# Patient Record
Sex: Female | Born: 1950
Health system: Southern US, Community
[De-identification: ages and names within clinical notes are randomized; demographics above are authoritative.]

## PROBLEM LIST (undated history)

## (undated) DIAGNOSIS — G4700 Insomnia, unspecified: Secondary | ICD-10-CM

## (undated) DIAGNOSIS — F32A Depression, unspecified: Secondary | ICD-10-CM

## (undated) DIAGNOSIS — D849 Immunodeficiency, unspecified: Secondary | ICD-10-CM

## (undated) DIAGNOSIS — R339 Retention of urine, unspecified: Secondary | ICD-10-CM

## (undated) DIAGNOSIS — R498 Other voice and resonance disorders: Secondary | ICD-10-CM

## (undated) DIAGNOSIS — K219 Gastro-esophageal reflux disease without esophagitis: Secondary | ICD-10-CM

## (undated) DIAGNOSIS — G709 Myoneural disorder, unspecified: Secondary | ICD-10-CM

## (undated) DIAGNOSIS — K449 Diaphragmatic hernia without obstruction or gangrene: Secondary | ICD-10-CM

## (undated) DIAGNOSIS — J449 Chronic obstructive pulmonary disease, unspecified: Secondary | ICD-10-CM

## (undated) DIAGNOSIS — J3802 Paralysis of vocal cords and larynx, bilateral: Secondary | ICD-10-CM

## (undated) DIAGNOSIS — G4730 Sleep apnea, unspecified: Secondary | ICD-10-CM

## (undated) DIAGNOSIS — J45909 Unspecified asthma, uncomplicated: Secondary | ICD-10-CM

## (undated) DIAGNOSIS — M810 Age-related osteoporosis without current pathological fracture: Secondary | ICD-10-CM

## (undated) DIAGNOSIS — J479 Bronchiectasis, uncomplicated: Secondary | ICD-10-CM

## (undated) DIAGNOSIS — E039 Hypothyroidism, unspecified: Secondary | ICD-10-CM

## (undated) DIAGNOSIS — J189 Pneumonia, unspecified organism: Secondary | ICD-10-CM

## (undated) DIAGNOSIS — J383 Other diseases of vocal cords: Secondary | ICD-10-CM

## (undated) DIAGNOSIS — M1990 Unspecified osteoarthritis, unspecified site: Secondary | ICD-10-CM

## (undated) DIAGNOSIS — G35 Multiple sclerosis: Secondary | ICD-10-CM

## (undated) DIAGNOSIS — C539 Malignant neoplasm of cervix uteri, unspecified: Secondary | ICD-10-CM

## (undated) HISTORY — DX: Malignant neoplasm of cervix uteri, unspecified: C53.9

## (undated) HISTORY — PX: SMALL INTESTINE SURGERY: SHX150

## (undated) HISTORY — DX: Myoneural disorder, unspecified: G70.9

## (undated) HISTORY — PX: EYE SURGERY: SHX253

## (undated) HISTORY — DX: Other diseases of vocal cords: J38.3

## (undated) HISTORY — PX: CATARACT EXTRACTION, BILATERAL: SHX1313

## (undated) HISTORY — DX: Multiple sclerosis: G35

## (undated) HISTORY — DX: Hypothyroidism, unspecified: E03.9

## (undated) HISTORY — DX: Other voice and resonance disorders: R49.8

---

## 1994-03-24 DIAGNOSIS — G35 Multiple sclerosis: Secondary | ICD-10-CM

## 1994-03-24 HISTORY — DX: Multiple sclerosis: G35

## 1999-10-09 ENCOUNTER — Ambulatory Visit (HOSPITAL_COMMUNITY): Admission: RE | Admit: 1999-10-09 | Discharge: 1999-10-09 | Payer: Self-pay | Admitting: Gastroenterology

## 1999-10-09 ENCOUNTER — Encounter: Payer: Self-pay | Admitting: Gastroenterology

## 1999-11-26 ENCOUNTER — Ambulatory Visit (HOSPITAL_COMMUNITY): Admission: RE | Admit: 1999-11-26 | Discharge: 1999-11-26 | Payer: Self-pay | Admitting: Gastroenterology

## 2000-04-17 ENCOUNTER — Encounter: Payer: Self-pay | Admitting: *Deleted

## 2000-04-17 ENCOUNTER — Encounter: Admission: RE | Admit: 2000-04-17 | Discharge: 2000-04-17 | Payer: Self-pay | Admitting: *Deleted

## 2000-06-25 ENCOUNTER — Other Ambulatory Visit: Admission: RE | Admit: 2000-06-25 | Discharge: 2000-06-25 | Payer: Self-pay | Admitting: *Deleted

## 2001-04-19 ENCOUNTER — Encounter: Admission: RE | Admit: 2001-04-19 | Discharge: 2001-04-19 | Payer: Self-pay | Admitting: *Deleted

## 2001-04-19 ENCOUNTER — Encounter: Payer: Self-pay | Admitting: *Deleted

## 2002-02-22 ENCOUNTER — Encounter: Payer: Self-pay | Admitting: Emergency Medicine

## 2002-02-22 ENCOUNTER — Emergency Department (HOSPITAL_COMMUNITY): Admission: EM | Admit: 2002-02-22 | Discharge: 2002-02-22 | Payer: Self-pay | Admitting: Emergency Medicine

## 2003-03-25 DIAGNOSIS — C539 Malignant neoplasm of cervix uteri, unspecified: Secondary | ICD-10-CM

## 2003-03-25 HISTORY — DX: Malignant neoplasm of cervix uteri, unspecified: C53.9

## 2003-03-25 HISTORY — PX: ABDOMINAL HYSTERECTOMY: SHX81

## 2003-03-25 HISTORY — PX: VESICOVAGINAL FISTULA CLOSURE W/ TAH: SUR271

## 2003-08-22 ENCOUNTER — Ambulatory Visit: Admission: RE | Admit: 2003-08-22 | Discharge: 2003-08-22 | Payer: Self-pay | Admitting: Gynecology

## 2003-08-29 ENCOUNTER — Ambulatory Visit (HOSPITAL_COMMUNITY): Admission: RE | Admit: 2003-08-29 | Discharge: 2003-08-29 | Payer: Self-pay | Admitting: Gynecology

## 2003-08-29 ENCOUNTER — Encounter (INDEPENDENT_AMBULATORY_CARE_PROVIDER_SITE_OTHER): Payer: Self-pay | Admitting: Specialist

## 2003-09-12 ENCOUNTER — Ambulatory Visit: Admission: RE | Admit: 2003-09-12 | Discharge: 2003-09-12 | Payer: Self-pay | Admitting: Gynecology

## 2003-10-25 ENCOUNTER — Inpatient Hospital Stay (HOSPITAL_COMMUNITY): Admission: RE | Admit: 2003-10-25 | Discharge: 2003-11-09 | Payer: Self-pay | Admitting: Obstetrics and Gynecology

## 2003-10-25 ENCOUNTER — Encounter (INDEPENDENT_AMBULATORY_CARE_PROVIDER_SITE_OTHER): Payer: Self-pay | Admitting: Specialist

## 2003-10-30 ENCOUNTER — Encounter: Payer: Self-pay | Admitting: Internal Medicine

## 2003-11-09 ENCOUNTER — Inpatient Hospital Stay (HOSPITAL_COMMUNITY)
Admission: RE | Admit: 2003-11-09 | Discharge: 2003-11-17 | Payer: Self-pay | Admitting: Physical Medicine & Rehabilitation

## 2003-11-15 ENCOUNTER — Ambulatory Visit: Payer: Self-pay | Admitting: Infectious Diseases

## 2003-11-21 ENCOUNTER — Encounter
Admission: RE | Admit: 2003-11-21 | Discharge: 2003-12-21 | Payer: Self-pay | Admitting: Physical Medicine & Rehabilitation

## 2004-01-24 ENCOUNTER — Other Ambulatory Visit: Admission: RE | Admit: 2004-01-24 | Discharge: 2004-01-24 | Payer: Self-pay | Admitting: Gynecology

## 2004-01-24 ENCOUNTER — Ambulatory Visit: Admission: RE | Admit: 2004-01-24 | Discharge: 2004-01-24 | Payer: Self-pay | Admitting: Gynecology

## 2004-01-24 ENCOUNTER — Encounter (INDEPENDENT_AMBULATORY_CARE_PROVIDER_SITE_OTHER): Payer: Self-pay | Admitting: *Deleted

## 2004-10-29 ENCOUNTER — Other Ambulatory Visit: Admission: RE | Admit: 2004-10-29 | Discharge: 2004-10-29 | Payer: Self-pay | Admitting: Gynecology

## 2004-10-29 ENCOUNTER — Ambulatory Visit: Admission: RE | Admit: 2004-10-29 | Discharge: 2004-10-29 | Payer: Self-pay | Admitting: Gynecology

## 2004-12-13 ENCOUNTER — Ambulatory Visit: Payer: Self-pay | Admitting: Gastroenterology

## 2005-01-09 ENCOUNTER — Ambulatory Visit: Payer: Self-pay | Admitting: Gastroenterology

## 2005-01-17 ENCOUNTER — Ambulatory Visit (HOSPITAL_COMMUNITY): Admission: RE | Admit: 2005-01-17 | Discharge: 2005-01-17 | Payer: Self-pay | Admitting: Gastroenterology

## 2005-02-19 ENCOUNTER — Ambulatory Visit: Payer: Self-pay | Admitting: Pulmonary Disease

## 2005-03-07 ENCOUNTER — Ambulatory Visit: Payer: Self-pay | Admitting: Internal Medicine

## 2005-06-03 ENCOUNTER — Ambulatory Visit: Payer: Self-pay | Admitting: Gastroenterology

## 2005-06-11 ENCOUNTER — Ambulatory Visit: Payer: Self-pay | Admitting: Gastroenterology

## 2005-06-11 ENCOUNTER — Ambulatory Visit (HOSPITAL_COMMUNITY): Admission: RE | Admit: 2005-06-11 | Discharge: 2005-06-11 | Payer: Self-pay | Admitting: Surgery

## 2005-07-16 ENCOUNTER — Ambulatory Visit: Payer: Self-pay | Admitting: Gastroenterology

## 2005-09-17 ENCOUNTER — Ambulatory Visit: Admission: RE | Admit: 2005-09-17 | Discharge: 2005-09-17 | Payer: Self-pay | Admitting: Otolaryngology

## 2005-09-17 ENCOUNTER — Encounter (INDEPENDENT_AMBULATORY_CARE_PROVIDER_SITE_OTHER): Payer: Self-pay | Admitting: *Deleted

## 2005-11-14 ENCOUNTER — Ambulatory Visit: Admission: RE | Admit: 2005-11-14 | Discharge: 2005-11-14 | Payer: Self-pay | Admitting: Gynecology

## 2005-11-14 ENCOUNTER — Other Ambulatory Visit: Admission: RE | Admit: 2005-11-14 | Discharge: 2005-11-14 | Payer: Self-pay | Admitting: Gynecology

## 2005-12-12 ENCOUNTER — Ambulatory Visit (HOSPITAL_COMMUNITY): Admission: RE | Admit: 2005-12-12 | Discharge: 2005-12-13 | Payer: Self-pay | Admitting: Otolaryngology

## 2006-04-14 ENCOUNTER — Ambulatory Visit (HOSPITAL_BASED_OUTPATIENT_CLINIC_OR_DEPARTMENT_OTHER): Admission: RE | Admit: 2006-04-14 | Discharge: 2006-04-14 | Payer: Self-pay | Admitting: Urology

## 2006-04-28 ENCOUNTER — Ambulatory Visit (HOSPITAL_BASED_OUTPATIENT_CLINIC_OR_DEPARTMENT_OTHER): Admission: RE | Admit: 2006-04-28 | Discharge: 2006-04-28 | Payer: Self-pay | Admitting: Urology

## 2006-05-06 ENCOUNTER — Ambulatory Visit: Payer: Self-pay | Admitting: Internal Medicine

## 2006-05-08 ENCOUNTER — Ambulatory Visit: Payer: Self-pay | Admitting: Internal Medicine

## 2006-05-08 LAB — CONVERTED CEMR LAB
Cholesterol: 214 mg/dL (ref 0–200)
Direct LDL: 125.3 mg/dL
HDL: 65.1 mg/dL (ref >39.0)
TSH: 8.46 microintl units/mL — ABNORMAL HIGH (ref 0.35–5.50)
Total CHOL/HDL Ratio: 3.3
Triglycerides: 76 mg/dL (ref 0–149)
VLDL: 15 mg/dL (ref 0–40)

## 2006-06-05 ENCOUNTER — Ambulatory Visit: Payer: Self-pay | Admitting: Internal Medicine

## 2006-06-05 LAB — CONVERTED CEMR LAB
Cholesterol: 178 mg/dL (ref 0–200)
HDL: 62.3 mg/dL (ref >39.0)
LDL Cholesterol: 100 mg/dL — ABNORMAL HIGH (ref 0–99)
Total CHOL/HDL Ratio: 2.9
Triglycerides: 77 mg/dL (ref 0–149)
VLDL: 15 mg/dL (ref 0–40)

## 2007-01-12 ENCOUNTER — Encounter: Admission: RE | Admit: 2007-01-12 | Discharge: 2007-01-12 | Payer: Self-pay | Admitting: Otolaryngology

## 2007-03-11 ENCOUNTER — Encounter: Payer: Self-pay | Admitting: Internal Medicine

## 2007-04-01 DIAGNOSIS — J383 Other diseases of vocal cords: Secondary | ICD-10-CM | POA: Insufficient documentation

## 2007-04-01 DIAGNOSIS — G35 Multiple sclerosis: Secondary | ICD-10-CM | POA: Insufficient documentation

## 2007-04-01 DIAGNOSIS — R498 Other voice and resonance disorders: Secondary | ICD-10-CM | POA: Insufficient documentation

## 2007-04-01 DIAGNOSIS — E038 Other specified hypothyroidism: Secondary | ICD-10-CM | POA: Insufficient documentation

## 2007-09-17 ENCOUNTER — Ambulatory Visit: Payer: Self-pay | Admitting: Internal Medicine

## 2007-09-17 DIAGNOSIS — M810 Age-related osteoporosis without current pathological fracture: Secondary | ICD-10-CM | POA: Insufficient documentation

## 2007-09-20 ENCOUNTER — Ambulatory Visit: Payer: Self-pay | Admitting: Internal Medicine

## 2007-09-21 LAB — CONVERTED CEMR LAB
ALT: 16 units/L (ref 0–35)
AST: 23 units/L (ref 0–37)
Albumin: 3.9 g/dL (ref 3.5–5.2)
Alkaline Phosphatase: 60 units/L (ref 39–117)
BUN: 16 mg/dL (ref 6–23)
Basophils Absolute: 0 10*3/uL (ref 0.0–0.1)
Basophils Relative: 1 % (ref 0.0–1.0)
Bilirubin, Direct: 0.1 mg/dL (ref 0.0–0.3)
CO2: 29 meq/L (ref 19–32)
Calcium: 9.5 mg/dL (ref 8.4–10.5)
Chloride: 105 meq/L (ref 96–112)
Cholesterol: 192 mg/dL (ref 0–200)
Creatinine, Ser: 0.7 mg/dL (ref 0.4–1.2)
Eosinophils Absolute: 0.2 10*3/uL (ref 0.0–0.7)
Eosinophils Relative: 5.4 % — ABNORMAL HIGH (ref 0.0–5.0)
GFR calc Af Amer: 112 mL/min
GFR calc non Af Amer: 92 mL/min
Glucose, Bld: 97 mg/dL (ref 70–99)
HCT: 35.8 % — ABNORMAL LOW (ref 36.0–46.0)
HDL: 66.4 mg/dL (ref >39.0)
Hemoglobin: 12.1 g/dL (ref 12.0–15.0)
LDL Cholesterol: 114 mg/dL — ABNORMAL HIGH (ref 0–99)
Lymphocytes Relative: 26.9 % (ref 12.0–46.0)
MCHC: 33.7 g/dL (ref 30.0–36.0)
MCV: 90.3 fL (ref 78.0–100.0)
Monocytes Absolute: 0.5 10*3/uL (ref 0.1–1.0)
Monocytes Relative: 14.7 % — ABNORMAL HIGH (ref 3.0–12.0)
Neutro Abs: 1.7 10*3/uL (ref 1.4–7.7)
Neutrophils Relative %: 52 % (ref 43.0–77.0)
Platelets: 267 10*3/uL (ref 150–400)
Potassium: 4.4 meq/L (ref 3.5–5.1)
RBC: 3.96 M/uL (ref 3.87–5.11)
RDW: 12.5 % (ref 11.5–14.6)
Sodium: 140 meq/L (ref 135–145)
TSH: 0.92 microintl units/mL (ref 0.35–5.50)
Total Bilirubin: 0.7 mg/dL (ref 0.3–1.2)
Total CHOL/HDL Ratio: 2.9
Total Protein: 7 g/dL (ref 6.0–8.3)
Triglycerides: 57 mg/dL (ref 0–149)
VLDL: 11 mg/dL (ref 0–40)
WBC: 3.3 10*3/uL — ABNORMAL LOW (ref 4.5–10.5)

## 2007-09-29 ENCOUNTER — Encounter (INDEPENDENT_AMBULATORY_CARE_PROVIDER_SITE_OTHER): Payer: Self-pay | Admitting: *Deleted

## 2007-10-01 ENCOUNTER — Telehealth (INDEPENDENT_AMBULATORY_CARE_PROVIDER_SITE_OTHER): Payer: Self-pay | Admitting: *Deleted

## 2007-10-01 LAB — CONVERTED CEMR LAB: Vit D, 1,25-Dihydroxy: 51 (ref 30–89)

## 2007-10-05 ENCOUNTER — Encounter: Payer: Self-pay | Admitting: Internal Medicine

## 2007-11-25 ENCOUNTER — Ambulatory Visit: Payer: Self-pay | Admitting: Internal Medicine

## 2007-11-25 ENCOUNTER — Encounter: Payer: Self-pay | Admitting: Adult Health

## 2007-11-25 ENCOUNTER — Telehealth (INDEPENDENT_AMBULATORY_CARE_PROVIDER_SITE_OTHER): Payer: Self-pay | Admitting: *Deleted

## 2007-11-25 DIAGNOSIS — N3000 Acute cystitis without hematuria: Secondary | ICD-10-CM | POA: Insufficient documentation

## 2007-11-25 DIAGNOSIS — R300 Dysuria: Secondary | ICD-10-CM | POA: Insufficient documentation

## 2007-11-25 LAB — CONVERTED CEMR LAB
Bilirubin Urine: NEGATIVE
Crystals: NEGATIVE
Ketones, ur: NEGATIVE mg/dL
Mucus, UA: NEGATIVE
Nitrite: POSITIVE — AB
Specific Gravity, Urine: <=1.005 (ref 1.000–1.03)
Urine Glucose: NEGATIVE mg/dL
Urobilinogen, UA: 0.2 (ref 0.0–1.0)
pH: 6.5 (ref 5.0–8.0)

## 2007-12-06 ENCOUNTER — Ambulatory Visit: Payer: Self-pay | Admitting: Internal Medicine

## 2007-12-06 DIAGNOSIS — R0609 Other forms of dyspnea: Secondary | ICD-10-CM | POA: Insufficient documentation

## 2007-12-06 DIAGNOSIS — R0600 Dyspnea, unspecified: Secondary | ICD-10-CM | POA: Insufficient documentation

## 2008-04-25 ENCOUNTER — Encounter: Payer: Self-pay | Admitting: Internal Medicine

## 2008-05-24 ENCOUNTER — Encounter: Payer: Self-pay | Admitting: Internal Medicine

## 2008-08-30 ENCOUNTER — Encounter: Payer: Self-pay | Admitting: Internal Medicine

## 2008-11-17 ENCOUNTER — Encounter: Admission: RE | Admit: 2008-11-17 | Discharge: 2008-11-17 | Payer: Self-pay | Admitting: Sports Medicine

## 2008-11-17 ENCOUNTER — Encounter: Payer: Self-pay | Admitting: Internal Medicine

## 2008-12-04 ENCOUNTER — Encounter: Payer: Self-pay | Admitting: Internal Medicine

## 2009-03-28 ENCOUNTER — Ambulatory Visit: Payer: Self-pay | Admitting: Internal Medicine

## 2009-03-28 LAB — CONVERTED CEMR LAB: TSH: 0.43 microintl units/mL (ref 0.35–5.50)

## 2009-03-29 LAB — CONVERTED CEMR LAB: Vit D, 25-Hydroxy: 32 ng/mL (ref 30–89)

## 2009-04-10 ENCOUNTER — Ambulatory Visit: Payer: Self-pay | Admitting: Internal Medicine

## 2009-05-15 ENCOUNTER — Encounter: Payer: Self-pay | Admitting: Internal Medicine

## 2009-09-07 ENCOUNTER — Ambulatory Visit: Payer: Self-pay | Admitting: Internal Medicine

## 2009-09-07 LAB — CONVERTED CEMR LAB: Sed Rate: 13 mm/hr (ref 0–22)

## 2009-09-10 LAB — CONVERTED CEMR LAB
ALT: 14 units/L (ref 0–35)
AST: 21 units/L (ref 0–37)
Albumin: 4.1 g/dL (ref 3.5–5.2)
Alkaline Phosphatase: 58 units/L (ref 39–117)
BUN: 14 mg/dL (ref 6–23)
Basophils Absolute: 0.1 10*3/uL (ref 0.0–0.1)
Basophils Relative: 1 % (ref 0.0–3.0)
Bilirubin, Direct: 0 mg/dL (ref 0.0–0.3)
CO2: 31 meq/L (ref 19–32)
Calcium: 9.7 mg/dL (ref 8.4–10.5)
Chloride: 103 meq/L (ref 96–112)
Creatinine, Ser: 0.7 mg/dL (ref 0.4–1.2)
Eosinophils Absolute: 0.2 10*3/uL (ref 0.0–0.7)
Eosinophils Relative: 4.3 % (ref 0.0–5.0)
GFR calc non Af Amer: 99.81 mL/min (ref >60)
Glucose, Bld: 99 mg/dL (ref 70–99)
HCT: 33.3 % — ABNORMAL LOW (ref 36.0–46.0)
Hemoglobin: 11.5 g/dL — ABNORMAL LOW (ref 12.0–15.0)
Lymphocytes Relative: 43.6 % (ref 12.0–46.0)
Lymphs Abs: 2.4 10*3/uL (ref 0.7–4.0)
MCHC: 34.6 g/dL (ref 30.0–36.0)
MCV: 88.1 fL (ref 78.0–100.0)
Monocytes Absolute: 0.6 10*3/uL (ref 0.1–1.0)
Monocytes Relative: 11.9 % (ref 3.0–12.0)
Neutro Abs: 2.1 10*3/uL (ref 1.4–7.7)
Neutrophils Relative %: 39.2 % — ABNORMAL LOW (ref 43.0–77.0)
Platelets: 267 10*3/uL (ref 150.0–400.0)
Potassium: 4.4 meq/L (ref 3.5–5.1)
Pro B Natriuretic peptide (BNP): 89 pg/mL (ref 0.0–100.0)
RBC: 3.77 M/uL — ABNORMAL LOW (ref 3.87–5.11)
RDW: 14.4 % (ref 11.5–14.6)
Sodium: 140 meq/L (ref 135–145)
TSH: 0.37 microintl units/mL (ref 0.35–5.50)
Total Bilirubin: 0.4 mg/dL (ref 0.3–1.2)
Total Protein: 6.6 g/dL (ref 6.0–8.3)
WBC: 5.4 10*3/uL (ref 4.5–10.5)

## 2009-09-11 ENCOUNTER — Encounter: Payer: Self-pay | Admitting: Internal Medicine

## 2009-10-04 ENCOUNTER — Ambulatory Visit: Payer: Self-pay | Admitting: Internal Medicine

## 2009-10-04 DIAGNOSIS — D649 Anemia, unspecified: Secondary | ICD-10-CM | POA: Insufficient documentation

## 2009-10-04 LAB — CONVERTED CEMR LAB
Basophils Absolute: 0 10*3/uL (ref 0.0–0.1)
Basophils Relative: 0.7 % (ref 0.0–3.0)
Eosinophils Absolute: 0.3 10*3/uL (ref 0.0–0.7)
Eosinophils Relative: 4.7 % (ref 0.0–5.0)
HCT: 34.1 % — ABNORMAL LOW (ref 36.0–46.0)
Hemoglobin: 11.7 g/dL — ABNORMAL LOW (ref 12.0–15.0)
Iron: 61 ug/dL (ref 42–145)
Lymphocytes Relative: 48.2 % — ABNORMAL HIGH (ref 12.0–46.0)
Lymphs Abs: 2.9 10*3/uL (ref 0.7–4.0)
MCHC: 34.4 g/dL (ref 30.0–36.0)
MCV: 89.5 fL (ref 78.0–100.0)
Monocytes Absolute: 0.8 10*3/uL (ref 0.1–1.0)
Monocytes Relative: 12.8 % — ABNORMAL HIGH (ref 3.0–12.0)
Neutro Abs: 2 10*3/uL (ref 1.4–7.7)
Neutrophils Relative %: 33.6 % — ABNORMAL LOW (ref 43.0–77.0)
Platelets: 254 10*3/uL (ref 150.0–400.0)
RBC: 3.81 M/uL — ABNORMAL LOW (ref 3.87–5.11)
RDW: 14.1 % (ref 11.5–14.6)
Saturation Ratios: 19.1 % — ABNORMAL LOW (ref 20.0–50.0)
Transferrin: 228.1 mg/dL (ref 212.0–360.0)
WBC: 6 10*3/uL (ref 4.5–10.5)

## 2009-11-21 ENCOUNTER — Encounter (INDEPENDENT_AMBULATORY_CARE_PROVIDER_SITE_OTHER): Payer: Self-pay | Admitting: *Deleted

## 2010-01-04 ENCOUNTER — Encounter (INDEPENDENT_AMBULATORY_CARE_PROVIDER_SITE_OTHER): Payer: Self-pay | Admitting: *Deleted

## 2010-02-01 ENCOUNTER — Encounter (INDEPENDENT_AMBULATORY_CARE_PROVIDER_SITE_OTHER): Payer: Self-pay | Admitting: *Deleted

## 2010-02-05 ENCOUNTER — Ambulatory Visit: Payer: Self-pay | Admitting: Gastroenterology

## 2010-02-19 ENCOUNTER — Ambulatory Visit: Payer: Self-pay | Admitting: Gastroenterology

## 2010-03-14 ENCOUNTER — Encounter: Payer: Self-pay | Admitting: Internal Medicine

## 2010-04-03 ENCOUNTER — Encounter: Payer: Self-pay | Admitting: Internal Medicine

## 2010-04-14 ENCOUNTER — Encounter: Payer: Self-pay | Admitting: Obstetrics and Gynecology

## 2010-04-25 NOTE — Letter (Signed)
Summary: [facility] Results Follow Up Letter  [facility]  [address]   [place], [place] [address]   Phone: [phone]  Fax: [phone]    09/29/2007 MRN: [id number]  Kayla [name] [address] [place], [place]  [address]  Dear [name],  We have received the results from your recent tests and have been unable to contact you.  Please call our office at [phone] so that Dr. Sherene Sires or his nurse may review the results with you.    Thank you,  Nature conservation officer Pulmonary Division

## 2010-04-25 NOTE — Letter (Signed)
Summary: [facility]  [facility]   Imported By: Sherian Rein 09/17/2009 13:24:30  _____________________________________________________________________  External Attachment:    Type:   Image     Comment:   External Document

## 2010-04-25 NOTE — Consult Note (Signed)
Summary: Return Visit/[facility]  Return Visit/[facility]   Imported By: Esmeralda Links D'jimraou 10/27/2007 12:46:35  _____________________________________________________________________  External Attachment:    Type:   Image     Comment:   External Document

## 2010-04-25 NOTE — Miscellaneous (Signed)
Summary: Orders Update pft charges  Clinical Lists Changes  Orders: Added new Service order of Carbon Monoxide diffusing w/capacity (94720) - Signed Added new Service order of Lung Volumes (94240) - Signed Added new Service order of Spirometry (Pre & Post) (94060) - Signed 

## 2010-04-25 NOTE — Letter (Signed)
Summary: Return Visit/[facility]  Return Visit/[facility]   Imported By: Sherian Rein 05/10/2008 13:10:02  _____________________________________________________________________  External Attachment:    Type:   Image     Comment:   External Document

## 2010-04-25 NOTE — Assessment & Plan Note (Signed)
Summary: ?UTI/ewj   PCP:  [name]  Chief Complaint:  Pt c/o pain and numbness x1 week.  She denies any frequency or urgency with urination.Marland Kitchen  History of Present Illness: 66 yowf never smoker with MS since Oct 96 all care through [facility] and Dr Elana Alm in [address].  returns now for follow-up of hypothyroidism denying any symptoms of cold intolerance heat intolerance changes in  energy or palpitations/ skin changes or unintended weight loss.  She continues to be able to push her mower around the yard except in the hottest of whether despite being somewhat limited by her MS.  She denies any difficulty swallowing or with cough or sob.    only new complaint is a sensation of the need to have a bowel movement immediately after meals it's been occurring the last 6 months while on high doses of Zieger per Dr. Christella Hartigan office for LPR.   November 25, 2007 --Complains of over last week has had thigh numbness, pain/increased sensivity. Urinary urgency but not significant increase. Neurologist felt she might have UTI. Todays urine is positiive for UTI (pos. nitrates/mod leuko). Mild fever 100.0. Denies chest pain, dyspnea, orthopnea, hemoptysis, fever, n/v/d, edema, dysuria, back pain, hematuria. Last UTI >10 years ago.      Prior Medications Reviewed Using: Patient Recall  Prior Medication List:  SYNTHROID 100 MCG  TABS (LEVOTHYROXINE SODIUM) once daily BONIVA 150 MG  TABS (IBANDRONATE SODIUM) 1 by mouth monthly ZEGERID 40-1100 MG  CAPS (OMEPRAZOLE-SODIUM BICARBONATE) 1 capsule by mouth two times a day GABAPENTIN 300 MG  CAPS (GABAPENTIN) Take 1 tablet by mouth three times a day RESTORIL 15 MG  CAPS (TEMAZEPAM) Take 1 tablet by mouth once a day BACLOFEN 10 MG  TABS (BACLOFEN) take 1 1/2 tabs by mouth four times a day PROZAC 40 MG  CAPS (FLUOXETINE HCL) Take 1 tablet by mouth once a day BETASERON 0.3 MG  SOLR (INTERFERON BETA-1B) injection every other day VESICARE 5 MG  TABS (SOLIFENACIN SUCCINATE) 1 by mouth  two times a day   Updated Prior Medication List: SYNTHROID 100 MCG  TABS (LEVOTHYROXINE SODIUM) once daily BONIVA 150 MG  TABS (IBANDRONATE SODIUM) 1 by mouth monthly ZEGERID 40-1100 MG  CAPS (OMEPRAZOLE-SODIUM BICARBONATE) 1 capsule by mouth two times a day GABAPENTIN 300 MG  CAPS (GABAPENTIN) Take 1 tablet by mouth three times a day RESTORIL 15 MG  CAPS (TEMAZEPAM) Take 1 tablet by mouth once a day BACLOFEN 10 MG  TABS (BACLOFEN) take 1 1/2 tabs by mouth four times a day PROZAC 40 MG  CAPS (FLUOXETINE HCL) Take 1 tablet by mouth once a day BETASERON 0.3 MG  SOLR (INTERFERON BETA-1B) injection every other day VESICARE 5 MG  TABS (SOLIFENACIN SUCCINATE) 1 by mouth two times a day CIPROFLOXACIN HCL 250 MG TABS (CIPROFLOXACIN HCL) 1 by mouth two times a day  Current Allergies (reviewed today): ! PERCOCET ! * ZENAFLEX ! DILANTIN ! 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)       HOARSENESS (ICD-784.49)           Family History:    Reviewed history from 09/17/2007 and no changes required:  Colon cancer father  Social History:    Reviewed history from 09/17/2007 and no changes required:       never smoker   Risk Factors: Tobacco use:  never   Review of Systems      See HPI   Vital Signs:  Patient Profile:   61 Years Old Female Weight:      120.2 pounds O2 Sat:      97 % O2 treatment:    Room Air Temp:     98.1 degrees F oral Pulse rate:   76 / minute BP sitting:   106 / 72  (right arm) Cuff size:   regular  Vitals Entered By: Michel Bickers CMA (November 25, 2007 11:12 AM)                 Physical Exam  pleasant healthy-appearing ambulatory white female who actually looks about 10 years younger than stated age and walks  with a bit of an awkward gait but otherwise looks very healthy Afeb with normal vital signs HEENT: nl dentition, turbinates, and orophanx. Nl external ear canals without cough reflex Neck without JVD/Nodes/TM Lungs clear to A and P bilaterally without cough on insp or exp maneuvers RRR no s3 or murmur or increase in P2 Abd soft and benign with nl excursion in the supine position. No bruits or organomegaly, CVA tenderness.  Ext warm without calf tenderness, cyanosis clubbing or edema Skin warm and dry without lesions          Impression & Recommendations:  Problem # 1:  ACUTE CYSTITIS (ICD-595.0) UA positive for nitrate/mod leuko Urine cx pending.  begin cipro 250mg  two times a day for 7 days.  Please contact office for sooner follow up if symptoms do not improve or worsen  Her updated medication list for this problem includes:    Vesicare 5 Mg Tabs (Solifenacin succinate) .Marland Kitchen... 1 by mouth two times a day    Ciprofloxacin Hcl 250 Mg Tabs (Ciprofloxacin hcl) .Marland Kitchen... 1 by mouth two times a day  Orders: Est. Patient Level III ([address])   Medications Added to Medication List This Visit: 1)  Ciprofloxacin Hcl 250 Mg Tabs (Ciprofloxacin hcl) .Marland Kitchen.. 1 by mouth two times a day  Complete Medication List: 1)  Synthroid 100 Mcg Tabs (Levothyroxine sodium) .... Once daily 2)  Boniva 150 Mg Tabs (Ibandronate sodium) .Marland Kitchen.. 1 by mouth monthly 3)  Zegerid 40-1100 Mg Caps (Omeprazole-sodium bicarbonate) .Marland Kitchen.. 1 capsule by mouth two times a day 4)  Gabapentin 300 Mg Caps (Gabapentin) .... Take 1 tablet by mouth three times a day 5)  Restoril 15 Mg Caps (Temazepam) .... Take 1 tablet by mouth once a day 6)  Baclofen 10 Mg Tabs (Baclofen) .... Take 1 1/2 tabs by mouth four times a day 7)  Prozac 40 Mg Caps (Fluoxetine hcl) .... Take 1 tablet by mouth once a day 8)  Betaseron 0.3 Mg Solr (Interferon beta-1b) .... Injection every other day 9)  Vesicare 5 Mg Tabs (Solifenacin succinate) .Marland Kitchen.. 1 by mouth two  times a day 10)  Ciprofloxacin Hcl 250 Mg Tabs (Ciprofloxacin hcl) .Marland Kitchen.. 1 by mouth two times a day  Other Orders: TLB-Udip w/ Micro (81001-URINE) T-Urine Culture (Spectrum Order) [id number])   Patient Instructions: 1)  Cipro 250mg  two times a day for 7 days 2)  Increase fluids 3)  Please contact office for sooner follow up if symptoms do not improve or worsen  4)  Urinate frequently, wipe front to back after  urination, urinate after sex.    Prescriptions: CIPROFLOXACIN HCL 250 MG TABS (CIPROFLOXACIN HCL) 1 by mouth two times a day  #14 x 0   Entered and Authorized by:   Rubye Oaks NP   Signed by:   Rubye Oaks NP on 11/25/2007   Method used:   Electronically to        [facility]* (retail)       [address]       [place], [place]  [address]       Ph: [phone]       Fax: [phone]   RxID:   [id number]  ]

## 2010-04-25 NOTE — Letter (Signed)
Summary: [facility]  [facility]   Imported By: Lanelle Bal 08/04/2008 13:43:49  _____________________________________________________________________  External Attachment:    Type:   Image     Comment:   External Document

## 2010-04-25 NOTE — Letter (Signed)
Summary: [facility] Instructions  [facility]  [address] [place], [place] [address]   Phone: [phone]  Fax: [phone]       Kayla [name]    1950/11/28    MRN: [id number]        Procedure Day /Date: Tuesday 02-19-10     Arrival Time: 9:30 a.m.     Procedure Time: 10:30 a.m.     Location of Procedure:                    _x _  [facility] (4th Floor)   PREPARATION FOR COLONOSCOPY WITH MOVIPREP  Starting 5 days prior to your procedure  02-14-10  do not eat nuts, seeds, popcorn, corn, beans, peas,  salads, or any raw vegetables.  Do not take any fiber supplements (e.g. Metamucil, Citrucel, and Benefiber).  THE DAY BEFORE YOUR PROCEDURE         DATE: 02-18-10 DAY:  Monday  1.  Drink clear liquids the entire day-NO SOLID FOOD  2.  Do not drink anything colored red or purple.  Avoid juices with pulp.  No orange juice.  3.  Drink at least 64 oz. (8 glasses) of fluid/clear liquids during the day to prevent dehydration and help the prep work efficiently.  CLEAR LIQUIDS INCLUDE: Water Jello Ice Popsicles Tea (sugar ok, no milk/cream) Powdered fruit flavored drinks Coffee (sugar ok, no milk/cream) Gatorade Juice: apple, white grape, white cranberry  Lemonade Clear bullion, consomm, broth Carbonated beverages (any kind) Strained chicken noodle soup Hard Candy                             4.  In the morning, mix first dose of MoviPrep solution:    Empty 1 Pouch A and 1 Pouch B into the disposable container    Add lukewarm drinking water to the top line of the container. Mix to dissolve    Refrigerate (mixed solution should be used within 24 hrs)  5.  Begin drinking the prep at 5:00 p.m. The MoviPrep container is divided by 4 marks.   Every 15 minutes drink the solution down to the next mark (approximately 8 oz) until the full liter is complete.   6.  Follow completed prep with 16 oz of clear liquid of your choice (Nothing red or  purple).  Continue to drink clear liquids until bedtime.  7.  Before going to bed, mix second dose of MoviPrep solution:    Empty 1 Pouch A and 1 Pouch B into the disposable container    Add lukewarm drinking water to the top line of the container. Mix to dissolve    Refrigerate  THE DAY OF YOUR PROCEDURE      DATE:  02-19-10  DAY:  Tuesday  Beginning at  5:30 a.m. (5 hours before procedure):         1. Every 15 minutes, drink the solution down to the next mark (approx 8 oz) until the full liter is complete.  2. Follow completed prep with 16 oz. of clear liquid of your choice.    3. You may drink clear liquids until  8:30 a.m. (2 HOURS BEFORE PROCEDURE).   MEDICATION INSTRUCTIONS  Unless otherwise instructed, you should take regular prescription medications with a small sip of water   as early as possible the morning of your procedure.            OTHER INSTRUCTIONS  You will need a responsible adult at least 58 years of age to accompany you and drive you home.   This person must remain in the waiting room during your procedure.  Wear loose fitting clothing that is easily removed.  Leave jewelry and other valuables at home.  However, you may wish to bring a book to read or  an iPod/MP3 player to listen to music as you wait for your procedure to start.  Remove all body piercing jewelry and leave at home.  Total time from sign-in until discharge is approximately 2-3 hours.  You should go home directly after your procedure and rest.  You can resume normal activities the  day after your procedure.  The day of your procedure you should not:   Drive   Make legal decisions   Operate machinery   Drink alcohol   Return to work  You will receive specific instructions about eating, activities and medications before you leave.    The above instructions have been reviewed and explained to me by   _______________________    I fully understand and can verbalize  these instructions _____________________________ Date _________

## 2010-04-25 NOTE — Miscellaneous (Signed)
Summary: Luv2stamp7  Clinical Lists Changes  Medications: Added new medication of MOVIPREP 100 GM  SOLR (PEG-KCL-NACL-NASULF-NA ASC-C) As per prep instructions. - Signed Rx of MOVIPREP 100 GM  SOLR (PEG-KCL-NACL-NASULF-NA ASC-C) As per prep instructions.;  #1 x 0;  Signed;  Entered by: Ezra Sites RN;  Authorized by: Meryl Dare MD Clementeen Graham;  Method used: Electronically to [facility]*, [address], [place], [place]  [id number], Ph: [phone], Fax: [phone] Allergies: Changed allergy or adverse reaction from PERCOCET to PERCOCET Changed allergy or adverse reaction from * ZENAFLEX to * ZENAFLEX Changed allergy or adverse reaction from DILANTIN to DILANTIN Changed allergy or adverse reaction from TYLOX to [facility]    Prescriptions: MOVIPREP 100 GM  SOLR (PEG-KCL-NACL-NASULF-NA ASC-C) As per prep instructions.  #1 x 0   Entered by:   Ezra Sites RN   Authorized by:   Meryl Dare MD [facility]   Signed by:   Ezra Sites RN on 02/05/2010   Method used:   Electronically to        [facility]* (retail)       [address]       [place], [place]  [address]       Ph: [phone]       Fax: [phone]   RxID:   [id number]

## 2010-04-25 NOTE — Letter (Signed)
Summary: [facility]  [facility]   Imported By: Sherian Rein 03/22/2010 10:05:40  _____________________________________________________________________  External Attachment:    Type:   Image     Comment:   External Document

## 2010-04-25 NOTE — Consult Note (Signed)
Summary: [facility]  [facility]   Imported By: Esmeralda Links D'jimraou 04/29/2007 13:15:26  _____________________________________________________________________  External Attachment:    Type:   Image     Comment:   External Document

## 2010-04-25 NOTE — Procedures (Signed)
Summary: Colonoscopy  Patient: Kayla [name] Note: All result statuses are Final unless otherwise noted.  Tests: (1) Colonoscopy (COL)   COL Colonoscopy           DONE     [facility]     [address]     [place], [place]  [address]           COLONOSCOPY PROCEDURE REPORT     PATIENT:  Kayla [name], Kayla [name]  MR#:  [id number]     BIRTHDATE:  1951-03-18, 59 yrs. old  GENDER:  female     ENDOSCOPIST:  Judie Petit T. Russella Dar, MD, [facility]           PROCEDURE DATE:  02/19/2010     PROCEDURE:  Colonoscopy [address]     ASA CLASS:  Class II     INDICATIONS:  1) Elevated Risk Screening  2) family history of     colon cancer: father at 15.     MEDICATIONS:   Fentanyl 75 mcg IV, Versed 9 mg IV     DESCRIPTION OF PROCEDURE:   After the risks benefits and     alternatives of the procedure were thoroughly explained, informed     consent was obtained.  Digital rectal exam was performed and     revealed no abnormalities.   The LB [id number] endoscope     was introduced through the anus and advanced to the cecum, which     was identified by both the appendix and ileocecal valve, without     limitations.  The quality of the prep was good, using MoviPrep.     The instrument was then slowly withdrawn as the colon was fully     examined.     <<PROCEDUREIMAGES>>     FINDINGS:  A normal appearing cecum, ileocecal valve, and     appendiceal orifice were identified. The ascending, hepatic     flexure, transverse, splenic flexure, descending, sigmoid colon,     and rectum appeared unremarkable. Retroflexed views in the rectum     revealed no abnormalities. The time to cecum =  4.33  minutes. The     scope was then withdrawn (time =  10.25  min) from the patient and     the procedure completed.           COMPLICATIONS:  None           ENDOSCOPIC IMPRESSION:     1) Normal colon           RECOMMENDATIONS:     1) Repeat Colonoscopy in 5 years.           Venita Lick. Russella Dar, MD, Clementeen Graham           n.  eSIGNED:   Venita Lick. D[place] Ament at 02/19/2010 10:59 AM           Sherrie Mustache, [id number]  Note: An exclamation mark (!) indicates a result that was not dispersed into the flowsheet. Document Creation Date: 02/19/2010 11:00 AM _______________________________________________________________________  (1) Order result status: Final Collection or observation date-time: 02/19/2010 10:56 Requested date-time:  Receipt date-time:  Reported date-time:  Referring Physician:   Ordering Physician: Claudette Head [phone]) Specimen Source:  Source: Launa Grill Order Number: [id number] Lab site:   Appended Document: Colonoscopy    Clinical Lists Changes  Observations: Added new observation of COLONNXTDUE: 01/2015 (02/19/2010 13:45)

## 2010-04-25 NOTE — Assessment & Plan Note (Signed)
Summary: Pulmonary/ ext ov with hfa teaching 50%   Primary Provider/Referring Provider:  Sherene Sires  CC:  Followup with PFT's.  Pt states that her breathing is no better since last seen in June for acute visit.  She states she is still wheezing- proventil helps and is using this up to tid.Marland Kitchen  History of Present Illness: 28 yowf never smoker with MS since Oct 96 all care through [facility] and Dr Elana Alm in [place].   March 28, 2009 Followup.  Pt c/o fatigue and hair loss x 6 months.  no change weakness and mild ataxia related to ms.   September 07, 2009 --Presents for work in visit. Pt complains over last 3 months she wears out easily, gets more dyspneic easily. Dyspnea comes on intermittently sometimes at rest, and w/ activity. She can hear herself wheezing. If she overworks herself she can hear wheezing.   October 04, 2009 Followup with PFT's.  Pt states that her breathing is no better since last seen in June for acute visit.  She states she is still wheezing- ventolin  helps and is using this up to tid. w/u c/w anemia  Mild dysphagia no more than usual.  occ waking up wheezing at night despite zegerid at hs. no sinus or overt hb symptoms.  Pt denies any significant sore throat, dysphagia, itching, sneezing,  nasal congestion or excess secretions,  fever, chills, sweats, unintended wt loss, pleuritic or exertional cp, hempoptysis, change in activity tolerance  orthopnea pnd or leg swelling Pt also denies any obvious fluctuation in symptoms with weather or environmental change or other alleviating or aggravating factors.         Current Medications (verified): 1)  Synthroid 100 Mcg  Tabs (Levothyroxine Sodium) .... Once Daily 2)  Zegerid 40-1100 Mg  Caps (Omeprazole-Sodium Bicarbonate) .Marland Kitchen.. 1 Capsule By Mouth Two Times A Day 3)  Gabapentin 300 Mg  Caps (Gabapentin) .... Take 1 Tablet By Mouth Three Times A Day 4)  Restoril 15 Mg  Caps (Temazepam) .... Take 1 Tablet By Mouth Once A Day As Needed 5)  Baclofen 20  Mg Tabs (Baclofen) .Marland Kitchen.. 1 1/2 Tabs Four Times A Day 6)  Prozac 40 Mg  Caps (Fluoxetine Hcl) .... Take 1 Tablet By Mouth Once A Day 7)  Tysabri 300 Mg/27ml Conc (Natalizumab) .Marland Kitchen.. 1 Infusion Monthly 8)  Naproxen 500 Mg Tabs (Naproxen) .... Take 1 Tablet By Mouth Two Times A Day W/ Meals 9)  Proventil Hfa 108 (90 Base) Mcg/act Aers (Albuterol Sulfate) .... 2 Puffs Every 4-6 Hours As Needed  Allergies (verified): 1)  ! Percocet 2)  ! * Zenaflex 3)  ! Dilantin 4)  ! 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 asthma     - HFA 50 % after coaching October 04, 2009     - PFT's October 04, 2009 FEV1 2.15 (95%) with ratio 69 and DLCO 58> corrects to 98, mild insp truncation  Health Maintenance.............................................Marland KitchenWert     - Pneumovax March 28, 2009      - Td due March 30, 2009   Vital Signs:  Patient profile:   60 year old female Weight:      128 pounds O2 Sat:      99 % on Room air Temp:     97.9 degrees F oral Pulse rate:   78 /  minute BP sitting:   108 / 62  (left arm)  Vitals Entered By: Vernie Murders (October 04, 2009 10:53 AM)  O2 Flow:  Room air  Physical Exam  Additional Exam:   ambulatory white female who actually looks about 10 years younger than stated age and walks with a bit of an awkward gait but otherwise looks very healthy,   wt 120 -> 118  > 127 March 28, 2009 >129 June 17 > 128 October 04, 2009  HEENT: nl dentition, turbinates, and orophanx. Nl external ear canals without cough reflex Neck without JVD/Nodes/TM Lungs pseudowheeze resolves with purse lip maneuver, minimal insp stridor RRR no s3 or murmur or increase in P2 Abd soft and benign with nl excursion in the supine position. No bruits or  organomegaly Ext warm without calf tenderness, cyanosis clubbing or edema Skin warm and dry without lesions       Impression & Recommendations:  Problem # 1:  DYSPNEA (ICD-786.09)  Her updated medication list for this problem includes:    Proventil Hfa 108 (90 Base) Mcg/act Aers (Albuterol sulfate) .Marland Kitchen... 2 puffs every 4-6 hours as needed    Dulera 100-5 Mcg/act Aero (Mometasone furo-formoterol fum) .Marland Kitchen... 2 puffs first thing  in am and 2 puffs again in pm about 12 hours later  Improvement p saba suggests asthma which is supported by pft's in terms of borderline airflow obst, try dulera  I spent extra time with the patient today explaining optimal mdi  technique.  This improved from  25-50%  I discussed the importance of understanding the goals of asthma therapy including the rule of 2's as it applies to the use of a rescue inhaler.   Problem # 2:  HOARSENESS (JYN-829.56)  f/u with [name] if not improving with rx of gerd  Orders: Est. Patient Level IV (21308)  Problem # 3:  ANEMIA (ICD-285.9)  Orders: TLB-IBC Pnl (Iron/FE;Transferrin) (83550-IBC) TLB-CBC Platelet - w/Differential (85025-CBCD) Est. Patient Level IV (65784)  very mild and c/w chronic dz, not fe def  Medications Added to Medication List This Visit: 1)  Proventil Hfa 108 (90 Base) Mcg/act Aers (Albuterol sulfate) .... 2 puffs every 4-6 hours as needed 2)  Dulera 100-5 Mcg/act Aero (Mometasone furo-formoterol fum) .... 2 puffs first thing  in am and 2 puffs again in pm about 12 hours later  Patient Instructions: 1)  dulera 100/5 2 puffs first thing  in am and 2 puffs again in pm about 12 hours later and then ventolin as needed up to every 4 hours with the goal of not needing it more twice weekly 2)  GERD (REFLUX)  is a common cause of respiratory symptoms. It commonly presents without heartburn and can be treated with medication, but also with lifestyle changes including avoidance of late meals, excessive alcohol,  smoking cessation, and avoid fatty foods, chocolate, peppermint, colas, red wine, and acidic juices such as orange juice. NO MINT OR MENTHOL PRODUCTS SO NO COUGH DROPS  3)  USE SUGARLESS CANDY INSTEAD (jolley ranchers)  4)  NO OIL BASED VITAMINS  5)  if not better see Dr [name] re throat wheezing Prescriptions: DULERA 100-5 MCG/ACT AERO (MOMETASONE FURO-FORMOTEROL FUM) 2 puffs first thing  in am and 2 puffs again in pm about 12 hours later  #1 x 11   Entered and Authorized by:   [name] MD   Signed by:   [name] MD on 10/04/2009   Method used:   Print then Give  to Patient   RxID:   [id number]

## 2010-04-25 NOTE — Assessment & Plan Note (Signed)
Summary: pt needs TDAP inj per MW/lmr   Nurse Visit   Allergies: 1)  ! Percocet 2)  ! * Zenaflex 3)  ! Dilantin 4)  ! Tylox  Immunizations Administered:  DPT Vaccine # 1:    Vaccine Type: DPT    Site: right deltoid    Mfr: GlaxoSmithKline    Dose: 0.5 ml    Route: IM    Given by: Tammy Scott     Exp. Date: 09/28/2010    Lot #: [id number]    VIS given: 08/07/05 version given April 10, 2009.  Orders Added: 1)  DPT Vaccine [90701] 2)  Admin 1st Vaccine [09811]

## 2010-04-25 NOTE — Assessment & Plan Note (Signed)
Summary: Primary svc/ acute eval cough/sob   PCP:  [name]  Chief Complaint:  Acute visit.  Pt c/o increased sob and hoarsness x 2 days.  She states that she feels alot of congestion in her throat and but denies any cough.  She states that she relates these problems to "over doing it".  She has been in the process of moving and painting the inside of her house.Kayla [name]  History of Present Illness: 60 yowf never smoker with MS since Oct 96 all care through [facility] and Dr Elana Alm in [place].  returns now for follow-up of hypothyroidism denying any symptoms of cold intolerance heat intolerance changes in  energy or palpitations/ skin changes or unintended weight loss.  She continues to be able to push her mower around the yard except in the hottest of whether despite being somewhat limited by her MS.  She denies any difficulty swallowing or with cough or sob.    only new complaint is a sensation of the need to have a bowel movement immediately after meals it's been occurring the last 6 months while on high doses of Zieger per Dr. Christella Hartigan office for LPR.   November 25, 2007 --Complains of over last week has had thigh numbness, pain/increased sensivity. Urinary urgency but not significant increase. Neurologist felt she might have UTI. Todays urine is positiive for UTI (pos. nitrates/mod leuko). Mild fever 100.0. Denies chest pain, dyspnea, orthopnea, hemoptysis, fever, n/v/d, edema, dysuria, back pain, hematuria. Last UTI >10 years ago. rx with cipro but pt perceived no benefit.  December 06, 2007  ov fu with new difficulty with watery rhinitis, sorethroat since 9/11 and sob with talking , no purulent sputum no fever.  9/13 noted increased sob and pain in center /front with deep breathing, no laterallizing pain, no ha or muscle aches or dysphagia n or v     Updated Prior Medication List: SYNTHROID 100 MCG  TABS (LEVOTHYROXINE SODIUM) once daily BONIVA 150 MG  TABS (IBANDRONATE SODIUM) 1 by mouth monthly ZEGERID  40-1100 MG  CAPS (OMEPRAZOLE-SODIUM BICARBONATE) 1 capsule by mouth two times a day GABAPENTIN 300 MG  CAPS (GABAPENTIN) Take 1 tablet by mouth three times a day RESTORIL 15 MG  CAPS (TEMAZEPAM) Take 1 tablet by mouth once a day BACLOFEN 10 MG  TABS (BACLOFEN) take 1 1/2 tabs by mouth four times a day PROZAC 40 MG  CAPS (FLUOXETINE HCL) Take 1 tablet by mouth once a day BETASERON 0.3 MG  SOLR (INTERFERON BETA-1B) injection every other day VESICARE 5 MG  TABS (SOLIFENACIN SUCCINATE) 1 by mouth two times a day  Current Allergies (reviewed today): ! PERCOCET ! * ZENAFLEX ! DILANTIN ! TYLOX  Past Medical History:        MULTIPLE SCLEROSIS (ICD-340)      -  dx 10/96    HYPOTHYROIDISM (ICD-244.9)    OTHER DISEASES OF VOCAL CORDS (ICD-478.5)    HOARSENESS (ICD-784.49)        Family History:    Reviewed history from 09/17/2007 and no changes required:         Colon cancer father  Social History:    Reviewed history from 09/17/2007 and no changes required:       never smoker    Review of Systems  The patient denies anorexia, fever, weight loss, weight gain, vision loss, decreased hearing, syncope, peripheral edema, headaches, hemoptysis, abdominal pain, melena, hematochezia, severe indigestion/heartburn, hematuria, incontinence, muscle weakness, suspicious skin lesions, transient blindness, difficulty walking, depression, unusual weight  change, abnormal bleeding, and enlarged lymph nodes.     Vital Signs:  Patient Profile:   60 Years Old Female Weight:      118.38 pounds O2 Sat:      98 % O2 treatment:    Room Air Temp:     98.1 degrees F oral Pulse rate:   76 / minute BP sitting:   118 / 66  (left arm)  Vitals Entered By: Vernie Murders (December 06, 2007 10:59 AM)                 Physical Exam   ambulatory white female who actually looks about 10 years younger than stated age and walks with a bit of an awkward gait but otherwise looks very healthy, classic voice  fatigue with minimal congested cough  wt 120 -> 118 Afeb with normal vital signs HEENT: nl dentition, turbinates, and orophanx. Nl external ear canals without cough reflex Neck without JVD/Nodes/TM Lungs clear to A and P bilaterally without cough on insp or exp maneuvers RRR no s3 or murmur or increase in P2 Abd soft and benign with nl excursion in the supine position. No bruits or organomegaly, CVA tenderness.  Ext warm without calf tenderness, cyanosis clubbing or edema Skin warm and dry without lesions       CXR  Procedure date:  12/02/2007  Findings:      no infiltrates     Impression & Recommendations:  Problem # 1:  DYSPNEA (ICD-786.09) acute flareup of dyspnea associated with evidence of pseudo-wheeze and vocal cord dysfunction of unclear etiology.  Differential diagnosis includes nonspecific rhinitis, allergic rhinitis, and URI but note the absence of fever or purulent secretions so this could all be a GERD.    andHe is to andRecommended attention to diet, PPI, and short course of prednisone but no antibiotics at this point.  I suspect a component of her problem is related to muscle tone in the upper airway from multiple sclerosis and therefore recommended follow-up at [facility] if not responding to the above measures.   Medications Added to Medication List This Visit: 1)  Prednisone 10 Mg Tabs (Prednisone) .... 4 each am x 2days, 2x2days, 1x2days and stop 2)  Tramadol Hcl 50 Mg Tabs (Tramadol hcl) .... One every 4 hours for cough or painful cough  Complete Medication List: 1)  Synthroid 100 Mcg Tabs (Levothyroxine sodium) .... Once daily 2)  Boniva 150 Mg Tabs (Ibandronate sodium) .Kayla [name].. 1 by mouth monthly 3)  Zegerid 40-1100 Mg Caps (Omeprazole-sodium bicarbonate) .Kayla [name].. 1 capsule by mouth two times a day 4)  Gabapentin 300 Mg Caps (Gabapentin) .... Take 1 tablet by mouth three times a day 5)  Restoril 15 Mg Caps (Temazepam) .... Take 1 tablet by mouth once a day 6)   Baclofen 10 Mg Tabs (Baclofen) .... Take 1 1/2 tabs by mouth four times a day 7)  Prozac 40 Mg Caps (Fluoxetine hcl) .... Take 1 tablet by mouth once a day 8)  Betaseron 0.3 Mg Solr (Interferon beta-1b) .... Injection every other day 9)  Vesicare 5 Mg Tabs (Solifenacin succinate) .Kayla [name].. 1 by mouth two times a day 10)  Prednisone 10 Mg Tabs (Prednisone) .... 4 each am x 2days, 2x2days, 1x2days and stop 11)  Tramadol Hcl 50 Mg Tabs (Tramadol hcl) .... One every 4 hours for cough or painful cough   Patient Instructions: 1)  Chicken noodle soup and Robitussin DM 2)  GERD (gastroesophageal reflux disease) was discussed. It is a common  cause of respiratory symptoms. It commonly presents in the absence of heartburn. GERD can be treated with medication, but also with lifestyle changes including avoidance of late meals, excessive alcohol, smoking cessation, and avoid fatty foods, chocolate, peppermint, colas, red wine, and acidic juices such as orange juice. NO MINT OR MENTHOL PRODUCTS 3)  USE SUGARLESS CANDY INSTEAD (jolley ranchers)  4)  Go to [facility] ER if condition worsens   Prescriptions: TRAMADOL HCL 50 MG  TABS (TRAMADOL HCL) One every 4 hours for cough or painful cough  #40 x 0   Entered and Authorized by:   [name] MD   Signed by:   [name] MD on 12/06/2007   Method used:   Electronically to        [facility]* (retail)       [address]       [place], [place]  [id number]       Ph: [phone]       Fax: [phone]   RxID:   [id number] PREDNISONE 10 MG  TABS (PREDNISONE) 4 each am x 2days, 2x2days, 1x2days and stop  #14 x 0   Entered and Authorized by:   [name] MD   Signed by:   [name] MD on 12/06/2007   Method used:   Electronically to        [facility]* (retail)       [address]       [place], [place]  [id number]       Ph: [phone]       Fax: [phone]   RxID:   [id number]  ]

## 2010-04-25 NOTE — Consult Note (Signed)
Summary: [facility]  [facility]   Imported By: Lester Follansbee 05/21/2009 08:18:06  _____________________________________________________________________  External Attachment:    Type:   Image     Comment:   External Document

## 2010-04-25 NOTE — Assessment & Plan Note (Signed)
Summary: Acute NP office visit - SOB, wheezing   Primary Provider/Referring Provider:  Sherene Sires  CC:  increased SOB at rest and w/ activity, wheezing x2-3 months  - denies cough, chest congestion, and f/c/s.  History of Present Illness: 27 yowf never smoker with MS since Oct 96 all care through [facility] and Dr Elana Alm in [place].   March 28, 2009 Followup.  Pt c/o fatigue and hair loss x 6 months.  no change weakness and mild ataxia related to ms.   September 07, 2009 --Presents for work in visit. Pt complains over last 3 months she wears out easily, gets more dyspneic easily. Dyspnea comes on intermittently sometimes at rest, and w/ activity. She can hear herself wheezing. If she overworks herself she can hear wheezing.  Has no associated cough, discolored mucus, sinus congestion, chest pain or palpitations, calf pain, or edema . No new meds or MS treatments.     Medications Prior to Update: 1)  Synthroid 100 Mcg  Tabs (Levothyroxine Sodium) .... Once Daily 2)  Zegerid 40-1100 Mg  Caps (Omeprazole-Sodium Bicarbonate) .Marland Kitchen.. 1 Capsule By Mouth Two Times A Day 3)  Gabapentin 300 Mg  Caps (Gabapentin) .... Take 1 Tablet By Mouth Three Times A Day 4)  Restoril 15 Mg  Caps (Temazepam) .... Take 1 Tablet By Mouth Once A Day 5)  Baclofen 20 Mg Tabs (Baclofen) .Marland Kitchen.. 1 1/2 Tabs Four Times A Day 6)  Prozac 40 Mg  Caps (Fluoxetine Hcl) .... Take 1 Tablet By Mouth Once A Day 7)  Tysabri 300 Mg/37ml Conc (Natalizumab) .Marland Kitchen.. 1 Infusion Monthly 8)  Meloxicam 7.5 Mg Tabs (Meloxicam) .Marland Kitchen.. 1 Tablet Once Daily With Food  Current Medications (verified): 1)  Synthroid 100 Mcg  Tabs (Levothyroxine Sodium) .... Once Daily 2)  Zegerid 40-1100 Mg  Caps (Omeprazole-Sodium Bicarbonate) .Marland Kitchen.. 1 Capsule By Mouth Two Times A Day 3)  Gabapentin 300 Mg  Caps (Gabapentin) .... Take 1 Tablet By Mouth Three Times A Day 4)  Restoril 15 Mg  Caps (Temazepam) .... Take 1 Tablet By Mouth Once A Day As Needed 5)  Baclofen 20 Mg Tabs (Baclofen)  .Marland Kitchen.. 1 1/2 Tabs Four Times A Day 6)  Prozac 40 Mg  Caps (Fluoxetine Hcl) .... Take 1 Tablet By Mouth Once A Day 7)  Tysabri 300 Mg/43ml Conc (Natalizumab) .Marland Kitchen.. 1 Infusion Monthly 8)  Naproxen 500 Mg Tabs (Naproxen) .... Take 1 Tablet By Mouth Two Times A Day W/ Meals  Allergies (verified): 1)  ! Percocet 2)  ! * Zenaflex 3)  ! Dilantin 4)  ! Tylox  Past History:  Past Medical History: Last updated: 03/28/2009 MULTIPLE SCLEROSIS (ICD-340)........................Marland KitchenLeotis Shames [facility], was at [facility])   -  dx 10/96 HYPOTHYROIDISM (ICD-244.9) OTHER DISEASES OF VOCAL CORDS (ICD-478.5) HOARSENESS (ICD-784.49) Health Maintenance.............................................Marland KitchenWert     - Pneumovax March 28, 2009      - Td due March 30, 2009   Family History: Last updated: 09/17/2007   Colon cancer father  Social History: Last updated: 09/07/2009 never smoker rare alcohol 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  Risk Factors: Smoking Status: never (04/01/2007)  Social History: never smoker rare alcohol 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 No Comments Medications reviewed with patient Daytime contact number verified with patient. Boone Master CNA/MA  September 07, 2009 3:21 PM    Physical Exam  Additional Exam:   ambulatory white female who actually looks about 10 years younger than stated age and walks with a bit of an awkward gait but otherwise looks  very healthy,   wt 120 -> 118  > 127 March 28, 2009 >129 June 17  HEENT: nl dentition, turbinates, and orophanx. Nl external ear canals without cough reflex Neck without JVD/Nodes/TM Lungs clear to A and P bilaterally without cough on insp or exp maneuvers RRR no s3 or murmur or increase in P2 Abd soft and benign with nl excursion in the supine position. No bruits or organomegaly Ext warm without calf tenderness, cyanosis clubbing or edema Skin warm and dry without lesions       Impression & Recommendations:  Problem # 1:  DYSPNEA (ICD-786.09) ? etiology , her O2 sats are 99%, she has miinimal associated symptoms. She is on no new meds.  will start w/ work up.  look at chest xray , and labs  have return for follow up and PFTs.   Orders: T-2 View CXR (71020TC) TLB-Sedimentation Rate (ESR) (85652-ESR) TLB-CBC Platelet - w/Differential (85025-CBCD) TLB-BMP (Basic Metabolic Panel-BMET) (80048-METABOL) TLB-TSH (Thyroid Stimulating Hormone) (84443-TSH) TLB-Hepatic/Liver Function Pnl (80076-HEPATIC) TLB-BNP (B-Natriuretic Peptide) (83880-BNPR)  Medications Added to Medication List This Visit: 1)  Restoril 15 Mg Caps (Temazepam) .... Take 1 tablet by mouth once a day as needed 2)  Naproxen 500 Mg Tabs (Naproxen) .... Take 1 tablet by mouth two times a day w/ meals  Complete Medication List: 1)  Synthroid 100 Mcg Tabs (Levothyroxine sodium) .... Once daily 2)  Zegerid 40-1100 Mg Caps (Omeprazole-sodium bicarbonate) .Marland Kitchen.. 1 capsule by mouth two times a day 3)  Gabapentin 300 Mg Caps (Gabapentin) .... Take 1 tablet by mouth three times a day 4)  Restoril 15 Mg Caps (Temazepam) .... Take 1 tablet by mouth once a day as needed 5)  Baclofen 20 Mg Tabs (Baclofen) .Marland Kitchen.. 1 1/2 tabs four times a day 6)  Prozac 40 Mg Caps (Fluoxetine hcl) .... Take 1 tablet by mouth once a day 7)  Tysabri 300 Mg/42ml Conc (Natalizumab) .Marland Kitchen.. 1 infusion monthly 8)  Naproxen 500 Mg Tabs (Naproxen) .... Take 1  tablet by mouth two times a day w/ meals  Patient Instructions: 1)  I am checking a xray today  and labs.  2)  I will call with results.  3)  follow up Dr. Sherene Sires in 1-2 weeks /w PFTs , will go over labs/xray in detail. 4)  Please contact office for sooner follow up if symptoms do not improve or worsen     Appended Document: Orders Update     Clinical Lists Changes  Orders: Added new Service order of Est. Patient Level V (65784) - Signed

## 2010-04-25 NOTE — Letter (Signed)
Summary: Colonoscopy Letter  [facility]  [address] [place], [place] [address]   Phone: [phone]  Fax: [phone]      November 21, 2009 MRN: [id number]   Kayla [name] [address] [place], [place]  [address]   Dear [name],   According to your medical record, it is time for you to schedule a Colonoscopy. The American Cancer Society recommends this procedure as a method to detect early colon cancer. Patients with a family history of colon cancer, or a personal history of colon polyps or inflammatory bowel disease are at increased risk.  This letter has beeen generated based on the recommendations made at the time of your procedure. If you feel that in your particular situation this may no longer apply, please contact our office.  Please call our office at [phone] to schedule this appointment or to update your records at your earliest convenience.  Thank you for cooperating with [place] to provide you with the very best care possible.   Sincerely,  Judie Petit T. Russella Dar, M.D.  [facility] [phone]

## 2010-04-25 NOTE — Assessment & Plan Note (Signed)
Summary: Primary svc/ f/u hypothyroidism    Primary Provider/Referring Provider:  Sherene Sires  CC:  Followup.  Pt c/o fatigue and hair loss x 6 months.  She states that .  History of Present Illness: 60 yowf never smoker with MS since Oct 96 all care through [facility] and Dr Elana Alm in [place].  returns now for follow-up of hypothyroidism denying any symptoms of cold intolerance heat intolerance changes in  energy or palpitations/ skin changes or unintended weight loss.  She continues to be able to push her mower around the yard except in the hottest of whether despite being somewhat limited by her MS.  She denies any difficulty swallowing or with cough or sob.    only new complaint is a sensation of the need to have a bowel movement immediately after meals it's been occurring the last 6 months while on high doses of Zieger per Dr. Christella Hartigan office for LPR.   November 25, 2007 --Complains of over last week has had thigh numbness, pain/increased sensivity. Urinary urgency but not significant increase. Neurologist felt she might have UTI. Todays urine is positiive for UTI (pos. nitrates/mod leuko). Mild fever 100.0. Denies chest pain, dyspnea, orthopnea, hemoptysis, fever, n/v/d, edema, dysuria, back pain, hematuria. Last UTI >10 years ago. rx with cipro but pt perceived no benefit.  December 06, 2007  ov fu with new difficulty with watery rhinitis, sorethroat since 9/11 and sob with talking , no purulent sputum no fever.  9/13 noted increased sob and pain in center /front with deep breathing, no laterallizing pain, no ha or muscle aches or dysphagia n or v.  March 28, 2009 Followup.  Pt c/o fatigue and hair loss x 6 months.  no change weakness and mild ataxia related to ms. Pt denies any significant sore throat, dysphagia, itching, sneezing,  nasal congestion or excess secretions,  fever, chills, sweats, unintended wt loss, pleuritic or exertional cp, hempoptysis, change in activity tolerance  orthopnea pnd or  leg swelling   Current Medications (verified): 1)  Synthroid 100 Mcg  Tabs (Levothyroxine Sodium) .... Once Daily 2)  Zegerid 40-1100 Mg  Caps (Omeprazole-Sodium Bicarbonate) .Marland Kitchen.. 1 Capsule By Mouth Two Times A Day 3)  Gabapentin 300 Mg  Caps (Gabapentin) .... Take 1 Tablet By Mouth Three Times A Day 4)  Restoril 15 Mg  Caps (Temazepam) .... Take 1 Tablet By Mouth Once A Day 5)  Baclofen 20 Mg Tabs (Baclofen) .Marland Kitchen.. 1 1/2 Tabs Four Times A Day 6)  Prozac 40 Mg  Caps (Fluoxetine Hcl) .... Take 1 Tablet By Mouth Once A Day 7)  Tysabri 300 Mg/35ml Conc (Natalizumab) .Marland Kitchen.. 1 Infusion Monthly 8)  Meloxicam 7.5 Mg Tabs (Meloxicam) .Marland Kitchen.. 1 Tablet Once Daily With Food  Allergies (verified): 1)  ! Percocet 2)  ! * Zenaflex 3)  ! Dilantin 4)  ! Tylox  Past History:  Past Medical History: MULTIPLE SCLEROSIS (ICD-340)........................Marland KitchenLeotis Shames [facility], was at [facility])   -  dx 10/96 HYPOTHYROIDISM (ICD-244.9) OTHER DISEASES OF VOCAL CORDS (ICD-478.5) HOARSENESS (ICD-784.49) Health Maintenance.............................................Marland KitchenWert     - Pneumovax March 28, 2009      - Td due March 30, 2009   Vital Signs:  Patient profile:   60 year old female Height:      64 inches Weight:      127 pounds BMI:     21.88 O2 Sat:      95 % on Room air Temp:     97.9 degrees F oral Pulse  rate:   77 / minute BP sitting:   112 / 70  (left arm)  Vitals Entered By: Vernie Murders (March 28, 2009 9:53 AM)  O2 Flow:  Room air  Physical Exam  Additional Exam:   ambulatory white female who actually looks about 10 years younger than stated age and walks with a bit of an awkward gait but otherwise looks very healthy, classic voice fatigue with minimal congested cough  wt 120 -> 118  > 127 March 28, 2009  HEENT: nl dentition, turbinates, and orophanx. Nl external ear canals without cough reflex Neck without JVD/Nodes/TM Lungs clear to A and P bilaterally without cough on insp or exp  maneuvers RRR no s3 or murmur or increase in P2 Abd soft and benign with nl excursion in the supine position. No bruits or organomegaly, CVA tenderness.  Ext warm without calf tenderness, cyanosis clubbing or edema Skin warm and dry without lesions       Impression & Recommendations:  Problem # 1:  HYPOTHYROIDISM (ICD-244.9)  Her updated medication list for this problem includes:    Synthroid 100 Mcg Tabs (Levothyroxine sodium) ..... Once daily  Orders: TLB-TSH (Thyroid Stimulating Hormone) (84443-TSH) Est. Patient Level III (11914)  Labs Reviewed:   TSH: 0.92 (09/20/2007)   >  .43 March 28, 2009  ok on rx Chol: 192 (09/20/2007)   HDL: 66.4 (09/20/2007)   LDL: 114 (09/20/2007)   TG: 57 (09/20/2007)  Problem # 2:  MULTIPLE SCLEROSIS (ICD-340) per Dr Leotis Shames  Problem # 3:  OSTEOPENIA (ICD-733.90) Vit d ok at 32 on present rx, keep up the wt bearing ex  Medications Added to Medication List This Visit: 1)  Baclofen 20 Mg Tabs (Baclofen) .Marland Kitchen.. 1 1/2 tabs four times a day 2)  Tysabri 300 Mg/18ml Conc (Natalizumab) .Marland Kitchen.. 1 infusion monthly 3)  Meloxicam 7.5 Mg Tabs (Meloxicam) .Marland Kitchen.. 1 tablet once daily with food  Other Orders: T-Vitamin D (25-Hydroxy) [phone]) Flu Vaccine 76yrs + [phone]) Admin 1st Vaccine (46962) Admin 1st Vaccine [facility]) [phone]) Pneumococcal Vaccine (32440) Admin of Any Addtl Vaccine (10272) Admin of Any Addtl Vaccine (State) [phone])  Patient Instructions: 1)  Return yearly, sooner in needed 2)  NEEDS TD NOW Prescriptions: SYNTHROID 100 MCG  TABS (LEVOTHYROXINE SODIUM) once daily  #90 x 3   Entered and Authorized by:   [name] MD   Signed by:   [name] MD on 03/28/2009   Method used:   Electronically to        [facility]* (retail)       [address]       [place], [place]  [id number]       Ph: [phone]       Fax: [phone]   RxID:   [id number]    Pneumovax Vaccine    Vaccine Type: Pneumovax     Site: right deltoid    Mfr: Merck    Dose: 0.5 ml    Route: IM    Given by: Zackery Barefoot CMA    Exp. Date: 04/19/2010    Lot #: [id number]  Influenza Vaccine    Vaccine Type: Fluvax 3+    Site: left deltoid    Mfr: Noartis    Dose: 0.5 ml    Route: IM    Given by: Zackery Barefoot CMA    Exp. Date: 06/21/2009    Lot #: [id number]  Flu Vaccine Consent Questions    Do you have a history  of severe allergic reactions to this vaccine? no    Any prior history of allergic reactions to egg and/or gelatin? no    Do you have a sensitivity to the preservative Thimersol? no    Do you have a past history of Guillan-Barre Syndrome? no    Do you currently have an acute febrile illness? no    Have you ever had a severe reaction to latex? no    Vaccine information given and explained to patient? yes    Are you currently pregnant? no  Appended Document: Primary svc/ f/u hypothyroidism  NEEDS TDAC NOW   Appended Document: Primary svc/ f/u hypothyroidism  LMOMTCB  Appended Document: Primary svc/ f/u hypothyroidism  Appt sched for 04/10/09 at 9:30 for tdap

## 2010-04-25 NOTE — Progress Notes (Signed)
Summary: returning call from nurse/ results  Phone Note Call from Patient   Caller: Patient Call For: wert Summary of Call: pt returned call from leslie re: results. [phone]. Initial call taken by: Tivis Ringer,  October 01, 2007 3:34 PM  Follow-up for Phone Call        atc x 2 busy Follow-up by: Vernie Murders,  October 01, 2007 4:08 PM

## 2010-04-25 NOTE — Letter (Signed)
Summary: Pre Visit Letter Revised  [facility]  [address] [place], [place] [address]   Phone: [phone]  Fax: [phone]        01/04/2010 MRN: [id number] BRYNNA DOBOS [address] [place], [place]  [address]             Procedure Date:  02-19-10   Welcome to the Gastroenterology Division at [facility].    You are scheduled to see a nurse for your pre-procedure visit on 02-05-10 at 4:30p.m. on the 3rd floor at [facility], [address]  We ask that you try to arrive at our office 15 minutes prior to your appointment time to allow for check-in.  Please take a minute to review the attached form.  If you answer "Yes" to one or more of the questions on the first page, we ask that you call the person listed at your earliest opportunity.  If you answer "No" to all of the questions, please complete the rest of the form and bring it to your appointment.    Your nurse visit will consist of discussing your medical and surgical history, your immediate family medical history, and your medications.   If you are unable to list all of your medications on the form, please bring the medication bottles to your appointment and we will list them.  We will need to be aware of both prescribed and over the counter drugs.  We will need to know exact dosage information as well.    Please be prepared to read and sign documents such as consent forms, a financial agreement, and acknowledgement forms.  If necessary, and with your consent, a friend or relative is welcome to sit-in on the nurse visit with you.  Please bring your insurance card so that we may make a copy of it.  If your insurance requires a referral to see a specialist, please bring your referral form from your primary care physician.  No co-pay is required for this nurse visit.     If you cannot keep your appointment, please call [phone] to cancel or reschedule prior to your appointment date.  This allows  [place] the opportunity to schedule an appointment for another patient in need of care.    Thank you for choosing [facility] for your medical needs.  We appreciate the opportunity to care for you.  Please visit [place] at our website  to learn more about our practice.  Sincerely, The Gastroenterology Division

## 2010-04-25 NOTE — Assessment & Plan Note (Signed)
Summary: yearly fu for hypothroidism   Visit Type:  Follow-up PCP:  Sheelah Ritacco  Chief Complaint:  Pt here for follow-up.  No complaints.  Meds reviewed.  Needs Synthroid RX.Marland Kitchen  History of Present Illness: 60 yowf never smoker with MS since Oct 96 all care through [facility] and Dr Elana Alm in [place].  returns now for follow-up of hypothyroidism denying any symptoms of cold intolerance heat intolerance changes in  energy or palpitations/ skin changes or unintended weight loss.  She continues to be able to push her mower around the yard except in the hottest of whether despite being somewhat limited by her MS.  She denies any difficulty swallowing or with cough or sob.    only new complaint is a sensation of the need to have a bowel movement immediately after meals it's been occurring the last 6 months while on high doses of Zieger per Dr. Christella Hartigan office for LPR.  Pt denies any significant sore throat, nasal congestion or excess secretions, fever, chills, sweats, unintended wt loss, pleuritic or exertional cp, orthopnea pnd or leg swelling.  Pt also denies any obvious fluctuation in symptoms with weather or environmental change or other alleviating or aggravating factors.           Prior Medications Reviewed Using: List Brought by Patient  Updated Prior Medication List: SYNTHROID 100 MCG  TABS (LEVOTHYROXINE SODIUM) once daily BONIVA 150 MG  TABS (IBANDRONATE SODIUM) 1 by mouth monthly ZEGERID 40-1100 MG  CAPS (OMEPRAZOLE-SODIUM BICARBONATE) 1 capsule by mouth two times a day GABAPENTIN 300 MG  CAPS (GABAPENTIN) Take 1 tablet by mouth three times a day RESTORIL 15 MG  CAPS (TEMAZEPAM) Take 1 tablet by mouth once a day BACLOFEN 10 MG  TABS (BACLOFEN) take 1 1/2 tabs by mouth four times a day PROZAC 40 MG  CAPS (FLUOXETINE HCL) Take 1 tablet by mouth once a day BETASERON 0.3 MG  SOLR (INTERFERON BETA-1B) injection every other day VESICARE 5 MG  TABS (SOLIFENACIN SUCCINATE) 1 by mouth two times a day   Current Allergies (reviewed today): ! PERCOCET ! * ZENAFLEX ! DILANTIN ! 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     Vital Signs:  Patient Profile:   60 Years Old Female Weight:      119.2 pounds O2 Sat:      98 % O2 treatment:    Room Air Temp:     98.0 degrees F oral Pulse rate:   71 / minute BP sitting:   112 / 70  (left arm) Cuff size:   regular  Vitals Entered By: Michel Bickers CMA (September 17, 2007 11:04 AM)                 Physical Exam  pleasant healthy-appearing ambulatory white female who actually looks about 10 years younger than stated age and  walks with a bit of an awkward gait but otherwise looks very healthy Afeb with normal vital signs HEENT: nl dentition, turbinates, and orophanx. Nl external ear canals without cough reflex Neck without JVD/Nodes/TM Lungs clear to A and P bilaterally without cough on insp or exp maneuvers RRR no s3 or murmur or increase in P2 Abd soft and benign with nl excursion in the supine position. No bruits or organomegaly Ext warm without calf tenderness, cyanosis clubbing or edema Skin warm and dry without lesions   on neurologic exam she had diffuse hyperreflexia with normal relaxation times      Problem # 1:  HYPOTHYROIDISM  (ICD-244.9) clinically she is euthyroid and TSH is nl today   I suspect the urge to have a bowel movement after meals is related to high doses of  Zegerid she takes and I  recommended she try off of it for a week.  If this resolves the problem, she should see Dr. Christella Hartigan for instructions regarding alternative therapies.  If it does not solve the problem, she needs to see Dr. 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 Norton Pastel  Problem # 3:  MULTIPLE SCLEROSIS (ICD-340) per Marcy Panning neurology  Problem # 4:  OSTEOPENIA (ICD-733.90) vitamin D level 6/29 follow-up per Dr. Kyra Manges on boniva  Medications Added to Medication List This Visit: 1)  Boniva 150 Mg Tabs (Ibandronate sodium) .Marland Kitchen.. 1 by mouth monthly 2)  Zegerid 40-1100 Mg Caps (Omeprazole-sodium bicarbonate) .Marland Kitchen.. 1 capsule by mouth two times a day 3)  Betaseron 0.3 Mg Solr (Interferon beta-1b) .... Injection every other day 4)  Vesicare 5 Mg Tabs (Solifenacin succinate) .Marland Kitchen.. 1 by mouth two times a day   Patient Instructions: 1)  GERD (gastroesophageal reflux disease) was discussed. It is a common cause of respiratory symptoms. It commonly presents in the absence of heartburn. GERD can be treated with medication, but also with lifestyle changes including avoidance of late meals, excessive alcohol, smoking cessation, and avoid fatty foods, chocolate, peppermint, colas, red wine, and acidic juices such as orange juice. 2)   NO MINT OR MENTHOL PRODUCTS 3)  USE SUGARLESS CANDY INSTEAD (jolley ranchers)  4)  try off zegerd for up to a week and if the post prandial symptoms aren't better we'll get you an appt for Dr Russella Dar   Prescriptions: SYNTHROID 100 MCG  TABS (LEVOTHYROXINE SODIUM) once daily  #34 x 11   Entered and Authorized by:   [name] MD   Signed by:   [name] MD on 09/17/2007   Method used:   Electronically sent to ...       [facility] [address] [phone]*       [address]       [place], [place]  [address]       Ph: [phone]       Fax: [phone]   RxID:   [id number]  ]

## 2010-06-11 ENCOUNTER — Other Ambulatory Visit: Payer: Self-pay | Admitting: Psychiatry

## 2010-06-11 DIAGNOSIS — G35 Multiple sclerosis: Secondary | ICD-10-CM

## 2010-06-13 ENCOUNTER — Ambulatory Visit
Admission: RE | Admit: 2010-06-13 | Discharge: 2010-06-13 | Disposition: A | Discharge status: Home / Self Care | Payer: 59 | Source: Ambulatory Visit | Attending: Psychiatry | Admitting: Psychiatry

## 2010-06-13 DIAGNOSIS — G35 Multiple sclerosis: Secondary | ICD-10-CM

## 2010-06-13 MED ORDER — GADOBENATE DIMEGLUMINE 529 MG/ML IV SOLN
11.0000 mL | Freq: Once | INTRAVENOUS | Status: AC | PRN
  Administered 2010-06-13: 11 mL via INTRAVENOUS

## 2010-06-25 ENCOUNTER — Other Ambulatory Visit: Payer: Self-pay | Admitting: Internal Medicine

## 2010-06-25 ENCOUNTER — Other Ambulatory Visit (INDEPENDENT_AMBULATORY_CARE_PROVIDER_SITE_OTHER): Payer: 59

## 2010-06-25 ENCOUNTER — Encounter: Payer: Self-pay | Admitting: Internal Medicine

## 2010-06-25 ENCOUNTER — Ambulatory Visit (INDEPENDENT_AMBULATORY_CARE_PROVIDER_SITE_OTHER): Payer: 59 | Admitting: Internal Medicine

## 2010-06-25 ENCOUNTER — Other Ambulatory Visit (INDEPENDENT_AMBULATORY_CARE_PROVIDER_SITE_OTHER): Payer: 59 | Admitting: Internal Medicine

## 2010-06-25 VITALS — BP 128/78 | HR 60 | Temp 98.4°F | Ht 64.0 in | Wt 129.6 lb

## 2010-06-25 DIAGNOSIS — M949 Disorder of cartilage, unspecified: Secondary | ICD-10-CM

## 2010-06-25 DIAGNOSIS — R0989 Other specified symptoms and signs involving the circulatory and respiratory systems: Secondary | ICD-10-CM

## 2010-06-25 DIAGNOSIS — M899 Disorder of bone, unspecified: Secondary | ICD-10-CM

## 2010-06-25 DIAGNOSIS — R0609 Other forms of dyspnea: Secondary | ICD-10-CM

## 2010-06-25 DIAGNOSIS — E785 Hyperlipidemia, unspecified: Secondary | ICD-10-CM

## 2010-06-25 LAB — CBC WITH DIFFERENTIAL/PLATELET
Basophils Absolute: 0.1 10*3/uL (ref 0.0–0.1)
Basophils Relative: 0.8 % (ref 0.0–3.0)
Eosinophils Absolute: 0.3 10*3/uL (ref 0.0–0.7)
Eosinophils Relative: 5.5 % — ABNORMAL HIGH (ref 0.0–5.0)
HCT: 35.3 % — ABNORMAL LOW (ref 36.0–46.0)
Hemoglobin: 12.2 g/dL (ref 12.0–15.0)
Lymphocytes Relative: 40.1 % (ref 12.0–46.0)
Lymphs Abs: 2.5 10*3/uL (ref 0.7–4.0)
MCHC: 34.5 g/dL (ref 30.0–36.0)
MCV: 90.1 fl (ref 78.0–100.0)
Monocytes Absolute: 0.7 10*3/uL (ref 0.1–1.0)
Monocytes Relative: 10.8 % (ref 3.0–12.0)
Neutro Abs: 2.7 10*3/uL (ref 1.4–7.7)
Neutrophils Relative %: 42.8 % — ABNORMAL LOW (ref 43.0–77.0)
Platelets: 299 10*3/uL (ref 150.0–400.0)
RBC: 3.92 Mil/uL (ref 3.87–5.11)
RDW: 13.9 % (ref 11.5–14.6)
WBC: 6.2 10*3/uL (ref 4.5–10.5)

## 2010-06-25 LAB — BRAIN NATRIURETIC PEPTIDE: Pro B Natriuretic peptide (BNP): 33 pg/mL (ref 0.0–100.0)

## 2010-06-25 NOTE — Progress Notes (Signed)
  Subjective:    Patient ID: Kayla [name], female    DOB: 01-01-1951, 60 y.o.   MRN: [id number]  HPI   24 yowf never smoker with MS since Oct 96 all care through [facility] and Dr Elana Alm in [place].   March 28, 2009 Followup. Pt c/o fatigue and hair loss x 6 months. no change weakness and mild ataxia related to ms.   September 07, 2009 --Presents for work in visit. Pt complains over last 3 months she wears out easily, gets more dyspneic easily. Dyspnea comes on intermittently sometimes at rest, and w/ activity. She can hear herself wheezing. If she overworks herself she can hear wheezing.   October 04, 2009 Followup with PFT's. Pt states that her breathing is no better since last seen in June for acute visit. She states she is still wheezing- ventolin helps and is using this up to tid. w/u c/w anemia  Mild dysphagia no more than usual. occ waking up wheezing at night despite zegerid at hs.    Try  use dulera 100 prn   06/25/2010  Ov using dulera 3x weekly with good ex tol and sleeping ok. No cough. Dysphagia no worse.  Pt denies any significant sore throat,  itching, sneezing,  nasal congestion or excess/ purulent secretions,  fever, chills, sweats, unintended wt loss, pleuritic or exertional cp, hempoptysis, orthopnea pnd or leg swelling.    Also denies any obvious fluctuation of symptoms with weather or environmental changes or other aggravating or alleviating factors.     Past Medical History:  MULTIPLE SCLEROSIS (ICD-340)........................Marland Kitchen Dr Trudie Buckler [facility], was at 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 asthma  - HFA 50 % after coaching October 04, 2009 >  50% 06/25/2010  - PFT's October 04, 2009 FEV1 2.15 (95%) with ratio 69 and DLCO 58>  corrects to 98, mild insp truncation  Health Maintenance.............................................Marland KitchenWert  - Pneumovax March 28, 2009  - Td due March 30, 2009                  Review of Systems     Objective:   Physical Exam      ambulatory white female who actually looks about 10 years younger than stated age and walks with a bit of an awkward gait but otherwise looks very healthy, wt 120 -> 118 > 127 March 28, 2009 >129 June 17 > 128 October 04, 2009 > 129 06/25/2010  HEENT: nl dentition, turbinates, and orophanx. Nl external ear canals without cough reflex  Neck without JVD/Nodes/TM  Lungs pseudowheeze resolves with purse lip maneuver, minimal insp stridor  RRR no s3 or murmur or increase in P2  Abd soft and benign with nl excursion in the supine position. No bruits or organomegaly  Ext warm without calf tenderness, cyanosis clubbing or edema  Skin warm and dry without lesions    Assessment & Plan:

## 2010-06-25 NOTE — Assessment & Plan Note (Signed)
The proper method of use, as well as anticipated side effects, of this metered-dose inhaler are discussed and demonstrated to the patient. Only 50% with coaching but clearly better on dulera and no longer need saba

## 2010-06-25 NOTE — Patient Instructions (Signed)
Call if breathing worsens despite using the dulera  Work on inhaler technique:  relax and gently blow all the way out then take a nice smooth deep breath back in, triggering the inhaler at same time you start breathing in.  Hold for up to 5 seconds if you can.  Rinse and gargle with water when done   If your mouth or throat starts to bother you,   I suggest you time the inhaler to your dental care and after using the inhaler(s) brush teeth and tongue with a baking soda containing toothpaste and when you rinse this out, gargle with it first to see if this helps your mouth and throat.

## 2010-06-26 LAB — LDL CHOLESTEROL, DIRECT: Direct LDL: 116.1 mg/dL

## 2010-06-26 LAB — BASIC METABOLIC PANEL WITH GFR
BUN: 11 mg/dL (ref 6–23)
CO2: 25 meq/L (ref 19–32)
Calcium: 9.1 mg/dL (ref 8.4–10.5)
Chloride: 100 meq/L (ref 96–112)
Creatinine, Ser: 0.7 mg/dL (ref 0.4–1.2)
GFR: 94.48 mL/min
Glucose, Bld: 78 mg/dL (ref 70–99)
Potassium: 4.9 meq/L (ref 3.5–5.1)
Sodium: 136 meq/L (ref 135–145)

## 2010-06-26 LAB — LIPID PANEL
Cholesterol: 205 mg/dL — ABNORMAL HIGH (ref 0–200)
HDL: 65 mg/dL (ref >39.00)
Total CHOL/HDL Ratio: 3
Triglycerides: 77 mg/dL (ref 0.0–149.0)
VLDL: 15.4 mg/dL (ref 0.0–40.0)

## 2010-06-26 LAB — TSH: TSH: 0.77 u[IU]/mL (ref 0.35–5.50)

## 2010-06-26 NOTE — Progress Notes (Signed)
Quick Note:  Spoke with pt and notified of results per Dr. [name]. Pt verbalized understanding and denied any questions.  ______ 

## 2010-06-26 NOTE — Progress Notes (Signed)
Quick Note:  Spoke with pt and notified of results per Dr. [name]. Pt verbalized understanding.   ______ 

## 2010-06-28 LAB — VITAMIN D 1,25 DIHYDROXY
Vitamin D 1, 25 (OH)2 Total: 98 pg/mL — ABNORMAL HIGH (ref 18–72)
Vitamin D2 1, 25 (OH)2: <8 pg/mL
Vitamin D3 1, 25 (OH)2: 98 pg/mL

## 2010-07-01 ENCOUNTER — Telehealth: Payer: Self-pay | Admitting: Internal Medicine

## 2010-07-01 NOTE — Telephone Encounter (Signed)
Spoke w/ pt and she states Dr. Sherene Sires was suppose to fax her ekg she had done on 06/24/10 to Dr. Tinnie Gens. Pt advised me of fax # [phone] to fax the ekg to. I advised pt I was going to go ahead and fax it now. Pt verbalized understanding and needed nothing further

## 2010-07-02 NOTE — Progress Notes (Signed)
Quick Note:  Spoke with pt and notified of results per Dr. [name]. Pt verbalized understanding and denied any questions.  ______ 

## 2010-08-09 NOTE — Discharge Summary (Signed)
NAMENIKIE, CID                         ACCOUNT NO.:  [id number]   MEDICAL RECORD NO.:  [id number]                   PATIENT TYPE:  INP   LOCATION:  3220                                 FACILITY:  [facility]   PHYSICIAN:  Katherine Roan, M.D.               DATE OF BIRTH:  21-Sep-1950   DATE OF ADMISSION:  10/25/2003  DATE OF DISCHARGE:  11/09/2003                                 DISCHARGE SUMMARY   ADMISSION DIAGNOSES:  1. Hypertension.  2. Multiple sclerosis.  3. Quiescent invasive adenocarcinoma of the endocervix.   POSTOPERATIVE DIAGNOSES:  1. Hypertension.  2. Multiple sclerosis.  3. Quiescent invasive adenocarcinoma of the endocervix.  4. Postop hospital acquired pneumonia.  5. Hypovolemia postoperatively.  6. Decreased renal output.  7. Exacerbation of multiple sclerosis.   BRIEF HISTORY:  Ms. Melchior is a 60 year old, gravida 2, para 2 female whose  comorbidities included hypothyroidism and multiple sclerosis presented to  the office about two months ago with a large exophytic mass which was  protruding through the cervix. In the office, an endometrial biopsy was  obtained and a mass was removed. The mass was consistent with an  adenocarcinoma of the lower segment; however, the endometrial biopsy  revealed atrophic endometrium. She was referred to Dr. Serita Kyle in  GYN/Oncology at [facility] at which time he recommended a cold  knife conization. He performed this which revealed invasive adenocarcinoma  of the endocervix.  A radical hysterectomy was planned for six weeks later.  In the meantime, she was followed by her physician at [facility] and felt to be  stable as far as the MS was concerned. She is also on oxybutynin for spastic  bladder.   LABORATORY DATA:  Because Ms. Watlington's course was quite complicated, she had  numerous hemoglobins, blood gases and white counts.  Her admission  hemoglobin was 12.5 her postoperative hemoglobin in the recovery  room was  10.8.  Her low hemoglobin was 8 with white counts between 7 and 10,000.  Blood gases were also done which were fairly normal. Chemistries revealed a  stable potassium level, sodium level fluctuated from high of 138 and  postoperatively in the ICU she had elevated cardiac enzymes with an elevated  CK and CK-MB.  The CK went to 1079 and the CK-MB was 10.7.  Chest x-rays  revealed pulmonary congestion and findings suggestive of pneumonia. Path  report reveal no residual adenocarcinoma. She did have focal adenomyosis.   HOSPITAL COURSE:  The patient was admitted to the hospital, underwent an  uneventful radical hysterectomy by Dr. Serita Kyle on a Wednesday  August 13.  Postoperatively she did quite well. She seemed unusually sedated  and had decreasing urinary output.  Two units of blood were given on the 4th  of August and looked much better at that time. About noontime on Thursday,  August 4, the patient was alert and talking to  friends.  August 5, early in  the morning, a Friday morning, the patient had decrease in sensorium,  difficulty moving and neurology was asked to see the patient and obtain  studies. Because of her instability, she was admitted to the ICU and acute  care medicine took over her management which involved oxygen, 2 more units  of blood and treatment for pulmonary congestion and hypovolemia.  As common  with radical hysterectomies, there was some degree of third spacing and  probably a lot of third spacing affect occurred and on about the fourth day,  she began to mobilize this fluid and developed some more pulmonary  congestion and IV Lasix was given. Subsequently her output has remained  fairly stable and she is gradually returning some of her muscular function  that she has lost immediately postoperatively. On the day of discharge, the  patient was alert and oriented, fully aware of her surgery, her  complications and her path report. Her lungs were  clear, her abdomen was  soft, incision was healing nicely. Examination of the vulva revealed some  ecchymosis which was considered normal after a radical hysterectomy. No  pelvic masses were noted. She is now being transferred to rehab for further  care and further treatment of her MS and her anemia.   CONDITION ON DISCHARGE:  Improved.                                               Katherine Roan, M.D.    SDM/MEDQ  D:  11/09/2003  T:  11/10/2003  Job:  [id number]   cc:   Melvyn Novas, M.D.  [address]  [address]  [place]  [place] [address]  Fax: [phone]   Charlaine Dalton. Sherene Sires, M.D. [facility]   De Blanch, M.D.

## 2010-08-09 NOTE — Consult Note (Signed)
NAME:  Kayla [name], Kayla [name] NO.:  [id number]   MEDICAL RECORD NO.:  [id number]          PATIENT TYPE:  OUT   LOCATION:  GYN                          FACILITY:  [facility]   PHYSICIAN:  De Blanch, M.D.DATE OF BIRTH:  1950-09-04   DATE OF CONSULTATION:  DATE OF DISCHARGE:                                   CONSULTATION   A 60 year old white female returns today for continued followup of cervical  cancer.  She underwent a radical hysterectomy and pelvic lymphadenectomy and  bilateral salpingo-oophorectomy on October 25, 2003.  Her postoperative course  is complicated by a severe flare of her multiple sclerosis.  She is now  recovered.  Patient reports that she is voiding well.  Has normal GI/GU  function.  Her functional status is now back to approximately 95% of what it  was preoperatively.   HISTORY OF PRESENT ILLNESS:  Patient underwent a radical hysterectomy and  pelvic lymphadenectomy on October 25, 2003.  Final pathology showed no  residual carcinoma in the cervix.  Preoperatively, she did have a visible  lesion, which was polypoid, making her a stage IBI lesion.   PAST MEDICAL HISTORY:  1.  Medical illnesses:  Multiple sclerosis.  2.  Surgical history:  Radical pelvic lymphadenectomy.   CURRENT MEDICATIONS:  1.  Betaseron.  2.  Neurontin.  3.  Prozac.  4.  Synthroid.  5.  Oxybutynin.   DRUG ALLERGIES:  1.  DILANTIN.  2.  ZANAFLEX.   FAMILY HISTORY:  Patient has an aunt with breast cancer.  Father with colon  cancer.   SOCIAL HISTORY:  Gravida 2.   REVIEW OF SYSTEMS:  Negative except as noted above.   PHYSICAL EXAMINATION:  VITAL SIGNS:  Weight 114 pounds.  GENERAL:  Patient is a slender white female in no acute distress.  HEENT:  Negative.  NECK:  Supple without thyromegaly.  There is no supraclavicular or inguinal  adenopathy.  ABDOMEN:  Soft and nontender.  No mass, organomegaly, ascites, or hernias  are noted.  PELVIC:  EG/BUS, vagina,  bladder, and urethra are normal.  The cuff is well  healed.  Well supported.  No lesions are noted.  Bimanual and rectovaginal  exam reveals no mass, induration, or nodularity.   IMPRESSION:  Stage IBI adenocarcinoma of the cervix, status post radical  hysterectomy.  Patient has a favorable prognostic factors and does not  require any additional treatment.  Pap smears are obtained today.  She will  return to see Dr. Elana Alm in three months and return to see [place] in six  months.     Dani   DC/MEDQ  D:  01/24/2004  T:  01/24/2004  Job:  [id number]   cc:   S. Kyra Manges, M.D.  [phone] [address]  [place]  [place] [address]  Fax: [phone]   Telford Nab, R.N.  [address]  [place], [place] [address]   Dr. Stoney Bang  [facility] Neurology Dept.   Charlaine Dalton. Sherene Sires, M.D. [facility]

## 2010-08-09 NOTE — Assessment & Plan Note (Signed)
[facility]                           PRIMARY CARE OFFICE NOTE   Kayla [name], Kayla [name]                      MRN:          [id number]  DATE:05/06/2006                            DOB:          06/22/1950    PRIMARY SERVICE/FOLLOWUP OFFICE VISIT   A 60 year old white female being followed at [facility] for MS on a complex  medical regimen. She also has hypothyroidism and is concerned about  cholesterol levels. She enjoys normal activity with no dyspnea,  exertional chest pain, orthopnea, PND or TIA or claudication symptoms.  Her MS symptoms have leveled off over the last several years and she  is interested more now in preventative medicine.   She denies any excessive heat or cold intolerance, palpitations or  change in bowel or bladder habits.   PHYSICAL EXAMINATION:  She is a pleasant, ambulatory white female in no  acute distress. Her weight is unchanged at 118 pounds. She has stable  vital signs. Pulse rate is approximately 74.  HEENT: Is unremarkable. Oropharynx is clear. There is no thyromegaly.  NECK: Supple without cervical adenopathy or tenderness.  LUNGS: Lung fields are clear bilaterally to auscultation and percussion.  HEART: Regular rate and rhythm without murmur, gallop or rub.  ABDOMEN: Soft, benign.  EXTREMITIES: Warm without calf tenderness, cyanosis, clubbing or edema.  NEUROLOGIC: She did have mild hyper-reflexia diffusely especially in the  upper extremities. Palms appeared slightly damp.   IMPRESSION:  1. Possible mild hyperthyroidism (versus the effects of MS). TSH will      be checked.  2. Concerns regarding hyperlipidemia. This is the only outstanding      primary care issue as her bone density followup is with Dr. Elana Alm      and she is not due for GI followup again until 2011 per most recent      notes. I therefore will check a lipid profile and see her back here      yearly to refill her Synthroid.     Charlaine Dalton. Sherene Sires,  MD, [facility]  Electronically Signed    MBW/MedQ  DD: 05/06/2006  DT: 05/06/2006  Job #: [id number]

## 2010-08-09 NOTE — Consult Note (Signed)
NAMEJAMAYIA, Kayla [name] NO.:  [id number]   MEDICAL RECORD NO.:  [id number]          PATIENT TYPE:  OUT   LOCATION:  GYN                          FACILITY:  [facility]   PHYSICIAN:  De Blanch, M.D.DATE OF BIRTH:  1950-03-27   DATE OF CONSULTATION:  DATE OF DISCHARGE:                                   CONSULTATION   CHIEF COMPLAINT:  Cervical cancer.   INTERVAL HISTORY:  Since her last visit, the patient has done well.  She  denies any GI or GU symptoms.  She has no pelvic pain, pressure, vaginal  bleeding or discharge.  She continues to work full time.   HISTORY OF PRESENT ILLNESS:  The patient underwent a radical hysterectomy  with pelvic lymphadenectomy in August, 2005.  Final pathology showed no  residual carcinoma in the cervix.  Initially, she had clinical stage IBI  disease.  She received no adjuvant therapy.   PAST MEDICAL HISTORY:  Multiple sclerosis.   PAST SURGICAL HISTORY:  Radical hysterectomy with pelvic lymphadenectomy.   CURRENT MEDICATIONS:  Betaseron, Neurontin, Prozac, Synthroid, oxybutynin.   DRUG ALLERGIES:  1.  DILANTIN.  2.  ZANAFLEX.   FAMILY HISTORY:  The patient has an aunt with breast cancer.  Father with  colon cancer.   SOCIAL HISTORY:  Gravida 2.   REVIEW OF SYSTEMS:  A 10-point review of systems is negative except as noted  above.   PHYSICAL EXAMINATION:  VITAL SIGNS:  Weight 118 pounds.  GENERAL:  The patient is a healthy white female in no acute distress.  HEENT:  Negative.  NECK:  Supple without thyromegaly.  There is no supraclavicular or inguinal  adenopathy.  ABDOMEN:  Soft and nontender.  No mass, organomegaly, ascites, or hernias  are noted.  PELVIC:  EG/BUS, vagina, bladder, and urethra are normal.  No lesions are  noted.  Cervix and uterus are surgically absent.  Adnexa are without masses.  Rectovaginal exam confirms.  EXTREMITIES:  Lower extremities without edema or varicosities.   IMPRESSION:  Stage  IBI squamous cell carcinoma of the cervix.  No evidence  for recurrent disease.  Pap smears are repeated.  Patient will have  mammograms obtained in the near future.  She will return to see Dr. Elana Alm  in four months and return to see me in six months.      De Blanch, M.D.  Electronically Signed     DC/MEDQ  D:  10/29/2004  T:  10/29/2004  Job:  [id number]   cc:   Telford Nab, R.N.  [address]  [place], [place] [address]   S. Kyra Manges, M.D.  [phone] [address]  [place]  [place] [address]  Fax: [phone]   Charlaine Dalton. Sherene Sires, M.D. [facility]  [address]  [place]  [place] [address]   Trudie Buckler  [facility] [place] [facility]  [place] [address]  Fax: [phone]

## 2010-08-09 NOTE — Op Note (Signed)
NAME:  Kayla [name], Kayla [name]                         ACCOUNT NO.:  [id number]   MEDICAL RECORD NO.:  [id number]                   PATIENT TYPE:  AMB   LOCATION:  DAY                                  FACILITY:  [facility]   PHYSICIAN:  De Blanch, M.D.         DATE OF BIRTH:  07-27-1950   DATE OF PROCEDURE:  08/29/2003  DATE OF DISCHARGE:                                 OPERATIVE REPORT   PREOPERATIVE DIAGNOSIS:  Adenocarcinoma of the cervix or endometrium.   POSTOPERATIVE DIAGNOSIS:  Probable adenocarcinoma of the cervix.   PROCEDURE:  1. Examination under anesthesia.  2. Hysteroscopy.  3. Cold-knife conization.   SURGEON:  De Blanch, M.D.   ASSISTANT:  Telford Nab, R.N.   ANESTHESIA:  General LMA.   ESTIMATED BLOOD LOSS:  10 cc.   SURGICAL FINDINGS:  Examination under anesthesia revealed the cervix which  was eroded, especially at between 5 and 6 o'clock.  Hysteroscopy revealed  polypoid mass in the endocervical canal and lower cervix.  The endometrium  appeared flat, and there were no lesions in the endometrial cavity.   DESCRIPTION OF PROCEDURE:  The patient was brought to the operating room and  after satisfactory attainment of general anesthesia, was placed in the  lithotomy position in candy cane stirrups.  The perineum, vagina and vulva  were prepped with Betadine.  Straight catheter was used to empty the  bladder.  The patient was draped and the patient examined with the above-  noted findings.   The cervix was grasped with a single-toothed tenaculum and the cervix  dilated to a 25-Pratt dilator.  Hysteroscopy was then performed, with the  above-noted findings.   Sutures were placed at 3 and 9 o'clock. to control the cervical branch of  the uterine artery.  Using a scalpel, a circumferential incision was made  around the cervix.  The cervix was then sutured with four 2-0 silk sutures  at 12, 3, 6 and 9 o'clock.  Using these sutures for traction,  the conization  was completed.  The suture at 12 o'clock was tied for orientation.  Endocervical curettage was performed.  The remainder of the uterus was then  dilated and attempt at sharp curettage was performed, revealing no  substantial amount of tissue.  Cautery was used to control bleeding from the  conization bed.  Sturmdorf sutures were then placed and tied, and Surgicel  placed in the cervical canal.   The patient was awakened from anesthesia and taken to the recovery room in  satisfactory condition.  Sponge, needle and instrument counts were correct  x2.                                               De Blanch, M.D.    DC/MEDQ  D:  08/29/2003  T:  08/29/2003  Job:  [id number]

## 2010-08-09 NOTE — Op Note (Signed)
**Note Kayla-Identified via Obfuscation** Kayla [name] NO.:  [id number]   MEDICAL RECORD NO.:  [id number]          PATIENT TYPE:  AMB   LOCATION:  [facility]                         FACILITY:  [facility]   PHYSICIAN:  Kayla [name], M.D.  DATE OF BIRTH:  10-29-1950   DATE OF PROCEDURE:  04/14/2006  DATE OF DISCHARGE:                               OPERATIVE REPORT   PREOPERATIVE DIAGNOSIS:  Stress urinary incontinence.   POSTOPERATIVE DIAGNOSIS:  Stress urinary incontinence.   PROCEDURE:  1. Cystoscopy.  2. Pubovaginal sling.   SURGEON:  Kayla [name], M.D.   ANESTHESIA:  General.   COMPLICATIONS:  None.   DRAINS:  Drains is a Foley catheter.   BRIEF HISTORY:  This 60 year old female has stress urinary incontinence.  She has a complicated history in that she is known to have MS and she  has also undergone previous radical hysterectomy and pelvic  lymphadenectomy for cervical cancer.  The patient underwent urodynamic  study, which showed no urgency incontinence and did show the ability to  generate an adequate detrusor contraction.  Her leak-point pressure was  diminished at 42.  Obviously it is a complicated situation in that  correction of her stress incontinent could lead to retention due to her  multiple sclerosis.  The patient understands that she may end up having  to do in-and-out catheterization, particularly if her bladder becomes  weaker over time.  Right now, however, she is quite bothered by the  stress incontinence and would like to have that repaired.  She  understands the risks and benefits of the procedure and gave full  informed consent.   PROCEDURE:  After successful induction of general anesthesia, the  patient is placed in the dorsal lithotomy position, prepped with  Betadine and draped in the usual sterile fashion.  Careful bimanual  examination revealed a very narrowed introitus from the previous  surgery.  A weighted vaginal speculum could not be placed and there was  very little room to maneuver within the vagina.  For that reason,  decision was made to switch our plan from me doing a transobturator  sling to a pubovaginal sling out of concern that it would be very  difficult if not impossible to pass the needles from the transobturator  position into this narrowed introitus.   In lieu of the weighted vaginal speculum a narrowed Deaver was placed  and used to afford some exposure to the anterior vaginal mucosa.  A  Foley catheter was then inserted and the bladder was drained.  It was  clear that there was some urethral hypermobility but no significant  cystocele and the patient did not require extensive repair.   The anterior vaginal mucosa was infiltrated with a local anesthetic  including epinephrine.  An incision was made directly over the mid  urethra and extended back towards the bladder neck.  The first  dissection went back to but not through the endopelvic fascia.  The  needles were passed from 2 small stab incisions just behind the pubic  bone down into the vaginal opening.  On the left-hand side, the needle  appeared to be in good position.  On the right-hand side, it was very  close to the bladder.  It did not really perforate the bladder but you  could see the blue color from the needle and it was felt that this  needed to be repositioned.   It was noted on cystoscopic examination that the bladder had a somewhat  distorted appearance due to previous surgery and that there certainly  was some scarring that was holding up portions of the bladder.  For that  reason, it was felt that it would be safer to actually open the  endopelvic fascia and pass the needle under finger guidance. For that  reason, the endopelvic fascia was opened and the needles were passed in  order to bring it a little further away from the bladder.  Inspection  showed that the lateral sulcus had not been injured and the bladder was  not injured.   The sling was then  positioned so that a right-angle clamp could be  placed between the urethra and the sling.  The protective sleeve was cut  away.  Cystoscopy revealed good support of the urethra, but with a full  bladder, Coude maneuver, there was prompt late straight flow of urine.   The area was irrigated and the incision was then closed with a running  suture of 2-0 Vicryl.  The patient tolerated the procedure was taken to  the recovery room in good condition.           ______________________________  Kayla [name], M.D.  Electronically Signed     RJE/MEDQ  D:  04/14/2006  T:  04/14/2006  Job:  [id number]   cc:   Kayla [name], M.D.  [address]  [place]  [place] [address]

## 2010-08-09 NOTE — Consult Note (Signed)
NAMEMarland Kitchen  Kayla [name], [name]                         ACCOUNT NO.:  [id number]   MEDICAL RECORD NO.:  [id number]                   PATIENT TYPE:  INP   LOCATION:  0165                                 FACILITY:  [facility]   PHYSICIAN:  Melvyn Novas, M.D.               DATE OF BIRTH:  04-09-1950   DATE OF CONSULTATION:  DATE OF DISCHARGE:                                   CONSULTATION   [name] with a birth date [birth year].  The patient was  admitted on October 25, 2003, for a hysterectomy for cervical cancer.  She has  a history of multiple sclerosis, which is followed by [facility], Dr. Ramiro Harvest.  The patient is on Betaseron Interferon  therapy every other day subcutaneous injections.  She has had occasional  intervention for MS relapses by IV steroids but seems to have not tolerated  those well and actually responds with bradycardia and hypotension to  methylprednisolone infusions.  After she underwent her hysterectomy on  Wednesday, she seems to have done very well.  Her hematocrit and hemoglobin  were slightly decreased yesterday at 9.3, and she was transfused, bring her  H&H up about 11.  She states that she felt really good after her infusion  and was able to get mobilized, took small sips of fluid, and felt no major  pain.  She had continued her medication at home, which is Baclofen, but also  had a Dilaudid pump added which, according to her R.N.'s, she has not used  excessively.  This morning the 60 year old female was afebrile but  complained about feeling very hot.   She had 93% oxygenation on 2 L of nasal cannula oxygen, which was given to  her after her complaint of shortness of breath.  She shows labored breathing  with some inspiratory wheezing.  She is difficult to arouse.  [name]  temperature was measured at 99.4 degrees Fahrenheit.  Her pulse is between  64 and 72, not elevated.  Her systolic blood pressure at 5 a.m. was  117/66.   Chem-7 as of this morning shows a hyponatremia of 126 mEq/L, the patient's  glucose was 133.  The patient appears not to be dehydrated.  Her BUN is 4,  her creatinine 0.7.  Her SGOT is 50, which is slightly elevated but could be  explained from her recent surgery, and she has low albumin and total protein  levels.  A CBC with differential was obtained showing a white blood cell  count of 6.9, and today's hematocrit and hemoglobin after transfusion were  35 and 12.  No chest x-ray or ABG were obtained yet and are currently  pending after orders.   NEUROLOGIC EVALUATION:  The patient is difficult to arouse.  She shows  conjugate gaze, but she seems to have trouble to keep her eyes open.  She  allowed me to  examine her, did try to cooperate with simple-step commands.  She shows normal horizontal gaze but no vertical gaze was observed.  She  seems not to be able to look up and downward.  She was showing no facial  asymmetry, bilateral opening is delayed, there is no ptosis at baseline, but  she can overcome her eye-opening apraxias only for a couple of seconds.  Her  pupils reacted equal to light.  Tongue and uvula were midline, nontremulous,  nondeviating.  The patient shows a normal gag.  She can speak to me and  shows no dysarthria or aphasia, but her sentences are simple, such as, I am  hot, or My back hurts.  Motor exam:  She shows bilaterally good grip,  wrist and elbow movements.  Increased tone is seen in her upper extremities  but especially in her lower extremities with a degree that I would call  spasticity.  It is hard for me to overcome her left knee extension.  When I  flex her knee passively, she is in pain.  She does not show neck rigidity.  Her spasticity is associated with increased deep tendon reflexes in the  lower extremities, clonus of two beats bilaterally, and an upgoing Babinski  response.  Sensory exam shows that she feels touch and pinprick, but she  also  states that her lower extremities hurt all over and that she feels  very, very hot.  Also to touch, the patient is not febrile.  The most pain  is in her paraspinal lumbar area, L4-5.  She was able to lift her upper  extremities against gravity but feels too weak and too much in pain to  coordinate any of her lower extremity movement.   ASSESSMENT:  This is not likely a multiple sclerosis change.  Her mental  status changes can be contributed by pain medication, although the pain  medication was no longer given this morning.  I am further worried about her  borderline low O2 on nasal cannula, her low sodium, and her very much  decreased renal output.  The patient has no history of responding to pain  medications with any mental status changes, only to steroids, which were not  given.  I do suggest that we restart her Baclofen for her lower extremity  pain and spasticity, and I wrote for a Lidoderm patch for the lower back.  I  have ordered a chest x-ray to rule out pulmonary embolus or aspiration, an  arterial blood gas, a cardiac panel with electrocardiogram, a CT of the  brain as an MRI is not obtainable to due to staples in her lower abdomen,  and I have kindly asked her primary care physician, Dr. Sherene Sires, to assist [place]  with the management especially with the metabolic and renal problems.  The  weekend service, Gustavus Messing. Orlin Hilding, M.D., of [facility], will follow with the patient.  My on-call pager is [phone], and  I left it on my orders and on my consultation in the chart.                                               Melvyn Novas, M.D.    CD/MEDQ  D:  10/27/2003  Kayla:  10/29/2003  Job:  [id number]

## 2010-08-09 NOTE — Op Note (Signed)
Kayla [name], Kayla [name] NO.:  [id number]   MEDICAL RECORD NO.:  [id number]          PATIENT TYPE:  AMB   LOCATION:  NESC                         FACILITY:  [facility]   PHYSICIAN:  Jamison Neighbor, M.D.  DATE OF BIRTH:  [birth year]   DATE OF PROCEDURE:  04/28/2006  DATE OF DISCHARGE:                               OPERATIVE REPORT   PREOPERATIVE DIAGNOSIS:  Urinary retention, status post pubovaginal  sling.   SECONDARY DIAGNOSIS:  Voiding dysfunction secondary to multiple  sclerosis.   POSTOPERATIVE DIAGNOSIS:  Voiding dysfunction secondary to multiple  sclerosis.   PROCEDURE:  Cystoscopy and partial takedown of the pubovaginal sling.   SURGEON:  Jamison Neighbor, M.D.   ANESTHESIA:  General.   COMPLICATIONS:  None.   DRAINS:  A cystocath.   HISTORY:  This 60 year old female underwent urodynamic testing in  preparation for treatment of stress urinary incontinence. She is  understood diminished bladder contractility secondary to MS, but did  have demonstrable stress incontinence.  The patient wanted to have a  pubovaginal sling placed with knowledge that she might have problems  with urinary retention that would  require in and out catheterization.  The patient had the sling placed successful and as was thought possible,  has not been able to urinate following the procedure.  She does not wish  to do self-catheterization and wants to have a partial sling take down.  The patient understands that she may have a return of her stress  incontinence, but she is very willing to consider that, we have agreed  to go ahead and place a suprapubic tube and try to simply revise the  sling if at all possible.  She gave full informed consent.   PROCEDURE:  After successful induction of general anesthesia, the  patient was placed in the dorsal position, prepped with Betadine and  draped in the usual sterile fashion.  Careful bimanual examination  revealed what appeared to be  a fairly neutrally placed urethra with no  excessive angulation.  The area was infiltrated with local anesthetic.  While that was setting up, the cystoscope was inserted.  The bladder was  inspected.  It did not appear to be over corrected.  It appeared to be  in a somewhat neutral position.  The bladder itself was inspected.  There was no sign of sling erosion or other problems following sling  placement.  The anterior vaginal mucosa was then opened by cutting the  old sutures and incising through that area.  The sling was identified  and a right-angle clamp was placed behind it.  It could not be pulled  down so for that reason it was split in the midline, however, formal  urethrolysis or freeing up the urethra up the sides was not done.  It  was thought that this would help to hold the urethra in reasonable  place, but by cutting the suburethral segment, it will allow some  mobility which should free things up..  Cystoscope was reinserted the  bladder was filled and a __________ maneuver did show prompt flow of  urine.  The urethra still seemed to have good coaptation and it was  thought that perhaps optimal tensioning had been obtained under direct  vision, the Bard cystocath was advanced into the bladder.  This was done  through small stab incision.  The string was pulled in the area coiled  normally.  The string was tied off and then covered.  The patient had  the  extender attached to this with an on-off clamped this will allow the  patient to check and see how well she is emptying.  The patient  tolerated the procedure well and was taken to recovery in good  condition.  She will be sent home with Macrodantin as well as pain  medicine and return to see me in a week or two for follow-up.           ______________________________  Jamison Neighbor, M.D.  Electronically Signed     RJE/MEDQ  D:  04/28/2006  T:  04/28/2006  Job:  [id number]

## 2010-08-09 NOTE — Discharge Summary (Signed)
NAME:  Kayla [name], Kayla [name]                         ACCOUNT NO.:  [id number]   MEDICAL RECORD NO.:  [id number]                   PATIENT TYPE:  IPS   LOCATION:  4004                                 FACILITY:  [facility]   PHYSICIAN:  Ellwood Dense, M.D.                DATE OF BIRTH:  [birth year]   DATE OF ADMISSION:  11/09/2003  DATE OF DISCHARGE:  11/17/2003                                 DISCHARGE SUMMARY   DISCHARGE DIAGNOSES:  1. Multiple sclerosis with exacerbation.  2. 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  3. Pain management.  4. Suprapubic catheter after above procedure.  5. Hyponatremia, resolved.  6. Depression.  7. Hypothyroidism.  8. Pneumonia, resolved.   HISTORY OF PRESENT ILLNESS:  This is a 60 year old white female with history  of multiple sclerosis followed by Dr. Ramiro Harvest of [facility],  admitted August 3 with findings of large exophytic polypoid mass extruding  from the cervix.  Pathology report positive for adenocarcinoma of the  endocervix.  Underwent radical abdominal hysterectomy, bilateral salpingo-  oophorectomy, and placement of suprapubic catheter August 3 by Dr. Stanford Breed.  Postoperative anemia and transfused.  Neurology consult, Dr.  Vickey Huger, for altered mental status felt to be narcotic  induced which  improved with discontinuation of Dilaudid.  MRI was not completed secondary  to staples in the abdomen.  Cranial CT scan on August 5 was negative.   Critical care management, Dr. Sherene Sires.  She was in intensive care unit October 27, 2003, followed by Dr. Stanford Breed.  No plan for chemotherapy for  adenocarcinoma at this time after recent procedure.  Remained on intravenous  vancomycin, Zosyn for pneumonia.  No indication of duration.  Admitted for  comprehensive rehab program.   PAST MEDICAL HISTORY:  See Discharge Diagnoses.   ALLERGIES:  DILANTIN, ZANAFLEX, AND  TYLOX.   MEDICATIONS PRIOR TO ADMISSION:  Neurontin, Synthroid, Ditropan, Baclofen,  and Betaseron.   SOCIAL HISTORY:  She lives with husband, two daughters, one-level home,  three steps to entry.  Husband works day shift.  Mother can assist on  discharge.   HOSPITAL COURSE:  Patient with progressive gains while on rehab services.  Therapies initiated on a b.i.d. basis.  The following issues were followed  during patient's rehabilitation course.  Pertaining to Ms. Simic multiple  sclerosis with exacerbation, she remained on hold with Betaseron until  followup in neurology services, Dr. Ramiro Harvest, [facility].  She  received ongoing followup per Dr. Stanford Breed for adenocarcinoma of the  endocervix.  She underwent total abdominal hysterectomy with bilateral  salpingo-oophorectomy August 3.  Surgical site healing nicely.  She did have  some low-grade fevers, although she had remained on intravenous vancomycin  and Zosyn for suspected pneumonia with latest chest x-ray on rehab services  showing to be improved except for some patchy bilateral  air space  infiltrates.  Her PICC line was clean and dry.  A CT of the abdomen and  pelvis did show a small amount of  perihepatic ascites fluid visible in  Morrison's pouch, some scattered low-density liver lesions.  There was an  apparent hematoma involving the rectus sheath, right greater than left.  OB-  GYN oncology was consulted.  She did undergo a CT-guided aspiration of  hematoma which yielded only 3 ml of fluid.   With these fevers ongoing, infectious disease was consulted.  It was felt to  actually be antibiotic induced cause of fevers.  All antibiotics were  discontinued.  Fever subsided.  During workup for fever, she received a  venous Doppler of the lower extremities showing no signs of deep vein  thrombosis.  Latest blood cultures on August 22 were negative.  Urine  culture negative.   Overall for functional ability, she  was ambulating supervision, endurance  greatly improved.  Full family teaching was completed, and she was to be  discharged to home.  She remained on subcutaneous Lovenox until her  discharge.   DISCHARGE MEDICATIONS:  At time of dictation included:  1. Ditropan 2.5 mg twice daily and 5 mg at bedtime.  2. Protonix 40 mg daily.  3. Baclofen 10 mg daily and 15 mg at nighttime.  4. Prozac 40 mg at bedtime.  5. Neurontin 300 mg 3 times daily.  6. Synthroid 75 mcg at bedtime.  7. Ferrous sulfate 325 mg 3 times daily.  8. Folic acid 1 mg daily.  9. Multivitamins daily.   ACTIVITY:  As tolerated.   DIET:  Regular.   FOLLOW UP:  She would follow up with Dr. Ramiro Harvest for ongoing management of  her multiple sclerosis and resumption of her Betaseron.  She would follow up  with Dr. Stanford Breed for ongoing coverage of recent adenocarcinoma of the  endocervix.      Mariam Dollar, P.A.                     Ellwood Dense, M.D.    DA/MEDQ  D:  11/17/2003  T:  11/17/2003  Job:  [id number]   cc:   De Blanch, M.D.   Katherine Roan, M.D.  [address]  [place]  [place] [address]  Fax: [phone]   Dr. Ramiro Harvest, [facility]  Neurology Department

## 2010-08-09 NOTE — Op Note (Signed)
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   PROCEDURE:  Bilateral medialization laryngoplasty.   SURGEON:  Dr. Lucky Cowboy.   ASSISTANT SURGEON:  Dr. Christia Reading.   ANESTHESIA:  General.   ESTIMATED BLOOD LOSS:  20 mL.   COMPLICATIONS:  None.   INDICATIONS:  This patient is a 59 year old female with multiple  sclerosis.  She has had markedly deteriorated voice which was switched  and not improved with antireflux therapy.  She did have a saccular cyst  removed.  This did improve her voice somewhat, however, there continues  to be a weak voice.  She was noted to have right vocal cord paresis with  vocal cord bowing on the left consistent with age.  For these reasons,  medialization of both of the vocal cords is performed.   FINDINGS:  The patient was noted to have excellent medialization after  Silastic implant was performed, bilaterally.   PROCEDURE:  The patient was placed on the table in a beach chair-like  position.  She was placed under light sedation with oxygen.  The neck  was prepped with Betadine and draped in the usual sterile fashion.  Injection and anesthesia was performed with 1% lidocaine with a  1:100,000 of epinephrine.  A transverse incision was made approximately  2/3 of the way down the thyroid cartilage.  This was total extension of  approximately 4 cm.  A 15 blade was used and subcutaneous tissues and  platysma muscle along with subplatysmal flap superiorly to the hyoid  and  inferiorly to the cricoid were performed using Bovie cautery.  The right  side was performed first.  Visualization had already been set up with  the nasopharyngoscope down the right nasal cavity.  Vocal cord movement  was monitored at all time and the patient was lightened with regard to  sedation when phonation was required.  The lower limit of the cartilage  incision was then performed using an oscillating straight blade.  A  rectangle of cartilage was removed.  A block of Silastic was then carved  to provide the right level of medialization.  This was then secured to  the perichondrium in a simple interrupted fashion using 3-0 nylon.  The  left medialization was performed in identical fashion with a smaller  amount of Silastic being required.  It was secured to the perichondrium  in an identical fashion with nylon.  The wound was copiously irrigated  with normal saline.  Phonation was checked at the end of the procedure  to ensure a good voice and appropriate medialization.  Platysma muscle  was reapproximated in  a simple interrupted buried fashion using 3-0 Vicryl.  The skin was  closed a running stitch using 4-0 Prolene.  Bacitracin ointment was  applied.  The patient was then awakened from anesthesia and taken to the  post anesthesia care unit in stable condition.  There were no  complications.      Lucky Cowboy, MD  Electronically Signed     SJ/MEDQ  D:  07/03/2006  T:  07/03/2006  Job:  [id number]

## 2010-08-09 NOTE — Consult Note (Signed)
Kayla [name], CHOUDHRY NO.:  [id number]   MEDICAL RECORD NO.:  [id number]          PATIENT TYPE:  OUT   LOCATION:  GYN                          FACILITY:  [facility]   PHYSICIAN:  De Blanch, M.D.DATE OF BIRTH:  February 22, 1951   DATE OF CONSULTATION:  DATE OF DISCHARGE:                                   CONSULTATION   CHIEF COMPLAINT:  Cervical cancer.   INTERVAL HISTORY:  Since her last visit the patient has done well.  Her only  complaint is that of significant incontinence during the day.  She says she  does not leak at all overnight but during the day has to wear a pad  continually.  Apparently, Dr. Elana Alm has given her a number of different  oral medications, none of which resulted in any improvement in her symptoms.  Currently, she is on Reunion.  She has not had any urodynamic evaluation.  Otherwise, her health has been good despite the fact she has multiple  sclerosis.   HISTORY OF PRESENT ILLNESS:  The patient underwent a radical hysterectomy  and pelvic lymphadenectomy August 2005.  Final pathology showed no residual  disease in the cervix and negative lymph nodes.   PAST MEDICAL HISTORY:  Multiple sclerosis (stable at the present time).   PAST SURGICAL HISTORY:  Radical hysterectomy and pelvic lymphadenectomy.   CURRENT MEDICATIONS:  Betaseron, Neurontin, Prozac, Synthroid and Nexium.   DRUG ALLERGIES:  PERCOCET, DILANTIN, ZANAFLEX.   FAMILY HISTORY:  Aunt with breast cancer.  Father with colon cancer.   SOCIAL HISTORY:  The patient is married.  She does not smoke.   OBSTETRICAL HISTORY:  Gravida 2.   REVIEW OF SYSTEMS:  A 10-point comprehensive review of systems is negative  except as noted above.   PHYSICAL EXAMINATION:  VITAL SIGNS:  Weight 115 pounds.  Blood pressure  112/68.  GENERAL:  The patient is a healthy white female in no acute distress.  HEENT:  Negative.  NECK:  Supple without thyromegaly.  There is no supraclavicular or  inguinal  adenopathy.  ABDOMEN:  Soft and nontender.  No mass, organomegaly, ascites or hernias  noted.  PELVIC:  EG, BUS, vagina, bladder and urethra are normal.  The cervix and  uterus are surgically absent.  Vaginal cuff is well supported.  She does  have a mild cystocele.  No lesions are noted.  Bimanual and rectovaginal  exam reveal no masses, induration or nodularity.   IMPRESSION:  Stage 1B squamous cell carcinoma of the cervix.  No evidence of  recurrent disease.  Pap smears were repeated.   With regard to her urinary incontinence, I believe it would be reasonable to  evaluate her further with urodynamic studies which we will arrange with a  urologist here in [place].  She will return to see Dr. Elana Alm in six  months.  Return to see [place] in one year.      De Blanch, M.D.  Electronically Signed     DC/MEDQ  D:  11/14/2005  T:  11/15/2005  Job:  [id number]

## 2010-08-09 NOTE — H&P (Signed)
NAME:  Kayla [name], Kayla [name]                         ACCOUNT NO.:  [id number]   MEDICAL RECORD NO.:  [id number]                   PATIENT TYPE:  INP   LOCATION:  NA                                   FACILITY:  [facility]   PHYSICIAN:  Katherine Roan, M.D.               DATE OF BIRTH:  Feb 22, 1951   DATE OF ADMISSION:  DATE OF DISCHARGE:                                HISTORY & PHYSICAL   CHIEF COMPLAINT:  Adenocarcinoma of the cervix and lower uterine segment.   HISTORY OF PRESENT ILLNESS:  Kayla [name] is a 60 year old female who was  first seen in the office in May 2005 with a large exophytic polypoid mass  extruding from the cervix.  This was excised and the pathology report  revealed an adenocarcinoma consistent with lower uterine segment.  She was  sent to Dr. Serita Kyle for evaluation who felt that conization was  in order and indeed the conization revealed an adenocarcinoma of the  endocervix.  She is now scheduled for radical hysterectomy.  I should note  that her Pap smear revealed atypical glandular cells suspicious for  adenocarcinoma in situ.  Prior to that her Pap smear have been normal.  Her  last Pap smear to that was in April 2004.   Her co-morbidity includes hypothyroidism and multiple sclerosis.   CURRENT MEDICATIONS:  Neurontin, Synthroid, oxybutynin, baclofen, and an  injection for MS.   ALLERGIES:  ZANAFLEX.   PAST MEDICAL HISTORY:  She is a gravida 2, para 2 female with her last  normal period in April 2003.  Other than two spontaneous deliveries, she has  no surgical procedure than a cold knife conization.   REVIEW OF SYSTEMS:  She wears glasses but noticed no decrease in visual or  auditory acuity.  HEART:  No hypertension, no chest pain, no history of  mitral valve prolapse.  LUNGS:  No chronic cough, no asthma.  GU:  No stress  urinary incontinence.  No frequency of urination.  No history of UTI.  GI:  No bowel habit change, no melena, no weight loss.   MUSCLES, BONES AND  JOINTS:  No fractures.  She has been followed at [facility] for her MS and has been cleared for this surgical  procedure.   FAMILY HISTORY:  Her mother and father are both living and well.  She has  two brothers and three sisters who are in good health.  Her father had colon  cancer.   PHYSICAL EXAMINATION:  GENERAL:  Healthy, well-developed female who is  alert, oriented to time place and recent event.  She appears to be her  stated age of 60.  VITAL SIGNS:  Blood pressure 120/80.  Weight 117.  Pulse 80.  HEENT:  Eyes, ears, nose, and throat are unremarkable.  Oropharynx is  noninjected.  NECK:  Supple.  Carotid pulses are equal  without bruits.  Thyroid is not  enlarged.  I feel no enlarged nodes in the supraclavicular fossa or in the  neck  BREASTS:  No masses, tenderness.  Axilla free from adenopathy.  HEART:  Normal sinus rhythm.  No murmurs, no heaves, thrills, rubs, gallops.  LUNGS:  Clear.  ABDOMEN:  Soft.  Liver, spleen and kidneys are not palpated.  Bowel sounds  are normal.  EXTREMITIES:  Good range of motion, equal pulses and reflexes.  PELVIC:  Normal cervix.  There is post cone changes.  No pelvic masses are  noted.  Uterus appears to be small.  Rectovaginal confirms.   IMPRESSION:  Adenocarcinoma of the endocervix with invasion.   PLAN:  Radical hysterectomy by Dr. Stanford Breed.  The patient has been  fully apprised of the risk including hemorrhage, infection, damage to  bladder, bowel, and postoperative bladder atony.                                               Katherine Roan, M.D.    SDM/MEDQ  D:  10/23/2003  T:  10/23/2003  Job:  [id number]

## 2010-08-09 NOTE — Consult Note (Signed)
NAME:  Kayla [name], Kayla [name]                         ACCOUNT NO.:  [id number]   MEDICAL RECORD NO.:  [id number]                   PATIENT TYPE:  OUT   LOCATION:  GYN                                  FACILITY:  [facility]   PHYSICIAN:  De Blanch, M.D.         DATE OF BIRTH:  March 26, 1950   DATE OF CONSULTATION:  DATE OF DISCHARGE:                                   CONSULTATION   A 60 year old white female returns to discuss pathology reports.  She  underwent a cold knife conization on June 7th with a finding of  adenocarcinoma of the cervix.  Previously, Dr. Elana Alm had removed a 3 cm  polyp which contained adenocarcinoma as well.  The patient has had an  uncomplicated postoperative course.   IMPRESSION:  Stage IBI (gross lesion, less than 4 cm in diameter)  adenocarcinoma of the cervix.   I had a lengthy discussion with the patient and her husband regarding  management options, which would either include surgery or radiation therapy.  After considering the pro's and con's, risks and benefits, the patient and  her husband would like to proceed with radical hysterectomy and pelvic  lymphadenectomy.  We will allow the cervix to heal further and would  anticipate surgery on October 25, 2003.   The risks of surgery, including hemorrhage, infection, injury to adjacent  viscera, and thrombo-embolic complications are outlined.  I specifically  discussed with them the common problem of bladder dysfunction following a  radical hysterectomy and emphasized that this may be further complicated by  her multiple sclerosis.  After answering other questions, we have agreed to  proceed with surgery, as noted above.  She will undergo a preoperative  workup in due course.                                               De Blanch, M.D.    DC/MEDQ  D:  09/12/2003  T:  09/13/2003  Job:  [id number]   cc:   Charlaine Dalton. Sherene Sires, M.D. [facility]   S. Kyra Manges, M.D.  [phone] [address]  [place]  [place] [address]  Fax: [phone]   Trudie Buckler  [facility] [place]  [place] [address]  Fax: [phone]   Telford Nab, R.N.  [phone] [address]  [place], [place] [address]

## 2010-08-09 NOTE — Op Note (Signed)
NAMESHAKARI, Kayla [name]                         ACCOUNT NO.:  [id number]   MEDICAL RECORD NO.:  [id number]                   PATIENT TYPE:  INP   LOCATION:  0440                                 [facility]ACILITY:  [facility]   PHYSICIAN:  De Blanch, M.D.         DATE OF BIRTH:  06-02-1950   DATE OF PROCEDURE:  10/25/2003  DATE OF DISCHARGE:                                 OPERATIVE REPORT   PREOPERATIVE DIAGNOSIS:  Stage IBI squamous cell carcinoma of the cervix.   POSTOPERATIVE DIAGNOSIS:  Stage IBI squamous cell carcinoma of the cervix.   PROCEDURE:  Radical abdominal hysterectomy, bilateral salpingo-oophorectomy,  pelvic lymphadenectomy, placement of suprapubic catheter.   SURGEON:  De Blanch, M.D.   ASSISTANTS:  1. Katherine Roan, M.D.  2. Telford Nab, R.N.   ANESTHESIA:  General with orotracheal tube.   ESTIMATED BLOOD LOSS:  300 cc.   SURGICAL FINDINGS:  At the time of exploratory laparotomy, the upper  abdominal organs were normal.  There were no enlarged pelvic or periaortic  lymph nodes.  The uterus, tubes, and ovaries appeared normal.  There was no  evidence of metastatic disease.   PROCEDURE:  The patient was brought to the operating room and after  satisfactory attainment of general anesthesia, was placed in a modified  lithotomy position in Allen's stirrups.  The anterior abdominal wall,  perineum, and vagina were prepped with Betadine.  The Foley catheter was  inserted, and the patient was draped.  The abdomen was entered through a  Pfannenstiel incision.  The upper abdomen and pelvis were explored with the  above-noted findings.  A Bookwalter retractor was positioned, and the small  bowel packed out of the pelvis.  The uterus was grasped with long Kelly  clamps, and the round ligaments were divided.  The retroperitoneum was  opened.  The perirectal and perivesical spaces were opened to identify the  pelvic side wall vessels and ureter.  The  ovarian vessels were skeletonized,  clamped, cut, suture-ligated, and free tied using 2-0 Vicryl.  The uterine  artery was then isolated at its origin, doubly clipped with hemoclips,  divided, and the distal portion identified with a 2-0 silk tie.  The  cardinal ligaments were then divided on the pelvic side wall using the  reticulating Universal linear stapler.  A similar procedure was performed on  both sides of the pelvis.  The peritoneum was then freed from its  attachments to the ureters bilaterally, and the ureters held laterally.  The  peritoneum overlying the posterior cul de sac was then incised, and the  rectovaginal space opened.  The uterosacral ligaments were skeletonized and  divided with Universal linear staplers.  The bladder flap was advanced  through sharp and blunt dissection to mobilize it away from the cervix and  upper vagina.  The ureters were then dissected out of their tunnels using  Anderson clamps to open the anterior vesicouterine ligament.  Throughout  the  dissection, the ureter was mobilized from its other attachments until we  identified the ureter entering the bladder.  The ureter was then mobilized  laterally, and the posterior vesicouterine ligament was clamped, cut, and  suture-ligated using a 2-0 Vicryl suture.  The paravaginal tissue and  remainder of the uterosacral ligament was then clamped, cut, and suture  ligated.  The vaginal angles were then cross clamped and divided, and the  vagina transected.  The uterus, cervix, tubes, and ovaries and upper vagina  were then inspected, and an adequate vaginal cuff was then identified.  The  vaginal angle was then closed with interrupted figure-of-eight sutures of 0  Vicryl and the central portion of the vagina closed with interrupted figure-  of-eight sutures of 0 Vicryl.  The peritoneum of the bladder flap, vaginal  cuff, posterior rectovaginal septum, and posterior peritoneum overlying the  rectum was then  reapproximated with interrupted sutures in a Hall band type  of culdoplasty.   Attention was turned to pelvic lymphadenectomy.  The lymph nodes overlying  and surrounding the external iliac artery and vein, internal iliac artery  and obturator fossa were dissected free from all of their attachments.  Hemostasis was achieved with cautery and hemoclips.  Throughout the  dissection, care was taken to avoid vascular injury and injury to the  obturator nerve.  Packs were placed in the pelvis, and the retropubic space  was opened.  An incision was made in the dome of the bladder, and a  suprapubic catheter was brought through a stab incision in the midline in  the suprapubic region.  The catheter was placed in the cystotomy, and the  cystotomy closed in two layers, the first being a running through-and-  through suture of 2-0 Vicryl.  A second embrocating layer was then created  using 2-0 Vicryl as well.  Pelvis was irrigated with warm saline.  Hemostasis was achieved with cautery.  An additional suture of 2-0 Vicryl in  the perivesical space on the right.  The packs and retractors were removed.  The anterior abdominal wall was then closed in layers, first being a running  suture of 2-0 Vicryl on the peritoneum subcutaneous tissue, and subfascial  tissue was irrigated.  Hemostasis was achieved with cautery.  The fascia was  closed with a running suture of 0 PDS.  Subcutaneous tissue was irrigated  again.  The hemostasis was achieved with cautery.  The skin was closed with  skin staples.  The suprapubic catheter was secured to the skin with a 2-0  silk suture.  A dressing was applied.  The patient was awakened from  anesthesia and taken to the recovery room in satisfactory condition.  Sponge, needle, and instrument counts were correct x2.                                               De Blanch, M.D.    DC/MEDQ  D:  10/25/2003  T:  10/25/2003  Job:  [id number]   cc:   S. Kyra Manges, M.D. [phone] [address]  [place]  [place] [address]  Fax: [phone]   Telford Nab, R.N.  [address]  [place], [place] [address]

## 2010-08-09 NOTE — Op Note (Signed)
NAMEALONA, Kayla [name] NO.:  [id number]   MEDICAL RECORD NO.:  [id number]          PATIENT TYPE:  INP   LOCATION:  2899                         FACILITY:  [facility]   PHYSICIAN:  Lucky Cowboy, MD         DATE OF BIRTH:  09/13/1950   DATE OF PROCEDURE:  09/17/2005  DATE OF DISCHARGE:                                 OPERATIVE REPORT   PREOPERATIVE DIAGNOSIS:  Right vocal cord paralysis, bilateral vocal cord  paresis.   POSTOPERATIVE DIAGNOSIS:  Right vocal cord paralysis, left vocal cord  paresis with right vocal cord saccular cyst.   PROCEDURE:  Flexible nasopharyngoscopy, micro-direct laryngoscopy with micro-  flap removal of right-sided saccular cyst.   SURGEON:  Dr. Lucky Cowboy.   ASSISTANT SURGEON:  Dr. Christia Reading.   ANESTHESIA:  General endotracheal anesthesia.   ESTIMATED BLOOD LOSS:  None.   COMPLICATIONS:  None.   INDICATIONS:  The patient is a 60 year old female, who became hoarse in  2000.  She was noted to have  bilateral vocal cord paresis by EMG.  She  is  most concerned about the poor quality of her voice.  Fiberoptic examination  revealed right vocal cord paresis with left vocal cord bowing causing  diplophonia.   FINDINGS:  The patient was noted to have a large cystic degenerative type  change within the false vocal cord on the right side.  This pushed over the  vocal cord and caused it to be convex with a contralateral bowing.  There  was extension with fullness in the subglottis.  There was a cystic type  change in the right mid and posterior vocal cord in the area where saccular  cyst would develop.  The patient was originally planned for right  medialization laryngoplasty however, after the flexible transnasal  fiberoptic laryngoscopy was performed after decongestant and anesthetization  of the right nasal cavity, these findings were noted.  Intraoperative  consultation with the patient's husband was performed.  He authorized signed  consent to proceed with micro-direct laryngoscopy with removal of the  saccular cyst.  The table was rotated counterclockwise 90 degrees.  The head  and body were draped.  An upper tooth guard was placed.  A Yankauer  laryngoscope was then placed intraorally with exposure of the glottis.  The  laryngoscope was then suspended on the Lewy suspension arm.  The 0 degree  Storz Hopkins endoscope was used to visualize the endolarynx.  Photographs  were taken.  1:1000 of epinephrine was placed topically on the right vocal  cord for vasoconstriction.  After allowing time for this effect, the  microscope was then used to visualize the right vocal cord.  The cystic  portion of the false cord in the area of the vallecula was removed using  both scissors as well as the laryngeal knife.  This excised the polypoid  mucosa.  A small amount of mucous with a little bit of purulent color was  then evacuated.  The photographs were then taken using the 0 degrees Storz  Hopkins endoscope.  The  laryngoscope was removed noting  no damage to the teeth or soft tissues.  The  table was rotated clockwise 90 degrees to its original position.  The  patient was then awakened from anesthesia and taken to the post anesthesia  care unit  in stable condition.  There were no complications.      Lucky Cowboy, MD  Electronically Signed     SJ/MEDQ  D:  09/17/2005  T:  09/17/2005  Job:  [id number]   cc:   [facility]   Charlaine Dalton. Sherene Sires, M.D. [facility]  [address]  [place]  [place] [address]   Venita Lick. Russella Dar, M.D. [facility]  [address]  [place]  [place] [address]

## 2010-08-09 NOTE — Consult Note (Signed)
NAME:  Kayla [name], Kayla [name]                         ACCOUNT NO.:  [id number]   MEDICAL RECORD NO.:  [id number]                   PATIENT TYPE:  OUT   LOCATION:  GYN                                  FACILITY[facility]   PHYSICIAN:  De Blanch, M.D.         DATE OF BIRTH:  1950/05/16   DATE OF CONSULTATION:  DATE OF DISCHARGE:                                   CONSULTATION   A 60 year old white female seen in consultation at the request of Dr. Kyra Manges regarding newly diagnosed adenocarcinoma and a polyp protruding  through the endocervix. The patient presented with some abnormal vaginal  bleeding and was evaluated by Dr. Elana Alm who found a 3 cm polyp which was  excised. Pathology revealed an endometrioid carcinoma, grade 1, occurring in  the lower uterine segment, or endocervical canal. Endometrial biopsy showed  scan benign columnar mucosa.   The patient is menopausal and does not take any hormone replacement therapy.   PAST MEDICAL HISTORY:  Medical illnesses notable for multiple sclerosis  since 1996. The patient does have weakness in the arms and legs and altered  voice pattern and walks with a stagger, but does not use a cane or a walker.  Apparently this has been relatively stable since 2000. The MS is  particularly aggravated under stressful situations.   PAST SURGICAL HISTORY:  None.   CURRENT MEDICATIONS:  Betaseron, Neurontin, Prozac, Synthroid, oxybutynin.   ALLERGIES:  DILANTIN AND ZANAFLEX.   FAMILY HISTORY:  Aunt with breast cancer, father with colon cancer.   OBSTETRIC HISTORY:  Gravida 2.   REVIEW OF SYSTEMS:  Otherwise negative.   PHYSICAL EXAMINATION:  VITAL SIGNS: Height 5 feet 4 inches, weight 117  pounds, blood pressure 120/80, pulse 60, respiratory rate 16.  GENERAL: The patient is a pleasant, healthy, slender white female in no  acute distress.  HEENT: Negative.  NECK: Supple without thyromegaly. There is no supraclavicular or inguinal  adenopathy.  ABDOMEN: Soft and nontender without masses, organomegaly, ascites, or  hernias noted.  PELVIC EXAM: EGBUS, vagina, bladder, and urethra are normal. The cervix has  a tumor mass in the os that seems to be somewhat dilated. There also seems  to be some ulceration of the exocervix itself. Bimanual exam reveal the  cervical width to be approximately 3 cm in diameter. The uterus appears to  be normal in shape, size, and consistency. There are no adnexal masses  noted. Rectovaginal exam confirms. There is no parametrial involvement.   IMPRESSION:  Adenocarcinoma arising in a polyp, extruding through the  exocervix. Because the patient has benign endometrial tissue I think it is  important that we establish whether this is a cervical cancer or an  endometrial cancer as the treatment plans would be different. I therefore  recommend that the patient undergo hysteroscopy, cold knife conization, and  D&C. She is strongly desirous of going on a family vacation beginning September 04, 2003,  and therefore we will attempt to have her cold-knife conization  performed by Dr. Elana Alm next week. The patient understands that once the  conization and D&C are accomplished, we will give her more specific  recommendations as to management options.   ADDENDUM:  I contacted Dr. Lavon Paganini office, he is out of town until next  Monday. His nurse indicated that she thought it would be best for [place] to go  ahead and schedule a cold-knife conization so as to expedite pathology  review and allow the patient to go on vacation. Surgery is therefore  scheduled as an outpatient on August 29, 2003.                                               De Blanch, M.D.    DC/MEDQ  D:  08/22/2003  T:  08/22/2003  Job:  [id number]   cc:   S. Kyra Manges, M.D.  [phone] [address]  [place]  [place] [address]  Fax: [phone]   Telford Nab, R.N.  [address]  [place], [place] [address]

## 2010-08-09 NOTE — Procedures (Signed)
[place]  Patient:    Kayla [name], Kayla [name]                        MRN: [id number] Proc. Date: 11/27/99 Adm. Date:  [id number] Disc. Date: [id number] Attending:  Starr Sinclair                           Procedure Report  PROCEDURE:  Esophageal manometry.  ENDOSCOPIST:  Venita Lick. Pleas Koch., M.D.  INDICATION:  [age]-year-old female with dysphagia and GERD.  UPPER ESOPHAGEAL SPHINCTER:  The upper esophageal sphincter pressure was 153 mmHg, above the upper limit of 118, the peak pressure was 201.3 mmHg, above the upper limit at 190, and the pharyngeal peak pressure was low at 40.7 mmHg, below the normal limit of 60.  UPPER ESOPHAGEAL BODY:  Upper esophageal body demonstrated normal peristaltic activity with normal pressures.  LOWER ESOPHAGEAL BODY:  Lower esophageal body demonstrated normal peristaltic activity with 100% peristaltic contractions and normal pressures.  LOWER ESOPHAGEAL SPHINCTER:  Resting pressure of 44.2 mmHg, with a residual pressure of 8.5 mmHg, which was slightly above the standard residual pressure of less than 8 mmHg.  Seventy-four percent relaxation was noted.  IMPRESSION:  Nonspecific motility abnormalities, with a minimally elevated lower esophageal residual pressure and some mild abnormalities in the upper esophageal sphincter as well.  RECOMMENDATION:  Continue antireflux measures and acid-suppression therapy. No specific motility abnormalities are noted.  Will monitor. DD:  12/09/99 TD:  12/09/99 Job: [id number]

## 2010-10-28 ENCOUNTER — Telehealth: Payer: Self-pay | Admitting: Internal Medicine

## 2010-10-28 NOTE — Telephone Encounter (Signed)
Spoke with pt and since MW is schedule is full, pt agree'd to go to the [facility] location to SEE tp at 9:15. Pt is aware of apt dae, time, location.

## 2010-10-29 ENCOUNTER — Encounter: Payer: Self-pay | Admitting: Adult Health

## 2010-10-29 ENCOUNTER — Ambulatory Visit (INDEPENDENT_AMBULATORY_CARE_PROVIDER_SITE_OTHER): Payer: 59 | Admitting: Adult Health

## 2010-10-29 VITALS — BP 100/70 | HR 67 | Temp 98.2°F | Ht 64.0 in | Wt 125.5 lb

## 2010-10-29 DIAGNOSIS — J069 Acute upper respiratory infection, unspecified: Secondary | ICD-10-CM

## 2010-10-29 DIAGNOSIS — R0989 Other specified symptoms and signs involving the circulatory and respiratory systems: Secondary | ICD-10-CM

## 2010-10-29 DIAGNOSIS — R0609 Other forms of dyspnea: Secondary | ICD-10-CM

## 2010-10-29 MED ORDER — ALBUTEROL SULFATE (2.5 MG/3ML) 0.083% IN NEBU
2.5000 mg | INHALATION_SOLUTION | Freq: Once | RESPIRATORY_TRACT | Status: AC
  Administered 2010-10-29: 2.5 mg via RESPIRATORY_TRACT

## 2010-10-29 MED ORDER — CEFDINIR 300 MG PO CAPS: 300.0000 mg | ORAL_CAPSULE | Freq: Two times a day (BID) | ORAL | Status: DC

## 2010-10-29 NOTE — Patient Instructions (Signed)
Omnicef 300mg  Twice daily  For 7 days -take with food, eat yogurt daily while on antibiotics Mucinex DM Twice daily  As needed  Cough/congestion  Fluids and rest.  GERD diet  Take Dulera 2 puffs Twice daily  -brush/rinse and gargle after use.  Please contact office for sooner follow up if symptoms do not improve or worsen or seek emergency care  follow up Dr. Sherene Sires  In 4 weeks and As needed

## 2010-10-29 NOTE — Progress Notes (Signed)
Subjective:    Patient ID: Kayla [name], female    DOB: 14-Aug-1950, 60 y.o.   MRN: [id number]  HPI   35 yowf never smoker with MS since Oct 96 all care through [facility] and Dr Elana Alm in [place].   March 28, 2009 Followup. Pt c/o fatigue and hair loss x 6 months. no change weakness and mild ataxia related to ms.   September 07, 2009 --Presents for work in visit. Pt complains over last 3 months she wears out easily, gets more dyspneic easily. Dyspnea comes on intermittently sometimes at rest, and w/ activity. She can hear herself wheezing. If she overworks herself she can hear wheezing.   October 04, 2009 Followup with PFT's. Pt states that her breathing is no better since last seen in June for acute visit. She states she is still wheezing- ventolin helps and is using this up to tid. w/u c/w anemia  Mild dysphagia no more than usual. occ waking up wheezing at night despite zegerid at hs.    Try  use dulera 100 prn   06/25/2010  Ov using dulera 3x weekly with good ex tol and sleeping ok. No cough. Dysphagia no worse.  Pt denies any significant sore throat,  itching, sneezing,  nasal congestion or excess/ purulent secretions,  fever, chills, sweats, unintended wt loss, pleuritic or exertional cp, hempoptysis, orthopnea pnd or leg swelling.    Also denies any obvious fluctuation of symptoms with weather or environmental changes or other aggravating or alleviating factors.    10/29/2010 Acute ov  Pt presents for an acute work in visit. Complains of sore throat, congestion, cough, thick mucus,  diarrhea, wheezing, chest tightness, increased SOB all of the time, intermittent productive cough with clear mucus x 3 days. Sore throat is getting better. Upset stomach and loose stools better today.  No recent travel or abx use. Cough is quite severe at times. She has wheezing at times after coughing.   She has hx of vocal cord issues and chronic hoarseness followed by Dr. [name], ENT. Informs me she has been seen at  [facility] center in past for vocal cord dysfunction and hoarseness. Has hx of GERD and is on zegerid. Denies reflux symptoms.   No chest pain or vomitting. No fever.  Not using Dulera on regular basis at times    Past Medical History:  MULTIPLE SCLEROSIS (ICD-340)........................Marland Kitchen Dr Trudie Buckler [facility], was at [facility])  - dx 10/96  Sleep Apnea.............................................................Marland Kitchen [name]      - CPAP rx 04/2010 HYPOTHYROIDISM (ICD-244.9)  OTHER DISEASES OF VOCAL CORDS (ICD-478.5)  HOARSENESS (ICD-784.49)................................Marland KitchenJenne Pane  Dyspnea/wheeze ? asthma  - HFA 50 % after coaching October 04, 2009 >  50% 06/25/2010  - PFT's October 04, 2009 FEV1 2.15 (95%) with ratio 69 and DLCO 58> corrects to 98, mild insp truncation  Health Maintenance.............................................Marland KitchenWert  - Pneumovax March 28, 2009  - Td due March 30, 2009                  Review of Systems Constitutional:   No  weight loss, night sweats,  Fevers, chills, fatigue, or  lassitude.  HEENT:   No headaches,  Difficulty swallowing,  Tooth/dental problems,                No sneezing, itching, ear ache, nasal congestion, post nasal drip,   CV:  No chest pain,  Orthopnea, PND, swelling in lower extremities, anasarca, dizziness, palpitations, syncope.   GI  No heartburn, indigestion,  abdominal pain, nausea, vomiting, diarrhea, change in bowel habits, loss of appetite, bloody stools.   Resp:   No coughing up of blood.  No wheezing.  No chest wall deformity  Skin: no rash or lesions.  GU: no dysuria, change in color of urine, no urgency or frequency.  No flank pain, no hematuria   MS:  No joint pain or swelling.  No decreased range of motion.  No back pain.  Psych:  No change in mood or affect. No depression or anxiety.  No memory loss.          Objective:   Physical Exam      ambulatory white female who actually looks about 10  years younger than stated age and walks with a bit of an awkward gait but otherwise looks very healthy, wt 120 -> 118 > 127 March 28, 2009 >129 June 17 > 128 October 04, 2009 > 129 06/25/2010 >125 10/29/2010  HEENT: nl dentition, turbinates, and orophanx. Nl external ear canals without cough reflex  Neck without JVD/Nodes/TM  Lungs pseudowheeze resolves with purse lip maneuver, minimal insp stridor intermittently  RRR no s3 or murmur or increase in P2  Abd soft and benign with nl excursion in the supine position. No bruits or organomegaly  Ext warm without calf tenderness, cyanosis clubbing or edema  Skin warm and dry without lesions Neuro: abn gait c/w MS hx     Assessment & Plan:

## 2010-10-29 NOTE — Assessment & Plan Note (Signed)
xopenex neb given in office  Omnicef 300mg  Twice daily  For 7 days -take with food, eat yogurt daily while on antibiotics Mucinex DM Twice daily  As needed  Cough/congestion  Fluids and rest.  GERD diet  Take Dulera 2 puffs Twice daily  -brush/rinse and gargle after use.  Please contact office for sooner follow up if symptoms do not improve or worsen or seek emergency care  follow up Dr. Sherene Sires  In 4 weeks and As needed

## 2010-11-04 ENCOUNTER — Telehealth: Payer: Self-pay | Admitting: Internal Medicine

## 2010-11-04 MED ORDER — CEFDINIR 300 MG PO CAPS: 300.0000 mg | ORAL_CAPSULE | Freq: Two times a day (BID) | ORAL | Status: AC

## 2010-11-04 NOTE — Telephone Encounter (Signed)
Called spoke with patient, advised of TP's recs.  Pt verbalized her understanding and will call for eval if no better after finishing abx.  rx sent to verified pharmacy.

## 2010-11-04 NOTE — Telephone Encounter (Signed)
May have Omnicef 300mg  Twice daily  For 7 days -take with food (#14)  If not improving will need ov for further evaluation .  Please contact office for sooner follow up if symptoms do not improve or worsen or seek emergency care

## 2010-11-04 NOTE — Telephone Encounter (Signed)
Spoke with the pt and she states she saw TP on 10-29-10 for URI and was given omnicef. She states she takes last dose today. She does not see any imrovement in her symptoms. She is still having the same amount of chest congestion, productive cough with white phlegm, wheezing, increased SOB, and chest tightness. Please advise. [name], CMA Allergies  Allergen Reactions  . Oxycodone-Acetaminophen     REACTION: unspecified  . Phenytoin     REACTION: rash  . Tylox   . Zanaflex (Tizanidine Hydrochloride)   . Dilantin Rash

## 2010-11-12 ENCOUNTER — Other Ambulatory Visit: Payer: Self-pay | Admitting: Internal Medicine

## 2010-11-26 ENCOUNTER — Ambulatory Visit (INDEPENDENT_AMBULATORY_CARE_PROVIDER_SITE_OTHER): Payer: 59 | Admitting: Internal Medicine

## 2010-11-26 ENCOUNTER — Other Ambulatory Visit (INDEPENDENT_AMBULATORY_CARE_PROVIDER_SITE_OTHER): Payer: 59

## 2010-11-26 ENCOUNTER — Encounter: Payer: Self-pay | Admitting: Internal Medicine

## 2010-11-26 VITALS — BP 98/60 | HR 73 | Temp 98.3°F | Ht 64.0 in | Wt 128.6 lb

## 2010-11-26 DIAGNOSIS — J383 Other diseases of vocal cords: Secondary | ICD-10-CM

## 2010-11-26 DIAGNOSIS — R0609 Other forms of dyspnea: Secondary | ICD-10-CM

## 2010-11-26 DIAGNOSIS — E039 Hypothyroidism, unspecified: Secondary | ICD-10-CM

## 2010-11-26 DIAGNOSIS — R0989 Other specified symptoms and signs involving the circulatory and respiratory systems: Secondary | ICD-10-CM

## 2010-11-26 DIAGNOSIS — R300 Dysuria: Secondary | ICD-10-CM

## 2010-11-26 DIAGNOSIS — G35 Multiple sclerosis: Secondary | ICD-10-CM

## 2010-11-26 DIAGNOSIS — M949 Disorder of cartilage, unspecified: Secondary | ICD-10-CM

## 2010-11-26 DIAGNOSIS — M549 Dorsalgia, unspecified: Secondary | ICD-10-CM

## 2010-11-26 DIAGNOSIS — M899 Disorder of bone, unspecified: Secondary | ICD-10-CM

## 2010-11-26 LAB — URINALYSIS
Bilirubin Urine: NEGATIVE
Hgb urine dipstick: NEGATIVE
Ketones, ur: NEGATIVE
Leukocytes, UA: NEGATIVE
Nitrite: NEGATIVE
Specific Gravity, Urine: 1.01 (ref 1.000–1.030)
Total Protein, Urine: NEGATIVE
Urine Glucose: NEGATIVE
Urobilinogen, UA: 1 (ref 0.0–1.0)
pH: 7.5 (ref 5.0–8.0)

## 2010-11-26 MED ORDER — CIPROFLOXACIN HCL 500 MG PO TABS: ORAL_TABLET | ORAL | Status: DC

## 2010-11-26 NOTE — Progress Notes (Signed)
Subjective:    Patient ID: Kayla [name], female    DOB: [birth year], 60 y.o.   MRN: [id number]  HPI   72 yowf never smoker with MS since Oct 96 all neuro care through [facility] neuro.  October 04, 2009 Followup with PFT's. Pt states that her breathing is no better since last seen in June for acute visit. She states she is still wheezing- ventolin helps and is using this up to tid. w/u c/w anemia  Mild dysphagia no more than usual. occ waking up wheezing at night despite zegerid at hs.   rec  Try  use dulera 100 prn   06/25/2010  Ov using dulera 3x weekly with good ex tol and sleeping ok. No cough. Dysphagia no worse.    rec Call if breathing worsens despite using the dulera  Work on inhaler technique   10/29/2010 Acute ov  Pt presents for an acute work in visit. Complains of sore throat, congestion, cough, thick mucus,  diarrhea, wheezing, chest tightness, increased SOB all of the time, intermittent productive cough with clear mucus x 3 days. Sore throat is getting better. Upset stomach and loose stools better today.  No recent travel or abx use. Cough is quite severe at times. She has wheezing at times after coughing.   She has hx of vocal cord issues and chronic hoarseness followed by Kayla. Kayla [name], ENT. Informs me she has been seen at [facility] in past for vocal cord dysfunction and hoarseness. Has hx of GERD and is on zegerid. Denies reflux symptoms.   Not using Dulera on regular basis at times  rec rec Omnicef 300mg  Twice daily  For 7 days -take with food, eat yogurt daily while on antibiotics Mucinex DM Twice daily  As needed  Cough/congestion  Fluids and rest.  GERD diet  Take Dulera 2 puffs Twice daily  -brush/rinse and gargle after use.  11/26/2010 f/u ov/Kayla [name] cc cough/sob much better, back to baseline hoarseness, doe. Using dulera prn but doing well with this strategy. New back pain x 24 h exactly the same as previous uti's which are not typically assoc with fever, dysuria.  No positional component, not better with aleve or heat.  Pt denies any significant sore throat, dysphagia, itching, sneezing,  nasal congestion or excess/ purulent secretions,  fever, chills, sweats, unintended wt loss, pleuritic or exertional cp, hempoptysis, orthopnea pnd or leg swelling.  Also denies presyncope, palpitations, heartburn, abdominal pain, nausea, vomiting, diarrhea  or change in bowel or urinary habits, dysuria,hematuria,  rash, arthralgias, visual complaints, headache, numbness weakness or ataxia.  Also denies any obvious fluctuation of symptoms with weather or environmental changes or other aggravating or alleviating factors.     Past Medical History:  MULTIPLE SCLEROSIS (ICD-340)........................Marland Kitchen Kayla [name] [facility], was at [facility])  - dx 10/96  Sleep Apnea.............................................................Marland Kitchen Kayla [name]      - CPAP rx 04/2010 HYPOTHYROIDISM (ICD-244.9)  OTHER DISEASES OF VOCAL CORDS (ICD-478.5)  Cervical Cancer > complete hysterectomy...........Marland Kitchen Kayla [name] HOARSENESS (ICD-784.49)................................Marland KitchenJenne [name]  Dyspnea/wheeze ? asthma  - HFA 50 % after coaching October 04, 2009 >  50% 06/25/2010  - PFT's October 04, 2009 FEV1 2.15 (95%) with ratio 69 and DLCO 58> corrects to 98, mild insp truncation  Health Maintenance.............................................Marland KitchenWert  - Pneumovax March 28, 2009  - Td  April 10, 2009                            Objective:  Physical Exam  ambulatory white female who actually looks about 10 years younger than stated age and walks with a bit of an awkward gait but otherwise looks very healthy Wt  118 > 127 March 28, 2009 >129 June 17 > 128 October 04, 2009 > 129 06/25/2010 >125 10/29/2010 > 128 11/26/2010  HEENT: nl dentition, turbinates, and orophanx. Nl external ear canals without cough reflex  Neck without JVD/Nodes/TM  Lungs pseudowheeze resolves with purse lip  maneuver, minimal insp stridor intermittently  RRR no s3 or murmur or increase in P2  Abd soft and benign with nl excursion in the supine position. No bruits or organomegaly / no cvat Ext warm without calf tenderness, cyanosis clubbing or edema  Skin warm and dry without lesions Neuro: abn gait c/w MS hx - neg slr MS  Mild tenderness low lumbar/ sacral area  U/a completely nl  Assessment & Plan:

## 2010-11-26 NOTE — Patient Instructions (Signed)
Set up appointment to see Kayla [name] in 2 weeks to get the Prolia injection in [facility]  We will call you with the urine evaluation  Cipro 500 one half twice daily should take care of any urinary infections  I do recommend gyn follow up unless Dr Stanford Breed recommends otherwise

## 2010-11-27 DIAGNOSIS — M549 Dorsalgia, unspecified: Secondary | ICD-10-CM | POA: Insufficient documentation

## 2010-11-27 NOTE — Assessment & Plan Note (Signed)
U/a is completely unremarkable, rec she use heat/ nsaids and f/u prn

## 2010-11-27 NOTE — Assessment & Plan Note (Signed)
Despite very minimal exp airflow obstruction on pft's  She's doing well on dulera which is a bit of a double edged sword as it has the potential for adverse effects on the upper airway.    Each maintenance medication was reviewed in detail including most importantly the difference between maintenance and as needed and under what circumstances the prns are to be used.  Please see instructions for details which were reviewed in writing and the patient given a copy.

## 2010-11-27 NOTE — Assessment & Plan Note (Signed)
Wants to shift to our service for prolia. Advised to bring in the dexa data and see Tammy NP to discuss options.

## 2010-11-27 NOTE — Assessment & Plan Note (Signed)
Adequate control on present rx, reviewed  

## 2010-12-10 ENCOUNTER — Ambulatory Visit (INDEPENDENT_AMBULATORY_CARE_PROVIDER_SITE_OTHER): Payer: 59 | Admitting: Adult Health

## 2010-12-10 ENCOUNTER — Other Ambulatory Visit (INDEPENDENT_AMBULATORY_CARE_PROVIDER_SITE_OTHER): Payer: 59

## 2010-12-10 ENCOUNTER — Encounter: Payer: Self-pay | Admitting: Adult Health

## 2010-12-10 VITALS — BP 110/64 | HR 64 | Temp 97.5°F | Ht 63.0 in | Wt 127.0 lb

## 2010-12-10 DIAGNOSIS — M899 Disorder of bone, unspecified: Secondary | ICD-10-CM

## 2010-12-10 DIAGNOSIS — Z23 Encounter for immunization: Secondary | ICD-10-CM

## 2010-12-10 DIAGNOSIS — M810 Age-related osteoporosis without current pathological fracture: Secondary | ICD-10-CM

## 2010-12-10 DIAGNOSIS — M949 Disorder of cartilage, unspecified: Secondary | ICD-10-CM

## 2010-12-10 LAB — BASIC METABOLIC PANEL
BUN: 15 mg/dL (ref 6–23)
CO2: 28 mEq/L (ref 19–32)
Calcium: 9.3 mg/dL (ref 8.4–10.5)
Chloride: 101 mEq/L (ref 96–112)
Creatinine, Ser: 0.7 mg/dL (ref 0.4–1.2)
GFR: 89.75 mL/min (ref >60.00)
Glucose, Bld: 111 mg/dL — ABNORMAL HIGH (ref 70–99)
Potassium: 4.1 mEq/L (ref 3.5–5.1)
Sodium: 135 mEq/L (ref 135–145)

## 2010-12-10 NOTE — Progress Notes (Signed)
Subjective:    Patient ID: Kayla [name], female    DOB: Jul 30, 1950, 60 y.o.   MRN: [id number]  HPI   39 yowf never smoker with MS since Oct 96 all neuro care through [facility] neuro.  October 04, 2009 Followup with PFT's. Pt states that her breathing is no better since last seen in June for acute visit. She states she is still wheezing- ventolin helps and is using this up to tid. w/u c/w anemia  Mild dysphagia no more than usual. occ waking up wheezing at night despite zegerid at hs.   rec  Try  use dulera 100 prn   06/25/2010  Ov using dulera 3x weekly with good ex tol and sleeping ok. No cough. Dysphagia no worse.    rec Call if breathing worsens despite using the dulera  Work on inhaler technique   10/29/2010 Acute ov  Pt presents for an acute work in visit. Complains of sore throat, congestion, cough, thick mucus,  diarrhea, wheezing, chest tightness, increased SOB all of the time, intermittent productive cough with clear mucus x 3 days. Sore throat is getting better. Upset stomach and loose stools better today.  No recent travel or abx use. Cough is quite severe at times. She has wheezing at times after coughing.   She has hx of vocal cord issues and chronic hoarseness followed by Dr. [name], ENT. Informs me she has been seen at [facility] medical center in past for vocal cord dysfunction and hoarseness. Has hx of GERD and is on zegerid. Denies reflux symptoms.   Not using Dulera on regular basis at times  rec rec Omnicef 300mg  Twice daily  For 7 days -take with food, eat yogurt daily while on antibiotics Mucinex DM Twice daily  As needed  Cough/congestion  Fluids and rest.  GERD diet  Take Dulera 2 puffs Twice daily  -brush/rinse and gargle after use.  11/26/2010 f/u ov/[name] cc cough/sob much better, back to baseline hoarseness, doe. Using dulera prn but doing well with this strategy. New back pain x 24 h exactly the same as previous uti's which are not typically assoc with fever, dysuria.  No positional component, not better with aleve or heat. >>Cipro rx , UA clear   12/10/2010 Follow up  Pt states she is here to get her prolia injection.  She has hx of Osteoporosis previously receiving Prolia by GYN Dr. Elana Alm but he has retired. ?last BMD in past . Will need to obtain records from their office.  She has not hx of No compression fx , or other fx. Not on steroids . Discussed options for Prolia and Reclast. Will notify after obtain BMD and records from GYN office.      Past Medical History:  MULTIPLE SCLEROSIS (ICD-340)........................Marland Kitchen Dr Trudie Buckler [facility], was at [facility])  - dx 10/96  Sleep Apnea.............................................................Marland Kitchen [name]      - CPAP rx 04/2010 HYPOTHYROIDISM (ICD-244.9)  OTHER DISEASES OF VOCAL CORDS (ICD-478.5)  Cervical Cancer > complete hysterectomy...........Marland Kitchen [name] HOARSENESS (ICD-784.49)................................Marland KitchenJenne Pane  Dyspnea/wheeze ? asthma  - HFA 50 % after coaching October 04, 2009 >  50% 06/25/2010  - PFT's October 04, 2009 FEV1 2.15 (95%) with ratio 69 and DLCO 58> corrects to 98, mild insp truncation  Health Maintenance.............................................Marland KitchenWert  - Pneumovax March 28, 2009  - Td  April 10, 2009        ROS Constitutional:   No  weight loss, night sweats,  Fevers, chills, fatigue, or  lassitude.  HEENT:  No headaches,  Difficulty swallowing,  Tooth/dental problems, or  Sore throat,                No sneezing, itching, ear ache, nasal congestion, post nasal drip,   CV:  No chest pain,  Orthopnea, PND, swelling in lower extremities, anasarca, dizziness, palpitations, syncope.   GI  No heartburn, indigestion, abdominal pain, nausea, vomiting, diarrhea, change in bowel habits, loss of appetite, bloody stools.   Resp: No shortness of breath with exertion or at rest.  No excess mucus, no productive cough,  No non-productive cough,  No coughing up  of blood.  No change in color of mucus.  No wheezing.  No chest wall deformity  Skin: no rash or lesions.  GU: no dysuria, change in color of urine, no urgency or frequency.  No flank pain, no hematuria   MS:  No joint pain or swelling.  No decreased range of motion.  No back pain.  Psych:  No change in mood or affect. No depression or anxiety.  No memory loss.                         Objective:   Physical Exam  ambulatory white female who actually looks about 10 years younger than stated age and walks with a bit of an awkward gait but otherwise looks very healthy Wt  118 > 127 March 28, 2009 >129 June 17 > 128 October 04, 2009 > 129 06/25/2010 >125 10/29/2010 > 128 11/26/2010 >>127 12/10/2010  HEENT: nl dentition, turbinates, and orophanx. Nl external ear canals without cough reflex  Neck without JVD/Nodes/TM  Lungs clear  RRR no s3 or murmur or increase in P2  Abd soft and benign with nl excursion in the supine position. No bruits or organomegaly / no cvat Ext warm without calf tenderness, cyanosis clubbing or edema  Skin warm and dry without lesions Neuro: abn gait c/w MS hx        Assessment & Plan:

## 2010-12-10 NOTE — Patient Instructions (Signed)
I will get in touch regarding Reclast and Prolia  Flu shot today.  follow up Dr. Sherene Sires  As planned

## 2010-12-14 DIAGNOSIS — M810 Age-related osteoporosis without current pathological fracture: Secondary | ICD-10-CM | POA: Insufficient documentation

## 2010-12-14 NOTE — Assessment & Plan Note (Signed)
Will obtain previous BMD and GYN office notes Call pt back regarding her choices for prolia or reclast.

## 2010-12-23 ENCOUNTER — Encounter: Payer: Self-pay | Admitting: Adult Health

## 2011-03-10 ENCOUNTER — Telehealth: Payer: Self-pay | Admitting: Internal Medicine

## 2011-03-10 NOTE — Telephone Encounter (Signed)
I spoke with pt and she states she had diarrhea last night but now is gone. Pt wanted to know if it comes back what she can take. i advised her to try OTC immodium. Pt also states she is having some nasal congestion and wanted to know what OTC medication that would help with that. I advised her to try saline nasal spray. Pt voiced her understanding and needed nothing further. Pt aware to call back of these recs does not help

## 2011-05-14 ENCOUNTER — Other Ambulatory Visit: Payer: Self-pay | Admitting: Dermatology

## 2011-05-20 ENCOUNTER — Other Ambulatory Visit: Payer: Self-pay | Admitting: Internal Medicine

## 2011-05-23 ENCOUNTER — Telehealth: Payer: Self-pay | Admitting: Internal Medicine

## 2011-05-23 MED ORDER — LEVOTHYROXINE SODIUM 100 MCG PO TABS: 100.0000 ug | ORAL_TABLET | Freq: Every day | ORAL | Status: DC

## 2011-05-23 NOTE — Telephone Encounter (Signed)
Rx refilled- LMOM so pt will be made aware.

## 2011-10-27 ENCOUNTER — Other Ambulatory Visit: Payer: Self-pay | Admitting: Internal Medicine

## 2011-10-27 MED ORDER — LEVOTHYROXINE SODIUM 100 MCG PO TABS: 100.0000 ug | ORAL_TABLET | Freq: Every day | ORAL | Status: DC

## 2011-10-27 NOTE — Telephone Encounter (Signed)
I spoke with pt and is aware refill for synthroid has been sent and needs to keep OV fro further refill.s

## 2011-11-17 ENCOUNTER — Telehealth: Payer: Self-pay | Admitting: Internal Medicine

## 2011-11-17 ENCOUNTER — Ambulatory Visit (INDEPENDENT_AMBULATORY_CARE_PROVIDER_SITE_OTHER): Payer: 59 | Admitting: Internal Medicine

## 2011-11-17 ENCOUNTER — Encounter: Payer: Self-pay | Admitting: Internal Medicine

## 2011-11-17 ENCOUNTER — Other Ambulatory Visit (INDEPENDENT_AMBULATORY_CARE_PROVIDER_SITE_OTHER): Payer: 59

## 2011-11-17 VITALS — BP 118/66 | HR 86 | Temp 98.0°F | Ht 64.0 in | Wt 129.2 lb

## 2011-11-17 DIAGNOSIS — M949 Disorder of cartilage, unspecified: Secondary | ICD-10-CM

## 2011-11-17 DIAGNOSIS — M899 Disorder of bone, unspecified: Secondary | ICD-10-CM

## 2011-11-17 DIAGNOSIS — E039 Hypothyroidism, unspecified: Secondary | ICD-10-CM

## 2011-11-17 DIAGNOSIS — M79609 Pain in unspecified limb: Secondary | ICD-10-CM

## 2011-11-17 DIAGNOSIS — M79669 Pain in unspecified lower leg: Secondary | ICD-10-CM

## 2011-11-17 DIAGNOSIS — R0609 Other forms of dyspnea: Secondary | ICD-10-CM

## 2011-11-17 DIAGNOSIS — R0989 Other specified symptoms and signs involving the circulatory and respiratory systems: Secondary | ICD-10-CM

## 2011-11-17 DIAGNOSIS — M549 Dorsalgia, unspecified: Secondary | ICD-10-CM

## 2011-11-17 LAB — TSH: TSH: 0.57 u[IU]/mL (ref 0.35–5.50)

## 2011-11-17 LAB — HEPATIC FUNCTION PANEL
ALT: 16 U/L (ref 0–35)
AST: 28 U/L (ref 0–37)
Albumin: 4.2 g/dL (ref 3.5–5.2)
Alkaline Phosphatase: 60 U/L (ref 39–117)
Bilirubin, Direct: 0.1 mg/dL (ref 0.0–0.3)
Total Bilirubin: 0.6 mg/dL (ref 0.3–1.2)
Total Protein: 6.8 g/dL (ref 6.0–8.3)

## 2011-11-17 LAB — BASIC METABOLIC PANEL
BUN: 19 mg/dL (ref 6–23)
CO2: 23 mEq/L (ref 19–32)
Calcium: 9.5 mg/dL (ref 8.4–10.5)
Chloride: 98 mEq/L (ref 96–112)
Creatinine, Ser: 0.7 mg/dL (ref 0.4–1.2)
GFR: 86.63 mL/min (ref >60.00)
Glucose, Bld: 55 mg/dL — ABNORMAL LOW (ref 70–99)
Potassium: 4.8 mEq/L (ref 3.5–5.1)
Sodium: 132 mEq/L — ABNORMAL LOW (ref 135–145)

## 2011-11-17 NOTE — Progress Notes (Signed)
Subjective:    Patient ID: Kayla [name], female    DOB: 1950/04/19   MRN: [id number]    Brief patient profile:  5  yowf never smoker with MS since Oct 96 all neuro care through [facility] neuro.  October 04, 2009 Followup with PFT's. Pt states that her breathing is no better since last seen June 2011 for acute visit. She states she is still wheezing- ventolin helps and is using this up to tid. w/u c/w anemia  Mild dysphagia no more than usual. occ waking up wheezing at night despite zegerid at hs.   rec  Try  use dulera 100 prn   06/25/2010  Ov using dulera 3x weekly with good ex tol and sleeping ok. No cough. Dysphagia no worse.    rec Call if breathing worsens despite using the dulera Work on inhaler technique       12/10/2010 Follow up/NP ov Pt states she is here to get her prolia injection.  She has hx of Osteoporosis previously receiving Prolia by GYN Dr. Elana Alm but he has retired. ?last BMD in past . Will need to obtain records from their office.  She has no hx of No compression fx , or other fx. Not on steroids . Discussed options for Prolia and Reclast. Will notify after obtain BMD and records from GYN office.   rec Drug holiday, revisit in 02/2012   11/17/2011 f/u ov/Franck Vinal in for f/u hypothyroidism cc 2 week L calf pain, worse when walk, radiates to achilles tendon, already on naprosyn twice daily per [name] x 2 years with meals as a maint, not prn, no abd c/o's x excess flatus.  No h/o leg injury or swelling. Fully mobile and able to do aerobics as long as not bearing a lot of weight.  No assoc cramps  Sleeping ok without nocturnal  or early am exacerbation  of respiratory  c/o's or need for noct saba. Also denies any obvious fluctuation of symptoms with weather or environmental changes or other aggravating or alleviating factors except as outlined above   ROS  The following are not active complaints unless bolded sore throat, dysphagia, dental problems, itching, sneezing,  nasal  congestion or excess/ purulent secretions, ear ache,   fever, chills, sweats, unintended wt loss, pleuritic or exertional cp, hemoptysis,  orthopnea pnd or leg swelling, presyncope, palpitations, heartburn, abdominal pain, anorexia, nausea, vomiting, diarrhea  or change in bowel or urinary habits, change in stools or urine, dysuria,hematuria,  rash, arthralgias, visual complaints, headache, numbness weakness or ataxia or problems with walking or coordination,  change in mood/affect or memory.        Past Medical History:  MULTIPLE SCLEROSIS (ICD-340)........................Marland Kitchen Dr Trudie Buckler [facility], was at [facility])  - dx 10/96  Sleep Apnea.............................................................Marland Kitchen [name]      - CPAP rx 04/2010 HYPOTHYROIDISM (ICD-244.9)  OTHER DISEASES OF VOCAL CORDS (ICD-478.5)  Cervical Cancer > complete hysterectomy...........Marland Kitchen [name] HOARSENESS (ICD-784.49)................................Marland KitchenJenne Pane  Dyspnea/wheeze ? asthma  - HFA 50 % after coaching October 04, 2009 >  50% 06/25/2010  - PFT's October 04, 2009 FEV1 2.15 (95%) with ratio 69 and DLCO 58> corrects to 98, mild insp truncation  Health Maintenance.............................................Marland KitchenWert  - Pneumovax March 28, 2009  - Td  April 10, 2009        Objective:   Physical Exam  ambulatory white female who actually looks about 10 years younger than stated age and walks with a bit of an awkward gait but otherwise looks very healthy Wt  118 > 127 March 28, 2009 >129 June 17 > 128 October 04, 2009 > 129 06/25/2010 >125 10/29/2010 > 128 11/26/2010 >>127 12/10/2010 > 11/17/2011  129 HEENT: nl dentition, turbinates, and orophanx. Nl external ear canals without cough reflex  Neck without JVD/Nodes/TM  Lungs clear  RRR no s3 or murmur or increase in P2  Abd soft and benign with nl excursion in the supine position. No bruits or organomegaly / no cvat Ext warm without calf tenderness, cyanosis  clubbing or edema  Skin warm and dry without lesions Neuro: abn gait c/w MS hx  MS min mid calf tenderness on L, Pos Homan's, no pitting or palpable cords. Nl pulses. From ankle and knee.       Assessment & Plan:

## 2011-11-17 NOTE — Patient Instructions (Addendum)
Please remember to go to the lab  department downstairs for your tests - we will call you with the results when they are available.    Please see patient coordinator before you leave today  to schedule venous doppler L leg only  See Dr Artis Flock regarding leg pain

## 2011-11-17 NOTE — Assessment & Plan Note (Signed)
Lab Results  Component Value Date   TSH 0.57 11/17/2011    Adequate control on present rx, reviewed

## 2011-11-17 NOTE — Telephone Encounter (Signed)
Call patient : Studies are unremarkable, no change in recs   I spoke with patient about results and she verbalized understanding and had no questions 

## 2011-11-17 NOTE — Assessment & Plan Note (Signed)
-   HFA 50 % after coaching October 04, 2009 >  Better on dulera 100 > hfa  50% 06/25/2010  - PFT's October 04, 2009 FEV1 2.15 (95%) with ratio 69 and DLCO 58> corrects to 98, mild insp truncation   pft's c/w min airflow obstruction but doing very well on dulera 2 bid and no need for saba so no change rx

## 2011-11-17 NOTE — Assessment & Plan Note (Signed)
No evidence of dvt x pos homan's > most likely this is from tendonitis but she is already on "maint "  nsaids > needs ortho eval p venous dopplers

## 2011-11-17 NOTE — Assessment & Plan Note (Signed)
Followed as Primary Care Patient/ [name] [facility] Previously managed by GYN Dr. Elana Alm on oral  Bisphonates for many years then changed to Prolia- last injection 05/2010  BMD 08/14/09 Left Neck T score -2.9 , Spine nml .(no T score provided) , recommended drug holiday.  >>12/23/2010 BMD scores reviewed, discussion for Drug holiday. Will repeat BMD in 02/2012  Vit d levels pending

## 2011-11-18 ENCOUNTER — Encounter (INDEPENDENT_AMBULATORY_CARE_PROVIDER_SITE_OTHER): Payer: 59

## 2011-11-18 DIAGNOSIS — M79609 Pain in unspecified limb: Secondary | ICD-10-CM

## 2011-11-18 DIAGNOSIS — M79669 Pain in unspecified lower leg: Secondary | ICD-10-CM

## 2011-11-18 LAB — VITAMIN D 25 HYDROXY (VIT D DEFICIENCY, FRACTURES): Vit D, 25-Hydroxy: 49 ng/mL (ref 30–89)

## 2011-11-19 ENCOUNTER — Encounter: Payer: Self-pay | Admitting: Internal Medicine

## 2011-11-19 NOTE — Progress Notes (Signed)
Quick Note:  Called and spoke with patient informed her of results/recs as listed below per Dr. Sherene Sires. Patient verbalized understanding and nothing further needed at this time. ______

## 2011-11-20 ENCOUNTER — Telehealth: Payer: Self-pay | Admitting: Internal Medicine

## 2011-11-20 MED ORDER — MOMETASONE FURO-FORMOTEROL FUM 100-5 MCG/ACT IN AERO: 2.0000 | INHALATION_SPRAY | Freq: Two times a day (BID) | RESPIRATORY_TRACT | Status: DC

## 2011-11-20 MED ORDER — ALBUTEROL SULFATE HFA 108 (90 BASE) MCG/ACT IN AERS: 2.0000 | INHALATION_SPRAY | Freq: Four times a day (QID) | RESPIRATORY_TRACT | Status: DC | PRN

## 2011-11-20 NOTE — Telephone Encounter (Signed)
I spoke with pt and is aware rx's have been sent to [facility]. Nothing further was needed

## 2012-02-11 ENCOUNTER — Ambulatory Visit (INDEPENDENT_AMBULATORY_CARE_PROVIDER_SITE_OTHER): Payer: 59

## 2012-02-11 DIAGNOSIS — Z23 Encounter for immunization: Secondary | ICD-10-CM

## 2012-03-02 ENCOUNTER — Telehealth: Payer: Self-pay | Admitting: Internal Medicine

## 2012-03-02 MED ORDER — LEVOTHYROXINE SODIUM 100 MCG PO TABS: 100.0000 ug | ORAL_TABLET | Freq: Every day | ORAL | Status: DC

## 2012-03-02 NOTE — Telephone Encounter (Signed)
Pt last seen by MW 8.26.13.  Called spoke with patient, verified synthroid dose.  Synthroid once daily #30 w/ 0 refills sent to [facility]. Synthroid 100mg  once daily #90 w/ 3 refills sent to [facility].  Nothing further needed; will sign off.

## 2012-05-20 ENCOUNTER — Encounter: Payer: Self-pay | Admitting: Adult Health

## 2012-05-20 ENCOUNTER — Ambulatory Visit (INDEPENDENT_AMBULATORY_CARE_PROVIDER_SITE_OTHER): Payer: 59 | Admitting: Adult Health

## 2012-05-20 ENCOUNTER — Other Ambulatory Visit (INDEPENDENT_AMBULATORY_CARE_PROVIDER_SITE_OTHER): Payer: 59

## 2012-05-20 VITALS — BP 112/72 | HR 74 | Temp 97.8°F | Ht 64.0 in | Wt 126.0 lb

## 2012-05-20 DIAGNOSIS — E871 Hypo-osmolality and hyponatremia: Secondary | ICD-10-CM

## 2012-05-20 LAB — BASIC METABOLIC PANEL
BUN: 12 mg/dL (ref 6–23)
CO2: 27 mEq/L (ref 19–32)
Calcium: 9.5 mg/dL (ref 8.4–10.5)
Chloride: 95 mEq/L — ABNORMAL LOW (ref 96–112)
Creatinine, Ser: 0.7 mg/dL (ref 0.4–1.2)
GFR: 89.3 mL/min (ref >60.00)
Glucose, Bld: 95 mg/dL (ref 70–99)
Potassium: 4.6 mEq/L (ref 3.5–5.1)
Sodium: 130 mEq/L — ABNORMAL LOW (ref 135–145)

## 2012-05-20 NOTE — Assessment & Plan Note (Signed)
Mild Hyponatremia -no acute drop and asymptomatic  Appears to be a slow decline ? Etiology  Will check labs with serum osmolarity , urine osmolarity and Na+   Plan  Fluid restriction  Recheck bmet in 4 weeks

## 2012-05-20 NOTE — Progress Notes (Signed)
Subjective:    Patient ID: Kayla [name], female    DOB: 1950-10-27   MRN: [id number]    Brief patient profile:  86  yowf never smoker with MS since Oct 96 all neuro care through [facility] neuro.  October 04, 2009 Followup with PFT's. Pt states that her breathing is no better since last seen June 2011 for acute visit. She states she is still wheezing- ventolin helps and is using this up to tid. w/u c/w anemia  Mild dysphagia no more than usual. occ waking up wheezing at night despite zegerid at hs.   rec  Try  use dulera 100 prn   06/25/2010  Ov using dulera 3x weekly with good ex tol and sleeping ok. No cough. Dysphagia no worse.    rec Call if breathing worsens despite using the dulera Work on inhaler technique       12/10/2010 Follow up/NP ov Pt states she is here to get her prolia injection.  She has hx of Osteoporosis previously receiving Prolia by GYN Dr. Elana Alm but he has retired. ?last BMD in past . Will need to obtain records from their office.  She has no hx of No compression fx , or other fx. Not on steroids . Discussed options for Prolia and Reclast. Will notify after obtain BMD and records from GYN office.   rec Drug holiday, revisit in 02/2012   11/17/2011 f/u ov/[name] in for f/u hypothyroidism cc 2 week L calf pain, worse when walk, radiates to achilles tendon, already on naprosyn twice daily per [name] x 2 years with meals as a maint, not prn, no abd c/o's x excess flatus.  No h/o leg injury or swelling. Fully mobile and able to do aerobics as long as not bearing a lot of weight.  No assoc cramps >ven doppler neg   05/20/2012 Follow up  Referred from her neurologist for low sodium level.  Seen at neuro on 2/4 w/ routine labs noted low sodium at 127. She is asymptomatic w/ no increased fatigue, muscle weakness ,  No tremors /seizure activity, confusion  Last routine labs in 10/2011 showed NA at 132.  She is on no diuretics. No dyspnea or cough.  No new meds over last 12  months.  She denies any polydipsia , excessive thirst or polyuria.  Drinks on average 5-6 glasses of water daily .  2 cups of coffee.  Has a hx of MS followed by neurology , no recent change in rx.     ROS  Constitutional:   No  weight loss, night sweats,  Fevers, chills, fatigue, or  lassitude.  HEENT:   No headaches,  Difficulty swallowing,  Tooth/dental problems, or  Sore throat,                No sneezing, itching, ear ache, nasal congestion, post nasal drip,   CV:  No chest pain,  Orthopnea, PND, swelling in lower extremities, anasarca, dizziness, palpitations, syncope.   GI  No heartburn, indigestion, abdominal pain, nausea, vomiting, diarrhea, change in bowel habits, loss of appetite, bloody stools.   Resp: No shortness of breath with exertion or at rest.  No excess mucus, no productive cough,  No non-productive cough,  No coughing up of blood.  No change in color of mucus.  No wheezing.  No chest wall deformity  Skin: no rash or lesions.  GU: no dysuria, change in color of urine, no urgency or frequency.  No flank pain, no hematuria  MS:     No back pain.  Psych:  No change in mood or affect. No depression or anxiety.  No memory loss.            Past Medical History:  MULTIPLE SCLEROSIS (ICD-340)........................Marland Kitchen Dr Trudie Buckler [facility], was at [facility])  - dx 10/96  Sleep Apnea.............................................................Marland Kitchen [name]      - CPAP rx 04/2010 HYPOTHYROIDISM (ICD-244.9)  OTHER DISEASES OF VOCAL CORDS (ICD-478.5)  Cervical Cancer > complete hysterectomy...........Marland Kitchen [name] HOARSENESS (ICD-784.49)................................Marland KitchenJenne Pane  Dyspnea/wheeze ? asthma  - HFA 50 % after coaching October 04, 2009 >  50% 06/25/2010  - PFT's October 04, 2009 FEV1 2.15 (95%) with ratio 69 and DLCO 58> corrects to 98, mild insp truncation  Health Maintenance.............................................Marland KitchenWert  - Pneumovax  March 28, 2009  - Td  April 10, 2009        Objective:   Physical Exam  ambulatory white female  Wt  118 > 127 March 28, 2009 >129 June 17 > 128 October 04, 2009 > 129 06/25/2010 >125 10/29/2010 > 128 11/26/2010 >>127 12/10/2010 > 11/17/2011  129> 126 05/20/2012  HEENT: nl dentition, turbinates, and orophanx. Nl external ear canals without cough reflex  Neck without JVD/Nodes/TM  Lungs clear  RRR no s3 or murmur or increase in P2  Abd soft and benign with nl excursion in the supine position. No bruits or organomegaly / no cvat Ext warm without calf tenderness, cyanosis clubbing or edema  Skin warm and dry without lesions Neuro: abn gait c/w MS hx      Assessment & Plan:

## 2012-05-20 NOTE — Patient Instructions (Addendum)
Decrease water intake  I will call with lab results.  follow up Dr. Sherene Sires  In 4-6 weeks and As needed

## 2012-05-21 LAB — OSMOLALITY: Osmolality: 274 mOsm/kg — ABNORMAL LOW (ref 275–300)

## 2012-05-21 LAB — OSMOLALITY, URINE: Osmolality, Ur: 382 mOsm/kg — ABNORMAL LOW (ref 390–1090)

## 2012-05-21 LAB — SODIUM, URINE, RANDOM: Sodium, Ur: 47 mEq/L

## 2012-05-21 NOTE — Progress Notes (Signed)
Quick Note:  Called spoke with patient, advised of lab results / recs as stated by TP. Pt verbalized her understanding and denied any questions. Pt has upcoming ov w/ MW 3.31.14 @ 1000, note added to appt for repeat bmet. ______

## 2012-05-21 NOTE — Progress Notes (Signed)
Quick Note:  Spoke with pt and notified of results per Dr. [name]. Pt verbalized understanding and denied any questions.  ______ 

## 2012-05-24 NOTE — Progress Notes (Signed)
Quick Note:  Patient returned call. Advised of lab results / recs as stated by TP. Pt verbalized understanding and denied any questions. ______ 

## 2012-05-24 NOTE — Progress Notes (Signed)
Quick Note:  LMOM TCB x1. ______ 

## 2012-06-17 ENCOUNTER — Telehealth: Payer: Self-pay | Admitting: Internal Medicine

## 2012-06-17 NOTE — Telephone Encounter (Signed)
Ok - Reset the f/u ov to 3 months since the ov with Tammy

## 2012-06-17 NOTE — Telephone Encounter (Signed)
I spoke with pt. Her appt has been cancelled. She wanted to be transferred upfront to r/s her appt. Nothing further was needed

## 2012-06-17 NOTE — Telephone Encounter (Signed)
I spoke with pt. She stated when she went back to see her MS doctor he retested her sodium level and everything was back to normal. She is wanting to know if she still needs to keep her appt with Dr. Sherene Sires on 06/21/12. Please advise thanks

## 2012-06-21 ENCOUNTER — Ambulatory Visit: Payer: 59 | Admitting: Internal Medicine

## 2012-07-29 ENCOUNTER — Ambulatory Visit (INDEPENDENT_AMBULATORY_CARE_PROVIDER_SITE_OTHER): Payer: 59 | Admitting: Internal Medicine

## 2012-07-29 ENCOUNTER — Other Ambulatory Visit (INDEPENDENT_AMBULATORY_CARE_PROVIDER_SITE_OTHER): Payer: 59

## 2012-07-29 ENCOUNTER — Encounter: Payer: Self-pay | Admitting: Internal Medicine

## 2012-07-29 VITALS — BP 140/80 | HR 64 | Temp 98.4°F | Ht 64.0 in | Wt 128.6 lb

## 2012-07-29 DIAGNOSIS — R0609 Other forms of dyspnea: Secondary | ICD-10-CM

## 2012-07-29 DIAGNOSIS — E871 Hypo-osmolality and hyponatremia: Secondary | ICD-10-CM

## 2012-07-29 DIAGNOSIS — E039 Hypothyroidism, unspecified: Secondary | ICD-10-CM

## 2012-07-29 DIAGNOSIS — R0989 Other specified symptoms and signs involving the circulatory and respiratory systems: Secondary | ICD-10-CM

## 2012-07-29 DIAGNOSIS — R209 Unspecified disturbances of skin sensation: Secondary | ICD-10-CM | POA: Insufficient documentation

## 2012-07-29 DIAGNOSIS — R238 Other skin changes: Secondary | ICD-10-CM

## 2012-07-29 LAB — CBC WITH DIFFERENTIAL/PLATELET
Basophils Absolute: 0 10*3/uL (ref 0.0–0.1)
Basophils Relative: 0.8 % (ref 0.0–3.0)
Eosinophils Absolute: 0.1 10*3/uL (ref 0.0–0.7)
Eosinophils Relative: 3.4 % (ref 0.0–5.0)
HCT: 36 % (ref 36.0–46.0)
Hemoglobin: 12 g/dL (ref 12.0–15.0)
Lymphocytes Relative: 13.3 % (ref 12.0–46.0)
Lymphs Abs: 0.4 10*3/uL — ABNORMAL LOW (ref 0.7–4.0)
MCHC: 33.3 g/dL (ref 30.0–36.0)
MCV: 90.6 fl (ref 78.0–100.0)
Monocytes Absolute: 0.5 10*3/uL (ref 0.1–1.0)
Monocytes Relative: 16.3 % — ABNORMAL HIGH (ref 3.0–12.0)
Neutro Abs: 2 10*3/uL (ref 1.4–7.7)
Neutrophils Relative %: 66.2 % (ref 43.0–77.0)
Platelets: 287 10*3/uL (ref 150.0–400.0)
RBC: 3.98 Mil/uL (ref 3.87–5.11)
RDW: 13.8 % (ref 11.5–14.6)
WBC: 3.1 10*3/uL — ABNORMAL LOW (ref 4.5–10.5)

## 2012-07-29 LAB — BASIC METABOLIC PANEL
BUN: 11 mg/dL (ref 6–23)
CO2: 28 mEq/L (ref 19–32)
Calcium: 9.2 mg/dL (ref 8.4–10.5)
Chloride: 100 mEq/L (ref 96–112)
Creatinine, Ser: 0.6 mg/dL (ref 0.4–1.2)
GFR: 106.33 mL/min (ref >60.00)
Glucose, Bld: 82 mg/dL (ref 70–99)
Potassium: 4.2 mEq/L (ref 3.5–5.1)
Sodium: 135 mEq/L (ref 135–145)

## 2012-07-29 LAB — TSH: TSH: 0.29 u[IU]/mL — ABNORMAL LOW (ref 0.35–5.50)

## 2012-07-29 NOTE — Patient Instructions (Addendum)
Please see patient coordinator before you leave today  to schedule arterial dopplers both legs  Please remember to go to the lab and x-ray department downstairs for your tests - we will call you with the results when they are available.     Please schedule a follow up visit in 3 months but call sooner if needed for CPX on return

## 2012-07-29 NOTE — Progress Notes (Signed)
Subjective:    Patient ID: Kayla [name], female    DOB: Aug 04, 1950   MRN: [id number]    Brief patient profile:  12  yowf never smoker with MS since Oct 96 all neuro care through [facility] neuro.  October 04, 2009 Followup with PFT's. Pt states that her breathing is no better since last seen June 2011 for acute visit. She states she is still wheezing- ventolin helps and is using this up to tid. w/u c/w anemia  Mild dysphagia no more than usual. occ waking up wheezing at night despite zegerid at hs.   rec  Try  use dulera 100 prn   06/25/2010  Ov using dulera 3x weekly with good ex tol and sleeping ok. No cough. Dysphagia no worse.    rec Call if breathing worsens despite using the dulera Work on inhaler technique       12/10/2010 Follow up/NP ov Pt states she is here to get her prolia injection.  She has hx of Osteoporosis previously receiving Prolia by GYN Kayla. Elana [name] but he has retired. ?last BMD in past . Will need to obtain records from their office.  She has no hx of No compression fx , or other fx. Not on steroids . Discussed options for Prolia and Reclast. Will notify after obtain BMD and records from GYN office.   rec Drug holiday, revisit in 02/2012   11/17/2011 f/u ov/Kayla [name] in for f/u hypothyroidism cc 2 week L calf pain, worse when walk, radiates to achilles tendon, already on naprosyn twice daily per Kayla Kayla [name] x 2 years with meals as a maint, not prn, no abd c/o's x excess flatus.  No h/o leg injury or swelling. Fully mobile and able to do aerobics as long as not bearing a lot of weight.  No assoc cramps >ven doppler neg   05/20/2012 Follow up  Referred from her neurologist for low sodium level.  Seen at neuro on 2/4 w/ routine labs noted low sodium at 127. She is asymptomatic w/ no increased fatigue, muscle weakness ,  No tremors /seizure activity, confusion  Last routine labs in 10/2011 showed NA at 132.  She is on no diuretics. No dyspnea or cough.  No new meds over last 12  months.  She denies any polydipsia , excessive thirst or polyuria.  Drinks on average 5-6 glasses of water daily .  2 cups of coffee.  Has a hx of MS followed by neurology , no recent change in rx.   07/29/2012 f/u ov/Kayla [name] re MS Chief Complaint  Patient presents with  . Follow-up    Pt c/o swelling in her feet x 1 wk. Feet are always cold and turn purple in color. No tingling or numbness.    not clear how long she's sensed her feet have been cools but onset probably sev years and insidious, not abrupt and perfectly symmetric.  Does have assoc min dep edema bilaterally.  No obvious daytime variabilty or assoc sob  or cp or chest tightness, subjective wheeze overt sinus or hb symptoms. No unusual exp hx or h/o childhood pna/ asthma or premature birth to her knowledge.   Sleeping ok without nocturnal  or early am exacerbation  of respiratory  c/o's or need for noct saba. Also denies any obvious fluctuation of symptoms with weather or environmental changes or other aggravating or alleviating factors except as outlined above   Current Medications, Allergies, Past Medical History, Past Surgical History, Family History, and Social History were reviewed in  Kayla [name] [facility] electronic medical record.  ROS  The following are not active complaints unless bolded sore throat, dysphagia, dental problems, itching, sneezing,  nasal congestion or excess/ purulent secretions, ear ache,   fever, chills, sweats, unintended wt loss, pleuritic or exertional cp, hemoptysis,  orthopnea pnd or leg swelling, presyncope, palpitations, heartburn, abdominal pain, anorexia, nausea, vomiting, diarrhea  or change in bowel or urinary habits, change in stools or urine, dysuria,hematuria,  rash, arthralgias, visual complaints, headache, numbness weakness or ataxia or problems with walking or coordination,  change in mood/affect or memory.                     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 asthma  - HFA 50 % after coaching October 04, 2009 >  50% 06/25/2010  - PFT's October 04, 2009 FEV1 2.15 (95%) with ratio 69 and DLCO 58> corrects to 98, mild insp truncation  Health Maintenance.............................................Marland KitchenWert  - Pneumovax March 28, 2009  - Td  April 10, 2009        Objective:   Physical Exam  ambulatory white female with nl vital signs Wt  118 > 127 March 28, 2009 >129 June 17 > 128 October 04, 2009 > 129 06/25/2010 >125 10/29/2010 > 128 11/26/2010 >>127 12/10/2010 > 11/17/2011  129> 126 05/20/2012 > 07/29/2012  129  HEENT: nl dentition, turbinates, and orophanx. Nl external ear canals without cough reflex  Neck without JVD/Nodes/TM  Lungs clear  RRR no s3 or murmur or increase in P2  Abd soft and benign with nl excursion in the supine position. No bruits or organomegaly / no cvat Ext warm hands, sym cool feet with nl pulses and  without calf tenderness, cyanosis clubbing or edema  Skin warm and dry without lesions Neuro: abn gait c/w MS hx      Assessment & Plan:

## 2012-07-30 ENCOUNTER — Other Ambulatory Visit: Payer: Self-pay | Admitting: Internal Medicine

## 2012-07-30 MED ORDER — LEVOTHYROXINE SODIUM 75 MCG PO TABS: 75.0000 ug | ORAL_TABLET | Freq: Every day | ORAL | Status: DC

## 2012-07-30 NOTE — Progress Notes (Signed)
Quick Note:  Spoke with pt and notified of results per Dr. [name]. Pt verbalized understanding and denied any questions.  ______ 

## 2012-08-01 NOTE — Assessment & Plan Note (Signed)
Lab Results  Component Value Date   TSH 0.29* 07/29/2012    So over -treated at present > reduce thyroid rx by 25 mcg next refill

## 2012-08-01 NOTE — Assessment & Plan Note (Signed)
-   HFA 50 % after coaching October 04, 2009 >  Better on dulera 100 > hfa  50% 06/25/2010  - PFT's October 04, 2009 FEV1 2.15 (95%) with ratio 69 and DLCO 58> corrects to 98, mild insp truncation   Adequate control on present rx, reviewed

## 2012-08-01 NOTE — Assessment & Plan Note (Signed)
She has perfectly sym pulses but indeed cool toes so arterial doppler studies ordered.

## 2012-08-03 ENCOUNTER — Encounter (INDEPENDENT_AMBULATORY_CARE_PROVIDER_SITE_OTHER): Payer: 59

## 2012-08-03 DIAGNOSIS — I739 Peripheral vascular disease, unspecified: Secondary | ICD-10-CM

## 2012-08-03 DIAGNOSIS — R209 Unspecified disturbances of skin sensation: Secondary | ICD-10-CM

## 2012-08-05 ENCOUNTER — Encounter: Payer: Self-pay | Admitting: Internal Medicine

## 2012-08-06 NOTE — Progress Notes (Signed)
Quick Note:  Spoke with pt and notified of results per Dr. [name]. Pt verbalized understanding and denied any questions.  ______ 

## 2012-08-10 ENCOUNTER — Other Ambulatory Visit: Payer: Self-pay | Admitting: *Deleted

## 2012-08-10 DIAGNOSIS — G35 Multiple sclerosis: Secondary | ICD-10-CM

## 2012-08-14 ENCOUNTER — Ambulatory Visit
Admission: RE | Admit: 2012-08-14 | Discharge: 2012-08-14 | Disposition: A | Discharge status: Home / Self Care | Payer: 59 | Source: Ambulatory Visit | Attending: *Deleted | Admitting: *Deleted

## 2012-08-14 DIAGNOSIS — G35 Multiple sclerosis: Secondary | ICD-10-CM

## 2012-08-14 MED ORDER — GADOBENATE DIMEGLUMINE 529 MG/ML IV SOLN
12.0000 mL | Freq: Once | INTRAVENOUS | Status: AC | PRN
  Administered 2012-08-14: 12 mL via INTRAVENOUS

## 2012-10-20 ENCOUNTER — Encounter: Payer: Self-pay | Admitting: Internal Medicine

## 2012-10-20 ENCOUNTER — Ambulatory Visit (INDEPENDENT_AMBULATORY_CARE_PROVIDER_SITE_OTHER): Payer: 59 | Admitting: Internal Medicine

## 2012-10-20 ENCOUNTER — Ambulatory Visit (INDEPENDENT_AMBULATORY_CARE_PROVIDER_SITE_OTHER)
Admission: RE | Admit: 2012-10-20 | Discharge: 2012-10-20 | Disposition: A | Discharge status: Home / Self Care | Payer: 59 | Source: Ambulatory Visit | Attending: Internal Medicine | Admitting: Internal Medicine

## 2012-10-20 ENCOUNTER — Other Ambulatory Visit (INDEPENDENT_AMBULATORY_CARE_PROVIDER_SITE_OTHER): Payer: 59

## 2012-10-20 VITALS — BP 118/58 | HR 85 | Temp 97.4°F | Ht 63.0 in | Wt 129.0 lb

## 2012-10-20 DIAGNOSIS — R209 Unspecified disturbances of skin sensation: Secondary | ICD-10-CM

## 2012-10-20 DIAGNOSIS — M949 Disorder of cartilage, unspecified: Secondary | ICD-10-CM

## 2012-10-20 DIAGNOSIS — R142 Eructation: Secondary | ICD-10-CM

## 2012-10-20 DIAGNOSIS — E039 Hypothyroidism, unspecified: Secondary | ICD-10-CM

## 2012-10-20 DIAGNOSIS — R0609 Other forms of dyspnea: Secondary | ICD-10-CM

## 2012-10-20 DIAGNOSIS — E871 Hypo-osmolality and hyponatremia: Secondary | ICD-10-CM

## 2012-10-20 DIAGNOSIS — M899 Disorder of bone, unspecified: Secondary | ICD-10-CM

## 2012-10-20 DIAGNOSIS — R238 Other skin changes: Secondary | ICD-10-CM

## 2012-10-20 DIAGNOSIS — R0989 Other specified symptoms and signs involving the circulatory and respiratory systems: Secondary | ICD-10-CM

## 2012-10-20 DIAGNOSIS — R143 Flatulence: Secondary | ICD-10-CM

## 2012-10-20 DIAGNOSIS — R141 Gas pain: Secondary | ICD-10-CM

## 2012-10-20 LAB — CBC WITH DIFFERENTIAL/PLATELET
Basophils Absolute: 0 10*3/uL (ref 0.0–0.1)
Basophils Relative: 1 % (ref 0.0–3.0)
Eosinophils Absolute: 0.1 10*3/uL (ref 0.0–0.7)
Eosinophils Relative: 4.7 % (ref 0.0–5.0)
HCT: 35.3 % — ABNORMAL LOW (ref 36.0–46.0)
Hemoglobin: 11.9 g/dL — ABNORMAL LOW (ref 12.0–15.0)
Lymphocytes Relative: 20.1 % (ref 12.0–46.0)
Lymphs Abs: 0.5 10*3/uL — ABNORMAL LOW (ref 0.7–4.0)
MCHC: 33.6 g/dL (ref 30.0–36.0)
MCV: 90.3 fl (ref 78.0–100.0)
Monocytes Absolute: 0.4 10*3/uL (ref 0.1–1.0)
Monocytes Relative: 18.5 % — ABNORMAL HIGH (ref 3.0–12.0)
Neutro Abs: 1.3 10*3/uL — ABNORMAL LOW (ref 1.4–7.7)
Neutrophils Relative %: 55.7 % (ref 43.0–77.0)
Platelets: 311 10*3/uL (ref 150.0–400.0)
RBC: 3.92 Mil/uL (ref 3.87–5.11)
RDW: 13.3 % (ref 11.5–14.6)
WBC: 2.4 10*3/uL — ABNORMAL LOW (ref 4.5–10.5)

## 2012-10-20 LAB — BASIC METABOLIC PANEL
BUN: 12 mg/dL (ref 6–23)
CO2: 27 mEq/L (ref 19–32)
Calcium: 9.4 mg/dL (ref 8.4–10.5)
Chloride: 98 mEq/L (ref 96–112)
Creatinine, Ser: 0.7 mg/dL (ref 0.4–1.2)
GFR: 92.16 mL/min (ref >60.00)
Glucose, Bld: 75 mg/dL (ref 70–99)
Potassium: 4.6 mEq/L (ref 3.5–5.1)
Sodium: 132 mEq/L — ABNORMAL LOW (ref 135–145)

## 2012-10-20 LAB — TSH: TSH: 0.42 u[IU]/mL (ref 0.35–5.50)

## 2012-10-20 NOTE — Progress Notes (Signed)
Subjective:    Patient ID: Kayla [name], female    DOB: [birth year]   MRN: [id number]   Brief patient profile:  79  yowf never smoker with MS since Oct 96 all neuro care through [facility] neuro.  HPI October 04, 2009 Followup with PFT's. Pt states that her breathing is no better since last seen June 2011 for acute visit. She states she is still wheezing- ventolin helps and is using this up to tid. w/u c/w anemia  Mild dysphagia no more than usual. occ waking up wheezing at night despite zegerid at hs.   rec  Try  use dulera 100 prn   06/25/2010  Ov using dulera 3x weekly with good ex tol and sleeping ok. No cough. Dysphagia no worse.    rec Call if breathing worsens despite using the dulera Work on inhaler technique       12/10/2010 Follow up/NP ov Pt states she is here to get her prolia injection.  She has hx of Osteoporosis previously receiving Prolia by GYN Dr. Elana Alm but he has retired. ?last BMD in past . Will need to obtain records from their office.  She has no hx of No compression fx , or other fx. Not on steroids . Discussed options for Prolia and Reclast. Will notify after obtain BMD and records from GYN office.   rec Drug holiday, revisit in 02/2012   11/17/2011 f/u ov/Kayla [name] in for f/u hypothyroidism cc 2 week L calf pain, worse when walk, radiates to achilles tendon, already on naprosyn twice daily per [name] x 2 years with meals as a maint, not prn, no abd c/o's x excess flatus.  No h/o leg injury or swelling. Fully mobile and able to do aerobics as long as not bearing a lot of weight.  No assoc cramps >ven doppler neg   05/20/2012 Follow up  Referred from her neurologist for low sodium level.  Seen at neuro on 2/4 w/ routine labs noted low sodium at 127. She is asymptomatic w/ no increased fatigue, muscle weakness ,  No tremors /seizure activity, confusion  Last routine labs in 10/2011 showed NA at 132.  She is on no diuretics. No dyspnea or cough.  No new meds over last 12  months.  She denies any polydipsia , excessive thirst or polyuria.  Drinks on average 5-6 glasses of water daily .  2 cups of coffee.  Has a hx of MS followed by neurology , no recent change in rx.   07/29/2012 f/u ov/Kayla [name] re MS Chief Complaint  Patient presents with  . Follow-up    Pt c/o swelling in her feet x 1 wk. Feet are always cold and turn purple in color. No tingling or numbness.    not clear how long she's sensed her feet have been cools but onset probably sev years and insidious, not abrupt and perfectly symmetric.  Does have assoc min dep edema bilaterally. rec Please see patient coordinator before you leave today  to schedule arterial dopplers both legs> nl  10/20/2012 f/u ov/Kayla [name] re multiple chronic problems Chief Complaint  Patient presents with  . Annual Exam    Not fasting- Pt c/o increased flatulence. She denies feeling bloated or having any change in bowels or abd pain.    no change in diet at all or meds, no change in leg symptoms. Not limited by breathing MS no change walking or swallowing.   No obvious daytime variabilty or assoc cough or chest tightness, subjective wheeze overt  sinus or hb symptoms. No unusual exp hx or h/o childhood pna/ asthma or premature birth to her knowledge.   Sleeping ok without nocturnal  or early am exacerbation  of respiratory  c/o's or need for noct saba. Also denies any obvious fluctuation of symptoms with weather or environmental changes or other aggravating or alleviating factors except as outlined above   Current Medications, Allergies, Past Medical History, Past Surgical History, Family History, and Social History were reviewed in [employer] record.  ROS  The following are not active complaints unless bolded sore throat, dysphagia, dental problems, itching, sneezing,  nasal congestion or excess/ purulent secretions, ear ache,   fever, chills, sweats, unintended wt loss, pleuritic or exertional cp, hemoptysis,   orthopnea pnd or leg swelling, presyncope, palpitations, heartburn, abdominal pain, anorexia, nausea, vomiting, diarrhea  or change in bowel or urinary habits, change in stools or urine, dysuria,hematuria,  rash, arthralgias, visual complaints, headache, numbness weakness or ataxia or problems with walking or coordination,  change in mood/affect or memory.                     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 asthma  - HFA 50 % after coaching October 04, 2009 >  50% 06/25/2010  - PFT's October 04, 2009 FEV1 2.15 (95%) with ratio 69 and DLCO 58> corrects to 98, mild insp truncation  Health Maintenance.............................................Marland KitchenWert  - Pneumovax March 28, 2009  - Td  April 10, 2009        Objective:   Physical Exam  ambulatory white female with nl vital signs Wt  118 > 127 March 28, 2009 >129 June 17 > 128 October 04, 2009 > 129 06/25/2010 >125 10/29/2010 > 128 11/26/2010 >>127 12/10/2010 > 11/17/2011  129> 126 05/20/2012 > 07/29/2012  129 > 10/20/2012  129  HEENT: nl dentition, turbinates, and orophanx. Nl external ear canals without cough reflex  Neck without JVD/Nodes/TM  Lungs clear  RRR no s3 or murmur or increase in P2  Abd soft and benign with nl excursion in the supine position. No bruits or organomegaly / no cvat Ext warm hands, sym cool feet with nl pulses and  without calf tenderness, cyanosis clubbing or edema  Skin warm and dry without lesions Neuro: abn gait c/w MS hx      CXR  10/20/2012 :  No acute cardiopulmonary disease  identified.   Assessment & Plan:

## 2012-10-20 NOTE — Patient Instructions (Addendum)
Please remember to go to the lab and x-ray department downstairs for your tests - we will call you with the results when they are available.     Treatment consists of avoiding foods that cause gas (especially beans and raw vegetables like spinach and salads)  and citrucel 1 heaping tsp twice daily with a large glass of water.  Pain should improve w/in 2 weeks and if not then consider further GI work up if needed   Please schedule a follow up visit in 6 months but call sooner if needed

## 2012-10-21 ENCOUNTER — Telehealth: Payer: Self-pay | Admitting: Internal Medicine

## 2012-10-21 DIAGNOSIS — R143 Flatulence: Secondary | ICD-10-CM | POA: Insufficient documentation

## 2012-10-21 LAB — VITAMIN D 25 HYDROXY (VIT D DEFICIENCY, FRACTURES): Vit D, 25-Hydroxy: 47 ng/mL (ref 30–89)

## 2012-10-21 NOTE — Assessment & Plan Note (Signed)
Diet reviewed, rec trial of beano prn vs citrucel daily > f/u gi prn

## 2012-10-21 NOTE — Telephone Encounter (Signed)
Kayla [name] was calling to inform pt of cxr and lab results:  Notes Recorded by [name], MD on 10/20/2012 at 8:57 PM Call pt: Reviewed cxr and no acute change so no change in recommendations made at [facility]      Notes Recorded by [name], MD on 10/21/2012 at 1:46 PM Call patient : Study is unremarkable, no change in recs   -------  [id number]

## 2012-10-21 NOTE — Assessment & Plan Note (Signed)
-   HFA 50 % after coaching October 04, 2009 >  Better on dulera 100 > hfa  50% 06/25/2010  - PFT's October 04, 2009 FEV1 2.15 (95%) with ratio 69 and DLCO 58> corrects to 98, mild insp truncation   All goals of chronic asthma control met including optimal function and elimination of symptoms with minimal need for rescue therapy.  Contingencies discussed in full including contacting this office immediately if not controlling the symptoms using the rule of two's.

## 2012-10-21 NOTE — Progress Notes (Signed)
Quick Note:  Spoke with pt. Informed her of lab results and recs per Dr. [name]. She verbalized understanding and voiced no further questions or concerns at this time. ______ 

## 2012-10-21 NOTE — Assessment & Plan Note (Signed)
10/2011 132 >127 05/17/12 >130 05/20/2012 >fluid restrictions> na 132 10/20/12   Adequate control on present rx, reviewed

## 2012-10-21 NOTE — Assessment & Plan Note (Signed)
Lab Results  Component Value Date   TSH 0.42 10/20/2012     Adequate control on present rx, reviewed

## 2012-10-21 NOTE — Assessment & Plan Note (Signed)
Vit d levels adequate.     Needs BMD  02/2013

## 2012-10-21 NOTE — Progress Notes (Signed)
Quick Note:  Spoke with pt. Informed her of lab results and recs per Dr. Sherene Sires. She verbalized understanding and voiced no further questions or concerns at this time. ______

## 2012-10-21 NOTE — Assessment & Plan Note (Signed)
-   Arterial dopplers 08/03/12 > nl bilaterally with nl abi's also  No change on exam or symptomatically > no further w/u

## 2012-10-21 NOTE — Progress Notes (Signed)
Quick Note:  Spoke with pt. Informed her of cxr results and recs per Dr. Sherene Sires. She verbalized understanding and voiced no further questions or concerns at this time. ______

## 2012-10-21 NOTE — Telephone Encounter (Signed)
Spoke with pt. Informed her of lab and cxr results and recs per Dr. Sherene Sires.  She verbalized understanding and voiced no further questions or concerns at this time.

## 2012-11-03 ENCOUNTER — Ambulatory Visit: Payer: Self-pay | Admitting: Hematology and Oncology

## 2012-11-17 ENCOUNTER — Ambulatory Visit: Payer: Self-pay | Admitting: Oncology

## 2012-11-22 ENCOUNTER — Ambulatory Visit: Payer: Self-pay | Admitting: Oncology

## 2012-11-22 ENCOUNTER — Ambulatory Visit: Payer: Self-pay | Admitting: Hematology and Oncology

## 2012-12-06 ENCOUNTER — Other Ambulatory Visit: Payer: Self-pay | Admitting: Dermatology

## 2012-12-22 ENCOUNTER — Ambulatory Visit: Payer: Self-pay | Admitting: Oncology

## 2013-01-22 ENCOUNTER — Ambulatory Visit: Payer: Self-pay | Admitting: Oncology

## 2013-02-21 ENCOUNTER — Ambulatory Visit: Payer: Self-pay | Admitting: Oncology

## 2013-02-23 ENCOUNTER — Emergency Department (HOSPITAL_COMMUNITY): Payer: 59

## 2013-02-23 ENCOUNTER — Encounter (HOSPITAL_COMMUNITY): Payer: Self-pay | Admitting: Emergency Medicine

## 2013-02-23 ENCOUNTER — Inpatient Hospital Stay (HOSPITAL_COMMUNITY)
Admission: EM | Admit: 2013-02-23 | Discharge: 2013-03-08 | DRG: 469 | Disposition: A | Discharge status: Rehabilitation Facility | Payer: 59 | Attending: Pulmonary Disease | Admitting: Pulmonary Disease

## 2013-02-23 DIAGNOSIS — R0609 Other forms of dyspnea: Secondary | ICD-10-CM

## 2013-02-23 DIAGNOSIS — E46 Unspecified protein-calorie malnutrition: Secondary | ICD-10-CM | POA: Diagnosis present

## 2013-02-23 DIAGNOSIS — Z8541 Personal history of malignant neoplasm of cervix uteri: Secondary | ICD-10-CM

## 2013-02-23 DIAGNOSIS — J3800 Paralysis of vocal cords and larynx, unspecified: Secondary | ICD-10-CM

## 2013-02-23 DIAGNOSIS — I959 Hypotension, unspecified: Secondary | ICD-10-CM | POA: Diagnosis not present

## 2013-02-23 DIAGNOSIS — R209 Unspecified disturbances of skin sensation: Secondary | ICD-10-CM

## 2013-02-23 DIAGNOSIS — E871 Hypo-osmolality and hyponatremia: Secondary | ICD-10-CM

## 2013-02-23 DIAGNOSIS — J9621 Acute and chronic respiratory failure with hypoxia: Secondary | ICD-10-CM

## 2013-02-23 DIAGNOSIS — D62 Acute posthemorrhagic anemia: Secondary | ICD-10-CM | POA: Diagnosis not present

## 2013-02-23 DIAGNOSIS — K5900 Constipation, unspecified: Secondary | ICD-10-CM | POA: Diagnosis not present

## 2013-02-23 DIAGNOSIS — R143 Flatulence: Secondary | ICD-10-CM

## 2013-02-23 DIAGNOSIS — M549 Dorsalgia, unspecified: Secondary | ICD-10-CM

## 2013-02-23 DIAGNOSIS — T17308A Unspecified foreign body in larynx causing other injury, initial encounter: Secondary | ICD-10-CM | POA: Diagnosis not present

## 2013-02-23 DIAGNOSIS — K219 Gastro-esophageal reflux disease without esophagitis: Secondary | ICD-10-CM | POA: Diagnosis present

## 2013-02-23 DIAGNOSIS — S72001R Fracture of unspecified part of neck of right femur, subsequent encounter for open fracture type IIIA, IIIB, or IIIC with malunion: Secondary | ICD-10-CM

## 2013-02-23 DIAGNOSIS — J449 Chronic obstructive pulmonary disease, unspecified: Secondary | ICD-10-CM | POA: Diagnosis present

## 2013-02-23 DIAGNOSIS — Z79899 Other long term (current) drug therapy: Secondary | ICD-10-CM

## 2013-02-23 DIAGNOSIS — J383 Other diseases of vocal cords: Secondary | ICD-10-CM

## 2013-02-23 DIAGNOSIS — S72001A Fracture of unspecified part of neck of right femur, initial encounter for closed fracture: Secondary | ICD-10-CM

## 2013-02-23 DIAGNOSIS — M216X9 Other acquired deformities of unspecified foot: Secondary | ICD-10-CM | POA: Diagnosis present

## 2013-02-23 DIAGNOSIS — I5031 Acute diastolic (congestive) heart failure: Secondary | ICD-10-CM

## 2013-02-23 DIAGNOSIS — R05 Cough: Secondary | ICD-10-CM

## 2013-02-23 DIAGNOSIS — W010XXA Fall on same level from slipping, tripping and stumbling without subsequent striking against object, initial encounter: Secondary | ICD-10-CM | POA: Diagnosis present

## 2013-02-23 DIAGNOSIS — S72001S Fracture of unspecified part of neck of right femur, sequela: Secondary | ICD-10-CM

## 2013-02-23 DIAGNOSIS — S72009A Fracture of unspecified part of neck of unspecified femur, initial encounter for closed fracture: Secondary | ICD-10-CM

## 2013-02-23 DIAGNOSIS — Z800 Family history of malignant neoplasm of digestive organs: Secondary | ICD-10-CM

## 2013-02-23 DIAGNOSIS — B965 Pseudomonas (aeruginosa) (mallei) (pseudomallei) as the cause of diseases classified elsewhere: Secondary | ICD-10-CM | POA: Diagnosis present

## 2013-02-23 DIAGNOSIS — E872 Acidosis, unspecified: Secondary | ICD-10-CM | POA: Diagnosis present

## 2013-02-23 DIAGNOSIS — J4489 Other specified chronic obstructive pulmonary disease: Secondary | ICD-10-CM | POA: Diagnosis present

## 2013-02-23 DIAGNOSIS — E876 Hypokalemia: Secondary | ICD-10-CM | POA: Diagnosis not present

## 2013-02-23 DIAGNOSIS — D638 Anemia in other chronic diseases classified elsewhere: Secondary | ICD-10-CM | POA: Diagnosis present

## 2013-02-23 DIAGNOSIS — R059 Cough, unspecified: Secondary | ICD-10-CM

## 2013-02-23 DIAGNOSIS — M899 Disorder of bone, unspecified: Secondary | ICD-10-CM

## 2013-02-23 DIAGNOSIS — M810 Age-related osteoporosis without current pathological fracture: Secondary | ICD-10-CM | POA: Diagnosis present

## 2013-02-23 DIAGNOSIS — E039 Hypothyroidism, unspecified: Secondary | ICD-10-CM | POA: Diagnosis present

## 2013-02-23 DIAGNOSIS — R339 Retention of urine, unspecified: Secondary | ICD-10-CM | POA: Diagnosis not present

## 2013-02-23 DIAGNOSIS — E038 Other specified hypothyroidism: Secondary | ICD-10-CM | POA: Diagnosis present

## 2013-02-23 DIAGNOSIS — G4733 Obstructive sleep apnea (adult) (pediatric): Secondary | ICD-10-CM | POA: Diagnosis present

## 2013-02-23 DIAGNOSIS — S72002D Fracture of unspecified part of neck of left femur, subsequent encounter for closed fracture with routine healing: Secondary | ICD-10-CM

## 2013-02-23 DIAGNOSIS — N390 Urinary tract infection, site not specified: Secondary | ICD-10-CM | POA: Diagnosis present

## 2013-02-23 DIAGNOSIS — R0989 Other specified symptoms and signs involving the circulatory and respiratory systems: Secondary | ICD-10-CM

## 2013-02-23 DIAGNOSIS — R404 Transient alteration of awareness: Secondary | ICD-10-CM | POA: Diagnosis not present

## 2013-02-23 DIAGNOSIS — I2789 Other specified pulmonary heart diseases: Secondary | ICD-10-CM | POA: Diagnosis present

## 2013-02-23 DIAGNOSIS — J189 Pneumonia, unspecified organism: Secondary | ICD-10-CM

## 2013-02-23 DIAGNOSIS — G35 Multiple sclerosis: Secondary | ICD-10-CM | POA: Diagnosis present

## 2013-02-23 DIAGNOSIS — IMO0002 Reserved for concepts with insufficient information to code with codable children: Secondary | ICD-10-CM | POA: Diagnosis not present

## 2013-02-23 DIAGNOSIS — R52 Pain, unspecified: Secondary | ICD-10-CM

## 2013-02-23 DIAGNOSIS — J9601 Acute respiratory failure with hypoxia: Secondary | ICD-10-CM

## 2013-02-23 DIAGNOSIS — J9600 Acute respiratory failure, unspecified whether with hypoxia or hypercapnia: Secondary | ICD-10-CM | POA: Diagnosis not present

## 2013-02-23 DIAGNOSIS — R7309 Other abnormal glucose: Secondary | ICD-10-CM | POA: Diagnosis not present

## 2013-02-23 DIAGNOSIS — S72033A Displaced midcervical fracture of unspecified femur, initial encounter for closed fracture: Principal | ICD-10-CM | POA: Diagnosis present

## 2013-02-23 DIAGNOSIS — J690 Pneumonitis due to inhalation of food and vomit: Secondary | ICD-10-CM | POA: Diagnosis present

## 2013-02-23 LAB — CBC WITH DIFFERENTIAL/PLATELET
Basophils Absolute: 0 10*3/uL (ref 0.0–0.1)
Basophils Relative: 0 % (ref 0–1)
Eosinophils Absolute: 0.3 10*3/uL (ref 0.0–0.7)
Eosinophils Relative: 3 % (ref 0–5)
HCT: 33.2 % — ABNORMAL LOW (ref 36.0–46.0)
Hemoglobin: 11.3 g/dL — ABNORMAL LOW (ref 12.0–15.0)
Lymphocytes Relative: 23 % (ref 12–46)
Lymphs Abs: 1.8 10*3/uL (ref 0.7–4.0)
MCH: 29.4 pg (ref 26.0–34.0)
MCHC: 34 g/dL (ref 30.0–36.0)
MCV: 86.2 fL (ref 78.0–100.0)
Monocytes Absolute: 0.5 10*3/uL (ref 0.1–1.0)
Monocytes Relative: 6 % (ref 3–12)
Neutro Abs: 5.3 10*3/uL (ref 1.7–7.7)
Neutrophils Relative %: 67 % (ref 43–77)
Platelets: 287 10*3/uL (ref 150–400)
RBC: 3.85 MIL/uL — ABNORMAL LOW (ref 3.87–5.11)
RDW: 13.2 % (ref 11.5–15.5)
WBC: 7.9 10*3/uL (ref 4.0–10.5)

## 2013-02-23 LAB — URINALYSIS, ROUTINE W REFLEX MICROSCOPIC
Bilirubin Urine: NEGATIVE
Glucose, UA: NEGATIVE mg/dL
Hgb urine dipstick: NEGATIVE
Ketones, ur: NEGATIVE mg/dL
Nitrite: NEGATIVE
Protein, ur: NEGATIVE mg/dL
Specific Gravity, Urine: 1.013 (ref 1.005–1.030)
Urobilinogen, UA: 0.2 mg/dL (ref 0.0–1.0)
pH: 6.5 (ref 5.0–8.0)

## 2013-02-23 LAB — BASIC METABOLIC PANEL WITH GFR
BUN: 16 mg/dL (ref 6–23)
CO2: 25 meq/L (ref 19–32)
Calcium: 9.6 mg/dL (ref 8.4–10.5)
Chloride: 97 meq/L (ref 96–112)
Creatinine, Ser: 0.7 mg/dL (ref 0.50–1.10)
GFR calc Af Amer: >90 mL/min
GFR calc non Af Amer: >90 mL/min
Glucose, Bld: 101 mg/dL — ABNORMAL HIGH (ref 70–99)
Potassium: 4.5 meq/L (ref 3.5–5.1)
Sodium: 132 meq/L — ABNORMAL LOW (ref 135–145)

## 2013-02-23 LAB — URINE MICROSCOPIC-ADD ON

## 2013-02-23 MED ORDER — SODIUM CHLORIDE 0.9 % IV BOLUS (SEPSIS)
500.0000 mL | Freq: Once | INTRAVENOUS | Status: AC
  Administered 2013-02-23: 500 mL via INTRAVENOUS

## 2013-02-23 MED ORDER — DALFAMPRIDINE ER 10 MG PO TB12
10.0000 mg | ORAL_TABLET | Freq: Two times a day (BID) | ORAL | Status: DC
  Administered 2013-02-24 – 2013-03-08 (×19): 10 mg via ORAL

## 2013-02-23 MED ORDER — ACETAMINOPHEN 325 MG PO TABS
650.0000 mg | ORAL_TABLET | Freq: Four times a day (QID) | ORAL | Status: DC | PRN
  Administered 2013-02-24 – 2013-03-08 (×3): 650 mg via ORAL
  Filled 2013-02-23 (×3): qty 2

## 2013-02-23 MED ORDER — TEMAZEPAM 15 MG PO CAPS
15.0000 mg | ORAL_CAPSULE | Freq: Every day | ORAL | Status: DC | PRN
  Administered 2013-03-02 – 2013-03-06 (×4): 15 mg via ORAL
  Filled 2013-02-23 (×4): qty 1

## 2013-02-23 MED ORDER — ONDANSETRON HCL 4 MG/2ML IJ SOLN
4.0000 mg | Freq: Once | INTRAMUSCULAR | Status: AC
  Administered 2013-02-23: 4 mg via INTRAVENOUS
  Filled 2013-02-23: qty 2

## 2013-02-23 MED ORDER — LEVOTHYROXINE SODIUM 75 MCG PO TABS
75.0000 ug | ORAL_TABLET | Freq: Every day | ORAL | Status: DC
  Administered 2013-02-24 – 2013-03-08 (×10): 75 ug via ORAL
  Filled 2013-02-23 (×16): qty 1

## 2013-02-23 MED ORDER — ONDANSETRON HCL 4 MG/2ML IJ SOLN
4.0000 mg | Freq: Four times a day (QID) | INTRAMUSCULAR | Status: DC | PRN
Start: 2013-02-23 — End: 2013-02-25

## 2013-02-23 MED ORDER — BACLOFEN 20 MG PO TABS
20.0000 mg | ORAL_TABLET | Freq: Four times a day (QID) | ORAL | Status: DC | PRN
  Administered 2013-02-24 – 2013-03-03 (×2): 20 mg via ORAL
  Filled 2013-02-23 (×3): qty 1.5

## 2013-02-23 MED ORDER — GABAPENTIN 300 MG PO CAPS
600.0000 mg | ORAL_CAPSULE | Freq: Two times a day (BID) | ORAL | Status: DC
  Administered 2013-02-24 – 2013-03-08 (×19): 600 mg via ORAL
  Filled 2013-02-23 (×26): qty 2

## 2013-02-23 MED ORDER — FLUOXETINE HCL 20 MG PO CAPS
40.0000 mg | ORAL_CAPSULE | Freq: Every day | ORAL | Status: DC
  Administered 2013-02-24 – 2013-03-08 (×10): 40 mg via ORAL
  Filled 2013-02-23 (×13): qty 2

## 2013-02-23 MED ORDER — FENTANYL CITRATE 0.05 MG/ML IJ SOLN
50.0000 ug | Freq: Once | INTRAMUSCULAR | Status: AC
  Administered 2013-02-23: 50 ug via INTRAVENOUS
  Filled 2013-02-23: qty 2

## 2013-02-23 MED ORDER — MORPHINE SULFATE 4 MG/ML IJ SOLN
4.0000 mg | Freq: Once | INTRAMUSCULAR | Status: AC
  Administered 2013-02-23: 4 mg via INTRAVENOUS
  Filled 2013-02-23: qty 1

## 2013-02-23 MED ORDER — SODIUM CHLORIDE 0.9 % IV SOLN
INTRAVENOUS | Status: DC
  Administered 2013-02-24 – 2013-02-26 (×3): via INTRAVENOUS

## 2013-02-23 MED ORDER — FESOTERODINE FUMARATE ER 4 MG PO TB24
4.0000 mg | ORAL_TABLET | Freq: Every day | ORAL | Status: DC
  Administered 2013-02-24 – 2013-03-08 (×10): 4 mg via ORAL
  Filled 2013-02-23 (×14): qty 1

## 2013-02-23 MED ORDER — MORPHINE SULFATE 4 MG/ML IJ SOLN
4.0000 mg | INTRAMUSCULAR | Status: DC | PRN
  Administered 2013-02-23 – 2013-02-24 (×5): 4 mg via INTRAVENOUS
  Filled 2013-02-23 (×5): qty 1

## 2013-02-23 MED ORDER — PANTOPRAZOLE SODIUM 40 MG PO TBEC
40.0000 mg | DELAYED_RELEASE_TABLET | Freq: Every day | ORAL | Status: DC
  Administered 2013-02-24 – 2013-02-25 (×2): 40 mg via ORAL
  Filled 2013-02-23 (×3): qty 1

## 2013-02-23 MED ORDER — SODIUM CHLORIDE 0.9 % IV SOLN
Freq: Once | INTRAVENOUS | Status: AC
  Administered 2013-02-23: 22:00:00 via INTRAVENOUS

## 2013-02-23 NOTE — ED Notes (Signed)
Bed: NW29 Expected date:  Expected time:  Means of arrival:  Comments: Ems/ 63 fall hip pain

## 2013-02-23 NOTE — ED Provider Notes (Signed)
CSN: [id number]     Arrival date & time 02/23/13  2001 History   First MD Initiated Contact with Patient 02/23/13 2008     Chief Complaint  Patient presents with  . Hip Pain  . Fall   (Consider location/radiation/quality/duration/timing/severity/associated sxs/prior Treatment) HPI... accidental mechanical fall at approximately 6:30 PM.  No head or neck trauma. Complains of sharp right lateral hip pain. No other bony tenderness. Patient is alert and interactive. Palpation positioning makes pain worse. Severity is moderate.  Past Medical History  Diagnosis Date  . Multiple sclerosis   . Unspecified hypothyroidism   . Other voice and resonance disorders   . Other diseases of vocal cords   . Cervical cancer 2005   Past Surgical History  Procedure Laterality Date  . Vesicovaginal fistula closure w/ tah     Family History  Problem Relation Age of Onset  . Colon cancer Father    History  Substance Use Topics  . Smoking status: Never Smoker   . Smokeless tobacco: Never Used  . Alcohol Use: Yes     Comment: rare   OB History   Grav Para Term Preterm Abortions TAB SAB Ect Mult Living                 Review of Systems  All other systems reviewed and are negative.    Allergies  Oxycodone-acetaminophen; Oxycodone-acetaminophen; Percocet; Phenytoin; Zanaflex; and Phenytoin sodium extended  Home Medications   Current Outpatient Rx  Name  Route  Sig  Dispense  Refill  . AMPYRA 10 MG TB12   Oral   Take 1 tablet by mouth 2 (two) times daily.         . baclofen (LIORESAL) 20 MG tablet   Oral   Take 20 mg by mouth. 1 1/2 tabs four times a day          . dexlansoprazole (DEXILANT) 60 MG capsule   Oral   Take 60 mg by mouth daily before breakfast.         . fesoterodine (TOVIAZ) 4 MG TB24 tablet   Oral   Take 4 mg by mouth daily.         Marland Kitchen FLUoxetine (PROZAC) 40 MG capsule   Oral   Take 40 mg by mouth daily.           Marland Kitchen gabapentin (NEURONTIN) 600 MG tablet   Oral   Take 600 mg by mouth 2 (two) times daily.         Marland Kitchen levothyroxine (SYNTHROID) 75 MCG tablet   Oral   Take 1 tablet (75 mcg total) by mouth daily.   30 tablet   11   . naproxen (NAPROSYN) 500 MG tablet   Oral   Take 500 mg by mouth 2 (two) times daily with a meal.           . PRESCRIPTION MEDICATION      1 each every 30 (thirty) days. tysabri infusion taken at [facility] on 02/16/2013         . temazepam (RESTORIL) 15 MG capsule   Oral   Take 15 mg by mouth daily as needed.            BP 121/59  Pulse 72  Temp(Src) 98.7 F (37.1 C) (Oral)  Resp 14  Ht 5\' 3"  (1.6 m)  Wt 129 lb (58.514 kg)  BMI 22.86 kg/m2  SpO2 94% Physical Exam  Nursing note and vitals reviewed. Constitutional: She is  oriented to person, place, and time. She appears well-developed and well-nourished.  HENT:  Head: Normocephalic and atraumatic.  Eyes: Conjunctivae and EOM are normal. Pupils are equal, round, and reactive to light.  Neck: Normal range of motion. Neck supple.  Cardiovascular: Normal rate, regular rhythm and normal heart sounds.   Pulmonary/Chest: Effort normal and breath sounds normal.  Abdominal: Soft. Bowel sounds are normal.  Musculoskeletal:  Tender right lateral hip  Neurological: She is alert and oriented to person, place, and time.  Skin: Skin is warm and dry.  Psychiatric: She has a normal mood and affect. Her behavior is normal.    ED Course  Procedures (including critical care time) Labs Review Labs Reviewed  BASIC METABOLIC PANEL - Abnormal; Notable for the following:    Sodium 132 (*)    Glucose, Bld 101 (*)    All other components within normal limits  CBC WITH DIFFERENTIAL - Abnormal; Notable for the following:    RBC 3.85 (*)    Hemoglobin 11.3 (*)    HCT 33.2 (*)    All other components within normal limits  URINALYSIS, ROUTINE W REFLEX MICROSCOPIC - Abnormal; Notable for the following:    Leukocytes, UA TRACE (*)    All  other components within normal limits  URINE MICROSCOPIC-ADD ON - Abnormal; Notable for the following:    Bacteria, UA MANY (*)    All other components within normal limits  URINE CULTURE   Imaging Review Dg Hip Complete Right  02/23/2013   CLINICAL DATA:  Right hip pain post fall.  EXAM: RIGHT HIP - COMPLETE 2+ VIEW  COMPARISON:  CT 11/17/2008  FINDINGS: Exam demonstrates mild symmetric degenerative change of the hips. There is a minimally displaced subcapital right femoral neck fracture. Cannot exclude a subtle nondisplaced fracture along the left side of the symphysis pubis joint. There multiple surgical clips over the pelvic sidewalls. There are multiple soft tissue calcifications.  IMPRESSION: Displaced right subcapital femoral neck fracture. Cannot exclude a nondisplaced fracture along the left side of the symphysis pubis joint.   Electronically Signed   By: Elberta Fortis M.D.   On: 02/23/2013 21:55   Dg Femur Right  02/23/2013   CLINICAL DATA:  Fall with right hip pain.  EXAM: RIGHT FEMUR - 2 VIEW  COMPARISON:  11/17/2008 CT  FINDINGS: There is a probable femoral neck fracture with shortening, but overlying gluteal calcification obscures detail.  There is no evidence of subluxation or dislocation.  No focal bony lesions are present.  IMPRESSION: Probable femoral neck fracture with shortening. Consider dedicated imaging of the hip secondary to overlying gluteal calcifications.   Electronically Signed   By: Laveda Abbe M.D.   On: 02/23/2013 21:13    EKG Interpretation   None       MDM   1. Fractured femoral neck, right, closed, initial encounter   2. Multiple sclerosis    X-rays of right femur reveal a closed femoral neck fracture slightly displaced. Discussed with Dr. Victorino Dike and Dr. Elisabeth Pigeon and.   Admit    Donnetta Hutching, MD 02/23/13 2251

## 2013-02-23 NOTE — ED Notes (Signed)
Per EMS: Pt was walking down hall, has MS, called MD today because felt like legs heavier and tingling more than normal. Pt fell on R hip. R leg shorter and rotated out. No LOC, did not hit head. No neck or back pain. Pt given 50 Fentanyl IM with no relief en route.

## 2013-02-23 NOTE — H&P (Signed)
[facility] History and Physical  [name]:[id number] DOB: April 11, 1950 DOA: 02/23/2013  Referring physician: EDP PCP: Sandrea Hughs, MD  Specialists: Neurology Dr.Jeffrey Riley Lam  Chief Complaint: fall, hip pain  HPI: Kayla [name] is a 62 y.o. female with PMH of relapsing MS followed by Dr.DOuglas, with foot drop, Hypothyrodiism, osteoporosis and was walking today and tripped and had a mechanical fall due to a rug, subsequently started having R hip pain and was unable to ambulate, subsequently EMS was called and brought to [facility] ER. She was found to have a  Displaced right subcapital femoral neck fracture, EDP d/w Ortho Dr.Hewitt and [facility] was consulted for further evaluation and management.  Review of Systems: The patient denies anorexia, fever, weight loss,, vision loss, decreased hearing, hoarseness, chest pain, syncope, dyspnea on exertion, peripheral edema, balance deficits, hemoptysis, abdominal pain, melena, hematochezia, severe indigestion/heartburn, hematuria, incontinence, genital sores, muscle weakness, suspicious skin lesions, transient blindness, difficulty walking, depression, unusual weight change, abnormal bleeding, enlarged lymph nodes, angioedema, and breast masses.    Past Medical History  Diagnosis Date  . Multiple sclerosis   . Unspecified hypothyroidism   . Other voice and resonance disorders   . Other diseases of vocal cords   . Cervical cancer 2005   Past Surgical History  Procedure Laterality Date  . Vesicovaginal fistula closure w/ tah     Social History:  reports that she has never smoked. She has never used smokeless tobacco. She reports that she drinks alcohol. She reports that she does not use illicit drugs. Lives at home with husband, works part-time at [employer] when outside home  Allergies  Allergen Reactions  . Oxycodone-Acetaminophen     REACTION: unspecified  . Oxycodone-Acetaminophen     Rash head to toe  . Percocet  [Oxycodone-Acetaminophen]     hallucinations  . Phenytoin     REACTION: rash  . Zanaflex [Tizanidine Hydrochloride]   . Phenytoin Sodium Extended Rash    Family History  Problem Relation Age of Onset  . Colon cancer Father     Prior to Admission medications   Medication Sig Start Date End Date Taking? Authorizing Provider  AMPYRA 10 MG TB12 Take 1 tablet by mouth 2 (two) times daily. 06/24/10  Yes Historical Provider, MD  baclofen (LIORESAL) 20 MG tablet Take 20 mg by mouth. 1 1/2 tabs four times a day    Yes Historical Provider, MD  dexlansoprazole (DEXILANT) 60 MG capsule Take 60 mg by mouth daily before breakfast.   Yes Historical Provider, MD  fesoterodine (TOVIAZ) 4 MG TB24 tablet Take 4 mg by mouth daily.   Yes Historical Provider, MD  FLUoxetine (PROZAC) 40 MG capsule Take 40 mg by mouth daily.     Yes Historical Provider, MD  gabapentin (NEURONTIN) 600 MG tablet Take 600 mg by mouth 2 (two) times daily.   Yes Historical Provider, MD  levothyroxine (SYNTHROID) 75 MCG tablet Take 1 tablet (75 mcg total) by mouth daily. 07/30/12 07/30/13 Yes [name], MD  naproxen (NAPROSYN) 500 MG tablet Take 500 mg by mouth 2 (two) times daily with a meal.     Yes Historical Provider, MD  PRESCRIPTION MEDICATION 1 each every 30 (thirty) days. tysabri infusion taken at [facility] on 02/16/2013   Yes Historical Provider, MD  temazepam (RESTORIL) 15 MG capsule Take 15 mg by mouth daily as needed.     Yes Historical Provider, MD   Physical Exam: Filed Vitals:  02/23/13 2002  BP: 121/59  Pulse: 72  Temp: 98.7 F (37.1 C)  Resp: 14     General:  AAOx3, uncomfortable appearing  HEENt: pERRLA, EOMI  Cardiovascular: S1S2/RRR  Respiratory: CTAB  Abdomen:soft, Nt, ND, and BS present  Skin:no rashes  Musculoskeletal: R LE is shorter and externally rotated, no edema, distal pulses, DP intact  Psychiatric: appropriate mood and affect  Labs on Admission:  Basic  Metabolic Panel:  Recent Labs Lab 02/23/13 2045  NA 132*  K 4.5  CL 97  CO2 25  GLUCOSE 101*  BUN 16  CREATININE 0.70  CALCIUM 9.6   Liver Function Tests: No results found for this basename: AST, ALT, ALKPHOS, BILITOT, PROT, ALBUMIN,  in the last 168 hours No results found for this basename: LIPASE, AMYLASE,  in the last 168 hours No results found for this basename: AMMONIA,  in the last 168 hours CBC:  Recent Labs Lab 02/23/13 2045  WBC 7.9  NEUTROABS 5.3  HGB 11.3*  HCT 33.2*  MCV 86.2  PLT 287   Cardiac Enzymes: No results found for this basename: CKTOTAL, CKMB, CKMBINDEX, TROPONINI,  in the last 168 hours  BNP (last 3 results) No results found for this basename: PROBNP,  in the last 8760 hours CBG: No results found for this basename: GLUCAP,  in the last 168 hours  Radiological Exams on Admission: Dg Hip Complete Right  02/23/2013   CLINICAL DATA:  Right hip pain post fall.  EXAM: RIGHT HIP - COMPLETE 2+ VIEW  COMPARISON:  CT 11/17/2008  FINDINGS: Exam demonstrates mild symmetric degenerative change of the hips. There is a minimally displaced subcapital right femoral neck fracture. Cannot exclude a subtle nondisplaced fracture along the left side of the symphysis pubis joint. There multiple surgical clips over the pelvic sidewalls. There are multiple soft tissue calcifications.  IMPRESSION: Displaced right subcapital femoral neck fracture. Cannot exclude a nondisplaced fracture along the left side of the symphysis pubis joint.   Electronically Signed   By: Elberta Fortis M.D.   On: 02/23/2013 21:55   Dg Femur Right  02/23/2013   CLINICAL DATA:  Fall with right hip pain.  EXAM: RIGHT FEMUR - 2 VIEW  COMPARISON:  11/17/2008 CT  FINDINGS: There is a probable femoral neck fracture with shortening, but overlying gluteal calcification obscures detail.  There is no evidence of subluxation or dislocation.  No focal bony lesions are present.  IMPRESSION: Probable femoral neck  fracture with shortening. Consider dedicated imaging of the hip secondary to overlying gluteal calcifications.   Electronically Signed   By: Laveda Abbe M.D.   On: 02/23/2013 21:13    EKG: Independently reviewed. NSR, no acute ST t wave changes  Assessment/Plan  1. R Hip fracture -Ortho consulted per EDP, D/w Dr.Hewitt -keep NPO after midnight -low cardiovascular risk, EKG without acute changes -start DVt prophylaxis post op  2.  HYPOTHYROIDISM -continue synthroid  3.  Multiple sclerosis -continue Dalfampridine/baclofen -followed by Trudi Ida in [place], [place]  4. Osteoporosis -need outpatient management for this  DVT proph: start post op   Code Status:Full Code Family Communication:d/w pt and husband at bedside Disposition Plan: inpatient  Time spent:  [facility] Pager [phone]  If 7PM-7AM, please contact night-coverage [email or web address] Password [email or web address] 02/23/2013, 11:15 PM

## 2013-02-24 ENCOUNTER — Telehealth: Payer: Self-pay | Admitting: Internal Medicine

## 2013-02-24 ENCOUNTER — Inpatient Hospital Stay (HOSPITAL_COMMUNITY): Payer: 59 | Admitting: Anesthesiology

## 2013-02-24 ENCOUNTER — Encounter (HOSPITAL_COMMUNITY): Payer: 59 | Admitting: Anesthesiology

## 2013-02-24 ENCOUNTER — Encounter (HOSPITAL_COMMUNITY)
Admission: EM | Disposition: A | Discharge status: Rehabilitation Facility | Payer: Self-pay | Source: Home / Self Care | Attending: Pulmonary Disease

## 2013-02-24 ENCOUNTER — Inpatient Hospital Stay (HOSPITAL_COMMUNITY): Payer: 59

## 2013-02-24 DIAGNOSIS — J9600 Acute respiratory failure, unspecified whether with hypoxia or hypercapnia: Secondary | ICD-10-CM

## 2013-02-24 DIAGNOSIS — R52 Pain, unspecified: Secondary | ICD-10-CM

## 2013-02-24 DIAGNOSIS — J9601 Acute respiratory failure with hypoxia: Secondary | ICD-10-CM

## 2013-02-24 DIAGNOSIS — E871 Hypo-osmolality and hyponatremia: Secondary | ICD-10-CM

## 2013-02-24 DIAGNOSIS — S72009A Fracture of unspecified part of neck of unspecified femur, initial encounter for closed fracture: Secondary | ICD-10-CM

## 2013-02-24 DIAGNOSIS — J9621 Acute and chronic respiratory failure with hypoxia: Secondary | ICD-10-CM

## 2013-02-24 DIAGNOSIS — S72009D Fracture of unspecified part of neck of unspecified femur, subsequent encounter for closed fracture with routine healing: Secondary | ICD-10-CM

## 2013-02-24 LAB — BASIC METABOLIC PANEL
BUN: 16 mg/dL (ref 6–23)
BUN: 15 mg/dL (ref 6–23)
CO2: 20 mEq/L (ref 19–32)
CO2: 21 mEq/L (ref 19–32)
Calcium: 8.6 mg/dL (ref 8.4–10.5)
Calcium: 9 mg/dL (ref 8.4–10.5)
Chloride: 102 mEq/L (ref 96–112)
Chloride: 104 mEq/L (ref 96–112)
Creatinine, Ser: 0.65 mg/dL (ref 0.50–1.10)
Creatinine, Ser: 0.67 mg/dL (ref 0.50–1.10)
GFR calc Af Amer: >90 mL/min (ref >90)
GFR calc Af Amer: >90 mL/min (ref >90)
GFR calc non Af Amer: >90 mL/min (ref >90)
GFR calc non Af Amer: >90 mL/min (ref >90)
Glucose, Bld: 117 mg/dL — ABNORMAL HIGH (ref 70–99)
Glucose, Bld: 101 mg/dL — ABNORMAL HIGH (ref 70–99)
Potassium: 4.1 mEq/L (ref 3.5–5.1)
Potassium: 4.1 mEq/L (ref 3.5–5.1)
Sodium: 133 mEq/L — ABNORMAL LOW (ref 135–145)
Sodium: 136 mEq/L (ref 135–145)

## 2013-02-24 LAB — CBC
HCT: 32 % — ABNORMAL LOW (ref 36.0–46.0)
Hemoglobin: 10.6 g/dL — ABNORMAL LOW (ref 12.0–15.0)
MCH: 28.7 pg (ref 26.0–34.0)
MCHC: 33.1 g/dL (ref 30.0–36.0)
MCV: 86.7 fL (ref 78.0–100.0)
Platelets: 239 10*3/uL (ref 150–400)
RBC: 3.69 MIL/uL — ABNORMAL LOW (ref 3.87–5.11)
RDW: 13.5 % (ref 11.5–15.5)
WBC: 9.9 10*3/uL (ref 4.0–10.5)

## 2013-02-24 LAB — BLOOD GAS, ARTERIAL
Acid-base deficit: 5.3 mmol/L — ABNORMAL HIGH (ref 0.0–2.0)
Bicarbonate: 20.2 mEq/L (ref 20.0–24.0)
Drawn by: 331471
O2 Content: 3 L/min
O2 Saturation: 86.3 %
Patient temperature: 37
TCO2: 19 mmol/L (ref 0–100)
pCO2 arterial: 41.6 mmHg (ref 35.0–45.0)
pH, Arterial: 7.306 — ABNORMAL LOW (ref 7.350–7.450)
pO2, Arterial: 54.8 mmHg — ABNORMAL LOW (ref 80.0–100.0)

## 2013-02-24 LAB — LACTIC ACID, PLASMA: Lactic Acid, Venous: 1.3 mmol/L (ref 0.5–2.2)

## 2013-02-24 LAB — MRSA PCR SCREENING: MRSA by PCR: NEGATIVE

## 2013-02-24 SURGERY — HEMIARTHROPLASTY, HIP, DIRECT ANTERIOR APPROACH, FOR FRACTURE
Anesthesia: General

## 2013-02-24 MED ORDER — MORPHINE SULFATE (PF) 1 MG/ML IV SOLN
INTRAVENOUS | Status: DC
  Administered 2013-02-24: 3 mg via INTRAVENOUS
  Administered 2013-02-24 – 2013-02-25 (×2): via INTRAVENOUS
  Administered 2013-02-25: 7.8 mg via INTRAVENOUS
  Administered 2013-02-25: 6 mg via INTRAVENOUS
  Administered 2013-02-25: 10 mg via INTRAVENOUS
  Administered 2013-02-25: 6 mg via INTRAVENOUS
  Administered 2013-02-25: 20:00:00 via INTRAVENOUS
  Administered 2013-02-25: 3 mg via INTRAVENOUS
  Administered 2013-02-25: 7 mg via INTRAVENOUS
  Administered 2013-02-26: 4 mg via INTRAVENOUS
  Administered 2013-02-26: 6 mg via INTRAVENOUS
  Administered 2013-02-26: 3 mg via INTRAVENOUS
  Administered 2013-02-26: 9 mg via INTRAVENOUS
  Administered 2013-02-26: 7 mg via INTRAVENOUS
  Administered 2013-02-26: 14:00:00 via INTRAVENOUS
  Administered 2013-02-27 (×2): 1 mg via INTRAVENOUS
  Administered 2013-02-27: 10 mg via INTRAVENOUS
  Administered 2013-02-27: 17:00:00 via INTRAVENOUS
  Administered 2013-02-27 (×2): 2 mg via INTRAVENOUS
  Administered 2013-02-28: 4.93 mg via INTRAVENOUS
  Administered 2013-02-28: 2 mg via INTRAVENOUS
  Administered 2013-02-28: 3 mg via INTRAVENOUS
  Administered 2013-02-28: 2 mg via INTRAVENOUS
  Administered 2013-02-28: 5 mg via INTRAVENOUS
  Administered 2013-03-01: 17:00:00 via INTRAVENOUS
  Administered 2013-03-01: 8 mg via INTRAVENOUS
  Administered 2013-03-01: 9 mg via INTRAVENOUS
  Administered 2013-03-01: 1 mg via INTRAVENOUS
  Administered 2013-03-01: 13:00:00 via INTRAVENOUS
  Administered 2013-03-01: 6 mg via INTRAVENOUS
  Administered 2013-03-01: 7 mg via INTRAVENOUS
  Administered 2013-03-02: 3 mg via INTRAVENOUS
  Administered 2013-03-02: 1 mg via INTRAVENOUS
  Administered 2013-03-03: 2 mg via INTRAVENOUS
  Filled 2013-02-24 (×8): qty 25

## 2013-02-24 MED ORDER — DIPHENHYDRAMINE HCL 12.5 MG/5ML PO ELIX: 12.5000 mg | ORAL_SOLUTION | Freq: Four times a day (QID) | ORAL | Status: DC | PRN

## 2013-02-24 MED ORDER — DIPHENHYDRAMINE HCL 50 MG/ML IJ SOLN: 12.5000 mg | Freq: Four times a day (QID) | INTRAMUSCULAR | Status: DC | PRN

## 2013-02-24 MED ORDER — SODIUM CHLORIDE 0.9 % IJ SOLN: 9.0000 mL | INTRAMUSCULAR | Status: DC | PRN

## 2013-02-24 MED ORDER — ALBUTEROL SULFATE (5 MG/ML) 0.5% IN NEBU
2.5000 mg | INHALATION_SOLUTION | Freq: Four times a day (QID) | RESPIRATORY_TRACT | Status: DC
  Administered 2013-02-25: 2.5 mg via RESPIRATORY_TRACT
  Filled 2013-02-24: qty 0.5

## 2013-02-24 MED ORDER — ONDANSETRON HCL 4 MG/2ML IJ SOLN
4.0000 mg | Freq: Four times a day (QID) | INTRAMUSCULAR | Status: DC | PRN
  Administered 2013-02-25: 4 mg via INTRAVENOUS
  Filled 2013-02-24: qty 2

## 2013-02-24 MED ORDER — DOCUSATE SODIUM 100 MG PO CAPS
100.0000 mg | ORAL_CAPSULE | Freq: Two times a day (BID) | ORAL | Status: DC
  Administered 2013-02-24 – 2013-03-08 (×18): 100 mg via ORAL
  Filled 2013-02-24 (×23): qty 1

## 2013-02-24 MED ORDER — ALBUTEROL SULFATE (5 MG/ML) 0.5% IN NEBU
2.5000 mg | INHALATION_SOLUTION | RESPIRATORY_TRACT | Status: DC
  Administered 2013-02-24 (×2): 2.5 mg via RESPIRATORY_TRACT
  Filled 2013-02-24 (×2): qty 0.5

## 2013-02-24 MED ORDER — NEOSTIGMINE METHYLSULFATE 1 MG/ML IJ SOLN
INTRAMUSCULAR | Status: AC
  Filled 2013-02-24: qty 10

## 2013-02-24 MED ORDER — SUCCINYLCHOLINE CHLORIDE 20 MG/ML IJ SOLN
INTRAMUSCULAR | Status: AC
  Filled 2013-02-24: qty 1

## 2013-02-24 MED ORDER — SENNA 8.6 MG PO TABS
2.0000 | ORAL_TABLET | Freq: Every day | ORAL | Status: DC
  Administered 2013-02-25 – 2013-03-07 (×7): 17.2 mg via ORAL
  Filled 2013-02-24 (×8): qty 2

## 2013-02-24 MED ORDER — FENTANYL CITRATE 0.05 MG/ML IJ SOLN
INTRAMUSCULAR | Status: AC
  Filled 2013-02-24: qty 5

## 2013-02-24 MED ORDER — ROCURONIUM BROMIDE 100 MG/10ML IV SOLN
INTRAVENOUS | Status: AC
  Filled 2013-02-24: qty 1

## 2013-02-24 MED ORDER — IPRATROPIUM BROMIDE 0.02 % IN SOLN
0.5000 mg | RESPIRATORY_TRACT | Status: DC
  Administered 2013-02-24 (×2): 0.5 mg via RESPIRATORY_TRACT
  Filled 2013-02-24 (×2): qty 2.5

## 2013-02-24 MED ORDER — GLYCOPYRROLATE 0.2 MG/ML IJ SOLN
INTRAMUSCULAR | Status: AC
  Filled 2013-02-24: qty 3

## 2013-02-24 MED ORDER — ONDANSETRON HCL 4 MG/2ML IJ SOLN
INTRAMUSCULAR | Status: AC
  Filled 2013-02-24: qty 2

## 2013-02-24 MED ORDER — DEXAMETHASONE SODIUM PHOSPHATE 10 MG/ML IJ SOLN
10.0000 mg | Freq: Three times a day (TID) | INTRAMUSCULAR | Status: DC
  Filled 2013-02-24 (×2): qty 1

## 2013-02-24 MED ORDER — IPRATROPIUM BROMIDE 0.02 % IN SOLN
0.5000 mg | Freq: Four times a day (QID) | RESPIRATORY_TRACT | Status: DC
  Administered 2013-02-24: 0.5 mg via RESPIRATORY_TRACT
  Filled 2013-02-24: qty 2.5

## 2013-02-24 MED ORDER — MOMETASONE FURO-FORMOTEROL FUM 100-5 MCG/ACT IN AERO
2.0000 | INHALATION_SPRAY | Freq: Two times a day (BID) | RESPIRATORY_TRACT | Status: DC
  Administered 2013-02-24 – 2013-02-25 (×2): 2 via RESPIRATORY_TRACT
  Filled 2013-02-24: qty 8.8

## 2013-02-24 MED ORDER — RACEPINEPHRINE HCL 2.25 % IN NEBU
0.5000 mL | INHALATION_SOLUTION | RESPIRATORY_TRACT | Status: DC | PRN
  Administered 2013-02-26 – 2013-03-06 (×9): 0.5 mL via RESPIRATORY_TRACT
  Filled 2013-02-24 (×9): qty 0.5

## 2013-02-24 MED ORDER — LIDOCAINE HCL (CARDIAC) 20 MG/ML IV SOLN
INTRAVENOUS | Status: AC
  Filled 2013-02-24: qty 5

## 2013-02-24 MED ORDER — MIDAZOLAM HCL 2 MG/2ML IJ SOLN
INTRAMUSCULAR | Status: AC
  Filled 2013-02-24: qty 2

## 2013-02-24 MED ORDER — ENOXAPARIN SODIUM 40 MG/0.4ML ~~LOC~~ SOLN
40.0000 mg | SUBCUTANEOUS | Status: DC
  Administered 2013-02-24 – 2013-02-28 (×5): 40 mg via SUBCUTANEOUS
  Filled 2013-02-24 (×7): qty 0.4

## 2013-02-24 MED ORDER — NALOXONE HCL 0.4 MG/ML IJ SOLN: 0.4000 mg | INTRAMUSCULAR | Status: DC | PRN

## 2013-02-24 MED ORDER — MORPHINE SULFATE 2 MG/ML IJ SOLN
2.0000 mg | INTRAMUSCULAR | Status: DC | PRN
  Administered 2013-02-24 (×2): 2 mg via INTRAVENOUS
  Filled 2013-02-24 (×2): qty 1

## 2013-02-24 MED ORDER — POLYETHYLENE GLYCOL 3350 17 G PO PACK
17.0000 g | PACK | Freq: Every day | ORAL | Status: DC
  Administered 2013-02-25 – 2013-03-08 (×6): 17 g via ORAL
  Filled 2013-02-24 (×11): qty 1

## 2013-02-24 MED ORDER — PROPOFOL 10 MG/ML IV BOLUS
INTRAVENOUS | Status: AC
  Filled 2013-02-24: qty 20

## 2013-02-24 MED ORDER — IPRATROPIUM BROMIDE 0.02 % IN SOLN
0.5000 mg | Freq: Four times a day (QID) | RESPIRATORY_TRACT | Status: DC
  Administered 2013-02-25: 0.5 mg via RESPIRATORY_TRACT
  Filled 2013-02-24: qty 2.5

## 2013-02-24 MED ORDER — ALBUTEROL SULFATE (5 MG/ML) 0.5% IN NEBU
2.5000 mg | INHALATION_SOLUTION | Freq: Four times a day (QID) | RESPIRATORY_TRACT | Status: DC
  Administered 2013-02-24: 2.5 mg via RESPIRATORY_TRACT
  Filled 2013-02-24: qty 0.5

## 2013-02-24 NOTE — Consult Note (Addendum)
PULMONARY  / CRITICAL CARE MEDICINE  Name: Kayla [name] MRN: [id number] DOB: 06-07-1950    ADMISSION DATE:  02/23/2013 CONSULTATION DATE:  12/04  REFERRING MD :  Malachi Bonds PRIMARY SERVICE: [facility]  REASON FOR CONSULTATION:  Dyspnea, hypoxemia  BRIEF PATIENT DESCRIPTION:  16 F with MS and chronic foot drop suffered fall and R hip fx 12/03. Adm by [facility] with plans for ORIF 12/04. Surgery cancelled and pt transferred to ICU/SDU for increasing hypoxemia.  SIGNIFICANT EVENTS / STUDIES:  12/03 admitted with R hip fx 12/04 [facility] to ICU/SDU for increasing dyspnea and hypoxemia  LINES / TUBES:   CULTURES:   ANTIBIOTICS:   HISTORY OF PRESENT ILLNESS:   Admission history reviewed. On evening of this evaluation, she was transferred to ICU/SDU for hypoxemia and stridor. At present she is not in overt distress. She denies dyspnea. Her only complaint is R hip pain. Her voice is strong but she does have audible inspiratory stridor.  PAST MEDICAL HISTORY :  Past Medical History  Diagnosis Date  . Multiple sclerosis   . Unspecified hypothyroidism   . Other voice and resonance disorders   . Other diseases of vocal cords   . Cervical cancer 2005   Past Surgical History  Procedure Laterality Date  . Vesicovaginal fistula closure w/ tah     Prior to Admission medications   Medication Sig Start Date End Date Taking? Authorizing Provider  AMPYRA 10 MG TB12 Take 1 tablet by mouth 2 (two) times daily. 06/24/10  Yes Historical Provider, MD  baclofen (LIORESAL) 20 MG tablet Take 20 mg by mouth. 1 1/2 tabs four times a day    Yes Historical Provider, MD  dexlansoprazole (DEXILANT) 60 MG capsule Take 60 mg by mouth daily before breakfast.   Yes Historical Provider, MD  fesoterodine (TOVIAZ) 4 MG TB24 tablet Take 4 mg by mouth daily.   Yes Historical Provider, MD  FLUoxetine (PROZAC) 40 MG capsule Take 40 mg by mouth daily.     Yes Historical Provider, MD  gabapentin (NEURONTIN) 600 MG tablet Take 600 mg by  mouth 2 (two) times daily.   Yes Historical Provider, MD  levothyroxine (SYNTHROID) 75 MCG tablet Take 1 tablet (75 mcg total) by mouth daily. 07/30/12 07/30/13 Yes [name], MD  naproxen (NAPROSYN) 500 MG tablet Take 500 mg by mouth 2 (two) times daily with a meal.     Yes Historical Provider, MD  PRESCRIPTION MEDICATION 1 each every 30 (thirty) days. tysabri infusion taken at [facility] on 02/16/2013   Yes Historical Provider, MD  temazepam (RESTORIL) 15 MG capsule Take 15 mg by mouth daily as needed.     Yes Historical Provider, MD   Allergies  Allergen Reactions  . Oxycodone-Acetaminophen     REACTION: unspecified  . Oxycodone-Acetaminophen     Rash head to toe  . Percocet [Oxycodone-Acetaminophen]     hallucinations  . Phenytoin     REACTION: rash  . Zanaflex [Tizanidine Hydrochloride]   . Phenytoin Sodium Extended Rash    FAMILY HISTORY:  Family History  Problem Relation Age of Onset  . Colon cancer Father    SOCIAL HISTORY:  reports that she has never smoked. She has never used smokeless tobacco. She reports that she drinks alcohol. She reports that she does not use illicit drugs.  REVIEW OF SYSTEMS:  No CP, cough, purulent sputum, hemoptysis, LE edema or calf tenderness.   SUBJECTIVE:   VITAL SIGNS: Temp:  [98.3 F (36.8  C)-99.4 F (37.4 C)] 99.4 F (37.4 C) (12/04 1406) Pulse Rate:  [72-86] 86 (12/04 1406) Resp:  [14-19] 19 (12/04 1406) BP: (94-121)/(59-71) 118/71 mmHg (12/04 1406) SpO2:  [88 %-100 %] 88 % (12/04 1406) Weight:  [58.514 kg (129 lb)] 58.514 kg (129 lb) (12/03 2002) HEMODYNAMICS:   VENTILATOR SETTINGS:   INTAKE / OUTPUT: Intake/Output     12/03 0701 - 12/04 0700 12/04 0701 - 12/05 0700   I.V. (mL/kg) 395 (6.8)    Total Intake(mL/kg) 395 (6.8)    Urine (mL/kg/hr) 950 120 (0.2)   Total Output 950 120   Net -555 -120          PHYSICAL EXAMINATION: General:  WDWN, NAD, intermittent audible stridor Neuro:  Cognition  intact, no focal deficits HEENT: WNL Cardiovascular:  RRR s M Lungs: clear anteriorly Abdomen: soft, NT, NABS Ext: cool distally, no edema  LABS: I have reviewed all of today's lab results. Relevant abnormalities are discussed in the A/P section   CXR: Suboptimal inspiratory effect with elevated right diaphragm. No acute infiltrate or effusion.   ASSESSMENT / PLAN:  PULMONARY A:  Acute hypoxic resp failure This is poorly explained by CXR (which looks entirely clear) Possible atelectasis not evident on CXR Doubt VTE (this would be very early in course post hip fx) Stridor Seems intermittent - ? functional vs anatomic cause  P:   Monitor in ICU/SDU Cont supplemental O2 ENT to see and clarify what her ENT hx is  CARDIOVASCULAR A: No Issues P:  Monitor  RENAL A:  Mild hyponatremia P:   Monitor BMET intermittently Correct electrolytes as indicated  GASTROINTESTINAL A:  Chronic PPI therapy P:   SUP: PO pantoprazole   HEMATOLOGIC A:  Mild anemia without overt bleeding High risk of DVT P:  DVT px: enoxaparin ordered 12/04 Monitor CBC intermittently  INFECTIOUS A:  No Issues P:   Micro and abx as above  ENDOCRINE A:  No Issues P:   Monitor glu on chem panels  NEUROLOGIC A:  Multiple sclerosis Severe R hip pain P:   Reduced dose PCA MSO4 ordered 12/05  TODAY'S SUMMARY:   I have personally obtained a history, examined the patient, evaluated laboratory and imaging results, formulated the assessment and plan and placed orders. CRITICAL CARE: The patient is critically ill with multiple organ systems failure and requires high complexity decision making for assessment and support, frequent evaluation and titration of therapies, application of advanced monitoring technologies and extensive interpretation of multiple databases. Critical Care Time devoted to patient care services described in this note is  minutes.   Billy Fischer, MD ; [facility]  [phone].  After 5:30 PM or weekends, call [phone]  Pulmonary and Critical Care Medicine [facility] Pager: [phone]  02/24/2013, 5:01 PM

## 2013-02-24 NOTE — Progress Notes (Signed)
Clinical Social Work Department BRIEF PSYCHOSOCIAL ASSESSMENT 02/24/2013  Patient:  Kayla [name], Kayla [name]     Account Number:  [id number]     Admit date:  02/23/2013  Clinical Social Worker:  Candie Chroman  Date/Time:  02/24/2013 08:26 AM  Referred by:  CSW  Date Referred:  02/24/2013 Referred for  Other - See comment   Other Referral:   HIP FX   Interview type:  Patient Other interview type:    PSYCHOSOCIAL DATA Living Status:  HUSBAND Admitted from facility:   Level of care:   Primary support name:  Carmelina Dane Primary support relationship to patient:  SPOUSE Degree of support available:   supportive    CURRENT CONCERNS Current Concerns  Other - See comment   Other Concerns:   HOME vs SNF    SOCIAL WORK ASSESSMENT / PLAN Pt is a 62 yr old female living at home prior to hospitalization. PN reviewed. Pt fell sustaining a hip fx. Ortho  has been consulted and pt is expecting surgery. CSW met with pt / family at bedside to explain role of CSW and answer question r/t to d/c planning. CSW will meet again with pt / family following surgery and PT recommendations.   Assessment/plan status:  Psychosocial Support/Ongoing Assessment of Needs Other assessment/ plan:   Information/referral to community resources:   Role of CSW explained. D/C options reviewed ( HH / OUT PT THERAPY/ SNF ).    PATIENT'S/FAMILY'S RESPONSE TO PLAN OF CARE: " I want to go home. " Pt is uncomfortable and hoping to have surgery today. Pt / family are looking forward to discussing d/c options once surgery is completed.   Cori Razor LCSW [phone]

## 2013-02-24 NOTE — Progress Notes (Addendum)
Called to bedside because of decreasing oxygen saturations during the course of the day today.  At bedside, patient states she does not feel more Kemisha Bonnette of breath and denies difficulty taking a deep breath.  Denies cough and fever.  She has received multiple doses of pain medication today for her leg pain and per her husband, sometimes the pain medication increases her baseline stridor.    On exam, mildly increased stridor and now oxygen saturations are in high 80s on 3L [place] (room air at baseline).  Difficult resp exam due to loud inspiratory and expiratory upper airway noises.    Has history of MS, COPD PFT's October 04, 2009 FEV1 2.15 (95%) with ratio 69 and DLCO 58> corrects to 98, repeated vocal cord surgeries with baseline stridor, history of pulmonary edema with normal EF in 2005 and complicated post-operative course after hysterectomy, dysphagia managed with swallowing techniques.    Acute hypoxic respiratory failure:  DDx includes diastolic heart failure, pneumonia (particularly aspiration), vocal cord dysfunction due to mechanical failure such as mucous secretion not being cleared or neurologic failure such as MS flare, developing COPD exacerbation, PE, although not tachycardic.  May be difficult extubation if there is a problem with her vocal cords.    -  CXR:  New elevation of the right hemidiaphragm and ABG:  7.30/41/54.8 -  Consider CT angio chest -  Continuous pulse oximetry -  Judicious fluids -  Incentive spirometry -  ENT consultation  -  Will notify PCCM of patient's decompensation in case she needs increased ventilatory support  -  Minimize narcotics and other sedating medications -  Transfer to stepdown for monitoring -  Defer surgery until further evaluation of developing hypoxia and worsening stridor complete -  Anesthesia notified and attempting to contact orthopedics

## 2013-02-24 NOTE — Anesthesia Preprocedure Evaluation (Deleted)
Anesthesia Evaluation  Patient identified by MRN, date of birth, ID band Patient awake    Reviewed: Allergy & Precautions, H&P , NPO status , Patient's Chart, lab work & pertinent test results  Airway       Dental  (+) Dental Advisory Given   Pulmonary shortness of breath and with exertion,          Cardiovascular negative cardio ROS      Neuro/Psych History of MS negative psych ROS   GI/Hepatic negative GI ROS, Neg liver ROS, GERD-  Medicated,  Endo/Other  Hypothyroidism   Renal/GU negative Renal ROS     Musculoskeletal negative musculoskeletal ROS (+)   Abdominal   Peds  Hematology negative hematology ROS (+)   Anesthesia Other Findings   Reproductive/Obstetrics                          Anesthesia Physical Anesthesia Plan  ASA: II  Anesthesia Plan: General   Post-op Pain Management:    Induction: Intravenous  Airway Management Planned: Oral ETT  Additional Equipment:   Intra-op Plan:   Post-operative Plan: Extubation in OR  Informed Consent: I have reviewed the patients History and Physical, chart, labs and discussed the procedure including the risks, benefits and alternatives for the proposed anesthesia with the patient or authorized representative who has indicated his/her understanding and acceptance.   Dental advisory given  Plan Discussed with: CRNA  Anesthesia Plan Comments:         Anesthesia Quick Evaluation

## 2013-02-24 NOTE — Telephone Encounter (Signed)
Will forward to MW as an FYI 

## 2013-02-24 NOTE — Progress Notes (Signed)
eLink Physician-Brief Progress Note Patient Name: Kayla [name] DOB: November 03, 1950 MRN: [id number]  Date of Service  02/24/2013   HPI/Events of Note   Improved, family reports bradycardia from decadron  eICU Interventions  D dc decadron, not on home roids and BP wnl Stridor improved   Intervention Category Minor Interventions: Routine modifications to care plan (e.g. PRN medications for pain, fever)  Nelda Bucks. 02/24/2013, 10:34 PM

## 2013-02-24 NOTE — Progress Notes (Addendum)
[facility] PROGRESS NOTE  Kayla [name] AVW:[id number] DOB: 23-Dec-1950 DOA: 02/23/2013 PCP: Sandrea Hughs, MD  Assessment/Plan  Right hip fracture:  Severe pain of the right hip not adequately controlled with morphine -  Appreciate orthopedics assistance -  Defer surgery until respiratory issues are clarified -  DVT prophylaxis per orthopedics -  Of note, patient did not tolerate anesthesia well during her hysterectomy  Acute hypoxic respiratory failure.  Stridor was worse transiently but now back to baseline per family.  New elevation of right hemidiaphragm on CXR.  No evidence of pneumonia or pulmonary edema.  ABG may be VBG? Or suggestive of mixed metabolic and respiratory acidosis?  PO2 clearly low on 3L.   -  Stepdown with continuous pulse oximetry -  Consider repeating ABG -  Continue duonebs -  Consider CTa to rule out PE >> will await pulmonology assessment -  ENT consult to verify vocal cord paralysis is not worsening and contributing to hypoxia  COPD without exacerbation -  Continue dulera and nebulizer treatments  Metabolic acidosis suggested by ABG -  Check BMP and lactic acid  MS, may have exacerbation causing elevation of hemidiaphragm and weakness -continue Dalfampridine/baclofen  -followed by Trudi Ida in [place] -  Consider MRI and steroids   OP, defer to outpatient management   Normocytic anemia, likely anemia of chronic disease  -  Iron panel, folate, B12 with AM labs -  Defer TSH due to acute illness  Hyponatremia, may be due to dehydration as improving with IVF.  Mild and asymptomatic -  Repeat in AM  Mild hyperglycemia, likely stress-related from fracture -  A1c with AM labs Diet:  CLD for now and NPO again at MN for surgery tomorrow Access:  PIV IVF:  yes Proph:  SCDs for now  Code Status: full Family Communication: spoke with patient, her husband, and two daughters Disposition Plan: transfer to  stepdown   Consultants:  Orthopedics  ENT  PCCM  Procedures:  XR femur, hip  CXR  Antibiotics:  None   HPI/Subjective:  States she has severe pain in the right leg.  Breathing is not very Towana Stenglein.  Denies constipation and diarrhea.  Feels thirsty.    Objective: Filed Vitals:   02/23/13 2355 02/24/13 0621 02/24/13 0824 02/24/13 1406  BP: 94/59 107/70  118/71  Pulse: 79 74  86  Temp: 98.3 F (36.8 C) 98.5 F (36.9 C)  99.4 F (37.4 C)  TempSrc: Oral Oral  Oral  Resp: 19 18  19   Height:      Weight:      SpO2: 95% 95% 100% 88%    Intake/Output Summary (Last 24 hours) at 02/24/13 1658 Last data filed at 02/24/13 1407  Gross per 24 hour  Intake    395 ml  Output   1070 ml  Net   -675 ml   Filed Weights   02/23/13 2002  Weight: 58.514 kg (129 lb)    Exam:   General:  CF, currently baseline stridor, no acute distress   HEENT:  NCAT, MMM  Cardiovascular:  RRR, nl S1, S2 no mrg, 2+ pulses, warm extremities Respiratory:  Stridor this morning and on my last exam is at baseline.  During middle visit, had prominent stridor with retractions. Difficult to hear other airway noises due to degree of upper airway noise.  Currently, no increased WOB  Abdomen:   NABS, soft, NT/ND  MSK:   Normal tone and bulk, Right leg externally rotated.  2+  pulse, < 2 sec CR, pain with movement  Neuro:  Seems to have some difficulty keeping eye lids open, particularly the left, pain limits exam of RLE  Psych:  A&O x 4  Data Reviewed: Basic Metabolic Panel:  Recent Labs Lab 02/23/13 2045 02/24/13 0516  NA 132* 133*  K 4.5 4.1  CL 97 102  CO2 25 20  GLUCOSE 101* 117*  BUN 16 16  CREATININE 0.70 0.65  CALCIUM 9.6 8.6   Liver Function Tests: No results found for this basename: AST, ALT, ALKPHOS, BILITOT, PROT, ALBUMIN,  in the last 168 hours No results found for this basename: LIPASE, AMYLASE,  in the last 168 hours No results found for this basename: AMMONIA,  in the  last 168 hours CBC:  Recent Labs Lab 02/23/13 2045 02/24/13 0516  WBC 7.9 9.9  NEUTROABS 5.3  --   HGB 11.3* 10.6*  HCT 33.2* 32.0*  MCV 86.2 86.7  PLT 287 239   Cardiac Enzymes: No results found for this basename: CKTOTAL, CKMB, CKMBINDEX, TROPONINI,  in the last 168 hours BNP (last 3 results) No results found for this basename: PROBNP,  in the last 8760 hours CBG: No results found for this basename: GLUCAP,  in the last 168 hours  No results found for this or any previous visit (from the past 240 hour(s)).   Studies: Dg Hip Complete Right  02/23/2013   CLINICAL DATA:  Right hip pain post fall.  EXAM: RIGHT HIP - COMPLETE 2+ VIEW  COMPARISON:  CT 11/17/2008  FINDINGS: Exam demonstrates mild symmetric degenerative change of the hips. There is a minimally displaced subcapital right femoral neck fracture. Cannot exclude a subtle nondisplaced fracture along the left side of the symphysis pubis joint. There multiple surgical clips over the pelvic sidewalls. There are multiple soft tissue calcifications.  IMPRESSION: Displaced right subcapital femoral neck fracture. Cannot exclude a nondisplaced fracture along the left side of the symphysis pubis joint.   Electronically Signed   By: Elberta Fortis M.D.   On: 02/23/2013 21:55   Dg Femur Right  02/23/2013   CLINICAL DATA:  Fall with right hip pain.  EXAM: RIGHT FEMUR - 2 VIEW  COMPARISON:  11/17/2008 CT  FINDINGS: There is a probable femoral neck fracture with shortening, but overlying gluteal calcification obscures detail.  There is no evidence of subluxation or dislocation.  No focal bony lesions are present.  IMPRESSION: Probable femoral neck fracture with shortening. Consider dedicated imaging of the hip secondary to overlying gluteal calcifications.   Electronically Signed   By: Laveda Abbe M.D.   On: 02/23/2013 21:13   Dg Chest Port 1 View  02/24/2013   CLINICAL DATA:  Preop for hip surgery, shortness of breath  EXAM: PORTABLE CHEST - 1  VIEW  COMPARISON:  09/23/2012  FINDINGS: The heart size and vascular pattern are normal. Mild elevation of the right diaphragm. Suboptimal inspiratory effect. No consolidation or effusion.  IMPRESSION: Suboptimal inspiratory effect with elevated right diaphragm. No acute infiltrate or effusion.   Electronically Signed   By: Esperanza Heir M.D.   On: 02/24/2013 16:21    Scheduled Meds: . ipratropium  0.5 mg Nebulization Q4H   And  . albuterol  2.5 mg Nebulization Q4H  . dalfampridine  10 mg Oral BID  . docusate sodium  100 mg Oral BID  . fesoterodine  4 mg Oral Daily  . FLUoxetine  40 mg Oral Daily  . gabapentin  600 mg Oral BID  .  levothyroxine  75 mcg Oral Daily  . mometasone-formoterol  2 puff Inhalation BID  . pantoprazole  40 mg Oral Daily  . polyethylene glycol  17 g Oral Daily  . senna  2 tablet Oral QHS   Continuous Infusions: . sodium chloride 75 mL/hr at 02/24/13 0044    Active Problems:   HYPOTHYROIDISM   Multiple sclerosis   Fractured femoral neck   Hip fracture    Time spent: 30 min    Novelle Addair, [facility]  [facility] Pager [phone]. If 7PM-7AM, please contact night-coverage at [email or web address], password [facility] 02/24/2013, 4:58 PM  LOS: 1 day

## 2013-02-24 NOTE — Consult Note (Signed)
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 Had microdirect laryngosocpy by Dr. Lucky Cowboy back in 2007, has been followed by Dr. [name] since then.  Allergies:  Allergies  Allergen Reactions  . Oxycodone-Acetaminophen     REACTION: unspecified  . Oxycodone-Acetaminophen     Rash head to toe  . Percocet [Oxycodone-Acetaminophen]     hallucinations  . Phenytoin     REACTION: rash  . Zanaflex [Tizanidine Hydrochloride]   . Phenytoin Sodium Extended Rash    ROS: right hip pain, shortness of breath, stridor, otherwise negative x 12 systems except per HPI.  PMH:  Past Medical History  Diagnosis Date  . Multiple sclerosis   . Unspecified hypothyroidism   . Other voice and resonance disorders   . Other diseases of vocal cords   . Cervical cancer 2005    FH:  Family History  Problem Relation Age of Onset  . Colon cancer Father     SH:  History   Social History  . Marital Status: Married    Spouse Name: N/A    Number of Children: N/A  . Years of Education: N/A   Occupational History  . disabled [employer]    Social History Main Topics  . Smoking status: Never Smoker   . Smokeless tobacco: Never Used  . Alcohol Use: Yes     Comment: rare  . Drug Use: No  . Sexual Activity: Not on file   Other Topics Concern  . Not on file   Social History Narrative  . No narrative on file    PSH:  Past Surgical History  Procedure Laterality Date  . Vesicovaginal fistula closure w/ tah      Physical  Exam:  EAC/TMs normal BL. Oral cavity, lips, gums, ororpharynx normal with no masses or lesions. Skin warm and dry. Nasal cavity without polyps or purulence.  External nose and ears without masses or lesions. EOMI, PERRLA. Neck supple with no masses or lesions. No lymphadenopathy palpated. Trachea midline. Has some soft inspiratory stridor when seen by ENT. Nasal cannula oxygen in place.  Procedure Note: 31575 Informed verbal consent was obtained after explaining the risks (including bleeding and infection), benefits and alternatives of the procedure. Verbal timeout was performed prior to the procedure. The nose was topicalized with topical lidocaine/oxymetazoline. The 4mm flexible scope was advanced through the left nasal cavity. The septum and turbinates appeared normal. The middle meatus was free of polyps or purulence. The eustachian tube, choana, and adenoids were normal in appearance. The hypopharynx, arytenoids, epiglottis, and  false vocal folds appeared normal with no masses or lesions. The true vocal folds show no masses or lesions, but there is true vocal fold paresis bilaterally with just 1 to 2 mm of abduction with inspiration bilaterally. Otherwise the true vocal folds are paramedian and there is a narrow ~72mm glottic airway anteriorly. Posteriorly there is a ~4-45mm glottic airway, especially with inspiration. The arytenoids appear normal. Subglottic visualization was limited but the visualized portion of the subglottis appeared normal. The patient tolerated the procedure with no immediate complications.  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 I am unclear of the etiology but Dr. [name] or Dr. Gerilyn Pilgrim may have worked this up  previously. She may benefit from a CT neck/chest or MRI vagus nerve protocol at some point. Acutely her stridor seems significantly improved. She should be intubatable for her  Hip surgery but may need to use a smaller tube such as a 5 or 6. Acutely I think she would also benefit from decadron IV and racemic epinephrine nebulizers as needed. At some point I think it would be beneficial for her to consider a laser  arytenoidectomy/partial laser cordotomy to give her a better glottic airway in the future, but she can see Dr. [name] regarding this once her acute medical issues are resolved/improved.   Melvenia Beam 02/24/2013 6:32 PM

## 2013-02-24 NOTE — Progress Notes (Signed)
Pt transported to ICU.

## 2013-02-24 NOTE — Consult Note (Signed)
Reason for Consult:   Right femoral neck fracture Referring Physician:     Emergency Room Physician  Kayla [name] is an 62 y.o. female.  HPI: Patient with PMH of relapsing MS followed by Dr.Douglas.  She does have foot drop and feels that the leg wasn't lifted high enough to get over a newer holiday rug.  She tripped and had a mechanical fall due to the rug, subsequently started having right hip pain and was unable to ambulate.   Subsequently EMS was called and brought to [facility] ER.   She was found to have a right subcapital femoral neck fracture, EDP discussed with Dr.Hewitt and [facility] was consulted for further evaluation and management.   Dr. Victorino Dike discussed with Dr. Charlann Boxer.  Discussion was had with patient and the family.  Risks, benefits and expectations were discussed with the patient and family.  Risks including but not limited to the risk of anesthesia, blood clots, nerve damage, blood vessel damage, failure of the prosthesis, infection and up to and including death.  Patient and family understand the risks, benefits and expectations and wishes to proceed with surgery. Surgery planned for a right hip hemiarthroplasty.   Past Medical History  Diagnosis Date  . Multiple sclerosis   . Unspecified hypothyroidism   . Other voice and resonance disorders   . Other diseases of vocal cords   . Cervical cancer 2005    Past Surgical History  Procedure Laterality Date  . Vesicovaginal fistula closure w/ tah      Family History  Problem Relation Age of Onset  . Colon cancer Father     Social History:  reports that she has never smoked. She has never used smokeless tobacco. She reports that she drinks alcohol. She reports that she does not use illicit drugs.  Allergies:  Allergies  Allergen Reactions  . Oxycodone-Acetaminophen     REACTION: unspecified  . Oxycodone-Acetaminophen     Rash head to toe  . Percocet [Oxycodone-Acetaminophen]     hallucinations  . Phenytoin     REACTION: rash   . Zanaflex [Tizanidine Hydrochloride]   . Phenytoin Sodium Extended Rash     Results for orders placed during the hospital encounter of 02/23/13 (from the past 48 hour(s))  BASIC METABOLIC PANEL     Status: Abnormal   Collection Time    02/23/13  8:45 PM      Result Value Range   Sodium 132 (*) 135 - 145 mEq/L   Potassium 4.5  3.5 - 5.1 mEq/L   Chloride 97  96 - 112 mEq/L   CO2 25  19 - 32 mEq/L   Glucose, Bld 101 (*) 70 - 99 mg/dL   BUN 16  6 - 23 mg/dL   Creatinine, Ser 4.54  0.50 - 1.10 mg/dL   Calcium 9.6  8.4 - 09.8 mg/dL   GFR calc non Af Amer >90  >90 mL/min   GFR calc Af Amer >90  >90 mL/min   Comment: (NOTE)     The eGFR has been calculated using the CKD EPI equation.     This calculation has not been validated in all clinical situations.     eGFR's persistently <90 mL/min signify possible Chronic Kidney     Disease.  CBC WITH DIFFERENTIAL     Status: Abnormal   Collection Time    02/23/13  8:45 PM      Result Value Range   WBC 7.9  4.0 - 10.5 K/uL   RBC  3.85 (*) 3.87 - 5.11 MIL/uL   Hemoglobin 11.3 (*) 12.0 - 15.0 g/dL   HCT 95.6 (*) 21.3 - 08.6 %   MCV 86.2  78.0 - 100.0 fL   MCH 29.4  26.0 - 34.0 pg   MCHC 34.0  30.0 - 36.0 g/dL   RDW 57.8  46.9 - 62.9 %   Platelets 287  150 - 400 K/uL   Neutrophils Relative % 67  43 - 77 %   Neutro Abs 5.3  1.7 - 7.7 K/uL   Lymphocytes Relative 23  12 - 46 %   Lymphs Abs 1.8  0.7 - 4.0 K/uL   Monocytes Relative 6  3 - 12 %   Monocytes Absolute 0.5  0.1 - 1.0 K/uL   Eosinophils Relative 3  0 - 5 %   Eosinophils Absolute 0.3  0.0 - 0.7 K/uL   Basophils Relative 0  0 - 1 %   Basophils Absolute 0.0  0.0 - 0.1 K/uL  URINALYSIS, ROUTINE W REFLEX MICROSCOPIC     Status: Abnormal   Collection Time    02/23/13 10:24 PM      Result Value Range   Color, Urine YELLOW  YELLOW   APPearance CLEAR  CLEAR   Specific Gravity, Urine 1.013  1.005 - 1.030   pH 6.5  5.0 - 8.0   Glucose, UA NEGATIVE  NEGATIVE mg/dL   Hgb urine  dipstick NEGATIVE  NEGATIVE   Bilirubin Urine NEGATIVE  NEGATIVE   Ketones, ur NEGATIVE  NEGATIVE mg/dL   Protein, ur NEGATIVE  NEGATIVE mg/dL   Urobilinogen, UA 0.2  0.0 - 1.0 mg/dL   Nitrite NEGATIVE  NEGATIVE   Leukocytes, UA TRACE (*) NEGATIVE  URINE MICROSCOPIC-ADD ON     Status: Abnormal   Collection Time    02/23/13 10:24 PM      Result Value Range   WBC, UA 3-6  <3 WBC/hpf   Bacteria, UA MANY (*) RARE  CBC     Status: Abnormal   Collection Time    02/24/13  5:16 AM      Result Value Range   WBC 9.9  4.0 - 10.5 K/uL   RBC 3.69 (*) 3.87 - 5.11 MIL/uL   Hemoglobin 10.6 (*) 12.0 - 15.0 g/dL   HCT 52.8 (*) 41.3 - 24.4 %   MCV 86.7  78.0 - 100.0 fL   MCH 28.7  26.0 - 34.0 pg   MCHC 33.1  30.0 - 36.0 g/dL   RDW 01.0  27.2 - 53.6 %   Platelets 239  150 - 400 K/uL  BASIC METABOLIC PANEL     Status: Abnormal   Collection Time    02/24/13  5:16 AM      Result Value Range   Sodium 133 (*) 135 - 145 mEq/L   Potassium 4.1  3.5 - 5.1 mEq/L   Chloride 102  96 - 112 mEq/L   CO2 20  19 - 32 mEq/L   Glucose, Bld 117 (*) 70 - 99 mg/dL   BUN 16  6 - 23 mg/dL   Creatinine, Ser 6.44  0.50 - 1.10 mg/dL   Calcium 8.6  8.4 - 03.4 mg/dL   GFR calc non Af Amer >90  >90 mL/min   GFR calc Af Amer >90  >90 mL/min   Comment: (NOTE)     The eGFR has been calculated using the CKD EPI equation.     This calculation has not been validated in all clinical  situations.     eGFR's persistently <90 mL/min signify possible Chronic Kidney     Disease.    Dg Hip Complete Right  02/23/2013   CLINICAL DATA:  Right hip pain post fall.  EXAM: RIGHT HIP - COMPLETE 2+ VIEW  COMPARISON:  CT 11/17/2008  FINDINGS: Exam demonstrates mild symmetric degenerative change of the hips. There is a minimally displaced subcapital right femoral neck fracture. Cannot exclude a subtle nondisplaced fracture along the left side of the symphysis pubis joint. There multiple surgical clips over the pelvic sidewalls. There are  multiple soft tissue calcifications.  IMPRESSION: Displaced right subcapital femoral neck fracture. Cannot exclude a nondisplaced fracture along the left side of the symphysis pubis joint.   Electronically Signed   By: Elberta Fortis M.D.   On: 02/23/2013 21:55   Dg Femur Right  02/23/2013   CLINICAL DATA:  Fall with right hip pain.  EXAM: RIGHT FEMUR - 2 VIEW  COMPARISON:  11/17/2008 CT  FINDINGS: There is a probable femoral neck fracture with shortening, but overlying gluteal calcification obscures detail.  There is no evidence of subluxation or dislocation.  No focal bony lesions are present.  IMPRESSION: Probable femoral neck fracture with shortening. Consider dedicated imaging of the hip secondary to overlying gluteal calcifications.   Electronically Signed   By: Laveda Abbe M.D.   On: 02/23/2013 21:13    Review of Systems  Constitutional: Negative.   HENT: Negative.   Eyes: Negative.   Respiratory: Negative.   Cardiovascular: Negative.   Gastrointestinal: Negative.   Genitourinary: Negative.   Musculoskeletal: Positive for back pain and joint pain.  Skin: Negative.   Neurological: Positive for speech change and focal weakness.  Endo/Heme/Allergies: Negative.   Psychiatric/Behavioral: Negative.    Blood pressure 107/70, pulse 74, temperature 98.5 F (36.9 C), temperature source Oral, resp. rate 18, height 5\' 3"  (1.6 m), weight 58.514 kg (129 lb), SpO2 100.00%. Physical Exam  Constitutional: She is oriented to person, place, and time. She appears well-developed and well-nourished.  HENT:  Head: Normocephalic and atraumatic.  Mouth/Throat: Oropharynx is clear and moist.  Eyes: Pupils are equal, round, and reactive to light.  Neck: Neck supple. No JVD present. No tracheal deviation present. No thyromegaly present.  Cardiovascular: Normal rate and intact distal pulses.   Respiratory: Effort normal and breath sounds normal. No respiratory distress. She has no wheezes.  GI: Soft. There is  no tenderness. There is no guarding.  Musculoskeletal:       Right hip: She exhibits decreased range of motion, decreased strength, tenderness, bony tenderness, swelling and deformity. She exhibits no laceration.  Lymphadenopathy:    She has no cervical adenopathy.  Neurological: She is alert and oriented to person, place, and time.  Skin: Skin is warm and dry.    Assessment/Plan: Right femoral neck fracture   NPO now. Obtain consent for right hip hemiarthroplasty. Pain to be controlled with IV medication in the mean time. Risks, benefits and expectations were discussed with the patient.  Risks including but not limited to the risk of anesthesia, blood clots, nerve damage, blood vessel damage, failure of the prosthesis, infection and up to and including death.  Patient understand the risks, benefits and expectations and wishes to proceed with surgery.     Gerrit Halls 02/24/2013, 11:52 AM

## 2013-02-24 NOTE — Care Management Note (Addendum)
    Page 1 of 2   03/08/2013     1:51:35 PM   CARE MANAGEMENT NOTE 03/08/2013  Patient:  EISHA, CHATTERJEE   Account Number:  [id number]  Date Initiated:  02/24/2013  Documentation initiated by:  Colleen Can  Subjective/Objective Assessment:   dx displaced rt femorl neck fracture following a fall     Action/Plan:   Pt will be having surgery. CM will follow post op   Anticipated DC Date:  03/10/2013   Anticipated DC Plan:  IP REHAB FACILITY  In-house referral  Clinical Social Worker      DC Planning Services  CM consult      PAC Choice  IP REHAB   Choice offered to / List presented to:             Status of service:  Completed, signed off Medicare Important Message given?   (If response is "NO", the following Medicare IM given date fields will be blank) Date Medicare IM given:   Date Additional Medicare IM given:    Discharge Disposition:  IP REHAB FACILITY  Per UR Regulation:  Reviewed for med. necessity/level of care/duration of stay  If discussed at Long Length of Stay Meetings, dates discussed:    Comments:  03/08/2013 Colleen Can BSN RN CCM [phone] Pt recently tranferred from stepdown unit to 6e, Inpatient rehab consult was in place. Today  rehab co-ordinator advised that bed was available at [facility] and that authorization had been obtained. Pt will transfer to facilityu todaY.   [id number] Earlene Plater, RN, BSN, [place] [phone] Chart Reviewed for discharge and hospital needs. Discharge needs at time of review:  None present will follow for needs. Review of patient progress due on [id number].  [id number] Earlene Plater.,RN,BSN,CCM: o2 demands decreasing weaning o2 and awaiting CIR evaluation.  [id number] Earlene Plater, RN, BSN, CCM [phone] Chart Reviewed for discharge and hospital needs.  Surgical repair of the hip postponed due to resp status, 02 down to75% fi02 increased to 40%. Discharge needs at time of review:  None present will  follow for needs. Review of patient progress due on [id number].   [id number] scheduled for or today for  follow post op/Rhonda Davis,RN,BSN,CCM  02/24/2013 Colleen Can BSN RN CCM [phone] Pt was transferred to [facility] unit d/c decline in health status.

## 2013-02-25 ENCOUNTER — Inpatient Hospital Stay (HOSPITAL_COMMUNITY): Payer: 59

## 2013-02-25 DIAGNOSIS — J383 Other diseases of vocal cords: Secondary | ICD-10-CM

## 2013-02-25 LAB — BASIC METABOLIC PANEL
BUN: 17 mg/dL (ref 6–23)
CO2: 20 mEq/L (ref 19–32)
Calcium: 8.7 mg/dL (ref 8.4–10.5)
Chloride: 103 mEq/L (ref 96–112)
Creatinine, Ser: 0.62 mg/dL (ref 0.50–1.10)
GFR calc Af Amer: >90 mL/min (ref >90)
GFR calc non Af Amer: >90 mL/min (ref >90)
Glucose, Bld: 88 mg/dL (ref 70–99)
Potassium: 3.8 mEq/L (ref 3.5–5.1)
Sodium: 134 mEq/L — ABNORMAL LOW (ref 135–145)

## 2013-02-25 LAB — URINE MICROSCOPIC-ADD ON

## 2013-02-25 LAB — LEGIONELLA ANTIGEN, URINE: Legionella Antigen, Urine: NEGATIVE

## 2013-02-25 LAB — CBC WITH DIFFERENTIAL/PLATELET
Basophils Absolute: 0 10*3/uL (ref 0.0–0.1)
Basophils Relative: 0 % (ref 0–1)
Eosinophils Absolute: 0.2 10*3/uL (ref 0.0–0.7)
Eosinophils Relative: 2 % (ref 0–5)
HCT: 30.1 % — ABNORMAL LOW (ref 36.0–46.0)
Hemoglobin: 10 g/dL — ABNORMAL LOW (ref 12.0–15.0)
Lymphocytes Relative: 7 % — ABNORMAL LOW (ref 12–46)
Lymphs Abs: 0.7 10*3/uL (ref 0.7–4.0)
MCH: 29.2 pg (ref 26.0–34.0)
MCHC: 33.2 g/dL (ref 30.0–36.0)
MCV: 88 fL (ref 78.0–100.0)
Monocytes Absolute: 1.2 10*3/uL — ABNORMAL HIGH (ref 0.1–1.0)
Monocytes Relative: 11 % (ref 3–12)
Neutro Abs: 8.5 10*3/uL — ABNORMAL HIGH (ref 1.7–7.7)
Neutrophils Relative %: 80 % — ABNORMAL HIGH (ref 43–77)
Platelets: 200 10*3/uL (ref 150–400)
RBC: 3.42 MIL/uL — ABNORMAL LOW (ref 3.87–5.11)
RDW: 13.9 % (ref 11.5–15.5)
WBC: 10.6 10*3/uL — ABNORMAL HIGH (ref 4.0–10.5)

## 2013-02-25 LAB — STREP PNEUMONIAE URINARY ANTIGEN: Strep Pneumo Urinary Antigen: NEGATIVE

## 2013-02-25 LAB — COMPREHENSIVE METABOLIC PANEL
ALT: 18 U/L (ref 0–35)
AST: 42 U/L — ABNORMAL HIGH (ref 0–37)
Albumin: 2.9 g/dL — ABNORMAL LOW (ref 3.5–5.2)
Alkaline Phosphatase: 62 U/L (ref 39–117)
BUN: 17 mg/dL (ref 6–23)
CO2: 20 mEq/L (ref 19–32)
Calcium: 8.8 mg/dL (ref 8.4–10.5)
Chloride: 100 mEq/L (ref 96–112)
Creatinine, Ser: 0.6 mg/dL (ref 0.50–1.10)
GFR calc Af Amer: >90 mL/min (ref >90)
GFR calc non Af Amer: >90 mL/min (ref >90)
Glucose, Bld: 84 mg/dL (ref 70–99)
Potassium: 3.8 mEq/L (ref 3.5–5.1)
Sodium: 133 mEq/L — ABNORMAL LOW (ref 135–145)
Total Bilirubin: 0.3 mg/dL (ref 0.3–1.2)
Total Protein: 5.8 g/dL — ABNORMAL LOW (ref 6.0–8.3)

## 2013-02-25 LAB — URINALYSIS, ROUTINE W REFLEX MICROSCOPIC
Bilirubin Urine: NEGATIVE
Glucose, UA: NEGATIVE mg/dL
Ketones, ur: >80 mg/dL — AB
Leukocytes, UA: NEGATIVE
Nitrite: NEGATIVE
Protein, ur: NEGATIVE mg/dL
Specific Gravity, Urine: 1.03 (ref 1.005–1.030)
Urobilinogen, UA: 0.2 mg/dL (ref 0.0–1.0)
pH: 6 (ref 5.0–8.0)

## 2013-02-25 LAB — LACTIC ACID, PLASMA: Lactic Acid, Venous: 0.7 mmol/L (ref 0.5–2.2)

## 2013-02-25 LAB — VITAMIN B12: Vitamin B-12: 247 pg/mL (ref 211–911)

## 2013-02-25 LAB — IRON AND TIBC
Iron: 23 ug/dL — ABNORMAL LOW (ref 42–135)
Saturation Ratios: 8 % — ABNORMAL LOW (ref 20–55)
TIBC: 274 ug/dL (ref 250–470)
UIBC: 251 ug/dL (ref 125–400)

## 2013-02-25 LAB — CBC
HCT: 30.3 % — ABNORMAL LOW (ref 36.0–46.0)
Hemoglobin: 10 g/dL — ABNORMAL LOW (ref 12.0–15.0)
MCH: 29.2 pg (ref 26.0–34.0)
MCHC: 33 g/dL (ref 30.0–36.0)
MCV: 88.3 fL (ref 78.0–100.0)
Platelets: 218 10*3/uL (ref 150–400)
RBC: 3.43 MIL/uL — ABNORMAL LOW (ref 3.87–5.11)
RDW: 13.8 % (ref 11.5–15.5)
WBC: 9.2 10*3/uL (ref 4.0–10.5)

## 2013-02-25 LAB — TRANSFERRIN: Transferrin: 199 mg/dL — ABNORMAL LOW (ref 200–360)

## 2013-02-25 LAB — HEMOGLOBIN A1C
Hgb A1c MFr Bld: 5.6 % (ref <5.7)
Mean Plasma Glucose: 114 mg/dL (ref <117)

## 2013-02-25 LAB — INFLUENZA PANEL BY PCR (TYPE A & B)
H1N1 flu by pcr: NOT DETECTED
Influenza A By PCR: NEGATIVE
Influenza B By PCR: NEGATIVE

## 2013-02-25 LAB — PROCALCITONIN: Procalcitonin: <0.1 ng/mL

## 2013-02-25 LAB — PRO B NATRIURETIC PEPTIDE: Pro B Natriuretic peptide (BNP): 1654 pg/mL — ABNORMAL HIGH (ref 0–125)

## 2013-02-25 LAB — FOLATE RBC: RBC Folate: 826 ng/mL — ABNORMAL HIGH (ref >280)

## 2013-02-25 LAB — FERRITIN: Ferritin: 42 ng/mL (ref 10–291)

## 2013-02-25 MED ORDER — AZITHROMYCIN 500 MG PO TABS
500.0000 mg | ORAL_TABLET | Freq: Every day | ORAL | Status: DC
  Administered 2013-02-25: 500 mg via ORAL
  Filled 2013-02-25: qty 1

## 2013-02-25 MED ORDER — IPRATROPIUM BROMIDE 0.02 % IN SOLN
0.5000 mg | Freq: Four times a day (QID) | RESPIRATORY_TRACT | Status: DC
  Administered 2013-02-25 – 2013-03-05 (×26): 0.5 mg via RESPIRATORY_TRACT
  Filled 2013-02-25 (×26): qty 2.5

## 2013-02-25 MED ORDER — ALBUTEROL SULFATE (5 MG/ML) 0.5% IN NEBU
2.5000 mg | INHALATION_SOLUTION | RESPIRATORY_TRACT | Status: DC | PRN
  Administered 2013-03-06: 2.5 mg via RESPIRATORY_TRACT
  Filled 2013-02-25: qty 0.5

## 2013-02-25 MED ORDER — VANCOMYCIN HCL IN DEXTROSE 750-5 MG/150ML-% IV SOLN
750.0000 mg | Freq: Two times a day (BID) | INTRAVENOUS | Status: DC
  Administered 2013-02-25 – 2013-02-27 (×5): 750 mg via INTRAVENOUS
  Filled 2013-02-25 (×6): qty 150

## 2013-02-25 MED ORDER — ALBUTEROL SULFATE (5 MG/ML) 0.5% IN NEBU: 2.5000 mg | INHALATION_SOLUTION | RESPIRATORY_TRACT | Status: DC | PRN

## 2013-02-25 MED ORDER — VITAMIN B-12 1000 MCG PO TABS
1000.0000 ug | ORAL_TABLET | Freq: Every day | ORAL | Status: DC
  Administered 2013-02-25 – 2013-03-08 (×9): 1000 ug via ORAL
  Filled 2013-02-25 (×12): qty 1

## 2013-02-25 MED ORDER — ALBUTEROL SULFATE (5 MG/ML) 0.5% IN NEBU
2.5000 mg | INHALATION_SOLUTION | Freq: Four times a day (QID) | RESPIRATORY_TRACT | Status: DC
  Administered 2013-02-25 – 2013-03-05 (×26): 2.5 mg via RESPIRATORY_TRACT
  Filled 2013-02-25 (×25): qty 0.5

## 2013-02-25 MED ORDER — PIPERACILLIN-TAZOBACTAM 3.375 G IVPB
3.3750 g | Freq: Three times a day (TID) | INTRAVENOUS | Status: DC
  Administered 2013-02-25 – 2013-02-27 (×5): 3.375 g via INTRAVENOUS
  Filled 2013-02-25 (×6): qty 50

## 2013-02-25 MED ORDER — CALCIUM CARBONATE ANTACID 500 MG PO CHEW
1.0000 | CHEWABLE_TABLET | Freq: Four times a day (QID) | ORAL | Status: DC | PRN
  Administered 2013-02-25: 200 mg via ORAL
  Filled 2013-02-25: qty 1

## 2013-02-25 MED ORDER — DEXTROSE 5 % IV SOLN
1.0000 g | Freq: Every day | INTRAVENOUS | Status: DC
  Administered 2013-02-25: 1 g via INTRAVENOUS
  Filled 2013-02-25: qty 10

## 2013-02-25 MED ORDER — IPRATROPIUM BROMIDE 0.02 % IN SOLN: 0.5000 mg | RESPIRATORY_TRACT | Status: DC | PRN

## 2013-02-25 NOTE — Progress Notes (Signed)
Brief Pharmacy Note: Rocephin per pharmacy  Treating PNA with Rocephin and po Azithromycin Rocephin is not renally excreted, no monitoring or levels necessary  Will order Rocephin 1gm IV q24 hr Pharmacy will sign off Protocol and monitor renal function  Thank you,  Otho Bellows PharmD Pager [phone] 02/25/2013, 8:00 AM

## 2013-02-25 NOTE — Progress Notes (Signed)
Patient ID: Kayla [name], female   DOB: Dec 25, 1950, 62 y.o.   MRN: [id number]  62 yo female with multiple medical co-morbidities with displaced right femoral neck fracture  Have reviewed her chart, medical history, current condition with the admitting Hospitalist team as well as with anesthesia for necessary right hip hemiarthroplasty  At this point will give her another day to further stabilize or improve on antibiotics  Benefit of hip repair with regards to pain control, mobilization important She will be high risk but recommendations provided in setting of operative repair  Ask that Dr. Malachi Bonds check back with her today to check on overall condition to provide further insight on operative stability  Regular diet today NPO after midnight Consent ordered for right hip hemiarthroplasty

## 2013-02-25 NOTE — Progress Notes (Signed)
Patient has signs of worsening sepsis, including poor uop, increasing fevers, decreasing BP, increasing o2 requirement.   -  Escalate to vancomycin and zosyn to cover MRSA and aspiration pneumonia -  Increase rate of IVF -  Check CMP, CBC, procalcitonin, lactic acid -  Patient has PIV for access -  Currently on venti 60% -  Patient should not go to surgery until symptoms are improving

## 2013-02-25 NOTE — Progress Notes (Signed)
ANTIBIOTIC CONSULT NOTE - INITIAL  Pharmacy Consult for vancomycin, Zosyn Indication: PNA, possible aspiration PNA  Allergies  Allergen Reactions  . Methylprednisolone Other (See Comments)    Pt states cannot take any steroids b/c they cause a decrease in her heart rate.  . Oxycodone-Acetaminophen     REACTION: unspecified  . Oxycodone-Acetaminophen     Rash head to toe  . Percocet [Oxycodone-Acetaminophen]     hallucinations  . Phenytoin     REACTION: rash  . Zanaflex [Tizanidine Hydrochloride]   . Phenytoin Sodium Extended Rash    Patient Measurements: Height: 5\' 3"  (160 cm) Weight: 141 lb 12.1 oz (64.3 kg) IBW/kg (Calculated) : 52.4  Vital Signs: Temp: 101.8 F (38.8 C) (12/05 1600) Temp src: Axillary (12/05 1600) BP: 109/48 mmHg (12/05 1600) Intake/Output from previous day: 12/04 0701 - 12/05 0700 In: 1185 [P.O.:60; I.V.:1125] Out: 735 [Urine:735] Intake/Output from this shift: Total I/O In: 1024 [P.O.:355; I.V.:619; IV Piggyback:50] Out: 240 [Urine:240]  Labs:  Recent Labs  02/23/13 2045 02/24/13 0516 02/24/13 1748 02/25/13 0400  WBC 7.9 9.9  --  9.2  HGB 11.3* 10.6*  --  10.0*  PLT 287 239  --  218  CREATININE 0.70 0.65 0.67 0.62   Estimated Creatinine Clearance: 67.5 ml/min (by C-G formula based on Cr of 0.62). No results found for this basename: Rolm Gala, VANCORANDOM, GENTTROUGH, GENTPEAK, GENTRANDOM, TOBRATROUGH, TOBRAPEAK, TOBRARND, AMIKACINPEAK, AMIKACINTROU, AMIKACIN,  in the last 72 hours   Microbiology: Recent Results (from the past 720 hour(s))  URINE CULTURE     Status: None   Collection Time    02/23/13 10:24 PM      Result Value Range Status   Specimen Description URINE, CATHETERIZED   Final   Special Requests NONE   Final   Culture  Setup Time     Final   Value: 02/24/2013 05:03     Performed at [facility] Count     Final   Value: 30,000 COLONIES/ML     Performed at [facility]   Culture      Final   Value: GRAM NEGATIVE RODS     Performed at [facility]   Report Status PENDING   Incomplete  MRSA PCR SCREENING     Status: None   Collection Time    02/24/13  7:05 PM      Result Value Range Status   MRSA by PCR NEGATIVE  NEGATIVE Final   Comment:            The GeneXpert MRSA Assay (FDA     approved for NASAL specimens     only), is one component of a     comprehensive MRSA colonization     surveillance program. It is not     intended to diagnose MRSA     infection nor to guide or     monitor treatment for     MRSA infections.    Medical History: Past Medical History  Diagnosis Date  . Multiple sclerosis   . Unspecified hypothyroidism   . Other voice and resonance disorders   . Other diseases of vocal cords   . Cervical cancer 2005    Medications:  Scheduled:  . ipratropium  0.5 mg Nebulization Q6H WA   And  . albuterol  2.5 mg Nebulization Q6H WA  . dalfampridine  10 mg Oral BID  . docusate sodium  100 mg Oral BID  . enoxaparin (LOVENOX) injection  40  mg Subcutaneous Q24H  . fesoterodine  4 mg Oral Daily  . FLUoxetine  40 mg Oral Daily  . gabapentin  600 mg Oral BID  . levothyroxine  75 mcg Oral Daily  . morphine   Intravenous Q4H  . pantoprazole  40 mg Oral Daily  . polyethylene glycol  17 g Oral Daily  . senna  2 tablet Oral QHS  . vitamin B-12  1,000 mcg Oral Daily   Infusions:  . sodium chloride 75 mL/hr at 02/24/13 0044   Assessment: 62 yo female admitted after fall with R hip fracture on 12/3 developed hypoxia and transferred to ICU on 12/4. Patient developed fever and rapid progression of PNA and possible aspiration PNA per Md so changing abx's to vancomycin and Zosyn from Zithromax and Rocephin  Goal of Therapy:  Vancomycin trough level 15-20 mcg/ml  Plan:  1) Vancomycin 750mg  IV q12 2) Zosyn 3.375g IV q8 (extended interval infusion)   Hessie Knows, PharmD, BCPS Pager [phone] 02/25/2013 4:56 PM

## 2013-02-25 NOTE — Progress Notes (Signed)
[facility] PROGRESS NOTE  Kayla [name]:[id number] DOB: June 11, 1950 DOA: 02/23/2013 PCP: Sandrea Hughs, MD  Assessment/Plan  Right hip fracture:  Severe pain of the right hip not adequately controlled with morphine -  Appreciate orthopedics assistance -  Okay for surgery.  Please refer to recommendations by ENT for smaller tube 5 or 6 and racemic epi as needed -  DVT prophylaxis per orthopedics -  Of note, patient did not tolerate anesthesia well during her hysterectomy -  Continue PCA  Fever, -  Repeat UA  -  CXR report pending but appears to have some left-sided infiltrates  -  S. pneumo legionella Ags -  Blood cultures x 2 -  Flu PCR -  Droplet precautions pending flu PCR -  Ceftriaxone, azithromycin, and tamiflu if flu PCR positive  Acute hypoxic respiratory failure.  Stridor baseline.  Continue venturi mask with humidity -  Stepdown with continuous pulse oximetry -  Continue duonebs -  Defer CTa -  Appreciate ENT assistance -  Pro-BNP  COPD without exacerbation -  Continue dulera and nebulizer treatments  Metabolic acidosis suggested by ABG -  BMP and lactic acid do not corroberate ABG findings  MS, may have exacerbation causing elevation of hemidiaphragm and weakness - continue Dalfampridine/baclofen  - followed by Trudi Ida in [place], [place] -  Consider MRI and steroids   OP, defer to outpatient management   Normocytic anemia, likely anemia of chronic disease  -  f/u Iron panel, folate, B12 -  Defer TSH due to acute illness  Hyponatremia, may be due to dehydration as improving with IVF.  Mild and asymptomatic -  Repeat in AM  Mild hyperglycemia, likely stress-related from fracture -  A1c with AM labs  Diet:  Regular diet then NPO at MN Access:  PIV IVF:  yes Proph:  SCDs for now  Code Status: full Family Communication: spoke with patient and her daughter Disposition Plan: continue  stepdown   Consultants:  Orthopedics  ENT  PCCM  Procedures:  XR femur, hip  CXR  Antibiotics:  Ceftriaxone 12/5 >>  Azithromycin 12/5 >>  HPI/Subjective:  States she has severe pain in the right leg.  Breathing is not very [name].  Getting constipated and feels thirsty.  Spiked fever overnight.  Denies nausea, cough, sinus congestion  Objective: Filed Vitals:   02/25/13 0300 02/25/13 0405 02/25/13 0500 02/25/13 0600  BP: 105/52  97/46 97/49  Pulse:      Temp:  98 F (36.7 C)    TempSrc:  Oral    Resp: 11 11 8 12   Height:      Weight:  64.3 kg (141 lb 12.1 oz)    SpO2: 99% 96% 96% 93%    Intake/Output Summary (Last 24 hours) at 02/25/13 0735 Last data filed at 02/25/13 0300  Gross per 24 hour  Intake    885 ml  Output    735 ml  Net    150 ml   Filed Weights   02/23/13 2002 02/25/13 0405  Weight: 58.514 kg (129 lb) 64.3 kg (141 lb 12.1 oz)    Exam:   General:  CF, baseline stridor, no acute distress   HEENT:  NCAT, MMM  Cardiovascular:  RRR, nl S1, S2 no mrg, 2+ pulses, warm extremities  Respiratory:  Soft stridor obscuring other resp sounds, but no obvious wheeze or rales or rhonchi.  No increased WOB  Abdomen:   NABS, soft, NT/ND  MSK:   Normal tone and bulk,  Right leg externally rotated no pain with palpation of lateral hip  2+ pulse, < 2 sec CR, pain with movement  Neuro:  Grossly intact  Psych:  A&O x 4  Data Reviewed: Basic Metabolic Panel:  Recent Labs Lab 02/23/13 2045 02/24/13 0516 02/24/13 1748 02/25/13 0400  NA 132* 133* 136 134*  K 4.5 4.1 4.1 3.8  CL 97 102 104 103  CO2 25 20 21 20   GLUCOSE 101* 117* 101* 88  BUN 16 16 15 17   CREATININE 0.70 0.65 0.67 0.62  CALCIUM 9.6 8.6 9.0 8.7   Liver Function Tests: No results found for this basename: AST, ALT, ALKPHOS, BILITOT, PROT, ALBUMIN,  in the last 168 hours No results found for this basename: LIPASE, AMYLASE,  in the last 168 hours No results found for this  basename: AMMONIA,  in the last 168 hours CBC:  Recent Labs Lab 02/23/13 2045 02/24/13 0516 02/25/13 0400  WBC 7.9 9.9 9.2  NEUTROABS 5.3  --   --   HGB 11.3* 10.6* 10.0*  HCT 33.2* 32.0* 30.3*  MCV 86.2 86.7 88.3  PLT 287 239 218   Cardiac Enzymes: No results found for this basename: CKTOTAL, CKMB, CKMBINDEX, TROPONINI,  in the last 168 hours BNP (last 3 results) No results found for this basename: PROBNP,  in the last 8760 hours CBG: No results found for this basename: GLUCAP,  in the last 168 hours  Recent Results (from the past 240 hour(s))  URINE CULTURE     Status: None   Collection Time    02/23/13 10:24 PM      Result Value Range Status   Specimen Description URINE, CATHETERIZED   Final   Special Requests NONE   Final   Culture  Setup Time     Final   Value: 02/24/2013 05:03     Performed at [facility] Count     Final   Value: 30,000 COLONIES/ML     Performed at [facility]   Culture     Final   Value: GRAM NEGATIVE RODS     Performed at [facility]   Report Status PENDING   Incomplete  MRSA PCR SCREENING     Status: None   Collection Time    02/24/13  7:05 PM      Result Value Range Status   MRSA by PCR NEGATIVE  NEGATIVE Final   Comment:            The GeneXpert MRSA Assay (FDA     approved for NASAL specimens     only), is one component of a     comprehensive MRSA colonization     surveillance program. It is not     intended to diagnose MRSA     infection nor to guide or     monitor treatment for     MRSA infections.     Studies: Dg Hip Complete Right  02/23/2013   CLINICAL DATA:  Right hip pain post fall.  EXAM: RIGHT HIP - COMPLETE 2+ VIEW  COMPARISON:  CT 11/17/2008  FINDINGS: Exam demonstrates mild symmetric degenerative change of the hips. There is a minimally displaced subcapital right femoral neck fracture. Cannot exclude a subtle nondisplaced fracture along the left side of the symphysis pubis joint.  There multiple surgical clips over the pelvic sidewalls. There are multiple soft tissue calcifications.  IMPRESSION: Displaced right subcapital femoral neck fracture. Cannot exclude a nondisplaced fracture along the left  side of the symphysis pubis joint.   Electronically Signed   By: Elberta Fortis M.D.   On: 02/23/2013 21:55   Dg Femur Right  02/23/2013   CLINICAL DATA:  Fall with right hip pain.  EXAM: RIGHT FEMUR - 2 VIEW  COMPARISON:  11/17/2008 CT  FINDINGS: There is a probable femoral neck fracture with shortening, but overlying gluteal calcification obscures detail.  There is no evidence of subluxation or dislocation.  No focal bony lesions are present.  IMPRESSION: Probable femoral neck fracture with shortening. Consider dedicated imaging of the hip secondary to overlying gluteal calcifications.   Electronically Signed   By: Laveda Abbe M.D.   On: 02/23/2013 21:13   Dg Chest Port 1 View  02/24/2013   CLINICAL DATA:  Preop for hip surgery, shortness of breath  EXAM: PORTABLE CHEST - 1 VIEW  COMPARISON:  09/23/2012  FINDINGS: The heart size and vascular pattern are normal. Mild elevation of the right diaphragm. Suboptimal inspiratory effect. No consolidation or effusion.  IMPRESSION: Suboptimal inspiratory effect with elevated right diaphragm. No acute infiltrate or effusion.   Electronically Signed   By: Esperanza Heir M.D.   On: 02/24/2013 16:21    Scheduled Meds: . ipratropium  0.5 mg Nebulization Q6H WA   And  . albuterol  2.5 mg Nebulization Q6H WA  . dalfampridine  10 mg Oral BID  . docusate sodium  100 mg Oral BID  . enoxaparin (LOVENOX) injection  40 mg Subcutaneous Q24H  . fesoterodine  4 mg Oral Daily  . FLUoxetine  40 mg Oral Daily  . gabapentin  600 mg Oral BID  . levothyroxine  75 mcg Oral Daily  . mometasone-formoterol  2 puff Inhalation BID  . morphine   Intravenous Q4H  . pantoprazole  40 mg Oral Daily  . polyethylene glycol  17 g Oral Daily  . senna  2 tablet Oral QHS    Continuous Infusions: . sodium chloride 75 mL/hr at 02/24/13 0044    Active Problems:   HYPOTHYROIDISM   Multiple sclerosis   Fractured femoral neck   Hip fracture   Acute respiratory failure with hypoxia   Pain    Time spent: 30 min    Susy Placzek, [facility]  [facility] Pager [phone]. If 7PM-7AM, please contact night-coverage at [email or web address], password [facility] 02/25/2013, 7:35 AM  LOS: 2 days

## 2013-02-25 NOTE — Consult Note (Addendum)
PULMONARY  / CRITICAL CARE MEDICINE  Name: Kayla [name] MRN: [id number] DOB: [birth year]    ADMISSION DATE:  02/23/2013 CONSULTATION DATE:  12/04  REFERRING MD :  Malachi Bonds PRIMARY SERVICE: [facility]  REASON FOR CONSULTATION:  Dyspnea, hypoxemia  BRIEF PATIENT DESCRIPTION:  62 yo female with Rt hip fx after fall 12/03.  Developed hypoxia and stridor 12/04 and transfer to ICU.  Has hx of vocal cord paresis.  She is followed by Dr. Sandrea Hughs for primary care.  SIGNIFICANT EVENTS:  12/03 admitted with R hip fx 12/04 Trf to ICU/SDU for increasing dyspnea and hypoxemia, ENT consulted 12/05 Fever 101.1  STUDIES: 12/04 Laryngoscopy >> true vocal fold paresis bilaterally with just 1 to 2 mm of abduction with inspiration bilaterally, narrow ~1mm glottic airway anteriorly, Posteriorly there is a ~4-71mm glottic airway, especially with inspiration.  LINES / TUBES:   CULTURES: Urine 12/03 >> GNR Blood 12/05 >>   ANTIBIOTICS: Rocephin 12/05 >> Zithromax 12/05 >>   SUBJECTIVE:  Breathing better.  Denies chest pain.    VITAL SIGNS: Temp:  [98 F (36.7 C)-101.1 F (38.4 C)] 98 F (36.7 C) (12/05 0405) Pulse Rate:  [86-90] 86 (12/04 2000) Resp:  [8-21] 11 (12/05 0800) BP: (94-125)/(40-71) 108/44 mmHg (12/05 0800) SpO2:  [88 %-99 %] 93 % (12/05 0800) FiO2 (%):  [35 %-60 %] 40 % (12/05 0816) Weight:  [141 lb 12.1 oz (64.3 kg)] 141 lb 12.1 oz (64.3 kg) (12/05 0405) 40% FiO2  INTAKE / OUTPUT: Intake/Output     12/04 0701 - 12/05 0700 12/05 0701 - 12/06 0700   P.O. 60    I.V. (mL/kg) 825 (12.8)    Total Intake(mL/kg) 885 (13.8)    Urine (mL/kg/hr) 735 (0.5)    Total Output 735     Net +150            PHYSICAL EXAMINATION: General: Pleasant Neuro: Sleepy, wakes up easily, follows commands HEENT: audible faint stridor Cardiovascular: regular Lungs: no wheeze Abdomen: soft, non tender Ext: no edema  LABS: CBC Recent Labs     02/23/13  2045  02/24/13  0516  02/25/13  0400   WBC  7.9  9.9  9.2  HGB  11.3*  10.6*  10.0*  HCT  33.2*  32.0*  30.3*  PLT  287  239  218    BMET Recent Labs     02/24/13  0516  02/24/13  1748  02/25/13  0400  NA  133*  136  134*  K  4.1  4.1  3.8  CL  102  104  103  CO2  20  21  20   BUN  16  15  17   CREATININE  0.65  0.67  0.62  GLUCOSE  117*  101*  88    Electrolytes Recent Labs     02/24/13  0516  02/24/13  1748  02/25/13  0400  CALCIUM  8.6  9.0  8.7   ABG Recent Labs     02/24/13  1625  PHART  7.306*  PCO2ART  41.6  PO2ART  54.8*    Imaging Dg Hip Complete Right  02/23/2013   CLINICAL DATA:  Right hip pain post fall.  EXAM: RIGHT HIP - COMPLETE 2+ VIEW  COMPARISON:  CT 11/17/2008  FINDINGS: Exam demonstrates mild symmetric degenerative change of the hips. There is a minimally displaced subcapital right femoral neck fracture. Cannot exclude a subtle nondisplaced fracture along the left side of the symphysis pubis joint.  There multiple surgical clips over the pelvic sidewalls. There are multiple soft tissue calcifications.  IMPRESSION: Displaced right subcapital femoral neck fracture. Cannot exclude a nondisplaced fracture along the left side of the symphysis pubis joint.   Electronically Signed   By: Elberta Fortis M.D.   On: 02/23/2013 21:55   Dg Femur Right  02/23/2013   CLINICAL DATA:  Fall with right hip pain.  EXAM: RIGHT FEMUR - 2 VIEW  COMPARISON:  11/17/2008 CT  FINDINGS: There is a probable femoral neck fracture with shortening, but overlying gluteal calcification obscures detail.  There is no evidence of subluxation or dislocation.  No focal bony lesions are present.  IMPRESSION: Probable femoral neck fracture with shortening. Consider dedicated imaging of the hip secondary to overlying gluteal calcifications.   Electronically Signed   By: Laveda Abbe M.D.   On: 02/23/2013 21:13   Dg Chest Port 1 View  02/25/2013   CLINICAL DATA:  Respiratory failure  EXAM: PORTABLE CHEST - 1 VIEW  COMPARISON:  02/24/2013   FINDINGS: Artifact overlies the chest. Heart size is normal. There is worsening of pulmonary infiltrate bilaterally, more on the right than the left. Pattern probably reflects pneumonia, though acute edema is not completely excluded. No effusions.  IMPRESSION: Worsening bilateral airspace opacity most consistent with worsening pneumonia.   Electronically Signed   By: Paulina Fusi M.D.   On: 02/25/2013 07:57   Dg Chest Port 1 View  02/24/2013   CLINICAL DATA:  Preop for hip surgery, shortness of breath  EXAM: PORTABLE CHEST - 1 VIEW  COMPARISON:  09/23/2012  FINDINGS: The heart size and vascular pattern are normal. Mild elevation of the right diaphragm. Suboptimal inspiratory effect. No consolidation or effusion.  IMPRESSION: Suboptimal inspiratory effect with elevated right diaphragm. No acute infiltrate or effusion.   Electronically Signed   By: Esperanza Heir M.D.   On: 02/24/2013 16:21       ASSESSMENT / PLAN:  PULMONARY A:  Acute hypoxic resp failure 2nd to stridor and ATX vs PNA (seems less likely).  May also have component of hypoventilation from narcotic medications. Hx of true vocal cord paresis. Hx of asthma. Hx of OSA. P:   -f/u CXR -bronchial hygiene -oxygen to keep SpO2 > 92% -will need f/u with ENT >> they have left recommendations regarding ETT size to be used when she has surgery; will likely need to remain on vent post-op -hold dulera in setting of possible pneumonia -continue BD's -CPAP qhs  CARDIOVASCULAR A:  No Issues. P:  -Monitor hemodynamics  RENAL A:   No issues. P:   -monitor renal fx, urine outpt, electrolytes  GASTROINTESTINAL A:   Hx of GERD. P:   -use protonix in place of dexilant while in hospital  HEMATOLOGIC A:   Anemia of chronic disease. P:  -f/u CBC -lovenox for DVT prevention  INFECTIOUS A:  New fever 12/05 with GNR in urine cx and ? Infiltrate on CXR >> ATX vs PNA. P:   -Day 1 rocephin, zithromax  ENDOCRINE A:   Hx of  hypothyroidism. P:   -Continue synthroid  NEUROLOGIC A:   Hx of multiple sclerosis. P:   -continue prozc, neurontin  ORTHOPEDIC A: Rt hip fx. P: -tentative plan for hip surgery 12/06 >> may need to defer until infectious process better controlled  Updated family at bedside.  Coralyn Helling, MD [facility] Pulmonary/Critical Care 02/25/2013, 9:19 AM Pager:  [phone] After 3pm call: [phone]

## 2013-02-26 ENCOUNTER — Inpatient Hospital Stay (HOSPITAL_COMMUNITY): Payer: 59

## 2013-02-26 DIAGNOSIS — J189 Pneumonia, unspecified organism: Secondary | ICD-10-CM

## 2013-02-26 DIAGNOSIS — K5900 Constipation, unspecified: Secondary | ICD-10-CM

## 2013-02-26 DIAGNOSIS — I5031 Acute diastolic (congestive) heart failure: Secondary | ICD-10-CM

## 2013-02-26 DIAGNOSIS — J3800 Paralysis of vocal cords and larynx, unspecified: Secondary | ICD-10-CM

## 2013-02-26 DIAGNOSIS — I369 Nonrheumatic tricuspid valve disorder, unspecified: Secondary | ICD-10-CM

## 2013-02-26 LAB — BASIC METABOLIC PANEL
BUN: 16 mg/dL (ref 6–23)
CO2: 19 mEq/L (ref 19–32)
Calcium: 8.8 mg/dL (ref 8.4–10.5)
Chloride: 99 mEq/L (ref 96–112)
Creatinine, Ser: 0.49 mg/dL — ABNORMAL LOW (ref 0.50–1.10)
GFR calc Af Amer: >90 mL/min (ref >90)
GFR calc non Af Amer: >90 mL/min (ref >90)
Glucose, Bld: 130 mg/dL — ABNORMAL HIGH (ref 70–99)
Potassium: 3.4 mEq/L — ABNORMAL LOW (ref 3.5–5.1)
Sodium: 133 mEq/L — ABNORMAL LOW (ref 135–145)

## 2013-02-26 LAB — URINE CULTURE: Colony Count: 30000

## 2013-02-26 LAB — BLOOD GAS, ARTERIAL
Acid-Base Excess: 0.4 mmol/L (ref 0.0–2.0)
Bicarbonate: 24.1 mEq/L — ABNORMAL HIGH (ref 20.0–24.0)
Drawn by: 31814
FIO2: 0.8 %
O2 Saturation: 81.7 %
Patient temperature: 97.1
TCO2: 22.3 mmol/L (ref 0–100)
pCO2 arterial: 35.9 mmHg (ref 35.0–45.0)
pH, Arterial: 7.437 (ref 7.350–7.450)
pO2, Arterial: 41.9 mmHg — ABNORMAL LOW (ref 80.0–100.0)

## 2013-02-26 LAB — CBC
HCT: 32.7 % — ABNORMAL LOW (ref 36.0–46.0)
Hemoglobin: 11 g/dL — ABNORMAL LOW (ref 12.0–15.0)
MCH: 29.3 pg (ref 26.0–34.0)
MCHC: 33.6 g/dL (ref 30.0–36.0)
MCV: 87 fL (ref 78.0–100.0)
Platelets: 198 10*3/uL (ref 150–400)
RBC: 3.76 MIL/uL — ABNORMAL LOW (ref 3.87–5.11)
RDW: 13.7 % (ref 11.5–15.5)
WBC: 11.8 10*3/uL — ABNORMAL HIGH (ref 4.0–10.5)

## 2013-02-26 MED ORDER — BISACODYL 10 MG RE SUPP
10.0000 mg | Freq: Once | RECTAL | Status: AC
  Administered 2013-02-26: 10 mg via RECTAL
  Filled 2013-02-26: qty 1

## 2013-02-26 MED ORDER — ALUM & MAG HYDROXIDE-SIMETH 200-200-20 MG/5ML PO SUSP: 30.0000 mL | ORAL | Status: DC | PRN

## 2013-02-26 MED ORDER — FUROSEMIDE 10 MG/ML IJ SOLN
40.0000 mg | Freq: Two times a day (BID) | INTRAMUSCULAR | Status: AC
  Administered 2013-02-26 – 2013-02-27 (×2): 40 mg via INTRAVENOUS
  Filled 2013-02-26 (×2): qty 4

## 2013-02-26 MED ORDER — MAGNESIUM CITRATE PO SOLN
1.0000 | Freq: Once | ORAL | Status: AC
  Administered 2013-02-26: 1 via ORAL
  Filled 2013-02-26: qty 296

## 2013-02-26 MED ORDER — SODIUM CHLORIDE 0.9 % IV SOLN
INTRAVENOUS | Status: AC
  Administered 2013-02-26: 20:00:00 via INTRAVENOUS

## 2013-02-26 MED ORDER — POTASSIUM CHLORIDE 10 MEQ/100ML IV SOLN
10.0000 meq | INTRAVENOUS | Status: AC
  Administered 2013-02-26 (×2): 10 meq via INTRAVENOUS
  Filled 2013-02-26: qty 100

## 2013-02-26 MED ORDER — DEXTROSE 5 % IV SOLN
500.0000 mg | INTRAVENOUS | Status: DC
  Administered 2013-02-26: 500 mg via INTRAVENOUS
  Filled 2013-02-26 (×2): qty 500

## 2013-02-26 MED ORDER — PANTOPRAZOLE SODIUM 40 MG PO TBEC
40.0000 mg | DELAYED_RELEASE_TABLET | Freq: Two times a day (BID) | ORAL | Status: DC
  Administered 2013-02-26 – 2013-02-28 (×3): 40 mg via ORAL
  Filled 2013-02-26 (×10): qty 1

## 2013-02-26 MED ORDER — POTASSIUM CHLORIDE 10 MEQ/100ML IV SOLN
INTRAVENOUS | Status: AC
  Filled 2013-02-26: qty 100

## 2013-02-26 NOTE — Progress Notes (Signed)
[facility] PROGRESS NOTE  KALANI BARAY ZOX:[id number] DOB: 1950/12/19 DOA: 02/23/2013 PCP: Sandrea Hughs, MD  Assessment/Plan  Right hip fracture:  Severe pain of the right hip improved with morphine PCA -  Appreciate orthopedics assistance -  Please refer to recommendations by ENT for smaller tube 5 or 6 and racemic epi as needed -  DVT prophylaxis per orthopedics -  Of note, patient did not tolerate anesthesia well during her hysterectomy -  Continue PCA  Atypical pneumonia, bilateral, with fever.  Unusual that procalcitonin is negative, however.  Consider other inflammatory or hemorrhagic pulmonary disease? -  Repeat UA with 11-20 WBC and few bacteria and culture grew a few colonies of pseudomonas -  S. pneumo neg and legionella neg -  MRSA nasal swab neg -  Blood cultures NGTD -  Flu PCR neg -  Continue vancomycin, zosyn, and azithromycin for now pending blood cultures results.    Acute hypoxic respiratory failure.  Stridor baseline.  Wean venturi as tolerated -  Stepdown with continuous pulse oximetry  -  Continue duonebs -  Defer CTa -  Appreciate ENT assistance -  Pro-BNP elevated -  ECHO to eval EF  Hypotension and decreased uop.  Lactic acid neg.  Improved with increased hydration overnight -  Decreased IVF back to 85ml/h  COPD without exacerbation -  Continue dulera and nebulizer treatments  MS, stable.   - continue Dalfampridine/baclofen  - followed by Trudi Ida in [place], [place]  OP, defer to outpatient management   Normocytic anemia, likely anemia of chronic disease  -  Iron panel c/w chronic disease, folate wnl, B12 low normal -  Start daily vitamin B12 -  Defer TSH due to acute illness  Hyponatremia, may be due to dehydration as improving with IVF.  Mild and asymptomatic -  Repeat in AM  Mild hyperglycemia, likely stress-related from fracture.  a1c wnl  Constipation -  Continue colace, senna, and daily miralax -  Add magnesium citrate  and bisacodyl suppository -  If those haven't worked by this afternoon, will try enema and/or methylnatrexone  Diet:  Regular diet Access:  PIV IVF:  yes Proph:  SCDs for now  Code Status: full Family Communication: spoke with patient and her daughter and husband Disposition Plan: continue stepdown   Consultants:  Orthopedics  ENT  PCCM  Procedures:  XR femur, hip  CXR  Antibiotics:  Ceftriaxone 12/5 >> 12/5  Azithromycin 12/5 >>  vanco 12/5 >>  Zosyn 12/5 >>  HPI/Subjective:  Fevers again trending down, uop and BP much improved.  Had some heart burn and abdominal pain overnight.  No BM in 5-6 days  Objective: Filed Vitals:   02/26/13 0300 02/26/13 0400 02/26/13 0600 02/26/13 0743  BP: 152/95 159/76 159/76   Pulse:      Temp:  97.4 F (36.3 C)    TempSrc:  Oral    Resp: 15 16 13 17   Height:      Weight:  61.1 kg (134 lb 11.2 oz)    SpO2: 92% 93% 91% 95%    Intake/Output Summary (Last 24 hours) at 02/26/13 0756 Last data filed at 02/26/13 0600  Gross per 24 hour  Intake 2952.97 ml  Output    840 ml  Net 2112.97 ml   Filed Weights   02/23/13 2002 02/25/13 0405 02/26/13 0400  Weight: 58.514 kg (129 lb) 64.3 kg (141 lb 12.1 oz) 61.1 kg (134 lb 11.2 oz)    Exam:   General:  CF, baseline stridor, no acute distress, face mask in place  HEENT:  NCAT, MMM  Cardiovascular:  RRR, nl S1, S2 no mrg, 2+ pulses, warm extremities  Respiratory:  Soft stridor obscuring other resp sounds, possible rales bilaterally but difficult to hear over stridor and face mask, no obvious rhonchi or wheeze.  No increased WOB  Abdomen:   NABS, soft, NT/ND, full feeling  MSK:   Normal tone and bulk, Right leg externally rotated no pain with palpation of lateral hip  2+ pulse, < 2 sec CR, pain with movement  Neuro:  Grossly intact  Psych:  A&O x 4  Data Reviewed: Basic Metabolic Panel:  Recent Labs Lab 02/24/13 0516 02/24/13 1748 02/25/13 0400 02/25/13 1717  02/26/13 0340  NA 133* 136 134* 133* 133*  K 4.1 4.1 3.8 3.8 3.4*  CL 102 104 103 100 99  CO2 20 21 20 20 19   GLUCOSE 117* 101* 88 84 130*  BUN 16 15 17 17 16   CREATININE 0.65 0.67 0.62 0.60 0.49*  CALCIUM 8.6 9.0 8.7 8.8 8.8   Liver Function Tests:  Recent Labs Lab 02/25/13 1717  AST 42*  ALT 18  ALKPHOS 62  BILITOT 0.3  PROT 5.8*  ALBUMIN 2.9*   No results found for this basename: LIPASE, AMYLASE,  in the last 168 hours No results found for this basename: AMMONIA,  in the last 168 hours CBC:  Recent Labs Lab 02/23/13 2045 02/24/13 0516 02/25/13 0400 02/25/13 1717 02/26/13 0340  WBC 7.9 9.9 9.2 10.6* 11.8*  NEUTROABS 5.3  --   --  8.5*  --   HGB 11.3* 10.6* 10.0* 10.0* 11.0*  HCT 33.2* 32.0* 30.3* 30.1* 32.7*  MCV 86.2 86.7 88.3 88.0 87.0  PLT 287 239 218 200 198   Cardiac Enzymes: No results found for this basename: CKTOTAL, CKMB, CKMBINDEX, TROPONINI,  in the last 168 hours BNP (last 3 results)  Recent Labs  02/25/13 0835  PROBNP 1654.0*   CBG: No results found for this basename: GLUCAP,  in the last 168 hours  Recent Results (from the past 240 hour(s))  URINE CULTURE     Status: None   Collection Time    02/23/13 10:24 PM      Result Value Range Status   Specimen Description URINE, CATHETERIZED   Final   Special Requests NONE   Final   Culture  Setup Time     Final   Value: 02/24/2013 05:03     Performed at [facility] Count     Final   Value: 30,000 COLONIES/ML     Performed at [facility]   Culture     Final   Value: PSEUDOMONAS AERUGINOSA     Performed at [facility]   Report Status 02/26/2013 FINAL   Final   Organism ID, Bacteria PSEUDOMONAS AERUGINOSA   Final  MRSA PCR SCREENING     Status: None   Collection Time    02/24/13  7:05 PM      Result Value Range Status   MRSA by PCR NEGATIVE  NEGATIVE Final   Comment:            The GeneXpert MRSA Assay (FDA     approved for NASAL specimens      only), is one component of a     comprehensive MRSA colonization     surveillance program. It is not     intended to diagnose MRSA  infection nor to guide or     monitor treatment for     MRSA infections.  CULTURE, BLOOD (ROUTINE X 2)     Status: None   Collection Time    02/25/13  8:35 AM      Result Value Range Status   Specimen Description BLOOD RIGHT ARM   Final   Special Requests BOTTLES DRAWN AEROBIC ONLY 5CC   Final   Culture  Setup Time     Final   Value: 02/25/2013 10:18     Performed at [facility]   Culture     Final   Value:        BLOOD CULTURE RECEIVED NO GROWTH TO DATE CULTURE WILL BE HELD FOR 5 DAYS BEFORE ISSUING A FINAL NEGATIVE REPORT     Performed at [facility]   Report Status PENDING   Incomplete  CULTURE, BLOOD (ROUTINE X 2)     Status: None   Collection Time    02/25/13  8:46 AM      Result Value Range Status   Specimen Description BLOOD RIGHT HAND   Final   Special Requests BOTTLES DRAWN AEROBIC AND ANAEROBIC 5CC   Final   Culture  Setup Time     Final   Value: 02/25/2013 10:18     Performed at [facility]   Culture     Final   Value:        BLOOD CULTURE RECEIVED NO GROWTH TO DATE CULTURE WILL BE HELD FOR 5 DAYS BEFORE ISSUING A FINAL NEGATIVE REPORT     Performed at [facility]   Report Status PENDING   Incomplete     Studies: Dg Chest Port 1 View  02/26/2013   CLINICAL DATA:  Follow-up pulmonary infiltrates, history of cervical cancer  EXAM: PORTABLE CHEST - 1 VIEW  COMPARISON:  Prior chest x-ray 02/25/2013  FINDINGS: Stable cardiac and mediastinal contours. Slightly increased bilateral patchy airspace disease in the bilateral upper and lower lobes. There may be a trace right pleural effusion. No pneumothorax. Background bronchitic changes. No acute osseous abnormality.  IMPRESSION: 1. Slight interval increase in bilateral patchy airspace infiltrates throughout both upper and lower lobes. Findings raise concern  for multifocal or atypical pneumonia. Asymmetric pulmonary edema is a significantly less likely consideration.   Electronically Signed   By: Malachy Moan M.D.   On: 02/26/2013 07:48   Dg Chest Port 1 View  02/25/2013   CLINICAL DATA:  Respiratory failure  EXAM: PORTABLE CHEST - 1 VIEW  COMPARISON:  02/24/2013  FINDINGS: Artifact overlies the chest. Heart size is normal. There is worsening of pulmonary infiltrate bilaterally, more on the right than the left. Pattern probably reflects pneumonia, though acute edema is not completely excluded. No effusions.  IMPRESSION: Worsening bilateral airspace opacity most consistent with worsening pneumonia.   Electronically Signed   By: Paulina Fusi M.D.   On: 02/25/2013 07:57   Dg Chest Port 1 View  02/24/2013   CLINICAL DATA:  Preop for hip surgery, shortness of breath  EXAM: PORTABLE CHEST - 1 VIEW  COMPARISON:  09/23/2012  FINDINGS: The heart size and vascular pattern are normal. Mild elevation of the right diaphragm. Suboptimal inspiratory effect. No consolidation or effusion.  IMPRESSION: Suboptimal inspiratory effect with elevated right diaphragm. No acute infiltrate or effusion.   Electronically Signed   By: Esperanza Heir M.D.   On: 02/24/2013 16:21    Scheduled Meds: . ipratropium  0.5 mg Nebulization  Q6H WA   And  . albuterol  2.5 mg Nebulization Q6H WA  . azithromycin  500 mg Intravenous Q24H  . dalfampridine  10 mg Oral BID  . docusate sodium  100 mg Oral BID  . enoxaparin (LOVENOX) injection  40 mg Subcutaneous Q24H  . fesoterodine  4 mg Oral Daily  . FLUoxetine  40 mg Oral Daily  . gabapentin  600 mg Oral BID  . levothyroxine  75 mcg Oral Daily  . morphine   Intravenous Q4H  . pantoprazole  40 mg Oral Daily  . piperacillin-tazobactam (ZOSYN)  IV  3.375 g Intravenous Q8H  . polyethylene glycol  17 g Oral Daily  . senna  2 tablet Oral QHS  . vancomycin  750 mg Intravenous Q12H  . vitamin B-12  1,000 mcg Oral Daily   Continuous  Infusions: . sodium chloride 75 mL/hr at 02/26/13 0709    Active Problems:   HYPOTHYROIDISM   Multiple sclerosis   Fractured femoral neck   Hip fracture   Acute respiratory failure with hypoxia   Pain    Time spent: 30 min    Keaunna Skipper, [facility]  [facility] Pager [phone]. If 7PM-7AM, please contact night-coverage at [email or web address], password [facility] 02/26/2013, 7:56 AM  LOS: 3 days

## 2013-02-26 NOTE — Progress Notes (Addendum)
eLink Physician-Brief Progress Note Patient Name: Kayla [name] DOB: 13-Mar-1951 MRN: [id number]  Date of Service  02/26/2013   HPI/Events of Note   Worsening hypoxemia on aerosol mask 80%.  ABG 7.44/36/42  eICU Interventions  Initiate bipap.  Patient on cpap at home.        Henry Russel, P 02/26/2013, 8:06 PM

## 2013-02-26 NOTE — Plan of Care (Signed)
Problem: Phase II Progression Outcomes Goal: O2 sats > equal to 90% on RA or at baseline Outcome: Not Progressing Pt currently wearing 60% Aerosol face mask with 02 sat's ranging between 84 and 94%.  Pt desaturates to 83% when turned and when mask is removed for meds, then slowly recovers, taking a few minutes.  Respiratory rate remains approx 14-18 breaths per minute, no distress noted.  Pt needs to be encouraged to cough and deep breath. Goal: Pain controlled on oral analgesia Outcome: Progressing Morphine Sulphate PCA effective in managing pain. Goal: ADLs completed with minimal assistance Outcome: Progressing Pt does not initiate any self care or movement in bed. Passive ROM performed as tolerated. Goal: Activity at appropriate level-compared to baseline (UP IN CHAIR FOR HEMODIALYSIS) Outcome: Not Progressing Pt has low reserve and desaturates easily with activity. Goal: Tolerating diet Outcome: Not Progressing Poor appetite.  Taking sips and small bites of food (yogart). Goal: Discharge plan remains appropriate-arrangements made Outcome: Not Progressing Waiting on clearance to have hip surgery.

## 2013-02-26 NOTE — Progress Notes (Signed)
Elink Dr. Katrinka Blazing notified of pt Kayla [name] results and worsening respiratory status. Pt husband at bedside and updated.  RT placing pt on Bipap.  Cont to monitor.

## 2013-02-26 NOTE — Progress Notes (Signed)
PULMONARY  / CRITICAL CARE MEDICINE  Name: Kayla [name] MRN: [id number] DOB: [birth year]    ADMISSION DATE:  02/23/2013 CONSULTATION DATE:  02/24/2013  REFERRING MD :  [facility] PRIMARY SERVICE: [facility]  REASON FOR CONSULTATION:  Dyspnea, hypoxemia  BRIEF PATIENT DESCRIPTION:  62 yo female with history of vocal cord paresis (Dr. Sherene Sires) admitted 12/03 with R hip fracture. Developed hypoxia and stridor 12/04 and transfer to ICU.  SIGNIFICANT EVENTS / STUDIES:  12/03  Admitted with R hip fx 12/04  Transferred to ICU with stridor / hypoxemia, ENT consulted 12/04  Laryngoscopy >>>  Bilateral vocal fold paresis bilaterally 12/06  TTE >>> EF 60, diastolic dysfunction, PAP 67 torr  LINES / TUBES:  CULTURES: 12/03  Urine >>> Pseudomonas (pansensetive) 12/05  Blood >>>   ANTIBIOTICS: Rocephin 12/05 x 1 Zithromax 12/05 >>>  Vancomycin 12/05 >>> Zosyn 12/05 >>>  SUBJECTIVE: Complains of hip pain  VITAL SIGNS: Temp:  [97.4 F (36.3 C)-101.8 F (38.8 C)] 97.4 F (36.3 C) (12/06 0400) Resp:  [10-19] 17 (12/06 0743) BP: (99-159)/(41-95) 159/76 mmHg (12/06 0600) SpO2:  [87 %-96 %] 95 % (12/06 0817) FiO2 (%):  [60 %] 60 % (12/06 0817) Weight:  [61.1 kg (134 lb 11.2 oz)] 61.1 kg (134 lb 11.2 oz) (12/06 0400) 40% FiO2  INTAKE / OUTPUT: Intake/Output     12/05 0701 - 12/06 0700 12/06 0701 - 12/07 0700   P.O. 355    I.V. (mL/kg) 2423 (39.7) 84 (1.4)   IV Piggyback 450    Total Intake(mL/kg) 3228 (52.8) 84 (1.4)   Urine (mL/kg/hr) 840 (0.6)    Total Output 840     Net +2388 +84          PHYSICAL EXAMINATION: General: Pleasant Neuro: Sleepy, wakes up easily, follows commands HEENT: audible faint stridor Cardiovascular: regular Lungs: + upper airway wheeze, basilar rales  Abdomen: soft, non tender Ext: no edema  LABS: CBC  Recent Labs Lab 02/25/13 0400 02/25/13 1717 02/26/13 0340  WBC 9.2 10.6* 11.8*  HGB 10.0* 10.0* 11.0*  HCT 30.3* 30.1* 32.7*  PLT 218 200 198    Coag's No results found for this basename: APTT, INR,  in the last 168 hours BMET  Recent Labs Lab 02/25/13 0400 02/25/13 1717 02/26/13 0340  NA 134* 133* 133*  K 3.8 3.8 3.4*  CL 103 100 99  CO2 20 20 19   BUN 17 17 16   CREATININE 0.62 0.60 0.49*  GLUCOSE 88 84 130*   Electrolytes  Recent Labs Lab 02/25/13 0400 02/25/13 1717 02/26/13 0340  CALCIUM 8.7 8.8 8.8   Sepsis Markers  Recent Labs Lab 02/24/13 1748 02/25/13 1717  LATICACIDVEN 1.3 0.7  PROCALCITON  --  <0.10   ABG  Recent Labs Lab 02/24/13 1625  PHART 7.306*  PCO2ART 41.6  PO2ART 54.8*   Liver Enzymes  Recent Labs Lab 02/25/13 1717  AST 42*  ALT 18  ALKPHOS 62  BILITOT 0.3  ALBUMIN 2.9*   Cardiac Enzymes  Recent Labs Lab 02/25/13 0835  PROBNP 1654.0*   Glucose No results found for this basename: GLUCAP,  in the last 168 hours  ASSESSMENT / PLAN:  PULMONARY A:  Acute hypoxemic respiratory failure in setting of HCAP.  Less likely acute pulmonary edema.  Vocal cord paralysis.  History of reactive airway disease, but no acute bronchospasm.  OSA.  Pulmonary hypertension (PAP 67 torr). P:   Goal SpO2>92 Supplemental oxygen PRN Likely poor NIMV candidate Hold dulera in setting of possible  pneumonia Albuterol PRN CPAP qHS  CARDIOVASCULAR A:  Acute on chronic diastolic dysfunction. P:  Follow TTE results  RENAL A:  Mild hypokalemia. P:   Trend BNP D/c IVF K 10 x 2 Lasix 40 q12h x 2 doses, goal negative 1000 mL in 24 hours  GASTROINTESTINAL A:  GERD. P:   Protonix ( preadmission PPI)  HEMATOLOGIC A:  Anemia of chronic disease. P:  Trend CBC Lovenox for DVT prevention  INFECTIOUS A: Pseudomonas UTI. Possible HCAP. PCT (<0.1) reassuring. P:   Abx / cultures as above  ENDOCRINE A:   Hx of hypothyroidism. P:   Synthroid  NEUROLOGIC A:  Hx of multiple sclerosis.  Pain. P:   PCA Prozac, Restoril, Baclofen, Neurontin  ORTHOPEDIC A:  R hip  fracture. P: Awaiting surgery, would hold until pulmonary issues resolve  I have personally obtained history, examined patient, evaluated and interpreted laboratory and imaging results, reviewed medical records, formulated assessment / plan and placed orders.  CRITICAL CARE:  The patient is critically ill with multiple organ systems failure and requires high complexity decision making for assessment and support, frequent evaluation and titration of therapies, application of advanced monitoring technologies and extensive interpretation of multiple databases. Critical Care Time devoted to patient care services described in this note is 40 minutes.   Lonia Farber, MD Pulmonary and Critical Care Medicine [facility] Pager: [phone]  02/26/2013, 2:12 PM

## 2013-02-26 NOTE — Progress Notes (Signed)
  Echocardiogram 2D Echocardiogram has been performed.  Arvil Chaco 02/26/2013, 10:28 AM

## 2013-02-26 NOTE — Progress Notes (Signed)
Pt. in need of multiple IV access sites. Now has 3 IV sites. Pt very difficult stick. Suggest PICC line or CVC. Gilman Schmidt, RN IV Team

## 2013-02-27 ENCOUNTER — Inpatient Hospital Stay (HOSPITAL_COMMUNITY): Payer: 59

## 2013-02-27 LAB — BASIC METABOLIC PANEL
BUN: 16 mg/dL (ref 6–23)
CO2: 22 mEq/L (ref 19–32)
Calcium: 8.8 mg/dL (ref 8.4–10.5)
Chloride: 96 mEq/L (ref 96–112)
Creatinine, Ser: 0.58 mg/dL (ref 0.50–1.10)
GFR calc Af Amer: >90 mL/min (ref >90)
GFR calc non Af Amer: >90 mL/min (ref >90)
Glucose, Bld: 115 mg/dL — ABNORMAL HIGH (ref 70–99)
Potassium: 3.4 mEq/L — ABNORMAL LOW (ref 3.5–5.1)
Sodium: 131 mEq/L — ABNORMAL LOW (ref 135–145)

## 2013-02-27 LAB — PROCALCITONIN: Procalcitonin: 0.14 ng/mL

## 2013-02-27 LAB — VANCOMYCIN, TROUGH: Vancomycin Tr: 8.6 ug/mL — ABNORMAL LOW (ref 10.0–20.0)

## 2013-02-27 LAB — CBC
HCT: 29.6 % — ABNORMAL LOW (ref 36.0–46.0)
Hemoglobin: 10 g/dL — ABNORMAL LOW (ref 12.0–15.0)
MCH: 28.8 pg (ref 26.0–34.0)
MCHC: 33.8 g/dL (ref 30.0–36.0)
MCV: 85.3 fL (ref 78.0–100.0)
Platelets: 224 10*3/uL (ref 150–400)
RBC: 3.47 MIL/uL — ABNORMAL LOW (ref 3.87–5.11)
RDW: 13.5 % (ref 11.5–15.5)
WBC: 10.7 10*3/uL — ABNORMAL HIGH (ref 4.0–10.5)

## 2013-02-27 MED ORDER — FLEET ENEMA 7-19 GM/118ML RE ENEM: 1.0000 | ENEMA | Freq: Once | RECTAL | Status: DC

## 2013-02-27 MED ORDER — LEVOFLOXACIN IN D5W 750 MG/150ML IV SOLN
750.0000 mg | INTRAVENOUS | Status: DC
  Administered 2013-02-27 – 2013-03-07 (×9): 750 mg via INTRAVENOUS
  Filled 2013-02-27 (×10): qty 150

## 2013-02-27 MED ORDER — DEXTROSE 5 % IV SOLN
1.0000 g | Freq: Three times a day (TID) | INTRAVENOUS | Status: DC
  Administered 2013-02-27 – 2013-03-01 (×6): 1 g via INTRAVENOUS
  Filled 2013-02-27 (×8): qty 1

## 2013-02-27 MED ORDER — SODIUM CHLORIDE 0.9 % IV SOLN
1000.0000 mg | Freq: Every day | INTRAVENOUS | Status: AC
  Administered 2013-02-27 – 2013-03-01 (×3): 1000 mg via INTRAVENOUS
  Filled 2013-02-27 (×3): qty 8

## 2013-02-27 MED ORDER — FUROSEMIDE 10 MG/ML IJ SOLN
40.0000 mg | Freq: Two times a day (BID) | INTRAMUSCULAR | Status: DC
  Administered 2013-02-27 – 2013-03-01 (×6): 40 mg via INTRAVENOUS
  Filled 2013-02-27 (×9): qty 4

## 2013-02-27 MED ORDER — SODIUM CHLORIDE 0.9 % IJ SOLN
10.0000 mL | Freq: Two times a day (BID) | INTRAMUSCULAR | Status: DC
  Administered 2013-02-27: 10 mL
  Administered 2013-02-28: 20 mL
  Administered 2013-03-01: 10 mL
  Administered 2013-03-01: 20 mL
  Administered 2013-03-02 – 2013-03-08 (×10): 10 mL
  Administered 2013-03-08: 20 mL

## 2013-02-27 MED ORDER — SODIUM CHLORIDE 0.9 % IJ SOLN
10.0000 mL | INTRAMUSCULAR | Status: DC | PRN
  Administered 2013-03-03: 10 mL

## 2013-02-27 MED ORDER — POTASSIUM CHLORIDE 10 MEQ/100ML IV SOLN
10.0000 meq | INTRAVENOUS | Status: AC
  Administered 2013-02-27 (×6): 10 meq via INTRAVENOUS
  Filled 2013-02-27 (×7): qty 100

## 2013-02-27 MED ORDER — VANCOMYCIN HCL IN DEXTROSE 750-5 MG/150ML-% IV SOLN
750.0000 mg | Freq: Three times a day (TID) | INTRAVENOUS | Status: DC
  Administered 2013-02-28 – 2013-03-01 (×4): 750 mg via INTRAVENOUS
  Filled 2013-02-27 (×5): qty 150

## 2013-02-27 NOTE — Progress Notes (Signed)
ANTIBIOTIC CONSULT NOTE  Pharmacy Consult for cefepime, levofloxacin, vancomycin Indication: HCAP with atypical features  Allergies  Allergen Reactions  . Methylprednisolone Other (See Comments)    Pt states cannot take any steroids b/c they cause a decrease in her heart rate.  . Oxycodone-Acetaminophen     REACTION: unspecified  . Oxycodone-Acetaminophen     Rash head to toe  . Percocet [Oxycodone-Acetaminophen]     hallucinations  . Phenytoin     REACTION: rash  . Zanaflex [Tizanidine Hydrochloride]   . Phenytoin Sodium Extended Rash    Patient Measurements: Height: 5\' 3"  (160 cm) Weight: 134 lb 11.2 oz (61.1 kg) IBW/kg (Calculated) : 52.4   Vital Signs: Temp: 101 F (38.3 C) (12/07 0400) Temp src: Axillary (12/07 0400) BP: 118/66 mmHg (12/07 0700) Pulse Rate: 94 (12/07 0342) Intake/Output from previous day: 12/06 0701 - 12/07 0700 In: 1684 [I.V.:729; IV Piggyback:850] Out: 3890 [Urine:3890] Intake/Output from this shift:    Labs:  Recent Labs  02/25/13 1717 02/26/13 0340 02/27/13 0350  WBC 10.6* 11.8* 10.7*  HGB 10.0* 11.0* 10.0*  PLT 200 198 224  CREATININE 0.60 0.49* 0.58   Estimated Creatinine Clearance: 61.9 ml/min (by C-G formula based on Cr of 0.58).    Microbiology: 12/3 Urine: 30K P.aeruginosa, S to all antipseudomonal agents tested 12/5 Blood x2:  NGtd 12/5 Urine: 100K GNRs, ID and S pending  12/5: Negative for S.pneumo and Legionella antigens (urine test)   Medical History: Past Medical History  Diagnosis Date  . Multiple sclerosis   . Unspecified hypothyroidism   . Other voice and resonance disorders   . Other diseases of vocal cords   . Cervical cancer 2005    Medications:  Scheduled:  . ipratropium  0.5 mg Nebulization Q6H WA   And  . albuterol  2.5 mg Nebulization Q6H WA  . dalfampridine  10 mg Oral BID  . docusate sodium  100 mg Oral BID  . enoxaparin (LOVENOX) injection  40 mg Subcutaneous Q24H  . fesoterodine  4 mg  Oral Daily  . FLUoxetine  40 mg Oral Daily  . gabapentin  600 mg Oral BID  . levothyroxine  75 mcg Oral Daily  . morphine   Intravenous Q4H  . pantoprazole  40 mg Oral BID AC  . polyethylene glycol  17 g Oral Daily  . senna  2 tablet Oral QHS  . vancomycin  750 mg Intravenous Q12H  . vitamin B-12  1,000 mcg Oral Daily   Infusions:   PRN: acetaminophen, albuterol, alum & mag hydroxide-simeth, baclofen, calcium carbonate, diphenhydrAMINE, diphenhydrAMINE, naloxone, ondansetron (ZOFRAN) IV, Racepinephrine HCl, sodium chloride, temazepam  Assessment: 62 y/o F with multiple sclerosis and vocal cord paresis, on home CPAP, followed by Dr. Sherene Sires.  She was admitted 12/3 after falling and fracturing R hip.  On 12/4 she developed dyspnea and hypoxemia; hip fracture repair was deferred until medically stable.  On 12/5 empiric antibiotics (vancomycin, Zosyn, azithromycin) were started for HCAP.  12/7: D#3 vancomycin 750 mg IV q12h / Zosyn 3.375 g IV q8h (extended infusion) / azithromycin 500 mg IV q24h for  HCAP.  Last night developed worsening hypoxia and CXR demonstrated progressive diffuse bilateral pneumonia.  Orders received this AM to discontinue Zosyn/azithromycin and begin cefepime and levofloxacin with pharmacy to dose.  Continuing vancomycin.  Goal of Therapy:  Eradication of infection Optimal dosing of antimicrobials Vancomycin trough 15-20  Plan:  1. Cefepime 1 gram IV q8h 2. Levofloxacin 750 mg IV q24h 3. Continue  vancomycin 750 mg IV q12h 4. Check vancomycin trough today at 1700 hr 5. Follow serum creatinine, culture data, clinical course.  Elie Goody, PharmD, BCPS Pager: [phone] 02/27/2013  7:46 AM

## 2013-02-27 NOTE — Progress Notes (Signed)
[facility] PROGRESS NOTE  SAMUELLA RASOOL [name]:[id number] DOB: 07/22/1950 DOA: 02/23/2013 PCP: Sandrea Hughs, MD  Assessment/Plan  Acute hypoxic respiratory failure with worsening hypoxia, now on bipap overnight  -  Transfer to PCCM service until respiratory status is stable  -  Continue duonebs -  Defer CTa -  Appreciate ENT assistance -  Pro-BNP elevated -  ECHO:  Preserved EF, but suggestion of diastolic heart failure -  Diuresed 2 L yesterday   Right hip fracture:  Severe pain of the right hip improved with morphine PCA -  Appreciate orthopedics assistance -  Please refer to recommendations by ENT for smaller tube 5 or 6 and racemic epi as needed -  DVT prophylaxis per orthopedics -  Of note, patient did not tolerate anesthesia well during her hysterectomy -  Continue PCA  Atypical pneumonia, bilateral, with persistent fevers and increased oxygen requirement.  Unusual that procalcitonin is negative, however.  Consider other inflammatory or hemorrhagic pulmonary disease? -  Repeat UA with 11-20 WBC and few bacteria and culture grew 30K of pseudomonas, pansensitive but not tested against zosyn.   -  Transition zoysn to cefepime  -  S. pneumo neg and legionella neg -  MRSA nasal swab neg -  Blood cultures NGTD -  Flu PCR neg -  Continue vancomycin  -    Change azithromycin to levofloxacin  Hypotension post diuresis yesterday and appeared hypovolemic -  Restarted IVF back at 47ml/h overnight, but will d/c and defer to PCCM  COPD without exacerbation -  Continue dulera and nebulizer treatments  MS, stable.   - continue Dalfampridine/baclofen  - followed by Trudi Ida in [place], [place]  OP, defer to outpatient management   Normocytic anemia, likely anemia of chronic disease  -  Iron panel c/w chronic disease, folate wnl, B12 low normal -  Start daily vitamin B12 -  Defer TSH due to acute illness  Hyponatremia, may be due to dehydration as improving with IVF.   Mild and asymptomatic -  Repeat in AM  Mild hyperglycemia, likely stress-related from fracture.  a1c wnl  Constipation -  Continue colace, senna, and daily miralax -  Add magnesium citrate and bisacodyl suppository -  If those haven't worked by this afternoon, will try enema and/or methylnatrexone  Diet:  NPO >> consider enteric nutrition Access:  PIV >> consider PICC line placement IVF:  yes Proph:  SCDs for now  Code Status: full Family Communication: spoke with patient and her daughter  Disposition Plan: continue ICU.  Transfer to PCCM service for now   Consultants:  Orthopedics  ENT  PCCM  Procedures:  XR femur, hip  CXR  Antibiotics:  Ceftriaxone 12/5 >> 12/5  Azithromycin 12/5 >> 12/7  vanco 12/5 >>  Zosyn 12/5 >> 12/7   Cefepime 12/7 >>  Levofloxacin 12/7 >>  HPI/Subjective:  Fevers up again.  Confused.  Now on bipap due to worsening hypoxia overnight  Objective: Filed Vitals:   02/27/13 0400 02/27/13 0500 02/27/13 0600 02/27/13 0700  BP: 125/71 113/57 128/65 118/66  Pulse:      Temp: 101 F (38.3 C)     TempSrc: Axillary     Resp: 25 25 23 21   Height:      Weight:      SpO2:        Intake/Output Summary (Last 24 hours) at 02/27/13 0711 Last data filed at 02/27/13 0539  Gross per 24 hour  Intake   1609 ml  Output  3890 ml  Net  -2281 ml   Filed Weights   02/23/13 2002 02/25/13 0405 02/26/13 0400  Weight: 58.514 kg (129 lb) 64.3 kg (141 lb 12.1 oz) 61.1 kg (134 lb 11.2 oz)    Exam:   General:  CF, baseline stridor, no acute distress, bipap  HEENT:  NCAT, MMM  Cardiovascular:  RRR, nl S1, S2 no mrg, 2+ pulses, warm extremities  Respiratory:  Diminished bilateral BS, no rhonchi or obvious wheeze. Anteriorly, no rales.No increased WOB  Abdomen:   NABS, soft, NT/ND, full feeling  MSK:   Normal tone and bulk, Right leg externally rotated no pain with palpation of lateral hip  2+ pulse, < 2 sec CR, pain with movement.   Diffuse 1+ pitting edema.  Neuro:  Grossly intact  Psych:  Asleep but arouseable and confused  Data Reviewed: Basic Metabolic Panel:  Recent Labs Lab 02/24/13 1748 02/25/13 0400 02/25/13 1717 02/26/13 0340 02/27/13 0350  NA 136 134* 133* 133* 131*  K 4.1 3.8 3.8 3.4* 3.4*  CL 104 103 100 99 96  CO2 21 20 20 19 22   GLUCOSE 101* 88 84 130* 115*  BUN 15 17 17 16 16   CREATININE 0.67 0.62 0.60 0.49* 0.58  CALCIUM 9.0 8.7 8.8 8.8 8.8   Liver Function Tests:  Recent Labs Lab 02/25/13 1717  AST 42*  ALT 18  ALKPHOS 62  BILITOT 0.3  PROT 5.8*  ALBUMIN 2.9*   No results found for this basename: LIPASE, AMYLASE,  in the last 168 hours No results found for this basename: AMMONIA,  in the last 168 hours CBC:  Recent Labs Lab 02/23/13 2045 02/24/13 0516 02/25/13 0400 02/25/13 1717 02/26/13 0340 02/27/13 0350  WBC 7.9 9.9 9.2 10.6* 11.8* 10.7*  NEUTROABS 5.3  --   --  8.5*  --   --   HGB 11.3* 10.6* 10.0* 10.0* 11.0* 10.0*  HCT 33.2* 32.0* 30.3* 30.1* 32.7* 29.6*  MCV 86.2 86.7 88.3 88.0 87.0 85.3  PLT 287 239 218 200 198 224   Cardiac Enzymes: No results found for this basename: CKTOTAL, CKMB, CKMBINDEX, TROPONINI,  in the last 168 hours BNP (last 3 results)  Recent Labs  02/25/13 0835  PROBNP 1654.0*   CBG: No results found for this basename: GLUCAP,  in the last 168 hours  Recent Results (from the past 240 hour(s))  URINE CULTURE     Status: None   Collection Time    02/23/13 10:24 PM      Result Value Range Status   Specimen Description URINE, CATHETERIZED   Final   Special Requests NONE   Final   Culture  Setup Time     Final   Value: 02/24/2013 05:03     Performed at [facility] Count     Final   Value: 30,000 COLONIES/ML     Performed at [facility]   Culture     Final   Value: PSEUDOMONAS AERUGINOSA     Performed at [facility]   Report Status 02/26/2013 FINAL   Final   Organism ID, Bacteria PSEUDOMONAS  AERUGINOSA   Final  MRSA PCR SCREENING     Status: None   Collection Time    02/24/13  7:05 PM      Result Value Range Status   MRSA by PCR NEGATIVE  NEGATIVE Final   Comment:            The GeneXpert MRSA Assay (FDA  approved for NASAL specimens     only), is one component of a     comprehensive MRSA colonization     surveillance program. It is not     intended to diagnose MRSA     infection nor to guide or     monitor treatment for     MRSA infections.  CULTURE, BLOOD (ROUTINE X 2)     Status: None   Collection Time    02/25/13  8:35 AM      Result Value Range Status   Specimen Description BLOOD RIGHT ARM   Final   Special Requests BOTTLES DRAWN AEROBIC ONLY 5CC   Final   Culture  Setup Time     Final   Value: 02/25/2013 10:18     Performed at [facility]   Culture     Final   Value:        BLOOD CULTURE RECEIVED NO GROWTH TO DATE CULTURE WILL BE HELD FOR 5 DAYS BEFORE ISSUING A FINAL NEGATIVE REPORT     Performed at [facility]   Report Status PENDING   Incomplete  CULTURE, BLOOD (ROUTINE X 2)     Status: None   Collection Time    02/25/13  8:46 AM      Result Value Range Status   Specimen Description BLOOD RIGHT HAND   Final   Special Requests BOTTLES DRAWN AEROBIC AND ANAEROBIC 5CC   Final   Culture  Setup Time     Final   Value: 02/25/2013 10:18     Performed at [facility]   Culture     Final   Value:        BLOOD CULTURE RECEIVED NO GROWTH TO DATE CULTURE WILL BE HELD FOR 5 DAYS BEFORE ISSUING A FINAL NEGATIVE REPORT     Performed at [facility]   Report Status PENDING   Incomplete     Studies: Dg Chest Port 1 View  02/26/2013   CLINICAL DATA:  Hypoxia.  Respiratory distress.  EXAM: PORTABLE CHEST - 1 VIEW  COMPARISON:  Earlier today.  FINDINGS: Stable normal sized heart. Progressive patchy airspace opacity in both lungs. No pleural fluid. Unremarkable bones.  IMPRESSION: Mildly progressive diffuse bilateral pneumonia.    Electronically Signed   By: Gordan Payment M.D.   On: 02/26/2013 21:44   Dg Chest Port 1 View  02/26/2013   CLINICAL DATA:  Follow-up pulmonary infiltrates, history of cervical cancer  EXAM: PORTABLE CHEST - 1 VIEW  COMPARISON:  Prior chest x-ray 02/25/2013  FINDINGS: Stable cardiac and mediastinal contours. Slightly increased bilateral patchy airspace disease in the bilateral upper and lower lobes. There may be a trace right pleural effusion. No pneumothorax. Background bronchitic changes. No acute osseous abnormality.  IMPRESSION: 1. Slight interval increase in bilateral patchy airspace infiltrates throughout both upper and lower lobes. Findings raise concern for multifocal or atypical pneumonia. Asymmetric pulmonary edema is a significantly less likely consideration.   Electronically Signed   By: Malachy Moan M.D.   On: 02/26/2013 07:48    Scheduled Meds: . ipratropium  0.5 mg Nebulization Q6H WA   And  . albuterol  2.5 mg Nebulization Q6H WA  . azithromycin  500 mg Intravenous Q24H  . dalfampridine  10 mg Oral BID  . docusate sodium  100 mg Oral BID  . enoxaparin (LOVENOX) injection  40 mg Subcutaneous Q24H  . fesoterodine  4 mg Oral Daily  . FLUoxetine  40 mg  Oral Daily  . gabapentin  600 mg Oral BID  . levothyroxine  75 mcg Oral Daily  . morphine   Intravenous Q4H  . pantoprazole  40 mg Oral BID AC  . piperacillin-tazobactam (ZOSYN)  IV  3.375 g Intravenous Q8H  . polyethylene glycol  17 g Oral Daily  . senna  2 tablet Oral QHS  . vancomycin  750 mg Intravenous Q12H  . vitamin B-12  1,000 mcg Oral Daily   Continuous Infusions:    Active Problems:   HYPOTHYROIDISM   Multiple sclerosis   Fractured femoral neck   Hip fracture   Acute respiratory failure with hypoxia   Pain   CAP (community acquired pneumonia)   Unspecified constipation   HCAP (healthcare-associated pneumonia)   Acute diastolic heart failure   Vocal cord paralysis    Time spent: 30 min    Belen Pesch,  [facility]  [facility] Pager [phone]. If 7PM-7AM, please contact night-coverage at [email or web address], password [facility] 02/27/2013, 7:11 AM  LOS: 4 days

## 2013-02-27 NOTE — Consult Note (Signed)
NEURO HOSPITALIST CONSULT NOTE    Reason for Consult: worsening weakness, possible MS exacerbation and need for IV steroids.  HPI:                                                                                                                                          Kayla [name] is an 62 y.o. 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  Spoke with her husband that tells me that last MS relapse was 7 years ago and her last MRI brain did not showed active MS disease. Although she has foot droop, she is reportedly very functional and her MS has not affected her cognitively.  Before admission to the hospital they contacted her MS specialist due to increased fatigue and weakness lower and upper extremities and a steroid dose pack was reccommended, as she doesn't do well with IV steroids and oral treatment has proved quite beneficial in the past.  During this admission she was found to have acute hypoxemic respiratory failure in the setting of HCAP versus aspiration pneumonia, and pseudomonas UTI. Currently on morphine pump for right hip pain. Just had a right IJ line placed. A neurological consultation was requested to address the need for IV steroids.  Past Medical History  Diagnosis Date  . Multiple sclerosis   . Unspecified hypothyroidism   . Other voice and resonance disorders   . Other diseases of vocal cords   . Cervical cancer 2005    Past Surgical History  Procedure Laterality Date  . Vesicovaginal fistula closure w/ tah      Family History  Problem Relation Age of Onset  . Colon cancer Father     Social History:  reports that she has never smoked. She has never used smokeless tobacco. She reports that she drinks alcohol. She reports that she  does not use illicit drugs.  Allergies  Allergen Reactions  . Methylprednisolone Other (See Comments)    Pt states cannot take any steroids b/c they cause a decrease in her heart rate.  . Oxycodone-Acetaminophen     REACTION: unspecified  . Oxycodone-Acetaminophen     Rash head to toe  . Percocet [Oxycodone-Acetaminophen]     hallucinations  . Phenytoin     REACTION: rash  . Zanaflex [Tizanidine Hydrochloride]   . Phenytoin Sodium Extended Rash    MEDICATIONS:  I have reviewed the patient's current medications.   ROS: unable to obtain at this moment due to patient's distress/pain.                                                                                                                                      History obtained from chart review and husband.  Physical exam: pleasant female in mild distress. Blood pressure 104/52, pulse 94, temperature 99.9 F (37.7 C), temperature source Axillary, resp. rate 27, height 5\' 3"  (1.6 m), weight 61.1 kg (134 lb 11.2 oz), SpO2 99.00%. Head: normocephalic. Neck: supple, no bruits, no JVD. Cardiac: no murmurs. Lungs: clear. Abdomen: soft, no tender, no mass. Extremities: no edema.  Neurologic Examination:                                                                                                      Neuro-exam is very limited at this moment due to sedation and procedure just completed few minutes ago. Mental status: alert, awake, oriented. Comprehension, naming, repetition intact. Follows commands appropriately but then becomes drowsy and inattentive. CN 2-12: pupils 4 mm bilaterally, reactive to light. No gaze preference. EOM full without nystagmus. Face symmetric. Tongue central. Motor: she moves her left leg greater than right LE. Pain, discomfort from recent procedure limits a reliable assesment of motor function  upper extremities, although she seems to have good grip bilaterally. Sensory: no tested. DTRs: 1 + upper extremities. Brisk knee jerk on the left, 1+ right.  Plantars: bilateral upgoing toes. Coordination and gait: unable to test.  Lab Results  Component Value Date/Time   CHOL 205* 06/25/2010 12:50 PM    Results for orders placed during the hospital encounter of 02/23/13 (from the past 48 hour(s))  LACTIC ACID, PLASMA     Status: None   Collection Time    02/25/13  5:17 PM      Result Value Range   Lactic Acid, Venous 0.7  0.5 - 2.2 mmol/L  COMPREHENSIVE METABOLIC PANEL     Status: Abnormal   Collection Time    02/25/13  5:17 PM      Result Value Range   Sodium 133 (*) 135 - 145 mEq/L   Potassium 3.8  3.5 - 5.1 mEq/L   Chloride 100  96 - 112 mEq/L   CO2 20  19 - 32 mEq/L   Glucose, Bld 84  70 - 99 mg/dL   BUN 17  6 - 23 mg/dL   Creatinine, Ser 1.61  0.50 - 1.10 mg/dL   Calcium 8.8  8.4 - 40.9 mg/dL   Total Protein 5.8 (*) 6.0 - 8.3 g/dL   Albumin 2.9 (*) 3.5 - 5.2 g/dL   AST 42 (*) 0 - 37 U/L   ALT 18  0 - 35 U/L   Alkaline Phosphatase 62  39 - 117 U/L   Total Bilirubin 0.3  0.3 - 1.2 mg/dL   GFR calc non Af Amer >90  >90 mL/min   GFR calc Af Amer >90  >90 mL/min   Comment: (NOTE)     The eGFR has been calculated using the CKD EPI equation.     This calculation has not been validated in all clinical situations.     eGFR's persistently <90 mL/min signify possible Chronic Kidney     Disease.  CBC WITH DIFFERENTIAL     Status: Abnormal   Collection Time    02/25/13  5:17 PM      Result Value Range   WBC 10.6 (*) 4.0 - 10.5 K/uL   RBC 3.42 (*) 3.87 - 5.11 MIL/uL   Hemoglobin 10.0 (*) 12.0 - 15.0 g/dL   HCT 81.1 (*) 91.4 - 78.2 %   MCV 88.0  78.0 - 100.0 fL   MCH 29.2  26.0 - 34.0 pg   MCHC 33.2  30.0 - 36.0 g/dL   RDW 95.6  21.3 - 08.6 %   Platelets 200  150 - 400 K/uL   Neutrophils Relative % 80 (*) 43 - 77 %   Neutro Abs 8.5 (*) 1.7 - 7.7 K/uL   Lymphocytes  Relative 7 (*) 12 - 46 %   Lymphs Abs 0.7  0.7 - 4.0 K/uL   Monocytes Relative 11  3 - 12 %   Monocytes Absolute 1.2 (*) 0.1 - 1.0 K/uL   Eosinophils Relative 2  0 - 5 %   Eosinophils Absolute 0.2  0.0 - 0.7 K/uL   Basophils Relative 0  0 - 1 %   Basophils Absolute 0.0  0.0 - 0.1 K/uL  PROCALCITONIN     Status: None   Collection Time    02/25/13  5:17 PM      Result Value Range   Procalcitonin <0.10     Comment:            Interpretation:     PCT (Procalcitonin) <= 0.5 ng/mL:     Systemic infection (sepsis) is not likely.     Local bacterial infection is possible.     (NOTE)             ICU PCT Algorithm               Non ICU PCT Algorithm        ----------------------------     ------------------------------             PCT < 0.25 ng/mL                 PCT < 0.1 ng/mL         Stopping of antibiotics            Stopping of antibiotics           strongly encouraged.               strongly encouraged.        ----------------------------     ------------------------------           PCT level decrease by  PCT < 0.25 ng/mL           >= 80% from peak PCT           OR PCT 0.25 - 0.5 ng/mL          Stopping of antibiotics                                                 encouraged.         Stopping of antibiotics               encouraged.        ----------------------------     ------------------------------           PCT level decrease by              PCT >= 0.25 ng/mL           < 80% from peak PCT            AND PCT >= 0.5 ng/mL            Continuing antibiotics                                                  encouraged.           Continuing antibiotics                encouraged.        ----------------------------     ------------------------------         PCT level increase compared          PCT > 0.5 ng/mL             with peak PCT AND              PCT >= 0.5 ng/mL             Escalation of antibiotics                                              strongly  encouraged.          Escalation of antibiotics            strongly encouraged.  BASIC METABOLIC PANEL     Status: Abnormal   Collection Time    02/26/13  3:40 AM      Result Value Range   Sodium 133 (*) 135 - 145 mEq/L   Potassium 3.4 (*) 3.5 - 5.1 mEq/L   Chloride 99  96 - 112 mEq/L   CO2 19  19 - 32 mEq/L   Glucose, Bld 130 (*) 70 - 99 mg/dL   BUN 16  6 - 23 mg/dL   Creatinine, Ser 1.61 (*) 0.50 - 1.10 mg/dL   Calcium 8.8  8.4 - 09.6 mg/dL   GFR calc non Af Amer >90  >90 mL/min   GFR calc Af Amer >90  >90 mL/min   Comment: (NOTE)     The eGFR has been calculated using the CKD EPI equation.     This calculation has not been validated in  all clinical situations.     eGFR's persistently <90 mL/min signify possible Chronic Kidney     Disease.  CBC     Status: Abnormal   Collection Time    02/26/13  3:40 AM      Result Value Range   WBC 11.8 (*) 4.0 - 10.5 K/uL   RBC 3.76 (*) 3.87 - 5.11 MIL/uL   Hemoglobin 11.0 (*) 12.0 - 15.0 g/dL   HCT 09.8 (*) 11.9 - 14.7 %   MCV 87.0  78.0 - 100.0 fL   MCH 29.3  26.0 - 34.0 pg   MCHC 33.6  30.0 - 36.0 g/dL   RDW 82.9  56.2 - 13.0 %   Platelets 198  150 - 400 K/uL  BLOOD GAS, ARTERIAL     Status: Abnormal   Collection Time    02/26/13  7:53 PM      Result Value Range   FIO2 0.80     Delivery systems AEROSOL MASK     pH, Arterial 7.437  7.350 - 7.450   pCO2 arterial 35.9  35.0 - 45.0 mmHg   pO2, Arterial 41.9 (*) 80.0 - 100.0 mmHg   Bicarbonate 24.1 (*) 20.0 - 24.0 mEq/L   TCO2 22.3  0 - 100 mmol/L   Acid-Base Excess 0.4  0.0 - 2.0 mmol/L   O2 Saturation 81.7     Patient temperature 97.1     Collection site RIGHT RADIAL     Drawn by [phone]     Sample type ARTERIAL     Allens test (pass/fail) PASS  PASS  BASIC METABOLIC PANEL     Status: Abnormal   Collection Time    02/27/13  3:50 AM      Result Value Range   Sodium 131 (*) 135 - 145 mEq/L   Potassium 3.4 (*) 3.5 - 5.1 mEq/L   Chloride 96  96 - 112 mEq/L   CO2 22  19 - 32  mEq/L   Glucose, Bld 115 (*) 70 - 99 mg/dL   BUN 16  6 - 23 mg/dL   Creatinine, Ser 4.69  0.50 - 1.10 mg/dL   Calcium 8.8  8.4 - 62.9 mg/dL   GFR calc non Af Amer >90  >90 mL/min   GFR calc Af Amer >90  >90 mL/min   Comment: (NOTE)     The eGFR has been calculated using the CKD EPI equation.     This calculation has not been validated in all clinical situations.     eGFR's persistently <90 mL/min signify possible Chronic Kidney     Disease.  CBC     Status: Abnormal   Collection Time    02/27/13  3:50 AM      Result Value Range   WBC 10.7 (*) 4.0 - 10.5 K/uL   RBC 3.47 (*) 3.87 - 5.11 MIL/uL   Hemoglobin 10.0 (*) 12.0 - 15.0 g/dL   HCT 52.8 (*) 41.3 - 24.4 %   MCV 85.3  78.0 - 100.0 fL   MCH 28.8  26.0 - 34.0 pg   MCHC 33.8  30.0 - 36.0 g/dL   RDW 01.0  27.2 - 53.6 %   Platelets 224  150 - 400 K/uL  PROCALCITONIN     Status: None   Collection Time    02/27/13  3:50 AM      Result Value Range   Procalcitonin 0.14     Comment:  Interpretation:     PCT (Procalcitonin) <= 0.5 ng/mL:     Systemic infection (sepsis) is not likely.     Local bacterial infection is possible.     (NOTE)             ICU PCT Algorithm               Non ICU PCT Algorithm        ----------------------------     ------------------------------             PCT < 0.25 ng/mL                 PCT < 0.1 ng/mL         Stopping of antibiotics            Stopping of antibiotics           strongly encouraged.               strongly encouraged.        ----------------------------     ------------------------------           PCT level decrease by               PCT < 0.25 ng/mL           >= 80% from peak PCT           OR PCT 0.25 - 0.5 ng/mL          Stopping of antibiotics                                                 encouraged.         Stopping of antibiotics               encouraged.        ----------------------------     ------------------------------           PCT level decrease by               PCT >= 0.25 ng/mL           < 80% from peak PCT            AND PCT >= 0.5 ng/mL            Continuing antibiotics                                                  encouraged.           Continuing antibiotics                encouraged.        ----------------------------     ------------------------------         PCT level increase compared          PCT > 0.5 ng/mL             with peak PCT AND              PCT >= 0.5 ng/mL             Escalation of antibiotics  strongly encouraged.          Escalation of antibiotics            strongly encouraged.    Dg Chest Port 1 View  02/27/2013   CLINICAL DATA:  Pneumonia. Shortness of breath.  EXAM: PORTABLE CHEST - 1 VIEW  COMPARISON:  02/26/2013 and 02/25/2013 and 02/24/2013  FINDINGS: Bilateral pulmonary infiltrates persist with slight improvement in the right mid and lower lung zones. Heart size and vascularity are normal. I think there is a small right pleural effusion.  IMPRESSION: Persistent bilateral pneumonia with slight clearing in the right lung.   Electronically Signed   By: Geanie Cooley M.D.   On: 02/27/2013 09:18   Dg Chest Port 1 View  02/26/2013   CLINICAL DATA:  Hypoxia.  Respiratory distress.  EXAM: PORTABLE CHEST - 1 VIEW  COMPARISON:  Earlier today.  FINDINGS: Stable normal sized heart. Progressive patchy airspace opacity in both lungs. No pleural fluid. Unremarkable bones.  IMPRESSION: Mildly progressive diffuse bilateral pneumonia.   Electronically Signed   By: Gordan Payment M.D.   On: 02/26/2013 21:44   Dg Chest Port 1 View  02/26/2013   CLINICAL DATA:  Follow-up pulmonary infiltrates, history of cervical cancer  EXAM: PORTABLE CHEST - 1 VIEW  COMPARISON:  Prior chest x-ray 02/25/2013  FINDINGS: Stable cardiac and mediastinal contours. Slightly increased bilateral patchy airspace disease in the bilateral upper and lower lobes. There may be a trace right pleural effusion. No pneumothorax.  Background bronchitic changes. No acute osseous abnormality.  IMPRESSION: 1. Slight interval increase in bilateral patchy airspace infiltrates throughout both upper and lower lobes. Findings raise concern for multifocal or atypical pneumonia. Asymmetric pulmonary edema is a significantly less likely consideration.   Electronically Signed   By: Malachy Moan M.D.   On: 02/26/2013 07:48        Assessment/Plan: 62 y/o with long standing h/o MS, JC virus positive on monthly tydabri infusion. Neuro-exam is very limited at this moment due to sedation and recent procedure and will need to reevaluate her later on. Pseudomonas UTI and probable HCAP makes the possibility of a pseudo MS relapse very likely. However, I will be in favor of a trial of IV solumedrol x 3 days to try to improve her respiratory status. Will follow up.  Wyatt Portela, MD 02/27/2013, 12:29 PM

## 2013-02-27 NOTE — Progress Notes (Signed)
Peripherally Inserted Central Catheter/Midline Placement  The IV Nurse has discussed with the patient and/or persons authorized to consent for the patient, the purpose of this procedure and the potential benefits and risks involved with this procedure.  The benefits include less needle sticks, lab draws from the catheter and patient may be discharged home with the catheter.  Risks include, but not limited to, infection, bleeding, blood clot (thrombus formation), and puncture of an artery; nerve damage and irregular heat beat.  Alternatives to this procedure were also discussed.  PICC/Midline Placement Documentation  PICC Triple Lumen 02/27/13 PICC Right Basilic 39 cm 0 cm (Active)  Indication for Insertion or Continuance of Line Vasoactive infusions 02/27/2013 12:00 PM  Exposed Catheter (cm) 0 cm 02/27/2013 12:00 PM  Site Assessment Clean;Dry;Intact 02/27/2013 12:00 PM  Lumen #1 Status Flushed;Saline locked;Blood return noted 02/27/2013 12:00 PM  Lumen #2 Status Flushed;Saline locked;Blood return noted 02/27/2013 12:00 PM  Lumen #3 Status Flushed;Saline locked;Blood return noted 02/27/2013 12:00 PM  Dressing Type Transparent 02/27/2013 12:00 PM  Dressing Status Clean;Dry;Intact;Antimicrobial disc in place 02/27/2013 12:00 PM  Dressing Intervention New dressing 02/27/2013 12:00 PM  Dressing Change Due 03/06/13 02/27/2013 12:00 PM       Ethelda Chick 02/27/2013, 12:33 PM

## 2013-02-27 NOTE — Progress Notes (Signed)
PULMONARY  / CRITICAL CARE MEDICINE  Name: Kayla [name] MRN: [id number] DOB: 1950/08/28    ADMISSION DATE:  02/23/2013 CONSULTATION DATE:  02/24/2013  REFERRING MD :  [facility] PRIMARY SERVICE: TRH  REASON FOR CONSULTATION:  Dyspnea, hypoxemia  BRIEF PATIENT DESCRIPTION:  62 yo female with history of vocal cord paresis (Dr. Sherene Sires) admitted 12/03 with R hip fracture. Developed hypoxia and stridor 12/04 and transfer to ICU.  SIGNIFICANT EVENTS / STUDIES:  12/03  Admitted with R hip fx 12/04  Transferred to ICU with stridor / hypoxemia, ENT consulted 12/04  Laryngoscopy >>>  Bilateral vocal fold paresis bilaterally 12/06  TTE >>> EF 60, diastolic dysfunction, PAP 67 torr 12/07  Neurology consulted   LINES / TUBES:  CULTURES: 12/03  Urine >>> Pseudomonas (pansensetive) 12/05  Urine >>> GNR>>> 12/05  Blood >>>   ANTIBIOTICS: Rocephin 12/05 x 1 Zithromax 12/05 >>>  Vancomycin 12/05 >>> Zosyn 12/05 >>>  SUBJECTIVE:  BiPAP last night due to respiratory distress  VITAL SIGNS: Temp:  [97.1 F (36.2 C)-101 F (38.3 C)] 99.9 F (37.7 C) (12/07 0800) Pulse Rate:  [94-102] 94 (12/07 0342) Resp:  [15-28] 20 (12/07 0800) BP: (98-152)/(49-73) 124/63 mmHg (12/07 0800) SpO2:  [86 %-100 %] 100 % (12/07 0805) FiO2 (%):  [50 %-80 %] 50 % (12/07 0805) 40% FiO2  INTAKE / OUTPUT: Intake/Output     12/06 0701 - 12/07 0700 12/07 0701 - 12/08 0700   P.O.     I.V. (mL/kg) 729 (11.9)    Other 105    IV Piggyback 850    Total Intake(mL/kg) 1684 (27.6)    Urine (mL/kg/hr) 3890 (2.7)    Total Output 3890     Net -2206            PHYSICAL EXAMINATION: General: Pleasant Neuro: Sleepy, slow to arouse, very weak seems out of proportion to situation  HEENT: audible faint stridor off BIPAP  Cardiovascular: regular Lungs: + upper airway wheeze, basilar rales  Abdomen: soft, non tender Ext: no edema  LABS: CBC  Recent Labs Lab 02/25/13 1717 02/26/13 0340 02/27/13 0350  WBC 10.6* 11.8*  10.7*  HGB 10.0* 11.0* 10.0*  HCT 30.1* 32.7* 29.6*  PLT 200 198 224   Coag's No results found for this basename: APTT, INR,  in the last 168 hours BMET  Recent Labs Lab 02/25/13 1717 02/26/13 0340 02/27/13 0350  NA 133* 133* 131*  K 3.8 3.4* 3.4*  CL 100 99 96  CO2 20 19 22   BUN 17 16 16   CREATININE 0.60 0.49* 0.58  GLUCOSE 84 130* 115*   Electrolytes  Recent Labs Lab 02/25/13 1717 02/26/13 0340 02/27/13 0350  CALCIUM 8.8 8.8 8.8   Sepsis Markers  Recent Labs Lab 02/24/13 1748 02/25/13 1717 02/27/13 0350  LATICACIDVEN 1.3 0.7  --   PROCALCITON  --  <0.10 0.14   ABG  Recent Labs Lab 02/24/13 1625 02/26/13 1953  PHART 7.306* 7.437  PCO2ART 41.6 35.9  PO2ART 54.8* 41.9*   Liver Enzymes  Recent Labs Lab 02/25/13 1717  AST 42*  ALT 18  ALKPHOS 62  BILITOT 0.3  ALBUMIN 2.9*   Cardiac Enzymes  Recent Labs Lab 02/25/13 0835  PROBNP 1654.0*   Glucose No results found for this basename: GLUCAP,  in the last 168 hours PCX:  12/07 >>> Bilateral patchy airspace disease persists.  ASSESSMENT / PLAN:  PULMONARY A:  Acute hypoxemic respiratory failure in setting of HCAP vs aspiration (more likely).  Less  likely acute pulmonary edema.  Vocal cord paralysis.  History of reactive airway disease, but no acute bronchospasm.  OSA.  Pulmonary hypertension (PAP 67 torr). P:   Goal SpO2>92 Supplemental oxygen via face tent PRN BIPAP PRN High risk for intubation, if required use small size ( 5-6 ) ETT Hold dulera in setting of possible pneumonia Albuterol PRN  CARDIOVASCULAR A:  Acute on chronic diastolic dysfunction. P:  Goal MAP> 60 Place PICC  RENAL A:  Hypokalemia. P:   Goal I/O net negative 1000 mL Trend BNP K 10 x 6 KVO Lasix 40 q12h  GASTROINTESTINAL A:  GERD. Constipation  P:   Protonix ( preadmission PPI) Need to address nutrition soon  HEMATOLOGIC A:  Anemia of chronic disease. P:  Trend CBC Lovenox for DVT  prevention  INFECTIOUS A: Pseudomonas UTI. Possible HCAP. PCT reassuring. P:   Abx / cultures as above  ENDOCRINE A:   Hx of hypothyroidism. P:   Synthroid  NEUROLOGIC A:  Hx of multiple sclerosis, likely flare up in setting of acute illness.  Pain. P:   PCA Prozac, Restoril, Baclofen, Neurontin Neurology consult requested  ORTHOPEDIC A:  R hip fracture. P: Awaiting surgery, would hold until pulmonary issues resolve  BABCOCK,PETE,   02/27/2013, 8:50 AM  I have personally obtained history, examined patient, evaluated and interpreted laboratory and imaging results, reviewed medical records, formulated assessment / plan and placed orders.  CRITICAL CARE:  The patient is critically ill with multiple organ systems failure and requires high complexity decision making for assessment and support, frequent evaluation and titration of therapies, application of advanced monitoring technologies and extensive interpretation of multiple databases. Critical Care Time devoted to patient care services described in this note is 45 minutes.   Lonia Farber, MD Pulmonary and Critical Care Medicine [facility] Pager: [phone]  02/27/2013, 11:28 AM

## 2013-02-27 NOTE — Progress Notes (Addendum)
ANTIBIOTIC CONSULT NOTE  Pharmacy Consult for vancomycin Indication: HCAP with atypical features  Allergies  Allergen Reactions  . Methylprednisolone Other (See Comments)    Pt states cannot take any steroids b/c they cause a decrease in her heart rate.  . Oxycodone-Acetaminophen     REACTION: unspecified  . Oxycodone-Acetaminophen     Rash head to toe  . Percocet [Oxycodone-Acetaminophen]     hallucinations  . Phenytoin     REACTION: rash  . Zanaflex [Tizanidine Hydrochloride]   . Phenytoin Sodium Extended Rash    Patient Measurements: Height: 5\' 3"  (160 cm) Weight: 134 lb 11.2 oz (61.1 kg) IBW/kg (Calculated) : 52.4   Vital Signs: Temp: 98.6 F (37 C) (12/07 1600) Temp src: Axillary (12/07 1600) BP: 96/55 mmHg (12/07 1700) Intake/Output from previous day: 12/06 0701 - 12/07 0700 In: 1704 [I.V.:749; IV Piggyback:850] Out: 3890 [Urine:3890] Intake/Output from this shift: Total I/O In: 1120 [I.V.:220; IV Piggyback:900] Out: 1575 [Urine:1575]  Labs:  Recent Labs  02/25/13 1717 02/26/13 0340 02/27/13 0350  WBC 10.6* 11.8* 10.7*  HGB 10.0* 11.0* 10.0*  PLT 200 198 224  CREATININE 0.60 0.49* 0.58   Estimated Creatinine Clearance: 61.9 ml/min (by C-G formula based on Cr of 0.58).    Microbiology: 12/3 Urine: 30K P.aeruginosa, S to all antipseudomonal agents tested 12/5 Blood x2:  NGtd 12/5 Urine: 100K GNRs, ID and S pending  12/5: Negative for S.pneumo and Legionella antigens (urine test)   Medical History: Past Medical History  Diagnosis Date  . Multiple sclerosis   . Unspecified hypothyroidism   . Other voice and resonance disorders   . Other diseases of vocal cords   . Cervical cancer 2005    Medications:  Scheduled:  . ipratropium  0.5 mg Nebulization Q6H WA   And  . albuterol  2.5 mg Nebulization Q6H WA  . ceFEPime (MAXIPIME) IV  1 g Intravenous Q8H  . dalfampridine  10 mg Oral BID  . docusate sodium  100 mg Oral BID  . enoxaparin  (LOVENOX) injection  40 mg Subcutaneous Q24H  . fesoterodine  4 mg Oral Daily  . FLUoxetine  40 mg Oral Daily  . furosemide  40 mg Intravenous Q12H  . gabapentin  600 mg Oral BID  . levofloxacin (LEVAQUIN) IV  750 mg Intravenous Q24H  . levothyroxine  75 mcg Oral Daily  . methylPREDNISolone (SOLU-MEDROL) injection  1,000 mg Intravenous Daily  . morphine   Intravenous Q4H  . pantoprazole  40 mg Oral BID AC  . polyethylene glycol  17 g Oral Daily  . senna  2 tablet Oral QHS  . sodium chloride  10-40 mL Intracatheter Q12H  . sodium phosphate  1 enema Rectal Once  . vancomycin  750 mg Intravenous Q12H  . vitamin B-12  1,000 mcg Oral Daily   Infusions:   PRN: acetaminophen, albuterol, alum & mag hydroxide-simeth, baclofen, calcium carbonate, diphenhydrAMINE, diphenhydrAMINE, naloxone, ondansetron (ZOFRAN) IV, Racepinephrine HCl, sodium chloride, sodium chloride, temazepam  Assessment: 62 y/o F with multiple sclerosis and vocal cord paresis, on home CPAP, followed by Dr. Sherene Sires.  She was admitted 12/3 after falling and fracturing R hip.  On 12/4 she developed dyspnea and hypoxemia; hip fracture repair was deferred until medically stable.  On 12/5 empiric antibiotics (vancomycin, Zosyn, azithromycin) were started for HCAP.  12/7: D#3 vancomycin 750 mg IV q12h / Zosyn 3.375 g IV q8h (extended infusion) / azithromycin 500 mg IV q24h for  HCAP.  Last night developed worsening hypoxia  and CXR demonstrated progressive diffuse bilateral pneumonia.  Orders received this AM to discontinue Zosyn/azithromycin and begin cefepime and levofloxacin with pharmacy to dose.  Continuing vancomycin.  Vancomycin trough = 8.6 mcg/ml on 750mg  IV q12h (prior to 5th dose)  Goal of Therapy:  Eradication of infection Optimal dosing of antimicrobials Vancomycin trough 15-20  Plan:   Vancomycin trough subherapeutic, will change to 750mg  IV q8h to obtain goal trough 10-33mcg/ml. Calculate new dose to achieve peak = 31,  Trough = 17  Check steady-state trough with new regimen to monitor for accumulation  Monitor renal function closely  Juliette Alcide, PharmD, BCPS.   Pager: [phone] 02/27/2013  6:03 PM

## 2013-02-27 NOTE — Progress Notes (Signed)
Patient has documented allergy to methylprednisolone due to bradycardia.  Per patient's family, patient has had adverse reaction in the past when given IV steroids and developing bradycardia in effect.  Patient's husband has expressed this concern with medical team.  Neurology consulted and ordered Methylprednisolone treatment.  Discussed with Dr. Sung Amabile with PCCM and have been instructed to administer Rx with close monitoring.  Pharmacist notified as well.  Will continue to monitor.

## 2013-02-28 ENCOUNTER — Encounter (HOSPITAL_COMMUNITY)
Admission: EM | Disposition: A | Discharge status: Rehabilitation Facility | Payer: Self-pay | Source: Home / Self Care | Attending: Pulmonary Disease

## 2013-02-28 ENCOUNTER — Inpatient Hospital Stay (HOSPITAL_COMMUNITY): Payer: 59

## 2013-02-28 LAB — COMPREHENSIVE METABOLIC PANEL
ALT: 13 U/L (ref 0–35)
AST: 23 U/L (ref 0–37)
Albumin: 2.3 g/dL — ABNORMAL LOW (ref 3.5–5.2)
Alkaline Phosphatase: 49 U/L (ref 39–117)
BUN: 19 mg/dL (ref 6–23)
CO2: 27 mEq/L (ref 19–32)
Calcium: 8.4 mg/dL (ref 8.4–10.5)
Chloride: 92 mEq/L — ABNORMAL LOW (ref 96–112)
Creatinine, Ser: 0.59 mg/dL (ref 0.50–1.10)
GFR calc Af Amer: >90 mL/min (ref >90)
GFR calc non Af Amer: >90 mL/min (ref >90)
Glucose, Bld: 135 mg/dL — ABNORMAL HIGH (ref 70–99)
Potassium: 3 mEq/L — ABNORMAL LOW (ref 3.5–5.1)
Sodium: 131 mEq/L — ABNORMAL LOW (ref 135–145)
Total Bilirubin: 0.3 mg/dL (ref 0.3–1.2)
Total Protein: 5.8 g/dL — ABNORMAL LOW (ref 6.0–8.3)

## 2013-02-28 LAB — CBC
HCT: 26.6 % — ABNORMAL LOW (ref 36.0–46.0)
Hemoglobin: 9.1 g/dL — ABNORMAL LOW (ref 12.0–15.0)
MCH: 28.8 pg (ref 26.0–34.0)
MCHC: 34.2 g/dL (ref 30.0–36.0)
MCV: 84.2 fL (ref 78.0–100.0)
Platelets: 222 10*3/uL (ref 150–400)
RBC: 3.16 MIL/uL — ABNORMAL LOW (ref 3.87–5.11)
RDW: 13.6 % (ref 11.5–15.5)
WBC: 5.5 10*3/uL (ref 4.0–10.5)

## 2013-02-28 LAB — ABO/RH: ABO/RH(D): O POS

## 2013-02-28 SURGERY — HEMIARTHROPLASTY, HIP, DIRECT ANTERIOR APPROACH, FOR FRACTURE
Anesthesia: Choice

## 2013-02-28 MED ORDER — POTASSIUM CHLORIDE 10 MEQ/50ML IV SOLN
10.0000 meq | INTRAVENOUS | Status: AC
  Administered 2013-02-28 (×3): 10 meq via INTRAVENOUS
  Filled 2013-02-28 (×6): qty 50

## 2013-02-28 MED ORDER — POTASSIUM CHLORIDE 10 MEQ/50ML IV SOLN
10.0000 meq | INTRAVENOUS | Status: AC
  Administered 2013-02-28 (×2): 10 meq via INTRAVENOUS

## 2013-02-28 NOTE — Progress Notes (Signed)
INITIAL NUTRITION ASSESSMENT  DOCUMENTATION CODES Per approved criteria  -Not Applicable   INTERVENTION: Diet advancement per MD discretion If pt remains on vent recommend Initiating Tube feeds: Initiate Vital AF 1.2 @ 20 ml/hr and increase by 10 ml every 4 hours to goal rate of 50 ml/hr. At goal rate, tube feeding regimen will provide 1440 kcal, 90 grams of protein, and 973  ml of H2O.  RD to continue to monitor  NUTRITION DIAGNOSIS: Inadequate oral intake related to inability to eat as evidenced by NPO status since 12/6.   Goal: Pt to meet >/= 90% of their estimated nutrition needs   Monitor:  Respiratory status Diet advancement Weight Labs  Reason for Assessment: Low Braden  62 y.o. female  Admitting Dx: <principal problem not specified>  ASSESSMENT: 62 yo with history of vocal cord paresis (Dr. Sherene Sires) admitted 12/03 with R hip fracture. Developed hypoxia and stridor 12/04 and transfer to ICU. Pt has history of MS and hypothyroidism.   Per RN, pt not doing well at time of visit- will check back at later time. RN reports pt is NPO for hip surgery and pt will remain intubated tomorrow. Pt has been NPO since 12/6. Weight history shows pt's weight has been stable for over a year around 129 lbs. Per nursing notes, pt's last BM was 12/2.   Height: Ht Readings from Last 1 Encounters:  02/23/13 5\' 3"  (1.6 m)    Weight: Wt Readings from Last 1 Encounters:  02/26/13 134 lb 11.2 oz (61.1 kg)    Ideal Body Weight: 115 lbs  % Ideal Body Weight: 117%  Wt Readings from Last 10 Encounters:  02/26/13 134 lb 11.2 oz (61.1 kg)  02/26/13 134 lb 11.2 oz (61.1 kg)  02/26/13 134 lb 11.2 oz (61.1 kg)  10/20/12 129 lb (58.514 kg)  07/29/12 128 lb 9.6 oz (58.333 kg)  05/20/12 126 lb (57.153 kg)  11/17/11 129 lb 3.2 oz (58.605 kg)  12/10/10 127 lb (57.607 kg)  11/26/10 128 lb 9.6 oz (58.333 kg)  10/29/10 125 lb 8 oz (56.926 kg)    Usual Body Weight: 129 lbs (August 2013)  %  Usual Body Weight: 104%  BMI:  Body mass index is 23.87 kg/(m^2).  Estimated Nutritional Needs: Kcal: 1500-1700 Protein: 75-85 grams Fluid: 1.5-1.7 L/day  Skin: +2 RLE and LLE edema  Diet Order:    EDUCATION NEEDS: -No education needs identified at this time   Intake/Output Summary (Last 24 hours) at 02/28/13 1221 Last data filed at 02/28/13 0900  Gross per 24 hour  Intake   1830 ml  Output   1465 ml  Net    365 ml    Last BM: 12/2  Labs:   Recent Labs Lab 02/26/13 0340 02/27/13 0350 02/28/13 0630  NA 133* 131* 131*  K 3.4* 3.4* 3.0*  CL 99 96 92*  CO2 19 22 27   BUN 16 16 19   CREATININE 0.49* 0.58 0.59  CALCIUM 8.8 8.8 8.4  GLUCOSE 130* 115* 135*    CBG (last 3)  No results found for this basename: GLUCAP,  in the last 72 hours  Scheduled Meds: . ipratropium  0.5 mg Nebulization Q6H WA   And  . albuterol  2.5 mg Nebulization Q6H WA  . ceFEPime (MAXIPIME) IV  1 g Intravenous Q8H  . dalfampridine  10 mg Oral BID  . docusate sodium  100 mg Oral BID  . enoxaparin (LOVENOX) injection  40 mg Subcutaneous Q24H  . fesoterodine  4 mg Oral Daily  . FLUoxetine  40 mg Oral Daily  . furosemide  40 mg Intravenous Q12H  . gabapentin  600 mg Oral BID  . levofloxacin (LEVAQUIN) IV  750 mg Intravenous Q24H  . levothyroxine  75 mcg Oral Daily  . methylPREDNISolone (SOLU-MEDROL) injection  1,000 mg Intravenous Daily  . morphine   Intravenous Q4H  . pantoprazole  40 mg Oral BID AC  . polyethylene glycol  17 g Oral Daily  . potassium chloride  10 mEq Intravenous Q1 Hr x 6  . senna  2 tablet Oral QHS  . sodium chloride  10-40 mL Intracatheter Q12H  . sodium phosphate  1 enema Rectal Once  . vancomycin  750 mg Intravenous Q8H  . vitamin B-12  1,000 mcg Oral Daily    Continuous Infusions:   Past Medical History  Diagnosis Date  . Multiple sclerosis   . Unspecified hypothyroidism   . Other voice and resonance disorders   . Other diseases of vocal cords   .  Cervical cancer 2005    Past Surgical History  Procedure Laterality Date  . Vesicovaginal fistula closure w/ tah     Ian Malkin RD, LDN Inpatient Clinical Dietitian Pager: [phone] After Hours Pager: [phone]

## 2013-02-28 NOTE — Progress Notes (Signed)
Patient ID: Kayla [name], female   DOB: [birth year], 62 y.o.   MRN: [id number]  Stable, husband reports that she is resting well.  No recent events Chart notes reviewed  Displaced right femoral neck fracture  NPO If stable enough will go to OR today otherwise will wait for medical clearance and stability Consent on chart

## 2013-02-28 NOTE — Progress Notes (Signed)
PULMONARY  / CRITICAL CARE MEDICINE  Name: Kayla [name] MRN: [id number] DOB: Nov 25, 1950    ADMISSION DATE:  02/23/2013 CONSULTATION DATE:  02/24/2013  REFERRING MD :  [facility] PRIMARY SERVICE: PCCM  REASON FOR CONSULTATION:  Hypoxemia  BRIEF PATIENT DESCRIPTION:  62 yo with history of vocal cord paresis (Dr. Sherene Sires) admitted 12/03 with R hip fracture. Developed hypoxia and stridor 12/04 and transfer to ICU.  SIGNIFICANT EVENTS / STUDIES:  12/03  Admitted with R hip fx 12/04  Transferred to ICU with stridor / hypoxemia, ENT consulted 12/04  Laryngoscopy >>>  Bilateral vocal fold paresis bilaterally 12/06  TTE >>> EF 60, diastolic dysfunction, PAP 67 torr 12/07  Neurology consulted, steroids started 12/08  OR >>>  LINES / TUBES: R PICC  12/07 >>>  CULTURES: 12/03  Urine >>> Pseudomonas (pansensetive) 12/05  Urine >>> GNR>>> 12/05  Blood >>>   ANTIBIOTICS: Rocephin 12/05 x 1 Zithromax 12/05 >>>  Vancomycin 12/05 >>> Zosyn 12/05 >>>  SUBJECTIVE:  Feels better.  VITAL SIGNS: Temp:  [97.9 F (36.6 C)-101.3 F (38.5 C)] 98.6 F (37 C) (12/08 0800) Resp:  [18-27] 21 (12/08 1000) BP: (93-129)/(47-65) 106/59 mmHg (12/08 1000) SpO2:  [91 %-100 %] 98 % (12/08 1000) FiO2 (%):  [40 %-60 %] 40 % (12/08 1000) 40% FiO2  INTAKE / OUTPUT: Intake/Output     12/07 0701 - 12/08 0700 12/08 0701 - 12/09 0700   I.V. (mL/kg) 997 (16.3) 83 (1.4)   Other     IV Piggyback 1250    Total Intake(mL/kg) 2247 (36.8) 83 (1.4)   Urine (mL/kg/hr) 2565 (1.7) 350 (1.6)   Total Output 2565 350   Net -318 -267         PHYSICAL EXAMINATION: General: Pleasant Neuro: Sleepy, slow to arouse, stronger today when c/w 12/7 HEENT: audible faint stridor off BIPAP  Cardiovascular: regular Lungs: no accessory muscle use,basilar rales  Abdomen: soft, non tender Ext: no edema  LABS: CBC  Recent Labs Lab 02/26/13 0340 02/27/13 0350 02/28/13 0630  WBC 11.8* 10.7* 5.5  HGB 11.0* 10.0* 9.1*  HCT  32.7* 29.6* 26.6*  PLT 198 224 222   Coag's No results found for this basename: APTT, INR,  in the last 168 hours BMET  Recent Labs Lab 02/26/13 0340 02/27/13 0350 02/28/13 0630  NA 133* 131* 131*  K 3.4* 3.4* 3.0*  CL 99 96 92*  CO2 19 22 27   BUN 16 16 19   CREATININE 0.49* 0.58 0.59  GLUCOSE 130* 115* 135*   Electrolytes  Recent Labs Lab 02/26/13 0340 02/27/13 0350 02/28/13 0630  CALCIUM 8.8 8.8 8.4   Sepsis Markers  Recent Labs Lab 02/24/13 1748 02/25/13 1717 02/27/13 0350  LATICACIDVEN 1.3 0.7  --   PROCALCITON  --  <0.10 0.14   ABG  Recent Labs Lab 02/24/13 1625 02/26/13 1953  PHART 7.306* 7.437  PCO2ART 41.6 35.9  PO2ART 54.8* 41.9*   Liver Enzymes  Recent Labs Lab 02/25/13 1717 02/28/13 0630  AST 42* 23  ALT 18 13  ALKPHOS 62 49  BILITOT 0.3 0.3  ALBUMIN 2.9* 2.3*   Cardiac Enzymes  Recent Labs Lab 02/25/13 0835  PROBNP 1654.0*   Glucose No results found for this basename: GLUCAP,  in the last 168 hours  PCX:  12/08 >>> Bilateral patchy airspace disease, mildly improved  ASSESSMENT / PLAN:  PULMONARY A:  Acute hypoxemic respiratory failure in setting of HCAP vs aspiration (more likely).  Less likely acute pulmonary edema.  Vocal cord paralysis.  History of reactive airway disease, but no acute bronchospasm.  OSA.  Pulmonary hypertension (PAP 67 torr). P:   Goal SpO2>92 Supplemental oxygen via face tent PRN BIPAP PRN High risk for intubation, if required use small size ( 5-6 ) ETT Hold dulera in setting of possible pneumonia Albuterol PRN Expect to come back intubated from OR  CARDIOVASCULAR A:  Acute on chronic diastolic dysfunction. P:  Goal MAP> 60  RENAL A:  Hypokalemia. P:   Trend BNP K 10 x 6 KVO  GASTROINTESTINAL A:  GERD. Constipation  P:   Protonix ( preadmission PPI) Need to address nutrition soon  HEMATOLOGIC A:  Anemia of chronic disease. P:  Trend CBC Lovenox for DVT  prevention  INFECTIOUS A: Pseudomonas UTI. Possible HCAP. PCT (0.14) reassuring. P:   Abx / cultures as above  ENDOCRINE A:   Hx of hypothyroidism. P:   Synthroid SSI  NEUROLOGIC A:  Hx of multiple sclerosis, likely flare up in setting of acute illness.  Pain. P:   PCA Prozac, Restoril, Baclofen, Neurontin Neurology following, started on high dose steroids 12/7 w/ plan for 3 day treatment   ORTHOPEDIC A:  R hip fracture. P: Ortho following Plan to OR today  BABCOCK,PETE,   02/28/2013, 10:40 AM  I have personally obtained history, examined patient, evaluated and interpreted laboratory and imaging results, reviewed medical records, formulated assessment / plan and placed orders.  CRITICAL CARE:  The patient is critically ill with multiple organ systems failure and requires high complexity decision making for assessment and support, frequent evaluation and titration of therapies, application of advanced monitoring technologies and extensive interpretation of multiple databases. Critical Care Time devoted to patient care services described in this note is 35 minutes.   Lonia Farber, MD Pulmonary and Critical Care Medicine [facility] Pager: [phone]  02/28/2013, 11:04 AM

## 2013-02-28 NOTE — Progress Notes (Signed)
SLP Cancellation Note  Patient Details Name: Kayla [name] MRN: [id number] DOB: 1950/07/14   Cancelled treatment:        Order received for swallow evaluation.  Pt. Is currently NPO for surgery.  SLP will return 12/9 to assess swallowing.  Per MD note, aspiration is suspected due to vocal cord paresis.  Recommend proceeding with MBS for objective evaluation of swallow function.  Please order MBS if you agree.   Maryjo Rochester T 02/28/2013, 12:04 PM

## 2013-02-28 NOTE — Progress Notes (Signed)
Patient evaluated in holding room. O2 sats 84% on 15L FM. Needs further optimization prior to surgery. Spoke to Dr. Charlann Boxer and CC medicine. Husband aware of issues involved. Long dicussion of GA vs spinal

## 2013-02-28 NOTE — Progress Notes (Signed)
NEURO HOSPITALIST PROGRESS NOTE   SUBJECTIVE:                                                                                                                        Patient continues to feel very weak but has had no SE from Solumedrol.  She has received one dose and due for next dose today at 10 AM.   OBJECTIVE:                                                                                                                           Vital signs in last 24 hours: Temp:  [97.9 F (36.6 C)-101.3 F (38.5 C)] 98.1 F (36.7 C) (12/08 0500) Resp:  [18-28] 18 (12/08 0900) BP: (93-129)/(47-65) 113/53 mmHg (12/08 0900) SpO2:  [91 %-100 %] 99 % (12/08 0900) FiO2 (%):  [40 %-60 %] 40 % (12/08 0900)  Intake/Output from previous day: 12/07 0701 - 12/08 0700 In: 2247 [I.V.:997; IV Piggyback:1250] Out: 2565 [Urine:2565] Intake/Output this shift: Total I/O In: 83 [I.V.:83] Out: 350 [Urine:350] Nutritional status:    Past Medical History  Diagnosis Date  . Multiple sclerosis   . Unspecified hypothyroidism   . Other voice and resonance disorders   . Other diseases of vocal cords   . Cervical cancer 2005     Neurologic Exam:  Mental Status: Alert, oriented.  Speech fluent without evidence of aphasia.  Able to follow commands to best of her ability.  Cranial Nerves: II: Visual fields grossly normal 3mm, pupils equal, round, reactive to light and accommodation III,IV, VI: ptosis not present, extra-ocular motions intact bilaterally V,VII: smile symmetric, facial light touch sensation normal bilaterally VIII: hearing normal bilaterally IX,X: gag reflex present XI: bilateral shoulder shrug XII: midline tongue extension without atrophy or fasciculations  Motor: All extremities very weak, left is stronger than the right.  Left leg unable to lift off bed and right leg has pain due to fracture.  Plantar flexion bilaterally 4/5 could not illicit good DF  bilaterally Sensory: Pinprick and light touch intact throughout, bilaterally Deep Tendon Reflexes:  1+ throughout  Plantars: Up going bilaterally    Lab Results: Lab Results  Component Value Date/Time   CHOL 205* 06/25/2010 12:50 PM  Lipid Panel No results found for this basename: CHOL, TRIG, HDL, CHOLHDL, VLDL, LDLCALC,  in the last 72 hours  Studies/Results: Dg Chest Port 1 View  02/28/2013   CLINICAL DATA:  Pneumonia  EXAM: PORTABLE CHEST - 1 VIEW  COMPARISON:  02/27/2013; 02/26/2013; 02/24/2013; 10/20/2012  FINDINGS: Grossly unchanged cardiac silhouette and mediastinal contours. Interval placement of right upper extremity approach PICC line with tip projected over the superior cavoatrial junction. No significant interval change in rather extensive interstitial and upper lung predominant opacities. Minimally improved aeration lung base. No definite pleural effusion pneumothorax. Grossly unchanged bones.  IMPRESSION: Minimally improved aeration lung bases with otherwise grossly extensive upper lung predominant interstitial opacities, again worrisome for multifocal atypical infection.   Electronically Signed   By: Simonne Come M.D.   On: 02/28/2013 07:18   Dg Chest Port 1 View  02/27/2013   CLINICAL DATA:  Pneumonia. Shortness of breath.  EXAM: PORTABLE CHEST - 1 VIEW  COMPARISON:  02/26/2013 and 02/25/2013 and 02/24/2013  FINDINGS: Bilateral pulmonary infiltrates persist with slight improvement in the right mid and lower lung zones. Heart size and vascularity are normal. I think there is a small right pleural effusion.  IMPRESSION: Persistent bilateral pneumonia with slight clearing in the right lung.   Electronically Signed   By: Geanie Cooley M.D.   On: 02/27/2013 09:18   Dg Chest Port 1 View  02/26/2013   CLINICAL DATA:  Hypoxia.  Respiratory distress.  EXAM: PORTABLE CHEST - 1 VIEW  COMPARISON:  Earlier today.  FINDINGS: Stable normal sized heart. Progressive patchy airspace opacity in  both lungs. No pleural fluid. Unremarkable bones.  IMPRESSION: Mildly progressive diffuse bilateral pneumonia.   Electronically Signed   By: Gordan Payment M.D.   On: 02/26/2013 21:44    MEDICATIONS                                                                                                                        Scheduled: . ipratropium  0.5 mg Nebulization Q6H WA   And  . albuterol  2.5 mg Nebulization Q6H WA  . ceFEPime (MAXIPIME) IV  1 g Intravenous Q8H  . dalfampridine  10 mg Oral BID  . docusate sodium  100 mg Oral BID  . enoxaparin (LOVENOX) injection  40 mg Subcutaneous Q24H  . fesoterodine  4 mg Oral Daily  . FLUoxetine  40 mg Oral Daily  . furosemide  40 mg Intravenous Q12H  . gabapentin  600 mg Oral BID  . levofloxacin (LEVAQUIN) IV  750 mg Intravenous Q24H  . levothyroxine  75 mcg Oral Daily  . methylPREDNISolone (SOLU-MEDROL) injection  1,000 mg Intravenous Daily  . morphine   Intravenous Q4H  . pantoprazole  40 mg Oral BID AC  . polyethylene glycol  17 g Oral Daily  . senna  2 tablet Oral QHS  . sodium chloride  10-40 mL Intracatheter Q12H  . sodium phosphate  1 enema Rectal Once  . vancomycin  750 mg Intravenous Q8H  . 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 Pseudomonas UTI and probable HCAP makes the possibility of a pseudo MS relapse very likely. After discussing with family, will continue with IV solumedrol for three days. Currently patient has received one dose with no complications and no bradycardia. Will continue to follow.   Assessment and plan discussed with with attending physician and they are in agreement.    Felicie Morn PA-C [facility] [phone]  02/28/2013, 9:37 AM

## 2013-03-01 ENCOUNTER — Inpatient Hospital Stay (HOSPITAL_COMMUNITY): Payer: 59

## 2013-03-01 ENCOUNTER — Encounter (HOSPITAL_COMMUNITY): Payer: 59 | Admitting: *Deleted

## 2013-03-01 ENCOUNTER — Encounter (HOSPITAL_COMMUNITY): Payer: Self-pay | Admitting: *Deleted

## 2013-03-01 ENCOUNTER — Inpatient Hospital Stay (HOSPITAL_COMMUNITY): Payer: 59 | Admitting: *Deleted

## 2013-03-01 ENCOUNTER — Encounter (HOSPITAL_COMMUNITY)
Admission: EM | Disposition: A | Discharge status: Rehabilitation Facility | Payer: Self-pay | Source: Home / Self Care | Attending: Pulmonary Disease

## 2013-03-01 HISTORY — PX: HIP ARTHROPLASTY: SHX981

## 2013-03-01 LAB — BASIC METABOLIC PANEL
BUN: 24 mg/dL — ABNORMAL HIGH (ref 6–23)
CO2: 28 mEq/L (ref 19–32)
Calcium: 8.7 mg/dL (ref 8.4–10.5)
Chloride: 93 mEq/L — ABNORMAL LOW (ref 96–112)
Creatinine, Ser: 0.68 mg/dL (ref 0.50–1.10)
GFR calc Af Amer: >90 mL/min (ref >90)
GFR calc non Af Amer: >90 mL/min (ref >90)
Glucose, Bld: 188 mg/dL — ABNORMAL HIGH (ref 70–99)
Potassium: 2.9 mEq/L — ABNORMAL LOW (ref 3.5–5.1)
Sodium: 134 mEq/L — ABNORMAL LOW (ref 135–145)

## 2013-03-01 LAB — CBC
HCT: 28.4 % — ABNORMAL LOW (ref 36.0–46.0)
Hemoglobin: 9.6 g/dL — ABNORMAL LOW (ref 12.0–15.0)
MCH: 28.7 pg (ref 26.0–34.0)
MCHC: 33.8 g/dL (ref 30.0–36.0)
MCV: 84.8 fL (ref 78.0–100.0)
Platelets: 333 10*3/uL (ref 150–400)
RBC: 3.35 MIL/uL — ABNORMAL LOW (ref 3.87–5.11)
RDW: 13.7 % (ref 11.5–15.5)
WBC: 13.2 10*3/uL — ABNORMAL HIGH (ref 4.0–10.5)

## 2013-03-01 LAB — PROCALCITONIN: Procalcitonin: <0.1 ng/mL

## 2013-03-01 SURGERY — HEMIARTHROPLASTY, HIP, DIRECT ANTERIOR APPROACH, FOR FRACTURE
Anesthesia: General | Site: Hip | Laterality: Right

## 2013-03-01 MED ORDER — NEOSTIGMINE METHYLSULFATE 1 MG/ML IJ SOLN
INTRAMUSCULAR | Status: DC | PRN
  Administered 2013-03-01: 3 mg via INTRAVENOUS

## 2013-03-01 MED ORDER — MIDAZOLAM HCL 2 MG/2ML IJ SOLN
INTRAMUSCULAR | Status: AC
  Filled 2013-03-01: qty 2

## 2013-03-01 MED ORDER — FENTANYL CITRATE 0.05 MG/ML IJ SOLN
INTRAMUSCULAR | Status: AC
  Filled 2013-03-01: qty 2

## 2013-03-01 MED ORDER — GLYCOPYRROLATE 0.2 MG/ML IJ SOLN
INTRAMUSCULAR | Status: AC
  Filled 2013-03-01: qty 1

## 2013-03-01 MED ORDER — ONDANSETRON HCL 4 MG/2ML IJ SOLN
INTRAMUSCULAR | Status: AC
  Filled 2013-03-01: qty 2

## 2013-03-01 MED ORDER — FENTANYL CITRATE 0.05 MG/ML IJ SOLN
INTRAMUSCULAR | Status: AC
  Filled 2013-03-01: qty 5

## 2013-03-01 MED ORDER — PHENOL 1.4 % MT LIQD
1.0000 | OROMUCOSAL | Status: DC | PRN
  Filled 2013-03-01: qty 177

## 2013-03-01 MED ORDER — EPHEDRINE SULFATE 50 MG/ML IJ SOLN
INTRAMUSCULAR | Status: DC | PRN
  Administered 2013-03-01 (×2): 5 mg via INTRAVENOUS
  Administered 2013-03-01: 10 mg via INTRAVENOUS

## 2013-03-01 MED ORDER — PROPOFOL 10 MG/ML IV BOLUS
INTRAVENOUS | Status: DC | PRN
  Administered 2013-03-01: 70 mg via INTRAVENOUS

## 2013-03-01 MED ORDER — LACTATED RINGERS IV SOLN
INTRAVENOUS | Status: DC | PRN
  Administered 2013-03-01: 15:00:00 via INTRAVENOUS

## 2013-03-01 MED ORDER — MENTHOL 3 MG MT LOZG
1.0000 | LOZENGE | OROMUCOSAL | Status: DC | PRN
  Filled 2013-03-01: qty 9

## 2013-03-01 MED ORDER — CEFAZOLIN SODIUM 1-5 GM-% IV SOLN
1.0000 g | Freq: Once | INTRAVENOUS | Status: AC
  Administered 2013-03-01: 1 g via INTRAVENOUS
  Filled 2013-03-01: qty 50

## 2013-03-01 MED ORDER — LIDOCAINE HCL (CARDIAC) 20 MG/ML IV SOLN
INTRAVENOUS | Status: AC
  Filled 2013-03-01: qty 5

## 2013-03-01 MED ORDER — GLYCOPYRROLATE 0.2 MG/ML IJ SOLN
INTRAMUSCULAR | Status: DC | PRN
  Administered 2013-03-01: 0.4 mg via INTRAVENOUS

## 2013-03-01 MED ORDER — PROPOFOL 10 MG/ML IV BOLUS
INTRAVENOUS | Status: AC
  Filled 2013-03-01: qty 20

## 2013-03-01 MED ORDER — MIDAZOLAM HCL 5 MG/5ML IJ SOLN
INTRAMUSCULAR | Status: DC | PRN
  Administered 2013-03-01: 0.5 mg via INTRAVENOUS

## 2013-03-01 MED ORDER — NEOSTIGMINE METHYLSULFATE 1 MG/ML IJ SOLN
INTRAMUSCULAR | Status: AC
  Filled 2013-03-01: qty 10

## 2013-03-01 MED ORDER — PROMETHAZINE HCL 25 MG/ML IJ SOLN: 6.2500 mg | INTRAMUSCULAR | Status: DC | PRN

## 2013-03-01 MED ORDER — ENOXAPARIN SODIUM 40 MG/0.4ML ~~LOC~~ SOLN
40.0000 mg | SUBCUTANEOUS | Status: DC
  Administered 2013-03-02 – 2013-03-07 (×6): 40 mg via SUBCUTANEOUS
  Filled 2013-03-01 (×7): qty 0.4

## 2013-03-01 MED ORDER — CEFAZOLIN SODIUM-DEXTROSE 2-3 GM-% IV SOLR
2.0000 g | Freq: Four times a day (QID) | INTRAVENOUS | Status: AC
  Administered 2013-03-01 – 2013-03-02 (×2): 2 g via INTRAVENOUS
  Filled 2013-03-01 (×2): qty 50

## 2013-03-01 MED ORDER — LIDOCAINE HCL (CARDIAC) 20 MG/ML IV SOLN
INTRAVENOUS | Status: DC | PRN
  Administered 2013-03-01: 100 mg via INTRAVENOUS

## 2013-03-01 MED ORDER — ONDANSETRON HCL 4 MG/2ML IJ SOLN
INTRAMUSCULAR | Status: DC | PRN
  Administered 2013-03-01 (×2): 2 mg via INTRAVENOUS

## 2013-03-01 MED ORDER — EPHEDRINE SULFATE 50 MG/ML IJ SOLN
INTRAMUSCULAR | Status: AC
  Filled 2013-03-01: qty 2

## 2013-03-01 MED ORDER — CISATRACURIUM BESYLATE (PF) 10 MG/5ML IV SOLN
INTRAVENOUS | Status: DC | PRN
  Administered 2013-03-01: 8 mg via INTRAVENOUS

## 2013-03-01 MED ORDER — ROCURONIUM BROMIDE 100 MG/10ML IV SOLN
INTRAVENOUS | Status: AC
  Filled 2013-03-01: qty 1

## 2013-03-01 MED ORDER — SODIUM CHLORIDE 0.9 % IR SOLN
Status: DC | PRN
  Administered 2013-03-01: 1000 mL

## 2013-03-01 MED ORDER — SUCCINYLCHOLINE CHLORIDE 20 MG/ML IJ SOLN
INTRAMUSCULAR | Status: AC
  Filled 2013-03-01: qty 1

## 2013-03-01 MED ORDER — FENTANYL CITRATE 0.05 MG/ML IJ SOLN
INTRAMUSCULAR | Status: DC | PRN
  Administered 2013-03-01 (×3): 25 ug via INTRAVENOUS
  Administered 2013-03-01: 50 ug via INTRAVENOUS

## 2013-03-01 MED ORDER — HYDROMORPHONE HCL PF 1 MG/ML IJ SOLN: 0.2500 mg | INTRAMUSCULAR | Status: DC | PRN

## 2013-03-01 MED ORDER — FERROUS SULFATE 325 (65 FE) MG PO TABS
325.0000 mg | ORAL_TABLET | Freq: Three times a day (TID) | ORAL | Status: DC
  Administered 2013-03-03 – 2013-03-08 (×16): 325 mg via ORAL
  Filled 2013-03-01 (×23): qty 1

## 2013-03-01 MED ORDER — POTASSIUM CHLORIDE 10 MEQ/50ML IV SOLN
10.0000 meq | INTRAVENOUS | Status: AC
  Administered 2013-03-01 (×4): 10 meq via INTRAVENOUS
  Filled 2013-03-01: qty 50

## 2013-03-01 MED ORDER — METOCLOPRAMIDE HCL 5 MG/ML IJ SOLN
INTRAMUSCULAR | Status: AC
  Filled 2013-03-01: qty 2

## 2013-03-01 MED ORDER — STERILE WATER FOR IRRIGATION IR SOLN
Status: DC | PRN
  Administered 2013-03-01: 1500 mL

## 2013-03-01 SURGICAL SUPPLY — 48 items
ADH SKN CLS APL DERMABOND .7 (GAUZE/BANDAGES/DRESSINGS) ×1
BAG SPEC THK2 15X12 ZIP CLS (MISCELLANEOUS) ×1
BAG ZIPLOCK 12X15 (MISCELLANEOUS) ×2 IMPLANT
BLADE SAW SGTL 18X1.27X75 (BLADE) ×2 IMPLANT
CAPT HIP HD POR BIPOL/UNIPOL ×1 IMPLANT
DERMABOND ADVANCED (GAUZE/BANDAGES/DRESSINGS) ×1
DERMABOND ADVANCED .7 DNX12 (GAUZE/BANDAGES/DRESSINGS) ×1 IMPLANT
DRAPE INCISE IOBAN 85X60 (DRAPES) ×2 IMPLANT
DRAPE ORTHO SPLIT 77X108 STRL (DRAPES) ×4
DRAPE POUCH INSTRU U-SHP 10X18 (DRAPES) ×2 IMPLANT
DRAPE SURG 17X11 SM STRL (DRAPES) ×2 IMPLANT
DRAPE SURG ORHT 6 SPLT 77X108 (DRAPES) ×2 IMPLANT
DRAPE U-SHAPE 47X51 STRL (DRAPES) ×2 IMPLANT
DRSG AQUACEL AG ADV 3.5X10 (GAUZE/BANDAGES/DRESSINGS) ×2 IMPLANT
DRSG TEGADERM 4X4.75 (GAUZE/BANDAGES/DRESSINGS) ×2 IMPLANT
DURAPREP 26ML APPLICATOR (WOUND CARE) ×2 IMPLANT
ELECT BLADE TIP CTD 4 INCH (ELECTRODE) ×2 IMPLANT
ELECT REM PT RETURN 9FT ADLT (ELECTROSURGICAL) ×2
ELECTRODE REM PT RTRN 9FT ADLT (ELECTROSURGICAL) ×1 IMPLANT
EVACUATOR 1/8 PVC DRAIN (DRAIN) ×2 IMPLANT
FACESHIELD LNG OPTICON STERILE (SAFETY) ×8 IMPLANT
GAUZE SPONGE 2X2 8PLY STRL LF (GAUZE/BANDAGES/DRESSINGS) ×1 IMPLANT
GLOVE BIOGEL PI IND STRL 7.5 (GLOVE) ×1 IMPLANT
GLOVE BIOGEL PI IND STRL 8 (GLOVE) ×1 IMPLANT
GLOVE BIOGEL PI INDICATOR 7.5 (GLOVE) ×1
GLOVE BIOGEL PI INDICATOR 8 (GLOVE) ×1
GLOVE ECLIPSE 8.0 STRL XLNG CF (GLOVE) IMPLANT
GLOVE ORTHO TXT STRL SZ7.5 (GLOVE) ×4 IMPLANT
GLOVE SURG ORTHO 8.0 STRL STRW (GLOVE) ×2 IMPLANT
GOWN BRE IMP PREV XXLGXLNG (GOWN DISPOSABLE) ×4 IMPLANT
GOWN PREVENTION PLUS LG XLONG (DISPOSABLE) ×2 IMPLANT
HANDPIECE INTERPULSE COAX TIP (DISPOSABLE)
IMMOBILIZER KNEE 20 (SOFTGOODS)
IMMOBILIZER KNEE 20 THIGH 36 (SOFTGOODS) IMPLANT
KIT BASIN OR (CUSTOM PROCEDURE TRAY) ×2 IMPLANT
MANIFOLD NEPTUNE II (INSTRUMENTS) ×2 IMPLANT
PACK TOTAL JOINT (CUSTOM PROCEDURE TRAY) ×2 IMPLANT
POSITIONER SURGICAL ARM (MISCELLANEOUS) ×2 IMPLANT
SET HNDPC FAN SPRY TIP SCT (DISPOSABLE) IMPLANT
SPONGE GAUZE 2X2 STER 10/PKG (GAUZE/BANDAGES/DRESSINGS) ×1
STRIP CLOSURE SKIN 1/2X4 (GAUZE/BANDAGES/DRESSINGS) ×4 IMPLANT
SUT ETHIBOND NAB CT1 #1 30IN (SUTURE) ×2 IMPLANT
SUT MNCRL AB 4-0 PS2 18 (SUTURE) ×2 IMPLANT
SUT VIC AB 1 CT1 36 (SUTURE) ×4 IMPLANT
SUT VIC AB 2-0 CT1 27 (SUTURE) ×4
SUT VIC AB 2-0 CT1 TAPERPNT 27 (SUTURE) ×2 IMPLANT
TOWEL OR 17X26 10 PK STRL BLUE (TOWEL DISPOSABLE) ×4 IMPLANT
TRAY FOLEY CATH 14FRSI W/METER (CATHETERS) ×2 IMPLANT

## 2013-03-01 NOTE — Brief Op Note (Signed)
02/23/2013 - 03/01/2013  4:19 PM  PATIENT:  Kayla [name]  62 y.o. female  PRE-OPERATIVE DIAGNOSIS:  Displaced right femoral neck fracture  POST-OPERATIVE DIAGNOSIS:  Displaced right femoral neck fracture  PROCEDURE:  Procedure(s): Right hip hemiarthroplasty, Depuy  SURGEON:  Surgeon(s) and Role:    * Shelda Pal, MD - Primary  PHYSICIAN ASSISTANT: Lanney Gins, PA-C  ANESTHESIA:   general  EBL:  Total I/O In: 417.9 [I.V.:67.9; IV Piggyback:350] Out: 825 [Urine:750; Blood:75]  BLOOD ADMINISTERED:none  DRAINS: none   LOCAL MEDICATIONS USED:  NONE  SPECIMEN:  No Specimen  DISPOSITION OF SPECIMEN:  N/A  COUNTS:  YES  TOURNIQUET:  * No tourniquets in log *  DICTATION: .Other Dictation: Dictation Number [id number]  PLAN OF CARE: Admit to inpatient   PATIENT DISPOSITION:  PACU - guarded condition.   Delay start of Pharmacological VTE agent (>24hrs) due to surgical blood loss or risk of bleeding: no

## 2013-03-01 NOTE — Transfer of Care (Signed)
Immediate Anesthesia Transfer of Care Note  Patient: Kayla [name]  Procedure(s) Performed: Procedure(s): ARTHROPLASTY BIPOLAR HIP (Right)  Patient Location: ICU  Anesthesia Type:General  Level of Consciousness: sedated  Airway & Oxygen Therapy: Patient Spontanous Breathing and Patient connected to face mask oxygen  Post-op Assessment: Post -op Vital signs reviewed and stable  Post vital signs: stable  Complications: No apparent anesthesia complications

## 2013-03-01 NOTE — Progress Notes (Signed)
CARE MANAGEMENT NOTE 03/01/2013  Patient:  Kayla [name], Kayla [name]   Account Number:  [id number]  Date Initiated:  02/24/2013  Documentation initiated by:  Colleen Can  Subjective/Objective Assessment:   dx displaced rt femorl neck fracture following a fall     Action/Plan:   Pt will be having surgery. CM will follow post op   Anticipated DC Date:  03/04/2013   Anticipated DC Plan:  HOME W HOME HEALTH SERVICES  In-house referral  Clinical Social Worker      DC Planning Services  CM consult      Choice offered to / List presented to:             Status of service:  In process, will continue to follow Medicare Important Message given?   (If response is "NO", the following Medicare IM given date fields will be blank) Date Medicare IM given:   Date Additional Medicare IM given:    Discharge Disposition:    Per UR Regulation:  Reviewed for med. necessity/level of care/duration of stay  If discussed at Long Length of Stay Meetings, dates discussed:    Comments:  12092014/Asti Mackley Stark Jock, BSN, [place] [phone] Chart Reviewed for discharge and hospital needs.  Surgicalr epair of the fhip postsopned due to resp status, 02 down to75% fi02 increased to 40%. Discharge needs at time of review:  None present will follow for needs. Review of patient progress due on [id number].   12082014/pt scheduled for or today for thr/will follow post op/Yetunde Leis,RN,BSN,CCM  02/24/2013 Colleen Can BSN RN CCM [phone] Pt was transferred to [facility] unit d/c decline in health status.

## 2013-03-01 NOTE — Progress Notes (Signed)
CSW continuing to follow to assist with disposition needs as appropriate.  CSW noted orthopaedic MD note that pt will have operation today.  CSW to follow up following operation to assist with disposition needs.  Jacklynn Lewis, MSW, LCSWA  Clinical Social Work [phone]

## 2013-03-01 NOTE — Progress Notes (Signed)
NEURO HOSPITALIST PROGRESS NOTE   SUBJECTIVE:                                                                                                                         Patient is SOB and per family having some difficulty with swallowing (but this is chronic). She has received 2 doses solumedrol and had no problems with Bradycardia. Planned for third dose today.  Per family is usually is the last dose which causes Bradycardia. She was to go for surgery today but due to O2 sats in the 80's on 15L surgery was held off.  OBJECTIVE:                                                                                                                           Vital signs in last 24 hours: Temp:  [98.4 F (36.9 C)-100.3 F (37.9 C)] 98.9 F (37.2 C) (12/09 0800) Pulse Rate:  [91-99] 94 (12/09 0423) Resp:  [16-27] 22 (12/09 0900) BP: (98-162)/(51-75) 127/66 mmHg (12/09 0900) SpO2:  [75 %-100 %] 100 % (12/09 0900) FiO2 (%):  [40 %-60 %] 40 % (12/09 0423) Weight:  [63.3 kg (139 lb 8.8 oz)] 63.3 kg (139 lb 8.8 oz) (12/09 0400)  Intake/Output from previous day: 12/08 0701 - 12/09 0700 In: 1398 [I.V.:748; IV Piggyback:650] Out: 2800 [Urine:2800] Intake/Output this shift: Total I/O In: 40 [I.V.:40] Out: -  Nutritional status: General  Past Medical History  Diagnosis Date  . Multiple sclerosis   . Unspecified hypothyroidism   . Other voice and resonance disorders   . Other diseases of vocal cords   . Cervical cancer 2005     Neurologic Exam:   Mental Status:  Alert, oriented. Speech fluent without evidence of aphasia. Able to follow commands to best of her ability.  Cranial Nerves:  II: Visual fields grossly normal 3mm, pupils equal, round, reactive to light and accommodation  III,IV, VI: ptosis not present, extra-ocular motions intact bilaterally  V,VII: smile symmetric, facial light touch sensation normal bilaterally  VIII: hearing normal bilaterally   IX,X: gag reflex present  XI: bilateral shoulder shrug  XII: midline tongue extension without atrophy or fasciculations  Motor:  All extremities are stronger today with good grip  but still 4-/5 bilateral UE (left stronger than right). Left leg unable to lift off bed and right leg has pain due to fracture.  Plantar flexion bilaterally 4/5 could not illicit good DF bilaterally  Sensory: Pinprick and light touch intact throughout, bilaterally  Deep Tendon Reflexes:  1+ throughout  Plantars:  Up going bilaterally  Lab Results: Lab Results  Component Value Date/Time   CHOL 205* 06/25/2010 12:50 PM   Lipid Panel No results found for this basename: CHOL, TRIG, HDL, CHOLHDL, VLDL, LDLCALC,  in the last 72 hours  Studies/Results: Dg Chest Port 1 View  03/01/2013   CLINICAL DATA:  Pneumonia  EXAM: PORTABLE CHEST - 1 VIEW  COMPARISON:  02/28/2013; 02/27/2013; 02/26/2013; 10/20/2012  FINDINGS: Grossly unchanged cardiac silhouette and mediastinal contours. Stable positioning of support apparatus. Grossly unchanged bilateral upper lung predominant interstitial opacities. No new focal airspace opacities. Trace bilateral effusions are suspected, left greater than right. No pneumothorax. Grossly unchanged bones.  IMPRESSION: Grossly unchanged bilateral upper lung predominant interstitial opacities again worrisome for multifocal atypical infection. A follow-up chest radiograph in 4 to 6 weeks after treatment is recommended to ensure resolution.   Electronically Signed   By: Simonne Come M.D.   On: 03/01/2013 07:34   Dg Chest Port 1 View  02/28/2013   CLINICAL DATA:  Pneumonia  EXAM: PORTABLE CHEST - 1 VIEW  COMPARISON:  02/27/2013; 02/26/2013; 02/24/2013; 10/20/2012  FINDINGS: Grossly unchanged cardiac silhouette and mediastinal contours. Interval placement of right upper extremity approach PICC line with tip projected over the superior cavoatrial junction. No significant interval change in rather extensive  interstitial and upper lung predominant opacities. Minimally improved aeration lung base. No definite pleural effusion pneumothorax. Grossly unchanged bones.  IMPRESSION: Minimally improved aeration lung bases with otherwise grossly extensive upper lung predominant interstitial opacities, again worrisome for multifocal atypical infection.   Electronically Signed   By: Simonne Come M.D.   On: 02/28/2013 07:18    MEDICATIONS                                                                                                                        Scheduled: . ipratropium  0.5 mg Nebulization Q6H WA   And  . albuterol  2.5 mg Nebulization Q6H WA  . dalfampridine  10 mg Oral BID  . docusate sodium  100 mg Oral BID  . enoxaparin (LOVENOX) injection  40 mg Subcutaneous Q24H  . fesoterodine  4 mg Oral Daily  . FLUoxetine  40 mg Oral Daily  . furosemide  40 mg Intravenous Q12H  . gabapentin  600 mg Oral BID  . levofloxacin (LEVAQUIN) IV  750 mg Intravenous Q24H  . levothyroxine  75 mcg Oral Daily  . methylPREDNISolone (SOLU-MEDROL) injection  1,000 mg Intravenous Daily  . morphine   Intravenous Q4H  . pantoprazole  40 mg Oral BID AC  . polyethylene glycol  17 g Oral Daily  . potassium chloride  10 mEq Intravenous Q1 Hr x  6  . senna  2 tablet Oral QHS  . sodium chloride  10-40 mL Intracatheter Q12H  . sodium phosphate  1 enema Rectal Once  . vitamin B-12  1,000 mcg Oral Daily    ASSESSMENT/PLAN:                                                                                                             62 y/o with long standing h/o MS, JC virus positive on monthly tydabri infusion. Currently being treated for Pseudomonas UTI  And HCAP. Discussed with Dr. Leroy Kennedy and given multiple infections and right hip fracture, pseudo MS exacerbation is likely. Surgery has been held due to low oxygen saturations. Will receive her 3rd and final dose of Solumedrol today.   Neurology will follow from afar after  third dose of steroids.    Assessment and plan discussed with with attending physician and they are in agreement.    Felicie Morn PA-C [facility] [phone]  03/01/2013, 9:58 AM

## 2013-03-01 NOTE — Progress Notes (Signed)
Patient ID: Kayla [name], female   DOB: [birth year], 62 y.o.   MRN: [id number]  After events of yesterday I have had long discussion with CCM team.  The feeling is that she is currently as stable as she will be and we have decided to proceed with the operation  She is to be treated with a right hip hemiarthroplasty for her femoral neck fracture to assist with pain control but also mobility.  All teams aware of condition and plan now is for general anesthesia with intubation and to keep her intubated and to return to ICU for further stabilization and weaning.  Family aware of risks and ready to proceed.  NPO

## 2013-03-01 NOTE — Anesthesia Preprocedure Evaluation (Signed)
Anesthesia Evaluation  Patient identified by MRN, date of birth, ID band Patient awake    Reviewed: Allergy & Precautions, H&P , NPO status , Patient's Chart, lab work & pertinent test results  Airway Mallampati: II TM Distance: >3 FB Neck ROM: Limited    Dental  (+) Edentulous Upper   Pulmonary shortness of breath and at rest, pneumonia -, unresolved,  breath sounds clear to auscultation  + decreased breath sounds + stridor     Cardiovascular negative cardio ROS  Rhythm:Regular Rate:Normal     Neuro/Psych MS negative psych ROS   GI/Hepatic negative GI ROS, Neg liver ROS,   Endo/Other  Hypothyroidism   Renal/GU negative Renal ROS  negative genitourinary   Musculoskeletal negative musculoskeletal ROS (+)   Abdominal   Peds negative pediatric ROS (+)  Hematology  (+) anemia ,   Anesthesia Other Findings   Reproductive/Obstetrics negative OB ROS                           Anesthesia Physical Anesthesia Plan  ASA: IV  Anesthesia Plan: General   Post-op Pain Management:    Induction: Intravenous  Airway Management Planned: Oral ETT  Additional Equipment:   Intra-op Plan:   Post-operative Plan: Post-operative intubation/ventilation  Informed Consent: I have reviewed the patients History and Physical, chart, labs and discussed the procedure including the risks, benefits and alternatives for the proposed anesthesia with the patient or authorized representative who has indicated his/her understanding and acceptance.   Dental advisory given  Plan Discussed with: CRNA and Surgeon  Anesthesia Plan Comments:         Anesthesia Quick Evaluation

## 2013-03-01 NOTE — Progress Notes (Signed)
SLP Cancellation Note  Patient Details Name: ADDIS BENNIE MRN: [id number] DOB: 1950/12/16   Cancelled treatment:        Pt. For Modified Barium Swallow evaluation, but surgery was deferred from yesterday until today.  Again, pt. Is NPO for surgery.  Will f/u 12/10.    MD, will pt. Be able to sit at 90 degree angle for MBS tomorrow?   Maryjo Rochester T 03/01/2013, 12:10 PM

## 2013-03-01 NOTE — Progress Notes (Addendum)
PULMONARY  / CRITICAL CARE MEDICINE  Name: Kayla [name] MRN: [id number] DOB: 01-May-1950    ADMISSION DATE:  02/23/2013 CONSULTATION DATE:  02/24/2013  REFERRING MD :  [facility] PRIMARY SERVICE: PCCM  REASON FOR CONSULTATION:  Hypoxemia  BRIEF PATIENT DESCRIPTION:  62 yo with history of vocal cord paresis (Dr. Sherene Sires) admitted 12/03 with R hip fracture. Developed hypoxia and stridor 12/04 and transfer to ICU.  SIGNIFICANT EVENTS / STUDIES:  12/03  Admitted with R hip fx 12/04  Transferred to ICU with stridor / hypoxemia, ENT consulted 12/04  Laryngoscopy >>>  Bilateral vocal fold paresis bilaterally 12/06  TTE >>> EF 60, diastolic dysfunction, PAP 67 torr 12/07  Neurology consulted, steroids started 12/08  OR >>> canceled due to increased oxygen requirements 12/08  SLP >>> barium swallow study recommended  LINES / TUBES: R PICC  12/07 >>>  CULTURES: 12/04  MRSA PCR >>> neg 12/03  Urine >>> Pseudomonas (pansensetive) 12/05  Urine >>> GNR>>> 12/05  Blood >>>   ANTIBIOTICS: Rocephin 12/05 x 1 Zithromax 12/05 >>>12/07 Vancomycin 12/05 >>> 12/09 Zosyn 12/05 >>> 12/07 Cefepime 12/07 >>> 12/07 Levaquin 12/07 >>>  INTERVAL HISTORY:  OR cancelled yesterday due to respiratory status.  VITAL SIGNS: Temp:  [98.4 F (36.9 C)-100.3 F (37.9 C)] 100.3 F (37.9 C) (12/09 0400) Pulse Rate:  [91-99] 94 (12/09 0423) Resp:  [16-23] 19 (12/09 0444) BP: (104-123)/(52-64) 110/57 mmHg (12/09 0400) SpO2:  [88 %-100 %] 97 % (12/09 0444) FiO2 (%):  [40 %-60 %] 40 % (12/09 0423) Weight:  [63.3 kg (139 lb 8.8 oz)] 63.3 kg (139 lb 8.8 oz) (12/09 0400) 40% FiO2  INTAKE / OUTPUT: Intake/Output     12/08 0701 - 12/09 0700 12/09 0701 - 12/10 0700   I.V. (mL/kg) 348 (5.5)    IV Piggyback 450    Total Intake(mL/kg) 798 (12.6)    Urine (mL/kg/hr) 2800 (1.8)    Total Output 2800     Net -2002           PHYSICAL EXAMINATION: General: No distress Neuro: Awake, cooperative HEENT: PERRL, no  JVD Cardiovascular: regular, no murmurs Lungs: Bilateral air entry, rales Abdomen: Soft, non tender, bowel sounds present Ext: No edema  LABS: CBC  Recent Labs Lab 02/27/13 0350 02/28/13 0630 03/01/13 0545  WBC 10.7* 5.5 13.2*  HGB 10.0* 9.1* 9.6*  HCT 29.6* 26.6* 28.4*  PLT 224 222 333   Coag's No results found for this basename: APTT, INR,  in the last 168 hours BMET  Recent Labs Lab 02/27/13 0350 02/28/13 0630 03/01/13 0545  NA 131* 131* 134*  K 3.4* 3.0* 2.9*  CL 96 92* 93*  CO2 22 27 28   BUN 16 19 24*  CREATININE 0.58 0.59 0.68  GLUCOSE 115* 135* 188*   Electrolytes  Recent Labs Lab 02/27/13 0350 02/28/13 0630 03/01/13 0545  CALCIUM 8.8 8.4 8.7   Sepsis Markers  Recent Labs Lab 02/24/13 1748 02/25/13 1717 02/27/13 0350 03/01/13 0545  LATICACIDVEN 1.3 0.7  --   --   PROCALCITON  --  <0.10 0.14 <0.10   ABG  Recent Labs Lab 02/24/13 1625 02/26/13 1953  PHART 7.306* 7.437  PCO2ART 41.6 35.9  PO2ART 54.8* 41.9*   Liver Enzymes  Recent Labs Lab 02/25/13 1717 02/28/13 0630  AST 42* 23  ALT 18 13  ALKPHOS 62 49  BILITOT 0.3 0.3  ALBUMIN 2.9* 2.3*   Cardiac Enzymes  Recent Labs Lab 02/25/13 0835  PROBNP 1654.0*   Glucose  No results found for this basename: GLUCAP,  in the last 168 hours  PCX:  12/09 >>> Bilateral patchy airspace disease, unchanged  ASSESSMENT / PLAN:  PULMONARY A:  Acute hypoxemic respiratory failure in setting of HCAP vs aspiration (more likely).  Less likely acute pulmonary edema.  Vocal cord paralysis.  History of reactive airway disease, but no acute bronchospasm.  OSA.  Pulmonary hypertension (PAP 67 torr). P:   Goal SpO2>92 Supplemental oxygen via face tent PRN BIPAP PRN High risk for intubation, if required use small size ( 5-6 ) ETT Hold dulera in setting of possible pneumonia Albuterol PRN  CARDIOVASCULAR A:  Acute on chronic diastolic dysfunction. P:  Goal MAP> 60  RENAL A:   Hypokalemia. P:   Trend BMP K 10 x 6 KVO  GASTROINTESTINAL A:  GERD. Constipation. Dysphagia. P:   Protonix ( preadmission PPI) Modified barium swallow, if unable to perform or failed - start tube feeding  HEMATOLOGIC A:  Anemia of chronic disease. P:  Trend CBC Lovenox for DVT prevention  INFECTIOUS A: Pseudomonas UTI. Possible HCAP. PCT (0.14) reassuring. P:   Abx / cultures as above D/c Vancomycin / Cefepime  ENDOCRINE A:   Hx of hypothyroidism. P:   Synthroid SSI  NEUROLOGIC A:  Hx of multiple sclerosis, likely flare up in setting of acute illness.  Pain. P:   PCA Prozac, Restoril, Baclofen, Neurontin Neurology following, started on high dose steroids 12/7 w/ plan for 3 day treatment   ORTHOPEDIC A:  R hip fracture. P: Will discuss plans with Ortho  I have personally obtained history, examined patient, evaluated and interpreted laboratory and imaging results, reviewed medical records, formulated assessment / plan and placed orders.  CRITICAL CARE:  The patient is critically ill with multiple organ systems failure and requires high complexity decision making for assessment and support, frequent evaluation and titration of therapies, application of advanced monitoring technologies and extensive interpretation of multiple databases. Critical Care Time devoted to patient care services described in this note is 35 minutes.   Lonia Farber, MD Pulmonary and Critical Care Medicine [facility] Pager: [phone]  03/01/2013, 8:06 AM

## 2013-03-02 ENCOUNTER — Inpatient Hospital Stay (HOSPITAL_COMMUNITY): Payer: 59

## 2013-03-02 ENCOUNTER — Encounter (HOSPITAL_COMMUNITY): Payer: Self-pay | Admitting: Orthopedic Surgery

## 2013-03-02 LAB — BASIC METABOLIC PANEL
BUN: 30 mg/dL — ABNORMAL HIGH (ref 6–23)
BUN: 27 mg/dL — ABNORMAL HIGH (ref 6–23)
CO2: 28 mEq/L (ref 19–32)
CO2: 29 mEq/L (ref 19–32)
Calcium: 8 mg/dL — ABNORMAL LOW (ref 8.4–10.5)
Calcium: 8.6 mg/dL (ref 8.4–10.5)
Chloride: 96 mEq/L (ref 96–112)
Chloride: 99 mEq/L (ref 96–112)
Creatinine, Ser: 0.74 mg/dL (ref 0.50–1.10)
Creatinine, Ser: 0.69 mg/dL (ref 0.50–1.10)
GFR calc Af Amer: >90 mL/min (ref >90)
GFR calc Af Amer: >90 mL/min (ref >90)
GFR calc non Af Amer: >90 mL/min (ref >90)
GFR calc non Af Amer: >90 mL/min (ref >90)
Glucose, Bld: 164 mg/dL — ABNORMAL HIGH (ref 70–99)
Glucose, Bld: 129 mg/dL — ABNORMAL HIGH (ref 70–99)
Potassium: 2.9 mEq/L — ABNORMAL LOW (ref 3.5–5.1)
Potassium: 4.3 mEq/L (ref 3.5–5.1)
Sodium: 135 mEq/L (ref 135–145)
Sodium: 136 mEq/L (ref 135–145)

## 2013-03-02 LAB — CBC
HCT: 24.1 % — ABNORMAL LOW (ref 36.0–46.0)
Hemoglobin: 8.2 g/dL — ABNORMAL LOW (ref 12.0–15.0)
MCH: 29 pg (ref 26.0–34.0)
MCHC: 34 g/dL (ref 30.0–36.0)
MCV: 85.2 fL (ref 78.0–100.0)
Platelets: 263 10*3/uL (ref 150–400)
RBC: 2.83 MIL/uL — ABNORMAL LOW (ref 3.87–5.11)
RDW: 13.9 % (ref 11.5–15.5)
WBC: 9.7 10*3/uL (ref 4.0–10.5)

## 2013-03-02 LAB — URINE CULTURE: Colony Count: >=100000

## 2013-03-02 MED ORDER — FUROSEMIDE 10 MG/ML IJ SOLN
40.0000 mg | Freq: Every day | INTRAMUSCULAR | Status: DC
  Administered 2013-03-02 – 2013-03-03 (×2): 40 mg via INTRAVENOUS
  Filled 2013-03-02: qty 4

## 2013-03-02 MED ORDER — PANTOPRAZOLE SODIUM 40 MG IV SOLR
40.0000 mg | Freq: Two times a day (BID) | INTRAVENOUS | Status: DC
  Administered 2013-03-02 – 2013-03-06 (×10): 40 mg via INTRAVENOUS
  Filled 2013-03-02 (×12): qty 40

## 2013-03-02 MED ORDER — POTASSIUM CHLORIDE CRYS ER 20 MEQ PO TBCR: 40.0000 meq | EXTENDED_RELEASE_TABLET | ORAL | Status: DC

## 2013-03-02 MED ORDER — POTASSIUM CHLORIDE 10 MEQ/50ML IV SOLN
10.0000 meq | INTRAVENOUS | Status: AC
  Administered 2013-03-02 (×8): 10 meq via INTRAVENOUS
  Filled 2013-03-02: qty 50

## 2013-03-02 NOTE — Evaluation (Signed)
Physical Therapy Evaluation Patient Details Name: Kayla [name] MRN: [id number] DOB: 1950/10/27 Today's Date: 03/02/2013 Time: 4098-1191 PT Time Calculation (min): 30 min  PT Assessment / Plan / Recommendation History of Present Illness  Pt. admitted 12/4 after trip and fall, R subcapital hip fx. Began respiratory distress, surgery for hemiarthroplasty performed 03/01/13. Pt has h/o MS.  Clinical Impression  Pt tolerated mobilizing to edge of bed and sat x 10' with min c/o pain. Noted resp distress more after taking a drink. Pt. Will benefit from PT to address problems listed.    PT Assessment  Patient needs continued PT services    Follow Up Recommendations  SNF    Does the patient have the potential to tolerate intense rehabilitation      Barriers to Discharge        Equipment Recommendations  Rolling walker with 5" wheels    Recommendations for Other Services     Frequency Min 3X/week    Precautions / Restrictions Precautions Precautions: Fall;Posterior Hip Restrictions RLE Weight Bearing: Weight bearing as tolerated   Pertinent Vitals/Pain HR 90-1901 sats .90% 6 l,  BP sitting 132/60      Mobility  Bed Mobility Bed Mobility: Supine to Sit Supine to Sit: 1: +2 Total assist Supine to Sit: Patient Percentage: 0% Sit to Supine: 1: +2 Total assist Sit to Supine: Patient Percentage: 0% Details for Bed Mobility Assistance: assist for trunk and bil LEs    Exercises     PT Diagnosis: Difficulty walking;Generalized weakness;Acute pain  PT Problem List: Decreased strength;Decreased activity tolerance;Decreased range of motion;Decreased balance;Decreased mobility;Decreased cognition;Decreased knowledge of precautions;Cardiopulmonary status limiting activity;Decreased safety awareness;Pain PT Treatment Interventions: DME instruction;Gait training;Functional mobility training;Therapeutic activities;Therapeutic exercise;Patient/family education     PT Goals(Current  goals can be found in the care plan section) Acute Rehab PT Goals Patient Stated Goal: none stated.  agreeable to ot./pt PT Goal Formulation: With patient/family Time For Goal Achievement: 03/16/13 Potential to Achieve Goals: Good  Visit Information  Last PT Received On: 03/02/13 Assistance Needed: +2 PT/OT/SLP Co-Evaluation/Treatment: Yes PT goals addressed during session: Mobility/safety with mobility;Balance OT goals addressed during session: Strengthening/ROM;Other (comment) (educated on ADLs/precautions) History of Present Illness: Pt. admitted 12/4 after trip and fall, R subcapital hip fx. Began respiratory distress, surgery for hemiarthroplasty performed 03/01/13. Pt has h/o MS.       Prior Functioning  Home Living Family/patient expects to be discharged to:: Unsure Living Arrangements: Spouse/significant other Additional Comments: pt has 4 ste, 1 level home, tub which she is in the process of converting to shower, and standard commode Prior Function Level of Independence: Independent with assistive device(s) Communication Communication: No difficulties    Cognition  Cognition Arousal/Alertness: Awake/alert Behavior During Therapy: WFL for tasks assessed/performed Overall Cognitive Status:  (pt reports visual hallucinations.)    Extremity/Trunk Assessment Upper Extremity Assessment Upper Extremity Assessment: Defer to OT evaluation Lower Extremity Assessment Lower Extremity Assessment: LLE deficits/detail;RLE deficits/detail RLE Deficits / Details: hip flexion to 70-8- in sitting, knee flexion  60 in sitting, dorsi flextion lacks neutral, appears incr extension and plantar flexion postur in supine.h/o drop foot. LLE Deficits / Details: about same as R, feet and knees tend to posture into extension in supine   Balance Balance Balance Assessed: Yes Static Sitting Balance Static Sitting - Balance Support: Bilateral upper extremity supported Static Sitting - Level of  Assistance: 5: Stand by assistance Static Sitting - Comment/# of Minutes: 10  End of Session PT - End of Session Activity Tolerance:  Patient tolerated treatment well;Patient limited by fatigue Patient left: in bed;with call bell/phone within reach;with family/visitor present Nurse Communication: Mobility status  GP     Rada Hay 03/02/2013, 4:00 PM  Blanchard Kelch PT [phone]

## 2013-03-02 NOTE — Procedures (Signed)
Objective Swallowing Evaluation: Modified Barium Swallowing Study  Patient Details  Name: Kayla [name]   MRN: [id number] Date of Birth: 21-Oct-1950  Today's Date: 03/02/2013 Time: 1325-1404 SLP Time Calculation (min): 39 min  Past Medical History:  Past Medical History  Diagnosis Date  . Multiple sclerosis   . Unspecified hypothyroidism   . Other voice and resonance disorders   . Other diseases of vocal cords   . Cervical cancer 2005   Past Surgical History:  Past Surgical History  Procedure Laterality Date  . Vesicovaginal fistula closure w/ tah    . Hip arthroplasty Right 03/01/2013    Procedure: ARTHROPLASTY BIPOLAR HIP;  Surgeon: Shelda Pal, MD;  Location: [facility];  Service: Orthopedics;  Laterality: Right;   HPI:  Kayla [name] is a 62 y.o. female with PMH of relapsing MS followed by Dr.DOuglas, with foot drop, Hypothyrodiism, osteoporosis and was walking today and tripped and had a mechanical fall due to a rug, subsequently started having R hip pain and was unable to ambulate, subsequently EMS was called and brought to [facility] ER.  She was found to have a  Displaced right subcapital femoral neck fracture, 1 day post-op.     Assessment / Plan / Recommendation Clinical Impression  Dysphagia Diagnosis: Moderate pharyngeal phase dysphagia;Moderate cervical esophageal phase dysphagia Clinical impression: Pt. exhibits a moderate pharyngeal and cervical esophageal dysphagia, characterized by reduced base of tongue contraction to the pharyngeal wall, reduced pharyngeal perestalsis, and reduced crichopharyngeal relaxation. These deficits resulted in moderate pharyngeal residue with solids in the vallculae and pyriform sinuses.  The pt. has a very unconventional way of swallowing pills, which greatly increases her risk of aspiration.  Pt. asked SLP to give her liquid first, then place pill in her mouth.  The pt. then extended her neck (head back) and aspirated the liquid while  swallowing the pill.  Family reports this is her usual "technique" for taking pills. Will begin po's with caution, due to current breathing pattern with inspiratory stridor.  Encourage pt. to breath through her nose with mouth closed, and follow aspiration precautions and compensatory strategies (chin tuck with solids, double swallow and follow with sip of liquid).  Full supervision and max assist, with verbal and tactile cues, is required.  If coughing with po's, may need to return to NPO.      Treatment Recommendation  Therapy as outlined in treatment plan below    Diet Recommendation Dysphagia 2 (Fine chop);Thin liquid   Liquid Administration via: Cup;No straw Medication Administration: Whole meds with puree Supervision: Staff to assist with self feeding;Full supervision/cueing for compensatory strategies;Trained caregiver to feed patient Compensations: Slow rate;Small sips/bites;Multiple dry swallows after each bite/sip;Follow solids with liquid Postural Changes and/or Swallow Maneuvers: Seated upright 90 degrees;Upright 30-60 min after meal    Other  Recommendations Oral Care Recommendations: Oral care Q4 per protocol;Staff/trained caregiver to provide oral care Other Recommendations: Clarify dietary restrictions   Follow Up Recommendations  24 hour supervision/assistance;Home health SLP    Frequency and Duration min 3x week  2 weeks   Pertinent Vitals/Pain Inspiratory stridor; CXR:  Improved bilateral pulmonary infiltrates; Low grade fever.    SLP Swallow Goals     General HPI: Kayla [name] is a 62 y.o. female with PMH of relapsing MS followed by Dr.DOuglas, with foot drop, Hypothyrodiism, osteoporosis and was walking today and tripped and had a mechanical fall due to a rug, subsequently started having R hip pain and was unable to ambulate, subsequently  EMS was called and brought to [facility] ER.  She was found to have a  Displaced right subcapital femoral neck fracture, 1 day  post-op. Type of Study: Modified Barium Swallowing Study Reason for Referral: Objectively evaluate swallowing function Previous Swallow Assessment: None Diet Prior to this Study: NPO Temperature Spikes Noted: Yes (Low grade) Respiratory Status: Nasal cannula (Tent O2 removed for this study) History of Recent Intubation: Yes Length of Intubations (days): 1 days (for surgery) Date extubated: 03/01/13 Behavior/Cognition: Alert;Cooperative;Requires cueing Oral Cavity - Dentition: Adequate natural dentition Oral Motor / Sensory Function: Within functional limits Self-Feeding Abilities: Needs assist Patient Positioning: Upright in chair Baseline Vocal Quality: Clear;Low vocal intensity Volitional Cough: Weak Volitional Swallow: Able to elicit Anatomy: Within functional limits Pharyngeal Secretions: Not observed secondary MBS    Reason for Referral Objectively evaluate swallowing function   Oral Phase Oral Preparation/Oral Phase Oral Phase: WFL   Pharyngeal Phase Pharyngeal Phase Pharyngeal Phase: Impaired Pharyngeal - Thin Pharyngeal - Thin Straw: Penetration/Aspiration during swallow;Other (Comment) (While swallowing pill.  ) Penetration/Aspiration details (thin straw): Material enters airway, remains ABOVE vocal cords and not ejected out Pharyngeal - Solids Pharyngeal - Regular: Reduced pharyngeal peristalsis;Reduced tongue base retraction  Cervical Esophageal Phase    GO    Cervical Esophageal Phase Cervical Esophageal Phase: Impaired Cervical Esophageal Phase - Solids Regular: Reduced cricopharyngeal relaxation;Prominent cricopharyngeal segment (Pill lodged on UES, cleared with sip of liquid) Pill: Reduced cricopharyngeal relaxation;Prominent cricopharyngeal segment (Pill lodged on UES, cleared with sip of liquid)         Kayla [name] T 03/02/2013, 3:59 PM

## 2013-03-02 NOTE — Progress Notes (Signed)
Speech Language Pathology Treatment: Dysphagia  Patient Details Name: Kayla [name] MRN: [id number] DOB: Aug 14, 1950 Today's Date: 03/02/2013 Time: 4098-1191 SLP Time Calculation (min): 16 min  Assessment / Plan / Recommendation Clinical Impression  F/U education to patient and daughter re: compensatory strategies to reduce aspiration risks.  Educated to d/c straws and give verbal/tactile cues to follow chin tuck and double swallows with solids, followed by sip of liquid to clear laryngeal residue.  Pt. Does not follow these instructions independently, and requires max cues to perform chin tuck and double swallows.  Pt. Did have a very weak cough during the meal.  RN suctioned and obtained what appeared to be mucous, but no food from oropharynx.  Proceed with care.   HPI HPI: Kayla [name] is a 62 y.o. female with PMH of relapsing MS followed by Dr.DOuglas, with foot drop, Hypothyrodiism, osteoporosis and was walking today and tripped and had a mechanical fall due to a rug, subsequently started having R hip pain and was unable to ambulate, subsequently EMS was called and brought to [facility] ER.  She was found to have a  Displaced right subcapital femoral neck fracture, 1 day post-op.   Pertinent Vitals Inspiratory stridor.  SLP Plan  Continue with current plan of care    Recommendations Diet recommendations: Dysphagia 2 (fine chop);Thin liquid Liquids provided via: Cup;No straw Medication Administration: Whole meds with puree Supervision: Staff to assist with self feeding;Full supervision/cueing for compensatory strategies;Trained caregiver to feed patient Compensations: Slow rate;Small sips/bites;Multiple dry swallows after each bite/sip;Follow solids with liquid Postural Changes and/or Swallow Maneuvers: Seated upright 90 degrees;Upright 30-60 min after meal              General recommendations: Rehab consult Oral Care Recommendations: Oral care Q4 per protocol Follow up  Recommendations: 24 hour supervision/assistance;Home health SLP Plan: Continue with current plan of care    GO     Maryjo Rochester T 03/02/2013, 4:11 PM

## 2013-03-02 NOTE — Progress Notes (Signed)
   Subjective: 1 Day Post-Op Procedure(s) (LRB): ARTHROPLASTY BIPOLAR HIP (Right)   Patient reports pain as moderate, appears to be decreasing. Using less pain medication. No new events throughout the night. Plan for swallowing study later today.   Objective:   VITALS:   Filed Vitals:   03/02/13  BP: 99/58  Pulse: 97  Temp: 99.3 F (37.4 C)  Resp: 16    Incision: dressing C/D/I No cellulitis present Compartment soft  LABS  Recent Labs  02/28/13 0630 03/01/13 0545 03/02/13 0403  HGB 9.1* 9.6* 8.2*  HCT 26.6* 28.4* 24.1*  WBC 5.5 13.2* 9.7  PLT 222 333 263     Recent Labs  02/28/13 0630 03/01/13 0545 03/02/13 0403  NA 131* 134* 135  K 3.0* 2.9* 2.9*  BUN 19 24* 30*  CREATININE 0.59 0.68 0.74  GLUCOSE 135* 188* 164*     Assessment/Plan: 1 Day Post-Op Procedure(s) (LRB): ARTHROPLASTY BIPOLAR HIP (Right)  Up with therapy Maintain surgical dressing for 12-14 days, or until follow up in the office. WBAT right leg    Kayla [name]. Kayla [name]   PAC  03/02/2013, 1:11 PM

## 2013-03-02 NOTE — Progress Notes (Signed)
ANTIBIOTIC CONSULT NOTE  Pharmacy Consult for levofloxacin Indication: Pneumonia, UTI  Allergies  Allergen Reactions  . Methylprednisolone Other (See Comments)    Pt states cannot take any steroids b/c they cause a decrease in her heart rate.  . Oxycodone-Acetaminophen     REACTION: unspecified  . Oxycodone-Acetaminophen     Rash head to toe  . Percocet [Oxycodone-Acetaminophen]     hallucinations  . Phenytoin     REACTION: rash  . Zanaflex [Tizanidine Hydrochloride]   . Phenytoin Sodium Extended Rash    Patient Measurements: Height: 5\' 3"  (160 cm) Weight: 139 lb 8.8 oz (63.3 kg) IBW/kg (Calculated) : 52.4   Vital Signs: Temp: 99.3 F (37.4 C) (12/10 1200) Temp src: Axillary (12/10 1200) BP: 99/58 mmHg (12/10 0900) Pulse Rate: 97 (12/10 0430)  Intake/Output from previous day: 12/09 0701 - 12/10 0700 In: 1638.9 [I.V.:1188.9; IV Piggyback:450] Out: 1575 [Urine:1500; Blood:75]  Labs:  Recent Labs  02/28/13 0630 03/01/13 0545 03/02/13 0403  WBC 5.5 13.2* 9.7  HGB 9.1* 9.6* 8.2*  PLT 222 333 263  CREATININE 0.59 0.68 0.74   Estimated Creatinine Clearance: 67.1 ml/min (by C-G formula based on Cr of 0.74).    Microbiology: 12/4 MRSA: negative 12/3 urine: 30K P.aeruginosa (S to all antipseudomonal agents tested) 12/5 blood x 2: NGtd 12/5 urine: 100k P.aeruginosa (Sens to all antipseudomonal agents tested) 12/5: Strep pneumo and Legionella antigens: negative   Anti-infectives:  12/5 >> Zosyn >>12/7  12/5 >> Zithromax >>12/7 12/5 >> Rocephin >> 12/5 12/5 >> vancomycin >> 12/9 12/7 >> cefepime>> 12/9 12/7 >> levofloxacin>>   Assessment: 62 y/o F with history multiple sclerosis and vocal cord paresis, on home CPAP, followed by Dr. Sherene Sires.  She was admitted 12/3 after falling and fracturing R hip.  On 12/5 empiric antibiotics (vancomycin, Zosyn, azithromycin) were started for HCAP, but were narrowed to Levaquin alone on 12/9.  Day #4 Levaquin  Tmax: 100,  Tc 99.3  WBCs: 9.7 (solumedrol)  Renal: SCr 0.74 stable, CrCl CG 67  Urine cultures on 12/3 and 12/5 grew Pseudomonas (sens to Levaquin)  Goal of Therapy:  Eradication of infection; Optimal dosing of antimicrobials  Plan:   Continue Levaquin 750mg  IV Q24h  Follow up renal fxn and culture results as available.   Lynann Beaver PharmD, BCPS Pager [phone] 03/02/2013 1:47 PM

## 2013-03-02 NOTE — Progress Notes (Signed)
PULMONARY  / CRITICAL CARE MEDICINE  Name: Kayla [name] MRN: [id number] DOB: 1951/03/12    ADMISSION DATE:  02/23/2013 CONSULTATION DATE:  02/24/2013  REFERRING MD :  [facility] PRIMARY SERVICE: PCCM  REASON FOR CONSULTATION:  Hypoxemia  BRIEF PATIENT DESCRIPTION:  62 yo with history of vocal cord paresis (Dr. Sherene Sires) admitted 12/03 with R hip fracture. Developed hypoxia and stridor 12/04 and transfer to ICU.  SIGNIFICANT EVENTS / STUDIES:  12/03  Admitted with R hip fx 12/04  Transferred to ICU with stridor / hypoxemia, ENT consulted 12/04  Laryngoscopy >>>  Bilateral vocal fold paresis bilaterally 12/06  TTE >>> EF 60, diastolic dysfunction, PAP 67 torr 12/07  Neurology consulted, steroids started 12/08  OR >>> canceled due to increased oxygen requirements 12/08  SLP >>> barium swallow study recommended 12/09  OR >>> R hip hemiarthroplasty  LINES / TUBES: R PICC  12/07 >>>  CULTURES: 12/04  MRSA PCR >>> neg 12/03  Urine >>> Pseudomonas (pansensetive) 12/05  Urine >>> Pseudomonas (pansensetive) 12/05  Blood >>>   ANTIBIOTICS: Rocephin 12/05 x 1 Zithromax 12/05 >>>12/07 Vancomycin 12/05 >>> 12/09 Zosyn 12/05 >>> 12/07 Cefepime 12/07 >>> 12/07 Levaquin 12/07 >>>  INTERVAL HISTORY:  OR last night with successful extubation in PACU.  VITAL SIGNS: Temp:  [97.3 F (36.3 C)-100 F (37.8 C)] 100 F (37.8 C) (12/10 0800) Pulse Rate:  [88-97] 97 (12/10 0430) Resp:  [13-20] 16 (12/10 0830) BP: (92-140)/(50-73) 110/61 mmHg (12/10 0800) SpO2:  [86 %-100 %] 94 % (12/10 0830) FiO2 (%):  [40 %-98 %] 40 % (12/10 0830) 40% FiO2  INTAKE / OUTPUT: Intake/Output     12/09 0701 - 12/10 0700 12/10 0701 - 12/11 0700   I.V. (mL/kg) 1188.9 (18.8)    IV Piggyback 450    Total Intake(mL/kg) 1638.9 (25.9)    Urine (mL/kg/hr) 1500 (1)    Blood 75 (0)    Total Output 1575     Net +63.9           PHYSICAL EXAMINATION: General: No distress, comfortable Neuro: Sleepy, but wakes up to  stimulation, follows commands HEENT: PERRL, no JVD, no stridor Cardiovascular: regular, no murmurs Lungs: Bilateral air entry, few rales Abdomen: Soft, non tender, bowel sounds present Ext: No edema  LABS: CBC  Recent Labs Lab 02/28/13 0630 03/01/13 0545 03/02/13 0403  WBC 5.5 13.2* 9.7  HGB 9.1* 9.6* 8.2*  HCT 26.6* 28.4* 24.1*  PLT 222 333 263   Coag's No results found for this basename: APTT, INR,  in the last 168 hours BMET  Recent Labs Lab 02/28/13 0630 03/01/13 0545 03/02/13 0403  NA 131* 134* 135  K 3.0* 2.9* 2.9*  CL 92* 93* 96  CO2 27 28 28   BUN 19 24* 30*  CREATININE 0.59 0.68 0.74  GLUCOSE 135* 188* 164*   Electrolytes  Recent Labs Lab 02/28/13 0630 03/01/13 0545 03/02/13 0403  CALCIUM 8.4 8.7 8.0*   Sepsis Markers  Recent Labs Lab 02/24/13 1748 02/25/13 1717 02/27/13 0350 03/01/13 0545  LATICACIDVEN 1.3 0.7  --   --   PROCALCITON  --  <0.10 0.14 <0.10   ABG  Recent Labs Lab 02/24/13 1625 02/26/13 1953  PHART 7.306* 7.437  PCO2ART 41.6 35.9  PO2ART 54.8* 41.9*   Liver Enzymes  Recent Labs Lab 02/25/13 1717 02/28/13 0630  AST 42* 23  ALT 18 13  ALKPHOS 62 49  BILITOT 0.3 0.3  ALBUMIN 2.9* 2.3*   Cardiac Enzymes  Recent Labs  Lab 02/25/13 0835  PROBNP 1654.0*   Glucose No results found for this basename: GLUCAP,  in the last 168 hours  PCX:  12/10 >>> Improved bilateral airspace disease  ASSESSMENT / PLAN:  PULMONARY A:  Acute hypoxemic respiratory failure. Suspected aspiration pneumonia, resolving. Vocal cord paralysis, at baseline.  History of reactive airway disease, but no acute bronchospasm.  OSA.  Pulmonary hypertension (PAP 67 torr). P:   Goal SpO2>92 Supplemental oxygen via face tent PRN, try on [place] BIPAP PRN Hold dulera in setting of possible pneumonia Albuterol / Atrovent  CARDIOVASCULAR A:  Acute on chronic diastolic dysfunction. P:  Goal MAP> 60  RENAL A:  Hypokalemia. Negative fluid  balance. P:   Goal neutral balance Trend BMP Change Lasix to 40 daily K 10 x 8 by eLink KVO  GASTROINTESTINAL A:  GERD. Constipation. Dysphagia. P:   Protonix ( preadmission PPI) Modified barium swallow today, if unable to perform or failed - start tube feeding  HEMATOLOGIC A:  Anemia of chronic disease. P:  Trend CBC Lovenox for DVT prevention Fe  INFECTIOUS A: Pseudomonas UTI. Possible HCAP. PCT (0.14) reassuring. P:   Abx / cultures as above D/c Vancomycin / Cefepime  ENDOCRINE A:   Hx of hypothyroidism. P:   Synthroid SSI  NEUROLOGIC A:  Hx of multiple sclerosis, likely flare up in setting of acute illness.  Pain. P:   PCA Prozac, Restoril, Baclofen, Neurontin Solu-Medrol completed  ORTHOPEDIC A:  R hip fracture, s/p hemiarthroplasty. P: Per Ortho  I have personally obtained history, examined patient, evaluated and interpreted laboratory and imaging results, reviewed medical records, formulated assessment / plan and placed orders.  Lonia Farber, MD Pulmonary and Critical Care Medicine [facility] Pager: [phone]  03/02/2013, 9:12 AM

## 2013-03-02 NOTE — Op Note (Signed)
NAMEPHALA, SCHRAEDER NO.:  [id number]  MEDICAL RECORD NO.:  [id number]  LOCATION:  1229                         FACILITY:  [facility]  PHYSICIAN:  Madlyn Frankel. Charlann Boxer, M.D.  DATE OF BIRTH:  March 26, 1950  DATE OF PROCEDURE:  03/01/2013 DATE OF DISCHARGE:                              OPERATIVE REPORT   PREOPERATIVE DIAGNOSIS:  Displaced right femoral neck fracture.  POSTOPERATIVE DIAGNOSIS: 1. Displaced right femoral neck fracture. 2. Multiple sclerosis. 3. Pneumonia. 4. Respiratory distress issues.  PROCEDURE:  Right hip hemiarthroplasty utilizing DePuy components size 4 high, Tri-Lock stem with a 43 unipolar ball and a +5 adapter.  SURGEON:  Madlyn Frankel. Charlann Boxer, M.D.  ASSISTANT:  Lanney Gins, PA-C.  ANESTHESIA:  General.  SPECIMENS:  None.  COMPLICATION:  None.  DRAINS:  None.  BLOOD LOSS:  Less than 100 mL.  INDICATION FOR PROCEDURE:  Ms. Ohnemus is a 62 year old female with a history of multiple sclerosis.  She has had a foot drop, and unfortunately stumbled over a carpet in the house.  She was brought to emergency room with a displaced femoral neck fracture.  Unfortunately related to her multiple sclerosis, she had respiratory decline while in the hospital requiring transition to intensive care unit.  This required IV and IV treatment for a diagnosed pneumonia perhaps aspiration maybe required initiating IV steroid therapy for multiple sclerosis due to this potential flare up.  There was lot of discussion with the intensive care unit myself with her family regarding the type of anesthesia and her history of anesthesia, the risk associated with her having surgery but the benefits of pain control mobility were felt to be very important in this case.  In fact, the surgery was canceled twice prior to getting scheduled for today.  The family were all agreement with the plan and understood the risks and benefits, consent was obtained for benefit of pain  relief.  PROCEDURE IN DETAIL:  The patient was brought to the operative theater. Once adequate anesthesia, preoperative antibiotics, Ancef in addition to the previously administered antibiotic treatment for pneumonia.  She was positioned into the left lateral decubitus position with the right side up.  Please note that she was intubated using a pediatric tube per the recommendations of ENT physician.  Once positioned, a time-out was performed identifying the patient, planned procedure, and extremity.  The right lower extremity was then prepped and draped in a sterile fashion.  A lateral based incision over the trochanter was identified and then the soft tissue dissection was carried down to the iliotibial band and gluteal fascia.  These were then incised for posterior approach to the hip.  The short external rotators were identified and taken down as a single layer with the capsule and preserved.  Fracture site was identified.  Femoral head was removed and measured to be 43 mm on the back table.  I then finished off the neck osteotomy into the trochanteric fossa.  At this point, the proximal femur was opened starting drill, hand reamed once and then irrigated to try to prevent fat emboli.  I broached starting with a 0 broach up to a size of 4 broach, maintained at approximately 20-25  degrees of anteversion.  With a 4 broach in place, a trial reduction was carried out 1st with the high offset with a standard neck and a high offset neck.  I thus selected this as the final stem. At this point, the size 4 high Tri-Lock stem was impacted and sat at the level of the broach.  I retrialed and selected the +5 adapter.  This seemed to be best reapproximate her leg lengths.  The +5 adapter was opened and impacted into the 43 unipolar ball.  The 2 were then impacted on a clean and dry trunnion.  The hip reduced.  The hip was irrigated throughout the case.  Again at this point, I reapproximated  the posterior capsule superior leaflet.  The iliotibial band and gluteal fascia were then reapproximated using #1 Vicryl and 0 V-Loc.  The remaining wound was closed with 2-0 Vicryl and a running 3-0 Monocryl. The hip was cleaned, dried, and dressed sterilely using Dermabond Aquacel dressing.  Plan was to try to extubate her in the operating room and have her then transferred to the intensive care unit for further observation. Findings were reviewed with family.     Madlyn Frankel Charlann Boxer, M.D.     MDO/MEDQ  D:  03/01/2013  T:  03/02/2013  Job:  [id number]

## 2013-03-02 NOTE — Progress Notes (Signed)
[facility] ADULT ICU REPLACEMENT PROTOCOL FOR AM LAB REPLACEMENT ONLY  The patient does apply for the [facility] Adult ICU Electrolyte Replacment Protocol based on the criteria listed below:   1. Is GFR >/= 40 ml/min? yes  Patient's GFR today is >90 2. Is urine output >/= 0.5 ml/kg/hr for the last 6 hours? yes Patient's UOP is 1.7 ml/kg/hr 3. Is BUN < 60 mg/dL? yes  Patient's BUN today is 30 4. Abnormal electrolyte(s): K 2.9 5. Ordered repletion with: per protocol 6. If a panic level lab has been reported, has the CCM MD in charge been notified? yes.   Physician:  Dr Pedro Earls, Jamisen Hawes A 03/02/2013 5:24 AM

## 2013-03-02 NOTE — Evaluation (Signed)
Occupational Therapy Evaluation Patient Details Name: Kayla [name] MRN: [id number] DOB: September 18, 1950 Today's Date: 03/02/2013 Time: 4098-1191 OT Time Calculation (min): 30 min  OT Assessment / Plan / Recommendation History of present illness Pt. admitted 12/4 after trip and fall, R subcapital hip fx. Began respiratory distress, surgery for hemiarthroplasty performed 03/01/13. Pt has h/o MS.   Clinical Impression   Pt was admitted for the above.  She has R THPS (posterior) and is WBAT.  Pt will benefit from skilled OT to maximize participation in adls and increase independence with mod A level goals overall.     OT Assessment  Patient needs continued OT Services    Follow Up Recommendations  SNF    Barriers to Discharge      Equipment Recommendations  3 in 1 bedside comode    Recommendations for Other Services    Frequency  Min 2X/week    Precautions / Restrictions Precautions Precautions: Fall;Posterior Hip Restrictions RLE Weight Bearing: Weight bearing as tolerated   Pertinent Vitals/Pain Pt denied pain: did not use PCA.  Was having visual hallucinations but knew nothing was there    ADL  Eating/Feeding: +1 Total assistance (beverage; daughter assisted).  Pt on Dys II diet. Where Assessed - Eating/Feeding: Edge of bed Transfers/Ambulation Related to ADLs: pt sat unsupported eob for 10 minutes.  Had swallow study earlier and was fatiqued but motivated and ready to get started.   ADL Comments: Educated on THPs and adl modifications.  Introduced Fish farm manager of AE but did not demonstrate on this visit.  Pt had bil tremor (slight present):  she states she can normally do what she wants to do.  At time of eval, total A for adls due to fatique.  Initially pt was unable to lift arms on her own but with a few repetitions of AAROM, she was able to lift R to 90 and L to 70.    OT Diagnosis: Generalized weakness  OT Problem List: Decreased strength;Decreased activity tolerance;Impaired  balance (sitting and/or standing);Decreased knowledge of use of DME or AE;Decreased knowledge of precautions;Pain OT Treatment Interventions: Self-care/ADL training;Therapeutic exercise;DME and/or AE instruction;Patient/family education;Balance training;Therapeutic activities   OT Goals(Current goals can be found in the care plan section) Acute Rehab OT Goals Patient Stated Goal: none stated.  agreeable to ot./pt OT Goal Formulation: With patient Time For Goal Achievement: 03/16/13 Potential to Achieve Goals: Good ADL Goals Pt Will Transfer to Toilet: with mod assist;bedside commode;stand pivot transfer Additional ADL Goal #1: pt will complete UB adls with set up from supported sitting Additional ADL Goal #2: Pt will go from sit to stand and maintain x 3 minutes for adls with mod A Additional ADL Goal #3: pt will verbalize vs demonstrate use of AE for adls and verbalize 3/3 THPs  Visit Information         Prior Functioning     Home Living Family/patient expects to be discharged to:: Unsure Living Arrangements: Spouse/significant other Additional Comments: pt has 4 ste, 1 level home, tub which she is in the process of converting to shower, and standard commode Prior Function Level of Independence: Independent with assistive device(s) Communication Communication: No difficulties         Vision/Perception     Cognition  Cognition Arousal/Alertness: Awake/alert Behavior During Therapy: WFL for tasks assessed/performed (visual hallucinations; knows not real) Overall Cognitive Status: Within Functional Limits for tasks assessed    Extremity/Trunk Assessment Upper Extremity Assessment Upper Extremity Assessment: Generalized weakness Able to lift RUE to 90  and LUE to 70 after AAROM.  Slight tremor bil    Mobility Bed Mobility Bed Mobility: Supine to Sit Supine to Sit: 1: +2 Total assist Supine to Sit: Patient Percentage: 0% Sit to Supine: 1: +2 Total assist Sit to  Supine: Patient Percentage: 0% Details for Bed Mobility Assistance: assist for trunk and bil LEs Transfers Transfers: Not assessed     Exercise     Balance Balance Balance Assessed: Yes Static Sitting Balance Static Sitting - Balance Support: Bilateral upper extremity supported Static Sitting - Level of Assistance: 5: Stand by assistance Static Sitting - Comment/# of Minutes: 10   End of Session OT - End of Session Activity Tolerance: Patient tolerated treatment well Patient left: in bed;with call bell/phone within reach;with family/visitor present Nurse Communication:  (activity tolerance)  GO     Nivek Powley 03/02/2013, 3:53 PM Marica Otter, OTR/L [phone] 03/02/2013

## 2013-03-02 NOTE — Progress Notes (Signed)
Bedside RN called and reported patient having difficulty swallowing. Changed potassium replacement to PICC route.

## 2013-03-03 LAB — BASIC METABOLIC PANEL
BUN: 25 mg/dL — ABNORMAL HIGH (ref 6–23)
CO2: 30 mEq/L (ref 19–32)
Calcium: 8.6 mg/dL (ref 8.4–10.5)
Chloride: 100 mEq/L (ref 96–112)
Creatinine, Ser: 0.58 mg/dL (ref 0.50–1.10)
GFR calc Af Amer: >90 mL/min (ref >90)
GFR calc non Af Amer: >90 mL/min (ref >90)
Glucose, Bld: 104 mg/dL — ABNORMAL HIGH (ref 70–99)
Potassium: 3.8 mEq/L (ref 3.5–5.1)
Sodium: 136 mEq/L (ref 135–145)

## 2013-03-03 LAB — CULTURE, BLOOD (ROUTINE X 2)
Culture: NO GROWTH
Culture: NO GROWTH

## 2013-03-03 LAB — CBC
HCT: 22.9 % — ABNORMAL LOW (ref 36.0–46.0)
Hemoglobin: 7.8 g/dL — ABNORMAL LOW (ref 12.0–15.0)
MCH: 29.5 pg (ref 26.0–34.0)
MCHC: 34.1 g/dL (ref 30.0–36.0)
MCV: 86.7 fL (ref 78.0–100.0)
Platelets: 259 10*3/uL (ref 150–400)
RBC: 2.64 MIL/uL — ABNORMAL LOW (ref 3.87–5.11)
RDW: 14.1 % (ref 11.5–15.5)
WBC: 10.9 10*3/uL — ABNORMAL HIGH (ref 4.0–10.5)

## 2013-03-03 MED ORDER — FUROSEMIDE 10 MG/ML IJ SOLN
20.0000 mg | Freq: Every day | INTRAMUSCULAR | Status: DC
  Administered 2013-03-04 – 2013-03-08 (×5): 20 mg via INTRAVENOUS
  Filled 2013-03-03 (×5): qty 2

## 2013-03-03 MED ORDER — MORPHINE SULFATE 2 MG/ML IJ SOLN
1.0000 mg | INTRAMUSCULAR | Status: DC | PRN
  Administered 2013-03-05: 1 mg via INTRAVENOUS
  Administered 2013-03-05: 2 mg via INTRAVENOUS
  Administered 2013-03-05: 1 mg via INTRAVENOUS
  Filled 2013-03-03 (×3): qty 1

## 2013-03-03 NOTE — Progress Notes (Signed)
Clinical Social Work Department CLINICAL SOCIAL WORK PLACEMENT NOTE 03/03/2013  Patient:  Kayla [name], Kayla [name]  Account Number:  [id number] Admit date:  02/23/2013  Clinical Social Worker:  Jacelyn Grip  Date/time:  03/03/2013 03:30 PM  Clinical Social Work is seeking post-discharge placement for this patient at the following level of care:   SKILLED NURSING   (*CSW will update this form in Epic as items are completed)   03/03/2013  Patient/family provided with [facility] Department of Clinical Social Work's list of facilities offering this level of care within the geographic area requested by the patient (or if unable, by the patient's family).  03/03/2013  Patient/family informed of their freedom to choose among providers that offer the needed level of care, that participate in Medicare, Medicaid or managed care program needed by the patient, have an available bed and are willing to accept the patient.  03/03/2013  Patient/family informed of [facility]' ownership interest in [facility], as well as of the fact that they are under no obligation to receive care at this facility.  PASARR submitted to EDS on 03/03/2013 PASARR number received from EDS on 03/03/2013  FL2 transmitted to all facilities in geographic area requested by pt/family on  03/03/2013 FL2 transmitted to all facilities within larger geographic area on   Patient informed that his/her managed care company has contracts with or will negotiate with  certain facilities, including the following:     Patient/family informed of bed offers received:   Patient chooses bed at  Physician recommends and patient chooses bed at    Patient to be transferred to  on   Patient to be transferred to facility by   The following physician request were entered in Epic:   Additional Comments: Pt spouse reports that pt family would ultimately prefer for pt to d/c home, but agreeable to SNF search as secondary option in  order to have all options available.   Jacklynn Lewis, MSW, LCSWA  Clinical Social Work [phone]

## 2013-03-03 NOTE — Progress Notes (Signed)
PULMONARY  / CRITICAL CARE MEDICINE  Name: Kayla [name] MRN: [id number] DOB: 10-23-1950    ADMISSION DATE:  02/23/2013 CONSULTATION DATE:  02/24/2013  REFERRING MD :  [facility] PRIMARY SERVICE: PCCM  REASON FOR CONSULTATION:  Hypoxemia  BRIEF PATIENT DESCRIPTION:  61 yo with history of vocal cord paresis (Dr. Sherene Sires) admitted 12/03 with R hip fracture. Developed hypoxia and stridor 12/04 and transfer to ICU.  SIGNIFICANT EVENTS / STUDIES:  12/03  Admitted with R hip fx 12/04  Transferred to ICU with stridor / hypoxemia, ENT consulted 12/04  Laryngoscopy >>>  Bilateral vocal fold paresis bilaterally 12/06  TTE >>> EF 60, diastolic dysfunction, PAP 67 torr 12/07  Neurology consulted, steroids started 12/08  OR >>> canceled due to increased oxygen requirements 12/08  SLP >>> barium swallow study recommended 12/09  OR >>> R hip hemiarthroplasty 12/10  Modified barium swallow >>> Dysphagia 2 diet, thin liquid  LINES / TUBES: R PICC  12/07 >>>  CULTURES: 12/04  MRSA PCR >>> neg 12/03  Urine >>> Pseudomonas (pansensetive) 12/05  Urine >>> Pseudomonas (pansensetive) 12/05  Blood >>> neg  ANTIBIOTICS: Rocephin 12/05 x 1 Zithromax 12/05 >>>12/07 Vancomycin 12/05 >>> 12/09 Zosyn 12/05 >>> 12/07 Cefepime 12/07 >>> 12/07 Levaquin 12/07 >>>  INTERVAL HISTORY:  Minimal post op pain. Wants to go home. Family reports periodic confusion.  VITAL SIGNS: Temp:  [98.2 F (36.8 C)-98.5 F (36.9 C)] 98.2 F (36.8 C) (12/11 0800) Resp:  [15-24] 21 (12/11 1400) BP: (102-142)/(55-84) 137/69 mmHg (12/11 1400) SpO2:  [93 %-100 %] 100 % (12/11 1400) FiO2 (%):  [50 %-98 %] 98 % (12/11 0430) 40% FiO2  INTAKE / OUTPUT: Intake/Output     12/10 0701 - 12/11 0700 12/11 0701 - 12/12 0700   I.V. (mL/kg) 52 (0.8) 260 (4.1)   IV Piggyback 150 150   Total Intake(mL/kg) 202 (3.2) 410 (6.5)   Urine (mL/kg/hr) 1175 (0.8) 1700 (3.7)   Blood     Total Output 1175 1700   Net -973 -1290         PHYSICAL  EXAMINATION: General: sitting in the chair, no distress, comfortable Neuro: Awake, alert, cooperative  HEENT: No stridor Cardiovascular: RRR, no murmurs Lungs: Bilateral air entry, no added sounds Abdomen: Soft, non tender, bowel sounds present Ext: No edema  LABS: CBC  Recent Labs Lab 03/01/13 0545 03/02/13 0403 03/03/13 0530  WBC 13.2* 9.7 10.9*  HGB 9.6* 8.2* 7.8*  HCT 28.4* 24.1* 22.9*  PLT 333 263 259   Coag's No results found for this basename: APTT, INR,  in the last 168 hours BMET  Recent Labs Lab 03/02/13 0403 03/02/13 2230 03/03/13 0530  NA 135 136 136  K 2.9* 4.3 3.8  CL 96 99 100  CO2 28 29 30   BUN 30* 27* 25*  CREATININE 0.74 0.69 0.58  GLUCOSE 164* 129* 104*   Electrolytes  Recent Labs Lab 03/02/13 0403 03/02/13 2230 03/03/13 0530  CALCIUM 8.0* 8.6 8.6   Sepsis Markers  Recent Labs Lab 02/24/13 1748 02/25/13 1717 02/27/13 0350 03/01/13 0545  LATICACIDVEN 1.3 0.7  --   --   PROCALCITON  --  <0.10 0.14 <0.10   ABG  Recent Labs Lab 02/24/13 1625 02/26/13 1953  PHART 7.306* 7.437  PCO2ART 41.6 35.9  PO2ART 54.8* 41.9*   Liver Enzymes  Recent Labs Lab 02/25/13 1717 02/28/13 0630  AST 42* 23  ALT 18 13  ALKPHOS 62 49  BILITOT 0.3 0.3  ALBUMIN 2.9* 2.3*  Cardiac Enzymes  Recent Labs Lab 02/25/13 0835  PROBNP 1654.0*   Glucose No results found for this basename: GLUCAP,  in the last 168 hours  PCX:  None today  ASSESSMENT / PLAN:  PULMONARY A:  Acute hypoxemic respiratory failure. Suspected aspiration pneumonia, resolving. Vocal cord paralysis, at baseline.  History of reactive airway disease, but no acute bronchospasm.  OSA.  Pulmonary hypertension (PAP 67 torr). P:   Goal SpO2>92 Supplemental oxygen, wean as tolerated BIPAP PRN Hold dulera in setting of possible pneumonia Albuterol / Atrovent  CARDIOVASCULAR A:  Acute on chronic diastolic dysfunction. P:  Goal MAP> 60  RENAL A:  Mild hypokalemia.  Stay neg 3.8L, day neg 1.3L. P:   Trend BMP Decrease Lasix to 20 daily  GASTROINTESTINAL A:  GERD. Constipation. Dysphagia. Protein calorie malnutrition. P:   Protonix ( preadmission PPI) Dysphagia 2, thin liquid diet Nutritionist to evaluate for supplements  HEMATOLOGIC A:  Anemia of chronic disease / acute blood loss. P:  Trend CBC Lovenox for DVT prevention Fe  INFECTIOUS A: Pseudomonas UTI. Possible HCAP. PCT (0.14) reassuring. P:   Abx / cultures as above   ENDOCRINE A:  Hypothyroidism. P:   Synthroid  NEUROLOGIC A:  Hx of multiple sclerosis, likely flare up in setting of acute illness.  Pain.  Possible delirium. P:   D/c PCA Start Morphine PRN Prozac, Restoril, Baclofen, Neurontin Solu-Medrol completed  ORTHOPEDIC A:  R hip fracture, s/p hemiarthroplasty. P: Per Ortho PT/OT  I have personally obtained history, examined patient, evaluated and interpreted laboratory and imaging results, reviewed medical records, formulated assessment / plan and placed orders.  Lonia Farber, MD Pulmonary and Critical Care Medicine [facility] Pager: [phone]  03/03/2013, 2:12 PM

## 2013-03-03 NOTE — Progress Notes (Signed)
Speech Language Pathology Treatment: Dysphagia  Patient Details Name: Kayla [name] MRN: [id number] DOB: 06-18-1950 Today's Date: 03/03/2013 Time: 4098-1191 SLP Time Calculation (min): 45 min  Assessment / Plan / Recommendation Clinical Impression  Pt has taken minimal po today.  She reports significant and uncomfortable dry mouth.  Pt tries to assist with cup sips, but has difficulty holding cup, bringing it to lips, and then closing lips around the cup brim.  Anterior loss is frequent.  Family reports pt has taken only bites of yogurt and pudding, and based on my observations, is really not capable of meeting nutrition/hydration needs po at this point in time. Discussed with RN, and recommended consult with dietician for supplements or nonoral means until pt is more appropriate for total reliance on po intake.  Pt/family were given oral swabs, biotene, and oral gel to improve mouth moisture. Family was encouraged to throw swabs away after use, not allow them to sit in water.  Safe swallow precautions posted at [facility], and reviewed with pt/family.  ST to continue to follow for diet tolerance and pt/family education.   HPI HPI: RICK WARNICK is a 62 y.o. female with PMH of relapsing MS followed by Dr.Douglas, with foot drop, Hypothyrodiism, osteoporosis.  Pt was walking 02/23/13,  tripped and fell due to a rug, and subsequently started having R hip pain and was unable to ambulate.  EMS was called and brought pt to [facility] ER.  She was found to have a  Displaced right subcapital femoral neck fracture. MBS was completed 03/02/13, with recommendation for dys 2 diet with thin liquids.   Pertinent Vitals VSS  SLP Plan  Continue with current plan of care    Recommendations Diet recommendations: Dysphagia 2 (fine chop);Thin liquid Liquids provided via: Cup;No straw Medication Administration: Crushed with puree Supervision: Staff to assist with self feeding;Full supervision/cueing for compensatory  strategies;Trained caregiver to feed patient Compensations: Slow rate;Small sips/bites;Multiple dry swallows after each bite/sip;Follow solids with liquid Postural Changes and/or Swallow Maneuvers: Seated upright 90 degrees;Upright 30-60 min after meal              General recommendations: Rehab consult Oral Care Recommendations: Oral care Q4 per protocol Follow up Recommendations: 24 hour supervision/assistance;Home health SLP Plan: Continue with current plan of care    GO    Erickson Yamashiro B. [place], [facility], CCC-SLP [phone]  Kayla [name] 03/03/2013, 4:36 PM

## 2013-03-03 NOTE — Progress Notes (Signed)
Patient ID: Kayla [name], female   DOB: 11/20/1950, 62 y.o.   MRN: [id number] Subjective: 2 Days Post-Op Procedure(s) (LRB): ARTHROPLASTY BIPOLAR HIP (Right)    Patient reports pain as mild to moderate but significantly better relating to use of IV morphine. Sitting in chair after therapy walked her a bit Ready to go home - I bet  Objective:   VITALS:   Filed Vitals:   03/03/13 1200  BP: 127/74  Pulse:   Temp:   Resp: 18    Neurovascular stable with prior history of foot drop from MS Incision: dressing C/D/I  LABS  Recent Labs  03/01/13 0545 03/02/13 0403 03/03/13 0530  HGB 9.6* 8.2* 7.8*  HCT 28.4* 24.1* 22.9*  WBC 13.2* 9.7 10.9*  PLT 333 263 259     Recent Labs  03/02/13 0403 03/02/13 2230 03/03/13 0530  NA 135 136 136  K 2.9* 4.3 3.8  BUN 30* 27* 25*  CREATININE 0.74 0.69 0.58  GLUCOSE 164* 129* 104*    No results found for this basename: LABPT, INR,  in the last 72 hours   Assessment/Plan: 2 Days Post-Op Procedure(s) (LRB): ARTHROPLASTY BIPOLAR HIP (Right)   Advance diet Plan for discharge pending further medical stabilization and assessments from PT - most likely SNF  Will follow

## 2013-03-03 NOTE — Progress Notes (Signed)
CSW continuing to follow for disposition planning.  CSW reviewed chart and noted that PT/OT have begun treatment with pt and recommendation at this time is for SNF.   CSW met with pt and pt daughter at bedside.   CSW introduced self and explained role.  CSW discussed that given recommendations from PT/OT at this time the recommendation is for rehab at [facility].   CSW clarified pt and pt daughters questions. Pt daughter discussed that pt and pt spouse live here in [place].   Pt agreeable to SNF search in [facility].   CSW provided [facility] list and this CSW contact information as pt daughter mentioned that pt spouse may want to speak with this CSW.   CSW received phone call from pt spouse shortly after leaving pt room.  Pt spouse discussed that pt and pt spouse have had a bad experience at one SNF in the past that a family member of the pt was at. Pt spouse discussed that pt and pt spouse are very hopeful that pt will be able to return home. Pt spouse discussed that pt has many family members and friends that would be able to provide 24 hour care to pt. Pt spouse feels that pt would be more satisfied at home.  CSW discussed with pt spouse re: exploring SNF option as a secondary option. Pt spouse agreeable to this and feels that exploring both options home and SNF options will be beneficial.  Pt spouse appears very supportive and actively involved with pt care.   CSW completed FL2 and initiated SNF search to [facility].   CSW notified RNCM of pt family wishes to also explore home option.   CSW to continue to follow and assist with pt disposition needs.   Jacklynn Lewis, MSW, LCSWA  Clinical Social Work [phone]

## 2013-03-03 NOTE — Progress Notes (Signed)
9mL Morphine PCA wasted in sink by Iran Ouch, RN and Oneita Hurt, RN.  Morphine PCA has been discontinued.

## 2013-03-03 NOTE — Progress Notes (Signed)
Physical Therapy Treatment Patient Details Name: Kayla [name] MRN: [id number] DOB: 1950-08-17 Today's Date: 03/03/2013 Time: 3086-5784 PT Time Calculation (min): 30 min  PT Assessment / Plan / Recommendation  History of Present Illness Pt. admitted 12/4 after trip and fall, R subcapital hip fx. Began respiratory distress, surgery for hemiarthroplasty performed 03/01/13. Pt has h/o MS.   PT Comments   POD # 2 R Hemiarthroplasty 2nd Fx WBAT THP with Hx MS.  Pt in bed on 6lts nasal @ 100%.  Very groggy/sleepy but does respond to questions and commands.  Oriented correctly to place and situation.  Sister Kayla [name] in room and also assist by pushing IV Pole and recliner during gait as pt required + 2 total assist with limited distance of 5 feet this session.  Positioned pt in recliner and applied ICE pack to R hip. Instructed pt on WBAT.  Hung THP precautions sign in room.   Follow Up Recommendations  SNF     Does the patient have the potential to tolerate intense rehabilitation     Barriers to Discharge        Equipment Recommendations  Rolling walker with 5" wheels    Recommendations for Other Services    Frequency Min 3X/week   Progress towards PT Goals Progress towards PT goals: Progressing toward goals  Plan      Precautions / Restrictions Precautions Precautions: Fall;Posterior Hip Precaution Comments: THP sign hung in room Restrictions Weight Bearing Restrictions: No RLE Weight Bearing: Weight bearing as tolerated    Pertinent Vitals/Pain Supine: BP 127/68, RR 24, HR 90, O2 100% on 6 lts EOB: BP 138/74, RR 25, HR 96, O2 100% on 6 lts After amb: BP 120/80, RR 28, HR 89, O2 100% on 6 lts    Mobility  Bed Mobility Bed Mobility: Supine to Sit Supine to Sit: 1: +2 Total assist Supine to Sit: Patient Percentage: 10% Details for Bed Mobility Assistance: HOB elevated and increased time.  Used pad to swival hips around to EOB. Transfers Transfers: Sit to Stand;Stand to  Sit Sit to Stand: 1: +2 Total assist Sit to Stand: Patient Percentage: 20% Stand to Sit: 1: +2 Total assist Stand to Sit: Patient Percentage: 20% Details for Transfer Assistance: 50% VC's on proper tech and to adhere to THP.  Increased time and assist for controlled decend to recliner. Ambulation/Gait Ambulation/Gait Assistance: 1: +2 Total assist Ambulation Distance (Feet): 5 Feet Assistive device: Rolling walker Ambulation/Gait Assistance Details: 50% VC's on proper walker to self distance and extra assist to maintain upright posture.  Sister Kayla [name] assisted by following with recliner. Pt on 6lts nasal @ 100%.  Gait Pattern: Step-to pattern;Decreased stance time - right Gait velocity: decreased     PT Goals (current goals can now be found in the care plan section)    Visit Information  Last PT Received On: 03/03/13 Assistance Needed: +2 History of Present Illness: Pt. admitted 12/4 after trip and fall, R subcapital hip fx. Began respiratory distress, surgery for hemiarthroplasty performed 03/01/13. Pt has h/o MS.    Subjective Data      Cognition       Balance     End of Session PT - End of Session Equipment Utilized During Treatment: Gait belt Activity Tolerance: Patient limited by fatigue Patient left: in chair;with call bell/phone within reach;with family/visitor present   Felecia Shelling  PTA [facility]  Acute  Rehab Pager      [phone]

## 2013-03-04 DIAGNOSIS — S72009A Fracture of unspecified part of neck of unspecified femur, initial encounter for closed fracture: Secondary | ICD-10-CM

## 2013-03-04 DIAGNOSIS — W19XXXA Unspecified fall, initial encounter: Secondary | ICD-10-CM

## 2013-03-04 LAB — BASIC METABOLIC PANEL
BUN: 22 mg/dL (ref 6–23)
CO2: 28 mEq/L (ref 19–32)
Calcium: 8.5 mg/dL (ref 8.4–10.5)
Chloride: 97 mEq/L (ref 96–112)
Creatinine, Ser: 0.56 mg/dL (ref 0.50–1.10)
GFR calc Af Amer: >90 mL/min (ref >90)
GFR calc non Af Amer: >90 mL/min (ref >90)
Glucose, Bld: 92 mg/dL (ref 70–99)
Potassium: 3.2 mEq/L — ABNORMAL LOW (ref 3.5–5.1)
Sodium: 135 mEq/L (ref 135–145)

## 2013-03-04 LAB — CBC
HCT: 22.9 % — ABNORMAL LOW (ref 36.0–46.0)
Hemoglobin: 7.6 g/dL — ABNORMAL LOW (ref 12.0–15.0)
MCH: 28.6 pg (ref 26.0–34.0)
MCHC: 33.2 g/dL (ref 30.0–36.0)
MCV: 86.1 fL (ref 78.0–100.0)
Platelets: 259 10*3/uL (ref 150–400)
RBC: 2.66 MIL/uL — ABNORMAL LOW (ref 3.87–5.11)
RDW: 14.1 % (ref 11.5–15.5)
WBC: 13.6 10*3/uL — ABNORMAL HIGH (ref 4.0–10.5)

## 2013-03-04 LAB — TYPE AND SCREEN
ABO/RH(D): O POS
Antibody Screen: NEGATIVE
Unit division: 0
Unit division: 0

## 2013-03-04 MED ORDER — BOOST / RESOURCE BREEZE PO LIQD
1.0000 | Freq: Three times a day (TID) | ORAL | Status: DC
  Administered 2013-03-05 – 2013-03-08 (×8): 1 via ORAL

## 2013-03-04 MED ORDER — POTASSIUM CHLORIDE 10 MEQ/50ML IV SOLN
10.0000 meq | INTRAVENOUS | Status: AC
  Administered 2013-03-04 (×6): 10 meq via INTRAVENOUS
  Filled 2013-03-04 (×5): qty 50

## 2013-03-04 MED ORDER — ASPIRIN EC 325 MG PO TBEC: 325.0000 mg | DELAYED_RELEASE_TABLET | Freq: Two times a day (BID) | ORAL | Status: DC

## 2013-03-04 MED ORDER — HYDROCODONE-ACETAMINOPHEN 7.5-325 MG PO TABS: 1.0000 | ORAL_TABLET | ORAL | Status: DC | PRN

## 2013-03-04 MED ORDER — POTASSIUM CHLORIDE 20 MEQ/15ML (10%) PO LIQD: 60.0000 meq | Freq: Once | ORAL | Status: DC

## 2013-03-04 MED ORDER — FERROUS SULFATE 325 (65 FE) MG PO TABS: 325.0000 mg | ORAL_TABLET | Freq: Three times a day (TID) | ORAL | Status: DC

## 2013-03-04 MED ORDER — RISPERIDONE 0.25 MG PO TABS
0.2500 mg | ORAL_TABLET | Freq: Every day | ORAL | Status: DC
  Administered 2013-03-04 – 2013-03-07 (×4): 0.25 mg via ORAL
  Filled 2013-03-04 (×5): qty 1

## 2013-03-04 NOTE — Progress Notes (Signed)
Physical Therapy Treatment Patient Details Name: Kayla [name] MRN: [id number] DOB: Aug 06, 1950 Today's Date: 03/04/2013 Time: 7846-9629 PT Time Calculation (min): 23 min  PT Assessment / Plan / Recommendation  History of Present Illness Pt. admitted 12/4 after trip and fall, R subcapital hip fx. Began respiratory distress, surgery for hemiarthroplasty performed 03/01/13. Pt has h/o MS.   PT Comments   Pt improved in assisting self to edge of bed. Continues to be drowsy but does follow and participate. Spouse present. Recommend CIR consult.   Follow Up Recommendations  CIR     Does the patient have the potential to tolerate intense rehabilitation     Barriers to Discharge        Equipment Recommendations       Recommendations for Other Services Rehab consult  Frequency     Progress towards PT Goals Progress towards PT goals: Progressing toward goals  Plan Discharge plan needs to be updated    Precautions / Restrictions Precautions Precautions: Fall;Posterior Hip Precaution Comments: THP sign hung in room Restrictions Weight Bearing Restrictions: No RLE Weight Bearing: Weight bearing as tolerated   Pertinent Vitals/Pain Sats > 90 on 3 l. For activity. Pt on 2 l pre/post.  HR 113 max  noted breathing through mouth with stridor- like sounds. Encouraged Pursed lip breaths.    Mobility  Bed Mobility Supine to Sit: 1: +2 Total assist Supine to Sit: Patient Percentage: 30% Details for Bed Mobility Assistance: HOB elevated and increased time. Pt did  move each leg with assistance to get legs to edge, pulled up on Rail on R and used l arm to pull up to sitting , able to scoot to edge of bed and back into recliner. using arms on recliner arm rests,. Transfers Sit to Stand: 1: +2 Total assist;From bed;With upper extremity assist Sit to Stand: Patient Percentage: 40% Stand to Sit: 1: +2 Total assist;To chair/3-in-1;With upper extremity assist Details for Transfer Assistance: 50%  VC's on proper tech and to adhere to THP.  Increased time and assist for controlled decend to recliner.  Ambulation/Gait Ambulation/Gait Assistance: 1: +2 Total assist Ambulation Distance (Feet): 5 Feet Assistive device: Rolling walker Ambulation/Gait Assistance Details: cues to keep eyes open. Pt with noted bil. foot drop,  Gait Pattern: Step-to pattern;Decreased stance time - right;Right steppage;Left steppage    Exercises     PT Diagnosis:    PT Problem List:   PT Treatment Interventions:     PT Goals (current goals can now be found in the care plan section)    Visit Information  Last PT Received On: 03/04/13 Assistance Needed: +2 History of Present Illness: Pt. admitted 12/4 after trip and fall, R subcapital hip fx. Began respiratory distress, surgery for hemiarthroplasty performed 03/01/13. Pt has h/o MS.    Subjective Data      Cognition  Cognition Arousal/Alertness: Lethargic Behavior During Therapy: WFL for tasks assessed/performed Overall Cognitive Status: Impaired/Different from baseline    Balance  Static Sitting Balance Static Sitting - Balance Support: Bilateral upper extremity supported Static Sitting - Level of Assistance: 5: Stand by assistance  End of Session PT - End of Session Activity Tolerance: Patient limited by fatigue;Patient limited by lethargy Patient left: in chair;with call bell/phone within reach;with family/visitor present Nurse Communication: Mobility status   GP     Rada Hay 03/04/2013, 9:52 AM Blanchard Kelch PT [phone]

## 2013-03-04 NOTE — Progress Notes (Signed)
ANTIBIOTIC CONSULT NOTE - follow up  Pharmacy Consult for levofloxacin Indication: Pneumonia, UTI  Allergies  Allergen Reactions  . Methylprednisolone Other (See Comments)    Pt states cannot take any steroids b/c they cause a decrease in her heart rate.  . Oxycodone-Acetaminophen     REACTION: unspecified  . Oxycodone-Acetaminophen     Rash head to toe  . Percocet [Oxycodone-Acetaminophen]     hallucinations  . Phenytoin     REACTION: rash  . Zanaflex [Tizanidine Hydrochloride]   . Phenytoin Sodium Extended Rash    Patient Measurements: Height: 5\' 3"  (160 cm) Weight: 139 lb 8.8 oz (63.3 kg) IBW/kg (Calculated) : 52.4   Vital Signs: Temp: 98.6 F (37 C) (12/11 2351) Temp src: Axillary (12/11 2351) BP: 134/58 mmHg (12/12 0900)  Intake/Output from previous day: 12/11 0701 - 12/12 0700 In: 1090 [I.V.:940; IV Piggyback:150] Out: 2035 [Urine:2035]  Labs:  Recent Labs  03/02/13 0403 03/02/13 2230 03/03/13 0530 03/04/13 0445  WBC 9.7  --  10.9* 13.6*  HGB 8.2*  --  7.8* 7.6*  PLT 263  --  259 259  CREATININE 0.74 0.69 0.58 0.56   Estimated Creatinine Clearance: 67.1 ml/min (by C-G formula based on Cr of 0.56).    Microbiology: 12/4 MRSA: negative 12/3 urine: 30K P.aeruginosa (S to all antipseudomonal agents tested) 12/5 blood x 2: negative 12/5 urine: 100k P.aeruginosa (Sens to all antipseudomonal agents tested) 12/5: Strep pneumo and Legionella antigens: negative  Anti-infectives:  12/5 >> Zosyn >>12/7  12/5 >> Zithromax >>12/7 12/5 >> Rocephin >> 12/5 12/5 >> Vancomycin >> 12/9 12/7 >> Cefepime >> 12/9 12/7 >> Levofloxacin >>  Assessment: 62 y/o F with history multiple sclerosis and vocal cord paresis, on home CPAP, followed by Dr. Sherene Sires.  She was admitted 12/3 after falling and fracturing R hip.  On 12/5 empiric antibiotics (vancomycin, Zosyn, azithromycin) were started for HCAP, but were narrowed to Levaquin alone on 12/9.  Levaquin IV, day #6 of  ?  Afebrile.  WBCs slightly elevated. Off steroid.  SCr wnl/stable. CrCl 67CG  PO meds charted but per RN, she is having difficulty swallowing them.  Goal of Therapy:  Eradication of infection; Optimal dosing of antimicrobials  Plan:   Continue Levaquin 750mg  IV Q24h. What is the planned duration?  Dose-adjustments not likely to be necessary so we will sign-off.   Charolotte Eke, PharmD, pager [phone]. 03/04/2013,9:46 AM.

## 2013-03-04 NOTE — Progress Notes (Signed)
Physical Therapy Treatment Patient Details Name: Kayla [name] MRN: [id number] DOB: June 01, 1950 Today's Date: 03/04/2013 Time: 4098-1191 PT Time Calculation (min): 25 min  PT Assessment / Plan / Recommendation  History of Present Illness Pt. admitted 12/4 after trip and fall, R subcapital hip fx. Began respiratory distress, surgery for hemiarthroplasty performed 03/01/13. Pt has h/o MS.   PT Comments   Pt continues to make slow steady progress. Pt could benefit from Bilateral AFO's to facilitate stepping/ambulation. Will plan to use ace wraps to both fore feet to support dorsiflexion after spouse brings pt shoes.   Follow Up Recommendations  CIR     Does the patient have the potential to tolerate intense rehabilitation     Barriers to Discharge        Equipment Recommendations  Rolling walker with 5" wheels    Recommendations for Other Services Rehab consult  Frequency Min 3X/week   Progress towards PT Goals Progress towards PT goals: Progressing toward goals  Plan Current plan remains appropriate    Precautions / Restrictions Precautions Precautions: Fall;Posterior Hip Precaution Comments: THP sign hung in room Restrictions RLE Weight Bearing: Weight bearing as tolerated   Pertinent Vitals/Pain Pt denies any pain in R hip.    Mobility  Bed Mobility Rolling Left: 1: +2 Total assist (pillow between legs.) Rolling Left: Patient Percentage: 10% Supine to Sit: 1: +2 Total assist Supine to Sit: Patient Percentage: 30% Sitting - Scoot to Edge of Bed: 3: Mod assist Sit to Supine: 1: +2 Total assist Sit to Supine: Patient Percentage: 10% Details for Bed Mobility Assistance: extra time for breathing/rest break.cues to use UE's to scoot to edge of chair. R leg position. Transfers Transfers: Stand Pivot Transfers Sit to Stand: 1: +2 Total assist;With upper extremity assist;From chair/3-in-1 (x 2) Sit to Stand: Patient Percentage: 40% Stand to Sit: 1: +2 Total assist;To  chair/3-in-1;With upper extremity assist Stand to Sit: Patient Percentage: 40% Details for Transfer Assistance:   VC's on proper technique and to adhere to THP.  Increased time and assist for controlled decent to recliner.  cues for posture to facilitate taking a step. Pt has difficulty due to foot drop bilaterally.  Assistive device: Rolling walker Ambulation/Gait Assistance Details: cues to keep eyes open. Pt with noted bil. foot drop,  Gait Pattern: Step-to pattern;Decreased stance time - right;Right steppage;Left steppage    Exercises Total Joint Exercises Ankle Circles/Pumps: AAROM;Both;10 reps;Supine Short Arc Quad: AROM;Both;10 reps;Supine Heel Slides: AAROM;Both;10 reps;Supine Hip ABduction/ADduction: AAROM;Both;10 reps;Supine   PT Diagnosis:    PT Problem List:   PT Treatment Interventions:     PT Goals (current goals can now be found in the care plan section)    Visit Information  Last PT Received On: 03/04/13 Assistance Needed: +2 PT/OT/SLP Co-Evaluation/Treatment: Yes Reason for Co-Treatment: Complexity of the patient's impairments (multi-system involvement);For patient/therapist safety PT goals addressed during session: Mobility/safety with mobility;Proper use of DME;Strengthening/ROM;Balance History of Present Illness: Pt. admitted 12/4 after trip and fall, R subcapital hip fx. Began respiratory distress, surgery for hemiarthroplasty performed 03/01/13. Pt has h/o MS.    Subjective Data      Cognition  Cognition Arousal/Alertness: Lethargic Behavior During Therapy: WFL for tasks assessed/performed Overall Cognitive Status: Impaired/Different from baseline    Balance  Static Sitting Balance Static Sitting - Balance Support: Bilateral upper extremity supported Static Sitting - Level of Assistance: 5: Stand by assistance  End of Session PT - End of Session Activity Tolerance: Patient tolerated treatment well Patient left: in bed;with  call bell/phone within  reach;with family/visitor present Nurse Communication: Mobility status   GP     Rada Hay 03/04/2013, 1:12 PM

## 2013-03-04 NOTE — Progress Notes (Signed)
Occupational Therapy Treatment Patient Details Name: Kayla [name] MRN: [id number] DOB: [birth year] Today's Date: 03/04/2013 Time: 6213-0865 OT Time Calculation (min): 26 min  OT Assessment / Plan / Recommendation  History of present illness Pt. admitted 12/4 after trip and fall, R subcapital hip fx. Began respiratory distress, surgery for hemiarthroplasty performed 03/01/13. Pt has h/o MS.   OT comments  Pt sleepy but very motivated.  Would benefit from CIR as activity tolerance improves.    Follow Up Recommendations  CIR    Barriers to Discharge       Equipment Recommendations  3 in 1 bedside comode    Recommendations for Other Services Rehab consult  Frequency Min 2X/week   Progress towards OT Goals Progress towards OT goals: Progressing toward goals  Plan Discharge plan needs to be updated    Precautions / Restrictions Precautions Precautions: Fall;Posterior Hip Precaution Comments: THP sign hung in room Restrictions RLE Weight Bearing: Weight bearing as tolerated   Pertinent Vitals/Pain No c/o pain    ADL  Upper Body Bathing: Set up Where Assessed - Upper Body Bathing: Supported sitting Toilet Transfer: Simulated;+2 Total assistance Toilet Transfer: Patient Percentage: 40% Statistician Method: Surveyor, minerals:  (chair to bed) Transfers/Ambulation Related to ADLs: sit to stand with A x 2, pt 40%.   ADL Comments: pt sleepy:  kept eyes closed, but willing to work with OT.  Did not introduce AE on this visit due to eyes being closed but reviewed thps    OT Diagnosis:    OT Problem List:   OT Treatment Interventions:     OT Goals(current goals can now be found in the care plan section)    Visit Information  Last OT Received On: 03/04/13 Assistance Needed: +2 PT/OT/SLP Co-Evaluation/Treatment:  (overlapped) Reason for Co-Treatment: Complexity of the patient's impairments (multi-system involvement) PT goals addressed during session:  Mobility/safety with mobility;Proper use of DME;Strengthening/ROM;Balance OT goals addressed during session: ADL's and self-care History of Present Illness: Pt. admitted 12/4 after trip and fall, R subcapital hip fx. Began respiratory distress, surgery for hemiarthroplasty performed 03/01/13. Pt has h/o MS.    Subjective Data      Prior Functioning       Cognition  Cognition Arousal/Alertness: Lethargic Behavior During Therapy: WFL for tasks assessed/performed    Mobility  Bed Mobility Bed motility Sit to Supine: 1: +2 Total assist Sit to Supine: Patient Percentage: 10% Details for Bed Mobility Assistance: extra time for breathing/rest break.cues to use UE's to scoot to edge of chair. R leg position. Transfers Sit to Stand: 1: +2 Total assist;With upper extremity assist;From chair/3-in-1 Sit to Stand: Patient Percentage: 40% Stand to Sit: 1: +2 Total assist;To chair/3-in-1;With upper extremity assist Stand to Sit: Patient Percentage: 40% Details for Transfer Assistance:   VC's on proper technique and to adhere to THP.  Increased time and assist for controlled decent to recliner.  cues for posture to facilitate taking a step. Pt has difficulty due to foot drop bilaterally.    Exercises     Balance     End of Session OT - End of Session Activity Tolerance: Patient limited by fatigue Patient left: in bed;with call bell/phone within reach  GO     [facility] 03/04/2013, 2:11 PM Marica Otter, OTR/L [phone] 03/04/2013

## 2013-03-04 NOTE — Progress Notes (Signed)
[id number] Davis,RN,BSN,CCM: Actively weaning 02 continues with some delirium, awaiting cir eval.

## 2013-03-04 NOTE — Progress Notes (Signed)
   Subjective: 3 Days Post-Op Procedure(s) (LRB): ARTHROPLASTY BIPOLAR HIP (Right)   Patient resting comfortably in bed this morning. No events throughout the night. Nurse reports that she appears to be improving.    Objective:   VITALS:   Filed Vitals:   03/04/13  BP: 123/62  Pulse: 84  Resp: 20    Neurovascular intact Dorsiflexion/Plantar flexion intact Incision: dressing C/D/I No cellulitis present Compartment soft  LABS  Recent Labs  03/02/13 0403 03/03/13 0530 03/04/13 0445  HGB 8.2* 7.8* 7.6*  HCT 24.1* 22.9* 22.9*  WBC 9.7 10.9* 13.6*  PLT 263 259 259     Recent Labs  03/02/13 2230 03/03/13 0530 03/04/13 0445  NA 136 136 135  K 4.3 3.8 3.2*  BUN 27* 25* 22  CREATININE 0.69 0.58 0.56  GLUCOSE 129* 104* 92     Assessment/Plan: 3 Days Post-Op Procedure(s) (LRB): ARTHROPLASTY BIPOLAR HIP (Right)  Up with therapy Plan for discharge pending further medical stabilization and assessments from PT - most likely SNF  Ortho suggestion would be for:       ASA 325 mg bid for 4 weeks, change if alternate is medically neccessary.       Norco , Rx written, again change if needed Follow up in 2 weeks at [facility]. Follow up with OLIN,Kelon Easom D in 2 weeks.  Contact information:  [facility] [address], [address] [place] [address] [phone]          Anastasio Auerbach. Drakkar Medeiros   PAC  03/04/2013, 8:07 AM

## 2013-03-04 NOTE — Progress Notes (Signed)
[facility] ADULT ICU REPLACEMENT PROTOCOL FOR AM LAB REPLACEMENT ONLY  The patient does not apply for the [facility] Adult ICU Electrolyte Replacment Protocol based on the criteria listed below:   1. Is GFR >/= 40 ml/min? yes  Patient's GFR today is  2. Is urine output >/= 0.5 ml/kg/hr for the last 6 hours? no Patient's UOP is 0.28 ml/kg/hr 3. Is BUN < 60 mg/dL? yes  Patient's BUN today is  4. Abnormal electrolyte(s):K 3.2 5. Ordered repletion with: NA 6. If a panic level lab has been reported, has the CCM MD in charge been notified? yes.   Physician:  Dr Albertina Parr, Jacklin Zwick A 03/04/2013 5:39 AM

## 2013-03-04 NOTE — Progress Notes (Signed)
eLink Physician-Brief Progress Note Patient Name: Kayla [name] DOB: Jan 07, 1951 MRN: [id number]  Date of Service  03/04/2013   HPI/Events of Note     eICU Interventions  Hypokalemia, repleted    Intervention Category Minor Interventions: Electrolytes abnormality - evaluation and management  MCQUAID, DOUGLAS 03/04/2013, 6:29 AM

## 2013-03-04 NOTE — Progress Notes (Signed)
PULMONARY  / CRITICAL CARE MEDICINE  Name: Kayla [name] MRN: [id number] DOB: 11-18-1950    ADMISSION DATE:  02/23/2013 CONSULTATION DATE:  02/24/2013  REFERRING MD :  [facility] PRIMARY SERVICE: PCCM  REASON FOR CONSULTATION:  Hypoxemia  BRIEF PATIENT DESCRIPTION:  62 yo with history of vocal cord paresis (Dr. Sherene Sires) admitted 12/03 with R hip fracture. Developed hypoxia and stridor 12/04 and transfer to ICU.  SIGNIFICANT EVENTS / STUDIES:  12/03  Admitted with R hip fx 12/04  Transferred to ICU with stridor / hypoxemia, ENT consulted 12/04  Laryngoscopy >>>  Bilateral vocal fold paresis bilaterally 12/06  TTE >>> EF 60, diastolic dysfunction, PAP 67 torr 12/07  Neurology consulted, steroids started 12/08  OR >>> canceled due to increased oxygen requirements 12/08  SLP >>> barium swallow study recommended 12/09  OR >>> R hip hemiarthroplasty 12/10  Modified barium swallow >>> Dysphagia 2 diet, thin liquid  LINES / TUBES: R PICC  12/07 >>>  CULTURES: 12/04  MRSA PCR >>> neg 12/03  Urine >>> Pseudomonas (pansensetive) 12/05  Urine >>> Pseudomonas (pansensetive) 12/05  Blood >>> neg  ANTIBIOTICS: Rocephin 12/05 x 1 Zithromax 12/05 >>>12/07 Vancomycin 12/05 >>> 12/09 Zosyn 12/05 >>> 12/07 Cefepime 12/07 >>> 12/07 Levaquin 12/07 >>>  INTERVAL HISTORY:  Decreasing oxygen requirements.  Still delirious at times per family.  VITAL SIGNS: Temp:  [98 F (36.7 C)-99 F (37.2 C)] 98.6 F (37 C) (12/11 2351) Resp:  [15-30] 20 (12/12 1000) BP: (117-155)/(58-82) 123/62 mmHg (12/12 1000) SpO2:  [92 %-100 %] 98 % (12/12 1000) 40% FiO2  INTAKE / OUTPUT: Intake/Output     12/11 0701 - 12/12 0700 12/12 0701 - 12/13 0700   P.O.  120   I.V. (mL/kg) 940 (14.9) 120 (1.9)   IV Piggyback 150 50   Total Intake(mL/kg) 1090 (17.2) 290 (4.6)   Urine (mL/kg/hr) 2035 (1.3) 200 (0.9)   Total Output 2035 200   Net -945 +90         PHYSICAL EXAMINATION: General: sitting in the chair, no  distress, comfortable Neuro: Awake, alert, cooperative  HEENT: No stridor Cardiovascular: RRR, no murmurs Lungs: Bilateral air entry, no added sounds Abdomen: Soft, non tender, bowel sounds present Ext: No edema  LABS: CBC  Recent Labs Lab 03/02/13 0403 03/03/13 0530 03/04/13 0445  WBC 9.7 10.9* 13.6*  HGB 8.2* 7.8* 7.6*  HCT 24.1* 22.9* 22.9*  PLT 263 259 259   Coag's No results found for this basename: APTT, INR,  in the last 168 hours BMET  Recent Labs Lab 03/02/13 2230 03/03/13 0530 03/04/13 0445  NA 136 136 135  K 4.3 3.8 3.2*  CL 99 100 97  CO2 29 30 28   BUN 27* 25* 22  CREATININE 0.69 0.58 0.56  GLUCOSE 129* 104* 92   Electrolytes  Recent Labs Lab 03/02/13 2230 03/03/13 0530 03/04/13 0445  CALCIUM 8.6 8.6 8.5   Sepsis Markers  Recent Labs Lab 02/25/13 1717 02/27/13 0350 03/01/13 0545  LATICACIDVEN 0.7  --   --   PROCALCITON <0.10 0.14 <0.10   ABG  Recent Labs Lab 02/26/13 1953  PHART 7.437  PCO2ART 35.9  PO2ART 41.9*   Liver Enzymes  Recent Labs Lab 02/25/13 1717 02/28/13 0630  AST 42* 23  ALT 18 13  ALKPHOS 62 49  BILITOT 0.3 0.3  ALBUMIN 2.9* 2.3*   Cardiac Enzymes No results found for this basename: TROPONINI, PROBNP,  in the last 168 hours Glucose No results found for this  basename: GLUCAP,  in the last 168 hours  PCX:  None today  ASSESSMENT / PLAN:  PULMONARY A:  Acute hypoxemic respiratory failure. Suspected aspiration pneumonia, resolving. Vocal cord paralysis, at baseline.  History of reactive airway disease, but no acute bronchospasm.  OSA.  Pulmonary hypertension (PAP 67 torr). P:   Goal SpO2>92 Supplemental oxygen, wean as tolerated BIPAP PRN Hold dulera in setting of possible pneumonia Albuterol / Atrovent  CARDIOVASCULAR A:  Acute on chronic diastolic dysfunction. P:  Goal MAP> 60  RENAL A:  Mild hypokalemia. P:   Goal neutral fluid balance Trend BMP Lasix to 20 daily D/c  Foley  GASTROINTESTINAL A:  GERD. Constipation. Dysphagia. Protein calorie malnutrition. P:   Protonix ( preadmission PPI) Dysphagia 2, thin liquid diet Nutritionist to evaluate for supplements, still awaiting for recommendations  HEMATOLOGIC A:  Anemia of chronic disease / acute blood loss. P:  Trend CBC Lovenox for DVT prevention Fe  INFECTIOUS A: Pseudomonas UTI. Possible HCAP. PCT (0.14) reassuring. P:   Abx / cultures as above Would d/c abx after total of 10 days  ENDOCRINE A:  Hypothyroidism. P:   Synthroid   NEUROLOGIC A:  Hx of multiple sclerosis, likely flare up in setting of acute illness.  Pain.  ICU delirium. P:   Morphine PRN Prozac, Restoril, Baclofen, Neurontin Solu-Medrol completed Start Risperdal 0.25 qHS  ORTHOPEDIC A:  R hip fracture, s/p hemiarthroplasty. P: Per Ortho PT/OT Rehab eval  I have personally obtained history, examined patient, evaluated and interpreted laboratory and imaging results, reviewed medical records, formulated assessment / plan and placed orders.  Lonia Farber, MD Pulmonary and Critical Care Medicine [facility] Pager: [phone]  03/04/2013, 10:28 AM

## 2013-03-04 NOTE — Progress Notes (Signed)
SLP Cancellation Note  Patient Details Name: Kayla [name] MRN: [id number] DOB: [birth year]   Cancelled treatment:        Pt currently somnolent, insufficiently arousable for po trials.  Will continue efforts. RN aware. Celia B. [place], [facility], CCC-SLP [phone]   Leigh Aurora 03/04/2013, 2:52 PM

## 2013-03-04 NOTE — Consult Note (Signed)
Physical Medicine and Rehabilitation Consult  Reason for Consult:  Left femur fracture Referring Physician:  Dr. Marin Shutter   HPI: Kayla [name] is a 62 y.o. female with a past medical history significant for MS diagnosed 18 years, JC virus positive currently treated with monthly tysabri infusions, hypothyroidism, cervical cancer, vocal cord paresis, admitted to [facility] 02/23/13 with right displaced femoral neck fracture after a fall at home. Surgical intervention recommended by Dr. Charlann Boxer but on 02/24/13 she developed dyspnea and hypoxia with intermittent stridor and was transferred to ICU. Dr. Emeline Darling consulted for input and laryngoscopy with stable bilateral true vocal fold paresis, narrow~16mm glottic airway anteriorly----ok for intubation. She developed fever on 12/05 due to pseudomonas UTI as well as suspicion of aspiration PNA and was treated broad antibiotic coverage.  Neurology consulted for input on worsening of weakness as family reported increased fatigue and weakness lower and upper extremities prior to admission with recommendation to start steroid dose pack. She was treated with IV solumedrol X three days per Dr. Delight Ovens input.  Once stabilized, she underwent right hip hemi by Dr. Charlann Boxer on 03/01/13. She was extubated without difficulty and MBS done post op to evaluate swallow function. MBS done revealed moderate pharyngeal and cervical esophageal dysphagia and patient placed on D2, thin liquids. She has had issues with pain and lethargy but is showing some improvement in participation with therapy. MD, PT and family requesting CIR.      ROS Past Medical History  Diagnosis Date  . Multiple sclerosis   . Unspecified hypothyroidism   . Other voice and resonance disorders   . Other diseases of vocal cords   . Cervical cancer 2005   Past Surgical History  Procedure Laterality Date  . Vesicovaginal fistula closure w/ tah    . Hip arthroplasty Right 03/01/2013    Procedure: ARTHROPLASTY  BIPOLAR HIP;  Surgeon: Shelda Pal, MD;  Location: [facility]S;  Service: Orthopedics;  Laterality: Right;   Family History  Problem Relation Age of Onset  . Colon cancer Father     Social History:  Married. Independent PTA.  reports that she has never smoked. She has never used smokeless tobacco. She reports that she drinks alcohol. She reports that she does not use illicit drugs.   Allergies  Allergen Reactions  . Methylprednisolone Other (See Comments)    Pt states cannot take any steroids b/c they cause a decrease in her heart rate.  . Oxycodone-Acetaminophen     REACTION: unspecified  . Oxycodone-Acetaminophen     Rash head to toe  . Percocet [Oxycodone-Acetaminophen]     hallucinations  . Phenytoin     REACTION: rash  . Zanaflex [Tizanidine Hydrochloride]   . Phenytoin Sodium Extended Rash   Medications Prior to Admission  Medication Sig Dispense Refill  . AMPYRA 10 MG TB12 Take 1 tablet by mouth 2 (two) times daily.      . baclofen (LIORESAL) 20 MG tablet Take 20 mg by mouth. 1 1/2 tabs four times a day       . dexlansoprazole (DEXILANT) 60 MG capsule Take 60 mg by mouth daily before breakfast.      . fesoterodine (TOVIAZ) 4 MG TB24 tablet Take 4 mg by mouth daily.      Marland Kitchen FLUoxetine (PROZAC) 40 MG capsule Take 40 mg by mouth daily.        Marland Kitchen gabapentin (NEURONTIN) 600 MG tablet Take 600 mg by mouth 2 (two) times daily.      Marland Kitchen levothyroxine (  SYNTHROID) 75 MCG tablet Take 1 tablet (75 mcg total) by mouth daily.  30 tablet  11  . naproxen (NAPROSYN) 500 MG tablet Take 500 mg by mouth 2 (two) times daily with a meal.        . PRESCRIPTION MEDICATION 1 each every 30 (thirty) days. tysabri infusion taken at [facility] on 02/16/2013      . temazepam (RESTORIL) 15 MG capsule Take 15 mg by mouth daily as needed.          Home: Home Living Family/patient expects to be discharged to:: Unsure Living Arrangements: Spouse/significant other Additional Comments: pt  has 4 ste, 1 level home, tub which she is in the process of converting to shower, and standard commode  Functional History:   Functional Status:  Mobility: Bed Mobility Bed Mobility: Supine to Sit Supine to Sit: 1: +2 Total assist Supine to Sit: Patient Percentage: 30% Sit to Supine: 1: +2 Total assist Sit to Supine: Patient Percentage: 0% Transfers Transfers: Sit to Stand;Stand to Sit Sit to Stand: 1: +2 Total assist;From bed;With upper extremity assist Sit to Stand: Patient Percentage: 40% Stand to Sit: 1: +2 Total assist;To chair/3-in-1;With upper extremity assist Stand to Sit: Patient Percentage: 20% Ambulation/Gait Ambulation/Gait Assistance: 1: +2 Total assist Ambulation Distance (Feet): 5 Feet Assistive device: Rolling walker Ambulation/Gait Assistance Details: cues to keep eyes open. Pt with noted bil. foot drop,  Gait Pattern: Step-to pattern;Decreased stance time - right;Right steppage;Left steppage Gait velocity: decreased    ADL: ADL Eating/Feeding: +1 Total assistance (beverage; daughter assisted) Where Assessed - Eating/Feeding: Edge of bed Transfers/Ambulation Related to ADLs: pt sat unsupported eob for 10 minutes.  Had swallow study early and was fatiqued but motivated and ready to get started.   ADL Comments: Educated on THPs and adl modifications.  Introduced Fish farm manager of AE but did not demonstrate on this visit.  Pt had bil tremor (slight present):  she states she can normally do what she wants to do.  At time of eval, total A for adls due to fatique.  Initially pt was unable to lift arms on her own but with a few repetitions of AAROM, she was able to lift R to 90 and L to 70.  Cognition: Cognition Overall Cognitive Status: Impaired/Different from baseline Orientation Level: Oriented X4 (Intermittent confusion ) Cognition Arousal/Alertness: Lethargic Behavior During Therapy: WFL for tasks assessed/performed Overall Cognitive Status: Impaired/Different from  baseline  Blood pressure 123/62, pulse 97, temperature 98.6 F (37 C), temperature source Axillary, resp. rate 20, height 5\' 3"  (1.6 m), weight 139 lb 8.8 oz (63.3 kg), SpO2 98.00%. Physical Exam  Nursing note and vitals reviewed. Constitutional: She is oriented to person, place, and time. She appears well-developed and well-nourished.  HENT:  Head: Normocephalic and atraumatic.  Right Ear: External ear normal.  Left Ear: External ear normal.  Dry oropharynx  Eyes:  Opens eys only briefly  Neck: Normal range of motion.  Cardiovascular: Normal rate, regular rhythm and normal heart sounds.   Respiratory: Effort normal and breath sounds normal.  GI: Soft. Bowel sounds are normal. She exhibits distension. There is tenderness. There is no rebound and no guarding.  Musculoskeletal: She exhibits edema.  Neurological: She is alert and oriented to person, place, and time. No sensory deficit.  Motor strength is 4/5 bilateral deltoid, bicep, tricep, grip 2 minus/5 bilateral knee extensors 1/5 in bilateral flexors/ 2/5 bilateral ankle dorsiflexor plantar flexor  Skin: Skin is warm and dry.    Results  for orders placed during the hospital encounter of 02/23/13 (from the past 24 hour(s))  BASIC METABOLIC PANEL     Status: Abnormal   Collection Time    03/04/13  4:45 AM      Result Value Range   Sodium 135  135 - 145 mEq/L   Potassium 3.2 (*) 3.5 - 5.1 mEq/L   Chloride 97  96 - 112 mEq/L   CO2 28  19 - 32 mEq/L   Glucose, Bld 92  70 - 99 mg/dL   BUN 22  6 - 23 mg/dL   Creatinine, Ser 2.95  0.50 - 1.10 mg/dL   Calcium 8.5  8.4 - 28.4 mg/dL   GFR calc non Af Amer >90  >90 mL/min   GFR calc Af Amer >90  >90 mL/min  CBC     Status: Abnormal   Collection Time    03/04/13  4:45 AM      Result Value Range   WBC 13.6 (*) 4.0 - 10.5 K/uL   RBC 2.66 (*) 3.87 - 5.11 MIL/uL   Hemoglobin 7.6 (*) 12.0 - 15.0 g/dL   HCT 13.2 (*) 44.0 - 10.2 %   MCV 86.1  78.0 - 100.0 fL   MCH 28.6  26.0 - 34.0 pg    MCHC 33.2  30.0 - 36.0 g/dL   RDW 72.5  36.6 - 44.0 %   Platelets 259  150 - 400 K/uL    Assessment/Plan: Diagnosis: Right femoral neck fracture status post hemiarthroplasty on 03/01/2013 1. Does the need for close, 24 hr/day medical supervision in concert with the patient's rehab needs make it unreasonable for this patient to be served in a less intensive setting? Potentially 2. Co-Morbidities requiring supervision/potential complications: Pneumonia with respiratory failure, UTI, multiple sclerosis 3. Due to bladder management, bowel management, safety, skin/wound care, disease management, medication administration, pain management and patient education, does the patient require 24 hr/day rehab nursing? Potentially 4. Does the patient require coordinated care of a physician, rehab nurse, PT (1-2 hrs/day, 5 days/week) and OT (1-2 hrs/day, 5 days/week) to address physical and functional deficits in the context of the above medical diagnosis(es)? Potentially Addressing deficits in the following areas: balance, endurance, locomotion, strength, transferring, bowel/bladder control, bathing, dressing, feeding, grooming and toileting 5. Can the patient actively participate in an intensive therapy program of at least 3 hrs of therapy per day at least 5 days per week? Potentially 6. The potential for patient to make measurable gains while on inpatient rehab is good 7. Anticipated functional outcomes upon discharge from inpatient rehab are supervision to min assist with PT, supervision to min assist with OT, not applicable with SLP. 8. Estimated rehab length of stay to reach the above functional goals is: 10-14 days 9. Does the patient have adequate social supports to accommodate these discharge functional goals? Potentially 10. Anticipated D/C setting: Home 11. Anticipated post D/C treatments: HH therapy 12. Overall Rehab/Functional Prognosis: good  RECOMMENDATIONS: This patient's condition is  appropriate for continued rehabilitative care in the following setting: CIR Patient has agreed to participate in recommended program. Yes Note that insurance prior authorization may be required for reimbursement for recommended care.  Comment: Should be ready in 3-4 days    03/04/2013

## 2013-03-04 NOTE — Progress Notes (Signed)
PHYSICAL THERAPY IS RECOMMENDING CIR CONSULT. SPOKE WITH SPOUSE WHO IS AGREEABLE AND REPORTS THAT PT HAS BEEN BEFORE.Blanchard Kelch PT [phone]

## 2013-03-04 NOTE — Progress Notes (Signed)
NUTRITION FOLLOW UP  Intervention:   Provide Resource Breeze po TID with meals, each supplement provides 250 kcal and 9 grams of protein. Provide Mighty Shakes BID in between meals, each supplement provides 200 kcal and 6 grams of protein.  Provide Magic Cup once daily, provides 290 kcal and 9 grams protein.  Encourage PO intake RD to continue to monitor  Nutrition Dx:   Inadequate oral intake related to inability to eat as evidenced by NPO status since 12/6; ongoing, diet advanced but oral intake remains inadequate  Goal:   Pt to meet >/= 90% of their estimated nutrition needs; not met  Monitor:   Respiratory status; extubated post surgery, some stridor per MD Diet advancement; advanced to Dysphagia 2 diet on 12/10 PO intake; poor, few bites at meals Weight; 5 lbs above admission wt Labs; low potassium, low hemoglobin  Assessment:   Diet advanced to Dysphagia 2 diet on 12/10. Discussed pt in multidisciplinary rounds; pt is only taking a couple bites of food at each meal. She has occasional stridor. Per RN pt does not like Ensure pudding. Per MD, may need to consider appetite stimulant.  Pt lethargic at time of visit and kept her eyes closed but , answered questions and willing to try nutritional supplements. Pt reports eating well PTA; pt states she usually eats 3 small meals daily and usually maintains her weight around 130 lbs. Pt did not like Ensure Complete but, states she usually likes chocolate milk; will try Mighty Shakes. Pt likes Resource Breeze supplement. Encouraged PO intake and use of supplements until her appetite improves.   Height: Ht Readings from Last 1 Encounters:  02/23/13 5\' 3"  (1.6 m)    Weight Status:   Wt Readings from Last 1 Encounters:  03/01/13 139 lb 8.8 oz (63.3 kg)  Admission weight 134 lbs on 02/26/13  Re-estimated needs:  Kcal: 1500-1700 Protein: 75-85 grams Fluid: >/= 1/7 L/day  Skin: +1 RLE and LLE edema; right hip incision  Diet Order:  Dysphagia   Intake/Output Summary (Last 24 hours) at 03/04/13 1153 Last data filed at 03/04/13 1100  Gross per 24 hour  Intake   1240 ml  Output   2135 ml  Net   -895 ml    Last BM: 12/2 per nursing notes   Labs:   Recent Labs Lab 03/02/13 2230 03/03/13 0530 03/04/13 0445  NA 136 136 135  K 4.3 3.8 3.2*  CL 99 100 97  CO2 29 30 28   BUN 27* 25* 22  CREATININE 0.69 0.58 0.56  CALCIUM 8.6 8.6 8.5  GLUCOSE 129* 104* 92    CBG (last 3)  No results found for this basename: GLUCAP,  in the last 72 hours  Scheduled Meds: . ipratropium  0.5 mg Nebulization Q6H WA   And  . albuterol  2.5 mg Nebulization Q6H WA  . dalfampridine  10 mg Oral BID  . docusate sodium  100 mg Oral BID  . enoxaparin (LOVENOX) injection  40 mg Subcutaneous Q24H  . ferrous sulfate  325 mg Oral TID PC  . fesoterodine  4 mg Oral Daily  . FLUoxetine  40 mg Oral Daily  . furosemide  20 mg Intravenous Daily  . gabapentin  600 mg Oral BID  . levofloxacin (LEVAQUIN) IV  750 mg Intravenous Q24H  . levothyroxine  75 mcg Oral Daily  . pantoprazole (PROTONIX) IV  40 mg Intravenous Q12H  . polyethylene glycol  17 g Oral Daily  . potassium chloride  10 mEq Intravenous Q1 Hr x 6  . risperiDONE  0.25 mg Oral QHS  . senna  2 tablet Oral QHS  . sodium chloride  10-40 mL Intracatheter Q12H  . sodium phosphate  1 enema Rectal Once  . vitamin B-12  1,000 mcg Oral Daily    Continuous Infusions:   Ian Malkin RD, LDN Inpatient Clinical Dietitian Pager: [phone] After Hours Pager: [phone]

## 2013-03-05 DIAGNOSIS — IMO0002 Reserved for concepts with insufficient information to code with codable children: Secondary | ICD-10-CM

## 2013-03-05 LAB — CBC
HCT: 22.8 % — ABNORMAL LOW (ref 36.0–46.0)
Hemoglobin: 7.7 g/dL — ABNORMAL LOW (ref 12.0–15.0)
MCH: 28.9 pg (ref 26.0–34.0)
MCHC: 33.8 g/dL (ref 30.0–36.0)
MCV: 85.7 fL (ref 78.0–100.0)
Platelets: 245 10*3/uL (ref 150–400)
RBC: 2.66 MIL/uL — ABNORMAL LOW (ref 3.87–5.11)
RDW: 14.3 % (ref 11.5–15.5)
WBC: 16.6 10*3/uL — ABNORMAL HIGH (ref 4.0–10.5)

## 2013-03-05 LAB — BASIC METABOLIC PANEL
BUN: 19 mg/dL (ref 6–23)
CO2: 26 mEq/L (ref 19–32)
Calcium: 8.3 mg/dL — ABNORMAL LOW (ref 8.4–10.5)
Chloride: 98 mEq/L (ref 96–112)
Creatinine, Ser: 0.57 mg/dL (ref 0.50–1.10)
GFR calc Af Amer: >90 mL/min (ref >90)
GFR calc non Af Amer: >90 mL/min (ref >90)
Glucose, Bld: 97 mg/dL (ref 70–99)
Potassium: 3.8 mEq/L (ref 3.5–5.1)
Sodium: 134 mEq/L — ABNORMAL LOW (ref 135–145)

## 2013-03-05 MED ORDER — POTASSIUM CHLORIDE CRYS ER 20 MEQ PO TBCR
20.0000 meq | EXTENDED_RELEASE_TABLET | Freq: Every day | ORAL | Status: DC
  Administered 2013-03-05 – 2013-03-08 (×4): 20 meq via ORAL
  Filled 2013-03-05 (×4): qty 1

## 2013-03-05 MED ORDER — VITAMINS A & D EX OINT
TOPICAL_OINTMENT | CUTANEOUS | Status: AC
  Filled 2013-03-05: qty 5

## 2013-03-05 MED ORDER — MOMETASONE FURO-FORMOTEROL FUM 100-5 MCG/ACT IN AERO
2.0000 | INHALATION_SPRAY | Freq: Two times a day (BID) | RESPIRATORY_TRACT | Status: DC
  Administered 2013-03-05 – 2013-03-08 (×7): 2 via RESPIRATORY_TRACT
  Filled 2013-03-05: qty 8.8

## 2013-03-05 NOTE — Progress Notes (Signed)
PULMONARY  / CRITICAL CARE MEDICINE  Name: Kayla [name] MRN: [id number] DOB: 05/16/1950    ADMISSION DATE:  02/23/2013 CONSULTATION DATE:  02/24/2013  REFERRING MD :  [facility] PRIMARY SERVICE: PCCM  REASON FOR CONSULTATION:  Hypoxemia  BRIEF PATIENT DESCRIPTION:  62 yo with history of vocal cord paresis (Dr. Sherene Sires) admitted 12/03 with R hip fracture. Developed hypoxia and stridor 12/04 and transfer to ICU.  SIGNIFICANT EVENTS / STUDIES:  12/03  Admitted with R hip fx 12/04  Transferred to ICU with stridor / hypoxemia, ENT consulted 12/04  Laryngoscopy >>>  Bilateral vocal fold paresis bilaterally 12/06  TTE >>> EF 60, diastolic dysfunction, PAP 67 torr 12/07  Neurology consulted, steroids started 12/08  OR >>> canceled due to increased oxygen requirements 12/08  SLP >>> barium swallow study recommended 12/09  OR >>> R hip hemiarthroplasty 12/10  Modified barium swallow >>> Dysphagia 2 diet, thin liquid  LINES / TUBES: R PICC  12/07 >>>  CULTURES: 12/04  MRSA PCR >>> neg 12/03  Urine >>> Pseudomonas (pansensetive) 12/05  Urine >>> Pseudomonas (pansensetive) 12/05  Blood >>> neg  ANTIBIOTICS: Rocephin 12/05 x 1 Zithromax 12/05 >>>12/07 Vancomycin 12/05 >>> 12/09 Zosyn 12/05 >>> 12/07 Cefepime 12/07 >>> 12/07 Levaquin 12/07 >>>  INTERVAL HISTORY:  Walked this morning; needed in/out cath this morning but has voided since; breathing OK  VITAL SIGNS: Temp:  [97.4 F (36.3 C)-98.6 F (37 C)] 98.1 F (36.7 C) (12/13 0800) Resp:  [15-23] 21 (12/13 0900) BP: (105-145)/(55-77) 131/60 mmHg (12/13 0900) SpO2:  [92 %-100 %] 99 % (12/13 0903) 2L Brier  INTAKE / OUTPUT: Intake/Output     12/12 0701 - 12/13 0700 12/13 0701 - 12/14 0700   P.O. 600 120   I.V. (mL/kg) 800 (12.6) 80 (1.3)   IV Piggyback 450    Total Intake(mL/kg) 1850 (29.2) 200 (3.2)   Urine (mL/kg/hr) 1525 (1)    Total Output 1525     Net +325 +200        Urine Occurrence     Stool Occurrence 2 x     PHYSICAL  EXAMINATION:  Gen: sitting up in chair, comfortable HEENT: NCAT, EOMi, OP clear PULM: CTA B CV: RRR, no mgr, no JVD AB: BS+, soft, nontender, no hsm Ext: warm, no edema, no clubbing, no cyanosis Derm: R hip wound site dressing c/d/i, no erythema, tenderness Neuro: A&Ox4, CN II-XII intact, MAEW   LABS: CBC  Recent Labs Lab 03/03/13 0530 03/04/13 0445 03/05/13 0351  WBC 10.9* 13.6* 16.6*  HGB 7.8* 7.6* 7.7*  HCT 22.9* 22.9* 22.8*  PLT 259 259 245   Coag's No results found for this basename: APTT, INR,  in the last 168 hours BMET  Recent Labs Lab 03/03/13 0530 03/04/13 0445 03/05/13 0351  NA 136 135 134*  K 3.8 3.2* 3.8  CL 100 97 98  CO2 30 28 26   BUN 25* 22 19  CREATININE 0.58 0.56 0.57  GLUCOSE 104* 92 97   Electrolytes  Recent Labs Lab 03/03/13 0530 03/04/13 0445 03/05/13 0351  CALCIUM 8.6 8.5 8.3*   Sepsis Markers  Recent Labs Lab 02/27/13 0350 03/01/13 0545  PROCALCITON 0.14 <0.10   ABG  Recent Labs Lab 02/26/13 1953  PHART 7.437  PCO2ART 35.9  PO2ART 41.9*   Liver Enzymes  Recent Labs Lab 02/28/13 0630  AST 23  ALT 13  ALKPHOS 49  BILITOT 0.3  ALBUMIN 2.3*   Cardiac Enzymes No results found for this basename: TROPONINI,  PROBNP,  in the last 168 hours Glucose No results found for this basename: GLUCAP,  in the last 168 hours  PCX:  None today  ASSESSMENT / PLAN:  PULMONARY A:  Acute hypoxemic respiratory failure> resolved Suspected aspiration pneumonia, resolving.  Vocal cord paralysis, at baseline.   History of reactive airway disease, but no acute bronchospasm.   OSA.   Pulmonary hypertension (PAP 67 torr on echo, no RHC data) P:   Goal SpO2>92 Supplemental oxygen, wean as tolerated Restart Dulera Albuterol  prn  CARDIOVASCULAR A:  Acute on chronic diastolic dysfunction resolved P:  Goal MAP> 60  RENAL A:  Mild hypokalemia. P:   Goal even fluid balance KCL daily Trend BMP Lasix 20  daily  GASTROINTESTINAL A:   GERD.  Constipation.  Dysphagia.  Protein calorie malnutrition. P:   Protonix ( preadmission PPI) Dysphagia 2, thin liquid diet Dietary supplements/shakes per dietary  HEMATOLOGIC A:  Anemia of chronic disease / acute blood loss. P:  Trend CBC Lovenox for DVT prevention Fe  INFECTIOUS A:  Pseudomonas UTI.   P:   Abx / cultures as above Levaquin through 12/15  ENDOCRINE A:  Hypothyroidism. P:   Synthroid   NEUROLOGIC A:  Hx of multiple sclerosis, stable post steroid given empirically for flare.   Pain.   ICU delirium resolved P:   Morphine PRN Prozac, Restoril, Baclofen, Neurontin Solu-Medrol completed isperdal 0.25 qHS  ORTHOPEDIC A:  R hip fracture, s/p hemiarthroplasty. P: Per Ortho PT/OT Inpatient rehab week of 12/15  Yolonda Kida PCCM Pager: [phone] Cell: [phone] If no response, call [phone]  03/05/2013, 10:38 AM

## 2013-03-05 NOTE — Progress Notes (Addendum)
Physical Therapy Treatment Patient Details Name: Kayla [name] MRN: [id number] DOB: 20-Mar-1951 Today's Date: 03/05/2013 Time: 0922-1009 PT Time Calculation (min): 47 min  PT Assessment / Plan / Recommendation  History of Present Illness Pt. admitted 12/4 after trip and fall, R subcapital hip fx. Began respiratory distress, surgery for hemiarthroplasty performed 03/01/13. Pt has h/o MS.   PT Comments   Pt is much more alert, interacting with daughter. Dosiflexion support provided with ace wraps which facilitated ambulation. Recommend CIR.   Follow Up Recommendations  CIR     Does the patient have the potential to tolerate intense rehabilitation     Barriers to Discharge        Equipment Recommendations  Rolling walker with 5" wheels (bil, AFO's may be indicated.)    Recommendations for Other Services    Frequency Min 5X/week   Progress towards PT Goals Progress towards PT goals: Progressing toward goals  Plan Frequency needs to be updated    Precautions / Restrictions Precautions Precautions: Fall;Posterior Hip Precaution Comments: THP sign hung in room Restrictions Weight Bearing Restrictions: No RLE Weight Bearing: Weight bearing as tolerated   Pertinent Vitals/Pain No c/o pain. sats 98% 2 l- place on 3 and 4 l for mobility as pt tends to breathe through mouth.sat drop into hi 80's at times. encouraged pursed lip breathing. HR up to 115 with activity.   Mobility  Bed Mobility Supine to Sit: 1: +2 Total assist Supine to Sit: Patient Percentage: 40% Sitting - Scoot to Edge of Bed: 3: Mod assist Details for Bed Mobility Assistance: extra time and cues for moving legs to edge, using UE's to push self to sitting and scooting to the edge of the bed. Pt improved in  assisting self. Transfers Transfers: Stand Pivot Transfers Sit to Stand: 1: +2 Total assist;With upper extremity assist;From chair/3-in-1;From bed Sit to Stand: Patient Percentage: 50% Stand to Sit: 1: +2 Total  assist;To chair/3-in-1;With upper extremity assist Stand to Sit: Patient Percentage: 60% Stand Pivot Transfers: 1: +2 Total assist Stand Pivot Transfers: Patient Percentage: 60% Details for Transfer Assistance:   VC's on proper technique and to adhere to THP.  Increased time and assist for controlled decent to recliner.  cues for posture to facilitate taking a step. Pt able to move each leg to take steps to pivot from Recliner to [facility] and back to recliner. Ambulation/Gait Ambulation/Gait Assistance: 1: +2 Total assist Ambulation/Gait: Patient Percentage: 60% Ambulation Distance (Feet): 10 Feet Assistive device: Rolling walker Ambulation/Gait Assistance Details: Each foot wrapped with ace to support in dorsiflexion which facilitated pt taking steps with decr. footdrop.  Gait Pattern: Step-to pattern;Right steppage;Left steppage;Trunk flexed Gait velocity: decreased General Gait Details: cues for pursed lip breathing, erect posture.    Exercises     PT Diagnosis:    PT Problem List:   PT Treatment Interventions:     PT Goals (current goals can now be found in the care plan section)    Visit Information  Last PT Received On: 03/05/13 Assistance Needed: +2 History of Present Illness: Pt. admitted 12/4 after trip and fall, R subcapital hip fx. Began respiratory distress, surgery for hemiarthroplasty performed 03/01/13. Pt has h/o MS.    Subjective Data      Cognition  Cognition Arousal/Alertness: Awake/alert (much more alert.) Behavior During Therapy: WFL for tasks assessed/performed Overall Cognitive Status: Within Functional Limits for tasks assessed    Balance  Balance Balance Assessed: Yes Static Standing Balance Static Standing - Balance Support: Bilateral upper  extremity supported Static Standing - Level of Assistance: 3: Mod assist Static Standing - Comment/# of Minutes: stood for hygiene after toileting x 2 minutes.  End of Session PT - End of Session Equipment Utilized  During Treatment: Gait belt Activity Tolerance: Patient tolerated treatment well Patient left: with call bell/phone within reach;with family/visitor present;in chair Nurse Communication: Mobility status   GP     Rada Hay 03/05/2013, 11:01 AM Blanchard Kelch PT [phone]

## 2013-03-05 NOTE — Progress Notes (Signed)
Pt unable to void since foley cath was removed at 1630 03/04/13, bladder scan done at 0500 showed >349ml of urine. Pt assisted to the bedside commode but was still unable to void. In and out cath was done and of urine was obtained. Will continue to monitor.

## 2013-03-06 ENCOUNTER — Inpatient Hospital Stay (HOSPITAL_COMMUNITY): Payer: 59

## 2013-03-06 DIAGNOSIS — R05 Cough: Secondary | ICD-10-CM

## 2013-03-06 DIAGNOSIS — R059 Cough, unspecified: Secondary | ICD-10-CM

## 2013-03-06 LAB — CBC
HCT: 21.9 % — ABNORMAL LOW (ref 36.0–46.0)
Hemoglobin: 7.4 g/dL — ABNORMAL LOW (ref 12.0–15.0)
MCH: 28.8 pg (ref 26.0–34.0)
MCHC: 33.8 g/dL (ref 30.0–36.0)
MCV: 85.2 fL (ref 78.0–100.0)
Platelets: 239 10*3/uL (ref 150–400)
RBC: 2.57 MIL/uL — ABNORMAL LOW (ref 3.87–5.11)
RDW: 14.2 % (ref 11.5–15.5)
WBC: 15.6 10*3/uL — ABNORMAL HIGH (ref 4.0–10.5)

## 2013-03-06 LAB — BASIC METABOLIC PANEL
BUN: 16 mg/dL (ref 6–23)
CO2: 29 mEq/L (ref 19–32)
Calcium: 8.3 mg/dL — ABNORMAL LOW (ref 8.4–10.5)
Chloride: 101 mEq/L (ref 96–112)
Creatinine, Ser: 0.52 mg/dL (ref 0.50–1.10)
GFR calc Af Amer: >90 mL/min (ref >90)
GFR calc non Af Amer: >90 mL/min (ref >90)
Glucose, Bld: 108 mg/dL — ABNORMAL HIGH (ref 70–99)
Potassium: 4 mEq/L (ref 3.5–5.1)
Sodium: 135 mEq/L (ref 135–145)

## 2013-03-06 MED ORDER — GUAIFENESIN-CODEINE 100-10 MG/5ML PO SOLN
5.0000 mL | Freq: Two times a day (BID) | ORAL | Status: DC | PRN
  Administered 2013-03-06: 5 mL via ORAL
  Filled 2013-03-06: qty 5

## 2013-03-06 MED ORDER — DEXAMETHASONE SODIUM PHOSPHATE 4 MG/ML IJ SOLN
4.0000 mg | Freq: Two times a day (BID) | INTRAMUSCULAR | Status: AC
  Administered 2013-03-06 – 2013-03-07 (×3): 4 mg via INTRAVENOUS
  Filled 2013-03-06 (×3): qty 1

## 2013-03-06 NOTE — Progress Notes (Signed)
PULMONARY  / CRITICAL CARE MEDICINE  Name: Kayla [name] MRN: [id number] DOB: 1950-06-13    ADMISSION DATE:  02/23/2013 CONSULTATION DATE:  02/24/2013  REFERRING MD :  [facility] PRIMARY SERVICE: PCCM  REASON FOR CONSULTATION:  Hypoxemia  BRIEF PATIENT DESCRIPTION:  62 yo with history of vocal cord paresis (Dr. Sherene Sires) admitted 12/03 with R hip fracture. Developed hypoxia and stridor 12/04 and transfer to ICU.  SIGNIFICANT EVENTS / STUDIES:  12/03  Admitted with R hip fx 12/04  Transferred to ICU with stridor / hypoxemia, ENT consulted 12/04  Laryngoscopy >>>  Bilateral vocal fold paresis bilaterally 12/06  TTE >>> EF 60, diastolic dysfunction, PAP 67 torr 12/07  Neurology consulted, steroids started 12/08  OR >>> canceled due to increased oxygen requirements 12/08  SLP >>> barium swallow study recommended 12/09  OR >>> R hip hemiarthroplasty 12/10  Modified barium swallow >>> Dysphagia 2 diet, thin liquid  LINES / TUBES: R PICC  12/07 >>>  CULTURES: 12/04  MRSA PCR >>> neg 12/03  Urine >>> Pseudomonas (pansensetive) 12/05  Urine >>> Pseudomonas (pansensetive) 12/05  Blood >>> neg  ANTIBIOTICS: Rocephin 12/05 x 1 Zithromax 12/05 >>>12/07 Vancomycin 12/05 >>> 12/09 Zosyn 12/05 >>> 12/07 Cefepime 12/07 >>> 12/07 Levaquin 12/07 >>>  INTERVAL HISTORY:  Cough this morning, otherwise breathing OK  VITAL SIGNS: Temp:  [97.5 F (36.4 C)-99.4 F (37.4 C)] 98.1 F (36.7 C) (12/14 0800) Resp:  [15-28] 19 (12/14 0900) BP: (104-131)/(56-73) 126/63 mmHg (12/14 0900) SpO2:  [94 %-100 %] 97 % (12/14 0906) Weight:  [62.6 kg (138 lb 0.1 oz)] 62.6 kg (138 lb 0.1 oz) (12/14 0400) 2L [place]  INTAKE / OUTPUT: Intake/Output     12/13 0701 - 12/14 0700 12/14 0701 - 12/15 0700   P.O. 120    I.V. (mL/kg) 930 (14.9) 80 (1.3)   IV Piggyback 150    Total Intake(mL/kg) 1200 (19.2) 80 (1.3)   Urine (mL/kg/hr) 1205 (0.8)    Total Output 1205     Net -5 +80        Stool Occurrence 2 x      PHYSICAL EXAMINATION:  Gen: sitting up in bed, coughing HEENT: NCAT, EOMi, OP clear PULM: upper airway stridor with cough, good air movement CV: RRR, no mgr, no JVD AB: BS+, soft, nontender, no hsm Ext: warm, no edema, no clubbing, no cyanosis Derm: R hip wound site dressing c/d/i, no erythema, tenderness Neuro: A&Ox4, CN II-XII intact, MAEW   LABS: CBC  Recent Labs Lab 03/04/13 0445 03/05/13 0351 03/06/13 0521  WBC 13.6* 16.6* 15.6*  HGB 7.6* 7.7* 7.4*  HCT 22.9* 22.8* 21.9*  PLT 259 245 239   Coag's No results found for this basename: APTT, INR,  in the last 168 hours BMET  Recent Labs Lab 03/04/13 0445 03/05/13 0351 03/06/13 0521  NA 135 134* 135  K 3.2* 3.8 4.0  CL 97 98 101  CO2 28 26 29   BUN 22 19 16   CREATININE 0.56 0.57 0.52  GLUCOSE 92 97 108*   Electrolytes  Recent Labs Lab 03/04/13 0445 03/05/13 0351 03/06/13 0521  CALCIUM 8.5 8.3* 8.3*   Sepsis Markers  Recent Labs Lab 03/01/13 0545  PROCALCITON <0.10   ABG No results found for this basename: PHART, PCO2ART, PO2ART,  in the last 168 hours Liver Enzymes  Recent Labs Lab 02/28/13 0630  AST 23  ALT 13  ALKPHOS 49  BILITOT 0.3  ALBUMIN 2.3*   Cardiac Enzymes No results found for  this basename: TROPONINI, PROBNP,  in the last 168 hours Glucose No results found for this basename: GLUCAP,  in the last 168 hours  PCX:  None today  ASSESSMENT / PLAN:  PULMONARY A:   Cough with mild stridor (related to vocal cord paralysis) 12/14, no resp distress Aspiration pneumonia, resolving.  Vocal cord paralysis, at baseline.   History of reactive airway disease, but no acute bronchospasm.   OSA.   Pulmonary hypertension (PAP 67 torr on echo, no RHC data) P:   Decadron x3 doses starting 12/14 Low dose Codeine for cough Albuterol now and prn CXR 12/14 Goal SpO2>92 Supplemental oxygen, wean as tolerated Dulera   CARDIOVASCULAR A:  Acute on chronic diastolic dysfunction  resolved P:  Goal MAP> 60  RENAL A:  Mild hypokalemia. P:   Goal even fluid balance KCL daily Trend BMP Lasix 20 daily  GASTROINTESTINAL A:   GERD.  Constipation.  Dysphagia.  Protein calorie malnutrition. P:   Protonix ( preadmission PPI) Dysphagia 2, thin liquid diet Dietary supplements/shakes per dietary  HEMATOLOGIC A:  Anemia of chronic disease / acute blood loss. P:  Trend CBC Lovenox for DVT prevention Fe  INFECTIOUS A:  Pseudomonas UTI.   P:   Abx / cultures as above Levaquin through 12/15  ENDOCRINE A:  Hypothyroidism. P:   Synthroid   NEUROLOGIC A:  Hx of multiple sclerosis, stable post steroid given empirically for flare.   Pain.   ICU delirium resolved P:   Morphine PRN Prozac, Restoril, Baclofen, Neurontin Solu-Medrol completed Risperdal 0.25 qHS  ORTHOPEDIC A:  R hip fracture, s/p hemiarthroplasty. P: Per Ortho PT/OT Inpatient rehab week of 12/15  Yolonda Kida PCCM Pager: [phone] Cell: [phone] If no response, call [phone]  03/06/2013, 9:19 AM

## 2013-03-07 ENCOUNTER — Inpatient Hospital Stay (HOSPITAL_COMMUNITY): Payer: 59

## 2013-03-07 LAB — BASIC METABOLIC PANEL
BUN: 13 mg/dL (ref 6–23)
CO2: 26 mEq/L (ref 19–32)
Calcium: 8.4 mg/dL (ref 8.4–10.5)
Chloride: 95 mEq/L — ABNORMAL LOW (ref 96–112)
Creatinine, Ser: 0.53 mg/dL (ref 0.50–1.10)
GFR calc Af Amer: >90 mL/min (ref >90)
GFR calc non Af Amer: >90 mL/min (ref >90)
Glucose, Bld: 130 mg/dL — ABNORMAL HIGH (ref 70–99)
Potassium: 4.3 mEq/L (ref 3.5–5.1)
Sodium: 132 mEq/L — ABNORMAL LOW (ref 135–145)

## 2013-03-07 LAB — CBC
HCT: 24 % — ABNORMAL LOW (ref 36.0–46.0)
Hemoglobin: 8.1 g/dL — ABNORMAL LOW (ref 12.0–15.0)
MCH: 28.8 pg (ref 26.0–34.0)
MCHC: 33.8 g/dL (ref 30.0–36.0)
MCV: 85.4 fL (ref 78.0–100.0)
Platelets: 275 10*3/uL (ref 150–400)
RBC: 2.81 MIL/uL — ABNORMAL LOW (ref 3.87–5.11)
RDW: 14.4 % (ref 11.5–15.5)
WBC: 18.2 10*3/uL — ABNORMAL HIGH (ref 4.0–10.5)

## 2013-03-07 LAB — PROCALCITONIN: Procalcitonin: <0.1 ng/mL

## 2013-03-07 MED ORDER — PANTOPRAZOLE SODIUM 40 MG PO TBEC
40.0000 mg | DELAYED_RELEASE_TABLET | Freq: Two times a day (BID) | ORAL | Status: DC
  Administered 2013-03-07 – 2013-03-08 (×3): 40 mg via ORAL
  Filled 2013-03-07 (×4): qty 1

## 2013-03-07 NOTE — Progress Notes (Signed)
Physical Therapy Treatment Patient Details Name: Kayla [name] MRN: [id number] DOB: 05/24/1950 Today's Date: 03/07/2013 Time: 4098-1191 PT Time Calculation (min): 43 min  PT Assessment / Plan / Recommendation  History of Present Illness Pt. admitted 12/4 after trip and fall, R subcapital hip fx. Began respiratory distress, surgery for hemiarthroplasty performed 03/01/13. Pt has h/o MS.   PT Comments   Pt desats with increased activity, encouraged pursed lip breaths.  Increased O2 to 4 l for recovery. Pt is very participatory, no pain, mental status appears WFL.  Follow Up Recommendations  CIR     Does the patient have the potential to tolerate intense rehabilitation     Barriers to Discharge        Equipment Recommendations  Rolling walker with 5" wheels    Recommendations for Other Services Rehab consult  Frequency Min 5X/week   Progress towards PT Goals Progress towards PT goals: Progressing toward goals  Plan Current plan remains appropriate    Precautions / Restrictions Precautions Precautions: Fall;Posterior Hip Precaution Comments: THP sign hung in room Restrictions Weight Bearing Restrictions: Yes RLE Weight Bearing: Weight bearing as tolerated   Pertinent Vitals/Pain 100% 2 l. Hr 85 After amb to BR and back to recliner, dropped to 78% 2 l., HR 128. , O2 incr to 4 l for recovery back to > 90% and HR decr to 90's. RN aware of desatting.    Mobility  Bed Mobility Bed Mobility: Not assessed Transfers Sit to Stand: 1: +2 Total assist;With upper extremity assist;From chair/3-in-1;From bed Sit to Stand: Patient Percentage: 50% Stand to Sit: 1: +2 Total assist;To chair/3-in-1;With upper extremity assist Stand to Sit: Patient Percentage: 60% Details for Transfer Assistance:   VC's on proper technique and to adhere to THP.  Increased time and assist for controlled decent to recliner.  cues for posture to facilitate taking a step. Much more difficulty from toilet withou  riseAND SRMRESTS. Ambulation/Gait Ambulation/Gait Assistance: 1: +2 Total assist Ambulation/Gait: Patient Percentage: 60% Ambulation Distance (Feet): 15 Feet (X2) Ambulation/Gait Assistance Details: ace Wrap for each ankle to increase dorsiflexion. frequent cues to attempt  keeping R leg in more neutral as R leg tends to internall rotate.  Pt ad more difficulty walking back from BR, decreased sats and fatigue. Gait Pattern: Step-to pattern;Antalgic;Trunk flexed;Decreased step length - right;Decreased stance time - right Gait velocity: decreased General Gait Details: cues for pursed lip breathing, erect posture.    Exercises Total Joint Exercises Ankle Circles/Pumps: AAROM;Both;10 reps;Seated Quad Sets: AROM;Both;10 reps;Seated Heel Slides: AAROM;Both;10 reps;Supine Hip ABduction/ADduction: AAROM;Both;10 reps;Supine Long Arc Quad: AROM;Both;10 reps;Seated   PT Diagnosis:    PT Problem List:   PT Treatment Interventions:     PT Goals (current goals can now be found in the care plan section)    Visit Information  Last PT Received On: 03/07/13 Assistance Needed: +2 PT/OT/SLP Co-Evaluation/Treatment: Yes Reason for Co-Treatment: Complexity of the patient's impairments (multi-system involvement);For patient/therapist safety PT goals addressed during session: Mobility/safety with mobility;Proper use of DME;Strengthening/ROM;Other (comment) (hip precautions) History of Present Illness: Pt. admitted 12/4 after trip and fall, R subcapital hip fx. Began respiratory distress, surgery for hemiarthroplasty performed 03/01/13. Pt has h/o MS.    Subjective Data      Cognition  Cognition Arousal/Alertness: Awake/alert Overall Cognitive Status: Within Functional Limits for tasks assessed    Balance     End of Session PT - End of Session Activity Tolerance: Patient limited by fatigue Patient left: in chair;with call bell/phone within reach Nurse  Communication: Mobility status   GP      Rada Hay 03/07/2013, 10:46 AM Blanchard Kelch PT [phone]

## 2013-03-07 NOTE — Progress Notes (Signed)
PULMONARY  / CRITICAL CARE MEDICINE  Name: Kayla [name] MRN: [id number] DOB: 1951/03/23    ADMISSION DATE:  02/23/2013 CONSULTATION DATE:  02/24/2013  REFERRING MD :  [facility] PRIMARY SERVICE: PCCM  REASON FOR CONSULTATION:  Hypoxemia  BRIEF PATIENT DESCRIPTION:  62 yo with history of vocal cord paresis (Dr. Sherene Sires) admitted 12/03 with R hip fracture. Developed hypoxia and stridor 12/04 and transfer to ICU.  SIGNIFICANT EVENTS / STUDIES:  12/03  Admitted with R hip fx 12/04  Transferred to ICU with stridor / hypoxemia, ENT consulted 12/04  Laryngoscopy >>>  Bilateral vocal fold paresis bilaterally 12/06  TTE >>> EF 60, diastolic dysfunction, PAP 67 torr 12/07  Neurology consulted, steroids started 12/08  OR >>> canceled due to increased oxygen requirements 12/08  SLP >>> barium swallow study recommended 12/09  OR >>> R hip hemiarthroplasty 12/10  Modified barium swallow >>> Dysphagia 2 diet, thin liquid  LINES / TUBES: R PICC  12/07 >>>  CULTURES: 12/04  MRSA PCR >>> neg 12/03  Urine >>> Pseudomonas (pansensetive) 12/05  Urine >>> Pseudomonas (pansensetive) 12/05  Blood >>> neg  ANTIBIOTICS: Rocephin 12/05 x 1 Zithromax 12/05 >>>12/07 Vancomycin 12/05 >>> 12/09 Zosyn 12/05 >>> 12/07 Cefepime 12/07 >>> 12/07 Levaquin 12/07 >>>  INTERVAL HISTORY:  Cough this morning, otherwise breathing OK  VITAL SIGNS: Temp:  [97.7 F (36.5 C)-98.6 F (37 C)] 97.9 F (36.6 C) (12/15 0800) Pulse Rate:  [75] 75 (12/15 0729) Resp:  [13-28] 24 (12/15 1200) BP: (82-135)/(47-64) 82/47 mmHg (12/15 1200) SpO2:  [95 %-100 %] 95 % (12/15 1200) 2L [place]  INTAKE / OUTPUT: Intake/Output     12/14 0701 - 12/15 0700 12/15 0701 - 12/16 0700   P.O. 500 240   I.V. (mL/kg) 940 (15) 170 (2.7)   IV Piggyback 150 150   Total Intake(mL/kg) 1590 (25.4) 560 (8.9)   Urine (mL/kg/hr) 1200 (0.8) 950 (2.9)   Total Output 1200 950   Net +390 -390        Stool Occurrence  1 x    PHYSICAL  EXAMINATION:  Gen: sitting up in chair, weak cough HEENT: NCAT, EOMi, OP clear PULM: upper airway stridor decreased with cough, good air movement, but decreased in left CV: RRR, no mgr, no JVD AB: BS+, soft, nontender, no hsm Ext: warm, no edema, no clubbing, no cyanosis Derm: R hip wound site dressing c/d/i, no erythema, tenderness Neuro: A&Ox4, CN II-XII intact, MAEW   LABS: CBC  Recent Labs Lab 03/05/13 0351 03/06/13 0521 03/07/13 0517  WBC 16.6* 15.6* 18.2*  HGB 7.7* 7.4* 8.1*  HCT 22.8* 21.9* 24.0*  PLT 245 239 275   Coag's No results found for this basename: APTT, INR,  in the last 168 hours BMET  Recent Labs Lab 03/05/13 0351 03/06/13 0521 03/07/13 0517  NA 134* 135 132*  K 3.8 4.0 4.3  CL 98 101 95*  CO2 26 29 26   BUN 19 16 13   CREATININE 0.57 0.52 0.53  GLUCOSE 97 108* 130*   Electrolytes  Recent Labs Lab 03/05/13 0351 03/06/13 0521 03/07/13 0517  CALCIUM 8.3* 8.3* 8.4   Sepsis Markers  Recent Labs Lab 03/01/13 0545  PROCALCITON <0.10   ABG No results found for this basename: PHART, PCO2ART, PO2ART,  in the last 168 hours Liver Enzymes No results found for this basename: AST, ALT, ALKPHOS, BILITOT, ALBUMIN,  in the last 168 hours Cardiac Enzymes No results found for this basename: TROPONINI, PROBNP,  in the last 168  hours Glucose No results found for this basename: GLUCAP,  in the last 168 hours  PCX:  None today  ASSESSMENT / PLAN:  PULMONARY A:   Cough with mild stridor (related to vocal cord paralysis) 12/15, no resp distress Aspiration pneumonia, resolving.  Vocal cord paralysis, at baseline.   History of reactive airway disease, but no acute bronchospasm.   OSA.   Pulmonary hypertension (PAP 67 torr on echo, no RHC data) Mucous plugging and atelectasis 12/14, better on 12/15 P:   Decadron x3 doses starting 12/14 Low dose Codeine for cough Albuterol now and prn Goal SpO2>92 Supplemental oxygen, wean as  tolerated Dulera Flutter valve   CARDIOVASCULAR A:  Acute on chronic diastolic dysfunction resolved P:  Goal MAP> 60  RENAL A:  Mild hypokalemia (treated) P:   Goal even fluid balance KCL daily Trend BMP Lasix 20 daily  GASTROINTESTINAL A:   GERD.  Constipation.  Dysphagia.  Protein calorie malnutrition. P:   Protonix ( preadmission PPI) Dysphagia 2, thin liquid diet Dietary supplements/shakes per dietary  HEMATOLOGIC A:  Anemia of chronic disease / acute blood loss. P:  Trend CBC Lovenox for DVT prevention Fe  INFECTIOUS A:  Pseudomonas UTI.   P:   Abx / cultures as above Levaquin through 12/15 Ck repeat PCT given rising WBCs (may be due to decadron)  ENDOCRINE A:  Hypothyroidism. P:   Synthroid   NEUROLOGIC A:  Hx of multiple sclerosis, s/p MS flare, stable s/p 3 days high dose steroids  Seemingly a little better. Solu-Medrol completed Pain.   ICU delirium resolved P:   Morphine PRN Prozac, Restoril, Baclofen, Neurontin Risperdal 0.25 qHS  ORTHOPEDIC A:  R hip fracture, s/p hemiarthroplasty/ DISPO  P: PT/OT [facility] refusing CIR. Wonder about peer to peer review. Do not feel like she is a good SNF candidate   BABCOCK,PETE   03/07/2013, 12:17 PM   Attending:  I have seen and examined the patient with nurse practitioner/resident and agree with the note above.   Will work on peer to peer review as I think she would really do well with CIR and poorly with a SNF at this point.  CXR yesterday looks more like atelectasis left base rather than worsening consolidation.  She is doing better from a respiratory standpoint today but needs more pulmonary toilette.  Yolonda Kida PCCM Pager: [phone] Cell: [phone] If no response, call [phone]

## 2013-03-07 NOTE — Discharge Summary (Signed)
Physician Discharge Summary     Patient ID: Kayla [name] MRN: [id number] DOB/AGE: May 26, 1950 62 y.o.  Admit date: 02/23/2013 Discharge date: 03/08/2013  Discharge Diagnoses:  Active Problems:   HYPOTHYROIDISM   Multiple sclerosis   Fractured femoral neck   Hip fracture   Acute respiratory failure with hypoxia   Pain   CAP (community acquired pneumonia)   Unspecified constipation   HCAP (healthcare-associated pneumonia)   Acute diastolic heart failure   Vocal cord paralysis   Detailed Hospital Course:   62 y.o. female with PMH of relapsing MS followed by Dr.Douglas, with foot drop, Hypothyrodiism, osteoporosis and was walking 12/3 and tripped and had a mechanical fall due to a rug, subsequently started having R hip pain and was unable to ambulate, subsequently EMS was called and brought to [facility] ER. Of note just before this event had been in contact with her neurologist given concern for MS flare and had been prescribed steroid taper which she had not filled yet. In the ER she was found to have a Displaced right subcapital femoral neck fracture, EDP d/w Ortho Dr.Hewitt and [facility] was consulted for further evaluation and management. She was admitted to the medical ward. On 12/4 she developed increased respiratory distress, stridor,  and hypoxia resulting in transfer to the intensive care and subsequent PCCM consult. She was seen by ENT (Dr Emeline Darling) on 12/4 who found bilateral vocal fold paresis. CXR was worrisome for possible aspiration event. Pan-culture studies were sent, and she was started on empiric antibiotics. Urine studies were + for psuedomonas. She had worsening fevers, worsening atelectasis, hypoxia on 12/7. She required NIPPV support. Given her remarkable weakness which seemed out of proportion to her reason for admit we were concerned about MS flare. We had neurology consulted on 12/7 for this and she was started on 3 day course of high dose steroids. On 12/8 we felt that she was  clear for OR but while in the pre-op area she had an episode of desaturation and OR was postponed. We felt that this was transient mucous plugging due to her residual weakness and per our recommendations we felt it would be best for her to undergo surgery under general anesthesia instead of local which was the original plan. Because of the ENT recs she was intubated for general anesthesia using a smaller ETT. She underwent her right hemiarthroplasty on 12/9 by Dr Charlann Boxer. Post-op course was slow but progressive. This was complicated by cough in the setting of her vocal cord paralysis, and intermittent mucous plugging which was identified by CXR on 12/14 but improved with pulm hygiene measures. At time of discharge she is slowly improving but remains considerably debilitated due to her recent MS flare and residual effects of her UTI, aspiration pneumonia and hip surgery. She is clear for d/c to CIR on 12/15 with the following plan of care per active diagnosis      Discharge Plan by active diagnoses    R hip fracture, s/p hemiarthroplasty 12/9 Marked physical deconditioning History of multiple sclerosis s/p MS flare treated w/ high dose steroids from 12/7 to 12/10 P: Needs PT/OT and extensive rehab Cont Prozac, Restoril, Baclofen, Neurontin  Risperdal 0.25 qHS PRN analgesia   Cough with mild stridor (related to vocal cord paralysis) 12/15, no resp distress  Vocal cord paralysis, at baseline.  History of reactive airway disease, but no acute bronchospasm.  OSA.  Pulmonary hypertension (PAP 67 torr on echo, no RHC data)  Mucous plugging and atelectasis 12/14, better  on 12/15 Plan  Albuterol now and prn  Goal SpO2>92  Supplemental oxygen, wean as tolerated  Dulera  Flutter valve   Diastolic dysfunction (compensated) Plan Cont daily lasix w/ KCL supplementation Intermittent BMP f/u  GERD.  Constipation.  Dysphagia.  Protein calorie malnutrition.  P:  Protonix ( preadmission PPI)    Dysphagia 2, thin liquid diet  Dietary supplements/shakes per dietary  Anemia of chronic disease / acute blood loss.  P:  Trend CBC  Lovenox for DVT prevention  FeSo4 supplementation   Hypothyroidism.  P:  Synthroid   Significant Hospital tests/ studies/ interventions and procedures  SIGNIFICANT EVENTS / STUDIES:  12/03 Admitted with R hip fx  12/04 Transferred to ICU with stridor / hypoxemia, ENT consulted  12/04 Laryngoscopy >>> Bilateral vocal fold paresis bilaterally  12/06 TTE >>> EF 60, diastolic dysfunction, PAP 67 torr 12/07 Neurology consulted, steroids started  12/08 OR >>> canceled due to increased oxygen requirements  12/08 SLP >>> barium swallow study recommended  12/09 OR >>> R hip hemiarthroplasty  12/10 Modified barium swallow >>> Dysphagia 2 diet, thin liquid   LINES / TUBES:  R PICC 12/07 >>>   CULTURES:  12/04 MRSA PCR >>> neg  12/03 Urine >>> Pseudomonas (pansensetive)  12/05 Urine >>> Pseudomonas (pansensetive)  12/05 Blood >>> neg   ANTIBIOTICS:  Rocephin 12/05 x 1  Zithromax 12/05 >>>12/07  Vancomycin 12/05 >>> 12/09  Zosyn 12/05 >>> 12/07  Cefepime 12/07 >>> 12/07  Levaquin 12/07 >>>completed 12/15  Discharge Exam: BP 103/68  Pulse 82  Temp(Src) 98.7 F (37.1 C) (Oral)  Resp 20  Ht 5\' 3"  (1.6 m)  Wt 62.6 kg (138 lb 0.1 oz)  BMI 24.45 kg/m2  SpO2 96% Gen: sitting up in chair, weak cough  HEENT: NCAT, EOMi, OP clear  PULM: upper airway stridor decreased with cough, good air movement, but decreased in left  CV: RRR, no mgr, no JVD  AB: BS+, soft, nontender, no hsm  Ext: warm, no edema, no clubbing, no cyanosis  Derm: R hip wound site dressing c/d/i, no erythema, tenderness  Neuro: A&Ox4, CN II-XII intact, MAEW   Labs at discharge Lab Results  Component Value Date   CREATININE 0.60 03/08/2013   BUN 11 03/08/2013   NA 132* 03/08/2013   K 3.7 03/08/2013   CL 97 03/08/2013   CO2 27 03/08/2013   Lab Results  Component Value  Date   WBC 14.3* 03/08/2013   HGB 7.4* 03/08/2013   HCT 21.9* 03/08/2013   MCV 85.5 03/08/2013   PLT 283 03/08/2013   Lab Results  Component Value Date   ALT 13 02/28/2013   AST 23 02/28/2013   ALKPHOS 49 02/28/2013   BILITOT 0.3 02/28/2013   No results found for this basename: INR,  PROTIME    Current radiology studies Dg Chest 2 View  03/06/2013   CLINICAL DATA:  Shortness of breath and cough  EXAM: CHEST  2 VIEW  COMPARISON:  03/02/2013  FINDINGS: Cardiac shadow is within normal limits. The lungs are well aerated bilaterally although increasing left basilar infiltrate is noted. The low right lung remains clear. A right-sided PICC line is again seen and stable.  IMPRESSION: Significant increase in left lower lobe infiltrate when compared with the prior exam.   Electronically Signed   By: Alcide Clever M.D.   On: 03/06/2013 14:25   Dg Chest Port 1 View  03/07/2013   CLINICAL DATA:  Follow-up left-sided airspace disease.  EXAM: PORTABLE CHEST -  1 VIEW  COMPARISON:  03/06/2013  FINDINGS: Parenchymal changes left lung have improved slightly since the prior examination. This may reflect result of shifting of pleural fluid inferiorly.  Left perihilar infectious infiltrate cannot be excluded in proper clinical setting.  Asymmetric pulmonary edema not excluded although typically greater on the right.  No gross pneumothorax.  PICC line tip mid superior vena cava level.  IMPRESSION: Change in appearance of left thorax may reflect pleural fluid layering inferiorly. What remains may represent asymmetric pulmonary edema or left perihilar infectious infiltrate.   Electronically Signed   By: Bridgett Larsson M.D.   On: 03/07/2013 14:54    Disposition:  To [facility] Please set up follow up with Dr Sherene Sires after discharge.       Discharge Orders   Future Orders Complete By Expires   Call MD / Call 911  As directed    Comments:     If you experience chest pain or shortness of breath, CALL 911 and be  transported to the hospital emergency room.  If you develope a fever above 101 F, pus (white drainage) or increased drainage or redness at the wound, or calf pain, call your surgeon's office.   Change dressing  As directed    Comments:     Maintain surgical dressing for 14 days, or until return to the clinic. Keep the area dry and clean.   Constipation Prevention  As directed    Comments:     Drink plenty of fluids.  Prune juice may be helpful.  You may use a stool softener, such as Colace (over the counter) 100 mg twice a day.  Use MiraLax (over the counter) for constipation as needed.   Diet - low sodium heart healthy  As directed         Discharge instructions  As directed    Comments:     Maintain surgical dressing for 14 days, or until return to the clinic for follow up. Keep the area dry and clean until follow up. Follow up in 2 weeks at [facility]. Call with any questions or concerns.   Increase activity slowly as tolerated  As directed     As directed    TED hose  As directed    Comments:     Use stockings (TED hose) for 2 weeks on both leg(s).  You may remove them at night for sleeping.   Weight bearing as tolerated  As directed    Questions:     Laterality:     Extremity:         Medication List    STOP taking these medications       DEXILANT 60 MG capsule  Generic drug:  dexlansoprazole     naproxen 500 MG tablet  Commonly known as:  NAPROSYN     PRESCRIPTION MEDICATION      TAKE these medications       acetaminophen 325 MG tablet  Commonly known as:  TYLENOL  Take 2 tablets (650 mg total) by mouth every 6 (six) hours as needed for mild pain (or Fever >/= 101).     albuterol (5 MG/ML) 0.5% nebulizer solution  Commonly known as:  PROVENTIL  Take 0.5 mLs (2.5 mg total) by nebulization every 2 (two) hours as needed for wheezing or shortness of breath.     alum & mag hydroxide-simeth 200-200-20 MG/5ML suspension  Commonly known as:  MAALOX/MYLANTA    Take 30 mLs by mouth every 4 (four) hours  as needed for indigestion or heartburn.     aspirin EC 325 MG tablet  Take 1 tablet (325 mg total) by mouth 2 (two) times daily. Take for 4 weeks.     baclofen 20 MG tablet  Commonly known as:  LIORESAL  Take 20 mg by mouth. 1 1/2 tabs four times a day     calcium carbonate 500 MG chewable tablet  Commonly known as:  TUMS - dosed in mg elemental calcium  Chew 1 tablet (200 mg of elemental calcium total) by mouth every 6 (six) hours as needed for indigestion or heartburn.     cyanocobalamin 1000 MCG tablet  Take 1 tablet (1,000 mcg total) by mouth daily.     dalfampridine 10 MG Tb12  Commonly known as:  AMPYRA  Take 1 tablet (10 mg total) by mouth 2 (two) times daily.     DSS 100 MG Caps  Take 100 mg by mouth 2 (two) times daily.     enoxaparin 40 MG/0.4ML injection  Commonly known as:  LOVENOX  Inject 0.4 mLs (40 mg total) into the skin daily.     feeding supplement (RESOURCE BREEZE) Liqd  Take 1 Container by mouth 3 (three) times daily with meals.     ferrous sulfate 325 (65 FE) MG tablet  Take 1 tablet (325 mg total) by mouth 3 (three) times daily after meals.     FLUoxetine 40 MG capsule  Commonly known as:  PROZAC  Take 40 mg by mouth daily.     furosemide 40 MG tablet  Commonly known as:  LASIX  Take 1 tablet (40 mg total) by mouth daily.     gabapentin 600 MG tablet  Commonly known as:  NEURONTIN  Take 600 mg by mouth 2 (two) times daily.     guaiFENesin-codeine 100-10 MG/5ML syrup  Take 5 mLs by mouth every 12 (twelve) hours as needed for cough.     HYDROcodone-acetaminophen 7.5-325 MG per tablet  Commonly known as:  NORCO  Take 1-2 tablets by mouth every 4 (four) hours as needed for moderate pain.     levothyroxine 75 MCG tablet  Commonly known as:  SYNTHROID  Take 1 tablet (75 mcg total) by mouth daily.     mometasone-formoterol 100-5 MCG/ACT Aero  Commonly known as:  DULERA  Inhale 2 puffs into the lungs 2  (two) times daily.     pantoprazole 40 MG tablet  Commonly known as:  PROTONIX  Take 1 tablet (40 mg total) by mouth 2 (two) times daily.     polyethylene glycol packet  Commonly known as:  MIRALAX / GLYCOLAX  Take 17 g by mouth daily.     potassium chloride SA 20 MEQ tablet  Commonly known as:  K-DUR,KLOR-CON  Take 1 tablet (20 mEq total) by mouth daily.     RESTORIL 15 MG capsule  Generic drug:  temazepam  Take 15 mg by mouth daily as needed.     risperiDONE 0.25 MG tablet  Commonly known as:  RISPERDAL  Take 1 tablet (0.25 mg total) by mouth at bedtime.     senna 8.6 MG Tabs tablet  Commonly known as:  SENOKOT  Take 2 tablets (17.2 mg total) by mouth at bedtime.     sodium chloride 0.9 % injection  10-40 mLs by Intracatheter route every 12 (twelve) hours.     TOVIAZ 4 MG Tb24 tablet  Generic drug:  fesoterodine  Take 4 mg by mouth daily.  Follow-up Information   Follow up with Shelda Pal, MD. Schedule an appointment as soon as possible for a visit in 2 weeks.   Specialty:  Orthopedic Surgery   Contact information:   [address] [place] [address] [phone]       Discharged Condition: good  Physician Statement:   The Patient was personally examined, the discharge assessment and plan has been personally reviewed and I agree with ACNP Babcock's assessment and plan. > 30 minutes of time have been dedicated to discharge assessment, planning and discharge instructions.   SignedAnders Simmonds 03/08/2013, 12:03 PM   Coralyn Helling, MD [facility] 03/08/2013, 12:07 PM Pager:  [phone] After 3pm call: [phone]

## 2013-03-07 NOTE — Progress Notes (Signed)
CARE MANAGEMENT NOTE 03/07/2013  Patient:  Kayla [name], Kayla [name]   Account Number:  [id number]  Date Initiated:  02/24/2013  Documentation initiated by:  Colleen Can  Subjective/Objective Assessment:   dx displaced rt femorl neck fracture following a fall     Action/Plan:   Pt will be having surgery. CM will follow post op   Anticipated DC Date:  03/10/2013   Anticipated DC Plan:  IP REHAB FACILITY  In-house referral  Clinical Social Worker      DC Planning Services  CM consult      Choice offered to / List presented to:             Status of service:  In process, will continue to follow Medicare Important Message given?   (If response is "NO", the following Medicare IM given date fields will be blank) Date Medicare IM given:   Date Additional Medicare IM given:    Discharge Disposition:    Per UR Regulation:  Reviewed for med. necessity/level of care/duration of stay  If discussed at Long Length of Stay Meetings, dates discussed:    Comments:  [id number]/Rhonda Stark Jock, BSN, [place] [phone] Chart Reviewed for discharge and hospital needs. Discharge needs at time of review:  None present will follow for needs. Review of patient progress due on [id number].  [id number]/QIONGE Earlene Plater.,RN,BSN,CCM: o2 demands decreasing weaning o2 and awaiting CIR evaluation.  [id number]/GMWNUU Earlene Plater, RN, BSN, CCM [phone] Chart Reviewed for discharge and hospital needs.  Surgical repair of the hip postponed due to resp status, 02 down to75% fi02 increased to 40%. Discharge needs at time of review:  None present will follow for needs. Review of patient progress due on [id number].   [id number]/pt scheduled for or today for  follow post op/Rhonda Davis,RN,BSN,CCM  02/24/2013 Colleen Can BSN RN CCM [phone] Pt was transferred to [facility] unit d/c decline in health status.

## 2013-03-07 NOTE — Progress Notes (Signed)
Occupational Therapy Treatment Patient Details Name: Kayla [name] MRN: [id number] DOB: 01-20-1951 Today's Date: 03/07/2013 Time: 5621-3086 OT Time Calculation (min): 47 min  OT Assessment / Plan / Recommendation  History of present illness Pt. admitted 12/4 after trip and fall, R subcapital hip fx. Began respiratory distress, surgery for hemiarthroplasty performed 03/01/13. Pt has h/o MS.   OT comments  Pt is limited by fatigue and O2 sats dropping with activity. Nursing made aware. Worked on PLB when SOB to help with recovery. See CIR admissions coordinator note from today. Recommend SNF at d/c.     Follow Up Recommendations  SNF;Supervision/Assistance - 24 hour    Barriers to Discharge       Equipment Recommendations  3 in 1 bedside comode    Recommendations for Other Services    Frequency Min 2X/week   Progress towards OT Goals Progress towards OT goals: Progressing toward goals  Plan Discharge plan needs to be updated    Precautions / Restrictions Precautions Precautions: Fall;Posterior Hip Precaution Comments: THP sign hung in room. reviewed all hip precautions with pt Restrictions Weight Bearing Restrictions: No RLE Weight Bearing: Weight bearing as tolerated   Pertinent Vitals/Pain After amb to BR and back to recliner, dropped to 78% 2 l., HR 128.  O2 incr to 4 l for recovery back to > 90% and HR decr to 90's. RN aware of desatting.     ADL  Toilet Transfer: Performed;+2 Total assistance Toilet Transfer: Patient Percentage: 60% Acupuncturist: Comfort height toilet Toileting - Clothing Manipulation and Hygiene: Performed;+2 Total assistance Toileting - Clothing Manipulation and Hygiene: Patient Percentage: 0% Where Assessed - Toileting Clothing Manipulation and Hygiene: Sit to stand from 3-in-1 or toilet Equipment Used: Long-handled shoe horn;Long-handled sponge;Reacher;Rolling walker;Sock aid ADL Comments: Demonstrated all AE to pt after practice of  toilet transfer. Explained coverage. Pt fatigued after toilet transfer so didnt practice hands on with AE but pt verbalized understanding of different uses. Will benefit from practice. Pt with difficulty rising from comfort height commode as she couldn't reach well enough to grab bar to help her with standing. She will benefit from 3in1 to use armrests for support during sit to stand and stand to sit transitions.    OT Diagnosis:    OT Problem List:   OT Treatment Interventions:     OT Goals(current goals can now be found in the care plan section)    Visit Information  Last OT Received On: 03/07/13 Assistance Needed: +2 Reason for Co-Treatment: Complexity of the patient's impairments (multi-system involvement);For patient/therapist safety PT goals addressed during session: Mobility/safety with mobility;Proper use of DME;Strengthening/ROM;Other (comment) (hip precautions) OT goals addressed during session: ADL's and self-care;Proper use of Adaptive equipment and DME;Strengthening/ROM History of Present Illness: Pt. admitted 12/4 after trip and fall, R subcapital hip fx. Began respiratory distress, surgery for hemiarthroplasty performed 03/01/13. Pt has h/o MS.    Subjective Data      Prior Functioning       Cognition  Cognition Arousal/Alertness: Awake/alert Behavior During Therapy: WFL for tasks assessed/performed Overall Cognitive Status: Within Functional Limits for tasks assessed    Mobility  Bed Mobility Bed Mobility: Not assessed Transfers Sit to Stand: 1: +2 Total assist;With upper extremity assist;From chair/3-in-1;From bed Sit to Stand: Patient Percentage: 50% Stand to Sit: 1: +2 Total assist;To chair/3-in-1;With upper extremity assist Stand to Sit: Patient Percentage: 60% Details for Transfer Assistance:   VC's on proper technique and to adhere to THP.  Increased time and assist  for controlled decent to recliner.  cues for posture to facilitate taking a step. Much more  difficulty from toilet without 3in1 AND ARMRESTS.       Balance     End of Session OT - End of Session Activity Tolerance: Patient limited by fatigue Patient left: in chair;with call bell/phone within reach  GO     [name] [phone] 03/07/2013, 11:25 AM

## 2013-03-07 NOTE — Progress Notes (Signed)
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    Imaging results below:    03/06/2013   CLINICAL DATA:  CXR:  Shortness of breath and cough   IMPRESSION: Significant increase in left lower lobe infiltrate when compared with the prior exam.     03/07/2013   CLINICAL DATA:  Follow-up left-sided airspace disease.  CXR   IMPRESSION: Change in appearance of left thorax may reflect pleural fluid layering inferiorly. What remains may represent asymmetric pulmonary edema or left perihilar infectious infiltrate.   Electronically Signed   By: Bridgett Larsson M.D.   On: 03/07/2013 14:54    SLP reviewed results of MBS from 12/10, clinical reasoning for compensation strategies, and educated to aspiration mitigation precautions.  Pt's vocal fold paresis was diagnosed early 2006 per patient.  She reports undergoing a laryngoscopy with removal of a cyst from vocal folds in 2006.   Dr [name] (ENT) follow pt on outpatient basis per pt statement and pt reports current phonatory ability is not at baseline yet.    Intake has been poor since admitted to hospital due to poor appetitie and displeasure with food choices per pt statement.    SLP observed pt consuming graham cracker and water - no overt s/s of aspiration noted nor increased work of breathing.   Pt will be chronic aspiration risk due to her vocal fold paresis and MS, suspect component of low grade chronic aspiration based on report of choking with liquids prior to admission.  She now reports issues with coughing when laying down at night, denies reflux symptoms however.    Recommend to advance to  soft/thin with usage of compensation strategies to continue to mitigate aspiration/dysphagia.   SLP to follow.    HPI HPI: Kayla [name] is a 62 y.o. female with PMH of relapsing MS followed by Dr.Douglas, with foot drop, Hypothyrodiism, osteoporosis.  Pt was walking 02/23/13,  tripped and fell due to a rug, and subsequently started having R hip pain and was unable to ambulate.  EMS was called and brought pt to [facility] ER.  She was found to have a  Displaced right subcapital femoral neck fracture. MBS was completed 03/02/13, with recommendation for dys 2 diet with thin liquids.  SLP to see pt to determine tolerance of po diet, utlization of compensation strategies and for pt/family education.    Pertinent Vitals Low grade temp, decreased- congested cough  SLP Plan  Continue with current plan of care    Recommendations Diet recommendations: Dysphagia 3 (mechanical soft);Thin liquid Liquids provided via: Cup;No straw Medication Administration: Whole meds with puree (crush if large and not contraindicated) Supervision: Staff to assist with self feeding;Full supervision/cueing for compensatory strategies;Trained caregiver to feed patient Compensations: Slow rate;Small sips/bites;Multiple dry swallows after each bite/sip;Follow solids with liquid Postural Changes and/or Swallow Maneuvers: Seated upright 90 degrees;Upright 30-60 min after meal (tuck chin with solids)              Oral Care Recommendations: Oral care BID Follow up Recommendations: 24 hour supervision/assistance;Inpatient Rehab Plan: Continue with current plan of care    GO     Mills Koller, MS [facility] SLP [phone]

## 2013-03-07 NOTE — Progress Notes (Signed)
Rehab admissions - I received a call from PA.  I have called [employer] on site reviewer and I have opened the case requesting acute inpatient rehab admission.  I will update all when I hear back from insurance carrier.  Call me for questions.  #[phone]

## 2013-03-07 NOTE — Progress Notes (Addendum)
CSW continuing to follow for disposition planning.  CSW noted that recommendation is for CIR, but at this time pt insurance will not authorize for placement to CIR. Per chart and discussion with family, MD plans to do peer to peer review in order to advocate for CIR placement.   CSW met with pt and pt husband at bedside. Pt had two visitors at this time which pt and pt spouse were agreeable to CSW speaking in front of the visitors. CSW discussed that CSW is aware that CIR is recommendations and MD is advocating for CIR. CSW explained that CSW wanted to provide SNF bed offers in order for family to have in case insurance continues to refuse to cover CIR placement.   Pt husband expressed understanding and pt and pt husband discussed that if pt insurance will not cover CIR then pt and pt family will likely have pt return home with [facility] and 24 hour care. Pt and pt husband appreciative of bed offers in order to have as a resource and be aware of all options available.   Pt and pt spouse are satisfied with pt progress thus far and want to ensure that the discharge plan is the best for pt in order to have pt in the best environment for continued progression.  CSW to continue to follow and assist with disposition needs.   Jacklynn Lewis, MSW, LCSW Clinical Social Work [phone]

## 2013-03-07 NOTE — Progress Notes (Signed)
Rehab admissions - Evaluated for possible admission.  I spoke with the [facility] on site reviewer.  We will not be able to get authorization for acute inpatient rehab admission.  Options likely to be SNF or home with [facility] if patient prefers.  Call me for questions.  #[phone]

## 2013-03-07 NOTE — Progress Notes (Signed)
PHARMACIST - PHYSICIAN COMMUNICATION CONCERNING:  IV to Oral Route Change Policy  RECOMMENDATION: This patient is receiving Protonix by the intravenous route.  Based on criteria approved by the Pharmacy and Therapeutics Committee, Protonix is being converted to the equivalent oral dose form(s).  DESCRIPTION: These criteria include:  Has a documented ability to take oral medications (tolerating diet of full liquids or better or  Gastric tube feedings for >24 hours OR taking other scheduled oral medications for >24 hours).  Expected plan for continued treatment for at least 1 day.  If you have questions about this conversion, please contact the Pharmacy Department  []   [phone] )  [facility] []   [phone] )  [facility]  []   [phone] )  [facility] [x]   [phone] )  [facility]   Geoffry Paradise, PharmD, [place] Pager: [phone] 8:34 AM Pharmacy #: [phone]

## 2013-03-08 ENCOUNTER — Inpatient Hospital Stay (HOSPITAL_COMMUNITY)
Admission: RE | Admit: 2013-03-08 | Discharge: 2013-03-12 | DRG: 945 | Disposition: A | Discharge status: Home / Self Care | Payer: 59 | Source: Intra-hospital | Attending: Physical Medicine & Rehabilitation | Admitting: Physical Medicine & Rehabilitation

## 2013-03-08 DIAGNOSIS — D72829 Elevated white blood cell count, unspecified: Secondary | ICD-10-CM | POA: Diagnosis present

## 2013-03-08 DIAGNOSIS — T84029A Dislocation of unspecified internal joint prosthesis, initial encounter: Secondary | ICD-10-CM

## 2013-03-08 DIAGNOSIS — M549 Dorsalgia, unspecified: Secondary | ICD-10-CM

## 2013-03-08 DIAGNOSIS — S73005A Unspecified dislocation of left hip, initial encounter: Secondary | ICD-10-CM

## 2013-03-08 DIAGNOSIS — Z96649 Presence of unspecified artificial hip joint: Secondary | ICD-10-CM

## 2013-03-08 DIAGNOSIS — S72009A Fracture of unspecified part of neck of unspecified femur, initial encounter for closed fracture: Secondary | ICD-10-CM

## 2013-03-08 DIAGNOSIS — S73004A Unspecified dislocation of right hip, initial encounter: Secondary | ICD-10-CM

## 2013-03-08 DIAGNOSIS — Z5189 Encounter for other specified aftercare: Principal | ICD-10-CM

## 2013-03-08 DIAGNOSIS — R339 Retention of urine, unspecified: Secondary | ICD-10-CM | POA: Diagnosis present

## 2013-03-08 DIAGNOSIS — G35 Multiple sclerosis: Secondary | ICD-10-CM | POA: Diagnosis present

## 2013-03-08 DIAGNOSIS — N390 Urinary tract infection, site not specified: Secondary | ICD-10-CM | POA: Diagnosis present

## 2013-03-08 DIAGNOSIS — D62 Acute posthemorrhagic anemia: Secondary | ICD-10-CM | POA: Diagnosis present

## 2013-03-08 DIAGNOSIS — W19XXXA Unspecified fall, initial encounter: Secondary | ICD-10-CM

## 2013-03-08 DIAGNOSIS — J9621 Acute and chronic respiratory failure with hypoxia: Secondary | ICD-10-CM | POA: Diagnosis present

## 2013-03-08 DIAGNOSIS — B965 Pseudomonas (aeruginosa) (mallei) (pseudomallei) as the cause of diseases classified elsewhere: Secondary | ICD-10-CM | POA: Diagnosis present

## 2013-03-08 DIAGNOSIS — E871 Hypo-osmolality and hyponatremia: Secondary | ICD-10-CM | POA: Diagnosis present

## 2013-03-08 DIAGNOSIS — J9601 Acute respiratory failure with hypoxia: Secondary | ICD-10-CM | POA: Diagnosis present

## 2013-03-08 DIAGNOSIS — J3800 Paralysis of vocal cords and larynx, unspecified: Secondary | ICD-10-CM | POA: Diagnosis present

## 2013-03-08 DIAGNOSIS — Y92009 Unspecified place in unspecified non-institutional (private) residence as the place of occurrence of the external cause: Secondary | ICD-10-CM

## 2013-03-08 DIAGNOSIS — J189 Pneumonia, unspecified organism: Secondary | ICD-10-CM

## 2013-03-08 DIAGNOSIS — C539 Malignant neoplasm of cervix uteri, unspecified: Secondary | ICD-10-CM | POA: Diagnosis present

## 2013-03-08 DIAGNOSIS — J690 Pneumonitis due to inhalation of food and vomit: Secondary | ICD-10-CM | POA: Diagnosis present

## 2013-03-08 DIAGNOSIS — E039 Hypothyroidism, unspecified: Secondary | ICD-10-CM | POA: Diagnosis present

## 2013-03-08 LAB — CBC
HCT: 21.9 % — ABNORMAL LOW (ref 36.0–46.0)
Hemoglobin: 7.4 g/dL — ABNORMAL LOW (ref 12.0–15.0)
MCH: 28.9 pg (ref 26.0–34.0)
MCHC: 33.8 g/dL (ref 30.0–36.0)
MCV: 85.5 fL (ref 78.0–100.0)
Platelets: 283 10*3/uL (ref 150–400)
RBC: 2.56 MIL/uL — ABNORMAL LOW (ref 3.87–5.11)
RDW: 14.6 % (ref 11.5–15.5)
WBC: 14.3 10*3/uL — ABNORMAL HIGH (ref 4.0–10.5)

## 2013-03-08 LAB — BASIC METABOLIC PANEL
BUN: 11 mg/dL (ref 6–23)
CO2: 27 mEq/L (ref 19–32)
Calcium: 8.2 mg/dL — ABNORMAL LOW (ref 8.4–10.5)
Chloride: 97 mEq/L (ref 96–112)
Creatinine, Ser: 0.6 mg/dL (ref 0.50–1.10)
GFR calc Af Amer: >90 mL/min (ref >90)
GFR calc non Af Amer: >90 mL/min (ref >90)
Glucose, Bld: 96 mg/dL (ref 70–99)
Potassium: 3.7 mEq/L (ref 3.5–5.1)
Sodium: 132 mEq/L — ABNORMAL LOW (ref 135–145)

## 2013-03-08 LAB — PROCALCITONIN: Procalcitonin: <0.1 ng/mL

## 2013-03-08 MED ORDER — ALBUTEROL SULFATE (5 MG/ML) 0.5% IN NEBU
2.5000 mg | INHALATION_SOLUTION | RESPIRATORY_TRACT | Status: DC | PRN
  Filled 2013-03-08: qty 0.5

## 2013-03-08 MED ORDER — ACETAMINOPHEN 325 MG PO TABS: 650.0000 mg | ORAL_TABLET | Freq: Four times a day (QID) | ORAL | Status: DC | PRN

## 2013-03-08 MED ORDER — TRAZODONE HCL 50 MG PO TABS
25.0000 mg | ORAL_TABLET | Freq: Every evening | ORAL | Status: DC | PRN
  Administered 2013-03-09: 25 mg via ORAL
  Filled 2013-03-08: qty 1

## 2013-03-08 MED ORDER — SENNA 8.6 MG PO TABS: 2.0000 | ORAL_TABLET | Freq: Every day | ORAL | Status: DC

## 2013-03-08 MED ORDER — PANTOPRAZOLE SODIUM 40 MG PO TBEC: 40.0000 mg | DELAYED_RELEASE_TABLET | Freq: Two times a day (BID) | ORAL | Status: DC

## 2013-03-08 MED ORDER — ALUM & MAG HYDROXIDE-SIMETH 200-200-20 MG/5ML PO SUSP: 30.0000 mL | ORAL | Status: DC | PRN

## 2013-03-08 MED ORDER — BACLOFEN 20 MG PO TABS
20.0000 mg | ORAL_TABLET | Freq: Four times a day (QID) | ORAL | Status: DC | PRN
  Filled 2013-03-08: qty 1.5

## 2013-03-08 MED ORDER — VITAMIN B-12 1000 MCG PO TABS
1000.0000 ug | ORAL_TABLET | Freq: Every day | ORAL | Status: DC
  Administered 2013-03-09 – 2013-03-12 (×4): 1000 ug via ORAL
  Filled 2013-03-08 (×5): qty 1

## 2013-03-08 MED ORDER — SODIUM CHLORIDE 0.9 % IJ SOLN: 10.0000 mL | Freq: Two times a day (BID) | INTRAMUSCULAR | Status: DC

## 2013-03-08 MED ORDER — POTASSIUM CHLORIDE CRYS ER 20 MEQ PO TBCR: 20.0000 meq | EXTENDED_RELEASE_TABLET | Freq: Every day | ORAL | Status: DC

## 2013-03-08 MED ORDER — RISPERIDONE 0.25 MG PO TABS
0.2500 mg | ORAL_TABLET | Freq: Every day | ORAL | Status: DC
  Administered 2013-03-08 – 2013-03-11 (×4): 0.25 mg via ORAL
  Filled 2013-03-08 (×5): qty 1

## 2013-03-08 MED ORDER — PROCHLORPERAZINE EDISYLATE 5 MG/ML IJ SOLN
5.0000 mg | Freq: Four times a day (QID) | INTRAMUSCULAR | Status: DC | PRN
  Filled 2013-03-08: qty 2

## 2013-03-08 MED ORDER — GUAIFENESIN-CODEINE 100-10 MG/5ML PO SOLN
5.0000 mL | Freq: Two times a day (BID) | ORAL | Status: DC | PRN
  Filled 2013-03-08: qty 5

## 2013-03-08 MED ORDER — DIPHENHYDRAMINE HCL 12.5 MG/5ML PO ELIX
12.5000 mg | ORAL_SOLUTION | Freq: Four times a day (QID) | ORAL | Status: DC | PRN
  Administered 2013-03-09: 25 mg via ORAL
  Filled 2013-03-08: qty 10

## 2013-03-08 MED ORDER — BOOST / RESOURCE BREEZE PO LIQD: 1.0000 | Freq: Three times a day (TID) | ORAL | Status: DC

## 2013-03-08 MED ORDER — LEVOFLOXACIN IN D5W 750 MG/150ML IV SOLN
750.0000 mg | INTRAVENOUS | Status: AC
  Administered 2013-03-08: 750 mg via INTRAVENOUS
  Filled 2013-03-08: qty 150

## 2013-03-08 MED ORDER — PROCHLORPERAZINE 25 MG RE SUPP
12.5000 mg | Freq: Four times a day (QID) | RECTAL | Status: DC | PRN
  Filled 2013-03-08: qty 1

## 2013-03-08 MED ORDER — POLYETHYLENE GLYCOL 3350 17 G PO PACK
17.0000 g | PACK | Freq: Every day | ORAL | Status: DC
  Administered 2013-03-09 – 2013-03-12 (×4): 17 g via ORAL
  Filled 2013-03-08 (×5): qty 1

## 2013-03-08 MED ORDER — DALFAMPRIDINE ER 10 MG PO TB12: 10.0000 mg | ORAL_TABLET | Freq: Two times a day (BID) | ORAL | Status: DC

## 2013-03-08 MED ORDER — BOOST / RESOURCE BREEZE PO LIQD
1.0000 | Freq: Three times a day (TID) | ORAL | Status: DC
  Administered 2013-03-08 – 2013-03-12 (×12): 1 via ORAL

## 2013-03-08 MED ORDER — ENOXAPARIN SODIUM 40 MG/0.4ML ~~LOC~~ SOLN: 40.0000 mg | SUBCUTANEOUS | Status: DC

## 2013-03-08 MED ORDER — GUAIFENESIN-CODEINE 100-10 MG/5ML PO SOLN: 5.0000 mL | Freq: Two times a day (BID) | ORAL | Status: DC | PRN

## 2013-03-08 MED ORDER — LEVOTHYROXINE SODIUM 75 MCG PO TABS
75.0000 ug | ORAL_TABLET | Freq: Every day | ORAL | Status: DC
  Administered 2013-03-09 – 2013-03-12 (×4): 75 ug via ORAL
  Filled 2013-03-08 (×5): qty 1

## 2013-03-08 MED ORDER — PANTOPRAZOLE SODIUM 40 MG PO TBEC
40.0000 mg | DELAYED_RELEASE_TABLET | Freq: Two times a day (BID) | ORAL | Status: DC
  Administered 2013-03-08 – 2013-03-12 (×8): 40 mg via ORAL
  Filled 2013-03-08 (×7): qty 1

## 2013-03-08 MED ORDER — ALTEPLASE 2 MG IJ SOLR
2.0000 mg | Freq: Once | INTRAMUSCULAR | Status: AC
  Administered 2013-03-08: 2 mg
  Filled 2013-03-08: qty 2

## 2013-03-08 MED ORDER — RISPERIDONE 0.25 MG PO TABS: 0.2500 mg | ORAL_TABLET | Freq: Every day | ORAL | Status: DC

## 2013-03-08 MED ORDER — ALBUTEROL SULFATE (5 MG/ML) 0.5% IN NEBU: 2.5000 mg | INHALATION_SOLUTION | RESPIRATORY_TRACT | Status: DC | PRN

## 2013-03-08 MED ORDER — BISACODYL 10 MG RE SUPP: 10.0000 mg | Freq: Every day | RECTAL | Status: DC | PRN

## 2013-03-08 MED ORDER — CYANOCOBALAMIN 1000 MCG PO TABS: 1000.0000 ug | ORAL_TABLET | Freq: Every day | ORAL | Status: DC

## 2013-03-08 MED ORDER — POLYETHYLENE GLYCOL 3350 17 G PO PACK: 17.0000 g | PACK | Freq: Every day | ORAL | Status: DC

## 2013-03-08 MED ORDER — FUROSEMIDE 40 MG PO TABS: 40.0000 mg | ORAL_TABLET | Freq: Every day | ORAL | Status: DC

## 2013-03-08 MED ORDER — CALCIUM CARBONATE ANTACID 500 MG PO CHEW: 1.0000 | CHEWABLE_TABLET | Freq: Four times a day (QID) | ORAL | Status: DC | PRN

## 2013-03-08 MED ORDER — FERROUS SULFATE 325 (65 FE) MG PO TABS
325.0000 mg | ORAL_TABLET | Freq: Three times a day (TID) | ORAL | Status: DC
  Administered 2013-03-08 – 2013-03-12 (×12): 325 mg via ORAL
  Filled 2013-03-08 (×14): qty 1

## 2013-03-08 MED ORDER — DALFAMPRIDINE ER 10 MG PO TB12
10.0000 mg | ORAL_TABLET | Freq: Two times a day (BID) | ORAL | Status: DC
  Filled 2013-03-08: qty 1

## 2013-03-08 MED ORDER — RACEPINEPHRINE HCL 2.25 % IN NEBU
0.5000 mL | INHALATION_SOLUTION | RESPIRATORY_TRACT | Status: DC | PRN
  Filled 2013-03-08: qty 0.5

## 2013-03-08 MED ORDER — FLEET ENEMA 7-19 GM/118ML RE ENEM: 1.0000 | ENEMA | Freq: Every day | RECTAL | Status: DC | PRN

## 2013-03-08 MED ORDER — GUAIFENESIN-DM 100-10 MG/5ML PO SYRP: 5.0000 mL | ORAL_SOLUTION | Freq: Four times a day (QID) | ORAL | Status: DC | PRN

## 2013-03-08 MED ORDER — DOCUSATE SODIUM 100 MG PO CAPS
100.0000 mg | ORAL_CAPSULE | Freq: Two times a day (BID) | ORAL | Status: DC
  Administered 2013-03-08 – 2013-03-12 (×8): 100 mg via ORAL
  Filled 2013-03-08 (×11): qty 1

## 2013-03-08 MED ORDER — DSS 100 MG PO CAPS: 100.0000 mg | ORAL_CAPSULE | Freq: Two times a day (BID) | ORAL | Status: DC

## 2013-03-08 MED ORDER — MOMETASONE FURO-FORMOTEROL FUM 100-5 MCG/ACT IN AERO
2.0000 | INHALATION_SPRAY | Freq: Two times a day (BID) | RESPIRATORY_TRACT | Status: DC
  Administered 2013-03-08 – 2013-03-12 (×7): 2 via RESPIRATORY_TRACT
  Filled 2013-03-08: qty 8.8

## 2013-03-08 MED ORDER — GABAPENTIN 300 MG PO CAPS
600.0000 mg | ORAL_CAPSULE | Freq: Two times a day (BID) | ORAL | Status: DC
  Administered 2013-03-08 – 2013-03-12 (×9): 600 mg via ORAL
  Filled 2013-03-08 (×10): qty 2

## 2013-03-08 MED ORDER — SODIUM CHLORIDE 0.9 % IJ SOLN
10.0000 mL | Freq: Two times a day (BID) | INTRAMUSCULAR | Status: DC
  Administered 2013-03-08: 30 mL

## 2013-03-08 MED ORDER — MOMETASONE FURO-FORMOTEROL FUM 100-5 MCG/ACT IN AERO: 2.0000 | INHALATION_SPRAY | Freq: Two times a day (BID) | RESPIRATORY_TRACT | Status: DC

## 2013-03-08 MED ORDER — PROCHLORPERAZINE MALEATE 5 MG PO TABS
5.0000 mg | ORAL_TABLET | Freq: Four times a day (QID) | ORAL | Status: DC | PRN
  Filled 2013-03-08: qty 2

## 2013-03-08 MED ORDER — CALCIUM CARBONATE ANTACID 500 MG PO CHEW
1.0000 | CHEWABLE_TABLET | Freq: Four times a day (QID) | ORAL | Status: DC | PRN
  Filled 2013-03-08: qty 1

## 2013-03-08 MED ORDER — FUROSEMIDE 10 MG/ML IJ SOLN
20.0000 mg | Freq: Every day | INTRAMUSCULAR | Status: DC
  Filled 2013-03-08 (×2): qty 2

## 2013-03-08 MED ORDER — FLUOXETINE HCL 20 MG PO CAPS
40.0000 mg | ORAL_CAPSULE | Freq: Every day | ORAL | Status: DC
  Administered 2013-03-09 – 2013-03-12 (×4): 40 mg via ORAL
  Filled 2013-03-08 (×5): qty 2

## 2013-03-08 MED ORDER — SODIUM CHLORIDE 0.9 % IJ SOLN
10.0000 mL | INTRAMUSCULAR | Status: DC | PRN
  Administered 2013-03-09: 20 mL
  Administered 2013-03-10 – 2013-03-12 (×4): 30 mL

## 2013-03-08 MED ORDER — ENOXAPARIN SODIUM 40 MG/0.4ML ~~LOC~~ SOLN
40.0000 mg | SUBCUTANEOUS | Status: DC
  Administered 2013-03-08 – 2013-03-12 (×5): 40 mg via SUBCUTANEOUS
  Filled 2013-03-08 (×6): qty 0.4

## 2013-03-08 MED ORDER — POTASSIUM CHLORIDE CRYS ER 20 MEQ PO TBCR
20.0000 meq | EXTENDED_RELEASE_TABLET | Freq: Every day | ORAL | Status: DC
  Administered 2013-03-09 – 2013-03-12 (×4): 20 meq via ORAL
  Filled 2013-03-08 (×5): qty 1

## 2013-03-08 MED ORDER — ACETAMINOPHEN 325 MG PO TABS
325.0000 mg | ORAL_TABLET | ORAL | Status: DC | PRN
  Administered 2013-03-09 – 2013-03-10 (×2): 650 mg via ORAL
  Filled 2013-03-08 (×3): qty 2

## 2013-03-08 MED ORDER — SENNA 8.6 MG PO TABS
2.0000 | ORAL_TABLET | Freq: Every day | ORAL | Status: DC
  Administered 2013-03-08 – 2013-03-11 (×4): 17.2 mg via ORAL
  Filled 2013-03-08 (×8): qty 2

## 2013-03-08 NOTE — Progress Notes (Signed)
Rehab admissions - I have authorization for acute inpatient rehab admission for today.  Bed available and can admit to inpatient rehab today if okay with attending MD.  Call me for questions.  #[phone]

## 2013-03-08 NOTE — Interval H&P Note (Signed)
Kayla [name] was admitted today to Inpatient Rehabilitation with the diagnosis of right femoral neck fx.  [name] history has been reviewed, patient examined, and there is no change in status.  Patient continues to be appropriate for intensive inpatient rehabilitation.  I have reviewed the patient's chart and labs.  Questions were answered to the patient's satisfaction.  SWARTZ,ZACHARY T 03/08/2013, 8:49 PM

## 2013-03-08 NOTE — PMR Pre-admission (Signed)
PMR Admission Coordinator Pre-Admission Assessment  Patient: Kayla [name] is an 62 y.o., female MRN: [id number] DOB: 01/22/1951 Height: 5\' 3"  (160 cm) Weight: 62.6 kg (138 lb 0.1 oz)              Insurance Information HMO:      PPO:       PCP:       IPA:       80/20:       OTHER:  POS plan PRIMARY: UHC      Policy#: [id number]      Subscriber: Lara Mulch CM Name: Bertram Denver      Phone#: [phone]     Fax#:   Pre-Cert#: [id number]      Employer: Spouse works FT Benefits:  Phone #: [phone]     Name: Talbert Forest. Date: 05/01/12     Deduct: $1000(met)      Out of Pocket Max: $3000(met)      Life Max: unlimited CIR: 80%      SNF: 80%  60 days/year Outpatient: 80%  20 vistis PT/OT     Co-Pay: 20% Home Health: 80%  60 visits/year      Co-Pay: 20% DME: 80%     Co-Pay: 20% Providers: in Surveyor, quantity Information   Name Relation Home Work Mobile   Blanck,Frank Spouse [phone]    Hardin,Bobbie Relative   [phone]     Current Medical History  Patient Admitting Diagnosis:  R FN fx, MS exacerbation  History of Present Illness:  A 62 y.o. 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 Surgical intervention recommended by Dr. Charlann Boxer but on 02/24/13 she developed dyspnea and hypoxia with intermittent stridor and was transferred to ICU. Dr. Emeline Darling consulted for input and laryngoscopy with stable bilateral true vocal fold paresis, narrow~56mm glottic airway anteriorly----ok for intubation. She developed fever on 12/05 due to pseudomonas UTI as well as suspicion of aspiration PNA and was treated broad antibiotic coverage. Neurology consulted for input on worsening of weakness as family reported increased fatigue  and weakness lower and upper extremities prior to admission with recommendation to start steroid dose pack. She was treated with IV solumedrol X three days per Dr. Delight Ovens input. Has completed 10 day course of Levaquin today--procalcitonin levels <0.10.  Once stabilized, she underwent right hip hemi by Dr. Charlann Boxer on 03/01/13 and placed on lovenox for DVT prophylaxis. She was extubated without difficulty and MBS done post op to evaluate swallow function. MBS done revealed moderate pharyngeal and cervical esophageal dysphagia and patient placed on D2, thin liquids. She has had issues with pain and lethargy but is showing some improvement in participation with therapy. Continues to have increased oxygen needs with activity. CXR with left base atelectasis rather than consolidation and pulmonary toilet recommended by CCM. As mentation improving, diet advanced to D3, thin with compensatory strategies. Urinary retention treated with foley  Therapies ongoing and CIR recommended for progression.     Past Medical History  Past Medical History  Diagnosis Date  . Multiple sclerosis   . Unspecified hypothyroidism   . Other voice and resonance disorders   . Other diseases of vocal cords   . Cervical cancer 2005    Family History  family history includes Colon cancer in her father.  Prior Rehab/Hospitalizations:  Came to CIR in 2005   Current Medications  Current facility-administered medications:acetaminophen (TYLENOL) tablet 650 mg, 650 mg, Oral, Q6H PRN, Zannie Cove, MD, 650 mg at 02/25/13 1645;  albuterol (PROVENTIL) (5 MG/ML) 0.5% nebulizer solution 2.5 mg, 2.5 mg, Nebulization, Q2H PRN, Coralyn Helling, MD, 2.5 mg at 03/06/13 0932;  alum & mag hydroxide-simeth (MAALOX/MYLANTA) 200-200-20 MG/5ML suspension 30 mL, 30 mL, Oral, Q4H PRN, Renae Fickle, MD baclofen (LIORESAL) tablet 20-30 mg, 20-30 mg, Oral, QID PRN, Zannie Cove, MD, 20 mg at 03/03/13 2212;  calcium carbonate (TUMS - dosed in mg elemental  calcium) chewable tablet 200 mg of elemental calcium, 1 tablet, Oral, Q6H PRN, Jinger Neighbors, NP, 200 mg of elemental calcium at 02/25/13 2235;  dalfampridine TB12 10 mg, 10 mg, Oral, BID, Lonia Farber, MD, 10 mg at 03/08/13 0933 docusate sodium (COLACE) capsule 100 mg, 100 mg, Oral, BID, Renae Fickle, MD, 100 mg at 03/08/13 0932;  enoxaparin (LOVENOX) injection 40 mg, 40 mg, Subcutaneous, Q24H, Genelle Gather [place], PA-C, 40 mg at 03/07/13 2139;  feeding supplement (RESOURCE BREEZE) (RESOURCE BREEZE) liquid 1 Container, 1 Container, Oral, TID WC, Lorraine Lax, RD, 1 Container at 03/08/13 0800 ferrous sulfate tablet 325 mg, 325 mg, Oral, TID PC, Genelle Gather [place], PA-C, 325 mg at 03/08/13 4098;  fesoterodine (TOVIAZ) tablet 4 mg, 4 mg, Oral, Daily, Zannie Cove, MD, 4 mg at 03/08/13 0933;  FLUoxetine (PROZAC) capsule 40 mg, 40 mg, Oral, Daily, Zannie Cove, MD, 40 mg at 03/08/13 1191;  furosemide (LASIX) injection 20 mg, 20 mg, Intravenous, Daily, Lonia Farber, MD, 20 mg at 03/08/13 4782 gabapentin (NEURONTIN) capsule 600 mg, 600 mg, Oral, BID, Zannie Cove, MD, 600 mg at 03/08/13 0932;  guaiFENesin-codeine 100-10 MG/5ML solution 5 mL, 5 mL, Oral, Q12H PRN, Lupita Leash, MD, 5 mL at 03/06/13 1358;  levothyroxine (SYNTHROID, LEVOTHROID) tablet 75 mcg, 75 mcg, Oral, Daily, Zannie Cove, MD, 75 mcg at 03/08/13 0747 mometasone-formoterol (DULERA) 100-5 MCG/ACT inhaler 2 puff, 2 puff, Inhalation, BID, Lupita Leash, MD, 2 puff at 03/08/13 [phone];  morphine 2 MG/ML injection 1-2 mg, 1-2 mg, Intravenous, Q4H PRN, Lonia Farber, MD, 1 mg at 03/05/13 2344;  pantoprazole (PROTONIX) EC tablet 40 mg, 40 mg, Oral, BID, Lonia Farber, MD, 40 mg at 03/08/13 0932 polyethylene glycol (MIRALAX / GLYCOLAX) packet 17 g, 17 g, Oral, Daily, Renae Fickle, MD, 17 g at 03/08/13 0932;  potassium chloride SA (K-DUR,KLOR-CON) CR tablet 20 mEq, 20 mEq, Oral, Daily,  Lupita Leash, MD, 20 mEq at 03/08/13 0932;  Racepinephrine HCl 2.25 % nebulizer solution 0.5 mL, 0.5 mL, Nebulization, Q2H PRN, Melvenia Beam, MD, 0.5 mL at 03/06/13 0932 risperiDONE (RISPERDAL) tablet 0.25 mg, 0.25 mg, Oral, QHS, Lonia Farber, MD, 0.25 mg at 03/07/13 2141;  senna (SENOKOT) tablet 17.2 mg, 2 tablet, Oral, QHS, Renae Fickle, MD, 17.2 mg at 03/07/13 2140;  sodium chloride 0.9 % injection 10-40 mL, 10-40 mL, Intracatheter, Q12H, Lonia Farber, MD, 10 mL at 03/08/13 0503;  sodium phosphate (FLEET) 7-19 GM/118ML enema 1 enema, 1 enema, Rectal, Once, Simonne Martinet, NP temazepam (RESTORIL) capsule 15 mg, 15 mg, Oral, Daily PRN, Zannie Cove, MD, 15 mg at 03/06/13 2150;  vitamin B-12 (CYANOCOBALAMIN) tablet 1,000 mcg, 1,000 mcg, Oral, Daily, Renae Fickle, MD, 1,000 mcg at 03/08/13 1308  Patients Current Diet: Dysphagia  Precautions / Restrictions Precautions Precautions: Fall;Posterior Hip Precaution Comments: THP sign hung in room. reviewed all hip precautions with pt Restrictions Weight Bearing Restrictions: No  RLE Weight Bearing: Weight bearing as tolerated   Prior Activity Level Community (5-7x/wk): Went out daily and drove herself.  Home Assistive Devices / Equipment Home Assistive Devices/Equipment: Cane (specify quad or straight);Eyeglasses  Prior Functional Level Prior Function Level of Independence: Independent with assistive device(s)  Current Functional Level Cognition  Overall Cognitive Status: Within Functional Limits for tasks assessed Orientation Level: Oriented X4    Extremity Assessment (includes Sensation/Coordination)          ADLs  Eating/Feeding: +1 Total assistance (beverage; daughter assisted) Where Assessed - Eating/Feeding: Edge of bed Upper Body Bathing: Set up Where Assessed - Upper Body Bathing: Supported sitting Toilet Transfer: Performed;+2 Total assistance Toilet Transfer: Patient Percentage:  60% Statistician Method: Surveyor, minerals: Comfort height toilet Toileting - Architect and Hygiene: Performed;+2 Total assistance Toileting - Architect and Hygiene: Patient Percentage: 0% Where Assessed - Glass blower/designer Manipulation and Hygiene: Sit to stand from 3-in-1 or toilet Equipment Used: Long-handled shoe horn;Long-handled sponge;Reacher;Rolling walker;Sock aid Transfers/Ambulation Related to ADLs: sit to stand with A x 2, pt 40%.   ADL Comments: Demonstrated all AE to pt after practice of toilet transfer. Explained coverage. Pt fatigued after toilet transfer so didnt practice hands on with AE but pt verbalized understanding of different uses. Will benefit from practice.     Mobility  Bed Mobility: Not assessed Rolling Left: 1: +2 Total assist (pillow between legs.) Rolling Left: Patient Percentage: 10% Supine to Sit: 1: +2 Total assist Supine to Sit: Patient Percentage: 40% Sitting - Scoot to Edge of Bed: 3: Mod assist Sit to Supine: 1: +2 Total assist Sit to Supine: Patient Percentage: 10%    Transfers  Transfers: Stand Pivot Transfers Sit to Stand: 1: +2 Total assist;With upper extremity assist;From chair/3-in-1;From bed Sit to Stand: Patient Percentage: 50% Stand to Sit: 1: +2 Total assist;To chair/3-in-1;With upper extremity assist Stand to Sit: Patient Percentage: 60% Stand Pivot Transfers: 1: +2 Total assist Stand Pivot Transfers: Patient Percentage: 60%    Ambulation / Gait / Stairs / Wheelchair Mobility  Ambulation/Gait Ambulation/Gait Assistance: 1: +2 Total assist Ambulation/Gait: Patient Percentage: 60% Ambulation Distance (Feet): 15 Feet (X2) Assistive device: Rolling walker Ambulation/Gait Assistance Details: ace Wrap for each ankle to increase dorsiflexion. frequent cues to attempt  keeping R leg in more neutral as R leg tends to internall rotate.  Pt ad more difficulty walking back from BR, decreased sats  and fatigue. Gait Pattern: Step-to pattern;Antalgic;Trunk flexed;Decreased step length - right;Decreased stance time - right Gait velocity: decreased General Gait Details: cues for pursed lip breathing, erect posture.    Posture / Balance Static Sitting Balance Static Sitting - Balance Support: Bilateral upper extremity supported Static Sitting - Level of Assistance: 5: Stand by assistance Static Sitting - Comment/# of Minutes: 10 Static Standing Balance Static Standing - Balance Support: Bilateral upper extremity supported Static Standing - Level of Assistance: 3: Mod assist Static Standing - Comment/# of Minutes: stood for hygiene after toileting x 2 minutes.    Special needs/care consideration BiPAP/CPAP Has CPAP at home, but has not been using it. CPM No Continuous Drip IV No Dialysis No        Life Vest:  No Oxygen Has O2 at 2L [place] Special Bed No Trach Size No Wound Vac (area) No    Skin No  Bowel mgmt: Had last BM 03/05/13 Bladder mgmt: Has a foley catheter fro problems with urinary retention Diabetic mgmt No    Previous Home Environment Living Arrangements: Spouse/significant other Home Care Services: No Additional Comments: pt has 4 ste, 1 level home, tub which she is in the process of converting to shower, and standard commode  Discharge Living Setting Plans for Discharge Living Setting: Patient's home;House;Lives with (comment) (Lives with husband) Type of Home at Discharge: House Discharge Home Layout: Two level;Laundry or work area in basement;Able to live on main level with bedroom/bathroom Emergency planning/management officer is in the basement.) Alternate Level Stairs-Number of Steps: 12 Discharge Home Access: Stairs to enter [employer] of Steps: 3 (Can get a protable ramp if needed.) Does the patient have any problems obtaining your medications?: No  Social/Family/Support Systems Patient Roles: Spouse;Parent (Has a husband and 2  daughters.) Contact Information: Sanae Willetts - husband  [phone] Anticipated Caregiver: Gillian Scarce - sister in law Anticipated Caregiver's Contact Information: Karen Kitchens [phone] Ability/Limitations of Caregiver: Husband works 5 mins away, but can work from home as well.  Dtr from [place] and on winter break.  Has many family and friends to assist. Caregiver Availability: Other (Comment) (Can get close to 24/7 supervision) Discharge Plan Discussed with Primary Caregiver: Yes Is Caregiver In Agreement with Plan?: Yes Does Caregiver/Family have Issues with Lodging/Transportation while Pt is in Rehab?: No  Goals/Additional Needs Patient/Family Goal for Rehab: PT/OT S/Min A goals Expected length of stay: 10-14 days Cultural Considerations: None Dietary Needs: Dys 2, thin liquids Equipment Needs: TBD Pt/Family Agrees to Admission and willing to participate: Yes Program Orientation Provided & Reviewed with Pt/Caregiver Including Roles  & Responsibilities: Yes  Decrease burden of Care through IP rehab admission: N/A  Possible need for SNF placement upon discharge: Not planned  Patient Condition: This patient's medical and functional status has changed since the consult dated: 03/04/13 in which the Rehabilitation Physician determined and documented that the patient's condition is appropriate for intensive rehabilitative care in an inpatient rehabilitation facility. See "History of Present Illness" (above) for medical update. Functional changes are: Currently requiring total assist +2 60% ambulated 15 ft X 2. Patient's medical and functional status update has been discussed with the Rehabilitation physician and patient remains appropriate for inpatient rehabilitation. Will admit to inpatient rehab today.  Preadmission Screen Completed By:  Trish Mage, 03/08/2013 11:24 AM ______________________________________________________________________   Discussed status with Dr. Riley Kill on  03/08/13 at 1135 and received telephone approval for admission today.  Admission Coordinator:  Trish Mage, time1135/Date12/16/14

## 2013-03-08 NOTE — Progress Notes (Signed)
Physical Therapy Treatment Patient Details Name: Kayla [name] MRN: [id number] DOB: May 03, 1950 Today's Date: 03/08/2013 Time: 6213-0865 PT Time Calculation (min): 46 min  PT Assessment / Plan / Recommendation  History of Present Illness Pt. admitted 12/4 after trip and fall, R subcapital hip fx. Began respiratory distress, surgery for hemiarthroplasty performed 03/01/13. Pt has h/o MS.   PT Comments   Pt improving daily. Will go to CIR today.  Follow Up Recommendations  CIR     Does the patient have the potential to tolerate intense rehabilitation     Barriers to Discharge        Equipment Recommendations  Rolling walker with 5" wheels    Recommendations for Other Services    Frequency Min 5X/week   Progress towards PT Goals Progress towards PT goals: Progressing toward goals  Plan Current plan remains appropriate    Precautions / Restrictions Precautions Precaution Comments: THP sign hung in room. reviewed all hip precautions with pt, Monitor O2 sats, needs O2.   Pertinent Vitals/Pain sats 100"% resting 2 l. During mobility ? If probe on forehead registers correctly as sats  60, HR 80's. Probe placed on R ear lobe after therapy which appeared to pick up much better.     Mobility  Transfers Sit to Stand: 1: +2 Total assist;With upper extremity assist;From chair/3-in-1 Sit to Stand: Patient Percentage: 60% Stand to Sit: 1: +2 Total assist;To chair/3-in-1;With upper extremity assist Details for Transfer Assistance:   VC's on proper technique and to adhere to THP.  Increased time and assist for controlled decent to recliner.  cues for posture. Ambulation/Gait Ambulation/Gait Assistance: 1: +2 Total assist Ambulation/Gait: Patient Percentage: 70% Ambulation Distance (Feet): 30 Feet (x2) Assistive device: Rolling walker Ambulation/Gait Assistance Details: ace wrapped Both ankles. Pt does note to have increased srtength in bil. dorsiflexion. Gait Pattern: Step-through  pattern;Decreased stride length General Gait Details: cues for pursed lip breathing, erect posture.R leg tends to internally rotate but noted improved today.    Exercises     PT Diagnosis:    PT Problem List:   PT Treatment Interventions:     PT Goals (current goals can now be found in the care plan section)    Visit Information  Last PT Received On: 03/08/13 Assistance Needed: +2 History of Present Illness: Pt. admitted 12/4 after trip and fall, R subcapital hip fx. Began respiratory distress, surgery for hemiarthroplasty performed 03/01/13. Pt has h/o MS.    Subjective Data      Cognition  Cognition Arousal/Alertness: Awake/alert Behavior During Therapy: WFL for tasks assessed/performed Overall Cognitive Status: Within Functional Limits for tasks assessed    Balance     End of Session PT - End of Session Activity Tolerance: Patient tolerated treatment well Patient left: in chair;with call bell/phone within reach;with family/visitor present Nurse Communication: Mobility status   GP     Rada Hay 03/08/2013, 11:54 AM Blanchard Kelch PT [phone]

## 2013-03-08 NOTE — H&P (View-Only) (Signed)
Physical Medicine and Rehabilitation Admission H&P    Chief Complaint  Patient presents with  . Left femur fracture  . Recent MS exacerbation.  : HPI: Kayla [name] is a 62 y.o. 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 Surgical intervention recommended by Dr. Charlann Boxer but on 02/24/13 she developed dyspnea and hypoxia with intermittent stridor and was transferred to ICU. Dr. Emeline Darling consulted for input and laryngoscopy with stable bilateral true vocal fold paresis, narrow~57mm glottic airway anteriorly----ok for intubation. She developed fever on 12/05 due to pseudomonas UTI as well as suspicion of aspiration PNA and was treated broad antibiotic coverage. Neurology consulted for input on worsening of weakness as family reported increased fatigue and weakness lower and upper extremities prior to admission with recommendation to start steroid dose pack. She was treated with IV solumedrol X three days per Dr. Delight Ovens input. Has completed 10 day course of Levaquin today--procalcitonin levels <0.10.   Once stabilized, she underwent right hip hemi by Dr. Charlann Boxer on 03/01/13 and placed on lovenox for DVT prophylaxis. She was extubated without difficulty and MBS done post op to evaluate swallow function. MBS done revealed moderate pharyngeal and cervical esophageal dysphagia and patient placed on D2, thin liquids. She has had issues with pain and lethargy but is showing some improvement in participation with therapy. Continues to have increased oxygen needs with activity. CXR with left base atelectasis rather than consolidation and pulmonary toilet recommended by CCM. As mentation improving, diet advanced to D3, thin with compensatory strategies. Urinary retention treated with  foley  Therapies ongoing and CIR recommended for progression.    Review of Systems  Constitutional: Negative for fever and chills.  HENT: Negative for congestion.   Eyes: Negative for double vision.  Respiratory: Positive for shortness of breath. Negative for cough.   Cardiovascular: Negative for chest pain and palpitations.  Gastrointestinal: Negative for heartburn, nausea, vomiting and abdominal pain.  Genitourinary: Negative for dysuria.  Musculoskeletal: Positive for joint pain.  Skin: Negative for rash.  Neurological: Negative for dizziness, tingling, sensory change, focal weakness and headaches.  Psychiatric/Behavioral: Positive for suicidal ideas. Negative for depression.   Past Medical History  Diagnosis Date  . Multiple sclerosis   . Unspecified hypothyroidism   . Other voice and resonance disorders   . Other diseases of vocal cords   . Cervical cancer 2005   Past Surgical History  Procedure Laterality Date  . Vesicovaginal fistula closure w/ tah    . Hip arthroplasty Right 03/01/2013    Procedure: ARTHROPLASTY BIPOLAR HIP;  Surgeon: Shelda Pal, MD;  Location: [facility]S;  Service: Orthopedics;  Laterality: Right;   Family History  Problem Relation Age of Onset  . Colon cancer Father    Social History:  reports that she has never smoked. She has never used smokeless tobacco. She reports that she drinks alcohol. She reports that she does not use illicit drugs. Allergies:  Allergies  Allergen Reactions  . Methylprednisolone Other (See Comments)    Pt states cannot take any steroids b/c they cause a decrease in her heart rate.  . Oxycodone-Acetaminophen     REACTION: unspecified  . Oxycodone-Acetaminophen     Rash head to toe  . Percocet [Oxycodone-Acetaminophen]     hallucinations  . Phenytoin     REACTION:  rash  . Zanaflex [Tizanidine Hydrochloride]   . Phenytoin Sodium Extended Rash   Medications Prior to Admission  Medication Sig Dispense Refill  .  AMPYRA 10 MG TB12 Take 1 tablet by mouth 2 (two) times daily.      . baclofen (LIORESAL) 20 MG tablet Take 20 mg by mouth. 1 1/2 tabs four times a day       . dexlansoprazole (DEXILANT) 60 MG capsule Take 60 mg by mouth daily before breakfast.      . fesoterodine (TOVIAZ) 4 MG TB24 tablet Take 4 mg by mouth daily.      Marland Kitchen FLUoxetine (PROZAC) 40 MG capsule Take 40 mg by mouth daily.        Marland Kitchen gabapentin (NEURONTIN) 600 MG tablet Take 600 mg by mouth 2 (two) times daily.      Marland Kitchen levothyroxine (SYNTHROID) 75 MCG tablet Take 1 tablet (75 mcg total) by mouth daily.  30 tablet  11  . naproxen (NAPROSYN) 500 MG tablet Take 500 mg by mouth 2 (two) times daily with a meal.        . PRESCRIPTION MEDICATION 1 each every 30 (thirty) days. tysabri infusion taken at [facility] on 02/16/2013      . temazepam (RESTORIL) 15 MG capsule Take 15 mg by mouth daily as needed.          Home: Home Living Family/patient expects to be discharged to:: Unsure Living Arrangements: Spouse/significant other Additional Comments: pt has 4 ste, 1 level home, tub which she is in the process of converting to shower, and standard commode   Functional History:    Functional Status:  Mobility: Bed Mobility Bed Mobility: Not assessed Rolling Left: 1: +2 Total assist (pillow between legs.) Rolling Left: Patient Percentage: 10% Supine to Sit: 1: +2 Total assist Supine to Sit: Patient Percentage: 40% Sitting - Scoot to Edge of Bed: 3: Mod assist Sit to Supine: 1: +2 Total assist Sit to Supine: Patient Percentage: 10% Transfers Transfers: Stand Pivot Transfers Sit to Stand: 1: +2 Total assist;With upper extremity assist;From chair/3-in-1;From bed Sit to Stand: Patient Percentage: 50% Stand to Sit: 1: +2 Total assist;To chair/3-in-1;With upper extremity assist Stand to Sit: Patient Percentage: 60% Stand Pivot Transfers: 1: +2 Total assist Stand Pivot Transfers: Patient Percentage:  60% Ambulation/Gait Ambulation/Gait Assistance: 1: +2 Total assist Ambulation/Gait: Patient Percentage: 60% Ambulation Distance (Feet): 15 Feet (X2) Assistive device: Rolling walker Ambulation/Gait Assistance Details: ace Wrap for each ankle to increase dorsiflexion. frequent cues to attempt  keeping R leg in more neutral as R leg tends to internall rotate.  Pt ad more difficulty walking back from BR, decreased sats and fatigue. Gait Pattern: Step-to pattern;Antalgic;Trunk flexed;Decreased step length - right;Decreased stance time - right Gait velocity: decreased General Gait Details: cues for pursed lip breathing, erect posture.    ADL: ADL Eating/Feeding: +1 Total assistance (beverage; daughter assisted) Where Assessed - Eating/Feeding: Edge of bed Upper Body Bathing: Set up Where Assessed - Upper Body Bathing: Supported sitting Toilet Transfer: Performed;+2 Total assistance Toilet Transfer Method: Surveyor, minerals: Comfort height toilet Equipment Used: Long-handled shoe horn;Long-handled sponge;Reacher;Rolling walker;Sock aid Transfers/Ambulation Related to ADLs: sit to stand with A x 2, pt 40%.   ADL Comments: Demonstrated all AE to pt after practice of toilet transfer. Explained coverage. Pt fatigued after toilet transfer so didnt practice hands on with AE but pt verbalized understanding of different uses. Will benefit from practice.   Cognition: Cognition Overall Cognitive Status:  Within Functional Limits for tasks assessed Orientation Level: Oriented X4 Cognition Arousal/Alertness: Awake/alert Behavior During Therapy: WFL for tasks assessed/performed Overall Cognitive Status: Within Functional Limits for tasks assessed  Physical Exam: Blood pressure 103/68, pulse 82, temperature 98.7 F (37.1 C), temperature source Oral, resp. rate 20, height 5\' 3"  (1.6 m), weight 138 lb 0.1 oz (62.6 kg), SpO2 96.00%. Physical Exam  Constitutional: She is oriented to  person, place, and time. She appears well-developed and well-nourished.  Wearing pulse ox on scalp  HENT:  Head: Normocephalic and atraumatic.  Right Ear: External ear normal.  Left Ear: External ear normal.  Eyes: Conjunctivae and EOM are normal. Pupils are equal, round, and reactive to light.  Neck: Normal range of motion. Neck supple.  Cardiovascular: Normal rate, regular rhythm and normal heart sounds.   Respiratory: Effort normal. No respiratory distress.  Purse lip breathes when pulse-oximeter alarm goes off.  GI: She exhibits no distension and no mass. There is no tenderness. There is no rebound and no guarding.  Musculoskeletal:  Left hip dressed. Limb appropriately tender.   Neurological: She is alert and oriented to person, place, and time. She has normal reflexes. She displays normal reflexes. No cranial nerve deficit. She exhibits abnormal muscle tone. Coordination normal.  Upper ext 5/5 bilaterally. Right lower 4- HF to 4 KE, to 4- ankle. LLE is 1+ HF, 2 KE, 3/5 ADF, 3+ APF. No gross sensory deficits were appreciated.     Results for orders placed during the hospital encounter of 02/23/13 (from the past 48 hour(s))  PROCALCITONIN     Status: None   Collection Time    03/07/13  5:15 AM      Result Value Range   Procalcitonin <0.10     Comment:            Interpretation:     PCT (Procalcitonin) <= 0.5 ng/mL:     Systemic infection (sepsis) is not likely.     Local bacterial infection is possible.     (NOTE)             ICU PCT Algorithm               Non ICU PCT Algorithm        ----------------------------     ------------------------------             PCT < 0.25 ng/mL                 PCT < 0.1 ng/mL         Stopping of antibiotics            Stopping of antibiotics           strongly encouraged.               strongly encouraged.        ----------------------------     ------------------------------           PCT level decrease by               PCT < 0.25 ng/mL            >= 80% from peak PCT           OR PCT 0.25 - 0.5 ng/mL          Stopping of antibiotics  encouraged.         Stopping of antibiotics               encouraged.        ----------------------------     ------------------------------           PCT level decrease by              PCT >= 0.25 ng/mL           < 80% from peak PCT            AND PCT >= 0.5 ng/mL            Continuing antibiotics                                                  encouraged.           Continuing antibiotics                encouraged.        ----------------------------     ------------------------------         PCT level increase compared          PCT > 0.5 ng/mL             with peak PCT AND              PCT >= 0.5 ng/mL             Escalation of antibiotics                                              strongly encouraged.          Escalation of antibiotics            strongly encouraged.  BASIC METABOLIC PANEL     Status: Abnormal   Collection Time    03/07/13  5:17 AM      Result Value Range   Sodium 132 (*) 135 - 145 mEq/L   Potassium 4.3  3.5 - 5.1 mEq/L   Chloride 95 (*) 96 - 112 mEq/L   CO2 26  19 - 32 mEq/L   Glucose, Bld 130 (*) 70 - 99 mg/dL   BUN 13  6 - 23 mg/dL   Creatinine, Ser 4.03  0.50 - 1.10 mg/dL   Calcium 8.4  8.4 - 47.4 mg/dL   GFR calc non Af Amer >90  >90 mL/min   GFR calc Af Amer >90  >90 mL/min   Comment: (NOTE)     The eGFR has been calculated using the CKD EPI equation.     This calculation has not been validated in all clinical situations.     eGFR's persistently <90 mL/min signify possible Chronic Kidney     Disease.  CBC     Status: Abnormal   Collection Time    03/07/13  5:17 AM      Result Value Range   WBC 18.2 (*) 4.0 - 10.5 K/uL   RBC 2.81 (*) 3.87 - 5.11 MIL/uL   Hemoglobin 8.1 (*) 12.0 - 15.0 g/dL   HCT 25.9 (*) 56.3 - 87.5 %   MCV 85.4  78.0 - 100.0 fL   MCH 28.8  26.0 - 34.0 pg   MCHC 33.8  30.0 - 36.0 g/dL    RDW 95.6  21.3 - 08.6 %   Platelets 275  150 - 400 K/uL  BASIC METABOLIC PANEL     Status: Abnormal   Collection Time    03/08/13  5:00 AM      Result Value Range   Sodium 132 (*) 135 - 145 mEq/L   Potassium 3.7  3.5 - 5.1 mEq/L   Chloride 97  96 - 112 mEq/L   CO2 27  19 - 32 mEq/L   Glucose, Bld 96  70 - 99 mg/dL   BUN 11  6 - 23 mg/dL   Creatinine, Ser 5.78  0.50 - 1.10 mg/dL   Calcium 8.2 (*) 8.4 - 10.5 mg/dL   GFR calc non Af Amer >90  >90 mL/min   GFR calc Af Amer >90  >90 mL/min   Comment: (NOTE)     The eGFR has been calculated using the CKD EPI equation.     This calculation has not been validated in all clinical situations.     eGFR's persistently <90 mL/min signify possible Chronic Kidney     Disease.  CBC     Status: Abnormal   Collection Time    03/08/13  5:00 AM      Result Value Range   WBC 14.3 (*) 4.0 - 10.5 K/uL   RBC 2.56 (*) 3.87 - 5.11 MIL/uL   Hemoglobin 7.4 (*) 12.0 - 15.0 g/dL   HCT 46.9 (*) 62.9 - 52.8 %   MCV 85.5  78.0 - 100.0 fL   MCH 28.9  26.0 - 34.0 pg   MCHC 33.8  30.0 - 36.0 g/dL   RDW 41.3  24.4 - 01.0 %   Platelets 283  150 - 400 K/uL  PROCALCITONIN     Status: None   Collection Time    03/08/13  5:00 AM      Result Value Range   Procalcitonin <0.10     Comment:            Interpretation:     PCT (Procalcitonin) <= 0.5 ng/mL:     Systemic infection (sepsis) is not likely.     Local bacterial infection is possible.     (NOTE)             ICU PCT Algorithm               Non ICU PCT Algorithm        ----------------------------     ------------------------------             PCT < 0.25 ng/mL                 PCT < 0.1 ng/mL         Stopping of antibiotics            Stopping of antibiotics           strongly encouraged.               strongly encouraged.        ----------------------------     ------------------------------           PCT level decrease by               PCT < 0.25 ng/mL           >= 80% from peak PCT           OR  PCT  0.25 - 0.5 ng/mL          Stopping of antibiotics                                                 encouraged.         Stopping of antibiotics               encouraged.        ----------------------------     ------------------------------           PCT level decrease by              PCT >= 0.25 ng/mL           < 80% from peak PCT            AND PCT >= 0.5 ng/mL            Continuing antibiotics                                                  encouraged.           Continuing antibiotics                encouraged.        ----------------------------     ------------------------------         PCT level increase compared          PCT > 0.5 ng/mL             with peak PCT AND              PCT >= 0.5 ng/mL             Escalation of antibiotics                                              strongly encouraged.          Escalation of antibiotics            strongly encouraged.   Dg Chest 2 View  03/06/2013   CLINICAL DATA:  Shortness of breath and cough  EXAM: CHEST  2 VIEW  COMPARISON:  03/02/2013  FINDINGS: Cardiac shadow is within normal limits. The lungs are well aerated bilaterally although increasing left basilar infiltrate is noted. The low right lung remains clear. A right-sided PICC line is again seen and stable.  IMPRESSION: Significant increase in left lower lobe infiltrate when compared with the prior exam.   Electronically Signed   By: Alcide Clever M.D.   On: 03/06/2013 14:25   Dg Chest Port 1 View  03/07/2013   CLINICAL DATA:  Follow-up left-sided airspace disease.  EXAM: PORTABLE CHEST - 1 VIEW  COMPARISON:  03/06/2013  FINDINGS: Parenchymal changes left lung have improved slightly since the prior examination. This may reflect result of shifting of pleural fluid inferiorly.  Left perihilar infectious infiltrate cannot be excluded in proper clinical setting.  Asymmetric pulmonary edema not excluded although typically greater on the right.  No gross pneumothorax.  PICC line tip mid  superior vena  cava level.  IMPRESSION: Change in appearance of left thorax may reflect pleural fluid layering inferiorly. What remains may represent asymmetric pulmonary edema or left perihilar infectious infiltrate.   Electronically Signed   By: Bridgett Larsson M.D.   On: 03/07/2013 14:54    Post Admission Physician Evaluation: 1. Functional deficits secondary  to right femoral neck fracture in a patient with MS . 2. Patient is admitted to receive collaborative, interdisciplinary care between the physiatrist, rehab nursing staff, and therapy team. 3. Patient's level of medical complexity and substantial therapy needs in context of that medical necessity cannot be provided at a lesser intensity of care such as a SNF. 4. Patient has experienced substantial functional loss from his/her baseline which was documented above under the "Functional History" and "Functional Status" headings.  Judging by the patient's diagnosis, physical exam, and functional history, the patient has potential for functional progress which will result in measurable gains while on inpatient rehab.  These gains will be of substantial and practical use upon discharge  in facilitating mobility and self-care at the household level. 5. Physiatrist will provide 24 hour management of medical needs as well as oversight of the therapy plan/treatment and provide guidance as appropriate regarding the interaction of the two. 6. 24 hour rehab nursing will assist with bladder management, bowel management, safety, skin/wound care, disease management, medication administration, pain management and patient education  and help integrate therapy concepts, techniques,education, etc. 7. PT will assess and treat for/with: Lower extremity strength, range of motion, stamina, balance, functional mobility, safety, adaptive techniques and equipment, ortho precautions, pain mgt NMR, .   Goals are: supervision to min assist. 8. OT will assess and treat for/with:  ADL's, functional mobility, safety, upper extremity strength, adaptive techniques and equipment, NMR, pain mgt, ortho precautions.   Goals are: supervision to min assist. 9. SLP will assess and treat for/with: swallowing.  Goals are: mod I. 10. Case Management and Social Worker will assess and treat for psychological issues and discharge planning. 11. Team conference will be held weekly to assess progress toward goals and to determine barriers to discharge. 12. Patient will receive at least 3 hours of therapy per day at least 5 days per week. 13. ELOS: 15-20 days       14. Prognosis:  excellent   Medical Problem List and Plan: Left femur fracture complicated by aspiration PNA, Pseudomona UTI and recent MS exacerbation.   1. DVT Prophylaxis/Anticoagulation: Pharmaceutical: Lovenox 2. Pain Management:  Has excessive sedation with narcotics.   -Tylenol prn. Ice post therapy and prn.  -She is complaining of little pain at this point and seemed to handle this morning's acitivites well. 3. Mood: pt is motivated and ready to work. Team to provide ego support as appropriate.  4. Neuropsych: This patient is capable of making decisions on her own behalf. 5. MS with neurogenic bladder/ Pseudomonas UTI with urinary retention:  - Stop Toviaz.   -reCheck UA/UCS.   -Monitor voiding with PVR checks.  6. Aspiration PNA: Has completed antibiotic regimen.   -Encourage IS with flutter valve.  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   -continued oxygen via [place]---need to make sure oxymetry readings are accurate 8. Leukocytosis: Reactive due to PNA--resolving.  9. ABLA:  Is being monitored and at 7.4-8.1 range. Has increased oxygen needs as well as fatigue with activity.  - Recheck in am. If symptomatic with increase in activity may need transfusion.  10  Chronic hyponatremia:  Follow up labs  in am.   Ranelle Oyster, MD, [facility] Physical Medicine & Rehabilitation    03/08/2013

## 2013-03-08 NOTE — Progress Notes (Signed)
   Subjective: 7 Days Post-Op Procedure(s) (LRB): ARTHROPLASTY BIPOLAR HIP (Right)   Patient reports pain as moderate, pain controlled. No events throughout the night. Feels that she is best that she has been since being in the hospital. Do to urinary retention a foley cath was placed.  She would really like to try again with out the foley.  Objective:   VITALS:   Filed Vitals:   03/08/13 0535  BP: 103/68  Pulse: 82  Temp: 98.7 F (37.1 C)  Resp: 20    Neurovascular intact Dorsiflexion/Plantar flexion intact Incision: dressing C/[name]/I No cellulitis present Compartment soft  LABS  Recent Labs  03/06/13 0521 03/07/13 0517 03/08/13 0500  HGB 7.4* 8.1* 7.4*  HCT 21.9* 24.0* 21.9*  WBC 15.6* 18.2* 14.3*  PLT 239 275 283     Recent Labs  03/06/13 0521 03/07/13 0517 03/08/13 0500  NA 135 132* 132*  K 4.0 4.3 3.7  BUN 16 13 11   CREATININE 0.52 0.53 0.60  GLUCOSE 108* 130* 96     Assessment/Plan: 7 Days Post-Op Procedure(s) (LRB): ARTHROPLASTY BIPOLAR HIP (Right) Up with therapy Discharge to SNF/CIR when medically ready Ortho suggestion would be for:  ASA 325 mg bid for 4 weeks, change if alternate is medically neccessary.  Norco , Rx written, again change if needed  Dressing to be maintained for 2 weeks total, may be taken off at follow up in the clinic Follow up in 2 weeks at [facility].  Follow up with OLIN,Kayla [name] in 2 weeks.   Contact information:   [facility], [place]  [address], [address]  [place] [address]  [phone]       Kayla [name]. Kayla [name]   PAC  03/08/2013, 8:05 AM

## 2013-03-08 NOTE — H&P (Signed)
Physical Medicine and Rehabilitation Admission H&P    Chief Complaint  Patient presents with  . Left femur fracture  . Recent MS exacerbation.  : HPI: Kayla [name] is a 62 y.o. 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 Surgical intervention recommended by Dr. Olin but on 02/24/13 she developed dyspnea and hypoxia with intermittent stridor and was transferred to ICU. Dr. Gore consulted for input and laryngoscopy with stable bilateral true vocal fold paresis, narrow~2mm glottic airway anteriorly----ok for intubation. She developed fever on 12/05 due to pseudomonas UTI as well as suspicion of aspiration PNA and was treated broad antibiotic coverage. Neurology consulted for input on worsening of weakness as family reported increased fatigue and weakness lower and upper extremities prior to admission with recommendation to start steroid dose pack. She was treated with IV solumedrol X three days per Dr. Camilo's input. Has completed 10 day course of Levaquin today--procalcitonin levels <0.10.   Once stabilized, she underwent right hip hemi by Dr. Olin on 03/01/13 and placed on lovenox for DVT prophylaxis. She was extubated without difficulty and MBS done post op to evaluate swallow function. MBS done revealed moderate pharyngeal and cervical esophageal dysphagia and patient placed on D2, thin liquids. She has had issues with pain and lethargy but is showing some improvement in participation with therapy. Continues to have increased oxygen needs with activity. CXR with left base atelectasis rather than consolidation and pulmonary toilet recommended by CCM. As mentation improving, diet advanced to D3, thin with compensatory strategies. Urinary retention treated with  foley  Therapies ongoing and CIR recommended for progression.    Review of Systems  Constitutional: Negative for fever and chills.  HENT: Negative for congestion.   Eyes: Negative for double vision.  Respiratory: Positive for shortness of breath. Negative for cough.   Cardiovascular: Negative for chest pain and palpitations.  Gastrointestinal: Negative for heartburn, nausea, vomiting and abdominal pain.  Genitourinary: Negative for dysuria.  Musculoskeletal: Positive for joint pain.  Skin: Negative for rash.  Neurological: Negative for dizziness, tingling, sensory change, focal weakness and headaches.  Psychiatric/Behavioral: Positive for suicidal ideas. Negative for depression.   Past Medical History  Diagnosis Date  . Multiple sclerosis   . Unspecified hypothyroidism   . Other voice and resonance disorders   . Other diseases of vocal cords   . Cervical cancer 2005   Past Surgical History  Procedure Laterality Date  . Vesicovaginal fistula closure w/ tah    . Hip arthroplasty Right 03/01/2013    Procedure: ARTHROPLASTY BIPOLAR HIP;  Surgeon: Matthew D Olin, MD;  Location: [facility];  Service: Orthopedics;  Laterality: Right;   Family History  Problem Relation Age of Onset  . Colon cancer Father    Social History:  reports that she has never smoked. She has never used smokeless tobacco. She reports that she drinks alcohol. She reports that she does not use illicit drugs. Allergies:  Allergies  Allergen Reactions  . Methylprednisolone Other (See Comments)    Pt states cannot take any steroids b/c they cause a decrease in her heart rate.  . Oxycodone-Acetaminophen     REACTION: unspecified  . Oxycodone-Acetaminophen     Rash head to toe  . Percocet [Oxycodone-Acetaminophen]     hallucinations  . Phenytoin     REACTION:   rash  . Zanaflex [Tizanidine Hydrochloride]   . Phenytoin Sodium Extended Rash   Medications Prior to Admission  Medication Sig Dispense Refill  .  AMPYRA 10 MG TB12 Take 1 tablet by mouth 2 (two) times daily.      . baclofen (LIORESAL) 20 MG tablet Take 20 mg by mouth. 1 1/2 tabs four times a day       . dexlansoprazole (DEXILANT) 60 MG capsule Take 60 mg by mouth daily before breakfast.      . fesoterodine (TOVIAZ) 4 MG TB24 tablet Take 4 mg by mouth daily.      . FLUoxetine (PROZAC) 40 MG capsule Take 40 mg by mouth daily.        . gabapentin (NEURONTIN) 600 MG tablet Take 600 mg by mouth 2 (two) times daily.      . levothyroxine (SYNTHROID) 75 MCG tablet Take 1 tablet (75 mcg total) by mouth daily.  30 tablet  11  . naproxen (NAPROSYN) 500 MG tablet Take 500 mg by mouth 2 (two) times daily with a meal.        . PRESCRIPTION MEDICATION 1 each every 30 (thirty) days. tysabri infusion taken at [facility] on 02/16/2013      . temazepam (RESTORIL) 15 MG capsule Take 15 mg by mouth daily as needed.          Home: Home Living Family/patient expects to be discharged to:: Unsure Living Arrangements: Spouse/significant other Additional Comments: pt has 4 ste, 1 level home, tub which she is in the process of converting to shower, and standard commode   Functional History:    Functional Status:  Mobility: Bed Mobility Bed Mobility: Not assessed Rolling Left: 1: +2 Total assist (pillow between legs.) Rolling Left: Patient Percentage: 10% Supine to Sit: 1: +2 Total assist Supine to Sit: Patient Percentage: 40% Sitting - Scoot to Edge of Bed: 3: Mod assist Sit to Supine: 1: +2 Total assist Sit to Supine: Patient Percentage: 10% Transfers Transfers: Stand Pivot Transfers Sit to Stand: 1: +2 Total assist;With upper extremity assist;From chair/3-in-1;From bed Sit to Stand: Patient Percentage: 50% Stand to Sit: 1: +2 Total assist;To chair/3-in-1;With upper extremity assist Stand to Sit: Patient Percentage: 60% Stand Pivot Transfers: 1: +2 Total assist Stand Pivot Transfers: Patient Percentage:  60% Ambulation/Gait Ambulation/Gait Assistance: 1: +2 Total assist Ambulation/Gait: Patient Percentage: 60% Ambulation Distance (Feet): 15 Feet (X2) Assistive device: Rolling walker Ambulation/Gait Assistance Details: ace Wrap for each ankle to increase dorsiflexion. frequent cues to attempt  keeping R leg in more neutral as R leg tends to internall rotate.  Pt ad more difficulty walking back from BR, decreased sats and fatigue. Gait Pattern: Step-to pattern;Antalgic;Trunk flexed;Decreased step length - right;Decreased stance time - right Gait velocity: decreased General Gait Details: cues for pursed lip breathing, erect posture.    ADL: ADL Eating/Feeding: +1 Total assistance (beverage; daughter assisted) Where Assessed - Eating/Feeding: Edge of bed Upper Body Bathing: Set up Where Assessed - Upper Body Bathing: Supported sitting Toilet Transfer: Performed;+2 Total assistance Toilet Transfer Method: Stand pivot Toilet Transfer Equipment: Comfort height toilet Equipment Used: Long-handled shoe horn;Long-handled sponge;Reacher;Rolling walker;Sock aid Transfers/Ambulation Related to ADLs: sit to stand with A x 2, pt 40%.   ADL Comments: Demonstrated all AE to pt after practice of toilet transfer. Explained coverage. Pt fatigued after toilet transfer so didnt practice hands on with AE but pt verbalized understanding of different uses. Will benefit from practice.   Cognition: Cognition Overall Cognitive Status:   Within Functional Limits for tasks assessed Orientation Level: Oriented X4 Cognition Arousal/Alertness: Awake/alert Behavior During Therapy: WFL for tasks assessed/performed Overall Cognitive Status: Within Functional Limits for tasks assessed  Physical Exam: Blood pressure 103/68, pulse 82, temperature 98.7 F (37.1 C), temperature source Oral, resp. rate 20, height 5' 3" (1.6 m), weight 138 lb 0.1 oz (62.6 kg), SpO2 96.00%. Physical Exam  Constitutional: She is oriented to  person, place, and time. She appears well-developed and well-nourished.  Wearing pulse ox on scalp  HENT:  Head: Normocephalic and atraumatic.  Right Ear: External ear normal.  Left Ear: External ear normal.  Eyes: Conjunctivae and EOM are normal. Pupils are equal, round, and reactive to light.  Neck: Normal range of motion. Neck supple.  Cardiovascular: Normal rate, regular rhythm and normal heart sounds.   Respiratory: Effort normal. No respiratory distress.  Purse lip breathes when pulse-oximeter alarm goes off.  GI: She exhibits no distension and no mass. There is no tenderness. There is no rebound and no guarding.  Musculoskeletal:  Left hip dressed. Limb appropriately tender.   Neurological: She is alert and oriented to person, place, and time. She has normal reflexes. She displays normal reflexes. No cranial nerve deficit. She exhibits abnormal muscle tone. Coordination normal.  Upper ext 5/5 bilaterally. Right lower 4- HF to 4 KE, to 4- ankle. LLE is 1+ HF, 2 KE, 3/5 ADF, 3+ APF. No gross sensory deficits were appreciated.     Results for orders placed during the hospital encounter of 02/23/13 (from the past 48 hour(s))  PROCALCITONIN     Status: None   Collection Time    03/07/13  5:15 AM      Result Value Range   Procalcitonin <0.10     Comment:            Interpretation:     PCT (Procalcitonin) <= 0.5 ng/mL:     Systemic infection (sepsis) is not likely.     Local bacterial infection is possible.     (NOTE)             ICU PCT Algorithm               Non ICU PCT Algorithm        ----------------------------     ------------------------------             PCT < 0.25 ng/mL                 PCT < 0.1 ng/mL         Stopping of antibiotics            Stopping of antibiotics           strongly encouraged.               strongly encouraged.        ----------------------------     ------------------------------           PCT level decrease by               PCT < 0.25 ng/mL            >= 80% from peak PCT           OR PCT 0.25 - 0.5 ng/mL          Stopping of antibiotics                                                   encouraged.         Stopping of antibiotics               encouraged.        ----------------------------     ------------------------------           PCT level decrease by              PCT >= 0.25 ng/mL           < 80% from peak PCT            AND PCT >= 0.5 ng/mL            Continuing antibiotics                                                  encouraged.           Continuing antibiotics                encouraged.        ----------------------------     ------------------------------         PCT level increase compared          PCT > 0.5 ng/mL             with peak PCT AND              PCT >= 0.5 ng/mL             Escalation of antibiotics                                              strongly encouraged.          Escalation of antibiotics            strongly encouraged.  BASIC METABOLIC PANEL     Status: Abnormal   Collection Time    03/07/13  5:17 AM      Result Value Range   Sodium 132 (*) 135 - 145 mEq/L   Potassium 4.3  3.5 - 5.1 mEq/L   Chloride 95 (*) 96 - 112 mEq/L   CO2 26  19 - 32 mEq/L   Glucose, Bld 130 (*) 70 - 99 mg/dL   BUN 13  6 - 23 mg/dL   Creatinine, Ser 0.53  0.50 - 1.10 mg/dL   Calcium 8.4  8.4 - 10.5 mg/dL   GFR calc non Af Amer >90  >90 mL/min   GFR calc Af Amer >90  >90 mL/min   Comment: (NOTE)     The eGFR has been calculated using the CKD EPI equation.     This calculation has not been validated in all clinical situations.     eGFR's persistently <90 mL/min signify possible Chronic Kidney     Disease.  CBC     Status: Abnormal   Collection Time    03/07/13  5:17 AM      Result Value Range   WBC 18.2 (*) 4.0 - 10.5 K/uL   RBC 2.81 (*) 3.87 - 5.11 MIL/uL   Hemoglobin 8.1 (*) 12.0 - 15.0 g/dL   HCT 24.0 (*) 36.0 - 46.0 %   MCV 85.4  78.0 - 100.0 fL   MCH 28.8    26.0 - 34.0 pg   MCHC 33.8  30.0 - 36.0 g/dL    RDW 14.4  11.5 - 15.5 %   Platelets 275  150 - 400 K/uL  BASIC METABOLIC PANEL     Status: Abnormal   Collection Time    03/08/13  5:00 AM      Result Value Range   Sodium 132 (*) 135 - 145 mEq/L   Potassium 3.7  3.5 - 5.1 mEq/L   Chloride 97  96 - 112 mEq/L   CO2 27  19 - 32 mEq/L   Glucose, Bld 96  70 - 99 mg/dL   BUN 11  6 - 23 mg/dL   Creatinine, Ser 0.60  0.50 - 1.10 mg/dL   Calcium 8.2 (*) 8.4 - 10.5 mg/dL   GFR calc non Af Amer >90  >90 mL/min   GFR calc Af Amer >90  >90 mL/min   Comment: (NOTE)     The eGFR has been calculated using the CKD EPI equation.     This calculation has not been validated in all clinical situations.     eGFR's persistently <90 mL/min signify possible Chronic Kidney     Disease.  CBC     Status: Abnormal   Collection Time    03/08/13  5:00 AM      Result Value Range   WBC 14.3 (*) 4.0 - 10.5 K/uL   RBC 2.56 (*) 3.87 - 5.11 MIL/uL   Hemoglobin 7.4 (*) 12.0 - 15.0 g/dL   HCT 21.9 (*) 36.0 - 46.0 %   MCV 85.5  78.0 - 100.0 fL   MCH 28.9  26.0 - 34.0 pg   MCHC 33.8  30.0 - 36.0 g/dL   RDW 14.6  11.5 - 15.5 %   Platelets 283  150 - 400 K/uL  PROCALCITONIN     Status: None   Collection Time    03/08/13  5:00 AM      Result Value Range   Procalcitonin <0.10     Comment:            Interpretation:     PCT (Procalcitonin) <= 0.5 ng/mL:     Systemic infection (sepsis) is not likely.     Local bacterial infection is possible.     (NOTE)             ICU PCT Algorithm               Non ICU PCT Algorithm        ----------------------------     ------------------------------             PCT < 0.25 ng/mL                 PCT < 0.1 ng/mL         Stopping of antibiotics            Stopping of antibiotics           strongly encouraged.               strongly encouraged.        ----------------------------     ------------------------------           PCT level decrease by               PCT < 0.25 ng/mL           >= 80% from peak PCT           OR   PCT  0.25 - 0.5 ng/mL          Stopping of antibiotics                                                 encouraged.         Stopping of antibiotics               encouraged.        ----------------------------     ------------------------------           PCT level decrease by              PCT >= 0.25 ng/mL           < 80% from peak PCT            AND PCT >= 0.5 ng/mL            Continuing antibiotics                                                  encouraged.           Continuing antibiotics                encouraged.        ----------------------------     ------------------------------         PCT level increase compared          PCT > 0.5 ng/mL             with peak PCT AND              PCT >= 0.5 ng/mL             Escalation of antibiotics                                              strongly encouraged.          Escalation of antibiotics            strongly encouraged.   Dg Chest 2 View  03/06/2013   CLINICAL DATA:  Shortness of breath and cough  EXAM: CHEST  2 VIEW  COMPARISON:  03/02/2013  FINDINGS: Cardiac shadow is within normal limits. The lungs are well aerated bilaterally although increasing left basilar infiltrate is noted. The low right lung remains clear. A right-sided PICC line is again seen and stable.  IMPRESSION: Significant increase in left lower lobe infiltrate when compared with the prior exam.   Electronically Signed   By: Mark  Lukens M.D.   On: 03/06/2013 14:25   Dg Chest Port 1 View  03/07/2013   CLINICAL DATA:  Follow-up left-sided airspace disease.  EXAM: PORTABLE CHEST - 1 VIEW  COMPARISON:  03/06/2013  FINDINGS: Parenchymal changes left lung have improved slightly since the prior examination. This may reflect result of shifting of pleural fluid inferiorly.  Left perihilar infectious infiltrate cannot be excluded in proper clinical setting.  Asymmetric pulmonary edema not excluded although typically greater on the right.  No gross pneumothorax.  PICC line tip mid  superior vena   cava level.  IMPRESSION: Change in appearance of left thorax may reflect pleural fluid layering inferiorly. What remains may represent asymmetric pulmonary edema or left perihilar infectious infiltrate.   Electronically Signed   By: Steve  Olson M.D.   On: 03/07/2013 14:54    Post Admission Physician Evaluation: 1. Functional deficits secondary  to right femoral neck fracture in a patient with MS . 2. Patient is admitted to receive collaborative, interdisciplinary care between the physiatrist, rehab nursing staff, and therapy team. 3. Patient's level of medical complexity and substantial therapy needs in context of that medical necessity cannot be provided at a lesser intensity of care such as a SNF. 4. Patient has experienced substantial functional loss from his/her baseline which was documented above under the "Functional History" and "Functional Status" headings.  Judging by the patient's diagnosis, physical exam, and functional history, the patient has potential for functional progress which will result in measurable gains while on inpatient rehab.  These gains will be of substantial and practical use upon discharge  in facilitating mobility and self-care at the household level. 5. Physiatrist will provide 24 hour management of medical needs as well as oversight of the therapy plan/treatment and provide guidance as appropriate regarding the interaction of the two. 6. 24 hour rehab nursing will assist with bladder management, bowel management, safety, skin/wound care, disease management, medication administration, pain management and patient education  and help integrate therapy concepts, techniques,education, etc. 7. PT will assess and treat for/with: Lower extremity strength, range of motion, stamina, balance, functional mobility, safety, adaptive techniques and equipment, ortho precautions, pain mgt NMR, .   Goals are: supervision to min assist. 8. OT will assess and treat for/with:  ADL's, functional mobility, safety, upper extremity strength, adaptive techniques and equipment, NMR, pain mgt, ortho precautions.   Goals are: supervision to min assist. 9. SLP will assess and treat for/with: swallowing.  Goals are: mod I. 10. Case Management and Social Worker will assess and treat for psychological issues and discharge planning. 11. Team conference will be held weekly to assess progress toward goals and to determine barriers to discharge. 12. Patient will receive at least 3 hours of therapy per day at least 5 days per week. 13. ELOS: 15-20 days       14. Prognosis:  excellent   Medical Problem List and Plan: Left femur fracture complicated by aspiration PNA, Pseudomona UTI and recent MS exacerbation.   1. DVT Prophylaxis/Anticoagulation: Pharmaceutical: Lovenox 2. Pain Management:  Has excessive sedation with narcotics.   -Tylenol prn. Ice post therapy and prn.  -She is complaining of little pain at this point and seemed to handle this morning's acitivites well. 3. Mood: pt is motivated and ready to work. Team to provide ego support as appropriate.  4. Neuropsych: This patient is capable of making decisions on her own behalf. 5. MS with neurogenic bladder/ Pseudomonas UTI with urinary retention:  - Stop Toviaz.   -reCheck UA/UCS.   -Monitor voiding with PVR checks.  6. Aspiration PNA: Has completed antibiotic regimen.   -Encourage IS with flutter valve.  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   -continued oxygen via [place]---need to make sure oxymetry readings are accurate 8. Leukocytosis: Reactive due to PNA--resolving.  9. ABLA:  Is being monitored and at 7.4-8.1 range. Has increased oxygen needs as well as fatigue with activity.  - Recheck in am. If symptomatic with increase in activity may need transfusion.  10  Chronic hyponatremia:  Follow up labs   in am.   Trentan Trippe T. Simone Tuckey, MD, FAAPMR [facility]    03/08/2013 

## 2013-03-08 NOTE — Progress Notes (Signed)
Pt was admitted to 4W24. Admission vitals are stable. families are with the patient and they are watching TV.

## 2013-03-08 NOTE — Plan of Care (Signed)
Problem: Phase III Progression Outcomes Goal: Pain controlled on oral analgesia Outcome: Progressing Pt having no pain. Goal: Activity at appropriate level-compared to baseline (UP IN CHAIR FOR HEMODIALYSIS)  Outcome: Progressing Pt ambulated with PT.  Goal: Ambulate BID with assist as able Outcome: Progressing Pt ambulating with assistance.  Goal: Discharge plan remains appropriate-arrangements made Outcome: Progressing D/C arrangements made.

## 2013-03-09 ENCOUNTER — Inpatient Hospital Stay (HOSPITAL_COMMUNITY): Payer: 59

## 2013-03-09 ENCOUNTER — Inpatient Hospital Stay (HOSPITAL_COMMUNITY): Payer: 59 | Admitting: Physical Therapy

## 2013-03-09 ENCOUNTER — Encounter: Payer: Self-pay | Admitting: Internal Medicine

## 2013-03-09 DIAGNOSIS — R1314 Dysphagia, pharyngoesophageal phase: Secondary | ICD-10-CM | POA: Insufficient documentation

## 2013-03-09 DIAGNOSIS — S72009A Fracture of unspecified part of neck of unspecified femur, initial encounter for closed fracture: Secondary | ICD-10-CM

## 2013-03-09 DIAGNOSIS — G35 Multiple sclerosis: Secondary | ICD-10-CM

## 2013-03-09 DIAGNOSIS — R1310 Dysphagia, unspecified: Secondary | ICD-10-CM | POA: Insufficient documentation

## 2013-03-09 DIAGNOSIS — D62 Acute posthemorrhagic anemia: Secondary | ICD-10-CM

## 2013-03-09 DIAGNOSIS — R339 Retention of urine, unspecified: Secondary | ICD-10-CM | POA: Diagnosis present

## 2013-03-09 DIAGNOSIS — D72829 Elevated white blood cell count, unspecified: Secondary | ICD-10-CM | POA: Diagnosis present

## 2013-03-09 LAB — URINALYSIS, ROUTINE W REFLEX MICROSCOPIC
Bilirubin Urine: NEGATIVE
Glucose, UA: NEGATIVE mg/dL
Hgb urine dipstick: NEGATIVE
Ketones, ur: NEGATIVE mg/dL
Leukocytes, UA: NEGATIVE
Nitrite: NEGATIVE
Protein, ur: NEGATIVE mg/dL
Specific Gravity, Urine: 1.02 (ref 1.005–1.030)
Urobilinogen, UA: 0.2 mg/dL (ref 0.0–1.0)
pH: 6.5 (ref 5.0–8.0)

## 2013-03-09 LAB — COMPREHENSIVE METABOLIC PANEL
ALT: 13 U/L (ref 0–35)
AST: 22 U/L (ref 0–37)
Albumin: 2.3 g/dL — ABNORMAL LOW (ref 3.5–5.2)
Alkaline Phosphatase: 55 U/L (ref 39–117)
BUN: 12 mg/dL (ref 6–23)
CO2: 27 mEq/L (ref 19–32)
Calcium: 8.1 mg/dL — ABNORMAL LOW (ref 8.4–10.5)
Chloride: 98 mEq/L (ref 96–112)
Creatinine, Ser: 0.6 mg/dL (ref 0.50–1.10)
GFR calc Af Amer: >90 mL/min (ref >90)
GFR calc non Af Amer: >90 mL/min (ref >90)
Glucose, Bld: 101 mg/dL — ABNORMAL HIGH (ref 70–99)
Potassium: 3.9 mEq/L (ref 3.5–5.1)
Sodium: 133 mEq/L — ABNORMAL LOW (ref 135–145)
Total Bilirubin: 0.4 mg/dL (ref 0.3–1.2)
Total Protein: 5 g/dL — ABNORMAL LOW (ref 6.0–8.3)

## 2013-03-09 LAB — CBC WITH DIFFERENTIAL/PLATELET
Basophils Absolute: 0.2 10*3/uL — ABNORMAL HIGH (ref 0.0–0.1)
Basophils Relative: 1 % (ref 0–1)
Eosinophils Absolute: 0.5 10*3/uL (ref 0.0–0.7)
Eosinophils Relative: 3 % (ref 0–5)
HCT: 22.4 % — ABNORMAL LOW (ref 36.0–46.0)
Hemoglobin: 7.6 g/dL — ABNORMAL LOW (ref 12.0–15.0)
Lymphocytes Relative: 11 % — ABNORMAL LOW (ref 12–46)
Lymphs Abs: 1.7 10*3/uL (ref 0.7–4.0)
MCH: 29 pg (ref 26.0–34.0)
MCHC: 33.9 g/dL (ref 30.0–36.0)
MCV: 85.5 fL (ref 78.0–100.0)
Monocytes Absolute: 1.4 10*3/uL — ABNORMAL HIGH (ref 0.1–1.0)
Monocytes Relative: 9 % (ref 3–12)
Neutro Abs: 11.9 10*3/uL — ABNORMAL HIGH (ref 1.7–7.7)
Neutrophils Relative %: 76 % (ref 43–77)
Platelets: 295 10*3/uL (ref 150–400)
RBC: 2.62 MIL/uL — ABNORMAL LOW (ref 3.87–5.11)
RDW: 14.7 % (ref 11.5–15.5)
WBC: 15.7 10*3/uL — ABNORMAL HIGH (ref 4.0–10.5)

## 2013-03-09 LAB — PROCALCITONIN: Procalcitonin: <0.1 ng/mL

## 2013-03-09 LAB — PREPARE RBC (CROSSMATCH)

## 2013-03-09 LAB — ABO/RH: ABO/RH(D): O POS

## 2013-03-09 MED ORDER — ACETAMINOPHEN 325 MG PO TABS
650.0000 mg | ORAL_TABLET | Freq: Once | ORAL | Status: AC
  Administered 2013-03-09: 650 mg via ORAL

## 2013-03-09 MED ORDER — DIPHENHYDRAMINE HCL 25 MG PO CAPS: 25.0000 mg | ORAL_CAPSULE | Freq: Once | ORAL | Status: DC

## 2013-03-09 MED ORDER — BENEPROTEIN PO POWD
1.0000 | Freq: Three times a day (TID) | ORAL | Status: DC
  Administered 2013-03-09 – 2013-03-12 (×9): 6 g via ORAL
  Filled 2013-03-09: qty 227

## 2013-03-09 MED ORDER — FUROSEMIDE 20 MG PO TABS
20.0000 mg | ORAL_TABLET | Freq: Every day | ORAL | Status: DC
  Administered 2013-03-10 – 2013-03-12 (×3): 20 mg via ORAL
  Filled 2013-03-09 (×6): qty 1

## 2013-03-09 MED ORDER — FUROSEMIDE 10 MG/ML IJ SOLN
20.0000 mg | Freq: Once | INTRAMUSCULAR | Status: AC
  Administered 2013-03-10: 20 mg via INTRAVENOUS
  Filled 2013-03-09 (×2): qty 2

## 2013-03-09 NOTE — Care Management Note (Signed)
[facility] Individual Statement of Services  Patient Name:  Kayla [name]  Date:  03/09/2013  Welcome to the [facility].  Our goal is to provide you with an individualized program based on your diagnosis and situation, designed to meet your specific needs.  With this comprehensive rehabilitation program, you will be expected to participate in at least 3 hours of rehabilitation therapies Monday-Friday, with modified therapy programming on the weekends.  Your rehabilitation program will include the following services:  Physical Therapy (PT), Occupational Therapy (OT), 24 hour per day rehabilitation nursing, Therapeutic Recreaction (TR), Case Management (Social Worker), Rehabilitation Medicine, Nutrition Services and Pharmacy Services  Weekly team conferences will be held on Wednesday to discuss your progress.  Your Social Worker will talk with you frequently to get your input and to update you on team discussions.  Team conferences with you and your family in attendance may also be held.  Expected length of stay: 7-10 days  Overall anticipated outcome: supervision/mod/i level  Depending on your progress and recovery, your program may change. Your Social Worker will coordinate services and will keep you informed of any changes. Your Social Worker's name and contact numbers are listed  below.  The following services may also be recommended but are not provided by the [facility]:   Driving Evaluations  Home Health Rehabiltiation Services  Outpatient Rehabilitation Services   Arrangements will be made to provide these services after discharge if needed.  Arrangements include referral to agencies that provide these services.  Your insurance has been verified to be:  [facility] Your primary doctor is:  Dr. Sandrea Hughs  Pertinent information will be shared with your doctor and your insurance company.  Social Worker:  Dossie Der, SW  [phone] or [phone]  Information discussed with and copy given to patient by: Lucy Chris, 03/09/2013, 2:23 PM

## 2013-03-09 NOTE — Progress Notes (Signed)
Social Work Assessment and Plan Social Work Assessment and Plan  Patient Details  Name: Kayla [name] MRN: [id number] Date of Birth: 06/02/1950  Today's Date: 03/09/2013  Problem List:  Patient Active Problem List   Diagnosis Date Noted  . Urinary retention with incomplete bladder emptying 03/09/2013  . Leucocytosis 03/09/2013  . Femoral neck fracture 03/08/2013  . CAP (community acquired pneumonia) 02/26/2013  . Unspecified constipation 02/26/2013  . HCAP (healthcare-associated pneumonia) 02/26/2013  . Acute diastolic heart failure 02/26/2013  . Vocal cord paralysis 02/26/2013  . Acute respiratory failure with hypoxia 02/24/2013  . Pain 02/24/2013  . Fractured femoral neck 02/23/2013  . Hip fracture 02/23/2013  . Excessive flatus 10/21/2012  . Cold feet 07/29/2012  . Hyponatremia 05/20/2012  . Calf pain 11/17/2011  . Back pain 11/27/2010  . DYSPNEA 12/06/2007  . OSTEOPENIA 09/17/2007  . HYPOTHYROIDISM 04/01/2007  . Multiple sclerosis 04/01/2007  . Other diseases of vocal cords 04/01/2007   Past Medical History:  Past Medical History  Diagnosis Date  . Multiple sclerosis   . Unspecified hypothyroidism   . Other voice and resonance disorders   . Other diseases of vocal cords   . Cervical cancer 2005   Past Surgical History:  Past Surgical History  Procedure Laterality Date  . Vesicovaginal fistula closure w/ tah    . Hip arthroplasty Right 03/01/2013    Procedure: ARTHROPLASTY BIPOLAR HIP;  Surgeon: Shelda Pal, MD;  Location: [facility];  Service: Orthopedics;  Laterality: Right;   Social History:  reports that she has never smoked. She has never used smokeless tobacco. She reports that she drinks alcohol. She reports that she does not use illicit drugs.  Family / Support Systems Marital Status: Married Patient Roles: Spouse;Parent Spouse/Significant Other: Homero Fellers [phone]  [phone] Children: Daughter on break from [place] here with pt Other Supports:  Bobbi Hardin-sister in-law  [phone] Anticipated Caregiver: Different family members Ability/Limitations of Caregiver: Husband works, daughter here for short time and sister in-law can do intermittent assist Caregiver Availability: Other (Comment) (Coming up with a plan) Family Dynamics: Close knit family who are supportive and involved and willing to help pt.  Pt wants to regain her independence so she does not burden her family members.  Social History Preferred language: English Religion: Presbyterian Cultural Background: No issues Education: [employer] Read: Yes Write: Yes Employment Status: Disabled Fish farm manager Issues: No issues Guardian/Conservator: None-according to MD pt is capable of making her own decisions while here.   Abuse/Neglect Physical Abuse: Denies Verbal Abuse: Denies Sexual Abuse: Denies Exploitation of patient/patient's resources: Denies Self-Neglect: Denies  Emotional Status Pt's affect, behavior adn adjustment status: Pt is motivated to do well and has in the past.  Her MS is affecting her progress due to her fracture.  She is having much pain and blood pressure issues today.  MD is delaing with them and also her low hemoglobin.  She is pleased to be here and feels everyone is puling for her. Recent Psychosocial Issues: Other health issues-MS and UTI, along with pneumonia Pyschiatric History: No history-deferred depression screen due to pt tired from therapies and feels she is doing well coping.  Her daughter is in the room and reports her Mom is alwyas upbeat and rolls with the punches.  Will monitor while here. Substance Abuse History: No issues  Patient / Family Perceptions, Expectations & Goals Pt/Family understanding of illness & functional limitations: Pt and daughter have a good understanding of her diagnoses and her current treamtent  plan.  Pt wished she felt better than she could push herself more than she is.  She reports: " It is  hard feeling the way I do." Premorbid pt/family roles/activities: Wife, Mother, Retiree, HOme owner, Chruch member, etc Anticipated changes in roles/activities/participation: resume Pt/family expectations/goals: Pt states: " I want to be able to do for myself."  Daughter states: " Mom is a Merchandiser, retail, she doesn;t let things stop her."  [employer]: None Premorbid Home Care/DME Agencies: Other (Comment) (Had in the past) Transportation available at discharge: Family Resource referrals recommended: Support group (specify) (MS Support group)  Discharge Planning Living Arrangements: Spouse/significant other Support Systems: Spouse/significant other;Children;Other relatives;Friends/neighbors;Church/faith community Type of Residence: Private residence Insurance Resources: Media planner (specify) Education officer, museum) Financial Resources: Family Support Financial Screen Referred: No Living Expenses: Lives with family Money Management: Spouse Does the patient have any problems obtaining your medications?: No Home Management: Family members Patient/Family Preliminary Plans: Return home with family taking turns,being with her.  Daughter is currently on break from school and is home and able to assist.  Sister in-law is willing to help while husband works during the day. Social Work Anticipated Follow Up Needs: HH/OP;Support Group  Clinical Impression Pleasant willing to work through her medical issues to regain her independence.  Supportive family who are willing to assist her.  Short length of stay due to pt ,should do well here.  Lucy Chris 03/09/2013, 2:22 PM

## 2013-03-09 NOTE — Plan of Care (Signed)
Problem: RH BLADDER ELIMINATION Goal: RH STG MANAGE BLADDER WITH ASSISTANCE STG Manage Bladder With mod Assistance  Outcome: Progressing In and out cath performed for no void

## 2013-03-09 NOTE — Progress Notes (Signed)
Blood pressure still low despite holding lasix. Dropped to 83 with activity with prolonged time to recover. Patient agreeable to transfusion and will write orders. Is due for Tysarbi on 03/16/13 at [facility]. Not available at [facility] but is done  At [facility] need to be arranged for tx there. Will check with SW.

## 2013-03-09 NOTE — Progress Notes (Signed)
Subjective/Complaints: 62 y.o. 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 Surgical intervention recommended by Dr. Charlann Boxer but on 02/24/13 she developed dyspnea and hypoxia with intermittent stridor and was transferred to ICU. Dr. Emeline Darling consulted for input and laryngoscopy with stable bilateral true vocal fold paresis, narrow~26mm glottic airway anteriorly----ok for intubation. She developed fever on 12/05 due to pseudomonas UTI as well as suspicion of aspiration PNA and was treated broad antibiotic coverage. Neurology consulted for input on worsening of weakness as family reported increased fatigue and weakness lower and upper extremities prior to admission with recommendation to start steroid dose pack. She was treated with IV solumedrol X three days per Dr. Delight Ovens input. Has completed 10 day course of Levaquin today--procalcitonin levels <0.10.  Once stabilized, she underwent right hip hemi by Dr. Charlann Boxer on 03/01/13 and placed on lovenox for DVT prophylaxis. She was extubated without difficulty and MBS done post op to evaluate swallow function. MBS done revealed moderate pharyngeal and cervical esophageal dysphagia and patient placed on D2, thin liquids   Objective: Vital Signs: Blood pressure 92/58, pulse 82, temperature 100.8 F (38.2 C), temperature source Oral, resp. rate 19, height 5' 2.99" (1.6 m), weight 62.6 kg (138 lb 0.1 oz), SpO2 96.00%. Dg Chest Port 1 View  03/07/2013   CLINICAL DATA:  Follow-up left-sided airspace disease.  EXAM: PORTABLE CHEST - 1 VIEW  COMPARISON:  03/06/2013  FINDINGS: Parenchymal changes left lung have improved slightly since the prior examination. This may reflect result of shifting of pleural fluid inferiorly.  Left perihilar  infectious infiltrate cannot be excluded in proper clinical setting.  Asymmetric pulmonary edema not excluded although typically greater on the right.  No gross pneumothorax.  PICC line tip mid superior vena cava level.  IMPRESSION: Change in appearance of left thorax may reflect pleural fluid layering inferiorly. What remains may represent asymmetric pulmonary edema or left perihilar infectious infiltrate.   Electronically Signed   By: Bridgett Larsson M.D.   On: 03/07/2013 14:54   Results for orders placed during the hospital encounter of 03/08/13 (from the past 72 hour(s))  PROCALCITONIN     Status: None   Collection Time    03/09/13  4:50 AM      Result Value Range   Procalcitonin <0.10     Comment:            Interpretation:     PCT (Procalcitonin) <= 0.5 ng/mL:     Systemic infection (sepsis) is not likely.     Local bacterial infection is possible.     (NOTE)             ICU PCT Algorithm               Non ICU PCT Algorithm        ----------------------------     ------------------------------             PCT < 0.25 ng/mL                 PCT < 0.1 ng/mL         Stopping of antibiotics            Stopping of antibiotics           strongly encouraged.  strongly encouraged.        ----------------------------     ------------------------------           PCT level decrease by               PCT < 0.25 ng/mL           >= 80% from peak PCT           OR PCT 0.25 - 0.5 ng/mL          Stopping of antibiotics                                                 encouraged.         Stopping of antibiotics               encouraged.        ----------------------------     ------------------------------           PCT level decrease by              PCT >= 0.25 ng/mL           < 80% from peak PCT            AND PCT >= 0.5 ng/mL            Continuing antibiotics                                                  encouraged.           Continuing antibiotics                encouraged.         ----------------------------     ------------------------------         PCT level increase compared          PCT > 0.5 ng/mL             with peak PCT AND              PCT >= 0.5 ng/mL             Escalation of antibiotics                                              strongly encouraged.          Escalation of antibiotics            strongly encouraged.  CBC WITH DIFFERENTIAL     Status: Abnormal   Collection Time    03/09/13  4:50 AM      Result Value Range   WBC 15.7 (*) 4.0 - 10.5 K/uL   RBC 2.62 (*) 3.87 - 5.11 MIL/uL   Hemoglobin 7.6 (*) 12.0 - 15.0 g/dL   HCT 29.5 (*) 62.1 - 30.8 %   MCV 85.5  78.0 - 100.0 fL   MCH 29.0  26.0 - 34.0 pg   MCHC 33.9  30.0 - 36.0 g/dL   RDW 65.7  84.6 - 96.2 %   Platelets 295  150 - 400 K/uL  Neutrophils Relative % 76  43 - 77 %   Lymphocytes Relative 11 (*) 12 - 46 %   Monocytes Relative 9  3 - 12 %   Eosinophils Relative 3  0 - 5 %   Basophils Relative 1  0 - 1 %   Neutro Abs 11.9 (*) 1.7 - 7.7 K/uL   Lymphs Abs 1.7  0.7 - 4.0 K/uL   Monocytes Absolute 1.4 (*) 0.1 - 1.0 K/uL   Eosinophils Absolute 0.5  0.0 - 0.7 K/uL   Basophils Absolute 0.2 (*) 0.0 - 0.1 K/uL   RBC Morphology RARE NRBCs     Comment: POLYCHROMASIA PRESENT     SCHISTOCYTES PRESENT (2-5/hpf)   WBC Morphology       Value: MODERATE LEFT SHIFT (>5% METAS AND MYELOS,OCC PRO NOTED)   Comment: ATYPICAL LYMPHOCYTES     TOXIC GRANULATION   Smear Review LARGE PLATELETS PRESENT    COMPREHENSIVE METABOLIC PANEL     Status: Abnormal   Collection Time    03/09/13  4:50 AM      Result Value Range   Sodium 133 (*) 135 - 145 mEq/L   Potassium 3.9  3.5 - 5.1 mEq/L   Chloride 98  96 - 112 mEq/L   CO2 27  19 - 32 mEq/L   Glucose, Bld 101 (*) 70 - 99 mg/dL   BUN 12  6 - 23 mg/dL   Creatinine, Ser 2.13  0.50 - 1.10 mg/dL   Calcium 8.1 (*) 8.4 - 10.5 mg/dL   Total Protein 5.0 (*) 6.0 - 8.3 g/dL   Albumin 2.3 (*) 3.5 - 5.2 g/dL   AST 22  0 - 37 U/L   ALT 13  0 - 35 U/L   Alkaline  Phosphatase 55  39 - 117 U/L   Total Bilirubin 0.4  0.3 - 1.2 mg/dL   GFR calc non Af Amer >90  >90 mL/min   GFR calc Af Amer >90  >90 mL/min   Comment: (NOTE)     The eGFR has been calculated using the CKD EPI equation.     This calculation has not been validated in all clinical situations.     eGFR's persistently <90 mL/min signify possible Chronic Kidney     Disease.  URINALYSIS, ROUTINE W REFLEX MICROSCOPIC     Status: None   Collection Time    03/09/13  4:53 AM      Result Value Range   Color, Urine YELLOW  YELLOW   APPearance CLEAR  CLEAR   Specific Gravity, Urine 1.020  1.005 - 1.030   pH 6.5  5.0 - 8.0   Glucose, UA NEGATIVE  NEGATIVE mg/dL   Hgb urine dipstick NEGATIVE  NEGATIVE   Bilirubin Urine NEGATIVE  NEGATIVE   Ketones, ur NEGATIVE  NEGATIVE mg/dL   Protein, ur NEGATIVE  NEGATIVE mg/dL   Urobilinogen, UA 0.2  0.0 - 1.0 mg/dL   Nitrite NEGATIVE  NEGATIVE   Leukocytes, UA NEGATIVE  NEGATIVE   Comment: MICROSCOPIC NOT DONE ON URINES WITH NEGATIVE PROTEIN, BLOOD, LEUKOCYTES, NITRITE, OR GLUCOSE <1000 mg/dL.     HEENT: O2 [place] Cardio: tachy and no murmurs Resp: Wheezes and mildly labored GI: BS positive and non distended Extremity:  Pulses positive and No Edema Skin:   Intact Neuro: Alert/Oriented, Cranial Nerve II-XII normal, Normal Sensory and Abnormal Motor 4/5 R DELT , BI,TRI,GRIP, 3-/5 B HF,  L KE AND B ADF, 2-R KE Musc/Skel:  Other HIP ROM not tested  Gen NAD   Assessment/Plan: 1. Functional deficits secondary to R FNF, PNA, MS which require 3+ hours per day of interdisciplinary therapy in a comprehensive inpatient rehab setting. Physiatrist is providing close team supervision and 24 hour management of active medical problems listed below. Physiatrist and rehab team continue to assess barriers to discharge/monitor patient progress toward functional and medical goals. FIM:                   Comprehension Comprehension Mode: Auditory Comprehension:  5-Understands complex 90% of the time/Cues < 10% of the time  Expression Expression Mode: Verbal Expression: 5-Expresses basic needs/ideas: With no assist  Social Interaction Social Interaction: 5-Interacts appropriately 90% of the time - Needs monitoring or encouragement for participation or interaction.  Problem Solving Problem Solving: 5-Solves basic problems: With no assist  Memory Memory: 5-Recognizes or recalls 90% of the time/requires cueing < 10% of the time  Medical Problem List and Plan:  Left femur fracture complicated by aspiration PNA, Pseudomona UTI and recent MS exacerbation.  1. DVT Prophylaxis/Anticoagulation: Pharmaceutical: Lovenox  2. Pain Management: Has excessive sedation with narcotics.  -Tylenol prn. Ice post therapy and prn.  -She is complaining of little pain at this point and seemed to handle this morning's acitivites well.  3. Mood: pt is motivated and ready to work. Team to provide ego support as appropriate.  4. Neuropsych: This patient is capable of making decisions on her own behalf.  5. MS with neurogenic bladder/ Pseudomonas UTI with urinary retention:  - Stop Toviaz. Pro calcitonin level<.1 -Neg  UA.  -Monitor voiding with PVR checks.  6. Aspiration PNA: Has completed antibiotic regimen. Still wheezing ?BOOP, will cont inhalers, unable to tolerate steroids -Encourage IS with flutter valve.  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  -continued oxygen via [place]---need to make sure oxymetry readings are accurate  8. Leukocytosis: Reactive due to PNA--resolving.  9. ABLA: Is being monitored and at 7.4-8.1 range. Has increased oxygen needs as well as fatigue with activity.  - Recheck in am. If symptomatic with increase in activity may need transfusion.  10 Chronic hyponatremia: 133 borderline low will monitor   LOS (Days) 1 A FACE TO FACE EVALUATION WAS PERFORMED  KIRSTEINS,ANDREW E 03/09/2013, 9:02 AM

## 2013-03-09 NOTE — IPOC Note (Signed)
Overall Plan of Care [facility]) Patient Details Name: Kayla [name] MRN: [id number] DOB: 16-Feb-1951  Admitting Diagnosis: RT FEM FX  Hospital Problems: Principal Problem:   Femoral neck fracture Active Problems:   Multiple sclerosis   Acute respiratory failure with hypoxia   Urinary retention with incomplete bladder emptying   Leucocytosis     Functional Problem List: Nursing Bladder;Safety;Bowel;Skin Integrity;Pain  PT    OT Balance;Endurance;Motor;Safety  SLP    TR         Basic ADL's: OT Grooming;Bathing;Dressing;Toileting     Advanced  ADL's: OT Simple Meal Preparation;Laundry     Transfers: PT    OT Toilet;Tub/Shower     Locomotion: PT       Additional Impairments: OT Other (comment) (MS impacting activity tolerance)  SLP        TR      Anticipated Outcomes Item Anticipated Outcome  Self Feeding    Swallowing      Basic self-care  Mod I   Toileting  Mod I   Bathroom Transfers Mod I  Bowel/Bladder  cont of bowel and bladder  Transfers     Locomotion     Communication     Cognition     Pain  les than 3  Safety/Judgment  adhere to safety precaution   Therapy Plan:   OT Intensity: Minimum of 1-2 x/day, 45 to 90 minutes OT Frequency: 5 out of 7 days OT Duration/Estimated Length of Stay: 10-14 days         Team Interventions: Nursing Interventions Patient/Family Education;Bladder Management;Medication Management;Pain Management;Bowel Management;Disease Management/Prevention  PT interventions    OT Interventions Balance/vestibular training;Community reintegration;Discharge planning;DME/adaptive equipment instruction;Pain management;Patient/family education;Self Care/advanced ADL retraining;Therapeutic Activities;Therapeutic Exercise;UE/LE Strength taining/ROM;UE/LE Coordination activities  SLP Interventions    TR Interventions    SW/CM Interventions      Team Discharge Planning: Destination: PT-  ,OT- Home , SLP-  Projected  Follow-up: PT- , OT-  Home health OT, SLP-  Projected Equipment Needs: PT- , OT- Tub/shower bench, SLP-  Equipment Details: PT- , OT-  Patient/family involved in discharge planning: PT-  ,  OT-Patient;Family member/caregiver, SLP-   MD ELOS: 10-12d Medical Rehab Prognosis:  Good Assessment: 62 y.o. 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 Surgical intervention recommended by Dr. Charlann Boxer but on 02/24/13 she developed dyspnea and hypoxia with intermittent stridor and was transferred to ICU. Dr. Emeline Darling consulted for input and laryngoscopy with stable bilateral true vocal fold paresis, narrow~81mm glottic airway anteriorly----ok for intubation. She developed fever on 12/05 due to pseudomonas UTI as well as suspicion of aspiration PNA and was treated broad antibiotic coverage. Neurology consulted for input on worsening of weakness as family reported increased fatigue and weakness lower and upper extremities prior to admission with recommendation to start steroid dose pack  Now requiring 24/7 Rehab RN,MD, as well as CIR level PT, OT and SLP.  Treatment team will focus on ADLs and mobility with goals set at ModI/Sup   See Team Conference Notes for weekly updates to the plan of care

## 2013-03-09 NOTE — Progress Notes (Signed)
Social Work Patient ID: Kayla [name], female   DOB: 1951/03/09, 62 y.o.   MRN: [id number] Met with pt and daughter to inform of team conference goals-supervision/mod/i level goals and discharge 12/30. She hopes once she feels better, she will do better and maybe quicker.  She has voiding issues, blood pressure and low hemoglobin issues. MD is addressing.  Daughter is here to observe in therapies today.  Agreeable to discharge date.

## 2013-03-09 NOTE — Anesthesia Postprocedure Evaluation (Signed)
Anesthesia Post Note  Patient: Kayla [name]  Procedure(s) Performed: Procedure(s) (LRB): ARTHROPLASTY BIPOLAR HIP (Right)  Anesthesia type: General  Patient location: PACU  Post pain: Pain level controlled  Post assessment: Post-op Vital signs reviewed  Last Vitals: BP 104/64  Pulse 91  Temp(Src) 36.6 C (Oral)  Resp 20  Ht 5\' 3"  (1.6 m)  Wt 138 lb 0.1 oz (62.6 kg)  BMI 24.45 kg/m2  SpO2 98%  Post vital signs: Reviewed  Level of consciousness: sedated  Complications: No apparent anesthesia complications

## 2013-03-09 NOTE — Progress Notes (Signed)
Delle Reining, PA order for orthostatic BP this am.  Patient's BP 97/61 lying and 107/61 for standing. Patient symptomatic with therapy and activity, but denies of any symptoms when resting.  Delle Reining, PA aware of low BP, ordered to hold lasix morning dose.  Patient is on 2L oxygen via [place] with O2 sat >93%. Will continue to monitor patient.

## 2013-03-09 NOTE — Patient Care Conference (Signed)
Inpatient RehabilitationTeam Conference and Plan of Care Update Date: 03/09/2013   Time: 11;25 AM    Patient Name: Kayla [name]      Medical Record Number: [id number]  Date of Birth: 09-02-1950 Sex: Female         Room/Bed: 4W24C/4W24C-01 Payor Info: Payor: Advertising copywriter / Plan: Advertising copywriter / Product Type: *No Product type* /    Admitting Diagnosis: RT FEM FX  Admit Date/Time:  03/08/2013  4:55 PM Admission Comments: No comment available   Primary Diagnosis:  Femoral neck fracture Principal Problem: Femoral neck fracture  Patient Active Problem List   Diagnosis Date Noted  . Urinary retention with incomplete bladder emptying 03/09/2013  . Leucocytosis 03/09/2013  . Dysphagia, unspecified(787.20) 03/09/2013  . Femoral neck fracture 03/08/2013  . CAP (community acquired pneumonia) 02/26/2013  . Unspecified constipation 02/26/2013  . HCAP (healthcare-associated pneumonia) 02/26/2013  . Acute diastolic heart failure 02/26/2013  . Vocal cord paralysis 02/26/2013  . Acute respiratory failure with hypoxia 02/24/2013  . Pain 02/24/2013  . Fractured femoral neck 02/23/2013  . Hip fracture 02/23/2013  . Excessive flatus 10/21/2012  . Cold feet 07/29/2012  . Hyponatremia 05/20/2012  . Calf pain 11/17/2011  . Back pain 11/27/2010  . DYSPNEA 12/06/2007  . OSTEOPENIA 09/17/2007  . HYPOTHYROIDISM 04/01/2007  . Multiple sclerosis 04/01/2007  . Other diseases of vocal cords 04/01/2007    Expected Discharge Date: Expected Discharge Date: 03/22/13  Team Members Present: Physician leading conference: Dr. Claudette Laws Social Worker Present: Dossie Der, LCSW Nurse Present: Leland Johns, RN PT Present: Edman Circle, PT OT Present: Rosalio Loud, Heath Lark, OT SLP Present: Fae Pippin, SLP PPS Coordinator present : Edson Snowball, Chapman Fitch, RN, CRRN     Current Status/Progress Goal Weekly Team Focus  Medical   Femur fracture, multiple sclerosis with chronic  lower extremity weakness, chronic neurogenic bladder  Maximal functional improvement with discharge to home  Improve activity tolerance, transfuse, adjust medications   Bowel/Bladder   Bladder scanned at 2059 for 289, cath for 350; Scanned at 0430 for 249, cath for 400  To void on her own      Swallow/Nutrition/ Hydration     [facility]        ADL's   mod assist transfers, total assist LB dressing (w/o AE), total assist toilet task, min assist UB dressing and grooming  mod I  activity tolerance, functional transfers, energy conservation, standing balance, use of AE, carryover of precautions   Mobility   mod A with bed mobility and transfers, min A with w/c propulsion; gait not yet performed  mod I with bed mobility, transfers, and w/c propulsion; min A with gait  activity tolerance, transfer/bed mobility training, initiation of gait training   Communication     [facility]        Safety/Cognition/ Behavioral Observations    NO safety issues        Pain   Hasn't c/o pain  Pain managed at or below 3  Assess pain Q shift and reassess after intervention   Skin   Skin intact  Assess skin Q shift; no breakdown, educate family on skin/wound care         *See Care Plan and progress notes for long and short-term goals.  Barriers to Discharge: Neurologic as well as orthopedic issues    Possible Resolutions to Barriers:  Continue multidisciplinary rehabilitation    Discharge Planning/Teaching Needs:    Home with husband-daughter home on break from school, to help in  the short term.  Sister in-law willing to help also Hopefully once medical issues improved and pt feeling better then will do well.     Team Discussion:  Blood pressure, low hemoglobin and voiding issues-working on.  Fatigue, but pushing through her issues to participate in therapies.  Revisions to Treatment Plan:  New eval   Continued Need for Acute Rehabilitation Level of Care: The patient requires daily medical management by a  physician with specialized training in physical medicine and rehabilitation for the following conditions: Daily direction of a multidisciplinary physical rehabilitation program to ensure safe treatment while eliciting the highest outcome that is of practical value to the patient.: Yes Daily analysis of laboratory values and/or radiology reports with any subsequent need for medication adjustment of medical intervention for : Neurological problems;Post surgical problems  Umberto Pavek, Lemar Livings 03/11/2013, 8:55 AM

## 2013-03-09 NOTE — Evaluation (Signed)
Occupational Therapy Assessment and Plan  Patient Details  Name: Kayla [name] MRN: [id number] Date of Birth: [birth year]  OT Diagnosis: muscle weakness (generalized) and pain in joint Rehab Potential: Rehab Potential: Excellent ELOS: 10-14 days   Today's Date: 03/09/2013 Time: 0730-0830 and 1115-1200 Time Calculation (min): 60 min and 45 min   Problem List:  Patient Active Problem List   Diagnosis Date Noted  . Urinary retention with incomplete bladder emptying 03/09/2013  . Leucocytosis 03/09/2013  . Femoral neck fracture 03/08/2013  . CAP (community acquired pneumonia) 02/26/2013  . Unspecified constipation 02/26/2013  . HCAP (healthcare-associated pneumonia) 02/26/2013  . Acute diastolic heart failure 02/26/2013  . Vocal cord paralysis 02/26/2013  . Acute respiratory failure with hypoxia 02/24/2013  . Pain 02/24/2013  . Fractured femoral neck 02/23/2013  . Hip fracture 02/23/2013  . Excessive flatus 10/21/2012  . Cold feet 07/29/2012  . Hyponatremia 05/20/2012  . Calf pain 11/17/2011  . Back pain 11/27/2010  . DYSPNEA 12/06/2007  . OSTEOPENIA 09/17/2007  . HYPOTHYROIDISM 04/01/2007  . Multiple sclerosis 04/01/2007  . Other diseases of vocal cords 04/01/2007    Past Medical History:  Past Medical History  Diagnosis Date  . Multiple sclerosis   . Unspecified hypothyroidism   . Other voice and resonance disorders   . Other diseases of vocal cords   . Cervical cancer 2005   Past Surgical History:  Past Surgical History  Procedure Laterality Date  . Vesicovaginal fistula closure w/ tah    . Hip arthroplasty Right 03/01/2013    Procedure: ARTHROPLASTY BIPOLAR HIP;  Surgeon: Shelda Pal, MD;  Location: [facility] ORS;  Service: Orthopedics;  Laterality: Right;    Assessment & Plan Clinical Impression: Patient is a 62 y.o. 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 Surgical intervention recommended by Dr. Charlann Boxer but on 02/24/13 she developed dyspnea and hypoxia with intermittent stridor and was transferred to ICU. Dr. Emeline Darling consulted for input and laryngoscopy with stable bilateral true vocal fold paresis, narrow~19mm glottic airway anteriorly----ok for intubation. She developed fever on 12/05 due to pseudomonas UTI as well as suspicion of aspiration PNA and was treated broad antibiotic coverage. Neurology consulted for input on worsening of weakness as family reported increased fatigue and weakness lower and upper extremities prior to admission with recommendation to start steroid dose pack. She was treated with IV solumedrol X three days per Dr. Delight Ovens input. Has completed 10 day course of Levaquin today--procalcitonin levels <0.10.  Once stabilized, she underwent right hip hemi by Dr. Charlann Boxer on 03/01/13 and placed on lovenox for DVT prophylaxis. She was extubated without difficulty and MBS done post op to evaluate swallow function. MBS done revealed moderate pharyngeal and cervical esophageal dysphagia and patient placed on D2, thin liquids. She has had issues with pain and lethargy but is showing some improvement in participation with therapy. Continues to have increased oxygen needs with activity. CXR with left base atelectasis rather than consolidation and pulmonary toilet recommended by CCM. As mentation improving, diet advanced to D3, thin with compensatory strategies. Urinary retention treated with foley. Patient transferred to CIR on 03/08/2013 .    Patient currently requires total assist LB dressing and toileting, min assist UB dressing, min assist grooming, and mod assist functional transfers with basic self-care skills secondary to muscle  weakness and decreased cardiorespiratoy endurance.  Prior to hospitalization, patient  could complete BADLs with independent-modified independent.  Patient will benefit from skilled intervention to increase independence with basic self-care skills and increase level of independence with iADL prior to discharge home with care partner.  Anticipate patient will require no supervision secondary to modified independent goals and follow up home health.  OT - End of Session Activity Tolerance: Decreased this session Endurance Deficit: Yes OT Assessment Rehab Potential: Excellent OT Patient demonstrates impairments in the following area(s): Balance;Endurance;Motor;Safety OT Basic ADL's Functional Problem(s): Grooming;Bathing;Dressing;Toileting OT Advanced ADL's Functional Problem(s): Simple Meal Preparation;Laundry OT Transfers Functional Problem(s): Toilet;Tub/Shower OT Additional Impairment(s): Other (comment) (MS impacting activity tolerance) OT Plan OT Intensity: Minimum of 1-2 x/day, 45 to 90 minutes OT Frequency: 5 out of 7 days OT Duration/Estimated Length of Stay: 10-14 days OT Treatment/Interventions: Balance/vestibular training;Community reintegration;Discharge planning;DME/adaptive equipment instruction;Pain management;Patient/family education;Self Care/advanced ADL retraining;Therapeutic Activities;Therapeutic Exercise;UE/LE Strength taining/ROM;UE/LE Coordination activities OT Basic Self-Care Anticipated Outcome(s): Mod I  OT Toileting Anticipated Outcome(s): Mod I OT Bathroom Transfers Anticipated Outcome(s): Mod I OT Recommendation Patient destination: Home Follow Up Recommendations: Home health OT Equipment Recommended: Tub/shower bench   Skilled Therapeutic Intervention Session 1: Pt seen for ADL retraining with focus on activity tolerance, functional transfers, and adherence to hip precautions. Pt requesting to eat part of breakfast. Pt required mod cues for chin tuck and other swallow precaution with daughter and therapist providing cues. Pt required max cues to  follow posterior hip precautions after discussing them with pt. She reported she was aware of precaution however unable to verbalize them. Pt very fatigued throughout session and on 2L O2. Educated on pursed lip breathing throughout with O2 sats >90% however following sit<>stand and transfer O2 sats dropped to 82-84% requiring increased time to reach >90%. Completed sit<>stand and functional transfers with mod assist and increased time. At end of session pt left sitting in w/c with daughter present and all needs in reach.  Session 2: Therapy session focused on use of AE, functional transfers, and activity tolerance. Pt received supine with bed. Required mod assist for bed mobility and stand pivot transfer bed>w/c using RW. O2 sat dropped to 82% and HR at 101. Required increased time and cues for pursed lip breathing. Completed toilet transfer with mod assist and elevated toilet seat. Required increased time and long rest break following transfer. Pt attempted clothing management however too fatigue to complete. Practiced using AE following demonstration from therapist. Pt returned demonstration with reacher and sock-aid with min cues and increased time. At end of session pt left sitting in w/c with all needs in reach and daughter present.   OT Evaluation Precautions/Restrictions  Precautions Precautions: Fall;Posterior Hip Restrictions Weight Bearing Restrictions: No RLE Weight Bearing: Weight bearing as tolerated Other Position/Activity Restrictions: has MS General   Vital Signs Oxygen Therapy O2 Device: Nasal cannula O2 Flow Rate (L/min): 2 L/min Pain Pain Assessment Pain Assessment: No/denies pain Pain Score: 0-No pain Home Living/Prior Functioning Home Living Family/patient expects to be discharged to:: Unsure Living Arrangements: Spouse/significant other Available Help at Discharge: Available PRN/intermittently Type of Home: House Home Access: Stairs to enter [employer]  of Steps: 4 Entrance Stairs-Rails: Right;Left Home Layout: Two level;Able to live on main level with bedroom/bathroom Alternate Level Stairs-Number of Steps: 12 Alternate Level Stairs-Rails: Can reach both;Left;Right Additional Comments: 4 STE home with bilat hand rails (having second rail installed this week); neighbor able to install ramp, if needed. 12 steps to lower level/basement (laundry room).  Prior Function Level of Independence: Independent with basic ADLs;Requires assistive device for independence;Independent with transfers;Independent with homemaking with ambulation;Independent with gait;Other (comment)  Able to Take Stairs?: Yes Driving: Yes Vocation: Part time employment Vocation Requirements: Worked PT desk job at [employer] Leisure: Hobbies-no Comments: h/o of relapsing MS affecting UE/LE strength; drop foot  (R>L) not corrected with orthotics ADL   Vision/Perception  Vision - History Baseline Vision: Wears glasses all the time Patient Visual Report: No change from baseline Vision - Assessment Eye Alignment: Within Functional Limits Vision Assessment: Vision not tested Perception Perception: Within Functional Limits Praxis Praxis: Intact  Cognition Overall Cognitive Status: Within Functional Limits for tasks assessed Orientation Level: Oriented X4 Attention: Alternating Alternating Attention: Appears intact Memory: Impaired Memory Impairment: Decreased recall of new information Awareness: Appears intact Problem Solving: Appears intact Safety/Judgment: Appears intact Sensation Sensation Light Touch: Appears Intact Proprioception: Appears Intact Coordination Gross Motor Movements are Fluid and Coordinated: No Fine Motor Movements are Fluid and Coordinated: Yes Motor    Mobility     Trunk/Postural Assessment     Balance   Extremity/Trunk Assessment RUE Assessment RUE Assessment: Within Functional Limits LUE Assessment LUE Assessment: Within  Functional Limits  FIM:  FIM - Eating Eating Activity: 5: Supervision/cues FIM - Grooming Grooming Steps: Wash, rinse, dry face;Wash, rinse, dry hands;Oral care, brush teeth, clean dentures Grooming: 4: Patient completes 3 of 4 or 4 of 5 steps FIM - Upper Body Dressing/Undressing Upper body dressing/undressing steps patient completed: Thread/unthread left sleeve of pullover shirt/dress;Put head through opening of pull over shirt/dress;Pull shirt over trunk Upper body dressing/undressing: 4: Min-Patient completed 75 plus % of tasks FIM - Lower Body Dressing/Undressing Lower body dressing/undressing: 1: Total-Patient completed less than 25% of tasks FIM - Toileting Toileting: 1: Total-Patient completed zero steps, helper did all 3 FIM - Banker Devices: Walker;Arm rests Bed/Chair Transfer: 3: Supine > Sit: Mod A (lifting assist/Pt. 50-74%/lift 2 legs;3: Bed > Chair or W/C: Mod A (lift or lower assist) FIM - Diplomatic Services operational officer Devices: Walker;Elevated toilet seat Toilet Transfers: 3-To toilet/BSC: Mod A (lift or lower assist);3-From toilet/BSC: Mod A (lift or lower assist)   Refer to Care Plan for Long Term Goals  Recommendations for other services: None  Discharge Criteria: Patient will be discharged from OT if patient refuses treatment 3 consecutive times without medical reason, if treatment goals not met, if there is a change in medical status, if patient makes no progress towards goals or if patient is discharged from hospital.  The above assessment, treatment plan, treatment alternatives and goals were discussed and mutually agreed upon: by patient and by family (daughter).  Saryiah Bencosme N 03/09/2013, 12:12 PM

## 2013-03-09 NOTE — Evaluation (Signed)
Physical Therapy Assessment and Plan  Patient Details  Name: Kayla [name] MRN: [id number] Date of Birth: [birth year]  PT Diagnosis: Abnormal posture, Abnormality of gait, Edema, Hypertonia and Muscle weakness Rehab Potential: Good ELOS: 10-14 days   Today's Date: 03/09/2013 Time: 0800-0900 and 1430-1455 Time Calculation (min): 60 min and 25 min  Problem List:  Patient Active Problem List   Diagnosis Date Noted  . Urinary retention with incomplete bladder emptying 03/09/2013  . Leucocytosis 03/09/2013  . Dysphagia, unspecified(787.20) 03/09/2013  . Femoral neck fracture 03/08/2013  . CAP (community acquired pneumonia) 02/26/2013  . Unspecified constipation 02/26/2013  . HCAP (healthcare-associated pneumonia) 02/26/2013  . Acute diastolic heart failure 02/26/2013  . Vocal cord paralysis 02/26/2013  . Acute respiratory failure with hypoxia 02/24/2013  . Pain 02/24/2013  . Fractured femoral neck 02/23/2013  . Hip fracture 02/23/2013  . Excessive flatus 10/21/2012  . Cold feet 07/29/2012  . Hyponatremia 05/20/2012  . Calf pain 11/17/2011  . Back pain 11/27/2010  . DYSPNEA 12/06/2007  . OSTEOPENIA 09/17/2007  . HYPOTHYROIDISM 04/01/2007  . Multiple sclerosis 04/01/2007  . Other diseases of vocal cords 04/01/2007    Past Medical History:  Past Medical History  Diagnosis Date  . Multiple sclerosis   . Unspecified hypothyroidism   . Other voice and resonance disorders   . Other diseases of vocal cords   . Cervical cancer 2005   Past Surgical History:  Past Surgical History  Procedure Laterality Date  . Vesicovaginal fistula closure w/ tah    . Hip arthroplasty Right 03/01/2013    Procedure: ARTHROPLASTY BIPOLAR HIP;  Surgeon: Shelda Pal, MD;  Location: [facility];  Service: Orthopedics;  Laterality: Right;    Assessment & Plan Clinical Impression:Patient is a 62 y.o. 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 Surgical intervention recommended by Dr. Charlann Boxer but on 02/24/13 she developed dyspnea and hypoxia with intermittent stridor and was transferred to ICU. Dr. Emeline Darling consulted for input and laryngoscopy with stable bilateral true vocal fold paresis, narrow~2mm glottic airway anteriorly----ok for intubation. She developed fever on 12/05 due to pseudomonas UTI as well as suspicion of aspiration PNA and was treated broad antibiotic coverage. Neurology consulted for input on worsening of weakness as family reported increased fatigue and weakness lower and upper extremities prior to admission with recommendation to start steroid dose pack. She was treated with IV solumedrol X three days per Dr. Delight Ovens input. Has completed 10 day course of Levaquin today--procalcitonin levels <0.10.  Once stabilized, she underwent right hip hemi by Dr. Charlann Boxer on 03/01/13 and placed on lovenox for DVT prophylaxis. She was extubated without difficulty and MBS done post op to evaluate swallow function. MBS done revealed moderate pharyngeal and cervical esophageal dysphagia and patient placed on D2, thin liquids. She has had issues with pain and lethargy but is showing some improvement in participation with therapy. Continues to have increased oxygen needs with activity. CXR with left base atelectasis rather than consolidation and pulmonary toilet recommended by CCM. As mentation improving, diet advanced to D3, thin with compensatory strategies. Urinary retention treated with foley. Patient transferred to CIR on 03/08/2013 .   Patient currently requires mod assist with mobility secondary to muscle weakness, decreased cardiorespiratoy endurance and decreased oxygen support, impaired timing and sequencing, abnormal tone and  unbalanced muscle activation and decreased  sitting balance, decreased standing balance, decreased postural control, decreased balance strategies and difficulty maintaining precautions.  Prior to hospitalization, patient was independent  with household mobility, modified independent with community mobility and lived with Spouse in a House home.  Home access is 4Stairs to enter.  Patient will benefit from skilled PT intervention to maximize safe functional mobility and minimize fall risk for planned discharge home with intermittent assist.  Anticipate patient will benefit from follow up [facility] at discharge.  PT - End of Session Activity Tolerance: Tolerates 10 - 20 min activity with multiple rests Endurance Deficit: Yes Endurance Deficit Description: SpO2 decreased to 88% during stand-pivot transfer from bed>w/c. SpO2 increased to 97% with verbal cues for slow, diaphragmatic breathing. PT Assessment Rehab Potential: Good Barriers to Discharge: Decreased caregiver support;Other (comment) (husband works full-time but able to provide intermittent assist) PT Patient demonstrates impairments in the following area(s): Balance;Edema;Endurance;Motor;Safety PT Transfers Functional Problem(s): Bed Mobility;Bed to Chair;Car;Furniture;Floor PT Locomotion Functional Problem(s): Ambulation;Wheelchair Mobility;Stairs PT Plan PT Intensity: Minimum of 1-2 x/day ,45 to 90 minutes PT Frequency: 5 out of 7 days PT Duration Estimated Length of Stay: 10-14 days PT Treatment/Interventions: Ambulation/gait training;Balance/vestibular training;Discharge planning;DME/adaptive equipment instruction;Functional mobility training;Patient/family education;Neuromuscular re-education;Splinting/orthotics;Stair training PT Transfers Anticipated Outcome(s): Supervision-Mod I PT Locomotion Anticipated Outcome(s): Min A-Mod I PT Recommendation Recommendations for Other Services: Speech consult Follow Up Recommendations: Home health PT;24 hour supervision/assistance Patient  destination: Home Equipment Recommended: To be determined Equipment Details: Pt owns personal standard cane and standard walker  Skilled Therapeutic Intervention Treatment Session 1: Pt received seated in w/c with daughter present.Pt on 2L/min supplemental O2, [place] throughout session. Increased O2 to 3L/min for short time after bed<>w/c transfer secondary to brief decrease in SpO2 to 88%. Oriented x4. Able to recall 2 of 3 hip precautions. PT evaluation initiated. See below for detailed findings. Initiated treatment, which focused on bed mobility, functional transfers, and w/c mobility. Verbal explanation and PT demonstration provided for sit<>stand technique, hand placement, and safe/appropriate use of rolling walker. Educated pt on management of w/c parts, technique for w/c self-propulsion using bilat UE's. Educated pt on diaphragmatic breathing to address SOB and decreased SpO2. Pt with inconsistent carryover. Pt required frequent cues to adhere to R posterior hip precautions during functional mobility. Therapist departed with pt semi-reclined in bed with daughter present, bed alarm on, 3 rails up, and all needs within reach. Pt on 2L/min supplemental O2, [place] with SpO2 97% and HR 89.  Treatment Session 2: Pt received seated in w/c with daughter present. Session focused on initiation of gait training. Pt performed supine<>sit with HOB elevated and use of bed rail with mod A for management of RLE and trunk. With pt seated EOB, donned ACE wraps at bilat ankles to facilitate dorsiflexion. Pt performed sit>stand from elevated bed height to rolling walker with min A, effective carryover of 75% of education provided during AM session for safe transfer technique. Gait x25' with rolling walker and mod A, mod verbal cueing for pt adherence to hip precautions. Pt on 2L supplemental O2, [place] throughout session. Increased O2 to 3L/min after gait trial secondary to decrease in SpO2 to 89%, difficulty increasing to >91% with  breathing techniques. With supplemental O2 at 3L/min and use of breathing techniques, SpO2 increased to 98% within 30 seconds.Therapist departed with pt semi-reclined in bed with daughter and family friend present, bed alarm on, 3 rails up, and all needs within reach. Pt on 2L/min supplemental O2, [place] with SpO2 96%.  PT Evaluation Precautions/Restrictions Precautions Precautions: Fall;Posterior Hip Precaution Comments: 2L supplemental O2, [place]. Monitor O2 sats. Restrictions Weight Bearing Restrictions: No RLE Weight Bearing: Weight bearing as tolerated Other Position/Activity Restrictions: has MS General Amount of Missed PT Time (min): 5 Minutes Missed Time Reason: Nursing care Vital Signs  Pain  Pt reports no pain during PT session. Home Living/Prior Functioning Home Living Available Help at Discharge: Available PRN/intermittently Type of Home: House Home Access: Stairs to enter Entrance Stairs-Number of Steps: 4 Entrance Stairs-Rails: Right;Left Home Layout: Two level;Able to live on main level with bedroom/bathroom Alternate Level Stairs-Number of Steps: 12 Alternate Level Stairs-Rails: Can reach both;Left;Right Additional Comments: 4 STE home with bilat hand rails (having second rail installed this week); neighbor able to install ramp, if needed. 12 steps to lower level/basement (laundry room).  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 Leisure: Hobbies-no Comments: h/o of relapsing MS affecting UE/LE strength; drop foot  (R>L) not corrected with orthotics Vision/Perception   Vision - History Baseline Vision: Wears glasses all the time Patient Visual Report: No change from baseline Vision - Assessment Eye Alignment: Within Functional Limits Vision Assessment: Vision not tested Perception Perception: Within Functional Limits Praxis Praxis: Intact  Cognition Overall Cognitive Status: Within Functional Limits for tasks assessed Orientation Level: Oriented X4 Attention: Alternating Alternating Attention: Appears intact Memory: Impaired Memory Impairment: Decreased recall of new information Awareness: Appears intact Problem Solving: Appears intact Safety/Judgment: Appears intact Comments: Able to recall 2 of 3 hip precautions after OT explained during first session this am. Sensation Sensation Light Touch: Appears Intact Proprioception: Appears Intact Coordination Gross Motor Movements are Fluid and Coordinated: No Fine Motor Movements are Fluid and Coordinated: Yes Motor  Motor Motor: Abnormal tone;Abnormal postural alignment and control Motor - Skilled Clinical Observations: In seated/standing, majority of weight on L side, causing passive elongation of trunk on L. Spasticity in bilat LE's.  Mobility Bed Mobility Bed Mobility: Scooting to HOB;Supine to Sit;Sit to Supine Supine to Sit: 3: Mod assist Supine to Sit: Patient Percentage: 60% Supine to Sit Details: Verbal cues for precautions/safety;Verbal cues for sequencing;Manual facilitation for placement Sit to Supine: Patient Percentage: 50% Scooting to HOB: 1: +2 Total assist Scooting to [facility]: Patient Percentage: 10% Scooting to [facility] Details: Verbal cues for precautions/safety;Verbal cues for technique Scooting to [facility] Details (indicate cue type and reason):  Pt attempted to assist using LLE and bilat UE's; +2A required secondary to pt fatigue. Transfers Transfers: Yes Sit to Stand: 3: Mod assist;4: Min assist Sit to Stand Details: Verbal cues for technique;Verbal cues for safe use of  DME/AE;Verbal cues for precautions/safety Sit to Stand Details (indicate cue type and reason): Cueing for setup, hand placement, safe use of rolling walker Stand to Sit: 3: Mod assist Stand to Sit Details (indicate cue type and reason): Verbal cues for precautions/safety;Manual facilitation for placement Stand to Sit Details: Mod A to control descent; verbal cueing for safe hand placement Stand Pivot Transfers: 3: Mod assist;With armrests Stand Pivot Transfer Details: Verbal cues for safe use of DME/AE;Verbal cues for technique Stand Pivot Transfer Details (indicate cue type and reason): From bed<>w/c with rolling walker, mod A and verbal cues to adhere to precautions Locomotion  Ambulation Ambulation:  Yes Ambulation/Gait Assistance: 3: Mod assist Ambulation Distance (Feet): 25 Feet Assistive device: Rolling walker Ambulation/Gait Assistance Details: ACE bandages at bilat ankles for dorsiflexion Gait Gait: Yes Gait Pattern: Impaired Gait Pattern: Step-through pattern;Decreased stride length High Level Ambulation High Level Ambulation: Side stepping Side Stepping: mod A, manual facilitation of bilat weight shifting Stairs / Additional Locomotion Stairs: No Wheelchair Mobility Wheelchair Mobility: Yes Wheelchair Assistance: 4: Administrator, sports Details: Verbal cues for Diplomatic Services operational officer: Both upper extremities Wheelchair Parts Management: Needs assistance Distance: 25  Trunk/Postural Assessment  Cervical Assessment Cervical Assessment: Within Functional Limits Thoracic Assessment Thoracic Assessment: Within Functional Limits Lumbar Assessment Lumbar Assessment: Within Functional Limits Postural Control Postural Control: Deficits on evaluation Trunk Control: With LE AROM while seated EOM, excessive trunk lean to side contralateral of LE being moved Postural Limitations: Seated: thoracolumbar lateral flexion to R side secondary to decreased  weightbearing on R hip.  Balance Balance Balance Assessed: Yes Static Sitting Balance Static Sitting - Balance Support: No upper extremity supported;Feet supported Static Sitting - Level of Assistance: 5: Stand by assistance Static Sitting - Comment/# of Minutes: 7 Dynamic Sitting Balance Dynamic Sitting - Balance Support: Bilateral upper extremity supported;Feet supported Dynamic Sitting - Level of Assistance: 5: Stand by assistance Dynamic Sitting - Comments: During LE MMT, pt required bilat UE support and SBA for stability/balance Static Standing Balance Static Standing - Balance Support: Bilateral upper extremity supported Static Standing - Level of Assistance: 4: Min assist Static Standing - Comment/# of Minutes: Static standing with bilat UE support at rolling walker > 1 min with min guard for stability/balance Dynamic Standing Balance Dynamic Standing - Balance Support: During functional activity Dynamic Standing - Level of Assistance: 3: Mod assist;4: Min assist Dynamic Standing - Balance Activities: Lateral lean/weight shifting Dynamic Standing - Comments: Dynamic standing balance (side stepping) with rolling walker, mod A Extremity Assessment  RLE Assessment RLE Assessment: Exceptions to [facility] RLE Strength RLE Overall Strength: Deficits RLE Overall Strength Comments: R hip grossly 3-/5 in all planes; R knee 3/5 to 3+/6; R ankle grossly 4.5 in all planes RLE Tone RLE Tone: Mild;Moderate RLE Tone Comments: R ankle plantarflexors LLE Assessment LLE Assessment: Exceptions to [facility] LLE Strength LLE Overall Strength: Deficits LLE Overall Strength Comments: L hip frossly 3+/5 to 4-/5 in all  planes; L knee 4-/5; L ankle 3+/5 to 4-/5 LLE Tone LLE Tone Comments: moderate spasticity in L quadriceps; mild in L ankle plantarflexors  FIM:  FIM - Locomotion: Wheelchair Distance: 25 FIM - Locomotion: Ambulation Ambulation/Gait Assistance: 3: Mod assist   Refer to Care Plan for Long  Term Goals  Recommendations for other services: None  Discharge Criteria: Patient will be discharged from PT if patient refuses treatment 3 consecutive times without medical reason, if treatment goals not met, if there is a change in medical status, if patient makes no progress towards goals or if patient is discharged from hospital.  The above assessment, treatment plan, treatment alternatives and goals were discussed and mutually agreed upon: by patient and by family  Calvert Cantor 03/09/2013, 7:42 PM

## 2013-03-10 ENCOUNTER — Inpatient Hospital Stay (HOSPITAL_COMMUNITY): Payer: 59

## 2013-03-10 ENCOUNTER — Inpatient Hospital Stay (HOSPITAL_COMMUNITY): Payer: 59 | Admitting: Physical Therapy

## 2013-03-10 DIAGNOSIS — S72009A Fracture of unspecified part of neck of unspecified femur, initial encounter for closed fracture: Secondary | ICD-10-CM

## 2013-03-10 DIAGNOSIS — D62 Acute posthemorrhagic anemia: Secondary | ICD-10-CM

## 2013-03-10 DIAGNOSIS — G35 Multiple sclerosis: Secondary | ICD-10-CM

## 2013-03-10 LAB — CBC
HCT: 31.3 % — ABNORMAL LOW (ref 36.0–46.0)
Hemoglobin: 10.8 g/dL — ABNORMAL LOW (ref 12.0–15.0)
MCH: 29 pg (ref 26.0–34.0)
MCHC: 34.5 g/dL (ref 30.0–36.0)
MCV: 83.9 fL (ref 78.0–100.0)
Platelets: 276 10*3/uL (ref 150–400)
RBC: 3.73 MIL/uL — ABNORMAL LOW (ref 3.87–5.11)
RDW: 15.3 % (ref 11.5–15.5)
WBC: 13.5 10*3/uL — ABNORMAL HIGH (ref 4.0–10.5)

## 2013-03-10 LAB — URINE CULTURE
Colony Count: NO GROWTH
Culture: NO GROWTH

## 2013-03-10 MED ORDER — GUAIFENESIN-CODEINE 100-10 MG/5ML PO SOLN
5.0000 mL | Freq: Two times a day (BID) | ORAL | Status: DC | PRN
  Administered 2013-03-10: 5 mL via ORAL
  Filled 2013-03-10: qty 5

## 2013-03-10 NOTE — Evaluation (Signed)
Speech Language Pathology Assessment and Plan  Patient Details  Name: Kayla [name] MRN: [id number] Date of Birth: 1951-03-02  SLP Diagnosis: Cognitive Impairments;Dysphagia;Other (comment) (exacerbation of voice disorder)  Rehab Potential: Good ELOS: 10-14 days   Today's Date: 03/10/2013 Time: 0830-0930 Time Calculation (min): 60 min  Problem List:  Patient Active Problem List   Diagnosis Date Noted  . Urinary retention with incomplete bladder emptying 03/09/2013  . Leucocytosis 03/09/2013  . Dysphagia, unspecified(787.20) 03/09/2013  . Femoral neck fracture 03/08/2013  . CAP (community acquired pneumonia) 02/26/2013  . Unspecified constipation 02/26/2013  . HCAP (healthcare-associated pneumonia) 02/26/2013  . Acute diastolic heart failure 02/26/2013  . Vocal cord paralysis 02/26/2013  . Acute respiratory failure with hypoxia 02/24/2013  . Pain 02/24/2013  . Fractured femoral neck 02/23/2013  . Hip fracture 02/23/2013  . Excessive flatus 10/21/2012  . Cold feet 07/29/2012  . Hyponatremia 05/20/2012  . Calf pain 11/17/2011  . Back pain 11/27/2010  . DYSPNEA 12/06/2007  . OSTEOPENIA 09/17/2007  . HYPOTHYROIDISM 04/01/2007  . Multiple sclerosis 04/01/2007  . Other diseases of vocal cords 04/01/2007   Past Medical History:  Past Medical History  Diagnosis Date  . Multiple sclerosis   . Unspecified hypothyroidism   . Other voice and resonance disorders   . Other diseases of vocal cords   . Cervical cancer 2005   Past Surgical History:  Past Surgical History  Procedure Laterality Date  . Vesicovaginal fistula closure w/ tah    . Hip arthroplasty Right 03/01/2013    Procedure: ARTHROPLASTY BIPOLAR HIP;  Surgeon: Shelda Pal, MD;  Location: [facility];  Service: Orthopedics;  Laterality: Right;    Assessment / Plan / Recommendation Clinical Impression  Pt is a 63 y.o. female with a PMH significant for MS diagnosed 18 years, JC virus positive currently treated with  monthly tysabri infusions, hypothyroidism, cervical cancer, vocal cord paresis, admitted to [facility] 02/23/13 with right displaced femoral neck fracture after a fall at home. Surgical intervention recommended by Dr. Charlann Boxer but on 02/24/13 she developed dyspnea and hypoxia with intermittent stridor and was transferred to ICU. Dr. Emeline Darling consulted for input and laryngoscopy with stable bilateral true vocal fold paresis, narrow~23mm glottic airway anteriorly--ok for intubation. She developed fever on 12/05 due to pseudomonas UTI as well as suspicion of aspiration PNA and was treated broad antibiotic coverage. Neurology consulted for input on worsening of weakness as family reported increased fatigue and weakness lower and upper extremities prior to admission with recommendation to start steroid dose pack. Once stabilized, she underwent right hip hemi 03/01/13. She was extubated without difficulty and MBS done post op revealed moderate pharyngeal and cervical esophageal dysphagia and patient placed on D2, thin liquids. She has had issues with pain and lethargy but is showing some improvement in participation with therapy. Continues to have increased oxygen needs with activity. CXR with left base atelectasis rather than consolidation and pulmonary toilet recommended by CCM. As mentation improving, diet advanced to D3, thin with compensatory strategies. Therapies ongoing and CIR recommended for progression. Pt was admitted 12/16 and SLP cognitive and bedside swallow evals were completed 12/18. Pt presents with impairments with recall of new information as well as complex problem solving that pt and family reports is exacerbated from baseline. Pt also reports worsened vocal quality and reduced breath support. She exhibits a baseline cough that was observed intermittently throughout PO intake. Pt was able to verbalize her compensatory strategies and use them throughout meal. Recommend to continue current diet  with intermittent level  of supervision as mentation appears to be improving. Pt will benefit from SLP services to maximize swallowing safety, communication, and functional independence prior to discharge home.    SLP Assessment  Patient will need skilled Speech Lanaguage Pathology Services during CIR admission    Recommendations  Diet Recommendations: Dysphagia 3 (Mechanical Soft);Thin liquid Liquid Administration via: Cup;No straw Medication Administration: Whole meds with puree Supervision: Patient able to self feed;Intermittent supervision to cue for compensatory strategies Compensations: Slow rate;Small sips/bites;Multiple dry swallows after each bite/sip;Follow solids with liquid Postural Changes and/or Swallow Maneuvers: Seated upright 90 degrees;Upright 30-60 min after meal;Chin tuck (chin tuck with solids) Oral Care Recommendations: Oral care BID Patient destination: Home Follow up Recommendations: Home Health SLP Equipment Recommended: None recommended by SLP    SLP Frequency 5 out of 7 days   SLP Treatment/Interventions Cognitive remediation/compensation;Cueing hierarchy;Dysphagia/aspiration precaution training;Environmental controls;Functional tasks;Internal/external aids;Patient/family education;Therapeutic Activities;Therapeutic Exercise    Pain Pain Assessment Pain Assessment: No/denies pain Prior Functioning Cognitive/Linguistic Baseline: Within functional limits Type of Home: House  Lives With: Spouse Available Help at Discharge: Available PRN/intermittently 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)  Short Term Goals: Week 1: SLP Short Term Goal 1 (Week 1): Pt will utilize safe swallow strategies with Dys 3 textures and thin liquids with Mod I to reduce overt s/s of aspiration SLP Short Term Goal 2 (Week 1): Pt will consume trials of regular textures with Min cues for use of safe swallow strategies to assess readiness for advancement SLP Short Term Goal 3 (Week 1): Pt will  demonstrate adequate diaphragmatic breathing for increased vocal intensity at the word level with Min cues SLP Short Term Goal 4 (Week 1): Pt will utilize external memory aids with supervision level cues SLP Short Term Goal 5 (Week 1): Pt will demonstrate complex problem solving with Min cues  See FIM for current functional status Refer to Care Plan for Long Term Goals  Recommendations for other services: None  Discharge Criteria: Patient will be discharged from SLP if patient refuses treatment 3 consecutive times without medical reason, if treatment goals not met, if there is a change in medical status, if patient makes no progress towards goals or if patient is discharged from hospital.  The above assessment, treatment plan, treatment alternatives and goals were discussed and mutually agreed upon: by patient and by family   Maxcine Ham, M.A. CCC-SLP [phone]   Maxcine Ham 03/10/2013, 11:14 AM

## 2013-03-10 NOTE — Progress Notes (Signed)
Subjective/Complaints: 62 y.o. 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 Surgical intervention recommended by Dr. Charlann Boxer but on 02/24/13 she developed dyspnea and hypoxia with intermittent stridor and was transferred to ICU. Dr. Emeline Darling consulted for input and laryngoscopy with stable bilateral true vocal fold paresis, narrow~47mm glottic airway anteriorly----ok for intubation. She developed fever on 12/05 due to pseudomonas UTI as well as suspicion of aspiration PNA and was treated broad antibiotic coverage. Neurology consulted for input on worsening of weakness as family reported increased fatigue and weakness lower and upper extremities prior to admission with recommendation to start steroid dose pack. She was treated with IV solumedrol X three days per Dr. Delight Ovens input. Has completed 10 day course of Levaquin today--procalcitonin levels <0.10.  Once stabilized, she underwent right hip hemi by Dr. Charlann Boxer on 03/01/13 and placed on lovenox for DVT prophylaxis. She was extubated without difficulty and MBS done post op to evaluate swallow function. MBS done revealed moderate pharyngeal and cervical esophageal dysphagia and patient placed on D2, thin liquids  Feels better after transfusion of  2 Units PRBC and shower Objective: Vital Signs: Blood pressure 119/70, pulse 75, temperature 98.8 F (37.1 C), temperature source Oral, resp. rate 18, height 5' 2.99" (1.6 m), weight 63.05 kg (139 lb), SpO2 97.00%. No results found. Results for orders placed during the hospital encounter of 03/08/13 (from the past 72 hour(s))  PROCALCITONIN     Status: None   Collection Time    03/09/13  4:50 AM      Result Value Range   Procalcitonin <0.10     Comment:             Interpretation:     PCT (Procalcitonin) <= 0.5 ng/mL:     Systemic infection (sepsis) is not likely.     Local bacterial infection is possible.     (NOTE)             ICU PCT Algorithm               Non ICU PCT Algorithm        ----------------------------     ------------------------------             PCT < 0.25 ng/mL                 PCT < 0.1 ng/mL         Stopping of antibiotics            Stopping of antibiotics           strongly encouraged.               strongly encouraged.        ----------------------------     ------------------------------           PCT level decrease by               PCT < 0.25 ng/mL           >= 80% from peak PCT           OR PCT 0.25 - 0.5 ng/mL          Stopping of antibiotics  encouraged.         Stopping of antibiotics               encouraged.        ----------------------------     ------------------------------           PCT level decrease by              PCT >= 0.25 ng/mL           < 80% from peak PCT            AND PCT >= 0.5 ng/mL            Continuing antibiotics                                                  encouraged.           Continuing antibiotics                encouraged.        ----------------------------     ------------------------------         PCT level increase compared          PCT > 0.5 ng/mL             with peak PCT AND              PCT >= 0.5 ng/mL             Escalation of antibiotics                                              strongly encouraged.          Escalation of antibiotics            strongly encouraged.  CBC WITH DIFFERENTIAL     Status: Abnormal   Collection Time    03/09/13  4:50 AM      Result Value Range   WBC 15.7 (*) 4.0 - 10.5 K/uL   RBC 2.62 (*) 3.87 - 5.11 MIL/uL   Hemoglobin 7.6 (*) 12.0 - 15.0 g/dL   HCT 16.1 (*) 09.6 - 04.5 %   MCV 85.5  78.0 - 100.0 fL   MCH 29.0  26.0 - 34.0 pg   MCHC 33.9  30.0 - 36.0 g/dL   RDW 40.9  81.1 - 91.4 %    Platelets 295  150 - 400 K/uL   Neutrophils Relative % 76  43 - 77 %   Lymphocytes Relative 11 (*) 12 - 46 %   Monocytes Relative 9  3 - 12 %   Eosinophils Relative 3  0 - 5 %   Basophils Relative 1  0 - 1 %   Neutro Abs 11.9 (*) 1.7 - 7.7 K/uL   Lymphs Abs 1.7  0.7 - 4.0 K/uL   Monocytes Absolute 1.4 (*) 0.1 - 1.0 K/uL   Eosinophils Absolute 0.5  0.0 - 0.7 K/uL   Basophils Absolute 0.2 (*) 0.0 - 0.1 K/uL   RBC Morphology RARE NRBCs     Comment: POLYCHROMASIA PRESENT     SCHISTOCYTES PRESENT (2-5/hpf)   WBC Morphology       Value: MODERATE LEFT SHIFT (>5% METAS AND  MYELOS,OCC PRO NOTED)   Comment: ATYPICAL LYMPHOCYTES     TOXIC GRANULATION   Smear Review LARGE PLATELETS PRESENT    COMPREHENSIVE METABOLIC PANEL     Status: Abnormal   Collection Time    03/09/13  4:50 AM      Result Value Range   Sodium 133 (*) 135 - 145 mEq/L   Potassium 3.9  3.5 - 5.1 mEq/L   Chloride 98  96 - 112 mEq/L   CO2 27  19 - 32 mEq/L   Glucose, Bld 101 (*) 70 - 99 mg/dL   BUN 12  6 - 23 mg/dL   Creatinine, Ser 1.61  0.50 - 1.10 mg/dL   Calcium 8.1 (*) 8.4 - 10.5 mg/dL   Total Protein 5.0 (*) 6.0 - 8.3 g/dL   Albumin 2.3 (*) 3.5 - 5.2 g/dL   AST 22  0 - 37 U/L   ALT 13  0 - 35 U/L   Alkaline Phosphatase 55  39 - 117 U/L   Total Bilirubin 0.4  0.3 - 1.2 mg/dL   GFR calc non Af Amer >90  >90 mL/min   GFR calc Af Amer >90  >90 mL/min   Comment: (NOTE)     The eGFR has been calculated using the CKD EPI equation.     This calculation has not been validated in all clinical situations.     eGFR's persistently <90 mL/min signify possible Chronic Kidney     Disease.  URINALYSIS, ROUTINE W REFLEX MICROSCOPIC     Status: None   Collection Time    03/09/13  4:53 AM      Result Value Range   Color, Urine YELLOW  YELLOW   APPearance CLEAR  CLEAR   Specific Gravity, Urine 1.020  1.005 - 1.030   pH 6.5  5.0 - 8.0   Glucose, UA NEGATIVE  NEGATIVE mg/dL   Hgb urine dipstick NEGATIVE  NEGATIVE   Bilirubin  Urine NEGATIVE  NEGATIVE   Ketones, ur NEGATIVE  NEGATIVE mg/dL   Protein, ur NEGATIVE  NEGATIVE mg/dL   Urobilinogen, UA 0.2  0.0 - 1.0 mg/dL   Nitrite NEGATIVE  NEGATIVE   Leukocytes, UA NEGATIVE  NEGATIVE   Comment: MICROSCOPIC NOT DONE ON URINES WITH NEGATIVE PROTEIN, BLOOD, LEUKOCYTES, NITRITE, OR GLUCOSE <1000 mg/dL.  URINE CULTURE     Status: None   Collection Time    03/09/13  4:53 AM      Result Value Range   Specimen Description URINE, RANDOM     Special Requests NONE     Culture  Setup Time       Value: 03/09/2013 09:17     Performed at [facility] Count       Value: NO GROWTH     Performed at [facility]   Culture       Value: NO GROWTH     Performed at [facility]   Report Status 03/10/2013 FINAL    PREPARE RBC (CROSSMATCH)     Status: None   Collection Time    03/09/13  4:30 PM      Result Value Range   Order Confirmation ORDER PROCESSED BY BLOOD BANK    TYPE AND SCREEN     Status: None   Collection Time    03/09/13  6:00 PM      Result Value Range   ABO/RH(D) O POS     Antibody Screen NEG     Sample  Expiration 03/12/2013     Unit Number [id number]     Blood Component Type RED CELLS,LR     Unit division 00     Status of Unit ISSUED,FINAL     Transfusion Status OK TO TRANSFUSE     Crossmatch Result Compatible     Unit Number [id number]     Blood Component Type RED CELLS,LR     Unit division 00     Status of Unit ISSUED     Transfusion Status OK TO TRANSFUSE     Crossmatch Result Compatible    ABO/RH     Status: None   Collection Time    03/09/13  6:00 PM      Result Value Range   ABO/RH(D) O POS       HEENT: O2 [place] Cardio: tachy and no murmurs Resp: Occ Wheezes and mildly labored GI: BS positive and non distended Extremity:  Pulses positive and No Edema Skin:   Intact Neuro: Alert/Oriented, Cranial Nerve II-XII normal, Normal Sensory and Abnormal Motor 4/5 R DELT , BI,TRI,GRIP, 3-/5 B HF,  L KE AND B  ADF, 2-R KE Musc/Skel:  Other HIP ROM not tested Gen NAD   Assessment/Plan: 1. Functional deficits secondary to R FNF, PNA, MS which require 3+ hours per day of interdisciplinary therapy in a comprehensive inpatient rehab setting. Physiatrist is providing close team supervision and 24 hour management of active medical problems listed below. Physiatrist and rehab team continue to assess barriers to discharge/monitor patient progress toward functional and medical goals. FIM:    FIM - Upper Body Dressing/Undressing Upper body dressing/undressing steps patient completed: Thread/unthread left sleeve of pullover shirt/dress;Put head through opening of pull over shirt/dress;Pull shirt over trunk Upper body dressing/undressing: 4: Min-Patient completed 75 plus % of tasks FIM - Lower Body Dressing/Undressing Lower body dressing/undressing: 1: Total-Patient completed less than 25% of tasks  FIM - Toileting Toileting: 1: Total-Patient completed zero steps, helper did all 3  FIM - Diplomatic Services operational officer Devices: Walker;Elevated toilet seat Toilet Transfers: 3-To toilet/BSC: Mod A (lift or lower assist);3-From toilet/BSC: Mod A (lift or lower assist)  FIM - Banker Devices: Walker;Arm rests Bed/Chair Transfer: 3: Supine > Sit: Mod A (lifting assist/Pt. 50-74%/lift 2 legs;3: Bed > Chair or W/C: Mod A (lift or lower assist)  FIM - Locomotion: Wheelchair Distance: 25 Locomotion: Wheelchair: 1: Travels less than 50 ft with minimal assistance (Pt.>75%) FIM - Locomotion: Ambulation Locomotion: Ambulation Assistive Devices: Walker - Rolling (dorsiflexion wraps at bilat ankles) Ambulation/Gait Assistance: 3: Mod assist Locomotion: Ambulation: 1: Travels less than 50 ft with moderate assistance (Pt: 50 - 74%)  Comprehension Comprehension Mode: Auditory Comprehension: 5-Understands complex 90% of the time/Cues < 10% of the  time  Expression Expression Mode: Verbal Expression: 5-Expresses basic needs/ideas: With no assist  Social Interaction Social Interaction: 5-Interacts appropriately 90% of the time - Needs monitoring or encouragement for participation or interaction.  Problem Solving Problem Solving: 5-Solves basic problems: With no assist  Memory Memory: 5-Recognizes or recalls 90% of the time/requires cueing < 10% of the time  Medical Problem List and Plan:  Left femur fracture complicated by aspiration PNA, Pseudomona UTI and recent MS exacerbation.  1. DVT Prophylaxis/Anticoagulation: Pharmaceutical: Lovenox  2. Pain Management: Has excessive sedation with narcotics.  -Tylenol prn. Ice post therapy and prn.  -She is complaining of little pain at this point and seemed to handle this morning's acitivites well.  3. Mood: pt is motivated and  ready to work. Team to provide ego support as appropriate.  4. Neuropsych: This patient is capable of making decisions on her own behalf.  5. MS with neurogenic bladder/ Pseudomonas UTI with urinary retention:  - Stop Toviaz. Pro calcitonin level<.1 -Neg  UA.  -Monitor voiding with PVR checks.  6. Aspiration PNA: Has completed antibiotic regimen.  Wheezing improved, will cont inhalers, unable to tolerate steroids -Encourage IS with flutter valve.  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  -continued oxygen via [place]---need to make sure oxymetry readings are accurate  8. Leukocytosis: Reactive due to PNA--resolving.  9. ABLA: Is being monitored and at 7.4-8.1 range. Has increased oxygen needs as well as fatigue with activity.  - Recheck in am.  10 Chronic hyponatremia: 133 borderline low will monitor   LOS (Days) 2 A FACE TO FACE EVALUATION WAS PERFORMED  Annali Lybrand E 03/10/2013, 8:35 AM

## 2013-03-10 NOTE — Progress Notes (Signed)
Physical Therapy Note  Patient Details  Name: Kayla [name] MRN: [id number] Date of Birth: [birth year] Today's Date: 03/10/2013  Time: 1400-1455 55 minutes  1:1 Pt c/o hip pain, RN made aware, meds rec'd.  Gait with RW on 3 L O2 multiple attempts in controlled environment.  Pt able to walk up to 100' before seated rest.  Pt spO2 95-98% with gait on 3L O2.  Pt with scissoring gait, B LEs internally rotated, narrow BOS.  Cues for widening BOS and hip ER, pt able to correct for 5-10 steps before fatiguing and returning to old pattern.  Standing activities with focus on glute and hip abd/ER strengthening with stepping and reaching activities.  Pt fatigues easily but improved with repetition.  Pt min A for gait and balance activities, min-mod A for sit to stand.   Aldwin Micalizzi 03/10/2013, 2:56 PM

## 2013-03-10 NOTE — Progress Notes (Signed)
Occupational Therapy Session Note  Patient Details  Name: Kayla [name] MRN: [id number] Date of Birth: January 09, 1951  Today's Date: 03/10/2013 Time: 0730-0830 and 4132-4401 Time Calculation (min): 60 min and 27 min   Short Term Goals: Week 1:  OT Short Term Goal 1 (Week 1): Pt will complete functional transfers with min assist and no verbal cues OT Short Term Goal 2 (Week 1): Pt will complete toilet task with min assist OT Short Term Goal 3 (Week 1): Pt will complete LB dressing using AE with min assist OT Short Term Goal 4 (Week 1): Pt will utilize pursed breathing technique with min cues when fatigued during therapy session  Skilled Therapeutic Interventions/Progress Updates:    Session 1: Pt seen for ADL retraining with focus on carryover of hip precautions, activity tolerance, and use of AE. Pt received supine in bed. Vitals signed monitored throughout session and see below for details. Pt required increased time for shower task and recalled hip precautions during function without cues as she requested for LH sponge and reacher for LB self-care. Required min assist for sit<>stand during bathing and mod assist for sit<>stand during LB dressing. Pt with good carryover of using reacher requiring slight increased time. Pt required increased time for all transfers and frequent rest breaks. Therapist provided mod cues for rest breaks during session.   Session 2: Pt received supine in bed requesting to complete toilet task. Required mod assist for supine >sit and stand pivot transfers using RW bed>w/c and w/c<>toilet. Pt required min assist for standing balance during clothing management and hygiene. Pt required frequent rest breaks and educated on pursed lip breathing throughout as she was SOB however SpO2>92% and HR 80-84. Pt then practiced sit<>stand x6 trials with rest breaks between each. She began with mod assist and progressed to min assist. Pt required mod verbal cues for technique/positioning  of UEs.  Therapy Documentation Precautions:  Precautions Precautions: Fall;Posterior Hip Precaution Comments: 2L supplemental O2, [place]. Monitor O2 sats. Restrictions Weight Bearing Restrictions: Yes RLE Weight Bearing: Weight bearing as tolerated Other Position/Activity Restrictions: has MS General:   Vital Signs: Supine: BP 95/62 and HR 72 Sitting EOB BP: 117/67 and HR 83 After stand pivot transfer BP: 111/66 and HR 85 SpO2>90% throughout session with once occasion of dropping to 87% post transfer. Pt on 2L O2.   Pain: Pt reports 3/10 pain at RLE and declines pain meds. Pain not impacting therapy sessions.   See FIM for current functional status  Therapy/Group: Individual Therapy  Daneil Dan 03/10/2013, 12:02 PM

## 2013-03-11 ENCOUNTER — Inpatient Hospital Stay (HOSPITAL_COMMUNITY): Payer: 59 | Admitting: Speech Pathology

## 2013-03-11 ENCOUNTER — Inpatient Hospital Stay (HOSPITAL_COMMUNITY): Payer: 59 | Admitting: Physical Therapy

## 2013-03-11 ENCOUNTER — Inpatient Hospital Stay (HOSPITAL_COMMUNITY): Payer: 59

## 2013-03-11 ENCOUNTER — Encounter (HOSPITAL_COMMUNITY): Payer: 59

## 2013-03-11 DIAGNOSIS — D62 Acute posthemorrhagic anemia: Secondary | ICD-10-CM | POA: Diagnosis present

## 2013-03-11 LAB — TYPE AND SCREEN
ABO/RH(D): O POS
Antibody Screen: NEGATIVE
Unit division: 0
Unit division: 0

## 2013-03-11 MED ORDER — ACETAMINOPHEN 325 MG PO TABS
650.0000 mg | ORAL_TABLET | Freq: Three times a day (TID) | ORAL | Status: DC
  Administered 2013-03-11 – 2013-03-12 (×7): 650 mg via ORAL
  Filled 2013-03-11 (×8): qty 2

## 2013-03-11 NOTE — Progress Notes (Signed)
Nursing Note: Pt in and out cathed for 250 cc.Cathed by [employer].Pt okay w/ this.wbb

## 2013-03-11 NOTE — Progress Notes (Signed)
Speech Language Pathology Daily Session Note  Patient Details  Name: Kayla [name] MRN: [id number] Date of Birth: 11/07/1950  Today's Date: 03/11/2013 Time: 9147-8295 Time Calculation (min): 45 min  Short Term Goals: Week 1: SLP Short Term Goal 1 (Week 1): Pt will utilize safe swallow strategies with Dys 3 textures and thin liquids with Mod I to reduce overt s/s of aspiration SLP Short Term Goal 2 (Week 1): Pt will consume trials of regular textures with Min cues for use of safe swallow strategies to assess readiness for advancement SLP Short Term Goal 3 (Week 1): Pt will demonstrate adequate diaphragmatic breathing for increased vocal intensity at the word level with Min cues SLP Short Term Goal 4 (Week 1): Pt will utilize external memory aids with supervision level cues SLP Short Term Goal 5 (Week 1): Pt will demonstrate complex problem solving with Min cues  Skilled Therapeutic Interventions: Treatment focus on speech and dysphagia goals. Pt recalled swallowing compensatory strategies with Mod I. SLP facilitated session by providing supervision verbal cues for efficient use of chin tuck (chin tucked to chest) and double swallows. Pt also required supervision-Min A verbal cues for pacing due to increased respiratory rate and fatigue throughout the meal. Pt had one strong cough throughout the meal, however, difficult to differentiate due to swallowing or mucous. Pt able to orally expectorate at end of coughing episode.  Pt's vocal intensity appeared stronger and pt reports she is currently back at her baseline vocal intensity and quality. Continue plan of care.    FIM:  Comprehension Comprehension Mode: Auditory Comprehension: 5-Understands complex 90% of the time/Cues < 10% of the time Expression Expression Mode: Verbal Expression: 5-Expresses basic needs/ideas: With no assist Social Interaction Social Interaction: 5-Interacts appropriately 90% of the time - Needs monitoring or  encouragement for participation or interaction. Problem Solving Problem Solving: 5-Solves basic problems: With no assist Memory Memory: 5-Recognizes or recalls 90% of the time/requires cueing < 10% of the time FIM - Eating Eating Activity: 5: Supervision/cues  Pain Pain Assessment Pain Assessment: No/denies pain Pain Score: 0-No pain  Therapy/Group: Individual Therapy  [name] 03/11/2013, 9:34 AM

## 2013-03-11 NOTE — Progress Notes (Addendum)
Physical Therapy Session Note  Patient Details  Name: Kayla [name] MRN: [id number] Date of Birth: [birth year]  Today's Date: 03/11/2013 Time: 4098-1191 Time Calculation (min): 48 min  Short Term Goals: Week 1:  PT Short Term Goal 1 (Week 1): Pt to perform bed mobility with min A using rails with HOB flat. PT Short Term Goal 2 (Week 1): Pt to perform bed<>chair transfer with min A and rolling walker. PT Short Term Goal 3 (Week 1): Pt to perform w/c propulsion x75' with supervision while maintaining SpO2 >91%. PT Short Term Goal 4 (Week 1): Pt to perform gait x15' with min A using LRAD.  Skilled Therapeutic Interventions/Progress Updates:    Treatment Session 1: Pt received seated in w/c with daughter present. Pt agreeable to PT session, which focused on assessing/addressing gait pattern without use of bilateral dorsiflexion/recurvatum wraps. Performed sit<>stand from bed to rolling walker with min A. Ambulation x56', x80' with rolling walker, min A and manual assist at posterior R knee to control R genu recurvatum (onset within initial 20' of gait) in R terminal stance of gait. Verbal cueing during gait trials focused on widening BOS (to address scissoring gait pattern) and correcting excessive R hip internal rotation to facilitation pt adherence to posterior hip precautions.Performed w/c propulsion x75' in controlled environment using bilat UE's with supervision. Therapist transported pt remaining distance to room, where session ended. Therapist departed with pt seated in beside chair with daughter present and all needs within reach. Pt remained on 2L of supplemental O2, [place] throughout session with SpO2 >97%.   Treatment Session 2: Pt received semi-reclined in bed; agreeable to session. Performed supine>sit with min A for RLE management. Able to verbalize 3 of 3 hip precautions with min cueing, increased time to recall final precaution. Session focused on initiation of stair negotiation, continued  assessment of gait pattern, possible need for R ankle-foot orthotic to promote safety with functional mobility. Donned ACE wrap to decrease R genu recurvatum during gait/dynamic standing activity. Pt negotiated 5 steps with bilat hand rails and +2A for stability/balance; second therapist required for safe/proper LE placement; verbal cueing for sequencing.   Performed gait x120', x20', and x54' with rolling walker and min A for stability/balance. Pt exhibits R hip adduction.internal rotation during RLE swing phase, which is self-corrected during RLE initial contact. R genu recurvatum during RLE terminal stance is initially less prominent with rec wrap; however, pt repositioned in w/c during final gait trial (after gait x54') due to onset of bilat recurvatum secondary to pt fatigue. Therapist transferred pt remaining distance to room, where session ended. Husband present upon return to pt room. Performed w/c>bed transfer with min A without assistive device. Sit>supine with mod A for bilat LE management secondary to pt fatigue.  Therapist departed with pt semi-reclined in bed with bed alarm on, 3 bed rails up, husband present, and all needs within reach. Pt remained on 2L of supplemental O2, [place] throughout session with SpO2 >94%.  Therapy Documentation Precautions:  Precautions Precautions: Fall;Posterior Hip Precaution Comments: 2L supplemental O2, [place]. Monitor O2 sats. Restrictions Weight Bearing Restrictions: No RLE Weight Bearing: Weight bearing as tolerated Other Position/Activity Restrictions: has MS General: Amount of Missed PT Time (min): 12 Minutes Missed Time Reason: Nursing care Vital Signs: Therapy Vitals Temp: 98.5 F (36.9 C) Temp src: Oral Pulse Rate: 75 Resp: 20 BP: 93/60 mmHg Patient Position, if appropriate: Lying Oxygen Therapy SpO2: 96 % O2 Device: Nasal cannula O2 Flow Rate (L/min): 2 L/min Pain: Pain Assessment  Pain Assessment: 0-10 Pain Score: 5  Pain Type: Surgical  pain Pain Location: Hip Pain Orientation: Right Pain Descriptors / Indicators: Aching Pain Onset: With Activity Patients Stated Pain Goal: 3 Pain Intervention(s): RN made aware;Rest;Repositioned Multiple Pain Sites: No Locomotion : Ambulation Ambulation/Gait Assistance: 4: Min assist   See FIM for current functional status  Therapy/Group: Individual Therapy  Calvert Cantor 03/11/2013, 6:44 PM

## 2013-03-11 NOTE — Progress Notes (Signed)
Subjective/Complaints: 62 y.o. 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 Surgical intervention recommended by Dr. Charlann Boxer but on 02/24/13 she developed dyspnea and hypoxia with intermittent stridor and was transferred to ICU. Dr. Emeline Darling consulted for input and laryngoscopy with stable bilateral true vocal fold paresis, narrow~78mm glottic airway anteriorly----ok for intubation. She developed fever on 12/05 due to pseudomonas UTI as well as suspicion of aspiration PNA and was treated broad antibiotic coverage. Neurology consulted for input on worsening of weakness as family reported increased fatigue and weakness lower and upper extremities prior to admission with recommendation to start steroid dose pack. She was treated with IV solumedrol X three days per Dr. Delight Ovens input. Has completed 10 day course of Levaquin today--procalcitonin levels <0.10.  Once stabilized, she underwent right hip hemi by Dr. Charlann Boxer on 03/01/13 and placed on lovenox for DVT prophylaxis. She was extubated without difficulty and MBS done post op to evaluate swallow function. MBS done revealed moderate pharyngeal and cervical esophageal dysphagia and patient placed on D2, thin liquids  Feels better after transfusion of  2 Units PRBC and shower Pain in hip with activity Objective: Vital Signs: Blood pressure 106/57, pulse 91, temperature 99.3 F (37.4 C), temperature source Oral, resp. rate 20, height 5' 2.99" (1.6 m), weight 63.05 kg (139 lb), SpO2 94.00%. No results found. Results for orders placed during the hospital encounter of 03/08/13 (from the past 72 hour(s))  PROCALCITONIN     Status: None   Collection Time    03/09/13  4:50 AM      Result Value Range   Procalcitonin <0.10     Comment:             Interpretation:     PCT (Procalcitonin) <= 0.5 ng/mL:     Systemic infection (sepsis) is not likely.     Local bacterial infection is possible.     (NOTE)             ICU PCT Algorithm               Non ICU PCT Algorithm        ----------------------------     ------------------------------             PCT < 0.25 ng/mL                 PCT < 0.1 ng/mL         Stopping of antibiotics            Stopping of antibiotics           strongly encouraged.               strongly encouraged.        ----------------------------     ------------------------------           PCT level decrease by               PCT < 0.25 ng/mL           >= 80% from peak PCT           OR PCT 0.25 - 0.5 ng/mL          Stopping of antibiotics  encouraged.         Stopping of antibiotics               encouraged.        ----------------------------     ------------------------------           PCT level decrease by              PCT >= 0.25 ng/mL           < 80% from peak PCT            AND PCT >= 0.5 ng/mL            Continuing antibiotics                                                  encouraged.           Continuing antibiotics                encouraged.        ----------------------------     ------------------------------         PCT level increase compared          PCT > 0.5 ng/mL             with peak PCT AND              PCT >= 0.5 ng/mL             Escalation of antibiotics                                              strongly encouraged.          Escalation of antibiotics            strongly encouraged.  CBC WITH DIFFERENTIAL     Status: Abnormal   Collection Time    03/09/13  4:50 AM      Result Value Range   WBC 15.7 (*) 4.0 - 10.5 K/uL   RBC 2.62 (*) 3.87 - 5.11 MIL/uL   Hemoglobin 7.6 (*) 12.0 - 15.0 g/dL   HCT 16.1 (*) 09.6 - 04.5 %   MCV 85.5  78.0 - 100.0 fL   MCH 29.0  26.0 - 34.0 pg   MCHC 33.9  30.0 - 36.0 g/dL   RDW 40.9  81.1 -  91.4 %   Platelets 295  150 - 400 K/uL   Neutrophils Relative % 76  43 - 77 %   Lymphocytes Relative 11 (*) 12 - 46 %   Monocytes Relative 9  3 - 12 %   Eosinophils Relative 3  0 - 5 %   Basophils Relative 1  0 - 1 %   Neutro Abs 11.9 (*) 1.7 - 7.7 K/uL   Lymphs Abs 1.7  0.7 - 4.0 K/uL   Monocytes Absolute 1.4 (*) 0.1 - 1.0 K/uL   Eosinophils Absolute 0.5  0.0 - 0.7 K/uL   Basophils Absolute 0.2 (*) 0.0 - 0.1 K/uL   RBC Morphology RARE NRBCs     Comment: POLYCHROMASIA PRESENT     SCHISTOCYTES PRESENT (2-5/hpf)   WBC Morphology       Value: MODERATE LEFT SHIFT (>5% METAS AND  MYELOS,OCC PRO NOTED)   Comment: ATYPICAL LYMPHOCYTES     TOXIC GRANULATION   Smear Review LARGE PLATELETS PRESENT    COMPREHENSIVE METABOLIC PANEL     Status: Abnormal   Collection Time    03/09/13  4:50 AM      Result Value Range   Sodium 133 (*) 135 - 145 mEq/L   Potassium 3.9  3.5 - 5.1 mEq/L   Chloride 98  96 - 112 mEq/L   CO2 27  19 - 32 mEq/L   Glucose, Bld 101 (*) 70 - 99 mg/dL   BUN 12  6 - 23 mg/dL   Creatinine, Ser 9.60  0.50 - 1.10 mg/dL   Calcium 8.1 (*) 8.4 - 10.5 mg/dL   Total Protein 5.0 (*) 6.0 - 8.3 g/dL   Albumin 2.3 (*) 3.5 - 5.2 g/dL   AST 22  0 - 37 U/L   ALT 13  0 - 35 U/L   Alkaline Phosphatase 55  39 - 117 U/L   Total Bilirubin 0.4  0.3 - 1.2 mg/dL   GFR calc non Af Amer >90  >90 mL/min   GFR calc Af Amer >90  >90 mL/min   Comment: (NOTE)     The eGFR has been calculated using the CKD EPI equation.     This calculation has not been validated in all clinical situations.     eGFR's persistently <90 mL/min signify possible Chronic Kidney     Disease.  URINALYSIS, ROUTINE W REFLEX MICROSCOPIC     Status: None   Collection Time    03/09/13  4:53 AM      Result Value Range   Color, Urine YELLOW  YELLOW   APPearance CLEAR  CLEAR   Specific Gravity, Urine 1.020  1.005 - 1.030   pH 6.5  5.0 - 8.0   Glucose, UA NEGATIVE  NEGATIVE mg/dL   Hgb urine dipstick NEGATIVE  NEGATIVE    Bilirubin Urine NEGATIVE  NEGATIVE   Ketones, ur NEGATIVE  NEGATIVE mg/dL   Protein, ur NEGATIVE  NEGATIVE mg/dL   Urobilinogen, UA 0.2  0.0 - 1.0 mg/dL   Nitrite NEGATIVE  NEGATIVE   Leukocytes, UA NEGATIVE  NEGATIVE   Comment: MICROSCOPIC NOT DONE ON URINES WITH NEGATIVE PROTEIN, BLOOD, LEUKOCYTES, NITRITE, OR GLUCOSE <1000 mg/dL.  URINE CULTURE     Status: None   Collection Time    03/09/13  4:53 AM      Result Value Range   Specimen Description URINE, RANDOM     Special Requests NONE     Culture  Setup Time       Value: 03/09/2013 09:17     Performed at [facility] Count       Value: NO GROWTH     Performed at [facility]   Culture       Value: NO GROWTH     Performed at [facility]   Report Status 03/10/2013 FINAL    PREPARE RBC (CROSSMATCH)     Status: None   Collection Time    03/09/13  4:30 PM      Result Value Range   Order Confirmation ORDER PROCESSED BY BLOOD BANK    TYPE AND SCREEN     Status: None   Collection Time    03/09/13  6:00 PM      Result Value Range   ABO/RH(D) O POS     Antibody Screen NEG     Sample  Expiration 03/12/2013     Unit Number [id number]     Blood Component Type RED CELLS,LR     Unit division 00     Status of Unit ISSUED,FINAL     Transfusion Status OK TO TRANSFUSE     Crossmatch Result Compatible     Unit Number [id number]     Blood Component Type RED CELLS,LR     Unit division 00     Status of Unit ISSUED     Transfusion Status OK TO TRANSFUSE     Crossmatch Result Compatible    ABO/RH     Status: None   Collection Time    03/09/13  6:00 PM      Result Value Range   ABO/RH(D) O POS    CBC     Status: Abnormal   Collection Time    03/10/13  8:00 AM      Result Value Range   WBC 13.5 (*) 4.0 - 10.5 K/uL   RBC 3.73 (*) 3.87 - 5.11 MIL/uL   Hemoglobin 10.8 (*) 12.0 - 15.0 g/dL   Comment: POST TRANSFUSION SPECIMEN   HCT 31.3 (*) 36.0 - 46.0 %   MCV 83.9  78.0 - 100.0 fL   MCH 29.0   26.0 - 34.0 pg   MCHC 34.5  30.0 - 36.0 g/dL   RDW 40.1  02.7 - 25.3 %   Platelets 276  150 - 400 K/uL     HEENT: O2 [place] Cardio: tachy and no murmurs Resp: No Wheezes and un labored GI: BS positive and non distended Extremity:  Pulses positive and No Edema Skin:   Intact Neuro: Alert/Oriented, Cranial Nerve II-XII normal, Normal Sensory and Abnormal Motor 4/5 R DELT , BI,TRI,GRIP, 3-/5 B HF,  L KE AND B ADF, 2-R KE Musc/Skel:  Other HIP ROM not tested Gen NAD   Assessment/Plan: 1. Functional deficits secondary to R FNF, PNA, MS which require 3+ hours per day of interdisciplinary therapy in a comprehensive inpatient rehab setting. Physiatrist is providing close team supervision and 24 hour management of active medical problems listed below. Physiatrist and rehab team continue to assess barriers to discharge/monitor patient progress toward functional and medical goals. FIM: FIM - Bathing Bathing Steps Patient Completed: Chest;Right Arm;Left Arm;Left upper leg;Abdomen;Right upper leg;Front perineal area;Right lower leg (including foot);Left lower leg (including foot) Bathing: 4: Min-Patient completes 8-9 38f 10 parts or 75+ percent  FIM - Upper Body Dressing/Undressing Upper body dressing/undressing steps patient completed: Thread/unthread left sleeve of pullover shirt/dress;Put head through opening of pull over shirt/dress;Pull shirt over trunk;Thread/unthread right sleeve of pullover shirt/dresss;Hook/unhook bra;Thread/unthread left bra strap;Thread/unthread right bra strap Upper body dressing/undressing: 5: Set-up assist to: Obtain clothing/put away FIM - Lower Body Dressing/Undressing Lower body dressing/undressing steps patient completed: Thread/unthread right underwear leg;Thread/unthread left underwear leg;Thread/unthread right pants leg;Thread/unthread left pants leg Lower body dressing/undressing: 3: Mod-Patient completed 50-74% of tasks  FIM - Toileting Toileting steps  completed by patient: Adjust clothing prior to toileting;Performs perineal hygiene Toileting Assistive Devices: Grab bar or rail for support Toileting: 3: Mod-Patient completed 2 of 3 steps  FIM - Diplomatic Services operational officer Devices: Walker;Elevated toilet seat Toilet Transfers: 3-To toilet/BSC: Mod A (lift or lower assist);3-From toilet/BSC: Mod A (lift or lower assist)  FIM - Banker Devices: Walker;Arm rests Bed/Chair Transfer: 3: Supine > Sit: Mod A (lifting assist/Pt. 50-74%/lift 2 legs;3: Bed > Chair or W/C: Mod A (lift or lower assist)  FIM - Locomotion: Wheelchair  Distance: 25 Locomotion: Wheelchair: 1: Travels less than 50 ft with minimal assistance (Pt.>75%) FIM - Locomotion: Ambulation Locomotion: Ambulation Assistive Devices: Walker - Rolling (dorsiflexion wraps at bilat ankles) Ambulation/Gait Assistance: 3: Mod assist Locomotion: Ambulation: 1: Travels less than 50 ft with moderate assistance (Pt: 50 - 74%)  Comprehension Comprehension Mode: Auditory Comprehension: 5-Follows basic conversation/direction: With no assist  Expression Expression Mode: Verbal Expression: 5-Expresses basic needs/ideas: With no assist  Social Interaction Social Interaction: 5-Interacts appropriately 90% of the time - Needs monitoring or encouragement for participation or interaction.  Problem Solving Problem Solving: 5-Solves complex 90% of the time/cues < 10% of the time  Memory Memory: 5-Recognizes or recalls 90% of the time/requires cueing < 10% of the time  Medical Problem List and Plan:  Left femur fracture complicated by aspiration PNA, Pseudomona UTI and recent MS exacerbation.  1. DVT Prophylaxis/Anticoagulation: Pharmaceutical: Lovenox  2. Pain Management: Has excessive sedation with narcotics.  -Tylenol prn. Ice post therapy and prn.  -She is complaining of acitivity related pain at this point try tylenol scheduled  3.  Mood: pt is motivated and ready to work. Team to provide ego support as appropriate.  4. Neuropsych: This patient is capable of making decisions on her own behalf.  5. MS with neurogenic bladder/ Pseudomonas UTI with urinary retention:  - Stop Toviaz. Pro calcitonin level<.1 -Neg  UA.  -Monitor voiding with PVR checks.  6. Aspiration PNA: Has completed antibiotic regimen.  Wheezing improved, will cont inhalers, unable to tolerate steroids -Encourage IS with flutter valve.  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  -continued oxygen via [place]---overall improving 8. Leukocytosis: Reactive due to PNA, plus IV steroid burst--resolving.  9. ABLA: Is being monitored and at 7.4-8.1 range. Has increased oxygen needs as well as fatigue with activity.  - Recheck in am.  10 Chronic hyponatremia: 133 borderline low will monitor   LOS (Days) 3 A FACE TO FACE EVALUATION WAS PERFORMED  KIRSTEINS,ANDREW E 03/11/2013, 7:41 AM

## 2013-03-11 NOTE — Progress Notes (Signed)
Occupational Therapy Session Note  Patient Details  Name: Kayla [name] MRN: [id number] Date of Birth: May 03, 1950  Today's Date: 03/11/2013 Time: 0900-1000 Time Calculation (min): 60 min  Short Term Goals: Week 1:  OT Short Term Goal 1 (Week 1): Pt will complete functional transfers with min assist and no verbal cues OT Short Term Goal 2 (Week 1): Pt will complete toilet task with min assist OT Short Term Goal 3 (Week 1): Pt will complete LB dressing using AE with min assist OT Short Term Goal 4 (Week 1): Pt will utilize pursed breathing technique with min cues when fatigued during therapy session  Skilled Therapeutic Interventions/Progress Updates:    Pt engaged in bathing at shower level and dressing with sit<>stand from w/c.  Pt recalled 2/3 hip precautions but was able to adhere to all precautions during bathing and dressing tasks.  Pt used AE (sponge, reacher, and sock aid) independently throughout session.  Pt requires min a for sit<>stand in shower and mod A for stand pivot transfers.  Pt requires multiple rest breaks throughout session.  Focus on activity tolerance, transfers, standing balance, sit<>stand, and safety awareness.  Therapy Documentation Precautions:  Precautions Precautions: Fall;Posterior Hip Precaution Comments: 2L supplemental O2, [place]. Monitor O2 sats. Restrictions Weight Bearing Restrictions: No RLE Weight Bearing: Weight bearing as tolerated Other Position/Activity Restrictions: has MS   Pain: Pain Assessment Pain Assessment: No/denies pain Pain Score: 0-No pain  See FIM for current functional status  Therapy/Group: Individual Therapy  Rich Brave 03/11/2013, 10:03 AM

## 2013-03-11 NOTE — Progress Notes (Signed)
Nursing Note: MS pt for 18 years.Pt was in and out cathed by NT Arlene,for 425 cc.Pt tolerated well.wbb

## 2013-03-11 NOTE — Plan of Care (Signed)
Problem: RH BLADDER ELIMINATION Goal: RH STG MANAGE BLADDER WITH ASSISTANCE STG Manage Bladder With mod Assistance  Outcome: Not Progressing Requires in and out caths every 6-8 hrs by staff

## 2013-03-12 ENCOUNTER — Inpatient Hospital Stay (HOSPITAL_COMMUNITY): Payer: 59

## 2013-03-12 ENCOUNTER — Inpatient Hospital Stay (HOSPITAL_COMMUNITY): Payer: 59 | Admitting: Physical Therapy

## 2013-03-12 ENCOUNTER — Encounter (HOSPITAL_COMMUNITY)
Admission: RE | Disposition: A | Discharge status: Home / Self Care | Payer: Self-pay | Source: Intra-hospital | Attending: Physical Medicine & Rehabilitation

## 2013-03-12 ENCOUNTER — Inpatient Hospital Stay (HOSPITAL_COMMUNITY): Payer: 59 | Admitting: Occupational Therapy

## 2013-03-12 ENCOUNTER — Inpatient Hospital Stay (HOSPITAL_COMMUNITY): Payer: 59 | Admitting: Speech Pathology

## 2013-03-12 ENCOUNTER — Inpatient Hospital Stay (HOSPITAL_COMMUNITY): Payer: 59 | Admitting: Anesthesiology

## 2013-03-12 ENCOUNTER — Encounter (HOSPITAL_COMMUNITY): Payer: 59 | Admitting: Anesthesiology

## 2013-03-12 ENCOUNTER — Observation Stay (HOSPITAL_COMMUNITY)
Admission: AD | Admit: 2013-03-12 | Discharge: 2013-03-15 | Disposition: A | Discharge status: Rehabilitation Facility | Payer: 59 | Source: Intra-hospital | Attending: Orthopedic Surgery | Admitting: Orthopedic Surgery

## 2013-03-12 DIAGNOSIS — G35 Multiple sclerosis: Principal | ICD-10-CM | POA: Insufficient documentation

## 2013-03-12 DIAGNOSIS — F411 Generalized anxiety disorder: Secondary | ICD-10-CM | POA: Insufficient documentation

## 2013-03-12 DIAGNOSIS — R339 Retention of urine, unspecified: Secondary | ICD-10-CM

## 2013-03-12 DIAGNOSIS — M549 Dorsalgia, unspecified: Secondary | ICD-10-CM

## 2013-03-12 DIAGNOSIS — S73004A Unspecified dislocation of right hip, initial encounter: Secondary | ICD-10-CM

## 2013-03-12 DIAGNOSIS — R0902 Hypoxemia: Secondary | ICD-10-CM | POA: Insufficient documentation

## 2013-03-12 DIAGNOSIS — Z96649 Presence of unspecified artificial hip joint: Secondary | ICD-10-CM

## 2013-03-12 DIAGNOSIS — E871 Hypo-osmolality and hyponatremia: Secondary | ICD-10-CM

## 2013-03-12 DIAGNOSIS — S73005A Unspecified dislocation of left hip, initial encounter: Secondary | ICD-10-CM

## 2013-03-12 DIAGNOSIS — E039 Hypothyroidism, unspecified: Secondary | ICD-10-CM | POA: Insufficient documentation

## 2013-03-12 DIAGNOSIS — D62 Acute posthemorrhagic anemia: Secondary | ICD-10-CM | POA: Diagnosis present

## 2013-03-12 DIAGNOSIS — S72009A Fracture of unspecified part of neck of unspecified femur, initial encounter for closed fracture: Secondary | ICD-10-CM

## 2013-03-12 DIAGNOSIS — J189 Pneumonia, unspecified organism: Secondary | ICD-10-CM

## 2013-03-12 DIAGNOSIS — T84029A Dislocation of unspecified internal joint prosthesis, initial encounter: Secondary | ICD-10-CM

## 2013-03-12 DIAGNOSIS — K5900 Constipation, unspecified: Secondary | ICD-10-CM | POA: Diagnosis present

## 2013-03-12 HISTORY — PX: HIP CLOSED REDUCTION: SHX983

## 2013-03-12 SURGERY — CLOSED REDUCTION, HIP
Anesthesia: General | Site: Hip | Laterality: Right

## 2013-03-12 MED ORDER — MORPHINE SULFATE 2 MG/ML IJ SOLN
4.0000 mg | INTRAMUSCULAR | Status: DC | PRN
  Administered 2013-03-12: 4 mg via INTRAVENOUS
  Filled 2013-03-12: qty 2

## 2013-03-12 MED ORDER — MIDAZOLAM HCL 5 MG/5ML IJ SOLN: INTRAMUSCULAR | Status: DC | PRN

## 2013-03-12 MED ORDER — FENTANYL CITRATE 0.05 MG/ML IJ SOLN
INTRAMUSCULAR | Status: DC | PRN
  Administered 2013-03-12: 50 ug via INTRAVENOUS

## 2013-03-12 MED ORDER — HYDROMORPHONE HCL PF 1 MG/ML IJ SOLN: 0.2500 mg | INTRAMUSCULAR | Status: DC | PRN

## 2013-03-12 MED ORDER — MORPHINE SULFATE 2 MG/ML IJ SOLN
2.0000 mg | Freq: Once | INTRAMUSCULAR | Status: AC
  Administered 2013-03-12: 2 mg via INTRAVENOUS
  Filled 2013-03-12: qty 1

## 2013-03-12 MED ORDER — SUCCINYLCHOLINE CHLORIDE 20 MG/ML IJ SOLN
INTRAMUSCULAR | Status: DC | PRN
  Administered 2013-03-12: 100 mg via INTRAVENOUS

## 2013-03-12 MED ORDER — SODIUM CHLORIDE 0.9 % IV SOLN
INTRAVENOUS | Status: DC | PRN
  Administered 2013-03-12: 23:00:00 via INTRAVENOUS

## 2013-03-12 MED ORDER — PROPOFOL 10 MG/ML IV BOLUS
INTRAVENOUS | Status: DC | PRN
  Administered 2013-03-12: 100 mg via INTRAVENOUS

## 2013-03-12 MED ORDER — PROMETHAZINE HCL 25 MG/ML IJ SOLN: 6.2500 mg | INTRAMUSCULAR | Status: DC | PRN

## 2013-03-12 SURGICAL SUPPLY — 1 items: IMMOBILIZER KNEE 22 UNIV (SOFTGOODS) ×1 IMPLANT

## 2013-03-12 NOTE — Anesthesia Preprocedure Evaluation (Addendum)
Anesthesia Evaluation  Patient identified by MRN, date of birth, ID band Patient awake    Reviewed: Allergy & Precautions, H&P , NPO status , Patient's Chart, lab work & pertinent test results  History of Anesthesia Complications Negative for: history of anesthetic complications  Airway Mallampati: II TM Distance: >3 FB Neck ROM: Limited    Dental  (+) Dental Advisory Given and Teeth Intact   Pulmonary shortness of breath and at rest, pneumonia -, unresolved,  breath sounds clear to auscultation  - decreased breath sounds - stridor     Cardiovascular negative cardio ROS  Rhythm:Regular Rate:Normal     Neuro/Psych MS negative psych ROS   GI/Hepatic negative GI ROS, Neg liver ROS,   Endo/Other  Hypothyroidism   Renal/GU negative Renal ROS  negative genitourinary   Musculoskeletal negative musculoskeletal ROS (+)   Abdominal   Peds negative pediatric ROS (+)  Hematology  (+) anemia ,   Anesthesia Other Findings   Reproductive/Obstetrics negative OB ROS                        Anesthesia Physical  Anesthesia Plan  ASA: II and emergent  Anesthesia Plan: General   Post-op Pain Management:    Induction: Intravenous  Airway Management Planned: Mask  Additional Equipment:   Intra-op Plan:   Post-operative Plan:   Informed Consent: I have reviewed the patients History and Physical, chart, labs and discussed the procedure including the risks, benefits and alternatives for the proposed anesthesia with the patient or authorized representative who has indicated his/her understanding and acceptance.   Dental advisory given  Plan Discussed with: CRNA, Surgeon and Anesthesiologist  Anesthesia Plan Comments:       Anesthesia Quick Evaluation

## 2013-03-12 NOTE — Progress Notes (Signed)
Occupational Therapy Session Note  Patient Details  Name: Kayla [name] MRN: [id number] Date of Birth: 21-Aug-1950  Today's Date: 03/12/2013 Time: 4696-2952 Time Calculation (min): 45 min  Short Term Goals: Week 1:  OT Short Term Goal 1 (Week 1): Pt will complete functional transfers with min assist and no verbal cues OT Short Term Goal 2 (Week 1): Pt will complete toilet task with min assist OT Short Term Goal 3 (Week 1): Pt will complete LB dressing using AE with min assist OT Short Term Goal 4 (Week 1): Pt will utilize pursed breathing technique with min cues when fatigued during therapy session  Skilled Therapeutic Interventions/Progress Updates:  AM tx session: 1:1 ADL session. Pt in w/c ready to work w/OT. After s/u pt able to bathe UB w/Mod (I). Using LH sponge she was able to bathe LB with only steady assist required when standing to wash periarea. Pt able to recall THPs and use AE appropriately for LB ADLs including reacher & sock aide. Again, only steadying assist required when standing to pull clothing over hips. O2 sats >95% throughout tx session on room air only. Lunch arrived at end of tx session. Pt left in w/c w/call light & phone w/in reach.   PM tx session: 1:1. Pt's husband present. Pt participated in UE & LE therex in rehab gym for at least 30 minutes. Pt returned to room requesting to rest before ST tx session. Min guard SPT from w/c to bed w/o RW. Mod A for LE mgmt into bed. Positioning boots applied. Call light & phone w/in reach. Husband at bedside. RN in to give meds.   Therapy Documentation Precautions:  Precautions Precautions: Fall;Posterior Hip Precaution Comments: 2L supplemental O2, [place]. Monitor O2 sats. Restrictions Weight Bearing Restrictions: No RLE Weight Bearing: Weight bearing as tolerated Other Position/Activity Restrictions: has MS General:   Vital Signs: Oxygen Therapy O2 Device: Nasal cannula O2 Flow Rate (L/min): 2 L/min Pain: Pain  Assessment Pain Score: 0-No pain Faces Pain Scale: No hurt  See FIM for current functional status  Therapy/Group: Individual Therapy  Lynzi Meulemans, Deidre Ala 03/12/2013, 3:12 PM

## 2013-03-12 NOTE — Progress Notes (Signed)
Kayla [name] is a 62 y.o. female 04/16/1950 [id number]  Subjective: No new complaints. No new problems. Slept well. Feeling OK.  Objective: Vital signs in last 24 hours: Temp:  [98.5 F (36.9 C)-98.7 F (37.1 C)] 98.7 F (37.1 C) (12/20 0518) Pulse Rate:  [64-82] 64 (12/20 0518) Resp:  [16-20] 18 (12/20 0518) BP: (93-111)/(60) 111/60 mmHg (12/20 0518) SpO2:  [95 %-98 %] 98 % (12/20 0518) Weight change:  Last BM Date: 03/08/13 (refuses supp at this time,wants to wait till tomm.)  Intake/Output from previous day: 12/19 0701 - 12/20 0700 In: 600 [P.O.:600] Out: 750 [Urine:750] Last cbgs: CBG (last 3)  No results found for this basename: GLUCAP,  in the last 72 hours   Physical Exam General: No apparent distress    HEENT: moist mucosa Lungs: Normal effort. Lungs clear to auscultation, no crackles or wheezes. Cardiovascular: Regular rate and rhythm, no edema Abdomen: S/NT/ND; BS(+) Musculoskeletal:  No change from before Neurological: No new neurological deficits Wounds: N/A    Skin: clear Alert, cooperative   Lab Results: BMET    Component Value Date/Time   NA 133* 03/09/2013 0450   K 3.9 03/09/2013 0450   CL 98 03/09/2013 0450   CO2 27 03/09/2013 0450   GLUCOSE 101* 03/09/2013 0450   BUN 12 03/09/2013 0450   CREATININE 0.60 03/09/2013 0450   CALCIUM 8.1* 03/09/2013 0450   GFRNONAA >90 03/09/2013 0450   GFRAA >90 03/09/2013 0450   CBC    Component Value Date/Time   WBC 13.5* 03/10/2013 0800   RBC 3.73* 03/10/2013 0800   HGB 10.8* 03/10/2013 0800   HCT 31.3* 03/10/2013 0800   PLT 276 03/10/2013 0800   MCV 83.9 03/10/2013 0800   MCH 29.0 03/10/2013 0800   MCHC 34.5 03/10/2013 0800   RDW 15.3 03/10/2013 0800   LYMPHSABS 1.7 03/09/2013 0450   MONOABS 1.4* 03/09/2013 0450   EOSABS 0.5 03/09/2013 0450   BASOSABS 0.2* 03/09/2013 0450    Studies/Results: No results found.  Medications: I have reviewed the patient's current  medications.  Assessment/Plan:  1. DVT Prophylaxis/Anticoagulation: Pharmaceutical: Lovenox  2. Pain Management: Has excessive sedation with narcotics.  -Tylenol prn. Ice post therapy and prn.  -She is complaining of acitivity related pain at this point try tylenol scheduled  3. Mood: pt is motivated and ready to work. Team to provide ego support as appropriate.  4. Neuropsych: This patient is capable of making decisions on her own behalf.  5. MS with neurogenic bladder/ Pseudomonas UTI with urinary retention:  - Stop Toviaz. Pro calcitonin level<.1  -Neg UA.  -Monitor voiding with PVR checks.  6. Aspiration PNA: Has completed antibiotic regimen. Wheezing improved, will cont inhalers, unable to tolerate steroids  -Encourage IS with flutter valve.  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  -continued oxygen via [place]---overall improving  8. Leukocytosis: Reactive due to PNA, plus IV steroid burst--resolving.  9. ABLA: Is being monitored and at 7.4-8.1 range. Has increased oxygen needs as well as fatigue with activity.  - Recheck in am.  10 Chronic hyponatremia: 133 borderline low will monitor    Length of stay, days: 4  Alex Micaila Ziemba , MD 03/12/2013, 10:16 AM      

## 2013-03-12 NOTE — Progress Notes (Signed)
Physical Therapy Note  Patient Details  Name: Kayla [name] MRN: [id number] Date of Birth: [birth year] Today's Date: 03/12/2013  4098-1191 (55 minutes) individual Pain: 4/10 RT hip /premedicated Precautions : posterior RT  THA precautions; WBAT RT LE  Focus of treatment: Therapeutic exercise focused on bilateral LE AROM/strengthening ; gait training; wc mobility training for activity tolerance Treatment: Pt in bed upon arrival ; recalls 2/3 THA precautions; supine to sit min assist to follow hip IR precautions; sit to stand min to close SBA with difficulty; transfer stand/turn RW min assist for safety; wc mobility 120 feet SBA with increased time; sit to supine (mat) min assist RT LE; therapeutic exercises X 20 in supine- ankle pumps, heel slides (AA in right), hip abduction (AA on right), SAQs, quad sets, sit to stand to RW 2 X 5 for quad strengthening; gait 20 feet RW min assist with ace wraps bilateral ankles; returned to room with all needs within reach.   1400-1425 (25 minutes) individual Pain: 4/10 RT LE/ premedicated Focus of treatment: gait training/ activity tolerance Treatment: Pt in bed upon arrival; supine to sit min assist RT LE; gait 30 feet , 35 feet RW min to close SBA with ace wraps bilateral ankle secondary to decreased bilateral DF; sit to supine min assist RT LE; bed alarm activated.    Kayla [name],JIM 03/12/2013, 9:26 AM

## 2013-03-12 NOTE — Progress Notes (Signed)
D; Ready to discharge to rehab, said need change account number to admit to acute care,       Await to correct admition procedure.

## 2013-03-12 NOTE — Transfer of Care (Signed)
Immediate Anesthesia Transfer of Care Note  Patient: Kayla [name]  Procedure(s) Performed: Procedure(s): CLOSED REDUCTION HIP (Right)  Patient Location: PACU  Anesthesia Type:General  Level of Consciousness: awake, alert  and oriented  Airway & Oxygen Therapy: Patient Spontanous Breathing and Patient connected to nasal cannula oxygen  Post-op Assessment: Report given to PACU RN and Post -op Vital signs reviewed and stable  Post vital signs: Reviewed and stable  Complications: No apparent anesthesia complications

## 2013-03-12 NOTE — Anesthesia Procedure Notes (Signed)
Date/Time: 03/12/2013 10:55 PM Performed by: Nicholos Johns Pre-anesthesia Checklist: Patient identified, Emergency Drugs available, Suction available, Patient being monitored and Timeout performed Patient Re-evaluated:Patient Re-evaluated prior to inductionOxygen Delivery Method: Circle system utilized Preoxygenation: Pre-oxygenation with 100% oxygen Intubation Type: IV induction Ventilation: Mask ventilation without difficulty Dental Injury: Teeth and Oropharynx as per pre-operative assessment  Comments: Mask airway for closed reduction.

## 2013-03-12 NOTE — Anesthesia Postprocedure Evaluation (Signed)
Anesthesia Post Note  Patient: Kayla [name]  Procedure(s) Performed: Procedure(s) (LRB): CLOSED REDUCTION HIP (Right)  Anesthesia type: general  Patient location: PACU  Post pain: Pain level controlled  Post assessment: Patient's Cardiovascular Status Stable  Last Vitals:  Filed Vitals:   03/12/13 2311  BP: 103/54  Pulse: 86  Temp: 36.7 C  Resp: 15    Post vital signs: Reviewed and stable  Level of consciousness: sedated  Complications: No apparent anesthesia complications

## 2013-03-12 NOTE — H&P (View-Only) (Signed)
Kayla [name] is a 62 y.o. female 10-Jun-1950 [id number]  Subjective: No new complaints. No new problems. Slept well. Feeling OK.  Objective: Vital signs in last 24 hours: Temp:  [98.5 F (36.9 C)-98.7 F (37.1 C)] 98.7 F (37.1 C) (12/20 0518) Pulse Rate:  [64-82] 64 (12/20 0518) Resp:  [16-20] 18 (12/20 0518) BP: (93-111)/(60) 111/60 mmHg (12/20 0518) SpO2:  [95 %-98 %] 98 % (12/20 0518) Weight change:  Last BM Date: 03/08/13 (refuses supp at this time,wants to wait till tomm.)  Intake/Output from previous day: 12/19 0701 - 12/20 0700 In: 600 [P.O.:600] Out: 750 [Urine:750] Last cbgs: CBG (last 3)  No results found for this basename: GLUCAP,  in the last 72 hours   Physical Exam General: No apparent distress    HEENT: moist mucosa Lungs: Normal effort. Lungs clear to auscultation, no crackles or wheezes. Cardiovascular: Regular rate and rhythm, no edema Abdomen: S/NT/ND; BS(+) Musculoskeletal:  No change from before Neurological: No new neurological deficits Wounds: N/A    Skin: clear Alert, cooperative   Lab Results: BMET    Component Value Date/Time   NA 133* 03/09/2013 0450   K 3.9 03/09/2013 0450   CL 98 03/09/2013 0450   CO2 27 03/09/2013 0450   GLUCOSE 101* 03/09/2013 0450   BUN 12 03/09/2013 0450   CREATININE 0.60 03/09/2013 0450   CALCIUM 8.1* 03/09/2013 0450   GFRNONAA >90 03/09/2013 0450   GFRAA >90 03/09/2013 0450   CBC    Component Value Date/Time   WBC 13.5* 03/10/2013 0800   RBC 3.73* 03/10/2013 0800   HGB 10.8* 03/10/2013 0800   HCT 31.3* 03/10/2013 0800   PLT 276 03/10/2013 0800   MCV 83.9 03/10/2013 0800   MCH 29.0 03/10/2013 0800   MCHC 34.5 03/10/2013 0800   RDW 15.3 03/10/2013 0800   LYMPHSABS 1.7 03/09/2013 0450   MONOABS 1.4* 03/09/2013 0450   EOSABS 0.5 03/09/2013 0450   BASOSABS 0.2* 03/09/2013 0450    Studies/Results: No results found.  Medications: I have reviewed the patient's current  medications.  Assessment/Plan:  1. DVT Prophylaxis/Anticoagulation: Pharmaceutical: Lovenox  2. Pain Management: Has excessive sedation with narcotics.  -Tylenol prn. Ice post therapy and prn.  -She is complaining of acitivity related pain at this point try tylenol scheduled  3. Mood: pt is motivated and ready to work. Team to provide ego support as appropriate.  4. Neuropsych: This patient is capable of making decisions on her own behalf.  5. MS with neurogenic bladder/ Pseudomonas UTI with urinary retention:  - Stop Toviaz. Pro calcitonin level<.1  -Neg UA.  -Monitor voiding with PVR checks.  6. Aspiration PNA: Has completed antibiotic regimen. Wheezing improved, will cont inhalers, unable to tolerate steroids  -Encourage IS with flutter valve.  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  -continued oxygen via ---overall improving  8. Leukocytosis: Reactive due to PNA, plus IV steroid burst--resolving.  9. ABLA: Is being monitored and at 7.4-8.1 range. Has increased oxygen needs as well as fatigue with activity.  - Recheck in am.  10 Chronic hyponatremia: 133 borderline low will monitor    Length of stay, days: 4  Sonda Primes , MD 03/12/2013, 10:16 AM

## 2013-03-12 NOTE — H&P (Signed)
1900 RN called to room, patient reports 10/10 right hip pain. Patient claims she turned to clean herself on commode and states she may have "done something wrong".  MD on call notified and STAT pain medication given (see doc flow) in order to transfer patient comfortably from commode back to bed.  STAT xray ordered at bedside and Orthopedic MD on call notified at 1950.  RN awaiting return phone call.  Passed off in report.

## 2013-03-12 NOTE — Progress Notes (Signed)
Patient ID: Kayla [name], female   DOB: 11-Jul-1950, 62 y.o.   MRN: [id number] Paged by Brett Canales on rehab. Rehab is recommending she is transferred back to the acute side of the hospital for monitoring post-op x 1-2 days. She had a rapid response earlier today and is now post-op R hip closed reduction. They feel she would do better with more monitoring. There are beds available. Will re-admit with plan to return back to inpt rehab in 1-2 days.

## 2013-03-12 NOTE — Interval H&P Note (Signed)
History and Physical Interval Note:  03/12/2013 10:42 PM  Kayla [name]  has presented today for surgery, with the diagnosis of disloacted right hip  The various methods of treatment have been discussed with the patient and family. After consideration of risks, benefits and other options for treatment, the patient has consented to  Procedure(s): CLOSED REDUCTION HIP (Right) as a surgical intervention .  [name] history has been reviewed, patient examined, no change in status, stable for surgery.  I have reviewed the patient's chart and labs.  Questions were answered to the patient's satisfaction.     [name]

## 2013-03-12 NOTE — Consult Note (Signed)
Ortho on-call consult  62 yo female who underwent right hip hemiarthroplasty by Dr. Charlann Boxer on 12/9 and has been on rehab unit for a few days now. Was on commode earlier this evening and leaned forward with immediate hip pain and inability to bear weight. She had significant right hip pain. Radiographs demonstrated right hip dislocation. On exam she can move her toes and sensation is intact. Her leg is shortened and internally rotated.  I discussed the situation with the patient and her husband and they have opted for closed reduction. We will do this in the operating room ASAP.

## 2013-03-12 NOTE — Brief Op Note (Signed)
03/08/2013 - 03/12/2013  11:03 PM  PATIENT:  Kayla [name]  62 y.o. female  PRE-OPERATIVE DIAGNOSIS:  disloacted right hip  POST-OPERATIVE DIAGNOSIS:  disloacted right hip  PROCEDURE:  Procedure(s): CLOSED REDUCTION HIP (Right)  SURGEON:  Surgeon(s) and Role:    * [name], MD - Primary  PHYSICIAN ASSISTANT:   ASSISTANTS: none   ANESTHESIA:   general  TOURNIQUET:  * No tourniquets in log *  DICTATION: .Other Dictation: Dictation Number [id number]  PLAN OF CARE: Admit to inpatient   PATIENT DISPOSITION:  PACU - hemodynamically stable.

## 2013-03-12 NOTE — Preoperative (Signed)
Beta Blockers   Reason not to administer Beta Blockers:Not Applicable 

## 2013-03-12 NOTE — Progress Notes (Signed)
Speech Language Pathology Daily Session Note  Patient Details  Name: Kayla [name] MRN: [id number] Date of Birth: Jul 19, 1950  Today's Date: 03/12/2013 Time: 1400-1425 Time Calculation (min): 25 min  Short Term Goals: Week 1: SLP Short Term Goal 1 (Week 1): Pt will utilize safe swallow strategies with Dys 3 textures and thin liquids with Mod I to reduce overt s/s of aspiration SLP Short Term Goal 2 (Week 1): Pt will consume trials of regular textures with Min cues for use of safe swallow strategies to assess readiness for advancement SLP Short Term Goal 3 (Week 1): Pt will demonstrate adequate diaphragmatic breathing for increased vocal intensity at the word level with Min cues SLP Short Term Goal 4 (Week 1): Pt will utilize external memory aids with supervision level cues SLP Short Term Goal 5 (Week 1): Pt will demonstrate complex problem solving with Min cues  Skilled Therapeutic Interventions: Therapeutic intervention for dysphagia and cognitive retraining complete.  Patient required min A verbal cues to follow compensatory strategies to decrease aspiration risk.  She was given trial of regular consistencies (graham cracker) and did well.  No s/s of aspiration noted.  She required supervision/cues to complete simple calculations.  Continue with current treatment plan.    FIM:  Comprehension Comprehension Mode: Auditory Comprehension: 6-Follows complex conversation/direction: With extra time/assistive device Expression Expression Mode: Verbal Expression: 5-Expresses complex 90% of the time/cues < 10% of the time Social Interaction Social Interaction: 6-Interacts appropriately with others with medication or extra time (anti-anxiety, antidepressant). Problem Solving Problem Solving: 6-Solves complex problems: With extra time Memory Memory: 6-Assistive device: No helper FIM - Eating Eating Activity: 5: Supervision/cues;6: Modified consistency diet: (comment)  Pain Pain  Assessment Pain Assessment: No/denies pain Pain Score: 0-No pain Faces Pain Scale: No hurt  Therapy/Group: Individual Therapy  Lenny Pastel 03/12/2013, 4:24 PM

## 2013-03-13 ENCOUNTER — Encounter (HOSPITAL_COMMUNITY): Payer: Self-pay | Admitting: General Practice

## 2013-03-13 MED ORDER — RISPERIDONE 0.25 MG PO TABS
0.2500 mg | ORAL_TABLET | Freq: Every day | ORAL | Status: DC
  Administered 2013-03-13 – 2013-03-14 (×2): 0.25 mg via ORAL
  Filled 2013-03-13 (×4): qty 1

## 2013-03-13 MED ORDER — BACLOFEN 20 MG PO TABS
20.0000 mg | ORAL_TABLET | Freq: Four times a day (QID) | ORAL | Status: DC
  Administered 2013-03-13 – 2013-03-14 (×8): 20 mg via ORAL
  Filled 2013-03-13 (×12): qty 1

## 2013-03-13 MED ORDER — SENNA 8.6 MG PO TABS
2.0000 | ORAL_TABLET | Freq: Every day | ORAL | Status: DC
  Administered 2013-03-13 – 2013-03-14 (×2): 17.2 mg via ORAL
  Filled 2013-03-13 (×4): qty 2

## 2013-03-13 MED ORDER — LEVOTHYROXINE SODIUM 75 MCG PO TABS
75.0000 ug | ORAL_TABLET | Freq: Every day | ORAL | Status: DC
  Administered 2013-03-13 – 2013-03-14 (×2): 75 ug via ORAL
  Filled 2013-03-13 (×4): qty 1

## 2013-03-13 MED ORDER — DOCUSATE SODIUM 100 MG PO CAPS
100.0000 mg | ORAL_CAPSULE | Freq: Two times a day (BID) | ORAL | Status: DC
  Administered 2013-03-13 – 2013-03-14 (×4): 100 mg via ORAL
  Filled 2013-03-13 (×6): qty 1

## 2013-03-13 MED ORDER — HYDROCODONE-ACETAMINOPHEN 5-325 MG PO TABS
1.0000 | ORAL_TABLET | ORAL | Status: DC | PRN
  Administered 2013-03-15: 1 via ORAL
  Filled 2013-03-13: qty 1

## 2013-03-13 MED ORDER — SODIUM CHLORIDE 0.45 % IV SOLN
INTRAVENOUS | Status: DC
  Administered 2013-03-13: 02:00:00 via INTRAVENOUS

## 2013-03-13 MED ORDER — TEMAZEPAM 15 MG PO CAPS
15.0000 mg | ORAL_CAPSULE | Freq: Every evening | ORAL | Status: DC | PRN
  Administered 2013-03-14: 15 mg via ORAL
  Filled 2013-03-13: qty 1

## 2013-03-13 MED ORDER — POLYETHYLENE GLYCOL 3350 17 G PO PACK
17.0000 g | PACK | Freq: Every day | ORAL | Status: DC
  Administered 2013-03-14: 17 g via ORAL
  Filled 2013-03-13 (×3): qty 1

## 2013-03-13 MED ORDER — FERROUS SULFATE 325 (65 FE) MG PO TABS
325.0000 mg | ORAL_TABLET | Freq: Three times a day (TID) | ORAL | Status: DC
  Administered 2013-03-13 – 2013-03-14 (×5): 325 mg via ORAL
  Filled 2013-03-13 (×10): qty 1

## 2013-03-13 MED ORDER — POTASSIUM CHLORIDE CRYS ER 20 MEQ PO TBCR
20.0000 meq | EXTENDED_RELEASE_TABLET | Freq: Every day | ORAL | Status: DC
  Administered 2013-03-13 – 2013-03-14 (×2): 20 meq via ORAL
  Filled 2013-03-13 (×3): qty 1

## 2013-03-13 MED ORDER — FUROSEMIDE 40 MG PO TABS
40.0000 mg | ORAL_TABLET | Freq: Every day | ORAL | Status: DC
  Administered 2013-03-13 – 2013-03-14 (×2): 40 mg via ORAL
  Filled 2013-03-13 (×3): qty 1

## 2013-03-13 MED ORDER — SODIUM CHLORIDE 0.9 % IJ SOLN
10.0000 mL | INTRAMUSCULAR | Status: DC | PRN
  Administered 2013-03-13: 20 mL
  Administered 2013-03-15 (×2): 30 mL

## 2013-03-13 MED ORDER — ACETAMINOPHEN 325 MG PO TABS
650.0000 mg | ORAL_TABLET | Freq: Four times a day (QID) | ORAL | Status: DC | PRN
  Administered 2013-03-13 – 2013-03-14 (×3): 650 mg via ORAL
  Filled 2013-03-13 (×3): qty 2

## 2013-03-13 MED ORDER — VITAMIN B-12 1000 MCG PO TABS
1000.0000 ug | ORAL_TABLET | Freq: Every day | ORAL | Status: DC
  Administered 2013-03-13 – 2013-03-14 (×2): 1000 ug via ORAL
  Filled 2013-03-13 (×3): qty 1

## 2013-03-13 MED ORDER — MOMETASONE FURO-FORMOTEROL FUM 100-5 MCG/ACT IN AERO
2.0000 | INHALATION_SPRAY | Freq: Two times a day (BID) | RESPIRATORY_TRACT | Status: DC
  Administered 2013-03-13 – 2013-03-14 (×4): 2 via RESPIRATORY_TRACT
  Filled 2013-03-13: qty 8.8

## 2013-03-13 MED ORDER — GABAPENTIN 600 MG PO TABS
600.0000 mg | ORAL_TABLET | Freq: Two times a day (BID) | ORAL | Status: DC
  Administered 2013-03-13 – 2013-03-14 (×4): 600 mg via ORAL
  Filled 2013-03-13 (×7): qty 1

## 2013-03-13 MED ORDER — ALBUTEROL SULFATE (5 MG/ML) 0.5% IN NEBU: 2.5000 mg | INHALATION_SOLUTION | RESPIRATORY_TRACT | Status: DC | PRN

## 2013-03-13 MED ORDER — ALUM & MAG HYDROXIDE-SIMETH 200-200-20 MG/5ML PO SUSP: 30.0000 mL | ORAL | Status: DC | PRN

## 2013-03-13 MED ORDER — ASPIRIN EC 325 MG PO TBEC
325.0000 mg | DELAYED_RELEASE_TABLET | Freq: Every day | ORAL | Status: DC
  Administered 2013-03-13 – 2013-03-14 (×2): 325 mg via ORAL
  Filled 2013-03-13 (×3): qty 1

## 2013-03-13 MED ORDER — FLUOXETINE HCL 20 MG PO CAPS
40.0000 mg | ORAL_CAPSULE | Freq: Every day | ORAL | Status: DC
  Administered 2013-03-13 – 2013-03-14 (×2): 40 mg via ORAL
  Filled 2013-03-13 (×3): qty 2

## 2013-03-13 MED ORDER — ENOXAPARIN SODIUM 40 MG/0.4ML ~~LOC~~ SOLN
40.0000 mg | SUBCUTANEOUS | Status: DC
  Administered 2013-03-14: 40 mg via SUBCUTANEOUS
  Filled 2013-03-13 (×3): qty 0.4

## 2013-03-13 MED ORDER — CALCIUM CARBONATE ANTACID 500 MG PO CHEW
1.0000 | CHEWABLE_TABLET | Freq: Four times a day (QID) | ORAL | Status: DC | PRN
  Filled 2013-03-13: qty 1

## 2013-03-13 MED ORDER — MORPHINE SULFATE 2 MG/ML IJ SOLN
1.0000 mg | INTRAMUSCULAR | Status: DC | PRN
  Administered 2013-03-13: 1 mg via INTRAVENOUS
  Filled 2013-03-13: qty 1

## 2013-03-13 MED ORDER — FESOTERODINE FUMARATE ER 4 MG PO TB24
4.0000 mg | ORAL_TABLET | Freq: Every day | ORAL | Status: DC
  Administered 2013-03-13 – 2013-03-14 (×2): 4 mg via ORAL
  Filled 2013-03-13 (×3): qty 1

## 2013-03-13 MED ORDER — PANTOPRAZOLE SODIUM 40 MG PO TBEC
40.0000 mg | DELAYED_RELEASE_TABLET | Freq: Two times a day (BID) | ORAL | Status: DC
  Administered 2013-03-13 – 2013-03-14 (×4): 40 mg via ORAL
  Filled 2013-03-13 (×3): qty 1

## 2013-03-13 NOTE — Op Note (Signed)
Kayla [name], Kayla [name] NO.:  [id number]  MEDICAL RECORD NO.:  [id number]  LOCATION:  [id number]                        FACILITY:  [facility]  PHYSICIAN:  Ollen Gross, M.D.    DATE OF BIRTH:  [birth year]  DATE OF PROCEDURE:  03/12/2013 DATE OF DISCHARGE:  03/12/2013                              OPERATIVE REPORT   PREOPERATIVE DIAGNOSIS:  Right hip dislocation.  POSTOPERATIVE DIAGNOSIS:  Right hip dislocation.  PROCEDURE:  Closed reduction, right hip dislocation.  SURGEON:  Ollen Gross, M.D.  ASSISTANT:  None.  ANESTHESIA:  General.  COMPLICATIONS:  None.  CONDITION:  Stable to recovery.  BRIEF CLINICAL NOTE:  [name] is a 62 year old female, who had a right hip hemiarthroplasty done by Dr. Charlann Boxer for a fracture on December 9.  She has been in inpatient rehab at [facility] and was doing well.  Earlier this evening, was on the commode and leaned forward, subsequently sustaining a posterior hip dislocation.  She presents now for closed reduction.  PROCEDURE IN DETAIL:  After successful administration of general anesthetic, a time-out was performed and then traction was applied in 90:90 position to her right leg.  There was a palpable and audible reduction of the hip.  Once the hip was reduced, I was able to flex it to 90, rotate it about 30 in each direction, and abduct 40, and the hip remained stable.  The leg lengths were equal.  She was placed into a knee immobilizer, well padded.  AP pelvis shows concentric reduction of the hip.  She was subsequently awakened and transported to Recovery in stable condition.     Ollen Gross, M.D.     FA/MEDQ  D:  03/12/2013  T:  03/13/2013  Job:  [id number]

## 2013-03-13 NOTE — Progress Notes (Signed)
Subjective:     Patient reports pain as 2 on 0-10 scale.No post-op problem this morning.    Objective: Vital signs in last 24 hours: Temp:  [98.1 F (36.7 C)-98.6 F (37 C)] 98.6 F (37 C) (12/21 0500) Pulse Rate:  [66-86] 66 (12/21 0500) Resp:  [12-20] 20 (12/21 0500) BP: (89-112)/(44-58) 112/50 mmHg (12/21 0500) SpO2:  [98 %-100 %] 99 % (12/21 0500) Weight:  [63.2 kg (139 lb 5.3 oz)] 63.2 kg (139 lb 5.3 oz) (12/21 0045)  Intake/Output from previous day: 12/20 0701 - 12/21 0700 In: -  Out: 700 [Urine:700] Intake/Output this shift: Total I/O In: 20 [I.V.:20] Out: 350 [Urine:350]  No results found for this basename: HGB,  in the last 72 hours No results found for this basename: WBC, RBC, HCT, PLT,  in the last 72 hours No results found for this basename: NA, K, CL, CO2, BUN, CREATININE, GLUCOSE, CALCIUM,  in the last 72 hours No results found for this basename: LABPT, INR,  in the last 72 hours  Compartment soft  Assessment/Plan:     Up with therapy  Kayla [name] A 03/13/2013, 9:52 AM

## 2013-03-13 NOTE — Progress Notes (Signed)
Patient admitted to 5N13, vitals stable. Oxygen at 2 L [place]. Denies pain. Orders received from [place], [place]. Will continue to monitor patient.  Olevia Perches

## 2013-03-14 ENCOUNTER — Inpatient Hospital Stay (HOSPITAL_COMMUNITY): Payer: 59

## 2013-03-14 ENCOUNTER — Encounter (HOSPITAL_COMMUNITY): Payer: 59

## 2013-03-14 ENCOUNTER — Observation Stay (HOSPITAL_COMMUNITY): Payer: 59 | Admitting: Physical Therapy

## 2013-03-14 DIAGNOSIS — R0902 Hypoxemia: Secondary | ICD-10-CM | POA: Diagnosis present

## 2013-03-14 NOTE — Evaluation (Signed)
Physical Therapy Evaluation Patient Details Name: Kayla [name] MRN: [id number] DOB: 03/06/1951 Today's Date: 03/14/2013 Time: 1308-6578 PT Time Calculation (min): 22 min  PT Assessment / Plan / Recommendation History of Present Illness  Pt is a 62 y/o female admitted s/p closed reduction of right hip after hemiarthroplasty dislocation. Pt currently in CIR and has a PMH of MS.  Clinical Impression  This patient presents with acute pain and decreased functional independence following the above mentioned procedure. At the time of PT eval, pt required significant assist for functional mobility, as she was trying to maintain NWB status until clarification orders were received for appropriate weight bearing. Pt has been in CIR prior to dislocation, and is appropriate to return to CIR for continued therapy.      PT Assessment  Patient needs continued PT services    Follow Up Recommendations  CIR    Does the patient have the potential to tolerate intense rehabilitation      Barriers to Discharge Decreased caregiver support      Equipment Recommendations  Rolling walker with 5" wheels    Recommendations for Other Services Rehab consult   Frequency Min 5X/week    Precautions / Restrictions Precautions Precautions: Fall;Posterior Hip Precaution Booklet Issued: No Precaution Comments: Discussed posterior precautions Required Braces or Orthoses: Knee Immobilizer - Right Knee Immobilizer - Right: On at all times (Except in therapy) Restrictions Weight Bearing Restrictions: No RLE Weight Bearing: Non weight bearing Other Position/Activity Restrictions: Pt kept NWB throughout session as there was no WB status in chart, and MD unable to be reached. While ambulating, pt unable to maintain NWB status due to decreased strength, and in these times pt was kept TDWB. Will maintain these restrictions until clarification is received from MD.    Pertinent Vitals/Pain Pt reports increased pain  during ambulation, however was repositioned for comfort. Pain not rated on 0-10 scale.       Mobility  Bed Mobility Bed Mobility: Sit to Supine Sit to Supine: 3: Mod assist;HOB flat;With rail Details for Bed Mobility Assistance: VC's for sequencing and technique. Assist for elevation of LE's into bed. Transfers Transfers: Sit to Stand;Stand to Sit Sit to Stand: 3: Mod assist;From chair/3-in-1;With upper extremity assist Stand to Sit: 3: Mod assist;To bed;With upper extremity assist Details for Transfer Assistance: VC's for hand placement and to maintain hip precautions.  Ambulation/Gait Ambulation/Gait Assistance: 3: Mod assist Ambulation Distance (Feet): 5 Feet Assistive device: Rolling walker Ambulation/Gait Assistance Details: VC's for sequencing and technique. Pt able to maintain TDWB status, as there were no WB status orders written in chart. Will maintain TDWB until clarification is received.  Gait Pattern: Step-to pattern;Decreased stride length;Narrow base of support Gait velocity: decreased    Exercises     PT Diagnosis: Difficulty walking;Generalized weakness;Acute pain  PT Problem List: Decreased strength;Decreased range of motion;Decreased activity tolerance;Decreased balance;Decreased mobility;Decreased knowledge of use of DME;Decreased safety awareness;Decreased knowledge of precautions;Pain PT Treatment Interventions: DME instruction;Stair training;Gait training;Functional mobility training;Therapeutic activities;Therapeutic exercise;Neuromuscular re-education;Patient/family education     PT Goals(Current goals can be found in the care plan section) Acute Rehab PT Goals Patient Stated Goal: none stated.  agreeable to ot./pt PT Goal Formulation: With patient/family Time For Goal Achievement: 03/21/13 Potential to Achieve Goals: Good  Visit Information  Last PT Received On: 03/14/13 Assistance Needed: +2 History of Present Illness: Pt is a 62 y/o female admitted  s/p closed reduction of right hip after hemiarthroplasty dislocation. Pt currently in CIR and has a PMH of  MS.       Prior Functioning  Home Living Family/patient expects to be discharged to:: Inpatient rehab Living Arrangements: Spouse/significant other Available Help at Discharge: Family;Friend(s);Available 24 hours/day Type of Home: House Home Access: Stairs to enter [employer] of Steps: 4 Entrance Stairs-Rails: Right;Left Home Layout: Two level;Able to live on main level with bedroom/bathroom Alternate Level Stairs-Number of Steps: 12 Alternate Level Stairs-Rails: Can reach both;Left;Right  Lives With: Spouse Prior Function Level of Independence: Independent with assistive device(s) Comments: Cane occasionally due to MS relapse Communication Communication: No difficulties Dominant Hand: Right    Cognition  Cognition Arousal/Alertness: Awake/alert Behavior During Therapy: WFL for tasks assessed/performed Overall Cognitive Status: Within Functional Limits for tasks assessed    Extremity/Trunk Assessment Upper Extremity Assessment Upper Extremity Assessment: Defer to OT evaluation Lower Extremity Assessment Lower Extremity Assessment: RLE deficits/detail;LLE deficits/detail RLE Deficits / Details: Decreased strength and AROM consistent with above mentioned procedure.  LLE Deficits / Details: Generalized weakness Cervical / Trunk Assessment Cervical / Trunk Assessment: Kyphotic   Balance Balance Balance Assessed: Yes Static Sitting Balance Static Sitting - Balance Support: No upper extremity supported;Feet supported Static Sitting - Level of Assistance: 5: Stand by assistance Static Standing Balance Static Standing - Balance Support: Bilateral upper extremity supported Static Standing - Level of Assistance: 3: Mod assist  End of Session PT - End of Session Equipment Utilized During Treatment: Gait belt;Right knee immobilizer Activity Tolerance: Patient  limited by fatigue;Patient limited by pain Patient left: in bed;with call bell/phone within reach;with family/visitor present Nurse Communication: Mobility status  GP Functional Assessment Tool Used: Clinical judgement Functional Limitation: Mobility: Walking and moving around Mobility: Walking and Moving Around Current Status [phone]): At least 40 percent but less than 60 percent impaired, limited or restricted Mobility: Walking and Moving Around Goal Status [phone]): At least 40 percent but less than 60 percent impaired, limited or restricted   Ruthann Cancer 03/14/2013, 4:45 PM  Ruthann Cancer, PT, DPT [phone]

## 2013-03-14 NOTE — Progress Notes (Signed)
Subjective: Pt with no hip related complaints today   Objective: Vital signs in last 24 hours: Temp:  [97.6 F (36.4 C)-99.1 F (37.3 C)] 97.6 F (36.4 C) (12/22 0500) Pulse Rate:  [61-84] 61 (12/22 0500) Resp:  [18-20] 20 (12/22 0500) BP: (90-116)/(48-62) 111/62 mmHg (12/22 0500) SpO2:  [96 %-100 %] 98 % (12/22 0500)  Intake/Output from previous day: 12/21 0701 - 12/22 0700 In: 1755.8 [P.O.:480; I.V.:1275.8] Out: 1500 [Urine:1500] Intake/Output this shift:    No results found for this basename: HGB,  in the last 72 hours No results found for this basename: WBC, RBC, HCT, PLT,  in the last 72 hours No results found for this basename: NA, K, CL, CO2, BUN, CREATININE, GLUCOSE, CALCIUM,  in the last 72 hours No results found for this basename: LABPT, INR,  in the last 72 hours  No pain on attempted hip motion. Leg lengths equal and no deformity noted  Assessment/Plan: Right hip dislocation- Doing fine. Patient needs to go back to CIR TODAY as there is no reason that she needs an inpatient admission other than for her rehab. I am unsure why she did not return to CIR post-op given that we are in the same physical facility with the same available services. She can resume her previous activity level at rehab and wear the knee immobilizer at all times except when in therapy.    [name] 03/14/2013, 7:33 AM

## 2013-03-14 NOTE — Evaluation (Signed)
Occupational Therapy Evaluation Patient Details Name: Kayla [name] MRN: [id number] DOB: 12/27/1950 Today's Date: 03/14/2013 Time: 1191-4782 OT Time Calculation (min): 26 min  OT Assessment / Plan / Recommendation History of present illness Pt is a 62 y/o female admitted s/p closed reduction of right hip after hemiarthroplasty dislocation. Pt has PMH of MS.   Clinical Impression   Pt presents with below problem list. Pt will benefit from acute OT to increase independence prior to d/c. Attempted to maintain NWB status on RLE until further clarification (called MD doctor office and waited for page from MD). Recommend CIR for additional rehab prior to d/c home.     OT Assessment  Patient needs continued OT Services    Follow Up Recommendations  CIR    Barriers to Discharge      Equipment Recommendations  Other (comment) (defer to next venue)    Recommendations for Other Services    Frequency  Min 2X/week    Precautions / Restrictions Precautions Precautions: Fall;Posterior Hip Precaution Booklet Issued: No Precaution Comments: Educated on precautions. Required Braces or Orthoses: Knee Immobilizer - Right Knee Immobilizer - Right: On at all times;Other (comment) (except in therapy) Restrictions Weight Bearing Restrictions: Yes RLE Weight Bearing: Non weight bearing Other Position/Activity Restrictions: Pt kept NWB throughout session as there was no WB status in chart, and MD unable to be reached. While ambulating, pt having difficulty maintaining NWB status due to decreased strength. Will maintain these restrictions until clarification is received from MD.    Pertinent Vitals/Pain Pain in hip. Increased activity. Repositioned.     ADL  Eating/Feeding: Supervision/safety Where Assessed - Eating/Feeding: Chair Grooming: Set up Where Assessed - Grooming: Supported sitting Upper Body Bathing: Set up Where Assessed - Upper Body Bathing: Supported sitting Lower Body Bathing: +2  Total assistance Lower Body Bathing: Patient Percentage: 60% Where Assessed - Lower Body Bathing: Supported sit to stand Upper Body Dressing: Set up Where Assessed - Upper Body Dressing: Supported sitting Lower Body Dressing: +2 Total assistance Lower Body Dressing: Patient Percentage: 60% Where Assessed - Lower Body Dressing: Supported sit to Pharmacist, hospital: +2 Total assistance Toilet Transfer: Patient Percentage: 50% (60% for sit<>stand) Toilet Transfer Method: Sit to stand;Stand pivot Acupuncturist: Materials engineer and Hygiene: +2 Total assistance Toileting - Clothing Manipulation and Hygiene: Patient Percentage: 50% Where Assessed - Toileting Clothing Manipulation and Hygiene: Sit to stand from 3-in-1 or toilet;Sit on 3-in-1 or toilet Tub/Shower Transfer Method: Not assessed Equipment Used: Knee Immobilizer;Rolling walker;Gait belt Transfers/Ambulation Related to ADLs: +2 total A ADL Comments: Pt states she is comfortable with reacher and sockaid for LB ADLs. Educated on dressing technique. Performed stand pivot from bed to [facility].  Educated on energy conservation as well as on toilet aid for hygiene and avoiding breaking precautions.    OT Diagnosis: Acute pain  OT Problem List: Decreased strength;Decreased range of motion;Decreased activity tolerance;Impaired balance (sitting and/or standing);Decreased knowledge of use of DME or AE;Decreased knowledge of precautions;Pain OT Treatment Interventions: Self-care/ADL training;DME and/or AE instruction;Therapeutic activities;Patient/family education;Balance training   OT Goals(Current goals can be found in the care plan section) Acute Rehab OT Goals Patient Stated Goal: not stated OT Goal Formulation: With patient Time For Goal Achievement: 03/21/13 Potential to Achieve Goals: Good ADL Goals Pt Will Perform Lower Body Bathing: with min assist;with adaptive equipment;sit to/from  stand Pt Will Perform Lower Body Dressing: with adaptive equipment;sit to/from stand;with min assist Pt Will Transfer to Toilet: with min assist;ambulating;bedside  commode Pt Will Perform Toileting - Clothing Manipulation and hygiene: with min assist;sit to/from stand Additional ADL Goal #1: Pt will independently verbalize and demonstrate 3/3 hip precautions.  Additional ADL Goal #2: Pt will perform bed mobility at Mod I level as precursor for ADLs.    Visit Information  Last OT Received On: 03/14/13 Assistance Needed: +2 History of Present Illness: Pt is a 62 y/o female admitted s/p closed reduction of right hip after hemiarthroplasty dislocation. Pt has PMH of MS.       Prior Functioning     Home Living Family/patient expects to be discharged to:: Inpatient rehab Living Arrangements: Spouse/significant other Available Help at Discharge: Family;Friend(s);Available 24 hours/day Type of Home: House Home Access: Stairs to enter [employer] of Steps: 4 Entrance Stairs-Rails: Right;Left Home Layout: Two level;Able to live on main level with bedroom/bathroom Alternate Level Stairs-Number of Steps: 12 Alternate Level Stairs-Rails: Can reach both;Left;Right  Lives With: Spouse Prior Function Level of Independence: Independent with assistive device(s) Comments: Cane occasionally due to MS relapse Communication Communication: No difficulties Dominant Hand: Right         Vision/Perception     Cognition  Cognition Arousal/Alertness: Awake/alert Behavior During Therapy: WFL for tasks assessed/performed Overall Cognitive Status: Within Functional Limits for tasks assessed    Extremity/Trunk Assessment Upper Extremity Assessment Upper Extremity Assessment: Overall WFL for tasks assessed Lower Extremity Assessment Lower Extremity Assessment: Defer to PT evaluation RLE Deficits / Details: Decreased strength and AROM consistent with above mentioned procedure.  LLE  Deficits / Details: Generalized weakness     Mobility Bed Mobility Bed Mobility: Sit to Supine;Supine to Sit;Scooting to HOB Supine to Sit: 3: Mod assist Sit to Supine: 2: Max assist Details for Bed Mobility Assistance: Assist to get LE's back into bed.  Assisted with LE's when going to sitting position as well as with trunk. Transfers Transfers: Sit to Stand;Stand to Sit Sit to Stand: 1: +2 Total assist;From bed;From chair/3-in-1 Sit to Stand: Patient Percentage: 60% Stand to Sit: 1: +2 Total assist;To bed;To chair/3-in-1 Stand to Sit: Patient Percentage: 60% Details for Transfer Assistance: Second person assisted with holding up RLE and helping prevent internal rotation. Cues for technique and precautions.  +2 Total A (50%) for stand pivot transfer.  Pt did much better for sit to stand from [facility] versus from bed.      Exercise        End of Session OT - End of Session Equipment Utilized During Treatment: Gait belt;Rolling walker;Right knee immobilizer Activity Tolerance: Patient tolerated treatment well Patient left: in bed;with call bell/phone within reach;with family/visitor present Nurse Communication: Weight bearing status  GO Functional Assessment Tool Used: clinical judgment Functional Limitation: Self care Self Care Current Status (Z6109): At least 60 percent but less than 80 percent impaired, limited or restricted Self Care Goal Status (U0454): At least 20 percent but less than 40 percent impaired, limited or restricted   Earlie Raveling OTR/L [phone] 03/14/2013, 5:05 PM

## 2013-03-14 NOTE — H&P (Signed)
Physical Medicine and Rehabilitation Admission H&P    CC: Left femur fracture with Hip hemi dislocation. Recent MS exacerbation.   HPI:  Kayla [name] is a 62 y.o. 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 Surgical intervention recommended by Dr. Olin but on 02/24/13 she developed dyspnea and hypoxia with intermittent stridor and was transferred to ICU. Dr. Gore consulted for input and laryngoscopy with stable bilateral true vocal fold paresis, narrow~2mm glottic airway anteriorly----ok for intubation. She developed fever on 12/05 due to pseudomonas UTI as well as suspicion of aspiration PNA and was treated broad antibiotic coverage. Neurology consulted for input on worsening of weakness as family reported increased fatigue and weakness lower and upper extremities prior to admission with recommendation to start steroid dose pack. She was treated with IV solumedrol X three days per Dr. Camilo's input. Has completed 10 day course of Levaquin today--procalcitonin levels <0.10.   Once stabilized, she underwent right hip hemi by Dr. Olin on 03/01/13 and placed on lovenox for DVT prophylaxis. She was extubated without difficulty and MBS done post op to evaluate swallow function. MBS done revealed moderate pharyngeal and cervical esophageal dysphagia and patient placed on D2, thin liquids. She has had issues with pain and lethargy but is showing some improvement in participation with therapy. Continues to have increased oxygen needs with activity. CXR with left base atelectasis rather than consolidation and pulmonary toilet recommended by CCM. As mentation improving, diet advanced to D3, thin with compensatory strategies. Urinary retention treated with foley.  She was  admitted to CIR on 03/08/13 and has continued to require oxygen due to hypoxia. She was treated with 2 units PRBC for symptomatic anemia with stabilization in BP and improvement in endurance levels. Foley was discontinued and bladder program was initiated. Patient has been unable to void requiring qid in and out caths. Urine culture done showed no growth. Pain control has been reasonable and patient has had good motivation and has been making progress with therapy. On 03/12/13 pm, patient dislocated her hip while on commode and was taken to OR for  Closed reduction by Dr. Alusio.  Foley was replaced due to ongoing retention. Patient with high levels of anxiety regarding activity. Patient admitted back to CIR to complete her rehab program.    Review of Systems  Constitutional: Negative for fever and chills.  HENT: Negative for ear pain and hearing loss.   Eyes: Negative for double vision and pain.  Respiratory: Positive for wheezing.   Cardiovascular: Negative for chest pain.  Gastrointestinal: Negative for heartburn, nausea, vomiting and abdominal pain.  Musculoskeletal: Positive for joint pain.  Neurological: Negative for dizziness, tingling, tremors, sensory change, speech change and headaches.  Psychiatric/Behavioral: Negative for depression.    Past Medical History  Diagnosis Date  . Multiple sclerosis   . Unspecified hypothyroidism   . Other voice and resonance disorders   . Other diseases of vocal cords   . Cervical cancer 2005   Past Surgical History  Procedure Laterality Date  . Vesicovaginal fistula closure w/ tah    . Hip arthroplasty Right 03/01/2013    Procedure: ARTHROPLASTY BIPOLAR HIP;  Surgeon: Matthew D Olin, MD;  Location: [facility] ORS;  Service: Orthopedics;  Laterality: Right;   Family History  Problem Relation Age of Onset  .   Colon cancer Father    Social History:  Married. Independent with walker PTA. Has supportive family.She reports that she has never smoked. She has  never used smokeless tobacco. She reports that she drinks alcohol. She reports that she does not use illicit drugs.   Allergies  Allergen Reactions  . Methylprednisolone Other (See Comments)    Pt states cannot take any steroids b/c they cause a decrease in her heart rate.  . Morphine And Related     Excessive sedation.   . Oxycodone-Acetaminophen     REACTION: unspecified  . Oxycodone-Acetaminophen     Rash head to toe  . Percocet [Oxycodone-Acetaminophen]     hallucinations  . Phenytoin     REACTION: rash  . Zanaflex [Tizanidine Hydrochloride]   . Phenytoin Sodium Extended Rash    Medications Prior to Admission  Medication Sig Dispense Refill  . acetaminophen (TYLENOL) 325 MG tablet Take 2 tablets (650 mg total) by mouth every 6 (six) hours as needed for mild pain (or Fever >/= 101).      . albuterol (PROVENTIL) (5 MG/ML) 0.5% nebulizer solution Take 0.5 mLs (2.5 mg total) by nebulization every 2 (two) hours as needed for wheezing or shortness of breath.  20 mL  12  . alum & mag hydroxide-simeth (MAALOX/MYLANTA) 200-200-20 MG/5ML suspension Take 30 mLs by mouth every 4 (four) hours as needed for indigestion or heartburn.  355 mL  0  . baclofen (LIORESAL) 20 MG tablet Take 20 mg by mouth. 1 1/2 tabs four times a day       . calcium carbonate (TUMS - DOSED IN MG ELEMENTAL CALCIUM) 500 MG chewable tablet Chew 1 tablet (200 mg of elemental calcium total) by mouth every 6 (six) hours as needed for indigestion or heartburn.      . dalfampridine (AMPYRA) 10 MG TB12 Take 1 tablet (10 mg total) by mouth 2 (two) times daily.  60 tablet    . docusate sodium 100 MG CAPS Take 100 mg by mouth 2 (two) times daily.  10 capsule  0  . enoxaparin (LOVENOX) 40 MG/0.4ML injection Inject 0.4 mLs (40 mg total) into the skin daily.  0 Syringe    . feeding supplement, RESOURCE BREEZE, (RESOURCE BREEZE) LIQD Take 1 Container by mouth 3 (three) times daily with meals.    0  . ferrous sulfate 325 (65 FE) MG  tablet Take 1 tablet (325 mg total) by mouth 3 (three) times daily after meals.    3  . FLUoxetine (PROZAC) 40 MG capsule Take 40 mg by mouth daily.        . furosemide (LASIX) 40 MG tablet Take 1 tablet (40 mg total) by mouth daily.  30 tablet    . gabapentin (NEURONTIN) 600 MG tablet Take 600 mg by mouth 2 (two) times daily.      . guaiFENesin-codeine 100-10 MG/5ML syrup Take 5 mLs by mouth every 12 (twelve) hours as needed for cough.  120 mL  0  . HYDROcodone-acetaminophen (NORCO) 7.5-325 MG per tablet Take 1-2 tablets by mouth every 4 (four) hours as needed for moderate pain.  100 tablet  0  . levothyroxine (SYNTHROID) 75 MCG tablet Take 1 tablet (75 mcg total) by mouth daily.  30 tablet  11  . mometasone-formoterol (DULERA) 100-5 MCG/ACT AERO Inhale 2 puffs into the lungs 2 (two) times daily.      . pantoprazole (PROTONIX) 40 MG tablet Take 1 tablet (40 mg total) by mouth 2 (two) times daily.      .   polyethylene glycol (MIRALAX / GLYCOLAX) packet Take 17 g by mouth daily.  14 each  0  . potassium chloride SA (K-DUR,KLOR-CON) 20 MEQ tablet Take 1 tablet (20 mEq total) by mouth daily.      . risperiDONE (RISPERDAL) 0.25 MG tablet Take 1 tablet (0.25 mg total) by mouth at bedtime.      . senna (SENOKOT) 8.6 MG TABS tablet Take 2 tablets (17.2 mg total) by mouth at bedtime.  120 each  0  . temazepam (RESTORIL) 15 MG capsule Take 15 mg by mouth daily as needed.        . vitamin B-12 1000 MCG tablet Take 1 tablet (1,000 mcg total) by mouth daily.      . sodium chloride 0.9 % injection 10-40 mLs by Intracatheter route every 12 (twelve) hours.  5 mL      Home: Home Living Family/patient expects to be discharged to:: Inpatient rehab Living Arrangements: Spouse/significant other Available Help at Discharge: Family;Friend(s);Available 24 hours/day Type of Home: House Home Access: Stairs to enter Entrance Stairs-Number of Steps: 4 Entrance Stairs-Rails: Right;Left Home Layout: Two level;Able to  live on main level with bedroom/bathroom Alternate Level Stairs-Number of Steps: 12 Alternate Level Stairs-Rails: Can reach both;Left;Right  Lives With: Spouse   Functional History: Prior Function Comments: Cane occasionally due to MS relapse  Functional Status:  Mobility: Bed Mobility Bed Mobility: Sit to Supine;Supine to Sit;Scooting to HOB Supine to Sit: 3: Mod assist Sit to Supine: 2: Max assist Transfers Transfers: Sit to Stand;Stand to Sit Sit to Stand: 1: +2 Total assist;From bed;From chair/3-in-1 Sit to Stand: Patient Percentage: 60% Stand to Sit: 1: +2 Total assist;To bed;To chair/3-in-1 Stand to Sit: Patient Percentage: 60% Ambulation/Gait Ambulation/Gait Assistance: 3: Mod assist Ambulation Distance (Feet): 5 Feet Assistive device: Rolling walker Ambulation/Gait Assistance Details: VC's for sequencing and technique. Pt able to maintain TDWB status, as there were no WB status orders written in chart. Will maintain TDWB until clarification is received.  Gait Pattern: Step-to pattern;Decreased stride length;Narrow base of support Gait velocity: decreased    ADL: ADL Eating/Feeding: Supervision/safety Where Assessed - Eating/Feeding: Chair Grooming: Set up Where Assessed - Grooming: Supported sitting Upper Body Bathing: Set up Where Assessed - Upper Body Bathing: Supported sitting Lower Body Bathing: +2 Total assistance Where Assessed - Lower Body Bathing: Supported sit to stand Upper Body Dressing: Set up Where Assessed - Upper Body Dressing: Supported sitting Lower Body Dressing: +2 Total assistance Where Assessed - Lower Body Dressing: Supported sit to stand Toilet Transfer: +2 Total assistance Toilet Transfer Method: Sit to stand;Stand pivot Toilet Transfer Equipment: Bedside commode Tub/Shower Transfer Method: Not assessed Equipment Used: Knee Immobilizer;Rolling walker;Gait belt Transfers/Ambulation Related to ADLs: +2 total A ADL Comments: Pt states she  is comfortable with reacher and sockaid for LB ADLs. Educated on dressing technique. Performed stand pivot from bed to BSC.  Educated on energy conservation as well as on toilet aid for hygiene and avoiding breaking precautions.  Cognition: Cognition Overall Cognitive Status: Within Functional Limits for tasks assessed Orientation Level: Oriented X4 Cognition Arousal/Alertness: Awake/alert Behavior During Therapy: WFL for tasks assessed/performed Overall Cognitive Status: Within Functional Limits for tasks assessed  Physical Exam: Blood pressure 106/57, pulse 49, temperature 98.7 F (37.1 C), temperature source Oral, resp. rate 19, height 5' 3" (1.6 m), weight 63.2 kg (139 lb 5.3 oz), SpO2 99.00%. Physical Exam  Nursing note and vitals reviewed. Constitutional: She is oriented to person, place, and time. She appears well-developed and well-nourished. She   has a sickly appearance. Nasal cannula in place.  HENT:  Head: Normocephalic and atraumatic.  Eyes: Pupils are equal, round, and reactive to light.  Neck: Neck supple.  Cardiovascular: Normal rate and regular rhythm.   Respiratory: Effort normal. She has wheezes (occasional, more upper airway than anything else).  GI: Soft. Bowel sounds are normal. She exhibits no distension. There is no tenderness. There is no rebound and no guarding.  Musculoskeletal:  Right hip wound is dressed. She has KI on to sustain right leg position. Right leg is appropriately tender.   Neurological: She is alert and oriented to person, place, and time. Coordination abnormal.  Upper ext 5/5 bilaterally. Left lower 3+ HF to 4- KE, to 4 ankle. RLE is 1+ HF, 2 KE, 3/5 ADF, 3+ APF. No gross sensory deficits were appreciated.   Skin: Skin is warm and dry.  Psychiatric: She has a normal mood and affect. Her behavior is normal. Judgment and thought content normal.    No results found for this or any previous visit (from the past 48 hour(s)). No results  found.  Post Admission Physician Evaluation: 1. Functional deficits secondary  to right FNF s/p hip hemi, with subsequent dislocation. 2. Patient is admitted to receive collaborative, interdisciplinary care between the physiatrist, rehab nursing staff, and therapy team. 3. Patient's level of medical complexity and substantial therapy needs in context of that medical necessity cannot be provided at a lesser intensity of care such as a SNF. 4. Patient has experienced substantial functional loss from his/her baseline which was documented above under the "Functional History" and "Functional Status" headings.  Judging by the patient's diagnosis, physical exam, and functional history, the patient has potential for functional progress which will result in measurable gains while on inpatient rehab.  These gains will be of substantial and practical use upon discharge  in facilitating mobility and self-care at the household level. 5. Physiatrist will provide 24 hour management of medical needs as well as oversight of the therapy plan/treatment and provide guidance as appropriate regarding the interaction of the two. 6. 24 hour rehab nursing will assist with bladder management, bowel management, safety, skin/wound care, disease management, medication administration, pain management and patient education  and help integrate therapy concepts, techniques,education, etc. 7. PT will assess and treat for/with: Lower extremity strength, range of motion, stamina, balance, functional mobility, safety, adaptive techniques and equipment, pain mgt, hip precautions, family/care giver education.   Goals are: supervision to minimal assist. 8. OT will assess and treat for/with: ADL's, functional mobility, safety, upper extremity strength, adaptive techniques and equipment, hip precautions, pain mgt, education.   Goals are: supervision to min assist. 9. SLP will assess and treat for/with: n/a.  Goals are: n/a. 10. Case Management  and Social Worker will assess and treat for psychological issues and discharge planning. 11. Team conference will be held weekly to assess progress toward goals and to determine barriers to discharge. 12. Patient will receive at least 3 hours of therapy per day at least 5 days per week. 13. ELOS: 17-22 days       14. Prognosis:  good   Medical Problem List and Plan: 1. DVT Prophylaxis/Anticoagulation: Pharmaceutical: Lovenox 2. Pain Management:  Prn tylenol and/or baclofen.   -will try to stay with tylenol for general pain control.  -can try 1/2 to 1 tramadol at night for more severe pain and sleep. 3. Mood:  Provide ego support for high levels of anxiety. Educate on hip precautions.  4. Neuropsych: This   patient is capable of making decisions on her own behalf. 5.  MS with neurogenic bladder:    Recheck UA/UCS.      -IC cath prn. Encourage OOB to toilet or commode to void. Follow for pattern 6. Hypoxia: Aspiration PNA has been treated.              -Encourage IS with flutter valve.             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             -continued oxygen via [employer]---need to make sure oxymetry readings are accurate  8. Leukocytosis: resolving. Recheck in am.   9. ABLA:  Has improved post transfusion.   Autry Droege T. Dequante Tremaine, MD, FAAPMR [facility]   03/15/2013 

## 2013-03-14 NOTE — Progress Notes (Signed)
Rehab admissions - Patient known to me from previous admission to acute inpatient rehab.  Noted patient went off rehab with hip dislocation.  I spoke with [facility] on site reviewer.  We will need PT and OT evaluations and then will need to get authorization for re admit to acute inpatient rehab.  Awaiting PT and OT re evaluations today.  Call me for questions.  #[phone]

## 2013-03-15 ENCOUNTER — Inpatient Hospital Stay (HOSPITAL_COMMUNITY)
Admission: RE | Admit: 2013-03-15 | Discharge: 2013-03-31 | DRG: 945 | Disposition: A | Discharge status: Home Health | Payer: 59 | Source: Intra-hospital | Attending: Physical Medicine & Rehabilitation | Admitting: Physical Medicine & Rehabilitation

## 2013-03-15 ENCOUNTER — Encounter (HOSPITAL_COMMUNITY): Payer: Self-pay | Admitting: *Deleted

## 2013-03-15 DIAGNOSIS — F411 Generalized anxiety disorder: Secondary | ICD-10-CM | POA: Diagnosis present

## 2013-03-15 DIAGNOSIS — K5900 Constipation, unspecified: Secondary | ICD-10-CM | POA: Diagnosis present

## 2013-03-15 DIAGNOSIS — C539 Malignant neoplasm of cervix uteri, unspecified: Secondary | ICD-10-CM | POA: Diagnosis present

## 2013-03-15 DIAGNOSIS — S72009A Fracture of unspecified part of neck of unspecified femur, initial encounter for closed fracture: Secondary | ICD-10-CM

## 2013-03-15 DIAGNOSIS — W19XXXA Unspecified fall, initial encounter: Secondary | ICD-10-CM

## 2013-03-15 DIAGNOSIS — R0902 Hypoxemia: Secondary | ICD-10-CM | POA: Diagnosis present

## 2013-03-15 DIAGNOSIS — N319 Neuromuscular dysfunction of bladder, unspecified: Secondary | ICD-10-CM | POA: Diagnosis present

## 2013-03-15 DIAGNOSIS — S73004A Unspecified dislocation of right hip, initial encounter: Secondary | ICD-10-CM

## 2013-03-15 DIAGNOSIS — R339 Retention of urine, unspecified: Secondary | ICD-10-CM | POA: Diagnosis present

## 2013-03-15 DIAGNOSIS — Z96649 Presence of unspecified artificial hip joint: Secondary | ICD-10-CM

## 2013-03-15 DIAGNOSIS — G35 Multiple sclerosis: Secondary | ICD-10-CM

## 2013-03-15 DIAGNOSIS — J3800 Paralysis of vocal cords and larynx, unspecified: Secondary | ICD-10-CM | POA: Diagnosis present

## 2013-03-15 DIAGNOSIS — T84029D Dislocation of unspecified internal joint prosthesis, subsequent encounter: Secondary | ICD-10-CM

## 2013-03-15 DIAGNOSIS — J189 Pneumonia, unspecified organism: Secondary | ICD-10-CM

## 2013-03-15 DIAGNOSIS — Y92009 Unspecified place in unspecified non-institutional (private) residence as the place of occurrence of the external cause: Secondary | ICD-10-CM

## 2013-03-15 DIAGNOSIS — S73006A Unspecified dislocation of unspecified hip, initial encounter: Secondary | ICD-10-CM

## 2013-03-15 DIAGNOSIS — R0609 Other forms of dyspnea: Secondary | ICD-10-CM | POA: Diagnosis present

## 2013-03-15 DIAGNOSIS — Z5189 Encounter for other specified aftercare: Principal | ICD-10-CM

## 2013-03-15 DIAGNOSIS — R0600 Dyspnea, unspecified: Secondary | ICD-10-CM | POA: Diagnosis present

## 2013-03-15 DIAGNOSIS — E039 Hypothyroidism, unspecified: Secondary | ICD-10-CM | POA: Diagnosis present

## 2013-03-15 LAB — URINALYSIS, ROUTINE W REFLEX MICROSCOPIC
Bilirubin Urine: NEGATIVE
Glucose, UA: NEGATIVE mg/dL
Hgb urine dipstick: NEGATIVE
Ketones, ur: NEGATIVE mg/dL
Leukocytes, UA: NEGATIVE
Nitrite: NEGATIVE
Protein, ur: NEGATIVE mg/dL
Specific Gravity, Urine: 1.008 (ref 1.005–1.030)
Urobilinogen, UA: 0.2 mg/dL (ref 0.0–1.0)
pH: 6.5 (ref 5.0–8.0)

## 2013-03-15 MED ORDER — PROCHLORPERAZINE MALEATE 5 MG PO TABS
5.0000 mg | ORAL_TABLET | Freq: Four times a day (QID) | ORAL | Status: DC | PRN
  Filled 2013-03-15: qty 2

## 2013-03-15 MED ORDER — ENOXAPARIN SODIUM 40 MG/0.4ML ~~LOC~~ SOLN
40.0000 mg | SUBCUTANEOUS | Status: DC
  Administered 2013-03-15 – 2013-03-30 (×15): 40 mg via SUBCUTANEOUS
  Filled 2013-03-15 (×17): qty 0.4

## 2013-03-15 MED ORDER — ALBUTEROL SULFATE (5 MG/ML) 0.5% IN NEBU
2.5000 mg | INHALATION_SOLUTION | RESPIRATORY_TRACT | Status: DC | PRN
  Administered 2013-03-20: 2.5 mg via RESPIRATORY_TRACT
  Filled 2013-03-15: qty 0.5

## 2013-03-15 MED ORDER — ALUM & MAG HYDROXIDE-SIMETH 200-200-20 MG/5ML PO SUSP: 30.0000 mL | ORAL | Status: DC | PRN

## 2013-03-15 MED ORDER — ASPIRIN EC 325 MG PO TBEC
325.0000 mg | DELAYED_RELEASE_TABLET | Freq: Every day | ORAL | Status: DC
  Administered 2013-03-16 – 2013-03-31 (×16): 325 mg via ORAL
  Filled 2013-03-15 (×18): qty 1

## 2013-03-15 MED ORDER — DIPHENHYDRAMINE HCL 12.5 MG/5ML PO ELIX
12.5000 mg | ORAL_SOLUTION | Freq: Four times a day (QID) | ORAL | Status: DC | PRN
  Administered 2013-03-18 – 2013-03-29 (×8): 25 mg via ORAL
  Filled 2013-03-15 (×8): qty 10

## 2013-03-15 MED ORDER — TRAZODONE HCL 50 MG PO TABS
25.0000 mg | ORAL_TABLET | Freq: Every evening | ORAL | Status: DC | PRN
  Administered 2013-03-15 – 2013-03-30 (×9): 50 mg via ORAL
  Filled 2013-03-15 (×9): qty 1

## 2013-03-15 MED ORDER — FUROSEMIDE 40 MG PO TABS
40.0000 mg | ORAL_TABLET | Freq: Every day | ORAL | Status: DC
  Administered 2013-03-16 – 2013-03-26 (×10): 40 mg via ORAL
  Filled 2013-03-15 (×13): qty 1

## 2013-03-15 MED ORDER — GABAPENTIN 600 MG PO TABS
600.0000 mg | ORAL_TABLET | Freq: Two times a day (BID) | ORAL | Status: DC
  Administered 2013-03-15 – 2013-03-31 (×31): 600 mg via ORAL
  Filled 2013-03-15 (×38): qty 1

## 2013-03-15 MED ORDER — POTASSIUM CHLORIDE CRYS ER 20 MEQ PO TBCR
20.0000 meq | EXTENDED_RELEASE_TABLET | Freq: Every day | ORAL | Status: DC
  Administered 2013-03-16 – 2013-03-27 (×12): 20 meq via ORAL
  Filled 2013-03-15 (×14): qty 1

## 2013-03-15 MED ORDER — LIDOCAINE HCL 2 % EX GEL
CUTANEOUS | Status: DC | PRN
  Filled 2013-03-15: qty 5

## 2013-03-15 MED ORDER — GUAIFENESIN-DM 100-10 MG/5ML PO SYRP: 5.0000 mL | ORAL_SOLUTION | Freq: Four times a day (QID) | ORAL | Status: DC | PRN

## 2013-03-15 MED ORDER — SENNA 8.6 MG PO TABS
2.0000 | ORAL_TABLET | Freq: Every day | ORAL | Status: DC
  Administered 2013-03-15: 17.2 mg via ORAL
  Filled 2013-03-15 (×3): qty 2

## 2013-03-15 MED ORDER — PROCHLORPERAZINE 25 MG RE SUPP
12.5000 mg | Freq: Four times a day (QID) | RECTAL | Status: DC | PRN
  Filled 2013-03-15: qty 1

## 2013-03-15 MED ORDER — MOMETASONE FURO-FORMOTEROL FUM 100-5 MCG/ACT IN AERO
2.0000 | INHALATION_SPRAY | Freq: Two times a day (BID) | RESPIRATORY_TRACT | Status: DC
  Administered 2013-03-15 – 2013-03-31 (×29): 2 via RESPIRATORY_TRACT
  Filled 2013-03-15 (×2): qty 8.8

## 2013-03-15 MED ORDER — BISACODYL 10 MG RE SUPP: 10.0000 mg | Freq: Every day | RECTAL | Status: DC | PRN

## 2013-03-15 MED ORDER — TRAMADOL HCL 50 MG PO TABS: 25.0000 mg | ORAL_TABLET | Freq: Two times a day (BID) | ORAL | Status: DC | PRN

## 2013-03-15 MED ORDER — PROCHLORPERAZINE EDISYLATE 5 MG/ML IJ SOLN
5.0000 mg | Freq: Four times a day (QID) | INTRAMUSCULAR | Status: DC | PRN
  Filled 2013-03-15: qty 2

## 2013-03-15 MED ORDER — FLEET ENEMA 7-19 GM/118ML RE ENEM: 1.0000 | ENEMA | Freq: Once | RECTAL | Status: AC | PRN

## 2013-03-15 MED ORDER — FERROUS SULFATE 325 (65 FE) MG PO TABS
325.0000 mg | ORAL_TABLET | Freq: Three times a day (TID) | ORAL | Status: DC
  Administered 2013-03-16 – 2013-03-17 (×4): 325 mg via ORAL
  Filled 2013-03-15 (×7): qty 1

## 2013-03-15 MED ORDER — FLUOXETINE HCL 20 MG PO CAPS
40.0000 mg | ORAL_CAPSULE | Freq: Every day | ORAL | Status: DC
  Administered 2013-03-16 – 2013-03-31 (×16): 40 mg via ORAL
  Filled 2013-03-15 (×19): qty 2

## 2013-03-15 MED ORDER — PANTOPRAZOLE SODIUM 40 MG PO TBEC
40.0000 mg | DELAYED_RELEASE_TABLET | Freq: Two times a day (BID) | ORAL | Status: DC
  Administered 2013-03-15 – 2013-03-31 (×31): 40 mg via ORAL
  Filled 2013-03-15 (×30): qty 1

## 2013-03-15 MED ORDER — LEVOTHYROXINE SODIUM 75 MCG PO TABS
75.0000 ug | ORAL_TABLET | Freq: Every day | ORAL | Status: DC
  Administered 2013-03-16 – 2013-03-31 (×16): 75 ug via ORAL
  Filled 2013-03-15 (×17): qty 1

## 2013-03-15 MED ORDER — CALCIUM CARBONATE ANTACID 500 MG PO CHEW
1.0000 | CHEWABLE_TABLET | Freq: Four times a day (QID) | ORAL | Status: DC | PRN
  Filled 2013-03-15: qty 1

## 2013-03-15 MED ORDER — HYDROCODONE-ACETAMINOPHEN 5-325 MG PO TABS: 0.5000 | ORAL_TABLET | ORAL | Status: DC | PRN

## 2013-03-15 MED ORDER — VITAMIN B-12 1000 MCG PO TABS
1000.0000 ug | ORAL_TABLET | Freq: Every day | ORAL | Status: DC
  Administered 2013-03-16 – 2013-03-31 (×16): 1000 ug via ORAL
  Filled 2013-03-15 (×18): qty 1

## 2013-03-15 MED ORDER — POLYETHYLENE GLYCOL 3350 17 G PO PACK
17.0000 g | PACK | Freq: Two times a day (BID) | ORAL | Status: DC
  Administered 2013-03-15 – 2013-03-16 (×2): 17 g via ORAL
  Filled 2013-03-15 (×5): qty 1

## 2013-03-15 MED ORDER — ACETAMINOPHEN 325 MG PO TABS
325.0000 mg | ORAL_TABLET | ORAL | Status: DC | PRN
  Administered 2013-03-15 – 2013-03-16 (×5): 650 mg via ORAL
  Administered 2013-03-18: 325 mg via ORAL
  Administered 2013-03-18 – 2013-03-28 (×25): 650 mg via ORAL
  Administered 2013-03-28: 325 mg via ORAL
  Administered 2013-03-29 – 2013-03-31 (×5): 650 mg via ORAL
  Filled 2013-03-15 (×27): qty 2
  Filled 2013-03-15 (×2): qty 1
  Filled 2013-03-15 (×6): qty 2

## 2013-03-15 MED ORDER — RISPERIDONE 0.25 MG PO TABS
0.2500 mg | ORAL_TABLET | Freq: Every day | ORAL | Status: DC
  Administered 2013-03-15 – 2013-03-29 (×14): 0.25 mg via ORAL
  Filled 2013-03-15 (×17): qty 1

## 2013-03-15 MED ORDER — BACLOFEN 20 MG PO TABS
20.0000 mg | ORAL_TABLET | Freq: Four times a day (QID) | ORAL | Status: DC
  Administered 2013-03-15 – 2013-03-31 (×60): 20 mg via ORAL
  Filled 2013-03-15 (×66): qty 1

## 2013-03-15 NOTE — Progress Notes (Signed)
Rehab admissions - I have authorization for acute inpatient rehab admission.  Bed available and can admit to inpatient rehab today.  Call me for questions.  #[phone] 

## 2013-03-15 NOTE — H&P (View-Only) (Signed)
Physical Medicine and Rehabilitation Admission H&P    CC: Left femur fracture with Hip hemi dislocation. Recent MS exacerbation.   HPI:  Kayla [name] is a 62 y.o. 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 Surgical intervention recommended by Dr. Charlann Boxer but on 02/24/13 she developed dyspnea and hypoxia with intermittent stridor and was transferred to ICU. Dr. Emeline Darling consulted for input and laryngoscopy with stable bilateral true vocal fold paresis, narrow~13mm glottic airway anteriorly----ok for intubation. She developed fever on 12/05 due to pseudomonas UTI as well as suspicion of aspiration PNA and was treated broad antibiotic coverage. Neurology consulted for input on worsening of weakness as family reported increased fatigue and weakness lower and upper extremities prior to admission with recommendation to start steroid dose pack. She was treated with IV solumedrol X three days per Dr. Delight Ovens input. Has completed 10 day course of Levaquin today--procalcitonin levels <0.10.   Once stabilized, she underwent right hip hemi by Dr. Charlann Boxer on 03/01/13 and placed on lovenox for DVT prophylaxis. She was extubated without difficulty and MBS done post op to evaluate swallow function. MBS done revealed moderate pharyngeal and cervical esophageal dysphagia and patient placed on D2, thin liquids. She has had issues with pain and lethargy but is showing some improvement in participation with therapy. Continues to have increased oxygen needs with activity. CXR with left base atelectasis rather than consolidation and pulmonary toilet recommended by CCM. As mentation improving, diet advanced to D3, thin with compensatory strategies. Urinary retention treated with foley.  She was  admitted to CIR on 03/08/13 and has continued to require oxygen due to hypoxia. She was treated with 2 units PRBC for symptomatic anemia with stabilization in BP and improvement in endurance levels. Foley was discontinued and bladder program was initiated. Patient has been unable to void requiring qid in and out caths. Urine culture done showed no growth. Pain control has been reasonable and patient has had good motivation and has been making progress with therapy. On 03/12/13 pm, patient dislocated her hip while on commode and was taken to OR for  Closed reduction by Dr. Despina Hick.  Foley was replaced due to ongoing retention. Patient with high levels of anxiety regarding activity. Patient admitted back to CIR to complete her rehab program.    Review of Systems  Constitutional: Negative for fever and chills.  HENT: Negative for ear pain and hearing loss.   Eyes: Negative for double vision and pain.  Respiratory: Positive for wheezing.   Cardiovascular: Negative for chest pain.  Gastrointestinal: Negative for heartburn, nausea, vomiting and abdominal pain.  Musculoskeletal: Positive for joint pain.  Neurological: Negative for dizziness, tingling, tremors, sensory change, speech change and headaches.  Psychiatric/Behavioral: Negative for depression.    Past Medical History  Diagnosis Date  . Multiple sclerosis   . Unspecified hypothyroidism   . Other voice and resonance disorders   . Other diseases of vocal cords   . Cervical cancer 2005   Past Surgical History  Procedure Laterality Date  . Vesicovaginal fistula closure w/ tah    . Hip arthroplasty Right 03/01/2013    Procedure: ARTHROPLASTY BIPOLAR HIP;  Surgeon: Shelda Pal, MD;  Location: [facility] ORS;  Service: Orthopedics;  Laterality: Right;   Family History  Problem Relation Age of Onset  .  Colon cancer Father    Social History:  Married. Independent with walker PTA. Has supportive family.She reports that she has never smoked. She has  never used smokeless tobacco. She reports that she drinks alcohol. She reports that she does not use illicit drugs.   Allergies  Allergen Reactions  . Methylprednisolone Other (See Comments)    Pt states cannot take any steroids b/c they cause a decrease in her heart rate.  . Morphine And Related     Excessive sedation.   . Oxycodone-Acetaminophen     REACTION: unspecified  . Oxycodone-Acetaminophen     Rash head to toe  . Percocet [Oxycodone-Acetaminophen]     hallucinations  . Phenytoin     REACTION: rash  . Zanaflex [Tizanidine Hydrochloride]   . Phenytoin Sodium Extended Rash    Medications Prior to Admission  Medication Sig Dispense Refill  . acetaminophen (TYLENOL) 325 MG tablet Take 2 tablets (650 mg total) by mouth every 6 (six) hours as needed for mild pain (or Fever >/= 101).      Marland Kitchen albuterol (PROVENTIL) (5 MG/ML) 0.5% nebulizer solution Take 0.5 mLs (2.5 mg total) by nebulization every 2 (two) hours as needed for wheezing or shortness of breath.  20 mL  12  . alum & mag hydroxide-simeth (MAALOX/MYLANTA) 200-200-20 MG/5ML suspension Take 30 mLs by mouth every 4 (four) hours as needed for indigestion or heartburn.  355 mL  0  . baclofen (LIORESAL) 20 MG tablet Take 20 mg by mouth. 1 1/2 tabs four times a day       . calcium carbonate (TUMS - DOSED IN MG ELEMENTAL CALCIUM) 500 MG chewable tablet Chew 1 tablet (200 mg of elemental calcium total) by mouth every 6 (six) hours as needed for indigestion or heartburn.      . dalfampridine (AMPYRA) 10 MG TB12 Take 1 tablet (10 mg total) by mouth 2 (two) times daily.  60 tablet    . docusate sodium 100 MG CAPS Take 100 mg by mouth 2 (two) times daily.  10 capsule  0  . enoxaparin (LOVENOX) 40 MG/0.4ML injection Inject 0.4 mLs (40 mg total) into the skin daily.  0 Syringe    . feeding supplement, RESOURCE BREEZE, (RESOURCE BREEZE) LIQD Take 1 Container by mouth 3 (three) times daily with meals.    0  . ferrous sulfate 325 (65 FE) MG  tablet Take 1 tablet (325 mg total) by mouth 3 (three) times daily after meals.    3  . FLUoxetine (PROZAC) 40 MG capsule Take 40 mg by mouth daily.        . furosemide (LASIX) 40 MG tablet Take 1 tablet (40 mg total) by mouth daily.  30 tablet    . gabapentin (NEURONTIN) 600 MG tablet Take 600 mg by mouth 2 (two) times daily.      Marland Kitchen guaiFENesin-codeine 100-10 MG/5ML syrup Take 5 mLs by mouth every 12 (twelve) hours as needed for cough.  120 mL  0  . HYDROcodone-acetaminophen (NORCO) 7.5-325 MG per tablet Take 1-2 tablets by mouth every 4 (four) hours as needed for moderate pain.  100 tablet  0  . levothyroxine (SYNTHROID) 75 MCG tablet Take 1 tablet (75 mcg total) by mouth daily.  30 tablet  11  . mometasone-formoterol (DULERA) 100-5 MCG/ACT AERO Inhale 2 puffs into the lungs 2 (two) times daily.      . pantoprazole (PROTONIX) 40 MG tablet Take 1 tablet (40 mg total) by mouth 2 (two) times daily.      Marland Kitchen  polyethylene glycol (MIRALAX / GLYCOLAX) packet Take 17 g by mouth daily.  14 each  0  . potassium chloride SA (K-DUR,KLOR-CON) 20 MEQ tablet Take 1 tablet (20 mEq total) by mouth daily.      . risperiDONE (RISPERDAL) 0.25 MG tablet Take 1 tablet (0.25 mg total) by mouth at bedtime.      . senna (SENOKOT) 8.6 MG TABS tablet Take 2 tablets (17.2 mg total) by mouth at bedtime.  120 each  0  . temazepam (RESTORIL) 15 MG capsule Take 15 mg by mouth daily as needed.        . vitamin B-12 1000 MCG tablet Take 1 tablet (1,000 mcg total) by mouth daily.      . sodium chloride 0.9 % injection 10-40 mLs by Intracatheter route every 12 (twelve) hours.  5 mL      Home: Home Living Family/patient expects to be discharged to:: Inpatient rehab Living Arrangements: Spouse/significant other Available Help at Discharge: Family;Friend(s);Available 24 hours/day Type of Home: House Home Access: Stairs to enter [employer] of Steps: 4 Entrance Stairs-Rails: Right;Left Home Layout: Two level;Able to  live on main level with bedroom/bathroom Alternate Level Stairs-Number of Steps: 12 Alternate Level Stairs-Rails: Can reach both;Left;Right  Lives With: Spouse   Functional History: Prior Function Comments: Cane occasionally due to MS relapse  Functional Status:  Mobility: Bed Mobility Bed Mobility: Sit to Supine;Supine to Sit;Scooting to HOB Supine to Sit: 3: Mod assist Sit to Supine: 2: Max assist Transfers Transfers: Sit to Stand;Stand to Sit Sit to Stand: 1: +2 Total assist;From bed;From chair/3-in-1 Sit to Stand: Patient Percentage: 60% Stand to Sit: 1: +2 Total assist;To bed;To chair/3-in-1 Stand to Sit: Patient Percentage: 60% Ambulation/Gait Ambulation/Gait Assistance: 3: Mod assist Ambulation Distance (Feet): 5 Feet Assistive device: Rolling walker Ambulation/Gait Assistance Details: VC's for sequencing and technique. Pt able to maintain TDWB status, as there were no WB status orders written in chart. Will maintain TDWB until clarification is received.  Gait Pattern: Step-to pattern;Decreased stride length;Narrow base of support Gait velocity: decreased    ADL: ADL Eating/Feeding: Supervision/safety Where Assessed - Eating/Feeding: Chair Grooming: Set up Where Assessed - Grooming: Supported sitting Upper Body Bathing: Set up Where Assessed - Upper Body Bathing: Supported sitting Lower Body Bathing: +2 Total assistance Where Assessed - Lower Body Bathing: Supported sit to stand Upper Body Dressing: Set up Where Assessed - Upper Body Dressing: Supported sitting Lower Body Dressing: +2 Total assistance Where Assessed - Lower Body Dressing: Supported sit to Pharmacist, hospital: +2 Total assistance Toilet Transfer Method: Sit to stand;Stand pivot Acupuncturist: Gaffer Method: Not assessed Equipment Used: Knee Immobilizer;Rolling walker;Gait belt Transfers/Ambulation Related to ADLs: +2 total A ADL Comments: Pt states she  is comfortable with reacher and sockaid for LB ADLs. Educated on dressing technique. Performed stand pivot from bed to [facility].  Educated on energy conservation as well as on toilet aid for hygiene and avoiding breaking precautions.  Cognition: Cognition Overall Cognitive Status: Within Functional Limits for tasks assessed Orientation Level: Oriented X4 Cognition Arousal/Alertness: Awake/alert Behavior During Therapy: WFL for tasks assessed/performed Overall Cognitive Status: Within Functional Limits for tasks assessed  Physical Exam: Blood pressure 106/57, pulse 49, temperature 98.7 F (37.1 C), temperature source Oral, resp. rate 19, height 5\' 3"  (1.6 m), weight 63.2 kg (139 lb 5.3 oz), SpO2 99.00%. Physical Exam  Nursing note and vitals reviewed. Constitutional: She is oriented to person, place, and time. She appears well-developed and well-nourished. She  has a sickly appearance. Nasal cannula in place.  HENT:  Head: Normocephalic and atraumatic.  Eyes: Pupils are equal, round, and reactive to light.  Neck: Neck supple.  Cardiovascular: Normal rate and regular rhythm.   Respiratory: Effort normal. She has wheezes (occasional, more upper airway than anything else).  GI: Soft. Bowel sounds are normal. She exhibits no distension. There is no tenderness. There is no rebound and no guarding.  Musculoskeletal:  Right hip wound is dressed. She has KI on to sustain right leg position. Right leg is appropriately tender.   Neurological: She is alert and oriented to person, place, and time. Coordination abnormal.  Upper ext 5/5 bilaterally. Left lower 3+ HF to 4- KE, to 4 ankle. RLE is 1+ HF, 2 KE, 3/5 ADF, 3+ APF. No gross sensory deficits were appreciated.   Skin: Skin is warm and dry.  Psychiatric: She has a normal mood and affect. Her behavior is normal. Judgment and thought content normal.    No results found for this or any previous visit (from the past 48 hour(s)). No results  found.  Post Admission Physician Evaluation: 1. Functional deficits secondary  to right FNF s/p hip hemi, with subsequent dislocation. 2. Patient is admitted to receive collaborative, interdisciplinary care between the physiatrist, rehab nursing staff, and therapy team. 3. Patient's level of medical complexity and substantial therapy needs in context of that medical necessity cannot be provided at a lesser intensity of care such as a SNF. 4. Patient has experienced substantial functional loss from his/her baseline which was documented above under the "Functional History" and "Functional Status" headings.  Judging by the patient's diagnosis, physical exam, and functional history, the patient has potential for functional progress which will result in measurable gains while on inpatient rehab.  These gains will be of substantial and practical use upon discharge  in facilitating mobility and self-care at the household level. 5. Physiatrist will provide 24 hour management of medical needs as well as oversight of the therapy plan/treatment and provide guidance as appropriate regarding the interaction of the two. 6. 24 hour rehab nursing will assist with bladder management, bowel management, safety, skin/wound care, disease management, medication administration, pain management and patient education  and help integrate therapy concepts, techniques,education, etc. 7. PT will assess and treat for/with: Lower extremity strength, range of motion, stamina, balance, functional mobility, safety, adaptive techniques and equipment, pain mgt, hip precautions, family/care giver education.   Goals are: supervision to minimal assist. 8. OT will assess and treat for/with: ADL's, functional mobility, safety, upper extremity strength, adaptive techniques and equipment, hip precautions, pain mgt, education.   Goals are: supervision to min assist. 9. SLP will assess and treat for/with: n/a.  Goals are: n/a. 10. Case Management  and Social Worker will assess and treat for psychological issues and discharge planning. 11. Team conference will be held weekly to assess progress toward goals and to determine barriers to discharge. 12. Patient will receive at least 3 hours of therapy per day at least 5 days per week. 13. ELOS: 17-22 days       14. Prognosis:  good   Medical Problem List and Plan: 1. DVT Prophylaxis/Anticoagulation: Pharmaceutical: Lovenox 2. Pain Management:  Prn tylenol and/or baclofen.   -will try to stay with tylenol for general pain control.  -can try 1/2 to 1 tramadol at night for more severe pain and sleep. 3. Mood:  Provide ego support for high levels of anxiety. Educate on hip precautions.  4. Neuropsych: This  patient is capable of making decisions on her own behalf. 5.  MS with neurogenic bladder:    Recheck UA/UCS.      -IC cath prn. Encourage OOB to toilet or commode to void. Follow for pattern 6. Hypoxia: Aspiration PNA has been treated.              -Encourage IS with flutter valve.             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             -continued oxygen via [place]---need to make sure oxymetry readings are accurate  8. Leukocytosis: resolving. Recheck in am.   9. ABLA:  Has improved post transfusion.   Ranelle Oyster, MD, [facility] Physical Medicine & Rehabilitation   03/15/2013

## 2013-03-15 NOTE — PMR Pre-admission (Signed)
PMR Admission Coordinator Pre-Admission Assessment  Patient: Kayla [name] is an 62 y.o., female MRN: [id number] DOB: 11-03-1950 Height: 5\' 3"  (160 cm) Weight: 63.2 kg (139 lb 5.3 oz)              Insurance Information HMO:     PPO:       PCP:       IPA:       80/20:       OTHER:  POS plan PRIMARY: [employer]      Policy#: [id number]      Subscriber: Lara Mulch CM Name: Bertram Denver      Phone#: [phone]     Fax#:   Pre-Cert#: [id number]      Employer: Spouse works FT Benefits:  Phone #: [phone]     Name: Talbert Forest. Date: 05/01/12     Deduct: $1000(met)      Out of Pocket Max: $3000(met)      Life Max: unlimited CIR: 80% w/auth      SNF: 80% w/auth  60 days/yr Outpatient: 80%   20 visits PT/OT     Co-Pay: 20% Home Health: 80%  60 visits/yr      Co-Pay: 20% DME: 80%     Co-Pay: 20% Providers: in network  Emergency Contact Information Contact Information   Name Relation Home Work Mobile   Krigbaum,Frank Spouse [phone]    Hardin,Bobbie Relative   [phone]   Dandria, Griego Daughter   [phone]     Current Medical History  Patient Admitting Diagnosis:  R FN fx, MS  History of Present Illness: A 62 y.o. 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 Surgical intervention recommended by Dr. Charlann Boxer but on 02/24/13 she developed dyspnea and hypoxia with intermittent stridor and was transferred to ICU. Dr. Emeline Darling consulted for input and laryngoscopy with stable bilateral true vocal fold paresis, narrow~19mm glottic airway anteriorly----ok for intubation. She developed fever on 12/05 due to pseudomonas UTI as well as suspicion of aspiration PNA and was treated broad antibiotic coverage. Neurology consulted for input on worsening of weakness  as family reported increased fatigue and weakness lower and upper extremities prior to admission with recommendation to start steroid dose pack. She was treated with IV solumedrol X three days per Dr. Delight Ovens input. Has completed 10 day course of Levaquin today--procalcitonin levels <0.10. Once stabilized, she underwent right hip hemi by Dr. Charlann Boxer on 03/01/13 and placed on lovenox for DVT prophylaxis. She was extubated without difficulty and MBS done post op to evaluate swallow function. MBS done revealed moderate pharyngeal and cervical esophageal dysphagia and patient placed on D2, thin liquids. She has had issues with pain and lethargy but is showing some improvement in participation with therapy. Continues to have increased oxygen needs with activity. CXR with left base atelectasis rather than consolidation and pulmonary toilet recommended by CCM. As mentation improving, diet advanced to D3, thin with compensatory strategies. Urinary retention treated with foley.  She was admitted to CIR on 03/08/13 and has continued to require oxygen due to hypoxia. She was treated with 2 units PRBC for symptomatic anemia with stabilization in BP and improvement in endurance levels. Foley was discontinued and bladder program was initiated. Patient has been unable to void requiring qid in and out caths. Urine culture done showed no  growth. Pain control has been reasonable and patient has had good motivation and has been making progress with therapy. On 03/12/13 pm, patient dislocated her hip while on commode and was taken to OR for Closed reduction by Dr. Despina Hick. Foley was replaced due to ongoing retention. Patient with high levels of anxiety regarding activity. PT/OT re-evaluations done 12/22 and recommending inpatient rehab.     Past Medical History  Past Medical History  Diagnosis Date  . Multiple sclerosis   . Unspecified hypothyroidism   . Other voice and resonance disorders   . Other diseases of vocal cords   .  Cervical cancer 2005    Family History  family history includes Colon cancer in her father.  Prior Rehab/Hospitalizations: CIR 03/08/13 until 03/12/13.  Back to acute due to right hip dislocation.  Current Medications  Current facility-administered medications:0.45 % sodium chloride infusion, , Intravenous, Continuous, Lockie Pares M. Bissell, PA-C, Last Rate: 50 mL/hr at 03/15/13 1610;  acetaminophen (TYLENOL) tablet 650 mg, 650 mg, Oral, Q6H PRN, Dayna Barker. Bissell, PA-C, 650 mg at 03/14/13 2330;  albuterol (PROVENTIL) (5 MG/ML) 0.5% nebulizer solution 2.5 mg, 2.5 mg, Nebulization, Q2H PRN, Dayna Barker. Bissell, PA-C alum & mag hydroxide-simeth (MAALOX/MYLANTA) 200-200-20 MG/5ML suspension 30 mL, 30 mL, Oral, Q4H PRN, Dayna Barker. Bissell, PA-C;  aspirin EC tablet 325 mg, 325 mg, Oral, Daily, Jaclyn M. Bissell, PA-C, 325 mg at 03/14/13 1028;  baclofen (LIORESAL) tablet 20 mg, 20 mg, Oral, QID, Jaclyn M. Bissell, PA-C, 20 mg at 03/14/13 2117 calcium carbonate (TUMS - dosed in mg elemental calcium) chewable tablet 200 mg of elemental calcium, 1 tablet, Oral, QID PRN, Dayna Barker. Bissell, PA-C;  docusate sodium (COLACE) capsule 100 mg, 100 mg, Oral, BID, Jaclyn M. Bissell, PA-C, 100 mg at 03/14/13 2117;  enoxaparin (LOVENOX) injection 40 mg, 40 mg, Subcutaneous, Q24H, Jaclyn M. Bissell, PA-C, 40 mg at 03/14/13 1027 ferrous sulfate tablet 325 mg, 325 mg, Oral, TID WC, Dayna Barker. Bissell, PA-C, 325 mg at 03/14/13 1618;  fesoterodine (TOVIAZ) tablet 4 mg, 4 mg, Oral, Daily, Jaclyn M. Bissell, PA-C, 4 mg at 03/14/13 1027;  FLUoxetine (PROZAC) capsule 40 mg, 40 mg, Oral, Daily, Jaclyn M. Bissell, PA-C, 40 mg at 03/14/13 1027;  furosemide (LASIX) tablet 40 mg, 40 mg, Oral, Daily, Jaclyn M. Bissell, PA-C, 40 mg at 03/14/13 1027 gabapentin (NEURONTIN) tablet 600 mg, 600 mg, Oral, BID, Jaclyn M. Bissell, PA-C, 600 mg at 03/14/13 2117;  HYDROcodone-acetaminophen (NORCO/VICODIN) 5-325 MG per tablet 1-2 tablet, 1-2 tablet, Oral,  Q4H PRN, Dayna Barker. Bissell, PA-C, 1 tablet at 03/15/13 0053;  levothyroxine (SYNTHROID, LEVOTHROID) tablet 75 mcg, 75 mcg, Oral, QAC breakfast, Dayna Barker. Bissell, PA-C, 75 mcg at 03/14/13 0809 mometasone-formoterol (DULERA) 100-5 MCG/ACT inhaler 2 puff, 2 puff, Inhalation, BID, Dayna Barker. Bissell, PA-C, 2 puff at 03/14/13 2107;  morphine 2 MG/ML injection 1 mg, 1 mg, Intravenous, Q2H PRN, Dayna Barker. Bissell, PA-C, 1 mg at 03/13/13 0656;  pantoprazole (PROTONIX) EC tablet 40 mg, 40 mg, Oral, BID, Jaclyn M. Bissell, PA-C, 40 mg at 03/14/13 2117 polyethylene glycol (MIRALAX / GLYCOLAX) packet 17 g, 17 g, Oral, Daily, Jaclyn M. Bissell, PA-C, 17 g at 03/14/13 1028;  potassium chloride SA (K-DUR,KLOR-CON) CR tablet 20 mEq, 20 mEq, Oral, Daily, Jaclyn M. Bissell, PA-C, 20 mEq at 03/14/13 1028;  risperiDONE (RISPERDAL) tablet 0.25 mg, 0.25 mg, Oral, QHS, Jaclyn M. Bissell, PA-C, 0.25 mg at 03/14/13 2117 senna (SENOKOT) tablet 17.2 mg, 2 tablet, Oral, QHS, Jaclyn M. Bissell, PA-C, 17.2  mg at 03/14/13 2117;  sodium chloride 0.9 % injection 10-40 mL, 10-40 mL, Intracatheter, PRN, [name], MD, 30 mL at 03/15/13 0920;  temazepam (RESTORIL) capsule 15 mg, 15 mg, Oral, QHS PRN, Dayna Barker. Bissell, PA-C, 15 mg at 03/14/13 2117 vitamin B-12 (CYANOCOBALAMIN) tablet 1,000 mcg, 1,000 mcg, Oral, Daily, Jaclyn M. Bissell, PA-C, 1,000 mcg at 03/14/13 1000  Patients Current Diet: Dysphagia  Precautions / Restrictions Precautions Precautions: Fall;Posterior Hip Precaution Booklet Issued: No Precaution Comments: Educated on precautions. Restrictions Weight Bearing Restrictions: Yes RLE Weight Bearing: Non weight bearing Other Position/Activity Restrictions:  Pt kept NWB throughout session as there was no WB status in chart, and MD unable to be reached. While ambulating, pt unable to maintain NWB status due to decreased strength, and in these times pt was kept TDWB. Will maintain these restrictions until clarification is  received from MD.     Prior Activity Level Community (5-7x/wk): Micah Flesher out daily and drove herself  Journalist, newspaper / Equipment Home Assistive Devices/Equipment: None  Prior Functional Level Prior Function Level of Independence: Independent with assistive device(s) Comments: Cane occasionally due to MS relapse  Current Functional Level Cognition  Overall Cognitive Status: Within Functional Limits for tasks assessed Orientation Level: Oriented X4    Extremity Assessment (includes Sensation/Coordination)          ADLs  Eating/Feeding: Supervision/safety Where Assessed - Eating/Feeding: Chair Grooming: Set up Where Assessed - Grooming: Supported sitting Upper Body Bathing: Set up Where Assessed - Upper Body Bathing: Supported sitting Lower Body Bathing: +2 Total assistance Lower Body Bathing: Patient Percentage: 60% Where Assessed - Lower Body Bathing: Supported sit to stand Upper Body Dressing: Set up Where Assessed - Upper Body Dressing: Supported sitting Lower Body Dressing: +2 Total assistance Lower Body Dressing: Patient Percentage: 60% Where Assessed - Lower Body Dressing: Supported sit to Pharmacist, hospital: +2 Total assistance Toilet Transfer: Patient Percentage: 50% (60% for sit<>stand) Toilet Transfer Method: Sit to stand;Stand pivot Acupuncturist: Materials engineer and Hygiene: +2 Total assistance Toileting - Clothing Manipulation and Hygiene: Patient Percentage: 50% Where Assessed - Toileting Clothing Manipulation and Hygiene: Sit to stand from 3-in-1 or toilet;Sit on 3-in-1 or toilet Tub/Shower Transfer Method: Not assessed Equipment Used: Knee Immobilizer;Rolling walker;Gait belt Transfers/Ambulation Related to ADLs: +2 total A ADL Comments: Pt states she is comfortable with reacher and sockaid for LB ADLs. Educated on dressing technique. Performed stand pivot from bed to [facility].  Educated on energy  conservation as well as on toilet aid for hygiene and avoiding breaking precautions.    Mobility  Bed Mobility: Sit to Supine;Supine to Sit;Scooting to HOB Supine to Sit: 3: Mod assist Sit to Supine: 2: Max assist    Transfers  Transfers: Sit to Stand;Stand to Sit Sit to Stand: 1: +2 Total assist;From bed;From chair/3-in-1 Sit to Stand: Patient Percentage: 60% Stand to Sit: 1: +2 Total assist;To bed;To chair/3-in-1 Stand to Sit: Patient Percentage: 60%    Ambulation / Gait / Stairs / Wheelchair Mobility  Ambulation/Gait Ambulation/Gait Assistance: 3: Mod assist Ambulation Distance (Feet): 5 Feet Assistive device: Rolling walker Ambulation/Gait Assistance Details: VC's for sequencing and technique. Pt able to maintain TDWB status, as there were no WB status orders written in chart. Will maintain TDWB until clarification is received.  Gait Pattern: Step-to pattern;Decreased stride length;Narrow base of support Gait velocity: decreased    Posture / Balance Static Sitting Balance Static Sitting - Balance Support: No upper extremity supported;Feet supported Static  Sitting - Level of Assistance: 5: Stand by assistance Static Standing Balance Static Standing - Balance Support: Bilateral upper extremity supported Static Standing - Level of Assistance: 3: Mod assist    Special needs/care consideration BiPAP/CPAP Has CPAP at home, but has not been using it. CPM No Continuous Drip IV No Dialysis No         Life Vest No Oxygen Has been using 02 2L Center Special Bed No Trach Size No Wound Vac (area) No      Skin Healing right hip incision                         Bowel mgmt: Last BM 03/12/13 Bladder mgmt: Incontinent.  In and out catheterizations being performed. Diabetic mgmt No    Previous Home Environment Living Arrangements: Spouse/significant other  Lives With: Spouse Available Help at Discharge: Family;Friend(s);Available 24 hours/day Type of Home: House Home Layout: Two  level;Able to live on main level with bedroom/bathroom Alternate Level Stairs-Rails: Can reach both;Left;Right Alternate Level Stairs-Number of Steps: 12 Home Access: Stairs to enter Entrance Stairs-Rails: Doctor, general practice of Steps: 4 Bathroom Shower/Tub: Engineer, manufacturing systems: Standard Bathroom Accessibility: Yes How Accessible: Accessible via walker Home Care Services: No  Discharge Living Setting Plans for Discharge Living Setting: Patient's home;House;Lives with (comment) (Lives with husband.) Type of Home at Discharge: House Discharge Home Layout: Two level;Laundry or work area in basement;Able to live on main level with bedroom/bathroom Education officer, environmental and dryer in the basement.) Alternate Level Stairs-Number of Steps: 12 Discharge Home Access: Stairs to enter [employer] of Steps: 3 (Can get a portable ramp if needed.) Does the patient have any problems obtaining your medications?: No  Social/Family/Support Systems Patient Roles: Spouse;Parent (Has a husband and 2 daughters) Contact Information: Jannell Franta - spouse [phone] Anticipated Caregiver: Husband, daughters and sister-in-law Anticipated Caregiver's Contact Information: Gillian Scarce - sister-in-law [phone] Ability/Limitations of Caregiver: Husband works 5 mins away.  Dtr home from [place] on winter break until 01/12.  Has many family and friends to assist Caregiver Availability: 24/7 (Can arrange 24 hr assist if needed.) Discharge Plan Discussed with Primary Caregiver: Yes Is Caregiver In Agreement with Plan?: Yes Does Caregiver/Family have Issues with Lodging/Transportation while Pt is in Rehab?: No  Goals/Additional Needs Patient/Family Goal for Rehab: PT/OT S/Min A goals Expected length of stay: 10 days Cultural Considerations: None Dietary Needs: Dys 3, thin liquids Equipment Needs: TBD Pt/Family Agrees to Admission and willing to participate: Yes Program Orientation  Provided & Reviewed with Pt/Caregiver Including Roles  & Responsibilities: Yes  Decrease burden of Care through IP rehab admission:  N/A  Possible need for SNF placement upon discharge: Not planned  Patient Condition: This patient's medical and functional status has changed since the original consult dated: 03/04/13 in which the Rehabilitation Physician determined and documented that the patient's condition is appropriate for intensive rehabilitative care in an inpatient rehabilitation facility. See "History of Present Illness" (above) for medical update. Functional changes are: Currently requiring mod A to ambulate 5 ft. Patient's medical and functional status update has been discussed with the Rehabilitation physician and patient remains appropriate for inpatient rehabilitation. Will admit to inpatient rehab today.  Preadmission Screen Completed By:  Trish Mage, 03/15/2013 10:33 AM ______________________________________________________________________   Discussed status with Dr. Riley Kill on 03/15/13 at 1132 and received telephone approval for admission today.  Admission Coordinator:  Trish Mage, time1132/Date12/23/14

## 2013-03-15 NOTE — Progress Notes (Signed)
PT Cancellation Note  Patient Details Name: LOA IDLER MRN: [id number] DOB: 05/12/1950   Cancelled Treatment:    Reason Eval/Treat Not Completed: Pt to transfer to CIR today, and no clarification has been received regarding WB status for RLE. Will defer to CIR.   Ruthann Cancer 03/15/2013, 3:43 PM  Ruthann Cancer, PT, DPT [phone]

## 2013-03-15 NOTE — Progress Notes (Signed)
   Subjective: Patient is resting comfortably. Patient seen in rounds for Dr. [name]. Patient is S/P reduction of disloacted hip Plan is to go back to CIR. She has received PT and OT and recommend CIR in both notes.  Objective: Vital signs in last 24 hours: Temp:  [98.4 F (36.9 C)-98.7 F (37.1 C)] 98.7 F (37.1 C) (12/23 0542) Pulse Rate:  [49-72] 49 (12/23 0542) Resp:  [16-19] 19 (12/23 0542) BP: (106-113)/(57-66) 106/57 mmHg (12/23 0542) SpO2:  [98 %-99 %] 99 % (12/23 0542)  Intake/Output from previous day:  Intake/Output Summary (Last 24 hours) at 03/15/13 0942 Last data filed at 03/15/13 1610  Gross per 24 hour  Intake   1705 ml  Output   1625 ml  Net     80 ml     Labs: No results found for this basename: HGB,  in the last 72 hours No results found for this basename: WBC, RBC, HCT, PLT,  in the last 72 hours No results found for this basename: NA, K, CL, CO2, BUN, CREATININE, GLUCOSE, CALCIUM,  in the last 72 hours No results found for this basename: LABPT, INR,  in the last 72 hours  EXAM Extremity - Neurovascular intact Knee immobilizer in place   Past Medical History  Diagnosis Date  . Multiple sclerosis   . Unspecified hypothyroidism   . Other voice and resonance disorders   . Other diseases of vocal cords   . Cervical cancer 2005    Assessment/Plan:     Principal Problem:   Hip dislocation, right Active Problems:   Multiple sclerosis   Fractured femoral neck   Unspecified constipation   Vocal cord paralysis   Urinary retention with incomplete bladder emptying   Acute blood loss anemia   Hypoxia  Estimated body mass index is 24.69 kg/(m^2) as calculated from the following:   Height as of this encounter: 5\' 3"  (1.6 m).   Weight as of this encounter: 63.2 kg (139 lb 5.3 oz). Up with therapy Discharge to CIR today. She can resume her previous activity level at rehab and wear the knee immobilizer at all times except when in therapy.    PERKINS, ALEXZANDREW 03/15/2013, 9:42 AM

## 2013-03-15 NOTE — Progress Notes (Signed)
Rehab admissions - We have called and opened the case requesting acute inpatient rehab admission.  I hope to hear back from [facility] this afternoon.  Hope to re admit to acute inpatient rehab later today.  Call me for questions.  #[phone]

## 2013-03-15 NOTE — Interval H&P Note (Signed)
REE ALCALDE was admitted today to Inpatient Rehabilitation with the diagnosis of right FNF s/p hip hemi and subsequent hip dislocation.  [name] history has been reviewed, patient examined, and there is no change in status.  Patient continues to be appropriate for intensive inpatient rehabilitation.  I have reviewed the patient's chart and labs.  Questions were answered to the patient's satisfaction.  Sherron Mapp T 03/15/2013, 5:54 PM

## 2013-03-15 NOTE — Discharge Summary (Signed)
Physician Discharge Summary  Patient ID: Kayla [name] MRN: [id number] DOB/AGE: March 08, 1951 62 y.o.  Admit date: 03/08/2013 Discharge date: 03/12/13  Discharge Diagnoses:  Principal Problem:   Dislocation of hip prosthesis Active Problems:   Multiple sclerosis   Acute respiratory failure with hypoxia   Femoral neck fracture   Urinary retention with incomplete bladder emptying   Leucocytosis   Acute blood loss anemia   Hip dislocation, left   Hip dislocation, right   Discharged Condition: Stable.   Significant Diagnostic Studies: Dg Chest 2 View  03/06/2013   CLINICAL DATA:  Shortness of breath and cough  EXAM: CHEST  2 VIEW  COMPARISON:  03/02/2013  FINDINGS: Cardiac shadow is within normal limits. The lungs are well aerated bilaterally although increasing left basilar infiltrate is noted. The low right lung remains clear. A right-sided PICC line is again seen and stable.  IMPRESSION: Significant increase in left lower lobe infiltrate when compared with the prior exam.   Electronically Signed   By: Alcide Clever M.D.   On: 03/06/2013 14:25   Dg Hip Portable 1 View Right  03/12/2013   CLINICAL DATA:  Pain.  Recent hip replacement surgery.  EXAM: PORTABLE RIGHT HIP - 1 VIEW  COMPARISON:  03/01/2013  FINDINGS: Posterior dislocation of the femoral head prosthesis. No definite fracture. Extensive dystrophic soft tissue calcifications as before. Multiple vascular clips in the pelvis.  IMPRESSION: Posterior dislocation without definite fracture.   Electronically Signed   By: Oley Balm M.D.   On: 03/12/2013 21:12    Labs:  Basic Metabolic Panel:  Recent Labs Lab 03/09/13 0450  NA 133*  K 3.9  CL 98  CO2 27  GLUCOSE 101*  BUN 12  CREATININE 0.60  CALCIUM 8.1*    CBC:  Recent Labs Lab 03/09/13 0450 03/10/13 0800  WBC 15.7* 13.5*  NEUTROABS 11.9*  --   HGB 7.6* 10.8*  HCT 22.4* 31.3*  MCV 85.5 83.9  PLT 295 276    CBG: No results found for this basename:  GLUCAP,  in the last 168 hours  Brief HPI:   Kayla [name] is a 62 y.o. female with a past medical history significant for MS diagnosed 18 years, JC virus positive currently treated with monthly tysabri infusions, hypothyroidism, cervical cancer, vocal cord paresis, admitted to [facility] 02/23/13 with right displaced femoral neck fracture after a fall at home.  Hospital course complicated by dyspnea with hypoxia requiring transfer to ICU with suspicion of aspiration PNA as well as pseudomonas UTI. Dr. Emeline Darling consulted for input and laryngoscopy with stable bilateral true vocal fold paresis, narrow~30mm glottic airway anteriorly----ok for intubation. Family reported increasing weakness PTA and she was treated with IV solumedrol X three days per Dr. Delight Ovens input. She underwent right hip hemi by Dr. Charlann Boxer on 03/01/13 and placed on lovenox for DVT prophylaxis. She was started on modified diet due to dysphagia and has been advanced to D3, thin. She continued to require oxygen and  CXR with left base atelectasis rather than consolidation therefore pulmonary toilet recommended by CCM. Urinary retention treated with foley placement. Therapy ongoing and CIR was recommended for progression.    Hospital Course: Kayla [name] was admitted to rehab 03/08/2013 for inpatient therapies to consist of PT, ST and OT at least three hours five days a week. Past admission physiatrist, therapy team and rehab RN have worked together to provide customized collaborative inpatient rehab. Wound has been monitored and was healing well without signs or symptoms  of infection. She completed her antibiotics at admission and recheck procalcitonin levels on 12/17 was less than 0.10. Foley was discontinued and she has required in and out caths due to urinary retention. UCS was negative. Pain control was reasonable on tylenol alone. Sedation had resolved but she continued to require oxygen due to hypoxia and deconditioning. She was noted to be  hypotensive and dyspneic and was transfused with 2 units PRBC for symptomatic anemia.   Blood pressures were normalizing and patient was showing improvement in activity tolerance. Speech therapy was working with patient on efficient use of chin tuck and double swallows for dysphagia treatment. She required supervision to min assist for verbal cues to pace self to decrease respiratory needs and prevent fatigue. She was able to transfer with min assist and ambulate 20 feet with RW and min assist. She was min to mod assist for ADL tasks with use of AE. On 03/12/13, patient had acute onset of right hip when past toileting attempts. X rays showed dislocation of hip prosthesis and ortho was contacted for assistance. Patient was taken to OR by Dr. Despina Hick and discharged to acute services.    Disposition:  Orthopedic services.   Diet: D3, thin liquids.       Medication List    ASK your doctor about these medications       acetaminophen 325 MG tablet  Commonly known as:  TYLENOL  Take 2 tablets (650 mg total) by mouth every 6 (six) hours as needed for mild pain (or Fever >/= 101).     albuterol (5 MG/ML) 0.5% nebulizer solution  Commonly known as:  PROVENTIL  Take 0.5 mLs (2.5 mg total) by nebulization every 2 (two) hours as needed for wheezing or shortness of breath.     alum & mag hydroxide-simeth 200-200-20 MG/5ML suspension  Commonly known as:  MAALOX/MYLANTA  Take 30 mLs by mouth every 4 (four) hours as needed for indigestion or heartburn.     baclofen 20 MG tablet  Commonly known as:  LIORESAL  Take 20 mg by mouth. 1 1/2 tabs four times a day     calcium carbonate 500 MG chewable tablet  Commonly known as:  TUMS - dosed in mg elemental calcium  Chew 1 tablet (200 mg of elemental calcium total) by mouth every 6 (six) hours as needed for indigestion or heartburn.     cyanocobalamin 1000 MCG tablet  Take 1 tablet (1,000 mcg total) by mouth daily.     dalfampridine 10 MG Tb12   Commonly known as:  AMPYRA  Take 1 tablet (10 mg total) by mouth 2 (two) times daily.     DSS 100 MG Caps  Take 100 mg by mouth 2 (two) times daily.     enoxaparin 40 MG/0.4ML injection  Commonly known as:  LOVENOX  Inject 0.4 mLs (40 mg total) into the skin daily.     feeding supplement (RESOURCE BREEZE) Liqd  Take 1 Container by mouth 3 (three) times daily with meals.     ferrous sulfate 325 (65 FE) MG tablet  Take 1 tablet (325 mg total) by mouth 3 (three) times daily after meals.     FLUoxetine 40 MG capsule  Commonly known as:  PROZAC  Take 40 mg by mouth daily.     furosemide 40 MG tablet  Commonly known as:  LASIX  Take 1 tablet (40 mg total) by mouth daily.     gabapentin 600 MG tablet  Commonly known as:  NEURONTIN  Take 600 mg by mouth 2 (two) times daily.     guaiFENesin-codeine 100-10 MG/5ML syrup  Take 5 mLs by mouth every 12 (twelve) hours as needed for cough.     HYDROcodone-acetaminophen 7.5-325 MG per tablet  Commonly known as:  NORCO  Take 1-2 tablets by mouth every 4 (four) hours as needed for moderate pain.     levothyroxine 75 MCG tablet  Commonly known as:  SYNTHROID  Take 1 tablet (75 mcg total) by mouth daily.     mometasone-formoterol 100-5 MCG/ACT Aero  Commonly known as:  DULERA  Inhale 2 puffs into the lungs 2 (two) times daily.     pantoprazole 40 MG tablet  Commonly known as:  PROTONIX  Take 1 tablet (40 mg total) by mouth 2 (two) times daily.     polyethylene glycol packet  Commonly known as:  MIRALAX / GLYCOLAX  Take 17 g by mouth daily.     potassium chloride SA 20 MEQ tablet  Commonly known as:  K-DUR,KLOR-CON  Take 1 tablet (20 mEq total) by mouth daily.     RESTORIL 15 MG capsule  Generic drug:  temazepam  Take 15 mg by mouth daily as needed.     risperiDONE 0.25 MG tablet  Commonly known as:  RISPERDAL  Take 1 tablet (0.25 mg total) by mouth at bedtime.     senna 8.6 MG Tabs tablet  Commonly known as:  SENOKOT   Take 2 tablets (17.2 mg total) by mouth at bedtime.     sodium chloride 0.9 % injection  10-40 mLs by Intracatheter route every 12 (twelve) hours.           Follow-up Information   Follow up with Erick Colace, MD.   Specialty:  Physical Medicine and Rehabilitation   Contact information:   [address] [place] [address] [place] [address] [phone]       Follow up with Shelda Pal, MD. Call today. (for follow up appointment in 7-10 days)    Specialty:  Orthopedic Surgery   Contact information:   [address] [place] [address] [phone]       Signed: Jacquelynn Cree 03/15/2013, 3:54 PM

## 2013-03-16 ENCOUNTER — Inpatient Hospital Stay (HOSPITAL_COMMUNITY): Payer: 59 | Admitting: Physical Therapy

## 2013-03-16 ENCOUNTER — Inpatient Hospital Stay (HOSPITAL_COMMUNITY): Payer: 59 | Admitting: Occupational Therapy

## 2013-03-16 DIAGNOSIS — W19XXXA Unspecified fall, initial encounter: Secondary | ICD-10-CM

## 2013-03-16 DIAGNOSIS — S72009A Fracture of unspecified part of neck of unspecified femur, initial encounter for closed fracture: Secondary | ICD-10-CM

## 2013-03-16 DIAGNOSIS — S73006A Unspecified dislocation of unspecified hip, initial encounter: Secondary | ICD-10-CM

## 2013-03-16 LAB — CBC WITH DIFFERENTIAL/PLATELET
Basophils Absolute: 0.1 10*3/uL (ref 0.0–0.1)
Basophils Relative: 1 % (ref 0–1)
Eosinophils Absolute: 0.2 10*3/uL (ref 0.0–0.7)
Eosinophils Relative: 5 % (ref 0–5)
HCT: 27.7 % — ABNORMAL LOW (ref 36.0–46.0)
Hemoglobin: 9.1 g/dL — ABNORMAL LOW (ref 12.0–15.0)
Lymphocytes Relative: 34 % (ref 12–46)
Lymphs Abs: 1.4 10*3/uL (ref 0.7–4.0)
MCH: 28.9 pg (ref 26.0–34.0)
MCHC: 32.9 g/dL (ref 30.0–36.0)
MCV: 87.9 fL (ref 78.0–100.0)
Monocytes Absolute: 0.5 10*3/uL (ref 0.1–1.0)
Monocytes Relative: 12 % (ref 3–12)
Neutro Abs: 1.9 10*3/uL (ref 1.7–7.7)
Neutrophils Relative %: 48 % (ref 43–77)
Platelets: 280 10*3/uL (ref 150–400)
RBC: 3.15 MIL/uL — ABNORMAL LOW (ref 3.87–5.11)
RDW: 14.5 % (ref 11.5–15.5)
WBC: 4 10*3/uL (ref 4.0–10.5)

## 2013-03-16 LAB — COMPREHENSIVE METABOLIC PANEL
ALT: 29 U/L (ref 0–35)
AST: 36 U/L (ref 0–37)
Albumin: 2.2 g/dL — ABNORMAL LOW (ref 3.5–5.2)
Alkaline Phosphatase: 81 U/L (ref 39–117)
BUN: 7 mg/dL (ref 6–23)
CO2: 28 mEq/L (ref 19–32)
Calcium: 8.5 mg/dL (ref 8.4–10.5)
Chloride: 103 mEq/L (ref 96–112)
Creatinine, Ser: 0.57 mg/dL (ref 0.50–1.10)
GFR calc Af Amer: >90 mL/min (ref >90)
GFR calc non Af Amer: >90 mL/min (ref >90)
Glucose, Bld: 81 mg/dL (ref 70–99)
Potassium: 3.7 mEq/L (ref 3.5–5.1)
Sodium: 138 mEq/L (ref 135–145)
Total Bilirubin: 0.2 mg/dL — ABNORMAL LOW (ref 0.3–1.2)
Total Protein: 5 g/dL — ABNORMAL LOW (ref 6.0–8.3)

## 2013-03-16 MED ORDER — TRAMADOL HCL 50 MG PO TABS: 50.0000 mg | ORAL_TABLET | Freq: Two times a day (BID) | ORAL | Status: DC | PRN

## 2013-03-16 MED ORDER — PRO-STAT SUGAR FREE PO LIQD
30.0000 mL | Freq: Three times a day (TID) | ORAL | Status: DC
  Administered 2013-03-16: 30 mL via ORAL
  Filled 2013-03-16 (×3): qty 30

## 2013-03-16 MED ORDER — SENNA 8.6 MG PO TABS
2.0000 | ORAL_TABLET | Freq: Two times a day (BID) | ORAL | Status: DC
  Administered 2013-03-16 – 2013-03-17 (×2): 17.2 mg via ORAL
  Filled 2013-03-16 (×3): qty 2

## 2013-03-16 MED ORDER — BENEPROTEIN PO POWD
1.0000 | Freq: Three times a day (TID) | ORAL | Status: DC
  Administered 2013-03-17 – 2013-03-26 (×12): 6 g via ORAL
  Filled 2013-03-16: qty 227

## 2013-03-16 MED ORDER — BACLOFEN 10 MG PO TABS
10.0000 mg | ORAL_TABLET | Freq: Every evening | ORAL | Status: DC | PRN
  Filled 2013-03-16: qty 1

## 2013-03-16 NOTE — Progress Notes (Signed)
Social Work Patient ID: Kayla [name], female   DOB: [birth year], 62 y.o.   MRN: [id number] Met with pt and family who were in the room to inform team conference goals-supervision/mod/i level and discharge 1/8. She feels pretty good today with her therapies.  Daughter will be here until goes back to college to assist.  Will continue to work  On discharge plans.

## 2013-03-16 NOTE — Patient Care Conference (Signed)
Inpatient RehabilitationTeam Conference and Plan of Care Update Date: 03/16/2013   Time: 10;45 AM    Patient Name: Kayla [name]      Medical Record Number: [id number]  Date of Birth: 08-14-1950 Sex: Female         Room/Bed: 4W05C/4W05C-01 Payor Info: Payor: Advertising copywriter / Plan: Advertising copywriter / Product Type: *No Product type* /    Admitting Diagnosis: HIP FX  Admit Date/Time:  03/15/2013  5:38 PM Admission Comments: No comment available   Primary Diagnosis:  <principal problem not specified> Principal Problem: <principal problem not specified>  Patient Active Problem List   Diagnosis Date Noted  . S/P hip hemiarthroplasty 03/15/2013  . Hypoxia 03/14/2013  . Dislocation of hip prosthesis 03/12/2013  . Hip dislocation, left 03/12/2013  . Hip dislocation, right 03/12/2013  . Acute blood loss anemia 03/11/2013  . Urinary retention with incomplete bladder emptying 03/09/2013  . Leucocytosis 03/09/2013  . Dysphagia, unspecified(787.20) 03/09/2013  . Femoral neck fracture 03/08/2013  . CAP (community acquired pneumonia) 02/26/2013  . Unspecified constipation 02/26/2013  . HCAP (healthcare-associated pneumonia) 02/26/2013  . Acute diastolic heart failure 02/26/2013  . Vocal cord paralysis 02/26/2013  . Acute respiratory failure with hypoxia 02/24/2013  . Pain 02/24/2013  . Fractured femoral neck 02/23/2013  . Hip fracture 02/23/2013  . Excessive flatus 10/21/2012  . Cold feet 07/29/2012  . Hyponatremia 05/20/2012  . Calf pain 11/17/2011  . Back pain 11/27/2010  . DYSPNEA 12/06/2007  . OSTEOPENIA 09/17/2007  . HYPOTHYROIDISM 04/01/2007  . Multiple sclerosis 04/01/2007  . Other diseases of vocal cords 04/01/2007    Expected Discharge Date: Expected Discharge Date: 03/31/13  Team Members Present: Physician leading conference: Dr. Faith Rogue Social Worker Present: Dossie Der, LCSW Nurse Present: Other (comment) Doree Fudge Rosero-RN) PT Present: Clydene Laming,  PT;Other (comment) Anson Fret) OT Present: Edwin Cap, OT SLP Present: Maxcine Ham, SLP     Current Status/Progress Goal Weekly Team Focus  Medical   femur fx s/p hemi and dislocation, MS  maximize medical issues to increase functionality  pain control, sleep, hip precautions   Bowel/Bladder   Pt unable to void. Scanned for > 999 cathed for 1550   Pt able to void on her own      Swallow/Nutrition/ Hydration   Dys. 3 textures with thin liquids, Min A   Mod I  increase utilization of swallowing compensatory strategies, trials of regular textures    ADL's     New eval        Mobility   supervision with w/c mobility, max A with transfers, min A with gait; stairs not yet assessed  mod I with bed mobility, transfers, and w/c mobility; supervision with gait; min A with stair negotiation  endurance, gait/transfer training, adherence to hip precautions with functional mobility, initiation of stair training   Communication   Supervision  Mod I   diaphragmatic breathing at the sentence level    Safety/Cognition/ Behavioral Observations  BSE only today, will assess cognition next session,suspect at baseline   No unsafe behaviors      Pain   Complaining of pain to R leg rating a 7  Pain management to keep pain < 3      Skin   Surgical dressing to R hip  No breakdown         *See Care Plan and progress notes for long and short-term goals.  Barriers to Discharge: cognition, ortho precaution adherence    Possible Resolutions to Barriers:  education,  supervision    Discharge Planning/Teaching Needs:    Home with daughter to assist until goes back to college.  Neighbor to assist also.  Pt glad to be back on rehab     Team Discussion:  Anxiety an issues this time.  Evaluated today-goals set at supervision level.  Speech eval swallowing and work ing this  Revisions to Treatment Plan:  New eval   Continued Need for Acute Rehabilitation Level of Care: The patient requires  daily medical management by a physician with specialized training in physical medicine and rehabilitation for the following conditions: Daily direction of a multidisciplinary physical rehabilitation program to ensure safe treatment while eliciting the highest outcome that is of practical value to the patient.: Yes Daily medical management of patient stability for increased activity during participation in an intensive rehabilitation regime.: Yes Daily analysis of laboratory values and/or radiology reports with any subsequent need for medication adjustment of medical intervention for : Neurological problems;Post surgical problems;Other  Lucy Chris 03/16/2013, 11:39 AM

## 2013-03-16 NOTE — Progress Notes (Signed)
Occupational Therapy Assessment and Plan & Session Notes  Patient Details  Name: Kayla [name] MRN: [id number] Date of Birth: [birth year]  OT Diagnosis: acute pain and muscle weakness (generalized) Rehab Potential: Rehab Potential: Excellent ELOS: 14-18 days   Today's Date: 03/16/2013  Problem List:  Patient Active Problem List   Diagnosis Date Noted  . S/P hip hemiarthroplasty 03/15/2013  . Hypoxia 03/14/2013  . Dislocation of hip prosthesis 03/12/2013  . Hip dislocation, left 03/12/2013  . Hip dislocation, right 03/12/2013  . Acute blood loss anemia 03/11/2013  . Urinary retention with incomplete bladder emptying 03/09/2013  . Leucocytosis 03/09/2013  . Dysphagia, unspecified(787.20) 03/09/2013  . Femoral neck fracture 03/08/2013  . CAP (community acquired pneumonia) 02/26/2013  . Unspecified constipation 02/26/2013  . HCAP (healthcare-associated pneumonia) 02/26/2013  . Acute diastolic heart failure 02/26/2013  . Vocal cord paralysis 02/26/2013  . Acute respiratory failure with hypoxia 02/24/2013  . Pain 02/24/2013  . Fractured femoral neck 02/23/2013  . Hip fracture 02/23/2013  . Excessive flatus 10/21/2012  . Cold feet 07/29/2012  . Hyponatremia 05/20/2012  . Calf pain 11/17/2011  . Back pain 11/27/2010  . DYSPNEA 12/06/2007  . OSTEOPENIA 09/17/2007  . HYPOTHYROIDISM 04/01/2007  . Multiple sclerosis 04/01/2007  . Other diseases of vocal cords 04/01/2007    Past Medical History:  Past Medical History  Diagnosis Date  . Multiple sclerosis   . Unspecified hypothyroidism   . Other voice and resonance disorders   . Other diseases of vocal cords   . Cervical cancer 2005   Past Surgical History:  Past Surgical History  Procedure Laterality Date  . Vesicovaginal fistula closure w/ tah    . Hip arthroplasty Right 03/01/2013    Procedure: ARTHROPLASTY BIPOLAR HIP;  Surgeon: Shelda Pal, MD;  Location: [facility];  Service: Orthopedics;  Laterality: Right;  . Hip  closed reduction Right 03/12/2013    Procedure: CLOSED REDUCTION HIP;  Surgeon: [name], MD;  Location: [facility] OR;  Service: Orthopedics;  Laterality: Right;   Clinical Impression:Kayla [name] is a 62 y.o. 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 Surgical intervention recommended by Dr. Charlann Boxer but on 02/24/13 she developed dyspnea and hypoxia with intermittent stridor and was transferred to ICU. Dr. Emeline Darling consulted for input and laryngoscopy with stable bilateral true vocal fold paresis, narrow~20mm glottic airway anteriorly----ok for intubation. She developed fever on 12/05 due to pseudomonas UTI as well as suspicion of aspiration PNA and was treated broad antibiotic coverage. Neurology consulted for input on worsening of weakness as family reported increased fatigue and weakness lower and upper extremities prior to admission with recommendation to start steroid dose pack. She was treated with IV solumedrol X three days per Dr. Delight Ovens input. Has completed 10 day course of Levaquin today--procalcitonin levels <0.10.   Once stabilized, she underwent right hip hemi by Dr. Charlann Boxer on 03/01/13 and placed on lovenox for DVT prophylaxis. She was extubated without difficulty and MBS done post op to evaluate swallow function. MBS done revealed moderate pharyngeal and cervical esophageal dysphagia and patient placed on D2, thin liquids. She has had issues with pain and lethargy but is showing some improvement in participation with therapy. Continues to have increased oxygen needs with activity. CXR with left base atelectasis rather than consolidation and pulmonary  toilet recommended by CCM. As mentation improving, diet advanced to D3, thin with compensatory strategies. Urinary  retention treated with foley.   She was admitted to CIR on 03/08/13 and has continued to require oxygen due to hypoxia. She was treated with 2 units PRBC for symptomatic anemia with stabilization in BP and improvement in endurance levels. Foley was discontinued and bladder program was initiated. Patient has been unable to void requiring qid in and out caths. Urine culture done showed no growth. Pain control has been reasonable and patient has had good motivation and has been making progress with therapy. On 03/12/13 pm, patient dislocated her hip while on commode and was taken to OR for Closed reduction by Dr. Despina Hick. Foley was replaced due to ongoing retention. Patient with high levels of anxiety regarding activity. Patient admitted back to CIR to complete her rehab program. Patient transferred to CIR on 03/15/2013 .    Patient currently requires min>total assist with basic self-care skills and IADL secondary to muscle weakness and muscle joint tightness, decreased cardiorespiratoy endurance and decreased oxygen support and decreased sitting balance, decreased standing balance, decreased postural control, decreased balance strategies and difficulty maintaining precautions.  Prior to hospitalization, patient could complete ADLs and IADLs independently per her report. Patient did state she would use a SPC for community mobility.   Patient will benefit from skilled intervention to increase independence with basic self-care skills prior to discharge home with care partner.  Anticipate patient will require 24 hour supervision and follow up home health.  OT - End of Session Activity Tolerance: Tolerates 10 - 20 min activity with multiple rests Endurance Deficit: Yes Endurance Deficit Description: Patient with general fatigue and decreased 02 support. Patient on 2 liters of 02 during therapy sessions at this time.  OT Assessment Rehab Potential: Excellent Barriers to Discharge:  (none known at this time) OT  Patient demonstrates impairments in the following area(s): Balance;Edema;Motor;Endurance;Pain;Perception;Safety;Sensory;Skin Integrity OT Basic ADL's Functional Problem(s): Grooming;Bathing;Dressing;Toileting OT Advanced ADL's Functional Problem(s): Simple Meal Preparation;Light Housekeeping OT Transfers Functional Problem(s): Tub/Shower;Toilet OT Additional Impairment(s): None (can benefit from BUE strengthening) OT Plan OT Intensity: Minimum of 1-2 x/day, 45 to 90 minutes OT Frequency: 5 out of 7 days OT Duration/Estimated Length of Stay: 14-18 days OT Treatment/Interventions: Metallurgist training;Community reintegration;Discharge planning;DME/adaptive equipment instruction;Pain management;Patient/family education;Self Care/advanced ADL retraining;Therapeutic Activities;Therapeutic Exercise;UE/LE Strength taining/ROM;UE/LE Coordination activities;Functional mobility training;Psychosocial support;Splinting/orthotics;Skin care/wound managment;Wheelchair propulsion/positioning OT Self Feeding Anticipated Outcome(s): independent (currently at this level) OT Basic Self-Care Anticipated Outcome(s): supervision OT Toileting Anticipated Outcome(s): supervision OT Bathroom Transfers Anticipated Outcome(s): supervision OT Recommendation Patient destination: Home Follow Up Recommendations: Home health OT Equipment Recommended: Tub/shower bench;3 in 1 bedside comode   Precautions/Restrictions  Precautions Precautions: Fall;Posterior Hip;Other (comment) Precaution Booklet Issued: No Precaution Comments: Pt able to independently verbalize all posterior hip precautions. Required Braces or Orthoses: Knee Immobilizer - Right Knee Immobilizer - Right: On at all times;Other (comment) Restrictions Weight Bearing Restrictions: Yes RLE Weight Bearing: Weight bearing as tolerated Other Position/Activity Restrictions: per Marissa Nestle, PA  General Chart Reviewed: Yes Family/Caregiver Present:  No  Vital Signs Therapy Vitals Temp: 98.4 F (36.9 C) Temp src: Oral Pulse Rate: 66 Resp: 18 BP: 102/64 mmHg Patient Position, if appropriate: Lying Oxygen Therapy SpO2: 100 % O2 Device: Nasal cannula O2 Flow Rate (L/min): 2 L/min  Home Living/Prior Functioning Home Living Available Help at Discharge: Family;Friend(s);Available 24 hours/day Type of Home: House Home Access: Stairs to enter [employer] of Steps: ~4, patient states neighbor can build ramp if  needed Entrance Stairs-Rails: Right;Left Home Layout: Two level;Able to live on main level with bedroom/bathroom Alternate Level Stairs-Number of Steps: 12 Alternate Level Stairs-Rails: Can reach both;Left;Right Additional Comments: 4 STE home with bilat hand rails (having second rail installed this week); neighbor able to install ramp, if needed. 12 steps to lower level/basement (laundry room).  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[employer] being with friends and family Prior Function Level of Independence: Independent with basic ADLs;Requires assistive device for independence;Independent with transfers;Independent with homemaking with ambulation;Independent with gait;Other (comment) (patient used a SPC for community mobility)  Able to Take Stairs?: Yes Driving: Yes Vocation: Part time employment Comments: Cane occasionally due to MS relapse  ADL - See FIM  Vision/Perception  Vision - History Baseline Vision: Wears glasses all the time Patient Visual Report: No change from baseline Vision - Assessment Eye Alignment: Within Functional Limits Vision  Assessment: Vision not tested Perception Perception: Within Functional Limits Praxis Praxis: Intact   Cognition Overall Cognitive Status: Within Functional Limits for tasks assessed Arousal/Alertness: Awake/alert Orientation Level: Oriented X4  Sensation Sensation Light Touch: Appears Intact (BUEs) Stereognosis: Appears Intact (BUEs) Hot/Cold: Appears Intact (BUEs) Proprioception: Appears Intact (BUEs) Coordination Gross Motor Movements are Fluid and Coordinated: Yes (BUEs) Fine Motor Movements are Fluid and Coordinated: Yes (BUEs)  Motor  Motor Motor: Abnormal tone;Abnormal postural alignment and control Motor - Skilled Clinical Observations: In seated/standing, majority of weight on L side, with passive elongation of trunk on L. Spasticity in bilat LE's.  Mobility  Bed Mobility Bed Mobility: Sit to Supine;Supine to Sit;Scooting to [facility];Sitting - Scoot to [place] of Bed;Rolling Left;Rolling Right Rolling Right: 4: Min assist Rolling Right Details: Verbal cues for technique;Verbal cues for precautions/safety Rolling Right Details (indicate cue type and reason): Min A to maintain pt adherence to R posterior hip precautions; verbal cueing for technique. Rolling Left: 3: Mod assist Rolling Left: Patient Percentage: 70% Rolling Left Details: Verbal cues for precautions/safety Rolling Left Details (indicate cue type and reason): Mod A to maintain adherence to R posterior hip precautions Supine to Sit: 4: Min assist;HOB flat;With rails Supine to Sit: Patient Percentage: 90% Supine to Sit Details: Verbal cues for precautions/safety;Verbal cues for technique Supine to Sit Details (indicate cue type and reason): Verbal cues for LLE movement, L hand placement. Sit to Supine: 4: Min assist;HOB flat;With rail Sit to Supine: Patient Percentage: 80% Sit to Supine - Details: Verbal cues for technique Sit to Supine - Details (indicate cue type and reason): Min A for RLE management Scooting to  [facility]: 3: Mod assist Scooting to [facility]: Patient Percentage: 50% Scooting to [facility] Details: Verbal cues for technique;Manual facilitation for placement Scooting to [facility] Details (indicate cue type and reason): Verbal cues for hand placement, technique Transfers Sit to Stand: 2: Max assist;3: Mod assist Sit to Stand Details: Verbal cues for precautions/safety;Verbal cues for technique;Manual facilitation for placement Sit to Stand Details (indicate cue type and reason): Cueing for setup, hand placement, and precautions. Max A from neutral height bed; min-mod A from elevated bed surface. Stand to Sit: 4: Min assist;To chair/3-in-1 Stand to Sit Details (indicate cue type and reason): Verbal cues for technique Stand to Sit Details: Min A to control descent; cueing for hand placement  Extremity/Trunk Assessment RUE Assessment RUE Assessment: Within Functional Limits (can benefit from BUE strengthening) LUE Assessment LUE Assessment: Within Functional Limits (can benefit from BUE strengthening)  FIM:  FIM - Eating Eating Activity: 5: Supervision/cues FIM - Grooming Grooming: 0: Activity did not occur FIM - Bathing Bathing: 0: Activity did not occur FIM - Upper Body Dressing/Undressing Upper body dressing/undressing steps patient completed: Thread/unthread left sleeve of pullover shirt/dress;Put head through opening of pull over shirt/dress;Pull shirt over trunk;Thread/unthread right sleeve of pullover shirt/dresss Upper body dressing/undressing: 5: Set-up assist to: Obtain clothing/put away FIM - Lower Body Dressing/Undressing Lower body dressing/undressing: 1: Total-Patient completed less than 25% of tasks FIM - Banker Devices: Bed rails;Orthosis;Walker (R knee immobilizer) Bed/Chair Transfer: 4: Supine > Sit: Min A (steadying Pt. > 75%/lift 1 leg);4: Sit > Supine: Min A (steadying pt. > 75%/lift 1 leg);2: Bed > Chair or W/C: Max A (lift and lower  assist);2: Chair or W/C > Bed: Max A (lift and lower assist) FIM - Archivist Transfers: 0-Activity did not occur FIM - Tub/Shower Transfers Tub/shower Transfers: 0-Activity did not occur or was simulated   Refer to Care Plan for Long Term Goals  Recommendations for other services: None at this time.   Discharge Criteria: Patient will be discharged from OT if patient refuses treatment 3 consecutive times without medical reason, if treatment goals not met, if there is a change in medical status, if patient makes no progress towards goals or if patient is discharged from hospital.  The above assessment, treatment plan, treatment alternatives and goals were discussed and mutually agreed upon: by patient  Kaena Santori 03/16/2013, 11:02 AM  -------------------------------------------------------------------------------------------------------------------------------------  SESSION NOTES  Session #1 [id number] - 55 Minutes Individual Therapy No complaints of pain Initial 1:1 occupational therapy evaluation completed. Patient found supine in bed. Patient with right KI donned. Patient engaged in bed mobility with minimal assistance, requiring assistance managing RLE. Patient sat edge of bed for UB/LB dressing. Patient able to verbalize posterior hip precautions, but due to weakness is unable to maintain no internal rotation; KI helps, but doesn't completely eliminate internal rotation. Therapist provided max verbal cues and tactile cues for no internal rotation. Patient able to complete sit<>stand with moderate assistance and perform functional ambulation using RW with minimal assistance. No complaints of pain during session. Checked with PA and patient is able to [facility] throughout RLE. At end of session, left patient seated EOB with RLE slightly elevated on stool and all needed items left within reach. TEDs donned.   Session #2 [id number] - 30 Minutes Individual Therapy No complaints  of pain Patient found seated in w/c with multiple family members present. Patient propelled self out of room and therapist propelled patient > therapy gym. Therapist adjusted right leg rest to decrease patient's ability to internally rotate RLE. Therapist also adjusted right KI. Patient then engaged in BUE strengthening using ergometer for ~6 minutes in order to increase overall strength and endurance. Therapist propelled patient back to room and left patient seated in w/c beside bed with family present and needed items within reach.

## 2013-03-16 NOTE — Progress Notes (Signed)
Social Work Assessment and Plan Social Work Assessment and Plan  Patient Details  Name: Kayla [name] MRN: [id number] Date of Birth: 07-21-1950  Today's Date: 03/16/2013  Problem List:  Patient Active Problem List   Diagnosis Date Noted  . S/P hip hemiarthroplasty 03/15/2013  . Hypoxia 03/14/2013  . Dislocation of hip prosthesis 03/12/2013  . Hip dislocation, left 03/12/2013  . Hip dislocation, right 03/12/2013  . Acute blood loss anemia 03/11/2013  . Urinary retention with incomplete bladder emptying 03/09/2013  . Leucocytosis 03/09/2013  . Dysphagia, unspecified(787.20) 03/09/2013  . Femoral neck fracture 03/08/2013  . CAP (community acquired pneumonia) 02/26/2013  . Unspecified constipation 02/26/2013  . HCAP (healthcare-associated pneumonia) 02/26/2013  . Acute diastolic heart failure 02/26/2013  . Vocal cord paralysis 02/26/2013  . Acute respiratory failure with hypoxia 02/24/2013  . Pain 02/24/2013  . Fractured femoral neck 02/23/2013  . Hip fracture 02/23/2013  . Excessive flatus 10/21/2012  . Cold feet 07/29/2012  . Hyponatremia 05/20/2012  . Calf pain 11/17/2011  . Back pain 11/27/2010  . DYSPNEA 12/06/2007  . OSTEOPENIA 09/17/2007  . HYPOTHYROIDISM 04/01/2007  . Multiple sclerosis 04/01/2007  . Other diseases of vocal cords 04/01/2007   Past Medical History:  Past Medical History  Diagnosis Date  . Multiple sclerosis   . Unspecified hypothyroidism   . Other voice and resonance disorders   . Other diseases of vocal cords   . Cervical cancer 2005   Past Surgical History:  Past Surgical History  Procedure Laterality Date  . Vesicovaginal fistula closure w/ tah    . Hip arthroplasty Right 03/01/2013    Procedure: ARTHROPLASTY BIPOLAR HIP;  Surgeon: Shelda Pal, MD;  Location: [facility];  Service: Orthopedics;  Laterality: Right;  . Hip closed reduction Right 03/12/2013    Procedure: CLOSED REDUCTION HIP;  Surgeon: [name], MD;  Location: [facility] OR;   Service: Orthopedics;  Laterality: Right;   Social History:  reports that she has never smoked. She has never used smokeless tobacco. She reports that she drinks alcohol. She reports that she does not use illicit drugs.  Family / Support Systems Marital Status: Married Patient Roles: Spouse;Parent Spouse/Significant Other: Kayla [name] [phone]  [phone] Children: Daughter on break from college-[employer] Other Supports: Kayla [name]-sister in-law [phone] Anticipated Caregiver: Husband works so daughter until she goes back to college then sister in-law can assist intermittently Ability/Limitations of Caregiver: See above Caregiver Availability: 24/7 Family Dynamics: Close knit family who are there for one another.  Pt wants to regain her independence so she will not burden her fmaily and she can get back to independence.  Social History Preferred language: English Religion: Presbyterian Cultural Background: No issues Education: [employer] Read: Yes Write: Yes Employment Status: Disabled Fish farm manager Issues: No issues Guardian/Conservator: None-according to MD pt is capable of making her own decisions   Abuse/Neglect Physical Abuse: Denies Verbal Abuse: Denies Sexual Abuse: Denies Exploitation of patient/patient's resources: Denies Self-Neglect: Denies  Emotional Status Pt's affect, behavior adn adjustment status: Pt is motivated to do well and recover from her fracture.  Sh eis more anxious since dislocating her hip.  She is being very careful and not moving too quickly.  She realizes she needs to build her confidence up. Recent Psychosocial Issues: Other medical issues-MS, alon gwith her UTI and pneumonia Pyschiatric History: No history deferred depression screen due to tired from therpaies and many family members in the room.  Will monitor along and work with team on her coping and  if intervention needed. Substance Abuse History: No issues  Patient /  Family Perceptions, Expectations & Goals Pt/Family understanding of illness & functional limitations: Pt and daughter have a good understanding of her fracture and precautions.  She needs to be reminded of them and feels once written down she will be better with them.   Premorbid pt/family roles/activities: Wife, Mother, retiree, Scientist, clinical (histocompatibility and immunogenetics), Church member, etc Anticipated changes in roles/activities/participation: resume Pt/family expectations/goals: Pt states'; " I want to do for myself and not be a burden on my family, they already help me enough."  Daughter states; " I will do what she needs so will my Dad and other family members."  [employer]: None Premorbid Home Care/DME Agencies: Other (Comment) (Had in the past) Transportation available at discharge: Family members Resource referrals recommended: Support group (specify) (MS Support group)  Discharge Planning Living Arrangements: Spouse/significant other Support Systems: Spouse/significant other;Children;Other relatives;Friends/neighbors;Church/faith community Type of Residence: Private residence Insurance Resources: Media planner (specify) Education officer, museum) Financial Resources: Family Support;SSD Financial Screen Referred: No Living Expenses: Lives with family Money Management: Spouse;Patient Does the patient have any problems obtaining your medications?: No Home Management: Self and husband Patient/Family Preliminary Plans: Return home with daughter who  is home from school break to assist then her sister in-law will step in to assist while her husband works.  Family will work out a plan to make sure pt's needs are met. Social Work Anticipated Follow Up Needs: HH/OP;Support Group  Clinical Impression Pleasant female who is willing to work through the pain and make gains in therapies.  Her family is very supportive and willing to assist if needed. Will work on discharge needs and target 1/8.  Lucy Chris 03/16/2013, 1:21 PM

## 2013-03-16 NOTE — Evaluation (Signed)
Speech Language Pathology Bedside Swallow Evaluation   Patient Details  Name: Kayla [name] MRN: [id number] Date of Birth: [birth year]  SLP Diagnosis: Dysphagia  Rehab Potential: Good ELOS: 14-18 days   Today's Date: 03/16/2013 Time: 4098-1191 Time Calculation (min): 25 min  Problem List:  Patient Active Problem List   Diagnosis Date Noted  . S/P hip hemiarthroplasty 03/15/2013  . Hypoxia 03/14/2013  . Dislocation of hip prosthesis 03/12/2013  . Hip dislocation, left 03/12/2013  . Hip dislocation, right 03/12/2013  . Acute blood loss anemia 03/11/2013  . Urinary retention with incomplete bladder emptying 03/09/2013  . Leucocytosis 03/09/2013  . Dysphagia, unspecified(787.20) 03/09/2013  . Femoral neck fracture 03/08/2013  . CAP (community acquired pneumonia) 02/26/2013  . Unspecified constipation 02/26/2013  . HCAP (healthcare-associated pneumonia) 02/26/2013  . Acute diastolic heart failure 02/26/2013  . Vocal cord paralysis 02/26/2013  . Acute respiratory failure with hypoxia 02/24/2013  . Pain 02/24/2013  . Fractured femoral neck 02/23/2013  . Hip fracture 02/23/2013  . Excessive flatus 10/21/2012  . Cold feet 07/29/2012  . Hyponatremia 05/20/2012  . Calf pain 11/17/2011  . Back pain 11/27/2010  . DYSPNEA 12/06/2007  . OSTEOPENIA 09/17/2007  . HYPOTHYROIDISM 04/01/2007  . Multiple sclerosis 04/01/2007  . Other diseases of vocal cords 04/01/2007   Past Medical History:  Past Medical History  Diagnosis Date  . Multiple sclerosis   . Unspecified hypothyroidism   . Other voice and resonance disorders   . Other diseases of vocal cords   . Cervical cancer 2005   Past Surgical History:  Past Surgical History  Procedure Laterality Date  . Vesicovaginal fistula closure w/ tah    . Hip arthroplasty Right 03/01/2013    Procedure: ARTHROPLASTY BIPOLAR HIP;  Surgeon: Shelda Pal, MD;  Location: [facility];  Service: Orthopedics;  Laterality: Right;  . Hip closed  reduction Right 03/12/2013    Procedure: CLOSED REDUCTION HIP;  Surgeon: [name], MD;  Location: [facility] OR;  Service: Orthopedics;  Laterality: Right;    Assessment / Plan / Recommendation Clinical Impression  Pt is a 62 y.o. 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 Surgical intervention recommended by Dr. Charlann Boxer but on 02/24/13 she developed dyspnea and hypoxia with intermittent stridor and was transferred to ICU. Dr. Emeline Darling consulted for input and laryngoscopy with stable bilateral true vocal fold paresis, narrow~62mm glottic airway anteriorly--ok for intubation. She developed fever on 12/05 due to pseudomonas UTI as well as suspicion of aspiration PNA and was treated broad antibiotic coverage. Neurology consulted for input on worsening of weakness as family reported increased fatigue and weakness lower and upper extremities prior to admission with recommendation to start steroid dose pack. Once stabilized, she underwent right hip hemi 03/01/13. She was extubated without difficulty and MBS done post op revealed moderate pharyngeal and cervical esophageal dysphagia and patient placed on Dys. 2 textures with thin liquids. She has had issues with pain and lethargy but is showing some improvement in participation with therapy. Continues to have increased oxygen needs with activity. CXR with left base atelectasis rather than consolidation and pulmonary toilet recommended by CCM. As mentation improving, diet advanced to Dys. 33 textures with thin liquids with compensatory strategies. Therapies ongoing and CIR recommended for progression. Pt was admitted 03/08/13 and has continued to require oxygen  due to hypoxia. She was treated with 2 units PRBC for symptomatic anemia with stabilization in BP  and improvement in endurance levels. Foley was discontinued and bladder program was initiated. Patient has been unable to void requiring qid in and out caths. Urine culture done showed no growth. Pain control has been reasonable and patient has had good motivation and has been making progress with therapy. On 03/12/13 pm, patient dislocated her hip while on commode and was taken to OR for Closed reduction by Dr. Despina Hick. Foley was replaced due to ongoing retention. Patient with high levels of anxiety regarding activity. Patient admitted back to CIR on 03/15/13 to complete her rehab program. Pt administered a BSE and consumed trials of Dys. 3 textures and thin liquids via cup without overt s/s of aspiration. Pt recalled swallowing compensatory strategies with Mod I but required Min verbal cues to utilize them throughout PO trials. Recommend to continue current diet with intermittent level of supervision as mentation appears to be improving. Pt's vocal quality is currently at baseline but would benefit from utilization of diaphragmatic breathing to increase overall breath support and coordination with swallowing. Pt will benefit from SLP services to maximize swallowing safety with possible diet upgrade prior to discharge home.    SLP Assessment  Patient will need skilled Speech Lanaguage Pathology Services during CIR admission    Recommendations  Diet Recommendations: Dysphagia 3 (Mechanical Soft);Thin liquid Liquid Administration via: Cup;No straw Medication Administration: Whole meds with puree Supervision: Patient able to self feed;Intermittent supervision to cue for compensatory strategies Compensations: Slow rate;Small sips/bites;Multiple dry swallows after each bite/sip;Follow solids with liquid Postural Changes and/or Swallow Maneuvers: Seated upright 90 degrees;Upright 30-60 min after meal;Chin tuck Oral Care Recommendations: Oral care BID Patient destination: Home Follow up Recommendations: Home  Health SLP Equipment Recommended: None recommended by SLP    SLP Frequency 5 out of 7 days   SLP Treatment/Interventions Cueing hierarchy;Dysphagia/aspiration precaution training;Environmental controls;Functional tasks;Internal/external aids;Patient/family education;Therapeutic Activities;Therapeutic Exercise;Speech/Language facilitation    Pain Pain Assessment Pain Assessment: 0-10 Pain Score: 4  Pain Type: Surgical pain Pain Location: Hip Pain Orientation: Right Pain Descriptors / Indicators: Aching Pain Frequency: Intermittent Pain Onset: Gradual Patients Stated Pain Goal: 0 Pain Intervention(s): Medication (See eMAR);Repositioned Multiple Pain Sites: No Prior Functioning Type of Home: House  Lives With: Spouse Available Help at Discharge: Family;Friend(s);Available 24 hours/day Vocation: Part time employment  Short Term Goals: Week 1: SLP Short Term Goal 1 (Week 1): Pt will utilize safe swallow strategies with Dys 3 textures and thin liquids with supervision verbal cues to reduce overt s/s of aspiration SLP Short Term Goal 2 (Week 1): Pt will consume trials of regular textures with Min cues for use of safe swallow strategies to assess readiness for advancement SLP Short Term Goal 3 (Week 1): Pt will demonstrate adequate diaphragmatic breathing for increased breath support at the phrase level with Min cues  See FIM for current functional status Refer to Care Plan for Long Term Goals  Recommendations for other services: None  Discharge Criteria: Patient will be discharged from SLP if patient refuses treatment 3 consecutive times without medical reason, if treatment goals not met, if there is a change in medical status, if patient makes no progress towards goals or if patient is discharged from hospital.  The above assessment, treatment plan, treatment alternatives and goals were discussed and mutually agreed upon: by patient and by family  Maleah Rabago 03/16/2013, 10:07  AM

## 2013-03-16 NOTE — Progress Notes (Signed)
Patient information reviewed and entered into eRehab system by Kenijah Benningfield, RN, CRRN, PPS Coordinator.  Information including medical coding and functional independence measure will be reviewed and updated through discharge.    

## 2013-03-16 NOTE — Progress Notes (Signed)
Subjective/Complaints: Restless last night. Had a hard time getting to sleep. Both legs/knees seem to bother her. Tramadol didn't help her rest either. A 12 point review of systems has been performed and if not noted above is otherwise negative.   Objective: Vital Signs: Blood pressure 102/64, pulse 66, temperature 98.4 F (36.9 C), temperature source Oral, resp. rate 18, height 5\' 3"  (1.6 m), weight 65.5 kg (144 lb 6.4 oz), SpO2 100.00%. No results found.  Recent Labs  03/16/13 0530  WBC 4.0  HGB 9.1*  HCT 27.7*  PLT 280    Recent Labs  03/16/13 0530  NA 138  K 3.7  CL 103  GLUCOSE 81  BUN 7  CREATININE 0.57  CALCIUM 8.5   CBG (last 3)  No results found for this basename: GLUCAP,  in the last 72 hours  Wt Readings from Last 3 Encounters:  03/15/13 65.5 kg (144 lb 6.4 oz)  03/13/13 63.2 kg (139 lb 5.3 oz)  03/09/13 63.05 kg (139 lb)    Physical Exam:  Constitutional: She is oriented to person, place, and time. She appears well-developed and well-nourished. She has a sickly appearance. Nasal cannula in place.  HENT:  Head: Normocephalic and atraumatic.  Eyes: Pupils are equal, round, and reactive to light.  Neck: Neck supple.  Cardiovascular: Normal rate and regular rhythm.  Respiratory: Effort normal. She has wheezes (occasional, more upper airway than anything else).  GI: Soft. Bowel sounds are normal. She exhibits no distension. There is no tenderness. There is no rebound and no guarding.  Musculoskeletal:  Right hip wound is dressed. She has KI on to sustain right leg position. Right leg is appropriately tender.  Neurological: She is alert and oriented to person, place, and time. Coordination abnormal.  Upper ext 5/5 bilaterally. Left lower 3+ HF to 4- KE, to 4 ankle. RLE is 1+ HF, 2 KE, 3/5 ADF, 3+ APF. No gross sensory deficits were appreciated.  Skin: Skin is warm and dry.  Psychiatric: She has a normal mood and affect. Her behavior is normal. Judgment  and thought content normal.    Assessment/Plan: 1. Functional deficits secondary to right FNF s/p hip hemi and subsequent dislocation. Pt also has MS. Her conditions require 3+ hours per day of interdisciplinary therapy in a comprehensive inpatient rehab setting. Physiatrist is providing close team supervision and 24 hour management of active medical problems listed below. Physiatrist and rehab team continue to assess barriers to discharge/monitor patient progress toward functional and medical goals. FIM: FIM - Bathing Bathing: 0: Activity did not occur  FIM - Upper Body Dressing/Undressing Upper body dressing/undressing steps patient completed: Thread/unthread left sleeve of pullover shirt/dress;Put head through opening of pull over shirt/dress;Pull shirt over trunk;Thread/unthread right sleeve of pullover shirt/dresss Upper body dressing/undressing: 5: Set-up assist to: Obtain clothing/put away FIM - Lower Body Dressing/Undressing Lower body dressing/undressing: 1: Total-Patient completed less than 25% of tasks     FIM - Archivist Transfers: 0-Activity did not occur  FIM - Games developer Transfer: 4: Supine > Sit: Min A (steadying Pt. > 75%/lift 1 leg);3: Bed > Chair or W/C: Mod A (lift or lower assist)     Comprehension Comprehension Mode: Auditory Comprehension: 6-Follows complex conversation/direction: With extra time/assistive device  Expression Expression Mode: Verbal Expression: 5-Expresses basic needs/ideas: With no assist  Social Interaction Social Interaction: 6-Interacts appropriately with others with medication or extra time (anti-anxiety, antidepressant).  Problem Solving Problem Solving: 6-Solves complex problems: With extra time  Memory Memory:  6-More than reasonable amt of time  Medical Problem List and Plan:  1. DVT Prophylaxis/Anticoagulation: Pharmaceutical: Lovenox  2. Pain Management: Prn tylenol and/or baclofen.  -will  try to stay with tylenol for general pain control.  -will try to use a whole tramadol at night for pain and sleep. Also can have prn baclofen  3. Mood: Provide ego support for high levels of anxiety. Educate on hip precautions.  4. Neuropsych: This patient is capable of making decisions on her own behalf.  5. MS with neurogenic bladder: Recheck UA/UCS negative thus far--culture pending -IC cath prn. Encourage OOB to toilet or commode to void. Follow for pattern  6. Hypoxia: Aspiration PNA has been treated.  -Encourage IS with flutter valve.  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  -continued oxygen via [place] for now 8. Leukocytosis: resolving. Wbc's 4k today 9. ABLA: Has improved post transfusion.  LOS (Days) 1 A FACE TO FACE EVALUATION WAS PERFORMED  SWARTZ,ZACHARY T 03/16/2013 8:54 AM

## 2013-03-16 NOTE — Evaluation (Signed)
Physical Therapy Assessment and Plan  Patient Details  Name: Kayla [name] MRN: [id number] Date of Birth: [birth year]  PT Diagnosis: Abnormal posture, Abnormality of gait, Edema, Hypertonia, Muscle weakness and Pain in joint Rehab Potential: Good ELOS: 14-16 days   Today's Date: 03/16/2013 Time: 4098-1191 and 1300-1355 Time Calculation (min): 60 min and 55 min  Problem List:  Patient Active Problem List   Diagnosis Date Noted  . S/P hip hemiarthroplasty 03/15/2013  . Hypoxia 03/14/2013  . Dislocation of hip prosthesis 03/12/2013  . Hip dislocation, left 03/12/2013  . Hip dislocation, right 03/12/2013  . Acute blood loss anemia 03/11/2013  . Urinary retention with incomplete bladder emptying 03/09/2013  . Leucocytosis 03/09/2013  . Dysphagia, unspecified(787.20) 03/09/2013  . Femoral neck fracture 03/08/2013  . CAP (community acquired pneumonia) 02/26/2013  . Unspecified constipation 02/26/2013  . HCAP (healthcare-associated pneumonia) 02/26/2013  . Acute diastolic heart failure 02/26/2013  . Vocal cord paralysis 02/26/2013  . Acute respiratory failure with hypoxia 02/24/2013  . Pain 02/24/2013  . Fractured femoral neck 02/23/2013  . Hip fracture 02/23/2013  . Excessive flatus 10/21/2012  . Cold feet 07/29/2012  . Hyponatremia 05/20/2012  . Calf pain 11/17/2011  . Back pain 11/27/2010  . DYSPNEA 12/06/2007  . OSTEOPENIA 09/17/2007  . HYPOTHYROIDISM 04/01/2007  . Multiple sclerosis 04/01/2007  . Other diseases of vocal cords 04/01/2007    Past Medical History:  Past Medical History  Diagnosis Date  . Multiple sclerosis   . Unspecified hypothyroidism   . Other voice and resonance disorders   . Other diseases of vocal cords   . Cervical cancer 2005   Past Surgical History:  Past Surgical History  Procedure Laterality Date  . Vesicovaginal fistula closure w/ tah    . Hip arthroplasty Right 03/01/2013    Procedure: ARTHROPLASTY BIPOLAR HIP;  Surgeon: Shelda Pal, MD;  Location: [facility];  Service: Orthopedics;  Laterality: Right;  . Hip closed reduction Right 03/12/2013    Procedure: CLOSED REDUCTION HIP;  Surgeon: [name], MD;  Location: [facility] OR;  Service: Orthopedics;  Laterality: Right;    Assessment & Plan Clinical Impression: Kayla [name] is a 62 y.o. 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 Surgical intervention recommended by Dr. Charlann Boxer but on 02/24/13 she developed dyspnea and hypoxia with intermittent stridor and was transferred to ICU. Dr. Emeline Darling consulted for input and laryngoscopy with stable bilateral true vocal fold paresis, narrow~21mm glottic airway anteriorly----ok for intubation. She developed fever on 12/05 due to pseudomonas UTI as well as suspicion of aspiration PNA and was treated broad antibiotic coverage. Neurology consulted for input on worsening of weakness as family reported increased fatigue and weakness lower and upper extremities prior to admission with recommendation to start steroid dose pack.    Once stabilized, she underwent right hip hemi by Dr. Charlann Boxer on 03/01/13 and placed on lovenox for DVT prophylaxis. She was extubated without difficulty and MBS done post op to evaluate swallow function. Continues to have increased oxygen needs with activity. CXR with left base atelectasis rather than consolidation and pulmonary toilet recommended by CCM.   She was admitted to CIR on 03/08/13 and has continued to require oxygen due to hypoxia. She was treated with 2 units PRBC for symptomatic anemia with stabilization in BP  and improvement in endurance levels. Foley was discontinued and bladder program was initiated. Patient has been unable to void requiring qid in and out caths. Urine culture done  showed no growth. Pain control has been reasonable and patient has had good motivation and has been making progress with therapy. On 03/12/13 pm, patient dislocated her hip while on commode and was taken to OR for Closed reduction by Dr. Despina Hick. Foley was replaced due to ongoing retention. Patient with high levels of anxiety regarding activity. Patient transferred to CIR on 03/15/2013 .   Patient currently requires Min to Mod A with mobility secondary to muscle weakness, decreased cardiorespiratoy endurance and decreased oxygen support, impaired timing and sequencing, abnormal tone, unbalanced muscle activation and decreased coordination and decreased sitting balance, decreased standing balance, decreased postural control, decreased balance strategies and difficulty maintaining precautions.  Prior to hospitalization, patient was  independent  with household mobility, modified independent with community mobility and lived with Spouse in a House home.  Home access is ~4, patient states neighbor can build ramp if neededStairs to enter.  Patient will benefit from skilled PT intervention to maximize safe functional mobility and minimize fall risk for planned discharge home with intermittent assist.  Anticipate patient will benefit from follow up [facility] at discharge.  PT - End of Session Endurance Deficit: Yes Endurance Deficit Description: Patient with general fatigue and decreased 02 support. Patient on 2 liters of 02 during therapy sessions at this time.   Skilled Therapeutic Intervention Treatment Session 1: PT evaluation initiated. See evaluation below for detailed findings. Pt received seated EOB; R knee immobilizer in place to maintain pt adherence to R hip precautions. Pt on 2L supplemental O2, [place] throughout session; SpO2 >98%. Pt able to verbalize all precautions without cueing. Performed w/c mobility x100' using bilat UE's with supervision. Gait x11' with rolling walker, min A, verbal cueing for adherence  to R hip precautions. Performed bed mobility; see below for detailed findings. Therapist departed with pt seated in w/c with R knee immobilizer in place, husband present, and all needs within reach.  2L/min of supplemental O2 via [place] with SpO2 100%.  Treatment Session 2:  Pt received seated in w/c; R knee immobilizer in place to maintain pt adherence to R hip precautions. Pt on 2L supplemental O2, [place] throughout session; SpO2 >99%. Performed w/c mobility x150' using bilat UE's with min A for technique with turning. Negotiation of 3 stairs with R rail, sideways, with max A to ascend, mod A to descend; verbal cues to adherence to hip precautions. Performed static and dynamic sitting/standing activities; see below for detailed findings. Performed multiple sit<>stand transfers from neutral height mat table with min A to mod A. Transported pt to room in w/c (secondary to pt fatigue), where session ended. Performed stand pivot from bed>chair with rolling walker and mod A. Sit>supine with mod A for bilat LE management. Therapist departed with pt semi-reclined in bed with bed alarm on, 3 rails up, bilat PRAFO boots donned, and all needs within reach. 2L/min of supplemental O2 via [place] with SpO2 99%  PT Evaluation Precautions/Restrictions Precautions Precautions: Fall;Posterior Hip;Other (comment) Precaution Booklet Issued: No Precaution Comments: Pt able to independently verbalize all posterior hip precautions. Required Braces or Orthoses: Knee Immobilizer - Right Knee Immobilizer - Right: On at all times;Other (comment) Restrictions Weight Bearing Restrictions: Yes RLE Weight Bearing: Non weight bearing Other Position/Activity Restrictions: per Marissa Nestle, PA Pain Pain Assessment Pain Assessment: 0-10 Pain Score: 4  Pain Type: Surgical pain  Pain Location: Hip Pain Orientation: Right Pain Descriptors / Indicators: Aching Pain Frequency: Intermittent Pain Onset: Gradual Patients Stated Pain Goal: 0 Pain  Intervention(s): Medication (See eMAR);Repositioned Multiple Pain Sites: No Home Living/Prior Functioning Home Living Available Help at Discharge: Family;Friend(s);Available 24 hours/day Type of Home: House Home Access: Stairs to enter [employer] of Steps: ~4, patient states neighbor can build ramp if needed Entrance Stairs-Rails: Right;Left Home Layout: Two level;Able to live on main level with bedroom/bathroom Alternate Level Stairs-Number of Steps: 12 Alternate Level Stairs-Rails: Can reach both;Left;Right Additional Comments: 4 STE home with bilat hand rails (having second rail installed this week); neighbor able to install ramp, if needed. 12 steps to lower level/basement (laundry room).  Lives With: Spouse Prior Function Level of Independence: Independent with basic ADLs;Requires assistive device for independence;Independent with transfers;Independent with homemaking with ambulation;Independent with gait;Other (comment) (patient used a SPC for community mobility)  Able to Take Stairs?: Yes Driving: Yes Vocation: Part time employment Comments: Cane occasionally due to MS relapse Vision/Perception  Vision - History Baseline Vision: Wears glasses all the time Patient Visual Report: No change from baseline Vision - Assessment Eye Alignment: Within Functional Limits Vision Assessment: Vision not tested Perception Perception: Within Functional Limits Praxis Praxis: Intact  Cognition   Sensation Sensation Light Touch: Appears Intact Stereognosis: Appears Intact Hot/Cold: Appears Intact Proprioception: Appears Intact Coordination Gross Motor Movements are Fluid and Coordinated: No Fine Motor Movements are Fluid and Coordinated: Yes (BUEs) Coordination and Movement Description: RLE impaired Motor  Motor Motor: Abnormal tone;Abnormal postural alignment and control Motor - Skilled Clinical Observations: In seated/standing, majority of weight on L side, with  passive elongation of trunk on L. Spasticity in bilat LE's.  Mobility Bed Mobility Bed Mobility: Sit to Supine;Supine to Sit;Scooting to [facility];Sitting - Scoot to [place] of Bed;Rolling Left;Rolling Right Rolling Right: 4: Min assist Rolling Right Details: Verbal cues for technique;Verbal cues for precautions/safety Rolling Right Details (indicate cue type and reason): Min A to maintain pt adherence to R posterior hip precautions; verbal cueing for technique. Rolling Left: 3: Mod assist Rolling Left: Patient Percentage: 70% Rolling Left Details: Verbal cues for precautions/safety Rolling Left Details (indicate cue type and reason): Mod A to maintain adherence to R posterior hip precautions Supine to Sit: 4: Min assist;HOB flat;With rails Supine to Sit: Patient Percentage: 90% Supine to Sit Details: Verbal cues for precautions/safety;Verbal cues for technique Supine to Sit Details (indicate cue type and reason): Verbal cues for LLE movement, L hand placement. Sit to Supine: 4: Min assist;HOB flat;With rail Sit to Supine: Patient Percentage: 80% Sit to Supine - Details: Verbal cues for technique Sit to Supine - Details (indicate cue type and reason): Min A for RLE management Scooting to [facility]: 3: Mod assist Scooting to [facility]: Patient Percentage: 50% Scooting to [facility] Details: Verbal cues for technique;Manual facilitation for placement Scooting to [facility] Details (indicate cue type and reason): Verbal cues for hand placement, technique Transfers Transfers: Yes Sit to Stand: 2: Max assist;3: Mod assist Sit to Stand Details: Verbal cues for precautions/safety;Verbal cues for technique;Manual facilitation for placement Sit to Stand Details (indicate cue type and reason): Cueing for setup, hand placement, and precautions. Max A from neutral height bed; min-mod A from elevated bed surface. Stand to Sit: 4: Min assist;To chair/3-in-1 Stand to Sit Details (indicate cue type and reason): Verbal cues for  technique Stand to Sit Details: Min A to control descent; cueing for hand placement Stand Pivot Transfers: 2: Max assist Print production planner Details (  indicate cue type and reason): From bed<>w/c with rolling walker, max A for sit>stand secondary to pt difficulty moving COM over BOS (may be attributable to R knee immobilizer) Locomotion  Wheelchair Mobility Distance: 100' with supervision during AM session; 150' with min A (for technique with turning) during PM session. Trunk/Postural Assessment  Cervical Assessment Cervical Assessment: Within Functional Limits Thoracic Assessment Thoracic Assessment: Within Functional Limits Lumbar Assessment Lumbar Assessment: Within Functional Limits Postural Control Postural Control: Deficits on evaluation Trunk Control: Decreased trunk control during dynamic sitting without UE support (posterior trunk lean with L hip flexion). Righting Reactions: Decreased speed of righting reactions with balance perturbations in seated.  Balance Balance Balance Assessed: Yes Static Sitting Balance Static Sitting - Balance Support: No upper extremity supported;Feet unsupported Static Sitting - Level of Assistance: 5: Stand by assistance Static Sitting - Comment/# of Minutes: 1 minute Dynamic Sitting Balance Dynamic Sitting - Balance Support: Feet unsupported;No upper extremity supported Dynamic Sitting - Level of Assistance: 3: Mod assist;4: Min assist Dynamic Sitting - Balance Activities: Lateral lean/weight shifting;Forward lean/weight shifting;Reaching for objects Static Standing Balance Static Standing - Balance Support: No upper extremity supported Static Standing - Level of Assistance: 4: Min assist Static Standing - Comment/# of Minutes: Static standing without UE support, min guard. Dynamic Standing Balance Dynamic Standing - Balance Support: During functional activity Dynamic Standing - Level of Assistance: 4: Min assist;3: Mod assist Dynamic  Standing - Balance Activities: Lateral lean/weight shifting;Forward lean/weight shifting;Reaching for objects Extremity Assessment  RUE Assessment RUE Assessment: Within Functional Limits (can benefit from BUE strengthening) LUE Assessment LUE Assessment: Within Functional Limits (can benefit from BUE strengthening) RLE Assessment RLE Assessment: Exceptions to [facility] RLE Strength RLE Overall Strength: Deficits RLE Overall Strength Comments: R hip grossly 3-/5 in all planes; R knee extension 3+/5, flexion 3/5; 4-/5 dorsiflexion/plantarflexion; 3-/5 eversion, 3+/5 inversion RLE Tone RLE Tone: Mild;Moderate RLE Tone Comments: R ankle plantarflexors LLE Assessment LLE Assessment: Exceptions to [facility] LLE Strength LLE Overall Strength: Deficits LLE Overall Strength Comments: L hip grossly 3+/5 to 4-/5 in all  planes; L knee 4-/5; plantar/dorsiflexion 4-/5, inversion 3+/5, eversion 3-/5 LLE Tone LLE Tone: Moderate;Mild LLE Tone Comments: moderate spasticity in L quadriceps; mild in L ankle plantarflexors  FIM:  FIM - Bed/Chair Transfer Bed/Chair Transfer Assistive Devices: Bed rails;Orthosis;Walker (R knee immobilizer) Bed/Chair Transfer: 4: Supine > Sit: Min A (steadying Pt. > 75%/lift 1 leg);4: Sit > Supine: Min A (steadying pt. > 75%/lift 1 leg);2: Bed > Chair or W/C: Max A (lift and lower assist);2: Chair or W/C > Bed: Max A (lift and lower assist) FIM - Locomotion: Wheelchair Distance: 100 Locomotion: Wheelchair: 2: Travels 50 - 149 ft with supervision, cueing or coaxing FIM - Locomotion: Ambulation Locomotion: Ambulation: 1: Travels less than 50 ft with minimal assistance (Pt.>75%) FIM - Locomotion: Stairs Locomotion: Stairs:  (Not yet assessed due to time constraint)   Refer to Care Plan for Long Term Goals  Recommendations for other services: None  Discharge Criteria: Patient will be discharged from PT if patient refuses treatment 3 consecutive times without medical reason, if  treatment goals not met, if there is a change in medical status, if patient makes no progress towards goals or if patient is discharged from hospital.  The above assessment, treatment plan, treatment alternatives and goals were discussed and mutually agreed upon: by patient  Calvert Cantor 03/16/2013, 4:17 PM

## 2013-03-16 NOTE — Care Management Note (Signed)
[facility]patient [facility] Individual Statement of Services  Patient Name:  Kayla [name]  Date:  03/16/2013  Welcome to the [facility]patient [facility].  Our goal is to provide you with an individualized program based on your diagnosis and situation, designed to meet your specific needs.  With this comprehensive rehabilitation program, you will be expected to participate in at least 3 hours of rehabilitation therapies Monday-Friday, with modified therapy programming on the weekends.  Your rehabilitation program will include the following services:  Physical Therapy (PT), Occupational Therapy (OT), 24 hour per day rehabilitation nursing, Neuropsychology, Case Management (Social Worker), Rehabilitation Medicine, Nutrition Services and Pharmacy Services, Speech Therapy  Weekly team conferences will be held on Wednesday to discuss your progress.  Your Social Worker will talk with you frequently to get your input and to update you on team discussions.  Team conferences with you and your family in attendance may also be held.  Expected length of stay: 14-18 days  Overall anticipated outcome: supervision/mod/i level  Depending on your progress and recovery, your program may change. Your Social Worker will coordinate services and will keep you informed of any changes. Your Social Worker's name and contact numbers are listed  below.  The following services may also be recommended but are not provided by the Inpatient Rehabilitation [facility]:   Home Health Rehabiltiation Services  Outpatient Rehabilitation Services    Arrangements will be made to provide these services after discharge if needed.  Arrangements include referral to agencies that provide these services.  Your insurance has been verified to be:  [facility] Your primary doctor is:  Dr. Sandrea Hughs  Pertinent information will be shared with your doctor and your insurance company.  Social Worker:  Dossie Der, SW [phone] or ([phone]  Information discussed with and copy given to patient by: Lucy Chris, 03/16/2013, 10:04 AM

## 2013-03-17 LAB — URINE CULTURE
Colony Count: NO GROWTH
Culture: NO GROWTH

## 2013-03-17 MED ORDER — HYDROCODONE-ACETAMINOPHEN 5-325 MG PO TABS
1.0000 | ORAL_TABLET | Freq: Every day | ORAL | Status: DC
  Administered 2013-03-17 – 2013-03-30 (×12): 1 via ORAL
  Filled 2013-03-17 (×12): qty 1

## 2013-03-17 MED ORDER — FERROUS SULFATE 325 (65 FE) MG PO TABS
325.0000 mg | ORAL_TABLET | Freq: Three times a day (TID) | ORAL | Status: DC
  Administered 2013-03-20 – 2013-03-31 (×35): 325 mg via ORAL
  Filled 2013-03-17 (×37): qty 1

## 2013-03-17 MED ORDER — FLEET ENEMA 7-19 GM/118ML RE ENEM: 1.0000 | ENEMA | Freq: Every day | RECTAL | Status: DC | PRN

## 2013-03-17 MED ORDER — TRAZODONE HCL 50 MG PO TABS
25.0000 mg | ORAL_TABLET | Freq: Once | ORAL | Status: AC
  Administered 2013-03-17: 25 mg via ORAL
  Filled 2013-03-17: qty 1

## 2013-03-17 MED ORDER — BETHANECHOL CHLORIDE 10 MG PO TABS
10.0000 mg | ORAL_TABLET | Freq: Three times a day (TID) | ORAL | Status: DC
  Administered 2013-03-17 – 2013-03-21 (×14): 10 mg via ORAL
  Filled 2013-03-17 (×19): qty 1

## 2013-03-17 MED ORDER — SORBITOL 70 % SOLN
30.0000 mL | Freq: Every day | Status: DC | PRN
  Administered 2013-03-17: 30 mL via ORAL
  Filled 2013-03-17: qty 30

## 2013-03-17 NOTE — Progress Notes (Signed)
Patient's last BM 03/12/2013 patient refuse prn laxatives everytime offer.  RN educate patient the need for having regular bowel movement, patient states her regular bowel movement is every 2-3 days.  Will continue to monitor.

## 2013-03-17 NOTE — Progress Notes (Signed)
Nursing Note: Offered laxative and pt again wanted to wait as she does routinely.Educated pt again of importance of regular BM as stool is waste that just hangs around too long if no regular BM.Pt agreed and took 30 cc of sorbitol po.wbb

## 2013-03-17 NOTE — Progress Notes (Signed)
Subjective/Complaints: Tramadol doesn't help pain or hs sleep. Pt reports urinary urgency, but chart/RN report urine retention and I and O caths. Pt is also constipated. A 12 point review of systems has been performed and if not noted above is otherwise negative.   Objective: Vital Signs: Blood pressure 92/59, pulse 54, temperature 98.5 F (36.9 C), temperature source Oral, resp. rate 20, height 5\' 3"  (1.6 m), weight 65.5 kg (144 lb 6.4 oz), SpO2 100.00%. No results found.  Recent Labs  03/16/13 0530  WBC 4.0  HGB 9.1*  HCT 27.7*  PLT 280    Recent Labs  03/16/13 0530  NA 138  K 3.7  CL 103  GLUCOSE 81  BUN 7  CREATININE 0.57  CALCIUM 8.5   CBG (last 3)  No results found for this basename: GLUCAP,  in the last 72 hours  Wt Readings from Last 3 Encounters:  03/15/13 65.5 kg (144 lb 6.4 oz)  03/13/13 63.2 kg (139 lb 5.3 oz)  03/09/13 63.05 kg (139 lb)    Physical Exam:  Constitutional: She is oriented to person, place, and time. She appears well-developed and well-nourished.  HENT:  Head: Normocephalic and atraumatic.  Eyes: Pupils are equal, round, and reactive to light.  Neck: Neck supple.  Cardiovascular: Normal rate and regular rhythm.  Respiratory: Effort normal. She has normal breath sounds.  GI: Soft. Bowel sounds are normal. She exhibits no distension. There is no tenderness. There is no rebound and no guarding.  Musculoskeletal:  Right hip wound is dressed. She has KI on to sustain right leg position. Right leg is appropriately tender.  Neurological: She is alert and oriented to person, place, and time. Coordination abnormal.  Upper ext 5/5 bilaterally. Left lower 3+ HF to 4- KE, to 4 ankle. RLE is 1+ HF, 2 KE, 3/5 ADF, 3+ APF. No gross sensory deficits were appreciated.  Skin: Skin is warm and dry.  Psychiatric: She has a normal mood and affect. Her behavior is normal. Judgment and thought content normal.    Assessment/Plan: 1. Functional deficits  secondary to right FNF s/p hip hemi and subsequent dislocation. Pt also has MS. Her conditions require 3+ hours per day of interdisciplinary therapy in a comprehensive inpatient rehab setting. Physiatrist is providing close team supervision and 24 hour management of active medical problems listed below. Physiatrist and rehab team continue to assess barriers to discharge/monitor patient progress toward functional and medical goals. FIM: FIM - Bathing Bathing: 0: Activity did not occur  FIM - Upper Body Dressing/Undressing Upper body dressing/undressing steps patient completed: Thread/unthread left sleeve of pullover shirt/dress;Put head through opening of pull over shirt/dress;Pull shirt over trunk;Thread/unthread right sleeve of pullover shirt/dresss Upper body dressing/undressing: 5: Set-up assist to: Obtain clothing/put away FIM - Lower Body Dressing/Undressing Lower body dressing/undressing: 1: Total-Patient completed less than 25% of tasks  FIM - Hotel manager Devices: Grab bar or rail for support Toileting: 1: Total-Patient completed zero steps, helper did all 3  FIM - Diplomatic Services operational officer Devices: Art gallery manager Transfers: 3-To toilet/BSC: Mod A (lift or lower assist);3-From toilet/BSC: Mod A (lift or lower assist)  FIM - Bed/Chair Transfer Bed/Chair Transfer Assistive Devices: Bed rails;Orthosis;Walker (R knee immobilizer)) Bed/Chair Transfer: 3: Sit > Supine: Mod A (lifting assist/Pt. 50-74%/lift 2 legs);3: Chair or W/C > Bed: Mod A (lift or lower assist);3: Bed > Chair or W/C: Mod A (lift or lower assist)  FIM - Locomotion: Wheelchair Distance:  (Omit   [facility] 03/16/13) Locomotion: Wheelchair:  4: Travels 150 ft or more: maneuvers on rugs and over door sillls with minimal assistance (Pt.>75%) FIM - Locomotion: Ambulation Locomotion: Ambulation: 1: Travels less than 50 ft with minimal assistance (Pt.>75%)  Comprehension Comprehension Mode:  Auditory Comprehension: 6-Follows complex conversation/direction: With extra time/assistive device  Expression Expression Mode: Verbal Expression: 5-Expresses basic needs/ideas: With no assist  Social Interaction Social Interaction: 6-Interacts appropriately with others with medication or extra time (anti-anxiety, antidepressant).  Problem Solving Problem Solving: 6-Solves complex problems: With extra time  Memory Memory: 6-More than reasonable amt of time  Medical Problem List and Plan:  1. DVT Prophylaxis/Anticoagulation: Pharmaceutical: Lovenox  2. Pain Management: Prn tylenol and/or baclofen.  -will try to stay with tylenol for the most part -will use the hydrocodone at night to help with sleep and pain (pt requests)--admin at 9pm 3. Mood: Provide ego support for high levels of anxiety. Educate on hip precautions.  4. Neuropsych: This patient is capable of making decisions on her own behalf.  5. MS with neurogenic bladder: Recheck UA/UCS negative   -IC cath prn. Added urecholine today -augment bowel meds/program 6. Hypoxia: Aspiration PNA has been treated.  -Encourage IS with flutter valve.  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  -continued oxygen via [place] for now---wean as able 8. Leukocytosis: resolving. Wbc's 4k today 9. ABLA: Has improved post transfusion.  LOS (Days) 2 A FACE TO FACE EVALUATION WAS PERFORMED  SWARTZ,ZACHARY T 03/17/2013 9:42 AM

## 2013-03-17 NOTE — IPOC Note (Signed)
Overall Plan of Care [facility]) Patient Details Name: Kayla [name] MRN: [id number] DOB: 11-Sep-1950  Admitting Diagnosis: HIP FX  Hospital Problems: Active Problems:   DYSPNEA   Unspecified constipation   Urinary retention with incomplete bladder emptying   Hip dislocation, right   Hypoxia   S/P hip hemiarthroplasty     Functional Problem List: Nursing Bladder;Bowel;Pain;Safety;Sensory;Skin Integrity  PT Balance;Edema;Endurance;Motor;Perception;Pain;Safety  OT Balance;Edema;Motor;Endurance;Pain;Perception;Safety;Sensory;Skin Integrity  SLP    TR         Basic ADL's: OT Grooming;Bathing;Dressing;Toileting     Advanced  ADL's: OT Simple Meal Preparation;Light Housekeeping     Transfers: PT Bed Mobility;Bed to Chair;Car;Furniture  OT Tub/Shower;Toilet     Locomotion: PT Ambulation;Wheelchair Mobility;Stairs     Additional Impairments: OT None (can benefit from BUE strengthening)  SLP Swallowing;Communication expression    TR      Anticipated Outcomes Item Anticipated Outcome  Self Feeding independent (currently at this level)  Swallowing  Mod I   Basic self-care  supervision  Toileting  supervision   Bathroom Transfers supervision  Bowel/Bladder  continent of bladder timed toileting and remain continent of bowel   Transfers  Supervision-Mod I  Locomotion  Min A-Mod I  Communication  Mod I   Cognition     Pain  3 or less on scale 0-10  Safety/Judgment  adhere to safety precaution   Therapy Plan: PT Intensity: Minimum of 1-2 x/day ,45 to 90 minutes PT Frequency: 5 out of 7 days PT Duration Estimated Length of Stay: 14-16 days OT Intensity: Minimum of 1-2 x/day, 45 to 90 minutes OT Frequency: 5 out of 7 days OT Duration/Estimated Length of Stay: 14-18 days SLP Intensity: Minumum of 1-2 x/day, 30 to 90 minutes SLP Frequency: 5 out of 7 days SLP Duration/Estimated Length of Stay: 14-18 days       Team Interventions: Nursing Interventions  Patient/Family Education;Bladder Management;Bowel Management;Pain Management;Medication Management;Skin Care/Wound Management  PT interventions Ambulation/gait training;Balance/vestibular training;Discharge planning;DME/adaptive equipment instruction;Functional mobility training;Patient/family education;Neuromuscular re-education;Splinting/orthotics;Stair training  OT Interventions Balance/vestibular training;Community reintegration;Discharge planning;DME/adaptive equipment instruction;Pain management;Patient/family education;Self Care/advanced ADL retraining;Therapeutic Activities;Therapeutic Exercise;UE/LE Strength taining/ROM;UE/LE Coordination activities;Functional mobility training;Psychosocial support;Splinting/orthotics;Skin care/wound managment;Wheelchair propulsion/positioning  SLP Interventions Cueing hierarchy;Dysphagia/aspiration precaution training;Environmental controls;Functional tasks;Internal/external aids;Patient/family education;Therapeutic Activities;Therapeutic Exercise;Speech/Language facilitation  TR Interventions    SW/CM Interventions Discharge Planning;Psychosocial Support;Patient/Family Education    Team Discharge Planning: Destination: PT-Home ,OT- Home , SLP-Home Projected Follow-up: PT-Home health PT, OT-  Home health OT, SLP-Home Health SLP Projected Equipment Needs: PT-To be determined, OT- Tub/shower bench;3 in 1 bedside comode, SLP-None recommended by SLP Equipment Details: PT-Pt owns personal standard cane and standard walker. Will likely require rolling walker at discharge. Need for w/c  and cushion TBD., OT-  Patient/family involved in discharge planning: PT- Patient,  OT-Patient, SLP-Patient;Family member/caregiver  MD ELOS: 14-18 days Medical Rehab Prognosis:  Excellent Assessment: The patient has been admitted for CIR therapies. The team will be addressing, functional mobility, strength, stamina, balance, safety, adaptive techniques/equipment, self-care, bowel  and bladder mgt, patient and caregiver education, swallowing, hip precautions, pain mgt. Goals have been set at supervision for basic self-care, min assist to mod I for transfers and locomotion. Has supportive family.    Ranelle Oyster, MD, FAAPMR      See Team Conference Notes for weekly updates to the plan of care

## 2013-03-17 NOTE — Progress Notes (Signed)
Nursing Note: Pt cathed form 600 cc by Kevin,NT.Pt tolerated well.wbb

## 2013-03-18 ENCOUNTER — Inpatient Hospital Stay (HOSPITAL_COMMUNITY): Payer: 59 | Admitting: Physical Therapy

## 2013-03-18 ENCOUNTER — Inpatient Hospital Stay (HOSPITAL_COMMUNITY): Payer: 59 | Admitting: *Deleted

## 2013-03-18 ENCOUNTER — Inpatient Hospital Stay (HOSPITAL_COMMUNITY): Payer: 59 | Admitting: Speech Pathology

## 2013-03-18 MED ORDER — SODIUM CHLORIDE 0.9 % IJ SOLN
10.0000 mL | INTRAMUSCULAR | Status: DC | PRN
  Administered 2013-03-18: 10 mL
  Administered 2013-03-21 – 2013-03-23 (×2): 30 mL

## 2013-03-18 MED ORDER — SENNOSIDES-DOCUSATE SODIUM 8.6-50 MG PO TABS: 2.0000 | ORAL_TABLET | Freq: Every evening | ORAL | Status: DC | PRN

## 2013-03-18 MED ORDER — SODIUM CHLORIDE 0.9 % IJ SOLN
10.0000 mL | Freq: Two times a day (BID) | INTRAMUSCULAR | Status: DC
  Administered 2013-03-23: 10 mL

## 2013-03-18 NOTE — Progress Notes (Signed)
Physical Therapy Session Note  Patient Details  Name: Kayla [name] MRN: [id number] Date of Birth: May 27, 1950  Today's Date: 03/18/2013 Time: 0800-0900 and 1300-1345 Time Calculation (min): 60 min and 45 min  Short Term Goals: Week 1:  PT Short Term Goal 1 (Week 1): Pt to consistently perform bed mobility with supervision using rails with HOB flat. PT Short Term Goal 2 (Week 1): Pt to perform bed<>chair transfer with min A and rolling walker. PT Short Term Goal 3 (Week 1): Pt to perform gait x50' with supervision using LRAD. PT Short Term Goal 4 (Week 1): Pt to negotiate 4 steps with R rail requiring mod A.  Skilled Therapeutic Interventions/Progress Updates:    Treatment Session 1: Pt received seated in w/c with KI on RLE. Per PA, may wean pt off supplemental O2 while maintaining SpO2 >91% at all times. SpO2 >93% throughout session. Performed w/c propulsion x150' using bilat UE's with supervision, cueing for technique with turning. Performed stand pivot from w/c>bed with min A, rolling walker.   KI removed from RLE for graded sit<>stands x12 reps total (rest breaks between reps, as needed) from progressively lower mat table to rolling walker with manual facilitation at RLE to maintain neutral R hip rotation. With stand>sit, manual cueing at distal R hamstrings to promote eccentric contraction, as pt exhibits R knee extension during transfer. Verbal cueing for safe hand placement with effective carryover for remainder of session.   Without R KE, performed gait x50' in controlled environment with rolling walker, min A, and manual correction of excessive R hip internal rotation, R hip retraction. R genu recurvatum x1 occurrence during gait trial. Transported pt to room in w/c secondary to pt fatigue. Therapist departed with pt seated in w/c with KI on RLE and all needs within reach.  Treatment Session 2: Pt received seated in w/c; asleep but easily awakened. Doffed R KI. Performed sit>stand from  w/c to rolling walker with supervision. Donned ACE bandage at R hip/knee to promote R hip external rotation. Gait x50', x22' in controlled environment with min guard, manual facilitation at R ribs, L axilla for postural normalization. Performed w/c self-propulsion with bilat LE's x50' forward-facing, x25' retro  for LE strengthening. SpO2 >99% throughout session without supplemental O2. Session ended in pt room, where pt was left seated in w/c with R KI on, family members present, and all needs within reach.  Therapy Documentation Precautions:  Precautions Precautions: Fall;Posterior Hip;Other (comment) Precaution Booklet Issued: No Precaution Comments: Pt able to independently verbalize all posterior hip precautions. Required Braces or Orthoses: Knee Immobilizer - Right Knee Immobilizer - Right: On at all times;Other (comment) Restrictions Weight Bearing Restrictions: Yes RLE Weight Bearing: Non weight bearing Other Position/Activity Restrictions: per Marissa Nestle, PA Vital Signs: Oxygen Therapy O2 Device: None (Room air) (found pt on room air) Pain: Pain Assessment Pain Assessment: No/denies pain Pain Score: 0-No pain Pain Orientation: Right Pain Descriptors / Indicators: Tender Pain Intervention(s): Medication (See eMAR) Locomotion : Ambulation Ambulation/Gait Assistance: 4: Min assist Wheelchair Mobility Distance: 150   See FIM for current functional status  Therapy/Group: Individual Therapy  Hobble, Lorenda Ishihara 03/18/2013, 12:13 PM

## 2013-03-18 NOTE — Progress Notes (Signed)
Speech Language Pathology Daily Session Note  Patient Details  Name: Kayla [name] MRN: [id number] Date of Birth: [birth year]  Today's Date: 03/18/2013 Time: 7829-5621 Time Calculation (min): 30 min  Short Term Goals: Week 1: SLP Short Term Goal 1 (Week 1): Pt will utilize safe swallow strategies with Dys 3 textures and thin liquids with supervision verbal cues to reduce overt s/s of aspiration SLP Short Term Goal 2 (Week 1): Pt will consume trials of regular textures with Min cues for use of safe swallow strategies to assess readiness for advancement SLP Short Term Goal 3 (Week 1): Pt will demonstrate adequate diaphragmatic breathing for increased breath support at the phrase level with Min cues  Skilled Therapeutic Interventions: Treatment focus on dysphagia goals. Upon arrival, pt sitting up in wheelchair without supplemental oxygen. Pt is currently being weaned from supplemental O2 and has been maintaining her saturations throughout the day. SLP facilitated session by providing trials of regular textures with thin liquids via cup. Pt demonstrated mildly prolonged but efficient mastication and required supervision verbal cues for utilization of swallowing compensatory strategies. Pt demonstrated cough X 1, suspect due to large bite.  Pt's O2 saturations remained between 96-98% throughout the session, however, pt continues to demonstrate an intermittent Inspiratory stridor which she reports is he baseline and contributes it to her multiple sclerosis. Recommend to continue current diet with trials of regular textures with SLP.    FIM:  Comprehension Comprehension Mode: Auditory Comprehension: 6-Follows complex conversation/direction: With extra time/assistive device Expression Expression Mode: Verbal Expression: 5-Expresses basic needs/ideas: With no assist Social Interaction Social Interaction: 6-Interacts appropriately with others with medication or extra time (anti-anxiety,  antidepressant). Problem Solving Problem Solving: 5-Solves basic 90% of the time/requires cueing < 10% of the time Memory Memory: 6-More than reasonable amt of time FIM - Eating Eating Activity: 5: Supervision/cues  Pain Pain Assessment Pain Assessment: No/denies pain  Therapy/Group: Individual Therapy  Tylar Merendino 03/18/2013, 4:19 PM

## 2013-03-18 NOTE — Progress Notes (Addendum)
..  Occupational Therapy Note   Patient Details  Name: Kayla [name] MRN: [id number] Date of Birth: November 05, 1950 Today's Date: 03/18/2013  Time:  0800-0900  (60 min)  1st session Pain:none Individual session   1st session:  .Focus of treatment was bed mobility, transfers,  standing balance, AE use.  Pt tansferred from supine to sit with increased time to EOB. Transferred from bed to wc with min assist.  Did sit to stand with minimal assist.  Stood for 1-2 min with minimal assist for peri care.  .  Pt used AE PRN.  Needed minimal assist with pants and socks.  Max cues for keeping foot in neutral position and not turn internally.   Pt. Left in wc with nursing heating up breakfast.   Call bell within reach.      Time:  1345-1425  ( )  2nd session Pain:5/10 right hip and decreasing to 1/5 at end of session Individual session  Pt. Propelled wc to ADL apartment.  Used RW to walk around kitchen and retrieve items for making breakfast.  Issued walker bag for pt to carry items in.  Discussed carrying food in containers for decreased spillage.  Pt. Stated she would eat her yogurt and coffee on sofa.  Went to sit down on sofa and right hip popped as pt went to sit.  Pt.stated her pain went to 5/10.  She said she was really tired from her previous sessions.  Needed plus 2 to go from sit to stand and put in wc.  AT end of session, pt said her pain was 1/10  Informed RN and asked for pain meds.  Pt. Left in room with family present.        Humberto Seals 03/18/2013, 8:44 AM

## 2013-03-18 NOTE — Progress Notes (Signed)
Subjective/Complaints: Slept better last night. Still needs to be cathed. Refused laxative per RN last night. Right hip a little sore. A 12 point review of systems has been performed and if not noted above is otherwise negative.   Objective: Vital Signs: Blood pressure 102/54, pulse 57, temperature 98 F (36.7 C), temperature source Oral, resp. rate 16, height 5\' 3"  (1.6 m), weight 65.5 kg (144 lb 6.4 oz), SpO2 99.00%. No results found.  Recent Labs  03/16/13 0530  WBC 4.0  HGB 9.1*  HCT 27.7*  PLT 280    Recent Labs  03/16/13 0530  NA 138  K 3.7  CL 103  GLUCOSE 81  BUN 7  CREATININE 0.57  CALCIUM 8.5   CBG (last 3)  No results found for this basename: GLUCAP,  in the last 72 hours  Wt Readings from Last 3 Encounters:  03/15/13 65.5 kg (144 lb 6.4 oz)  03/13/13 63.2 kg (139 lb 5.3 oz)  03/09/13 63.05 kg (139 lb)    Physical Exam:  Constitutional: She is oriented to person, place, and time. She appears well-developed and well-nourished.  HENT:  Head: Normocephalic and atraumatic.  Eyes: Pupils are equal, round, and reactive to light.  Neck: Neck supple.  Cardiovascular: Normal rate and regular rhythm.  Respiratory: Effort normal. She has normal breath sounds.  GI: Soft. Bowel sounds are normal. She exhibits no distension. There is no tenderness. There is no rebound and no guarding.  Musculoskeletal:  Right hip wound is clean and intact with removable dressing still in place, clinging.  Right leg is appropriately tender.  Neurological: She is alert and oriented to person, place, and time. Coordination abnormal.  Upper ext 5/5 bilaterally. Left lower 3+ HF to 4- KE, to 4 ankle. RLE is 1+ HF, 2 KE, 3/5 ADF, 3+ APF. No gross sensory deficits were appreciated.  Skin: Skin is warm and dry.  Psychiatric: She has a normal mood and affect. Her behavior is normal. Judgment and thought content normal.    Assessment/Plan: 1. Functional deficits secondary to right FNF  s/p hip hemi and subsequent dislocation. Pt also has MS. Her conditions require 3+ hours per day of interdisciplinary therapy in a comprehensive inpatient rehab setting. Physiatrist is providing close team supervision and 24 hour management of active medical problems listed below. Physiatrist and rehab team continue to assess barriers to discharge/monitor patient progress toward functional and medical goals. FIM: FIM - Bathing Bathing: 0: Activity did not occur  FIM - Upper Body Dressing/Undressing Upper body dressing/undressing steps patient completed: Thread/unthread left sleeve of pullover shirt/dress;Put head through opening of pull over shirt/dress;Pull shirt over trunk;Thread/unthread right sleeve of pullover shirt/dresss Upper body dressing/undressing: 5: Set-up assist to: Obtain clothing/put away FIM - Lower Body Dressing/Undressing Lower body dressing/undressing: 1: Total-Patient completed less than 25% of tasks  FIM - Hotel manager Devices: Grab bar or rail for support Toileting: 1: Total-Patient completed zero steps, helper did all 3  FIM - Diplomatic Services operational officer Devices: Art gallery manager Transfers: 3-To toilet/BSC: Mod A (lift or lower assist);3-From toilet/BSC: Mod A (lift or lower assist)  FIM - Bed/Chair Transfer Bed/Chair Transfer Assistive Devices: Bed rails;Orthosis;Walker (R knee immobilizer)) Bed/Chair Transfer: 3: Sit > Supine: Mod A (lifting assist/Pt. 50-74%/lift 2 legs);3: Chair or W/C > Bed: Mod A (lift or lower assist);3: Bed > Chair or W/C: Mod A (lift or lower assist)  FIM - Locomotion: Wheelchair Distance:  (Omit   [facility] 03/16/13) Locomotion: Wheelchair: 4: Travels 150 ft  or more: maneuvers on rugs and over door sillls with minimal assistance (Pt.>75%) FIM - Locomotion: Ambulation Locomotion: Ambulation: 1: Travels less than 50 ft with minimal assistance (Pt.>75%)  Comprehension Comprehension Mode: Auditory Comprehension:  6-Follows complex conversation/direction: With extra time/assistive device  Expression Expression Mode: Verbal Expression: 5-Expresses basic needs/ideas: With no assist  Social Interaction Social Interaction: 6-Interacts appropriately with others with medication or extra time (anti-anxiety, antidepressant).  Problem Solving Problem Solving: 5-Solves basic 90% of the time/requires cueing < 10% of the time  Memory Memory: 6-More than reasonable amt of time  Medical Problem List and Plan:  1. DVT Prophylaxis/Anticoagulation: Pharmaceutical: Lovenox  2. Pain Management: Prn tylenol and/or baclofen.  -tylenol -will use the hydrocodone at night to help with sleep and pain--admin at 9pm 3. Mood: Provide ego support for high levels of anxiety. Educate on hip precautions.  4. Neuropsych: This patient is capable of making decisions on her own behalf.  5. MS with neurogenic bladder: Recheck UA/UCS negative   -IC cath prn. Added urecholine  -needs timed voiding schedule -augment bowel meds/program---review program with pt (she needs to buy in) 6. Hypoxia: Aspiration PNA has been treated.  -Encourage IS with flutter valve.  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  -wean O2 as able 8. Leukocytosis: resolving. Wbc's 4k today 9. ABLA: Has improved post transfusion.  LOS (Days) 3 A FACE TO FACE EVALUATION WAS PERFORMED  SWARTZ,ZACHARY T 03/18/2013 8:40 AM

## 2013-03-18 NOTE — Plan of Care (Signed)
Problem: RH PAIN MANAGEMENT Goal: RH STG PAIN MANAGED AT OR BELOW PT'S PAIN GOAL 3 or less on scale 0-10  Outcome: Not Progressing Rate 7/10

## 2013-03-18 NOTE — Plan of Care (Signed)
Problem: RH BOWEL ELIMINATION Goal: RH STG MANAGE BOWEL WITH ASSISTANCE STG Manage Bowel with min Assistance.  Outcome: Not Progressing Refuses all interventions for constipation.

## 2013-03-19 ENCOUNTER — Inpatient Hospital Stay (HOSPITAL_COMMUNITY): Payer: 59 | Admitting: Anesthesiology

## 2013-03-19 ENCOUNTER — Inpatient Hospital Stay (HOSPITAL_COMMUNITY): Payer: 59 | Admitting: *Deleted

## 2013-03-19 ENCOUNTER — Encounter (HOSPITAL_COMMUNITY): Payer: 59 | Admitting: Anesthesiology

## 2013-03-19 ENCOUNTER — Inpatient Hospital Stay (HOSPITAL_COMMUNITY): Payer: 59

## 2013-03-19 ENCOUNTER — Encounter (HOSPITAL_COMMUNITY)
Admission: RE | Disposition: A | Discharge status: Home Health | Payer: Self-pay | Source: Intra-hospital | Attending: Physical Medicine & Rehabilitation

## 2013-03-19 ENCOUNTER — Encounter (HOSPITAL_COMMUNITY): Payer: Self-pay | Admitting: Certified Registered Nurse Anesthetist

## 2013-03-19 ENCOUNTER — Inpatient Hospital Stay (HOSPITAL_COMMUNITY): Payer: 59 | Admitting: Speech Pathology

## 2013-03-19 HISTORY — PX: HIP CLOSED REDUCTION: SHX983

## 2013-03-19 SURGERY — CLOSED REDUCTION, HIP
Anesthesia: General | Laterality: Right

## 2013-03-19 MED ORDER — PROPOFOL 10 MG/ML IV BOLUS
INTRAVENOUS | Status: DC | PRN
  Administered 2013-03-19: 120 mg via INTRAVENOUS

## 2013-03-19 MED ORDER — LIDOCAINE HCL (CARDIAC) 20 MG/ML IV SOLN
INTRAVENOUS | Status: DC | PRN
  Administered 2013-03-19: 40 mg via INTRAVENOUS

## 2013-03-19 MED ORDER — EPHEDRINE SULFATE 50 MG/ML IJ SOLN
INTRAMUSCULAR | Status: DC | PRN
  Administered 2013-03-19 (×4): 5 mg via INTRAVENOUS

## 2013-03-19 MED ORDER — GLYCOPYRROLATE 0.2 MG/ML IJ SOLN
INTRAMUSCULAR | Status: DC | PRN
  Administered 2013-03-19: 0.3 mg via INTRAVENOUS
  Administered 2013-03-19: 0.1 mg via INTRAVENOUS

## 2013-03-19 MED ORDER — NEOSTIGMINE METHYLSULFATE 1 MG/ML IJ SOLN
INTRAMUSCULAR | Status: DC | PRN
  Administered 2013-03-19: 2 mg via INTRAVENOUS

## 2013-03-19 MED ORDER — MIDAZOLAM HCL 5 MG/5ML IJ SOLN
INTRAMUSCULAR | Status: DC | PRN
  Administered 2013-03-19: 1 mg via INTRAVENOUS

## 2013-03-19 MED ORDER — SUCCINYLCHOLINE CHLORIDE 20 MG/ML IJ SOLN
INTRAMUSCULAR | Status: DC | PRN
  Administered 2013-03-19: 70 mg via INTRAVENOUS

## 2013-03-19 MED ORDER — HYDROCODONE-ACETAMINOPHEN 5-325 MG PO TABS
0.5000 | ORAL_TABLET | Freq: Four times a day (QID) | ORAL | Status: DC | PRN
  Administered 2013-03-19 (×2): 0.5 via ORAL
  Filled 2013-03-19 (×2): qty 1

## 2013-03-19 MED ORDER — HYDROCODONE-ACETAMINOPHEN 5-325 MG PO TABS
0.5000 | ORAL_TABLET | Freq: Four times a day (QID) | ORAL | Status: DC | PRN
  Administered 2013-03-21 – 2013-03-25 (×3): 1 via ORAL
  Filled 2013-03-19 (×3): qty 1

## 2013-03-19 MED ORDER — FENTANYL CITRATE 0.05 MG/ML IJ SOLN
INTRAMUSCULAR | Status: DC | PRN
  Administered 2013-03-19: 50 ug via INTRAVENOUS

## 2013-03-19 MED ORDER — LACTATED RINGERS IV SOLN
INTRAVENOUS | Status: DC | PRN
  Administered 2013-03-19: 19:00:00 via INTRAVENOUS

## 2013-03-19 MED ORDER — ONDANSETRON HCL 4 MG/2ML IJ SOLN
INTRAMUSCULAR | Status: DC | PRN
  Administered 2013-03-19: 4 mg via INTRAVENOUS

## 2013-03-19 MED ORDER — ROCURONIUM BROMIDE 100 MG/10ML IV SOLN
INTRAVENOUS | Status: DC | PRN
  Administered 2013-03-19: 10 mg via INTRAVENOUS

## 2013-03-19 SURGICAL SUPPLY — 3 items
DRSG AQUACEL AG ADV 3.5X10 (GAUZE/BANDAGES/DRESSINGS) IMPLANT
DRSG MEPILEX BORDER 4X12 (GAUZE/BANDAGES/DRESSINGS) ×1 IMPLANT
IMMOBILIZER KNEE 22 UNIV (SOFTGOODS) ×1 IMPLANT

## 2013-03-19 NOTE — Progress Notes (Addendum)
Physical Therapy Session Note  Patient Details  Name: Kayla [name] MRN: [id number] Date of Birth: Mar 12, 1951  Today's Date: 03/19/2013 Time: 1020-1040 Time Calculation (min): 20 min - skilled PT missed due to pain in R LE  Short Term Goals: Week 1:  PT Short Term Goal 1 (Week 1): Pt to consistently perform bed mobility with supervision using rails with HOB flat. PT Short Term Goal 2 (Week 1): Pt to perform bed<>chair transfer with min A and rolling walker. PT Short Term Goal 3 (Week 1): Pt to perform gait x50' with supervision using LRAD. PT Short Term Goal 4 (Week 1): Pt to negotiate 4 steps with R rail requiring mod A.  Skilled Therapeutic Interventions/Progress Updates:  Pt resting in bed upon arrival, willing to attempt PT, despite pain with movement. Discussed with nursing, and OK for therapy as tolerable; pre-medicated.  Removed bil boots and R KI, pt's pain increases from 2 to 10/10 with slight movement in R mid-thigh. Reports she has had knee pain since the surgery, but this is new.   Instructed pt in ankle pumps, quad sets, glute sets, and LLE heel slides x20. Attempted RLE heel slides, but pt unable to tolerate 2 inches of movement. Performed passive R hip ABD/ADD in minimal range. Pt very uncomfortable when R LE positioning in neutral towards ER, as it is turned internally rotated at rest.   Pt able to recall 3/3 hip precautions, and but preferring to wait until after x-ray to push through pain of more mobility. Pt left with boots and KI on, all needs in reach. Replaced ice packs with hot packs on R mid-lateral thigh.      Therapy Documentation Precautions:  Precautions Precautions: Fall;Posterior Hip;Other (comment) Precaution Booklet Issued: No Precaution Comments: Pt able to independently verbalize all posterior hip precautions. Required Braces or Orthoses: Knee Immobilizer - Right Knee Immobilizer - Right: On at all times;Other (comment) Restrictions Weight  Bearing Restrictions: Yes RLE Weight Bearing: Non weight bearing Other Position/Activity Restrictions: per Marissa Nestle, PA    Pain: Pain Assessment Pain Assessment: 0-10 Pain Score: 2  (becomes a 10 with activity) Pain Type: Acute pain Pain Location: Leg Pain Orientation: Right Pain Radiating Towards: thigh Pain Descriptors / Indicators: Jabbing Pain Frequency: Intermittent Pain Onset: With Activity Patients Stated Pain Goal: 2 Pain Intervention(s): Medication (See eMAR)    See FIM for current functional status  Therapy/Group: Individual Therapy  Clydene Laming, PT, DPT  03/19/2013, 11:06 AM

## 2013-03-19 NOTE — Brief Op Note (Signed)
03/15/2013 - 03/19/2013  7:48 PM  PATIENT:  Kayla [name]  62 y.o. female  PRE-OPERATIVE DIAGNOSIS:  dislocated right total hip  POST-OPERATIVE DIAGNOSIS:  dislocated right total hip  PROCEDURE:  Procedure(s): CLOSED REDUCTION HIP (Right)  SURGEON:  Surgeon(s) and Role:    * Javier Docker, MD - Primary  PHYSICIAN ASSISTANT:   ASSISTANTSJulien Girt   ANESTHESIA:   general  EBL:     BLOOD ADMINISTERED:none  DRAINS: none   LOCAL MEDICATIONS USED:  NONE  SPECIMEN:  No Specimen  DISPOSITION OF SPECIMEN:  N/A  COUNTS:  YES  TOURNIQUET:  * No tourniquets in log *  DICTATION: .Other Dictation: Dictation Number [id number]  PLAN OF CARE: D/C to Rehab  PATIENT DISPOSITION:  PACU - hemodynamically stable.   Delay start of Pharmacological VTE agent (>24hrs) due to surgical blood loss or risk of bleeding: no

## 2013-03-19 NOTE — Consult Note (Signed)
Reason for Consult:Right hip dislocation Referring Physician: [name] is an 62 y.o. female.  HPI: Dislocated right hip. Sat down.  Past Medical History  Diagnosis Date  . Multiple sclerosis   . Unspecified hypothyroidism   . Other voice and resonance disorders   . Other diseases of vocal cords   . Cervical cancer 2005    Past Surgical History  Procedure Laterality Date  . Vesicovaginal fistula closure w/ tah    . Hip arthroplasty Right 03/01/2013    Procedure: ARTHROPLASTY BIPOLAR HIP;  Surgeon: Shelda Pal, MD;  Location: [facility];  Service: Orthopedics;  Laterality: Right;  . Hip closed reduction Right 03/12/2013    Procedure: CLOSED REDUCTION HIP;  Surgeon: [name], MD;  Location: [facility];  Service: Orthopedics;  Laterality: Right;    Family History  Problem Relation Age of Onset  . Colon cancer Father     Social History:  reports that she has never smoked. She has never used smokeless tobacco. She reports that she drinks alcohol. She reports that she does not use illicit drugs.  Allergies:  Allergies  Allergen Reactions  . Methylprednisolone Other (See Comments)    Pt states cannot take any steroids b/[name] they cause a decrease in her heart rate.  . Morphine And Related     Excessive sedation.   . Oxycodone-Acetaminophen     REACTION: unspecified  . Oxycodone-Acetaminophen     Rash head to toe  . Percocet [Oxycodone-Acetaminophen]     hallucinations  . Phenytoin     REACTION: rash  . Zanaflex [Tizanidine Hydrochloride]   . Phenytoin Sodium Extended Rash    Medications: I have reviewed the patient's current medications.  No results found for this or any previous visit (from the past 48 hour(s)).  Dg Hip 1 View Right  03/19/2013   CLINICAL DATA:  Right hip pain. Status post right hip replacement 02/23/2013.  EXAM: RIGHT HIP - 1 VIEW  COMPARISON:  Plain film of the right hip 03/12/2013.  FINDINGS: [name] right total hip arthroplasty  is posteriorly dislocated. No fracture is identified. Soft tissue calcifications in the buttocks are noted.  IMPRESSION: Posterior dislocation of the patient's right hip hemiarthroplasty.  These results will be called to the ordering clinician or representative by the Radiologist Assistant, and communication documented in the PACS Dashboard.   Electronically Signed   By: Drusilla Kanner M.D.   On: 03/19/2013 14:58    Review of Systems  Musculoskeletal: Positive for joint pain.  Neurological: Positive for focal weakness.  All other systems reviewed and are negative.   Blood pressure 113/78, pulse 68, temperature 98.7 F (37.1 [name]), temperature source Oral, resp. rate 18, height 5\' 3"  (1.6 m), weight 65.5 kg (144 lb 6.4 oz), SpO2 97.00%. Physical Exam  Constitutional: She is oriented to person, place, and time. She appears well-developed.  HENT:  Head: Normocephalic.  Eyes: Pupils are equal, round, and reactive to light.  Neck: Normal range of motion.  Cardiovascular: Normal rate.   Respiratory: Effort normal.  GI: Soft.  Musculoskeletal: She exhibits tenderness.  Short and IR right leg. 1+ pulse. No DVT. Weak DF and EHL  Neurological: She is alert and oriented to person, place, and time.  Skin: Skin is warm and dry.  Psychiatric: She has a normal mood and affect.    Assessment/Plan: Dislocated right hemiarthroplasty with partial foot drop.  Plan closed reduction emergent to reduce compression of sciatic nerve and relocate hip. Risks  discussed.  Kayla [name] 03/19/2013, 7:07 PM

## 2013-03-19 NOTE — Progress Notes (Signed)
Called with radiology report on right hip xray. I have reviewed the xray myself, and it shows a posterior dislocation. Unsure at what point this took place as hip precautions were followed strictly and no "give away" or "pop" was noted by the patient. She just was very sore in her right thigh after therapies yesterday evening and overnight which prompted the xray today. Have placed the patient on bedrest for now, and I have increased her hydrocodone slightly. She is comfortable when she's at rest.   I have put in a call to [facility] orthopedics for further surgical decision making.    Ranelle Oyster, MD, [facility] Physical Medicine & Rehabilitation

## 2013-03-19 NOTE — Preoperative (Signed)
Beta Blockers   Reason not to administer Beta Blockers:Not Applicable 

## 2013-03-19 NOTE — Progress Notes (Signed)
Speech Language Pathology Daily Session Note  Patient Details  Name: Kayla [name] MRN: [id number] Date of Birth: 12-20-1950  Today's Date: 03/19/2013 Time: 1345-1430 Time Calculation (min): 45 min  Short Term Goals: Week 1: SLP Short Term Goal 1 (Week 1): Pt will utilize safe swallow strategies with Dys 3 textures and thin liquids with supervision verbal cues to reduce overt s/s of aspiration SLP Short Term Goal 2 (Week 1): Pt will consume trials of regular textures with Min cues for use of safe swallow strategies to assess readiness for advancement SLP Short Term Goal 3 (Week 1): Pt will demonstrate adequate diaphragmatic breathing for increased breath support at the phrase level with Min cues  Skilled Therapeutic Interventions: Skilled intervention provided with focus on dysphagia and cognitive goals. Pt attended to all tasks willingly without need for redirection. Pt noted with decreased swallow safety. Pt required mod-max verbal and visual cues to utilize safe swallow strategies correctly. Pt consumed snack of regular texture and thin liquids with no overt s/s of aspiration. Pt noted with appropriate bolus manipulation with trial of regular texture. Pt noted with no oral stasis and OTT as WFL. Pt able to recall 1 swallow strategy (chin tuck) independently. Pt noted with decreased problem solving. Pt was 90% accurate with 6-step sequencing task, min verbal cues provided.    FIM:  Comprehension Comprehension Mode: Auditory Comprehension: 6-Follows complex conversation/direction: With extra time/assistive device Expression Expression Mode: Verbal Expression: 5-Expresses complex 90% of the time/cues < 10% of the time Social Interaction Social Interaction: 6-Interacts appropriately with others with medication or extra time (anti-anxiety, antidepressant). Problem Solving Problem Solving: 5-Solves basic 90% of the time/requires cueing < 10% of the time Memory Memory: 6-More than  reasonable amt of time FIM - Eating Eating Activity: 5: Supervision/cues  Pain Pain Assessment Pain Assessment: No/denies pain Pain Score: 8  Pain Type: Acute pain Pain Location: Leg Pain Orientation: Right Pain Descriptors / Indicators: Jabbing Pain Frequency: Intermittent Pain Onset: With Activity Patients Stated Pain Goal: 2 Pain Intervention(s): Medication (See eMAR)  Therapy/Group: Individual Therapy  Deshay Blumenfeld, Kara Pacer 03/19/2013, 3:18 PM

## 2013-03-19 NOTE — Anesthesia Procedure Notes (Signed)
Procedure Name: Intubation Date/Time: 03/19/2013 7:23 PM Performed by: Reine Just Pre-anesthesia Checklist: Patient identified, Timeout performed, Emergency Drugs available, Suction available and Patient being monitored Patient Re-evaluated:Patient Re-evaluated prior to inductionOxygen Delivery Method: Circle system utilized and Simple face mask Preoxygenation: Pre-oxygenation with 100% oxygen Intubation Type: IV induction Ventilation: Mask ventilation without difficulty Laryngoscope Size: Miller and 2 Grade View: Grade I Tube type: Oral Tube size: 7.0 mm Number of attempts: 1 Airway Equipment and Method: Patient positioned with wedge pillow and Stylet Placement Confirmation: ETT inserted through vocal cords under direct vision,  positive ETCO2 and breath sounds checked- equal and bilateral Secured at: 22 cm Tube secured with: Tape Dental Injury: Teeth and Oropharynx as per pre-operative assessment

## 2013-03-19 NOTE — H&P (View-Only) (Signed)
Ortho on-call consult  62 yo female who underwent right hip hemiarthroplasty by Dr. Olin on 12/9 and has been on rehab unit for a few days now. Was on commode earlier this evening and leaned forward with immediate hip pain and inability to bear weight. She had significant right hip pain. Radiographs demonstrated right hip dislocation. On exam she can move her toes and sensation is intact. Her leg is shortened and internally rotated.  I discussed the situation with the patient and her husband and they have opted for closed reduction. We will do this in the operating room ASAP. 

## 2013-03-19 NOTE — Transfer of Care (Signed)
Immediate Anesthesia Transfer of Care Note  Patient: Kayla [name]  Procedure(s) Performed: Procedure(s): CLOSED REDUCTION HIP (Right)  Patient Location: PACU  Anesthesia Type:General  Level of Consciousness: awake and alert   Airway & Oxygen Therapy: Patient Spontanous Breathing and Patient connected to nasal cannula oxygen  Post-op Assessment: Report given to PACU RN and Post -op Vital signs reviewed and stable  Post vital signs: Reviewed and stable  Complications: No apparent anesthesia complications

## 2013-03-19 NOTE — Progress Notes (Signed)
1526 RN notified regarding x-ray result.  Dr. Riley Kill notified and patient placed on bedrest.  1615 RN contacted by Kenard Gower, PA to schedule patient for surgery.  RN awaiting further orders.  1630 RN obtained consent for Right Hip Closed Reduction Arthoplasty.  Patient is NPO and surgery scheduled this evening with Dr. Jillyn Hidden (ortho surgeon on-call). Patient verbalizes understanding, no further questions asked.

## 2013-03-19 NOTE — Anesthesia Preprocedure Evaluation (Signed)
Anesthesia Evaluation  Patient identified by MRN, date of birth, ID band Patient awake    Reviewed: Allergy & Precautions, H&P , NPO status , Patient's Chart, lab work & pertinent test results  Airway Mallampati: II      Dental   Pulmonary shortness of breath, pneumonia -,          Cardiovascular negative cardio ROS      Neuro/Psych    GI/Hepatic negative GI ROS, Neg liver ROS,   Endo/Other  Hypothyroidism   Renal/GU negative Renal ROS     Musculoskeletal   Abdominal   Peds  Hematology   Anesthesia Other Findings   Reproductive/Obstetrics                           Anesthesia Physical Anesthesia Plan  ASA: III  Anesthesia Plan: General   Post-op Pain Management:    Induction:   Airway Management Planned:   Additional Equipment:   Intra-op Plan:   Post-operative Plan:   Informed Consent: I have reviewed the patients History and Physical, chart, labs and discussed the procedure including the risks, benefits and alternatives for the proposed anesthesia with the patient or authorized representative who has indicated his/her understanding and acceptance.   Dental advisory given  Plan Discussed with: CRNA, Anesthesiologist and Surgeon  Anesthesia Plan Comments:         Anesthesia Quick Evaluation

## 2013-03-19 NOTE — Progress Notes (Signed)
Occupational Therapy Note  Patient Details  Name: Kayla [name] MRN: [id number] Date of Birth: 02/18/1951 Today's Date: 03/19/2013  Time: 0800-0900  1xt session Pain:right thigh pain= 10/10 with movement; 4/10 with no movement. Individual session      1st session:  Pt. Lying in bed with complaints of tright thigh pain.  Pt. Agreed to sponge bath in bed.  Pt. Provided set up and pt sponged in supine.  Pt unable to do any LB movements during bath.     Time: 1430-1500   (30 min) 2nd session Pain :right thigh pain= 10/10 with movement; 4/10 with no movement. Individual session  Addressed UE exercises in bed using 2# weights.  Pt. Tolerated session well.  Left in bed with call bell in reach   Humberto Seals 03/19/2013, 8:25 AM

## 2013-03-19 NOTE — Anesthesia Postprocedure Evaluation (Signed)
  Anesthesia Post-op Note  Patient: Kayla [name]  Procedure(s) Performed: Procedure(s): CLOSED REDUCTION HIP (Right)  Patient Location: PACU  Anesthesia Type:General  Level of Consciousness: awake  Airway and Oxygen Therapy: Patient Spontanous Breathing  Post-op Pain: mild  Post-op Assessment: Post-op Vital signs reviewed  Post-op Vital Signs: Reviewed  Complications: No apparent anesthesia complications

## 2013-03-19 NOTE — Interval H&P Note (Signed)
History and Physical Interval Note:  03/19/2013 7:02 PM  Kayla [name]  has presented today for surgery, with the diagnosis of dislocated right total hip  The various methods of treatment have been discussed with the patient and family. After consideration of risks, benefits and other options for treatment, the patient has consented to  Procedure(s): CLOSED REDUCTION HIP (Right) as a surgical intervention .  [name] history has been reviewed, patient examined, no change in status, stable for surgery.  I have reviewed the patient's chart and labs.  Questions were answered to the patient's satisfaction.     [name]

## 2013-03-19 NOTE — Progress Notes (Addendum)
Subjective/Complaints: Right thigh sore after therapies yesterday. No pop or concerning movement noted. Pain is on mid lateral thigh A 12 point review of systems has been performed and if not noted above is otherwise negative.   Objective: Vital Signs: Blood pressure 157/98, pulse 66, temperature 97.3 F (36.3 C), temperature source Axillary, resp. rate 16, height 5\' 3"  (1.6 m), weight 65.5 kg (144 lb 6.4 oz), SpO2 100.00%. No results found. No results found for this basename: WBC, HGB, HCT, PLT,  in the last 72 hours No results found for this basename: NA, K, CL, CO, GLUCOSE, BUN, CREATININE, CALCIUM,  in the last 72 hours CBG (last 3)  No results found for this basename: GLUCAP,  in the last 72 hours  Wt Readings from Last 3 Encounters:  03/15/13 65.5 kg (144 lb 6.4 oz)  03/13/13 63.2 kg (139 lb 5.3 oz)  03/09/13 63.05 kg (139 lb)    Physical Exam:  Constitutional: She is oriented to person, place, and time. She appears well-developed and well-nourished.  HENT:  Head: Normocephalic and atraumatic.  Eyes: Pupils are equal, round, and reactive to light.  Neck: Neck supple.  Cardiovascular: Normal rate and regular rhythm.  Respiratory: Effort normal. She has normal breath sounds.  GI: Soft. Bowel sounds are normal. She exhibits no distension. There is no tenderness. There is no rebound and no guarding.  Musculoskeletal:  Right hip wound is clean and intact with removable dressing still in place, clinging.  Right leg is tender at the mid femur level, sensitive to palpation and gentle rom Neurological: She is alert and oriented to person, place, and time. Coordination abnormal.  Upper ext 5/5 bilaterally. Left lower 3+ HF to 4- KE, to 4 ankle. RLE is 1+ HF, 2 KE, 3/5 ADF, 3+ APF. No gross sensory deficits were appreciated.  Skin: Skin is warm and dry.  Psychiatric: she is anxious   Assessment/Plan: 1. Functional deficits secondary to right FNF s/p hip hemi and subsequent  dislocation. Pt also has MS. Her conditions require 3+ hours per day of interdisciplinary therapy in a comprehensive inpatient rehab setting. Physiatrist is providing close team supervision and 24 hour management of active medical problems listed below. Physiatrist and rehab team continue to assess barriers to discharge/monitor patient progress toward functional and medical goals. FIM: FIM - Bathing Bathing Steps Patient Completed: Chest;Right Arm;Left Arm;Left upper leg;Abdomen;Right upper leg;Front perineal area;Buttocks Bathing: 4: Min-Patient completes 8-9 30f 10 parts or 75+ percent  FIM - Upper Body Dressing/Undressing Upper body dressing/undressing steps patient completed: Thread/unthread left sleeve of pullover shirt/dress;Put head through opening of pull over shirt/dress;Pull shirt over trunk;Thread/unthread right sleeve of pullover shirt/dresss Upper body dressing/undressing: 5: Set-up assist to: Obtain clothing/put away FIM - Lower Body Dressing/Undressing Lower body dressing/undressing steps patient completed: Thread/unthread right underwear leg;Thread/unthread left underwear leg;Pull underwear up/down;Thread/unthread left pants leg;Thread/unthread right pants leg Lower body dressing/undressing: 2: Max-Patient completed 25-49% of tasks  FIM - Hotel manager Devices: Grab bar or rail for support Toileting: 0: Activity did not occur  FIM - Diplomatic Services operational officer Devices: Art gallery manager Transfers: 0-Activity did not occur  FIM - Banker Devices: Bed rails;Walker Bed/Chair Transfer: 0: Activity did not occur  FIM - Locomotion: Wheelchair Distance: 150 Locomotion: Wheelchair: 0: Activity did not occur FIM - Locomotion: Ambulation Locomotion: Ambulation Assistive Devices: Designer, industrial/product Ambulation/Gait Assistance: 4: Min guard Locomotion: Ambulation: 1: Travels less than 50 ft with minimal assistance  (Pt.>75%)  Comprehension Comprehension Mode:  Auditory Comprehension: 6-Follows complex conversation/direction: With extra time/assistive device  Expression Expression Mode: Verbal Expression: 5-Expresses basic needs/ideas: With no assist  Social Interaction Social Interaction: 6-Interacts appropriately with others with medication or extra time (anti-anxiety, antidepressant).  Problem Solving Problem Solving: 5-Solves basic 90% of the time/requires cueing < 10% of the time  Memory Memory: 6-More than reasonable amt of time  Medical Problem List and Plan:  1. DVT Prophylaxis/Anticoagulation: Pharmaceutical: Lovenox  2. Pain Management: Prn tylenol and/or baclofen.  -tylenol -will use the hydrocodone at night to help with sleep and pain -Will add LOW dose hydrocodone for pain during the day  -continue ice/immobilization. Appears to be more muscle/soft tissue (mid left thigh)  -CHECK HIP XRAY TODAY ONE VIEW AS A PRECAUTION 3. Mood: Provide ego support for high levels of anxiety. Educate on hip precautions.   -anxiety not helping with hip pain 4. Neuropsych: This patient is capable of making decisions on her own behalf.  5. MS with neurogenic bladder: Recheck UA/UCS negative   -IC cath prn. Added urecholine with some improvement--incontinent at times--- -needs timed voiding schedule -augment bowel meds/program---review program with pt (she needs to buy in) 6. Hypoxia: Aspiration PNA has been treated.  -Encourage IS with flutter valve.  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  -wean O2 as able 8. Leukocytosis: resolving. Wbc's 4k today 9. ABLA: Has improved post transfusion.  LOS (Days) 4 A FACE TO FACE EVALUATION WAS PERFORMED  Kayla [name] T 03/19/2013 7:13 AM

## 2013-03-20 ENCOUNTER — Inpatient Hospital Stay (HOSPITAL_COMMUNITY): Payer: 59 | Admitting: *Deleted

## 2013-03-20 ENCOUNTER — Inpatient Hospital Stay (HOSPITAL_COMMUNITY): Payer: 59 | Admitting: Physical Therapy

## 2013-03-20 NOTE — Op Note (Signed)
NAMEIVOREE, FELMLEE NO.:  [id number]  MEDICAL RECORD NO.:  [id number]  LOCATION:  [id number]                        FACILITY:  [facility]  PHYSICIAN:  Jene Every, M.D.    DATE OF BIRTH:  [birth year]  DATE OF PROCEDURE:  03/19/2013 DATE OF DISCHARGE:                              OPERATIVE REPORT   PREOPERATIVE DIAGNOSIS:  Dislocated right unipolar hemiarthroplasty on the right.  POSTOPERATIVE DIAGNOSIS:  Dislocated right unipolar hemiarthroplasty on the right.  PROCEDURE PERFORMED:  Closed reduction under anesthesia, right dislocated unipolar hip replacement.  ANESTHESIA:  General.  ASSISTANT:  Alexzandrew L. Perkins, P.A.C.  HISTORY:  A 62 year old, status post hemiarthroplasty and a subsequent dislocation and re-dislocated apparently again in rehab.  Sitting low, internally rotated and dislocated.  She was indicated for closed reduction.  She also has a history of partial foot drop from sciatica. We discussed emergent relocation and take pressure off the sciatic nerve and relocate the hip.  Risks and benefits were discussed including bleeding, infection, redislocation, and need for revision, etc.  TECHNIQUE:  With the patient in supine position after the induction of adequate general anesthesia, the leg which was shortened and internally rotated, we stabilized the pelvis and placed a gentle longitudinal traction with internal rotation.  I felt a reduction of the dislocation and correction of her leg-length discrepancy.  She was in neutral, slightly internally rotated, which was her bias.  Under x-ray in the AP and frog leg demonstrated concentric reduction of the hip with no fracture noted in the femur or the acetabulum.  She was stable to flexion to 90 degrees, external rotation, full extension, and internal rotation.  We then placed her in a knee immobilizer.  She had good pulses distally, pillow between the legs.  She was extubated without difficulty,  and transported to the recovery room in satisfactory condition.  The patient tolerated the procedure well.  No complications.  No blood loss.  Assistant, Alexzandrew L. Perkins, P.A.C.     Jene Every, M.D.    Cordelia Pen  D:  03/19/2013  T:  03/20/2013  Job:  [id number]

## 2013-03-20 NOTE — Progress Notes (Signed)
Occupational Therapy Note  Patient Details  Name: Kayla [name] MRN: [id number] Date of Birth: 1950-05-19 Today's Date: 03/20/2013  Time:  1030-  1115  ( ) 1st session Pain:  2/10  Right hip Individual session  1st session:  Pt. Lying in bed upon OT arrival.  Pt. Did not want to get up but agreed to bathe and don hospital gown in bed.  Pt performed UE exercises afterwards.  Left in bed with call bell and phone with in reach.    Time:  1300-1340 ( ) 2nd session Pain: 2 /10  Right hip Individual session  Pt. Refused to sit EOB.  Performed UE exercises using 2 # weights.  Did incentive spirometer for 10 breaths at 750 ml.    Pt.family present (Husband, Homero Fellers and daughter, Santina Evans).  Provided written handout of UE exercises.  Left 2 # weights in room.   Pt. Left in be with call bell and phone in reach.      Humberto Seals 03/20/2013, 10:37 AM

## 2013-03-20 NOTE — Progress Notes (Signed)
Subjective/Complaints: Hip feeling much better today. Was able to sleepl. Pain under control. Using tylenol primarily. A 12 point review of systems has been performed and if not noted above is otherwise negative.   Objective: Vital Signs: Blood pressure 98/62, pulse 66, temperature 98.5 F (36.9 C), temperature source Oral, resp. rate 16, height 5\' 3"  (1.6 m), weight 65.5 kg (144 lb 6.4 oz), SpO2 100.00%. Dg Hip 1 View Right  03/19/2013   CLINICAL DATA:  Right hip pain. Status post right hip replacement 02/23/2013.  EXAM: RIGHT HIP - 1 VIEW  COMPARISON:  Plain film of the right hip 03/12/2013.  FINDINGS: [name] right total hip arthroplasty is posteriorly dislocated. No fracture is identified. Soft tissue calcifications in the buttocks are noted.  IMPRESSION: Posterior dislocation of the patient's right hip hemiarthroplasty.  These results will be called to the ordering clinician or representative by the Radiologist Assistant, and communication documented in the PACS Dashboard.   Electronically Signed   By: Drusilla Kanner M.D.   On: 03/19/2013 14:58   Dg Hip Operative Right  03/19/2013   CLINICAL DATA:  Closed right hip prosthesis dislocation reduction.  EXAM: DG OPERATIVE RIGHT HIP  TECHNIQUE: A single spot fluoroscopic AP image of the right hip is submitted.  COMPARISON:  03/19/2013 at 1133 hr  FINDINGS: For portable views show reduction of the right hip prosthesis into normal alignment with the acetabulum. There is no acute fracture or evidence of loosening of the prosthesis.  IMPRESSION: Successful reduction of the right hip prosthesis dislocation.   Electronically Signed   By: Amie Portland M.D.   On: 03/19/2013 20:32   No results found for this basename: WBC, HGB, HCT, PLT,  in the last 72 hours No results found for this basename: NA, K, CL, CO, GLUCOSE, BUN, CREATININE, CALCIUM,  in the last 72 hours CBG (last 3)  No results found for this basename: GLUCAP,  in the last 72  hours  Wt Readings from Last 3 Encounters:  03/15/13 65.5 kg (144 lb 6.4 oz)  03/15/13 65.5 kg (144 lb 6.4 oz)  03/13/13 63.2 kg (139 lb 5.3 oz)    Physical Exam:  Constitutional: She is oriented to person, place, and time. She appears well-developed and well-nourished.  HENT:  Head: Normocephalic and atraumatic.  Eyes: Pupils are equal, round, and reactive to light.  Neck: Neck supple.  Cardiovascular: Normal rate and regular rhythm.  Respiratory: Effort normal. She has normal breath sounds.  GI: Soft. Bowel sounds are normal. She exhibits no distension. There is no tenderness. There is no rebound and no guarding.  Musculoskeletal:  Right hip wound is clean and intact with removable dressing still in place, clinging.  Hip less tender and in better position. Neurological: She is alert and oriented to person, place, and time. Coordination abnormal.  Upper ext 5/5 bilaterally. Left lower 3+ HF to 4- KE, to 4 ankle. RLE is 1+ HF, 2 KE, 3/5 ADF, 3+ APF. No gross sensory deficits were appreciated.  Skin: Skin is warm and dry.  Psychiatric: she is anxious   Assessment/Plan: 1. Functional deficits secondary to right FNF s/p hip hemi and subsequent dislocation. Pt also has MS. Her conditions require 3+ hours per day of interdisciplinary therapy in a comprehensive inpatient rehab setting. Physiatrist is providing close team supervision and 24 hour management of active medical problems listed below. Physiatrist and rehab team continue to assess barriers to discharge/monitor patient progress toward functional and medical goals.  Given pt's recurrent  dislocation (without obvious causative "incident"), will need to be very conservative going forward. May need hip abduction brace---will need to discuss with ortho. Would keep in KI today pending decision.   FIM: FIM - Bathing Bathing Steps Patient Completed: Chest;Right Arm;Left Arm;Left upper leg;Abdomen;Right upper leg;Front perineal  area;Buttocks Bathing: 4: Min-Patient completes 8-9 92f 10 parts or 75+ percent  FIM - Upper Body Dressing/Undressing Upper body dressing/undressing steps patient completed: Thread/unthread left sleeve of pullover shirt/dress;Put head through opening of pull over shirt/dress;Pull shirt over trunk;Thread/unthread right sleeve of pullover shirt/dresss Upper body dressing/undressing: 5: Set-up assist to: Obtain clothing/put away FIM - Lower Body Dressing/Undressing Lower body dressing/undressing steps patient completed: Thread/unthread right underwear leg;Thread/unthread left underwear leg;Pull underwear up/down;Thread/unthread left pants leg;Thread/unthread right pants leg Lower body dressing/undressing: 2: Max-Patient completed 25-49% of tasks  FIM - Hotel manager Devices: Grab bar or rail for support Toileting: 0: Activity did not occur  FIM - Diplomatic Services operational officer Devices: Art gallery manager Transfers: 0-Activity did not occur  FIM - Banker Devices: Bed rails;Walker Bed/Chair Transfer: 0: Activity did not occur  FIM - Locomotion: Wheelchair Distance: 150 Locomotion: Wheelchair: 0: Activity did not occur FIM - Locomotion: Ambulation Locomotion: Ambulation Assistive Devices: Designer, industrial/product Ambulation/Gait Assistance: 4: Min guard Locomotion: Ambulation: 0: Activity did not occur  Comprehension Comprehension Mode: Auditory Comprehension: 6-Follows complex conversation/direction: With extra time/assistive device  Expression Expression Mode: Verbal Expression: 5-Expresses complex 90% of the time/cues < 10% of the time  Social Interaction Social Interaction: 6-Interacts appropriately with others with medication or extra time (anti-anxiety, antidepressant).  Problem Solving Problem Solving: 5-Solves basic 90% of the time/requires cueing < 10% of the time  Memory Memory: 6-More than reasonable amt of  time  Medical Problem List and Plan:  1. DVT Prophylaxis/Anticoagulation: Pharmaceutical: Lovenox  2. Pain Management: Prn tylenol and/or baclofen.  -tylenol -will use the hydrocodone at night to help with sleep and pain -low dose hydrocodone prn if needed. 3. Mood: Provide ego support for high levels of anxiety. Educate on hip precautions.   -anxiety not helping with hip pain 4. Neuropsych: This patient is capable of making decisions on her own behalf.  5. MS with neurogenic bladder: Recheck UA/UCS negative   -IC cath prn. Added urecholine with some improvement--incontinent at times--- -needs timed voiding schedule -augment bowel meds/program---review program with pt (she needs to buy in) 6. Hypoxia: Aspiration PNA has been treated.  -Encourage IS with flutter valve.  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  -wean O2 as able 8. Leukocytosis: resolved 9. ABLA: Has improved post transfusion.  LOS (Days) 5 A FACE TO FACE EVALUATION WAS PERFORMED  SWARTZ,ZACHARY T 03/20/2013 7:37 AM

## 2013-03-20 NOTE — Progress Notes (Signed)
Physical Therapy Note  Patient Details  Name: AMEERA TIGUE MRN: [id number] Date of Birth: [birth year] Today's Date: 03/20/2013  4098-1191 (25 minutes) individual (pt missed 35 minutes of PT session / pt states she does not want to get out of bed today /post dislocation RT hip THA Pain: 1/10 - 2/10 RT hip / premedicated/ ice pack applied Treatment: Pt in bed with knee immobilizer RT knee in place + PRAFO to prevent RT hip IR; pt performed X 20 the following - bilateral LE ankle pumps, bilateral LE quad sets, glut sets . All needs within reach prior to leaving room.   1445-1500 (15 minutes) individual (pt missed 30 minutes secondary to reason in previous note)  Pain: no reported pain Treatment: Therapeutic exercises as above X 20.    Gianni Fuchs,JIM 03/20/2013, 9:21 AM

## 2013-03-20 NOTE — Progress Notes (Signed)
Returned from OR at 2050, BP-94/62. Paged Dr. Riley Kill related to BP and question about giving multi scheduled meds. Orders to hold scheduled meds tonight only, finish current fluids and encourage PO intake. Patient without complaint of. Ice to right hip and dressings CD & I. Continuous pulse ox initiated. O2 at 2L/M via [place]. 2132 bladder scan=120. At 2310 I & O cath=300 of amber urine. Moderate, soft, incontinent stool. At 0215 complained of feeling SOB with audible wheeze-PRN neb treatment given with relief. Prevalon boots applied bilaterally. Right knee immobilizer in place. PRN tylenol given at 2233 & 0418 with relief. Alfredo Martinez A

## 2013-03-21 ENCOUNTER — Inpatient Hospital Stay (HOSPITAL_COMMUNITY): Payer: 59 | Admitting: Physical Therapy

## 2013-03-21 ENCOUNTER — Encounter (HOSPITAL_COMMUNITY): Payer: Self-pay | Admitting: Specialist

## 2013-03-21 ENCOUNTER — Inpatient Hospital Stay (HOSPITAL_COMMUNITY): Payer: 59

## 2013-03-21 DIAGNOSIS — S72009A Fracture of unspecified part of neck of unspecified femur, initial encounter for closed fracture: Secondary | ICD-10-CM

## 2013-03-21 DIAGNOSIS — T84029A Dislocation of unspecified internal joint prosthesis, initial encounter: Secondary | ICD-10-CM

## 2013-03-21 MED ORDER — ALBUTEROL SULFATE (2.5 MG/3ML) 0.083% IN NEBU: 2.5000 mg | INHALATION_SOLUTION | RESPIRATORY_TRACT | Status: DC | PRN

## 2013-03-21 NOTE — Plan of Care (Signed)
Problem: RH BLADDER ELIMINATION Goal: RH STG MANAGE BLADDER WITH ASSISTANCE STG Manage Bladder With min Assistance  Outcome: Not Progressing Pt requiring I&O cath     

## 2013-03-21 NOTE — Plan of Care (Signed)
Problem: RH BOWEL ELIMINATION Goal: RH STG MANAGE BOWEL WITH ASSISTANCE STG Manage Bowel with min. Assistance.  Outcome: Not Progressing Incontinent      

## 2013-03-21 NOTE — Progress Notes (Signed)
Physical Therapy Session Note  Patient Details  Name: Kayla [name] MRN: [id number] Date of Birth: 1950-12-26  Today's Date: 03/21/2013 Time: 4098-1191 and 1430-1530 Time Calculation (min):45 min and 60 min  Short Term Goals: Week 1:  PT Short Term Goal 1 (Week 1): Pt to consistently perform bed mobility with supervision using rails with HOB flat. PT Short Term Goal 2 (Week 1): Pt to perform bed<>chair transfer with min A and rolling walker. PT Short Term Goal 3 (Week 1): Pt to perform gait x50' with supervision using LRAD. PT Short Term Goal 4 (Week 1): Pt to negotiate 4 steps with R rail requiring mod A.  Skilled Therapeutic Interventions/Progress Updates:    Treatment Session 1: Pt received seated in w/c; agreeable to therapy. Session focused on increasing gait stability, improving pt independence with sit>stand transfer while adhering to R posterior hip precautions. Doffed R knee immobilizer. Donned ACE bandages x2 at RLE to facilitate R hip external rotation. Gait x27', x30' in controlled environment with rolling walker, min guard, intermittent manual facilitation of R hip rotation to neutral, tactile/verbal cueing for postural normalization. Seated rest breaks between with SpO2 96-97% post-gait. Pt with difficulty performing sit>stand from w/c to rolling walker secondary to pt apprehension. Therapist departed with pt seated in w/c with knee immoblizer on RLE and all needs within reach.   Treatment Session 2: Pt received semi-reclined in bed; agreeable to therapy. Supine>sit with min A for RLE management, HOB elevated, use of single rail. Per pt, orthotist present to assess/measure pt for R hip abduction orthosis to prevent dislocation. Session focused on gait training, functional transfers, and w/c mobility. Pt performed w/c mobility x50' in controlled environment with bilat UE's requiring min A, HOH for technique with turning, and increased time. Doffed R knee immobilizer. Donned ACE  bandages x2 at RLE to facilitate R hip external rotation. Sit>stand with mod A, manual approximation at R knee to promote hip external rotation. Gait x68', x15' in controlled environment with min guard, cueing and manual facilitation as described above. Sit>supine with mod A for bilat LE management.  Attempted to elevated w/c seat using pillow to increase height, decrease risk of R hip dislocation; however, compliance of pillow increase difficulty of sit>stand transfer. Therapist retrieved additional w/c cushion to increase w/c seat height; will continue to assess. Cold pack with compression at R distal femur x20 minutes to address pain. Session ended in pt room, where pt was left semi-reclined in bed with bilat PRAFO boots on, R knee immobilizer on, daughter present, and all needs within reach. Overall, awareness of RLE position is significantly improved, as compared with previous sessions.  Therapy Documentation Precautions:  Precautions Precautions: Fall;Posterior Hip;Other (comment) Precaution Booklet Issued: No Precaution Comments: Pt able to independently verbalize all posterior hip precautions. Required Braces or Orthoses: Knee Immobilizer - Right Knee Immobilizer - Right: On at all times;Other (comment) Restrictions Weight Bearing Restrictions: Yes RLE Weight Bearing: Non weight bearing Other Position/Activity Restrictions: per Marissa Nestle, PA Vital Signs: Therapy Vitals Temp: 98.3 F (36.8 C) Temp src: Oral Pulse Rate: 70 Resp: 18 BP: 101/70 mmHg Patient Position, if appropriate: Lying Oxygen Therapy SpO2: 98 % O2 Device: None (Room air) Pain: Pain Assessment Pain Assessment: 0-10 Pain Score: 4  Pain Location: Hip Pain Orientation: Right Pain Descriptors / Indicators: Aching Pain Onset: On-going Pain Intervention(s): Cold applied;Repositioned Multiple Pain Sites: No Locomotion : Ambulation Ambulation/Gait Assistance: 4: Min guard Wheelchair Mobility Distance: 50   See  FIM for current functional status  Therapy/Group: Individual Therapy  Ascencion Coye, Lorenda Ishihara 03/21/2013, 4:11 PM

## 2013-03-21 NOTE — Progress Notes (Signed)
Orthopedic Tech Progress Note Patient Details:  Kayla [name] [birth year] [id number] Outside vendor contacted for brace order Patient ID: [name], female   DOB: 12/03/1950, 62 y.o.   MRN: [id number]   Orie Rout 03/21/2013, 10:08 AM

## 2013-03-21 NOTE — Plan of Care (Signed)
Problem: RH PAIN MANAGEMENT Goal: RH STG PAIN MANAGED AT OR BELOW PT'S PAIN GOAL 3 or less on scale 0-10  Outcome: Progressing Pain controlled with scheduled Baclofen and prn Tylenol 650mg  q 4hrs

## 2013-03-21 NOTE — Plan of Care (Signed)
Problem: RH BLADDER ELIMINATION Goal: RH STG MANAGE BLADDER WITH EQUIPMENT WITH ASSISTANCE STG Manage Bladder With Equipment With min Assistance  Outcome: Not Progressing Requiring I&O cath

## 2013-03-21 NOTE — Plan of Care (Signed)
Problem: RH SKIN INTEGRITY Goal: RH STG MAINTAIN SKIN INTEGRITY WITH ASSISTANCE STG Maintain Skin Integrity With min Assistance.  Outcome: Progressing Hip incision with dermabond, cdi

## 2013-03-21 NOTE — Progress Notes (Signed)
Speech Language Pathology Daily Session Note  Patient Details  Name: Kayla [name] MRN: [id number] Date of Birth: 1950-04-24  Today's Date: 03/21/2013 Time: 1130-1200 Time Calculation (min): 30 min  Short Term Goals: Week 1: SLP Short Term Goal 1 (Week 1): Pt will utilize safe swallow strategies with Dys 3 textures and thin liquids with supervision verbal cues to reduce overt s/s of aspiration SLP Short Term Goal 2 (Week 1): Pt will consume trials of regular textures with Min cues for use of safe swallow strategies to assess readiness for advancement SLP Short Term Goal 3 (Week 1): Pt will demonstrate adequate diaphragmatic breathing for increased breath support at the phrase level with Min cues  Skilled Therapeutic Interventions: Treatment focused on swallowing goals. SLP facilitated session with skilled observation of advanced tray consisting of regular textures and thin liquids. Pt consumed meal with wet vocal quality x1, which she self-corrected with reflexive throat clear. Pt self-managed several of her safe swallowing strategies including small bites/sips, multiple swallows, and slow rate. She required one supervision level cue for use of chin tuck. Pt consumed trials of regular textures and thin liquids without a chin tuck with no overt s/s of aspiration observed. Given the above as well as her improving respiratory status (pt now on room air), recommend to repeat objective test to assess need for chin tuck strategy.   FIM:  Comprehension Comprehension Mode: Auditory Comprehension: 6-Follows complex conversation/direction: With extra time/assistive device Expression Expression Mode: Verbal Expression: 5-Expresses complex 90% of the time/cues < 10% of the time Social Interaction Social Interaction: 6-Interacts appropriately with others with medication or extra time (anti-anxiety, antidepressant). Problem Solving Problem Solving: 5-Solves basic problems: With no  assist Memory Memory: 6-More than reasonable amt of time FIM - Eating Eating Activity: 5: Supervision/cues  Pain Pain Assessment Pain Assessment: No/denies pain Pain Score: 4   Therapy/Group: Individual Therapy   Kayla [name], M.A. CCC-SLP [phone]   Kayla [name] 03/21/2013, 12:48 PM

## 2013-03-21 NOTE — Plan of Care (Signed)
Problem: RH SAFETY Goal: RH STG ADHERE TO SAFETY PRECAUTIONS W/ASSISTANCE/DEVICE STG Adhere to Safety Precautions With Assistance/Device. Supervision Outcome: Progressing Calls appropriately for assistance     

## 2013-03-21 NOTE — Progress Notes (Signed)
Occupational Therapy Session Note  Patient Details  Name: Kayla [name] MRN: [id number] Date of Birth: 04/30/1950  Today's Date: 03/21/2013 Time: 0730-0827 Time Calculation (min): 57 min  Short Term Goals: Week 1:  OT Short Term Goal 1 (Week 1): Pt will complete functional transfers with min assist and no verbal cues OT Short Term Goal 2 (Week 1): Pt will complete toilet task with min assist OT Short Term Goal 3 (Week 1): Patient will complete LB dressing using AE prn with moderate assistance OT Short Term Goal 4 (Week 1): Pt will utilize pursed breathing technique with min cues when fatigued during therapy session  Skilled Therapeutic Interventions/Progress Updates:    Pt seen for ADL retraining with focus on activity tolerance, sit<>stand, standing tolerance, and carryover of precautions. Pt received supine in bed finishing breakfast and willing to complete bathing from w/c level at sink. Pt verbalized posterior hip precautions and aware that she has difficulty carrying them over during functional activities. Pt required min cues for carryover during bathing and dressing. Pt requesting to wear hospital gown stating she will be wearing gowns at home and encouraged to have husband bring in some gowns from home to wear. Pt reported she will mostly likely not wear underwear as well and declined practicing with AE to don these as well. Therapist applied KI during donning of socks as pt reporting feeling more comfortable with it on. Required assist for positioning of sock-aid on RLE prior to donning. Pt complete sit<>stand with min assist and cues for postural control. Completed oral care in standing (approx 90 sec) with no rest break and min guard assist. At end of session pt left in w/c with elevated leg rest and KI applied to RLE. All needs in reach and pt with no questions or concerns at this time. Pt on room air throughout session with SpO2>93% throughout session.  Therapy  Documentation Precautions:  Precautions Precautions: Fall;Posterior Hip;Other (comment) Precaution Booklet Issued: No Precaution Comments: Pt able to independently verbalize all posterior hip precautions. Required Braces or Orthoses: Knee Immobilizer - Right Knee Immobilizer - Right: On at all times;Other (comment) Restrictions Weight Bearing Restrictions: Yes RLE Weight Bearing: Non weight bearing Other Position/Activity Restrictions: per Marissa Nestle, PA General:   Vital Signs: Therapy Vitals Temp: 98.5 F (36.9 C) Temp src: Oral Pulse Rate: 65 Resp: 18 BP: 102/68 mmHg Patient Position, if appropriate: Lying Oxygen Therapy SpO2: 97 % O2 Device: None (Room air) Pain: Pt reporting 1/10 pain at RLE during therapy session. RN provided pain meds during therapy session.   Other Treatments:    See FIM for current functional status  Therapy/Group: Individual Therapy  Daneil Dan 03/21/2013, 8:31 AM

## 2013-03-22 ENCOUNTER — Inpatient Hospital Stay (HOSPITAL_COMMUNITY): Payer: 59

## 2013-03-22 ENCOUNTER — Inpatient Hospital Stay (HOSPITAL_COMMUNITY): Payer: 59 | Admitting: Physical Therapy

## 2013-03-22 DIAGNOSIS — T84029A Dislocation of unspecified internal joint prosthesis, initial encounter: Secondary | ICD-10-CM

## 2013-03-22 DIAGNOSIS — W19XXXA Unspecified fall, initial encounter: Secondary | ICD-10-CM

## 2013-03-22 DIAGNOSIS — S72009A Fracture of unspecified part of neck of unspecified femur, initial encounter for closed fracture: Secondary | ICD-10-CM

## 2013-03-22 MED ORDER — SENNOSIDES-DOCUSATE SODIUM 8.6-50 MG PO TABS
2.0000 | ORAL_TABLET | Freq: Two times a day (BID) | ORAL | Status: DC
  Administered 2013-03-22 – 2013-03-31 (×19): 2 via ORAL
  Filled 2013-03-22 (×19): qty 2

## 2013-03-22 MED ORDER — BETHANECHOL CHLORIDE 25 MG PO TABS
25.0000 mg | ORAL_TABLET | Freq: Three times a day (TID) | ORAL | Status: DC
  Administered 2013-03-22 – 2013-03-30 (×26): 25 mg via ORAL
  Filled 2013-03-22 (×29): qty 1

## 2013-03-22 MED ORDER — ALBUTEROL SULFATE (2.5 MG/3ML) 0.083% IN NEBU: 2.5000 mg | INHALATION_SOLUTION | RESPIRATORY_TRACT | Status: DC | PRN

## 2013-03-22 NOTE — Progress Notes (Signed)
Subjective/Complaints: Hip with 1/10 pain but this is mainly ant thigh. Only having small infreq stools. Pain under control. Using tylenol primarily. A 12 point review of systems has been performed and if not noted above is otherwise negative.   Objective: Vital Signs: Blood pressure 117/70, pulse 73, temperature 98.5 F (36.9 C), temperature source Oral, resp. rate 18, height 5\' 3"  (1.6 m), weight 65.5 kg (144 lb 6.4 oz), SpO2 97.00%. No results found. No results found for this basename: WBC, HGB, HCT, PLT,  in the last 72 hours No results found for this basename: NA, K, CL, CO, GLUCOSE, BUN, CREATININE, CALCIUM,  in the last 72 hours CBG (last 3)  No results found for this basename: GLUCAP,  in the last 72 hours  Wt Readings from Last 3 Encounters:  03/15/13 65.5 kg (144 lb 6.4 oz)  03/15/13 65.5 kg (144 lb 6.4 oz)  03/13/13 63.2 kg (139 lb 5.3 oz)    Physical Exam:  Constitutional: She is oriented to person, place, and time. She appears well-developed and well-nourished.  HENT:  Head: Normocephalic and atraumatic.  Eyes: Pupils are equal, round, and reactive to light.  Neck: Neck supple.  Cardiovascular: Normal rate and regular rhythm.  Respiratory: Effort normal. She has normal breath sounds.  GI: Soft. Bowel sounds are normal. She exhibits no distension. There is no tenderness. There is no rebound and no guarding.  Musculoskeletal:  Leg length equal no int rotation deformity on Right side. Neurological: She is alert and oriented to person, place, and time. Coordination abnormal.  Upper ext 5/5 bilaterally. Left lower 3+ HF to 4- KE, to 4 ankle. RLE is 1+ HF, 2 KE, 3/5 ADF, 3+ APF. No gross sensory deficits were appreciated.  Skin: Skin is warm and dry.  Psychiatric: she is anxious   Assessment/Plan: 1. Functional deficits secondary to right FNF s/p hip hemi and subsequent dislocation. Pt also has MS. Her conditions require 3+ hours per day of interdisciplinary therapy in  a comprehensive inpatient rehab setting. Physiatrist is providing close team supervision and 24 hour management of active medical problems listed below. Physiatrist and rehab team continue to assess barriers to discharge/monitor patient progress toward functional and medical goals.  Given pt's recurrent dislocation (without obvious causative "incident"), will need to be very conservative going forward. Measured for hip abduction brace-- FIM: FIM - Bathing Bathing Steps Patient Completed: Chest;Right Arm;Left Arm;Left upper leg;Abdomen;Right upper leg Bathing: 4: Min-Patient completes 8-9 58f 10 parts or 75+ percent  FIM - Upper Body Dressing/Undressing Upper body dressing/undressing steps patient completed: Thread/unthread left sleeve of pullover shirt/dress;Put head through opening of pull over shirt/dress;Pull shirt over trunk;Thread/unthread right sleeve of pullover shirt/dresss Upper body dressing/undressing: 0: Wears gown/pajamas-no public clothing FIM - Lower Body Dressing/Undressing Lower body dressing/undressing steps patient completed: Don/Doff left sock Lower body dressing/undressing: 3: Mod-Patient completed 50-74% of tasks  FIM - Hotel manager Devices: Grab bar or rail for support Toileting: 0: Activity did not occur  FIM - Diplomatic Services operational officer Devices: Art gallery manager Transfers: 0-Activity did not occur  FIM - Banker Devices: Bed rails;Walker;HOB elevated;Arm rests Bed/Chair Transfer: 3: Bed > Chair or W/C: Mod A (lift or lower assist);3: Sit > Supine: Mod A (lifting assist/Pt. 50-74%/lift 2 legs);4: Supine > Sit: Min A (steadying Pt. > 75%/lift 1 leg)  FIM - Locomotion: Wheelchair Distance: 50 Locomotion: Wheelchair: 2: Travels 50 - 149 ft with supervision, cueing or coaxing FIM - Locomotion: Ambulation Locomotion: Ambulation  Assistive Devices: Designer, industrial/product Ambulation/Gait Assistance: 4:  Min guard Locomotion: Ambulation: 1: Travels less than 50 ft with minimal assistance (Pt.>75%)  Comprehension Comprehension Mode: Auditory Comprehension: 6-Follows complex conversation/direction: With extra time/assistive device  Expression Expression Mode: Verbal Expression: 5-Expresses complex 90% of the time/cues < 10% of the time  Social Interaction Social Interaction: 6-Interacts appropriately with others with medication or extra time (anti-anxiety, antidepressant).  Problem Solving Problem Solving: 5-Solves basic problems: With no assist  Memory Memory: 6-More than reasonable amt of time  Medical Problem List and Plan:  1. DVT Prophylaxis/Anticoagulation: Pharmaceutical: Lovenox  2. Pain Management: Prn tylenol and/or baclofen.  -tylenol -will use the hydrocodone at night to help with sleep and pain -low dose hydrocodone prn if needed. 3. Mood: Provide ego support for high levels of anxiety. Educate on hip precautions.   -anxiety not helping with hip pain 4. Neuropsych: This patient is capable of making decisions on her own behalf.  5. MS with neurogenic bladder: Recheck UA/UCS negative   -IC cath prn. Added urecholine Very small infreq voids -needs timed voiding schedule -augment bowel meds/program--- 6. Hypoxia: Aspiration PNA has been treated.  -Encourage IS with flutter valve.  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  -wean O2 as able 8. Leukocytosis: resolved 9. ABLA: Has improved post transfusion.  LOS (Days) 7 A FACE TO FACE EVALUATION WAS PERFORMED  Erick Colace 03/22/2013 6:36 AM

## 2013-03-22 NOTE — Progress Notes (Signed)
Physical Therapy Session Note  Patient Details  Name: Kayla [name] MRN: [id number] Date of Birth: Jun 22, 1950  Today's Date: 03/22/2013 Time: 1000-1104 and 1453-1540 Time Calculation (min): 64 min and 47 min  Short Term Goals: Week 1:  PT Short Term Goal 1 (Week 1): Pt to consistently perform bed mobility with supervision using rails with HOB flat. PT Short Term Goal 2 (Week 1): Pt to perform bed<>chair transfer with min A and rolling walker. PT Short Term Goal 3 (Week 1): Pt to perform gait x50' with supervision using LRAD. PT Short Term Goal 4 (Week 1): Pt to negotiate 4 steps with R rail requiring mod A.  Skilled Therapeutic Interventions/Progress Updates:    Treatment Session 1: Pt received semi-reclined in bed. Session focused on gait training, introducing rollator to enable pt to transfer from elevated surface and to facilitate energy conservation. Supine>sit with HOBN flat, bed rails, min A for RLE management. Explained/demonstrated use of LLE for RLE management (focus on adhering to hip precautions) to promote pt independence with bed mobility. Doffed R knee immobilizer. Donned ACE bandages x2 at RLE to facilitate R hip external rotation. Sit<>stand from elevated surfaces x3 with min A for bilat hip extension, manual approximation at R knee to promote neutral hip rotation. Gait x27', x30' in controlled environment with rolling walker, min A, multimodal cueing to promote postural normalization, as pt exhibits limited hip extension throughout gait cycle. W/c mobility x130' in controlled environment using bilat UE's with supervision.  Explained, demonstrated use of rollator brakes. Pt performed gait x48' in controlled environment with rollator and min guard, verbal cueing for safe use of hand brakes to decrease gait velocity; pt with effective return demonstration. During final gait trial, pt exhibits improved bilat hip extension throughout. Pt maintained R hip external rotation throughout  all gait trials without cueing or manual facilitation. Therapist transported pt to room in rollator seat. Sit>stand from rollator with close supervision. Stand-pivot to bed using rollator with supervision. Sit>supine with bed rail, HOB elevated, effective within-session carryover of utilization of LLE to elevate RLE while adhering to hip precautions. Scooting to [facility] with min A, bilat rails.  Effective between-session carryover of turning to L side to avoid R hip internal rotation. SpO2 maintained >96% throughout session without use of supplemental O2. Therapist departed with pt semi-reclined in bed with bilat Prevalon boots and R knee immobilizer on, pt friend present in room, 3 bed rails up, bed alarm on, and all needs within reach.   Treatment Session 2: Pt received semi-reclined in bed; agreeable to therapy. Session focused on standing tolerance and fitting of/education pertaining to R hip abduction orthosis. Orthotist, Thayer Ohm, present for majority of session. Chris and this PT donned R hip abduction orthosis. Pt performed supine>sit with min A for RLE management; HOB elevated, bed rail. Sit>stand from elevated bed height to rollator, supervision. Once in standing, pt performed static standing with bilat UE support at rollator x16 min total with supervision to min A for stability/balance with orthotist concurrently adjusting orthosis. Therapist concurrently providing manual facilitation at R ribs, L axilla for postural normalization. Gait x10', x12' with rollator, assist/cueing as abovementioned; orthotist assessing/addressing fit/alignment of orthosis. Stand-pivot from rollator seat>bed with supervision. Therapist educated pt on safe/proper use of leg lifter for bed mobility (focus on adherence to hip precautions). Sit>supine with HOB elevated, bed rail, use of leg lifter. Pt doffed orthosis with mod A. Therapist departed with pt semi-reclined in bed with bilat Prevalon boots and R knee immobilizer on,  pt  friend present in room, 3 bed rails up, bed alarm on, and all needs within reach   Therapy Documentation Precautions:  Precautions Precautions: Fall;Posterior Hip;Other (comment) Precaution Booklet Issued: No Precaution Comments: Pt able to independently verbalize all posterior hip precautions. Required Braces or Orthoses: Knee Immobilizer - Right Knee Immobilizer - Right: On at all times;Other (comment) Restrictions Weight Bearing Restrictions: Yes RLE Weight Bearing: Non weight bearing Other Position/Activity Restrictions: per Marissa Nestle, PA Pain: Pain Assessment Pain Assessment: 0-10 Pain Score: 1  Pain Type: Surgical pain Pain Location: Hip Pain Orientation: Right Pain Onset: On-going Pain Intervention(s): Elevated extremity;Repositioned Multiple Pain Sites: No Locomotion : Ambulation Ambulation/Gait Assistance: 4: Min assist Wheelchair Mobility Distance: 130   See FIM for current functional status  Therapy/Group: Individual Therapy  Aksel Bencomo, Lorenda Ishihara 03/22/2013, 12:17 PM

## 2013-03-22 NOTE — Progress Notes (Signed)
Occupational Therapy Session Note  Patient Details  Name: Kayla [name] MRN: [id number] Date of Birth: Sep 25, 1950  Today's Date: 03/22/2013 Time: 0730-0830 Time Calculation (min): 60 min  Short Term Goals: Week 1:  OT Short Term Goal 1 (Week 1): Pt will complete functional transfers with min assist and no verbal cues OT Short Term Goal 2 (Week 1): Pt will complete toilet task with min assist OT Short Term Goal 3 (Week 1): Patient will complete LB dressing using AE prn with moderate assistance OT Short Term Goal 4 (Week 1): Pt will utilize pursed breathing technique with min cues when fatigued during therapy session  Skilled Therapeutic Interventions/Progress Updates:    Pt seen for ADL retraining with focus on on carryover of hip precautions, activity tolerance, sit<>stand, and use of AE. Pt received supine in bed and therapist doffed KI. Pt ambulated with min assist bed>walk-in shower using RW. Min instructional cues provided prior to standing for technique. Completed bathing using LH and very conscious of precautions. Pt required steadying assist and cues for posture during peri hygiene. Completed dressing with increased time using reacher. Pt very conscious of precautions throughout dressing as well. Therapist assisted with socks secondary to time. After dressing pt ambulated back to w/c with min assist using RW. Therapist donned KI and left pt sitting in w/c with RN present. Pt very motivated by everything she accomplished this AM.  Therapy Documentation Precautions:  Precautions Precautions: Fall;Posterior Hip;Other (comment) Precaution Booklet Issued: No Precaution Comments: Pt able to independently verbalize all posterior hip precautions. Required Braces or Orthoses: Knee Immobilizer - Right Knee Immobilizer - Right: On at all times;Other (comment) Restrictions Weight Bearing Restrictions: Yes RLE Weight Bearing: Non weight bearing Other Position/Activity Restrictions: per Marissa Nestle, PA General:   Vital Signs: Therapy Vitals Temp: 98.5 F (36.9 C) Temp src: Oral Pulse Rate: 73 Resp: 18 BP: 117/70 mmHg Oxygen Therapy SpO2: 97 % O2 Device: None (Room air) Pain: Pt reporting 1/10 pain at hip. Pain not interfering with therapy.   See FIM for current functional status  Therapy/Group: Individual Therapy  Daneil Dan 03/22/2013, 8:43 AM

## 2013-03-22 NOTE — Procedures (Signed)
Objective Swallowing Evaluation: Modified Barium Swallowing Study  Patient Details  Name: KELECHI ASTARITA MRN: [id number] Date of Birth: 1950-07-19  Today's Date: 03/22/2013 Time: 0900-0930 Time Calculation (min): 30 min  Past Medical History:  Past Medical History  Diagnosis Date  . Multiple sclerosis   . Unspecified hypothyroidism   . Other voice and resonance disorders   . Other diseases of vocal cords   . Cervical cancer 2005   Past Surgical History:  Past Surgical History  Procedure Laterality Date  . Vesicovaginal fistula closure w/ tah    . Hip arthroplasty Right 03/01/2013    Procedure: ARTHROPLASTY BIPOLAR HIP;  Surgeon: Shelda Pal, MD;  Location: [facility];  Service: Orthopedics;  Laterality: Right;  . Hip closed reduction Right 03/12/2013    Procedure: CLOSED REDUCTION HIP;  Surgeon: [name], MD;  Location: [facility] OR;  Service: Orthopedics;  Laterality: Right;  . Hip closed reduction Right 03/19/2013    Procedure: CLOSED REDUCTION HIP;  Surgeon: Javier Docker, MD;  Location: [facility] OR;  Service: Orthopedics;  Laterality: Right;   HPI:  Kayla [name] is a 62 y.o. female with PMH of relapsing MS followed by Dr.Douglas, with foot drop, Hypothyrodiism, osteoporosis.  Pt was walking 02/23/13,  tripped and fell due to a rug, and subsequently started having R hip pain and was unable to ambulate.  EMS was called and brought pt to [facility] ER.  She was found to have a  Displaced right subcapital femoral neck fracture. MBS was completed 03/02/13, with recommendation for dys 2 diet with thin liquids.  SLP to see pt to determine tolerance of po diet, utlization of compensation strategies and for pt/family education.      Recommendation/Prognosis  Clinical Impression Dysphagia Diagnosis: Mild pharyngeal phase dysphagia;Mild cervical esophageal phase dysphagia  Clinical impression: Pt presents with a mild pharyngeal and cervical esophageal dysphagia, characterized by reduced  hyolaryngeal movement and subsequent reduced UES relaxation, resulting in trace-mild residuals at the pyriform sinuses and CP segment with all consistencies tested. Pt consistently performed a reflexive second swallow to reduce residuals. Trace, shallow penetration resulted from large straw sip which pt cleared with cued throat clear, however given her h/o vocal fold paresis and her decreased mobility from recent hip injury, would recommend to proceed without straws. Recommend Regular textures and thin liquids with no straws; no chin tuck necessary given improvements in pharyngeal swallow function.  Swallow Evaluation Recommendations: Diet Recommendations: Regular;Thin liquid Liquid Administration via: Cup;No straw Medication Administration: Whole meds with puree Supervision: Patient able to self feed;Intermittent supervision to cue for compensatory strategies Compensations: Slow rate;Small sips/bites;Multiple dry swallows after each bite/sip Postural Changes and/or Swallow Maneuvers: Seated upright 90 degrees;Upright 30-60 min after meal Oral Care Recommendations: Oral care BID Follow up Recommendations: None Prognosis Prognosis for Safe Diet Advancement: Good Individuals Consulted Consulted and Agree with Results and Recommendations: Patient  SLP Assessment/Plan    Short Term Goals: Week 1: SLP Short Term Goal 1 (Week 1): Pt will utilize safe swallow strategies with Dys 3 textures and thin liquids with supervision verbal cues to reduce overt s/s of aspiration SLP Short Term Goal 1 - Progress (Week 1): Met SLP Short Term Goal 2 (Week 1): Pt will consume trials of regular textures with Min cues for use of safe swallow strategies to assess readiness for advancement SLP Short Term Goal 2 - Progress (Week 1): Met SLP Short Term Goal 3 (Week 1): Pt will demonstrate adequate diaphragmatic breathing for increased breath support  at the phrase level with Min cues SLP Short Term Goal 4 (Week 1): Pt  will utilize safe swallow strategies with regular textures and thin liquids with supervision verbal cues to reduce overt s/s of aspiration  General:  Date of Onset:  (chronic) HPI: Kayla [name] is a 61 y.o. female with PMH of relapsing MS followed by Dr.Douglas, with foot drop, Hypothyrodiism, osteoporosis.  Pt was walking 02/23/13,  tripped and fell due to a rug, and subsequently started having R hip pain and was unable to ambulate.  EMS was called and brought pt to [facility] ER.  She was found to have a  Displaced right subcapital femoral neck fracture. MBS was completed 03/02/13, with recommendation for dys 2 diet with thin liquids.  SLP to see pt to determine tolerance of po diet, utlization of compensation strategies and for pt/family education.  Type of Study: Modified Barium Swallowing Study Reason for Referral: Objectively evaluate swallowing function Previous Swallow Assessment: MBS on 12/10 and recommended Dys. 2 textures with thin liquids but has since been upgraded to regular textures with use of compensatory strategies.  Diet Prior to this Study: Regular;Thin liquids Temperature Spikes Noted: No Respiratory Status: Room air History of Recent Intubation: No Behavior/Cognition: Alert;Cooperative;Pleasant mood Oral Cavity - Dentition: Adequate natural dentition Oral Motor / Sensory Function: Within functional limits Self-Feeding Abilities: Able to feed self Patient Positioning: Upright in chair Baseline Vocal Quality: Other (comment) (at baseline per pt (mild hoarseness and inspiratory stridor noted)) Volitional Cough: Strong Volitional Swallow: Able to elicit Anatomy: Within functional limits Pharyngeal Secretions: Not observed secondary MBS  Reason for Referral:  Objectively evaluate swallowing function   Oral Phase Oral Preparation/Oral Phase Oral Phase: WFL Pharyngeal Phase  Pharyngeal Phase Pharyngeal Phase: Impaired Pharyngeal - Thin Pharyngeal - Thin Cup: Reduced  anterior laryngeal mobility;Reduced laryngeal elevation;Pharyngeal residue - pyriform sinuses;Pharyngeal residue - cp segment Pharyngeal - Thin Straw: Reduced anterior laryngeal mobility;Reduced laryngeal elevation;Pharyngeal residue - pyriform sinuses;Pharyngeal residue - cp segment;Penetration/Aspiration during swallow Penetration/Aspiration details (thin straw): Material enters airway, remains ABOVE vocal cords and not ejected out Pharyngeal - Solids Pharyngeal - Mechanical Soft: Reduced anterior laryngeal mobility;Reduced laryngeal elevation;Pharyngeal residue - cp segment;Pharyngeal residue - pyriform sinuses Pharyngeal - Regular: Not tested Pharyngeal Phase - Comment Pharyngeal Comment: reflexive second swallows were effective at reducing trace-mild residuals Cervical Esophageal Phase  Cervical Esophageal Phase Cervical Esophageal Phase: Impaired Cervical Esophageal Phase - Thin Thin Cup: Reduced cricopharyngeal relaxation Thin Straw: Reduced cricopharyngeal relaxation Cervical Esophageal Phase - Solids Mechanical Soft: Reduced cricopharyngeal relaxation Regular: Not tested Pill: Not tested   G-Codes     Maxcine Ham, M.A. CCC-SLP [phone]  Maxcine Ham 03/22/2013, 9:51 AM

## 2013-03-22 NOTE — Progress Notes (Signed)
Orthopedic Tech Progress Note Patient Details:  Kayla [name] 1950/08/12 [id number] Brace order completed by [facility] Patient ID: [name], female   DOB: 08/19/1950, 62 y.o.   MRN: [id number]   Jennye Moccasin 03/22/2013, 9:46 PM

## 2013-03-23 ENCOUNTER — Inpatient Hospital Stay (HOSPITAL_COMMUNITY): Payer: 59

## 2013-03-23 ENCOUNTER — Inpatient Hospital Stay (HOSPITAL_COMMUNITY): Payer: 59 | Admitting: *Deleted

## 2013-03-23 ENCOUNTER — Inpatient Hospital Stay (HOSPITAL_COMMUNITY): Payer: 59 | Admitting: Physical Therapy

## 2013-03-23 DIAGNOSIS — G35 Multiple sclerosis: Secondary | ICD-10-CM

## 2013-03-23 NOTE — Progress Notes (Signed)
Patient is orthostatic B/P in the AM. Per Nurse Tech, B/P was taken lying, but was unsuccessful x3 attempts while standing. "Patient moved a lot preventing correct reading." Patient insisted to be put back in bed.

## 2013-03-23 NOTE — Progress Notes (Signed)
Occupational Therapy Weekly Progress Note  Patient Details  Name: Kayla [name] MRN: [id number] Date of Birth: 1950/08/06  Today's Date: 03/23/2013  Patient has met 4 of 4 short term goals.  Patient initially limited by anxiety and fear with dislocating hip another time, however this fear and anxiety has resolved greatly. Patient is aware of her precautions throughout self-care tasks and consistently asks therapist if her "leg is ok." Patient now has a Knee Immobilizer while in bed and a hip abduction orthosis while out of bed which appears to decrease anxiety greatly. Patient has demonstrates the ability to direct care when instructing therapist to don orthosis. Patient has progressed to min assist with stand pivot transfers and min-supervision with sit<>stand. Patient demonstrates good carryover of pursed lip breathing and energy conservation techniques as she no longer uses O2 during therapy sessions and SpO2>93% all times. Patient is also improving with use of AE during LB self-care tasks.   Patient continues to demonstrate the following deficits: decreased strength, decreased activity tolerance, decreased standing tolerance, decreased postural control in standing, decreased standing balance and therefore will continue to benefit from skilled OT intervention to enhance overall performance with BADL and strengtheing, activity tolerance, dynamic balance, and postural control .  Patient progressing toward long term goals..  Continue plan of care.      Therapy Documentation Precautions:  Precautions Precautions: Fall;Posterior Hip;Other (comment) Precaution Booklet Issued: No Precaution Comments: Pt able to independently verbalize all posterior hip precautions. Required Braces or Orthoses: Knee Immobilizer - Right Knee Immobilizer - Right: On at all times;Other (comment) Restrictions Weight Bearing Restrictions: Yes RLE Weight Bearing: Non weight bearing Other Position/Activity  Restrictions: per Marissa Nestle, PA General:   Vital Signs:    See FIM for current functional status  Therapy/Group: Individual Therapy  Daneil Dan 03/23/2013, 2:36 PM

## 2013-03-23 NOTE — Progress Notes (Signed)
Speech Language Pathology Daily Session Note  Patient Details  Name: Kayla [name] MRN: [id number] Date of Birth: 1950/09/22  Today's Date: 03/23/2013 Time: 1130-1215 Time Calculation (min): 45 min  Short Term Goals: Week 2: SLP Short Term Goal 1 (Week 2): Pt will utilize safe swallow strategies with regular textures and thin liquids with supervision cues to reduce overt s/s of aspiration SLP Short Term Goal 2 (Week 2): Pt will demonstrate adequate diaphragmatic breathing for increased breath support at the phrase level with Min cues  Skilled Therapeutic Interventions: Treatment focused on swallowing goals. SLP facilitated session with review of MBS results from 12/30 before skilled observation of lunch meal consisting of regular textures and thin liquids via cup sips. Pt required supervision level reminder of second swallow at beginning of meal, and then managed swallowing strategies with Mod I throughout the remainder of PO intake. Pt exhibited wet vocal quality x3, which was self-managed with a reflexive throat clear to return to baseline. SLP suspects throat clear strong enough to be effective at clearing possible penetrates.    FIM:  Comprehension Comprehension Mode: Auditory Comprehension: 6-Follows complex conversation/direction: With extra time/assistive device Expression Expression Mode: Verbal Expression: 6-Expresses complex ideas: With extra time/assistive device Social Interaction Social Interaction: 6-Interacts appropriately with others with medication or extra time (anti-anxiety, antidepressant). Problem Solving Problem Solving: 5-Solves basic problems: With no assist Memory Memory: 6-More than reasonable amt of time FIM - Eating Eating Activity: 5: Supervision/cues  Pain Pain Assessment Pain Assessment: No/denies pain Pain Score: 4   Therapy/Group: Individual Therapy   Maxcine Ham, M.A. CCC-SLP [phone]   Maxcine Ham 03/23/2013, 12:32  PM

## 2013-03-23 NOTE — Progress Notes (Signed)
Occupational Therapy Session Note  Patient Details  Name: Kayla [name] MRN: [id number] Date of Birth: 06/14/1950  Today's Date: 03/23/2013 Time: 0830-0930 Time Calculation (min): 60 min  Short Term Goals: Week 1:  OT Short Term Goal 1 (Week 1): Pt will complete functional transfers with min assist and no verbal cues OT Short Term Goal 2 (Week 1): Pt will complete toilet task with min assist OT Short Term Goal 3 (Week 1): Patient will complete LB dressing using AE prn with moderate assistance OT Short Term Goal 4 (Week 1): Pt will utilize pursed breathing technique with min cues when fatigued during therapy session  Skilled Therapeutic Interventions/Progress Updates:    Pt seen for ADL retraining with focus on activity tolerance, adherence to precautions, sit<>stand, and standing tolerance. Pt received supine in bed and requesting to complete bathing at sink. Required min assist to manage RLE off bed for supine>sit. Pt using rollator and completed sit<>stand with min guard assist. Pt required 1 cue for adherence to precautions, however continued to be aware remainder of session as she was consistently asking "is my leg ok." Discussed precautions with pt and provided examples positioning of RLE that would break them. Pt required min assist for all sit<>stand and steadying-supervision for standing balance. Pt taking rest breaks throughout session and SpO2>93% throughout. Pt utilized reacher with increased time for threading BLE into pants. Practiced using LH shoe horn for donning B shoes however unsuccessful. Will continue to trial various techniques with donning shoes. Pt reports she will have assist with this if needed. Donned hip abduction orthosis with pt demonstrating ability to direct therapist on donning brace correctly in prep for having husband don it at home. At end of session pt left sitting in chair with RUE elevated and all needs in reach.   Therapy Documentation Precautions:   Precautions Precautions: Fall;Posterior Hip;Other (comment) Precaution Booklet Issued: No Precaution Comments: Pt able to independently verbalize all posterior hip precautions. Required Braces or Orthoses: Knee Immobilizer - Right Knee Immobilizer - Right: On at all times;Other (comment) Restrictions Weight Bearing Restrictions: Yes RLE Weight Bearing: Non weight bearing Other Position/Activity Restrictions: per Marissa Nestle, PA General:   Vital Signs: Oxygen Therapy SpO2: 98 % O2 Device: None (Room air) Pain: Pt with no report of pain during therapy session.   See FIM for current functional status  Therapy/Group: Individual Therapy  Daneil Dan 03/23/2013, 10:50 AM

## 2013-03-23 NOTE — Progress Notes (Signed)
Recreational Therapy Session Note  Patient Details  Name: Kayla [name] MRN: [id number] Date of Birth: [birth year] Today's Date: 03/23/2013  Order received, chart reviewed.  Discussion/education with pt & pt's daughter about leisure interests, community reintegration, & energy conservation.  Pt stated understanding.  No further TR treatment implemented. Tomasina Keasling 03/23/2013, 3:11 PM

## 2013-03-23 NOTE — Progress Notes (Addendum)
Subjective/Complaints: Hip with 1/10 pain but this is mainly ant thigh. Bowels starting to move but still req I/O caths. Pain under control. Using tylenol primarily. A 12 point review of systems has been performed and if not noted above is otherwise negative.   Objective: Vital Signs: Blood pressure 112/62, pulse 67, temperature 97.9 F (36.6 C), temperature source Oral, resp. rate 18, height 5\' 3"  (1.6 m), weight 65.5 kg (144 lb 6.4 oz), SpO2 98.00%. Dg Swallowing Func-speech Pathology  03/22/2013   Maxcine Ham, CCC-SLP     03/22/2013  9:52 AM Objective Swallowing Evaluation: Modified Barium Swallowing Study   Patient Details  Name: Kayla [name] MRN: [id number] Date of Birth: [birth year]  Today's Date: 03/22/2013 Time: 0900-0930 Time Calculation (min): 30 min  Past Medical History:  Past Medical History  Diagnosis Date  . Multiple sclerosis   . Unspecified hypothyroidism   . Other voice and resonance disorders   . Other diseases of vocal cords   . Cervical cancer 2005   Past Surgical History:  Past Surgical History  Procedure Laterality Date  . Vesicovaginal fistula closure w/ tah    . Hip arthroplasty Right 03/01/2013    Procedure: ARTHROPLASTY BIPOLAR HIP;  Surgeon: Shelda Pal,  MD;  Location: [facility];  Service: Orthopedics;  Laterality: Right;   . Hip closed reduction Right 03/12/2013    Procedure: CLOSED REDUCTION HIP;  Surgeon: [name], MD;   Location: [facility] OR;  Service: Orthopedics;  Laterality: Right;  . Hip closed reduction Right 03/19/2013    Procedure: CLOSED REDUCTION HIP;  Surgeon: Javier Docker, MD;   Location: [facility] OR;  Service: Orthopedics;  Laterality: Right;   HPI:  Kayla [name] is a 62 y.o. female with PMH of relapsing MS  followed by Dr.Douglas, with foot drop, Hypothyrodiism,  osteoporosis.  Pt was walking 02/23/13,  tripped and fell due to a  rug, and subsequently started having R hip pain and was unable to  ambulate.  EMS was called and brought pt to [facility] ER.  She  was  found to have a  Displaced right subcapital femoral neck  fracture. MBS was completed 03/02/13, with recommendation for dys  2 diet with thin liquids.  SLP to see pt to determine tolerance  of po diet, utlization of compensation strategies and for  pt/family education.      Recommendation/Prognosis  Clinical Impression Dysphagia Diagnosis: Mild pharyngeal phase dysphagia;Mild  cervical esophageal phase dysphagia  Clinical impression: Pt presents with a mild pharyngeal and  cervical esophageal dysphagia, characterized by reduced  hyolaryngeal movement and subsequent reduced UES relaxation,  resulting in trace-mild residuals at the pyriform sinuses and CP  segment with all consistencies tested. Pt consistently performed  a reflexive second swallow to reduce residuals. Trace, shallow  penetration resulted from large straw sip which pt cleared with  cued throat clear, however given her h/o vocal fold paresis and  her decreased mobility from recent hip injury, would recommend to  proceed without straws. Recommend Regular textures and thin  liquids with no straws; no chin tuck necessary given improvements  in pharyngeal swallow function.  Swallow Evaluation Recommendations: Diet Recommendations: Regular;Thin liquid Liquid Administration via: Cup;No straw Medication Administration: Whole meds with puree Supervision: Patient able to self feed;Intermittent supervision  to cue for compensatory strategies Compensations: Slow rate;Small sips/bites;Multiple dry swallows  after each bite/sip Postural Changes and/or Swallow Maneuvers: Seated upright 90  degrees;Upright 30-60 min after meal Oral Care Recommendations: Oral care BID Follow  up Recommendations: None Prognosis Prognosis for Safe Diet Advancement: Good Individuals Consulted Consulted and Agree with Results and Recommendations: Patient  SLP Assessment/Plan    Short Term Goals: Week 1: SLP Short Term Goal 1 (Week 1): Pt will utilize safe  swallow strategies with Dys 3  textures and thin liquids with  supervision verbal cues to reduce overt s/s of aspiration SLP Short Term Goal 1 - Progress (Week 1): Met SLP Short Term Goal 2 (Week 1): Pt will consume trials of regular  textures with Min cues for use of safe swallow strategies to  assess readiness for advancement SLP Short Term Goal 2 - Progress (Week 1): Met SLP Short Term Goal 3 (Week 1): Pt will demonstrate adequate  diaphragmatic breathing for increased breath support at the  phrase level with Min cues SLP Short Term Goal 4 (Week 1): Pt will utilize safe swallow  strategies with regular textures and thin liquids with  supervision verbal cues to reduce overt s/s of aspiration  General:  Date of Onset:  (chronic) HPI: Kayla [name] is a 62 y.o. female with PMH of relapsing MS  followed by Dr.Douglas, with foot drop, Hypothyrodiism,  osteoporosis.  Pt was walking 02/23/13,  tripped and fell due to a  rug, and subsequently started having R hip pain and was unable to  ambulate.  EMS was called and brought pt to [facility] ER.  She was  found to have a  Displaced right subcapital femoral neck  fracture. MBS was completed 03/02/13, with recommendation for dys  2 diet with thin liquids.  SLP to see pt to determine tolerance  of po diet, utlization of compensation strategies and for  pt/family education.  Type of Study: Modified Barium Swallowing Study Reason for Referral: Objectively evaluate swallowing function Previous Swallow Assessment: MBS on 12/10 and recommended Dys. 2  textures with thin liquids but has since been upgraded to regular  textures with use of compensatory strategies.  Diet Prior to this Study: Regular;Thin liquids Temperature Spikes Noted: No Respiratory Status: Room air History of Recent Intubation: No Behavior/Cognition: Alert;Cooperative;Pleasant mood Oral Cavity - Dentition: Adequate natural dentition Oral Motor / Sensory Function: Within functional limits Self-Feeding Abilities: Able to feed self Patient  Positioning: Upright in chair Baseline Vocal Quality: Other (comment) (at baseline per pt (mild  hoarseness and inspiratory stridor noted)) Volitional Cough: Strong Volitional Swallow: Able to elicit Anatomy: Within functional limits Pharyngeal Secretions: Not observed secondary MBS  Reason for Referral:  Objectively evaluate swallowing function   Oral Phase Oral Preparation/Oral Phase Oral Phase: WFL Pharyngeal Phase  Pharyngeal Phase Pharyngeal Phase: Impaired Pharyngeal - Thin Pharyngeal - Thin Cup: Reduced anterior laryngeal  mobility;Reduced laryngeal elevation;Pharyngeal residue -  pyriform sinuses;Pharyngeal residue - cp segment Pharyngeal - Thin Straw: Reduced anterior laryngeal  mobility;Reduced laryngeal elevation;Pharyngeal residue -  pyriform sinuses;Pharyngeal residue - cp  segment;Penetration/Aspiration during swallow Penetration/Aspiration details (thin straw): Material enters  airway, remains ABOVE vocal cords and not ejected out Pharyngeal - Solids Pharyngeal - Mechanical Soft: Reduced anterior laryngeal  mobility;Reduced laryngeal elevation;Pharyngeal residue - cp  segment;Pharyngeal residue - pyriform sinuses Pharyngeal - Regular: Not tested Pharyngeal Phase - Comment Pharyngeal Comment: reflexive second swallows were effective at  reducing trace-mild residuals Cervical Esophageal Phase  Cervical Esophageal Phase Cervical Esophageal Phase: Impaired Cervical Esophageal Phase - Thin Thin Cup: Reduced cricopharyngeal relaxation Thin Straw: Reduced cricopharyngeal relaxation Cervical Esophageal Phase - Solids Mechanical Soft: Reduced cricopharyngeal relaxation Regular: Not tested Pill: Not tested   G-Codes  Maxcine Ham, M.A. CCC-SLP [phone]  Maxcine Ham 03/22/2013, 9:51 AM     No results found for this basename: WBC, HGB, HCT, PLT,  in the last 72 hours No results found for this basename: NA, K, CL, CO, GLUCOSE, BUN, CREATININE, CALCIUM,  in the last 72 hours CBG (last 3)  No  results found for this basename: GLUCAP,  in the last 72 hours  Wt Readings from Last 3 Encounters:  03/15/13 65.5 kg (144 lb 6.4 oz)  03/15/13 65.5 kg (144 lb 6.4 oz)  03/13/13 63.2 kg (139 lb 5.3 oz)    Physical Exam:  Constitutional: She is oriented to person, place, and time. She appears well-developed and well-nourished.  HENT:  Head: Normocephalic and atraumatic.  Eyes: Pupils are equal, round, and reactive to light.  Neck: Neck supple.  Cardiovascular: Normal rate and regular rhythm.  Respiratory: Effort normal. She has normal breath sounds.  GI: Soft. Bowel sounds are normal. She exhibits no distension. There is no tenderness. There is no rebound and no guarding.  Musculoskeletal:  Leg length equal no int rotation deformity on Right side. Neurological: She is alert and oriented to person, place, and time. Coordination abnormal.  Upper ext 5/5 bilaterally. Left lower 3+ HF to 4- KE, to 4 ankle. RLE is 1+ HF, 2 KE, 3/5 ADF, 3+ APF. No sensory loss to LT  Skin: Skin is warm and dry.  Psychiatric: she is anxious   Assessment/Plan: 1. Functional deficits secondary to right FNF s/p hip hemi and subsequent dislocation. Pt also has MS. Her conditions require 3+ hours per day of interdisciplinary therapy in a comprehensive inpatient rehab setting. Physiatrist is providing close team supervision and 24 hour management of active medical problems listed below. Physiatrist and rehab team continue to assess barriers to discharge/monitor patient progress toward functional and medical goals. Received hip abduction brace--comfortable and able to amb with this Team conference today please see physician documentation under team conference tab, met with team face-to-face to discuss problems,progress, and goals. Formulized individual treatment plan based on medical history, underlying problem and comorbidities. FIM: FIM - Bathing Bathing Steps Patient Completed: Chest;Right Arm;Left Arm;Left upper  leg;Abdomen;Right upper leg;Front perineal area;Buttocks;Right lower leg (including foot);Left lower leg (including foot) Bathing: 4: Steadying assist  FIM - Upper Body Dressing/Undressing Upper body dressing/undressing steps patient completed: Thread/unthread left sleeve of pullover shirt/dress;Put head through opening of pull over shirt/dress;Pull shirt over trunk;Thread/unthread right sleeve of pullover shirt/dresss;Thread/unthread right bra strap;Thread/unthread left bra strap;Hook/unhook bra Upper body dressing/undressing: 5: Set-up assist to: Obtain clothing/put away FIM - Lower Body Dressing/Undressing Lower body dressing/undressing steps patient completed: Thread/unthread right underwear leg;Pull underwear up/down;Thread/unthread right pants leg;Thread/unthread left pants leg;Pull pants up/down Lower body dressing/undressing: 3: Mod-Patient completed 50-74% of tasks  FIM - Hotel manager Devices: Grab bar or rail for support Toileting: 0: Activity did not occur  FIM - Diplomatic Services operational officer Devices: Art gallery manager Transfers: 0-Activity did not occur  FIM - Banker Devices: Walker;HOB elevated;Arm rests;Bed rails;Orthosis (rollator; hip abduction orthosis) Bed/Chair Transfer: 4: Supine > Sit: Min A (steadying Pt. > 75%/lift 1 leg);5: Sit > Supine: Supervision (verbal cues/safety issues);4: Bed > Chair or W/C: Min A (steadying Pt. > 75%);5: Chair or W/C > Bed: Supervision (verbal cues/safety issues)  FIM - Locomotion: Wheelchair Distance: 130 Locomotion: Wheelchair: 0: Activity did not occur FIM - Locomotion: Ambulation Locomotion: Ambulation Assistive Devices: Walker - Rolling;Other (comment) (RW for initial 2 trials; rollator for final trial)  Ambulation/Gait Assistance: 4: Min guard Locomotion: Ambulation: 1: Travels less than 50 ft with minimal assistance (Pt.>75%)  Comprehension Comprehension Mode:  Auditory Comprehension: 6-Follows complex conversation/direction: With extra time/assistive device  Expression Expression Mode: Verbal Expression: 6-Expresses complex ideas: With extra time/assistive device  Social Interaction Social Interaction: 6-Interacts appropriately with others with medication or extra time (anti-anxiety, antidepressant).  Problem Solving Problem Solving: 5-Solves basic problems: With no assist  Memory Memory: 6-More than reasonable amt of time  Medical Problem List and Plan:  1. DVT Prophylaxis/Anticoagulation: Pharmaceutical: Lovenox  2. Pain Management: Prn tylenol and/or baclofen.  -tylenol -will use the hydrocodone at night to help with sleep and pain -low dose hydrocodone prn if needed. 3. Mood: Provide ego support for high levels of anxiety. Educate on hip precautions.   -anxiety not helping with hip pain 4. Neuropsych: This patient is capable of making decisions on her own behalf.  5. MS with neurogenic bladder: Recheck UA/UCS negative   -IC cath prn. Added urecholine Very small infreq voids -needs timed voiding schedule -augment bowel meds/program--- 6. Hypoxia: Aspiration PNA has been treated.  -Encourage IS with flutter valve.  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  -wean O2 as able 8. Leukocytosis: resolved 9. ABLA: Has improved post transfusion.  LOS (Days) 8 A FACE TO FACE EVALUATION WAS PERFORMED  Claudette Laws E 03/23/2013 8:01 AM

## 2013-03-23 NOTE — Progress Notes (Signed)
Social Work Patient ID: Kayla [name], female   DOB: 29-Oct-1950, 62 y.o.   MRN: [id number] Met with pt and daughter to inform of team conference progression toward goals and discharge needing to be extended to reach her goals. Pt is agreeable to this and glad to be staying a few extra days to work on her progress and feels she will get stronger.  Will work on discharge needs and work toward Discharge 1/10.

## 2013-03-23 NOTE — Patient Care Conference (Signed)
Inpatient RehabilitationTeam Conference and Plan of Care Update Date: 03/23/2013   Time: 11:00 AM    Patient Name: Kayla [name]      Medical Record Number: [id number]  Date of Birth: 1950/05/09 Sex: Female         Room/Bed: 4W05C/4W05C-01 Payor Info: Payor: Advertising copywriter / Plan: Advertising copywriter / Product Type: *No Product type* /    Admitting Diagnosis: HIP FX dislocated right total hip  Admit Date/Time:  03/15/2013  5:38 PM Admission Comments: No comment available   Primary Diagnosis:  S/P hip hemiarthroplasty Principal Problem: S/P hip hemiarthroplasty  Patient Active Problem List   Diagnosis Date Noted  . S/P hip hemiarthroplasty 03/15/2013  . Hypoxia 03/14/2013  . Dislocation of hip prosthesis 03/12/2013  . Hip dislocation, left 03/12/2013  . Hip dislocation, right 03/12/2013  . Acute blood loss anemia 03/11/2013  . Urinary retention with incomplete bladder emptying 03/09/2013  . Leucocytosis 03/09/2013  . Dysphagia, unspecified(787.20) 03/09/2013  . Femoral neck fracture 03/08/2013  . CAP (community acquired pneumonia) 02/26/2013  . Unspecified constipation 02/26/2013  . HCAP (healthcare-associated pneumonia) 02/26/2013  . Acute diastolic heart failure 02/26/2013  . Vocal cord paralysis 02/26/2013  . Acute respiratory failure with hypoxia 02/24/2013  . Pain 02/24/2013  . Fractured femoral neck 02/23/2013  . Hip fracture 02/23/2013  . Excessive flatus 10/21/2012  . Cold feet 07/29/2012  . Hyponatremia 05/20/2012  . Calf pain 11/17/2011  . Back pain 11/27/2010  . DYSPNEA 12/06/2007  . OSTEOPENIA 09/17/2007  . HYPOTHYROIDISM 04/01/2007  . Multiple sclerosis 04/01/2007  . Other diseases of vocal cords 04/01/2007    Expected Discharge Date: Expected Discharge Date: 04/02/13  Team Members Present: Physician leading conference: Dr. Claudette Laws Social Worker Present: Dossie Der, LCSW Nurse Present: Other (comment) Doree Fudge Rosero-RN) PT Present: Edman Circle, PT OT Present: Bretta Bang, Suszanne Conners, OT SLP Present: Fae Pippin, SLP PPS Coordinator present : Tora Duck, RN, CRRN     Current Status/Progress Goal Weekly Team Focus  Medical   dislocation reduced again, in abd brace, pain controlled  educate adn use brace to avoid recurrent dislocation  see above   Bowel/Bladder   Pt. unable to void.In and out cath Q 8 hrs.  pt. able to void on her own  [place]ore In and out put Q. shift   Swallow/Nutrition/ Hydration   regular textures and thin liquids, no longer requires chin tuck, intermittent supervision  Mod I  diet tolerance, increase use of swallowing strategies   ADL's   min assist transfers, steadying assist bathing, mod assist LB dressing and toilet task. SpO2>92% on room air throughout sessions; min-no cues for adherence to precautions   supervision overall with min assist tub transfer  activity tolerance, carryover of precautions, use of AE, strengthening, energy conservation, standing balance, functional transfers   Mobility   Supervision with w/c mobility, min A with bed mobility/transfers, min guard with gait  mod I with bed mobility, transfers, and w/c mobility; supervision with gait; min A with stair negotiation  endurance, gait/transfer training, stair negotiation, bed mobility, generalized LE strengthening    Communication   Supervision  Mod I   diaphragmatic breathing at the sentence level   Safety/Cognition/ Behavioral Observations    NO safety concerns        Pain   Complain of right leg sometimes  keep pain level 3 on scale 1 to 10  monitor and assess pain Q 2 hrs.   Skin   Surgical incesion completely healed  To keep skim with out break down  To monitor skin Q shift, and assess fpr skin break down      *See Care Plan and progress notes for long and short-term goals.  Barriers to Discharge: cognition, weakness related to MS    Possible Resolutions to Barriers:  cont ed of pt and caregiver, see above     Discharge Planning/Teaching Needs:  Home with family who can provide short term 24 hr care      Team Discussion:  Making good progress-regular diet no strategies.  Right leg pain-MD managing.  Weaned off O2.  Still concerned about popping hip out.  Revisions to Treatment Plan:  Needs a few more days to reach goals   Continued Need for Acute Rehabilitation Level of Care: The patient requires daily medical management by a physician with specialized training in physical medicine and rehabilitation for the following conditions: Daily direction of a multidisciplinary physical rehabilitation program to ensure safe treatment while eliciting the highest outcome that is of practical value to the patient.: Yes Daily medical management of patient stability for increased activity during participation in an intensive rehabilitation regime.: Yes Daily analysis of laboratory values and/or radiology reports with any subsequent need for medication adjustment of medical intervention for : Neurological problems;Post surgical problems  Colbi Schiltz, Lemar Livings 03/23/2013, 1:33 PM

## 2013-03-23 NOTE — Progress Notes (Signed)
Speech Language Pathology Weekly Progress Note  Patient Details  Name: Kayla [name] MRN: [id number] Date of Birth: 1950/03/28  Today's Date: 03/23/2013  Short Term Goals: Week 1: SLP Short Term Goal 1 (Week 1): Pt will utilize safe swallow strategies with Dys 3 textures and thin liquids with supervision verbal cues to reduce overt s/s of aspiration SLP Short Term Goal 1 - Progress (Week 1): Met SLP Short Term Goal 2 (Week 1): Pt will consume trials of regular textures with Min cues for use of safe swallow strategies to assess readiness for advancement SLP Short Term Goal 2 - Progress (Week 1): Met SLP Short Term Goal 3 (Week 1): Pt will demonstrate adequate diaphragmatic breathing for increased breath support at the phrase level with Min cues SLP Short Term Goal 3 - Progress (Week 1): Not met SLP Short Term Goal 4 (Week 1): Pt will utilize safe swallow strategies with regular textures and thin liquids with supervision verbal cues to reduce overt s/s of aspiration SLP Short Term Goal 4 - Progress (Week 1): Not met  New Short Term Goals: Week 2: SLP Short Term Goal 1 (Week 2): Pt will utilize safe swallow strategies with regular textures and thin liquids with supervision cues to reduce overt s/s of aspiration SLP Short Term Goal 2 (Week 2): Pt will demonstrate adequate diaphragmatic breathing for increased breath support at the phrase level with Min cues  Weekly Progress Updates: Pt has met 2 out of 4 STGs during this reporting period, demonstrating functional gains in pharyngeal swallow function. She was advanced to regular textures and thin liquids, no longer requiring a chin tuck. Focus will shift to diaphragmatic breathing and diet tolerance. Pt education is ongoing. Pt will benefit from continued SLP services to maximize swallowing safety and breath support for functional communication.   SLP Intensity: Minumum of 1-2 x/day, 30 to 90 minutes SLP Frequency: 5 out of 7 days SLP  Duration/Estimated Length of Stay: 14-18 days SLP Treatment/Interventions: Financial trader;Dysphagia/aspiration precaution training;Environmental controls;Functional tasks;Internal/external aids;Patient/family education;Therapeutic Activities;Therapeutic Exercise;Speech/Language facilitation    Maxcine Ham, M.A. CCC-SLP [phone]   Maxcine Ham 03/23/2013, 11:14 AM

## 2013-03-23 NOTE — Progress Notes (Addendum)
Physical Therapy Weekly Progress Note  Patient Details  Name: Kayla [name] MRN: [id number] Date of Birth: 1950/09/16  Today's Date: 03/23/2013 Time: 1010-1110 and 1410-1455 Time Calculation (min): 60 min and 45 min  Patient has met 1 of 4 short term goals. Pt progressing toward unmet goals addressing the following: bed mobility, gait, and stair negotiation. Limited progress secondary to pt having dislocated R hip x2 episodes since initial evaluation. Due to inherent muscle weakness and abnormal tone associated with pt h/o multiple sclerosis, pt has difficulty adhering to R posterior hip precautions. Pt demonstrates improved knowledge and awareness of precautions. Hip abduction orthosis obtained to prevent future risk of R hip dislocation.  Added goal for pt providing accurate instruction of donning/doffing R hip abduction orthosis. Also initiated use of leg lifter to facilitate pt independence with bed mobility. Endurance improved, as exhibited by pt participation without need for supplemental O2.  Patient continues to demonstrate the following deficits: muscle weakness, decreased postural/gait stability, and abnormal tone and therefore will continue to benefit from skilled PT intervention to enhance overall performance with activity tolerance, balance, postural control, ability to compensate for deficits, functional use of  right lower extremity and awareness.  Patient progressing toward long term goals..  Continue plan of care.  PT Short Term Goals Week 1:  PT Short Term Goal 1 (Week 1): Pt to consistently perform bed mobility with supervision using rails with HOB flat. PT Short Term Goal 1 - Progress (Week 1): Progressing toward goal PT Short Term Goal 2 (Week 1): Pt to perform bed<>chair transfer with min A and rolling walker. PT Short Term Goal 2 - Progress (Week 1): Met PT Short Term Goal 3 (Week 1): Pt to perform gait x50' with supervision using LRAD. PT Short Term Goal 3 - Progress  (Week 1): Progressing toward goal PT Short Term Goal 4 (Week 1): Pt to negotiate 4 steps with R rail requiring mod A. PT Short Term Goal 4 - Progress (Week 1): Progressing toward goal Week 2:  PT Short Term Goal 1 (Week 2): Pt to consistently perform bed mobility with supervision using rails with HOB flat. PT Short Term Goal 2 (Week 2): Pt to perform bed<>chair transfer with supervision. PT Short Term Goal 3 (Week 2): Pt to perform gait x50' with supervision using LRAD.  PT Short Term Goal 4 (Week 2): Pt to negotiate 4 steps with R rail requiring min A. PT Short Term Goal 5 (Week 2): Pt to provide accurate instruction of proper technique for donning/doffing R hip abduction orthosis.  Skilled Therapeutic Interventions/Progress Updates:    Treatment Session 1:  Pt received seated in rollator seat with daughter present; agreeable to session. R hip abduction orthosis on. Rec therapist and this PT educated pt on importance of, methods for energy conservation. Pt verbalized understanding. Gait x34', x21', x30' in controlled environment with min guard, verbal cueing for R gluteus maximus activation. All gait trials ended secondary to onset of R genu recurvatum during R midstance. Stand-pivot from rollator seat>w/c with min A. Therapist departed with pt seated in w/c with all needs within reach and R hip abduction orthosis on.  Treatment Session 2: Pt received seated in w/c with daughter present. Agreeable to session. R hip orthosis noted to be misaligned. Performed stand-pivot from w/c>bed with rollator, min A to maintain R hip rotation at neutral. Sit<>supine using leg lifter with supervision, bed rails, HOB elevated, increased time, and max verbal cueing for technique. Once in supine, PT adjusted R  hip orthosis. Performed w/c mobility in controlled environment x150' with supervision. Negotiation of 3 stairs with R rail, sideways, with mod A for stability, placement/adjustment of RLE for adherence to hip  precautions. Returned to room, where pt performed supine>sit with leg lifter with assist as abovementioned, min cueing for technique. Therapist departed with pt semi-reclined in bed with R KI and bilat Prevalon boots on and all needs within reach.   Therapy Documentation Precautions:  Precautions Precautions: Fall;Posterior Hip;Other (comment) Precaution Booklet Issued: No Precaution Comments: Pt able to independently verbalize all posterior hip precautions. Required Braces or Orthoses: Knee Immobilizer - Right Knee Immobilizer - Right: On at all times;Other (comment) Restrictions Weight Bearing Restrictions: Yes RLE Weight Bearing: Non weight bearing Other Position/Activity Restrictions: per Marissa Nestle, PA Vital Signs: Therapy Vitals Temp: 98.8 F (37.1 C) Temp src: Oral Pulse Rate: 78 Resp: 18 BP: 107/67 mmHg Patient Position, if appropriate: Lying Oxygen Therapy SpO2: 100 % O2 Device: None (Room air) Pain:  No c/p pain during AM/PM sessions. Locomotion : Wheelchair Mobility Distance: 150   See FIM for current functional status  Therapy/Group: Individual Therapy  Calvert Cantor 03/23/2013, 4:03 PM

## 2013-03-24 NOTE — Plan of Care (Signed)
Problem: RH BLADDER ELIMINATION Goal: RH STG MANAGE BLADDER WITH ASSISTANCE STG Manage Bladder With min Assistance  Outcome: Not Progressing Requiring I&O cath q 8hrs

## 2013-03-24 NOTE — Plan of Care (Signed)
Problem: RH SKIN INTEGRITY Goal: RH STG SKIN FREE OF INFECTION/BREAKDOWN No new skin breakdown or skin infection while in rehab  Outcome: Progressing No skin breakdown noted

## 2013-03-24 NOTE — Plan of Care (Signed)
Problem: RH PAIN MANAGEMENT Goal: RH STG PAIN MANAGED AT OR BELOW PT'S PAIN GOAL 3 or less on scale 0-10  Outcome: Progressing Pain managed with Tylenol 650mg  q 4hrs. Not requesting any at this time.

## 2013-03-24 NOTE — Progress Notes (Addendum)
Subjective/Complaints: Slept well without sig hip pain Bowels starting to move but still req I/O caths. Pain under control. Using tylenol primarily. A 12 point review of systems has been performed and if not noted above is otherwise negative.   Objective: Vital Signs: Blood pressure 106/62, pulse 66, temperature 98.2 F (36.8 C), temperature source Oral, resp. rate 18, height 5\' 3"  (1.6 m), weight 65.5 kg (144 lb 6.4 oz), SpO2 98.00%.  No results found for this basename: WBC, HGB, HCT, PLT,  in the last 72 hours No results found for this basename: NA, K, CL, CO, GLUCOSE, BUN, CREATININE, CALCIUM,  in the last 72 hours CBG (last 3)  No results found for this basename: GLUCAP,  in the last 72 hours  Wt Readings from Last 3 Encounters:  03/15/13 65.5 kg (144 lb 6.4 oz)  03/15/13 65.5 kg (144 lb 6.4 oz)  03/13/13 63.2 kg (139 lb 5.3 oz)    Physical Exam:  Constitutional: She is oriented to person, place, and time. She appears well-developed and well-nourished.  HENT:  Head: Normocephalic and atraumatic.  Eyes: Pupils are equal, round, and reactive to light.  Neck: Neck supple.  Cardiovascular: Normal rate and regular rhythm.  Respiratory: Effort normal. She has normal breath sounds.  GI: Soft. Bowel sounds are normal. She exhibits no distension. There is no tenderness. There is no rebound and no guarding.  Musculoskeletal:  Leg length equal no int rotation deformity on Right side. Neurological: She is alert and oriented to person, place, and time. Coordination abnormal.  Upper ext 5/5 bilaterally. Left lower 3+ HF to 4- KE, to 4 ankle. RLE is 1+ HF, 2 KE, 3/5 ADF, 3+ APF. No sensory loss to LT  Skin: Skin is warm and dry.  Psychiatric: she is anxious   Assessment/Plan: 1. Functional deficits secondary to right FNF s/p hip hemi and subsequent dislocation. Pt also has MS. Her conditions require 3+ hours per day of interdisciplinary therapy in a comprehensive inpatient rehab  setting. Physiatrist is providing close team supervision and 24 hour management of active medical problems listed below. Physiatrist and rehab team continue to assess barriers to discharge/monitor patient progress toward functional and medical goals. Received hip abduction brace--comfortable and able to amb with this  FIM: FIM - Bathing Bathing Steps Patient Completed: Chest;Right Arm;Left Arm;Left upper leg;Abdomen;Right upper leg;Front perineal area;Buttocks;Right lower leg (including foot);Left lower leg (including foot) Bathing: 4: Steadying assist  FIM - Upper Body Dressing/Undressing Upper body dressing/undressing steps patient completed: Thread/unthread left sleeve of pullover shirt/dress;Put head through opening of pull over shirt/dress;Pull shirt over trunk;Thread/unthread right sleeve of pullover shirt/dresss;Thread/unthread right bra strap;Thread/unthread left bra strap;Hook/unhook bra Upper body dressing/undressing: 5: Set-up assist to: Obtain clothing/put away FIM - Lower Body Dressing/Undressing Lower body dressing/undressing steps patient completed: Thread/unthread right underwear leg;Pull underwear up/down;Thread/unthread right pants leg;Thread/unthread left pants leg;Pull pants up/down;Thread/unthread left underwear leg Lower body dressing/undressing: 3: Mod-Patient completed 50-74% of tasks  FIM - Musician Devices: Grab bar or rail for support Toileting: 0: Activity did not occur  FIM - Radio producer Devices: Insurance account manager Transfers: 0-Activity did not occur  FIM - Control and instrumentation engineer Devices: Bed rails;Orthosis;HOB elevated;Walker;Arm rests (rollator; hip abduction brace; leg lifter) Bed/Chair Transfer: 5: Supine > Sit: Supervision (verbal cues/safety issues);5: Sit > Supine: Supervision (verbal cues/safety issues);4: Bed > Chair or W/C: Min A (steadying Pt. > 75%);4: Chair or W/C > Bed: Min A  (steadying Pt. > 75%)  FIM -  Locomotion: Wheelchair Distance: 150 Locomotion: Wheelchair: 5: Travels 150 ft or more: maneuvers on rugs and over door sills with supervision, cueing or coaxing FIM - Locomotion: Ambulation Locomotion: Ambulation Assistive Devices: Other (comment) (rollator) Ambulation/Gait Assistance: 4: Min guard Locomotion: Ambulation: 0: Activity did not occur  Comprehension Comprehension Mode: Auditory Comprehension: 6-Follows complex conversation/direction: With extra time/assistive device  Expression Expression Mode: Verbal Expression: 6-Expresses complex ideas: With extra time/assistive device  Social Interaction Social Interaction: 6-Interacts appropriately with others with medication or extra time (anti-anxiety, antidepressant).  Problem Solving Problem Solving: 5-Solves basic problems: With no assist  Memory Memory: 6-More than reasonable amt of time  Medical Problem List and Plan:  1. DVT Prophylaxis/Anticoagulation: Pharmaceutical: Lovenox  2. Pain Management: Prn tylenol and/or baclofen.  -tylenol -will use the hydrocodone at night to help with sleep and pain -low dose hydrocodone prn if needed. 3. Mood: Provide ego support for high levels of anxiety. Educate on hip precautions.   -anxiety not helping with hip pain 4. Neuropsych: This patient is capable of making decisions on her own behalf.  5. MS with neurogenic bladder: Recheck UA/UCS negative   -IC cath prn. Added urecholine Very small infreq voids -needs timed voiding schedule -augment bowel meds/program--- 6. Hypoxia: Aspiration PNA has been treated.  -Encourage IS with flutter valve.  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  -wean O2 as able 8. Leukocytosis: resolved 9. ABLA: Has improved post transfusion.  LOS (Days) 9 A FACE TO FACE EVALUATION WAS PERFORMED  Charlett Blake 03/24/2013 8:51 AM

## 2013-03-24 NOTE — Plan of Care (Signed)
Problem: RH SKIN INTEGRITY Goal: RH STG ABLE TO PERFORM INCISION/WOUND CARE W/ASSISTANCE STG Able To Perform Incision/Wound Care With max Assistance.  Outcome: Progressing Incision healing appropriately.

## 2013-03-24 NOTE — Plan of Care (Signed)
Problem: RH BLADDER ELIMINATION Goal: RH STG MANAGE BLADDER WITH EQUIPMENT WITH ASSISTANCE STG Manage Bladder With Equipment With min Assistance  Outcome: Not Progressing Requiring I&O cath q 8hrs

## 2013-03-25 ENCOUNTER — Inpatient Hospital Stay (HOSPITAL_COMMUNITY): Payer: 59 | Admitting: Physical Therapy

## 2013-03-25 ENCOUNTER — Inpatient Hospital Stay (HOSPITAL_COMMUNITY): Payer: 59

## 2013-03-25 NOTE — Progress Notes (Signed)
Nursing Note: No void.A:Cathed for 350 cc.by [email or web address]

## 2013-03-25 NOTE — Progress Notes (Signed)
Nursing Note: No void.A: Cathed for 425 cc by [email or web address]

## 2013-03-25 NOTE — Plan of Care (Signed)
Problem: RH BLADDER ELIMINATION Goal: RH STG MANAGE BLADDER WITH ASSISTANCE STG Manage Bladder With min Assistance  Outcome: Not Progressing On urecholine and requires in and out caths by staff.[email or web address]

## 2013-03-25 NOTE — Progress Notes (Signed)
Occupational Therapy Session Note  Patient Details  Name: Kayla [name] MRN: [id number] Date of Birth: [birth year]  Today's Date: 03/25/2013 Time: 1000-1105 and 8616-8372 Time Calculation (min): 65 min and 30 min   Short Term Goals: Week 2:  OT Short Term Goal 1 (Week 2): Focus on LTGs  Skilled Therapeutic Interventions/Progress Updates:    Session 1: Pt seen for ADL retaining with focus on toilet task/transfer with RLE braces, sit<>stand, standing balance, and activity tolerance. Began session discussing toilet task/transfer as pt is unable to manage clothing down enough while wearing hip abduction orthosis. Discussed wearing KI for task as can manage clothing with that while also protecting her hip. Pt reported she had no problem having husband assist with switching orthosis before and after task and aware that it will take increased time. Therapist doffed hip abduction orthosis then donned KI and pt ambulated approx 8 feet with rollator and min guard assist to simulate distance she would walk in bathroom. Completed toilet task and transfer safely with steadying assist. Discussed keeping toilet paper in elevated spot to prevent breaking hip precautions and also wiping techniques. Pt with good carryover. Pt then completed sponge bath at sink with supervision-steadying assist while in standing. Therapist assisted with bil socks and feet for time purposes. At end of session therapist assist pt back to bed with min assist and donned KI and bil Prevalon boots and left with all needs in reach.   Session 2: Therapy session focused on functional transfers while adhering to precautions. Pt received supine in bed requesting to complete toilet task. Ambulated short distance with KI donned using rollator at min guard-close supervision. Completed toilet task at supervision level and steadying assist during turns to/from toilet. Practiced tub transfer using TTB and leg lifter. Provided visual demonstration then pt  able to carryover with steadying assist and increased. Pt very cautious of precautions throughout transfer and demonstrated good safety. Pt is going to have husband measure height of tub at home so pt can practice lifting BLE over that height. Pt reports she will be taking showers immediately after waking up and inquired about leaving KI donned while she transfers from bedroom to bathroom (approx 15 feet). Therapist agreeable as pt is consistently demonstrating good awareness of positioning of RLE and precautions throughout all sessions. Will discuss with husband as well. At end of session pt returned to room and left supine in bed with all needs in reach. Pt wearing KI and bil Prevalon boot.  Therapy Documentation Precautions:  Precautions Precautions: Fall;Posterior Hip;Other (comment) Precaution Booklet Issued: No Precaution Comments: Pt able to independently verbalize all posterior hip precautions. Required Braces or Orthoses: Knee Immobilizer - Right Knee Immobilizer - Right: On at all times;Other (comment) Restrictions Weight Bearing Restrictions: Yes RLE Weight Bearing: Non weight bearing Other Position/Activity Restrictions: per Algis Liming, PA General:   Vital Signs:   Pain: Pt with no c/o pain during therapy sessions.   See FIM for current functional status  Therapy/Group: Individual Therapy  Duayne Cal 03/25/2013, 12:31 PM

## 2013-03-25 NOTE — Progress Notes (Signed)
Physical Therapy Session Note  Patient Details  Name: Kayla [name] MRN: [id number] Date of Birth: 08-Jul-1950  Today's Date: 03/25/2013 Time: 0800-0900  60 min  Short Term Goals: Week 2:  PT Short Term Goal 1 (Week 2): Pt to consistently perform bed mobility with supervision using rails with HOB flat. PT Short Term Goal 2 (Week 2): Pt to perform bed<>chair transfer with supervision. PT Short Term Goal 3 (Week 2): Pt to perform gait x50' with supervision using LRAD.  PT Short Term Goal 4 (Week 2): Pt to negotiate 4 steps with R rail requiring min A. PT Short Term Goal 5 (Week 2): Pt to provide accurate instruction of proper technique for donning/doffing R hip abduction orthosis.  Skilled Therapeutic Interventions/Progress Updates:    Pt received semi-reclined in bed; agreeable to session. In supine, donned R hip abduction orthosis; pt provided verbal instruction, recalling ~75% of bony landmarks for brace alignment). Supine>sit with HOB flat, bed rail, min A; leg lifter for RLE management. Gait x62' (standing rest breaks x2) and x72' (standing rest break x1; SpO2 98%, HR 106) with rollator in controlled environment with min guard (min A to adjust RLE to neutral rotation x2 episodes). Second gait trial ended secondary to onset of R genu recurvatum during R midstance. Overall, posture markedly improved; less cueing required to maintain bilat hip extension throughout gait cycle. Focused on diaphragmatic breathing during standing rest breaks.Therapist departed with pt seated on (locked) rollator seat awaiting upcoming speech therapy session; bilat LE's supported on wooden riser to maintain RLE position. All needs within reach and R hip abduction orthosis on.   Therapy Documentation Precautions:  Precautions Precautions: Fall;Posterior Hip;Other (comment) Precaution Booklet Issued: No Precaution Comments: Pt able to independently verbalize all posterior hip precautions. Required Braces or Orthoses:  Knee Immobilizer - Right Knee Immobilizer - Right: On at all times;Other (comment) Restrictions Weight Bearing Restrictions: Yes RLE Weight Bearing: Non weight bearing Other Position/Activity Restrictions: per Algis Liming, PA Pain: Pt reports no pain during session.  See FIM for current functional status  Therapy/Group: Individual Therapy  Smith Mcnicholas, Malva Cogan 03/25/2013, 6:54 PM

## 2013-03-25 NOTE — Progress Notes (Signed)
Subjective/Complaints: Slept well without sig hip pain Bowels starting to move but still req I/O caths.  A 12 point review of systems has been performed and if not noted above is otherwise negative.   Objective: Vital Signs: Blood pressure 104/68, pulse 70, temperature 98.3 F (36.8 C), temperature source Oral, resp. rate 16, height 5\' 3"  (1.6 m), weight 65.5 kg (144 lb 6.4 oz), SpO2 96.00%.  No results found for this basename: WBC, HGB, HCT, PLT,  in the last 72 hours No results found for this basename: NA, K, CL, CO, GLUCOSE, BUN, CREATININE, CALCIUM,  in the last 72 hours CBG (last 3)  No results found for this basename: GLUCAP,  in the last 72 hours  Wt Readings from Last 3 Encounters:  03/15/13 65.5 kg (144 lb 6.4 oz)  03/15/13 65.5 kg (144 lb 6.4 oz)  03/13/13 63.2 kg (139 lb 5.3 oz)    Physical Exam:  Constitutional: She is oriented to person, place, and time. She appears well-developed and well-nourished.  HENT:  Head: Normocephalic and atraumatic.  Eyes: Pupils are equal, round, and reactive to light.  Neck: Neck supple.  Cardiovascular: Normal rate and regular rhythm.  Respiratory: Effort normal. She has normal breath sounds.  GI: Soft. Bowel sounds are normal. She exhibits no distension. There is no tenderness. There is no rebound and no guarding.  Musculoskeletal:  Leg length equal no int rotation deformity on Right side. Neurological: She is alert and oriented to person, place, and time. Coordination abnormal.  Upper ext 5/5 bilaterally. Left lower 3+ HF to 4- KE, to 4 ankle. RLE is 1+ HF, 2 KE, 3/5 ADF, 3+ APF. No sensory loss to LT  Skin: Skin is warm and dry.  Psychiatric: she is anxious   Assessment/Plan: 1. Functional deficits secondary to right FNF s/p hip hemi and subsequent dislocation. Pt also has MS. Her conditions require 3+ hours per day of interdisciplinary therapy in a comprehensive inpatient rehab setting. Physiatrist is providing close team  supervision and 24 hour management of active medical problems listed below. Physiatrist and rehab team continue to assess barriers to discharge/monitor patient progress toward functional and medical goals. Received hip abduction brace--comfortable and able to amb with this  FIM: FIM - Bathing Bathing Steps Patient Completed: Chest;Right Arm;Left Arm;Left upper leg;Abdomen;Right upper leg;Front perineal area;Buttocks;Right lower leg (including foot);Left lower leg (including foot) Bathing: 4: Steadying assist  FIM - Upper Body Dressing/Undressing Upper body dressing/undressing steps patient completed: Thread/unthread left sleeve of pullover shirt/dress;Put head through opening of pull over shirt/dress;Pull shirt over trunk;Thread/unthread right sleeve of pullover shirt/dresss;Thread/unthread right bra strap;Thread/unthread left bra strap;Hook/unhook bra Upper body dressing/undressing: 5: Set-up assist to: Obtain clothing/put away FIM - Lower Body Dressing/Undressing Lower body dressing/undressing steps patient completed: Thread/unthread right underwear leg;Pull underwear up/down;Thread/unthread right pants leg;Thread/unthread left pants leg;Pull pants up/down;Thread/unthread left underwear leg Lower body dressing/undressing: 3: Mod-Patient completed 50-74% of tasks  FIM - Musician Devices: Grab bar or rail for support Toileting: 0: Activity did not occur  FIM - Radio producer Devices: Insurance account manager Transfers: 0-Activity did not occur  FIM - Control and instrumentation engineer Devices: Bed rails;Orthosis;HOB elevated;Walker;Arm rests (rollator; hip abduction brace; leg lifter) Bed/Chair Transfer: 5: Supine > Sit: Supervision (verbal cues/safety issues);5: Sit > Supine: Supervision (verbal cues/safety issues);4: Bed > Chair or W/C: Min A (steadying Pt. > 75%);4: Chair or W/C > Bed: Min A (steadying Pt. > 75%)  FIM - Locomotion:  Wheelchair Distance: 150 Locomotion:  Wheelchair: 5: Travels 150 ft or more: maneuvers on rugs and over door sills with supervision, cueing or coaxing FIM - Locomotion: Ambulation Locomotion: Ambulation Assistive Devices: Other (comment) (rollator) Ambulation/Gait Assistance: 4: Min guard Locomotion: Ambulation: 0: Activity did not occur  Comprehension Comprehension Mode: Auditory Comprehension: 6-Follows complex conversation/direction: With extra time/assistive device  Expression Expression Mode: Verbal Expression: 6-Expresses complex ideas: With extra time/assistive device  Social Interaction Social Interaction: 6-Interacts appropriately with others with medication or extra time (anti-anxiety, antidepressant).  Problem Solving Problem Solving: 5-Solves basic problems: With no assist  Memory Memory: 6-Assistive device: No helper  Medical Problem List and Plan:  1. DVT Prophylaxis/Anticoagulation: Pharmaceutical: Lovenox  2. Pain Management: Prn tylenol and/or baclofen.  -tylenol -will use the hydrocodone at night to help with sleep and pain -low dose hydrocodone prn if needed. 3. Mood: Provide ego support for high levels of anxiety. Educate on hip precautions.   -anxiety not helping with hip pain 4. Neuropsych: This patient is capable of making decisions on her own behalf.  5. MS with neurogenic bladder: Recheck UA/UCS negative   -IC cath prn. Added urecholine Very small infreq voids -needs timed voiding schedule -augment bowel meds/program--- 6. Hypoxia: Aspiration PNA has been treated.  -Encourage IS with flutter valve.  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  -wean O2 as able 8. Leukocytosis: resolved 9. ABLA: Has improved post transfusion.may d/c PICC  LOS (Days) 10 A FACE TO FACE EVALUATION WAS PERFORMED  Charlett Blake 03/25/2013 7:57 AM

## 2013-03-25 NOTE — Progress Notes (Signed)
Speech Language Pathology Daily Session Note  Patient Details  Name: Kayla [name] MRN: [id number] Date of Birth: 07-02-1950  Today's Date: 03/25/2013 Time: 0915-1000 Time Calculation (min): 45 min  Short Term Goals: Week 2: SLP Short Term Goal 1 (Week 2): Pt will utilize safe swallow strategies with regular textures and thin liquids with supervision cues to reduce overt s/s of aspiration SLP Short Term Goal 2 (Week 2): Pt will demonstrate adequate diaphragmatic breathing for increased breath support at the phrase level with Min cues  Skilled Therapeutic Interventions: Skilled treatment focused on swallowing and diaphragmatic breathing goals. SLP facilitated session with skilled observation of thin liquid trials and medication administration by RN. Pt exhibited cough x1 with cup sip of water, as well as cough x1 with large pill, using a chin tuck strategy to facilitate pharyngeal clearance with Mod I. Pt remains afebrile with "normal breath sounds" per most recent MD note. Will continue to follow for diet tolerance. Pt demonstrated adequate problem solving and anticipatory awareness, saying that she should take her larger pills first before she fatigues. SLP introduced diaphragmatic breathing trials today. Pt unable to perform task today despite Max cues and education, but was encouraged to practice throughout the day while sitting or lying down. Pt may benefit from simplified cueing. Continue plan of care.   FIM:  Comprehension Comprehension Mode: Auditory Comprehension: 6-Follows complex conversation/direction: With extra time/assistive device Expression Expression Mode: Verbal Expression: 6-Expresses complex ideas: With extra time/assistive device Social Interaction Social Interaction: 6-Interacts appropriately with others with medication or extra time (anti-anxiety, antidepressant). Problem Solving Problem Solving: 5-Solves basic problems: With no assist Memory Memory: 6-More than  reasonable amt of time FIM - Eating Eating Activity: 5: Supervision/cues  Pain Pain Assessment Pain Assessment: 0-10 Pain Score: 2  Pain Type: Acute pain Pain Location: Hip Pain Orientation: Right Pain Descriptors / Indicators: Aching Pain Frequency: Occasional Pain Onset: Gradual Patients Stated Pain Goal: 2 Pain Intervention(s): Medication (See eMAR) (vicodin 1 po)  Therapy/Group: Individual Therapy   Kayla [name], M.A. CCC-SLP [phone]   Kayla [name] 03/25/2013, 11:49 AM

## 2013-03-26 ENCOUNTER — Inpatient Hospital Stay (HOSPITAL_COMMUNITY): Payer: 59 | Admitting: Physical Therapy

## 2013-03-26 ENCOUNTER — Inpatient Hospital Stay (HOSPITAL_COMMUNITY): Payer: 59 | Admitting: Occupational Therapy

## 2013-03-26 ENCOUNTER — Encounter (HOSPITAL_COMMUNITY): Payer: 59 | Admitting: Occupational Therapy

## 2013-03-26 DIAGNOSIS — G35 Multiple sclerosis: Secondary | ICD-10-CM

## 2013-03-26 DIAGNOSIS — G35D Multiple sclerosis, unspecified: Secondary | ICD-10-CM

## 2013-03-26 DIAGNOSIS — Z5189 Encounter for other specified aftercare: Secondary | ICD-10-CM

## 2013-03-26 DIAGNOSIS — S73006A Unspecified dislocation of unspecified hip, initial encounter: Secondary | ICD-10-CM

## 2013-03-26 DIAGNOSIS — J189 Pneumonia, unspecified organism: Secondary | ICD-10-CM

## 2013-03-26 NOTE — Progress Notes (Signed)
Physical Therapy Session Note  Patient Details  Name: Kayla [name] MRN: [id number] Date of Birth: 01-04-1951  Today's Date: 03/26/2013 Time: 9242-6834 Time Calculation (min): 45 min  Therapy Documentation Precautions:  Precautions Precautions: Fall;Posterior Hip;Other (comment) Precaution Booklet Issued: No Precaution Comments: Pt able to independently verbalize all posterior hip precautions. Required Braces or Orthoses: Knee Immobilizer - Right Knee Immobilizer - Right: On at all times;Other (comment) Restrictions Weight Bearing Restrictions: Yes RLE Weight Bearing: Non weight bearing Other Position/Activity Restrictions: per Algis Liming, PA Pain: denies pain currently. Patient resting comfortably, supine in hospital bed with R hip abductor brace in place.   Therapeutic Activity:(15') assisted with donning tennis shoes while in bed, Transfer from supine to sitting EOB with HOB elevated S/Mod-I with patient using bed rail as well as leg lifter for R LE.              Transfer sitting EOB to upright in Rollator from elevated surface with CGA/min-assist provided for R LE as directed by patient to support R LE from rotating inward. Subsequent sit<-stand transfers were with S/Mod-I. Gait Training:(15') using rollator 1 x 62' with patient pacing herself and requesting to sit when beginning to feel fatigue. Patient with good foot clearance and step through gait. Discussed and reviewed breathing techniques with patient able to return demonstration for appropriate application of pursed lip breathing as well as using incentive spirometer correctly. Therapeutic Exercise:(15') using 2# dumbells for variety of seated UE exercises with rest breaks    Therapy/Group: Individual Therapy  Isidoro Santillana J 03/26/2013, 3:11 PM

## 2013-03-26 NOTE — Progress Notes (Signed)
Patient ID: Kayla [name], female   DOB: 1950/06/07, 63 y.o.   MRN: [id number]    Subjective/Complaints: 19/54.  63 year old patient with functional deficits secondary to right FNF who is status post left femoral neck fracture which has been complicated by left hip dislocation. Stable medical problems include  MS complicated by urinary retention. She has a history of HCAP.  She remains on Lovenox DVT prophylaxis. PICC line was discontinued yesterday. Slept well without sig hip pain Bowels starting to move but still req I/O caths.   Objective: Vital Signs: Blood pressure 122/67, pulse 67, temperature 98.4 F (36.9 C), temperature source Oral, resp. rate 17, height 5\' 3"  (1.6 m), weight 65.5 kg (144 lb 6.4 oz), SpO2 97.00%.  No results found for this basename: WBC, HGB, HCT, PLT,  in the last 72 hours No results found for this basename: NA, K, CL, CO, GLUCOSE, BUN, CREATININE, CALCIUM,  in the last 72 hours CBG (last 3)  No results found for this basename: GLUCAP,  in the last 72 hours  Wt Readings from Last 3 Encounters:  03/15/13 65.5 kg (144 lb 6.4 oz)  03/15/13 65.5 kg (144 lb 6.4 oz)  03/13/13 63.2 kg (139 lb 5.3 oz)    Intake/Output Summary (Last 24 hours) at 03/26/13 0927 Last data filed at 03/26/13 0830  Gross per 24 hour  Intake    840 ml  Output   2298 ml  Net  -1458 ml   Patient Vitals for the past 24 hrs:  BP Temp Temp src Pulse Resp SpO2  03/26/13 0741 - - - - - 97 %  03/26/13 0533 122/67 mmHg 98.4 F (36.9 C) Oral 67 17 94 %  03/25/13 2011 - - - - - 96 %  03/25/13 1424 99/63 mmHg 98.2 F (36.8 C) Oral 76 16 97 %    Physical Exam:  Constitutional: She is oriented to person, place, and time. She appears well-developed and well-nourished.  HENT:  Head: Normocephalic and atraumatic.  Eyes: Pupils are equal, round, and reactive to light.  Neck: Neck supple.  Cardiovascular: Normal rate and regular rhythm.  Respiratory: Effort normal. She has normal breath sounds  Except for rare scattered rhonchi GI: Soft. Bowel sounds are normal. She exhibits no distension. There is no tenderness. There is no rebound and no guarding.  Musculoskeletal:  Leg length equal no int rotation deformity on Right side. Neurological: She is alert and oriented to person, place, and time. Coordination abnormal.   no edema; ankle braces in place  Assessment/Plan: 1. Functional deficits secondary to right FNF s/p hip hemi and subsequent dislocation. Pt also has MS. Received hip abduction brace--comfortable and able to amb with this 2. MS with neurogenic bladder  3. status post aspiration pneumonia   LOS (Days) 11 A FACE TO FACE EVALUATION WAS PERFORMED  Kayla [name] 03/26/2013 9:22 AM

## 2013-03-26 NOTE — Progress Notes (Signed)
Physical Therapy Note  Patient Details  Name: Kayla [name] MRN: [id number] Date of Birth: 09-Nov-1950 Today's Date: 03/26/2013  1500-1530 (30 minutes) individual (missed 15 minutes/fatigue) Pain: no complaint of pain Focus of treatment: wc mobility for activity tolerance; gait training Treatment: Pt up in wc with abduction brace in place Rt LE; wc mobility 120 feet X 2 SBA ; gait 60 feet X 2 rollator with one seated rest break; returned to room with all needs in place; family present.    Deserea Bordley,JIM 03/26/2013, 4:07 PM

## 2013-03-26 NOTE — Progress Notes (Signed)
Nursing Note: Pt unable to void.Scanned and then cathed by NT for 600cc.Pt tolerated  well.wbb

## 2013-03-26 NOTE — Progress Notes (Addendum)
Occupational Therapy Session Note  Patient Details  Name: Kayla [name] MRN: [id number] Date of Birth: 18-Nov-1950  Today's Date: 03/26/2013 Time: 2800-3491 Time Calculation (min): 41 min  Short Term Goals: Week 2:  OT Short Term Goal 1 (Week 2): Focus on LTGs  Skilled Therapeutic Interventions/Progress Updates:    Pt began session by transferring supine to sit EOB with supervision using her leg lifter to help move the RLE off of the edge of the bed.  She utilized the sockaide to donn her gripper socks with increased time at EOB.  Therapist assisted with knee high TEDs prior to gripper socks.  Pt transferred to the [facility] over the toilet with min assist using the rollator.  Pt was able to void and have a BM on the toilet.  She voiced feeling good because it is the first time she has been able to void on her own since earlier December.  Nursing also made aware of her voiding.  Pt transferred from the [facility] to the sink to wash her hands with min assist.  Returned to bed to work on [place] from RLE and donning hip abduction brace.  Pt able to provide verbal instruction to sequencing and donning of brace in supine position.    Therapy Documentation Precautions:  Precautions Precautions: Fall;Posterior Hip;Other (comment) Precaution Booklet Issued: No Precaution Comments: Pt able to independently verbalize all posterior hip precautions. Required Braces or Orthoses: Knee Immobilizer - Right Knee Immobilizer - Right: On at all times;Other (comment) Restrictions Weight Bearing Restrictions: Yes RLE Weight Bearing: WBAT Other Position/Activity Restrictions: per Algis Liming, PA  Pain: Pain Assessment Pain Assessment: Faces Pain Score: 0-No pain Faces Pain Scale: Hurts a little bit Pain Type: Surgical pain Pain Location: Hip Pain Orientation: Right Pain Intervention(s): Repositioned ADL: See FIM for current functional status  Therapy/Group: Individual Therapy  Sharnita Bogucki OTR/L 03/26/2013,  1:54 PM

## 2013-03-26 NOTE — Progress Notes (Signed)
Nursing Note: Pt unable to void.Scanned and cathed for 630 cc by NT.P tolerated well.wbb

## 2013-03-26 NOTE — Progress Notes (Addendum)
Occupational Therapy Session Note  Patient Details  Name: Kayla [name] MRN: [id number] Date of Birth: 06/26/1950  Today's Date: 03/26/2013 Time: 1030-1130 Time Calculation (min): 60 min  Short Term Goals: Week 2:  OT Short Term Goal 1 (Week 2): Focus on LTGs  Skilled Therapeutic Interventions/Progress Updates:    Pt began session by transferring from her bed to the bedside commode placed over the toilet for toileting.  She was able to transfer with min assist overall using the rollator.  Transitioned from the toilet to the wheelchair in front of the sink with min assist as well.  Pt integrated AE for LB bathing and dressing.  Therapist assisted with removing KI so pt could wash and dress her RLE.  Transferred back to bed at completion of bathing with min assist using rollator.  Pt used leg lifter for placing her RLE in the bed as well.  PRAFOs positioned bilaterally.   Therapy Documentation Precautions:  Precautions Precautions: Fall;Posterior Hip;Other (comment) Precaution Booklet Issued: No Precaution Comments: Pt able to independently verbalize all posterior hip precautions. Required Braces or Orthoses: Knee Immobilizer - Right Knee Immobilizer - Right: On at all times;Other (comment) Restrictions Weight Bearing Restrictions: Yes RLE Weight Bearing: WBAT Other Position/Activity Restrictions: per Algis Liming, PA  Pain: Pain Assessment Pain Assessment: Faces Pain Score: 2  Faces Pain Scale: Hurts a little bit Pain Type: Surgical pain Pain Location: Hip Pain Orientation: Right Pain Intervention(s): Repositioned ADL: See FIM for current functional status  Therapy/Group: Individual Therapy  Mariluz Crespo OTR/L 03/26/2013, 12:16 PM

## 2013-03-27 NOTE — Progress Notes (Signed)
Patient ID: Kayla [name], female   DOB: 1950/11/06, 63 y.o.   MRN: [id number]  Patient ID: Kayla [name], female   DOB: May 03, 1950, 63 y.o.   MRN: [id number]    Subjective/Complaints:  45/34.   63 year old patient with functional deficits secondary to left femoral neck fracture which has been complicated by left hip dislocation. Stable medical problems include  MS complicated by urinary retention. She has a history of HCAP.  She remains on Lovenox DVT prophylaxis. PICC line was discontinued yesterday. Slept well without sig hip pain Bowels starting to move but still req I/O caths.  Blood pressure has below for the past 24 hours  Objective: Vital Signs: Blood pressure 96/58, pulse 80, temperature 98.2 F (36.8 C), temperature source Oral, resp. rate 18, height 5\' 3"  (1.6 m), weight 65.5 kg (144 lb 6.4 oz), SpO2 97.00%.  No results found for this basename: WBC, HGB, HCT, PLT,  in the last 72 hours No results found for this basename: NA, K, CL, CO, GLUCOSE, BUN, CREATININE, CALCIUM,  in the last 72 hours CBG (last 3)  No results found for this basename: GLUCAP,  in the last 72 hours  Wt Readings from Last 3 Encounters:  03/15/13 65.5 kg (144 lb 6.4 oz)  03/15/13 65.5 kg (144 lb 6.4 oz)  03/13/13 63.2 kg (139 lb 5.3 oz)    Intake/Output Summary (Last 24 hours) at 03/27/13 1007 Last data filed at 03/27/13 0720  Gross per 24 hour  Intake    720 ml  Output   1500 ml  Net   -780 ml   Patient Vitals for the past 24 hrs:  BP Temp Temp src Pulse Resp SpO2  03/27/13 0803 96/58 mmHg - - 80 - -  03/27/13 0752 - - - - - 97 %  03/27/13 0520 113/70 mmHg 98.2 F (36.8 C) Oral 65 18 97 %  03/26/13 1530 116/82 mmHg 98.2 F (36.8 C) Oral 73 17 94 %    Physical Exam:  Constitutional: She is oriented to person, place, and time. She appears well-developed and well-nourished.  HENT:  Head: Normocephalic and atraumatic.  Eyes: Pupils are equal, round, and reactive to light.  Neck: Neck supple.   Cardiovascular: Normal rate and regular rhythm.  Respiratory: Effort normal. She has normal breath sounds Except for rare scattered rhonchi GI: Soft. Bowel sounds are normal. She exhibits no distension. There is no tenderness. There is no rebound and no guarding.  Musculoskeletal:  Leg length equal no int rotation deformity on Right side. Neurological: She is alert and oriented to person, place, and time. Coordination abnormal.   no edema; ankle braces in place  Assessment/Plan: 1. Functional deficits secondary to right FNF s/p hip hemi and subsequent dislocation. Pt also has MS. Received hip abduction brace--comfortable and able to amb with this 2. MS with neurogenic bladder  3. status post aspiration pneumonia  4.  Hypotension/ history of diastolic HF.  Will hold Lasix and watch closely for signs of fluid overload  LOS (Days) 12 A FACE TO FACE EVALUATION WAS PERFORMED  [name] 03/27/2013 10:07 AM

## 2013-03-27 NOTE — Plan of Care (Signed)
Problem: RH BLADDER ELIMINATION Goal: RH STG MANAGE BLADDER WITH MEDICATION WITH ASSISTANCE STG Manage Bladder With Medication With min Assistance.  Outcome: Not Progressing I/o cath q 6 hrs due to urinary retention.adm

## 2013-03-28 ENCOUNTER — Inpatient Hospital Stay (HOSPITAL_COMMUNITY): Payer: 59

## 2013-03-28 ENCOUNTER — Inpatient Hospital Stay (HOSPITAL_COMMUNITY): Payer: 59 | Admitting: Physical Therapy

## 2013-03-28 DIAGNOSIS — T84029A Dislocation of unspecified internal joint prosthesis, initial encounter: Secondary | ICD-10-CM

## 2013-03-28 DIAGNOSIS — G35 Multiple sclerosis: Secondary | ICD-10-CM

## 2013-03-28 DIAGNOSIS — S72009A Fracture of unspecified part of neck of unspecified femur, initial encounter for closed fracture: Secondary | ICD-10-CM

## 2013-03-28 DIAGNOSIS — W19XXXA Unspecified fall, initial encounter: Secondary | ICD-10-CM

## 2013-03-28 LAB — BASIC METABOLIC PANEL
BUN: 10 mg/dL (ref 6–23)
CO2: 26 mEq/L (ref 19–32)
Calcium: 9.4 mg/dL (ref 8.4–10.5)
Chloride: 100 mEq/L (ref 96–112)
Creatinine, Ser: 0.53 mg/dL (ref 0.50–1.10)
GFR calc Af Amer: >90 mL/min (ref >90)
GFR calc non Af Amer: >90 mL/min (ref >90)
Glucose, Bld: 98 mg/dL (ref 70–99)
Potassium: 3.7 mEq/L (ref 3.7–5.3)
Sodium: 138 mEq/L (ref 137–147)

## 2013-03-28 LAB — CBC
HCT: 27.9 % — ABNORMAL LOW (ref 36.0–46.0)
Hemoglobin: 9.4 g/dL — ABNORMAL LOW (ref 12.0–15.0)
MCH: 29.5 pg (ref 26.0–34.0)
MCHC: 33.7 g/dL (ref 30.0–36.0)
MCV: 87.5 fL (ref 78.0–100.0)
Platelets: 351 10*3/uL (ref 150–400)
RBC: 3.19 MIL/uL — ABNORMAL LOW (ref 3.87–5.11)
RDW: 15.7 % — ABNORMAL HIGH (ref 11.5–15.5)
WBC: 4.9 10*3/uL (ref 4.0–10.5)

## 2013-03-28 LAB — GLUCOSE, CAPILLARY: Glucose-Capillary: 85 mg/dL (ref 70–99)

## 2013-03-28 NOTE — Progress Notes (Signed)
Subjective/Complaints: Slept well without sig hip pain Bowels starting to move but still req I/O caths. Asking about hospital bed and D/C equipment           A 12 point review of systems has been performed and if not noted above is otherwise negative.   Objective: Vital Signs: Blood pressure 124/64, pulse 72, temperature 98.4 F (36.9 C), temperature source Oral, resp. rate 16, height 5\' 3"  (1.6 m), weight 65.5 kg (144 lb 6.4 oz), SpO2 99.00%.  No results found for this basename: WBC, HGB, HCT, PLT,  in the last 72 hours No results found for this basename: NA, K, CL, CO, GLUCOSE, BUN, CREATININE, CALCIUM,  in the last 72 hours CBG (last 3)  No results found for this basename: GLUCAP,  in the last 72 hours  Wt Readings from Last 3 Encounters:  03/15/13 65.5 kg (144 lb 6.4 oz)  03/15/13 65.5 kg (144 lb 6.4 oz)  03/13/13 63.2 kg (139 lb 5.3 oz)    Physical Exam:  Constitutional: She is oriented to person, place, and time. She appears well-developed and well-nourished.  HENT:  Head: Normocephalic and atraumatic.  Eyes: Pupils are equal, round, Neck: Neck supple.  Cardiovascular: Normal rate and regular rhythm.  Respiratory: Effort normal. She has normal breath sounds.  GI: Soft. Bowel sounds are normal. She exhibits no distension. There is no tenderness. There is no rebound and no guarding.  Musculoskeletal:  Leg length equal no int rotation deformity on Right side. Neurological: She is alert and oriented to person, place, and time. Coordination abnormal.  Upper ext 5/5 bilaterally. Left lower 3+ HF to 4- KE, to 4 ankle. RLE is 1+ HF, 2 KE, 3/5 ADF, 3+ APF. No sensory loss to LT  Skin: Skin is warm and dry.  Psychiatric: she is anxious   Assessment/Plan: 1. Functional deficits secondary to right FNF s/p hip hemi and subsequent dislocation. Pt also has MS. Her conditions require 3+ hours per day of interdisciplinary therapy in a comprehensive inpatient rehab setting. Physiatrist  is providing close team supervision and 24 hour management of active medical problems listed below. Physiatrist and rehab team continue to assess barriers to discharge/monitor patient progress toward functional and medical goals.   FIM: FIM - Bathing Bathing Steps Patient Completed: Chest;Right Arm;Left Arm;Abdomen;Front perineal area;Buttocks;Right upper leg;Left upper leg;Right lower leg (including foot);Left lower leg (including foot) Bathing: 4: Steadying assist  FIM - Upper Body Dressing/Undressing Upper body dressing/undressing steps patient completed: Thread/unthread right bra strap;Thread/unthread left bra strap;Hook/unhook bra;Thread/unthread right sleeve of pullover shirt/dresss;Thread/unthread left sleeve of pullover shirt/dress Upper body dressing/undressing: 5: Set-up assist to: Obtain clothing/put away FIM - Lower Body Dressing/Undressing Lower body dressing/undressing steps patient completed: Pull underwear up/down;Thread/unthread right pants leg;Thread/unthread left pants leg;Pull pants up/down;Thread/unthread right underwear leg;Thread/unthread left underwear leg;Don/Doff left sock;Don/Doff right sock Lower body dressing/undressing: 5: Set-up assist to: Don/Doff AFO/prosthesis/orthosis  FIM - Toileting Toileting steps completed by patient: Adjust clothing prior to toileting;Performs perineal hygiene;Adjust clothing after toileting Toileting Assistive Devices: Grab bar or rail for support Toileting: 5: Supervision: Safety issues/verbal cues  FIM - Radio producer Devices: Elevated toilet seat;Walker Toilet Transfers: 4-To toilet/BSC: Min A (steadying Pt. > 75%);4-From toilet/BSC: Min A (steadying Pt. > 75%)  FIM - Bed/Chair Transfer Bed/Chair Transfer Assistive Devices: Copy: 5: Supine > Sit: Supervision (verbal cues/safety issues);4: Supine > Sit: Min A (steadying Pt. > 75%/lift 1 leg)  FIM - Locomotion:  Wheelchair Distance: 150 Locomotion: Wheelchair: 0: Activity  did not occur FIM - Locomotion: Ambulation Locomotion: Ambulation Assistive Devices: Other (comment);Orthosis (rollator; hip abduction orthosis) Ambulation/Gait Assistance: 4: Min assist Locomotion: Ambulation: 2: Travels 50 - 149 ft with minimal assistance (Pt.>75%)  Comprehension Comprehension Mode: Auditory Comprehension: 6-Follows complex conversation/direction: With extra time/assistive device  Expression Expression Mode: Verbal Expression: 6-Expresses complex ideas: With extra time/assistive device  Social Interaction Social Interaction: 6-Interacts appropriately with others with medication or extra time (anti-anxiety, antidepressant).  Problem Solving Problem Solving: 5-Solves basic 90% of the time/requires cueing < 10% of the time  Memory Memory: 6-Assistive device: No helper  Medical Problem List and Plan:  1. DVT Prophylaxis/Anticoagulation: Pharmaceutical: Lovenox  2. Pain Management: Prn tylenol and/or baclofen.  -tylenol -will use the hydrocodone at night to help with sleep and pain -low dose hydrocodone prn if needed. 3. Mood: Provide ego support for high levels of anxiety. Educate on hip precautions.   -anxiety not helping with hip pain 4. Neuropsych: This patient is capable of making decisions on her own behalf.  5. MS with neurogenic bladder: Recheck UA/UCS negative   -IC cath prn. Added urecholine Very small infreq voids -needs timed voiding schedule -augment bowel meds/program--- 6. Hypoxia: Aspiration PNA has been treated.  -Encourage IS with flutter valve.  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   8. Leukocytosis: resolved 9. ABLA: resolved  LOS (Days) 13 A FACE TO FACE EVALUATION WAS PERFORMED  Urvi Imes E 03/28/2013 7:10 AM

## 2013-03-28 NOTE — Progress Notes (Signed)
Occupational Therapy Session Note  Patient Details  Name: Kayla [name] MRN: [id number] Date of Birth: [birth year]  Today's Date: 03/28/2013 Time: 5188-4166 and 0630-1601 Time Calculation (min): 55 min and 42 min   Short Term Goals: Week 2:  OT Short Term Goal 1 (Week 2): Focus on LTGs  Skilled Therapeutic Interventions/Progress Updates:    Session 1: Pt seen for ADL retraining with focus on functional transfers, activity tolerance, carryover of precautions, and standing balance. Pt ambulated bed>shower wearing KI and adhering to precautions. Pt required steadying assist for transfer into bathroom and close supervision when transferring out of shower. Completed bathing at supervision level and pt very conscious of precautions as she was consistently asking therapist if her "leg look ok." Placed small stool under RLE for support and positioning and pt reporting increase in comfort. Discussed setup of items in bathroom and shower at home to ensure adherence to precautions. Pt utilized AE for LB dressing with increased time and required min assist for donning and tying shoes. Therapist donned abductor brace with pt providing verbal instructions. Pt left sitting in w/c with all items in reach and SLP entering room.   Session 2: Therapy session focused on family training and functional transfers. Pt received supine in bed and reporting that daughter was on her way and would like to complete some of her family training. Pt sat EOB with supervision using leg lifter while KI was already donned. Daughter, Barnetta Chapel, entered and agreeable to practice donning/doffing orthosis. Provided visual demo and verbal instruction for donning KI then [place] returned demonstration. Practiced donning/doffing hip abductor brace using same learning technique and Barnetta Chapel returned demonstration with increased time. Patient providing appropriate cues and instruction during don/doff of both braces. Discussed wearing schedule of  braces and precautions with Barnetta Chapel. Pt practiced tub transfer using leg lifter and TTB at supervision level. Pt then propelled self in w/c back to room with 1 rest break for activity tolerance and BUE strengthening. Pt and daughter with no questions or concerns about discharge. Plan to complete family training/education with husband tomorrow.   Therapy Documentation Precautions:  Precautions Precautions: Fall;Posterior Hip;Other (comment) Precaution Booklet Issued: No Precaution Comments: Pt able to independently verbalize all posterior hip precautions. Required Braces or Orthoses: Knee Immobilizer - Right Knee Immobilizer - Right: On at all times;Other (comment) Restrictions Weight Bearing Restrictions: Yes RLE Weight Bearing: Weight bearing as tolerated Other Position/Activity Restrictions: per Algis Liming, PA General: General Amount of Missed OT Time (min): 5 Minutes Vital Signs: Therapy Vitals Temp: 98.4 F (36.9 C) Temp src: Oral Pulse Rate: 72 Resp: 16 BP: 124/64 mmHg Patient Position, if appropriate: Lying Oxygen Therapy SpO2: 99 % O2 Device: None (Room air) Pain:  No report of pain during therapy sessions.   See FIM for current functional status  Therapy/Group: Individual Therapy  Duayne Cal 03/28/2013, 8:53 AM

## 2013-03-28 NOTE — Progress Notes (Signed)
Speech Language Pathology Daily Session Note  Patient Details  Name: Kayla [name] MRN: [id number] Date of Birth: 1951/03/04  Today's Date: 03/28/2013 Time: 0830-0900 Time Calculation (min): 30 min  Short Term Goals: Week 2: SLP Short Term Goal 1 (Week 2): Pt will utilize safe swallow strategies with regular textures and thin liquids with supervision cues to reduce overt s/s of aspiration SLP Short Term Goal 2 (Week 2): Pt will demonstrate adequate diaphragmatic breathing for increased breath support at the phrase level with Min cues  Skilled Therapeutic Interventions: Skilled treatment focused on swallowing and voice goals. SLP facilitated session with review of diaphragmatic breathing exercises. Pt demonstrated increased understanding of task, using her hand and a mirror for self-monitoring. Pt demonstrated increased diaphragmatic breathing with Min cues during sustained phonation task, and with Mod cues while practicing functional phrases. Continue plan of care.   FIM:  Comprehension Comprehension Mode: Auditory Comprehension: 5-Understands complex 90% of the time/Cues < 10% of the time Expression Expression Mode: Verbal Expression: 6-Expresses complex ideas: With extra time/assistive device Social Interaction Social Interaction: 6-Interacts appropriately with others with medication or extra time (anti-anxiety, antidepressant). Problem Solving Problem Solving: 5-Solves basic 90% of the time/requires cueing < 10% of the time Memory Memory: 6-More than reasonable amt of time FIM - Eating Eating Activity: 6: More than reasonable amount of time  Pain Pain Assessment Pain Assessment: No/denies pain Pain Score: 3   Therapy/Group: Individual Therapy   Germain Osgood, M.A. CCC-SLP [phone]   Germain Osgood 03/28/2013, 11:28 AM

## 2013-03-28 NOTE — Plan of Care (Signed)
Problem: RH Furniture Transfers Goal: LTG Patient will perform furniture transfers w/assist (OT/PT LTG: Patient will perform furniture transfers with assistance (OT/PT).  Outcome: Not Applicable Date Met:  22/02/54 Furniture transfers not functional or safe for pt at this time, as transferring to/from low surfaces place pt at risk for R hip dislocation.  [facility] 03/28/13

## 2013-03-28 NOTE — Progress Notes (Addendum)
Physical Therapy Session Note  Patient Details  Name: Kayla [name] MRN: [id number] Date of Birth: 04/17/1950  Today's Date: 03/28/2013 Time: 6962-9528 Time Calculation (min): 60 min  Skilled Therapeutic Interventions/Progress Updates:    Pt received seated in w/c with R hip abduction orthosis on; agreeable to PT. Session focused on initiation of car transfers, improving endurance, and increasing stability/independence with gait and transfers. Multiple sit<>stands from w/c<>rollator with supervision. Performed w/c propulsion x200' (to increase pt endurance) in controlled environment with bilat UE's and supervision, increased time. Stand-pivot from w/c<>car (elevated seat height) with rollator and min A for RLE management. Mod verbal cues for car seat position to facilitate pt adherence to R hip precautions; pt with effective within-session carryover, good safety awareness during car transfer. Gait x102' in controlled environment with rollator, supervision/cueing for RLE positioning (requiring during <10% of gait trial). Once returned to pt room, performed stand-pivot transfer to elevated toilet with min A, from elevated toilet with supervision using rollator. Sit>supine with supervision using leg lifter. Therapist departed with pt semi-reclined in bed with R KI and bilat Prevalon boots on and all need within reach.   Addendum: Long term goal addressing furniture transfers discharged, as transferring to/from low surfaces is not safe at this time secondary to risk of R hip dislocation. Community ambulation goal also discharged, as pt plans to utilize w/c for community mobility upon discharge. Added long term goal addressing w/c mobility in community environment.  Therapy Documentation Precautions:  Precautions Precautions: Fall;Posterior Hip;Other (comment) Precaution Booklet Issued: No Precaution Comments: Pt able to independently verbalize all posterior hip precautions. Required Braces or  Orthoses: Knee Immobilizer - Right Knee Immobilizer - Right: On at all times;Other (comment) Restrictions Weight Bearing Restrictions: Yes RLE Weight Bearing: Non weight bearing Other Position/Activity Restrictions: per Algis Liming, PA Vital Signs: Oxygen Therapy SpO2: 99 % O2 Device: None (Room air) Pain: Pain Assessment Pain Assessment: No/denies pain Pain Score: 0-No pain Locomotion : Ambulation Ambulation/Gait Assistance: 5: Supervision Wheelchair Mobility Distance: 200   See FIM for current functional status  Therapy/Group: Individual Therapy  Hobble, Malva Cogan 03/28/2013, 11:52 AM

## 2013-03-28 NOTE — Plan of Care (Signed)
Problem: RH BLADDER ELIMINATION Goal: RH STG MANAGE BLADDER WITH ASSISTANCE STG Manage Bladder With min Assistance  Outcome: Not Progressing Patient with urinary retention. I/O cath q 6 hrs. adm

## 2013-03-28 NOTE — Plan of Care (Signed)
Problem: RH Ambulation Goal: LTG Patient will ambulate in community environment (PT) LTG: Patient will ambulate in community environment, # of feet with assistance (PT).  Outcome: Not Applicable Date Met:  09/64/38 Community ambulation not functional at this time, as pt plans to utilize w/c for community mobility.  [facility] 03/28/13

## 2013-03-28 NOTE — Plan of Care (Signed)
Problem: RH PAIN MANAGEMENT Goal: RH STG PAIN MANAGED AT OR BELOW PT'S PAIN GOAL 3 or less on scale 0-10  Outcome: Progressing Pain managed with Tylenol 650mg  q 4hrs

## 2013-03-29 ENCOUNTER — Inpatient Hospital Stay (HOSPITAL_COMMUNITY): Payer: 59 | Admitting: Physical Therapy

## 2013-03-29 ENCOUNTER — Inpatient Hospital Stay (HOSPITAL_COMMUNITY): Payer: 59

## 2013-03-29 DIAGNOSIS — W19XXXA Unspecified fall, initial encounter: Secondary | ICD-10-CM

## 2013-03-29 DIAGNOSIS — G35 Multiple sclerosis: Secondary | ICD-10-CM

## 2013-03-29 DIAGNOSIS — T84029A Dislocation of unspecified internal joint prosthesis, initial encounter: Secondary | ICD-10-CM

## 2013-03-29 DIAGNOSIS — S72009A Fracture of unspecified part of neck of unspecified femur, initial encounter for closed fracture: Secondary | ICD-10-CM

## 2013-03-29 LAB — URINE CULTURE
Colony Count: NO GROWTH
Culture: NO GROWTH

## 2013-03-29 NOTE — Progress Notes (Signed)
Physical Therapy Note  Patient Details  Name: Kayla [name] MRN: [id number] Date of Birth: 1950-11-09 Today's Date: 03/29/2013  1115-1200 (45 minutes) individual Pain: no complaint of pain Precautions : RT posterior THA precautions; abduction brace when up ; knee immobilizer + PRAFO in bed  Focus of treatment: gait training/ activity tolerance; therapeutic exercise focused on bilateral LE strengthening/ AROM observing posterior RT LE THA precautions Treatment: Pt up in wc ; pt propelled wc 120 feet modified independent on unit level surfaces X 1 without rest break; transfers stand/turn Rollator SBA ; sit >< supine using leg lifter RT LE SBA ; therapeutic exercise bilateral LEs X 20 - ankle pumps, heel slides, hip abduction (left only); SAQS , glut and quad sets; gait 120 feet rollator SBA ; returned to bed with above precautions in place.   Bettyann Birchler,JIM 03/29/2013, 11:57 AM

## 2013-03-29 NOTE — Progress Notes (Signed)
Occupational Therapy Session Note  Patient Details  Name: Kayla [name] MRN: [id number] Date of Birth: 11-27-1950  Today's Date: 03/29/2013 Time: 0830-0927 Time Calculation (min): 57 min  Short Term Goals: Week 2:  OT Short Term Goal 1 (Week 2): Focus on LTGs  Skilled Therapeutic Interventions/Progress Updates:    Pt seen for ADL retraining with focus on activity tolerance, standing balance, and functional transfers. Pt received supine in bed requesting to complete bathing at sink this AM. Pt wearing KI and requesting to go to toilet. Ambulated with rollator to toilet at supervision level and completed toilet task at supervision level while adhering to precautions. Completed bathing at sink with pt requesting for AE to prevent from breaking precautions. Pt utilizing AE (LH sponge, reacher, sock-aid) efficiently and effectively. Provided theraband for HEP and informed pt therapist would print up HEP for pt. At end of session pt left sitting in w/c with all needs in reach and hip abductor brace donned.  Therapy Documentation Precautions:  Precautions Precautions: Fall;Posterior Hip;Other (comment) Precaution Booklet Issued: No Precaution Comments: Pt able to independently verbalize all posterior hip precautions. Required Braces or Orthoses: Knee Immobilizer - Right Knee Immobilizer - Right: On at all times;Other (comment) Restrictions Weight Bearing Restrictions: Yes RLE Weight Bearing: Non weight bearing Other Position/Activity Restrictions: per Algis Liming, PA General:   Vital Signs: Therapy Vitals Temp: 98.1 F (36.7 C) Temp src: Oral Pulse Rate: 70 Resp: 16 BP: 120/70 mmHg Patient Position, if appropriate: Lying Oxygen Therapy SpO2: 99 % O2 Device: None (Room air) Pain: Pt reporting 1/10 pain at R hip  See FIM for current functional status  Therapy/Group: Individual Therapy  Douglas Rooks N 03/29/2013, 9:30 AM

## 2013-03-29 NOTE — Progress Notes (Signed)
Social Work Patient ID: Kayla [name], female   DOB: [birth year], 63 y.o.   MRN: [id number] Met with pt,her family and therapy team to discuss discharge date and reaching her goals sooner than Sat.  Also informed them insurance  Questioning discharge date and meeting her goals.  All in agreement more family education tomorrow and pt being ready for discharge Thurs. Husband, pt and sister in-law feel this is manageable.  Will work toward discharge [employer] to contact husband regarding delivery time.

## 2013-03-29 NOTE — Plan of Care (Signed)
Problem: RH BLADDER ELIMINATION Goal: RH STG MANAGE BLADDER WITH EQUIPMENT WITH ASSISTANCE STG Manage Bladder With Equipment With Assistance. Total  Outcome: Progressing Continue to require I&O cath

## 2013-03-29 NOTE — Plan of Care (Signed)
Problem: RH BLADDER ELIMINATION Goal: RH STG MANAGE BLADDER WITH ASSISTANCE STG Manage Bladder With Assistance. Total A  Outcome: Not Progressing Requires in and out caths by staff.On urecholine but not voiding.wbb

## 2013-03-29 NOTE — Progress Notes (Signed)
Speech Language Pathology Daily Session Note  Patient Details  Name: Kayla [name] MRN: [id number] Date of Birth: 1950/04/23  Today's Date: 03/29/2013 Time: 1000-1030 Time Calculation (min): 30 min  Short Term Goals: Week 2: SLP Short Term Goal 1 (Week 2): Pt will utilize safe swallow strategies with regular textures and thin liquids with supervision cues to reduce overt s/s of aspiration SLP Short Term Goal 2 (Week 2): Pt will demonstrate adequate diaphragmatic breathing for increased breath support at the phrase level with Min cues  Skilled Therapeutic Interventions: Skilled treatment focused on swallowing and voice goals. SLP facilitated session with review of diaphragmatic breathing exercises. Pt demonstrated increased understanding of task, using her hand and a mirror for self-monitoring prior to fading out the mirror/visual cues. Pt demonstrated increased diaphragmatic breathing with Min cues during sustained phonation task and while practicing functional phrases. Pt consumed thin liquids via cup sips throughout session with cough x1 observed; pt remains afebrile. Continue plan of care.   FIM:  Comprehension Comprehension Mode: Auditory Comprehension: 5-Understands complex 90% of the time/Cues < 10% of the time Expression Expression Mode: Verbal Expression: 6-Expresses complex ideas: With extra time/assistive device Social Interaction Social Interaction: 6-Interacts appropriately with others with medication or extra time (anti-anxiety, antidepressant). Problem Solving Problem Solving: 5-Solves basic problems: With no assist Memory Memory: 6-More than reasonable amt of time FIM - Eating Eating Activity: 6: Swallowing techniques: self-managed  Pain Pain Assessment Pain Assessment: No/denies pain Pain Score: 1  Pain Type: Acute pain Pain Location: Head Pain Descriptors / Indicators: Aching Pain Frequency: Intermittent Pain Onset: Gradual Pain Intervention(s): Medication  (See eMAR)  Therapy/Group: Individual Therapy   Germain Osgood, M.A. CCC-SLP [phone]   Germain Osgood 03/29/2013, 1:29 PM

## 2013-03-29 NOTE — Progress Notes (Signed)
Subjective/Complaints: Slept well  still req I/O caths.Has had to go home with foley and f/u with Dr Amalia Hailey from urology after a prior hospitalization          A 12 point review of systems has been performed and if not noted above is otherwise negative.   Objective: Vital Signs: Blood pressure 120/70, pulse 70, temperature 98.1 F (36.7 C), temperature source Oral, resp. rate 16, height 5\' 3"  (1.6 m), weight 65.5 kg (144 lb 6.4 oz), SpO2 99.00%.   Recent Labs  03/28/13 0930  WBC 4.9  HGB 9.4*  HCT 27.9*  PLT 351    Recent Labs  03/28/13 0930  NA 138  K 3.7  CL 100  GLUCOSE 98  BUN 10  CREATININE 0.53  CALCIUM 9.4   CBG (last 3)   Recent Labs  03/28/13 0718  GLUCAP 85    Wt Readings from Last 3 Encounters:  03/15/13 65.5 kg (144 lb 6.4 oz)  03/15/13 65.5 kg (144 lb 6.4 oz)  03/13/13 63.2 kg (139 lb 5.3 oz)    Physical Exam:  Constitutional: She is oriented to person, place, and time. She appears well-developed and well-nourished.  HENT:  Head: Normocephalic and atraumatic.  Eyes: Pupils are equal, round, Neck: Neck supple.  Cardiovascular: Normal rate and regular rhythm.  Respiratory: Effort normal. She has normal breath sounds.  GI: Soft. Bowel sounds are normal. She exhibits no distension. There is no tenderness. There is no rebound and no guarding.  Musculoskeletal:  Leg length equal no int rotation deformity on Right side. Neurological: She is alert and oriented to person, place, and time. Coordination abnormal.  Upper ext 5/5 bilaterally. Left lower 3+ HF to 4- KE, to 4 ankle. RLE is 1+ HF, 2 KE, 3/5 ADF, 3+ APF. No sensory loss to LT  Skin: Skin is warm and dry.  Psychiatric: she is anxious   Assessment/Plan: 1. Functional deficits secondary to right FNF s/p hip hemi and subsequent dislocation. Pt also has MS. Her conditions require 3+ hours per day of interdisciplinary therapy in a comprehensive inpatient rehab setting. Physiatrist is providing  close team supervision and 24 hour management of active medical problems listed below. Physiatrist and rehab team continue to assess barriers to discharge/monitor patient progress toward functional and medical goals.   FIM: FIM - Bathing Bathing Steps Patient Completed: Chest;Right Arm;Left Arm;Abdomen;Front perineal area;Buttocks;Right upper leg;Left upper leg;Right lower leg (including foot);Left lower leg (including foot) Bathing: 5: Supervision: Safety issues/verbal cues (verbal cues)  FIM - Upper Body Dressing/Undressing Upper body dressing/undressing steps patient completed: Thread/unthread right bra strap;Thread/unthread left bra strap;Hook/unhook bra;Thread/unthread right sleeve of pullover shirt/dresss;Thread/unthread left sleeve of pullover shirt/dress Upper body dressing/undressing: 5: Set-up assist to: Obtain clothing/put away FIM - Lower Body Dressing/Undressing Lower body dressing/undressing steps patient completed: Pull underwear up/down;Thread/unthread right pants leg;Thread/unthread left pants leg;Pull pants up/down;Thread/unthread right underwear leg;Thread/unthread left underwear leg Lower body dressing/undressing: 4: Min-Patient completed 75 plus % of tasks  FIM - Toileting Toileting steps completed by patient: Adjust clothing prior to toileting;Performs perineal hygiene;Adjust clothing after toileting Toileting Assistive Devices: Grab bar or rail for support Toileting: 5: Supervision: Safety issues/verbal cues  FIM - Radio producer Devices: Elevated toilet seat (rollator) Toilet Transfers: 5-From toilet/BSC: Supervision (verbal cues/safety issues);4-To toilet/BSC: Min A (steadying Pt. > 75%)  FIM - Bed/Chair Transfer Bed/Chair Transfer Assistive Devices: Walker;Orthosis (Leg lifter; R KI) Bed/Chair Transfer: 5: Sit > Supine: Supervision (verbal cues/safety issues);5: Chair or W/C > Bed: Supervision (  verbal cues/safety issues)  FIM -  Locomotion: Wheelchair Distance: 200 Locomotion: Wheelchair: 5: Travels 150 ft or more: maneuvers on rugs and over door sills with supervision, cueing or coaxing FIM - Locomotion: Ambulation Locomotion: Ambulation Assistive Devices: Other (comment);Orthosis (R hip abduction orthosis; rollator) Ambulation/Gait Assistance: 5: Supervision Locomotion: Ambulation: 2: Travels 50 - 149 ft with supervision/safety issues  Comprehension Comprehension Mode: Auditory Comprehension: 5-Understands complex 90% of the time/Cues < 10% of the time  Expression Expression Mode: Verbal Expression: 6-Expresses complex ideas: With extra time/assistive device  Social Interaction Social Interaction: 6-Interacts appropriately with others with medication or extra time (anti-anxiety, antidepressant).  Problem Solving Problem Solving: 5-Solves basic problems: With no assist  Memory Memory: 6-More than reasonable amt of time  Medical Problem List and Plan:  1. DVT Prophylaxis/Anticoagulation: Pharmaceutical: Lovenox  2. Pain Management: Prn tylenol and/or baclofen.  -tylenol -will use the hydrocodone at night to help with sleep and pain -low dose hydrocodone prn if needed. 3. Mood: Provide ego support for high levels of anxiety. Educate on hip precautions.   -anxiety not helping with hip pain 4. Neuropsych: This patient is capable of making decisions on her own behalf.  5. MS with neurogenic bladder: Recheck UA/UCS negative   -IC cath prn. Added urecholine Very small infreq voids -needs timed voiding schedule -augment bowel meds/program--- 6. Hypoxia: Aspiration PNA has been treated.  -Encourage IS with flutter valve.  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   8. Leukocytosis: resolved 9. ABLA: resolved  LOS (Days) 14 A FACE TO FACE EVALUATION WAS PERFORMED  Charlett Blake 03/29/2013 8:17 AM

## 2013-03-29 NOTE — Progress Notes (Signed)
Social Work Patient ID: Kayla [name], female   DOB: 1951/01/06, 63 y.o.   MRN: [id number] Discussed DME with pt and therapies.  Recommend at hospital bed, wheelchair, rollator walker and bsc. Pt requires a lightweight wheelchair to be able to self propel and uses for self Care.  She is unable to self propel a standard weight wheelchair.  She also requires a hospital bed for positioning and elevation.  She has MS along with her hip fracture and can not Do this with a wedge.  She requires a hospital bed for this and also pain control.  Family will come in closer to discharge Sat.

## 2013-03-29 NOTE — Plan of Care (Signed)
Problem: RH SKIN INTEGRITY Goal: RH STG ABLE TO PERFORM INCISION/WOUND CARE W/ASSISTANCE STG Able To Perform Incision/Wound Care With max Assistance.  Outcome: Not Applicable Date Met:  94/71/25 Incision healed

## 2013-03-29 NOTE — Progress Notes (Signed)
Physical Therapy Session Note  Patient Details  Name: Kayla [name] MRN: [id number] Date of Birth: 11/04/1950  Today's Date: 03/29/2013 Time: 7062-3762 Time Calculation (min): 65 min  Skilled Therapeutic Interventions/Progress Updates:    Pt received semi-reclined in bed with KI on RLE. Pt's husband, sister-in-law, and both daughters present for hands-on family education, which was the focus of today's session. Therapist verbally explained and demonstrated the following to pt's family: R hip precautions, R knee immobilizer, R hip abduction orthosis as well as safe/proper use of rollator, standard wheelchair, hospital bed, and leg lifter. Family members engaged in conversation, asking questions about DME.  Provided pt's husband with verbal explanation of proper method for donning/doffing R hip abduction orthosis with pt standing with bilat UE support at stable surface. Mod verbal cueing required concurrently with husband and sister-in-law donning brace with pt standing EOB with bilat UE support at locked rollator. Therapist demonstrated technique for breaking down rollator and w/c; husband gave effective return demonstration. Family educated on how to obtain leg lifter. Verbally explained, demonstrated supervision/cueing required for gait and basic transfers; husband provided supervision, appropriate cueing during bed<>w/c and gait x55' in controlled environment with rollator. Therapist departed with pt semi-reclined in bed with KI on RLE, Prevalon boots on bilat LE's, all abovementioned family members present, 3 bed rails up, bed alarm on, and all pt needs within reach.  Remaining family training sessions to focus on stair negotiation and car transfers.  Therapy Documentation Precautions:  Precautions Precautions: Fall;Posterior Hip;Other (comment) Precaution Booklet Issued: No Precaution Comments: Pt able to independently verbalize all posterior hip precautions. Required Braces or Orthoses:  Knee Immobilizer - Right Knee Immobilizer - Right: On at all times;Other (comment) Restrictions Weight Bearing Restrictions: Yes RLE Weight Bearing: Non weight bearing Other Position/Activity Restrictions: per Algis Liming, PA Vital Signs: Therapy Vitals Temp: 98.5 F (36.9 C) Temp src: Oral Pulse Rate: 76 Resp: 17 BP: 115/74 mmHg Patient Position, if appropriate: Lying Oxygen Therapy SpO2: 96 % O2 Device: None (Room air) Pain: Pain Assessment Pain Assessment: No/denies pain Pain Score: 0-No pain Locomotion : Ambulation Ambulation/Gait Assistance: 5: Supervision   See FIM for current functional status  Therapy/Group: Individual Therapy  Virgene Tirone, Malva Cogan 03/29/2013, 5:05 PM

## 2013-03-30 ENCOUNTER — Inpatient Hospital Stay (HOSPITAL_COMMUNITY): Payer: 59

## 2013-03-30 ENCOUNTER — Inpatient Hospital Stay (HOSPITAL_COMMUNITY): Payer: 59 | Admitting: Physical Therapy

## 2013-03-30 MED ORDER — BACLOFEN 20 MG PO TABS: 20.0000 mg | ORAL_TABLET | Freq: Four times a day (QID) | ORAL | Status: DC

## 2013-03-30 MED ORDER — HYDROCODONE-ACETAMINOPHEN 5-325 MG PO TABS: 1.0000 | ORAL_TABLET | Freq: Every day | ORAL | Status: DC

## 2013-03-30 MED ORDER — SENNOSIDES-DOCUSATE SODIUM 8.6-50 MG PO TABS: 2.0000 | ORAL_TABLET | Freq: Two times a day (BID) | ORAL | Status: DC

## 2013-03-30 MED ORDER — TEMAZEPAM 7.5 MG PO CAPS
15.0000 mg | ORAL_CAPSULE | Freq: Every day | ORAL | Status: DC
  Administered 2013-03-30: 15 mg via ORAL
  Filled 2013-03-30: qty 2

## 2013-03-30 MED ORDER — ASPIRIN 325 MG PO TBEC: 325.0000 mg | DELAYED_RELEASE_TABLET | Freq: Every day | ORAL | Status: DC

## 2013-03-30 MED ORDER — FERROUS SULFATE 325 (65 FE) MG PO TABS: 325.0000 mg | ORAL_TABLET | Freq: Three times a day (TID) | ORAL | Status: DC

## 2013-03-30 NOTE — Progress Notes (Signed)
Social Work Patient ID: Kayla [name], female   DOB: [birth year], 63 y.o.   MRN: [id number] Met with pt to inform of team conference being ready tomorrow and meeting her goals.  She is pleased with the plan and DME to be delivered today. Prepare for discharge tomorrow.  Finish up family education today.  Home health arranged.

## 2013-03-30 NOTE — Patient Care Conference (Signed)
Inpatient RehabilitationTeam Conference and Plan of Care Update Date: 03/30/2013   Time: 10;30 AM    Patient Name: Kayla [name]      Medical Record Number: [id number]  Date of Birth: [birth year] Sex: Female         Room/Bed: 4W05C/4W05C-01 Payor Info: Payor: Theme park manager / Plan: Theme park manager / Product Type: *No Product type* /    Admitting Diagnosis: HIP FX dislocated right total hip  Admit Date/Time:  03/15/2013  5:38 PM Admission Comments: No comment available   Primary Diagnosis:  S/P hip hemiarthroplasty Principal Problem: S/P hip hemiarthroplasty  Patient Active Problem List   Diagnosis Date Noted  . S/P hip hemiarthroplasty 03/15/2013  . Hypoxia 03/14/2013  . Dislocation of hip prosthesis 03/12/2013  . Hip dislocation, left 03/12/2013  . Hip dislocation, right 03/12/2013  . Acute blood loss anemia 03/11/2013  . Urinary retention with incomplete bladder emptying 03/09/2013  . Leucocytosis 03/09/2013  . Dysphagia, unspecified(787.20) 03/09/2013  . Femoral neck fracture 03/08/2013  . CAP (community acquired pneumonia) 02/26/2013  . Unspecified constipation 02/26/2013  . HCAP (healthcare-associated pneumonia) 02/26/2013  . Acute diastolic heart failure 13/24/4010  . Vocal cord paralysis 02/26/2013  . Acute respiratory failure with hypoxia 02/24/2013  . Pain 02/24/2013  . Fractured femoral neck 02/23/2013  . Hip fracture 02/23/2013  . Excessive flatus 10/21/2012  . Cold feet 07/29/2012  . Hyponatremia 05/20/2012  . Calf pain 11/17/2011  . Back pain 11/27/2010  . DYSPNEA 12/06/2007  . OSTEOPENIA 09/17/2007  . HYPOTHYROIDISM 04/01/2007  . Multiple sclerosis 04/01/2007  . Other diseases of vocal cords 04/01/2007    Expected Discharge Date: Expected Discharge Date: 03/31/13  Team Members Present: Physician leading conference: Dr. Alysia Penna Social Worker Present: Ovidio Kin, LCSW;Jenny Prevatt, LCSW Nurse Present: Dorien Chihuahua, RN PT Present:  Other (comment) Jilda Roche) OT Present: Gareth Morgan, Lorelee Cover, OT SLP Present: Germain Osgood, SLP PPS Coordinator present : Daiva Nakayama, RN, CRRN     Current Status/Progress Goal Weekly Team Focus  Medical   no further hip dislocation, pain controlled  avoid further dislocation  place foley   Bowel/Bladder   Continent of bowel. LBM 03/28/13. Requiring I&O cath q 6hrs due to urinary retention  Managed bladder program  Initiate family education on I&O cath   Swallow/Nutrition/ Hydration   Mod I with regular textures and thin liquids  Mod I  goal met, focus on education   ADL's   supervision with increased time for self-care tasks; pt using AE effectively and effciently, continuing to practice pt verbalizing instructions for donning braces  supervision overall   family education/training, carryover of precautions, activity toelrance, strengthening   Mobility   Supervision with bed mobility, w/c mobility, tranfers, and gait x120'. Mod A with stair negotiation.  Mod I with bed mobility, w/c mobility, transfers. Supervision with gait x150'. Min A with stair negotiation.  Hands-on family education/training, stair negotiation, endurance, Animator   Mod I with diaphragmatic breathing exercises  Mod I  goal met, focus on education   Safety/Cognition/ Behavioral Observations    no safety concerns        Pain   Pain controlled with Tylenon 672m q 4hrs, prn  <3  Offer prior to initial therapy session   Skin   R hip incision with dermabond, healed  No additional skin breakdown  Encourage turn q 2hrs      *See Care Plan and progress notes for long and short-term goals.  Barriers to  Discharge: urinary retention    Possible Resolutions to Barriers:  place foley complete fam ed    Discharge Planning/Teaching Needs:  Home with sister in-law and husband assisting-along with daughter's when they can.  Family education completed and meeting goals  sooner than Sat. Moving up dc to Thurs all in agreement with      Team Discussion:  Foley for home and follow up with Urol  Family education being completed today.  Pt will have 24 hr care.  Pain better. Ready for discharge tomorrow  Revisions to Treatment Plan:  Met goals sooner moving up discharge date   Continued Need for Acute Rehabilitation Level of Care: The patient requires daily medical management by a physician with specialized training in physical medicine and rehabilitation for the following conditions: Daily direction of a multidisciplinary physical rehabilitation program to ensure safe treatment while eliciting the highest outcome that is of practical value to the patient.: Yes Daily medical management of patient stability for increased activity during participation in an intensive rehabilitation regime.: Yes Daily analysis of laboratory values and/or radiology reports with any subsequent need for medication adjustment of medical intervention for : Neurological problems;Other  Kayla [name] 03/30/2013, 12:41 PM

## 2013-03-30 NOTE — Discharge Instructions (Signed)
Inpatient Rehab Discharge Instructions  Kayla [name] Discharge date and time:    Activities/Precautions/ Functional Status: Activity: activity as tolerated with total hip precautions. Wear hip brace when out of bed.  Diet: regular diet Wound Care: keep wound clean and dry  Functional status:  ___ No restrictions     ___ Walk up steps independently ___ 24/7 supervision/assistance   ___ Walk up steps with assistance ___ Intermittent supervision/assistance  ___ Bathe/dress independently _X__ Walk with walker    ___ Bathe/dress with assistance ___ Walk Independently    ___ Shower independently _X__ Walk with assistance    ___ Shower with assistance ___ No alcohol     ___ Return to work/school ________  Special Instructions: 1. Do not use Toviaz. 2. Continue right total hip precautions.    COMMUNITY REFERRALS UPON DISCHARGE:    Home Health:   PT, RN     Agency: [facility] DTOIZ:[phone] Date of last service:03/31/2013   Medical Equipment/Items Ordered:HOSPITAL BED, [facility], [place], [place]    [phone]   GENERAL COMMUNITY RESOURCES FOR PATIENT/FAMILY: Support Groups:[facility] SUPPORT GROUP  My questions have been answered and I understand these instructions. I will adhere to these goals and the provided educational materials after my discharge from the hospital.  Patient/Caregiver Signature _______________________________ Date __________  Clinician Signature _______________________________________ Date __________  Please bring this form and your medication list with you to all your follow-up doctor's appointments.

## 2013-03-30 NOTE — Progress Notes (Signed)
Speech Language Pathology Discharge Summary & Final Treatment Note  Patient Details  Name: Kayla [name] MRN: [id number] Date of Birth: February 11, 1951  Today's Date: 03/30/2013 Time: 0930-1000 Time Calculation (min): 30 min  Skilled Therapeutic Intervention: Treatment focused on pt education, with discharge now advanced to 1/8. SLP facilitated session with education regarding current level of functioning, including baseline risk of aspiration. Pt recalled her swallowing precautions with Mod I, and demonstrated her use of them with Mod I, consuming cup sips of thin liquid with no overt s/s of aspiration. Pt self-monitored for utilization of diaphragmatic breathing during conversation with Mod I. Education completed with pt verbalizing and demonstrating her understanding of the information provided.   Patient has met 2 of 2 long term goals.  Patient to discharge at overall Modified Independent level.  Reasons goals not met: N/A   Clinical Impression/Discharge Summary: Pt has met 2 out of 2 LTGs this admission, demonstrating functional gains with pharyngeal swallowing and diaphragmatic breathing. She is consuming regular textures and thin liquids, utilizing her compensatory strategies with Mod I. She is Mod I with her diaphragmatic breathing exercise to maximize breath support for voice and dysphagia. Education has been completed with the patient, who is scheduled to discharge home tomorrow with 24/7 family support. Pt is functioning at a Mod I level, and no further SLP services are recommended at this time.  Care Partner:  Caregiver Able to Provide Assistance:  (N/A)  Type of Caregiver Assistance:  (N/A)  Recommendation:  None      Equipment:  None recommended by SLP  Reasons for discharge: Treatment goals met;Discharged from hospital   Patient/Family Agrees with Progress Made and Goals Achieved: Yes   See FIM for current functional status   Germain Osgood, M.A.  CCC-SLP [phone]   Germain Osgood 03/30/2013, 10:49 AM

## 2013-03-30 NOTE — Discharge Summary (Signed)
Physician Discharge Summary  Patient ID: [name] MRN: [id number] DOB/AGE: 05-16-1950 63 y.o.  Admit date: 03/15/2013 Discharge date: 03/31/2013  Discharge Diagnoses:  Principal Problem:   S/P hip hemiarthroplasty Active Problems:   DYSPNEA   Unspecified constipation   Urinary retention with incomplete bladder emptying   Hip dislocation, right   Hypoxia   Discharged Condition: Improved   Significant Diagnostic Studies:  Dg Hip 1 View Right  03/19/2013   CLINICAL DATA:  Right hip pain. Status post right hip replacement 02/23/2013.  EXAM: RIGHT HIP - 1 VIEW  COMPARISON:  Plain film of the right hip 03/12/2013.  FINDINGS: [name] right total hip arthroplasty is posteriorly dislocated. No fracture is identified. Soft tissue calcifications in the buttocks are noted.  IMPRESSION: Posterior dislocation of the patient's right hip hemiarthroplasty.  These results will be called to the ordering clinician or representative by the Radiologist Assistant, and communication documented in the PACS Dashboard.   Electronically Signed   By: Inge Rise M.D.   On: 03/19/2013 14:58   Dg Hip Operative Right  03/19/2013   CLINICAL DATA:  Closed right hip prosthesis dislocation reduction.  EXAM: DG OPERATIVE RIGHT HIP  TECHNIQUE: A single spot fluoroscopic AP image of the right hip is submitted.  COMPARISON:  03/19/2013 at 1133 hr  FINDINGS: For portable views show reduction of the right hip prosthesis into normal alignment with the acetabulum. There is no acute fracture or evidence of loosening of the prosthesis.  IMPRESSION: Successful reduction of the right hip prosthesis dislocation.   Electronically Signed   By: Lajean Manes M.D.   On: 03/19/2013 20:32    Labs:  Basic Metabolic Panel:  Recent Labs Lab 03/28/13 0930  NA 138  K 3.7  CL 100  CO2 26  GLUCOSE 98  BUN 10  CREATININE 0.53  CALCIUM 9.4    CBC:  Recent Labs Lab 03/28/13 0930  WBC 4.9  HGB 9.4*  HCT 27.9*   MCV 87.5  PLT 351    CBG:  Recent Labs Lab 03/28/13 0718  GLUCAP 85    Brief HPI:   Kayla [name] is a 63 y.o. 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 Surgical intervention recommended by Dr. Alvan Dame but on 02/24/13 she developed dyspnea and hypoxia with intermittent stridor and was transferred to ICU.  She developed fever on 12/05 due to pseudomonas UTI as well as suspicion of aspiration PNA and was treated broad antibiotic coverage.  She was treated with IV solumedrol X three days per Dr. Glennon Hamilton input due to concerns of worsening of MS symptoms. . Has completed 10 day course of Levaquin today--procalcitonin levels <0.10.   Once stabilized, she underwent right hip hemi by Dr. Alvan Dame on 03/01/13. MBS done revealed moderate pharyngeal and cervical esophageal dysphagia and patient placed on D2, thin liquids. She has had issues with pain and lethargy but is showing some improvement in participation with therapy. She was admitted to CIR on 03/08/13 and has continued to require oxygen due to hypoxia. She was treated with 2 units PRBC for symptomatic anemia with stabilization in BP and improvement in endurance levels. She has had urinary retention requiring in and out caths on qid basis. On 03/12/13 pm, patient dislocated her hip while on commode and was taken  to OR for Closed reduction by Dr. Maureen Ralphs. Foley was replaced due to ongoing retention.. Patient admitted back to CIR on 03/15/13 to complete her rehab program.    Hospital Course: [name] was admitted to rehab 03/15/2013 for inpatient therapies to consist of PT, ST and OT at least three hours five days a week. Past admission physiatrist, therapy team and rehab RN have worked together to provide  customized collaborative inpatient rehab. She was maintained on SQ lovenox for DVT prophylaxis. She is intolerant of narcotics and pain was managed with use of tylenol. She has had insomnia and has used one hydrocodone to help with symptoms. Patient did report increase in pain levels on 03/19/13 and was found to have dislocated her hip again. She was taken to OR for reduction by Dr. Tonita Cong that pm and hip abductor brace was ordered to be used when out of bed.   Bowel program has been ongoing with good results. Foley was discontinue and voiding trial was resumed. Urine culture done 12/23 and repeated 03/28/13 was negative for infection. She has required in and out caths every 6-8 hours despite addition of urecholine. Patient is unable to self cath due to hip precautions and  Foley was placed prior to discharge. She is to follow with urology for further work up.  She was weaned off oxygen and her endurance has greatly improved. Her diet was advance to regular textures. Right hip incision has been monitored and has healed well without signs or symptoms of infection. She has progressed to modified independent at wheelchair level. She will continue to receive home therapies past discharge.    Rehab course: During patient's stay in rehab weekly team conferences were held to monitor patient's progress, set goals and discuss barriers to discharge. Patient has had improvement in activity tolerance, balance, postural control, as well as ability to compensate for deficits. She is has had improvement in functional use RLE as well as improved awareness. Speech therapy has worked on dysphagia treatment on going with focus on utilization of safe swallow strategies as well as diaphragmatic breathing. She is able to recall and  to use these independently by discharge. She requires supervision for bathing. She is modified independent for UB dressing and requries min assist for LB dressing tasks and donning of brace. She is  modified independent for transfers and is able to ambulate 150 feet with supervision/Rollator. Family education was done with husband and sister in law who will assist as needed past discharge.    Disposition:  Home  Diet: Regular   Special Instructions: 1. Wear immobilizer when in bed and brace out of bed.  2. No weight on right leg. 3. [facility] to provide [place].   Discharge Orders   Future Appointments Provider Department Dept Phone   04/19/2013 10:15 AM Tanda Rockers, MD [facility] [phone]   05/06/2013 12:30 PM Charlett Blake, MD [facility] [phone]   Future Orders Complete By Expires   Call MD / Call 911  As directed    Comments:     If you experience chest pain or shortness of breath, CALL 911 and be transported to the hospital emergency room.  If you develope a fever above 101 F, pus (white drainage) or increased drainage or redness at the wound, or calf pain, call your surgeon's office.   Constipation Prevention  As directed    Comments:     Drink plenty of fluids.  Prune juice  may be helpful.  You may use a stool softener, such as Colace (over the counter) 100 mg twice a day.  Use MiraLax (over the counter) for constipation as needed.   Diet - low sodium heart healthy  As directed    Increase activity slowly as tolerated  As directed        Medication List    STOP taking these medications       albuterol (5 MG/ML) 0.5% nebulizer solution  Commonly known as:  PROVENTIL     calcium carbonate 500 MG chewable tablet  Commonly known as:  TUMS - dosed in mg elemental calcium     cyanocobalamin 1000 MCG tablet     dalfampridine 10 MG Tb12  Commonly known as:  AMPYRA     DSS 100 MG Caps     enoxaparin 40 MG/0.4ML injection  Commonly known as:  LOVENOX     feeding supplement (RESOURCE BREEZE) Liqd     furosemide 40 MG tablet  Commonly known as:  LASIX     guaiFENesin-codeine 100-10 MG/5ML syrup      HYDROcodone-acetaminophen 7.5-325 MG per tablet  Commonly known as:  NORCO  Replaced by:  HYDROcodone-acetaminophen 5-325 MG per tablet     polyethylene glycol packet  Commonly known as:  MIRALAX / GLYCOLAX     potassium chloride SA 20 MEQ tablet  Commonly known as:  K-DUR,KLOR-CON     risperiDONE 0.25 MG tablet  Commonly known as:  RISPERDAL     senna 8.6 MG Tabs tablet  Commonly known as:  SENOKOT     sodium chloride 0.9 % injection      TAKE these medications       acetaminophen 325 MG tablet  Commonly known as:  TYLENOL  Take 2 tablets (650 mg total) by mouth every 6 (six) hours as needed for mild pain (or Fever >/= 101).     alum & mag hydroxide-simeth 200-200-20 MG/5ML suspension  Commonly known as:  MAALOX/MYLANTA  Take 30 mLs by mouth every 4 (four) hours as needed for indigestion or heartburn.     aspirin 325 MG EC tablet  Take 1 tablet (325 mg total) by mouth daily.     baclofen 20 MG tablet  Commonly known as:  LIORESAL  Take 1 tablet (20 mg total) by mouth 4 (four) times daily.      ferrous sulfate 325 (65 FE) MG tablet  Take 1 tablet (325 mg total) by mouth 3 (three) times daily after meals.     FLUoxetine 40 MG capsule  Commonly known as:  PROZAC  Take 40 mg by mouth daily.     gabapentin 600 MG tablet  Commonly known as:  NEURONTIN  Take 600 mg by mouth 2 (two) times daily.     HYDROcodone-acetaminophen 5-325 MG per tablet Rx # 30 pills   Commonly known as:  NORCO/VICODIN  Take 1 tablet by mouth at bedtime.     levothyroxine 75 MCG tablet  Commonly known as:  SYNTHROID  Take 1 tablet (75 mcg total) by mouth daily.     mometasone-formoterol 100-5 MCG/ACT Aero  Commonly known as:  DULERA  Inhale 2 puffs into the lungs 2 (two) times daily.     pantoprazole 40 MG tablet  Commonly known as:  PROTONIX  Take 1 tablet (40 mg total) by mouth 2 (two) times daily.     RESTORIL 15 MG capsule  Generic drug:  temazepam  Take 15 mg by mouth daily as  needed.     senna-docusate 8.6-50 MG per tablet  Commonly known as:  Senokot-S  Take 2 tablets by mouth 2 (two) times daily.           Follow-up Information   Follow up with Mauri Pole, MD On 04/06/2013. (Be there at 9:30 am for 9:45 appointment)    Specialty:  Orthopedic Surgery   Contact information:   [address] [place] [address] [place] [address] [phone]       Follow up with Charlett Blake, MD On 05/06/2013. (Be there at 12 noon for 12:30)    Specialty:  Physical Medicine and Rehabilitation   Contact information:   [address] [place] [address] [phone]       Follow up with Ardis Hughs, MD On 04/18/2013. (Appointment at 9:45 am)    Specialty:  Urology   Contact information:   [facility]  [place] [address] [phone]       Follow up with Christinia Gully, MD On 04/19/2013. (APPT @ 10:15 Am)    Specialty:  Pulmonary Disease   Contact information:   [address] [address] [phone]       Signed: [name] 03/30/2013, 7:50 PM

## 2013-03-30 NOTE — Progress Notes (Signed)
Subjective/Complaints: Slept well  still req I/O caths.Has had to go home with foley and f/u with Dr Amalia Hailey    A 12 point review of systems has been performed and if not noted above is otherwise negative.   Objective: Vital Signs: Blood pressure 117/74, pulse 58, temperature 99.5 F (37.5 C), temperature source Oral, resp. rate 18, height _0  (1.6 m), weight 65.5 kg (144 lb 6.4 oz), SpO2 99.00%.   Recent Labs  03/28/13 0930  WBC 4.9  HGB 9.4*  HCT 27.9*  PLT 351    Recent Labs  03/28/13 0930  NA 138  K 3.7  CL 100  GLUCOSE 98  BUN 10  CREATININE 0.53  CALCIUM 9.4   CBG (last 3)   Recent Labs  03/28/13 0718  GLUCAP 85    Wt Readings from Last 3 Encounters:  03/15/13 65.5 kg (144 lb 6.4 oz)  03/15/13 65.5 kg (144 lb 6.4 oz)  03/13/13 63.2 kg (139 lb 5.3 oz)    Physical Exam:  Constitutional: She is oriented to person, place, and time. She appears well-developed and well-nourished.  HENT:  Head: Normocephalic and atraumatic.  Eyes: Pupils are equal, round, Neck: Neck supple.  Cardiovascular: Normal rate and regular rhythm.  Respiratory: Effort normal. She has normal breath sounds.  GI: Soft. Bowel sounds are normal. She exhibits no distension. There is no tenderness. There is no rebound and no guarding.  Musculoskeletal:  Leg length equal no int rotation deformity on Right side. Neurological: She is alert and oriented to person, place, and time. Coordination abnormal.  Upper ext 5/5 bilaterally. Left lower 3+ HF to 4- KE, to 4 ankle. RLE is 1+ HF, 2 KE, 3/5 ADF, 3+ APF. No sensory loss to LT  Skin: Skin is warm and dry.  Psychiatric: she is anxious   Assessment/Plan: 1. Functional deficits secondary to right FNF s/p hip hemi and subsequent dislocation. Pt also has MS.  Team conference today please see physician documentation under team conference tab, met with team face-to-face to discuss problems,progress, and goals. Formulized individual treatment plan  based on medical history, underlying problem and comorbidities. Discuss D/C date, tent D/C 1/10, pt said family training went ok yesterday  FIM: FIM - Bathing Bathing Steps Patient Completed: Chest;Right Arm;Left Arm;Abdomen;Front perineal area;Buttocks;Right upper leg;Left upper leg;Right lower leg (including foot);Left lower leg (including foot) Bathing: 5: Set-up assist to: Obtain items  FIM - Upper Body Dressing/Undressing Upper body dressing/undressing steps patient completed: Thread/unthread right bra strap;Thread/unthread left bra strap;Hook/unhook bra;Thread/unthread right sleeve of pullover shirt/dresss;Thread/unthread left sleeve of pullover shirt/dress Upper body dressing/undressing: 5: Set-up assist to: Obtain clothing/put away FIM - Lower Body Dressing/Undressing Lower body dressing/undressing steps patient completed: Pull underwear up/down;Thread/unthread right pants leg;Thread/unthread left pants leg;Pull pants up/down;Thread/unthread right underwear leg;Thread/unthread left underwear leg;Don/Doff left sock;Don/Doff right sock Lower body dressing/undressing: 5: Set-up assist to: Obtain clothing  FIM - Toileting Toileting steps completed by patient: Adjust clothing prior to toileting;Performs perineal hygiene;Adjust clothing after toileting Toileting Assistive Devices: Grab bar or rail for support Toileting: 5: Supervision: Safety issues/verbal cues  FIM - Radio producer Devices: Elevated toilet seat Toilet Transfers: 5-From toilet/BSC: Supervision (verbal cues/safety issues);4-To toilet/BSC: Min A (steadying Pt. > 75%)  FIM - Control and instrumentation engineer Devices: Walker;Orthosis;Bed rails;Arm rests (leg lifter, R hip ABD orthosis) Bed/Chair Transfer: 5: Sit > Supine: Supervision (verbal cues/safety issues);5: Supine > Sit: Supervision (verbal cues/safety issues);5: Chair or W/C > Bed: Supervision (verbal cues/safety issues);5:  Bed >  Chair or W/C: Supervision (verbal cues/safety issues) (close supervision)  FIM - Locomotion: Wheelchair Distance: 200 Locomotion: Wheelchair: 0: Activity did not occur FIM - Locomotion: Ambulation Locomotion: Ambulation Assistive Devices: Other (comment);Orthosis (R hip ABD orthosis) Ambulation/Gait Assistance: 5: Supervision Locomotion: Ambulation: 2: Travels 50 - 149 ft with supervision/safety issues  Comprehension Comprehension Mode: Auditory Comprehension: 5-Understands basic 90% of the time/requires cueing < 10% of the time  Expression Expression Mode: Verbal Expression: 6-Expresses complex ideas: With extra time/assistive device  Social Interaction Social Interaction: 6-Interacts appropriately with others with medication or extra time (anti-anxiety, antidepressant).  Problem Solving Problem Solving: 5-Solves basic problems: With no assist  Memory Memory: 6-More than reasonable amt of time  Medical Problem List and Plan:  1. DVT Prophylaxis/Anticoagulation: Pharmaceutical: Lovenox  2. Pain Management: Prn tylenol and/or baclofen.  -tylenol -will use the hydrocodone at night to help with sleep and pain -low dose hydrocodone prn if needed. 3. Mood: Provide ego support for high levels of anxiety. Educate on hip precautions.   -anxiety not helping with hip pain 4. Neuropsych: This patient is capable of making decisions on her own behalf.  5. MS with neurogenic bladder: may need to go home with foley -IC cath prn. Added urecholine Very small infreq voids -needs timed voiding schedule -augment bowel meds/program--- 6. Hypoxia: resolved  8. Leukocytosis: resolved 9. ABLA: resolved  LOS (Days) 15 A FACE TO FACE EVALUATION WAS PERFORMED  Charlett Blake 03/30/2013 9:48 AM

## 2013-03-30 NOTE — Progress Notes (Signed)
Physical Therapy Discharge Summary  Patient Details  Name: Kayla [name] MRN: [id number] Date of Birth: 1950/12/31  Today's Date: 03/30/2013 Time: 1115-1205 and 6226-3335 Time Calculation (min): 50 min and 70 min  Patient has met 11 of 11 long term goals due to improved activity tolerance, improved balance, improved postural control, increased strength, increased range of motion, ability to compensate for deficits, functional use of  right lower extremity, improved awareness and improved coordination.  Patient to discharge at a wheelchair level Modified Independent, ambulatory at supervision level. Patient's care partner is independent to provide the necessary physical assistance at discharge.  Reasons goals not met: N/A; all goals met.  Recommendation:  Patient will benefit from ongoing skilled PT services in home health setting to continue to advance safe functional mobility, address ongoing impairments in LE strength, gait stability, safety/indepedence with functional mobility while adhering to R hip precautions, and minimize fall risk.  Equipment: rollator, standard wheelchair and cushion (18"x18"), elevating leg rests, hospital bed  Reasons for discharge: treatment goals met  Patient/family agrees with progress made and goals achieved: Yes  Skilled Therapeutic Interventions/Progress Updates: Treatment Session 1: Pt received seated in w/c with R R hip ABD orthosis on. Session focused on assessing/addressing pt independence with gait and w/c mobility in controlled, home, and community environments. Performed w/c mobility x250' in controlled, home (carpeted), and community environments with mod I. Gait x108' in home environment with rolling walker and supervision. Transported pt to hospital lobby, where she performed gait x150' in community environment with supervision, verbal cueing to perform standing rest break x1 when gait quality began to decline secondary to pt fatigue. Performed  stand-pivot from bed<>w/c with mod I using rolling walker. See below for detailed findings of discharge evaluation. Transported pt to room, where session ended. Therapist departed with pt seated in w/c with hip ABD orthosis on and all needs within reach.  Treatment Session 2: Pt received semi-reclined in bed with husband and sister-in-law present; agreeable to PT. Session focused on hands-on family education of stair negotiation and car transfers. Pt performed supine>sit with mod I, bed rails, HOB flat, sit>supine with mod I, leg lifter for RLE management, and HOB flat. Performed stand-pivot from bed<>chair with mod I. Pt's husband properly donned R hip abduction brace independently with pt standing with bilat UE support at stable surface.   Pt's husband reports that pt must also negotiate standard curb to enter home. Therefore, therapist explained/demonstrated assist/cueing provided with the following aspects of functional mobility: negotiation of standard curb with rollator (forward-facing to ascend, backward-facing to descend) with min A; negotiation of 6 steps (facing sideways) with bilat UE's at single (R) rail, step-to pattern, min A to ascend, min guard to descend; car transfer with rollator and supervision. Pt's husband and sister-in-law each gave effective return demonstration of safe/proper assist/cueing with curb negotiation, stair negotiation, and car transfer. Pt's husband doffed R hip abductor orthosis and properly donned R KI with pt standing with bilat UE support.   Per pt/husband request, therapist engaged in interactive discussion of average day and assistance required for all aspects of functional mobility throughout day. Session ended in pt room, where pt was left semi-reclined in bed with KI and Prevalon boot on RLE, 3 bed rails up, bed alarm on, and all needs within reach.   PT Discharge Precautions/Restrictions Precautions Precautions: Fall;Posterior Hip Precaution Booklet Issued:  No Precaution Comments: Pt able to independently verbalize all posterior hip precautions. Required Braces or Orthoses: Knee Immobilizer - Right;Other Brace/Splint (  R hip abduction orthosis) Knee Immobilizer - Right: On at all times;Other (comment) (In bed and when using bathroom) Other Brace/Splint: hip abductor brace when OOB  Restrictions Weight Bearing Restrictions: No RLE Weight Bearing: Non weight bearing Pain Pain Assessment Pain Assessment: 0-10 Pain Score: 1  Pain Type: Acute pain Pain Location: Leg Pain Orientation: Right Pain Descriptors / Indicators: Jabbing Pain Onset: Gradual Patients Stated Pain Goal: 0 Pain Intervention(s): Elevated extremity Multiple Pain Sites: No Vision/Perception  Vision - History Baseline Vision: Wears glasses all the time Patient Visual Report: No change from baseline Vision - Assessment Eye Alignment: Within Functional Limits Vision Assessment: Vision not tested Perception Perception: Within Functional Limits Praxis Praxis: Intact  Cognition Overall Cognitive Status: Within Functional Limits for tasks assessed Arousal/Alertness: Awake/alert Orientation Level: Oriented X4 Attention: Selective Selective Attention: Appears intact Alternating Attention: Appears intact Memory: Appears intact Memory Impairment: Decreased recall of new information Awareness: Appears intact Problem Solving: Appears intact Self Monitoring: Appears intact Safety/Judgment: Appears intact Sensation Sensation Light Touch: Appears Intact Stereognosis: Appears Intact Hot/Cold: Appears Intact Proprioception: Appears Intact Coordination Gross Motor Movements are Fluid and Coordinated: No Fine Motor Movements are Fluid and Coordinated: Yes Motor  Motor Motor: Abnormal postural alignment and control Motor - Discharge Observations: In seated/standing, weight distribution grossly equal bilaterally. Unable to elicit tone in bilat LE's.  Mobility Bed  Mobility Bed Mobility: Scooting to [facility] Transfers Transfers: Yes Sit to Stand: 6: Modified independent (Device/Increase time);From elevated surface;From bed Sit to Stand Details (indicate cue type and reason): Pt consistently demonstrates good awareness of RLE position during sit<>stand from elevated bed height, (locked) rollator seat, Stand to Sit: 6: Modified independent (Device/Increase time) Stand Pivot Transfers: 6: Modified independent (Device/Increase time);From elevated surface Stand Pivot Transfer Details (indicate cue type and reason): Using rollator Locomotion  Ambulation Ambulation: Yes Ambulation/Gait Assistance: 5: Supervision Ambulation Distance (Feet): 150 Feet Assistive device: Rollator Ambulation/Gait Assistance Details: Supervision, cueing required to remind pt to take standing rest when fatigued, when quality of gait declines. Gait Gait: Yes Gait Pattern: Impaired Gait Pattern: Step-through pattern;Decreased step length - left;Decreased stance time - right;Decreased weight shift to right;Decreased hip/knee flexion - right Gait velocity: Velocity less than normal but improved since initial evaluation. High Level Ambulation High Level Ambulation: Side stepping Side Stepping: Supervision; decreased weight shift to RLE. Corporate treasurer: Yes Wheelchair Assistance: 6: Modified independent (Device/Increase time) Occupational hygienist: Both upper extremities Wheelchair Parts Management: Needs assistance;Other (comment);Independent (Consistently locks w/c brakes independently; requires assistance for management of leg rests secondary to hip precautions.) Distance: 250  Trunk/Postural Assessment  Cervical Assessment Cervical Assessment: Within Functional Limits Thoracic Assessment Thoracic Assessment: Within Functional Limits Lumbar Assessment Lumbar Assessment: Within Functional Limits Postural Control Postural Control: Deficits on  evaluation Trunk Control: WFL in seated without UE support; requires UE support in standing secondary to weight of R hip abduction orthosis. Righting Reactions: Balance recovery reactions intact in seated; delayed R ankle strategy with AP/PA balance perturbations. No stepping strategy present with perturbations in all directions. Postural Limitations: When fatigued, pt exhibits limited bilat hip extension during gait.  Balance Balance Balance Assessed: Yes Static Sitting Balance Static Sitting - Balance Support: No upper extremity supported;Feet unsupported Static Sitting - Level of Assistance: 7: Independent Static Sitting - Comment/# of Minutes: Static sitting EOB without UE support during education x4 minutes; no LOB. Dynamic Sitting Balance Dynamic Sitting - Balance Support: No upper extremity supported;Feet supported Dynamic Sitting - Level of Assistance: 6: Modified independent (Device/Increase time) Dynamic  Sitting - Balance Activities: Lateral lean/weight shifting;Forward lean/weight shifting;Reaching for objects Dynamic Sitting - Comments: Mod I; requires hip abduction orthosis to facilitate pt adherence to R hip precautions Static Standing Balance Static Standing - Balance Support: During functional activity;Bilateral upper extremity supported Static Standing - Level of Assistance: 6: Modified independent (Device/Increase time) Static Standing - Comment/# of Minutes: x3 minutes with bilat UE support at locked rollator; no LOB. Dynamic Standing Balance Dynamic Standing - Balance Support: During functional activity;Left upper extremity supported Dynamic Standing - Level of Assistance: 5: Stand by assistance Dynamic Standing - Balance Activities: Lateral lean/weight shifting;Forward lean/weight shifting;Reaching for objects Dynamic Standing - Comments: Standing with LUE support at sink x3.5 minutes, performing RUE reaching for toothbrush, toothpaste; brushing teeth. Extremity  Assessment  RUE Assessment RUE Assessment: Within Functional Limits LUE Assessment LUE Assessment: Within Functional Limits RLE Assessment RLE Assessment: Exceptions to [facility] RLE Strength RLE Overall Strength: Deficits RLE Overall Strength Comments: R hip grossly 3+/5 to 4-/5; R knee extension WFL, knee flexion 4-/5; ankle plantar/dorsiflexion and subtalar inversion; eversion 3/5 LLE Assessment LLE Assessment: Exceptions to [facility] LLE Strength LLE Overall Strength: Deficits LLE Overall Strength Comments: L hip grossly 4-/5 in all  planes; L knee 4-/5;  4/5 plantar/dorsiflexion, 4-/5 inversion/eversion  See FIM for current functional status  Laparis Durrett, Malva Cogan 03/30/2013, 12:44 PM

## 2013-03-30 NOTE — Progress Notes (Signed)
Occupational Therapy Discharge Summary  Patient Details  Name: Kayla [name] MRN: [id number] Date of Birth: [birth year]  Today's Date: 03/30/2013 Time: 6294-7654 Time calculation (min): 57 min   Patient has met 9 of 9 long term goals due to improved activity tolerance, improved balance, postural control, ability to compensate for deficits and improved awareness.  Patient to discharge at overall Supervision level and min assist for LB dressing.  Patient's care partner is independent to provide the necessary physical and cognitive assistance at discharge.  Husband has gone through family training and demonstrates the ability to provide the necessary care.   Reasons goals not met: N/A. All LTGs met.   Recommendation:  Patient does not need skilled OT services following discharge at patient is completing all self-care tasks at supervision level. Patient's main barrier is her hip abductor brace and precautions, however once these are relieved patient will be back to completing self-care tasks at Modified Independent level.  Equipment: BSC  Reasons for discharge: treatment goals met and discharge from hospital  Patient/family agrees with progress made and goals achieved: Yes  Skilled Therapeutic Intervention Pt seen for ADL retraining with focus on safety awareness, functional transfers, and activity tolerance. Pt completed all self-care tasks and functional transfers at supervision/set up assist level with min cues of reassurance for positioning of RLE. Therapist assists with donning hip abductor brace and pt verbalizes instructions of this. Pt's husband has practiced donning/doffing this and demonstrates ability to do this with increased time. Pt uses AE effectively and requests for it without cues. Pt Mod I for standing balance during clothing management around waist and while completing grooming task while standing at sink. Following tub shower pt propelled self back to room without rest breaks  demonstrating improved activity tolerance. Provided pt with handout of HEP and correctly completed 1 rep of each exercise. Pt left sitting in w/c with all needs in reach and no questions or concerns about discharge at this time.   OT Discharge Precautions/Restrictions  Precautions Precautions: Fall;Posterior Hip Precaution Booklet Issued: No Precaution Comments: Pt able to independently verbalize all posterior hip precautions. Required Braces or Orthoses: Knee Immobilizer - Right;Other Brace/Splint Other Brace/Splint: hip abductor brace when OOB  Restrictions Weight Bearing Restrictions: No General   Vital Signs   Pain Pain Assessment Pain Assessment: 0-10 Pain Score: 1  Pain Location: Hip Pain Orientation: Right Pain Descriptors / Indicators: Aching Patients Stated Pain Goal: 0 Pain Intervention(s): Medication (See eMAR) ADL   Vision/Perception  Vision - History Baseline Vision: Wears glasses all the time Patient Visual Report: No change from baseline Vision - Assessment Eye Alignment: Within Functional Limits  Cognition Overall Cognitive Status: Within Functional Limits for tasks assessed Arousal/Alertness: Awake/alert Orientation Level: Oriented X4 Attention: Selective Selective Attention: Appears intact Alternating Attention: Appears intact Memory: Appears intact Awareness: Appears intact Problem Solving: Appears intact Self Monitoring: Appears intact Safety/Judgment: Appears intact Sensation Sensation Light Touch: Appears Intact Hot/Cold: Appears Intact Coordination Gross Motor Movements are Fluid and Coordinated: No Fine Motor Movements are Fluid and Coordinated: Yes Motor    Mobility     Trunk/Postural Assessment     Balance   Extremity/Trunk Assessment RUE Assessment RUE Assessment: Within Functional Limits LUE Assessment LUE Assessment: Within Functional Limits  See FIM for current functional status  Kayla [name] N 03/30/2013, 10:47  AM

## 2013-03-31 DIAGNOSIS — G35 Multiple sclerosis: Secondary | ICD-10-CM

## 2013-03-31 DIAGNOSIS — S72009A Fracture of unspecified part of neck of unspecified femur, initial encounter for closed fracture: Secondary | ICD-10-CM

## 2013-03-31 DIAGNOSIS — T84029A Dislocation of unspecified internal joint prosthesis, initial encounter: Secondary | ICD-10-CM

## 2013-03-31 DIAGNOSIS — W19XXXA Unspecified fall, initial encounter: Secondary | ICD-10-CM

## 2013-03-31 NOTE — Progress Notes (Signed)
Pt discharged home with husband. Discharged instructions provided by Algis Liming, PA. Writer provided instructions regarding use and care of foley bag, and application of leg bag. Pt verbalized understanding, and return demonstrated application of leg bag. All questions answered. Pt escorted off unit in w/c with personal belonging by [name] Heckle and Janae Bridgeman [place], NT.

## 2013-03-31 NOTE — Plan of Care (Signed)
Problem: RH BLADDER ELIMINATION Goal: RH STG MANAGE BLADDER WITH ASSISTANCE STG Manage Bladder With Assistance. Total A  Outcome: Progressing Pt went home with foley

## 2013-03-31 NOTE — Progress Notes (Signed)
Social Work Discharge Note Discharge Note  The overall goal for the admission was met for:   Discharge location: [place], SISTER IN-LAW AND DAUGHTER'S TO ASSIST  Length of Stay: Yes-16 DAYS  Discharge activity level: Yes-SUPERVISION/MOD/I LEVEL  Home/community participation: Yes  Services provided included: MD, RD, PT, OT, SLP, RN, TR, Pharmacy and [place]: Private Insurance: [facility]  Follow-up services arranged: Home Health: [place] [facility]-PT,RN, DME: [facility] [place] and Patient/Family has no preference for HH/DME agencies  Comments (or additional information):FAMILY [place]  Patient/Family verbalized understanding of follow-up arrangements: Yes  Individual responsible for coordination of the follow-up plan: SELF & FRANK-HUSBAND  Confirmed correct DME delivered: [name] 03/31/2013    [name]

## 2013-03-31 NOTE — Plan of Care (Signed)
Problem: RH BLADDER ELIMINATION Goal: RH STG MANAGE BLADDER WITH ASSISTANCE STG Manage Bladder With Assistance. Total A  Outcome: Not Progressing Plan for foley to be inserted in am prior to d/c per orders.wbb

## 2013-03-31 NOTE — Progress Notes (Addendum)
Subjective/Complaints: Slept well after temazepam, D/Ced risperdol ,foley placed    A 12 point review of systems has been performed and if not noted above is otherwise negative.   Objective: Vital Signs: Blood pressure 122/70, pulse 63, temperature 98.5 F (36.9 C), temperature source Oral, resp. rate 17, height 5\' 3"  (1.6 m), weight 65.5 kg (144 lb 6.4 oz), SpO2 98.00%.   Recent Labs  03/28/13 0930  WBC 4.9  HGB 9.4*  HCT 27.9*  PLT 351    Recent Labs  03/28/13 0930  NA 138  K 3.7  CL 100  GLUCOSE 98  BUN 10  CREATININE 0.53  CALCIUM 9.4   CBG (last 3)  No results found for this basename: GLUCAP,  in the last 72 hours  Wt Readings from Last 3 Encounters:  03/15/13 65.5 kg (144 lb 6.4 oz)  03/15/13 65.5 kg (144 lb 6.4 oz)  03/13/13 63.2 kg (139 lb 5.3 oz)    Physical Exam:  Constitutional: She is oriented to person, place, and time. She appears well-developed and well-nourished.  HENT:  Head: Normocephalic and atraumatic.  Eyes: Pupils are equal, round, Neck: Neck supple.  Cardiovascular: Normal rate and regular rhythm.  Respiratory: Effort normal. She has normal breath sounds.  GI: Soft. Bowel sounds are normal. She exhibits no distension. There is no tenderness. There is no rebound and no guarding.  Musculoskeletal:  Leg length equal no int rotation deformity on Right side. Neurological: She is alert and oriented to person, place, and time. Coordination abnormal.  Upper ext 5/5 bilaterally. Left lower 3+ HF to 4- KE, to 4 ankle. RLE is 1+ HF, 2 KE, 3/5 ADF, 3+ APF. No sensory loss to LT  Skin: Skin is warm and dry.  Psychiatric: no lability or agitation   Assessment/Plan: 1. Functional deficits secondary to right FNF s/p hip hemi and subsequent dislocation. Pt also has MS.  Stable for D/C today F/u PCP in 1-2 weeks F/u PM&R 3 weeks F/u Neuro F/u Ortho See D/C summary See D/C instructions   FIM: FIM - Bathing Bathing Steps Patient Completed:  Chest;Right Arm;Left Arm;Abdomen;Front perineal area;Buttocks;Right upper leg;Left upper leg;Right lower leg (including foot);Left lower leg (including foot) Bathing: 5: Supervision: Safety issues/verbal cues  FIM - Upper Body Dressing/Undressing Upper body dressing/undressing steps patient completed: Thread/unthread right bra strap;Thread/unthread left bra strap;Hook/unhook bra;Thread/unthread right sleeve of pullover shirt/dresss;Thread/unthread left sleeve of pullover shirt/dress;Put head through opening of pull over shirt/dress;Pull shirt over trunk Upper body dressing/undressing: 7: Complete Independence: No helper FIM - Lower Body Dressing/Undressing Lower body dressing/undressing steps patient completed: Pull underwear up/down;Thread/unthread right pants leg;Thread/unthread left pants leg;Pull pants up/down;Thread/unthread right underwear leg;Thread/unthread left underwear leg;Don/Doff left sock;Don/Doff right sock Lower body dressing/undressing: 5: Set-up assist to: Don/Doff TED stocking  FIM - Toileting Toileting steps completed by patient: Adjust clothing prior to toileting;Performs perineal hygiene;Adjust clothing after toileting Toileting Assistive Devices: Grab bar or rail for support Toileting: 5: Supervision: Safety issues/verbal cues  FIM - Radio producer Devices: Elevated toilet seat Toilet Transfers: 5-Set-up assist to: Apply orthosis/W/C setup;5-To toilet/BSC: Supervision (verbal cues/safety issues)  FIM - Control and instrumentation engineer Devices: Walker;Orthosis;Arm rests;Bed rails Bed/Chair Transfer: 6: Bed > Chair or W/C: No assist;6: Chair or W/C > Bed: No assist;6: Supine > Sit: No assist;6: Sit > Supine: No assist  FIM - Locomotion: Wheelchair Distance: 250 Locomotion: Wheelchair: 6: Travels 150 ft or more, turns around, maneuvers to table, bed or toilet, negotiates 3% grade: maneuvers on rugs  and over door sills  independently FIM - Locomotion: Ambulation Locomotion: Ambulation Assistive Devices: Other (comment);Orthosis;Walker - Designer, multimedia) Ambulation/Gait Assistance: 5: Supervision Locomotion: Ambulation: 5: Travels 150 ft or more with supervision/safety issues  Comprehension Comprehension Mode: Auditory Comprehension: 5-Understands complex 90% of the time/Cues < 10% of the time  Expression Expression Mode: Verbal Expression: 6-Expresses complex ideas: With extra time/assistive device  Social Interaction Social Interaction: 6-Interacts appropriately with others with medication or extra time (anti-anxiety, antidepressant).  Problem Solving Problem Solving: 5-Solves basic 90% of the time/requires cueing < 10% of the time  Memory Memory: 6-More than reasonable amt of time  Medical Problem List and Plan:  1. DVT Prophylaxis/Anticoagulation: Pharmaceutical: Lovenox  2. Pain Management: Prn tylenol and/or baclofen.  -tylenol -will use the hydrocodone at night to help with sleep and pain -low dose hydrocodone prn if needed. 3. Mood: Provide ego support for high levels of anxiety. Educate on hip precautions.   -anxiety not helping with hip pain 4. Neuropsych: This patient is capable of making decisions on her own behalf.  5. MS with neurogenic bladder: may need to go home with foley    LOS (Days) 16 A FACE TO FACE EVALUATION WAS PERFORMED  Johnella Crumm E 03/31/2013 9:22 AM

## 2013-04-04 ENCOUNTER — Other Ambulatory Visit: Payer: Self-pay | Admitting: Internal Medicine

## 2013-04-05 NOTE — H&P (Signed)
Subjective:  Patient is admitted for dislocated right hip arthroplasty.  Patient is a 63 y.o. female presented with a history of pain in the right hip. Onset of symptoms was sudden when sitting in her chair. The patient had a recent past history of the dislocation of the right hip.  Patient currently rates pain in the hip at moderate with activity. There is pain on movement.  She was on CIR when it happened.  Spoke with Dr. Naaman Plummer and will bring her to the OR for a closed reduction.  Dr. Tonita Cong is on call for [facility].  Patient Active Problem List   Diagnosis Date Noted  . S/P hip hemiarthroplasty 03/15/2013  . Hypoxia 03/14/2013  . Dislocation of hip prosthesis 03/12/2013  . Hip dislocation, left 03/12/2013  . Hip dislocation, right 03/12/2013  . Acute blood loss anemia 03/11/2013  . Urinary retention with incomplete bladder emptying 03/09/2013  . Leucocytosis 03/09/2013  . Dysphagia, unspecified(787.20) 03/09/2013  . Femoral neck fracture 03/08/2013  . CAP (community acquired pneumonia) 02/26/2013  . Unspecified constipation 02/26/2013  . HCAP (healthcare-associated pneumonia) 02/26/2013  . Acute diastolic heart failure 62/70/3500  . Vocal cord paralysis 02/26/2013  . Acute respiratory failure with hypoxia 02/24/2013  . Pain 02/24/2013  . Fractured femoral neck 02/23/2013  . Hip fracture 02/23/2013  . Excessive flatus 10/21/2012  . Cold feet 07/29/2012  . Hyponatremia 05/20/2012  . Calf pain 11/17/2011  . Back pain 11/27/2010  . DYSPNEA 12/06/2007  . OSTEOPENIA 09/17/2007  . HYPOTHYROIDISM 04/01/2007  . Multiple sclerosis 04/01/2007  . Other diseases of vocal cords 04/01/2007   Past Medical History  Diagnosis Date  . Multiple sclerosis   . Unspecified hypothyroidism   . Other voice and resonance disorders   . Other diseases of vocal cords   . Cervical cancer 2005    Past Surgical History  Procedure Laterality Date  . Vesicovaginal fistula closure w/ tah     . Hip arthroplasty Right 03/01/2013    Procedure: ARTHROPLASTY BIPOLAR HIP;  Surgeon: Mauri Pole, MD;  Location: [facility];  Service: Orthopedics;  Laterality: Right;  . Hip closed reduction Right 03/12/2013    Procedure: CLOSED REDUCTION HIP;  Surgeon: Gearlean Alf, MD;  Location: [place];  Service: Orthopedics;  Laterality: Right;  . Hip closed reduction Right 03/19/2013    Procedure: CLOSED REDUCTION HIP;  Surgeon: Johnn Hai, MD;  Location: [place];  Service: Orthopedics;  Laterality: Right;    No prescriptions prior to admission   Allergies  Allergen Reactions  . Methylprednisolone Other (See Comments)    Pt states cannot take any steroids b/c they cause a decrease in her heart rate.  . Morphine And Related     Excessive sedation.   . Oxycodone-Acetaminophen     REACTION: unspecified  . Oxycodone-Acetaminophen     Rash head to toe  . Percocet [Oxycodone-Acetaminophen]     hallucinations  . Phenytoin     REACTION: rash  . Zanaflex [Tizanidine Hydrochloride]   . Phenytoin Sodium Extended Rash    History  Substance Use Topics  . Smoking status: Never Smoker   . Smokeless tobacco: Never Used  . Alcohol Use: Yes     Comment: rare    Family History  Problem Relation Age of Onset  . Colon cancer Father     Review of Systems A comprehensive review of systems was negative.  Objective: Vital signs in last 24 hours:  BP 120/61  Pulse 55  Temp(Src)  97.4 F (36.3 C) (Oral)  Resp 18  Ht 5\' 3"  (1.6 m)  Wt 63.2 kg (139 lb 5.3 oz)  BMI 24.69 kg/m2  SpO2 100%  General Appearance:    Alert, cooperative, mild distress secondary to hip pain  Head:    Normocephalic,  atraumatic  Eyes:    PERRL, EOM's intact           Neck:   Supple,      Lungs:     Clear to auscultation bilaterally      Heart:    Regular rate and rhythm, S1 and S2 normal     Abdomen:     Soft, non-tender, bowel sounds active all four quadrants,            Extremities:   Extremities normal  with the exception of the right leg, shorten with rotational deformity as compared to the left leg.  Discomfort noted on hip roll.  Pulses:   2+ and symmetric to both legs and feet  Skin:   Skin color, texture, turgor normal, no rashes or lesions          Imaging Review EXAM:  RIGHT HIP - 1 VIEW  COMPARISON: Plain film of the right hip 03/12/2013.  FINDINGS:  [name] right total hip arthroplasty is posteriorly  dislocated. No fracture is identified. Soft tissue calcifications in  the buttocks are noted.  IMPRESSION:  Posterior dislocation of the patient's right hip hemiarthroplasty.   Assessment/Plan: Dislocation of the right hip prosthesis.  Patient to be brought to the operating room for attempt closed reduction of the dislocated right hip prosthesis.  She is currently in house on the CIR unit and will likely go back to this unit for continued care postop.  Arlee Muslim, PA-C

## 2013-04-12 ENCOUNTER — Telehealth: Payer: Self-pay | Admitting: Internal Medicine

## 2013-04-12 DIAGNOSIS — N390 Urinary tract infection, site not specified: Secondary | ICD-10-CM

## 2013-04-12 DIAGNOSIS — J690 Pneumonitis due to inhalation of food and vomit: Secondary | ICD-10-CM

## 2013-04-12 DIAGNOSIS — G35 Multiple sclerosis: Secondary | ICD-10-CM

## 2013-04-12 DIAGNOSIS — S72009D Fracture of unspecified part of neck of unspecified femur, subsequent encounter for closed fracture with routine healing: Secondary | ICD-10-CM

## 2013-04-12 NOTE — Telephone Encounter (Signed)
Pt had EKG on 02-24-13 at [facility] ED; patient aware to let the nurse know the day of her appointment.

## 2013-04-19 ENCOUNTER — Encounter (INDEPENDENT_AMBULATORY_CARE_PROVIDER_SITE_OTHER): Payer: Self-pay

## 2013-04-19 ENCOUNTER — Ambulatory Visit (INDEPENDENT_AMBULATORY_CARE_PROVIDER_SITE_OTHER): Payer: 59 | Admitting: Internal Medicine

## 2013-04-19 ENCOUNTER — Encounter: Payer: Self-pay | Admitting: Internal Medicine

## 2013-04-19 VITALS — BP 112/70 | HR 80 | Temp 98.0°F

## 2013-04-19 DIAGNOSIS — E039 Hypothyroidism, unspecified: Secondary | ICD-10-CM

## 2013-04-19 DIAGNOSIS — J45909 Unspecified asthma, uncomplicated: Secondary | ICD-10-CM

## 2013-04-19 DIAGNOSIS — M899 Disorder of bone, unspecified: Secondary | ICD-10-CM

## 2013-04-19 DIAGNOSIS — M949 Disorder of cartilage, unspecified: Principal | ICD-10-CM

## 2013-04-19 NOTE — Patient Instructions (Signed)
Please see patient coordinator before you leave today  to schedule bone density  Centrum A to Z one daily   Follow up with yearly evaluation due 09/2013 - call sooner if needed

## 2013-04-19 NOTE — Progress Notes (Signed)
Subjective:    Patient ID: Kayla [name], female    DOB: [birth year]   MRN: [id number]   Brief patient profile:  56  yowf never smoker with MS since Oct 96 all neuro care through Dr [name] in [facility]  HPI October 04, 2009 Followup with PFT's. Pt states that her breathing is no better since last seen June 2011 for acute visit. She states she is still wheezing- ventolin helps and is using this up to tid. w/u c/w anemia  Mild dysphagia no more than usual. occ waking up wheezing at night despite zegerid at hs.   rec  Try  use dulera 100 prn   06/25/2010  Ov using dulera 3x weekly with good ex tol and sleeping ok. No cough. Dysphagia no worse.    rec Call if breathing worsens despite using the dulera Work on inhaler technique       12/10/2010 Follow up/NP ov Pt states she is here to get her prolia injection.  She has hx of Osteoporosis previously receiving Prolia by GYN Dr. Ree Edman but he has retired. ?last BMD in past . Will need to obtain records from their office.  She has no hx of No compression fx , or other fx. Not on steroids . Discussed options for Prolia and Reclast. Will notify after obtain BMD and records from GYN office.   rec Drug holiday, revisit in 02/2012   11/17/2011 f/u ov/Ahnyla Mendel in for f/u hypothyroidism cc 2 week L calf pain, worse when walk, radiates to achilles tendon, already on naprosyn twice daily per Dr Apolonio Schneiders x 2 years with meals as a maint, not prn, no abd c/o's x excess flatus.  No h/o leg injury or swelling. Fully mobile and able to do aerobics as long as not bearing a lot of weight.  No assoc cramps >ven doppler neg   05/20/2012 Follow up  Referred from her neurologist for low sodium level.  Seen at neuro on 2/4 w/ routine labs noted low sodium at 127. She is asymptomatic w/ no increased fatigue, muscle weakness ,  No tremors /seizure activity, confusion  Last routine labs in 10/2011 showed NA at 132.  She is on no diuretics. No dyspnea or  cough.  No new meds over last 12 months.  She denies any polydipsia , excessive thirst or polyuria.  Drinks on average 5-6 glasses of water daily .  2 cups of coffee.  Has a hx of MS followed by neurology , no recent change in rx.   07/29/2012 f/u ov/Bertha Lokken re MS Chief Complaint  Patient presents with  . Follow-up    Pt c/o swelling in her feet x 1 wk. Feet are always cold and turn purple in color. No tingling or numbness.    not clear how long she's sensed her feet have been cools but onset probably sev years and insidious, not abrupt and perfectly symmetric.  Does have assoc min dep edema bilaterally. rec Please see patient coordinator before you leave today  to schedule arterial dopplers both legs> nl  10/20/2012 f/u ov/Hodari Chuba re multiple chronic problems Chief Complaint  Patient presents with  . Annual Exam    Not fasting- Pt c/o increased flatulence. She denies feeling bloated or having any change in bowels or abd pain.    no change in diet at all or meds, no change in leg symptoms. Not limited by breathing MS no change walking or swallowing.     04/19/2013 post hosp f/u re  Hip surgery /[place] re: MS/ foley dep for now/   Chief Complaint  Patient presents with  . HFU    Pt states doing well with her breathing and denies any co's today.  no need for rescue on dulera 100 2bid  Dr Dellis Filbert  At [facility] is neurologist requesting ekg to check qt (nl)   No obvious daytime variabilty or assoc cough or chest tightness, subjective wheeze overt sinus or hb symptoms. No unusual exp hx or h/o childhood pna/ asthma or premature birth to her knowledge.   Sleeping ok without nocturnal  or early am exacerbation  of respiratory  c/o's or need for noct saba. Also denies any obvious fluctuation of symptoms with weather or environmental changes or other aggravating or alleviating factors except as outlined above   Current Medications, Allergies, Past Medical History, Past Surgical History, Family  History, and Social History were reviewed in [employer] record.  ROS  The following are not active complaints unless bolded sore throat, dysphagia, dental problems, itching, sneezing,  nasal congestion or excess/ purulent secretions, ear ache,   fever, chills, sweats, unintended wt loss, pleuritic or exertional cp, hemoptysis,  orthopnea pnd or leg swelling, presyncope, palpitations, heartburn, abdominal pain, anorexia, nausea, vomiting, diarrhea  or change in bowel or urinary habits, change in stools or urine, dysuria,hematuria,  rash, arthralgias, visual complaints, headache, numbness weakness or ataxia or problems with walking or coordination,  change in mood/affect or memory.                     Past Medical History:  MULTIPLE SCLEROSIS (ICD-340)........................Marland Kitchen Dr Effie Shy [facility], was at [facility])  - dx 10/96  Sleep Apnea.............................................................[name]      - CPAP rx 04/2010 HYPOTHYROIDISM (ICD-244.9)  OTHER DISEASES OF VOCAL CORDS (ICD-478.5)  Cervical Cancer > complete hysterectomy...........Marland Kitchen [name] HOARSENESS (ICD-784.49)................................Marland KitchenRedmond Baseman  Dyspnea/wheeze ? asthma  - HFA 50 % after coaching October 04, 2009 >  50% 06/25/2010  - PFT's October 04, 2009 FEV1 2.15 (95%) with ratio 69 and DLCO 58> corrects to 98, mild insp truncation  Health Maintenance.............................................Marland KitchenWert  - Pneumovax March 28, 2009  - Td  April 10, 2009        Objective:   Physical Exam  W/c bound  white female with nl vital signs Wt  118 > 127 March 28, 2009 >129 June 17 > 128 October 04, 2009 > 129 06/25/2010 >125 10/29/2010 > 128 11/26/2010 >>127 12/10/2010 > 11/17/2011  129> 126 05/20/2012 > 07/29/2012  129 > 10/20/2012  129 >  Wt Readings from Last 3 Encounters:  03/15/13 144 lb 6.4 oz (65.5 kg)  03/15/13 144 lb 6.4 oz (65.5 kg)  03/13/13 139 lb 5.3 oz (63.2 kg)      HEENT: nl dentition, turbinates, and orophanx. Nl external ear canals without cough reflex  Neck without JVD/Nodes/TM  Lungs clear  RRR no s3 or murmur or increase in P2  Abd soft and benign with nl excursion in the supine position. No bruits or organomegaly / no cvat Ext warm hands, sym cool feet with nl pulses and  without calf tenderness, cyanosis clubbing or edema  Skin warm and dry without lesions Neuro: in w/c, nl sensorium.        Assessment & Plan:

## 2013-04-21 ENCOUNTER — Encounter: Payer: Self-pay | Admitting: Internal Medicine

## 2013-04-21 DIAGNOSIS — J45909 Unspecified asthma, uncomplicated: Secondary | ICD-10-CM | POA: Insufficient documentation

## 2013-04-21 NOTE — Assessment & Plan Note (Signed)
Previously managed by GYN Dr. Ree Edman on oral  Bisphonates for many years then changed to Prolia- last injection 05/2010  BMD 08/14/09 Left Neck T score -2.9 , Spine nml .(no T score provided) , recommended drug holiday.  >>12/23/2010 BMD scores reviewed, discussion for Drug holiday.  - DEXA repeat rec 04/19/13

## 2013-04-21 NOTE — Assessment & Plan Note (Signed)
Lab Results  Component Value Date   TSH 0.42 10/20/2012     Adequate control on present rx, reviewed > no change in rx needed

## 2013-04-21 NOTE — Assessment & Plan Note (Signed)
Adequate control on present rx, reviewed > no change in rx needed   

## 2013-05-05 ENCOUNTER — Other Ambulatory Visit: Payer: Self-pay | Admitting: Internal Medicine

## 2013-05-06 ENCOUNTER — Encounter: Payer: 59 | Attending: Physical Medicine & Rehabilitation

## 2013-05-06 ENCOUNTER — Encounter: Payer: Self-pay | Admitting: Physical Medicine & Rehabilitation

## 2013-05-06 ENCOUNTER — Ambulatory Visit (HOSPITAL_BASED_OUTPATIENT_CLINIC_OR_DEPARTMENT_OTHER): Payer: 59 | Admitting: Physical Medicine & Rehabilitation

## 2013-05-06 VITALS — BP 107/69 | HR 82 | Resp 14 | Ht 63.0 in | Wt 133.8 lb

## 2013-05-06 DIAGNOSIS — Z96649 Presence of unspecified artificial hip joint: Secondary | ICD-10-CM

## 2013-05-06 DIAGNOSIS — T84029A Dislocation of unspecified internal joint prosthesis, initial encounter: Secondary | ICD-10-CM

## 2013-05-06 DIAGNOSIS — E039 Hypothyroidism, unspecified: Secondary | ICD-10-CM | POA: Insufficient documentation

## 2013-05-06 DIAGNOSIS — Z471 Aftercare following joint replacement surgery: Secondary | ICD-10-CM | POA: Insufficient documentation

## 2013-05-06 DIAGNOSIS — G35 Multiple sclerosis: Secondary | ICD-10-CM | POA: Insufficient documentation

## 2013-05-06 NOTE — Patient Instructions (Signed)
Concentrate on hip strengthening in physical therapy.

## 2013-05-06 NOTE — Progress Notes (Signed)
Subjective:    Patient ID: Kayla [name], female    DOB: 1951-03-20, 63 y.o.   MRN: [id number]  HPI History of hip fracture with hip dislocation, status post closed relocation as well as application of hip abduction brace. No further dislocations. Discharged home from rehabilitation 03/31/2013. Has received home health services until last Wednesday. Now receiving PT at [facility] orthopedic clinic. Patient has been trying to exercise bicycle as well as hip strengthening exercises with physical therapy. Mainly working with in the brace. Has a follow up with orthopedic surgeon and hoping to get the abduction brace off at that time. Last dislocation was 03/19/2013 Patient continues to have a Foley catheter, had urinary retention however now another issue is wearing the brace and difficulty urinating with the brace on, plans to followup with the urologist.  Also had followup appointment with primary care physician no changes in medications. Husband and family help with don/doffing abduction brace.  Not driving  Pain Inventory Average Pain 0 Pain Right Now 1 My pain is intermittent  In the last 24 hours, has pain interfered with the following? General activity 0 Relation with others 0 Enjoyment of life 0 What TIME of day is your pain at its worst? varies Sleep (in general) Good  Pain is worse with: na Pain improves with: therapy/exercise Relief from Meds: 1  Mobility use a walker ability to climb steps?  yes do you drive?  no needs help with transfers  Function disabled: date disabled 1999  Neuro/Psych bladder control problems  Prior Studies Any changes since last visit?  no  Physicians involved in your care Any changes since last visit?  no   Family History  Problem Relation Age of Onset  . Colon cancer Father    History   Social History  . Marital Status: Married    Spouse Name: N/A    Number of Children: N/A  . Years of Education: N/A   Occupational History    . disabled [place] History Main Topics  . Smoking status: Never Smoker   . Smokeless tobacco: Never Used  . Alcohol Use: Yes     Comment: rare  . Drug Use: No  . Sexual Activity: None   Other Topics Concern  . None   Social History Narrative  . None   Past Surgical History  Procedure Laterality Date  . Vesicovaginal fistula closure w/ tah    . Hip arthroplasty Right 03/01/2013    Procedure: ARTHROPLASTY BIPOLAR HIP;  Surgeon: Mauri Pole, MD;  Location: [facility];  Service: Orthopedics;  Laterality: Right;  . Hip closed reduction Right 03/12/2013    Procedure: CLOSED REDUCTION HIP;  Surgeon: Gearlean Alf, MD;  Location: [place];  Service: Orthopedics;  Laterality: Right;  . Hip closed reduction Right 03/19/2013    Procedure: CLOSED REDUCTION HIP;  Surgeon: Johnn Hai, MD;  Location: [place];  Service: Orthopedics;  Laterality: Right;   Past Medical History  Diagnosis Date  . Multiple sclerosis   . Unspecified hypothyroidism   . Other voice and resonance disorders   . Other diseases of vocal cords   . Cervical cancer 2005   BP 107/69  Pulse 82  Resp 14  Ht 5\' 3"  (1.6 m)  Wt 133 lb 12.8 oz (60.691 kg)  BMI 23.71 kg/m2  SpO2 98%  Opioid Risk Score:   Fall Risk Score: Moderate Fall Risk (6-13 points) (educated and handout on home fall prevention given.)  Review of Systems  Respiratory: Positive for apnea.   Genitourinary:       Bladder control problems  All other systems reviewed and are negative.       Objective:   Physical Exam  Ambulates with hip abductor brace as well as rolling walker. Does have Trendelenburg gait pattern Motor strength is 4/5 bilateral knee extensors and 5/5 dorsiflexors plantar flexors 5/5 in bilateral deltoid, bicep, tricep, grip Mood and affect are appropriate       Assessment & Plan:  1. History of right hip fracture status post arthroplasty with postoperative dislocation x2. Now has switched from home  health to outpatient therapy. Still in hip abduction brace. This will be discontinued by orthopedics after further evaluation. She has made all the appropriate followup appointments. Do not need any further PM and R. followup at this point RTC when necessary

## 2013-05-25 ENCOUNTER — Other Ambulatory Visit: Payer: Self-pay | Admitting: Internal Medicine

## 2013-06-15 ENCOUNTER — Ambulatory Visit (INDEPENDENT_AMBULATORY_CARE_PROVIDER_SITE_OTHER)
Admission: RE | Admit: 2013-06-15 | Discharge: 2013-06-15 | Disposition: A | Discharge status: Home / Self Care | Payer: 59 | Source: Ambulatory Visit | Attending: Internal Medicine | Admitting: Internal Medicine

## 2013-06-15 DIAGNOSIS — M949 Disorder of cartilage, unspecified: Principal | ICD-10-CM

## 2013-06-15 DIAGNOSIS — M899 Disorder of bone, unspecified: Secondary | ICD-10-CM

## 2013-06-20 ENCOUNTER — Encounter: Payer: Self-pay | Admitting: Internal Medicine

## 2013-06-20 NOTE — Progress Notes (Signed)
Quick Note:  Spoke with pt and notified of results per Dr. [name]. Pt verbalized understanding and denied any questions.  ______ 

## 2013-06-21 ENCOUNTER — Other Ambulatory Visit (INDEPENDENT_AMBULATORY_CARE_PROVIDER_SITE_OTHER): Payer: 59

## 2013-06-21 ENCOUNTER — Ambulatory Visit (INDEPENDENT_AMBULATORY_CARE_PROVIDER_SITE_OTHER): Payer: 59 | Admitting: Adult Health

## 2013-06-21 ENCOUNTER — Encounter: Payer: Self-pay | Admitting: Adult Health

## 2013-06-21 VITALS — BP 104/66 | HR 63 | Temp 98.9°F | Ht 63.0 in | Wt 135.8 lb

## 2013-06-21 DIAGNOSIS — M899 Disorder of bone, unspecified: Secondary | ICD-10-CM

## 2013-06-21 DIAGNOSIS — M949 Disorder of cartilage, unspecified: Principal | ICD-10-CM

## 2013-06-21 LAB — BASIC METABOLIC PANEL
BUN: 13 mg/dL (ref 6–23)
CO2: 27 mEq/L (ref 19–32)
Calcium: 9.3 mg/dL (ref 8.4–10.5)
Chloride: 99 mEq/L (ref 96–112)
Creatinine, Ser: 0.8 mg/dL (ref 0.4–1.2)
GFR: 82.26 mL/min (ref >60.00)
Glucose, Bld: 96 mg/dL (ref 70–99)
Potassium: 4.1 mEq/L (ref 3.5–5.1)
Sodium: 134 mEq/L — ABNORMAL LOW (ref 135–145)

## 2013-06-21 LAB — VITAMIN D 25 HYDROXY (VIT D DEFICIENCY, FRACTURES): Vit D, 25-Hydroxy: 34 ng/mL (ref 30–89)

## 2013-06-21 NOTE — Addendum Note (Signed)
Addended by: Parke Poisson E on: 06/21/2013 04:27 PM   Modules accepted: Orders

## 2013-06-21 NOTE — Assessment & Plan Note (Signed)
Osteoporosis by bone density. Patient has recently been on a drug holiday since 2012 (Prolia)  Recent hip fx   Plan :   Begin Oscal D Twice daily   Labs today  We will set up for Reclast Infusion for osteoporosis  follow up Dr. Melvyn Novas  In July for physical .

## 2013-06-21 NOTE — Patient Instructions (Addendum)
Begin Oscal D Twice daily   Labs today  We will set up for Reclast Infusion for osteoporosis  follow up Dr. Melvyn Novas  In July for physical .

## 2013-06-21 NOTE — Progress Notes (Signed)
Subjective:    Patient ID: Kayla [name], female    DOB: [birth year]   MRN: [id number]   Brief patient profile:  56  yowf never smoker with MS since Oct 96 all neuro care through Dr [name] in [facility]  HPI October 04, 2009 Followup with PFT's. Pt states that her breathing is no better since last seen June 2011 for acute visit. She states she is still wheezing- ventolin helps and is using this up to tid. w/u c/w anemia  Mild dysphagia no more than usual. occ waking up wheezing at night despite zegerid at hs.   rec  Try  use dulera 100 prn   06/25/2010  Ov using dulera 3x weekly with good ex tol and sleeping ok. No cough. Dysphagia no worse.    rec Call if breathing worsens despite using the dulera Work on inhaler technique       12/10/2010 Follow up/NP ov Pt states she is here to get her prolia injection.  She has hx of Osteoporosis previously receiving Prolia by GYN Dr. Ree Edman but he has retired. ?last BMD in past . Will need to obtain records from their office.  She has no hx of No compression fx , or other fx. Not on steroids . Discussed options for Prolia and Reclast. Will notify after obtain BMD and records from GYN office.   rec Drug holiday, revisit in 02/2012   11/17/2011 f/u ov/[name] in for f/u hypothyroidism cc 2 week L calf pain, worse when walk, radiates to achilles tendon, already on naprosyn twice daily per Dr Apolonio Schneiders x 2 years with meals as a maint, not prn, no abd c/o's x excess flatus.  No h/o leg injury or swelling. Fully mobile and able to do aerobics as long as not bearing a lot of weight.  No assoc cramps >ven doppler neg   05/20/2012 Follow up  Referred from her neurologist for low sodium level.  Seen at neuro on 2/4 w/ routine labs noted low sodium at 127. She is asymptomatic w/ no increased fatigue, muscle weakness ,  No tremors /seizure activity, confusion  Last routine labs in 10/2011 showed NA at 132.  She is on no diuretics. No dyspnea or  cough.  No new meds over last 12 months.  She denies any polydipsia , excessive thirst or polyuria.  Drinks on average 5-6 glasses of water daily .  2 cups of coffee.  Has a hx of MS followed by neurology , no recent change in rx.   07/29/2012 f/u ov/[name] re MS Chief Complaint  Patient presents with  . Follow-up    Pt c/o swelling in her feet x 1 wk. Feet are always cold and turn purple in color. No tingling or numbness.    not clear how long she's sensed her feet have been cools but onset probably sev years and insidious, not abrupt and perfectly symmetric.  Does have assoc min dep edema bilaterally. rec Please see patient coordinator before you leave today  to schedule arterial dopplers both legs> nl  10/20/2012 f/u ov/[name] re multiple chronic problems Chief Complaint  Patient presents with  . Annual Exam    Not fasting- Pt c/o increased flatulence. She denies feeling bloated or having any change in bowels or abd pain.    no change in diet at all or meds, no change in leg symptoms. Not limited by breathing MS no change walking or swallowing.     04/19/2013 post hosp f/u re  Hip surgery /[name] re: MS/ foley dep for now/   Chief Complaint  Patient presents with  . HFU    Pt states doing well with her breathing and denies any co's today.  no need for rescue on dulera 100 2bid  Dr Dellis Filbert  At [facility] is neurologist requesting ekg to check qt (nl) >>no changes    06/21/2013 Follow up for osteoporosis  Patient returns for a two-month followup. Patient had a bone density on March 25 showed osteoporosis with a T score of -2.8 at the radius. Had recent hip surgery w/ recent dislocation requiring surgery. Undergoing PT . Using walker.  We discussed restarting osteoporosis tx , she is aggreement. We discussed the risks and benefits of therapy.     Current Medications, Allergies, Past Medical History, Past Surgical History, Family History, and Social History were reviewed in [employer] record.  ROS  The following are not active complaints unless bolded sore throat, dysphagia, dental problems, itching, sneezing,  nasal congestion or excess/ purulent secretions, ear ache,   fever, chills, sweats, unintended wt loss, pleuritic or exertional cp, hemoptysis,  orthopnea pnd or leg swelling, presyncope, palpitations, heartburn, abdominal pain, anorexia, nausea, vomiting, diarrhea  or change in bowel or urinary habits, change in stools or urine, dysuria,hematuria,  rash, arthralgias, visual complaints, headache, numbness weakness or ataxia or problems with walking or coordination,  change in mood/affect or memory.      Past Medical History:  MULTIPLE SCLEROSIS (ICD-340)........................Marland Kitchen Dr Effie Shy [facility], was at [facility])  - dx 10/96  Sleep Apnea.............................................................[name]      - CPAP rx 04/2010 HYPOTHYROIDISM (ICD-244.9)  OTHER DISEASES OF VOCAL CORDS (ICD-478.5)  Cervical Cancer > complete hysterectomy...........Marland Kitchen [name] HOARSENESS (ICD-784.49)................................Marland KitchenRedmond Baseman  Dyspnea/wheeze ? asthma  - HFA 50 % after coaching October 04, 2009 >  50% 06/25/2010  - PFT's October 04, 2009 FEV1 2.15 (95%) with ratio 69 and DLCO 58> corrects to 98, mild insp truncation  Health Maintenance.............................................Marland KitchenWert  - Pneumovax March 28, 2009  - Td  April 10, 2009        Objective:   Physical Exam  W/c bound  white female with nl vital signs Wt  118 > 127 March 28, 2009 >129 June 17 > 128 October 04, 2009 > 129 06/25/2010 >125 10/29/2010 > 128 11/26/2010 >>127 12/10/2010 > 11/17/2011  129> 126 05/20/2012 > 07/29/2012  129 > 10/20/2012  129 >  135 06/21/2013     HEENT: nl dentition, turbinates, and orophanx. Nl external ear canals without cough reflex  Neck without JVD/Nodes/TM  Lungs clear  RRR no s3 or murmur or increase in P2  Abd soft and benign with nl  excursion in the supine position. No bruits or organomegaly / no cvat Ext warm hands, sym cool feet with nl pulses and  without calf tenderness, cyanosis clubbing or edema  Skin warm and dry without lesions Neuro: in w/c, nl sensorium.        Assessment & Plan:

## 2013-07-20 ENCOUNTER — Other Ambulatory Visit (HOSPITAL_COMMUNITY): Payer: Self-pay | Admitting: Adult Health

## 2013-07-20 ENCOUNTER — Encounter (HOSPITAL_COMMUNITY): Payer: Self-pay

## 2013-07-20 ENCOUNTER — Encounter (HOSPITAL_COMMUNITY)
Admission: RE | Admit: 2013-07-20 | Discharge: 2013-07-20 | Disposition: A | Discharge status: Home / Self Care | Payer: 59 | Source: Ambulatory Visit | Attending: Internal Medicine | Admitting: Internal Medicine

## 2013-07-20 DIAGNOSIS — X58XXXA Exposure to other specified factors, initial encounter: Secondary | ICD-10-CM | POA: Insufficient documentation

## 2013-07-20 DIAGNOSIS — S72009A Fracture of unspecified part of neck of unspecified femur, initial encounter for closed fracture: Secondary | ICD-10-CM | POA: Insufficient documentation

## 2013-07-20 DIAGNOSIS — M810 Age-related osteoporosis without current pathological fracture: Secondary | ICD-10-CM | POA: Insufficient documentation

## 2013-07-20 HISTORY — DX: Age-related osteoporosis without current pathological fracture: M81.0

## 2013-07-20 MED ORDER — SODIUM CHLORIDE 0.9 % IV SOLN
Freq: Once | INTRAVENOUS | Status: AC
  Administered 2013-07-20: 12:00:00 via INTRAVENOUS

## 2013-07-20 MED ORDER — ZOLEDRONIC ACID 5 MG/100ML IV SOLN
5.0000 mg | Freq: Once | INTRAVENOUS | Status: AC
  Administered 2013-07-20: 5 mg via INTRAVENOUS
  Filled 2013-07-20: qty 100

## 2013-08-05 ENCOUNTER — Encounter (HOSPITAL_COMMUNITY): Payer: 59

## 2013-08-29 ENCOUNTER — Other Ambulatory Visit: Payer: Self-pay | Admitting: Internal Medicine

## 2013-09-26 ENCOUNTER — Telehealth: Payer: Self-pay | Admitting: Gastroenterology

## 2013-09-26 NOTE — Telephone Encounter (Signed)
Normal colon in 2011. If she has recurrent episodes of bleeding schedule office appt.

## 2013-09-26 NOTE — Telephone Encounter (Signed)
Spoke with the patient-she denies any pain or rectal irritation-she saw a little blood on the tissue with her BM yesterday-she denies constipation and states her bowels are a little loose but not diarrhea-she is not taking stool softeners-she wants to know if she should be concerned

## 2013-09-26 NOTE — Telephone Encounter (Signed)
Patient advised.

## 2013-11-04 ENCOUNTER — Encounter (INDEPENDENT_AMBULATORY_CARE_PROVIDER_SITE_OTHER): Payer: Self-pay

## 2013-11-04 ENCOUNTER — Ambulatory Visit (INDEPENDENT_AMBULATORY_CARE_PROVIDER_SITE_OTHER)
Admission: RE | Admit: 2013-11-04 | Discharge: 2013-11-04 | Disposition: A | Discharge status: Home / Self Care | Payer: 59 | Source: Ambulatory Visit | Attending: Internal Medicine | Admitting: Internal Medicine

## 2013-11-04 ENCOUNTER — Other Ambulatory Visit (INDEPENDENT_AMBULATORY_CARE_PROVIDER_SITE_OTHER): Payer: 59

## 2013-11-04 ENCOUNTER — Telehealth: Payer: Self-pay | Admitting: Internal Medicine

## 2013-11-04 ENCOUNTER — Ambulatory Visit (INDEPENDENT_AMBULATORY_CARE_PROVIDER_SITE_OTHER): Payer: 59 | Admitting: Internal Medicine

## 2013-11-04 ENCOUNTER — Encounter: Payer: Self-pay | Admitting: Internal Medicine

## 2013-11-04 VITALS — BP 120/80 | HR 67 | Temp 98.0°F | Ht 63.0 in | Wt 135.0 lb

## 2013-11-04 DIAGNOSIS — E785 Hyperlipidemia, unspecified: Secondary | ICD-10-CM | POA: Insufficient documentation

## 2013-11-04 DIAGNOSIS — J4520 Mild intermittent asthma, uncomplicated: Secondary | ICD-10-CM

## 2013-11-04 DIAGNOSIS — R0989 Other specified symptoms and signs involving the circulatory and respiratory systems: Secondary | ICD-10-CM

## 2013-11-04 DIAGNOSIS — R0609 Other forms of dyspnea: Secondary | ICD-10-CM

## 2013-11-04 DIAGNOSIS — E039 Hypothyroidism, unspecified: Secondary | ICD-10-CM

## 2013-11-04 DIAGNOSIS — J45909 Unspecified asthma, uncomplicated: Secondary | ICD-10-CM

## 2013-11-04 DIAGNOSIS — G35 Multiple sclerosis: Secondary | ICD-10-CM

## 2013-11-04 LAB — CBC WITH DIFFERENTIAL/PLATELET
Basophils Absolute: 0 10*3/uL (ref 0.0–0.1)
Basophils Relative: 0.7 % (ref 0.0–3.0)
Eosinophils Absolute: 0.1 10*3/uL (ref 0.0–0.7)
Eosinophils Relative: 3.6 % (ref 0.0–5.0)
HCT: 36.2 % (ref 36.0–46.0)
Hemoglobin: 12.4 g/dL (ref 12.0–15.0)
Lymphocytes Relative: 22.8 % (ref 12.0–46.0)
Lymphs Abs: 0.7 10*3/uL (ref 0.7–4.0)
MCHC: 34.3 g/dL (ref 30.0–36.0)
MCV: 94.9 fl (ref 78.0–100.0)
Monocytes Absolute: 0.5 10*3/uL (ref 0.1–1.0)
Monocytes Relative: 15.7 % — ABNORMAL HIGH (ref 3.0–12.0)
Neutro Abs: 1.8 10*3/uL (ref 1.4–7.7)
Neutrophils Relative %: 57.2 % (ref 43.0–77.0)
Platelets: 302 10*3/uL (ref 150.0–400.0)
RBC: 3.82 Mil/uL — ABNORMAL LOW (ref 3.87–5.11)
RDW: 12.8 % (ref 11.5–15.5)
WBC: 3.2 10*3/uL — ABNORMAL LOW (ref 4.0–10.5)

## 2013-11-04 LAB — TSH: TSH: 4.88 u[IU]/mL — ABNORMAL HIGH (ref 0.35–4.50)

## 2013-11-04 LAB — HEPATIC FUNCTION PANEL
ALT: 18 U/L (ref 0–35)
AST: 26 U/L (ref 0–37)
Albumin: 4.2 g/dL (ref 3.5–5.2)
Alkaline Phosphatase: 51 U/L (ref 39–117)
Bilirubin, Direct: 0.1 mg/dL (ref 0.0–0.3)
Total Bilirubin: 0.8 mg/dL (ref 0.2–1.2)
Total Protein: 6.9 g/dL (ref 6.0–8.3)

## 2013-11-04 LAB — BASIC METABOLIC PANEL
BUN: 13 mg/dL (ref 6–23)
CO2: 26 mEq/L (ref 19–32)
Calcium: 9.9 mg/dL (ref 8.4–10.5)
Chloride: 97 mEq/L (ref 96–112)
Creatinine, Ser: 0.6 mg/dL (ref 0.4–1.2)
GFR: 114.5 mL/min (ref >60.00)
Glucose, Bld: 86 mg/dL (ref 70–99)
Potassium: 4.4 mEq/L (ref 3.5–5.1)
Sodium: 132 mEq/L — ABNORMAL LOW (ref 135–145)

## 2013-11-04 MED ORDER — LEVOTHYROXINE SODIUM 125 MCG PO TABS: 125.0000 ug | ORAL_TABLET | Freq: Every day | ORAL | Status: DC

## 2013-11-04 NOTE — Assessment & Plan Note (Signed)
Lab Results  Component Value Date   CHOL 205* 06/25/2010   HDL 65.00 06/25/2010   LDLCALC 114* 09/20/2007   LDLDIRECT 116.1 06/25/2010   TRIG 77.0 06/25/2010   CHOLHDL 3 06/25/2010     No problem identified, no need for meds

## 2013-11-04 NOTE — Assessment & Plan Note (Signed)
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

## 2013-11-04 NOTE — Telephone Encounter (Signed)
I spoke with patient about results and she verbalized understanding and had no questions 

## 2013-11-04 NOTE — Progress Notes (Signed)
Quick Note:  LMTCB ______ 

## 2013-11-04 NOTE — Progress Notes (Signed)
Subjective:    Patient ID: Kayla [name], female    DOB: December 02, 1950   MRN: [id number]   Brief patient profile:  27  yowf never smoker with MS since Oct 96 all neuro care through Dr Kayla [name] in [facility] ([facility] of [facility]),   Kayla [name] at [facility] urology,  [name]  Followed here for Primary care   HPI October 04, 2009 Followup with PFT's. Pt states that her breathing is no better since last seen June 2011 for acute visit. She states she is still wheezing- ventolin helps and is using this up to tid. w/u c/w anemia  Mild dysphagia no more than usual. occ waking up wheezing at night despite zegerid at hs.   rec  Try  use dulera 100 prn  06/21/2013 Follow up for osteoporosis  Patient returns for a two-month followup. Patient had a bone density on March 25 showed osteoporosis with a T score of -2.8 at the radius. Had recent hip surgery w/ recent dislocation requiring surgery. Undergoing PT . Using walker.  We discussed restarting osteoporosis tx , she is aggreement. We discussed the risks and benefits of therapy.  Begin Oscal D Twice daily   Labs today  We will set up for Reclast Infusion for osteoporosis    11/04/2013 f/u ov/Kayla [name] re: annual eva/ MS/ hypothyroid/osteoporosis  Chief Complaint  Patient presents with  . Annual Exam    Pt fasting. She states that she is doing well and denies any co's today.   ex = pool x 2 per week Walks with cane / HC parking/ [name] ok s scooter No cough or sob   No obvious day to day or daytime variabilty or assoc chronic cough or cp or chest tightness, subjective wheeze overt sinus or hb symptoms. No unusual exp hx or h/o childhood pna/ asthma or knowledge of premature birth.  Sleeping ok without nocturnal  or early am exacerbation  of respiratory  c/o's or need for noct saba. Also denies any obvious fluctuation of symptoms with weather or environmental changes or other aggravating or alleviating factors except as outlined above   Current  Medications, Allergies, Complete Past Medical History, Past Surgical History, Family History, and Social History were reviewed in [employer] record.  ROS  The following are not active complaints unless bolded sore throat, dysphagia, dental problems, itching, sneezing,  nasal congestion or excess/ purulent secretions, ear ache,   fever, chills, sweats, unintended wt loss, pleuritic or exertional cp, hemoptysis,  orthopnea pnd or leg swelling, presyncope, palpitations, heartburn, abdominal pain, anorexia, nausea, vomiting, diarrhea  or change in bowel or urinary habits, change in stools or urine, dysuria,hematuria,  rash, arthralgias, visual complaints, headache, numbness weakness or ataxia or problems with walking or coordination,  change in mood/affect or memory.       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  Kayla [name]  Dyspnea/wheeze ? asthma  - HFA 50 % after coaching October 04, 2009 >  50% 06/25/2010  - PFT's October 04, 2009 FEV1 2.15 (95%) with ratio 69 and DLCO 58> corrects to 98, mild insp truncation  Health Maintenance.............................................Kayla [name] [facility]  - Pneumovax March 28, 2009  - Td  April 10, 2009        Objective:  Physical Exam  amb hoarse white female nad / walks ith cane  Wt  118 > 127 March 28, 2009 >129 June 17 > 128 October 04, 2009 > 129 06/25/2010 >125 10/29/2010 > 128 11/26/2010 >>127 12/10/2010 > 11/17/2011  129> 126 05/20/2012 > 07/29/2012  129 > 10/20/2012  129 >  135 06/21/2013 >  11/04/2013 135     HEENT: nl dentition, turbinates, and  orophanx. Nl external ear canals without cough reflex  Neck without JVD/Nodes/TM  Lungs clear  RRR no s3 or murmur or increase in P2  Abd soft and benign with nl excursion in the supine position. No bruits or organomegaly / no cvat Ext warm hands, sym cool feet with nl pulses and  without calf tenderness, cyanosis clubbing or edema  Skin warm and dry without lesions Neuro:  nl sensorium, walks with cane, no obvious motor deficits    CXR  11/04/2013 :  No edema or consolidation.     Recent Labs Lab 11/04/13 1102  NA 132*  K 4.4  CL 97  CO2 26  BUN 13  CREATININE 0.6  GLUCOSE 86    Recent Labs Lab 11/04/13 1102  HGB 12.4  HCT 36.2  WBC 3.2*  PLT 302.0     Lab Results  Component Value Date   TSH 4.88* 11/04/2013     Assessment & Plan:

## 2013-11-04 NOTE — Assessment & Plan Note (Signed)
Followed by  Dr Effie Shy [facility], was at [facility])

## 2013-11-04 NOTE — Patient Instructions (Signed)
Please remember to go to the lab and x-ray department downstairs for your tests - we will call you with the results when they are available.      

## 2013-11-04 NOTE — Assessment & Plan Note (Signed)
All goals of chronic asthma control met including optimal function and elimination of symptoms with minimal need for rescue therapy.  Contingencies discussed in full including contacting this office immediately if not controlling the symptoms using the rule of two's.    

## 2013-11-04 NOTE — Assessment & Plan Note (Signed)
Not Adequate control on present rx, reviewed >  change in to 125 mcg per day

## 2013-12-16 ENCOUNTER — Encounter: Payer: Self-pay | Admitting: Internal Medicine

## 2013-12-16 DIAGNOSIS — Z0000 Encounter for general adult medical examination without abnormal findings: Secondary | ICD-10-CM | POA: Insufficient documentation

## 2013-12-27 ENCOUNTER — Encounter: Payer: Self-pay | Admitting: Internal Medicine

## 2014-04-04 ENCOUNTER — Telehealth: Payer: Self-pay | Admitting: Internal Medicine

## 2014-04-04 ENCOUNTER — Encounter: Payer: Self-pay | Admitting: Adult Health

## 2014-04-04 ENCOUNTER — Other Ambulatory Visit: Payer: Medicare Other

## 2014-04-04 ENCOUNTER — Ambulatory Visit (INDEPENDENT_AMBULATORY_CARE_PROVIDER_SITE_OTHER): Payer: Medicare Other | Admitting: Adult Health

## 2014-04-04 VITALS — BP 136/72 | HR 65 | Temp 97.9°F | Ht 63.0 in | Wt 134.6 lb

## 2014-04-04 DIAGNOSIS — J029 Acute pharyngitis, unspecified: Secondary | ICD-10-CM

## 2014-04-04 DIAGNOSIS — J069 Acute upper respiratory infection, unspecified: Secondary | ICD-10-CM

## 2014-04-04 LAB — BETA STREP SCREEN: Streptococcus, Group A Screen (Direct): NEGATIVE

## 2014-04-04 NOTE — Telephone Encounter (Signed)
Pt c/o sorethroat, diff swallowing, feels like throat is gonna close up, hoarseness.  Denies fever or sinus drainage.  Pt is flying out of town on Thursday and would like to be checked for strep.  Pt to see Tammy Parrett today at 11:15.

## 2014-04-04 NOTE — Progress Notes (Signed)
Subjective:    Patient ID: Kayla [name], female    DOB: 1950-11-21   MRN: [id number]   Brief patient profile:  29  yowf never smoker with MS since Oct 96 all neuro care through Dr [name] in [facility] ([facility]),   [name] at [facility] urology,  [place] [facility]  Followed here for Primary care   HPI October 04, 2009 Followup with PFT's. Pt states that her breathing is no better since last seen June 2011 for acute visit. She states she is still wheezing- ventolin helps and is using this up to tid. w/u c/w anemia  Mild dysphagia no more than usual. occ waking up wheezing at night despite zegerid at hs.   rec  Try  use dulera 100 prn  06/21/2013 Follow up for osteoporosis  Patient returns for a two-month followup. Patient had a bone density on March 25 showed osteoporosis with a T score of -2.8 at the radius. Had recent hip surgery w/ recent dislocation requiring surgery. Undergoing PT . Using walker.  We discussed restarting osteoporosis tx , she is aggreement. We discussed the risks and benefits of therapy.  Begin Oscal D Twice daily   Labs today  We will set up for Reclast Infusion for osteoporosis    11/04/2013 f/u ov/[name] re: annual eva/ MS/ hypothyroid/osteoporosis  Chief Complaint  Patient presents with  . Annual Exam    Pt fasting. She states that she is doing well and denies any co's today.   ex = pool x 2 per week Walks with cane / HC parking/ Zito teeter ok s scooter No cough or sob  >  04/04/2014 Acute OV  Complains of sorethroat since last night,really sore,PND,no congestion,no fcs,not aware of an exposure to sickness,cough dry today  Leaving to go [place] for [employer].  No fever, chest pain, discolored mucus , n/v/d, abd pain , neck stiffness or rash.  Appetite is good.  No family is sick  No otc used.    Current Medications, Allergies, Complete Past Medical History, Past Surgical History, Family History, and Social History were reviewed in [employer] record.  ROS  The following are not active complaints unless bolded sore throat, dysphagia, dental problems, itching, sneezing,  nasal congestion or excess/ purulent secretions, ear ache,   fever, chills, sweats, unintended wt loss, pleuritic or exertional cp, hemoptysis,  orthopnea pnd or leg swelling, presyncope, palpitations, heartburn, abdominal pain, anorexia, nausea, vomiting, diarrhea  or change in bowel or urinary habits, change in stools or urine, dysuria,hematuria,  rash, arthralgias, visual complaints, headache, numbness weakness or ataxia or problems with walking or coordination,  change in mood/affect or memory.       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  [name]  Dyspnea/wheeze ? asthma  - HFA 50 % after coaching October 04, 2009 >  50% 06/25/2010  - PFT's October 04, 2009 FEV1 2.15 (95%) with ratio 69 and DLCO 58> corrects to 98, mild insp truncation  Health Maintenance.............................................Marland KitchenWert  - Pneumovax March 28, 2009  - Td  April 10, 2009        Objective:   Physical Exam  amb hoarse white female nad / walks ith cane  Wt  118 >  127 March 28, 2009 >129 June 17 > 128 October 04, 2009 > 129 06/25/2010 >125 10/29/2010 > 128 11/26/2010 >>127 12/10/2010 > 11/17/2011  129> 126 05/20/2012 > 07/29/2012  129 > 10/20/2012  129 >  135 06/21/2013 >  11/04/2013 135 >134 04/04/2014     HEENT: nl dentition, turbinates, and orophanx. Nl external ear canals without cough reflex  Neck without JVD/Nodes/TM  Lungs clear  RRR no s3 or murmur or  increase in P2  Abd soft and benign with nl excursion in the supine position. No bruits or organomegaly / no cvat Ext warm hands, sym cool feet with nl pulses and  without calf tenderness, cyanosis clubbing or edema  Skin warm and dry without lesions Neuro:  nl sensorium, walks with cane, no obvious motor deficits    CXR  11/04/2013 :  No edema or consolidation.     Recent Labs Lab 11/04/13 1102  NA 132*  K 4.4  CL 97  CO2 26  BUN 13  CREATININE 0.6  GLUCOSE 86    Recent Labs Lab 11/04/13 1102  HGB 12.4  HCT 36.2  WBC 3.2*  PLT 302.0     Lab Results  Component Value Date   TSH 4.88* 11/04/2013     Assessment & Plan:

## 2014-04-04 NOTE — Patient Instructions (Addendum)
Salt water gargles  As needed   Tylenol As needed   Fluids and rest  Claritin 10mg  daily As needed  For drainage  Strep test today  Please contact office for sooner follow up if symptoms do not improve or worsen or seek emergency care

## 2014-04-04 NOTE — Assessment & Plan Note (Signed)
URI -suspect is viral  Check strep test   Plan  Salt water gargles  As needed   Tylenol As needed   Fluids and rest  Claritin 10mg  daily As needed  For drainage  Strep test today  Please contact office for sooner follow up if symptoms do not improve or worsen or seek emergency care

## 2014-04-06 ENCOUNTER — Encounter (HOSPITAL_COMMUNITY): Payer: Self-pay | Admitting: Orthopedic Surgery

## 2014-04-07 ENCOUNTER — Other Ambulatory Visit: Payer: Self-pay | Admitting: Internal Medicine

## 2014-04-18 ENCOUNTER — Ambulatory Visit (INDEPENDENT_AMBULATORY_CARE_PROVIDER_SITE_OTHER): Payer: 59 | Admitting: Internal Medicine

## 2014-04-18 ENCOUNTER — Encounter: Payer: Self-pay | Admitting: Internal Medicine

## 2014-04-18 VITALS — BP 112/70 | HR 76 | Temp 98.8°F | Ht 63.0 in | Wt 135.0 lb

## 2014-04-18 DIAGNOSIS — J069 Acute upper respiratory infection, unspecified: Secondary | ICD-10-CM

## 2014-04-18 MED ORDER — PREDNISONE 10 MG PO TABS: ORAL_TABLET | ORAL | Status: DC

## 2014-04-18 MED ORDER — AZITHROMYCIN 250 MG PO TABS: ORAL_TABLET | ORAL | Status: DC

## 2014-04-18 NOTE — Assessment & Plan Note (Signed)
Mod severe symptoms which have resulted from a Probably initially viral infection 2 weeks ago but now prominent nasal/ chest congestion s evidence of aggressive bacterial process   Explained the natural history of uri and why it's necessary in patients at risk to treat GERD aggressively - at least  short term -   to reduce risk of evolving cyclical cough initially  triggered by epithelial injury and a heightened sensitivty to the effects of any upper airway irritants,  most importantly acid - related - then perpetuated by epithelial injury related to the cough itself as the upper airway collapses on itself.  That is, the more sensitive the epithelium becomes once it is damaged by the virus, the more the ensuing irritability> the more the cough, the more the secondary reflux (especially in those prone to reflux) the more the irritation of the sensitive mucosa and so on in a  Classic cyclical pattern.    For now also rz zpak/ Prednisone 10 mg take  4 each am x 2 days,   2 each am x 2 days,  1 each am x 2 days and stop and prn dulera and f/u prn < 100% back to baseline in a week   See instructions for specific recommendations which were reviewed directly with the patient who was given a copy with highlighter outlining the key components.

## 2014-04-18 NOTE — Progress Notes (Signed)
Subjective:    Patient ID: Kayla [name], female    DOB: 07/26/1950   MRN: [id number]   Brief patient profile:  60  yowf never smoker with MS since Oct 96 all neuro care through Kayla [name] in [name] ([facility] of [name]),   Kayla [name] at [facility], [name],  [facility]la [name] ortho  Followed here for Primary care/hypothyroid   HPI October 04, 2009 Followup with PFT's. Pt states that her breathing is no better since last seen June 2011 for acute visit. She states she is still wheezing- ventolin helps and is using this up to tid. w/u c/w anemia  Mild dysphagia no more than usual. occ waking up wheezing at night despite zegerid at hs.   rec  Try  use dulera 100 2 bid prn   04/04/2014 Acute OV  Complains of sorethroat x 24 h,really sore,PND,no congestion,no fcs,not aware of an exposure to sickness,cough dry today  Leaving to go [place] for [employer].  rec Salt water gargles  As needed   Tylenol As needed   Fluids and rest  Claritin 10mg  daily As needed  For drainage  Strep test > neg    04/18/2014 f/u ov/Kayla [name] re: URI/ acute cough  Chief Complaint  Patient presents with  . Acute Visit    [name], congestion and runny nose x 2 wks. Cough is prod with minimal yellow sputum.    sore throat resolved then gradually worse with  more nasal and chest congestion but no sob or fever  Cough worse right at hs  But not typically awakening her while sleeping or in am  No better on mucinex / not using dulera, doesn't really feel sob  No obvious day to day or daytime variabilty or assoc   cp or chest tightness, subjective wheeze or overt  hb symptoms. No unusual exp hx or h/o childhood pna/ asthma or knowledge of premature birth.  Sleeping ok without nocturnal  or early am exacerbation  of respiratory  c/o's or need for noct saba. Also denies any obvious fluctuation of symptoms with weather or environmental changes or other aggravating or alleviating factors except as outlined above   Current  Medications, Allergies, Complete Past Medical History, Past Surgical History, Family History, and Social History were reviewed in [employer] record.    ROS  The following are not active complaints unless bolded sore throat, dysphagia, dental problems, itching, sneezing,  nasal congestion or excess/ purulent secretions, ear ache,   fever, chills, sweats, unintended wt loss, pleuritic or exertional cp, hemoptysis,  orthopnea pnd or leg swelling, presyncope, palpitations, heartburn, abdominal pain, anorexia, nausea, vomiting, diarrhea  or change in bowel or urinary habits, change in stools or urine, dysuria,hematuria,  rash, arthralgias, visual complaints, headache, numbness weakness or ataxia or problems with walking or coordination,  change in mood/affect or memory.       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  Kayla [name]  Dyspnea/wheeze ? asthma  - HFA 50 % after coaching October 04, 2009 >  50% 06/25/2010  - PFT's October 04, 2009 FEV1 2.15 (95%) with ratio 69 and DLCO 58> corrects to 98, mild insp truncation  Health Maintenance.............................................Marland KitchenWert  - Pneumovax March 28, 2009  - Td  April 10, 2009        Objective:   Physical Exam  amb  white female nad / walks with cane/ moderate hoarse with pseudowheeze  Wt  118 > 127 March 28, 2009 >129 June 17 > 128 October 04, 2009 > 129 06/25/2010 >125 10/29/2010 > 128 11/26/2010 >>127 12/10/2010 > 11/17/2011  129> 126 05/20/2012 > 07/29/2012  129 > 10/20/2012  129 >  135 06/21/2013 >  11/04/2013 135 >134  04/04/2014 > 04/18/2014 135     HEENT: nl dentition, turbinates, and orophanx. Nl external ear canals without cough reflex  Neck without JVD/Nodes/TM  Lungs clear  RRR no s3 or murmur or increase in P2  Abd soft and benign with nl excursion in the supine position. No bruits or organomegaly / no cvat Ext warm hands, sym cool feet with nl pulses and  without calf tenderness, cyanosis clubbing or edema  Skin warm and dry without lesions Neuro:  nl sensorium, walks with cane, no obvious motor deficits    CXR  11/04/2013 :  No edema or consolidation.     Recent Labs Lab 11/04/13 1102  NA 132*  K 4.4  CL 97  CO2 26  BUN 13  CREATININE 0.6  GLUCOSE 86    Recent Labs Lab 11/04/13 1102  HGB 12.4  HCT 36.2  WBC 3.2*  PLT 302.0     Lab Results  Component Value Date   TSH 4.88* 11/04/2013     Assessment & Plan:

## 2014-04-18 NOTE — Patient Instructions (Signed)
Whenever any respiratory flare > add pepcid 20 mg at bedtime until completely better for at least a week  GERD (REFLUX)  is an extremely common cause of respiratory symptoms just like yours , many times with no obvious heartburn at all.    It can be treated with medication, but also with lifestyle changes including avoidance of late meals, excessive alcohol, smoking cessation, and avoid fatty foods, chocolate, peppermint, colas, red wine, and acidic juices such as orange juice.  NO MINT OR MENTHOL PRODUCTS SO NO COUGH DROPS  USE SUGARLESS CANDY INSTEAD (Jolley ranchers or Stover's or Life Savers) or even ice chips will also do - the key is to swallow to prevent all throat clearing. NO OIL BASED VITAMINS - use powdered substitutes.  zpak / Prednisone 10 mg take  4 each am x 2 days,   2 each am x 2 days,  1 each am x 2 days and stop   For mucinex dm up to 1200 every 12 hours as needed   Please schedule a follow up visit in 3 months but call sooner if needed

## 2014-04-26 ENCOUNTER — Telehealth: Payer: Self-pay | Admitting: Internal Medicine

## 2014-04-26 MED ORDER — AMOXICILLIN-POT CLAVULANATE 875-125 MG PO TABS: 1.0000 | ORAL_TABLET | Freq: Two times a day (BID) | ORAL | Status: DC

## 2014-04-26 NOTE — Telephone Encounter (Signed)
Pt informed of Dr Gustavus Bryant recommendations. Rx sent to pharmacy.

## 2014-04-26 NOTE — Telephone Encounter (Signed)
Pt finished Zpak and pred taper that were given on 04/18/14 and still c/o headaches, sinus congestion, runny nose (clear), dry cough, occas nausea, lightheaded.  Denies fever.  Still taking Mucinex DM and Pepcid at bedtime.  Please advise.

## 2014-04-26 NOTE — Telephone Encounter (Signed)
Try Augmentin 875 mg take one pill twice daily  X 10 days - take at breakfast and supper with large glass of water.  It would help reduce the usual side effects (diarrhea and yeast infections) if you ate cultured yogurt at lunch.   F/u by end of week if not better, Tammy or me

## 2014-07-14 NOTE — Consult Note (Signed)
Note Type Consult   HPI: Referred by Kayla [name], M.D.  -Neurology.   This 64 year old Female patient presents to the clinic for initial evaluation of  Tysabri infusions for multiple sclerosis. .  Subjective: Chief Complaint/Diagnosis:   Tysabri infusions for multiple sclerosis. HPI:   Patient is a 64 year old female with a long-standing diagnosis of multiple sclerosis.  She previously received Tysabri infusions, but none for over 2 years.  She reports she tolerated them well.  Currently, she feels well and at her baseline.  She has no new neurologic complaints.  She denies any recent fevers or illnesses.  She has a good appetite and denies weight loss.  She has no chest pain or shortness of breath.  She denies any nausea, vomiting, constipation, or diarrhea.  She has no urinary complaints.  Patient offers no further specific complaints today.   Review of Systems:  Performance Status (ECOG): 1  Review of Systems:   As per HPI. Otherwise, 10 point system review was negative.   Allergies:  Dilantin: Rash  Percocet 10/325: Hallucinations  Zanaflex: Unknown  Smoking History: Smoking History Never Smoked.(1)  PFSH: Additional Past Medical and Surgical History: multiple sclerosis, hypothyroidism, GERD, total hysterectomy, bladder sling surgery.    Family history: Breast cancer, colon cancer hypothyroidism, DVT.    Social history: Patient denies tobacco use, occasional alcohol.   Home Medications: Medication Instructions Last Modified Date/Time  gabapentin 600 mg oral tablet 1 tab(s) orally 2 times a day 27-Aug-14 09:14  baclofen 20 mg oral tablet 1.5 tab(s) orally 3 times a day 27-Aug-14 09:14  FLUoxetine 40 mg oral capsule 1 cap(s) orally once a day 27-Aug-14 09:14  levothyroxine 100 mcg (0.1 mg) oral tablet 1 tab(s) orally once a day 27-Aug-14 09:14  Dexilant 60 mg oral delayed release capsule 1 cap(s) orally once a day 27-Aug-14 09:14  Ampyra 10 mg oral tablet, extended  release 1 tab(s) orally every 12 hours 27-Aug-14 09:14  temazepam 15 mg oral capsule 1 cap(s) orally once a day (at bedtime), As Needed 27-Aug-14 09:14  naproxen 500 mg oral tablet 1 tab(s) orally once a day 27-Aug-14 09:14   Vital Signs:  :: Wt(KG): 58 Temp: 96.2 Pulse: 66 RR: 16  BP: 117/54   Physical Exam:  General: well developed, well nourished, and in no acute distress  Mental Status: normal affect  Eyes: anicteric sclera  Head, Ears, Nose,Throat: Normocephalic, moist mucous membranes, clear oropharynx without erythema or thrush.  Neck, Thyroid: No palpable lymphadenopathy, thyroid midline without nodules.  Respiratory: clear to auscultation bilaterally  Cardiovascular: regular rate and rhythm, no murmur, rub, or gallop  Gastrointestinal: soft, nondistended, nontender, no organomegaly.  normal active bowel sounds  Musculoskeletal: No edema  Skin: No rash or petechiae noted  Neurological: alert, answering all questions appropriately.  Cranial nerves grossly intact   Orders: Chemotherapy Orders:   1 Natalizumab Injection                    Order date: null  300 mg, IV Piggyback, once, 115 ml/hr, 60 minute(s)  Assessment and Plan: Impression:   Tysabri infusions for multiple sclerosis. Plan:   1. Tysabri infusions for multiple sclerosis:  Proceed with 300 mg IV Tysabri today.  Patient will return to clinic every 4 weeks for continued infusions.  She will then followup in 6 months for routine evaluation.  She has been instructed that any laboratory work, including JC virus titers,  with will be monitored by her primary  neurologist.  Also, if she has any questions regarding her infusions or her multiple sclerosis these should also be directed to her neurologist.  Patient expressed understanding and was in agreement with this plan.  CC Referral:  cc: Dr. Gorden [name].   Electronic Signatures: Kayla [name] (MD)  (Signed 27-Aug-14 10:25)  Authored: Note Type, History of Present  Illness, CC/HPI, Review of Systems, ALLERGIES, Smoking Cessation, Patient Family Social History, HOME MEDICATIONS, Vital Signs, Physical Exam, Chemo Orders, Assessment and Plan, CC Referring Physician   Last Updated: 27-Aug-14 10:25 by Kayla [name] (MD)  References: 1.  Data Referenced From "[facility] Nurse Note" 17-Nov-2012 9:05 AM

## 2014-07-17 ENCOUNTER — Encounter: Payer: Self-pay | Admitting: Internal Medicine

## 2014-07-17 ENCOUNTER — Other Ambulatory Visit (INDEPENDENT_AMBULATORY_CARE_PROVIDER_SITE_OTHER): Payer: 59

## 2014-07-17 ENCOUNTER — Ambulatory Visit (INDEPENDENT_AMBULATORY_CARE_PROVIDER_SITE_OTHER): Payer: 59 | Admitting: Internal Medicine

## 2014-07-17 VITALS — BP 120/68 | HR 84 | Ht 63.0 in | Wt 132.6 lb

## 2014-07-17 DIAGNOSIS — R0600 Dyspnea, unspecified: Secondary | ICD-10-CM

## 2014-07-17 DIAGNOSIS — R1314 Dysphagia, pharyngoesophageal phase: Secondary | ICD-10-CM | POA: Diagnosis not present

## 2014-07-17 DIAGNOSIS — R339 Retention of urine, unspecified: Secondary | ICD-10-CM | POA: Diagnosis not present

## 2014-07-17 DIAGNOSIS — E038 Other specified hypothyroidism: Secondary | ICD-10-CM

## 2014-07-17 DIAGNOSIS — G35 Multiple sclerosis: Secondary | ICD-10-CM

## 2014-07-17 LAB — CBC WITH DIFFERENTIAL/PLATELET
Basophils Absolute: 0 10*3/uL (ref 0.0–0.1)
Basophils Relative: 0.4 % (ref 0.0–3.0)
Eosinophils Absolute: 0.1 10*3/uL (ref 0.0–0.7)
Eosinophils Relative: 2.3 % (ref 0.0–5.0)
HCT: 33.7 % — ABNORMAL LOW (ref 36.0–46.0)
Hemoglobin: 11.5 g/dL — ABNORMAL LOW (ref 12.0–15.0)
Lymphocytes Relative: 13.6 % (ref 12.0–46.0)
Lymphs Abs: 0.4 10*3/uL — ABNORMAL LOW (ref 0.7–4.0)
MCHC: 34.1 g/dL (ref 30.0–36.0)
MCV: 90.2 fl (ref 78.0–100.0)
Monocytes Absolute: 0.4 10*3/uL (ref 0.1–1.0)
Monocytes Relative: 14.5 % — ABNORMAL HIGH (ref 3.0–12.0)
Neutro Abs: 2.1 10*3/uL (ref 1.4–7.7)
Neutrophils Relative %: 69.2 % (ref 43.0–77.0)
Platelets: 303 10*3/uL (ref 150.0–400.0)
RBC: 3.74 Mil/uL — ABNORMAL LOW (ref 3.87–5.11)
RDW: 13.5 % (ref 11.5–15.5)
WBC: 3.1 10*3/uL — ABNORMAL LOW (ref 4.0–10.5)

## 2014-07-17 LAB — BASIC METABOLIC PANEL
BUN: 12 mg/dL (ref 6–23)
CO2: 26 mEq/L (ref 19–32)
Calcium: 9.2 mg/dL (ref 8.4–10.5)
Chloride: 99 mEq/L (ref 96–112)
Creatinine, Ser: 0.58 mg/dL (ref 0.40–1.20)
GFR: 111.97 mL/min (ref >60.00)
Glucose, Bld: 144 mg/dL — ABNORMAL HIGH (ref 70–99)
Potassium: 4.2 mEq/L (ref 3.5–5.1)
Sodium: 131 mEq/L — ABNORMAL LOW (ref 135–145)

## 2014-07-17 LAB — HEPATIC FUNCTION PANEL
ALT: 14 U/L (ref 0–35)
AST: 21 U/L (ref 0–37)
Albumin: 4.2 g/dL (ref 3.5–5.2)
Alkaline Phosphatase: 50 U/L (ref 39–117)
Bilirubin, Direct: 0.1 mg/dL (ref 0.0–0.3)
Total Bilirubin: 0.3 mg/dL (ref 0.2–1.2)
Total Protein: 6.5 g/dL (ref 6.0–8.3)

## 2014-07-17 LAB — TSH: TSH: 0.06 u[IU]/mL — ABNORMAL LOW (ref 0.35–4.50)

## 2014-07-17 LAB — BRAIN NATRIURETIC PEPTIDE: Pro B Natriuretic peptide (BNP): 71 pg/mL (ref 0.0–100.0)

## 2014-07-17 MED ORDER — TRIAMTERENE-HCTZ 37.5-25 MG PO TABS: 1.0000 | ORAL_TABLET | Freq: Every day | ORAL | Status: DC

## 2014-07-17 NOTE — Patient Instructions (Addendum)
maxzide 25 one daily unless not able to drink/ eat normally  Use salt substitute when feasible   Please remember to go to the lab  department downstairs for your tests - we will call you with the results when they are available. lated add hold thyroid x 1 weeks and then restart at 112.5 per day

## 2014-07-17 NOTE — Progress Notes (Signed)
Subjective:    Patient ID: Kayla [name], female    DOB: 1951-03-11   MRN: [id number]   Brief patient profile:  36  yowf never smoker with MS since Oct 96 all neuro care through Dr [name] in [facility]),   [name] at [facility] urology,  [place] ortho  Followed here for Primary care/hypothyroid   HPI October 04, 2009 Followup with PFT's. Pt states that her breathing is no better since last seen June 2011 for acute visit. She states she is still wheezing- ventolin helps and is using this up to tid. w/u c/w anemia  Mild dysphagia no more than usual. occ waking up wheezing at night despite zegerid at hs.   rec  Try  use dulera 100 2 bid prn   04/04/2014 Acute OV  Complains of sorethroat x 24 h,really sore,PND,no congestion,no fcs,not aware of an exposure to sickness,cough dry today  Leaving to go [place] for [employer].  rec Salt water gargles  As needed   Tylenol As needed   Fluids and rest  Claritin 10mg  daily As needed  For drainage  Strep test > neg    04/18/2014 f/u ov/Kayla [name] re: URI/ acute cough  Chief Complaint  Patient presents with  . Acute Visit    [name], congestion and runny nose x 2 wks. Cough is prod with minimal yellow sputum.   Sore throat resolved then gradually worse with  more nasal and chest congestion but no sob or fever  Cough worse right at hs  But not typically awakening her while sleeping or in am  No better on mucinex / not using dulera, doesn't really feel sob rec Whenever any respiratory flare > add pepcid 20 mg at bedtime until completely better for at least a week GERD  diet zpak / Prednisone 10 mg take  4 each am x 2 days,   2 each am x 2 days,  1 each am x 2 days and stop  For mucinex dm up to 1200 every 12 hours as needed    07/17/2014 f/u ov/Kayla [name] re: URI / cough   Chief Complaint  Patient presents with  . Follow-up    Pt denies any cough, wheezing, Sob, chest congestion/tightness. States breathing has improved. Uses  Dulera Qday C/o Edema and feet turning purple.        No obvious day to day or daytime variabilty or assoc   cp or chest tightness, subjective wheeze or overt  hb symptoms. No unusual exp hx or h/o childhood pna/ asthma or knowledge of premature birth.  Sleeping ok without nocturnal  or early am exacerbation  of respiratory  c/o's or need for noct saba. Also denies any obvious fluctuation of symptoms with weather or environmental changes or other aggravating or alleviating factors except as outlined above   Current Medications, Allergies, Complete Past Medical History, Past Surgical History, Family History, and Social History were reviewed in [employer] record.    ROS  The following are not active complaints unless bolded sore throat, dysphagia, dental problems, itching, sneezing,  nasal congestion or excess/ purulent secretions, ear ache,   fever, chills, sweats, unintended wt loss, pleuritic or exertional cp, hemoptysis,  orthopnea pnd or leg swelling, presyncope, palpitations, heartburn, abdominal pain, anorexia, nausea, vomiting, diarrhea  or change in bowel or urinary habits, change in stools or urine, dysuria,hematuria,  rash, arthralgias, visual complaints, headache, numbness weakness or ataxia or problems with walking or coordination,  change  in mood/affect or memory.       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  [name]  Dyspnea/wheeze ? asthma  - HFA 50 % after coaching October 04, 2009 >  50% 06/25/2010  - PFT's October 04, 2009 FEV1 2.15 (95%) with ratio 69  and DLCO 58> corrects to 98, mild insp truncation  Health Maintenance.............................................Marland KitchenWert  - Pneumovax March 28, 2009  - Td  April 10, 2009        Objective:   Physical Exam  amb  white female nad / walks with cane - voice better   Wt  118 > 127 March 28, 2009 >129 June 17 > 128 October 04, 2009 > 129 06/25/2010 >125 10/29/2010 > 128 11/26/2010 >>127 12/10/2010 > 11/17/2011  129> 126 05/20/2012 > 07/29/2012  129 > 10/20/2012  129 >  135 06/21/2013 >  11/04/2013 135 >134 04/04/2014 > 04/18/2014 135 >  07/17/2014 132    HEENT: nl dentition, turbinates, and orophanx. Nl external ear canals without cough reflex  Neck without JVD/Nodes/TM  Lungs clear  RRR no s3 or murmur or increase in P2  Abd soft and benign with nl excursion in the supine position. No bruits or organomegaly / no cvat Ext warm hands, sym cool feet with nl pulses and  without calf tenderness, cyanosis clubbing or edema  Skin warm and dry without lesions Neuro:  nl sensorium, walks with cane, no obvious motor deficits    CXR  11/04/2013 :  No edema or consolidation.    Labs ordered/ reviewed   Lab 07/17/14 1516  NA 131*  K 4.2  CL 99  CO2 26  BUN 12  CREATININE 0.58  GLUCOSE 144*    Recent Labs Lab 07/17/14 1516  HGB 11.5*  HCT 33.7*  WBC 3.1*  PLT 303.0     Lab Results  Component Value Date   TSH 0.06* 07/17/2014     Lab Results  Component Value Date   PROBNP 71.0 07/17/2014    Alb =  4.2          Assessment & Plan:

## 2014-07-18 ENCOUNTER — Other Ambulatory Visit: Payer: Self-pay | Admitting: Internal Medicine

## 2014-07-18 ENCOUNTER — Encounter: Payer: Self-pay | Admitting: Internal Medicine

## 2014-07-18 MED ORDER — LEVOTHYROXINE SODIUM 112 MCG PO TABS: 112.0000 ug | ORAL_TABLET | Freq: Every day | ORAL | Status: DC

## 2014-07-18 NOTE — Assessment & Plan Note (Signed)
?   If some of her complaints may be related to meds > defer to Dr Jacqulynn Cadet Tow[place] Malkin

## 2014-07-18 NOTE — Assessment & Plan Note (Signed)
ST eval 02/23/13 Moderate pharyngeal phase dysphagia;Moderate cervical esophageal phase dysphagia Clinical impression: Pt. exhibits a moderate pharyngeal and cervical esophageal dysphagia, characterized by reduced base of tongue contraction to the pharyngeal wall, reduced pharyngeal perestalsis, and reduced crichopharyngeal relaxation. These deficits resulted in moderate pharyngeal residue with solids in the vallculae and pyriform sinuses.  Improved on gerd rx despite MS >no change rx

## 2014-07-18 NOTE — Assessment & Plan Note (Signed)
Dyspnea/wheeze ? asthma  - HFA 50 % after coaching October 04, 2009 >  Better on dulera 100 > hfa  50% 06/25/2010  - PFT's October 04, 2009 FEV1 2.15 (95%) with ratio 69 and DLCO 58> corrects to 98, mild insp truncation  - 07/17/2014 p extensive coaching HFA effectiveness =    75%   Adequate control on present rx, reviewed > no change in rx needed  > dulera 100 2 bid

## 2014-07-18 NOTE — Assessment & Plan Note (Signed)
overtreated since increasing by 25 mcg for tsh around 5 so rec   Hold x 1 week  then start 12.5 mcg less and recheck in 4 weeks

## 2014-07-18 NOTE — Progress Notes (Signed)
Quick Note:  Spoke with pt and notified of results per Dr. [name]. Pt verbalized understanding and denied any questions.  ______ 

## 2014-07-20 ENCOUNTER — Other Ambulatory Visit: Payer: Self-pay | Admitting: Internal Medicine

## 2014-07-20 MED ORDER — LEVOTHYROXINE SODIUM 112 MCG PO TABS: 112.0000 ug | ORAL_TABLET | Freq: Every day | ORAL | Status: DC

## 2014-08-17 ENCOUNTER — Ambulatory Visit (INDEPENDENT_AMBULATORY_CARE_PROVIDER_SITE_OTHER): Payer: 59 | Admitting: Internal Medicine

## 2014-08-17 ENCOUNTER — Other Ambulatory Visit (INDEPENDENT_AMBULATORY_CARE_PROVIDER_SITE_OTHER): Payer: 59

## 2014-08-17 ENCOUNTER — Encounter (INDEPENDENT_AMBULATORY_CARE_PROVIDER_SITE_OTHER): Payer: Self-pay

## 2014-08-17 ENCOUNTER — Encounter: Payer: Self-pay | Admitting: Internal Medicine

## 2014-08-17 VITALS — BP 100/60 | HR 75 | Ht 63.0 in | Wt 134.6 lb

## 2014-08-17 DIAGNOSIS — J45909 Unspecified asthma, uncomplicated: Secondary | ICD-10-CM

## 2014-08-17 DIAGNOSIS — E038 Other specified hypothyroidism: Secondary | ICD-10-CM

## 2014-08-17 DIAGNOSIS — M7989 Other specified soft tissue disorders: Secondary | ICD-10-CM

## 2014-08-17 DIAGNOSIS — R0600 Dyspnea, unspecified: Secondary | ICD-10-CM | POA: Diagnosis not present

## 2014-08-17 DIAGNOSIS — E871 Hypo-osmolality and hyponatremia: Secondary | ICD-10-CM

## 2014-08-17 DIAGNOSIS — R339 Retention of urine, unspecified: Secondary | ICD-10-CM

## 2014-08-17 LAB — URINALYSIS, ROUTINE W REFLEX MICROSCOPIC
Bilirubin Urine: NEGATIVE
Hgb urine dipstick: NEGATIVE
Ketones, ur: NEGATIVE
Leukocytes, UA: NEGATIVE
Nitrite: POSITIVE — AB
RBC / HPF: NONE SEEN (ref 0–?)
Specific Gravity, Urine: 1.01 (ref 1.000–1.030)
Total Protein, Urine: NEGATIVE
Urine Glucose: NEGATIVE
Urobilinogen, UA: 0.2 (ref 0.0–1.0)
pH: 7 (ref 5.0–8.0)

## 2014-08-17 LAB — BASIC METABOLIC PANEL
BUN: 13 mg/dL (ref 6–23)
CO2: 29 mEq/L (ref 19–32)
Calcium: 9.2 mg/dL (ref 8.4–10.5)
Chloride: 91 mEq/L — ABNORMAL LOW (ref 96–112)
Creatinine, Ser: 0.74 mg/dL (ref 0.40–1.20)
GFR: 84.5 mL/min (ref >60.00)
Glucose, Bld: 96 mg/dL (ref 70–99)
Potassium: 4.2 mEq/L (ref 3.5–5.1)
Sodium: 126 mEq/L — ABNORMAL LOW (ref 135–145)

## 2014-08-17 LAB — TSH: TSH: 0.3 u[IU]/mL — ABNORMAL LOW (ref 0.35–4.50)

## 2014-08-17 NOTE — Patient Instructions (Addendum)
Try stopping the neurontin to see if what effect it has on your swelling  For swelling take one - two maxzide daily   Please remember to go to the lab department downstairs for your tests - we will call you with the results when they are available.  Please schedule a follow up visit in 3 months but call sooner if needed  Late add: due to low na d/c hctz and use prn lasix 20 mg

## 2014-08-17 NOTE — Progress Notes (Signed)
Subjective:    Patient ID: Kayla [name], female    DOB: 06-Mar-1951   MRN: [id number]   Brief patient profile:  57 yowf never smoker with MS since Oct 96 all neuro care through Dr [name] in [facility] ([facility] of [facility]),   [name] at [facility] urology,  [place] ortho  Followed here for Primary care/hypothyroid   HPI October 04, 2009 Followup with PFT's. Pt states that her breathing is no better since last seen June 2011 for acute visit. She states she is still wheezing- ventolin helps and is using this up to tid. w/u c/w anemia  Mild dysphagia no more than usual. occ waking up wheezing at night despite zegerid at hs.   rec  Try  use dulera 100 2 bid prn    07/17/2014 f/u ov/Dixie Jafri re: URI / cough   Chief Complaint  Patient presents with  . Follow-up    Pt denies any cough, wheezing, Sob, chest congestion/tightness. States breathing has improved. Uses Dulera Qday C/o Edema and feet turning purple.   rec maxzide 25 one daily unless not able to drink/ eat normally Use salt substitute when feasible  Please remember to go to the lab  department downstairs for your tests - we will call you with the results when they are available. late add hold thyroid x 1 weeks and then restart at 112.5 per day     08/17/2014 f/u ov/Nickol Collister re: hypothyroid over treated/ new foot swelling on neurontin/ rx maxzide  Chief Complaint  Patient presents with  . Follow-up    Pt states still swelling in fingers and feet. Her breathing is doing well.    Not limited by breathing from desired activities  On dulera 100 2bid   No obvious day to day or daytime variabilty or assoc   cp or chest tightness, subjective wheeze or overt  hb symptoms. No unusual exp hx or h/o childhood pna/ asthma or knowledge of premature birth.  Sleeping ok without nocturnal  or early am exacerbation  of respiratory  c/o's or need for noct saba. Also denies any obvious fluctuation of symptoms with weather or environmental changes or other  aggravating or alleviating factors except as outlined above   Current Medications, Allergies, Complete Past Medical History, Past Surgical History, Family History, and Social History were reviewed in [employer] record.    ROS  The following are not active complaints unless bolded sore throat, dysphagia, dental problems, itching, sneezing,  nasal congestion or excess/ purulent secretions, ear ache,   fever, chills, sweats, unintended wt loss, pleuritic or exertional cp, hemoptysis,  orthopnea pnd or leg swelling, presyncope, palpitations, heartburn, abdominal pain, anorexia, nausea, vomiting, diarrhea  or change in bowel or urinary habits, change in stools or urine, dysuria,hematuria,  rash, arthralgias, visual complaints, headache, numbness weakness or ataxia or problems with walking or coordination,  change in mood/affect or memory.       Past Medical History:  MULTIPLE SCLEROSIS (ICD-340)........................Marland Kitchen Dr [facility], was at [facility])  - dx 12/1994  Sleep Apnea.............................................................[name]      - CPAP rx 04/2010 HYPOTHYROIDISM (ICD-244.9)  OTHER DISEASES OF VOCAL CORDS (ICD-478.5)  Cervical Cancer > complete hysterectomy...........Marland Kitchen [name] HOARSENESS (ICD-784.49).................................  [name]  Dyspnea/wheeze ? asthma  - HFA 50 % after coaching October 04, 2009 >  50% 06/25/2010  - PFT's October 04, 2009 FEV1 2.15 (95%) with ratio 69 and DLCO 58> corrects to 98, mild insp truncation  Health Maintenance.............................................Marland KitchenWert  - Pneumovax  March 28, 2009  - Td  April 10, 2009        Objective:   Physical Exam  amb  white female nad / walks with cane - voice better   Wt  118 > 127 March 28, 2009 >129 June 17 > 128 October 04, 2009 > 129 06/25/2010 >125 10/29/2010 > 128 11/26/2010 >>127 12/10/2010 > 11/17/2011  129> 126 05/20/2012 > 07/29/2012  129 >  10/20/2012  129 >  135 06/21/2013 >  11/04/2013 135 >134 04/04/2014 > 04/18/2014 135 >  07/17/2014 132> 08/17/2014  135     HEENT: nl dentition, turbinates, and orophanx. Nl external ear canals without cough reflex  Neck without JVD/Nodes/TM  Lungs clear  RRR no s3 or murmur or increase in P2  Abd soft and benign with nl excursion in the supine position. No bruits or organomegaly / no cvat Ext warm hands, sym cool feet with nl pulses and  without calf tenderness, cyanosis clubbing - both feet with trace to 1+ Pitting/ sym  Skin warm and dry without lesions Neuro:  nl sensorium, walks with cane, no obvious motor deficits    CXR  11/04/2013 :  No edema or consolidation.        Labs ordered/ reviewed:  Lab 08/17/14 1423  NA 126*  K 4.2  CL 91*  CO2 29  BUN 13  CREATININE 0.74  GLUCOSE 96    Lab Results  Component Value Date   TSH 0.30* 08/17/2014       labs last ov:  Lab 07/17/14 1516  HGB 11.5*  HCT 33.7*  WBC 3.1*  PLT 303.0     Lab Results  Component Value Date   TSH 0.06* 07/17/2014     Lab Results  Component Value Date   PROBNP 71.0 07/17/2014    Alb =  4.2          Assessment & Plan:

## 2014-08-18 ENCOUNTER — Telehealth: Payer: Self-pay | Admitting: Internal Medicine

## 2014-08-18 MED ORDER — FUROSEMIDE 20 MG PO TABS: 20.0000 mg | ORAL_TABLET | Freq: Every day | ORAL | Status: DC

## 2014-08-18 NOTE — Telephone Encounter (Signed)
Patient notified of lab results.  Rx for Furosemide sent to pharmacy. Nothing further needed.

## 2014-08-18 NOTE — Progress Notes (Signed)
Quick Note:  LMTCB ______ 

## 2014-08-21 ENCOUNTER — Encounter: Payer: Self-pay | Admitting: Internal Medicine

## 2014-08-21 DIAGNOSIS — M7989 Other specified soft tissue disorders: Secondary | ICD-10-CM | POA: Insufficient documentation

## 2014-08-21 NOTE — Assessment & Plan Note (Signed)
Multifactorial :  Vcd/ asthma > much better on present rx

## 2014-08-21 NOTE — Assessment & Plan Note (Signed)
All goals of chronic asthma control met including optimal function and elimination of symptoms with minimal need for rescue therapy.  Contingencies discussed in full including contacting this office immediately if not controlling the symptoms using the rule of two's.    

## 2014-08-21 NOTE — Assessment & Plan Note (Signed)
Still overtreated but trending toward nl on present rx > no change needed but recheck in 6 weeks

## 2014-08-21 NOTE — Assessment & Plan Note (Signed)
W/u neg to date ? Could this be effect of neurontin > try off and just use lasx 20 mg prn as Na very sensitive to hctz  Each maintenance medication was reviewed in detail including most importantly the difference between maintenance and as needed and under what circumstances the prns are to be used.  Please see instructions for details which were reviewed in writing and the patient given a copy.

## 2014-08-21 NOTE — Assessment & Plan Note (Signed)
10/2011 132 >127 05/17/12 >130 05/20/2012 >fluid restrictions> na 132 10/20/12  - d/c'd hctz with na 126 on 08/17/14 > rx lasix   Apparently very sensitive to effects of hctz/ will avoid permanently

## 2014-09-09 ENCOUNTER — Other Ambulatory Visit: Payer: Self-pay | Admitting: Internal Medicine

## 2014-09-20 ENCOUNTER — Encounter: Payer: Self-pay | Admitting: Gastroenterology

## 2014-11-30 ENCOUNTER — Other Ambulatory Visit: Payer: Self-pay | Admitting: Internal Medicine

## 2014-12-14 ENCOUNTER — Other Ambulatory Visit: Payer: Self-pay | Admitting: Internal Medicine

## 2014-12-20 ENCOUNTER — Other Ambulatory Visit (INDEPENDENT_AMBULATORY_CARE_PROVIDER_SITE_OTHER): Payer: 59

## 2014-12-20 ENCOUNTER — Encounter: Payer: Self-pay | Admitting: Internal Medicine

## 2014-12-20 ENCOUNTER — Ambulatory Visit (INDEPENDENT_AMBULATORY_CARE_PROVIDER_SITE_OTHER): Payer: 59 | Admitting: Internal Medicine

## 2014-12-20 VITALS — BP 122/74 | HR 66 | Ht 63.0 in | Wt 132.0 lb

## 2014-12-20 DIAGNOSIS — E038 Other specified hypothyroidism: Secondary | ICD-10-CM | POA: Diagnosis not present

## 2014-12-20 DIAGNOSIS — E871 Hypo-osmolality and hyponatremia: Secondary | ICD-10-CM

## 2014-12-20 DIAGNOSIS — M7989 Other specified soft tissue disorders: Secondary | ICD-10-CM | POA: Diagnosis not present

## 2014-12-20 DIAGNOSIS — J45909 Unspecified asthma, uncomplicated: Secondary | ICD-10-CM

## 2014-12-20 LAB — BASIC METABOLIC PANEL
BUN: 24 mg/dL — ABNORMAL HIGH (ref 6–23)
CO2: 32 mEq/L (ref 19–32)
Calcium: 10 mg/dL (ref 8.4–10.5)
Chloride: 97 mEq/L (ref 96–112)
Creatinine, Ser: 0.69 mg/dL (ref 0.40–1.20)
GFR: 91.51 mL/min (ref >60.00)
Glucose, Bld: 95 mg/dL (ref 70–99)
Potassium: 4.3 mEq/L (ref 3.5–5.1)
Sodium: 135 mEq/L (ref 135–145)

## 2014-12-20 LAB — TSH: TSH: 0.14 u[IU]/mL — ABNORMAL LOW (ref 0.35–4.50)

## 2014-12-20 NOTE — Progress Notes (Signed)
Subjective:    Patient ID: Kayla [name], female    DOB: 1950/09/03   MRN: [id number]   Brief patient profile:  62 yowf never smoker with MS since Oct 96 all neuro care through Kayla [name] in [facility] ([facility] of [facility]),   Kayla [name] at [facility] urology,  [name]  Followed here for Primary care/hypothyroid     History of Present Illness  October 04, 2009 Followup with PFT's. Pt states that her breathing is no better since last seen June 2011 for acute visit. She states she is still wheezing- ventolin helps and is using this up to tid. w/u c/w anemia  Mild dysphagia no more than usual. occ waking up wheezing at night despite zegerid at hs.   rec  Try  use dulera 100 2 bid prn    07/17/2014 f/u ov/Kayla [name] re: URI / cough   Chief Complaint  Patient presents with  . Follow-up    Pt denies any cough, wheezing, Sob, chest congestion/tightness. States breathing has improved. Uses Dulera Qday C/o Edema and feet turning purple.   rec maxzide 25 one daily unless not able to drink/ eat normally Use salt substitute when feasible  Please remember to go to the lab  department downstairs for your tests - we will call you with the results when they are available. late add hold thyroid x 1 weeks and then restart at 112.5 per day     08/17/2014 f/u ov/Kayla [name] re: hypothyroid over treated/ new foot swelling on neurontin/ rx maxzide  Chief Complaint  Patient presents with  . Follow-up    Pt states still swelling in fingers and feet. Her breathing is doing well.    Not limited by breathing from desired activities  On dulera 100 2bid  rec Try stopping the neurontin to see if what effect it has on your swelling> did not do  For swelling take one - two maxzide daily   Late add: due to low na d/c hctz and use prn lasix 20 mg    12/20/2014  f/u ov/Kayla [name] re: hbp/ leg swelling / hypothyroid Chief Complaint  Patient presents with  . Follow-up    Pt states her brewathing is doing well and she denies any new  co's today.     Did not try off the Neurontin as recommended on a trial basis because she was afraid her multiple sclerosis would flare and did not discuss this yet with her neurologist in [place].  Not limited by breathing from desired activities  / no tremors   No obvious day to day or daytime variabilty or assoc   cp or chest tightness, subjective wheeze or overt  hb symptoms. No unusual exp hx or h/o childhood pna/ asthma or knowledge of premature birth.  Sleeping ok without nocturnal  or early am exacerbation  of respiratory  c/o's or need for noct saba. Also denies any obvious fluctuation of symptoms with weather or environmental changes or other aggravating or alleviating factors except as outlined above   Current Medications, Allergies, Complete Past Medical History, Past Surgical History, Family History, and Social History were reviewed in [employer] record.    ROS  The following are not active complaints unless bolded sore throat, dysphagia, dental problems, itching, sneezing,  nasal congestion or excess/ purulent secretions, ear ache,   fever, chills, sweats, unintended wt loss, pleuritic or exertional cp, hemoptysis,  orthopnea pnd or leg swelling about the same since changed hctz to lasix due to low na,  presyncope, palpitations, heartburn, abdominal pain, anorexia, nausea, vomiting, diarrhea  or change in bowel or urinary habits, change in stools or urine, dysuria,hematuria,  rash, arthralgias, visual complaints, headache, numbness weakness or ataxia or problems with walking uses cane  or coordination,  change in mood/affect or memory.       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  Kayla [name]  Dyspnea/wheeze ? asthma  - HFA 50 % after coaching October 04, 2009 >  50% 06/25/2010  - PFT's October 04, 2009 FEV1 2.15 (95%) with ratio 69 and DLCO 58> corrects to 98, mild insp truncation  Health Maintenance.............................................Marland KitchenWert  - Pneumovax March 28, 2009  - Td  April 10, 2009        Objective:   Physical Exam  amb  white female nad / walks with cane - voice better   Wt  118 > 127 March 28, 2009 >129 June 17 > 128 October 04, 2009 > 129 06/25/2010 >125 10/29/2010 > 128 11/26/2010 >>127 12/10/2010 > 11/17/2011  129> 126 05/20/2012 > 07/29/2012  129 > 10/20/2012  129 >  135 06/21/2013 >  11/04/2013 135 >134 04/04/2014 > 04/18/2014 135 >  07/17/2014 132> 08/17/2014  135 > 12/20/2014     HEENT: nl dentition, turbinates, and orophanx. Nl external ear canals without cough reflex  Neck without JVD/Nodes/TM  Lungs clear  RRR no s3 or murmur or increase in P2  Abd soft and benign with nl excursion in the supine position. No bruits or organomegaly / no cvat Ext warm hands, sym cool feet with nl pulses and  without calf tenderness, cyanosis clubbing - both feet with trace to 1+ Pitting/ sym  Skin warm and dry without lesions Neuro:  nl sensorium, walks with cane, no obvious motor deficits   Labs ordered/ reviewed:   Lab 12/20/14 1445  NA 135  K 4.3  CL 97  CO2 32  BUN 24*  CREATININE 0.69  GLUCOSE 95      Lab Results  Component Value Date   TSH 0.14* 12/20/2014                  Assessment & Plan:

## 2014-12-20 NOTE — Patient Instructions (Addendum)
Call your GI doctor if constipation not resolved  Please remember to go to the lab  department downstairs for your tests - we will call you with the results when they are available.  Please schedule a follow up visit in 3 months but call sooner if needed   late add change synthroid to 100 mcg per day

## 2014-12-21 ENCOUNTER — Other Ambulatory Visit: Payer: Self-pay | Admitting: Internal Medicine

## 2014-12-21 MED ORDER — LEVOTHYROXINE SODIUM 50 MCG PO TABS: 50.0000 ug | ORAL_TABLET | Freq: Every day | ORAL | Status: DC

## 2014-12-21 NOTE — Telephone Encounter (Signed)
Notes Recorded by Tanda Rockers, MD on 12/20/2014 at 5:15 PM Call patient : Study is c/w too much thyroid so reduce dose to 50 mcg per day and recheck one month p change  Pt rx was sent into [place]  Pt aware and nothing further needed

## 2014-12-21 NOTE — Assessment & Plan Note (Signed)
-   try off neurontin 08/17/14 > did not do > rec that she discuss this with her neurologist 12/20/2014   To my knowledge stopping Neurontin would not make her multiple sclerosis flare but rather may flare some of the symptoms from it mainly paresthesias - I have no other recommendations for her leg swelling at this point so it's worth considering again.

## 2014-12-21 NOTE — Progress Notes (Signed)
Quick Note:  Called and spoke with pt. Reviewed results and recs. Pt voiced understanding and had no further questions. Rx was sent to [facility] for levothyroxine 76mcg. ______

## 2014-12-21 NOTE — Assessment & Plan Note (Signed)
Lab Results  Component Value Date   TSH 0.14* 12/20/2014    Presently reports taking 112 g per day she'll needs to reduce the dose to 100 g per day and recheck in 4-6 weeks.

## 2014-12-21 NOTE — Assessment & Plan Note (Signed)
-  HFA 50 % after coaching October 04, 2009 >  Better on dulera 100 > hfa  50% 06/25/2010  - PFT's October 04, 2009 FEV1 2.15 (95%) with ratio 69 and DLCO 58> corrects to 98, mild insp truncation  - 07/17/2014 p extensive coaching HFA effectiveness =    75%   All goals of chronic asthma control met including optimal function and elimination of symptoms with minimal need for rescue therapy.  Contingencies discussed in full including contacting this office immediately if not controlling the symptoms using the rule of two's.

## 2014-12-21 NOTE — Assessment & Plan Note (Signed)
10/2011 132 >127 05/17/12 >130 05/20/2012 >fluid restrictions> na 132 10/20/12  - d/c'd hctz with na 126 on 08/17/14 > rx lasix > resolved 12/20/2014

## 2014-12-22 ENCOUNTER — Telehealth: Payer: Self-pay | Admitting: *Deleted

## 2014-12-22 MED ORDER — LEVOTHYROXINE SODIUM 100 MCG PO TABS: 100.0000 ug | ORAL_TABLET | Freq: Every day | ORAL | Status: DC

## 2014-12-22 NOTE — Telephone Encounter (Signed)
Spoke with the pt and notified of recs per MW  She verbalized understanding  Will take 2 50 mg tabs each am and then switch to 100 mcg  I have called in rx

## 2014-12-22 NOTE — Telephone Encounter (Signed)
-----   Message from Tanda Rockers, MD sent at 12/21/2014  7:03 AM EDT ----- change synthroid to 100 mcg per day - if we already called in the 50s and she can use 2 of them daily until she uses them up and then switched next month to the 100 strength

## 2015-01-17 ENCOUNTER — Encounter: Payer: Self-pay | Admitting: Internal Medicine

## 2015-01-17 ENCOUNTER — Ambulatory Visit (INDEPENDENT_AMBULATORY_CARE_PROVIDER_SITE_OTHER): Payer: 59 | Admitting: Internal Medicine

## 2015-01-17 ENCOUNTER — Ambulatory Visit: Payer: 59 | Admitting: Internal Medicine

## 2015-01-17 ENCOUNTER — Other Ambulatory Visit (INDEPENDENT_AMBULATORY_CARE_PROVIDER_SITE_OTHER): Payer: 59

## 2015-01-17 DIAGNOSIS — E038 Other specified hypothyroidism: Secondary | ICD-10-CM

## 2015-01-17 LAB — TSH: TSH: 0.22 u[IU]/mL — ABNORMAL LOW (ref 0.35–4.50)

## 2015-01-17 NOTE — Patient Instructions (Signed)
Please remember to go to the lab department downstairs for your tests - we will call you with the results when they are available.

## 2015-01-17 NOTE — Progress Notes (Signed)
Subjective:    Patient ID: Kayla [name], female    DOB: [birth year]   MRN: [id number]   Brief patient profile:  30 yowf never smoker with MS since Oct 96 all neuro care through Dr [name] in [facility]),   [name] at [facility],  [facility]  Followed here for Primary care/hypothyroid     History of Present Illness  October 04, 2009 Followup with PFT's. Pt states that her breathing is no better since last seen June 2011 for acute visit. She states she is still wheezing- ventolin helps and is using this up to tid. w/u c/w anemia  Mild dysphagia no more than usual. occ waking up wheezing at night despite zegerid at hs.   rec  Try  use dulera 100 2 bid prn    07/17/2014 f/u ov/[name] re: URI / cough   Chief Complaint  Patient presents with  . Follow-up    Pt denies any cough, wheezing, Sob, chest congestion/tightness. States breathing has improved. Uses Dulera Qday C/o Edema and feet turning purple.   rec maxzide 25 one daily unless not able to drink/ eat normally Use salt substitute when feasible  Please remember to go to the lab  department downstairs for your tests - we will call you with the results when they are available. late add hold thyroid x 1 weeks and then restart at 112.5 per day     08/17/2014 f/u ov/[name] re: hypothyroid over treated/ new foot swelling on neurontin/ rx maxzide  Chief Complaint  Patient presents with  . Follow-up    Pt states still swelling in fingers and feet. Her breathing is doing well.    Not limited by breathing from desired activities  On dulera 100 2bid  rec Try stopping the neurontin to see if what effect it has on your swelling> did not do  For swelling take one - two maxzide daily   Late add: due to low na d/c hctz and use prn lasix 20 mg    12/20/2014  f/u ov/[name] re: hbp/ leg swelling / hypothyroid Chief Complaint  Patient presents with  . Follow-up    Pt states her brewathing is doing well and she denies any new  co's today.     Did not try off the Neurontin as recommended on a trial basis because she was afraid her multiple sclerosis would flare and did not discuss this yet with her neurologist in [place].  Not limited by breathing from desired activities  / no tremors  rec Call your GI doctor if constipation not resolved Please schedule a follow up visit in 3 months but call sooner if needed   late add change synthroid to 100 mcg per day     01/17/2015  f/u ov/[name] re: hypothyroid Chief Complaint  Patient presents with  . Follow-up    Here for TSH receck. She c/o fatigue x 2 wks.   energy down x 2 weeks since reduced dose , no excess daytime drowsiness    No obvious day to day or daytime variabilty or assoc   cp or chest tightness, subjective wheeze or overt  hb symptoms. No unusual exp hx or h/o childhood pna/ asthma or knowledge of premature birth.  Sleeping ok without nocturnal  or early am exacerbation  of respiratory  c/o's or need for noct saba. Also denies any obvious fluctuation of symptoms with weather or environmental changes or other aggravating or alleviating factors except as outlined above  Current Medications, Allergies, Complete Past Medical History, Past Surgical History, Family History, and Social History were reviewed in [employer] record.    ROS  The following are not active complaints unless bolded sore throat, dysphagia, dental problems, itching, sneezing,  nasal congestion or excess/ purulent secretions, ear ache,   fever, chills, sweats, unintended wt loss, pleuritic or exertional cp, hemoptysis,  orthopnea pnd or leg swelling about the same since changed hctz to lasix due to low na, presyncope, palpitations, heartburn, abdominal pain, anorexia, nausea, vomiting, diarrhea  or change in bowel or urinary habits, change in stools or urine, dysuria,hematuria,  rash, arthralgias, visual complaints, headache, numbness weakness or ataxia or problems with  walking uses cane  or coordination,  change in mood/affect or memory.       Past Medical History:  MULTIPLE SCLEROSIS (ICD-340)........................Marland Kitchen Dr Effie Sh[facility], was at [facility])  - dx 12/1994  Sleep Apnea.............................................................[name]      - CPAP rx 04/2010 HYPOTHYROIDISM (ICD-244.9)  OTHER DISEASES OF VOCAL CORDS (ICD-478.5)  Cervical Cancer > complete hysterectomy...........Marland Kitchen [name] (ICD-784.49).................................  [name]  Dyspnea/wheeze ? asthma  - HFA 50 % after coaching October 04, 2009 >  50% 06/25/2010  - PFT's October 04, 2009 FEV1 2.15 (95%) with ratio 69 and DLCO 58> corrects to 98, mild insp truncation  Health Maintenance.............................................Marland KitchenWert  - Pneumovax March 28, 2009  - Td  April 10, 2009        Objective:   Physical Exam  amb  white female nad / walks with cane -   Wt  118 > 127 March 28, 2009 >129 June 17 > 128 October 04, 2009 > 129 06/25/2010 >125 10/29/2010 > 128 11/26/2010 >>127 12/10/2010 > 11/17/2011  129> 126 05/20/2012 > 07/29/2012  129 > 10/20/2012  129 >  135 06/21/2013 >  11/04/2013 135 >134 04/04/2014 > 04/18/2014 135 >  07/17/2014 132> 08/17/2014  135 > 12/20/2014 > 01/17/2015 134    HEENT: nl dentition, turbinates, and orophanx. Nl external ear canals without cough reflex  Neck without JVD/Nodes/TM  Lungs clear  RRR no s3 or murmur or increase in P2  Abd soft and benign with nl excursion in the supine position. No bruits or organomegaly / no cvat Ext warm hands, sym cool feet with nl pulses and  without calf tenderness, cyanosis clubbing - both feet with trace Pitting/ sym edema Skin warm and dry without lesions Neuro:  nl sensorium, walks with cane, no obvious motor deficits / nl dtrs              Labs drawn 01/17/2015 TSH            Assessment & Plan:

## 2015-01-18 NOTE — Progress Notes (Signed)
Quick Note:  Spoke with pt and notified of results per Dr. [name]. Pt verbalized understanding and denied any questions.  ______ 

## 2015-01-18 NOTE — Assessment & Plan Note (Signed)
Lab Results  Component Value Date   TSH 0.22* 01/17/2015    Since she is definitely feeling more fatigued and the TSH is up from previous values and I will leave her on 100 g per day but technically the TSH is still excessively suppressed

## 2015-01-20 ENCOUNTER — Other Ambulatory Visit: Payer: Self-pay | Admitting: Internal Medicine

## 2015-02-05 ENCOUNTER — Encounter: Payer: Self-pay | Admitting: Gastroenterology

## 2015-03-06 ENCOUNTER — Encounter: Payer: Self-pay | Admitting: Internal Medicine

## 2015-03-06 ENCOUNTER — Other Ambulatory Visit: Payer: Self-pay | Admitting: Internal Medicine

## 2015-03-21 ENCOUNTER — Ambulatory Visit: Payer: 59 | Admitting: Internal Medicine

## 2015-03-23 ENCOUNTER — Ambulatory Visit (AMBULATORY_SURGERY_CENTER): Payer: Self-pay | Admitting: *Deleted

## 2015-03-23 VITALS — Ht 63.0 in | Wt 134.0 lb

## 2015-03-23 DIAGNOSIS — Z800 Family history of malignant neoplasm of digestive organs: Secondary | ICD-10-CM

## 2015-03-23 NOTE — Progress Notes (Signed)
Denies allergies to eggs or soy products. Denies complications with sedation or anesthesia. Denies O2 use. Denies use of diet or weight loss medications.  Emmi instructions given for colonoscopy.  Changed prep to Miralax as suprep states it is contraindicated for pts allergic to oxycodone.

## 2015-04-02 ENCOUNTER — Encounter: Payer: 59 | Admitting: Gastroenterology

## 2015-04-12 ENCOUNTER — Encounter: Payer: 59 | Admitting: Gastroenterology

## 2015-04-26 ENCOUNTER — Encounter: Payer: 59 | Admitting: Gastroenterology

## 2015-05-21 ENCOUNTER — Encounter: Payer: Self-pay | Admitting: *Deleted

## 2015-06-06 ENCOUNTER — Other Ambulatory Visit: Payer: Self-pay | Admitting: Internal Medicine

## 2015-06-21 ENCOUNTER — Encounter: Payer: Self-pay | Admitting: Gastroenterology

## 2015-06-21 ENCOUNTER — Ambulatory Visit (AMBULATORY_SURGERY_CENTER): Payer: 59 | Admitting: Gastroenterology

## 2015-06-21 VITALS — BP 135/66 | HR 58 | Temp 97.5°F | Resp 10 | Ht 63.0 in | Wt 134.0 lb

## 2015-06-21 DIAGNOSIS — Z1211 Encounter for screening for malignant neoplasm of colon: Secondary | ICD-10-CM

## 2015-06-21 DIAGNOSIS — Z800 Family history of malignant neoplasm of digestive organs: Secondary | ICD-10-CM | POA: Diagnosis not present

## 2015-06-21 MED ORDER — SODIUM CHLORIDE 0.9 % IV SOLN: 500.0000 mL | INTRAVENOUS | Status: DC

## 2015-06-21 NOTE — Op Note (Signed)
[place] Patient Name: Kayla [name] Procedure Date: 06/21/2015 7:14 AM MRN: [id number] Endoscopist: Ladene Artist , MD Age: 65 Referring MD:  Date of Birth: 08/14/1950 Gender: Female Procedure:                Colonoscopy Indications:              Screening in patient at increased risk: Family                            history of 1st-degree relative with colorectal                            cancer Medicines:                Monitored Anesthesia Care Procedure:                Pre-Anesthesia Assessment:                           - Prior to the procedure, a History and Physical                            was performed, and patient medications and                            allergies were reviewed. [name] tolerance of                            previous anesthesia was also reviewed. The risks                            and benefits of the procedure and the sedation                            options and risks were discussed with the patient.                            All questions were answered, and informed consent                            was obtained. Prior Anticoagulants: The patient has                            taken no previous anticoagulant or antiplatelet                            agents. ASA Grade Assessment: II - A patient with                            mild systemic disease. After reviewing the risks                            and benefits, the patient was deemed in  satisfactory condition to undergo the procedure.                           After obtaining informed consent, the colonoscope                            was passed under direct vision. Throughout the                            procedure, the patient's blood pressure, pulse, and                            oxygen saturations were monitored continuously. The                            Model PCF-H190L [id number]) scope was introduced   through the anus and advanced to the the cecum,                            identified by appendiceal orifice and ileocecal                            valve. The colonoscopy was performed without                            difficulty. The patient tolerated the procedure                            well. The quality of the bowel preparation was                            adequate after extensive rinsing and suctioning.                            The ileocecal valve, appendiceal orifice, and                            rectum were photographed. Scope In: 8:10:16 AM Scope Out: 8:27:32 AM Scope Withdrawal Time: 0 hours 10 minutes 56 seconds  Total Procedure Duration: 0 hours 17 minutes 16 seconds  Findings:      The digital rectal exam was normal.      The colon (entire examined portion) appeared normal.      The retroflexed view of the distal rectum and anal verge was normal and       showed no anal or rectal abnormalities. Complications:            No immediate complications. Estimated Blood Loss:     Estimated blood loss: none. Impression:               - The entire examined colon is normal.                           - No specimens collected. Recommendation:           - Patient has a contact number available for  emergencies. The signs and symptoms of potential                            delayed complications were discussed with the                            patient. Return to normal activities tomorrow.                            Written discharge instructions were provided to the                            patient.                           - Resume previous diet.                           - Continue present medications.                           - Repeat colonoscopy in 5 years for screening                            purposes with a more extensive bowel prep. Procedure Code(s):        --- Professional ---                           [id number], Colorectal cancer  screening; colonoscopy on                            individual at high risk CPT copyright 2016 American Medical Association. All rights reserved. Ladene Artist, MD 06/21/2015 8:32:27 AM This report has been signed electronically. Number of Addenda: 0 Referring MD:      Christena Deem. Melvyn Novas, MD

## 2015-06-21 NOTE — Patient Instructions (Signed)
YOU HAD AN ENDOSCOPIC PROCEDURE TODAY AT [facility]:   Refer to the procedure report that was given to you for any specific questions about what was found during the examination.  If the procedure report does not answer your questions, please call your gastroenterologist to clarify.  If you requested that your care partner not be given the details of your procedure findings, then the procedure report has been included in a sealed envelope for you to review at your convenience later.  YOU SHOULD EXPECT: Some feelings of bloating in the abdomen. Passage of more gas than usual.  Walking can help get rid of the air that was put into your GI tract during the procedure and reduce the bloating. If you had a lower endoscopy (such as a colonoscopy or flexible sigmoidoscopy) you may notice spotting of blood in your stool or on the toilet paper. If you underwent a bowel prep for your procedure, you may not have a normal bowel movement for a few days.  Please Note:  You might notice some irritation and congestion in your nose or some drainage.  This is from the oxygen used during your procedure.  There is no need for concern and it should clear up in a day or so.  SYMPTOMS TO REPORT IMMEDIATELY:   Following lower endoscopy (colonoscopy or flexible sigmoidoscopy):  Excessive amounts of blood in the stool  Significant tenderness or worsening of abdominal pains  Swelling of the abdomen that is new, acute  Fever of 100F or higher   For urgent or emergent issues, a gastroenterologist can be reached at any hour by calling [phone].   DIET: Your first meal following the procedure should be a small meal and then it is ok to progress to your normal diet. Heavy or fried foods are harder to digest and may make you feel nauseous or bloated.  Likewise, meals heavy in dairy and vegetables can increase bloating.  Drink plenty of fluids but you should avoid alcoholic beverages for 24 hours. Drink  plenty of water.  ACTIVITY:  You should plan to take it easy for the rest of today and you should NOT DRIVE or use heavy machinery until tomorrow (because of the sedation medicines used during the test).    FOLLOW UP: Our staff will call the number listed on your records the next business day following your procedure to check on you and address any questions or concerns that you may have regarding the information given to you following your procedure. If we do not reach you, we will leave a message.  However, if you are feeling well and you are not experiencing any problems, there is no need to return our call.  We will assume that you have returned to your regular daily activities without incident.  If any biopsies were taken you will be contacted by phone or by letter within the next 1-3 weeks.  Please call us at [phone] if you have not heard about the biopsies in 3 weeks.    SIGNATURES/CONFIDENTIALITY: You and/or your care partner have signed paperwork which will be entered into your electronic medical record.  These signatures attest to the fact that that the information above on your After Visit Summary has been reviewed and is understood.  Full responsibility of the confidentiality of this discharge information lies with you and/or your care-partner.  Thank-you for choosing [place] for your healthcare needs today.

## 2015-06-21 NOTE — Progress Notes (Signed)
A/ox3 pleased with MAC, report to Suzanne RN 

## 2015-06-22 ENCOUNTER — Telehealth: Payer: Self-pay | Admitting: *Deleted

## 2015-06-22 NOTE — Telephone Encounter (Signed)
  Follow up Call-  Call back number 06/21/2015  Post procedure Call Back phone  # [phone]  Permission to leave phone message Yes     Patient questions:  Do you have a fever, pain , or abdominal swelling? No. Pain Score  0 *  Have you tolerated food without any problems? Yes.    Have you been able to return to your normal activities? Yes.    Do you have any questions about your discharge instructions: Diet   No. Medications  No. Follow up visit  No.  Do you have questions or concerns about your Care? No.  Actions: * If pain score is 4 or above: No action needed, pain <4.

## 2015-06-25 ENCOUNTER — Other Ambulatory Visit: Payer: Self-pay | Admitting: Psychiatry

## 2015-06-25 DIAGNOSIS — G35 Multiple sclerosis: Secondary | ICD-10-CM

## 2015-06-26 ENCOUNTER — Ambulatory Visit
Admission: RE | Admit: 2015-06-26 | Discharge: 2015-06-26 | Disposition: A | Discharge status: Home / Self Care | Payer: 59 | Source: Ambulatory Visit | Attending: Psychiatry | Admitting: Psychiatry

## 2015-06-26 DIAGNOSIS — G35 Multiple sclerosis: Secondary | ICD-10-CM

## 2015-06-26 MED ORDER — GADOBENATE DIMEGLUMINE 529 MG/ML IV SOLN
11.0000 mL | Freq: Once | INTRAVENOUS | Status: AC | PRN
  Administered 2015-06-26: 11 mL via INTRAVENOUS

## 2015-07-02 ENCOUNTER — Telehealth: Payer: Self-pay | Admitting: Internal Medicine

## 2015-07-02 NOTE — Telephone Encounter (Signed)
Pt returned call - [phone]

## 2015-07-02 NOTE — Telephone Encounter (Signed)
Spoke with pt. She apologized for the call, we are not the ones who fill this rx. Nothing further was needed.

## 2015-07-02 NOTE — Telephone Encounter (Signed)
lmtcb x1 for pt. 

## 2015-07-09 ENCOUNTER — Other Ambulatory Visit: Payer: Self-pay | Admitting: Internal Medicine

## 2015-08-06 ENCOUNTER — Other Ambulatory Visit: Payer: Self-pay | Admitting: Internal Medicine

## 2015-08-08 ENCOUNTER — Ambulatory Visit (INDEPENDENT_AMBULATORY_CARE_PROVIDER_SITE_OTHER): Payer: 59 | Admitting: Internal Medicine

## 2015-08-08 ENCOUNTER — Encounter: Payer: Self-pay | Admitting: Internal Medicine

## 2015-08-08 ENCOUNTER — Other Ambulatory Visit (INDEPENDENT_AMBULATORY_CARE_PROVIDER_SITE_OTHER): Payer: 59

## 2015-08-08 VITALS — BP 126/72 | HR 79 | Ht 63.0 in | Wt 126.0 lb

## 2015-08-08 DIAGNOSIS — E038 Other specified hypothyroidism: Secondary | ICD-10-CM | POA: Diagnosis not present

## 2015-08-08 DIAGNOSIS — J45909 Unspecified asthma, uncomplicated: Secondary | ICD-10-CM | POA: Diagnosis not present

## 2015-08-08 DIAGNOSIS — E871 Hypo-osmolality and hyponatremia: Secondary | ICD-10-CM

## 2015-08-08 DIAGNOSIS — M7989 Other specified soft tissue disorders: Secondary | ICD-10-CM

## 2015-08-08 LAB — BASIC METABOLIC PANEL
BUN: 24 mg/dL — ABNORMAL HIGH (ref 6–23)
CO2: 30 mEq/L (ref 19–32)
Calcium: 9.7 mg/dL (ref 8.4–10.5)
Chloride: 98 mEq/L (ref 96–112)
Creatinine, Ser: 0.77 mg/dL (ref 0.40–1.20)
GFR: 80.46 mL/min (ref >60.00)
Glucose, Bld: 89 mg/dL (ref 70–99)
Potassium: 3.8 mEq/L (ref 3.5–5.1)
Sodium: 134 mEq/L — ABNORMAL LOW (ref 135–145)

## 2015-08-08 LAB — CBC WITH DIFFERENTIAL/PLATELET
Basophils Absolute: 0 10*3/uL (ref 0.0–0.1)
Basophils Relative: 0.5 % (ref 0.0–3.0)
Eosinophils Absolute: 0.1 10*3/uL (ref 0.0–0.7)
Eosinophils Relative: 1.7 % (ref 0.0–5.0)
HCT: 36.5 % (ref 36.0–46.0)
Hemoglobin: 12.4 g/dL (ref 12.0–15.0)
Lymphocytes Relative: 25.2 % (ref 12.0–46.0)
Lymphs Abs: 1.6 10*3/uL (ref 0.7–4.0)
MCHC: 34.1 g/dL (ref 30.0–36.0)
MCV: 90.3 fl (ref 78.0–100.0)
Monocytes Absolute: 0.6 10*3/uL (ref 0.1–1.0)
Monocytes Relative: 9.1 % (ref 3.0–12.0)
Neutro Abs: 4.1 10*3/uL (ref 1.4–7.7)
Neutrophils Relative %: 63.5 % (ref 43.0–77.0)
Platelets: 362 10*3/uL (ref 150.0–400.0)
RBC: 4.04 Mil/uL (ref 3.87–5.11)
RDW: 13 % (ref 11.5–15.5)
WBC: 6.4 10*3/uL (ref 4.0–10.5)

## 2015-08-08 LAB — HEPATIC FUNCTION PANEL
ALT: 14 U/L (ref 0–35)
AST: 20 U/L (ref 0–37)
Albumin: 4.4 g/dL (ref 3.5–5.2)
Alkaline Phosphatase: 55 U/L (ref 39–117)
Bilirubin, Direct: 0.1 mg/dL (ref 0.0–0.3)
Total Bilirubin: 0.3 mg/dL (ref 0.2–1.2)
Total Protein: 6.6 g/dL (ref 6.0–8.3)

## 2015-08-08 LAB — TSH: TSH: 0.36 u[IU]/mL (ref 0.35–4.50)

## 2015-08-08 LAB — BRAIN NATRIURETIC PEPTIDE: Pro B Natriuretic peptide (BNP): 40 pg/mL (ref 0.0–100.0)

## 2015-08-08 NOTE — Progress Notes (Signed)
Subjective:    Patient ID: Kayla [name], female    DOB: 1950/04/24   MRN: [id number]   Brief patient profile:  51 yowf never smoker with MS since Oct 96 all neuro care through Dr [name] in [facility] ([facility] of [facility]),   [name] at [facility] urology,  [place] [facility]  Followed here for Primary care/hypothyroid     History of Present Illness  October 04, 2009 Followup with PFT's. Pt states that her breathing is no better since last seen June 2011 for acute visit. She states she is still wheezing- ventolin helps and is using this up to tid. w/u c/w anemia  Mild dysphagia no more than usual. occ waking up wheezing at night despite zegerid at hs.   rec  Try  use dulera 100 2 bid prn    07/17/2014 f/u ov/Kayla [name] re: URI / cough   Chief Complaint  Patient presents with  . Follow-up    Pt denies any cough, wheezing, Sob, chest congestion/tightness. States breathing has improved. Uses Dulera Qday C/o Edema and feet turning purple.   rec maxzide 25 one daily unless not able to drink/ eat normally Use salt substitute when feasible  Please remember to go to the lab  department downstairs for your tests - we will call you with the results when they are available. late add hold thyroid x 1 weeks and then restart at 112.5 per day     08/17/2014 f/u ov/Kayla [name] re: hypothyroid over treated/ new foot swelling on neurontin/ rx maxzide  Chief Complaint  Patient presents with  . Follow-up    Pt states still swelling in fingers and feet. Her breathing is doing well.    Not limited by breathing from desired activities  On dulera 100 2bid  rec Try stopping the neurontin to see if what effect it has on your swelling> did not do  For swelling take one - two maxzide daily   Late add: due to low na d/c hctz and use prn lasix 20 mg    12/20/2014  f/u ov/Kayla [name] re: hbp/ leg swelling / hypothyroid Chief Complaint  Patient presents with  . Follow-up    Pt states her brewathing is doing well and she denies any new  co's today.     Did not try off the Neurontin as recommended on a trial basis because she was afraid her multiple sclerosis would flare and did not discuss this yet with her neurologist in [place].  Not limited by breathing from desired activities  / no tremors  rec Call your GI doctor if constipation not resolved Please schedule a follow up visit in 3 months but call sooner if needed   late add change synthroid to 100 mcg per day     01/17/2015  f/u ov/Kayla [name] re: hypothyroid Chief Complaint  Patient presents with  . Follow-up    Here for TSH receck. She c/o fatigue x 2 wks.   energy down x 2 weeks since reduced dose , no excess daytime drowsiness  rec No change rx   08/08/2015  f/u ov/Kayla [name] re:  Hypothyroid/ edema / asthma on dulera 100 2bid  Chief Complaint  Patient presents with  . Acute Visit    Pt states her feet feel cold all of the time and turn purple. She states this has been going on for a long while.   purplish color feet when dependent  Resolves elevated x ? A year but some worse since onset    No obvious day  to day or daytime variabilty or assoc sob or cough or   cp or chest tightness, subjective wheeze or overt  hb symptoms. No unusual exp hx or h/o childhood pna/ asthma or knowledge of premature birth.  Sleeping ok without nocturnal  or early am exacerbation  of respiratory  c/o's or need for noct saba. Also denies any obvious fluctuation of symptoms with weather or environmental changes or other aggravating or alleviating factors except as outlined above   Current Medications, Allergies, Complete Past Medical History, Past Surgical History, Family History, and Social History were reviewed in [employer] record.    ROS  The following are not active complaints unless bolded sore throat, dysphagia, dental problems, itching, sneezing,  nasal congestion or excess/ purulent secretions, ear ache,   fever, chills, sweats, unintended wt loss, pleuritic or  exertional cp, hemoptysis,  orthopnea pnd or leg swelling about the same since changed hctz to lasix due to low na, presyncope, palpitations, heartburn, abdominal pain, anorexia, nausea, vomiting, diarrhea  or change in bowel or urinary habits, change in stools or urine, dysuria,hematuria,  rash, arthralgias, visual complaints, headache, numbness weakness or ataxia or problems with walking uses cane  or coordination,  change in mood/affect or memory.       Past Medical History:  MULTIPLE SCLEROSIS (ICD-340)........................Marland Kitchen Dr [facility], was at [facility])  - dx 12/1994  Sleep Apnea.............................................................[name]      - CPAP rx 04/2010 HYPOTHYROIDISM (ICD-244.9)  OTHER DISEASES OF VOCAL CORDS (ICD-478.5)  Cervical Cancer > complete hysterectomy...........Marland Kitchen [name] HOARSENESS (ICD-784.49).................................  [name]  Dyspnea/wheeze ? asthma  - HFA 50 % after coaching October 04, 2009 >  50% 06/25/2010  - PFT's October 04, 2009 FEV1 2.15 (95%) with ratio 69 and DLCO 58> corrects to 98, mild insp truncation  Health Maintenance.............................................Marland KitchenWert  - Pneumovax March 28, 2009  - Td  April 10, 2009        Objective:   Physical Exam  amb  white female nad / walks with cane -   Wt  118 > 127 March 28, 2009 >129 June 17 > 128 October 04, 2009 > 129 06/25/2010 >125 10/29/2010 > 128 11/26/2010 >>127 12/10/2010 > 11/17/2011  129> 126 05/20/2012 > 07/29/2012  129 > 10/20/2012  129 >  135 06/21/2013 >  11/04/2013 135 >134 04/04/2014 > 04/18/2014 135 >  07/17/2014 132> 08/17/2014  135 > 12/20/2014 > 01/17/2015 134   > 08/08/2015   126    HEENT: nl dentition, turbinates, and orophanx. Nl external ear canals without cough reflex  Neck without JVD/Nodes/TM  Lungs clear  RRR no s3 or murmur or increase in P2  Abd soft and benign with nl excursion in the supine position. No bruits or organomegaly / no  cvat Ext warm hands, sym cool feet with nl pulses and  without calf tenderness, cyanosis clubbing - both feet with trace Pitting/ sym edema purplish discoloration esp bottoms of feet and toes  Skin warm and dry without lesions Neuro:  nl sensorium, walks with cane, no obvious motor deficits / nl dtrs         Labs ordered/ reviewed:     Chemistry      Component Value Date/Time   NA 134* 08/08/2015 1622   K 3.8 08/08/2015 1622   CL 98 08/08/2015 1622   CO2 30 08/08/2015 1622   BUN 24* 08/08/2015 1622   CREATININE 0.77 08/08/2015 1622      Component Value Date/Time  CALCIUM 9.7 08/08/2015 1622   ALKPHOS 55 08/08/2015 1622   AST 20 08/08/2015 1622   ALT 14 08/08/2015 1622   BILITOT 0.3 08/08/2015 1622        Lab Results  Component Value Date   WBC 6.4 08/08/2015   HGB 12.4 08/08/2015   HCT 36.5 08/08/2015   MCV 90.3 08/08/2015   PLT 362.0 08/08/2015       Lab Results  Component Value Date   TSH 0.36 08/08/2015     Lab Results  Component Value Date   PROBNP 40.0 08/08/2015                   Assessment & Plan:

## 2015-08-08 NOTE — Patient Instructions (Addendum)
Please see patient coordinator before you leave today  to schedule venous and arterial dopplers both legs   Please remember to go to the lab  department downstairs for your tests - we will call you with the results when they are available.

## 2015-08-09 ENCOUNTER — Other Ambulatory Visit: Payer: Self-pay | Admitting: Internal Medicine

## 2015-08-09 ENCOUNTER — Encounter: Payer: Self-pay | Admitting: Internal Medicine

## 2015-08-09 DIAGNOSIS — R209 Unspecified disturbances of skin sensation: Secondary | ICD-10-CM

## 2015-08-09 NOTE — Progress Notes (Signed)
Quick Note:  Spoke with pt and notified of results per Dr. [name]. Pt verbalized understanding and denied any questions.  ______ 

## 2015-08-09 NOTE — Assessment & Plan Note (Signed)
Adequate control on present rx, reviewed > no change in rx needed   

## 2015-08-09 NOTE — Assessment & Plan Note (Signed)
-  HFA 50 % after coaching October 04, 2009 >  Better on dulera 100 > hfa  50% 06/25/2010  - PFT's October 04, 2009 FEV1 2.15 (95%) with ratio 69 and DLCO 58> corrects to 98, mild insp truncation  - 07/17/2014 p extensive coaching HFA effectiveness =    75%   All goals of chronic asthma control met including optimal function and elimination of symptoms with minimal need for rescue therapy.  Contingencies discussed in full including contacting this office immediately if not controlling the symptoms using the rule of two's.     I had an extended discussion with the patient reviewing all relevant studies completed to date and  lasting 15 to 20 minutes of a 25 minute visit    Each maintenance medication was reviewed in detail including most importantly the difference between maintenance and prns and under what circumstances the prns are to be triggered using an action plan format that is not reflected in the computer generated alphabetically organized AVS.    Please see instructions for details which were reviewed in writing and the patient given a copy highlighting the part that I personally wrote and discussed at today's ov.

## 2015-08-09 NOTE — Assessment & Plan Note (Signed)
10/2011 132 >127 05/17/12 >130 05/20/2012 >fluid restrictions> na 132 10/20/12  - d/c'd hctz with na 126 on 08/17/14 > rx lasix > resolved 12/20/2014   Adequate control on present rx, reviewed > no change in rx needed

## 2015-08-09 NOTE — Assessment & Plan Note (Addendum)
-   try off neurontin 08/17/14 > did not do > rec that she discuss this with her neurologist 12/20/2014  - art/venous dopplers rec 08/08/2015   This is sym and dependent and likely related to mild venous insufficiency with the blue color related to pooling since resolves with feet elevated so other than wearing elastic hose and continuing daily diuretics nothing else needed for now.

## 2015-08-10 ENCOUNTER — Ambulatory Visit (HOSPITAL_COMMUNITY)
Admission: RE | Admit: 2015-08-10 | Discharge: 2015-08-10 | Disposition: A | Discharge status: Home / Self Care | Payer: 59 | Source: Ambulatory Visit | Attending: Internal Medicine | Admitting: Internal Medicine

## 2015-08-10 ENCOUNTER — Encounter (HOSPITAL_COMMUNITY): Payer: 59

## 2015-08-10 DIAGNOSIS — E785 Hyperlipidemia, unspecified: Secondary | ICD-10-CM | POA: Insufficient documentation

## 2015-08-10 DIAGNOSIS — R208 Other disturbances of skin sensation: Secondary | ICD-10-CM | POA: Insufficient documentation

## 2015-08-10 DIAGNOSIS — M7989 Other specified soft tissue disorders: Secondary | ICD-10-CM

## 2015-08-10 DIAGNOSIS — G35 Multiple sclerosis: Secondary | ICD-10-CM | POA: Insufficient documentation

## 2015-08-10 DIAGNOSIS — R209 Unspecified disturbances of skin sensation: Secondary | ICD-10-CM

## 2015-08-16 ENCOUNTER — Telehealth: Payer: Self-pay | Admitting: Internal Medicine

## 2015-08-16 NOTE — Telephone Encounter (Signed)
Pt is aware of results. Nothing further was needed. 

## 2015-08-16 NOTE — Telephone Encounter (Signed)
Spoke with pt. Pt is requesting doppler results from 08/10/15.  MW - please advise. Thanks.

## 2015-08-16 NOTE — Telephone Encounter (Signed)
Sorry I thought we had resulted that already but both the veins and arteries are normal so no additional recs (keep feet elevated/ elastic support hose)

## 2015-08-23 ENCOUNTER — Telehealth: Payer: Self-pay | Admitting: Internal Medicine

## 2015-08-23 NOTE — Telephone Encounter (Signed)
Form given to Magda Paganini to have Dr Melvyn Novas advise on.

## 2015-08-27 NOTE — Telephone Encounter (Signed)
Form already signed and faxed  Spoke with the pt and notified that this was done  She verbalized understanding and nothing further needed

## 2015-09-03 ENCOUNTER — Other Ambulatory Visit: Payer: Self-pay | Admitting: Internal Medicine

## 2015-09-10 ENCOUNTER — Other Ambulatory Visit (HOSPITAL_COMMUNITY): Payer: Self-pay | Admitting: *Deleted

## 2015-09-10 NOTE — Patient Instructions (Addendum)
Kayla [name]  09/10/2015   Your procedure is scheduled on: 09-18-15  Report to [facility]  Entrance take [facility]  elevators to 3rd floor to  [place] at 1245 pm  Call this number if you have problems the morning of surgery [phone]   Remember: ONLY 1 PERSON MAY GO WITH YOU TO SHORT STAY TO GET  READY MORNING OF YOUR SURGERY.  Do not eat foodAfter Midnight, may have clear liquids from midnight until 945 am day of surgery, nothing by mouth after 945 am day of surgery.     Take these medicines the morning of surgery with A SIP OF WATER: BACLOFEN (LIORESAL), DEXILANT, DULERA INHALER, FLUOXETNE ( PROZAC), GABAPENTIN (NEURONTIN), LEVOTHYROXINE (SYNTHROID)              You may not have any metal on your body including hair pins and              piercings  Do not wear jewelry, make-up, lotions, powders or perfumes, deodorant             Do not wear nail polish.  Do not shave  48 hours prior to surgery.              Men may shave face and neck.   Do not bring valuables to the hospital. [facility].  Contacts, dentures or bridgework may not be worn into surgery.  Leave suitcase in the car. After surgery it may be brought to your room.                 Please read over the following fact sheets you were given: _____________________________________________________________________                CLEAR LIQUID DIET   Foods Allowed                                                                     Foods Excluded  Coffee and tea, regular and decaf                             liquids that you cannot  Plain Jell-O in any flavor                                             see through such as: Fruit ices (not with fruit pulp)                                     milk, soups, orange juice  Iced Popsicles                                    All solid food Carbonated beverages, regular and diet  Cranberry, grape and apple juices Sports drinks like Gatorade Lightly seasoned clear broth or consume(fat free) Sugar, honey syrup  Sample Menu Breakfast                                Lunch                                     Supper Cranberry juice                    Beef broth                            Chicken broth Jell-O                                     Grape juice                           Apple juice Coffee or tea                        Jell-O                                      Popsicle                                                Coffee or tea                        Coffee or tea  _____________________________________________________________________  [facility] - Preparing for Surgery Before surgery, you can play an important role.  Because skin is not sterile, your skin needs to be as free of germs as possible.  You can reduce the number of germs on your skin by washing with CHG (chlorahexidine gluconate) soap before surgery.  CHG is an antiseptic cleaner which kills germs and bonds with the skin to continue killing germs even after washing. Please DO NOT use if you have an allergy to CHG or antibacterial soaps.  If your skin becomes reddened/irritated stop using the CHG and inform your nurse when you arrive at Short Stay. Do not shave (including legs and underarms) for at least 48 hours prior to the first CHG shower.  You may shave your face/neck. Please follow these instructions carefully:  1.  Shower with CHG Soap the night before surgery and the  morning of Surgery.  2.  If you choose to wash your hair, wash your hair first as usual with your  normal  shampoo.  3.  After you shampoo, rinse your hair and body thoroughly to remove the  shampoo.                           4.  Use CHG as you would any other liquid soap.  You can apply chg directly  to the skin and wash  Gently with a scrungie or clean washcloth.  5.  Apply the CHG Soap to  your body ONLY FROM THE NECK DOWN.   Do not use on face/ open                           Wound or open sores. Avoid contact with eyes, ears mouth and genitals (private parts).                       Wash face,  Genitals (private parts) with your normal soap.             6.  Wash thoroughly, paying special attention to the area where your surgery  will be performed.  7.  Thoroughly rinse your body with warm water from the neck down.  8.  DO NOT shower/wash with your normal soap after using and rinsing off  the CHG Soap.                 9.  Pat yourself dry with a clean towel.            10.  Wear clean pajamas.            11.  Place clean sheets on your bed the night of your first shower and do not  sleep with pets. Day of Surgery : Do not apply any lotions/deodorants the morning of surgery.  Please wear clean clothes to the hospital/surgery center.  FAILURE TO FOLLOW THESE INSTRUCTIONS MAY RESULT IN THE CANCELLATION OF YOUR SURGERY PATIENT SIGNATURE_________________________________  NURSE SIGNATURE__________________________________  ________________________________________________________________________   [name]  An incentive spirometer is a tool that can help keep your lungs clear and active. This tool measures how well you are filling your lungs with each breath. Taking long deep breaths may help reverse or decrease the chance of developing breathing (pulmonary) problems (especially infection) following:  A long period of time when you are unable to move or be active. BEFORE THE PROCEDURE   If the spirometer includes an indicator to show your best effort, your nurse or respiratory therapist will set it to a desired goal.  If possible, sit up straight or lean slightly forward. Try not to slouch.  Hold the incentive spirometer in an upright position. INSTRUCTIONS FOR USE   Sit on the edge of your bed if possible, or sit up as far as you can in bed or on a chair.  Hold  the incentive spirometer in an upright position.  Breathe out normally.  Place the mouthpiece in your mouth and seal your lips tightly around it.  Breathe in slowly and as deeply as possible, raising the piston or the ball toward the top of the column.  Hold your breath for 3-5 seconds or for as long as possible. Allow the piston or ball to fall to the bottom of the column.  Remove the mouthpiece from your mouth and breathe out normally.  Rest for a few seconds and repeat Steps 1 through 7 at least 10 times every 1-2 hours when you are awake. Take your time and take a few normal breaths between deep breaths.  The spirometer may include an indicator to show your best effort. Use the indicator as a goal to work toward during each repetition.  After each set of 10 deep breaths, practice coughing to be sure your lungs are clear. If you have an incision (the cut made at the time  of surgery), support your incision when coughing by placing a pillow or rolled up towels firmly against it. Once you are able to get out of bed, walk around indoors and cough well. You may stop using the incentive spirometer when instructed by your caregiver.  RISKS AND COMPLICATIONS  Take your time so you do not get dizzy or light-headed.  If you are in pain, you may need to take or ask for pain medication before doing incentive spirometry. It is harder to take a deep breath if you are having pain. AFTER USE  Rest and breathe slowly and easily.  It can be helpful to keep track of a log of your progress. Your caregiver can provide you with a simple table to help with this. If you are using the spirometer at home, follow these instructions: [place] IF:   You are having difficultly using the spirometer.  You have trouble using the spirometer as often as instructed.  Your pain medication is not giving enough relief while using the spirometer.  You develop fever of 100.5 F (38.1 C) or higher. SEEK  IMMEDIATE MEDICAL CARE IF:   You cough up bloody sputum that had not been present before.  You develop fever of 102 F (38.9 C) or greater.  You develop worsening pain at or near the incision site. MAKE SURE YOU:   Understand these instructions.  Will watch your condition.  Will get help right away if you are not doing well or get worse. Document Released: 07/21/2006 Document Revised: 06/02/2011 Document Reviewed: 09/21/2006 ExitCare Patient Information 2014 ExitCare, [place].   ________________________________________________________________________  WHAT IS A BLOOD TRANSFUSION? Blood Transfusion Information  A transfusion is the replacement of blood or some of its parts. Blood is made up of multiple cells which provide different functions.  Red blood cells carry oxygen and are used for blood loss replacement.  White blood cells fight against infection.  Platelets control bleeding.  Plasma helps clot blood.  Other blood products are available for specialized needs, such as hemophilia or other clotting disorders. BEFORE THE TRANSFUSION  Who gives blood for transfusions?   Healthy volunteers who are fully evaluated to make sure their blood is safe. This is blood bank blood. Transfusion therapy is the safest it has ever been in the practice of medicine. Before blood is taken from a donor, a complete history is taken to make sure that person has no history of diseases nor engages in risky social behavior (examples are intravenous drug use or sexual activity with multiple partners). The donor's travel history is screened to minimize risk of transmitting infections, such as malaria. The donated blood is tested for signs of infectious diseases, such as HIV and hepatitis. The blood is then tested to be sure it is compatible with you in order to minimize the chance of a transfusion reaction. If you or a relative donates blood, this is often done in anticipation of surgery and is not  appropriate for emergency situations. It takes many days to process the donated blood. RISKS AND COMPLICATIONS Although transfusion therapy is very safe and saves many lives, the main dangers of transfusion include:   Getting an infectious disease.  Developing a transfusion reaction. This is an allergic reaction to something in the blood you were given. Every precaution is taken to prevent this. The decision to have a blood transfusion has been considered carefully by your caregiver before blood is given. Blood is not given unless the benefits outweigh the risks. AFTER  THE TRANSFUSION  Right after receiving a blood transfusion, you will usually feel much better and more energetic. This is especially true if your red blood cells have gotten low (anemic). The transfusion raises the level of the red blood cells which carry oxygen, and this usually causes an energy increase.  The nurse administering the transfusion will monitor you carefully for complications. HOME CARE INSTRUCTIONS  No special instructions are needed after a transfusion. You may find your energy is better. Speak with your caregiver about any limitations on activity for underlying diseases you may have. SEEK MEDICAL CARE IF:   Your condition is not improving after your transfusion.  You develop redness or irritation at the intravenous (IV) site. SEEK IMMEDIATE MEDICAL CARE IF:  Any of the following symptoms occur over the next 12 hours:  Shaking chills.  You have a temperature by mouth above 102 F (38.9 C), not controlled by medicine.  Chest, back, or muscle pain.  People around you feel you are not acting correctly or are confused.  Shortness of breath or difficulty breathing.  Dizziness and fainting.  You get a rash or develop hives.  You have a decrease in urine output.  Your urine turns a dark color or changes to pink, red, or brown. Any of the following symptoms occur over the next 10 days:  You have a  temperature by mouth above 102 F (38.9 C), not controlled by medicine.  Shortness of breath.  Weakness after normal activity.  The white part of the eye turns yellow (jaundice).  You have a decrease in the amount of urine or are urinating less often.  Your urine turns a dark color or changes to pink, red, or brown. Document Released: 03/07/2000 Document Revised: 06/02/2011 Document Reviewed: 10/25/2007 [facility] Patient Information 2014 [place], [place].  _______________________________________________________________________

## 2015-09-10 NOTE — Progress Notes (Signed)
PULMONARY CLEARANCE NOTE 08-08-15 DR Melvyn Novas ON CHART FOR 09-17-15 SURGERY LOV NEUROLOGY DR JEFFREY 07-09-15 ON CHART LOV NOTE DR Melvyn Novas 08-08-15 EPIC

## 2015-09-11 ENCOUNTER — Encounter (HOSPITAL_COMMUNITY): Payer: Self-pay

## 2015-09-11 ENCOUNTER — Encounter (HOSPITAL_COMMUNITY)
Admission: RE | Admit: 2015-09-11 | Discharge: 2015-09-11 | Disposition: A | Discharge status: Home / Self Care | Payer: 59 | Source: Ambulatory Visit | Attending: Orthopedic Surgery | Admitting: Orthopedic Surgery

## 2015-09-11 DIAGNOSIS — Z0183 Encounter for blood typing: Secondary | ICD-10-CM | POA: Diagnosis not present

## 2015-09-11 DIAGNOSIS — Y838 Other surgical procedures as the cause of abnormal reaction of the patient, or of later complication, without mention of misadventure at the time of the procedure: Secondary | ICD-10-CM | POA: Insufficient documentation

## 2015-09-11 DIAGNOSIS — T8484XA Pain due to internal orthopedic prosthetic devices, implants and grafts, initial encounter: Secondary | ICD-10-CM | POA: Diagnosis not present

## 2015-09-11 DIAGNOSIS — Z96641 Presence of right artificial hip joint: Secondary | ICD-10-CM | POA: Diagnosis not present

## 2015-09-11 DIAGNOSIS — Z01812 Encounter for preprocedural laboratory examination: Secondary | ICD-10-CM | POA: Diagnosis not present

## 2015-09-11 HISTORY — DX: Chronic obstructive pulmonary disease, unspecified: J44.9

## 2015-09-11 HISTORY — DX: Unspecified osteoarthritis, unspecified site: M19.90

## 2015-09-11 HISTORY — DX: Pneumonia, unspecified organism: J18.9

## 2015-09-11 HISTORY — DX: Sleep apnea, unspecified: G47.30

## 2015-09-11 LAB — CBC
HCT: 36.4 % (ref 36.0–46.0)
Hemoglobin: 12.3 g/dL (ref 12.0–15.0)
MCH: 30.4 pg (ref 26.0–34.0)
MCHC: 33.8 g/dL (ref 30.0–36.0)
MCV: 90.1 fL (ref 78.0–100.0)
Platelets: 384 10*3/uL (ref 150–400)
RBC: 4.04 MIL/uL (ref 3.87–5.11)
RDW: 13.1 % (ref 11.5–15.5)
WBC: 8.7 10*3/uL (ref 4.0–10.5)

## 2015-09-11 LAB — BASIC METABOLIC PANEL
Anion gap: 8 (ref 5–15)
BUN: 25 mg/dL — ABNORMAL HIGH (ref 6–20)
CO2: 27 mmol/L (ref 22–32)
Calcium: 9.4 mg/dL (ref 8.9–10.3)
Chloride: 99 mmol/L — ABNORMAL LOW (ref 101–111)
Creatinine, Ser: 0.63 mg/dL (ref 0.44–1.00)
GFR calc Af Amer: >60 mL/min (ref >60)
GFR calc non Af Amer: >60 mL/min (ref >60)
Glucose, Bld: 86 mg/dL (ref 65–99)
Potassium: 4.1 mmol/L (ref 3.5–5.1)
Sodium: 134 mmol/L — ABNORMAL LOW (ref 135–145)

## 2015-09-11 LAB — SURGICAL PCR SCREEN
MRSA, PCR: NEGATIVE
Staphylococcus aureus: NEGATIVE

## 2015-09-12 NOTE — H&P (Signed)
TOTAL HIP REVISION ADMISSION H&P  Patient is admitted for right hip conversion from hemiarthroplasty to total hip arthroplasty.  Subjective:  Chief Complaint: Right hip pain S/P hemiarthroplasty  HPI: Kayla [name], 65 y.o. female, has a history of pain and functional disability in the right hip due to trauma and patient has failed non-surgical conservative treatments for greater than 12 weeks to include NSAID's and/or analgesics, use of assistive devices and activity modification. The indications for the revision total hip arthroplasty are bearing surface wear leading to  symptomatic synovitis.  Onset of symptoms was abrupt starting in December 2014 with gradually worsening course since that time.  Prior procedures on the right hip include hemi-arthroplasty.  Patient currently rates pain in the right hip at 6 out of 10 with activity.  There is night pain, worsening of pain with activity and weight bearing, trendelenberg gait, pain that interfers with activities of daily living and pain with passive range of motion. Patient has evidence of previous right hip hemiarthroplasty by imaging studies.  This condition presents safety issues increasing the risk of falls.   There is no current active infection.   Risks, benefits and expectations were discussed with the patient.  Risks including but not limited to the risk of anesthesia, blood clots, nerve damage, blood vessel damage, failure of the prosthesis, infection and up to and including death.  Patient understand the risks, benefits and expectations and wishes to proceed with surgery.   PCP: Christinia Gully, MD  D/C Plans:      Home with HHPT  Post-op Meds:       No Rx given  Tranexamic Acid:      To be given - IV   Decadron:      Is to be given  FYI:     ASA  Tramadol & APAP (unable to tolerate many pain medications)  Known hypotension    Patient Active Problem List   Diagnosis Date Noted  . Leg swelling 08/21/2014  . Acute URI 04/04/2014   . Health care maintenance 12/16/2013  . Other and unspecified hyperlipidemia 11/04/2013  . 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 S/P hip hemiarthroplasty 03/15/2013  . Dislocation of hip prosthesis ([place]) 03/12/2013  . Hip dislocation, left ([place]) 03/12/2013  . Hip dislocation, right ([place]) 03/12/2013  . Urinary retention with incomplete bladder emptying 03/09/2013  . Dysphagia, pharyngoesophageal phase 03/09/2013  . Femoral neck fracture ([place]) 03/08/2013  . Unspecified constipation 02/26/2013  . Fractured femoral neck ([place]) 02/23/2013  . Hip fracture ([place]) 02/23/2013  . Excessive flatus 10/21/2012  . Cold feet 07/29/2012  . Hyponatremia 05/20/2012  . Calf pain 11/17/2011  . Dyspnea 12/06/2007  . OSTEOPENIA 09/17/2007  . Hypothyroidism 04/01/2007  . Multiple sclerosis ([place]) 04/01/2007  . Other diseases of vocal cords 04/01/2007   Past Medical History  Diagnosis Date  . Multiple sclerosis ([place])   . Unspecified hypothyroidism   . Other voice and resonance disorders   . Other diseases of vocal cords     vocal cords are not even  . Cervical cancer ([place]) 2005  . Osteoporosis   . Sleep apnea     no cpap used  . COPD (chronic obstructive pulmonary disease) ([place])   . Arthritis     oa  . Pneumonia 2014, 1992    Past Surgical History  Procedure Laterality Date  . Vesicovaginal fistula closure w/ tah  2005  . Hip arthroplasty Right 03/01/2013    Procedure: ARTHROPLASTY BIPOLAR HIP;  Surgeon: Mauri Pole,  MD;  Location: [facility];  Service: Orthopedics;  Laterality: Right;  . Hip closed reduction Right 03/12/2013    Procedure: CLOSED REDUCTION HIP;  Surgeon: Gearlean Alf, MD;  Location: [facility];  Service: Orthopedics;  Laterality: Right;  . Hip closed reduction Right 03/19/2013    Procedure: CLOSED REDUCTION HIP;  Surgeon: Johnn Hai, MD;  Location: [facility];  Service: Orthopedics;  Laterality: Right;  . Abdominal hysterectomy  2005    for cervical cancer, complete  . Eye surgery  Bilateral     lens for cataracts    No prescriptions prior to admission   Allergies  Allergen Reactions  . Methylprednisolone Other (See Comments)    Pt states cannot take any steroids b/c they cause a decrease in her heart rate, when taken in very large quantities  . Morphine And Related Other (See Comments)    Excessive sedation. Hallucinations.  . Oxycodone-Acetaminophen     Rash head to toe and hallucinations  . Phenytoin Sodium Extended Rash  . Zanaflex [Tizanidine Hydrochloride] Rash    Social History  Substance Use Topics  . Smoking status: Never Smoker   . Smokeless tobacco: Never Used  . Alcohol Use: Yes     Comment: rare    Family History  Problem Relation Age of Onset  . Colon cancer Father       Review of Systems  Constitutional: Positive for malaise/fatigue.  HENT: Negative.   Eyes: Negative.   Respiratory: Negative.   Cardiovascular: Negative.   Gastrointestinal: Positive for heartburn.  Genitourinary: Negative.   Musculoskeletal: Positive for joint pain.  Skin: Negative.   Neurological: Negative.   Endo/Heme/Allergies: Negative.   Psychiatric/Behavioral: Negative.     Objective:  Physical Exam  Constitutional: She is oriented to person, place, and time. She appears well-developed.  HENT:  Head: Normocephalic.  Eyes: Pupils are equal, round, and reactive to light.  Neck: Neck supple. No JVD present. No tracheal deviation present. No thyromegaly present.  Cardiovascular: Normal rate, regular rhythm, normal heart sounds and intact distal pulses.   Respiratory: Effort normal and breath sounds normal. No stridor. No respiratory distress. She has no wheezes.  GI: Soft. There is no tenderness. There is no guarding.  Musculoskeletal:       Right hip: She exhibits decreased range of motion, decreased strength, tenderness, bony tenderness and laceration (healed previous incision). She exhibits no swelling and no deformity.  Lymphadenopathy:    She has no  cervical adenopathy.  Neurological: She is alert and oriented to person, place, and time.  Skin: Skin is warm and dry.  Psychiatric: She has a normal mood and affect.      Labs:  Estimated body mass index is 22.33 kg/(m^2) as calculated from the following:   Height as of 08/08/15: 5\' 3"  (1.6 m).   Weight as of 08/08/15: 57.153 kg (126 lb).  Imaging Review:  Plain radiographs demonstrate previous hemiarthroplasty of the right hip(s). The bone quality appears to be good for age and reported activity level.   Assessment/Plan:  Right hip with failed previous hemiarthroplasty.  The patient history, physical examination, clinical judgement of the provider and imaging studies are consistent with previous hemiarthroplasty of the right hip(s). Revision total hip arthroplasty is deemed medically necessary. The treatment options including medical management, injection therapy, arthroscopy and arthroplasty were discussed at length. The risks and benefits of total hip arthroplasty were presented and reviewed. The risks due to aseptic loosening, infection, stiffness, dislocation/subluxation,  thromboembolic complications and other  imponderables were discussed.  The patient acknowledged the explanation, agreed to proceed with the plan and consent was signed. Patient is being admitted for inpatient treatment for surgery, pain control, PT, OT, prophylactic antibiotics, VTE prophylaxis, progressive ambulation and ADL's and discharge planning. The patient is planning to be discharged home with home health services.      [place] Braxen Dobek   PA-C  09/12/2015, 5:29 PM

## 2015-09-18 ENCOUNTER — Inpatient Hospital Stay (HOSPITAL_COMMUNITY): Payer: 59

## 2015-09-18 ENCOUNTER — Encounter (HOSPITAL_COMMUNITY): Payer: Self-pay | Admitting: *Deleted

## 2015-09-18 ENCOUNTER — Inpatient Hospital Stay (HOSPITAL_COMMUNITY): Payer: 59 | Admitting: Anesthesiology

## 2015-09-18 ENCOUNTER — Inpatient Hospital Stay (HOSPITAL_COMMUNITY)
Admission: RE | Admit: 2015-09-18 | Discharge: 2015-09-20 | DRG: 468 | Disposition: A | Discharge status: Home Health | Payer: 59 | Source: Ambulatory Visit | Attending: Orthopedic Surgery | Admitting: Orthopedic Surgery

## 2015-09-18 ENCOUNTER — Encounter (HOSPITAL_COMMUNITY)
Admission: RE | Disposition: A | Discharge status: Home Health | Payer: Self-pay | Source: Ambulatory Visit | Attending: Orthopedic Surgery

## 2015-09-18 DIAGNOSIS — E785 Hyperlipidemia, unspecified: Secondary | ICD-10-CM | POA: Diagnosis present

## 2015-09-18 DIAGNOSIS — G4730 Sleep apnea, unspecified: Secondary | ICD-10-CM | POA: Diagnosis present

## 2015-09-18 DIAGNOSIS — Z888 Allergy status to other drugs, medicaments and biological substances status: Secondary | ICD-10-CM | POA: Diagnosis not present

## 2015-09-18 DIAGNOSIS — Z96649 Presence of unspecified artificial hip joint: Secondary | ICD-10-CM

## 2015-09-18 DIAGNOSIS — E876 Hypokalemia: Secondary | ICD-10-CM | POA: Diagnosis not present

## 2015-09-18 DIAGNOSIS — J449 Chronic obstructive pulmonary disease, unspecified: Secondary | ICD-10-CM | POA: Diagnosis present

## 2015-09-18 DIAGNOSIS — Z90710 Acquired absence of both cervix and uterus: Secondary | ICD-10-CM

## 2015-09-18 DIAGNOSIS — E039 Hypothyroidism, unspecified: Secondary | ICD-10-CM | POA: Diagnosis present

## 2015-09-18 DIAGNOSIS — Z8701 Personal history of pneumonia (recurrent): Secondary | ICD-10-CM | POA: Diagnosis not present

## 2015-09-18 DIAGNOSIS — M810 Age-related osteoporosis without current pathological fracture: Secondary | ICD-10-CM | POA: Diagnosis present

## 2015-09-18 DIAGNOSIS — G35 Multiple sclerosis: Secondary | ICD-10-CM | POA: Diagnosis present

## 2015-09-18 DIAGNOSIS — Z800 Family history of malignant neoplasm of digestive organs: Secondary | ICD-10-CM | POA: Diagnosis not present

## 2015-09-18 DIAGNOSIS — M659 Synovitis and tenosynovitis, unspecified: Secondary | ICD-10-CM | POA: Diagnosis present

## 2015-09-18 DIAGNOSIS — Z8541 Personal history of malignant neoplasm of cervix uteri: Secondary | ICD-10-CM

## 2015-09-18 DIAGNOSIS — Z885 Allergy status to narcotic agent status: Secondary | ICD-10-CM

## 2015-09-18 DIAGNOSIS — Y792 Prosthetic and other implants, materials and accessory orthopedic devices associated with adverse incidents: Secondary | ICD-10-CM | POA: Diagnosis present

## 2015-09-18 DIAGNOSIS — M1611 Unilateral primary osteoarthritis, right hip: Secondary | ICD-10-CM | POA: Diagnosis present

## 2015-09-18 DIAGNOSIS — T8489XA Other specified complication of internal orthopedic prosthetic devices, implants and grafts, initial encounter: Principal | ICD-10-CM | POA: Diagnosis present

## 2015-09-18 DIAGNOSIS — M25551 Pain in right hip: Secondary | ICD-10-CM | POA: Diagnosis present

## 2015-09-18 HISTORY — PX: CONVERSION TO TOTAL HIP: SHX5784

## 2015-09-18 LAB — TYPE AND SCREEN
ABO/RH(D): O POS
Antibody Screen: NEGATIVE

## 2015-09-18 SURGERY — CONVERSION, PREVIOUS HIP SURGERY, TO TOTAL HIP ARTHROPLASTY
Anesthesia: General | Site: Hip | Laterality: Right

## 2015-09-18 MED ORDER — TRAMADOL HCL 50 MG PO TABS
50.0000 mg | ORAL_TABLET | Freq: Four times a day (QID) | ORAL | Status: DC
  Administered 2015-09-18: 50 mg via ORAL
  Administered 2015-09-19 (×3): 100 mg via ORAL
  Filled 2015-09-18 (×5): qty 2
  Filled 2015-09-18: qty 1

## 2015-09-18 MED ORDER — GABAPENTIN 300 MG PO CAPS
600.0000 mg | ORAL_CAPSULE | Freq: Two times a day (BID) | ORAL | Status: DC
  Administered 2015-09-18 – 2015-09-20 (×4): 600 mg via ORAL
  Filled 2015-09-18 (×4): qty 2

## 2015-09-18 MED ORDER — HYDROMORPHONE HCL 1 MG/ML IJ SOLN
INTRAMUSCULAR | Status: AC
  Filled 2015-09-18: qty 1

## 2015-09-18 MED ORDER — STERILE WATER FOR IRRIGATION IR SOLN
Status: DC | PRN
  Administered 2015-09-18: 2000 mL

## 2015-09-18 MED ORDER — SODIUM CHLORIDE 0.9 % IV SOLN
1000.0000 mg | INTRAVENOUS | Status: AC
  Administered 2015-09-18: 1000 mg via INTRAVENOUS
  Filled 2015-09-18: qty 10

## 2015-09-18 MED ORDER — POLYETHYLENE GLYCOL 3350 17 G PO PACK
17.0000 g | PACK | Freq: Two times a day (BID) | ORAL | Status: DC
  Administered 2015-09-18 – 2015-09-20 (×4): 17 g via ORAL
  Filled 2015-09-18 (×3): qty 1

## 2015-09-18 MED ORDER — PHENOL 1.4 % MT LIQD: 1.0000 | OROMUCOSAL | Status: DC | PRN

## 2015-09-18 MED ORDER — PHENYLEPHRINE 40 MCG/ML (10ML) SYRINGE FOR IV PUSH (FOR BLOOD PRESSURE SUPPORT)
PREFILLED_SYRINGE | INTRAVENOUS | Status: AC
  Filled 2015-09-18: qty 40

## 2015-09-18 MED ORDER — DOCUSATE SODIUM 100 MG PO CAPS
100.0000 mg | ORAL_CAPSULE | Freq: Two times a day (BID) | ORAL | Status: DC
  Administered 2015-09-18 – 2015-09-20 (×4): 100 mg via ORAL
  Filled 2015-09-18 (×4): qty 1

## 2015-09-18 MED ORDER — LIDOCAINE HCL (CARDIAC) 20 MG/ML IV SOLN
INTRAVENOUS | Status: AC
  Filled 2015-09-18: qty 10

## 2015-09-18 MED ORDER — MOMETASONE FURO-FORMOTEROL FUM 100-5 MCG/ACT IN AERO
2.0000 | INHALATION_SPRAY | Freq: Two times a day (BID) | RESPIRATORY_TRACT | Status: DC
  Administered 2015-09-19 – 2015-09-20 (×3): 2 via RESPIRATORY_TRACT

## 2015-09-18 MED ORDER — FUROSEMIDE 20 MG PO TABS
20.0000 mg | ORAL_TABLET | Freq: Every day | ORAL | Status: DC
  Administered 2015-09-19 – 2015-09-20 (×2): 20 mg via ORAL
  Filled 2015-09-18 (×2): qty 1

## 2015-09-18 MED ORDER — LACTATED RINGERS IV SOLN
INTRAVENOUS | Status: DC
  Administered 2015-09-18 (×2): via INTRAVENOUS

## 2015-09-18 MED ORDER — PANTOPRAZOLE SODIUM 40 MG PO TBEC
40.0000 mg | DELAYED_RELEASE_TABLET | Freq: Every day | ORAL | Status: DC
  Administered 2015-09-19 – 2015-09-20 (×2): 40 mg via ORAL
  Filled 2015-09-18 (×2): qty 1

## 2015-09-18 MED ORDER — METOCLOPRAMIDE HCL 5 MG PO TABS: 5.0000 mg | ORAL_TABLET | Freq: Three times a day (TID) | ORAL | Status: DC | PRN

## 2015-09-18 MED ORDER — PROPOFOL 10 MG/ML IV BOLUS
INTRAVENOUS | Status: AC
  Filled 2015-09-18: qty 20

## 2015-09-18 MED ORDER — HYDROMORPHONE HCL 1 MG/ML IJ SOLN
0.5000 mg | INTRAMUSCULAR | Status: DC | PRN
  Administered 2015-09-19: 0.5 mg via INTRAVENOUS
  Filled 2015-09-18: qty 1

## 2015-09-18 MED ORDER — FERROUS SULFATE 325 (65 FE) MG PO TABS
325.0000 mg | ORAL_TABLET | Freq: Three times a day (TID) | ORAL | Status: DC
  Administered 2015-09-19 – 2015-09-20 (×2): 325 mg via ORAL
  Filled 2015-09-18 (×3): qty 1

## 2015-09-18 MED ORDER — MENTHOL 3 MG MT LOZG: 1.0000 | LOZENGE | OROMUCOSAL | Status: DC | PRN

## 2015-09-18 MED ORDER — TEMAZEPAM 15 MG PO CAPS
15.0000 mg | ORAL_CAPSULE | Freq: Every evening | ORAL | Status: DC | PRN
Start: 2015-09-18 — End: 2015-09-20

## 2015-09-18 MED ORDER — 0.9 % SODIUM CHLORIDE (POUR BTL) OPTIME
TOPICAL | Status: DC | PRN
  Administered 2015-09-18: 1000 mL

## 2015-09-18 MED ORDER — ALUM & MAG HYDROXIDE-SIMETH 200-200-20 MG/5ML PO SUSP
30.0000 mL | ORAL | Status: DC | PRN
  Administered 2015-09-19: 30 mL via ORAL
  Filled 2015-09-18: qty 30

## 2015-09-18 MED ORDER — FENTANYL CITRATE (PF) 250 MCG/5ML IJ SOLN
INTRAMUSCULAR | Status: AC
Start: 2015-09-18 — End: 2015-09-18
  Filled 2015-09-18: qty 5

## 2015-09-18 MED ORDER — MIDAZOLAM HCL 2 MG/2ML IJ SOLN
INTRAMUSCULAR | Status: DC | PRN
  Administered 2015-09-18 (×2): 1 mg via INTRAVENOUS

## 2015-09-18 MED ORDER — CELECOXIB 200 MG PO CAPS
200.0000 mg | ORAL_CAPSULE | Freq: Two times a day (BID) | ORAL | Status: DC
  Administered 2015-09-18 – 2015-09-20 (×4): 200 mg via ORAL
  Filled 2015-09-18 (×4): qty 1

## 2015-09-18 MED ORDER — PROPOFOL 10 MG/ML IV BOLUS
INTRAVENOUS | Status: DC | PRN
  Administered 2015-09-18: 120 mg via INTRAVENOUS

## 2015-09-18 MED ORDER — METHOCARBAMOL 500 MG PO TABS
500.0000 mg | ORAL_TABLET | Freq: Four times a day (QID) | ORAL | Status: DC | PRN
  Administered 2015-09-19: 500 mg via ORAL
  Filled 2015-09-18: qty 1

## 2015-09-18 MED ORDER — ASPIRIN EC 325 MG PO TBEC
325.0000 mg | DELAYED_RELEASE_TABLET | Freq: Two times a day (BID) | ORAL | Status: DC
  Administered 2015-09-19 – 2015-09-20 (×3): 325 mg via ORAL
  Filled 2015-09-18 (×3): qty 1

## 2015-09-18 MED ORDER — BISACODYL 10 MG RE SUPP: 10.0000 mg | Freq: Every day | RECTAL | Status: DC | PRN

## 2015-09-18 MED ORDER — CEFAZOLIN SODIUM-DEXTROSE 2-4 GM/100ML-% IV SOLN
INTRAVENOUS | Status: AC
  Filled 2015-09-18: qty 100

## 2015-09-18 MED ORDER — FENTANYL CITRATE (PF) 250 MCG/5ML IJ SOLN
INTRAMUSCULAR | Status: DC | PRN
  Administered 2015-09-18: 25 ug via INTRAVENOUS
  Administered 2015-09-18 (×3): 50 ug via INTRAVENOUS
  Administered 2015-09-18: 25 ug via INTRAVENOUS

## 2015-09-18 MED ORDER — ROCURONIUM BROMIDE 100 MG/10ML IV SOLN
INTRAVENOUS | Status: DC | PRN
  Administered 2015-09-18: 40 mg via INTRAVENOUS

## 2015-09-18 MED ORDER — MOMETASONE FURO-FORMOTEROL FUM 100-5 MCG/ACT IN AERO
2.0000 | INHALATION_SPRAY | Freq: Two times a day (BID) | RESPIRATORY_TRACT | Status: DC | PRN
  Administered 2015-09-18: 2 via RESPIRATORY_TRACT
  Filled 2015-09-18: qty 8.8

## 2015-09-18 MED ORDER — DEXAMETHASONE SODIUM PHOSPHATE 10 MG/ML IJ SOLN
10.0000 mg | Freq: Once | INTRAMUSCULAR | Status: AC
  Administered 2015-09-19: 10 mg via INTRAVENOUS
  Filled 2015-09-18: qty 1

## 2015-09-18 MED ORDER — METOCLOPRAMIDE HCL 5 MG/ML IJ SOLN
5.0000 mg | Freq: Three times a day (TID) | INTRAMUSCULAR | Status: DC | PRN
  Administered 2015-09-19: 10 mg via INTRAVENOUS
  Filled 2015-09-18: qty 2

## 2015-09-18 MED ORDER — METHOCARBAMOL 1000 MG/10ML IJ SOLN
500.0000 mg | Freq: Four times a day (QID) | INTRAVENOUS | Status: DC | PRN
  Administered 2015-09-18: 500 mg via INTRAVENOUS
  Filled 2015-09-18: qty 550
  Filled 2015-09-18: qty 5

## 2015-09-18 MED ORDER — SODIUM CHLORIDE 0.9 % IV SOLN
100.0000 mL/h | INTRAVENOUS | Status: DC
  Administered 2015-09-18: 100 mL/h via INTRAVENOUS
  Filled 2015-09-18 (×3): qty 1000

## 2015-09-18 MED ORDER — CEFAZOLIN SODIUM-DEXTROSE 2-4 GM/100ML-% IV SOLN
2.0000 g | INTRAVENOUS | Status: AC
  Administered 2015-09-18: 2 g via INTRAVENOUS

## 2015-09-18 MED ORDER — ACETAMINOPHEN 500 MG PO TABS
1000.0000 mg | ORAL_TABLET | Freq: Three times a day (TID) | ORAL | Status: DC
  Administered 2015-09-18 – 2015-09-20 (×5): 1000 mg via ORAL
  Filled 2015-09-18 (×5): qty 2

## 2015-09-18 MED ORDER — ONDANSETRON HCL 4 MG/2ML IJ SOLN
INTRAMUSCULAR | Status: AC
  Filled 2015-09-18: qty 4

## 2015-09-18 MED ORDER — MIDAZOLAM HCL 2 MG/2ML IJ SOLN
INTRAMUSCULAR | Status: AC
  Filled 2015-09-18: qty 2

## 2015-09-18 MED ORDER — EPHEDRINE SULFATE 50 MG/ML IJ SOLN
INTRAMUSCULAR | Status: DC | PRN
  Administered 2015-09-18 (×3): 5 mg via INTRAVENOUS

## 2015-09-18 MED ORDER — PHENYLEPHRINE HCL 10 MG/ML IJ SOLN
INTRAMUSCULAR | Status: DC | PRN
  Administered 2015-09-18 (×2): 80 ug via INTRAVENOUS
  Administered 2015-09-18 (×4): 40 ug via INTRAVENOUS
  Administered 2015-09-18: 120 ug via INTRAVENOUS

## 2015-09-18 MED ORDER — ONDANSETRON HCL 4 MG PO TABS: 4.0000 mg | ORAL_TABLET | Freq: Four times a day (QID) | ORAL | Status: DC | PRN

## 2015-09-18 MED ORDER — HYDROMORPHONE HCL 1 MG/ML IJ SOLN
0.2500 mg | INTRAMUSCULAR | Status: DC | PRN
  Administered 2015-09-18 (×4): 0.5 mg via INTRAVENOUS

## 2015-09-18 MED ORDER — PROMETHAZINE HCL 25 MG/ML IJ SOLN: 6.2500 mg | INTRAMUSCULAR | Status: DC | PRN

## 2015-09-18 MED ORDER — MAGNESIUM CITRATE PO SOLN: 1.0000 | Freq: Once | ORAL | Status: DC | PRN

## 2015-09-18 MED ORDER — FLUOXETINE HCL 20 MG PO CAPS
40.0000 mg | ORAL_CAPSULE | Freq: Every day | ORAL | Status: DC
  Administered 2015-09-19 – 2015-09-20 (×2): 40 mg via ORAL
  Filled 2015-09-18 (×2): qty 2

## 2015-09-18 MED ORDER — CEFAZOLIN SODIUM-DEXTROSE 2-4 GM/100ML-% IV SOLN
2.0000 g | Freq: Four times a day (QID) | INTRAVENOUS | Status: AC
  Administered 2015-09-18 – 2015-09-19 (×2): 2 g via INTRAVENOUS
  Filled 2015-09-18 (×2): qty 100

## 2015-09-18 MED ORDER — TRANEXAMIC ACID 1000 MG/10ML IV SOLN
1000.0000 mg | Freq: Once | INTRAVENOUS | Status: AC
  Administered 2015-09-18: 1000 mg via INTRAVENOUS
  Filled 2015-09-18: qty 10

## 2015-09-18 MED ORDER — EPHEDRINE SULFATE 50 MG/ML IJ SOLN
INTRAMUSCULAR | Status: AC
  Filled 2015-09-18: qty 2

## 2015-09-18 MED ORDER — SUGAMMADEX SODIUM 200 MG/2ML IV SOLN
INTRAVENOUS | Status: DC | PRN
  Administered 2015-09-18: 110 mg via INTRAVENOUS

## 2015-09-18 MED ORDER — LIDOCAINE HCL (CARDIAC) 20 MG/ML IV SOLN
INTRAVENOUS | Status: DC | PRN
  Administered 2015-09-18: 40 mg via INTRAVENOUS

## 2015-09-18 MED ORDER — LEVOTHYROXINE SODIUM 100 MCG PO TABS
100.0000 ug | ORAL_TABLET | Freq: Every day | ORAL | Status: DC
  Administered 2015-09-19 – 2015-09-20 (×2): 100 ug via ORAL
  Filled 2015-09-18 (×2): qty 1

## 2015-09-18 MED ORDER — DEXTROAMPHETAMINE SULFATE ER 5 MG PO CP24: 10.0000 mg | ORAL_CAPSULE | Freq: Every day | ORAL | Status: DC

## 2015-09-18 MED ORDER — PHENYLEPHRINE HCL 10 MG/ML IJ SOLN
INTRAMUSCULAR | Status: AC
  Filled 2015-09-18: qty 3

## 2015-09-18 MED ORDER — ONDANSETRON HCL 4 MG/2ML IJ SOLN
4.0000 mg | Freq: Four times a day (QID) | INTRAMUSCULAR | Status: DC | PRN
  Administered 2015-09-19: 4 mg via INTRAVENOUS
  Filled 2015-09-18: qty 2

## 2015-09-18 MED ORDER — ONDANSETRON HCL 4 MG/2ML IJ SOLN
INTRAMUSCULAR | Status: DC | PRN
  Administered 2015-09-18: 4 mg via INTRAVENOUS

## 2015-09-18 MED ORDER — DEXAMETHASONE SODIUM PHOSPHATE 10 MG/ML IJ SOLN: 10.0000 mg | Freq: Once | INTRAMUSCULAR | Status: DC

## 2015-09-18 SURGICAL SUPPLY — 48 items
BAG SPEC THK2 15X12 ZIP CLS (MISCELLANEOUS) ×1
BAG ZIPLOCK 12X15 (MISCELLANEOUS) ×2 IMPLANT
BLADE SAW SGTL 18X1.27X75 (BLADE) ×2 IMPLANT
CUP ACETBLR 52 OD PINNACLE (Hips) ×1 IMPLANT
DRAPE INCISE IOBAN 85X60 (DRAPES) ×2 IMPLANT
DRAPE ORTHO SPLIT 77X108 STRL (DRAPES) ×4
DRAPE POUCH INSTRU U-SHP 10X18 (DRAPES) ×2 IMPLANT
DRAPE SURG 17X11 SM STRL (DRAPES) ×2 IMPLANT
DRAPE SURG ORHT 6 SPLT 77X108 (DRAPES) ×2 IMPLANT
DRAPE U-SHAPE 47X51 STRL (DRAPES) ×2 IMPLANT
DRESSING AQUACEL AG SP 3.5X10 (GAUZE/BANDAGES/DRESSINGS) IMPLANT
DRSG AQUACEL AG SP 3.5X10 (GAUZE/BANDAGES/DRESSINGS) ×2
DURAPREP 26ML APPLICATOR (WOUND CARE) ×2 IMPLANT
ELECT BLADE TIP CTD 4 INCH (ELECTRODE) ×2 IMPLANT
ELECT REM PT RETURN 9FT ADLT (ELECTROSURGICAL) ×2
ELECTRODE REM PT RTRN 9FT ADLT (ELECTROSURGICAL) ×1 IMPLANT
ELIMINATOR HOLE APEX DEPUY (Hips) ×1 IMPLANT
FACESHIELD WRAPAROUND (MASK) ×10 IMPLANT
FACESHIELD WRAPAROUND OR TEAM (MASK) ×4 IMPLANT
GLOVE BIOGEL PI IND STRL 7.5 (GLOVE) ×1 IMPLANT
GLOVE BIOGEL PI IND STRL 8.5 (GLOVE) ×1 IMPLANT
GLOVE BIOGEL PI INDICATOR 7.5 (GLOVE) ×5
GLOVE BIOGEL PI INDICATOR 8.5 (GLOVE) ×1
GLOVE ORTHO TXT STRL SZ7.5 (GLOVE) ×4 IMPLANT
GLOVE SURG ORTHO 8.0 STRL STRW (GLOVE) ×2 IMPLANT
GLOVE SURG SS PI 7.0 STRL IVOR (GLOVE) ×1 IMPLANT
GLOVE SURG SS PI 7.5 STRL IVOR (GLOVE) ×1 IMPLANT
GOWN STRL REUS W/TWL LRG LVL3 (GOWN DISPOSABLE) ×2 IMPLANT
GOWN STRL REUS W/TWL XL LVL3 (GOWN DISPOSABLE) ×5 IMPLANT
HEAD CERAMIC 36 PLUS5 (Hips) ×1 IMPLANT
LINER NEUTRAL 52X36MM PLUS 4 (Liner) ×1 IMPLANT
LIQUID BAND (GAUZE/BANDAGES/DRESSINGS) ×1 IMPLANT
MANIFOLD NEPTUNE II (INSTRUMENTS) ×2 IMPLANT
POSITIONER SURGICAL ARM (MISCELLANEOUS) ×2 IMPLANT
SCREW 6.5MMX30MM (Screw) ×1 IMPLANT
SCREW PINN CAN 6.5X20 (Screw) ×1 IMPLANT
SPONGE LAP 18X18 X RAY DECT (DISPOSABLE) ×2 IMPLANT
STAPLER VISISTAT 35W (STAPLE) ×2 IMPLANT
SUCTION FRAZIER HANDLE 10FR (MISCELLANEOUS) ×1
SUCTION TUBE FRAZIER 10FR DISP (MISCELLANEOUS) ×1 IMPLANT
SUT MNCRL AB 3-0 PS2 18 (SUTURE) ×1 IMPLANT
SUT VIC AB 1 CT1 36 (SUTURE) ×6 IMPLANT
SUT VIC AB 2-0 CT1 27 (SUTURE) ×6
SUT VIC AB 2-0 CT1 TAPERPNT 27 (SUTURE) ×3 IMPLANT
SUT VLOC 180 0 24IN GS25 (SUTURE) ×4 IMPLANT
TOWEL OR 17X26 10 PK STRL BLUE (TOWEL DISPOSABLE) ×4 IMPLANT
TRAY FOLEY W/METER SILVER 14FR (SET/KITS/TRAYS/PACK) ×2 IMPLANT
YANKAUER SUCT BULB TIP 10FT TU (MISCELLANEOUS) ×1 IMPLANT

## 2015-09-18 NOTE — Anesthesia Procedure Notes (Signed)
Procedure Name: Intubation Date/Time: 09/18/2015 3:44 PM Performed by: Cynda Familia Pre-anesthesia Checklist: Patient identified, Emergency Drugs available, Suction available and Patient being monitored Patient Re-evaluated:Patient Re-evaluated prior to inductionOxygen Delivery Method: Circle System Utilized Preoxygenation: Pre-oxygenation with 100% oxygen Intubation Type: IV induction Ventilation: Mask ventilation without difficulty Laryngoscope Size: Miller and 2 Grade View: Grade I Tube type: Oral Tube size: 6.5 mm Number of attempts: 1 Airway Equipment and Method: Stylet Placement Confirmation: ETT inserted through vocal cords under direct vision,  positive ETCO2 and breath sounds checked- equal and bilateral Secured at: 21 cm Tube secured with: Tape Dental Injury: Teeth and Oropharynx as per pre-operative assessment  Comments: Smooth IV induction--IV Roc- Rose--- intubation AM CRNA- atraumatic-- teeth and mouth as preop- bilat BS

## 2015-09-18 NOTE — Interval H&P Note (Signed)
History and Physical Interval Note:  09/18/2015 1:56 PM  Kayla [name]  has presented today for surgery, with the diagnosis of RIGHT HIP PAINFUL ARTHROPLASTY   The various methods of treatment have been discussed with the patient and family. After consideration of risks, benefits and other options for treatment, the patient has consented to  Procedure(s): CONVERSION OF RIGHT HEMI ARTHROPLASTY TO TOTAL HIP ARTHROPLASTY ACETABULAR REVISON  (Right) as a surgical intervention .  [name] history has been reviewed, patient examined, no change in status, stable for surgery.  I have reviewed the patient's chart and labs.  Questions were answered to the patient's satisfaction.     Mauri Pole

## 2015-09-18 NOTE — Brief Op Note (Signed)
09/18/2015  4:57 PM  PATIENT:  Kayla [name]  65 y.o. female  PRE-OPERATIVE DIAGNOSIS:  Failed right hip hemiarthroplasty, acetabular osteoarthritic progression  POST-OPERATIVE DIAGNOSIS:   Failed right hip hemiarthroplasty, acetabular osteoarthritic progression  PROCEDURE:  Procedure(s): CONVERSION OF RIGHT HEMI ARTHROPLASTY TO TOTAL HIP ARTHROPLASTY ACETABULAR REVISON  (Right) Depuy  SURGEON:  Surgeon(s) and Role:    * Paralee Cancel, MD - Primary  PHYSICIAN ASSISTANT: Danae Orleans, PA-C  ANESTHESIA:   general  EBL:  Total I/O In: 1000 [I.V.:1000] Out: 67 [Urine:900; Blood:175]  BLOOD ADMINISTERED:none  DRAINS: none   LOCAL MEDICATIONS USED:  NONE  SPECIMEN:  No Specimen  DISPOSITION OF SPECIMEN:  N/A  COUNTS:  YES  TOURNIQUET:  * No tourniquets in log *  DICTATION: .Other Dictation: Dictation Number [id number]  PLAN OF CARE: Admit to inpatient   PATIENT DISPOSITION:  PACU - hemodynamically stable.   Delay start of Pharmacological VTE agent (>24hrs) due to surgical blood loss or risk of bleeding: no

## 2015-09-18 NOTE — Anesthesia Preprocedure Evaluation (Addendum)
Anesthesia Evaluation  Patient identified by MRN, date of birth, ID band Patient awake    Reviewed: Allergy & Precautions, NPO status , Patient's Chart, lab work & pertinent test results  Airway Mallampati: II  TM Distance: >3 FB Neck ROM: Full   Comment: Pt sts that her vocal cords are uneven- she is hoarse and at times exhibits stridor without any resp distress- she sts that this is normal for her- last intubation with 6.5 ETT Dental no notable dental hx. (+) Dental Advisory Given Uneven surface on front teeth:   Pulmonary sleep apnea and Continuous Positive Airway Pressure Ventilation , COPD,    breath sounds clear to auscultation + decreased breath sounds      Cardiovascular negative cardio ROS Normal cardiovascular exam Rhythm:Regular Rate:Normal     Neuro/Psych MS negative psych ROS   GI/Hepatic negative GI ROS, Neg liver ROS,   Endo/Other  Hypothyroidism   Renal/GU negative Renal ROS  negative genitourinary   Musculoskeletal negative musculoskeletal ROS (+)   Abdominal   Peds negative pediatric ROS (+)  Hematology negative hematology ROS (+)   Anesthesia Other Findings   Reproductive/Obstetrics negative OB ROS                          Anesthesia Physical Anesthesia Plan  ASA: III  Anesthesia Plan: General   Post-op Pain Management:    Induction: Intravenous  Airway Management Planned: Oral ETT  Additional Equipment:   Intra-op Plan:   Post-operative Plan: Extubation in OR  Informed Consent: I have reviewed the patients History and Physical, chart, labs and discussed the procedure including the risks, benefits and alternatives for the proposed anesthesia with the patient or authorized representative who has indicated his/her understanding and acceptance.   Dental advisory given  Plan Discussed with: CRNA and Surgeon  Anesthesia Plan Comments:         Anesthesia  Quick Evaluation

## 2015-09-18 NOTE — Anesthesia Postprocedure Evaluation (Signed)
Anesthesia Post Note  Patient: [name]  Procedure(s) Performed: Procedure(s) (LRB): CONVERSION OF RIGHT HEMI ARTHROPLASTY TO TOTAL HIP ARTHROPLASTY ACETABULAR REVISON  (Right)  Patient location during evaluation: PACU Anesthesia Type: General Level of consciousness: awake and alert Pain management: pain level controlled Vital Signs Assessment: post-procedure vital signs reviewed and stable Respiratory status: spontaneous breathing, nonlabored ventilation, respiratory function stable and patient connected to nasal cannula oxygen Cardiovascular status: blood pressure returned to baseline and stable Postop Assessment: no signs of nausea or vomiting Anesthetic complications: no    Last Vitals:  Filed Vitals:   09/18/15 1800 09/18/15 1819  BP:  99/54  Pulse: 72 72  Temp: 36.7 C 36.3 C  Resp: 13 15    Last Pain:  Filed Vitals:   09/18/15 1819  PainSc: 6                  Nikolina Simerson S

## 2015-09-18 NOTE — Transfer of Care (Signed)
Immediate Anesthesia Transfer of Care Note  Patient: Kayla [name]  Procedure(s) Performed: Procedure(s): CONVERSION OF RIGHT HEMI ARTHROPLASTY TO TOTAL HIP ARTHROPLASTY ACETABULAR REVISON  (Right)  Patient Location: PACU  Anesthesia Type:General  Level of Consciousness: awake and alert   Airway & Oxygen Therapy: Patient Spontanous Breathing and Patient connected to face mask oxygen  Post-op Assessment: Report given to RN and Post -op Vital signs reviewed and stable  Post vital signs: Reviewed and stable  Last Vitals:  Filed Vitals:   09/18/15 1255  BP: 123/57  Pulse: 81  Temp: 37.1 C  Resp: 16    Last Pain:  Filed Vitals:   09/18/15 1325  PainSc: 2       Patients Stated Pain Goal: 4 ([id number])  Complications: No apparent anesthesia complications

## 2015-09-19 LAB — BASIC METABOLIC PANEL
Anion gap: 7 (ref 5–15)
BUN: 12 mg/dL (ref 6–20)
CO2: 24 mmol/L (ref 22–32)
Calcium: 8.1 mg/dL — ABNORMAL LOW (ref 8.9–10.3)
Chloride: 101 mmol/L (ref 101–111)
Creatinine, Ser: 0.54 mg/dL (ref 0.44–1.00)
GFR calc Af Amer: >60 mL/min (ref >60)
GFR calc non Af Amer: >60 mL/min (ref >60)
Glucose, Bld: 101 mg/dL — ABNORMAL HIGH (ref 65–99)
Potassium: 4.7 mmol/L (ref 3.5–5.1)
Sodium: 132 mmol/L — ABNORMAL LOW (ref 135–145)

## 2015-09-19 LAB — CBC
HCT: 31.9 % — ABNORMAL LOW (ref 36.0–46.0)
Hemoglobin: 10.7 g/dL — ABNORMAL LOW (ref 12.0–15.0)
MCH: 29.7 pg (ref 26.0–34.0)
MCHC: 33.5 g/dL (ref 30.0–36.0)
MCV: 88.6 fL (ref 78.0–100.0)
Platelets: 302 10*3/uL (ref 150–400)
RBC: 3.6 MIL/uL — ABNORMAL LOW (ref 3.87–5.11)
RDW: 13 % (ref 11.5–15.5)
WBC: 9.8 10*3/uL (ref 4.0–10.5)

## 2015-09-19 MED ORDER — ACETAMINOPHEN 500 MG PO TABS: 1000.0000 mg | ORAL_TABLET | Freq: Three times a day (TID) | ORAL | Status: DC

## 2015-09-19 MED ORDER — POLYETHYLENE GLYCOL 3350 17 G PO PACK: 17.0000 g | PACK | Freq: Two times a day (BID) | ORAL | Status: DC

## 2015-09-19 MED ORDER — TRAMADOL HCL 50 MG PO TABS: 50.0000 mg | ORAL_TABLET | Freq: Four times a day (QID) | ORAL | Status: DC | PRN

## 2015-09-19 MED ORDER — ASPIRIN 325 MG PO TBEC: 325.0000 mg | DELAYED_RELEASE_TABLET | Freq: Two times a day (BID) | ORAL | Status: AC

## 2015-09-19 MED ORDER — AMPHETAMINE-DEXTROAMPHETAMINE 10 MG PO TABS
20.0000 mg | ORAL_TABLET | Freq: Every day | ORAL | Status: DC
  Administered 2015-09-19 – 2015-09-20 (×2): 20 mg via ORAL
  Filled 2015-09-19 (×2): qty 2

## 2015-09-19 MED ORDER — FERROUS SULFATE 325 (65 FE) MG PO TABS: 325.0000 mg | ORAL_TABLET | Freq: Three times a day (TID) | ORAL | Status: DC

## 2015-09-19 MED ORDER — DOCUSATE SODIUM 100 MG PO CAPS: 100.0000 mg | ORAL_CAPSULE | Freq: Two times a day (BID) | ORAL | Status: DC

## 2015-09-19 NOTE — Discharge Instructions (Signed)

## 2015-09-19 NOTE — Progress Notes (Signed)
Physical Therapy Treatment Patient Details Name: Kayla [name] MRN: [id number] DOB: 1950-09-18 Today's Date: 09/19/2015    History of Present Illness s/p CONVERSION OF RIGHT HEMI ARTHROPLASTY TO TOTAL HIP ARTHROPLASTY ACETABULAR REVISON (Right)    PT Comments    Pt continues motivated and cooperative but progress ltd this pm by nausea - RN aware  Follow Up Recommendations  Home health PT     Equipment Recommendations  Rolling walker with 5" wheels    Recommendations for Other Services OT consult     Precautions / Restrictions Precautions Precautions: Posterior Hip;Fall Precaution Booklet Issued: Yes (comment) Precaution Comments: posterior hip precautions handout given and reviewed with patient Restrictions Weight Bearing Restrictions: No Other Position/Activity Restrictions: WBAT    Mobility  Bed Mobility Overal bed mobility: Needs Assistance Bed Mobility: Sit to Supine       Sit to supine: Min assist   General bed mobility comments: assist to manage RLE, cues for sequence, use of L LE to self assist and adherenct to posterior THP  Transfers Overall transfer level: Needs assistance Equipment used: Rolling walker (2 wheeled) Transfers: Sit to/from Stand Sit to Stand: Min assist         General transfer comment: cues for UE/LE placement and management, assist to rise and stabilize  Ambulation/Gait Ambulation/Gait assistance: Min assist Ambulation Distance (Feet): 15 Feet Assistive device: Rolling walker (2 wheeled) Gait Pattern/deviations: Step-to pattern;Decreased step length - right;Decreased step length - left;Shuffle;Trunk flexed Gait velocity: decr   General Gait Details: cues for sequence, posture and position from RW - ltd by nausea   Stairs            Wheelchair Mobility    Modified Rankin (Stroke Patients Only)       Balance                                    Cognition Arousal/Alertness: Awake/alert Behavior  During Therapy: WFL for tasks assessed/performed Overall Cognitive Status: Within Functional Limits for tasks assessed                      Exercises      General Comments        Pertinent Vitals/Pain Pain Assessment: 0-10 Pain Score: 6  Pain Location: R hip Pain Descriptors / Indicators: Aching;Sore Pain Intervention(s): Limited activity within patient's tolerance;Monitored during session;Premedicated before session;Ice applied    Home Living                      Prior Function            PT Goals (current goals can now be found in the care plan section) Acute Rehab PT Goals Patient Stated Goal: home eventually PT Goal Formulation: With patient Time For Goal Achievement: 09/24/15 Potential to Achieve Goals: Good Progress towards PT goals: Progressing toward goals    Frequency  7X/week    PT Plan Current plan remains appropriate    Co-evaluation             End of Session Equipment Utilized During Treatment: Gait belt Activity Tolerance: Other (comment) (ltd by nausea) Patient left: in bed;with call bell/phone within reach;with nursing/sitter in room;with family/visitor present     Time: 1359-1418 PT Time Calculation (min) (ACUTE ONLY): 19 min  Charges:  $Gait Training: 8-22 mins  G Codes:      Kayla [name] 09-27-2015, 3:24 PM

## 2015-09-19 NOTE — Evaluation (Signed)
Physical Therapy Evaluation Patient Details Name: Kayla [name] MRN: [id number] DOB: 28-Sep-1950 Today's Date: 09/19/2015   History of Present Illness  s/p CONVERSION OF RIGHT HEMI ARTHROPLASTY TO TOTAL HIP ARTHROPLASTY ACETABULAR REVISON (Right)  Clinical Impression  Pt s/p R THR presents with decreased R LE strength/ROM, post op pain, and posterior THP limiting functional mobility.  Pt should progress to dc home with family assist and HHPT follow up.    Follow Up Recommendations Home health PT    Equipment Recommendations  Rolling walker with 5" wheels    Recommendations for Other Services OT consult     Precautions / Restrictions Precautions Precautions: Posterior Hip;Fall Precaution Booklet Issued: Yes (comment) Precaution Comments: posterior hip precautions handout given and reviewed with patient Restrictions Weight Bearing Restrictions: No Other Position/Activity Restrictions: WBAT      Mobility  Bed Mobility Overal bed mobility: Needs Assistance Bed Mobility: Supine to Sit     Supine to sit: Min assist;+2 for safety/equipment;HOB elevated     General bed mobility comments: assist to manage RLE, stabilize  Transfers Overall transfer level: Needs assistance Equipment used: Rolling walker (2 wheeled) Transfers: Sit to/from Stand Sit to Stand: Min assist;+2 safety/equipment;From elevated surface         General transfer comment: cues for UE/LE placement and management, assist to rise and stabilize  Ambulation/Gait Ambulation/Gait assistance: Min assist;+2 safety/equipment Ambulation Distance (Feet): 33 Feet Assistive device: Rolling walker (2 wheeled) Gait Pattern/deviations: Step-to pattern;Decreased step length - right;Decreased step length - left;Shuffle;Trunk flexed Gait velocity: decr   General Gait Details: cues for sequence, posture and position from [employer]            Wheelchair Mobility    Modified Rankin (Stroke Patients Only)        Balance                                             Pertinent Vitals/Pain Pain Assessment: 0-10 Pain Score: 7  Pain Location: R hip Pain Descriptors / Indicators: Aching;Sore Pain Intervention(s): Limited activity within patient's tolerance;Monitored during session;Premedicated before session;Ice applied;Patient requesting pain meds-RN notified    Home Living Family/patient expects to be discharged to:: Private residence Living Arrangements: Spouse/significant other Available Help at Discharge: Family Type of Home: House Home Access: Stairs to enter Entrance Stairs-Rails: Right Entrance Stairs-Number of Steps: 4 Home Layout: One level Home Equipment: Icard - single point;Walker - 4 wheels;Shower seat;Adaptive equipment      Prior Function Level of Independence: Independent;Independent with assistive device(s)         Comments: used Rollator outside     Hand Dominance        Extremity/Trunk Assessment   Upper Extremity Assessment: Overall WFL for tasks assessed           Lower Extremity Assessment: RLE deficits/detail RLE Deficits / Details: Strength at hip 2/5 with AAROM at hip to 85 flex and 15 abd    Cervical / Trunk Assessment: Kyphotic  Communication   Communication: No difficulties  Cognition Arousal/Alertness: Awake/alert Behavior During Therapy: WFL for tasks assessed/performed Overall Cognitive Status: Within Functional Limits for tasks assessed                      General Comments      Exercises Total Joint Exercises Ankle Circles/Pumps: AROM;Both;15 reps;Supine Quad Sets: AROM;Both;10 reps;Supine Heel Slides:  AAROM;15 reps;Supine;Right Hip ABduction/ADduction: AAROM;Right;10 reps;Supine      Assessment/Plan    PT Assessment Patient needs continued PT services  PT Diagnosis Difficulty walking   PT Problem List Decreased strength;Decreased range of motion;Decreased activity tolerance;Decreased  mobility;Decreased knowledge of use of DME;Pain  PT Treatment Interventions DME instruction;Gait training;Stair training;Functional mobility training;Therapeutic activities;Therapeutic exercise;Patient/family education   PT Goals (Current goals can be found in the Care Plan section) Acute Rehab PT Goals Patient Stated Goal: home eventually PT Goal Formulation: With patient Time For Goal Achievement: 09/24/15 Potential to Achieve Goals: Good    Frequency 7X/week   Barriers to discharge        Co-evaluation PT/OT/SLP Co-Evaluation/Treatment: Yes Reason for Co-Treatment: For patient/therapist safety PT goals addressed during session: Mobility/safety with mobility OT goals addressed during session: ADL's and self-care       End of Session Equipment Utilized During Treatment: Gait belt Activity Tolerance: Patient tolerated treatment well;Patient limited by pain Patient left: in chair;with call bell/phone within reach;with family/visitor present Nurse Communication: Mobility status         Time: [id number] PT Time Calculation (min) (ACUTE ONLY): 32 min   Charges:   PT Evaluation $PT Eval Low Complexity: 1 Procedure PT Treatments $Therapeutic Exercise: 8-22 mins   PT G Codes:        Pasquale Matters 09/20/2015, 12:48 PM

## 2015-09-19 NOTE — Progress Notes (Signed)
Occupational Therapy Evaluation Patient Details Name: Kayla [name] MRN: [id number] DOB: 26-Jun-1950 Today's Date: 09/19/2015    History of Present Illness s/p CONVERSION OF RIGHT HEMI ARTHROPLASTY TO TOTAL HIP ARTHROPLASTY ACETABULAR REVISON (Right)   Clinical Impression   Patient presents to OT with decreased ADL independence and safety s/p above procedure. Will benefit from skilled OT to maximize function and to facilitate a safe discharge.    Follow Up Recommendations  No OT follow up;Supervision/Assistance - 24 hour    Equipment Recommendations  3 in 1 bedside comode    Recommendations for Other Services       Precautions / Restrictions Precautions Precautions: Posterior Hip;Fall Precaution Booklet Issued: Yes (comment) Precaution Comments: posterior hip precautions handout given and reviewed with patient Restrictions Weight Bearing Restrictions: No Other Position/Activity Restrictions: WBAT      Mobility Bed Mobility Overal bed mobility: Needs Assistance Bed Mobility: Supine to Sit     Supine to sit: Min assist;+2 for safety/equipment;HOB elevated     General bed mobility comments: assist to manage RLE, stabilize  Transfers Overall transfer level: Needs assistance Equipment used: Rolling walker (2 wheeled) Transfers: Sit to/from Stand Sit to Stand: Min assist;+2 safety/equipment;From elevated surface         General transfer comment: cues for UE/LE placement and management, assist to rise and stabilize    Balance                                            ADL Overall ADL's : Needs assistance/impaired Eating/Feeding: Set up;Sitting   Grooming: Set up;Sitting   Upper Body Bathing: Minimal assitance;Sitting   Lower Body Bathing: Maximal assistance;Sit to/from stand   Upper Body Dressing : Minimal assistance;Sitting   Lower Body Dressing: Maximal assistance;Sit to/from stand       Toileting- Water quality scientist and  Hygiene: Total assistance Toileting - Clothing Manipulation Details (indicate cue type and reason): still has foley catheter     Functional mobility during ADLs: Minimal assistance;+2 for safety/equipment;Rolling walker;Cueing for sequencing General ADL Comments: Patient education on posterior hip precautions and ADL implications. Has AE from previous surgery, except she gave away her reacher. Plans to purchase another one. OT will follow to review AE use for LB self-care and ADL transfers.     Vision     Perception     Praxis      Pertinent Vitals/Pain Pain Assessment: 0-10 Pain Score: 7  Pain Location: R hip Pain Descriptors / Indicators: Aching;Sore Pain Intervention(s): Limited activity within patient's tolerance;Monitored during session;Repositioned;Ice applied     Hand Dominance     Extremity/Trunk Assessment Upper Extremity Assessment Upper Extremity Assessment: Overall WFL for tasks assessed   Lower Extremity Assessment Lower Extremity Assessment: Defer to PT evaluation       Communication Communication Communication: No difficulties   Cognition Arousal/Alertness: Awake/alert Behavior During Therapy: WFL for tasks assessed/performed Overall Cognitive Status: Within Functional Limits for tasks assessed                     General Comments       Exercises       Shoulder Instructions      Home Living Family/patient expects to be discharged to:: Private residence Living Arrangements: Spouse/significant other Available Help at Discharge: Family Type of Home: House Home Access: Stairs to enter [employer] of Steps: 4 Entrance Stairs-Rails: Right  Home Layout: One level     Bathroom Shower/Tub: Occupational psychologist: Handicapped height Bathroom Accessibility: Yes How Accessible: Accessible via walker Home Equipment: Cane - single point;Walker - 4 wheels;Shower seat;Adaptive equipment Adaptive Equipment: Sock  aid;Long-handled shoe horn;Long-handled sponge;Other (Comment) (leg lifter)        Prior Functioning/Environment Level of Independence: Independent;Independent with assistive device(s)        Comments: used Rollator outside    OT Diagnosis: Acute pain   OT Problem List: Decreased strength;Decreased range of motion;Decreased activity tolerance;Decreased knowledge of use of DME or AE;Decreased knowledge of precautions;Pain   OT Treatment/Interventions: Self-care/ADL training;DME and/or AE instruction;Therapeutic activities;Patient/family education    OT Goals(Current goals can be found in the care plan section) Acute Rehab OT Goals Patient Stated Goal: home eventually OT Goal Formulation: With patient Time For Goal Achievement: 10/03/15 Potential to Achieve Goals: Good ADL Goals Pt Will Perform Lower Body Bathing: with supervision;with adaptive equipment;sit to/from stand Pt Will Perform Lower Body Dressing: with supervision;with adaptive equipment;sit to/from stand Pt Will Transfer to Toilet: with supervision;ambulating;bedside commode Pt Will Perform Toileting - Clothing Manipulation and hygiene: with supervision;sit to/from stand Pt Will Perform Tub/Shower Transfer: Shower transfer;with min guard assist;shower seat;grab bars  OT Frequency: Min 2X/week   Barriers to D/C:            Co-evaluation              End of Session Equipment Utilized During Treatment: Surveyor, mining Communication: Mobility status  Activity Tolerance: Patient tolerated treatment well Patient left: in chair;with call bell/phone within reach;with chair alarm set;with family/visitor present   Time: [id number] OT Time Calculation (min): 17 min Charges:  OT General Charges $OT Visit: 1 Procedure OT Evaluation $OT Eval Low Complexity: 1 Procedure G-Codes:    Juanluis Guastella A 09/26/15, 11:53 AM

## 2015-09-19 NOTE — Progress Notes (Signed)
     Subjective: 1 Day Post-Op Procedure(s) (LRB): CONVERSION OF RIGHT HEMI ARTHROPLASTY TO TOTAL HIP ARTHROPLASTY ACETABULAR REVISON  (Right)   Seen by Dr. Alvan Dame. Patient reports pain as mild, pain controlled. No events throughout the night. Ready to be discharged home, if she does well with PT.  Objective:   VITALS:   Filed Vitals:   09/19/15 0203 09/19/15 0532  BP: 92/49 100/60  Pulse: 67 68  Temp: 97.5 F (36.4 C) 97.8 F (36.6 C)  Resp:  16    Dorsiflexion/Plantar flexion intact Incision: dressing C/D/I No cellulitis present Compartment soft  LABS  Recent Labs  09/19/15 0418  HGB 10.7*  HCT 31.9*  WBC 9.8  PLT 302     Recent Labs  09/19/15 0418  NA 132*  K 4.7  BUN 12  CREATININE 0.54  GLUCOSE 101*     Assessment/Plan: 1 Day Post-Op Procedure(s) (LRB): CONVERSION OF RIGHT HEMI ARTHROPLASTY TO TOTAL HIP ARTHROPLASTY ACETABULAR REVISON  (Right) Foley cath d/c'ed Advance diet Up with therapy D/C IV fluids Discharge home with home health  Follow up in 2 weeks at [facility]. Follow up with OLIN,Jaythen Hamme D in 2 weeks.  Contact information:  [facility] [address] [place] Arshad Oberholzer   PAC  09/19/2015, 9:35 AM

## 2015-09-19 NOTE — Op Note (Signed)
NAMEKIMRA, LEIBFRIED NO.:  [id number]  MEDICAL RECORD NO.:  [id number]  LOCATION:  [id number]                         FACILITY:  [facility]  PHYSICIAN:  Pietro Cassis. Alvan Dame, M.D.  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  FINDINGS:  Patient was noted to have obvious loss of acetabular cartilage circumferentially with associated sclerotic subchondral bone from the stress placed upon it.  PROCEDURE:  Conversion of failed right hip hemiarthroplasty to a right total hip arthroplasty utilizing DePuy size 52 mm Pinnacle shell, 36+ 4 neutral liner and 36+ 5 Delta ceramic ball.  SURGEON:  Pietro Cassis. Alvan Dame, MD  ASSISTANT:  Danae Orleans, PA-C.  Note that, Mr. Guinevere Scarlet was present for the entirety of the case from preoperative positioning and perioperative management of the operative extremity, general facilitation of the case and primary wound closure.  ANESTHESIA:  General.  SPECIMENS:  None.  COMPLICATIONS:  None.  BLOOD LOSS:  About 150 mL.  DRAINS:  None.  INDICATIONS FOR PROCEDURE:  Ms. Lips is a very pleasant 65 year old patient of mine with previous femoral neck fracture treated with a right hip hemiarthroplasty.  Her medical comorbidities include multiple sclerosis.  She had progressed well over the 1st few years of her hip hemiarthroplasty, but has recently developed progressive pain with weightbearing activity in the groin.  Radiographic assessment indicated stable femoral component without any seen complication and no severe acetabular complications though obvious loss of subchondral articular cartilage present.  We discussed options for management.  She did not  feel that she tolerated continuing to go on without addressing this.  The hip pain in the groin was affecting her overall quality of life and function.  We reviewed the risks of persistent pain, postop, the risks of infection, DVT, component failure, need for future revision surgery.  Consent was obtained for benefit of pain relief.  PROCEDURE IN DETAIL:  Patient was brought to the operative theater. Once adequate anesthesia, preoperative antibiotics, Ancef, 1 g of tranexamic acid and Decadron administered, she was positioned into the left lateral decubitus position with right side up.  The right lower extremity was then prepped and draped in sterile fashion.  Her previous right hip incision was identified.  A time-out was performed identifying the patient, planned procedure, and extremity.  Following this, I did incise the skin down to the iliotibial band and gluteal fascia which were incised, posterior approach to the hip.  Posterior aspect of the hip was exposed from the posterior trochanter back with care to protect the neurovascular structures.  Once the hip joint was opened, we encountered decent sized effusion, maybe 20 mL sides of effusion in her hip consistent with her presentation.  Once the posterior aspect of the hip was exposed, I was able to dislocate the hip and then removed the femoral head.  I then placed the trunnion of the femoral component with slight neutral positioning onto the superior ilium and placed a  retractor anteriorly and then placed a retractor inferiorly, there was 1 posterior.  At this point, further evaluation of the acetabular debridement of soft tissues revealed this progressive loss of chondral surfaces.  I began reaming with a 45 mm reamer and reamed up, I elected to ream up to 51 reamer to try to get a good bone due to significant sclerotic nature of her acetabulum.  I was able to get some bleeding bone circumferentially, but I did also drill  holes into the acetabular bone stock in an effort to create a healing response through this. Once this was done, I selected a 52 mm Pinnacle shell, it was then impacted and positioned underneath the anterior rim anteriorly with about 40 degrees of abduction.  I then traced the trial liner and did a trial reduction 1st with 36+ 1.5 ball and then with a +5 ball.  With this, I found that the combined anteversion was about 50 degrees.  There was no evidence of any subluxation.  Her leg lengths appeared to be comparable to the down leg. Given all these findings, I removed the trial liners and ball, replaced the trunnion onto the ilium and retractors were placed.  The acetabular liner was removed.  Two screws were placed in the ilium to allow for early mobilization in the setting of the sclerotic acetabulum.  The final 36+ 4 neutral AltrX liner was then impacted with good visualized rim fit.  Based on trial reductions, I selected a 36 +5 ceramic ball.  This was then impacted on the clean and dried trunnion and the hip was reduced.  The hip was irrigated throughout the case again at this point.  Based on the initial dissection, there was no posterior capsular repair. I then reapproximated the iliotibial band and gluteal fascia using #1 Vicryl and then a 0 V-Loc suture.  The remaining of the wound was closed with 2-0 Vicryl and a running 3-0 Monocryl.  The hip was then cleaned, dried, and dressed sterilely using surgical glue and Aquacel dressing. She tolerated the procedure well.  Findings were reviewed with family. She will be in the hospital perhaps 1-2 nighttime with physical therapy as needed for progressive activity.     Pietro Cassis Alvan Dame, M.D.     MDO/MEDQ  D:  09/18/2015  T:  09/19/2015  Job:  [id number]

## 2015-09-19 NOTE — Care Management Note (Signed)
Case Management Note  Patient Details  Name: Kayla [name] MRN: [id number] Date of Birth: Jun 23, 1950  Subjective/Objective:                  CONVERSION OF RIGHT HEMI ARTHROPLASTY TO TOTAL HIP ARTHROPLASTY ACETABULAR REVISON (Right) Action/Plan: Discharge planning Expected Discharge Date:  09/19/15               Expected Discharge Plan:  [place]  In-House Referral:     Discharge planning Services  CM Consult  Post Acute Care Choice:    Choice offered to:  Patient  DME Arranged:  Walker rolling DME Agency:  Liborio Negron Torres:  PT [facility] Agency:     Status of Service:  Completed, signed off  If discussed at [place] of Stay Meetings, dates discussed:    Additional Comments: CM met with pt in room to offer choice of home health agency.  Pt chooses [facility] to render HHPT.  Referral called to [employer], Tim.  CM called [facility] DME rep, Germaine to please deliver the rolling walker to room so pt can discharge.  No other CM needs were communicated.  Dellie Catholic, RN 09/19/2015, 11:10 AM

## 2015-09-20 LAB — BASIC METABOLIC PANEL
Anion gap: 5 (ref 5–15)
BUN: 12 mg/dL (ref 6–20)
CO2: 30 mmol/L (ref 22–32)
Calcium: 8.7 mg/dL — ABNORMAL LOW (ref 8.9–10.3)
Chloride: 94 mmol/L — ABNORMAL LOW (ref 101–111)
Creatinine, Ser: 0.55 mg/dL (ref 0.44–1.00)
GFR calc Af Amer: >60 mL/min (ref >60)
GFR calc non Af Amer: >60 mL/min (ref >60)
Glucose, Bld: 121 mg/dL — ABNORMAL HIGH (ref 65–99)
Potassium: 3.4 mmol/L — ABNORMAL LOW (ref 3.5–5.1)
Sodium: 129 mmol/L — ABNORMAL LOW (ref 135–145)

## 2015-09-20 LAB — CBC
HCT: 29.1 % — ABNORMAL LOW (ref 36.0–46.0)
Hemoglobin: 10 g/dL — ABNORMAL LOW (ref 12.0–15.0)
MCH: 30.8 pg (ref 26.0–34.0)
MCHC: 34.4 g/dL (ref 30.0–36.0)
MCV: 89.5 fL (ref 78.0–100.0)
Platelets: 332 10*3/uL (ref 150–400)
RBC: 3.25 MIL/uL — ABNORMAL LOW (ref 3.87–5.11)
RDW: 13 % (ref 11.5–15.5)
WBC: 8.3 10*3/uL (ref 4.0–10.5)

## 2015-09-20 MED ORDER — SODIUM CHLORIDE 0.9 % IV BOLUS (SEPSIS)
250.0000 mL | Freq: Once | INTRAVENOUS | Status: AC
  Administered 2015-09-20: 250 mL via INTRAVENOUS

## 2015-09-20 MED ORDER — POTASSIUM CHLORIDE CRYS ER 20 MEQ PO TBCR
40.0000 meq | EXTENDED_RELEASE_TABLET | Freq: Once | ORAL | Status: AC
  Administered 2015-09-20: 40 meq via ORAL
  Filled 2015-09-20: qty 2

## 2015-09-20 MED ORDER — PROMETHAZINE HCL 25 MG/ML IJ SOLN: 12.5000 mg | Freq: Four times a day (QID) | INTRAMUSCULAR | Status: DC | PRN

## 2015-09-20 MED ORDER — SODIUM CHLORIDE 0.9 % IV SOLN
INTRAVENOUS | Status: DC
  Administered 2015-09-20: 01:00:00 via INTRAVENOUS

## 2015-09-20 NOTE — Progress Notes (Signed)
Physical Therapy Treatment Patient Details Name: Kayla [name] MRN: [id number] DOB: Dec 09, 1950 Today's Date: 09/20/2015    History of Present Illness s/p CONVERSION OF RIGHT HEMI ARTHROPLASTY TO TOTAL HIP ARTHROPLASTY ACETABULAR REVISON (Right)    PT Comments    POD # 2 Assisted with performing all standing THR TE's and amb a greater distance in hallway.  Pt ready for D/C to home.   Follow Up Recommendations  Home health PT     Equipment Recommendations  Rolling walker with 5" wheels    Recommendations for Other Services       Precautions / Restrictions Precautions Precautions: Posterior Hip;Fall Precaution Booklet Issued: Yes (comment) Precaution Comments: reviewed all posterior hip precautions and pt verbalized understanding.  handout given  Restrictions Weight Bearing Restrictions: No Other Position/Activity Restrictions: WBAT    Mobility  Bed Mobility Overal bed mobility: Needs Assistance Bed Mobility: Supine to Sit     Supine to sit: Supervision;HOB elevated     General bed mobility comments: OOB in recliner  Transfers Overall transfer level: Needs assistance Equipment used: Rolling walker (2 wheeled) Transfers: Sit to/from Stand Sit to Stand: Supervision         General transfer comment: increased time  Ambulation/Gait Ambulation/Gait assistance: Min guard Ambulation Distance (Feet): 87 Feet Assistive device: Rolling walker (2 wheeled) Gait Pattern/deviations: Step-to pattern Gait velocity: decr   General Gait Details: tolerated increased distance   Stairs            Wheelchair Mobility    Modified Rankin (Stroke Patients Only)       Balance                                    Cognition Arousal/Alertness: Awake/alert Behavior During Therapy: WFL for tasks assessed/performed Overall Cognitive Status: Within Functional Limits for tasks assessed                      Exercises      General Comments         Pertinent Vitals/Pain Pain Assessment: 0-10 Pain Score: 3  Pain Location: R hip  Pain Descriptors / Indicators: Sore;Tender Pain Intervention(s): Monitored during session;Premedicated before session;Repositioned;Ice applied    Home Living                      Prior Function            PT Goals (current goals can now be found in the care plan section) Progress towards PT goals: Progressing toward goals    Frequency  7X/week    PT Plan Current plan remains appropriate    Co-evaluation             End of Session Equipment Utilized During Treatment: Gait belt Activity Tolerance: Patient tolerated treatment well Patient left: in chair;with call bell/phone within reach;with chair alarm set     Time: [id number] PT Time Calculation (min) (ACUTE ONLY): 25 min  Charges:  $Gait Training: 8-22 mins $Therapeutic Exercise: 8-22 mins                    G Codes:      Rica Koyanagi  PTA [facility]  Acute  Rehab Pager      [phone]

## 2015-09-20 NOTE — Progress Notes (Signed)
Discharge instructions given to pt, verbalized understanding. Left the unit in stable condition. 

## 2015-09-20 NOTE — Progress Notes (Signed)
Occupational Therapy Treatment Patient Details Name: Kayla [name] MRN: [id number] DOB: 12-09-1950 Today's Date: 09/20/2015    History of present illness s/p CONVERSION OF RIGHT HEMI ARTHROPLASTY TO TOTAL HIP ARTHROPLASTY ACETABULAR REVISON (Right)   OT comments  Patient feeling much better today. All OT education completed and pt questions answered. No further OT needs. Will sign off.   Follow Up Recommendations  No OT follow up;Supervision/Assistance - 24 hour    Equipment Recommendations  None recommended by OT    Recommendations for Other Services      Precautions / Restrictions Precautions Precautions: Posterior Hip;Fall Precaution Booklet Issued: Yes (comment) Precaution Comments: reviewed all posterior hip precautions and pt verbalized understanding Restrictions Weight Bearing Restrictions: No Other Position/Activity Restrictions: WBAT       Mobility Bed Mobility Overal bed mobility: Needs Assistance Bed Mobility: Supine to Sit     Supine to sit: Supervision;HOB elevated     General bed mobility comments: no physical assistance needed  Transfers Overall transfer level: Needs assistance Equipment used: Rolling walker (2 wheeled) Transfers: Sit to/from Stand Sit to Stand: Supervision              Balance                                   ADL Overall ADL's : Needs assistance/impaired     Grooming: Oral care;Supervision/safety;Standing           Upper Body Dressing : Set up;Sitting   Lower Body Dressing: Supervision/safety;Adhering to hip precautions;With adaptive equipment;Sit to/from stand           Tub/ Shower Transfer: Walk-in shower;Supervision/safety;Cueing for sequencing;Shower seat;Grab bars;Rolling walker   Functional mobility during ADLs: Supervision/safety;Rolling walker General ADL Comments: Patient practiced grooming in standing, getting dressed, use of AE, hip precaution education, and walk-in shower transfer  technique during session. All questions answered. Patient up to recliner at end of session.      Vision                     Perception     Praxis      Cognition   Behavior During Therapy: [facility] for tasks assessed/performed Overall Cognitive Status: Within Functional Limits for tasks assessed                       Extremity/Trunk Assessment               Exercises     Shoulder Instructions       General Comments  Noted L hand edema -- pt reports it happened earlier and was due to IV which nurse has since removed from pt's L arm.    Pertinent Vitals/ Pain       Pain Assessment: 0-10 Pain Score: 3  Pain Location: R hip Pain Descriptors / Indicators: Aching;Sore Pain Intervention(s): Monitored during session;Repositioned;Ice applied  Home Living                                          Prior Functioning/Environment              Frequency       Progress Toward Goals  OT Goals(current goals can now be found in the care plan section)  Progress towards OT goals: Goals met/education completed,  patient discharged from [facility] goals met and education completed, patient discharged from OT services    Co-evaluation                 End of Session Equipment Utilized During Treatment: Rolling walker   Activity Tolerance Patient tolerated treatment well   Patient Left in chair;with call bell/phone within reach   Nurse Communication Mobility status        Time: 5974-7185 OT Time Calculation (min): 19 min  Charges: OT General Charges $OT Visit: 1 Procedure OT Treatments $Self Care/Home Management : 8-22 mins  Deserae Jennings A 09/20/2015, 10:05 AM

## 2015-09-20 NOTE — Progress Notes (Signed)
     Subjective: 2 Days Post-Op Procedure(s) (LRB): CONVERSION OF RIGHT HEMI ARTHROPLASTY TO TOTAL HIP ARTHROPLASTY ACETABULAR REVISON  (Right)   Patient reports pain as mild, pain well controlled. States that the tramadol was causing issues and doesn't want it.  APAP doing well at controlling pain.  Some nausea last night, pt feel medication induced, otherwise no events throughout the night. Worked well with PT today.  Ready to be discharged home.  Objective:   VITALS:   Filed Vitals:   09/19/15 2133 09/20/15 0616  BP: 114/50 102/53  Pulse: 65 64  Temp: 99 F (37.2 C) 98.7 F (37.1 C)  Resp: 16 16    Dorsiflexion/Plantar flexion intact Incision: dressing C/D/I No cellulitis present Compartment soft  LABS  Recent Labs  09/19/15 0418 09/20/15 0418  HGB 10.7* 10.0*  HCT 31.9* 29.1*  WBC 9.8 8.3  PLT 302 332     Recent Labs  09/19/15 0418 09/20/15 0418  NA 132* 129*  K 4.7 3.4*  BUN 12 12  CREATININE 0.54 0.55  GLUCOSE 101* 121*     Assessment/Plan: 2 Days Post-Op Procedure(s) (LRB): CONVERSION OF RIGHT HEMI ARTHROPLASTY TO TOTAL HIP ARTHROPLASTY ACETABULAR REVISON  (Right) States that the tramadol was causing issues and doesn't want it.  APAP doing well at controlling pain. Up with therapy Discharge home with home health  Follow up in 2 weeks at [facility]. Follow up with OLIN,Danilyn Cocke D in 2 weeks.  Contact information:  [facility] [address], [address] [place] [phone]    Hypokalemia Treated with oral potassium, discussed using potassium at home.        Kayla [name]   PAC  09/20/2015, 10:33 AM

## 2015-09-20 NOTE — Progress Notes (Signed)
Physical Therapy Treatment Patient Details Name: Kayla [name] MRN: [id number] DOB: March 28, 1950 Today's Date: 09/20/2015    History of Present Illness s/p CONVERSION OF RIGHT HEMI ARTHROPLASTY TO TOTAL HIP ARTHROPLASTY ACETABULAR REVISON (Right)    PT Comments    POD # 2 Pt reports poor sleep and nausea last night.  OOB dressed and in recliner this morning eager to D/C to home. Assisted with amb a greater distance in hallway then practiced stairs using one rail and one cane.    Follow Up Recommendations  Home health PT     Equipment Recommendations  Rolling walker with 5" wheels    Recommendations for Other Services       Precautions / Restrictions Precautions Precautions: Posterior Hip;Fall Precaution Booklet Issued: Yes (comment) Precaution Comments: reviewed all posterior hip precautions and pt verbalized understanding.  handout given  Restrictions Weight Bearing Restrictions: No Other Position/Activity Restrictions: WBAT    Mobility  Bed Mobility Overal bed mobility: Needs Assistance Bed Mobility: Supine to Sit     Supine to sit: Supervision;HOB elevated     General bed mobility comments: OOB in recliner  Transfers Overall transfer level: Needs assistance Equipment used: Rolling walker (2 wheeled) Transfers: Sit to/from Stand Sit to Stand: Supervision         General transfer comment: increased time  Ambulation/Gait Ambulation/Gait assistance: Min guard Ambulation Distance (Feet): 65 Feet Assistive device: Rolling walker (2 wheeled) Gait Pattern/deviations: Step-to pattern Gait velocity: decr   General Gait Details: tolerated increased distance   Stairs            Wheelchair Mobility    Modified Rankin (Stroke Patients Only)       Balance                                    Cognition Arousal/Alertness: Awake/alert Behavior During Therapy: WFL for tasks assessed/performed Overall Cognitive Status: Within Functional  Limits for tasks assessed                      Exercises   Total Hip Replacement TE's 10 reps ankle pumps 10 reps knee presses 10 reps heel slides 10 reps SAQ's 10 reps ABD Followed by [facility]    General Comments        Pertinent Vitals/Pain Pain Assessment: 0-10 Pain Score: 3  Pain Location: R hip  Pain Descriptors / Indicators: Sore;Tender Pain Intervention(s): Monitored during session;Premedicated before session;Repositioned;Ice applied    Home Living                      Prior Function            PT Goals (current goals can now be found in the care plan section) Progress towards PT goals: Progressing toward goals    Frequency  7X/week    PT Plan Current plan remains appropriate    Co-evaluation             End of Session Equipment Utilized During Treatment: Gait belt Activity Tolerance: Patient tolerated treatment well Patient left: in chair;with call bell/phone within reach;with chair alarm set     Time: [id number] PT Time Calculation (min) (ACUTE ONLY): 32 min  Charges:  $Gait Training: 8-22 mins $Therapeutic Exercise: 8-22 mins                    G Codes:  Rica Koyanagi  PTA [facility]  Acute  Rehab Pager      [phone]

## 2015-09-21 NOTE — Progress Notes (Deleted)
Physician Discharge Summary  Patient ID: Kayla [name] MRN: [id number] DOB/AGE: 1951-01-21 65 y.o.  Admit date: 09/18/2015 Discharge date: 09/20/2015   Procedures:  Procedure(s) (LRB): CONVERSION OF RIGHT HEMI ARTHROPLASTY TO TOTAL HIP ARTHROPLASTY ACETABULAR REVISON  (Right)  Attending Physician:  Dr. Paralee Cancel   Admission Diagnoses:   Right hip pain S/P hemiarthroplasty  Discharge Diagnoses:  Principal Problem:   S/P revision right TH  Past Medical History  Diagnosis Date  . Multiple sclerosis ([place])   . Unspecified hypothyroidism   . Other voice and resonance disorders   . Other diseases of vocal cords     vocal cords are not even  . Cervical cancer ([place]) 2005  . Osteoporosis   . Sleep apnea     no cpap used  . COPD (chronic obstructive pulmonary disease) ([place])   . Arthritis     oa  . Pneumonia 2014, 1992    HPI:    Kayla [name], 64 y.o. female, has a history of pain and functional disability in the right hip due to trauma and patient has failed non-surgical conservative treatments for greater than 12 weeks to include NSAID's and/or analgesics, use of assistive devices and activity modification. The indications for the revision total hip arthroplasty are bearing surface wear leading to symptomatic synovitis. Onset of symptoms was abrupt starting in December 2014 with gradually worsening course since that time. Prior procedures on the right hip include hemi-arthroplasty. Patient currently rates pain in the right hip at 6 out of 10 with activity. There is night pain, worsening of pain with activity and weight bearing, trendelenberg gait, pain that interfers with activities of daily living and pain with passive range of motion. Patient has evidence of previous right hip hemiarthroplasty by imaging studies. This condition presents safety issues increasing the risk of falls.  There is no current active infection. Risks, benefits and expectations were discussed with  the patient. Risks including but not limited to the risk of anesthesia, blood clots, nerve damage, blood vessel damage, failure of the prosthesis, infection and up to and including death. Patient understand the risks, benefits and expectations and wishes to proceed with surgery.   PCP: Christinia Gully, MD   Discharged Condition: good  Hospital Course:  Patient underwent the above stated procedure on 09/18/2015. Patient tolerated the procedure well and brought to the recovery room in good condition and subsequently to the floor.  POD #1 BP: 100/60 ; Pulse: 68 ; Temp: 97.8 F (36.6 C) ; Resp: 16 Patient reports pain as mild, pain controlled. No events throughout the night. Dorsiflexion/plantar flexion intact, incision: dressing C/D/I, no cellulitis present and compartment soft.   LABS  Basename    HGB     10.7  HCT     31.9   POD #2  BP: 102/53 ; Pulse: 64 ; Temp: 98.7 F (37.1 C) ; Resp: 16 Patient reports pain as mild, pain well controlled. States that the tramadol was causing issues and doesn't want it. APAP doing well at controlling pain. Some nausea last night, pt feel medication induced, otherwise no events throughout the night. Worked well with PT today. Ready to be discharged home. Dorsiflexion/plantar flexion intact, incision: dressing C/D/I, no cellulitis present and compartment soft.   LABS  Basename    HGB     10.0  HCT     29.1    Discharge Exam: General appearance: alert, cooperative and no distress Extremities: Homans sign is negative, no sign of DVT, no edema,  redness or tenderness in the calves or thighs and no ulcers, gangrene or trophic changes  Disposition: Home with follow up in 2 weeks   Follow-up Information    Follow up with Mauri Pole, MD. Schedule an appointment as soon as possible for a visit in 2 weeks.   Specialty:  Orthopedic Surgery   Contact information:   [address] [place] [address] [phone]       Follow up  with [facility].   Why:  home health physical therapy   Contact information:   [place] [address] [phone]       Follow up with [facility].   Why:  rolling walker   Contact information:   [address] [place] [address] [phone]       Discharge Instructions    Call MD / Call 911    Complete by:  As directed   If you experience chest pain or shortness of breath, CALL 911 and be transported to the hospital emergency room.  If you develope a fever above 101 F, pus (white drainage) or increased drainage or redness at the wound, or calf pain, call your surgeon's office.     Change dressing    Complete by:  As directed   Maintain surgical dressing until follow up in the clinic. If the edges start to pull up, may reinforce with tape. If the dressing is no longer working, may remove and cover with gauze and tape, but must keep the area dry and clean.  Call with any questions or concerns.     Constipation Prevention    Complete by:  As directed   Drink plenty of fluids.  Prune juice may be helpful.  You may use a stool softener, such as Colace (over the counter) 100 mg twice a day.  Use MiraLax (over the counter) for constipation as needed.     Diet - low sodium heart healthy    Complete by:  As directed      Discharge instructions    Complete by:  As directed   Maintain surgical dressing until follow up in the clinic. If the edges start to pull up, may reinforce with tape. If the dressing is no longer working, may remove and cover with gauze and tape, but must keep the area dry and clean.  Follow up in 2 weeks at [facility]. Call with any questions or concerns.     Increase activity slowly as tolerated    Complete by:  As directed   Weight bearing as tolerated with assist device (walker, cane, etc) as directed, use it as long as suggested by your surgeon or therapist, typically at least 4-6 weeks.      TED hose    Complete by:  As directed   Use stockings (TED hose) for 2 weeks on both leg(s).  You may remove them at night for sleeping.             Medication List    STOP taking these medications        naproxen sodium 220 MG tablet  Commonly known as:  ANAPROX      TAKE these medications        acetaminophen 500 MG tablet  Commonly known as:  TYLENOL  Take 2 tablets (1,000 mg total) by mouth every 8 (eight) hours.     amphetamine-dextroamphetamine 20 MG tablet  Commonly known as:  ADDERALL  Take 20 mg by mouth daily.     aspirin 325 MG EC tablet  Take 1 tablet (325 mg total) by mouth 2 (two) times daily.     baclofen 20 MG tablet  Commonly known as:  LIORESAL  1 1/2 tabs four times a day     calcium-vitamin D 500-200 MG-UNIT tablet  Commonly known as:  OSCAL WITH D  Take 1 tablet by mouth daily with breakfast.     cholecalciferol 1000 units tablet  Commonly known as:  VITAMIN D  Take 1,000 Units by mouth daily.     DEXILANT 60 MG capsule  Generic drug:  dexlansoprazole  TAKE 1 CAPSULE EACH MORNING BEFORE BREAKFAST     diphenhydrAMINE 25 mg in sodium chloride 0.9 % 50 mL  Inject 25 mg into the vein once.     docusate sodium 100 MG capsule  Commonly known as:  COLACE  Take 1 capsule (100 mg total) by mouth 2 (two) times daily.     DULERA 100-5 MCG/ACT Aero  Generic drug:  mometasone-formoterol  INHALE 2 PUFFS IN THE AM AND 2 PUFFS IN THE PM ABOUT 12 HOURS APART.     ferrous sulfate 325 (65 FE) MG tablet  Take 1 tablet (325 mg total) by mouth 3 (three) times daily after meals.     FLUoxetine 40 MG capsule  Commonly known as:  PROZAC  Take 40 mg by mouth daily.     furosemide 20 MG tablet  Commonly known as:  LASIX  TAKE 1 TABLET ONCE DAILY.     gabapentin 600 MG tablet  Commonly known as:  NEURONTIN  Take 600 mg by mouth 2 (two) times daily.     GILENYA 0.5 MG Caps  Generic drug:  Fingolimod HCl  Take 1 capsule by mouth daily.      levothyroxine 100 MCG tablet  Commonly known as:  SYNTHROID, LEVOTHROID  TAKE 1 TABLET ONCE DAILY BEFORE BREAKFAST.     methylPREDNISolone sodium succinate 1000 MG injection  Commonly known as:  SOLU-MEDROL  Inject 100 mg into the vein once.     multivitamin-iron-minerals-folic acid chewable tablet  Chew 1 tablet by mouth daily.     OCREVUS 300 MG/10ML injection  Generic drug:  ocrelizumab  Inject into the vein once.     polyethylene glycol packet  Commonly known as:  MIRALAX / GLYCOLAX  Take 17 g by mouth 2 (two) times daily.     RESTORIL 15 MG capsule  Generic drug:  temazepam  Take 15 mg by mouth daily as needed.     sodium chloride irrigation 0.9 % irrigation  Irrigate with as directed once.     traMADol 50 MG tablet  Commonly known as:  ULTRAM  Take 1-2 tablets (50-100 mg total) by mouth every 6 (six) hours as needed.         Signed: West Pugh. Amil Moseman   PA-C  09/21/2015, 1:37 PM

## 2015-09-21 NOTE — Discharge Summary (Signed)
Physician Discharge Summary  Patient ID: Kayla [name] MRN: [id number] DOB/AGE: Mar 19, 1951 65 y.o.  Admit date: 09/18/2015 Discharge date: 09/20/2015   Procedures:  Procedure(s) (LRB): CONVERSION OF RIGHT HEMI ARTHROPLASTY TO TOTAL HIP ARTHROPLASTY ACETABULAR REVISON  (Right)  Attending Physician:  Dr. Paralee Cancel   Admission Diagnoses:   Right hip pain S/P hemiarthroplasty  Discharge Diagnoses:  Principal Problem:   S/P revision right TH  Past Medical History  Diagnosis Date  . Multiple sclerosis ([place])   . Unspecified hypothyroidism   . Other voice and resonance disorders   . Other diseases of vocal cords     vocal cords are not even  . Cervical cancer ([place]) 2005  . Osteoporosis   . Sleep apnea     no cpap used  . COPD (chronic obstructive pulmonary disease) ([place])   . Arthritis     oa  . Pneumonia 2014, 1992    HPI:    Kayla [name], 65 y.o. female, has a history of pain and functional disability in the right hip due to trauma and patient has failed non-surgical conservative treatments for greater than 12 weeks to include NSAID's and/or analgesics, use of assistive devices and activity modification. The indications for the revision total hip arthroplasty are bearing surface wear leading to symptomatic synovitis. Onset of symptoms was abrupt starting in December 2014 with gradually worsening course since that time. Prior procedures on the right hip include hemi-arthroplasty. Patient currently rates pain in the right hip at 6 out of 10 with activity. There is night pain, worsening of pain with activity and weight bearing, trendelenberg gait, pain that interfers with activities of daily living and pain with passive range of motion. Patient has evidence of previous right hip hemiarthroplasty by imaging studies. This condition presents safety issues increasing the risk of falls.  There is no current active infection. Risks, benefits and expectations were discussed with  the patient. Risks including but not limited to the risk of anesthesia, blood clots, nerve damage, blood vessel damage, failure of the prosthesis, infection and up to and including death. Patient understand the risks, benefits and expectations and wishes to proceed with surgery.   PCP: Christinia Gully, MD   Discharged Condition: good  Hospital Course:  Patient underwent the above stated procedure on 09/18/2015. Patient tolerated the procedure well and brought to the recovery room in good condition and subsequently to the floor.  POD #1 BP: 100/60 ; Pulse: 68 ; Temp: 97.8 F (36.6 C) ; Resp: 16 Patient reports pain as mild, pain controlled. No events throughout the night. Dorsiflexion/plantar flexion intact, incision: dressing C/D/I, no cellulitis present and compartment soft.   LABS  Basename    HGB     10.7  HCT     31.9   POD #2  BP: 102/53 ; Pulse: 64 ; Temp: 98.7 F (37.1 C) ; Resp: 16 Patient reports pain as mild, pain well controlled. States that the tramadol was causing issues and doesn't want it. APAP doing well at controlling pain. Some nausea last night, pt feel medication induced, otherwise no events throughout the night. Worked well with PT today. Ready to be discharged home. Dorsiflexion/plantar flexion intact, incision: dressing C/D/I, no cellulitis present and compartment soft.   LABS  Basename    HGB     10.0  HCT     29.1    Discharge Exam: General appearance: alert, cooperative and no distress Extremities: Homans sign is negative, no sign of DVT, no edema,  redness or tenderness in the calves or thighs and no ulcers, gangrene or trophic changes  Disposition: Home with follow up in 2 weeks   Follow-up Information    Follow up with Kayla Pole, MD. Schedule an appointment as soon as possible for a visit in 2 weeks.   Specialty:  Orthopedic Surgery   Contact information:   [address] [place] [address] [phone]       Follow up  with [facility].   Why:  home health physical therapy   Contact information:   [place] [address] [phone]       Follow up with [facility].   Why:  rolling walker   Contact information:   [address] [place]  [address] [phone]       Discharge Instructions    Call MD / Call 911    Complete by:  As directed   If you experience chest pain or shortness of breath, CALL 911 and be transported to the hospital emergency room.  If you develope a fever above 101 F, pus (white drainage) or increased drainage or redness at the wound, or calf pain, call your surgeon's office.     Change dressing    Complete by:  As directed   Maintain surgical dressing until follow up in the clinic. If the edges start to pull up, may reinforce with tape. If the dressing is no longer working, may remove and cover with gauze and tape, but must keep the area dry and clean.  Call with any questions or concerns.     Constipation Prevention    Complete by:  As directed   Drink plenty of fluids.  Prune juice may be helpful.  You may use a stool softener, such as Colace (over the counter) 100 mg twice a day.  Use MiraLax (over the counter) for constipation as needed.     Diet - low sodium heart healthy    Complete by:  As directed      Discharge instructions    Complete by:  As directed   Maintain surgical dressing until follow up in the clinic. If the edges start to pull up, may reinforce with tape. If the dressing is no longer working, may remove and cover with gauze and tape, but must keep the area dry and clean.  Follow up in 2 weeks at [facility]. Call with any questions or concerns.     Increase activity slowly as tolerated    Complete by:  As directed   Weight bearing as tolerated with assist device (walker, cane, etc) as directed, use it as long as suggested by your surgeon or therapist, typically at least 4-6 weeks.      TED hose    Complete by:  As directed   Use stockings (TED hose) for 2 weeks on both leg(s).  You may remove them at night for sleeping.             Medication List    STOP taking these medications        naproxen sodium 220 MG tablet  Commonly known as:  ANAPROX      TAKE these medications        acetaminophen 500 MG tablet  Commonly known as:  TYLENOL  Take 2 tablets (1,000 mg total) by mouth every 8 (eight) hours.     amphetamine-dextroamphetamine 20 MG tablet  Commonly known as:  ADDERALL  Take 20 mg by mouth daily.     aspirin 325 MG EC tablet  Take 1 tablet (325 mg total) by mouth 2 (two) times daily.     baclofen 20 MG tablet  Commonly known as:  LIORESAL  1 1/2 tabs four times a day     calcium-vitamin D 500-200 MG-UNIT tablet  Commonly known as:  OSCAL WITH D  Take 1 tablet by mouth daily with breakfast.     cholecalciferol 1000 units tablet  Commonly known as:  VITAMIN D  Take 1,000 Units by mouth daily.     DEXILANT 60 MG capsule  Generic drug:  dexlansoprazole  TAKE 1 CAPSULE EACH MORNING BEFORE BREAKFAST     diphenhydrAMINE 25 mg in sodium chloride 0.9 % 50 mL  Inject 25 mg into the vein once.     docusate sodium 100 MG capsule  Commonly known as:  COLACE  Take 1 capsule (100 mg total) by mouth 2 (two) times daily.     DULERA 100-5 MCG/ACT Aero  Generic drug:  mometasone-formoterol  INHALE 2 PUFFS IN THE AM AND 2 PUFFS IN THE PM ABOUT 12 HOURS APART.     ferrous sulfate 325 (65 FE) MG tablet  Take 1 tablet (325 mg total) by mouth 3 (three) times daily after meals.     FLUoxetine 40 MG capsule  Commonly known as:  PROZAC  Take 40 mg by mouth daily.     furosemide 20 MG tablet  Commonly known as:  LASIX  TAKE 1 TABLET ONCE DAILY.     gabapentin 600 MG tablet  Commonly known as:  NEURONTIN  Take 600 mg by mouth 2 (two) times daily.     GILENYA 0.5 MG Caps  Generic drug:  Fingolimod HCl  Take 1 capsule by mouth daily.      levothyroxine 100 MCG tablet  Commonly known as:  SYNTHROID, LEVOTHROID  TAKE 1 TABLET ONCE DAILY BEFORE BREAKFAST.     methylPREDNISolone sodium succinate 1000 MG injection  Commonly known as:  SOLU-MEDROL  Inject 100 mg into the vein once.     multivitamin-iron-minerals-folic acid chewable tablet  Chew 1 tablet by mouth daily.     OCREVUS 300 MG/10ML injection  Generic drug:  ocrelizumab  Inject into the vein once.     polyethylene glycol packet  Commonly known as:  MIRALAX / GLYCOLAX  Take 17 g by mouth 2 (two) times daily.     RESTORIL 15 MG capsule  Generic drug:  temazepam  Take 15 mg by mouth daily as needed.     sodium chloride irrigation 0.9 % irrigation  Irrigate with as directed once.     traMADol 50 MG tablet  Commonly known as:  ULTRAM  Take 1-2 tablets (50-100 mg total) by mouth every 6 (six) hours as needed.         Signed: West Pugh. Dryden Tapley   PA-C  09/21/2015, 1:40 PM

## 2015-09-24 ENCOUNTER — Emergency Department (HOSPITAL_COMMUNITY)
Admission: EM | Admit: 2015-09-24 | Discharge: 2015-09-24 | Disposition: A | Discharge status: Home / Self Care | Payer: BLUE CROSS/BLUE SHIELD | Attending: Emergency Medicine | Admitting: Emergency Medicine

## 2015-09-24 ENCOUNTER — Emergency Department (HOSPITAL_COMMUNITY): Payer: BLUE CROSS/BLUE SHIELD

## 2015-09-24 ENCOUNTER — Telehealth: Payer: Self-pay | Admitting: Internal Medicine

## 2015-09-24 ENCOUNTER — Encounter (HOSPITAL_COMMUNITY): Payer: Self-pay | Admitting: Emergency Medicine

## 2015-09-24 DIAGNOSIS — R0602 Shortness of breath: Secondary | ICD-10-CM | POA: Diagnosis not present

## 2015-09-24 DIAGNOSIS — Z791 Long term (current) use of non-steroidal anti-inflammatories (NSAID): Secondary | ICD-10-CM | POA: Insufficient documentation

## 2015-09-24 DIAGNOSIS — Z7982 Long term (current) use of aspirin: Secondary | ICD-10-CM | POA: Diagnosis not present

## 2015-09-24 DIAGNOSIS — Z79899 Other long term (current) drug therapy: Secondary | ICD-10-CM | POA: Diagnosis not present

## 2015-09-24 DIAGNOSIS — M1990 Unspecified osteoarthritis, unspecified site: Secondary | ICD-10-CM | POA: Insufficient documentation

## 2015-09-24 DIAGNOSIS — J449 Chronic obstructive pulmonary disease, unspecified: Secondary | ICD-10-CM | POA: Insufficient documentation

## 2015-09-24 DIAGNOSIS — Z8541 Personal history of malignant neoplasm of cervix uteri: Secondary | ICD-10-CM | POA: Diagnosis not present

## 2015-09-24 DIAGNOSIS — E039 Hypothyroidism, unspecified: Secondary | ICD-10-CM | POA: Diagnosis not present

## 2015-09-24 LAB — BASIC METABOLIC PANEL
Anion gap: 7 (ref 5–15)
BUN: 14 mg/dL (ref 6–20)
CO2: 29 mmol/L (ref 22–32)
Calcium: 9.3 mg/dL (ref 8.9–10.3)
Chloride: 98 mmol/L — ABNORMAL LOW (ref 101–111)
Creatinine, Ser: 0.67 mg/dL (ref 0.44–1.00)
GFR calc Af Amer: >60 mL/min (ref >60)
GFR calc non Af Amer: >60 mL/min (ref >60)
Glucose, Bld: 91 mg/dL (ref 65–99)
Potassium: 4.3 mmol/L (ref 3.5–5.1)
Sodium: 134 mmol/L — ABNORMAL LOW (ref 135–145)

## 2015-09-24 LAB — CBC WITH DIFFERENTIAL/PLATELET
Basophils Absolute: 0 10*3/uL (ref 0.0–0.1)
Basophils Relative: 1 %
Eosinophils Absolute: 0.2 10*3/uL (ref 0.0–0.7)
Eosinophils Relative: 3 %
HCT: 29.9 % — ABNORMAL LOW (ref 36.0–46.0)
Hemoglobin: 10 g/dL — ABNORMAL LOW (ref 12.0–15.0)
Lymphocytes Relative: 14 %
Lymphs Abs: 0.8 10*3/uL (ref 0.7–4.0)
MCH: 30.4 pg (ref 26.0–34.0)
MCHC: 33.4 g/dL (ref 30.0–36.0)
MCV: 90.9 fL (ref 78.0–100.0)
Monocytes Absolute: 0.6 10*3/uL (ref 0.1–1.0)
Monocytes Relative: 11 %
Neutro Abs: 3.7 10*3/uL (ref 1.7–7.7)
Neutrophils Relative %: 71 %
Platelets: 387 10*3/uL (ref 150–400)
RBC: 3.29 MIL/uL — ABNORMAL LOW (ref 3.87–5.11)
RDW: 13.4 % (ref 11.5–15.5)
WBC: 5.3 10*3/uL (ref 4.0–10.5)

## 2015-09-24 LAB — I-STAT TROPONIN, ED: Troponin i, poc: 0 ng/mL (ref 0.00–0.08)

## 2015-09-24 LAB — D-DIMER, QUANTITATIVE: D-Dimer, Quant: 1.91 ug/mL-FEU — ABNORMAL HIGH (ref 0.00–0.50)

## 2015-09-24 MED ORDER — ALBUTEROL SULFATE (2.5 MG/3ML) 0.083% IN NEBU
5.0000 mg | INHALATION_SOLUTION | Freq: Once | RESPIRATORY_TRACT | Status: AC
  Administered 2015-09-24: 5 mg via RESPIRATORY_TRACT
  Filled 2015-09-24: qty 6

## 2015-09-24 MED ORDER — SODIUM CHLORIDE 0.9 % IV BOLUS (SEPSIS)
500.0000 mL | Freq: Once | INTRAVENOUS | Status: AC
  Administered 2015-09-24: 500 mL via INTRAVENOUS

## 2015-09-24 MED ORDER — IOPAMIDOL (ISOVUE-370) INJECTION 76%
100.0000 mL | Freq: Once | INTRAVENOUS | Status: AC | PRN
  Administered 2015-09-24: 100 mL via INTRAVENOUS

## 2015-09-24 NOTE — Telephone Encounter (Signed)
Spoke with pt, states she had hip replacement surgery 6 days ago, since then has had a rattling in chest, prod cough with yellow mucus.  Denies fever, chest pain, sinus congestion.  Pt has been using incentive spirometer since surgery.  Requesting further recs.    Pt uses [facility].    MW please advise on recs.  Thanks!    Patient Instructions       Please see patient coordinator before you leave today  to schedule venous and arterial dopplers both legs   Please remember to go to the lab  department downstairs for your tests - we will call you with the results when they are available.

## 2015-09-24 NOTE — ED Provider Notes (Signed)
CSN: [id number]     Arrival date & time 09/24/15  1147 History   First MD Initiated Contact with Patient 09/24/15 1236     Chief Complaint  Patient presents with  . Shortness of Breath     (Consider location/radiation/quality/duration/timing/severity/associated sxs/prior Treatment) HPI   Patient is a 65 year old female with past medical history of MS, hypothyroidism who presents the ED with complaint of shortness of breath. Patient reports having a right total hip replacement performed on 6/27 and notes she was discharged home on 6/29. Patient reports over the weekend she began having shortness of breath which she states typically occurs after talking for long periods of time. Patient reports she has intermittent shortness of breath at baseline which she has been seen by her PCP about and notes she has an inhaler at home. She states she has been using her inhaler without relief. Patient has been reports her shortness of breath over the weekend appeared slightly worse than her typical SOB. Endorses associated intermittent wheezing. Denies fever, chills, headache, lightheadedness, dizziness, sore throat, cough, nasal congestion, rhinorrhea, chest pain, palpitations, abdominal pain, vomiting, diarrhea, numbness, tingling, weakness. Pt reports her swelling in her right leg is unchanged since her surgery.   Past Medical History  Diagnosis Date  . Multiple sclerosis ([place])   . Unspecified hypothyroidism   . Other voice and resonance disorders   . Other diseases of vocal cords     vocal cords are not even  . Cervical cancer ([place]) 2005  . Osteoporosis   . Sleep apnea     no cpap used  . COPD (chronic obstructive pulmonary disease) ([place])   . Arthritis     oa  . Pneumonia 2014, 1992   Past Surgical History  Procedure Laterality Date  . Vesicovaginal fistula closure w/ tah  2005  . Hip arthroplasty Right 03/01/2013    Procedure: ARTHROPLASTY BIPOLAR HIP;  Surgeon: Mauri Pole, MD;  Location:  [facility];  Service: Orthopedics;  Laterality: Right;  . Hip closed reduction Right 03/12/2013    Procedure: CLOSED REDUCTION HIP;  Surgeon: Gearlean Alf, MD;  Location: [place] [facility];  Service: Orthopedics;  Laterality: Right;  . Hip closed reduction Right 03/19/2013    Procedure: CLOSED REDUCTION HIP;  Surgeon: Johnn Hai, MD;  Location: [facility];  Service: Orthopedics;  Laterality: Right;  . Abdominal hysterectomy  2005    for cervical cancer, complete  . Eye surgery Bilateral     lens for cataracts  . Conversion to total hip Right 09/18/2015    Procedure: CONVERSION OF RIGHT HEMI ARTHROPLASTY TO TOTAL HIP ARTHROPLASTY ACETABULAR REVISON ;  Surgeon: Paralee Cancel, MD;  Location: [facility];  Service: Orthopedics;  Laterality: Right;   Family History  Problem Relation Age of Onset  . Colon cancer Father    Social History  Substance Use Topics  . Smoking status: Never Smoker   . Smokeless tobacco: Never Used  . Alcohol Use: Yes     Comment: rare   OB History    No data available     Review of Systems  Respiratory: Positive for shortness of breath and wheezing.   All other systems reviewed and are negative.     Allergies  Methylprednisolone; Morphine and related; Oxycodone-acetaminophen; Phenytoin sodium extended; and Zanaflex  Home Medications   Prior to Admission medications   Medication Sig Start Date End Date Taking? Authorizing Provider  acetaminophen (TYLENOL) 500 MG tablet Take 2 tablets (1,000 mg total) by mouth every  8 (eight) hours. Patient taking differently: Take 1,000 mg by mouth every 8 (eight) hours as needed for mild pain or moderate pain.  09/19/15  Yes Matthew Babish, PA-C  amphetamine-dextroamphetamine (ADDERALL) 20 MG tablet Take 20 mg by mouth daily.   Yes Historical Provider, MD  aspirin EC 325 MG EC tablet Take 1 tablet (325 mg total) by mouth 2 (two) times daily. 09/19/15 10/17/15 Yes Matthew Babish, PA-C  baclofen (LIORESAL) 20 MG tablet Take 20 mg by mouth 3  (three) times daily.  03/30/13  Yes Ivan Anchors Love, PA-C  calcium-vitamin D (OSCAL WITH D) 500-200 MG-UNIT per tablet Take 1 tablet by mouth daily with breakfast.   Yes Historical Provider, MD  cholecalciferol (VITAMIN D) 1000 UNITS tablet Take 2,000 Units by mouth daily.    Yes Historical Provider, MD  DEXILANT 60 MG capsule TAKE 1 CAPSULE EACH MORNING BEFORE BREAKFAST 01/22/15  Yes Tanda Rockers, MD  diphenhydrAMINE 25 mg in sodium chloride 0.9 % 50 mL Inject 25 mg into the vein once. Reported on 09/24/2015   Yes Historical Provider, MD  DULERA 100-5 MCG/ACT AERO INHALE 2 PUFFS IN THE AM AND 2 PUFFS IN THE PM ABOUT 12 HOURS APART. Patient taking differently: INHALE 2 PUFFS IN THE AM AND 2 PUFFS IN THE PM ABOUT 12 HOURS APART as needed for shortness of breath   Yes Tanda Rockers, MD  ferrous sulfate 325 (65 FE) MG tablet Take 1 tablet (325 mg total) by mouth 3 (three) times daily after meals. 09/19/15  Yes Matthew Babish, PA-C  FLUoxetine (PROZAC) 40 MG capsule Take 40 mg by mouth daily.     Yes Historical Provider, MD  furosemide (LASIX) 20 MG tablet TAKE 1 TABLET ONCE DAILY. 09/03/15  Yes Tanda Rockers, MD  gabapentin (NEURONTIN) 600 MG tablet Take 600 mg by mouth 2 (two) times daily.   Yes Historical Provider, MD  Lactobacillus (ACIDOPHILUS) CAPS capsule Take 1 capsule by mouth daily.   Yes Historical Provider, MD  levothyroxine (SYNTHROID, LEVOTHROID) 100 MCG tablet TAKE 1 TABLET ONCE DAILY BEFORE BREAKFAST. 09/03/15  Yes Tanda Rockers, MD  multivitamin-iron-minerals-folic acid (CENTRUM) chewable tablet Chew 1 tablet by mouth daily.   Yes Historical Provider, MD  ocrelizumab (OCREVUS) 300 MG/10ML injection Inject 300 mg into the vein once. Every 6 months   Yes Historical Provider, MD  promethazine (PHENERGAN) 12.5 MG tablet Take 12.5 mg by mouth every 6 (six) hours as needed for nausea or vomiting.   Yes Historical Provider, MD  temazepam (RESTORIL) 15 MG capsule Take 15 mg by mouth daily as needed  for sleep.    Yes Historical Provider, MD  traMADol (ULTRAM) 50 MG tablet Take 1-2 tablets (50-100 mg total) by mouth every 6 (six) hours as needed. Patient taking differently: Take 50-100 mg by mouth every 6 (six) hours as needed for moderate pain.  09/19/15  Yes Danae Orleans, PA-C  docusate sodium (COLACE) 100 MG capsule Take 1 capsule (100 mg total) by mouth 2 (two) times daily. Patient not taking: Reported on 09/24/2015 09/19/15   Danae Orleans, PA-C  polyethylene glycol (MIRALAX / Floria Raveling) packet Take 17 g by mouth 2 (two) times daily. Patient not taking: Reported on 09/24/2015 09/19/15   Danae Orleans, PA-C   BP 116/63 mmHg  Pulse 77  Temp(Src) 97.3 F (36.3 C) (Oral)  Resp 15  SpO2 95% Physical Exam  Constitutional: She is oriented to person, place, and time. She appears well-developed and well-nourished. No distress.  HENT:  Head: Normocephalic and atraumatic.  Mouth/Throat: Oropharynx is clear and moist. No oropharyngeal exudate.  Eyes: Conjunctivae and EOM are normal. Right eye exhibits no discharge. Left eye exhibits no discharge. No scleral icterus.  Neck: Normal range of motion. Neck supple.  Cardiovascular: Normal rate, regular rhythm, normal heart sounds and intact distal pulses.   HR 72  Pulmonary/Chest: Breath sounds normal. No respiratory distress. She has no wheezes. She has no rales. She exhibits no tenderness.  Mild increased work of breathing noted  Abdominal: Soft. Bowel sounds are normal. She exhibits no distension. There is no tenderness.  Musculoskeletal: She exhibits no edema.  Neurological: She is alert and oriented to person, place, and time.  Skin: Skin is warm and dry. She is not diaphoretic.  Nursing note and vitals reviewed.   ED Course  Procedures (including critical care time) Labs Review Labs Reviewed  CBC WITH DIFFERENTIAL/PLATELET - Abnormal; Notable for the following:    RBC 3.29 (*)    Hemoglobin 10.0 (*)    HCT 29.9 (*)    All other  components within normal limits  BASIC METABOLIC PANEL - Abnormal; Notable for the following:    Sodium 134 (*)    Chloride 98 (*)    All other components within normal limits  D-DIMER, QUANTITATIVE (NOT AT [facility]) - Abnormal; Notable for the following:    D-Dimer, Quant 1.91 (*)    All other components within normal limits  I-STAT TROPOININ, ED    Imaging Review Dg Chest 2 View  09/24/2015  CLINICAL DATA:  Shortness of breath and wheezing for 3 days. EXAM: CHEST  2 VIEW COMPARISON:  PA and lateral chest 11/04/2013. Single view of the chest 02/27/2013. FINDINGS: Asymmetric elevation of the right hemidiaphragm relative to the left is identified. The lungs are clear. Heart size is normal. No pneumothorax or pleural effusion. IMPRESSION: No acute disease. Electronically Signed   By: Inge Rise M.D.   On: 09/24/2015 13:50   Ct Angio Chest Pe W/cm &/or Wo Cm  09/24/2015  CLINICAL DATA:  Shortness of breath since Saturday, RIGHT hip replacement last week EXAM: CT ANGIOGRAPHY CHEST WITH CONTRAST TECHNIQUE: Multidetector CT imaging of the chest was performed using the standard protocol during bolus administration of intravenous contrast. Multiplanar CT image reconstructions and MIPs were obtained to evaluate the vascular anatomy. CONTRAST:  100 cc Isovue 370 IV COMPARISON:  None ; correlation chest radiograph 09/24/2015 FINDINGS: Cardiovascular: Aorta normal caliber without aneurysm or dissection. Pulmonary arteries well opacified and patent; no evidence of pulmonary embolism. Mediastinum/Nodes: No definite esophageal abnormalities. No thoracic adenopathy. Visualized base of cervical region normal appearance. Lungs/Pleura: Patent atelectasis posterior sulcus RIGHT lower lobe and minimally posterior LEFT lower lobe. Lungs otherwise clear. No infiltrate, pleural effusion, or pneumothorax. Upper Abdomen: Visualized upper abdomen unremarkable Musculoskeletal: Unremarkable Review of the MIP images confirms the  above findings. IMPRESSION: No evidence of pulmonary embolism. RIGHT basilar atelectasis. Electronically Signed   By: Lavonia Dana M.D.   On: 09/24/2015 15:52   I have personally reviewed and evaluated these images and lab results as part of my medical decision-making.   EKG Interpretation   Date/Time:  Monday September 24 2015 11:56:54 EDT Ventricular Rate:  80 PR Interval:    QRS Duration: 92 QT Interval:  380 QTC Calculation: 439 R Axis:   93 Text Interpretation:  Sinus rhythm Probable left atrial enlargement Right  axis deviation No significant change since last tracing Confirmed by ALLEN   MD, ANTHONY ([id number])  on 09/24/2015 12:45:39 PM      MDM   Final diagnoses:  SOB (shortness of breath)    Pt presents with intermittent SOB and wheezing. Pt reports having total right hip replacement performed last week. She reports having similar episodes of SOB in the past which she relates to her MS and reports having a dulera inhaler at home. Pt reports her physical therapist at home today reported her vitals as HR 110 and oxygen in the 80s on RA. VSS, HR 72, O2 100% on RA. Exam revealed mild increased work of breathing, lungs clear to auscultation bilaterally. Pt given albuterol neb in the ED. EKG showed sinus rhythm, no significant change from prior. Chest x-ray negative. Troponin negative. D-dimer 1.91. Will order CT angio chest for further evaluation, including PE. CT angio chest negative for PE. On reevaluation patient with improved work of breathing, she reports her shortness of breath has mildly improved. Discussed results and plan for discharge with patient. Advised patient to follow up with her PCP. Discussed return precautions with patient.    Chesley Noon Hana, [place] 09/24/15 1628  Lacretia Leigh, MD 09/24/15 1745

## 2015-09-24 NOTE — Telephone Encounter (Signed)
Pt called back and states that her home health nurse is present in home doing check up and her O2 is 80% and HR 111 Pt does not have home O2. Pt is wheezing.   Per Dr Melvyn Novas, pt needs to be evaluated at the ED.  Pt aware of rec's per Dr Melvyn Novas and will go to ED now. Pt states that she is going to [facility].  Nothing further needed.

## 2015-09-24 NOTE — ED Notes (Signed)
RN starting IV and drawing labs 

## 2015-09-24 NOTE — Discharge Instructions (Signed)
Continue taking your home medications as prescribed. I recommend continuing to use your inhaler as prescribed as needed for shortness of breath. Follow-up with your primary care provider within the next week. Please return to the Emergency Department if symptoms worsen or new onset of fever, worsening shortness of breath, chest pain, cough, lightheadedness, dizziness, syncope, leg pain, worsening leg swelling.

## 2015-09-24 NOTE — ED Notes (Signed)
Pt reports SOB since Saturday. Had a hip replacement last week on the R side

## 2015-09-27 ENCOUNTER — Telehealth: Payer: Self-pay | Admitting: Internal Medicine

## 2015-09-27 NOTE — Telephone Encounter (Signed)
Spoke with pt. States that she has developed a rash on her stomach. This started this AM. Rash is red, warm to the touch and itching. Denies fever, chills, sweats or that the rash is raised or painful. She has not changed laundry detergents, body lotion or body soap. Would like MW's recommendations.  MW - please advise. Thanks.

## 2015-09-27 NOTE — Telephone Encounter (Signed)
Pt aware of rec's per MW.  Nothing further needed.  

## 2015-09-27 NOTE — Telephone Encounter (Signed)
Try cortisone otc and see tomorrow if not better  Benadryl for itching

## 2015-10-01 ENCOUNTER — Ambulatory Visit (INDEPENDENT_AMBULATORY_CARE_PROVIDER_SITE_OTHER): Payer: BLUE CROSS/BLUE SHIELD | Admitting: Internal Medicine

## 2015-10-01 ENCOUNTER — Encounter: Payer: Self-pay | Admitting: Internal Medicine

## 2015-10-01 VITALS — BP 112/60 | HR 88 | Ht 63.0 in | Wt 130.0 lb

## 2015-10-01 DIAGNOSIS — R0600 Dyspnea, unspecified: Secondary | ICD-10-CM | POA: Diagnosis not present

## 2015-10-01 DIAGNOSIS — J45909 Unspecified asthma, uncomplicated: Secondary | ICD-10-CM

## 2015-10-01 MED ORDER — PREDNISONE 10 MG PO TABS: ORAL_TABLET | ORAL | Status: DC

## 2015-10-01 NOTE — Patient Instructions (Addendum)
Add pepcid 20 mg x one at bedtime  Prednisone 10 mg take  4 each am x 2 days,   2 each am x 2 days,  1 each am x 2 days and stop   Start back on cpap  See Dr [name] asap  Keep previous appt > call sooner if needed  Note needs prevnar next ov

## 2015-10-01 NOTE — Progress Notes (Signed)
Subjective:    Patient ID: Kayla [name], female    DOB: 09/05/1950   MRN: [id number]   Brief patient profile:  80 yowf never smoker with MS since Oct 96 all neuro care through Dr [name] in [facility]),   [name] at [facility] urology,  [facility]  Followed here for Primary care/hypothyroid     History of Present Illness  October 04, 2009 Followup with PFT's. Pt states that her breathing is no better since last seen June 2011 for acute visit. She states she is still wheezing- ventolin helps and is using this up to tid. w/u c/w anemia  Mild dysphagia no more than usual. occ waking up wheezing at night despite zegerid at hs.   rec  Try  use dulera 100 2 bid prn    10/01/2015  f/u ov/Khylon Davies re: sob s/p et on dulera 100 2bid and dexilant q am  Chief Complaint  Patient presents with  . Acute Visit    Pt c/o increased DOE since had hip replacement 09/18/15. She states she gets SOB with just talking, showering or walking short distances.   has cpap machine but not using at all  - prev eval and rx by Dr [name]   No obvious day to day or daytime variabilty or assoc excess/ purulent sputum or mucus plugs   or   cp or chest tightness, subjective wheeze or overt  hb symptoms. No unusual exp hx or h/o childhood pna/ asthma or knowledge of premature birth.  Sleeping ok without nocturnal  or early am exacerbation  of respiratory  c/o's or need for noct saba. Also denies any obvious fluctuation of symptoms with weather or environmental changes or other aggravating or alleviating factors except as outlined above   Current Medications, Allergies, Complete Past Medical History, Past Surgical History, Family History, and Social History were reviewed in [employer] record.    ROS  The following are not active complaints unless bolded sore throat, dysphagia, dental problems, itching, sneezing,  nasal congestion or excess/ purulent secretions, ear ache,   fever, chills,  sweats, unintended wt loss, pleuritic or exertional cp, hemoptysis,  orthopnea pnd or leg swelling, presyncope, palpitations, heartburn, abdominal pain, anorexia, nausea, vomiting, diarrhea  or change in bowel or urinary habits, change in stools or urine, dysuria,hematuria,  rash, arthralgias, visual complaints, headache, numbness weakness or ataxia or problems with walking uses cane  or coordination,  change in mood/affect or memory.       Past Medical History:  MULTIPLE SCLEROSIS (ICD-340)........................Marland Kitchen Dr Effie Shy [facility], was at [facility])  - dx 12/1994  Sleep Apnea.............................................................[name]      - CPAP rx 04/2010 HYPOTHYROIDISM (ICD-244.9)  OTHER DISEASES OF VOCAL CORDS (ICD-478.5)  Cervical Cancer > complete hysterectomy...........Marland Kitchen [name] HOARSENESS (ICD-784.49).................................  [name]  Dyspnea/wheeze ? asthma  - HFA 50 % after coaching October 04, 2009 >  50% 06/25/2010  - PFT's October 04, 2009 FEV1 2.15 (95%) with ratio 69 and DLCO 58> corrects to 98, mild insp truncation  Health Maintenance.............................................Marland KitchenWert  - Pneumovax March 28, 2009  - Td  April 10, 2009        Objective:   Physical Exam  amb  white female nad / walks with cane / audible prominent pseudowheeze   Wt  118 > 127 March 28, 2009 >129 June 17 > 128 October 04, 2009 > 129 06/25/2010 >125 10/29/2010 > 128 11/26/2010 >>127 12/10/2010 > 11/17/2011  129> 126 05/20/2012 >  07/29/2012  129 > 10/20/2012  129 >  135 06/21/2013 >  11/04/2013 135 >134 04/04/2014 > 04/18/2014 135 >  07/17/2014 132> 08/17/2014  135 > 12/20/2014 > 01/17/2015 134   > 08/08/2015   126 >  10/01/2015   130    HEENT: nl dentition, turbinates, and orophanx. Nl external ear canals without cough reflex  Neck without JVD/Nodes/TM  Lungs clear with only transmitted noises from upper airway  RRR no s3 or murmur or increase in P2  Abd soft and  benign with nl excursion in the supine position. No bruits or organomegaly / no cvat Ext warm hands, sym cool feet with nl pulses and  without calf tenderness, cyanosis clubbing - both feet with trace Pitting/ sym edema purplish discoloration esp bottoms of feet and toes  Skin warm and dry without lesions Neuro:  nl sensorium, walks with cane, no obvious motor deficits / nl dtrs                     Assessment & Plan:

## 2015-10-03 DIAGNOSIS — R061 Stridor: Secondary | ICD-10-CM | POA: Insufficient documentation

## 2015-10-06 ENCOUNTER — Encounter: Payer: Self-pay | Admitting: Internal Medicine

## 2015-10-06 NOTE — Assessment & Plan Note (Signed)
This component of the problem appears well controlled on dulera 100 2bid

## 2015-10-06 NOTE — Assessment & Plan Note (Addendum)
Dyspnea/wheeze ? asthma  - HFA 50 % after coaching October 04, 2009 >  Better on dulera 100 > hfa  50% 06/25/2010  - PFT's October 04, 2009 FEV1 2.15 (95%) with ratio 69 and DLCO 58> corrects to 98, mild insp truncation  - 07/17/2014 p extensive coaching HFA effectiveness =    75%  - flared p ET 09/18/15   Of the three most common causes of chronic cough, only one (GERD)  can actually cause the other two (asthma and post nasal drip syndrome)  and perpetuate the cylce of cough inducing airway trauma, inflammation, heightened sensitivity to reflux which is prompted by the cough itself via a cyclical mechanism.    This may partially respond to steroids and look like asthma and post nasal drainage but never erradicated completely unless the cough and the secondary reflux are eliminated, preferably both at the same time.  While not intuitively obvious, many patients with chronic low grade reflux(which she has)  do not cough(or have upper airway wheezing which is the case here) until there is a secondary insult that disturbs the protective epithelial barrier and exposes sensitive nerve endings.  This can be viral or direct physical injury such as with an endotracheal tube.   The point is that once this occurs, it is difficult to eliminate using anything but a maximally effective acid suppression regimen at least in the short run, accompanied by an appropriate diet to address non acid GERD.   Will add pepcid at bedtime and short course of prednisone for whatever inflammatory/asthmatic component may be present and  encouraged use of cpap as much as possible esp at hs but also prn daytime  I had an extended discussion with the patient reviewing all relevant studies completed to date and  lasting 15 to 20 minutes of a 25 minute visit    Each maintenance medication was reviewed in detail including most importantly the difference between maintenance and prns and under what circumstances the prns are to be triggered using  an action plan format that is not reflected in the computer generated alphabetically organized AVS.    Please see instructions for details which were reviewed in writing and the patient given a copy highlighting the part that I personally wrote and discussed at today's ov.

## 2015-10-09 ENCOUNTER — Telehealth: Payer: Self-pay | Admitting: Internal Medicine

## 2015-10-09 NOTE — Telephone Encounter (Signed)
Form received and filled out - given to MW to sign.  This needs to be faxed back to [place] [employer] [place] Will send to [facility] for follow up

## 2015-10-09 NOTE — Telephone Encounter (Signed)
Called [place] for PA on [place] 60mg , [phone] Member ID# [id number] Spoke with Bea Graff.- PA form is being faxed to our office - cannot be initiated over the phone.  Will hold in triage until Fax is received

## 2015-10-10 ENCOUNTER — Other Ambulatory Visit: Payer: Self-pay | Admitting: Otolaryngology

## 2015-10-10 DIAGNOSIS — J3802 Paralysis of vocal cords and larynx, bilateral: Secondary | ICD-10-CM

## 2015-10-10 DIAGNOSIS — R061 Stridor: Secondary | ICD-10-CM

## 2015-10-10 NOTE — Telephone Encounter (Signed)
I have faxed the forms  Awaiting decision now

## 2015-10-18 ENCOUNTER — Ambulatory Visit
Admission: RE | Admit: 2015-10-18 | Discharge: 2015-10-18 | Disposition: A | Discharge status: Home / Self Care | Payer: BLUE CROSS/BLUE SHIELD | Source: Ambulatory Visit | Attending: Otolaryngology | Admitting: Otolaryngology

## 2015-10-18 ENCOUNTER — Other Ambulatory Visit: Payer: Self-pay | Admitting: Otolaryngology

## 2015-10-18 DIAGNOSIS — J3802 Paralysis of vocal cords and larynx, bilateral: Secondary | ICD-10-CM

## 2015-10-18 DIAGNOSIS — R061 Stridor: Secondary | ICD-10-CM

## 2015-10-18 NOTE — Telephone Encounter (Signed)
Called [employer]. PA has been approved through 03/13/2038.  [place] and spoke to [place]. They were already aware that this had been approved.

## 2015-10-19 ENCOUNTER — Telehealth: Payer: Self-pay | Admitting: Internal Medicine

## 2015-10-19 DIAGNOSIS — G4733 Obstructive sleep apnea (adult) (pediatric): Secondary | ICD-10-CM

## 2015-10-19 NOTE — Telephone Encounter (Signed)
Spoke with [name] at [facility], states that pt does not have her cpap through [facility].   Called pt, states she uses [facility] and not [facility].  New order placed to correct dme company.  Nothing further needed.

## 2015-10-19 NOTE — Telephone Encounter (Signed)
Spoke with pt. Her CPAP machine is not working. An order has been placed for [facility] to service machine. Nothing further was needed.

## 2015-11-06 ENCOUNTER — Other Ambulatory Visit (INDEPENDENT_AMBULATORY_CARE_PROVIDER_SITE_OTHER): Payer: BLUE CROSS/BLUE SHIELD

## 2015-11-06 ENCOUNTER — Ambulatory Visit (INDEPENDENT_AMBULATORY_CARE_PROVIDER_SITE_OTHER): Payer: BLUE CROSS/BLUE SHIELD | Admitting: Internal Medicine

## 2015-11-06 ENCOUNTER — Encounter: Payer: Self-pay | Admitting: Internal Medicine

## 2015-11-06 VITALS — BP 136/74 | HR 80 | Ht 63.0 in | Wt 130.4 lb

## 2015-11-06 DIAGNOSIS — E038 Other specified hypothyroidism: Secondary | ICD-10-CM

## 2015-11-06 DIAGNOSIS — Z23 Encounter for immunization: Secondary | ICD-10-CM

## 2015-11-06 DIAGNOSIS — Z0000 Encounter for general adult medical examination without abnormal findings: Secondary | ICD-10-CM

## 2015-11-06 DIAGNOSIS — J383 Other diseases of vocal cords: Secondary | ICD-10-CM

## 2015-11-06 DIAGNOSIS — J4520 Mild intermittent asthma, uncomplicated: Secondary | ICD-10-CM | POA: Diagnosis not present

## 2015-11-06 LAB — TSH: TSH: 1.16 u[IU]/mL (ref 0.35–4.50)

## 2015-11-06 LAB — BASIC METABOLIC PANEL
BUN: 18 mg/dL (ref 6–23)
CO2: 30 mEq/L (ref 19–32)
Calcium: 9.6 mg/dL (ref 8.4–10.5)
Chloride: 96 mEq/L (ref 96–112)
Creatinine, Ser: 0.72 mg/dL (ref 0.40–1.20)
GFR: 86.87 mL/min (ref >60.00)
Glucose, Bld: 100 mg/dL — ABNORMAL HIGH (ref 70–99)
Potassium: 4.3 mEq/L (ref 3.5–5.1)
Sodium: 134 mEq/L — ABNORMAL LOW (ref 135–145)

## 2015-11-06 LAB — CBC WITH DIFFERENTIAL/PLATELET
Basophils Absolute: 0 10*3/uL (ref 0.0–0.1)
Basophils Relative: 0.9 % (ref 0.0–3.0)
Eosinophils Absolute: 0.3 10*3/uL (ref 0.0–0.7)
Eosinophils Relative: 6.2 % — ABNORMAL HIGH (ref 0.0–5.0)
HCT: 36.4 % (ref 36.0–46.0)
Hemoglobin: 12.3 g/dL (ref 12.0–15.0)
Lymphocytes Relative: 22.8 % (ref 12.0–46.0)
Lymphs Abs: 1 10*3/uL (ref 0.7–4.0)
MCHC: 33.9 g/dL (ref 30.0–36.0)
MCV: 91.6 fl (ref 78.0–100.0)
Monocytes Absolute: 0.7 10*3/uL (ref 0.1–1.0)
Monocytes Relative: 15.5 % — ABNORMAL HIGH (ref 3.0–12.0)
Neutro Abs: 2.3 10*3/uL (ref 1.4–7.7)
Neutrophils Relative %: 54.6 % (ref 43.0–77.0)
Platelets: 367 10*3/uL (ref 150.0–400.0)
RBC: 3.98 Mil/uL (ref 3.87–5.11)
RDW: 14.1 % (ref 11.5–15.5)
WBC: 4.3 10*3/uL (ref 4.0–10.5)
nRBC: 0 /100 WBC (ref 0–4)

## 2015-11-06 LAB — URINALYSIS
Bilirubin Urine: NEGATIVE
Hgb urine dipstick: NEGATIVE
Ketones, ur: NEGATIVE
Leukocytes, UA: NEGATIVE
Nitrite: NEGATIVE
Specific Gravity, Urine: 1.01 (ref 1.000–1.030)
Total Protein, Urine: NEGATIVE
Urine Glucose: NEGATIVE
Urobilinogen, UA: 0.2 (ref 0.0–1.0)
pH: 7 (ref 5.0–8.0)

## 2015-11-06 LAB — LIPID PANEL
Cholesterol: 227 mg/dL — ABNORMAL HIGH (ref 0–200)
HDL: 95.2 mg/dL (ref >39.00)
LDL Cholesterol: 122 mg/dL — ABNORMAL HIGH (ref 0–99)
NonHDL: 132.05
Total CHOL/HDL Ratio: 2
Triglycerides: 48 mg/dL (ref 0.0–149.0)
VLDL: 9.6 mg/dL (ref 0.0–40.0)

## 2015-11-06 LAB — HEPATIC FUNCTION PANEL
ALT: 15 U/L (ref 0–35)
AST: 26 U/L (ref 0–37)
Albumin: 4.4 g/dL (ref 3.5–5.2)
Alkaline Phosphatase: 74 U/L (ref 39–117)
Bilirubin, Direct: 0.1 mg/dL (ref 0.0–0.3)
Total Bilirubin: 0.3 mg/dL (ref 0.2–1.2)
Total Protein: 6.7 g/dL (ref 6.0–8.3)

## 2015-11-06 LAB — IBC PANEL
Iron: 77 ug/dL (ref 42–145)
Saturation Ratios: 21.7 % (ref 20.0–50.0)
Transferrin: 254 mg/dL (ref 212.0–360.0)

## 2015-11-06 MED ORDER — FAMOTIDINE 20 MG PO TABS: ORAL_TABLET | ORAL | Status: DC

## 2015-11-06 MED ORDER — ASPIRIN EC 81 MG PO TBEC: 81.0000 mg | DELAYED_RELEASE_TABLET | Freq: Every day | ORAL | Status: DC

## 2015-11-06 NOTE — Progress Notes (Signed)
Subjective:    Patient ID: Kayla [name], female    DOB: 06-Feb-1951   MRN: [id number]   Brief patient profile:  50 yowf never smoker with MS since Oct 96 all neuro care through Dr [name] in [facility] ([facility] of [facility]),   [name] at [facility] urology,  [facility]  Followed here for Primary care/hypothyroid     History of Present Illness  October 04, 2009 Followup with PFT's. Pt states that her breathing is no better since last seen June 2011 for acute visit. She states she is still wheezing- ventolin helps and is using this up to tid. w/u c/w anemia  Mild dysphagia no more than usual. occ waking up wheezing at night despite zegerid at hs.   rec  Try  use dulera 100 2 bid prn   September 18 2015  R THR [name]    10/01/2015  f/u ov/[name] re: sob s/p et on dulera 100 2bid and dexilant q am  Chief Complaint  Patient presents with  . Acute Visit    Pt c/o increased DOE since had hip replacement 09/18/15. She states she gets SOB with just talking, showering or walking short distances.   has cpap machine but not using at all  - prev eval and rx by Dr [name]  rec Add pepcid 20 mg x one at bedtime Prednisone 10 mg take  4 each am x 2 days,   2 each am x 2 days,  1 each am x 2 days and stop  Start back on cpap See Dr [name] asap  11/06/2015  f/u ov/[name] re:  cpx  Chief Complaint  Patient presents with  . Follow-up    cpx- pt states she is doing well today, no complaints.     breathing better at night on cpap per Dr [name]  Doe = wm shopping / was not limited at curves prior to hip surgery but has not resumed saba once a week / no cough      No obvious day to day or daytime variabilty in sob  or assoc excess/ purulent sputum or mucus plugs   or   cp or chest tightness, subjective wheeze or overt  hb symptoms. No unusual exp hx or h/o childhood pna/ asthma or knowledge of premature birth.  Sleeping ok without nocturnal  or early am exacerbation  of respiratory  c/o's or need for noct saba. Also  denies any obvious fluctuation of symptoms with weather or environmental changes or other aggravating or alleviating factors except as outlined above   Current Medications, Allergies, Complete Past Medical History, Past Surgical History, Family History, and Social History were reviewed in [employer] record.    ROS  The following are not active complaints unless bolded sore throat, dysphagia, dental problems, itching, sneezing,  nasal congestion or excess/ purulent secretions, ear ache,   fever, chills, sweats, unintended wt loss, pleuritic or exertional cp, hemoptysis,  orthopnea pnd or leg swelling, presyncope, palpitations, heartburn, abdominal pain, anorexia, nausea, vomiting, diarrhea  or change in bowel or urinary habits, change in stools or urine, dysuria,hematuria,  rash, arthralgias, visual complaints, headache, numbness weakness or ataxia or problems with walking uses cane  or coordination,  change in mood/affect or memory.       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  [name]  Dyspnea/wheeze ? asthma  - HFA 50 % after coaching October 04, 2009 >  50% 06/25/2010  - PFT's October 04, 2009 FEV1 2.15 (95%) with ratio 69 and DLCO 58> corrects to 98, mild insp truncation  Health Maintenance.............................................Marland KitchenWert  - Pneumovax March 28, 2009 and Prevnar 13 11/06/2015  - Td  April 10, 2009  - CPX 11/06/2015        Objective:   Physical Exam  amb  white female nad / walks with cane   Wt  118 > 127 March 28, 2009 >129 June 17 > 128 October 04, 2009 > 129 06/25/2010 >125 10/29/2010 > 128 11/26/2010 >>127 12/10/2010 > 11/17/2011  129> 126 05/20/2012 > 07/29/2012  129 > 10/20/2012  129 >  135 06/21/2013 >  11/04/2013 135 >134 04/04/2014 > 04/18/2014 135 >  07/17/2014 132> 08/17/2014  135 > 12/20/2014 > 01/17/2015 134   > 08/08/2015   126 >  10/01/2015   130 > 11/06/2015   130    HEENT: nl dentition, turbinates, and orophanx. Nl external ear canals without cough reflex  Neck without JVD/Nodes/TM  Lungs clear with only transmitted noises from upper airway  RRR no s3 or murmur or increase in P2  Abd soft and benign with nl excursion in the supine position. No bruits or organomegaly / no cvat Ext warm hands, sym cool feet with nl pulses and  without calf tenderness, cyanosis clubbing - both feet with trace Pitting/ sym edema and slt  purplish discoloration Skin warm and dry without lesions Neuro:  nl sensorium, walks with cane, no obvious motor deficits / nl dtrs             Labs ordered/ reviewed:    Chemistry      Component Value Date/Time   NA 134 (L) 11/06/2015 0944   K 4.3 11/06/2015 0944   CL 96 11/06/2015 0944   CO2 30 11/06/2015 0944   BUN 18 11/06/2015 0944   CREATININE 0.72 11/06/2015 0944      Component Value Date/Time   CALCIUM 9.6 11/06/2015 0944   ALKPHOS 74 11/06/2015 0944   AST 26 11/06/2015 0944   ALT 15 11/06/2015 0944   BILITOT 0.3 11/06/2015 0944        Lab Results  Component Value Date   WBC 4.3 11/06/2015   HGB 12.3 11/06/2015   HCT 36.4 11/06/2015   MCV 91.6 11/06/2015   PLT 367.0 11/06/2015      Lab Results  Component Value Date   TSH 1.16 11/06/2015     Lab Results  Component Value Date   PROBNP 40.0 08/08/2015       Lab Results  Component Value Date   CHOL 227 (H) 11/06/2015   HDL 95.20 11/06/2015   LDLCALC 122 (H) 11/06/2015   LDLDIRECT 116.1 06/25/2010   TRIG 48.0 11/06/2015   CHOLHDL 2 11/06/2015               Assessment & Plan:

## 2015-11-06 NOTE — Patient Instructions (Addendum)
Prevnar 13 today  Please remember to go to the lab  department downstairs for your tests - we will call you with the results when they are available.  Let us know if you the med list does not accurately reflect all of your medications you use at home   Please schedule a follow up visit in 3 months but call sooner if needed

## 2015-11-07 ENCOUNTER — Encounter: Payer: Self-pay | Admitting: Internal Medicine

## 2015-11-07 NOTE — Assessment & Plan Note (Signed)
-  HFA 50 % after coaching October 04, 2009 >  Better on dulera 100 > hfa  50% 06/25/2010  - PFT's October 04, 2009 FEV1 2.15 (95%) with ratio 69 and DLCO 58> corrects to 98, mild insp truncation  - 07/17/2014 p extensive coaching HFA effectiveness =    75%   All goals of chronic asthma control met including optimal function and elimination of symptoms with minimal need for rescue therapy.  Contingencies discussed in full including contacting this office immediately if not controlling the symptoms using the rule of two's.

## 2015-11-07 NOTE — Assessment & Plan Note (Signed)
Due prevnar 13 today given asthma and risk of pna from MS/ variable cough mechanics  Otherwise utd/ no changes needed

## 2015-11-07 NOTE — Assessment & Plan Note (Signed)
Adequate control on present rx, reviewed > no change in rx needed   

## 2015-11-07 NOTE — Assessment & Plan Note (Signed)
-   ENT f/u [name] > improved on cpap as of 11/06/2015

## 2015-11-10 ENCOUNTER — Emergency Department (HOSPITAL_COMMUNITY): Payer: BLUE CROSS/BLUE SHIELD

## 2015-11-10 ENCOUNTER — Emergency Department (HOSPITAL_COMMUNITY): Payer: BLUE CROSS/BLUE SHIELD | Admitting: Anesthesiology

## 2015-11-10 ENCOUNTER — Ambulatory Visit (HOSPITAL_COMMUNITY)
Admission: EM | Admit: 2015-11-10 | Discharge: 2015-11-10 | Disposition: A | Discharge status: Still In Hospital | Payer: BLUE CROSS/BLUE SHIELD | Attending: Emergency Medicine | Admitting: Emergency Medicine

## 2015-11-10 ENCOUNTER — Encounter (HOSPITAL_COMMUNITY): Payer: Self-pay | Admitting: Emergency Medicine

## 2015-11-10 ENCOUNTER — Encounter (HOSPITAL_COMMUNITY)
Admission: EM | Disposition: A | Discharge status: Home / Self Care | Payer: Self-pay | Source: Home / Self Care | Attending: Emergency Medicine

## 2015-11-10 DIAGNOSIS — G4730 Sleep apnea, unspecified: Secondary | ICD-10-CM | POA: Diagnosis not present

## 2015-11-10 DIAGNOSIS — G35 Multiple sclerosis: Secondary | ICD-10-CM | POA: Insufficient documentation

## 2015-11-10 DIAGNOSIS — M1990 Unspecified osteoarthritis, unspecified site: Secondary | ICD-10-CM | POA: Diagnosis not present

## 2015-11-10 DIAGNOSIS — Y92096 Garden or yard of other non-institutional residence as the place of occurrence of the external cause: Secondary | ICD-10-CM | POA: Insufficient documentation

## 2015-11-10 DIAGNOSIS — Z79899 Other long term (current) drug therapy: Secondary | ICD-10-CM | POA: Insufficient documentation

## 2015-11-10 DIAGNOSIS — S73004A Unspecified dislocation of right hip, initial encounter: Secondary | ICD-10-CM

## 2015-11-10 DIAGNOSIS — Z96641 Presence of right artificial hip joint: Secondary | ICD-10-CM | POA: Diagnosis not present

## 2015-11-10 DIAGNOSIS — E039 Hypothyroidism, unspecified: Secondary | ICD-10-CM | POA: Diagnosis not present

## 2015-11-10 DIAGNOSIS — Z09 Encounter for follow-up examination after completed treatment for conditions other than malignant neoplasm: Secondary | ICD-10-CM

## 2015-11-10 DIAGNOSIS — J3802 Paralysis of vocal cords and larynx, bilateral: Secondary | ICD-10-CM

## 2015-11-10 DIAGNOSIS — Y831 Surgical operation with implant of artificial internal device as the cause of abnormal reaction of the patient, or of later complication, without mention of misadventure at the time of the procedure: Secondary | ICD-10-CM | POA: Diagnosis not present

## 2015-11-10 DIAGNOSIS — J449 Chronic obstructive pulmonary disease, unspecified: Secondary | ICD-10-CM | POA: Diagnosis not present

## 2015-11-10 DIAGNOSIS — M25551 Pain in right hip: Secondary | ICD-10-CM | POA: Diagnosis present

## 2015-11-10 DIAGNOSIS — T84020A Dislocation of internal right hip prosthesis, initial encounter: Secondary | ICD-10-CM | POA: Insufficient documentation

## 2015-11-10 DIAGNOSIS — Z7951 Long term (current) use of inhaled steroids: Secondary | ICD-10-CM | POA: Insufficient documentation

## 2015-11-10 DIAGNOSIS — E041 Nontoxic single thyroid nodule: Secondary | ICD-10-CM

## 2015-11-10 HISTORY — PX: HIP CLOSED REDUCTION: SHX983

## 2015-11-10 LAB — COMPREHENSIVE METABOLIC PANEL
ALT: 12 U/L — ABNORMAL LOW (ref 14–54)
AST: 49 U/L — ABNORMAL HIGH (ref 15–41)
Albumin: 4.2 g/dL (ref 3.5–5.0)
Alkaline Phosphatase: 66 U/L (ref 38–126)
Anion gap: 9 (ref 5–15)
BUN: 22 mg/dL — ABNORMAL HIGH (ref 6–20)
CO2: 24 mmol/L (ref 22–32)
Calcium: 8.9 mg/dL (ref 8.9–10.3)
Chloride: 99 mmol/L — ABNORMAL LOW (ref 101–111)
Creatinine, Ser: 0.72 mg/dL (ref 0.44–1.00)
GFR calc Af Amer: >60 mL/min (ref >60)
GFR calc non Af Amer: >60 mL/min (ref >60)
Glucose, Bld: 107 mg/dL — ABNORMAL HIGH (ref 65–99)
Potassium: 5 mmol/L (ref 3.5–5.1)
Sodium: 132 mmol/L — ABNORMAL LOW (ref 135–145)
Total Bilirubin: 1 mg/dL (ref 0.3–1.2)
Total Protein: 6.9 g/dL (ref 6.5–8.1)

## 2015-11-10 LAB — CBC WITH DIFFERENTIAL/PLATELET
Basophils Absolute: 0 10*3/uL (ref 0.0–0.1)
Basophils Relative: 0 %
Eosinophils Absolute: 0.2 10*3/uL (ref 0.0–0.7)
Eosinophils Relative: 4 %
HCT: 35 % — ABNORMAL LOW (ref 36.0–46.0)
Hemoglobin: 11.9 g/dL — ABNORMAL LOW (ref 12.0–15.0)
Lymphocytes Relative: 16 %
Lymphs Abs: 1 10*3/uL (ref 0.7–4.0)
MCH: 31 pg (ref 26.0–34.0)
MCHC: 34 g/dL (ref 30.0–36.0)
MCV: 91.1 fL (ref 78.0–100.0)
Monocytes Absolute: 0.8 10*3/uL (ref 0.1–1.0)
Monocytes Relative: 13 %
Neutro Abs: 4.1 10*3/uL (ref 1.7–7.7)
Neutrophils Relative %: 67 %
Platelets: 391 10*3/uL (ref 150–400)
RBC: 3.84 MIL/uL — ABNORMAL LOW (ref 3.87–5.11)
RDW: 13.5 % (ref 11.5–15.5)
WBC: 6.1 10*3/uL (ref 4.0–10.5)

## 2015-11-10 LAB — TYPE AND SCREEN
ABO/RH(D): O POS
Antibody Screen: NEGATIVE

## 2015-11-10 LAB — PROTIME-INR
INR: 1.02
Prothrombin Time: 13.4 seconds (ref 11.4–15.2)

## 2015-11-10 SURGERY — CLOSED MANIPULATION, JOINT, HIP
Anesthesia: General | Laterality: Right

## 2015-11-10 MED ORDER — SODIUM CHLORIDE 0.9 % IV SOLN
INTRAVENOUS | Status: AC | PRN
  Administered 2015-11-10: 1000 mL via INTRAVENOUS

## 2015-11-10 MED ORDER — PROPOFOL 10 MG/ML IV BOLUS
INTRAVENOUS | Status: AC
  Filled 2015-11-10: qty 20

## 2015-11-10 MED ORDER — FENTANYL CITRATE (PF) 100 MCG/2ML IJ SOLN: 25.0000 ug | INTRAMUSCULAR | Status: DC | PRN

## 2015-11-10 MED ORDER — FENTANYL CITRATE (PF) 100 MCG/2ML IJ SOLN
50.0000 ug | Freq: Once | INTRAMUSCULAR | Status: AC
  Administered 2015-11-10: 50 ug via INTRAVENOUS
  Filled 2015-11-10: qty 2

## 2015-11-10 MED ORDER — HYDROMORPHONE HCL 1 MG/ML IJ SOLN
1.0000 mg | INTRAMUSCULAR | Status: DC | PRN
  Administered 2015-11-10: 1 mg via INTRAVENOUS
  Filled 2015-11-10: qty 1

## 2015-11-10 MED ORDER — LIDOCAINE HCL (CARDIAC) 20 MG/ML IV SOLN
INTRAVENOUS | Status: AC
  Filled 2015-11-10: qty 5

## 2015-11-10 MED ORDER — PROPOFOL 10 MG/ML IV BOLUS
INTRAVENOUS | Status: AC | PRN
Start: 2015-11-10 — End: 2015-11-10
  Administered 2015-11-10: 50 mg via INTRAVENOUS
  Administered 2015-11-10: 100 mg via INTRAVENOUS

## 2015-11-10 MED ORDER — SUCCINYLCHOLINE CHLORIDE 20 MG/ML IJ SOLN
INTRAMUSCULAR | Status: DC | PRN
  Administered 2015-11-10: 100 mg via INTRAVENOUS

## 2015-11-10 MED ORDER — HYDROMORPHONE HCL 1 MG/ML IJ SOLN
1.0000 mg | Freq: Once | INTRAMUSCULAR | Status: AC
  Administered 2015-11-10: 1 mg via INTRAVENOUS
  Filled 2015-11-10: qty 1

## 2015-11-10 MED ORDER — PROPOFOL 10 MG/ML IV BOLUS
100.0000 mg | Freq: Once | INTRAVENOUS | Status: AC
  Administered 2015-11-10: 100 mg via INTRAVENOUS
  Filled 2015-11-10: qty 20

## 2015-11-10 MED ORDER — LIDOCAINE HCL (CARDIAC) 20 MG/ML IV SOLN
INTRAVENOUS | Status: DC | PRN
  Administered 2015-11-10: 50 mg via INTRATRACHEAL

## 2015-11-10 MED ORDER — LACTATED RINGERS IV SOLN
INTRAVENOUS | Status: DC | PRN
  Administered 2015-11-10: 21:00:00 via INTRAVENOUS

## 2015-11-10 MED ORDER — PROPOFOL 10 MG/ML IV BOLUS
INTRAVENOUS | Status: DC | PRN
  Administered 2015-11-10: 100 mg via INTRAVENOUS

## 2015-11-10 MED ORDER — KETAMINE HCL-SODIUM CHLORIDE 100-0.9 MG/10ML-% IV SOSY: 0.3000 mg/kg | PREFILLED_SYRINGE | Freq: Once | INTRAVENOUS | Status: DC

## 2015-11-10 MED ORDER — PROPOFOL 10 MG/ML IV BOLUS
100.0000 mg | Freq: Once | INTRAVENOUS | Status: AC
  Administered 2015-11-10: 50 mg via INTRAVENOUS
  Filled 2015-11-10: qty 20

## 2015-11-10 MED ORDER — ARTIFICIAL TEARS OP OINT
TOPICAL_OINTMENT | OPHTHALMIC | Status: AC
  Filled 2015-11-10: qty 3.5

## 2015-11-10 MED ORDER — ONDANSETRON HCL 4 MG/2ML IJ SOLN
INTRAMUSCULAR | Status: AC
  Filled 2015-11-10: qty 2

## 2015-11-10 MED ORDER — ONDANSETRON HCL 4 MG/2ML IJ SOLN: 4.0000 mg | Freq: Four times a day (QID) | INTRAMUSCULAR | Status: DC | PRN

## 2015-11-10 SURGICAL SUPPLY — 18 items
BANDAGE ADH SHEER 1  50/CT (GAUZE/BANDAGES/DRESSINGS) IMPLANT
DURAPREP 26ML APPLICATOR (WOUND CARE) ×2 IMPLANT
GAUZE SPONGE 4X4 12PLY STRL (GAUZE/BANDAGES/DRESSINGS) IMPLANT
GLOVE BIOGEL PI IND STRL 7.5 (GLOVE) ×1 IMPLANT
GLOVE BIOGEL PI IND STRL 8.5 (GLOVE) ×1 IMPLANT
GLOVE BIOGEL PI INDICATOR 7.5 (GLOVE) ×1
GLOVE BIOGEL PI INDICATOR 8.5 (GLOVE) ×1
GLOVE ECLIPSE 8.0 STRL XLNG CF (GLOVE) IMPLANT
GLOVE ORTHO TXT STRL SZ7.5 (GLOVE) ×4 IMPLANT
GLOVE SURG ORTHO 8.0 STRL STRW (GLOVE) ×2 IMPLANT
GOWN SPEC L3 XXLG W/TWL (GOWN DISPOSABLE) ×4 IMPLANT
GOWN STRL REUS W/TWL LRG LVL3 (GOWN DISPOSABLE) ×2 IMPLANT
MANIFOLD NEPTUNE II (INSTRUMENTS) ×2 IMPLANT
NDL SAFETY ECLIPSE 18X1.5 (NEEDLE) IMPLANT
NEEDLE HYPO 18GX1.5 SHARP (NEEDLE)
POSITIONER SURGICAL ARM (MISCELLANEOUS) ×2 IMPLANT
SYR CONTROL 10ML LL (SYRINGE) IMPLANT
TOWEL OR 17X26 10 PK STRL BLUE (TOWEL DISPOSABLE) ×4 IMPLANT

## 2015-11-10 NOTE — Anesthesia Preprocedure Evaluation (Signed)
Anesthesia Evaluation  Patient identified by MRN, date of birth, ID band Patient awake    Reviewed: Allergy & Precautions, NPO status , Patient's Chart, lab work & pertinent test results  Airway Mallampati: II   Neck ROM: full    Dental   Pulmonary shortness of breath, asthma , sleep apnea , COPD,    breath sounds clear to auscultation       Cardiovascular  Rhythm:regular Rate:Normal     Neuro/Psych    GI/Hepatic   Endo/Other  Hypothyroidism   Renal/GU      Musculoskeletal  (+) Arthritis ,   Abdominal   Peds  Hematology   Anesthesia Other Findings   Reproductive/Obstetrics                             Anesthesia Physical Anesthesia Plan  ASA: II  Anesthesia Plan: General   Post-op Pain Management:    Induction: Intravenous  Airway Management Planned: Oral ETT  Additional Equipment:   Intra-op Plan:   Post-operative Plan: Extubation in OR  Informed Consent: I have reviewed the patients History and Physical, chart, labs and discussed the procedure including the risks, benefits and alternatives for the proposed anesthesia with the patient or authorized representative who has indicated his/her understanding and acceptance.     Plan Discussed with: CRNA, Anesthesiologist and Surgeon  Anesthesia Plan Comments:         Anesthesia Quick Evaluation

## 2015-11-10 NOTE — Progress Notes (Signed)
PACU Nsg Note: Pt ready for DC to home per MD orders, pt fully awake, oriented x 4, pain under control, pt tolerating POs well. Pt teaching done re: (1) post anesthesia diet upon arrival to home (2) importance of making post op MD appt as per surgeons orders, (3) when to call Ortho MD on call, I.e. Acute pain in rt hip, pain not controlled by medications per MD, circulation issues (4) importance of wearing leg immobilizer as ordered by MD (5) ambulation and movement with hip precautions. Family, husband with pt, pt escorted via w/c to private vehicle, opportunity to ask questions offered many times. Teach Back method used

## 2015-11-10 NOTE — Op Note (Signed)
Kayla [name], MCTEAGUE NO.:  [id number]  MEDICAL RECORD NO.:  [id number]  LOCATION:  [facility]                         FACILITY:  [facility]  PHYSICIAN:  Rod Can, MD     DATE OF BIRTH:  [birth year]  DATE OF PROCEDURE:  11/10/2015 DATE OF DISCHARGE:                              OPERATIVE REPORT   SURGEON:  Rod Can, MD  ASSISTANT:  Staff.  PREOPERATIVE DIAGNOSIS:  Periprosthetic right total hip dislocation.  POSTOPERATIVE DIAGNOSIS:  Periprosthetic right total hip dislocation.  PROCEDURE PERFORMED:  Closed reduction of dislocated right total hip arthroplasty.  ANESTHESIA:  General.  EBL:  None.  ANTIBIOTICS:  Not indicated.  COMPLICATIONS:  None.  TUBES AND DRAINS:  None.  DISPOSITION:  Stable to PACU.  INDICATIONS:  The patient is a 65 year old female who underwent remote hemiarthroplasty for femoral neck fracture.  She suffered 2 dislocations in the early postoperative period.  She then went on to do very well until she developed osteoarthritis of the right hip.  Dr. Alvan Dame converted her to a total hip replacement in June of 2017.  She was recently seen for a postoperative evaluation and found to be doing well.  She was gardening earlier today and when she was bent over at the waist pulling weeds, she felt a pop in her hip and she fell down.  She was unable to weight bear.  She had hip pain.  She was brought to the emergency department and x-rays revealed a dislocated total hip arthroplasty.  She had unsuccessful attempt at closed reduction in the emergency department by the emergency department staff and by myself.  She was then indicated for closed reduction in the operating room.  Risks, benefits, alternatives were explained to her and her family, and they elected to proceed.  DESCRIPTION OF PROCEDURE IN DETAIL:  I identified the patient in the holding area using 2 identifiers.  She was taken to the operating room and general anesthesia was  induced on the stretcher.  She was then transferred to the operating room table.  Time-out was called verifying side and site of surgery.  I was able to reduce the hip with a single reduction maneuver.  This was done with traction and rotation.  The hip reduced with an audible and palpable clunk.  Fluoroscopy was used in the operating room to determine that the hip had a concentric reduction. She did have a very stable reduction.  A knee immobilizer was applied. She was then awakened from anesthesia and transferred to the PACU in stable condition.  Sponge, needle, and instrument counts were not conducted as it was not indicated.  IV antibiotics were not given as they were not indicated.  Operative events and findings were discussed with the patient's family. She may weightbear as tolerated.  She should wear the knee immobilizer at all times except to remove it once daily to check her skin.  She will follow up in the office with Dr. Alvan Dame in 10-14 days.  All questions were solicited and answered.          ______________________________ Rod Can, MD     BS/MEDQ  D:  11/10/2015  T:  11/10/2015  Job:  [id number]

## 2015-11-10 NOTE — Anesthesia Procedure Notes (Signed)
Procedure Name: Intubation Date/Time: 11/10/2015 8:45 PM Performed by: Dione Booze Pre-anesthesia Checklist: Patient identified, Emergency Drugs available, Suction available and Patient being monitored Patient Re-evaluated:Patient Re-evaluated prior to inductionOxygen Delivery Method: Circle system utilized Preoxygenation: Pre-oxygenation with 100% oxygen Intubation Type: IV induction Laryngoscope Size: Mac and 4 Grade View: Grade II Number of attempts: 1 Airway Equipment and Method: Stylet Placement Confirmation: ETT inserted through vocal cords under direct vision Secured at: 21 cm Tube secured with: Tape Dental Injury: Teeth and Oropharynx as per pre-operative assessment

## 2015-11-10 NOTE — ED Provider Notes (Signed)
Reduction of dislocation Date/Time: 4:32 PM Performed by: Varney Biles Authorized by: Varney Biles Consent: Written consent obtained. Risks and benefits: risks, benefits and alternatives were discussed - Risks include: other orthopedic injuries, pain, need for further procedures Consent given by: patient Required items: required blood products, implants, devices, and special equipment available Time out: Immediately prior to procedure a "time out" was called to verify the correct patient, procedure, equipment, support staff and site/side marked as required.  Patient sedated: Yes  Vitals: Vital signs were monitored during sedation. Patient tolerance: Patient tolerated the procedure well with no immediate complications. Joint: Unsuccessful Reduction technique: traction-counter traction and captain morgan hip reductoin technique     Varney Biles, MD 11/10/15 1635

## 2015-11-10 NOTE — Brief Op Note (Signed)
11/10/2015  8:59 PM  PATIENT:  [name]  65 y.o. female  PRE-OPERATIVE DIAGNOSIS:  dislocated right hip  POST-OPERATIVE DIAGNOSIS:  same  PROCEDURE:  Procedure(s): CLOSED MANIPULATION HIP (Right)  SURGEON:  Surgeon(s) and Role:    * Rod Can, MD - Primary  PHYSICIAN ASSISTANT:   ASSISTANTS: none   ANESTHESIA:   general  EBL:  No intake/output data recorded.  BLOOD ADMINISTERED:none  DRAINS: none   LOCAL MEDICATIONS USED:  NONE  SPECIMEN:  No Specimen  DISPOSITION OF SPECIMEN:  N/A  COUNTS:  YES  TOURNIQUET:  * No tourniquets in log *  DICTATION: .Other Dictation: Dictation Number [id number]  PLAN OF CARE: Discharge to home after PACU  PATIENT DISPOSITION:  PACU - hemodynamically stable.   Delay start of Pharmacological VTE agent (>24hrs) due to surgical blood loss or risk of bleeding: not applicable

## 2015-11-10 NOTE — ED Notes (Signed)
Bed: [id number] Expected date: 11/10/15 Expected time: 2:19 PM Means of arrival:  Comments: Hip injury

## 2015-11-10 NOTE — ED Notes (Signed)
Pt. Transported to OR.

## 2015-11-10 NOTE — Discharge Instructions (Signed)
POSTERIOR HIP PRECAUTIONS WEAR KNEE IMMOBILIZER AT ALL TIMES. May remove once daily to check skin.

## 2015-11-10 NOTE — ED Provider Notes (Signed)
[facility] DEPT Provider Note   CSN: [id number] Arrival date & time: 11/10/15  1416     History   Chief Complaint Chief Complaint  Patient presents with  . Hip Injury    HPI Kayla [name] is a 65 y.o. female hx of multiple sclerosis, right hip replacement with revision in June here with R hip pain. She was in the garden and bent over pass 90 degrees and then heard a pop and then noticed that her right hip popped out. Has severe pain afterwards and unable to bear weight. Had previous right hip replacement several years ago and had several episodes of hip dislocation. She had revision surgery in June this year by Dr. Alvan Dame. Denies any head trauma today. Given 200 mcg fentanyl by EMS prior to arrival.   The history is provided by the patient.    Past Medical History:  Diagnosis Date  . Arthritis    oa  . Cervical cancer ([place]) 2005  . COPD (chronic obstructive pulmonary disease) ([place])   . Multiple sclerosis ([place])   . Osteoporosis   . Other diseases of vocal cords    vocal cords are not even  . Other voice and resonance disorders   . Pneumonia 2014, 1992  . Sleep apnea    no cpap used  . Unspecified hypothyroidism     Patient Active Problem List   Diagnosis Date Noted  . S/P revision right [facility] 09/18/2015  . Leg swelling 08/21/2014  . Acute URI 04/04/2014  . Health care maintenance 12/16/2013  . Other and unspecified hyperlipidemia 11/04/2013  . 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 S/P hip hemiarthroplasty 03/15/2013  . Dislocation of hip prosthesis ([place]) 03/12/2013  . Hip dislocation, left ([place]) 03/12/2013  . Hip dislocation, right ([place]) 03/12/2013  . Urinary retention with incomplete bladder emptying 03/09/2013  . Dysphagia, pharyngoesophageal phase 03/09/2013  . Femoral neck fracture ([place]) 03/08/2013  . Unspecified constipation 02/26/2013  . Fractured femoral neck ([place]) 02/23/2013  . Hip fracture ([place]) 02/23/2013  . Excessive flatus 10/21/2012  . Cold feet  07/29/2012  . Hyponatremia 05/20/2012  . Calf pain 11/17/2011  . Dyspnea 12/06/2007  . OSTEOPENIA 09/17/2007  . Hypothyroidism 04/01/2007  . Multiple sclerosis ([place]) 04/01/2007  . Other diseases of vocal cords 04/01/2007    Past Surgical History:  Procedure Laterality Date  . ABDOMINAL HYSTERECTOMY  2005   for cervical cancer, complete  . CONVERSION TO TOTAL HIP Right 09/18/2015   Procedure: CONVERSION OF RIGHT HEMI ARTHROPLASTY TO TOTAL HIP ARTHROPLASTY ACETABULAR REVISON ;  Surgeon: Paralee Cancel, MD;  Location: [facility];  Service: Orthopedics;  Laterality: Right;  . EYE SURGERY Bilateral    lens for cataracts  . HIP ARTHROPLASTY Right 03/01/2013   Procedure: ARTHROPLASTY BIPOLAR HIP;  Surgeon: Mauri Pole, MD;  Location: [facility];  Service: Orthopedics;  Laterality: Right;  . HIP CLOSED REDUCTION Right 03/12/2013   Procedure: CLOSED REDUCTION HIP;  Surgeon: Gearlean Alf, MD;  Location: [facility];  Service: Orthopedics;  Laterality: Right;  . HIP CLOSED REDUCTION Right 03/19/2013   Procedure: CLOSED REDUCTION HIP;  Surgeon: Johnn Hai, MD;  Location: [facility];  Service: Orthopedics;  Laterality: Right;  Marland Kitchen VESICOVAGINAL FISTULA CLOSURE W/ TAH  2005    OB History    No data available       Home Medications    Prior to Admission medications   Medication Sig Start Date End Date Taking? Authorizing Provider  albuterol (VENTOLIN HFA) 108 (90 Base)  MCG/ACT inhaler Inhale 2 puffs into the lungs every 6 (six) hours as needed for wheezing or shortness of breath.    Historical Provider, MD  amphetamine-dextroamphetamine (ADDERALL) 20 MG tablet Take 20 mg by mouth daily.    Historical Provider, MD  aspirin EC 81 MG tablet Take 1 tablet (81 mg total) by mouth daily. 11/06/15   Tanda Rockers, MD  baclofen (LIORESAL) 20 MG tablet Take 20 mg by mouth 3 (three) times daily.  03/30/13   [name], PA-C  calcium-vitamin D (OSCAL WITH D) 500-200 MG-UNIT per tablet Take 1 tablet by mouth daily  with breakfast.    Historical Provider, MD  cholecalciferol (VITAMIN D) 1000 UNITS tablet Take 2,000 Units by mouth daily.     Historical Provider, MD  DEXILANT 60 MG capsule TAKE 1 CAPSULE EACH MORNING BEFORE BREAKFAST 01/22/15   Tanda Rockers, MD  DULERA 100-5 MCG/ACT AERO INHALE 2 PUFFS IN THE AM AND 2 PUFFS IN THE PM ABOUT 12 HOURS APART. Patient taking differently: INHALE 2 PUFFS IN THE AM AND 2 PUFFS IN THE PM ABOUT 12 HOURS APART as needed for shortness of breath    Tanda Rockers, MD  famotidine (PEPCID) 20 MG tablet One at bedtime 11/06/15   Tanda Rockers, MD  FLUoxetine (PROZAC) 40 MG capsule Take 40 mg by mouth daily.      Historical Provider, MD  furosemide (LASIX) 20 MG tablet TAKE 1 TABLET ONCE DAILY. 09/03/15   Tanda Rockers, MD  gabapentin (NEURONTIN) 600 MG tablet Take 600 mg by mouth 2 (two) times daily.    Historical Provider, MD  Lactobacillus (ACIDOPHILUS) CAPS capsule Take 1 capsule by mouth daily.    Historical Provider, MD  levothyroxine (SYNTHROID, LEVOTHROID) 100 MCG tablet TAKE 1 TABLET ONCE DAILY BEFORE BREAKFAST. 09/03/15   Tanda Rockers, MD  multivitamin-iron-minerals-folic acid (CENTRUM) chewable tablet Chew 1 tablet by mouth daily.    Historical Provider, MD  ocrelizumab (OCREVUS) 300 MG/10ML injection Inject 300 mg into the vein once. Every 6 months    Historical Provider, MD    Family History Family History  Problem Relation Age of Onset  . Colon cancer Father     Social History Social History  Substance Use Topics  . Smoking status: Never Smoker  . Smokeless tobacco: Never Used  . Alcohol use Yes     Comment: rare     Allergies   Iohexol; Methylprednisolone; Morphine and related; Oxycodone-acetaminophen; Phenytoin sodium extended; and Zanaflex [tizanidine hydrochloride]   Review of Systems Review of Systems  Musculoskeletal:       R hip pain   All other systems reviewed and are negative.    Physical Exam Updated Vital Signs BP 101/69    Pulse 67   Temp 97.9 F (36.6 C) (Oral)   Resp 17   Ht 5\' 3"  (1.6 m)   Wt 127 lb (57.6 kg)   SpO2 100%   BMI 22.50 kg/m   Physical Exam  Constitutional: She is oriented to person, place, and time.  Uncomfortable   HENT:  Head: Normocephalic and atraumatic.  Eyes: EOM are normal. Pupils are equal, round, and reactive to light.  Neck: Normal range of motion. Neck supple.  Cardiovascular: Normal rate, regular rhythm and normal heart sounds.   Pulmonary/Chest: Effort normal and breath sounds normal. No respiratory distress. She has no wheezes. She has no rales.  Abdominal: Soft. Bowel sounds are normal.  Musculoskeletal:  R hip dislocated, internally rotated.  2+ pulses. Able to wiggle toes   Neurological: She is alert and oriented to person, place, and time.  Skin: Skin is warm.  Psychiatric: She has a normal mood and affect.  Nursing note and vitals reviewed.    ED Treatments / Results  Labs (all labs ordered are listed, but only abnormal results are displayed) Labs Reviewed  CBC WITH DIFFERENTIAL/PLATELET - Abnormal; Notable for the following:       Result Value   RBC 3.84 (*)    Hemoglobin 11.9 (*)    HCT 35.0 (*)    All other components within normal limits  COMPREHENSIVE METABOLIC PANEL - Abnormal; Notable for the following:    Sodium 132 (*)    Chloride 99 (*)    Glucose, Bld 107 (*)    BUN 22 (*)    AST 49 (*)    ALT 12 (*)    All other components within normal limits  PROTIME-INR  TYPE AND SCREEN    EKG  EKG Interpretation None       Radiology Dg Hip Unilat W Or Wo Pelvis 2-3 Views Right  Result Date: 11/10/2015 CLINICAL DATA:  Right hip pain today EXAM: DG HIP (WITH OR WITHOUT PELVIS) 2-3V RIGHT COMPARISON:  09/18/2015 FINDINGS: Three views of the right hip submitted. There is superior subluxation of femoral component of right hip prosthesis. IMPRESSION: Superior subluxation of femoral component of right hip prosthesis. Electronically Signed   By:  Lahoma Crocker M.D.   On: 11/10/2015 15:32    Procedures Procedures (including critical care time)  Reduction of dislocation Date/Time: 4:39 PM Performed by: Wandra Arthurs Authorized by: Wandra Arthurs Consent: Verbal consent obtained. Risks and benefits: risks, benefits and alternatives were discussed Consent given by: patient Required items: required blood products, implants, devices, and special equipment available Time out: Immediately prior to procedure a "time out" was called to verify the correct patient, procedure, equipment, support staff and site/side marked as required.  Patient sedated: yes, propofol.   Vitals: Vital signs were monitored during sedation. Patient tolerance: Patient tolerated the procedure well with no immediate complications. Joint: R hip Reduction technique: traction, flexion. No success.   Procedural sedation Performed by: Wandra Arthurs Consent: Verbal consent obtained. Risks and benefits: risks, benefits and alternatives were discussed Required items: required blood products, implants, devices, and special equipment available Patient identity confirmed: arm band and provided demographic data Time out: Immediately prior to procedure a "time out" was called to verify the correct patient, procedure, equipment, support staff and site/side marked as required.  Sedation type: moderate (conscious) sedation NPO time confirmed and considedered  Sedatives: PROPOFOL  Physician Time at Bedside: 30 min   Vitals: Vital signs were monitored during sedation. Cardiac Monitor, pulse oximeter Patient tolerance: Patient tolerated the procedure well with no immediate complications. Comments: Pt with uneventful recovered. Returned to pre-procedural sedation baseline       Medications Ordered in ED Medications  fentaNYL (SUBLIMAZE) injection 50 mcg (50 mcg Intravenous Given 11/10/15 1503)  propofol (DIPRIVAN) 10 mg/mL bolus/IV push 100 mg (50 mg Intravenous Given 11/10/15  1626)  0.9 %  sodium chloride infusion (1,000 mLs Intravenous New Bag/Given 11/10/15 1513)  propofol (DIPRIVAN) 10 mg/mL bolus/IV push (50 mg Intravenous Given 11/10/15 1619)  fentaNYL (SUBLIMAZE) injection 50 mcg (50 mcg Intravenous Given 11/10/15 1617)  propofol (DIPRIVAN) 10 mg/mL bolus/IV push 100 mg (100 mg Intravenous Given 11/10/15 1513)     Initial Impression / Assessment and Plan / ED Course  I have reviewed the triage vital signs and the nursing notes.  Pertinent labs & imaging results that were available during my care of the patient were reviewed by me and considered in my medical decision making (see chart for details).  Clinical Course    RYONNA BORCK is a 65 y.o. female here with R hip dislocation. Hx of hip replacement. No head injury and didn't fall on the hip. Will get xrays and give pain meds.   3 pm Performed conscious sedation with hip reduction. But was unsuccessful. Will consult Ortho.  4:40 PM Dr. Lyla Glassing at bedside and asked me to perform another conscious sedation. Sedated with 100 mg of propofol. Briefly became apneic and I bagged her briefly and O2 sat came up. Now more awake. Xray still showed dislocation. Ortho to admit for OR reduction.      Final Clinical Impressions(s) / ED Diagnoses   Final diagnoses:  None    New Prescriptions New Prescriptions   No medications on file     Kayla Freeze, MD 11/10/15 1641

## 2015-11-10 NOTE — Transfer of Care (Signed)
Immediate Anesthesia Transfer of Care Note  Patient: [name]  Procedure(s) Performed: Procedure(s): CLOSED MANIPULATION HIP (Right)  Patient Location: PACU  Anesthesia Type:General  Level of Consciousness: awake, alert , oriented and patient cooperative  Airway & Oxygen Therapy: Patient Spontanous Breathing and Patient connected to face mask oxygen  Post-op Assessment: Report given to RN and Post -op Vital signs reviewed and stable  Post vital signs: Reviewed and stable  Last Vitals:  Vitals:   11/10/15 1915 11/10/15 1930  BP: 117/58 117/61  Pulse:    Resp:    Temp:      Last Pain:  Vitals:   11/10/15 1945  TempSrc:   PainSc: 10-Worst pain ever         Complications: No apparent anesthesia complications

## 2015-11-10 NOTE — H&P (Signed)
ORTHOPAEDIC H&P  REQUESTING PHYSICIAN: Drenda Freeze, MD  PCP:  Christinia Gully, MD  Chief Complaint: Periprosthetic right hip dislocation  HPI: Kayla [name] is a 65 y.o. female with a history of right hip hemiarthroplasty for displaced femoral neck fracture who developed subsequent osteoarthritis. She underwent conversion to total hip arthroplasty by Dr. Alvan Dame on 09/18/2015. She was doing very well until she was bent over pulling weeds earlier today in her yard. She then felt a pop in her right hip and fell to the ground. She had right hip pain and inability to weight-bear. She was brought to the emergency department at [facility] where x-rays revealed a periprosthetic right hip dislocation. She had an attempt at closed reduction under conscious sedation by the emergency department staff that was unsuccessful. Orthopedic consultation was then obtained for management of her dislocated right total hip arthroplasty. Patient denies other injuries. She denies numbness or tingling.  Past Medical History:  Diagnosis Date  . Arthritis    oa  . Cervical cancer ([place]) 2005  . COPD (chronic obstructive pulmonary disease) ([place])   . Multiple sclerosis ([place])   . Osteoporosis   . Other diseases of vocal cords    vocal cords are not even  . Other voice and resonance disorders   . Pneumonia 2014, 1992  . Sleep apnea    no cpap used  . Unspecified hypothyroidism    Past Surgical History:  Procedure Laterality Date  . ABDOMINAL HYSTERECTOMY  2005   for cervical cancer, complete  . CONVERSION TO TOTAL HIP Right 09/18/2015   Procedure: CONVERSION OF RIGHT HEMI ARTHROPLASTY TO TOTAL HIP ARTHROPLASTY ACETABULAR REVISON ;  Surgeon: Paralee Cancel, MD;  [facility];  Service: Orthopedics;  Laterality: Right;  . EYE SURGERY Bilateral    lens for cataracts  . HIP ARTHROPLASTY Right 03/01/2013   Procedure: ARTHROPLASTY BIPOLAR HIP;  Surgeon: Mauri Pole, MD;  [facility];  Service:  Orthopedics;  Laterality: Right;  . HIP CLOSED REDUCTION Right 03/12/2013   Procedure: CLOSED REDUCTION HIP;  Surgeon: Gearlean Alf, MD;  Location: [place];  Service: Orthopedics;  Laterality: Right;  . HIP CLOSED REDUCTION Right 03/19/2013   Procedure: CLOSED REDUCTION HIP;  Surgeon: Johnn Hai, MD;  Location: [place];  Service: Orthopedics;  Laterality: Right;  Marland Kitchen VESICOVAGINAL FISTULA CLOSURE W/ TAH  2005   Social History   Social History  . Marital status: Married    Spouse name: N/A  . Number of children: N/A  . Years of education: N/A   Occupational History  . disabled [place] History Main Topics  . Smoking status: Never Smoker  . Smokeless tobacco: Never Used  . Alcohol use Yes     Comment: rare  . Drug use: No  . Sexual activity: Not Asked   Other Topics Concern  . None   Social History Narrative  . None   Family History  Problem Relation Age of Onset  . Colon cancer Father    Allergies  Allergen Reactions  . Iohexol Hives and Rash    Patient broke out in hives/ rash   . Methylprednisolone Other (See Comments)    Pt states cannot take any steroids b/c they cause a decrease in her heart rate, when taken in very large quantities  . Morphine And Related Other (See Comments)    Excessive sedation. Hallucinations.  . Oxycodone-Acetaminophen Other (See Comments)    Rash head to  toe and hallucinations  . Phenytoin Sodium Extended Rash  . Zanaflex [Tizanidine Hydrochloride] Rash   Prior to Admission medications   Medication Sig Start Date End Date Taking? Authorizing Provider  albuterol (VENTOLIN HFA) 108 (90 Base) MCG/ACT inhaler Inhale 2 puffs into the lungs every 6 (six) hours as needed for wheezing or shortness of breath.    Historical Provider, MD  amphetamine-dextroamphetamine (ADDERALL) 20 MG tablet Take 20 mg by mouth daily.    Historical Provider, MD  aspirin EC 81 MG tablet Take 1 tablet (81 mg total) by mouth daily. 11/06/15    Tanda Rockers, MD  baclofen (LIORESAL) 20 MG tablet Take 20 mg by mouth 3 (three) times daily.  03/30/13   [name], PA-C  calcium-vitamin D (OSCAL WITH D) 500-200 MG-UNIT per tablet Take 1 tablet by mouth daily with breakfast.    Historical Provider, MD  cholecalciferol (VITAMIN D) 1000 UNITS tablet Take 2,000 Units by mouth daily.     Historical Provider, MD  DEXILANT 60 MG capsule TAKE 1 CAPSULE EACH MORNING BEFORE BREAKFAST 01/22/15   Tanda Rockers, MD  DULERA 100-5 MCG/ACT AERO INHALE 2 PUFFS IN THE AM AND 2 PUFFS IN THE PM ABOUT 12 HOURS APART. Patient taking differently: INHALE 2 PUFFS IN THE AM AND 2 PUFFS IN THE PM ABOUT 12 HOURS APART as needed for shortness of breath    Tanda Rockers, MD  famotidine (PEPCID) 20 MG tablet One at bedtime 11/06/15   Tanda Rockers, MD  FLUoxetine (PROZAC) 40 MG capsule Take 40 mg by mouth daily.      Historical Provider, MD  furosemide (LASIX) 20 MG tablet TAKE 1 TABLET ONCE DAILY. 09/03/15   Tanda Rockers, MD  gabapentin (NEURONTIN) 600 MG tablet Take 600 mg by mouth 2 (two) times daily.    Historical Provider, MD  Lactobacillus (ACIDOPHILUS) CAPS capsule Take 1 capsule by mouth daily.    Historical Provider, MD  levothyroxine (SYNTHROID, LEVOTHROID) 100 MCG tablet TAKE 1 TABLET ONCE DAILY BEFORE BREAKFAST. 09/03/15   Tanda Rockers, MD  multivitamin-iron-minerals-folic acid (CENTRUM) chewable tablet Chew 1 tablet by mouth daily.    Historical Provider, MD  ocrelizumab (OCREVUS) 300 MG/10ML injection Inject 300 mg into the vein once. Every 6 months    Historical Provider, MD   Dg Hip Unilat W Or Wo Pelvis 2-3 Views Right  Result Date: 11/10/2015 CLINICAL DATA:  Right hip pain today EXAM: DG HIP (WITH OR WITHOUT PELVIS) 2-3V RIGHT COMPARISON:  09/18/2015 FINDINGS: Three views of the right hip submitted. There is superior subluxation of femoral component of right hip prosthesis. IMPRESSION: Superior subluxation of femoral component of right hip  prosthesis. Electronically Signed   By: Lahoma Crocker M.D.   On: 11/10/2015 15:32    Positive ROS: All other systems have been reviewed and were otherwise negative with the exception of those mentioned in the HPI and as above.  Physical Exam: General: Alert, no acute distress Cardiovascular: No pedal edema Respiratory: No cyanosis, no use of accessory musculature GI: No organomegaly, abdomen is soft and non-tender Skin: No lesions in the area of chief complaint Neurologic: Sensation intact distally Psychiatric: Patient is competent for consent with normal mood and affect Lymphatic: No axillary or cervical lymphadenopathy  MUSCULOSKELETAL: Examination of the right hip reveals a healed incision. She has an obvious leg length discrepancy. Her lower extremity is shortened and internally rotated. She has positive motor function dorsiflexion, plantar flexion, and great toe extension.  She has a palpable pulse. She reports intact sensation. She does complain of right hip pain.  Assessment: Periprosthetic right hip dislocation  Plan: I discussed the findings with the patient and her family. I recommended a repeat attempt at closed reduction in the emergency department. After consent was obtained, the patient underwent conscious sedation by the emergency department staff. Closed reduction of the right hip was attempted by myself, and I was unable to reduce the hip successfully. Postreduction pelvis x-ray reveals persistent dislocation of right total hip arthroplasty. No periprosthetic fractures.  I recommended going to the operating room for closed reduction of the right hip under general anesthesia. We discussed the risks, benefits, and alternatives. They understand the biggest risk is future hip instability. We will plan for surgery around 7 PM tonight, as she last ate at 11 AM. We will plan for discharge from the PACU. All questions were solicited and answered.    Ayce Pietrzyk, Horald Pollen, MD Cell  [phone]    11/10/2015 4:35 PM

## 2015-11-10 NOTE — ED Triage Notes (Signed)
Per EMS- Patient reports that she was gardening, sat down and felt a pop. Patient reports that right hip replacement 6/17. Patient was given Fentanyl 200 mcg IV and NS 275 ml. Right leg shortening and rotation noted.

## 2015-11-12 ENCOUNTER — Encounter (HOSPITAL_COMMUNITY): Payer: Self-pay | Admitting: Orthopedic Surgery

## 2015-11-12 NOTE — Anesthesia Postprocedure Evaluation (Signed)
Anesthesia Post Note  Patient: Kayla [name]  Procedure(s) Performed: Procedure(s) (LRB): CLOSED MANIPULATION HIP (Right)  Patient location during evaluation: PACU Anesthesia Type: General Level of consciousness: awake and alert and patient cooperative Pain management: pain level controlled Vital Signs Assessment: post-procedure vital signs reviewed and stable Respiratory status: spontaneous breathing and respiratory function stable Cardiovascular status: stable Anesthetic complications: no    Last Vitals:  Vitals:   11/10/15 2145 11/10/15 2200  BP:    Pulse:    Resp:    Temp: 36.6 C 36.6 C    Last Pain:  Vitals:   11/10/15 1945  TempSrc:   PainSc: 10-Worst pain ever                 Brailyn Killion S

## 2015-11-19 ENCOUNTER — Emergency Department
Admission: RE | Admit: 2015-11-19 | Discharge: 2015-11-19 | Disposition: A | Discharge status: Home / Self Care | Payer: BLUE CROSS/BLUE SHIELD | Source: Ambulatory Visit | Attending: Otolaryngology | Admitting: Otolaryngology

## 2015-11-19 DIAGNOSIS — R061 Stridor: Secondary | ICD-10-CM

## 2015-11-19 DIAGNOSIS — J3802 Paralysis of vocal cords and larynx, bilateral: Secondary | ICD-10-CM

## 2015-11-19 MED ORDER — IOPAMIDOL (ISOVUE-300) INJECTION 61%
75.0000 mL | Freq: Once | INTRAVENOUS | Status: AC | PRN
  Administered 2015-11-19: 75 mL via INTRAVENOUS

## 2015-12-18 ENCOUNTER — Emergency Department
Admit: 2015-12-18 | Discharge: 2015-12-18 | Disposition: A | Discharge status: Home / Self Care | Payer: BLUE CROSS/BLUE SHIELD | Attending: Otolaryngology | Admitting: Otolaryngology

## 2015-12-26 ENCOUNTER — Other Ambulatory Visit (HOSPITAL_COMMUNITY): Admission: RE | Admit: 2015-12-26 | Payer: BLUE CROSS/BLUE SHIELD | Source: Ambulatory Visit | Admitting: Radiology

## 2015-12-26 ENCOUNTER — Other Ambulatory Visit (HOSPITAL_COMMUNITY)
Admission: RE | Admit: 2015-12-26 | Discharge: 2015-12-26 | Disposition: A | Discharge status: Home / Self Care | Payer: BLUE CROSS/BLUE SHIELD | Source: Ambulatory Visit | Attending: Radiology | Admitting: Radiology

## 2015-12-26 ENCOUNTER — Emergency Department
Admit: 2015-12-26 | Discharge: 2015-12-26 | Disposition: A | Discharge status: Home / Self Care | Payer: BLUE CROSS/BLUE SHIELD | Attending: Otolaryngology | Admitting: Otolaryngology

## 2015-12-26 DIAGNOSIS — E041 Nontoxic single thyroid nodule: Secondary | ICD-10-CM | POA: Insufficient documentation

## 2016-01-10 ENCOUNTER — Telehealth: Payer: Self-pay | Admitting: Internal Medicine

## 2016-01-10 NOTE — Telephone Encounter (Signed)
error 

## 2016-02-08 ENCOUNTER — Encounter: Payer: Self-pay | Admitting: Internal Medicine

## 2016-02-08 ENCOUNTER — Ambulatory Visit (INDEPENDENT_AMBULATORY_CARE_PROVIDER_SITE_OTHER): Payer: BLUE CROSS/BLUE SHIELD | Admitting: Internal Medicine

## 2016-02-08 VITALS — BP 126/80 | HR 88 | Ht 63.0 in | Wt 126.0 lb

## 2016-02-08 DIAGNOSIS — J45909 Unspecified asthma, uncomplicated: Secondary | ICD-10-CM

## 2016-02-08 DIAGNOSIS — G4733 Obstructive sleep apnea (adult) (pediatric): Secondary | ICD-10-CM | POA: Insufficient documentation

## 2016-02-08 DIAGNOSIS — Z9989 Dependence on other enabling machines and devices: Secondary | ICD-10-CM

## 2016-02-08 DIAGNOSIS — E038 Other specified hypothyroidism: Secondary | ICD-10-CM | POA: Diagnosis not present

## 2016-02-08 DIAGNOSIS — J383 Other diseases of vocal cords: Secondary | ICD-10-CM

## 2016-02-08 MED ORDER — DEXLANSOPRAZOLE 60 MG PO CPDR: 60.0000 mg | DELAYED_RELEASE_CAPSULE | Freq: Every day | ORAL | 11 refills | Status: DC

## 2016-02-08 MED ORDER — LEVOTHYROXINE SODIUM 100 MCG PO TABS
100.0000 ug | ORAL_TABLET | Freq: Every day | ORAL | 11 refills | Status: DC
Start: 2016-02-08 — End: 2017-03-08

## 2016-02-08 NOTE — Progress Notes (Signed)
Subjective:    Patient ID: Kayla [name], female    DOB: [birth year]   MRN: [id number]   Brief patient profile:  39 yowf never smoker with MS since Oct 96 all neuro care through Dr [name] in [facility] ([facility] of [facility]),   [name] at [facility] urology,  [facility]  Followed here for Primary care/hypothyroid     History of Present Illness  October 04, 2009 Followup with PFT's. Pt states that her breathing is no better since last seen June 2011 for acute visit. She states she is still wheezing- ventolin helps and is using this up to tid. w/u c/w anemia  Mild dysphagia no more than usual. occ waking up wheezing at night despite zegerid at hs.   rec  Try  use dulera 100 2 bid prn   September 18 2015  R THR [name]    10/01/2015  f/u ov/[name] re: sob s/p et on dulera 100 2bid and dexilant q am  Chief Complaint  Patient presents with  . Acute Visit    Pt c/o increased DOE since had hip replacement 09/18/15. She states she gets SOB with just talking, showering or walking short distances.   has cpap machine but not using at all  - prev eval and rx by Dr [name]  rec Add pepcid 20 mg x one at bedtime Prednisone 10 mg take  4 each am x 2 days,   2 each am x 2 days,  1 each am x 2 days and stop  Start back on cpap See Dr [name] asap   11/06/2015  f/u ov/[name] re:  cpx  Chief Complaint  Patient presents with  . Follow-up    cpx- pt states she is doing well today, no complaints.     breathing better at night on cpap per Dr [name]  Doe = wm shopping / was not limited at [facility] prior to hip surgery but has not resumed saba once a week / no cough  rec Prevnar 13 today Please remember to go to the lab  department downstairs for your tests - we will call you with the results when they are available. 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 Follow-up    Doing well and denies any co's today.   back in CPAP as of July 2017  > no change in symptoms but wakes her up a lot / f/u per Dr [name] planned       No obvious day to day or daytime variabilty in sob  or assoc excess/ purulent sputum or mucus plugs   or   cp or chest tightness, subjective wheeze or overt  hb symptoms. No unusual exp hx or h/o childhood pna/ asthma or knowledge of premature birth.  Sleeping ok without nocturnal  or early am exacerbation  of respiratory  c/o's or need for noct saba. Also denies any obvious fluctuation of symptoms with weather or environmental changes or other aggravating or alleviating factors except as outlined above   Current Medications, Allergies, Complete Past Medical History, Past Surgical History, Family History, and Social History were reviewed in [employer] record.    ROS  The following are not active complaints unless bolded sore throat, dysphagia, dental problems, itching, sneezing,  nasal congestion or excess/ purulent secretions, ear ache,   fever, chills, sweats, unintended wt loss, pleuritic or exertional cp, hemoptysis,  orthopnea pnd or leg swelling, presyncope, palpitations, heartburn, abdominal pain, anorexia, nausea, vomiting, diarrhea  or change in bowel or urinary habits, change in stools or urine, dysuria,hematuria,  rash, arthralgias, visual complaints, headache, numbness weakness or ataxia or problems with walking uses cane  or coordination,  change in mood/affect or memory.       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  [name]  Dyspnea/wheeze ? asthma  - HFA 50 % after coaching October 04, 2009 >  50% 06/25/2010  - PFT's October 04, 2009 FEV1 2.15 (95%) with ratio 69 and DLCO 58> corrects to 98, mild insp truncation  Health Maintenance.............................................Marland KitchenWert  - Pneumovax March 28, 2009 and Prevnar 13 11/06/2015  - Td  April 10, 2009  - CPX 11/06/2015        Objective:   Physical Exam  amb  white female nad / walks with cane / variably hoarse with voice fatigue   Wt  118 > 127 March 28, 2009 >129 June 17 > 128 October 04, 2009 > 129 06/25/2010 >125 10/29/2010 > 128 11/26/2010 >>127 12/10/2010 > 11/17/2011  129> 126 05/20/2012 > 07/29/2012  129 > 10/20/2012  129 >  135 06/21/2013 >  11/04/2013 135 >134 04/04/2014 > 04/18/2014 135 >  07/17/2014 132> 08/17/2014  135 > 12/20/2014 > 01/17/2015 134   > 08/08/2015   126 >  10/01/2015   130 > 11/06/2015   130    HEENT: nl dentition, turbinates, and orophanx. Nl external ear canals without cough reflex  Neck without JVD/Nodes/TM  Lungs clear with only transmitted noises from upper airway  RRR no s3 or murmur or increase in P2  Abd soft and benign with nl excursion in the supine position. No bruits or organomegaly / no cvat Ext warm hands, sym cool feet with nl pulses and  without calf tenderness, cyanosis clubbing - both feet with trace Pitting/ sym edema and slt  purplish discoloration Skin warm and dry without lesions Neuro:  nl sensorium, walks with cane, no obvious motor deficits / nl dtrs             Labs  reviewed:    Chemistry      Component Value Date/Time   NA 134 (L) 11/06/2015 0944   K 4.3 11/06/2015 0944   CL 96 11/06/2015 0944   CO2 30 11/06/2015 0944   BUN 18 11/06/2015 0944   CREATININE 0.72 11/06/2015 0944      Component Value Date/Time   CALCIUM 9.6 11/06/2015 0944   ALKPHOS 74 11/06/2015 0944   AST 26 11/06/2015 0944   ALT 15 11/06/2015 0944    BILITOT 0.3 11/06/2015 0944        Lab Results  Component Value Date   WBC 4.3 11/06/2015   HGB 12.3 11/06/2015   HCT 36.4 11/06/2015   MCV 91.6 11/06/2015   PLT 367.0 11/06/2015      Lab Results  Component Value Date   TSH 1.16 11/06/2015     Lab Results  Component Value Date   PROBNP 40.0 08/08/2015       Lab  Results  Component Value Date   CHOL 227 (H) 11/06/2015   HDL 95.20 11/06/2015   LDLCALC 122 (H) 11/06/2015   LDLDIRECT 116.1 06/25/2010   TRIG 48.0 11/06/2015   CHOLHDL 2 11/06/2015               Assessment & Plan:

## 2016-02-08 NOTE — Patient Instructions (Addendum)
See Dr [name] for all issues related to your cpap machine but it appears to be working fine by your download today   No change in medications and recs   Please schedule a follow up visit in 6  months but call sooner if needed

## 2016-02-10 ENCOUNTER — Encounter: Payer: Self-pay | Admitting: Internal Medicine

## 2016-02-10 NOTE — Assessment & Plan Note (Signed)
Download from 02/07/16  30/30 nights on 11 cm  AHI 3.8  F/u Melida Quitter with bilateral VC paralysis   Reviewed download with pt/ provided copy for DR [name] > all questions re to cpap/mask/equipment should be referred to his office unless/ until otherwise requested and can refer to sleep medicine prn

## 2016-02-10 NOTE — Assessment & Plan Note (Signed)
-  HFA 50 % after coaching October 04, 2009 >  Better on dulera 100 > hfa  50% 06/25/2010  - PFT's October 04, 2009 FEV1 2.15 (95%) with ratio 69 and DLCO 58> corrects to 98, mild insp truncation  - 07/17/2014 p extensive coaching HFA effectiveness =    75%   All goals of chronic asthma control met including optimal function and elimination of symptoms with minimal need for rescue therapy.  Contingencies discussed in full including contacting this office immediately if not controlling the symptoms using the rule of two's.

## 2016-02-10 NOTE — Assessment & Plan Note (Signed)
Lab Results  Component Value Date   TSH 1.16 11/06/2015     Adequate control on present rx, reviewed > no change in rx needed

## 2016-03-11 ENCOUNTER — Other Ambulatory Visit: Payer: Self-pay | Admitting: Internal Medicine

## 2016-05-09 ENCOUNTER — Telehealth: Payer: Self-pay | Admitting: Internal Medicine

## 2016-05-09 NOTE — Telephone Encounter (Signed)
Dr. Laverle Hobby office requested records and I faxed 25 pages to them on 05/05/2016

## 2016-05-16 ENCOUNTER — Other Ambulatory Visit: Payer: Self-pay | Admitting: Internal Medicine

## 2016-07-11 ENCOUNTER — Telehealth: Payer: Self-pay | Admitting: Internal Medicine

## 2016-07-11 DIAGNOSIS — G4733 Obstructive sleep apnea (adult) (pediatric): Secondary | ICD-10-CM

## 2016-07-11 DIAGNOSIS — Z9989 Dependence on other enabling machines and devices: Secondary | ICD-10-CM

## 2016-07-11 NOTE — Telephone Encounter (Signed)
Called and spoke with pt and she is aware of order placed for the split night study.  She will call back once this has been scheduled for an appt with sleep doctor.

## 2016-07-11 NOTE — Telephone Encounter (Signed)
Pt is requesting a referral for a new sleep study- pt is on cpap (cpap therapy started by another provider who is no longer practicing), and pt wants a sleep study to see if she still needs cpap.    MW ok to order?  Thanks.  02/08/16 AVS- Instructions  See Dr [name] for all issues related to your cpap machine but it appears to be working fine by your download today    No change in medications and recs    Please schedule a follow up visit in 6  months but call sooner if needed

## 2016-07-11 NOTE — Telephone Encounter (Signed)
Ok, do split night study and set up her for f/u by sleep medicine - she can always cancel the latter if the study is negative

## 2016-08-08 ENCOUNTER — Encounter: Payer: Self-pay | Admitting: Internal Medicine

## 2016-08-08 ENCOUNTER — Encounter: Payer: Self-pay | Admitting: Vascular Surgery

## 2016-08-08 ENCOUNTER — Ambulatory Visit (INDEPENDENT_AMBULATORY_CARE_PROVIDER_SITE_OTHER): Payer: BLUE CROSS/BLUE SHIELD | Admitting: Internal Medicine

## 2016-08-08 ENCOUNTER — Other Ambulatory Visit: Payer: Self-pay | Admitting: Vascular Surgery

## 2016-08-08 ENCOUNTER — Other Ambulatory Visit (INDEPENDENT_AMBULATORY_CARE_PROVIDER_SITE_OTHER): Payer: BLUE CROSS/BLUE SHIELD

## 2016-08-08 VITALS — BP 118/70 | HR 87 | Ht 62.75 in | Wt 121.0 lb

## 2016-08-08 DIAGNOSIS — M7989 Other specified soft tissue disorders: Secondary | ICD-10-CM | POA: Diagnosis not present

## 2016-08-08 DIAGNOSIS — R209 Unspecified disturbances of skin sensation: Secondary | ICD-10-CM

## 2016-08-08 DIAGNOSIS — G35 Multiple sclerosis: Secondary | ICD-10-CM | POA: Diagnosis not present

## 2016-08-08 DIAGNOSIS — E038 Other specified hypothyroidism: Secondary | ICD-10-CM

## 2016-08-08 DIAGNOSIS — R0989 Other specified symptoms and signs involving the circulatory and respiratory systems: Secondary | ICD-10-CM

## 2016-08-08 LAB — BASIC METABOLIC PANEL
BUN: 15 mg/dL (ref 6–23)
CO2: 29 mEq/L (ref 19–32)
Calcium: 9.9 mg/dL (ref 8.4–10.5)
Chloride: 93 mEq/L — ABNORMAL LOW (ref 96–112)
Creatinine, Ser: 0.77 mg/dL (ref 0.40–1.20)
GFR: 80.2 mL/min (ref >60.00)
Glucose, Bld: 91 mg/dL (ref 70–99)
Potassium: 4.7 mEq/L (ref 3.5–5.1)
Sodium: 130 mEq/L — ABNORMAL LOW (ref 135–145)

## 2016-08-08 LAB — TSH: TSH: 1.59 u[IU]/mL (ref 0.35–4.50)

## 2016-08-08 MED ORDER — FUROSEMIDE 20 MG PO TABS: 20.0000 mg | ORAL_TABLET | Freq: Every day | ORAL | 11 refills | Status: DC

## 2016-08-08 NOTE — Progress Notes (Signed)
Subjective:    Patient ID: Kayla [name], female    DOB: 30-Jan-1951   MRN: [id number]   Brief patient profile:  75 yowf never smoker with MS since Oct 96 all neuro care through Kayla [name] in [name]),   Kayla [name] at [facility] urology,  [place] [facility]  Followed here for Primary care/hypothyroid     History of Present Illness  October 04, 2009 Followup with PFT's. Pt states that her breathing is no better since last seen June 2011 for acute visit. She states she is still wheezing- ventolin helps and is using this up to tid. w/u c/w anemia  Mild dysphagia no more than usual. occ waking up wheezing at night despite zegerid at hs.   rec  Try  use dulera 100 2 bid prn   September 18 2015  R THR Kayla [name]    10/01/2015  f/u ov/Kayla [name] re: sob s/p et on dulera 100 2bid and dexilant q am  Chief Complaint  Patient presents with  . Acute Visit    Pt c/o increased DOE since had hip replacement 09/18/15. She states she gets SOB with just talking, showering or walking short distances.   has cpap machine but not using at all  - prev eval and rx by Kayla [name]  rec Add pepcid 20 mg x one at bedtime Prednisone 10 mg take  4 each am x 2 days,   2 each am x 2 days,  1 each am x 2 days and stop  Start back on cpap See Kayla [name] asap   11/06/2015  f/u ov/Kayla [name] re:  cpx  Chief Complaint  Patient presents with  . Follow-up    cpx- pt states she is doing well today, no complaints.     breathing better at night on cpap per Kayla [name]  Doe = wm shopping / was not limited at [facility] prior to hip surgery but has not resumed saba once a week / no cough  rec Prevnar 13 today Please remember to go to the lab  department downstairs for your tests - we will call you with the results when they are available. 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 Follow-up    Doing well and denies any co's today.   back in CPAP as of July 2017  > no change in symptoms but wakes her up a lot / f/u per Kayla [name] planned  rec See Kayla [name] for all issues related to your cpap machine but it appears to be working fine by your download today  No change in medications and recs     08/08/2016  f/u ov/[name] re: multiple concerns 1) check thyroid - clinically no symptoms 2) feet stay cold/ blue with neg vascular w/u to date 3) wants referral to sports medicine as needs stay hyperextended due to MS/ no problem with falling just using cane  Not limited by breathing from desired activities    No obvious day to day or daytime variability or assoc excess/ purulent sputum or mucus plugs or hemoptysis or cp or chest tightness, subjective wheeze or overt sinus or hb symptoms. No unusual exp hx or h/o childhood pna/ asthma or knowledge of premature birth.  Sleeping ok without nocturnal  or early am exacerbation  of respiratory  c/o's or need for noct saba. Also denies any obvious fluctuation of symptoms with weather or environmental changes or other aggravating or alleviating factors except as outlined above   Current Medications, Allergies, Complete Past Medical History, Past Surgical History, Family History, and Social History were reviewed in [employer] record.  ROS  The following are not active complaints unless bolded sore throat, dysphagia, dental problems, itching, sneezing,  nasal congestion or excess/ purulent secretions, ear ache,   fever, chills, sweats, unintended wt loss, classically pleuritic or exertional cp,  orthopnea pnd or leg swelling resolved on lasix , presyncope, palpitations, abdominal pain, anorexia, nausea, vomiting, diarrhea  or change in bowel or bladder habits, change in stools or urine, dysuria,hematuria,  rash, arthralgias, visual complaints, headache, numbness, weakness or ataxia or  problems with walking or coordination,  change in mood/affect or memory.             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  Redmond [name]  Dyspnea/wheeze ? asthma  - HFA 50 % after coaching October 04, 2009 >  50% 06/25/2010  - PFT's October 04, 2009 FEV1 2.15 (95%) with ratio 69 and DLCO 58> corrects to 98, mild insp truncation  Health Maintenance.............................................Marland KitchenWert  - Pneumovax March 28, 2009 and Prevnar 13 11/06/2015  - Td  April 10, 2009  - CPX 11/06/2015        Objective:   Physical Exam  amb  white female nad / walks with cane / variably hoarse with voice fatigue   Vital signs removed - Note on arrival 02 sats  96% on RA    Wt  118 > 127 March 28, 2009 >129 June 17 > 128 October 04, 2009 > 129 06/25/2010 >125 10/29/2010 > 128 11/26/2010 >>127 12/10/2010 > 11/17/2011  129> 126 05/20/2012 > 07/29/2012  129 > 10/20/2012  129 >  135 06/21/2013 >  11/04/2013 135 >134 04/04/2014 > 04/18/2014 135 >  07/17/2014 132> 08/17/2014  135 > 12/20/2014 > 01/17/2015 134   > 08/08/2015   126 >  10/01/2015   130 > 11/06/2015   130    HEENT: nl dentition, turbinates, and orophanx. Nl external ear canals without cough reflex  Neck without JVD/Nodes/TM  Lungs clear with   transmitted noises from upper airway - otherwise appears clear   RRR no s3 or murmur or increase in P2  Abd soft and benign with nl excursion in the supine position. No bruits or organomegaly / no cvat Ext warm hands, sym cool feet with nl pulses and  without calf tenderness, cyanosis clubbing -  sym edema and slt  purplish  discoloration Skin warm and dry without lesions Neuro:  nl sensorium, walks with cane, no obvious motor deficits / does walk with hyperextension of both knees  C/w quads weak symmetrically               Labs ordered/ reviewed:      Chemistry      Component Value Date/Time   NA 130 (L) 08/08/2016 1148   K 4.7 08/08/2016 1148   CL 93 (L) 08/08/2016 1148   CO2 29 08/08/2016 1148   BUN 15 08/08/2016 1148  CREATININE 0.77 08/08/2016 1148      Component Value Date/Time   CALCIUM 9.9 08/08/2016 1148   ALKPHOS 66 11/10/2015 1448   AST 49 (H) 11/10/2015 1448   ALT 12 (L) 11/10/2015 1448   BILITOT 1.0 11/10/2015 1448            Lab Results  Component Value Date   TSH 1.59 08/08/2016                        Assessment & Plan:

## 2016-08-08 NOTE — Patient Instructions (Addendum)
Please see patient coordinator before you leave today  to schedule referal to sports medicine and vascular surgery  Please remember to go to the lab department downstairs in the basement  for your tests - we will call you with the results when they are available.  Please schedule a follow up visit in 3 months but call sooner if needed      =

## 2016-08-08 NOTE — Progress Notes (Signed)
Spoke with pt and notified of results per Dr. [name]. Pt verbalized understanding and denied any questions. 

## 2016-08-10 NOTE — Assessment & Plan Note (Signed)
-   Arterial dopplers 08/03/12 > nl bilaterally with nl abi's also - referred to vascular surgery 08/08/2016

## 2016-08-10 NOTE — Assessment & Plan Note (Signed)
Adequate control on present rx, reviewed in detail with pt > no change in rx needed   

## 2016-08-10 NOTE — Assessment & Plan Note (Signed)
-   try off neurontin 08/17/14 > did not do > rec that she discuss this with her neurologist 12/20/2014  - art/venous dopplers rec 08/10/2015 > nl arterial flows  Still on gabapentin ? Contributing  But well controlled on low dose lasix

## 2016-08-10 NOTE — Assessment & Plan Note (Signed)
Followed by  Dr Effie Shy [facility], was at [facility]) - referred to sports medicine 08/08/2016 for hyperext knees when walking

## 2016-08-11 ENCOUNTER — Ambulatory Visit (HOSPITAL_COMMUNITY)
Admission: RE | Admit: 2016-08-11 | Discharge: 2016-08-11 | Disposition: A | Discharge status: Home / Self Care | Payer: BLUE CROSS/BLUE SHIELD | Source: Ambulatory Visit | Attending: Vascular Surgery | Admitting: Vascular Surgery

## 2016-08-11 DIAGNOSIS — R0989 Other specified symptoms and signs involving the circulatory and respiratory systems: Secondary | ICD-10-CM

## 2016-08-11 DIAGNOSIS — R9439 Abnormal result of other cardiovascular function study: Secondary | ICD-10-CM | POA: Insufficient documentation

## 2016-08-15 ENCOUNTER — Encounter: Payer: Self-pay | Admitting: Vascular Surgery

## 2016-08-15 ENCOUNTER — Ambulatory Visit (INDEPENDENT_AMBULATORY_CARE_PROVIDER_SITE_OTHER): Payer: BLUE CROSS/BLUE SHIELD | Admitting: Vascular Surgery

## 2016-08-15 VITALS — BP 112/68 | HR 83 | Temp 98.5°F | Resp 16 | Ht 62.75 in | Wt 123.0 lb

## 2016-08-15 DIAGNOSIS — I75023 Atheroembolism of bilateral lower extremities: Secondary | ICD-10-CM | POA: Diagnosis not present

## 2016-08-15 NOTE — Progress Notes (Signed)
Patient ID: Kayla [name], female   DOB: 08-03-1950, 66 y.o.   MRN: [id number]  Reason for Consult: New Evaluation (Epic referral, Dr. Melvyn Novas.  Bilat cold, purple feet. )   Referred by Tanda Rockers, MD  Subjective:     HPI:  Kayla [name] is a 66 y.o. female with a 3 year history of bilateral foot discoloration that is worse when she is walking. She does not have pain she does state that they occasionally feel cool. He has not had tissue loss or ulceration although she did lose her left great toenail and it is healing now. He does not have a history of a DVT she does not have history of arterial disease. She does have history of COPD as well as MLS and occasional swelling of her bilateral ankles. She is a lifelong nonsmoker does occasionally drink alcohol. She has no complaints related to today's visit.  Past Medical History:  Diagnosis Date  . Arthritis    oa  . Cervical cancer ([place]) 2005  . COPD (chronic obstructive pulmonary disease) ([place])   . Multiple sclerosis ([place])   . Osteoporosis   . Other diseases of vocal cords    vocal cords are not even  . Other voice and resonance disorders   . Pneumonia 2014, 1992  . Sleep apnea    no cpap used  . Unspecified hypothyroidism    Family History  Problem Relation Age of Onset  . Colon cancer Father    Past Surgical History:  Procedure Laterality Date  . ABDOMINAL HYSTERECTOMY  2005   for cervical cancer, complete  . CONVERSION TO TOTAL HIP Right 09/18/2015   Procedure: CONVERSION OF RIGHT HEMI ARTHROPLASTY TO TOTAL HIP ARTHROPLASTY ACETABULAR REVISON ;  Surgeon: Paralee Cancel, MD;  Location: [facility];  Service: Orthopedics;  Laterality: Right;  . EYE SURGERY Bilateral    lens for cataracts  . HIP ARTHROPLASTY Right 03/01/2013   Procedure: ARTHROPLASTY BIPOLAR HIP;  Surgeon: Mauri Pole, MD;  Location: [facility];  Service: Orthopedics;  Laterality: Right;  . HIP CLOSED REDUCTION Right 03/12/2013   Procedure: CLOSED REDUCTION HIP;   Surgeon: Gearlean Alf, MD;  Location: [facility];  Service: Orthopedics;  Laterality: Right;  . HIP CLOSED REDUCTION Right 03/19/2013   Procedure: CLOSED REDUCTION HIP;  Surgeon: Johnn Hai, MD;  Location: [facility];  Service: Orthopedics;  Laterality: Right;  . HIP CLOSED REDUCTION Right 11/10/2015   Procedure: CLOSED MANIPULATION HIP;  Surgeon: Rod Can, MD;  Location: [facility];  Service: Orthopedics;  Laterality: Right;  Marland Kitchen VESICOVAGINAL FISTULA CLOSURE W/ TAH  2005    Short Social History:  Social History  Substance Use Topics  . Smoking status: Never Smoker  . Smokeless tobacco: Never Used  . Alcohol use Yes     Comment: rare    Allergies  Allergen Reactions  . Iohexol Hives  . Methylprednisolone Other (See Comments)    Reaction:  Decreases pts heart rate   . Morphine And Related Other (See Comments)    Reaction:  Hallucinations   . Oxycodone-Acetaminophen Rash and Other (See Comments)    Reaction:  Hallucinations   . Phenytoin Sodium Extended Rash  . Zanaflex [Tizanidine Hydrochloride] Rash    Current Outpatient Prescriptions  Medication Sig Dispense Refill  . acidophilus (RISAQUAD) CAPS capsule Take 1 capsule by mouth daily.    Marland Kitchen amphetamine-dextroamphetamine (ADDERALL) 20 MG tablet Take 20 mg by mouth 2 (two) times daily.     Marland Kitchen  baclofen (LIORESAL) 20 MG tablet Take 20 mg by mouth 3 (three) times daily.     . Calcium Carbonate-Vitamin D (CALCIUM 600+D) 600-400 MG-UNIT tablet Take 1 tablet by mouth daily.    . cholecalciferol (VITAMIN D) 1000 UNITS tablet Take 2,000 Units by mouth daily.     Marland Kitchen dexlansoprazole (DEXILANT) 60 MG capsule Take 1 capsule (60 mg total) by mouth daily before breakfast. 30 capsule 11  . DULERA 100-5 MCG/ACT AERO INHALE 2 PUFFS IN THE AM AND 2 PUFFS IN THE PM ABOUT 12 HOURS APART. 13 g 3  . famotidine (PEPCID) 20 MG tablet Take 20 mg by mouth at bedtime.    Marland Kitchen FLUoxetine (PROZAC) 40 MG capsule Take 40 mg by mouth daily.      . furosemide (LASIX)  20 MG tablet Take 1 tablet (20 mg total) by mouth daily. 30 tablet 11  . gabapentin (NEURONTIN) 600 MG tablet Take 600 mg by mouth 2 (two) times daily.    Marland Kitchen levothyroxine (SYNTHROID, LEVOTHROID) 100 MCG tablet Take 1 tablet (100 mcg total) by mouth daily before breakfast. 30 tablet 11  . Multiple Vitamin (MULTIVITAMIN WITH MINERALS) TABS tablet Take 1 tablet by mouth daily.    Marland Kitchen ocrelizumab (OCREVUS) 300 MG/10ML injection Inject 300 mg into the vein every 6 (six) months.     . temazepam (RESTORIL) 15 MG capsule Take 15 mg by mouth at bedtime as needed for sleep.    . VENTOLIN HFA 108 (90 Base) MCG/ACT inhaler INHALE 2 PUFFS INTO LUNGS EVERY 6 HOURS AS NEEDED. 18 g 2   No current facility-administered medications for this visit.     Review of Systems  Constitutional:  Constitutional negative. HENT: HENT negative.  Eyes: Eyes negative.  Respiratory: Positive for shortness of breath.  Cardiovascular: Positive for dyspnea with exertion and leg swelling.  Musculoskeletal: Musculoskeletal negative.  Skin: Skin negative.  Neurological: Positive for focal weakness and numbness.  Hematologic: Hematologic/lymphatic negative.  Psychiatric: Psychiatric negative.        Objective:  Objective   Vitals:   08/15/16 1535  BP: 112/68  Pulse: 83  Resp: 16  Temp: 98.5 F (36.9 C)  TempSrc: Oral  SpO2: 100%  Weight: 123 lb (55.8 kg)  Height: 5' 2.75" (1.594 m)   Body mass index is 21.96 kg/m.  Physical Exam  Constitutional: She is oriented to person, place, and time. She appears well-developed.  HENT:  Head: Normocephalic.  Neck: Normal range of motion.  Cardiovascular:  Pulses:      Radial pulses are 2+ on the right side, and 2+ on the left side.       Popliteal pulses are 2+ on the right side, and 2+ on the left side.       Dorsalis pedis pulses are 1+ on the right side, and 1+ on the left side.       Posterior tibial pulses are 1+ on the right side, and 1+ on the left side.    Abdominal: Soft.  Musculoskeletal: Normal range of motion. She exhibits no edema.  Neurological: She is alert and oriented to person, place, and time.  Skin: Skin is warm and dry.  Her bilateral fee have purple discoloration, brisk capillary refill, color improves with elevating her legs.   Psychiatric: She has a normal mood and affect. Her behavior is normal. Judgment and thought content normal.    Data: I have reviewed her bilateral lower extremity studies demonstrate ABIs greater than 1 bilaterally.  Assessment/Plan:    65 year old female with history of bilateral foot discoloration that does not cause her much concern. Doesn't improve with elevating her legs and appears to be from venous pooling resident arterial issue. Given that she is asymptomatic from this I recommended her to have no further testing or intervention at this time. Should she have further issues she can follow-up on a when necessary basis. All of her questions were answered and she was satisfied with this plan.     Waynetta Sandy MD [facility]

## 2016-09-03 ENCOUNTER — Ambulatory Visit (HOSPITAL_BASED_OUTPATIENT_CLINIC_OR_DEPARTMENT_OTHER): Payer: BLUE CROSS/BLUE SHIELD | Attending: Internal Medicine | Admitting: Pulmonary Disease

## 2016-09-03 DIAGNOSIS — G4733 Obstructive sleep apnea (adult) (pediatric): Secondary | ICD-10-CM | POA: Insufficient documentation

## 2016-09-03 DIAGNOSIS — Z9989 Dependence on other enabling machines and devices: Secondary | ICD-10-CM

## 2016-09-03 DIAGNOSIS — G4761 Periodic limb movement disorder: Secondary | ICD-10-CM

## 2016-09-03 DIAGNOSIS — G4739 Other sleep apnea: Secondary | ICD-10-CM

## 2016-09-05 ENCOUNTER — Encounter: Payer: Self-pay | Admitting: Family Medicine

## 2016-09-05 ENCOUNTER — Ambulatory Visit (INDEPENDENT_AMBULATORY_CARE_PROVIDER_SITE_OTHER): Payer: BLUE CROSS/BLUE SHIELD | Admitting: Family Medicine

## 2016-09-05 VITALS — BP 138/82 | HR 84 | Ht 62.75 in | Wt 125.0 lb

## 2016-09-05 DIAGNOSIS — Z96649 Presence of unspecified artificial hip joint: Secondary | ICD-10-CM

## 2016-09-05 DIAGNOSIS — M25551 Pain in right hip: Secondary | ICD-10-CM | POA: Diagnosis not present

## 2016-09-05 NOTE — Progress Notes (Signed)
[name] [address], [place] [address] Phone: [phone] Subjective:     CC: Right knee instability  FBP:[id number]  Kayla [name] is a 66 y.o. female coming in with complaint of knee instability. Past medical history significant for multiple sclerosis. Patient has had a partial hip replacements back in 2015 as well as a revision of a total hip back in June 2017. Unfortunate patient is also had hip dislocation on 2 other times in the emergency department last one in August 2017. Patient states that unfortunately now she walks and she notices that she has internal rotation of the right leg as well as it seems to be extending the right knee. Denies any true pain. Feels though that she is unstable. Patient has seen other providers for this but has not been able to have any significant treatment that helps. Patient continues to work with a physical therapist 2 times a week for the last year and has not made any significant progress at the moment. Patient is attempting to stay active with her multiple sclerosis knows that there is important. Denies anything such as swelling, numbness, or any associated back pain.      Past Medical History:  Diagnosis Date  . Arthritis    oa  . Cervical cancer ([place]) 2005  . COPD (chronic obstructive pulmonary disease) ([place])   . Multiple sclerosis ([place])   . Osteoporosis   . Other diseases of vocal cords    vocal cords are not even  . Other voice and resonance disorders   . Pneumonia 2014, 1992  . Sleep apnea    no cpap used  . Unspecified hypothyroidism    Past Surgical History:  Procedure Laterality Date  . ABDOMINAL HYSTERECTOMY  2005   for cervical cancer, complete  . CONVERSION TO TOTAL HIP Right 09/18/2015   Procedure: CONVERSION OF RIGHT HEMI ARTHROPLASTY TO TOTAL HIP ARTHROPLASTY ACETABULAR REVISON ;  Surgeon: Paralee Cancel, MD;  Location: [facility];  Service: Orthopedics;  Laterality: Right;  . EYE SURGERY  Bilateral    lens for cataracts  . HIP ARTHROPLASTY Right 03/01/2013   Procedure: ARTHROPLASTY BIPOLAR HIP;  Surgeon: Mauri Pole, MD;  Location: [facility];  Service: Orthopedics;  Laterality: Right;  . HIP CLOSED REDUCTION Right 03/12/2013   Procedure: CLOSED REDUCTION HIP;  Surgeon: Gearlean Alf, MD;  Location: [facility];  Service: Orthopedics;  Laterality: Right;  . HIP CLOSED REDUCTION Right 03/19/2013   Procedure: CLOSED REDUCTION HIP;  Surgeon: Johnn Hai, MD;  Location: [facility];  Service: Orthopedics;  Laterality: Right;  . HIP CLOSED REDUCTION Right 11/10/2015   Procedure: CLOSED MANIPULATION HIP;  Surgeon: Rod Can, MD;  Location: [facility];  Service: Orthopedics;  Laterality: Right;  Marland Kitchen VESICOVAGINAL FISTULA CLOSURE W/ TAH  2005   Social History   Social History  . Marital status: Married    Spouse name: N/A  . Number of children: N/A  . Years of education: N/A   Occupational History  . disabled [place] History Main Topics  . Smoking status: Never Smoker  . Smokeless tobacco: Never Used  . Alcohol use Yes     Comment: rare  . Drug use: No  . Sexual activity: Not Asked   Other Topics Concern  . None   Social History Narrative  . None   Allergies  Allergen Reactions  . Iohexol Hives  . Methylprednisolone Other (See Comments)  Reaction:  Decreases pts heart rate   . Morphine And Related Other (See Comments)    Reaction:  Hallucinations   . Oxycodone-Acetaminophen Rash and Other (See Comments)    Reaction:  Hallucinations   . Phenytoin Sodium Extended Rash  . Zanaflex [Tizanidine Hydrochloride] Rash   Family History  Problem Relation Age of Onset  . Colon cancer Father     Past medical history, social, surgical and family history all reviewed in electronic medical record.  No pertanent information unless stated regarding to the chief complaint.   Review of Systems:Review of systems updated and as accurate as of 09/05/16  No  headache, visual changes, nausea, vomiting, diarrhea, constipation, dizziness, abdominal pain, skin rash, fevers, chills, night sweats, weight loss, swollen lymph nodes, body aches, joint swelling, chest pain, shortness of breath,  Positive muscle aches, body aches, feelings of frustration  Objective  Blood pressure 138/82, pulse 84, height 5' 2.75" (1.594 m), weight 125 lb (56.7 kg). Systems examined below as of 09/05/16   General: No apparent distress alert and oriented x3 mood and affect normal, dressed appropriately.  HEENT: Pupils equal, extraocular movements intact  Respiratory: Patient's speak in full sentences and does not appear short of breath  Cardiovascular: No lower extremity edema, non tender, no erythema  Skin: Warm dry intact with no signs of infection or rash on extremities or on axial skeleton.  Abdomen: Soft nontender  Neuro: Cranial nerves II through XII are intact, neurovascularly intact in all extremities with 2+ DTRs and 2+ pulses.  Lymph: No lymphadenopathy of posterior or anterior cervical chain or axillae bilaterally.  Gait Severe antalgic gait with patient having internal rotation of the right leg and significant hyperextension of the knees bilaterally with walking. MSK:  Non tender with mild instability multiple joints and symmetric strength and tone of shoulders, elbows, wrist,  and ankles bilaterally.  When testing patient's right knee patient does have a mallet internal rotation with extension of the knee. Patient cannot externally rotate hip at all at this point. Patient does have some mild pain in the groin area with testing of the knee. Neurovascularly intact distally. Good capillary refill distally. Nontender around the knee and no swelling noted. No crepitus of the knee. Patient does feel that she has a significant instability of the knee.   Impression and Recommendations:     This case required medical decision making of moderate complexity.      Note:  This dictation was prepared with Dragon dictation along with smaller phrase technology. Any transcriptional errors that result from this process are unintentional.

## 2016-09-05 NOTE — Patient Instructions (Signed)
Good to see you  I am sorry  For the bad news Dr. Mayer Camel at [facility] would be my go to and I will get you in with him In the meantime Vitamin D 5000 IU daily  Turmeric 500mg  2 times daily but watch for reflux Tart cherry extract at night I am here if you have questions.

## 2016-09-05 NOTE — Assessment & Plan Note (Signed)
Patient has had a revision of the right hip. Patient now has also had 2 different episodes of dislocation. I do believe that some of patient's muscle weakness second due to her multiple sclerosis is likely contributing but at this moment patient is unstable. Even though patient has significant extension of the knees my feeling is the hip is unstable. We discussed the possibility of a custom brace that we can lock out such as a playmaker of the knee. I would be our best possibility but I would rather be evaluated by orthopedic surgery for further evaluation of patient's hip joint. Patient is in agreement with this and will be referred for another opinion. We discussed over-the-counter medications a could be beneficial to help with some of her other chronic conditions. Follow-up with me as needed.

## 2016-09-10 ENCOUNTER — Telehealth: Payer: Self-pay | Admitting: *Deleted

## 2016-09-10 DIAGNOSIS — G4739 Other sleep apnea: Secondary | ICD-10-CM

## 2016-09-10 DIAGNOSIS — G4761 Periodic limb movement disorder: Secondary | ICD-10-CM

## 2016-09-10 NOTE — Telephone Encounter (Signed)
Spoke with pt and notified of results per Dr. Melvyn Novas. Pt verbalized understanding and denied any questions.  Consult with VS for 12/24/16

## 2016-09-10 NOTE — Telephone Encounter (Signed)
-----   Message from Tanda Rockers, MD sent at 09/10/2016  1:32 PM EDT ----- Study did not show significant sleep apnea when not on back so key is to stay off back and f/u with sleeep specialist > refer to [facility] if she doesn't have one (also has restless legs which he treats but I don't - though her neurologist probably could/ would and happy to send him a copy of study to weigh in )

## 2016-09-10 NOTE — Procedures (Signed)
   Patient Name: Kayla [name], Kayla [name] Date: 09/03/2016 Gender: Female D.O.B: April 24, 1950 Age (years): 66 Referring Provider: Tanda Rockers Height (inches): 43 Interpreting Physician: Chesley Mires MD, ABSM Weight (lbs): 125 RPSGT: Laren Everts BMI: 22 MRN: [id number] Neck Size: 14.00  CLINICAL INFORMATION Sleep Study Type: NPSG  Indication for sleep study: Fatigue, OSA, Snoring, Witnessed Apneas  Epworth Sleepiness Score: 8  SLEEP STUDY TECHNIQUE As per the AASM Manual for the Scoring of Sleep and Associated Events v2.3 (April 2016) with a hypopnea requiring 4% desaturations.  The channels recorded and monitored were frontal, central and occipital EEG, electrooculogram (EOG), submentalis EMG (chin), nasal and oral airflow, thoracic and abdominal wall motion, anterior tibialis EMG, snore microphone, electrocardiogram, and pulse oximetry.  MEDICATIONS Medications self-administered by patient taken the night of the study : N/A  SLEEP ARCHITECTURE The study was initiated at 10:56:52 PM and ended at 5:04:43 AM.  Sleep onset time was 23.4 minutes and the sleep efficiency was 73.8%. The total sleep time was 271.5 minutes.  Stage REM latency was N/A minutes.  The patient spent 11.05% of the night in stage N1 sleep, 88.95% in stage N2 sleep, 0.00% in stage N3 and 0.00% in REM.  Alpha intrusion was absent.  Supine sleep was 18.05%.  RESPIRATORY PARAMETERS The overall apnea hypopnea index (AHI) was 2.9 per hour. There were 12 total apneas, including 6 obstructive, 6 central and 0 mixed apneas. There were 1 hypopneas and 17 RERAs.  The AHI during Stage REM sleep was N/A per hour.  AHI while supine was 11.0 per hour.  The mean oxygen saturation was 96.51%. The minimum SpO2 during sleep was 85.00%.  Soft snoring was noted during this study.  CARDIAC DATA The 2 lead EKG demonstrated sinus rhythm. The mean heart rate was 60.85 beats per minute. Other EKG findings include:  None.  LEG MOVEMENT DATA The total PLMS were 159 with a resulting PLMS index of 35.14. Associated arousal with leg movement index was 6.0 .  IMPRESSIONS - While she had a few obstructive respiratory events, these were not frequent enough to qualify for a diagnosis of obstructive sleep apnea.  The overall AHI was 2.9 and SpO2 low was 85%.  The majority of her events happened while asleep in the supine position. - Moderate periodic limb movements of sleep occurred during the study. Associated arousals were significant.  DIAGNOSIS - Positional sleep apnea (G47.39) - Periodic limb movements of sleep (G47.61)  RECOMMENDATIONS - Positional therapy for supine related sleep apnea.  If this improves her sleep, then she might be able to forego the use of CPAP. - She should be assessed for the presence of restless leg syndrome  [Electronically signed] 09/10/2016 11:43 AM  Chesley Mires MD, ABSM Diplomate, American Board of Sleep Medicine   NPI: [id number]

## 2016-09-10 NOTE — Progress Notes (Signed)
Revewed/ referred to Dr Halford Chessman / neuro re ? Restless legs rx

## 2016-11-06 ENCOUNTER — Ambulatory Visit (INDEPENDENT_AMBULATORY_CARE_PROVIDER_SITE_OTHER): Payer: BLUE CROSS/BLUE SHIELD | Admitting: Sports Medicine

## 2016-11-06 VITALS — BP 144/63 | Ht 63.0 in | Wt 125.0 lb

## 2016-11-06 DIAGNOSIS — R29898 Other symptoms and signs involving the musculoskeletal system: Secondary | ICD-10-CM | POA: Diagnosis not present

## 2016-11-06 NOTE — Progress Notes (Signed)
   Subjective:    Patient ID: Kayla [name], female    DOB: 01-22-1951, 66 y.o.   MRN: [id number]  HPI Patient is a very pleasant 66 yo female with hx of MS x 22 years, partial RT hip replacement in 2015 2/2 fall followed by total RT hip replacement in 2017 presenting with concern of causing knee damage as a result of bilateral knee hyperextension with walking.  Patient denies knee pain and swelling.  She reports feeling unstable with walking for years, this is not new.  She reports bilateral knee hyperextension with walking for years, unsure if before or after diagnosis of MS.  She began walking with cane after hip surgeries.  She currently works with PT in home gym on core and balance for past year, does curves MWF, and therapeutic Yoga on Thursdays.  She does no specific leg strengthening exercises.  No previous knee injuries or imaging studies.  Review of Systems  Musculoskeletal: Positive for gait problem. Negative for arthralgias and joint swelling.  Neurological: Negative for numbness.      Objective:   Physical Exam  Constitutional: She is oriented to person, place, and time. She appears well-developed and well-nourished. No distress.  HENT:  Head: Normocephalic and atraumatic.  Eyes: Conjunctivae are normal.  Pulmonary/Chest: Effort normal.  Appears to get winded easily  Abdominal: She exhibits no distension.  Musculoskeletal:       Right knee: Normal. She exhibits normal range of motion, no swelling, no ecchymosis, no deformity, no erythema, no LCL laxity, no bony tenderness and no MCL laxity. No tenderness found. No medial joint line, no lateral joint line, no MCL, no LCL and no patellar tendon tenderness noted.       Left knee: Normal. She exhibits normal range of motion, no swelling, no ecchymosis, no deformity, no erythema, no LCL laxity, no bony tenderness and no MCL laxity. No tenderness found. No medial joint line, no lateral joint line, no MCL, no LCL and no patellar tendon  tenderness noted.  Hip, knee, and ankle muscles 4/5 strength.  Neurovascularly intact.  Right foot with internal rotation. Neurogenic gait with cane with hyperextension noted.  Neurological: She is alert and oriented to person, place, and time.  Skin: Skin is warm and dry.  Psychiatric: She has a normal mood and affect. Her behavior is normal. Judgment and thought content normal.      Assessment & Plan:  Lower extremity weakness Abnormal gait with hyperextension Given no knee pain or knee swelling, hyperextension with walking does not appear to be causing problems at this time.  No indication for imaging at this time given lack of pain and swelling. - Educated on isometric quad strengthening exercise (simple straight leg raise) and instructed to work up to 3 sets of 30. - Given bilateral knee sleeves.  Do not feel larger brace would be beneficial, but alternatively may hinder patient and decrease strength long term. - Will have follow up in 3-4 weeks to reassess strength and see if the compression sleeves have helped.

## 2016-11-07 ENCOUNTER — Encounter (HOSPITAL_COMMUNITY): Payer: Self-pay | Admitting: Emergency Medicine

## 2016-11-07 ENCOUNTER — Emergency Department (HOSPITAL_COMMUNITY)
Admission: EM | Admit: 2016-11-07 | Discharge: 2016-11-08 | Disposition: A | Discharge status: Home / Self Care | Payer: BLUE CROSS/BLUE SHIELD | Attending: Emergency Medicine | Admitting: Emergency Medicine

## 2016-11-07 DIAGNOSIS — J449 Chronic obstructive pulmonary disease, unspecified: Secondary | ICD-10-CM | POA: Diagnosis not present

## 2016-11-07 DIAGNOSIS — Z8541 Personal history of malignant neoplasm of cervix uteri: Secondary | ICD-10-CM | POA: Insufficient documentation

## 2016-11-07 DIAGNOSIS — E871 Hypo-osmolality and hyponatremia: Secondary | ICD-10-CM | POA: Diagnosis not present

## 2016-11-07 DIAGNOSIS — Z79899 Other long term (current) drug therapy: Secondary | ICD-10-CM | POA: Insufficient documentation

## 2016-11-07 DIAGNOSIS — R339 Retention of urine, unspecified: Secondary | ICD-10-CM | POA: Diagnosis present

## 2016-11-07 NOTE — ED Triage Notes (Signed)
Pt comes after having a catheter placed last Tuesday after being diagnosed with a UTI.  Went to [facility] today and an ultrasound showed that one of her kidneys are "full".  States she has had a decrease in urine output and darkened urine since her appointment.  Pt has amber colored urine present in leg bag.  Pt ambulatory and A&O x4. Patient denies pain at this time.

## 2016-11-07 NOTE — ED Notes (Signed)
Bladder scan performed at this time revealing 13cc in the bladder. Leg bag of Foley emptied with an amount of 475. Pt states that she has had 120oz of water since noon today. Urine is dark yellow, not malodorous at this time.

## 2016-11-07 NOTE — ED Provider Notes (Signed)
[facility] DEPT Provider Note   CSN: [id number] Arrival date & time: 11/07/16  1857     History   Chief Complaint Chief Complaint  Patient presents with  . Foley Catheter Problem    HPI Kayla [name] is a 66 y.o. female.  HPI Pt comes in with cc of urinary retention. Pt had a foley catheter placed 3 days ago. She was seen by Urologist this afternoon and was informed that her US showed some "fluid around the kidney" and advised to return on Tuesday for CT. Pt reports that since the visit in the clinic she has not had more than 50 cc of output, and so she got concerned and came to the ER. Pt denies nausea, emesis, fevers, chills, chest pains, shortness of breath, headaches, abdominal pain, back pain.   Past Medical History:  Diagnosis Date  . Arthritis    oa  . Cervical cancer ([place]) 2005  . COPD (chronic obstructive pulmonary disease) ([place])   . Multiple sclerosis ([place])   . Osteoporosis   . Other diseases of vocal cords    vocal cords are not even  . Other voice and resonance disorders   . Pneumonia 2014, 1992  . Sleep apnea    no cpap used  . Unspecified hypothyroidism     Patient Active Problem List   Diagnosis Date Noted  . Positional sleep apnea 09/10/2016  . Periodic limb movements of sleep 09/10/2016  . OSA on CPAP 02/08/2016  . S/P revision right [facility] 09/18/2015  . Leg swelling 08/21/2014  . Acute URI 04/04/2014  . Health care maintenance 12/16/2013  . Other and unspecified hyperlipidemia 11/04/2013  . 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 S/P hip hemiarthroplasty 03/15/2013  . Dislocation of hip prosthesis ([place]) 03/12/2013  . Hip dislocation, left ([place]) 03/12/2013  . Hip dislocation, right ([place]) 03/12/2013  . Urinary retention with incomplete bladder emptying 03/09/2013  . Dysphagia, pharyngoesophageal phase 03/09/2013  . Femoral neck fracture ([place]) 03/08/2013  . Unspecified constipation 02/26/2013  . Fractured femoral neck ([place]) 02/23/2013  . Hip  fracture ([place]) 02/23/2013  . Excessive flatus 10/21/2012  . Cold feet 07/29/2012  . Hyponatremia 05/20/2012  . Calf pain 11/17/2011  . Dyspnea 12/06/2007  . OSTEOPENIA 09/17/2007  . Hypothyroidism 04/01/2007  . Multiple sclerosis ([place]) 04/01/2007  . Other diseases of vocal cords 04/01/2007    Past Surgical History:  Procedure Laterality Date  . ABDOMINAL HYSTERECTOMY  2005   for cervical cancer, complete  . CONVERSION TO TOTAL HIP Right 09/18/2015   Procedure: CONVERSION OF RIGHT HEMI ARTHROPLASTY TO TOTAL HIP ARTHROPLASTY ACETABULAR REVISON ;  Surgeon: Paralee Cancel, MD;  [facility];  Service: Orthopedics;  Laterality: Right;  . EYE SURGERY Bilateral    lens for cataracts  . HIP ARTHROPLASTY Right 03/01/2013   Procedure: ARTHROPLASTY BIPOLAR HIP;  Surgeon: Mauri Pole, MD;  [facility];  Service: Orthopedics;  Laterality: Right;  . HIP CLOSED REDUCTION Right 03/12/2013   Procedure: CLOSED REDUCTION HIP;  Surgeon: Gearlean Alf, MD;  Location: [facility];  Service: Orthopedics;  Laterality: Right;  . HIP CLOSED REDUCTION Right 03/19/2013   Procedure: CLOSED REDUCTION HIP;  Surgeon: Johnn Hai, MD;  Location: [facility];  Service: Orthopedics;  Laterality: Right;  . HIP CLOSED REDUCTION Right 11/10/2015   Procedure: CLOSED MANIPULATION HIP;  Surgeon: Rod Can, MD;  [facility];  Service: Orthopedics;  Laterality: Right;  Marland Kitchen VESICOVAGINAL FISTULA CLOSURE W/ TAH  2005    OB  History    No data available       Home Medications    Prior to Admission medications   Medication Sig Start Date End Date Taking? Authorizing Provider  acidophilus (RISAQUAD) CAPS capsule Take 1 capsule by mouth daily.    [provider]  amphetamine-dextroamphetamine (ADDERALL) 20 MG tablet Take 20 mg by mouth 2 (two) times daily.     [provider]  baclofen (LIORESAL) 20 MG tablet Take 20 mg by mouth 3 (three) times daily.     Love, Pamela S, PA-C  Calcium  Carbonate-Vitamin D (CALCIUM 600+D) 600-400 MG-UNIT tablet Take 1 tablet by mouth daily.    [provider]  cholecalciferol (VITAMIN D) 1000 UNITS tablet Take 2,000 Units by mouth daily.     [provider]  dexlansoprazole (DEXILANT) 60 MG capsule Take 1 capsule (60 mg total) by mouth daily before breakfast. 02/08/16   Tanda Rockers, MD  DULERA 100-5 MCG/ACT AERO INHALE 2 PUFFS IN THE AM AND 2 PUFFS IN THE PM ABOUT 12 HOURS APART. 05/16/16   Tanda Rockers, MD  famotidine (PEPCID) 20 MG tablet Take 20 mg by mouth at bedtime.    [provider]  FLUoxetine (PROZAC) 40 MG capsule Take 40 mg by mouth daily.      [provider]  furosemide (LASIX) 20 MG tablet Take 1 tablet (20 mg total) by mouth daily. 08/08/16   Tanda Rockers, MD  gabapentin (NEURONTIN) 600 MG tablet Take 600 mg by mouth 2 (two) times daily.    [provider]  levothyroxine (SYNTHROID, LEVOTHROID) 100 MCG tablet Take 1 tablet (100 mcg total) by mouth daily before breakfast. 02/08/16   Tanda Rockers, MD  Multiple Vitamin (MULTIVITAMIN WITH MINERALS) TABS tablet Take 1 tablet by mouth daily.    [provider]  ocrelizumab (OCREVUS) 300 MG/10ML injection Inject 300 mg into the vein every 6 (six) months.     [provider]  temazepam (RESTORIL) 15 MG capsule Take 15 mg by mouth at bedtime as needed for sleep.    [provider]  VENTOLIN HFA 108 (90 Base) MCG/ACT inhaler INHALE 2 PUFFS INTO LUNGS EVERY 6 HOURS AS NEEDED. 05/16/16   Tanda Rockers, MD    Family History Family History  Problem Relation Age of Onset  . Colon cancer Father     Social History Social History  Substance Use Topics  . Smoking status: Never Smoker  . Smokeless tobacco: Never Used  . Alcohol use Yes     Comment: rare     Allergies   Iohexol; Methylprednisolone; Morphine and related; Oxycodone-acetaminophen; Phenytoin sodium extended; and Zanaflex [tizanidine  hydrochloride]   Review of Systems Review of Systems  Constitutional: Negative for activity change.  Gastrointestinal: Negative for nausea and vomiting.  Genitourinary: Positive for decreased urine volume. Negative for flank pain.  Neurological: Negative for weakness.  All other systems reviewed and are negative.    Physical Exam Updated Vital Signs BP 131/69 (BP Location: Left Arm)   Pulse 64   Temp 98 F (36.7 C) (Oral)   Resp 20   SpO2 100%   Physical Exam  Constitutional: She is oriented to person, place, and time. She appears well-developed.  HENT:  Head: Normocephalic and atraumatic.  Eyes: EOM are normal.  Neck: Normal range of motion. Neck supple.  Cardiovascular: Normal rate.   Pulmonary/Chest: Effort normal.  Abdominal: Bowel sounds are normal. There is no tenderness.  Neurological: She is  alert and oriented to person, place, and time.  Skin: Skin is warm and dry.  Nursing note and vitals reviewed.    ED Treatments / Results  Labs (all labs ordered are listed, but only abnormal results are displayed) Labs Reviewed  URINALYSIS, ROUTINE W REFLEX MICROSCOPIC - Abnormal; Notable for the following:       Result Value   Hgb urine dipstick MODERATE (*)    Ketones, ur 5 (*)    Bacteria, UA RARE (*)    All other components within normal limits  BASIC METABOLIC PANEL - Abnormal; Notable for the following:    Sodium 123 (*)    Chloride 89 (*)    All other components within normal limits  CBC WITH DIFFERENTIAL/PLATELET - Abnormal; Notable for the following:    RBC 3.71 (*)    Hemoglobin 11.5 (*)    HCT 32.4 (*)    All other components within normal limits    EKG  EKG Interpretation None       Radiology No results found.  Procedures Procedures (including critical care time)  Medications Ordered in ED Medications - No data to display   Initial Impression / Assessment and Plan / ED Course  I have reviewed the triage vital signs and the nursing  notes.  Pertinent labs & imaging results that were available during my care of the patient were reviewed by me and considered in my medical decision making (see chart for details).  Clinical Course as of Nov 09 250  Sat Nov 08, 2016  0246 Pt is now making normal urine. Sodium is low. She reports to me that she has been drinking more fluid than normal, since she was asked to drink water - so I suspect the hyponatremia is due to dilution. Pt denies ataxia, confusion, seizures, weakness at this time. We have discussed those return precautions and advised to not drink too much water. She is to see Dr. Melvyn Novas on Monday, we have asked her to get her BMP checked.  [AN]    Clinical Course User Index [AN] Varney Biles, MD    Pt comes in with cc of urinary retention. However, our bladder scan revealed 13 cc of urine and the foley bag had >400 cc of urine in it. Labs ordered to ensure the renal function is normal, if it is not severely elevated, we will d/c with outpatient f/u.  Final Clinical Impressions(s) / ED Diagnoses   Final diagnoses:  Hyponatremia    New Prescriptions New Prescriptions   No medications on file     Varney Biles, MD 11/08/16 [phone]

## 2016-11-08 LAB — CBC WITH DIFFERENTIAL/PLATELET
Basophils Absolute: 0 10*3/uL (ref 0.0–0.1)
Basophils Relative: 0 %
Eosinophils Absolute: 0.2 10*3/uL (ref 0.0–0.7)
Eosinophils Relative: 4 %
HCT: 32.4 % — ABNORMAL LOW (ref 36.0–46.0)
Hemoglobin: 11.5 g/dL — ABNORMAL LOW (ref 12.0–15.0)
Lymphocytes Relative: 16 %
Lymphs Abs: 0.9 10*3/uL (ref 0.7–4.0)
MCH: 31 pg (ref 26.0–34.0)
MCHC: 35.5 g/dL (ref 30.0–36.0)
MCV: 87.3 fL (ref 78.0–100.0)
Monocytes Absolute: 0.5 10*3/uL (ref 0.1–1.0)
Monocytes Relative: 8 %
Neutro Abs: 4.3 10*3/uL (ref 1.7–7.7)
Neutrophils Relative %: 72 %
Platelets: 266 10*3/uL (ref 150–400)
RBC: 3.71 MIL/uL — ABNORMAL LOW (ref 3.87–5.11)
RDW: 13.2 % (ref 11.5–15.5)
WBC: 6 10*3/uL (ref 4.0–10.5)

## 2016-11-08 LAB — URINALYSIS, ROUTINE W REFLEX MICROSCOPIC
Bilirubin Urine: NEGATIVE
Glucose, UA: NEGATIVE mg/dL
Ketones, ur: 5 mg/dL — AB
Leukocytes, UA: NEGATIVE
Nitrite: NEGATIVE
Protein, ur: NEGATIVE mg/dL
Specific Gravity, Urine: 1.011 (ref 1.005–1.030)
Squamous Epithelial / LPF: NONE SEEN
pH: 6 (ref 5.0–8.0)

## 2016-11-08 LAB — BASIC METABOLIC PANEL
Anion gap: 9 (ref 5–15)
BUN: 11 mg/dL (ref 6–20)
CO2: 25 mmol/L (ref 22–32)
Calcium: 8.9 mg/dL (ref 8.9–10.3)
Chloride: 89 mmol/L — ABNORMAL LOW (ref 101–111)
Creatinine, Ser: 0.79 mg/dL (ref 0.44–1.00)
GFR calc Af Amer: >60 mL/min (ref >60)
GFR calc non Af Amer: >60 mL/min (ref >60)
Glucose, Bld: 92 mg/dL (ref 65–99)
Potassium: 3.7 mmol/L (ref 3.5–5.1)
Sodium: 123 mmol/L — ABNORMAL LOW (ref 135–145)

## 2016-11-08 NOTE — Discharge Instructions (Signed)
The labs show low sodium with normal creatinine / kidney function.  Sodium is low due to over water drinking. We advise not to drink too much water tomorrow, drink normal amount, when thirsty. If there is confusion, weakness, seizures - return to the ER.  Have Dr. Melvyn Novas check your sodium when you see them on Monday.

## 2016-11-10 ENCOUNTER — Ambulatory Visit (INDEPENDENT_AMBULATORY_CARE_PROVIDER_SITE_OTHER): Payer: BLUE CROSS/BLUE SHIELD | Admitting: Internal Medicine

## 2016-11-10 ENCOUNTER — Other Ambulatory Visit (INDEPENDENT_AMBULATORY_CARE_PROVIDER_SITE_OTHER): Payer: BLUE CROSS/BLUE SHIELD

## 2016-11-10 ENCOUNTER — Encounter: Payer: Self-pay | Admitting: Internal Medicine

## 2016-11-10 VITALS — BP 120/60 | HR 96 | Ht 63.0 in | Wt 122.8 lb

## 2016-11-10 DIAGNOSIS — E038 Other specified hypothyroidism: Secondary | ICD-10-CM

## 2016-11-10 DIAGNOSIS — J45991 Cough variant asthma: Secondary | ICD-10-CM | POA: Diagnosis not present

## 2016-11-10 DIAGNOSIS — E871 Hypo-osmolality and hyponatremia: Secondary | ICD-10-CM | POA: Diagnosis not present

## 2016-11-10 LAB — BASIC METABOLIC PANEL
BUN: 8 mg/dL (ref 6–23)
CO2: 28 mEq/L (ref 19–32)
Calcium: 9.3 mg/dL (ref 8.4–10.5)
Chloride: 92 mEq/L — ABNORMAL LOW (ref 96–112)
Creatinine, Ser: 0.77 mg/dL (ref 0.40–1.20)
GFR: 80.14 mL/min (ref >60.00)
Glucose, Bld: 102 mg/dL — ABNORMAL HIGH (ref 70–99)
Potassium: 4.2 mEq/L (ref 3.5–5.1)
Sodium: 127 mEq/L — ABNORMAL LOW (ref 135–145)

## 2016-11-10 NOTE — Patient Instructions (Addendum)
Plan A = Automatic = dulera 100 2 pffs each am and then only take the pm the dose if you are not doing great and/or needing your rescue inhaler   Work on inhaler technique:  relax and gently blow all the way out then take a nice smooth deep breath back in, triggering the inhaler at same time you start breathing in.  Hold for up to 5 seconds if you can. Blow out thru nose. Rinse and gargle with water when done     Plan B = Backup Only use your albuterol (ventolin) as a rescue medication to be used if you can't catch your breath by resting or doing a relaxed purse lip breathing pattern.  - The less you use it, the better it will work when you need it. - Ok to use the inhaler up to 2 puffs  every 4 hours if you must but call for appointment if use goes up over your usual need - Don't leave home without it !!  (think of it like the spare tire for your car)    Please remember to go to the lab department downstairs in the basement  for your tests - we will call you with the results when they are available.  Drink gatorade if you need to push more fluids for any reason   Please schedule a follow up visit in 3 months but call sooner if needed

## 2016-11-10 NOTE — Progress Notes (Signed)
Spoke with pt and notified of results per Dr. [name]. Pt verbalized understanding and denied any questions. 

## 2016-11-10 NOTE — Progress Notes (Signed)
Subjective:    Patient ID: [name], female    DOB: Dec 15, 1950   MRN: [id number]   Brief patient profile:  18 yowf never smoker with MS since Oct 96 all neuro care through [name] in [name] ([facility] of [name]),   [facility]la [name] at [facility] urology,  [name] ortho  Followed here for Primary care/hypothyroid     History of Present Illness  October 04, 2009 Followup with PFT's. Pt states that her breathing is no better since last seen June 2011 for acute visit. She states she is still wheezing- ventolin helps and is using this up to tid. w/u c/w anemia  Mild dysphagia no more than usual. occ waking up wheezing at night despite zegerid at hs.   rec  Try  use dulera 100 2 bid prn   September 18 2015  R THR [name]    10/01/2015  f/u ov/[name] re: sob s/p et on dulera 100 2bid and dexilant q am  Chief Complaint  Patient presents with  . Acute Visit    Pt c/o increased DOE since had hip replacement 09/18/15. She states she gets SOB with just talking, showering or walking short distances.   has cpap machine but not using at all  - prev eval and rx by [facility]la [name]  rec Add pepcid 20 mg x one at bedtime Prednisone 10 mg take  4 each am x 2 days,   2 each am x 2 days,  1 each am x 2 days and stop  Start back on cpap See [name] asap   11/06/2015  f/u ov/[name] re:  cpx  Chief Complaint  Patient presents with  . Follow-up    cpx- pt states she is doing well today, no complaints.     breathing better at night on cpap per [name]  Doe = wm shopping / was not limited at curves prior to hip surgery but has not resumed saba once a week / no cough  rec Prevnar 13 today Please remember to go to the lab  department downstairs for your tests - we will call you with the results when they are available. 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 Follow-up    Doing well and denies any co's today.   back in CPAP as of July 2017  > no change in symptoms but wakes her up a lot / f/u per [name] planned  rec See [name] for all issues related to your cpap machine but it appears to be working fine by your download today  No change in medications and recs     08/08/2016  f/u ov/[name] re: multiple concerns 1) check thyroid - clinically no symptoms 2) feet stay cold/ blue with neg vascular w/u to date 3) wants referral to sports medicine as needs stay hyperextended due to MS/ no problem with falling just using cane Rec Please see patient coordinator before you leave today  to schedule referal to sports medicine and vascular surgery Please remember to go to the lab department downstairs in the basement  for your tests - we will call you with the results when they are available. Please schedule a follow up visit in 3 months but call sooner if needed  11/10/2016  f/u ov/[name] re: hyponatremia  Chief Complaint  Patient presents with  . Follow-up    recent visit to ED- 11/07/16 for hyponatremia- sodium level was 123.   Bladder dysfunction / foley dep since 11/04/16 > f/u [name] Knee hyperext better with support  Hyponatremia/ no symptoms on  Lasix daily  cpap d/c 'd 09/03/2016 with RLS not bothersome to her > neurology f/u  Asthma vs vcd    dulera 100 2bid / hfa with some sense of globus but no cough or increased sob/ noct symptoms or need for saba    No obvious day to day or daytime variability or assoc excess/ purulent sputum or mucus plugs or hemoptysis or cp or chest tightness, subjective wheeze or overt sinus or hb symptoms. No unusual exp hx or h/o childhood pna/ asthma or knowledge of premature birth.  Sleeping ok without nocturnal  or early am exacerbation  of respiratory  c/o's or need for noct saba. Also denies any obvious fluctuation of symptoms with weather or environmental changes or  other aggravating or alleviating factors except as outlined above   Current Medications, Allergies, Complete Past Medical History, Past Surgical History, Family History, and Social History were reviewed in [employer] record.  ROS  The following are not active complaints unless bolded sore throat, dysphagia, dental problems, itching, sneezing,  nasal congestion or excess/ purulent secretions, ear ache,   fever, chills, sweats, unintended wt loss, classically pleuritic or exertional cp,  orthopnea pnd or leg swelling, presyncope, palpitations, abdominal pain, anorexia, nausea, vomiting, diarrhea  or change in bowel or bladder habits, change in stools or urine, dysuria,hematuria,  rash, arthralgias, visual complaints, headache, numbness, weakness or ataxia or problems with walking or coordination,  change in mood/affect or memory.                   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  [name]  Bladder dysfunction ...............................................Marland Kitchen  [name]  Dyspnea/wheeze ? asthma  - HFA 50 % after coaching October 04, 2009 >  50% 06/25/2010  - PFT's October 04, 2009 FEV1 2.15 (95%) with ratio 69 and DLCO 58> corrects to 98, mild insp truncation  Health Maintenance.............................................Marland KitchenWert  - Pneumovax March 28, 2009 and Prevnar 13 11/06/2015  - Td  April 10, 2009  - CPX 11/06/2015        Objective:   Physical Exam  amb  white female nad / walks with cane / variably hoarse with voice fatigue   Vital signs removed -  Note on arrival 02 sats  96% on RA    Wt  118 > 127 March 28, 2009 >129 June 17 > 128 October 04, 2009 > 129 06/25/2010 >125 10/29/2010 > 128 11/26/2010 >>127 12/10/2010 > 11/17/2011  129> 126 05/20/2012 > 07/29/2012  129 > 10/20/2012  129 >  135 06/21/2013 >  11/04/2013 135 >134 04/04/2014 > 04/18/2014 135 >  07/17/2014 132> 08/17/2014  135 > 12/20/2014 > 01/17/2015 134   > 08/08/2015   126 >  10/01/2015   130 > 11/06/2015   130 > 11/10/2016  123    HEENT: nl dentition, turbinates, and orophanx. Nl external ear canals without  cough reflex  Neck without JVD/Nodes/TM  Lungs clear with   transmitted noises from upper airway - otherwise appears clear   RRR no s3 or murmur or increase in P2  Abd soft and benign with nl excursion in the supine position. No bruits or organomegaly / no cvat Ext warm hands, sym cool feet with nl pulses and  without calf tenderness, cyanosis clubbing -  sym edema and slt  purplish discoloration Skin warm and dry without lesions Neuro:  nl sensorium, walks with cane, no obvious motor deficits / does walk with hyperextension of both knees  C/w quads weak symmetrically  GU:  Foley with leg bag in place draining clear urine    Labs ordered/ reviewed:      Chemistry      Component Value Date/Time   NA 127 (L) 11/10/2016 1004   K 4.2 11/10/2016 1004   CL 92 (L) 11/10/2016 1004   CO2 28 11/10/2016 1004   BUN 8 11/10/2016 1004   CREATININE 0.77 11/10/2016 1004      Component Value Date/Time   CALCIUM 9.3 11/10/2016 1004   ALKPHOS 66 11/10/2015 1448   AST 49 (H) 11/10/2015 1448   ALT 12 (L) 11/10/2015 1448   BILITOT 1.0 11/10/2015 1448        Lab Results  Component Value Date   WBC 6.0 11/07/2016   HGB 11.5 (L) 11/07/2016   HCT 32.4 (L) 11/07/2016   MCV 87.3 11/07/2016   PLT 266 11/07/2016                       Assessment & Plan:

## 2016-11-12 ENCOUNTER — Encounter: Payer: Self-pay | Admitting: Internal Medicine

## 2016-11-12 NOTE — Assessment & Plan Note (Signed)
11/10/2016  After extensive coaching HFA effectiveness =    75% from a baseline of 50%  - 11/10/2016 rec try just taking 2 pffs each am since having sense of globus on 2bid with baseline poor technique

## 2016-11-12 NOTE — Assessment & Plan Note (Signed)
10/2011 132 >127 05/17/12 >130 05/20/2012 >fluid restrictions> na 132 10/20/12  - d/c'd hctz with na 126 on 08/17/14 > rx lasix > resolved 12/20/2014  - recurred  11/07/16 in setting of pushing fluids for ? uti that turned out to be bladder dysfunction ? Related to MS   Since had no symptoms at all this is most likely a reset osmostat issue that was aggravated by pushing hypotonic fluids but never severe as eventually the adh was suppressed by Na below the reset  All she needs to do is drink a little extra gatorade as needed and avoid the hypotonic fluids other wise   Each maintenance medication was reviewed in detail including most importantly the difference between maintenance and as needed and under what circumstances the prns are to be used.  Please see AVS for specific  Instructions which are unique to this visit and I personally typed out  which were reviewed in detail in writing with the patient and a copy provided.

## 2016-11-12 NOTE — Assessment & Plan Note (Signed)
Lab Results  Component Value Date   TSH 1.59 08/08/2016     Not likely the cause of the hyponatremia/ clinically euthyroid

## 2016-12-08 ENCOUNTER — Ambulatory Visit: Payer: BLUE CROSS/BLUE SHIELD | Admitting: Sports Medicine

## 2016-12-24 ENCOUNTER — Institutional Professional Consult (permissible substitution): Payer: BLUE CROSS/BLUE SHIELD | Admitting: Pulmonary Disease

## 2017-01-07 ENCOUNTER — Emergency Department (HOSPITAL_COMMUNITY): Payer: BLUE CROSS/BLUE SHIELD

## 2017-01-07 ENCOUNTER — Encounter (HOSPITAL_COMMUNITY): Payer: Self-pay

## 2017-01-07 ENCOUNTER — Emergency Department (HOSPITAL_COMMUNITY)
Admission: EM | Admit: 2017-01-07 | Discharge: 2017-01-07 | Disposition: A | Discharge status: Home / Self Care | Payer: BLUE CROSS/BLUE SHIELD | Attending: Emergency Medicine | Admitting: Emergency Medicine

## 2017-01-07 DIAGNOSIS — Z79899 Other long term (current) drug therapy: Secondary | ICD-10-CM | POA: Insufficient documentation

## 2017-01-07 DIAGNOSIS — Y999 Unspecified external cause status: Secondary | ICD-10-CM | POA: Insufficient documentation

## 2017-01-07 DIAGNOSIS — Y92009 Unspecified place in unspecified non-institutional (private) residence as the place of occurrence of the external cause: Secondary | ICD-10-CM | POA: Diagnosis not present

## 2017-01-07 DIAGNOSIS — J449 Chronic obstructive pulmonary disease, unspecified: Secondary | ICD-10-CM | POA: Insufficient documentation

## 2017-01-07 DIAGNOSIS — Z96641 Presence of right artificial hip joint: Secondary | ICD-10-CM | POA: Insufficient documentation

## 2017-01-07 DIAGNOSIS — W07XXXA Fall from chair, initial encounter: Secondary | ICD-10-CM | POA: Diagnosis not present

## 2017-01-07 DIAGNOSIS — S42201A Unspecified fracture of upper end of right humerus, initial encounter for closed fracture: Secondary | ICD-10-CM | POA: Diagnosis present

## 2017-01-07 DIAGNOSIS — E039 Hypothyroidism, unspecified: Secondary | ICD-10-CM | POA: Diagnosis not present

## 2017-01-07 DIAGNOSIS — Y939 Activity, unspecified: Secondary | ICD-10-CM | POA: Insufficient documentation

## 2017-01-07 DIAGNOSIS — S42294A Other nondisplaced fracture of upper end of right humerus, initial encounter for closed fracture: Secondary | ICD-10-CM

## 2017-01-07 LAB — I-STAT CHEM 8, ED
BUN: 17 mg/dL (ref 6–20)
Calcium, Ion: 1.04 mmol/L — ABNORMAL LOW (ref 1.15–1.40)
Chloride: 99 mmol/L — ABNORMAL LOW (ref 101–111)
Creatinine, Ser: 0.6 mg/dL (ref 0.44–1.00)
Glucose, Bld: 96 mg/dL (ref 65–99)
HCT: 32 % — ABNORMAL LOW (ref 36.0–46.0)
Hemoglobin: 10.9 g/dL — ABNORMAL LOW (ref 12.0–15.0)
Potassium: 4.9 mmol/L (ref 3.5–5.1)
Sodium: 132 mmol/L — ABNORMAL LOW (ref 135–145)
TCO2: 26 mmol/L (ref 22–32)

## 2017-01-07 MED ORDER — IBUPROFEN 400 MG PO TABS: 400.0000 mg | ORAL_TABLET | Freq: Three times a day (TID) | ORAL | 0 refills | Status: DC | PRN

## 2017-01-07 MED ORDER — LACTATED RINGERS IV BOLUS (SEPSIS)
2000.0000 mL | Freq: Once | INTRAVENOUS | Status: AC
  Administered 2017-01-07: 2000 mL via INTRAVENOUS

## 2017-01-07 MED ORDER — TRAMADOL HCL 50 MG PO TABS: 50.0000 mg | ORAL_TABLET | Freq: Two times a day (BID) | ORAL | 0 refills | Status: DC | PRN

## 2017-01-07 MED ORDER — ACETAMINOPHEN ER 650 MG PO TBCR: 650.0000 mg | EXTENDED_RELEASE_TABLET | Freq: Four times a day (QID) | ORAL | 0 refills | Status: DC

## 2017-01-07 MED ORDER — KETAMINE HCL-SODIUM CHLORIDE 100-0.9 MG/10ML-% IV SOSY
0.3000 mg/kg | PREFILLED_SYRINGE | Freq: Once | INTRAVENOUS | Status: AC
  Administered 2017-01-07: 17 mg via INTRAVENOUS
  Filled 2017-01-07: qty 10

## 2017-01-07 NOTE — ED Triage Notes (Signed)
Per EMS-  Patient has MS. Patient's right foot normally turns inward and patient tripped on her right foot causing her to fall to the floor, landing on her right shoulder. Patient c/o 10/10 pain. Patient was given Fentanyl 100 mcg IV prior to arrival to the ED.

## 2017-01-07 NOTE — ED Notes (Signed)
Patient transported to x-ray. ?

## 2017-01-07 NOTE — ED Provider Notes (Signed)
[place] DEPT Provider Note   CSN: [id number] Arrival date & time: 01/07/17  1432     History   Chief Complaint Chief Complaint  Patient presents with  . Fall  . Shoulder Pain  . Arm Pain    HPI [name] is a 66 y.o. female.  HPI  Patient comes in with chief complaint of fall. Patient has history of COPD, multiple sclerosis. Patient reports that she was trying to get onto a chair and had a fall. Patient fell onto her right side and is having pain over the right upper extremity. Patient denies any numbness, tingling. She denies any trauma to her head, and she denies nausea, vomiting, seizures, loss of consciousness or new visual complains, weakness, numbness, dizziness or gait instability. Patient's fall was around 11:00 in the morning husband came home and found her down around 2 PM. Patient was unable to get up and move because of pain and weakness. She denies any pelvic pain, chest pain, shortness of breath. Patient is not on any anticoagulant or antiplatelet agent.   Past Medical History:  Diagnosis Date  . Arthritis    oa  . Cervical cancer ([place]) 2005  . COPD (chronic obstructive pulmonary disease) ([place])   . Multiple sclerosis ([place])   . Osteoporosis   . Other diseases of vocal cords    vocal cords are not even  . Other voice and resonance disorders   . Pneumonia 2014, 1992  . Sleep apnea    no cpap used  . Unspecified hypothyroidism     Patient Active Problem List   Diagnosis Date Noted  . Cough variant asthma vs vcd  related to [facility]  11/10/2016  . Positional sleep apnea 09/10/2016  . Periodic limb movements of sleep 09/10/2016  . OSA on CPAP 02/08/2016  . S/P revision right [facility] 09/18/2015  . Leg swelling 08/21/2014  . Acute URI 04/04/2014  . Health care maintenance 12/16/2013  . Other and unspecified hyperlipidemia 11/04/2013  . 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 S/P hip hemiarthroplasty 03/15/2013  . Dislocation of hip  prosthesis ([place]) 03/12/2013  . Hip dislocation, left ([place]) 03/12/2013  . Hip dislocation, right ([place]) 03/12/2013  . Urinary retention with incomplete bladder emptying 03/09/2013  . Dysphagia, pharyngoesophageal phase 03/09/2013  . Femoral neck fracture ([place]) 03/08/2013  . Unspecified constipation 02/26/2013  . Fractured femoral neck ([place]) 02/23/2013  . Hip fracture ([place]) 02/23/2013  . Excessive flatus 10/21/2012  . Cold feet 07/29/2012  . Hyponatremia 05/20/2012  . Calf pain 11/17/2011  . Dyspnea 12/06/2007  . OSTEOPENIA 09/17/2007  . Hypothyroidism 04/01/2007  . Multiple sclerosis ([place]) 04/01/2007  . Other diseases of vocal cords 04/01/2007    Past Surgical History:  Procedure Laterality Date  . ABDOMINAL HYSTERECTOMY  2005   for cervical cancer, complete  . CONVERSION TO TOTAL HIP Right 09/18/2015   Procedure: CONVERSION OF RIGHT HEMI ARTHROPLASTY TO TOTAL HIP ARTHROPLASTY ACETABULAR REVISON ;  Surgeon: Paralee Cancel, MD;  Location: [facility];  Service: Orthopedics;  Laterality: Right;  . EYE SURGERY Bilateral    lens for cataracts  . HIP ARTHROPLASTY Right 03/01/2013   Procedure: ARTHROPLASTY BIPOLAR HIP;  Surgeon: Mauri Pole, MD;  Location: [facility];  Service: Orthopedics;  Laterality: Right;  . HIP CLOSED REDUCTION Right 03/12/2013   Procedure: CLOSED REDUCTION HIP;  Surgeon: Gearlean Alf, MD;  Location: [place];  Service: Orthopedics;  Laterality: Right;  . HIP CLOSED REDUCTION Right 03/19/2013   Procedure:  CLOSED REDUCTION HIP;  Surgeon: Johnn Hai, MD;  Location: [place];  Service: Orthopedics;  Laterality: Right;  . HIP CLOSED REDUCTION Right 11/10/2015   Procedure: CLOSED MANIPULATION HIP;  Surgeon: Rod Can, MD;  Location: [facility];  Service: Orthopedics;  Laterality: Right;  Marland Kitchen VESICOVAGINAL FISTULA CLOSURE W/ TAH  2005    OB History    No data available       Home Medications    Prior to Admission medications   Medication Sig Start Date End Date Taking?  Authorizing Provider  amphetamine-dextroamphetamine (ADDERALL) 20 MG tablet Take 20 mg by mouth 2 (two) times daily.    Yes [provider]  baclofen (LIORESAL) 20 MG tablet Take 20 mg by mouth 3 (three) times daily.    Yes Love, Ivan Anchors, PA-C  Calcium Carbonate-Vitamin D (CALCIUM 600+D) 600-400 MG-UNIT tablet Take 1 tablet by mouth daily.   Yes [provider]  cholecalciferol (VITAMIN D) 1000 UNITS tablet Take 2,000 Units by mouth daily.    Yes [provider]  dexlansoprazole (DEXILANT) 60 MG capsule Take 1 capsule (60 mg total) by mouth daily before breakfast. 02/08/16  Yes [name], Christena Deem, MD  DULERA 100-5 MCG/ACT AERO INHALE 2 PUFFS IN THE AM AND 2 PUFFS IN THE PM ABOUT 12 HOURS APART. 05/16/16  Yes Tanda Rockers, MD  FLUoxetine (PROZAC) 40 MG capsule Take 40 mg by mouth daily.     Yes [provider]  furosemide (LASIX) 20 MG tablet Take 1 tablet (20 mg total) by mouth daily. 08/08/16  Yes Tanda Rockers, MD  gabapentin (NEURONTIN) 600 MG tablet Take 600 mg by mouth 2 (two) times daily.   Yes [provider]  levothyroxine (SYNTHROID, LEVOTHROID) 100 MCG tablet Take 1 tablet (100 mcg total) by mouth daily before breakfast. 02/08/16  Yes Tanda Rockers, MD  Misc Natural Products (TART CHERRY ADVANCED PO) Take 1 capsule by mouth at bedtime.    Yes [provider]  Multiple Vitamin (MULTIVITAMIN WITH MINERALS) TABS tablet Take 1 tablet by mouth daily.   Yes [provider]  ocrelizumab (OCREVUS) 300 MG/10ML injection Inject 300 mg into the vein every 6 (six) months.    Yes [provider]  temazepam (RESTORIL) 15 MG capsule Take 15 mg by mouth at bedtime.    Yes [provider]  TURMERIC PO Take 500 mg by mouth 2 (two) times daily.    Yes [provider]  acetaminophen (TYLENOL 8 HOUR) 650 MG CR tablet Take 1 tablet (650 mg total) by mouth every 6 (six) hours. 01/07/17   Varney Biles, MD  ibuprofen  (ADVIL,MOTRIN) 400 MG tablet Take 1 tablet (400 mg total) by mouth every 8 (eight) hours as needed for moderate pain. 01/07/17   Varney Biles, MD  traMADol (ULTRAM) 50 MG tablet Take 1 tablet (50 mg total) by mouth every 12 (twelve) hours as needed for severe pain. 01/07/17   Varney Biles, MD  VENTOLIN HFA 108 (90 Base) MCG/ACT inhaler INHALE 2 PUFFS INTO LUNGS EVERY 6 HOURS AS NEEDED. Patient taking differently: INHALE 2 PUFFS INTO LUNGS EVERY 6 HOURS AS NEEDED FOR SOB AND WHEEZING 05/16/16   Tanda Rockers, MD    Family History Family History  Problem Relation Age of Onset  . Colon cancer Father     Social History Social History  Substance Use Topics  . Smoking status: Never Smoker  . Smokeless tobacco: Never Used  . Alcohol use Yes  Comment: rare     Allergies   Iohexol; Methylprednisolone; Morphine and related; Oxycodone-acetaminophen; Phenytoin sodium extended; and Zanaflex [tizanidine hydrochloride]   Review of Systems Review of Systems  Constitutional: Positive for activity change.  Respiratory: Negative for shortness of breath.   Cardiovascular: Negative for chest pain.  Skin: Positive for wound.  Hematological: Does not bruise/bleed easily.     Physical Exam Updated Vital Signs BP (!) 108/56 (BP Location: Left Arm)   Pulse 67   Temp 97.8 F (36.6 C) (Oral)   Resp 18   Ht 5\' 3"  (1.6 m)   Wt 56.7 kg (125 lb)   SpO2 96%   BMI 22.14 kg/m   Physical Exam  Constitutional: She is oriented to person, place, and time. She appears well-developed.  HENT:  Head: Normocephalic and atraumatic.  Eyes: EOM are normal.  Neck: Normal range of motion. Neck supple.  No midline c-spine tenderness, pt able to turn head to 45 degrees bilaterally without any pain and able to flex neck to the chest and extend without any pain or neurologic symptoms.  Cardiovascular: Normal rate and intact distal pulses.   Pulmonary/Chest: Effort normal.  Abdominal: Bowel sounds are  normal.  Musculoskeletal:  Tenderness over the R proximal shoulder and R forearm  Neurological: She is alert and oriented to person, place, and time. No cranial nerve deficit.  Skin: Skin is warm and dry.  Nursing note and vitals reviewed.    ED Treatments / Results  Labs (all labs ordered are listed, but only abnormal results are displayed) Labs Reviewed  I-STAT CHEM 8, ED - Abnormal; Notable for the following:       Result Value   Sodium 132 (*)    Chloride 99 (*)    Calcium, Ion 1.04 (*)    Hemoglobin 10.9 (*)    HCT 32.0 (*)    All other components within normal limits    EKG  EKG Interpretation None       Radiology Dg Forearm Right  Result Date: 01/07/2017 CLINICAL DATA:  Shoulder and arm pain. EXAM: RIGHT FOREARM - 2 VIEW COMPARISON:  None. FINDINGS: There is no evidence of fracture or other focal bone lesions. There is generalized osteopenia. Soft tissues are unremarkable. IMPRESSION: No acute osseous injury of the right forearm. Electronically Signed   By: Kathreen Devoid   On: 01/07/2017 15:33   Dg Humerus Right  Result Date: 01/07/2017 CLINICAL DATA:  Fall. EXAM: RIGHT HUMERUS - 2+ VIEW COMPARISON:  None. FINDINGS: Transverse fracture right humeral neck with mild impaction and mild displacement. Normal shoulder alignment. No other fracture. IMPRESSION: Transverse fracture right humeral neck Electronically Signed   By: Franchot Gallo M.D.   On: 01/07/2017 15:34    Procedures Procedures (including critical care time)  Medications Ordered in ED Medications  lactated ringers bolus 2,000 mL (2,000 mLs Intravenous New Bag/Given 01/07/17 1622)  ketamine 100 mg in normal saline 10 mL (10mg /mL) syringe (not administered)     Initial Impression / Assessment and Plan / ED Course  I have reviewed the triage vital signs and the nursing notes.  Pertinent labs & imaging results that were available during my care of the patient were reviewed by me and considered in my  medical decision making (see chart for details).     Patient comes in with chief complaint of fall. Brain and C-spine cleared clinically. Patient's fall was around 10:00 in the morning, she is 5 hours since the fall but I saw her.  Patient has no red flagson history that are concerning for intracranial bleed and her neurologic exam is non-focal. Patient is having right upper extremity pain and the x-ray shows a nondisplaced humeral neck fracture.  Results from the ER workup discussed with the patient face to face and all questions answered to the best of my ability.  Since patient was on the floor for 2 or 3 hours after the fall we will check Chem-8 and give patient some IV hydration.   Final Clinical Impressions(s) / ED Diagnoses   Final diagnoses:  Other closed nondisplaced fracture of proximal end of right humerus, initial encounter    New Prescriptions New Prescriptions   ACETAMINOPHEN (TYLENOL 8 HOUR) 650 MG CR TABLET    Take 1 tablet (650 mg total) by mouth every 6 (six) hours.   IBUPROFEN (ADVIL,MOTRIN) 400 MG TABLET    Take 1 tablet (400 mg total) by mouth every 8 (eight) hours as needed for moderate pain.   TRAMADOL (ULTRAM) 50 MG TABLET    Take 1 tablet (50 mg total) by mouth every 12 (twelve) hours as needed for severe pain.     Varney Biles, MD 01/07/17 [phone]

## 2017-01-07 NOTE — ED Triage Notes (Signed)
Patient reports that she is having more right arm pain than shoulder pain.

## 2017-01-07 NOTE — Discharge Instructions (Signed)
Please see the orthopedist in 1 week for your fracture.

## 2017-02-10 ENCOUNTER — Ambulatory Visit (INDEPENDENT_AMBULATORY_CARE_PROVIDER_SITE_OTHER): Payer: BLUE CROSS/BLUE SHIELD | Admitting: Internal Medicine

## 2017-02-10 ENCOUNTER — Encounter: Payer: Self-pay | Admitting: Internal Medicine

## 2017-02-10 VITALS — BP 118/72 | HR 78 | Ht 63.0 in | Wt 133.2 lb

## 2017-02-10 DIAGNOSIS — J45909 Unspecified asthma, uncomplicated: Secondary | ICD-10-CM

## 2017-02-10 DIAGNOSIS — M7989 Other specified soft tissue disorders: Secondary | ICD-10-CM

## 2017-02-10 DIAGNOSIS — R0600 Dyspnea, unspecified: Secondary | ICD-10-CM

## 2017-02-10 DIAGNOSIS — E038 Other specified hypothyroidism: Secondary | ICD-10-CM | POA: Diagnosis not present

## 2017-02-10 DIAGNOSIS — R0609 Other forms of dyspnea: Secondary | ICD-10-CM

## 2017-02-10 DIAGNOSIS — Z23 Encounter for immunization: Secondary | ICD-10-CM | POA: Diagnosis not present

## 2017-02-10 NOTE — Patient Instructions (Addendum)
Flu shot today    Please schedule a follow up visit in 3 months but call sooner if needed

## 2017-02-10 NOTE — Progress Notes (Signed)
Subjective:    Patient ID: Kayla [name], female    DOB: Dec 15, 1950   MRN: [id number]   Brief patient profile:  18 yowf never smoker with MS since Oct 96 all neuro care through Kayla [name] in [facility] ([facility] of [facility]),   Kayla [name] at [facility] urology,  Kayla [name]  Followed here for Primary care/hypothyroid     History of Present Illness  October 04, 2009 Followup with PFT's. Pt states that her breathing is no better since last seen June 2011 for acute visit. She states she is still wheezing- ventolin helps and is using this up to tid. w/u c/w anemia  Mild dysphagia no more than usual. occ waking up wheezing at night despite zegerid at hs.   rec  Try  use dulera 100 2 bid prn   September 18 2015  R THR Kayla [name]    10/01/2015  f/u ov/Kayla [name] re: sob s/p et on dulera 100 2bid and dexilant q am  Chief Complaint  Patient presents with  . Acute Visit    Pt c/o increased DOE since had hip replacement 09/18/15. She states she gets SOB with just talking, showering or walking short distances.   has cpap machine but not using at all  - prev eval and rx by Kayla [name]  rec Add pepcid 20 mg x one at bedtime Prednisone 10 mg take  4 each am x 2 days,   2 each am x 2 days,  1 each am x 2 days and stop  Start back on cpap See Kayla [name] asap   11/06/2015  f/u ov/Kayla [name] re:  cpx  Chief Complaint  Patient presents with  . Follow-up    cpx- pt states she is doing well today, no complaints.     breathing better at night on cpap per Kayla [name]  Doe = wm shopping / was not limited at [employer] prior to hip surgery but has not resumed saba once a week / no cough  rec Prevnar 13 today Please remember to go to the lab  department downstairs for your tests - we will call you with the results when they are available. 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 Follow-up    Doing well and denies any co's today.   back in CPAP as of July 2017  > no change in symptoms but wakes her up a lot / f/u per Kayla [name] planned  rec See Kayla [name] for all issues related to your cpap machine but it appears to be working fine by your download today  No change in medications and recs     08/08/2016  f/u ov/Kayla [name] re: multiple concerns 1) check thyroid - clinically no symptoms 2) feet stay cold/ blue with neg vascular w/u to date 3) wants referral to sports medicine as needs stay hyperextended due to MS/ no problem with falling just using cane Rec Please see patient coordinator before you leave today  to schedule referal to sports medicine and vascular surgery Please remember to go to the lab department downstairs in the basement  for your tests - we will call you with the results when they are available. Please schedule a follow up visit in 3 months but call sooner if needed  11/10/2016  f/u ov/Kayla [name] re: hyponatremia  Chief Complaint  Patient presents with  . Follow-up    recent visit to ED- 11/07/16 for hyponatremia- sodium level was 123.   Bladder dysfunction / foley dep since 11/04/16 > f/u Kayla [name] hyperext better with support  Hyponatremia/ no symptoms on  Lasix daily  cpap d/c 'd 09/03/2016 with RLS not bothersome to her > neurology f/u  Asthma vs vcd    dulera 100 2bid / hfa with some sense of globus but no cough or increased sob/ noct symptoms or need for saba  rec Plan A = Automatic = dulera 100 2 pffs each am and then only take the pm the dose if you are not doing great and/or needing your rescue inhaler  Work on inhaler technique:      Plan B = Backup Only use your albuterol (ventolin) as a rescue medication   Drink gatorade if you need to push more fluids for any reason      02/10/2017  f/u ov/Kayla [name] re: asthma/ vcd / hypothyroid Chief Complaint  Patient presents with  . Follow-up    Pt is sleeping  in a recliner last 45 days due to fracture in right shoulder.     Really Not limited by breathing from desired activities      No obvious day to day or daytime variability or assoc excess/ purulent sputum or mucus plugs or hemoptysis or cp or chest tightness, subjective wheeze or overt sinus or hb symptoms. No unusual exp hx or h/o childhood pna/ asthma or knowledge of premature birth.  Sleeping ok flat without nocturnal  or early am exacerbation  of respiratory  c/o's or need for noct saba. Also denies any obvious fluctuation of symptoms with weather or environmental changes or other aggravating or alleviating factors except as outlined above   Current Allergies, Complete Past Medical History, Past Surgical History, Family History, and Social History were reviewed in [employer] record.  ROS  The following are not active complaints unless bolded Hoarseness, sore throat, dysphagia, dental problems, itching, sneezing,  nasal congestion or discharge of excess mucus or purulent secretions, ear ache,   fever, chills, sweats, unintended wt loss or wt gain, classically pleuritic or exertional cp,  orthopnea pnd or leg swelling, presyncope, palpitations, abdominal pain, anorexia, nausea, vomiting, diarrhea  or change in bowel habits or change in bladder habits, change in stools or change in urine, dysuria, hematuria,  rash, arthralgias, visual complaints, headache, numbness, weakness or ataxia or problems with walking or coordination,  change in mood/affect or memory.        Current Meds  Medication Sig  . acetaminophen (TYLENOL 8 HOUR) 650 MG CR tablet Take 1 tablet (650 mg total) by mouth every 6 (six) hours.  Marland Kitchen amphetamine-dextroamphetamine (ADDERALL) 20 MG tablet Take 20 mg by mouth 2 (two) times daily.   . baclofen (LIORESAL) 20 MG tablet Take 20 mg by mouth 3 (three) times daily.   . Calcium Carbonate-Vitamin D (CALCIUM 600+D) 600-400 MG-UNIT tablet Take 1 tablet by mouth  daily.  . cholecalciferol (VITAMIN D) 1000 UNITS tablet Take 2,000 Units by mouth daily.   Marland Kitchen dexlansoprazole (DEXILANT) 60 MG capsule Take 1 capsule (60 mg total) by mouth daily before breakfast.  . DULERA 100-5 MCG/ACT AERO INHALE 2 PUFFS IN THE AM AND 2 PUFFS IN THE PM ABOUT 12 HOURS APART.  Marland Kitchen FLUoxetine (PROZAC) 40 MG capsule Take 40 mg by mouth daily.    . furosemide (  LASIX) 20 MG tablet Take 1 tablet (20 mg total) by mouth daily.  Marland Kitchen gabapentin (NEURONTIN) 600 MG tablet Take 600 mg by mouth 2 (two) times daily.  Marland Kitchen ibuprofen (ADVIL,MOTRIN) 400 MG tablet Take 1 tablet (400 mg total) by mouth every 8 (eight) hours as needed for moderate pain.  Marland Kitchen levothyroxine (SYNTHROID, LEVOTHROID) 100 MCG tablet Take 1 tablet (100 mcg total) by mouth daily before breakfast.  . Misc Natural Products (TART CHERRY ADVANCED PO) Take 1 capsule by mouth at bedtime.   . Multiple Vitamin (MULTIVITAMIN WITH MINERALS) TABS tablet Take 1 tablet by mouth daily.  Marland Kitchen ocrelizumab (OCREVUS) 300 MG/10ML injection Inject 300 mg into the vein every 6 (six) months.   . temazepam (RESTORIL) 15 MG capsule Take 15 mg by mouth at bedtime.   . TURMERIC PO Take 500 mg by mouth 2 (two) times daily.   . VENTOLIN HFA 108 (90 Base) MCG/ACT inhaler INHALE 2 PUFFS INTO LUNGS EVERY 6 HOURS AS NEEDED. (Patient taking differently: INHALE 2 PUFFS INTO LUNGS EVERY 6 HOURS AS NEEDED FOR SOB AND WHEEZING)                     Past Medical History:  MULTIPLE SCLEROSIS (ICD-340)........................Marland Kitchen Kayla Effie Shy [facility], was at 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  Bates  Bladder dysfunction  ...............................................Marland Kitchen  Kayla  Dyspnea/wheeze ? asthma  - HFA 50 % after coaching October 04, 2009 >  50% 06/25/2010  - PFT's October 04, 2009 FEV1 2.15 (95%) with ratio 69 and DLCO 58> corrects to 98, mild insp truncation  Health Maintenance.............................................Marland KitchenWert  - Pneumovax March 28, 2009 and Prevnar 13 11/06/2015  - Td  April 10, 2009  - CPX 11/06/2015        Objective:   Physical Exam  amb wf/ classic voice fatigue/ walks with cane/ nad    Vital signs removed - Note on arrival 02 sats  96% on RA    Wt  118 > 127 March 28, 2009 >129 June 17 > 128 October 04, 2009 > 129 06/25/2010 >125 10/29/2010 > 128 11/26/2010 >>127 12/10/2010 > 11/17/2011  129> 126 05/20/2012 > 07/29/2012  129 > 10/20/2012  129 >  135 06/21/2013 >  11/04/2013 135 >134 04/04/2014 > 04/18/2014 135 >  07/17/2014 132> 08/17/2014  135 > 12/20/2014 > 01/17/2015 134   > 08/08/2015   126 >  10/01/2015   130 > 11/06/2015   130 > 11/10/2016  123 > 02/10/2017  133       Lungs clear with   transmitted noises from upper airway - otherwise appears clear   RRR no s3 or murmur or increase in P2  Abd soft and benign with nl excursion in the supine position. No bruits or organomegaly / no cvat Ext warm hands, sym cool feet with nl pulses and  without calf tenderness, cyanosis clubbing -  sym edema and slt  purplish discoloration Skin warm and dry without lesions Neuro:  nl sensorium, walks with cane, no obvious motor deficits / does walk with hyperextension of both knees  C/w quads weak symmetrically     HEENT: nl dentition, turbinates bilaterally, and oropharynx. Nl external ear canals without cough reflex   NECK :  without JVD/Nodes/TM/  nl carotid upstrokes bilaterally   LUNGS: no acc muscle use,  Nl contour chest with upper airway transmitted noises only   CV:  RRR  no s3 or murmur or increase in P2, and trace bilateral pedal edema  ABD:  soft and nontender with nl inspiratory excursion in  the supine position. No bruits or organomegaly appreciated, bowel sounds nl  MS:  Nl gait/ ext warm without deformities, calf tenderness, cyanosis or clubbing R shoulder in sling   SKIN: warm and dry with slt purplish discoloration both feet   NEURO:  alert, approp, nl sensorium with  no motor or cerebellar deficits apparent.          Assessment & Plan:

## 2017-02-14 ENCOUNTER — Encounter: Payer: Self-pay | Admitting: Internal Medicine

## 2017-02-14 NOTE — Assessment & Plan Note (Signed)
Dyspnea/wheeze ? asthma  - HFA 50 % after coaching October 04, 2009 >  Better on dulera 100 > hfa  50% 06/25/2010  - PFT's October 04, 2009 FEV1 2.15 (95%) with ratio 69 and DLCO 58> corrects to 98, mild insp truncation  - 07/17/2014 p extensive coaching HFA effectiveness =    75%  - flared p ET 09/18/15    Mostly related to upper airways dysfunction at this point

## 2017-02-14 NOTE — Assessment & Plan Note (Signed)
-   try off neurontin 08/17/14 > did not do > rec that she discuss this with her neurologist 12/20/2014  - art/venous dopplers rec 08/10/2015 > nl arterial flows  No clinical change / controlled on lasix 20 mg daily > no change rx

## 2017-02-14 NOTE — Assessment & Plan Note (Signed)
-  HFA 50 % after coaching October 04, 2009 >  Better on dulera 100 > hfa  50% 06/25/2010  - PFT's October 04, 2009 FEV1 2.15 (95%) with ratio 69 and DLCO 58> corrects to 98, mild insp truncation  - 07/17/2014 p extensive coaching HFA effectiveness =    75%   All goals of chronic asthma control met including optimal function and elimination of symptoms with minimal need for rescue therapy.  Contingencies discussed in full including contacting this office immediately if not controlling the symptoms using the rule of two's.    

## 2017-02-14 NOTE — Assessment & Plan Note (Signed)
Lab Results  Component Value Date   TSH 1.59 08/08/2016   Adequate control on present rx, reviewed in detail with pt > no change in rx needed

## 2017-02-19 ENCOUNTER — Other Ambulatory Visit: Payer: Self-pay | Admitting: Internal Medicine

## 2017-03-08 ENCOUNTER — Other Ambulatory Visit: Payer: Self-pay | Admitting: Internal Medicine

## 2017-03-18 ENCOUNTER — Other Ambulatory Visit: Payer: Self-pay | Admitting: Internal Medicine

## 2017-05-14 ENCOUNTER — Other Ambulatory Visit: Payer: Self-pay | Admitting: *Deleted

## 2017-05-14 ENCOUNTER — Other Ambulatory Visit (INDEPENDENT_AMBULATORY_CARE_PROVIDER_SITE_OTHER): Payer: BLUE CROSS/BLUE SHIELD

## 2017-05-14 ENCOUNTER — Ambulatory Visit: Payer: BLUE CROSS/BLUE SHIELD | Admitting: Internal Medicine

## 2017-05-14 ENCOUNTER — Encounter: Payer: Self-pay | Admitting: Internal Medicine

## 2017-05-14 VITALS — BP 124/68 | HR 98 | Temp 98.7°F | Ht 63.0 in | Wt 129.8 lb

## 2017-05-14 DIAGNOSIS — E871 Hypo-osmolality and hyponatremia: Secondary | ICD-10-CM | POA: Diagnosis not present

## 2017-05-14 DIAGNOSIS — R339 Retention of urine, unspecified: Secondary | ICD-10-CM | POA: Diagnosis not present

## 2017-05-14 DIAGNOSIS — J45991 Cough variant asthma: Secondary | ICD-10-CM | POA: Diagnosis not present

## 2017-05-14 DIAGNOSIS — E038 Other specified hypothyroidism: Secondary | ICD-10-CM | POA: Diagnosis not present

## 2017-05-14 LAB — CBC WITH DIFFERENTIAL/PLATELET
Basophils Absolute: 0.1 10*3/uL (ref 0.0–0.1)
Basophils Relative: 0.8 % (ref 0.0–3.0)
Eosinophils Absolute: 0 10*3/uL (ref 0.0–0.7)
Eosinophils Relative: 0.7 % (ref 0.0–5.0)
HCT: 34.7 % — ABNORMAL LOW (ref 36.0–46.0)
Hemoglobin: 11.7 g/dL — ABNORMAL LOW (ref 12.0–15.0)
Lymphocytes Relative: 17.5 % (ref 12.0–46.0)
Lymphs Abs: 1.1 10*3/uL (ref 0.7–4.0)
MCHC: 33.6 g/dL (ref 30.0–36.0)
MCV: 88.4 fl (ref 78.0–100.0)
Monocytes Absolute: 1.1 10*3/uL — ABNORMAL HIGH (ref 0.1–1.0)
Monocytes Relative: 18 % — ABNORMAL HIGH (ref 3.0–12.0)
Neutro Abs: 4 10*3/uL (ref 1.4–7.7)
Neutrophils Relative %: 63 % (ref 43.0–77.0)
Platelets: 319 10*3/uL (ref 150.0–400.0)
RBC: 3.93 Mil/uL (ref 3.87–5.11)
RDW: 14.1 % (ref 11.5–15.5)
WBC: 6.3 10*3/uL (ref 4.0–10.5)

## 2017-05-14 LAB — BASIC METABOLIC PANEL
BUN: 13 mg/dL (ref 6–23)
CO2: 29 mEq/L (ref 19–32)
Calcium: 9.2 mg/dL (ref 8.4–10.5)
Chloride: 97 mEq/L (ref 96–112)
Creatinine, Ser: 0.91 mg/dL (ref 0.40–1.20)
GFR: 65.98 mL/min (ref >60.00)
Glucose, Bld: 101 mg/dL — ABNORMAL HIGH (ref 70–99)
Potassium: 3.8 mEq/L (ref 3.5–5.1)
Sodium: 135 mEq/L (ref 135–145)

## 2017-05-14 LAB — TSH: TSH: 0.82 u[IU]/mL (ref 0.35–4.50)

## 2017-05-14 LAB — URINALYSIS, ROUTINE W REFLEX MICROSCOPIC
Bilirubin Urine: NEGATIVE
Ketones, ur: NEGATIVE
Nitrite: NEGATIVE
Specific Gravity, Urine: 1.01 (ref 1.000–1.030)
Total Protein, Urine: 30 — AB
Urine Glucose: NEGATIVE
Urobilinogen, UA: 0.2 (ref 0.0–1.0)
pH: 6.5 (ref 5.0–8.0)

## 2017-05-14 MED ORDER — CIPROFLOXACIN HCL 500 MG PO TABS: 500.0000 mg | ORAL_TABLET | Freq: Two times a day (BID) | ORAL | 0 refills | Status: DC

## 2017-05-14 MED ORDER — PREDNISONE 10 MG PO TABS: ORAL_TABLET | ORAL | 0 refills | Status: DC

## 2017-05-14 MED ORDER — FUROSEMIDE 20 MG PO TABS: 20.0000 mg | ORAL_TABLET | Freq: Every day | ORAL | 11 refills | Status: DC

## 2017-05-14 MED ORDER — AZITHROMYCIN 250 MG PO TABS: ORAL_TABLET | ORAL | 0 refills | Status: DC

## 2017-05-14 NOTE — Progress Notes (Signed)
Rx sent to pt's preferred pharmacy of cipro 500 for her to take bid for 5 days to help treat a UTI per MW.  Nothing further needed.

## 2017-05-14 NOTE — Progress Notes (Signed)
Subjective:    Patient ID: Kayla [name], female    DOB: Dec 15, 1950   MRN: [id number]   Brief patient profile:  18 yowf never smoker with MS since Oct 96 all neuro care through Dr [name] in [facility] ([facility]),   [name] at [facility] urology,  [facility]  Followed here for Primary care/hypothyroid     History of Present Illness  October 04, 2009 Followup with PFT's. Pt states that her breathing is no better since last seen June 2011 for acute visit. She states she is still wheezing- ventolin helps and is using this up to tid. w/u c/w anemia  Mild dysphagia no more than usual. occ waking up wheezing at night despite zegerid at hs.   rec  Try  use dulera 100 2 bid prn   September 18 2015  R THR [name]    10/01/2015  f/u ov/Kayla [name] re: sob s/p et on dulera 100 2bid and dexilant q am  Chief Complaint  Patient presents with  . Acute Visit    Pt c/o increased DOE since had hip replacement 09/18/15. She states she gets SOB with just talking, showering or walking short distances.   has cpap machine but not using at all  - prev eval and rx by Dr [name]  rec Add pepcid 20 mg x one at bedtime Prednisone 10 mg take  4 each am x 2 days,   2 each am x 2 days,  1 each am x 2 days and stop  Start back on cpap See Dr [name] asap   11/06/2015  f/u ov/Kayla [name] re:  cpx  Chief Complaint  Patient presents with  . Follow-up    cpx- pt states she is doing well today, no complaints.     breathing better at night on cpap per Dr [name]  Doe = wm shopping / was not limited at [employer] prior to hip surgery but has not resumed saba once a week / no cough  rec Prevnar 13 today Please remember to go to the lab  department downstairs for your tests - we will call you with the results when they are available. 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 Follow-up    Doing well and denies any co's today.   back in CPAP as of July 2017  > no change in symptoms but wakes her up a lot / f/u per Dr [name] planned  rec See Dr [name] for all issues related to your cpap machine but it appears to be working fine by your download today  No change in medications and recs     08/08/2016  f/u ov/Kayla [name] re: multiple concerns 1) check thyroid - clinically no symptoms 2) feet stay cold/ blue with neg vascular w/u to date 3) wants referral to sports medicine as needs stay hyperextended due to MS/ no problem with falling just using cane Rec Please see patient coordinator before you leave today  to schedule referal to sports medicine and vascular surgery Please remember to go to the lab department downstairs in the basement  for your tests - we will call you with the results when they are available. Please schedule a follow up visit in 3 months but call sooner if needed  11/10/2016  f/u ov/Kayla [name] re: hyponatremia  Chief Complaint  Patient presents with  . Follow-up    recent visit to ED- 11/07/16 for hyponatremia- sodium level was 123.   Bladder dysfunction / foley dep since 11/04/16 > f/u McDermodt Knee hyperext better with support  Hyponatremia/ no symptoms on  Lasix daily  cpap d/c 'd 09/03/2016 with RLS not bothersome to her > neurology f/u  Asthma vs vcd    dulera 100 2bid / hfa with some sense of globus but no cough or increased sob/ noct symptoms or need for saba  rec Plan A = Automatic = dulera 100 2 pffs each am and then only take the pm the dose if you are not doing great and/or needing your rescue inhaler  Work on inhaler technique:      Plan B = Backup Only use your albuterol (ventolin) as a rescue medication   Drink gatorade if you need to push more fluids for any reason      05/14/2017  f/u ov/Kayla [name] re: asthma/ vcd f/u hyponatremia/ urine cloudy  Chief Complaint  Patient presents with  . Follow-up     Cough and sore throat- onseet 1 day ago. She coughing up some white sputum.     Dyspnea:  More sob x sev days assoc cough/ st p exp to sick husband  Cough: white mucus, worse in am > flutter but not using  Sleep: now flat   Take one lasix daily and 1-2 x weekly takes extra prn    No obvious day to day or daytime variability or assoc   purulent sputum or mucus plugs or hemoptysis or cp or chest tightness, subjective wheeze or overt sinus or hb symptoms. No unusual exposure hx or h/o childhood pna/ asthma or knowledge of premature birth.  Sleeping ok flat without nocturnal  or early am exacerbation  of respiratory  c/o's or need for noct saba. Also denies any obvious fluctuation of symptoms with weather or environmental changes or other aggravating or alleviating factors except as outlined above   Current Allergies, Complete Past Medical History, Past Surgical History, Family History, and Social History were reviewed in [employer] record.  ROS  The following are not active complaints unless bolded Hoarseness, sore throat, dysphagia, dental problems, itching, sneezing,  nasal congestion or discharge of excess mucus or purulent secretions, ear ache,   fever, chills, sweats, unintended wt loss or wt gain, classically pleuritic or exertional cp,  orthopnea pnd or leg swelling, presyncope, palpitations, abdominal pain, anorexia, nausea, vomiting, diarrhea  or change in bowel habits or change in bladder habits, change in stools or change in urine, dysuria, hematuria,  rash, arthralgias, visual complaints, headache, numbness, weakness or ataxia or problems with walking or coordination,  change in mood/affect or memory.        Current Meds  Medication Sig  . acetaminophen (TYLENOL 8 HOUR) 650 MG CR tablet Take 1 tablet (650 mg total) by mouth every 6 (six) hours.  Marland Kitchen amphetamine-dextroamphetamine (ADDERALL) 20 MG tablet Take 20 mg by mouth 2 (two) times daily.   . baclofen  (LIORESAL) 20 MG tablet Take 20 mg by mouth 3 (three) times daily.   . Calcium Carbonate-Vitamin D (CALCIUM 600+D) 600-400 MG-UNIT tablet Take 1 tablet by mouth daily.  . cholecalciferol (VITAMIN D) 1000 UNITS tablet Take 2,000 Units by mouth daily.   Marland Kitchen DEXILANT 60 MG capsule TAKE 1 CAPSULE ONCE DAILY WITH BREAKFAST.  . DULERA 100-5 MCG/ACT AERO INHALE 2  PUFFS IN THE AM AND 2 PUFFS IN THE PM ABOUT 12 HOURS APART.  Marland Kitchen FLUoxetine (PROZAC) 40 MG capsule Take 40 mg by mouth daily.    . furosemide (LASIX) 20 MG tablet Take 1 tablet (20 mg total) by mouth daily.  Marland Kitchen gabapentin (NEURONTIN) 600 MG tablet Take 600 mg by mouth 2 (two) times daily.  . IBU 800 MG tablet Take 1 tablet by mouth 3 (three) times daily.  Marland Kitchen levothyroxine (SYNTHROID, LEVOTHROID) 100 MCG tablet TAKE 1 TABLET ONCE DAILY BEFORE BREAKFAST.  Marland Kitchen Misc Natural Products (TART CHERRY ADVANCED PO) Take 1 capsule by mouth at bedtime.   . Multiple Vitamin (MULTIVITAMIN WITH MINERALS) TABS tablet Take 1 tablet by mouth daily.  Marland Kitchen ocrelizumab (OCREVUS) 300 MG/10ML injection Inject 300 mg into the vein every 6 (six) months.   . temazepam (RESTORIL) 15 MG capsule Take 15 mg by mouth at bedtime.   . TURMERIC PO Take 500 mg by mouth 2 (two) times daily.   . VENTOLIN HFA 108 (90 Base) MCG/ACT inhaler INHALE 2 PUFFS INTO LUNGS EVERY 6 HOURS AS NEEDED. (Patient taking differently: INHALE 2 PUFFS INTO LUNGS EVERY 6 HOURS AS NEEDED FOR SOB AND WHEEZING)  .   furosemide (LASIX) 20 MG tablet Take 1 tablet (20 mg total) by mouth daily.               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  Bates  Bladder dysfunction ...............................................Marland Kitchen  McDermodt  Dyspnea/wheeze ? asthma  - HFA 50 % after coaching October 04, 2009 >  50% 06/25/2010  - PFT's October 04, 2009 FEV1 2.15 (95%) with ratio 69 and DLCO 58> corrects to 98, mild insp truncation  Health Maintenance.............................................Marland KitchenWert  - Pneumovax March 28, 2009 and Prevnar 13 11/06/2015  - Td  April 10, 2009  - CPX 11/06/2015        Objective:   Physical Exam  amb wf nad / pos voice fatigue with stridorous insp (baseline)   Vital signs reviewed - Note on arrival 02 sats  100% on RA    Wt  118 > 127 March 28, 2009 >129 June 17 > 128 October 04, 2009 > 129 06/25/2010 >125 10/29/2010 > 128 11/26/2010 >>127 12/10/2010 > 11/17/2011  129> 126 05/20/2012 > 07/29/2012  129 > 10/20/2012  129 >  135 06/21/2013 >  11/04/2013 135 >134 04/04/2014 > 04/18/2014 135 >  07/17/2014 132> 08/17/2014  135 > 12/20/2014 > 01/17/2015 134   > 08/08/2015   126 >  10/01/2015   130 > 11/06/2015   130 > 11/10/2016  123 > 02/10/2017  133  > 05/14/2017   129      HEENT: nl dentition, turbinates bilaterally, and oropharynx. Nl external ear canals without cough reflex   NECK :  without JVD/Nodes/TM/ nl carotid upstrokes bilaterally   LUNGS: no acc muscle use,  Nl contour chest / clear to A and P though lots of transmitted upper airway noises    CV:  RRR  no s3 or murmur or increase in P2, and no significant pedal  edema   ABD:  soft and nontender with nl inspiratory excursion in the supine position. No bruits  or organomegaly appreciated, bowel sounds nl  MS:  Nl gait/ ext warm without deformities, calf tenderness, cyanosis or clubbing No obvious joint restrictions   SKIN: warm and dry with purplish discoloration feet (baseline)   NEURO:  alert, approp, nl sensorium with  no motor or cerebellar deficits apparent.     Labs ordered/ reviewed:      Chemistry      Component  Value Date/Time   NA 135 05/14/2017 0956   K 3.8 05/14/2017 0956   CL 97 05/14/2017 0956   CO2 29 05/14/2017 0956   BUN 13 05/14/2017 0956   CREATININE 0.91 05/14/2017 0956      Component Value Date/Time   CALCIUM 9.2 05/14/2017 0956   ALKPHOS 66 11/10/2015 1448   AST 49 (H) 11/10/2015 1448   ALT 12 (L) 11/10/2015 1448   BILITOT 1.0 11/10/2015 1448        Lab Results  Component Value Date   WBC 6.3 05/14/2017   HGB 11.7 (L) 05/14/2017   HCT 34.7 (L) 05/14/2017   MCV 88.4 05/14/2017   PLT 319.0 05/14/2017         Lab Results  Component Value Date   TSH 0.82 05/14/2017           Labs ordered 05/14/2017  U/a  Pos UTI            Assessment & Plan:

## 2017-05-14 NOTE — Patient Instructions (Addendum)
Zpak and Prednisone 10 mg take  4 each am x 2 days,   2 each am x 2 days,  1 each am x 2 days and stop   For cough > mucinex dm up to 1200 mg every 12 hours as needed with flutter valve as much as possible   Please remember to go to the lab department downstairs in the basement  for your tests - we will call you with the results when they are available.      Please schedule a follow up visit in 3 months but call sooner if needed

## 2017-05-17 ENCOUNTER — Encounter: Payer: Self-pay | Admitting: Internal Medicine

## 2017-05-17 NOTE — Assessment & Plan Note (Signed)
Adequate control on present rx, reviewed in detail with pt > no change in rx needed   

## 2017-05-17 NOTE — Assessment & Plan Note (Addendum)
Pos uti now > rx cipro 500 bid x 5 days/ urology f/u prn   Each maintenance medication was reviewed in detail including most importantly the difference between maintenance and as needed and under what circumstances the prns are to be used.  Please see AVS for specific  Instructions which are unique to this visit and I personally typed out  which were reviewed in detail in writing with the patient and a copy provided.

## 2017-05-17 NOTE — Assessment & Plan Note (Signed)
11/10/2016  After extensive coaching HFA effectiveness =    75% from a baseline of 50%  - 11/10/2016 rec try just taking 2 pffs each am since having sense of globus on 2bid with baseline poor technique  - flutter valve  05/14/2017 > instructions given.   Mild flare in setting up uri with difficulty with secretions > try flutter / mucinex  Zpak/ pred x 6 days/ no change maint rx

## 2017-05-17 NOTE — Assessment & Plan Note (Addendum)
10/2011 132 >127 05/17/12 >130 05/20/2012 >fluid restrictions> na 132 10/20/12  - d/c'd hctz with na 126 on 08/17/14 > rx lasix > resolved 12/20/2014  - recurred  11/07/16 in setting of pushing fluids for ? uti that turned out to be bladder dysfunction ? Related to MS or MS meds   Adequate control on present rx, reviewed in detail with pt > no change in rx needed

## 2017-06-02 ENCOUNTER — Other Ambulatory Visit: Payer: Self-pay | Admitting: Internal Medicine

## 2017-06-02 MED ORDER — FLUTTER DEVI: 0 refills | Status: AC

## 2017-06-04 ENCOUNTER — Ambulatory Visit (HOSPITAL_BASED_OUTPATIENT_CLINIC_OR_DEPARTMENT_OTHER)
Admission: RE | Admit: 2017-06-04 | Discharge: 2017-06-04 | Disposition: A | Discharge status: Home / Self Care | Payer: BLUE CROSS/BLUE SHIELD | Source: Ambulatory Visit | Attending: Cardiovascular Disease | Admitting: Cardiovascular Disease

## 2017-06-04 ENCOUNTER — Other Ambulatory Visit: Payer: Self-pay | Admitting: Orthopedic Surgery

## 2017-06-04 DIAGNOSIS — M7989 Other specified soft tissue disorders: Secondary | ICD-10-CM | POA: Diagnosis not present

## 2017-06-04 DIAGNOSIS — M25473 Effusion, unspecified ankle: Secondary | ICD-10-CM | POA: Insufficient documentation

## 2017-06-04 DIAGNOSIS — M79661 Pain in right lower leg: Secondary | ICD-10-CM | POA: Diagnosis not present

## 2017-06-05 ENCOUNTER — Inpatient Hospital Stay (HOSPITAL_COMMUNITY)
Admission: EM | Admit: 2017-06-05 | Discharge: 2017-06-09 | DRG: 467 | Disposition: A | Discharge status: Home Health | Payer: BLUE CROSS/BLUE SHIELD | Attending: Orthopedic Surgery | Admitting: Orthopedic Surgery

## 2017-06-05 ENCOUNTER — Encounter (HOSPITAL_COMMUNITY)
Admission: EM | Disposition: A | Discharge status: Home Health | Payer: Self-pay | Source: Home / Self Care | Attending: Orthopedic Surgery

## 2017-06-05 ENCOUNTER — Inpatient Hospital Stay (HOSPITAL_COMMUNITY): Payer: BLUE CROSS/BLUE SHIELD

## 2017-06-05 ENCOUNTER — Emergency Department (HOSPITAL_COMMUNITY): Payer: BLUE CROSS/BLUE SHIELD

## 2017-06-05 ENCOUNTER — Encounter (HOSPITAL_COMMUNITY): Payer: Self-pay | Admitting: Internal Medicine

## 2017-06-05 ENCOUNTER — Emergency Department (HOSPITAL_COMMUNITY): Payer: BLUE CROSS/BLUE SHIELD | Admitting: Certified Registered Nurse Anesthetist

## 2017-06-05 DIAGNOSIS — Z7951 Long term (current) use of inhaled steroids: Secondary | ICD-10-CM | POA: Diagnosis not present

## 2017-06-05 DIAGNOSIS — J383 Other diseases of vocal cords: Secondary | ICD-10-CM | POA: Diagnosis present

## 2017-06-05 DIAGNOSIS — T84028A Dislocation of other internal joint prosthesis, initial encounter: Secondary | ICD-10-CM

## 2017-06-05 DIAGNOSIS — D62 Acute posthemorrhagic anemia: Secondary | ICD-10-CM | POA: Diagnosis not present

## 2017-06-05 DIAGNOSIS — F329 Major depressive disorder, single episode, unspecified: Secondary | ICD-10-CM | POA: Diagnosis present

## 2017-06-05 DIAGNOSIS — D638 Anemia in other chronic diseases classified elsewhere: Secondary | ICD-10-CM | POA: Diagnosis present

## 2017-06-05 DIAGNOSIS — Z419 Encounter for procedure for purposes other than remedying health state, unspecified: Secondary | ICD-10-CM | POA: Diagnosis not present

## 2017-06-05 DIAGNOSIS — Z8541 Personal history of malignant neoplasm of cervix uteri: Secondary | ICD-10-CM

## 2017-06-05 DIAGNOSIS — M25351 Other instability, right hip: Secondary | ICD-10-CM | POA: Diagnosis present

## 2017-06-05 DIAGNOSIS — M25571 Pain in right ankle and joints of right foot: Secondary | ICD-10-CM | POA: Diagnosis not present

## 2017-06-05 DIAGNOSIS — Z888 Allergy status to other drugs, medicaments and biological substances status: Secondary | ICD-10-CM

## 2017-06-05 DIAGNOSIS — Z885 Allergy status to narcotic agent status: Secondary | ICD-10-CM

## 2017-06-05 DIAGNOSIS — J449 Chronic obstructive pulmonary disease, unspecified: Secondary | ICD-10-CM | POA: Diagnosis present

## 2017-06-05 DIAGNOSIS — K219 Gastro-esophageal reflux disease without esophagitis: Secondary | ICD-10-CM | POA: Diagnosis present

## 2017-06-05 DIAGNOSIS — T84020A Dislocation of internal right hip prosthesis, initial encounter: Secondary | ICD-10-CM | POA: Diagnosis present

## 2017-06-05 DIAGNOSIS — T84028D Dislocation of other internal joint prosthesis, subsequent encounter: Secondary | ICD-10-CM | POA: Diagnosis not present

## 2017-06-05 DIAGNOSIS — G35 Multiple sclerosis: Secondary | ICD-10-CM | POA: Diagnosis present

## 2017-06-05 DIAGNOSIS — E039 Hypothyroidism, unspecified: Secondary | ICD-10-CM | POA: Diagnosis present

## 2017-06-05 DIAGNOSIS — F909 Attention-deficit hyperactivity disorder, unspecified type: Secondary | ICD-10-CM | POA: Diagnosis present

## 2017-06-05 DIAGNOSIS — S73004A Unspecified dislocation of right hip, initial encounter: Secondary | ICD-10-CM | POA: Diagnosis not present

## 2017-06-05 DIAGNOSIS — E038 Other specified hypothyroidism: Secondary | ICD-10-CM | POA: Diagnosis not present

## 2017-06-05 DIAGNOSIS — Z79899 Other long term (current) drug therapy: Secondary | ICD-10-CM

## 2017-06-05 DIAGNOSIS — E860 Dehydration: Secondary | ICD-10-CM | POA: Diagnosis present

## 2017-06-05 DIAGNOSIS — Z96649 Presence of unspecified artificial hip joint: Secondary | ICD-10-CM

## 2017-06-05 DIAGNOSIS — M810 Age-related osteoporosis without current pathological fracture: Secondary | ICD-10-CM | POA: Diagnosis present

## 2017-06-05 DIAGNOSIS — G4730 Sleep apnea, unspecified: Secondary | ICD-10-CM | POA: Diagnosis present

## 2017-06-05 DIAGNOSIS — F419 Anxiety disorder, unspecified: Secondary | ICD-10-CM | POA: Diagnosis present

## 2017-06-05 DIAGNOSIS — Y792 Prosthetic and other implants, materials and accessory orthopedic devices associated with adverse incidents: Secondary | ICD-10-CM | POA: Diagnosis present

## 2017-06-05 DIAGNOSIS — I959 Hypotension, unspecified: Secondary | ICD-10-CM | POA: Diagnosis not present

## 2017-06-05 DIAGNOSIS — G35D Multiple sclerosis, unspecified: Secondary | ICD-10-CM | POA: Diagnosis present

## 2017-06-05 HISTORY — PX: HIP CLOSED REDUCTION: SHX983

## 2017-06-05 LAB — COMPREHENSIVE METABOLIC PANEL
ALT: 11 U/L — ABNORMAL LOW (ref 14–54)
AST: 23 U/L (ref 15–41)
Albumin: 3.7 g/dL (ref 3.5–5.0)
Alkaline Phosphatase: 50 U/L (ref 38–126)
Anion gap: 10 (ref 5–15)
BUN: 18 mg/dL (ref 6–20)
CO2: 25 mmol/L (ref 22–32)
Calcium: 8.4 mg/dL — ABNORMAL LOW (ref 8.9–10.3)
Chloride: 102 mmol/L (ref 101–111)
Creatinine, Ser: 0.97 mg/dL (ref 0.44–1.00)
GFR calc Af Amer: >60 mL/min (ref >60)
GFR calc non Af Amer: >60 mL/min (ref >60)
Glucose, Bld: 107 mg/dL — ABNORMAL HIGH (ref 65–99)
Potassium: 4.1 mmol/L (ref 3.5–5.1)
Sodium: 137 mmol/L (ref 135–145)
Total Bilirubin: 0.7 mg/dL (ref 0.3–1.2)
Total Protein: 6 g/dL — ABNORMAL LOW (ref 6.5–8.1)

## 2017-06-05 LAB — CBC WITH DIFFERENTIAL/PLATELET
Basophils Absolute: 0 10*3/uL (ref 0.0–0.1)
Basophils Relative: 0 %
Eosinophils Absolute: 0.1 10*3/uL (ref 0.0–0.7)
Eosinophils Relative: 2 %
HCT: 32.9 % — ABNORMAL LOW (ref 36.0–46.0)
Hemoglobin: 10.7 g/dL — ABNORMAL LOW (ref 12.0–15.0)
Lymphocytes Relative: 15 %
Lymphs Abs: 0.9 10*3/uL (ref 0.7–4.0)
MCH: 29.2 pg (ref 26.0–34.0)
MCHC: 32.5 g/dL (ref 30.0–36.0)
MCV: 89.6 fL (ref 78.0–100.0)
Monocytes Absolute: 0.6 10*3/uL (ref 0.1–1.0)
Monocytes Relative: 9 %
Neutro Abs: 4.7 10*3/uL (ref 1.7–7.7)
Neutrophils Relative %: 74 %
Platelets: 343 10*3/uL (ref 150–400)
RBC: 3.67 MIL/uL — ABNORMAL LOW (ref 3.87–5.11)
RDW: 14.5 % (ref 11.5–15.5)
WBC: 6.4 10*3/uL (ref 4.0–10.5)

## 2017-06-05 SURGERY — CLOSED REDUCTION, HIP
Anesthesia: General | Site: Hip | Laterality: Right

## 2017-06-05 MED ORDER — PANTOPRAZOLE SODIUM 40 MG PO TBEC
40.0000 mg | DELAYED_RELEASE_TABLET | Freq: Every day | ORAL | Status: DC
  Administered 2017-06-07 – 2017-06-09 (×3): 40 mg via ORAL
  Filled 2017-06-05 (×3): qty 1

## 2017-06-05 MED ORDER — ACETAMINOPHEN 10 MG/ML IV SOLN: 1000.0000 mg | Freq: Once | INTRAVENOUS | Status: DC | PRN

## 2017-06-05 MED ORDER — EPHEDRINE SULFATE-NACL 50-0.9 MG/10ML-% IV SOSY
PREFILLED_SYRINGE | INTRAVENOUS | Status: DC | PRN
  Administered 2017-06-05: 5 mg via INTRAVENOUS
  Administered 2017-06-05: 10 mg via INTRAVENOUS
  Administered 2017-06-05: 2.5 mg via INTRAVENOUS

## 2017-06-05 MED ORDER — DOCUSATE SODIUM 100 MG PO CAPS
100.0000 mg | ORAL_CAPSULE | Freq: Two times a day (BID) | ORAL | Status: DC
  Administered 2017-06-06 – 2017-06-09 (×7): 100 mg via ORAL
  Filled 2017-06-05 (×7): qty 1

## 2017-06-05 MED ORDER — FLUOXETINE HCL 20 MG PO CAPS
40.0000 mg | ORAL_CAPSULE | Freq: Every day | ORAL | Status: DC
  Administered 2017-06-07 – 2017-06-09 (×3): 40 mg via ORAL
  Filled 2017-06-05 (×4): qty 2

## 2017-06-05 MED ORDER — POVIDONE-IODINE 10 % EX SWAB: 2.0000 "application " | Freq: Once | CUTANEOUS | Status: DC

## 2017-06-05 MED ORDER — TEMAZEPAM 15 MG PO CAPS
15.0000 mg | ORAL_CAPSULE | Freq: Every day | ORAL | Status: DC
  Administered 2017-06-05 – 2017-06-08 (×4): 15 mg via ORAL
  Filled 2017-06-05 (×4): qty 1

## 2017-06-05 MED ORDER — LACTATED RINGERS IV SOLN
INTRAVENOUS | Status: DC
  Administered 2017-06-05: 19:00:00 via INTRAVENOUS

## 2017-06-05 MED ORDER — ONDANSETRON HCL 4 MG/2ML IJ SOLN: 4.0000 mg | Freq: Four times a day (QID) | INTRAMUSCULAR | Status: DC | PRN

## 2017-06-05 MED ORDER — ROCURONIUM BROMIDE 100 MG/10ML IV SOLN
INTRAVENOUS | Status: DC | PRN
  Administered 2017-06-05: 5 mg via INTRAVENOUS

## 2017-06-05 MED ORDER — VITAMIN D3 25 MCG (1000 UNIT) PO TABS: 2000.0000 [IU] | ORAL_TABLET | Freq: Every day | ORAL | Status: DC

## 2017-06-05 MED ORDER — SODIUM CHLORIDE 0.9 % IV SOLN
INTRAVENOUS | Status: DC
  Administered 2017-06-05: 23:00:00 via INTRAVENOUS

## 2017-06-05 MED ORDER — ONDANSETRON HCL 4 MG PO TABS: 4.0000 mg | ORAL_TABLET | Freq: Four times a day (QID) | ORAL | Status: DC | PRN

## 2017-06-05 MED ORDER — CEFAZOLIN SODIUM-DEXTROSE 2-4 GM/100ML-% IV SOLN: 2.0000 g | INTRAVENOUS | Status: DC

## 2017-06-05 MED ORDER — OCRELIZUMAB 300 MG/10ML IV SOLN: 300.0000 mg | INTRAVENOUS | Status: DC

## 2017-06-05 MED ORDER — ONDANSETRON HCL 4 MG PO TABS
4.0000 mg | ORAL_TABLET | Freq: Four times a day (QID) | ORAL | Status: DC | PRN
  Filled 2017-06-05: qty 1

## 2017-06-05 MED ORDER — AMPHETAMINE-DEXTROAMPHETAMINE 20 MG PO TABS
20.0000 mg | ORAL_TABLET | Freq: Two times a day (BID) | ORAL | Status: DC
  Administered 2017-06-06 – 2017-06-09 (×5): 20 mg via ORAL
  Filled 2017-06-05 (×5): qty 1

## 2017-06-05 MED ORDER — FENTANYL CITRATE (PF) 100 MCG/2ML IJ SOLN
INTRAMUSCULAR | Status: AC
  Filled 2017-06-05: qty 2

## 2017-06-05 MED ORDER — CHLORHEXIDINE GLUCONATE 4 % EX LIQD: 60.0000 mL | Freq: Once | CUTANEOUS | Status: DC

## 2017-06-05 MED ORDER — SODIUM CHLORIDE 0.9% FLUSH: 3.0000 mL | INTRAVENOUS | Status: DC | PRN

## 2017-06-05 MED ORDER — PREDNISONE 5 MG PO TABS
10.0000 mg | ORAL_TABLET | Freq: Every day | ORAL | Status: DC
  Filled 2017-06-05: qty 2

## 2017-06-05 MED ORDER — FUROSEMIDE 20 MG PO TABS: 20.0000 mg | ORAL_TABLET | Freq: Every day | ORAL | Status: DC

## 2017-06-05 MED ORDER — FENTANYL CITRATE (PF) 100 MCG/2ML IJ SOLN
100.0000 ug | Freq: Once | INTRAMUSCULAR | Status: AC
  Administered 2017-06-05: 100 ug via INTRAVENOUS
  Filled 2017-06-05: qty 2

## 2017-06-05 MED ORDER — HYDROMORPHONE HCL 2 MG PO TABS
2.0000 mg | ORAL_TABLET | ORAL | Status: DC | PRN
  Administered 2017-06-05 – 2017-06-08 (×6): 2 mg via ORAL
  Filled 2017-06-05 (×7): qty 1

## 2017-06-05 MED ORDER — MENTHOL 3 MG MT LOZG
1.0000 | LOZENGE | OROMUCOSAL | Status: DC | PRN
Start: 2017-06-05 — End: 2017-06-09
  Filled 2017-06-05: qty 9

## 2017-06-05 MED ORDER — FLEET ENEMA 7-19 GM/118ML RE ENEM
1.0000 | ENEMA | Freq: Once | RECTAL | Status: DC | PRN
  Filled 2017-06-05: qty 1

## 2017-06-05 MED ORDER — AZITHROMYCIN 250 MG PO TABS
500.0000 mg | ORAL_TABLET | Freq: Once | ORAL | Status: AC
  Administered 2017-06-05: 500 mg via ORAL
  Filled 2017-06-05: qty 2

## 2017-06-05 MED ORDER — FENTANYL CITRATE (PF) 100 MCG/2ML IJ SOLN: 25.0000 ug | INTRAMUSCULAR | Status: DC | PRN

## 2017-06-05 MED ORDER — PROPOFOL 10 MG/ML IV BOLUS
INTRAVENOUS | Status: DC | PRN
  Administered 2017-06-05: 100 mg via INTRAVENOUS
  Administered 2017-06-05: 50 mg via INTRAVENOUS

## 2017-06-05 MED ORDER — BISACODYL 10 MG RE SUPP
10.0000 mg | Freq: Every day | RECTAL | Status: DC | PRN
  Filled 2017-06-05: qty 1

## 2017-06-05 MED ORDER — SODIUM CHLORIDE 0.9% FLUSH
3.0000 mL | Freq: Two times a day (BID) | INTRAVENOUS | Status: DC
  Administered 2017-06-06 – 2017-06-09 (×5): 3 mL via INTRAVENOUS

## 2017-06-05 MED ORDER — PROPOFOL 10 MG/ML IV BOLUS
INTRAVENOUS | Status: AC
Start: 2017-06-05 — End: 2017-06-05
  Filled 2017-06-05: qty 20

## 2017-06-05 MED ORDER — SODIUM CHLORIDE 0.9 % IV SOLN
250.0000 mL | INTRAVENOUS | Status: DC | PRN
  Administered 2017-06-06: 13:00:00 via INTRAVENOUS
  Administered 2017-06-07: 1000 mL via INTRAVENOUS

## 2017-06-05 MED ORDER — POLYETHYLENE GLYCOL 3350 17 G PO PACK
17.0000 g | PACK | Freq: Every day | ORAL | Status: DC | PRN
  Filled 2017-06-05: qty 1

## 2017-06-05 MED ORDER — ONDANSETRON HCL 4 MG/2ML IJ SOLN
4.0000 mg | Freq: Four times a day (QID) | INTRAMUSCULAR | Status: DC | PRN
  Administered 2017-06-06: 4 mg via INTRAVENOUS

## 2017-06-05 MED ORDER — GABAPENTIN 300 MG PO CAPS
600.0000 mg | ORAL_CAPSULE | Freq: Two times a day (BID) | ORAL | Status: DC
  Administered 2017-06-06 – 2017-06-09 (×7): 600 mg via ORAL
  Filled 2017-06-05 (×7): qty 2

## 2017-06-05 MED ORDER — PROMETHAZINE HCL 25 MG/ML IJ SOLN: 6.2500 mg | INTRAMUSCULAR | Status: DC | PRN

## 2017-06-05 MED ORDER — ACETAMINOPHEN 325 MG PO TABS
650.0000 mg | ORAL_TABLET | Freq: Four times a day (QID) | ORAL | Status: DC
  Administered 2017-06-05 – 2017-06-09 (×13): 650 mg via ORAL
  Filled 2017-06-05 (×13): qty 2

## 2017-06-05 MED ORDER — DOCUSATE SODIUM 100 MG PO CAPS
100.0000 mg | ORAL_CAPSULE | Freq: Two times a day (BID) | ORAL | Status: DC
  Filled 2017-06-05: qty 1

## 2017-06-05 MED ORDER — CALCIUM CARBONATE-VITAMIN D 500-200 MG-UNIT PO TABS: 1.0000 | ORAL_TABLET | Freq: Every day | ORAL | Status: DC

## 2017-06-05 MED ORDER — ONDANSETRON HCL 4 MG/2ML IJ SOLN
INTRAMUSCULAR | Status: DC | PRN
  Administered 2017-06-05: 4 mg via INTRAVENOUS

## 2017-06-05 MED ORDER — PHENOL 1.4 % MT LIQD
1.0000 | OROMUCOSAL | Status: DC | PRN
  Filled 2017-06-05: qty 177

## 2017-06-05 MED ORDER — METOCLOPRAMIDE HCL 5 MG/ML IJ SOLN: 5.0000 mg | Freq: Three times a day (TID) | INTRAMUSCULAR | Status: DC | PRN

## 2017-06-05 MED ORDER — SUGAMMADEX SODIUM 200 MG/2ML IV SOLN
INTRAVENOUS | Status: DC | PRN
  Administered 2017-06-05: 120 mg via INTRAVENOUS

## 2017-06-05 MED ORDER — ALUM & MAG HYDROXIDE-SIMETH 200-200-20 MG/5ML PO SUSP: 30.0000 mL | ORAL | Status: DC | PRN

## 2017-06-05 MED ORDER — BACLOFEN 20 MG PO TABS
20.0000 mg | ORAL_TABLET | Freq: Three times a day (TID) | ORAL | Status: DC
  Administered 2017-06-06 – 2017-06-09 (×9): 20 mg via ORAL
  Filled 2017-06-05 (×10): qty 1

## 2017-06-05 MED ORDER — FENTANYL CITRATE (PF) 100 MCG/2ML IJ SOLN
INTRAMUSCULAR | Status: DC | PRN
  Administered 2017-06-05: 50 ug via INTRAVENOUS

## 2017-06-05 MED ORDER — METOCLOPRAMIDE HCL 5 MG PO TABS: 5.0000 mg | ORAL_TABLET | Freq: Three times a day (TID) | ORAL | Status: DC | PRN

## 2017-06-05 MED ORDER — ADULT MULTIVITAMIN W/MINERALS CH
1.0000 | ORAL_TABLET | Freq: Every day | ORAL | Status: DC
  Administered 2017-06-07 – 2017-06-09 (×3): 1 via ORAL
  Filled 2017-06-05 (×3): qty 1

## 2017-06-05 MED ORDER — FERROUS SULFATE 325 (65 FE) MG PO TABS
325.0000 mg | ORAL_TABLET | Freq: Three times a day (TID) | ORAL | Status: DC
  Administered 2017-06-06 – 2017-06-09 (×8): 325 mg via ORAL
  Filled 2017-06-05 (×8): qty 1

## 2017-06-05 NOTE — ED Provider Notes (Signed)
[facility] DEPT Provider Note   CSN: [id number] Arrival date & time: 06/05/17  1527     History   Chief Complaint Chief Complaint  Patient presents with  . Hip Pain    HPI Kayla [name] is a 67 y.o. female with a history of MS, COPD, vocal cord dysfunction, right sided total hip replacement, who presents today for evaluation.  She reports that she was bending over when she felt her right hip pop.  She denies any chest pain, shortness of breath.  She reports she did not fall or strike her head.  She reports that her MS causes her weakness in her legs, and hands.  She was given 250 of fentanyl in route by [facility], when she arrived her saturation was 64% on 2 L of oxygen, she was placed on a nonrebreather by RN which increased her saturation.  When I saw her she was very sleepy, however would awaken.    She reports that she has been sick recently and generally not feeling well.   She does not take any blood thinning medications.     HPI  Past Medical History:  Diagnosis Date  . Arthritis    oa  . Cervical cancer ([place]) 2005  . COPD (chronic obstructive pulmonary disease) ([place])   . Multiple sclerosis ([place])   . Osteoporosis   . Other diseases of vocal cords    vocal cords are not even  . Other voice and resonance disorders   . Pneumonia 2014, 1992  . Sleep apnea    no cpap used  . Unspecified hypothyroidism     Patient Active Problem List   Diagnosis Date Noted  . Cough variant asthma vs vcd  related to [facility]  11/10/2016  . Positional sleep apnea 09/10/2016  . Periodic limb movements of sleep 09/10/2016  . OSA on CPAP 02/08/2016  . S/P revision right [facility] 09/18/2015  . Leg swelling 08/21/2014  . Acute URI 04/04/2014  . Health care maintenance 12/16/2013  . Other and unspecified hyperlipidemia 11/04/2013  . 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 S/P hip hemiarthroplasty 03/15/2013  . Dislocation of hip prosthesis ([place]) 03/12/2013  . Hip  dislocation, left ([place]) 03/12/2013  . Hip dislocation, right ([place]) 03/12/2013  . Urinary retention with incomplete bladder emptying 03/09/2013  . Dysphagia, pharyngoesophageal phase 03/09/2013  . Femoral neck fracture ([place]) 03/08/2013  . Unspecified constipation 02/26/2013  . Fractured femoral neck ([place]) 02/23/2013  . Hip fracture ([place]) 02/23/2013  . Excessive flatus 10/21/2012  . Cold feet 07/29/2012  . Hyponatremia 05/20/2012  . Calf pain 11/17/2011  . Dyspnea 12/06/2007  . OSTEOPENIA 09/17/2007  . Hypothyroidism 04/01/2007  . Multiple sclerosis ([place]) 04/01/2007  . Other diseases of vocal cords 04/01/2007    Past Surgical History:  Procedure Laterality Date  . ABDOMINAL HYSTERECTOMY  2005   for cervical cancer, complete  . CONVERSION TO TOTAL HIP Right 09/18/2015   Procedure: CONVERSION OF RIGHT HEMI ARTHROPLASTY TO TOTAL HIP ARTHROPLASTY ACETABULAR REVISON ;  Surgeon: Paralee Cancel, MD;  Location: [facility];  Service: Orthopedics;  Laterality: Right;  . EYE SURGERY Bilateral    lens for cataracts  . HIP ARTHROPLASTY Right 03/01/2013   Procedure: ARTHROPLASTY BIPOLAR HIP;  Surgeon: Mauri Pole, MD;  Location: [facility];  Service: Orthopedics;  Laterality: Right;  . HIP CLOSED REDUCTION Right 03/12/2013   Procedure: CLOSED REDUCTION HIP;  Surgeon: Gearlean Alf, MD;  Location: [place];  Service: Orthopedics;  Laterality: Right;  .  HIP CLOSED REDUCTION Right 03/19/2013   Procedure: CLOSED REDUCTION HIP;  Surgeon: Johnn Hai, MD;  Location: [place];  Service: Orthopedics;  Laterality: Right;  . HIP CLOSED REDUCTION Right 11/10/2015   Procedure: CLOSED MANIPULATION HIP;  Surgeon: Rod Can, MD;  Location: [facility];  Service: Orthopedics;  Laterality: Right;  Marland Kitchen VESICOVAGINAL FISTULA CLOSURE W/ TAH  2005    OB History    No data available       Home Medications    Prior to Admission medications   Medication Sig Start Date End Date Taking? Authorizing Provider    amphetamine-dextroamphetamine (ADDERALL) 20 MG tablet Take 20 mg by mouth 2 (two) times daily.    Yes [provider]  baclofen (LIORESAL) 20 MG tablet Take 20 mg by mouth 3 (three) times daily.    Yes Love, Ivan Anchors, PA-C  Calcium Carbonate-Vitamin D (CALCIUM 600+D) 600-400 MG-UNIT tablet Take 1 tablet by mouth daily.   Yes [provider]  cholecalciferol (VITAMIN D) 1000 UNITS tablet Take 2,000 Units by mouth daily.    Yes [provider]  DEXILANT 60 MG capsule TAKE 1 CAPSULE ONCE DAILY WITH BREAKFAST. 02/20/17  Yes Tanda Rockers, MD  DULERA 100-5 MCG/ACT AERO INHALE 2 PUFFS IN THE AM AND 2 PUFFS IN THE PM ABOUT 12 HOURS APART. 03/18/17  Yes Tanda Rockers, MD  FLUoxetine (PROZAC) 40 MG capsule Take 40 mg by mouth daily.     Yes [provider]  furosemide (LASIX) 20 MG tablet Take 1 tablet (20 mg total) by mouth daily. 05/14/17  Yes Tanda Rockers, MD  gabapentin (NEURONTIN) 600 MG tablet Take 600 mg by mouth 2 (two) times daily.   Yes [provider]  IBU 800 MG tablet Take 1 tablet by mouth 3 (three) times daily. 04/16/17  Yes [provider]  levothyroxine (SYNTHROID, LEVOTHROID) 100 MCG tablet TAKE 1 TABLET ONCE DAILY BEFORE BREAKFAST. 03/09/17  Yes Tanda Rockers, MD  Multiple Vitamin (MULTIVITAMIN WITH MINERALS) TABS tablet Take 1 tablet by mouth daily.   Yes [provider]  ocrelizumab (OCREVUS) 300 MG/10ML injection Inject 300 mg into the vein every 6 (six) months.    Yes [provider]  temazepam (RESTORIL) 15 MG capsule Take 15 mg by mouth at bedtime.    Yes [provider]  VENTOLIN HFA 108 (90 Base) MCG/ACT inhaler INHALE 2 PUFFS INTO LUNGS EVERY 6 HOURS AS NEEDED. Patient taking differently: INHALE 2 PUFFS INTO LUNGS EVERY 6 HOURS AS NEEDED FOR SOB AND WHEEZING 05/16/16  Yes Tanda Rockers, MD  acetaminophen (TYLENOL 8 HOUR) 650 MG CR tablet Take 1 tablet (650 mg total) by mouth every 6 (six)  hours. Patient not taking: Reported on 06/05/2017 01/07/17   Varney Biles, MD  azithromycin (ZITHROMAX) 250 MG tablet Take 2 on day one then 1 daily x 4 days Patient not taking: Reported on 06/05/2017 05/14/17   Tanda Rockers, MD  ciprofloxacin (CIPRO) 500 MG tablet Take 1 tablet (500 mg total) by mouth 2 (two) times daily. Patient not taking: Reported on 06/05/2017 05/14/17   Tanda Rockers, MD  predniSONE (DELTASONE) 10 MG tablet Take  4 each am x 2 days,   2 each am x 2 days,  1 each am x 2 days and stop Patient not taking: Reported on 06/05/2017 05/14/17   Tanda Rockers, MD  Respiratory Therapy Supplies (FLUTTER) DEVI Use as directed 06/02/17   Tanda Rockers, MD  TURMERIC PO Take 500 mg by mouth 2 (two) times daily.     [provider]    Family History Family History  Problem Relation Age of Onset  . Colon cancer Father     Social History Social History   Tobacco Use  . Smoking status: Never Smoker  . Smokeless tobacco: Never Used  Substance Use Topics  . Alcohol use: Yes    Comment: rare  . Drug use: No     Allergies   Tramadol; Iohexol; Methylprednisolone; Morphine and related; Oxycodone-acetaminophen; Phenytoin sodium extended; and Zanaflex [tizanidine hydrochloride]   Review of Systems Review of Systems  Constitutional: Negative for chills and fever.  HENT: Negative for ear pain and sore throat.   Eyes: Negative for pain and visual disturbance.  Respiratory: Negative for cough and shortness of breath.   Cardiovascular: Negative for chest pain and palpitations.  Gastrointestinal: Negative for abdominal pain, nausea and vomiting.  Genitourinary: Negative for dysuria, hematuria and urgency.  Musculoskeletal: Positive for arthralgias (Right hip pain). Negative for back pain, neck pain and neck stiffness.  Skin: Negative for color change and rash.  Neurological: Negative for dizziness, seizures, syncope and headaches.  All other systems reviewed and are  negative.    Physical Exam Updated Vital Signs BP (!) 88/68   Pulse 60   Temp (!) 97.3 F (36.3 C) (Oral)   Resp 20   SpO2 100%   Physical Exam  Constitutional: She is oriented to person, place, and time. She appears well-developed and well-nourished. No distress.  HENT:  Head: Normocephalic and atraumatic.  Right Ear: External ear normal.  Left Ear: External ear normal.  Mouth/Throat: Oropharynx is clear and moist.  Eyes: Conjunctivae are normal.  Neck: Normal range of motion. Neck supple. No JVD present.  Cardiovascular: Normal rate, regular rhythm and intact distal pulses.  No murmur heard. 2+ DP/PT pulses bilaterally.  Pulmonary/Chest: Effort normal and breath sounds normal. No respiratory distress.  Abdominal: Soft. Bowel sounds are normal. She exhibits no distension. There is no tenderness. There is no guarding.  Musculoskeletal: She exhibits tenderness. She exhibits no edema.  Pain and tender to palpation in right hip.  Right leg is shortened, internally rotated.  There are no obvious deformities palpated through knee, ankle.    Neurological: She is oriented to person, place, and time.  Patient is sleepy, awakens to voice and then is alert and oriented.  Skin: Skin is warm and dry. She is not diaphoretic.  Psychiatric: She has a normal mood and affect. Her behavior is normal.  Nursing note and vitals reviewed.    ED Treatments / Results  Labs (all labs ordered are listed, but only abnormal results are displayed) Labs Reviewed  COMPREHENSIVE METABOLIC PANEL - Abnormal; Notable for the following components:      Result Value   Glucose, Bld 107 (*)    Calcium 8.4 (*)    Total Protein 6.0 (*)    ALT 11 (*)    All other components within normal limits  CBC WITH DIFFERENTIAL/PLATELET - Abnormal; Notable for the following components:   RBC 3.67 (*)    Hemoglobin 10.7 (*)    HCT 32.9 (*)    All other components within normal limits    EKG  EKG  Interpretation None       Radiology Dg Chest Port 1 View  Result Date: 06/05/2017 CLINICAL DATA:  Hypoxia. EXAM: PORTABLE CHEST 1 VIEW COMPARISON:  Radiographs of September 24, 2015. FINDINGS: The heart size  and mediastinal contours are within normal limits. No pneumothorax or pleural effusion is noted. Right lung is clear. Mild left basilar subsegmental atelectasis is noted. The visualized skeletal structures are unremarkable. IMPRESSION: Mild left basilar subsegmental atelectasis. Electronically Signed   By: Marijo Conception, M.D.   On: 06/05/2017 16:21   Dg Hip Unilat With Pelvis 2-3 Views Right  Result Date: 06/05/2017 CLINICAL DATA:  Right hip pain after fall and injury. EXAM: DG HIP (WITH OR WITHOUT PELVIS) 2-3V RIGHT COMPARISON:  Radiographs of November 10, 2015. FINDINGS: Superior dislocation of right femoral prosthesis relative to acetabular prosthesis is noted. No definite fracture or dislocation is noted. IMPRESSION: Superior dislocation of right femoral prosthesis. Electronically Signed   By: Marijo Conception, M.D.   On: 06/05/2017 16:23  - While note reports she had a fall she denies falls to me.   Procedures Procedures (including critical care time)  Medications Ordered in ED Medications  fentaNYL (SUBLIMAZE) injection 100 mcg (100 mcg Intravenous Given 06/05/17 1716)     Initial Impression / Assessment and Plan / ED Course  I have reviewed the triage vital signs and the nursing notes.  Pertinent labs & imaging results that were available during my care of the patient were reviewed by me and considered in my medical decision making (see chart for details).  Clinical Course as of Jun 06 1818  Fri Jun 05, 2017  1749 Was seen and evaluated by Dr. Ralene Bathe  [EH]  [phone] Spoke with Orthopedics, Dr. Gladstone Lighter who will take patient to OR for closed reduction.   [EH]  1810 Noticed BP trending down.  Just cycled BP with systolic of 166.    [EH]    Clinical Course User Index [EH] Lorin Glass, PA-C   Patient presents today for evaluation of sudden onset of right hip pain.  She reports that she was bending over when she felt her right hip pop out of place.  She reports that this feels similar to her previous dislocations.  She has a right total hip replacement was done in 2017.  She denies any other injuries, states that she did not fall.  X-rays were obtained showing dislocation.  She was hypoxic upon arrival, which I suspect is a combination of her MS and receiving 250 of fentanyl by EMS in route.  This resolved with oxygen and stimulation/allowing fentanyl to wear off.  CXR obtained and reviewed.  I discussed this patient with Dr. Ralene Bathe, who feels like patient is high risk.  Patient has multiple issues that make her a high risk candidate for sedation, including her MS, vocal cord paralysis/dysfunction, intermittent hypoxia, and history of going apneic when sedated for reductions.  With this history, we feel like it would be safest for her to have her hip reduced in the OR.  I spoke with orthopedics who will take patient to the OR for closed reduction.     Final Clinical Impressions(s) / ED Diagnoses   Final diagnoses:  Dislocation of right hip, initial encounter [facility])    ED Discharge Orders    None       Ollen Gross 06/05/17 Edsel Petrin, MD 06/08/17 1259

## 2017-06-05 NOTE — Anesthesia Preprocedure Evaluation (Addendum)
Anesthesia Evaluation  Patient identified by MRN, date of birth, ID band Patient awake    Reviewed: Allergy & Precautions, NPO status , Patient's Chart, lab work & pertinent test results  Airway Mallampati: II  TM Distance: >3 FB Neck ROM: full    Dental   Pulmonary shortness of breath, asthma , sleep apnea , COPD,    breath sounds clear to auscultation       Cardiovascular negative cardio ROS   Rhythm:regular Rate:Normal     Neuro/Psych MS    GI/Hepatic negative GI ROS, Neg liver ROS,   Endo/Other  Hypothyroidism   Renal/GU negative Renal ROS     Musculoskeletal  (+) Arthritis ,   Abdominal   Peds  Hematology  (+) anemia ,   Anesthesia Other Findings   Reproductive/Obstetrics                             Anesthesia Physical  Anesthesia Plan  ASA: II  Anesthesia Plan: General   Post-op Pain Management:    Induction: Intravenous  PONV Risk Score and Plan: 2 and Ondansetron, Propofol infusion and Treatment may vary due to age or medical condition  Airway Management Planned: Mask, Natural Airway and LMA  Additional Equipment:   Intra-op Plan:   Post-operative Plan: Extubation in OR  Informed Consent: I have reviewed the patients History and Physical, chart, labs and discussed the procedure including the risks, benefits and alternatives for the proposed anesthesia with the patient or authorized representative who has indicated his/her understanding and acceptance.     Plan Discussed with: CRNA  Anesthesia Plan Comments:        Anesthesia Quick Evaluation

## 2017-06-05 NOTE — Anesthesia Procedure Notes (Signed)
Procedure Name: LMA Insertion Date/Time: 06/05/2017 7:32 PM Performed by: Anne Fu, CRNA Pre-anesthesia Checklist: Patient identified, Emergency Drugs available, Suction available, Patient being monitored and Timeout performed Patient Re-evaluated:Patient Re-evaluated prior to induction Oxygen Delivery Method: Circle system utilized Preoxygenation: Pre-oxygenation with 100% oxygen Induction Type: IV induction Ventilation: Mask ventilation without difficulty LMA: LMA inserted LMA Size: 4.0 Number of attempts: 1 Placement Confirmation: positive ETCO2 and breath sounds checked- equal and bilateral Tube secured with: Tape Comments: Place by Ola Spurr MD. X 1 attempt without difficulty.

## 2017-06-05 NOTE — ED Triage Notes (Addendum)
Patient arrived via [facility] from home c/o right hip pain. Pt given 251mcg fentanyl to help pain by [facility] and 109mls of NS. Patient reports that she was bending over when her hip dislocated. Pt has hx of same. Pt's O2 saturation 64 on 2L O2. Pt placed on NRB by this RN and O2 saturation 100% on 15L.

## 2017-06-05 NOTE — H&P (Signed)
Kayla [name] is an 67 y.o. female.   Chief Complaint: Pain in her Right Hip HPI: She was bending over and Dislocated her Right Total Hip  Past Medical History:  Diagnosis Date  . Arthritis    oa  . Cervical cancer ([place]) 2005  . COPD (chronic obstructive pulmonary disease) ([place])   . Multiple sclerosis ([place])   . Osteoporosis   . Other diseases of vocal cords    vocal cords are not even  . Other voice and resonance disorders   . Pneumonia 2014, 1992  . Sleep apnea    no cpap used  . Unspecified hypothyroidism     Past Surgical History:  Procedure Laterality Date  . ABDOMINAL HYSTERECTOMY  2005   for cervical cancer, complete  . CONVERSION TO TOTAL HIP Right 09/18/2015   Procedure: CONVERSION OF RIGHT HEMI ARTHROPLASTY TO TOTAL HIP ARTHROPLASTY ACETABULAR REVISON ;  Surgeon: Paralee Cancel, MD;  Location: [facility];  Service: Orthopedics;  Laterality: Right;  . EYE SURGERY Bilateral    lens for cataracts  . HIP ARTHROPLASTY Right 03/01/2013   Procedure: ARTHROPLASTY BIPOLAR HIP;  Surgeon: Mauri Pole, MD;  Location: [facility];  Service: Orthopedics;  Laterality: Right;  . HIP CLOSED REDUCTION Right 03/12/2013   Procedure: CLOSED REDUCTION HIP;  Surgeon: Gearlean Alf, MD;  Location: [facility];  Service: Orthopedics;  Laterality: Right;  . HIP CLOSED REDUCTION Right 03/19/2013   Procedure: CLOSED REDUCTION HIP;  Surgeon: Johnn Hai, MD;  Location: [facility];  Service: Orthopedics;  Laterality: Right;  . HIP CLOSED REDUCTION Right 11/10/2015   Procedure: CLOSED MANIPULATION HIP;  Surgeon: Rod Can, MD;  Location: [facility];  Service: Orthopedics;  Laterality: Right;  Marland Kitchen VESICOVAGINAL FISTULA CLOSURE W/ TAH  2005    Family History  Problem Relation Age of Onset  . Colon cancer Father    Social History:  reports that  has never smoked. she has never used smokeless tobacco. She reports that she drinks alcohol. She reports that she does not use drugs.  Allergies:  Allergies   Allergen Reactions  . Tramadol     hallucinations   . Iohexol Hives  . Methylprednisolone Other (See Comments)    Reaction:  Decreases pts heart rate   . Morphine And Related Other (See Comments)    Reaction:  Hallucinations   . Oxycodone-Acetaminophen Rash and Other (See Comments)    Reaction:  Hallucinations   . Phenytoin Sodium Extended Rash  . Zanaflex [Tizanidine Hydrochloride] Rash    Medications Prior to Admission  Medication Sig Dispense Refill  . amphetamine-dextroamphetamine (ADDERALL) 20 MG tablet Take 20 mg by mouth 2 (two) times daily.     . baclofen (LIORESAL) 20 MG tablet Take 20 mg by mouth 3 (three) times daily.     . Calcium Carbonate-Vitamin D (CALCIUM 600+D) 600-400 MG-UNIT tablet Take 1 tablet by mouth daily.    . cholecalciferol (VITAMIN D) 1000 UNITS tablet Take 2,000 Units by mouth daily.     Marland Kitchen DEXILANT 60 MG capsule TAKE 1 CAPSULE ONCE DAILY WITH BREAKFAST. 30 capsule 5  . DULERA 100-5 MCG/ACT AERO INHALE 2 PUFFS IN THE AM AND 2 PUFFS IN THE PM ABOUT 12 HOURS APART. 13 g 1  . FLUoxetine (PROZAC) 40 MG capsule Take 40 mg by mouth daily.      . furosemide (LASIX) 20 MG tablet Take 1 tablet (20 mg total) by mouth daily. 45 tablet 11  . gabapentin (NEURONTIN) 600 MG tablet  Take 600 mg by mouth 2 (two) times daily.    . IBU 800 MG tablet Take 1 tablet by mouth 3 (three) times daily.  0  . levothyroxine (SYNTHROID, LEVOTHROID) 100 MCG tablet TAKE 1 TABLET ONCE DAILY BEFORE BREAKFAST. 30 tablet 3  . Multiple Vitamin (MULTIVITAMIN WITH MINERALS) TABS tablet Take 1 tablet by mouth daily.    Marland Kitchen ocrelizumab (OCREVUS) 300 MG/10ML injection Inject 300 mg into the vein every 6 (six) months.     . temazepam (RESTORIL) 15 MG capsule Take 15 mg by mouth at bedtime.     . VENTOLIN HFA 108 (90 Base) MCG/ACT inhaler INHALE 2 PUFFS INTO LUNGS EVERY 6 HOURS AS NEEDED. (Patient taking differently: INHALE 2 PUFFS INTO LUNGS EVERY 6 HOURS AS NEEDED FOR SOB AND WHEEZING) 18 g 2  .  acetaminophen (TYLENOL 8 HOUR) 650 MG CR tablet Take 1 tablet (650 mg total) by mouth every 6 (six) hours. (Patient not taking: Reported on 06/05/2017) 60 tablet 0  . azithromycin (ZITHROMAX) 250 MG tablet Take 2 on day one then 1 daily x 4 days (Patient not taking: Reported on 06/05/2017) 6 tablet 0  . ciprofloxacin (CIPRO) 500 MG tablet Take 1 tablet (500 mg total) by mouth 2 (two) times daily. (Patient not taking: Reported on 06/05/2017) 10 tablet 0  . predniSONE (DELTASONE) 10 MG tablet Take  4 each am x 2 days,   2 each am x 2 days,  1 each am x 2 days and stop (Patient not taking: Reported on 06/05/2017) 14 tablet 0  . Respiratory Therapy Supplies (FLUTTER) DEVI Use as directed 1 each 0  . TURMERIC PO Take 500 mg by mouth 2 (two) times daily.       Results for orders placed or performed during the hospital encounter of 06/05/17 (from the past 48 hour(s))  Comprehensive metabolic panel     Status: Abnormal   Collection Time: 06/05/17  4:45 PM  Result Value Ref Range   Sodium 137 135 - 145 mmol/L   Potassium 4.1 3.5 - 5.1 mmol/L   Chloride 102 101 - 111 mmol/L   CO2 25 22 - 32 mmol/L   Glucose, Bld 107 (H) 65 - 99 mg/dL   BUN 18 6 - 20 mg/dL   Creatinine, Ser 0.97 0.44 - 1.00 mg/dL   Calcium 8.4 (L) 8.9 - 10.3 mg/dL   Total Protein 6.0 (L) 6.5 - 8.1 g/dL   Albumin 3.7 3.5 - 5.0 g/dL   AST 23 15 - 41 U/L   ALT 11 (L) 14 - 54 U/L   Alkaline Phosphatase 50 38 - 126 U/L   Total Bilirubin 0.7 0.3 - 1.2 mg/dL   GFR calc non Af Amer >60 >60 mL/min   GFR calc Af Amer >60 >60 mL/min    Comment: (NOTE) The eGFR has been calculated using the CKD EPI equation. This calculation has not been validated in all clinical situations. eGFR's persistently <60 mL/min signify possible Chronic Kidney Disease.    Anion gap 10 5 - 15    Comment: Performed at [facility], [place] [address] [place], [place] [address]  CBC with Differential     Status: Abnormal   Collection Time: 06/05/17   4:45 PM  Result Value Ref Range   WBC 6.4 4.0 - 10.5 K/uL   RBC 3.67 (L) 3.87 - 5.11 MIL/uL   Hemoglobin 10.7 (L) 12.0 - 15.0 g/dL   HCT 32.9 (L) 36.0 - 46.0 %  MCV 89.6 78.0 - 100.0 fL   MCH 29.2 26.0 - 34.0 pg   MCHC 32.5 30.0 - 36.0 g/dL   RDW 14.5 11.5 - 15.5 %   Platelets 343 150 - 400 K/uL   Neutrophils Relative % 74 %   Neutro Abs 4.7 1.7 - 7.7 K/uL   Lymphocytes Relative 15 %   Lymphs Abs 0.9 0.7 - 4.0 K/uL   Monocytes Relative 9 %   Monocytes Absolute 0.6 0.1 - 1.0 K/uL   Eosinophils Relative 2 %   Eosinophils Absolute 0.1 0.0 - 0.7 K/uL   Basophils Relative 0 %   Basophils Absolute 0.0 0.0 - 0.1 K/uL    Comment: Performed at [facility], [place] [address], [place], [place] [address]   Dg Chest Port 1 View  Result Date: 06/05/2017 CLINICAL DATA:  Hypoxia. EXAM: PORTABLE CHEST 1 VIEW COMPARISON:  Radiographs of September 24, 2015. FINDINGS: The heart size and mediastinal contours are within normal limits. No pneumothorax or pleural effusion is noted. Right lung is clear. Mild left basilar subsegmental atelectasis is noted. The visualized skeletal structures are unremarkable. IMPRESSION: Mild left basilar subsegmental atelectasis. Electronically Signed   By: Marijo Conception, M.D.   On: 06/05/2017 16:21   Dg Hip Unilat With Pelvis 2-3 Views Right  Result Date: 06/05/2017 CLINICAL DATA:  Right hip pain after fall and injury. EXAM: DG HIP (WITH OR WITHOUT PELVIS) 2-3V RIGHT COMPARISON:  Radiographs of November 10, 2015. FINDINGS: Superior dislocation of right femoral prosthesis relative to acetabular prosthesis is noted. No definite fracture or dislocation is noted. IMPRESSION: Superior dislocation of right femoral prosthesis. Electronically Signed   By: Marijo Conception, M.D.   On: 06/05/2017 16:23    Review of Systems  Constitutional: Positive for malaise/fatigue.  HENT: Negative.   Eyes: Negative.   Respiratory: Positive for shortness of breath.   Cardiovascular:  Negative.   Gastrointestinal: Negative.   Genitourinary: Negative.   Musculoskeletal: Positive for joint pain.  Skin: Negative.   Neurological: Positive for focal weakness and weakness.  Endo/Heme/Allergies: Negative.   Psychiatric/Behavioral: Negative.     Blood pressure (!) 88/68, pulse 60, temperature (!) 97.3 F (36.3 C), temperature source Oral, resp. rate 20, SpO2 100 %. Physical Exam  Constitutional: She appears distressed.  HENT:  Head: Normocephalic.  Eyes: Pupils are equal, round, and reactive to light.  Neck: Normal range of motion.  Cardiovascular: Normal rate.  Respiratory: Effort normal.  GI: Soft.  Musculoskeletal: She exhibits deformity.  Right Total Hip Dislocation.  Neurological: She is alert.  Skin: Skin is warm.  Psychiatric: She has a normal mood and affect.     Assessment/Plan Closed Reduction of her Right Total Hip Dislocation.  [name], MD 06/05/2017, 6:37 PM

## 2017-06-05 NOTE — Transfer of Care (Signed)
Immediate Anesthesia Transfer of Care Note  Patient: Kayla [name]  Procedure(s) Performed: Procedure(s): CLOSED REDUCTION HIP (Right)  Patient Location: PACU  Anesthesia Type:General  Level of Consciousness:  sedated, patient cooperative and responds to stimulation  Airway & Oxygen Therapy:Patient Spontanous Breathing and Patient connected to face mask oxgen  Post-op Assessment:  Report given to PACU RN and Post -op Vital signs reviewed and stable  Post vital signs:  Reviewed and stable  Last Vitals:  Vitals:   06/05/17 1745 06/05/17 1800  BP: (!) 96/48 (!) 88/68  Pulse: (!) 55 60  Resp:  20  Temp:    SpO2: 82% 956%    Complications: No apparent anesthesia complications

## 2017-06-05 NOTE — Brief Op Note (Signed)
06/05/2017  8:17 PM  PATIENT:  Kayla [name]  67 y.o. female  PRE-OPERATIVE DIAGNOSIS:  Closed DISLOCATION OF RIGHT Total Hip   POST-OPERATIVE DIAGNOSIS:   DISLOCATION OF RIGHT HIP  PROCEDURE:  Procedure(s): CLOSED REDUCTION HIP (Right) but unable to do so  SURGEON:  Surgeon(s) and Role:    [name], MD - Primary  PHYSICIAN ASSISTANT:Amber Francisville PA   ASSISTANTS: DR. Lyla Glassing MD and [name] PA  ANESTHESIA:   general  BDH:DIXB   BLOOD ADMINISTERED:none  DRAINS: none   LOCAL MEDICATIONS USED:  NONE  SPECIMEN:  No Specimen  DISPOSITION OF SPECIMEN:  N/A  COUNTS:  YES  TOURNIQUET:  * No tourniquets in log *  DICTATION: .Other Dictation: Dictation Number [id number]  PLAN OF CARE: Admit to inpatient   PATIENT DISPOSITION:  PACU - hemodynamically stable.   Delay start of Pharmacological VTE agent (>24hrs) due to surgical blood loss or risk of bleeding: yes

## 2017-06-05 NOTE — Interval H&P Note (Signed)
History and Physical Interval Note:  06/05/2017 6:42 PM  Kayla [name]  has presented today for surgery, with the diagnosis of dislocated left hip  The various methods of treatment have been discussed with the patient and family. After consideration of risks, benefits and other options for treatment, the patient has consented to  Procedure(s): CLOSED REDUCTION HIP (Left) as a surgical intervention .  [name] history has been reviewed, patient examined, no change in status, stable for surgery.  I have reviewed the patient's chart and labs.  Questions were answered to the patient's satisfaction.     [name]

## 2017-06-06 ENCOUNTER — Encounter (HOSPITAL_COMMUNITY): Payer: Self-pay | Admitting: *Deleted

## 2017-06-06 ENCOUNTER — Inpatient Hospital Stay (HOSPITAL_COMMUNITY): Payer: BLUE CROSS/BLUE SHIELD | Admitting: Certified Registered Nurse Anesthetist

## 2017-06-06 ENCOUNTER — Encounter (HOSPITAL_COMMUNITY)
Admission: EM | Disposition: A | Discharge status: Home Health | Payer: Self-pay | Source: Home / Self Care | Attending: Orthopedic Surgery

## 2017-06-06 ENCOUNTER — Inpatient Hospital Stay (HOSPITAL_COMMUNITY): Payer: BLUE CROSS/BLUE SHIELD

## 2017-06-06 ENCOUNTER — Other Ambulatory Visit: Payer: Self-pay

## 2017-06-06 HISTORY — PX: TOTAL HIP REVISION: SHX763

## 2017-06-06 LAB — CBC
HCT: 31.9 % — ABNORMAL LOW (ref 36.0–46.0)
Hemoglobin: 10.2 g/dL — ABNORMAL LOW (ref 12.0–15.0)
MCH: 29.2 pg (ref 26.0–34.0)
MCHC: 32 g/dL (ref 30.0–36.0)
MCV: 91.4 fL (ref 78.0–100.0)
Platelets: 303 10*3/uL (ref 150–400)
RBC: 3.49 MIL/uL — ABNORMAL LOW (ref 3.87–5.11)
RDW: 14.9 % (ref 11.5–15.5)
WBC: 6.7 10*3/uL (ref 4.0–10.5)

## 2017-06-06 LAB — SURGICAL PCR SCREEN
MRSA, PCR: NEGATIVE
Staphylococcus aureus: NEGATIVE

## 2017-06-06 LAB — COMPREHENSIVE METABOLIC PANEL
ALT: 8 U/L — ABNORMAL LOW (ref 14–54)
AST: 25 U/L (ref 15–41)
Albumin: 3.1 g/dL — ABNORMAL LOW (ref 3.5–5.0)
Alkaline Phosphatase: 44 U/L (ref 38–126)
Anion gap: 11 (ref 5–15)
BUN: 15 mg/dL (ref 6–20)
CO2: 24 mmol/L (ref 22–32)
Calcium: 8.2 mg/dL — ABNORMAL LOW (ref 8.9–10.3)
Chloride: 102 mmol/L (ref 101–111)
Creatinine, Ser: 0.76 mg/dL (ref 0.44–1.00)
GFR calc Af Amer: >60 mL/min (ref >60)
GFR calc non Af Amer: >60 mL/min (ref >60)
Glucose, Bld: 73 mg/dL (ref 65–99)
Potassium: 4.1 mmol/L (ref 3.5–5.1)
Sodium: 137 mmol/L (ref 135–145)
Total Bilirubin: 0.9 mg/dL (ref 0.3–1.2)
Total Protein: 5 g/dL — ABNORMAL LOW (ref 6.5–8.1)

## 2017-06-06 LAB — HIV ANTIBODY (ROUTINE TESTING W REFLEX): HIV Screen 4th Generation wRfx: NONREACTIVE

## 2017-06-06 LAB — PROTIME-INR
INR: 1
Prothrombin Time: 13.1 seconds (ref 11.4–15.2)

## 2017-06-06 LAB — GLUCOSE, CAPILLARY: Glucose-Capillary: 95 mg/dL (ref 65–99)

## 2017-06-06 LAB — APTT: aPTT: 27 seconds (ref 24–36)

## 2017-06-06 SURGERY — TOTAL HIP REVISION
Anesthesia: General | Site: Hip | Laterality: Right

## 2017-06-06 MED ORDER — CEFAZOLIN SODIUM-DEXTROSE 2-4 GM/100ML-% IV SOLN
INTRAVENOUS | Status: AC
  Filled 2017-06-06: qty 100

## 2017-06-06 MED ORDER — SODIUM CHLORIDE 0.9 % IV SOLN
INTRAVENOUS | Status: AC
  Filled 2017-06-06: qty 500000

## 2017-06-06 MED ORDER — OXYCODONE HCL 5 MG PO TABS: 5.0000 mg | ORAL_TABLET | Freq: Once | ORAL | Status: DC | PRN

## 2017-06-06 MED ORDER — MIDAZOLAM HCL 5 MG/5ML IJ SOLN
INTRAMUSCULAR | Status: DC | PRN
  Administered 2017-06-06: 1 mg via INTRAVENOUS

## 2017-06-06 MED ORDER — MOMETASONE FURO-FORMOTEROL FUM 100-5 MCG/ACT IN AERO
2.0000 | INHALATION_SPRAY | Freq: Two times a day (BID) | RESPIRATORY_TRACT | Status: DC
  Administered 2017-06-06 – 2017-06-09 (×6): 2 via RESPIRATORY_TRACT
  Filled 2017-06-06: qty 8.8

## 2017-06-06 MED ORDER — CEFAZOLIN SODIUM-DEXTROSE 2-3 GM-%(50ML) IV SOLR
INTRAVENOUS | Status: DC | PRN
  Administered 2017-06-06: 2 g via INTRAVENOUS

## 2017-06-06 MED ORDER — LIDOCAINE 2% (20 MG/ML) 5 ML SYRINGE
INTRAMUSCULAR | Status: DC | PRN
  Administered 2017-06-06: 60 mg via INTRAVENOUS

## 2017-06-06 MED ORDER — LACTATED RINGERS IV SOLN
INTRAVENOUS | Status: DC | PRN
  Administered 2017-06-06: 14:00:00 via INTRAVENOUS

## 2017-06-06 MED ORDER — VANCOMYCIN HCL 1000 MG IV SOLR
INTRAVENOUS | Status: DC | PRN
  Administered 2017-06-06: 1000 mg via INTRAVENOUS

## 2017-06-06 MED ORDER — STERILE WATER FOR IRRIGATION IR SOLN
Status: DC | PRN
  Administered 2017-06-06: 1000 mL

## 2017-06-06 MED ORDER — DOCUSATE SODIUM 100 MG PO CAPS: 100.0000 mg | ORAL_CAPSULE | Freq: Two times a day (BID) | ORAL | Status: DC

## 2017-06-06 MED ORDER — OXYCODONE HCL 5 MG/5ML PO SOLN: 5.0000 mg | Freq: Once | ORAL | Status: DC | PRN

## 2017-06-06 MED ORDER — METOCLOPRAMIDE HCL 5 MG/ML IJ SOLN: 5.0000 mg | Freq: Three times a day (TID) | INTRAMUSCULAR | Status: DC | PRN

## 2017-06-06 MED ORDER — PHENYLEPHRINE 40 MCG/ML (10ML) SYRINGE FOR IV PUSH (FOR BLOOD PRESSURE SUPPORT)
PREFILLED_SYRINGE | INTRAVENOUS | Status: DC | PRN
  Administered 2017-06-06 (×5): 80 ug via INTRAVENOUS

## 2017-06-06 MED ORDER — HYDROMORPHONE HCL 1 MG/ML IJ SOLN
0.5000 mg | INTRAMUSCULAR | Status: DC | PRN
  Administered 2017-06-06 (×4): 0.5 mg via INTRAVENOUS
  Filled 2017-06-06 (×4): qty 0.5

## 2017-06-06 MED ORDER — ALUM & MAG HYDROXIDE-SIMETH 200-200-20 MG/5ML PO SUSP: 30.0000 mL | ORAL | Status: DC | PRN

## 2017-06-06 MED ORDER — ONDANSETRON HCL 4 MG/2ML IJ SOLN
INTRAMUSCULAR | Status: AC
  Filled 2017-06-06: qty 2

## 2017-06-06 MED ORDER — METHOCARBAMOL 500 MG PO TABS
500.0000 mg | ORAL_TABLET | Freq: Four times a day (QID) | ORAL | Status: DC | PRN
  Administered 2017-06-08 – 2017-06-09 (×2): 500 mg via ORAL
  Filled 2017-06-06 (×2): qty 1

## 2017-06-06 MED ORDER — FERROUS SULFATE 325 (65 FE) MG PO TABS: 325.0000 mg | ORAL_TABLET | Freq: Three times a day (TID) | ORAL | Status: DC

## 2017-06-06 MED ORDER — ALBUTEROL SULFATE (2.5 MG/3ML) 0.083% IN NEBU
2.5000 mg | INHALATION_SOLUTION | Freq: Four times a day (QID) | RESPIRATORY_TRACT | Status: DC
  Administered 2017-06-06: 2.5 mg via RESPIRATORY_TRACT
  Filled 2017-06-06: qty 3

## 2017-06-06 MED ORDER — FENTANYL CITRATE (PF) 100 MCG/2ML IJ SOLN: 25.0000 ug | INTRAMUSCULAR | Status: DC | PRN

## 2017-06-06 MED ORDER — METHOCARBAMOL 1000 MG/10ML IJ SOLN
500.0000 mg | Freq: Four times a day (QID) | INTRAVENOUS | Status: DC | PRN
  Administered 2017-06-06: 500 mg via INTRAVENOUS
  Filled 2017-06-06: qty 550

## 2017-06-06 MED ORDER — MEPERIDINE HCL 50 MG/ML IJ SOLN: 6.2500 mg | INTRAMUSCULAR | Status: DC | PRN

## 2017-06-06 MED ORDER — BISACODYL 5 MG PO TBEC: 5.0000 mg | DELAYED_RELEASE_TABLET | Freq: Every day | ORAL | Status: DC | PRN

## 2017-06-06 MED ORDER — ROCURONIUM BROMIDE 10 MG/ML (PF) SYRINGE
PREFILLED_SYRINGE | INTRAVENOUS | Status: DC | PRN
  Administered 2017-06-06: 40 mg via INTRAVENOUS

## 2017-06-06 MED ORDER — PROPOFOL 10 MG/ML IV BOLUS
INTRAVENOUS | Status: AC
Start: 2017-06-06 — End: 2017-06-06
  Filled 2017-06-06: qty 20

## 2017-06-06 MED ORDER — EPHEDRINE 5 MG/ML INJ
INTRAVENOUS | Status: AC
  Filled 2017-06-06: qty 10

## 2017-06-06 MED ORDER — APIXABAN 2.5 MG PO TABS
2.5000 mg | ORAL_TABLET | Freq: Two times a day (BID) | ORAL | Status: DC
  Administered 2017-06-07 – 2017-06-09 (×5): 2.5 mg via ORAL
  Filled 2017-06-06 (×5): qty 1

## 2017-06-06 MED ORDER — SUGAMMADEX SODIUM 200 MG/2ML IV SOLN
INTRAVENOUS | Status: AC
  Filled 2017-06-06: qty 2

## 2017-06-06 MED ORDER — ACETAMINOPHEN 325 MG PO TABS: 325.0000 mg | ORAL_TABLET | ORAL | Status: DC | PRN

## 2017-06-06 MED ORDER — MIDAZOLAM HCL 2 MG/2ML IJ SOLN
INTRAMUSCULAR | Status: AC
  Filled 2017-06-06: qty 2

## 2017-06-06 MED ORDER — ONDANSETRON HCL 4 MG/2ML IJ SOLN: 4.0000 mg | Freq: Four times a day (QID) | INTRAMUSCULAR | Status: DC | PRN

## 2017-06-06 MED ORDER — POLYETHYLENE GLYCOL 3350 17 G PO PACK: 17.0000 g | PACK | Freq: Every day | ORAL | Status: DC | PRN

## 2017-06-06 MED ORDER — LIDOCAINE 2% (20 MG/ML) 5 ML SYRINGE
INTRAMUSCULAR | Status: AC
  Filled 2017-06-06: qty 5

## 2017-06-06 MED ORDER — SODIUM CHLORIDE 0.9 % IV SOLN
INTRAVENOUS | Status: DC | PRN
  Administered 2017-06-06: 500 mL

## 2017-06-06 MED ORDER — CEFAZOLIN SODIUM-DEXTROSE 1-4 GM/50ML-% IV SOLN
1.0000 g | Freq: Four times a day (QID) | INTRAVENOUS | Status: AC
  Administered 2017-06-06 (×2): 1 g via INTRAVENOUS
  Filled 2017-06-06 (×2): qty 50

## 2017-06-06 MED ORDER — VANCOMYCIN HCL IN DEXTROSE 1-5 GM/200ML-% IV SOLN
1000.0000 mg | Freq: Once | INTRAVENOUS | Status: DC
  Filled 2017-06-06 (×2): qty 200

## 2017-06-06 MED ORDER — EPHEDRINE SULFATE-NACL 50-0.9 MG/10ML-% IV SOSY
PREFILLED_SYRINGE | INTRAVENOUS | Status: DC | PRN
  Administered 2017-06-06: 5 mg via INTRAVENOUS
  Administered 2017-06-06: 10 mg via INTRAVENOUS
  Administered 2017-06-06 (×4): 5 mg via INTRAVENOUS

## 2017-06-06 MED ORDER — SUGAMMADEX SODIUM 200 MG/2ML IV SOLN
INTRAVENOUS | Status: DC | PRN
  Administered 2017-06-06: 150 mg via INTRAVENOUS

## 2017-06-06 MED ORDER — PHENYLEPHRINE 40 MCG/ML (10ML) SYRINGE FOR IV PUSH (FOR BLOOD PRESSURE SUPPORT)
PREFILLED_SYRINGE | INTRAVENOUS | Status: AC
  Filled 2017-06-06: qty 10

## 2017-06-06 MED ORDER — PROPOFOL 10 MG/ML IV BOLUS
INTRAVENOUS | Status: DC | PRN
  Administered 2017-06-06: 100 mg via INTRAVENOUS

## 2017-06-06 MED ORDER — ONDANSETRON HCL 4 MG/2ML IJ SOLN: 4.0000 mg | Freq: Once | INTRAMUSCULAR | Status: DC | PRN

## 2017-06-06 MED ORDER — SODIUM CHLORIDE 0.9 % IR SOLN
Status: DC | PRN
  Administered 2017-06-06: 1000 mL

## 2017-06-06 MED ORDER — FENTANYL CITRATE (PF) 100 MCG/2ML IJ SOLN
INTRAMUSCULAR | Status: DC | PRN
  Administered 2017-06-06 (×2): 50 ug via INTRAVENOUS

## 2017-06-06 MED ORDER — ROCURONIUM BROMIDE 10 MG/ML (PF) SYRINGE
PREFILLED_SYRINGE | INTRAVENOUS | Status: AC
  Filled 2017-06-06: qty 5

## 2017-06-06 MED ORDER — KETOROLAC TROMETHAMINE 30 MG/ML IJ SOLN: 30.0000 mg | Freq: Once | INTRAMUSCULAR | Status: DC | PRN

## 2017-06-06 MED ORDER — ACETAMINOPHEN 160 MG/5ML PO SOLN: 325.0000 mg | ORAL | Status: DC | PRN

## 2017-06-06 MED ORDER — FENTANYL CITRATE (PF) 250 MCG/5ML IJ SOLN
INTRAMUSCULAR | Status: AC
  Filled 2017-06-06: qty 5

## 2017-06-06 MED ORDER — PHENOL 1.4 % MT LIQD
1.0000 | OROMUCOSAL | Status: DC | PRN
Start: 2017-06-06 — End: 2017-06-06

## 2017-06-06 MED ORDER — LEVOTHYROXINE SODIUM 100 MCG PO TABS
100.0000 ug | ORAL_TABLET | Freq: Every day | ORAL | Status: DC
  Administered 2017-06-07 – 2017-06-09 (×3): 100 ug via ORAL
  Filled 2017-06-06 (×3): qty 1

## 2017-06-06 MED ORDER — MENTHOL 3 MG MT LOZG: 1.0000 | LOZENGE | OROMUCOSAL | Status: DC | PRN

## 2017-06-06 MED ORDER — ALBUTEROL SULFATE (2.5 MG/3ML) 0.083% IN NEBU
2.5000 mg | INHALATION_SOLUTION | Freq: Three times a day (TID) | RESPIRATORY_TRACT | Status: DC
  Administered 2017-06-07 (×3): 2.5 mg via RESPIRATORY_TRACT
  Filled 2017-06-06 (×3): qty 3

## 2017-06-06 MED ORDER — METOCLOPRAMIDE HCL 5 MG PO TABS: 5.0000 mg | ORAL_TABLET | Freq: Three times a day (TID) | ORAL | Status: DC | PRN

## 2017-06-06 MED ORDER — ONDANSETRON HCL 4 MG PO TABS: 4.0000 mg | ORAL_TABLET | Freq: Four times a day (QID) | ORAL | Status: DC | PRN

## 2017-06-06 SURGICAL SUPPLY — 63 items
BAG SPEC THK2 15X12 ZIP CLS (MISCELLANEOUS) ×1
BAG ZIPLOCK 12X15 (MISCELLANEOUS) ×2 IMPLANT
BALL HIP CERAMIC (Hips) IMPLANT
BLADE EXTENDED COATED 6.5IN (ELECTRODE) ×2 IMPLANT
BLADE SAW SGTL 73X25 THK (BLADE) ×2 IMPLANT
BRUSH FEMORAL CANAL (MISCELLANEOUS) ×2 IMPLANT
CLOSURE STERI-STRIP 1/4X4 (GAUZE/BANDAGES/DRESSINGS) ×1 IMPLANT
COVER SURGICAL LIGHT HANDLE (MISCELLANEOUS) ×2 IMPLANT
DRAPE INCISE IOBAN 66X45 STRL (DRAPES) ×2 IMPLANT
DRAPE ORTHO SPLIT 77X108 STRL (DRAPES) ×4
DRAPE POUCH INSTRU U-SHP 10X18 (DRAPES) ×2 IMPLANT
DRAPE SURG 17X11 SM STRL (DRAPES) ×2 IMPLANT
DRAPE SURG ORHT 6 SPLT 77X108 (DRAPES) ×2 IMPLANT
DRAPE U-SHAPE 47X51 STRL (DRAPES) ×2 IMPLANT
DRAPE WARM FLUID 44X44 (DRAPE) ×2 IMPLANT
DRSG AQUACEL AG ADV 3.5X10 (GAUZE/BANDAGES/DRESSINGS) ×1 IMPLANT
DRSG EMULSION OIL 3X16 NADH (GAUZE/BANDAGES/DRESSINGS) ×2 IMPLANT
DRSG PAD ABDOMINAL 8X10 ST (GAUZE/BANDAGES/DRESSINGS) ×4 IMPLANT
DURAPREP 26ML APPLICATOR (WOUND CARE) ×2 IMPLANT
ELECT REM PT RETURN 15FT ADLT (MISCELLANEOUS) ×2 IMPLANT
EVACUATOR 1/8 PVC DRAIN (DRAIN) ×2 IMPLANT
FACESHIELD WRAPAROUND (MASK) ×8 IMPLANT
FACESHIELD WRAPAROUND OR TEAM (MASK) ×4 IMPLANT
GAUZE SPONGE 4X4 12PLY STRL (GAUZE/BANDAGES/DRESSINGS) ×4 IMPLANT
GLOVE BIOGEL PI IND STRL 8.5 (GLOVE) ×1 IMPLANT
GLOVE BIOGEL PI INDICATOR 8.5 (GLOVE) ×1
GLOVE ECLIPSE 8.0 STRL XLNG CF (GLOVE) ×4 IMPLANT
GLOVE SURG SS PI 6.5 STRL IVOR (GLOVE) ×4 IMPLANT
GOWN STRL REUS W/TWL LRG LVL3 (GOWN DISPOSABLE) ×6 IMPLANT
GOWN STRL REUS W/TWL XL LVL3 (GOWN DISPOSABLE) ×4 IMPLANT
HANDPIECE INTERPULSE COAX TIP (DISPOSABLE) ×2
HEAD FEM STD 32X+1 STRL (Hips) ×1 IMPLANT
HIP BALL CERAMIC (Hips) ×2 IMPLANT
IMMOBILIZER KNEE 20 (SOFTGOODS) ×1 IMPLANT
IMMOBILIZER KNEE 20 THIGH 36 (SOFTGOODS) IMPLANT
LINER PINNACLE (Hips) ×1 IMPLANT
MANIFOLD NEPTUNE II (INSTRUMENTS) ×2 IMPLANT
MARKER SKIN DUAL TIP RULER LAB (MISCELLANEOUS) ×2 IMPLANT
NDL MA TROC 1/2 (NEEDLE) ×1 IMPLANT
NEEDLE MA TROC 1/2 (NEEDLE) ×2 IMPLANT
NS IRRIG 1000ML POUR BTL (IV SOLUTION) ×4 IMPLANT
PASSER SUT SWANSON 36MM LOOP (INSTRUMENTS) ×2 IMPLANT
POSITIONER SURGICAL ARM (MISCELLANEOUS) ×2 IMPLANT
SET HNDPC FAN SPRY TIP SCT (DISPOSABLE) ×1 IMPLANT
SPONGE LAP 18X18 X RAY DECT (DISPOSABLE) ×2 IMPLANT
SPONGE SURGIFOAM ABS GEL 100 (HEMOSTASIS) ×2 IMPLANT
STAPLER VISISTAT 35W (STAPLE) ×2 IMPLANT
SUCTION FRAZIER HANDLE 10FR (MISCELLANEOUS) ×1
SUCTION TUBE FRAZIER 10FR DISP (MISCELLANEOUS) ×1 IMPLANT
SUT BONE WAX W31G (SUTURE) ×2 IMPLANT
SUT ETHIBOND NAB CT1 #1 30IN (SUTURE) ×2 IMPLANT
SUT STRATAFIX PDO 1 14 VIOLET (SUTURE) ×2
SUT STRATFX PDO 1 14 VIOLET (SUTURE) ×1
SUT VIC AB 0 CTX 27 (SUTURE) ×8 IMPLANT
SUT VIC AB 1 CTX 36 (SUTURE) ×8
SUT VIC AB 1 CTX36XBRD ANBCTR (SUTURE) ×4 IMPLANT
SUT VIC AB 2-0 CTX 36 (SUTURE) ×8 IMPLANT
SUTURE STRATFX PDO 1 14 VIOLET (SUTURE) IMPLANT
TOWEL OR 17X26 10 PK STRL BLUE (TOWEL DISPOSABLE) ×4 IMPLANT
TOWER CARTRIDGE SMART MIX (DISPOSABLE) ×2 IMPLANT
TRAY FOLEY W/METER SILVER 16FR (SET/KITS/TRAYS/PACK) ×1 IMPLANT
WATER STERILE IRR 1000ML POUR (IV SOLUTION) ×2 IMPLANT
YANKAUER SUCT BULB TIP 10FT TU (MISCELLANEOUS) ×2 IMPLANT

## 2017-06-06 NOTE — Anesthesia Postprocedure Evaluation (Signed)
Anesthesia Post Note  Patient: [name]  Procedure(s) Performed: CLOSED REDUCTION HIP (Right Hip)     Patient location during evaluation: PACU Anesthesia Type: General Level of consciousness: awake and alert Pain management: pain level controlled Vital Signs Assessment: post-procedure vital signs reviewed and stable Respiratory status: spontaneous breathing, nonlabored ventilation, respiratory function stable and patient connected to nasal cannula oxygen Cardiovascular status: blood pressure returned to baseline and stable Postop Assessment: no apparent nausea or vomiting Anesthetic complications: no    Last Vitals:  Vitals:   06/05/17 2056 06/05/17 2125  BP: 127/69 113/63  Pulse: 71 71  Resp: 16 (!) 25  Temp: 36.4 C 36.5 C  SpO2: 95% 97%    Last Pain:  Vitals:   06/05/17 2125  TempSrc:   PainSc: 8                  Tiajuana Amass

## 2017-06-06 NOTE — Progress Notes (Signed)
Subjective: 1 Day Post-Op Procedure(s) (LRB): CLOSED REDUCTION HIP (Right) Patient reports pain as 4 on 0-10 scale.Ready fo surgery today. Weak dorsiflexors of Right Foot as when admitted prior to manipulation. Circilation intact.    Objective: Vital signs in last 24 hours: Temp:  [97.3 F (36.3 C)-98.5 F (36.9 C)] 98.5 F (36.9 C) (03/16 0450) Pulse Rate:  [55-77] 64 (03/16 0450) Resp:  [9-25] 20 (03/16 0450) BP: (84-127)/(47-73) 84/47 (03/16 0450) SpO2:  [95 %-100 %] 100 % (03/16 0450) Weight:  [58.5 kg (129 lb)] 58.5 kg (129 lb) (03/15 1800)  Intake/Output from previous day: 03/15 0701 - 03/16 0700 In: 1100 [P.O.:200; I.V.:900] Out: 500 [Urine:500] Intake/Output this shift: No intake/output data recorded.  Recent Labs    06/05/17 1645 06/06/17 0551  HGB 10.7* 10.2*   Recent Labs    06/05/17 1645 06/06/17 0551  WBC 6.4 6.7  RBC 3.67* 3.49*  HCT 32.9* 31.9*  PLT 343 303   Recent Labs    06/05/17 1645  NA 137  K 4.1  CL 102  CO2 25  BUN 18  CREATININE 0.97  GLUCOSE 107*  CALCIUM 8.4*   Recent Labs    06/06/17 0551  INR 1.00    weak dorsiflexors right foot.  Assessment/Plan: 1 Day Post-Op Procedure(s) (LRB): CLOSED REDUCTION HIP (Right) Revision right Hip today.  Kayla [name] 06/06/2017, 7:24 AM

## 2017-06-06 NOTE — Op Note (Signed)
OPERATIVE REPORT   06/06/2017  1:58 PM  PATIENT:  Kayla [name]   SURGEON:  Bertram Savin, MD  ASSISTANT: [name], MD.   PREOPERATIVE DIAGNOSIS: Dislocated right total hip arthroplasty. Recurrent right hip instability.  POSTOPERATIVE DIAGNOSIS:  Same.  PROCEDURE: Revision right total hip arthroplasty with head ball and liner exchange.  ANESTHESIA:   GETA.  ANTIBIOTICS: 2 g Ancef. 1 g vancomycin.  EXPLANTS: DePuy 52 x 36 mm +4 neutral poly-liner. DePuy 36+5 ceramic head ball.  IMPLANTS: DePuy constrained liner, 52 x 36 mm. DePuy 32+1 mm metal head ball.  SPECIMENS: None.  COMPLICATIONS: None.  DISPOSITION: Stable to PACU.  SURGICAL INDICATIONS:  Kayla [name] is a 67 y.o. female who has a history of multiple right hip operations.  Briefly, she had a right hip hemiarthroplasty for displaced femoral neck fracture and underwent subsequent conversion in 2017 by Dr. Alvan Dame for development of osteoarthritis.  2 months postoperatively, she had an episode of dislocation while bending over at the waist.  She has done fairly well until yesterday, when she was bent over and dislocated the hip again.  Closed reduction was attempted in the operating room by Dr. Gladstone Lighter and by myself, and the prosthesis was not reducible.  Therefore, open reduction and revision arthroplasty with head ball and liner exchange was medically necessary.  The risks, benefits, and alternatives were discussed with the patient preoperatively including but not limited to the risks of infection, bleeding, nerve / blood vessel injury, malunion, nonunion, cardiopulmonary complications, the need for repeat surgery, among others, and the patient was willing to proceed.  PROCEDURE IN DETAIL: The patient was identified in the holding area using 2 identifiers.  The surgical site was marked by Dr. Gladstone Lighter.  Patient was taken to the operating room, general anesthesia was induced.  She was then flipped to the  left lateral decubitus position.  All bony prominences were well-padded.  Axillary roll was placed.  She was secured to the table with a pegboard.  The right hip was prepped and draped in the normal sterile surgical fashion.  Timeout was called, verifying site and site of surgery.  Patient did receive IV antibiotics within 60 minutes of beginning the procedure.  I used a #10 blade to sharply excise her previous scar from posterior approach.  Full-thickness skin flaps were created.  I split the IT band in line with the fibers.  Standard posterior approach to the hip was performed.  The sciatic nerve was palpated and protected throughout the case.  Posterior aspect of the trochanter and capsular area was denuded of soft tissue.  The femoral head was located superiorly and posteriorly and was entrapped in the soft tissues.  I delivered the femoral head into the wound.  She had significant striping of the ceramic head.  I used a tamp to remove the head.  There was no damage to the trunnion or femoral neck.  The femoral component was well fixed.  I turned my attention to the acetabulum.  Acetabular retractors were placed.  She had wear of the bumper of the polyethylene liner.  I cleared soft tissue from the interface between the implant and native bone.  Drill bit was used to drill a hole and liner.  6.5 millimeters screw was inserted to loosen the liner.  The liner was removed in one piece.  I examined the cup.  Cup was well fixed.  Preoperative imaging, including CT scan demonstrated adequate position of the cup with excellent anteversion.  Given the excellent position of her components, most likely cause of her recurrent instability is muscle dysfunction.  Therefore, we decided to constrain her.  Real constrained liner was impacted into the cup.  The proximal femur was delivered into the wound.  The locking ring was slid over the trunnion.  The trunnion was cleaned with saline and dried with a clean lap sponge.   The real head ball was impacted into place and the hip was reduced.  The head was seated within the constrained liner.  The locking ring was slid into place.  Stability testing was performed and found to be excellent.  The wound was copiously irrigated with normal saline.  The wound was closed in layers with #1 Vicryl and #1 strata fix for the IT band.  Deep fatty tissue was closed with 2-0 Vicryl sutures.  Deep dermal layer was closed with 2-0 Vicryl sutures.  Skin was closed with 4-0 Monocryl subcuticular stitch.  Aquacel dressing was applied.  Patient was then awakened from anesthesia, and transferred to the PACU in stable condition.  Sponge, needle, and instrument counts were correct at the end of the case x2.  There were no known complications.  Postoperatively, the patient will be readmitted to the hospitalist.  She may weight-bear as tolerated right lower extremity with a walker.  Posterior hip precautions.  Begin apixaban for DVT prophylaxis.  She will work with physical therapy and undergo disposition planning.  She will return to the office in 2 weeks for routine postoperative care.

## 2017-06-06 NOTE — Anesthesia Procedure Notes (Signed)
Procedure Name: Intubation Date/Time: 06/06/2017 12:44 PM Performed by: Claudia Desanctis, CRNA Pre-anesthesia Checklist: Patient identified, Emergency Drugs available, Suction available and Patient being monitored Patient Re-evaluated:Patient Re-evaluated prior to induction Oxygen Delivery Method: Circle system utilized Preoxygenation: Pre-oxygenation with 100% oxygen Induction Type: IV induction Ventilation: Mask ventilation without difficulty Laryngoscope Size: 2 and Miller Grade View: Grade I Tube type: Oral Tube size: 6.5 mm Number of attempts: 1 Airway Equipment and Method: Stylet Placement Confirmation: ETT inserted through vocal cords under direct vision,  positive ETCO2 and breath sounds checked- equal and bilateral Secured at: 21 cm Tube secured with: Tape Dental Injury: Teeth and Oropharynx as per pre-operative assessment

## 2017-06-06 NOTE — Progress Notes (Signed)
Transferred to OR via bed for surgical proceedure. Smokey Melott, [employer]

## 2017-06-06 NOTE — Interval H&P Note (Signed)
History and Physical Interval Note:  06/06/2017 12:27 PM  Kayla [name]  has presented today for surgery, with the diagnosis of DISLOCATED RIGHT HIP  The various methods of treatment have been discussed with the patient and family. After consideration of risks, benefits and other options for treatment, the patient has consented to  Procedure(s): TOTAL HIP REVISION (Right) as a surgical intervention .  [name] history has been reviewed, patient examined, no change in status, stable for surgery.  I have reviewed the patient's chart and labs.  Questions were answered to the patient's satisfaction.     [name]

## 2017-06-06 NOTE — Anesthesia Preprocedure Evaluation (Signed)
Anesthesia Evaluation  Patient identified by MRN, date of birth, ID band Patient awake    Reviewed: Allergy & Precautions, NPO status , Patient's Chart, lab work & pertinent test results  Airway Mallampati: II  TM Distance: >3 FB Neck ROM: full    Dental   Pulmonary shortness of breath, asthma , sleep apnea , COPD,    breath sounds clear to auscultation       Cardiovascular negative cardio ROS   Rhythm:regular Rate:Normal     Neuro/Psych MS  Neuromuscular disease    GI/Hepatic negative GI ROS, Neg liver ROS,   Endo/Other  Hypothyroidism   Renal/GU negative Renal ROS     Musculoskeletal  (+) Arthritis ,   Abdominal   Peds  Hematology  (+) anemia ,   Anesthesia Other Findings   Reproductive/Obstetrics                             Anesthesia Physical  Anesthesia Plan  ASA: III  Anesthesia Plan: General   Post-op Pain Management:    Induction: Intravenous  PONV Risk Score and Plan: 2 and Ondansetron, Propofol infusion and Treatment may vary due to age or medical condition  Airway Management Planned: LMA and Oral ETT  Additional Equipment:   Intra-op Plan:   Post-operative Plan: Extubation in OR  Informed Consent: I have reviewed the patients History and Physical, chart, labs and discussed the procedure including the risks, benefits and alternatives for the proposed anesthesia with the patient or authorized representative who has indicated his/her understanding and acceptance.     Plan Discussed with: CRNA, Anesthesiologist and Surgeon  Anesthesia Plan Comments:         Anesthesia Quick Evaluation

## 2017-06-06 NOTE — Transfer of Care (Signed)
Immediate Anesthesia Transfer of Care Note  Patient: Kayla [name]  Procedure(s) Performed: TOTAL HIP REVISION (Right Hip)  Patient Location: PACU  Anesthesia Type:General  Level of Consciousness: awake, alert , oriented and patient cooperative  Airway & Oxygen Therapy: Patient Spontanous Breathing and Patient connected to nasal cannula oxygen  Post-op Assessment: Report given to RN and Post -op Vital signs reviewed and stable  Post vital signs: Reviewed and stable  Last Vitals:  Vitals:   06/05/17 2125 06/06/17 0450  BP: 113/63 (!) 84/47  Pulse: 71 64  Resp: (!) 25 20  Temp: 36.5 C 36.9 C  SpO2: 97% 100%    Last Pain:  Vitals:   06/06/17 1150  TempSrc:   PainSc: 6       Patients Stated Pain Goal: 3 (59/74/16 3845)  Complications: No apparent anesthesia complications

## 2017-06-06 NOTE — Anesthesia Postprocedure Evaluation (Signed)
Anesthesia Post Note  Patient: Kayla [name]  Procedure(s) Performed: TOTAL HIP REVISION (Right Hip)     Patient location during evaluation: PACU Anesthesia Type: General Level of consciousness: awake and alert Pain management: pain level controlled Vital Signs Assessment: post-procedure vital signs reviewed and stable Respiratory status: spontaneous breathing, nonlabored ventilation, respiratory function stable and patient connected to nasal cannula oxygen Cardiovascular status: blood pressure returned to baseline and stable Postop Assessment: no apparent nausea or vomiting Anesthetic complications: no    Last Vitals:  Vitals:   06/06/17 1627 06/06/17 1638  BP: (!) 94/35 (!) 104/43  Pulse: 69 69  Resp: 16   Temp: (!) 36.4 C 36.6 C  SpO2: 97% 99%    Last Pain:  Vitals:   06/06/17 1638  TempSrc: Oral  PainSc:                  Litha Lamartina

## 2017-06-06 NOTE — Progress Notes (Signed)
Patient received from PACU, skin warm and dry, alert and oriented by in significant pain.  Transferred to bed from stretcher.  Ice to right hip.  Foley catheter inserted.  Oriented to room.   Family at bedside.

## 2017-06-07 ENCOUNTER — Encounter (HOSPITAL_COMMUNITY): Payer: Self-pay | Admitting: Orthopedic Surgery

## 2017-06-07 DIAGNOSIS — S73004A Unspecified dislocation of right hip, initial encounter: Secondary | ICD-10-CM

## 2017-06-07 DIAGNOSIS — I959 Hypotension, unspecified: Secondary | ICD-10-CM

## 2017-06-07 LAB — BASIC METABOLIC PANEL
Anion gap: 6 (ref 5–15)
BUN: 11 mg/dL (ref 6–20)
CO2: 28 mmol/L (ref 22–32)
Calcium: 8.3 mg/dL — ABNORMAL LOW (ref 8.9–10.3)
Chloride: 104 mmol/L (ref 101–111)
Creatinine, Ser: 0.67 mg/dL (ref 0.44–1.00)
GFR calc Af Amer: >60 mL/min (ref >60)
GFR calc non Af Amer: >60 mL/min (ref >60)
Glucose, Bld: 93 mg/dL (ref 65–99)
Potassium: 4.2 mmol/L (ref 3.5–5.1)
Sodium: 138 mmol/L (ref 135–145)

## 2017-06-07 LAB — CBC
HCT: 29.2 % — ABNORMAL LOW (ref 36.0–46.0)
Hemoglobin: 9.5 g/dL — ABNORMAL LOW (ref 12.0–15.0)
MCH: 29.8 pg (ref 26.0–34.0)
MCHC: 32.5 g/dL (ref 30.0–36.0)
MCV: 91.5 fL (ref 78.0–100.0)
Platelets: 275 10*3/uL (ref 150–400)
RBC: 3.19 MIL/uL — ABNORMAL LOW (ref 3.87–5.11)
RDW: 14.6 % (ref 11.5–15.5)
WBC: 7.9 10*3/uL (ref 4.0–10.5)

## 2017-06-07 LAB — LACTIC ACID, PLASMA: Lactic Acid, Venous: 1.7 mmol/L (ref 0.5–1.9)

## 2017-06-07 MED ORDER — SODIUM CHLORIDE 0.9 % IV BOLUS (SEPSIS)
500.0000 mL | Freq: Once | INTRAVENOUS | Status: AC
  Administered 2017-06-07: 500 mL via INTRAVENOUS

## 2017-06-07 MED ORDER — ALBUTEROL SULFATE (2.5 MG/3ML) 0.083% IN NEBU: 2.5000 mg | INHALATION_SOLUTION | RESPIRATORY_TRACT | Status: DC | PRN

## 2017-06-07 MED ORDER — SODIUM CHLORIDE 0.9 % IV SOLN
INTRAVENOUS | Status: AC
  Administered 2017-06-07 – 2017-06-08 (×2): via INTRAVENOUS

## 2017-06-07 NOTE — Progress Notes (Signed)
Physical Therapy Treatment Patient Details Name: Kayla [name] MRN: [id number] DOB: 11-15-1950 Today's Date: 06/07/2017    History of Present Illness 67 y.o. female with h/o MS, R humeral head fx August 2018, R hip hemiarthroplasty June 2017 admitted for R hip dislocation, s/p revision with posterior precautions.     PT Comments    Assisted pt to transfer bed to recliner. R hip/ankle pain limits activity tolerance. BP 96/43 supine, no dizziness with activity.   Follow Up Recommendations  Follow surgeon's recommendation for DC plan and follow-up therapies;Home health PT(pt/spouse stated they can have 24* assist at home)     Equipment Recommendations  None recommended by PT    Recommendations for Other Services       Precautions / Restrictions Precautions Precautions: Posterior Hip Precaution Booklet Issued: Yes (comment) Precaution Comments: reviewed precautions with pt/spouse Restrictions RLE Weight Bearing: Weight bearing as tolerated    Mobility  Bed Mobility Overal bed mobility: Needs Assistance Bed Mobility: Supine to Sit;Sit to Supine     Supine to sit: Mod assist Sit to supine: Max assist;+2 for physical assistance   General bed mobility comments: assist to advance RLE and raise trunk  Transfers Overall transfer level: Needs assistance Equipment used: Rolling walker (2 wheeled) Transfers: Sit to/from [facility] Sit to Stand: Min assist Stand pivot transfers: Min assist       General transfer comment: min A to rise and steady, increased time 2* pain  Ambulation/Gait             General Gait Details: deferred 2* pain   Stairs            Wheelchair Mobility    Modified Rankin (Stroke Patients Only)       Balance Overall balance assessment: Needs assistance Sitting-balance support: Feet supported;Single extremity supported Sitting balance-Leahy Scale: Good     Standing balance support: Bilateral upper extremity  supported Standing balance-Leahy Scale: Poor                              Cognition Arousal/Alertness: Awake/alert Behavior During Therapy: WFL for tasks assessed/performed Overall Cognitive Status: Within Functional Limits for tasks assessed                                        Exercises Total Joint Exercises Ankle Circles/Pumps: AROM;Both;10 reps;Supine Quad Sets: AROM;Both;10 reps;Supine Heel Slides: AAROM;Right;10 reps;Supine Hip ABduction/ADduction: AAROM;Right;10 reps;Supine    General Comments General comments (skin integrity, edema, etc.): seated BP 101/48 after sitting at edge of bed x 5 min, pt denied dizziness throughout session, pt reports h/o chronic hypotension "it runs in my family"      Pertinent Vitals/Pain Pain Score: 5  Pain Location: R hip and R ankle ("from where they were pulling trying to pop my hip back in") Pain Descriptors / Indicators: Sore Pain Intervention(s): Limited activity within patient's tolerance;Monitored during session;Premedicated before session    Home Living                      Prior Function            PT Goals (current goals can now be found in the care plan section) Acute Rehab PT Goals Patient Stated Goal: to walk PT Goal Formulation: With patient/family Time For Goal Achievement: 06/21/17 Potential to Achieve Goals: Good  Progress towards PT goals: Progressing toward goals    Frequency    7X/week      PT Plan Current plan remains appropriate    Co-evaluation              AM-PAC PT "6 Clicks" Daily Activity  Outcome Measure  Difficulty turning over in bed (including adjusting bedclothes, sheets and blankets)?: Unable Difficulty moving from lying on back to sitting on the side of the bed? : Unable Difficulty sitting down on and standing up from a chair with arms (e.g., wheelchair, bedside commode, etc,.)?: Unable Help needed moving to and from a bed to chair (including  a wheelchair)?: A Little Help needed walking in hospital room?: A Lot Help needed climbing 3-5 steps with a railing? : Total 6 Click Score: 9    End of Session Equipment Utilized During Treatment: Right knee immobilizer;Gait belt Activity Tolerance: Patient limited by pain;Patient limited by fatigue Patient left: with family/visitor present;in chair;with call bell/phone within reach Nurse Communication: Mobility status PT Visit Diagnosis: Muscle weakness (generalized) (M62.81);Difficulty in walking, not elsewhere classified (R26.2);Pain Pain - Right/Left: Right Pain - part of body: Hip     Time: 1322-1340 PT Time Calculation (min) (ACUTE ONLY): 18 min  Charges:  $Therapeutic Activity: 8-22 mins                    G Codes:          Philomena Doheny 06/07/2017, 1:48 PM [phone]

## 2017-06-07 NOTE — Progress Notes (Signed)
Patient refused for foley cathter to be removed until tomorrow. States "I can barely move, and they didn't even put it in until I got back from surgery last night."  Patient also states she does not want to be bothered between 10pm, and 6am so that "I can get some sleep." Patient and family informed that we will be checking in on her during the night, but will do our best not to wake her unless necessary. Patient was also informed that lab comes between 4am and 6am to draw labs and have them available for the MD rounding in the morning.

## 2017-06-07 NOTE — Evaluation (Signed)
Physical Therapy Evaluation Patient Details Name: Kayla [name] MRN: [id number] DOB: 1950/06/23 Today's Date: 06/07/2017   History of Present Illness  67 y.o. female with h/o MS, R humeral head fx August 2018, R hip hemiarthroplasty June 2017 admitted for R hip dislocation, s/p revision with posterior precautions.   Clinical Impression  Pt admitted with above diagnosis. Pt currently with functional limitations due to the deficits listed below (see PT Problem List).  Supine to sit with +2 max assist, pt sat at edge of bed x 8 minutes. BP 99/43 supine, 89/44 sitting, then 101/48 after sitting 5 minutes, pt asymptomatic. Transfer deferred 2* hypotension. Reviewed posterior precautions and instructed pt in HEP.  Pt will benefit from skilled PT to increase their independence and safety with mobility to allow discharge to the venue listed below.       Follow Up Recommendations Follow surgeon's recommendation for DC plan and follow-up therapies;SNF(may need ST-SNF depending on progress)    Equipment Recommendations  None recommended by PT    Recommendations for Other Services       Precautions / Restrictions Precautions Precautions: Posterior Hip Precaution Booklet Issued: Yes (comment) Precaution Comments: reviewed precautions with pt/spouse Restrictions RLE Weight Bearing: Weight bearing as tolerated      Mobility  Bed Mobility Overal bed mobility: Needs Assistance Bed Mobility: Supine to Sit;Sit to Supine     Supine to sit: +2 for physical assistance;Max assist Sit to supine: Max assist;+2 for physical assistance   General bed mobility comments: assist to advance RLE and raise trunk  Transfers                 General transfer comment: deferred 2* hypotension  Ambulation/Gait                Stairs            Wheelchair Mobility    Modified Rankin (Stroke Patients Only)       Balance Overall balance assessment: Needs assistance Sitting-balance  support: Feet supported;Single extremity supported Sitting balance-Leahy Scale: Fair                                       Pertinent Vitals/Pain Pain Assessment: 0-10 Pain Score: 4  Pain Location: R hip Pain Descriptors / Indicators: Sore Pain Intervention(s): Limited activity within patient's tolerance;Monitored during session;Premedicated before session    Home Living Family/patient expects to be discharged to:: Private residence Living Arrangements: Spouse/significant other Available Help at Discharge: Family(husband works, has 24* assist available) Type of Home: House Home Access: Stairs to enter Entrance Stairs-Rails: Psychiatric nurse of Steps: 4 Home Layout: One level Home Equipment: Cane - single point;Walker - 4 wheels;Transport chair;Walker - 2 wheels;Bedside commode;Shower seat      Prior Function Level of Independence: Independent with assistive device(s)         Comments: walked with cane, independent ADLs     Hand Dominance        Extremity/Trunk Assessment   Upper Extremity Assessment Upper Extremity Assessment: Defer to OT evaluation(recent R humeral head fx August 2018)    Lower Extremity Assessment Lower Extremity Assessment: RLE deficits/detail RLE Deficits / Details: R foot internally rotates at baseline per pt (h/o MS), assessment limited by pain and hip precautions but pt able to assist with heel slide, independent with ankle pumps RLE: Unable to fully assess due to pain RLE Sensation: WNL LLE Deficits /  Details: knee ext 4/5       Communication   Communication: No difficulties  Cognition Arousal/Alertness: Awake/alert Behavior During Therapy: WFL for tasks assessed/performed Overall Cognitive Status: Within Functional Limits for tasks assessed                                        General Comments General comments (skin integrity, edema, etc.): seated BP 101/48 after sitting at edge of  bed x 5 min, pt denied dizziness throughout session, pt reports h/o chronic hypotension "it runs in my family"    Exercises Total Joint Exercises Ankle Circles/Pumps: AROM;Both;10 reps;Supine Quad Sets: AROM;Both;10 reps;Supine Heel Slides: AAROM;Right;10 reps;Supine Hip ABduction/ADduction: AAROM;Right;10 reps;Supine   Assessment/Plan    PT Assessment Patient needs continued PT services  PT Problem List Decreased strength;Decreased activity tolerance;Decreased balance;Cardiopulmonary status limiting activity;Decreased mobility;Pain       PT Treatment Interventions DME instruction;Gait training;Therapeutic activities;Functional mobility training;Stair training;Therapeutic exercise;Balance training;Patient/family education    PT Goals (Current goals can be found in the Care Plan section)  Acute Rehab PT Goals Patient Stated Goal: to walk PT Goal Formulation: With patient/family Time For Goal Achievement: 06/21/17 Potential to Achieve Goals: Good    Frequency 7X/week   Barriers to discharge        Co-evaluation               AM-PAC PT "6 Clicks" Daily Activity  Outcome Measure Difficulty turning over in bed (including adjusting bedclothes, sheets and blankets)?: Unable Difficulty moving from lying on back to sitting on the side of the bed? : Unable Difficulty sitting down on and standing up from a chair with arms (e.g., wheelchair, bedside commode, etc,.)?: Unable Help needed moving to and from a bed to chair (including a wheelchair)?: Total Help needed walking in hospital room?: Total Help needed climbing 3-5 steps with a railing? : Total 6 Click Score: 6    End of Session Equipment Utilized During Treatment: Right knee immobilizer Activity Tolerance: Patient limited by pain;Patient limited by fatigue Patient left: in bed;with family/visitor present Nurse Communication: Mobility status PT Visit Diagnosis: Muscle weakness (generalized) (M62.81);Difficulty in  walking, not elsewhere classified (R26.2);Pain Pain - Right/Left: Right Pain - part of body: Hip    Time: 0933-1000 PT Time Calculation (min) (ACUTE ONLY): 27 min   Charges:   PT Evaluation $PT Eval Low Complexity: 1 Low PT Treatments $Therapeutic Activity: 8-22 mins   PT G Codes:          Kayla [name] 06/07/2017, 10:12 AM [phone]

## 2017-06-07 NOTE — Consult Note (Signed)
Medical Consultation   Kayla [name]  ZOX:[id number]  DOB: 1951-02-12  DOA: 06/05/2017  PCP: Tanda Rockers, MD   Outpatient Specialists: Dr Gladstone Lighter with orthopedics    Requesting physician: Dr Gladstone Lighter with orthopedics   Reason for consultation: Hypotension.  History of Present Illness: Kayla [name] is an 67 y.o. female with prior h/o sleep apnea, hypothyroidism, COPD, on RA, Multiple sclerosis, h/o total right hip arthroplasty, presented to ED for dislocation of the right hip , underwent a revision of total hip arthroplasty with head ball liner exchange, on 3.16 by Dr Lyla Glassing has been having episodes of hypotension since last night as reported by RN. As per the patient, she denies having dizziness, palpitations, headache, chest pain, sob, pre syncope, lightheadedness. Her bp last night has been in 80/40's , which have responded to the fluid bolus given this am. Her bp at this time is 95/47'. She worked with PT today and was able to sit up at the edge of the bed. She denies any nausea, vomiting, abdominal pain or diarrhea.no headache, dizziness , denies any gross motor or sensory deficits. TRH consulted for evaluation of hypotension.    Review of Systems: As per HPI otherwise 10 point review of systems negative.     Past Medical History: Past Medical History:  Diagnosis Date  . Arthritis    oa  . Cervical cancer ([place]) 2005  . COPD (chronic obstructive pulmonary disease) ([place])   . Multiple sclerosis ([place])   . Osteoporosis   . Other diseases of vocal cords    vocal cords are not even  . Other voice and resonance disorders   . Pneumonia 2014, 1992  . Sleep apnea    no cpap used  . Unspecified hypothyroidism     Past Surgical History: Past Surgical History:  Procedure Laterality Date  . ABDOMINAL HYSTERECTOMY  2005   for cervical cancer, complete  . CONVERSION TO TOTAL HIP Right 09/18/2015   Procedure: CONVERSION OF RIGHT HEMI ARTHROPLASTY TO TOTAL HIP  ARTHROPLASTY ACETABULAR REVISON ;  Surgeon: Paralee Cancel, MD;  Location: [facility];  Service: Orthopedics;  Laterality: Right;  . EYE SURGERY Bilateral    lens for cataracts  . HIP ARTHROPLASTY Right 03/01/2013   Procedure: ARTHROPLASTY BIPOLAR HIP;  Surgeon: Mauri Pole, MD;  Location: [facility];  Service: Orthopedics;  Laterality: Right;  . HIP CLOSED REDUCTION Right 03/12/2013   Procedure: CLOSED REDUCTION HIP;  Surgeon: Gearlean Alf, MD;  Location: [place];  Service: Orthopedics;  Laterality: Right;  . HIP CLOSED REDUCTION Right 03/19/2013   Procedure: CLOSED REDUCTION HIP;  Surgeon: Johnn Hai, MD;  Location: [place];  Service: Orthopedics;  Laterality: Right;  . HIP CLOSED REDUCTION Right 11/10/2015   Procedure: CLOSED MANIPULATION HIP;  Surgeon: Rod Can, MD;  Location: [facility];  Service: Orthopedics;  Laterality: Right;  . HIP CLOSED REDUCTION Right 06/05/2017   Procedure: CLOSED REDUCTION HIP;  Surgeon: [name], MD;  Location: [facility];  Service: Orthopedics;  Laterality: Right;  Marland Kitchen VESICOVAGINAL FISTULA CLOSURE W/ TAH  2005     Allergies:   Allergies  Allergen Reactions  . Tramadol     hallucinations   . Iohexol Hives  . Methylprednisolone Other (See Comments)    Reaction:  Decreases pts heart rate   . Morphine And Related Other (See Comments)    Reaction:  Hallucinations   . Oxycodone-Acetaminophen Rash  and Other (See Comments)    Reaction:  Hallucinations   . Phenytoin Sodium Extended Rash  . Zanaflex [Tizanidine Hydrochloride] Rash     Social History:  reports that  has never smoked. she has never used smokeless tobacco. She reports that she drinks alcohol. She reports that she does not use drugs.   Family History: Family History  Problem Relation Age of Onset  . Colon cancer Father     Reviewed.    Physical Exam: Vitals:   06/07/17 0940 06/07/17 0945 06/07/17 0954 06/07/17 1002  BP: (!) 99/43 (!) 89/44 (!) 101/48 (!) 98/41  Pulse:    80    Resp:    15  Temp:    98.7 F (37.1 C)  TempSrc:    Oral  SpO2:    98%  Weight:      Height:        Constitutional: Appearance,  Alert and awake, oriented x3, not in any acute distress. Eyes: PERLA, EOMI, irises appear normal, anicteric sclera,  ENMT: external ears and nose appear normal,  Neck: neck appears normal, no masses, normal ROM, no thyromegaly, no JVD  CVS: S1-S2 clear, no murmur rubs or gallops, no LE edema, normal pedal pulses  Respiratory:  clear to auscultation bilaterally, no wheezing, rales or rhonchi. Respiratory effort normal. No accessory muscle use.  Abdomen: soft nontender, nondistended, normal bowel sounds, no hepatosplenomegaly, no hernias  Musculoskeletal: : right lower extremity in bandage. Neuro: Cranial nerves II-XII intact, strength, sensation, reflexes Psych: judgement and insight appear normal, stable mood and affect, mental status Skin: no rashes or lesions or ulcers, no induration or nodules     Data reviewed:  I have personally reviewed following labs and imaging studies Labs:  CBC: Recent Labs  Lab 06/05/17 1645 06/06/17 0551 06/07/17 0535  WBC 6.4 6.7 7.9  NEUTROABS 4.7  --   --   HGB 10.7* 10.2* 9.5*  HCT 32.9* 31.9* 29.2*  MCV 89.6 91.4 91.5  PLT 343 303 778    Basic Metabolic Panel: Recent Labs  Lab 06/05/17 1645 06/06/17 0551 06/07/17 0535  NA 137 137 138  K 4.1 4.1 4.2  CL 102 102 104  CO2 25 24 28   GLUCOSE 107* 73 93  BUN 18 15 11   CREATININE 0.97 0.76 0.67  CALCIUM 8.4* 8.2* 8.3*   GFR Estimated Creatinine Clearance: 58.8 mL/min (by C-G formula based on SCr of 0.67 mg/dL). Liver Function Tests: Recent Labs  Lab 06/05/17 1645 06/06/17 0551  AST 23 25  ALT 11* 8*  ALKPHOS 50 44  BILITOT 0.7 0.9  PROT 6.0* 5.0*  ALBUMIN 3.7 3.1*   No results for input(s): LIPASE, AMYLASE in the last 168 hours. No results for input(s): AMMONIA in the last 168 hours. Coagulation profile Recent Labs  Lab 06/06/17 0551  INR  1.00    Cardiac Enzymes: No results for input(s): CKTOTAL, CKMB, CKMBINDEX, TROPONINI in the last 168 hours. BNP: Invalid input(s): POCBNP CBG: Recent Labs  Lab 06/06/17 1446  GLUCAP 95   D-Dimer No results for input(s): DDIMER in the last 72 hours. Hgb A1c No results for input(s): HGBA1C in the last 72 hours. Lipid Profile No results for input(s): CHOL, HDL, LDLCALC, TRIG, CHOLHDL, LDLDIRECT in the last 72 hours. Thyroid function studies No results for input(s): TSH, T4TOTAL, T3FREE, THYROIDAB in the last 72 hours.  Invalid input(s): FREET3 Anemia work up No results for input(s): VITAMINB12, FOLATE, FERRITIN, TIBC, IRON, RETICCTPCT in the last 72 hours. Urinalysis  Component Value Date/Time   COLORURINE YELLOW 05/14/2017 1029   APPEARANCEUR Sl Cloudy (A) 05/14/2017 1029   LABSPEC 1.010 05/14/2017 1029   PHURINE 6.5 05/14/2017 1029   GLUCOSEU NEGATIVE 05/14/2017 1029   HGBUR SMALL (A) 05/14/2017 1029   BILIRUBINUR NEGATIVE 05/14/2017 1029   KETONESUR NEGATIVE 05/14/2017 1029   PROTEINUR NEGATIVE 11/07/2016 2359   UROBILINOGEN 0.2 05/14/2017 1029   NITRITE NEGATIVE 05/14/2017 1029   LEUKOCYTESUR LARGE (A) 05/14/2017 1029     Microbiology Recent Results (from the past 240 hour(s))  Surgical pcr screen     Status: None   Collection Time: 06/06/17  9:20 AM  Result Value Ref Range Status   MRSA, PCR NEGATIVE NEGATIVE Final   Staphylococcus aureus NEGATIVE NEGATIVE Final    Comment: (NOTE) The Xpert SA Assay (FDA approved for NASAL specimens in patients 30 years of age and older), is one component of a comprehensive surveillance program. It is not intended to diagnose infection nor to guide or monitor treatment. Performed at [facility], [place] [address], [place], [place] [address]        Inpatient Medications:   Scheduled Meds: . acetaminophen  650 mg Oral Q6H  . albuterol  2.5 mg Inhalation TID  . amphetamine-dextroamphetamine  20 mg  Oral BID AC  . apixaban  2.5 mg Oral Q12H  . baclofen  20 mg Oral TID  . docusate sodium  100 mg Oral BID  . ferrous sulfate  325 mg Oral TID PC  . FLUoxetine  40 mg Oral Daily  . gabapentin  600 mg Oral BID  . levothyroxine  100 mcg Oral QAC breakfast  . mometasone-formoterol  2 puff Inhalation BID  . multivitamin with minerals  1 tablet Oral Daily  . pantoprazole  40 mg Oral Daily  . predniSONE  10 mg Oral Q breakfast  . sodium chloride flush  3 mL Intravenous Q12H  . temazepam  15 mg Oral QHS   Continuous Infusions: . sodium chloride 1,000 mL (06/07/17 0413)  . methocarbamol (ROBAXIN)  IV Stopped (06/06/17 1523)  . sodium chloride 500 mL (06/07/17 1021)     Radiological Exams on Admission: Ct Hip Right Wo Contrast  Result Date: 06/05/2017 CLINICAL DATA:  Right hip dislocation.  Unable to reduce. EXAM: CT OF THE RIGHT HIP WITHOUT CONTRAST TECHNIQUE: Multidetector CT imaging of the right hip was performed according to the standard protocol. Multiplanar CT image reconstructions were also generated. COMPARISON:  11/11/2016 CT FINDINGS: Bones/Joint/Cartilage Cephalad dislocation of the patient's right prosthetic femoral head relative to the acetabular component. No hardware failure with respect to the included right total hip arthroplasty. No fracture is identified. No impeding intra-articular loose body is identified of the right hip. No definite abnormality within the acetabular cup. No loosening of hardware is seen. The acetabular rim is maintained. No soft tissue mass or joint effusion. Ligaments Suboptimally assessed by CT. Muscles and Tendons Negative Soft tissues Coarse subcutaneous calcifications overlie the gluteal muscles bilaterally. There is physiologic distention of the urinary bladder without focal mural thickening. There is lower lumbar degenerative disc disease of the included lower lumbar spine at L4-5 and L5-S1. IMPRESSION: 1. Cephalad dislocation of right femoral head  prosthesis relative to the acetabular component without impeding structures noted within the acetabular cup. No acute periprosthetic fracture. 2. Coarse dystrophic soft tissue calcifications possibly related to old remote injury overlies the included gluteal musculature. Electronically Signed   By: Ashley Royalty M.D.   On: 06/05/2017 22:10  Dg Chest Port 1 View  Result Date: 06/05/2017 CLINICAL DATA:  Hypoxia. EXAM: PORTABLE CHEST 1 VIEW COMPARISON:  Radiographs of September 24, 2015. FINDINGS: The heart size and mediastinal contours are within normal limits. No pneumothorax or pleural effusion is noted. Right lung is clear. Mild left basilar subsegmental atelectasis is noted. The visualized skeletal structures are unremarkable. IMPRESSION: Mild left basilar subsegmental atelectasis. Electronically Signed   By: Marijo Conception, M.D.   On: 06/05/2017 16:21   Dg C-arm 1-60 Min-no Report  Result Date: 06/05/2017 Fluoroscopy was utilized by the requesting physician.  No radiographic interpretation.   Dg Hip Port Unilat With Pelvis 1v Right  Result Date: 06/06/2017 CLINICAL DATA:  RIGHT hip revision EXAM: DG HIP (WITH OR WITHOUT PELVIS) 1V PORT RIGHT COMPARISON:  Radiograph 06/05/2016, CT 11/11/2016 FINDINGS: Interval relocation of RIGHT hip prosthetic. No fracture or dislocation. LEFT hip is located. Dystrophic calcifications in the subcutaneous tissue of the LEFT and RIGHT buttocks noted. IMPRESSION: Successful reduction of RIGHT femoral prosthetic dislocation. Electronically Signed   By: Suzy Bouchard M.D.   On: 06/06/2017 15:31   Dg Hip Unilat With Pelvis 2-3 Views Right  Result Date: 06/05/2017 CLINICAL DATA:  Right hip pain after fall and injury. EXAM: DG HIP (WITH OR WITHOUT PELVIS) 2-3V RIGHT COMPARISON:  Radiographs of November 10, 2015. FINDINGS: Superior dislocation of right femoral prosthesis relative to acetabular prosthesis is noted. No definite fracture or dislocation is noted. IMPRESSION:  Superior dislocation of right femoral prosthesis. Electronically Signed   By: Marijo Conception, M.D.   On: 06/05/2017 16:23    Impression/Recommendations Active Problems:   Failed total hip arthroplasty with dislocation (HCC)   Hypotension: Asymptomatic.   Differential include hypotension from narcotics/ anesthesia vs autonomic dysfunction vs infection vs adrenal insufficiency vs dehydration. So far no signs of infection. Minimize narcotic use.  She has appropriately responded to fluid boluses. So its probably from dehydration. Use maintenance fluids for the next 24 hours. Watch for fever.  Get lactic acid.  Get orthostatic vital signs in when starts working with PT. Pt reports her basline bp runs around 100/60's.    Anemia Anemia of chronic disease. Baseline hemoglobin around 10, watch for anemia of blood loss.    COPD; Pt on RA and no wheezing heard.  Inhalers as needed.    Dislocation of right hip arthroplasty: - s/p revision surgery on 3/17.  - pain control and PT.  - further management as per orthopedics.  - on eliquis for anti coagulation.   Thank you for this consultation.  Our [facility] hospitalist team will follow the patient with you.   Time Spent: 63 minutes  Hosie Poisson M.D. [facility] 06/07/2017, 10:30 AM

## 2017-06-07 NOTE — Progress Notes (Signed)
OT Cancellation Note  Patient Details Name: Kayla [name] MRN: [id number] DOB: 08-08-1950   Cancelled Treatment:    Reason Eval/Treat Not Completed: Patient not medically ready Pt currently with BP 91/32 supine and Rn requesting therapy to hold at this time. OT to hold evaluation at this time pending increased DBP >50. OT to follow closely for appropriateness for evaluation.   Peri Maris  [phone] 06/07/2017, 8:24 AM

## 2017-06-07 NOTE — Progress Notes (Signed)
PT Cancellation Note  Patient Details Name: SERYNA MAREK MRN: [id number] DOB: May 30, 1950   Cancelled Treatment:    Reason Eval/Treat Not Completed: Medical issues which prohibited therapy(holding PT per RN request 2* hypotension. Will check back later today.)   Philomena Doheny 06/07/2017, 7:22 AM [phone]

## 2017-06-07 NOTE — Progress Notes (Signed)
Subjective: 1 Day Post-Op Procedure(s) (LRB): TOTAL HIP REVISION (Right) Patient reports pain as 2 on 0-10 scale.Xray shows that her hip is fine. Main problem is Hpotension. Her Hbg is stable 9.5. Will get Hospitalist evaluate her.     Objective: Vital signs in last 24 hours: Temp:  [97.5 F (36.4 C)-98.9 F (37.2 C)] 98.8 F (37.1 C) (03/17 0003) Pulse Rate:  [60-78] 67 (03/17 0406) Resp:  [10-16] 16 (03/17 0406) BP: (89-115)/(32-62) 91/32 (03/17 0650) SpO2:  [95 %-100 %] 100 % (03/17 0817)  Intake/Output from previous day: 03/16 0701 - 03/17 0700 In: 1703.3 [P.O.:420; I.V.:1178.3; IV Piggyback:105] Out: 960 [Urine:710; Blood:250] Intake/Output this shift: No intake/output data recorded.  Recent Labs    06/05/17 1645 06/06/17 0551 06/07/17 0535  HGB 10.7* 10.2* 9.5*   Recent Labs    06/06/17 0551 06/07/17 0535  WBC 6.7 7.9  RBC 3.49* 3.19*  HCT 31.9* 29.2*  PLT 303 275   Recent Labs    06/06/17 0551 06/07/17 0535  NA 137 138  K 4.1 4.2  CL 102 104  CO2 24 28  BUN 15 11  CREATININE 0.76 0.67  GLUCOSE 73 93  CALCIUM 8.2* 8.3*   Recent Labs    06/06/17 0551  INR 1.00    Dorsiflexion/Plantar flexion intact No cellulitis present  Assessment/Plan: 1 Day Post-Op Procedure(s) (LRB): TOTAL HIP REVISION (Right) Up with therapy.Will get Hospitalist evaluate her.  [name] 06/07/2017, 9:29 AM

## 2017-06-08 ENCOUNTER — Inpatient Hospital Stay (HOSPITAL_COMMUNITY): Payer: BLUE CROSS/BLUE SHIELD

## 2017-06-08 ENCOUNTER — Encounter (HOSPITAL_COMMUNITY): Payer: Self-pay | Admitting: Orthopedic Surgery

## 2017-06-08 DIAGNOSIS — G35 Multiple sclerosis: Secondary | ICD-10-CM

## 2017-06-08 DIAGNOSIS — T84028A Dislocation of other internal joint prosthesis, initial encounter: Secondary | ICD-10-CM

## 2017-06-08 DIAGNOSIS — M25571 Pain in right ankle and joints of right foot: Secondary | ICD-10-CM

## 2017-06-08 DIAGNOSIS — E038 Other specified hypothyroidism: Secondary | ICD-10-CM

## 2017-06-08 DIAGNOSIS — Z419 Encounter for procedure for purposes other than remedying health state, unspecified: Secondary | ICD-10-CM

## 2017-06-08 DIAGNOSIS — Z96649 Presence of unspecified artificial hip joint: Secondary | ICD-10-CM

## 2017-06-08 DIAGNOSIS — T84028D Dislocation of other internal joint prosthesis, subsequent encounter: Secondary | ICD-10-CM

## 2017-06-08 LAB — BASIC METABOLIC PANEL
Anion gap: 5 (ref 5–15)
BUN: 9 mg/dL (ref 6–20)
CO2: 25 mmol/L (ref 22–32)
Calcium: 8.1 mg/dL — ABNORMAL LOW (ref 8.9–10.3)
Chloride: 109 mmol/L (ref 101–111)
Creatinine, Ser: 0.59 mg/dL (ref 0.44–1.00)
GFR calc Af Amer: >60 mL/min (ref >60)
GFR calc non Af Amer: >60 mL/min (ref >60)
Glucose, Bld: 101 mg/dL — ABNORMAL HIGH (ref 65–99)
Potassium: 4.2 mmol/L (ref 3.5–5.1)
Sodium: 139 mmol/L (ref 135–145)

## 2017-06-08 LAB — CBC
HCT: 26.6 % — ABNORMAL LOW (ref 36.0–46.0)
Hemoglobin: 8.8 g/dL — ABNORMAL LOW (ref 12.0–15.0)
MCH: 29.9 pg (ref 26.0–34.0)
MCHC: 33.1 g/dL (ref 30.0–36.0)
MCV: 90.5 fL (ref 78.0–100.0)
Platelets: 240 10*3/uL (ref 150–400)
RBC: 2.94 MIL/uL — ABNORMAL LOW (ref 3.87–5.11)
RDW: 14.9 % (ref 11.5–15.5)
WBC: 7 10*3/uL (ref 4.0–10.5)

## 2017-06-08 MED ORDER — SENNOSIDES-DOCUSATE SODIUM 8.6-50 MG PO TABS
1.0000 | ORAL_TABLET | Freq: Two times a day (BID) | ORAL | Status: DC
  Administered 2017-06-08 – 2017-06-09 (×2): 1 via ORAL
  Filled 2017-06-08 (×2): qty 1

## 2017-06-08 MED ORDER — FERROUS SULFATE 325 (65 FE) MG PO TABS: 325.0000 mg | ORAL_TABLET | Freq: Two times a day (BID) | ORAL | 0 refills | Status: DC

## 2017-06-08 MED ORDER — HYDROMORPHONE HCL 2 MG PO TABS
2.0000 mg | ORAL_TABLET | ORAL | Status: DC | PRN
  Administered 2017-06-09 (×2): 2 mg via ORAL
  Filled 2017-06-08 (×2): qty 1

## 2017-06-08 MED ORDER — APIXABAN 2.5 MG PO TABS: 2.5000 mg | ORAL_TABLET | Freq: Two times a day (BID) | ORAL | 0 refills | Status: DC

## 2017-06-08 MED ORDER — HYDROMORPHONE HCL 2 MG PO TABS: 2.0000 mg | ORAL_TABLET | ORAL | 0 refills | Status: DC | PRN

## 2017-06-08 NOTE — Progress Notes (Signed)
Subjective: 2 Days Post-Op Procedure(s) (LRB): TOTAL HIP REVISION (Right) Patient reports pain as mild.   Patient seen in rounds with Dr. Wynelle Link. Patient is improving slowly. She has been up with therapy some. She is having some issues with consistently emptying her bladder. Positive flatus. She denies SOB and chest pain. She denies dizziness. She continues to have right ankle pain.    Objective: Vital signs in last 24 hours: Temp:  [98.1 F (36.7 C)-99.3 F (37.4 C)] 98.5 F (36.9 C) (03/18 1356) Pulse Rate:  [72-81] 74 (03/18 1356) Resp:  [13-17] 15 (03/18 1356) BP: (80-102)/(40-54) 102/43 (03/18 1356) SpO2:  [94 %-100 %] 100 % (03/18 1356)  Intake/Output from previous day:  Intake/Output Summary (Last 24 hours) at 06/08/2017 1435 Last data filed at 06/08/2017 1357 Gross per 24 hour  Intake 1662.5 ml  Output 1000 ml  Net 662.5 ml    Intake/Output this shift: Total I/O In: 180 [P.O.:180] Out: -   Labs: Recent Labs    06/05/17 1645 06/06/17 0551 06/07/17 0535 06/08/17 0704  HGB 10.7* 10.2* 9.5* 8.8*   Recent Labs    06/07/17 0535 06/08/17 0704  WBC 7.9 7.0  RBC 3.19* 2.94*  HCT 29.2* 26.6*  PLT 275 240   Recent Labs    06/07/17 0535 06/08/17 0704  NA 138 139  K 4.2 4.2  CL 104 109  CO2 28 25  BUN 11 9  CREATININE 0.67 0.59  GLUCOSE 93 101*  CALCIUM 8.3* 8.1*   Recent Labs    06/06/17 0551  INR 1.00    EXAM General - Patient is Alert and Oriented Extremity - Neurologically intact Intact pulses distally Dorsiflexion/Plantar flexion intact Compartment soft Dressing/Incision - clean, dry, no drainage Motor Function - intact, moving foot and toes well on exam.  She has some pain with passive and active motion of the right ankle but she is able to dorsiflex and plantar flex the ankle. Anterior tib and Achilles tendons intact. She is nontender about the medial and lateral malleolus but she has some swelling and tenderness along the course of  the posterior tib tendon.   Past Medical History:  Diagnosis Date  . Arthritis    oa  . Cervical cancer ([place]) 2005  . COPD (chronic obstructive pulmonary disease) ([place])   . Multiple sclerosis ([place])   . Osteoporosis   . Other diseases of vocal cords    vocal cords are not even  . Other voice and resonance disorders   . Pneumonia 2014, 1992  . Sleep apnea    no cpap used  . Unspecified hypothyroidism     Assessment/Plan: 2 Days Post-Op Procedure(s) (LRB): TOTAL HIP REVISION (Right) Active Problems:   Hypothyroidism   Multiple sclerosis ([facility])   Failed total hip arthroplasty with dislocation ([facility])   Hypotension  Estimated body mass index is 22.85 kg/m as calculated from the following:   Height as of this encounter: 5\' 3"  (1.6 m).   Weight as of this encounter: 58.5 kg (129 lb). Advance diet Up with therapy Discharge home with home health possibly Tuesday  DVT Prophylaxis - Eliquis Weight Bearing As Tolerated   Will continue therapy. Continue to monitor BP and ability to void. Hopeful for DC home tomorrow with HHPT. Discussed the importance of posterior hip precautions. Plain films of her right ankle were normal. She does appear to have a strain over her ankle, most specifically over the posterior tibial tendon, possibly secondary to manipulation during attempted closed reduction of  her right hip. We are placing her a brace to support the ankle.   [name], PA-C Orthopaedic Surgery 06/08/2017, 2:35 PM

## 2017-06-08 NOTE — Progress Notes (Signed)
Physical Therapy Treatment Patient Details Name: Kayla [name] MRN: [id number] DOB: 1950/07/17 Today's Date: 06/08/2017    History of Present Illness 67 y.o. female with h/o MS, R humeral head fx August 2018, R hip hemiarthroplasty June 2017 admitted for R hip dislocation, s/p revision with posterior precautions.     PT Comments    Assisted OOB to amb a limited distance with increased time.  Pt c/o R inner ankle pain but able to fully WB.  Reviewed X ray prior to session, no Fx.  Returned to room and performed some TE's followed by ICE.    Follow Up Recommendations  Follow surgeon's recommendation for DC plan and follow-up therapies;Home health PT     Equipment Recommendations  None recommended by PT    Recommendations for Other Services       Precautions / Restrictions Precautions Precautions: Posterior Hip Precaution Comments: reviewed precautions Restrictions Weight Bearing Restrictions: No RLE Weight Bearing: Weight bearing as tolerated    Mobility  Bed Mobility Overal bed mobility: Needs Assistance Bed Mobility: Supine to Sit;Sit to Supine     Supine to sit: Mod assist Sit to supine: Mod assist   General bed mobility comments: increased time  Transfers Overall transfer level: Needs assistance Equipment used: Rolling walker (2 wheeled) Transfers: Sit to/from [facility] Sit to Stand: Min assist Stand pivot transfers: Min assist       General transfer comment: min A to rise and steady, increased time 2* pain  50% VC's safety with turns  Ambulation/Gait Ambulation/Gait assistance: Min assist Ambulation Distance (Feet): 28 Feet   Gait Pattern/deviations: Step-to pattern Gait velocity: decreased   General Gait Details: increased time and excessive WBing thru walker   Stairs            Wheelchair Mobility    Modified Rankin (Stroke Patients Only)       Balance                                             Cognition Arousal/Alertness: Awake/alert Behavior During Therapy: WFL for tasks assessed/performed Overall Cognitive Status: Within Functional Limits for tasks assessed                                        Exercises   Total Hip Replacement TE's 10 reps ankle pumps 10 reps knee presses 10 reps heel slides 10 reps SAQ's 10 reps ABD Followed by ICE    General Comments        Pertinent Vitals/Pain Pain Assessment: 0-10 Pain Score: 5  Pain Location: R hip and R ankle ("from where they were pulling trying to pop my hip back in") Pain Descriptors / Indicators: Sore;Operative site guarding Pain Intervention(s): Monitored during session;Repositioned    Home Living                      Prior Function            PT Goals (current goals can now be found in the care plan section) Progress towards PT goals: Progressing toward goals    Frequency    7X/week      PT Plan Current plan remains appropriate    Co-evaluation  AM-PAC PT "6 Clicks" Daily Activity  Outcome Measure  Difficulty turning over in bed (including adjusting bedclothes, sheets and blankets)?: A Lot Difficulty moving from lying on back to sitting on the side of the bed? : A Lot Difficulty sitting down on and standing up from a chair with arms (e.g., wheelchair, bedside commode, etc,.)?: A Lot Help needed moving to and from a bed to chair (including a wheelchair)?: A Lot Help needed walking in hospital room?: A Lot Help needed climbing 3-5 steps with a railing? : A Lot 6 Click Score: 12    End of Session Equipment Utilized During Treatment: Gait belt Activity Tolerance: Patient limited by fatigue Patient left: in chair;with call bell/phone within reach Nurse Communication: Mobility status PT Visit Diagnosis: Muscle weakness (generalized) (M62.81);Difficulty in walking, not elsewhere classified (R26.2);Pain Pain - Right/Left: Right Pain - part of body: Hip      Time: 1010-1035 PT Time Calculation (min) (ACUTE ONLY): 25 min  Charges:  $Gait Training: 8-22 mins $Therapeutic Exercise: 8-22 mins                    G Codes:       Rica Koyanagi  PTA [facility]  Acute  Rehab Pager      [phone]

## 2017-06-08 NOTE — Discharge Instructions (Addendum)
INSTRUCTIONS AFTER JOINT REPLACEMENT   o Remove items at home which could result in a fall. This includes throw rugs or furniture in walking pathways o ICE to the affected joint every three hours while awake for 30 minutes at a time, for at least the first 3-5 days, and then as needed for pain and swelling.  Continue to use ice for pain and swelling. You may notice swelling that will progress down to the foot and ankle.  This is normal after surgery.  Elevate your leg when you are not up walking on it.   o Continue to use the breathing machine you got in the hospital (incentive spirometer) which will help keep your temperature down.  It is common for your temperature to cycle up and down following surgery, especially at night when you are not up moving around and exerting yourself.  The breathing machine keeps your lungs expanded and your temperature down.   DIET:  As you were doing prior to hospitalization, we recommend a well-balanced diet.  Information on my medicine - ELIQUIS (apixaban)  This medication education was reviewed with me or my healthcare representative as part of my discharge preparation.  The pharmacist that spoke with me during my hospital stay was:  Emiliano Dyer, [facility]  Why was Eliquis prescribed for you? Eliquis was prescribed for you to reduce the risk of blood clots forming after orthopedic surgery.    What do You need to know about Eliquis? Take your Eliquis TWICE DAILY - one tablet in the morning and one tablet in the evening with or without food.  It would be best to take the dose about the same time each day.  If you have difficulty swallowing the tablet whole please discuss with your pharmacist how to take the medication safely.  Take Eliquis exactly as prescribed by your doctor and DO NOT stop taking Eliquis without talking to the doctor who prescribed the medication.  Stopping without other medication to take the place of Eliquis may increase your risk  of developing a clot.  After discharge, you should have regular check-up appointments with your healthcare provider that is prescribing your Eliquis.  What do you do if you miss a dose? If a dose of ELIQUIS is not taken at the scheduled time, take it as soon as possible on the same day and twice-daily administration should be resumed.  The dose should not be doubled to make up for a missed dose.  Do not take more than one tablet of ELIQUIS at the same time.  Important Safety Information A possible side effect of Eliquis is bleeding. You should call your healthcare provider right away if you experience any of the following: ? Bleeding from an injury or your nose that does not stop. ? Unusual colored urine (red or dark brown) or unusual colored stools (red or black). ? Unusual bruising for unknown reasons. ? A serious fall or if you hit your head (even if there is no bleeding).  Some medicines may interact with Eliquis and might increase your risk of bleeding or clotting while on Eliquis. To help avoid this, consult your healthcare provider or pharmacist prior to using any new prescription or non-prescription medications, including herbals, vitamins, non-steroidal anti-inflammatory drugs (NSAIDs) and supplements.  This website has more information on Eliquis (apixaban): http://www.eliquis.com/eliquis/home  DRESSING / WOUND CARE / SHOWERING  Keep the surgical dressing until follow up.  The dressing is water proof, so you can shower without any extra covering.  IF THE DRESSING FALLS OFF or the wound gets wet inside, change the dressing with sterile gauze.  Please use good hand washing techniques before changing the dressing.  Do not use any lotions or creams on the incision until instructed by your surgeon.    ACTIVITY  o Increase activity slowly as tolerated, but follow the weight bearing instructions below.   o No driving for 6 weeks or until further direction given by your physician.   You cannot drive while taking narcotics.  o No lifting or carrying greater than 10 lbs. until further directed by your surgeon. o Avoid periods of inactivity such as sitting longer than an hour when not asleep. This helps prevent blood clots.  o You may return to work once you are authorized by your doctor.     WEIGHT BEARING   Weight bearing as tolerated with assist device (walker, cane, etc) as directed, use it as long as suggested by your surgeon or therapist, typically at least 4-6 weeks.   EXERCISES  Results after joint replacement surgery are often greatly improved when you follow the exercise, range of motion and muscle strengthening exercises prescribed by your doctor. Safety measures are also important to protect the joint from further injury. Any time any of these exercises cause you to have increased pain or swelling, decrease what you are doing until you are comfortable again and then slowly increase them. If you have problems or questions, call your caregiver or physical therapist for advice.    CONSTIPATION  Constipation is defined medically as fewer than three stools per week and severe constipation as less than one stool per week.  Even if you have a regular bowel pattern at home, your normal regimen is likely to be disrupted due to multiple reasons following surgery.  Combination of anesthesia, postoperative narcotics, change in appetite and fluid intake all can affect your bowels.   YOU MUST use at least one of the following options; they are listed in order of increasing strength to get the job done.  They are all available over the counter, and you may need to use some, POSSIBLY even all of these options:    Drink plenty of fluids (prune juice may be helpful) and high fiber foods Colace 100 mg by mouth twice a day  Senokot for constipation as directed and as needed Dulcolax (bisacodyl), take with full glass of water  Miralax (polyethylene glycol) once or twice a day as  needed.  If you have tried all these things and are unable to have a bowel movement in the first 3-4 days after surgery call either your surgeon or your primary doctor.    If you experience loose stools or diarrhea, hold the medications until you stool forms back up.  If your symptoms do not get better within 1 week or if they get worse, check with your doctor.  If you experience "the worst abdominal pain ever" or develop nausea or vomiting, please contact the office immediately for further recommendations for treatment.   ITCHING:  If you experience itching with your medications, try taking only a single pain pill, or even half a pain pill at a time.  You can also use Benadryl over the counter for itching or also to help with sleep.   TED HOSE STOCKINGS:  Use stockings on both legs until for at least 2 weeks or as directed by physician office. They may be removed at night for sleeping.  MEDICATIONS:  See your medication summary on  the After Visit Summary that nursing will review with you.  You may have some home medications which will be placed on hold until you complete the course of blood thinner medication.  It is important for you to complete the blood thinner medication as prescribed.  PRECAUTIONS:  If you experience chest pain or shortness of breath - call 911 immediately for transfer to the hospital emergency department.   If you develop a fever greater that 101 F, purulent drainage from wound, increased redness or drainage from wound, foul odor from the wound/dressing, or calf pain - CONTACT YOUR SURGEON.                                                   FOLLOW-UP APPOINTMENTS:  If you do not already have a post-op appointment, please call the office for an appointment to be seen by your surgeon.  Guidelines for how soon to be seen are listed in your After Visit Summary, but are typically between 1-4 weeks after surgery.  OTHER INSTRUCTIONS:  Follow posterior hip precautions  MAKE  SURE YOU:   Understand these instructions.   Get help right away if you are not doing well or get worse.    Thank you for letting us be a part of your medical care team.  It is a privilege we respect greatly.  We hope these instructions will help you stay on track for a fast and full recovery!

## 2017-06-08 NOTE — Progress Notes (Signed)
Spoke with patient at bedside. Confirmed plan for HEP. Has RW and 3n1. [phone] 

## 2017-06-08 NOTE — Op Note (Signed)
NAMEGEORGENE, Kayla [name] NO.:  [id number]  MEDICAL RECORD NO.:  [id number]  LOCATION:                                 FACILITY:  PHYSICIAN:  Kipp Brood. Tamaria Dunleavy, M.D.DATE OF BIRTH:  04/19/1950  DATE OF PROCEDURE:  06/05/2017 DATE OF DISCHARGE:                              OPERATIVE REPORT   SURGEON:  Kipp Brood. Gladstone Lighter, M.D.  ASSISTANTS:  [name], [place] and Rod Can, MD  PREOPERATIVE DIAGNOSIS:  Closed posterior dislocation of a right total hip.  POSTOPERATIVE DIAGNOSIS:  Closed posterior dislocation of a right total hip.  OPERATION:  Attempted closed reduction of a posterior dislocation under general anesthesia of the right total hip.  DESCRIPTION OF PROCEDURE:  The appropriate time-out was first carried out.  I also marked the appropriate right hip in the holding area.  At this time, under general anesthesia, numerous attempts at a closed manipulation of a total hip was carried out under C-arm control, I was unable to do so.  Dr. Lyla Glassing was kind enough to come in to assist, he attempted as well and we both felt at this time that we need to abort the case and schedule her in the morning after she is medically cleared and n.p.o. for an open reduction of a closed dislocated right total hip. We felt at this time that there most likely could be a fracture of the polyethylene insert or dislocation.  The hip basically was reduced under C-arm and when we attempted to bring the leg back down, it dislocated again.  So, we did all possible maneuvers to help reduce this, but were unsuccessful.          ______________________________ Kipp Brood. Gladstone Lighter, M.D.     RAG/MEDQ  D:  06/05/2017  T:  06/06/2017  Job:  [id number]

## 2017-06-08 NOTE — Consult Note (Signed)
Medical Consultation   Kayla [name]  ZOX:[id number]  DOB: Aug 03, 1950  DOA: 06/05/2017  PCP: Tanda Rockers, MD   Outpatient Specialists: Dr Gladstone Lighter with orthopedics    Requesting physician: Dr Gladstone Lighter with orthopedics   Reason for consultation: Hypotension.  History of Present Illness: Kayla [name] is an 67 y.o. female with prior h/o sleep apnea, hypothyroidism, COPD, on RA, Multiple sclerosis, h/o total right hip arthroplasty, presented to ED for dislocation of the right hip , underwent a revision of total hip arthroplasty with head ball liner exchange, on 3.16 by Dr Lyla Glassing has been having episodes of hypotension since last night as reported by RN. As per the patient, she denies having dizziness, palpitations, headache, chest pain, sob, pre syncope, lightheadedness. Her bp last night has been in 80/40's , which have responded to the fluid bolus given this am. Her bp at this time is 95/47'. She worked with PT today and was able to sit up at the edge of the bed. She denies any nausea, vomiting, abdominal pain or diarrhea.no headache, dizziness , denies any gross motor or sensory deficits. TRH consulted for evaluation of hypotension.   06/08/17: Patient seen and examined at bedside.  She denies any pain or dizziness.  Hypotension improved. She reports no bowel movement since admission.  Foley catheter removed today   Review of Systems: As per HPI otherwise 10 point review of systems negative.     Past Medical History: Past Medical History:  Diagnosis Date  . Arthritis    oa  . Cervical cancer ([place]) 2005  . COPD (chronic obstructive pulmonary disease) ([place])   . Multiple sclerosis ([place])   . Osteoporosis   . Other diseases of vocal cords    vocal cords are not even  . Other voice and resonance disorders   . Pneumonia 2014, 1992  . Sleep apnea    no cpap used  . Unspecified hypothyroidism     Past Surgical History: Past Surgical History:  Procedure  Laterality Date  . ABDOMINAL HYSTERECTOMY  2005   for cervical cancer, complete  . CONVERSION TO TOTAL HIP Right 09/18/2015   Procedure: CONVERSION OF RIGHT HEMI ARTHROPLASTY TO TOTAL HIP ARTHROPLASTY ACETABULAR REVISON ;  Surgeon: Paralee Cancel, MD;  Location: [facility];  Service: Orthopedics;  Laterality: Right;  . EYE SURGERY Bilateral    lens for cataracts  . HIP ARTHROPLASTY Right 03/01/2013   Procedure: ARTHROPLASTY BIPOLAR HIP;  Surgeon: Mauri Pole, MD;  Location: [facility];  Service: Orthopedics;  Laterality: Right;  . HIP CLOSED REDUCTION Right 03/12/2013   Procedure: CLOSED REDUCTION HIP;  Surgeon: Gearlean Alf, MD;  Location: [place];  Service: Orthopedics;  Laterality: Right;  . HIP CLOSED REDUCTION Right 03/19/2013   Procedure: CLOSED REDUCTION HIP;  Surgeon: Johnn Hai, MD;  Location: [place];  Service: Orthopedics;  Laterality: Right;  . HIP CLOSED REDUCTION Right 11/10/2015   Procedure: CLOSED MANIPULATION HIP;  Surgeon: Rod Can, MD;  Location: [facility];  Service: Orthopedics;  Laterality: Right;  . HIP CLOSED REDUCTION Right 06/05/2017   Procedure: CLOSED REDUCTION HIP;  Surgeon: [name], MD;  Location: [facility];  Service: Orthopedics;  Laterality: Right;  . TOTAL HIP REVISION Right 06/06/2017   Procedure: TOTAL HIP REVISION;  Surgeon: [name], MD;  Location: [facility];  Service: Orthopedics;  Laterality: Right;  Marland Kitchen VESICOVAGINAL FISTULA CLOSURE W/ TAH  2005  Allergies:   Allergies  Allergen Reactions  . Tramadol     hallucinations   . Iohexol Hives  . Methylprednisolone Other (See Comments)    Reaction:  Decreases pts heart rate   . Morphine And Related Other (See Comments)    Reaction:  Hallucinations   . Oxycodone-Acetaminophen Rash and Other (See Comments)    Reaction:  Hallucinations   . Phenytoin Sodium Extended Rash  . Zanaflex [Tizanidine Hydrochloride] Rash     Social History:  reports that  has never smoked. she has never used  smokeless tobacco. She reports that she drinks alcohol. She reports that she does not use drugs.   Family History: Family History  Problem Relation Age of Onset  . Colon cancer Father     Reviewed.    Physical Exam: Vitals:   06/08/17 0130 06/08/17 0625 06/08/17 0916 06/08/17 1356  BP:  (!) 80/54  (!) 102/43  Pulse:  74  74  Resp:  13  15  Temp: 98.6 F (37 C) 98.1 F (36.7 C)  98.5 F (36.9 C)  TempSrc: Oral Axillary  Axillary  SpO2:  94% 95% 100%  Weight:      Height:       General: 67 year old Caucasian female well-developed well-nourished in no acute distress alert oriented x3 HEENT: Mucous membrane moist, no erythema no drainage Heart: Regular rate and rhythm with no murmurs or rubs Lungs: Clear to auscultation with no wheezes or rales Abdomen: Soft nontender nondistended with normal bowel sounds x4  Extremity: No drainage from incision site at the right hip Mild edema in right lower extremity Psych: Mood is appropriate for condition and setting Skin: No rash  Data reviewed:  I have personally reviewed following labs and imaging studies Labs:  CBC: Recent Labs  Lab 06/05/17 1645 06/06/17 0551 06/07/17 0535 06/08/17 0704  WBC 6.4 6.7 7.9 7.0  NEUTROABS 4.7  --   --   --   HGB 10.7* 10.2* 9.5* 8.8*  HCT 32.9* 31.9* 29.2* 26.6*  MCV 89.6 91.4 91.5 90.5  PLT 343 303 275 132    Basic Metabolic Panel: Recent Labs  Lab 06/05/17 1645 06/06/17 0551 06/07/17 0535 06/08/17 0704  NA 137 137 138 139  K 4.1 4.1 4.2 4.2  CL 102 102 104 109  CO2 25 24 28 25   GLUCOSE 107* 73 93 101*  BUN 18 15 11 9   CREATININE 0.97 0.76 0.67 0.59  CALCIUM 8.4* 8.2* 8.3* 8.1*   GFR Estimated Creatinine Clearance: 58.8 mL/min (by C-G formula based on SCr of 0.59 mg/dL). Liver Function Tests: Recent Labs  Lab 06/05/17 1645 06/06/17 0551  AST 23 25  ALT 11* 8*  ALKPHOS 50 44  BILITOT 0.7 0.9  PROT 6.0* 5.0*  ALBUMIN 3.7 3.1*   No results for input(s): LIPASE,  AMYLASE in the last 168 hours. No results for input(s): AMMONIA in the last 168 hours. Coagulation profile Recent Labs  Lab 06/06/17 0551  INR 1.00    Cardiac Enzymes: No results for input(s): CKTOTAL, CKMB, CKMBINDEX, TROPONINI in the last 168 hours. BNP: Invalid input(s): POCBNP CBG: Recent Labs  Lab 06/06/17 1446  GLUCAP 95   D-Dimer No results for input(s): DDIMER in the last 72 hours. Hgb A1c No results for input(s): HGBA1C in the last 72 hours. Lipid Profile No results for input(s): CHOL, HDL, LDLCALC, TRIG, CHOLHDL, LDLDIRECT in the last 72 hours. Thyroid function studies No results for input(s): TSH, T4TOTAL, T3FREE, THYROIDAB in the last 72  hours.  Invalid input(s): FREET3 Anemia work up No results for input(s): VITAMINB12, FOLATE, FERRITIN, TIBC, IRON, RETICCTPCT in the last 72 hours. Urinalysis    Component Value Date/Time   COLORURINE YELLOW 05/14/2017 1029   APPEARANCEUR Sl Cloudy (A) 05/14/2017 1029   LABSPEC 1.010 05/14/2017 1029   PHURINE 6.5 05/14/2017 1029   GLUCOSEU NEGATIVE 05/14/2017 1029   HGBUR SMALL (A) 05/14/2017 1029   BILIRUBINUR NEGATIVE 05/14/2017 1029   KETONESUR NEGATIVE 05/14/2017 1029   PROTEINUR NEGATIVE 11/07/2016 2359   UROBILINOGEN 0.2 05/14/2017 1029   NITRITE NEGATIVE 05/14/2017 1029   LEUKOCYTESUR LARGE (A) 05/14/2017 1029     Microbiology Recent Results (from the past 240 hour(s))  Surgical pcr screen     Status: None   Collection Time: 06/06/17  9:20 AM  Result Value Ref Range Status   MRSA, PCR NEGATIVE NEGATIVE Final   Staphylococcus aureus NEGATIVE NEGATIVE Final    Comment: (NOTE) The Xpert SA Assay (FDA approved for NASAL specimens in patients 53 years of age and older), is one component of a comprehensive surveillance program. It is not intended to diagnose infection nor to guide or monitor treatment. Performed at [facility], [place] [address] [place], [place] [address]         Inpatient Medications:   Scheduled Meds: . acetaminophen  650 mg Oral Q6H  . amphetamine-dextroamphetamine  20 mg Oral BID AC  . apixaban  2.5 mg Oral Q12H  . baclofen  20 mg Oral TID  . docusate sodium  100 mg Oral BID  . ferrous sulfate  325 mg Oral TID PC  . FLUoxetine  40 mg Oral Daily  . gabapentin  600 mg Oral BID  . levothyroxine  100 mcg Oral QAC breakfast  . mometasone-formoterol  2 puff Inhalation BID  . multivitamin with minerals  1 tablet Oral Daily  . pantoprazole  40 mg Oral Daily  . sodium chloride flush  3 mL Intravenous Q12H  . temazepam  15 mg Oral QHS   Continuous Infusions: . sodium chloride 1,000 mL (06/07/17 0413)  . methocarbamol (ROBAXIN)  IV Stopped (06/06/17 1523)     Radiological Exams on Admission: Dg Ankle Complete Right  Result Date: 06/08/2017 CLINICAL DATA:  67 year old female with new onset right ankle pain. Medial pain. Recent right hip dislocation and reduction following manipulation. EXAM: RIGHT ANKLE - COMPLETE 3+ VIEW COMPARISON:  None. FINDINGS: Generalized soft tissue swelling. No right ankle joint effusion is evident. Preserved mortise joint alignment. Talar dome intact. Bone mineralization is within normal limits. The distal tibia and fibula appear intact. No acute osseous abnormality identified. IMPRESSION: No acute osseous abnormality identified about the right ankle. Electronically Signed   By: Genevie Ann M.D.   On: 06/08/2017 09:30    Impression/Recommendations Active Problems:   Hypothyroidism   Multiple sclerosis (HCC)   Failed total hip arthroplasty with dislocation (HCC)   Hypotension  Hypotension with no tachycardia Blood pressure stable Maintain map 65 or greater  Anemia of chronic disease Baseline hemoglobin 10 Watch for blood loss Continue ferrous sulfate 325 mg 3 times daily Repeat CBC in the morning  Dislocation of right hip arthroplastic Orthopedic surgery following Eliquis for DVT prophylaxis Pain  management in place  Hypothyroidism Levothyroxine  GERD Protonix  COPD Stable Continue home meds Dulera  Anxiety/depression Continue Prozac, temazepam  ADHD Continue Adderall  Ambulatory dysfunction Post orthopedic surgery  PT evaluated Fall precaution    Thank you for this consultation.  Our [facility]  hospitalist team will follow the patient with you.   Time Spent: 45 minutes  Kayleen Memos M.D. [facility] 06/08/2017, 5:09 PM

## 2017-06-08 NOTE — Progress Notes (Signed)
    Subjective:  Patient reports pain as mild to moderate.  Denies N/V/CP/SOB. C/o right ankle pain. States hip feels fine.  Objective:   VITALS:   Vitals:   06/07/17 1938 06/07/17 2140 06/08/17 0130 06/08/17 0625  BP:  (!) 101/43  (!) 80/54  Pulse:  81  74  Resp:  17  13  Temp:  99.3 F (37.4 C) 98.6 F (37 C) 98.1 F (36.7 C)  TempSrc:  Axillary Oral Axillary  SpO2: 95% 100%  94%  Weight:      Height:        NAD ABD soft Sensation intact distally Intact pulses distally Dorsiflexion/Plantar flexion intact Incision: dressing C/D/I Compartment soft R ankle swollen and ecchymotic medially   Lab Results  Component Value Date   WBC 7.0 06/08/2017   HGB 8.8 (L) 06/08/2017   HCT 26.6 (L) 06/08/2017   MCV 90.5 06/08/2017   PLT 240 06/08/2017   BMET    Component Value Date/Time   NA 139 06/08/2017 0704   K 4.2 06/08/2017 0704   CL 109 06/08/2017 0704   CO2 25 06/08/2017 0704   GLUCOSE 101 (H) 06/08/2017 0704   BUN 9 06/08/2017 0704   CREATININE 0.59 06/08/2017 0704   CALCIUM 8.1 (L) 06/08/2017 0704   GFRNONAA >60 06/08/2017 0704   GFRAA >60 06/08/2017 0704     Assessment/Plan: 2 Days Post-Op   Active Problems:   Hypothyroidism   Multiple sclerosis (HCC)   Failed total hip arthroplasty with dislocation (HCC)   Hypotension   WBAT with walker Posterior hip precautions DVT ppx: apixaban, SCDs, TEDS PO pain control PT/OT ABLA on chronic anemia: defer to hospitalist Dispo: will obtain R ankle xrays, D/C planning   Kayla [name] 06/08/2017, 7:49 AM   Rod Can, MD Cell [phone]

## 2017-06-08 NOTE — Evaluation (Signed)
Occupational Therapy Evaluation Patient Details Name: Kayla [name] MRN: [id number] DOB: 1950/06/19 Today's Date: 06/08/2017    History of Present Illness 67 y.o. female with h/o MS, R humeral head fx August 2018, R hip hemiarthroplasty June 2017 admitted for R hip dislocation, s/p revision with posterior precautions.    Clinical Impression   Pt is s/p THA resulting in the deficits listed below (see OT Problem List).  Pt will benefit from skilled OT to increase their safety and independence with ADL and functional mobility for ADL to facilitate discharge to venue listed below.        Follow Up Recommendations  No OT follow up    Equipment Recommendations  None recommended by OT    Recommendations for Other Services       Precautions / Restrictions Precautions Precautions: Posterior Hip Precaution Comments: reviewed precautions Restrictions Weight Bearing Restrictions: No RLE Weight Bearing: Partial weight bearing      Mobility Bed Mobility Overal bed mobility: Needs Assistance Bed Mobility: Sit to Supine     Supine to sit: Mod assist Sit to supine: Mod assist   General bed mobility comments: increased time  Transfers Overall transfer level: Needs assistance Equipment used: Rolling walker (2 wheeled) Transfers: Sit to/from [facility] Sit to Stand: Min assist Stand pivot transfers: Min assist       General transfer comment: min A to rise and steady, increased time 2* pain  50% VC's safety with turns    Balance Overall balance assessment: Needs assistance Sitting-balance support: Feet supported;Single extremity supported Sitting balance-Leahy Scale: Good     Standing balance support: Bilateral upper extremity supported Standing balance-Leahy Scale: Poor                             ADL either performed or assessed with clinical judgement   ADL Overall ADL's : Needs assistance/impaired Eating/Feeding: Set up;Sitting    Grooming: Set up;Sitting           Upper Body Dressing : Set up;Sitting   Lower Body Dressing: Maximal assistance;Sit to/from stand;Cueing for safety Lower Body Dressing Details (indicate cue type and reason): needs Ae instruction Toilet Transfer: Minimal assistance;Ambulation;RW;Comfort height toilet   Toileting- Clothing Manipulation and Hygiene: Moderate assistance;Sit to/from stand;Cueing for safety;Cueing for sequencing         General ADL Comments: pt needs Ae instruction     Vision Patient Visual Report: No change from baseline              Pertinent Vitals/Pain Pain Assessment: 0-10 Pain Score: 4  Pain Location: r ankle Pain Descriptors / Indicators: Sore Pain Intervention(s): Limited activity within patient's tolerance;Repositioned     Hand Dominance     Extremity/Trunk Assessment Upper Extremity Assessment Upper Extremity Assessment: Generalized weakness           Communication Communication Communication: No difficulties   Cognition Arousal/Alertness: Awake/alert Behavior During Therapy: WFL for tasks assessed/performed Overall Cognitive Status: Within Functional Limits for tasks assessed                                                Home Living Family/patient expects to be discharged to:: Private residence Living Arrangements: Spouse/significant other Available Help at Discharge: Family(husband works, has 24* assist available) Type of Home: House Home Access: Stairs to enter  Entrance Stairs-Number of Steps: 4 Entrance Stairs-Rails: Right;Left Home Layout: One level     Bathroom Shower/Tub: Walk-in shower         Home Equipment: Cane - single point;Walker - 4 wheels;Transport chair;Walker - 2 wheels;Bedside commode;Shower seat          Prior Functioning/Environment Level of Independence: Independent with assistive device(s)        Comments: walked with cane, independent ADLs        OT Problem List:  Decreased strength;Decreased activity tolerance;Decreased safety awareness;Pain      OT Treatment/Interventions: Self-care/ADL training;DME and/or AE instruction;Patient/family education    OT Goals(Current goals can be found in the care plan section) Acute Rehab OT Goals Patient Stated Goal: to walk OT Goal Formulation: With patient Time For Goal Achievement: 06/15/17 Potential to Achieve Goals: Good  OT Frequency: Min 2X/week   Barriers to D/C:               AM-PAC PT "6 Clicks" Daily Activity     Outcome Measure Help from another person eating meals?: None Help from another person taking care of personal grooming?: A Little Help from another person toileting, which includes using toliet, bedpan, or urinal?: A Little Help from another person bathing (including washing, rinsing, drying)?: A Lot Help from another person to put on and taking off regular upper body clothing?: A Little Help from another person to put on and taking off regular lower body clothing?: A Lot 6 Click Score: 17   End of Session Equipment Utilized During Treatment: Rolling walker Nurse Communication: Mobility status  Activity Tolerance: Patient tolerated treatment well Patient left: in bed;with call bell/phone within reach  OT Visit Diagnosis: Unsteadiness on feet (R26.81);Muscle weakness (generalized) (M62.81);Pain                Time: 8502-7741 OT Time Calculation (min): 13 min Charges:  OT General Charges $OT Visit: 1 Visit OT Evaluation $OT Eval Moderate Complexity: 1 Mod G-Codes:     Kari Baars, [place]  Payton Mccallum D 06/08/2017, 5:03 PM

## 2017-06-08 NOTE — Progress Notes (Signed)
Physical Therapy Treatment Patient Details Name: Kayla [name] MRN: [id number] DOB: January 28, 1951 Today's Date: 06/08/2017    History of Present Illness 66 y.o. female with h/o MS, R humeral head fx August 2018, R hip hemiarthroplasty June 2017 admitted for R hip dislocation, s/p revision with posterior precautions.     PT Comments    POD # 3 pm session Assisted with amb a greater distance in hallway then assisted back to bed.  Caution to posterior THP multiple dislocations.   Follow Up Recommendations  Follow surgeon's recommendation for DC plan and follow-up therapies;Home health PT     Equipment Recommendations  None recommended by PT    Recommendations for Other Services       Precautions / Restrictions Precautions Precautions: Posterior Hip Precaution Comments: reviewed precautions Restrictions Weight Bearing Restrictions: No RLE Weight Bearing: Weight bearing as tolerated    Mobility  Bed Mobility Overal bed mobility: Needs Assistance Bed Mobility: Sit to Supine      Sit to supine: Mod assist   General bed mobility comments: increased time assisted back to bed   Transfers Overall transfer level: Needs assistance Equipment used: Rolling walker (2 wheeled) Transfers: Sit to/from [facility] Sit to Stand: Min assist Stand pivot transfers: Min assist       General transfer comment: min A to rise and steady, increased time 2* pain  50% VC's safety with turns  Ambulation/Gait Ambulation/Gait assistance: Min assist Ambulation Distance (Feet): 45 Feet   Gait Pattern/deviations: Step-to pattern Gait velocity: decreased   General Gait Details: increased time and excessive WBing thru walker  tolerated an increased distance.   Stairs            Wheelchair Mobility    Modified Rankin (Stroke Patients Only)       Balance                                            Cognition Arousal/Alertness:  Awake/alert Behavior During Therapy: WFL for tasks assessed/performed Overall Cognitive Status: Within Functional Limits for tasks assessed                                        Exercises      General Comments        Pertinent Vitals/Pain Pain Assessment: 0-10 Pain Score: 5  Pain Location: R hip and R ankle ("from where they were pulling trying to pop my hip back in") Pain Descriptors / Indicators: Sore;Operative site guarding Pain Intervention(s): Monitored during session;Repositioned    Home Living                      Prior Function            PT Goals (current goals can now be found in the care plan section) Progress towards PT goals: Progressing toward goals    Frequency    7X/week      PT Plan Current plan remains appropriate    Co-evaluation              AM-PAC PT "6 Clicks" Daily Activity  Outcome Measure  Difficulty turning over in bed (including adjusting bedclothes, sheets and blankets)?: A Lot Difficulty moving from lying on back to sitting on the side of the bed? :  A Lot Difficulty sitting down on and standing up from a chair with arms (e.g., wheelchair, bedside commode, etc,.)?: A Lot Help needed moving to and from a bed to chair (including a wheelchair)?: A Lot Help needed walking in hospital room?: A Lot Help needed climbing 3-5 steps with a railing? : A Lot 6 Click Score: 12    End of Session Equipment Utilized During Treatment: Gait belt Activity Tolerance: Patient limited by fatigue Patient left: in chair;with call bell/phone within reach Nurse Communication: Mobility status PT Visit Diagnosis: Muscle weakness (generalized) (M62.81);Difficulty in walking, not elsewhere classified (R26.2);Pain Pain - Right/Left: Right Pain - part of body: Hip     Time: 1550-1605 PT Time Calculation (min) (ACUTE ONLY): 15 min  Charges:  $Gait Training: 8-22 mins                    G Codes:       Rica Koyanagi   PTA [facility]  Acute  Rehab Pager      [phone]

## 2017-06-09 LAB — CBC
HCT: 26.9 % — ABNORMAL LOW (ref 36.0–46.0)
Hemoglobin: 8.8 g/dL — ABNORMAL LOW (ref 12.0–15.0)
MCH: 29.7 pg (ref 26.0–34.0)
MCHC: 32.7 g/dL (ref 30.0–36.0)
MCV: 90.9 fL (ref 78.0–100.0)
Platelets: 271 10*3/uL (ref 150–400)
RBC: 2.96 MIL/uL — ABNORMAL LOW (ref 3.87–5.11)
RDW: 15.4 % (ref 11.5–15.5)
WBC: 7.6 10*3/uL (ref 4.0–10.5)

## 2017-06-09 MED ORDER — DICLOFENAC SODIUM 1 % TD GEL
2.0000 g | Freq: Three times a day (TID) | TRANSDERMAL | Status: DC
  Administered 2017-06-09: 09:00:00 2 g via TOPICAL
  Filled 2017-06-09: qty 100

## 2017-06-09 NOTE — Progress Notes (Signed)
Received orders for HHPT, evaluate and treat, patient is agreeable. She has used [facility] in the past and would like to use them again. Contacted [facility] rep for referral. No further d/c needs, anticipate home today. [phone]

## 2017-06-09 NOTE — Progress Notes (Signed)
Occupational Therapy Treatment Patient Details Name: Kayla [name] MRN: [id number] DOB: 10/10/1950 Today's Date: 06/09/2017    History of present illness 67 y.o. female with h/o MS, R humeral head fx August 2018, R hip hemiarthroplasty June 2017 admitted for R hip dislocation, s/p revision with posterior precautions.    OT comments  Pt plan to go home this day with family to A as needed           Precautions / Restrictions Precautions Precautions: Posterior Hip Precaution Booklet Issued: Yes (comment) Precaution Comments: reviewed precautions Required Braces or Orthoses: Other Brace/Splint Other Brace/Splint: R ankle brace Restrictions Weight Bearing Restrictions: No RLE Weight Bearing: Weight bearing as tolerated       Mobility Bed Mobility Overal bed mobility: Needs Assistance Bed Mobility: Sit to Supine       Sit to supine: Min assist   General bed mobility comments: assist for LEs into bed  Transfers Overall transfer level: Needs assistance Equipment used: Rolling walker (2 wheeled) Transfers: Sit to/from Stand Sit to Stand: Min assist         General transfer comment: min A to rise and steady, increased time,  VC's safey and precautions with turns    Balance Overall balance assessment: Needs assistance Sitting-balance support: Feet supported;Single extremity supported Sitting balance-Leahy Scale: Good     Standing balance support: Bilateral upper extremity supported Standing balance-Leahy Scale: Poor                             ADL either performed or assessed with clinical judgement   ADL Overall ADL's : Needs assistance/impaired                         Toilet Transfer: Min guard;RW;Comfort height toilet;Ambulation   Toileting- Clothing Manipulation and Hygiene: Sit to/from stand;Cueing for sequencing;Cueing for safety;Minimal assistance       Functional mobility during ADLs: Min guard;Rolling walker General ADL  Comments: pt able to verbalize use of AE. Pt has all AE per sister in law but sister in law plans to A her as needed.  Pt able to verbalize 3/3 hip precautions.  Pt will have 24/7 A.               Cognition Arousal/Alertness: Awake/alert Behavior During Therapy: WFL for tasks assessed/performed Overall Cognitive Status: Within Functional Limits for tasks assessed                                                     Pertinent Vitals/ Pain       Pain Score: 3  Pain Location: R ankle Pain Descriptors / Indicators: Aching Pain Intervention(s): Limited activity within patient's tolerance;Monitored during session;Ice applied;Premedicated before session      Progress Toward Goals  OT Goals(current goals can now be found in the care plan section)     Acute Rehab OT Goals Patient Stated Goal: to walk      End of Session     sitting EOb with sister in law                 Time: [phone] OT Time Calculation (min): 22 min  Charges: OT General Charges $OT Visit: 1 Visit OT Treatments $Self Care/Home Management : 8-22 mins  Cecille Rubin  [address], [place]   Payton Mccallum D 06/09/2017, 12:24 PM

## 2017-06-09 NOTE — Progress Notes (Signed)
Patient discharged to home w/ friend. Given all belongings, instructions, prescriptions. Verbalized understanding of all instructions. Escorted to pov via w/c.

## 2017-06-09 NOTE — Progress Notes (Signed)
Subjective: 3 Days Post-Op Procedure(s) (LRB): TOTAL HIP REVISION (Right) Patient reports pain as 2 on 0-10 scale. Doing fine this morning. Her HBg is stable.Will DC.Her BP is also stable.  Objective: Vital signs in last 24 hours: Temp:  [98.5 F (36.9 C)-99 F (37.2 C)] 99 F (37.2 C) (03/19 0559) Pulse Rate:  [74-82] 80 (03/19 0559) Resp:  [15-18] 18 (03/19 0559) BP: (102-114)/(43-58) 114/58 (03/19 0559) SpO2:  [91 %-100 %] 97 % (03/19 0559)  Intake/Output from previous day: 03/18 0701 - 03/19 0700 In: 807.5 [P.O.:480; I.V.:327.5] Out: 650 [Urine:650] Intake/Output this shift: Total I/O In: 180 [P.O.:180] Out: -   Recent Labs    06/07/17 0535 06/08/17 0704 06/09/17 0549  HGB 9.5* 8.8* 8.8*   Recent Labs    06/08/17 0704 06/09/17 0549  WBC 7.0 7.6  RBC 2.94* 2.96*  HCT 26.6* 26.9*  PLT 240 271   Recent Labs    06/07/17 0535 06/08/17 0704  NA 138 139  K 4.2 4.2  CL 104 109  CO2 28 25  BUN 11 9  CREATININE 0.67 0.59  GLUCOSE 93 101*  CALCIUM 8.3* 8.1*   No results for input(s): LABPT, INR in the last 72 hours.  Dorsiflexion/Plantar flexion intact  Assessment/Plan: 3 Days Post-Op Procedure(s) (LRB): TOTAL HIP REVISION (Right) Up with therapy discontinued home today.  [name] 06/09/2017, 7:00 AM

## 2017-06-09 NOTE — Progress Notes (Signed)
Physical Therapy Treatment Patient Details Name: Kayla [name] MRN: [id number] DOB: 10-31-1950 Today's Date: 06/09/2017    History of Present Illness 67 y.o. female with h/o MS, R humeral head fx August 2018, R hip hemiarthroplasty June 2017 admitted for R hip dislocation, s/p revision with posterior precautions.     PT Comments    Pt progressing well with mobility, she ambulated 100' with RW. Reviewed posterior hip precautions (pt recalled 2 of 3), performed stair training, and reviewed HEP. From PT standpoint she is ready to DC home.   Follow Up Recommendations  Follow surgeon's recommendation for DC plan and follow-up therapies;Home health PT     Equipment Recommendations  None recommended by PT    Recommendations for Other Services       Precautions / Restrictions Precautions Precautions: Posterior Hip Precaution Booklet Issued: Yes (comment) Precaution Comments: reviewed precautions Required Braces or Orthoses: Other Brace/Splint Other Brace/Splint: R ankle brace Restrictions Weight Bearing Restrictions: No RLE Weight Bearing: Weight bearing as tolerated    Mobility  Bed Mobility Overal bed mobility: Needs Assistance Bed Mobility: Sit to Supine       Sit to supine: Min assist   General bed mobility comments: assist for LEs into bed  Transfers Overall transfer level: Needs assistance Equipment used: Rolling walker (2 wheeled) Transfers: Sit to/from Stand Sit to Stand: Min assist         General transfer comment: min A to rise and steady, increased time,  VC's safey and precautions with turns  Ambulation/Gait Ambulation/Gait assistance: Min guard Ambulation Distance (Feet): 100 Feet Assistive device: Rolling walker (2 wheeled) Gait Pattern/deviations: Step-to pattern Gait velocity: decreased Gait velocity interpretation: Below normal speed for age/gender General Gait Details:  tolerated an increased distance, steady with RW, VCs for precautions with  turns   Stairs Stairs: Yes   Stair Management: One rail Right;Forwards;Step to pattern Number of Stairs: 3 General stair comments: assist to raise LLE, good sequencing  Wheelchair Mobility    Modified Rankin (Stroke Patients Only)       Balance Overall balance assessment: Needs assistance Sitting-balance support: Feet supported;Single extremity supported Sitting balance-Leahy Scale: Good     Standing balance support: Bilateral upper extremity supported Standing balance-Leahy Scale: Poor                              Cognition Arousal/Alertness: Awake/alert Behavior During Therapy: WFL for tasks assessed/performed Overall Cognitive Status: Within Functional Limits for tasks assessed                                        Exercises Total Joint Exercises Hip ABduction/ADduction: AAROM;Right;10 reps;Standing Long Arc Quad: AROM;Right;10 reps;Seated Knee Flexion: AROM;10 reps;Right;Standing Marching in Standing: AROM;Right;10 reps;Standing Standing Hip Extension: AROM;Right;10 reps;Standing    General Comments        Pertinent Vitals/Pain Pain Score: 3  Pain Location: R ankle Pain Descriptors / Indicators: Aching Pain Intervention(s): Limited activity within patient's tolerance;Monitored during session;Ice applied;Premedicated before session    Home Living                      Prior Function            PT Goals (current goals can now be found in the care plan section) Acute Rehab PT Goals Patient Stated Goal: to walk PT Goal  Formulation: With patient/family Time For Goal Achievement: 06/21/17 Potential to Achieve Goals: Good Progress towards PT goals: Progressing toward goals    Frequency    7X/week      PT Plan Current plan remains appropriate    Co-evaluation              AM-PAC PT "6 Clicks" Daily Activity  Outcome Measure  Difficulty turning over in bed (including adjusting bedclothes, sheets and  blankets)?: A Little Difficulty moving from lying on back to sitting on the side of the bed? : Unable Difficulty sitting down on and standing up from a chair with arms (e.g., wheelchair, bedside commode, etc,.)?: Unable Help needed moving to and from a bed to chair (including a wheelchair)?: A Little Help needed walking in hospital room?: A Little Help needed climbing 3-5 steps with a railing? : A Lot 6 Click Score: 13    End of Session Equipment Utilized During Treatment: Gait belt Activity Tolerance: Patient limited by fatigue;Patient tolerated treatment well Patient left: with call bell/phone within reach;in bed Nurse Communication: Mobility status PT Visit Diagnosis: Muscle weakness (generalized) (M62.81);Difficulty in walking, not elsewhere classified (R26.2);Pain Pain - Right/Left: Right Pain - part of body: Hip     Time: 8088-1103 PT Time Calculation (min) (ACUTE ONLY): 29 min  Charges:  $Gait Training: 8-22 mins $Therapeutic Exercise: 8-22 mins                    G Codes:          Philomena Doheny 06/09/2017, 11:04 AM [phone]

## 2017-06-09 NOTE — Consult Note (Signed)
Medical Consultation   Kayla [name]  AGT:[id number]  DOB: January 20, 1951  DOA: 06/05/2017  PCP: Tanda Rockers, MD   Outpatient Specialists: Dr Gladstone Lighter with orthopedics    Requesting physician: Dr Gladstone Lighter with orthopedics   Reason for consultation: Hypotension.  History of Present Illness: Kayla [name] is an 67 y.o. female with prior h/o sleep apnea, hypothyroidism, COPD, on RA, Multiple sclerosis, h/o total right hip arthroplasty, presented to ED for dislocation of the right hip , underwent a revision of total hip arthroplasty with head ball liner exchange, on 3.16 by Dr Lyla Glassing has been having episodes of hypotension since last night as reported by RN. As per the patient, she denies having dizziness, palpitations, headache, chest pain, sob, pre syncope, lightheadedness. Her bp last night has been in 80/40's , which have responded to the fluid bolus given this am. Her bp at this time is 95/47'. She worked with PT today and was able to sit up at the edge of the bed. She denies any nausea, vomiting, abdominal pain or diarrhea.no headache, dizziness , denies any gross motor or sensory deficits. TRH consulted for evaluation of hypotension.   06/08/17: Patient seen and examined at bedside.  She denies any pain or dizziness.  Hypotension improved. She reports no bowel movement since admission.  Foley catheter removed today.  06/09/2017: Patient seen and examined at bedside.  She admits to right lower extremity swelling.  Right knee and right ankle soreness.  Voltaren gel ordered to apply to areas.  Denies any dyspnea or chest pain.  Plan is to be discharged today per primary team.   Review of Systems: As per HPI otherwise 10 point review of systems negative.     Past Medical History: Past Medical History:  Diagnosis Date  . Arthritis    oa  . Cervical cancer ([place]) 2005  . COPD (chronic obstructive pulmonary disease) ([place])   . Multiple sclerosis ([place])   . Osteoporosis     . Other diseases of vocal cords    vocal cords are not even  . Other voice and resonance disorders   . Pneumonia 2014, 1992  . Sleep apnea    no cpap used  . Unspecified hypothyroidism     Past Surgical History: Past Surgical History:  Procedure Laterality Date  . ABDOMINAL HYSTERECTOMY  2005   for cervical cancer, complete  . CONVERSION TO TOTAL HIP Right 09/18/2015   Procedure: CONVERSION OF RIGHT HEMI ARTHROPLASTY TO TOTAL HIP ARTHROPLASTY ACETABULAR REVISON ;  Surgeon: Paralee Cancel, MD;  Location: [facility];  Service: Orthopedics;  Laterality: Right;  . EYE SURGERY Bilateral    lens for cataracts  . HIP ARTHROPLASTY Right 03/01/2013   Procedure: ARTHROPLASTY BIPOLAR HIP;  Surgeon: Mauri Pole, MD;  Location: [facility];  Service: Orthopedics;  Laterality: Right;  . HIP CLOSED REDUCTION Right 03/12/2013   Procedure: CLOSED REDUCTION HIP;  Surgeon: Gearlean Alf, MD;  Location: [facility];  Service: Orthopedics;  Laterality: Right;  . HIP CLOSED REDUCTION Right 03/19/2013   Procedure: CLOSED REDUCTION HIP;  Surgeon: Johnn Hai, MD;  Location: [facility];  Service: Orthopedics;  Laterality: Right;  . HIP CLOSED REDUCTION Right 11/10/2015   Procedure: CLOSED MANIPULATION HIP;  Surgeon: Rod Can, MD;  Location: [facility];  Service: Orthopedics;  Laterality: Right;  . HIP CLOSED REDUCTION Right 06/05/2017   Procedure: CLOSED REDUCTION HIP;  Surgeon: [name],  MD;  Location: [facility];  Service: Orthopedics;  Laterality: Right;  . TOTAL HIP REVISION Right 06/06/2017   Procedure: TOTAL HIP REVISION;  Surgeon: [name], MD;  Location: [facility];  Service: Orthopedics;  Laterality: Right;  Marland Kitchen VESICOVAGINAL FISTULA CLOSURE W/ TAH  2005     Allergies:   Allergies  Allergen Reactions  . Tramadol     hallucinations   . Iohexol Hives  . Methylprednisolone Other (See Comments)    Reaction:  Decreases pts heart rate   . Morphine And Related Other (See Comments)    Reaction:   Hallucinations   . Oxycodone-Acetaminophen Rash and Other (See Comments)    Reaction:  Hallucinations   . Phenytoin Sodium Extended Rash  . Zanaflex [Tizanidine Hydrochloride] Rash     Social History:  reports that  has never smoked. she has never used smokeless tobacco. She reports that she drinks alcohol. She reports that she does not use drugs.   Family History: Family History  Problem Relation Age of Onset  . Colon cancer Father     Reviewed.    Physical Exam: Vitals:   06/08/17 2023 06/08/17 2105 06/09/17 0559 06/09/17 0738  BP:  (!) 114/43 (!) 114/58   Pulse:  82 80   Resp:  16 18   Temp:  99 F (37.2 C) 99 F (37.2 C)   TempSrc:  Oral Oral   SpO2: 91% 100% 97% 96%  Weight:      Height:       06/09/2017.  Patient seen and examined.  Physical exam is essentially unchanged from previous except for what is mentioned below. General: 67 year old Caucasian female well-developed well-nourished in no acute distress.  Alert and oriented x3.   HEENT: Mucous membrane moist, no erythema no drainage Heart: Regular rate and rhythm with no murmurs or rubs Lungs: Clear to auscultation with no wheezes or rales  Abdomen: Soft nontender nondistended with normal bowel sounds x4  Extremity: Right hip incision site appears clean with no drainage or erythema noted.  Mild edema noted in the right lower extremity.  Psych: Mood is appropriate for condition and setting is  Data reviewed:  I have personally reviewed following labs and imaging studies Labs:  CBC: Recent Labs  Lab 06/05/17 1645 06/06/17 0551 06/07/17 0535 06/08/17 0704 06/09/17 0549  WBC 6.4 6.7 7.9 7.0 7.6  NEUTROABS 4.7  --   --   --   --   HGB 10.7* 10.2* 9.5* 8.8* 8.8*  HCT 32.9* 31.9* 29.2* 26.6* 26.9*  MCV 89.6 91.4 91.5 90.5 90.9  PLT 343 303 275 240 937    Basic Metabolic Panel: Recent Labs  Lab 06/05/17 1645 06/06/17 0551 06/07/17 0535 06/08/17 0704  NA 137 137 138 139  K 4.1 4.1 4.2 4.2  CL 102  102 104 109  CO2 25 24 28 25   GLUCOSE 107* 73 93 101*  BUN 18 15 11 9   CREATININE 0.97 0.76 0.67 0.59  CALCIUM 8.4* 8.2* 8.3* 8.1*   GFR Estimated Creatinine Clearance: 58.8 mL/min (by C-G formula based on SCr of 0.59 mg/dL). Liver Function Tests: Recent Labs  Lab 06/05/17 1645 06/06/17 0551  AST 23 25  ALT 11* 8*  ALKPHOS 50 44  BILITOT 0.7 0.9  PROT 6.0* 5.0*  ALBUMIN 3.7 3.1*   No results for input(s): LIPASE, AMYLASE in the last 168 hours. No results for input(s): AMMONIA in the last 168 hours. Coagulation profile Recent Labs  Lab 06/06/17 0551  INR 1.00  Cardiac Enzymes: No results for input(s): CKTOTAL, CKMB, CKMBINDEX, TROPONINI in the last 168 hours. BNP: Invalid input(s): POCBNP CBG: Recent Labs  Lab 06/06/17 1446  GLUCAP 95   D-Dimer No results for input(s): DDIMER in the last 72 hours. Hgb A1c No results for input(s): HGBA1C in the last 72 hours. Lipid Profile No results for input(s): CHOL, HDL, LDLCALC, TRIG, CHOLHDL, LDLDIRECT in the last 72 hours. Thyroid function studies No results for input(s): TSH, T4TOTAL, T3FREE, THYROIDAB in the last 72 hours.  Invalid input(s): FREET3 Anemia work up No results for input(s): VITAMINB12, FOLATE, FERRITIN, TIBC, IRON, RETICCTPCT in the last 72 hours. Urinalysis    Component Value Date/Time   COLORURINE YELLOW 05/14/2017 1029   APPEARANCEUR Sl Cloudy (A) 05/14/2017 1029   LABSPEC 1.010 05/14/2017 1029   PHURINE 6.5 05/14/2017 1029   GLUCOSEU NEGATIVE 05/14/2017 1029   HGBUR SMALL (A) 05/14/2017 1029   BILIRUBINUR NEGATIVE 05/14/2017 1029   KETONESUR NEGATIVE 05/14/2017 1029   PROTEINUR NEGATIVE 11/07/2016 2359   UROBILINOGEN 0.2 05/14/2017 1029   NITRITE NEGATIVE 05/14/2017 1029   LEUKOCYTESUR LARGE (A) 05/14/2017 1029     Microbiology Recent Results (from the past 240 hour(s))  Surgical pcr screen     Status: None   Collection Time: 06/06/17  9:20 AM  Result Value Ref Range Status   MRSA,  PCR NEGATIVE NEGATIVE Final   Staphylococcus aureus NEGATIVE NEGATIVE Final    Comment: (NOTE) The Xpert SA Assay (FDA approved for NASAL specimens in patients 50 years of age and older), is one component of a comprehensive surveillance program. It is not intended to diagnose infection nor to guide or monitor treatment. Performed at [facility], [place] [address], [place], [place] [address]        Inpatient Medications:   Scheduled Meds: . acetaminophen  650 mg Oral Q6H  . amphetamine-dextroamphetamine  20 mg Oral BID AC  . apixaban  2.5 mg Oral Q12H  . baclofen  20 mg Oral TID  . diclofenac sodium  2 g Topical TID  . docusate sodium  100 mg Oral BID  . ferrous sulfate  325 mg Oral TID PC  . FLUoxetine  40 mg Oral Daily  . gabapentin  600 mg Oral BID  . levothyroxine  100 mcg Oral QAC breakfast  . mometasone-formoterol  2 puff Inhalation BID  . multivitamin with minerals  1 tablet Oral Daily  . pantoprazole  40 mg Oral Daily  . senna-docusate  1 tablet Oral BID  . sodium chloride flush  3 mL Intravenous Q12H  . temazepam  15 mg Oral QHS   Continuous Infusions: . sodium chloride 250 mL (06/08/17 0618)  . methocarbamol (ROBAXIN)  IV Stopped (06/06/17 1523)     Radiological Exams on Admission: Dg Ankle Complete Right  Result Date: 06/08/2017 CLINICAL DATA:  67 year old female with new onset right ankle pain. Medial pain. Recent right hip dislocation and reduction following manipulation. EXAM: RIGHT ANKLE - COMPLETE 3+ VIEW COMPARISON:  None. FINDINGS: Generalized soft tissue swelling. No right ankle joint effusion is evident. Preserved mortise joint alignment. Talar dome intact. Bone mineralization is within normal limits. The distal tibia and fibula appear intact. No acute osseous abnormality identified. IMPRESSION: No acute osseous abnormality identified about the right ankle. Electronically Signed   By: Genevie Ann M.D.   On: 06/08/2017 09:30     Impression/Recommendations Active Problems:   Hypothyroidism   Multiple sclerosis (HCC)   Failed total hip arthroplasty with dislocation (  HCC)   Hypotension  Hypotension, resolved Blood pressure stable and back to baseline Maintain map 65 or greater  Anemia of chronic disease Baseline hemoglobin 10 Hemoglobin is stable Continue ferrous sulfate 325 mg 3 times daily  Dislocation of right hip arthroplasty Orthopedic surgery following Eliquis for DVT prophylaxis Pain management in place, pain is well-controlled  Hypothyroidism Continue levothyroxine  GERD Continue Protonix  COPD Stable Continue home meds Dulera  Anxiety/depression Continue Prozac, temazepam  ADHD Continue Adderall  Ambulatory dysfunction Post orthopedic surgery  PT evaluated Fall precaution    Thank you for this consultation.  Our [facility] hospitalist team will sign off.  Please call if you have any additional questions.  Time Spent: 25 minutes  Kayleen Memos M.D. [facility] 06/09/2017, 10:58 AM

## 2017-06-10 NOTE — Discharge Summary (Signed)
Physician Discharge Summary   Patient ID: Kayla [name] MRN: [id number] DOB/AGE: June 24, 1950 67 y.o.  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 Arthritis    oa  . Cervical cancer ([place]) 2005  . COPD (chronic obstructive pulmonary disease) ([place])   . Multiple sclerosis ([place])   . Osteoporosis   . Other diseases of vocal cords    vocal cords are not even  . Other voice and resonance disorders   . Pneumonia 2014, 1992  . Sleep apnea    no cpap used  . Unspecified hypothyroidism    Discharge Diagnoses:   Active Problems:   Hypothyroidism   Multiple sclerosis ([place])   Failed total hip arthroplasty with dislocation ([facility])   Hypotension  Estimated body mass index is 22.85 kg/m as calculated from the following:   Height as of this encounter: '5\' 3"'  (1.6 m).   Weight as of this encounter: 58.5 kg (129 lb).  Procedure(s) (LRB): TOTAL HIP REVISION (Right)   Consults: internal medicine  HPI: Kayla [name] is a 67 y.o. female with a history of MS, COPD, vocal cord dysfunction, right sided total hip replacement, who presents today for evaluation.  She reports that she was bending over when she felt her right hip pop.  She denies any chest pain, shortness of breath.  She reports she did not fall or strike her head.  She reports that her MS causes her weakness in her legs, and hands. She has a history of right hip hemiarthroplasty which was converted to a right total hip arthroplasty. She has dislocated the right hip a couple months after surgery in 2017 but not since. She did reports feeling a pop in the hip a week before the dislocation. A closed reduction was attempted in the OR but failed.   Laboratory Data: Admission on 06/05/2017, Discharged on 06/09/2017  Component Date Value Ref Range Status  . Sodium 06/05/2017 137  135 - 145 mmol/L  Final  . Potassium 06/05/2017 4.1  3.5 - 5.1 mmol/L Final  . Chloride 06/05/2017 102  101 - 111 mmol/L Final  . CO2 06/05/2017 25  22 - 32 mmol/L Final  . Glucose, Bld 06/05/2017 107* 65 - 99 mg/dL Final  . BUN 06/05/2017 18  6 - 20 mg/dL Final  . Creatinine, Ser 06/05/2017 0.97  0.44 - 1.00 mg/dL Final  . Calcium 06/05/2017 8.4* 8.9 - 10.3 mg/dL Final  . Total Protein 06/05/2017 6.0* 6.5 - 8.1 g/dL Final  . Albumin 06/05/2017 3.7  3.5 - 5.0 g/dL Final  . AST 06/05/2017 23  15 - 41 U/L Final  . ALT 06/05/2017 11* 14 - 54 U/L Final  . Alkaline Phosphatase 06/05/2017 50  38 - 126 U/L Final  . Total Bilirubin 06/05/2017 0.7  0.3 - 1.2 mg/dL Final  . GFR calc non Af Amer 06/05/2017 >60  >60 mL/min Final  . GFR calc Af Amer 06/05/2017 >60  >60 mL/min Final   Comment: (NOTE) The eGFR has been calculated using the CKD EPI equation. This calculation has not been validated in all clinical situations. eGFR's persistently <60 mL/min signify possible Chronic Kidney Disease.   Georgiann Hahn gap 06/05/2017 10  5 - 15 Final   Performed at [facility], [place] [address] [place], [place] [address]  . WBC 06/05/2017 6.4  4.0 - 10.5 K/uL Final  . RBC  06/05/2017 3.67* 3.87 - 5.11 MIL/uL Final  . Hemoglobin 06/05/2017 10.7* 12.0 - 15.0 g/dL Final  . HCT 06/05/2017 32.9* 36.0 - 46.0 % Final  . MCV 06/05/2017 89.6  78.0 - 100.0 fL Final  . MCH 06/05/2017 29.2  26.0 - 34.0 pg Final  . MCHC 06/05/2017 32.5  30.0 - 36.0 g/dL Final  . RDW 06/05/2017 14.5  11.5 - 15.5 % Final  . Platelets 06/05/2017 343  150 - 400 K/uL Final  . Neutrophils Relative % 06/05/2017 74  % Final  . Neutro Abs 06/05/2017 4.7  1.7 - 7.7 K/uL Final  . Lymphocytes Relative 06/05/2017 15  % Final  . Lymphs Abs 06/05/2017 0.9  0.7 - 4.0 K/uL Final  . Monocytes Relative 06/05/2017 9  % Final  . Monocytes Absolute 06/05/2017 0.6  0.1 - 1.0 K/uL Final  . Eosinophils Relative 06/05/2017 2  % Final  . Eosinophils Absolute  06/05/2017 0.1  0.0 - 0.7 K/uL Final  . Basophils Relative 06/05/2017 0  % Final  . Basophils Absolute 06/05/2017 0.0  0.0 - 0.1 K/uL Final   Performed at [facility], [place] [address] [place], [place] [address]  . Sodium 06/06/2017 137  135 - 145 mmol/L Final  . Potassium 06/06/2017 4.1  3.5 - 5.1 mmol/L Final  . Chloride 06/06/2017 102  101 - 111 mmol/L Final  . CO2 06/06/2017 24  22 - 32 mmol/L Final  . Glucose, Bld 06/06/2017 73  65 - 99 mg/dL Final  . BUN 06/06/2017 15  6 - 20 mg/dL Final  . Creatinine, Ser 06/06/2017 0.76  0.44 - 1.00 mg/dL Final  . Calcium 06/06/2017 8.2* 8.9 - 10.3 mg/dL Final  . Total Protein 06/06/2017 5.0* 6.5 - 8.1 g/dL Final  . Albumin 06/06/2017 3.1* 3.5 - 5.0 g/dL Final  . AST 06/06/2017 25  15 - 41 U/L Final  . ALT 06/06/2017 8* 14 - 54 U/L Final  . Alkaline Phosphatase 06/06/2017 44  38 - 126 U/L Final  . Total Bilirubin 06/06/2017 0.9  0.3 - 1.2 mg/dL Final  . GFR calc non Af Amer 06/06/2017 >60  >60 mL/min Final  . GFR calc Af Amer 06/06/2017 >60  >60 mL/min Final   Comment: (NOTE) The eGFR has been calculated using the CKD EPI equation. This calculation has not been validated in all clinical situations. eGFR's persistently <60 mL/min signify possible Chronic Kidney Disease.   Georgiann Hahn gap 06/06/2017 11  5 - 15 Final   Performed at [facility], [place] [address] [place], [place] [address]  . Prothrombin Time 06/06/2017 13.1  11.4 - 15.2 seconds Final  . INR 06/06/2017 1.00   Final   Performed at [facility], [place] [address], [place], [place] [address]  . aPTT 06/06/2017 27  24 - 36 seconds Final   Performed at [facility], [place] [address] [place], [place] [address]  . WBC 06/06/2017 6.7  4.0 - 10.5 K/uL Final  . RBC 06/06/2017 3.49* 3.87 - 5.11 MIL/uL Final  . Hemoglobin 06/06/2017 10.2* 12.0 - 15.0 g/dL Final  . HCT 06/06/2017 31.9* 36.0 - 46.0 % Final  . MCV 06/06/2017  91.4  78.0 - 100.0 fL Final  . MCH 06/06/2017 29.2  26.0 - 34.0 pg Final  . MCHC 06/06/2017 32.0  30.0 - 36.0 g/dL Final  . RDW 06/06/2017 14.9  11.5 - 15.5 % Final  . Platelets 06/06/2017 303  150 - 400  K/uL Final   Performed at [facility], [place] [address], [place], [place] [address]  . HIV Screen 4th Generation wRfx 06/06/2017 Non Reactive  Non Reactive Final   Comment: (NOTE) Performed At: [facility] [place], [place] [id number] Rush Farmer MD [id number] Performed at [facility], [place] [address], [place], [place] [address]   . MRSA, PCR 06/06/2017 NEGATIVE  NEGATIVE Final  . Staphylococcus aureus 06/06/2017 NEGATIVE  NEGATIVE Final   Comment: (NOTE) The Xpert SA Assay (FDA approved for NASAL specimens in patients 52 years of age and older), is one component of a comprehensive surveillance program. It is not intended to diagnose infection nor to guide or monitor treatment. Performed at [facility], [place] [address] [place], [place] [address]   . Glucose-Capillary 06/06/2017 95  65 - 99 mg/dL Final  . WBC 06/07/2017 7.9  4.0 - 10.5 K/uL Final  . RBC 06/07/2017 3.19* 3.87 - 5.11 MIL/uL Final  . Hemoglobin 06/07/2017 9.5* 12.0 - 15.0 g/dL Final  . HCT 06/07/2017 29.2* 36.0 - 46.0 % Final  . MCV 06/07/2017 91.5  78.0 - 100.0 fL Final  . MCH 06/07/2017 29.8  26.0 - 34.0 pg Final  . MCHC 06/07/2017 32.5  30.0 - 36.0 g/dL Final  . RDW 06/07/2017 14.6  11.5 - 15.5 % Final  . Platelets 06/07/2017 275  150 - 400 K/uL Final   Performed at [facility], [place] [address], [place], [place] [address]  . Sodium 06/07/2017 138  135 - 145 mmol/L Final  . Potassium 06/07/2017 4.2  3.5 - 5.1 mmol/L Final  . Chloride 06/07/2017 104  101 - 111 mmol/L Final  . CO2 06/07/2017 28  22 - 32 mmol/L Final  . Glucose, Bld 06/07/2017 93  65 - 99 mg/dL Final  . BUN 06/07/2017 11  6 - 20 mg/dL Final  .  Creatinine, Ser 06/07/2017 0.67  0.44 - 1.00 mg/dL Final  . Calcium 06/07/2017 8.3* 8.9 - 10.3 mg/dL Final  . GFR calc non Af Amer 06/07/2017 >60  >60 mL/min Final  . GFR calc Af Amer 06/07/2017 >60  >60 mL/min Final   Comment: (NOTE) The eGFR has been calculated using the CKD EPI equation. This calculation has not been validated in all clinical situations. eGFR's persistently <60 mL/min signify possible Chronic Kidney Disease.   Georgiann Hahn gap 06/07/2017 6  5 - 15 Final   Performed at [facility], [place] [address], [place], [place] [address]  . Lactic Acid, Venous 06/07/2017 1.7  0.5 - 1.9 mmol/L Final   Performed at [place] [address], [place], [place] [address]  . WBC 06/08/2017 7.0  4.0 - 10.5 K/uL Final  . RBC 06/08/2017 2.94* 3.87 - 5.11 MIL/uL Final  . Hemoglobin 06/08/2017 8.8* 12.0 - 15.0 g/dL Final  . HCT 06/08/2017 26.6* 36.0 - 46.0 % Final  . MCV 06/08/2017 90.5  78.0 - 100.0 fL Final  . MCH 06/08/2017 29.9  26.0 - 34.0 pg Final  . MCHC 06/08/2017 33.1  30.0 - 36.0 g/dL Final  . RDW 06/08/2017 14.9  11.5 - 15.5 % Final  . Platelets 06/08/2017 240  150 - 400 K/uL Final   Performed at [facility], [place] [address] [place], [place] [address]  . Sodium 06/08/2017 139  135 - 145 mmol/L Final  . Potassium 06/08/2017 4.2  3.5 - 5.1 mmol/L Final  . Chloride 06/08/2017  109  101 - 111 mmol/L Final  . CO2 06/08/2017 25  22 - 32 mmol/L Final  . Glucose, Bld 06/08/2017 101* 65 - 99 mg/dL Final  . BUN 06/08/2017 9  6 - 20 mg/dL Final  . Creatinine, Ser 06/08/2017 0.59  0.44 - 1.00 mg/dL Final  . Calcium 06/08/2017 8.1* 8.9 - 10.3 mg/dL Final  . GFR calc non Af Amer 06/08/2017 >60  >60 mL/min Final  . GFR calc Af Amer 06/08/2017 >60  >60 mL/min Final   Comment: (NOTE) The eGFR has been calculated using the CKD EPI equation. This calculation has not been validated in all clinical situations. eGFR's persistently <60 mL/min  signify possible Chronic Kidney Disease.   Georgiann Hahn gap 06/08/2017 5  5 - 15 Final   Performed at [facility], [place] [address], [place], [place] [address]  . WBC 06/09/2017 7.6  4.0 - 10.5 K/uL Final  . RBC 06/09/2017 2.96* 3.87 - 5.11 MIL/uL Final  . Hemoglobin 06/09/2017 8.8* 12.0 - 15.0 g/dL Final  . HCT 06/09/2017 26.9* 36.0 - 46.0 % Final  . MCV 06/09/2017 90.9  78.0 - 100.0 fL Final  . MCH 06/09/2017 29.7  26.0 - 34.0 pg Final  . MCHC 06/09/2017 32.7  30.0 - 36.0 g/dL Final  . RDW 06/09/2017 15.4  11.5 - 15.5 % Final  . Platelets 06/09/2017 271  150 - 400 K/uL Final   Performed at [facility], [place] [address], [place], [place] [address]  Appointment on 05/14/2017  Component Date Value Ref Range Status  . WBC 05/14/2017 6.3  4.0 - 10.5 K/uL Final  . RBC 05/14/2017 3.93  3.87 - 5.11 Mil/uL Final  . Hemoglobin 05/14/2017 11.7* 12.0 - 15.0 g/dL Final  . HCT 05/14/2017 34.7* 36.0 - 46.0 % Final  . MCV 05/14/2017 88.4  78.0 - 100.0 fl Final  . MCHC 05/14/2017 33.6  30.0 - 36.0 g/dL Final  . RDW 05/14/2017 14.1  11.5 - 15.5 % Final  . Platelets 05/14/2017 319.0  150.0 - 400.0 K/uL Final  . Neutrophils Relative % 05/14/2017 63.0  43.0 - 77.0 % Final  . Lymphocytes Relative 05/14/2017 17.5  12.0 - 46.0 % Final  . Monocytes Relative 05/14/2017 18.0* 3.0 - 12.0 % Final  . Eosinophils Relative 05/14/2017 0.7  0.0 - 5.0 % Final  . Basophils Relative 05/14/2017 0.8  0.0 - 3.0 % Final  . Neutro Abs 05/14/2017 4.0  1.4 - 7.7 K/uL Final  . Lymphs Abs 05/14/2017 1.1  0.7 - 4.0 K/uL Final  . Monocytes Absolute 05/14/2017 1.1* 0.1 - 1.0 K/uL Final  . Eosinophils Absolute 05/14/2017 0.0  0.0 - 0.7 K/uL Final  . Basophils Absolute 05/14/2017 0.1  0.0 - 0.1 K/uL Final  . TSH 05/14/2017 0.82  0.35 - 4.50 uIU/mL Final  . Sodium 05/14/2017 135  135 - 145 mEq/L Final  . Potassium 05/14/2017 3.8  3.5 - 5.1 mEq/L Final  . Chloride 05/14/2017 97  96 - 112 mEq/L  Final  . CO2 05/14/2017 29  19 - 32 mEq/L Final  . Glucose, Bld 05/14/2017 101* 70 - 99 mg/dL Final  . BUN 05/14/2017 13  6 - 23 mg/dL Final  . Creatinine, Ser 05/14/2017 0.91  0.40 - 1.20 mg/dL Final  . Calcium 05/14/2017 9.2  8.4 - 10.5 mg/dL Final  . GFR 05/14/2017 65.98  >60.00 mL/min Final  . Color, Urine 05/14/2017 YELLOW  Yellow;Lt. Yellow Final  . APPearance 05/14/2017 Sl  Cloudy* Clear Final  . Specific Gravity, Urine 05/14/2017 1.010  1.000 - 1.030 Final  . pH 05/14/2017 6.5  5.0 - 8.0 Final  . Total Protein, Urine 05/14/2017 30* Negative Final  . Urine Glucose 05/14/2017 NEGATIVE  Negative Final  . Ketones, ur 05/14/2017 NEGATIVE  Negative Final  . Bilirubin Urine 05/14/2017 NEGATIVE  Negative Final  . Hgb urine dipstick 05/14/2017 SMALL* Negative Final  . Urobilinogen, UA 05/14/2017 0.2  0.0 - 1.0 Final  . Leukocytes, UA 05/14/2017 LARGE* Negative Final  . Nitrite 05/14/2017 NEGATIVE  Negative Final  . WBC, UA 05/14/2017 TNTC(>50/hpf)* 0-2/hpf Final  . RBC / HPF 05/14/2017 7-10/hpf* 0-2/hpf Final  . Squamous Epithelial / LPF 05/14/2017 Rare(0-4/hpf)  Rare(0-4/hpf) Final  . Bacteria, UA 05/14/2017 Many(>50/hpf)* None Final     X-Rays:Dg Ankle Complete Right  Result Date: 06/08/2017 CLINICAL DATA:  67 year old female with new onset right ankle pain. Medial pain. Recent right hip dislocation and reduction following manipulation. EXAM: RIGHT ANKLE - COMPLETE 3+ VIEW COMPARISON:  None. FINDINGS: Generalized soft tissue swelling. No right ankle joint effusion is evident. Preserved mortise joint alignment. Talar dome intact. Bone mineralization is within normal limits. The distal tibia and fibula appear intact. No acute osseous abnormality identified. IMPRESSION: No acute osseous abnormality identified about the right ankle. Electronically Signed   By: Genevie Ann M.D.   On: 06/08/2017 09:30   Ct Hip Right Wo Contrast  Result Date: 06/05/2017 CLINICAL DATA:  Right hip dislocation.   Unable to reduce. EXAM: CT OF THE RIGHT HIP WITHOUT CONTRAST TECHNIQUE: Multidetector CT imaging of the right hip was performed according to the standard protocol. Multiplanar CT image reconstructions were also generated. COMPARISON:  11/11/2016 CT FINDINGS: Bones/Joint/Cartilage Cephalad dislocation of the patient's right prosthetic femoral head relative to the acetabular component. No hardware failure with respect to the included right total hip arthroplasty. No fracture is identified. No impeding intra-articular loose body is identified of the right hip. No definite abnormality within the acetabular cup. No loosening of hardware is seen. The acetabular rim is maintained. No soft tissue mass or joint effusion. Ligaments Suboptimally assessed by CT. Muscles and Tendons Negative Soft tissues Coarse subcutaneous calcifications overlie the gluteal muscles bilaterally. There is physiologic distention of the urinary bladder without focal mural thickening. There is lower lumbar degenerative disc disease of the included lower lumbar spine at L4-5 and L5-S1. IMPRESSION: 1. Cephalad dislocation of right femoral head prosthesis relative to the acetabular component without impeding structures noted within the acetabular cup. No acute periprosthetic fracture. 2. Coarse dystrophic soft tissue calcifications possibly related to old remote injury overlies the included gluteal musculature. Electronically Signed   By: Ashley Royalty M.D.   On: 06/05/2017 22:10   Dg Chest Port 1 View  Result Date: 06/05/2017 CLINICAL DATA:  Hypoxia. EXAM: PORTABLE CHEST 1 VIEW COMPARISON:  Radiographs of September 24, 2015. FINDINGS: The heart size and mediastinal contours are within normal limits. No pneumothorax or pleural effusion is noted. Right lung is clear. Mild left basilar subsegmental atelectasis is noted. The visualized skeletal structures are unremarkable. IMPRESSION: Mild left basilar subsegmental atelectasis. Electronically Signed   By:  Marijo Conception, M.D.   On: 06/05/2017 16:21   Dg C-arm 1-60 Min-no Report  Result Date: 06/05/2017 Fluoroscopy was utilized by the requesting physician.  No radiographic interpretation.   Dg Hip Port Unilat With Pelvis 1v Right  Result Date: 06/06/2017 CLINICAL DATA:  RIGHT hip revision EXAM: DG HIP (WITH OR WITHOUT PELVIS) 1V  PORT RIGHT COMPARISON:  Radiograph 06/05/2016, CT 11/11/2016 FINDINGS: Interval relocation of RIGHT hip prosthetic. No fracture or dislocation. LEFT hip is located. Dystrophic calcifications in the subcutaneous tissue of the LEFT and RIGHT buttocks noted. IMPRESSION: Successful reduction of RIGHT femoral prosthetic dislocation. Electronically Signed   By: Suzy Bouchard M.D.   On: 06/06/2017 15:31   Dg Hip Unilat With Pelvis 2-3 Views Right  Result Date: 06/05/2017 CLINICAL DATA:  Right hip pain after fall and injury. EXAM: DG HIP (WITH OR WITHOUT PELVIS) 2-3V RIGHT COMPARISON:  Radiographs of November 10, 2015. FINDINGS: Superior dislocation of right femoral prosthesis relative to acetabular prosthesis is noted. No definite fracture or dislocation is noted. IMPRESSION: Superior dislocation of right femoral prosthesis. Electronically Signed   By: Marijo Conception, M.D.   On: 06/05/2017 16:23    EKG: Orders placed or performed during the hospital encounter of 06/05/17  . ED EKG  . ED EKG  . EKG 12-Lead  . EKG 12-Lead     Hospital Course: Patient was admitted to [facility] and taken to the OR and underwent the above state procedure without complications.  Patient tolerated the procedure well and was later transferred to the recovery room and then to the orthopaedic floor for postoperative care.  They were given PO and IV analgesics for pain control following their surgery.  They were given 24 hours of postoperative antibiotics of  Anti-infectives (From admission, onward)   Start     Dose/Rate Route Frequency Ordered Stop   06/06/17 1600  ceFAZolin (ANCEF) IVPB  1 g/50 mL premix     1 g 100 mL/hr over 30 Minutes Intravenous Every 6 hours 06/06/17 1549 06/06/17 2309   06/06/17 1323  polymyxin B 500,000 Units, bacitracin 50,000 Units in sodium chloride 0.9 % 500 mL irrigation  Status:  Discontinued       As needed 06/06/17 1323 06/06/17 1419   06/06/17 1315  vancomycin (VANCOCIN) IVPB 1000 mg/200 mL premix  Status:  Discontinued     1,000 mg 200 mL/hr over 60 Minutes Intravenous  Once 06/06/17 1307 06/06/17 1540   06/06/17 1219  ceFAZolin (ANCEF) 2-4 GM/100ML-% IVPB    Comments:  Cochran, Ron   : cabinet override      06/06/17 1219 06/07/17 0029   06/06/17 0600  ceFAZolin (ANCEF) IVPB 2g/100 mL premix  Status:  Discontinued     2 g 200 mL/hr over 30 Minutes Intravenous On call to O.R. 06/05/17 2033 06/05/17 2121   06/05/17 2215  azithromycin (ZITHROMAX) tablet 500 mg    Comments:  Take 2 on day one then 1 daily x 4 days     500 mg Oral  Once 06/05/17 2207 06/05/17 2247     and started on DVT prophylaxis in the form of Eliquis.   PT and OT were ordered for total hip protocol.  The patient was allowed to be WBAT with therapy. Discharge planning was consulted to help with postop disposition and equipment needs.  Patient had a fair night on the evening of surgery.  They started to get up OOB with therapy on day one.  The knee immobilizer was removed and discontinued. She had some trouble with hypotension as well as right ankle pain. Plain films of the right ankle were normal and she appeared to have some inflammation namely over the posterior tibial tendon. She was given an ASO brace fur support.  Continued to work with therapy into day two.  By day three, the  patient had progressed with therapy and meeting their goals. Medicine had cleared her from her hypotensive issues and she remained asymptomatic from that standpoint. Patient was seen in rounds and was ready to go home.   Diet: Cardiac diet Activity:WBAT No bending hip over 90 degrees- A "L" Angle Do  not cross legs Do not let foot roll inward When turning these patients a pillow should be placed between the patient's legs to prevent crossing. Patients should have the affected knee fully extended when trying to sit or stand from all surfaces to prevent excessive hip flexion. When ambulating and turning toward the affected side the affected leg should have the toes turned out prior to moving the walker and the rest of patient's body as to prevent internal rotation/ turning in of the leg. Abduction pillows are the most effective way to prevent a patient from not crossing legs or turning toes in at rest. If an abduction pillow is not ordered placing a regular pillow length wise between the patient's legs is also an effective reminder. It is imperative that these precautions be maintained so that the surgical hip does not dislocate. Follow-up:in 2 weeks Disposition - Home Discharged Condition: stable   Discharge Instructions    Call MD / Call 911   Complete by:  As directed    If you experience chest pain or shortness of breath, CALL 911 and be transported to the hospital emergency room.  If you develope a fever above 101 F, pus (white drainage) or increased drainage or redness at the wound, or calf pain, call your surgeon's office.   Constipation Prevention   Complete by:  As directed    Drink plenty of fluids.  Prune juice may be helpful.  You may use a stool softener, such as Colace (over the counter) 100 mg twice a day.  Use MiraLax (over the counter) for constipation as needed.   Diet - low sodium heart healthy   Complete by:  As directed    Discharge instructions   Complete by:  As directed    INSTRUCTIONS AFTER JOINT REPLACEMENT   Remove items at home which could result in a fall. This includes throw rugs or furniture in walking pathways ICE to the affected joint every three hours while awake for 30 minutes at a time, for at least the first 3-5 days, and then as needed for pain and  swelling.  Continue to use ice for pain and swelling. You may notice swelling that will progress down to the foot and ankle.  This is normal after surgery.  Elevate your leg when you are not up walking on it.   Continue to use the breathing machine you got in the hospital (incentive spirometer) which will help keep your temperature down.  It is common for your temperature to cycle up and down following surgery, especially at night when you are not up moving around and exerting yourself.  The breathing machine keeps your lungs expanded and your temperature down.   DIET:  As you were doing prior to hospitalization, we recommend a well-balanced diet.  DRESSING / WOUND CARE / SHOWERING  Keep the surgical dressing until follow up.  The dressing is water proof, so you can shower without any extra covering.  IF THE DRESSING FALLS OFF or the wound gets wet inside, change the dressing with sterile gauze.  Please use good hand washing techniques before changing the dressing.  Do not use any lotions or creams on the incision until instructed  by your surgeon.    ACTIVITY  Increase activity slowly as tolerated, but follow the weight bearing instructions below.   No driving for 6 weeks or until further direction given by your physician.  You cannot drive while taking narcotics.  No lifting or carrying greater than 10 lbs. until further directed by your surgeon. Avoid periods of inactivity such as sitting longer than an hour when not asleep. This helps prevent blood clots.  You may return to work once you are authorized by your doctor.     WEIGHT BEARING   Weight bearing as tolerated with assist device (walker, cane, etc) as directed, use it as long as suggested by your surgeon or therapist, typically at least 4-6 weeks.   EXERCISES  Results after joint replacement surgery are often greatly improved when you follow the exercise, range of motion and muscle strengthening exercises prescribed by your  doctor. Safety measures are also important to protect the joint from further injury. Any time any of these exercises cause you to have increased pain or swelling, decrease what you are doing until you are comfortable again and then slowly increase them. If you have problems or questions, call your caregiver or physical therapist for advice.    CONSTIPATION  Constipation is defined medically as fewer than three stools per week and severe constipation as less than one stool per week.  Even if you have a regular bowel pattern at home, your normal regimen is likely to be disrupted due to multiple reasons following surgery.  Combination of anesthesia, postoperative narcotics, change in appetite and fluid intake all can affect your bowels.   YOU MUST use at least one of the following options; they are listed in order of increasing strength to get the job done.  They are all available over the counter, and you may need to use some, POSSIBLY even all of these options:    Drink plenty of fluids (prune juice may be helpful) and high fiber foods Colace 100 mg by mouth twice a day  Senokot for constipation as directed and as needed Dulcolax (bisacodyl), take with full glass of water  Miralax (polyethylene glycol) once or twice a day as needed.  If you have tried all these things and are unable to have a bowel movement in the first 3-4 days after surgery call either your surgeon or your primary doctor.    If you experience loose stools or diarrhea, hold the medications until you stool forms back up.  If your symptoms do not get better within 1 week or if they get worse, check with your doctor.  If you experience "the worst abdominal pain ever" or develop nausea or vomiting, please contact the office immediately for further recommendations for treatment.   ITCHING:  If you experience itching with your medications, try taking only a single pain pill, or even half a pain pill at a time.  You can also use Benadryl  over the counter for itching or also to help with sleep.   TED HOSE STOCKINGS:  Use stockings on both legs until for at least 2 weeks or as directed by physician office. They may be removed at night for sleeping.  MEDICATIONS:  See your medication summary on the "After Visit Summary" that nursing will review with you.  You may have some home medications which will be placed on hold until you complete the course of blood thinner medication.  It is important for you to complete the blood thinner medication as prescribed.  PRECAUTIONS:  If you experience chest pain or shortness of breath - call 911 immediately for transfer to the hospital emergency department.   If you develop a fever greater that 101 F, purulent drainage from wound, increased redness or drainage from wound, foul odor from the wound/dressing, or calf pain - CONTACT YOUR SURGEON.                                                   FOLLOW-UP APPOINTMENTS:  If you do not already have a post-op appointment, please call the office for an appointment to be seen by your surgeon.  Guidelines for how soon to be seen are listed in your "After Visit Summary", but are typically between 1-4 weeks after surgery.  OTHER INSTRUCTIONS:  Follow posterior hip precautions  MAKE SURE YOU:  Understand these instructions.  Get help right away if you are not doing well or get worse.    Thank you for letting us be a part of your medical care team.  It is a privilege we respect greatly.  We hope these instructions will help you stay on track for a fast and full recovery!   Increase activity slowly as tolerated   Complete by:  As directed    Weight bearing as tolerated   Complete by:  As directed      Allergies as of 06/09/2017      Reactions   Tramadol    hallucinations    Iohexol Hives   Methylprednisolone Other (See Comments)   Reaction:  Decreases pts heart rate    Morphine And Related Other (See Comments)   Reaction:  Hallucinations     Oxycodone-acetaminophen Rash, Other (See Comments)   Reaction:  Hallucinations    Phenytoin Sodium Extended Rash   Zanaflex [tizanidine Hydrochloride] Rash      Medication List    STOP taking these medications   azithromycin 250 MG tablet Commonly known as:  ZITHROMAX   ciprofloxacin 500 MG tablet Commonly known as:  CIPRO   predniSONE 10 MG tablet Commonly known as:  DELTASONE     TAKE these medications   acetaminophen 650 MG CR tablet Commonly known as:  TYLENOL 8 HOUR Take 1 tablet (650 mg total) by mouth every 6 (six) hours.   amphetamine-dextroamphetamine 20 MG tablet Commonly known as:  ADDERALL Take 20 mg by mouth 2 (two) times daily.   apixaban 2.5 MG Tabs tablet Commonly known as:  ELIQUIS Take 1 tablet (2.5 mg total) by mouth every 12 (twelve) hours.   baclofen 20 MG tablet Commonly known as:  LIORESAL Take 20 mg by mouth 3 (three) times daily.   CALCIUM 600+D 600-400 MG-UNIT tablet Generic drug:  Calcium Carbonate-Vitamin D Take 1 tablet by mouth daily.   cholecalciferol 1000 units tablet Commonly known as:  VITAMIN D Take 2,000 Units by mouth daily.   DEXILANT 60 MG capsule Generic drug:  dexlansoprazole TAKE 1 CAPSULE ONCE DAILY WITH BREAKFAST.   DULERA 100-5 MCG/ACT Aero Generic drug:  mometasone-formoterol INHALE 2 PUFFS IN THE AM AND 2 PUFFS IN THE PM ABOUT 12 HOURS APART.   ferrous sulfate 325 (65 FE) MG tablet Take 1 tablet (325 mg total) by mouth 2 (two) times daily with a meal.   FLUoxetine 40 MG capsule Commonly known as:  PROZAC Take 40 mg by mouth daily.  FLUTTER Devi Use as directed   furosemide 20 MG tablet Commonly known as:  LASIX Take 1 tablet (20 mg total) by mouth daily.   gabapentin 600 MG tablet Commonly known as:  NEURONTIN Take 600 mg by mouth 2 (two) times daily.   HYDROmorphone 2 MG tablet Commonly known as:  DILAUDID Take 1 tablet (2 mg total) by mouth every 4 (four) hours as needed for severe pain.   IBU  800 MG tablet Generic drug:  ibuprofen Take 1 tablet by mouth 3 (three) times daily.   levothyroxine 100 MCG tablet Commonly known as:  SYNTHROID, LEVOTHROID TAKE 1 TABLET ONCE DAILY BEFORE BREAKFAST.   multivitamin with minerals Tabs tablet Take 1 tablet by mouth daily.   OCREVUS 300 MG/10ML injection Generic drug:  ocrelizumab Inject 300 mg into the vein every 6 (six) months.   temazepam 15 MG capsule Commonly known as:  RESTORIL Take 15 mg by mouth at bedtime.   TURMERIC PO Take 500 mg by mouth 2 (two) times daily.   VENTOLIN HFA 108 (90 Base) MCG/ACT inhaler Generic drug:  albuterol INHALE 2 PUFFS INTO LUNGS EVERY 6 HOURS AS NEEDED. What changed:  See the new instructions.            Discharge Care Instructions  (From admission, onward)        Start     Ordered   06/08/17 0000  Weight bearing as tolerated     06/08/17 0848     Follow-up Information    [name], MD. Schedule an appointment as soon as possible for a visit in 2 week(s).   Specialty:  Orthopedic Surgery Contact information: [address] [place] [address] [phone]        [facility] Follow up.   Specialty:  [place] Why:  physical therapy Contact information: [address] [place] [address] [phone]           Signed: [name], PA-C Orthopaedic Surgery 06/10/2017, 10:41 AM

## 2017-06-11 ENCOUNTER — Other Ambulatory Visit (HOSPITAL_COMMUNITY): Payer: Self-pay | Admitting: Orthopedic Surgery

## 2017-06-11 DIAGNOSIS — M7989 Other specified soft tissue disorders: Secondary | ICD-10-CM

## 2017-06-12 ENCOUNTER — Ambulatory Visit (HOSPITAL_COMMUNITY)
Admission: RE | Admit: 2017-06-12 | Discharge: 2017-06-12 | Disposition: A | Discharge status: Home / Self Care | Payer: BLUE CROSS/BLUE SHIELD | Source: Ambulatory Visit | Attending: Cardiovascular Disease | Admitting: Cardiovascular Disease

## 2017-06-12 DIAGNOSIS — M7989 Other specified soft tissue disorders: Secondary | ICD-10-CM | POA: Diagnosis not present

## 2017-07-15 ENCOUNTER — Other Ambulatory Visit: Payer: Self-pay | Admitting: Otolaryngology

## 2017-07-15 DIAGNOSIS — E041 Nontoxic single thyroid nodule: Secondary | ICD-10-CM

## 2017-07-16 ENCOUNTER — Other Ambulatory Visit: Payer: Self-pay | Admitting: Internal Medicine

## 2017-07-20 ENCOUNTER — Other Ambulatory Visit: Payer: Self-pay | Admitting: Internal Medicine

## 2017-07-21 ENCOUNTER — Ambulatory Visit
Admission: RE | Admit: 2017-07-21 | Discharge: 2017-07-21 | Disposition: A | Discharge status: Home / Self Care | Payer: BLUE CROSS/BLUE SHIELD | Source: Ambulatory Visit | Attending: Otolaryngology | Admitting: Otolaryngology

## 2017-07-21 DIAGNOSIS — E041 Nontoxic single thyroid nodule: Secondary | ICD-10-CM

## 2017-07-22 DIAGNOSIS — H35422 Microcystoid degeneration of retina, left eye: Secondary | ICD-10-CM | POA: Diagnosis not present

## 2017-07-22 DIAGNOSIS — H43811 Vitreous degeneration, right eye: Secondary | ICD-10-CM | POA: Diagnosis not present

## 2017-07-22 DIAGNOSIS — H35372 Puckering of macula, left eye: Secondary | ICD-10-CM | POA: Diagnosis not present

## 2017-08-11 ENCOUNTER — Encounter: Payer: Self-pay | Admitting: Internal Medicine

## 2017-08-11 ENCOUNTER — Ambulatory Visit (INDEPENDENT_AMBULATORY_CARE_PROVIDER_SITE_OTHER): Payer: Medicare Other | Admitting: Internal Medicine

## 2017-08-11 VITALS — BP 122/70 | HR 56 | Ht 63.0 in | Wt 125.6 lb

## 2017-08-11 DIAGNOSIS — M25571 Pain in right ankle and joints of right foot: Secondary | ICD-10-CM | POA: Diagnosis not present

## 2017-08-11 DIAGNOSIS — J383 Other diseases of vocal cords: Secondary | ICD-10-CM

## 2017-08-11 DIAGNOSIS — G8929 Other chronic pain: Secondary | ICD-10-CM | POA: Diagnosis not present

## 2017-08-11 DIAGNOSIS — M810 Age-related osteoporosis without current pathological fracture: Secondary | ICD-10-CM

## 2017-08-11 DIAGNOSIS — J45991 Cough variant asthma: Secondary | ICD-10-CM | POA: Diagnosis not present

## 2017-08-11 DIAGNOSIS — Z780 Asymptomatic menopausal state: Secondary | ICD-10-CM

## 2017-08-11 NOTE — Progress Notes (Signed)
Subjective:    Patient ID: Kayla [name], female    DOB: Dec 15, 1950   MRN: [id number]   Brief patient profile:  18 yowf never smoker with MS since Oct 96 all neuro care through Dr [name] in [facility]),   [name] at [facility] urology,  [facility]  Followed here for Primary care/hypothyroid     History of Present Illness  October 04, 2009 Followup with PFT's. Pt states that her breathing is no better since last seen June 2011 for acute visit. She states she is still wheezing- ventolin helps and is using this up to tid. w/u c/w anemia  Mild dysphagia no more than usual. occ waking up wheezing at night despite zegerid at hs.   rec  Try  use dulera 100 2 bid prn   September 18 2015  R THR [name]    10/01/2015  f/u ov/[name] re: sob s/p et on dulera 100 2bid and dexilant q am  Chief Complaint  Patient presents with  . Acute Visit    Pt c/o increased DOE since had hip replacement 09/18/15. She states she gets SOB with just talking, showering or walking short distances.   has cpap machine but not using at all  - prev eval and rx by Dr [name]  rec Add pepcid 20 mg x one at bedtime Prednisone 10 mg take  4 each am x 2 days,   2 each am x 2 days,  1 each am x 2 days and stop  Start back on cpap See Dr [name] asap   11/06/2015  f/u ov/[name] re:  cpx  Chief Complaint  Patient presents with  . Follow-up    cpx- pt states she is doing well today, no complaints.     breathing better at night on cpap per Dr [name]  Doe = wm shopping / was not limited at curves prior to hip surgery but has not resumed saba once a week / no cough  rec Prevnar 13 today Please remember to go to the lab  department downstairs for your tests - we will call you with the results when they are available. 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 Follow-up    Doing well and denies any co's today.   back in CPAP as of July 2017  > no change in symptoms but wakes her up a lot / f/u per Dr [name] planned  rec See Dr [name] for all issues related to your cpap machine but it appears to be working fine by your download today  No change in medications and recs     08/08/2016  f/u ov/[name] re: multiple concerns 1) check thyroid - clinically no symptoms 2) feet stay cold/ blue with neg vascular w/u to date 3) wants referral to sports medicine as needs stay hyperextended due to MS/ no problem with falling just using cane Rec Please see patient coordinator before you leave today  to schedule referal to sports medicine and vascular surgery Please remember to go to the lab department downstairs in the basement  for your tests - we will call you with the results when they are available. Please schedule a follow up visit in 3 months but call sooner if needed  11/10/2016  f/u ov/[name] re: hyponatremia  Chief Complaint  Patient presents with  . Follow-up    recent visit to ED- 11/07/16 for hyponatremia- sodium level was 123.   Bladder dysfunction / foley dep since 11/04/16 > f/u McD[place]odt Knee hyperext better with support  Hyponatremia/ no symptoms on  Lasix daily  cpap d/c 'd 09/03/2016 with RLS not bothersome to her > neurology f/u  Asthma vs vcd    dulera 100 2bid / hfa with some sense of globus but no cough or increased sob/ noct symptoms or need for saba  rec Plan A = Automatic = dulera 100 2 pffs each am and then only take the pm the dose if you are not doing great and/or needing your rescue inhaler  Work on inhaler technique:      Plan B = Backup Only use your albuterol (ventolin) as a rescue medication   Drink gatorade if you need to push more fluids for any reason   08/11/2017  f/u ov/[name] re: f/u cough with report of new  r ankle pain./swelling  since early march 2019  Chief Complaint  Patient  presents with  . Follow-up    Cough has resolved. She is having some issues with pain and swelling in her right foot.    Dyspnea:  Limited by R ankle pain / sp R hip dislocation Cough: gone Sleep: L side or back hoziontally SABA use:  None while on dulera 100 x 2 each am   Alvan Dame saw twice for ankle pain shortly after abrupt onset s h/o trauma  and neg xrays and venous dopplers with rec for nsaids but still  difficult to bear wt but also hurts even with elevation/ tried both aleve and advil s benefit  No obvious day to day or daytime variability or assoc excess/ purulent sputum or mucus plugs or hemoptysis or cp or chest tightness, subjective wheeze or overt sinus or hb symptoms. No unusual exposure hx or h/o childhood pna/ asthma or knowledge of premature birth.  Sleeping  On L side or flat on back   without nocturnal  or early am exacerbation  of respiratory  c/o's or need for noct saba. Also denies any obvious fluctuation of symptoms with weather or environmental changes or other aggravating or alleviating factors except as outlined above   Current Allergies, Complete Past Medical History, Past Surgical History, Family History, and Social History were reviewed in [employer] record.  ROS  The following are not active complaints unless bolded Hoarseness, sore throat, dysphagia, dental problems, itching, sneezing,  nasal congestion or discharge of excess mucus or purulent secretions, ear ache,   fever, chills, sweats, unintended wt loss or wt gain, classically pleuritic or exertional cp,  orthopnea pnd or arm/hand swelling  or leg swelling, presyncope, palpitations, abdominal pain, anorexia, nausea, vomiting, diarrhea  or change in bowel habits or change in bladder habits, change in stools or change in urine, dysuria, hematuria,  rash, arthralgias, visual complaints, headache, numbness, weakness or ataxia or problems with walking or coordination,  change in mood or  memory.         Current Meds  Medication Sig  . amphetamine-dextroamphetamine (ADDERALL) 20 MG tablet Take 20 mg by mouth 2 (two) times daily.   . baclofen (LIORESAL) 20 MG tablet Take 20 mg by mouth 3 (three) times daily.   . Calcium Carbonate-Vitamin D (CALCIUM 600+D) 600-400 MG-UNIT tablet Take 1 tablet by mouth daily.  . cholecalciferol (VITAMIN D) 1000 UNITS  tablet Take 2,000 Units by mouth daily.   Marland Kitchen DEXILANT 60 MG capsule TAKE 1 CAPSULE ONCE DAILY WITH BREAKFAST.  . DULERA 100-5 MCG/ACT AERO INHALE 2 PUFFS IN THE AM AND 2 PUFFS IN THE PM ABOUT 12 HOURS APART.  Marland Kitchen FLUoxetine (PROZAC) 40 MG capsule Take 40 mg by mouth daily.    . furosemide (LASIX) 20 MG tablet Take 1 tablet (20 mg total) by mouth daily.  Marland Kitchen gabapentin (NEURONTIN) 600 MG tablet Take 600 mg by mouth 2 (two) times daily.  Marland Kitchen levothyroxine (SYNTHROID, LEVOTHROID) 100 MCG tablet TAKE 1 TABLET ONCE DAILY BEFORE BREAKFAST.  . Multiple Vitamin (MULTIVITAMIN WITH MINERALS) TABS tablet Take 1 tablet by mouth daily.  . naproxen sodium (ALEVE) 220 MG tablet Take 220 mg by mouth 2 (two) times daily with a meal.  . ocrelizumab (OCREVUS) 300 MG/10ML injection Inject 300 mg into the vein every 6 (six) months.   . Respiratory Therapy Supplies (FLUTTER) DEVI Use as directed  . temazepam (RESTORIL) 15 MG capsule Take 15 mg by mouth at bedtime.   . TURMERIC PO Take 500 mg by mouth 2 (two) times daily.   . VENTOLIN HFA 108 (90 Base) MCG/ACT inhaler INHALE 2 PUFFS INTO LUNGS EVERY 6 HOURS AS NEEDED.                        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  [place]  Bladder dysfunction ...............................................Marland Kitchen  Mc[place]ermodt  Dyspnea/wheeze ? asthma  - HFA 50 % after coaching October 04, 2009 >  50% 06/25/2010  - PFT's October 04, 2009 FEV1 2.15 (95%) with ratio 69 and DLCO 58> corrects to 98, mild insp truncation  Health Maintenance.............................................Marland KitchenWert  - Pneumovax March 28, 2009 and Prevnar 13 11/06/2015  - Td  April 10, 2009  - CPX 11/06/2015        Objective:   Physical Exam  amb wf using 2 wheeled walker now instead of cane due to R ankle pain on wt bearing   Vital signs reviewed - Note on arrival 02 sats  98% on RA    Wt  118 > 127 March 28, 2009 >129 June 17 > 128 October 04, 2009 > 129 06/25/2010 >125 10/29/2010 > 128 11/26/2010 >>127 12/10/2010 > 11/17/2011  129> 126 05/20/2012 > 07/29/2012  129 > 10/20/2012  129 >  135 06/21/2013 >  11/04/2013 135 >134 04/04/2014 > 04/18/2014 135 >  07/17/2014 132> 08/17/2014  135 > 12/20/2014 > 01/17/2015 134   > 08/08/2015   126 >  10/01/2015   130 > 11/06/2015   130 > 11/10/2016  123 > 02/10/2017  133  > 05/14/2017   129 > 08/11/2017  125       HEENT: nl dentition, turbinates bilaterally, and oropharynx. Nl external ear canals without cough reflex   NECK :  without JVD/Nodes/TM/ nl carotid upstrokes bilaterally   LUNGS: no acc muscle use,  Nl contour chest  With upper airway noises only without cough on insp or exp maneuvers   CV:  RRR  no s3 or murmur or increase in P2, and no edema   ABD:  soft and nontender with  nl inspiratory excursion in the supine position. No bruits or organomegaly appreciated, bowel sounds nl  MS:  Nl gait/ ext warm without deformities, calf tenderness, cyanosis or clubbing  she continues to have purpose discoloration of both feet with minimal swelling and tenderness about the right ankle with full range of motion.  SKIN: warm and dry without lesions    NEURO:  alert, approp, nl sensorium with  no  motor or cerebellar deficits apparent.                  Assessment & Plan:

## 2017-08-11 NOTE — Patient Instructions (Addendum)
Please see patient coordinator before you leave today  to schedule referral to Dr Lynann Bologna and bone density   Please schedule a follow up visit in 3 months but call sooner if needed

## 2017-08-12 ENCOUNTER — Encounter: Payer: Self-pay | Admitting: Internal Medicine

## 2017-08-12 NOTE — Assessment & Plan Note (Addendum)
Previously managed by GYN Dr. Ree Edman on oral  Bisphonates for many years then changed to Prolia- last injection 05/2010  BMD 08/14/09 Left Neck T score -2.9 , Spine nml .(no T score provided) , recommended drug holiday.  >>12/23/2010 BMD scores reviewed, discussion for Drug holiday.  - DEXA repeat 06/15/13  > - 2.8 =  Pos Osteoporosis > rec reclast  -Reclast rx >06/21/2013  Apparently did not ever repeat this  - Bone density ordered 08/11/2017 > repeat reclast yearly x 3 if qualified as given her tendency to esophageal dysfunction I would not be tempted to use oral biphosphonates.    Discussed in detail all the  indications, usual  risks and alternatives  relative to the benefits with patient who agrees to proceed with w/u as outlined.

## 2017-08-12 NOTE — Assessment & Plan Note (Signed)
Improved on present rx which includes low-dose inhaled steroids and high dose PPI as well as gabapentin.

## 2017-08-12 NOTE — Assessment & Plan Note (Signed)
Refer to [facility] for second opinion

## 2017-08-12 NOTE — Assessment & Plan Note (Addendum)
11/10/2016  After extensive coaching HFA effectiveness =    75% from a baseline of 50%  - 11/10/2016 rec try just taking 2 pffs each am since having sense of globus on 2bid with baseline poor technique - flutter valve rec 05/14/2017    All goals of chronic asthma control met including optimal function and elimination of symptoms with minimal need for rescue therapy.  Contingencies discussed in full including contacting this office immediately if not controlling the symptoms using the rule of two's.      I had an extended discussion with the patient reviewing all relevant studies completed to date and  lasting 15 to 20 minutes of a 25 minute visit    Each maintenance medication was reviewed in detail including most importantly the difference between maintenance and prns and under what circumstances the prns are to be triggered using an action plan format that is not reflected in the computer generated alphabetically organized AVS.    Please see AVS for specific instructions unique to this visit that I personally wrote and verbalized to the the pt in detail and then reviewed with pt  by my nurse highlighting any  changes in therapy recommended at today's visit to their plan of care.

## 2017-08-18 DIAGNOSIS — M8589 Other specified disorders of bone density and structure, multiple sites: Secondary | ICD-10-CM | POA: Diagnosis not present

## 2017-08-19 ENCOUNTER — Telehealth: Payer: Self-pay | Admitting: Internal Medicine

## 2017-08-19 DIAGNOSIS — E039 Hypothyroidism, unspecified: Secondary | ICD-10-CM | POA: Diagnosis not present

## 2017-08-19 DIAGNOSIS — G35 Multiple sclerosis: Secondary | ICD-10-CM | POA: Diagnosis not present

## 2017-08-19 DIAGNOSIS — D72819 Decreased white blood cell count, unspecified: Secondary | ICD-10-CM | POA: Diagnosis not present

## 2017-08-19 DIAGNOSIS — K219 Gastro-esophageal reflux disease without esophagitis: Secondary | ICD-10-CM | POA: Diagnosis not present

## 2017-08-19 NOTE — Telephone Encounter (Signed)
Per MW- DEXA (done at [facility] on 08/18/17) reviewed and overall looks improved compared to previous study  Spoke with pt and notified of results per Dr. Melvyn Novas. Pt verbalized understanding and denied any questions.

## 2017-08-27 ENCOUNTER — Other Ambulatory Visit: Payer: Self-pay | Admitting: Internal Medicine

## 2017-09-02 ENCOUNTER — Ambulatory Visit: Payer: BLUE CROSS/BLUE SHIELD | Admitting: Internal Medicine

## 2017-09-04 DIAGNOSIS — Z4732 Aftercare following explantation of hip joint prosthesis: Secondary | ICD-10-CM | POA: Diagnosis not present

## 2017-11-11 ENCOUNTER — Ambulatory Visit: Payer: Medicare Other | Admitting: Internal Medicine

## 2017-11-24 ENCOUNTER — Ambulatory Visit (INDEPENDENT_AMBULATORY_CARE_PROVIDER_SITE_OTHER): Payer: Medicare Other | Admitting: Internal Medicine

## 2017-11-24 ENCOUNTER — Encounter: Payer: Self-pay | Admitting: Internal Medicine

## 2017-11-24 VITALS — BP 118/62 | HR 96 | Ht 63.0 in | Wt 127.2 lb

## 2017-11-24 DIAGNOSIS — M25571 Pain in right ankle and joints of right foot: Secondary | ICD-10-CM | POA: Diagnosis not present

## 2017-11-24 DIAGNOSIS — J45991 Cough variant asthma: Secondary | ICD-10-CM | POA: Diagnosis not present

## 2017-11-24 DIAGNOSIS — M7989 Other specified soft tissue disorders: Secondary | ICD-10-CM | POA: Diagnosis not present

## 2017-11-24 DIAGNOSIS — E038 Other specified hypothyroidism: Secondary | ICD-10-CM

## 2017-11-24 NOTE — Progress Notes (Signed)
Subjective:    Kayla [name] ID: Kayla Kayla [name], female    DOB: Dec 15, 1950   MRN: [id number]   Brief Kayla [name] profile:  18 yowf never smoker with MS since Oct 96 all neuro care through Dr [name] in [facility]   Kayla Kayla [name] at [facility],  [facility] [facility]  Followed here for Primary care/hypothyroid     History of Present Illness  October 04, 2009 Followup with PFT's. Pt states that her breathing is no better since last seen June 2011 for acute visit. She states she is still wheezing- ventolin helps and is using this up to tid. w/u c/w anemia  Mild dysphagia no more than usual. occ waking up wheezing at night despite zegerid at hs.   rec  Try  use dulera 100 2 bid prn   September 18 2015  R THR Kayla [name]    10/01/2015  f/u ov/Kayla Kayla [name] re: sob s/p et on dulera 100 2bid and dexilant q am  Chief Complaint  Kayla [name] presents with  . Acute Visit    Pt c/o increased DOE since had hip replacement 09/18/15. She states she gets SOB with just talking, showering or walking short distances.   has cpap machine but not using at all  - prev eval and rx by Dr [name]  rec Add pepcid 20 mg x one at bedtime Prednisone 10 mg take  4 each am x 2 days,   2 each am x 2 days,  1 each am x 2 days and stop  Start back on cpap See Dr [name] asap   11/06/2015  f/u ov/Kayla Kayla [name] re:  cpx  Chief Complaint  Kayla [name] presents with  . Follow-up    cpx- pt states she is doing well today, no complaints.     breathing better at night on cpap per Dr [name]  Doe = wm shopping / was not limited at curves prior to hip surgery but has not resumed saba once a week / no cough  rec Prevnar 13 today Please remember to go to Kayla lab  department downstairs for your tests - we will call you with Kayla results when they are available. 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 Follow-up    Doing well and denies any co's today.   back in CPAP as of July 2017  > no change in symptoms but wakes her up a lot / f/u per Dr [name] planned  rec See Dr [name] for all issues related to your cpap machine but it appears to be working fine by your download today  No change in medications and recs     08/08/2016  f/u ov/Kayla Kayla [name] re: multiple concerns 1) check thyroid - clinically no symptoms 2) feet stay cold/ blue with neg vascular w/u to date 3) wants referral to sports medicine as needs stay hyperextended due to MS/ no problem with falling just using cane Rec Please see Kayla [name] coordinator before you leave today  to schedule referal to sports medicine and vascular surgery Please remember to go to Kayla lab department downstairs in Kayla basement  for your tests - we will call you with Kayla results when they are available. Please schedule a follow up visit in 3 months but call sooner if needed  11/10/2016  f/u ov/Kayla Kayla [name] re: hyponatremia  Chief Complaint  Kayla [name] presents with  . Follow-up    recent visit to ED- 11/07/16 for hyponatremia- sodium level was 123.   Bladder dysfunction / foley dep since 11/04/16 > f/u Kayla Kayla [name] Knee hyperext better with support  Hyponatremia/ no symptoms on  Lasix daily  cpap d/c 'd 09/03/2016 with RLS not bothersome to her > neurology f/u  Asthma vs vcd    dulera 100 2bid / hfa with some sense of globus but no cough or increased sob/ noct symptoms or need for saba  rec Plan A = Automatic = dulera 100 2 pffs each am and then only take Kayla pm Kayla dose if you are not doing great and/or needing your rescue inhaler  Work on inhaler technique:      Plan B = Backup Only use your albuterol (ventolin) as a rescue medication   Drink gatorade if you need to push more fluids for any reason   08/11/2017  f/u ov/Kayla Kayla [name] re: f/u cough with report of new  r ankle pain./swelling  since early march 2019  Chief Complaint  Kayla [name]  presents with  . Follow-up    Cough has resolved. She is having some issues with pain and swelling in her right foot.    Dyspnea:  Limited by R ankle pain / sp R hip dislocation Cough: gone Sleep: L side or back hoziontally SABA use:  None while on dulera 100 x 2 each am  rec Refer to Kayla Kayla [name] did not go     11/24/2017  f/u ov/Kayla Kayla [name] re: asthma/ vcd from MS and chronic R ankle/foot pain  Chief Complaint  Kayla [name] presents with  . Follow-up    complaining of occ. cough w/ white mucus,feels like something is constantly in her throat  Dyspnea:  Not limited by breathing from desired activities  But by orthopedic restrictions = esp up and down steps R ankle Cough: mucus needs to clear more day than noct s am flare Sleeping: L side but flat SABA use: rare 02: none     No obvious day to day or daytime variability or assoc  purulent sputum or mucus plugs or hemoptysis or cp or chest tightness, subjective wheeze or overt sinus or hb symptoms.   Sleeping fine  without nocturnal  or early am exacerbation  of respiratory  c/o's or need for noct saba. Also denies any obvious fluctuation of symptoms with weather or environmental changes or other aggravating or alleviating factors except as outlined above   No unusual exposure hx or h/o childhood pna/ asthma or knowledge of premature birth.  Current Allergies, Complete Past Medical History, Past Surgical History, Family History, and Social History were reviewed in [employer] record.  ROS  Kayla following are not active complaints unless bolded Hoarseness, sore throat/ sense of globus, dysphagia, dental problems, itching, sneezing,  nasal congestion or discharge of excess mucus or purulent secretions, ear ache,   fever, chills, sweats, unintended wt loss or wt gain, classically pleuritic or exertional cp,  orthopnea pnd or arm/hand swelling  or leg swelling, presyncope, palpitations, abdominal pain, anorexia, nausea, vomiting,  diarrhea  or change in bowel habits or change in bladder habits, change in stools or change in urine, dysuria, hematuria,  rash, arthralgias, visual complaints, headache, numbness, weakness or ataxia or problems with walking or coordination,  change in mood or  memory.        Current Meds  Medication Sig  . amphetamine-dextroamphetamine (ADDERALL)  20 MG tablet Take 20 mg by mouth 2 (two) times daily.   . baclofen (LIORESAL) 20 MG tablet Take 20 mg by mouth 3 (three) times daily.   . Calcium Carbonate-Vitamin D (CALCIUM 600+D) 600-400 MG-UNIT tablet Take 1 tablet by mouth daily.  . cholecalciferol (VITAMIN D) 1000 UNITS tablet Take 2,000 Units by mouth daily.   Marland Kitchen DEXILANT 60 MG capsule TAKE 1 CAPSULE ONCE DAILY WITH BREAKFAST.  . DULERA 100-5 MCG/ACT AERO INHALE 2 PUFFS IN Kayla AM AND 2 PUFFS IN Kayla PM ABOUT 12 HOURS APART.  Marland Kitchen FLUoxetine (PROZAC) 40 MG capsule Take 40 mg by mouth daily.    . furosemide (LASIX) 20 MG tablet Take 1 tablet (20 mg total) by mouth daily.  Marland Kitchen gabapentin (NEURONTIN) 600 MG tablet Take 600 mg by mouth 2 (two) times daily.  Marland Kitchen levothyroxine (SYNTHROID, LEVOTHROID) 100 MCG tablet TAKE 1 TABLET ONCE DAILY BEFORE BREAKFAST.  . Multiple Vitamin (MULTIVITAMIN WITH MINERALS) TABS tablet Take 1 tablet by mouth daily.  . naproxen sodium (ALEVE) 220 MG tablet Take 220 mg by mouth 2 (two) times daily with a meal.  . ocrelizumab (OCREVUS) 300 MG/10ML injection Inject 300 mg into Kayla vein every 6 (six) months.   . Respiratory Therapy Supplies (FLUTTER) DEVI Use as directed  . temazepam (RESTORIL) 15 MG capsule Take 15 mg by mouth at bedtime.   . VENTOLIN HFA 108 (90 Base) MCG/ACT inhaler INHALE 2 PUFFS INTO LUNGS EVERY 6 HOURS AS NEEDED.  . [DISCONTINUED] TURMERIC PO Take 500 mg by mouth 2 (two) times daily.                  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  Bates  Bladder dysfunction ...............................................Marland Kitchen  Kayla Kayla [name]  Dyspnea/wheeze ? asthma  - HFA 50 % after coaching October 04, 2009 >  50% 06/25/2010  - PFT's October 04, 2009 FEV1 2.15 (95%) with ratio 69 and DLCO 58> corrects to 98, mild insp truncation  Health Maintenance.............................................Marland KitchenWert  - Pneumovax March 28, 2009 and Prevnar 13 11/06/2015  - Td  April 10, 2009  - CPX 11/06/2015        Objective:   Physical Exam  amb wf using 2 wheeled walker   Vital signs reviewed - Note on arrival 02 sats  99% on RA    Wt  118 > 127 March 28, 2009 >129 June 17 > 128 October 04, 2009 > 129 06/25/2010 >125 10/29/2010 > 128 11/26/2010 >>127 12/10/2010 > 11/17/2011  129> 126 05/20/2012 > 07/29/2012  129 > 10/20/2012  129 >  135 06/21/2013 >  11/04/2013 135 >134 04/04/2014 > 04/18/2014 135 >  07/17/2014 132> 08/17/2014  135 > 12/20/2014 > 01/17/2015 134   > 08/08/2015   126 >  10/01/2015   130 > 11/06/2015   130 > 11/10/2016  123 > 02/10/2017  133  > 05/14/2017   129 > 08/11/2017  125 > 11/24/2017  127            HEENT: nl dentition, turbinates bilaterally, and oropharynx. Nl external ear canals without cough reflex   NECK :  without JVD/Nodes/TM/ nl carotid upstrokes bilaterally   LUNGS: no acc  muscle use,  Nl contour chest with upper airway noises insp> exp transmitted bilaterally without cough on insp or exp maneuvers   CV:  RRR  no s3 or murmur or increase in P2, and  Min swelling of both feet/ slt dusky appearance = chronic   ABD:  soft and nontender with nl inspiratory excursion in Kayla supine position. No bruits or organomegaly appreciated, bowel sounds  nl  MS:  Awkward  Gait with 2 wheeled walker/ ext warm without deformities, calf tenderness, cyanosis or clubbing No obvious joint restrictions   SKIN: warm and dry without lesions    NEURO:  alert, approp, nl sensorium with  no motor or cerebellar deficits apparent.          Assessment & Plan:

## 2017-11-24 NOTE — Patient Instructions (Addendum)
Please see patient coordinator before you leave today  to schedule [facility] evaluation for R ankle pain as a second opinion  GERD (REFLUX)  is an extremely common cause of respiratory symptoms just like yours , many times with no obvious heartburn at all.    It can be treated with medication, but also with lifestyle changes including elevation of the head of your bed (ideally with 6 inch  bed blocks),  Smoking cessation, avoidance of late meals, excessive alcohol, and avoid fatty foods, chocolate, peppermint, colas, red wine, and acidic juices such as orange juice.  NO MINT OR MENTHOL PRODUCTS SO NO COUGH DROPS  USE SUGARLESS CANDY INSTEAD (Jolley ranchers or Stover's or Life Savers) or even ice chips will also do - the key is to swallow to prevent all throat clearing. NO OIL BASED VITAMINS - use powdered substitutes.      Please schedule a follow up visit in 3 months but call sooner if needed

## 2017-11-25 ENCOUNTER — Encounter: Payer: Self-pay | Admitting: Internal Medicine

## 2017-11-25 NOTE — Assessment & Plan Note (Signed)
-   try off neurontin 08/17/14 > did not do > rec that she discuss this with her neurologist 12/20/2014  - art/venous dopplers rec 08/10/2015 > nl arterial flows  No change in symptoms or exam

## 2017-11-25 NOTE — Assessment & Plan Note (Signed)
11/10/2016  After extensive coaching HFA effectiveness =    75% from a baseline of 50%  - 11/10/2016 rec try just taking 2 pffs each am since having sense of globus on 2bid with baseline poor technique - flutter valve rec 05/14/2017   Despite globus and pseudowheeze on exam :  All goals of chronic asthma control met including optimal function and elimination of symptoms with minimal need for rescue therapy.  Contingencies discussed in full including contacting this office immediately if not controlling the symptoms using the rule of two's.

## 2017-11-25 NOTE — Assessment & Plan Note (Signed)
Refer to [facility] for second opinion again 11/24/2017      I had an extended discussion with the patient reviewing all relevant studies completed to date and  lasting 15 to 20 minutes of a 25 minute visit    Each maintenance medication was reviewed in detail including most importantly the difference between maintenance and prns and under what circumstances the prns are to be triggered using an action plan format that is not reflected in the computer generated alphabetically organized AVS.     Please see AVS for specific instructions unique to this visit that I personally wrote and verbalized to the the pt in detail and then reviewed with pt  by my nurse highlighting any  changes in therapy recommended at today's visit to their plan of care.

## 2017-11-25 NOTE — Assessment & Plan Note (Signed)
Lab Results  Component Value Date   TSH 0.82 05/14/2017     Clinically and chemically euthyroid, no change rx

## 2017-12-02 DIAGNOSIS — M25571 Pain in right ankle and joints of right foot: Secondary | ICD-10-CM | POA: Diagnosis not present

## 2017-12-04 DIAGNOSIS — M25571 Pain in right ankle and joints of right foot: Secondary | ICD-10-CM | POA: Diagnosis not present

## 2017-12-08 DIAGNOSIS — S86011A Strain of right Achilles tendon, initial encounter: Secondary | ICD-10-CM | POA: Diagnosis not present

## 2017-12-29 DIAGNOSIS — S86011D Strain of right Achilles tendon, subsequent encounter: Secondary | ICD-10-CM | POA: Diagnosis not present

## 2018-01-14 ENCOUNTER — Other Ambulatory Visit: Payer: Self-pay | Admitting: Internal Medicine

## 2018-01-26 DIAGNOSIS — S86011D Strain of right Achilles tendon, subsequent encounter: Secondary | ICD-10-CM | POA: Diagnosis not present

## 2018-02-16 DIAGNOSIS — S86011D Strain of right Achilles tendon, subsequent encounter: Secondary | ICD-10-CM | POA: Diagnosis not present

## 2018-02-23 ENCOUNTER — Encounter: Payer: Self-pay | Admitting: Internal Medicine

## 2018-02-23 ENCOUNTER — Ambulatory Visit (INDEPENDENT_AMBULATORY_CARE_PROVIDER_SITE_OTHER): Payer: Medicare Other | Admitting: Internal Medicine

## 2018-02-23 VITALS — BP 120/72 | HR 81 | Ht 63.0 in | Wt 127.0 lb

## 2018-02-23 DIAGNOSIS — J45991 Cough variant asthma: Secondary | ICD-10-CM | POA: Diagnosis not present

## 2018-02-23 DIAGNOSIS — Z23 Encounter for immunization: Secondary | ICD-10-CM

## 2018-02-23 DIAGNOSIS — E038 Other specified hypothyroidism: Secondary | ICD-10-CM

## 2018-02-23 NOTE — Patient Instructions (Addendum)
Ok to [employer] doctor do the TSH and call the results to me  I strongly recommend the bed blocks 6-8 in is fine    Please schedule a follow up visit in 6  months but call sooner if needed - bring inhalers with you

## 2018-02-23 NOTE — Progress Notes (Signed)
Subjective:    Patient ID: Kayla [name], female    DOB: April 05, 1950   MRN: [id number]   Brief patient profile:  66 yowf never smoker with MS since Oct 96 all neuro care through Dr [name] in [facility] ([facility]),   Kayla [name] at [facility] urology,  Kayla [name] ortho  Followed here for Primary care/hypothyroid     History of Present Illness  October 04, 2009 Followup with PFT's. Pt states that her breathing is no better since last seen June 2011 for acute visit. She states she is still wheezing- ventolin helps and is using this up to tid. w/u c/w anemia  Mild dysphagia no more than usual. occ waking up wheezing at night despite zegerid at hs.   rec  Try  use dulera 100 2 bid prn   September 18 2015  R THR [name]    10/01/2015  f/u ov/Ozie Dimaria re: sob s/p et on dulera 100 2bid and dexilant q am  Chief Complaint  Patient presents with  . Acute Visit    Pt c/o increased DOE since had hip replacement 09/18/15. She states she gets SOB with just talking, showering or walking short distances.   has cpap machine but not using at all  - prev eval and rx by Dr [name]  rec Add pepcid 20 mg x one at bedtime Prednisone 10 mg take  4 each am x 2 days,   2 each am x 2 days,  1 each am x 2 days and stop  Start back on cpap See Dr [name] asap   11/06/2015  f/u ov/Kayla [name] re:  cpx  Chief Complaint  Patient presents with  . Follow-up    cpx- pt states she is doing well today, no complaints.     breathing better at night on cpap per Dr [name]  Doe = wm shopping / was not limited at [employer] prior to hip surgery but has not resumed saba once a week / no cough  rec Prevnar 13 today Please remember to go to the lab  department downstairs for your tests - we will call you with the results when they are available. 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 Follow-up    Doing well and denies any co's today.   back in CPAP as of July 2017  > no change in symptoms but wakes her up a lot / f/u per Dr [name] planned  rec See Dr [name] for all issues related to your cpap machine but it appears to be working fine by your download today  No change in medications and recs     08/08/2016  f/u ov/[name] re: multiple concerns 1) check thyroid - clinically no symptoms 2) feet stay cold/ blue with neg vascular w/u to date 3) wants referral to sports medicine as needs stay hyperextended due to MS/ no problem with falling just using cane Rec Please see patient coordinator before you leave today  to schedule referal to sports medicine and vascular surgery Please remember to go to the lab department downstairs in the basement  for your tests - we will call you with the results when they are available. Please schedule a follow up visit in 3 months but call sooner if needed  11/10/2016  f/u ov/Kayla [name] re: hyponatremia  Chief Complaint  Patient presents with  . Follow-up    recent visit to ED- 11/07/16 for hyponatremia- sodium level was 123.   Bladder dysfunction / foley dep since 11/04/16 > f/u [place] Knee hyperext better with support  Hyponatremia/ no symptoms on  Lasix daily  cpap d/c 'd 09/03/2016 with RLS not bothersome to her > neurology f/u  Asthma vs vcd    dulera 100 2bid / hfa with some sense of globus but no cough or increased sob/ noct symptoms or need for saba  rec Plan A = Automatic = dulera 100 2 pffs each am and then only take the pm the dose if you are not doing great and/or needing your rescue inhaler  Work on inhaler technique:      Plan B = Backup Only use your albuterol (ventolin) as a rescue medication   Drink gatorade if you need to push more fluids for any reason   08/11/2017  f/u ov/[name] re: f/u cough with report of new  r ankle pain./swelling  since early march 2019  Chief Complaint  Patient  presents with  . Follow-up    Cough has resolved. She is having some issues with pain and swelling in her right foot.    Dyspnea:  Limited by R ankle pain / sp R hip dislocation Cough: gone Sleep: L side or back hoziontally SABA use:  None while on dulera 100 x 2 each am  rec Refer to [place] did not go     11/24/2017  f/u ov/Kayla [name] re: asthma/ vcd from MS and chronic R ankle/foot pain  Chief Complaint  Patient presents with  . Follow-up    complaining of occ. cough w/ white mucus,feels like something is constantly in her throat  Dyspnea:  Not limited by breathing from desired activities  But by orthopedic restrictions = esp up and down steps R ankle Cough: mucus needs to clear more day than noct s am flare Sleeping: L side but flat SABA use: rare rec Please see patient coordinator before you leave today  to schedule [facility] evaluation for R ankle pain as a second opinion GERD diet    02/23/2018  f/u ov/Kayla [name] re: asthma/vcd    Chief Complaint  Patient presents with  . Follow-up    Breathing doing well and no new co's.She rarely uses her rescue inhaler.  Dyspnea:  Still limited by R ankle (80% tear in achilles/ f/u [facility], [facility]) Cough: none Sleeping: flat bed, L side one pillow SABA use: rarely  02: none     No obvious day to day or daytime variability or assoc excess/ purulent sputum or mucus plugs or hemoptysis or cp or chest tightness, subjective wheeze or overt sinus or hb symptoms.   Sleeping as above  without nocturnal  or early am exacerbation  of respiratory  c/o's or need for noct saba. Also denies any obvious fluctuation of symptoms with weather or environmental changes or other aggravating or alleviating factors except as outlined above   No unusual exposure hx or h/o childhood pna/ asthma or knowledge of premature birth.  Current Allergies, Complete Past Medical History, Past Surgical History, Family History, and Social History were reviewed in [employer] record.  ROS  The following are not active complaints unless bolded Hoarseness, sore throat, dysphagia, dental problems, itching, sneezing,  nasal congestion or discharge of excess mucus or purulent secretions, ear ache,   fever, chills, sweats,  unintended wt loss or wt gain, classically pleuritic or exertional cp,  orthopnea pnd or arm/hand swelling  or leg swelling, presyncope, palpitations, abdominal pain, anorexia, nausea, vomiting, diarrhea  or change in bowel habits or change in bladder habits, change in stools or change in urine, dysuria, hematuria,  rash, arthralgias, visual complaints, headache, numbness, weakness or ataxia or problems with walking or coordination,  change in mood or  memory.        Current Meds  Medication Sig  . amphetamine-dextroamphetamine (ADDERALL) 20 MG tablet Take 20 mg by mouth 2 (two) times daily.   . baclofen (LIORESAL) 20 MG tablet Take 20 mg by mouth 3 (three) times daily.   . Calcium Carbonate-Vitamin D (CALCIUM 600+D) 600-400 MG-UNIT tablet Take 1 tablet by mouth daily.  . cholecalciferol (VITAMIN D) 1000 UNITS tablet Take 2,000 Units by mouth daily.   Marland Kitchen DEXILANT 60 MG capsule TAKE 1 CAPSULE ONCE DAILY WITH BREAKFAST.  . DULERA 100-5 MCG/ACT AERO INHALE 2 PUFFS IN THE AM AND 2 PUFFS IN THE PM ABOUT 12 HOURS APART.  Marland Kitchen FLUoxetine (PROZAC) 40 MG capsule Take 40 mg by mouth daily.    . furosemide (LASIX) 20 MG tablet Take 1 tablet (20 mg total) by mouth daily.  Marland Kitchen gabapentin (NEURONTIN) 600 MG tablet Take 600 mg by mouth 2 (two) times daily.  Marland Kitchen levothyroxine (SYNTHROID, LEVOTHROID) 100 MCG tablet TAKE 1 TABLET ONCE DAILY BEFORE BREAKFAST.  . Multiple Vitamin (MULTIVITAMIN WITH MINERALS) TABS tablet Take 1 tablet by mouth daily.  . naproxen sodium (ALEVE) 220 MG tablet Take 220 mg by mouth 2 (two) times daily with a meal.  . ocrelizumab (OCREVUS) 300 MG/10ML injection Inject 300 mg into the vein every 6 (six) months.   . Respiratory  Therapy Supplies (FLUTTER) DEVI Use as directed  . temazepam (RESTORIL) 15 MG capsule Take 15 mg by mouth at bedtime.   . VENTOLIN HFA 108 (90 Base) MCG/ACT inhaler INHALE 2 PUFFS INTO LUNGS EVERY 6 HOURS AS NEEDED.          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  [place]  Bladder dysfunction ...............................................Marland Kitchen  [facility]  Dyspnea/wheeze ? asthma  - HFA 50 % after coaching October 04, 2009 >  50% 06/25/2010  - PFT's October 04, 2009 FEV1 2.15 (95%) with ratio 69 and DLCO 58> corrects to 98, mild insp truncation  Health Maintenance.............................................Marland KitchenWert  - Pneumovax March 28, 2009 and Prevnar 13 11/06/2015  - Td  April 10, 2009  - CPX 11/06/2015        Objective:   Physical Exam  amb wf using 2 wheeled walker / obvious insp stidor with FIVC maneuver but none otherwise  Vital signs reviewed - Note on arrival 02 sats  98% on RA    Wt  118 > 127 March 28, 2009 >129 June 17 > 128 October 04, 2009 > 129 06/25/2010 >125 10/29/2010 > 128 11/26/2010 >>127 12/10/2010 > 11/17/2011  129> 126 05/20/2012 > 07/29/2012  129 > 10/20/2012  129 >  135 06/21/2013 >  11/04/2013 135 >134 04/04/2014 > 04/18/2014 135 >  07/17/2014 132> 08/17/2014  135 > 12/20/2014 > 01/17/2015 134   > 08/08/2015  126 >  10/01/2015   130 > 11/06/2015   130 > 11/10/2016  123 > 02/10/2017  133  > 05/14/2017   129 > 08/11/2017  125 > 11/24/2017  127 >  02/23/2018  127    HEENT: nl dentition, turbinates bilaterally, and oropharynx. Nl external ear canals without cough reflex   NECK :  without  JVD/Nodes/TM/ nl carotid upstrokes bilaterally   LUNGS: no acc muscle use,  Nl contour chest which is clear to A and P bilaterally without cough on insp or exp maneuvers   CV:  RRR  no s3 or murmur or increase in P2, and no edema   ABD:  soft and nontender with nl inspiratory excursion in the supine position. No bruits or organomegaly appreciated, bowel sounds nl  MS:   ext warm without deformities, calf tenderness, cyanosis or clubbing No obvious joint restrictions   SKIN: warm and dry without lesions  With chronic purplish discoloration both lower ext no change from priors  NEURO:  alert, approp, nl sensorium with  no motor or cerebellar deficits apparent.           Assessment & Plan:

## 2018-02-24 ENCOUNTER — Encounter: Payer: Self-pay | Admitting: Internal Medicine

## 2018-02-24 DIAGNOSIS — E039 Hypothyroidism, unspecified: Secondary | ICD-10-CM | POA: Diagnosis not present

## 2018-02-24 DIAGNOSIS — K219 Gastro-esophageal reflux disease without esophagitis: Secondary | ICD-10-CM | POA: Diagnosis not present

## 2018-02-24 DIAGNOSIS — G35 Multiple sclerosis: Secondary | ICD-10-CM | POA: Diagnosis not present

## 2018-02-24 DIAGNOSIS — D72819 Decreased white blood cell count, unspecified: Secondary | ICD-10-CM | POA: Diagnosis not present

## 2018-02-24 DIAGNOSIS — F988 Other specified behavioral and emotional disorders with onset usually occurring in childhood and adolescence: Secondary | ICD-10-CM | POA: Diagnosis not present

## 2018-02-24 DIAGNOSIS — N319 Neuromuscular dysfunction of bladder, unspecified: Secondary | ICD-10-CM | POA: Diagnosis not present

## 2018-02-24 NOTE — Assessment & Plan Note (Signed)
-  11/10/2016 rec try just taking 2 pffs each am since having sense of globus on 2bid with baseline poor technique - flutter valve rec 05/14/2017  - 02/23/2018  After extensive coaching inhaler device,  effectiveness =    75% (short Ti)   All goals of chronic asthma control met including optimal function and elimination of symptoms with minimal need for rescue therapy.  Contingencies discussed in full including contacting this office immediately if not controlling the symptoms using the rule of two's.

## 2018-02-24 NOTE — Assessment & Plan Note (Signed)
TSH due but having blood work soon in [place] which is fine with me, will send results   I had an extended discussion with the patient reviewing all relevant studies completed to date and  lasting 10 minutes of a 15 minute visit    See device teaching which extended face to face time for this visit   Each maintenance medication was reviewed in detail including most importantly the difference between maintenance and prns and under what circumstances the prns are to be triggered using an action plan format that is not reflected in the computer generated alphabetically organized AVS.     Please see AVS for specific instructions unique to this visit that I personally wrote and verbalized to the the pt in detail and then reviewed with pt  by my nurse highlighting any  changes in therapy recommended at today's visit to their plan of care.

## 2018-02-25 ENCOUNTER — Ambulatory Visit: Payer: Medicare Other | Attending: Orthopedic Surgery

## 2018-02-25 ENCOUNTER — Telehealth: Payer: Self-pay

## 2018-02-25 ENCOUNTER — Other Ambulatory Visit: Payer: Self-pay

## 2018-02-25 DIAGNOSIS — R2689 Other abnormalities of gait and mobility: Secondary | ICD-10-CM | POA: Diagnosis not present

## 2018-02-25 DIAGNOSIS — M6281 Muscle weakness (generalized): Secondary | ICD-10-CM | POA: Insufficient documentation

## 2018-02-25 DIAGNOSIS — R278 Other lack of coordination: Secondary | ICD-10-CM | POA: Diagnosis not present

## 2018-02-25 NOTE — Telephone Encounter (Signed)
Send this message  That's fine but suspect we need a specific dx and if it's orthopedic then then orthopedist needs to direct rx - if it's relate to weakness then her neurologist  I'm fine signing a filled out form but not re-inventing the wheel to fit insurance criteria for justification for PT  Dr Melvyn Novas

## 2018-02-25 NOTE — Therapy (Signed)
[facility] [address] [place], [place], [address] Phone: [phone]   Fax:  [phone]  Physical Therapy Evaluation  Patient Details  Name: Kayla [name] MRN: [id number] Date of Birth: Aug 17, 1950 Referring Provider (PT): Dr. Almedia Balls   Encounter Date: 02/25/2018  PT End of Session - 02/25/18 1347    Visit Number  1    Number of Visits  17    Date for PT Re-Evaluation  04/26/18    Authorization Type  Medicare primary and BCBS secondary    PT Start Time  1109    PT Stop Time  1159    PT Time Calculation (min)  50 min    Equipment Utilized During Treatment  --   min A to S prn   Activity Tolerance  Patient tolerated treatment well    Behavior During Therapy  [facility] for tasks assessed/performed       Past Medical History:  Diagnosis Date  . Arthritis    oa  . Cervical cancer ([place]) 2005  . COPD (chronic obstructive pulmonary disease) ([place])   . Multiple sclerosis ([place])   . Osteoporosis   . Other diseases of vocal cords    vocal cords are not even  . Other voice and resonance disorders   . Pneumonia 2014, 1992  . Sleep apnea    no cpap used  . Unspecified hypothyroidism     Past Surgical History:  Procedure Laterality Date  . ABDOMINAL HYSTERECTOMY  2005   for cervical cancer, complete  . CONVERSION TO TOTAL HIP Right 09/18/2015   Procedure: CONVERSION OF RIGHT HEMI ARTHROPLASTY TO TOTAL HIP ARTHROPLASTY ACETABULAR REVISON ;  Surgeon: Paralee Cancel, MD;  Location: [facility];  Service: Orthopedics;  Laterality: Right;  . EYE SURGERY Bilateral    lens for cataracts  . HIP ARTHROPLASTY Right 03/01/2013   Procedure: ARTHROPLASTY BIPOLAR HIP;  Surgeon: Mauri Pole, MD;  Location: [facility];  Service: Orthopedics;  Laterality: Right;  . HIP CLOSED REDUCTION Right 03/12/2013   Procedure: CLOSED REDUCTION HIP;  Surgeon: Gearlean Alf, MD;  Location: [facility];  Service: Orthopedics;  Laterality: Right;  . HIP CLOSED  REDUCTION Right 03/19/2013   Procedure: CLOSED REDUCTION HIP;  Surgeon: Johnn Hai, MD;  Location: [facility];  Service: Orthopedics;  Laterality: Right;  . HIP CLOSED REDUCTION Right 11/10/2015   Procedure: CLOSED MANIPULATION HIP;  Surgeon: Rod Can, MD;  Location: [facility];  Service: Orthopedics;  Laterality: Right;  . HIP CLOSED REDUCTION Right 06/05/2017   Procedure: CLOSED REDUCTION HIP;  Surgeon: [name], MD;  Location: [facility];  Service: Orthopedics;  Laterality: Right;  . TOTAL HIP REVISION Right 06/06/2017   Procedure: TOTAL HIP REVISION;  Surgeon: [name], MD;  Location: [facility];  Service: Orthopedics;  Laterality: Right;  Marland Kitchen VESICOVAGINAL FISTULA CLOSURE W/ TAH  2005    There were no vitals filed for this visit.   Subjective Assessment - 02/25/18 1334    Subjective  Pt presenting s/p R Achilles tear (89%) which was treated with casting and boot this summer/fall. [facility] office staff Janace Hoard) spoke with Dr. Lynann Bologna and MD stated pt does not have restrictions or weight bearing precautions. Pt reported she ceased wearing boot/cast 4 weeks ago. Pt with hx of MS and gait/balance issues which have worsened s/p R hip revision. Pt reported R side (UE/LE) has hx of hip/LE fractures and R shoulder fx. R ankle pain begain in 05/2017 and pt had to  start using RW vs. SPC. Hx of detached retina, which caused fall out of chair and R shoulder fx (no surgery). Pt reports she continues to have R shoulder issues which she believes is due to using RW. Pt has difficulty reaching overhead, performing shoulder ER and fastening bra. Pt reported LLE is longer than RLE. Pt denied falls or the last 6 months.     Pertinent History  MS, hypothyroidism, sleep apnea (no longer using CPAP per last sleep study), bladder dysfunction, COPD, asthma, arthritis, osteoporsis    Patient Stated Goals  Walk with my cane again and get R side working again    Currently in Pain?  No/denies         [facility] PT Assessment  - 02/25/18 1147      Assessment   Medical Diagnosis  R Achilles tear, MS    Referring Provider (PT)  Dr. Almedia Balls    Onset Date/Surgical Date  05/22/17   pain began at this time   Hand Dominance  Right    Next MD Visit  03/2018 with Dr. Lynann Bologna      Precautions   Precautions  Fall;Posterior Hip    Precaution Comments  No ankle precautions per Dr. Lynann Bologna. Pt stated she's on "lifetime" posterior R hip precautions.       Restrictions   Weight Bearing Restrictions  No      Balance Screen   Has the patient fallen in the past 6 months  No    Has the patient had a decrease in activity level because of a fear of falling?   Yes    Is the patient reluctant to leave their home because of a fear of falling?   No      Home Environment   Living Environment  Private residence    Living Arrangements  Spouse/significant other    Available Help at Discharge  Available PRN/intermittently    Type of [place] to enter    Entrance Stairs-Number of Steps  4-5 step    Entrance Stairs-Rails  Can reach both    Home Layout  Two level;Able to live on main level with bedroom/bathroom   with master bed/bath upstairs   Alternate Level Stairs-Number of Steps  12    Alternate Level Stairs-Rails  Can reach both    [place] - 2 wheels      Prior Function   Level of Independence  Independent with community mobility with device    Vocation  Part time employment    Vocation Requirements  Typing, seated    Leisure  Play with grand daughter      Cognition   Overall Cognitive Status  Within Functional Limits for tasks assessed      Sensation   Light Touch  Appears Intact    Additional Comments  Pt reported intermittent N/T in hands and feet.       Coordination   Gross Motor Movements are Fluid and Coordinated  No    Fine Motor Movements are Fluid and Coordinated  No      Posture/Postural Control   Posture/Postural Control  Postural limitations    Postural  Limitations  Flexed trunk;Increased lumbar lordosis      Tone   Assessment Location  Left Lower Extremity;Right Lower Extremity      ROM / Strength   AROM / PROM / Strength  AROM;Strength      AROM   Overall AROM  Deficits    Overall AROM Comments  LUE WFL, RUE limited in shoulder flexion/IR/ER. R dorsiflexion limited to approx. neutral, R hip ER also likley limited 2/2 pt's noted to experience incr. R hip IR during all movements.       Strength   Overall Strength  Deficits    Overall Strength Comments  RLE: hip flex: 2/5, knee ext: 3/5, knee flex: 3+/5, ankle DF: 2/5. LLE: hip flex: 4/5, knee ext: 4/5, knee flex: 3+/5, ankle DF: 4/5. Gross B hip abd/add: 3/5       Transfers   Transfers  Sit to Stand;Stand to [employer]    Sit to Stand  5: Supervision;With upper extremity assist;From chair/3-in-1    Stand to Sit  5: Supervision;With upper extremity assist;To chair/3-in-1    Stand Pivot Transfers  5: Supervision;With armrests      Ambulation/Gait   Ambulation/Gait  Yes    Ambulation/Gait Assistance  5: Supervision    Ambulation/Gait Assistance Details  Amb. with RW and S for safety.    Ambulation Distance (Feet)  100 Feet    Assistive device  Rolling walker    Gait Pattern  Step-through pattern;Decreased stride length;Decreased dorsiflexion - left;Decreased dorsiflexion - right;Right genu recurvatum;Left genu recurvatum;Trunk flexed;Narrow base of support    Ambulation Surface  Level;Indoor    Gait velocity  1.9ft/sec. with RW      Standardized Balance Assessment   Standardized Balance Assessment  Berg Balance Test;Timed Up and Go Test      Berg Balance Test   Sit to Stand  Able to stand  independently using hands    Standing Unsupported  Able to stand 30 seconds unsupported    Sitting with Back Unsupported but Feet Supported on Floor or Stool  Able to sit safely and securely 2 minutes    Stand to Sit  Controls descent by using hands    Transfers  Able to  transfer safely, definite need of hands    Standing Unsupported with Eyes Closed  Able to stand 10 seconds with supervision    Standing Ubsupported with Feet Together  Able to place feet together independently but unable to hold for 30 seconds    From Standing, Reach Forward with Outstretched Arm  Can reach forward >12 cm safely (5")   RUE   From Standing Position, Pick up Object from Floor  Able to pick up shoe, needs supervision    From Standing Position, Turn to Look Behind Over each Shoulder  Turn sideways only but maintains balance    Turn 360 Degrees  Needs assistance while turning   turning to the R side   Standing Unsupported, Alternately Place Feet on Step/Stool  Needs assistance to keep from falling or unable to try    Standing Unsupported, One Foot in Front  Able to take small step independently and hold 30 seconds    Standing on One Leg  Unable to try or needs assist to prevent fall    Total Score  30    Berg comment:  30/56: high falls risk      Timed Up and Go Test   TUG  Normal TUG    Normal TUG (seconds)  20.8   with RW     RLE Tone   RLE Tone  Within Functional Limits   with pt reporting intermittent spasms     LLE Tone   LLE Tone  Within Functional Limits   with pt reporting intermittent spasms  Objective measurements completed on examination: See above findings.              PT Education - 02/25/18 1347    Education Details  PT discussed outcome measure results, PT POC, frequency, and duration.     Person(s) Educated  Patient    Methods  Explanation    Comprehension  Verbalized understanding       PT Short Term Goals - 02/25/18 1356      PT SHORT TERM GOAL #1   Title  Pt will be IND in HEP to improve deficits listed above. TARGET DATE FOR ALL STGS: 03/25/18    Status  New      PT SHORT TERM GOAL #2   Title  Pt will improve TUG time with LRAD to </=13.5 sec. to decr. falls risk.     Status  New      PT SHORT TERM GOAL  #3   Title  Pt will amb. 300' over even terrain, at MOD I level, with LRAD to improve functional mobility.     Status  New      PT SHORT TERM GOAL #4   Title  Pt will improve gait speed to >/=2.65ft/sec. with LRAD to decr. falls risk.     Status  New      PT SHORT TERM GOAL #5   Title  Pt will improve BERG score to >/=34/56 to decr. falls risk.     Status  New        PT Long Term Goals - 02/25/18 1358      PT LONG TERM GOAL #1   Title  Pt will improve gait speed with LRAD to >/=2.22ft/sec to safely amb. in the community. TARGET DATE FOR ALL LTGS: 04/22/18    Status  New      PT LONG TERM GOAL #2   Title  Pt will improve BERG score to >/=38/56 to decr. falls risk.     Status  New      PT LONG TERM GOAL #3   Title  Pt will amb. 500' over even/uneven terrain with LRAD at MOD I level to improve functional mobility.     Status  New      PT LONG TERM GOAL #4   Title  Pt will amb. 100' with SPC, at MOD I Level, over even terrain to safely use SPC at home.    Status  New             Plan - 02/25/18 1350    Clinical Impression Statement  Pt is a pleasant 67y/o female presenting to OPPT neuro s/p R Achilles tear (89%) with casting and boot last worn 4 weeks ago to treat. Pt with hx of MS and MD requesting pt's balance and gait impairments to be addressed-no precautions or restrictions for R ankle per MD. Pt's PMH significant for the following: MS, hypothyroidism, sleep apnea (no longer using CPAP per last sleep study), bladder dysfunction, COPD, asthma, arthritis, osteoporsis. Pt's gait speed, TUG time, and BERG score all indicate pt is at a high risk for falls. Pt noted to experience the following impairments during exam: gait deviations, decr. strength, impaired posture (flexed trunk with incr. Lx spine lordosis), intermittent impaired sensation, and impaired balance. Pt would benefit from skilled PT to improve safety during functional mobility.     History and Personal Factors  relevant to plan of care:  Pt works part-time (20 hours/week), pt likes to spend time with 14 month  old grand daughter but is limited 2/2 fatigue, decr. strength and balance, pt's husband works during the day.     Clinical Presentation  Evolving    Clinical Presentation due to:  MS, hypothyroidism, sleep apnea (no longer using CPAP per last sleep study), bladder dysfunction, COPD, asthma, arthritis, osteoporosis    Clinical Decision Making  Moderate    Rehab Potential  Good    Clinical Impairments Affecting Rehab Potential  see above    PT Frequency  2x / week    PT Duration  8 weeks    PT Treatment/Interventions  ADLs/Self Care Home Management;Aquatic Therapy;Biofeedback;Canalith Repostioning;Electrical Stimulation;Therapeutic exercise;Therapeutic activities;Manual techniques;Vestibular;Functional mobility training;Orthotic Fit/Training;Stair training;Gait training;Patient/family education;DME Instruction;Neuromuscular re-education    PT Next Visit Plan  Establish HEP.    Recommended Other Services  OT referral    Consulted and Agree with Plan of Care  Patient       Patient will benefit from skilled therapeutic intervention in order to improve the following deficits and impairments:  Abnormal gait, Decreased endurance, Impaired sensation, Decreased knowledge of use of DME, Decreased strength, Impaired tone, Impaired UE functional use, Decreased balance, Decreased mobility, Pain, Decreased range of motion, Postural dysfunction, Impaired flexibility, Decreased coordination(impaired tone per pt, none noted during exam, no pain during session-PT will not treat directly but will monitor closely)  Visit Diagnosis: Other abnormalities of gait and mobility - Plan: PT plan of care cert/re-cert  Muscle weakness (generalized) - Plan: PT plan of care cert/re-cert  Other lack of coordination - Plan: PT plan of care cert/re-cert     Problem List Patient Active Problem List   Diagnosis Date Noted  .  Ankle pain, right 08/11/2017  . Hypotension 06/07/2017  . Failed total hip arthroplasty with dislocation ([place]) 06/05/2017  . Cough variant asthma vs vcd  related to [facility]  11/10/2016  . Positional sleep apnea 09/10/2016  . Periodic limb movements of sleep 09/10/2016  . OSA on CPAP 02/08/2016  . S/P revision right [facility] 09/18/2015  . Leg swelling 08/21/2014  . Acute URI 04/04/2014  . Health care maintenance 12/16/2013  . Other and unspecified hyperlipidemia 11/04/2013  . 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 S/P hip hemiarthroplasty 03/15/2013  . Dislocation of hip prosthesis ([place]) 03/12/2013  . Hip dislocation, left ([place]) 03/12/2013  . Hip dislocation, right ([place]) 03/12/2013  . Urinary retention with incomplete bladder emptying 03/09/2013  . Dysphagia, pharyngoesophageal phase 03/09/2013  . Femoral neck fracture ([place]) 03/08/2013  . Unspecified constipation 02/26/2013  . Fractured femoral neck ([place]) 02/23/2013  . Hip fracture ([place]) 02/23/2013  . Excessive flatus 10/21/2012  . Cold feet 07/29/2012  . Hyponatremia 05/20/2012  . Calf pain 11/17/2011  . Dyspnea 12/06/2007  . Post-menopausal osteoporosis 09/17/2007  . Hypothyroidism 04/01/2007  . Multiple sclerosis ([place]) 04/01/2007  . Other diseases of vocal cords 04/01/2007    , L 02/25/2018, 3:49 PM  [facility] [address] [place], [place], [address] Phone: [phone]   Fax:  [phone]  Name: JENISA MONTY MRN: [id number] Date of Birth: 10-22-1950   Geoffry Paradise, PT,DPT 02/25/18 3:51 PM Phone: [phone] Fax: [phone]

## 2018-02-25 NOTE — Telephone Encounter (Signed)
Dr. Melvyn Novas  I saw Kayla [name] for a PT eval today. She reported issues with overhead reaching, fastening bra, and performing ADLs which require incr. R shoulder ER. I feel she would benefit from an OT eval. If you agree, please send an OT referral to our office.  Thank you, Geoffry Paradise, PT,DPT 02/25/18 4:08 PM Phone: [phone] Fax: [phone]

## 2018-02-26 ENCOUNTER — Telehealth: Payer: Self-pay | Admitting: Internal Medicine

## 2018-02-26 DIAGNOSIS — M9903 Segmental and somatic dysfunction of lumbar region: Secondary | ICD-10-CM | POA: Diagnosis not present

## 2018-02-26 DIAGNOSIS — M50322 Other cervical disc degeneration at C5-C6 level: Secondary | ICD-10-CM | POA: Diagnosis not present

## 2018-02-26 DIAGNOSIS — M9901 Segmental and somatic dysfunction of cervical region: Secondary | ICD-10-CM | POA: Diagnosis not present

## 2018-02-26 DIAGNOSIS — M40292 Other kyphosis, cervical region: Secondary | ICD-10-CM | POA: Diagnosis not present

## 2018-02-26 MED ORDER — MOMETASONE FURO-FORMOTEROL FUM 100-5 MCG/ACT IN AERO: 2.0000 | INHALATION_SPRAY | Freq: Two times a day (BID) | RESPIRATORY_TRACT | 5 refills | Status: DC

## 2018-02-26 NOTE — Telephone Encounter (Signed)
Left message informing patient the refill of Kayla [name] has been sent to [facility]. Nothing further is needed at this time.

## 2018-03-01 ENCOUNTER — Encounter: Payer: Self-pay | Admitting: Physical Therapy

## 2018-03-01 ENCOUNTER — Ambulatory Visit: Payer: Medicare Other | Admitting: Physical Therapy

## 2018-03-01 DIAGNOSIS — M6281 Muscle weakness (generalized): Secondary | ICD-10-CM

## 2018-03-01 DIAGNOSIS — R2689 Other abnormalities of gait and mobility: Secondary | ICD-10-CM

## 2018-03-01 DIAGNOSIS — R278 Other lack of coordination: Secondary | ICD-10-CM

## 2018-03-01 NOTE — Patient Instructions (Signed)
Exercises  Sit to Stand - 10 reps - 2 sets - 1x daily - 5x weekly  Standing March with Counter Support - 10 reps - 3 sets - 1x daily - 5x weekly  Heel rises with counter support - 10 reps - 3 sets - 1x daily - 5x weekly  Standing Hip Abduction with Counter Support - 10 reps - 2 sets - 1x daily - 5x weekly  Romberg Stance with Head Nods - 10 reps - 3 sets - 1x daily - 5x weekly  [place]berg Stance with Eyes Closed - 10 reps - 3 sets - 20 hold - 1x daily - 5x weekly  Side Stepping with Counter Support - 3 reps - 1 sets - 1x daily - 5x weekly

## 2018-03-01 NOTE — Therapy (Signed)
[facility] [address] [place], [place], [address] Phone: [phone]   Fax:  [phone]  Physical Therapy Treatment  Patient Details  Name: Kayla [name] MRN: [id number] Date of Birth: [birth year] Referring Provider (PT): Dr. Almedia Balls   Encounter Date: 03/01/2018  PT End of Session - 03/01/18 1318    Visit Number  2    Number of Visits  17    Date for PT Re-Evaluation  04/26/18    Authorization Type  Medicare primary and BCBS secondary    PT Start Time  1110   pt in bathroom prior to start of PT   PT Stop Time  1149    PT Time Calculation (min)  39 min    Equipment Utilized During Treatment  Gait belt   min A to S prn   Activity Tolerance  Patient tolerated treatment well    Behavior During Therapy  [facility] for tasks assessed/performed       Past Medical History:  Diagnosis Date  . Arthritis    oa  . Cervical cancer ([place]) 2005  . COPD (chronic obstructive pulmonary disease) ([place])   . Multiple sclerosis ([place])   . Osteoporosis   . Other diseases of vocal cords    vocal cords are not even  . Other voice and resonance disorders   . Pneumonia 2014, 1992  . Sleep apnea    no cpap used  . Unspecified hypothyroidism     Past Surgical History:  Procedure Laterality Date  . ABDOMINAL HYSTERECTOMY  2005   for cervical cancer, complete  . CONVERSION TO TOTAL HIP Right 09/18/2015   Procedure: CONVERSION OF RIGHT HEMI ARTHROPLASTY TO TOTAL HIP ARTHROPLASTY ACETABULAR REVISON ;  Surgeon: Paralee Cancel, MD;  Location: [facility];  Service: Orthopedics;  Laterality: Right;  . EYE SURGERY Bilateral    lens for cataracts  . HIP ARTHROPLASTY Right 03/01/2013   Procedure: ARTHROPLASTY BIPOLAR HIP;  Surgeon: Mauri Pole, MD;  [facility];  Service: Orthopedics;  Laterality: Right;  . HIP CLOSED REDUCTION Right 03/12/2013   Procedure: CLOSED REDUCTION HIP;  Surgeon: Gearlean Alf, MD;  Location: [facility];  Service:  Orthopedics;  Laterality: Right;  . HIP CLOSED REDUCTION Right 03/19/2013   Procedure: CLOSED REDUCTION HIP;  Surgeon: Johnn Hai, MD;  Location: [facility];  Service: Orthopedics;  Laterality: Right;  . HIP CLOSED REDUCTION Right 11/10/2015   Procedure: CLOSED MANIPULATION HIP;  Surgeon: Rod Can, MD;  [facility];  Service: Orthopedics;  Laterality: Right;  . HIP CLOSED REDUCTION Right 06/05/2017   Procedure: CLOSED REDUCTION HIP;  Surgeon: [name], MD;  [facility];  Service: Orthopedics;  Laterality: Right;  . TOTAL HIP REVISION Right 06/06/2017   Procedure: TOTAL HIP REVISION;  Surgeon: [name], MD;  [facility];  Service: Orthopedics;  Laterality: Right;  [facility] VESICOVAGINAL FISTULA CLOSURE W/ TAH  2005    There were no vitals filed for this visit.  Subjective Assessment - 03/01/18 1112    Subjective  Feels like she overdid it this weekend from decorating her house and she feels tired today. States her ankle is still swollen but there is no pain. Shoulder feels weak from using RW.    Pertinent History  MS, hypothyroidism, sleep apnea (no longer using CPAP per last sleep study), bladder dysfunction, COPD, asthma, arthritis, osteoporsis    Patient Stated Goals  Walk with my cane again and get R side working again  Currently in Pain?  No/denies           [facility] Adult PT Treatment/Exercise - 03/01/18 1303      Transfers   Transfers  Sit to Stand;Stand to Sit    Sit to Stand  5: Supervision;4: Min guard    Sit to Stand Details (indicate cue type and reason)  with yoga block between knees to avoid excessive R hip abduction. Gait belt around thigh with instruction to push against belt and not let it fall. Pt stabilizes with BLE against mat table. Requires UE assist with fatigue.     Stand to Sit  5: Supervision;4: Min guard    Stand to Sit Details  Requires UE assist with fatigue.       High Level Balance   High Level Balance Comments  Corner balance  HEP. See pt instructions. pt required supervision to min guard with noted B knee instability.       Exercises   Exercises  Knee/Hip;Ankle    Other Exercises   Ankle and hip HEP for strengthening. See pt instructions. Verbal cues for proper exercise technique.              PT Education - 03/01/18 1147    Education Details  Initiated HEP for balance and ankle/hip strengthening    Person(s) Educated  Patient    Methods  Explanation;Demonstration;Verbal cues    Comprehension  Verbalized understanding;Returned demonstration;Verbal cues required       PT Short Term Goals - 02/25/18 1356      PT SHORT TERM GOAL #1   Title  Pt will be IND in HEP to improve deficits listed above. TARGET DATE FOR ALL STGS: 03/25/18    Status  New      PT SHORT TERM GOAL #2   Title  Pt will improve TUG time with LRAD to </=13.5 sec. to decr. falls risk.     Status  New      PT SHORT TERM GOAL #3   Title  Pt will amb. 300' over even terrain, at MOD I level, with LRAD to improve functional mobility.     Status  New      PT SHORT TERM GOAL #4   Title  Pt will improve gait speed to >/=2.75ft/sec. with LRAD to decr. falls risk.     Status  New      PT SHORT TERM GOAL #5   Title  Pt will improve BERG score to >/=34/56 to decr. falls risk.     Status  New        PT Long Term Goals - 02/25/18 1358      PT LONG TERM GOAL #1   Title  Pt will improve gait speed with LRAD to >/=2.79ft/sec to safely amb. in the community. TARGET DATE FOR ALL LTGS: 04/22/18    Status  New      PT LONG TERM GOAL #2   Title  Pt will improve BERG score to >/=38/56 to decr. falls risk.     Status  New      PT LONG TERM GOAL #3   Title  Pt will amb. 500' over even/uneven terrain with LRAD at MOD I level to improve functional mobility.     Status  New      PT LONG TERM GOAL #4   Title  Pt will amb. 100' with SPC, at MOD I Level, over even terrain to safely use SPC at home.    Status  New  Plan -  03/01/18 1319    Clinical Impression Statement  Today's treatment focused on initiating HEP for balance and ankle/hip strengthening. Pt was able to tolerate all strengthening exercises with verbal cues for proper technique and required up to min guard for balance activities. Pt would benefit from further PT to address strength, balance, and functional mobility deficits.     Rehab Potential  Good    Clinical Impairments Affecting Rehab Potential  see above    PT Frequency  2x / week    PT Duration  8 weeks    PT Treatment/Interventions  ADLs/Self Care Home Management;Aquatic Therapy;Biofeedback;Canalith Repostioning;Electrical Stimulation;Therapeutic exercise;Therapeutic activities;Manual techniques;Vestibular;Functional mobility training;Orthotic Fit/Training;Stair training;Gait training;Patient/family education;DME Instruction;Neuromuscular re-education    PT Next Visit Plan  Review HEP. Continue strengthing and balance.     PT Home Exercise Plan  [email or web address]    Consulted and Agree with Plan of Care  Patient       Patient will benefit from skilled therapeutic intervention in order to improve the following deficits and impairments:  Abnormal gait, Decreased endurance, Impaired sensation, Decreased knowledge of use of DME, Decreased strength, Impaired tone, Impaired UE functional use, Decreased balance, Decreased mobility, Pain, Decreased range of motion, Postural dysfunction, Impaired flexibility, Decreased coordination(impaired tone per pt, none noted during exam, no pain during session-PT will not treat directly but will monitor closely)  Visit Diagnosis: Other abnormalities of gait and mobility  Muscle weakness (generalized)  Other lack of coordination     Problem List Patient Active Problem List   Diagnosis Date Noted  . Ankle pain, right 08/11/2017  . Hypotension 06/07/2017  . Failed total hip arthroplasty with dislocation ([place]) 06/05/2017  . Cough variant asthma vs vcd  related to  [facility]  11/10/2016  . Positional sleep apnea 09/10/2016  . Periodic limb movements of sleep 09/10/2016  . OSA on CPAP 02/08/2016  . S/P revision right [facility] 09/18/2015  . Leg swelling 08/21/2014  . Acute URI 04/04/2014  . Health care maintenance 12/16/2013  . Other and unspecified hyperlipidemia 11/04/2013  . 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 S/P hip hemiarthroplasty 03/15/2013  . Dislocation of hip prosthesis ([place]) 03/12/2013  . Hip dislocation, left ([place]) 03/12/2013  . Hip dislocation, right ([place]) 03/12/2013  . Urinary retention with incomplete bladder emptying 03/09/2013  . Dysphagia, pharyngoesophageal phase 03/09/2013  . Femoral neck fracture ([place]) 03/08/2013  . Unspecified constipation 02/26/2013  . Fractured femoral neck ([place]) 02/23/2013  . Hip fracture ([place]) 02/23/2013  . Excessive flatus 10/21/2012  . Cold feet 07/29/2012  . Hyponatremia 05/20/2012  . Calf pain 11/17/2011  . Dyspnea 12/06/2007  . Post-menopausal osteoporosis 09/17/2007  . Hypothyroidism 04/01/2007  . Multiple sclerosis ([place]) 04/01/2007  . Other diseases of vocal cords 04/01/2007    Cecile Sheerer, SPTA 03/01/2018, 2:13 PM  [facility] [address] [place], [place], [address] Phone: [phone]   Fax:  [phone]  Name: Kayla [name] MRN: [id number] Date of Birth: 1950-04-12

## 2018-03-02 ENCOUNTER — Other Ambulatory Visit: Payer: Self-pay | Admitting: Internal Medicine

## 2018-03-02 DIAGNOSIS — M9903 Segmental and somatic dysfunction of lumbar region: Secondary | ICD-10-CM | POA: Diagnosis not present

## 2018-03-02 DIAGNOSIS — M9901 Segmental and somatic dysfunction of cervical region: Secondary | ICD-10-CM | POA: Diagnosis not present

## 2018-03-02 DIAGNOSIS — M40292 Other kyphosis, cervical region: Secondary | ICD-10-CM | POA: Diagnosis not present

## 2018-03-02 DIAGNOSIS — M50322 Other cervical disc degeneration at C5-C6 level: Secondary | ICD-10-CM | POA: Diagnosis not present

## 2018-03-04 DIAGNOSIS — M50322 Other cervical disc degeneration at C5-C6 level: Secondary | ICD-10-CM | POA: Diagnosis not present

## 2018-03-04 DIAGNOSIS — M40292 Other kyphosis, cervical region: Secondary | ICD-10-CM | POA: Diagnosis not present

## 2018-03-04 DIAGNOSIS — M9903 Segmental and somatic dysfunction of lumbar region: Secondary | ICD-10-CM | POA: Diagnosis not present

## 2018-03-04 DIAGNOSIS — M9901 Segmental and somatic dysfunction of cervical region: Secondary | ICD-10-CM | POA: Diagnosis not present

## 2018-03-05 ENCOUNTER — Ambulatory Visit: Payer: Medicare Other

## 2018-03-08 ENCOUNTER — Ambulatory Visit: Payer: Medicare Other | Admitting: Physical Therapy

## 2018-03-08 ENCOUNTER — Encounter: Payer: Self-pay | Admitting: Physical Therapy

## 2018-03-08 DIAGNOSIS — R2689 Other abnormalities of gait and mobility: Secondary | ICD-10-CM | POA: Diagnosis not present

## 2018-03-08 DIAGNOSIS — R278 Other lack of coordination: Secondary | ICD-10-CM

## 2018-03-08 DIAGNOSIS — M6281 Muscle weakness (generalized): Secondary | ICD-10-CM | POA: Diagnosis not present
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
## 2018-03-09 DIAGNOSIS — M9903 Segmental and somatic dysfunction of lumbar region: Secondary | ICD-10-CM | POA: Diagnosis not present

## 2018-03-09 DIAGNOSIS — M50322 Other cervical disc degeneration at C5-C6 level: Secondary | ICD-10-CM | POA: Diagnosis not present

## 2018-03-09 DIAGNOSIS — M9901 Segmental and somatic dysfunction of cervical region: Secondary | ICD-10-CM | POA: Diagnosis not present

## 2018-03-09 DIAGNOSIS — M40292 Other kyphosis, cervical region: Secondary | ICD-10-CM | POA: Diagnosis not present

## 2018-03-11 ENCOUNTER — Ambulatory Visit: Payer: Medicare Other | Admitting: Physical Therapy

## 2018-03-11 ENCOUNTER — Encounter: Payer: Self-pay | Admitting: Physical Therapy

## 2018-03-11 DIAGNOSIS — R2689 Other abnormalities of gait and mobility: Secondary | ICD-10-CM

## 2018-03-11 DIAGNOSIS — R278 Other lack of coordination: Secondary | ICD-10-CM

## 2018-03-11 DIAGNOSIS — M6281 Muscle weakness (generalized): Secondary | ICD-10-CM

## 2018-03-11 NOTE — Therapy (Signed)
[facility] [address] [place], [place], [address] Phone: [phone]   Fax:  [phone]  Physical Therapy Treatment  Patient Details  Name: Kayla [name] MRN: [id number] Date of Birth: 04/23/1950 Referring Provider (PT): Dr. Almedia Balls   Encounter Date: 03/11/2018  PT End of Session - 03/11/18 0851    Visit Number  4    Number of Visits  17    Date for PT Re-Evaluation  04/26/18    Authorization Type  Medicare primary and BCBS secondary    PT Start Time  0848    PT Stop Time  0930    PT Time Calculation (min)  42 min    Equipment Utilized During Treatment  Gait belt    Activity Tolerance  Patient tolerated treatment well    Behavior During Therapy  [facility] for tasks assessed/performed       Past Medical History:  Diagnosis Date  . Arthritis    oa  . Cervical cancer ([place]) 2005  . COPD (chronic obstructive pulmonary disease) ([place])   . Multiple sclerosis ([place])   . Osteoporosis   . Other diseases of vocal cords    vocal cords are not even  . Other voice and resonance disorders   . Pneumonia 2014, 1992  . Sleep apnea    no cpap used  . Unspecified hypothyroidism     Past Surgical History:  Procedure Laterality Date  . ABDOMINAL HYSTERECTOMY  2005   for cervical cancer, complete  . CONVERSION TO TOTAL HIP Right 09/18/2015   Procedure: CONVERSION OF RIGHT HEMI ARTHROPLASTY TO TOTAL HIP ARTHROPLASTY ACETABULAR REVISON ;  Surgeon: Paralee Cancel, MD;  [facility];  Service: Orthopedics;  Laterality: Right;  . EYE SURGERY Bilateral    lens for cataracts  . HIP ARTHROPLASTY Right 03/01/2013   Procedure: ARTHROPLASTY BIPOLAR HIP;  Surgeon: Mauri Pole, MD;  [facility];  Service: Orthopedics;  Laterality: Right;  . HIP CLOSED REDUCTION Right 03/12/2013   Procedure: CLOSED REDUCTION HIP;  Surgeon: Gearlean Alf, MD;  Location: [facility];  Service: Orthopedics;  Laterality: Right;  . HIP CLOSED REDUCTION  Right 03/19/2013   Procedure: CLOSED REDUCTION HIP;  Surgeon: Johnn Hai, MD;  Location: [facility];  Service: Orthopedics;  Laterality: Right;  . HIP CLOSED REDUCTION Right 11/10/2015   Procedure: CLOSED MANIPULATION HIP;  Surgeon: Rod Can, MD;  [facility];  Service: Orthopedics;  Laterality: Right;  . HIP CLOSED REDUCTION Right 06/05/2017   Procedure: CLOSED REDUCTION HIP;  Surgeon: [name], MD;  [facility];  Service: Orthopedics;  Laterality: Right;  . TOTAL HIP REVISION Right 06/06/2017   Procedure: TOTAL HIP REVISION;  Surgeon: [name], MD;  [facility];  Service: Orthopedics;  Laterality: Right;  [facility] VESICOVAGINAL FISTULA CLOSURE W/ TAH  2005    There were no vitals filed for this visit.  Subjective Assessment - 03/11/18 0850    Subjective  Having a better week this week. No falls or pain to report. The HEP is still challenging per pt report.     Pertinent History  MS, hypothyroidism, sleep apnea (no longer using CPAP per last sleep study), bladder dysfunction, COPD, asthma, arthritis, osteoporsis    Patient Stated Goals  Walk with my cane again and get R side working again    Currently in Pain?  No/denies           [facility] Adult PT Treatment/Exercise - 03/11/18 1740  Transfers   Transfers  Sit to Stand;Stand to Sit    Sit to Stand  5: Supervision;With upper extremity assist;From chair/3-in-1;From bed    Stand to Sit  5: Supervision;With upper extremity assist;To bed;To chair/3-in-1      Ambulation/Gait   Ambulation/Gait  Yes    Ambulation/Gait Assistance  5: Supervision;4: Min guard    Ambulation/Gait Assistance Details  into/out of gym with RW and no braces with slow gait speed, genurecurvatum noted on right >left LE. trialed posterior Ottobock brace to right LE with no improvement noted. Pt is in agreement to orthotic consult in session with [facility].    Ambulation Distance (Feet)  100 Feet   x2, 115 x1   Assistive device   Rolling walker    Gait Pattern  Step-through pattern;Decreased stride length;Decreased dorsiflexion - left;Decreased dorsiflexion - right;Right genu recurvatum;Left genu recurvatum;Trunk flexed;Narrow base of support    Ambulation Surface  Level;Indoor      Knee/Hip Exercises: Seated   Long Arc Quad  Strengthening;Both;1 set;10 reps;Weights;Limitations    Long Arc Quad Weight  2 lbs.    Hamstring Curl  Strengthening;Both;1 set;10 reps;Limitations    Hamstring Limitations  with red band resistance      Knee/Hip Exercises: Supine   Short Arc Quad Sets  AROM;Strengthening;Both;1 set;10 reps;Limitations    Short Arc Quad Sets Limitations  1# ankle weight on right, 2# weigth on left    Bridges  AROM;Strengthening;Both;10 reps;Limitations;2 sets    Bridges Limitations  assist needed to maintain knee position with cues to lift hips as high as able.     Straight Leg Raises  AROM;AAROM;Strengthening;Both;1 set;10 reps;Limitations    Straight Leg Raises Limitations  occasional assist for controlled movements, right > left LE's. cues/assist to maintain knee extension without hyperextension with ex's as well.     Other Supine Knee/Hip Exercises  hook lying with band around knees: hip falls outs x 10 reps each leg with assist needed to stabilize non moving legs. decreased motion noted on right LE vs left LE.                PT Short Term Goals - 02/25/18 1356      PT SHORT TERM GOAL #1   Title  Pt will be IND in HEP to improve deficits listed above. TARGET DATE FOR ALL STGS: 03/25/18    Status  New      PT SHORT TERM GOAL #2   Title  Pt will improve TUG time with LRAD to </=13.5 sec. to decr. falls risk.     Status  New      PT SHORT TERM GOAL #3   Title  Pt will amb. 300' over even terrain, at MOD I level, with LRAD to improve functional mobility.     Status  New      PT SHORT TERM GOAL #4   Title  Pt will improve gait speed to >/=2.11ft/sec. with LRAD to decr. falls risk.     Status   New      PT SHORT TERM GOAL #5   Title  Pt will improve BERG score to >/=34/56 to decr. falls risk.     Status  New        PT Long Term Goals - 02/25/18 1358      PT LONG TERM GOAL #1   Title  Pt will improve gait speed with LRAD to >/=2.42ft/sec to safely amb. in the community. TARGET DATE FOR ALL LTGS: 04/22/18  Status  New      PT LONG TERM GOAL #2   Title  Pt will improve BERG score to >/=38/56 to decr. falls risk.     Status  New      PT LONG TERM GOAL #3   Title  Pt will amb. 500' over even/uneven terrain with LRAD at MOD I level to improve functional mobility.     Status  New      PT LONG TERM GOAL #4   Title  Pt will amb. 100' with SPC, at MOD I Level, over even terrain to safely use SPC at home.    Status  New            Plan - 03/11/18 0851    Clinical Impression Statement  Today's skilled session continued to address LE strengthening with no issues reported. Also began to look at bracing options for right leg to assist with knee control in stance. Trialed the ottobock posterior brace with minimal change noted. Pt is agreeable to an orthotic consult with [facility] to look at bracing options as her current brace is over 20 years old. The pt is progressing toward goals and should benefit from continued PT to progress toward unmet goals.                                            Rehab Potential  Good    Clinical Impairments Affecting Rehab Potential  see above    PT Frequency  2x / week    PT Duration  8 weeks    PT Treatment/Interventions  ADLs/Self Care Home Management;Aquatic Therapy;Biofeedback;Canalith Repostioning;Electrical Stimulation;Therapeutic exercise;Therapeutic activities;Manual techniques;Vestibular;Functional mobility training;Orthotic Fit/Training;Stair training;Gait training;Patient/family education;DME Instruction;Neuromuscular re-education    PT Next Visit Plan  Needs LE strengthening, especially hip and quads!!! balance reactions training;  check for orthotic order from the MD, once recieved contact [facility] to set up consult with PT session    PT Home Exercise Plan  [email or web address]    Consulted and Agree with Plan of Care  Patient       Patient will benefit from skilled therapeutic intervention in order to improve the following deficits and impairments:  Abnormal gait, Decreased endurance, Impaired sensation, Decreased knowledge of use of DME, Decreased strength, Impaired tone, Impaired UE functional use, Decreased balance, Decreased mobility, Pain, Decreased range of motion, Postural dysfunction, Impaired flexibility, Decreased coordination(impaired tone per pt, none noted during exam, no pain during session-PT will not treat directly but will monitor closely)  Visit Diagnosis: Other abnormalities of gait and mobility  Muscle weakness (generalized)  Other lack of coordination     Problem List Patient Active Problem List   Diagnosis Date Noted  . Ankle pain, right 08/11/2017  . Hypotension 06/07/2017  . Failed total hip arthroplasty with dislocation ([place]) 06/05/2017  . Cough variant asthma vs vcd  related to [facility]  11/10/2016  . Positional sleep apnea 09/10/2016  . Periodic limb movements of sleep 09/10/2016  . OSA on CPAP 02/08/2016  . S/P revision right [facility] 09/18/2015  . Leg swelling 08/21/2014  . Acute URI 04/04/2014  . Health care maintenance 12/16/2013  . Other and unspecified hyperlipidemia 11/04/2013  . 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 S/P hip hemiarthroplasty 03/15/2013  . Dislocation of hip prosthesis ([place]) 03/12/2013  . Hip dislocation, left ([place]) 03/12/2013  . Hip dislocation, right ([place]) 03/12/2013  . Urinary retention  with incomplete bladder emptying 03/09/2013  . Dysphagia, pharyngoesophageal phase 03/09/2013  . Femoral neck fracture ([place]) 03/08/2013  . Unspecified constipation 02/26/2013  . Fractured femoral neck ([place]) 02/23/2013  . Hip fracture ([place]) 02/23/2013  . Excessive flatus 10/21/2012  . Cold feet  07/29/2012  . Hyponatremia 05/20/2012  . Calf pain 11/17/2011  . Dyspnea 12/06/2007  . Post-menopausal osteoporosis 09/17/2007  . Hypothyroidism 04/01/2007  . Multiple sclerosis ([place]) 04/01/2007  . Other diseases of vocal cords 04/01/2007    Willow Ora, PTA, [facility] [address], [place], [place] [address] [phone] 03/12/18, 11:31 AM   Name: Kayla [name] MRN: [id number] Date of Birth: 03-Nov-1950

## 2018-03-12 DIAGNOSIS — M9903 Segmental and somatic dysfunction of lumbar region: Secondary | ICD-10-CM | POA: Diagnosis not present

## 2018-03-12 DIAGNOSIS — M50322 Other cervical disc degeneration at C5-C6 level: Secondary | ICD-10-CM | POA: Diagnosis not present

## 2018-03-12 DIAGNOSIS — M40292 Other kyphosis, cervical region: Secondary | ICD-10-CM | POA: Diagnosis not present

## 2018-03-12 DIAGNOSIS — M9901 Segmental and somatic dysfunction of cervical region: Secondary | ICD-10-CM | POA: Diagnosis not present

## 2018-03-15 DIAGNOSIS — M50322 Other cervical disc degeneration at C5-C6 level: Secondary | ICD-10-CM | POA: Diagnosis not present

## 2018-03-15 DIAGNOSIS — M40292 Other kyphosis, cervical region: Secondary | ICD-10-CM | POA: Diagnosis not present

## 2018-03-15 DIAGNOSIS — M9903 Segmental and somatic dysfunction of lumbar region: Secondary | ICD-10-CM | POA: Diagnosis not present

## 2018-03-15 DIAGNOSIS — M9901 Segmental and somatic dysfunction of cervical region: Secondary | ICD-10-CM | POA: Diagnosis not present

## 2018-03-18 ENCOUNTER — Ambulatory Visit: Payer: Medicare Other

## 2018-03-18 ENCOUNTER — Telehealth: Payer: Self-pay

## 2018-03-18 DIAGNOSIS — M6281 Muscle weakness (generalized): Secondary | ICD-10-CM

## 2018-03-18 DIAGNOSIS — R2689 Other abnormalities of gait and mobility: Secondary | ICD-10-CM | POA: Diagnosis not present

## 2018-03-18 DIAGNOSIS — R278 Other lack of coordination: Secondary | ICD-10-CM | POA: Diagnosis not present

## 2018-03-18 NOTE — Therapy (Signed)
[facility] [address] [place], [place], [address] Phone: [phone]   Fax:  [phone]  Physical Therapy Treatment  Patient Details  Name: Kayla [name] MRN: [id number] Date of Birth: 1951-03-21 Referring Provider (PT): Dr. Almedia Balls   Encounter Date: 03/18/2018  PT End of Session - 03/18/18 1021    Visit Number  5    Number of Visits  17    Date for PT Re-Evaluation  04/26/18    Authorization Type  Medicare primary and BCBS secondary    PT Start Time  [phone]   pt late   PT Stop Time  1017    PT Time Calculation (min)  40 min    Equipment Utilized During Treatment  --   S prn   Activity Tolerance  Patient tolerated treatment well    Behavior During Therapy  [facility] for tasks assessed/performed       Past Medical History:  Diagnosis Date  . Arthritis    oa  . Cervical cancer ([place]) 2005  . COPD (chronic obstructive pulmonary disease) ([place])   . Multiple sclerosis ([place])   . Osteoporosis   . Other diseases of vocal cords    vocal cords are not even  . Other voice and resonance disorders   . Pneumonia 2014, 1992  . Sleep apnea    no cpap used  . Unspecified hypothyroidism     Past Surgical History:  Procedure Laterality Date  . ABDOMINAL HYSTERECTOMY  2005   for cervical cancer, complete  . CONVERSION TO TOTAL HIP Right 09/18/2015   Procedure: CONVERSION OF RIGHT HEMI ARTHROPLASTY TO TOTAL HIP ARTHROPLASTY ACETABULAR REVISON ;  Surgeon: Paralee Cancel, MD;  [facility];  Service: Orthopedics;  Laterality: Right;  . EYE SURGERY Bilateral    lens for cataracts  . HIP ARTHROPLASTY Right 03/01/2013   Procedure: ARTHROPLASTY BIPOLAR HIP;  Surgeon: Mauri Pole, MD;  [facility];  Service: Orthopedics;  Laterality: Right;  . HIP CLOSED REDUCTION Right 03/12/2013   Procedure: CLOSED REDUCTION HIP;  Surgeon: Gearlean Alf, MD;  Location: [facility];  Service: Orthopedics;  Laterality: Right;  . HIP  CLOSED REDUCTION Right 03/19/2013   Procedure: CLOSED REDUCTION HIP;  Surgeon: Johnn Hai, MD;  Location: [facility];  Service: Orthopedics;  Laterality: Right;  . HIP CLOSED REDUCTION Right 11/10/2015   Procedure: CLOSED MANIPULATION HIP;  Surgeon: Rod Can, MD;  [facility];  Service: Orthopedics;  Laterality: Right;  . HIP CLOSED REDUCTION Right 06/05/2017   Procedure: CLOSED REDUCTION HIP;  Surgeon: [name], MD;  [facility];  Service: Orthopedics;  Laterality: Right;  . TOTAL HIP REVISION Right 06/06/2017   Procedure: TOTAL HIP REVISION;  Surgeon: [name], MD;  [facility];  Service: Orthopedics;  Laterality: Right;  [facility] VESICOVAGINAL FISTULA CLOSURE W/ TAH  2005    There were no vitals filed for this visit.  Subjective Assessment - 03/18/18 0939    Subjective  Pt denied falls since last visit. Pt reported she's been busy with the holidays and her R ankle is very swollen today, she has not had a change to elevate or ice ankle. Pt brought in her old R posterior AFO but reports it is a size 8 and her foot size is 7-7.5.    Pertinent History  MS, hypothyroidism, sleep apnea (no longer using CPAP per last sleep study), bladder dysfunction, COPD, asthma, arthritis, osteoporsis    Patient Stated Goals  Walk with my cane again and get R side working again    Currently in Pain?  No/denies         Therex: Access Code: [email or web address]  URL: [email or web address]  Date: 03/18/2018  Prepared by: Geoffry Paradise   Exercises  Sit to Stand - 10 reps - 2 sets - 1x daily - 5x weekly  (REVIEWED) Standing March with Counter Support - 10 reps - 3 sets - 1x daily - 5x weekly (PERFORMED WITH B UE ON RW), cues to improve B LE adduction during marches. Heel rises with counter support - 10 reps - 3 sets - 1x daily - 5x weekly  (REVIEWED)  Romberg Stance with Head Nods - 10 reps - 3 sets - 1x daily - 5x weekly (REVIEWED) Romberg Stance with Eyes Closed - 10 reps -  3 sets - 20 hold - 1x daily - 5x weekly (REVIEWED) Clamshell - 10 reps - 3 sets - 1x daily - 3x weekly (PERFORMED) with cues and demo for technique. Side Stepping with Counter Support - 3 reps - 1 sets - 1x daily - 5x weekly (REVIEWED) Bent Knee Fallouts - 10 reps - 3 sets - 1x daily - 3x weekly (PERFORMED, RLE ONLY) Bridge - 10 reps - 3 sets - 1x daily - 3x weekly (PERFORMED WITH BLEs), however, RLE could not perform effectively 2/2 weakness, trialed bent knee fallout with yellow band instead).   Performed in standing, supine, and hooklying with cues and demo for technique.               [facility] Adult PT Treatment/Exercise - 03/18/18 0941      Ambulation/Gait   Ambulation/Gait  Yes    Ambulation/Gait Assistance  5: Supervision    Ambulation/Gait Assistance Details  Pt amb. with her posterior R AFO with cues to improve R toe clearance and B genu recurvatum.     Ambulation Distance (Feet)  130 Feet    Assistive device  Rolling walker    Gait Pattern  Step-through pattern;Decreased stride length;Decreased dorsiflexion - left;Decreased dorsiflexion - right;Right genu recurvatum;Left genu recurvatum;Trunk flexed;Narrow base of support    Ambulation Surface  Level;Indoor             PT Education - 03/18/18 1019    Education Details  PT modified/updated HEP per pt reported some difficulty with hip exercises. PT also discussed different bracing options, as current brace does not assist in decr. genu recurvatum. PT also educated pt on the importance of elevating and icing R ankle to reduce swelling.     Person(s) Educated  Patient    Methods  Explanation;Demonstration;Tactile cues;Verbal cues;Handout    Comprehension  Returned demonstration;Verbalized understanding       PT Short Term Goals - 02/25/18 1356      PT SHORT TERM GOAL #1   Title  Pt will be IND in HEP to improve deficits listed above. TARGET DATE FOR ALL STGS: 03/25/18    Status  New      PT SHORT TERM GOAL #2    Title  Pt will improve TUG time with LRAD to </=13.5 sec. to decr. falls risk.     Status  New      PT SHORT TERM GOAL #3   Title  Pt will amb. 300' over even terrain, at MOD I level, with LRAD to improve functional mobility.     Status  New      PT SHORT TERM GOAL #4   Title  Pt will  improve gait speed to >/=2.24ft/sec. with LRAD to decr. falls risk.     Status  New      PT SHORT TERM GOAL #5   Title  Pt will improve BERG score to >/=34/56 to decr. falls risk.     Status  New        PT Long Term Goals - 02/25/18 1358      PT LONG TERM GOAL #1   Title  Pt will improve gait speed with LRAD to >/=2.27ft/sec to safely amb. in the community. TARGET DATE FOR ALL LTGS: 04/22/18    Status  New      PT LONG TERM GOAL #2   Title  Pt will improve BERG score to >/=38/56 to decr. falls risk.     Status  New      PT LONG TERM GOAL #3   Title  Pt will amb. 500' over even/uneven terrain with LRAD at MOD I level to improve functional mobility.     Status  New      PT LONG TERM GOAL #4   Title  Pt will amb. 100' with SPC, at MOD I Level, over even terrain to safely use SPC at home.    Status  New            Plan - 03/18/18 8413    Clinical Impression Statement  Pt's gait with pt's R posterior AFO and pt continues to experience significant B genu recurvatum, R foot IR, and intermittent difficulty with clearing R toes. Pt would benefit from consult with orthotist to improve deviations, PT will send ordre to MD. PT then spent the rest of skilled session modifying and adding to HEP, as pt reported difficulty performing standing hip abduction and marches. Continue with POC.     Rehab Potential  Good    Clinical Impairments Affecting Rehab Potential  see above    PT Frequency  2x / week    PT Duration  8 weeks    PT Treatment/Interventions  ADLs/Self Care Home Management;Aquatic Therapy;Biofeedback;Canalith Repostioning;Electrical Stimulation;Therapeutic exercise;Therapeutic  activities;Manual techniques;Vestibular;Functional mobility training;Orthotic Fit/Training;Stair training;Gait training;Patient/family education;DME Instruction;Neuromuscular re-education    PT Next Visit Plan  Needs LE strengthening, especially hip and quads!!! balance reactions training; check for orthotic order from the MD, once recieved contact [facility] to set up consult with PT session    PT Home Exercise Plan  [email or web address]    Consulted and Agree with Plan of Care  Patient       Patient will benefit from skilled therapeutic intervention in order to improve the following deficits and impairments:  Abnormal gait, Decreased endurance, Impaired sensation, Decreased knowledge of use of DME, Decreased strength, Impaired tone, Impaired UE functional use, Decreased balance, Decreased mobility, Pain, Decreased range of motion, Postural dysfunction, Impaired flexibility, Decreased coordination(impaired tone per pt, none noted during exam, no pain during session-PT will not treat directly but will monitor closely)  Visit Diagnosis: Muscle weakness (generalized)  Other abnormalities of gait and mobility  Other lack of coordination     Problem List Patient Active Problem List   Diagnosis Date Noted  . Ankle pain, right 08/11/2017  . Hypotension 06/07/2017  . Failed total hip arthroplasty with dislocation ([place]) 06/05/2017  . Cough variant asthma vs vcd  related to [facility]  11/10/2016  . Positional sleep apnea 09/10/2016  . Periodic limb movements of sleep 09/10/2016  . OSA on CPAP 02/08/2016  . S/P revision right [facility] 09/18/2015  . Leg swelling 08/21/2014  . Acute  URI 04/04/2014  . Health care maintenance 12/16/2013  . Other and unspecified hyperlipidemia 11/04/2013  . 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 S/P hip hemiarthroplasty 03/15/2013  . Dislocation of hip prosthesis ([place]) 03/12/2013  . Hip dislocation, left ([place]) 03/12/2013  . Hip dislocation, right ([place]) 03/12/2013  . Urinary retention with  incomplete bladder emptying 03/09/2013  . Dysphagia, pharyngoesophageal phase 03/09/2013  . Femoral neck fracture ([place]) 03/08/2013  . Unspecified constipation 02/26/2013  . Fractured femoral neck ([place]) 02/23/2013  . Hip fracture ([place]) 02/23/2013  . Excessive flatus 10/21/2012  . Cold feet 07/29/2012  . Hyponatremia 05/20/2012  . Calf pain 11/17/2011  . Dyspnea 12/06/2007  . Post-menopausal osteoporosis 09/17/2007  . Hypothyroidism 04/01/2007  . Multiple sclerosis ([place]) 04/01/2007  . Other diseases of vocal cords 04/01/2007    Isaac Lacson L 03/18/2018, 12:41 PM  [facility] [address] [place], [place], [address] Phone: [phone]   Fax:  [phone]  Name: Kayla [name] MRN: [id number] Date of Birth: [birth year]  Geoffry Paradise, PT,DPT 03/18/18 12:44 PM Phone: [phone] Fax: [phone]

## 2018-03-18 NOTE — Patient Instructions (Signed)
Access Code: [email or web address]  URL: [email or web address]  Date: 03/18/2018  Prepared by: Geoffry Paradise   Exercises  Sit to Stand - 10 reps - 2 sets - 1x daily - 5x weekly  Standing March with Counter Support - 10 reps - 3 sets - 1x daily - 5x weekly  Heel rises with counter support - 10 reps - 3 sets - 1x daily - 5x weekly  Romberg Stance with Head Nods - 10 reps - 3 sets - 1x daily - 5x weekly  Romberg Stance with Eyes Closed - 10 reps - 3 sets - 20 hold - 1x daily - 5x weekly  Clamshell - 10 reps - 3 sets - 1x daily - 3x weekly  Side Stepping with Counter Support - 3 reps - 1 sets - 1x daily - 5x weekly  Bent Knee Fallouts - 10 reps - 3 sets - 1x daily - 3x weekly  Bridge - 10 reps - 3 sets - 1x daily - 3x weekly

## 2018-03-18 NOTE — Telephone Encounter (Signed)
Dr. Lynann Bologna  Kayla [name] continues to experience incr. B genu recurvatum during the stance phase of gait and difficulty with toe clearance. Her current AFO does not address these gait deviations. I feel she would benefit from a new brace, if you agree please send [place] an order.  Additionally, Kayla [name] reports difficulty performing ADLs (dressing, reaching overhead) 2/2 UE weakness. If you feel she would benefit from an OT eval, please send [place] an order.  Thank you, Geoffry Paradise, PT,DPT 03/18/18 12:48 PM Phone: [phone] Fax: [phone]

## 2018-03-23 ENCOUNTER — Ambulatory Visit: Payer: Medicare Other

## 2018-03-25 ENCOUNTER — Ambulatory Visit: Payer: Medicare Other | Attending: Orthopedic Surgery

## 2018-03-25 DIAGNOSIS — R2681 Unsteadiness on feet: Secondary | ICD-10-CM | POA: Insufficient documentation

## 2018-03-25 DIAGNOSIS — R2689 Other abnormalities of gait and mobility: Secondary | ICD-10-CM | POA: Diagnosis not present

## 2018-03-25 DIAGNOSIS — R293 Abnormal posture: Secondary | ICD-10-CM | POA: Diagnosis not present

## 2018-03-25 DIAGNOSIS — M6281 Muscle weakness (generalized): Secondary | ICD-10-CM | POA: Diagnosis not present

## 2018-03-25 DIAGNOSIS — R278 Other lack of coordination: Secondary | ICD-10-CM | POA: Diagnosis not present

## 2018-03-25 NOTE — Therapy (Signed)
[facility] [address] [place], [place], [address] Phone: [phone]   Fax:  [phone]  Physical Therapy Treatment  Patient Details  Name: Kayla [name] MRN: [id number] Date of Birth: [birth year] Referring Provider (PT): Dr. Almedia Balls   Encounter Date: 03/25/2018  PT End of Session - 03/25/18 1054    Visit Number  6    Number of Visits  17    Date for PT Re-Evaluation  04/26/18    Authorization Type  Medicare primary and BCBS secondary    PT Start Time  [phone]   pt late   PT Stop Time  0929    PT Time Calculation (min)  38 min    Equipment Utilized During Treatment  Gait belt    Activity Tolerance  Patient tolerated treatment well    Behavior During Therapy  [facility] for tasks assessed/performed       Past Medical History:  Diagnosis Date  . Arthritis    oa  . Cervical cancer ([place]) 2005  . COPD (chronic obstructive pulmonary disease) ([place])   . Multiple sclerosis ([place])   . Osteoporosis   . Other diseases of vocal cords    vocal cords are not even  . Other voice and resonance disorders   . Pneumonia 2014, 1992  . Sleep apnea    no cpap used  . Unspecified hypothyroidism     Past Surgical History:  Procedure Laterality Date  . ABDOMINAL HYSTERECTOMY  2005   for cervical cancer, complete  . CONVERSION TO TOTAL HIP Right 09/18/2015   Procedure: CONVERSION OF RIGHT HEMI ARTHROPLASTY TO TOTAL HIP ARTHROPLASTY ACETABULAR REVISON ;  Surgeon: Paralee Cancel, MD;  Location: [facility];  Service: Orthopedics;  Laterality: Right;  . EYE SURGERY Bilateral    lens for cataracts  . HIP ARTHROPLASTY Right 03/01/2013   Procedure: ARTHROPLASTY BIPOLAR HIP;  Surgeon: Mauri Pole, MD;  Location: [facility];  Service: Orthopedics;  Laterality: Right;  . HIP CLOSED REDUCTION Right 03/12/2013   Procedure: CLOSED REDUCTION HIP;  Surgeon: Gearlean Alf, MD;  Location: [facility];  Service: Orthopedics;  Laterality: Right;  . HIP CLOSED  REDUCTION Right 03/19/2013   Procedure: CLOSED REDUCTION HIP;  Surgeon: Johnn Hai, MD;  Location: [facility];  Service: Orthopedics;  Laterality: Right;  . HIP CLOSED REDUCTION Right 11/10/2015   Procedure: CLOSED MANIPULATION HIP;  Surgeon: Rod Can, MD;  Location: [facility];  Service: Orthopedics;  Laterality: Right;  . HIP CLOSED REDUCTION Right 06/05/2017   Procedure: CLOSED REDUCTION HIP;  Surgeon: [name], MD;  Location: [facility];  Service: Orthopedics;  Laterality: Right;  . TOTAL HIP REVISION Right 06/06/2017   Procedure: TOTAL HIP REVISION;  Surgeon: [name], MD;  Location: [facility];  Service: Orthopedics;  Laterality: Right;  Marland Kitchen VESICOVAGINAL FISTULA CLOSURE W/ TAH  2005    There were no vitals filed for this visit.  Subjective Assessment - 03/25/18 0853    Subjective  Pt denied falls since last visit. pt is still fatigued after watching granddaughter last week.     Pertinent History  MS, hypothyroidism, sleep apnea (no longer using CPAP per last sleep study), bladder dysfunction, COPD, asthma, arthritis, osteoporsis    Patient Stated Goals  Walk with my cane again and get R side working again    Currently in Pain?  Yes    Pain Score  3     Pain Location  Neck    Pain Orientation  Right    Pain Descriptors / Indicators  Sore    Pain Type  Acute pain    Pain Onset  Yesterday    Pain Frequency  Intermittent    Aggravating Factors   slept on it wrong    Pain Relieving Factors  neck stretches/exercises                       [facility] Adult PT Treatment/Exercise - 03/25/18 1610      Ambulation/Gait   Ambulation/Gait  Yes    Ambulation/Gait Assistance  6: Modified independent (Device/Increase time)    Ambulation/Gait Assistance Details  Pt amb. with RW over even terrain, no LOB. Pt continues to experience B genu recurvatum 2/2 weakness, PT has requested order for AFO.    Ambulation Distance (Feet)  315 Feet    Assistive device  Rolling walker    Gait  Pattern  Step-through pattern;Decreased stride length;Decreased dorsiflexion - left;Decreased dorsiflexion - right;Right genu recurvatum;Left genu recurvatum;Trunk flexed;Narrow base of support    Ambulation Surface  Level;Indoor    Gait velocity  1.47f/sec. with RW      Standardized Balance Assessment   Standardized Balance Assessment  Berg Balance Test;Timed Up and Go Test      Berg Balance Test   Sit to Stand  Able to stand without using hands and stabilize independently    Standing Unsupported  Able to stand 2 minutes with supervision    Sitting with Back Unsupported but Feet Supported on Floor or Stool  Able to sit safely and securely 2 minutes    Stand to Sit  Controls descent by using hands    Transfers  Able to transfer safely, definite need of hands    Standing Unsupported with Eyes Closed  Able to stand 10 seconds with supervision    Standing Ubsupported with Feet Together  Able to place feet together independently and stand for 1 minute with supervision    From Standing, Reach Forward with Outstretched Arm  Can reach forward >12 cm safely (5")   RUE: 7.5"   From Standing Position, Pick up Object from Floor  Able to pick up shoe, needs supervision    From Standing Position, Turn to Look Behind Over each Shoulder  Looks behind one side only/other side shows less weight shift    Turn 360 Degrees  Needs assistance while turning    Standing Unsupported, Alternately Place Feet on Step/Stool  Needs assistance to keep from falling or unable to try    Standing Unsupported, One Foot in Front  Able to take small step independently and hold 30 seconds    Standing on One Leg  Tries to lift leg/unable to hold 3 seconds but remains standing independently   L SLS   Total Score  35      Timed Up and Go Test   TUG  Normal TUG    Normal TUG (seconds)  19.16   with RW and 17.61 sec. with RW     Exercises   Exercises  Knee/Hip      Knee/Hip Exercises: Sidelying   Clams  LLE with yellow band  2x10 reps to ensure technique. Unable to perform with RLE 2/2 weakenss. Therefore, pt performed with yellow band in hooklying (hip abd with RLE only). PT incr. resistance of yellow band.              PT Education - 03/25/18 1053    Education Details  PT discussed outcome measure  results and STG progress. PT reviewed HEP verbally, but had pt perform clams and bridges to ensure proper technique and progress. PT informed pt that request for OT and AFO prescription sent to Dr. Lynann Bologna last week, pt will f/u at next appt on 03/30/18.    Person(s) Educated  Patient    Methods  Explanation;Tactile cues;Verbal cues;Handout    Comprehension  Verbalized understanding;Returned demonstration       PT Short Term Goals - 03/25/18 1058      PT SHORT TERM GOAL #1   Title  Pt will be IND in HEP to improve deficits listed above. TARGET DATE FOR ALL STGS: 03/25/18    Status  Achieved      PT SHORT TERM GOAL #2   Title  Pt will improve TUG time with LRAD to </=13.5 sec. to decr. falls risk.     Status  Partially Met      PT SHORT TERM GOAL #3   Title  Pt will amb. 300' over even terrain, at MOD I level, with LRAD to improve functional mobility.     Status  Achieved      PT SHORT TERM GOAL #4   Title  Pt will improve gait speed to >/=2.36f/sec. with LRAD to decr. falls risk.     Status  Partially Met      PT SHORT TERM GOAL #5   Title  Pt will improve BERG score to >/=34/56 to decr. falls risk.     Status  Achieved        PT Long Term Goals - 02/25/18 1358      PT LONG TERM GOAL #1   Title  Pt will improve gait speed with LRAD to >/=2.657fsec to safely amb. in the community. TARGET DATE FOR ALL LTGS: 04/22/18    Status  New      PT LONG TERM GOAL #2   Title  Pt will improve BERG score to >/=38/56 to decr. falls risk.     Status  New      PT LONG TERM GOAL #3   Title  Pt will amb. 500' over even/uneven terrain with LRAD at MOD I level to improve functional mobility.     Status  New       PT LONG TERM GOAL #4   Title  Pt will amb. 100' with SPC, at MOD I Level, over even terrain to safely use SPC at home.    Status  New            Plan - 03/25/18 1055    Clinical Impression Statement  Pt demonstrated progress, as she partially met STGs 2 and 4, and met STGs 1, 3, and 5. Pt's BERG and TUG score/time continue to indicate pt is at a high risk for falls. However, pt is making progress. Pt required rest breaks during session to avoid fatigue. Pt would continue to benefit from skilled PT to improve safety during functional mobility.     Rehab Potential  Good    Clinical Impairments Affecting Rehab Potential  see above    PT Frequency  2x / week    PT Duration  8 weeks    PT Treatment/Interventions  ADLs/Self Care Home Management;Aquatic Therapy;Biofeedback;Canalith Repostioning;Electrical Stimulation;Therapeutic exercise;Therapeutic activities;Manual techniques;Vestibular;Functional mobility training;Orthotic Fit/Training;Stair training;Gait training;Patient/family education;DME Instruction;Neuromuscular re-education    PT Next Visit Plan  LE strengthening, especially hips, hamstrings, and quads!!! balance reactions training; check for orthotic and OT order from the MD, once recieved contact [facility] to  set up consult with PT session    PT Home Exercise Plan  [email or web address]    Consulted and Agree with Plan of Care  Patient       Patient will benefit from skilled therapeutic intervention in order to improve the following deficits and impairments:  Abnormal gait, Decreased endurance, Impaired sensation, Decreased knowledge of use of DME, Decreased strength, Impaired tone, Impaired UE functional use, Decreased balance, Decreased mobility, Pain, Decreased range of motion, Postural dysfunction, Impaired flexibility, Decreased coordination(impaired tone per pt, none noted during exam, no pain during session-PT will not treat directly but will monitor closely)  Visit Diagnosis: Other  abnormalities of gait and mobility  Other lack of coordination  Muscle weakness (generalized)     Problem List Patient Active Problem List   Diagnosis Date Noted  . Ankle pain, right 08/11/2017  . Hypotension 06/07/2017  . Failed total hip arthroplasty with dislocation ([place]) 06/05/2017  . Cough variant asthma vs vcd  related to [facility]  11/10/2016  . Positional sleep apnea 09/10/2016  . Periodic limb movements of sleep 09/10/2016  . OSA on CPAP 02/08/2016  . S/P revision right [facility] 09/18/2015  . Leg swelling 08/21/2014  . Acute URI 04/04/2014  . Health care maintenance 12/16/2013  . Other and unspecified hyperlipidemia 11/04/2013  . 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 S/P hip hemiarthroplasty 03/15/2013  . Dislocation of hip prosthesis ([place]) 03/12/2013  . Hip dislocation, left ([place]) 03/12/2013  . Hip dislocation, right ([place]) 03/12/2013  . Urinary retention with incomplete bladder emptying 03/09/2013  . Dysphagia, pharyngoesophageal phase 03/09/2013  . Femoral neck fracture ([place]) 03/08/2013  . Unspecified constipation 02/26/2013  . Fractured femoral neck ([place]) 02/23/2013  . Hip fracture ([place]) 02/23/2013  . Excessive flatus 10/21/2012  . Cold feet 07/29/2012  . Hyponatremia 05/20/2012  . Calf pain 11/17/2011  . Dyspnea 12/06/2007  . Post-menopausal osteoporosis 09/17/2007  . Hypothyroidism 04/01/2007  . Multiple sclerosis ([place]) 04/01/2007  . Other diseases of vocal cords 04/01/2007    Jolly Bleicher L 03/25/2018, 11:01 AM  [facility] [address] [place], [place], [address] Phone: [phone]   Fax:  [phone]  Name: Kayla [name] MRN: [id number] Date of Birth: [birth year]  Geoffry Paradise, PT,DPT 03/25/18 11:01 AM Phone: [phone] Fax: [phone]

## 2018-03-25 NOTE — Patient Instructions (Signed)
Therex: Access Code: [email or web address]  URL: [email or web address]  Date: 03/18/2018  Prepared by: Geoffry Paradise   Exercises   Sit to Stand - 10 reps - 2 sets - 1x daily - 5x weekly  (REVIEWED)  Standing March with Counter Support - 10 reps - 3 sets - 1x daily - 5x weekly (PERFORMED WITH B UE ON RW), cues to improve B LE adduction during marches.  Heel rises with counter support - 10 reps - 3 sets - 1x daily - 5x weekly  (REVIEWED)   Romberg Stance with Head Nods - 10 reps - 3 sets - 1x daily - 5x weekly (REVIEWED)  Romberg Stance with Eyes Closed - 10 reps - 3 sets - 20 hold - 1x daily - 5x weekly (REVIEWED)  Clamshell - 10 reps - 3 sets - 1x daily - 3x weekly (PERFORMED) with cues and demo for technique.  Side Stepping with Counter Support - 3 reps - 1 sets - 1x daily - 5x weekly (REVIEWED)  Bent Knee Fallouts - 10 reps - 3 sets - 1x daily - 3x weekly (PERFORMED, RLE ONLY)  Bridge - 10 reps - 3 sets - 1x daily - 3x weekly (PERFORMED WITH BLEs), however, RLE could not perform effectively 2/2 weakness, trialed bent knee fallout with yellow band instead).   Performed in standing, supine, and hooklying with cues and demo for technique.

## 2018-03-26 DIAGNOSIS — M9903 Segmental and somatic dysfunction of lumbar region: Secondary | ICD-10-CM | POA: Diagnosis not present

## 2018-03-26 DIAGNOSIS — M50322 Other cervical disc degeneration at C5-C6 level: Secondary | ICD-10-CM | POA: Diagnosis not present

## 2018-03-26 DIAGNOSIS — M40292 Other kyphosis, cervical region: Secondary | ICD-10-CM | POA: Diagnosis not present

## 2018-03-26 DIAGNOSIS — M9901 Segmental and somatic dysfunction of cervical region: Secondary | ICD-10-CM | POA: Diagnosis not present

## 2018-03-29 ENCOUNTER — Encounter: Payer: Self-pay | Admitting: Internal Medicine

## 2018-03-29 ENCOUNTER — Ambulatory Visit (INDEPENDENT_AMBULATORY_CARE_PROVIDER_SITE_OTHER): Payer: Medicare Other | Admitting: Internal Medicine

## 2018-03-29 DIAGNOSIS — J45991 Cough variant asthma: Secondary | ICD-10-CM

## 2018-03-29 MED ORDER — AMOXICILLIN-POT CLAVULANATE 875-125 MG PO TABS: 1.0000 | ORAL_TABLET | Freq: Two times a day (BID) | ORAL | 0 refills | Status: AC

## 2018-03-29 MED ORDER — PREDNISONE 10 MG PO TABS: ORAL_TABLET | ORAL | 0 refills | Status: DC

## 2018-03-29 NOTE — Patient Instructions (Addendum)
Augmentin 875 mg take one pill twice daily  X 10 days - take at breakfast and supper with large glass of water.  It would help reduce the usual side effects (diarrhea and yeast infections) if you ate cultured yogurt at lunch.   Prednisone 10 mg take  4 each am x 2 days,   2 each am x 2 days,  1 each am x 2 days and stop    Keep your previous appt but call if not better in meantime

## 2018-03-29 NOTE — Progress Notes (Signed)
Subjective:    Patient ID: Kayla [name], female    DOB: 08-17-1950   MRN: [id number]   Brief patient profile:  30 yowf never smoker with MS since Oct 96 all neuro care through Dr [name] in [facility]),   [name] at [facility] urology,  [place] ortho  Followed here for Primary care/hypothyroid     History of Present Illness  October 04, 2009 Followup with PFT's. Pt states that her breathing is no better since last seen June 2011 for acute visit. She states she is still wheezing- ventolin helps and is using this up to tid. w/u c/w anemia  Mild dysphagia no more than usual. occ waking up wheezing at night despite zegerid at hs.   rec  Try  use dulera 100 2 bid prn   September 18 2015  R THR [name]    10/01/2015  f/u ov/Kayla [name] re: sob s/p et on dulera 100 2bid and dexilant q am  Chief Complaint  Patient presents with  . Acute Visit    Pt c/o increased DOE since had hip replacement 09/18/15. She states she gets SOB with just talking, showering or walking short distances.   has cpap machine but not using at all  - prev eval and rx by Dr [name]  rec Add pepcid 20 mg x one at bedtime Prednisone 10 mg take  4 each am x 2 days,   2 each am x 2 days,  1 each am x 2 days and stop  Start back on cpap See Dr [name] asap   11/06/2015  f/u ov/Kayla [name] re:  cpx  Chief Complaint  Patient presents with  . Follow-up    cpx- pt states she is doing well today, no complaints.     breathing better at night on cpap per Dr [name]  Doe = wm shopping / was not limited at curves prior to hip surgery but has not resumed saba once a week / no cough  rec Prevnar 13 today Please remember to go to the lab  department downstairs for your tests - we will call you with the results when they are available. 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 Follow-up    Doing well and denies any co's today.   back in CPAP as of July 2017  > no change in symptoms but wakes her up a lot / f/u per Dr [name] planned  rec See Dr [name] for all issues related to your cpap machine but it appears to be working fine by your download today  No change in medications and recs     08/08/2016  f/u ov/Kayla [name] re: multiple concerns 1) check thyroid - clinically no symptoms 2) feet stay cold/ blue with neg vascular w/u to date 3) wants referral to sports medicine as needs stay hyperextended due to MS/ no problem with falling just using cane Rec Please see patient coordinator before you leave today  to schedule referal to sports medicine and vascular surgery Please remember to go to the lab department downstairs in the basement  for your tests - we will call you with the results when they are available. Please schedule a follow up visit in 3 months but call sooner if needed  11/10/2016  f/u ov/Kayla [name] re: hyponatremia  Chief Complaint  Patient presents with  . Follow-up    recent visit to ED- 11/07/16 for hyponatremia- sodium level was 123.   Bladder dysfunction / foley dep since 11/04/16 > f/u [place] Knee hyperext better with support  Hyponatremia/ no symptoms on  Lasix daily  cpap d/c 'd 09/03/2016 with RLS not bothersome to her > neurology f/u  Asthma vs vcd    dulera 100 2bid / hfa with some sense of globus but no cough or increased sob/ noct symptoms or need for saba  rec Plan A = Automatic = dulera 100 2 pffs each am and then only take the pm the dose if you are not doing great and/or needing your rescue inhaler  Work on inhaler technique:      Plan B = Backup Only use your albuterol (ventolin) as a rescue medication   Drink gatorade if you need to push more fluids for any reason   08/11/2017  f/u ov/Kayla [name] re: f/u cough with report of new  r ankle pain./swelling  since early march 2019  Chief Complaint  Patient  presents with  . Follow-up    Cough has resolved. She is having some issues with pain and swelling in her right foot.    Dyspnea:  Limited by R ankle pain / sp R hip dislocation Cough: gone Sleep: L side or back hoziontally SABA use:  None while on dulera 100 x 2 each am  rec Refer to [facility] did not go     11/24/2017  f/u ov/Kayla [name] re: asthma/ vcd from MS and chronic R ankle/foot pain  Chief Complaint  Patient presents with  . Follow-up    complaining of occ. cough w/ white mucus,feels like something is constantly in her throat  Dyspnea:  Not limited by breathing from desired activities  But by orthopedic restrictions = esp up and down steps R ankle Cough: mucus needs to clear more day than noct s am flare Sleeping: L side but flat SABA use: rare rec Please see patient coordinator before you leave today  to schedule [facility] evaluation for R ankle pain as a second opinion GERD diet    02/23/2018  f/u ov/Kayla [name] re: asthma/vcd    Chief Complaint  Patient presents with  . Follow-up    Breathing doing well and no new co's.She rarely uses her rescue inhaler.  Dyspnea:  Still limited by R ankle (80% tear in achilles/ f/u [facility], [facility]) Cough: none Sleeping: flat bed, L side one pillow SABA use: rarely  rec Ok to [employer] doctor do the TSH and call the results to me I strongly recommend the bed blocks 6-8 in is fine    03/29/2018 acute extended ov/Kayla [name] re: uri/ cough  Chief Complaint  Patient presents with  . Acute Visit    runny nose and prod cough - yellowish.  Symptoms began 3 days ago   acute symptoms x 3 d,  started with sensation of "head cold" then grad down to chest / cough worse at hs  Has starting needing saba bid helps a little, at baseline not needing at all  No cp/ fever   No obvious day to day or daytime variability or assoc excess/ purulent sputum or mucus plugs or hemoptysis or cp or chest tightness, subjective wheeze or overt sinus or hb symptoms.     Also denies any obvious fluctuation of symptoms with weather or environmental changes or other aggravating or alleviating  factors except as outlined above   No unusual exposure hx or h/o childhood pna/ asthma or knowledge of premature birth.  Current Allergies, Complete Past Medical History, Past Surgical History, Family History, and Social History were reviewed in [employer] record.  ROS  The following are not active complaints unless bolded Hoarseness, sore throat, dysphagia, dental problems, itching, sneezing,  nasal congestion or discharge of excess mucus or purulent secretions, ear ache,   fever, chills, sweats, unintended wt loss or wt gain, classically pleuritic or exertional cp,  orthopnea pnd or arm/hand swelling  or leg swelling, presyncope, palpitations, abdominal pain, anorexia, nausea, vomiting, diarrhea  or change in bowel habits or change in bladder habits, change in stools or change in urine, dysuria, hematuria,  rash, arthralgias, visual complaints, headache, numbness, weakness or ataxia or problems with walking or coordination,  change in mood or  memory.        Current Meds  Medication Sig  . amphetamine-dextroamphetamine (ADDERALL) 20 MG tablet Take 20 mg by mouth 2 (two) times daily.   . baclofen (LIORESAL) 20 MG tablet Take 20 mg by mouth 3 (three) times daily.   . Calcium Carbonate-Vitamin D (CALCIUM 600+D) 600-400 MG-UNIT tablet Take 1 tablet by mouth daily.  . cholecalciferol (VITAMIN D) 1000 UNITS tablet Take 2,000 Units by mouth daily.   Marland Kitchen DEXILANT 60 MG capsule TAKE 1 CAPSULE ONCE DAILY WITH BREAKFAST.  Marland Kitchen FLUoxetine (PROZAC) 40 MG capsule Take 40 mg by mouth daily.    . furosemide (LASIX) 20 MG tablet Take 1 tablet (20 mg total) by mouth daily.  Marland Kitchen gabapentin (NEURONTIN) 600 MG tablet Take 600 mg by mouth 2 (two) times daily.  Marland Kitchen levothyroxine (SYNTHROID, LEVOTHROID) 100 MCG tablet TAKE 1 TABLET ONCE DAILY BEFORE BREAKFAST.  .  mometasone-formoterol (DULERA) 100-5 MCG/ACT AERO Inhale 2 puffs into the lungs every 12 (twelve) hours.  . Multiple Vitamin (MULTIVITAMIN WITH MINERALS) TABS tablet Take 1 tablet by mouth daily.  . naproxen sodium (ALEVE) 220 MG tablet Take 220 mg by mouth 2 (two) times daily with a meal.  . ocrelizumab (OCREVUS) 300 MG/10ML injection Inject 300 mg into the vein every 6 (six) months.   . Respiratory Therapy Supplies (FLUTTER) DEVI Use as directed  . temazepam (RESTORIL) 15 MG capsule Take 15 mg by mouth at bedtime.   . VENTOLIN HFA 108 (90 Base) MCG/ACT inhaler INHALE 2 PUFFS INTO LUNGS EVERY 6 HOURS AS NEEDED.                Past Medical History:  MULTIPLE SCLEROSIS (ICD-340)........................Marland Kitchen Dr Effie Shy [facility], was at [facility])  - dx 12/1994  Sleep Apnea.....................................................Marland Kitchen        Dr Melida Quitter       - CPAP rx 04/2010 HYPOTHYROIDISM (ICD-244.9)  OTHER DISEASES OF VOCAL CORDS (ICD-478.5)  Cervical Cancer > complete hysterectomy...........Marland Kitchen [name] HOARSENESS (ICD-784.49).................................  [place]  Bladder dysfunction ...............................................Marland Kitchen  [facility]  Dyspnea/wheeze ? asthma  - HFA 50 % after coaching October 04, 2009 >  50% 06/25/2010  - PFT's October 04, 2009 FEV1 2.15 (95%) with ratio 69 and DLCO 58> corrects to 98, mild insp truncation  Health Maintenance.............................................Marland KitchenWert  - Pneumovax March 28, 2009 and Prevnar 13 11/06/2015  - Td  April 10, 2009  - CPX 11/06/2015        Objective:   Physical Exam   Uncomfortable but not toxic / nasal tone to voice    Vital signs reviewed - Note on arrival 02 sats  95% on RA  /no fever   Wt  118 > 127 March 28, 2009 >129 June 17 > 128 October 04, 2009 > 129 06/25/2010 >125 10/29/2010 > 128 11/26/2010 >>127 12/10/2010 > 11/17/2011  129> 126 05/20/2012 > 07/29/2012  129 > 10/20/2012  129 >  135 06/21/2013 >  11/04/2013 135  >134 04/04/2014 > 04/18/2014 135 >  07/17/2014 132> 08/17/2014  135 > 12/20/2014 > 01/17/2015 134   > 08/08/2015   126 >  10/01/2015   130 > 11/06/2015   130 > 11/10/2016  123 > 02/10/2017  133  > 05/14/2017   129 > 08/11/2017  125 > 11/24/2017  127 >  02/23/2018  127 > 03/29/2018   128     HEENT: nl dentition,   and oropharynx. Nl external ear canals without cough reflex - mild/mod bilateral non-specific turbinate edema     NECK :  without JVD/Nodes/TM/ nl carotid upstrokes bilaterally   LUNGS: no acc muscle use,  Nl contour chest with mostly upper airway transmitted "wheezing" on insp/exp   CV:  RRR  no s3 or murmur or increase in P2, and no edema   ABD:  soft and nontender with nl inspiratory excursion in the supine position. No bruits or organomegaly appreciated, bowel sounds nl  MS:  Nl gait/ ext warm without deformities, calf tenderness, cyanosis or clubbing No obvious joint restrictions   SKIN: warm and dry with chronic purplish rubror B sym LE/ trace pitting only   NEURO:  alert, approp, nl sensorium with  no motor or cerebellar deficits apparent.          Assessment & Plan:

## 2018-03-30 ENCOUNTER — Ambulatory Visit: Payer: Medicare Other | Admitting: Physical Therapy

## 2018-03-30 DIAGNOSIS — G8191 Hemiplegia, unspecified affecting right dominant side: Secondary | ICD-10-CM | POA: Diagnosis not present

## 2018-03-31 ENCOUNTER — Encounter: Payer: Self-pay | Admitting: Internal Medicine

## 2018-03-31 DIAGNOSIS — M50322 Other cervical disc degeneration at C5-C6 level: Secondary | ICD-10-CM | POA: Diagnosis not present

## 2018-03-31 DIAGNOSIS — M9903 Segmental and somatic dysfunction of lumbar region: Secondary | ICD-10-CM | POA: Diagnosis not present

## 2018-03-31 DIAGNOSIS — M9901 Segmental and somatic dysfunction of cervical region: Secondary | ICD-10-CM | POA: Diagnosis not present

## 2018-03-31 DIAGNOSIS — M40292 Other kyphosis, cervical region: Secondary | ICD-10-CM | POA: Diagnosis not present

## 2018-03-31 NOTE — Assessment & Plan Note (Signed)
-   11/10/2016 rec try just taking 2 pffs each am since having sense of globus on 2bid with baseline poor technique - flutter valve rec 05/14/2017  - 02/23/2018  After extensive coaching inhaler device,  effectiveness =    75% (short Ti)  Acute flare in setting of uri with prominent upper airway features ? Sinusitis developing > rx augmentin x 10 day and Prednisone 10 mg take  4 each am x 2 days,   2 each am x 2 days,  1 each am x 2 days and stop   Reviewed use of prns/ flutter valve in this setting  F/u with regular appt - sooner prn

## 2018-04-01 ENCOUNTER — Telehealth: Payer: Self-pay

## 2018-04-01 ENCOUNTER — Ambulatory Visit: Payer: Medicare Other

## 2018-04-01 DIAGNOSIS — R278 Other lack of coordination: Secondary | ICD-10-CM | POA: Diagnosis not present

## 2018-04-01 DIAGNOSIS — R2689 Other abnormalities of gait and mobility: Secondary | ICD-10-CM | POA: Diagnosis not present

## 2018-04-01 DIAGNOSIS — M6281 Muscle weakness (generalized): Secondary | ICD-10-CM

## 2018-04-01 DIAGNOSIS — R293 Abnormal posture: Secondary | ICD-10-CM | POA: Diagnosis not present

## 2018-04-01 DIAGNOSIS — R2681 Unsteadiness on feet: Secondary | ICD-10-CM | POA: Diagnosis not present

## 2018-04-01 NOTE — Therapy (Signed)
[facility] [address] [place], [place], [address] Phone: [phone]   Fax:  [phone]  Physical Therapy Treatment  Patient Details  Name: Kayla [name] MRN: [id number] Date of Birth: 11/28/1950 Referring Provider (PT): Dr. Almedia Balls   Encounter Date: 04/01/2018  PT End of Session - 04/01/18 0953    Visit Number  7    Number of Visits  17    Date for PT Re-Evaluation  04/26/18    Authorization Type  Medicare primary and BCBS secondary    PT Start Time  0851    PT Stop Time  0931    PT Time Calculation (min)  40 min    Equipment Utilized During Treatment  Gait belt    Activity Tolerance  Patient tolerated treatment well;Patient limited by fatigue    Behavior During Therapy  [facility] for tasks assessed/performed       Past Medical History:  Diagnosis Date  . Arthritis    oa  . Cervical cancer ([place]) 2005  . COPD (chronic obstructive pulmonary disease) ([place])   . Multiple sclerosis ([place])   . Osteoporosis   . Other diseases of vocal cords    vocal cords are not even  . Other voice and resonance disorders   . Pneumonia 2014, 1992  . Sleep apnea    no cpap used  . Unspecified hypothyroidism     Past Surgical History:  Procedure Laterality Date  . ABDOMINAL HYSTERECTOMY  2005   for cervical cancer, complete  . CONVERSION TO TOTAL HIP Right 09/18/2015   Procedure: CONVERSION OF RIGHT HEMI ARTHROPLASTY TO TOTAL HIP ARTHROPLASTY ACETABULAR REVISON ;  Surgeon: Paralee Cancel, MD;  Location: [facility];  Service: Orthopedics;  Laterality: Right;  . EYE SURGERY Bilateral    lens for cataracts  . HIP ARTHROPLASTY Right 03/01/2013   Procedure: ARTHROPLASTY BIPOLAR HIP;  Surgeon: Mauri Pole, MD;  Location: [facility];  Service: Orthopedics;  Laterality: Right;  . HIP CLOSED REDUCTION Right 03/12/2013   Procedure: CLOSED REDUCTION HIP;  Surgeon: Gearlean Alf, MD;  Location: [facility];  Service: Orthopedics;  Laterality: Right;   . HIP CLOSED REDUCTION Right 03/19/2013   Procedure: CLOSED REDUCTION HIP;  Surgeon: Johnn Hai, MD;  Location: [facility];  Service: Orthopedics;  Laterality: Right;  . HIP CLOSED REDUCTION Right 11/10/2015   Procedure: CLOSED MANIPULATION HIP;  Surgeon: Rod Can, MD;  Location: [facility];  Service: Orthopedics;  Laterality: Right;  . HIP CLOSED REDUCTION Right 06/05/2017   Procedure: CLOSED REDUCTION HIP;  Surgeon: [name], MD;  Location: [facility];  Service: Orthopedics;  Laterality: Right;  . TOTAL HIP REVISION Right 06/06/2017   Procedure: TOTAL HIP REVISION;  Surgeon: [name], MD;  Location: [facility];  Service: Orthopedics;  Laterality: Right;  Marland Kitchen VESICOVAGINAL FISTULA CLOSURE W/ TAH  2005    There were no vitals filed for this visit.  Subjective Assessment - 04/01/18 0854    Subjective  Pt reported she went to MD on Monday and was put on antibiotic and steriod for upper respiratory infection. Pt denied falls. Pt reported Dr. Lynann Bologna told pt she doesn't feel an orthotic     Pertinent History  MS, hypothyroidism, sleep apnea (no longer using CPAP per last sleep study), bladder dysfunction, COPD, asthma, arthritis, osteoporsis    Patient Stated Goals  Walk with my cane again and get R side working again    Currently in Pain?  No/denies  [facility] Adult PT Treatment/Exercise - 04/01/18 7858      Ambulation/Gait   Ambulation/Gait  Yes    Ambulation/Gait Assistance  6: Modified independent (Device/Increase time);5: Supervision    Ambulation/Gait Assistance Details  Pt fatigued today 2/2 illness and noted to experience incr. B genu recurvatum during gait and decr. toe clearance, which improved with cues and rest.     Ambulation Distance (Feet)  75 Feet   40'x2, 230', 10'x2   Assistive device  Rolling walker    Gait Pattern  Step-through pattern;Decreased stride length;Decreased dorsiflexion - left;Decreased dorsiflexion - right;Right genu  recurvatum;Left genu recurvatum;Trunk flexed;Narrow base of support    Ambulation Surface  Level;Indoor          Balance Exercises - 04/01/18 0949      Balance Exercises: Standing   Rockerboard  Anterior/posterior;Lateral;Head turns;EO;EC;10 seconds;30 seconds;10 reps;Intermittent UE support   short rockerboard   Marching Limitations  B genu recurvatum during SLS, pt able to correct with cues and B UE support on counter. 2x10 reps.     Other Standing Exercises  Performed either in // bars or at counter with intermittent UE support and min A to S for safety. Cues and demo for technique (shift to midline vs. LLE, improve ant. weight shifting,).         PT Education - 04/01/18 8502    Education Details  PT discussed sending OT and orthotic request to pt's neurologist as requests are needed 2/2 MS vs. Achilles tear. Pt agreeable and stated pt's neurologist is Dr. Effie Shy at [facility] in [place]. PT send request.     Person(s) Educated  Patient    Methods  Explanation    Comprehension  Verbalized understanding       PT Short Term Goals - 03/25/18 1058      PT SHORT TERM GOAL #1   Title  Pt will be IND in HEP to improve deficits listed above. TARGET DATE FOR ALL STGS: 03/25/18    Status  Achieved      PT SHORT TERM GOAL #2   Title  Pt will improve TUG time with LRAD to </=13.5 sec. to decr. falls risk.     Status  Partially Met      PT SHORT TERM GOAL #3   Title  Pt will amb. 300' over even terrain, at MOD I level, with LRAD to improve functional mobility.     Status  Achieved      PT SHORT TERM GOAL #4   Title  Pt will improve gait speed to >/=2.32f/sec. with LRAD to decr. falls risk.     Status  Partially Met      PT SHORT TERM GOAL #5   Title  Pt will improve BERG score to >/=34/56 to decr. falls risk.     Status  Achieved        PT Long Term Goals - 02/25/18 1358      PT LONG TERM GOAL #1   Title  Pt will improve gait speed with LRAD to  >/=2.647fsec to safely amb. in the community. TARGET DATE FOR ALL LTGS: 04/22/18    Status  New      PT LONG TERM GOAL #2   Title  Pt will improve BERG score to >/=38/56 to decr. falls risk.     Status  New      PT LONG TERM GOAL #3   Title  Pt will amb. 500' over even/uneven terrain with LRAD at MOD  I level to improve functional mobility.     Status  New      PT LONG TERM GOAL #4   Title  Pt will amb. 100' with SPC, at MOD I Level, over even terrain to safely use SPC at home.    Status  New            Plan - 04/01/18 4174    Clinical Impression Statement  Pt demonstrated progress, as she tolerated high level balance activities with min A to S to maintain balance and ensure safety. Pt did require standing and seated rest breaks 2/2 fatigue from recent upper respiratory infection. Pt continues to experience poor toe clearance and B genu recurvatum during stance phase of gait, OT and orthotic eval requests sent to neurologist. Continue with POC.     Rehab Potential  Good    Clinical Impairments Affecting Rehab Potential  see above    PT Frequency  2x / week    PT Duration  8 weeks    PT Treatment/Interventions  ADLs/Self Care Home Management;Aquatic Therapy;Biofeedback;Canalith Repostioning;Electrical Stimulation;Therapeutic exercise;Therapeutic activities;Manual techniques;Vestibular;Functional mobility training;Orthotic Fit/Training;Stair training;Gait training;Patient/family education;DME Instruction;Neuromuscular re-education    PT Next Visit Plan  LE strengthening, especially hips, hamstrings, and quads!!! balance reactions training; check for orthotic and OT order from the MD, once recieved contact [facility] to set up consult with PT session    PT Home Exercise Plan  [id number]    Consulted and Agree with Plan of Care  Patient       Patient will benefit from skilled therapeutic intervention in order to improve the following deficits and impairments:  Abnormal gait, Decreased  endurance, Impaired sensation, Decreased knowledge of use of DME, Decreased strength, Impaired tone, Impaired UE functional use, Decreased balance, Decreased mobility, Pain, Decreased range of motion, Postural dysfunction, Impaired flexibility, Decreased coordination(impaired tone per pt, none noted during exam, no pain during session-PT will not treat directly but will monitor closely)  Visit Diagnosis: Other abnormalities of gait and mobility  Other lack of coordination  Muscle weakness (generalized)     Problem List Patient Active Problem List   Diagnosis Date Noted  . Ankle pain, right 08/11/2017  . Hypotension 06/07/2017  . Failed total hip arthroplasty with dislocation ([place]) 06/05/2017  . Cough variant asthma vs vcd  related to [facility]  11/10/2016  . Positional sleep apnea 09/10/2016  . Periodic limb movements of sleep 09/10/2016  . OSA on CPAP 02/08/2016  . S/P revision right [facility] 09/18/2015  . Leg swelling 08/21/2014  . Acute URI 04/04/2014  . Health care maintenance 12/16/2013  . Other and unspecified hyperlipidemia 11/04/2013  . 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 S/P hip hemiarthroplasty 03/15/2013  . Dislocation of hip prosthesis ([place]) 03/12/2013  . Hip dislocation, left ([place]) 03/12/2013  . Hip dislocation, right ([place]) 03/12/2013  . Urinary retention with incomplete bladder emptying 03/09/2013  . Dysphagia, pharyngoesophageal phase 03/09/2013  . Femoral neck fracture ([place]) 03/08/2013  . Unspecified constipation 02/26/2013  . Fractured femoral neck ([place]) 02/23/2013  . Hip fracture ([place]) 02/23/2013  . Excessive flatus 10/21/2012  . Cold feet 07/29/2012  . Hyponatremia 05/20/2012  . Calf pain 11/17/2011  . Dyspnea 12/06/2007  . Post-menopausal osteoporosis 09/17/2007  . Hypothyroidism 04/01/2007  . Multiple sclerosis ([place]) 04/01/2007  . Other diseases of vocal cords 04/01/2007    Miller,Jennifer L 04/01/2018, 9:56 AM  [facility] [address] [place], [place], [address] Phone: [phone]   Fax:  [phone]  Name: Kayla [name] MRN: [id number] Date of Birth: [birth year]  Geoffry Paradise, PT,DPT 04/01/18 9:56 AM Phone: [phone] Fax: [phone]

## 2018-04-01 NOTE — Telephone Encounter (Signed)
Dr. Jacqulynn Cadet  I'm currently seeing Ms. Shorb for PT. She continues to experience incr. B genu recurvatum during the stance phase of gait and difficulty with toe clearance. Her current AFO does not address these gait deviations. I feel she would benefit from a new brace, if you agree please send [place] an order.  Additionally, Ms. Coudriet reports difficulty performing ADLs (dressing, reaching overhead) 2/2 UE weakness. If you feel she would benefit from an OT eval, please send [place] an order.  Thank you, Geoffry Paradise, PT,DPT 04/01/18 9:39 AM Phone: [phone] Fax: [phone]

## 2018-04-02 DIAGNOSIS — M40292 Other kyphosis, cervical region: Secondary | ICD-10-CM | POA: Diagnosis not present

## 2018-04-02 DIAGNOSIS — M9901 Segmental and somatic dysfunction of cervical region: Secondary | ICD-10-CM | POA: Diagnosis not present

## 2018-04-02 DIAGNOSIS — M50322 Other cervical disc degeneration at C5-C6 level: Secondary | ICD-10-CM | POA: Diagnosis not present

## 2018-04-02 DIAGNOSIS — M9903 Segmental and somatic dysfunction of lumbar region: Secondary | ICD-10-CM | POA: Diagnosis not present

## 2018-04-05 DIAGNOSIS — M9901 Segmental and somatic dysfunction of cervical region: Secondary | ICD-10-CM | POA: Diagnosis not present

## 2018-04-05 DIAGNOSIS — M9903 Segmental and somatic dysfunction of lumbar region: Secondary | ICD-10-CM | POA: Diagnosis not present

## 2018-04-05 DIAGNOSIS — M40292 Other kyphosis, cervical region: Secondary | ICD-10-CM | POA: Diagnosis not present

## 2018-04-05 DIAGNOSIS — M50322 Other cervical disc degeneration at C5-C6 level: Secondary | ICD-10-CM | POA: Diagnosis not present

## 2018-04-06 ENCOUNTER — Ambulatory Visit: Payer: Medicare Other | Admitting: Physical Therapy

## 2018-04-06 ENCOUNTER — Encounter: Payer: Self-pay | Admitting: Physical Therapy

## 2018-04-06 DIAGNOSIS — R278 Other lack of coordination: Secondary | ICD-10-CM

## 2018-04-06 DIAGNOSIS — R293 Abnormal posture: Secondary | ICD-10-CM | POA: Diagnosis not present

## 2018-04-06 DIAGNOSIS — R2689 Other abnormalities of gait and mobility: Secondary | ICD-10-CM

## 2018-04-06 DIAGNOSIS — M6281 Muscle weakness (generalized): Secondary | ICD-10-CM | POA: Diagnosis not present

## 2018-04-06 DIAGNOSIS — R2681 Unsteadiness on feet: Secondary | ICD-10-CM | POA: Diagnosis not present

## 2018-04-06 NOTE — Therapy (Signed)
[facility] [address] [place], [place], [address] Phone: [phone]   Fax:  [phone]  Physical Therapy Treatment  Patient Details  Name: Kayla [name] MRN: [id number] Date of Birth: [birth year] Referring Provider (PT): Dr. Almedia Balls   Encounter Date: 04/06/2018  PT End of Session - 04/06/18 0855    Visit Number  8    Number of Visits  17    Date for PT Re-Evaluation  04/26/18    Authorization Type  Medicare primary and BCBS secondary    PT Start Time  [id number]   pt late for apt today   PT Stop Time  0932    PT Time Calculation (min)  39 min    Equipment Utilized During Treatment  Gait belt    Activity Tolerance  Patient tolerated treatment well;Patient limited by fatigue    Behavior During Therapy  [facility] for tasks assessed/performed       Past Medical History:  Diagnosis Date  . Arthritis    oa  . Cervical cancer ([place]) 2005  . COPD (chronic obstructive pulmonary disease) ([place])   . Multiple sclerosis ([place])   . Osteoporosis   . Other diseases of vocal cords    vocal cords are not even  . Other voice and resonance disorders   . Pneumonia 2014, 1992  . Sleep apnea    no cpap used  . Unspecified hypothyroidism     Past Surgical History:  Procedure Laterality Date  . ABDOMINAL HYSTERECTOMY  2005   for cervical cancer, complete  . CONVERSION TO TOTAL HIP Right 09/18/2015   Procedure: CONVERSION OF RIGHT HEMI ARTHROPLASTY TO TOTAL HIP ARTHROPLASTY ACETABULAR REVISON ;  Surgeon: Paralee Cancel, MD;  [facility];  Service: Orthopedics;  Laterality: Right;  . EYE SURGERY Bilateral    lens for cataracts  . HIP ARTHROPLASTY Right 03/01/2013   Procedure: ARTHROPLASTY BIPOLAR HIP;  Surgeon: Mauri Pole, MD;  [facility];  Service: Orthopedics;  Laterality: Right;  . HIP CLOSED REDUCTION Right 03/12/2013   Procedure: CLOSED REDUCTION HIP;  Surgeon: Gearlean Alf, MD;  Location: [facility];  Service:  Orthopedics;  Laterality: Right;  . HIP CLOSED REDUCTION Right 03/19/2013   Procedure: CLOSED REDUCTION HIP;  Surgeon: Johnn Hai, MD;  Location: [facility];  Service: Orthopedics;  Laterality: Right;  . HIP CLOSED REDUCTION Right 11/10/2015   Procedure: CLOSED MANIPULATION HIP;  Surgeon: Rod Can, MD;  [facility];  Service: Orthopedics;  Laterality: Right;  . HIP CLOSED REDUCTION Right 06/05/2017   Procedure: CLOSED REDUCTION HIP;  Surgeon: [name], MD;  [facility];  Service: Orthopedics;  Laterality: Right;  . TOTAL HIP REVISION Right 06/06/2017   Procedure: TOTAL HIP REVISION;  Surgeon: [name], MD;  [facility];  Service: Orthopedics;  Laterality: Right;  [facility] Kitchen VESICOVAGINAL FISTULA CLOSURE W/ TAH  2005    There were no vitals filed for this visit.  Subjective Assessment - 04/06/18 0854    Subjective  No new complaints. No falls or pain to report.     Pertinent History  MS, hypothyroidism, sleep apnea (no longer using CPAP per last sleep study), bladder dysfunction, COPD, asthma, arthritis, osteoporsis    Patient Stated Goals  Walk with my cane again and get R side working again    Currently in Pain?  No/denies    Pain Score  0-No pain           [facility] Adult PT  Treatment/Exercise - 04/06/18 0856      Knee/Hip Exercises: Seated   Long Arc Quad  Strengthening;Both;1 set;10 reps;Weights;Limitations    Long Arc Quad Weight  2 lbs.    Long [employer] Limitations  cues for slow, controlled movements    Hamstring Curl  Strengthening;Both;1 set;10 reps;Limitations    Hamstring Limitations  with red band resistance      Knee/Hip Exercises: Supine   Bridges  AROM;Strengthening;1 set;Both;10 reps;Limitations    Bridges Limitations  cues for hold time, no physical assistance needed today    Straight Leg Raises  AROM;Strengthening;Both;1 set;10 reps;Limitations    Straight Leg Raises Limitations  cues for hold time, no physical assistance needed    Other  Supine Knee/Hip Exercises  hook lying with red band around knees: hip falls outs x 10 reps each leg with assist needed to stabilize non moving legs. decreased motion noted on right LE vs left LE.       Knee/Hip Exercises: Prone   Hamstring Curl  1 set;10 reps;Limitations    Hamstring Curl Limitations  assist needed for control and form    Hip Extension  AROM;AAROM;Strengthening;Both;1 set;10 reps;Limitations    Hip Extension Limitations  assistance needed to maintain knee extension, for lift and control. >assist needed with left LE.           Balance Exercises - 04/06/18 0916      Balance Exercises: Standing   Rockerboard  Anterior/posterior;Lateral;Head turns;EO;EC;30 seconds;10 reps      Balance Exercises: Standing   Rebounder Limitations  performed both ways on balance board with no UE support: rocking board with EO, progressing to EC with emphasis on tall posture; holding board steady: EC no head movements, progressing to EC head movements left<>right, up<>down. min guard to min assist for balance with cues on posture and weight shifting to assist with balance.           PT Short Term Goals - 03/25/18 1058      PT SHORT TERM GOAL #1   Title  Pt will be IND in HEP to improve deficits listed above. TARGET DATE FOR ALL STGS: 03/25/18    Status  Achieved      PT SHORT TERM GOAL #2   Title  Pt will improve TUG time with LRAD to </=13.5 sec. to decr. falls risk.     Status  Partially Met      PT SHORT TERM GOAL #3   Title  Pt will amb. 300' over even terrain, at MOD I level, with LRAD to improve functional mobility.     Status  Achieved      PT SHORT TERM GOAL #4   Title  Pt will improve gait speed to >/=2.80f/sec. with LRAD to decr. falls risk.     Status  Partially Met      PT SHORT TERM GOAL #5   Title  Pt will improve BERG score to >/=34/56 to decr. falls risk.     Status  Achieved        PT Long Term Goals - 02/25/18 1358      PT LONG TERM GOAL #1   Title  Pt  will improve gait speed with LRAD to >/=2.687fsec to safely amb. in the community. TARGET DATE FOR ALL LTGS: 04/22/18    Status  New      PT LONG TERM GOAL #2   Title  Pt will improve BERG score to >/=38/56 to decr. falls risk.     Status  New      PT LONG TERM GOAL #3   Title  Pt will amb. 500' over even/uneven terrain with LRAD at MOD I level to improve functional mobility.     Status  New      PT LONG TERM GOAL #4   Title  Pt will amb. 100' with SPC, at MOD I Level, over even terrain to safely use SPC at home.    Status  New            Plan - 04/06/18 0855    Clinical Impression Statement  Today's skilled session continued to focus on LE strengthening and balance reactions with no issues reported. A few rest breaks needed today between ex's only. The pt is progressing toward goals and should benefit from continued PT to progress toward unmet goals.     Rehab Potential  Good    Clinical Impairments Affecting Rehab Potential  see above    PT Frequency  2x / week    PT Duration  8 weeks    PT Treatment/Interventions  ADLs/Self Care Home Management;Aquatic Therapy;Biofeedback;Canalith Repostioning;Electrical Stimulation;Therapeutic exercise;Therapeutic activities;Manual techniques;Vestibular;Functional mobility training;Orthotic Fit/Training;Stair training;Gait training;Patient/family education;DME Instruction;Neuromuscular re-education    PT Next Visit Plan  LE strengthening, especially hips, hamstrings, and quads!!! balance reactions training; check for orthotic and OT order from the MD, once recieved contact [employer] to set up consult with PT session    PT Home Exercise Plan  [email or web address]    Consulted and Agree with Plan of Care  Patient       Patient will benefit from skilled therapeutic intervention in order to improve the following deficits and impairments:  Abnormal gait, Decreased endurance, Impaired sensation, Decreased knowledge of use of DME, Decreased strength, Impaired tone,  Impaired UE functional use, Decreased balance, Decreased mobility, Pain, Decreased range of motion, Postural dysfunction, Impaired flexibility, Decreased coordination(impaired tone per pt, none noted during exam, no pain during session-PT will not treat directly but will monitor closely)  Visit Diagnosis: Other abnormalities of gait and mobility  Other lack of coordination  Muscle weakness (generalized)     Problem List Patient Active Problem List   Diagnosis Date Noted  . Ankle pain, right 08/11/2017  . Hypotension 06/07/2017  . Failed total hip arthroplasty with dislocation ([place]) 06/05/2017  . Cough variant asthma vs vcd  related to [facility]  11/10/2016  . Positional sleep apnea 09/10/2016  . Periodic limb movements of sleep 09/10/2016  . OSA on CPAP 02/08/2016  . S/P revision right [facility] 09/18/2015  . Leg swelling 08/21/2014  . Acute URI 04/04/2014  . Health care maintenance 12/16/2013  . Other and unspecified hyperlipidemia 11/04/2013  . 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 S/P hip hemiarthroplasty 03/15/2013  . Dislocation of hip prosthesis ([place]) 03/12/2013  . Hip dislocation, left ([place]) 03/12/2013  . Hip dislocation, right ([place]) 03/12/2013  . Urinary retention with incomplete bladder emptying 03/09/2013  . Dysphagia, pharyngoesophageal phase 03/09/2013  . Femoral neck fracture ([place]) 03/08/2013  . Unspecified constipation 02/26/2013  . Fractured femoral neck ([place]) 02/23/2013  . Hip fracture ([place]) 02/23/2013  . Excessive flatus 10/21/2012  . Cold feet 07/29/2012  . Hyponatremia 05/20/2012  . Calf pain 11/17/2011  . Dyspnea 12/06/2007  . Post-menopausal osteoporosis 09/17/2007  . Hypothyroidism 04/01/2007  . Multiple sclerosis ([place]) 04/01/2007  . Other diseases of vocal cords 04/01/2007    Willow Ora, PTA, [facility] Outpatient Neuro [facility] [address] [place], [place] [address] [phone] 04/06/18, 3:11 PM   Name:  Kayla [name] MRN: [id number] Date of Birth:  04-Jun-1950

## 2018-04-08 ENCOUNTER — Ambulatory Visit: Payer: Medicare Other

## 2018-04-08 DIAGNOSIS — R293 Abnormal posture: Secondary | ICD-10-CM | POA: Diagnosis not present

## 2018-04-08 DIAGNOSIS — R2689 Other abnormalities of gait and mobility: Secondary | ICD-10-CM | POA: Diagnosis not present

## 2018-04-08 DIAGNOSIS — R278 Other lack of coordination: Secondary | ICD-10-CM

## 2018-04-08 DIAGNOSIS — M6281 Muscle weakness (generalized): Secondary | ICD-10-CM | POA: Diagnosis not present

## 2018-04-08 DIAGNOSIS — R2681 Unsteadiness on feet: Secondary | ICD-10-CM | POA: Diagnosis not present

## 2018-04-08 NOTE — Therapy (Signed)
[facility] [address] [place], [place], [address] Phone: [phone]   Fax:  [phone]  Physical Therapy Treatment  Patient Details  Name: Kayla [name] MRN: [id number] Date of Birth: 1950/09/26 Referring Provider (PT): Dr. Almedia Balls   Encounter Date: 04/08/2018  PT End of Session - 04/08/18 0941    Visit Number  9    Number of Visits  17    Date for PT Re-Evaluation  04/26/18    Authorization Type  Medicare primary and BCBS secondary    PT Start Time  [phone]   pt late   PT Stop Time  0932    PT Time Calculation (min)  41 min    Equipment Utilized During Treatment  --   S for safety   Activity Tolerance  Patient tolerated treatment well    Behavior During Therapy  [facility] for tasks assessed/performed       Past Medical History:  Diagnosis Date  . Arthritis    oa  . Cervical cancer ([place]) 2005  . COPD (chronic obstructive pulmonary disease) ([place])   . Multiple sclerosis ([place])   . Osteoporosis   . Other diseases of vocal cords    vocal cords are not even  . Other voice and resonance disorders   . Pneumonia 2014, 1992  . Sleep apnea    no cpap used  . Unspecified hypothyroidism     Past Surgical History:  Procedure Laterality Date  . ABDOMINAL HYSTERECTOMY  2005   for cervical cancer, complete  . CONVERSION TO TOTAL HIP Right 09/18/2015   Procedure: CONVERSION OF RIGHT HEMI ARTHROPLASTY TO TOTAL HIP ARTHROPLASTY ACETABULAR REVISON ;  Surgeon: Paralee Cancel, MD;  [facility];  Service: Orthopedics;  Laterality: Right;  . EYE SURGERY Bilateral    lens for cataracts  . HIP ARTHROPLASTY Right 03/01/2013   Procedure: ARTHROPLASTY BIPOLAR HIP;  Surgeon: Mauri Pole, MD;  [facility];  Service: Orthopedics;  Laterality: Right;  . HIP CLOSED REDUCTION Right 03/12/2013   Procedure: CLOSED REDUCTION HIP;  Surgeon: Gearlean Alf, MD;  Location: [facility];  Service: Orthopedics;  Laterality: Right;  . HIP  CLOSED REDUCTION Right 03/19/2013   Procedure: CLOSED REDUCTION HIP;  Surgeon: Johnn Hai, MD;  Location: [facility];  Service: Orthopedics;  Laterality: Right;  . HIP CLOSED REDUCTION Right 11/10/2015   Procedure: CLOSED MANIPULATION HIP;  Surgeon: Rod Can, MD;  [facility];  Service: Orthopedics;  Laterality: Right;  . HIP CLOSED REDUCTION Right 06/05/2017   Procedure: CLOSED REDUCTION HIP;  Surgeon: [name], MD;  [facility];  Service: Orthopedics;  Laterality: Right;  . TOTAL HIP REVISION Right 06/06/2017   Procedure: TOTAL HIP REVISION;  Surgeon: [name], MD;  [facility];  Service: Orthopedics;  Laterality: Right;  Marland Kitchen VESICOVAGINAL FISTULA CLOSURE W/ TAH  2005    There were no vitals filed for this visit.  Subjective Assessment - 04/08/18 0854    Subjective  Pt denied falls or changes since last visit.     Pertinent History  MS, hypothyroidism, sleep apnea (no longer using CPAP per last sleep study), bladder dysfunction, COPD, asthma, arthritis, osteoporsis    Patient Stated Goals  Walk with my cane again and get R side working again    Currently in Pain?  No/denies                       [facility] Adult PT  Treatment/Exercise - 04/08/18 0904      Ambulation/Gait   Ambulation/Gait  Yes    Ambulation/Gait Assistance  5: Supervision;6: Modified independent (Device/Increase time)    Ambulation/Gait Assistance Details  Pt had R compression knee brace donned during amb. (per orthopedic MD), brace did not decr. R genu recurvatum. MOD I over even terrain but S for safety over paved/uneven terrain to ensure safety and to pick up RW over sidewalk gaps. Cues to decr. B genu recurvatum and improve trunk flexion. HR after amb: 89bpm and SaO2 on room air: 98% and no c/o SOB. PT trialed taller RW to improve upright posture during amb.    Ambulation Distance (Feet)  500 Feet   outdoors and 115', 50'x2 indoors with taller RW   Assistive device  Rolling  walker    Gait Pattern  Step-through pattern;Decreased stride length;Decreased dorsiflexion - left;Decreased dorsiflexion - right;Right genu recurvatum;Left genu recurvatum;Trunk flexed;Narrow base of support    Ambulation Surface  Unlevel;Level;Indoor;Outdoor;Paved      Knee/Hip Exercises: Standing   Terminal Knee Extension  Strengthening;Both;3 sets;10 reps;Theraband    Theraband Level (Terminal Knee Extension)  Level 2 (Red)    Terminal Knee Extension Limitations  Standing with BUE support in // bars with cues and demo for technique and to control terminal knee ext and to decr. genu recuravtum (B). Provided as HEP.              PT Education - 04/08/18 0940    Education Details  PT provided pt with B terminal knee ext HEP with red theraband. PT also suggested RW at incr. height to improve upright posture-printed RW information and gave copy to pt.     Person(s) Educated  Patient    Methods  Explanation;Demonstration;Tactile cues;Verbal cues;Handout    Comprehension  Returned demonstration;Verbalized understanding       PT Short Term Goals - 03/25/18 1058      PT SHORT TERM GOAL #1   Title  Pt will be IND in HEP to improve deficits listed above. TARGET DATE FOR ALL STGS: 03/25/18    Status  Achieved      PT SHORT TERM GOAL #2   Title  Pt will improve TUG time with LRAD to </=13.5 sec. to decr. falls risk.     Status  Partially Met      PT SHORT TERM GOAL #3   Title  Pt will amb. 300' over even terrain, at MOD I level, with LRAD to improve functional mobility.     Status  Achieved      PT SHORT TERM GOAL #4   Title  Pt will improve gait speed to >/=2.37f/sec. with LRAD to decr. falls risk.     Status  Partially Met      PT SHORT TERM GOAL #5   Title  Pt will improve BERG score to >/=34/56 to decr. falls risk.     Status  Achieved        PT Long Term Goals - 02/25/18 1358      PT LONG TERM GOAL #1   Title  Pt will improve gait speed with LRAD to >/=2.621fsec to  safely amb. in the community. TARGET DATE FOR ALL LTGS: 04/22/18    Status  New      PT LONG TERM GOAL #2   Title  Pt will improve BERG score to >/=38/56 to decr. falls risk.     Status  New      PT LONG TERM GOAL #3  Title  Pt will amb. 500' over even/uneven terrain with LRAD at MOD I level to improve functional mobility.     Status  New      PT LONG TERM GOAL #4   Title  Pt will amb. 100' with SPC, at MOD I Level, over even terrain to safely use SPC at home.    Status  New            Plan - 04/08/18 8588    Clinical Impression Statement  Pt demonstrated progress as she was able to demonstrate improved B heel strike with minimal cues, decr. toe clearance noted after amb. approx. 300' outdoors.  Pt demonstrated improve upright poustre with taller RW. Pt was able to control B genu recurvatum during terminal knee extension HEP with red theraband, therefore, PT provided it as HEP. Continue with POC.     Rehab Potential  Good    Clinical Impairments Affecting Rehab Potential  see above    PT Frequency  2x / week    PT Duration  8 weeks    PT Treatment/Interventions  ADLs/Self Care Home Management;Aquatic Therapy;Biofeedback;Canalith Repostioning;Electrical Stimulation;Therapeutic exercise;Therapeutic activities;Manual techniques;Vestibular;Functional mobility training;Orthotic Fit/Training;Stair training;Gait training;Patient/family education;DME Instruction;Neuromuscular re-education    PT Next Visit Plan  Continue LE strengthening, especially hips, hamstrings, and quads!!! balance reactions training; check for orthotic and OT order from the MD, once recieved contact [employer] to set up consult with PT session    PT Home Exercise Plan  [email or web address]    Consulted and Agree with Plan of Care  Patient       Patient will benefit from skilled therapeutic intervention in order to improve the following deficits and impairments:  Abnormal gait, Decreased endurance, Impaired sensation, Decreased  knowledge of use of DME, Decreased strength, Impaired tone, Impaired UE functional use, Decreased balance, Decreased mobility, Pain, Decreased range of motion, Postural dysfunction, Impaired flexibility, Decreased coordination(impaired tone per pt, none noted during exam, no pain during session-PT will not treat directly but will monitor closely)  Visit Diagnosis: Other abnormalities of gait and mobility  Muscle weakness (generalized)  Other lack of coordination     Problem List Patient Active Problem List   Diagnosis Date Noted  . Ankle pain, right 08/11/2017  . Hypotension 06/07/2017  . Failed total hip arthroplasty with dislocation ([place]) 06/05/2017  . Cough variant asthma vs vcd  related to [facility]  11/10/2016  . Positional sleep apnea 09/10/2016  . Periodic limb movements of sleep 09/10/2016  . OSA on CPAP 02/08/2016  . S/P revision right [facility] 09/18/2015  . Leg swelling 08/21/2014  . Acute URI 04/04/2014  . Health care maintenance 12/16/2013  . Other and unspecified hyperlipidemia 11/04/2013  . 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 S/P hip hemiarthroplasty 03/15/2013  . Dislocation of hip prosthesis ([place]) 03/12/2013  . Hip dislocation, left ([place]) 03/12/2013  . Hip dislocation, right ([place]) 03/12/2013  . Urinary retention with incomplete bladder emptying 03/09/2013  . Dysphagia, pharyngoesophageal phase 03/09/2013  . Femoral neck fracture ([place]) 03/08/2013  . Unspecified constipation 02/26/2013  . Fractured femoral neck ([place]) 02/23/2013  . Hip fracture ([place]) 02/23/2013  . Excessive flatus 10/21/2012  . Cold feet 07/29/2012  . Hyponatremia 05/20/2012  . Calf pain 11/17/2011  . Dyspnea 12/06/2007  . Post-menopausal osteoporosis 09/17/2007  . Hypothyroidism 04/01/2007  . Multiple sclerosis ([place]) 04/01/2007  . Other diseases of vocal cords 04/01/2007    Yousif Edelson L 04/08/2018, 9:55 AM  [facility] [address]  [place], [place], [address] Phone: [phone]   Fax:  [phone]  Name: Kayla [name] MRN: [id number] Date of Birth: 26-Sep-1950  Geoffry Paradise, PT,DPT 04/08/18 9:56 AM Phone: [phone] Fax: [phone]

## 2018-04-12 DIAGNOSIS — M9901 Segmental and somatic dysfunction of cervical region: Secondary | ICD-10-CM | POA: Diagnosis not present

## 2018-04-12 DIAGNOSIS — M50322 Other cervical disc degeneration at C5-C6 level: Secondary | ICD-10-CM | POA: Diagnosis not present

## 2018-04-12 DIAGNOSIS — M40292 Other kyphosis, cervical region: Secondary | ICD-10-CM | POA: Diagnosis not present

## 2018-04-12 DIAGNOSIS — M9903 Segmental and somatic dysfunction of lumbar region: Secondary | ICD-10-CM | POA: Diagnosis not present

## 2018-04-13 ENCOUNTER — Ambulatory Visit: Payer: Medicare Other | Admitting: Physical Therapy

## 2018-04-14 DIAGNOSIS — M9903 Segmental and somatic dysfunction of lumbar region: Secondary | ICD-10-CM | POA: Diagnosis not present

## 2018-04-14 DIAGNOSIS — M9901 Segmental and somatic dysfunction of cervical region: Secondary | ICD-10-CM | POA: Diagnosis not present

## 2018-04-14 DIAGNOSIS — M50322 Other cervical disc degeneration at C5-C6 level: Secondary | ICD-10-CM | POA: Diagnosis not present

## 2018-04-14 DIAGNOSIS — M40292 Other kyphosis, cervical region: Secondary | ICD-10-CM | POA: Diagnosis not present

## 2018-04-15 ENCOUNTER — Other Ambulatory Visit: Payer: Self-pay

## 2018-04-15 ENCOUNTER — Ambulatory Visit: Payer: Medicare Other | Admitting: Occupational Therapy

## 2018-04-15 ENCOUNTER — Ambulatory Visit: Payer: Medicare Other

## 2018-04-15 ENCOUNTER — Encounter: Payer: Self-pay | Admitting: Occupational Therapy

## 2018-04-15 DIAGNOSIS — R2689 Other abnormalities of gait and mobility: Secondary | ICD-10-CM | POA: Diagnosis not present

## 2018-04-15 DIAGNOSIS — M6281 Muscle weakness (generalized): Secondary | ICD-10-CM

## 2018-04-15 DIAGNOSIS — R278 Other lack of coordination: Secondary | ICD-10-CM

## 2018-04-15 DIAGNOSIS — R2681 Unsteadiness on feet: Secondary | ICD-10-CM

## 2018-04-15 DIAGNOSIS — R293 Abnormal posture: Secondary | ICD-10-CM

## 2018-04-15 NOTE — Patient Instructions (Signed)
Prone Knee Flexion - 5 reps - 2 sets - 1x daily - 3x weekly

## 2018-04-15 NOTE — Therapy (Signed)
[facility] [address] [place], [place], [address] Phone: [phone]   Fax:  [phone]  Occupational Therapy Evaluation  Patient Details  Name: Kayla [name] MRN: [id number] Date of Birth: Jun 23, 1950 No data recorded  Encounter Date: 04/15/2018  OT End of Session - 04/15/18 2027    Visit Number  1    Number of Visits  9    Date for OT Re-Evaluation  05/15/18    Authorization Type  Medicare, BCBS--covered 100%    Authorization Time Period  cert. 04/15/18-07/14/18    Authorization - Visit Number  1    Authorization - Number of Visits  10    OT Start Time  0160    OT Stop Time  1055    OT Time Calculation (min)  37 min    Activity Tolerance  Patient tolerated treatment well    Behavior During Therapy  WFL for tasks assessed/performed       Past Medical History:  Diagnosis Date  . Arthritis    oa  . Cervical cancer ([place]) 2005  . COPD (chronic obstructive pulmonary disease) ([place])   . Multiple sclerosis ([place])   . Osteoporosis   . Other diseases of vocal cords    vocal cords are not even  . Other voice and resonance disorders   . Pneumonia 2014, 1992  . Sleep apnea    no cpap used  . Unspecified hypothyroidism     Past Surgical History:  Procedure Laterality Date  . ABDOMINAL HYSTERECTOMY  2005   for cervical cancer, complete  . CONVERSION TO TOTAL HIP Right 09/18/2015   Procedure: CONVERSION OF RIGHT HEMI ARTHROPLASTY TO TOTAL HIP ARTHROPLASTY ACETABULAR REVISON ;  Surgeon: Paralee Cancel, MD;  [facility];  Service: Orthopedics;  Laterality: Right;  . EYE SURGERY Bilateral    lens for cataracts  . HIP ARTHROPLASTY Right 03/01/2013   Procedure: ARTHROPLASTY BIPOLAR HIP;  Surgeon: Mauri Pole, MD;  [facility];  Service: Orthopedics;  Laterality: Right;  . HIP CLOSED REDUCTION Right 03/12/2013   Procedure: CLOSED REDUCTION HIP;  Surgeon: Gearlean Alf, MD;  Location: [facility];  Service:  Orthopedics;  Laterality: Right;  . HIP CLOSED REDUCTION Right 03/19/2013   Procedure: CLOSED REDUCTION HIP;  Surgeon: Johnn Hai, MD;  Location: [facility];  Service: Orthopedics;  Laterality: Right;  . HIP CLOSED REDUCTION Right 11/10/2015   Procedure: CLOSED MANIPULATION HIP;  Surgeon: Rod Can, MD;  [facility];  Service: Orthopedics;  Laterality: Right;  . HIP CLOSED REDUCTION Right 06/05/2017   Procedure: CLOSED REDUCTION HIP;  Surgeon: [name], MD;  [facility];  Service: Orthopedics;  Laterality: Right;  . TOTAL HIP REVISION Right 06/06/2017   Procedure: TOTAL HIP REVISION;  Surgeon: [name], MD;  [facility];  Service: Orthopedics;  Laterality: Right;  [facility] Kitchen VESICOVAGINAL FISTULA CLOSURE W/ TAH  2005    There were no vitals filed for this visit.  Subjective Assessment - 04/15/18 1026    Pertinent History  MS    Limitations  MS.    PMH:  hx of R shoulder fx 11/2016, hypothyroidism, osteoporosis, hx of R hip dislocation/fx and multiple surgeries, hx of R achilles rupture, positional sleep apnea, hypotension, hx of cervical CA, arthritis, COPD    Patient Stated Goals  improve R shoulder strength/ROM, be able to return to working out at gym    Currently in Pain?  No/denies  [facility] OT Assessment - 04/15/18 1027      Assessment   Medical Diagnosis  MS    Onset Date/Surgical Date  --   04/09/18 OT referral   Hand Dominance  Right      Precautions   Precautions  Fall;Posterior Hip    Precaution Comments  No ankle precautions per Dr. Lynann Bologna. Pt stated she's on "lifetime" posterior R hip precautions.       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   Lives With  Spouse      Prior Function   Level of Independence   Independent with community mobility with device;Independent with basic ADLs    Vocation  Part time employment    Vocation Requirements  Typing, seated, flexible hours    Leisure  Play with grand daughter (1 y.o.)   yoga, curves--hasn't done in last 2 years     ADL   ADL comments  pt reports mod I with BADLs with adaptions (turning bra in front)   difficulty with overhead reaching with dominant RUE     IADL   Prior Level of Function Shopping  Husband performs grocery shopping    Prior Level of Function Light Housekeeping  Husband does most tasks.  Pt able to do laundry with assist for going down step, able to load dishwasher.    Prior Level of Function Meal Prep  Pt reports that she is doing simple cooking     Tree surgeon Status  History of falls    Mobility Status Comments  mod I with RW      Vision - History   Baseline Vision  Wears glasses all the time   more for reading     Cognition   Overall Cognitive Status  Within Functional Limits for tasks assessed      Posture/Postural Control   Posture/Postural Control  Postural limitations    Postural Limitations  Flexed trunk;Increased lumbar lordosis      Sensation   Additional Comments  intermittent numbness/tingling in feet, occasional cramping in hands      Coordination   Fine Motor Movements are Fluid and Coordinated  --    9 Hole Peg Test  Right;Left    Right 9 Hole Peg Test  28.75    Left 9 Hole Peg Test  26.35      AROM   Overall AROM   Deficits    Overall AROM Comments  LUE WFL, RUE shoulder flex 110*, 110* abduction, approx 90% IR, ER grossly WNL      Strength   Overall Strength  Deficits    Overall Strength Comments  LUE proximal strength grossly 5/5, RUE proximal strength grossly 4/5      Hand Function   Right Hand Grip (lbs)  25    Left Hand Grip (lbs)  27                      OT Education - 04/15/18 2024    Education Details  OT eval  results/POC    Person(s) Educated  Patient    Methods  Explanation  Comprehension  Verbalized understanding          OT Long Term Goals - 04/15/18 2057      OT LONG TERM GOAL #1   Title  Pt will be independent with HEP for RUE ROM/strength.--check goals 05/16/18    Status  New      OT LONG TERM GOAL #2   Title  Pt will be able to lift/retrieve 2lb object using at least 115* shoulder flex with RUE safely x3.    Status  New      OT LONG TERM GOAL #3   Title  Pt will verbalize understanding of appropriate community fitness activities and adaptions for activities prn.    Status  New      OT LONG TERM GOAL #4   Title  Pt will be able to demo at least 125* R shoulder flex without compensation for ADLs/IADLs.    Baseline  110*    Status  New      OT LONG TERM GOAL #5   Title  Pt will demo at least 120* R shoulder abduction for ADLs/IADLs.    Baseline  110*    Status  New            Plan - 04/15/18 2032    Clinical Impression Statement  Pt is a 68 y.o. female referred to occupational therapy with diagnosis of MS.   Pt with PMH that includes:  hypothyroidism, osteoporosis, hx of R hip dislocation/fx and multiple surgeries, hx of R achilles rupture, positional sleep apnea, hypotension, hx of cervical CA, arthritis, COPD.  Pt also reports hx of R shoulder fx s/p LOB when sitting.  Pt reports that she has not regained all ROM/strength since fx and other medical issues/surgeries.   Pt reports difficulty with overhead reaching with dominant RUE and would like to be able to return to community fitness activities.  Pt presents today with decr RUE strength, decr RUE ROM as well as decr coordination, decr balance/functional mobility.  Pt would benefit from occupational therapy to address RUE strength, RUE ROM, and dominant RUE functional use for ADLs/IADLs.      Occupational Profile and client history currently impacting functional performance  Pt is mod I with ADLs/IADLs.  However, pt  reports difficulty with overhead reaching tasks and would like to be able to return to community fitness activities.    Occupational performance deficits (Please refer to evaluation for details):  ADL's;IADL's;Leisure;Social Participation    Rehab Potential  Good    OT Frequency  2x / week    OT Duration  4 weeks   or 8 visits +eval   OT Treatment/Interventions  Self-care/ADL training;Therapeutic exercise;Patient/family education;Neuromuscular education;Moist Heat;Aquatic Therapy;Energy conservation;Therapist, nutritional;Therapeutic activities;Passive range of motion;Manual Therapy;DME and/or AE instruction;Cryotherapy    Plan  initiate HEP for RUE ROM and strength    Clinical Decision Making  Limited treatment options, no task modification necessary    Recommended Other Services  current with PT    Consulted and Agree with Plan of Care  Patient       Patient will benefit from skilled therapeutic intervention in order to improve the following deficits and impairments:  Decreased balance, Decreased endurance, Decreased mobility, Difficulty walking, Decreased range of motion, Decreased activity tolerance, Decreased coordination, Decreased knowledge of use of DME, Decreased strength, Impaired flexibility, Impaired UE functional use, Improper spinal/pelvic alignment  Visit Diagnosis: Muscle weakness (generalized)  Other lack of coordination  Unsteadiness on feet  Other abnormalities of gait and mobility  Abnormal posture    Problem List Patient Active Problem List   Diagnosis Date Noted  . Ankle pain, right 08/11/2017  . Hypotension 06/07/2017  . Failed total hip arthroplasty with dislocation ([place]) 06/05/2017  . Cough variant asthma vs vcd  related to [facility]  11/10/2016  . Positional sleep apnea 09/10/2016  . Periodic limb movements of sleep 09/10/2016  . OSA on CPAP 02/08/2016  . S/P revision right [facility] 09/18/2015  . Leg swelling 08/21/2014  . Acute URI 04/04/2014  . Health  care maintenance 12/16/2013  . Other and unspecified hyperlipidemia 11/04/2013  . 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 S/P hip hemiarthroplasty 03/15/2013  . Dislocation of hip prosthesis ([place]) 03/12/2013  . Hip dislocation, left ([place]) 03/12/2013  . Hip dislocation, right ([place]) 03/12/2013  . Urinary retention with incomplete bladder emptying 03/09/2013  . Dysphagia, pharyngoesophageal phase 03/09/2013  . Femoral neck fracture ([place]) 03/08/2013  . Unspecified constipation 02/26/2013  . Fractured femoral neck ([place]) 02/23/2013  . Hip fracture ([place]) 02/23/2013  . Excessive flatus 10/21/2012  . Cold feet 07/29/2012  . Hyponatremia 05/20/2012  . Calf pain 11/17/2011  . Dyspnea 12/06/2007  . Post-menopausal osteoporosis 09/17/2007  . Hypothyroidism 04/01/2007  . Multiple sclerosis ([place]) 04/01/2007  . Other diseases of vocal cords 04/01/2007    [facility] 04/15/2018, 9:08 PM  [facility] [address] [place], [place], [address] Phone: [phone]   Fax:  [phone]  Name: Kayla [name] MRN: [id number] Date of Birth: January 16, 1951   Vianne Bulls, OTR/L [facility] [address] [place], [place]  [address] [phone] phone [phone] 04/15/18 9:09 PM

## 2018-04-15 NOTE — Therapy (Signed)
[facility] [address] [place], [place], [address] Phone: [phone]   Fax:  [phone]  Physical Therapy Treatment  Patient Details  Name: Kayla [name] MRN: [id number] Date of Birth: 1950/07/26 Referring Provider (PT): Dr. Almedia Balls   Encounter Date: 04/15/2018  PT End of Session - 04/15/18 1303    Visit Number  10    Number of Visits  17    Date for PT Re-Evaluation  04/26/18    Authorization Type  Medicare primary and BCBS secondary    PT Start Time  [phone]   pt late   PT Stop Time  0931    PT Time Calculation (min)  38 min    Equipment Utilized During Treatment  --   S prn   Activity Tolerance  Patient tolerated treatment well    Behavior During Therapy  [facility] for tasks assessed/performed       Past Medical History:  Diagnosis Date  . Arthritis    oa  . Cervical cancer ([place]) 2005  . COPD (chronic obstructive pulmonary disease) ([place])   . Multiple sclerosis ([place])   . Osteoporosis   . Other diseases of vocal cords    vocal cords are not even  . Other voice and resonance disorders   . Pneumonia 2014, 1992  . Sleep apnea    no cpap used  . Unspecified hypothyroidism     Past Surgical History:  Procedure Laterality Date  . ABDOMINAL HYSTERECTOMY  2005   for cervical cancer, complete  . CONVERSION TO TOTAL HIP Right 09/18/2015   Procedure: CONVERSION OF RIGHT HEMI ARTHROPLASTY TO TOTAL HIP ARTHROPLASTY ACETABULAR REVISON ;  Surgeon: Paralee Cancel, MD;  [facility];  Service: Orthopedics;  Laterality: Right;  . EYE SURGERY Bilateral    lens for cataracts  . HIP ARTHROPLASTY Right 03/01/2013   Procedure: ARTHROPLASTY BIPOLAR HIP;  Surgeon: Mauri Pole, MD;  [facility];  Service: Orthopedics;  Laterality: Right;  . HIP CLOSED REDUCTION Right 03/12/2013   Procedure: CLOSED REDUCTION HIP;  Surgeon: Gearlean Alf, MD;  Location: [facility];  Service: Orthopedics;  Laterality: Right;  . HIP  CLOSED REDUCTION Right 03/19/2013   Procedure: CLOSED REDUCTION HIP;  Surgeon: Johnn Hai, MD;  Location: [facility];  Service: Orthopedics;  Laterality: Right;  . HIP CLOSED REDUCTION Right 11/10/2015   Procedure: CLOSED MANIPULATION HIP;  Surgeon: Rod Can, MD;  [facility];  Service: Orthopedics;  Laterality: Right;  . HIP CLOSED REDUCTION Right 06/05/2017   Procedure: CLOSED REDUCTION HIP;  Surgeon: [name], MD;  [facility];  Service: Orthopedics;  Laterality: Right;  . TOTAL HIP REVISION Right 06/06/2017   Procedure: TOTAL HIP REVISION;  Surgeon: [name], MD;  [facility];  Service: Orthopedics;  Laterality: Right;  Marland Kitchen VESICOVAGINAL FISTULA CLOSURE W/ TAH  2005    There were no vitals filed for this visit.  Subjective Assessment - 04/15/18 0855    Subjective  Pt denied falls since last visit. Pt has OT eval today. Pt reported R ankle is "swelling up" again, began while traveling to [place] and sitting in car. Pt does not have preference re: DME provider for AFO. Pt got new (taller) RW yesterday.     Pertinent History  MS, hypothyroidism, sleep apnea (no longer using CPAP per last sleep study), bladder dysfunction, COPD, asthma, arthritis, osteoporsis    Patient Stated Goals  Walk with my cane again and get R side  working again    Currently in Pain?  No/denies         [facility] PT Assessment - 04/15/18 0858      Observation/Other Assessments-Edema    Edema  Circumferential      Circumferential Edema   Circumferential - Right  24 cm at B malleolus    with pitting edema noted   Circumferential - Left   25 cm at B malleolus      Sensation   Additional Comments  Denied N/T in B ankles/feet        Therex: Prone Knee Flexion - 5 reps - 2 sets - 1x daily - 3x weekly  Cues and demo for technique and to improve eccentric control and to hips on mat. Added to HEP. No pain reported.            [facility] PT Treatment/Exercise - 04/15/18 0903       Ambulation/Gait   Ambulation/Gait  Yes    Ambulation/Gait Assistance  5: Supervision    Ambulation/Gait Assistance Details  PT assessed new RW height and pt noted to experience less trunk flexion and improved B genu recurvatum with RW height incr. to one notch above crease of wrist in static standing.     Ambulation Distance (Feet)  115 Feet   x3   Assistive device  Rolling walker    Gait Pattern  Step-through pattern;Decreased stride length;Decreased dorsiflexion - left;Decreased dorsiflexion - right;Right genu recurvatum;Left genu recurvatum;Trunk flexed;Narrow base of support    Ambulation Surface  Level;Indoor      Standardized Balance Assessment   Standardized Balance Assessment  Berg Balance Test      Berg Balance Test   Sit to Stand  Able to stand without using hands and stabilize independently    Standing Unsupported  Able to stand safely 2 minutes    Sitting with Back Unsupported but Feet Supported on Floor or Stool  Able to sit safely and securely 2 minutes    Stand to Sit  Sits safely with minimal use of hands    Transfers  Able to transfer safely, minor use of hands    Standing Unsupported with Eyes Closed  Able to stand 10 seconds with supervision    Standing Ubsupported with Feet Together  Able to place feet together independently and stand for 1 minute with supervision    From Standing, Reach Forward with Outstretched Arm  Can reach confidently >25 cm (10")   RUE: 10"   From Standing Position, Pick up Object from Floor  Able to pick up shoe, needs supervision    From Standing Position, Turn to Look Behind Over each Shoulder  Looks behind one side only/other side shows less weight shift   can look over R shoulder > L shoulder   Turn 360 Degrees  Needs close supervision or verbal cueing    Standing Unsupported, Alternately Place Feet on Step/Stool  Able to complete >2 steps/needs minimal assist    Standing Unsupported, One Foot in Front  Able to take small step  independently and hold 30 seconds    Standing on One Leg  Able to lift leg independently and hold equal to or more than 3 seconds   L SLS for 3 sec. without support   Total Score  42             PT Education - 04/15/18 1301    Education Details  PT discussed process of setting up orthotic consult, pt did not have a  DME supplier in mind. PT educated pt on 10th visit progress note and BERG balance outcome measure result. PT provided pt with prone hamstring strengthening HEP. PT ensured new RW was at correct height.     Person(s) Educated  Patient    Methods  Explanation;Demonstration;Tactile cues;Verbal cues;Handout    Comprehension  Verbalized understanding;Returned demonstration       PT Short Term Goals - 03/25/18 1058      PT SHORT TERM GOAL #1   Title  Pt will be IND in HEP to improve deficits listed above. TARGET DATE FOR ALL STGS: 03/25/18    Status  Achieved      PT SHORT TERM GOAL #2   Title  Pt will improve TUG time with LRAD to </=13.5 sec. to decr. falls risk.     Status  Partially Met      PT SHORT TERM GOAL #3   Title  Pt will amb. 300' over even terrain, at MOD I level, with LRAD to improve functional mobility.     Status  Achieved      PT SHORT TERM GOAL #4   Title  Pt will improve gait speed to >/=2.19f/sec. with LRAD to decr. falls risk.     Status  Partially Met      PT SHORT TERM GOAL #5   Title  Pt will improve BERG score to >/=34/56 to decr. falls risk.     Status  Achieved        PT Long Term Goals - 02/25/18 1358      PT LONG TERM GOAL #1   Title  Pt will improve gait speed with LRAD to >/=2.664fsec to safely amb. in the community. TARGET DATE FOR ALL LTGS: 04/22/18    Status  New      PT LONG TERM GOAL #2   Title  Pt will improve BERG score to >/=38/56 to decr. falls risk.     Status  New      PT LONG TERM GOAL #3   Title  Pt will amb. 500' over even/uneven terrain with LRAD at MOD I level to improve functional mobility.     Status  New       PT LONG TERM GOAL #4   Title  Pt will amb. 100' with SPC, at MOD I Level, over even terrain to safely use SPC at home.    Status  New            Plan - 04/15/18 1303    Clinical Impression Statement  Pt demonstrated progress as her BERG balance score improved, indicated pt is now at significant risk for falls vs. high falls risk. Pt tolerated prone hamstring curls well but did require rest breaks 2/2 fatigue. Pt noted to experience reported tightness in B hip flexors (and hips came up from mat) during hamstring curls, therefore, PT will assess B hip flexor flexibility next session. Continue with POC.     Rehab Potential  Good    Clinical Impairments Affecting Rehab Potential  see above    PT Frequency  2x / week    PT Duration  8 weeks    PT Treatment/Interventions  ADLs/Self Care Home Management;Aquatic Therapy;Biofeedback;Canalith Repostioning;Electrical Stimulation;Therapeutic exercise;Therapeutic activities;Manual techniques;Vestibular;Functional mobility training;Orthotic Fit/Training;Stair training;Gait training;Patient/family education;DME Instruction;Neuromuscular re-education    PT Next Visit Plan  Check B hip flexor flexibility. balance reactions training; PT will send  [facility] orthotic consult with PT session    PT Home Exercise Plan  [id number]  Consulted and Agree with Plan of Care  Patient       Patient will benefit from skilled therapeutic intervention in order to improve the following deficits and impairments:  Abnormal gait, Decreased endurance, Impaired sensation, Decreased knowledge of use of DME, Decreased strength, Impaired tone, Impaired UE functional use, Decreased balance, Decreased mobility, Pain, Decreased range of motion, Postural dysfunction, Impaired flexibility, Decreased coordination(impaired tone per pt, none noted during exam, no pain during session-PT will not treat directly but will monitor closely)  Visit Diagnosis: Other abnormalities of gait and  mobility  Muscle weakness (generalized)  Other lack of coordination     Problem List Patient Active Problem List   Diagnosis Date Noted  . Ankle pain, right 08/11/2017  . Hypotension 06/07/2017  . Failed total hip arthroplasty with dislocation ([place]) 06/05/2017  . Cough variant asthma vs vcd  related to [facility]  11/10/2016  . Positional sleep apnea 09/10/2016  . Periodic limb movements of sleep 09/10/2016  . OSA on CPAP 02/08/2016  . S/P revision right [facility] 09/18/2015  . Leg swelling 08/21/2014  . Acute URI 04/04/2014  . Health care maintenance 12/16/2013  . Other and unspecified hyperlipidemia 11/04/2013  . 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 S/P hip hemiarthroplasty 03/15/2013  . Dislocation of hip prosthesis ([place]) 03/12/2013  . Hip dislocation, left ([place]) 03/12/2013  . Hip dislocation, right ([place]) 03/12/2013  . Urinary retention with incomplete bladder emptying 03/09/2013  . Dysphagia, pharyngoesophageal phase 03/09/2013  . Femoral neck fracture ([place]) 03/08/2013  . Unspecified constipation 02/26/2013  . Fractured femoral neck ([place]) 02/23/2013  . Hip fracture ([place]) 02/23/2013  . Excessive flatus 10/21/2012  . Cold feet 07/29/2012  . Hyponatremia 05/20/2012  . Calf pain 11/17/2011  . Dyspnea 12/06/2007  . Post-menopausal osteoporosis 09/17/2007  . Hypothyroidism 04/01/2007  . Multiple sclerosis ([place]) 04/01/2007  . Other diseases of vocal cords 04/01/2007    Anyla Israelson L 04/15/2018, 1:06 PM  [facility] [address] [place], [place], [address] Phone: [phone]   Fax:  [phone]  Name: Kayla [name] MRN: [id number] Date of Birth: 06-05-1950  Patient Active Problem List   Diagnosis Date Noted  . Ankle pain, right 08/11/2017  . Hypotension 06/07/2017  . Failed total hip arthroplasty with dislocation ([place]) 06/05/2017  . Cough variant asthma vs vcd  related to [facility]  11/10/2016  . Positional sleep apnea  09/10/2016  . Periodic limb movements of sleep 09/10/2016  . OSA on CPAP 02/08/2016  . S/P revision right [facility] 09/18/2015  . Leg swelling 08/21/2014  . Acute URI 04/04/2014  . Health care maintenance 12/16/2013  . Other and unspecified hyperlipidemia 11/04/2013  . 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 S/P hip hemiarthroplasty 03/15/2013  . Dislocation of hip prosthesis ([place]) 03/12/2013  . Hip dislocation, left ([place]) 03/12/2013  . Hip dislocation, right ([place]) 03/12/2013  . Urinary retention with incomplete bladder emptying 03/09/2013  . Dysphagia, pharyngoesophageal phase 03/09/2013  . Femoral neck fracture ([place]) 03/08/2013  . Unspecified constipation 02/26/2013  . Fractured femoral neck ([place]) 02/23/2013  . Hip fracture ([place]) 02/23/2013  . Excessive flatus 10/21/2012  . Cold feet 07/29/2012  . Hyponatremia 05/20/2012  . Calf pain 11/17/2011  . Dyspnea 12/06/2007  . Post-menopausal osteoporosis 09/17/2007  . Hypothyroidism 04/01/2007  . Multiple sclerosis ([place]) 04/01/2007  . Other diseases of vocal cords 04/01/2007   Progress Note Reporting Period 02/25/18 to 04/15/18  See note below for Objective Data and Assessment of Progress/Goals.  Geoffry Paradise, PT,DPT 04/15/18 1:09 PM Phone: [phone] Fax: [phone]

## 2018-04-20 ENCOUNTER — Ambulatory Visit: Payer: Medicare Other

## 2018-04-20 ENCOUNTER — Ambulatory Visit: Payer: Medicare Other | Admitting: Occupational Therapy

## 2018-04-20 DIAGNOSIS — R278 Other lack of coordination: Secondary | ICD-10-CM

## 2018-04-20 DIAGNOSIS — M50322 Other cervical disc degeneration at C5-C6 level: Secondary | ICD-10-CM | POA: Diagnosis not present

## 2018-04-20 DIAGNOSIS — M9901 Segmental and somatic dysfunction of cervical region: Secondary | ICD-10-CM | POA: Diagnosis not present

## 2018-04-20 DIAGNOSIS — R2689 Other abnormalities of gait and mobility: Secondary | ICD-10-CM | POA: Diagnosis not present

## 2018-04-20 DIAGNOSIS — M9903 Segmental and somatic dysfunction of lumbar region: Secondary | ICD-10-CM | POA: Diagnosis not present

## 2018-04-20 DIAGNOSIS — M6281 Muscle weakness (generalized): Secondary | ICD-10-CM

## 2018-04-20 DIAGNOSIS — R2681 Unsteadiness on feet: Secondary | ICD-10-CM | POA: Diagnosis not present

## 2018-04-20 DIAGNOSIS — M40292 Other kyphosis, cervical region: Secondary | ICD-10-CM | POA: Diagnosis not present

## 2018-04-20 DIAGNOSIS — R293 Abnormal posture: Secondary | ICD-10-CM | POA: Diagnosis not present

## 2018-04-20 NOTE — Therapy (Signed)
[facility] [address] [place], [place], [address] Phone: [phone]   Fax:  [phone]  Occupational Therapy Treatment  Patient Details  Name: Kayla [name] MRN: [id number] Date of Birth: July 17, 1950 No data recorded  Encounter Date: 04/20/2018  OT End of Session - 04/20/18 1108    Visit Number  2    Number of Visits  9    Date for OT Re-Evaluation  05/15/18    Authorization Type  Medicare, BCBS--covered 100%    Authorization Time Period  cert. 04/15/18-07/14/18    Authorization - Visit Number  2    Authorization - Number of Visits  10    OT Start Time  1106    OT Stop Time  1145    OT Time Calculation (min)  39 min       Past Medical History:  Diagnosis Date  . Arthritis    oa  . Cervical cancer ([place]) 2005  . COPD (chronic obstructive pulmonary disease) ([place])   . Multiple sclerosis ([place])   . Osteoporosis   . Other diseases of vocal cords    vocal cords are not even  . Other voice and resonance disorders   . Pneumonia 2014, 1992  . Sleep apnea    no cpap used  . Unspecified hypothyroidism     Past Surgical History:  Procedure Laterality Date  . ABDOMINAL HYSTERECTOMY  2005   for cervical cancer, complete  . CONVERSION TO TOTAL HIP Right 09/18/2015   Procedure: CONVERSION OF RIGHT HEMI ARTHROPLASTY TO TOTAL HIP ARTHROPLASTY ACETABULAR REVISON ;  Surgeon: Paralee Cancel, MD;  [facility];  Service: Orthopedics;  Laterality: Right;  . EYE SURGERY Bilateral    lens for cataracts  . HIP ARTHROPLASTY Right 03/01/2013   Procedure: ARTHROPLASTY BIPOLAR HIP;  Surgeon: Mauri Pole, MD;  [facility];  Service: Orthopedics;  Laterality: Right;  . HIP CLOSED REDUCTION Right 03/12/2013   Procedure: CLOSED REDUCTION HIP;  Surgeon: Gearlean Alf, MD;  Location: [facility];  Service: Orthopedics;  Laterality: Right;  . HIP CLOSED REDUCTION Right 03/19/2013   Procedure: CLOSED REDUCTION HIP;  Surgeon: Johnn Hai, MD;  Location: [facility];  Service: Orthopedics;  Laterality: Right;  . HIP CLOSED REDUCTION Right 11/10/2015   Procedure: CLOSED MANIPULATION HIP;  Surgeon: Rod Can, MD;  [facility];  Service: Orthopedics;  Laterality: Right;  . HIP CLOSED REDUCTION Right 06/05/2017   Procedure: CLOSED REDUCTION HIP;  Surgeon: [name], MD;  [facility];  Service: Orthopedics;  Laterality: Right;  . TOTAL HIP REVISION Right 06/06/2017   Procedure: TOTAL HIP REVISION;  Surgeon: [name], MD;  [facility];  Service: Orthopedics;  Laterality: Right;  Marland Kitchen VESICOVAGINAL FISTULA CLOSURE W/ TAH  2005    There were no vitals filed for this visit.  Subjective Assessment - 04/20/18 1918    Limitations  MS.    PMH:  hx of R shoulder fx 11/2016, hypothyroidism, osteoporosis, hx of R hip dislocation/fx and multiple surgeries, hx of R achilles rupture, positional sleep apnea, hypotension, hx of cervical CA, arthritis, COPD    Patient Stated Goals  improve R shoulder strength/ROM, be able to return to working out at gym    Currently in Pain?  No/denies           Treatment: Supine closed chain chest press and shoulder flexion, min facilitation. Seated mid range closed chain shoulder flexion, min facilitation, Pt was instructed in  HEP, see pt instructions. Arm bike x 5 mins level 3 for conditioning. Seated and standing mid range functional reaching to place/ remove graded clothespins min v.c                OT Education - 04/20/18 1915    Education Details  yellow theraband HEP    Person(s) Educated  Patient    Methods  Explanation;Demonstration;Verbal cues;Handout    Comprehension  Verbalized understanding;Returned demonstration;Verbal cues required          OT Long Term Goals - 04/15/18 2057      OT LONG TERM GOAL #1   Title  Pt will be independent with HEP for RUE ROM/strength.--check goals 05/16/18    Status  New      OT LONG TERM GOAL #2   Title  Pt  will be able to lift/retrieve 2lb object using at least 115* shoulder flex with RUE safely x3.    Status  New      OT LONG TERM GOAL #3   Title  Pt will verbalize understanding of appropriate community fitness activities and adaptions for activities prn.    Status  New      OT LONG TERM GOAL #4   Title  Pt will be able to demo at least 125* R shoulder flex without compensation for ADLs/IADLs.    Baseline  110*    Status  New      OT LONG TERM GOAL #5   Title  Pt will demo at least 120* R shoulder abduction for ADLs/IADLs.    Baseline  110*    Status  New            Plan - 04/20/18 1915    Clinical Impression Statement  Pt is progressing towards goals, She returned demonstration of yellow theraband HEP.    Occupational Profile and client history currently impacting functional performance  Pt is mod I with ADLs/IADLs.  However, pt reports difficulty with overhead reaching tasks and would like to be able to return to community fitness activities.    Occupational performance deficits (Please refer to evaluation for details):  ADL's;IADL's;Leisure;Social Participation    Rehab Potential  Good    OT Frequency  2x / week    OT Duration  4 weeks    OT Treatment/Interventions  Self-care/ADL training;Therapeutic exercise;Patient/family education;Neuromuscular education;Moist Heat;Aquatic Therapy;Energy conservation;Therapist, nutritional;Therapeutic activities;Passive range of motion;Manual Therapy;DME and/or AE instruction;Cryotherapy    Plan  add to HEP, functional reaching    Consulted and Agree with Plan of Care  Patient       Patient will benefit from skilled therapeutic intervention in order to improve the following deficits and impairments:  Decreased balance, Decreased endurance, Decreased mobility, Difficulty walking, Decreased range of motion, Decreased activity tolerance, Decreased coordination, Decreased knowledge of use of DME, Decreased strength, Impaired flexibility,  Impaired UE functional use, Improper spinal/pelvic alignment  Visit Diagnosis: Muscle weakness (generalized)  Other lack of coordination  Unsteadiness on feet    Problem List Patient Active Problem List   Diagnosis Date Noted  . Ankle pain, right 08/11/2017  . Hypotension 06/07/2017  . Failed total hip arthroplasty with dislocation ([place]) 06/05/2017  . Cough variant asthma vs vcd  related to [facility]  11/10/2016  . Positional sleep apnea 09/10/2016  . Periodic limb movements of sleep 09/10/2016  . OSA on CPAP 02/08/2016  . S/P revision right [facility] 09/18/2015  . Leg swelling 08/21/2014  . Acute URI 04/04/2014  . Health care maintenance  12/16/2013  . Other and unspecified hyperlipidemia 11/04/2013  . 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 S/P hip hemiarthroplasty 03/15/2013  . Dislocation of hip prosthesis ([place]) 03/12/2013  . Hip dislocation, left ([place]) 03/12/2013  . Hip dislocation, right ([place]) 03/12/2013  . Urinary retention with incomplete bladder emptying 03/09/2013  . Dysphagia, pharyngoesophageal phase 03/09/2013  . Femoral neck fracture ([place]) 03/08/2013  . Unspecified constipation 02/26/2013  . Fractured femoral neck ([place]) 02/23/2013  . Hip fracture ([place]) 02/23/2013  . Excessive flatus 10/21/2012  . Cold feet 07/29/2012  . Hyponatremia 05/20/2012  . Calf pain 11/17/2011  . Dyspnea 12/06/2007  . Post-menopausal osteoporosis 09/17/2007  . Hypothyroidism 04/01/2007  . Multiple sclerosis ([place]) 04/01/2007  . Other diseases of vocal cords 04/01/2007    RINE,KATHRYN 04/20/2018, 7:18 PM  [facility] [address] [place], [place], [address] Phone: [phone]   Fax:  [phone]  Name: BECKIE VISCARDI MRN: [id number] Date of Birth: February 20, 1951

## 2018-04-20 NOTE — Patient Instructions (Signed)
  Strengthening: Resisted Flexion   Hold tubing with _____ arm(s) at side. Pull forward and up. Move shoulder through pain-free range of motion. Repeat __10__ times per set.  Do _1-2_ sessions per day , every other day   Strengthening: Resisted Extension- perform seated   Hold tubing in ___right__ hand(s), arm forward. Pull arm back, elbow straight. Repeat _10___ times per set. Do _1-2___ sessions per day, every other day.   Resisted Horizontal Abduction: Bilateral   Sit, tubing in both hands, arms out in front, keeping arms low. Keeping arms straight, pinch shoulder blades together and stretch arms out. Repeat _10___ times per set. Do _1-2___ sessions per day, every other day.   Elbow Flexion: Resisted   With tubing held in rigth ______ hand(s) and other end secured under foot, curl arm up as far as possible. Repeat _10___ times per set. Do _1-2___ sessions per day, every other day.    Elbow Extension: Resisted   Sit in chair with resistive band,  hold with left hand  ___right____ elbow bent. Straighten elbow. Repeat _10___ times per set.  Do _1-2___ sessions per day, every other day.   Copyright  VHI. All rights reserved.

## 2018-04-20 NOTE — Therapy (Addendum)
[facility] [address] [place], [place], [address] Phone: [phone]   Fax:  [phone]  Physical Therapy Treatment  Patient Details  Name: Kayla [name] MRN: [id number] Date of Birth: Jan 31, 1951 Referring Provider (PT): Dr. Almedia Balls   Encounter Date: 04/20/2018  PT End of Session - 04/20/18 1046    Visit Number  11    Number of Visits  17    Date for PT Re-Evaluation  04/26/18    Authorization Type  Medicare primary and BCBS secondary    PT Start Time  0935   pt late   PT Stop Time  1015    PT Time Calculation (min)  40 min    Equipment Utilized During Treatment  --   S prn   Activity Tolerance  Patient tolerated treatment well    Behavior During Therapy  [facility] for tasks assessed/performed       Past Medical History:  Diagnosis Date  . Arthritis    oa  . Cervical cancer ([place]) 2005  . COPD (chronic obstructive pulmonary disease) ([place])   . Multiple sclerosis ([place])   . Osteoporosis   . Other diseases of vocal cords    vocal cords are not even  . Other voice and resonance disorders   . Pneumonia 2014, 1992  . Sleep apnea    no cpap used  . Unspecified hypothyroidism     Past Surgical History:  Procedure Laterality Date  . ABDOMINAL HYSTERECTOMY  2005   for cervical cancer, complete  . CONVERSION TO TOTAL HIP Right 09/18/2015   Procedure: CONVERSION OF RIGHT HEMI ARTHROPLASTY TO TOTAL HIP ARTHROPLASTY ACETABULAR REVISON ;  Surgeon: Paralee Cancel, MD;  [facility];  Service: Orthopedics;  Laterality: Right;  . EYE SURGERY Bilateral    lens for cataracts  . HIP ARTHROPLASTY Right 03/01/2013   Procedure: ARTHROPLASTY BIPOLAR HIP;  Surgeon: Mauri Pole, MD;  [facility];  Service: Orthopedics;  Laterality: Right;  . HIP CLOSED REDUCTION Right 03/12/2013   Procedure: CLOSED REDUCTION HIP;  Surgeon: Gearlean Alf, MD;  Location: [facility];  Service: Orthopedics;  Laterality: Right;  . HIP  CLOSED REDUCTION Right 03/19/2013   Procedure: CLOSED REDUCTION HIP;  Surgeon: Johnn Hai, MD;  Location: [facility];  Service: Orthopedics;  Laterality: Right;  . HIP CLOSED REDUCTION Right 11/10/2015   Procedure: CLOSED MANIPULATION HIP;  Surgeon: Rod Can, MD;  [facility];  Service: Orthopedics;  Laterality: Right;  . HIP CLOSED REDUCTION Right 06/05/2017   Procedure: CLOSED REDUCTION HIP;  Surgeon: [name], MD;  [facility];  Service: Orthopedics;  Laterality: Right;  . TOTAL HIP REVISION Right 06/06/2017   Procedure: TOTAL HIP REVISION;  Surgeon: [name], MD;  [facility];  Service: Orthopedics;  Laterality: Right;  Marland Kitchen VESICOVAGINAL FISTULA CLOSURE W/ TAH  2005    There were no vitals filed for this visit.  Subjective Assessment - 04/20/18 0938    Subjective  Pt denied falls or changes since last visit. PT discussed transition to new primary PT, as current primary PT will be out on maternity leave soon.     Pertinent History  MS, hypothyroidism, sleep apnea (no longer using CPAP per last sleep study), bladder dysfunction, COPD, asthma, arthritis, osteoporsis    Patient Stated Goals  Walk with my cane again and get R side working again    Currently in Pain?  No/denies  [facility] PT Assessment - 04/20/18 0940      Circumferential Edema   Circumferential - Right  24.5 cm around B malleolus    Circumferential - Left   22.5 cm around B malleolus          Therex: Access Code: [email or web address]  URL: [email or web address]  Date: 04/20/2018  Prepared by: Geoffry Paradise   Exercises  Supine Hip Flexor Stretch NO Weight - 10 reps - 1-2 sets - 60-120 sec. hold - 2-3x daily - 7x weekly  Cues and demo for technique. Handout provided to pt. Also reviewed previous prone hamstring curl (B), provided last session. Pt denied pain.          [facility] Adult PT Treatment/Exercise - 04/20/18 0940      Ambulation/Gait   Ambulation/Gait  Yes     Ambulation/Gait Assistance  5: Supervision    Ambulation/Gait Assistance Details  Cues to improve weight shifting while traversing inclines/declines. Cues to improve B genu recurvatum, especially with fatigue.     Ambulation Distance (Feet)  500 Feet    Assistive device  Rolling walker    Gait Pattern  Step-through pattern;Decreased stride length;Decreased dorsiflexion - left;Decreased dorsiflexion - right;Right genu recurvatum;Left genu recurvatum;Trunk flexed;Narrow base of support    Ambulation Surface  Level;Unlevel;Indoor;Outdoor;Paved    Gait velocity  1.91 ft/sec with RW (17.16 sec. and 17.18 sec.)       Standardized Balance Assessment   Standardized Balance Assessment  Timed Up and Go Test      Timed Up and Go Test   TUG  Normal TUG    Normal TUG (seconds)  17.66   with RW and 16.21 with RW      Seated rest breaks after each balance and gait assessment 2/2 fatigue.      Self Care: PT Education - 04/20/18 1045    Education Details  PT discussed goal progress and outcome measure results, orthotic consult request faxed to [facility]. PT educated pt that Speech therapy order would be required from MD, if she's experiencing swallowing issues. PT discussed edema measurements and that L ankle edema is decr. but R ankle remains approx. the same (slightly incr. from last session) and to inform MD if anything changes.     Person(s) Educated  Patient    Methods  Explanation    Comprehension  Verbalized understanding       PT Short Term Goals - 03/25/18 1058      PT SHORT TERM GOAL #1   Title  Pt will be IND in HEP to improve deficits listed above. TARGET DATE FOR ALL STGS: 03/25/18    Status  Achieved      PT SHORT TERM GOAL #2   Title  Pt will improve TUG time with LRAD to </=13.5 sec. to decr. falls risk.     Status  Partially Met      PT SHORT TERM GOAL #3   Title  Pt will amb. 300' over even terrain, at MOD I level, with LRAD to improve functional mobility.     Status  Achieved       PT SHORT TERM GOAL #4   Title  Pt will improve gait speed to >/=2.76f/sec. with LRAD to decr. falls risk.     Status  Partially Met      PT SHORT TERM GOAL #5   Title  Pt will improve BERG score to >/=34/56 to decr. falls risk.     Status  Achieved  PT Long Term Goals - 04/20/18 1047      PT LONG TERM GOAL #1   Title  Pt will improve gait speed with LRAD to >/=2.16f/sec to safely amb. in the community. TARGET DATE FOR ALL LTGS: 04/22/18    Baseline  1.915fsec on 04/20/18    Status  Partially Met      PT LONG TERM GOAL #2   Title  Pt will improve BERG score to >/=46/56 to decr. falls risk.     Baseline  42/56 on 04/15/18    Status  Revised      PT LONG TERM GOAL #3   Title  Pt will amb. 500' over even/uneven terrain with LRAD at MOD I level to improve functional mobility.     Status  Partially Met      PT LONG TERM GOAL #4   Title  Pt will amb. 100' with SPC, at MOD I Level, over even terrain to safely use SPC at home.    Status  Deferred            Plan - 04/20/18 1009    Clinical Impression Statement  Pt demonstrated progress as she partially met LTGs 1 and 3. LTG 4 deferred at this time. Pt met LTG 2, therefore, PT will revise goal, as pt's BERG score still indicates pt is at risk for falls. Pt's TUG time also improve but continues to indicate pt is at falls risk. PT will assess HEP next session and then renew pt for 2x/week for 4 weeks. Continue with POC.     Rehab Potential  Good    Clinical Impairments Affecting Rehab Potential  see above    PT Frequency  2x / week    PT Duration  8 weeks    PT Treatment/Interventions  ADLs/Self Care Home Management;Aquatic Therapy;Biofeedback;Canalith Repostioning;Electrical Stimulation;Therapeutic exercise;Therapeutic activities;Manual techniques;Vestibular;Functional mobility training;Orthotic Fit/Training;Stair training;Gait training;Patient/family education;DME Instruction;Neuromuscular re-education    PT Next Visit  Plan  RENEW after Assessing HEP and progress as indicated and request SPEECH consult for swallowing (liquids and foods). balance reactions training; orthotic info sent to [facility] with pt's appt's-waiting to hear back     PT Home Exercise Plan  [email or web address]    Consulted and Agree with Plan of Care  Patient       Patient will benefit from skilled therapeutic intervention in order to improve the following deficits and impairments:  Abnormal gait, Decreased endurance, Impaired sensation, Decreased knowledge of use of DME, Decreased strength, Impaired tone, Impaired UE functional use, Decreased balance, Decreased mobility, Pain, Decreased range of motion, Postural dysfunction, Impaired flexibility, Decreased coordination(impaired tone per pt, none noted during exam, no pain during session-PT will not treat directly but will monitor closely)  Visit Diagnosis: Other abnormalities of gait and mobility  Unsteadiness on feet  Other lack of coordination  Muscle weakness (generalized)     Problem List Patient Active Problem List   Diagnosis Date Noted  . Ankle pain, right 08/11/2017  . Hypotension 06/07/2017  . Failed [place] with [place] ([facility]03/15/2019  . Cough variant asthma vs vcd  related to [facility]08/20/2018  . Positional sleep apnea 09/10/2016  . Periodic limb movements of sleep 09/10/2016  . OSA on CPAP 02/08/2016  . [facility]  . Leg swelling 08/21/2014  . Acute URI 04/04/2014  . Health care maintenance 12/16/2013  . Other and unspecified hyperlipidemia 11/04/2013  . 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 S/P hip hemiarthroplasty 03/15/2013  . Dislocation  of hip prosthesis ([place]) 03/12/2013  . Hip dislocation, left ([place]) 03/12/2013  . Hip dislocation, right ([place]) 03/12/2013  . Urinary retention with incomplete bladder emptying 03/09/2013  . Dysphagia, pharyngoesophageal phase 03/09/2013  . Femoral neck fracture ([place]) 03/08/2013  . Unspecified  constipation 02/26/2013  . Fractured femoral neck ([place]) 02/23/2013  . Hip fracture ([place]) 02/23/2013  . Excessive flatus 10/21/2012  . Cold feet 07/29/2012  . Hyponatremia 05/20/2012  . Calf pain 11/17/2011  . Dyspnea 12/06/2007  . Post-menopausal osteoporosis 09/17/2007  . Hypothyroidism 04/01/2007  . Multiple sclerosis ([place]) 04/01/2007  . Other diseases of vocal cords 04/01/2007    Miller,Jennifer L 04/20/2018, 11:19 AM  [facility] [address] [place], [place], [address] Phone: [phone]   Fax:  [phone]  Name: Kayla [name] MRN: [id number] Date of Birth: [birth year]  Geoffry Paradise, PT,DPT 04/20/18 11:20 AM Phone: [phone] Fax: [phone]

## 2018-04-20 NOTE — Patient Instructions (Signed)
Access Code: [email or web address]  URL: [email or web address]  Date: 04/20/2018  Prepared by: Geoffry Paradise   Exercises  Supine Hip Flexor Stretch NO Weight - 10 reps - 1-2 sets - 60-120 sec. hold - 2-3x daily - 7x weekly

## 2018-04-22 ENCOUNTER — Ambulatory Visit: Payer: Medicare Other

## 2018-04-22 DIAGNOSIS — R2689 Other abnormalities of gait and mobility: Secondary | ICD-10-CM

## 2018-04-22 DIAGNOSIS — R293 Abnormal posture: Secondary | ICD-10-CM | POA: Diagnosis not present

## 2018-04-22 DIAGNOSIS — R2681 Unsteadiness on feet: Secondary | ICD-10-CM

## 2018-04-22 DIAGNOSIS — R278 Other lack of coordination: Secondary | ICD-10-CM

## 2018-04-22 DIAGNOSIS — M6281 Muscle weakness (generalized): Secondary | ICD-10-CM

## 2018-04-22 NOTE — Patient Instructions (Addendum)
Access Code: [email or web address]  URL: [email or web address]  Date: 04/22/2018  Prepared by: Geoffry Paradise   Exercises  Sit to Stand - 10 reps - 2 sets - 1x daily - 5x weekly  Standing March with Counter Support - 10 reps - 3 sets - 1x daily - 3-4x weekly  Heel rises with counter support - 10 reps - 3 sets - 2 hold - 1x daily - 3-4x weekly  Romberg Stance with Head Nods - 10 reps - 1 sets - 1x daily - 5x weekly  Romberg Stance with Eyes Closed - 3 reps - 20 hold - 1x daily - 5x weekly  Clamshell - 10 reps - 3 sets - 1x daily - 3x weekly  Side Stepping with Counter Support - 4 reps - 1 sets - 1x daily - 5x weekly  Bent Knee Fallouts - 10 reps - 3 sets - 1x daily - 3x weekly  Bridge - 10 reps - 3 sets - 1x daily - 3x weekly  Standing Terminal Knee Extension with Resistance - 10 reps - 2-3 sets - 1-2 hold - 1x daily - 3x weekly (REVIEW) Prone Knee Flexion - 5 reps - 2 sets - 1x daily - 3x weekly  Supine Hip Flexor Stretch with Weight - 10 reps - 1-2 sets - 60-120 sec. hold - 2-3x daily - 7x weekly (REVIEW)

## 2018-04-22 NOTE — Therapy (Signed)
[facility] [address] [place], [place], [address] Phone: [phone]   Fax:  [phone]  Physical Therapy Treatment  Patient Details  Name: Kayla [name] MRN: [id number] Date of Birth: 05/16/1950 Referring Provider (PT): Dr. Almedia Balls   Encounter Date: 04/22/2018  PT End of Session - 04/22/18 1216    Visit Number  12    Number of Visits  17    Date for PT Re-Evaluation  04/26/18    Authorization Type  Medicare primary and BCBS secondary    PT Start Time  0852    PT Stop Time  0933    PT Time Calculation (min)  41 min    Equipment Utilized During Treatment  --   S prn   Activity Tolerance  Patient tolerated treatment well    Behavior During Therapy  [facility] for tasks assessed/performed       Past Medical History:  Diagnosis Date  . Arthritis    oa  . Cervical cancer ([place]) 2005  . COPD (chronic obstructive pulmonary disease) ([place])   . Multiple sclerosis ([place])   . Osteoporosis   . Other diseases of vocal cords    vocal cords are not even  . Other voice and resonance disorders   . Pneumonia 2014, 1992  . Sleep apnea    no cpap used  . Unspecified hypothyroidism     Past Surgical History:  Procedure Laterality Date  . ABDOMINAL HYSTERECTOMY  2005   for cervical cancer, complete  . CONVERSION TO TOTAL HIP Right 09/18/2015   Procedure: CONVERSION OF RIGHT HEMI ARTHROPLASTY TO TOTAL HIP ARTHROPLASTY ACETABULAR REVISON ;  Surgeon: Paralee Cancel, MD;  [facility]: [facility];  Service: Orthopedics;  Laterality: Right;  . EYE SURGERY Bilateral    lens for cataracts  . HIP ARTHROPLASTY Right 03/01/2013   Procedure: ARTHROPLASTY BIPOLAR HIP;  Surgeon: Mauri Pole, MD;  [facility];  Service: Orthopedics;  Laterality: Right;  . HIP CLOSED REDUCTION Right 03/12/2013   Procedure: CLOSED REDUCTION HIP;  Surgeon: Gearlean Alf, MD;  Location: [facility];  Service: Orthopedics;  Laterality: Right;  . HIP CLOSED  REDUCTION Right 03/19/2013   Procedure: CLOSED REDUCTION HIP;  Surgeon: Johnn Hai, MD;  Location: [facility];  Service: Orthopedics;  Laterality: Right;  . HIP CLOSED REDUCTION Right 11/10/2015   Procedure: CLOSED MANIPULATION HIP;  Surgeon: Rod Can, MD;  [facility];  Service: Orthopedics;  Laterality: Right;  . HIP CLOSED REDUCTION Right 06/05/2017   Procedure: CLOSED REDUCTION HIP;  Surgeon: [name], MD;  [facility];  Service: Orthopedics;  Laterality: Right;  . TOTAL HIP REVISION Right 06/06/2017   Procedure: TOTAL HIP REVISION;  Surgeon: [name], MD;  [facility];  Service: Orthopedics;  Laterality: Right;  Marland Kitchen VESICOVAGINAL FISTULA CLOSURE W/ TAH  2005    There were no vitals filed for this visit.  Subjective Assessment - 04/22/18 0855    Subjective  Pt denied falls or changes since last visit. Pt reported she's been going to the chiropractor (for back) twice/week and began around the same timeshe began PT. Pt reported she needs to find another surface to perform hip flexor stretch, as one sofa is too tall.     Pertinent History  MS, hypothyroidism, sleep apnea (no longer using CPAP per last sleep study), bladder dysfunction, COPD, asthma, arthritis, osteoporsis    Patient Stated Goals  Walk with my cane again and get R side working  again    Currently in Pain?  No/denies        Therex and Neuro re-ed: Access Code: [email or web address]  URL: [email or web address]  Date: 04/22/2018  Prepared by: Geoffry Paradise   Exercises  Sit to Stand - 10 reps - 2 sets - 1x daily - 5x weekly (NMR) Standing March with Counter Support - 10 reps - 3 sets - 1x daily - 3-4x weekly (THEREX) Heel rises with counter support - 10 reps - 3 sets - 2 hold - 1x daily - 3-4x weekly (THEREX) Romberg Stance with Head Nods - 10 reps - 1 sets - 1x daily - 5x weekly (NMR) Romberg Stance with Eyes Closed - 3 reps - 20 hold - 1x daily - 5x weekly (NMR) Clamshell - 10 reps - 3  sets - 1x daily - 3x weekly (THEREX) Side Stepping with Counter Support - 4 reps - 1 sets - 1x daily - 5x weekly (NMR) Bent Knee Fallouts - 10 reps - 3 sets - 1x daily - 3x weekly (THEREX) Bridge - 10 reps - 3 sets - 1x daily - 3x weekly (THEREX) Standing Terminal Knee Extension with Resistance - 10 reps - 2-3 sets - 1-2 hold - 1x daily - 3x weekly (REVIEW) Prone Knee Flexion - 5 reps - 2 sets - 1x daily - 3x weekly (THEREX) Supine Hip Flexor Stretch with Weight - 10 reps - 1-2 sets - 60-120 sec. hold - 2-3x daily - 7x weekly (REVIEW)   Cues and demo for technique. Pt denied pain during session. Pt required rest breaks to ensure proper technique.                       PT Education - 04/22/18 1215    Education Details  PT reviewed and progressed/modified HEP.     Person(s) Educated  Patient    Methods  Explanation;Demonstration;Verbal cues;Handout;Tactile cues    Comprehension  Returned demonstration;Verbalized understanding       PT Short Term Goals - 03/25/18 1058      PT SHORT TERM GOAL #1   Title  Pt will be IND in HEP to improve deficits listed above. TARGET DATE FOR ALL STGS: 03/25/18    Status  Achieved      PT SHORT TERM GOAL #2   Title  Pt will improve TUG time with LRAD to </=13.5 sec. to decr. falls risk.     Status  Partially Met      PT SHORT TERM GOAL #3   Title  Pt will amb. 300' over even terrain, at MOD I level, with LRAD to improve functional mobility.     Status  Achieved      PT SHORT TERM GOAL #4   Title  Pt will improve gait speed to >/=2.69f/sec. with LRAD to decr. falls risk.     Status  Partially Met      PT SHORT TERM GOAL #5   Title  Pt will improve BERG score to >/=34/56 to decr. falls risk.     Status  Achieved        PT Long Term Goals - 04/22/18 1236      PT LONG TERM GOAL #1   Title  Pt will improve gait speed with LRAD to >/=2.671fsec to safely amb. in the community. TARGET DATE FOR ALL LTGS: 04/22/18 (ALL UNMET LTGS  WILL BE CARRIED OVER TO NEW POC: 05/20/18)    Baseline  1.9115fec on 04/20/18    Status  Partially Met      PT LONG TERM GOAL #2   Title  Pt will improve BERG score to >/=46/56 to decr. falls risk.     Baseline  42/56 on 04/15/18    Status  Revised      PT LONG TERM GOAL #3   Title  Pt will amb. 500' over even/uneven terrain with LRAD at MOD I level to improve functional mobility.     Status  Partially Met      PT LONG TERM GOAL #4   Title  Pt will amb. 100' with SPC, at MOD I Level, over even terrain to safely use SPC at home.    Status  Deferred      PT LONG TERM GOAL #5   Title  Pt will be IND in progressed HEP to improve strength, endurance, balance, and flexibility.     Status  New            Plan - 04/22/18 0034    Clinical Impression Statement  Today's skilled session focused on reviewing and progressing HEP as tolerated. Pt continues to require cues to improve eccentric control and to take rest breaks to ensure proper technique vs. using momentum or performing incorrectly 2/2 muscular fatigue. Pt would continue to benefit from skilled PT to improve safety during functional mobility. Therefore, PT requesting additional 2x/week for 4 weeks.     Rehab Potential  Good    Clinical Impairments Affecting Rehab Potential  see above    PT Frequency  2x / week   original POC: 2x/week for 8 weeks   PT Duration  4 weeks    PT Treatment/Interventions  ADLs/Self Care Home Management;Aquatic Therapy;Biofeedback;Canalith Repostioning;Electrical Stimulation;Therapeutic exercise;Therapeutic activities;Manual techniques;Vestibular;Functional mobility training;Orthotic Fit/Training;Stair training;Gait training;Patient/family education;DME Instruction;Neuromuscular re-education    PT Next Visit Plan  B AFOs, trial Swedish knee cage? balance reactions training; orthotic info sent to [facility] with pt's appt's-waiting to hear back. PT sent request for Speech consult to neurologist.     PT Home  Exercise Plan  [email or web address]    Consulted and Agree with Plan of Care  Patient       Patient will benefit from skilled therapeutic intervention in order to improve the following deficits and impairments:  Abnormal gait, Decreased endurance, Impaired sensation, Decreased knowledge of use of DME, Decreased strength, Impaired tone, Impaired UE functional use, Decreased balance, Decreased mobility, Pain, Decreased range of motion, Postural dysfunction, Impaired flexibility, Decreased coordination(impaired tone per pt, none noted during exam, no pain during session-PT will not treat directly but will monitor closely)  Visit Diagnosis: Muscle weakness (generalized)  Other lack of coordination  Unsteadiness on feet  Other abnormalities of gait and mobility  Abnormal posture     Problem List Patient Active Problem List   Diagnosis Date Noted  . Ankle pain, right 08/11/2017  . Hypotension 06/07/2017  . 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 Cough variant asthma vs vcd  related to [facility]  11/10/2016  . Positional sleep apnea 09/10/2016  . Periodic limb movements of sleep 09/10/2016  . OSA on CPAP 02/08/2016  . S/P revision right [facility] 09/18/2015  . Leg swelling 08/21/2014  . Acute URI 04/04/2014  . Health care maintenance 12/16/2013  . Other and unspecified hyperlipidemia 11/04/2013  . 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 S/P hip hemiarthroplasty 03/15/2013  . Dislocation of hip prosthesis ([place]) 03/12/2013  . Hip dislocation, left ([place]) 03/12/2013  . Hip dislocation, right ([place]) 03/12/2013  . Urinary retention  with incomplete bladder emptying 03/09/2013  . Dysphagia, pharyngoesophageal phase 03/09/2013  . Femoral neck fracture ([name]) 03/08/2013  . Unspecified constipation 02/26/2013  . Fractured femoral neck ([place]) 02/23/2013  . Hip fracture ([place]) 02/23/2013  . Excessive flatus 10/21/2012  . Cold feet 07/29/2012  . Hyponatremia 05/20/2012  . Calf pain 11/17/2011  .  Dyspnea 12/06/2007  . Post-menopausal osteoporosis 09/17/2007  . Hypothyroidism 04/01/2007  . Multiple sclerosis ([place]) 04/01/2007  . Other diseases of vocal cords 04/01/2007    Makinley Muscato L 04/22/2018, 12:44 PM  [facility] [address] [place], [place], [address] Phone: [phone]   Fax:  [phone]  Name: AMARACHUKWU LAKATOS MRN: [id number] Date of Birth: Aug 14, 1950  Geoffry Paradise, PT,DPT 04/22/18 12:44 PM Phone: [phone] Fax: [phone]

## 2018-04-23 ENCOUNTER — Ambulatory Visit: Payer: Medicare Other | Admitting: Occupational Therapy

## 2018-04-23 DIAGNOSIS — R2681 Unsteadiness on feet: Secondary | ICD-10-CM | POA: Diagnosis not present

## 2018-04-23 DIAGNOSIS — R2689 Other abnormalities of gait and mobility: Secondary | ICD-10-CM

## 2018-04-23 DIAGNOSIS — M9901 Segmental and somatic dysfunction of cervical region: Secondary | ICD-10-CM | POA: Diagnosis not present

## 2018-04-23 DIAGNOSIS — M6281 Muscle weakness (generalized): Secondary | ICD-10-CM | POA: Diagnosis not present

## 2018-04-23 DIAGNOSIS — R278 Other lack of coordination: Secondary | ICD-10-CM | POA: Diagnosis not present

## 2018-04-23 DIAGNOSIS — R293 Abnormal posture: Secondary | ICD-10-CM | POA: Diagnosis not present

## 2018-04-23 DIAGNOSIS — M50322 Other cervical disc degeneration at C5-C6 level: Secondary | ICD-10-CM | POA: Diagnosis not present

## 2018-04-23 DIAGNOSIS — M9903 Segmental and somatic dysfunction of lumbar region: Secondary | ICD-10-CM | POA: Diagnosis not present

## 2018-04-23 DIAGNOSIS — M40292 Other kyphosis, cervical region: Secondary | ICD-10-CM | POA: Diagnosis not present

## 2018-04-23 NOTE — Therapy (Signed)
[facility] [address] [place], [place], [address] Phone: [phone]   Fax:  [phone]  Occupational Therapy Treatment  Patient Details  Name: Kayla [name] MRN: [id number] Date of Birth: April 04, 1950 No data recorded  Encounter Date: 04/23/2018  OT End of Session - 04/23/18 1021    Visit Number  3    Number of Visits  9    Date for OT Re-Evaluation  05/15/18    Authorization Type  Medicare, BCBS--covered 100%    Authorization Time Period  cert. 04/15/18-07/14/18    Authorization - Visit Number  3    Authorization - Number of Visits  10    OT Start Time  7517    OT Stop Time  1100    OT Time Calculation (min)  39 min    Activity Tolerance  Patient tolerated treatment well    Behavior During Therapy  Restless       Past Medical History:  Diagnosis Date  . Arthritis    oa  . Cervical cancer ([place]) 2005  . COPD (chronic obstructive pulmonary disease) ([place])   . Multiple sclerosis ([place])   . Osteoporosis   . Other diseases of vocal cords    vocal cords are not even  . Other voice and resonance disorders   . Pneumonia 2014, 1992  . Sleep apnea    no cpap used  . Unspecified hypothyroidism     Past Surgical History:  Procedure Laterality Date  . ABDOMINAL HYSTERECTOMY  2005   for cervical cancer, complete  . CONVERSION TO TOTAL HIP Right 09/18/2015   Procedure: CONVERSION OF RIGHT HEMI ARTHROPLASTY TO TOTAL HIP ARTHROPLASTY ACETABULAR REVISON ;  Surgeon: Paralee Cancel, MD;  [facility];  Service: Orthopedics;  Laterality: Right;  . EYE SURGERY Bilateral    lens for cataracts  . HIP ARTHROPLASTY Right 03/01/2013   Procedure: ARTHROPLASTY BIPOLAR HIP;  Surgeon: Mauri Pole, MD;  [facility];  Service: Orthopedics;  Laterality: Right;  . HIP CLOSED REDUCTION Right 03/12/2013   Procedure: CLOSED REDUCTION HIP;  Surgeon: Gearlean Alf, MD;  Location: [facility];  Service: Orthopedics;  Laterality: Right;   . HIP CLOSED REDUCTION Right 03/19/2013   Procedure: CLOSED REDUCTION HIP;  Surgeon: Johnn Hai, MD;  Location: [facility];  Service: Orthopedics;  Laterality: Right;  . HIP CLOSED REDUCTION Right 11/10/2015   Procedure: CLOSED MANIPULATION HIP;  Surgeon: Rod Can, MD;  [facility];  Service: Orthopedics;  Laterality: Right;  . HIP CLOSED REDUCTION Right 06/05/2017   Procedure: CLOSED REDUCTION HIP;  Surgeon: [name], MD;  [facility];  Service: Orthopedics;  Laterality: Right;  . TOTAL HIP REVISION Right 06/06/2017   Procedure: TOTAL HIP REVISION;  Surgeon: [name], MD;  [facility];  Service: Orthopedics;  Laterality: Right;  Marland Kitchen VESICOVAGINAL FISTULA CLOSURE W/ TAH  2005    There were no vitals filed for this visit.  Subjective Assessment - 04/23/18 1057    Subjective   Denies pain    Limitations  MS.    PMH:  hx of R shoulder fx 11/2016, hypothyroidism, osteoporosis, hx of R hip dislocation/fx and multiple surgeries, hx of R achilles rupture, positional sleep apnea, hypotension, hx of cervical CA, arthritis, COPD    Patient Stated Goals  improve R shoulder strength/ROM, be able to return to working out at gym    Currently in Pain?  No/denies  Treatment:Pt was instructed in cane exercises for gentle stretch, min v.c, 10 reps each- see pt instructions.Reveiwed yellow theraband exercises 10 reps each, min v.c initally for positioning then pt returned demonstration. Arm bike x 6 mins level 3 for conditioning, min v..c speed, position        OT Education - 04/23/18 1058    Education Details  Cane exercises, reviewed yellow theraband    Person(s) Educated  Patient    Methods  Explanation;Demonstration;Verbal cues;Handout    Comprehension  Verbalized understanding;Returned demonstration;Verbal cues required          OT Long Term Goals - 04/15/18 2057      OT LONG TERM GOAL #1   Title  Pt will be independent with  HEP for RUE ROM/strength.--check goals 05/16/18    Status  New      OT LONG TERM GOAL #2   Title  Pt will be able to lift/retrieve 2lb object using at least 115* shoulder flex with RUE safely x3.    Status  New      OT LONG TERM GOAL #3   Title  Pt will verbalize understanding of appropriate community fitness activities and adaptions for activities prn.    Status  New      OT LONG TERM GOAL #4   Title  Pt will be able to demo at least 125* R shoulder flex without compensation for ADLs/IADLs.    Baseline  110*    Status  New      OT LONG TERM GOAL #5   Title  Pt will demo at least 120* R shoulder abduction for ADLs/IADLs.    Baseline  110*    Status  New            Plan - 04/23/18 1608    Clinical Impression Statement  Pt is progressing towards goals, She returned demonstration of cane exercises and yellow theraband HEP.    Occupational Profile and client history currently impacting functional performance  Pt is mod I with ADLs/IADLs.  However, pt reports difficulty with overhead reaching tasks and would like to be able to return to community fitness activities.    Occupational performance deficits (Please refer to evaluation for details):  ADL's;IADL's;Leisure;Social Participation    Rehab Potential  Good    OT Frequency  2x / week    OT Duration  4 weeks    OT Treatment/Interventions  Self-care/ADL training;Therapeutic exercise;Patient/family education;Neuromuscular education;Moist Heat;Aquatic Therapy;Energy conservation;Therapist, nutritional;Therapeutic activities;Passive range of motion;Manual Therapy;DME and/or AE instruction;Cryotherapy    Plan  review cane ex, functional reaching    Consulted and Agree with Plan of Care  Patient       Patient will benefit from skilled therapeutic intervention in order to improve the following deficits and impairments:  Decreased balance, Decreased endurance, Decreased mobility, Difficulty walking, Decreased range of motion,  Decreased activity tolerance, Decreased coordination, Decreased knowledge of use of DME, Decreased strength, Impaired flexibility, Impaired UE functional use, Improper spinal/pelvic alignment  Visit Diagnosis: Muscle weakness (generalized)  Other lack of coordination  Unsteadiness on feet  Other abnormalities of gait and mobility    Problem List Patient Active Problem List   Diagnosis Date Noted  . Ankle pain, right 08/11/2017  . Hypotension 06/07/2017  . Failed total hip arthroplasty with dislocation ([place]) 06/05/2017  . Cough variant asthma vs vcd  related to [facility]  11/10/2016  . Positional sleep apnea 09/10/2016  . Periodic limb movements of sleep 09/10/2016  . OSA on CPAP 02/08/2016  .  S/P revision right [facility] 09/18/2015  . Leg swelling 08/21/2014  . Acute URI 04/04/2014  . Health care maintenance 12/16/2013  . Other and unspecified hyperlipidemia 11/04/2013  . 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 S/P hip hemiarthroplasty 03/15/2013  . Dislocation of hip prosthesis ([place]) 03/12/2013  . Hip dislocation, left ([place]) 03/12/2013  . Hip dislocation, right ([place]) 03/12/2013  . Urinary retention with incomplete bladder emptying 03/09/2013  . Dysphagia, pharyngoesophageal phase 03/09/2013  . Femoral neck fracture ([place]) 03/08/2013  . Unspecified constipation 02/26/2013  . Fractured femoral neck ([place]) 02/23/2013  . Hip fracture ([place]) 02/23/2013  . Excessive flatus 10/21/2012  . Cold feet 07/29/2012  . Hyponatremia 05/20/2012  . Calf pain 11/17/2011  . Dyspnea 12/06/2007  . Post-menopausal osteoporosis 09/17/2007  . Hypothyroidism 04/01/2007  . Multiple sclerosis ([place]) 04/01/2007  . Other diseases of vocal cords 04/01/2007    Mirayah Wren 04/23/2018, 4:08 PM Theone Murdoch, OTR/L [phone] Phone: [phone] 4:09 PM 01/31/20[facility] [address] [place], [place], [address] Phone: [phone]   Fax:   [phone]  Name: Kayla [name] MRN: [id number] Date of Birth: [birth year]

## 2018-04-23 NOTE — Patient Instructions (Signed)
   Lie on back holding wand. Raise arms over head. Hold 5sec. Repeat 10 times per set.  Do 1-2 sessions per day.   ROM: Abduction - Wand   Seated Holding wand with left hand palm up, push wand directly out to side, leading with other hand palm down, until stretch is felt. Hold 5 seconds. Repeat 10 times per set. Do 1-2 sessions per day   ROM: Extension - Wand (Seated)   Sit holding wand behind back. Raise arms as far as possible. Repeat 10 times per set.  Do 1-2 sessions per day.   Press-Up With Wand   Press wand up until elbows are straight, then reach wand over head to a pain free range. Hold 5 seconds. Repeat 10 times. Do 1-2 sessions per day.     Active Assistive Shoulder External Rotation    SIT with stick at waist level,right  palm up, other palm down. Step to side, push forearm out from body with hand palm down, and keep elbows bent. Hold. Side step and return to start position. Perform 10 reps. 2X DAY   Cane Overhead - Sitting   With arms straight, hold cane forward at waist. Raise cane above head. Hold 3 seconds. Repeat 10 times. Do 2 times per day.

## 2018-04-27 ENCOUNTER — Encounter: Payer: Self-pay | Admitting: Rehabilitation

## 2018-04-27 ENCOUNTER — Ambulatory Visit: Payer: Medicare Other | Attending: Orthopedic Surgery | Admitting: Rehabilitation

## 2018-04-27 DIAGNOSIS — R2681 Unsteadiness on feet: Secondary | ICD-10-CM | POA: Insufficient documentation

## 2018-04-27 DIAGNOSIS — R2689 Other abnormalities of gait and mobility: Secondary | ICD-10-CM | POA: Insufficient documentation

## 2018-04-27 DIAGNOSIS — R278 Other lack of coordination: Secondary | ICD-10-CM | POA: Insufficient documentation

## 2018-04-27 DIAGNOSIS — M6281 Muscle weakness (generalized): Secondary | ICD-10-CM | POA: Insufficient documentation

## 2018-04-27 DIAGNOSIS — R293 Abnormal posture: Secondary | ICD-10-CM | POA: Diagnosis not present

## 2018-04-27 NOTE — Therapy (Signed)
[facility] [address] [place], [place], [address] Phone: [phone]   Fax:  [phone]  Physical Therapy Treatment  Patient Details  Name: Kayla [name] MRN: [id number] Date of Birth: October 23, 1950 Referring Provider (PT): Dr. Almedia Balls   Encounter Date: 04/27/2018  PT End of Session - 04/27/18 0952    Visit Number  13    Number of Visits  25   per updated POC   Date for PT Re-Evaluation  05/24/18   per updated POC   Authorization Type  Medicare primary and BCBS secondary    PT Start Time  0851    PT Stop Time  0933    PT Time Calculation (min)  42 min    Equipment Utilized During Treatment  --   S prn   Activity Tolerance  Patient tolerated treatment well    Behavior During Therapy  [facility] for tasks assessed/performed       Past Medical History:  Diagnosis Date  . Arthritis    oa  . Cervical cancer ([place]) 2005  . COPD (chronic obstructive pulmonary disease) ([place])   . Multiple sclerosis ([place])   . Osteoporosis   . Other diseases of vocal cords    vocal cords are not even  . Other voice and resonance disorders   . Pneumonia 2014, 1992  . Sleep apnea    no cpap used  . Unspecified hypothyroidism     Past Surgical History:  Procedure Laterality Date  . ABDOMINAL HYSTERECTOMY  2005   for cervical cancer, complete  . CONVERSION TO TOTAL HIP Right 09/18/2015   Procedure: CONVERSION OF RIGHT HEMI ARTHROPLASTY TO TOTAL HIP ARTHROPLASTY ACETABULAR REVISON ;  Surgeon: Paralee Cancel, MD;  Location: [facility];  Service: Orthopedics;  Laterality: Right;  . EYE SURGERY Bilateral    lens for cataracts  . HIP ARTHROPLASTY Right 03/01/2013   Procedure: ARTHROPLASTY BIPOLAR HIP;  Surgeon: Mauri Pole, MD;  [facility];  Service: Orthopedics;  Laterality: Right;  . HIP CLOSED REDUCTION Right 03/12/2013   Procedure: CLOSED REDUCTION HIP;  Surgeon: Gearlean Alf, MD;  Location: [facility];  Service: Orthopedics;   Laterality: Right;  . HIP CLOSED REDUCTION Right 03/19/2013   Procedure: CLOSED REDUCTION HIP;  Surgeon: Johnn Hai, MD;  Location: [facility];  Service: Orthopedics;  Laterality: Right;  . HIP CLOSED REDUCTION Right 11/10/2015   Procedure: CLOSED MANIPULATION HIP;  Surgeon: Rod Can, MD;  [facility];  Service: Orthopedics;  Laterality: Right;  . HIP CLOSED REDUCTION Right 06/05/2017   Procedure: CLOSED REDUCTION HIP;  Surgeon: [name], MD;  [facility];  Service: Orthopedics;  Laterality: Right;  . TOTAL HIP REVISION Right 06/06/2017   Procedure: TOTAL HIP REVISION;  Surgeon: [name], MD;  [facility];  Service: Orthopedics;  Laterality: Right;  [facility] VESICOVAGINAL FISTULA CLOSURE W/ TAH  2005    There were no vitals filed for this visit.  Subjective Assessment - 04/27/18 0852    Subjective  Pt reports no changes, but continues to have swelling in both ankles this morning.     Pertinent History  MS, hypothyroidism, sleep apnea (no longer using CPAP per last sleep study), bladder dysfunction, COPD, asthma, arthritis, osteoporsis    Patient Stated Goals  Walk with my cane again and get R side working again    Currently in Pain?  No/denies  [facility] Adult PT Treatment/Exercise - 04/27/18 0850      Ambulation/Gait   Ambulation/Gait  Yes    Ambulation/Gait Assistance  5: Supervision    Ambulation/Gait Assistance Details  Trialed different bracing options today during session to have better idea of what may be appropriate at [place] with foot clearance/positioning and knee control to prevent hyperextension.  First trialed R reaction ottobock AFO, however due to medial strut, caused increased RLE internal rotation.  Removed and then tried R allard/toe off AFO. This really did not make a difference at ankle nor did it help rotation and also did not prevent any hyperextension.  Felt that B foot clearance may be good enough to only do  knee brace/cage.  Tried swedish knee cage on R LE (clinic only has one).  This made marked difference in R knee control and provided good stability at knee to prevent knee recurvatum.  Pt okay with wearing, just reports it feels "weird."  Educated on purpose of knee control and need to prevent constant genu recurvatum.  Also note that L knee recrvatum not as forceful and only happens at very end of stance phase of gait on L.  Pt is able to improve control when really concentrating on knee control.  Then had pt ambulate at countertop to simulate use of cane.  Pt with improved L knee control when support is on L side vs R side.  Pt verbalized agreement.  Pt then did state that she has a leg length descrepancy with LLE being shorter and that she has very small wedge in L shoe (<1/4") therefore added larger wedge (between 1/4 and a 1/2") and pt reports she feels much more level, therefore maintained wedge.  Also note mild improvement in L knee control during gait.      Ambulation Distance (Feet)  15 Feet   with reaction, 115' with allard, 230' w/ knee cage/wedge   Assistive device  Rolling walker    Gait Pattern  Step-through pattern;Decreased stride length;Decreased dorsiflexion - left;Decreased dorsiflexion - right;Right genu recurvatum;Left genu recurvatum;Trunk flexed;Narrow base of support    Ambulation Surface  Level;Indoor    Gait Comments  Gait along counter top as mentioned above x 6 laps with either R or L support.       Self-Care   Self-Care  Other Self-Care Comments    Other Self-Care Comments   Educated on purpose of AFO/knee cage and need to protect knees/improve knee control for improved alignment/muscle activation during gait/standing.  Due to L knee hyperextension not being as forceful with somewhat better control when focusing, feel that she may be able to get by with a hinged knee brace on LLE crossing straps in back to prevent hyperextension.  Showed pt what PT was thinking about from  [place], but still would like to get input from orthotist.  Pt verbalized understanding.              PT Education - 04/27/18 [id number]    Education Details  see self care     Person(s) Educated  Patient    Methods  Explanation    Comprehension  Verbalized understanding       PT Short Term Goals - 03/25/18 1058      PT SHORT TERM GOAL #1   Title  Pt will be IND in HEP to improve deficits listed above. TARGET DATE FOR ALL STGS: 03/25/18    Status  Achieved      PT SHORT TERM GOAL #2  Title  Pt will improve TUG time with LRAD to </=13.5 sec. to decr. falls risk.     Status  Partially Met      PT SHORT TERM GOAL #3   Title  Pt will amb. 300' over even terrain, at MOD I level, with LRAD to improve functional mobility.     Status  Achieved      PT SHORT TERM GOAL #4   Title  Pt will improve gait speed to >/=2.45f/sec. with LRAD to decr. falls risk.     Status  Partially Met      PT SHORT TERM GOAL #5   Title  Pt will improve BERG score to >/=34/56 to decr. falls risk.     Status  Achieved        PT Long Term Goals - 04/22/18 1236      PT LONG TERM GOAL #1   Title  Pt will improve gait speed with LRAD to >/=2.652fsec to safely amb. in the community. TARGET DATE FOR ALL LTGS: 04/22/18 (ALL UNMET LTGS WILL BE CARRIED OVER TO NEW POC: 05/20/18)    Baseline  1.9133fec on 04/20/18    Status  Partially Met      PT LONG TERM GOAL #2   Title  Pt will improve BERG score to >/=46/56 to decr. falls risk.     Baseline  42/56 on 04/15/18    Status  Revised      PT LONG TERM GOAL #3   Title  Pt will amb. 500' over even/uneven terrain with LRAD at MOD I level to improve functional mobility.     Status  Partially Met      PT LONG TERM GOAL #4   Title  Pt will amb. 100' with SPC, at MOD I Level, over even terrain to safely use SPC at home.    Status  Deferred      PT LONG TERM GOAL #5   Title  Pt will be IND in progressed HEP to improve strength, endurance, balance, and flexibility.      Status  New            Plan - 04/27/18 [id number] Clinical Impression Statement  Skilled session focused on trialing varying AFOs during session.  None of the AFOs trialed during session provided enough control at foot/ankle nor at knee, therefore did try knee cage on RLE with marked improvement.  Pt able to self correct R LE IR with min cues.  Will have Chris from [facility] to future session for formal assessment.     Rehab Potential  Good    Clinical Impairments Affecting Rehab Potential  see above    PT Frequency  2x / week   original POC: 2x/week for 8 weeks   PT Duration  4 weeks    PT Treatment/Interventions  ADLs/Self Care Home Management;Aquatic Therapy;Biofeedback;Canalith Repostioning;Electrical Stimulation;Therapeutic exercise;Therapeutic activities;Manual techniques;Vestibular;Functional mobility training;Orthotic Fit/Training;Stair training;Gait training;Patient/family education;DME Instruction;Neuromuscular re-education    PT Next Visit Plan  balance reactions training; orthotic info sent to [facility] with pt's appt's-waiting to hear back. PT sent request for Speech consult to neurologist.     PT Home Exercise Plan  [email or web address]    Consulted and Agree with Plan of Care  Patient       Patient will benefit from skilled therapeutic intervention in order to improve the following deficits and impairments:  Abnormal gait, Decreased endurance, Impaired sensation, Decreased knowledge of use of DME, Decreased strength, Impaired tone, Impaired UE  functional use, Decreased balance, Decreased mobility, Pain, Decreased range of motion, Postural dysfunction, Impaired flexibility, Decreased coordination(impaired tone per pt, none noted during exam, no pain during session-PT will not treat directly but will monitor closely)  Visit Diagnosis: Muscle weakness (generalized)  Other lack of coordination  Unsteadiness on feet  Other abnormalities of gait and mobility  Abnormal  posture     Problem List Patient Active Problem List   Diagnosis Date Noted  . Ankle pain, right 08/11/2017  . Hypotension 06/07/2017  . Failed total hip arthroplasty with dislocation ([place]) 06/05/2017  . Cough variant asthma vs vcd  related to [facility]  11/10/2016  . Positional sleep apnea 09/10/2016  . Periodic limb movements of sleep 09/10/2016  . OSA on CPAP 02/08/2016  . S/P revision right [facility] 09/18/2015  . Leg swelling 08/21/2014  . Acute URI 04/04/2014  . Health care maintenance 12/16/2013  . Other and unspecified hyperlipidemia 11/04/2013  . 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 S/P hip hemiarthroplasty 03/15/2013  . Dislocation of hip prosthesis ([place]) 03/12/2013  . Hip dislocation, left ([place]) 03/12/2013  . Hip dislocation, right ([place]) 03/12/2013  . Urinary retention with incomplete bladder emptying 03/09/2013  . Dysphagia, pharyngoesophageal phase 03/09/2013  . Femoral neck fracture ([place]) 03/08/2013  . Unspecified constipation 02/26/2013  . Fractured femoral neck ([place]) 02/23/2013  . Hip fracture ([place]) 02/23/2013  . Excessive flatus 10/21/2012  . Cold feet 07/29/2012  . Hyponatremia 05/20/2012  . Calf pain 11/17/2011  . Dyspnea 12/06/2007  . Post-menopausal osteoporosis 09/17/2007  . Hypothyroidism 04/01/2007  . Multiple sclerosis ([place]) 04/01/2007  . Other diseases of vocal cords 04/01/2007    Cameron Sprang, PT, MPT [facility] [address] [place], [place], [address] Phone: [phone]   Fax:  [phone] 04/27/18, 9:57 AM  Name: Kayla [name] MRN: [id number] Date of Birth: 1950/12/18

## 2018-04-28 DIAGNOSIS — M50322 Other cervical disc degeneration at C5-C6 level: Secondary | ICD-10-CM | POA: Diagnosis not present

## 2018-04-28 DIAGNOSIS — M9901 Segmental and somatic dysfunction of cervical region: Secondary | ICD-10-CM | POA: Diagnosis not present

## 2018-04-28 DIAGNOSIS — M9903 Segmental and somatic dysfunction of lumbar region: Secondary | ICD-10-CM | POA: Diagnosis not present

## 2018-04-28 DIAGNOSIS — M40292 Other kyphosis, cervical region: Secondary | ICD-10-CM | POA: Diagnosis not present

## 2018-04-29 ENCOUNTER — Ambulatory Visit: Payer: Medicare Other | Admitting: Physical Therapy

## 2018-05-03 DIAGNOSIS — M50322 Other cervical disc degeneration at C5-C6 level: Secondary | ICD-10-CM | POA: Diagnosis not present

## 2018-05-03 DIAGNOSIS — M9903 Segmental and somatic dysfunction of lumbar region: Secondary | ICD-10-CM | POA: Diagnosis not present

## 2018-05-03 DIAGNOSIS — M40292 Other kyphosis, cervical region: Secondary | ICD-10-CM | POA: Diagnosis not present

## 2018-05-03 DIAGNOSIS — M9901 Segmental and somatic dysfunction of cervical region: Secondary | ICD-10-CM | POA: Diagnosis not present

## 2018-05-04 ENCOUNTER — Ambulatory Visit: Payer: Medicare Other | Admitting: Physical Therapy

## 2018-05-04 ENCOUNTER — Encounter: Payer: Medicare Other | Admitting: Occupational Therapy

## 2018-05-05 ENCOUNTER — Ambulatory Visit: Payer: Medicare Other | Admitting: Physical Therapy

## 2018-05-05 ENCOUNTER — Encounter: Payer: Self-pay | Admitting: Physical Therapy

## 2018-05-05 DIAGNOSIS — R293 Abnormal posture: Secondary | ICD-10-CM | POA: Diagnosis not present

## 2018-05-05 DIAGNOSIS — M50322 Other cervical disc degeneration at C5-C6 level: Secondary | ICD-10-CM | POA: Diagnosis not present

## 2018-05-05 DIAGNOSIS — R2689 Other abnormalities of gait and mobility: Secondary | ICD-10-CM

## 2018-05-05 DIAGNOSIS — R278 Other lack of coordination: Secondary | ICD-10-CM | POA: Diagnosis not present

## 2018-05-05 DIAGNOSIS — M6281 Muscle weakness (generalized): Secondary | ICD-10-CM | POA: Diagnosis not present

## 2018-05-05 DIAGNOSIS — M9901 Segmental and somatic dysfunction of cervical region: Secondary | ICD-10-CM | POA: Diagnosis not present

## 2018-05-05 DIAGNOSIS — R2681 Unsteadiness on feet: Secondary | ICD-10-CM | POA: Diagnosis not present

## 2018-05-05 DIAGNOSIS — M9903 Segmental and somatic dysfunction of lumbar region: Secondary | ICD-10-CM | POA: Diagnosis not present

## 2018-05-05 DIAGNOSIS — M40292 Other kyphosis, cervical region: Secondary | ICD-10-CM | POA: Diagnosis not present

## 2018-05-05 NOTE — Therapy (Signed)
[facility] [address] [place], [place], [address] Phone: [phone]   Fax:  [phone]  Physical Therapy Treatment  Patient Details  Name: Kayla [name] MRN: [id number] Date of Birth: January 21, 1951 Referring Provider (PT): Dr. Almedia Balls   Encounter Date: 05/05/2018  PT End of Session - 05/05/18 0809    Visit Number  14    Number of Visits  25   per updated POC   Date for PT Re-Evaluation  05/24/18   per updated POC   Authorization Type  Medicare primary and BCBS secondary    PT Start Time  0806    PT Stop Time  0845    PT Time Calculation (min)  39 min    Equipment Utilized During Treatment  Other (comment)   trial of orthotics to bil LE"s   Activity Tolerance  Patient tolerated treatment well;No increased pain    Behavior During Therapy  W[facility] for tasks assessed/performed       Past Medical History:  Diagnosis Date  . Arthritis    oa  . Cervical cancer ([place]) 2005  . COPD (chronic obstructive pulmonary disease) ([place])   . Multiple sclerosis ([place])   . Osteoporosis   . Other diseases of vocal cords    vocal cords are not even  . Other voice and resonance disorders   . Pneumonia 2014, 1992  . Sleep apnea    no cpap used  . Unspecified hypothyroidism     Past Surgical History:  Procedure Laterality Date  . ABDOMINAL HYSTERECTOMY  2005   for cervical cancer, complete  . CONVERSION TO TOTAL HIP Right 09/18/2015   Procedure: CONVERSION OF RIGHT HEMI ARTHROPLASTY TO TOTAL HIP ARTHROPLASTY ACETABULAR REVISON ;  Surgeon: Paralee Cancel, MD;  [facility];  Service: Orthopedics;  Laterality: Right;  . EYE SURGERY Bilateral    lens for cataracts  . HIP ARTHROPLASTY Right 03/01/2013   Procedure: ARTHROPLASTY BIPOLAR HIP;  Surgeon: Mauri Pole, MD;  [facility];  Service: Orthopedics;  Laterality: Right;  . HIP CLOSED REDUCTION Right 03/12/2013   Procedure: CLOSED REDUCTION HIP;  Surgeon: Gearlean Alf, MD;  Location: [facility];  Service: Orthopedics;  Laterality: Right;  . HIP CLOSED REDUCTION Right 03/19/2013   Procedure: CLOSED REDUCTION HIP;  Surgeon: Johnn Hai, MD;  Location: [facility];  Service: Orthopedics;  Laterality: Right;  . HIP CLOSED REDUCTION Right 11/10/2015   Procedure: CLOSED MANIPULATION HIP;  Surgeon: Rod Can, MD;  [facility];  Service: Orthopedics;  Laterality: Right;  . HIP CLOSED REDUCTION Right 06/05/2017   Procedure: CLOSED REDUCTION HIP;  Surgeon: [name], MD;  [facility];  Service: Orthopedics;  Laterality: Right;  . TOTAL HIP REVISION Right 06/06/2017   Procedure: TOTAL HIP REVISION;  Surgeon: [name], MD;  [facility];  Service: Orthopedics;  Laterality: Right;  [facility] VESICOVAGINAL FISTULA CLOSURE W/ TAH  2005    There were no vitals filed for this visit.  Subjective Assessment - 05/05/18 0807    Subjective  No new complants. No pain or falls to report.     Pertinent History  MS, hypothyroidism, sleep apnea (no longer using CPAP per last sleep study), bladder dysfunction, COPD, asthma, arthritis, osteoporsis    Patient Stated Goals  Walk with my cane again and get R side working again    Currently in Pain?  No/denies    Pain Score  0-No pain  [facility] Adult PT Treatment/Exercise - 05/05/18 0814      Transfers   Transfers  Sit to Stand;Stand to Sit    Sit to Stand  5: Supervision;With upper extremity assist;From chair/3-in-1;From bed    Stand to Sit  5: Supervision;With upper extremity assist;To bed;To chair/3-in-1      Ambulation/Gait   Ambulation/Gait  Yes    Ambulation/Gait Assistance  5: Supervision    Ambulation/Gait Assistance Details  cues for step length, incr quad contraction bil for improved knee control in stance.     Ambulation Distance (Feet)  30 Feet   x2, 115 x1   Assistive device  Rolling walker    Gait Pattern  Step-through pattern;Decreased stride length;Decreased dorsiflexion -  left;Decreased dorsiflexion - right;Right genu recurvatum;Left genu recurvatum;Trunk flexed;Narrow base of support    Ambulation Surface  Level;Indoor    Gait Comments  seen along side Dorothea Ogle from [facility] for trial of orthotics. donned swedish knee cage to right LE with good control noted at knee. trialed [place] Spry step on left LE. Pt not fully comfortable with how the spry step feels on her LE and reports she feels she has to work harder to advance the leg with swing phase.           Balance Exercises - 05/05/18 0837      Balance Exercises: Standing   Standing Eyes Closed  Wide (BOA);Head turns;Foam/compliant surface;Other reps (comment);30 secs;Limitations    Marching Limitations  on airex with UE support: alternating marching  10 reps each side, cues to keep "soft knees" with stance leg.    Heel Raises Limitations  on airex with UE support: cues for "soft knees" to prevent hyperextension along with cues for heel height lifting.       Balance Exercises: Standing   Standing Eyes Closed Limitations  on airex with feet close, no UE support: EC no head movements, progressing to EC head movements left<>right, up<>down. min guard to min assist for balance. cues on posture and abdominal bracing for balance assist. cues to maintain "soft knees in stance.           PT Short Term Goals - 03/25/18 1058      PT SHORT TERM GOAL #1   Title  Pt will be IND in HEP to improve deficits listed above. TARGET DATE FOR ALL STGS: 03/25/18    Status  Achieved      PT SHORT TERM GOAL #2   Title  Pt will improve TUG time with LRAD to </=13.5 sec. to decr. falls risk.     Status  Partially Met      PT SHORT TERM GOAL #3   Title  Pt will amb. 300' over even terrain, at MOD I level, with LRAD to improve functional mobility.     Status  Achieved      PT SHORT TERM GOAL #4   Title  Pt will improve gait speed to >/=2.21f/sec. with LRAD to decr. falls risk.     Status  Partially Met      PT SHORT TERM GOAL  #5   Title  Pt will improve BERG score to >/=34/56 to decr. falls risk.     Status  Achieved        PT Long Term Goals - 04/22/18 1236      PT LONG TERM GOAL #1   Title  Pt will improve gait speed with LRAD to >/=2.667fsec to safely amb. in the community. TARGET DATE FOR ALL LTGS: 04/22/18 (  ALL UNMET LTGS WILL BE CARRIED OVER TO NEW POC: 05/20/18)    Baseline  1.61f/sec on 04/20/18    Status  Partially Met      PT LONG TERM GOAL #2   Title  Pt will improve BERG score to >/=46/56 to decr. falls risk.     Baseline  42/56 on 04/15/18    Status  Revised      PT LONG TERM GOAL #3   Title  Pt will amb. 500' over even/uneven terrain with LRAD at MOD I level to improve functional mobility.     Status  Partially Met      PT LONG TERM GOAL #4   Title  Pt will amb. 100' with SPC, at MOD I Level, over even terrain to safely use SPC at home.    Status  Deferred      PT LONG TERM GOAL #5   Title  Pt will be IND in progressed HEP to improve strength, endurance, balance, and flexibility.     Status  New            Plan - 05/05/18 0810    Clinical Impression Statement  Today's skilled session inially focused on trial of swedish knee cage to right UE and AFO to left LE. Pt does like how the knee cage helps, howvever feels she works harder with the AFO on. At this time will order only the knee cage with goal to work on incr left knee strengthening/knee control. If toward end of plan of care no change is noted pt is agreeable to getting the AFO at that time. Remainder of session addressed LE strengthening and balance without any issues reported.     Rehab Potential  Good    Clinical Impairments Affecting Rehab Potential  see above    PT Frequency  2x / week   original POC: 2x/week for 8 weeks   PT Duration  4 weeks    PT Treatment/Interventions  ADLs/Self Care Home Management;Aquatic Therapy;Biofeedback;Canalith Repostioning;Electrical Stimulation;Therapeutic exercise;Therapeutic  activities;Manual techniques;Vestibular;Functional mobility training;Orthotic Fit/Training;Stair training;Gait training;Patient/family education;DME Instruction;Neuromuscular re-education    PT Next Visit Plan  balance reactions training; PT sent request for Speech consult to neurologist.     PT Home Exercise Plan  [email or web address]    Consulted and Agree with Plan of Care  Patient       Patient will benefit from skilled therapeutic intervention in order to improve the following deficits and impairments:  Abnormal gait, Decreased endurance, Impaired sensation, Decreased knowledge of use of DME, Decreased strength, Impaired tone, Impaired UE functional use, Decreased balance, Decreased mobility, Pain, Decreased range of motion, Postural dysfunction, Impaired flexibility, Decreased coordination(impaired tone per pt, none noted during exam, no pain during session-PT will not treat directly but will monitor closely)  Visit Diagnosis: Muscle weakness (generalized)  Unsteadiness on feet  Other abnormalities of gait and mobility     Problem List Patient Active Problem List   Diagnosis Date Noted  . Ankle pain, right 08/11/2017  . Hypotension 06/07/2017  . [place] with [place] ([place] 06/05/2017  . Cough [facility]cd  [facility]  . Positional sleep apnea 09/10/2016  . Periodic limb movements of sleep 09/10/2016  . OSA on CPAP 02/08/2016  . [facility]  . Leg swelling 08/21/2014  . Acute URI 04/04/2014  . Health care maintenance 12/16/2013  . Other and unspecified hyperlipidemia 11/04/2013  . 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 S/P hip hemiarthroplasty  03/15/2013  . Dislocation of hip prosthesis ([place]) 03/12/2013  . Hip dislocation, left ([place]) 03/12/2013  . Hip dislocation, right ([place]) 03/12/2013  . Urinary retention with incomplete bladder emptying 03/09/2013  . Dysphagia, pharyngoesophageal phase 03/09/2013  . Femoral neck  fracture ([place]) 03/08/2013  . Unspecified constipation 02/26/2013  . Fractured femoral neck ([place]) 02/23/2013  . Hip fracture ([place]) 02/23/2013  . Excessive flatus 10/21/2012  . Cold feet 07/29/2012  . Hyponatremia 05/20/2012  . Calf pain 11/17/2011  . Dyspnea 12/06/2007  . Post-menopausal osteoporosis 09/17/2007  . Hypothyroidism 04/01/2007  . Multiple sclerosis ([place]) 04/01/2007  . Other diseases of vocal cords 04/01/2007    Willow Ora, PTA, [facility] Outpatient Neuro [facility] [address] [place], [place] [address] [phone] 05/05/18, 3:21 PM   Name: Kayla [name] MRN: [id number] Date of Birth: 11-22-1950

## 2018-05-07 ENCOUNTER — Encounter: Payer: Self-pay | Admitting: Rehabilitation

## 2018-05-07 ENCOUNTER — Ambulatory Visit: Payer: Medicare Other | Admitting: Occupational Therapy

## 2018-05-07 ENCOUNTER — Ambulatory Visit: Payer: Medicare Other | Admitting: Rehabilitation

## 2018-05-07 DIAGNOSIS — R2689 Other abnormalities of gait and mobility: Secondary | ICD-10-CM

## 2018-05-07 DIAGNOSIS — M6281 Muscle weakness (generalized): Secondary | ICD-10-CM

## 2018-05-07 DIAGNOSIS — R2681 Unsteadiness on feet: Secondary | ICD-10-CM | POA: Diagnosis not present

## 2018-05-07 DIAGNOSIS — R278 Other lack of coordination: Secondary | ICD-10-CM

## 2018-05-07 DIAGNOSIS — R293 Abnormal posture: Secondary | ICD-10-CM | POA: Diagnosis not present

## 2018-05-07 NOTE — Therapy (Signed)
[facility] [address] [place], [place], [address] Phone: [phone]   Fax:  [phone]  Physical Therapy Treatment  Patient Details  Name: Kayla [name] MRN: [id number] Date of Birth: 05-May-1950 Referring Provider (PT): Dr. Almedia Balls   Encounter Date: 05/07/2018  PT End of Session - 05/07/18 0853    Visit Number  15    Number of Visits  25   per updated POC   Date for PT Re-Evaluation  05/24/18   per updated POC   Authorization Type  Medicare primary and BCBS secondary    PT Start Time  0849    PT Stop Time  0930    PT Time Calculation (min)  41 min    Equipment Utilized During Treatment  Other (comment)   trial of orthotics to bil LE"s   Activity Tolerance  Patient tolerated treatment well;No increased pain    Behavior During Therapy  WFL for tasks assessed/performed       Past Medical History:  Diagnosis Date  . Arthritis    oa  . Cervical cancer ([place]) 2005  . COPD (chronic obstructive pulmonary disease) ([place])   . Multiple sclerosis ([place])   . Osteoporosis   . Other diseases of vocal cords    vocal cords are not even  . Other voice and resonance disorders   . Pneumonia 2014, 1992  . Sleep apnea    no cpap used  . Unspecified hypothyroidism     Past Surgical History:  Procedure Laterality Date  . ABDOMINAL HYSTERECTOMY  2005   for cervical cancer, complete  . CONVERSION TO TOTAL HIP Right 09/18/2015   Procedure: CONVERSION OF RIGHT HEMI ARTHROPLASTY TO TOTAL HIP ARTHROPLASTY ACETABULAR REVISON ;  Surgeon: Paralee Cancel, MD;  Location: [facility];  Service: Orthopedics;  Laterality: Right;  . EYE SURGERY Bilateral    lens for cataracts  . HIP ARTHROPLASTY Right 03/01/2013   Procedure: ARTHROPLASTY BIPOLAR HIP;  Surgeon: Mauri Pole, MD;  [facility];  Service: Orthopedics;  Laterality: Right;  . HIP CLOSED REDUCTION Right 03/12/2013   Procedure: CLOSED REDUCTION HIP;  Surgeon: Gearlean Alf, MD;  Location: [facility];  Service: Orthopedics;  Laterality: Right;  . HIP CLOSED REDUCTION Right 03/19/2013   Procedure: CLOSED REDUCTION HIP;  Surgeon: Johnn Hai, MD;  Location: [facility];  Service: Orthopedics;  Laterality: Right;  . HIP CLOSED REDUCTION Right 11/10/2015   Procedure: CLOSED MANIPULATION HIP;  Surgeon: Rod Can, MD;  [facility];  Service: Orthopedics;  Laterality: Right;  . HIP CLOSED REDUCTION Right 06/05/2017   Procedure: CLOSED REDUCTION HIP;  Surgeon: [name], MD;  [facility];  Service: Orthopedics;  Laterality: Right;  . TOTAL HIP REVISION Right 06/06/2017   Procedure: TOTAL HIP REVISION;  Surgeon: [name], MD;  [facility];  Service: Orthopedics;  Laterality: Right;  [facility] Kitchen VESICOVAGINAL FISTULA CLOSURE W/ TAH  2005    There were no vitals filed for this visit.  Subjective Assessment - 05/07/18 0851    Subjective  Pt reports she is getting knee brace next week.     Pertinent History  MS, hypothyroidism, sleep apnea (no longer using CPAP per last sleep study), bladder dysfunction, COPD, asthma, arthritis, osteoporsis    Patient Stated Goals  Walk with my cane again and get R side working again    Currently in Pain?  No/denies  [facility] Adult PT Treatment/Exercise - 05/07/18 0918      Ambulation/Gait   Ambulation/Gait  Yes    Ambulation/Gait Assistance  5: Supervision    Ambulation/Gait Assistance Details  Ambulates with RW and R knee cage at S level. Note improved R foot alignment with knee cage vs without.  Also note improved control in L knee during gait.  Pt reports she is "think about it more."      Ambulation Distance (Feet)  115 Feet    Assistive device  Rolling walker    Gait Pattern  Step-through pattern;Decreased stride length;Decreased dorsiflexion - left;Decreased dorsiflexion - right;Right genu recurvatum;Left genu recurvatum;Trunk flexed;Narrow base of support    Ambulation  Surface  Level;Indoor    Gait Comments  Discussed use of quad tip cane if appropriate.  Will plan to try in future sessions.  Did try for very short distance today, however pt needs min A for safety.  Did NOT recommend this for home yet.       Neuro Re-ed    Neuro Re-ed Details   NMR for improved proximal hip control, knee control to carryover to improved gait.  With R swedish knee cage donned, forward walking in // bars with single bar to simulate walking with cane.  Also placed emphasis on forward hip protraction during stance and upright posture.  Pt did very well, but did note improved quality when using bar on the L side.  Also had pt stand on small rocker board (vertically) with L foot in center with forward/retro stepping RLE to encourage more forward weight shift over LLE with improved knee control.  Pt did very well but did need UE support througout.  did 10 reps on each side.  Supine NMR without knee cage: single LE bridging (figure 4) x 2 sets of 5 reps on each side-added to HEP (removed BLE bridging), prone knee flex x 10 reps on each side, prone hip extension x 5 reps on each side (was too difficult therefore did not add to HEP).  Tall kneeling with ball for support performing mini squats x 10 reps with emphasis on hip extension.  Progressed to half kneeling (with [facility] bench for increased support) alternating UE flex x 5 reps on each side then without UE support moving BUEs in diagonal patterns x 5 reps.  Pt had LOB when in L half kneeling and was assisted to mat safely.  No pain reported.  Ended with sit<>stand from mat with emphasis on RLE positioning.  Pt reports she uses block between legs at home to prevent adduction/IR of RLE, however note that this only encourages this motion as she is squeezing block.  Recommended that she stand from taller surface to better work on improved alignment.  Pt verbalized understanding.       Exercises   Other Exercises   B hip flex stretch off EOM x 2 mins  each.                PT Short Term Goals - 03/25/18 1058      PT SHORT TERM GOAL #1   Title  Pt will be IND in HEP to improve deficits listed above. TARGET DATE FOR ALL STGS: 03/25/18    Status  Achieved      PT SHORT TERM GOAL #2   Title  Pt will improve TUG time with LRAD to </=13.5 sec. to decr. falls risk.     Status  Partially Met      PT SHORT  TERM GOAL #3   Title  Pt will amb. 300' over even terrain, at MOD I level, with LRAD to improve functional mobility.     Status  Achieved      PT SHORT TERM GOAL #4   Title  Pt will improve gait speed to >/=2.14ft/sec. with LRAD to decr. falls risk.     Status  Partially Met      PT SHORT TERM GOAL #5   Title  Pt will improve BERG score to >/=34/56 to decr. falls risk.     Status  Achieved        PT Long Term Goals - 04/22/18 1236      PT LONG TERM GOAL #1   Title  Pt will improve gait speed with LRAD to >/=2.62ft/sec to safely amb. in the community. TARGET DATE FOR ALL LTGS: 04/22/18 (ALL UNMET LTGS WILL BE CARRIED OVER TO NEW POC: 05/20/18)    Baseline  1.91ft/sec on 04/20/18    Status  Partially Met      PT LONG TERM GOAL #2   Title  Pt will improve BERG score to >/=46/56 to decr. falls risk.     Baseline  42/56 on 04/15/18    Status  Revised      PT LONG TERM GOAL #3   Title  Pt will amb. 500' over even/uneven terrain with LRAD at MOD I level to improve functional mobility.     Status  Partially Met      PT LONG TERM GOAL #4   Title  Pt will amb. 100' with SPC, at MOD I Level, over even terrain to safely use SPC at home.    Status  Deferred      PT LONG TERM GOAL #5   Title  Pt will be IND in progressed HEP to improve strength, endurance, balance, and flexibility.     Status  New            Plan - 05/07/18 1342    Clinical Impression Statement  Skilled session focused on gait with use of R swedish knee cage with RW and in // bars to simulate use of cane.  Also worked on NMR for improved B hip protraction  in stance, improved R knee control and proximal hip stability.      Rehab Potential  Good    Clinical Impairments Affecting Rehab Potential  see above    PT Frequency  2x / week   original POC: 2x/week for 8 weeks   PT Duration  4 weeks    PT Treatment/Interventions  ADLs/Self Care Home Management;Aquatic Therapy;Biofeedback;Canalith Repostioning;Electrical Stimulation;Therapeutic exercise;Therapeutic activities;Manual techniques;Vestibular;Functional mobility training;Orthotic Fit/Training;Stair training;Gait training;Patient/family education;DME Instruction;Neuromuscular re-education    PT Next Visit Plan  balance reactions training; PT sent request for Speech consult to neurologist. BLE NMR (had difficulty with half kneeling)    PT Home Exercise Plan  [email or web address]    Consulted and Agree with Plan of Care  Patient       Patient will benefit from skilled therapeutic intervention in order to improve the following deficits and impairments:  Abnormal gait, Decreased endurance, Impaired sensation, Decreased knowledge of use of DME, Decreased strength, Impaired tone, Impaired UE functional use, Decreased balance, Decreased mobility, Pain, Decreased range of motion, Postural dysfunction, Impaired flexibility, Decreased coordination(impaired tone per pt, none noted during exam, no pain during session-PT will not treat directly but will monitor closely)  Visit Diagnosis: Muscle weakness (generalized)  Unsteadiness on feet  Other abnormalities of gait and   mobility     Problem List Patient Active Problem List   Diagnosis Date Noted  . Ankle pain, right 08/11/2017  . Hypotension 06/07/2017  . Failed total hip arthroplasty with dislocation ([place]) 06/05/2017  . Cough variant asthma vs vcd  related to [facility]  11/10/2016  . Positional sleep apnea 09/10/2016  . Periodic limb movements of sleep 09/10/2016  . OSA on CPAP 02/08/2016  . S/P revision right [facility] 09/18/2015  . Leg swelling 08/21/2014  . Acute  URI 04/04/2014  . Health care maintenance 12/16/2013  . Other and unspecified hyperlipidemia 11/04/2013  . 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 S/P hip hemiarthroplasty 03/15/2013  . Dislocation of hip prosthesis ([place]) 03/12/2013  . Hip dislocation, left ([place]) 03/12/2013  . Hip dislocation, right ([place]) 03/12/2013  . Urinary retention with incomplete bladder emptying 03/09/2013  . Dysphagia, pharyngoesophageal phase 03/09/2013  . Femoral neck fracture ([place]) 03/08/2013  . Unspecified constipation 02/26/2013  . Fractured femoral neck ([place]) 02/23/2013  . Hip fracture ([place]) 02/23/2013  . Excessive flatus 10/21/2012  . Cold feet 07/29/2012  . Hyponatremia 05/20/2012  . Calf pain 11/17/2011  . Dyspnea 12/06/2007  . Post-menopausal osteoporosis 09/17/2007  . Hypothyroidism 04/01/2007  . Multiple sclerosis ([place]) 04/01/2007  . Other diseases of vocal cords 04/01/2007    Cameron Sprang, PT, MPT [facility] [address] [place], [place], [address] Phone: [phone]   Fax:  [phone] 05/07/18, 1:44 PM  Name: Kayla [name] MRN: [id number] Date of Birth: 06-10-1950

## 2018-05-07 NOTE — Therapy (Signed)
[facility] [address] [place], [place], [address] Phone: [phone]   Fax:  [phone]  Occupational Therapy Treatment  Patient Details  Name: Kayla [name] MRN: [id number] Date of Birth: November 19, 1950 No data recorded  Encounter Date: 05/07/2018  OT End of Session - 05/07/18 0940    Visit Number  4    Number of Visits  9    Date for OT Re-Evaluation  05/15/18    Authorization Type  Medicare, BCBS--covered 100%    Authorization Time Period  cert. 04/15/18-07/14/18    Authorization - Visit Number  4    Authorization - Number of Visits  10    OT Start Time  0935    OT Stop Time  7209    OT Time Calculation (min)  40 min       Past Medical History:  Diagnosis Date  . Arthritis    oa  . Cervical cancer ([place]) 2005  . COPD (chronic obstructive pulmonary disease) ([place])   . Multiple sclerosis ([place])   . Osteoporosis   . Other diseases of vocal cords    vocal cords are not even  . Other voice and resonance disorders   . Pneumonia 2014, 1992  . Sleep apnea    no cpap used  . Unspecified hypothyroidism     Past Surgical History:  Procedure Laterality Date  . ABDOMINAL HYSTERECTOMY  2005   for cervical cancer, complete  . CONVERSION TO TOTAL HIP Right 09/18/2015   Procedure: CONVERSION OF RIGHT HEMI ARTHROPLASTY TO TOTAL HIP ARTHROPLASTY ACETABULAR REVISON ;  Surgeon: Paralee Cancel, MD;  [facility];  Service: Orthopedics;  Laterality: Right;  . EYE SURGERY Bilateral    lens for cataracts  . HIP ARTHROPLASTY Right 03/01/2013   Procedure: ARTHROPLASTY BIPOLAR HIP;  Surgeon: Mauri Pole, MD;  [facility];  Service: Orthopedics;  Laterality: Right;  . HIP CLOSED REDUCTION Right 03/12/2013   Procedure: CLOSED REDUCTION HIP;  Surgeon: Gearlean Alf, MD;  Location: [facility];  Service: Orthopedics;  Laterality: Right;  . HIP CLOSED REDUCTION Right 03/19/2013   Procedure: CLOSED REDUCTION HIP;  Surgeon: Johnn Hai, MD;  Location: [facility];  Service: Orthopedics;  Laterality: Right;  . HIP CLOSED REDUCTION Right 11/10/2015   Procedure: CLOSED MANIPULATION HIP;  Surgeon: Rod Can, MD;  [facility];  Service: Orthopedics;  Laterality: Right;  . HIP CLOSED REDUCTION Right 06/05/2017   Procedure: CLOSED REDUCTION HIP;  Surgeon: [name], MD;  [facility];  Service: Orthopedics;  Laterality: Right;  . TOTAL HIP REVISION Right 06/06/2017   Procedure: TOTAL HIP REVISION;  Surgeon: [name], MD;  [facility];  Service: Orthopedics;  Laterality: Right;  Marland Kitchen VESICOVAGINAL FISTULA CLOSURE W/ TAH  2005    There were no vitals filed for this visit.  Subjective Assessment - 05/07/18 1020    Subjective   Pt reports she was supposed to be out of town, thats why she wasn't seen    Limitations  MS.    PMH:  hx of R shoulder fx 11/2016, hypothyroidism, osteoporosis, hx of R hip dislocation/fx and multiple surgeries, hx of R achilles rupture, positional sleep apnea, hypotension, hx of cervical CA, arthritis, COPD    Patient Stated Goals  improve R shoulder strength/ROM, be able to return to working out at gym    Currently in Pain?  No/denies  Treatment:Reviewed cane exercises: supine closed chain stretch followed by seated shoulder abduction, internal /external rotation, shoulder extension, min v.c Standing facing mat rolling ball forwards and back for gentle stretch, followed by modified cat/ cow and rocking forwards and backwards, 10 reps each, min facillitation, wall slides x 10 reps Functional mid-high range reach to copy small peg design on vertical surface, min v.c Arm bike x 6 mins level 3 for conditioning                   OT Long Term Goals - 05/07/18 1020      OT LONG TERM GOAL #1   Title  Pt will be independent with HEP for RUE ROM/strength.--check goals 05/16/18    Status  Achieved      OT LONG TERM GOAL #2   Title  Pt will be able to  lift/retrieve 2lb object using at least 115* shoulder flex with RUE safely x3.    Status  On-going      OT LONG TERM GOAL #3   Title  Pt will verbalize understanding of appropriate community fitness activities and adaptions for activities prn.    Status  On-going      OT LONG TERM GOAL #4   Title  Pt will be able to demo at least 125* R shoulder flex without compensation for ADLs/IADLs.    Baseline  110*    Status  On-going      OT LONG TERM GOAL #5   Title  Pt will demo at least 120* R shoulder abduction for ADLs/IADLs.    Baseline  110*    Status  On-going            Plan - 05/07/18 0940    Clinical Impression Statement  Pt is progressing towards goals with improving ROM and functional reach.    Occupational Profile and client history currently impacting functional performance  Pt is mod I with ADLs/IADLs.  However, pt reports difficulty with overhead reaching tasks and would like to be able to return to community fitness activities.    Occupational performance deficits (Please refer to evaluation for details):  ADL's;IADL's;Leisure;Social Participation    Rehab Potential  Good    OT Frequency  2x / week    OT Duration  4 weeks    OT Treatment/Interventions  Self-care/ADL training;Therapeutic exercise;Patient/family education;Neuromuscular education;Moist Heat;Aquatic Therapy;Energy conservation;Therapist, nutritional;Therapeutic activities;Passive range of motion;Manual Therapy;DME and/or AE instruction;Cryotherapy    Plan  check short term goals, continue to address RUE functional reach    Consulted and Agree with Plan of Care  Patient       Patient will benefit from skilled therapeutic intervention in order to improve the following deficits and impairments:  Decreased balance, Decreased endurance, Decreased mobility, Difficulty walking, Decreased range of motion, Decreased activity tolerance, Decreased coordination, Decreased knowledge of use of DME, Decreased  strength, Impaired flexibility, Impaired UE functional use, Improper spinal/pelvic alignment  Visit Diagnosis: Muscle weakness (generalized)  Unsteadiness on feet  Other abnormalities of gait and mobility  Other lack of coordination  Abnormal posture    Problem List Patient Active Problem List   Diagnosis Date Noted  . Ankle pain, right 08/11/2017  . Hypotension 06/07/2017  . Failed total hip arthroplasty with dislocation ([place]) 06/05/2017  . Cough variant asthma vs vcd  related to [facility]  11/10/2016  . Positional sleep apnea 09/10/2016  . Periodic limb movements of sleep 09/10/2016  . OSA on CPAP 02/08/2016  . S/P revision right TH  09/18/2015  . Leg swelling 08/21/2014  . Acute URI 04/04/2014  . Health care maintenance 12/16/2013  . Other and unspecified hyperlipidemia 11/04/2013  . 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 S/P hip hemiarthroplasty 03/15/2013  . Dislocation of hip prosthesis ([place]) 03/12/2013  . Hip dislocation, left ([place]) 03/12/2013  . Hip dislocation, right ([place]) 03/12/2013  . Urinary retention with incomplete bladder emptying 03/09/2013  . Dysphagia, pharyngoesophageal phase 03/09/2013  . Femoral neck fracture ([place]) 03/08/2013  . Unspecified constipation 02/26/2013  . Fractured femoral neck ([place]) 02/23/2013  . Hip fracture ([place]) 02/23/2013  . Excessive flatus 10/21/2012  . Cold feet 07/29/2012  . Hyponatremia 05/20/2012  . Calf pain 11/17/2011  . Dyspnea 12/06/2007  . Post-menopausal osteoporosis 09/17/2007  . Hypothyroidism 04/01/2007  . Multiple sclerosis ([place]) 04/01/2007  . Other diseases of vocal cords 04/01/2007    Brieanna Nau 05/07/2018, 10:23 AM Theone Murdoch, OTR/L [phone] Phone: [phone] 10:23 AM 05/07/18 [facility] [address] [place], [place], [address] Phone: [phone]   Fax:  [phone]  Name: Kayla [name] MRN: [id number] Date of Birth: [birth year]

## 2018-05-07 NOTE — Patient Instructions (Signed)
Access Code: [email or web address]  URL: [email or web address]  Date: 05/07/2018  Prepared by: Cameron Sprang   Exercises  Sit to Stand - 10 reps - 2 sets - 1x daily - 5x weekly  Standing March with Counter Support - 10 reps - 3 sets - 1x daily - 3-4x weekly  Heel rises with counter support - 10 reps - 3 sets - 2 hold - 1x daily - 3-4x weekly  Romberg Stance with Head Nods - 10 reps - 1 sets - 1x daily - 5x weekly  Romberg Stance with Eyes Closed - 3 reps - 20 hold - 1x daily - 5x weekly  Clamshell - 10 reps - 3 sets - 1x daily - 3x weekly  Side Stepping with Counter Support - 4 reps - 1 sets - 1x daily - 5x weekly  Bent Knee Fallouts - 10 reps - 3 sets - 1x daily - 3x weekly  Standing Terminal Knee Extension with Resistance - 10 reps - 2-3 sets - 1-2 hold - 1x daily - 3x weekly  Prone Knee Flexion - 5 reps - 2 sets - 1x daily - 3x weekly  Supine Hip Flexor Stretch with Weight - 10 reps - 1-2 sets - 60-120 sec. hold - 2-3x daily - 7x weekly  Figure 4 Bridge - 5 reps - 2 sets - 1-2x daily - 3-4x weekly

## 2018-05-10 ENCOUNTER — Encounter: Payer: Self-pay | Admitting: Physical Therapy

## 2018-05-10 ENCOUNTER — Ambulatory Visit: Payer: Medicare Other | Admitting: Physical Therapy

## 2018-05-10 DIAGNOSIS — R293 Abnormal posture: Secondary | ICD-10-CM | POA: Diagnosis not present

## 2018-05-10 DIAGNOSIS — R2681 Unsteadiness on feet: Secondary | ICD-10-CM

## 2018-05-10 DIAGNOSIS — R2689 Other abnormalities of gait and mobility: Secondary | ICD-10-CM | POA: Diagnosis not present

## 2018-05-10 DIAGNOSIS — M6281 Muscle weakness (generalized): Secondary | ICD-10-CM | POA: Diagnosis not present

## 2018-05-10 DIAGNOSIS — R278 Other lack of coordination: Secondary | ICD-10-CM | POA: Diagnosis not present

## 2018-05-10 NOTE — Therapy (Signed)
[facility] [address] [place], [place], [address] Phone: [phone]   Fax:  [phone]  Physical Therapy Treatment  Patient Details  Name: ATHINA FAHEY MRN: [id number] Date of Birth: 01/01/1951 Referring Provider (PT): Dr. Almedia Balls   Encounter Date: 05/10/2018  PT End of Session - 05/10/18 1455    Visit Number  16    Number of Visits  25   per updated POC   Date for PT Re-Evaluation  05/24/18   per updated POC   Authorization Type  Medicare primary and BCBS secondary    PT Start Time  1449    PT Stop Time  1529    PT Time Calculation (min)  40 min    Equipment Utilized During Treatment  --    Activity Tolerance  Patient tolerated treatment well;No increased pain    Behavior During Therapy  WFL for tasks assessed/performed       Past Medical History:  Diagnosis Date  . Arthritis    oa  . Cervical cancer ([place]) 2005  . COPD (chronic obstructive pulmonary disease) ([place])   . Multiple sclerosis ([place])   . Osteoporosis   . Other diseases of vocal cords    vocal cords are not even  . Other voice and resonance disorders   . Pneumonia 2014, 1992  . Sleep apnea    no cpap used  . Unspecified hypothyroidism     Past Surgical History:  Procedure Laterality Date  . ABDOMINAL HYSTERECTOMY  2005   for cervical cancer, complete  . CONVERSION TO TOTAL HIP Right 09/18/2015   Procedure: CONVERSION OF RIGHT HEMI ARTHROPLASTY TO TOTAL HIP ARTHROPLASTY ACETABULAR REVISON ;  Surgeon: Paralee Cancel, MD;  Location: [facility];  Service: Orthopedics;  Laterality: Right;  . EYE SURGERY Bilateral    lens for cataracts  . HIP ARTHROPLASTY Right 03/01/2013   Procedure: ARTHROPLASTY BIPOLAR HIP;  Surgeon: Mauri Pole, MD;  [facility];  Service: Orthopedics;  Laterality: Right;  . HIP CLOSED REDUCTION Right 03/12/2013   Procedure: CLOSED REDUCTION HIP;  Surgeon: Gearlean Alf, MD;  Location: [facility];  Service:  Orthopedics;  Laterality: Right;  . HIP CLOSED REDUCTION Right 03/19/2013   Procedure: CLOSED REDUCTION HIP;  Surgeon: Johnn Hai, MD;  Location: [facility];  Service: Orthopedics;  Laterality: Right;  . HIP CLOSED REDUCTION Right 11/10/2015   Procedure: CLOSED MANIPULATION HIP;  Surgeon: Rod Can, MD;  [facility];  Service: Orthopedics;  Laterality: Right;  . HIP CLOSED REDUCTION Right 06/05/2017   Procedure: CLOSED REDUCTION HIP;  Surgeon: [name], MD;  [facility];  Service: Orthopedics;  Laterality: Right;  . TOTAL HIP REVISION Right 06/06/2017   Procedure: TOTAL HIP REVISION;  Surgeon: [name], MD;  [facility];  Service: Orthopedics;  Laterality: Right;  [facility] VESICOVAGINAL FISTULA CLOSURE W/ TAH  2005    There were no vitals filed for this visit.  Subjective Assessment - 05/10/18 1453    Subjective  No falls. Does report feeling off balance today, unsure as to why. Having trouble with the figure 4 bridge.     Pertinent History  MS, hypothyroidism, sleep apnea (no longer using CPAP per last sleep study), bladder dysfunction, COPD, asthma, arthritis, osteoporsis    Patient Stated Goals  Walk with my cane again and get R side working again    Currently in Pain?  No/denies    Pain Score  0-No pain  [facility] Adult PT Treatment/Exercise - 05/10/18 1502      Neuro Re-ed    Neuro Re-ed Details   for strengthening/improved proximal hip control: in tall kneeling with hands on red pball- mini squats x10 reps with emphasis on equal LE weight bearing and full return to tall kneeling x 10 reps, then rolling the ball out/in on forearms for modified plank position x 10 reps. min assist needed with roll outs for control and return to tall kneeling.         Knee/Hip Exercises: Supine   Bridges Limitations  figure 4 bridge x 10 reps each side. cues on technique/ex form.     Single Leg Bridge  AROM;Strengthening;Both;1 set;10 reps;Limitations   see bridge  comments     Knee/Hip Exercises: Prone   Hamstring Curl  2 sets;10 reps;Limitations    Hamstring Curl Limitations  assist needed for control and form, 1# ankle weight use on left LE.     Hip Extension  AROM;AAROM;Strengthening;Both;10 reps;Limitations;2 sets    Hip Extension Limitations  assistance needed to maintain knee extension, for lift and control. >assist needed with left LE.     Other Prone Exercises  glut kick backs 2 sets of 10 reps AAROM            PT Short Term Goals - 03/25/18 1058      PT SHORT TERM GOAL #1   Title  Pt will be IND in HEP to improve deficits listed above. TARGET DATE FOR ALL STGS: 03/25/18    Status  Achieved      PT SHORT TERM GOAL #2   Title  Pt will improve TUG time with LRAD to </=13.5 sec. to decr. falls risk.     Status  Partially Met      PT SHORT TERM GOAL #3   Title  Pt will amb. 300' over even terrain, at MOD I level, with LRAD to improve functional mobility.     Status  Achieved      PT SHORT TERM GOAL #4   Title  Pt will improve gait speed to >/=2.80f/sec. with LRAD to decr. falls risk.     Status  Partially Met      PT SHORT TERM GOAL #5   Title  Pt will improve BERG score to >/=34/56 to decr. falls risk.     Status  Achieved        PT Long Term Goals - 04/22/18 1236      PT LONG TERM GOAL #1   Title  Pt will improve gait speed with LRAD to >/=2.666fsec to safely amb. in the community. TARGET DATE FOR ALL LTGS: 04/22/18 (ALL UNMET LTGS WILL BE CARRIED OVER TO NEW POC: 05/20/18)    Baseline  1.9130fec on 04/20/18    Status  Partially Met      PT LONG TERM GOAL #2   Title  Pt will improve BERG score to >/=46/56 to decr. falls risk.     Baseline  42/56 on 04/15/18    Status  Revised      PT LONG TERM GOAL #3   Title  Pt will amb. 500' over even/uneven terrain with LRAD at MOD I level to improve functional mobility.     Status  Partially Met      PT LONG TERM GOAL #4   Title  Pt will amb. 100' with SPC, at MOD I Level, over  even terrain to safely use SPC at home.    Status  Deferred      PT LONG TERM GOAL #5   Title  Pt will be IND in progressed HEP to improve strength, endurance, balance, and flexibility.     Status  New            Plan - 05/10/18 1456    Clinical Impression Statement  Today's skilled session focused on LE and core strengthening with only fatigue reported. The pt is progressing toward goals and should benefit from continued PT to progress toward unmet goals.     Rehab Potential  Good    Clinical Impairments Affecting Rehab Potential  see above    PT Frequency  2x / week   original POC: 2x/week for 8 weeks   PT Duration  4 weeks    PT Treatment/Interventions  ADLs/Self Care Home Management;Aquatic Therapy;Biofeedback;Canalith Repostioning;Electrical Stimulation;Therapeutic exercise;Therapeutic activities;Manual techniques;Vestibular;Functional mobility training;Orthotic Fit/Training;Stair training;Gait training;Patient/family education;DME Instruction;Neuromuscular re-education    PT Next Visit Plan  balance reactions training; PT sent request for Speech consult to neurologist. BLE NMR (had difficulty with half kneeling)    PT Home Exercise Plan  [id number]    Consulted and Agree with Plan of Care  Patient       Patient will benefit from skilled therapeutic intervention in order to improve the following deficits and impairments:  Abnormal gait, Decreased endurance, Impaired sensation, Decreased knowledge of use of DME, Decreased strength, Impaired tone, Impaired UE functional use, Decreased balance, Decreased mobility, Pain, Decreased range of motion, Postural dysfunction, Impaired flexibility, Decreased coordination(impaired tone per pt, none noted during exam, no pain during session-PT will not treat directly but will monitor closely)  Visit Diagnosis: Muscle weakness (generalized)  Unsteadiness on feet  Other abnormalities of gait and mobility     Problem List Patient Active  Problem List   Diagnosis Date Noted  . Ankle pain, right 08/11/2017  . Hypotension 06/07/2017  . Failed total hip arthroplasty with dislocation ([place]) 06/05/2017  . Cough variant asthma vs vcd  related to [facility]  11/10/2016  . Positional sleep apnea 09/10/2016  . Periodic limb movements of sleep 09/10/2016  . OSA on CPAP 02/08/2016  . S/P revision right [facility] 09/18/2015  . Leg swelling 08/21/2014  . Acute URI 04/04/2014  . Health care maintenance 12/16/2013  . Other and unspecified hyperlipidemia 11/04/2013  . 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 S/P hip hemiarthroplasty 03/15/2013  . Dislocation of hip prosthesis ([place]) 03/12/2013  . Hip dislocation, left ([place]) 03/12/2013  . Hip dislocation, right ([place]) 03/12/2013  . Urinary retention with incomplete bladder emptying 03/09/2013  . Dysphagia, pharyngoesophageal phase 03/09/2013  . Femoral neck fracture ([place]) 03/08/2013  . Unspecified constipation 02/26/2013  . Fractured femoral neck ([place]) 02/23/2013  . Hip fracture ([place]) 02/23/2013  . Excessive flatus 10/21/2012  . Cold feet 07/29/2012  . Hyponatremia 05/20/2012  . Calf pain 11/17/2011  . Dyspnea 12/06/2007  . Post-menopausal osteoporosis 09/17/2007  . Hypothyroidism 04/01/2007  . Multiple sclerosis ([place]) 04/01/2007  . Other diseases of vocal cords 04/01/2007    Willow Ora, PTA, [facility] [address] [place], [place] [address] [phone] 05/11/18, 9:19 PM   Name: SHARMILA WROBLESKI MRN: [id number] Date of Birth: February 06, 1951

## 2018-05-11 ENCOUNTER — Ambulatory Visit: Payer: Medicare Other | Admitting: Occupational Therapy

## 2018-05-11 DIAGNOSIS — M6281 Muscle weakness (generalized): Secondary | ICD-10-CM | POA: Diagnosis not present

## 2018-05-11 DIAGNOSIS — R293 Abnormal posture: Secondary | ICD-10-CM | POA: Diagnosis not present

## 2018-05-11 DIAGNOSIS — M50322 Other cervical disc degeneration at C5-C6 level: Secondary | ICD-10-CM | POA: Diagnosis not present

## 2018-05-11 DIAGNOSIS — R278 Other lack of coordination: Secondary | ICD-10-CM | POA: Diagnosis not present

## 2018-05-11 DIAGNOSIS — G8191 Hemiplegia, unspecified affecting right dominant side: Secondary | ICD-10-CM | POA: Diagnosis not present

## 2018-05-11 DIAGNOSIS — R2689 Other abnormalities of gait and mobility: Secondary | ICD-10-CM | POA: Diagnosis not present

## 2018-05-11 DIAGNOSIS — S86011D Strain of right Achilles tendon, subsequent encounter: Secondary | ICD-10-CM | POA: Diagnosis not present

## 2018-05-11 DIAGNOSIS — R2681 Unsteadiness on feet: Secondary | ICD-10-CM

## 2018-05-11 DIAGNOSIS — M9903 Segmental and somatic dysfunction of lumbar region: Secondary | ICD-10-CM | POA: Diagnosis not present

## 2018-05-11 DIAGNOSIS — M9901 Segmental and somatic dysfunction of cervical region: Secondary | ICD-10-CM | POA: Diagnosis not present

## 2018-05-11 DIAGNOSIS — M40292 Other kyphosis, cervical region: Secondary | ICD-10-CM | POA: Diagnosis not present

## 2018-05-11 NOTE — Therapy (Signed)
[facility] [address] [place], [place], [address] Phone: [phone]   Fax:  [phone]  Occupational Therapy Treatment  Patient Details  Name: Kayla [name] MRN: [id number] Date of Birth: 1950-08-22 No data recorded  Encounter Date: 05/11/2018  OT End of Session - 05/11/18 0946    Visit Number  5    Number of Visits  9    Date for OT Re-Evaluation  05/15/18    Authorization Type  Medicare, BCBS--covered 100%    Authorization Time Period  cert. 04/15/18-07/14/18    Authorization - Visit Number  5    Authorization - Number of Visits  10    OT Start Time  [phone]    OT Stop Time  1015    OT Time Calculation (min)  39 min    Activity Tolerance  Patient tolerated treatment well    Behavior During Therapy  WFL for tasks assessed/performed       Past Medical History:  Diagnosis Date  . Arthritis    oa  . Cervical cancer ([place]) 2005  . COPD (chronic obstructive pulmonary disease) ([place])   . Multiple sclerosis ([place])   . Osteoporosis   . Other diseases of vocal cords    vocal cords are not even  . Other voice and resonance disorders   . Pneumonia 2014, 1992  . Sleep apnea    no cpap used  . Unspecified hypothyroidism     Past Surgical History:  Procedure Laterality Date  . ABDOMINAL HYSTERECTOMY  2005   for cervical cancer, complete  . CONVERSION TO TOTAL HIP Right 09/18/2015   Procedure: CONVERSION OF RIGHT HEMI ARTHROPLASTY TO TOTAL HIP ARTHROPLASTY ACETABULAR REVISON ;  Surgeon: Paralee Cancel, MD;  [facility];  Service: Orthopedics;  Laterality: Right;  . EYE SURGERY Bilateral    lens for cataracts  . HIP ARTHROPLASTY Right 03/01/2013   Procedure: ARTHROPLASTY BIPOLAR HIP;  Surgeon: Mauri Pole, MD;  [facility];  Service: Orthopedics;  Laterality: Right;  . HIP CLOSED REDUCTION Right 03/12/2013   Procedure: CLOSED REDUCTION HIP;  Surgeon: Gearlean Alf, MD;  Location: [facility];  Service:  Orthopedics;  Laterality: Right;  . HIP CLOSED REDUCTION Right 03/19/2013   Procedure: CLOSED REDUCTION HIP;  Surgeon: Johnn Hai, MD;  Location: [facility];  Service: Orthopedics;  Laterality: Right;  . HIP CLOSED REDUCTION Right 11/10/2015   Procedure: CLOSED MANIPULATION HIP;  Surgeon: Rod Can, MD;  [facility];  Service: Orthopedics;  Laterality: Right;  . HIP CLOSED REDUCTION Right 06/05/2017   Procedure: CLOSED REDUCTION HIP;  Surgeon: [name], MD;  [facility];  Service: Orthopedics;  Laterality: Right;  . TOTAL HIP REVISION Right 06/06/2017   Procedure: TOTAL HIP REVISION;  Surgeon: [name], MD;  [facility];  Service: Orthopedics;  Laterality: Right;  [facility] Kitchen VESICOVAGINAL FISTULA CLOSURE W/ TAH  2005    There were no vitals filed for this visit.  Subjective Assessment - 05/11/18 1114    Subjective   Denies pain, reports being really tired after PT yesterday    Limitations  MS.    PMH:  hx of R shoulder fx 11/2016, hypothyroidism, osteoporosis, hx of R hip dislocation/fx and multiple surgeries, hx of R achilles rupture, positional sleep apnea, hypotension, hx of cervical CA, arthritis, COPD    Patient Stated Goals  improve R shoulder strength/ROM, be able to return to working out at gym    Currently in Pain?  No/denies            Treatment:  supine closed chain shoulder flexion, internal/ eternal rotation and butterfly stretch, min v.c for positioning, 10 reps each. Seated shoulder abduction, and shoulder extension closed chain 10 reps each, min v.c for positioning Seated closed chain shoulder flexion with 2 lbs wrapped on cane x 10 reps, shoulder abduction with 1 lbs weight in each hand followed by shoulder flexion with 1 lbs  in each hand, min v.c for positioning. Standing to perform mid to high level reaching with RUE, min v.c to avoid compensation. Arm bike x 5 mins level 3 for conditioning, min v.c for speed               OT  Education - 05/11/18 1021    Education Details  updates to HEP- see pt instructions    Person(s) Educated  Patient    Methods  Explanation;Demonstration;Verbal cues;Handout    Comprehension  Verbalized understanding;Returned demonstration;Verbal cues required          OT Long Term Goals - 05/07/18 1020      OT LONG TERM GOAL #1   Title  Pt will be independent with HEP for RUE ROM/strength.--check goals 05/16/18    Status  Achieved      OT LONG TERM GOAL #2   Title  Pt will be able to lift/retrieve 2lb object using at least 115* shoulder flex with RUE safely x3.    Status  On-going      OT LONG TERM GOAL #3   Title  Pt will verbalize understanding of appropriate community fitness activities and adaptions for activities prn.    Status  On-going      OT LONG TERM GOAL #4   Title  Pt will be able to demo at least 125* R shoulder flex without compensation for ADLs/IADLs.    Baseline  110*    Status  On-going      OT LONG TERM GOAL #5   Title  Pt will demo at least 120* R shoulder abduction for ADLs/IADLs.    Baseline  110*    Status  On-going            Plan - 05/11/18 1021    Clinical Impression Statement  Pt is progressing towards goals with improving RUE strength and ROM.    Occupational Profile and client history currently impacting functional performance  Pt is mod I with ADLs/IADLs.  However, pt reports difficulty with overhead reaching tasks and would like to be able to return to community fitness activities.    Occupational performance deficits (Please refer to evaluation for details):  ADL's;IADL's;Leisure;Social Participation    Rehab Potential  Good    OT Frequency  2x / week    OT Duration  4 weeks    OT Treatment/Interventions  Self-care/ADL training;Therapeutic exercise;Patient/family education;Neuromuscular education;Moist Heat;Aquatic Therapy;Energy conservation;Therapist, nutritional;Therapeutic activities;Passive range of motion;Manual Therapy;DME  and/or AE instruction;Cryotherapy    Plan   education re: community fitness, continue to address RUE functional reach    Consulted and Agree with Plan of Care  Patient       Patient will benefit from skilled therapeutic intervention in order to improve the following deficits and impairments:  Decreased balance, Decreased endurance, Decreased mobility, Difficulty walking, Decreased range of motion, Decreased activity tolerance, Decreased coordination, Decreased knowledge of use of DME, Decreased strength, Impaired flexibility, Impaired UE functional use, Improper spinal/pelvic alignment  Visit Diagnosis: Muscle weakness (generalized)  Unsteadiness on feet  Problem List Patient Active Problem List   Diagnosis Date Noted  . Ankle pain, right 08/11/2017  . Hypotension 06/07/2017  . Failed total hip arthroplasty with dislocation ([place]) 06/05/2017  . Cough variant asthma vs vcd  related to [facility]  11/10/2016  . Positional sleep apnea 09/10/2016  . Periodic limb movements of sleep 09/10/2016  . OSA on CPAP 02/08/2016  . S/P revision right [facility] 09/18/2015  . Leg swelling 08/21/2014  . Acute URI 04/04/2014  . Health care maintenance 12/16/2013  . Other and unspecified hyperlipidemia 11/04/2013  . 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 S/P hip hemiarthroplasty 03/15/2013  . Dislocation of hip prosthesis ([place]) 03/12/2013  . Hip dislocation, left ([place]) 03/12/2013  . Hip dislocation, right ([place]) 03/12/2013  . Urinary retention with incomplete bladder emptying 03/09/2013  . Dysphagia, pharyngoesophageal phase 03/09/2013  . Femoral neck fracture ([place]) 03/08/2013  . Unspecified constipation 02/26/2013  . Fractured femoral neck ([place]) 02/23/2013  . Hip fracture ([place]) 02/23/2013  . Excessive flatus 10/21/2012  . Cold feet 07/29/2012  . Hyponatremia 05/20/2012  . Calf pain 11/17/2011  . Dyspnea 12/06/2007  . Post-menopausal osteoporosis 09/17/2007  . Hypothyroidism 04/01/2007  . Multiple sclerosis  ([place]) 04/01/2007  . Other diseases of vocal cords 04/01/2007    Linh Hedberg 05/11/2018, 11:14 AM Theone Murdoch, OTR/L [phone] Phone: [phone] 11:15 AM 05/11/18 [facility] [address] [place], [place], [address] Phone: [phone]   Fax:  [phone]  Name: Kayla [name] MRN: [id number] Date of Birth: February 08, 1951

## 2018-05-11 NOTE — Patient Instructions (Signed)
SHOULDER: Flexion - Sitting    Hold cane with both hands. Raise arms up. Keep elbows straight. Hold 5___ seconds. Use __2_ lb weight on cane. __10-_ reps per set, __1-2_ sets per day, Willey Blade other day Copyright  VHI. All rights reserved.    SHOULDER: Abduction (Weight)    Raise arm out and up. Keep elbow straight. Do not shrug shoulders. Hold __5_ seconds. Use __1_ lb weight. __10-15_ reps per set, 1-2___ sets per day, Willey Blade other day 1__ lb dumbbell.  Copyright  VHI. All rights reserved.

## 2018-05-12 ENCOUNTER — Ambulatory Visit: Payer: Medicare Other | Admitting: Occupational Therapy

## 2018-05-12 ENCOUNTER — Encounter: Payer: Self-pay | Admitting: Occupational Therapy

## 2018-05-12 DIAGNOSIS — M6281 Muscle weakness (generalized): Secondary | ICD-10-CM | POA: Diagnosis not present

## 2018-05-12 DIAGNOSIS — R2689 Other abnormalities of gait and mobility: Secondary | ICD-10-CM

## 2018-05-12 DIAGNOSIS — R293 Abnormal posture: Secondary | ICD-10-CM

## 2018-05-12 DIAGNOSIS — R278 Other lack of coordination: Secondary | ICD-10-CM

## 2018-05-12 DIAGNOSIS — R2681 Unsteadiness on feet: Secondary | ICD-10-CM

## 2018-05-12 NOTE — Therapy (Signed)
[facility] [address] [place], [place], [address] Phone: [phone]   Fax:  [phone]  Occupational Therapy Treatment  Patient Details  Name: Kayla [name] MRN: [id number] Date of Birth: 02/26/1951 No data recorded  Encounter Date: 05/12/2018  OT End of Session - 05/12/18 0946    Visit Number  6    Number of Visits  9    Date for OT Re-Evaluation  05/15/18    Authorization Type  Medicare, BCBS--covered 100%    Authorization Time Period  cert. 04/15/18-07/14/18    Authorization - Visit Number  6    Authorization - Number of Visits  10    OT Start Time  [phone]    OT Stop Time  1018    OT Time Calculation (min)  40 min    Activity Tolerance  Patient tolerated treatment well    Behavior During Therapy  [facility] for tasks assessed/performed       Past Medical History:  Diagnosis Date  . Arthritis    oa  . Cervical cancer ([place]) 2005  . COPD (chronic obstructive pulmonary disease) ([place])   . Multiple sclerosis ([place])   . Osteoporosis   . Other diseases of vocal cords    vocal cords are not even  . Other voice and resonance disorders   . Pneumonia 2014, 1992  . Sleep apnea    no cpap used  . Unspecified hypothyroidism     Past Surgical History:  Procedure Laterality Date  . ABDOMINAL HYSTERECTOMY  2005   for cervical cancer, complete  . CONVERSION TO TOTAL HIP Right 09/18/2015   Procedure: CONVERSION OF RIGHT HEMI ARTHROPLASTY TO TOTAL HIP ARTHROPLASTY ACETABULAR REVISON ;  Surgeon: Paralee Cancel, MD;  [facility];  Service: Orthopedics;  Laterality: Right;  . EYE SURGERY Bilateral    lens for cataracts  . HIP ARTHROPLASTY Right 03/01/2013   Procedure: ARTHROPLASTY BIPOLAR HIP;  Surgeon: Mauri Pole, MD;  [facility];  Service: Orthopedics;  Laterality: Right;  . HIP CLOSED REDUCTION Right 03/12/2013   Procedure: CLOSED REDUCTION HIP;  Surgeon: Gearlean Alf, MD;  Location: [facility];  Service:  Orthopedics;  Laterality: Right;  . HIP CLOSED REDUCTION Right 03/19/2013   Procedure: CLOSED REDUCTION HIP;  Surgeon: Johnn Hai, MD;  Location: [facility];  Service: Orthopedics;  Laterality: Right;  . HIP CLOSED REDUCTION Right 11/10/2015   Procedure: CLOSED MANIPULATION HIP;  Surgeon: Rod Can, MD;  [facility];  Service: Orthopedics;  Laterality: Right;  . HIP CLOSED REDUCTION Right 06/05/2017   Procedure: CLOSED REDUCTION HIP;  Surgeon: [name], MD;  [facility];  Service: Orthopedics;  Laterality: Right;  . TOTAL HIP REVISION Right 06/06/2017   Procedure: TOTAL HIP REVISION;  Surgeon: [name], MD;  [facility];  Service: Orthopedics;  Laterality: Right;  Marland Kitchen VESICOVAGINAL FISTULA CLOSURE W/ TAH  2005    There were no vitals filed for this visit.  Subjective Assessment - 05/12/18 0941    Subjective   Pt reports that she zipped her dress in the back herself    Limitations  MS.    PMH:  hx of R shoulder fx 11/2016, hypothyroidism, osteoporosis, hx of R hip dislocation/fx and multiple surgeries, hx of R achilles rupture, positional sleep apnea, hypotension, hx of cervical CA, arthritis, COPD    Patient Stated Goals  improve R shoulder strength/ROM, be able to return to working out at gym    Currently  in Pain?  No/denies       Arm bike x 18mn level 3 without rest for reciprocal movement/conditioning  In supine, AAROM shoulder flex with cane and chest press with 2lb wt on cane x10 each.    In sitting, AAROM shoulder flex, abduction, ER with cane followed by shoulder flex and chest press to 90* with 2lb wt on cane x10 each.  Yellow theraband ex for shoulder flex, ext, horizontal abduction, and scapular retraction x10 each with min cueing for compensation.   In standing, functional reaching overhead to place large pegs in vertical pegboard with RUE with min cueing intermittently for compensation.    Discussed appropriate community fitness options--goal met  (see goal section below).  Checked ROM-see goals section below.        OT Long Term Goals - 05/12/18 0959      OT LONG TERM GOAL #1   Title  Pt will be independent with HEP for RUE ROM/strength.--check goals 05/16/18    Status  Achieved      OT LONG TERM GOAL #2   Title  Pt will be able to lift/retrieve 2lb object using at least 115* shoulder flex with RUE safely x3.    Status  On-going      OT LONG TERM GOAL #3   Title  Pt will verbalize understanding of appropriate community fitness activities and adaptions for activities prn.    Status  Achieved   05/12/18:  discussed ACT (with trainer set-up), [place] Sneakers, [place]   Title  Pt will be able to demo at least 125* R shoulder flex without compensation for ADLs/IADLs.    Baseline  110*    Status  On-going   05/12/18:  115*      OT LONG TERM GOAL #5   Title  Pt will demo at least 120* R shoulder abduction for ADLs/IADLs.    Baseline  110*    Status  On-going   05/12/18:  115*           Plan - 05/12/18 0946    Clinical Impression Statement  Pt is progressing towards goals with improving RUE strength and ROM.  Pt verbalized understanding of appropriate community fitness activities.    Occupational Profile and client history currently impacting functional performance  Pt is mod I with ADLs/IADLs.  However, pt reports difficulty with overhead reaching tasks and would like to be able to return to community fitness activities.    Occupational performance deficits (Please refer to evaluation for details):  ADL's;IADL's;Leisure;Social Participation    Rehab Potential  Good    OT Frequency  2x / week    OT Duration  4 weeks    OT Treatment/Interventions  Self-care/ADL training;Therapeutic exercise;Patient/family education;Neuromuscular education;Moist Heat;Aquatic Therapy;Energy conservation;FTherapist, nutritionalTherapeutic activities;Passive range of motion;Manual Therapy;DME and/or  AE instruction;Cryotherapy    Plan  continue to address RUE functional reach, strength    Consulted and Agree with Plan of Care  Patient       Patient will benefit from skilled therapeutic intervention in order to improve the following deficits and impairments:  Decreased balance, Decreased endurance, Decreased mobility, Difficulty walking, Decreased range of motion, Decreased activity tolerance, Decreased coordination, Decreased knowledge of use of DME, Decreased strength, Impaired flexibility, Impaired UE functional use, Improper spinal/pelvic alignment  Visit Diagnosis: Muscle weakness (generalized)  Unsteadiness on feet  Other abnormalities of gait and mobility  Other lack of coordination  Abnormal posture  Problem List Patient Active Problem List   Diagnosis Date Noted  . Ankle pain, right 08/11/2017  . Hypotension 06/07/2017  . Failed total hip arthroplasty with dislocation ([place]) 06/05/2017  . Cough variant asthma vs vcd  related to [facility]  11/10/2016  . Positional sleep apnea 09/10/2016  . Periodic limb movements of sleep 09/10/2016  . OSA on CPAP 02/08/2016  . S/P revision right [facility] 09/18/2015  . Leg swelling 08/21/2014  . Acute URI 04/04/2014  . Health care maintenance 12/16/2013  . Other and unspecified hyperlipidemia 11/04/2013  . 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 S/P hip hemiarthroplasty 03/15/2013  . Dislocation of hip prosthesis ([place]) 03/12/2013  . Hip dislocation, left ([place]) 03/12/2013  . Hip dislocation, right ([place]) 03/12/2013  . Urinary retention with incomplete bladder emptying 03/09/2013  . Dysphagia, pharyngoesophageal phase 03/09/2013  . Femoral neck fracture ([place]) 03/08/2013  . Unspecified constipation 02/26/2013  . Fractured femoral neck ([place]) 02/23/2013  . Hip fracture ([place]) 02/23/2013  . Excessive flatus 10/21/2012  . Cold feet 07/29/2012  . Hyponatremia 05/20/2012  . Calf pain 11/17/2011  . Dyspnea 12/06/2007  . Post-menopausal osteoporosis  09/17/2007  . Hypothyroidism 04/01/2007  . Multiple sclerosis ([place]) 04/01/2007  . Other diseases of vocal cords 04/01/2007    [facility] 05/12/2018, 11:00 AM  [facility] [address] [place], [place], [address] Phone: [phone]   Fax:  [phone]  Name: GERRY HEAPHY MRN: [id number] Date of Birth: [birth year]   Vianne Bulls, OTR/L [facility] [address] [place], [place]  [address] [phone] phone [phone] 05/12/18 11:00 AM

## 2018-05-14 ENCOUNTER — Ambulatory Visit: Payer: Medicare Other | Admitting: Physical Therapy

## 2018-05-14 ENCOUNTER — Encounter: Payer: Self-pay | Admitting: Physical Therapy

## 2018-05-14 DIAGNOSIS — R2689 Other abnormalities of gait and mobility: Secondary | ICD-10-CM | POA: Diagnosis not present

## 2018-05-14 DIAGNOSIS — M6281 Muscle weakness (generalized): Secondary | ICD-10-CM

## 2018-05-14 DIAGNOSIS — R278 Other lack of coordination: Secondary | ICD-10-CM | POA: Diagnosis not present

## 2018-05-14 DIAGNOSIS — R2681 Unsteadiness on feet: Secondary | ICD-10-CM | POA: Diagnosis not present

## 2018-05-14 DIAGNOSIS — R293 Abnormal posture: Secondary | ICD-10-CM | POA: Diagnosis not present

## 2018-05-14 NOTE — Therapy (Signed)
[facility] [address] [place], [place], [address] Phone: [phone]   Fax:  [phone]  Physical Therapy Treatment  Patient Details  Name: Kayla [name] MRN: [id number] Date of Birth: 1950-08-03 Referring Provider (PT): Dr. Almedia Balls   Encounter Date: 05/14/2018  PT End of Session - 05/14/18 0939    Visit Number  17    Number of Visits  25   per updated POC   Date for PT Re-Evaluation  05/24/18   per updated POC   Authorization Type  Medicare primary and BCBS secondary    PT Start Time  0933    PT Stop Time  1015    PT Time Calculation (min)  42 min    Activity Tolerance  Patient tolerated treatment well;No increased pain    Behavior During Therapy  WFL for tasks assessed/performed       Past Medical History:  Diagnosis Date  . Arthritis    oa  . Cervical cancer ([place]) 2005  . COPD (chronic obstructive pulmonary disease) ([place])   . Multiple sclerosis ([place])   . Osteoporosis   . Other diseases of vocal cords    vocal cords are not even  . Other voice and resonance disorders   . Pneumonia 2014, 1992  . Sleep apnea    no cpap used  . Unspecified hypothyroidism     Past Surgical History:  Procedure Laterality Date  . ABDOMINAL HYSTERECTOMY  2005   for cervical cancer, complete  . CONVERSION TO TOTAL HIP Right 09/18/2015   Procedure: CONVERSION OF RIGHT HEMI ARTHROPLASTY TO TOTAL HIP ARTHROPLASTY ACETABULAR REVISON ;  Surgeon: Paralee Cancel, MD;  [facility];  Service: Orthopedics;  Laterality: Right;  . EYE SURGERY Bilateral    lens for cataracts  . HIP ARTHROPLASTY Right 03/01/2013   Procedure: ARTHROPLASTY BIPOLAR HIP;  Surgeon: Mauri Pole, MD;  [facility];  Service: Orthopedics;  Laterality: Right;  . HIP CLOSED REDUCTION Right 03/12/2013   Procedure: CLOSED REDUCTION HIP;  Surgeon: Gearlean Alf, MD;  Location: [facility];  Service: Orthopedics;  Laterality: Right;  . HIP CLOSED  REDUCTION Right 03/19/2013   Procedure: CLOSED REDUCTION HIP;  Surgeon: Johnn Hai, MD;  Location: [facility];  Service: Orthopedics;  Laterality: Right;  . HIP CLOSED REDUCTION Right 11/10/2015   Procedure: CLOSED MANIPULATION HIP;  Surgeon: Rod Can, MD;  [facility];  Service: Orthopedics;  Laterality: Right;  . HIP CLOSED REDUCTION Right 06/05/2017   Procedure: CLOSED REDUCTION HIP;  Surgeon: [name], MD;  [facility];  Service: Orthopedics;  Laterality: Right;  . TOTAL HIP REVISION Right 06/06/2017   Procedure: TOTAL HIP REVISION;  Surgeon: [name], MD;  [facility];  Service: Orthopedics;  Laterality: Right;  Marland Kitchen VESICOVAGINAL FISTULA CLOSURE W/ TAH  2005    There were no vitals filed for this visit.  Subjective Assessment - 05/14/18 0938    Subjective  No new complaints. No falls or pain to report. Wants to do the same ex's as last time as she reports "i felt it in my hips".     Pertinent History  MS, hypothyroidism, sleep apnea (no longer using CPAP per last sleep study), bladder dysfunction, COPD, asthma, arthritis, osteoporsis    Patient Stated Goals  Walk with my cane again and get R side working again    Currently in Pain?  No/denies    Pain Score  0-No pain  [facility] Adult PT Treatment/Exercise - 05/14/18 0940      Transfers   Transfers  Sit to Stand;Stand to Sit    Sit to Stand  5: Supervision;With upper extremity assist;From chair/3-in-1;From bed    Stand to Sit  5: Supervision;With upper extremity assist;To bed;To chair/3-in-1      Ambulation/Gait   Ambulation/Gait  Yes    Ambulation/Gait Assistance  5: Supervision    Ambulation/Gait Assistance Details  cues for "soft knees" in stance with gait.     Ambulation Distance (Feet)  50 Feet   x2   Assistive device  Rolling walker    Gait Pattern  Step-through pattern;Decreased stride length;Decreased dorsiflexion - left;Decreased dorsiflexion - right;Right genu recurvatum;Left  genu recurvatum;Trunk flexed;Narrow base of support    Ambulation Surface  Level;Indoor      Neuro Re-ed    Neuro Re-ed Details   for stengthening/improved proximal hip control: tall kneeling with hands on red pball: mini squats x 10 reps with assist/emphasis on equal LE weight bearing, then fwd roll outs/back in with emphasis on controlled movements, abd bracing with each rep; quadruped over red pball: alternating leg lifts x 10 reps each with min assist, then alternating combo contralateral UE/leg raises x 10 reps each side with min assist for balance/stability. decr lift of legs with both ex's; seated with red band around legs just above knees: sit<>stand x 10 reps with emphasis on keeping band pulled tight, min guard assist with cues for full standing and slow, controlled sitting.        Knee/Hip Exercises: Supine   Bridges Limitations  figure 4 bridge x 10 reps each side. cues on technique/ex form.     Single Leg Bridge  AROM;Strengthening;Both;1 set;10 reps;Limitations      Knee/Hip Exercises: Prone   Hamstring Curl  2 sets;10 reps;Limitations    Hamstring Curl Limitations  assist needed for control and form, 1# ankle weight use on left LE.     Hip Extension  AROM;AAROM;Strengthening;Both;10 reps;Limitations;2 sets    Hip Extension Limitations  assistance needed to maintain knee extension, for lift and control. >assist needed with left LE.     Other Prone Exercises  glut kick backs 2 sets of 10 reps AAROM          PT Short Term Goals - 03/25/18 1058      PT SHORT TERM GOAL #1   Title  Pt will be IND in HEP to improve deficits listed above. TARGET DATE FOR ALL STGS: 03/25/18    Status  Achieved      PT SHORT TERM GOAL #2   Title  Pt will improve TUG time with LRAD to </=13.5 sec. to decr. falls risk.     Status  Partially Met      PT SHORT TERM GOAL #3   Title  Pt will amb. 300' over even terrain, at MOD I level, with LRAD to improve functional mobility.     Status  Achieved       PT SHORT TERM GOAL #4   Title  Pt will improve gait speed to >/=2.13f/sec. with LRAD to decr. falls risk.     Status  Partially Met      PT SHORT TERM GOAL #5   Title  Pt will improve BERG score to >/=34/56 to decr. falls risk.     Status  Achieved        PT Long Term Goals - 04/22/18 1236      PT LONG TERM GOAL #  1   Title  Pt will improve gait speed with LRAD to >/=2.56f/sec to safely amb. in the community. TARGET DATE FOR ALL LTGS: 04/22/18 (ALL UNMET LTGS WILL BE CARRIED OVER TO NEW POC: 05/20/18)    Baseline  1.941fsec on 04/20/18    Status  Partially Met      PT LONG TERM GOAL #2   Title  Pt will improve BERG score to >/=46/56 to decr. falls risk.     Baseline  42/56 on 04/15/18    Status  Revised      PT LONG TERM GOAL #3   Title  Pt will amb. 500' over even/uneven terrain with LRAD at MOD I level to improve functional mobility.     Status  Partially Met      PT LONG TERM GOAL #4   Title  Pt will amb. 100' with SPC, at MOD I Level, over even terrain to safely use SPC at home.    Status  Deferred      PT LONG TERM GOAL #5   Title  Pt will be IND in progressed HEP to improve strength, endurance, balance, and flexibility.     Status  New            Plan - 05/14/18 [id number]  Clinical Impression Statement  Today's skilled session continued to focus on LE and core strengthening with fatigue reported that rest breaks relieved. The pt is progressing toward goals and should benefit from continued PT to progress toward unmet goals.     Rehab Potential  Good    Clinical Impairments Affecting Rehab Potential  see above    PT Frequency  2x / week   original POC: 2x/week for 8 weeks   PT Duration  4 weeks    PT Treatment/Interventions  ADLs/Self Care Home Management;Aquatic Therapy;Biofeedback;Canalith Repostioning;Electrical Stimulation;Therapeutic exercise;Therapeutic activities;Manual techniques;Vestibular;Functional mobility training;Orthotic Fit/Training;Stair  training;Gait training;Patient/family education;DME Instruction;Neuromuscular re-education    PT Next Visit Plan  LTGs due next week /05/20/18); check on order for Speech consult    PT Home Exercise Plan  [id number]    Consulted and Agree with Plan of Care  Patient       Patient will benefit from skilled therapeutic intervention in order to improve the following deficits and impairments:  Abnormal gait, Decreased endurance, Impaired sensation, Decreased knowledge of use of DME, Decreased strength, Impaired tone, Impaired UE functional use, Decreased balance, Decreased mobility, Pain, Decreased range of motion, Postural dysfunction, Impaired flexibility, Decreased coordination(impaired tone per pt, none noted during exam, no pain during session-PT will not treat directly but will monitor closely)  Visit Diagnosis: Muscle weakness (generalized)  Unsteadiness on feet  Other abnormalities of gait and mobility     Problem List Patient Active Problem List   Diagnosis Date Noted  . Ankle pain, right 08/11/2017  . Hypotension 06/07/2017  . Failed total hip arthroplasty with dislocation ([place]CF[place]03/15/2019  . Cough variant asthma vs vcd  [facility]/20/2018  . Positional sleep apnea 09/10/2016  . Periodic limb movements of sleep 09/10/2016  . OSA on CPAP 02/08/2016  . [facility]  . Leg swelling 08/21/2014  . Acute URI 04/04/2014  . Health care maintenance 12/16/2013  . Other and unspecified hyperlipidemia 11/04/2013  . 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 S/P hip hemiarthroplasty 03/15/2013  . Dislocation of hip prosthesis ([place]CT[place]12/20/2014  . Hip dislocation, left (HCD[place]12/20/2014  . Hip dislocation, right (HCGarrett12/20/2014  . Urinary retention with  incomplete bladder emptying 03/09/2013  . Dysphagia, pharyngoesophageal phase 03/09/2013  . Femoral neck fracture ([place]) 03/08/2013  . Unspecified constipation 02/26/2013  . Fractured femoral neck ([place]) 02/23/2013  . Hip  fracture ([place]) 02/23/2013  . Excessive flatus 10/21/2012  . Cold feet 07/29/2012  . Hyponatremia 05/20/2012  . Calf pain 11/17/2011  . Dyspnea 12/06/2007  . Post-menopausal osteoporosis 09/17/2007  . Hypothyroidism 04/01/2007  . Multiple sclerosis ([place]) 04/01/2007  . Other diseases of vocal cords 04/01/2007    Willow Ora, PTA, [facility] [address], [place], [place] [address] [phone] 05/14/18, 12:25 PM   Name: ROLANDE MOE MRN: [id number] Date of Birth: 1950/12/24

## 2018-05-17 ENCOUNTER — Ambulatory Visit: Payer: Medicare Other | Admitting: Occupational Therapy

## 2018-05-17 ENCOUNTER — Ambulatory Visit: Payer: Medicare Other | Admitting: Physical Therapy

## 2018-05-17 ENCOUNTER — Encounter: Payer: Self-pay | Admitting: Occupational Therapy

## 2018-05-17 ENCOUNTER — Encounter: Payer: Self-pay | Admitting: Physical Therapy

## 2018-05-17 DIAGNOSIS — M6281 Muscle weakness (generalized): Secondary | ICD-10-CM | POA: Diagnosis not present

## 2018-05-17 DIAGNOSIS — R278 Other lack of coordination: Secondary | ICD-10-CM | POA: Diagnosis not present

## 2018-05-17 DIAGNOSIS — R293 Abnormal posture: Secondary | ICD-10-CM

## 2018-05-17 DIAGNOSIS — R2689 Other abnormalities of gait and mobility: Secondary | ICD-10-CM | POA: Diagnosis not present

## 2018-05-17 DIAGNOSIS — R2681 Unsteadiness on feet: Secondary | ICD-10-CM

## 2018-05-17 NOTE — Therapy (Signed)
[facility] [address] [place], [place], [address] Phone: [phone]   Fax:  [phone]  Occupational Therapy Treatment  Patient Details  Name: Kayla [name] MRN: [id number] Date of Birth: [birth year] No data recorded  Encounter Date: 05/17/2018  OT End of Session - 05/17/18 1105    Visit Number  7    Number of Visits  9    Date for OT Re-Evaluation  05/15/18    Authorization Type  Medicare, BCBS--covered 100%    Authorization Time Period  cert. 04/15/18-07/14/18    Authorization - Visit Number  7    Authorization - Number of Visits  10    OT Start Time  9233    OT Stop Time  1145    OT Time Calculation (min)  42 min    Activity Tolerance  Patient tolerated treatment well    Behavior During Therapy  WFL for tasks assessed/performed       Past Medical History:  Diagnosis Date  . Arthritis    oa  . Cervical cancer ([place]) 2005  . COPD (chronic obstructive pulmonary disease) ([place])   . Multiple sclerosis ([place])   . Osteoporosis   . Other diseases of vocal cords    vocal cords are not even  . Other voice and resonance disorders   . Pneumonia 2014, 1992  . Sleep apnea    no cpap used  . Unspecified hypothyroidism     Past Surgical History:  Procedure Laterality Date  . ABDOMINAL HYSTERECTOMY  2005   for cervical cancer, complete  . CONVERSION TO TOTAL HIP Right 09/18/2015   Procedure: CONVERSION OF RIGHT HEMI ARTHROPLASTY TO TOTAL HIP ARTHROPLASTY ACETABULAR REVISON ;  Surgeon: Paralee Cancel, MD;  [facility]: [facility];  Service: Orthopedics;  Laterality: Right;  . EYE SURGERY Bilateral    lens for cataracts  . HIP ARTHROPLASTY Right 03/01/2013   Procedure: ARTHROPLASTY BIPOLAR HIP;  Surgeon: Mauri Pole, MD;  [facility];  Service: Orthopedics;  Laterality: Right;  . HIP CLOSED REDUCTION Right 03/12/2013   Procedure: CLOSED REDUCTION HIP;  Surgeon: Gearlean Alf, MD;  Location: [facility];  Service:  Orthopedics;  Laterality: Right;  . HIP CLOSED REDUCTION Right 03/19/2013   Procedure: CLOSED REDUCTION HIP;  Surgeon: Johnn Hai, MD;  Location: [facility];  Service: Orthopedics;  Laterality: Right;  . HIP CLOSED REDUCTION Right 11/10/2015   Procedure: CLOSED MANIPULATION HIP;  Surgeon: Rod Can, MD;  [facility];  Service: Orthopedics;  Laterality: Right;  . HIP CLOSED REDUCTION Right 06/05/2017   Procedure: CLOSED REDUCTION HIP;  Surgeon: [name], MD;  [facility];  Service: Orthopedics;  Laterality: Right;  . TOTAL HIP REVISION Right 06/06/2017   Procedure: TOTAL HIP REVISION;  Surgeon: [name], MD;  [facility];  Service: Orthopedics;  Laterality: Right;  [facility] VESICOVAGINAL FISTULA CLOSURE W/ TAH  2005    There were no vitals filed for this visit.  Subjective Assessment - 05/17/18 1105    Subjective   doing good, denies pain    Limitations  MS.    PMH:  hx of R shoulder fx 11/2016, hypothyroidism, osteoporosis, hx of R hip dislocation/fx and multiple surgeries, hx of R achilles rupture, positional sleep apnea, hypotension, hx of cervical CA, arthritis, COPD    Patient Stated Goals  improve R shoulder strength/ROM, be able to return to working out at gym    Currently in Pain?  No/denies  In supine, cane ex for shoulder flex, abduction, and ER.  Then shoulder flex and chest press cane ex with 2lb weight on cane.  In sitting, shoulder flex and chest press cane ex with 2lb wt on cane.  Then shoulder abduction with 1lb weight open chain x10.  In standing, shoulder ext/scapular retraction with cane with 2lb wt on cane.  Rolling ball up ball for AAROM shoulder flex.  In sitting, high functional reaching to place small pegs in vertical pegboard with set-up for end-range.   Arm bike x5 min level 3 for reciprocal movement without rest for conditioning.  In prone, scapular retraction with shoulders in extension and abduction.        OT Long Term  Goals - 05/12/18 0959      OT LONG TERM GOAL #1   Title  Pt will be independent with HEP for RUE ROM/strength.--check goals 05/16/18    Status  Achieved      OT LONG TERM GOAL #2   Title  Pt will be able to lift/retrieve 2lb object using at least 115* shoulder flex with RUE safely x3.    Status  On-going      OT LONG TERM GOAL #3   Title  Pt will verbalize understanding of appropriate community fitness activities and adaptions for activities prn.    Status  Achieved   05/12/18:  discussed ACT (with trainer set-up), [place] Sneakers, [place] #4   Title  Pt will be able to demo at least 125* R shoulder flex without compensation for ADLs/IADLs.    Baseline  110*    Status  On-going   05/12/18:  115*      OT LONG TERM GOAL #5   Title  Pt will demo at least 120* R shoulder abduction for ADLs/IADLs.    Baseline  110*    Status  On-going   05/12/18:  115*           Plan - 05/17/18 1110    Clinical Impression Statement  Pt is progressing towards goals with improving RUE strength and ROM.  Pt tolerating strengthing well    Occupational Profile and client history currently impacting functional performance  Pt is mod I with ADLs/IADLs.  However, pt reports difficulty with overhead reaching tasks and would like to be able to return to community fitness activities.    Occupational performance deficits (Please refer to evaluation for details):  ADL's;IADL's;Leisure;Social Participation    Rehab Potential  Good    OT Frequency  2x / week    OT Duration  4 weeks    OT Treatment/Interventions  Self-care/ADL training;Therapeutic exercise;Patient/family education;Neuromuscular education;Moist Heat;Aquatic Therapy;Energy conservation;Therapist, nutritional;Therapeutic activities;Passive range of motion;Manual Therapy;DME and/or AE instruction;Cryotherapy    Plan  continue to address RUE functional reach, strength    Consulted and Agree with Plan of Care   Patient       Patient will benefit from skilled therapeutic intervention in order to improve the following deficits and impairments:  Decreased balance, Decreased endurance, Decreased mobility, Difficulty walking, Decreased range of motion, Decreased activity tolerance, Decreased coordination, Decreased knowledge of use of DME, Decreased strength, Impaired flexibility, Impaired UE functional use, Improper spinal/pelvic alignment  Visit Diagnosis: Muscle weakness (generalized)  Unsteadiness on feet  Other abnormalities of gait and mobility  Other lack of coordination  Abnormal posture    Problem List Patient Active Problem List   Diagnosis Date Noted  . Ankle pain, right  08/11/2017  . Hypotension 06/07/2017  . Failed total hip arthroplasty with dislocation ([place]) 06/05/2017  . Cough variant asthma vs vcd  related to [facility]  11/10/2016  . Positional sleep apnea 09/10/2016  . Periodic limb movements of sleep 09/10/2016  . OSA on CPAP 02/08/2016  . S/P revision right [facility] 09/18/2015  . Leg swelling 08/21/2014  . Acute URI 04/04/2014  . Health care maintenance 12/16/2013  . Other and unspecified hyperlipidemia 11/04/2013  . 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 S/P hip hemiarthroplasty 03/15/2013  . Dislocation of hip prosthesis ([place]) 03/12/2013  . Hip dislocation, left ([place]) 03/12/2013  . Hip dislocation, right ([place]) 03/12/2013  . Urinary retention with incomplete bladder emptying 03/09/2013  . Dysphagia, pharyngoesophageal phase 03/09/2013  . Femoral neck fracture ([place]) 03/08/2013  . Unspecified constipation 02/26/2013  . Fractured femoral neck ([place]) 02/23/2013  . Hip fracture ([place]) 02/23/2013  . Excessive flatus 10/21/2012  . Cold feet 07/29/2012  . Hyponatremia 05/20/2012  . Calf pain 11/17/2011  . Dyspnea 12/06/2007  . Post-menopausal osteoporosis 09/17/2007  . Hypothyroidism 04/01/2007  . Multiple sclerosis ([place]) 04/01/2007  . Other diseases of vocal cords 04/01/2007     [facility] 05/17/2018, 11:12 AM  [facility] [address] [place], [place], [address] Phone: [phone]   Fax:  [phone]  Name: Kayla [name] MRN: [id number] Date of Birth: May 23, 1950   Vianne Bulls, OTR/L [facility] [address] [place], [place]  [address] [phone] phone [phone] 05/17/18 11:12 AM

## 2018-05-18 DIAGNOSIS — M40292 Other kyphosis, cervical region: Secondary | ICD-10-CM | POA: Diagnosis not present

## 2018-05-18 DIAGNOSIS — M9903 Segmental and somatic dysfunction of lumbar region: Secondary | ICD-10-CM | POA: Diagnosis not present

## 2018-05-18 DIAGNOSIS — M50322 Other cervical disc degeneration at C5-C6 level: Secondary | ICD-10-CM | POA: Diagnosis not present

## 2018-05-18 DIAGNOSIS — M9901 Segmental and somatic dysfunction of cervical region: Secondary | ICD-10-CM | POA: Diagnosis not present

## 2018-05-18 NOTE — Therapy (Signed)
[facility] [address] [place], [place], [address] Phone: [phone]   Fax:  [phone]  Physical Therapy Treatment  Patient Details  Name: Kayla [name] MRN: [id number] Date of Birth: 1950/05/08 Referring Provider (PT): Dr. Almedia Balls   Encounter Date: 05/17/2018  PT End of Session - 05/17/18 1022    Visit Number  18    Number of Visits  25   per updated POC   Date for PT Re-Evaluation  05/24/18   per updated POC   Authorization Type  Medicare primary and BCBS secondary    PT Start Time  1019    PT Stop Time  1100    PT Time Calculation (min)  41 min    Activity Tolerance  Patient tolerated treatment well;No increased pain    Behavior During Therapy  WFL for tasks assessed/performed       Past Medical History:  Diagnosis Date  . Arthritis    oa  . Cervical cancer ([place]) 2005  . COPD (chronic obstructive pulmonary disease) ([place])   . Multiple sclerosis ([place])   . Osteoporosis   . Other diseases of vocal cords    vocal cords are not even  . Other voice and resonance disorders   . Pneumonia 2014, 1992  . Sleep apnea    no cpap used  . Unspecified hypothyroidism     Past Surgical History:  Procedure Laterality Date  . ABDOMINAL HYSTERECTOMY  2005   for cervical cancer, complete  . CONVERSION TO TOTAL HIP Right 09/18/2015   Procedure: CONVERSION OF RIGHT HEMI ARTHROPLASTY TO TOTAL HIP ARTHROPLASTY ACETABULAR REVISON ;  Surgeon: Paralee Cancel, MD;  [facility];  Service: Orthopedics;  Laterality: Right;  . EYE SURGERY Bilateral    lens for cataracts  . HIP ARTHROPLASTY Right 03/01/2013   Procedure: ARTHROPLASTY BIPOLAR HIP;  Surgeon: Mauri Pole, MD;  [facility];  Service: Orthopedics;  Laterality: Right;  . HIP CLOSED REDUCTION Right 03/12/2013   Procedure: CLOSED REDUCTION HIP;  Surgeon: Gearlean Alf, MD;  Location: [facility];  Service: Orthopedics;  Laterality: Right;  . HIP CLOSED  REDUCTION Right 03/19/2013   Procedure: CLOSED REDUCTION HIP;  Surgeon: Johnn Hai, MD;  Location: [facility];  Service: Orthopedics;  Laterality: Right;  . HIP CLOSED REDUCTION Right 11/10/2015   Procedure: CLOSED MANIPULATION HIP;  Surgeon: Rod Can, MD;  [facility];  Service: Orthopedics;  Laterality: Right;  . HIP CLOSED REDUCTION Right 06/05/2017   Procedure: CLOSED REDUCTION HIP;  Surgeon: [name], MD;  [facility];  Service: Orthopedics;  Laterality: Right;  . TOTAL HIP REVISION Right 06/06/2017   Procedure: TOTAL HIP REVISION;  Surgeon: [name], MD;  [facility];  Service: Orthopedics;  Laterality: Right;  [facility] Kitchen VESICOVAGINAL FISTULA CLOSURE W/ TAH  2005    There were no vitals filed for this visit.  Subjective Assessment - 05/17/18 1021    Subjective  No new complaitns. No falls or pain to report.     Pertinent History  MS, hypothyroidism, sleep apnea (no longer using CPAP per last sleep study), bladder dysfunction, COPD, asthma, arthritis, osteoporsis    Patient Stated Goals  Walk with my cane again and get R side working again    Currently in Pain?  No/denies         [facility] Adult PT Treatment/Exercise - 05/17/18 1025      Transfers   Transfers  Sit to Stand;Stand to Sit  Sit to Stand  5: Supervision;With upper extremity assist;From chair/3-in-1;From bed    Stand to Sit  5: Supervision;With upper extremity assist;To bed;To chair/3-in-1      Ambulation/Gait   Ambulation/Gait  Yes    Ambulation/Gait Assistance  5: Supervision    Ambulation/Gait Assistance Details  pt with heavy reliance with cues for "soft knees" to decrease recurvatum with stance on bil LE's.     Ambulation Distance (Feet)  65 Feet   x2   Assistive device  Rolling walker    Gait Pattern  Step-through pattern;Decreased stride length;Decreased dorsiflexion - left;Decreased dorsiflexion - right;Right genu recurvatum;Left genu recurvatum;Trunk flexed;Narrow base of support     Ambulation Surface  Level;Indoor      Neuro Re-ed    Neuro Re-ed Details   for strengthening/coordination: quadruped over red pball- allternating UE raises, then alternating leg raises, then alternating combo UE/leg raises x 10 reps each, AA for leg raises at times; tall kneeling with hands on pball: mini squats 2 sets of 10 reps, fwd roll outs for core strength (modified plank) x 10 reps, then moving knee fwd/bwd x 5 reps each side. min guard to min assit for balance/stability with cues on posture and ex form/technique.        Knee/Hip Exercises: Supine   Bridges  AROM;Strengthening;1 set;Both;10 reps;Limitations    Bridges Limitations  figure 4 bridge x 10 reps each side. cues on technique/ex form.     Straight Leg Raises  AROM;Strengthening;Both;1 set;10 reps;Limitations    Straight Leg Raises Limitations  cues for hold time, no physical assistance needed    Other Supine Knee/Hip Exercises  hook lying with green band around knees: hip falls outs x 10 reps each leg with assist needed to stabilize non moving legs. decreased motion noted on right LE vs left LE.       Knee/Hip Exercises: Prone   Hamstring Curl  2 sets;10 reps;Limitations    Hamstring Curl Limitations  assist needed for control and form, 1# ankle weight use on left LE.     Hip Extension  AROM;AAROM;Strengthening;Both;1 set;10 reps;Limitations    Hip Extension Limitations  assistance needed to maintain knee extension, for lift and control. >assist needed with left LE.     Other Prone Exercises  glut kick backs 2 sets of 10 reps AAROM               PT Short Term Goals - 03/25/18 1058      PT SHORT TERM GOAL #1   Title  Pt will be IND in HEP to improve deficits listed above. TARGET DATE FOR ALL STGS: 03/25/18    Status  Achieved      PT SHORT TERM GOAL #2   Title  Pt will improve TUG time with LRAD to </=13.5 sec. to decr. falls risk.     Status  Partially Met      PT SHORT TERM GOAL #3   Title  Pt will amb. 300'  over even terrain, at MOD I level, with LRAD to improve functional mobility.     Status  Achieved      PT SHORT TERM GOAL #4   Title  Pt will improve gait speed to >/=2.28f/sec. with LRAD to decr. falls risk.     Status  Partially Met      PT SHORT TERM GOAL #5   Title  Pt will improve BERG score to >/=34/56 to decr. falls risk.     Status  Achieved  PT Long Term Goals - 04/22/18 1236      PT LONG TERM GOAL #1   Title  Pt will improve gait speed with LRAD to >/=2.39f/sec to safely amb. in the community. TARGET DATE FOR ALL LTGS: 04/22/18 (ALL UNMET LTGS WILL BE CARRIED OVER TO NEW POC: 05/20/18)    Baseline  1.942fsec on 04/20/18    Status  Partially Met      PT LONG TERM GOAL #2   Title  Pt will improve BERG score to >/=46/56 to decr. falls risk.     Baseline  42/56 on 04/15/18    Status  Revised      PT LONG TERM GOAL #3   Title  Pt will amb. 500' over even/uneven terrain with LRAD at MOD I level to improve functional mobility.     Status  Partially Met      PT LONG TERM GOAL #4   Title  Pt will amb. 100' with SPC, at MOD I Level, over even terrain to safely use SPC at home.    Status  Deferred      PT LONG TERM GOAL #5   Title  Pt will be IND in progressed HEP to improve strength, endurance, balance, and flexibility.     Status  New            Plan - 05/17/18 1022    Clinical Impression Statement  Today's skilled session continued to focus on LE/core/proximal hip strengthening with fatigue reported. Fatigue improved with rest breaks.     Rehab Potential  Good    Clinical Impairments Affecting Rehab Potential  see above    PT Frequency  2x / week   original POC: 2x/week for 8 weeks   PT Duration  4 weeks    PT Treatment/Interventions  ADLs/Self Care Home Management;Aquatic Therapy;Biofeedback;Canalith Repostioning;Electrical Stimulation;Therapeutic exercise;Therapeutic activities;Manual techniques;Vestibular;Functional mobility training;Orthotic  Fit/Training;Stair training;Gait training;Patient/family education;DME Instruction;Neuromuscular re-education    PT Next Visit Plan  check on ST order; continue with strengthening; check goals on Mondays visist (cert ends on 3/0/1/31   PT Home Exercise Plan  [id number]    Consulted and Agree with Plan of Care  Patient       Patient will benefit from skilled therapeutic intervention in order to improve the following deficits and impairments:  Abnormal gait, Decreased endurance, Impaired sensation, Decreased knowledge of use of DME, Decreased strength, Impaired tone, Impaired UE functional use, Decreased balance, Decreased mobility, Pain, Decreased range of motion, Postural dysfunction, Impaired flexibility, Decreased coordination(impaired tone per pt, none noted during exam, no pain during session-PT will not treat directly but will monitor closely)  Visit Diagnosis: Muscle weakness (generalized)  Unsteadiness on feet     Problem List Patient Active Problem List   Diagnosis Date Noted  . Ankle pain, right 08/11/2017  . Hypotension 06/07/2017  . 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 Cough [facility]  [facility]  . Positional sleep apnea 09/10/2016  . Periodic limb movements of sleep 09/10/2016  . OSA on CPAP 02/08/2016  . [facility]2017  . Leg swelling 08/21/2014  . Acute URI 04/04/2014  . Health care maintenance 12/16/2013  . Other and unspecified hyperlipidemia 11/04/2013  . 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 S/P hip hemiarthroplasty 03/15/2013  . Dislocation of hip prosthesis ([place]12/20/2014  . Hip dislocation, left ([place]12/20/2014  . Hip dislocation, right ([place]12/20/2014  . Urinary retention with incomplete bladder emptying  03/09/2013  . Dysphagia, pharyngoesophageal phase 03/09/2013  . Femoral neck fracture ([place]) 03/08/2013  . Unspecified constipation 02/26/2013  . Fractured femoral neck ([place])  02/23/2013  . Hip fracture ([place]) 02/23/2013  . Excessive flatus 10/21/2012  . Cold feet 07/29/2012  . Hyponatremia 05/20/2012  . Calf pain 11/17/2011  . Dyspnea 12/06/2007  . Post-menopausal osteoporosis 09/17/2007  . Hypothyroidism 04/01/2007  . Multiple sclerosis ([place]) 04/01/2007  . Other diseases of vocal cords 04/01/2007    Willow Ora, PTA, [facility] Outpatient Neuro [facility] [address], [place], [place] [address] [phone] 05/18/18, 1:40 PM   Name: Kayla [name] MRN: [id number] Date of Birth: [birth year]

## 2018-05-20 ENCOUNTER — Ambulatory Visit: Payer: Medicare Other | Admitting: Physical Therapy

## 2018-05-20 ENCOUNTER — Encounter: Payer: Self-pay | Admitting: Physical Therapy

## 2018-05-20 DIAGNOSIS — R2689 Other abnormalities of gait and mobility: Secondary | ICD-10-CM

## 2018-05-20 DIAGNOSIS — R278 Other lack of coordination: Secondary | ICD-10-CM | POA: Diagnosis not present

## 2018-05-20 DIAGNOSIS — R2681 Unsteadiness on feet: Secondary | ICD-10-CM | POA: Diagnosis not present

## 2018-05-20 DIAGNOSIS — R293 Abnormal posture: Secondary | ICD-10-CM | POA: Diagnosis not present

## 2018-05-20 DIAGNOSIS — M9903 Segmental and somatic dysfunction of lumbar region: Secondary | ICD-10-CM | POA: Diagnosis not present

## 2018-05-20 DIAGNOSIS — M9901 Segmental and somatic dysfunction of cervical region: Secondary | ICD-10-CM | POA: Diagnosis not present

## 2018-05-20 DIAGNOSIS — M40292 Other kyphosis, cervical region: Secondary | ICD-10-CM | POA: Diagnosis not present

## 2018-05-20 DIAGNOSIS — M6281 Muscle weakness (generalized): Secondary | ICD-10-CM | POA: Diagnosis not present

## 2018-05-20 DIAGNOSIS — M50322 Other cervical disc degeneration at C5-C6 level: Secondary | ICD-10-CM | POA: Diagnosis not present

## 2018-05-21 NOTE — Therapy (Signed)
[facility] [address] [place] [place], [place], [address] Phone: [phone]   Fax:  [phone]  Physical Therapy Treatment  Patient Details  Name: Kayla [name] MRN: [id number] Date of Birth: 27-Mar-1950 Referring Provider (PT): Dr. Almedia Balls   Encounter Date: 05/20/2018  PT End of Session - 05/20/18 1151    Visit Number  19    Number of Visits  25   per updated POC   Date for PT Re-Evaluation  05/24/18   per updated POC   Authorization Type  Medicare primary and BCBS secondary    PT Start Time  1149    PT Stop Time  1231    PT Time Calculation (min)  42 min    Activity Tolerance  Patient tolerated treatment well;No increased pain    Behavior During Therapy  WFL for tasks assessed/performed       Past Medical History:  Diagnosis Date  . Arthritis    oa  . Cervical cancer ([place] [place]) 2005  . COPD (chronic obstructive pulmonary disease) ([place])   . Multiple sclerosis ([place])   . Osteoporosis   . Other diseases of vocal cords    vocal cords are not even  . Other voice and resonance disorders   . Pneumonia 2014, 1992  . Sleep apnea    no cpap used  . Unspecified hypothyroidism     Past Surgical History:  Procedure Laterality Date  . ABDOMINAL HYSTERECTOMY  2005   for cervical cancer, complete  . CONVERSION TO TOTAL HIP Right 09/18/2015   Procedure: CONVERSION OF RIGHT HEMI ARTHROPLASTY TO TOTAL HIP ARTHROPLASTY ACETABULAR REVISON ;  Surgeon: Paralee Cancel, MD;  [facility]: [facility];  Service: Orthopedics;  Laterality: Right;  . EYE SURGERY Bilateral    lens for cataracts  . HIP ARTHROPLASTY Right 03/01/2013   Procedure: ARTHROPLASTY BIPOLAR HIP;  Surgeon: Mauri Pole, MD;  [facility];  Service: Orthopedics;  Laterality: Right;  . HIP CLOSED REDUCTION Right 03/12/2013   Procedure: CLOSED REDUCTION HIP;  Surgeon: Gearlean Alf, MD;  Location: [facility];  Service: Orthopedics;  Laterality: Right;  . HIP CLOSED  REDUCTION Right 03/19/2013   Procedure: CLOSED REDUCTION HIP;  Surgeon: Johnn Hai, MD;  Location: [facility];  Service: Orthopedics;  Laterality: Right;  . HIP CLOSED REDUCTION Right 11/10/2015   Procedure: CLOSED MANIPULATION HIP;  Surgeon: Rod Can, MD;  [facility];  Service: Orthopedics;  Laterality: Right;  . HIP CLOSED REDUCTION Right 06/05/2017   Procedure: CLOSED REDUCTION HIP;  Surgeon: [name], MD;  [facility];  Service: Orthopedics;  Laterality: Right;  . TOTAL HIP REVISION Right 06/06/2017   Procedure: TOTAL HIP REVISION;  Surgeon: [name], MD;  [facility];  Service: Orthopedics;  Laterality: Right;  [facility] Kitchen VESICOVAGINAL FISTULA CLOSURE W/ TAH  2005    There were no vitals filed for this visit.  Subjective Assessment - 05/20/18 1151    Subjective  No new complaitns. No falls or pain to report.     Pertinent History  MS, hypothyroidism, sleep apnea (no longer using CPAP per last sleep study), bladder dysfunction, COPD, asthma, arthritis, osteoporsis    Patient Stated Goals  Walk with my cane again and get R side working again    Currently in Pain?  No/denies    Pain Score  0-No pain              [facility] Adult PT Treatment/Exercise - 05/20/18 1152  Neuro Re-ed    Neuro Re-ed Details   for strengthening/coordination: in tall kneeling with hands on red pball- mini squats x 10 reps with emphasis on equal LE weight bearing, then rolling ball fwd/out concurrent with mini squat x 10 reps, then rolling ball out laterally (alt sides) concurrent with minisquat x 10 reps each side, then rolling the ball oout for modified plank for 10 reps. assist needed at pelvis for control and balance.                           Knee/Hip Exercises: Aerobic   Other Aerobic  Scifit level 2.0 with UE/LE's for 5 minutes for strenghtening/activity tolerance       Knee/Hip Exercises: Supine   Bridges  AROM;Strengthening;1 set;Both;10 reps;Limitations   see figure 4  details   Bridges Limitations  figure 4 bridge x 10 reps each side. cues on technique/ex form.     Straight Leg Raises  AROM;Strengthening;Both;1 set;10 reps;Limitations    Straight Leg Raises Limitations  cues for hold time, no physical assistance needed    Other Supine Knee/Hip Exercises  hook lying with green band around knees: hip falls outs x 10 reps each leg with assist needed to stabilize non moving legs. decreased motion noted on right LE vs left LE.       Knee/Hip Exercises: Prone   Hamstring Curl  2 sets;10 reps;Limitations    Hamstring Curl Limitations  assist needed for control and form, 1# ankle weight use on left LE.            PT Short Term Goals - 03/25/18 1058      PT SHORT TERM GOAL #1   Title  Pt will be IND in HEP to improve deficits listed above. TARGET DATE FOR ALL STGS: 03/25/18    Status  Achieved      PT SHORT TERM GOAL #2   Title  Pt will improve TUG time with LRAD to </=13.5 sec. to decr. falls risk.     Status  Partially Met      PT SHORT TERM GOAL #3   Title  Pt will amb. 300' over even terrain, at MOD I level, with LRAD to improve functional mobility.     Status  Achieved      PT SHORT TERM GOAL #4   Title  Pt will improve gait speed to >/=2.1f/sec. with LRAD to decr. falls risk.     Status  Partially Met      PT SHORT TERM GOAL #5   Title  Pt will improve BERG score to >/=34/56 to decr. falls risk.     Status  Achieved        PT Long Term Goals - 04/22/18 1236      PT LONG TERM GOAL #1   Title  Pt will improve gait speed with LRAD to >/=2.631fsec to safely amb. in the community. TARGET DATE FOR ALL LTGS: 04/22/18 (ALL UNMET LTGS WILL BE CARRIED OVER TO NEW POC: 05/20/18)    Baseline  1.9172fec on 04/20/18    Status  Partially Met      PT LONG TERM GOAL #2   Title  Pt will improve BERG score to >/=46/56 to decr. falls risk.     Baseline  42/56 on 04/15/18    Status  Revised      PT LONG TERM GOAL #3   Title  Pt will amb. 500' over  even/uneven terrain with  LRAD at MOD I level to improve functional mobility.     Status  Partially Met      PT LONG TERM GOAL #4   Title  Pt will amb. 100' with SPC, at MOD I Level, over even terrain to safely use SPC at home.    Status  Deferred      PT LONG TERM GOAL #5   Title  Pt will be IND in progressed HEP to improve strength, endurance, balance, and flexibility.     Status  New            Plan - 05/20/18 1151    Clinical Impression Statement  Today's skilled session continued to focus on LE/core strengthening with no issues reported other than fatigue. Rest breaks taken throughout the session. Pt should have her new brace by next appt on Monday.     Rehab Potential  Good    Clinical Impairments Affecting Rehab Potential  see above    PT Frequency  2x / week   original POC: 2x/week for 8 weeks   PT Duration  4 weeks    PT Treatment/Interventions  ADLs/Self Care Home Management;Aquatic Therapy;Biofeedback;Canalith Repostioning;Electrical Stimulation;Therapeutic exercise;Therapeutic activities;Manual techniques;Vestibular;Functional mobility training;Orthotic Fit/Training;Stair training;Gait training;Patient/family education;DME Instruction;Neuromuscular re-education    PT Next Visit Plan  check on ST order; continue with strengthening; check goals on Mondays visist (cert ends on 05/24/18)    PT Home Exercise Plan  [email or web address]    Consulted and Agree with Plan of Care  Patient       Patient will benefit from skilled therapeutic intervention in order to improve the following deficits and impairments:  Abnormal gait, Decreased endurance, Impaired sensation, Decreased knowledge of use of DME, Decreased strength, Impaired tone, Impaired UE functional use, Decreased balance, Decreased mobility, Pain, Decreased range of motion, Postural dysfunction, Impaired flexibility, Decreased coordination(impaired tone per pt, none noted during exam, no pain during session-PT will not treat directly but  will monitor closely)  Visit Diagnosis: Muscle weakness (generalized)  Unsteadiness on feet  Other abnormalities of gait and mobility     Problem List Patient Active Problem List   Diagnosis Date Noted  . Ankle pain, right 08/11/2017  . Hypotension 06/07/2017  . Failed total hip arthroplasty with dislocation (HCC) 06/05/2017  . Cough variant asthma vs vcd  related to MS  11/10/2016  . Positional sleep apnea 09/10/2016  . Periodic limb movements of sleep 09/10/2016  . OSA on CPAP 02/08/2016  . S/P revision right TH 09/18/2015  . Leg swelling 08/21/2014  . Acute URI 04/04/2014  . Health care maintenance 12/16/2013  . Other and unspecified hyperlipidemia 11/04/2013  . 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 S/P hip hemiarthroplasty 03/15/2013  . Dislocation of hip prosthesis (HCC) 03/12/2013  . Hip dislocation, left ([facility]CC) 03/12/2013  . Hip dislocation, right ([facility]CC) 03/12/2013  . Urinary retention with incomplete bladder emptying 03/09/2013  . Dysphagia, pharyngoesophageal phase 03/09/2013  . Femoral neck fracture (HCC) 03/08/2013  . Unspecified constipation 02/26/2013  . Fractured femoral neck (HCC) 02/23/2013  . Hip fracture (HCC) 02/23/2013  . Excessive flatus 10/21/2012  . Cold feet 07/29/2012  . Hyponatremia 05/20/2012  . Calf pain 11/17/2011  . Dyspnea 12/06/2007  . Post-menopausal osteoporosis 09/17/2007  . Hypothyroidism 04/01/2007  . Multiple sclerosis (HCC) 04/01/2007  . Other diseases of vocal cords 04/01/2007    Kathy Bury, PTA, CLT [facility] [address], [address] [place], [place] [address] [phone] 05/21/18, 3:55 PM   Name: Virginia H Papadopoulos MRN:   [id number] Date of Birth: 05/18/1950   

## 2018-05-24 ENCOUNTER — Ambulatory Visit: Payer: Medicare Other | Admitting: Rehabilitation

## 2018-05-24 ENCOUNTER — Encounter: Payer: Self-pay | Admitting: Occupational Therapy

## 2018-05-24 ENCOUNTER — Ambulatory Visit: Payer: Medicare Other | Attending: Orthopedic Surgery | Admitting: Occupational Therapy

## 2018-05-24 ENCOUNTER — Encounter: Payer: Self-pay | Admitting: Rehabilitation

## 2018-05-24 DIAGNOSIS — M6281 Muscle weakness (generalized): Secondary | ICD-10-CM

## 2018-05-24 DIAGNOSIS — R2681 Unsteadiness on feet: Secondary | ICD-10-CM | POA: Diagnosis not present

## 2018-05-24 DIAGNOSIS — R2689 Other abnormalities of gait and mobility: Secondary | ICD-10-CM

## 2018-05-24 DIAGNOSIS — R278 Other lack of coordination: Secondary | ICD-10-CM | POA: Insufficient documentation

## 2018-05-24 DIAGNOSIS — R293 Abnormal posture: Secondary | ICD-10-CM | POA: Diagnosis not present

## 2018-05-24 NOTE — Patient Instructions (Signed)
Access Code: [email or web address]  URL: [email or web address]  Date: 05/24/2018  Prepared by: Cameron Sprang   Exercises  Sit to Stand - 10 reps - 2 sets - 1x daily - 5x weekly  Standing March with Counter Support - 10 reps - 3 sets - 1x daily - 3-4x weekly  Heel rises with counter support - 10 reps - 3 sets - 2 hold - 1x daily - 3-4x weekly  Romberg Stance with Head Nods - 10 reps - 1 sets - 1x daily - 5x weekly  Romberg Stance with Eyes Closed - 3 reps - 20 hold - 1x daily - 5x weekly  Clamshell - 10 reps - 3 sets - 1x daily - 3x weekly  Side Stepping with Counter Support - 4 reps - 1 sets - 1x daily - 5x weekly  Bent Knee Fallouts - 10 reps - 3 sets - 1x daily - 3x weekly  Standing Terminal Knee Extension with Resistance - 10 reps - 2-3 sets - 1-2 hold - 1x daily - 3x weekly  Prone Knee Flexion - 5 reps - 2 sets - 1x daily - 3x weekly  Supine Hip Flexor Stretch with Weight - 10 reps - 1-2 sets - 60-120 sec. hold - 2-3x daily - 7x weekly  Figure 4 Bridge - 5 reps - 2 sets - 1-2x daily - 3-4x weekly  Quadruped Alternating Leg Extensions - 5 reps - 2 sets - 1x daily - 7x weekly  Bird Dog - 5 reps - 2 sets - 1x daily - 7x weekly  Tall Kneeling Hip Hinge - 5 reps - 2 sets - 1x daily - 7x weekly

## 2018-05-24 NOTE — Therapy (Signed)
[facility] [address] [place], [place], [address] Phone: [phone]   Fax:  [phone]  Occupational Therapy Treatment  Patient Details  Name: Kayla [name] MRN: [id number] Date of Birth: [birth year] No data recorded  Encounter Date: 05/24/2018  OT End of Session - 05/24/18 1023    Visit Number  8    Number of Visits  9    Date for OT Re-Evaluation  05/15/18    Authorization Type  Medicare, BCBS--covered 100%    Authorization Time Period  cert. 04/15/18-07/14/18    Authorization - Visit Number  8    Authorization - Number of Visits  10    OT Start Time  1020    OT Stop Time  1100    OT Time Calculation (min)  40 min    Activity Tolerance  Patient tolerated treatment well    Behavior During Therapy  WFL for tasks assessed/performed       Past Medical History:  Diagnosis Date  . Arthritis    oa  . Cervical cancer ([facility]) 2005  . COPD (chronic obstructive pulmonary disease) ([facility])   . Multiple sclerosis ([facility])   . Osteoporosis   . Other diseases of vocal cords    vocal cords are not even  . Other voice and resonance disorders   . Pneumonia 2014, 1992  . Sleep apnea    no cpap used  . Unspecified hypothyroidism     Past Surgical History:  Procedure Laterality Date  . ABDOMINAL HYSTERECTOMY  2005   for cervical cancer, complete  . CONVERSION TO TOTAL HIP Right 09/18/2015   Procedure: CONVERSION OF RIGHT HEMI ARTHROPLASTY TO TOTAL HIP ARTHROPLASTY ACETABULAR REVISON ;  Surgeon: Paralee Cancel, MD;  [facility];  Service: Orthopedics;  Laterality: Right;  . EYE SURGERY Bilateral    lens for cataracts  . HIP ARTHROPLASTY Right 03/01/2013   Procedure: ARTHROPLASTY BIPOLAR HIP;  Surgeon: Mauri Pole, MD;  [facility];  Service: Orthopedics;  Laterality: Right;  . HIP CLOSED REDUCTION Right 03/12/2013   Procedure: CLOSED REDUCTION HIP;  Surgeon: Gearlean Alf, MD;  Location: [facility];  Service:  Orthopedics;  Laterality: Right;  . HIP CLOSED REDUCTION Right 03/19/2013   Procedure: CLOSED REDUCTION HIP;  Surgeon: Johnn Hai, MD;  Location: [facility];  Service: Orthopedics;  Laterality: Right;  . HIP CLOSED REDUCTION Right 11/10/2015   Procedure: CLOSED MANIPULATION HIP;  Surgeon: Rod Can, MD;  [facility];  Service: Orthopedics;  Laterality: Right;  . HIP CLOSED REDUCTION Right 06/05/2017   Procedure: CLOSED REDUCTION HIP;  Surgeon: [name], MD;  [facility];  Service: Orthopedics;  Laterality: Right;  . TOTAL HIP REVISION Right 06/06/2017   Procedure: TOTAL HIP REVISION;  Surgeon: [name], MD;  [facility];  Service: Orthopedics;  Laterality: Right;  Mar[facility] Kitchen VESICOVAGINAL FISTULA CLOSURE W/ TAH  2005    There were no vitals filed for this visit.  Subjective Assessment - 05/24/18 1022    Subjective   Pt reports new R knee brace.  Was able to put on necklace herself, able to retrieve dishes from overhead    Pertinent History  MS.    PMH:  hx of R shoulder fx 11/2016, hypothyroidism, osteoporosis, hx of R hip dislocation/fx and multiple surgeries, hx of R achilles rupture, positional sleep apnea, hypotension, hx of cervical CA, arthritis, COPD    Patient Stated Goals  improve R shoulder strength/ROM, be able to  return to working out at gym    Currently in Pain?  No/denies        In sitting, cane ex for shoulder flex and abduction x20.  In standing, shoulder ext, scapular retraction x20.    In modified quadruped, forward/backward wt. Shifts, alternating UE lifts, alternating shoulder abduction with trunk rotation for incr scapular stability/UE strength.  In standing, functional reaching with RUE to place/remove clothespins with wt. Shift and trunk rotation, lateral and overhead reaching.   In prone, scapular retraction with shoulders in extension and abduction.    Arm bike x6 min level 3 for reciprocal movement/conditioning without rest  (forward/backwards).       OT Education - 05/24/18 1046    Education Details  updated theraband HEP to red    Person(s) Educated  Patient    Methods  Explanation;Demonstration;Verbal cues    Comprehension  Verbalized understanding;Returned demonstration          OT Long Term Goals - 05/12/18 0959      OT LONG TERM GOAL #1   Title  Pt will be independent with HEP for RUE ROM/strength.--check goals 05/16/18    Status  Achieved      OT LONG TERM GOAL #2   Title  Pt will be able to lift/retrieve 2lb object using at least 115* shoulder flex with RUE safely x3.    Status  On-going      OT LONG TERM GOAL #3   Title  Pt will verbalize understanding of appropriate community fitness activities and adaptions for activities prn.    Status  Achieved   05/12/18:  discussed ACT (with trainer set-up), Silver Sneakers, Gerty #4   Title  Pt will be able to demo at least 125* R shoulder flex without compensation for ADLs/IADLs.    Baseline  110*    Status  On-going   05/12/18:  115*      OT LONG TERM GOAL #5   Title  Pt will demo at least 120* R shoulder abduction for ADLs/IADLs.    Baseline  110*    Status  On-going   05/12/18:  115*           Plan - 05/24/18 1023    Clinical Impression Statement  Pt is progressing towards goals with improving RUE strength and ROM.  Pt tolerating strengthing well    Occupational Profile and client history currently impacting functional performance  Pt is mod I with ADLs/IADLs.  However, pt reports difficulty with overhead reaching tasks and would like to be able to return to community fitness activities.    Occupational performance deficits (Please refer to evaluation for details):  ADL's;IADL's;Leisure;Social Participation    Rehab Potential  Good    OT Frequency  2x / week    OT Duration  4 weeks    OT Treatment/Interventions  Self-care/ADL training;Therapeutic exercise;Patient/family education;Neuromuscular  education;Moist Heat;Aquatic Therapy;Energy conservation;Therapist, nutritional;Therapeutic activities;Passive range of motion;Manual Therapy;DME and/or AE instruction;Cryotherapy    Plan  check goals, anticipate d/c    Consulted and Agree with Plan of Care  Patient       Patient will benefit from skilled therapeutic intervention in order to improve the following deficits and impairments:     Visit Diagnosis: Muscle weakness (generalized)  Unsteadiness on feet  Other abnormalities of gait and mobility  Other lack of coordination  Abnormal posture    Problem List Patient Active Problem List   Diagnosis Date  Noted  . Ankle pain, right 08/11/2017  . Hypotension 06/07/2017  . Failed total hip arthroplasty with dislocation ([facility]) 06/05/2017  . Cough variant asthma vs vcd  related to [facility]  11/10/2016  . Positional sleep apnea 09/10/2016  . Periodic limb movements of sleep 09/10/2016  . OSA on CPAP 02/08/2016  . S/P revision right [facility] 09/18/2015  . Leg swelling 08/21/2014  . Acute URI 04/04/2014  . Health care maintenance 12/16/2013  . Other and unspecified hyperlipidemia 11/04/2013  . 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 S/P hip hemiarthroplasty 03/15/2013  . Dislocation of hip prosthesis ([place]) 03/12/2013  . Hip dislocation, left ([facility]) 03/12/2013  . Hip dislocation, right ([place]) 03/12/2013  . Urinary retention with incomplete bladder emptying 03/09/2013  . Dysphagia, pharyngoesophageal phase 03/09/2013  . Femoral neck fracture ([place]) 03/08/2013  . Unspecified constipation 02/26/2013  . Fractured femoral neck ([place]) 02/23/2013  . Hip fracture ([place]) 02/23/2013  . Excessive flatus 10/21/2012  . Cold feet 07/29/2012  . Hyponatremia 05/20/2012  . Calf pain 11/17/2011  . Dyspnea 12/06/2007  . Post-menopausal osteoporosis 09/17/2007  . Hypothyroidism 04/01/2007  . Multiple sclerosis ([place]) 04/01/2007  . Other diseases of vocal cords 04/01/2007    [facility] 05/24/2018, 10:49  AM  [facility] [address] [place], [place], [address] Phone: [phone]   Fax:  [phone]  Name: Kayla [name] MRN: [id number] Date of Birth: 1950/07/09   Vianne Bulls, OTR/L [facility] [address] [place], [place]  [address] [phone] phone [phone] 05/24/18 10:49 AM

## 2018-05-24 NOTE — Therapy (Signed)
[facility] [address] [place], [place], [address] Phone: [phone]   Fax:  [phone]  Physical Therapy Treatment and D/C Summary   Patient Details  Name: Kayla [name] MRN: [id number] Date of Birth: 1951-02-13 Referring Provider (PT): Dr. Almedia Balls   Encounter Date: 05/24/2018  PT End of Session - 05/24/18 1434    Visit Number  20    Number of Visits  25   per updated POC   Date for PT Re-Evaluation  05/24/18   per updated POC   Authorization Type  Medicare primary and BCBS secondary    PT Start Time  1101    PT Stop Time  1145    PT Time Calculation (min)  44 min    Activity Tolerance  Patient tolerated treatment well;No increased pain    Behavior During Therapy  WFL for tasks assessed/performed       Past Medical History:  Diagnosis Date  . Arthritis    oa  . Cervical cancer ([place]) 2005  . COPD (chronic obstructive pulmonary disease) ([place])   . Multiple sclerosis ([place])   . Osteoporosis   . Other diseases of vocal cords    vocal cords are not even  . Other voice and resonance disorders   . Pneumonia 2014, 1992  . Sleep apnea    no cpap used  . Unspecified hypothyroidism     Past Surgical History:  Procedure Laterality Date  . ABDOMINAL HYSTERECTOMY  2005   for cervical cancer, complete  . CONVERSION TO TOTAL HIP Right 09/18/2015   Procedure: CONVERSION OF RIGHT HEMI ARTHROPLASTY TO TOTAL HIP ARTHROPLASTY ACETABULAR REVISON ;  Surgeon: Paralee Cancel, MD;  [facility];  Service: Orthopedics;  Laterality: Right;  . EYE SURGERY Bilateral    lens for cataracts  . HIP ARTHROPLASTY Right 03/01/2013   Procedure: ARTHROPLASTY BIPOLAR HIP;  Surgeon: Mauri Pole, MD;  [facility];  Service: Orthopedics;  Laterality: Right;  . HIP CLOSED REDUCTION Right 03/12/2013   Procedure: CLOSED REDUCTION HIP;  Surgeon: Gearlean Alf, MD;  Location: [facility];  Service: Orthopedics;  Laterality: Right;  .  HIP CLOSED REDUCTION Right 03/19/2013   Procedure: CLOSED REDUCTION HIP;  Surgeon: Johnn Hai, MD;  Location: [facility];  Service: Orthopedics;  Laterality: Right;  . HIP CLOSED REDUCTION Right 11/10/2015   Procedure: CLOSED MANIPULATION HIP;  Surgeon: Rod Can, MD;  [facility];  Service: Orthopedics;  Laterality: Right;  . HIP CLOSED REDUCTION Right 06/05/2017   Procedure: CLOSED REDUCTION HIP;  Surgeon: [name], MD;  [facility];  Service: Orthopedics;  Laterality: Right;  . TOTAL HIP REVISION Right 06/06/2017   Procedure: TOTAL HIP REVISION;  Surgeon: [name], MD;  [facility];  Service: Orthopedics;  Laterality: Right;  [facility] VESICOVAGINAL FISTULA CLOSURE W/ TAH  2005    There were no vitals filed for this visit.  Subjective Assessment - 05/24/18 1102    Subjective  Pt got knee cage.  PT did adjust lower strap to be smaller.      Pertinent History  MS, hypothyroidism, sleep apnea (no longer using CPAP per last sleep study), bladder dysfunction, COPD, asthma, arthritis, osteoporsis    Patient Stated Goals  Walk with my cane again and get R side working again    Currently in Pain?  No/denies  [facility] Adult PT Treatment/Exercise - 05/24/18 1113      Transfers   Transfers  Sit to Stand;Stand to Sit    Sit to Stand  5: Supervision    Sit to Stand Details  Verbal cues for sequencing;Verbal cues for technique    Sit to Stand Details (indicate cue type and reason)  Continues to need cues for ensuring R LE alignment.     Stand to Sit  5: Supervision;With upper extremity assist;To bed;To chair/3-in-1      Ambulation/Gait   Ambulation/Gait  Yes    Ambulation/Gait Assistance  6: Modified independent (Device/Increase time)    Ambulation/Gait Assistance Details  Pt able to ambulate outdoors over unlevel paved surfaces at mod I level with RW and R knee cage today.  Heavy reliance on UE support, esp when fatigued.  Pt does well  increasing attention to placement of RLE    Ambulation Distance (Feet)  500 Feet    Assistive device  Rolling walker    Gait Pattern  Step-through pattern;Decreased stride length;Decreased dorsiflexion - left;Decreased dorsiflexion - right;Right genu recurvatum;Left genu recurvatum;Trunk flexed;Narrow base of support    Ambulation Surface  Level;Unlevel;Outdoor;Paved    Gait velocity  1.79 ft/sec with RW      Standardized Balance Assessment   Standardized Balance Assessment  Berg Balance Test      Berg Balance Test   Sit to Stand  Able to stand  independently using hands    Standing Unsupported  Able to stand safely 2 minutes    Sitting with Back Unsupported but Feet Supported on Floor or Stool  Able to sit safely and securely 2 minutes    Stand to Sit  Sits safely with minimal use of hands    Transfers  Able to transfer safely, definite need of hands    Standing Unsupported with Eyes Closed  Able to stand 10 seconds with supervision    Standing Ubsupported with Feet Together  Needs help to attain position but able to stand for 30 seconds with feet together    From Standing, Reach Forward with Outstretched Arm  Can reach confidently >25 cm (10")    From Standing Position, Pick up Object from Floor  Able to pick up shoe, needs supervision    From Standing Position, Turn to Look Behind Over each Shoulder  Turn sideways only but maintains balance    Turn 360 Degrees  Needs close supervision or verbal cueing    Standing Unsupported, Alternately Place Feet on Step/Stool  Able to complete >2 steps/needs minimal assist    Standing Unsupported, One Foot in Front  Able to take small step independently and hold 30 seconds    Standing on One Leg  Tries to lift leg/unable to hold 3 seconds but remains standing independently    Total Score  36      Neuro Re-ed    Neuro Re-ed Details   Added quadruped over physioball tasks to HEP as we have performed in a few sessions.  Did not perform in session due  to time constraint, but pt felt comfortable performing. See pt instruction for details.           Access Code: [email or web address]  URL: [email or web address]  Date: 05/24/2018  Prepared by: Cameron Sprang   Exercises  Sit to Stand - 10 reps - 2 sets - 1x daily - 5x weekly  Standing March with Counter Support - 10 reps - 3 sets - 1x daily - 3-4x weekly  Heel rises with  counter support - 10 reps - 3 sets - 2 hold - 1x daily - 3-4x weekly  Romberg Stance with Head Nods - 10 reps - 1 sets - 1x daily - 5x weekly  Romberg Stance with Eyes Closed - 3 reps - 20 hold - 1x daily - 5x weekly  Clamshell - 10 reps - 3 sets - 1x daily - 3x weekly  Side Stepping with Counter Support - 4 reps - 1 sets - 1x daily - 5x weekly  Bent Knee Fallouts - 10 reps - 3 sets - 1x daily - 3x weekly  Standing Terminal Knee Extension with Resistance - 10 reps - 2-3 sets - 1-2 hold - 1x daily - 3x weekly  Prone Knee Flexion - 5 reps - 2 sets - 1x daily - 3x weekly  Supine Hip Flexor Stretch with Weight - 10 reps - 1-2 sets - 60-120 sec. hold - 2-3x daily - 7x weekly  Figure 4 Bridge - 5 reps - 2 sets - 1-2x daily - 3-4x weekly  Quadruped Alternating Leg Extensions - 5 reps - 2 sets - 1x daily - 7x weekly  Bird Dog - 5 reps - 2 sets - 1x daily - 7x weekly  Tall Kneeling Hip Hinge - 5 reps - 2 sets - 1x daily - 7x weekly       PT Short Term Goals - 03/25/18 1058      PT SHORT TERM GOAL #1   Title  Pt will be IND in HEP to improve deficits listed above. TARGET DATE FOR ALL STGS: 03/25/18    Status  Achieved      PT SHORT TERM GOAL #2   Title  Pt will improve TUG time with LRAD to </=13.5 sec. to decr. falls risk.     Status  Partially Met      PT SHORT TERM GOAL #3   Title  Pt will amb. 300' over even terrain, at MOD I level, with LRAD to improve functional mobility.     Status  Achieved      PT SHORT TERM GOAL #4   Title  Pt will improve gait speed to >/=2.27f/sec. with LRAD to decr. falls risk.      Status  Partially Met      PT SHORT TERM GOAL #5   Title  Pt will improve BERG score to >/=34/56 to decr. falls risk.     Status  Achieved        PT Long Term Goals - 05/24/18 1103      PT LONG TERM GOAL #1   Title  Pt will improve gait speed with LRAD to >/=2.641fsec to safely amb. in the community. TARGET DATE FOR ALL LTGS: 04/22/18 (ALL UNMET LTGS WILL BE CARRIED OVER TO NEW POC: 05/20/18)    Baseline  1.9181fec on 04/20/18 to 1.79 ft/sec on 05/24/18 (note that this is first session/day with knee cage)    Status  Not Met      PT LONG TERM GOAL #2   Title  Pt will improve BERG score to >/=46/56 to decr. falls risk.     Baseline  42/56 on 04/15/18, demonstrated decline to 36/56 on 05/24/18 (feel that this is partly due to knee cage and not being able to lock knee for stability).     Status  Not Met      PT LONG TERM GOAL #3   Title  Pt will amb. 500' over even/uneven terrain with LRAD at MOD I  level to improve functional mobility.     Baseline  met with RW and R knee cage on 05/24/18    Status  Achieved      PT LONG TERM GOAL #4   Title  Pt will amb. 100' with SPC, at MOD I Level, over even terrain to safely use SPC at home.    Status  Deferred      PT LONG TERM GOAL #5   Title  Pt will be IND in progressed HEP to improve strength, endurance, balance, and flexibility.     Status  Achieved         PHYSICAL THERAPY DISCHARGE SUMMARY  Visits from Start of Care: 20  Current functional level related to goals / functional outcomes: See LTGs above   Remaining deficits: Pt continues to have R>L weakness due to MS, however we have provided her with HEP to address balance and strength and now has knee cage to decrease forceful R knee hyperextension and protecting knees.    Education / Equipment: HEP and knee cage   Plan: Patient agrees to discharge.  Patient goals were partially met. Patient is being discharged due to meeting the stated rehab goals.  ?????         Plan -  05/24/18 1434    Clinical Impression Statement  Skilled session focused on assessment of LTGs and D/C.  Pt has her R Swedish knee cage and therefore assessed goals with this donned.  Feel that some of her decline in balance scores and gait speed may be due to adjusting to knee cage as today is first real day wearing.  Pt is used to being able to lock out LEs for stability.  Educated pt to work on mobility at home, but if she feels there is a decline in function after 6 months of so, that she could get another order to return.     Rehab Potential  Good    Clinical Impairments Affecting Rehab Potential  see above    PT Frequency  2x / week   original POC: 2x/week for 8 weeks   PT Duration  4 weeks    PT Treatment/Interventions  ADLs/Self Care Home Management;Aquatic Therapy;Biofeedback;Canalith Repostioning;Electrical Stimulation;Therapeutic exercise;Therapeutic activities;Manual techniques;Vestibular;Functional mobility training;Orthotic Fit/Training;Stair training;Gait training;Patient/family education;DME Instruction;Neuromuscular re-education    PT Home Exercise Plan  [email or web address]    Consulted and Agree with Plan of Care  Patient       Patient will benefit from skilled therapeutic intervention in order to improve the following deficits and impairments:  Abnormal gait, Decreased endurance, Impaired sensation, Decreased knowledge of use of DME, Decreased strength, Impaired tone, Impaired UE functional use, Decreased balance, Decreased mobility, Pain, Decreased range of motion, Postural dysfunction, Impaired flexibility, Decreased coordination(impaired tone per pt, none noted during exam, no pain during session-PT will not treat directly but will monitor closely)  Visit Diagnosis: Muscle weakness (generalized)  Unsteadiness on feet  Other abnormalities of gait and mobility     Problem List Patient Active Problem List   Diagnosis Date Noted  . Ankle pain, right 08/11/2017  . Hypotension  06/07/2017  . Failed total hip arthroplasty with dislocation ([place]) 06/05/2017  . Cough variant asthma vs vcd  related to [facility]  11/10/2016  . Positional sleep apnea 09/10/2016  . Periodic limb movements of sleep 09/10/2016  . OSA on CPAP 02/08/2016  . S/P revision right [facility] 09/18/2015  . Leg swelling 08/21/2014  . Acute URI 04/04/2014  . Health care maintenance 12/16/2013  .  Other and unspecified hyperlipidemia 11/04/2013  . 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 S/P hip hemiarthroplasty 03/15/2013  . Dislocation of hip prosthesis ([place]) 03/12/2013  . Hip dislocation, left ([place]) 03/12/2013  . Hip dislocation, right ([place]) 03/12/2013  . Urinary retention with incomplete bladder emptying 03/09/2013  . Dysphagia, pharyngoesophageal phase 03/09/2013  . Femoral neck fracture ([place]) 03/08/2013  . Unspecified constipation 02/26/2013  . Fractured femoral neck ([place]) 02/23/2013  . Hip fracture ([place]) 02/23/2013  . Excessive flatus 10/21/2012  . Cold feet 07/29/2012  . Hyponatremia 05/20/2012  . Calf pain 11/17/2011  . Dyspnea 12/06/2007  . Post-menopausal osteoporosis 09/17/2007  . Hypothyroidism 04/01/2007  . Multiple sclerosis ([place]) 04/01/2007  . Other diseases of vocal cords 04/01/2007    Cameron Sprang, PT, MPT [facility] [address] [place], [place], [address] Phone: [phone]   Fax:  [phone] 05/24/18, 2:39 PM  Name: KAREEMAH GROUNDS MRN: [id number] Date of Birth: Oct 12, 1950

## 2018-05-26 DIAGNOSIS — M50322 Other cervical disc degeneration at C5-C6 level: Secondary | ICD-10-CM | POA: Diagnosis not present

## 2018-05-26 DIAGNOSIS — M9903 Segmental and somatic dysfunction of lumbar region: Secondary | ICD-10-CM | POA: Diagnosis not present

## 2018-05-26 DIAGNOSIS — M40292 Other kyphosis, cervical region: Secondary | ICD-10-CM | POA: Diagnosis not present

## 2018-05-26 DIAGNOSIS — M9901 Segmental and somatic dysfunction of cervical region: Secondary | ICD-10-CM | POA: Diagnosis not present

## 2018-05-27 ENCOUNTER — Ambulatory Visit: Payer: Medicare Other | Admitting: Physical Therapy

## 2018-05-27 ENCOUNTER — Ambulatory Visit: Payer: Medicare Other | Admitting: Occupational Therapy

## 2018-05-27 DIAGNOSIS — R2681 Unsteadiness on feet: Secondary | ICD-10-CM | POA: Diagnosis not present

## 2018-05-27 DIAGNOSIS — M6281 Muscle weakness (generalized): Secondary | ICD-10-CM

## 2018-05-27 DIAGNOSIS — R278 Other lack of coordination: Secondary | ICD-10-CM | POA: Diagnosis not present

## 2018-05-27 DIAGNOSIS — R293 Abnormal posture: Secondary | ICD-10-CM | POA: Diagnosis not present

## 2018-05-27 DIAGNOSIS — R2689 Other abnormalities of gait and mobility: Secondary | ICD-10-CM | POA: Diagnosis not present

## 2018-05-27 NOTE — Patient Instructions (Signed)
   Upper Extremity Extension (All-Fours)   Tighten stomach and raise left arm parallel to floor. Keep trunk rigid. Then raise right arm. Repeat 10 times per set.  Do 1 sessions per day.   Scapular Retraction (Prone)   Lie with arms at sides. Pinch shoulder blades together and raise arms a few inches from floor.  (thumbs down) Repeat 10 times per set. Do 1 sessions per day.  Scapular Retraction: Abduction / Extension (Prone)   Lie with arms out from sides 90. Pinch shoulder blades together and raise arms a few inches from floor. Palms down.  Repeat 10 times per set. Do 1 sessions per day.

## 2018-05-27 NOTE — Therapy (Signed)
[facility] [address] [place], [place], [address] Phone: [phone]   Fax:  [phone]  Occupational Therapy Treatment  Patient Details  Name: Kayla [name] MRN: [id number] Date of Birth: 12/01/1950 No data recorded  Encounter Date: 05/27/2018  OT End of Session - 05/27/18 1238    Visit Number  9    Number of Visits  9    Date for OT Re-Evaluation  05/15/18    Authorization Type  Medicare, BCBS--covered 100%    Authorization Time Period  cert. 04/15/18-07/14/18    Authorization - Visit Number  9    Authorization - Number of Visits  10    OT Start Time  1023    OT Stop Time  1103    OT Time Calculation (min)  40 min    Activity Tolerance  Patient tolerated treatment well    Behavior During Therapy  WFL for tasks assessed/performed       Past Medical History:  Diagnosis Date  . Arthritis    oa  . Cervical cancer ([place]) 2005  . COPD (chronic obstructive pulmonary disease) ([place])   . Multiple sclerosis ([place])   . Osteoporosis   . Other diseases of vocal cords    vocal cords are not even  . Other voice and resonance disorders   . Pneumonia 2014, 1992  . Sleep apnea    no cpap used  . Unspecified hypothyroidism     Past Surgical History:  Procedure Laterality Date  . ABDOMINAL HYSTERECTOMY  2005   for cervical cancer, complete  . CONVERSION TO TOTAL HIP Right 09/18/2015   Procedure: CONVERSION OF RIGHT HEMI ARTHROPLASTY TO TOTAL HIP ARTHROPLASTY ACETABULAR REVISON ;  Surgeon: Paralee Cancel, MD;  [facility];  Service: Orthopedics;  Laterality: Right;  . EYE SURGERY Bilateral    lens for cataracts  . HIP ARTHROPLASTY Right 03/01/2013   Procedure: ARTHROPLASTY BIPOLAR HIP;  Surgeon: Mauri Pole, MD;  [facility];  Service: Orthopedics;  Laterality: Right;  . HIP CLOSED REDUCTION Right 03/12/2013   Procedure: CLOSED REDUCTION HIP;  Surgeon: Gearlean Alf, MD;  Location: [facility];  Service:  Orthopedics;  Laterality: Right;  . HIP CLOSED REDUCTION Right 03/19/2013   Procedure: CLOSED REDUCTION HIP;  Surgeon: Johnn Hai, MD;  Location: [facility];  Service: Orthopedics;  Laterality: Right;  . HIP CLOSED REDUCTION Right 11/10/2015   Procedure: CLOSED MANIPULATION HIP;  Surgeon: Rod Can, MD;  [facility];  Service: Orthopedics;  Laterality: Right;  . HIP CLOSED REDUCTION Right 06/05/2017   Procedure: CLOSED REDUCTION HIP;  Surgeon: [name], MD;  [facility];  Service: Orthopedics;  Laterality: Right;  . TOTAL HIP REVISION Right 06/06/2017   Procedure: TOTAL HIP REVISION;  Surgeon: [name], MD;  [facility];  Service: Orthopedics;  Laterality: Right;  Mar[facility] Kitchen VESICOVAGINAL FISTULA CLOSURE W/ TAH  2005    There were no vitals filed for this visit.  Subjective Assessment - 05/27/18 1057    Subjective   Pt reports that she is pleased with her progress and that she can take her shirt off the "regular" way now    Pertinent History  MS.    PMH:  hx of R shoulder fx 11/2016, hypothyroidism, osteoporosis, hx of R hip dislocation/fx and multiple surgeries, hx of R achilles rupture, positional sleep apnea, hypotension, hx of cervical CA, arthritis, COPD    Patient Stated Goals  improve R shoulder strength/ROM, be able  to return to working out at gym    Currently in Pain?  No/denies          In supine, cane ex for shoulder flex and abduction and chest press.  In supine, shoulder flex, horizontal abduction, and chest press with 2lb wt (RUE only) x15 each.   In quadruped, alternating UE lifts for incr scapular stability/UE strength.  Rolling ball forward/backwards for shoulder flex.  In standing, functional reaching with RUE to place/remove clothespins on vertical pole.  In prone, scapular retraction with shoulders in extension and abduction.    Arm bike x6 min level 3 for reciprocal movement/conditioning without rest (forward/backwards).  Checked goals and  discussed progress.  Pt ready for d/c OT.         OT Education - 05/27/18 1056    Education Details  added to HEP per pt request--see pt instructions    Person(s) Educated  Patient    Methods  Explanation;Demonstration;Handout;Verbal cues    Comprehension  Verbalized understanding;Returned demonstration          OT Long Term Goals - 05/27/18 1037      OT LONG TERM GOAL #1   Title  Pt will be independent with HEP for RUE ROM/strength.--check goals 05/16/18    Status  Achieved      OT LONG TERM GOAL #2   Title  Pt will be able to lift/retrieve 2lb object using at least 115* shoulder flex with RUE safely x3.    Status  Achieved      OT LONG TERM GOAL #3   Title  Pt will verbalize understanding of appropriate community fitness activities and adaptions for activities prn.    Status  Achieved   05/12/18:  discussed ACT (with trainer set-up), Silver Sneakers, Patrick #4   Title  Pt will be able to demo at least 125* R shoulder flex without compensation for ADLs/IADLs.    Baseline  110*    Status  Achieved   05/12/18:  115*.  05/27/18:  130*     OT LONG TERM GOAL #5   Title  Pt will demo at least 120* R shoulder abduction for ADLs/IADLs.    Baseline  110*    Status  Achieved   05/12/18:  115*.  05/27/18:  130*           Plan - 05/27/18 1050    Clinical Impression Statement  Pt has made excellent progress with all goals met.  Pt is pleased with progress.    Occupational Profile and client history currently impacting functional performance  Pt is mod I with ADLs/IADLs.  However, pt reports difficulty with overhead reaching tasks and would like to be able to return to community fitness activities.    Occupational performance deficits (Please refer to evaluation for details):  ADL's;IADL's;Leisure;Social Participation    Rehab Potential  Good    OT Frequency  2x / week    OT Duration  4 weeks    OT Treatment/Interventions  Self-care/ADL  training;Therapeutic exercise;Patient/family education;Neuromuscular education;Moist Heat;Aquatic Therapy;Energy conservation;Therapist, nutritional;Therapeutic activities;Passive range of motion;Manual Therapy;DME and/or AE instruction;Cryotherapy    Plan  d/c OT    Consulted and Agree with Plan of Care  Patient       Patient will benefit from skilled therapeutic intervention in order to improve the following deficits and impairments:     Visit Diagnosis: Muscle weakness (generalized)  Unsteadiness on feet  Other abnormalities of gait  and mobility  Other lack of coordination  Abnormal posture    Problem List Patient Active Problem List   Diagnosis Date Noted  . Ankle pain, right 08/11/2017  . Hypotension 06/07/2017  . Failed total hip arthroplasty with dislocation ([place]) 06/05/2017  . Cough variant asthma vs vcd  related to [facility]  11/10/2016  . Positional sleep apnea 09/10/2016  . Periodic limb movements of sleep 09/10/2016  . OSA on CPAP 02/08/2016  . S/P revision right [facility] 09/18/2015  . Leg swelling 08/21/2014  . Acute URI 04/04/2014  . Health care maintenance 12/16/2013  . Other and unspecified hyperlipidemia 11/04/2013  . 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 S/P hip hemiarthroplasty 03/15/2013  . Dislocation of hip prosthesis ([place]) 03/12/2013  . Hip dislocation, left ([place]) 03/12/2013  . Hip dislocation, right ([place]) 03/12/2013  . Urinary retention with incomplete bladder emptying 03/09/2013  . Dysphagia, pharyngoesophageal phase 03/09/2013  . Femoral neck fracture ([place]) 03/08/2013  . Unspecified constipation 02/26/2013  . Fractured femoral neck ([place]) 02/23/2013  . Hip fracture ([place]) 02/23/2013  . Excessive flatus 10/21/2012  . Cold feet 07/29/2012  . Hyponatremia 05/20/2012  . Calf pain 11/17/2011  . Dyspnea 12/06/2007  . Post-menopausal osteoporosis 09/17/2007  . Hypothyroidism 04/01/2007  . Multiple sclerosis ([place]) 04/01/2007  . Other diseases of vocal cords  04/01/2007    OCCUPATIONAL THERAPY DISCHARGE SUMMARY  Visits from Start of Care: 9  Current functional level related to goals / functional outcomes: See above   Remaining deficits: Mild decr R shoulder ROM/strength--improved    Education / Equipment: Pt instructed in HEP and appropriate community fitness options.  Pt verbalized understanding of all education provided.  Plan: Patient agrees to discharge.  Patient goals were met. Patient is being discharged due to meeting the stated rehab goals.  ?????        [facility] 05/27/2018, 12:39 PM  [facility] [address] [place], [place], [address] Phone: [phone]   Fax:  [phone]  Name: Kayla [name] MRN: [id number] Date of Birth: July 07, 1950   Vianne Bulls, OTR/L [facility] [address] [place], [place]  [address] [phone] phone [phone] 05/27/18 12:41 PM

## 2018-06-16 ENCOUNTER — Other Ambulatory Visit: Payer: Self-pay | Admitting: Internal Medicine

## 2018-07-14 ENCOUNTER — Other Ambulatory Visit: Payer: Self-pay | Admitting: Internal Medicine

## 2018-08-16 ENCOUNTER — Other Ambulatory Visit: Payer: Self-pay | Admitting: Internal Medicine

## 2018-08-25 DIAGNOSIS — D72819 Decreased white blood cell count, unspecified: Secondary | ICD-10-CM | POA: Diagnosis not present

## 2018-08-25 DIAGNOSIS — G35 Multiple sclerosis: Secondary | ICD-10-CM | POA: Diagnosis not present

## 2018-08-26 ENCOUNTER — Ambulatory Visit (INDEPENDENT_AMBULATORY_CARE_PROVIDER_SITE_OTHER): Payer: Medicare Other | Admitting: Internal Medicine

## 2018-08-26 ENCOUNTER — Other Ambulatory Visit: Payer: Self-pay

## 2018-08-26 ENCOUNTER — Encounter: Payer: Self-pay | Admitting: Internal Medicine

## 2018-08-26 VITALS — BP 116/70 | HR 97 | Temp 97.7°F | Ht 63.0 in | Wt 121.6 lb

## 2018-08-26 DIAGNOSIS — R0609 Other forms of dyspnea: Secondary | ICD-10-CM

## 2018-08-26 DIAGNOSIS — J45991 Cough variant asthma: Secondary | ICD-10-CM

## 2018-08-26 DIAGNOSIS — M7989 Other specified soft tissue disorders: Secondary | ICD-10-CM

## 2018-08-26 DIAGNOSIS — E038 Other specified hypothyroidism: Secondary | ICD-10-CM

## 2018-08-26 DIAGNOSIS — R0600 Dyspnea, unspecified: Secondary | ICD-10-CM

## 2018-08-26 LAB — CBC WITH DIFFERENTIAL/PLATELET
Basophils Absolute: 0 10*3/uL (ref 0.0–0.1)
Basophils Relative: 0.6 % (ref 0.0–3.0)
Eosinophils Absolute: 0 10*3/uL (ref 0.0–0.7)
Eosinophils Relative: 0.5 % (ref 0.0–5.0)
HCT: 35.7 % — ABNORMAL LOW (ref 36.0–46.0)
Hemoglobin: 12 g/dL (ref 12.0–15.0)
Lymphocytes Relative: 24.7 % (ref 12.0–46.0)
Lymphs Abs: 1.8 10*3/uL (ref 0.7–4.0)
MCHC: 33.7 g/dL (ref 30.0–36.0)
MCV: 88.9 fl (ref 78.0–100.0)
Monocytes Absolute: 1 10*3/uL (ref 0.1–1.0)
Monocytes Relative: 14.1 % — ABNORMAL HIGH (ref 3.0–12.0)
Neutro Abs: 4.3 10*3/uL (ref 1.4–7.7)
Neutrophils Relative %: 60.1 % (ref 43.0–77.0)
Platelets: 376 10*3/uL (ref 150.0–400.0)
RBC: 4.02 Mil/uL (ref 3.87–5.11)
RDW: 13.9 % (ref 11.5–15.5)
WBC: 7.2 10*3/uL (ref 4.0–10.5)

## 2018-08-26 LAB — BASIC METABOLIC PANEL
BUN: 19 mg/dL (ref 6–23)
CO2: 27 mEq/L (ref 19–32)
Calcium: 9.7 mg/dL (ref 8.4–10.5)
Chloride: 98 mEq/L (ref 96–112)
Creatinine, Ser: 0.75 mg/dL (ref 0.40–1.20)
GFR: 77.29 mL/min (ref >60.00)
Glucose, Bld: 94 mg/dL (ref 70–99)
Potassium: 3.6 mEq/L (ref 3.5–5.1)
Sodium: 136 mEq/L (ref 135–145)

## 2018-08-26 LAB — TSH: TSH: 1.73 u[IU]/mL (ref 0.35–4.50)

## 2018-08-26 NOTE — Progress Notes (Signed)
Subjective:    Patient ID: Kayla [name], female    DOB: Aug 30, 1950   MRN: [id number]   Brief patient profile:  5 yowf never smoker with MS since Oct 96 all neuro care through Dr [name] in [facility] (formerly of [facility]),   [name] at [facility] urology,  [place] [facility]  Followed here for Primary care/hypothyroid     History of Present Illness  October 04, 2009 Followup with PFT's. Pt states that her breathing is no better since last seen June 2011 for acute visit. She states she is still wheezing- ventolin helps and is using this up to tid. w/u c/w anemia  Mild dysphagia no more than usual. occ waking up wheezing at night despite zegerid at hs.   rec  Try  use dulera 100 2 bid prn   September 18 2015  R THR [name]    10/01/2015  f/u ov/Chari Parmenter re: sob s/p et on dulera 100 2bid and dexilant q am  Chief Complaint  Patient presents with  . Acute Visit    Pt c/o increased DOE since had hip replacement 09/18/15. She states she gets SOB with just talking, showering or walking short distances.   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 Follow-up    Doing well and denies any co's today.   back in CPAP as of July 2017  > no change in symptoms but wakes her up a lot / f/u per Dr [name] planned  rec See Dr [name] for all issues related to your cpap machine but it appears to be working fine by your download today  No change in medications and recs     08/08/2016  f/u ov/Corleen Otwell re: multiple concerns 1) check thyroid - clinically no symptoms 2) feet stay cold/ blue with neg vascular w/u to date 3) wants referral to sports medicine as needs stay hyperextended due to  MS/ no problem with falling just using cane Rec Please see patient coordinator before you leave today  to schedule referal to sports medicine and vascular surgery Please remember to go to the lab department downstairs in the basement  for your tests - we will call you with the results when they are available. Please schedule a follow up visit in 3 months but call sooner if needed     11/10/2016  f/u ov/Naquita Nappier re: hyponatremia  Chief Complaint  Patient presents with  . Follow-up    recent visit to ED- 11/07/16 for hyponatremia- sodium level was 123.   Bladder dysfunction / foley dep since 11/04/16 > f/u McDermodt Knee hyperext better with support  Hyponatremia/ no symptoms on  Lasix daily  cpap d/c 'd 09/03/2016 with RLS not bothersome to her > neurology f/u  Asthma vs vcd    dulera 100 2bid / hfa with some sense of globus but no cough or increased sob/ noct symptoms or need for saba  rec Plan A = Automatic = dulera 100 2 pffs each am and then only take  the pm the dose if you are not doing great and/or needing your rescue inhaler  Work on inhaler technique:      Plan B = Backup Only use your albuterol (ventolin) as a rescue medication   Drink gatorade if you need to push more fluids for any reason           03/29/2018 acute extended ov/Izabell Schalk re: uri/ cough  Chief Complaint  Patient presents with  . Acute Visit    runny nose and prod cough - yellowish.  Symptoms began 3 days ago   acute symptoms x 3 d,  started with sensation of "head cold" then grad down to chest / cough worse at hs  Has starting needing saba bid helps a little, at baseline not needing at all  No cp/ fever rec Augmentin 875 mg take one pill twice daily  X 10 days -  Prednisone 10 mg take  4 each am x 2 days,   2 each am x 2 days,  1 each am x 2 days and stop     08/26/2018  f/u ov/Yousra Ivens re: asthma/ vcd and hypothyroid Chief Complaint  Patient presents with  . Follow-up    Doing well and no co'.s    Dyspnea:   MMRC3 = can't walk 100 yards even at a slow pace at a flat grade s stopping due to sob with 2 wheel walker  Cough: no Sleeping: on side bed flat/ 1 pillow SABA use: none 02: none    No obvious day to day or daytime variability or assoc excess/ purulent sputum or mucus plugs or hemoptysis or cp or chest tightness, subjective wheeze or overt sinus or hb symptoms.   Sleeping as above without nocturnal  or early am exacerbation  of respiratory  c/o's or need for noct saba. Also denies any obvious fluctuation of symptoms with weather or environmental changes or other aggravating or alleviating factors except as outlined above   No unusual exposure hx or h/o childhood pna/ asthma or knowledge of premature birth.  Current Allergies, Complete Past Medical History, Past Surgical History, Family History, and Social History were reviewed in [employer] record.  ROS  The following are not active complaints unless bolded Hoarseness, sore throat, dysphagia, dental problems, itching, sneezing,  nasal congestion or discharge of excess mucus or purulent secretions, ear ache,   fever, chills, sweats, unintended wt loss or wt gain, classically pleuritic or exertional cp,  orthopnea pnd or arm/hand swelling  or leg swelling, presyncope, palpitations, abdominal pain, anorexia, nausea, vomiting, diarrhea  or change in bowel habits or change in bladder habits, change in stools or change in urine, dysuria, hematuria,  rash, arthralgias, visual complaints, headache, numbness, weakness or ataxia or problems with walking or coordination,  change in mood or  memory.        Current Meds  Medication Sig  . amphetamine-dextroamphetamine (ADDERALL) 20 MG tablet Take 20 mg by mouth 2 (two) times daily.   . baclofen (LIORESAL) 20 MG tablet Take 20 mg by mouth 3 (three) times daily.   . Calcium Carbonate-Vitamin D (CALCIUM 600+D) 600-400 MG-UNIT tablet Take 1 tablet by mouth daily.  . cholecalciferol  (VITAMIN D) 1000 UNITS tablet Take 2,000 Units by mouth daily.   Marland Kitchen DEXILANT 60 MG capsule TAKE 1 CAPSULE ONCE DAILY WITH BREAKFAST.  Marland Kitchen FLUoxetine (PROZAC) 40 MG capsule Take 40 mg by mouth daily.    . furosemide (LASIX) 20 MG tablet TAKE 1 TABLET ONCE  DAILY.  . gabapentin (NEURONTIN) 600 MG tablet Take 600 mg by mouth 2 (two) times daily.  Marland Kitchen levothyroxine (SYNTHROID) 100 MCG tablet TAKE 1 TABLET ONCE DAILY BEFORE BREAKFAST.  . mometasone-formoterol (DULERA) 100-5 MCG/ACT AERO Inhale 2 puffs into the lungs every 12 (twelve) hours.  . Multiple Vitamin (MULTIVITAMIN WITH MINERALS) TABS tablet Take 1 tablet by mouth daily.  Marland Kitchen MYRBETRIQ 25 MG TB24 tablet Take 1 tablet by mouth daily.  Marland Kitchen ocrelizumab (OCREVUS) 300 MG/10ML injection Inject 300 mg into the vein every 6 (six) months.   . Respiratory Therapy Supplies (FLUTTER) DEVI Use as directed  . temazepam (RESTORIL) 15 MG capsule Take 15 mg by mouth at bedtime.   . VENTOLIN HFA 108 (90 Base) MCG/ACT inhaler INHALE 2 PUFFS INTO LUNGS EVERY 6 HOURS AS NEEDED.                Past Medical History:  MULTIPLE SCLEROSIS (ICD-340)........................Marland Kitchen Dr Effie Shy [facility], was at [facility])  - dx 12/1994  Sleep Apnea.....................................................Marland Kitchen        Dr Melida Quitter       - CPAP rx 04/2010 HYPOTHYROIDISM (ICD-244.9)  OTHER DISEASES OF VOCAL CORDS (ICD-478.5)  Cervical Cancer > complete hysterectomy...........Marland Kitchen [name] HOARSENESS (ICD-784.49).................................  Bates  Bladder dysfunction ...............................................Marland Kitchen  McDermodt  Dyspnea/wheeze ? asthma  - HFA 50 % after coaching October 04, 2009 >  50% 06/25/2010  - PFT's October 04, 2009 FEV1 2.15 (95%) with ratio 69 and DLCO 58> corrects to 98, mild insp truncation  Health Maintenance.............................................Marland KitchenWert  - Pneumovax March 28, 2009 and Prevnar 13 11/06/2015  - Td  April 10, 2009  - CPX  11/06/2015        Objective:   Physical Exam       Vital signs reviewed - Note on arrival 02 sats  95% on RA  /no fever   Wt  118 > 127 March 28, 2009 >129 June 17 > 128 October 04, 2009 > 129 06/25/2010 >125 10/29/2010 > 128 11/26/2010 >>127 12/10/2010 > 11/17/2011  129> 126 05/20/2012 > 07/29/2012  129 > 10/20/2012  129 >  135 06/21/2013 >  11/04/2013 135 >134 04/04/2014 > 04/18/2014 135 >  07/17/2014 132> 08/17/2014  135 > 12/20/2014 > 01/17/2015 134   > 08/08/2015   126 >  10/01/2015   130 > 11/06/2015   130 > 11/10/2016  123 > 02/10/2017  133  > 05/14/2017   129 > 08/11/2017  125 > 11/24/2017  127 >  02/23/2018  127 > 03/29/2018   128 > 08/26/2018  122   amb hoarse wf nad though clearly "wheezing" on insp     HEENT: nl dentition,   and oropharynx. Nl external ear canals without cough reflex - mild/mod bilateral non-specific turbinate edema         HEENT: nl dentition, turbinates bilaterally, and oropharynx. Nl external ear canals without cough reflex   NECK :  without JVD/Nodes/TM/ nl carotid upstrokes bilaterally   LUNGS: no acc muscle use,  Nl contour chest  With transmitted upper airway sounds  without cough on insp or exp maneuvers   CV:  RRR  no s3 or murmur or increase in P2, and no edema   ABD:  soft and nontender with nl inspiratory excursion in the supine position. No bruits or organomegaly appreciated, bowel sounds nl  MS:  Walks with 2 wheeled walker, ext warm without deformities, calf tenderness, cyanosis or clubbing No obvious joint restrictions   SKIN: warm and dry with  chronic purplish discoloration both feet.     NEURO:  alert, approp, nl sensorium with  no motor or cerebellar deficits apparent.     Labs ordered/ reviewed:      Chemistry      Component Value Date/Time   NA 136 08/26/2018 0955   K 3.6 08/26/2018 0955   CL 98 08/26/2018 0955   CO2 27 08/26/2018 0955   BUN 19 08/26/2018 0955   CREATININE 0.75 08/26/2018 0955      Component Value Date/Time   CALCIUM 9.7  08/26/2018 0955                             Lab Results  Component Value Date   WBC 7.2 08/26/2018   HGB 12.0 08/26/2018   HCT 35.7 (L) 08/26/2018   MCV 88.9 08/26/2018   PLT 376.0 08/26/2018       EOS                                                              0.0                                     08/26/2018       Assessment & Plan:

## 2018-08-26 NOTE — Patient Instructions (Signed)
Please remember to go to the lab department   for your tests - we will call you with the results when they are available.       Please schedule a follow up visit in 6 months but call sooner if needed  

## 2018-08-27 NOTE — Progress Notes (Signed)
Left detailed msg on machine with results ok per DPR

## 2018-08-29 ENCOUNTER — Encounter: Payer: Self-pay | Admitting: Internal Medicine

## 2018-08-29 NOTE — Assessment & Plan Note (Signed)
-  11/10/2016 rec try just taking 2 pffs each am since having sense of globus on 2bid with baseline poor technique - flutter valve rec 05/14/2017    - 08/26/2018  After extensive coaching inhaler device,  effectiveness =    75% (short Ti)   All goals of chronic asthma control met including optimal function and elimination of symptoms with minimal need for rescue therapy.  Contingencies discussed in full including contacting this office immediately if not controlling the symptoms using the rule of two's.      I had an extended discussion with the patient reviewing all relevant studies completed to date and  lasting 15 to 20 minutes of a 25 minute visit    See device teaching which extended face to face time for this visit.  Each maintenance medication was reviewed in detail including emphasizing most importantly the difference between maintenance and prns and under what circumstances the prns are to be triggered using an action plan format that is not reflected in the computer generated alphabetically organized AVS which I have not found useful in most complex patients, especially with respiratory illnesses  Please see AVS for specific instructions unique to this visit that I personally wrote and verbalized to the the pt in detail and then reviewed with pt  by my nurse highlighting any  changes in therapy recommended at today's visit to their plan of care.

## 2018-08-29 NOTE — Assessment & Plan Note (Signed)
Dyspnea/wheeze ? asthma  - HFA 50 % after coaching October 04, 2009 >  Better on dulera 100 > hfa  50% 06/25/2010  - PFT's October 04, 2009 FEV1 2.15 (95%) with ratio 69 and DLCO 58> corrects to 98, mild insp truncation  - 07/17/2014 p extensive coaching HFA effectiveness =    75%  - flared p ET 09/18/15    Continues with UAO secondary to vcd related to MS, well compensated.

## 2018-08-29 NOTE — Assessment & Plan Note (Signed)
Adequate control on present rx, reviewed in detail with pt > no change in rx needed  > synthroid 100 mcg daily

## 2018-08-29 NOTE — Assessment & Plan Note (Signed)
-   try off neurontin 08/17/14 > did not do > rec that she discuss this with her neurologist 12/20/2014  - art/venous dopplers rec 08/10/2015 > nl arterial flows  Controlled on lasix 20 mg daily with nl bmet > no change rx

## 2018-08-31 DIAGNOSIS — H35373 Puckering of macula, bilateral: Secondary | ICD-10-CM | POA: Diagnosis not present

## 2018-08-31 DIAGNOSIS — G35 Multiple sclerosis: Secondary | ICD-10-CM | POA: Diagnosis not present

## 2018-08-31 DIAGNOSIS — H524 Presbyopia: Secondary | ICD-10-CM | POA: Diagnosis not present

## 2018-10-01 ENCOUNTER — Other Ambulatory Visit: Payer: Self-pay | Admitting: Internal Medicine

## 2018-10-25 DIAGNOSIS — E039 Hypothyroidism, unspecified: Secondary | ICD-10-CM | POA: Diagnosis not present

## 2018-10-25 DIAGNOSIS — G35 Multiple sclerosis: Secondary | ICD-10-CM | POA: Diagnosis not present

## 2018-10-25 DIAGNOSIS — D72819 Decreased white blood cell count, unspecified: Secondary | ICD-10-CM | POA: Diagnosis not present

## 2018-10-25 DIAGNOSIS — N319 Neuromuscular dysfunction of bladder, unspecified: Secondary | ICD-10-CM | POA: Diagnosis not present

## 2018-10-25 DIAGNOSIS — G4700 Insomnia, unspecified: Secondary | ICD-10-CM | POA: Diagnosis not present

## 2018-10-25 DIAGNOSIS — F329 Major depressive disorder, single episode, unspecified: Secondary | ICD-10-CM | POA: Diagnosis not present

## 2018-10-25 DIAGNOSIS — K219 Gastro-esophageal reflux disease without esophagitis: Secondary | ICD-10-CM | POA: Diagnosis not present

## 2018-10-25 DIAGNOSIS — F988 Other specified behavioral and emotional disorders with onset usually occurring in childhood and adolescence: Secondary | ICD-10-CM | POA: Diagnosis not present

## 2018-11-04 ENCOUNTER — Other Ambulatory Visit (INDEPENDENT_AMBULATORY_CARE_PROVIDER_SITE_OTHER): Payer: Medicare Other

## 2018-11-04 ENCOUNTER — Telehealth: Payer: Self-pay | Admitting: Internal Medicine

## 2018-11-04 DIAGNOSIS — N3090 Cystitis, unspecified without hematuria: Secondary | ICD-10-CM

## 2018-11-04 NOTE — Telephone Encounter (Signed)
Dr. Melvyn Novas, please advise if you are okay with pt coming in to provide a urine sample to see if she could possibly have a UTI? Thanks!

## 2018-11-04 NOTE — Telephone Encounter (Signed)
That's fine or if this is the first time ok to just do cipro 500mg  daily x 5 days   Dx cystitis

## 2018-11-04 NOTE — Telephone Encounter (Signed)
Order placed. Per Irma, phlebotomist pt had already dropped off a urine sample. Nothing further needed.

## 2018-11-05 ENCOUNTER — Telehealth: Payer: Self-pay | Admitting: Internal Medicine

## 2018-11-05 LAB — URINALYSIS
Bilirubin Urine: NEGATIVE
Hgb urine dipstick: NEGATIVE
Ketones, ur: NEGATIVE
Leukocytes,Ua: NEGATIVE
Nitrite: NEGATIVE
Specific Gravity, Urine: 1.015 (ref 1.000–1.030)
Total Protein, Urine: NEGATIVE
Urine Glucose: NEGATIVE
Urobilinogen, UA: 0.2 (ref 0.0–1.0)
pH: 7 (ref 5.0–8.0)

## 2018-11-05 MED ORDER — CIPROFLOXACIN HCL 500 MG PO TABS: 500.0000 mg | ORAL_TABLET | Freq: Every day | ORAL | 0 refills | Status: DC

## 2018-11-05 NOTE — Telephone Encounter (Signed)
Called and spoke with pt letting her know that the results of the UA were not back yet but stated to her that MW said we could go ahead and send in cipro abx to see if this would help and pt said that that would be great. Verified pt's preferred pharmacy and sent abx to pharmacy for her. Nothing further needed.

## 2018-11-17 ENCOUNTER — Telehealth: Payer: Self-pay | Admitting: Internal Medicine

## 2018-11-17 NOTE — Telephone Encounter (Signed)
Returned call to patient who says she filled her thyroid medication the other day but has misplaced the bottle. She thinks it may have been thrown in trash by accident. Informed MW filled for 1 year. She can call pharmacy and have filled again but will have to pay out of pocket. Pt will contact pharmacy. Nothing further needed.

## 2018-12-28 ENCOUNTER — Telehealth: Payer: Self-pay | Admitting: Internal Medicine

## 2018-12-28 NOTE — Telephone Encounter (Signed)
Called and spoke with Patient. Patient requesting high dose flu vaccine tomorrow. Patient scheduled 12/29/18 at 3pm.

## 2018-12-29 ENCOUNTER — Ambulatory Visit (INDEPENDENT_AMBULATORY_CARE_PROVIDER_SITE_OTHER): Payer: Medicare Other

## 2018-12-29 ENCOUNTER — Other Ambulatory Visit: Payer: Self-pay

## 2018-12-29 DIAGNOSIS — Z23 Encounter for immunization: Secondary | ICD-10-CM

## 2018-12-31 ENCOUNTER — Encounter: Payer: Self-pay | Admitting: Internal Medicine

## 2018-12-31 DIAGNOSIS — Z1231 Encounter for screening mammogram for malignant neoplasm of breast: Secondary | ICD-10-CM | POA: Diagnosis not present

## 2019-02-23 DIAGNOSIS — G35 Multiple sclerosis: Secondary | ICD-10-CM | POA: Diagnosis not present

## 2019-02-23 DIAGNOSIS — D511 Vitamin B12 deficiency anemia due to selective vitamin B12 malabsorption with proteinuria: Secondary | ICD-10-CM | POA: Diagnosis not present

## 2019-02-23 DIAGNOSIS — D72819 Decreased white blood cell count, unspecified: Secondary | ICD-10-CM | POA: Diagnosis not present

## 2019-02-23 DIAGNOSIS — E038 Other specified hypothyroidism: Secondary | ICD-10-CM | POA: Diagnosis not present

## 2019-02-23 DIAGNOSIS — G3184 Mild cognitive impairment, so stated: Secondary | ICD-10-CM | POA: Diagnosis not present

## 2019-02-25 ENCOUNTER — Other Ambulatory Visit: Payer: Self-pay

## 2019-02-25 ENCOUNTER — Ambulatory Visit (INDEPENDENT_AMBULATORY_CARE_PROVIDER_SITE_OTHER): Payer: Medicare Other | Admitting: Internal Medicine

## 2019-02-25 DIAGNOSIS — E038 Other specified hypothyroidism: Secondary | ICD-10-CM | POA: Diagnosis not present

## 2019-02-25 DIAGNOSIS — J45991 Cough variant asthma: Secondary | ICD-10-CM

## 2019-02-25 NOTE — Progress Notes (Signed)
Subjective:    Patient ID: Kayla [name], female    DOB: Dec 17, 1950   MRN: [id number]   Brief patient profile:  6 yowf never smoker with MS since Oct 96 all neuro care through Dr [name] in [facility] (formerly of [facility]),   [name] at [facility] urology,  [place] [facility]  Followed here for Primary care/hypothyroid     History of Present Illness  October 04, 2009 Followup with PFT's. Pt states that her breathing is no better since last seen June 2011 for acute visit. She states she is still wheezing- ventolin helps and is using this up to tid. w/u c/w anemia  Mild dysphagia no more than usual. occ waking up wheezing at night despite zegerid at hs.   rec  Try  use dulera 100 2 bid prn   September 18 2015  R THR [name]    10/01/2015  f/u ov/Kayla [name] re: sob s/p et on dulera 100 2bid and dexilant q am  Chief Complaint  Patient presents with  . Acute Visit    Pt c/o increased DOE since had hip replacement 09/18/15. She states she gets SOB with just talking, showering or walking short distances.   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 Follow-up    Doing well and denies any co's today.   back in CPAP as of July 2017  > no change in symptoms but wakes her up a lot / f/u per Dr [name] planned  rec See Dr [name] for all issues related to your cpap machine but it appears to be working fine by your download today  No change in medications and recs     08/08/2016  f/u ov/Kayla [name] re: multiple concerns 1) check thyroid - clinically no symptoms 2) feet stay cold/ blue with neg vascular w/u to date 3) wants referral to sports medicine as needs stay hyperextended due to  MS/ no problem with falling just using cane Rec Please see patient coordinator before you leave today  to schedule referal to sports medicine and vascular surgery Please remember to go to the lab department downstairs in the basement  for your tests - we will call you with the results when they are available. Please schedule a follow up visit in 3 months but call sooner if needed     11/10/2016  f/u ov/Kayla [name] re: hyponatremia  Chief Complaint  Patient presents with  . Follow-up    recent visit to ED- 11/07/16 for hyponatremia- sodium level was 123.   Bladder dysfunction / foley dep since 11/04/16 > f/u McDermodt Knee hyperext better with support  Hyponatremia/ no symptoms on  Lasix daily  cpap d/c 'd 09/03/2016 with RLS not bothersome to her > neurology f/u  Asthma vs vcd    dulera 100 2bid / hfa with some sense of globus but no cough or increased sob/ noct symptoms or need for saba  rec Plan A = Automatic = dulera 100 2 pffs each am and then only take  the pm the dose if you are not doing great and/or needing your rescue inhaler  Work on inhaler technique:      Plan B = Backup Only use your albuterol (ventolin) as a rescue medication   Drink gatorade if you need to push more fluids for any reason           03/29/2018 acute extended ov/Kayla [name] re: uri/ cough  Chief Complaint  Patient presents with  . Acute Visit    runny nose and prod cough - yellowish.  Symptoms began 3 days ago   acute symptoms x 3 d,  started with sensation of "head cold" then grad down to chest / cough worse at hs  Has starting needing saba bid helps a little, at baseline not needing at all  No cp/ fever rec Augmentin 875 mg take one pill twice daily  X 10 days -  Prednisone 10 mg take  4 each am x 2 days,   2 each am x 2 days,  1 each am x 2 days and stop     08/26/2018  f/u ov/Kayla [name] re: asthma/ vcd and hypothyroid Chief Complaint  Patient presents with  . Follow-up    Doing well and no co'.s    Dyspnea:   MMRC3 = can't walk 100 yards even at a slow pace at a flat grade s stopping due to sob with 2 wheel walker  Cough: no Sleeping: on side bed flat/ 1 pillow SABA use: none 02: none  rec No change rx   Virtual Visit via Telephone Note 02/25/2019   I connected with Kayla [name] on 02/25/19 at  8:30 AM EST by telephone and verified that I am speaking with the correct person using two identifiers.   I discussed the limitations, risks, security and privacy concerns of performing an evaluation and management service by telephone and the availability of in person appointments. I also discussed with the patient that there may be a patient responsible charge related to this service. The patient expressed understanding and agreed to proceed.   History of Present Illness:  Dyspnea:  Rollator/ MMRC3 = can't walk 100 yards even at a slow pace at a flat grade s stopping due to sob  /  Still doing palati's / feels legs and arms stronger p last rx for MS  Cough: no  Sleeping: on side one pillow SABA use: none  02: none    No obvious day to day or daytime variability or assoc excess/ purulent sputum or mucus plugs or hemoptysis or cp or chest tightness, subjective wheeze or overt sinus or hb symptoms.    Also denies any obvious fluctuation of symptoms with weather or environmental changes or other aggravating or alleviating factors except as outlined above.   Meds reviewed/ med reconciliation completed          Observations/Objective: Hoarse with mild insp stridor= baseline,    Assessment and Plan: See problem list for active a/p's   Follow Up Instructions: See avs for instructions unique to this ov which includes revised/ updated med list     I discussed the assessment and treatment plan with the patient. The patient was provided an opportunity to ask questions and all were answered. The patient agreed with the plan and demonstrated an understanding of the instructions.   The  patient was advised to call back or seek an in-person evaluation if the symptoms worsen or if the condition fails to improve as anticipated.  I provided 15  minutes of non-face-to-face time during this encounter.   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  Bates  Bladder dysfunction ...............................................Marland Kitchen  McDermodt  Dyspnea/wheeze ? asthma  - HFA 50 % after coaching October 04, 2009 >  50% 06/25/2010  - PFT's October 04, 2009 FEV1 2.15 (95%) with ratio 69 and DLCO 58> corrects to 98, mild insp truncation  Health Maintenance.............................................Marland KitchenWert  - Pneumovax March 28, 2009 and Prevnar 13 11/06/2015  - Td  April 10, 2009  - CPX   11/06/2015

## 2019-02-25 NOTE — Patient Instructions (Addendum)
Shingrix should be available thru your pharmacy without a prescription  Please schedule a follow up visit in 6 months but call sooner if needed

## 2019-02-27 ENCOUNTER — Encounter: Payer: Self-pay | Admitting: Internal Medicine

## 2019-02-27 NOTE — Assessment & Plan Note (Signed)
Lab Results  Component Value Date   TSH 1.73 08/26/2018     Adequate control on present rx, reviewed in detail with pt > no change in rx needed

## 2019-02-27 NOTE — Assessment & Plan Note (Addendum)
-  11/10/2016 rec try just taking 2 pffs each am since having sense of globus on 2bid with baseline poor technique - flutter valve rec 05/14/2017    - 08/26/2018  After extensive coaching inhaler device,  effectiveness =    75% (short Ti)   All goals of chronic asthma control met including optimal function and elimination of symptoms with minimal need for rescue therapy.  Contingencies discussed in full including contacting this office immediately if not controlling the symptoms using the rule of two's.      Pt informed of the seriousness of COVID 19 infection as a direct risk to their health  and safey and to those of their loved ones and should continue to wear facemask in public and minimize exposure to public locations but especially avoid any area or activity where non-close contacts are not observing distancing or wearing an appropriate face mask.   >>>> f/u in 6 m  Each maintenance medication was reviewed in detail including most importantly the difference between maintenance and as needed and under what circumstances the prns are to be used.  Please see AVS for specific  Instructions which are unique to this visit and I personally typed out  which were reviewed in detail over the phone with the patient and a copy provided via MyChart

## 2019-03-01 ENCOUNTER — Other Ambulatory Visit: Payer: Self-pay | Admitting: Internal Medicine

## 2019-03-10 DIAGNOSIS — R2689 Other abnormalities of gait and mobility: Secondary | ICD-10-CM | POA: Insufficient documentation

## 2019-03-29 DIAGNOSIS — R2689 Other abnormalities of gait and mobility: Secondary | ICD-10-CM | POA: Diagnosis not present

## 2019-03-29 DIAGNOSIS — G35 Multiple sclerosis: Secondary | ICD-10-CM | POA: Diagnosis not present

## 2019-04-05 DIAGNOSIS — R2689 Other abnormalities of gait and mobility: Secondary | ICD-10-CM | POA: Diagnosis not present

## 2019-04-05 DIAGNOSIS — G35 Multiple sclerosis: Secondary | ICD-10-CM | POA: Diagnosis not present

## 2019-04-07 DIAGNOSIS — R2689 Other abnormalities of gait and mobility: Secondary | ICD-10-CM | POA: Diagnosis not present

## 2019-04-07 DIAGNOSIS — G35 Multiple sclerosis: Secondary | ICD-10-CM | POA: Diagnosis not present

## 2019-04-10 ENCOUNTER — Emergency Department (HOSPITAL_BASED_OUTPATIENT_CLINIC_OR_DEPARTMENT_OTHER)
Admission: EM | Admit: 2019-04-10 | Discharge: 2019-04-10 | Disposition: A | Discharge status: Home / Self Care | Payer: No Typology Code available for payment source | Attending: Emergency Medicine | Admitting: Emergency Medicine

## 2019-04-10 ENCOUNTER — Emergency Department (HOSPITAL_BASED_OUTPATIENT_CLINIC_OR_DEPARTMENT_OTHER): Payer: No Typology Code available for payment source

## 2019-04-10 ENCOUNTER — Other Ambulatory Visit: Payer: Self-pay

## 2019-04-10 ENCOUNTER — Encounter (HOSPITAL_BASED_OUTPATIENT_CLINIC_OR_DEPARTMENT_OTHER): Payer: Self-pay | Admitting: Emergency Medicine

## 2019-04-10 DIAGNOSIS — R079 Chest pain, unspecified: Secondary | ICD-10-CM | POA: Diagnosis not present

## 2019-04-10 DIAGNOSIS — S20211A Contusion of right front wall of thorax, initial encounter: Secondary | ICD-10-CM

## 2019-04-10 DIAGNOSIS — I10 Essential (primary) hypertension: Secondary | ICD-10-CM | POA: Diagnosis not present

## 2019-04-10 DIAGNOSIS — S80811A Abrasion, right lower leg, initial encounter: Secondary | ICD-10-CM | POA: Insufficient documentation

## 2019-04-10 DIAGNOSIS — Y999 Unspecified external cause status: Secondary | ICD-10-CM | POA: Diagnosis not present

## 2019-04-10 DIAGNOSIS — Z79899 Other long term (current) drug therapy: Secondary | ICD-10-CM | POA: Diagnosis not present

## 2019-04-10 DIAGNOSIS — W2211XA Striking against or struck by driver side automobile airbag, initial encounter: Secondary | ICD-10-CM | POA: Insufficient documentation

## 2019-04-10 DIAGNOSIS — Y92410 Unspecified street and highway as the place of occurrence of the external cause: Secondary | ICD-10-CM | POA: Insufficient documentation

## 2019-04-10 DIAGNOSIS — Y9389 Activity, other specified: Secondary | ICD-10-CM | POA: Insufficient documentation

## 2019-04-10 DIAGNOSIS — R0789 Other chest pain: Secondary | ICD-10-CM | POA: Diagnosis not present

## 2019-04-10 DIAGNOSIS — S299XXA Unspecified injury of thorax, initial encounter: Secondary | ICD-10-CM | POA: Diagnosis not present

## 2019-04-10 DIAGNOSIS — J449 Chronic obstructive pulmonary disease, unspecified: Secondary | ICD-10-CM | POA: Diagnosis not present

## 2019-04-10 MED ORDER — IBUPROFEN 600 MG PO TABS: 600.0000 mg | ORAL_TABLET | Freq: Three times a day (TID) | ORAL | 0 refills | Status: DC | PRN

## 2019-04-10 MED ORDER — IBUPROFEN 400 MG PO TABS
600.0000 mg | ORAL_TABLET | Freq: Once | ORAL | Status: AC
  Administered 2019-04-10: 600 mg via ORAL
  Filled 2019-04-10: qty 1

## 2019-04-10 MED ORDER — IBUPROFEN 800 MG PO TABS
ORAL_TABLET | ORAL | Status: AC
  Filled 2019-04-10: qty 1

## 2019-04-10 NOTE — ED Notes (Signed)
ED Provider at bedside. 
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
## 2019-04-10 NOTE — ED Notes (Signed)
Patient transported to CT 
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
## 2019-04-15 ENCOUNTER — Encounter (INDEPENDENT_AMBULATORY_CARE_PROVIDER_SITE_OTHER): Payer: Self-pay

## 2019-04-15 ENCOUNTER — Telehealth: Payer: Self-pay | Admitting: Internal Medicine
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
## 2019-04-22 ENCOUNTER — Telehealth: Payer: Self-pay | Admitting: Internal Medicine

## 2019-04-22 ENCOUNTER — Encounter: Payer: Self-pay | Admitting: Internal Medicine

## 2019-04-22 ENCOUNTER — Other Ambulatory Visit: Payer: Self-pay

## 2019-04-22 ENCOUNTER — Encounter (HOSPITAL_COMMUNITY): Payer: Self-pay

## 2019-04-22 ENCOUNTER — Ambulatory Visit (HOSPITAL_COMMUNITY)
Admission: EM | Admit: 2019-04-22 | Discharge: 2019-04-22 | Disposition: A | Discharge status: Home / Self Care | Payer: Medicare Other | Attending: Emergency Medicine | Admitting: Emergency Medicine

## 2019-04-22 ENCOUNTER — Ambulatory Visit (INDEPENDENT_AMBULATORY_CARE_PROVIDER_SITE_OTHER): Payer: Medicare Other

## 2019-04-22 DIAGNOSIS — R0789 Other chest pain: Secondary | ICD-10-CM

## 2019-04-22 DIAGNOSIS — S299XXA Unspecified injury of thorax, initial encounter: Secondary | ICD-10-CM | POA: Diagnosis not present

## 2019-04-22 DIAGNOSIS — R079 Chest pain, unspecified: Secondary | ICD-10-CM

## 2019-04-22 DIAGNOSIS — M546 Pain in thoracic spine: Secondary | ICD-10-CM | POA: Diagnosis not present

## 2019-04-22 MED ORDER — NAPROXEN 375 MG PO TABS: 375.0000 mg | ORAL_TABLET | Freq: Two times a day (BID) | ORAL | 0 refills | Status: DC
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
## 2019-04-22 NOTE — Telephone Encounter (Signed)
Called the patient and advised her of the response received from Dr. Melvyn Novas. Patient voiced understanding. Nothing further needed at this time.
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
## 2019-04-22 NOTE — Telephone Encounter (Signed)
error 

## 2019-04-22 NOTE — Telephone Encounter (Signed)
I'm sorry to hear this but concerned she really should be getting better by now and will need re-eval before rx for any controlled meds - prefer she use urgent care [facility] for this as we don't normally evaluate pts after trauma and have limited xray capabilities for this purpose

## 2019-04-22 NOTE — Discharge Instructions (Signed)
Your chest xray and ekg are unchanged, which is reassuring.  Your vital signs are normal today as well which is also reassuring.  I would like to try naproxen twice a day to see if your pain improves.  If worsening of pain or no improvementin the next 24-48 hours with this medication, shortness of breath , difficulty breathing, worse calf pain,  I would like you to go to the Er for further evaluation.  Continue to follow up with your PCP as scheduled.

## 2019-04-26 ENCOUNTER — Other Ambulatory Visit: Payer: Self-pay

## 2019-04-26 ENCOUNTER — Ambulatory Visit (INDEPENDENT_AMBULATORY_CARE_PROVIDER_SITE_OTHER): Payer: Medicare Other

## 2019-04-26 ENCOUNTER — Ambulatory Visit (INDEPENDENT_AMBULATORY_CARE_PROVIDER_SITE_OTHER): Payer: Medicare Other | Admitting: Internal Medicine

## 2019-04-26 ENCOUNTER — Encounter: Payer: Self-pay | Admitting: Internal Medicine

## 2019-04-26 VITALS — BP 128/78 | HR 87 | Temp 97.0°F | Ht 63.0 in | Wt 126.6 lb

## 2019-04-26 DIAGNOSIS — J45991 Cough variant asthma: Secondary | ICD-10-CM

## 2019-04-26 DIAGNOSIS — L089 Local infection of the skin and subcutaneous tissue, unspecified: Secondary | ICD-10-CM

## 2019-04-26 DIAGNOSIS — Z23 Encounter for immunization: Secondary | ICD-10-CM

## 2019-04-26 DIAGNOSIS — H43813 Vitreous degeneration, bilateral: Secondary | ICD-10-CM | POA: Diagnosis not present

## 2019-04-26 DIAGNOSIS — S80811D Abrasion, right lower leg, subsequent encounter: Secondary | ICD-10-CM

## 2019-04-26 MED ORDER — METAXALONE 800 MG PO TABS: 800.0000 mg | ORAL_TABLET | Freq: Three times a day (TID) | ORAL | 2 refills | Status: DC | PRN

## 2019-04-26 NOTE — Patient Instructions (Addendum)
Tdap today  For spasm skelaxin 800  mg up to every 8 hours as needed    If not doing better to your satisfaction you will need to see [facility] othopedics
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
## 2019-04-27 ENCOUNTER — Encounter: Payer: Self-pay | Admitting: Internal Medicine

## 2019-04-27 DIAGNOSIS — G35 Multiple sclerosis: Secondary | ICD-10-CM | POA: Diagnosis not present

## 2019-04-27 DIAGNOSIS — L089 Local infection of the skin and subcutaneous tissue, unspecified: Secondary | ICD-10-CM | POA: Insufficient documentation

## 2019-04-27 DIAGNOSIS — R2689 Other abnormalities of gait and mobility: Secondary | ICD-10-CM | POA: Diagnosis not present

## 2019-04-27 DIAGNOSIS — S80811D Abrasion, right lower leg, subsequent encounter: Secondary | ICD-10-CM | POA: Insufficient documentation
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
## 2019-04-28 NOTE — Telephone Encounter (Signed)
D wert had responded to patient that he would speak with an ID specialist and get back with her.  Routing back to Dr Melvyn Novas to ensure follow up.

## 2019-04-28 NOTE — Telephone Encounter (Signed)
MW please advise.  Thanks.  

## 2019-04-29 ENCOUNTER — Ambulatory Visit: Payer: Medicare Other | Admitting: Internal Medicine

## 2019-05-02 DIAGNOSIS — G35 Multiple sclerosis: Secondary | ICD-10-CM | POA: Diagnosis not present

## 2019-05-02 DIAGNOSIS — R2689 Other abnormalities of gait and mobility: Secondary | ICD-10-CM | POA: Diagnosis not present

## 2019-05-17 DIAGNOSIS — R2689 Other abnormalities of gait and mobility: Secondary | ICD-10-CM | POA: Diagnosis not present

## 2019-05-17 DIAGNOSIS — G35 Multiple sclerosis: Secondary | ICD-10-CM | POA: Diagnosis not present

## 2019-05-19 DIAGNOSIS — G35 Multiple sclerosis: Secondary | ICD-10-CM | POA: Diagnosis not present

## 2019-05-19 DIAGNOSIS — R2689 Other abnormalities of gait and mobility: Secondary | ICD-10-CM | POA: Diagnosis not present

## 2019-05-24 DIAGNOSIS — M25572 Pain in left ankle and joints of left foot: Secondary | ICD-10-CM | POA: Diagnosis not present

## 2019-05-24 DIAGNOSIS — R2689 Other abnormalities of gait and mobility: Secondary | ICD-10-CM | POA: Diagnosis not present

## 2019-05-24 DIAGNOSIS — G35 Multiple sclerosis: Secondary | ICD-10-CM | POA: Diagnosis not present

## 2019-05-26 DIAGNOSIS — R2689 Other abnormalities of gait and mobility: Secondary | ICD-10-CM | POA: Diagnosis not present

## 2019-05-26 DIAGNOSIS — G35 Multiple sclerosis: Secondary | ICD-10-CM | POA: Diagnosis not present

## 2019-05-27 DIAGNOSIS — M8589 Other specified disorders of bone density and structure, multiple sites: Secondary | ICD-10-CM | POA: Diagnosis not present

## 2019-05-31 DIAGNOSIS — R2689 Other abnormalities of gait and mobility: Secondary | ICD-10-CM | POA: Diagnosis not present

## 2019-05-31 DIAGNOSIS — M810 Age-related osteoporosis without current pathological fracture: Secondary | ICD-10-CM | POA: Diagnosis not present

## 2019-05-31 DIAGNOSIS — E559 Vitamin D deficiency, unspecified: Secondary | ICD-10-CM | POA: Diagnosis not present

## 2019-05-31 DIAGNOSIS — R5383 Other fatigue: Secondary | ICD-10-CM | POA: Diagnosis not present

## 2019-05-31 DIAGNOSIS — G35 Multiple sclerosis: Secondary | ICD-10-CM | POA: Diagnosis not present

## 2019-06-03 ENCOUNTER — Telehealth: Payer: Self-pay | Admitting: Internal Medicine

## 2019-06-03 NOTE — Telephone Encounter (Signed)
DEXA scan dated 05/27/2019 reviewed by Dr Melvyn Novas-  Per Dr Melvyn Novas- study looks slightly worse compared to previous scan  Is she still on Prolia?- had previously been getting this every 6 months- also, who was the prescriber?  Called and spoke with the pt  She states that she can not recall who the original pre scriber was, however, she only took one dose of prolia and then was advised she could d/c b/c she was getting better. She states that she is scheduled for f/u on this with Dr Layne Benton  Soon at [facility] and this will be discussed. Will forward to Dr. Melvyn Novas to make him aware.

## 2019-06-07 DIAGNOSIS — R2689 Other abnormalities of gait and mobility: Secondary | ICD-10-CM | POA: Diagnosis not present

## 2019-06-07 DIAGNOSIS — G35 Multiple sclerosis: Secondary | ICD-10-CM | POA: Diagnosis not present

## 2019-06-09 DIAGNOSIS — G35 Multiple sclerosis: Secondary | ICD-10-CM | POA: Diagnosis not present

## 2019-06-09 DIAGNOSIS — M810 Age-related osteoporosis without current pathological fracture: Secondary | ICD-10-CM | POA: Diagnosis not present

## 2019-06-09 DIAGNOSIS — R2689 Other abnormalities of gait and mobility: Secondary | ICD-10-CM | POA: Diagnosis not present

## 2019-06-09 DIAGNOSIS — S93492A Sprain of other ligament of left ankle, initial encounter: Secondary | ICD-10-CM | POA: Diagnosis not present

## 2019-06-14 DIAGNOSIS — R2689 Other abnormalities of gait and mobility: Secondary | ICD-10-CM | POA: Diagnosis not present

## 2019-06-14 DIAGNOSIS — G35 Multiple sclerosis: Secondary | ICD-10-CM | POA: Diagnosis not present

## 2019-06-16 DIAGNOSIS — R2689 Other abnormalities of gait and mobility: Secondary | ICD-10-CM | POA: Diagnosis not present

## 2019-06-16 DIAGNOSIS — G35 Multiple sclerosis: Secondary | ICD-10-CM | POA: Diagnosis not present

## 2019-06-20 DIAGNOSIS — R2689 Other abnormalities of gait and mobility: Secondary | ICD-10-CM | POA: Diagnosis not present

## 2019-06-20 DIAGNOSIS — G35 Multiple sclerosis: Secondary | ICD-10-CM | POA: Diagnosis not present

## 2019-06-28 ENCOUNTER — Other Ambulatory Visit (HOSPITAL_COMMUNITY): Payer: Self-pay | Admitting: *Deleted

## 2019-06-29 ENCOUNTER — Ambulatory Visit (HOSPITAL_COMMUNITY)
Admission: RE | Admit: 2019-06-29 | Discharge: 2019-06-29 | Disposition: A | Discharge status: Home / Self Care | Payer: Medicare Other | Source: Ambulatory Visit | Attending: Sports Medicine | Admitting: Sports Medicine

## 2019-06-29 ENCOUNTER — Other Ambulatory Visit: Payer: Self-pay

## 2019-06-29 DIAGNOSIS — M810 Age-related osteoporosis without current pathological fracture: Secondary | ICD-10-CM | POA: Insufficient documentation

## 2019-06-29 MED ORDER — ROMOSOZUMAB-AQQG 105 MG/1.17ML ~~LOC~~ SOSY
210.0000 mg | PREFILLED_SYRINGE | Freq: Once | SUBCUTANEOUS | Status: DC
  Administered 2019-06-29: 210 mg via SUBCUTANEOUS
  Filled 2019-06-29: qty 2.4

## 2019-06-30 DIAGNOSIS — S93492D Sprain of other ligament of left ankle, subsequent encounter: Secondary | ICD-10-CM | POA: Diagnosis not present

## 2019-06-30 DIAGNOSIS — M810 Age-related osteoporosis without current pathological fracture: Secondary | ICD-10-CM | POA: Diagnosis not present

## 2019-07-08 DIAGNOSIS — D72819 Decreased white blood cell count, unspecified: Secondary | ICD-10-CM | POA: Diagnosis not present

## 2019-07-08 DIAGNOSIS — G35 Multiple sclerosis: Secondary | ICD-10-CM | POA: Diagnosis not present

## 2019-07-08 DIAGNOSIS — F988 Other specified behavioral and emotional disorders with onset usually occurring in childhood and adolescence: Secondary | ICD-10-CM | POA: Diagnosis not present

## 2019-07-08 DIAGNOSIS — E039 Hypothyroidism, unspecified: Secondary | ICD-10-CM | POA: Diagnosis not present

## 2019-07-08 DIAGNOSIS — F329 Major depressive disorder, single episode, unspecified: Secondary | ICD-10-CM | POA: Diagnosis not present

## 2019-07-08 DIAGNOSIS — G4700 Insomnia, unspecified: Secondary | ICD-10-CM | POA: Diagnosis not present

## 2019-07-08 DIAGNOSIS — D511 Vitamin B12 deficiency anemia due to selective vitamin B12 malabsorption with proteinuria: Secondary | ICD-10-CM | POA: Diagnosis not present

## 2019-07-08 DIAGNOSIS — N319 Neuromuscular dysfunction of bladder, unspecified: Secondary | ICD-10-CM | POA: Diagnosis not present

## 2019-07-14 ENCOUNTER — Other Ambulatory Visit: Payer: Self-pay | Admitting: Internal Medicine

## 2019-07-14 MED ORDER — FUROSEMIDE 20 MG PO TABS: 20.0000 mg | ORAL_TABLET | Freq: Every day | ORAL | 3 refills | Status: DC

## 2019-07-27 ENCOUNTER — Other Ambulatory Visit: Payer: Self-pay

## 2019-07-27 ENCOUNTER — Ambulatory Visit (HOSPITAL_COMMUNITY)
Admission: RE | Admit: 2019-07-27 | Discharge: 2019-07-27 | Disposition: A | Discharge status: Home / Self Care | Payer: Medicare Other | Source: Ambulatory Visit | Attending: Sports Medicine | Admitting: Sports Medicine

## 2019-07-27 DIAGNOSIS — M810 Age-related osteoporosis without current pathological fracture: Secondary | ICD-10-CM | POA: Insufficient documentation

## 2019-07-27 MED ORDER — ROMOSOZUMAB-AQQG 105 MG/1.17ML ~~LOC~~ SOSY
210.0000 mg | PREFILLED_SYRINGE | Freq: Once | SUBCUTANEOUS | Status: DC
  Administered 2019-07-27: 210 mg via SUBCUTANEOUS
  Filled 2019-07-27: qty 2.4
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
## 2019-08-01 ENCOUNTER — Other Ambulatory Visit: Payer: Self-pay | Admitting: Internal Medicine

## 2019-08-08 ENCOUNTER — Telehealth: Payer: Self-pay

## 2019-08-08 NOTE — Telephone Encounter (Signed)
I would avoid the dye altogether.  There is a protocol for using dye but it's done by the radiologist and has to be done in the hospital, not one of the outpt offices
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
## 2019-08-09 NOTE — Telephone Encounter (Signed)
Let her know I will call radiology today to figure this out and get back to her by tomorrow

## 2019-08-09 NOTE — Telephone Encounter (Signed)
Spoke with pt. She is aware of Dr. Gustavus Bryant response. Pt would like to make an appointment to discuss this. Dr. Gustavus Bryant next available in [place] is not until the end of June, pt does not want to wait that long. She asked to see Tammy Parrett in the meantime. Pt has been scheduled to see Tammy on 08/19/2019 at 1030. Nothing further was needed at this time.

## 2019-08-09 NOTE — Telephone Encounter (Signed)
LMTCB

## 2019-08-09 NOTE — Telephone Encounter (Signed)
Pt returning a phone call. Pt can be reached at [phone].

## 2019-08-10 DIAGNOSIS — D72819 Decreased white blood cell count, unspecified: Secondary | ICD-10-CM | POA: Diagnosis not present

## 2019-08-10 DIAGNOSIS — G35 Multiple sclerosis: Secondary | ICD-10-CM | POA: Diagnosis not present

## 2019-08-16 ENCOUNTER — Other Ambulatory Visit: Payer: Self-pay | Admitting: *Deleted

## 2019-08-16 MED ORDER — DEXILANT 60 MG PO CPDR: DELAYED_RELEASE_CAPSULE | ORAL | 11 refills | Status: DC

## 2019-08-19 ENCOUNTER — Encounter: Payer: Self-pay | Admitting: Adult Health

## 2019-08-19 ENCOUNTER — Other Ambulatory Visit: Payer: Self-pay

## 2019-08-19 ENCOUNTER — Other Ambulatory Visit: Payer: Self-pay | Admitting: Internal Medicine

## 2019-08-19 ENCOUNTER — Ambulatory Visit (INDEPENDENT_AMBULATORY_CARE_PROVIDER_SITE_OTHER): Payer: Medicare Other

## 2019-08-19 ENCOUNTER — Ambulatory Visit (INDEPENDENT_AMBULATORY_CARE_PROVIDER_SITE_OTHER): Payer: Medicare Other | Admitting: Adult Health

## 2019-08-19 DIAGNOSIS — R079 Chest pain, unspecified: Secondary | ICD-10-CM

## 2019-08-19 DIAGNOSIS — G35 Multiple sclerosis: Secondary | ICD-10-CM | POA: Diagnosis not present

## 2019-08-19 DIAGNOSIS — J45991 Cough variant asthma: Secondary | ICD-10-CM | POA: Diagnosis not present

## 2019-08-19 DIAGNOSIS — E038 Other specified hypothyroidism: Secondary | ICD-10-CM | POA: Diagnosis not present

## 2019-08-19 DIAGNOSIS — R0789 Other chest pain: Secondary | ICD-10-CM | POA: Diagnosis not present

## 2019-08-19 MED ORDER — LEVOTHYROXINE SODIUM 100 MCG PO TABS: ORAL_TABLET | ORAL | 5 refills | Status: DC
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
## 2019-08-19 NOTE — Assessment & Plan Note (Signed)
Continue follow-up with neurology.  Continue on current regimen.
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
## 2019-08-19 NOTE — Assessment & Plan Note (Signed)
Continue on current regimen.  Follow-up in 3 months with fasting labs.

## 2019-08-19 NOTE — Assessment & Plan Note (Signed)
Currently well controlled on present regimen.  No changes  Plan  Patient Instructions  Chest xray today  Activity as tolerated.  Warm heat to chest wall.  Continue on Dulera 2 puffs Twice daily  , rinse after use.  Continue on current regimen  Follow up with Dr. Melvyn Novas  In 4 months and As needed

## 2019-08-19 NOTE — Patient Instructions (Signed)
Chest xray today  Activity as tolerated.  Warm heat to chest wall.  Continue on Dulera 2 puffs Twice daily  , rinse after use.  Continue on current regimen  Follow up with Dr. Melvyn Novas  In 4 months and As needed

## 2019-08-24 ENCOUNTER — Encounter (HOSPITAL_COMMUNITY)
Admission: RE | Admit: 2019-08-24 | Discharge: 2019-08-24 | Disposition: A | Discharge status: Home / Self Care | Payer: Medicare Other | Source: Ambulatory Visit | Attending: Sports Medicine | Admitting: Sports Medicine

## 2019-08-24 ENCOUNTER — Telehealth: Payer: Self-pay | Admitting: *Deleted

## 2019-08-24 ENCOUNTER — Other Ambulatory Visit: Payer: Self-pay

## 2019-08-24 DIAGNOSIS — M810 Age-related osteoporosis without current pathological fracture: Secondary | ICD-10-CM | POA: Diagnosis not present

## 2019-08-24 DIAGNOSIS — S2220XA Unspecified fracture of sternum, initial encounter for closed fracture: Secondary | ICD-10-CM

## 2019-08-24 MED ORDER — ROMOSOZUMAB-AQQG 105 MG/1.17ML ~~LOC~~ SOSY
210.0000 mg | PREFILLED_SYRINGE | Freq: Once | SUBCUTANEOUS | Status: DC
  Administered 2019-08-24: 210 mg via SUBCUTANEOUS
  Filled 2019-08-24: qty 2.4
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
## 2019-08-24 NOTE — Telephone Encounter (Signed)
Pt called back-- please return call.  

## 2019-08-24 NOTE — Telephone Encounter (Signed)
lmtcb

## 2019-08-24 NOTE — Telephone Encounter (Signed)
Spoke with the pt and notified of recs per Dr. Melvyn Novas  She verbalized understanding  Was OK with referral and I have placed it

## 2019-08-25 NOTE — Telephone Encounter (Signed)
Spoke with patient and will await referral to Dr. Blase Mess , Kayla [name] is suppose to make .   Please contact office for sooner follow up if symptoms do not improve or worsen or seek emergency care

## 2019-08-26 ENCOUNTER — Ambulatory Visit: Payer: Medicare Other | Admitting: Internal Medicine

## 2019-08-31 ENCOUNTER — Encounter (INDEPENDENT_AMBULATORY_CARE_PROVIDER_SITE_OTHER): Payer: Self-pay

## 2019-09-01 ENCOUNTER — Ambulatory Visit: Payer: Medicare Other | Admitting: Internal Medicine

## 2019-09-08 ENCOUNTER — Institutional Professional Consult (permissible substitution) (INDEPENDENT_AMBULATORY_CARE_PROVIDER_SITE_OTHER): Payer: Medicare Other | Admitting: Thoracic Surgery (Cardiothoracic Vascular Surgery)

## 2019-09-08 ENCOUNTER — Encounter: Payer: Self-pay | Admitting: Thoracic Surgery (Cardiothoracic Vascular Surgery)

## 2019-09-08 ENCOUNTER — Other Ambulatory Visit: Payer: Self-pay

## 2019-09-08 DIAGNOSIS — S2220XA Unspecified fracture of sternum, initial encounter for closed fracture: Secondary | ICD-10-CM | POA: Diagnosis not present
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
## 2019-09-21 ENCOUNTER — Other Ambulatory Visit: Payer: Self-pay

## 2019-09-21 ENCOUNTER — Ambulatory Visit (HOSPITAL_COMMUNITY)
Admission: RE | Admit: 2019-09-21 | Discharge: 2019-09-21 | Disposition: A | Discharge status: Home / Self Care | Payer: Medicare Other | Source: Ambulatory Visit | Attending: Sports Medicine | Admitting: Sports Medicine

## 2019-09-21 DIAGNOSIS — M810 Age-related osteoporosis without current pathological fracture: Secondary | ICD-10-CM | POA: Insufficient documentation

## 2019-09-21 MED ORDER — ROMOSOZUMAB-AQQG 105 MG/1.17ML ~~LOC~~ SOSY
210.0000 mg | PREFILLED_SYRINGE | Freq: Once | SUBCUTANEOUS | Status: AC
  Administered 2019-09-21: 210 mg via SUBCUTANEOUS
  Filled 2019-09-21: qty 2.4

## 2019-09-22 DIAGNOSIS — R5383 Other fatigue: Secondary | ICD-10-CM | POA: Diagnosis not present

## 2019-09-22 DIAGNOSIS — M810 Age-related osteoporosis without current pathological fracture: Secondary | ICD-10-CM | POA: Diagnosis not present

## 2019-09-22 DIAGNOSIS — E559 Vitamin D deficiency, unspecified: Secondary | ICD-10-CM | POA: Diagnosis not present

## 2019-09-27 ENCOUNTER — Encounter: Payer: Medicare Other | Admitting: Thoracic Surgery (Cardiothoracic Vascular Surgery)

## 2019-10-10 ENCOUNTER — Other Ambulatory Visit: Payer: Self-pay | Admitting: Thoracic Surgery (Cardiothoracic Vascular Surgery)

## 2019-10-10 DIAGNOSIS — S2220XK Unspecified fracture of sternum, subsequent encounter for fracture with nonunion: Secondary | ICD-10-CM

## 2019-10-11 ENCOUNTER — Ambulatory Visit
Admission: RE | Admit: 2019-10-11 | Discharge: 2019-10-11 | Disposition: A | Discharge status: Home / Self Care | Payer: Medicare Other | Source: Ambulatory Visit | Attending: Thoracic Surgery (Cardiothoracic Vascular Surgery) | Admitting: Thoracic Surgery (Cardiothoracic Vascular Surgery)

## 2019-10-11 ENCOUNTER — Ambulatory Visit (INDEPENDENT_AMBULATORY_CARE_PROVIDER_SITE_OTHER): Payer: Medicare Other | Admitting: Thoracic Surgery (Cardiothoracic Vascular Surgery)

## 2019-10-11 ENCOUNTER — Other Ambulatory Visit: Payer: Self-pay

## 2019-10-11 VITALS — BP 150/62 | HR 88 | Temp 97.9°F | Resp 20 | Ht 63.0 in | Wt 124.0 lb

## 2019-10-11 DIAGNOSIS — S2220XK Unspecified fracture of sternum, subsequent encounter for fracture with nonunion: Secondary | ICD-10-CM

## 2019-10-11 DIAGNOSIS — S2220XA Unspecified fracture of sternum, initial encounter for closed fracture: Secondary | ICD-10-CM | POA: Diagnosis not present
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
## 2019-10-19 ENCOUNTER — Ambulatory Visit (HOSPITAL_COMMUNITY)
Admission: RE | Admit: 2019-10-19 | Discharge: 2019-10-19 | Disposition: A | Discharge status: Home / Self Care | Payer: Medicare Other | Source: Ambulatory Visit | Attending: Sports Medicine | Admitting: Sports Medicine

## 2019-10-19 ENCOUNTER — Other Ambulatory Visit: Payer: Self-pay

## 2019-10-19 DIAGNOSIS — M810 Age-related osteoporosis without current pathological fracture: Secondary | ICD-10-CM | POA: Diagnosis not present

## 2019-10-19 MED ORDER — ROMOSOZUMAB-AQQG 105 MG/1.17ML ~~LOC~~ SOSY
210.0000 mg | PREFILLED_SYRINGE | Freq: Once | SUBCUTANEOUS | Status: AC
  Administered 2019-10-19: 210 mg via SUBCUTANEOUS
  Filled 2019-10-19: qty 2.4

## 2019-10-24 ENCOUNTER — Ambulatory Visit: Payer: Medicare Other

## 2019-10-26 DIAGNOSIS — Z96641 Presence of right artificial hip joint: Secondary | ICD-10-CM | POA: Diagnosis not present

## 2019-10-26 DIAGNOSIS — Z471 Aftercare following joint replacement surgery: Secondary | ICD-10-CM | POA: Diagnosis not present

## 2019-10-27 ENCOUNTER — Ambulatory Visit: Payer: Medicare Other | Attending: Psychiatry | Admitting: Physical Therapy

## 2019-10-27 ENCOUNTER — Other Ambulatory Visit: Payer: Self-pay

## 2019-10-27 DIAGNOSIS — M21371 Foot drop, right foot: Secondary | ICD-10-CM | POA: Insufficient documentation

## 2019-10-27 DIAGNOSIS — R2681 Unsteadiness on feet: Secondary | ICD-10-CM | POA: Diagnosis not present

## 2019-10-27 DIAGNOSIS — M6281 Muscle weakness (generalized): Secondary | ICD-10-CM

## 2019-10-27 DIAGNOSIS — R2689 Other abnormalities of gait and mobility: Secondary | ICD-10-CM | POA: Insufficient documentation
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
## 2019-11-03 ENCOUNTER — Ambulatory Visit: Payer: Medicare Other | Admitting: Physical Therapy

## 2019-11-09 ENCOUNTER — Other Ambulatory Visit: Payer: Self-pay

## 2019-11-09 ENCOUNTER — Ambulatory Visit: Payer: Medicare Other

## 2019-11-09 DIAGNOSIS — R2689 Other abnormalities of gait and mobility: Secondary | ICD-10-CM

## 2019-11-09 DIAGNOSIS — M6281 Muscle weakness (generalized): Secondary | ICD-10-CM

## 2019-11-09 DIAGNOSIS — M21371 Foot drop, right foot: Secondary | ICD-10-CM | POA: Diagnosis not present

## 2019-11-09 DIAGNOSIS — R2681 Unsteadiness on feet: Secondary | ICD-10-CM | POA: Diagnosis not present
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
## 2019-11-11 ENCOUNTER — Other Ambulatory Visit: Payer: Self-pay

## 2019-11-11 ENCOUNTER — Ambulatory Visit: Payer: Medicare Other

## 2019-11-11 DIAGNOSIS — R2689 Other abnormalities of gait and mobility: Secondary | ICD-10-CM

## 2019-11-11 DIAGNOSIS — R2681 Unsteadiness on feet: Secondary | ICD-10-CM | POA: Diagnosis not present

## 2019-11-11 DIAGNOSIS — M6281 Muscle weakness (generalized): Secondary | ICD-10-CM | POA: Diagnosis not present

## 2019-11-11 DIAGNOSIS — M21371 Foot drop, right foot: Secondary | ICD-10-CM | POA: Diagnosis not present
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
## 2019-11-16 ENCOUNTER — Other Ambulatory Visit: Payer: Self-pay

## 2019-11-16 ENCOUNTER — Ambulatory Visit (HOSPITAL_COMMUNITY)
Admission: RE | Admit: 2019-11-16 | Discharge: 2019-11-16 | Disposition: A | Discharge status: Home / Self Care | Payer: Medicare Other | Source: Ambulatory Visit | Attending: Sports Medicine | Admitting: Sports Medicine

## 2019-11-16 DIAGNOSIS — M810 Age-related osteoporosis without current pathological fracture: Secondary | ICD-10-CM | POA: Diagnosis not present

## 2019-11-16 MED ORDER — ROMOSOZUMAB-AQQG 105 MG/1.17ML ~~LOC~~ SOSY
210.0000 mg | PREFILLED_SYRINGE | Freq: Once | SUBCUTANEOUS | Status: DC
  Administered 2019-11-16: 210 mg via SUBCUTANEOUS
  Filled 2019-11-16: qty 2.4

## 2019-11-24 ENCOUNTER — Encounter: Payer: Self-pay | Admitting: Physical Therapy

## 2019-11-24 ENCOUNTER — Other Ambulatory Visit: Payer: Self-pay

## 2019-11-24 ENCOUNTER — Ambulatory Visit: Payer: Medicare Other | Attending: Psychiatry | Admitting: Physical Therapy

## 2019-11-24 DIAGNOSIS — R2689 Other abnormalities of gait and mobility: Secondary | ICD-10-CM | POA: Insufficient documentation

## 2019-11-24 DIAGNOSIS — M6281 Muscle weakness (generalized): Secondary | ICD-10-CM | POA: Insufficient documentation

## 2019-11-24 DIAGNOSIS — R2681 Unsteadiness on feet: Secondary | ICD-10-CM | POA: Insufficient documentation

## 2019-11-24 DIAGNOSIS — M21371 Foot drop, right foot: Secondary | ICD-10-CM | POA: Diagnosis not present
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
## 2019-11-25 ENCOUNTER — Ambulatory Visit: Payer: Medicare Other | Admitting: Physical Therapy

## 2019-11-25 ENCOUNTER — Encounter: Payer: Self-pay | Admitting: Physical Therapy

## 2019-11-25 DIAGNOSIS — M6281 Muscle weakness (generalized): Secondary | ICD-10-CM | POA: Diagnosis not present

## 2019-11-25 DIAGNOSIS — R2689 Other abnormalities of gait and mobility: Secondary | ICD-10-CM

## 2019-11-25 DIAGNOSIS — M21371 Foot drop, right foot: Secondary | ICD-10-CM | POA: Diagnosis not present

## 2019-11-25 DIAGNOSIS — R2681 Unsteadiness on feet: Secondary | ICD-10-CM
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
## 2019-11-29 ENCOUNTER — Ambulatory Visit: Payer: Medicare Other

## 2019-11-30 ENCOUNTER — Other Ambulatory Visit: Payer: Self-pay | Admitting: Psychiatry

## 2019-11-30 DIAGNOSIS — G35 Multiple sclerosis: Secondary | ICD-10-CM

## 2019-12-01 ENCOUNTER — Ambulatory Visit: Payer: Medicare Other | Admitting: Physical Therapy

## 2019-12-05 ENCOUNTER — Other Ambulatory Visit: Payer: Self-pay

## 2019-12-05 ENCOUNTER — Ambulatory Visit: Payer: Medicare Other | Admitting: Physical Therapy

## 2019-12-05 ENCOUNTER — Encounter: Payer: Self-pay | Admitting: Physical Therapy

## 2019-12-05 DIAGNOSIS — M21371 Foot drop, right foot: Secondary | ICD-10-CM | POA: Diagnosis not present

## 2019-12-05 DIAGNOSIS — R2681 Unsteadiness on feet: Secondary | ICD-10-CM

## 2019-12-05 DIAGNOSIS — R2689 Other abnormalities of gait and mobility: Secondary | ICD-10-CM

## 2019-12-05 DIAGNOSIS — M6281 Muscle weakness (generalized): Secondary | ICD-10-CM | POA: Diagnosis not present
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
## 2019-12-07 ENCOUNTER — Other Ambulatory Visit: Payer: Self-pay | Admitting: Internal Medicine

## 2019-12-08 ENCOUNTER — Other Ambulatory Visit: Payer: Self-pay

## 2019-12-08 ENCOUNTER — Ambulatory Visit: Payer: Medicare Other | Admitting: Physical Therapy

## 2019-12-08 DIAGNOSIS — R2689 Other abnormalities of gait and mobility: Secondary | ICD-10-CM

## 2019-12-08 DIAGNOSIS — M21371 Foot drop, right foot: Secondary | ICD-10-CM | POA: Diagnosis not present

## 2019-12-08 DIAGNOSIS — M6281 Muscle weakness (generalized): Secondary | ICD-10-CM

## 2019-12-08 DIAGNOSIS — R2681 Unsteadiness on feet: Secondary | ICD-10-CM | POA: Diagnosis not present
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
## 2019-12-09 DIAGNOSIS — M13841 Other specified arthritis, right hand: Secondary | ICD-10-CM | POA: Diagnosis not present

## 2019-12-09 DIAGNOSIS — M13842 Other specified arthritis, left hand: Secondary | ICD-10-CM | POA: Diagnosis not present

## 2019-12-12 ENCOUNTER — Encounter: Payer: Self-pay | Admitting: Physical Therapy

## 2019-12-12 ENCOUNTER — Ambulatory Visit: Payer: Medicare Other | Admitting: Physical Therapy

## 2019-12-12 ENCOUNTER — Other Ambulatory Visit: Payer: Self-pay

## 2019-12-12 DIAGNOSIS — R2681 Unsteadiness on feet: Secondary | ICD-10-CM

## 2019-12-12 DIAGNOSIS — R2689 Other abnormalities of gait and mobility: Secondary | ICD-10-CM | POA: Diagnosis not present

## 2019-12-12 DIAGNOSIS — M6281 Muscle weakness (generalized): Secondary | ICD-10-CM

## 2019-12-12 DIAGNOSIS — M21371 Foot drop, right foot: Secondary | ICD-10-CM | POA: Diagnosis not present
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
## 2019-12-14 ENCOUNTER — Ambulatory Visit: Payer: Medicare Other | Admitting: Physical Therapy

## 2019-12-14 ENCOUNTER — Other Ambulatory Visit: Payer: Self-pay

## 2019-12-14 ENCOUNTER — Ambulatory Visit (HOSPITAL_COMMUNITY)
Admission: RE | Admit: 2019-12-14 | Discharge: 2019-12-14 | Disposition: A | Discharge status: Home / Self Care | Payer: Medicare Other | Source: Ambulatory Visit | Attending: Sports Medicine | Admitting: Sports Medicine

## 2019-12-14 DIAGNOSIS — G35 Multiple sclerosis: Secondary | ICD-10-CM | POA: Insufficient documentation

## 2019-12-14 MED ORDER — ROMOSOZUMAB-AQQG 105 MG/1.17ML ~~LOC~~ SOSY
210.0000 mg | PREFILLED_SYRINGE | Freq: Once | SUBCUTANEOUS | Status: AC
  Administered 2019-12-14: 210 mg via SUBCUTANEOUS
  Filled 2019-12-14: qty 2.4

## 2019-12-20 ENCOUNTER — Encounter: Payer: Self-pay | Admitting: Internal Medicine

## 2019-12-20 ENCOUNTER — Ambulatory Visit (INDEPENDENT_AMBULATORY_CARE_PROVIDER_SITE_OTHER): Payer: Medicare Other | Admitting: Internal Medicine

## 2019-12-20 ENCOUNTER — Other Ambulatory Visit: Payer: Self-pay

## 2019-12-20 DIAGNOSIS — E871 Hypo-osmolality and hyponatremia: Secondary | ICD-10-CM | POA: Diagnosis not present

## 2019-12-20 DIAGNOSIS — E038 Other specified hypothyroidism: Secondary | ICD-10-CM | POA: Diagnosis not present

## 2019-12-20 DIAGNOSIS — J45991 Cough variant asthma: Secondary | ICD-10-CM

## 2019-12-20 NOTE — Assessment & Plan Note (Addendum)
10/2011 132 >127 05/17/12 >130 05/20/2012 >fluid restrictions> na 132 10/20/12  - d/c'd hctz with na 126 on 08/17/14 > rx lasix > resolved 12/20/2014  - recurred  11/07/16 in setting of pushing fluids for ? uti that turned out to be bladder dysfunction ? Related to MS or MS meds   08/26/18  = 136  > recheck due but establishing with PCP soon so will be repeated then           Each maintenance medication was reviewed in detail including emphasizing most importantly the difference between maintenance and prns and under what circumstances the prns are to be triggered using an action plan format where appropriate.  Total time for H and P, chart review, counseling, teaching device and generating customized AVS unique to this office visit / charting = 23 min

## 2019-12-20 NOTE — Progress Notes (Signed)
Subjective:    Patient ID: Kayla [name], female    DOB: 1950-06-10   MRN: [id number]   Brief patient profile:  72 yowf never smoker with MS since Oct 96 all neuro care through Dr Kayla [name] in [facility] ([facility] of [facility]),   Kayla [name] at [facility] urology,  [place] [facility]  Followed here for Primary care/hypothyroid     History of Present Illness  October 04, 2009 Followup with PFT's. Pt states that her breathing is no better since last seen June 2011 for acute visit. She states she is still wheezing- ventolin helps and is using this up to tid. w/u c/w anemia  Mild dysphagia no more than usual. occ waking up wheezing at night despite zegerid at hs.   rec  Try  use dulera 100 2 bid prn   September 18 2015  R THR [name]    10/01/2015  f/u ov/Kayla [name] re: sob s/p et on dulera 100 2bid and dexilant q am  Chief Complaint  Patient presents with  . Acute Visit    Pt c/o increased DOE since had hip replacement 09/18/15. She states she gets SOB with just talking, showering or walking short distances.   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 Follow-up    Doing well and denies any co's today.   back in CPAP as of July 2017  > no change in symptoms but wakes her up a lot / f/u per Dr Kayla [name] planned  rec See Dr Kayla [name] for all issues related to your cpap machine but it appears to be working fine by your download today  No change in medications and recs     08/08/2016  f/u ov/Kayla [name] re: multiple concerns 1) check thyroid - clinically no symptoms 2) feet stay cold/ blue with neg vascular w/u to date 3) wants referral to sports medicine as needs stay hyperextended due to  MS/ no problem with falling just using cane Rec Please see patient coordinator before you leave today  to schedule referal to sports medicine and vascular surgery Please remember to go to the lab department downstairs in the basement  for your tests - we will call you with the results when they are available. Please schedule a follow up visit in 3 months but call sooner if needed     11/10/2016  f/u ov/Kayla [name] re: hyponatremia  Chief Complaint  Patient presents with  . Follow-up    recent visit to ED- 11/07/16 for hyponatremia- sodium level was 123.   Bladder dysfunction / foley dep since 11/04/16 > f/u Kayla [name] Knee hyperext better with support  Hyponatremia/ no symptoms on  Lasix daily  cpap d/c 'd 09/03/2016 with RLS not bothersome to her > neurology f/u  Asthma vs vcd    dulera 100 2bid / hfa with some sense of globus but no cough or increased sob/ noct symptoms or need for saba  rec Plan A = Automatic = dulera 100 2 pffs each am and then only take  the pm the dose if you are not doing great and/or needing your rescue inhaler  Work on inhaler technique:      Plan B = Backup Only use your albuterol (ventolin) as a rescue medication   Drink gatorade if you need to push more fluids for any reason       12/20/2019  f/u ov/Kayla [name] re:  Vcd related to Kayla [name] / AB/ hypothyroid/ Tendency to mild hyponatremia  Chief Complaint  Patient presents with  . Follow-up    Patient has been doing good since last visit, no concerns   Dyspnea:  50 ft / no longer doing much shopping other than [employer]  Cough: none Sleeping: no resp issues / one pillow  SABA use: none while dulera 100 2bid  02: none    No obvious day to day or daytime variability or assoc excess/ purulent sputum or mucus plugs or hemoptysis or cp or chest tightness, subjective wheeze or overt sinus or hb symptoms.   Sleeping  without nocturnal  or early am exacerbation  of respiratory  c/o's or need for noct saba. Also denies any obvious  fluctuation of symptoms with weather or environmental changes or other aggravating or alleviating factors except as outlined above   No unusual exposure hx or h/o childhood pna/ asthma or knowledge of premature birth.  Current Allergies, Complete Past Medical History, Past Surgical History, Family History, and Social History were reviewed in [employer] record.  ROS  The following are not active complaints unless bolded Hoarseness, sore throat, dysphagia= baseline , dental problems, itching, sneezing,  nasal congestion or discharge of excess mucus or purulent secretions, ear ache,   fever, chills, sweats, unintended wt loss or wt gain, classically pleuritic or exertional cp,  orthopnea pnd or arm/hand swelling  or leg swelling, presyncope, palpitations, abdominal pain, anorexia, nausea, vomiting, diarrhea  or change in bowel habits or change in bladder habits, change in stools or change in urine, dysuria, hematuria,  rash, arthralgias, visual complaints, headache, numbness, weakness or ataxia or problems with walking or coordination,  change in mood or  memory.        Current Meds  Medication Sig  . amphetamine-dextroamphetamine (ADDERALL) 20 MG tablet Take 20 mg by mouth 2 (two) times daily.   . baclofen (LIORESAL) 20 MG tablet Take 20 mg by mouth 3 (three) times daily.   . Calcium Carbonate-Vitamin D (CALCIUM 600+D) 600-400 MG-UNIT tablet Take 1 tablet by mouth daily.  Marland Kitchen dalfampridine 10 MG TB12   . dexlansoprazole (DEXILANT) 60 MG capsule TAKE 1 CAPSULE ONCE DAILY WITH BREAKFAST.  . DULERA 100-5 MCG/ACT AERO USE 2 PUFFS EVERY 12 HOURS.  Marland Kitchen FLUoxetine (PROZAC) 40 MG capsule Take 40 mg by mouth daily.    . furosemide (LASIX) 20 MG tablet Take 1 tablet (20 mg total) by mouth daily.  Marland Kitchen gabapentin (NEURONTIN) 600 MG tablet Take 600 mg by mouth 2 (two) times daily.  Marland Kitchen levothyroxine (SYNTHROID) 100 MCG tablet TAKE 1 TABLET BY MOUTH ONCE DAILY BEFORE BREAKFAST  . Multiple  Vitamin (MULTIVITAMIN WITH MINERALS) TABS tablet Take 1 tablet by mouth daily.  Marland Kitchen MYRBETRIQ 25 MG TB24 tablet Take 1 tablet by mouth daily.  Marland Kitchen ocrelizumab (OCREVUS) 300 MG/10ML injection Inject 300 mg into the vein every 6 (six) months.   . Respiratory Therapy Supplies (FLUTTER) DEVI Use as directed  . temazepam (RESTORIL) 15 MG capsule Take 15 mg by mouth at bedtime.   . VENTOLIN HFA 108 (90 Base)  MCG/ACT inhaler INHALE 2 PUFFS INTO LUNGS EVERY 6 HOURS AS NEEDED.                   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  [place]  Bladder dysfunction ...............................................Marland Kitchen  Kayla [name]  Dyspnea/wheeze ? asthma  - HFA 50 % after coaching October 04, 2009 >  50% 06/25/2010  - PFT's October 04, 2009 FEV1 2.15 (95%) with ratio 69 and DLCO 58> corrects to 98, mild insp truncation  Health Maintenance.............................................Marland KitchenWert  - Pneumovax March 28, 2009 and Prevnar 13 11/06/2015 and pneumovax 04/26/2019  - Td  04/26/2019  - CPX 11/06/2015        Objective:   Physical Exam    hoarse wf  With baseline stridor / amb with rollator  Vital signs reviewed  12/20/2019  - Note at rest 02 sats  99% on RA     12/20/2019  122 04/26/2019    126  08/26/2018    122 Wt  118 > 127 March 28, 2009 >129 June 17 > 128 October 04, 2009 > 129 06/25/2010 >125 10/29/2010 > 128 11/26/2010 >>127 12/10/2010 > 11/17/2011  129> 126 05/20/2012 > 07/29/2012  129 > 10/20/2012  129 >  135 06/21/2013 >  11/04/2013 135 >134 04/04/2014 > 04/18/2014 135 >  07/17/2014 132> 08/17/2014  135 > 12/20/2014 >  01/17/2015 134   > 08/08/2015   126 >  10/01/2015   130 > 11/06/2015   130 > 11/10/2016  123 > 02/10/2017  133  > 05/14/2017   129 > 08/11/2017  125 > 11/24/2017  127 >  02/23/2018  127 >  03/29/2018   128       HEENT : pt wearing mask not removed for exam due to covid -19 concerns.    NECK :  without JVD/Nodes/TM/ nl carotid upstrokes bilaterally   LUNGS: no acc muscle use,  Nl contour chest  Transmitted stridor from upper airways  without cough on insp or exp maneuvers   CV:  RRR  no s3 or murmur or increase in P2, and no edema   ABD:  soft and nontender with nl inspiratory excursion in the supine position. No bruits or organomegaly appreciated, bowel sounds nl  MS:    ext warm without deformities, calf tenderness, cyanosis or clubbing No obvious joint restrictions   SKIN: warm and dry with venous congestion pattern both LE   NEURO:  alert, approp, nl sensorium with  no motor or cerebellar deficits apparent.                        Assessment & Plan:

## 2019-12-20 NOTE — Assessment & Plan Note (Signed)
Onset 2011  With pfts 09/2009 With minimal airflow obst and insp truncation c/w vcd  - 11/10/2016 rec try just taking 2 pffs each am since having sense of globus on 2bid with baseline poor technique - flutter valve rec 05/14/2017  - 08/26/2018  After extensive coaching inhaler device,  effectiveness =    75% (short Ti)   All goals of chronic asthma control met including optimal function and elimination of symptoms with minimal need for rescue therapy.  Contingencies discussed in full including contacting this office immediately if not controlling the symptoms using the rule of two's.

## 2019-12-20 NOTE — Assessment & Plan Note (Signed)
Lab Results  Component Value Date   TSH 1.73 08/26/2018     Adequate control on present rx, reviewed in detail with pt > no change in rx needed  For now and plans to establish with [place] PCP who will do f/u

## 2019-12-20 NOTE — Patient Instructions (Signed)
If you are satisfied with your treatment plan,  let your doctor know and he/she can either refill your medications or you can return here when your prescription runs out.     If in any way you are not 100% satisfied,  please tell us.  If 100% better, tell your friends!  Pulmonary follow up is as needed   

## 2019-12-21 ENCOUNTER — Ambulatory Visit: Payer: Medicare Other | Admitting: Physical Therapy

## 2019-12-21 ENCOUNTER — Ambulatory Visit
Admission: RE | Admit: 2019-12-21 | Discharge: 2019-12-21 | Disposition: A | Discharge status: Home / Self Care | Payer: Medicare Other | Source: Ambulatory Visit | Attending: Psychiatry | Admitting: Psychiatry

## 2019-12-21 DIAGNOSIS — G35 Multiple sclerosis: Secondary | ICD-10-CM | POA: Diagnosis not present

## 2019-12-21 MED ORDER — GADOBENATE DIMEGLUMINE 529 MG/ML IV SOLN
11.0000 mL | Freq: Once | INTRAVENOUS | Status: AC | PRN
  Administered 2019-12-21: 11 mL via INTRAVENOUS

## 2019-12-22 ENCOUNTER — Ambulatory Visit: Payer: Medicare Other | Admitting: Physical Therapy

## 2019-12-22 DIAGNOSIS — Z23 Encounter for immunization: Secondary | ICD-10-CM | POA: Diagnosis not present

## 2019-12-22 DIAGNOSIS — M25511 Pain in right shoulder: Secondary | ICD-10-CM | POA: Diagnosis not present

## 2019-12-22 DIAGNOSIS — G4700 Insomnia, unspecified: Secondary | ICD-10-CM | POA: Diagnosis not present

## 2019-12-22 DIAGNOSIS — D849 Immunodeficiency, unspecified: Secondary | ICD-10-CM | POA: Diagnosis not present

## 2019-12-22 DIAGNOSIS — F988 Other specified behavioral and emotional disorders with onset usually occurring in childhood and adolescence: Secondary | ICD-10-CM | POA: Diagnosis not present

## 2019-12-22 DIAGNOSIS — F329 Major depressive disorder, single episode, unspecified: Secondary | ICD-10-CM | POA: Diagnosis not present

## 2019-12-22 DIAGNOSIS — D72819 Decreased white blood cell count, unspecified: Secondary | ICD-10-CM | POA: Diagnosis not present

## 2019-12-22 DIAGNOSIS — G35 Multiple sclerosis: Secondary | ICD-10-CM | POA: Diagnosis not present

## 2019-12-22 DIAGNOSIS — N319 Neuromuscular dysfunction of bladder, unspecified: Secondary | ICD-10-CM | POA: Diagnosis not present

## 2019-12-26 ENCOUNTER — Ambulatory Visit: Payer: Medicare Other | Attending: Psychiatry | Admitting: Physical Therapy

## 2019-12-26 ENCOUNTER — Encounter: Payer: Self-pay | Admitting: Physical Therapy

## 2019-12-26 ENCOUNTER — Other Ambulatory Visit: Payer: Self-pay

## 2019-12-26 DIAGNOSIS — R2681 Unsteadiness on feet: Secondary | ICD-10-CM | POA: Diagnosis not present

## 2019-12-26 DIAGNOSIS — M21371 Foot drop, right foot: Secondary | ICD-10-CM | POA: Diagnosis not present

## 2019-12-26 DIAGNOSIS — M6281 Muscle weakness (generalized): Secondary | ICD-10-CM | POA: Diagnosis not present

## 2019-12-26 DIAGNOSIS — R2689 Other abnormalities of gait and mobility: Secondary | ICD-10-CM | POA: Diagnosis not present
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
## 2019-12-26 NOTE — Addendum Note (Signed)
Addended by: Rico Junker on: 12/26/2019 06:08 PM   Modules accepted: Orders

## 2020-01-06 ENCOUNTER — Ambulatory Visit: Payer: Medicare Other | Admitting: Physical Therapy

## 2020-01-06 DIAGNOSIS — M21371 Foot drop, right foot: Secondary | ICD-10-CM | POA: Diagnosis not present

## 2020-01-06 DIAGNOSIS — R2681 Unsteadiness on feet: Secondary | ICD-10-CM | POA: Diagnosis not present

## 2020-01-06 DIAGNOSIS — M6281 Muscle weakness (generalized): Secondary | ICD-10-CM

## 2020-01-06 DIAGNOSIS — R2689 Other abnormalities of gait and mobility: Secondary | ICD-10-CM

## 2020-01-06 NOTE — Patient Instructions (Addendum)
Access Code: [email or web address] URL: [email or web address] Date: 01/06/2020 Prepared by: Misty Stanley  Exercises Sit to Stand with Arm Reach Toward Target - 1 x daily - 7 x weekly - 1 sets - 10 reps Standing Alternating Knee Flexion - 1 x daily - 7 x weekly - 1 sets - 10 reps Standing Hip Abduction with Counter Support - 1 x daily - 7 x weekly - 3 sets - 10 reps Stride Stance Weight Shift - 1 x daily - 7 x weekly - 1 sets - 10 reps Romberg Stance with Head Rotation - 1 x daily - 7 x weekly - 2 sets - 4 reps
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
## 2020-01-11 ENCOUNTER — Other Ambulatory Visit: Payer: Self-pay

## 2020-01-11 ENCOUNTER — Ambulatory Visit (HOSPITAL_COMMUNITY)
Admission: RE | Admit: 2020-01-11 | Discharge: 2020-01-11 | Disposition: A | Discharge status: Home / Self Care | Payer: Medicare Other | Source: Ambulatory Visit | Attending: Sports Medicine | Admitting: Sports Medicine

## 2020-01-11 DIAGNOSIS — M810 Age-related osteoporosis without current pathological fracture: Secondary | ICD-10-CM | POA: Diagnosis not present

## 2020-01-11 MED ORDER — ROMOSOZUMAB-AQQG 105 MG/1.17ML ~~LOC~~ SOSY
210.0000 mg | PREFILLED_SYRINGE | Freq: Once | SUBCUTANEOUS | Status: DC
  Administered 2020-01-11: 210 mg via SUBCUTANEOUS
  Filled 2020-01-11: qty 2.4

## 2020-01-16 DIAGNOSIS — D849 Immunodeficiency, unspecified: Secondary | ICD-10-CM | POA: Diagnosis not present

## 2020-01-19 ENCOUNTER — Other Ambulatory Visit: Payer: Self-pay

## 2020-01-20 ENCOUNTER — Ambulatory Visit (INDEPENDENT_AMBULATORY_CARE_PROVIDER_SITE_OTHER): Payer: Medicare Other | Admitting: Family Medicine

## 2020-01-20 ENCOUNTER — Encounter: Payer: Self-pay | Admitting: Family Medicine

## 2020-01-20 VITALS — BP 118/66 | HR 74 | Temp 98.4°F | Ht 64.0 in | Wt 125.8 lb

## 2020-01-20 DIAGNOSIS — G4761 Periodic limb movement disorder: Secondary | ICD-10-CM | POA: Diagnosis not present

## 2020-01-20 DIAGNOSIS — G35 Multiple sclerosis: Secondary | ICD-10-CM

## 2020-01-20 DIAGNOSIS — Z23 Encounter for immunization: Secondary | ICD-10-CM | POA: Diagnosis not present

## 2020-01-20 DIAGNOSIS — R339 Retention of urine, unspecified: Secondary | ICD-10-CM

## 2020-01-20 DIAGNOSIS — J45991 Cough variant asthma: Secondary | ICD-10-CM

## 2020-01-20 DIAGNOSIS — E038 Other specified hypothyroidism: Secondary | ICD-10-CM

## 2020-01-20 NOTE — Progress Notes (Signed)
Kayla [name] is a 69 y.o. female  Chief Complaint  Patient presents with  . Establish Care    TOC- establish care. no concerns.     HPI: Kayla [name] is a 69 y.o. female seen today to establish care with our office.  Pt has a PMHx significant for MS, COPD, hypothyroidism, osteoporosis, OA. Married, 2 grown daughters, works part time for a [employer].  Specialists: Neuro - Dr Jacqulynn Cadet in [facility]) Urology - Dr. Louis Meckel ([facility]) ENT - Dr. [name] [facility]) Pulm - Dr. Melvyn Novas ([facility]) Ortho - [facility] - Dr. Hinton Rao   Last PAP: s/p TAH d/t cervical cancer in 2005 Last mammo: has appt in 1 week Last Dexa: 05/2019 - T-score = -2.4 - on monthly shots Evenity Last colonoscopy: 05/2015 - Dr. Fuller Plan w/ [facility] - repeat in 5 years  Med refills needed today: none  She is getting covid booster this afternoon. She has appt for shingrix #2 in about 1 week  Past Medical History:  Diagnosis Date  . Arthritis    oa  . Cervical cancer ([place]) 2005  . COPD (chronic obstructive pulmonary disease) ([place])   . Multiple sclerosis ([place])   . Osteoporosis   . Other diseases of vocal cords    vocal cords are not even  . Other voice and resonance disorders   . Pneumonia 2014, 1992  . Sleep apnea    no cpap used  . Unspecified hypothyroidism     Past Surgical History:  Procedure Laterality Date  . ABDOMINAL HYSTERECTOMY  2005   for cervical cancer, complete  . CONVERSION TO TOTAL HIP Right 09/18/2015   Procedure: CONVERSION OF RIGHT HEMI ARTHROPLASTY TO TOTAL HIP ARTHROPLASTY ACETABULAR REVISON ;  Surgeon: Paralee Cancel, MD;  [facility];  Service: Orthopedics;  Laterality: Right;  . EYE SURGERY Bilateral    lens for cataracts  . HIP ARTHROPLASTY Right 03/01/2013   Procedure: ARTHROPLASTY BIPOLAR HIP;  Surgeon: Mauri Pole, MD;  [facility];  Service: Orthopedics;  Laterality: Right;  . HIP CLOSED REDUCTION Right 03/12/2013   Procedure: CLOSED  REDUCTION HIP;  Surgeon: Gearlean Alf, MD;  Location: [facility];  Service: Orthopedics;  Laterality: Right;  . HIP CLOSED REDUCTION Right 03/19/2013   Procedure: CLOSED REDUCTION HIP;  Surgeon: Johnn Hai, MD;  Location: [facility];  Service: Orthopedics;  Laterality: Right;  . HIP CLOSED REDUCTION Right 11/10/2015   Procedure: CLOSED MANIPULATION HIP;  Surgeon: Rod Can, MD;  [facility];  Service: Orthopedics;  Laterality: Right;  . HIP CLOSED REDUCTION Right 06/05/2017   Procedure: CLOSED REDUCTION HIP;  Surgeon: [name], MD;  [facility];  Service: Orthopedics;  Laterality: Right;  . TOTAL HIP REVISION Right 06/06/2017   Procedure: TOTAL HIP REVISION;  Surgeon: [name], MD;  [facility];  Service: Orthopedics;  Laterality: Right;  Marland Kitchen VESICOVAGINAL FISTULA CLOSURE W/ TAH  2005    Social History   Socioeconomic History  . Marital status: Married    Spouse name: Not on file  . Number of children: Not on file  . Years of education: Not on file  . Highest education level: Not on file  Occupational History  . 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 Smoking status: Never Smoker  . Smokeless tobacco: Never Used  Vaping Use  . Vaping Use: Never used  Substance and Sexual Activity  . Alcohol use: Yes  Alcohol/week: 2.0 standard drinks    Types: 1 Glasses of wine, 1 Cans of beer per week    Comment: 2x/week   . Drug use: No  . Sexual activity: Not on file  Other Topics Concern  . Not on file  Social History Narrative  . Not on file   Social Determinants of Health   Financial Resource Strain:   . Difficulty of Paying Living Expenses: Not on file  Food Insecurity:   . Worried About Charity fundraiser in the Last Year: Not on file  . Ran Out of Food in the Last Year: Not on file  Transportation Needs:   . Lack of Transportation (Medical): Not on file  . Lack of Transportation (Non-Medical): Not on file  Physical Activity:   .  Days of Exercise per Week: Not on file  . Minutes of Exercise per Session: Not on file  Stress:   . Feeling of Stress : Not on file  Social Connections:   . Frequency of Communication with Friends and Family: Not on file  . Frequency of Social Gatherings with Friends and Family: Not on file  . Attends Religious Services: Not on file  . Active Member of Clubs or Organizations: Not on file  . Attends Archivist Meetings: Not on file  . Marital Status: Not on file  Intimate Partner Violence:   . Fear of Current or Ex-Partner: Not on file  . Emotionally Abused: Not on file  . Physically Abused: Not on file  . Sexually Abused: Not on file    Family History  Problem Relation Age of Onset  . Colon cancer Father      Immunization History  Administered Date(s) Administered  . DTP 04/10/2009  . Fluad Quad(high Dose 65+) 12/29/2018  . Influenza Split 12/10/2010, 02/11/2012, 12/22/2012, 01/02/2014, 12/22/2014  . Influenza Whole 02/19/2005, 03/28/2009, 01/23/2016  . Influenza, High Dose Seasonal PF 02/23/2018  . Influenza,inj,Quad PF,6+ Mos 02/10/2017  . Influenza-Unspecified 12/02/2018, 12/31/2018, 01/08/2019  . Moderna SARS-COVID-2 Vaccination 04/29/2019, 06/01/2019  . Pneumococcal Conjugate-13 11/06/2015  . Pneumococcal Polysaccharide-23 03/28/2009, 04/26/2019  . Tdap 04/26/2019  . Zoster Recombinat (Shingrix) 09/07/2019    Outpatient Encounter Medications as of 01/20/2020  Medication Sig  . amphetamine-dextroamphetamine (ADDERALL) 20 MG tablet Take 20 mg by mouth 2 (two) times daily.   . baclofen (LIORESAL) 20 MG tablet Take 20 mg by mouth 3 (three) times daily.   . Calcium Carbonate-Vitamin D (CALCIUM 600+D) 600-400 MG-UNIT tablet Take 1 tablet by mouth daily.  Marland Kitchen dalfampridine 10 MG TB12   . dexlansoprazole (DEXILANT) 60 MG capsule TAKE 1 CAPSULE ONCE DAILY WITH BREAKFAST.  . DULERA 100-5 MCG/ACT AERO USE 2 PUFFS EVERY 12 HOURS.  Marland Kitchen FLUoxetine (PROZAC) 40 MG capsule  Take 40 mg by mouth daily.    . furosemide (LASIX) 20 MG tablet Take 1 tablet (20 mg total) by mouth daily.  Marland Kitchen gabapentin (NEURONTIN) 600 MG tablet Take 600 mg by mouth 2 (two) times daily.  Marland Kitchen levothyroxine (SYNTHROID) 100 MCG tablet TAKE 1 TABLET BY MOUTH ONCE DAILY BEFORE BREAKFAST  . Multiple Vitamin (MULTIVITAMIN WITH MINERALS) TABS tablet Take 1 tablet by mouth daily.  Marland Kitchen MYRBETRIQ 25 MG TB24 tablet Take 1 tablet by mouth daily.  Marland Kitchen ocrelizumab (OCREVUS) 300 MG/10ML injection Inject 300 mg into the vein every 6 (six) months.   . Respiratory Therapy Supplies (FLUTTER) DEVI Use as directed  . Romosozumab-aqqg (EVENITY) 105 MG/1.17ML SOSY injection Inject 210 mg into the skin once.  Gets 2 shots a month  . solifenacin (VESICARE) 10 MG tablet Take 10 mg by mouth daily.  . temazepam (RESTORIL) 15 MG capsule Take 15 mg by mouth at bedtime.   . VENTOLIN HFA 108 (90 Base) MCG/ACT inhaler INHALE 2 PUFFS INTO LUNGS EVERY 6 HOURS AS NEEDED.   No facility-administered encounter medications on file as of 01/20/2020.     ROS: Pertinent positives and negatives noted in HPI. Remainder of ROS non-contributory    Allergies  Allergen Reactions  . Tramadol     hallucinations   . Contrast Media [Iodinated Diagnostic Agents] Rash  . Iohexol Hives  . Methylprednisolone Other (See Comments)    Reaction:  Decreases pts heart rate   . Morphine And Related Other (See Comments)    Reaction:  Hallucinations   . Oxycodone-Acetaminophen Rash and Other (See Comments)    Reaction:  Hallucinations   . Phenytoin Sodium Extended Rash  . Zanaflex [Tizanidine Hydrochloride] Rash    BP 118/66   Pulse 74   Temp 98.4 F (36.9 C) (Temporal)   Ht 5\' 4"  (1.626 m)   Wt 125 lb 12.8 oz (57.1 kg)   SpO2 94%   BMI 21.59 kg/m   Physical Exam Constitutional:      General: She is not in acute distress.    Appearance: Normal appearance. She is not ill-appearing.  Pulmonary:     Effort: No respiratory distress.    Neurological:     Mental Status: She is alert and oriented to person, place, and time. Mental status is at baseline.  Psychiatric:        Mood and Affect: Mood normal.        Behavior: Behavior normal.      A/P:  1. Multiple sclerosis ([place]) - follows with neuro on regular basis - ambulates with walker - cont ocrevus q48mo, dalfampridine 10mg  BID  2. Hypothyroidism - stable x years  - cont levothyroxine 147mcg daily - will plan for TFTs at next OV in 6 mo  3. Periodic limb movements of sleep - at baseline, stable - on restoril  4. Urinary retention with incomplete bladder emptying  5. Cough variant asthma vs vcd  related to MS  - follows with pulm Dr. Melvyn Novas - cont dulera  6. Osteoporosis - on evenity monthly - last dexa in 05/2019  This visit occurred during the SARS-CoV-2 public health emergency.  Safety protocols were in place, including screening questions prior to the visit, additional usage of staff PPE, and extensive cleaning of exam room while observing appropriate contact time as indicated for disinfecting solutions.

## 2020-01-25 DIAGNOSIS — M359 Systemic involvement of connective tissue, unspecified: Secondary | ICD-10-CM | POA: Diagnosis not present

## 2020-01-25 DIAGNOSIS — N319 Neuromuscular dysfunction of bladder, unspecified: Secondary | ICD-10-CM | POA: Diagnosis not present

## 2020-01-25 DIAGNOSIS — K117 Disturbances of salivary secretion: Secondary | ICD-10-CM | POA: Diagnosis not present

## 2020-01-25 DIAGNOSIS — F988 Other specified behavioral and emotional disorders with onset usually occurring in childhood and adolescence: Secondary | ICD-10-CM | POA: Diagnosis not present

## 2020-01-25 DIAGNOSIS — D72819 Decreased white blood cell count, unspecified: Secondary | ICD-10-CM | POA: Diagnosis not present

## 2020-01-25 DIAGNOSIS — D849 Immunodeficiency, unspecified: Secondary | ICD-10-CM | POA: Diagnosis not present

## 2020-01-25 DIAGNOSIS — F32A Depression, unspecified: Secondary | ICD-10-CM | POA: Diagnosis not present

## 2020-01-25 DIAGNOSIS — G35 Multiple sclerosis: Secondary | ICD-10-CM | POA: Diagnosis not present

## 2020-01-25 DIAGNOSIS — G4700 Insomnia, unspecified: Secondary | ICD-10-CM | POA: Diagnosis not present

## 2020-01-27 DIAGNOSIS — Z1231 Encounter for screening mammogram for malignant neoplasm of breast: Secondary | ICD-10-CM | POA: Diagnosis not present

## 2020-01-30 ENCOUNTER — Ambulatory Visit: Payer: Medicare Other | Attending: Psychiatry | Admitting: Physical Therapy

## 2020-01-30 ENCOUNTER — Other Ambulatory Visit: Payer: Self-pay

## 2020-01-30 DIAGNOSIS — M21371 Foot drop, right foot: Secondary | ICD-10-CM | POA: Insufficient documentation

## 2020-01-30 DIAGNOSIS — R2689 Other abnormalities of gait and mobility: Secondary | ICD-10-CM | POA: Diagnosis not present

## 2020-01-30 DIAGNOSIS — R2681 Unsteadiness on feet: Secondary | ICD-10-CM | POA: Insufficient documentation

## 2020-01-30 DIAGNOSIS — M6281 Muscle weakness (generalized): Secondary | ICD-10-CM | POA: Insufficient documentation
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
## 2020-02-02 ENCOUNTER — Encounter: Payer: Self-pay | Admitting: Physical Therapy

## 2020-02-02 ENCOUNTER — Other Ambulatory Visit: Payer: Self-pay

## 2020-02-02 ENCOUNTER — Ambulatory Visit: Payer: Medicare Other | Admitting: Physical Therapy

## 2020-02-02 DIAGNOSIS — R2681 Unsteadiness on feet: Secondary | ICD-10-CM | POA: Diagnosis not present

## 2020-02-02 DIAGNOSIS — R2689 Other abnormalities of gait and mobility: Secondary | ICD-10-CM | POA: Diagnosis not present

## 2020-02-02 DIAGNOSIS — M21371 Foot drop, right foot: Secondary | ICD-10-CM

## 2020-02-02 DIAGNOSIS — M6281 Muscle weakness (generalized): Secondary | ICD-10-CM | POA: Diagnosis not present
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
## 2020-02-02 NOTE — Patient Instructions (Addendum)
Access Code: [email or web address] URL: [email or web address] Date: 02/02/2020 Prepared by: Misty Stanley  Exercises Sit to Stand with Arm Reach Toward Target - 1 x daily - 7 x weekly - 4 sets - 3-4 reps Standing Alternating Knee Flexion - 1 x daily - 7 x weekly - 5 sets - 5 reps Standing Hip Abduction with Counter Support - 1 x daily - 7 x weekly - 5 sets - 6 reps Stride Stance Weight Shift - 1 x daily - 7 x weekly - 5 sets - 5-6 reps Romberg Stance with Head Rotation - 1 x daily - 7 x weekly - 4 sets - 6-7 reps

## 2020-02-06 ENCOUNTER — Other Ambulatory Visit: Payer: Self-pay

## 2020-02-06 ENCOUNTER — Ambulatory Visit: Payer: Medicare Other | Admitting: Physical Therapy

## 2020-02-06 ENCOUNTER — Encounter: Payer: Self-pay | Admitting: Physical Therapy

## 2020-02-06 DIAGNOSIS — R2681 Unsteadiness on feet: Secondary | ICD-10-CM | POA: Diagnosis not present

## 2020-02-06 DIAGNOSIS — M21371 Foot drop, right foot: Secondary | ICD-10-CM | POA: Diagnosis not present

## 2020-02-06 DIAGNOSIS — M6281 Muscle weakness (generalized): Secondary | ICD-10-CM | POA: Diagnosis not present

## 2020-02-06 DIAGNOSIS — R2689 Other abnormalities of gait and mobility: Secondary | ICD-10-CM | POA: Diagnosis not present
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
## 2020-02-08 ENCOUNTER — Other Ambulatory Visit: Payer: Self-pay

## 2020-02-08 ENCOUNTER — Ambulatory Visit (HOSPITAL_COMMUNITY)
Admission: RE | Admit: 2020-02-08 | Discharge: 2020-02-08 | Disposition: A | Discharge status: Home / Self Care | Payer: Medicare Other | Source: Ambulatory Visit | Attending: Sports Medicine | Admitting: Sports Medicine

## 2020-02-08 DIAGNOSIS — M810 Age-related osteoporosis without current pathological fracture: Secondary | ICD-10-CM | POA: Diagnosis not present

## 2020-02-08 MED ORDER — ROMOSOZUMAB-AQQG 105 MG/1.17ML ~~LOC~~ SOSY
210.0000 mg | PREFILLED_SYRINGE | Freq: Once | SUBCUTANEOUS | Status: DC
  Administered 2020-02-08: 210 mg via SUBCUTANEOUS
  Filled 2020-02-08: qty 2.4

## 2020-02-09 ENCOUNTER — Encounter: Payer: Self-pay | Admitting: Physical Therapy

## 2020-02-09 ENCOUNTER — Ambulatory Visit: Payer: Medicare Other | Admitting: Physical Therapy

## 2020-02-09 DIAGNOSIS — R2689 Other abnormalities of gait and mobility: Secondary | ICD-10-CM

## 2020-02-09 DIAGNOSIS — R2681 Unsteadiness on feet: Secondary | ICD-10-CM

## 2020-02-09 DIAGNOSIS — M6281 Muscle weakness (generalized): Secondary | ICD-10-CM | POA: Diagnosis not present

## 2020-02-09 DIAGNOSIS — M21371 Foot drop, right foot: Secondary | ICD-10-CM
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
## 2020-02-10 DIAGNOSIS — G35 Multiple sclerosis: Secondary | ICD-10-CM | POA: Diagnosis not present

## 2020-02-10 DIAGNOSIS — H35373 Puckering of macula, bilateral: Secondary | ICD-10-CM | POA: Diagnosis not present

## 2020-02-13 ENCOUNTER — Ambulatory Visit: Payer: Medicare Other | Admitting: Physical Therapy

## 2020-02-13 ENCOUNTER — Other Ambulatory Visit: Payer: Self-pay

## 2020-02-13 ENCOUNTER — Encounter: Payer: Self-pay | Admitting: Physical Therapy

## 2020-02-13 DIAGNOSIS — R2689 Other abnormalities of gait and mobility: Secondary | ICD-10-CM

## 2020-02-13 DIAGNOSIS — M21371 Foot drop, right foot: Secondary | ICD-10-CM | POA: Diagnosis not present

## 2020-02-13 DIAGNOSIS — R2681 Unsteadiness on feet: Secondary | ICD-10-CM

## 2020-02-13 DIAGNOSIS — M6281 Muscle weakness (generalized): Secondary | ICD-10-CM | POA: Diagnosis not present
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
## 2020-02-14 ENCOUNTER — Encounter: Payer: Self-pay | Admitting: Physical Therapy

## 2020-02-14 ENCOUNTER — Ambulatory Visit: Payer: Medicare Other | Admitting: Physical Therapy

## 2020-02-14 DIAGNOSIS — M6281 Muscle weakness (generalized): Secondary | ICD-10-CM

## 2020-02-14 DIAGNOSIS — R2689 Other abnormalities of gait and mobility: Secondary | ICD-10-CM

## 2020-02-14 DIAGNOSIS — R2681 Unsteadiness on feet: Secondary | ICD-10-CM

## 2020-02-14 DIAGNOSIS — M21371 Foot drop, right foot: Secondary | ICD-10-CM
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
## 2020-02-14 NOTE — Patient Instructions (Signed)
Access Code: [email or web address] URL: [email or web address] Date: 02/14/2020 Prepared by: Misty Stanley  Exercises Sit to Stand with Arm Reach Toward Target - 1 x daily - 7 x weekly - 4 sets - 3-4 reps Standing Alternating Knee Flexion - 1 x daily - 7 x weekly - 5 sets - 5 reps Standing Hip Abduction with Counter Support - 1 x daily - 7 x weekly - 5 sets - 6 reps Stride Stance Weight Shift - 1 x daily - 7 x weekly - 5 sets - 5-6 reps Romberg Stance on Foam Pad with Head Rotation - 1 x daily - 7 x weekly - 2 sets - 10 reps Romberg Stance with Head Nods on Foam Pad - 1 x daily - 7 x weekly - 2 sets - 10 reps Narrow Base of Support Standing Balance Eyes Closed on Foam Pad - 1 x daily - 7 x weekly - 2 sets - 10 hold

## 2020-02-20 ENCOUNTER — Ambulatory Visit: Payer: Medicare Other | Admitting: Physical Therapy

## 2020-02-23 ENCOUNTER — Other Ambulatory Visit: Payer: Self-pay

## 2020-02-23 ENCOUNTER — Ambulatory Visit: Payer: Medicare Other | Attending: Psychiatry | Admitting: Physical Therapy

## 2020-02-23 ENCOUNTER — Encounter: Payer: Self-pay | Admitting: Physical Therapy

## 2020-02-23 DIAGNOSIS — R2681 Unsteadiness on feet: Secondary | ICD-10-CM | POA: Diagnosis not present

## 2020-02-23 DIAGNOSIS — M21371 Foot drop, right foot: Secondary | ICD-10-CM | POA: Diagnosis not present

## 2020-02-23 DIAGNOSIS — R278 Other lack of coordination: Secondary | ICD-10-CM | POA: Insufficient documentation

## 2020-02-23 DIAGNOSIS — M6281 Muscle weakness (generalized): Secondary | ICD-10-CM | POA: Diagnosis not present

## 2020-02-23 DIAGNOSIS — R2689 Other abnormalities of gait and mobility: Secondary | ICD-10-CM | POA: Diagnosis not present
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
## 2020-03-01 ENCOUNTER — Other Ambulatory Visit: Payer: Self-pay | Admitting: Internal Medicine

## 2020-03-01 MED ORDER — DULERA 100-5 MCG/ACT IN AERO: INHALATION_SPRAY | RESPIRATORY_TRACT | 3 refills | Status: DC

## 2020-03-05 ENCOUNTER — Telehealth: Payer: Self-pay | Admitting: Family Medicine

## 2020-03-05 NOTE — Telephone Encounter (Signed)
Left message for patient to schedule Annual Wellness Visit.  Please schedule with Nurse Health Advisor Martha Stanley, RN at  [facility]  °

## 2020-03-07 ENCOUNTER — Ambulatory Visit (HOSPITAL_COMMUNITY)
Admission: RE | Admit: 2020-03-07 | Discharge: 2020-03-07 | Disposition: A | Discharge status: Home / Self Care | Payer: Medicare Other | Source: Ambulatory Visit | Attending: Sports Medicine | Admitting: Sports Medicine

## 2020-03-07 ENCOUNTER — Other Ambulatory Visit: Payer: Self-pay

## 2020-03-07 DIAGNOSIS — M810 Age-related osteoporosis without current pathological fracture: Secondary | ICD-10-CM | POA: Diagnosis not present

## 2020-03-07 MED ORDER — ROMOSOZUMAB-AQQG 105 MG/1.17ML ~~LOC~~ SOSY
210.0000 mg | PREFILLED_SYRINGE | Freq: Once | SUBCUTANEOUS | Status: DC
  Administered 2020-03-07: 210 mg via SUBCUTANEOUS
  Filled 2020-03-07: qty 2.4

## 2020-03-09 ENCOUNTER — Encounter: Payer: Self-pay | Admitting: Physical Therapy

## 2020-03-09 ENCOUNTER — Other Ambulatory Visit: Payer: Self-pay

## 2020-03-09 ENCOUNTER — Ambulatory Visit: Payer: Medicare Other | Admitting: Physical Therapy

## 2020-03-09 DIAGNOSIS — R2681 Unsteadiness on feet: Secondary | ICD-10-CM | POA: Diagnosis not present

## 2020-03-09 DIAGNOSIS — R278 Other lack of coordination: Secondary | ICD-10-CM | POA: Diagnosis not present

## 2020-03-09 DIAGNOSIS — R2689 Other abnormalities of gait and mobility: Secondary | ICD-10-CM

## 2020-03-09 DIAGNOSIS — M6281 Muscle weakness (generalized): Secondary | ICD-10-CM

## 2020-03-09 DIAGNOSIS — M21371 Foot drop, right foot: Secondary | ICD-10-CM
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
## 2020-03-12 ENCOUNTER — Other Ambulatory Visit: Payer: Self-pay

## 2020-03-12 ENCOUNTER — Ambulatory Visit: Payer: Medicare Other | Admitting: Physical Therapy

## 2020-03-12 ENCOUNTER — Encounter: Payer: Self-pay | Admitting: Physical Therapy

## 2020-03-12 DIAGNOSIS — R2689 Other abnormalities of gait and mobility: Secondary | ICD-10-CM | POA: Diagnosis not present

## 2020-03-12 DIAGNOSIS — R278 Other lack of coordination: Secondary | ICD-10-CM | POA: Diagnosis not present

## 2020-03-12 DIAGNOSIS — M21371 Foot drop, right foot: Secondary | ICD-10-CM | POA: Diagnosis not present

## 2020-03-12 DIAGNOSIS — R2681 Unsteadiness on feet: Secondary | ICD-10-CM | POA: Diagnosis not present

## 2020-03-12 DIAGNOSIS — M6281 Muscle weakness (generalized): Secondary | ICD-10-CM | POA: Diagnosis not present
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
## 2020-03-12 NOTE — Patient Instructions (Signed)
Access Code: [email or web address] URL: [email or web address] Date: 03/12/2020 Prepared by: Misty Stanley  Exercises Sit to Stand with Arm Reach Toward Target - 1 x daily - 7 x weekly - 4 sets - 3-4 reps Standing Alternating Knee Flexion - 1 x daily - 7 x weekly - 5 sets - 5 reps Standing Hip Abduction with Counter Support - 1 x daily - 7 x weekly - 5 sets - 6 reps Romberg Stance on Foam Pad with Head Rotation - 1 x daily - 7 x weekly - 2 sets - 10 reps Romberg Stance with Head Nods on Foam Pad - 1 x daily - 7 x weekly - 2 sets - 10 reps Narrow Base of Support Standing Balance Eyes Closed on Foam Pad - 1 x daily - 7 x weekly - 2 sets - 10 hold Forward Step Over with Counter Support - 1 x daily - 7 x weekly - 2 sets - 5 reps

## 2020-03-29 ENCOUNTER — Other Ambulatory Visit: Payer: Self-pay

## 2020-03-29 ENCOUNTER — Encounter: Payer: Self-pay | Admitting: Physical Therapy

## 2020-03-29 ENCOUNTER — Ambulatory Visit: Payer: Medicare Other | Attending: Psychiatry | Admitting: Physical Therapy

## 2020-03-29 DIAGNOSIS — M6281 Muscle weakness (generalized): Secondary | ICD-10-CM | POA: Insufficient documentation

## 2020-03-29 DIAGNOSIS — M21371 Foot drop, right foot: Secondary | ICD-10-CM

## 2020-03-29 DIAGNOSIS — R2681 Unsteadiness on feet: Secondary | ICD-10-CM | POA: Diagnosis not present

## 2020-03-29 DIAGNOSIS — R2689 Other abnormalities of gait and mobility: Secondary | ICD-10-CM | POA: Insufficient documentation
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
## 2020-04-04 ENCOUNTER — Other Ambulatory Visit: Payer: Self-pay

## 2020-04-04 ENCOUNTER — Ambulatory Visit (HOSPITAL_COMMUNITY)
Admission: RE | Admit: 2020-04-04 | Discharge: 2020-04-04 | Disposition: A | Discharge status: Home / Self Care | Payer: Medicare Other | Source: Ambulatory Visit | Attending: Sports Medicine | Admitting: Sports Medicine

## 2020-04-04 DIAGNOSIS — M810 Age-related osteoporosis without current pathological fracture: Secondary | ICD-10-CM | POA: Insufficient documentation

## 2020-04-04 MED ORDER — ROMOSOZUMAB-AQQG 105 MG/1.17ML ~~LOC~~ SOSY
210.0000 mg | PREFILLED_SYRINGE | Freq: Once | SUBCUTANEOUS | Status: DC
  Administered 2020-04-04: 210 mg via SUBCUTANEOUS
  Filled 2020-04-04: qty 2.4

## 2020-04-05 ENCOUNTER — Ambulatory Visit: Payer: Medicare Other | Admitting: Physical Therapy

## 2020-04-12 ENCOUNTER — Other Ambulatory Visit: Payer: Self-pay

## 2020-04-12 ENCOUNTER — Ambulatory Visit: Payer: Medicare Other | Admitting: Physical Therapy

## 2020-04-12 ENCOUNTER — Encounter: Payer: Self-pay | Admitting: Physical Therapy

## 2020-04-12 DIAGNOSIS — R2681 Unsteadiness on feet: Secondary | ICD-10-CM | POA: Diagnosis not present

## 2020-04-12 DIAGNOSIS — R2689 Other abnormalities of gait and mobility: Secondary | ICD-10-CM | POA: Diagnosis not present

## 2020-04-12 DIAGNOSIS — M21371 Foot drop, right foot: Secondary | ICD-10-CM

## 2020-04-12 DIAGNOSIS — M6281 Muscle weakness (generalized): Secondary | ICD-10-CM | POA: Diagnosis not present
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
## 2020-04-12 NOTE — Patient Instructions (Addendum)
Access Code: [email or web address] URL: [email or web address] Date: 04/12/2020 Prepared by: Misty Stanley  Exercises Sit to Stand with Arm Reach Toward Target - 1 x daily - 7 x weekly - 4 sets - 3-4 reps Standing Alternating Knee Flexion - 1 x daily - 7 x weekly - 5 sets - 5 reps Romberg Stance on Foam Pad with Head Rotation - 1 x daily - 7 x weekly - 2 sets - 10 reps Romberg Stance with Head Nods on Foam Pad - 1 x daily - 7 x weekly - 2 sets - 10 reps Narrow Base of Support Standing Balance Eyes Closed on Foam Pad - 1 x daily - 7 x weekly - 2 sets - 10 hold Forward Step Over with Counter Support - 1 x daily - 7 x weekly - 2 sets - 10 reps Side Stepping with Resistance at Ankles and Counter Support - 1 x daily - 7 x weekly - 4 sets Hooklying Clamshell with Resistance - 1 x daily - 7 x weekly - 2 sets - 10 reps

## 2020-04-17 ENCOUNTER — Other Ambulatory Visit: Payer: Self-pay | Admitting: Internal Medicine

## 2020-04-17 DIAGNOSIS — F988 Other specified behavioral and emotional disorders with onset usually occurring in childhood and adolescence: Secondary | ICD-10-CM | POA: Diagnosis not present

## 2020-04-17 DIAGNOSIS — D849 Immunodeficiency, unspecified: Secondary | ICD-10-CM | POA: Diagnosis not present

## 2020-04-17 DIAGNOSIS — G35 Multiple sclerosis: Secondary | ICD-10-CM | POA: Diagnosis not present

## 2020-04-17 DIAGNOSIS — N319 Neuromuscular dysfunction of bladder, unspecified: Secondary | ICD-10-CM | POA: Diagnosis not present

## 2020-04-17 DIAGNOSIS — F32A Depression, unspecified: Secondary | ICD-10-CM | POA: Diagnosis not present

## 2020-04-17 DIAGNOSIS — G4700 Insomnia, unspecified: Secondary | ICD-10-CM | POA: Diagnosis not present

## 2020-04-18 ENCOUNTER — Other Ambulatory Visit: Payer: Self-pay | Admitting: Internal Medicine

## 2020-04-26 DIAGNOSIS — R3914 Feeling of incomplete bladder emptying: Secondary | ICD-10-CM | POA: Diagnosis not present

## 2020-04-26 DIAGNOSIS — R350 Frequency of micturition: Secondary | ICD-10-CM | POA: Diagnosis not present

## 2020-04-26 DIAGNOSIS — N3946 Mixed incontinence: Secondary | ICD-10-CM | POA: Diagnosis not present

## 2020-05-01 DIAGNOSIS — R5383 Other fatigue: Secondary | ICD-10-CM | POA: Diagnosis not present

## 2020-05-01 DIAGNOSIS — E559 Vitamin D deficiency, unspecified: Secondary | ICD-10-CM | POA: Diagnosis not present

## 2020-05-01 DIAGNOSIS — Z780 Asymptomatic menopausal state: Secondary | ICD-10-CM | POA: Diagnosis not present

## 2020-05-01 DIAGNOSIS — M8588 Other specified disorders of bone density and structure, other site: Secondary | ICD-10-CM | POA: Diagnosis not present

## 2020-05-01 DIAGNOSIS — M810 Age-related osteoporosis without current pathological fracture: Secondary | ICD-10-CM | POA: Diagnosis not present

## 2020-05-01 LAB — HM DEXA SCAN

## 2020-05-02 ENCOUNTER — Ambulatory Visit (HOSPITAL_COMMUNITY)
Admission: RE | Admit: 2020-05-02 | Discharge: 2020-05-02 | Disposition: A | Discharge status: Home / Self Care | Payer: Medicare Other | Source: Ambulatory Visit | Attending: Sports Medicine | Admitting: Sports Medicine

## 2020-05-02 ENCOUNTER — Other Ambulatory Visit: Payer: Self-pay

## 2020-05-02 DIAGNOSIS — M810 Age-related osteoporosis without current pathological fracture: Secondary | ICD-10-CM | POA: Insufficient documentation

## 2020-05-02 MED ORDER — ROMOSOZUMAB-AQQG 105 MG/1.17ML ~~LOC~~ SOSY
210.0000 mg | PREFILLED_SYRINGE | Freq: Once | SUBCUTANEOUS | Status: AC
  Administered 2020-05-02: 210 mg via SUBCUTANEOUS
  Filled 2020-05-02: qty 2.4

## 2020-05-03 ENCOUNTER — Other Ambulatory Visit: Payer: Self-pay | Admitting: Internal Medicine

## 2020-05-04 ENCOUNTER — Other Ambulatory Visit: Payer: Self-pay | Admitting: Internal Medicine

## 2020-05-09 ENCOUNTER — Encounter: Payer: Self-pay | Admitting: Family Medicine

## 2020-05-10 DIAGNOSIS — D72819 Decreased white blood cell count, unspecified: Secondary | ICD-10-CM | POA: Diagnosis not present

## 2020-05-10 DIAGNOSIS — G35 Multiple sclerosis: Secondary | ICD-10-CM | POA: Diagnosis not present

## 2020-05-25 ENCOUNTER — Other Ambulatory Visit: Payer: Self-pay

## 2020-05-25 ENCOUNTER — Ambulatory Visit (HOSPITAL_COMMUNITY)
Admission: RE | Admit: 2020-05-25 | Discharge: 2020-05-25 | Disposition: A | Discharge status: Home / Self Care | Payer: Medicare Other | Source: Ambulatory Visit | Attending: Sports Medicine | Admitting: Sports Medicine

## 2020-05-25 DIAGNOSIS — M810 Age-related osteoporosis without current pathological fracture: Secondary | ICD-10-CM | POA: Insufficient documentation

## 2020-05-25 MED ORDER — ROMOSOZUMAB-AQQG 105 MG/1.17ML ~~LOC~~ SOSY
210.0000 mg | PREFILLED_SYRINGE | Freq: Once | SUBCUTANEOUS | Status: AC
  Administered 2020-05-25: 210 mg via SUBCUTANEOUS
  Filled 2020-05-25: qty 2.4

## 2020-05-30 ENCOUNTER — Encounter (HOSPITAL_COMMUNITY): Payer: Medicare Other

## 2020-06-05 ENCOUNTER — Other Ambulatory Visit: Payer: Self-pay | Admitting: Internal Medicine

## 2020-06-05 DIAGNOSIS — R3914 Feeling of incomplete bladder emptying: Secondary | ICD-10-CM | POA: Diagnosis not present

## 2020-06-05 DIAGNOSIS — R350 Frequency of micturition: Secondary | ICD-10-CM | POA: Diagnosis not present

## 2020-06-05 DIAGNOSIS — N3946 Mixed incontinence: Secondary | ICD-10-CM | POA: Diagnosis not present

## 2020-06-06 NOTE — Telephone Encounter (Signed)
Called and spoke with patient. She stated that her cough returned 2 weeks ago. She is coughing the same amount during the day as at night. Productive cough with yellowish phlegm. Denied any increase in SOB, fevers or body aches. She also denied being around anyone who has been sick with in the last few weeks. She is UTD on her covid vaccines. I offered to send a message to the provider of the day since MW is not here or get her scheduled for an OV. She wanted an OV. She has been scheduled to see MW tomorrow at 915am.

## 2020-06-07 ENCOUNTER — Other Ambulatory Visit: Payer: Self-pay

## 2020-06-07 ENCOUNTER — Encounter: Payer: Self-pay | Admitting: Internal Medicine

## 2020-06-07 ENCOUNTER — Ambulatory Visit (INDEPENDENT_AMBULATORY_CARE_PROVIDER_SITE_OTHER): Payer: Medicare Other

## 2020-06-07 ENCOUNTER — Ambulatory Visit (INDEPENDENT_AMBULATORY_CARE_PROVIDER_SITE_OTHER): Payer: Medicare Other | Admitting: Internal Medicine

## 2020-06-07 DIAGNOSIS — J45991 Cough variant asthma: Secondary | ICD-10-CM

## 2020-06-07 DIAGNOSIS — J45909 Unspecified asthma, uncomplicated: Secondary | ICD-10-CM | POA: Diagnosis not present

## 2020-06-07 MED ORDER — FAMOTIDINE 20 MG PO TABS: ORAL_TABLET | ORAL | 11 refills | Status: DC

## 2020-06-07 NOTE — Patient Instructions (Signed)
Whenever cough or upper airway symptoms worsen, add pepcid 20 mg after supper until better for at least 2 weeks   For cough > delsym 2 tsp every 12 hours as needed    Return as needed

## 2020-06-07 NOTE — Progress Notes (Signed)
Subjective:    Patient ID: Kayla [name], female    DOB: 1950-06-10   MRN: [id number]   Brief patient profile:  72 yowf never smoker with MS since Oct 96 all neuro care through Dr [name] in [facility]),   [name] at [facility] urology,  [place] ortho  Followed here for Primary care/hypothyroid     History of Present Illness  October 04, 2009 Followup with PFT's. Pt states that her breathing is no better since last seen June 2011 for acute visit. She states she is still wheezing- ventolin helps and is using this up to tid. w/u c/w anemia  Mild dysphagia no more than usual. occ waking up wheezing at night despite zegerid at hs.   rec  Try  use dulera 100 2 bid prn   September 18 2015  R THR [name]    10/01/2015  f/u ov/[name] re: sob s/p et on dulera 100 2bid and dexilant q am  Chief Complaint  Patient presents with  . Acute Visit    Pt c/o increased DOE since had hip replacement 09/18/15. She states she gets SOB with just talking, showering or walking short distances.   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 Follow-up    Doing well and denies any co's today.   back in CPAP as of July 2017  > no change in symptoms but wakes her up a lot / f/u per Dr [name] planned  rec See Dr [name] for all issues related to your cpap machine but it appears to be working fine by your download today  No change in medications and recs     08/08/2016  f/u ov/[name] re: multiple concerns 1) check thyroid - clinically no symptoms 2) feet stay cold/ blue with neg vascular w/u to date 3) wants referral to sports medicine as needs stay hyperextended due to  MS/ no problem with falling just using cane Rec Please see patient coordinator before you leave today  to schedule referal to sports medicine and vascular surgery Please remember to go to the lab department downstairs in the basement  for your tests - we will call you with the results when they are available. Please schedule a follow up visit in 3 months but call sooner if needed     11/10/2016  f/u ov/[name] re: hyponatremia  Chief Complaint  Patient presents with  . Follow-up    recent visit to ED- 11/07/16 for hyponatremia- sodium level was 123.   Bladder dysfunction / foley dep since 11/04/16 > f/u [place]odt Knee hyperext better with support  Hyponatremia/ no symptoms on  Lasix daily  cpap d/c 'd 09/03/2016 with RLS not bothersome to her > neurology f/u  Asthma vs vcd    dulera 100 2bid / hfa with some sense of globus but no cough or increased sob/ noct symptoms or need for saba  rec Plan A = Automatic = dulera 100 2 pffs each am and then only take  the pm the dose if you are not doing great and/or needing your rescue inhaler  Work on inhaler technique:      Plan B = Backup Only use your albuterol (ventolin) as a rescue medication   Drink gatorade if you need to push more fluids for any reason       12/20/2019  f/u ov/[name] re:  Vcd related to [place] / AB/ hypothyroid/ Tendency to mild hyponatremia  Chief Complaint  Patient presents with  . Follow-up    Patient has been doing good since last visit, no concerns   Dyspnea:  50 ft / no longer doing much shopping other than [employer]  Cough: none Sleeping: no resp issues / one pillow  SABA use: none while dulera 100 2bid  02: none  rec No change rx   06/07/2020  Acute  ov/[name] re: new cough x 2 weeks / rx bactrim started 2 d prior to OV  due to uti per urology  Chief Complaint  Patient presents with  . Acute Visit    Cough x 2 wks- worse evening and night- prod with yellow sputum.   Dyspnea:  No change Cough: worse p supper but  does not keep her up noct  /ok in am x nose stopped up too   Sleeping: bed blocks/ 1 pillow SABA use: has not used  02: none  Covid status:   vax x 3    No obvious day to day or daytime variability or assoc excess/ purulent sputum or mucus plugs or hemoptysis or cp or chest tightness, subjective wheeze or overt  hb symptoms.   Sleeping as above  without nocturnal  or early am exacerbation  of respiratory  c/o's or need for noct saba. Also denies any obvious fluctuation of symptoms with weather or environmental changes or other aggravating or alleviating factors except as outlined above   No unusual exposure hx or h/o childhood pna/ asthma or knowledge of premature birth.  Current Allergies, Complete Past Medical History, Past Surgical History, Family History, and Social History were reviewed in [employer] record.  ROS  The following are not active complaints unless bolded Hoarseness, sore throat, dysphagia, dental problems, itching, sneezing,  nasal congestion or discharge of excess mucus or purulent secretions, ear ache,   fever, chills, sweats, unintended wt loss or wt gain, classically pleuritic or exertional cp,  orthopnea pnd or arm/hand swelling  or leg swelling, presyncope, palpitations, abdominal pain, anorexia, nausea, vomiting, diarrhea  or change in bowel habits or change in bladder habits, change in stools or change in urine, dysuria, hematuria,  rash, arthralgias, visual complaints, headache, numbness, weakness or ataxia or problems with walking or coordination,  change in mood or  memory.        Current Meds  Medication Sig  . amphetamine-dextroamphetamine (ADDERALL) 20 MG tablet Take 20 mg by mouth 2 (two) times daily.   . baclofen (LIORESAL) 20 MG tablet Take 20 mg by mouth 3 (three) times daily.   . Calcium Carbonate-Vitamin D 600-400 MG-UNIT tablet Take 1 tablet by mouth daily.  Marland Kitchen dalfampridine 10 MG TB12   . dexlansoprazole (DEXILANT) 60 MG capsule  TAKE 1 CAPSULE BY MOUTH  ONCE DAILY WITH BREAKFAST  . FLUoxetine (PROZAC) 40 MG capsule Take 40 mg by mouth daily.  . furosemide (LASIX) 20 MG tablet TAKE 1 TABLET BY MOUTH  DAILY  . gabapentin (NEURONTIN) 600 MG tablet Take 600 mg by mouth 2 (two) times daily.  Marland Kitchen  levothyroxine (SYNTHROID) 100 MCG tablet TAKE 1 TABLET BY MOUTH ONCE DAILY BEFORE BREAKFAST  . mometasone-formoterol (DULERA) 100-5 MCG/ACT AERO USE 2 PUFFS EVERY 12 HOURS.  . Multiple Vitamin (MULTIVITAMIN WITH MINERALS) TABS tablet Take 1 tablet by mouth daily.  Marland Kitchen MYRBETRIQ 25 MG TB24 tablet Take 1 tablet by mouth daily.  Marland Kitchen ocrelizumab (OCREVUS) 300 MG/10ML injection Inject 300 mg into the vein every 6 (six) months.   . Respiratory Therapy Supplies (FLUTTER) DEVI Use as directed  . temazepam (RESTORIL) 15 MG capsule Take 15 mg by mouth at bedtime.  . VENTOLIN HFA 108 (90 Base) MCG/ACT inhaler INHALE 2 PUFFS INTO LUNGS EVERY 6 HOURS AS NEEDED.                        Past Medical History:  MULTIPLE SCLEROSIS (ICD-340)........................Marland Kitchen Dr Effie Shy [facility], was at [facility])  - dx 12/1994  Sleep Apnea.....................................................Marland Kitchen        Dr Melida Quitter       - CPAP rx 04/2010 HYPOTHYROIDISM (ICD-244.9)  OTHER DISEASES OF VOCAL CORDS (ICD-478.5)  Cervical Cancer > complete hysterectomy...........Marland Kitchen [name] HOARSENESS (ICD-784.49).................................  [place]  Bladder dysfunction ...............................................Marland Kitchen  [place]odt  Dyspnea/wheeze ? asthma  - HFA 50 % after coaching October 04, 2009 >  50% 06/25/2010  - PFT's October 04, 2009 FEV1 2.15 (95%) with ratio 69 and DLCO 58> corrects to 98, mild insp truncation  Health Maintenance.............................................Marland KitchenWert  - Pneumovax March 28, 2009 and Prevnar 13 11/06/2015 and pneumovax 04/26/2019  - Td  04/26/2019  - CPX 11/06/2015        Objective:   Physical Exam      06/07/2020    126 12/20/2019  122 04/26/2019    126  08/26/2018    122 Wt  118 > 127 March 28, 2009 >129 June 17 > 128 October 04, 2009 > 129 06/25/2010 >125 10/29/2010 > 128 11/26/2010 >>127 12/10/2010 > 11/17/2011  129> 126 05/20/2012 > 07/29/2012  129 > 10/20/2012  129 >  135 06/21/2013 >  11/04/2013 135 >134 04/04/2014 > 04/18/2014 135 >  07/17/2014 132> 08/17/2014  135 > 12/20/2014 > 01/17/2015 134   > 08/08/2015   126 >  10/01/2015   130 > 11/06/2015   130 > 11/10/2016  123 > 02/10/2017  133  > 05/14/2017   129 > 08/11/2017  125 > 11/24/2017  127 >  02/23/2018  127 >  03/29/2018   128    Vital signs reviewed  06/07/2020  - Note at rest 02 sats  95% on RA   General appearance:    amb hoarse wf mild stridor (baseline)     HEENT : pt wearing mask not removed for exam due to covid -19 concerns.    NECK :  without JVD/Nodes/TM/ nl carotid upstrokes bilaterally   LUNGS: no acc muscle use,  Nl contour chest with transmitted noises from the upper airway  without cough on insp or exp maneuvers   CV:  RRR  no s3 or murmur or increase in P2, and no edema   ABD:  soft and nontender with nl inspiratory excursion in the supine position. No bruits or organomegaly appreciated, bowel sounds nl  MS:  Nl gait/ ext warm without deformities, calf tenderness, cyanosis or clubbing No obvious joint restrictions   SKIN: warm and dry without lesions    NEURO:  alert, approp, nl sensorium with  no motor or cerebellar deficits apparent.     CXR PA and  Lateral:   06/07/2020 :    I personally reviewed images and agree with radiology impression as follows:    Diffuse peribronchial cuffing, which could indicate reactive airway disease or bronchitis.                           Assessment & Plan:

## 2020-06-08 ENCOUNTER — Encounter: Payer: Self-pay | Admitting: *Deleted

## 2020-06-09 ENCOUNTER — Encounter: Payer: Self-pay | Admitting: Internal Medicine

## 2020-06-09 NOTE — Assessment & Plan Note (Addendum)
Onset 2011  With pfts 09/2009 With minimal airflow obst and insp truncation c/w vcd  - 11/10/2016 rec try just taking 2 pffs each am since having sense of globus on 2bid with baseline poor technique - flutter valve rec 05/14/2017  - 08/26/2018  After extensive coaching inhaler device,  effectiveness =    75% (short Ti)   Mild flare with uri already on abx for uti so rx:  With pepcid p supper and delsym but hold off on abx or change in maint rx as most of this is just an upper airway flare.          Each maintenance medication was reviewed in detail including emphasizing most importantly the difference between maintenance and prns and under what circumstances the prns are to be triggered using an action plan format where appropriate.  Total time for H and P, chart review, counseling, reviewing hfadevice(s) and generating customized AVS unique to this acute office visit / same day charting = 26 min

## 2020-06-13 DIAGNOSIS — R3914 Feeling of incomplete bladder emptying: Secondary | ICD-10-CM | POA: Diagnosis not present

## 2020-06-13 DIAGNOSIS — N130 Hydronephrosis with ureteropelvic junction obstruction: Secondary | ICD-10-CM | POA: Diagnosis not present

## 2020-06-13 DIAGNOSIS — N3946 Mixed incontinence: Secondary | ICD-10-CM | POA: Diagnosis not present

## 2020-06-28 DIAGNOSIS — M810 Age-related osteoporosis without current pathological fracture: Secondary | ICD-10-CM | POA: Diagnosis not present

## 2020-06-28 DIAGNOSIS — E559 Vitamin D deficiency, unspecified: Secondary | ICD-10-CM | POA: Diagnosis not present

## 2020-06-28 DIAGNOSIS — R5383 Other fatigue: Secondary | ICD-10-CM | POA: Diagnosis not present

## 2020-07-03 ENCOUNTER — Ambulatory Visit: Payer: Medicare Other | Attending: Internal Medicine

## 2020-07-03 DIAGNOSIS — Z20822 Contact with and (suspected) exposure to covid-19: Secondary | ICD-10-CM

## 2020-07-04 LAB — NOVEL CORONAVIRUS, NAA: SARS-CoV-2, NAA: NOT DETECTED

## 2020-07-04 LAB — SARS-COV-2, NAA 2 DAY TAT

## 2020-07-09 ENCOUNTER — Telehealth: Payer: Self-pay | Admitting: Internal Medicine

## 2020-07-09 DIAGNOSIS — Z96641 Presence of right artificial hip joint: Secondary | ICD-10-CM | POA: Diagnosis not present

## 2020-07-09 DIAGNOSIS — N130 Hydronephrosis with ureteropelvic junction obstruction: Secondary | ICD-10-CM | POA: Diagnosis not present

## 2020-07-09 DIAGNOSIS — N1330 Unspecified hydronephrosis: Secondary | ICD-10-CM | POA: Diagnosis not present

## 2020-07-09 DIAGNOSIS — Z90710 Acquired absence of both cervix and uterus: Secondary | ICD-10-CM | POA: Diagnosis not present

## 2020-07-09 DIAGNOSIS — N3289 Other specified disorders of bladder: Secondary | ICD-10-CM | POA: Diagnosis not present

## 2020-07-09 MED ORDER — PREDNISONE 10 MG PO TABS: ORAL_TABLET | ORAL | 0 refills | Status: AC

## 2020-07-09 MED ORDER — AZITHROMYCIN 250 MG PO TABS: ORAL_TABLET | ORAL | 0 refills | Status: DC

## 2020-07-09 NOTE — Telephone Encounter (Signed)
Pt states that she is not feeling any better and stated that she has been coughing up phlegm (yellow in color), ran a low grade fever, pt stated that she did a home test and PCR test for Covid and they both came back negative. Pt stated that it is hard to breathe and denies having chest pain and wheezing. Pls regard [phone]. Pharmacy; [facility] [place]

## 2020-07-09 NOTE — Telephone Encounter (Signed)
Called and spoke with pt letting her know the recs stated by MW and she verbalized understanding. Verified  Preferred pharmacy and sent Rx to pharmacy. Nothing further needed.

## 2020-07-09 NOTE — Telephone Encounter (Signed)
Primary Pulmonologist: Dr Melvyn Novas Last office visit and with whom: 06/07/2020 Dr Melvyn Novas What do we see them for (pulmonary problems): Cough variant asthma vs vcd related to MS Last OV assessment/plan: Onset 2011  With pfts 09/2009 With minimal airflow obst and insp truncation c/w vcd  - 11/10/2016 rec try just taking 2 pffs each am since having sense of globus on 2bid with baseline poor technique - flutter valve rec 05/14/2017  - 08/26/2018  After extensive coaching inhaler device,  effectiveness =    75% (short Ti)   Mild flare with uri already on abx for uti so rx:  With pepcid p supper and delsym but hold off on abx or change in maint rx as most of this is just an upper airway flare.          Each maintenance medication was reviewed in detail including emphasizing most importantly the difference between maintenance and prns and under what circumstances the prns are to be triggered using an action plan format where appropriate.  Reason for call: Pt states that she is not feeling any better and stated that she has been coughing up phlegm (yellow in color) since last week, ran a low grade fever last week but no fever this week, pt stated that she did a home test and PCR test for Covid and they both came back negative. Pt stated that it is hard to breathe due to coughing and denies having chest pain and wheezing. Has been taking medications as prescribed including pepcid and dulera.  Pharmacy; [facility] [place], [place] - [address]   Dr Melvyn Novas please advise.    Allergies  Allergen Reactions  . Tramadol     hallucinations   . Contrast Media [Iodinated Diagnostic Agents] Rash  . Iohexol Hives  . Methylprednisolone Other (See Comments)    Reaction:  Decreases pts heart rate   . Morphine And Related Other (See Comments)    Reaction:  Hallucinations   . Oxycodone-Acetaminophen Rash and Other (See Comments)    Reaction:  Hallucinations   . Phenytoin Sodium Extended Rash   . Zanaflex [Tizanidine Hydrochloride] Rash    Immunization History  Administered Date(s) Administered  . DTP 04/10/2009  . Fluad Quad(high Dose 65+) 12/29/2018  . Influenza Split 12/10/2010, 02/11/2012, 12/22/2012, 01/02/2014, 12/22/2014  . Influenza Whole 02/19/2005, 03/28/2009, 01/23/2016  . Influenza, High Dose Seasonal PF 02/23/2018  . Influenza,inj,Quad PF,6+ Mos 02/10/2017  . Influenza-Unspecified 12/02/2018, 12/31/2018, 01/08/2019, 12/22/2019  . Moderna Sars-Covid-2 Vaccination 04/29/2019, 06/01/2019  . Pneumococcal Conjugate-13 11/06/2015  . Pneumococcal Polysaccharide-23 03/28/2009, 04/26/2019  . Tdap 04/26/2019  . Zoster Recombinat (Shingrix) 09/07/2019, 02/17/2020

## 2020-07-09 NOTE — Telephone Encounter (Signed)
Prednisone 10 mg take  4 each am x 2 days,   2 each am x 2 days,  1 each am x 2 days and stop zpak 

## 2020-07-13 DIAGNOSIS — N130 Hydronephrosis with ureteropelvic junction obstruction: Secondary | ICD-10-CM | POA: Diagnosis not present

## 2020-07-13 DIAGNOSIS — N3946 Mixed incontinence: Secondary | ICD-10-CM | POA: Diagnosis not present

## 2020-07-13 DIAGNOSIS — R3914 Feeling of incomplete bladder emptying: Secondary | ICD-10-CM | POA: Diagnosis not present

## 2020-07-17 DIAGNOSIS — N312 Flaccid neuropathic bladder, not elsewhere classified: Secondary | ICD-10-CM | POA: Diagnosis not present

## 2020-07-17 DIAGNOSIS — R3914 Feeling of incomplete bladder emptying: Secondary | ICD-10-CM | POA: Diagnosis not present

## 2020-07-17 DIAGNOSIS — N3946 Mixed incontinence: Secondary | ICD-10-CM | POA: Diagnosis not present

## 2020-07-18 ENCOUNTER — Other Ambulatory Visit: Payer: Self-pay

## 2020-07-18 ENCOUNTER — Encounter: Payer: Self-pay | Admitting: Internal Medicine

## 2020-07-18 ENCOUNTER — Ambulatory Visit (INDEPENDENT_AMBULATORY_CARE_PROVIDER_SITE_OTHER): Payer: Medicare Other

## 2020-07-18 ENCOUNTER — Ambulatory Visit (INDEPENDENT_AMBULATORY_CARE_PROVIDER_SITE_OTHER): Payer: Medicare Other | Admitting: Internal Medicine

## 2020-07-18 DIAGNOSIS — J45991 Cough variant asthma: Secondary | ICD-10-CM

## 2020-07-18 DIAGNOSIS — R059 Cough, unspecified: Secondary | ICD-10-CM | POA: Diagnosis not present

## 2020-07-18 MED ORDER — GABAPENTIN 600 MG PO TABS: 600.0000 mg | ORAL_TABLET | Freq: Three times a day (TID) | ORAL | 2 refills | Status: DC

## 2020-07-18 MED ORDER — PREDNISONE 10 MG PO TABS: ORAL_TABLET | ORAL | 0 refills | Status: DC

## 2020-07-18 MED ORDER — BENZONATATE 200 MG PO CAPS: 200.0000 mg | ORAL_CAPSULE | Freq: Three times a day (TID) | ORAL | 1 refills | Status: DC | PRN

## 2020-07-18 NOTE — Patient Instructions (Addendum)
Prednisone 10 mg take  4 each am x 2 days,   2 each am x 2 days,  1 each am x 2 days and stop   Increase gabapentin to 600 mg three times daily   For cough try tessalon 200 mg every 6 hours as needed   Please remember to go to th  x-ray department  for your tests - we will call you with the results when they are available.       Follow up is as needed

## 2020-07-18 NOTE — Progress Notes (Addendum)
Subjective:    Patient ID: Kayla [name], female    DOB: 1950-06-10   MRN: [id number]   Brief patient profile:  72 yowf never smoker with MS since Oct 96 all neuro care through Dr [name] in [facility] ([facility]),   [name] at [facility] urology,  [place] [facility]  Followed here for Primary care/hypothyroid     History of Present Illness  October 04, 2009 Followup with PFT's. Pt states that her breathing is no better since last seen June 2011 for acute visit. She states she is still wheezing- ventolin helps and is using this up to tid. w/u c/w anemia  Mild dysphagia no more than usual. occ waking up wheezing at night despite zegerid at hs.   rec  Try  use dulera 100 2 bid prn   September 18 2015  R THR [name]    10/01/2015  f/u ov/Eulalah Rupert re: sob s/p et on dulera 100 2bid and dexilant q am  Chief Complaint  Patient presents with  . Acute Visit    Pt c/o increased DOE since had hip replacement 09/18/15. She states she gets SOB with just talking, showering or walking short distances.   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 Follow-up    Doing well and denies any co's today.   back in CPAP as of July 2017  > no change in symptoms but wakes her up a lot / f/u per Dr [name] planned  rec See Dr [name] for all issues related to your cpap machine but it appears to be working fine by your download today  No change in medications and recs     08/08/2016  f/u ov/Yaileen Hofferber re: multiple concerns 1) check thyroid - clinically no symptoms 2) feet stay cold/ blue with neg vascular w/u to date 3) wants referral to sports medicine as needs stay hyperextended due to  MS/ no problem with falling just using cane Rec Please see patient coordinator before you leave today  to schedule referal to sports medicine and vascular surgery Please remember to go to the lab department downstairs in the basement  for your tests - we will call you with the results when they are available. Please schedule a follow up visit in 3 months but call sooner if needed     11/10/2016  f/u ov/Divon Krabill re: hyponatremia  Chief Complaint  Patient presents with  . Follow-up    recent visit to ED- 11/07/16 for hyponatremia- sodium level was 123.   Bladder dysfunction / foley dep since 11/04/16 > f/u [place]odt Knee hyperext better with support  Hyponatremia/ no symptoms on  Lasix daily  cpap d/c 'd 09/03/2016 with RLS not bothersome to her > neurology f/u  Asthma vs vcd    dulera 100 2bid / hfa with some sense of globus but no cough or increased sob/ noct symptoms or need for saba  rec Plan A = Automatic = dulera 100 2 pffs each am and then only take  the pm the dose if you are not doing great and/or needing your rescue inhaler  Work on inhaler technique:      Plan B = Backup Only use your albuterol (ventolin) as a rescue medication   Drink gatorade if you need to push more fluids for any reason       12/20/2019  f/u ov/Gisell Buehrle re:  Vcd related to [place] / AB/ hypothyroid/ Tendency to mild hyponatremia  Chief Complaint  Patient presents with  . Follow-up    Patient has been doing good since last visit, no concerns   Dyspnea:  50 ft / no longer doing much shopping other than [employer]  Cough: none Sleeping: no resp issues / one pillow  SABA use: none while dulera 100 2bid  02: none  rec No change rx   06/07/2020  Acute  ov/Molli Gethers re: new cough x 2 weeks / rx bactrim started 2 d prior to OV  due to uti per urology  Chief Complaint  Patient presents with  . Acute Visit    Cough x 2 wks- worse evening and night- prod with yellow sputum.   Dyspnea:  No change Cough: worse p supper but  does not keep her up noct  /ok in am x nose stopped up too   Sleeping: bed blocks/ 1 pillow SABA use: has not used  02: none  Covid status:   vax x 3  rec Whenever cough or upper airway symptoms worsen, add pepcid 20 mg after supper until better for at least 2 weeks  For cough > delsym 2 tsp every 12 hours as needed     07/09/20 PC ? Asthma flare : Prednisone 10 mg take  4 each am x 2 days,   2 each am x 2 days,  1 each am x 2 days and stop  zpak > helps some    07/18/2020  f/u ov/Karrissa Parchment re: cough with prominent UACS from VC paralysis from [place] Complaint  Patient presents with  . Follow-up    Cough is some better, worse at night.Productive white  Dyspnea:  Rolling walker ok  Cough: worse at hs / mucoid / bed blocks  SABA use: has it, not using  02: none  Covid status:   moderna x 2 / never infected    No obvious day to day or daytime variability or assoc excess/ purulent sputum or mucus plugs or hemoptysis or cp or chest tightness, subjective wheeze or overt sinus or hb symptoms.    Also denies any obvious fluctuation of symptoms with weather or environmental changes or other aggravating or alleviating factors except as outlined above   No unusual exposure hx or h/o childhood pna/ asthma or knowledge of premature birth.  Current Allergies, Complete Past Medical History, Past Surgical History, Family History, and Social History were reviewed in [employer] record.  ROS  The following are not active complaints unless bolded Hoarseness, sore throat, dysphagia, dental problems, itching, sneezing,  nasal congestion or discharge of excess mucus or purulent secretions, ear ache,   fever, chills, sweats, unintended wt loss or wt gain, classically pleuritic or exertional cp,  orthopnea pnd or arm/hand swelling  or leg swelling, presyncope, palpitations, abdominal pain, anorexia, nausea, vomiting, diarrhea  or change in bowel habits or change in bladder habits, change  in stools or change in urine, dysuria, hematuria,  rash, arthralgias, visual complaints, headache, numbness, weakness or ataxia or problems with walking or coordination,  change in mood  or  memory.        Current Meds  Medication Sig  . amphetamine-dextroamphetamine (ADDERALL) 20 MG tablet Take 20 mg by mouth 2 (two) times daily.   . baclofen (LIORESAL) 20 MG tablet Take 20 mg by mouth 3 (three) times daily.   . Calcium Carbonate-Vitamin D 600-400 MG-UNIT tablet Take 1 tablet by mouth daily.  Marland Kitchen dalfampridine 10 MG TB12   . dexlansoprazole (DEXILANT) 60 MG capsule TAKE 1 CAPSULE BY MOUTH  ONCE DAILY WITH BREAKFAST  . famotidine (PEPCID) 20 MG tablet One after supper  . FLUoxetine (PROZAC) 40 MG capsule Take 40 mg by mouth daily.  . furosemide (LASIX) 20 MG tablet TAKE 1 TABLET BY MOUTH  DAILY  . gabapentin (NEURONTIN) 600 MG tablet Take 600 mg by mouth 2 (two) times daily.  Marland Kitchen levothyroxine (SYNTHROID) 100 MCG tablet TAKE 1 TABLET BY MOUTH ONCE DAILY BEFORE BREAKFAST  . mometasone-formoterol (DULERA) 100-5 MCG/ACT AERO USE 2 PUFFS EVERY 12 HOURS.  . Multiple Vitamin (MULTIVITAMIN WITH MINERALS) TABS tablet Take 1 tablet by mouth daily.  Marland Kitchen MYRBETRIQ 25 MG TB24 tablet Take 1 tablet by mouth daily.  Marland Kitchen ocrelizumab (OCREVUS) 300 MG/10ML injection Inject 300 mg into the vein every 6 (six) months.   . Respiratory Therapy Supplies (FLUTTER) DEVI Use as directed  . tamsulosin (FLOMAX) 0.4 MG CAPS capsule Take 0.4 mg by mouth.  . temazepam (RESTORIL) 15 MG capsule Take 15 mg by mouth at bedtime.  . VENTOLIN HFA 108 (90 Base) MCG/ACT inhaler INHALE 2 PUFFS INTO LUNGS EVERY 6 HOURS AS NEEDED.                         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  [place]  Bladder dysfunction ...............................................Marland Kitchen  Mc[place]odt  Dyspnea/wheeze ? asthma  - HFA 50 % after coaching October 04, 2009 >  50% 06/25/2010  - PFT's October 04, 2009 FEV1 2.15 (95%) with ratio 69 and DLCO 58> corrects to 98, mild insp truncation  Health Maintenance.............................................Marland KitchenWert  - Pneumovax March 28, 2009 and Prevnar 13 11/06/2015 and pneumovax 04/26/2019  - Td  04/26/2019  - CPX 11/06/2015        Objective:   Physical Exam     07/18/2020   120 06/07/2020   126 12/20/2019  122 04/26/2019    126  08/26/2018    122 Wt  118 > 127 March 28, 2009 >129 June 17 > 128 October 04, 2009 > 129 06/25/2010 >125 10/29/2010 > 128 11/26/2010 >>127 12/10/2010 > 11/17/2011  129> 126 05/20/2012 > 07/29/2012  129 > 10/20/2012  129 >  135 06/21/2013 >  11/04/2013 135 >134 04/04/2014 > 04/18/2014 135 >  07/17/2014 132> 08/17/2014  135 > 12/20/2014 > 01/17/2015 134   > 08/08/2015   126 >  10/01/2015   130 > 11/06/2015   130 > 11/10/2016  123 > 02/10/2017  133  > 05/14/2017   129 > 08/11/2017  125 > 11/24/2017  127 >  02/23/2018  127 >  03/29/2018   128    Vital signs reviewed  07/18/2020  - Note at rest 02 sats  97% on RA  General appearance:    amb hoarse wf with freq throat clearing and classic upper airway wheeze   HEENT : pt wearing mask not removed for exam due to covid -19 concerns.    NECK :  without JVD/Nodes/TM/ nl carotid upstrokes bilaterally   LUNGS: no acc muscle use,  Nl contour chest which is clear to A and P bilaterally without cough on insp or exp maneuvers   CV:  RRR  no s3 or murmur or increase in P2, and no edema   ABD:  soft and nontender with nl inspiratory excursion in the supine position. No bruits or organomegaly appreciated, bowel sounds nl  MS:   ext warm without deformities,  calf tenderness, cyanosis or clubbing No obvious joint restrictions   SKIN: warm and dry with venous stasis changes both LE's   NEURO:  alert, approp, nl sensorium with  no motor or cerebellar deficits apparent.          CXR PA and Lateral:   07/18/2020 :    I personally reviewed images/ impression as follows:   No acute changes                      Assessment & Plan:

## 2020-07-19 ENCOUNTER — Encounter: Payer: Self-pay | Admitting: Internal Medicine

## 2020-07-19 ENCOUNTER — Encounter: Payer: Self-pay | Admitting: *Deleted

## 2020-07-19 NOTE — Assessment & Plan Note (Addendum)
Onset 2011  With pfts 09/2009 With minimal airflow obst and insp truncation c/w vcd  - 11/10/2016 rec try just taking 2 pffs each am since having sense of globus on 2bid with baseline poor technique - flutter valve rec 05/14/2017  - 08/26/2018  After extensive coaching inhaler device,  effectiveness =    75% (short Ti)   This flare appears to be completely upper airway in nature, though always hard to be sure.  Rec: Prednisone 10 mg take  4 each am x 2 days,   2 each am x 2 days,  1 each am x 2 days and stop  Try increase gabapentin to 600 mg three times Try tessalon 200 mg tid prn to avoid narcotic cough meds   F/u can be prn flare with f/u ent if present symptoms persist         Each maintenance medication was reviewed in detail including emphasizing most importantly the difference between maintenance and prns and under what circumstances the prns are to be triggered using an action plan format where appropriate.  Total time for H and P, chart review, counseling,   and generating customized AVS unique to this office visit / same day charting = 24 min

## 2020-07-19 NOTE — Progress Notes (Signed)
Letter mailed to the pt. 

## 2020-07-25 ENCOUNTER — Other Ambulatory Visit: Payer: Self-pay

## 2020-07-25 DIAGNOSIS — G35 Multiple sclerosis: Secondary | ICD-10-CM | POA: Diagnosis not present

## 2020-07-25 DIAGNOSIS — F32A Depression, unspecified: Secondary | ICD-10-CM | POA: Diagnosis not present

## 2020-07-25 DIAGNOSIS — F988 Other specified behavioral and emotional disorders with onset usually occurring in childhood and adolescence: Secondary | ICD-10-CM | POA: Diagnosis not present

## 2020-07-25 DIAGNOSIS — D849 Immunodeficiency, unspecified: Secondary | ICD-10-CM | POA: Diagnosis not present

## 2020-07-25 DIAGNOSIS — N319 Neuromuscular dysfunction of bladder, unspecified: Secondary | ICD-10-CM | POA: Diagnosis not present

## 2020-07-25 DIAGNOSIS — D72819 Decreased white blood cell count, unspecified: Secondary | ICD-10-CM | POA: Diagnosis not present

## 2020-07-26 ENCOUNTER — Ambulatory Visit (INDEPENDENT_AMBULATORY_CARE_PROVIDER_SITE_OTHER): Payer: Medicare Other | Admitting: Family Medicine

## 2020-07-26 ENCOUNTER — Encounter: Payer: Self-pay | Admitting: Family Medicine

## 2020-07-26 VITALS — BP 126/80 | HR 83 | Temp 98.6°F | Ht 63.0 in | Wt 120.8 lb

## 2020-07-26 DIAGNOSIS — E038 Other specified hypothyroidism: Secondary | ICD-10-CM | POA: Diagnosis not present

## 2020-07-26 DIAGNOSIS — M810 Age-related osteoporosis without current pathological fracture: Secondary | ICD-10-CM | POA: Diagnosis not present

## 2020-07-26 DIAGNOSIS — G4761 Periodic limb movement disorder: Secondary | ICD-10-CM

## 2020-07-26 DIAGNOSIS — E785 Hyperlipidemia, unspecified: Secondary | ICD-10-CM

## 2020-07-26 DIAGNOSIS — I75023 Atheroembolism of bilateral lower extremities: Secondary | ICD-10-CM

## 2020-07-26 DIAGNOSIS — G35 Multiple sclerosis: Secondary | ICD-10-CM

## 2020-07-26 DIAGNOSIS — E871 Hypo-osmolality and hyponatremia: Secondary | ICD-10-CM

## 2020-07-26 DIAGNOSIS — R339 Retention of urine, unspecified: Secondary | ICD-10-CM

## 2020-07-26 LAB — LIPID PANEL
Cholesterol: 262 mg/dL — ABNORMAL HIGH (ref 0–200)
HDL: 129.9 mg/dL (ref >39.00)
LDL Cholesterol: 121 mg/dL — ABNORMAL HIGH (ref 0–99)
NonHDL: 132.21
Total CHOL/HDL Ratio: 2
Triglycerides: 55 mg/dL (ref 0.0–149.0)
VLDL: 11 mg/dL (ref 0.0–40.0)

## 2020-07-26 LAB — CBC
HCT: 35.7 % — ABNORMAL LOW (ref 36.0–46.0)
Hemoglobin: 11.9 g/dL — ABNORMAL LOW (ref 12.0–15.0)
MCHC: 33.5 g/dL (ref 30.0–36.0)
MCV: 87.2 fl (ref 78.0–100.0)
Platelets: 503 10*3/uL — ABNORMAL HIGH (ref 150.0–400.0)
RBC: 4.09 Mil/uL (ref 3.87–5.11)
RDW: 14.8 % (ref 11.5–15.5)
WBC: 9.4 10*3/uL (ref 4.0–10.5)

## 2020-07-26 LAB — BASIC METABOLIC PANEL
BUN: 18 mg/dL (ref 6–23)
CO2: 30 mEq/L (ref 19–32)
Calcium: 10.3 mg/dL (ref 8.4–10.5)
Chloride: 96 mEq/L (ref 96–112)
Creatinine, Ser: 0.84 mg/dL (ref 0.40–1.20)
GFR: 71.61 mL/min (ref >60.00)
Glucose, Bld: 97 mg/dL (ref 70–99)
Potassium: 3.8 mEq/L (ref 3.5–5.1)
Sodium: 137 mEq/L (ref 135–145)

## 2020-07-26 LAB — T4, FREE: Free T4: 1.52 ng/dL (ref 0.60–1.60)

## 2020-07-26 LAB — ALT: ALT: 15 U/L (ref 0–35)

## 2020-07-26 LAB — AST: AST: 17 U/L (ref 0–37)

## 2020-07-26 LAB — TSH: TSH: 0.47 u[IU]/mL (ref 0.35–4.50)

## 2020-07-26 NOTE — Progress Notes (Signed)
Kayla [name] is a 70 y.o. female  Chief Complaint  Patient presents with  . Follow-up    Pt here for 6 month f/u and lab work.  Pt c/o having a cough x 11months, coughing up mucus, yellow and sometimes white.     HPI: Kayla [name] is a 70 y.o. female patient seen today for routine f/u on chronic medical issues including hypothyroidism, HLD, osteoporosis, MS, chronic cough (cough variant asthma vs MS-related). Pt is due for labs and is fasting today.  For hypothyroidism pt is taking levothyroxine 176mcg daily. Last dexa in 04/2020 - T-score = -3.0. She is going to start prolia injection. She follows with neuro Dr. Delphia Grates, pulm Dr. Melvyn Novas, urology Dr. Matilde Sprang. She is now doing self-cath 2x/day. She is taking flomax and myrbetriq.  Past Medical History:  Diagnosis Date  . Arthritis    oa  . Cervical cancer ([place]) 2005  . COPD (chronic obstructive pulmonary disease) ([place])   . Multiple sclerosis ([place])   . Osteoporosis   . Other diseases of vocal cords    vocal cords are not even  . Other voice and resonance disorders   . Pneumonia 2014, 1992  . Sleep apnea    no cpap used  . Unspecified hypothyroidism     Past Surgical History:  Procedure Laterality Date  . ABDOMINAL HYSTERECTOMY  2005   for cervical cancer, complete  . CONVERSION TO TOTAL HIP Right 09/18/2015   Procedure: CONVERSION OF RIGHT HEMI ARTHROPLASTY TO TOTAL HIP ARTHROPLASTY ACETABULAR REVISON ;  Surgeon: Paralee Cancel, MD;  Location: [facility];  Service: Orthopedics;  Laterality: Right;  . EYE SURGERY Bilateral    lens for cataracts  . HIP ARTHROPLASTY Right 03/01/2013   Procedure: ARTHROPLASTY BIPOLAR HIP;  Surgeon: Mauri Pole, MD;  Location: [facility];  Service: Orthopedics;  Laterality: Right;  . HIP CLOSED REDUCTION Right 03/12/2013   Procedure: CLOSED REDUCTION HIP;  Surgeon: Gearlean Alf, MD;  Location: [place];  Service: Orthopedics;  Laterality: Right;  . HIP CLOSED REDUCTION Right 03/19/2013    Procedure: CLOSED REDUCTION HIP;  Surgeon: Johnn Hai, MD;  Location: [facility];  Service: Orthopedics;  Laterality: Right;  . HIP CLOSED REDUCTION Right 11/10/2015   Procedure: CLOSED MANIPULATION HIP;  Surgeon: Rod Can, MD;  Location: [facility];  Service: Orthopedics;  Laterality: Right;  . HIP CLOSED REDUCTION Right 06/05/2017   Procedure: CLOSED REDUCTION HIP;  Surgeon: [name], MD;  Location: [facility];  Service: Orthopedics;  Laterality: Right;  . TOTAL HIP REVISION Right 06/06/2017   Procedure: TOTAL HIP REVISION;  Surgeon: [name], MD;  Location: [facility];  Service: Orthopedics;  Laterality: Right;  Marland Kitchen VESICOVAGINAL FISTULA CLOSURE W/ TAH  2005    Social History   Socioeconomic History  . Marital status: Married    Spouse name: Not on file  . Number of children: Not on file  . Years of education: Not on file  . Highest education level: Not on file  Occupational History  . 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 Smoking status: Never Smoker  . Smokeless tobacco: Never Used  Vaping Use  . Vaping Use: Never used  Substance and Sexual Activity  . Alcohol use: Yes    Alcohol/week: 2.0 standard drinks    Types: 1 Glasses of wine, 1 Cans of beer per week    Comment: 2x/week   . Drug use: No  . Sexual activity: Not on file  Other Topics Concern  . Not on file  Social History Narrative  . Not on file   Social Determinants of Health   Financial Resource Strain: Not on file  Food Insecurity: Not on file  Transportation Needs: Not on file  Physical Activity: Not on file  Stress: Not on file  Social Connections: Not on file  Intimate Partner Violence: Not on file    Family History  Problem Relation Age of Onset  . Colon cancer Father      Immunization History  Administered Date(s) Administered  . DTP 04/10/2009  . Fluad Quad(high Dose 65+) 12/29/2018  . Influenza Split 12/10/2010, 02/11/2012, 12/22/2012, 01/02/2014, 12/22/2014  .  Influenza Whole 02/19/2005, 03/28/2009, 01/23/2016  . Influenza, High Dose Seasonal PF 02/23/2018  . Influenza,inj,Quad PF,6+ Mos 02/10/2017  . Influenza-Unspecified 12/02/2018, 12/31/2018, 01/08/2019, 12/22/2019  . Moderna Sars-Covid-2 Vaccination 04/29/2019, 06/01/2019  . Pneumococcal Conjugate-13 11/06/2015  . Pneumococcal Polysaccharide-23 03/28/2009, 04/26/2019  . Tdap 04/26/2019  . Zoster Recombinat (Shingrix) 09/07/2019, 02/17/2020    Outpatient Encounter Medications as of 07/26/2020  Medication Sig  . amphetamine-dextroamphetamine (ADDERALL) 20 MG tablet Take 20 mg by mouth 2 (two) times daily.   . baclofen (LIORESAL) 20 MG tablet Take 20 mg by mouth 3 (three) times daily.   . benzonatate (TESSALON) 200 MG capsule Take 1 capsule (200 mg total) by mouth 3 (three) times daily as needed for cough.  . Calcium Carbonate-Vitamin D 600-400 MG-UNIT tablet Take 1 tablet by mouth daily.  Marland Kitchen dalfampridine 10 MG TB12   . dexlansoprazole (DEXILANT) 60 MG capsule TAKE 1 CAPSULE BY MOUTH  ONCE DAILY WITH BREAKFAST  . famotidine (PEPCID) 20 MG tablet One after supper  . FLUoxetine (PROZAC) 40 MG capsule Take 40 mg by mouth daily.  . furosemide (LASIX) 20 MG tablet TAKE 1 TABLET BY MOUTH  DAILY  . gabapentin (NEURONTIN) 600 MG tablet Take 1 tablet (600 mg total) by mouth 3 (three) times daily.  Marland Kitchen levothyroxine (SYNTHROID) 100 MCG tablet TAKE 1 TABLET BY MOUTH ONCE DAILY BEFORE BREAKFAST  . mometasone-formoterol (DULERA) 100-5 MCG/ACT AERO USE 2 PUFFS EVERY 12 HOURS.  . Multiple Vitamin (MULTIVITAMIN WITH MINERALS) TABS tablet Take 1 tablet by mouth daily.  Marland Kitchen MYRBETRIQ 25 MG TB24 tablet Take 1 tablet by mouth daily.  Marland Kitchen ocrelizumab (OCREVUS) 300 MG/10ML injection Inject 300 mg into the vein every 6 (six) months.   . Respiratory Therapy Supplies (FLUTTER) DEVI Use as directed  . tamsulosin (FLOMAX) 0.4 MG CAPS capsule Take 0.4 mg by mouth.  . temazepam (RESTORIL) 15 MG capsule Take 15 mg by mouth at  bedtime.  . VENTOLIN HFA 108 (90 Base) MCG/ACT inhaler INHALE 2 PUFFS INTO LUNGS EVERY 6 HOURS AS NEEDED.  . [DISCONTINUED] predniSONE (DELTASONE) 10 MG tablet Take  4 each am x 2 days,   2 each am x 2 days,  1 each am x 2 days and stop   No facility-administered encounter medications on file as of 07/26/2020.     ROS: Pertinent positives and negatives noted in HPI. Remainder of ROS non-contributory    Allergies  Allergen Reactions  . Tramadol     hallucinations   . Contrast Media [Iodinated Diagnostic Agents] Rash  . Iohexol Hives  . Methylprednisolone Other (See Comments)    Reaction:  Decreases pts heart rate   . Morphine And Related Other (See Comments)    Reaction:  Hallucinations   . Oxycodone-Acetaminophen Rash and Other (See Comments)    Reaction:  Hallucinations   .  Phenytoin Sodium Extended Rash  . Zanaflex [Tizanidine Hydrochloride] Rash    BP 126/80 (BP Location: Left Arm, Patient Position: Sitting, Cuff Size: Normal)   Pulse 83   Temp 98.6 F (37 C) (Oral)   Ht 5\' 3"  (1.6 m)   Wt 120 lb 12.8 oz (54.8 kg)   SpO2 100%   BMI 21.40 kg/m   Wt Readings from Last 3 Encounters:  07/26/20 120 lb 12.8 oz (54.8 kg)  07/18/20 120 lb 3.2 oz (54.5 kg)  06/07/20 126 lb (57.2 kg)   Temp Readings from Last 3 Encounters:  07/26/20 98.6 F (37 C) (Oral)  07/18/20 (!) 97.1 F (36.2 C) (Temporal)  06/07/20 (!) 97.5 F (36.4 C) (Temporal)   BP Readings from Last 3 Encounters:  07/26/20 126/80  07/18/20 130/72  06/07/20 (!) 144/62   Pulse Readings from Last 3 Encounters:  07/26/20 83  07/18/20 80  06/07/20 (!) 55     Physical Exam Constitutional:      General: She is not in acute distress. Cardiovascular:     Rate and Rhythm: Normal rate and regular rhythm.     Pulses: Normal pulses.  Pulmonary:     Effort: Pulmonary effort is normal. No respiratory distress.     Breath sounds: No wheezing or rhonchi.  Musculoskeletal:     Cervical back: Neck supple. No  tenderness.  Lymphadenopathy:     Cervical: No cervical adenopathy.  Neurological:     Mental Status: She is alert and oriented to person, place, and time. Mental status is at baseline.  Psychiatric:        Mood and Affect: Mood normal.        Behavior: Behavior normal.      A/P:  1. Hypothyroidism - on levothyroxine 119mcg daily - TSH - T4, free  2. Hyperlipidemia, unspecified hyperlipidemia type - not on medication - AST - ALT - Lipid panel  3. Multiple sclerosis ([place]) - cont current meds - cont regular f/u with neuro  4. Periodic limb movements of sleep - on restoril, cont this  5. Hyponatremia - CBC - Basic metabolic panel  6. Osteoporosis without current pathological fracture, unspecified osteoporosis type - dexa in 04/2020 w/ T-score = -3.0. Pt is starting prolia injections (not thru our office)  7. Urinary retention - self-cath 2x/day - cont flomax and myrbetriq - cont f/u with urology   This visit occurred during the SARS-CoV-2 public health emergency.  Safety protocols were in place, including screening questions prior to the visit, additional usage of staff PPE, and extensive cleaning of exam room while observing appropriate contact time as indicated for disinfecting solutions.

## 2020-07-27 ENCOUNTER — Encounter: Payer: Self-pay | Admitting: Family Medicine

## 2020-08-02 MED ORDER — AMOXICILLIN-POT CLAVULANATE 875-125 MG PO TABS: 1.0000 | ORAL_TABLET | Freq: Two times a day (BID) | ORAL | 0 refills | Status: DC

## 2020-08-02 NOTE — Telephone Encounter (Signed)
Try Augmentin 875 mg take one pill twice daily  X 10 days - take at breakfast and supper with large glass of water.  It would help reduce the usual side effects (diarrhea and yeast infections) if you ate cultured yogurt at lunch.   Plus mucinex dm  1200 mg every 12 hours and needs a flutter valve if doesn't already have one

## 2020-08-02 NOTE — Telephone Encounter (Signed)
Called and spoke with patient. She verbalized understanding of MW's recs. Will go ahead and send in the Augmentin for her.   Nothing further needed at time of call.

## 2020-08-02 NOTE — Telephone Encounter (Signed)
Called patient due to the MyChart message she sent. She stated that the cough has been steady since March 1st. She has been on several rounds of prednisone and zpaks. The cough has gotten worse over the last few days. She has been coughing up thick, brown phlegm. She still taking the Tessalon perles, Pepcid AC and gabapentin but they do not seem to be working. She denied any increased SOB or wheezing. Also denied any fevers. She has not been around anyone who has been sick recently.   Pharmacy is [facility].   MW, can you please advise? Thanks!

## 2020-08-07 ENCOUNTER — Encounter: Payer: Self-pay | Admitting: Gastroenterology

## 2020-08-09 NOTE — Telephone Encounter (Signed)
MW please advise. Thanks  Cough has not gotten any better. Tougue is red and hurts. Still no fever. Mucus is still there.

## 2020-08-09 NOTE — Telephone Encounter (Signed)
Try magic mouthwash and ask her to see her ent doc asap

## 2020-08-10 ENCOUNTER — Other Ambulatory Visit: Payer: Self-pay | Admitting: *Deleted

## 2020-08-10 ENCOUNTER — Telehealth: Payer: Self-pay | Admitting: Internal Medicine

## 2020-08-10 ENCOUNTER — Telehealth: Payer: Self-pay | Admitting: *Deleted

## 2020-08-10 MED ORDER — MAGIC MOUTHWASH: 15.0000 mL | Freq: Four times a day (QID) | ORAL | 0 refills | Status: DC

## 2020-08-10 NOTE — Telephone Encounter (Signed)
Pt has an Upcoming appt on June 7th, routing to Dr Melvyn Novas as an Juluis Rainier.

## 2020-08-10 NOTE — Telephone Encounter (Signed)
Called and spoke with patient, advised that the prescription has already been sent to her pharmacy ([facility]).  She verbalized understanding.  Nothing further needed.

## 2020-08-10 NOTE — Telephone Encounter (Signed)
[place] and spoke with Jerene Pitch one of the Pharmacist and gave an order for 15 mls 4 times daily for 7 days, no refills.  Nothing further needed.

## 2020-08-10 NOTE — Telephone Encounter (Signed)
Yes ,  rx MMW  Standard dosing

## 2020-08-14 DIAGNOSIS — Z23 Encounter for immunization: Secondary | ICD-10-CM | POA: Diagnosis not present

## 2020-08-16 DIAGNOSIS — R059 Cough, unspecified: Secondary | ICD-10-CM | POA: Diagnosis not present

## 2020-08-16 DIAGNOSIS — J3802 Paralysis of vocal cords and larynx, bilateral: Secondary | ICD-10-CM | POA: Diagnosis not present

## 2020-08-24 ENCOUNTER — Encounter: Payer: Self-pay | Admitting: Gastroenterology

## 2020-09-11 ENCOUNTER — Encounter: Payer: Self-pay | Admitting: Family Medicine

## 2020-09-11 ENCOUNTER — Ambulatory Visit (INDEPENDENT_AMBULATORY_CARE_PROVIDER_SITE_OTHER): Payer: Medicare Other | Admitting: *Deleted

## 2020-09-11 DIAGNOSIS — Z0000 Encounter for general adult medical examination without abnormal findings: Secondary | ICD-10-CM

## 2020-09-11 NOTE — Progress Notes (Signed)
Subjective:   Kayla [name] is a 70 y.o. female who presents for Medicare Annual (Subsequent) preventive examination.  I connected with  [name] on 09/11/20 by a telephone enabled telemedicine application and verified that I am speaking with the correct person using two identifiers.   I discussed the limitations of evaluation and management by telemedicine. The patient expressed understanding and agreed to proceed.  Patient location: home  Provider location: telephone visit    Review of Systems   na   Cardiac Risk Factors include: advanced age (>45men, >20 women) (multiple sclerosis)     Objective:    There were no vitals filed for this visit. There is no height or weight on file to calculate BMI.  Advanced Directives 09/11/2020 10/27/2019 04/10/2019 04/15/2018 02/25/2018 06/05/2017 01/07/2017  Does Patient Have a Medical Advance Directive? Yes Yes Yes Yes Yes Yes Yes  Type of Paramedic of [place];Living will [place];Living will [place];Living will [place];Living will [place];Living will [place];Living will Living will;Healthcare Power of Attorney  Does patient want to make changes to medical advance directive? - - - - No - Patient declined No - Patient declined -  Copy of [place] in Chart? No - copy requested - No - copy requested (No Data) (No Data) No - copy requested -  Would patient like information on creating a medical advance directive? - - - - - - -  Pre-existing out of facility DNR order (yellow form or pink MOST form) - - - - - - -    Current Medications (verified) Outpatient Encounter Medications as of 09/11/2020  Medication Sig   amphetamine-dextroamphetamine (ADDERALL) 20 MG tablet Take 20 mg by mouth 2 (two) times daily.    baclofen (LIORESAL) 20 MG tablet Take 20 mg by mouth 3 (three) times daily.     benzonatate (TESSALON) 200 MG capsule Take 1 capsule (200 mg total) by mouth 3 (three) times daily as needed for cough.   Calcium Carbonate-Vitamin D 600-400 MG-UNIT tablet Take 1 tablet by mouth daily.   dalfampridine 10 MG TB12    dexlansoprazole (DEXILANT) 60 MG capsule TAKE 1 CAPSULE BY MOUTH  ONCE DAILY WITH BREAKFAST   famotidine (PEPCID) 20 MG tablet One after supper   FLUoxetine (PROZAC) 40 MG capsule Take 40 mg by mouth daily.   furosemide (LASIX) 20 MG tablet TAKE 1 TABLET BY MOUTH  DAILY   gabapentin (NEURONTIN) 600 MG tablet Take 1 tablet (600 mg total) by mouth 3 (three) times daily.   levothyroxine (SYNTHROID) 100 MCG tablet TAKE 1 TABLET BY MOUTH ONCE DAILY BEFORE BREAKFAST   mometasone-formoterol (DULERA) 100-5 MCG/ACT AERO USE 2 PUFFS EVERY 12 HOURS.   Multiple Vitamin (MULTIVITAMIN WITH MINERALS) TABS tablet Take 1 tablet by mouth daily.   MYRBETRIQ 25 MG TB24 tablet Take 1 tablet by mouth daily.   ocrelizumab (OCREVUS) 300 MG/10ML injection Inject 300 mg into the vein every 6 (six) months.    Respiratory Therapy Supplies (FLUTTER) DEVI Use as directed   tamsulosin (FLOMAX) 0.4 MG CAPS capsule Take 0.4 mg by mouth.   temazepam (RESTORIL) 15 MG capsule Take 15 mg by mouth at bedtime.   VENTOLIN HFA 108 (90 Base) MCG/ACT inhaler INHALE 2 PUFFS INTO LUNGS EVERY 6 HOURS AS NEEDED.   amoxicillin-clavulanate (AUGMENTIN) 875-125 MG tablet Take 1 tablet by mouth 2 (two) times daily.   magic mouthwash SOLN  Take 15 mLs by mouth 4 (four) times daily.   No facility-administered encounter medications on file as of 09/11/2020.    Allergies (verified) Tramadol, Contrast media [iodinated diagnostic agents], Iohexol, Methylprednisolone, Morphine and related, Oxycodone-acetaminophen, Phenytoin sodium extended, and Zanaflex [tizanidine hydrochloride]   History: Past Medical History:  Diagnosis Date   Arthritis    oa   Cervical cancer ([place]) 2005   COPD (chronic obstructive pulmonary  disease) ([place])    Multiple sclerosis ([place])    Osteoporosis    Other diseases of vocal cords    vocal cords are not even   Other voice and resonance disorders    Pneumonia 2014, 1992   Sleep apnea    no cpap used   Unspecified hypothyroidism    Past Surgical History:  Procedure Laterality Date   ABDOMINAL HYSTERECTOMY  2005   for cervical cancer, complete   CONVERSION TO TOTAL HIP Right 09/18/2015   Procedure: CONVERSION OF RIGHT HEMI ARTHROPLASTY TO TOTAL HIP ARTHROPLASTY ACETABULAR REVISON ;  Surgeon: Paralee Cancel, MD;  Location: [facility];  Service: Orthopedics;  Laterality: Right;   EYE SURGERY Bilateral    lens for cataracts   HIP ARTHROPLASTY Right 03/01/2013   Procedure: ARTHROPLASTY BIPOLAR HIP;  Surgeon: Mauri Pole, MD;  Location: [facility];  Service: Orthopedics;  Laterality: Right;   HIP CLOSED REDUCTION Right 03/12/2013   Procedure: CLOSED REDUCTION HIP;  Surgeon: Gearlean Alf, MD;  Location: [place];  Service: Orthopedics;  Laterality: Right;   HIP CLOSED REDUCTION Right 03/19/2013   Procedure: CLOSED REDUCTION HIP;  Surgeon: Johnn Hai, MD;  Location: [place];  Service: Orthopedics;  Laterality: Right;   HIP CLOSED REDUCTION Right 11/10/2015   Procedure: CLOSED MANIPULATION HIP;  Surgeon: Rod Can, MD;  Location: [facility];  Service: Orthopedics;  Laterality: Right;   HIP CLOSED REDUCTION Right 06/05/2017   Procedure: CLOSED REDUCTION HIP;  Surgeon: [name], MD;  Location: [facility];  Service: Orthopedics;  Laterality: Right;   TOTAL HIP REVISION Right 06/06/2017   Procedure: TOTAL HIP REVISION;  Surgeon: [name], MD;  Location: [facility];  Service: Orthopedics;  Laterality: Right;   VESICOVAGINAL FISTULA CLOSURE W/ TAH  2005   Family History  Problem Relation Age of Onset   Colon cancer Father    Social History   Socioeconomic History   Marital status: Married    Spouse name: Not on file   Number of children: Not on file   Years of education: Not on  file   Highest education level: Not on file  Occupational History   Occupation: disabled [employer]  Tobacco Use   Smoking status: Never   Smokeless tobacco: Never  Vaping Use   Vaping Use: Never used  Substance and Sexual Activity   Alcohol use: Yes    Alcohol/week: 2.0 standard drinks    Types: 1 Glasses of wine, 1 Cans of beer per week    Comment: 2x/week    Drug use: No   Sexual activity: Not on file  Other Topics Concern   Not on file  Social History Narrative   Not on file   Social Determinants of Health   Financial Resource Strain: Low Risk    Difficulty of Paying Living Expenses: Not hard at all  Food Insecurity: No Food Insecurity   Worried About Charity fundraiser in the Last Year: Never true   Ran Out of Food in the Last Year: Never true  Transportation Needs: No Transportation  Needs   Lack of Transportation (Medical): No   Lack of Transportation (Non-Medical): No  Physical Activity: Insufficiently Active   Days of Exercise per Week: 2 days   Minutes of Exercise per Session: 30 min  Stress: No Stress Concern Present   Feeling of Stress : Not at all  Social Connections: Moderately Integrated   Frequency of Communication with Friends and Family: More than three times a week   Frequency of Social Gatherings with Friends and Family: Twice a week   Attends Religious Services: More than 4 times per year   Active Member of [employer] or Organizations: No   Attends Music therapist: Never   Marital Status: Married    Tobacco Counseling Counseling given: Not Answered   Clinical Intake:  Pre-visit preparation completed: Yes  Pain : No/denies pain     Nutritional Risks: None Diabetes: No  How often do you need to have someone help you when you read instructions, pamphlets, or other written materials from your doctor or pharmacy?: 1 - Never  Diabetic?  NA  Interpreter Needed?: No  Information entered by :: Leroy Kennedy LPN   Activities  of Daily Living In your present state of health, do you have any difficulty performing the following activities: 09/11/2020  Hearing? N  Vision? N  Difficulty concentrating or making decisions? N  Walking or climbing stairs? N  Dressing or bathing? Y  Doing errands, shopping? N  Preparing Food and eating ? N  Using the Toilet? N  In the past six months, have you accidently leaked urine? N  Do you have problems with loss of bowel control? N  Managing your Medications? N  Managing your Finances? N  Housekeeping or managing your Housekeeping? N  Some recent data might be hidden    Patient Care Team: Ronnald Nian, DO as PCP - General (Family Medicine) Delphia Grates, MD (Neurology) Melida Quitter, MD (Otolaryngology)  Indicate any recent Medical Services you may have received from other than [facility] providers in the past year (date may be approximate).     Assessment:   This is a routine wellness examination for Kayla [name].  Hearing/Vision screen Hearing Screening - Comments:: No trouble hearing Vision Screening - Comments:: 2 times a year  [facility]   [facility]  Dietary issues and exercise activities discussed: Current Exercise Habits: Structured exercise class, Time (Minutes): 30, Frequency (Times/Week): 2, Weekly Exercise (Minutes/Week): 60, Intensity: Mild   Goals Addressed             This Visit's Progress    Patient Stated       Get more strength in upper body        Depression Screen PHQ 2/9 Scores 09/11/2020 01/20/2020  PHQ - 2 Score 0 0    Fall Risk Fall Risk  09/11/2020 01/20/2020  Falls in the past year? 0 1  Number falls in past yr: 0 1  Injury with Fall? 0 0  Follow up Falls evaluation completed;Falls prevention discussed -    FALL RISK PREVENTION PERTAINING TO THE HOME:  Any stairs in or around the home? No  If so, are there any without handrails? No  Home free of loose throw rugs in walkways, pet beds, electrical cords, etc? Yes   Adequate lighting in your home to reduce risk of falls? Yes   ASSISTIVE DEVICES UTILIZED TO PREVENT FALLS:  Life alert? No  Use of a cane, walker or w/c? Yes  Grab bars in the  bathroom? Yes  Shower chair or bench in shower? Yes  Elevated toilet seat or a handicapped toilet? Yes   TIMED UP AND GO:  Was the test performed? No .   Video - visit    Cognitive Function:  Normal cognitive status assessed by direct observation by this Nurse Health Advisor. No abnormalities found.          Immunizations Immunization History  Administered Date(s) Administered   DTP 04/10/2009   Fluad Quad(high Dose 65+) 12/29/2018   Influenza Split 12/10/2010, 02/11/2012, 12/22/2012, 01/02/2014, 12/22/2014   Influenza Whole 02/19/2005, 03/28/2009, 01/23/2016   Influenza, High Dose Seasonal PF 02/23/2018   Influenza,inj,Quad PF,6+ Mos 02/10/2017   Influenza-Unspecified 12/02/2018, 12/31/2018, 01/08/2019, 12/22/2019   Moderna Sars-Covid-2 Vaccination 04/29/2019, 06/01/2019   Pneumococcal Conjugate-13 11/06/2015   Pneumococcal Polysaccharide-23 03/28/2009, 04/26/2019   Tdap 04/26/2019   Zoster Recombinat (Shingrix) 09/07/2019, 02/17/2020    TDAP status: Up to date  Flu Vaccine status: Up to date  Pneumococcal vaccine status: Up to date  Covid-19 vaccine status: Completed vaccines  Qualifies for Shingles Vaccine? No   Zostavax completed No   Shingrix Completed?: Yes  Screening Tests Health Maintenance  Topic Date Due   Hepatitis C Screening  Never done   COVID-19 Vaccine (3 - Moderna risk series) 06/29/2019   COLONOSCOPY (Pts 45-74yrs Insurance coverage will need to be confirmed)  06/20/2020   INFLUENZA VACCINE  10/22/2020   MAMMOGRAM  12/30/2020   TETANUS/TDAP  04/25/2029   DEXA SCAN  Completed   PNA vac Low Risk Adult  Completed   Zoster Vaccines- Shingrix  Completed   HPV VACCINES  Aged Out    Health Maintenance  Health Maintenance Due  Topic Date Due   Hepatitis C  Screening  Never done   COVID-19 Vaccine (3 - Moderna risk series) 06/29/2019   COLONOSCOPY (Pts 45-38yrs Insurance coverage will need to be confirmed)  06/20/2020    Colorectal cancer screening: Type of screening: Colonoscopy. Completed 06-21-2015. Repeat every 5 years   Schedule 10-2020  Mammogram status: Completed  . Repeat every year  Bone Density status: Completed 05-27-2019. Results reflect: Bone density results: OSTEOPOROSIS. Repeat every 2 years.  Lung Cancer Screening: (Low Dose CT Chest recommended if Age 25-80 years, 30 pack-year currently smoking OR have quit w/in 15years.) does not qualify.   Lung Cancer Screening Referral: na  Additional Screening:  Hepatitis C Screening:  qualify  Vision Screening: Recommended annual ophthalmology exams for early detection of glaucoma and other disorders of the eye. Is the patient up to date with their annual eye exam?  Yes  Who is the provider or what is the name of the office in which the patient attends annual eye exams? Dr. Alois Cliche  2 X a year If pt is not established with a provider, would they like to be referred to a provider to establish care?  established .  Dental Screening: Recommended annual dental exams for proper oral hygiene  Community Resource Referral / Chronic Care Management: CRR required this visit?  No   CCM required this visit?  No      Plan:     I have personally reviewed and noted the following in the patient's chart:   Medical and social history Use of alcohol, tobacco or illicit drugs  Current medications and supplements including opioid prescriptions.  Functional ability and status Nutritional status Physical activity Advanced directives List of other physicians Hospitalizations, surgeries, and ER visits in previous 12 months Vitals Screenings to include  cognitive, depression, and falls Referrals and appointments  In addition, I have reviewed and discussed with patient certain preventive  protocols, quality metrics, and best practice recommendations. A written personalized care plan for preventive services as well as general preventive health recommendations were provided to patient.     Leroy Kennedy, LPN   [id number]   Nurse Notes    NA

## 2020-09-11 NOTE — Patient Instructions (Signed)
Kayla [name] , Thank you for taking time to come for your Medicare Wellness Visit. I appreciate your ongoing commitment to your health goals. Please review the following plan we discussed and let me know if I can assist you in the future.   Screening recommendations/referrals: Colonoscopy: scheduled 10-2020  Mammogram: Education provided Bone Density: Education provided  Recommended yearly ophthalmology/optometry visit for glaucoma screening and checkup Recommended yearly dental visit for hygiene and checkup  Vaccinations: Influenza vaccine: up to date Pneumococcal vaccine: up to date Tdap vaccine: up to date Shingles vaccine: up to date    Advanced directives: copy requested  Conditions/risks identified: NA A Next appointment: 11-9- @ 9:00 am Dr. Cathleen Corti   Preventive Care 68 Years and Older, Female Preventive care refers to lifestyle choices and visits with your health care provider that can promote health and wellness. What does preventive care include? A yearly physical exam. This is also called an annual well check. Dental exams once or twice a year. Routine eye exams. Ask your health care provider how often you should have your eyes checked. Personal lifestyle choices, including: Daily care of your teeth and gums. Regular physical activity. Eating a healthy diet. Avoiding tobacco and drug use. Limiting alcohol use. Practicing safe sex. Taking low-dose aspirin every day. Taking vitamin and mineral supplements as recommended by your health care provider. What happens during an annual well check? The services and screenings done by your health care provider during your annual well check will depend on your age, overall health, lifestyle risk factors, and family history of disease. Counseling  Your health care provider may ask you questions about your: Alcohol use. Tobacco use. Drug use. Emotional well-being. Home and relationship well-being. Sexual activity. Eating  habits. History of falls. Memory and ability to understand (cognition). Work and work Statistician. Reproductive health. Screening  You may have the following tests or measurements: Height, weight, and BMI. Blood pressure. Lipid and cholesterol levels. These may be checked every 5 years, or more frequently if you are over 31 years old. Skin check. Lung cancer screening. You may have this screening every year starting at age 40 if you have a 30-pack-year history of smoking and currently smoke or have quit within the past 15 years. Fecal occult blood test (FOBT) of the stool. You may have this test every year starting at age 6. Flexible sigmoidoscopy or colonoscopy. You may have a sigmoidoscopy every 5 years or a colonoscopy every 10 years starting at age 25. Hepatitis C blood test. Hepatitis B blood test. Sexually transmitted disease (STD) testing. Diabetes screening. This is done by checking your blood sugar (glucose) after you have not eaten for a while (fasting). You may have this done every 1-3 years. Bone density scan. This is done to screen for osteoporosis. You may have this done starting at age 57. Mammogram. This may be done every 1-2 years. Talk to your health care provider about how often you should have regular mammograms. Talk with your health care provider about your test results, treatment options, and if necessary, the need for more tests. Vaccines  Your health care provider may recommend certain vaccines, such as: Influenza vaccine. This is recommended every year. Tetanus, diphtheria, and acellular pertussis (Tdap, Td) vaccine. You may need a Td booster every 10 years. Zoster vaccine. You may need this after age 45. Pneumococcal 13-valent conjugate (PCV13) vaccine. One dose is recommended after age 14. Pneumococcal polysaccharide (PPSV23) vaccine. One dose is recommended after age 32. Talk to your health  care provider about which screenings and vaccines you need and how  often you need them. This information is not intended to replace advice given to you by your health care provider. Make sure you discuss any questions you have with your health care provider. Document Released: 04/06/2015 Document Revised: 11/28/2015 Document Reviewed: 01/09/2015 Elsevier Interactive Patient Education  2017 [place] Prevention in the Home Falls can cause injuries. They can happen to people of all ages. There are many things you can do to make your home safe and to help prevent falls. What can I do on the outside of my home? Regularly fix the edges of walkways and driveways and fix any cracks. Remove anything that might make you trip as you walk through a door, such as a raised step or threshold. Trim any bushes or trees on the path to your home. Use bright outdoor lighting. Clear any walking paths of anything that might make someone trip, such as rocks or tools. Regularly check to see if handrails are loose or broken. Make sure that both sides of any steps have handrails. Any raised decks and porches should have guardrails on the edges. Have any leaves, snow, or ice cleared regularly. Use sand or salt on walking paths during winter. Clean up any spills in your garage right away. This includes oil or grease spills. What can I do in the bathroom? Use night lights. Install grab bars by the toilet and in the tub and shower. Do not use towel bars as grab bars. Use non-skid mats or decals in the tub or shower. If you need to sit down in the shower, use a plastic, non-slip stool. Keep the floor dry. Clean up any water that spills on the floor as soon as it happens. Remove soap buildup in the tub or shower regularly. Attach bath mats securely with double-sided non-slip rug tape. Do not have throw rugs and other things on the floor that can make you trip. What can I do in the bedroom? Use night lights. Make sure that you have a light by your bed that is easy to  reach. Do not use any sheets or blankets that are too big for your bed. They should not hang down onto the floor. Have a firm chair that has side arms. You can use this for support while you get dressed. Do not have throw rugs and other things on the floor that can make you trip. What can I do in the kitchen? Clean up any spills right away. Avoid walking on wet floors. Keep items that you use a lot in easy-to-reach places. If you need to reach something above you, use a strong step stool that has a grab bar. Keep electrical cords out of the way. Do not use floor polish or wax that makes floors slippery. If you must use wax, use non-skid floor wax. Do not have throw rugs and other things on the floor that can make you trip. What can I do with my stairs? Do not leave any items on the stairs. Make sure that there are handrails on both sides of the stairs and use them. Fix handrails that are broken or loose. Make sure that handrails are as long as the stairways. Check any carpeting to make sure that it is firmly attached to the stairs. Fix any carpet that is loose or worn. Avoid having throw rugs at the top or bottom of the stairs. If you do have throw rugs, attach them to  the floor with carpet tape. Make sure that you have a light switch at the top of the stairs and the bottom of the stairs. If you do not have them, ask someone to add them for you. What else can I do to help prevent falls? Wear shoes that: Do not have high heels. Have rubber bottoms. Are comfortable and fit you well. Are closed at the toe. Do not wear sandals. If you use a stepladder: Make sure that it is fully opened. Do not climb a closed stepladder. Make sure that both sides of the stepladder are locked into place. Ask someone to hold it for you, if possible. Clearly mark and make sure that you can see: Any grab bars or handrails. First and last steps. Where the edge of each step is. Use tools that help you move  around (mobility aids) if they are needed. These include: Canes. Walkers. Scooters. Crutches. Turn on the lights when you go into a dark area. Replace any light bulbs as soon as they burn out. Set up your furniture so you have a clear path. Avoid moving your furniture around. If any of your floors are uneven, fix them. If there are any pets around you, be aware of where they are. Review your medicines with your doctor. Some medicines can make you feel dizzy. This can increase your chance of falling. Ask your doctor what other things that you can do to help prevent falls. This information is not intended to replace advice given to you by your health care provider. Make sure you discuss any questions you have with your health care provider. Document Released: 01/04/2009 Document Revised: 08/16/2015 Document Reviewed: 04/14/2014 Elsevier Interactive Patient Education  2017 [employer].

## 2020-09-26 NOTE — Telephone Encounter (Signed)
Called and spoke with pt and scheduled her for appt tomorrow with MW.  She was advised to bring in all her meds with her.  Pt voiced her understanding.

## 2020-09-26 NOTE — Telephone Encounter (Signed)
Dr. Melvyn Novas please advise on the following My Chart messages. Thank you.

## 2020-09-26 NOTE — Telephone Encounter (Signed)
I don't have any other ideas but happy to work in tomorrow in the afternoon if she'll bring all meds including otcs' with her to office to regroup

## 2020-09-27 ENCOUNTER — Emergency Department (HOSPITAL_COMMUNITY): Payer: Medicare Other

## 2020-09-27 ENCOUNTER — Other Ambulatory Visit: Payer: Self-pay

## 2020-09-27 ENCOUNTER — Encounter (HOSPITAL_COMMUNITY): Payer: Self-pay

## 2020-09-27 ENCOUNTER — Inpatient Hospital Stay: Payer: Medicare Other | Admitting: Internal Medicine

## 2020-09-27 ENCOUNTER — Inpatient Hospital Stay (HOSPITAL_COMMUNITY)
Admission: EM | Admit: 2020-09-27 | Discharge: 2020-09-29 | DRG: 179 | Disposition: A | Discharge status: Home / Self Care | Payer: Medicare Other | Attending: Family Medicine | Admitting: Family Medicine

## 2020-09-27 DIAGNOSIS — G35 Multiple sclerosis: Secondary | ICD-10-CM | POA: Diagnosis present

## 2020-09-27 DIAGNOSIS — Z79890 Hormone replacement therapy (postmenopausal): Secondary | ICD-10-CM

## 2020-09-27 DIAGNOSIS — K219 Gastro-esophageal reflux disease without esophagitis: Secondary | ICD-10-CM | POA: Diagnosis present

## 2020-09-27 DIAGNOSIS — Z96641 Presence of right artificial hip joint: Secondary | ICD-10-CM | POA: Diagnosis present

## 2020-09-27 DIAGNOSIS — Z90710 Acquired absence of both cervix and uterus: Secondary | ICD-10-CM

## 2020-09-27 DIAGNOSIS — J449 Chronic obstructive pulmonary disease, unspecified: Secondary | ICD-10-CM | POA: Diagnosis not present

## 2020-09-27 DIAGNOSIS — E039 Hypothyroidism, unspecified: Secondary | ICD-10-CM | POA: Diagnosis not present

## 2020-09-27 DIAGNOSIS — J189 Pneumonia, unspecified organism: Secondary | ICD-10-CM | POA: Diagnosis not present

## 2020-09-27 DIAGNOSIS — J690 Pneumonitis due to inhalation of food and vomit: Secondary | ICD-10-CM | POA: Diagnosis not present

## 2020-09-27 DIAGNOSIS — Z888 Allergy status to other drugs, medicaments and biological substances status: Secondary | ICD-10-CM

## 2020-09-27 DIAGNOSIS — Z7951 Long term (current) use of inhaled steroids: Secondary | ICD-10-CM

## 2020-09-27 DIAGNOSIS — Z20822 Contact with and (suspected) exposure to covid-19: Secondary | ICD-10-CM | POA: Diagnosis not present

## 2020-09-27 DIAGNOSIS — J188 Other pneumonia, unspecified organism: Secondary | ICD-10-CM | POA: Diagnosis present

## 2020-09-27 DIAGNOSIS — J3802 Paralysis of vocal cords and larynx, bilateral: Secondary | ICD-10-CM | POA: Diagnosis present

## 2020-09-27 DIAGNOSIS — Z79899 Other long term (current) drug therapy: Secondary | ICD-10-CM

## 2020-09-27 DIAGNOSIS — R059 Cough, unspecified: Secondary | ICD-10-CM | POA: Diagnosis not present

## 2020-09-27 DIAGNOSIS — R600 Localized edema: Secondary | ICD-10-CM | POA: Diagnosis present

## 2020-09-27 DIAGNOSIS — R918 Other nonspecific abnormal finding of lung field: Secondary | ICD-10-CM

## 2020-09-27 DIAGNOSIS — M1990 Unspecified osteoarthritis, unspecified site: Secondary | ICD-10-CM | POA: Diagnosis present

## 2020-09-27 DIAGNOSIS — K449 Diaphragmatic hernia without obstruction or gangrene: Secondary | ICD-10-CM | POA: Diagnosis present

## 2020-09-27 DIAGNOSIS — I700 Atherosclerosis of aorta: Secondary | ICD-10-CM | POA: Diagnosis not present

## 2020-09-27 DIAGNOSIS — Z885 Allergy status to narcotic agent status: Secondary | ICD-10-CM

## 2020-09-27 DIAGNOSIS — R1314 Dysphagia, pharyngoesophageal phase: Secondary | ICD-10-CM | POA: Diagnosis present

## 2020-09-27 DIAGNOSIS — E785 Hyperlipidemia, unspecified: Secondary | ICD-10-CM | POA: Diagnosis present

## 2020-09-27 DIAGNOSIS — M810 Age-related osteoporosis without current pathological fracture: Secondary | ICD-10-CM | POA: Diagnosis present

## 2020-09-27 DIAGNOSIS — G4733 Obstructive sleep apnea (adult) (pediatric): Secondary | ICD-10-CM | POA: Diagnosis present

## 2020-09-27 DIAGNOSIS — Z91041 Radiographic dye allergy status: Secondary | ICD-10-CM

## 2020-09-27 DIAGNOSIS — Z8701 Personal history of pneumonia (recurrent): Secondary | ICD-10-CM

## 2020-09-27 DIAGNOSIS — Z8541 Personal history of malignant neoplasm of cervix uteri: Secondary | ICD-10-CM

## 2020-09-27 LAB — BASIC METABOLIC PANEL
Anion gap: 11 (ref 5–15)
BUN: 11 mg/dL (ref 8–23)
CO2: 26 mmol/L (ref 22–32)
Calcium: 9.2 mg/dL (ref 8.9–10.3)
Chloride: 101 mmol/L (ref 98–111)
Creatinine, Ser: 0.67 mg/dL (ref 0.44–1.00)
GFR, Estimated: >60 mL/min (ref >60)
Glucose, Bld: 92 mg/dL (ref 70–99)
Potassium: 4 mmol/L (ref 3.5–5.1)
Sodium: 138 mmol/L (ref 135–145)

## 2020-09-27 LAB — CBC
HCT: 32.2 % — ABNORMAL LOW (ref 36.0–46.0)
Hemoglobin: 10.2 g/dL — ABNORMAL LOW (ref 12.0–15.0)
MCH: 28.1 pg (ref 26.0–34.0)
MCHC: 31.7 g/dL (ref 30.0–36.0)
MCV: 88.7 fL (ref 80.0–100.0)
Platelets: 377 10*3/uL (ref 150–400)
RBC: 3.63 MIL/uL — ABNORMAL LOW (ref 3.87–5.11)
RDW: 14.4 % (ref 11.5–15.5)
WBC: 11.3 10*3/uL — ABNORMAL HIGH (ref 4.0–10.5)
nRBC: 0 % (ref 0.0–0.2)

## 2020-09-27 LAB — CBC WITH DIFFERENTIAL/PLATELET
Abs Immature Granulocytes: 0.07 10*3/uL (ref 0.00–0.07)
Basophils Absolute: 0 10*3/uL (ref 0.0–0.1)
Basophils Relative: 0 %
Eosinophils Absolute: 0.1 10*3/uL (ref 0.0–0.5)
Eosinophils Relative: 1 %
HCT: 33.8 % — ABNORMAL LOW (ref 36.0–46.0)
Hemoglobin: 10.5 g/dL — ABNORMAL LOW (ref 12.0–15.0)
Immature Granulocytes: 1 %
Lymphocytes Relative: 8 %
Lymphs Abs: 0.9 10*3/uL (ref 0.7–4.0)
MCH: 27.3 pg (ref 26.0–34.0)
MCHC: 31.1 g/dL (ref 30.0–36.0)
MCV: 88 fL (ref 80.0–100.0)
Monocytes Absolute: 1.1 10*3/uL — ABNORMAL HIGH (ref 0.1–1.0)
Monocytes Relative: 10 %
Neutro Abs: 9.4 10*3/uL — ABNORMAL HIGH (ref 1.7–7.7)
Neutrophils Relative %: 80 %
Platelets: 453 10*3/uL — ABNORMAL HIGH (ref 150–400)
RBC: 3.84 MIL/uL — ABNORMAL LOW (ref 3.87–5.11)
RDW: 14.4 % (ref 11.5–15.5)
WBC: 11.6 10*3/uL — ABNORMAL HIGH (ref 4.0–10.5)
nRBC: 0 % (ref 0.0–0.2)

## 2020-09-27 LAB — MRSA NEXT GEN BY PCR, NASAL: MRSA by PCR Next Gen: NOT DETECTED

## 2020-09-27 LAB — LACTIC ACID, PLASMA
Lactic Acid, Venous: 1.2 mmol/L (ref 0.5–1.9)
Lactic Acid, Venous: 2 mmol/L (ref 0.5–1.9)

## 2020-09-27 LAB — CREATININE, SERUM
Creatinine, Ser: 0.67 mg/dL (ref 0.44–1.00)
GFR, Estimated: >60 mL/min (ref >60)

## 2020-09-27 LAB — RESP PANEL BY RT-PCR (FLU A&B, COVID) ARPGX2
Influenza A by PCR: NEGATIVE
Influenza B by PCR: NEGATIVE
SARS Coronavirus 2 by RT PCR: NEGATIVE

## 2020-09-27 MED ORDER — ONDANSETRON HCL 4 MG PO TABS: 4.0000 mg | ORAL_TABLET | Freq: Four times a day (QID) | ORAL | Status: DC | PRN

## 2020-09-27 MED ORDER — SODIUM CHLORIDE 0.9 % IV SOLN
2.0000 g | Freq: Two times a day (BID) | INTRAVENOUS | Status: DC
  Administered 2020-09-28 – 2020-09-29 (×3): 2 g via INTRAVENOUS
  Filled 2020-09-27 (×5): qty 2

## 2020-09-27 MED ORDER — ALBUTEROL SULFATE (2.5 MG/3ML) 0.083% IN NEBU: 2.5000 mg | INHALATION_SOLUTION | Freq: Four times a day (QID) | RESPIRATORY_TRACT | Status: DC | PRN

## 2020-09-27 MED ORDER — ONDANSETRON HCL 4 MG/2ML IJ SOLN: 4.0000 mg | Freq: Four times a day (QID) | INTRAMUSCULAR | Status: DC | PRN

## 2020-09-27 MED ORDER — TEMAZEPAM 7.5 MG PO CAPS
7.5000 mg | ORAL_CAPSULE | Freq: Once | ORAL | Status: AC
  Administered 2020-09-27: 7.5 mg via ORAL
  Filled 2020-09-27: qty 1

## 2020-09-27 MED ORDER — IPRATROPIUM-ALBUTEROL 0.5-2.5 (3) MG/3ML IN SOLN
3.0000 mL | Freq: Three times a day (TID) | RESPIRATORY_TRACT | Status: DC
  Administered 2020-09-28: 3 mL via RESPIRATORY_TRACT
  Filled 2020-09-27: qty 3

## 2020-09-27 MED ORDER — IPRATROPIUM-ALBUTEROL 0.5-2.5 (3) MG/3ML IN SOLN
3.0000 mL | Freq: Four times a day (QID) | RESPIRATORY_TRACT | Status: DC
  Administered 2020-09-27 (×2): 3 mL via RESPIRATORY_TRACT
  Filled 2020-09-27: qty 3

## 2020-09-27 MED ORDER — GUAIFENESIN-DM 100-10 MG/5ML PO SYRP
5.0000 mL | ORAL_SOLUTION | ORAL | Status: DC | PRN
  Administered 2020-09-27 – 2020-09-28 (×4): 5 mL via ORAL
  Filled 2020-09-27 (×4): qty 5

## 2020-09-27 MED ORDER — LEVOTHYROXINE SODIUM 100 MCG PO TABS
100.0000 ug | ORAL_TABLET | Freq: Once | ORAL | Status: AC
  Administered 2020-09-28: 100 ug via ORAL
  Filled 2020-09-27: qty 1

## 2020-09-27 MED ORDER — SODIUM CHLORIDE 0.9 % IV SOLN: INTRAVENOUS | Status: DC | PRN

## 2020-09-27 MED ORDER — SODIUM CHLORIDE 0.9 % IV SOLN: 500.0000 mg | Freq: Once | INTRAVENOUS | Status: DC

## 2020-09-27 MED ORDER — GUAIFENESIN ER 600 MG PO TB12
600.0000 mg | ORAL_TABLET | Freq: Two times a day (BID) | ORAL | Status: DC
  Administered 2020-09-27 – 2020-09-29 (×4): 600 mg via ORAL
  Filled 2020-09-27 (×5): qty 1

## 2020-09-27 MED ORDER — SODIUM CHLORIDE 0.9 % IV SOLN
2.0000 g | Freq: Once | INTRAVENOUS | Status: AC
  Administered 2020-09-27: 2 g via INTRAVENOUS

## 2020-09-27 MED ORDER — ENOXAPARIN SODIUM 40 MG/0.4ML IJ SOSY
40.0000 mg | PREFILLED_SYRINGE | INTRAMUSCULAR | Status: DC
  Administered 2020-09-27 – 2020-09-28 (×2): 40 mg via SUBCUTANEOUS
  Filled 2020-09-27 (×2): qty 0.4

## 2020-09-27 MED ORDER — PANTOPRAZOLE SODIUM 40 MG PO TBEC
40.0000 mg | DELAYED_RELEASE_TABLET | Freq: Every day | ORAL | Status: DC
  Administered 2020-09-27 – 2020-09-29 (×3): 40 mg via ORAL
  Filled 2020-09-27 (×3): qty 1

## 2020-09-27 MED ORDER — IBUPROFEN 200 MG PO TABS: 400.0000 mg | ORAL_TABLET | Freq: Four times a day (QID) | ORAL | Status: DC | PRN

## 2020-09-27 MED ORDER — SODIUM CHLORIDE 0.9 % IV SOLN: 1.0000 g | Freq: Once | INTRAVENOUS | Status: DC

## 2020-09-27 NOTE — ED Triage Notes (Signed)
Patient c/o cough x 4 months.  Patient states that she has had 2 runs Prednisone and Z-pack, and Augmentin.

## 2020-09-27 NOTE — Progress Notes (Signed)
Pharmacy Antibiotic Note  Kayla [name] is a 70 y.o. female admitted on 09/27/2020 with pneumonia.  Pharmacy has been consulted for cefepime dosing.  Today, 09/27/20 WBC slightly elevated SCr WNL, CrCl ~55 mL/min Afebrile  Plan: Cefepime 2 g IV q12h Monitor renal function and culture data  Height: 5\' 3"  (160 cm) Weight: 56.7 kg (125 lb) IBW/kg (Calculated) : 52.4  Temp (24hrs), Avg:98.3 F (36.8 C), Min:98 F (36.7 C), Max:98.6 F (37 C)  Recent Labs  Lab 09/27/20 1049  WBC 11.6*  CREATININE 0.67    Estimated Creatinine Clearance: 55.7 mL/min (by C-G formula based on SCr of 0.67 mg/dL).    Allergies  Allergen Reactions   Tramadol     hallucinations    Contrast Media [Iodinated Diagnostic Agents] Rash   Iohexol Hives   Methylprednisolone Other (See Comments)    Reaction:  Decreases pts heart rate    Morphine And Related Other (See Comments)    Reaction:  Hallucinations    Oxycodone-Acetaminophen Rash and Other (See Comments)    Reaction:  Hallucinations    Phenytoin Sodium Extended Rash   Zanaflex [Tizanidine Hydrochloride] Rash    Antimicrobials this admission: cefepime 7/7 >>   Dose adjustments this admission:  Microbiology results: 7/7 BCx:   Thank you for allowing pharmacy to be a part of this patient's care.  Lenis Noon, PharmD 09/27/2020 2:06 PM

## 2020-09-27 NOTE — H&P (Signed)
History and Physical    Kayla [name] DOB: [birth year] DOA: 09/27/2020  PCP: Ronnald Nian, DO  Patient coming from: Home  Chief Complaint: Cough and fever  HPI: Kayla [name] is a 70 y.o. female with medical history significant of COPD not on chronic O2, multiple sclerosis, OA, hypothyroidism. Presenting with cough and fever. She reports that her symptoms started 3 to 4 months ago. She had a persistent productive cough. She has seen her pulmonologist multiple times during this span. She has had 2 rounds of steroids, a found to azithromycin, and finally a round of augmentin during this time. None of these treatments provided complete relief. It was recommended that she see the ENT. She visited an ENT at the end of May and was given a diagnosis of bilateral vocal fold paralysis. Observation was recommended. She reports that over the last 4 days, her symptoms have worsened to include fevers of up to 100.2. She decided to revisit her pulmonologist today. It was recommended that she come to the hospital for evaluation. She denies any other aggravating or alleviating factors.     ED Course: CT chest showed multifocal PNA. TRH was called for admission.   Review of Systems:  Denies CP, palpitations, abdominal pain, N/V/D. Reports subjective fever, cough. Review of systems is otherwise negative for all not mentioned in HPI.   PMHx Past Medical History:  Diagnosis Date   Arthritis    oa   Cervical cancer ([place]) 2005   COPD (chronic obstructive pulmonary disease) ([place])    Multiple sclerosis ([place])    Osteoporosis    Other diseases of vocal cords    vocal cords are not even   Other voice and resonance disorders    Pneumonia 2014, 1992   Sleep apnea    no cpap used   Unspecified hypothyroidism     PSHx Past Surgical History:  Procedure Laterality Date   ABDOMINAL HYSTERECTOMY  2005   for cervical cancer, complete   CONVERSION TO TOTAL HIP Right 09/18/2015   Procedure:  CONVERSION OF RIGHT HEMI ARTHROPLASTY TO TOTAL HIP ARTHROPLASTY ACETABULAR REVISON ;  Surgeon: Paralee Cancel, MD;  Location: [facility];  Service: Orthopedics;  Laterality: Right;   EYE SURGERY Bilateral    lens for cataracts   HIP ARTHROPLASTY Right 03/01/2013   Procedure: ARTHROPLASTY BIPOLAR HIP;  Surgeon: Mauri Pole, MD;  Location: [facility];  Service: Orthopedics;  Laterality: Right;   HIP CLOSED REDUCTION Right 03/12/2013   Procedure: CLOSED REDUCTION HIP;  Surgeon: Gearlean Alf, MD;  Location: [place];  Service: Orthopedics;  Laterality: Right;   HIP CLOSED REDUCTION Right 03/19/2013   Procedure: CLOSED REDUCTION HIP;  Surgeon: Johnn Hai, MD;  Location: [place];  Service: Orthopedics;  Laterality: Right;   HIP CLOSED REDUCTION Right 11/10/2015   Procedure: CLOSED MANIPULATION HIP;  Surgeon: Rod Can, MD;  Location: [facility];  Service: Orthopedics;  Laterality: Right;   HIP CLOSED REDUCTION Right 06/05/2017   Procedure: CLOSED REDUCTION HIP;  Surgeon: [name], MD;  Location: [facility];  Service: Orthopedics;  Laterality: Right;   TOTAL HIP REVISION Right 06/06/2017   Procedure: TOTAL HIP REVISION;  Surgeon: [name], MD;  Location: [facility];  Service: Orthopedics;  Laterality: Right;   VESICOVAGINAL FISTULA CLOSURE W/ TAH  2005    SocHx  reports that she has never smoked. She has never used smokeless tobacco. She reports current alcohol use of about 2.0 standard drinks of alcohol per week.  She reports that she does not use drugs.  Allergies  Allergen Reactions   Tramadol     hallucinations    Contrast Media [Iodinated Diagnostic Agents] Rash   Iohexol Hives   Methylprednisolone Other (See Comments)    Reaction:  Decreases pts heart rate    Morphine And Related Other (See Comments)    Reaction:  Hallucinations    Oxycodone-Acetaminophen Rash and Other (See Comments)    Reaction:  Hallucinations    Phenytoin Sodium Extended Rash   Zanaflex [Tizanidine Hydrochloride]  Rash    FamHx Family History  Problem Relation Age of Onset   Colon cancer Father     Prior to Admission medications   Medication Sig Start Date End Date Taking? Authorizing Provider  amoxicillin-clavulanate (AUGMENTIN) 875-125 MG tablet Take 1 tablet by mouth 2 (two) times daily. 08/02/20   Tanda Rockers, MD  amphetamine-dextroamphetamine (ADDERALL) 20 MG tablet Take 20 mg by mouth 2 (two) times daily.     [provider]  baclofen (LIORESAL) 20 MG tablet Take 20 mg by mouth 3 (three) times daily.     Love, Pamela S, PA-C  benzonatate (TESSALON) 200 MG capsule Take 1 capsule (200 mg total) by mouth 3 (three) times daily as needed for cough. 07/18/20   Tanda Rockers, MD  Calcium Carbonate-Vitamin D 600-400 MG-UNIT tablet Take 1 tablet by mouth daily.    [provider]  dalfampridine 10 MG TB12  08/17/19   [provider]  dexlansoprazole (DEXILANT) 60 MG capsule TAKE 1 CAPSULE BY MOUTH  ONCE DAILY WITH BREAKFAST 06/06/20   Tanda Rockers, MD  famotidine (PEPCID) 20 MG tablet One after supper 06/07/20   Tanda Rockers, MD  FLUoxetine (PROZAC) 40 MG capsule Take 40 mg by mouth daily.    [provider]  furosemide (LASIX) 20 MG tablet TAKE 1 TABLET BY MOUTH  DAILY 05/07/20   Tanda Rockers, MD  gabapentin (NEURONTIN) 600 MG tablet Take 1 tablet (600 mg total) by mouth 3 (three) times daily. 07/18/20   Tanda Rockers, MD  levothyroxine (SYNTHROID) 100 MCG tablet TAKE 1 TABLET BY MOUTH ONCE DAILY BEFORE BREAKFAST 12/07/19   Tanda Rockers, MD  magic mouthwash SOLN Take 15 mLs by mouth 4 (four) times daily. 08/10/20   Tanda Rockers, MD  mometasone-formoterol (DULERA) 100-5 MCG/ACT AERO USE 2 PUFFS EVERY 12 HOURS. 03/01/20   Tanda Rockers, MD  Multiple Vitamin (MULTIVITAMIN WITH MINERALS) TABS tablet Take 1 tablet by mouth daily.    [provider]  MYRBETRIQ 25 MG TB24 tablet Take 1 tablet by mouth daily. 08/03/18   [provider]   ocrelizumab (OCREVUS) 300 MG/10ML injection Inject 300 mg into the vein every 6 (six) months.     [provider]  Respiratory Therapy Supplies (FLUTTER) DEVI Use as directed 06/02/17   Tanda Rockers, MD  tamsulosin (FLOMAX) 0.4 MG CAPS capsule Take 0.4 mg by mouth.    [provider]  temazepam (RESTORIL) 15 MG capsule Take 15 mg by mouth at bedtime.    [provider]  VENTOLIN HFA 108 (90 Base) MCG/ACT inhaler INHALE 2 PUFFS INTO LUNGS EVERY 6 HOURS AS NEEDED. 07/16/17   Tanda Rockers, MD    Physical Exam: Vitals:   09/27/20 0944 09/27/20 0954 09/27/20 1154 09/27/20 1200  BP: (!) 131/58  (!) 120/59 124/61  Pulse: 90  72   Resp: 16  16   Temp: 98 F (36.7  C)     TempSrc: Oral     SpO2: 98%  92%   Weight:  56.7 kg    Height:  5\' 3"  (1.6 m)      General: 70 y.o. female resting in bed in NAD Eyes: PERRL, normal sclera ENMT: Nares patent w/o discharge, orophaynx clear, dentition normal, ears w/o discharge/lesions/ulcers Neck: Supple, trachea midline Cardiovascular: RRR, +S1, S2, no m/g/r, equal pulses throughout Respiratory: decreased at bases, somewhat breathless with speech GI: BS+, NDNT, no masses noted, no organomegaly noted MSK: No c/c; bl pedal edema R>L Skin: No rashes, bruises, ulcerations noted Neuro: A&O x 3, no focal deficits Psyc: Appropriate interaction and affect, calm/cooperative  Labs on Admission: I have personally reviewed following labs and imaging studies  CBC: Recent Labs  Lab 09/27/20 1049  WBC 11.6*  NEUTROABS 9.4*  HGB 10.5*  HCT 33.8*  MCV 88.0  PLT 681*   Basic Metabolic Panel: Recent Labs  Lab 09/27/20 1049  NA 138  K 4.0  CL 101  CO2 26  GLUCOSE 92  BUN 11  CREATININE 0.67  CALCIUM 9.2   GFR: Estimated Creatinine Clearance: 55.7 mL/min (by C-G formula based on SCr of 0.67 mg/dL). Liver Function Tests: No results for input(s): AST, ALT, ALKPHOS, BILITOT, PROT, ALBUMIN in the last 168 hours. No  results for input(s): LIPASE, AMYLASE in the last 168 hours. No results for input(s): AMMONIA in the last 168 hours. Coagulation Profile: No results for input(s): INR, PROTIME in the last 168 hours. Cardiac Enzymes: No results for input(s): CKTOTAL, CKMB, CKMBINDEX, TROPONINI in the last 168 hours. BNP (last 3 results) No results for input(s): PROBNP in the last 8760 hours. HbA1C: No results for input(s): HGBA1C in the last 72 hours. CBG: No results for input(s): GLUCAP in the last 168 hours. Lipid Profile: No results for input(s): CHOL, HDL, LDLCALC, TRIG, CHOLHDL, LDLDIRECT in the last 72 hours. Thyroid Function Tests: No results for input(s): TSH, T4TOTAL, FREET4, T3FREE, THYROIDAB in the last 72 hours. Anemia Panel: No results for input(s): VITAMINB12, FOLATE, FERRITIN, TIBC, IRON, RETICCTPCT in the last 72 hours. Urine analysis:    Component Value Date/Time   COLORURINE YELLOW 11/04/2018 [place] 11/04/2018 1647   LABSPEC 1.015 11/04/2018 1647   PHURINE 7.0 11/04/2018 1647   GLUCOSEU NEGATIVE 11/04/2018 1647   HGBUR NEGATIVE 11/04/2018 1647   BILIRUBINUR NEGATIVE 11/04/2018 1647   KETONESUR NEGATIVE 11/04/2018 1647   PROTEINUR NEGATIVE 11/07/2016 2359   UROBILINOGEN 0.2 11/04/2018 1647   NITRITE NEGATIVE 11/04/2018 [place] 11/04/2018 1647    Radiological Exams on Admission: DG Chest 2 View  Result Date: 09/27/2020 CLINICAL DATA:  Cough for the past 4 months. EXAM: CHEST - 2 VIEW COMPARISON:  Chest x-ray dated July 18, 2020. FINDINGS: The heart size and mediastinal contours are within normal limits. Normal pulmonary vascularity. New patchy somewhat nodular airspace disease in the left lower lobe. No pleural effusion or pneumothorax. Unchanged eventration of the right hemidiaphragm. No acute osseous abnormality. Old healed fracture of the right proximal humerus. IMPRESSION: 1. Patchy left lower lobe pneumonia. Electronically Signed   By:  Titus Dubin M.D.   On: 09/27/2020 11:09   CT Chest Wo Contrast  Result Date: 09/27/2020 CLINICAL DATA:  Cough for the past 4 months. EXAM: CT CHEST WITHOUT CONTRAST TECHNIQUE: Multidetector CT imaging of the chest was performed following the standard protocol without IV contrast. COMPARISON:  Chest x-ray from same day. CT chest dated April 10, 2019. FINDINGS: Cardiovascular: No significant vascular findings. Normal heart size. No pericardial effusion. No thoracic aortic aneurysm. Mild atherosclerotic calcification of the thoracic aorta. Mediastinum/Nodes: No enlarged mediastinal or axillary lymph nodes. Thyroid gland and esophagus demonstrate no significant findings. Lungs/Pleura: Small amount of layering secretions in the distal trachea. New peribronchovascular ground-glass densities with inter- and interlobular septal thickening in the right lower lobe (series 4, image 101). New more focal small nodular consolidations in the medial right lower lobe (series 4, images 81 and 93). Milder peribronchovascular nodular densities in the left lower lobe. New 14 x 10 mm nodular density in the medial right middle lobe (series 4, image 79). No pleural effusion or pneumothorax. Upper Abdomen: No acute abnormality. Unchanged small cyst in the central right liver. Musculoskeletal: No acute or significant osseous findings. Old healed fracture of the proximal sternal body, new since January 2021. IMPRESSION: 1. New peribronchovascular ground-glass densities with septal thickening in the right lower lobe and milder peribronchovascular nodular densities in the left lower lobe, concerning for multifocal atypical pneumonia, possibly aspiration related given small amount of layering secretions in the distal trachea. 2. New 14 x 10 mm nodular density in the medial right middle lobe is likely related to the above process,, but attention on follow-up imaging is recommended. 3. Aortic Atherosclerosis (ICD10-I70.0). Electronically  Signed   By: Titus Dubin M.D.   On: 09/27/2020 12:19    EKG: Independently reviewed. Normal sinus, no st elevations  Assessment/Plan Multifocal PNA     - place in obs, med-surg     - she not currently hypoxic; however, she is somewhat breathless with speech     - multifocal PNA noted on CT     - she's had steroids, abx outpt and has not improved     - let's give her cefepime for now; check an MRSA swab, if positive can add vanc     - check sputum cx; check urine legionella/strep     - COVID and flu are negative; she has no known exposures, no retest right now     - she does report w/ her MS she has difficulty swallowing from time to time, so we will have the SLP check her; maybe chronic aspiration happening and contributing to her problem; also add protonix     - She's not tachy and not hypoxic; hold on d-dimer for right now     - no known TB exposure and CT not suggestive     - if not improving on abx; consider pulm consult (she follows w/ Dr. Melvyn Novas)     - get her breathing tx and IS; no wheeze present, so will hold on steroids     - add guaifenesin  MS     - continue home regimen  Hypothyroidism     - continue synthroid  COPD     - not presenting as flare     - continue home regimen  Chronic BLE edema (R > L)     - she reports that her pedal edema is chronic; and her right foot is always worse than her left     - she denies any increase in her edema     - follow for now  DVT prophylaxis: lovenox  Code Status: FULL  Family Communication: None at bedside  Consults called: None   Status is: Observation  The patient remains OBS appropriate and will d/c before 2 midnights.  Dispo: The patient is from: Home  Anticipated d/c is to: Home              Patient currently is not medically stable to d/c.   Difficult to place patient No  [name] DO [facility]  If 7PM-7AM, please contact night-coverage [email or web address]  09/27/2020, 1:02 PM

## 2020-09-27 NOTE — Evaluation (Signed)
SLP Cancellation Note  Patient Details Name: Kayla [name] MRN: [id number] DOB: Aug 20, 1950   Cancelled treatment:       Reason Eval/Treat Not Completed: Other (comment) (Pt in bathroom at this time, will continue efforts.  Spouse present and reports frustration with it taking more than 3 hours for pt to receive an IV for ABX.  Will advise RN.)  Kathleen Lime, MS [place] Office [phone] Pager [phone]  Macario Golds 09/27/2020, 5:27 PM

## 2020-09-27 NOTE — ED Provider Notes (Signed)
[facility] DEPT Provider Note   CSN: [id number] Arrival date & time: 09/27/20  0931     History Chief Complaint  Patient presents with   Cough    NAQUITA NAPPIER is a 70 y.o. female.  70 year old female with prior medical history as detailed below presents for evaluation.  Reports significant prior history of COPD.  She complains today of persistent mildly productive cough for the last 4 months.  Symptoms worsened over the last 4 days. She reports increased dyspnea, low grade fevers, and chills. She was seen by Dr. Melvyn Novas this morning and sent for evaluation.  She reports that she has had several rounds of antibiotics including Z-Pak and Augmentin in last several weeks.  She is also been given prednisone.  Symptoms never really improved with any of these interventions.  The history is provided by medical records.  Cough Cough characteristics:  Productive Sputum characteristics:  Nondescript Severity:  Mild Onset quality:  Gradual Duration:  12 weeks Timing:  Constant Progression:  Waxing and waning Chronicity:  New     Past Medical History:  Diagnosis Date   Arthritis    oa   Cervical cancer ([place]) 2005   COPD (chronic obstructive pulmonary disease) ([place])    Multiple sclerosis (HCC)    Osteoporosis    Other diseases of vocal cords    vocal cords are not even   Other voice and resonance disorders    Pneumonia 2014, 1992   Sleep apnea    no cpap used   Unspecified hypothyroidism     Patient Active Problem List   Diagnosis Date Noted   Purple toe syndrome of both feet ([place]) 07/26/2020   Sternal fracture 09/08/2019   Chest wall pain 08/19/2019   Abrasion of leg with infection, right, subsequent encounter 04/27/2019   Ankle pain, right 08/11/2017   Hypotension 06/07/2017   Failed total hip arthroplasty with dislocation ([place]) 06/05/2017   Cough variant asthma vs vcd  related to MS  11/10/2016   Positional sleep apnea 09/10/2016    Periodic limb movements of sleep 09/10/2016   OSA on CPAP 02/08/2016   S/P revision right TH 09/18/2015   Leg swelling 08/21/2014   Acute URI 04/04/2014   Health care maintenance 12/16/2013   Hyperlipidemia 11/04/2013   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/21/2013   S/P hip hemiarthroplasty 03/15/2013   Dislocation of hip prosthesis ([place]) 03/12/2013   Hip dislocation, left ([place]) 03/12/2013   Hip dislocation, right ([place]) 03/12/2013   Urinary retention with incomplete bladder emptying 03/09/2013   Dysphagia, pharyngoesophageal phase 03/09/2013   Femoral neck fracture ([place]) 03/08/2013   Unspecified constipation 02/26/2013   Fractured femoral neck ([place]) 02/23/2013   Hip fracture ([place]) 02/23/2013   Excessive flatus 10/21/2012   Cold feet 07/29/2012   Hyponatremia 05/20/2012   Calf pain 11/17/2011   DOE (dyspnea on exertion) 12/06/2007   Post-menopausal osteoporosis 09/17/2007   Hypothyroidism 04/01/2007   Multiple sclerosis ([place]) 04/01/2007   Other diseases of vocal cords 04/01/2007    Past Surgical History:  Procedure Laterality Date   ABDOMINAL HYSTERECTOMY  2005   for cervical cancer, complete   CONVERSION TO TOTAL HIP Right 09/18/2015   Procedure: CONVERSION OF RIGHT HEMI ARTHROPLASTY TO TOTAL HIP ARTHROPLASTY ACETABULAR REVISON ;  Surgeon: Paralee Cancel, MD;  Location: [facility];  Service: Orthopedics;  Laterality: Right;   EYE SURGERY Bilateral    lens for cataracts   HIP ARTHROPLASTY Right 03/01/2013   Procedure: ARTHROPLASTY BIPOLAR HIP;  Surgeon: Rodman Key  Marian Sorrow, MD;  Location: [facility];  Service: Orthopedics;  Laterality: Right;   HIP CLOSED REDUCTION Right 03/12/2013   Procedure: CLOSED REDUCTION HIP;  Surgeon: Gearlean Alf, MD;  Location: [facility];  Service: Orthopedics;  Laterality: Right;   HIP CLOSED REDUCTION Right 03/19/2013   Procedure: CLOSED REDUCTION HIP;  Surgeon: Johnn Hai, MD;  Location: [facility];  Service: Orthopedics;  Laterality: Right;   HIP CLOSED REDUCTION Right 11/10/2015    Procedure: CLOSED MANIPULATION HIP;  Surgeon: Rod Can, MD;  Location: [facility];  Service: Orthopedics;  Laterality: Right;   HIP CLOSED REDUCTION Right 06/05/2017   Procedure: CLOSED REDUCTION HIP;  Surgeon: [name], MD;  Location: [facility];  Service: Orthopedics;  Laterality: Right;   TOTAL HIP REVISION Right 06/06/2017   Procedure: TOTAL HIP REVISION;  Surgeon: [name], MD;  Location: [facility];  Service: Orthopedics;  Laterality: Right;   VESICOVAGINAL FISTULA CLOSURE W/ TAH  2005     OB History   No obstetric history on file.     Family History  Problem Relation Age of Onset   Colon cancer Father     Social History   Tobacco Use   Smoking status: Never   Smokeless tobacco: Never  Vaping Use   Vaping Use: Never used  Substance Use Topics   Alcohol use: Yes    Alcohol/week: 2.0 standard drinks    Types: 1 Glasses of wine, 1 Cans of beer per week    Comment: 2x/week    Drug use: No    Home Medications Prior to Admission medications   Medication Sig Start Date End Date Taking? Authorizing Provider  amoxicillin-clavulanate (AUGMENTIN) 875-125 MG tablet Take 1 tablet by mouth 2 (two) times daily. 08/02/20   Tanda Rockers, MD  amphetamine-dextroamphetamine (ADDERALL) 20 MG tablet Take 20 mg by mouth 2 (two) times daily.     [provider]  baclofen (LIORESAL) 20 MG tablet Take 20 mg by mouth 3 (three) times daily.     Love, Pamela S, PA-C  benzonatate (TESSALON) 200 MG capsule Take 1 capsule (200 mg total) by mouth 3 (three) times daily as needed for cough. 07/18/20   Tanda Rockers, MD  Calcium Carbonate-Vitamin D 600-400 MG-UNIT tablet Take 1 tablet by mouth daily.    [provider]  dalfampridine 10 MG TB12  08/17/19   [provider]  dexlansoprazole (DEXILANT) 60 MG capsule TAKE 1 CAPSULE BY MOUTH  ONCE DAILY WITH BREAKFAST 06/06/20   Tanda Rockers, MD  famotidine (PEPCID) 20 MG tablet One after supper 06/07/20   Tanda Rockers, MD  FLUoxetine (PROZAC) 40 MG capsule Take 40 mg by mouth daily.    [provider]  furosemide (LASIX) 20 MG tablet TAKE 1 TABLET BY MOUTH  DAILY 05/07/20   Tanda Rockers, MD  gabapentin (NEURONTIN) 600 MG tablet Take 1 tablet (600 mg total) by mouth 3 (three) times daily. 07/18/20   Tanda Rockers, MD  levothyroxine (SYNTHROID) 100 MCG tablet TAKE 1 TABLET BY MOUTH ONCE DAILY BEFORE BREAKFAST 12/07/19   Tanda Rockers, MD  magic mouthwash SOLN Take 15 mLs by mouth 4 (four) times daily. 08/10/20   Tanda Rockers, MD  mometasone-formoterol (DULERA) 100-5 MCG/ACT AERO USE 2 PUFFS EVERY 12 HOURS. 03/01/20   Tanda Rockers, MD  Multiple Vitamin (MULTIVITAMIN WITH MINERALS) TABS tablet Take 1 tablet by mouth daily.    [provider]  MYRBETRIQ 25 MG  TB24 tablet Take 1 tablet by mouth daily. 08/03/18   [provider]  ocrelizumab (OCREVUS) 300 MG/10ML injection Inject 300 mg into the vein every 6 (six) months.     [provider]  Respiratory Therapy Supplies (FLUTTER) DEVI Use as directed 06/02/17   Tanda Rockers, MD  tamsulosin (FLOMAX) 0.4 MG CAPS capsule Take 0.4 mg by mouth.    [provider]  temazepam (RESTORIL) 15 MG capsule Take 15 mg by mouth at bedtime.    [provider]  VENTOLIN HFA 108 (90 Base) MCG/ACT inhaler INHALE 2 PUFFS INTO LUNGS EVERY 6 HOURS AS NEEDED. 07/16/17   Tanda Rockers, MD    Allergies    Tramadol, Contrast media [iodinated diagnostic agents], Iohexol, Methylprednisolone, Morphine and related, Oxycodone-acetaminophen, Phenytoin sodium extended, and Zanaflex [tizanidine hydrochloride]  Review of Systems   Review of Systems  Respiratory:  Positive for cough.   All other systems reviewed and are negative.  Physical Exam Updated Vital Signs BP (!) 131/58 (BP Location: Left Arm)   Pulse 90   Temp 98 F (36.7 C) (Oral)   Resp 16   Ht 5\' 3"  (1.6 m)   Wt 56.7 kg   SpO2 98%   BMI 22.14 kg/m   Physical  Exam Vitals and nursing note reviewed.  Constitutional:      General: She is not in acute distress.    Appearance: Normal appearance. She is well-developed.  HENT:     Head: Normocephalic and atraumatic.  Eyes:     Conjunctiva/sclera: Conjunctivae normal.     Pupils: Pupils are equal, round, and reactive to light.  Cardiovascular:     Rate and Rhythm: Normal rate and regular rhythm.     Heart sounds: Normal heart sounds.  Pulmonary:     Effort: Pulmonary effort is normal. No respiratory distress.     Breath sounds: Normal breath sounds.  Abdominal:     General: There is no distension.     Palpations: Abdomen is soft.     Tenderness: There is no abdominal tenderness.  Musculoskeletal:        General: No deformity. Normal range of motion.     Cervical back: Normal range of motion and neck supple.  Skin:    General: Skin is warm and dry.  Neurological:     General: No focal deficit present.     Mental Status: She is alert and oriented to person, place, and time.    ED Results / Procedures / Treatments   Labs (all labs ordered are listed, but only abnormal results are displayed) Labs Reviewed  CBC WITH DIFFERENTIAL/PLATELET - Abnormal; Notable for the following components:      Result Value   WBC 11.6 (*)    RBC 3.84 (*)    Hemoglobin 10.5 (*)    HCT 33.8 (*)    Platelets 453 (*)    Neutro Abs 9.4 (*)    Monocytes Absolute 1.1 (*)    All other components within normal limits  RESP PANEL BY RT-PCR (FLU A&B, COVID) ARPGX2  CULTURE, BLOOD (ROUTINE X 2)  CULTURE, BLOOD (ROUTINE X 2)  EXPECTORATED SPUTUM ASSESSMENT W GRAM STAIN, RFLX TO RESP C  BASIC METABOLIC PANEL  LACTIC ACID, PLASMA  LACTIC ACID, PLASMA    EKG None  Radiology DG Chest 2 View  Result Date: 09/27/2020 CLINICAL DATA:  Cough for the past 4 months. EXAM: CHEST - 2 VIEW COMPARISON:  Chest x-ray dated July 18, 2020. FINDINGS: The  heart size and mediastinal contours are within normal limits. Normal  pulmonary vascularity. New patchy somewhat nodular airspace disease in the left lower lobe. No pleural effusion or pneumothorax. Unchanged eventration of the right hemidiaphragm. No acute osseous abnormality. Old healed fracture of the right proximal humerus. IMPRESSION: 1. Patchy left lower lobe pneumonia. Electronically Signed   By: Titus Dubin M.D.   On: 09/27/2020 11:09   CT Chest Wo Contrast  Result Date: 09/27/2020 CLINICAL DATA:  Cough for the past 4 months. EXAM: CT CHEST WITHOUT CONTRAST TECHNIQUE: Multidetector CT imaging of the chest was performed following the standard protocol without IV contrast. COMPARISON:  Chest x-ray from same day. CT chest dated April 10, 2019. FINDINGS: Cardiovascular: No significant vascular findings. Normal heart size. No pericardial effusion. No thoracic aortic aneurysm. Mild atherosclerotic calcification of the thoracic aorta. Mediastinum/Nodes: No enlarged mediastinal or axillary lymph nodes. Thyroid gland and esophagus demonstrate no significant findings. Lungs/Pleura: Small amount of layering secretions in the distal trachea. New peribronchovascular ground-glass densities with inter- and interlobular septal thickening in the right lower lobe (series 4, image 101). New more focal small nodular consolidations in the medial right lower lobe (series 4, images 81 and 93). Milder peribronchovascular nodular densities in the left lower lobe. New 14 x 10 mm nodular density in the medial right middle lobe (series 4, image 79). No pleural effusion or pneumothorax. Upper Abdomen: No acute abnormality. Unchanged small cyst in the central right liver. Musculoskeletal: No acute or significant osseous findings. Old healed fracture of the proximal sternal body, new since January 2021. IMPRESSION: 1. New peribronchovascular ground-glass densities with septal thickening in the right lower lobe and milder peribronchovascular nodular densities in the left lower lobe, concerning for  multifocal atypical pneumonia, possibly aspiration related given small amount of layering secretions in the distal trachea. 2. New 14 x 10 mm nodular density in the medial right middle lobe is likely related to the above process,, but attention on follow-up imaging is recommended. 3. Aortic Atherosclerosis (ICD10-I70.0). Electronically Signed   By: Titus Dubin M.D.   On: 09/27/2020 12:19    Procedures Procedures   Medications Ordered in ED Medications - No data to display  ED Course  I have reviewed the triage vital signs and the nursing notes.  Pertinent labs & imaging results that were available during my care of the patient were reviewed by me and considered in my medical decision making (see chart for details).    MDM Rules/Calculators/A&P                          MDM  MSE complete  MARGARET COCKERILL was evaluated in Emergency Department on 09/27/2020 for the symptoms described in the history of present illness. She was evaluated in the context of the global COVID-19 pandemic, which necessitated consideration that the patient might be at risk for infection with the SARS-CoV-2 virus that causes COVID-19. Institutional protocols and algorithms that pertain to the evaluation of patients at risk for COVID-19 are in a state of rapid change based on information released by regulatory bodies including the CDC and federal and state organizations. These policies and algorithms were followed during the patient's care in the ED.   Patient is presenting with complaint of persistent productive cough.  Symptoms of worsened over the last several days.  She does have lungs and history of COPD.  Work-up today is suggestive of multifocal pneumonia.  Patient would likely benefit from admission  for further work-up and treatment.  Hospitalist service is aware of case and will evaluate for same.  Final Clinical Impression(s) / ED Diagnoses Final diagnoses:  Community acquired pneumonia, unspecified  laterality    Rx / DC Orders ED Discharge Orders     None        Valarie Merino, MD 09/27/20 1258

## 2020-09-27 NOTE — Progress Notes (Deleted)
Subjective:    Patient ID: Kayla [name], female    DOB: July 13, 1950   MRN: [id number]   Brief patient profile:  33 yowf never smoker with MS since Oct 96 all neuro care through Dr [name] in [facility]),   [name] at [facility] urology,  [place] [facility]  Followed here for Primary care/hypothyroid     History of Present Illness  October 04, 2009 Followup with PFT's. Pt states that her breathing is no better since last seen June 2011 for acute visit. She states she is still wheezing- ventolin helps and is using this up to tid. w/u c/w anemia  Mild dysphagia no more than usual. occ waking up wheezing at night despite zegerid at hs.   rec  Try  use dulera 100 2 bid prn   September 18 2015  R THR [name]    10/01/2015  f/u ov/Kayla [name] re: sob s/p et on dulera 100 2bid and dexilant q am  Chief Complaint  Patient presents with   Acute Visit    Pt c/o increased DOE since had hip replacement 09/18/15. She states she gets SOB with just talking, showering or walking short distances.   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   back in CPAP as of July 2017  > no change in symptoms but wakes her up a lot / f/u per Dr [name] planned  rec See Dr [name] for all issues related to your cpap machine but it appears to be working fine by your download today  No change in medications and recs     08/08/2016  f/u ov/Kayla [name] re: multiple concerns 1) check thyroid - clinically no symptoms 2) feet stay cold/ blue with neg vascular w/u to date 3) wants referral to sports medicine as needs stay hyperextended due to MS/  no problem with falling just using cane Rec Please see patient coordinator before you leave today  to schedule referal to sports medicine and vascular surgery Please remember to go to the lab department downstairs in the basement  for your tests - we will call you with the results when they are available. Please schedule a follow up visit in 3 months but call sooner if needed     11/10/2016  f/u ov/Kayla [name] re: hyponatremia  Chief Complaint  Patient presents with   Follow-up    recent visit to ED- 11/07/16 for hyponatremia- sodium level was 123.   Bladder dysfunction / foley dep since 11/04/16 > f/u [place] Knee hyperext better with support  Hyponatremia/ no symptoms on  Lasix daily  cpap d/c 'd 09/03/2016 with RLS not bothersome to her > neurology f/u  Asthma vs vcd    dulera 100 2bid / hfa with some sense of globus but no cough or increased sob/ noct symptoms or need for saba  rec Plan A = Automatic = dulera 100 2 pffs each am and then only take  the pm the dose if you are not doing great and/or needing your rescue inhaler  Work on inhaler technique:      Plan B = Backup Only use your albuterol (ventolin) as a rescue medication   Drink gatorade if you need to push more fluids for any reason       12/20/2019  f/u ov/Kayla [name] re:  Vcd related to [place] / AB/ hypothyroid/ Tendency to mild hyponatremia  Chief Complaint  Patient presents with   Follow-up    Patient has been doing good since last visit, no concerns   Dyspnea:  50 ft / no longer doing much shopping other than [employer]  Cough: none Sleeping: no resp issues / one pillow  SABA use: none while dulera 100 2bid  02: none  rec No change rx   06/07/2020  Acute  ov/Kayla [name] re: new cough x 2 weeks / rx bactrim started 2 d prior to OV  due to uti per urology  Chief Complaint  Patient presents with   Acute Visit    Cough x 2 wks- worse evening and night- prod with yellow sputum.   Dyspnea:  No change Cough: worse p supper but does not  keep her up noct  /ok in am x nose stopped up too   Sleeping: bed blocks/ 1 pillow SABA use: has not used  02: none  Covid status:   vax x 3  rec Whenever cough or upper airway symptoms worsen, add pepcid 20 mg after supper until better for at least 2 weeks  For cough > delsym 2 tsp every 12 hours as needed     07/09/20 PC ? Asthma flare : Prednisone 10 mg take  4 each am x 2 days,   2 each am x 2 days,  1 each am x 2 days and stop  zpak > helps some    07/18/2020  f/u ov/Kayla [name] re: cough with prominent UACS from VC paralysis from [place] Chief Complaint  Patient presents with   Follow-up    Cough is some better, worse at night.Productive white  Dyspnea:  Rolling walker ok  Cough: worse at hs / mucoid / bed blocks  SABA use: has it, not using  02: none  Covid status:   moderna x 2 / never infected  Rec Prednisone 10 mg take  4 each am x 2 days,   2 each am x 2 days,  1 each am x 2 days and stop  Increase gabapentin to 600 mg three times daily  For cough try tessalon 200 mg every 6 hours as needed    09/27/2020 acute  ov/Kayla [name] re: cough worse  No chief complaint on file.   Dyspnea:  *** Cough: *** Sleeping: *** SABA use: *** 02: *** Covid status:   ***   No obvious day to day or daytime variability or assoc excess/ purulent sputum or mucus plugs or hemoptysis or cp or chest tightness, subjective wheeze or overt sinus or hb symptoms.   *** without nocturnal  or early am exacerbation  of respiratory  c/o's or need for noct saba. Also denies any obvious fluctuation of symptoms with weather or environmental changes or other aggravating or alleviating factors except as outlined above   No unusual exposure hx or h/o childhood pna/ asthma or knowledge of premature birth.  Current Allergies, Complete Past Medical History, Past Surgical History, Family History, and Social History were reviewed in [employer] record.  ROS  The following  are not active complaints  unless bolded Hoarseness, sore throat, dysphagia, dental problems, itching, sneezing,  nasal congestion or discharge of excess mucus or purulent secretions, ear ache,   fever, chills, sweats, unintended wt loss or wt gain, classically pleuritic or exertional cp,  orthopnea pnd or arm/hand swelling  or leg swelling, presyncope, palpitations, abdominal pain, anorexia, nausea, vomiting, diarrhea  or change in bowel habits or change in bladder habits, change in stools or change in urine, dysuria, hematuria,  rash, arthralgias, visual complaints, headache, numbness, weakness or ataxia or problems with walking or coordination,  change in mood or  memory.        No outpatient medications have been marked as taking for the 09/27/20 encounter (Appointment) with Tanda Rockers, MD.                           Past Medical History:  MULTIPLE SCLEROSIS (ICD-340)........................Marland Kitchen Dr Effie Sh[facility], was at [facility])  - dx 12/1994  Sleep Apnea.....................................................Marland Kitchen        Dr Melida Quitter       - CPAP rx 04/2010 HYPOTHYROIDISM (ICD-244.9)  OTHER DISEASES OF VOCAL CORDS (ICD-478.5)  Cervical Cancer > complete hysterectomy...........Marland Kitchen [name] HOARSENESS (ICD-784.49).................................  [place]  Bladder dysfunction ...............................................Marland Kitchen  [place]od[place]  Dyspnea/wheeze ? asthma  - HFA 50 % after coaching October 04, 2009 >  50% 06/25/2010  - PFT's October 04, 2009 FEV1 2.15 (95%) with ratio 69 and DLCO 58> corrects to 98, mild insp truncation  Health Maintenance.............................................Marland KitchenWert  - Pneumovax March 28, 2009 and Prevnar 13 11/06/2015 and pneumovax 04/26/2019  - Td  04/26/2019  - CPX 11/06/2015        Objective:   Physical Exam    09/27/2020    ***  07/18/2020   120 06/07/2020   126 12/20/2019  122 04/26/2019    126  08/26/2018    122 Wt  118 > 127 March 28, 2009 >129 June 17 > 128 October 04, 2009 > 129 06/25/2010 >125 10/29/2010 > 128 11/26/2010 >>127 12/10/2010 > 11/17/2011  129> 126 05/20/2012 > 07/29/2012  129 > 10/20/2012  129 >  135 06/21/2013 >  11/04/2013 135 >134 04/04/2014 > 04/18/2014 135 >  07/17/2014 132> 08/17/2014  135 > 12/20/2014 > 01/17/2015 134   > 08/08/2015   126 >  10/01/2015   130 > 11/06/2015   130 > 11/10/2016  123 > 02/10/2017  133  > 05/14/2017   129 > 08/11/2017  125 > 11/24/2017  127 >  02/23/2018  127 >  03/29/2018   128     Vital signs reviewed  09/27/2020  - Note at rest 02 sats  ***% on ***   General appearance:    ***       venous stasis changes both LE's                            Assessment & Plan:

## 2020-09-28 DIAGNOSIS — Z8701 Personal history of pneumonia (recurrent): Secondary | ICD-10-CM | POA: Diagnosis not present

## 2020-09-28 DIAGNOSIS — J3802 Paralysis of vocal cords and larynx, bilateral: Secondary | ICD-10-CM | POA: Diagnosis present

## 2020-09-28 DIAGNOSIS — Z885 Allergy status to narcotic agent status: Secondary | ICD-10-CM | POA: Diagnosis not present

## 2020-09-28 DIAGNOSIS — Z90710 Acquired absence of both cervix and uterus: Secondary | ICD-10-CM | POA: Diagnosis not present

## 2020-09-28 DIAGNOSIS — Z91041 Radiographic dye allergy status: Secondary | ICD-10-CM | POA: Diagnosis not present

## 2020-09-28 DIAGNOSIS — K449 Diaphragmatic hernia without obstruction or gangrene: Secondary | ICD-10-CM | POA: Diagnosis present

## 2020-09-28 DIAGNOSIS — Z7951 Long term (current) use of inhaled steroids: Secondary | ICD-10-CM | POA: Diagnosis not present

## 2020-09-28 DIAGNOSIS — G4733 Obstructive sleep apnea (adult) (pediatric): Secondary | ICD-10-CM | POA: Diagnosis present

## 2020-09-28 DIAGNOSIS — E785 Hyperlipidemia, unspecified: Secondary | ICD-10-CM | POA: Diagnosis present

## 2020-09-28 DIAGNOSIS — Z20822 Contact with and (suspected) exposure to covid-19: Secondary | ICD-10-CM | POA: Diagnosis present

## 2020-09-28 DIAGNOSIS — R1314 Dysphagia, pharyngoesophageal phase: Secondary | ICD-10-CM | POA: Diagnosis present

## 2020-09-28 DIAGNOSIS — Z79899 Other long term (current) drug therapy: Secondary | ICD-10-CM | POA: Diagnosis not present

## 2020-09-28 DIAGNOSIS — G35 Multiple sclerosis: Secondary | ICD-10-CM | POA: Diagnosis present

## 2020-09-28 DIAGNOSIS — M1990 Unspecified osteoarthritis, unspecified site: Secondary | ICD-10-CM | POA: Diagnosis present

## 2020-09-28 DIAGNOSIS — Z8541 Personal history of malignant neoplasm of cervix uteri: Secondary | ICD-10-CM | POA: Diagnosis not present

## 2020-09-28 DIAGNOSIS — M810 Age-related osteoporosis without current pathological fracture: Secondary | ICD-10-CM | POA: Diagnosis present

## 2020-09-28 DIAGNOSIS — E039 Hypothyroidism, unspecified: Secondary | ICD-10-CM | POA: Diagnosis present

## 2020-09-28 DIAGNOSIS — Z888 Allergy status to other drugs, medicaments and biological substances status: Secondary | ICD-10-CM | POA: Diagnosis not present

## 2020-09-28 DIAGNOSIS — K219 Gastro-esophageal reflux disease without esophagitis: Secondary | ICD-10-CM | POA: Diagnosis present

## 2020-09-28 DIAGNOSIS — R918 Other nonspecific abnormal finding of lung field: Secondary | ICD-10-CM | POA: Diagnosis not present

## 2020-09-28 DIAGNOSIS — J449 Chronic obstructive pulmonary disease, unspecified: Secondary | ICD-10-CM | POA: Diagnosis present

## 2020-09-28 DIAGNOSIS — R600 Localized edema: Secondary | ICD-10-CM | POA: Diagnosis present

## 2020-09-28 DIAGNOSIS — J189 Pneumonia, unspecified organism: Secondary | ICD-10-CM

## 2020-09-28 DIAGNOSIS — J690 Pneumonitis due to inhalation of food and vomit: Secondary | ICD-10-CM | POA: Diagnosis present

## 2020-09-28 DIAGNOSIS — Z79890 Hormone replacement therapy (postmenopausal): Secondary | ICD-10-CM | POA: Diagnosis not present

## 2020-09-28 DIAGNOSIS — Z96641 Presence of right artificial hip joint: Secondary | ICD-10-CM | POA: Diagnosis present

## 2020-09-28 LAB — COMPREHENSIVE METABOLIC PANEL
ALT: 13 U/L (ref 0–44)
AST: 16 U/L (ref 15–41)
Albumin: 3.3 g/dL — ABNORMAL LOW (ref 3.5–5.0)
Alkaline Phosphatase: 63 U/L (ref 38–126)
Anion gap: 8 (ref 5–15)
BUN: 11 mg/dL (ref 8–23)
CO2: 26 mmol/L (ref 22–32)
Calcium: 9 mg/dL (ref 8.9–10.3)
Chloride: 101 mmol/L (ref 98–111)
Creatinine, Ser: 0.68 mg/dL (ref 0.44–1.00)
GFR, Estimated: >60 mL/min (ref >60)
Glucose, Bld: 102 mg/dL — ABNORMAL HIGH (ref 70–99)
Potassium: 3.9 mmol/L (ref 3.5–5.1)
Sodium: 135 mmol/L (ref 135–145)
Total Bilirubin: 0.4 mg/dL (ref 0.3–1.2)
Total Protein: 5.9 g/dL — ABNORMAL LOW (ref 6.5–8.1)

## 2020-09-28 LAB — STREP PNEUMONIAE URINARY ANTIGEN: Strep Pneumo Urinary Antigen: NEGATIVE

## 2020-09-28 LAB — CBC
HCT: 30.9 % — ABNORMAL LOW (ref 36.0–46.0)
Hemoglobin: 9.7 g/dL — ABNORMAL LOW (ref 12.0–15.0)
MCH: 27.6 pg (ref 26.0–34.0)
MCHC: 31.4 g/dL (ref 30.0–36.0)
MCV: 88 fL (ref 80.0–100.0)
Platelets: 406 10*3/uL — ABNORMAL HIGH (ref 150–400)
RBC: 3.51 MIL/uL — ABNORMAL LOW (ref 3.87–5.11)
RDW: 14.4 % (ref 11.5–15.5)
WBC: 9 10*3/uL (ref 4.0–10.5)
nRBC: 0 % (ref 0.0–0.2)

## 2020-09-28 LAB — HIV ANTIBODY (ROUTINE TESTING W REFLEX): HIV Screen 4th Generation wRfx: NONREACTIVE

## 2020-09-28 LAB — PROCALCITONIN: Procalcitonin: <0.1 ng/mL

## 2020-09-28 LAB — EXPECTORATED SPUTUM ASSESSMENT W GRAM STAIN, RFLX TO RESP C

## 2020-09-28 MED ORDER — TEMAZEPAM 15 MG PO CAPS
15.0000 mg | ORAL_CAPSULE | Freq: Once | ORAL | Status: AC
  Administered 2020-09-28: 15 mg via ORAL
  Filled 2020-09-28: qty 1

## 2020-09-28 MED ORDER — IPRATROPIUM-ALBUTEROL 0.5-2.5 (3) MG/3ML IN SOLN
3.0000 mL | Freq: Two times a day (BID) | RESPIRATORY_TRACT | Status: DC
  Administered 2020-09-28 – 2020-09-29 (×2): 3 mL via RESPIRATORY_TRACT
  Filled 2020-09-28 (×2): qty 3

## 2020-09-28 MED ORDER — BENZONATATE 100 MG PO CAPS
100.0000 mg | ORAL_CAPSULE | Freq: Three times a day (TID) | ORAL | Status: DC | PRN
  Administered 2020-09-28 – 2020-09-29 (×2): 200 mg via ORAL
  Filled 2020-09-28 (×2): qty 2

## 2020-09-28 NOTE — Evaluation (Signed)
Clinical/Bedside Swallow Evaluation Patient Details  Name: Kayla [name] MRN: [id number] Date of Birth: 09/28/1950  Today's Date: 09/28/2020 Time: SLP Start Time (ACUTE ONLY): 2355 SLP Stop Time (ACUTE ONLY): 1800 SLP Time Calculation (min) (ACUTE ONLY): 40 min  Past Medical History:  Past Medical History:  Diagnosis Date   Arthritis    oa   Cervical cancer ([place]) 2005   COPD (chronic obstructive pulmonary disease) ([place])    Multiple sclerosis ([place])    Osteoporosis    Other diseases of vocal cords    vocal cords are not even   Other voice and resonance disorders    Pneumonia 2014, 1992   Sleep apnea    no cpap used   Unspecified hypothyroidism    Past Surgical History:  Past Surgical History:  Procedure Laterality Date   ABDOMINAL HYSTERECTOMY  2005   for cervical cancer, complete   CONVERSION TO TOTAL HIP Right 09/18/2015   Procedure: CONVERSION OF RIGHT HEMI ARTHROPLASTY TO TOTAL HIP ARTHROPLASTY ACETABULAR REVISON ;  Surgeon: Paralee Cancel, MD;  [facility];  Service: Orthopedics;  Laterality: Right;   EYE SURGERY Bilateral    lens for cataracts   HIP ARTHROPLASTY Right 03/01/2013   Procedure: ARTHROPLASTY BIPOLAR HIP;  Surgeon: Mauri Pole, MD;  [facility];  Service: Orthopedics;  Laterality: Right;   HIP CLOSED REDUCTION Right 03/12/2013   Procedure: CLOSED REDUCTION HIP;  Surgeon: Gearlean Alf, MD;  Location: [place];  Service: Orthopedics;  Laterality: Right;   HIP CLOSED REDUCTION Right 03/19/2013   Procedure: CLOSED REDUCTION HIP;  Surgeon: Johnn Hai, MD;  Location: [place];  Service: Orthopedics;  Laterality: Right;   HIP CLOSED REDUCTION Right 11/10/2015   Procedure: CLOSED MANIPULATION HIP;  Surgeon: Rod Can, MD;  [facility];  Service: Orthopedics;  Laterality: Right;   HIP CLOSED REDUCTION Right 06/05/2017   Procedure: CLOSED REDUCTION HIP;  Surgeon: [name], MD;  [facility];  Service: Orthopedics;  Laterality: Right;    TOTAL HIP REVISION Right 06/06/2017   Procedure: TOTAL HIP REVISION;  Surgeon: [name], MD;  [facility];  Service: Orthopedics;  Laterality: Right;   VESICOVAGINAL FISTULA CLOSURE W/ TAH  2005   HPI:  70 yo female with h/o COPD, MS, OA, hypothyroidism, GERD, bilateral vocal fold paralysis (potentially due to MS per ENT note) admitted to [facility] with persistent cough and fever - cough reported to be ongoing for 3-4 months.  PT had unilateral vocal fold paralysis documented as far back as 2015.  Pt reports she was treated with ABX, Zpac and Prednisne, but as soon as she stopped the medications, the cough recurred.  Pt CT of her chest is concerning for multifocal atypical pna - possibly aspiration given small amount of layering secretions in the distal trachea.  Pt is followed by Dr [name], ENT; Dr Melvyn Novas, Pulmonology and Dr Fuller Plan GI.  Pt also with h/o having undergone esophageal manometry - first documentation in EPIC being 2001 showing some increased pressure at UES. She takes Dexilant currently for her reflux.  Pt denies overt difficulty with swallowing. Admits to occasionally coughing up a pill - not accompanied by frothy secretions.  Prior MBS x2 conducted 2014/12 after pt had suffered a fall and needed CIR stay.  Pt needed to tuck her chin with thin after first study, but this was not required after 2nd.  Minimal penetration of thin and mild UES retention - cleared with dry swallows noted. Pt was placed  on reg/thin diet.   Assessment / Plan / Recommendation Clinical Impression  Patient presents with no clinical indications of aspiration immediately after swallowing. Pt passed 3 ounce Yale water challenge, Delayed cough noted several minutes AFTER minimal intake - which pt verbalized was not due to her pna.  She has h/o GERD and bilateral vocal fold paralysis, stridor heard upon exhalation, which pt states is normal for her.  Reviewed pt's prior swallowing testing reports including manometry, MBSs  x2, endoscopy, etc with her.  Pt without indication of dysphagia with intake of graham cracker, applesauce, and approximately 5 ounces of water total.  Of note, upon walking into pt's room, she was drinking water with HOB lowered to less than 30*, advised she only consume po when upright for improved oral control, airway protection.  Recommend pt follow up with GI given imaging concerning for aspiration due to secretions layered in trachea.  Provided pt with compensation strategies for esophageal reflux.  Pt advised she is scheduled for colonoscopy with her GI MD at the end of August- requested she call him if esophageal concerns are present.  Messaged attending who advised he will assure GI is arranged as OP for this pt. All education completed and no follow up indicated.  Thank you. SLP Visit Diagnosis: Dysphagia, unspecified (R13.10) (suspect potential pharyngoesophageal/UES due to MS)    Aspiration Risk  Mild aspiration risk    Diet Recommendation Regular;Thin liquid   Liquid Administration via: Cup;Straw Medication Administration: Whole meds with liquid Supervision: Patient able to self feed Compensations: Slow rate;Small sips/bites Postural Changes: Seated upright at 90 degrees;Remain upright for at least 30 minutes after po intake    Other  Recommendations   Follow up with GI as an OP  Follow up Recommendations None      Frequency and Duration   N/A         Prognosis    N/A    Swallow Study   General Date of Onset: 09/28/20 HPI: 70 yo female with h/o COPD, MS, OA, hypothyroidism, GERD, bilateral vocal fold paralysis (potentially due to MS per ENT note) admitted to [facility] with persistent cough and fever - cough reported to be ongoing for 3-4 months.  PT had unilateral vocal fold paralysis documented as far back as 2015.  Pt reports she was treated with ABX, Zpac and Prednisne, but as soon as she stopped the medications, the cough recurred.  Pt CT of her chest is concerning for  multifocal atypical pna - possibly aspiration given small amount of layering secretions in the distal trachea.  Pt is followed by Dr [name], ENT; Dr Melvyn Novas, Pulmonology and Dr Fuller Plan GI.  Pt also with h/o having undergone esophageal manometry - first documentation in EPIC being 2001 showing some increased pressure at UES. She takes Dexilant currently for her reflux.  Pt denies overt difficulty with swallowing. Admits to occasionally coughing up a pill - not accompanied by frothy secretions.  Prior MBS x2 conducted 2014/12 after pt had suffered a fall and needed CIR stay.  Pt needed to tuck her chin with thin after first study, but this was not required after 2nd.  Minimal penetration of thin and mild UES retention - cleared with dry swallows noted. Pt was placed on reg/thin diet. Type of Study: Bedside Swallow Evaluation Diet Prior to this Study: Regular;Thin liquids Temperature Spikes Noted: No Respiratory Status: Room air History of Recent Intubation: No Behavior/Cognition: Alert;Cooperative;Pleasant mood Oral Cavity Assessment: Within Functional Limits Oral Care Completed by SLP: No  Oral Cavity - Dentition: Adequate natural dentition Vision: Functional for self-feeding Self-Feeding Abilities: Able to feed self Patient Positioning: Upright in bed Baseline Vocal Quality: Hoarse;Other (comment) (appears with inspiratory stridor which pt states is normal) Volitional Cough: Strong Volitional Swallow: Able to elicit    Oral/Motor/Sensory Function Overall Oral Motor/Sensory Function: Within functional limits   Ice Chips Ice chips: Not tested   Thin Liquid Thin Liquid: Within functional limits Presentation: Cup;Straw Other Comments: Pt passed 3 ounce Yale water challenge, Delayed cough noted after minimal intake - has h/o GERD.    Nectar Thick Nectar Thick Liquid: Not tested   Honey Thick Honey Thick Liquid: Not tested   Puree Puree: Within functional limits Presentation: Self Fed;Spoon    Solid     Solid: Within functional limits Presentation: Self Fed;Spoon      Macario Golds 09/28/2020,7:54 PM  Kathleen Lime, MS [facility] SLP [place] Office [phone] Pager [phone]

## 2020-09-28 NOTE — Consult Note (Signed)
NAME:  Kayla [name], MRN:  [id number], DOB:  05-02-1950, LOS: 0 ADMISSION DATE:  09/27/2020, CONSULTATION DATE:  7/8 REFERRING MD:  Dr. Bonner Puna, CHIEF COMPLAINT:  Cough/Abnormal CT   History of Present Illness:  70 year old female with PMH as below, which is significant for MS and vocal fold paralysis followed by ENT. She is also followed by Dr. Melvyn Novas for obstructive disease. She developed cough in approximately March of this year. Cough is described as productive with yellow sputum. She denies fevers for most of the course of her illness. She has seen Dr. Melvyn Novas for this and has been treated with two separate courses of antibiotics (azithromycin, augmentin) and two courses of steroids. She briefly felt relief after the Augmentin, but it was short lived. On 7/4 she began to experience fever, which persisted, and ultimately caused her presentation to [facility]. Workup in the ED included chest X-ray with left lower lobe infiltrate. CT was done to better characterize, and was concerning from multifocal pneumonia, and possible aspiration.  PCCM was consulted for further evaluation.   Pertinent  Medical History   has a past medical history of Arthritis, Cervical cancer ([place]) (2005), COPD (chronic obstructive pulmonary disease) ([place]), Multiple sclerosis ([place]), Osteoporosis, Other diseases of vocal cords, Other voice and resonance disorders, Pneumonia (2014, 1992), Sleep apnea, and Unspecified hypothyroidism.   Significant Hospital Events: Including procedures, antibiotic start and stop dates in addition to other pertinent events     Interim History / Subjective:    Objective   Blood pressure (!) 105/54, pulse 71, temperature 98.9 F (37.2 C), resp. rate 16, height 5\' 3"  (1.6 m), weight 56.7 kg, SpO2 98 %.        Intake/Output Summary (Last 24 hours) at 09/28/2020 1434 Last data filed at 09/28/2020 0900 Gross per 24 hour  Intake 563.52 ml  Output 75 ml  Net 488.52 ml   Filed Weights    09/27/20 0954  Weight: 56.7 kg    Examination: General: Adult female of normal body habitus in NAD HENT: [place]/AT, PERRL, no JVD Lungs: Clear bilateral breath sounds. No distress. Occasional upper airway stridor, which is chronic per patient.  Cardiovascular: RRR, no MRG Abdomen: Soft, non-tender, non-distended Extremities: No edema Neuro: Alert, oriented, non-focal  Resolved Hospital Problem list     Assessment & Plan:   Community acquired pneumonia: which has been refractory to outpatient antibiotic treatment. There is concern for aspiration on CT as well. Given her history and self-reported swallowing issues, chronic aspiration could cause this appearance on CT.  - Continue empiric antibiotics with Cefepime - Will be interested to see what SLP evaluation elicits. - Agree with cultures and viral panel.   - Incentive spirometry  Asthma/COPD without acute exacerbation.  - Duoneb in lieu of home dulera - Defer steroids  Best Practice (right click and "Reselect all SmartList Selections" daily)   Diet/type: Regular consistency (see orders) DVT prophylaxis: LMWH GI prophylaxis: PPI Lines: N/A Foley:  N/A Code Status:  full code Last date of multidisciplinary goals of care discussion [ ]   Labs   CBC: Recent Labs  Lab 09/27/20 1049 09/27/20 1534 09/28/20 0512  WBC 11.6* 11.3* 9.0  NEUTROABS 9.4*  --   --   HGB 10.5* 10.2* 9.7*  HCT 33.8* 32.2* 30.9*  MCV 88.0 88.7 88.0  PLT 453* 377 406*    Basic Metabolic Panel: Recent Labs  Lab 09/27/20 1049 09/27/20 1534 09/28/20 0512  NA 138  --  135  K 4.0  --  3.9  CL 101  --  101  CO2 26  --  26  GLUCOSE 92  --  102*  BUN 11  --  11  CREATININE 0.67 0.67 0.68  CALCIUM 9.2  --  9.0   GFR: Estimated Creatinine Clearance: 55.7 mL/min (by C-G formula based on SCr of 0.68 mg/dL). Recent Labs  Lab 09/27/20 1049 09/27/20 1314 09/27/20 1531 09/27/20 1534 09/28/20 0512  PROCALCITON  --   --   --   --  <0.10  WBC  11.6*  --   --  11.3* 9.0  LATICACIDVEN  --  1.2 2.0*  --   --     Liver Function Tests: Recent Labs  Lab 09/28/20 0512  AST 16  ALT 13  ALKPHOS 63  BILITOT 0.4  PROT 5.9*  ALBUMIN 3.3*   No results for input(s): LIPASE, AMYLASE in the last 168 hours. No results for input(s): AMMONIA in the last 168 hours.  ABG    Component Value Date/Time   PHART 7.437 02/26/2013 1953   PCO2ART 35.9 02/26/2013 1953   PO2ART 41.9 (L) 02/26/2013 1953   HCO3 24.1 (H) 02/26/2013 1953   TCO2 26 01/07/2017 1617   ACIDBASEDEF 5.3 (H) 02/24/2013 1625   O2SAT 81.7 02/26/2013 1953     Coagulation Profile: No results for input(s): INR, PROTIME in the last 168 hours.  Cardiac Enzymes: No results for input(s): CKTOTAL, CKMB, CKMBINDEX, TROPONINI in the last 168 hours.  HbA1C: Hgb A1c MFr Bld  Date/Time Value Ref Range Status  02/25/2013 08:35 AM 5.6 <5.7 % Final    Comment:    (NOTE)                                                                       According to the ADA Clinical Practice Recommendations for 2011, when HbA1c is used as a screening test:  >=6.5%   Diagnostic of Diabetes Mellitus           (if abnormal result is confirmed) 5.7-6.4%   Increased risk of developing Diabetes Mellitus References:Diagnosis and Classification of Diabetes Mellitus,Diabetes JOAC,1660,63(KZSWF 1):S62-S69 and Standards of Medical Care in         Diabetes - 2011,Diabetes UXNA,3557,32 (Suppl 1):S11-S61.    CBG: No results for input(s): GLUCAP in the last 168 hours.  Review of Systems:   Bolds are positive  Constitutional: weight loss, gain, night sweats, Fevers, chills, fatigue .  HEENT: headaches, Sore throat, sneezing, nasal congestion, post nasal drip, Difficulty swallowing, Tooth/dental problems, visual complaints visual changes, ear ache CV:  chest pain, radiates:,Orthopnea, PND, swelling in lower extremities, dizziness, palpitations, syncope.  GI  heartburn, indigestion, abdominal pain,  nausea, vomiting, diarrhea, change in bowel habits, loss of appetite, bloody stools.  Resp: cough, productive: , hemoptysis, dyspnea, chest pain, pleuritic.  Skin: rash or itching or icterus GU: dysuria, change in color of urine, urgency or frequency. flank pain, hematuria  MS: joint pain or swelling. decreased range of motion  Psych: change in mood or affect. depression or anxiety.  Neuro: difficulty with speech, weakness, numbness, ataxia    Past Medical History:  She,  has a past medical history of Arthritis, Cervical cancer ([place]) (2005), COPD (chronic obstructive  pulmonary disease) ([place]), Multiple sclerosis ([place]), Osteoporosis, Other diseases of vocal cords, Other voice and resonance disorders, Pneumonia (2014, 1992), Sleep apnea, and Unspecified hypothyroidism.   Surgical History:   Past Surgical History:  Procedure Laterality Date   ABDOMINAL HYSTERECTOMY  2005   for cervical cancer, complete   CONVERSION TO TOTAL HIP Right 09/18/2015   Procedure: CONVERSION OF RIGHT HEMI ARTHROPLASTY TO TOTAL HIP ARTHROPLASTY ACETABULAR REVISON ;  Surgeon: Paralee Cancel, MD;  [facility];  Service: Orthopedics;  Laterality: Right;   EYE SURGERY Bilateral    lens for cataracts   HIP ARTHROPLASTY Right 03/01/2013   Procedure: ARTHROPLASTY BIPOLAR HIP;  Surgeon: Mauri Pole, MD;  [facility];  Service: Orthopedics;  Laterality: Right;   HIP CLOSED REDUCTION Right 03/12/2013   Procedure: CLOSED REDUCTION HIP;  Surgeon: Gearlean Alf, MD;  Location: [place];  Service: Orthopedics;  Laterality: Right;   HIP CLOSED REDUCTION Right 03/19/2013   Procedure: CLOSED REDUCTION HIP;  Surgeon: Johnn Hai, MD;  Location: [place];  Service: Orthopedics;  Laterality: Right;   HIP CLOSED REDUCTION Right 11/10/2015   Procedure: CLOSED MANIPULATION HIP;  Surgeon: Rod Can, MD;  [facility];  Service: Orthopedics;  Laterality: Right;   HIP CLOSED REDUCTION Right 06/05/2017   Procedure: CLOSED  REDUCTION HIP;  Surgeon: [name], MD;  [facility];  Service: Orthopedics;  Laterality: Right;   TOTAL HIP REVISION Right 06/06/2017   Procedure: TOTAL HIP REVISION;  Surgeon: [name], MD;  [facility];  Service: Orthopedics;  Laterality: Right;   VESICOVAGINAL FISTULA CLOSURE W/ TAH  2005     Social History:   reports that she has never smoked. She has never used smokeless tobacco. She reports current alcohol use of about 2.0 standard drinks of alcohol per week. She reports that she does not use drugs.   Family History:  Her family history includes Colon cancer in her father.   Allergies Allergies  Allergen Reactions   Tramadol     hallucinations    Contrast Media [Iodinated Diagnostic Agents] Rash   Iohexol Hives   Methylprednisolone Other (See Comments)    Reaction:  Decreases pts heart rate    Morphine And Related Other (See Comments)    Reaction:  Hallucinations    Oxycodone-Acetaminophen Rash and Other (See Comments)    Reaction:  Hallucinations    Phenytoin Sodium Extended Rash   Zanaflex [Tizanidine Hydrochloride] Rash     Home Medications  Prior to Admission medications   Medication Sig Start Date End Date Taking? Authorizing Provider  acetaminophen (TYLENOL) 500 MG tablet Take 1,000 mg by mouth every 6 (six) hours as needed for mild pain, fever or headache.   Yes [provider]  amphetamine-dextroamphetamine (ADDERALL) 20 MG tablet Take 20 mg by mouth daily.   Yes [provider]  baclofen (LIORESAL) 20 MG tablet Take 20 mg by mouth 3 (three) times daily.    Yes Love, Ivan Anchors, PA-C  CALCIUM PO Take 1 tablet by mouth daily.   Yes [provider]  dalfampridine 10 MG TB12 Take 10 mg by mouth 2 (two) times daily. 08/17/19  Yes [provider]  dexlansoprazole (DEXILANT) 60 MG capsule TAKE 1 CAPSULE BY MOUTH  ONCE DAILY WITH BREAKFAST Patient taking differently: Take 60 mg by mouth daily. 06/06/20  Yes Tanda Rockers, MD  famotidine (PEPCID) 20 MG tablet One after supper Patient taking differently: Take 20 mg by mouth daily. 06/07/20  Yes Tanda Rockers, MD  FLUoxetine (PROZAC) 40 MG capsule Take 40 mg by mouth daily.   Yes [provider]  furosemide (LASIX) 20 MG tablet TAKE 1 TABLET BY MOUTH  DAILY Patient taking differently: Take 20 mg by mouth daily. 05/07/20  Yes Tanda Rockers, MD  gabapentin (NEURONTIN) 600 MG tablet Take 1 tablet (600 mg total) by mouth 3 (three) times daily. Patient taking differently: Take 600 mg by mouth daily. 07/18/20  Yes Tanda Rockers, MD  levothyroxine (SYNTHROID) 100 MCG tablet TAKE 1 TABLET BY MOUTH ONCE DAILY BEFORE BREAKFAST Patient taking differently: Take 100 mcg by mouth daily before breakfast. 12/07/19  Yes Tanda Rockers, MD  mometasone-formoterol (DULERA) 100-5 MCG/ACT AERO USE 2 PUFFS EVERY 12 HOURS. Patient taking differently: Inhale 2 puffs into the lungs every 12 (twelve) hours. 03/01/20  Yes Tanda Rockers, MD  Multiple Vitamin (MULTIVITAMIN WITH MINERALS) TABS tablet Take 1 tablet by mouth daily.   Yes [provider]  MYRBETRIQ 25 MG TB24 tablet Take 25 mg by mouth daily. 08/03/18  Yes [provider]  ocrelizumab (OCREVUS) 300 MG/10ML injection Inject 300 mg into the vein every 6 (six) months.    Yes [provider]  pilocarpine (SALAGEN) 7.5 MG tablet Take 7.5 mg by mouth 3 (three) times daily. 09/04/20  Yes [provider]  Probiotic Product (PROBIOTIC PO) Take 1 capsule by mouth daily. Harmony probiotic   Yes [provider]  tamsulosin (FLOMAX) 0.4 MG CAPS capsule Take 0.4 mg by mouth daily.   Yes [provider]  temazepam (RESTORIL) 15 MG capsule Take 15 mg by mouth at bedtime.   Yes [provider]  amoxicillin-clavulanate (AUGMENTIN) 875-125 MG tablet Take 1 tablet by mouth 2 (two) times daily. Patient not taking: Reported on 09/27/2020 08/02/20   Tanda Rockers, MD   benzonatate (TESSALON) 200 MG capsule Take 1 capsule (200 mg total) by mouth 3 (three) times daily as needed for cough. Patient not taking: Reported on 09/27/2020 07/18/20   Tanda Rockers, MD  magic mouthwash SOLN Take 15 mLs by mouth 4 (four) times daily. Patient not taking: Reported on 09/27/2020 08/10/20   Tanda Rockers, MD  Respiratory Therapy Supplies (FLUTTER) DEVI Use as directed 06/02/17   Tanda Rockers, MD     Critical care time:      Georgann Housekeeper, AGACNP-BC [place] for personal pager PCCM on call pager [phone] until 7pm. Please call Elink 7p-7a. [phone]  09/28/2020 3:58 PM

## 2020-09-28 NOTE — Progress Notes (Signed)
PROGRESS NOTE  Kayla [name]  PYP:[id number] DOB: 1951-02-22 DOA: 09/27/2020 PCP: Ronnald Nian, DO  Outpatient Specialists: Dr. Gwenlyn Perking Pulmonary Brief Narrative: Kayla [name] is a 70 y.o. female with medical history significant of COPD not on chronic O2, multiple sclerosis, OA, hypothyroidism. Presenting with cough and fever. She reports that her symptoms started 3 to 4 months ago. She had a persistent productive cough. She has seen her pulmonologist multiple times during this span. She has had 2 rounds of steroids, a found to azithromycin, and finally a round of augmentin during this time. None of these treatments provided complete relief. It was recommended that she see the ENT. She visited an ENT at the end of May and was given a diagnosis of bilateral vocal fold paralysis. Observation was recommended. She reports that over the last 4 days, her symptoms have worsened to include fevers of up to 100.2. She decided to revisit her pulmonologist today. It was recommended that she come to the hospital for evaluation to include covid testing which was negative.      ED Course: CT chest showed multifocal PNA. TRH was called for admission. Pulmonary has been consulted for assistance with work up and management.   Assessment & Plan: Active Problems:   Multifocal pneumonia  Multifocal pneumonia: Having failed several rounds of outpatient management.  - Continue IV cefepime having not achieved cure with augmentin or azithromycin. MRSA PCR neg, covid and flu neg, pneumococcal Ag neg. Check PCT.  - Follow up sputum culture and blood cultures - Check full respiratory virus panel - Speech therapy evaluation requested - Patient feels no better at this time, is well established patient with Dr. Melvyn Novas. She will certainly benefit from formal pulmonary evaluation. I appreciate any assistance PCCM can offer.  - Guaifenesin to be continued, add tessalon due to severe coughing. Has allergy to opioids  and specifically morphine, so will avoid narcotic-containing cough syrup.  Vocal cord dysfunction: - Follow up with ENT.  - This is clearly concomitant but not primary issue at this time.   MS: Longstanding history. Near her recent functional baseline.  COPD: No wheezing.  - Continue current medication. Defer use of steroids to PCCM.   Hypothyroidism: Recent TSH wnl.  - Continue synthroid.   DVT prophylaxis: Lovenox Code Status: Full Family Communication: Husband at bedside Disposition Plan:  Status is: Observation  The patient will require care spanning > 2 midnights and should be moved to inpatient because: Ongoing diagnostic testing needed not appropriate for outpatient work up and continued IV antibiotics having failed outpatient treatment  Dispo: The patient is from: Home              Anticipated d/c is to: Home              Patient currently is not medically stable to d/c.   Difficult to place patient No  Consultants:  PCCM  Procedures:  None  Antimicrobials: Cefepime   Subjective: Cough remains persistent, severe, hacking and associated with dyspnea. No chest pain reported.   Objective: Vitals:   09/28/20 0232 09/28/20 0532 09/28/20 0745 09/28/20 1330  BP: (!) 101/47 (!) 120/53  (!) 105/54  Pulse: 67 60  71  Resp: 20 18  16   Temp: 98.4 F (36.9 C) (!) 97.3 F (36.3 C)  98.9 F (37.2 C)  TempSrc:      SpO2: 98% 97% 97% 98%  Weight:      Height:  Intake/Output Summary (Last 24 hours) at 09/28/2020 1414 Last data filed at 09/28/2020 0900 Gross per 24 hour  Intake 563.52 ml  Output 75 ml  Net 488.52 ml   Filed Weights   09/27/20 0954  Weight: 56.7 kg    Gen: 70 y.o. female in no distress  Pulm: Non-labored but tachypneic, crackles noted without wheezes.  CV: Regular rate and rhythm. No murmur, rub, or gallop. No JVD, 1+ R > L pedal edema. GI: Abdomen soft, non-tender, non-distended, with normoactive bowel sounds. No organomegaly or masses  felt. Ext: Warm, no deformities Skin: No rashes, lesions or ulcers on visualized skin Neuro: Alert and oriented. Right foot drop and diffuse LE weakness without other focal neurological deficits. Psych: Judgement and insight appear normal. Mood & affect appropriate.   Data Reviewed: I have personally reviewed following labs and imaging studies  CBC: Recent Labs  Lab 09/27/20 1049 09/27/20 1534 09/28/20 0512  WBC 11.6* 11.3* 9.0  NEUTROABS 9.4*  --   --   HGB 10.5* 10.2* 9.7*  HCT 33.8* 32.2* 30.9*  MCV 88.0 88.7 88.0  PLT 453* 377 119*   Basic Metabolic Panel: Recent Labs  Lab 09/27/20 1049 09/27/20 1534 09/28/20 0512  NA 138  --  135  K 4.0  --  3.9  CL 101  --  101  CO2 26  --  26  GLUCOSE 92  --  102*  BUN 11  --  11  CREATININE 0.67 0.67 0.68  CALCIUM 9.2  --  9.0   GFR: Estimated Creatinine Clearance: 55.7 mL/min (by C-G formula based on SCr of 0.68 mg/dL). Liver Function Tests: Recent Labs  Lab 09/28/20 0512  AST 16  ALT 13  ALKPHOS 63  BILITOT 0.4  PROT 5.9*  ALBUMIN 3.3*   No results for input(s): LIPASE, AMYLASE in the last 168 hours. No results for input(s): AMMONIA in the last 168 hours. Coagulation Profile: No results for input(s): INR, PROTIME in the last 168 hours. Cardiac Enzymes: No results for input(s): CKTOTAL, CKMB, CKMBINDEX, TROPONINI in the last 168 hours. BNP (last 3 results) No results for input(s): PROBNP in the last 8760 hours. HbA1C: No results for input(s): HGBA1C in the last 72 hours. CBG: No results for input(s): GLUCAP in the last 168 hours. Lipid Profile: No results for input(s): CHOL, HDL, LDLCALC, TRIG, CHOLHDL, LDLDIRECT in the last 72 hours. Thyroid Function Tests: No results for input(s): TSH, T4TOTAL, FREET4, T3FREE, THYROIDAB in the last 72 hours. Anemia Panel: No results for input(s): VITAMINB12, FOLATE, FERRITIN, TIBC, IRON, RETICCTPCT in the last 72 hours. Urine analysis:    Component Value Date/Time    COLORURINE YELLOW 11/04/2018 [place] 11/04/2018 1647   LABSPEC 1.015 11/04/2018 1647   PHURINE 7.0 11/04/2018 1647   GLUCOSEU NEGATIVE 11/04/2018 1647   HGBUR NEGATIVE 11/04/2018 1647   BILIRUBINUR NEGATIVE 11/04/2018 1647   KETONESUR NEGATIVE 11/04/2018 1647   PROTEINUR NEGATIVE 11/07/2016 2359   UROBILINOGEN 0.2 11/04/2018 1647   NITRITE NEGATIVE 11/04/2018 1647   LEUKOCYTESUR NEGATIVE 11/04/2018 1647   Recent Results (from the past 240 hour(s))  Resp Panel by RT-PCR (Flu A&B, Covid) Nasopharyngeal Swab     Status: None   Collection Time: 09/27/20 10:49 AM   Specimen: Nasopharyngeal Swab; Nasopharyngeal(NP) swabs in vial transport medium  Result Value Ref Range Status   SARS Coronavirus 2 by RT PCR NEGATIVE NEGATIVE Final    Comment: (NOTE) SARS-CoV-2 target nucleic acids are NOT DETECTED.  The SARS-CoV-2  RNA is generally detectable in upper respiratory specimens during the acute phase of infection. The lowest concentration of SARS-CoV-2 viral copies this assay can detect is 138 copies/mL. A negative result does not preclude SARS-Cov-2 infection and should not be used as the sole basis for treatment or other patient management decisions. A negative result may occur with  improper specimen collection/handling, submission of specimen other than nasopharyngeal swab, presence of viral mutation(s) within the areas targeted by this assay, and inadequate number of viral copies(<138 copies/mL). A negative result must be combined with clinical observations, patient history, and epidemiological information. The expected result is Negative.  Fact Sheet for Patients:  EntrepreneurPulse.com.au  Fact Sheet for Healthcare Providers:  IncredibleEmployment.be  This test is no t yet approved or cleared by the [place] FDA and  has been authorized for detection and/or diagnosis of SARS-CoV-2 by FDA under an Emergency Use  Authorization (EUA). This EUA will remain  in effect (meaning this test can be used) for the duration of the COVID-19 declaration under Section 564(b)(1) of the Act, 21 U.S.C.section 360bbb-3(b)(1), unless the authorization is terminated  or revoked sooner.       Influenza A by PCR NEGATIVE NEGATIVE Final   Influenza B by PCR NEGATIVE NEGATIVE Final    Comment: (NOTE) The Xpert Xpress SARS-CoV-2/FLU/RSV plus assay is intended as an aid in the diagnosis of influenza from Nasopharyngeal swab specimens and should not be used as a sole basis for treatment. Nasal washings and aspirates are unacceptable for Xpert Xpress SARS-CoV-2/FLU/RSV testing.  Fact Sheet for Patients: EntrepreneurPulse.com.au  Fact Sheet for Healthcare Providers: IncredibleEmployment.be  This test is not yet approved or cleared by the [place] FDA and has been authorized for detection and/or diagnosis of SARS-CoV-2 by FDA under an Emergency Use Authorization (EUA). This EUA will remain in effect (meaning this test can be used) for the duration of the COVID-19 declaration under Section 564(b)(1) of the Act, 21 U.S.C. section 360bbb-3(b)(1), unless the authorization is terminated or revoked.  Performed at [facility], [place] [address] [place], [place] [address]   Culture, blood (routine x 2)     Status: None (Preliminary result)   Collection Time: 09/27/20  1:13 PM   Specimen: BLOOD RIGHT WRIST  Result Value Ref Range Status   Specimen Description   Final    BLOOD RIGHT WRIST Performed at [place] [address], [place], [place] [address]    Special Requests   Final    BOTTLES DRAWN AEROBIC AND ANAEROBIC Blood Culture adequate volume Performed at [facility] [address] [place], [place] [address]    Culture   Final    NO GROWTH < 24 HOURS Performed at [facility] [address]  [place], [place] [address]    Report Status PENDING  Incomplete  Culture, blood (routine x 2)     Status: None (Preliminary result)   Collection Time: 09/27/20  1:14 PM   Specimen: BLOOD  Result Value Ref Range Status   Specimen Description   Final    BLOOD LEFT ANTECUBITAL Performed at [facility] [address], [place], [place] [address]    Special Requests   Final    BOTTLES DRAWN AEROBIC AND ANAEROBIC Blood Culture adequate volume Performed at [facility] [address] [place], [place] [address]    Culture   Final    NO GROWTH < 24 HOURS Performed at [facility], [address] [place], [place] [address]    Report Status PENDING  Incomplete  MRSA Next Gen by PCR, Nasal     Status: None   Collection Time: 09/27/20  9:00 PM   Specimen: Nasal Mucosa; Nasal Swab  Result Value Ref Range Status   MRSA by PCR Next Gen NOT DETECTED NOT DETECTED Final    Comment: (NOTE) The GeneXpert MRSA Assay (FDA approved for NASAL specimens only), is one component of a comprehensive MRSA colonization surveillance program. It is not intended to diagnose MRSA infection nor to guide or monitor treatment for MRSA infections. Test performance is not FDA approved in patients less than 37 years old. Performed at [facility], [place] [address], [place], [place] [address]   Expectorated Sputum Assessment w Gram Stain, Rflx to Resp Cult     Status: None   Collection Time: 09/28/20 12:30 PM   Specimen: Sputum  Result Value Ref Range Status   Specimen Description SPUTUM  Final   Special Requests NONE  Final   Sputum evaluation   Final    THIS SPECIMEN IS ACCEPTABLE FOR SPUTUM CULTURE Performed at [facility], [place] [address], [place],  [address]    Report Status 09/28/2020 FINAL  Final      Radiology Studies: DG Chest 2 View  Result Date: 09/27/2020 CLINICAL DATA:  Cough for the past 4 months. EXAM: CHEST - 2  VIEW COMPARISON:  Chest x-ray dated July 18, 2020. FINDINGS: The heart size and mediastinal contours are within normal limits. Normal pulmonary vascularity. New patchy somewhat nodular airspace disease in the left lower lobe. No pleural effusion or pneumothorax. Unchanged eventration of the right hemidiaphragm. No acute osseous abnormality. Old healed fracture of the right proximal humerus. IMPRESSION: 1. Patchy left lower lobe pneumonia. Electronically Signed   By: Titus Dubin M.D.   On: 09/27/2020 11:09   CT Chest Wo Contrast  Result Date: 09/27/2020 CLINICAL DATA:  Cough for the past 4 months. EXAM: CT CHEST WITHOUT CONTRAST TECHNIQUE: Multidetector CT imaging of the chest was performed following the standard protocol without IV contrast. COMPARISON:  Chest x-ray from same day. CT chest dated April 10, 2019. FINDINGS: Cardiovascular: No significant vascular findings. Normal heart size. No pericardial effusion. No thoracic aortic aneurysm. Mild atherosclerotic calcification of the thoracic aorta. Mediastinum/Nodes: No enlarged mediastinal or axillary lymph nodes. Thyroid gland and esophagus demonstrate no significant findings. Lungs/Pleura: Small amount of layering secretions in the distal trachea. New peribronchovascular ground-glass densities with inter- and interlobular septal thickening in the right lower lobe (series 4, image 101). New more focal small nodular consolidations in the medial right lower lobe (series 4, images 81 and 93). Milder peribronchovascular nodular densities in the left lower lobe. New 14 x 10 mm nodular density in the medial right middle lobe (series 4, image 79). No pleural effusion or pneumothorax. Upper Abdomen: No acute abnormality. Unchanged small cyst in the central right liver. Musculoskeletal: No acute or significant osseous findings. Old healed fracture of the proximal sternal body, new since January 2021. IMPRESSION: 1. New peribronchovascular ground-glass densities  with septal thickening in the right lower lobe and milder peribronchovascular nodular densities in the left lower lobe, concerning for multifocal atypical pneumonia, possibly aspiration related given small amount of layering secretions in the distal trachea. 2. New 14 x 10 mm nodular density in the medial right middle lobe is likely related to the above process,, but attention on follow-up imaging is recommended. 3. Aortic  Atherosclerosis (ICD10-I70.0). Electronically Signed   By: Titus Dubin M.D.   On: 09/27/2020 12:19    Scheduled Meds:  enoxaparin (LOVENOX) injection  40 mg Subcutaneous Q24H   guaiFENesin  600 mg Oral BID   ipratropium-albuterol  3 mL Nebulization BID   pantoprazole  40 mg Oral Daily   Continuous Infusions:  sodium chloride     ceFEPime (MAXIPIME) IV 2 g (09/28/20 0533)     LOS: 0 days   Time spent: 35 minutes.  Patrecia Pour, MD [facility] [email or web address] 09/28/2020, 2:14 PM

## 2020-09-29 LAB — RESPIRATORY PANEL BY PCR

## 2020-09-29 LAB — BLOOD GAS, ARTERIAL
Acid-Base Excess: 0.9 mmol/L (ref 0.0–2.0)
Bicarbonate: 24.2 mmol/L (ref 20.0–28.0)
FIO2: 21
O2 Saturation: 96.4 %
Patient temperature: 98.6
pCO2 arterial: 35.4 mmHg (ref 32.0–48.0)
pH, Arterial: 7.45 (ref 7.350–7.450)
pO2, Arterial: 77.6 mmHg — ABNORMAL LOW (ref 83.0–108.0)

## 2020-09-29 LAB — SEDIMENTATION RATE: Sed Rate: 44 mm/hr — ABNORMAL HIGH (ref 0–22)

## 2020-09-29 NOTE — Discharge Summary (Signed)
Physician Discharge Summary  [name]:[id number] DOB: April 13, 1950 DOA: 09/27/2020  PCP: Ronnald Nian, DO  Admit date: 09/27/2020 Discharge date: 09/29/2020  Admitted From: Home Disposition: Home   Recommendations for Outpatient Follow-up:  Follow up with PCP in 1-2 weeks Follow up with [facility]. BOOP Serologies are pending at discharge. No antibiotics or steroids were felt to be indicated at discharge.  Follow up with GI, Dr. Fuller Plan. Recommend an esophagram as outpatient +/- EGD.   Home Health: None Equipment/Devices: None new Discharge Condition: Stable CODE STATUS: Full Diet recommendation: Regular, aspiration precautions  Brief/Interim Summary: Kayla [name] is a 70 y.o. female with medical history significant of COPD not on chronic O2, multiple sclerosis, OA, hypothyroidism. Presenting with cough and fever. She reports that her symptoms started 3 to 4 months ago. She had a persistent productive cough. She has seen her pulmonologist multiple times during this span. She has had 2 rounds of steroids, a found to azithromycin, and finally a round of augmentin during this time. None of these treatments provided complete relief. It was recommended that she see the ENT. She visited an ENT at the end of May and was given a diagnosis of bilateral vocal fold paralysis. Observation was recommended. She reports that over the last 4 days, her symptoms have worsened to include fevers of up to 100.2. She decided to revisit her pulmonologist today. It was recommended that she come to the hospital for evaluation to include covid testing which was negative.      ED Course: CT chest showed multifocal PNA. TRH was called for admission. Pulmonary has been consulted for assistance with work up and management.   Hospital Course: PCCM consulted. PCT was negative (<0.10), respiratory viral panel was negative. SLP felt the patient was at some risk of aspiration, though not actively aspirating.  The cause for the patient's protracted cough and intermittent worsening is felt to be aspiration pneumonitis, recurrent. She has remained with improved respiratory effort, afebrile, with no hypoxia and has been cleared for discharge with some serologies pending. Plan to follow up with pulmonology and with GI.   Discharge Diagnoses:  Active Problems:   Multifocal pneumonia   Pulmonary infiltrate present on computed tomography  Multifocal pneumonia ruled out, more likely recurrent aspiration pneumonitis: Having failed several rounds of outpatient management.  - Received IV cefepime. Abx not to be continued at discharge since procalcitonin is negative, MRSA PCR neg, covid and flu neg, pneumococcal Ag neg. Respiratory viral panel is also negative. Blood cultures NGTD at discharge.  - Aspiration precautions reviewed.    Vocal cord dysfunction: - Follow up with ENT. - This is clearly concomitant but not primary issue at this time.   MS: Longstanding history. Near her recent functional baseline.   COPD: No wheezing. - Continue current medication.   Hypothyroidism: Recent TSH wnl. - Continue synthroid.   GERD, hiatal hernia, increased UES pressure by manometry 2001:  - Recommend esophagram +/- EGD.    Discharge Instructions Discharge Instructions     Discharge instructions   Complete by: As directed    Your work up revealed opacities in the lungs that are thought to be related to chronic/recurrent aspiration which can cause aspiration pneumonia and pneumonitis (inflammation of the lungs without infection). The full work up strongly indicates there is not actual infection, so aspiration pneumonitis fits the most with the time course and symptom history. You do not require any further inpatient treatment nor continuation of antibiotics at this time.  Please follow up with Dr. Fuller Plan for esophageal dysmotility, consideration for EGD, and seek medical attention if your symptoms worsen. Continue  employing strategies to minimize risk of aspiration as discussed by speech therapy.      Allergies as of 09/29/2020       Reactions   Tramadol    hallucinations    Contrast Media [iodinated Diagnostic Agents] Rash   Iohexol Hives   Methylprednisolone Other (See Comments)   Reaction:  Decreases pts heart rate    Morphine And Related Other (See Comments)   Reaction:  Hallucinations    Oxycodone-acetaminophen Rash, Other (See Comments)   Reaction:  Hallucinations    Phenytoin Sodium Extended Rash   Zanaflex [tizanidine Hydrochloride] Rash        Medication List     STOP taking these medications    amoxicillin-clavulanate 875-125 MG tablet Commonly known as: AUGMENTIN   benzonatate 200 MG capsule Commonly known as: TESSALON   magic mouthwash Soln       TAKE these medications    acetaminophen 500 MG tablet Commonly known as: TYLENOL Take 1,000 mg by mouth every 6 (six) hours as needed for mild pain, fever or headache.   amphetamine-dextroamphetamine 20 MG tablet Commonly known as: ADDERALL Take 20 mg by mouth daily.   baclofen 20 MG tablet Commonly known as: LIORESAL Take 20 mg by mouth 3 (three) times daily.   CALCIUM PO Take 1 tablet by mouth daily.   dalfampridine 10 MG Tb12 Take 10 mg by mouth 2 (two) times daily.   dexlansoprazole 60 MG capsule Commonly known as: Dexilant TAKE 1 CAPSULE BY MOUTH  ONCE DAILY WITH BREAKFAST What changed:  how much to take how to take this when to take this additional instructions   Dulera 100-5 MCG/ACT Aero Generic drug: mometasone-formoterol USE 2 PUFFS EVERY 12 HOURS. What changed:  how much to take how to take this when to take this additional instructions   famotidine 20 MG tablet Commonly known as: Pepcid One after supper What changed:  how much to take how to take this when to take this additional instructions   FLUoxetine 40 MG capsule Commonly known as: PROZAC Take 40 mg by mouth daily.    Flutter Devi Use as directed   furosemide 20 MG tablet Commonly known as: LASIX TAKE 1 TABLET BY MOUTH  DAILY   gabapentin 600 MG tablet Commonly known as: NEURONTIN Take 1 tablet (600 mg total) by mouth 3 (three) times daily. What changed: when to take this   levothyroxine 100 MCG tablet Commonly known as: SYNTHROID TAKE 1 TABLET BY MOUTH ONCE DAILY BEFORE BREAKFAST What changed:  how much to take how to take this when to take this additional instructions   multivitamin with minerals Tabs tablet Take 1 tablet by mouth daily.   Myrbetriq 25 MG Tb24 tablet Generic drug: mirabegron ER Take 25 mg by mouth daily.   ocrelizumab 300 MG/10ML injection Commonly known as: OCREVUS Inject 300 mg into the vein every 6 (six) months.   pilocarpine 7.5 MG tablet Commonly known as: SALAGEN Take 7.5 mg by mouth 3 (three) times daily.   PROBIOTIC PO Take 1 capsule by mouth daily. Harmony probiotic   tamsulosin 0.4 MG Caps capsule Commonly known as: FLOMAX Take 0.4 mg by mouth daily.   temazepam 15 MG capsule Commonly known as: RESTORIL Take 15 mg by mouth at bedtime.        Follow-up Information     Ronnald Nian, DO.  Schedule an appointment as soon as possible for a visit.   Specialty: Family Medicine Contact information: [place] [address] [phone]         Tanda Rockers, MD Follow up.   Specialty: Pulmonary Disease Contact information: [address] [place] [address] [phone]         Ladene Artist, MD. Schedule an appointment as soon as possible for a visit.   Specialty: Gastroenterology Contact information: [address] [place] [address] [phone]                Allergies  Allergen Reactions   Tramadol     hallucinations    Contrast Media [Iodinated Diagnostic Agents] Rash   Iohexol Hives   Methylprednisolone Other (See Comments)    Reaction:  Decreases pts heart  rate    Morphine And Related Other (See Comments)    Reaction:  Hallucinations    Oxycodone-Acetaminophen Rash and Other (See Comments)    Reaction:  Hallucinations    Phenytoin Sodium Extended Rash   Zanaflex [Tizanidine Hydrochloride] Rash    Consultations: PCCM SLP  Procedures/Studies: DG Chest 2 View  Result Date: 09/27/2020 CLINICAL DATA:  Cough for the past 4 months. EXAM: CHEST - 2 VIEW COMPARISON:  Chest x-ray dated July 18, 2020. FINDINGS: The heart size and mediastinal contours are within normal limits. Normal pulmonary vascularity. New patchy somewhat nodular airspace disease in the left lower lobe. No pleural effusion or pneumothorax. Unchanged eventration of the right hemidiaphragm. No acute osseous abnormality. Old healed fracture of the right proximal humerus. IMPRESSION: 1. Patchy left lower lobe pneumonia. Electronically Signed   By: Titus Dubin M.D.   On: 09/27/2020 11:09   CT Chest Wo Contrast  Result Date: 09/27/2020 CLINICAL DATA:  Cough for the past 4 months. EXAM: CT CHEST WITHOUT CONTRAST TECHNIQUE: Multidetector CT imaging of the chest was performed following the standard protocol without IV contrast. COMPARISON:  Chest x-ray from same day. CT chest dated April 10, 2019. FINDINGS: Cardiovascular: No significant vascular findings. Normal heart size. No pericardial effusion. No thoracic aortic aneurysm. Mild atherosclerotic calcification of the thoracic aorta. Mediastinum/Nodes: No enlarged mediastinal or axillary lymph nodes. Thyroid gland and esophagus demonstrate no significant findings. Lungs/Pleura: Small amount of layering secretions in the distal trachea. New peribronchovascular ground-glass densities with inter- and interlobular septal thickening in the right lower lobe (series 4, image 101). New more focal small nodular consolidations in the medial right lower lobe (series 4, images 81 and 93). Milder peribronchovascular nodular densities in the left lower  lobe. New 14 x 10 mm nodular density in the medial right middle lobe (series 4, image 79). No pleural effusion or pneumothorax. Upper Abdomen: No acute abnormality. Unchanged small cyst in the central right liver. Musculoskeletal: No acute or significant osseous findings. Old healed fracture of the proximal sternal body, new since January 2021. IMPRESSION: 1. New peribronchovascular ground-glass densities with septal thickening in the right lower lobe and milder peribronchovascular nodular densities in the left lower lobe, concerning for multifocal atypical pneumonia, possibly aspiration related given small amount of layering secretions in the distal trachea. 2. New 14 x 10 mm nodular density in the medial right middle lobe is likely related to the above process,, but attention on follow-up imaging is recommended. 3. Aortic Atherosclerosis (ICD10-I70.0). Electronically Signed   By: Titus Dubin M.D.   On: 09/27/2020 12:19      Subjective: Wants to go home. Cough  is still there but generally improved. No fever or feeling ill or dyspnea.   Discharge Exam: Vitals:   09/29/20 0520 09/29/20 0745  BP: (!) 114/49   Pulse: 63   Resp: 18   Temp: 98.3 F (36.8 C)   SpO2: 96% 97%   General: Pt is alert, awake, not in acute distress Cardiovascular: RRR, S1/S2 +, no rubs, no gallops Respiratory: CTA bilaterally, no wheezing, no rhonchi Abdominal: Soft, NT, ND, bowel sounds + Extremities: Stable chronic edema  Labs: BNP (last 3 results) No results for input(s): BNP in the last 8760 hours. Basic Metabolic Panel: Recent Labs  Lab 09/27/20 1049 09/27/20 1534 09/28/20 0512  NA 138  --  135  K 4.0  --  3.9  CL 101  --  101  CO2 26  --  26  GLUCOSE 92  --  102*  BUN 11  --  11  CREATININE 0.67 0.67 0.68  CALCIUM 9.2  --  9.0   Liver Function Tests: Recent Labs  Lab 09/28/20 0512  AST 16  ALT 13  ALKPHOS 63  BILITOT 0.4  PROT 5.9*  ALBUMIN 3.3*   No results for input(s): LIPASE,  AMYLASE in the last 168 hours. No results for input(s): AMMONIA in the last 168 hours. CBC: Recent Labs  Lab 09/27/20 1049 09/27/20 1534 09/28/20 0512  WBC 11.6* 11.3* 9.0  NEUTROABS 9.4*  --   --   HGB 10.5* 10.2* 9.7*  HCT 33.8* 32.2* 30.9*  MCV 88.0 88.7 88.0  PLT 453* 377 406*   Cardiac Enzymes: No results for input(s): CKTOTAL, CKMB, CKMBINDEX, TROPONINI in the last 168 hours. BNP: Invalid input(s): POCBNP CBG: No results for input(s): GLUCAP in the last 168 hours. D-Dimer No results for input(s): DDIMER in the last 72 hours. Hgb A1c No results for input(s): HGBA1C in the last 72 hours. Lipid Profile No results for input(s): CHOL, HDL, LDLCALC, TRIG, CHOLHDL, LDLDIRECT in the last 72 hours. Thyroid function studies No results for input(s): TSH, T4TOTAL, T3FREE, THYROIDAB in the last 72 hours.  Invalid input(s): FREET3 Anemia work up No results for input(s): VITAMINB12, FOLATE, FERRITIN, TIBC, IRON, RETICCTPCT in the last 72 hours. Urinalysis    Component Value Date/Time   COLORURINE YELLOW 11/04/2018 [place] 11/04/2018 1647   LABSPEC 1.015 11/04/2018 1647   PHURINE 7.0 11/04/2018 1647   GLUCOSEU NEGATIVE 11/04/2018 1647   HGBUR NEGATIVE 11/04/2018 1647   BILIRUBINUR NEGATIVE 11/04/2018 1647   KETONESUR NEGATIVE 11/04/2018 1647   PROTEINUR NEGATIVE 11/07/2016 2359   UROBILINOGEN 0.2 11/04/2018 1647   NITRITE NEGATIVE 11/04/2018 [place] 11/04/2018 1647    Microbiology Recent Results (from the past 240 hour(s))  Resp Panel by RT-PCR (Flu A&B, Covid) Nasopharyngeal Swab     Status: None   Collection Time: 09/27/20 10:49 AM   Specimen: Nasopharyngeal Swab; Nasopharyngeal(NP) swabs in vial transport medium  Result Value Ref Range Status   SARS Coronavirus 2 by RT PCR NEGATIVE NEGATIVE Final    Comment: (NOTE) SARS-CoV-2 target nucleic acids are NOT DETECTED.  The SARS-CoV-2 RNA is generally detectable in upper  respiratory specimens during the acute phase of infection. The lowest concentration of SARS-CoV-2 viral copies this assay can detect is 138 copies/mL. A negative result does not preclude SARS-Cov-2 infection and should not be used as the sole basis for treatment or other patient management decisions. A negative result may occur with  improper specimen collection/handling, submission of specimen other than  nasopharyngeal swab, presence of viral mutation(s) within the areas targeted by this assay, and inadequate number of viral copies(<138 copies/mL). A negative result must be combined with clinical observations, patient history, and epidemiological information. The expected result is Negative.  Fact Sheet for Patients:  EntrepreneurPulse.com.au  Fact Sheet for Healthcare Providers:  IncredibleEmployment.be  This test is no t yet approved or cleared by the [place] FDA and  has been authorized for detection and/or diagnosis of SARS-CoV-2 by FDA under an Emergency Use Authorization (EUA). This EUA will remain  in effect (meaning this test can be used) for the duration of the COVID-19 declaration under Section 564(b)(1) of the Act, 21 U.S.C.section 360bbb-3(b)(1), unless the authorization is terminated  or revoked sooner.       Influenza A by PCR NEGATIVE NEGATIVE Final   Influenza B by PCR NEGATIVE NEGATIVE Final    Comment: (NOTE) The Xpert Xpress SARS-CoV-2/FLU/RSV plus assay is intended as an aid in the diagnosis of influenza from Nasopharyngeal swab specimens and should not be used as a sole basis for treatment. Nasal washings and aspirates are unacceptable for Xpert Xpress SARS-CoV-2/FLU/RSV testing.  Fact Sheet for Patients: EntrepreneurPulse.com.au  Fact Sheet for Healthcare Providers: IncredibleEmployment.be  This test is not yet approved or cleared by the [place] FDA and has been  authorized for detection and/or diagnosis of SARS-CoV-2 by FDA under an Emergency Use Authorization (EUA). This EUA will remain in effect (meaning this test can be used) for the duration of the COVID-19 declaration under Section 564(b)(1) of the Act, 21 U.S.C. section 360bbb-3(b)(1), unless the authorization is terminated or revoked.  Performed at [facility], [place] [address], [place], [place] [address]   Culture, blood (routine x 2)     Status: None (Preliminary result)   Collection Time: 09/27/20  1:13 PM   Specimen: BLOOD RIGHT WRIST  Result Value Ref Range Status   Specimen Description   Final    BLOOD RIGHT WRIST Performed at [facility] [address], [place], [place] [address]    Special Requests   Final    BOTTLES DRAWN AEROBIC AND ANAEROBIC Blood Culture adequate volume Performed at [facility] [address], [place], [place] [address]    Culture   Final    NO GROWTH 2 DAYS Performed at [facility] [address], [place], [place] [address]    Report Status PENDING  Incomplete  Culture, blood (routine x 2)     Status: None (Preliminary result)   Collection Time: 09/27/20  1:14 PM   Specimen: BLOOD  Result Value Ref Range Status   Specimen Description   Final    BLOOD LEFT ANTECUBITAL Performed at [facility] [address] [place], [place] [address]    Special Requests   Final    BOTTLES DRAWN AEROBIC AND ANAEROBIC Blood Culture adequate volume Performed at [facility] [address] [place], [place] [address]    Culture   Final    NO GROWTH 2 DAYS Performed at [facility] [address], [place], [place] [address]    Report Status PENDING  Incomplete  MRSA Next Gen by PCR, Nasal     Status: None   Collection Time: 09/27/20  9:00 PM   Specimen: Nasal Mucosa; Nasal Swab  Result Value Ref Range Status   MRSA by PCR Next Gen NOT DETECTED NOT  DETECTED Final    Comment: (NOTE) The GeneXpert  MRSA Assay (FDA approved for NASAL specimens only), is one component of a comprehensive MRSA colonization surveillance program. It is not intended to diagnose MRSA infection nor to guide or monitor treatment for MRSA infections. Test performance is not FDA approved in patients less than 24 years old. Performed at [facility], [place] [address] [place], [place] [address]   Expectorated Sputum Assessment w Gram Stain, Rflx to Resp Cult     Status: None   Collection Time: 09/28/20 12:30 PM   Specimen: Sputum  Result Value Ref Range Status   Specimen Description SPUTUM  Final   Special Requests NONE  Final   Sputum evaluation   Final    THIS SPECIMEN IS ACCEPTABLE FOR SPUTUM CULTURE Performed at [facility], [place] [address], [place], [place] [address]    Report Status 09/28/2020 FINAL  Final  Respiratory (~20 pathogens) panel by PCR     Status: None   Collection Time: 09/28/20 12:50 PM  Result Value Ref Range Status   Adenovirus NOT DETECTED NOT DETECTED Final   Coronavirus 229E NOT DETECTED NOT DETECTED Final    Comment: (NOTE) The Coronavirus on the Respiratory Panel, DOES NOT test for the novel  Coronavirus (2019 nCoV)    Coronavirus HKU1 NOT DETECTED NOT DETECTED Final   Coronavirus NL63 NOT DETECTED NOT DETECTED Final   Coronavirus OC43 NOT DETECTED NOT DETECTED Final   Metapneumovirus NOT DETECTED NOT DETECTED Final   Rhinovirus / Enterovirus NOT DETECTED NOT DETECTED Final   Influenza A NOT DETECTED NOT DETECTED Final   Influenza B NOT DETECTED NOT DETECTED Final   Parainfluenza Virus 1 NOT DETECTED NOT DETECTED Final   Parainfluenza Virus 2 NOT DETECTED NOT DETECTED Final   Parainfluenza Virus 3 NOT DETECTED NOT DETECTED Final   Parainfluenza Virus 4 NOT DETECTED NOT DETECTED Final   Respiratory Syncytial Virus NOT DETECTED NOT DETECTED Final   Bordetella pertussis NOT DETECTED NOT  DETECTED Final   Bordetella Parapertussis NOT DETECTED NOT DETECTED Final   Chlamydophila pneumoniae NOT DETECTED NOT DETECTED Final   Mycoplasma pneumoniae NOT DETECTED NOT DETECTED Final    Comment: Performed at [facility], [place]. [address], [place], [place] [address]    Time coordinating discharge: Approximately 40 minutes  Patrecia Pour, MD  [facility] 09/29/2020, 11:44 AM

## 2020-09-29 NOTE — Progress Notes (Signed)
Patient discharged via wheelchair accompanied by staff and husband. Pt. Alert and oriented, no distress. Discharge instruction and education given, pt. Verbalized understanding. All personal belongings are with the patient/

## 2020-09-29 NOTE — Plan of Care (Signed)

## 2020-10-01 ENCOUNTER — Telehealth: Payer: Self-pay | Admitting: *Deleted

## 2020-10-01 LAB — LEGIONELLA PNEUMOPHILA SEROGP 1 UR AG: L. pneumophila Serogp 1 Ur Ag: NEGATIVE

## 2020-10-01 LAB — CULTURE, RESPIRATORY W GRAM STAIN: Culture: NORMAL

## 2020-10-01 LAB — ANTI-SCLERODERMA ANTIBODY: Scleroderma (Scl-70) (ENA) Antibody, IgG: <0.2 AI (ref 0.0–0.9)

## 2020-10-01 LAB — ANTI-DNA ANTIBODY, DOUBLE-STRANDED: ds DNA Ab: 3 IU/mL (ref 0–9)

## 2020-10-01 NOTE — Telephone Encounter (Signed)
Transition Care Management Follow-up Telephone Call Date of discharge and from where: [facility] How have you been since you were released from the hospital? Feeling much better Any questions or concerns? No  Items Reviewed: Did the pt receive and understand the discharge instructions provided? Yes  Medications obtained and verified?  No medications given Other? No  Any new allergies since your discharge? No  Dietary orders reviewed? Yes Do you have support at home? Yes   Home Care and Equipment/Supplies: Were home health services ordered? no If so, what is the name of the agency?   Has the agency set up a time to come to the patient's home? not applicable Were any new equipment or medical supplies ordered?  No What is the name of the medical supply agency?  Were you able to get the supplies/equipment? not applicable Do you have any questions related to the use of the equipment or supplies? No  Functional Questionnaire: (I = Independent and D = Dependent) ADLs: I  Bathing/Dressing- I  Meal Prep- I  Eating- I  Maintaining continence- I  Transferring/Ambulation- I  Managing Meds- I  Follow up appointments reviewed:  PCP Hospital f/u appt confirmed? No   Specialist Hospital f/u appt confirmed? Yes  Scheduled to see Dr. Fuller Plan  Are transportation arrangements needed? No  If their condition worsens, is the pt aware to call PCP or go to the Emergency Dept.? Yes Was the patient provided with contact information for the PCP's office or ED? Yes Was to pt encouraged to call back with questions or concerns? Yes

## 2020-10-02 LAB — CULTURE, BLOOD (ROUTINE X 2)
Culture: NO GROWTH
Culture: NO GROWTH
Special Requests: ADEQUATE
Special Requests: ADEQUATE

## 2020-10-04 ENCOUNTER — Encounter: Payer: Self-pay | Admitting: Physician Assistant

## 2020-10-04 ENCOUNTER — Ambulatory Visit (INDEPENDENT_AMBULATORY_CARE_PROVIDER_SITE_OTHER): Payer: Medicare Other | Admitting: Physician Assistant

## 2020-10-04 ENCOUNTER — Other Ambulatory Visit (HOSPITAL_COMMUNITY): Payer: Self-pay

## 2020-10-04 VITALS — BP 110/70 | HR 88 | Ht 62.75 in | Wt 118.4 lb

## 2020-10-04 DIAGNOSIS — I75023 Atheroembolism of bilateral lower extremities: Secondary | ICD-10-CM

## 2020-10-04 DIAGNOSIS — J690 Pneumonitis due to inhalation of food and vomit: Secondary | ICD-10-CM

## 2020-10-04 DIAGNOSIS — R1310 Dysphagia, unspecified: Secondary | ICD-10-CM

## 2020-10-04 DIAGNOSIS — Z800 Family history of malignant neoplasm of digestive organs: Secondary | ICD-10-CM | POA: Diagnosis not present

## 2020-10-04 DIAGNOSIS — K219 Gastro-esophageal reflux disease without esophagitis: Secondary | ICD-10-CM | POA: Diagnosis not present

## 2020-10-04 DIAGNOSIS — R059 Cough, unspecified: Secondary | ICD-10-CM

## 2020-10-04 MED ORDER — NA SULFATE-K SULFATE-MG SULF 17.5-3.13-1.6 GM/177ML PO SOLN: 1.0000 | Freq: Once | ORAL | 0 refills | Status: AC

## 2020-10-04 NOTE — Patient Instructions (Signed)
You have been scheduled for a modified barium swallow on Monday 10/15/20 at 11:30 am. Please arrive 15 minutes prior to your test for registration. You will go to [facility] (1st Floor) for your appointment. Should you need to cancel or reschedule your appointment, please contact [phone] [facility] [place]) or [phone] [place]). ______________________________________________________________ A Modified Barium Swallow Study, or MBS, is a special x-ray that is taken to check swallowing skills. It is carried out by a Stage manager and a Psychologist, clinical (SLP). During this test, yourmouth, throat, and esophagus, a muscular tube which connects your mouth to your stomach, is checked. The test will help you, your doctor, and the SLP plan what types of foods and liquids are easier for you to swallow. The SLP will also identify positions and ways to help you swallow more easily and safely. What will happen during an MBS? You will be taken to an x-ray room and seated comfortably. You will be asked to swallow small amounts of food and liquid mixed with barium. Barium is a liquid or paste that allows images of your mouth, throat and esophagus to be seen on x-ray. The x-ray captures moving images of the food you are swallowing as it travels from your mouth through your throat and into your esophagus. This test helps identify whether food or liquid is entering your lungs (aspiration). The test also shows which part of your mouth or throat lacks strength or coordination to move the food or liquid in the right direction. This test typically takes 30 minutes to 1 hour to complete. ____________________________________________________________________  Dennis Bast have been scheduled for an endoscopy and colonoscopy. Please follow the written instructions given to you at your visit today. Please pick up your prep supplies at the pharmacy within the next 1-3 days. If you use inhalers (even only as needed),  please bring them with you on the day of your procedure.  If you are age 93 or older, your body mass index should be between 23-30. Your Body mass index is 21.14 kg/m. If this is out of the aforementioned range listed, please consider follow up with your Primary Care Provider.  If you are age 57 or younger, your body mass index should be between 19-25. Your Body mass index is 21.14 kg/m. If this is out of the aformentioned range listed, please consider follow up with your Primary Care Provider.   __________________________________________________________  The [facility] providers would like to encourage you to use [facility] to communicate with providers for non-urgent requests or questions.  Due to long hold times on the telephone, sending your provider a message by [facility] may be a faster and more efficient way to get a response.  Please allow 48 business hours for a response.  Please remember that this is for non-urgent requests.

## 2020-10-04 NOTE — Progress Notes (Signed)
Reviewed and agree with management plan.  Cattleya Dobratz T. Sherran Margolis, MD FACG 

## 2020-10-04 NOTE — Progress Notes (Signed)
Chief Complaint: GERD, aspiration pneumonia, history of colon polyps  HPI:    Kayla [name] is a 70 year old Caucasian female with a past medical history as listed below including cervical cancer, COPD, multiple sclerosis and others, known to Dr. Fuller Plan, who was referred to me by Ronnald Nian, DO for a complaint of recent aspiration pneumonia, GERD and history of colon polyp.      01/09/2005 EGD with a 3 cm hiatal hernia and otherwise normal    06/21/2015 colonoscopy done for a family history of first-degree relative with colorectal cancer which was normal.  Repeat recommended 5 years with a more extensive bowel prep.    09/27/2020-09/29/2020 patient admitted for aspiration pneumonia.  At that time discussed a persistent productive cough.  CT showed multifocal pneumonia.  Work-up was negative and as such she was felt to be at some risk of aspiration.  Was recommended she follow-up with GI for possible modified barium swallow and EGD at time of discharge.    Today, patient presents to clinic and tells me that she was just in the hospital for what she thought was aspiration pneumonia.  They told her to follow-up with [place] at time of discharge.  She explains she has chronic reflux which "I did not know until they told me".  Currently on Dexilant 60 mg every morning and Pepcid 20 mg nightly.  Tells me she never feels heartburn or reflux symptoms and has no real GI complaints at all.  Does describe a cough which she has had for 4 months, she is being followed by her PCP and pulmonology about this.    Denies fever, chills, weight loss or symptoms that awaken her from sleep.  Past Medical History:  Diagnosis Date   Arthritis    oa   Cervical cancer ([place]) 2005   COPD (chronic obstructive pulmonary disease) ([place])    Multiple sclerosis ([place])    Osteoporosis    Other diseases of vocal cords    vocal cords are not even   Other voice and resonance disorders    Pneumonia 2014, 1992   Sleep apnea    no cpap  used   Unspecified hypothyroidism     Past Surgical History:  Procedure Laterality Date   ABDOMINAL HYSTERECTOMY  2005   for cervical cancer, complete   CONVERSION TO TOTAL HIP Right 09/18/2015   Procedure: CONVERSION OF RIGHT HEMI ARTHROPLASTY TO TOTAL HIP ARTHROPLASTY ACETABULAR REVISON ;  Surgeon: Paralee Cancel, MD;  Location: [facility];  Service: Orthopedics;  Laterality: Right;   EYE SURGERY Bilateral    lens for cataracts   HIP ARTHROPLASTY Right 03/01/2013   Procedure: ARTHROPLASTY BIPOLAR HIP;  Surgeon: Mauri Pole, MD;  Location: [facility];  Service: Orthopedics;  Laterality: Right;   HIP CLOSED REDUCTION Right 03/12/2013   Procedure: CLOSED REDUCTION HIP;  Surgeon: Gearlean Alf, MD;  Location: [facility];  Service: Orthopedics;  Laterality: Right;   HIP CLOSED REDUCTION Right 03/19/2013   Procedure: CLOSED REDUCTION HIP;  Surgeon: Johnn Hai, MD;  Location: [facility];  Service: Orthopedics;  Laterality: Right;   HIP CLOSED REDUCTION Right 11/10/2015   Procedure: CLOSED MANIPULATION HIP;  Surgeon: Rod Can, MD;  Location: [facility];  Service: Orthopedics;  Laterality: Right;   HIP CLOSED REDUCTION Right 06/05/2017   Procedure: CLOSED REDUCTION HIP;  Surgeon: [name], MD;  Location: [facility];  Service: Orthopedics;  Laterality: Right;   TOTAL HIP REVISION Right 06/06/2017   Procedure: TOTAL HIP REVISION;  Surgeon: [name], MD;  Location: [facility];  Service: Orthopedics;  Laterality: Right;   VESICOVAGINAL FISTULA CLOSURE W/ TAH  2005    Current Outpatient Medications  Medication Sig Dispense Refill   acetaminophen (TYLENOL) 500 MG tablet Take 1,000 mg by mouth every 6 (six) hours as needed for mild pain, fever or headache.     amphetamine-dextroamphetamine (ADDERALL) 20 MG tablet Take 20 mg by mouth daily.     baclofen (LIORESAL) 20 MG tablet Take 20 mg by mouth 3 (three) times daily.      CALCIUM PO Take 1 tablet by mouth daily.     dalfampridine 10 MG TB12 Take 10 mg  by mouth 2 (two) times daily.     dexlansoprazole (DEXILANT) 60 MG capsule TAKE 1 CAPSULE BY MOUTH  ONCE DAILY WITH BREAKFAST 90 capsule 2   famotidine (PEPCID) 20 MG tablet One after supper 30 tablet 11   FLUoxetine (PROZAC) 40 MG capsule Take 40 mg by mouth daily.     furosemide (LASIX) 20 MG tablet TAKE 1 TABLET BY MOUTH  DAILY 90 tablet 3   gabapentin (NEURONTIN) 600 MG tablet Take 1 tablet (600 mg total) by mouth 3 (three) times daily. 90 tablet 2   levothyroxine (SYNTHROID) 100 MCG tablet TAKE 1 TABLET BY MOUTH ONCE DAILY BEFORE BREAKFAST 90 tablet 3   mometasone-formoterol (DULERA) 100-5 MCG/ACT AERO USE 2 PUFFS EVERY 12 HOURS. 3 each 3   Multiple Vitamin (MULTIVITAMIN WITH MINERALS) TABS tablet Take 1 tablet by mouth daily.     MYRBETRIQ 25 MG TB24 tablet Take 25 mg by mouth daily.     ocrelizumab (OCREVUS) 300 MG/10ML injection Inject 300 mg into the vein every 6 (six) months.      pilocarpine (SALAGEN) 7.5 MG tablet Take 7.5 mg by mouth 3 (three) times daily.     Probiotic Product (PROBIOTIC PO) Take 1 capsule by mouth daily. Harmony probiotic     Respiratory Therapy Supplies (FLUTTER) DEVI Use as directed 1 each 0   tamsulosin (FLOMAX) 0.4 MG CAPS capsule Take 0.4 mg by mouth daily.     temazepam (RESTORIL) 15 MG capsule Take 15 mg by mouth at bedtime.     No current facility-administered medications for this visit.    Allergies as of 10/04/2020 - Review Complete 09/27/2020  Allergen Reaction Noted   Tramadol  02/10/2017   Contrast media [iodinated diagnostic agents] Rash 11/24/2017   Iohexol Hives 09/24/2015   Methylprednisolone Other (See Comments) 02/24/2013   Morphine and related Other (See Comments) 03/14/2013   Oxycodone-acetaminophen Rash and Other (See Comments) 10/29/2010   Phenytoin sodium extended Rash 10/29/2010   Zanaflex [tizanidine hydrochloride] Rash 10/29/2010    Family History  Problem Relation Age of Onset   Colon cancer Father     Social History    Socioeconomic History   Marital status: Married    Spouse name: Not on file   Number of children: Not on file   Years of education: Not on file   Highest education level: Not on file  Occupational History   Occupation: disabled [employer]  Tobacco Use   Smoking status: Never   Smokeless tobacco: Never  Vaping Use   Vaping Use: Never used  Substance and Sexual Activity   Alcohol use: Yes    Alcohol/week: 2.0 standard drinks    Types: 1 Glasses of wine, 1 Cans of beer per week    Comment: 2x/week    Drug use: No   Sexual activity:  Not on file  Other Topics Concern   Not on file  Social History Narrative   Not on file   Social Determinants of Health   Financial Resource Strain: Low Risk    Difficulty of Paying Living Expenses: Not hard at all  Food Insecurity: No Food Insecurity   Worried About [place] in the Last Year: Never true   [place] in the Last Year: Never true  Transportation Needs: No Transportation Needs   Lack of Transportation (Medical): No   Lack of Transportation (Non-Medical): No  Physical Activity: Insufficiently Active   Days of Exercise per Week: 2 days   Minutes of Exercise per Session: 30 min  Stress: No Stress Concern Present   Feeling of Stress : Not at all  Social Connections: Moderately Integrated   Frequency of Communication with Friends and Family: More than three times a week   Frequency of Social Gatherings with Friends and Family: Twice a week   Attends Religious Services: More than 4 times per year   Active Member of [employer] or Organizations: No   Attends Music therapist: Never   Marital Status: Married  Human resources officer Violence: Not At Risk   Fear of Current or Ex-Partner: No   Emotionally Abused: No   Physically Abused: No   Sexually Abused: No    Review of Systems:    Constitutional: No weight loss, fever or chills Skin: No rash  Cardiovascular: No chest pain Respiratory:  +cough Gastrointestinal: See HPI and otherwise negative Genitourinary: No dysuria  Neurological: No headache, dizziness or syncope Musculoskeletal: No new muscle or joint pain Hematologic: No bleeding  Psychiatric: No history of depression or anxiety   Physical Exam:  Vital signs: BP 110/70 (BP Location: Left Arm, Patient Position: Sitting, Cuff Size: Normal)   Pulse 88   Ht 5' 2.75" (1.594 m) Comment: height measured without shoes  Wt 118 lb 6 oz (53.7 kg)   BMI 21.14 kg/m    Constitutional:   Pleasant thin, ill appearing Caucasian female appears to be in NAD, Well developed, Well nourished, alert and cooperative Head:  Normocephalic and atraumatic. Eyes:   PEERL, EOMI. No icterus. Conjunctiva pink. Ears:  Normal auditory acuity. Neck:  Supple Throat: Oral cavity and pharynx without inflammation, swelling or lesion.  Respiratory: Respirations even and unlabored. +wheeze and cough Cardiovascular: Normal S1, S2. No MRG. Regular rate and rhythm. No peripheral edema, cyanosis or pallor.  Gastrointestinal:  Soft, nondistended, nontender. No rebound or guarding. Normal bowel sounds. No appreciable masses or hepatomegaly. Rectal:  Not performed.  Msk:  Symmetrical without gross deformities. Without edema, no deformity or joint abnormality.  Neurologic:  Alert and  oriented x4;  grossly normal neurologically.  Skin:   Dry and intact without significant lesions or rashes. Psychiatric:  Demonstrates good judgement and reason without abnormal affect or behaviors.  RELEVANT LABS AND IMAGING: CBC    Component Value Date/Time   WBC 9.0 09/28/2020 0512   RBC 3.51 (L) 09/28/2020 0512   HGB 9.7 (L) 09/28/2020 0512   HCT 30.9 (L) 09/28/2020 0512   PLT 406 (H) 09/28/2020 0512   MCV 88.0 09/28/2020 0512   MCH 27.6 09/28/2020 0512   MCHC 31.4 09/28/2020 0512   RDW 14.4 09/28/2020 0512   LYMPHSABS 0.9 09/27/2020 1049   MONOABS 1.1 (H) 09/27/2020 1049   EOSABS 0.1 09/27/2020 1049   BASOSABS  0.0 09/27/2020 1049    CMP     Component Value  Date/Time   NA 135 09/28/2020 0512   K 3.9 09/28/2020 0512   CL 101 09/28/2020 0512   CO2 26 09/28/2020 0512   GLUCOSE 102 (H) 09/28/2020 0512   BUN 11 09/28/2020 0512   CREATININE 0.68 09/28/2020 0512   CALCIUM 9.0 09/28/2020 0512   PROT 5.9 (L) 09/28/2020 0512   ALBUMIN 3.3 (L) 09/28/2020 0512   AST 16 09/28/2020 0512   ALT 13 09/28/2020 0512   ALKPHOS 63 09/28/2020 0512   BILITOT 0.4 09/28/2020 0512   GFRNONAA >60 09/28/2020 0512   GFRAA >60 06/08/2017 0704    Assessment: 1.  Chronic cough: For the past 4 months, has been following with PCP as well as pulmonology, some question in regards to GI origin 2.  GERD: No symptoms on Dexilant 60 mg daily and Pepcid 20 mg nightly 3.  Aspiration pneumonia: Recent hospitalization, they requested follow-up with [place] including modified barium swallow and likely EGD 4.  Family history of colon cancer in her father: Last colonoscopy 5 years ago with repeat recommended in 5 years  Plan: 1.  Scheduled patient for a modified barium swallow.  Pending results of this will likely need an EGD. 2.  We will go ahead and schedule patient for an EGD and colonoscopy in 1 to 2 months, that will allow [place] time to get back modified barium swallow.  These were scheduled with Dr. Fuller Plan.  Did provide the patient a detailed list of risks for the procedures and she agrees to proceed. 3.  Patient to continue her Dexilant 60 mg daily and Pepcid 20mg  qd 4.  Patient to await further recommendations after time of barium swallow.  Ellouise Newer, PA-C [facility] 10/04/2020, 1:30 PM  Cc: Ronnald Nian, DO

## 2020-10-09 ENCOUNTER — Encounter: Payer: Self-pay | Admitting: Family Medicine

## 2020-10-10 LAB — ANTINUCLEAR ANTIBODIES, IFA: ANA Ab, IFA: NEGATIVE

## 2020-10-15 ENCOUNTER — Ambulatory Visit (HOSPITAL_COMMUNITY)
Admission: RE | Admit: 2020-10-15 | Discharge: 2020-10-15 | Disposition: A | Discharge status: Home / Self Care | Payer: Medicare Other | Source: Ambulatory Visit | Attending: Physician Assistant | Admitting: Physician Assistant

## 2020-10-15 ENCOUNTER — Other Ambulatory Visit: Payer: Self-pay

## 2020-10-15 DIAGNOSIS — J690 Pneumonitis due to inhalation of food and vomit: Secondary | ICD-10-CM | POA: Diagnosis not present

## 2020-10-15 DIAGNOSIS — K219 Gastro-esophageal reflux disease without esophagitis: Secondary | ICD-10-CM

## 2020-10-15 DIAGNOSIS — R059 Cough, unspecified: Secondary | ICD-10-CM

## 2020-10-15 DIAGNOSIS — R1310 Dysphagia, unspecified: Secondary | ICD-10-CM | POA: Diagnosis not present

## 2020-10-17 DIAGNOSIS — R3914 Feeling of incomplete bladder emptying: Secondary | ICD-10-CM | POA: Diagnosis not present

## 2020-10-17 DIAGNOSIS — N130 Hydronephrosis with ureteropelvic junction obstruction: Secondary | ICD-10-CM | POA: Diagnosis not present

## 2020-10-20 ENCOUNTER — Other Ambulatory Visit: Payer: Self-pay | Admitting: Internal Medicine

## 2020-10-23 ENCOUNTER — Inpatient Hospital Stay (HOSPITAL_COMMUNITY)
Admission: EM | Admit: 2020-10-23 | Discharge: 2020-10-25 | DRG: 179 | Disposition: A | Discharge status: Home / Self Care | Payer: Medicare Other | Attending: Internal Medicine | Admitting: Internal Medicine

## 2020-10-23 ENCOUNTER — Encounter (HOSPITAL_COMMUNITY): Payer: Self-pay

## 2020-10-23 ENCOUNTER — Other Ambulatory Visit: Payer: Self-pay

## 2020-10-23 ENCOUNTER — Emergency Department (HOSPITAL_COMMUNITY): Payer: Medicare Other

## 2020-10-23 DIAGNOSIS — T17908D Unspecified foreign body in respiratory tract, part unspecified causing other injury, subsequent encounter: Secondary | ICD-10-CM | POA: Diagnosis not present

## 2020-10-23 DIAGNOSIS — J9809 Other diseases of bronchus, not elsewhere classified: Secondary | ICD-10-CM | POA: Diagnosis present

## 2020-10-23 DIAGNOSIS — Z9842 Cataract extraction status, left eye: Secondary | ICD-10-CM

## 2020-10-23 DIAGNOSIS — R059 Cough, unspecified: Secondary | ICD-10-CM | POA: Diagnosis not present

## 2020-10-23 DIAGNOSIS — J3802 Paralysis of vocal cords and larynx, bilateral: Secondary | ICD-10-CM | POA: Diagnosis present

## 2020-10-23 DIAGNOSIS — Z8541 Personal history of malignant neoplasm of cervix uteri: Secondary | ICD-10-CM

## 2020-10-23 DIAGNOSIS — M810 Age-related osteoporosis without current pathological fracture: Secondary | ICD-10-CM | POA: Diagnosis present

## 2020-10-23 DIAGNOSIS — J690 Pneumonitis due to inhalation of food and vomit: Secondary | ICD-10-CM | POA: Diagnosis not present

## 2020-10-23 DIAGNOSIS — R682 Dry mouth, unspecified: Secondary | ICD-10-CM | POA: Diagnosis present

## 2020-10-23 DIAGNOSIS — Z9841 Cataract extraction status, right eye: Secondary | ICD-10-CM

## 2020-10-23 DIAGNOSIS — Z79890 Hormone replacement therapy (postmenopausal): Secondary | ICD-10-CM

## 2020-10-23 DIAGNOSIS — Z961 Presence of intraocular lens: Secondary | ICD-10-CM | POA: Diagnosis present

## 2020-10-23 DIAGNOSIS — Z79899 Other long term (current) drug therapy: Secondary | ICD-10-CM

## 2020-10-23 DIAGNOSIS — Z20822 Contact with and (suspected) exposure to covid-19: Secondary | ICD-10-CM | POA: Diagnosis not present

## 2020-10-23 DIAGNOSIS — J189 Pneumonia, unspecified organism: Secondary | ICD-10-CM

## 2020-10-23 DIAGNOSIS — E039 Hypothyroidism, unspecified: Secondary | ICD-10-CM | POA: Diagnosis not present

## 2020-10-23 DIAGNOSIS — G35 Multiple sclerosis: Secondary | ICD-10-CM | POA: Diagnosis present

## 2020-10-23 DIAGNOSIS — J449 Chronic obstructive pulmonary disease, unspecified: Secondary | ICD-10-CM | POA: Diagnosis not present

## 2020-10-23 DIAGNOSIS — E785 Hyperlipidemia, unspecified: Secondary | ICD-10-CM | POA: Diagnosis present

## 2020-10-23 DIAGNOSIS — R0602 Shortness of breath: Secondary | ICD-10-CM | POA: Diagnosis not present

## 2020-10-23 DIAGNOSIS — Z96641 Presence of right artificial hip joint: Secondary | ICD-10-CM | POA: Diagnosis present

## 2020-10-23 DIAGNOSIS — Z91041 Radiographic dye allergy status: Secondary | ICD-10-CM

## 2020-10-23 DIAGNOSIS — D649 Anemia, unspecified: Secondary | ICD-10-CM | POA: Diagnosis present

## 2020-10-23 DIAGNOSIS — G4730 Sleep apnea, unspecified: Secondary | ICD-10-CM | POA: Diagnosis present

## 2020-10-23 DIAGNOSIS — Z885 Allergy status to narcotic agent status: Secondary | ICD-10-CM

## 2020-10-23 DIAGNOSIS — T17908A Unspecified foreign body in respiratory tract, part unspecified causing other injury, initial encounter: Secondary | ICD-10-CM | POA: Diagnosis present

## 2020-10-23 DIAGNOSIS — R079 Chest pain, unspecified: Secondary | ICD-10-CM | POA: Diagnosis not present

## 2020-10-23 DIAGNOSIS — K219 Gastro-esophageal reflux disease without esophagitis: Secondary | ICD-10-CM | POA: Diagnosis present

## 2020-10-23 DIAGNOSIS — Z90710 Acquired absence of both cervix and uterus: Secondary | ICD-10-CM

## 2020-10-23 DIAGNOSIS — Z888 Allergy status to other drugs, medicaments and biological substances status: Secondary | ICD-10-CM

## 2020-10-23 LAB — RESP PANEL BY RT-PCR (FLU A&B, COVID) ARPGX2
Influenza A by PCR: NEGATIVE
Influenza B by PCR: NEGATIVE
SARS Coronavirus 2 by RT PCR: NEGATIVE

## 2020-10-23 LAB — CBC WITH DIFFERENTIAL/PLATELET
Abs Immature Granulocytes: 0.08 10*3/uL — ABNORMAL HIGH (ref 0.00–0.07)
Basophils Absolute: 0.1 10*3/uL (ref 0.0–0.1)
Basophils Relative: 0 %
Eosinophils Absolute: 0.1 10*3/uL (ref 0.0–0.5)
Eosinophils Relative: 1 %
HCT: 31 % — ABNORMAL LOW (ref 36.0–46.0)
Hemoglobin: 9.7 g/dL — ABNORMAL LOW (ref 12.0–15.0)
Immature Granulocytes: 1 %
Lymphocytes Relative: 12 %
Lymphs Abs: 1.4 10*3/uL (ref 0.7–4.0)
MCH: 27 pg (ref 26.0–34.0)
MCHC: 31.3 g/dL (ref 30.0–36.0)
MCV: 86.4 fL (ref 80.0–100.0)
Monocytes Absolute: 1.4 10*3/uL — ABNORMAL HIGH (ref 0.1–1.0)
Monocytes Relative: 12 %
Neutro Abs: 8.7 10*3/uL — ABNORMAL HIGH (ref 1.7–7.7)
Neutrophils Relative %: 74 %
Platelets: 495 10*3/uL — ABNORMAL HIGH (ref 150–400)
RBC: 3.59 MIL/uL — ABNORMAL LOW (ref 3.87–5.11)
RDW: 14.3 % (ref 11.5–15.5)
WBC: 11.8 10*3/uL — ABNORMAL HIGH (ref 4.0–10.5)
nRBC: 0 % (ref 0.0–0.2)

## 2020-10-23 LAB — LACTIC ACID, PLASMA: Lactic Acid, Venous: 0.8 mmol/L (ref 0.5–1.9)

## 2020-10-23 LAB — COMPREHENSIVE METABOLIC PANEL
ALT: 15 U/L (ref 0–44)
AST: 20 U/L (ref 15–41)
Albumin: 3.5 g/dL (ref 3.5–5.0)
Alkaline Phosphatase: 84 U/L (ref 38–126)
Anion gap: 11 (ref 5–15)
BUN: 10 mg/dL (ref 8–23)
CO2: 25 mmol/L (ref 22–32)
Calcium: 9 mg/dL (ref 8.9–10.3)
Chloride: 97 mmol/L — ABNORMAL LOW (ref 98–111)
Creatinine, Ser: 0.58 mg/dL (ref 0.44–1.00)
GFR, Estimated: >60 mL/min (ref >60)
Glucose, Bld: 87 mg/dL (ref 70–99)
Potassium: 3.5 mmol/L (ref 3.5–5.1)
Sodium: 133 mmol/L — ABNORMAL LOW (ref 135–145)
Total Bilirubin: 0.4 mg/dL (ref 0.3–1.2)
Total Protein: 6.8 g/dL (ref 6.5–8.1)

## 2020-10-23 LAB — BRAIN NATRIURETIC PEPTIDE: B Natriuretic Peptide: 129.3 pg/mL — ABNORMAL HIGH (ref 0.0–100.0)

## 2020-10-23 MED ORDER — ALBUTEROL SULFATE (2.5 MG/3ML) 0.083% IN NEBU: 2.5000 mg | INHALATION_SOLUTION | Freq: Four times a day (QID) | RESPIRATORY_TRACT | Status: DC | PRN

## 2020-10-23 MED ORDER — TAMSULOSIN HCL 0.4 MG PO CAPS
0.4000 mg | ORAL_CAPSULE | Freq: Every day | ORAL | Status: DC
  Administered 2020-10-24 – 2020-10-25 (×2): 0.4 mg via ORAL
  Filled 2020-10-23 (×2): qty 1

## 2020-10-23 MED ORDER — PANTOPRAZOLE SODIUM 40 MG PO TBEC
40.0000 mg | DELAYED_RELEASE_TABLET | Freq: Two times a day (BID) | ORAL | Status: DC
  Administered 2020-10-23 – 2020-10-25 (×4): 40 mg via ORAL
  Filled 2020-10-23 (×4): qty 1

## 2020-10-23 MED ORDER — GABAPENTIN 300 MG PO CAPS
600.0000 mg | ORAL_CAPSULE | Freq: Every day | ORAL | Status: DC
  Administered 2020-10-24 – 2020-10-25 (×2): 600 mg via ORAL
  Filled 2020-10-23 (×2): qty 2

## 2020-10-23 MED ORDER — FAMOTIDINE 20 MG PO TABS
20.0000 mg | ORAL_TABLET | Freq: Every day | ORAL | Status: DC
  Administered 2020-10-23 – 2020-10-24 (×2): 20 mg via ORAL
  Filled 2020-10-23 (×2): qty 1

## 2020-10-23 MED ORDER — PREDNISONE 20 MG PO TABS
20.0000 mg | ORAL_TABLET | Freq: Every day | ORAL | Status: DC
  Administered 2020-10-23 – 2020-10-24 (×2): 20 mg via ORAL
  Filled 2020-10-23 (×2): qty 1

## 2020-10-23 MED ORDER — DALFAMPRIDINE ER 10 MG PO TB12: 10.0000 mg | ORAL_TABLET | Freq: Two times a day (BID) | ORAL | Status: DC

## 2020-10-23 MED ORDER — PILOCARPINE HCL 5 MG PO TABS
7.5000 mg | ORAL_TABLET | Freq: Two times a day (BID) | ORAL | Status: DC
  Administered 2020-10-23 – 2020-10-25 (×4): 7.5 mg via ORAL
  Filled 2020-10-23 (×4): qty 2

## 2020-10-23 MED ORDER — IPRATROPIUM-ALBUTEROL 0.5-2.5 (3) MG/3ML IN SOLN
3.0000 mL | Freq: Four times a day (QID) | RESPIRATORY_TRACT | Status: DC
  Administered 2020-10-23 – 2020-10-24 (×3): 3 mL via RESPIRATORY_TRACT
  Filled 2020-10-23 (×3): qty 3

## 2020-10-23 MED ORDER — ENOXAPARIN SODIUM 40 MG/0.4ML IJ SOSY
40.0000 mg | PREFILLED_SYRINGE | Freq: Every day | INTRAMUSCULAR | Status: DC
  Administered 2020-10-23 – 2020-10-24 (×2): 40 mg via SUBCUTANEOUS
  Filled 2020-10-23 (×2): qty 0.4

## 2020-10-23 MED ORDER — TEMAZEPAM 15 MG PO CAPS
15.0000 mg | ORAL_CAPSULE | Freq: Every day | ORAL | Status: DC
  Administered 2020-10-23 – 2020-10-24 (×2): 15 mg via ORAL
  Filled 2020-10-23 (×2): qty 1

## 2020-10-23 MED ORDER — ENSURE ENLIVE PO LIQD
237.0000 mL | Freq: Two times a day (BID) | ORAL | Status: DC
  Administered 2020-10-24 – 2020-10-25 (×3): 237 mL via ORAL

## 2020-10-23 MED ORDER — PROBIOTIC PO TBEC: DELAYED_RELEASE_TABLET | Freq: Every day | ORAL | Status: DC

## 2020-10-23 MED ORDER — BENZONATATE 100 MG PO CAPS
200.0000 mg | ORAL_CAPSULE | Freq: Three times a day (TID) | ORAL | Status: DC
  Administered 2020-10-23 – 2020-10-25 (×6): 200 mg via ORAL
  Filled 2020-10-23 (×6): qty 2

## 2020-10-23 MED ORDER — GUAIFENESIN ER 600 MG PO TB12
1200.0000 mg | ORAL_TABLET | Freq: Two times a day (BID) | ORAL | Status: DC
  Administered 2020-10-23 – 2020-10-24 (×2): 1200 mg via ORAL
  Filled 2020-10-23 (×2): qty 2

## 2020-10-23 MED ORDER — SODIUM CHLORIDE 0.9 % IV SOLN
3.0000 g | Freq: Four times a day (QID) | INTRAVENOUS | Status: DC
  Administered 2020-10-23 – 2020-10-25 (×6): 3 g via INTRAVENOUS
  Filled 2020-10-23: qty 8
  Filled 2020-10-23 (×6): qty 3

## 2020-10-23 MED ORDER — LEVOTHYROXINE SODIUM 100 MCG PO TABS
100.0000 ug | ORAL_TABLET | Freq: Every day | ORAL | Status: DC
  Administered 2020-10-24 – 2020-10-25 (×2): 100 ug via ORAL
  Filled 2020-10-23 (×2): qty 1

## 2020-10-23 MED ORDER — SODIUM CHLORIDE 0.9 % IV SOLN
2.0000 g | Freq: Once | INTRAVENOUS | Status: AC
  Administered 2020-10-23: 2 g via INTRAVENOUS
  Filled 2020-10-23: qty 2

## 2020-10-23 MED ORDER — MIRABEGRON ER 25 MG PO TB24
25.0000 mg | ORAL_TABLET | Freq: Every day | ORAL | Status: DC
  Administered 2020-10-24 – 2020-10-25 (×2): 25 mg via ORAL
  Filled 2020-10-23 (×2): qty 1

## 2020-10-23 MED ORDER — MOMETASONE FURO-FORMOTEROL FUM 100-5 MCG/ACT IN AERO
2.0000 | INHALATION_SPRAY | Freq: Two times a day (BID) | RESPIRATORY_TRACT | Status: DC
  Administered 2020-10-23 – 2020-10-25 (×4): 2 via RESPIRATORY_TRACT
  Filled 2020-10-23: qty 8.8

## 2020-10-23 MED ORDER — ACETAMINOPHEN 500 MG PO TABS: 500.0000 mg | ORAL_TABLET | Freq: Four times a day (QID) | ORAL | Status: DC | PRN

## 2020-10-23 MED ORDER — FLUOXETINE HCL 20 MG PO CAPS
40.0000 mg | ORAL_CAPSULE | Freq: Every day | ORAL | Status: DC
  Administered 2020-10-24 – 2020-10-25 (×2): 40 mg via ORAL
  Filled 2020-10-23 (×2): qty 2

## 2020-10-23 MED ORDER — RISAQUAD PO CAPS
1.0000 | ORAL_CAPSULE | Freq: Every day | ORAL | Status: DC
  Administered 2020-10-24 – 2020-10-25 (×2): 1 via ORAL
  Filled 2020-10-23 (×2): qty 1

## 2020-10-23 MED ORDER — VANCOMYCIN HCL IN DEXTROSE 1-5 GM/200ML-% IV SOLN
1000.0000 mg | Freq: Once | INTRAVENOUS | Status: AC
  Administered 2020-10-23: 1000 mg via INTRAVENOUS
  Filled 2020-10-23: qty 200

## 2020-10-23 MED ORDER — FUROSEMIDE 20 MG PO TABS
20.0000 mg | ORAL_TABLET | Freq: Every day | ORAL | Status: DC
  Administered 2020-10-24 – 2020-10-25 (×2): 20 mg via ORAL
  Filled 2020-10-23 (×2): qty 1

## 2020-10-23 MED ORDER — BACLOFEN 20 MG PO TABS: 20.0000 mg | ORAL_TABLET | Freq: Three times a day (TID) | ORAL | Status: DC | PRN

## 2020-10-23 NOTE — ED Provider Notes (Addendum)
Emergency Medicine Provider Triage Evaluation Note  Kayla [name] , a 70 y.o. female  was evaluated in triage.  Pt complains of persistent shortness of breath.  Patient states that in March of this year she began experiencing shortness of breath reports a productive cough with yellow sputum.  She has a history of multiple sclerosis.  She states that she was admitted for possible aspiration pneumonia in early July but since being discharged her symptoms have continued.  She recently had a barium swallow study showing no aspiration.  She states she followed up with her pulmonologist today who recommended that she come to the emergency department due to her symptoms.  Reports chest pain when coughing.  No nausea, vomiting, diarrhea.  She has vaccinated for COVID-19 x4 and has had multiple negative home test in the past few days.  Physical Exam  BP (!) 150/96 (BP Location: Right Arm)   Pulse 88   Temp 100.1 F (37.8 C) (Oral)   Resp (!) 22   Ht 5' 2.75" (1.594 m)   Wt 53.7 kg   SpO2 98%   BMI 21.14 kg/m  Gen:   Awake, no distress   Resp:  Normal effort  MSK:   Moves extremities without difficulty  Other:  1+ pitting edema noted in the lower extremities.   Medical Decision Making  Medically screening exam initiated at 12:32 PM.  Appropriate orders placed.  [name] was informed that the remainder of the evaluation will be completed by another provider, this initial triage assessment does not replace that evaluation, and the importance of remaining in the ED until their evaluation is complete.   Rayna Sexton, PA-C 10/23/20 1234    Rayna Sexton, PA-C 10/23/20 1423    Regan Lemming, MD 10/24/20 [phone]

## 2020-10-23 NOTE — ED Notes (Signed)
Brought pt snack and water

## 2020-10-23 NOTE — H&P (Signed)
History and Physical    Kayla [name] [id number] DOB: 25-Sep-1950 DOA: 10/23/2020  PCP: Janith Lima, MD (Confirm with patient/family/NH records and if not entered, this has to be entered at [facility] point of entry) Patient coming from: Home  I have personally briefly reviewed patient's old medical records in [place]  Chief Complaint: Worsening of cough  HPI: Kayla [name] is a 70 y.o. female with medical history significant of MS with bilateral vocal cord paralysis, COPD, GERD, hypothyroidism, presented with worsening of dry cough.  Patient was recently hospitalized for persistent cough and fever was found to have aspiration pneumonia multifocal.  Patient was referred to see outpatient speech evaluation and underwent barium swallow last week which showed a preserved swallow function and recommended regular diet and thin liquids.  Patient reported she has no signs of choking or cough after eat or drink.  She has chronic dry mouth which affecting her swallowing but reports no choking.  Significantly, she reported she has had poorly controlled GERD, she feels reflux after meals often.  She has been taking PPI but no significant improvement.  Last night, patient has had strenuous cough through the night and slept only 1 hour.  Denied any fever chills or chest pains.  She is scheduled to see GI in 2 weeks.  ED Course: Chest x-ray again showed multifocal pneumonia more on the left side, patient has no fever or hypoxia.  No WBC count.  Parents was given vancomycin and cefepime in the ED.  Review of Systems: As per HPI otherwise 14 point review of systems negative.    Past Medical History:  Diagnosis Date   Arthritis    oa   Cervical cancer ([place]) 2005   COPD (chronic obstructive pulmonary disease) ([place])    Multiple sclerosis ([place])    Osteoporosis    Other diseases of vocal cords    vocal cords are not even   Other voice and resonance disorders    Pneumonia 2014, 1992   Sleep  apnea    no cpap used   Unspecified hypothyroidism     Past Surgical History:  Procedure Laterality Date   ABDOMINAL HYSTERECTOMY  03/25/2003   for cervical cancer, complete   CATARACT EXTRACTION, BILATERAL Bilateral    lens for cataracts   CONVERSION TO TOTAL HIP Right 09/18/2015   Procedure: CONVERSION OF RIGHT HEMI ARTHROPLASTY TO TOTAL HIP ARTHROPLASTY ACETABULAR REVISON ;  Surgeon: Paralee Cancel, MD;  Location: [facility];  Service: Orthopedics;  Laterality: Right;   HIP ARTHROPLASTY Right 03/01/2013   Procedure: ARTHROPLASTY BIPOLAR HIP;  Surgeon: Mauri Pole, MD;  Location: [facility];  Service: Orthopedics;  Laterality: Right;   HIP CLOSED REDUCTION Right 03/12/2013   Procedure: CLOSED REDUCTION HIP;  Surgeon: Gearlean Alf, MD;  Location: [place];  Service: Orthopedics;  Laterality: Right;   HIP CLOSED REDUCTION Right 03/19/2013   Procedure: CLOSED REDUCTION HIP;  Surgeon: Johnn Hai, MD;  Location: [place];  Service: Orthopedics;  Laterality: Right;   HIP CLOSED REDUCTION Right 11/10/2015   Procedure: CLOSED MANIPULATION HIP;  Surgeon: Rod Can, MD;  Location: [facility];  Service: Orthopedics;  Laterality: Right;   HIP CLOSED REDUCTION Right 06/05/2017   Procedure: CLOSED REDUCTION HIP;  Surgeon: [name], MD;  Location: [facility];  Service: Orthopedics;  Laterality: Right;   TOTAL HIP REVISION Right 06/06/2017   Procedure: TOTAL HIP REVISION;  Surgeon: [name], MD;  Location: [facility];  Service: Orthopedics;  Laterality:  Right;   VESICOVAGINAL FISTULA CLOSURE W/ TAH  03/25/2003     reports that she has never smoked. She has never used smokeless tobacco. She reports current alcohol use of about 2.0 standard drinks of alcohol per week. She reports that she does not use drugs.  Allergies  Allergen Reactions   Tramadol     hallucinations    Contrast Media [Iodinated Diagnostic Agents] Rash   Iohexol Hives   Methylprednisolone Other (See Comments)    Reaction:   Decreases pts heart rate    Morphine And Related Other (See Comments)    Reaction:  Hallucinations    Oxycodone-Acetaminophen Rash and Other (See Comments)    Reaction:  Hallucinations    Phenytoin Sodium Extended Rash   Zanaflex [Tizanidine Hydrochloride] Rash    Family History  Problem Relation Age of Onset   Other Mother        COVID   Colon cancer Father    Atrial fibrillation Brother    Heart attack Maternal Grandfather    Parkinson's disease Paternal Grandfather      Prior to Admission medications   Medication Sig Start Date End Date Taking? Authorizing Provider  acetaminophen (TYLENOL) 500 MG tablet Take 500 mg by mouth every 6 (six) hours as needed for mild pain, fever or headache.   Yes [provider]  amphetamine-dextroamphetamine (ADDERALL) 20 MG tablet Take 20 mg by mouth daily.   Yes [provider]  baclofen (LIORESAL) 20 MG tablet Take 20 mg by mouth 3 (three) times daily as needed for muscle spasms.   Yes Love, Ivan Anchors, PA-C  CALCIUM PO Take 1 tablet by mouth daily.   Yes [provider]  dalfampridine 10 MG TB12 Take 10 mg by mouth 2 (two) times daily. 08/17/19  Yes [provider]  dexlansoprazole (DEXILANT) 60 MG capsule TAKE 1 CAPSULE BY MOUTH  ONCE DAILY WITH BREAKFAST Patient taking differently: Take 60 mg by mouth daily. 06/06/20  Yes Tanda Rockers, MD  famotidine (PEPCID) 20 MG tablet One after supper Patient taking differently: Take 20 mg by mouth at bedtime. 06/07/20  Yes Tanda Rockers, MD  FLUoxetine (PROZAC) 40 MG capsule Take 40 mg by mouth daily.   Yes [provider]  furosemide (LASIX) 20 MG tablet TAKE 1 TABLET BY MOUTH  DAILY Patient taking differently: Take 20 mg by mouth daily. 05/07/20  Yes Tanda Rockers, MD  gabapentin (NEURONTIN) 600 MG tablet Take 1 tablet (600 mg total) by mouth 3 (three) times daily. Patient taking differently: Take 600 mg by mouth daily. 07/18/20  Yes Tanda Rockers, MD   levothyroxine (SYNTHROID) 100 MCG tablet TAKE 1 TABLET BY MOUTH ONCE DAILY BEFORE BREAKFAST Patient taking differently: Take 100 mcg by mouth daily before breakfast. 12/07/19  Yes Tanda Rockers, MD  mometasone-formoterol (DULERA) 100-5 MCG/ACT AERO USE 2 PUFFS EVERY 12 HOURS. Patient taking differently: Inhale 2 puffs into the lungs 2 (two) times daily. 03/01/20  Yes Tanda Rockers, MD  Multiple Vitamin (MULTIVITAMIN WITH MINERALS) TABS tablet Take 1 tablet by mouth daily.   Yes [provider]  MYRBETRIQ 25 MG TB24 tablet Take 25 mg by mouth daily. 08/03/18  Yes [provider]  ocrelizumab (OCREVUS) 300 MG/10ML injection Inject 300 mg into the vein every 6 (six) months.    Yes [provider]  pilocarpine (SALAGEN) 7.5 MG tablet Take 7.5 mg by mouth 2 (two) times daily. 09/04/20  Yes [provider]  Probiotic Product (  PROBIOTIC PO) Take 1 capsule by mouth daily. Harmony probiotic   Yes [provider]  tamsulosin (FLOMAX) 0.4 MG CAPS capsule Take 0.4 mg by mouth daily.   Yes [provider]  temazepam (RESTORIL) 15 MG capsule Take 15 mg by mouth at bedtime.   Yes [provider]  VENTOLIN HFA 108 (90 Base) MCG/ACT inhaler INHALE 2 PUFFS INTO LUNGS EVERY 6 HOURS AS NEEDED. Patient taking differently: Inhale 2 puffs into the lungs every 6 (six) hours as needed for wheezing or shortness of breath. 10/21/20  Yes Tanda Rockers, MD  Respiratory Therapy Supplies (FLUTTER) DEVI Use as directed 06/02/17   Tanda Rockers, MD    Physical Exam: Vitals:   10/23/20 1521 10/23/20 1524 10/23/20 1600 10/23/20 1723  BP: (!) 111/53  111/67 104/82  Pulse: 77  75 73  Resp: '18  16 17  '$ Temp:  99.4 F (37.4 C)    TempSrc:  Oral    SpO2: 99%  98% 96%  Weight:      Height:        Constitutional: NAD, calm, comfortable Vitals:   10/23/20 1521 10/23/20 1524 10/23/20 1600 10/23/20 1723  BP: (!) 111/53  111/67 104/82  Pulse: 77  75 73  Resp: '18   16 17  '$ Temp:  99.4 F (37.4 C)    TempSrc:  Oral    SpO2: 99%  98% 96%  Weight:      Height:       Eyes: PERRL, lids and conjunctivae normal ENMT: Mucous membranes are moist. Posterior pharynx clear of any exudate or lesions.Normal dentition.  Neck: normal, supple, no masses, no thyromegaly Respiratory: clear to auscultation bilaterally, scattered wheezing wheezing, coarse crackles left> right. Normal respiratory effort. No accessory muscle use.  Cardiovascular: Regular rate and rhythm, no murmurs / rubs / gallops. No extremity edema. 2+ pedal pulses. No carotid bruits.  Abdomen: no tenderness, no masses palpated. No hepatosplenomegaly. Bowel sounds positive.  Musculoskeletal: no clubbing / cyanosis. No joint deformity upper and lower extremities. Good ROM, no contractures. Normal muscle tone.  Skin: no rashes, lesions, ulcers. No induration Neurologic: CN 2-12 grossly intact. Sensation intact, DTR normal. Strength 5/5 in all 4.  Psychiatric: Normal judgment and insight. Alert and oriented x 3. Normal mood.     Labs on Admission: I have personally reviewed following labs and imaging studies  CBC: Recent Labs  Lab 10/23/20 1328  WBC 11.8*  NEUTROABS 8.7*  HGB 9.7*  HCT 31.0*  MCV 86.4  PLT [id number]*   Basic Metabolic Panel: Recent Labs  Lab 10/23/20 1328  NA 133*  K 3.5  CL 97*  CO2 25  GLUCOSE 87  BUN 10  CREATININE 0.58  CALCIUM 9.0   GFR: Estimated Creatinine Clearance: 55 mL/min (by C-G formula based on SCr of 0.58 mg/dL). Liver Function Tests: Recent Labs  Lab 10/23/20 1328  AST 20  ALT 15  ALKPHOS 84  BILITOT 0.4  PROT 6.8  ALBUMIN 3.5   No results for input(s): LIPASE, AMYLASE in the last 168 hours. No results for input(s): AMMONIA in the last 168 hours. Coagulation Profile: No results for input(s): INR, PROTIME in the last 168 hours. Cardiac Enzymes: No results for input(s): CKTOTAL, CKMB, CKMBINDEX, TROPONINI in the last 168 hours. BNP (last 3  results) No results for input(s): PROBNP in the last 8760 hours. HbA1C: No results for input(s): HGBA1C in the last 72 hours. CBG: No results for input(s): GLUCAP in the last 168  hours. Lipid Profile: No results for input(s): CHOL, HDL, LDLCALC, TRIG, CHOLHDL, LDLDIRECT in the last 72 hours. Thyroid Function Tests: No results for input(s): TSH, T4TOTAL, FREET4, T3FREE, THYROIDAB in the last 72 hours. Anemia Panel: No results for input(s): VITAMINB12, FOLATE, FERRITIN, TIBC, IRON, RETICCTPCT in the last 72 hours. Urine analysis:    Component Value Date/Time   COLORURINE YELLOW 11/04/2018 [place] 11/04/2018 1647   LABSPEC 1.015 11/04/2018 1647   PHURINE 7.0 11/04/2018 1647   GLUCOSEU NEGATIVE 11/04/2018 1647   HGBUR NEGATIVE 11/04/2018 1647   BILIRUBINUR NEGATIVE 11/04/2018 1647   KETONESUR NEGATIVE 11/04/2018 1647   PROTEINUR NEGATIVE 11/07/2016 2359   UROBILINOGEN 0.2 11/04/2018 1647   NITRITE NEGATIVE 11/04/2018 [place] 11/04/2018 1647    Radiological Exams on Admission: DG Chest Portable 1 View  Result Date: 10/23/2020 CLINICAL DATA:  Chest pain with cough and shortness of breath. EXAM: PORTABLE CHEST 1 VIEW COMPARISON:  Radiographs 09/27/2020 and 07/18/2020.  CT 09/27/2020. FINDINGS: 1234 hours. The heart size and mediastinal contours are stable. There are progressive airspace opacities at the left lung base and left perihilar region. Minimal patchy opacity at the right lung base appears improved. There is no significant pleural effusion or pneumothorax. Old fracture the proximal right humerus appears unchanged. IMPRESSION: Progressive multifocal left lung airspace opacities consistent with progressive multilobar pneumonia. Right basilar involvement appears mildly improved in the interval. Electronically Signed   By: Richardean Sale M.D.   On: 10/23/2020 13:15    EKG: Independently reviewed.  Sinus, no acute ST-T  changes.  Assessment/Plan Active Problems:   Aspiration pneumonia (HCC)   Aspiration into airway  (please populate well all problems here in Problem List. (For example, if patient is on BP meds at home and you resume or decide to hold them, it is a problem that needs to be her. Same for CAD, COPD, HLD and so on)  Recurrent aspiration pneumonia. -Reviewed chest x-ray shows multifocal pneumonia dominantly on the left side.  Patient reported that she does cough more when lying flat.  And she has been sitting on the recliner to sleep for the last few nights.  And she has a chronic right hip pain, and she tend to lean on her left side when sleeping.  Along with severe poorly controlled GERD, makes her at increasing risk of aspiration to the left lung.  Discussed with patient and her husband at bedside with various options, I proposed to increase her PPI to twice daily and treat aspiration pneumonia and she can still follow-up GI as outpatient.  Patient expressed understanding and agreed. -Barium swallow was done last week, will not repeat. -Increase her cough medications, guaifenesin and Tessalon, expect discharge within 24 hours.  Acute COPD exacerbation -Short course p.o. steroid -Breathing treatment  MS with bilateral vocal cord paralysis -Stable.  Dry mouth -SLE and Sjogren syndrome serology negative.  DVT prophylaxis: Lovenox Code Status: Full code Family Communication: Husband at bedside Disposition Plan: Expect less than 2 midnight hospital stay Consults called: None Admission status: MedSurg observation.   [name] MD [facility] Pager [phone]  10/23/2020, 5:50 PM

## 2020-10-23 NOTE — ED Provider Notes (Signed)
[facility] DEPT Provider Note   CSN: [id number] Arrival date & time: 10/23/20  1219     History Chief Complaint  Patient presents with   Cough   Shortness of Breath    Kayla [name] is a 70 y.o. female.   Cough Associated symptoms: shortness of breath   Shortness of Breath Associated symptoms: cough    Patient presents to the ED for progressive shortness of breath and cough.  Patient states she was having trouble with cough for several months.  She also been having shortness of breath and cough productive yellow sputum.  Last month she was admitted to the hospital and diagnosed with pneumonia.  There was question of possible aspiration pneumonia and she had outpatient follow-up barium swallow study.  There were no signs of aspiration noted on the study.  Patient is post to have an endoscopy because she had some chronic issues with swallowing.  She was also referred to pulmonology but she does not have an appointment until the eighth.  Patient continues to have significant and severe coughing.  Last night it was worse.  Husband thought she was going to stop breathing from all of her coughing.  She has not had any vomiting or diarrhea.  She has done several negative home COVID test.  Past Medical History:  Diagnosis Date   Arthritis    oa   Cervical cancer ([place]) 2005   COPD (chronic obstructive pulmonary disease) ([place])    Multiple sclerosis (H[facility])    Osteoporosis    Other diseases of vocal cords    vocal cords are not even   Other voice and resonance disorders    Pneumonia 2014, 1992   Sleep apnea    no cpap used   Unspecified hypothyroidism     Patient Active Problem List   Diagnosis Date Noted   Pulmonary infiltrate present on computed tomography    Multifocal pneumonia 09/27/2020   Purple toe syndrome of both feet ([place]) 07/26/2020   Sternal fracture 09/08/2019   Chest wall pain 08/19/2019   Abrasion of leg with infection, right,  subsequent encounter 04/27/2019   Ankle pain, right 08/11/2017   Hypotension 06/07/2017   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/05/2017   Cough variant asthma vs vcd  related to MS  11/10/2016   Positional sleep apnea 09/10/2016   Periodic limb movements of sleep 09/10/2016   OSA on CPAP 02/08/2016   S/P revision right TH 09/18/2015   Leg swelling 08/21/2014   Acute URI 04/04/2014   Health care maintenance 12/16/2013   Hyperlipidemia 11/04/2013   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/21/2013   S/P hip hemiarthroplasty 03/15/2013   Dislocation of hip prosthesis ([place]) 03/12/2013   Hip dislocation, left ([place]) 03/12/2013   Hip dislocation, right ([place]) 03/12/2013   Urinary retention with incomplete bladder emptying 03/09/2013   Dysphagia, pharyngoesophageal phase 03/09/2013   Femoral neck fracture ([place]) 03/08/2013   Unspecified constipation 02/26/2013   Fractured femoral neck ([place]) 02/23/2013   Hip fracture ([place]) 02/23/2013   Excessive flatus 10/21/2012   Cold feet 07/29/2012   Hyponatremia 05/20/2012   Calf pain 11/17/2011   DOE (dyspnea on exertion) 12/06/2007   Post-menopausal osteoporosis 09/17/2007   Hypothyroidism 04/01/2007   Multiple sclerosis ([place]) 04/01/2007   Other diseases of vocal cords 04/01/2007    Past Surgical History:  Procedure Laterality Date   ABDOMINAL HYSTERECTOMY  03/25/2003   for cervical cancer, complete   CATARACT EXTRACTION, BILATERAL Bilateral    lens for  cataracts   CONVERSION TO TOTAL HIP Right 09/18/2015   Procedure: CONVERSION OF RIGHT HEMI ARTHROPLASTY TO TOTAL HIP ARTHROPLASTY ACETABULAR REVISON ;  Surgeon: Paralee Cancel, MD;  Location: [facility];  Service: Orthopedics;  Laterality: Right;   HIP ARTHROPLASTY Right 03/01/2013   Procedure: ARTHROPLASTY BIPOLAR HIP;  Surgeon: Mauri Pole, MD;  Location: [facility];  Service: Orthopedics;  Laterality: Right;   HIP CLOSED REDUCTION Right 03/12/2013   Procedure: CLOSED REDUCTION HIP;  Surgeon: Gearlean Alf, MD;  Location: [facility];  Service: Orthopedics;  Laterality: Right;   HIP CLOSED REDUCTION Right 03/19/2013   Procedure: CLOSED REDUCTION HIP;  Surgeon: Johnn Hai, MD;  Location: [facility];  Service: Orthopedics;  Laterality: Right;   HIP CLOSED REDUCTION Right 11/10/2015   Procedure: CLOSED MANIPULATION HIP;  Surgeon: Rod Can, MD;  Location: [facility];  Service: Orthopedics;  Laterality: Right;   HIP CLOSED REDUCTION Right 06/05/2017   Procedure: CLOSED REDUCTION HIP;  Surgeon: [name], MD;  Location: [facility];  Service: Orthopedics;  Laterality: Right;   TOTAL HIP REVISION Right 06/06/2017   Procedure: TOTAL HIP REVISION;  Surgeon: [name], MD;  Location: [facility];  Service: Orthopedics;  Laterality: Right;   VESICOVAGINAL FISTULA CLOSURE W/ TAH  03/25/2003     OB History   No obstetric history on file.     Family History  Problem Relation Age of Onset   Other Mother        COVID   Colon cancer Father    Atrial fibrillation Brother    Heart attack Maternal Grandfather    Parkinson's disease Paternal Grandfather     Social History   Tobacco Use   Smoking status: Never   Smokeless tobacco: Never  Vaping Use   Vaping Use: Never used  Substance Use Topics   Alcohol use: Yes    Alcohol/week: 2.0 standard drinks    Types: 1 Glasses of wine, 1 Cans of beer per week    Comment: 2x/week    Drug use: No    Home Medications Prior to Admission medications   Medication Sig Start Date End Date Taking? Authorizing Provider  acetaminophen (TYLENOL) 500 MG tablet Take 1,000 mg by mouth every 6 (six) hours as needed for mild pain, fever or headache.    [provider]  amphetamine-dextroamphetamine (ADDERALL) 20 MG tablet Take 20 mg by mouth daily.    [provider]  baclofen (LIORESAL) 20 MG tablet Take 20 mg by mouth 3 (three) times daily.     Love, Pamela S, PA-C  CALCIUM PO Take 1 tablet by mouth daily.    [provider]   dalfampridine 10 MG TB12 Take 10 mg by mouth 2 (two) times daily. 08/17/19   [provider]  dexlansoprazole (DEXILANT) 60 MG capsule TAKE 1 CAPSULE BY MOUTH  ONCE DAILY WITH BREAKFAST 06/06/20   Tanda Rockers, MD  famotidine (PEPCID) 20 MG tablet One after supper 06/07/20   Tanda Rockers, MD  FLUoxetine (PROZAC) 40 MG capsule Take 40 mg by mouth daily.    [provider]  furosemide (LASIX) 20 MG tablet TAKE 1 TABLET BY MOUTH  DAILY 05/07/20   Tanda Rockers, MD  gabapentin (NEURONTIN) 600 MG tablet Take 1 tablet (600 mg total) by mouth 3 (three) times daily. 07/18/20   Tanda Rockers, MD  levothyroxine (SYNTHROID) 100 MCG tablet TAKE 1 TABLET BY MOUTH ONCE DAILY BEFORE BREAKFAST 12/07/19   Tanda Rockers,  MD  mometasone-formoterol (DULERA) 100-5 MCG/ACT AERO USE 2 PUFFS EVERY 12 HOURS. 03/01/20   Tanda Rockers, MD  Multiple Vitamin (MULTIVITAMIN WITH MINERALS) TABS tablet Take 1 tablet by mouth daily.    [provider]  MYRBETRIQ 25 MG TB24 tablet Take 25 mg by mouth daily. 08/03/18   [provider]  ocrelizumab (OCREVUS) 300 MG/10ML injection Inject 300 mg into the vein every 6 (six) months.     [provider]  pilocarpine (SALAGEN) 7.5 MG tablet Take 7.5 mg by mouth 3 (three) times daily. 09/04/20   [provider]  Probiotic Product (PROBIOTIC PO) Take 1 capsule by mouth daily. Harmony probiotic    [provider]  Respiratory Therapy Supplies (FLUTTER) DEVI Use as directed 06/02/17   Tanda Rockers, MD  tamsulosin (FLOMAX) 0.4 MG CAPS capsule Take 0.4 mg by mouth daily.    [provider]  temazepam (RESTORIL) 15 MG capsule Take 15 mg by mouth at bedtime.    [provider]  VENTOLIN HFA 108 (90 Base) MCG/ACT inhaler INHALE 2 PUFFS INTO LUNGS EVERY 6 HOURS AS NEEDED. 10/21/20   Tanda Rockers, MD    Allergies    Tramadol, Contrast media [iodinated diagnostic agents], Iohexol, Methylprednisolone, Morphine  and related, Oxycodone-acetaminophen, Phenytoin sodium extended, and Zanaflex [tizanidine hydrochloride]  Review of Systems   Review of Systems  Respiratory:  Positive for cough and shortness of breath.   All other systems reviewed and are negative.  Physical Exam Updated Vital Signs BP (!) 111/53   Pulse 77   Temp 99.4 F (37.4 C) (Oral)   Resp 18   Ht 1.594 m (5' 2.75")   Wt 53.7 kg   SpO2 99%   BMI 21.14 kg/m   Physical Exam Vitals and nursing note reviewed.  Constitutional:      General: She is not in acute distress.    Appearance: She is well-developed.  HENT:     Head: Normocephalic and atraumatic.     Right Ear: External ear normal.     Left Ear: External ear normal.  Eyes:     General: No scleral icterus.       Right eye: No discharge.        Left eye: No discharge.     Conjunctiva/sclera: Conjunctivae normal.  Neck:     Trachea: No tracheal deviation.  Cardiovascular:     Rate and Rhythm: Normal rate and regular rhythm.  Pulmonary:     Effort: Pulmonary effort is normal. No respiratory distress.     Breath sounds: No stridor. Rhonchi present. No wheezing or rales.     Comments: Slight stridor noted intermittently, patient notes this is chronic she has vocal cord issues Abdominal:     General: Bowel sounds are normal. There is no distension.     Palpations: Abdomen is soft.     Tenderness: There is no abdominal tenderness. There is no guarding or rebound.  Musculoskeletal:        General: No tenderness or deformity.     Cervical back: Neck supple.  Skin:    General: Skin is warm and dry.     Findings: No rash.  Neurological:     General: No focal deficit present.     Mental Status: She is alert.     Cranial Nerves: No cranial nerve deficit (no facial droop, extraocular movements intact, no slurred speech).     Sensory: No sensory deficit.     Motor: No abnormal  muscle tone or seizure activity.     Coordination: Coordination normal.  Psychiatric:         Mood and Affect: Mood normal.    ED Results / Procedures / Treatments   Labs (all labs ordered are listed, but only abnormal results are displayed) Labs Reviewed  COMPREHENSIVE METABOLIC PANEL - Abnormal; Notable for the following components:      Result Value   Sodium 133 (*)    Chloride 97 (*)    All other components within normal limits  CBC WITH DIFFERENTIAL/PLATELET - Abnormal; Notable for the following components:   WBC 11.8 (*)    RBC 3.59 (*)    Hemoglobin 9.7 (*)    HCT 31.0 (*)    Platelets 495 (*)    Neutro Abs 8.7 (*)    Monocytes Absolute 1.4 (*)    Abs Immature Granulocytes 0.08 (*)    All other components within normal limits  RESP PANEL BY RT-PCR (FLU A&B, COVID) ARPGX2  CULTURE, BLOOD (ROUTINE X 2)  CULTURE, BLOOD (ROUTINE X 2)  LACTIC ACID, PLASMA  BRAIN NATRIURETIC PEPTIDE  LACTIC ACID, PLASMA    EKG EKG Interpretation  Date/Time:  Tuesday October 23 2020 12:45:55 EDT Ventricular Rate:  81 PR Interval:  120 QRS Duration: 93 QT Interval:  361 QTC Calculation: 419 R Axis:   87 Text Interpretation: Sinus rhythm Borderline right axis deviation 12 Lead; [place] No significant change since last tracing Confirmed by Dorie Rank [phone]) on 10/23/2020 1:26:18 PM  Radiology DG Chest Portable 1 View  Result Date: 10/23/2020 CLINICAL DATA:  Chest pain with cough and shortness of breath. EXAM: PORTABLE CHEST 1 VIEW COMPARISON:  Radiographs 09/27/2020 and 07/18/2020.  CT 09/27/2020. FINDINGS: 1234 hours. The heart size and mediastinal contours are stable. There are progressive airspace opacities at the left lung base and left perihilar region. Minimal patchy opacity at the right lung base appears improved. There is no significant pleural effusion or pneumothorax. Old fracture the proximal right humerus appears unchanged. IMPRESSION: Progressive multifocal left lung airspace opacities consistent with progressive multilobar pneumonia. Right basilar involvement  appears mildly improved in the interval. Electronically Signed   By: Richardean Sale M.D.   On: 10/23/2020 13:15    Procedures Procedures   Medications Ordered in ED Medications  ceFEPIme (MAXIPIME) 2 g in sodium chloride 0.9 % 100 mL IVPB (2 g Intravenous New Bag/Given 10/23/20 1522)  vancomycin (VANCOCIN) IVPB 1000 mg/200 mL premix (0 mg Intravenous Stopped 10/23/20 1522)    ED Course  I have reviewed the triage vital signs and the nursing notes.  Pertinent labs & imaging results that were available during my care of the patient were reviewed by me and considered in my medical decision making (see chart for details).  Clinical Course as of 10/23/20 1532  Tue Oct 23, 2020  1335  x-ray shows findings suggestive of progressive multifocal lung disease [JK]  1507 Patient's laboratory test shows elevation white count [JK]  1507 Hemoglobin is stable.  Metabolic panel is unremarkable [JK]  1507 Covid and flu are negative. [JK]    Clinical Course User Index [JK] Dorie Rank, MD   MDM Rules/Calculators/A&P                           Patient presented to the ED for persistent issues with shortness of breath and cough.  Patient had low-grade temperatures.  ED work-up is notable for chest x-ray showing diffuse infiltrates.  Patient is afebrile.  No signs of sepsis.  With her worsening pneumonia have started on IV antibiotics.  I will consult with the medical service for admission.  Patient may need to see pulmonary Final Clinical Impression(s) / ED Diagnoses Final diagnoses:  Multifocal pneumonia     Dorie Rank, MD 10/23/20 [phone]

## 2020-10-23 NOTE — Plan of Care (Signed)
Pt arrived on the unit, A/O, VS stable, using walker to ambulate. Fanily and belongings at the bedside. Bed in low position, alarms are on, call bell in reach

## 2020-10-23 NOTE — Progress Notes (Signed)
A consult was received from an ED physician for vancomycin per pharmacy dosing.  [name] profile has been reviewed for ht/wt/allergies/indication/available labs.    A one time order has been placed for vancomycin 1000 mg.    Further antibiotics/pharmacy consults should be ordered by admitting physician if indicated.                       Thank you, Lenis Noon, PharmD 10/23/2020  1:55 PM

## 2020-10-23 NOTE — Progress Notes (Signed)
Pharmacy Antibiotic Note  Kayla [name] is a 70 y.o. female admitted on 10/23/2020 with aspiration pneumonia.  Pharmacy has been consulted for unasyn dosing.  Plan: Unasyn 3g IV q6 per current renal function Will sign off  Height: 5' 2.75" (159.4 cm) Weight: 53.7 kg (118 lb 6 oz) IBW/kg (Calculated) : 51.83  Temp (24hrs), Avg:99.8 F (37.7 C), Min:99.4 F (37.4 C), Max:100.1 F (37.8 C)  Recent Labs  Lab 10/23/20 1328 10/23/20 1409  WBC 11.8*  --   CREATININE 0.58  --   LATICACIDVEN  --  0.8    Estimated Creatinine Clearance: 55 mL/min (by C-G formula based on SCr of 0.58 mg/dL).    Allergies  Allergen Reactions   Tramadol     hallucinations    Contrast Media [Iodinated Diagnostic Agents] Rash   Iohexol Hives   Methylprednisolone Other (See Comments)    Reaction:  Decreases pts heart rate    Morphine And Related Other (See Comments)    Reaction:  Hallucinations    Oxycodone-Acetaminophen Rash and Other (See Comments)    Reaction:  Hallucinations    Phenytoin Sodium Extended Rash   Zanaflex [Tizanidine Hydrochloride] Rash    Thank you for allowing pharmacy to be a part of this patient's care.  Kara Mead 10/23/2020 5:52 PM

## 2020-10-23 NOTE — ED Triage Notes (Addendum)
Patient c/o SOB and a cough "since March." Patient also reports that she received Prednisone in April and states that she has never gotten any better. Patient also reports a productive cough with yellow sputum and states this has not changed since March.  Patient has swelling in her feet and states this is not new.

## 2020-10-24 ENCOUNTER — Inpatient Hospital Stay (HOSPITAL_COMMUNITY): Payer: Medicare Other

## 2020-10-24 DIAGNOSIS — Z79899 Other long term (current) drug therapy: Secondary | ICD-10-CM | POA: Diagnosis not present

## 2020-10-24 DIAGNOSIS — Z961 Presence of intraocular lens: Secondary | ICD-10-CM | POA: Diagnosis present

## 2020-10-24 DIAGNOSIS — E785 Hyperlipidemia, unspecified: Secondary | ICD-10-CM | POA: Diagnosis present

## 2020-10-24 DIAGNOSIS — Z20822 Contact with and (suspected) exposure to covid-19: Secondary | ICD-10-CM | POA: Diagnosis present

## 2020-10-24 DIAGNOSIS — K219 Gastro-esophageal reflux disease without esophagitis: Secondary | ICD-10-CM | POA: Diagnosis present

## 2020-10-24 DIAGNOSIS — Z91041 Radiographic dye allergy status: Secondary | ICD-10-CM | POA: Diagnosis not present

## 2020-10-24 DIAGNOSIS — J189 Pneumonia, unspecified organism: Secondary | ICD-10-CM | POA: Diagnosis not present

## 2020-10-24 DIAGNOSIS — R918 Other nonspecific abnormal finding of lung field: Secondary | ICD-10-CM

## 2020-10-24 DIAGNOSIS — T17908A Unspecified foreign body in respiratory tract, part unspecified causing other injury, initial encounter: Secondary | ICD-10-CM | POA: Diagnosis not present

## 2020-10-24 DIAGNOSIS — Z90710 Acquired absence of both cervix and uterus: Secondary | ICD-10-CM | POA: Diagnosis not present

## 2020-10-24 DIAGNOSIS — D649 Anemia, unspecified: Secondary | ICD-10-CM | POA: Diagnosis present

## 2020-10-24 DIAGNOSIS — R053 Chronic cough: Secondary | ICD-10-CM

## 2020-10-24 DIAGNOSIS — Z9842 Cataract extraction status, left eye: Secondary | ICD-10-CM | POA: Diagnosis not present

## 2020-10-24 DIAGNOSIS — R682 Dry mouth, unspecified: Secondary | ICD-10-CM | POA: Diagnosis present

## 2020-10-24 DIAGNOSIS — T17908D Unspecified foreign body in respiratory tract, part unspecified causing other injury, subsequent encounter: Secondary | ICD-10-CM

## 2020-10-24 DIAGNOSIS — J9809 Other diseases of bronchus, not elsewhere classified: Secondary | ICD-10-CM

## 2020-10-24 DIAGNOSIS — Z96641 Presence of right artificial hip joint: Secondary | ICD-10-CM | POA: Diagnosis present

## 2020-10-24 DIAGNOSIS — E039 Hypothyroidism, unspecified: Secondary | ICD-10-CM | POA: Diagnosis present

## 2020-10-24 DIAGNOSIS — Z8541 Personal history of malignant neoplasm of cervix uteri: Secondary | ICD-10-CM | POA: Diagnosis not present

## 2020-10-24 DIAGNOSIS — I700 Atherosclerosis of aorta: Secondary | ICD-10-CM | POA: Diagnosis not present

## 2020-10-24 DIAGNOSIS — Z79890 Hormone replacement therapy (postmenopausal): Secondary | ICD-10-CM | POA: Diagnosis not present

## 2020-10-24 DIAGNOSIS — J3802 Paralysis of vocal cords and larynx, bilateral: Secondary | ICD-10-CM | POA: Diagnosis present

## 2020-10-24 DIAGNOSIS — J690 Pneumonitis due to inhalation of food and vomit: Secondary | ICD-10-CM | POA: Diagnosis present

## 2020-10-24 DIAGNOSIS — M810 Age-related osteoporosis without current pathological fracture: Secondary | ICD-10-CM | POA: Diagnosis present

## 2020-10-24 DIAGNOSIS — Z888 Allergy status to other drugs, medicaments and biological substances status: Secondary | ICD-10-CM | POA: Diagnosis not present

## 2020-10-24 DIAGNOSIS — Z9841 Cataract extraction status, right eye: Secondary | ICD-10-CM | POA: Diagnosis not present

## 2020-10-24 DIAGNOSIS — G35 Multiple sclerosis: Secondary | ICD-10-CM | POA: Diagnosis present

## 2020-10-24 DIAGNOSIS — Z885 Allergy status to narcotic agent status: Secondary | ICD-10-CM | POA: Diagnosis not present

## 2020-10-24 DIAGNOSIS — J449 Chronic obstructive pulmonary disease, unspecified: Secondary | ICD-10-CM | POA: Diagnosis present

## 2020-10-24 DIAGNOSIS — G4730 Sleep apnea, unspecified: Secondary | ICD-10-CM | POA: Diagnosis present

## 2020-10-24 DIAGNOSIS — J479 Bronchiectasis, uncomplicated: Secondary | ICD-10-CM | POA: Diagnosis not present

## 2020-10-24 LAB — CBC
HCT: 29.6 % — ABNORMAL LOW (ref 36.0–46.0)
Hemoglobin: 9.3 g/dL — ABNORMAL LOW (ref 12.0–15.0)
MCH: 27.3 pg (ref 26.0–34.0)
MCHC: 31.4 g/dL (ref 30.0–36.0)
MCV: 86.8 fL (ref 80.0–100.0)
Platelets: 462 10*3/uL — ABNORMAL HIGH (ref 150–400)
RBC: 3.41 MIL/uL — ABNORMAL LOW (ref 3.87–5.11)
RDW: 14.3 % (ref 11.5–15.5)
WBC: 10.3 10*3/uL (ref 4.0–10.5)
nRBC: 0 % (ref 0.0–0.2)

## 2020-10-24 LAB — PROCALCITONIN: Procalcitonin: <0.1 ng/mL

## 2020-10-24 LAB — SEDIMENTATION RATE: Sed Rate: 80 mm/hr — ABNORMAL HIGH (ref 0–22)

## 2020-10-24 MED ORDER — IPRATROPIUM-ALBUTEROL 0.5-2.5 (3) MG/3ML IN SOLN
3.0000 mL | Freq: Three times a day (TID) | RESPIRATORY_TRACT | Status: DC
  Administered 2020-10-24 – 2020-10-25 (×3): 3 mL via RESPIRATORY_TRACT
  Filled 2020-10-24 (×3): qty 3

## 2020-10-24 MED ORDER — PROMETHAZINE-CODEINE 6.25-10 MG/5ML PO SYRP: 5.0000 mL | ORAL_SOLUTION | Freq: Four times a day (QID) | ORAL | Status: DC | PRN

## 2020-10-24 MED ORDER — GUAIFENESIN 100 MG/5ML PO SOLN
10.0000 mL | ORAL | Status: DC | PRN
  Administered 2020-10-24 – 2020-10-25 (×2): 200 mg via ORAL
  Filled 2020-10-24: qty 20
  Filled 2020-10-24: qty 10

## 2020-10-24 NOTE — Progress Notes (Signed)
PROGRESS NOTE    KELIE WHITSETT  [id number] DOB: 08-29-1950 DOA: 10/23/2020 PCP: Janith Lima, MD    Brief Narrative:  70 year old female with history of multiple sclerosis, vocal cord paralysis, history of dysphagia and recently suffering from cough which is worse and progressively debilitating for last 4 months.  Multiple rounds of antibiotics and steroids.  Admitted last month in the hospital with multifocal pneumonia thought to be aspiration pneumonia.  Multiple investigations including autoimmune test negative presented back to the hospital with intractable cough and wheezing.   Assessment & Plan:   Active Problems:   Multifocal pneumonia   Aspiration into airway  Recurrent atypical pneumonia: Patient with multifocal pneumonia.  Currently without any high fever or WBC count.  Suspect aspiration pneumonitis vs autoimmune disease , lung tumor. Multiple investigations including Legionella, pneumococcal, blood cultures and sputum cultures pending. Patient currently on Unasyn and prednisone 20 mg daily. Recent barium swallow test with no obvious aspiration. Called and discussed with pulmonary, seen in consultation.  Possible bronc and biopsy tomorrow. Aggressive bronchodilator therapy, cough medications, mucolytic's, chest physiotherapy and mobility.  Multiple sclerosis vocal cord paralysis: Aspiration precautions.  Stable.  On pilocarpine continued.  Hypothyroidism: On Synthroid.   DVT prophylaxis: enoxaparin (LOVENOX) injection 40 mg Start: 10/23/20 1815   Code Status: Full code Family Communication: Husband at the bedside Disposition Plan: Status is: Inpatient  Remains inpatient appropriate because:Inpatient level of care appropriate due to severity of illness  Dispo: The patient is from: Home              Anticipated d/c is to: Home              Patient currently is not medically stable to d/c.   Difficult to place patient No         Consultants:   PCCM  Procedures:  None  Antimicrobials:  Unasyn 8/2---   Subjective: Patient seen and examined.  Husband was at the bedside.  I examined her multiple times today to discuss her care, x-ray findings and consultation reports. Patient had episodes of intractable cough and obvious wheezing secondary to her vocal cord paralysis. Patient is very frustrated due to debilitating cough and shortness of breath and not getting enough help.  Objective: Vitals:   10/24/20 0219 10/24/20 0620 10/24/20 0812 10/24/20 1453  BP: (!) 112/54 (!) 111/54  (!) 102/50  Pulse: 73 73  79  Resp: 18 (!) 21  20  Temp: 97.8 F (36.6 C) 98.9 F (37.2 C)  98.9 F (37.2 C)  TempSrc: Oral Oral  Oral  SpO2: 96% 99% 97% 98%  Weight:      Height:        Intake/Output Summary (Last 24 hours) at 10/24/2020 1641 Last data filed at 10/24/2020 0600 Gross per 24 hour  Intake 683.18 ml  Output --  Net 683.18 ml   Filed Weights   10/23/20 1230  Weight: 53.7 kg    Examination:  General exam: Appears anxious, moderate distress with ongoing nagging cough.  She has audible wheezing. Respiratory system: Both inspiratory and expiratory wheezes, audible wheezing from vocal cord paralysis.  She has some fine crackles all over the lung field and conducted upper airway sounds.  No other added sounds. Cardiovascular system: S1 & S2 heard, RRR. No JVD, murmurs, rubs, gallops or clicks. No pedal edema. Gastrointestinal system: Abdomen is nondistended, soft and nontender. No organomegaly or masses felt. Normal bowel sounds heard. Central nervous system: Alert and oriented. No focal neurological  deficits. Extremities: Symmetric 5 x 5 power. Skin: No rashes, lesions or ulcers Psychiatry: Judgement and insight appear normal.  Anxious.    Data Reviewed: I have personally reviewed following labs and imaging studies  CBC: Recent Labs  Lab 10/23/20 1328 10/24/20 0504  WBC 11.8* 10.3  NEUTROABS 8.7*  --   HGB 9.7* 9.3*   HCT 31.0* 29.6*  MCV 86.4 86.8  PLT 495* [id number]*   Basic Metabolic Panel: Recent Labs  Lab 10/23/20 1328  NA 133*  K 3.5  CL 97*  CO2 25  GLUCOSE 87  BUN 10  CREATININE 0.58  CALCIUM 9.0   GFR: Estimated Creatinine Clearance: 55 mL/min (by C-G formula based on SCr of 0.58 mg/dL). Liver Function Tests: Recent Labs  Lab 10/23/20 1328  AST 20  ALT 15  ALKPHOS 84  BILITOT 0.4  PROT 6.8  ALBUMIN 3.5   No results for input(s): LIPASE, AMYLASE in the last 168 hours. No results for input(s): AMMONIA in the last 168 hours. Coagulation Profile: No results for input(s): INR, PROTIME in the last 168 hours. Cardiac Enzymes: No results for input(s): CKTOTAL, CKMB, CKMBINDEX, TROPONINI in the last 168 hours. BNP (last 3 results) No results for input(s): PROBNP in the last 8760 hours. HbA1C: No results for input(s): HGBA1C in the last 72 hours. CBG: No results for input(s): GLUCAP in the last 168 hours. Lipid Profile: No results for input(s): CHOL, HDL, LDLCALC, TRIG, CHOLHDL, LDLDIRECT in the last 72 hours. Thyroid Function Tests: No results for input(s): TSH, T4TOTAL, FREET4, T3FREE, THYROIDAB in the last 72 hours. Anemia Panel: No results for input(s): VITAMINB12, FOLATE, FERRITIN, TIBC, IRON, RETICCTPCT in the last 72 hours. Sepsis Labs: Recent Labs  Lab 10/23/20 1409  LATICACIDVEN 0.8    Recent Results (from the past 240 hour(s))  Resp Panel by RT-PCR (Flu A&B, Covid) Nasopharyngeal Swab     Status: None   Collection Time: 10/23/20  1:28 PM   Specimen: Nasopharyngeal Swab; Nasopharyngeal(NP) swabs in vial transport medium  Result Value Ref Range Status   SARS Coronavirus 2 by RT PCR NEGATIVE NEGATIVE Final    Comment: (NOTE) SARS-CoV-2 target nucleic acids are NOT DETECTED.  The SARS-CoV-2 RNA is generally detectable in upper respiratory specimens during the acute phase of infection. The lowest concentration of SARS-CoV-2 viral copies this assay can detect  is 138 copies/mL. A negative result does not preclude SARS-Cov-2 infection and should not be used as the sole basis for treatment or other patient management decisions. A negative result may occur with  improper specimen collection/handling, submission of specimen other than nasopharyngeal swab, presence of viral mutation(s) within the areas targeted by this assay, and inadequate number of viral copies(<138 copies/mL). A negative result must be combined with clinical observations, patient history, and epidemiological information. The expected result is Negative.  Fact Sheet for Patients:  EntrepreneurPulse.com.au  Fact Sheet for Healthcare Providers:  IncredibleEmployment.be  This test is no t yet approved or cleared by the [place] FDA and  has been authorized for detection and/or diagnosis of SARS-CoV-2 by FDA under an Emergency Use Authorization (EUA). This EUA will remain  in effect (meaning this test can be used) for the duration of the COVID-19 declaration under Section 564(b)(1) of the Act, 21 U.S.C.section 360bbb-3(b)(1), unless the authorization is terminated  or revoked sooner.       Influenza A by PCR NEGATIVE NEGATIVE Final   Influenza B by PCR NEGATIVE NEGATIVE Final    Comment: (NOTE) The Xpert  Xpress SARS-CoV-2/FLU/RSV plus assay is intended as an aid in the diagnosis of influenza from Nasopharyngeal swab specimens and should not be used as a sole basis for treatment. Nasal washings and aspirates are unacceptable for Xpert Xpress SARS-CoV-2/FLU/RSV testing.  Fact Sheet for Patients: EntrepreneurPulse.com.au  Fact Sheet for Healthcare Providers: IncredibleEmployment.be  This test is not yet approved or cleared by the [place] FDA and has been authorized for detection and/or diagnosis of SARS-CoV-2 by FDA under an Emergency Use Authorization (EUA). This EUA will remain in effect  (meaning this test can be used) for the duration of the COVID-19 declaration under Section 564(b)(1) of the Act, 21 U.S.C. section 360bbb-3(b)(1), unless the authorization is terminated or revoked.  Performed at [facility], [place] [address] [place], [place] [address]   Blood culture (routine x 2)     Status: None (Preliminary result)   Collection Time: 10/23/20  2:07 PM   Specimen: BLOOD  Result Value Ref Range Status   Specimen Description   Final    BLOOD LEFT ANTECUBITAL Performed at [facility] [address] [place], [place] [address]    Special Requests   Final    BOTTLES DRAWN AEROBIC AND ANAEROBIC Blood Culture adequate volume Performed at [facility] [address], [place], [place] [address]    Culture   Final    NO GROWTH < 24 HOURS Performed at [facility] [address] [place], [place] [address]    Report Status PENDING  Incomplete  Blood culture (routine x 2)     Status: None (Preliminary result)   Collection Time: 10/23/20  2:07 PM   Specimen: BLOOD  Result Value Ref Range Status   Specimen Description   Final    BLOOD RIGHT ANTECUBITAL Performed at [facility] [address], [place], [place] [address]    Special Requests   Final    BOTTLES DRAWN AEROBIC AND ANAEROBIC Blood Culture adequate volume Performed at [facility] [address] [place], [place] [address]    Culture   Final    NO GROWTH < 24 HOURS Performed at [facility] [address] [place], [place] [address]    Report Status PENDING  Incomplete         Radiology Studies: DG Chest Portable 1 View  Result Date: 10/23/2020 CLINICAL DATA:  Chest pain with cough and shortness of breath. EXAM: PORTABLE CHEST 1 VIEW COMPARISON:  Radiographs 09/27/2020 and 07/18/2020.  CT 09/27/2020. FINDINGS: 1234 hours. The heart size and mediastinal contours are stable. There are  progressive airspace opacities at the left lung base and left perihilar region. Minimal patchy opacity at the right lung base appears improved. There is no significant pleural effusion or pneumothorax. Old fracture the proximal right humerus appears unchanged. IMPRESSION: Progressive multifocal left lung airspace opacities consistent with progressive multilobar pneumonia. Right basilar involvement appears mildly improved in the interval. Electronically Signed   By: Richardean Sale M.D.   On: 10/23/2020 13:15        Scheduled Meds:  acidophilus  1 capsule Oral Daily   benzonatate  200 mg Oral TID   dalfampridine  10 mg Oral BID   enoxaparin (LOVENOX) injection  40 mg Subcutaneous Daily   famotidine  20 mg Oral QHS   feeding supplement  237 mL Oral BID BM   FLUoxetine  40 mg Oral Daily   furosemide  20 mg Oral Daily  gabapentin  600 mg Oral Daily   ipratropium-albuterol  3 mL Nebulization TID   levothyroxine  100 mcg Oral Q0600   mirabegron ER  25 mg Oral Daily   mometasone-formoterol  2 puff Inhalation BID   pantoprazole  40 mg Oral BID AC   pilocarpine  7.5 mg Oral BID   predniSONE  20 mg Oral Q breakfast   tamsulosin  0.4 mg Oral Daily   temazepam  15 mg Oral QHS   Continuous Infusions:  ampicillin-sulbactam (UNASYN) IV 3 g (10/24/20 1547)     LOS: 0 days    Time spent: 32 minutes    Barb Merino, MD [facility] Pager [phone]

## 2020-10-24 NOTE — Progress Notes (Signed)
NIF -24 VC 1.76L Patient gave good effort with NIF. VC was more difficult to obtain due to her exhalation technique (puffing with short puffs). She states she cannot breath out long breaths and would not follow appropriate technique when instructed to do so.

## 2020-10-24 NOTE — Consult Note (Signed)
NAME:  Kayla [name], MRN:  [id number], DOB:  04/18/1950, LOS: 0 ADMISSION DATE:  10/23/2020, CONSULTATION DATE:  10/24/20 REFERRING MD:  Dr Norva Pavlov, CHIEF COMPLAINT:  chroic cough, bronchorrhea,    History of Present Illness:    70 year old female with chronic cough.  She is a longstanding patient of Dr. Christinia Gully.  Since [id number] she has had multiple sclerosis.  She had a hip fracture in 2004 and after that she has used a walker.  In 2006 she had a EGD with 3 cm hiatal hernia.  She is always had a stridorous noise with deep inspiration.  She has a 15-year history of dysphagia.  2008 at esophagogram was normal.  In May 2022 she saw Dr. Melida Quitter and was noticed to have bilateral vocal fold paralysis.  She was admitted 09/27/2020 with history of fever but also chronic cough for 4 months.  She had failed outpatient antibiotics and steroids.  CT chest showed some scattered groundglass opacities [personally visualized] particularly in the left lower lobe.  She had a blood gas 09/29/2020 without any hypercapnia.  She had a ESR of 44.  She had normal procalcitonin.  She had serology that was negative in July 2022.  During this time MRSA PCR, COVID and flu were negative.  Respiratory virus panel was also negative.  Blood cultures were no growth.  The urine Streptococcus and Legionella was negative in July 2022.  She was discharged on 09/29/2020 with outpatient follow-up with GI and pulmonary.  No antibiotics or steroids were prescribed.  At follow-up she presented with Dr. Lucio Edward 10/04/2020 and GI.  At this time she was already on Dexilant and Pepcid.  She underwent a swallow study on 10/15/2020 and it is unchanged compared to baseline.  He is able to swallow well without any aspiration using compensatory mechanisms.  She then presented via the ED on 10/23/2020 because of continued worsening of cough.  She also reports chronic bronchorrhea.  She coughs at least 10 times sputum and each time quite a  bit.  It is clear in color.  Has never been measured in the sputum cup.  She complained of ongoing cough and ongoing acid reflux.  [GI endoscopy is pending at this time] she had a chest x-ray in the ED the representative slice it is worsening compared to 1 month ago.  No CT scan available.  No leukocytosis no fever no hypoxemia.  Cough is worse when she lies flat.  EDP started her on a short course steroid orally.  She was then admitted by the hospitalist who is now consulted [place].  She is frustrated by her continued symptoms.  Noted that she has baseline anemia.  Her hemoglobin in May 2022 was 11.9 g%.  But July dropped to 10.2 g%.  And currently 7.3 g%.  She has worsening anemia. Results for KIARIA, QUINNELL (MRN [id number]) as of 10/24/2020 15:13  Ref. Range 09/27/2020 10:49 10/23/2020 13:28  Eosinophils Absolute Latest Ref Range: 0.0 - 0.5 K/uL 0.1 0.1   Pertinent  Medical History    has a past medical history of Arthritis, Cervical cancer ([place]) (2005), COPD (chronic obstructive pulmonary disease) ([place]), Multiple sclerosis ([place]), Osteoporosis, Other diseases of vocal cords, Other voice and resonance disorders, Pneumonia (2014, 1992), Sleep apnea, and Unspecified hypothyroidism.   reports that she has never smoked. She has never used smokeless tobacco.  Past Surgical History:  Procedure Laterality Date   ABDOMINAL HYSTERECTOMY  03/25/2003   for cervical cancer, complete  CATARACT EXTRACTION, BILATERAL Bilateral    lens for cataracts   CONVERSION TO TOTAL HIP Right 09/18/2015   Procedure: CONVERSION OF RIGHT HEMI ARTHROPLASTY TO TOTAL HIP ARTHROPLASTY ACETABULAR REVISON ;  Surgeon: Paralee Cancel, MD;  Location: [facility];  Service: Orthopedics;  Laterality: Right;   HIP ARTHROPLASTY Right 03/01/2013   Procedure: ARTHROPLASTY BIPOLAR HIP;  Surgeon: Mauri Pole, MD;  Location: [facility];  Service: Orthopedics;  Laterality: Right;   HIP CLOSED REDUCTION Right 03/12/2013   Procedure: CLOSED REDUCTION  HIP;  Surgeon: Gearlean Alf, MD;  Location: [facility];  Service: Orthopedics;  Laterality: Right;   HIP CLOSED REDUCTION Right 03/19/2013   Procedure: CLOSED REDUCTION HIP;  Surgeon: Johnn Hai, MD;  Location: [facility];  Service: Orthopedics;  Laterality: Right;   HIP CLOSED REDUCTION Right 11/10/2015   Procedure: CLOSED MANIPULATION HIP;  Surgeon: Rod Can, MD;  Location: [facility];  Service: Orthopedics;  Laterality: Right;   HIP CLOSED REDUCTION Right 06/05/2017   Procedure: CLOSED REDUCTION HIP;  Surgeon: [name], MD;  Location: [facility];  Service: Orthopedics;  Laterality: Right;   TOTAL HIP REVISION Right 06/06/2017   Procedure: TOTAL HIP REVISION;  Surgeon: [name], MD;  Location: [facility];  Service: Orthopedics;  Laterality: Right;   VESICOVAGINAL FISTULA CLOSURE W/ TAH  03/25/2003    Allergies  Allergen Reactions   Tramadol     hallucinations    Contrast Media [Iodinated Diagnostic Agents] Rash   Iohexol Hives   Methylprednisolone Other (See Comments)    Reaction:  Decreases pts heart rate    Morphine And Related Other (See Comments)    Reaction:  Hallucinations    Oxycodone-Acetaminophen Rash and Other (See Comments)    Reaction:  Hallucinations    Phenytoin Sodium Extended Rash   Zanaflex [Tizanidine Hydrochloride] Rash    Immunization History  Administered Date(s) Administered   DTP 04/10/2009   Fluad Quad(high Dose 65+) 12/29/2018   Influenza Split 12/10/2010, 02/11/2012, 12/22/2012, 01/02/2014, 12/22/2014   Influenza Whole 02/19/2005, 03/28/2009, 01/23/2016   Influenza, High Dose Seasonal PF 02/23/2018   Influenza,inj,Quad PF,6+ Mos 02/10/2017   Influenza-Unspecified 12/02/2018, 12/31/2018, 01/08/2019, 12/22/2019   Moderna Sars-Covid-2 Vaccination 04/29/2019, 06/01/2019   Pneumococcal Conjugate-13 11/06/2015   Pneumococcal Polysaccharide-23 03/28/2009, 04/26/2019   Tdap 04/26/2019   Zoster Recombinat (Shingrix) 09/07/2019, 02/17/2020    Family  History  Problem Relation Age of Onset   Other Mother        COVID   Colon cancer Father    Atrial fibrillation Brother    Heart attack Maternal Grandfather    Parkinson's disease Paternal Grandfather      Current Facility-Administered Medications:    acetaminophen (TYLENOL) tablet 500 mg, 500 mg, Oral, Q6H PRN, Wynetta Fines T, MD   acidophilus (RISAQUAD) capsule 1 capsule, 1 capsule, Oral, Daily, Wynetta Fines T, MD, 1 capsule at 10/24/20 0954   albuterol (PROVENTIL) (2.5 MG/3ML) 0.083% nebulizer solution 2.5 mg, 2.5 mg, Inhalation, Q6H PRN, Wynetta Fines T, MD   Ampicillin-Sulbactam (UNASYN) 3 g in sodium chloride 0.9 % 100 mL IVPB, 3 g, Intravenous, Q6H, Arlyn Dunning M, RPH, Last Rate: 200 mL/hr at 10/24/20 1007, 3 g at 10/24/20 1007   baclofen (LIORESAL) tablet 20 mg, 20 mg, Oral, TID PRN, Wynetta Fines T, MD   benzonatate (TESSALON) capsule 200 mg, 200 mg, Oral, TID, Wynetta Fines T, MD, 200 mg at 10/24/20 5638   dalfampridine TB12 10 mg, 10 mg, Oral, BID, [name], MD  enoxaparin (LOVENOX) injection 40 mg, 40 mg, Subcutaneous, Daily, [place], Ping T, MD, 40 mg at 10/24/20 0955   famotidine (PEPCID) tablet 20 mg, 20 mg, Oral, QHS, Wynetta Fines T, MD, 20 mg at 10/23/20 2155   feeding supplement (ENSURE ENLIVE / ENSURE PLUS) liquid 237 mL, 237 mL, Oral, BID BM, Wynetta Fines T, MD, 237 mL at 10/24/20 0955   FLUoxetine (PROZAC) capsule 40 mg, 40 mg, Oral, Daily, Wynetta Fines T, MD, 40 mg at 10/24/20 6283   furosemide (LASIX) tablet 20 mg, 20 mg, Oral, Daily, Wynetta Fines T, MD, 20 mg at 10/24/20 0954   gabapentin (NEURONTIN) capsule 600 mg, 600 mg, Oral, Daily, [place], Ping T, MD, 600 mg at 10/24/20 0954   guaiFENesin (MUCINEX) 12 hr tablet 1,200 mg, 1,200 mg, Oral, BID, Wynetta Fines T, MD, 1,200 mg at 10/24/20 0954   ipratropium-albuterol (DUONEB) 0.5-2.5 (3) MG/3ML nebulizer solution 3 mL, 3 mL, Nebulization, TID, Barb Merino, MD   levothyroxine (SYNTHROID) tablet 100 mcg, 100 mcg, Oral,  Q0600, Wynetta Fines T, MD, 100 mcg at 10/24/20 0536   mirabegron ER (MYRBETRIQ) tablet 25 mg, 25 mg, Oral, Daily, Wynetta Fines T, MD, 25 mg at 10/24/20 0954   mometasone-formoterol (DULERA) 100-5 MCG/ACT inhaler 2 puff, 2 puff, Inhalation, BID, Wynetta Fines T, MD, 2 puff at 10/24/20 0810   pantoprazole (PROTONIX) EC tablet 40 mg, 40 mg, Oral, BID AC, Wynetta Fines T, MD, 40 mg at 10/24/20 1517   pilocarpine (SALAGEN) tablet 7.5 mg, 7.5 mg, Oral, BID, Wynetta Fines T, MD, 7.5 mg at 10/24/20 6160   predniSONE (DELTASONE) tablet 20 mg, 20 mg, Oral, Q breakfast, Wynetta Fines T, MD, 20 mg at 10/24/20 7371   tamsulosin (FLOMAX) capsule 0.4 mg, 0.4 mg, Oral, Daily, [place], Ping T, MD, 0.4 mg at 10/24/20 0954   temazepam (RESTORIL) capsule 15 mg, 15 mg, Oral, QHS, Wynetta Fines T, MD, 15 mg at 10/23/20 2155   Significant Hospital Events: Including procedures, antibiotic start and stop dates in addition to other pertinent events   x  Interim History / Subjective:  10/23/2020 - admit.   Objective   Blood pressure (!) 111/54, pulse 73, temperature 98.9 F (37.2 C), temperature source Oral, resp. rate (!) 21, height 5' 2.75" (1.594 m), weight 53.7 kg, SpO2 97 %.        Intake/Output Summary (Last 24 hours) at 10/24/2020 1412 Last data filed at 10/24/2020 0600 Gross per 24 hour  Intake 683.18 ml  Output --  Net 683.18 ml   Filed Weights   10/23/20 1230  Weight: 53.7 kg    Examination: General: Thin lady lying in bed HENT: Intermittent cough but stridor is deep inspiration Lungs: Overall clear Cardiovascular: Normal heart sounds regular rate and rhythm Abdomen: Soft nontender no organomegaly Extremities: No cyanosis.  No clubbing no edema Neuro: Alert and oriented x3 GU: Not examined   LABS    PULMONARY No results for input(s): PHART, PCO2ART, PO2ART, HCO3, TCO2, O2SAT in the last 168 hours.  Invalid input(s): PCO2, PO2  CBC Recent Labs  Lab 10/23/20 1328 10/24/20 0504  HGB 9.7* 9.3*   HCT 31.0* 29.6*  WBC 11.8* 10.3  PLT 495* 462*    COAGULATION No results for input(s): INR in the last 168 hours.  CARDIAC  No results for input(s): TROPONINI in the last 168 hours. No results for input(s): PROBNP in the last 168 hours.   CHEMISTRY Recent Labs  Lab 10/23/20 1328  NA 133*  K 3.5  CL 97*  CO2 25  GLUCOSE 87  BUN 10  CREATININE 0.58  CALCIUM 9.0   Estimated Creatinine Clearance: 55 mL/min (by C-G formula based on SCr of 0.58 mg/dL).   LIVER Recent Labs  Lab 10/23/20 1328  AST 20  ALT 15  ALKPHOS 84  BILITOT 0.4  PROT 6.8  ALBUMIN 3.5     INFECTIOUS Recent Labs  Lab 10/23/20 1409  LATICACIDVEN 0.8     ENDOCRINE CBG (last 3)  No results for input(s): GLUCAP in the last 72 hours.       IMAGING x48h  - image(s) personally visualized  -   highlighted in bold DG Chest Portable 1 View  Result Date: 10/23/2020 CLINICAL DATA:  Chest pain with cough and shortness of breath. EXAM: PORTABLE CHEST 1 VIEW COMPARISON:  Radiographs 09/27/2020 and 07/18/2020.  CT 09/27/2020. FINDINGS: 1234 hours. The heart size and mediastinal contours are stable. There are progressive airspace opacities at the left lung base and left perihilar region. Minimal patchy opacity at the right lung base appears improved. There is no significant pleural effusion or pneumothorax. Old fracture the proximal right humerus appears unchanged. IMPRESSION: Progressive multifocal left lung airspace opacities consistent with progressive multilobar pneumonia. Right basilar involvement appears mildly improved in the interval. Electronically Signed   By: Richardean Sale M.D.   On: 10/23/2020 13:15     Resolved Hospital Problem list   x  Assessment & Plan:  Baseline: -Multiple sclerosis and bilateral vocal cord paralysis but preserved swallowing. -Has inspiratory stridorous noise  Currebnt - Chronic cough - Bronchorrhea - Worsening bilateral pulmonary infiltrates particularly on  the left side. - Female/non-smoker  - DDx - > initial suspicion a month ago was aspiration.  Unclear if aspiration is playing any role if at all.  This is because she is at high risk for aspiration because of vocal cord paralysis but swallow eval is recently normal.  Could be vasculitis.  Could be bronchoalveolar cell carcinoma.   Plan -Get high-resolution CT chest without contrast - Do not think there is a point in repeating swallow eval - Get negative inspiratory force and forced vital capacity - Check for urine strep and urine Legionella and QuantiFERON gold - Check blood ESR and vasculitis panel and procalcitonin - Based on results consider bronchoscopy with lavage with transbronchial biopsy [Dr. Valeta Harms if this pathway] versus surgical lung biopsy [Dr. Roxan Hockey or Dr. Tera Mater if this pathway]  Best Practice (right click and "Reselect all SmartList Selections" daily)   Pulmonary will follow SIGNATURE    Dr. Brand Males, M.D., F.C.C.P,  Pulmonary and Critical Care Medicine Staff Physician, [place] Director - Interstitial Lung Disease  Program  Pulmonary [facility] [place], [place], [address]  NPI Number:  NPI #[id number]  Pager: [phone], If no answer  -> Check AMION or Try [phone] Telephone (clinical office): [phone] Telephone (research): [phone]  2:12 PM 10/24/2020

## 2020-10-24 NOTE — Progress Notes (Signed)
IVT: Pt alert;awake;explained need for IV access;stated needs to use bathroom at this time.Will come back after patient is finished.

## 2020-10-25 ENCOUNTER — Telehealth: Payer: Self-pay | Admitting: Internal Medicine

## 2020-10-25 DIAGNOSIS — J189 Pneumonia, unspecified organism: Secondary | ICD-10-CM | POA: Diagnosis not present

## 2020-10-25 DIAGNOSIS — R053 Chronic cough: Secondary | ICD-10-CM | POA: Diagnosis not present

## 2020-10-25 DIAGNOSIS — J9809 Other diseases of bronchus, not elsewhere classified: Secondary | ICD-10-CM | POA: Diagnosis not present

## 2020-10-25 LAB — PROTIME-INR
INR: 1 (ref 0.8–1.2)
Prothrombin Time: 13.4 seconds (ref 11.4–15.2)

## 2020-10-25 LAB — ANCA TITERS
Atypical P-ANCA titer: <1:20 {titer}
C-ANCA: <1:20 {titer}
P-ANCA: <1:20 {titer}

## 2020-10-25 LAB — GLOMERULAR BASEMENT MEMBRANE ANTIBODIES: GBM Ab: <0.2 units (ref 0.0–0.9)

## 2020-10-25 MED ORDER — BENZONATATE 200 MG PO CAPS: 200.0000 mg | ORAL_CAPSULE | Freq: Three times a day (TID) | ORAL | 0 refills | Status: DC

## 2020-10-25 NOTE — Discharge Summary (Signed)
Physician Discharge Summary  Kayla [name] [id number] DOB: April 16, 1950 DOA: 10/23/2020  PCP: Janith Lima, MD  Admit date: 10/23/2020 Discharge date: 10/25/2020  Admitted From: Home Disposition: Home  Recommendations for Outpatient Follow-up:  Follow-up for bronchoscopy tomorrow. Home Health: Not applicable Equipment/Devices: Not applicable.  She has wheeled walker at home.  Discharge Condition: Stable CODE STATUS: Full code Diet recommendation: Regular diet.  Aspiration precautions.  Discharge summary: 70 year old female with chronic cough, vocal cord paralysis, recently worsening symptoms with multiple hospitalization presented back to the emergency room with ongoing intractable cough and shortness of breath.  Admitted last month to the hospital and treated with steroids.  Suspected chronic aspiration, recent modified barium swallow was negative.  A chest x-ray with evidence of multifocal airspace disease, initially suspected aspiration pneumonitis.  Admit to the hospital due to significant symptoms. CT scan of the chest 8/3 consistent with groundglass attenuation and consolidation in the bilateral lungs, suspect atypical infection.  Procalcitonin negative.  Recent autoimmune work-up negative.  Patient on antibiotics and a steroid that was discontinued.  Seen by pulmonary.  Recommended bronchoscopy and biopsy planned for tomorrow 8/5.  Discharged home on symptomatic treatment, Mucinex and bronchodilators, Tessalon Perles, aspiration precautions. Continue all long-term medications.  Further management as per pulmonary. 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    Discharge Diagnoses:  Active Problems:   Multifocal pneumonia   Aspiration into airway    Discharge Instructions  Discharge Instructions     Call MD for:  difficulty breathing, headache or visual disturbances   Complete by: As directed    Call MD for:  severe uncontrolled pain   Complete by: As directed    Diet general    Complete by: As directed    Increase activity slowly   Complete by: As directed       Allergies as of 10/25/2020       Reactions   Tramadol    hallucinations    Contrast Media [iodinated Diagnostic Agents] Rash   Iohexol Hives   Methylprednisolone Other (See Comments)   Reaction:  Decreases pts heart rate    Morphine And Related Other (See Comments)   Reaction:  Hallucinations    Oxycodone-acetaminophen Rash, Other (See Comments)   Reaction:  Hallucinations    Phenytoin Sodium Extended Rash   Zanaflex [tizanidine Hydrochloride] Rash        Medication List     TAKE these medications    acetaminophen 500 MG tablet Commonly known as: TYLENOL Take 500 mg by mouth every 6 (six) hours as needed for mild pain, fever or headache.   amphetamine-dextroamphetamine 20 MG tablet Commonly known as: ADDERALL Take 20 mg by mouth daily.   baclofen 20 MG tablet Commonly known as: LIORESAL Take 20 mg by mouth 3 (three) times daily as needed for muscle spasms.   benzonatate 200 MG capsule Commonly known as: TESSALON Take 1 capsule (200 mg total) by mouth 3 (three) times daily.   CALCIUM PO Take 1 tablet by mouth daily.   dalfampridine 10 MG Tb12 Take 10 mg by mouth 2 (two) times daily.   dexlansoprazole 60 MG capsule Commonly known as: Dexilant TAKE 1 CAPSULE BY MOUTH  ONCE DAILY WITH BREAKFAST What changed:  how much to take how to take this when to take this additional instructions   Dulera 100-5 MCG/ACT Aero Generic drug: mometasone-formoterol USE 2 PUFFS EVERY 12 HOURS. What changed:  how much to take how to take this when to take this additional instructions  famotidine 20 MG tablet Commonly known as: Pepcid One after supper What changed:  how much to take how to take this when to take this additional instructions   FLUoxetine 40 MG capsule Commonly known as: PROZAC Take 40 mg by mouth daily.   Flutter Devi Use as directed   furosemide 20 MG  tablet Commonly known as: LASIX TAKE 1 TABLET BY MOUTH  DAILY   gabapentin 600 MG tablet Commonly known as: NEURONTIN Take 1 tablet (600 mg total) by mouth 3 (three) times daily. What changed: when to take this   levothyroxine 100 MCG tablet Commonly known as: SYNTHROID TAKE 1 TABLET BY MOUTH ONCE DAILY BEFORE BREAKFAST What changed:  how much to take how to take this when to take this additional instructions   multivitamin with minerals Tabs tablet Take 1 tablet by mouth daily.   Myrbetriq 25 MG Tb24 tablet Generic drug: mirabegron ER Take 25 mg by mouth daily.   ocrelizumab 300 MG/10ML injection Commonly known as: OCREVUS Inject 300 mg into the vein every 6 (six) months.   pilocarpine 7.5 MG tablet Commonly known as: SALAGEN Take 7.5 mg by mouth 2 (two) times daily.   PROBIOTIC PO Take 1 capsule by mouth daily. Harmony probiotic   tamsulosin 0.4 MG Caps capsule Commonly known as: FLOMAX Take 0.4 mg by mouth daily.   temazepam 15 MG capsule Commonly known as: RESTORIL Take 15 mg by mouth at bedtime.   Ventolin HFA 108 (90 Base) MCG/ACT inhaler Generic drug: albuterol INHALE 2 PUFFS INTO LUNGS EVERY 6 HOURS AS NEEDED. What changed: See the new instructions.        Allergies  Allergen Reactions   Tramadol     hallucinations    Contrast Media [Iodinated Diagnostic Agents] Rash   Iohexol Hives   Methylprednisolone Other (See Comments)    Reaction:  Decreases pts heart rate    Morphine And Related Other (See Comments)    Reaction:  Hallucinations    Oxycodone-Acetaminophen Rash and Other (See Comments)    Reaction:  Hallucinations    Phenytoin Sodium Extended Rash   Zanaflex [Tizanidine Hydrochloride] Rash    Consultations: Pulmonary   Procedures/Studies: DG Chest 2 View  Result Date: 09/27/2020 CLINICAL DATA:  Cough for the past 4 months. EXAM: CHEST - 2 VIEW COMPARISON:  Chest x-ray dated July 18, 2020. FINDINGS: The heart size and mediastinal  contours are within normal limits. Normal pulmonary vascularity. New patchy somewhat nodular airspace disease in the left lower lobe. No pleural effusion or pneumothorax. Unchanged eventration of the right hemidiaphragm. No acute osseous abnormality. Old healed fracture of the right proximal humerus. IMPRESSION: 1. Patchy left lower lobe pneumonia. Electronically Signed   By: Titus Dubin M.D.   On: 09/27/2020 11:09   CT Chest Wo Contrast  Result Date: 09/27/2020 CLINICAL DATA:  Cough for the past 4 months. EXAM: CT CHEST WITHOUT CONTRAST TECHNIQUE: Multidetector CT imaging of the chest was performed following the standard protocol without IV contrast. COMPARISON:  Chest x-ray from same day. CT chest dated April 10, 2019. FINDINGS: Cardiovascular: No significant vascular findings. Normal heart size. No pericardial effusion. No thoracic aortic aneurysm. Mild atherosclerotic calcification of the thoracic aorta. Mediastinum/Nodes: No enlarged mediastinal or axillary lymph nodes. Thyroid gland and esophagus demonstrate no significant findings. Lungs/Pleura: Small amount of layering secretions in the distal trachea. New peribronchovascular ground-glass densities with inter- and interlobular septal thickening in the right lower lobe (series 4, image 101). New more focal small nodular  consolidations in the medial right lower lobe (series 4, images 81 and 93). Milder peribronchovascular nodular densities in the left lower lobe. New 14 x 10 mm nodular density in the medial right middle lobe (series 4, image 79). No pleural effusion or pneumothorax. Upper Abdomen: No acute abnormality. Unchanged small cyst in the central right liver. Musculoskeletal: No acute or significant osseous findings. Old healed fracture of the proximal sternal body, new since January 2021. IMPRESSION: 1. New peribronchovascular ground-glass densities with septal thickening in the right lower lobe and milder peribronchovascular nodular  densities in the left lower lobe, concerning for multifocal atypical pneumonia, possibly aspiration related given small amount of layering secretions in the distal trachea. 2. New 14 x 10 mm nodular density in the medial right middle lobe is likely related to the above process,, but attention on follow-up imaging is recommended. 3. Aortic Atherosclerosis (ICD10-I70.0). Electronically Signed   By: Titus Dubin M.D.   On: 09/27/2020 12:19   DG SWALLOW FUNC OP MEDICARE SPEECH PATH  Result Date: 10/15/2020 Formatting of this result is different from the original. Objective Swallowing Evaluation: Type of Study: MBS-Modified Barium Swallow Study  Patient Details Name: Kayla [name] MRN: [id number] Date of Birth: [birth year] Today's Date: 10/15/2020 Time: SLP Start Time (ACUTE ONLY): 1140 -SLP Stop Time (ACUTE ONLY): 1210 SLP Time Calculation (min) (ACUTE ONLY): 30 min Past Medical History: Past Medical History: Diagnosis Date  Arthritis   oa  Cervical cancer ([place]) 2005  COPD (chronic obstructive pulmonary disease) ([place])   Multiple sclerosis ([place])   Osteoporosis   Other diseases of vocal cords   vocal cords are not even  Other voice and resonance disorders   Pneumonia 2014, 1992  Sleep apnea   no cpap used  Unspecified hypothyroidism  Past Surgical History: Past Surgical History: Procedure Laterality Date  ABDOMINAL HYSTERECTOMY  03/25/2003  for cervical cancer, complete  CATARACT EXTRACTION, BILATERAL Bilateral   lens for cataracts  CONVERSION TO TOTAL HIP Right 09/18/2015  Procedure: CONVERSION OF RIGHT HEMI ARTHROPLASTY TO TOTAL HIP ARTHROPLASTY ACETABULAR REVISON ;  Surgeon: Paralee Cancel, MD;  Location: [facility];  Service: Orthopedics;  Laterality: Right;  HIP ARTHROPLASTY Right 03/01/2013  Procedure: ARTHROPLASTY BIPOLAR HIP;  Surgeon: Mauri Pole, MD;  Location: [facility];  Service: Orthopedics;  Laterality: Right;  HIP CLOSED REDUCTION Right 03/12/2013  Procedure: CLOSED REDUCTION HIP;  Surgeon: Gearlean Alf,  MD;  Location: [facility];  Service: Orthopedics;  Laterality: Right;  HIP CLOSED REDUCTION Right 03/19/2013  Procedure: CLOSED REDUCTION HIP;  Surgeon: Johnn Hai, MD;  Location: [facility];  Service: Orthopedics;  Laterality: Right;  HIP CLOSED REDUCTION Right 11/10/2015  Procedure: CLOSED MANIPULATION HIP;  Surgeon: Rod Can, MD;  Location: [facility];  Service: Orthopedics;  Laterality: Right;  HIP CLOSED REDUCTION Right 06/05/2017  Procedure: CLOSED REDUCTION HIP;  Surgeon: [name], MD;  Location: [facility];  Service: Orthopedics;  Laterality: Right;  TOTAL HIP REVISION Right 06/06/2017  Procedure: TOTAL HIP REVISION;  Surgeon: [name], MD;  Location: [facility];  Service: Orthopedics;  Laterality: Right;  VESICOVAGINAL FISTULA CLOSURE W/ TAH  03/25/2003 HPI: 70 yo female with h/o COPD, MS, OA, hypothyroidism, GERD, bilateral vocal fold paralysis (since 2015) referred for OP MBS.  She had recent admission to [facility] (7/7-09/29/20) for pna. She has had ongoing cough since March. She is being followed by Dr. Melvyn Novas at [facility] Pulmonology and Dr. [name], ENT.  GI f/u on 7/14, with subsequent recommendations for MBS, EGD and  colonoscopy.  Pt underwent clinical swallow eval 09/28/20 during admission to [facility] - findings suggested no overt concerns for aspiration but recommended GI f/u for reflux and potential relationship to aspiration pna.  Pt had two prior MBS in December of 2014 after a fall requiring CIR.  At that time she demonstrated residuals in pharynx and decreased UES patency. During pre-assessment today, pt endorsed coughing during and after meals as well as throughout the day, trouble swallowing pills, sensation of food sticking in throat with every meal.  Subjective: alert and good historian Assessment / Plan / Recommendation CHL IP CLINICAL IMPRESSIONS 10/15/2020 Clinical Impression Pt's swallow function, upon reviewing documentation from 2014 MBS studies, does not appear to have changed dramatically.  She  continues to present with solid food residue that sits in the valleculae and pyriform sinuses - it is sensed and cleared with a secondary swallow.  There was intermittent incomplete closure of the epiglottis over the vestibule and some reduced patency of the UES - this may be due in part to the degenerative changes in the cervical vertebrae (documented in 9/21 MR spine) and the resultant potential narrowing of the pharyngeal space.  Pt achieved effective laryngeal closure regardless, and there was no aspiration despite large, sequential thin liquid boluses and mixed solid/liquid consistencies. There was a notable CP bar, which played a part in capturing some residue superior to its surface.  Pt should continue to eat regular solids and drink thin liquids; she is already using f/u sub-swallows to clear pharynx. Potentially pt could have issues with pharyngeal residue spilling into larynx/trachea, but she demonstrated spontaneous compensatory strategies that helped reduce the residue.  Study was reviewed in real time; pt will f/u with GI. SLP Visit Diagnosis Dysphagia, pharyngeal phase (R13.13) Attention and concentration deficit following -- Frontal lobe and executive function deficit following -- Impact on safety and function --   CHL IP TREATMENT RECOMMENDATION 10/15/2020 Treatment Recommendations No treatment recommended at this time   Prognosis 03/22/2013 Prognosis for Safe Diet Advancement Good Barriers to Reach Goals -- Barriers/Prognosis Comment -- CHL IP DIET RECOMMENDATION 10/15/2020 SLP Diet Recommendations Regular solids;Thin liquid Liquid Administration via Cup;Straw Medication Administration Other (Comment) Compensations -- Postural Changes --   CHL IP OTHER RECOMMENDATIONS 10/15/2020 Recommended Consults -- Oral Care Recommendations Oral care BID Other Recommendations --   CHL IP FOLLOW UP RECOMMENDATIONS 10/15/2020 Follow up Recommendations None   CHL IP FREQUENCY AND DURATION 03/22/2013 Speech Therapy  Frequency (ACUTE ONLY) min 5x/week Treatment Duration 2 weeks      CHL IP ORAL PHASE 10/15/2020 Oral Phase WFL Oral - Pudding Teaspoon -- Oral - Pudding Cup -- Oral - Honey Teaspoon -- Oral - Honey Cup -- Oral - Nectar Teaspoon -- Oral - Nectar Cup -- Oral - Nectar Straw -- Oral - Thin Teaspoon -- Oral - Thin Cup -- Oral - Thin Straw -- Oral - Puree -- Oral - Mech Soft -- Oral - Regular -- Oral - Multi-Consistency -- Oral - Pill -- Oral Phase - Comment --  CHL IP PHARYNGEAL PHASE 10/15/2020 Pharyngeal Phase Impaired Pharyngeal- Pudding Teaspoon -- Pharyngeal -- Pharyngeal- Pudding Cup -- Pharyngeal -- Pharyngeal- Honey Teaspoon -- Pharyngeal -- Pharyngeal- Honey Cup -- Pharyngeal -- Pharyngeal- Nectar Teaspoon -- Pharyngeal -- Pharyngeal- Nectar Cup -- Pharyngeal -- Pharyngeal- Nectar Straw -- Pharyngeal -- Pharyngeal- Thin Teaspoon -- Pharyngeal -- Pharyngeal- Thin Cup Reduced pharyngeal peristalsis;Reduced epiglottic inversion;Pharyngeal residue - valleculae;Pharyngeal residue - pyriform Pharyngeal -- Pharyngeal- Thin Straw -- Pharyngeal -- Pharyngeal- Puree Reduced  epiglottic inversion;Pharyngeal residue - valleculae;Pharyngeal residue - pyriform Pharyngeal -- Pharyngeal- Mechanical Soft -- Pharyngeal -- Pharyngeal- Regular Reduced epiglottic inversion;Pharyngeal residue - pyriform;Pharyngeal residue - valleculae Pharyngeal -- Pharyngeal- Multi-consistency -- Pharyngeal -- Pharyngeal- Pill -- Pharyngeal -- Pharyngeal Comment --  No flowsheet data found. Juan Quam Laurice 10/15/2020, 4:03 PM            CLINICAL DATA:  Dysphagia. Cough. Recent aspiration pneumonia, history of MS EXAM: MODIFIED BARIUM SWALLOW TECHNIQUE: Different consistencies of barium were administered orally to the patient by the Speech Pathologist. Imaging of the pharynx was performed in the lateral projection. The radiologist was present in the fluoroscopy room for this study, providing personal supervision. FLUOROSCOPY TIME:  Fluoroscopy  Time:  1 minutes 22 seconds Radiation Exposure Index (if provided by the fluoroscopic device): 13.86 COMPARISON:  None. FINDINGS: No aspiration with any attempted consistencies including thin barium, puree, and solid. Pharyngeal retention is present with solid consistency. Prominent cricopharyngeus. IMPRESSION: No aspiration. Please refer to the Speech Pathologists report for complete details and recommendations. Electronically Signed   By: Macy Mis M.D.   On: 10/15/2020 12:12   CT Chest High Resolution  Result Date: 10/25/2020 CLINICAL DATA:  70 year old female with history of chronic cough. History of aspiration. EXAM: CT CHEST WITHOUT CONTRAST TECHNIQUE: Multidetector CT imaging of the chest was performed following the standard protocol without intravenous contrast. High resolution imaging of the lungs, as well as inspiratory and expiratory imaging, was performed. COMPARISON:  Chest CT 09/27/2020. FINDINGS: Cardiovascular: Heart size is normal. There is no significant pericardial fluid, thickening or pericardial calcification. Aortic atherosclerosis. No definite coronary artery calcifications. Mediastinum/Nodes: No pathologically enlarged mediastinal or hilar lymph nodes. Please note that accurate exclusion of hilar adenopathy is limited on noncontrast CT scans. Esophagus is unremarkable in appearance. No axillary lymphadenopathy. Lungs/Pleura: When compared to the prior examination, several of the areas of patchy peribronchovascular ground-glass attenuation and nodular airspace consolidation seen on the prior study have resolved or regressed (particularly in the basal segments of the right lower lobe). However, other areas have dramatically progressed or are new. The most significant progression is in the left lower lobe where there is far more frank consolidation than seen on the prior study, along with increasing areas of ground-glass attenuation. Additionally, in the left upper lobe there are  completely new areas of ground-glass attenuation and peribronchovascular airspace consolidation. Scattered areas of cylindrical bronchiectasis are again noted. Findings are randomly distributed in the lungs bilaterally and are asymmetric involving the left lung greater than the right. Inspiratory and expiratory imaging demonstrates some mild air trapping indicative of mild small airways disease. No pleural effusions. Upper Abdomen: 1.1 cm low-attenuation lesion between segments 8 and 5, incompletely characterized on today's non-contrast CT examination, but statistically likely to represent a cyst. Musculoskeletal: There are no aggressive appearing lytic or blastic lesions noted in the visualized portions of the skeleton. Old healed fracture of the upper sternum. IMPRESSION: 1. Waxing and waning patterns of ground-glass attenuation and consolidation in the lungs bilaterally, as above, with an unusual appearance which is favored to represent an incompletely treated atypical infection. 2. Aortic atherosclerosis. Aortic Atherosclerosis (ICD10-I70.0). Electronically Signed   By: Vinnie Langton M.D.   On: 10/25/2020 05:35   DG Chest Portable 1 View  Result Date: 10/23/2020 CLINICAL DATA:  Chest pain with cough and shortness of breath. EXAM: PORTABLE CHEST 1 VIEW COMPARISON:  Radiographs 09/27/2020 and 07/18/2020.  CT 09/27/2020. FINDINGS: 1234 hours. The heart size and mediastinal contours are  stable. There are progressive airspace opacities at the left lung base and left perihilar region. Minimal patchy opacity at the right lung base appears improved. There is no significant pleural effusion or pneumothorax. Old fracture the proximal right humerus appears unchanged. IMPRESSION: Progressive multifocal left lung airspace opacities consistent with progressive multilobar pneumonia. Right basilar involvement appears mildly improved in the interval. Electronically Signed   By: Richardean Sale M.D.   On: 10/23/2020 13:15    (Echo, Carotid, EGD, Colonoscopy, ERCP)    Subjective: Patient seen and examined.  Husband at the bedside.  Still has cough, occasionally uncontrolled.  Tessalon Perles help.  Seen by pulmonary and scheduled for bronchoscopy tomorrow.  Patient is eager to go home to come back for procedure tomorrow.   Discharge Exam: Vitals:   10/25/20 0408 10/25/20 0817  BP: 121/60   Pulse: 77   Resp: (!) 21   Temp: (!) 97.5 F (36.4 C)   SpO2: 93% 99%   Vitals:   10/24/20 2001 10/24/20 2015 10/25/20 0408 10/25/20 0817  BP: (!) 113/46  121/60   Pulse: 78  77   Resp: 18  (!) 21   Temp: 98 F (36.7 C)  (!) 97.5 F (36.4 C)   TempSrc: Oral  Oral   SpO2: 96% 97% 93% 99%  Weight:      Height:        General: Pt is alert, awake, not in acute distress Audible wheezing from vocal cord. Cardiovascular: RRR, S1/S2 +, no rubs, no gallops Respiratory: Fine crackles mostly at the bases.  Upper airway audible wheezing. Abdominal: Soft, NT, ND, bowel sounds + Extremities: no edema, no cyanosis    The results of significant diagnostics from this hospitalization (including imaging, microbiology, ancillary and laboratory) are listed below for reference.     Microbiology: Recent Results (from the past 240 hour(s))  Resp Panel by RT-PCR (Flu A&B, Covid) Nasopharyngeal Swab     Status: None   Collection Time: 10/23/20  1:28 PM   Specimen: Nasopharyngeal Swab; Nasopharyngeal(NP) swabs in vial transport medium  Result Value Ref Range Status   SARS Coronavirus 2 by RT PCR NEGATIVE NEGATIVE Final    Comment: (NOTE) SARS-CoV-2 target nucleic acids are NOT DETECTED.  The SARS-CoV-2 RNA is generally detectable in upper respiratory specimens during the acute phase of infection. The lowest concentration of SARS-CoV-2 viral copies this assay can detect is 138 copies/mL. A negative result does not preclude SARS-Cov-2 infection and should not be used as the sole basis for treatment or other patient  management decisions. A negative result may occur with  improper specimen collection/handling, submission of specimen other than nasopharyngeal swab, presence of viral mutation(s) within the areas targeted by this assay, and inadequate number of viral copies(<138 copies/mL). A negative result must be combined with clinical observations, patient history, and epidemiological information. The expected result is Negative.  Fact Sheet for Patients:  EntrepreneurPulse.com.au  Fact Sheet for Healthcare Providers:  IncredibleEmployment.be  This test is no t yet approved or cleared by the [place] FDA and  has been authorized for detection and/or diagnosis of SARS-CoV-2 by FDA under an Emergency Use Authorization (EUA). This EUA will remain  in effect (meaning this test can be used) for the duration of the COVID-19 declaration under Section 564(b)(1) of the Act, 21 U.S.C.section 360bbb-3(b)(1), unless the authorization is terminated  or revoked sooner.       Influenza A by PCR NEGATIVE NEGATIVE Final   Influenza B by PCR NEGATIVE NEGATIVE  Final    Comment: (NOTE) The Xpert Xpress SARS-CoV-2/FLU/RSV plus assay is intended as an aid in the diagnosis of influenza from Nasopharyngeal swab specimens and should not be used as a sole basis for treatment. Nasal washings and aspirates are unacceptable for Xpert Xpress SARS-CoV-2/FLU/RSV testing.  Fact Sheet for Patients: EntrepreneurPulse.com.au  Fact Sheet for Healthcare Providers: IncredibleEmployment.be  This test is not yet approved or cleared by the [place] FDA and has been authorized for detection and/or diagnosis of SARS-CoV-2 by FDA under an Emergency Use Authorization (EUA). This EUA will remain in effect (meaning this test can be used) for the duration of the COVID-19 declaration under Section 564(b)(1) of the Act, 21 U.S.C. section 360bbb-3(b)(1),  unless the authorization is terminated or revoked.  Performed at [facility], [place] [address], [place], [place] [address]   Blood culture (routine x 2)     Status: None (Preliminary result)   Collection Time: 10/23/20  2:07 PM   Specimen: BLOOD  Result Value Ref Range Status   Specimen Description   Final    BLOOD LEFT ANTECUBITAL Performed at [facility] [address], [place], [place] [address]    Special Requests   Final    BOTTLES DRAWN AEROBIC AND ANAEROBIC Blood Culture adequate volume Performed at [facility] [address], [place], [place] [address]    Culture   Final    NO GROWTH 2 DAYS Performed at [facility] [address] [place], [place] [address]    Report Status PENDING  Incomplete  Blood culture (routine x 2)     Status: None (Preliminary result)   Collection Time: 10/23/20  2:07 PM   Specimen: BLOOD  Result Value Ref Range Status   Specimen Description   Final    BLOOD RIGHT ANTECUBITAL Performed at [facility] [address], [place], [place] [address]    Special Requests   Final    BOTTLES DRAWN AEROBIC AND ANAEROBIC Blood Culture adequate volume Performed at [facility] [address] [place], [place] [address]    Culture   Final    NO GROWTH 2 DAYS Performed at [facility] [address], [place],  [address]    Report Status PENDING  Incomplete     Labs: BNP (last 3 results) Recent Labs    10/23/20 1231  BNP [id number]*   Basic Metabolic Panel: Recent Labs  Lab 10/23/20 1328  NA 133*  K 3.5  CL 97*  CO2 25  GLUCOSE 87  BUN 10  CREATININE 0.58  CALCIUM 9.0   Liver Function Tests: Recent Labs  Lab 10/23/20 1328  AST 20  ALT 15  ALKPHOS 84  BILITOT 0.4  PROT 6.8  ALBUMIN 3.5   No results for input(s): LIPASE, AMYLASE in the last 168 hours. No results for input(s): AMMONIA in the last 168  hours. CBC: Recent Labs  Lab 10/23/20 1328 10/24/20 0504  WBC 11.8* 10.3  NEUTROABS 8.7*  --   HGB 9.7* 9.3*  HCT 31.0* 29.6*  MCV 86.4 86.8  PLT 495* 462*   Cardiac Enzymes: No results for input(s): CKTOTAL, CKMB, CKMBINDEX, TROPONINI in the last 168 hours. BNP: Invalid input(s): POCBNP CBG: No results for input(s): GLUCAP in the last 168 hours. D-Dimer No results for input(s): DDIMER in the last 72 hours. Hgb A1c No results for input(s): HGBA1C in the last 72 hours. Lipid Profile No results for input(s):  CHOL, HDL, LDLCALC, TRIG, CHOLHDL, LDLDIRECT in the last 72 hours. Thyroid function studies No results for input(s): TSH, T4TOTAL, T3FREE, THYROIDAB in the last 72 hours.  Invalid input(s): FREET3 Anemia work up No results for input(s): VITAMINB12, FOLATE, FERRITIN, TIBC, IRON, RETICCTPCT in the last 72 hours. Urinalysis    Component Value Date/Time   COLORURINE YELLOW 11/04/2018 [place] 11/04/2018 1647   LABSPEC 1.015 11/04/2018 1647   PHURINE 7.0 11/04/2018 1647   GLUCOSEU NEGATIVE 11/04/2018 1647   HGBUR NEGATIVE 11/04/2018 1647   BILIRUBINUR NEGATIVE 11/04/2018 1647   KETONESUR NEGATIVE 11/04/2018 1647   PROTEINUR NEGATIVE 11/07/2016 2359   UROBILINOGEN 0.2 11/04/2018 1647   NITRITE NEGATIVE 11/04/2018 1647   LEUKOCYTESUR NEGATIVE 11/04/2018 1647   Sepsis Labs Invalid input(s): PROCALCITONIN,  WBC,  LACTICIDVEN Microbiology Recent Results (from the past 240 hour(s))  Resp Panel by RT-PCR (Flu A&B, Covid) Nasopharyngeal Swab     Status: None   Collection Time: 10/23/20  1:28 PM   Specimen: Nasopharyngeal Swab; Nasopharyngeal(NP) swabs in vial transport medium  Result Value Ref Range Status   SARS Coronavirus 2 by RT PCR NEGATIVE NEGATIVE Final    Comment: (NOTE) SARS-CoV-2 target nucleic acids are NOT DETECTED.  The SARS-CoV-2 RNA is generally detectable in upper respiratory specimens during the acute phase of infection. The  lowest concentration of SARS-CoV-2 viral copies this assay can detect is 138 copies/mL. A negative result does not preclude SARS-Cov-2 infection and should not be used as the sole basis for treatment or other patient management decisions. A negative result may occur with  improper specimen collection/handling, submission of specimen other than nasopharyngeal swab, presence of viral mutation(s) within the areas targeted by this assay, and inadequate number of viral copies(<138 copies/mL). A negative result must be combined with clinical observations, patient history, and epidemiological information. The expected result is Negative.  Fact Sheet for Patients:  EntrepreneurPulse.com.au  Fact Sheet for Healthcare Providers:  IncredibleEmployment.be  This test is no t yet approved or cleared by the [place] FDA and  has been authorized for detection and/or diagnosis of SARS-CoV-2 by FDA under an Emergency Use Authorization (EUA). This EUA will remain  in effect (meaning this test can be used) for the duration of the COVID-19 declaration under Section 564(b)(1) of the Act, 21 U.S.C.section 360bbb-3(b)(1), unless the authorization is terminated  or revoked sooner.       Influenza A by PCR NEGATIVE NEGATIVE Final   Influenza B by PCR NEGATIVE NEGATIVE Final    Comment: (NOTE) The Xpert Xpress SARS-CoV-2/FLU/RSV plus assay is intended as an aid in the diagnosis of influenza from Nasopharyngeal swab specimens and should not be used as a sole basis for treatment. Nasal washings and aspirates are unacceptable for Xpert Xpress SARS-CoV-2/FLU/RSV testing.  Fact Sheet for Patients: EntrepreneurPulse.com.au  Fact Sheet for Healthcare Providers: IncredibleEmployment.be  This test is not yet approved or cleared by the [place] FDA and has been authorized for detection and/or diagnosis of SARS-CoV-2 by FDA under  an Emergency Use Authorization (EUA). This EUA will remain in effect (meaning this test can be used) for the duration of the COVID-19 declaration under Section 564(b)(1) of the Act, 21 U.S.C. section 360bbb-3(b)(1), unless the authorization is terminated or revoked.  Performed at [facility], [place] [address], [place], [place] [address]   Blood culture (routine x 2)     Status: None (Preliminary result)   Collection Time: 10/23/20  2:07 PM   Specimen: BLOOD  Result Value Ref Range Status   Specimen Description   Final    BLOOD LEFT ANTECUBITAL Performed at [facility] [address], [place], [place] [address]    Special Requests   Final    BOTTLES DRAWN AEROBIC AND ANAEROBIC Blood Culture adequate volume Performed at [facility] [address] [place], [place] [address]    Culture   Final    NO GROWTH 2 DAYS Performed at [facility] [address], [place], [place] [address]    Report Status PENDING  Incomplete  Blood culture (routine x 2)     Status: None (Preliminary result)   Collection Time: 10/23/20  2:07 PM   Specimen: BLOOD  Result Value Ref Range Status   Specimen Description   Final    BLOOD RIGHT ANTECUBITAL Performed at [facility] [address] [place], [place] [address]    Special Requests   Final    BOTTLES DRAWN AEROBIC AND ANAEROBIC Blood Culture adequate volume Performed at [facility] [address], [place], [place] [address]    Culture   Final    NO GROWTH 2 DAYS Performed at [facility] [address] [place], [place] [address]    Report Status PENDING  Incomplete     Time coordinating discharge:  29 minutes  SIGNED:   Barb Merino, MD  [facility] 10/25/2020, 12:52 PM

## 2020-10-25 NOTE — Telephone Encounter (Signed)
Called Endo spoke with [place]. She states patient is on for 8am with Dr. Valeta Harms.    Called spoke with patient. Let her know to be back at [facility] tomorrow morning at 6am procedure is at 8am not eating or drinking after midnight and must have someone take you and pick you up.   Patient has covid test when she came into the hospital on Tuesday no need to another one today-verified by Endo.   Nothing further needed at this time.  Will route back to [facility] and MR as FYI

## 2020-10-25 NOTE — Consult Note (Signed)
NAME:  Kayla [name], MRN:  [id number], DOB:  Sep 25, 1950, LOS: 1 ADMISSION DATE:  10/23/2020, CONSULTATION DATE:  10/24/20 REFERRING MD:  Dr Norva Pavlov, CHIEF COMPLAINT:  chroic cough, bronchorrhea,    bRIEF    70 year old female with chronic cough.  She is a longstanding patient of Dr. Christinia [name].  Since [id number] she has had multiple sclerosis.  She had a hip fracture in 2004 and after that she has used a walker.  In 2006 she had a EGD with 3 cm hiatal hernia.  She is always had a stridorous noise with deep inspiration.  She has a 15-year history of dysphagia.  2008 at esophagogram was normal.  In May 2022 she saw Dr. Melida [name] and was noticed to have bilateral vocal fold paralysis.  She was admitted 09/27/2020 with history of fever but also chronic cough for 4 months.  She had failed outpatient antibiotics and steroids.  CT chest showed some scattered groundglass opacities [personally visualized] particularly in the left lower lobe.  She had a blood gas 09/29/2020 without any hypercapnia.  She had a ESR of 44.  She had normal procalcitonin.  She had serology that was negative in July 2022.  During this time MRSA PCR, COVID and flu were negative.  Respiratory virus panel was also negative.  Blood cultures were no growth.  The urine Streptococcus and Legionella was negative in July 2022.  She was discharged on 09/29/2020 with outpatient follow-up with GI and pulmonary.  No antibiotics or steroids were prescribed.  At follow-up she presented with Dr. Lucio [name] 10/04/2020 and GI.  At this time she was already on Dexilant and Pepcid.  She underwent a swallow study on 10/15/2020 and it is unchanged compared to baseline.  He is able to swallow well without any aspiration using compensatory mechanisms.  She then presented via the ED on 10/23/2020 because of continued worsening of cough.  She also reports chronic bronchorrhea.  She coughs at least 10 times sputum and each time quite a bit.  It is clear in  color.  Has never been measured in the sputum cup.  She complained of ongoing cough and ongoing acid reflux.  [GI endoscopy is pending at this time] she had a chest x-ray in the ED the representative slice it is worsening compared to 1 month ago.  No CT scan available.  No leukocytosis no fever no hypoxemia.  Cough is worse when she lies flat.  EDP started her on a short course steroid orally.  She was then admitted by the hospitalist who is now consulted [place].  She is frustrated by her continued symptoms.  Noted that she has baseline anemia.  Her hemoglobin in May 2022 was 11.9 g%.  But July dropped to 10.2 g%.  And currently 7.3 g%.  She has worsening anemia. Results for Kayla [name], Kayla [name] (MRN [id number]) as of 10/24/2020 15:13  Ref. Range 09/27/2020 10:49 10/23/2020 13:28  Eosinophils Absolute Latest Ref Range: 0.0 - 0.5 K/uL 0.1 0.1    Significant Hospital Events: Including procedures, antibiotic start and stop dates in addition to other pertinent events   10/23/2020 -  8/3 CT chest - LLL consolidation and lingular infilktrare worse. Rt side infiltrates eter  Interim History / Subjective:    8/4 - still with cough. D/w Dr Cato Mulligan - has mild insp stridor at rest due to bialteral VC paralysis. CT chest yesreday as above. PCT  < 0.1 ESR 80. Currently on pred 59m per day  Objective  Blood pressure 121/60, pulse 77, temperature (!) 97.5 F (36.4 C), temperature source Oral, resp. rate (!) 21, height 5' 2.75" (1.594 m), weight 53.7 kg, SpO2 99 %.        Intake/Output Summary (Last 24 hours) at 10/25/2020 0831 Last data filed at 10/25/2020 0600 Gross per 24 hour  Intake 129.53 ml  Output --  Net 129.53 ml   Filed Weights   10/23/20 1230  Weight: 53.7 kg    Examination: General Appearance:  Looks chronic unwell Head:  Normocephalic, without obvious abnormality, atraumatic Eyes:  PERRL - yes, conjunctiva/corneas - muddy     Ears:  Normal external ear canals, both ears Nose:  G tube - no Throat:   ETT TUBE - no , OG tube - no Neck:  Supple,  No enlargement/tenderness/nodules Lungs: Clear to auscultation bilaterally, Heart:  S1 and S2 normal, no murmur, CVP - no.  Pressors - no Abdomen:  Soft, no masses, no organomegaly Genitalia / Rectal:  Not done Extremities:  Extremities- intact Skin:  ntact in exposed areas . Sacral area - not examined Neurologic:  Sedation - none -> RASS - +1 . Moves all 4s - yes. CAM-ICU - neg . Orientation - x3+      LABS    PULMONARY No results for input(s): PHART, PCO2ART, PO2ART, HCO3, TCO2, O2SAT in the last 168 hours.  Invalid input(s): PCO2, PO2  CBC Recent Labs  Lab 10/23/20 1328 10/24/20 0504  HGB 9.7* 9.3*  HCT 31.0* 29.6*  WBC 11.8* 10.3  PLT 495* 462*    COAGULATION No results for input(s): INR in the last 168 hours.  CARDIAC  No results for input(s): TROPONINI in the last 168 hours. No results for input(s): PROBNP in the last 168 hours.   CHEMISTRY Recent Labs  Lab 10/23/20 1328  NA 133*  K 3.5  CL 97*  CO2 25  GLUCOSE 87  BUN 10  CREATININE 0.58  CALCIUM 9.0   Estimated Creatinine Clearance: 55 mL/min (by C-G formula based on SCr of 0.58 mg/dL).   LIVER Recent Labs  Lab 10/23/20 1328  AST 20  ALT 15  ALKPHOS 84  BILITOT 0.4  PROT 6.8  ALBUMIN 3.5     INFECTIOUS Recent Labs  Lab 10/23/20 1409 10/24/20 1559  LATICACIDVEN 0.8  --   PROCALCITON  --  <0.10     ENDOCRINE CBG (last 3)  No results for input(s): GLUCAP in the last 72 hours.       IMAGING x48h  - image(s) personally visualized  -   highlighted in bold CT Chest High Resolution  Result Date: 10/25/2020 CLINICAL DATA:  70 year old female with history of chronic cough. History of aspiration. EXAM: CT CHEST WITHOUT CONTRAST TECHNIQUE: Multidetector CT imaging of the chest was performed following the standard protocol without intravenous contrast. High resolution imaging of the lungs, as well as inspiratory and expiratory imaging,  was performed. COMPARISON:  Chest CT 09/27/2020. FINDINGS: Cardiovascular: Heart size is normal. There is no significant pericardial fluid, thickening or pericardial calcification. Aortic atherosclerosis. No definite coronary artery calcifications. Mediastinum/Nodes: No pathologically enlarged mediastinal or hilar lymph nodes. Please note that accurate exclusion of hilar adenopathy is limited on noncontrast CT scans. Esophagus is unremarkable in appearance. No axillary lymphadenopathy. Lungs/Pleura: When compared to the prior examination, several of the areas of patchy peribronchovascular ground-glass attenuation and nodular airspace consolidation seen on the prior study have resolved or regressed (particularly in the basal segments of the right lower lobe). However, other  areas have dramatically progressed or are new. The most significant progression is in the left lower lobe where there is far more frank consolidation than seen on the prior study, along with increasing areas of ground-glass attenuation. Additionally, in the left upper lobe there are completely new areas of ground-glass attenuation and peribronchovascular airspace consolidation. Scattered areas of cylindrical bronchiectasis are again noted. Findings are randomly distributed in the lungs bilaterally and are asymmetric involving the left lung greater than the right. Inspiratory and expiratory imaging demonstrates some mild air trapping indicative of mild small airways disease. No pleural effusions. Upper Abdomen: 1.1 cm low-attenuation lesion between segments 8 and 5, incompletely characterized on today's non-contrast CT examination, but statistically likely to represent a cyst. Musculoskeletal: There are no aggressive appearing lytic or blastic lesions noted in the visualized portions of the skeleton. Old healed fracture of the upper sternum. IMPRESSION: 1. Waxing and waning patterns of ground-glass attenuation and consolidation in the lungs  bilaterally, as above, with an unusual appearance which is favored to represent an incompletely treated atypical infection. 2. Aortic atherosclerosis. Aortic Atherosclerosis (ICD10-I70.0). Electronically Signed   By: Vinnie Langton M.D.   On: 10/25/2020 05:35   DG Chest Portable 1 View  Result Date: 10/23/2020 CLINICAL DATA:  Chest pain with cough and shortness of breath. EXAM: PORTABLE CHEST 1 VIEW COMPARISON:  Radiographs 09/27/2020 and 07/18/2020.  CT 09/27/2020. FINDINGS: 1234 hours. The heart size and mediastinal contours are stable. There are progressive airspace opacities at the left lung base and left perihilar region. Minimal patchy opacity at the right lung base appears improved. There is no significant pleural effusion or pneumothorax. Old fracture the proximal right humerus appears unchanged. IMPRESSION: Progressive multifocal left lung airspace opacities consistent with progressive multilobar pneumonia. Right basilar involvement appears mildly improved in the interval. Electronically Signed   By: Richardean Sale M.D.   On: 10/23/2020 13:15     Resolved Hospital Problem list   x  Assessment & Plan:  Baseline: -Multiple sclerosis and bilateral vocal cord paralysis but preserved swallowing. -Has inspiratory stridorous noise -Uses Walker - AT risk aspiration but preserved swallow eval July 2022 with GI  Current - Chronic cough - Bronchorrhea - Worsening bilateral pulmonary infiltrates particularly on the left side. - CT with LLL consolidation, ESR 80 - Female/non-smoker  - DDx - >High PROB for BOOP  Plan (d/w Dr Carney Corners) - cancel NPO for 10/25/2020 - Schedule bronch with BAL at left Lowe lobe +/- TBBx -  (d/w DR BAthes with ENT - given vc paralsysi probably better under GA)  - opd 10/26/20 by Dr Valeta Harms at [facility]  - probably can go home 10/25/2020 if [facility] ok with it   Best Practice (right click and "Reselect all SmartList Selections" daily)   Pulmonary followup as below  -> then switch to DR Chase Caller per her request  Future Appointments  Date Time Provider [place]  10/30/2020  9:30 AM Parrett, Fonnie Mu, NP [facility] None  11/15/2020  9:30 AM Ladene Artist, MD [facility]  12/19/2020  1:20 PM Janith Lima, MD [facility] None    > 40 min on care coordination and counseling  SIGNATURE    Dr. Brand Males, M.D., F.C.C.P,  Pulmonary and Critical Care Medicine Staff Physician, [place] Director - Interstitial Lung Disease  Program  [facility] [place], [place], [address]  NPI Number:  NPI #[id number]  Pager: [phone], If no answer  ->  Check AMION or Try [place] Telephone (clinical office): [phone] Telephone (research): [phone]  8:31 AM 10/25/2020

## 2020-10-25 NOTE — Anesthesia Preprocedure Evaluation (Addendum)
Anesthesia Evaluation  Patient identified by MRN, date of birth, ID band Patient awake    Reviewed: Allergy & Precautions, NPO status , Patient's Chart, lab work & pertinent test results  Airway Mallampati: II  TM Distance: >3 FB Neck ROM: Full    Dental no notable dental hx. (+) Teeth Intact, Dental Advisory Given   Pulmonary COPD,  LLL consolidation   Pulmonary exam normal breath sounds clear to auscultation       Cardiovascular + DOE  Normal cardiovascular exam Rhythm:Regular Rate:Normal     Neuro/Psych MS  r side weaker uses a walker    GI/Hepatic   Endo/Other  Hypothyroidism   Renal/GU      Musculoskeletal   Abdominal   Peds  Hematology  (+) anemia , Lab Results      Component                Value               Date                      WBC                      10.3                10/24/2020                HGB                      9.3 (L)             10/24/2020                HCT                      29.6 (L)            10/24/2020                MCV                      86.8                10/24/2020                PLT                      462 (H)             10/24/2020              Anesthesia Other Findings All: Tramadol, contrast, phenytoin  Reproductive/Obstetrics                            Anesthesia Physical Anesthesia Plan  ASA: 3  Anesthesia Plan: General   Post-op Pain Management:    Induction: Intravenous  PONV Risk Score and Plan: 4 or greater and Treatment may vary due to age or medical condition and Ondansetron  Airway Management Planned: Oral ETT  Additional Equipment: None  Intra-op Plan:   Post-operative Plan: Extubation in OR  Informed Consent: I have reviewed the patients History and Physical, chart, labs and discussed the procedure including the risks, benefits and alternatives for the proposed anesthesia with the patient or authorized  representative who has indicated his/her understanding and acceptance.     Dental advisory given  Plan Discussed with: CRNA and Anesthesiologist  Anesthesia Plan Comments:        Anesthesia Quick Evaluation

## 2020-10-25 NOTE — Progress Notes (Signed)
VC 750 ml NIF -14

## 2020-10-25 NOTE — Telephone Encounter (Signed)
PATIENT: Kayla [name] GENDER: female MRN: [id number] DOB: 04-Dec-1950 ADDRESS: [place] [address]    Please schedule the following:  Diagnosis: chronic cough, voocal cord paralysius, LLL Consolidation and LUL/Lingular infitlrated Procedure: Video bronchocoopy, flexible bronchoscopy with BAL +/- Transbronchial biopsy Envisia Classifer Transbronchial biopsy: NO asthesia: general Do you need Fluro? yes Size of Scope: regular Pre-med nebulized lidocaine: upto DR Icard Priority: schedule 10/26/20 at [facility] (patient currently inpatient at [facility] but will go home 10/25/2020 ) Date: for 10/26/20 by DR Valeta Harms (he is aware Alternate Date: first possible by Dr Valeta Harms   Location: wherever DR Icard can do Does patient have OSA? no DM? no Or Latex allergy? no Medication Restriction: no anticoaglto Anticoagulate/Antiplatelet: non Pre-op Labs Ordered: CBC, CMP, PT/INR, PTT - done as inpatient Imaging request: none       MISCELLANEOUS KEY INSTRUCTIONS     Please let Dr Chase Caller and Icard know via reply phone message on Epic  Thank you     Key patient medical info     Allergy History:  Allergies  Allergen Reactions   Tramadol     hallucinations    Contrast Media [Iodinated Diagnostic Agents] Rash   Iohexol Hives   Methylprednisolone Other (See Comments)    Reaction:  Decreases pts heart rate    Morphine And Related Other (See Comments)    Reaction:  Hallucinations    Oxycodone-Acetaminophen Rash and Other (See Comments)    Reaction:  Hallucinations    Phenytoin Sodium Extended Rash   Zanaflex [Tizanidine Hydrochloride] Rash    No current facility-administered medications for this visit. No current outpatient medications on file.  Facility-Administered Medications Ordered in Other Visits:    acetaminophen (TYLENOL) tablet 500 mg, 500 mg, Oral, Q6H PRN, Wynetta Fines T, MD   acidophilus (RISAQUAD) capsule 1 capsule, 1 capsule, Oral, Daily,  Wynetta Fines T, MD, 1 capsule at 10/24/20 0954   albuterol (PROVENTIL) (2.5 MG/3ML) 0.083% nebulizer solution 2.5 mg, 2.5 mg, Inhalation, Q6H PRN, Wynetta Fines T, MD   baclofen (LIORESAL) tablet 20 mg, 20 mg, Oral, TID PRN, Wynetta Fines T, MD   benzonatate (TESSALON) capsule 200 mg, 200 mg, Oral, TID, Wynetta Fines T, MD, 200 mg at 10/24/20 2132   dalfampridine TB12 10 mg, 10 mg, Oral, BID, Wynetta Fines T, MD   famotidine (PEPCID) tablet 20 mg, 20 mg, Oral, QHS, Zhang, Ping T, MD, 20 mg at 10/24/20 2132   feeding supplement (ENSURE ENLIVE / ENSURE PLUS) liquid 237 mL, 237 mL, Oral, BID BM, Wynetta Fines T, MD, 237 mL at 10/24/20 1547   FLUoxetine (PROZAC) capsule 40 mg, 40 mg, Oral, Daily, Wynetta Fines T, MD, 40 mg at 10/24/20 0953   furosemide (LASIX) tablet 20 mg, 20 mg, Oral, Daily, Wynetta Fines T, MD, 20 mg at 10/24/20 0954   gabapentin (NEURONTIN) capsule 600 mg, 600 mg, Oral, Daily, [place], Ping T, MD, 600 mg at 10/24/20 0954   guaiFENesin (ROBITUSSIN) 100 MG/5ML solution 200 mg, 10 mL, Oral, Q4H PRN, Barb Merino, MD, 200 mg at 10/24/20 1601   ipratropium-albuterol (DUONEB) 0.5-2.5 (3) MG/3ML nebulizer solution 3 mL, 3 mL, Nebulization, TID, Barb Merino, MD, 3 mL at 10/25/20 0814   levothyroxine (SYNTHROID) tablet 100 mcg, 100 mcg, Oral, Q0600, Wynetta Fines T, MD, 100 mcg at 10/25/20 0601   mirabegron ER (MYRBETRIQ) tablet 25 mg, 25 mg, Oral, Daily, Wynetta Fines T, MD, 25 mg at 10/24/20 0954   mometasone-formoterol (DULERA) 100-5 MCG/ACT inhaler 2  puff, 2 puff, Inhalation, BID, Wynetta Fines T, MD, 2 puff at 10/25/20 0816   pantoprazole (PROTONIX) EC tablet 40 mg, 40 mg, Oral, BID AC, Wynetta Fines T, MD, 40 mg at 10/24/20 1547   pilocarpine (SALAGEN) tablet 7.5 mg, 7.5 mg, Oral, BID, Wynetta Fines T, MD, 7.5 mg at 10/24/20 2133   tamsulosin (FLOMAX) capsule 0.4 mg, 0.4 mg, Oral, Daily, [place], Ping T, MD, 0.4 mg at 10/24/20 0954   temazepam (RESTORIL) capsule 15 mg, 15 mg, Oral, QHS, Wynetta Fines T, MD, 15  mg at 10/24/20 2132   has a past medical history of Arthritis, Cervical cancer ([place]) (2005), COPD (chronic obstructive pulmonary disease) ([place]), Multiple sclerosis ([place]), Osteoporosis, Other diseases of vocal cords, Other voice and resonance disorders, Pneumonia (2014, 1992), Sleep apnea, and Unspecified hypothyroidism.    has a past surgical history that includes Vesicovaginal fistula closure w/ TAH (03/25/2003); Hip Arthroplasty (Right, 03/01/2013); Hip Closed Reduction (Right, 03/12/2013); Hip Closed Reduction (Right, 03/19/2013); Abdominal hysterectomy (03/25/2003); Cataract extraction, bilateral (Bilateral); Conversion to total hip (Right, 09/18/2015); Hip Closed Reduction (Right, 11/10/2015); Hip Closed Reduction (Right, 06/05/2017); and Total hip revision (Right, 06/06/2017).   SIGNATURE    Dr. Brand Males, M.D., F.C.C.P,  Pulmonary and Critical Care Medicine Staff Physician, [place] Director - Interstitial Lung Disease  Program  Pulmonary [place] at [place], [place], [address]  Pager: [phone], If no answer or between  15:00h - 7:00h: call [phone] Telephone: [phone]  8:49 AM 10/25/2020

## 2020-10-25 NOTE — Progress Notes (Addendum)
Kayla [name] has orders to be discharged today. I was unable to reach patient by phone.  I left  A message on voice mail.  I instructed the patient to arrive at [place] entrance at  , register in the [place]. DO NOT eat or drink anything after midnight.  I instructed the patient to take the following medications in the am with just enough water to get them down: Dexilant, Use Dulera, Dalfampridine. Prozac, Gabapentin, Levothyroxine; if needed you may take Tylenol, Baclofen, Albuterol inhaler - bring it with you.  I asked patient to not wear any lotions, powders, cologne, jewelry, piercing, make-up or nail polish.  Wear clean clothes. Brush teeth. I informed patient that there will need to be a driver and someone to stay with him/her for the first 24 hours after surgery.   I instructed  patient to call [phone], in the am if there were any questions or problems.   I called to give patient arrival time, Kayla Carandang answered the phone. Patient said she will take Synthroidal, Flomax, Use Dulera inhaler. Patient reports that she does not have an Albuterol Inhaler.

## 2020-10-25 NOTE — H&P (View-Only) (Signed)
 NAME:  Kayla [name], MRN:  [id number], DOB:  01/21/1951, LOS: 1 ADMISSION DATE:  10/23/2020, CONSULTATION DATE:  10/24/20 REFERRING MD:  Dr Khuber Ghimire, CHIEF COMPLAINT:  chroic cough, bronchorrhea,    bRIEF    70-year-old female with chronic cough.  She is a longstanding patient of Dr. Michael [name].  Since 1994-9096 she has had multiple sclerosis.  She had a hip fracture in 2004 and after that she has used a walker.  In 2006 she had a EGD with 3 cm hiatal hernia.  She is always had a stridorous noise with deep inspiration.  She has a 15-year history of dysphagia.  2008 at esophagogram was normal.  In May 2022 she saw Dr. Dwight Bates and was noticed to have bilateral vocal fold paralysis.  She was admitted 09/27/2020 with history of fever but also chronic cough for 4 months.  She had failed outpatient antibiotics and steroids.  CT chest showed some scattered groundglass opacities [personally visualized] particularly in the left lower lobe.  She had a blood gas 09/29/2020 without any hypercapnia.  She had a ESR of 44.  She had normal procalcitonin.  She had serology that was negative in July 2022.  During this time MRSA PCR, COVID and flu were negative.  Respiratory virus panel was also negative.  Blood cultures were no growth.  The urine Streptococcus and Legionella was negative in July 2022.  She was discharged on 09/29/2020 with outpatient follow-up with GI and pulmonary.  No antibiotics or steroids were prescribed.  At follow-up she presented with Dr. Malcolm Stark 10/04/2020 and GI.  At this time she was already on Dexilant and Pepcid.  She underwent a swallow study on 10/15/2020 and it is unchanged compared to baseline.  He is able to swallow well without any aspiration using compensatory mechanisms.  She then presented via the ED on 10/23/2020 because of continued worsening of cough.  She also reports chronic bronchorrhea.  She coughs at least 10 times sputum and each time quite a bit.  It is clear in  color.  Has never been measured in the sputum cup.  She complained of ongoing cough and ongoing acid reflux.  [GI endoscopy is pending at this time] she had a chest x-ray in the ED the representative slice it is worsening compared to 1 month ago.  No CT scan available.  No leukocytosis no fever no hypoxemia.  Cough is worse when she lies flat.  EDP started her on a short course steroid orally.  She was then admitted by the hospitalist who is now consulted us.  She is frustrated by her continued symptoms.  Noted that she has baseline anemia.  Her hemoglobin in May 2022 was 11.9 g%.  But July dropped to 10.2 g%.  And currently 7.3 g%.  She has worsening anemia. Results for Amescua, Kirsti H (MRN [id number]) as of 10/24/2020 15:13  Ref. Range 09/27/2020 10:49 10/23/2020 13:28  Eosinophils Absolute Latest Ref Range: 0.0 - 0.5 K/uL 0.1 0.1    Significant Hospital Events: Including procedures, antibiotic start and stop dates in addition to other pertinent events   10/23/2020 -  8/3 CT chest - LLL consolidation and lingular infilktrare worse. Rt side infiltrates eter  Interim History / Subjective:    8/4 - still with cough. D/w Dr BAers - has mild insp stridor at rest due to bialteral VC paralysis. CT chest yesreday as above. PCT  < 0.1 ESR 80. Currently on pred 20mg per day  Objective     Blood pressure 121/60, pulse 77, temperature (!) 97.5 F (36.4 C), temperature source Oral, resp. rate (!) 21, height 5' 2.75" (1.594 m), weight 53.7 kg, SpO2 99 %.        Intake/Output Summary (Last 24 hours) at 10/25/2020 0831 Last data filed at 10/25/2020 0600 Gross per 24 hour  Intake 129.53 ml  Output --  Net 129.53 ml   Filed Weights   10/23/20 1230  Weight: 53.7 kg    Examination: General Appearance:  Looks chronic unwell Head:  Normocephalic, without obvious abnormality, atraumatic Eyes:  PERRL - yes, conjunctiva/corneas - muddy     Ears:  Normal external ear canals, both ears Nose:  G tube - no Throat:   ETT TUBE - no , OG tube - no Neck:  Supple,  No enlargement/tenderness/nodules Lungs: Clear to auscultation bilaterally, Heart:  S1 and S2 normal, no murmur, CVP - no.  Pressors - no Abdomen:  Soft, no masses, no organomegaly Genitalia / Rectal:  Not done Extremities:  Extremities- intact Skin:  ntact in exposed areas . Sacral area - not examined Neurologic:  Sedation - none -> RASS - +1 . Moves all 4s - yes. CAM-ICU - neg . Orientation - x3+      LABS    PULMONARY No results for input(s): PHART, PCO2ART, PO2ART, HCO3, TCO2, O2SAT in the last 168 hours.  Invalid input(s): PCO2, PO2  CBC Recent Labs  Lab 10/23/20 1328 10/24/20 0504  HGB 9.7* 9.3*  HCT 31.0* 29.6*  WBC 11.8* 10.3  PLT 495* 462*    COAGULATION No results for input(s): INR in the last 168 hours.  CARDIAC  No results for input(s): TROPONINI in the last 168 hours. No results for input(s): PROBNP in the last 168 hours.   CHEMISTRY Recent Labs  Lab 10/23/20 1328  NA 133*  K 3.5  CL 97*  CO2 25  GLUCOSE 87  BUN 10  CREATININE 0.58  CALCIUM 9.0   Estimated Creatinine Clearance: 55 mL/min (by C-G formula based on SCr of 0.58 mg/dL).   LIVER Recent Labs  Lab 10/23/20 1328  AST 20  ALT 15  ALKPHOS 84  BILITOT 0.4  PROT 6.8  ALBUMIN 3.5     INFECTIOUS Recent Labs  Lab 10/23/20 1409 10/24/20 1559  LATICACIDVEN 0.8  --   PROCALCITON  --  <0.10     ENDOCRINE CBG (last 3)  No results for input(s): GLUCAP in the last 72 hours.       IMAGING x48h  - image(s) personally visualized  -   highlighted in bold CT Chest High Resolution  Result Date: 10/25/2020 CLINICAL DATA:  70-year-old female with history of chronic cough. History of aspiration. EXAM: CT CHEST WITHOUT CONTRAST TECHNIQUE: Multidetector CT imaging of the chest was performed following the standard protocol without intravenous contrast. High resolution imaging of the lungs, as well as inspiratory and expiratory imaging,  was performed. COMPARISON:  Chest CT 09/27/2020. FINDINGS: Cardiovascular: Heart size is normal. There is no significant pericardial fluid, thickening or pericardial calcification. Aortic atherosclerosis. No definite coronary artery calcifications. Mediastinum/Nodes: No pathologically enlarged mediastinal or hilar lymph nodes. Please note that accurate exclusion of hilar adenopathy is limited on noncontrast CT scans. Esophagus is unremarkable in appearance. No axillary lymphadenopathy. Lungs/Pleura: When compared to the prior examination, several of the areas of patchy peribronchovascular ground-glass attenuation and nodular airspace consolidation seen on the prior study have resolved or regressed (particularly in the basal segments of the right lower lobe). However, other   areas have dramatically progressed or are new. The most significant progression is in the left lower lobe where there is far more frank consolidation than seen on the prior study, along with increasing areas of ground-glass attenuation. Additionally, in the left upper lobe there are completely new areas of ground-glass attenuation and peribronchovascular airspace consolidation. Scattered areas of cylindrical bronchiectasis are again noted. Findings are randomly distributed in the lungs bilaterally and are asymmetric involving the left lung greater than the right. Inspiratory and expiratory imaging demonstrates some mild air trapping indicative of mild small airways disease. No pleural effusions. Upper Abdomen: 1.1 cm low-attenuation lesion between segments 8 and 5, incompletely characterized on today's non-contrast CT examination, but statistically likely to represent a cyst. Musculoskeletal: There are no aggressive appearing lytic or blastic lesions noted in the visualized portions of the skeleton. Old healed fracture of the upper sternum. IMPRESSION: 1. Waxing and waning patterns of ground-glass attenuation and consolidation in the lungs  bilaterally, as above, with an unusual appearance which is favored to represent an incompletely treated atypical infection. 2. Aortic atherosclerosis. Aortic Atherosclerosis (ICD10-I70.0). Electronically Signed   By: Daniel  Entrikin M.D.   On: 10/25/2020 05:35   DG Chest Portable 1 View  Result Date: 10/23/2020 CLINICAL DATA:  Chest pain with cough and shortness of breath. EXAM: PORTABLE CHEST 1 VIEW COMPARISON:  Radiographs 09/27/2020 and 07/18/2020.  CT 09/27/2020. FINDINGS: 1234 hours. The heart size and mediastinal contours are stable. There are progressive airspace opacities at the left lung base and left perihilar region. Minimal patchy opacity at the right lung base appears improved. There is no significant pleural effusion or pneumothorax. Old fracture the proximal right humerus appears unchanged. IMPRESSION: Progressive multifocal left lung airspace opacities consistent with progressive multilobar pneumonia. Right basilar involvement appears mildly improved in the interval. Electronically Signed   By: William  Veazey M.D.   On: 10/23/2020 13:15     Resolved Hospital Problem list   x  Assessment & Plan:  Baseline: -Multiple sclerosis and bilateral vocal cord paralysis but preserved swallowing. -Has inspiratory stridorous noise -Uses Walker - AT risk aspiration but preserved swallow eval July 2022 with GI  Current - Chronic cough - Bronchorrhea - Worsening bilateral pulmonary infiltrates particularly on the left side. - CT with LLL consolidation, ESR 80 - Female/non-smoker  - DDx - >High PROB for BOOP  Plan (d/w Dr Brad Icard) - cancel NPO for 10/25/2020 - Schedule bronch with BAL at left Lowe lobe +/- TBBx -  (d/w DR BAthes with ENT - given vc paralsysi probably better under GA)  - opd 10/26/20 by Dr Icard at [place]  - probably can go home 10/25/2020 if [facility] ok with it   Best Practice (right click and "Reselect all SmartList Selections" daily)   Pulmonary followup as below  -> then switch to DR Soua Caltagirone per her request  Future Appointments  Date Time Provider Department Center  10/30/2020  9:30 AM Parrett, Tammy S, NP [facility] None  11/15/2020  9:30 AM Stark, Malcolm T, MD [facility]  12/19/2020  1:20 PM Jones, Thomas L, MD [facility] None    > 40 min on care coordination and counseling  SIGNATURE    Dr. Mylah Baynes, M.D., F.C.C.P,  Pulmonary and Critical Care Medicine Staff Physician, [facility] Director - Interstitial Lung Disease  Program  [facility] at [facility] [place], [place], [address]  NPI Number:  NPI #[id number]  Pager: [phone], If no answer  ->   Check AMION or Try [phone] Telephone (clinical office): [phone] Telephone (research): [phone]  8:31 AM 10/25/2020  

## 2020-10-26 ENCOUNTER — Ambulatory Visit (HOSPITAL_COMMUNITY): Payer: Medicare Other | Admitting: Anesthesiology

## 2020-10-26 ENCOUNTER — Encounter (HOSPITAL_COMMUNITY): Payer: Self-pay | Admitting: Pulmonary Disease

## 2020-10-26 ENCOUNTER — Ambulatory Visit (HOSPITAL_COMMUNITY): Payer: Medicare Other

## 2020-10-26 ENCOUNTER — Other Ambulatory Visit: Payer: Self-pay

## 2020-10-26 ENCOUNTER — Ambulatory Visit (HOSPITAL_COMMUNITY)
Admission: RE | Admit: 2020-10-26 | Discharge: 2020-10-26 | Disposition: A | Discharge status: Home / Self Care | Payer: Medicare Other | Attending: Pulmonary Disease | Admitting: Pulmonary Disease

## 2020-10-26 ENCOUNTER — Encounter (HOSPITAL_COMMUNITY)
Admission: RE | Disposition: A | Discharge status: Home / Self Care | Payer: Self-pay | Source: Home / Self Care | Attending: Pulmonary Disease

## 2020-10-26 DIAGNOSIS — Z79899 Other long term (current) drug therapy: Secondary | ICD-10-CM | POA: Diagnosis not present

## 2020-10-26 DIAGNOSIS — Z419 Encounter for procedure for purposes other than remedying health state, unspecified: Secondary | ICD-10-CM | POA: Diagnosis not present

## 2020-10-26 DIAGNOSIS — Z9989 Dependence on other enabling machines and devices: Secondary | ICD-10-CM | POA: Diagnosis not present

## 2020-10-26 DIAGNOSIS — G4733 Obstructive sleep apnea (adult) (pediatric): Secondary | ICD-10-CM | POA: Diagnosis not present

## 2020-10-26 DIAGNOSIS — R059 Cough, unspecified: Secondary | ICD-10-CM | POA: Insufficient documentation

## 2020-10-26 DIAGNOSIS — B954 Other streptococcus as the cause of diseases classified elsewhere: Secondary | ICD-10-CM | POA: Insufficient documentation

## 2020-10-26 DIAGNOSIS — J181 Lobar pneumonia, unspecified organism: Secondary | ICD-10-CM | POA: Diagnosis not present

## 2020-10-26 DIAGNOSIS — R918 Other nonspecific abnormal finding of lung field: Secondary | ICD-10-CM | POA: Diagnosis not present

## 2020-10-26 DIAGNOSIS — E871 Hypo-osmolality and hyponatremia: Secondary | ICD-10-CM | POA: Diagnosis not present

## 2020-10-26 DIAGNOSIS — G35 Multiple sclerosis: Secondary | ICD-10-CM | POA: Insufficient documentation

## 2020-10-26 HISTORY — PX: BRONCHIAL BIOPSY: SHX5109

## 2020-10-26 HISTORY — PX: VIDEO BRONCHOSCOPY: SHX5072

## 2020-10-26 HISTORY — PX: BRONCHIAL BRUSHINGS: SHX5108

## 2020-10-26 HISTORY — PX: BRONCHIAL WASHINGS: SHX5105

## 2020-10-26 LAB — BODY FLUID CELL COUNT WITH DIFFERENTIAL
Eos, Fluid: 0 %
Eos, Fluid: 0 %
Eos, Fluid: 0 %
Lymphs, Fluid: 4 %
Lymphs, Fluid: 4 %
Lymphs, Fluid: 5 %
Monocyte-Macrophage-Serous Fluid: 1 % — ABNORMAL LOW (ref 50–90)
Monocyte-Macrophage-Serous Fluid: 1 % — ABNORMAL LOW (ref 50–90)
Monocyte-Macrophage-Serous Fluid: 4 % — ABNORMAL LOW (ref 50–90)
Neutrophil Count, Fluid: 92 % — ABNORMAL HIGH (ref 0–25)
Neutrophil Count, Fluid: 94 % — ABNORMAL HIGH (ref 0–25)
Neutrophil Count, Fluid: 95 % — ABNORMAL HIGH (ref 0–25)
Total Nucleated Cell Count, Fluid: 2150 cu mm — ABNORMAL HIGH (ref 0–1000)
Total Nucleated Cell Count, Fluid: UNDETERMINED cu mm (ref 0–1000)
Total Nucleated Cell Count, Fluid: UNDETERMINED cu mm (ref 0–1000)

## 2020-10-26 SURGERY — BRONCHOSCOPY, WITH FLUOROSCOPY
Anesthesia: General

## 2020-10-26 MED ORDER — CHLORHEXIDINE GLUCONATE 0.12 % MT SOLN: 15.0000 mL | Freq: Once | OROMUCOSAL | Status: AC

## 2020-10-26 MED ORDER — ALBUTEROL SULFATE HFA 108 (90 BASE) MCG/ACT IN AERS
INHALATION_SPRAY | RESPIRATORY_TRACT | Status: DC | PRN
  Administered 2020-10-26: 6 via RESPIRATORY_TRACT

## 2020-10-26 MED ORDER — ROCURONIUM BROMIDE 10 MG/ML (PF) SYRINGE
PREFILLED_SYRINGE | INTRAVENOUS | Status: DC | PRN
  Administered 2020-10-26: 30 mg via INTRAVENOUS

## 2020-10-26 MED ORDER — LIDOCAINE 2% (20 MG/ML) 5 ML SYRINGE
INTRAMUSCULAR | Status: DC | PRN
  Administered 2020-10-26: 60 mg via INTRAVENOUS

## 2020-10-26 MED ORDER — CHLORHEXIDINE GLUCONATE 0.12 % MT SOLN
OROMUCOSAL | Status: AC
  Administered 2020-10-26: 15 mL via OROMUCOSAL
  Filled 2020-10-26: qty 15

## 2020-10-26 MED ORDER — FENTANYL CITRATE (PF) 250 MCG/5ML IJ SOLN
INTRAMUSCULAR | Status: DC | PRN
  Administered 2020-10-26: 25 ug via INTRAVENOUS

## 2020-10-26 MED ORDER — SUGAMMADEX SODIUM 200 MG/2ML IV SOLN
INTRAVENOUS | Status: DC | PRN
  Administered 2020-10-26: 200 mg via INTRAVENOUS

## 2020-10-26 MED ORDER — FENTANYL CITRATE (PF) 100 MCG/2ML IJ SOLN
INTRAMUSCULAR | Status: AC
  Filled 2020-10-26: qty 2

## 2020-10-26 MED ORDER — DEXAMETHASONE SODIUM PHOSPHATE 10 MG/ML IJ SOLN
INTRAMUSCULAR | Status: DC | PRN
  Administered 2020-10-26: 5 mg via INTRAVENOUS

## 2020-10-26 MED ORDER — ONDANSETRON HCL 4 MG/2ML IJ SOLN
INTRAMUSCULAR | Status: DC | PRN
  Administered 2020-10-26: 4 mg via INTRAVENOUS

## 2020-10-26 MED ORDER — LACTATED RINGERS IV SOLN: INTRAVENOUS | Status: DC

## 2020-10-26 MED ORDER — PROPOFOL 10 MG/ML IV BOLUS
INTRAVENOUS | Status: DC | PRN
  Administered 2020-10-26: 90 mg via INTRAVENOUS

## 2020-10-26 NOTE — Anesthesia Postprocedure Evaluation (Signed)
Anesthesia Post Note  Patient: Kayla [name]  Procedure(s) Performed: VIDEO BRONCHOSCOPY WITH FLUORO BRONCHIAL WASHINGS BRONCHIAL BIOPSIES BRONCHIAL BRUSHINGS     Patient location during evaluation: Endoscopy Anesthesia Type: General Level of consciousness: awake and alert Pain management: pain level controlled Vital Signs Assessment: post-procedure vital signs reviewed and stable Respiratory status: spontaneous breathing, nonlabored ventilation, respiratory function stable and patient connected to nasal cannula oxygen Cardiovascular status: blood pressure returned to baseline and stable Postop Assessment: no apparent nausea or vomiting Anesthetic complications: no   No notable events documented.  Last Vitals:  Vitals:   10/26/20 1004 10/26/20 1007  BP:  (!) 119/41  Pulse: 88 82  Resp: 20 (!) 23  Temp:    SpO2: 99% 95%    Last Pain:  Vitals:   10/26/20 0939  TempSrc:   PainSc: 0-No pain                 Barnet Glasgow

## 2020-10-26 NOTE — Op Note (Signed)
Video Bronchoscopy Procedure Note  Date of Operation: 10/26/2020  Pre-op Diagnosis: Infiltrates  Post-op Diagnosis: Infiltrates   Surgeon: Garner Nash, DO   Assistants: none  Anesthesia: general   Operation: Flexible video fiberoptic bronchoscopy and biopsies.  Estimated Blood Loss: <4YH  Complications: none noted  Indications and History: Kayla [name] is 70 y.o. with history of bilateral infiltrates, cough.  Recommendation was to perform video fiberoptic bronchoscopy with biopsies. The risks, benefits, complications, treatment options and expected outcomes were discussed with the patient.  The possibilities of pneumothorax, pneumonia, reaction to medication, pulmonary aspiration, perforation of a viscus, bleeding, failure to diagnose a condition and creating a complication requiring transfusion or operation were discussed with the patient who freely signed the consent.    Description of Procedure: The patient was seen in the Preoperative Area, was examined and was deemed appropriate to proceed.  The patient was taken to [facility] endoscopy room 1, identified as [name] and the procedure verified as Flexible Video Fiberoptic Bronchoscopy.  A Time Out was held and the above information confirmed.   Videobronchoscope was inserted to the patient's endotracheal tube and general inspection was performed which showed normal cords, normal trachea, normal main carina. The R sided airways were inspected and showed normal RUL, BI, RML and RLL. The L side was then inspected. The LLL, Lingular and LUL airways were normal.  Upon initial examination there was a thick amount of layered mucus within the left mainstem and the distal subsegments of the left lower lobe were fully occluded with white mucous plugs.  Bilateral mainstem's in the left lower lobe was aspirated to clear for removal of any remaining debris secretions and mucous plugs.  Saline was used for irrigation.  Once all distal airways  were visible there was no evidence of endobronchial lesion.  A large volume BAL with 120 cc of saline was instilled into the left upper lobe with moderate return.  Additionally we collected a left lower lobe BAL in the same fashion.  Under fluoroscopy brushing samples were obtained from the left upper lobe and left lower lobe.  Both to be sent for cytology.  Also under fluoroscopy transbronchial biopsies were obtained with a 2.8 mm [employer] forceps for the left upper lobe and left lower lobe.  Both locations to be sent for cytology/pathology evaluation.  BAL specimens to be sent for cell count and cultures.  Samples: 1.  Transbronchial brushings from left upper lobe and left lower lobe 2. Endobronchial forceps biopsies from left upper lobe, left lower lobe 3. Bronchial washings from left mainstem, left upper lobe and left lower lobe  Plans:  We will review the cytology, pathology and microbiology results with the patient when they become available.  Outpatient followup will be with Dr Chase Caller.   Garner Nash, DO [facility] 10/26/2020 9:56 AM

## 2020-10-26 NOTE — Anesthesia Procedure Notes (Signed)
Procedure Name: Intubation Date/Time: 10/26/2020 8:54 AM Performed by: Inda Coke, CRNA Pre-anesthesia Checklist: Patient identified, Emergency Drugs available, Suction available and Patient being monitored Patient Re-evaluated:Patient Re-evaluated prior to induction Oxygen Delivery Method: Circle System Utilized Preoxygenation: Pre-oxygenation with 100% oxygen Induction Type: IV induction Ventilation: Mask ventilation without difficulty and Oral airway inserted - appropriate to patient size Laryngoscope Size: Mac and 4 Grade View: Grade I Tube type: Oral Tube size: 8.5 mm Number of attempts: 1 Airway Equipment and Method: Stylet and Oral airway Placement Confirmation: ETT inserted through vocal cords under direct vision, positive ETCO2 and breath sounds checked- equal and bilateral Secured at: 22 cm Tube secured with: Tape Dental Injury: Teeth and Oropharynx as per pre-operative assessment

## 2020-10-26 NOTE — Interval H&P Note (Signed)
History and Physical Interval Note:  10/26/2020 8:23 AM  Kayla [name]  has presented today for surgery, with the diagnosis of LLL consildation.  The various methods of treatment have been discussed with the patient and family. After consideration of risks, benefits and other options for treatment, the patient has consented to  Procedure(s): VIDEO BRONCHOSCOPY WITH FLUORO (N/A) as a surgical intervention.  [name] history has been reviewed, patient examined, no change in status, stable for surgery.  I have reviewed the patient's chart and labs.  Questions were answered to the patient's satisfaction.     [place]

## 2020-10-26 NOTE — Transfer of Care (Signed)
Immediate Anesthesia Transfer of Care Note  Patient: Kayla [name]  Procedure(s) Performed: VIDEO BRONCHOSCOPY WITH FLUORO BRONCHIAL WASHINGS BRONCHIAL BIOPSIES BRONCHIAL BRUSHINGS  Patient Location: Endoscopy Unit  Anesthesia Type:General  Level of Consciousness: awake and alert   Airway & Oxygen Therapy: Patient Spontanous Breathing and Patient connected to face mask oxygen  Post-op Assessment: Report given to RN and Post -op Vital signs reviewed and stable  Post vital signs: Reviewed and stable  Last Vitals:  Vitals Value Taken Time  BP 149/54 10/26/20 0939  Temp    Pulse 98 10/26/20 0940  Resp 23 10/26/20 0940  SpO2 95 % 10/26/20 0940  Vitals shown include unvalidated device data.  Last Pain:  Vitals:   10/26/20 0939  TempSrc:   PainSc: 0-No pain         Complications: No notable events documented.

## 2020-10-26 NOTE — Discharge Instructions (Addendum)
  What can I expect after the procedure? Your blood pressure, heart rate, breathing rate, and blood oxygen level will be monitored until you leave the hospital or clinic. You may have a chest X-ray to check for signs of pneumothorax. You willnot be allowed to eat or drink anything for 2 hours after your procedure. If a biopsy was taken, it is up to you to get the results of the test. Ask your health care provider, or the department that is doing the procedure, when your results will be ready. You may have the following symptoms for 24-48 hours: A cough that is worse than it was before the procedure. A low-grade fever. A sore throat or hoarse voice. Some blood in the mucus from your lungs (sputum), if a biopsy was done. Follow these instructions at home: Eating and drinking Do not eat or drink anything, including water, for 2 hours after your procedure, or until your numbing medicine has worn off. Having a numb throat increases your risk of burning yourself or choking. Start eating soft foods and slowly drinking liquids after your numbness is gone and your cough and gag reflexes have returned. You may return to your normal diet the day after the procedure. Driving If you were given a sedative during the procedure, it can affect you for several hours. Do not drive or operate machinery until your health care provider says that it is safe. Ask your health care provider if the medicine prescribed to you requires you to avoid driving or using machinery. Return to your normal activities as told by your health care provider. Ask your health care provider what activities are safe for you. General instructions  Take over-the-counter and prescription medicines only as told by your health care provider. Do not use any products that contain nicotine or tobacco. These products include cigarettes, chewing tobacco, and vaping devices, such as e-cigarettes. If you need help quitting, ask your health care  provider. Keep all follow-up visits. This is important.   Get help right away if: You have shortness of breath that gets worse. You become light-headed or feel like you might faint. You have chest pain. You cough up more than a small amount of blood. These symptoms may represent a serious problem that is an emergency. Do not wait to see if the symptoms will go away. Get medical help right away. Call your local emergency services (911 in the [place] Do not drive yourself to the hospital. Summary Flexible bronchoscopy is a procedure that allows your health care provider to look closely inside your lungs and to take testing samples if needed. Risks of flexible bronchoscopy include bleeding, infection, and collapsed lung (pneumothorax). Before the procedure, you will be given a medicine to numb your mouth, nose, throat, and voice box. Then, a bronchoscope will be passed into your nose or mouth, and into your lungs. After the procedure, your blood pressure, heart rate, breathing rate, and blood oxygen level will be monitored until you leave the hospital or clinic. You may have a chest X-ray to check for signs of pneumothorax. You will not be allowed to eat or drink anything for 2 hours after your procedure. This information is not intended to replace advice given to you by your health care provider. Make sure you discuss any questions you have with your healthcare provider.

## 2020-10-27 LAB — ACID FAST SMEAR (AFB, MYCOBACTERIA)
Acid Fast Smear: NEGATIVE
Acid Fast Smear: NEGATIVE
Acid Fast Smear: NEGATIVE

## 2020-10-28 LAB — CULTURE, BLOOD (ROUTINE X 2)
Culture: NO GROWTH
Culture: NO GROWTH
Special Requests: ADEQUATE
Special Requests: ADEQUATE

## 2020-10-30 ENCOUNTER — Other Ambulatory Visit: Payer: Self-pay

## 2020-10-30 ENCOUNTER — Encounter: Payer: Self-pay | Admitting: Adult Health

## 2020-10-30 ENCOUNTER — Ambulatory Visit (INDEPENDENT_AMBULATORY_CARE_PROVIDER_SITE_OTHER): Payer: Medicare Other | Admitting: Adult Health

## 2020-10-30 DIAGNOSIS — J383 Other diseases of vocal cords: Secondary | ICD-10-CM

## 2020-10-30 DIAGNOSIS — G35 Multiple sclerosis: Secondary | ICD-10-CM | POA: Diagnosis not present

## 2020-10-30 DIAGNOSIS — F988 Other specified behavioral and emotional disorders with onset usually occurring in childhood and adolescence: Secondary | ICD-10-CM | POA: Diagnosis not present

## 2020-10-30 DIAGNOSIS — I75023 Atheroembolism of bilateral lower extremities: Secondary | ICD-10-CM | POA: Diagnosis not present

## 2020-10-30 DIAGNOSIS — B999 Unspecified infectious disease: Secondary | ICD-10-CM | POA: Diagnosis not present

## 2020-10-30 DIAGNOSIS — J189 Pneumonia, unspecified organism: Secondary | ICD-10-CM | POA: Diagnosis not present

## 2020-10-30 DIAGNOSIS — J45991 Cough variant asthma: Secondary | ICD-10-CM

## 2020-10-30 DIAGNOSIS — N319 Neuromuscular dysfunction of bladder, unspecified: Secondary | ICD-10-CM | POA: Diagnosis not present

## 2020-10-30 DIAGNOSIS — F32A Depression, unspecified: Secondary | ICD-10-CM | POA: Diagnosis not present

## 2020-10-30 DIAGNOSIS — D849 Immunodeficiency, unspecified: Secondary | ICD-10-CM | POA: Diagnosis not present

## 2020-10-30 DIAGNOSIS — G4700 Insomnia, unspecified: Secondary | ICD-10-CM | POA: Diagnosis not present

## 2020-10-30 LAB — CYTOLOGY - NON PAP

## 2020-10-30 MED ORDER — BENZONATATE 200 MG PO CAPS: 200.0000 mg | ORAL_CAPSULE | Freq: Three times a day (TID) | ORAL | 0 refills | Status: DC | PRN

## 2020-10-30 NOTE — Assessment & Plan Note (Signed)
Aspiration precautions Follow up with ENT as planned

## 2020-10-30 NOTE — Assessment & Plan Note (Signed)
Continue on Dulera and trigger prevention   Plan  Patient Instructions  Robitussin DM 2 tsp every 4hrs for cough As needed   Tessalon Three times a day  for cough as needed  Flutter valve Three times a day   Zyrtec '10mg'$  At bedtime  As needed  drainage.  Continue on Dulera 2 puffs Twice daily   Follow up in 1 week and As needed   Please contact office for sooner follow up if symptoms do not improve or worsen or seek emergency care

## 2020-10-30 NOTE — Telephone Encounter (Signed)
Kayla [name], Patient sent you a update from her video visit with her [facility] provider. You had OV with her at 0930 today.  Just had a video conference with Kayla [name]. My immugologin (sp) levels have been low but bc I had not had any reoccurring infections until now Dr . Hinton Rao (specialist) in [place] saw no reason to treat. Waiting on appt to see him asap. Dr. Dellis Filbert starting me on bactrim and augmentin asap. Stopping Ocrevus  Message routed to Thomas, NP as [employer]

## 2020-10-30 NOTE — Patient Instructions (Addendum)
Robitussin DM 2 tsp every 4hrs for cough As needed   Tessalon Three times a day  for cough as needed  Flutter valve Three times a day   Zyrtec '10mg'$  At bedtime  As needed  drainage.  Continue on Dulera 2 puffs Twice daily   Follow up in 1 week and As needed   Please contact office for sooner follow up if symptoms do not improve or worsen or seek emergency care

## 2020-10-30 NOTE — Assessment & Plan Note (Signed)
Continue with follow up with Neurology

## 2020-10-30 NOTE — Progress Notes (Signed)
_0  ID: Kayla [name], female    DOB: 06-13-1950, 70 y.o.   MRN: [id number]  Chief Complaint  Patient presents with   Hospitalization Follow-up    Referring provider: Ronnald Nian, DO  HPI: 70 year old female never smoker followed for asthma Medical history significant for multiple sclerosis, bilateral vocal fold paralysis  TEST/EVENTS :   10/30/2020 Follow up : Asthma , post hospital follow-up Patient returns for a follow-up visit.  Patient has been hospitalized twice over the last 6 weeks.  Complains that beginning in March 2022 started having increased cough with severe coughing paroxsyms and dyspnea, decreased activity tolerance. She was initially admitted early July for community-acquired pneumonia she was treated with IV antibiotics and pulmonary hygiene regimen.  Patient was seen by speech therapy with no evidence of aspiration but felt that she was at risk for aspiration.  CT chest September 27, 2020 showed groundglass opacities in the right lower lobe and left lower lobe concerning for multifocal pneumonia.  14 x 10 mm nodular density in the right middle lobe.  Patient was discharged home and continued to have ongoing symptoms.  She was readmitted early August with ongoing cough.  Modified barium swallow was negative.  CT scan on August 3 showed groundglass attenuation and consolidation-waxing and waning groundglass attenuation.  Autoimmune work-up was negative.  fungal culture was negative, AFB negative.  BAL cx shows rare streptococcus constellatus . Cytology pending . ESR elevated at 80.  Since discharge patient says that she continues to have ongoing cough.  Has noticed over the last 2 to 3 days that cough is only slightly improved.  She remains very weak with low activity tolerance.  She has decreased energy.  She does cough up thick mucus large amounts at times.     Allergies  Allergen Reactions   Tramadol     hallucinations    Contrast Media [Iodinated [employer]] Rash   Iohexol Hives   Methylprednisolone Other (See Comments)    Reaction:  Decreases pts heart rate    Morphine And Related Other (See Comments)    Reaction:  Hallucinations    Oxycodone-Acetaminophen Rash and Other (See Comments)    Reaction:  Hallucinations    Phenytoin Sodium Extended Rash   Zanaflex [Tizanidine Hydrochloride] Rash    Immunization History  Administered Date(s) Administered   DTP 04/10/2009   Fluad Quad(high Dose 65+) 12/29/2018   Influenza Split 12/10/2010, 02/11/2012, 12/22/2012, 01/02/2014, 12/22/2014   Influenza Whole 02/19/2005, 03/28/2009, 01/23/2016   Influenza, High Dose Seasonal PF 02/23/2018   Influenza,inj,Quad PF,6+ Mos 02/10/2017   Influenza-Unspecified 12/02/2018, 12/31/2018, 01/08/2019, 12/22/2019   Moderna Sars-Covid-2 Vaccination 04/29/2019, 06/01/2019   Pneumococcal Conjugate-13 11/06/2015   Pneumococcal Polysaccharide-23 03/28/2009, 04/26/2019   Tdap 04/26/2019   Zoster Recombinat (Shingrix) 09/07/2019, 02/17/2020    Past Medical History:  Diagnosis Date   Arthritis    oa   Cervical cancer ([place]) 2005   COPD (chronic obstructive pulmonary disease) ([place])    Multiple sclerosis ([place])    Osteoporosis    Other diseases of vocal cords    vocal cords are not even   Other voice and resonance disorders    Pneumonia 2014, 1992   Sleep apnea    no cpap used   Unspecified hypothyroidism     Tobacco History: Social History   Tobacco Use  Smoking Status Never  Smokeless Tobacco Never   Counseling given: Not Answered   Outpatient Medications Prior to Visit  Medication Sig Dispense Refill  acetaminophen (TYLENOL) 500 MG tablet Take 500 mg by mouth every 6 (six) hours as needed for mild pain, fever or headache.     amphetamine-dextroamphetamine (ADDERALL) 20 MG tablet Take 20 mg by mouth daily.     baclofen (LIORESAL) 20 MG tablet Take 20 mg by mouth 3 (three) times daily as needed for muscle spasms.     benzonatate (TESSALON)  200 MG capsule Take 1 capsule (200 mg total) by mouth 3 (three) times daily. 20 capsule 0   CALCIUM PO Take 1 tablet by mouth daily.     dalfampridine 10 MG TB12 Take 10 mg by mouth 2 (two) times daily.     dexlansoprazole (DEXILANT) 60 MG capsule TAKE 1 CAPSULE BY MOUTH  ONCE DAILY WITH BREAKFAST 90 capsule 2   famotidine (PEPCID) 20 MG tablet One after supper 30 tablet 11   FLUoxetine (PROZAC) 40 MG capsule Take 40 mg by mouth daily.     furosemide (LASIX) 20 MG tablet TAKE 1 TABLET BY MOUTH  DAILY 90 tablet 3   gabapentin (NEURONTIN) 600 MG tablet Take 1 tablet (600 mg total) by mouth 3 (three) times daily. 90 tablet 2   levothyroxine (SYNTHROID) 100 MCG tablet TAKE 1 TABLET BY MOUTH ONCE DAILY BEFORE BREAKFAST 90 tablet 3   mometasone-formoterol (DULERA) 100-5 MCG/ACT AERO USE 2 PUFFS EVERY 12 HOURS. 3 each 3   Multiple Vitamin (MULTIVITAMIN WITH MINERALS) TABS tablet Take 1 tablet by mouth daily.     MYRBETRIQ 25 MG TB24 tablet Take 25 mg by mouth daily.     ocrelizumab (OCREVUS) 300 MG/10ML injection Inject 300 mg into the vein every 6 (six) months.      pilocarpine (SALAGEN) 7.5 MG tablet Take 7.5 mg by mouth 2 (two) times daily.     Probiotic Product (PROBIOTIC PO) Take 1 capsule by mouth daily. Harmony probiotic     Respiratory Therapy Supplies (FLUTTER) DEVI Use as directed 1 each 0   tamsulosin (FLOMAX) 0.4 MG CAPS capsule Take 0.4 mg by mouth daily.     temazepam (RESTORIL) 15 MG capsule Take 15 mg by mouth at bedtime.     VENTOLIN HFA 108 (90 Base) MCG/ACT inhaler INHALE 2 PUFFS INTO LUNGS EVERY 6 HOURS AS NEEDED. 18 g 0   No facility-administered medications prior to visit.     Review of Systems:   Constitutional:   No  weight loss, night sweats,  Fevers, chills, + fatigue, or  lassitude.  HEENT:   No headaches,  Difficulty swallowing,  Tooth/dental problems, or  Sore throat,                No sneezing, itching, ear ache, nasal congestion, post nasal drip,   CV:  No  chest pain,  Orthopnea, PND, swelling in lower extremities, anasarca, dizziness, palpitations, syncope.   GI  No heartburn, indigestion, abdominal pain, nausea, vomiting, diarrhea, change in bowel habits, loss of appetite, bloody stools.   Resp: .  No chest wall deformity  Skin: no rash or lesions.  GU: no dysuria, change in color of urine, no urgency or frequency.  No flank pain, no hematuria   MS:  No joint pain or swelling.  No decreased range of motion.  No back pain.    Physical Exam  BP (!) 120/58 (BP Location: Right Arm, Patient Position: Sitting, Cuff Size: Normal)   Pulse 96   Temp 97.8 F (36.6 C) (Oral)   Ht 5' 3" (1.6 m)   Wt 113 lb (  51.3 kg)   SpO2 95%   BMI 20.02 kg/m   GEN: A/Ox3; pleasant , NAD, frail appearing    HEENT:  [place]/AT,  l, NOSE-clear, THROAT-clear, no lesions, no postnasal drip or exudate noted.   NECK:  Supple w/ fair ROM; no JVD; normal carotid impulses w/o bruits; no thyromegaly or nodules palpated; no lymphadenopathy.  Upper airway psuedowheeze with inspiration/expiration   RESP  scattered rhonchi ,  no accessory muscle use, no dullness to percussion  CARD:  RRR, no m/r/g, no peripheral edema, pulses intact, no cyanosis or clubbing.  GI:   Soft & nt; nml bowel sounds; no organomegaly or masses detected.   Musco: Warm bil, no deformities or joint swelling noted.   Neuro: alert, no focal deficits noted.    Skin: Warm, no lesions or rashes    Lab Results:  CBC    Component Value Date/Time   WBC 10.3 10/24/2020 0504   RBC 3.41 (L) 10/24/2020 0504   HGB 9.3 (L) 10/24/2020 0504   HCT 29.6 (L) 10/24/2020 0504   PLT 462 (H) 10/24/2020 0504   MCV 86.8 10/24/2020 0504   MCH 27.3 10/24/2020 0504   MCHC 31.4 10/24/2020 0504   RDW 14.3 10/24/2020 0504   LYMPHSABS 1.4 10/23/2020 1328   MONOABS 1.4 (H) 10/23/2020 1328   EOSABS 0.1 10/23/2020 1328   BASOSABS 0.1 10/23/2020 1328    BMET    Component Value Date/Time   NA 133 (L)  10/23/2020 1328   K 3.5 10/23/2020 1328   CL 97 (L) 10/23/2020 1328   CO2 25 10/23/2020 1328   GLUCOSE 87 10/23/2020 1328   BUN 10 10/23/2020 1328   CREATININE 0.58 10/23/2020 1328   CALCIUM 9.0 10/23/2020 1328   GFRNONAA >60 10/23/2020 1328   GFRAA >60 06/08/2017 0704    BNP    Component Value Date/Time   BNP 129.3 (H) 10/23/2020 1231    ProBNP    Component Value Date/Time   PROBNP 40.0 08/08/2015 1622    Imaging: DG SWALLOW FUNC OP MEDICARE SPEECH PATH  Result Date: 10/15/2020 Formatting of this result is different from the original. Objective Swallowing Evaluation: Type of Study: MBS-Modified Barium Swallow Study  Patient Details Name: RAIHANA BALDERRAMA MRN: [id number] Date of Birth: Jan 16, 1951 Today's Date: 10/15/2020 Time: SLP Start Time (ACUTE ONLY): 1140 -SLP Stop Time (ACUTE ONLY): 1210 SLP Time Calculation (min) (ACUTE ONLY): 30 min Past Medical History: Past Medical History: Diagnosis Date  Arthritis   oa  Cervical cancer ([place]) 2005  COPD (chronic obstructive pulmonary disease) ([place])   Multiple sclerosis ([place])   Osteoporosis   Other diseases of vocal cords   vocal cords are not even  Other voice and resonance disorders   Pneumonia 2014, 1992  Sleep apnea   no cpap used  Unspecified hypothyroidism  Past Surgical History: Past Surgical History: Procedure Laterality Date  ABDOMINAL HYSTERECTOMY  03/25/2003  for cervical cancer, complete  CATARACT EXTRACTION, BILATERAL Bilateral   lens for cataracts  CONVERSION TO TOTAL HIP Right 09/18/2015  Procedure: CONVERSION OF RIGHT HEMI ARTHROPLASTY TO TOTAL HIP ARTHROPLASTY ACETABULAR REVISON ;  Surgeon: Paralee Cancel, MD;  Location: [facility];  Service: Orthopedics;  Laterality: Right;  HIP ARTHROPLASTY Right 03/01/2013  Procedure: ARTHROPLASTY BIPOLAR HIP;  Surgeon: Mauri Pole, MD;  Location: [facility];  Service: Orthopedics;  Laterality: Right;  HIP CLOSED REDUCTION Right 03/12/2013  Procedure: CLOSED REDUCTION HIP;  Surgeon: Gearlean Alf, MD;   Location: [facility];  Service: Orthopedics;  Laterality: Right;  HIP CLOSED REDUCTION Right 03/19/2013  Procedure: CLOSED REDUCTION HIP;  Surgeon: Johnn Hai, MD;  Location: [facility];  Service: Orthopedics;  Laterality: Right;  HIP CLOSED REDUCTION Right 11/10/2015  Procedure: CLOSED MANIPULATION HIP;  Surgeon: Rod Can, MD;  Location: [facility];  Service: Orthopedics;  Laterality: Right;  HIP CLOSED REDUCTION Right 06/05/2017  Procedure: CLOSED REDUCTION HIP;  Surgeon: [name], MD;  Location: [facility];  Service: Orthopedics;  Laterality: Right;  TOTAL HIP REVISION Right 06/06/2017  Procedure: TOTAL HIP REVISION;  Surgeon: [name], MD;  Location: [facility];  Service: Orthopedics;  Laterality: Right;  VESICOVAGINAL FISTULA CLOSURE W/ TAH  03/25/2003 HPI: 70 yo female with h/o COPD, MS, OA, hypothyroidism, GERD, bilateral vocal fold paralysis (since 2015) referred for OP MBS.  She had recent admission to [facility] (7/7-09/29/20) for pna. She has had ongoing cough since March. She is being followed by Dr. Melvyn Novas at [facility] and Dr. [name], ENT.  GI f/u on 7/14, with subsequent recommendations for MBS, EGD and colonoscopy.  Pt underwent clinical swallow eval 09/28/20 during admission to [facility] - findings suggested no overt concerns for aspiration but recommended GI f/u for reflux and potential relationship to aspiration pna.  Pt had two prior MBS in December of 2014 after a fall requiring CIR.  At that time she demonstrated residuals in pharynx and decreased UES patency. During pre-assessment today, pt endorsed coughing during and after meals as well as throughout the day, trouble swallowing pills, sensation of food sticking in throat with every meal.  Subjective: alert and good historian Assessment / Plan / Recommendation CHL IP CLINICAL IMPRESSIONS 10/15/2020 Clinical Impression Pt's swallow function, upon reviewing documentation from 2014 MBS studies, does not appear to have changed dramatically.  She continues  to present with solid food residue that sits in the valleculae and pyriform sinuses - it is sensed and cleared with a secondary swallow.  There was intermittent incomplete closure of the epiglottis over the vestibule and some reduced patency of the UES - this may be due in part to the degenerative changes in the cervical vertebrae (documented in 9/21 MR spine) and the resultant potential narrowing of the pharyngeal space.  Pt achieved effective laryngeal closure regardless, and there was no aspiration despite large, sequential thin liquid boluses and mixed solid/liquid consistencies. There was a notable CP bar, which played a part in capturing some residue superior to its surface.  Pt should continue to eat regular solids and drink thin liquids; she is already using f/u sub-swallows to clear pharynx. Potentially pt could have issues with pharyngeal residue spilling into larynx/trachea, but she demonstrated spontaneous compensatory strategies that helped reduce the residue.  Study was reviewed in real time; pt will f/u with GI. SLP Visit Diagnosis Dysphagia, pharyngeal phase (R13.13) Attention and concentration deficit following -- Frontal lobe and executive function deficit following -- Impact on safety and function --   CHL IP TREATMENT RECOMMENDATION 10/15/2020 Treatment Recommendations No treatment recommended at this time   Prognosis 03/22/2013 Prognosis for Safe Diet Advancement Good Barriers to Reach Goals -- Barriers/Prognosis Comment -- CHL IP DIET RECOMMENDATION 10/15/2020 SLP Diet Recommendations Regular solids;Thin liquid Liquid Administration via Cup;Straw Medication Administration Other (Comment) Compensations -- Postural Changes --   CHL IP OTHER RECOMMENDATIONS 10/15/2020 Recommended Consults -- Oral Care Recommendations Oral care BID Other Recommendations --   CHL IP FOLLOW UP RECOMMENDATIONS 10/15/2020 Follow up Recommendations None   CHL IP FREQUENCY AND DURATION 03/22/2013 Speech Therapy Frequency  (  ACUTE ONLY) min 5x/week Treatment Duration 2 weeks      CHL IP ORAL PHASE 10/15/2020 Oral Phase WFL Oral - Pudding Teaspoon -- Oral - Pudding Cup -- Oral - Honey Teaspoon -- Oral - Honey Cup -- Oral - Nectar Teaspoon -- Oral - Nectar Cup -- Oral - Nectar Straw -- Oral - Thin Teaspoon -- Oral - Thin Cup -- Oral - Thin Straw -- Oral - Puree -- Oral - Mech Soft -- Oral - Regular -- Oral - Multi-Consistency -- Oral - Pill -- Oral Phase - Comment --  CHL IP PHARYNGEAL PHASE 10/15/2020 Pharyngeal Phase Impaired Pharyngeal- Pudding Teaspoon -- Pharyngeal -- Pharyngeal- Pudding Cup -- Pharyngeal -- Pharyngeal- Honey Teaspoon -- Pharyngeal -- Pharyngeal- Honey Cup -- Pharyngeal -- Pharyngeal- Nectar Teaspoon -- Pharyngeal -- Pharyngeal- Nectar Cup -- Pharyngeal -- Pharyngeal- Nectar Straw -- Pharyngeal -- Pharyngeal- Thin Teaspoon -- Pharyngeal -- Pharyngeal- Thin Cup Reduced pharyngeal peristalsis;Reduced epiglottic inversion;Pharyngeal residue - valleculae;Pharyngeal residue - pyriform Pharyngeal -- Pharyngeal- Thin Straw -- Pharyngeal -- Pharyngeal- Puree Reduced epiglottic inversion;Pharyngeal residue - valleculae;Pharyngeal residue - pyriform Pharyngeal -- Pharyngeal- Mechanical Soft -- Pharyngeal -- Pharyngeal- Regular Reduced epiglottic inversion;Pharyngeal residue - pyriform;Pharyngeal residue - valleculae Pharyngeal -- Pharyngeal- Multi-consistency -- Pharyngeal -- Pharyngeal- Pill -- Pharyngeal -- Pharyngeal Comment --  No flowsheet data found. Juan Quam Laurice 10/15/2020, 4:03 PM            CLINICAL DATA:  Dysphagia. Cough. Recent aspiration pneumonia, history of MS EXAM: MODIFIED BARIUM SWALLOW TECHNIQUE: Different consistencies of barium were administered orally to the patient by the Speech Pathologist. Imaging of the pharynx was performed in the lateral projection. The radiologist was present in the fluoroscopy room for this study, providing personal supervision. FLUOROSCOPY TIME:  Fluoroscopy Time:  1  minutes 22 seconds Radiation Exposure Index (if provided by the fluoroscopic device): 13.86 COMPARISON:  None. FINDINGS: No aspiration with any attempted consistencies including thin barium, puree, and solid. Pharyngeal retention is present with solid consistency. Prominent cricopharyngeus. IMPRESSION: No aspiration. Please refer to the Speech Pathologists report for complete details and recommendations. Electronically Signed   By: Macy Mis M.D.   On: 10/15/2020 12:12   CT Chest High Resolution  Result Date: 10/25/2020 CLINICAL DATA:  70 year old female with history of chronic cough. History of aspiration. EXAM: CT CHEST WITHOUT CONTRAST TECHNIQUE: Multidetector CT imaging of the chest was performed following the standard protocol without intravenous contrast. High resolution imaging of the lungs, as well as inspiratory and expiratory imaging, was performed. COMPARISON:  Chest CT 09/27/2020. FINDINGS: Cardiovascular: Heart size is normal. There is no significant pericardial fluid, thickening or pericardial calcification. Aortic atherosclerosis. No definite coronary artery calcifications. Mediastinum/Nodes: No pathologically enlarged mediastinal or hilar lymph nodes. Please note that accurate exclusion of hilar adenopathy is limited on noncontrast CT scans. Esophagus is unremarkable in appearance. No axillary lymphadenopathy. Lungs/Pleura: When compared to the prior examination, several of the areas of patchy peribronchovascular ground-glass attenuation and nodular airspace consolidation seen on the prior study have resolved or regressed (particularly in the basal segments of the right lower lobe). However, other areas have dramatically progressed or are new. The most significant progression is in the left lower lobe where there is far more frank consolidation than seen on the prior study, along with increasing areas of ground-glass attenuation. Additionally, in the left upper lobe there are completely new  areas of ground-glass attenuation and peribronchovascular airspace consolidation. Scattered areas of cylindrical bronchiectasis are again noted. Findings are randomly distributed in  the lungs bilaterally and are asymmetric involving the left lung greater than the right. Inspiratory and expiratory imaging demonstrates some mild air trapping indicative of mild small airways disease. No pleural effusions. Upper Abdomen: 1.1 cm low-attenuation lesion between segments 8 and 5, incompletely characterized on today's non-contrast CT examination, but statistically likely to represent a cyst. Musculoskeletal: There are no aggressive appearing lytic or blastic lesions noted in the visualized portions of the skeleton. Old healed fracture of the upper sternum. IMPRESSION: 1. Waxing and waning patterns of ground-glass attenuation and consolidation in the lungs bilaterally, as above, with an unusual appearance which is favored to represent an incompletely treated atypical infection. 2. Aortic atherosclerosis. Aortic Atherosclerosis (ICD10-I70.0). Electronically Signed   By: Vinnie Langton M.D.   On: 10/25/2020 05:35   DG CHEST PORT 1 VIEW  Result Date: 10/26/2020 CLINICAL DATA:  Status post bronchoscopy EXAM: PORTABLE CHEST 1 VIEW COMPARISON:  Chest CT dated October 23, 2020 FINDINGS: Cardiac and mediastinal contours are unchanged. Heterogeneous opacities at the left lung, better evaluated on recent CT, a component of these findings may be related to post biopsy change. No pleural effusion or pneumothorax. Linear opacity of the right lower lung, likely atelectasis. IMPRESSION: No evidence of pleural effusion or pneumothorax. Electronically Signed   By: Yetta Glassman MD   On: 10/26/2020 10:21   DG Chest Portable 1 View  Result Date: 10/23/2020 CLINICAL DATA:  Chest pain with cough and shortness of breath. EXAM: PORTABLE CHEST 1 VIEW COMPARISON:  Radiographs 09/27/2020 and 07/18/2020.  CT 09/27/2020. FINDINGS: 1234 hours.  The heart size and mediastinal contours are stable. There are progressive airspace opacities at the left lung base and left perihilar region. Minimal patchy opacity at the right lung base appears improved. There is no significant pleural effusion or pneumothorax. Old fracture the proximal right humerus appears unchanged. IMPRESSION: Progressive multifocal left lung airspace opacities consistent with progressive multilobar pneumonia. Right basilar involvement appears mildly improved in the interval. Electronically Signed   By: Richardean Sale M.D.   On: 10/23/2020 13:15   DG C-ARM BRONCHOSCOPY  Result Date: 10/26/2020 C-ARM BRONCHOSCOPY: Fluoroscopy was utilized by the requesting physician.  No radiographic interpretation.      No flowsheet data found.  No results found for: NITRICOXIDE      Assessment & Plan:   Multifocal pneumonia Recent hospitalizations x2 for multifocal pneumonia.  CT chest showing a waxing and waning patterns of groundglass attenuation and consolidation of the lungs.  Swallow evaluation did not show overt aspiration but concerns the patient is at risk for aspiration. She was treated with aggressive IV antibiotics and pulmonary hygiene regimen. Autoimmune/connective tissue work-up was negative.  ESR was elevated at 80. Patient underwent bronchoscopy cytology is pending.  Preliminary BAL cx showed rare streptococcoum constellatus .  Concern for possible COP -hold on steroids for now.  We will have close follow-up in 1 week.  Follow-up on culture data and cytology. Continue with aggressive pulmonary hygiene regimen.  Will need follow-up chest x-ray and/or CT going forward.  Plan  Patient Instructions  Robitussin DM 2 tsp every 4hrs for cough As needed   Tessalon Three times a day  for cough as needed  Flutter valve Three times a day   Zyrtec 54m At bedtime  As needed  drainage.  Continue on Dulera 2 puffs Twice daily   Follow up in 1 week and As needed   Please  contact office for sooner follow up if symptoms do not improve or  worsen or seek emergency care        Multiple sclerosis ([place]) Continue with follow up with Neurology   Other diseases of vocal cords Aspiration precautions Follow up with ENT as planned   Cough variant asthma vs vcd  related to MS  Continue on Dulera and trigger prevention   Plan  Patient Instructions  Robitussin DM 2 tsp every 4hrs for cough As needed   Tessalon Three times a day  for cough as needed  Flutter valve Three times a day   Zyrtec 8m At bedtime  As needed  drainage.  Continue on Dulera 2 puffs Twice daily   Follow up in 1 week and As needed   Please contact office for sooner follow up if symptoms do not improve or worsen or seek emergency care        I spent   41 minutes dedicated to the care of this patient on the date of this encounter to include pre-visit review of records, face-to-face time with the patient discussing conditions above, post visit ordering of testing, clinical documentation with the electronic health record, making appropriate referrals as documented, and communicating necessary findings to members of the patients care team.    TRexene Edison NP 10/30/2020

## 2020-10-30 NOTE — Assessment & Plan Note (Signed)
Recent hospitalizations x2 for multifocal pneumonia.  CT chest showing a waxing and waning patterns of groundglass attenuation and consolidation of the lungs.  Swallow evaluation did not show overt aspiration but concerns the patient is at risk for aspiration. She was treated with aggressive IV antibiotics and pulmonary hygiene regimen. Autoimmune/connective tissue work-up was negative.  ESR was elevated at 80. Patient underwent bronchoscopy cytology is pending.  Preliminary BAL cx showed rare streptococcoum constellatus .  Concern for possible COP -hold on steroids for now.  We will have close follow-up in 1 week.  Follow-up on culture data and cytology. Continue with aggressive pulmonary hygiene regimen.  Will need follow-up chest x-ray and/or CT going forward.  Plan  Patient Instructions  Robitussin DM 2 tsp every 4hrs for cough As needed   Tessalon Three times a day  for cough as needed  Flutter valve Three times a day   Zyrtec 34m At bedtime  As needed  drainage.  Continue on Dulera 2 puffs Twice daily   Follow up in 1 week and As needed   Please contact office for sooner follow up if symptoms do not improve or worsen or seek emergency care

## 2020-10-31 LAB — AEROBIC/ANAEROBIC CULTURE W GRAM STAIN (SURGICAL/DEEP WOUND): Culture: NORMAL

## 2020-10-31 NOTE — Telephone Encounter (Signed)
TP please advise. Thanks   Did the cultures reveal anything.

## 2020-11-01 DIAGNOSIS — G35 Multiple sclerosis: Secondary | ICD-10-CM | POA: Diagnosis not present

## 2020-11-01 DIAGNOSIS — D8489 Other immunodeficiencies: Secondary | ICD-10-CM | POA: Diagnosis not present

## 2020-11-01 DIAGNOSIS — J449 Chronic obstructive pulmonary disease, unspecified: Secondary | ICD-10-CM | POA: Diagnosis not present

## 2020-11-01 DIAGNOSIS — J189 Pneumonia, unspecified organism: Secondary | ICD-10-CM | POA: Diagnosis not present

## 2020-11-01 LAB — QUANTIFERON-TB GOLD PLUS

## 2020-11-03 ENCOUNTER — Other Ambulatory Visit: Payer: Self-pay | Admitting: *Deleted

## 2020-11-03 MED ORDER — ALBUTEROL SULFATE HFA 108 (90 BASE) MCG/ACT IN AERS: INHALATION_SPRAY | RESPIRATORY_TRACT | 0 refills | Status: DC

## 2020-11-05 ENCOUNTER — Telehealth: Payer: Self-pay | Admitting: Adult Health

## 2020-11-05 DIAGNOSIS — Z466 Encounter for fitting and adjustment of urinary device: Secondary | ICD-10-CM | POA: Diagnosis not present

## 2020-11-05 DIAGNOSIS — N319 Neuromuscular dysfunction of bladder, unspecified: Secondary | ICD-10-CM | POA: Diagnosis not present

## 2020-11-05 DIAGNOSIS — F988 Other specified behavioral and emotional disorders with onset usually occurring in childhood and adolescence: Secondary | ICD-10-CM | POA: Diagnosis not present

## 2020-11-05 DIAGNOSIS — G4700 Insomnia, unspecified: Secondary | ICD-10-CM | POA: Diagnosis not present

## 2020-11-05 DIAGNOSIS — K117 Disturbances of salivary secretion: Secondary | ICD-10-CM | POA: Diagnosis not present

## 2020-11-05 DIAGNOSIS — F32A Depression, unspecified: Secondary | ICD-10-CM | POA: Diagnosis not present

## 2020-11-05 DIAGNOSIS — K219 Gastro-esophageal reflux disease without esophagitis: Secondary | ICD-10-CM | POA: Diagnosis not present

## 2020-11-05 DIAGNOSIS — J188 Other pneumonia, unspecified organism: Secondary | ICD-10-CM | POA: Diagnosis not present

## 2020-11-05 DIAGNOSIS — Z792 Long term (current) use of antibiotics: Secondary | ICD-10-CM | POA: Diagnosis not present

## 2020-11-05 DIAGNOSIS — J440 Chronic obstructive pulmonary disease with acute lower respiratory infection: Secondary | ICD-10-CM | POA: Diagnosis not present

## 2020-11-05 DIAGNOSIS — Z452 Encounter for adjustment and management of vascular access device: Secondary | ICD-10-CM | POA: Diagnosis not present

## 2020-11-05 DIAGNOSIS — D511 Vitamin B12 deficiency anemia due to selective vitamin B12 malabsorption with proteinuria: Secondary | ICD-10-CM | POA: Diagnosis not present

## 2020-11-05 DIAGNOSIS — G35 Multiple sclerosis: Secondary | ICD-10-CM | POA: Diagnosis not present

## 2020-11-05 DIAGNOSIS — D8989 Other specified disorders involving the immune mechanism, not elsewhere classified: Secondary | ICD-10-CM | POA: Diagnosis not present

## 2020-11-05 DIAGNOSIS — E039 Hypothyroidism, unspecified: Secondary | ICD-10-CM | POA: Diagnosis not present

## 2020-11-05 NOTE — Telephone Encounter (Signed)
Pt stated that she was informed that she was going to have an appointment with Nurse Parrett tomorrow but when she was checking out last week she was informed that Nurse Parrett's nurse would have to reach out to her to get the appt scheduled because she didn't have anything available so it would have to be a double book, but pt stated nobody has reached out to her.   Pls regard; [phone].   **Will route as high priority being that it is for tomorrow**

## 2020-11-05 NOTE — Telephone Encounter (Signed)
Called and spoke with patient, scheduled her a f/u with TP on 11/08/20 at 10 am, advised to arrive by 9:45 am.  She verbalized understanding.  Nothing further needed.

## 2020-11-06 DIAGNOSIS — G35 Multiple sclerosis: Secondary | ICD-10-CM | POA: Diagnosis not present

## 2020-11-08 ENCOUNTER — Encounter: Payer: Self-pay | Admitting: Adult Health

## 2020-11-08 ENCOUNTER — Ambulatory Visit (INDEPENDENT_AMBULATORY_CARE_PROVIDER_SITE_OTHER)
Admission: RE | Admit: 2020-11-08 | Discharge: 2020-11-08 | Disposition: A | Discharge status: Home / Self Care | Payer: Medicare Other | Source: Ambulatory Visit | Attending: Adult Health | Admitting: Adult Health

## 2020-11-08 ENCOUNTER — Other Ambulatory Visit: Payer: Self-pay

## 2020-11-08 ENCOUNTER — Ambulatory Visit (INDEPENDENT_AMBULATORY_CARE_PROVIDER_SITE_OTHER): Payer: Medicare Other | Admitting: Adult Health

## 2020-11-08 VITALS — BP 116/60 | HR 89 | Temp 97.4°F | Ht 63.0 in | Wt 114.2 lb

## 2020-11-08 DIAGNOSIS — I75023 Atheroembolism of bilateral lower extremities: Secondary | ICD-10-CM

## 2020-11-08 DIAGNOSIS — J45991 Cough variant asthma: Secondary | ICD-10-CM

## 2020-11-08 DIAGNOSIS — J189 Pneumonia, unspecified organism: Secondary | ICD-10-CM

## 2020-11-08 NOTE — Progress Notes (Signed)
_0  ID: Kayla [name], female    DOB: 1951/01/31, 69 y.o.   MRN: [id number]  Chief Complaint  Patient presents with   Follow-up    Referring provider: Janith Lima, MD  HPI: 70 year old female never smoker followed for asthma Medical history significant for MS, bilateral vocal cord paralysis   TEST/EVENTS :   11/08/2020 Follow up : Asthma, Pneumonia  Patient returns for a 1 week follow-up.  Patient has been hospitalized twice over the last 2 months.  She started having increased respiratory symptoms in March 2022.  She was initially admitted early July for community-acquired pneumonia treated with IV antibiotics and a pulmonary hygiene regimen.  She was seen by speech therapy with no evidence of aspiration.  But felt that she was at risk for aspiration.  Was recommended for aspiration precautions. CT chest September 27, 2020 showed groundglass opacities in the right lower lobe and left lower lobe concerning for multifocal pneumonia.  14 x 10 mm nodular density in the right middle lobe.  Patient was discharged home but continued to have ongoing symptoms and was readmitted early August with ongoing cough and congestion.  Modified barium swallow was negative.  CT chest on August 3 showed groundglass attenuation consolidation with waxing and waning groundglass attenuation.  Autoimmune work-up was negative.  Fungal cultures were negative.  AFB sputum was negative.  BAL cultures showed rare Streptococcus Constellatus.  (Sensitive to ceftriaxone, levofloxacin and vancomycin.   intermediate to penicillin).  Sed rate was elevated at 80. Cytology showed reactive bronchial cells.  Benign lung tissue. Referred to Immunology  by her Neurologist . Dr. Madie Reno Levester [facility] [place] . Seen 8/11, started on Rocephin IV Twice daily . HH started Midliine IV . (Left Upper arm) . To take 21 days.  She is on Ocrevus for MS . - right now , due in October. May hold to recurrent infections. Neuro to decide.    Since last visit , feeling a lot better. Energy slightly better. Trying to eat more, appetite is fair.  Throat irritation some better. Cough some better. Less dyspnea. Remains weak but some better this week.   Covid vaccine x 4 .    Allergies  Allergen Reactions   Tramadol     hallucinations    Contrast Media [Iodinated Diagnostic Agents] Rash   Iohexol Hives   Morphine And Related Other (See Comments)    Reaction:  Hallucinations    Methylprednisolone Other (See Comments)    Reaction:  Decreases pts heart rate    Oxycodone-Acetaminophen Rash and Other (See Comments)    Reaction:  Hallucinations    Phenytoin Sodium Extended Rash   Zanaflex [Tizanidine Hydrochloride] Rash    Immunization History  Administered Date(s) Administered   DTP 04/10/2009   Fluad Quad(high Dose 65+) 12/29/2018   Influenza Split 12/10/2010, 02/11/2012, 12/22/2012, 01/02/2014, 12/22/2014   Influenza Whole 02/19/2005, 03/28/2009, 01/23/2016   Influenza, High Dose Seasonal PF 02/23/2018   Influenza,inj,Quad PF,6+ Mos 02/10/2017   Influenza-Unspecified 12/02/2018, 12/31/2018, 01/08/2019, 12/22/2019   Moderna Sars-Covid-2 Vaccination 04/29/2019, 06/01/2019   Pneumococcal Conjugate-13 11/06/2015   Pneumococcal Polysaccharide-23 03/28/2009, 04/26/2019   Tdap 04/26/2019   Zoster Recombinat (Shingrix) 09/07/2019, 02/17/2020    Past Medical History:  Diagnosis Date   Arthritis    oa   Cervical cancer ([place]) 2005   COPD (chronic obstructive pulmonary disease) ([place])    Multiple sclerosis ([place])    Osteoporosis    Other diseases of vocal cords    vocal cords are not  even   Other voice and resonance disorders    Pneumonia 2014, 1992   Sleep apnea    no cpap used   Unspecified hypothyroidism     Tobacco History: Social History   Tobacco Use  Smoking Status Never  Smokeless Tobacco Never   Counseling given: Not Answered   Outpatient Medications Prior to Visit  Medication Sig Dispense Refill    acetaminophen (TYLENOL) 500 MG tablet Take 500 mg by mouth every 6 (six) hours as needed for mild pain, fever or headache.     albuterol (VENTOLIN HFA) 108 (90 Base) MCG/ACT inhaler INHALE 2 PUFFS INTO LUNGS EVERY 6 HOURS AS NEEDED. 18 g 0   amphetamine-dextroamphetamine (ADDERALL) 20 MG tablet Take 20 mg by mouth daily.     baclofen (LIORESAL) 20 MG tablet Take 20 mg by mouth 3 (three) times daily as needed for muscle spasms.     benzonatate (TESSALON) 200 MG capsule Take 1 capsule (200 mg total) by mouth 3 (three) times daily. 20 capsule 0   CALCIUM PO Take 1 tablet by mouth daily.     dalfampridine 10 MG TB12 Take 10 mg by mouth 2 (two) times daily.     dexlansoprazole (DEXILANT) 60 MG capsule TAKE 1 CAPSULE BY MOUTH  ONCE DAILY WITH BREAKFAST 90 capsule 2   famotidine (PEPCID) 20 MG tablet One after supper 30 tablet 11   FLUoxetine (PROZAC) 40 MG capsule Take 40 mg by mouth daily.     furosemide (LASIX) 20 MG tablet TAKE 1 TABLET BY MOUTH  DAILY 90 tablet 3   gabapentin (NEURONTIN) 600 MG tablet Take 1 tablet (600 mg total) by mouth 3 (three) times daily. 90 tablet 2   levothyroxine (SYNTHROID) 100 MCG tablet TAKE 1 TABLET BY MOUTH ONCE DAILY BEFORE BREAKFAST 90 tablet 3   mometasone-formoterol (DULERA) 100-5 MCG/ACT AERO USE 2 PUFFS EVERY 12 HOURS. 3 each 3   Multiple Vitamin (MULTIVITAMIN WITH MINERALS) TABS tablet Take 1 tablet by mouth daily.     MYRBETRIQ 25 MG TB24 tablet Take 25 mg by mouth daily.     ocrelizumab (OCREVUS) 300 MG/10ML injection Inject 300 mg into the vein every 6 (six) months.      pilocarpine (SALAGEN) 7.5 MG tablet Take 7.5 mg by mouth 2 (two) times daily.     Probiotic Product (PROBIOTIC PO) Take 1 capsule by mouth daily. Harmony probiotic     Respiratory Therapy Supplies (FLUTTER) DEVI Use as directed 1 each 0   tamsulosin (FLOMAX) 0.4 MG CAPS capsule Take 0.4 mg by mouth daily.     temazepam (RESTORIL) 15 MG capsule Take 15 mg by mouth at bedtime.      cefTRIAXone (ROCEPHIN) 10 g injection Inject 1 g into the vein daily.     amoxicillin-clavulanate (AUGMENTIN) 875-125 MG tablet Take 1 tablet by mouth 2 (two) times daily.     benzonatate (TESSALON) 200 MG capsule Take 1 capsule (200 mg total) by mouth 3 (three) times daily as needed for cough. 90 capsule 0   sulfamethoxazole-trimethoprim (BACTRIM DS) 800-160 MG tablet Take 1 tablet by mouth 2 (two) times daily.     No facility-administered medications prior to visit.     Review of Systems:   Constitutional:   No  weight loss, night sweats,  Fevers, chills,  +fatigue, or  lassitude.  HEENT:   No headaches,  Difficulty swallowing,  Tooth/dental problems, or  Sore throat,  No sneezing, itching, ear ache, nasal congestion, post nasal drip,   CV:  No chest pain,  Orthopnea, PND, swelling in lower extremities, anasarca, dizziness, palpitations, syncope.   GI  No heartburn, indigestion, abdominal pain, nausea, vomiting, diarrhea, change in bowel habits, loss of appetite, bloody stools.   Resp: .  No chest wall deformity  Skin: no rash or lesions.  GU: no dysuria, change in color of urine, no urgency or frequency.  No flank pain, no hematuria   MS:  No joint pain or swelling.  No decreased range of motion.  No back pain.    Physical Exam  BP 116/60 (BP Location: Right Arm, Patient Position: Sitting, Cuff Size: Normal)   Pulse 89   Temp (!) 97.4 F (36.3 C) (Oral)   Ht _0  (1.6 m)   Wt 114 lb 3.2 oz (51.8 kg)   SpO2 99%   BMI 20.23 kg/m   GEN: A/Ox3; pleasant , NAD, well nourished , walker    HEENT:  [place]/AT,  NOSE-clear, THROAT-clear, no lesions, no postnasal drip or exudate noted.  Upper Airway pseudowheeze is decreased.   NECK:  Supple w/ fair ROM; no JVD; normal carotid impulses w/o bruits; no thyromegaly or nodules palpated; no lymphadenopathy.    RESP  Improved aeration, Clear  w/o, wheezes/ rales/ or rhonchi. no accessory muscle use, no dullness to  percussion  CARD:  RRR, no m/r/g, no peripheral edema, pulses intact, no cyanosis or clubbing.  GI:   Soft & nt; nml bowel sounds; no organomegaly or masses detected.   Musco: Warm bil, no deformities or joint swelling noted.   Neuro: alert, no focal deficits noted.  Heel drop on right with stimulator .   Skin: Warm, no lesions or rashes    Lab Results:  CBC    Component Value Date/Time   WBC 10.3 10/24/2020 0504   RBC 3.41 (L) 10/24/2020 0504   HGB 9.3 (L) 10/24/2020 0504   HCT 29.6 (L) 10/24/2020 0504   PLT 462 (H) 10/24/2020 0504   MCV 86.8 10/24/2020 0504   MCH 27.3 10/24/2020 0504   MCHC 31.4 10/24/2020 0504   RDW 14.3 10/24/2020 0504   LYMPHSABS 1.4 10/23/2020 1328   MONOABS 1.4 (H) 10/23/2020 1328   EOSABS 0.1 10/23/2020 1328   BASOSABS 0.1 10/23/2020 1328    BMET    Component Value Date/Time   NA 133 (L) 10/23/2020 1328   K 3.5 10/23/2020 1328   CL 97 (L) 10/23/2020 1328   CO2 25 10/23/2020 1328   GLUCOSE 87 10/23/2020 1328   BUN 10 10/23/2020 1328   CREATININE 0.58 10/23/2020 1328   CALCIUM 9.0 10/23/2020 1328   GFRNONAA >60 10/23/2020 1328   GFRAA >60 06/08/2017 0704    BNP    Component Value Date/Time   BNP 129.3 (H) 10/23/2020 1231    ProBNP    Component Value Date/Time   PROBNP 40.0 08/08/2015 1622    Imaging: DG SWALLOW FUNC OP MEDICARE SPEECH PATH  Result Date: 10/15/2020 Formatting of this result is different from the original. Objective Swallowing Evaluation: Type of Study: MBS-Modified Barium Swallow Study  Patient Details Name: Kayla [name] MRN: [id number] Date of Birth: [birth year] Today's Date: 10/15/2020 Time: SLP Start Time (ACUTE ONLY): 1140 -SLP Stop Time (ACUTE ONLY): 1210 SLP Time Calculation (min) (ACUTE ONLY): 30 min Past Medical History: Past Medical History: Diagnosis Date  Arthritis   oa  Cervical cancer ([place]) 2005  COPD (chronic obstructive pulmonary disease) ([place])  Multiple sclerosis ([facility])   Osteoporosis   Other diseases  of vocal cords   vocal cords are not even  Other voice and resonance disorders   Pneumonia 2014, 1992  Sleep apnea   no cpap used  Unspecified hypothyroidism  Past Surgical History: Past Surgical History: Procedure Laterality Date  ABDOMINAL HYSTERECTOMY  03/25/2003  for cervical cancer, complete  CATARACT EXTRACTION, BILATERAL Bilateral   lens for cataracts  CONVERSION TO TOTAL HIP Right 09/18/2015  Procedure: CONVERSION OF RIGHT HEMI ARTHROPLASTY TO TOTAL HIP ARTHROPLASTY ACETABULAR REVISON ;  Surgeon: Paralee Cancel, MD;  Location: [facility];  Service: Orthopedics;  Laterality: Right;  HIP ARTHROPLASTY Right 03/01/2013  Procedure: ARTHROPLASTY BIPOLAR HIP;  Surgeon: Mauri Pole, MD;  Location: [facility];  Service: Orthopedics;  Laterality: Right;  HIP CLOSED REDUCTION Right 03/12/2013  Procedure: CLOSED REDUCTION HIP;  Surgeon: Gearlean Alf, MD;  Location: [place] [facility];  Service: Orthopedics;  Laterality: Right;  HIP CLOSED REDUCTION Right 03/19/2013  Procedure: CLOSED REDUCTION HIP;  Surgeon: Johnn Hai, MD;  Location: [facility];  Service: Orthopedics;  Laterality: Right;  HIP CLOSED REDUCTION Right 11/10/2015  Procedure: CLOSED MANIPULATION HIP;  Surgeon: Rod Can, MD;  Location: [facility];  Service: Orthopedics;  Laterality: Right;  HIP CLOSED REDUCTION Right 06/05/2017  Procedure: CLOSED REDUCTION HIP;  Surgeon: [name], MD;  Location: [facility];  Service: Orthopedics;  Laterality: Right;  TOTAL HIP REVISION Right 06/06/2017  Procedure: TOTAL HIP REVISION;  Surgeon: [name], MD;  Location: [facility];  Service: Orthopedics;  Laterality: Right;  VESICOVAGINAL FISTULA CLOSURE W/ TAH  03/25/2003 HPI: 70 yo female with h/o COPD, MS, OA, hypothyroidism, GERD, bilateral vocal fold paralysis (since 2015) referred for OP MBS.  She had recent admission to [facility] (7/7-09/29/20) for pna. She has had ongoing cough since March. She is being followed by Dr. Melvyn Novas at [facility] and Dr. [name], ENT.  GI f/u on 7/14,  with subsequent recommendations for MBS, EGD and colonoscopy.  Pt underwent clinical swallow eval 09/28/20 during admission to [facility] - findings suggested no overt concerns for aspiration but recommended GI f/u for reflux and potential relationship to aspiration pna.  Pt had two prior MBS in December of 2014 after a fall requiring CIR.  At that time she demonstrated residuals in pharynx and decreased UES patency. During pre-assessment today, pt endorsed coughing during and after meals as well as throughout the day, trouble swallowing pills, sensation of food sticking in throat with every meal.  Subjective: alert and good historian Assessment / Plan / Recommendation CHL IP CLINICAL IMPRESSIONS 10/15/2020 Clinical Impression Pt's swallow function, upon reviewing documentation from 2014 MBS studies, does not appear to have changed dramatically.  She continues to present with solid food residue that sits in the valleculae and pyriform sinuses - it is sensed and cleared with a secondary swallow.  There was intermittent incomplete closure of the epiglottis over the vestibule and some reduced patency of the UES - this may be due in part to the degenerative changes in the cervical vertebrae (documented in 9/21 MR spine) and the resultant potential narrowing of the pharyngeal space.  Pt achieved effective laryngeal closure regardless, and there was no aspiration despite large, sequential thin liquid boluses and mixed solid/liquid consistencies. There was a notable CP bar, which played a part in capturing some residue superior to its surface.  Pt should continue to eat regular solids and drink thin liquids; she is already using f/u sub-swallows to clear pharynx. Potentially  pt could have issues with pharyngeal residue spilling into larynx/trachea, but she demonstrated spontaneous compensatory strategies that helped reduce the residue.  Study was reviewed in real time; pt will f/u with GI. SLP Visit Diagnosis Dysphagia, pharyngeal  phase (R13.13) Attention and concentration deficit following -- Frontal lobe and executive function deficit following -- Impact on safety and function --   CHL IP TREATMENT RECOMMENDATION 10/15/2020 Treatment Recommendations No treatment recommended at this time   Prognosis 03/22/2013 Prognosis for Safe Diet Advancement Good Barriers to Reach Goals -- Barriers/Prognosis Comment -- CHL IP DIET RECOMMENDATION 10/15/2020 SLP Diet Recommendations Regular solids;Thin liquid Liquid Administration via Cup;Straw Medication Administration Other (Comment) Compensations -- Postural Changes --   CHL IP OTHER RECOMMENDATIONS 10/15/2020 Recommended Consults -- Oral Care Recommendations Oral care BID Other Recommendations --   CHL IP FOLLOW UP RECOMMENDATIONS 10/15/2020 Follow up Recommendations None   CHL IP FREQUENCY AND DURATION 03/22/2013 Speech Therapy Frequency (ACUTE ONLY) min 5x/week Treatment Duration 2 weeks      CHL IP ORAL PHASE 10/15/2020 Oral Phase WFL Oral - Pudding Teaspoon -- Oral - Pudding Cup -- Oral - Honey Teaspoon -- Oral - Honey Cup -- Oral - Nectar Teaspoon -- Oral - Nectar Cup -- Oral - Nectar Straw -- Oral - Thin Teaspoon -- Oral - Thin Cup -- Oral - Thin Straw -- Oral - Puree -- Oral - Mech Soft -- Oral - Regular -- Oral - Multi-Consistency -- Oral - Pill -- Oral Phase - Comment --  CHL IP PHARYNGEAL PHASE 10/15/2020 Pharyngeal Phase Impaired Pharyngeal- Pudding Teaspoon -- Pharyngeal -- Pharyngeal- Pudding Cup -- Pharyngeal -- Pharyngeal- Honey Teaspoon -- Pharyngeal -- Pharyngeal- Honey Cup -- Pharyngeal -- Pharyngeal- Nectar Teaspoon -- Pharyngeal -- Pharyngeal- Nectar Cup -- Pharyngeal -- Pharyngeal- Nectar Straw -- Pharyngeal -- Pharyngeal- Thin Teaspoon -- Pharyngeal -- Pharyngeal- Thin Cup Reduced pharyngeal peristalsis;Reduced epiglottic inversion;Pharyngeal residue - valleculae;Pharyngeal residue - pyriform Pharyngeal -- Pharyngeal- Thin Straw -- Pharyngeal -- Pharyngeal- Puree Reduced epiglottic  inversion;Pharyngeal residue - valleculae;Pharyngeal residue - pyriform Pharyngeal -- Pharyngeal- Mechanical Soft -- Pharyngeal -- Pharyngeal- Regular Reduced epiglottic inversion;Pharyngeal residue - pyriform;Pharyngeal residue - valleculae Pharyngeal -- Pharyngeal- Multi-consistency -- Pharyngeal -- Pharyngeal- Pill -- Pharyngeal -- Pharyngeal Comment --  No flowsheet data found. Juan Quam Laurice 10/15/2020, 4:03 PM            CLINICAL DATA:  Dysphagia. Cough. Recent aspiration pneumonia, history of MS EXAM: MODIFIED BARIUM SWALLOW TECHNIQUE: Different consistencies of barium were administered orally to the patient by the Speech Pathologist. Imaging of the pharynx was performed in the lateral projection. The radiologist was present in the fluoroscopy room for this study, providing personal supervision. FLUOROSCOPY TIME:  Fluoroscopy Time:  1 minutes 22 seconds Radiation Exposure Index (if provided by the fluoroscopic device): 13.86 COMPARISON:  None. FINDINGS: No aspiration with any attempted consistencies including thin barium, puree, and solid. Pharyngeal retention is present with solid consistency. Prominent cricopharyngeus. IMPRESSION: No aspiration. Please refer to the Speech Pathologists report for complete details and recommendations. Electronically Signed   By: Macy Mis M.D.   On: 10/15/2020 12:12   CT Chest High Resolution  Result Date: 10/25/2020 CLINICAL DATA:  70 year old female with history of chronic cough. History of aspiration. EXAM: CT CHEST WITHOUT CONTRAST TECHNIQUE: Multidetector CT imaging of the chest was performed following the standard protocol without intravenous contrast. High resolution imaging of the lungs, as well as inspiratory and expiratory imaging, was performed. COMPARISON:  Chest CT 09/27/2020. FINDINGS: Cardiovascular: Heart size  is normal. There is no significant pericardial fluid, thickening or pericardial calcification. Aortic atherosclerosis. No definite  coronary artery calcifications. Mediastinum/Nodes: No pathologically enlarged mediastinal or hilar lymph nodes. Please note that accurate exclusion of hilar adenopathy is limited on noncontrast CT scans. Esophagus is unremarkable in appearance. No axillary lymphadenopathy. Lungs/Pleura: When compared to the prior examination, several of the areas of patchy peribronchovascular ground-glass attenuation and nodular airspace consolidation seen on the prior study have resolved or regressed (particularly in the basal segments of the right lower lobe). However, other areas have dramatically progressed or are new. The most significant progression is in the left lower lobe where there is far more frank consolidation than seen on the prior study, along with increasing areas of ground-glass attenuation. Additionally, in the left upper lobe there are completely new areas of ground-glass attenuation and peribronchovascular airspace consolidation. Scattered areas of cylindrical bronchiectasis are again noted. Findings are randomly distributed in the lungs bilaterally and are asymmetric involving the left lung greater than the right. Inspiratory and expiratory imaging demonstrates some mild air trapping indicative of mild small airways disease. No pleural effusions. Upper Abdomen: 1.1 cm low-attenuation lesion between segments 8 and 5, incompletely characterized on today's non-contrast CT examination, but statistically likely to represent a cyst. Musculoskeletal: There are no aggressive appearing lytic or blastic lesions noted in the visualized portions of the skeleton. Old healed fracture of the upper sternum. IMPRESSION: 1. Waxing and waning patterns of ground-glass attenuation and consolidation in the lungs bilaterally, as above, with an unusual appearance which is favored to represent an incompletely treated atypical infection. 2. Aortic atherosclerosis. Aortic Atherosclerosis (ICD10-I70.0). Electronically Signed   By: Vinnie Langton M.D.   On: 10/25/2020 05:35   DG CHEST PORT 1 VIEW  Result Date: 10/26/2020 CLINICAL DATA:  Status post bronchoscopy EXAM: PORTABLE CHEST 1 VIEW COMPARISON:  Chest CT dated October 23, 2020 FINDINGS: Cardiac and mediastinal contours are unchanged. Heterogeneous opacities at the left lung, better evaluated on recent CT, a component of these findings may be related to post biopsy change. No pleural effusion or pneumothorax. Linear opacity of the right lower lung, likely atelectasis. IMPRESSION: No evidence of pleural effusion or pneumothorax. Electronically Signed   By: Yetta Glassman MD   On: 10/26/2020 10:21   DG Chest Portable 1 View  Result Date: 10/23/2020 CLINICAL DATA:  Chest pain with cough and shortness of breath. EXAM: PORTABLE CHEST 1 VIEW COMPARISON:  Radiographs 09/27/2020 and 07/18/2020.  CT 09/27/2020. FINDINGS: 1234 hours. The heart size and mediastinal contours are stable. There are progressive airspace opacities at the left lung base and left perihilar region. Minimal patchy opacity at the right lung base appears improved. There is no significant pleural effusion or pneumothorax. Old fracture the proximal right humerus appears unchanged. IMPRESSION: Progressive multifocal left lung airspace opacities consistent with progressive multilobar pneumonia. Right basilar involvement appears mildly improved in the interval. Electronically Signed   By: Richardean Sale M.D.   On: 10/23/2020 13:15   DG C-ARM BRONCHOSCOPY  Result Date: 10/26/2020 C-ARM BRONCHOSCOPY: Fluoroscopy was utilized by the requesting physician.  No radiographic interpretation.      No flowsheet data found.  No results found for: NITRICOXIDE      Assessment & Plan:   Cough variant asthma vs vcd  related to MS  Slow resolving flare with recent pneumonia - continue on current regimen  Continue with trigger prevention and cough control   Plan  Patient Instructions  Continue follow up with Immunology  IV  Rocephin as directed.  Robitussin DM 2 tsp every 4hrs for cough As needed   Tessalon Three times a day  for cough as needed  Flutter valve Three times a day   Zyrtec 72m At bedtime  As needed  drainage.  Continue on Dulera 2 puffs Twice daily, rinse after use.  Chest xray today .  Follow up in 3 with Dr. WMelvyn Novas or Quincey Nored NP and As needed   Please contact office for sooner follow up if symptoms do not improve or worsen or seek emergency care        Multifocal pneumonia Clinically improving -on prolonged IV abx Rocephin  BAL + Streptococcous Constellatus (senstivie to Ceftriaxone )  Immunosuppressed on Ocrevus - cont fu with Immunology and Neuro Chest xray today , will need CT chest in ~6 weeks   Plan Patient Instructions  Continue follow up with Immunology  IV Rocephin as directed.  Robitussin DM 2 tsp every 4hrs for cough As needed   Tessalon Three times a day  for cough as needed  Flutter valve Three times a day   Zyrtec 117mAt bedtime  As needed  drainage.  Continue on Dulera 2 puffs Twice daily, rinse after use.  Chest xray today .  Follow up in 3 with Dr. WeMelvyn Novasor Patra Gherardi NP and As needed   Please contact office for sooner follow up if symptoms do not improve or worsen or seek emergency care          TaRexene EdisonNP 11/08/2020

## 2020-11-08 NOTE — Assessment & Plan Note (Signed)
Clinically improving -on prolonged IV abx Rocephin  BAL + Streptococcous Constellatus (senstivie to Ceftriaxone )  Immunosuppressed on Ocrevus - cont fu with Immunology and Neuro Chest xray today , will need CT chest in ~6 weeks   Plan Patient Instructions  Continue follow up with Immunology  IV Rocephin as directed.  Robitussin DM 2 tsp every 4hrs for cough As needed   Tessalon Three times a day  for cough as needed  Flutter valve Three times a day   Zyrtec 68m At bedtime  As needed  drainage.  Continue on Dulera 2 puffs Twice daily, rinse after use.  Chest xray today .  Follow up in 3 with Dr. WMelvyn Novas or Joselinne Lawal NP and As needed   Please contact office for sooner follow up if symptoms do not improve or worsen or seek emergency care

## 2020-11-08 NOTE — Patient Instructions (Addendum)
Continue follow up with Immunology  IV Rocephin as directed.  Robitussin DM 2 tsp every 4hrs for cough As needed   Tessalon Three times a day  for cough as needed  Flutter valve Three times a day   Zyrtec '10mg'$  At bedtime  As needed  drainage.  Continue on Dulera 2 puffs Twice daily, rinse after use.  Chest xray today .  Follow up in 3 with Dr. Melvyn Novas  or Shakeitha Umbaugh NP and As needed   Please contact office for sooner follow up if symptoms do not improve or worsen or seek emergency care

## 2020-11-08 NOTE — Assessment & Plan Note (Signed)
Slow resolving flare with recent pneumonia - continue on current regimen  Continue with trigger prevention and cough control   Plan  Patient Instructions  Continue follow up with Immunology  IV Rocephin as directed.  Robitussin DM 2 tsp every 4hrs for cough As needed   Tessalon Three times a day  for cough as needed  Flutter valve Three times a day   Zyrtec '10mg'$  At bedtime  As needed  drainage.  Continue on Dulera 2 puffs Twice daily, rinse after use.  Chest xray today .  Follow up in 3 with Dr. Melvyn Novas  or Keirstyn Aydt NP and As needed   Please contact office for sooner follow up if symptoms do not improve or worsen or seek emergency care

## 2020-11-12 ENCOUNTER — Telehealth: Payer: Self-pay | Admitting: Adult Health

## 2020-11-12 DIAGNOSIS — Z792 Long term (current) use of antibiotics: Secondary | ICD-10-CM | POA: Diagnosis not present

## 2020-11-12 DIAGNOSIS — J440 Chronic obstructive pulmonary disease with acute lower respiratory infection: Secondary | ICD-10-CM | POA: Diagnosis not present

## 2020-11-12 DIAGNOSIS — N319 Neuromuscular dysfunction of bladder, unspecified: Secondary | ICD-10-CM | POA: Diagnosis not present

## 2020-11-12 DIAGNOSIS — Z466 Encounter for fitting and adjustment of urinary device: Secondary | ICD-10-CM | POA: Diagnosis not present

## 2020-11-12 DIAGNOSIS — Z452 Encounter for adjustment and management of vascular access device: Secondary | ICD-10-CM | POA: Diagnosis not present

## 2020-11-12 DIAGNOSIS — J188 Other pneumonia, unspecified organism: Secondary | ICD-10-CM | POA: Diagnosis not present

## 2020-11-12 NOTE — Progress Notes (Signed)
ATC x1, LMTCB. 

## 2020-11-13 NOTE — Telephone Encounter (Signed)
Pt returned call. Stated to pt the results of the recent cxr and she verbalized understanding. Nothing further needed.

## 2020-11-13 NOTE — Telephone Encounter (Signed)
ATC patient (returning her call) regarding CXR results:  Xray is much improved , decreased left opacities /pneumonia .  Continue with office visit recs and follow up  Please contact office for sooner follow up if symptoms do not improve or worsen or seek emerge  LM to return call.  When she return call, please provide results.

## 2020-11-15 ENCOUNTER — Encounter: Payer: Medicare Other | Admitting: Gastroenterology

## 2020-11-19 DIAGNOSIS — Z466 Encounter for fitting and adjustment of urinary device: Secondary | ICD-10-CM | POA: Diagnosis not present

## 2020-11-19 DIAGNOSIS — J440 Chronic obstructive pulmonary disease with acute lower respiratory infection: Secondary | ICD-10-CM | POA: Diagnosis not present

## 2020-11-19 DIAGNOSIS — N319 Neuromuscular dysfunction of bladder, unspecified: Secondary | ICD-10-CM | POA: Diagnosis not present

## 2020-11-19 DIAGNOSIS — Z792 Long term (current) use of antibiotics: Secondary | ICD-10-CM | POA: Diagnosis not present

## 2020-11-19 DIAGNOSIS — J188 Other pneumonia, unspecified organism: Secondary | ICD-10-CM | POA: Diagnosis not present

## 2020-11-19 DIAGNOSIS — Z452 Encounter for adjustment and management of vascular access device: Secondary | ICD-10-CM | POA: Diagnosis not present

## 2020-11-20 ENCOUNTER — Other Ambulatory Visit: Payer: Self-pay | Admitting: Internal Medicine

## 2020-11-20 DIAGNOSIS — D801 Nonfamilial hypogammaglobulinemia: Secondary | ICD-10-CM | POA: Diagnosis not present

## 2020-11-27 ENCOUNTER — Encounter: Payer: Medicare Other | Admitting: Gastroenterology

## 2020-11-27 ENCOUNTER — Telehealth: Payer: Self-pay | Admitting: Adult Health

## 2020-11-27 DIAGNOSIS — J189 Pneumonia, unspecified organism: Secondary | ICD-10-CM | POA: Diagnosis not present

## 2020-11-27 NOTE — Telephone Encounter (Signed)
Called pt and there was no answer-LMTCB °

## 2020-11-27 NOTE — Telephone Encounter (Signed)
Called and spoke with pt letting her know that TP said it was okay for her to have a cxr prior to her appt. When stated that to pt, pt said that she was already on her way to [facility] to have the chest xray performed. She said since she never heard from [place] prior to now is why she decided to go ahead and leave to get the cxr performed. Nothing further needed.

## 2020-11-27 NOTE — Telephone Encounter (Signed)
Called and spoke with patient. She stated that she would like to have a CXR done before her appt with TP on Thursday to follow up on her PNA. She stated that she is not currently having any symptoms but she just wants to be sure that the PNA is gone.   She also mentioned that Dr.Litchenberger with [facility] in [place] has asked for a repeat CXR but she does not want to drive to [place] just for a CXR.   TP, please advise if you are ok with ordering a CXR for her. Thanks!

## 2020-11-27 NOTE — Telephone Encounter (Signed)
That is fine to put in chest x-ray prior to my visit

## 2020-11-27 NOTE — Telephone Encounter (Signed)
Patient is returning phone call. Patient phone number is [phone].

## 2020-11-29 ENCOUNTER — Other Ambulatory Visit: Payer: Self-pay

## 2020-11-29 ENCOUNTER — Encounter: Payer: Self-pay | Admitting: Adult Health

## 2020-11-29 ENCOUNTER — Ambulatory Visit (INDEPENDENT_AMBULATORY_CARE_PROVIDER_SITE_OTHER): Payer: Medicare Other | Admitting: Adult Health

## 2020-11-29 ENCOUNTER — Other Ambulatory Visit: Payer: Self-pay | Admitting: Internal Medicine

## 2020-11-29 DIAGNOSIS — J188 Other pneumonia, unspecified organism: Secondary | ICD-10-CM | POA: Diagnosis not present

## 2020-11-29 DIAGNOSIS — Z466 Encounter for fitting and adjustment of urinary device: Secondary | ICD-10-CM | POA: Diagnosis not present

## 2020-11-29 DIAGNOSIS — J45909 Unspecified asthma, uncomplicated: Secondary | ICD-10-CM

## 2020-11-29 DIAGNOSIS — J189 Pneumonia, unspecified organism: Secondary | ICD-10-CM | POA: Diagnosis not present

## 2020-11-29 DIAGNOSIS — I75023 Atheroembolism of bilateral lower extremities: Secondary | ICD-10-CM

## 2020-11-29 DIAGNOSIS — N319 Neuromuscular dysfunction of bladder, unspecified: Secondary | ICD-10-CM | POA: Diagnosis not present

## 2020-11-29 DIAGNOSIS — Z452 Encounter for adjustment and management of vascular access device: Secondary | ICD-10-CM | POA: Diagnosis not present

## 2020-11-29 DIAGNOSIS — J440 Chronic obstructive pulmonary disease with acute lower respiratory infection: Secondary | ICD-10-CM | POA: Diagnosis not present

## 2020-11-29 DIAGNOSIS — Z792 Long term (current) use of antibiotics: Secondary | ICD-10-CM | POA: Diagnosis not present

## 2020-11-29 NOTE — Assessment & Plan Note (Addendum)
Multifocal pneumonia-patient is clinically improved after a prolonged course of IV antibiotics.   Request records from Immunology  Clinically she has improved and chest x-ray shows good radiographical improvement. Will need to follow chest x-ray for clearance.  Patient has recently had a chest x-ray we will await those results being sent to our office.  Plan  Patient Instructions  Continue follow up with Immunology  Robitussin DM 2 tsp every 4hrs for cough As needed   Tessalon Three times a day  for cough as needed  Flutter valve Three times a day   Zyrtec '10mg'$  At bedtime  As needed  drainage.  Continue on Dulera 2 puffs Twice daily, rinse after use.  Follow up in 2-3 months with Dr. Melvyn Novas  and As needed   Please contact office for sooner follow up if symptoms do not improve or worsen or seek emergency care

## 2020-11-29 NOTE — Progress Notes (Signed)
_0  ID: Kayla [name], female    DOB: June 29, 1950, 70 y.o.   MRN: [id number]  Chief Complaint  Patient presents with   Follow-up    Referring provider: Janith Lima, MD  HPI: 70 year old female never smoker followed for asthma Medical history significant for multiple sclerosis, bilateral vocal cord paralysis  TEST/EVENTS :  CT chest September 27, 2020 showed groundglass opacities in the right lower lobe and left lower lobe concerning for multifocal pneumonia.  14 x 10 mm nodular density in the right middle lobe Modified barium swallow was negative CT chest on August 3 showed groundglass attenuation consolidation with waxing and waning groundglass attenuation.  Autoimmune work-up was negative.  Fungal cultures were negative.  AFB sputum was negative.  BAL cultures showed rare Streptococcus Constellatus.  (Sensitive to ceftriaxone, levofloxacin and vancomycin.   intermediate to penicillin).  Sed rate was elevated at 80. Cytology showed reactive bronchial cells.  Benign lung tissue.  11/29/2020 Follow up : Asthma and Pneumonia  Patient returns for a 1 month follow-up.  She has been having difficulty over the last 6 months.  She was admitted in early July for community-acquired pneumonia.  She was treated with IV antibiotics.  She was felt to be high risk for aspiration.  CT chest July 7 showed groundglass opacities in the right lower lobe and left lower lobe concerning for multifocal pneumonia.  An 14 mm nodular density in the right middle lobe.  Patient was discharged home but continued to have ongoing symptoms and was readmitted in early August 2022.  CT chest showed groundglass attenuation and waxing waning groundglass.  Autoimmune work-up was negative.  Fungal cultures AFB sputum was negative.  BAL cultures showed rare Streptococcus  Constellatus, sed rate was 80.  Cytology showed reactive bronchial cells and benign lung tissue.  Patient was referred to immunology.  She was started on IV  antibiotics with Rocephin for 21 days. Finished 1 week ago . Is starting IVIG . Ocrevus has been stopped. Will seen Neuro in October to decide on new treatment plan for MS   Chest x-ray last visit shows significantly decreased left upper and lower lobe opacities consistent with an improving pneumonia.  Since last visit patient is feeling better. Decreased cough and congestion . Energy level some improved but still tired . Gets worn out easily. Appetite is good, no n/v/d.  Had chest xray done earlier this week. Results are pending .    Allergies  Allergen Reactions   Tramadol     hallucinations    Contrast Media [Iodinated Diagnostic Agents] Rash   Iohexol Hives   Morphine And Related Other (See Comments)    Reaction:  Hallucinations    Methylprednisolone Other (See Comments)    Reaction:  Decreases pts heart rate    Oxycodone-Acetaminophen Rash and Other (See Comments)    Reaction:  Hallucinations    Phenytoin Sodium Extended Rash   Zanaflex [Tizanidine Hydrochloride] Rash    Immunization History  Administered Date(s) Administered   DTP 04/10/2009   Fluad Quad(high Dose 65+) 12/29/2018   Influenza Split 12/10/2010, 02/11/2012, 12/22/2012, 01/02/2014, 12/22/2014   Influenza Whole 02/19/2005, 03/28/2009, 01/23/2016   Influenza, High Dose Seasonal PF 02/23/2018   Influenza,inj,Quad PF,6+ Mos 02/10/2017   Influenza-Unspecified 12/02/2018, 12/31/2018, 01/08/2019, 12/22/2019   Moderna Sars-Covid-2 Vaccination 04/29/2019, 06/01/2019   Pneumococcal Conjugate-13 11/06/2015   Pneumococcal Polysaccharide-23 03/28/2009, 04/26/2019   Tdap 04/26/2019   Zoster Recombinat (Shingrix) 09/07/2019, 02/17/2020    Past Medical History:  Diagnosis Date  Arthritis    oa   Cervical cancer ([place]) 2005   COPD (chronic obstructive pulmonary disease) ([place])    Multiple sclerosis ([place])    Osteoporosis    Other diseases of vocal cords    vocal cords are not even   Other voice and resonance disorders     Pneumonia 2014, 1992   Sleep apnea    no cpap used   Unspecified hypothyroidism     Tobacco History: Social History   Tobacco Use  Smoking Status Never  Smokeless Tobacco Never   Counseling given: Not Answered   Outpatient Medications Prior to Visit  Medication Sig Dispense Refill   acetaminophen (TYLENOL) 500 MG tablet Take 500 mg by mouth every 6 (six) hours as needed for mild pain, fever or headache.     albuterol (VENTOLIN HFA) 108 (90 Base) MCG/ACT inhaler INHALE 2 PUFFS INTO LUNGS EVERY 6 HOURS AS NEEDED. 18 g 0   amphetamine-dextroamphetamine (ADDERALL) 20 MG tablet Take 20 mg by mouth daily.     baclofen (LIORESAL) 20 MG tablet Take 20 mg by mouth 3 (three) times daily as needed for muscle spasms.     benzonatate (TESSALON) 200 MG capsule Take 1 capsule (200 mg total) by mouth 3 (three) times daily. 20 capsule 0   CALCIUM PO Take 1 tablet by mouth daily.     cefTRIAXone (ROCEPHIN) 10 g injection Inject 1 g into the vein daily.     dalfampridine 10 MG TB12 Take 10 mg by mouth 2 (two) times daily.     dexlansoprazole (DEXILANT) 60 MG capsule TAKE 1 CAPSULE BY MOUTH  ONCE DAILY WITH BREAKFAST 90 capsule 2   famotidine (PEPCID) 20 MG tablet One after supper 30 tablet 11   FLUoxetine (PROZAC) 40 MG capsule Take 40 mg by mouth daily.     furosemide (LASIX) 20 MG tablet TAKE 1 TABLET BY MOUTH  DAILY 90 tablet 3   gabapentin (NEURONTIN) 600 MG tablet Take 1 tablet (600 mg total) by mouth 3 (three) times daily. 90 tablet 2   levothyroxine (SYNTHROID) 100 MCG tablet TAKE 1 TABLET BY MOUTH ONCE DAILY BEFORE BREAKFAST 90 tablet 3   mometasone-formoterol (DULERA) 100-5 MCG/ACT AERO USE 2 PUFFS EVERY 12 HOURS. 3 each 3   Multiple Vitamin (MULTIVITAMIN WITH MINERALS) TABS tablet Take 1 tablet by mouth daily.     MYRBETRIQ 25 MG TB24 tablet Take 25 mg by mouth daily.     ocrelizumab (OCREVUS) 300 MG/10ML injection Inject 300 mg into the vein every 6 (six) months.      pilocarpine  (SALAGEN) 7.5 MG tablet Take 7.5 mg by mouth 2 (two) times daily.     Probiotic Product (PROBIOTIC PO) Take 1 capsule by mouth daily. Harmony probiotic     Respiratory Therapy Supplies (FLUTTER) DEVI Use as directed 1 each 0   tamsulosin (FLOMAX) 0.4 MG CAPS capsule Take 0.4 mg by mouth daily.     temazepam (RESTORIL) 15 MG capsule Take 15 mg by mouth at bedtime.     No facility-administered medications prior to visit.     Review of Systems:   Constitutional:   No  weight loss, night sweats,  Fevers, chills,  +fatigue, or  lassitude.  HEENT:   No headaches,  Difficulty swallowing,  Tooth/dental problems, or  Sore throat,                No sneezing, itching, ear ache, nasal congestion, post nasal drip,   CV:  No chest  pain,  Orthopnea, PND, swelling in lower extremities, anasarca, dizziness, palpitations, syncope.   GI  No heartburn, indigestion, abdominal pain, nausea, vomiting, diarrhea, change in bowel habits, loss of appetite, bloody stools.   Resp:   No excess mucus, no productive cough,  No non-productive cough,  No coughing up of blood.  No change in color of mucus.  No wheezing.  No chest wall deformity  Skin: no rash or lesions.  GU: no dysuria, change in color of urine, no urgency or frequency.  No flank pain, no hematuria   MS:  Gen weakness, MS     Physical Exam  BP 118/62 (BP Location: Right Arm, Patient Position: Sitting, Cuff Size: Normal)   Pulse 88   Temp 98.4 F (36.9 C) (Oral)   Ht _0  (1.6 m)   Wt 114 lb 12.8 oz (52.1 kg)   SpO2 98%   BMI 20.34 kg/m   GEN: A/Ox3; pleasant , NAD, well nourished , rolling walker    HEENT:  [place]/AT,  EACs-clear, TMs-wnl, NOSE-clear, THROAT-clear, no lesions, no postnasal drip or exudate noted.   NECK:  Supple w/ fair ROM; no JVD; normal carotid impulses w/o bruits; no thyromegaly or nodules palpated; no lymphadenopathy.    RESP  Clear  P & A; w/o, wheezes/ rales/ or rhonchi. no accessory muscle use, no dullness to  percussion  CARD:  RRR, no m/r/g, no peripheral edema, pulses intact, no cyanosis or clubbing. Left arm IV   GI:   Soft & nt; nml bowel sounds; no organomegaly or masses detected.   Musco: Warm bil, no deformities or joint swelling noted.   Neuro: alert, no focal deficits noted.  Walker , External RLE nerve stimulator   Skin: Warm, no lesions or rashes    Lab Results:  CBC    Component Value Date/Time   WBC 10.3 10/24/2020 0504   RBC 3.41 (L) 10/24/2020 0504   HGB 9.3 (L) 10/24/2020 0504   HCT 29.6 (L) 10/24/2020 0504   PLT 462 (H) 10/24/2020 0504   MCV 86.8 10/24/2020 0504   MCH 27.3 10/24/2020 0504   MCHC 31.4 10/24/2020 0504   RDW 14.3 10/24/2020 0504   LYMPHSABS 1.4 10/23/2020 1328   MONOABS 1.4 (H) 10/23/2020 1328   EOSABS 0.1 10/23/2020 1328   BASOSABS 0.1 10/23/2020 1328    BMET    Component Value Date/Time   NA 133 (L) 10/23/2020 1328   K 3.5 10/23/2020 1328   CL 97 (L) 10/23/2020 1328   CO2 25 10/23/2020 1328   GLUCOSE 87 10/23/2020 1328   BUN 10 10/23/2020 1328   CREATININE 0.58 10/23/2020 1328   CALCIUM 9.0 10/23/2020 1328   GFRNONAA >60 10/23/2020 1328   GFRAA >60 06/08/2017 0704    BNP    Component Value Date/Time   BNP 129.3 (H) 10/23/2020 1231    ProBNP    Component Value Date/Time   PROBNP 40.0 08/08/2015 1622    Imaging: DG Chest 2 View  Result Date: 11/09/2020 CLINICAL DATA:  Pneumonia. EXAM: CHEST - 2 VIEW COMPARISON:  October 26, 2020. FINDINGS: The heart size and mediastinal contours are within normal limits. Right lung is clear. Significantly decreased left upper and lower lobe airspace opacities are noted consistent with improving pneumonia. The visualized skeletal structures are unremarkable. IMPRESSION: Significantly decreased left upper and lower lobe opacities are noted consistent with improving pneumonia, although continued radiographic follow-up is recommended to ensure complete resolution. Electronically Signed   By: Marijo Conception  M.D.   On: 11/09/2020 08:43      No flowsheet data found.  No results found for: NITRICOXIDE      Assessment & Plan:   Multifocal pneumonia Multifocal pneumonia-patient is clinically improved after a prolonged course of IV antibiotics.   Request records from Immunology  Clinically she has improved and chest x-ray shows good radiographical improvement. Will need to follow chest x-ray for clearance.  Patient has recently had a chest x-ray we will await those results being sent to our office.  Plan  Patient Instructions  Continue follow up with Immunology  Robitussin DM 2 tsp every 4hrs for cough As needed   Tessalon Three times a day  for cough as needed  Flutter valve Three times a day   Zyrtec 76m At bedtime  As needed  drainage.  Continue on Dulera 2 puffs Twice daily, rinse after use.  Follow up in 2-3 months with Dr. WMelvyn Novas and As needed   Please contact office for sooner follow up if symptoms do not improve or worsen or seek emergency care        Intrinsic asthma Controlled on current regimen   Plan  Patient Instructions  Continue follow up with Immunology  Robitussin DM 2 tsp every 4hrs for cough As needed   Tessalon Three times a day  for cough as needed  Flutter valve Three times a day   Zyrtec 155mAt bedtime  As needed  drainage.  Continue on Dulera 2 puffs Twice daily, rinse after use.  Follow up in 2-3 months with Dr. WeMelvyn Novasand As needed   Please contact office for sooner follow up if symptoms do not improve or worsen or seek emergency care          TaRexene EdisonNP 11/29/2020

## 2020-11-29 NOTE — Assessment & Plan Note (Signed)
Controlled on current regimen   Plan  Patient Instructions  Continue follow up with Immunology  Robitussin DM 2 tsp every 4hrs for cough As needed   Tessalon Three times a day  for cough as needed  Flutter valve Three times a day   Zyrtec '10mg'$  At bedtime  As needed  drainage.  Continue on Dulera 2 puffs Twice daily, rinse after use.  Follow up in 2-3 months with Dr. Melvyn Novas  and As needed   Please contact office for sooner follow up if symptoms do not improve or worsen or seek emergency care

## 2020-11-29 NOTE — Patient Instructions (Signed)
Continue follow up with Immunology  Robitussin DM 2 tsp every 4hrs for cough As needed   Tessalon Three times a day  for cough as needed  Flutter valve Three times a day   Zyrtec '10mg'$  At bedtime  As needed  drainage.  Continue on Dulera 2 puffs Twice daily, rinse after use.  Follow up in 2-3 months with Dr. Melvyn Novas  and As needed   Please contact office for sooner follow up if symptoms do not improve or worsen or seek emergency care

## 2020-11-30 LAB — FUNGAL ORGANISM REFLEX

## 2020-11-30 LAB — FUNGUS CULTURE RESULT

## 2020-11-30 LAB — FUNGUS CULTURE WITH STAIN

## 2020-12-04 DIAGNOSIS — N319 Neuromuscular dysfunction of bladder, unspecified: Secondary | ICD-10-CM | POA: Diagnosis not present

## 2020-12-04 DIAGNOSIS — Z466 Encounter for fitting and adjustment of urinary device: Secondary | ICD-10-CM | POA: Diagnosis not present

## 2020-12-04 DIAGNOSIS — J188 Other pneumonia, unspecified organism: Secondary | ICD-10-CM | POA: Diagnosis not present

## 2020-12-04 DIAGNOSIS — J440 Chronic obstructive pulmonary disease with acute lower respiratory infection: Secondary | ICD-10-CM | POA: Diagnosis not present

## 2020-12-04 DIAGNOSIS — Z452 Encounter for adjustment and management of vascular access device: Secondary | ICD-10-CM | POA: Diagnosis not present

## 2020-12-04 DIAGNOSIS — Z792 Long term (current) use of antibiotics: Secondary | ICD-10-CM | POA: Diagnosis not present

## 2020-12-05 DIAGNOSIS — Z466 Encounter for fitting and adjustment of urinary device: Secondary | ICD-10-CM | POA: Diagnosis not present

## 2020-12-05 DIAGNOSIS — F32A Depression, unspecified: Secondary | ICD-10-CM | POA: Diagnosis not present

## 2020-12-05 DIAGNOSIS — N319 Neuromuscular dysfunction of bladder, unspecified: Secondary | ICD-10-CM | POA: Diagnosis not present

## 2020-12-05 DIAGNOSIS — Z792 Long term (current) use of antibiotics: Secondary | ICD-10-CM | POA: Diagnosis not present

## 2020-12-05 DIAGNOSIS — K219 Gastro-esophageal reflux disease without esophagitis: Secondary | ICD-10-CM | POA: Diagnosis not present

## 2020-12-05 DIAGNOSIS — J440 Chronic obstructive pulmonary disease with acute lower respiratory infection: Secondary | ICD-10-CM | POA: Diagnosis not present

## 2020-12-05 DIAGNOSIS — Z452 Encounter for adjustment and management of vascular access device: Secondary | ICD-10-CM | POA: Diagnosis not present

## 2020-12-05 DIAGNOSIS — D511 Vitamin B12 deficiency anemia due to selective vitamin B12 malabsorption with proteinuria: Secondary | ICD-10-CM | POA: Diagnosis not present

## 2020-12-05 DIAGNOSIS — K117 Disturbances of salivary secretion: Secondary | ICD-10-CM | POA: Diagnosis not present

## 2020-12-05 DIAGNOSIS — J188 Other pneumonia, unspecified organism: Secondary | ICD-10-CM | POA: Diagnosis not present

## 2020-12-05 DIAGNOSIS — F988 Other specified behavioral and emotional disorders with onset usually occurring in childhood and adolescence: Secondary | ICD-10-CM | POA: Diagnosis not present

## 2020-12-05 DIAGNOSIS — G35 Multiple sclerosis: Secondary | ICD-10-CM | POA: Diagnosis not present

## 2020-12-05 DIAGNOSIS — D8989 Other specified disorders involving the immune mechanism, not elsewhere classified: Secondary | ICD-10-CM | POA: Diagnosis not present

## 2020-12-05 DIAGNOSIS — G4700 Insomnia, unspecified: Secondary | ICD-10-CM | POA: Diagnosis not present

## 2020-12-05 DIAGNOSIS — E039 Hypothyroidism, unspecified: Secondary | ICD-10-CM | POA: Diagnosis not present

## 2020-12-07 DIAGNOSIS — Z466 Encounter for fitting and adjustment of urinary device: Secondary | ICD-10-CM | POA: Diagnosis not present

## 2020-12-07 DIAGNOSIS — Z452 Encounter for adjustment and management of vascular access device: Secondary | ICD-10-CM | POA: Diagnosis not present

## 2020-12-07 DIAGNOSIS — Z792 Long term (current) use of antibiotics: Secondary | ICD-10-CM | POA: Diagnosis not present

## 2020-12-07 DIAGNOSIS — J188 Other pneumonia, unspecified organism: Secondary | ICD-10-CM | POA: Diagnosis not present

## 2020-12-07 DIAGNOSIS — N319 Neuromuscular dysfunction of bladder, unspecified: Secondary | ICD-10-CM | POA: Diagnosis not present

## 2020-12-07 DIAGNOSIS — J440 Chronic obstructive pulmonary disease with acute lower respiratory infection: Secondary | ICD-10-CM | POA: Diagnosis not present

## 2020-12-11 LAB — ACID FAST CULTURE WITH REFLEXED SENSITIVITIES (MYCOBACTERIA)
Acid Fast Culture: NEGATIVE
Acid Fast Culture: NEGATIVE
Acid Fast Culture: NEGATIVE

## 2020-12-13 DIAGNOSIS — G35 Multiple sclerosis: Secondary | ICD-10-CM | POA: Diagnosis not present

## 2020-12-13 DIAGNOSIS — D849 Immunodeficiency, unspecified: Secondary | ICD-10-CM | POA: Diagnosis not present

## 2020-12-13 DIAGNOSIS — F32A Depression, unspecified: Secondary | ICD-10-CM | POA: Diagnosis not present

## 2020-12-13 DIAGNOSIS — Z79890 Hormone replacement therapy (postmenopausal): Secondary | ICD-10-CM | POA: Diagnosis not present

## 2020-12-13 DIAGNOSIS — Z79899 Other long term (current) drug therapy: Secondary | ICD-10-CM | POA: Diagnosis not present

## 2020-12-13 DIAGNOSIS — J440 Chronic obstructive pulmonary disease with acute lower respiratory infection: Secondary | ICD-10-CM | POA: Diagnosis not present

## 2020-12-13 DIAGNOSIS — F988 Other specified behavioral and emotional disorders with onset usually occurring in childhood and adolescence: Secondary | ICD-10-CM | POA: Diagnosis not present

## 2020-12-13 DIAGNOSIS — J189 Pneumonia, unspecified organism: Secondary | ICD-10-CM | POA: Diagnosis not present

## 2020-12-13 DIAGNOSIS — N319 Neuromuscular dysfunction of bladder, unspecified: Secondary | ICD-10-CM | POA: Diagnosis not present

## 2020-12-19 ENCOUNTER — Other Ambulatory Visit: Payer: Self-pay

## 2020-12-19 ENCOUNTER — Ambulatory Visit (INDEPENDENT_AMBULATORY_CARE_PROVIDER_SITE_OTHER): Payer: Medicare Other | Admitting: Internal Medicine

## 2020-12-19 ENCOUNTER — Encounter: Payer: Self-pay | Admitting: Internal Medicine

## 2020-12-19 VITALS — BP 110/72 | HR 64 | Temp 98.0°F | Resp 20 | Ht 63.0 in | Wt 118.0 lb

## 2020-12-19 DIAGNOSIS — E038 Other specified hypothyroidism: Secondary | ICD-10-CM | POA: Diagnosis not present

## 2020-12-19 DIAGNOSIS — D801 Nonfamilial hypogammaglobulinemia: Secondary | ICD-10-CM | POA: Diagnosis not present

## 2020-12-19 DIAGNOSIS — E871 Hypo-osmolality and hyponatremia: Secondary | ICD-10-CM | POA: Diagnosis not present

## 2020-12-19 DIAGNOSIS — D539 Nutritional anemia, unspecified: Secondary | ICD-10-CM

## 2020-12-19 DIAGNOSIS — D508 Other iron deficiency anemias: Secondary | ICD-10-CM | POA: Diagnosis not present

## 2020-12-19 DIAGNOSIS — I75023 Atheroembolism of bilateral lower extremities: Secondary | ICD-10-CM | POA: Diagnosis not present

## 2020-12-19 DIAGNOSIS — D839 Common variable immunodeficiency, unspecified: Secondary | ICD-10-CM | POA: Diagnosis not present

## 2020-12-19 LAB — BASIC METABOLIC PANEL
BUN: 15 mg/dL (ref 6–23)
CO2: 28 mEq/L (ref 19–32)
Calcium: 9.6 mg/dL (ref 8.4–10.5)
Chloride: 98 mEq/L (ref 96–112)
Creatinine, Ser: 0.69 mg/dL (ref 0.40–1.20)
GFR: 89.18 mL/min (ref >60.00)
Glucose, Bld: 123 mg/dL — ABNORMAL HIGH (ref 70–99)
Potassium: 3.7 mEq/L (ref 3.5–5.1)
Sodium: 137 mEq/L (ref 135–145)

## 2020-12-19 LAB — CBC WITH DIFFERENTIAL/PLATELET
Basophils Absolute: 0 10*3/uL (ref 0.0–0.1)
Basophils Relative: 0.7 % (ref 0.0–3.0)
Eosinophils Absolute: 0.1 10*3/uL (ref 0.0–0.7)
Eosinophils Relative: 1.7 % (ref 0.0–5.0)
HCT: 35 % — ABNORMAL LOW (ref 36.0–46.0)
Hemoglobin: 11.1 g/dL — ABNORMAL LOW (ref 12.0–15.0)
Lymphocytes Relative: 23.4 % (ref 12.0–46.0)
Lymphs Abs: 1.7 10*3/uL (ref 0.7–4.0)
MCHC: 31.8 g/dL (ref 30.0–36.0)
MCV: 84.1 fl (ref 78.0–100.0)
Monocytes Absolute: 0.6 10*3/uL (ref 0.1–1.0)
Monocytes Relative: 8 % (ref 3.0–12.0)
Neutro Abs: 4.7 10*3/uL (ref 1.4–7.7)
Neutrophils Relative %: 66.2 % (ref 43.0–77.0)
Platelets: 370 10*3/uL (ref 150.0–400.0)
RBC: 4.16 Mil/uL (ref 3.87–5.11)
RDW: 16.2 % — ABNORMAL HIGH (ref 11.5–15.5)
WBC: 7.2 10*3/uL (ref 4.0–10.5)

## 2020-12-19 NOTE — Progress Notes (Signed)
Subjective:  Patient ID: Kayla [name], female    DOB: 1950/12/16  Age: 70 y.o. MRN: [id number]  CC: Hypothyroidism and Anemia  This visit occurred during the SARS-CoV-2 public health emergency.  Safety protocols were in place, including screening questions prior to the visit, additional usage of staff PPE, and extensive cleaning of exam room while observing appropriate contact time as indicated for disinfecting solutions.    HPI DEERICA [name] presents for f/up and to establish.  She is being treated for refractory bacterial pneumonia with IV Rocephin.  She continues to have a cough productive of purulent phlegm.  During her recent admission she was found to be anemic.  She is not aware of any sources of blood loss and she denies paresthesias.  Outpatient Medications Prior to Visit  Medication Sig Dispense Refill   albuterol (VENTOLIN HFA) 108 (90 Base) MCG/ACT inhaler INHALE 2 PUFFS INTO LUNGS EVERY 6 HOURS AS NEEDED. 18 g 0   amphetamine-dextroamphetamine (ADDERALL) 20 MG tablet Take 20 mg by mouth daily.     baclofen (LIORESAL) 20 MG tablet Take 20 mg by mouth 3 (three) times daily as needed for muscle spasms.     CALCIUM PO Take 1 tablet by mouth daily.     dalfampridine 10 MG TB12 Take 10 mg by mouth 2 (two) times daily.     dexlansoprazole (DEXILANT) 60 MG capsule TAKE 1 CAPSULE BY MOUTH  ONCE DAILY WITH BREAKFAST 90 capsule 2   famotidine (PEPCID) 20 MG tablet One after supper 30 tablet 11   FLUoxetine (PROZAC) 40 MG capsule Take 40 mg by mouth daily.     furosemide (LASIX) 20 MG tablet TAKE 1 TABLET BY MOUTH  DAILY 90 tablet 3   gabapentin (NEURONTIN) 600 MG tablet Take 1 tablet (600 mg total) by mouth 3 (three) times daily. 90 tablet 2   Immune Globulin, Human,-klhw (XEMBIFY [place]) Inject into the skin.     levothyroxine (SYNTHROID) 100 MCG tablet TAKE 1 TABLET BY MOUTH ONCE DAILY BEFORE BREAKFAST 90 tablet 3   mometasone-formoterol (DULERA) 100-5 MCG/ACT AERO USE 2 PUFFS EVERY  12 HOURS. 3 each 3   Multiple Vitamin (MULTIVITAMIN WITH MINERALS) TABS tablet Take 1 tablet by mouth daily.     MYRBETRIQ 25 MG TB24 tablet Take 25 mg by mouth daily.     pilocarpine (SALAGEN) 7.5 MG tablet Take 7.5 mg by mouth 2 (two) times daily.     Probiotic Product (PROBIOTIC PO) Take 1 capsule by mouth daily. Harmony probiotic     Respiratory Therapy Supplies (FLUTTER) DEVI Use as directed 1 each 0   tamsulosin (FLOMAX) 0.4 MG CAPS capsule Take 0.4 mg by mouth daily.     temazepam (RESTORIL) 15 MG capsule Take 15 mg by mouth at bedtime.     acetaminophen (TYLENOL) 500 MG tablet Take 500 mg by mouth every 6 (six) hours as needed for mild pain, fever or headache.     benzonatate (TESSALON) 200 MG capsule Take 1 capsule (200 mg total) by mouth 3 (three) times daily. 20 capsule 0   cefTRIAXone (ROCEPHIN) 10 g injection Inject 1 g into the vein daily.     ocrelizumab (OCREVUS) 300 MG/10ML injection Inject 300 mg into the vein every 6 (six) months.      No facility-administered medications prior to visit.    ROS Review of Systems  Constitutional:  Positive for fatigue. Negative for chills, diaphoresis and unexpected weight change.  HENT:  Positive for trouble swallowing.  Eyes: Negative.   Respiratory:  Positive for cough and shortness of breath. Negative for wheezing.   Cardiovascular:  Negative for chest pain, palpitations and leg swelling.  Gastrointestinal:  Negative for abdominal pain, blood in stool, constipation, diarrhea and vomiting.  Endocrine: Negative.   Genitourinary: Negative.   Musculoskeletal:  Positive for gait problem.  Skin: Negative.   Neurological:  Positive for weakness. Negative for dizziness and light-headedness.  Hematological:  Negative for adenopathy. Does not bruise/bleed easily.  Psychiatric/Behavioral: Negative.     Objective:  BP 110/72 (BP Location: Right Arm, Patient Position: Sitting, Cuff Size: Normal)   Pulse 64   Temp 98 F (36.7 C) (Oral)    Resp 20   Ht 5\' 3"  (1.6 m)   Wt 118 lb (53.5 kg)   SpO2 99%   BMI 20.90 kg/m   BP Readings from Last 3 Encounters:  12/19/20 110/72  11/29/20 118/62  11/08/20 116/60    Wt Readings from Last 3 Encounters:  12/19/20 118 lb (53.5 kg)  11/29/20 114 lb 12.8 oz (52.1 kg)  11/08/20 114 lb 3.2 oz (51.8 kg)    Physical Exam Vitals reviewed.  Constitutional:      Appearance: She is cachectic. She is ill-appearing.  HENT:     Nose: Nose normal.  Eyes:     Conjunctiva/sclera: Conjunctivae normal.  Cardiovascular:     Rate and Rhythm: Normal rate and regular rhythm.     Heart sounds: No murmur heard. Pulmonary:     Breath sounds: Examination of the right-upper field reveals decreased breath sounds. Examination of the left-upper field reveals decreased breath sounds. Examination of the right-middle field reveals decreased breath sounds. Examination of the left-middle field reveals decreased breath sounds. Examination of the right-lower field reveals decreased breath sounds. Examination of the left-lower field reveals decreased breath sounds. Decreased breath sounds present. No wheezing or rhonchi.  Abdominal:     General: Abdomen is flat.     Palpations: There is no mass.     Tenderness: There is no abdominal tenderness. There is no guarding.     Hernia: No hernia is present.  Musculoskeletal:        General: Normal range of motion.     Cervical back: Neck supple.  Lymphadenopathy:     Cervical: No cervical adenopathy.  Skin:    General: Skin is warm.     Coloration: Skin is pale.     Findings: No rash.  Neurological:     General: No focal deficit present.     Mental Status: She is alert.  Psychiatric:        Mood and Affect: Mood normal.        Behavior: Behavior normal.    Lab Results  Component Value Date   WBC 7.2 12/19/2020   HGB 11.1 (L) 12/19/2020   HCT 35.0 (L) 12/19/2020   PLT 370.0 12/19/2020   GLUCOSE 123 (H) 12/19/2020   CHOL 262 (H) 07/26/2020   TRIG 55.0  07/26/2020   HDL 129.90 07/26/2020   LDLDIRECT 116.1 06/25/2010   LDLCALC 121 (H) 07/26/2020   ALT 15 10/23/2020   AST 20 10/23/2020   NA 137 12/19/2020   K 3.7 12/19/2020   CL 98 12/19/2020   CREATININE 0.69 12/19/2020   BUN 15 12/19/2020   CO2 28 12/19/2020   TSH 0.56 12/19/2020   INR 1.0 10/25/2020   HGBA1C 5.6 02/25/2013    DG Chest 2 View  Result Date: 11/09/2020 CLINICAL DATA:  Pneumonia. EXAM:  CHEST - 2 VIEW COMPARISON:  October 26, 2020. FINDINGS: The heart size and mediastinal contours are within normal limits. Right lung is clear. Significantly decreased left upper and lower lobe airspace opacities are noted consistent with improving pneumonia. The visualized skeletal structures are unremarkable. IMPRESSION: Significantly decreased left upper and lower lobe opacities are noted consistent with improving pneumonia, although continued radiographic follow-up is recommended to ensure complete resolution. Electronically Signed   By: Marijo Conception M.D.   On: 11/09/2020 08:43    Assessment & Plan:   Cinderella was seen today for hypothyroidism and anemia.  Diagnoses and all orders for this visit:  Hypothyroidism- Her TSH is in the normal range.  She will stay on the current dose of levothyroxine. -     TSH; Future -     TSH  Hyponatremia- Her sodium level is normal now. -     Basic metabolic panel; Future -     Cortisol; Future -     Cortisol -     Basic metabolic panel  Deficiency anemia- Will screen for vitamin deficiencies.  -     CBC with Differential/Platelet; Future -     Vitamin B12; Future -     IBC + Ferritin; Future -     Folate; Future -     Vitamin B1; Future -     Reticulocytes; Future -     Reticulocytes -     Vitamin B1 -     Folate -     IBC + Ferritin -     Vitamin B12 -     CBC with Differential/Platelet  Iron deficiency anemia secondary to inadequate dietary iron intake- I recommended that she start taking an iron supplement. -     Ferric Maltol  (ACCRUFER) 30 MG CAPS; Take 1 capsule by mouth in the morning and at bedtime.  I have discontinued Melinda [place]'s ocrelizumab, acetaminophen, benzonatate, and cefTRIAXone. I am also having her start on ACCRUFeR. Additionally, I am having her maintain her FLUoxetine, baclofen, amphetamine-dextroamphetamine, multivitamin with minerals, temazepam, Flutter, Myrbetriq, dalfampridine, levothyroxine, Dulera, furosemide, dexlansoprazole, famotidine, tamsulosin, gabapentin, pilocarpine, Probiotic Product (PROBIOTIC PO), CALCIUM PO, albuterol, and (Immune Globulin, Human,-klhw (XEMBIFY [place])).  Meds ordered this encounter  Medications   Ferric Maltol (ACCRUFER) 30 MG CAPS    Sig: Take 1 capsule by mouth in the morning and at bedtime.    Dispense:  180 capsule    Refill:  0     Follow-up: No follow-ups on file.  Scarlette Calico, MD

## 2020-12-20 DIAGNOSIS — D508 Other iron deficiency anemias: Secondary | ICD-10-CM | POA: Insufficient documentation

## 2020-12-20 DIAGNOSIS — Z792 Long term (current) use of antibiotics: Secondary | ICD-10-CM | POA: Diagnosis not present

## 2020-12-20 DIAGNOSIS — N319 Neuromuscular dysfunction of bladder, unspecified: Secondary | ICD-10-CM | POA: Diagnosis not present

## 2020-12-20 DIAGNOSIS — J188 Other pneumonia, unspecified organism: Secondary | ICD-10-CM | POA: Diagnosis not present

## 2020-12-20 DIAGNOSIS — Z466 Encounter for fitting and adjustment of urinary device: Secondary | ICD-10-CM | POA: Diagnosis not present

## 2020-12-20 DIAGNOSIS — Z452 Encounter for adjustment and management of vascular access device: Secondary | ICD-10-CM | POA: Diagnosis not present

## 2020-12-20 DIAGNOSIS — J440 Chronic obstructive pulmonary disease with acute lower respiratory infection: Secondary | ICD-10-CM | POA: Diagnosis not present

## 2020-12-20 LAB — FOLATE: Folate: 21.9 ng/mL (ref >5.9)

## 2020-12-20 LAB — IBC + FERRITIN
Ferritin: 9.3 ng/mL — ABNORMAL LOW (ref 10.0–291.0)
Iron: 33 ug/dL — ABNORMAL LOW (ref 42–145)
Saturation Ratios: 7.6 % — ABNORMAL LOW (ref 20.0–50.0)
TIBC: 432.6 ug/dL (ref 250.0–450.0)
Transferrin: 309 mg/dL (ref 212.0–360.0)

## 2020-12-20 LAB — TSH: TSH: 0.56 u[IU]/mL (ref 0.35–5.50)

## 2020-12-20 LAB — CORTISOL: Cortisol, Plasma: 7.6 ug/dL

## 2020-12-20 LAB — VITAMIN B12: Vitamin B-12: 847 pg/mL (ref 211–911)

## 2020-12-20 MED ORDER — ACCRUFER 30 MG PO CAPS: 1.0000 | ORAL_CAPSULE | Freq: Two times a day (BID) | ORAL | 0 refills | Status: DC

## 2020-12-23 LAB — RETICULOCYTES
ABS Retic: 37440 cells/uL (ref 20000–80000)
Retic Ct Pct: 0.9 %

## 2020-12-23 LAB — VITAMIN B1: Vitamin B1 (Thiamine): 30 nmol/L (ref 8–30)

## 2020-12-24 ENCOUNTER — Other Ambulatory Visit: Payer: Self-pay | Admitting: Internal Medicine

## 2020-12-24 ENCOUNTER — Encounter: Payer: Self-pay | Admitting: Internal Medicine

## 2020-12-24 DIAGNOSIS — J189 Pneumonia, unspecified organism: Secondary | ICD-10-CM

## 2020-12-24 MED ORDER — BENZONATATE 200 MG PO CAPS: 200.0000 mg | ORAL_CAPSULE | Freq: Two times a day (BID) | ORAL | 1 refills | Status: DC | PRN

## 2020-12-25 ENCOUNTER — Other Ambulatory Visit: Payer: Self-pay | Admitting: Internal Medicine

## 2020-12-26 ENCOUNTER — Encounter: Payer: Self-pay | Admitting: Internal Medicine

## 2020-12-26 ENCOUNTER — Other Ambulatory Visit: Payer: Self-pay | Admitting: Internal Medicine

## 2020-12-26 ENCOUNTER — Ambulatory Visit (INDEPENDENT_AMBULATORY_CARE_PROVIDER_SITE_OTHER): Payer: Medicare Other

## 2020-12-26 DIAGNOSIS — J189 Pneumonia, unspecified organism: Secondary | ICD-10-CM | POA: Insufficient documentation

## 2020-12-26 DIAGNOSIS — J449 Chronic obstructive pulmonary disease, unspecified: Secondary | ICD-10-CM | POA: Diagnosis not present

## 2020-12-26 DIAGNOSIS — R059 Cough, unspecified: Secondary | ICD-10-CM | POA: Diagnosis not present

## 2020-12-27 DIAGNOSIS — Z792 Long term (current) use of antibiotics: Secondary | ICD-10-CM | POA: Diagnosis not present

## 2020-12-27 DIAGNOSIS — D801 Nonfamilial hypogammaglobulinemia: Secondary | ICD-10-CM | POA: Diagnosis not present

## 2020-12-27 DIAGNOSIS — J188 Other pneumonia, unspecified organism: Secondary | ICD-10-CM | POA: Diagnosis not present

## 2020-12-27 DIAGNOSIS — Z466 Encounter for fitting and adjustment of urinary device: Secondary | ICD-10-CM | POA: Diagnosis not present

## 2020-12-27 DIAGNOSIS — D839 Common variable immunodeficiency, unspecified: Secondary | ICD-10-CM | POA: Diagnosis not present

## 2020-12-27 DIAGNOSIS — N319 Neuromuscular dysfunction of bladder, unspecified: Secondary | ICD-10-CM | POA: Diagnosis not present

## 2020-12-27 DIAGNOSIS — Z452 Encounter for adjustment and management of vascular access device: Secondary | ICD-10-CM | POA: Diagnosis not present

## 2020-12-27 DIAGNOSIS — J440 Chronic obstructive pulmonary disease with acute lower respiratory infection: Secondary | ICD-10-CM | POA: Diagnosis not present

## 2021-01-01 DIAGNOSIS — J440 Chronic obstructive pulmonary disease with acute lower respiratory infection: Secondary | ICD-10-CM | POA: Diagnosis not present

## 2021-01-01 DIAGNOSIS — Z452 Encounter for adjustment and management of vascular access device: Secondary | ICD-10-CM | POA: Diagnosis not present

## 2021-01-01 DIAGNOSIS — N319 Neuromuscular dysfunction of bladder, unspecified: Secondary | ICD-10-CM | POA: Diagnosis not present

## 2021-01-01 DIAGNOSIS — Z792 Long term (current) use of antibiotics: Secondary | ICD-10-CM | POA: Diagnosis not present

## 2021-01-01 DIAGNOSIS — J188 Other pneumonia, unspecified organism: Secondary | ICD-10-CM | POA: Diagnosis not present

## 2021-01-01 DIAGNOSIS — Z466 Encounter for fitting and adjustment of urinary device: Secondary | ICD-10-CM | POA: Diagnosis not present

## 2021-01-02 DIAGNOSIS — D801 Nonfamilial hypogammaglobulinemia: Secondary | ICD-10-CM | POA: Diagnosis not present

## 2021-01-02 DIAGNOSIS — D839 Common variable immunodeficiency, unspecified: Secondary | ICD-10-CM | POA: Diagnosis not present

## 2021-01-09 DIAGNOSIS — D72819 Decreased white blood cell count, unspecified: Secondary | ICD-10-CM | POA: Diagnosis not present

## 2021-01-09 DIAGNOSIS — F32A Depression, unspecified: Secondary | ICD-10-CM | POA: Diagnosis not present

## 2021-01-09 DIAGNOSIS — N319 Neuromuscular dysfunction of bladder, unspecified: Secondary | ICD-10-CM | POA: Diagnosis not present

## 2021-01-09 DIAGNOSIS — D801 Nonfamilial hypogammaglobulinemia: Secondary | ICD-10-CM | POA: Diagnosis not present

## 2021-01-09 DIAGNOSIS — F988 Other specified behavioral and emotional disorders with onset usually occurring in childhood and adolescence: Secondary | ICD-10-CM | POA: Diagnosis not present

## 2021-01-09 DIAGNOSIS — R112 Nausea with vomiting, unspecified: Secondary | ICD-10-CM | POA: Diagnosis not present

## 2021-01-09 DIAGNOSIS — D849 Immunodeficiency, unspecified: Secondary | ICD-10-CM | POA: Diagnosis not present

## 2021-01-09 DIAGNOSIS — D472 Monoclonal gammopathy: Secondary | ICD-10-CM | POA: Diagnosis not present

## 2021-01-09 DIAGNOSIS — D8489 Other immunodeficiencies: Secondary | ICD-10-CM | POA: Diagnosis not present

## 2021-01-09 DIAGNOSIS — D839 Common variable immunodeficiency, unspecified: Secondary | ICD-10-CM | POA: Diagnosis not present

## 2021-01-09 DIAGNOSIS — G35 Multiple sclerosis: Secondary | ICD-10-CM | POA: Diagnosis not present

## 2021-01-09 DIAGNOSIS — J449 Chronic obstructive pulmonary disease, unspecified: Secondary | ICD-10-CM | POA: Diagnosis not present

## 2021-01-14 ENCOUNTER — Other Ambulatory Visit: Payer: Self-pay | Admitting: Internal Medicine

## 2021-01-14 ENCOUNTER — Encounter: Payer: Self-pay | Admitting: Internal Medicine

## 2021-01-14 DIAGNOSIS — J418 Mixed simple and mucopurulent chronic bronchitis: Secondary | ICD-10-CM

## 2021-01-14 DIAGNOSIS — J45991 Cough variant asthma: Secondary | ICD-10-CM

## 2021-01-14 DIAGNOSIS — J449 Chronic obstructive pulmonary disease, unspecified: Secondary | ICD-10-CM | POA: Insufficient documentation

## 2021-01-14 MED ORDER — TRELEGY ELLIPTA 100-62.5-25 MCG/ACT IN AEPB: 1.0000 | INHALATION_SPRAY | Freq: Every day | RESPIRATORY_TRACT | 1 refills | Status: DC

## 2021-01-15 DIAGNOSIS — G35 Multiple sclerosis: Secondary | ICD-10-CM | POA: Diagnosis not present

## 2021-01-15 DIAGNOSIS — Z23 Encounter for immunization: Secondary | ICD-10-CM | POA: Diagnosis not present

## 2021-01-16 ENCOUNTER — Ambulatory Visit: Payer: Medicare Other

## 2021-01-16 ENCOUNTER — Telehealth: Payer: Self-pay | Admitting: Gastroenterology

## 2021-01-16 NOTE — Telephone Encounter (Signed)
Instruction printed and mailed. Also instructions available on my chart.

## 2021-01-16 NOTE — Telephone Encounter (Signed)
Patient called and changed the date of her Endo/Colon to 03/05/21 at 2:30 p.m. (arrival at 1:30 p.m.)  With the change in time of the appointment, can you please send her new prep instructions to reflect the new time.  Thank you.

## 2021-01-24 ENCOUNTER — Encounter: Payer: Medicare Other | Admitting: Gastroenterology

## 2021-01-30 ENCOUNTER — Ambulatory Visit: Payer: Medicare Other | Admitting: Family Medicine

## 2021-02-01 DIAGNOSIS — Z1231 Encounter for screening mammogram for malignant neoplasm of breast: Secondary | ICD-10-CM | POA: Diagnosis not present

## 2021-02-05 DIAGNOSIS — Z23 Encounter for immunization: Secondary | ICD-10-CM | POA: Diagnosis not present

## 2021-02-08 ENCOUNTER — Encounter: Payer: Self-pay | Admitting: Internal Medicine

## 2021-02-11 ENCOUNTER — Ambulatory Visit: Payer: Medicare Other

## 2021-02-11 ENCOUNTER — Other Ambulatory Visit: Payer: Self-pay | Admitting: Internal Medicine

## 2021-02-11 DIAGNOSIS — R053 Chronic cough: Secondary | ICD-10-CM | POA: Insufficient documentation

## 2021-02-12 ENCOUNTER — Ambulatory Visit (INDEPENDENT_AMBULATORY_CARE_PROVIDER_SITE_OTHER): Payer: Medicare Other

## 2021-02-12 DIAGNOSIS — J479 Bronchiectasis, uncomplicated: Secondary | ICD-10-CM | POA: Diagnosis not present

## 2021-02-12 DIAGNOSIS — R053 Chronic cough: Secondary | ICD-10-CM

## 2021-02-12 DIAGNOSIS — R059 Cough, unspecified: Secondary | ICD-10-CM | POA: Diagnosis not present

## 2021-02-12 DIAGNOSIS — R0602 Shortness of breath: Secondary | ICD-10-CM | POA: Diagnosis not present

## 2021-02-13 ENCOUNTER — Encounter: Payer: Self-pay | Admitting: Internal Medicine

## 2021-02-13 ENCOUNTER — Other Ambulatory Visit: Payer: Self-pay | Admitting: Internal Medicine

## 2021-02-13 DIAGNOSIS — G35 Multiple sclerosis: Secondary | ICD-10-CM | POA: Diagnosis not present

## 2021-02-13 DIAGNOSIS — S42211D Unspecified displaced fracture of surgical neck of right humerus, subsequent encounter for fracture with routine healing: Secondary | ICD-10-CM | POA: Diagnosis not present

## 2021-02-13 DIAGNOSIS — R296 Repeated falls: Secondary | ICD-10-CM | POA: Diagnosis not present

## 2021-02-13 DIAGNOSIS — J189 Pneumonia, unspecified organism: Secondary | ICD-10-CM | POA: Insufficient documentation

## 2021-02-13 DIAGNOSIS — R260 Ataxic gait: Secondary | ICD-10-CM | POA: Diagnosis not present

## 2021-02-13 MED ORDER — AMOXICILLIN-POT CLAVULANATE 875-125 MG PO TABS: 1.0000 | ORAL_TABLET | Freq: Two times a day (BID) | ORAL | 0 refills | Status: AC

## 2021-02-18 ENCOUNTER — Ambulatory Visit (INDEPENDENT_AMBULATORY_CARE_PROVIDER_SITE_OTHER): Payer: Medicare Other

## 2021-02-18 ENCOUNTER — Encounter: Payer: Self-pay | Admitting: Internal Medicine

## 2021-02-18 ENCOUNTER — Ambulatory Visit (INDEPENDENT_AMBULATORY_CARE_PROVIDER_SITE_OTHER): Payer: Medicare Other | Admitting: Internal Medicine

## 2021-02-18 ENCOUNTER — Other Ambulatory Visit: Payer: Self-pay

## 2021-02-18 VITALS — BP 104/68 | HR 64 | Temp 97.8°F | Resp 16 | Ht 63.0 in | Wt 115.0 lb

## 2021-02-18 DIAGNOSIS — J189 Pneumonia, unspecified organism: Secondary | ICD-10-CM | POA: Diagnosis not present

## 2021-02-18 DIAGNOSIS — J418 Mixed simple and mucopurulent chronic bronchitis: Secondary | ICD-10-CM

## 2021-02-18 DIAGNOSIS — I75023 Atheroembolism of bilateral lower extremities: Secondary | ICD-10-CM

## 2021-02-18 DIAGNOSIS — J479 Bronchiectasis, uncomplicated: Secondary | ICD-10-CM | POA: Diagnosis not present

## 2021-02-18 DIAGNOSIS — R059 Cough, unspecified: Secondary | ICD-10-CM | POA: Diagnosis not present

## 2021-02-18 DIAGNOSIS — D508 Other iron deficiency anemias: Secondary | ICD-10-CM | POA: Diagnosis not present

## 2021-02-18 LAB — CBC WITH DIFFERENTIAL/PLATELET
Basophils Absolute: 0.1 10*3/uL (ref 0.0–0.1)
Basophils Relative: 1.7 % (ref 0.0–3.0)
Eosinophils Absolute: 0.1 10*3/uL (ref 0.0–0.7)
Eosinophils Relative: 1.8 % (ref 0.0–5.0)
HCT: 38 % (ref 36.0–46.0)
Hemoglobin: 12.4 g/dL (ref 12.0–15.0)
Lymphocytes Relative: 36.4 % (ref 12.0–46.0)
Lymphs Abs: 1.6 10*3/uL (ref 0.7–4.0)
MCHC: 32.6 g/dL (ref 30.0–36.0)
MCV: 84.8 fl (ref 78.0–100.0)
Monocytes Absolute: 0.7 10*3/uL (ref 0.1–1.0)
Monocytes Relative: 16 % — ABNORMAL HIGH (ref 3.0–12.0)
Neutro Abs: 1.9 10*3/uL (ref 1.4–7.7)
Neutrophils Relative %: 44.1 % (ref 43.0–77.0)
Platelets: 427 10*3/uL — ABNORMAL HIGH (ref 150.0–400.0)
RBC: 4.48 Mil/uL (ref 3.87–5.11)
RDW: 17.4 % — ABNORMAL HIGH (ref 11.5–15.5)
WBC: 4.3 10*3/uL (ref 4.0–10.5)

## 2021-02-18 LAB — IBC + FERRITIN
Ferritin: 26.4 ng/mL (ref 10.0–291.0)
Iron: 67 ug/dL (ref 42–145)
Saturation Ratios: 17.6 % — ABNORMAL LOW (ref 20.0–50.0)
TIBC: 380.8 ug/dL (ref 250.0–450.0)
Transferrin: 272 mg/dL (ref 212.0–360.0)

## 2021-02-18 NOTE — Telephone Encounter (Signed)
Pt has an OV today to discuss.

## 2021-02-18 NOTE — Progress Notes (Signed)
Subjective:  Patient ID: Kayla [name], female    DOB: June 27, 1950  Age: 70 y.o. MRN: [id number]  CC: Anemia, COPD, and Cough  This visit occurred during the SARS-CoV-2 public health emergency.  Safety protocols were in place, including screening questions prior to the visit, additional usage of staff PPE, and extensive cleaning of exam room while observing appropriate contact time as indicated for disinfecting solutions.    HPI Kayla [name] presents for f/up -  She sent me an email about 5 days ago complaining of worsening cough productive of yellow phlegm with shortness of breath.  She underwent a chest x-ray that showed a small left lower lobe infiltrate.  She has been taking Augmentin.  She feels somewhat better but continues to complain of cough that is productive of a scant amount of yellow phlegm.  Her shortness of breath is at baseline.  She denies wheezing, chest pain, fever, chills, or night sweats.  Outpatient Medications Prior to Visit  Medication Sig Dispense Refill   albuterol (VENTOLIN HFA) 108 (90 Base) MCG/ACT inhaler INHALE 2 PUFFS INTO LUNGS EVERY 6 HOURS AS NEEDED. 18 g 0   amoxicillin-clavulanate (AUGMENTIN) 875-125 MG tablet Take 1 tablet by mouth 2 (two) times daily for 10 days. 20 tablet 0   amphetamine-dextroamphetamine (ADDERALL) 20 MG tablet Take 20 mg by mouth daily.     baclofen (LIORESAL) 20 MG tablet Take 20 mg by mouth 3 (three) times daily as needed for muscle spasms.     benzonatate (TESSALON) 200 MG capsule Take 1 capsule (200 mg total) by mouth 2 (two) times daily as needed for cough. 60 capsule 1   CALCIUM PO Take 1 tablet by mouth daily.     dalfampridine 10 MG TB12 Take 10 mg by mouth 2 (two) times daily.     dexlansoprazole (DEXILANT) 60 MG capsule TAKE 1 CAPSULE BY MOUTH  ONCE DAILY WITH BREAKFAST 90 capsule 3   famotidine (PEPCID) 20 MG tablet One after supper 30 tablet 11   Ferric Maltol (ACCRUFER) 30 MG CAPS Take 1 capsule by mouth in the  morning and at bedtime. 180 capsule 0   FLUoxetine (PROZAC) 40 MG capsule Take 40 mg by mouth daily.     Fluticasone-Umeclidin-Vilant (TRELEGY ELLIPTA) 100-62.5-25 MCG/ACT AEPB Inhale 1 puff into the lungs daily. 120 each 1   furosemide (LASIX) 20 MG tablet TAKE 1 TABLET BY MOUTH  DAILY 90 tablet 3   gabapentin (NEURONTIN) 600 MG tablet Take 1 tablet (600 mg total) by mouth 3 (three) times daily. 90 tablet 2   Immune Globulin, Human,-klhw (XEMBIFY [place]) Inject into the skin.     levothyroxine (SYNTHROID) 100 MCG tablet TAKE 1 TABLET BY MOUTH ONCE DAILY BEFORE BREAKFAST 90 tablet 3   Multiple Vitamin (MULTIVITAMIN WITH MINERALS) TABS tablet Take 1 tablet by mouth daily.     MYRBETRIQ 25 MG TB24 tablet Take 25 mg by mouth daily.     pilocarpine (SALAGEN) 7.5 MG tablet Take 7.5 mg by mouth 2 (two) times daily.     Probiotic Product (PROBIOTIC PO) Take 1 capsule by mouth daily. Harmony probiotic     Respiratory Therapy Supplies (FLUTTER) DEVI Use as directed 1 each 0   tamsulosin (FLOMAX) 0.4 MG CAPS capsule Take 0.4 mg by mouth daily.     temazepam (RESTORIL) 15 MG capsule Take 15 mg by mouth at bedtime.     No facility-administered medications prior to visit.    ROS Review of Systems  Constitutional:  Negative for chills, diaphoresis, fatigue and fever.  HENT: Negative.  Negative for sinus pressure and sore throat.   Respiratory:  Positive for cough and shortness of breath. Negative for chest tightness and wheezing.   Cardiovascular:  Negative for chest pain, palpitations and leg swelling.  Gastrointestinal:  Negative for abdominal pain, diarrhea and nausea.  Genitourinary: Negative.  Negative for difficulty urinating.  Musculoskeletal: Negative.  Negative for arthralgias and myalgias.  Skin: Negative.  Negative for color change and pallor.  Neurological:  Negative for dizziness, weakness and light-headedness.  Hematological:  Negative for adenopathy. Does not bruise/bleed easily.   Psychiatric/Behavioral: Negative.     Objective:  BP 104/68 (BP Location: Left Arm, Patient Position: Sitting, Cuff Size: Large)   Pulse 64   Temp 97.8 F (36.6 C) (Oral)   Resp 16   Ht 5\' 3"  (1.6 m)   Wt 115 lb (52.2 kg)   SpO2 93%   BMI 20.37 kg/m   BP Readings from Last 3 Encounters:  02/18/21 104/68  12/19/20 110/72  11/29/20 118/62    Wt Readings from Last 3 Encounters:  02/18/21 115 lb (52.2 kg)  12/19/20 118 lb (53.5 kg)  11/29/20 114 lb 12.8 oz (52.1 kg)    Physical Exam Vitals reviewed.  Constitutional:      General: She is not in acute distress.    Appearance: She is ill-appearing. She is not toxic-appearing or diaphoretic.  HENT:     Nose: Nose normal.     Mouth/Throat:     Mouth: Mucous membranes are moist.  Eyes:     General: No scleral icterus.    Conjunctiva/sclera: Conjunctivae normal.  Cardiovascular:     Rate and Rhythm: Normal rate and regular rhythm.     Heart sounds: No murmur heard. Pulmonary:     Effort: Tachypnea, accessory muscle usage and prolonged expiration present. No respiratory distress.     Breath sounds: No stridor. No wheezing, rhonchi or rales.  Abdominal:     General: Abdomen is flat.     Palpations: There is no mass.     Tenderness: There is no abdominal tenderness. There is no guarding.     Hernia: No hernia is present.  Musculoskeletal:        General: Normal range of motion.     Cervical back: Neck supple.     Right lower leg: No edema.     Left lower leg: No edema.  Lymphadenopathy:     Cervical: No cervical adenopathy.  Skin:    General: Skin is warm and dry.  Neurological:     General: No focal deficit present.     Mental Status: She is alert.  Psychiatric:        Mood and Affect: Mood normal.    Lab Results  Component Value Date   WBC 4.3 02/18/2021   HGB 12.4 02/18/2021   HCT 38.0 02/18/2021   PLT 427.0 (H) 02/18/2021   GLUCOSE 123 (H) 12/19/2020   CHOL 262 (H) 07/26/2020   TRIG 55.0 07/26/2020    HDL 129.90 07/26/2020   LDLDIRECT 116.1 06/25/2010   LDLCALC 121 (H) 07/26/2020   ALT 15 10/23/2020   AST 20 10/23/2020   NA 137 12/19/2020   K 3.7 12/19/2020   CL 98 12/19/2020   CREATININE 0.69 12/19/2020   BUN 15 12/19/2020   CO2 28 12/19/2020   TSH 0.56 12/19/2020   INR 1.0 10/25/2020   HGBA1C 5.6 02/25/2013    DG Chest 2 View  Result Date: 11/09/2020 CLINICAL DATA:  Pneumonia. EXAM: CHEST - 2 VIEW COMPARISON:  October 26, 2020. FINDINGS: The heart size and mediastinal contours are within normal limits. Right lung is clear. Significantly decreased left upper and lower lobe airspace opacities are noted consistent with improving pneumonia. The visualized skeletal structures are unremarkable. IMPRESSION: Significantly decreased left upper and lower lobe opacities are noted consistent with improving pneumonia, although continued radiographic follow-up is recommended to ensure complete resolution. Electronically Signed   By: Marijo Conception M.D.   On: 11/09/2020 08:43   CLINICAL DATA:  Constant productive cough, congestion and shortness of breath for 8 months.   EXAM: CHEST - 2 VIEW   COMPARISON:  X-ray chest 12/26/2020.   FINDINGS: Patchy density at the left lung base. There is cylindrical bronchiectasis. No pleural effusion. Stable cardiomediastinal contours. No acute osseous abnormality.   IMPRESSION: Small atelectasis/consolidation at the left lung base. Bronchiectasis.    Electronically Signed   By: Macy Mis M.D.   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 EXAM: CHEST - 2 VIEW COMPARISON:  X-ray chest 02/12/2021. FINDINGS: The heart size and mediastinal contours are within normal limits. No pleural effusion or edema. Continued interval improvement in aeration to the left lower lobe with resolving airspace disease. There is residual mild  bronchiectasis and postinflammatory scarring noted within the left lower lobe. The visualized skeletal structures are unremarkable. IMPRESSION: Interval resolution left lower lobe airspace disease with residual scarring and bronchiectasis. Electronically Signed   By: Kerby Moors M.D.   On: 02/19/2021 14:17     Assessment & Plan:   [place] was seen today for anemia, copd and cough.  Diagnoses and all orders for this visit:  Pneumonia of left lung due to infectious organism, unspecified part of lung- Based on her chest x-ray this has resolved.  I recommended that she complete the course of Augmentin. -     DG Chest 2 View; Future -     CBC with Differential/Platelet; Future -     CBC with Differential/Platelet  Iron deficiency anemia secondary to inadequate dietary iron intake- Her H&H and iron levels are normal now but her platelet count remains elevated.  I recommended that she continue taking the iron supplement. -     IBC + Ferritin; Future -     CBC with Differential/Platelet; Future -     CBC with Differential/Platelet -     IBC + Ferritin  Mixed simple and mucopurulent chronic bronchitis ([place])- Will continue the LAMA/LABA/ICS inhaler.  I am having Kayla [name] maintain her FLUoxetine, baclofen, amphetamine-dextroamphetamine, multivitamin with minerals, temazepam, Flutter, Myrbetriq, dalfampridine, furosemide, famotidine, tamsulosin, gabapentin, pilocarpine, Probiotic Product (PROBIOTIC PO), CALCIUM PO, albuterol, (Immune Globulin, Human,-klhw (XEMBIFY [place])), ACCRUFeR, benzonatate, dexlansoprazole, levothyroxine, Trelegy Ellipta, and amoxicillin-clavulanate.  No orders of the defined types were placed in this encounter.    Follow-up: Return in about 3 months (around 05/21/2021).  Scarlette Calico, MD

## 2021-02-18 NOTE — Patient Instructions (Signed)
Community-Acquired Pneumonia, Adult ?Pneumonia is a lung infection that causes inflammation and the buildup of mucus and fluids in the lungs. This may cause coughing and difficulty breathing. Community-acquired pneumonia is pneumonia that develops in people who are not, and have not recently been, in a hospital or other health care facility. ?Usually, pneumonia develops as a result of an illness that is caused by a virus, such as the common cold and the flu (influenza). It can also be caused by bacteria or fungi. While the common cold and influenza can pass from person to person (are contagious), pneumonia itself is not considered contagious. ?What are the causes? ?This condition may be caused by: ?Viruses. ?Bacteria. ?Fungi, such as molds or mushrooms. ?What increases the risk? ?The following factors may make you more likely to develop this condition: ?Having certain medical conditions, such as: ?A long-term (chronic) disease, which may include chronic obstructive pulmonary disease (COPD), asthma, heart failure, cystic fibrosis, diabetes, kidney disease, sickle cell disease, and human immunodeficiency virus (HIV). ?A condition that increases the risk of breathing in (aspirating) mucus and other fluids from your mouth and nose. ?A weakened body defense system (immune system). ?Having had your spleen removed (splenectomy). The spleen is the organ that helps fight germs and infections. ?Not cleaning your teeth and gums well (poor dental hygiene). ?Using tobacco products. ?Traveling to places where germs that cause pneumonia are present. ?Being near certain animals, or animal habitats, that have germs that cause pneumonia. ?Being older than 68 years of age. ?What are the signs or symptoms? ?Symptoms of this condition include: ?A dry cough or a wet (productive) cough. ?A fever. ?Sweating or chills. ?Chest pain, especially when breathing deeply or coughing. ?Fast breathing, difficulty breathing, or shortness of  breath. ?Tiredness (fatigue). ?Muscle aches. ?How is this diagnosed? ?This condition may be diagnosed based on your medical history or a physical exam. You may also have tests, including: ?Chest X-rays. ?Tests of the level of oxygen and other gases in your blood. ?Tests of: ?Your blood. ?Mucus from your lungs (sputum). ?Fluid around your lungs (pleural fluid). ?Your urine. ?If your pneumonia is severe, other tests may be done to learn more about the cause. ?How is this treated? ?Treatment for this condition depends on many factors, such as the cause of your pneumonia, your medicines, and other medical conditions that you have. ?For most adults, pneumonia may be treated at home. In some cases, treatment must happen in a hospital and may include: ?Medicines that are given by mouth (orally) or through an IV, including: ?Antibiotic medicines, if bacteria caused the pneumonia. ?Medicines that kill viruses (antiviral medicines), if a virus caused the pneumonia. ?Oxygen therapy. ?Severe pneumonia, although rare, may require the following treatments: ?Mechanical ventilation.This procedure uses a machine to help you breathe if you cannot breathe well on your own or maintain a safe level of blood oxygen. ?Thoracentesis. This procedure removes any buildup of pleural fluid to help with breathing. ?Follow these instructions at home: ?Medicines ?Take over-the-counter and prescription medicines only as told by your health care provider. ?Take cough medicine only if you have trouble sleeping. Cough medicine can prevent your body from removing mucus from your lungs. ?If you were prescribed an antibiotic medicine, take it as told by your health care provider. Do not stop taking the antibiotic even if you start to feel better. ?Lifestyle ?  ?Do not drink alcohol. ?Do not use any products that contain nicotine or tobacco, such as cigarettes, e-cigarettes, and chewing tobacco. If you   need help quitting, ask your health care  provider. ?Eat a healthy diet. This includes plenty of vegetables, fruits, whole grains, low-fat dairy products, and lean protein. ?General instructions ?Rest a lot and get at least 8 hours of sleep each night. ?Sleep in a partly upright position at night. Place a few pillows under your head or sleep in a reclining chair. ?Return to your normal activities as told by your health care provider. Ask your health care provider what activities are safe for you. ?Drink enough fluid to keep your urine pale yellow. This helps to thin the mucus in your lungs. ?If your throat is sore, gargle with a salt-water mixture 3-4 times a day or as needed. To make a salt-water mixture, completely dissolve ?-1 tsp (3-6 g) of salt in 1 cup (237 mL) of warm water. ?Keep all follow-up visits as told by your health care provider. This is important. ?How is this prevented? ?You can lower your risk of developing community-acquired pneumonia by: ?Getting the pneumonia vaccine. There are different types and schedules of pneumonia vaccines. Ask your health care provider which option is best for you. Consider getting the pneumonia vaccine if: ?You are older than 68 years of age. ?You are 19-65 years of age and are receiving cancer treatment, have chronic lung disease, or have other medical conditions that affect your immune system. Ask your health care provider if this applies to you. ?Getting your influenza vaccine every year. Ask your health care provider which type of vaccine is best for you. ?Getting regular dental checkups. ?Washing your hands often with soap and water for at least 20 seconds. If soap and water are not available, use hand sanitizer. ?Contact a health care provider if you have: ?A fever. ?Trouble sleeping because you cannot control your cough with cough medicine. ?Get help right away if: ?Your shortness of breath becomes worse. ?Your chest pain increases. ?Your sickness becomes worse, especially if you are an older adult or  have a weak immune system. ?You cough up blood. ?These symptoms may represent a serious problem that is an emergency. Do not wait to see if the symptoms will go away. Get medical help right away. Call your local emergency services (911 in the [place] Do not drive yourself to the hospital. ?Summary ?Pneumonia is an infection of the lungs. ?Community-acquired pneumonia develops in people who have not been in the hospital. It can be caused by bacteria, viruses, or fungi. ?This condition may be treated with antibiotics or antiviral medicines. ?Severe pneumonia may require a hospital stay and treatment to help with breathing. ?This information is not intended to replace advice given to you by your health care provider. Make sure you discuss any questions you have with your health care provider. ?Document Revised: 12/21/2018 Document Reviewed: 12/21/2018 ?Elsevier Patient Education ? 2022 [employer] ? ?

## 2021-02-19 ENCOUNTER — Encounter: Payer: Self-pay | Admitting: Internal Medicine

## 2021-02-19 DIAGNOSIS — G35 Multiple sclerosis: Secondary | ICD-10-CM | POA: Diagnosis not present

## 2021-02-19 DIAGNOSIS — R260 Ataxic gait: Secondary | ICD-10-CM | POA: Diagnosis not present

## 2021-02-19 DIAGNOSIS — R296 Repeated falls: Secondary | ICD-10-CM | POA: Diagnosis not present

## 2021-02-19 DIAGNOSIS — S42211D Unspecified displaced fracture of surgical neck of right humerus, subsequent encounter for fracture with routine healing: Secondary | ICD-10-CM | POA: Diagnosis not present

## 2021-02-21 DIAGNOSIS — G35 Multiple sclerosis: Secondary | ICD-10-CM | POA: Diagnosis not present

## 2021-02-21 DIAGNOSIS — S42211D Unspecified displaced fracture of surgical neck of right humerus, subsequent encounter for fracture with routine healing: Secondary | ICD-10-CM | POA: Diagnosis not present

## 2021-02-21 DIAGNOSIS — R260 Ataxic gait: Secondary | ICD-10-CM | POA: Diagnosis not present

## 2021-02-21 DIAGNOSIS — R296 Repeated falls: Secondary | ICD-10-CM | POA: Diagnosis not present

## 2021-02-28 DIAGNOSIS — R260 Ataxic gait: Secondary | ICD-10-CM | POA: Diagnosis not present

## 2021-02-28 DIAGNOSIS — G35 Multiple sclerosis: Secondary | ICD-10-CM | POA: Diagnosis not present

## 2021-02-28 DIAGNOSIS — S42211D Unspecified displaced fracture of surgical neck of right humerus, subsequent encounter for fracture with routine healing: Secondary | ICD-10-CM | POA: Diagnosis not present

## 2021-02-28 DIAGNOSIS — R296 Repeated falls: Secondary | ICD-10-CM | POA: Diagnosis not present

## 2021-03-05 ENCOUNTER — Ambulatory Visit (AMBULATORY_SURGERY_CENTER): Payer: Medicare Other | Admitting: Gastroenterology

## 2021-03-05 ENCOUNTER — Other Ambulatory Visit: Payer: Self-pay

## 2021-03-05 ENCOUNTER — Encounter: Payer: Self-pay | Admitting: Gastroenterology

## 2021-03-05 VITALS — BP 119/77 | HR 88 | Temp 99.1°F | Resp 24 | Ht 62.0 in | Wt 118.0 lb

## 2021-03-05 DIAGNOSIS — Z1211 Encounter for screening for malignant neoplasm of colon: Secondary | ICD-10-CM | POA: Diagnosis not present

## 2021-03-05 DIAGNOSIS — D124 Benign neoplasm of descending colon: Secondary | ICD-10-CM

## 2021-03-05 DIAGNOSIS — K2950 Unspecified chronic gastritis without bleeding: Secondary | ICD-10-CM | POA: Diagnosis not present

## 2021-03-05 DIAGNOSIS — K2970 Gastritis, unspecified, without bleeding: Secondary | ICD-10-CM

## 2021-03-05 DIAGNOSIS — R053 Chronic cough: Secondary | ICD-10-CM | POA: Diagnosis not present

## 2021-03-05 DIAGNOSIS — K449 Diaphragmatic hernia without obstruction or gangrene: Secondary | ICD-10-CM | POA: Diagnosis not present

## 2021-03-05 DIAGNOSIS — Z800 Family history of malignant neoplasm of digestive organs: Secondary | ICD-10-CM

## 2021-03-05 DIAGNOSIS — K219 Gastro-esophageal reflux disease without esophagitis: Secondary | ICD-10-CM | POA: Diagnosis not present

## 2021-03-05 DIAGNOSIS — K317 Polyp of stomach and duodenum: Secondary | ICD-10-CM

## 2021-03-05 MED ORDER — SODIUM CHLORIDE 0.9 % IV SOLN: 500.0000 mL | Freq: Once | INTRAVENOUS | Status: DC

## 2021-03-05 NOTE — Progress Notes (Signed)
Report given to PACU, vss 

## 2021-03-05 NOTE — Progress Notes (Signed)
1441 Robinul 0.1 mg IV given due large amount of secretions upon assessment.  MD made aware, vss

## 2021-03-05 NOTE — Patient Instructions (Signed)
Handouts on Polyps, heartburn, hiatal hernia, and GERD given to patient. Antireflux measures discussed.  YOU HAD AN ENDOSCOPIC PROCEDURE TODAY AT [facility]:   Refer to the procedure report that was given to you for any specific questions about what was found during the examination.  If the procedure report does not answer your questions, please call your gastroenterologist to clarify.  If you requested that your care partner not be given the details of your procedure findings, then the procedure report has been included in a sealed envelope for you to review at your convenience later.  YOU SHOULD EXPECT: Some feelings of bloating in the abdomen. Passage of more gas than usual.  Walking can help get rid of the air that was put into your GI tract during the procedure and reduce the bloating. If you had a lower endoscopy (such as a colonoscopy or flexible sigmoidoscopy) you may notice spotting of blood in your stool or on the toilet paper. If you underwent a bowel prep for your procedure, you may not have a normal bowel movement for a few days.  Please Note:  You might notice some irritation and congestion in your nose or some drainage.  This is from the oxygen used during your procedure.  There is no need for concern and it should clear up in a day or so.  SYMPTOMS TO REPORT IMMEDIATELY:  Following lower endoscopy (colonoscopy or flexible sigmoidoscopy):  Excessive amounts of blood in the stool  Significant tenderness or worsening of abdominal pains  Swelling of the abdomen that is new, acute  Fever of 100F or higher  Following upper endoscopy (EGD)  Vomiting of blood or coffee ground material  New chest pain or pain under the shoulder blades  Painful or persistently difficult swallowing  New shortness of breath  Fever of 100F or higher  Black, tarry-looking stools  For urgent or emergent issues, a gastroenterologist can be reached at any hour by calling [phone]  [phone]. Do not use MyChart messaging for urgent concerns.    DIET:  We do recommend a small meal at first, but then you may proceed to your regular diet.  Drink plenty of fluids but you should avoid alcoholic beverages for 24 hours.  ACTIVITY:  You should plan to take it easy for the rest of today and you should NOT DRIVE or use heavy machinery until tomorrow (because of the sedation medicines used during the test).    FOLLOW UP: Our staff will call the number listed on your records 48-72 hours following your procedure to check on you and address any questions or concerns that you may have regarding the information given to you following your procedure. If we do not reach you, we will leave a message.  We will attempt to reach you two times.  During this call, we will ask if you have developed any symptoms of COVID 19. If you develop any symptoms (ie: fever, flu-like symptoms, shortness of breath, cough etc.) before then, please call [phone].  If you test positive for Covid 19 in the 2 weeks post procedure, please call and report this information to [place].    If any biopsies were taken you will be contacted by phone or by letter within the next 1-3 weeks.  Please call us at [phone] if you have not heard about the biopsies in 3 weeks.    SIGNATURES/CONFIDENTIALITY: You and/or your care partner have signed paperwork which will be entered into your electronic medical record.  These signatures attest to the fact that that the information above on your After Visit Summary has been reviewed and is understood.  Full responsibility of the confidentiality of this discharge information lies with you and/or your care-partner.  °

## 2021-03-05 NOTE — Progress Notes (Signed)
1505 Ephedrine 10 mg given IV due to low BP, MD updated.

## 2021-03-05 NOTE — Op Note (Signed)
[place] Patient Name: Kayla [name] Procedure Date: 03/05/2021 2:40 PM MRN: [id number] Endoscopist: Ladene Artist , MD Age: 70 Referring MD:  Date of Birth: 06/13/1950 Gender: Female Account #: [id number] Procedure:                Upper GI endoscopy Indications:              Gastroesophageal reflux disease, Chronic cough Medicines:                Monitored Anesthesia Care Procedure:                Pre-Anesthesia Assessment:                           - Prior to the procedure, a History and Physical                            was performed, and patient medications and                            allergies were reviewed. [name] tolerance of                            previous anesthesia was also reviewed. The risks                            and benefits of the procedure and the sedation                            options and risks were discussed with the patient.                            All questions were answered, and informed consent                            was obtained. Prior Anticoagulants: The patient has                            taken no previous anticoagulant or antiplatelet                            agents. ASA Grade Assessment: III - A patient with                            severe systemic disease. After reviewing the risks                            and benefits, the patient was deemed in                            satisfactory condition to undergo the procedure.                           After obtaining informed consent, the endoscope was  passed under direct vision. Throughout the                            procedure, the patient's blood pressure, pulse, and                            oxygen saturations were monitored continuously. The                            Olympus GIF-HQ190 #[id number] was introduced through                            the mouth, and advanced to the second part of                             duodenum. The upper GI endoscopy was accomplished                            without difficulty. The patient tolerated the                            procedure well. Scope In: Scope Out: Findings:                 The examined esophagus was normal.                           Multiple 3 to 6 mm sessile polyps with no bleeding                            and no stigmata of recent bleeding were found in                            the gastric fundus and in the gastric body.                            Biopsies were taken with a cold forceps for                            histology.                           Diffuse mild inflammation characterized by erythema                            and granularity was found in the entire examined                            stomach. Biopsies were taken with a cold forceps                            for histology.                           A small hiatal hernia was present.  The exam of the stomach was otherwise normal.                           The duodenal bulb and second portion of the                            duodenum were normal. Complications:            No immediate complications. Estimated Blood Loss:     Estimated blood loss was minimal. Impression:               - Normal esophagus.                           - Multiple gastric polyps. Biopsied.                           - Gastritis. Biopsied.                           - Small hiatal hernia.                           - Normal duodenal bulb and second portion of the                            duodenum. Recommendation:           - Patient has a contact number available for                            emergencies. The signs and symptoms of potential                            delayed complications were discussed with the                            patient. Return to normal activities tomorrow.                            Written discharge instructions were provided to the                             patient.                           - Resume previous diet.                           - Follow antireflux measures.                           - Continue present medications.                           - Await pathology results. Ladene Artist, MD 03/05/2021 3:21:51 PM This report has been signed electronically.

## 2021-03-05 NOTE — Op Note (Signed)
[place] Patient Name: Kayla [name] Procedure Date: 03/05/2021 2:42 PM MRN: [id number] Endoscopist: Ladene Artist , MD Age: 70 Referring MD:  Date of Birth: [birth year] Gender: Female Account #: [id number] Procedure:                Colonoscopy Indications:              Screening in patient at increased risk: Family                            history of 1st-degree relative with colorectal                            cancer Medicines:                Monitored Anesthesia Care Procedure:                Pre-Anesthesia Assessment:                           - Prior to the procedure, a History and Physical                            was performed, and patient medications and                            allergies were reviewed. [name] tolerance of                            previous anesthesia was also reviewed. The risks                            and benefits of the procedure and the sedation                            options and risks were discussed with the patient.                            All questions were answered, and informed consent                            was obtained. Prior Anticoagulants: The patient has                            taken no previous anticoagulant or antiplatelet                            agents. ASA Grade Assessment: III - A patient with                            severe systemic disease. After reviewing the risks                            and benefits, the patient was deemed in  satisfactory condition to undergo the procedure.                           After obtaining informed consent, the colonoscope                            was passed under direct vision. Throughout the                            procedure, the patient's blood pressure, pulse, and                            oxygen saturations were monitored continuously. The                            [id number]-HQ190L Colonoscope was introduced through the                             anus and advanced to the the cecum, identified by                            appendiceal orifice and ileocecal valve. The                            ileocecal valve, appendiceal orifice, and rectum                            were photographed. The quality of the bowel                            preparation was good. The colonoscopy was performed                            without difficulty. The patient tolerated the                            procedure well. Scope In: 2:45:25 PM Scope Out: 3:04:48 PM Scope Withdrawal Time: 0 hours 13 minutes 6 seconds  Total Procedure Duration: 0 hours 19 minutes 23 seconds  Findings:                 The perianal and digital rectal examinations were                            normal.                           A 7 mm polyp was found in the descending colon. The                            polyp was sessile. The polyp was removed with a                            cold snare. Resection and retrieval were complete.  The exam was otherwise without abnormality on                            direct and retroflexion views. Complications:            No immediate complications. Estimated blood loss:                            None. Estimated Blood Loss:     Estimated blood loss: none. Impression:               - One 7 mm polyp in the descending colon, removed                            with a cold snare. Resected and retrieved.                           - The examination was otherwise normal on direct                            and retroflexion views. Recommendation:           - Repeat colonoscopy after studies are complete for                            surveillance based on pathology results.                           - Patient has a contact number available for                            emergencies. The signs and symptoms of potential                            delayed complications were discussed with the                             patient. Return to normal activities tomorrow.                            Written discharge instructions were provided to the                            patient.                           - Resume previous diet.                           - Continue present medications.                           - Await pathology results. Ladene Artist, MD 03/05/2021 3:08:14 PM This report has been signed electronically.

## 2021-03-05 NOTE — Progress Notes (Signed)
Called to room to assist during endoscopic procedure.  Patient ID and intended procedure confirmed with present staff. Received instructions for my participation in the procedure from the performing physician.  

## 2021-03-05 NOTE — Progress Notes (Signed)
See 02/18/2021 H&P, no changes.

## 2021-03-07 ENCOUNTER — Telehealth: Payer: Self-pay | Admitting: *Deleted

## 2021-03-07 NOTE — Telephone Encounter (Signed)
°  Follow up Call-  Call back number 03/05/2021  Post procedure Call Back phone  # [phone]  Permission to leave phone message Yes  Some recent data might be hidden     Patient questions:  Do you have a fever, pain , or abdominal swelling? No. Pain Score  0 *  Have you tolerated food without any problems? Yes.    Have you been able to return to your normal activities? Yes.    Do you have any questions about your discharge instructions: Diet   No. Medications  No. Follow up visit  No.  Do you have questions or concerns about your Care? No.  Actions: * If pain score is 4 or above: No action needed, pain <4.  Have you developed a fever since your procedure? no  2.   Have you had an respiratory symptoms (SOB or cough) since your procedure? no  3.   Have you tested positive for COVID 19 since your procedure no  4.   Have you had any family members/close contacts diagnosed with the COVID 19 since your procedure?  no   If yes to any of these questions please route to Joylene John, RN and Joella Prince, RN

## 2021-03-08 DIAGNOSIS — R296 Repeated falls: Secondary | ICD-10-CM | POA: Diagnosis not present

## 2021-03-08 DIAGNOSIS — R260 Ataxic gait: Secondary | ICD-10-CM | POA: Diagnosis not present

## 2021-03-08 DIAGNOSIS — G35 Multiple sclerosis: Secondary | ICD-10-CM | POA: Diagnosis not present

## 2021-03-08 DIAGNOSIS — S42211D Unspecified displaced fracture of surgical neck of right humerus, subsequent encounter for fracture with routine healing: Secondary | ICD-10-CM | POA: Diagnosis not present

## 2021-03-12 DIAGNOSIS — G35 Multiple sclerosis: Secondary | ICD-10-CM | POA: Diagnosis not present

## 2021-03-12 DIAGNOSIS — R296 Repeated falls: Secondary | ICD-10-CM | POA: Diagnosis not present

## 2021-03-12 DIAGNOSIS — S42211D Unspecified displaced fracture of surgical neck of right humerus, subsequent encounter for fracture with routine healing: Secondary | ICD-10-CM | POA: Diagnosis not present

## 2021-03-12 DIAGNOSIS — R260 Ataxic gait: Secondary | ICD-10-CM | POA: Diagnosis not present

## 2021-03-21 ENCOUNTER — Ambulatory Visit: Payer: Medicare Other | Admitting: Internal Medicine

## 2021-03-26 ENCOUNTER — Encounter: Payer: Self-pay | Admitting: Gastroenterology

## 2021-04-09 DIAGNOSIS — G35 Multiple sclerosis: Secondary | ICD-10-CM | POA: Diagnosis not present

## 2021-04-09 DIAGNOSIS — H35373 Puckering of macula, bilateral: Secondary | ICD-10-CM | POA: Diagnosis not present

## 2021-04-16 DIAGNOSIS — D225 Melanocytic nevi of trunk: Secondary | ICD-10-CM | POA: Diagnosis not present

## 2021-04-16 DIAGNOSIS — L570 Actinic keratosis: Secondary | ICD-10-CM | POA: Diagnosis not present

## 2021-04-16 DIAGNOSIS — D485 Neoplasm of uncertain behavior of skin: Secondary | ICD-10-CM | POA: Diagnosis not present

## 2021-04-16 DIAGNOSIS — C44619 Basal cell carcinoma of skin of left upper limb, including shoulder: Secondary | ICD-10-CM | POA: Diagnosis not present

## 2021-04-16 DIAGNOSIS — L814 Other melanin hyperpigmentation: Secondary | ICD-10-CM | POA: Diagnosis not present

## 2021-04-16 DIAGNOSIS — D2272 Melanocytic nevi of left lower limb, including hip: Secondary | ICD-10-CM | POA: Diagnosis not present

## 2021-05-17 ENCOUNTER — Other Ambulatory Visit: Payer: Self-pay | Admitting: Internal Medicine

## 2021-05-17 DIAGNOSIS — J418 Mixed simple and mucopurulent chronic bronchitis: Secondary | ICD-10-CM

## 2021-05-17 DIAGNOSIS — J45991 Cough variant asthma: Secondary | ICD-10-CM

## 2021-05-21 ENCOUNTER — Encounter: Payer: Self-pay | Admitting: Internal Medicine

## 2021-05-21 ENCOUNTER — Ambulatory Visit (INDEPENDENT_AMBULATORY_CARE_PROVIDER_SITE_OTHER): Payer: Medicare Other | Admitting: Internal Medicine

## 2021-05-21 ENCOUNTER — Other Ambulatory Visit: Payer: Self-pay

## 2021-05-21 VITALS — BP 138/82 | HR 90 | Temp 97.8°F | Resp 20 | Ht 62.0 in | Wt 114.0 lb

## 2021-05-21 DIAGNOSIS — J418 Mixed simple and mucopurulent chronic bronchitis: Secondary | ICD-10-CM

## 2021-05-21 DIAGNOSIS — R8290 Unspecified abnormal findings in urine: Secondary | ICD-10-CM | POA: Insufficient documentation

## 2021-05-21 DIAGNOSIS — N390 Urinary tract infection, site not specified: Secondary | ICD-10-CM | POA: Diagnosis not present

## 2021-05-21 DIAGNOSIS — D508 Other iron deficiency anemias: Secondary | ICD-10-CM | POA: Diagnosis not present

## 2021-05-21 DIAGNOSIS — I75023 Atheroembolism of bilateral lower extremities: Secondary | ICD-10-CM | POA: Diagnosis not present

## 2021-05-21 DIAGNOSIS — B9620 Unspecified Escherichia coli [E. coli] as the cause of diseases classified elsewhere: Secondary | ICD-10-CM

## 2021-05-21 DIAGNOSIS — K5904 Chronic idiopathic constipation: Secondary | ICD-10-CM

## 2021-05-21 DIAGNOSIS — E038 Other specified hypothyroidism: Secondary | ICD-10-CM | POA: Diagnosis not present

## 2021-05-21 LAB — URINALYSIS, ROUTINE W REFLEX MICROSCOPIC
Bilirubin Urine: NEGATIVE
Hgb urine dipstick: NEGATIVE
Ketones, ur: NEGATIVE
Nitrite: NEGATIVE
RBC / HPF: NONE SEEN (ref 0–?)
Specific Gravity, Urine: 1.01 (ref 1.000–1.030)
Total Protein, Urine: NEGATIVE
Urine Glucose: NEGATIVE
Urobilinogen, UA: 0.2 (ref 0.0–1.0)
pH: 7 (ref 5.0–8.0)

## 2021-05-21 LAB — CBC WITH DIFFERENTIAL/PLATELET
Basophils Absolute: 0 10*3/uL (ref 0.0–0.1)
Basophils Relative: 0.7 % (ref 0.0–3.0)
Eosinophils Absolute: 0.1 10*3/uL (ref 0.0–0.7)
Eosinophils Relative: 3.1 % (ref 0.0–5.0)
HCT: 38.3 % (ref 36.0–46.0)
Hemoglobin: 12.5 g/dL (ref 12.0–15.0)
Lymphocytes Relative: 31.9 % (ref 12.0–46.0)
Lymphs Abs: 1.1 10*3/uL (ref 0.7–4.0)
MCHC: 32.7 g/dL (ref 30.0–36.0)
MCV: 89.4 fl (ref 78.0–100.0)
Monocytes Absolute: 0.5 10*3/uL (ref 0.1–1.0)
Monocytes Relative: 12.8 % — ABNORMAL HIGH (ref 3.0–12.0)
Neutro Abs: 1.8 10*3/uL (ref 1.4–7.7)
Neutrophils Relative %: 51.5 % (ref 43.0–77.0)
Platelets: 229 10*3/uL (ref 150.0–400.0)
RBC: 4.28 Mil/uL (ref 3.87–5.11)
RDW: 16.6 % — ABNORMAL HIGH (ref 11.5–15.5)
WBC: 3.6 10*3/uL — ABNORMAL LOW (ref 4.0–10.5)

## 2021-05-21 LAB — IBC + FERRITIN
Ferritin: 30.2 ng/mL (ref 10.0–291.0)
Iron: 108 ug/dL (ref 42–145)
Saturation Ratios: 29.1 % (ref 20.0–50.0)
TIBC: 371 ug/dL (ref 250.0–450.0)
Transferrin: 265 mg/dL (ref 212.0–360.0)

## 2021-05-21 LAB — BASIC METABOLIC PANEL
BUN: 16 mg/dL (ref 6–23)
CO2: 26 mEq/L (ref 19–32)
Calcium: 9.6 mg/dL (ref 8.4–10.5)
Chloride: 98 mEq/L (ref 96–112)
Creatinine, Ser: 0.8 mg/dL (ref 0.40–1.20)
GFR: 75.49 mL/min (ref >60.00)
Glucose, Bld: 95 mg/dL (ref 70–99)
Potassium: 3.9 mEq/L (ref 3.5–5.1)
Sodium: 135 mEq/L (ref 135–145)

## 2021-05-21 LAB — TSH: TSH: 3.42 u[IU]/mL (ref 0.35–5.50)

## 2021-05-21 MED ORDER — ACCRUFER 30 MG PO CAPS: 1.0000 | ORAL_CAPSULE | Freq: Two times a day (BID) | ORAL | 0 refills | Status: DC

## 2021-05-21 NOTE — Progress Notes (Signed)
Subjective:  Patient ID: Kayla [name], female    DOB: 10-19-1950  Age: 71 y.o. MRN: [id number]  CC: Anemia and COPD  This visit occurred during the SARS-CoV-2 public health emergency.  Safety protocols were in place, including screening questions prior to the visit, additional usage of staff PPE, and extensive cleaning of exam room while observing appropriate contact time as indicated for disinfecting solutions.    HPI Kayla [name] presents for f/up -     She complains of a 3 day hx of dysuria and malodorous urine.   Outpatient Medications Prior to Visit  Medication Sig Dispense Refill   albuterol (VENTOLIN HFA) 108 (90 Base) MCG/ACT inhaler INHALE 2 PUFFS INTO LUNGS EVERY 6 HOURS AS NEEDED. 18 g 0   amphetamine-dextroamphetamine (ADDERALL) 20 MG tablet Take 20 mg by mouth daily.     baclofen (LIORESAL) 20 MG tablet Take 20 mg by mouth 3 (three) times daily as needed for muscle spasms.     CALCIUM PO Take 1 tablet by mouth daily.     dalfampridine 10 MG TB12 Take 10 mg by mouth 2 (two) times daily.     dexlansoprazole (DEXILANT) 60 MG capsule TAKE 1 CAPSULE BY MOUTH  ONCE DAILY WITH BREAKFAST 90 capsule 3   famotidine (PEPCID) 20 MG tablet One after supper 30 tablet 11   FLUoxetine (PROZAC) 40 MG capsule Take 40 mg by mouth daily.     furosemide (LASIX) 20 MG tablet TAKE 1 TABLET BY MOUTH  DAILY 90 tablet 3   gabapentin (NEURONTIN) 600 MG tablet Take 1 tablet (600 mg total) by mouth 3 (three) times daily. 90 tablet 2   Immune Globulin, Human,-klhw (XEMBIFY [place]) Inject into the skin.     levothyroxine (SYNTHROID) 100 MCG tablet TAKE 1 TABLET BY MOUTH ONCE DAILY BEFORE BREAKFAST 90 tablet 3   Multiple Vitamin (MULTIVITAMIN WITH MINERALS) TABS tablet Take 1 tablet by mouth daily.     MYRBETRIQ 25 MG TB24 tablet Take 25 mg by mouth daily.     pilocarpine (SALAGEN) 7.5 MG tablet Take 7.5 mg by mouth 2 (two) times daily.     Probiotic Product (PROBIOTIC PO) Take 1 capsule by mouth  daily. Harmony probiotic     Respiratory Therapy Supplies (FLUTTER) DEVI Use as directed 1 each 0   tamsulosin (FLOMAX) 0.4 MG CAPS capsule Take 0.4 mg by mouth daily.     temazepam (RESTORIL) 15 MG capsule Take 15 mg by mouth at bedtime.     TRELEGY ELLIPTA 100-62.5-25 MCG/ACT AEPB Inhale 1 puff into the lungs daily. 120 each 1   Ferric Maltol (ACCRUFER) 30 MG CAPS Take 1 capsule by mouth in the morning and at bedtime. 180 capsule 0   benzonatate (TESSALON) 200 MG capsule Take 1 capsule (200 mg total) by mouth 2 (two) times daily as needed for cough. 60 capsule 1   No facility-administered medications prior to visit.    ROS Review of Systems  Constitutional:  Negative for chills, diaphoresis, fatigue and fever.  HENT: Negative.    Eyes: Negative.   Respiratory:  Positive for shortness of breath. Negative for cough, chest tightness and wheezing.   Cardiovascular:  Negative for chest pain, palpitations and leg swelling.  Gastrointestinal:  Positive for constipation. Negative for abdominal pain, diarrhea, nausea and vomiting.  Endocrine: Negative.   Genitourinary:  Positive for dysuria. Negative for decreased urine volume, difficulty urinating, frequency, hematuria, pelvic pain and urgency.  Musculoskeletal: Negative.  Negative for arthralgias.  Skin: Negative.   Neurological:  Negative for dizziness, weakness and light-headedness.  Hematological:  Negative for adenopathy. Does not bruise/bleed easily.  Psychiatric/Behavioral: Negative.     Objective:  BP 138/82 (BP Location: Right Arm, Patient Position: Sitting, Cuff Size: Normal)    Pulse 90    Temp 97.8 F (36.6 C) (Oral)    Resp 20    Ht 5\' 2"  (1.575 m)    Wt 114 lb (51.7 kg)    SpO2 93%    BMI 20.85 kg/m   BP Readings from Last 3 Encounters:  05/21/21 138/82  03/05/21 119/77  02/18/21 104/68    Wt Readings from Last 3 Encounters:  05/21/21 114 lb (51.7 kg)  03/05/21 118 lb (53.5 kg)  02/18/21 115 lb (52.2 kg)     Physical Exam Vitals reviewed.  Constitutional:      General: She is not in acute distress.    Appearance: She is ill-appearing. She is not toxic-appearing.  HENT:     Nose: Nose normal.  Eyes:     General: No scleral icterus.    Conjunctiva/sclera: Conjunctivae normal.  Cardiovascular:     Rate and Rhythm: Normal rate and regular rhythm.     Heart sounds: No murmur heard. Pulmonary:     Effort: Pulmonary effort is normal. No tachypnea, accessory muscle usage or respiratory distress.     Breath sounds: Examination of the right-middle field reveals rhonchi. Examination of the left-middle field reveals rhonchi. Examination of the right-lower field reveals rhonchi. Examination of the left-lower field reveals rhonchi. Rhonchi present. No decreased breath sounds, wheezing or rales.  Abdominal:     General: Abdomen is flat.     Palpations: There is no mass.     Tenderness: There is no abdominal tenderness. There is no guarding.     Hernia: No hernia is present.  Musculoskeletal:        General: Normal range of motion.     Cervical back: Neck supple.     Right lower leg: No edema.     Left lower leg: No edema.  Lymphadenopathy:     Cervical: No cervical adenopathy.  Skin:    General: Skin is warm and dry.  Neurological:     General: No focal deficit present.  Psychiatric:        Mood and Affect: Mood normal.    Lab Results  Component Value Date   WBC 3.6 (L) 05/21/2021   HGB 12.5 05/21/2021   HCT 38.3 05/21/2021   PLT 229.0 05/21/2021   GLUCOSE 95 05/21/2021   CHOL 262 (H) 07/26/2020   TRIG 55.0 07/26/2020   HDL 129.90 07/26/2020   LDLDIRECT 116.1 06/25/2010   LDLCALC 121 (H) 07/26/2020   ALT 15 10/23/2020   AST 20 10/23/2020   NA 135 05/21/2021   K 3.9 05/21/2021   CL 98 05/21/2021   CREATININE 0.80 05/21/2021   BUN 16 05/21/2021   CO2 26 05/21/2021   TSH 3.42 05/21/2021   INR 1.0 10/25/2020   HGBA1C 5.6 02/25/2013    DG Chest 2 View  Result Date:  11/09/2020 CLINICAL DATA:  Pneumonia. EXAM: CHEST - 2 VIEW COMPARISON:  October 26, 2020. FINDINGS: The heart size and mediastinal contours are within normal limits. Right lung is clear. Significantly decreased left upper and lower lobe airspace opacities are noted consistent with improving pneumonia. The visualized skeletal structures are unremarkable. IMPRESSION: Significantly decreased left upper and lower lobe opacities are noted consistent with improving pneumonia, although continued radiographic  follow-up is recommended to ensure complete resolution. Electronically Signed   By: Marijo Conception M.D.   On: 11/09/2020 08:43    Assessment & Plan:   Kayla [name] was seen today for anemia and copd.  Diagnoses and all orders for this visit:  Purple toe syndrome of both feet ([place])- Stable. No ischemia.  Iron deficiency anemia secondary to inadequate dietary iron intake- H/H and iron are normal now. -     Ferric Maltol (ACCRUFER) 30 MG CAPS; Take 1 capsule by mouth in the morning and at bedtime. -     CBC with Differential/Platelet; Future -     IBC + Ferritin; Future -     IBC + Ferritin -     CBC with Differential/Platelet  Mixed simple and mucopurulent chronic bronchitis (HCC)  Hypothyroidism- Her TSH is on the normal range. -     TSH; Future -     TSH  Chronic idiopathic constipation- She will try miralax. -     Basic metabolic panel; Future -     TSH; Future -     TSH -     Basic metabolic panel  Malodorous urine -     Urinalysis, Routine w reflex microscopic; Future -     CULTURE, URINE COMPREHENSIVE; Future -     CULTURE, URINE COMPREHENSIVE -     Urinalysis, Routine w reflex microscopic  E. coli UTI -     sulfamethoxazole-trimethoprim (BACTRIM DS) 800-160 MG tablet; Take 1 tablet by mouth 2 (two) times daily for 5 days.   I have discontinued [place] H. Kassa's benzonatate. I am also having her start on sulfamethoxazole-trimethoprim. Additionally, I am having her maintain her  FLUoxetine, baclofen, amphetamine-dextroamphetamine, multivitamin with minerals, temazepam, Flutter, Myrbetriq, dalfampridine, furosemide, famotidine, tamsulosin, gabapentin, pilocarpine, Probiotic Product (PROBIOTIC PO), CALCIUM PO, albuterol, (Immune Globulin, Human,-klhw (XEMBIFY [place])), dexlansoprazole, levothyroxine, Trelegy Ellipta, and ACCRUFeR.  Meds ordered this encounter  Medications   Ferric Maltol (ACCRUFER) 30 MG CAPS    Sig: Take 1 capsule by mouth in the morning and at bedtime.    Dispense:  180 capsule    Refill:  0   sulfamethoxazole-trimethoprim (BACTRIM DS) 800-160 MG tablet    Sig: Take 1 tablet by mouth 2 (two) times daily for 5 days.    Dispense:  10 tablet    Refill:  0     Follow-up: Return in about 6 months (around 11/18/2021).  Scarlette Calico, MD

## 2021-05-21 NOTE — Patient Instructions (Signed)

## 2021-05-24 DIAGNOSIS — N390 Urinary tract infection, site not specified: Secondary | ICD-10-CM | POA: Insufficient documentation

## 2021-05-24 DIAGNOSIS — C44619 Basal cell carcinoma of skin of left upper limb, including shoulder: Secondary | ICD-10-CM | POA: Diagnosis not present

## 2021-05-24 DIAGNOSIS — D485 Neoplasm of uncertain behavior of skin: Secondary | ICD-10-CM | POA: Diagnosis not present

## 2021-05-24 DIAGNOSIS — L988 Other specified disorders of the skin and subcutaneous tissue: Secondary | ICD-10-CM | POA: Diagnosis not present

## 2021-05-24 DIAGNOSIS — B9620 Unspecified Escherichia coli [E. coli] as the cause of diseases classified elsewhere: Secondary | ICD-10-CM | POA: Insufficient documentation

## 2021-05-24 LAB — CULTURE, URINE COMPREHENSIVE

## 2021-05-24 MED ORDER — SULFAMETHOXAZOLE-TRIMETHOPRIM 800-160 MG PO TABS: 1.0000 | ORAL_TABLET | Freq: Two times a day (BID) | ORAL | 0 refills | Status: AC

## 2021-05-28 ENCOUNTER — Other Ambulatory Visit: Payer: Self-pay | Admitting: Internal Medicine

## 2021-06-03 DIAGNOSIS — Z96641 Presence of right artificial hip joint: Secondary | ICD-10-CM | POA: Diagnosis not present

## 2021-06-24 ENCOUNTER — Encounter: Payer: Self-pay | Admitting: Internal Medicine

## 2021-06-25 ENCOUNTER — Other Ambulatory Visit: Payer: Self-pay | Admitting: Internal Medicine

## 2021-06-25 ENCOUNTER — Encounter: Payer: Self-pay | Admitting: Internal Medicine

## 2021-06-25 ENCOUNTER — Ambulatory Visit (INDEPENDENT_AMBULATORY_CARE_PROVIDER_SITE_OTHER): Payer: Medicare Other

## 2021-06-25 DIAGNOSIS — R051 Acute cough: Secondary | ICD-10-CM

## 2021-06-25 DIAGNOSIS — R053 Chronic cough: Secondary | ICD-10-CM | POA: Insufficient documentation

## 2021-06-25 DIAGNOSIS — R059 Cough, unspecified: Secondary | ICD-10-CM | POA: Diagnosis not present

## 2021-06-26 ENCOUNTER — Other Ambulatory Visit (HOSPITAL_BASED_OUTPATIENT_CLINIC_OR_DEPARTMENT_OTHER): Payer: Self-pay

## 2021-06-26 ENCOUNTER — Other Ambulatory Visit: Payer: Self-pay | Admitting: Internal Medicine

## 2021-06-26 ENCOUNTER — Encounter: Payer: Self-pay | Admitting: Internal Medicine

## 2021-06-26 DIAGNOSIS — D849 Immunodeficiency, unspecified: Secondary | ICD-10-CM | POA: Diagnosis not present

## 2021-06-26 DIAGNOSIS — R5382 Chronic fatigue, unspecified: Secondary | ICD-10-CM

## 2021-06-26 DIAGNOSIS — H04123 Dry eye syndrome of bilateral lacrimal glands: Secondary | ICD-10-CM | POA: Diagnosis not present

## 2021-06-26 DIAGNOSIS — G35 Multiple sclerosis: Secondary | ICD-10-CM | POA: Diagnosis not present

## 2021-06-26 DIAGNOSIS — R682 Dry mouth, unspecified: Secondary | ICD-10-CM | POA: Diagnosis not present

## 2021-06-26 MED ORDER — AMPHETAMINE-DEXTROAMPHETAMINE 20 MG PO TABS
20.0000 mg | ORAL_TABLET | Freq: Every day | ORAL | 0 refills | Status: DC
  Filled 2021-06-26: qty 30, 30d supply, fill #0

## 2021-06-26 MED ORDER — AMPHETAMINE-DEXTROAMPHETAMINE 20 MG PO TABS: 20.0000 mg | ORAL_TABLET | Freq: Every day | ORAL | 0 refills | Status: DC

## 2021-07-07 ENCOUNTER — Encounter: Payer: Self-pay | Admitting: Internal Medicine

## 2021-07-10 ENCOUNTER — Encounter: Payer: Self-pay | Admitting: Family Medicine

## 2021-07-10 ENCOUNTER — Ambulatory Visit (INDEPENDENT_AMBULATORY_CARE_PROVIDER_SITE_OTHER): Payer: Medicare Other | Admitting: Family Medicine

## 2021-07-10 VITALS — BP 132/82 | HR 82 | Temp 97.2°F | Ht 62.0 in | Wt 116.8 lb

## 2021-07-10 DIAGNOSIS — Z8709 Personal history of other diseases of the respiratory system: Secondary | ICD-10-CM | POA: Diagnosis not present

## 2021-07-10 DIAGNOSIS — R053 Chronic cough: Secondary | ICD-10-CM

## 2021-07-10 DIAGNOSIS — R8290 Unspecified abnormal findings in urine: Secondary | ICD-10-CM

## 2021-07-10 DIAGNOSIS — J418 Mixed simple and mucopurulent chronic bronchitis: Secondary | ICD-10-CM | POA: Diagnosis not present

## 2021-07-10 DIAGNOSIS — J45991 Cough variant asthma: Secondary | ICD-10-CM

## 2021-07-10 DIAGNOSIS — I75023 Atheroembolism of bilateral lower extremities: Secondary | ICD-10-CM | POA: Diagnosis not present

## 2021-07-10 LAB — POCT URINALYSIS DIPSTICK
Bilirubin, UA: NEGATIVE
Glucose, UA: POSITIVE — AB
Ketones, UA: NEGATIVE
Nitrite, UA: NEGATIVE
Protein, UA: NEGATIVE
Spec Grav, UA: 1.015 (ref 1.010–1.025)
Urobilinogen, UA: 0.2 E.U./dL
pH, UA: 6.5 (ref 5.0–8.0)

## 2021-07-10 LAB — POCT RAPID STREP A (OFFICE): Rapid Strep A Screen: NEGATIVE

## 2021-07-10 MED ORDER — BENZONATATE 200 MG PO CAPS: 200.0000 mg | ORAL_CAPSULE | Freq: Two times a day (BID) | ORAL | 0 refills | Status: DC | PRN

## 2021-07-10 NOTE — Progress Notes (Signed)
? ?Subjective:  ? ? Patient ID: Kayla [name], female    DOB: 08-Sep-1950, 71 y.o.   MRN: [id number] ? ?Cough ? ?Urinary Tract Infection  ? ?Chief Complaint  ?Patient presents with  ? Cough  ?  x1 year has been effecting her sleep, brown mucus.   ? Urinary Tract Infection  ?  Believes she may have uti, cloudy urine and has an odor  ? ?Here with complaints of cough x 1 year. She occasionally coughs up thick, brown mucus. Cough is intermittent worse at times. States she coughed a lot last night.  ? ?She is using Trelegy. Has not needed albuterol. Denies shortness of breath that she needs her inhaler.  ? ?Hx of pneumonia and COPD. Does not smoke. ? ?[facility] is treating her with IVIG for immune system. She also has MS.  ? ?She has seen Dr. Melvyn Novas in the distant past but not since March 2022. She saw the PA Tammy in August 2022.  ? ?Complains of cloudy urine with a bad odor x several weeks. States her urine has never cleared up since being treated for E. Coli in February.  ?No changes in frequency, urgency or dysuria. She does self cath.  ? ?Chest XR neg for pneumonia on 06/25/2021.  ? ?Requests to be checked for strep pharyngitis. States she has had strep several times in the past without symptoms. Denies sore throat.  ? ?Denies fever, chills, dizziness, chest pain, palpitations, shortness of breath, abdominal pain, N/V/D, LE edema.  ? ?Reviewed allergies, medications, past medical, surgical, family, and social history. ? ? ? ?Review of Systems  ?Respiratory:  Positive for cough.   ?Pertinent positives and negatives in the history of present illness. ? ?   ?Objective:  ? Physical Exam ?Constitutional:   ?   General: She is not in acute distress. ?   Appearance: She is not ill-appearing.  ?HENT:  ?   Mouth/Throat:  ?   Mouth: Mucous membranes are moist.  ?   Pharynx: Posterior oropharyngeal erythema present. No oropharyngeal exudate.  ?Eyes:  ?   Conjunctiva/sclera: Conjunctivae normal.  ?Cardiovascular:  ?    Rate and Rhythm: Normal rate and regular rhythm.  ?Pulmonary:  ?   Effort: Pulmonary effort is normal.  ?   Breath sounds: Wheezing present.  ?Abdominal:  ?   General: Abdomen is flat.  ?Musculoskeletal:  ?   Cervical back: Normal range of motion and neck supple.  ?Skin: ?   General: Skin is warm and dry.  ?Neurological:  ?   Mental Status: She is alert and oriented to person, place, and time. Mental status is at baseline.  ?Psychiatric:     ?   Mood and Affect: Mood normal.  ? ?BP 132/82 (BP Location: Left Arm, Patient Position: Sitting, Cuff Size: Normal)   Pulse 82   Temp (!) 97.2 ?F (36.2 ?C) (Temporal)   Ht '5\' 2"'$  (1.575 m)   Wt 116 lb 12.8 oz (53 kg)   SpO2 98%   BMI 21.36 kg/m?  ? ? ?   ?Assessment & Plan:  ?Persistent cough - Plan: benzonatate (TESSALON) 200 MG capsule ?-Denies any significant changes to her cough.  No sign of acute infection.  Tessalon prescribed.  She does have a [facility]ologist at [facility] and will call to schedule a follow-up.  She is also seeing an Recruitment consultant in [place].  Reviewed recent chest x-ray (2 weeks ago) which was negative for pneumonia and denies any  new symptoms since then.  Continue Trelegy inhaler and use albuterol as needed. ? ?Mixed simple and mucopurulent chronic bronchitis ([place]) ?-Follow-up with pulmonology and immunologist ? ?Cough variant asthma vs vcd  related to MS  ?-Follow-up with pulmonologist and immunologist ? ?Cloudy urine ?-UA shows leukocytes.  I will send it for culture. ? ?History of strep pharyngitis ?-Per patient request a swab was performed in office.  Results negative. ? ? ? ?

## 2021-07-10 NOTE — Patient Instructions (Addendum)
I prescribed Tessalon cough capsules for you to take at home.  ? ?Continue with your current medications.  ? ?I recommend scheduling with [facility] as discussed.  ? ?Your strep test is negative.  ? ?We will be in touch with your urine culture results.  ?

## 2021-07-10 NOTE — Telephone Encounter (Signed)
Called and spoke with pt about mychart message. Pt wanted to see TP only so appt has been scheduled for pt with TP 4/27. Nothing further needed. ?

## 2021-07-13 ENCOUNTER — Other Ambulatory Visit: Payer: Self-pay | Admitting: Family Medicine

## 2021-07-13 DIAGNOSIS — N3000 Acute cystitis without hematuria: Secondary | ICD-10-CM

## 2021-07-13 LAB — URINE CULTURE

## 2021-07-13 MED ORDER — SULFAMETHOXAZOLE-TRIMETHOPRIM 800-160 MG PO TABS: 1.0000 | ORAL_TABLET | Freq: Two times a day (BID) | ORAL | 0 refills | Status: DC

## 2021-07-18 ENCOUNTER — Encounter: Payer: Self-pay | Admitting: Adult Health

## 2021-07-18 ENCOUNTER — Telehealth: Payer: Self-pay | Admitting: Adult Health

## 2021-07-18 ENCOUNTER — Ambulatory Visit (INDEPENDENT_AMBULATORY_CARE_PROVIDER_SITE_OTHER): Payer: Medicare Other | Admitting: Adult Health

## 2021-07-18 VITALS — BP 116/60 | HR 76 | Temp 98.4°F | Ht 63.0 in | Wt 114.8 lb

## 2021-07-18 DIAGNOSIS — J189 Pneumonia, unspecified organism: Secondary | ICD-10-CM

## 2021-07-18 DIAGNOSIS — G35 Multiple sclerosis: Secondary | ICD-10-CM | POA: Diagnosis not present

## 2021-07-18 DIAGNOSIS — J45909 Unspecified asthma, uncomplicated: Secondary | ICD-10-CM | POA: Diagnosis not present

## 2021-07-18 DIAGNOSIS — G35D Multiple sclerosis, unspecified: Secondary | ICD-10-CM

## 2021-07-18 DIAGNOSIS — I75023 Atheroembolism of bilateral lower extremities: Secondary | ICD-10-CM | POA: Diagnosis not present

## 2021-07-18 DIAGNOSIS — R9389 Abnormal findings on diagnostic imaging of other specified body structures: Secondary | ICD-10-CM | POA: Diagnosis not present

## 2021-07-18 NOTE — Assessment & Plan Note (Addendum)
Asthma-patient has multiple triggers including GERD, vocal cord dysfunction/paresis ?Continue on Trelegy.  Oral inhaler care discussed in detail. ?Due to her cough.  She would be unable to do spirometry. ? ?Continue on current regimen-control for triggers ? ?Plan  ?Patient Instructions  ?Robitussin DM 2 tsp every 4hrs for cough As needed   ?Tessalon Three times a day  for cough as needed  ?Flutter valve Three times a day   ?Zyrtec '10mg'$  At bedtime  As needed  drainage.  ?Continue on TRELEGY 1 puff daily .  ?CT chest w/o contrast -recurrent pneumonia  ?Sputum culture , sputum AFB and Sputum fungal .  ?Follow up in 4 weeks and As needed   ?Please contact office for sooner follow up if symptoms do not improve or worsen or seek emergency care  ? ? ?  ? ? ?

## 2021-07-18 NOTE — Telephone Encounter (Signed)
Fine with me

## 2021-07-18 NOTE — Assessment & Plan Note (Signed)
Abnormal CT chest with waxing and waning groundglass opacities.  Patient with underlying recurrent pneumonia and immunocompromise state with underlying MS.  Patient has been off of [place] for almost 1 year. ?She is now following with immunology for immunodeficiency.  She is getting weekly IVIG. ?We will repeat CT chest for closer evaluation of lung parenchyma. ?Check sputum culture, sputum AFB sputum fungal. ? ?Plan  ?Patient Instructions  ?Robitussin DM 2 tsp every 4hrs for cough As needed   ?Tessalon Three times a day  for cough as needed  ?Flutter valve Three times a day   ?Zyrtec '10mg'$  At bedtime  As needed  drainage.  ?Continue on TRELEGY 1 puff daily .  ?CT chest w/o contrast -recurrent pneumonia  ?Sputum culture , sputum AFB and Sputum fungal .  ?Follow up in 4 weeks and As needed   ?Please contact office for sooner follow up if symptoms do not improve or worsen or seek emergency care  ? ? ?  ? ? ?

## 2021-07-18 NOTE — Assessment & Plan Note (Signed)
Continue follow-up with neurology. 

## 2021-07-18 NOTE — Progress Notes (Signed)
? ?_0  ID: Kayla [name], female    DOB: Apr 18, 1950, 71 y.o.   MRN: [id number] ? ?Chief Complaint  ?Patient presents with  ? Follow-up  ? ? ?Referring provider: ?Janith Lima, MD ? ?HPI: ?71 year old female never smoker followed for asthma ?Medical history significant for MS and bilateral vocal cord paralysis ? ?TEST/EVENTS :  ?CT chest September 27, 2020 showed groundglass opacities in the right lower lobe and left lower lobe concerning for multifocal pneumonia.  14 x 10 mm nodular density in the right middle lobe ?Modified barium swallow was negative ? ?CT chest on August 3 showed groundglass attenuation consolidation with waxing and waning groundglass attenuation.  Autoimmune work-up was negative.  Fungal cultures were negative.  AFB sputum was negative.  BAL cultures showed rare Streptococcus Constellatus.  (Sensitive to ceftriaxone, levofloxacin and vancomycin.   intermediate to penicillin).  Sed rate was elevated at 80. ?Cytology showed reactive bronchial cells.  Benign lung tissue. ? ?  CT chest September 27 2020 showed groundglass opacities in the right lower lobe and left lower lobe concerning for multifocal pneumonia.  An 14 mm nodular density in the right middle lobe ? ?August 2022.  CT chest showed groundglass attenuation and waxing waning groundglass.  Autoimmune work-up was negative.  Fungal cultures AFB sputum was negative.  BAL cultures showed rare Streptococcus  Constellatus, sed rate was 80.  Cytology showed reactive bronchial cells and benign lung tissue.  Patient was referred to immunology ? ?07/18/2021 Follow up: Asthma and Pneumonia  ?Returns for 6 month follow up . Over last year has been have recurrent bronchitis and pneumonia.  Patient was admitted in July for community-acquired pneumonia.  Was treated with IV antibiotics.  CT chest September 27, 2020 showed groundglass opacities on the right and left.  Patient continued to have ongoing symptoms was readmitted in August 2022.  CT chest showed  groundglass attenuation and waxing and waning groundglass.  Autoimmune work-up was negative.  Fungal cultures AFB was negative BAL cultures were positive for rare Streptococcus constellatus.  Her sed rate was 80 cytology showed reactive bronchial cells and benign lung tissue.  Patient was referred to immunology.  Her Ocrevus was stopped due to recurrent infections.  She was treated with Rocephin for 8 weeks. ?Following with Immunology on weekly IVIG Xembify  . Has been off MS meds since last year due to fear that it caused her recurrent infections.  Patient says that she has had a slight occasional improvement in cough but continues to have a daily cough intermittent congestion fatigue low energy and decreased activity tolerance. ?Her neurologist recently passed away.  She is in the process of establishing with a new neurologist for MS ?Primary care provider changed her Dulera to Trelegy.  She says she does feel like it has helped some.  She had a chest x-ray done June 25, 2021 that showed no evidence of lobar pneumonia.  Lower lobe interstitial thickening minimally improved. ? ?Patient denies any fever, hemoptysis, chest pain, orthopnea.  She uses Robitussin and Tessalon for cough control.  Uses a flutter valve 2-3 times a day. ? ?Patient says she now urinary self caths due to urinary retention. ? ? ?Allergies  ?Allergen Reactions  ? Tramadol   ?  hallucinations   ? Contrast Media [Iodinated Contrast Media] Rash  ? Iohexol Hives  ? Morphine And Related Other (See Comments)  ?  Reaction:  Hallucinations   ? Methylprednisolone Other (See Comments)  ?  Reaction:  Decreases pts heart  rate   ? Oxycodone-Acetaminophen Rash and Other (See Comments)  ?  Reaction:  Hallucinations   ? Phenytoin Sodium Extended Rash  ? Zanaflex [Tizanidine Hydrochloride] Rash  ? ? ?Immunization History  ?Administered Date(s) Administered  ? DTP 04/10/2009  ? Fluad Quad(high Dose 65+) 12/29/2018  ? Influenza Split 12/10/2010, 02/11/2012,  12/22/2012, 01/02/2014, 12/22/2014  ? Influenza Whole 02/19/2005, 03/28/2009, 01/23/2016  ? Influenza, High Dose Seasonal PF 02/23/2018  ? Influenza,inj,Quad PF,6+ Mos 02/10/2017  ? Influenza-Unspecified 12/02/2018, 01/08/2019, 12/22/2019, 01/15/2021, 02/05/2021  ? Moderna Covid-19 Vaccine Bivalent Booster 61yr & up 02/05/2021  ? Moderna Sars-Covid-2 Vaccination 04/29/2019, 06/01/2019, 01/19/2020, 08/14/2020  ? Pneumococcal Conjugate-13 11/06/2015  ? Pneumococcal Polysaccharide-23 03/28/2009, 04/26/2019  ? Tdap 04/26/2019  ? Zoster Recombinat (Shingrix) 09/07/2019, 02/17/2020  ? ? ?Past Medical History:  ?Diagnosis Date  ? Arthritis   ? oa  ? Cervical cancer ([facility] 2005  ? COPD (chronic obstructive pulmonary disease) ([facility]   ? Multiple sclerosis ([facility] [place]   ? Multiple sclerosis ([facility] 1996  ? Neuromuscular disorder ([facility]   ? Osteoporosis   ? Other diseases of vocal cords   ? vocal cords are not even  ? Other voice and resonance disorders   ? Pneumonia 2014, 1992  ? Sleep apnea   ? no cpap used  ? Unspecified hypothyroidism   ? ? ?Tobacco History: ?Social History  ? ?Tobacco Use  ?Smoking Status Never  ?Smokeless Tobacco Never  ? ?Counseling given: Not Answered ? ? ?Outpatient Medications Prior to Visit  ?Medication Sig Dispense Refill  ? albuterol (VENTOLIN HFA) 108 (90 Base) MCG/ACT inhaler INHALE 2 PUFFS INTO LUNGS EVERY 6 HOURS AS NEEDED. 18 g 0  ? amphetamine-dextroamphetamine (ADDERALL) 20 MG tablet Take 1 tablet (20 mg total) by mouth daily. 30 tablet 0  ? baclofen (LIORESAL) 20 MG tablet Take 20 mg by mouth 3 (three) times daily as needed for muscle spasms.    ? benzonatate (TESSALON) 200 MG capsule Take 1 capsule (200 mg total) by mouth 2 (two) times daily as needed for cough. 20 capsule 0  ? CALCIUM PO Take 1 tablet by mouth daily.    ? dexlansoprazole (DEXILANT) 60 MG capsule TAKE 1 CAPSULE BY MOUTH  ONCE DAILY WITH BREAKFAST 90 capsule 3  ? famotidine (PEPCID) 20 MG tablet One after supper 30 tablet 11  ?  Ferric Maltol (ACCRUFER) 30 MG CAPS Take 1 capsule by mouth in the morning and at bedtime. 180 capsule 0  ? FLUoxetine (PROZAC) 40 MG capsule Take 40 mg by mouth daily.    ? furosemide (LASIX) 20 MG tablet TAKE 1 TABLET BY MOUTH  DAILY 90 tablet 3  ? gabapentin (NEURONTIN) 600 MG tablet Take 1 tablet (600 mg total) by mouth 3 (three) times daily. 90 tablet 2  ? Immune Globulin, Human,-klhw (XEMBIFY [place]) Inject into the skin.    ? levothyroxine (SYNTHROID) 100 MCG tablet TAKE 1 TABLET BY MOUTH ONCE DAILY BEFORE BREAKFAST 90 tablet 3  ? Multiple Vitamin (MULTIVITAMIN WITH MINERALS) TABS tablet Take 1 tablet by mouth daily.    ? pilocarpine (SALAGEN) 7.5 MG tablet Take 7.5 mg by mouth 2 (two) times daily.    ? Probiotic Product (PROBIOTIC PO) Take 1 capsule by mouth daily. Harmony probiotic    ? Respiratory Therapy Supplies (FLUTTER) DEVI Use as directed 1 each 0  ? tamsulosin (FLOMAX) 0.4 MG CAPS capsule Take 0.4 mg by mouth daily.    ? temazepam (RESTORIL) 15 MG capsule Take 15 mg by mouth  at bedtime.    ? TRELEGY ELLIPTA 100-62.5-25 MCG/ACT AEPB Inhale 1 puff into the lungs daily. 120 each 1  ? dalfampridine 10 MG TB12 Take 10 mg by mouth 2 (two) times daily. (Patient not taking: Reported on 07/18/2021)    ? sulfamethoxazole-trimethoprim (BACTRIM DS) 800-160 MG tablet Take 1 tablet by mouth 2 (two) times daily. (Patient not taking: Reported on 07/18/2021) 10 tablet 0  ? ?No facility-administered medications prior to visit.  ? ? ? ?Review of Systems:  ? ?Constitutional:   No  weight loss, night sweats,  Fevers, chills,  ?+fatigue, or  lassitude. ? ?HEENT:   No headaches,  Difficulty swallowing,  Tooth/dental problems, or  Sore throat,  ?              No sneezing, itching, ear ache, nasal congestion, post nasal drip,  ? ?CV:  No chest pain,  Orthopnea, PND, swelling in lower extremities, anasarca, dizziness, palpitations, syncope.  ? ?GI  No heartburn, indigestion, abdominal pain, nausea, vomiting, diarrhea, change in  bowel habits, loss of appetite, bloody stools.  ? ?Resp:   No chest wall deformity ? ?Skin: no rash or lesions. ? ?GU: no dysuria, change in color of urine, no urgency or frequency.  No flank pain, no hematuria  ? ?MS:

## 2021-07-18 NOTE — Addendum Note (Signed)
Addended by: Vanessa Barbara on: 07/18/2021 04:57 PM ? ? Modules accepted: Orders ? ?

## 2021-07-18 NOTE — Telephone Encounter (Signed)
Patient request to switch from Dr. Melvyn Novas to Dr. Chase Caller ? ?Please advise if this is approved ?

## 2021-07-18 NOTE — Patient Instructions (Addendum)
Robitussin DM 2 tsp every 4hrs for cough As needed   ?Tessalon Three times a day  for cough as needed  ?Flutter valve Three times a day   ?Zyrtec '10mg'$  At bedtime  As needed  drainage.  ?Continue on TRELEGY 1 puff daily .  ?CT chest w/o contrast -recurrent pneumonia  ?Sputum culture , sputum AFB and Sputum fungal .  ?Follow up in 4 weeks and As needed   ?Please contact office for sooner follow up if symptoms do not improve or worsen or seek emergency care  ? ? ?

## 2021-07-19 ENCOUNTER — Other Ambulatory Visit: Payer: Medicare Other

## 2021-07-19 DIAGNOSIS — J45909 Unspecified asthma, uncomplicated: Secondary | ICD-10-CM

## 2021-07-19 DIAGNOSIS — J189 Pneumonia, unspecified organism: Secondary | ICD-10-CM

## 2021-07-19 NOTE — Telephone Encounter (Signed)
Okk thanks ?

## 2021-07-19 NOTE — Telephone Encounter (Signed)
Lm for patient to schedule appt with MR.  ?

## 2021-07-20 LAB — RESPIRATORY CULTURE OR RESPIRATORY AND SPUTUM CULTURE: MICRO NUMBER:: 13326342

## 2021-07-23 DIAGNOSIS — B999 Unspecified infectious disease: Secondary | ICD-10-CM | POA: Diagnosis not present

## 2021-07-23 DIAGNOSIS — Z7409 Other reduced mobility: Secondary | ICD-10-CM | POA: Diagnosis not present

## 2021-07-23 DIAGNOSIS — J449 Chronic obstructive pulmonary disease, unspecified: Secondary | ICD-10-CM | POA: Diagnosis not present

## 2021-07-23 DIAGNOSIS — G35 Multiple sclerosis: Secondary | ICD-10-CM | POA: Diagnosis not present

## 2021-07-23 DIAGNOSIS — D849 Immunodeficiency, unspecified: Secondary | ICD-10-CM | POA: Diagnosis not present

## 2021-07-23 DIAGNOSIS — D472 Monoclonal gammopathy: Secondary | ICD-10-CM | POA: Diagnosis not present

## 2021-07-23 DIAGNOSIS — D801 Nonfamilial hypogammaglobulinemia: Secondary | ICD-10-CM | POA: Diagnosis not present

## 2021-07-26 ENCOUNTER — Other Ambulatory Visit: Payer: Self-pay | Admitting: Neurology

## 2021-07-26 DIAGNOSIS — G35 Multiple sclerosis: Secondary | ICD-10-CM

## 2021-07-27 ENCOUNTER — Other Ambulatory Visit: Payer: Self-pay | Admitting: Internal Medicine

## 2021-07-29 ENCOUNTER — Ambulatory Visit (HOSPITAL_COMMUNITY)
Admission: RE | Admit: 2021-07-29 | Discharge: 2021-07-29 | Disposition: A | Discharge status: Home / Self Care | Payer: Medicare Other | Source: Ambulatory Visit | Attending: Adult Health | Admitting: Adult Health

## 2021-07-29 DIAGNOSIS — J189 Pneumonia, unspecified organism: Secondary | ICD-10-CM | POA: Insufficient documentation

## 2021-07-29 DIAGNOSIS — I3139 Other pericardial effusion (noninflammatory): Secondary | ICD-10-CM | POA: Diagnosis not present

## 2021-07-29 DIAGNOSIS — J9811 Atelectasis: Secondary | ICD-10-CM | POA: Diagnosis not present

## 2021-07-29 DIAGNOSIS — R0602 Shortness of breath: Secondary | ICD-10-CM | POA: Diagnosis not present

## 2021-07-29 MED ORDER — TEMAZEPAM 15 MG PO CAPS: 15.0000 mg | ORAL_CAPSULE | Freq: Every day | ORAL | 2 refills | Status: DC

## 2021-08-01 MED ORDER — FLUCONAZOLE 100 MG PO TABS: ORAL_TABLET | ORAL | 0 refills | Status: DC

## 2021-08-01 NOTE — Addendum Note (Signed)
Addended by: Melvenia Needles on: 08/01/2021 05:25 PM ? ? Modules accepted: Orders ? ?

## 2021-08-02 ENCOUNTER — Telehealth: Payer: Self-pay | Admitting: Adult Health

## 2021-08-02 DIAGNOSIS — E559 Vitamin D deficiency, unspecified: Secondary | ICD-10-CM | POA: Diagnosis not present

## 2021-08-02 DIAGNOSIS — G35 Multiple sclerosis: Secondary | ICD-10-CM | POA: Diagnosis not present

## 2021-08-02 DIAGNOSIS — R202 Paresthesia of skin: Secondary | ICD-10-CM | POA: Diagnosis not present

## 2021-08-02 DIAGNOSIS — E538 Deficiency of other specified B group vitamins: Secondary | ICD-10-CM | POA: Diagnosis not present

## 2021-08-02 DIAGNOSIS — Z79899 Other long term (current) drug therapy: Secondary | ICD-10-CM | POA: Diagnosis not present

## 2021-08-02 DIAGNOSIS — D72819 Decreased white blood cell count, unspecified: Secondary | ICD-10-CM | POA: Diagnosis not present

## 2021-08-02 MED ORDER — FLUCONAZOLE 100 MG PO TABS: 100.0000 mg | ORAL_TABLET | Freq: Every day | ORAL | 0 refills | Status: AC

## 2021-08-02 NOTE — Telephone Encounter (Signed)
The message is for CT Scan results, left yesterday to call back for results  ?Please see CT scan results . I called and left her a message on vm.  ? ?Tried to call back again today , went straight to vm.  ? ? ?CT chest shows increased RUL/left sided opacities - frothy debris in trachea /  ?Concern for aspiration , hx of MS and vocal cord paralysis .  ?Aspiration precautions.  ?  ?Sputum fungal culture was positive for Candida albicans -heavy growth  ?  ?Rec : Diflucan '100mg'$  2 tabs first day then 1 tab daily for 1 week .  ?Will discuss in full detail at office visit this month  ?Please contact office for sooner follow up if symptoms do not improve or worsen or seek emergency care  ?

## 2021-08-02 NOTE — Telephone Encounter (Signed)
Medication sent into pharmacy! Kayla [name] tried to contact patient to go over CT. Will try patient again!  ?

## 2021-08-02 NOTE — Telephone Encounter (Signed)
CT chest shows increased RUL/left sided opacities - frothy debris in trachea /  ?Concern for aspiration , hx of MS and vocal cord paralysis .  ?Aspiration precautions.  ?  ?Sputum fungal culture was positive for Candida albicans -heavy growth  ?  ?Rec : Diflucan '100mg'$  2 tabs first day then 1 tab daily for 1 week .  ?Will discuss in full detail at office visit this month  ?Please contact office for sooner follow up if symptoms do not improve or worsen or seek emergency care  ?  ?LMOMTCB 08/01/2021 tp  ? ?Spoke with the pt and notified of results/recs. She verbalized understanding. I went over the following swallowing precautions with her and she verbalized understanding. Nothing further needed. Diflucan already sent.  ? ?Aspiration Precautions ?SMALL BITES. ?SMALL SIPS. ?Alternate liquid and solid swallows. ?Ensure oral clearance after each bite. ?Sit upright (90 degrees) when eating and drinking and for 30 minutes after eating. ?Multiple swallows per bite/sip. ?Small, frequent meals throughout the day. ?Consume one pill at a time ?

## 2021-08-02 NOTE — Telephone Encounter (Signed)
I already sent Diflucan rx in yesterday ?Rx sent today is incorrect, needs to be called and cancelled please  ?

## 2021-08-06 DIAGNOSIS — R296 Repeated falls: Secondary | ICD-10-CM | POA: Diagnosis not present

## 2021-08-06 DIAGNOSIS — G35 Multiple sclerosis: Secondary | ICD-10-CM | POA: Diagnosis not present

## 2021-08-06 DIAGNOSIS — R260 Ataxic gait: Secondary | ICD-10-CM | POA: Diagnosis not present

## 2021-08-06 DIAGNOSIS — S42211D Unspecified displaced fracture of surgical neck of right humerus, subsequent encounter for fracture with routine healing: Secondary | ICD-10-CM | POA: Diagnosis not present

## 2021-08-12 DIAGNOSIS — R296 Repeated falls: Secondary | ICD-10-CM | POA: Diagnosis not present

## 2021-08-12 DIAGNOSIS — S42211D Unspecified displaced fracture of surgical neck of right humerus, subsequent encounter for fracture with routine healing: Secondary | ICD-10-CM | POA: Diagnosis not present

## 2021-08-12 DIAGNOSIS — R260 Ataxic gait: Secondary | ICD-10-CM | POA: Diagnosis not present

## 2021-08-12 DIAGNOSIS — G35 Multiple sclerosis: Secondary | ICD-10-CM | POA: Diagnosis not present

## 2021-08-13 ENCOUNTER — Ambulatory Visit
Admission: RE | Admit: 2021-08-13 | Discharge: 2021-08-13 | Disposition: A | Discharge status: Home / Self Care | Payer: Medicare Other | Source: Ambulatory Visit | Attending: Neurology | Admitting: Neurology

## 2021-08-13 DIAGNOSIS — G35 Multiple sclerosis: Secondary | ICD-10-CM | POA: Diagnosis not present

## 2021-08-14 DIAGNOSIS — S42211D Unspecified displaced fracture of surgical neck of right humerus, subsequent encounter for fracture with routine healing: Secondary | ICD-10-CM | POA: Diagnosis not present

## 2021-08-14 DIAGNOSIS — R260 Ataxic gait: Secondary | ICD-10-CM | POA: Diagnosis not present

## 2021-08-14 DIAGNOSIS — R296 Repeated falls: Secondary | ICD-10-CM | POA: Diagnosis not present

## 2021-08-14 DIAGNOSIS — G35 Multiple sclerosis: Secondary | ICD-10-CM | POA: Diagnosis not present

## 2021-08-15 ENCOUNTER — Encounter: Payer: Self-pay | Admitting: Adult Health

## 2021-08-15 ENCOUNTER — Ambulatory Visit (INDEPENDENT_AMBULATORY_CARE_PROVIDER_SITE_OTHER): Payer: Medicare Other | Admitting: Adult Health

## 2021-08-15 VITALS — BP 112/68 | HR 95 | Temp 97.9°F | Ht 63.0 in | Wt 111.8 lb

## 2021-08-15 DIAGNOSIS — J418 Mixed simple and mucopurulent chronic bronchitis: Secondary | ICD-10-CM

## 2021-08-15 DIAGNOSIS — J189 Pneumonia, unspecified organism: Secondary | ICD-10-CM

## 2021-08-15 DIAGNOSIS — I75023 Atheroembolism of bilateral lower extremities: Secondary | ICD-10-CM | POA: Diagnosis not present

## 2021-08-15 DIAGNOSIS — R9389 Abnormal findings on diagnostic imaging of other specified body structures: Secondary | ICD-10-CM | POA: Diagnosis not present

## 2021-08-15 DIAGNOSIS — J45991 Cough variant asthma: Secondary | ICD-10-CM | POA: Diagnosis not present

## 2021-08-15 NOTE — Assessment & Plan Note (Signed)
Patient's had recurrent bronchitis/pneumonia over the last 10 months or so.  She had 2 hospitalizations in July and then again in August 2022.  Underwent bronchoscopy with negative cytology for malignant cells.  Culture did show Streptococcus constellatus she was treated with 8 weeks of Rocephin.  Ocrevus was stopped.  There was some concern for Boop/COPD she did receive a short course of steroids during hospitalization.  Autoimmune labs  work-up was negative. Patient is followed by immunology on weekly IVIG for immune deficiency. She has been evaluated by a new neurologist for her ongoing MS treatment.  Currently off of Ocrevus for almost 1 year due to recurrent infection She has been followed by ENT for known vocal fold paralysis.  Swallow evaluation showed no overt aspiration but at risk with pharyngeal dysphagia Recent CT chest with patchy infiltrates in right upper lobe and left lower lobe.  Clinically patient appears stable.  Sputum fungal did show heavy Candida growth.  She was given a 7-day course of Diflucan  We will check labs with sed rate.  If elevated , differential for Boop/COP  Plan  Patient Instructions  Robitussin DM 2 tsp every 4hrs for cough As needed   Tessalon Three times a day  for cough as needed  Flutter valve Three times a day   Continue on TRELEGY 1 puff daily .  Albuterol inhaler or neb As needed   Follow up in 3 months with Dr. Chase Caller with PFT and Feno and As needed   Please contact office for sooner follow up if symptoms do not improve or worsen or seek emergency care

## 2021-08-15 NOTE — Patient Instructions (Addendum)
Robitussin DM 2 tsp every 4hrs for cough As needed   Tessalon Three times a day  for cough as needed  Flutter valve Three times a day   Continue on TRELEGY 1 puff daily .  Albuterol inhaler or neb As needed   Follow up in 3 months with Dr. Chase Caller with PFT and Feno and As needed   Please contact office for sooner follow up if symptoms do not improve or worsen or seek emergency care

## 2021-08-15 NOTE — Assessment & Plan Note (Signed)
Needs PFT and exhaled nitric oxide testing on return visit Continue on Trelegy.  Oral inhaler care discussed  Plan  Patient Instructions  Robitussin DM 2 tsp every 4hrs for cough As needed   Tessalon Three times a day  for cough as needed  Flutter valve Three times a day   Continue on TRELEGY 1 puff daily .  Albuterol inhaler or neb As needed   Follow up in 3 months with Dr. Chase Caller with PFT and Feno and As needed   Please contact office for sooner follow up if symptoms do not improve or worsen or seek emergency care

## 2021-08-15 NOTE — Progress Notes (Signed)
_0  ID: Kayla [name], female    DOB: 05-26-1950, 71 y.o.   MRN: [id number]  Chief Complaint  Patient presents with   Follow-up    Referring provider: Janith Lima, MD  HPI: 71 year old female never smoker followed for asthma medical history significant for multiple sclerosis and bilateral vocal fold paralysis and stridor followed by ENT.   TEST/EVENTS :  CT chest September 27, 2020 showed groundglass opacities in the right lower lobe and left lower lobe concerning for multifocal pneumonia.  14 x 10 mm nodular density in the right middle lobe Modified barium swallow was negative   CT chest on August 3 showed groundglass attenuation consolidation with waxing and waning groundglass attenuation.  Autoimmune work-up was negative.  Fungal cultures were negative.  AFB sputum was negative.  BAL cultures showed rare Streptococcus Constellatus.  (Sensitive to ceftriaxone, levofloxacin and vancomycin.   intermediate to penicillin).  Sed rate was elevated at 80. Cytology showed reactive bronchial cells.  Benign lung tissue.    August 2022.  CT chest showed groundglass attenuation and waxing waning groundglass.  Autoimmune work-up was negative.  Fungal cultures AFB sputum was negative.  BAL cultures showed rare Streptococcus  Constellatus, sed rate was 80.  Cytology showed reactive bronchial cells and benign lung tissue.  Patient was referred to immunology  ENT consult 09/2015 -[name] - Vocal folds are immobile and in paramedian position. They are approximated such that voice comes out clearly but she has inspiratory stridor (ENT note - felt secondary most likely to MS   modified barium swallow July 26 fifth 2022 that showed dysphagia in the pharyngeal phase.  No overt aspiration.  CT chest Jul 29, 2021 mild apical scarring, mild consolidation in the medial aspect of the right upper lobe, patchy infiltrates in the mid to lower left lung.  May 21, 2021 eosinophils 100  08/15/2021 Follow up :  Asthma and Pneumonia  Patient returns for 1 month follow-up.  Over the last year patient has had recurrent episodes of pneumonia and bronchitis.  She was admitted in July 2022 for community-acquired pneumonia.  She was treated with IV antibiotics.  CT chest September 27, 2020 showed groundglass opacities in the right lower lobe and left lower lobe concerning for multifocal pneumonia.  She was readmitted in August 2022.  CT chest showed groundglass attenuation and waxing and waning groundglass.  Autoimmune work-up was negative.  She underwent bronchoscopy with negative fungal and AFB.  BAL cultures were positive for rare Streptococcus constellatus.  Sed rate was elevated at 80.  Treated with Antibiotics and Steroids for possible recurrent CAP vs BOOP/COP. Cytology showed reactive bronchial cells and benign lung tissue.  She was referred to immunology.  Her Ocrevus therapy for MS was stopped due to recurrent infections.  She received 8 weeks of Rocephin.  And she is now on weekly IVIG per immunology.  Patient has recently been reestablished with a new neurologist and is undergoing new testing with MRI. Patient is on Trelegy inhaler for her underlying asthma.  Patient's main complaint is she has ongoing cough and congestion. Patient has longstanding chronic cough upper airway irritation.  She has been evaluated by ENT Dr. [name].  She has known vocal fold paralysis, felt this is most likely due to her underlying MS.  She has intermittent stridor with this. Patient underwent modified barium swallow July 26 fifth 2022 that showed dysphagia in the pharyngeal phase.  No overt aspiration. Last visit patient was recommended on cough control regimen with  Robitussin, Tessalon and flutter valve.  Patient says she has good and bad days.  Always has an ongoing cough that is quite productive at times.  We also talked about aspiration precautions. Last office visit . Sputum culture and AFB was not adequate  Sputum Fungal + heavy  candida .  CT chest Jul 29, 2021 showed mild consolidation in the right upper lobe and patchy infiltrates in the mid to lower lung field on the left.  Frothy debris in the trachea and left mainstem bronchus questionable aspiration pneumonia.  She was treated with 1 week of Diflucan .  Patient denies any fever, discolored mucus.  Appetite is at baseline. We discussed aspiration precautions.. Allergies  Allergen Reactions   Tramadol     hallucinations    Contrast Media [Iodinated Contrast Media] Rash   Iohexol Hives   Morphine And Related Other (See Comments)    Reaction:  Hallucinations    Methylprednisolone Other (See Comments)    Reaction:  Decreases pts heart rate    Oxycodone-Acetaminophen Rash and Other (See Comments)    Reaction:  Hallucinations    Phenytoin Sodium Extended Rash   Zanaflex [Tizanidine Hydrochloride] Rash    Immunization History  Administered Date(s) Administered   DTP 04/10/2009   Fluad Quad(high Dose 65+) 12/29/2018   Influenza Split 12/10/2010, 02/11/2012, 12/22/2012, 01/02/2014, 12/22/2014   Influenza Whole 02/19/2005, 03/28/2009, 01/23/2016   Influenza, High Dose Seasonal PF 02/23/2018   Influenza,inj,Quad PF,6+ Mos 02/10/2017   Influenza-Unspecified 12/02/2018, 01/08/2019, 12/22/2019, 01/15/2021, 02/05/2021   Moderna Covid-19 Vaccine Bivalent Booster 23yr & up 02/05/2021   Moderna Sars-Covid-2 Vaccination 04/29/2019, 06/01/2019, 01/19/2020, 08/14/2020   Pneumococcal Conjugate-13 11/06/2015   Pneumococcal Polysaccharide-23 03/28/2009, 04/26/2019   Tdap 04/26/2019   Zoster Recombinat (Shingrix) 09/07/2019, 02/17/2020    Past Medical History:  Diagnosis Date   Arthritis    oa   [place]   COPD [place]chronic obstructive [place] disease) [place]    Multiple sclerosis ([facility]    Multiple sclerosis [place] 1996   Neuromuscular disorder ([facility]    Osteoporosis    Other diseases of vocal cords    vocal cords are not even   Other voice and  resonance disorders    Pneumonia 2014, 1992   Sleep apnea    no cpap used   Unspecified hypothyroidism     Tobacco History: Social History   Tobacco Use  Smoking Status Never  Smokeless Tobacco Never   Counseling given: Not Answered   Outpatient Medications Prior to Visit  Medication Sig Dispense Refill   albuterol (VENTOLIN HFA) 108 (90 Base) MCG/ACT inhaler INHALE 2 PUFFS INTO LUNGS EVERY 6 HOURS AS NEEDED. 18 g 0   amphetamine-dextroamphetamine (ADDERALL) 20 MG tablet Take 1 tablet (20 mg total) by mouth daily. 30 tablet 0   baclofen (LIORESAL) 20 MG tablet Take 20 mg by mouth 3 (three) times daily as needed for muscle spasms.     CALCIUM PO Take 1 tablet by mouth daily.     dexlansoprazole (DEXILANT) 60 MG capsule TAKE 1 CAPSULE BY MOUTH  ONCE DAILY WITH BREAKFAST 90 capsule 3   famotidine (PEPCID) 20 MG tablet One after supper 30 tablet 11   Ferric Maltol (ACCRUFER) 30 MG CAPS Take 1 capsule by mouth in the morning and at bedtime. 180 capsule 0   FLUoxetine (PROZAC) 40 MG capsule Take 40 mg by mouth daily.     furosemide (LASIX) 20 MG tablet TAKE 1 TABLET BY MOUTH  DAILY 90 tablet 3  Immune Globulin, Human,-klhw (XEMBIFY Natural Steps) Inject into the skin.     levothyroxine (SYNTHROID) 100 MCG tablet TAKE 1 TABLET BY MOUTH ONCE DAILY BEFORE BREAKFAST 90 tablet 3   Multiple Vitamin (MULTIVITAMIN WITH MINERALS) TABS tablet Take 1 tablet by mouth daily.     pilocarpine (SALAGEN) 7.5 MG tablet Take 7.5 mg by mouth 2 (two) times daily.     Probiotic Product (PROBIOTIC PO) Take 1 capsule by mouth daily. Harmony probiotic     Respiratory Therapy Supplies (FLUTTER) DEVI Use as directed 1 each 0   tamsulosin (FLOMAX) 0.4 MG CAPS capsule Take 0.4 mg by mouth daily.     temazepam (RESTORIL) 15 MG capsule Take 1 capsule (15 mg total) by mouth at bedtime. 30 capsule 2   TRELEGY ELLIPTA 100-62.5-25 MCG/ACT AEPB Inhale 1 puff into the lungs daily. 120 each 1   benzonatate (TESSALON) 200 MG capsule  Take 1 capsule (200 mg total) by mouth 2 (two) times daily as needed for cough. 20 capsule 0   fluconazole (DIFLUCAN) 100 MG tablet 2 tabs first day then 1 tab daily until gone 8 tablet 0   gabapentin (NEURONTIN) 600 MG tablet Take 1 tablet (600 mg total) by mouth 3 (three) times daily. 90 tablet 2   No facility-administered medications prior to visit.     Review of Systems:   Constitutional:   No  weight loss, night sweats,  Fevers, chills, +fatigue, or  lassitude.  HEENT:   No headaches,  Difficulty swallowing,  Tooth/dental problems, or  Sore throat,                No sneezing, itching, ear ache, nasal congestion, post nasal drip,   CV:  No chest pain,  Orthopnea, PND, swelling in lower extremities, anasarca, dizziness, palpitations, syncope.   GI  No heartburn, indigestion, abdominal pain, nausea, vomiting, diarrhea, change in bowel habits, loss of appetite, bloody stools.   Resp:   No chest wall deformity  Skin: no rash or lesions.  GU: no dysuria, change in color of urine, no urgency or frequency.  No flank pain, no hematuria   MS:  No joint pain or swelling.  No decreased range of motion.  No back pain.    Physical Exam  BP 112/68 (BP Location: Left Arm, Cuff Size: Normal)   Pulse 95   Temp 97.9 F (36.6 C)   Ht _0  (1.6 m)   Wt 111 lb 12.8 oz (50.7 kg)   SpO2 100%   BMI 19.80 kg/m   GEN: A/Ox3; pleasant , NAD   HEENT:  [place]/AT,  NOSE-clear, THROAT-clear, no lesions, no postnasal drip or exudate noted.   NECK:  Supple w/ fair ROM; no JVD; normal carotid impulses w/o bruits; no thyromegaly or nodules palpated; no lymphadenopathy.    RESP  scattered rhonchi  no accessory muscle use, no dullness to percussion  CARD:  RRR, no m/r/g, no peripheral edema, pulses intact, no cyanosis or clubbing.  GI:   Soft & nt; nml bowel sounds; no organomegaly or masses detected.   Musco: Warm bil, no deformities or joint swelling noted.   Neuro: alert, no focal deficits  noted.  Leg stimulator on right lower leg  Skin: Warm, no lesions or rashes    Lab Results:  CBC    Component Value Date/Time   WBC 3.6 (L) 05/21/2021 0918   RBC 4.28 05/21/2021 0918   HGB 12.5 05/21/2021 0918   HCT 38.3 05/21/2021 0918   PLT 229.0  05/21/2021 0918   MCV 89.4 05/21/2021 0918   MCH 27.3 10/24/2020 0504   MCHC 32.7 05/21/2021 0918   RDW 16.6 (H) 05/21/2021 0918   LYMPHSABS 1.1 05/21/2021 0918   MONOABS 0.5 05/21/2021 0918   EOSABS 0.1 05/21/2021 0918   BASOSABS 0.0 05/21/2021 0918    BMET    Component Value Date/Time   NA 135 05/21/2021 0918   K 3.9 05/21/2021 0918   CL 98 05/21/2021 0918   CO2 26 05/21/2021 0918   GLUCOSE 95 05/21/2021 0918   BUN 16 05/21/2021 0918   CREATININE 0.80 05/21/2021 0918   CALCIUM 9.6 05/21/2021 0918   GFRNONAA >60 10/23/2020 1328   GFRAA >60 06/08/2017 0704    BNP    Component Value Date/Time   BNP 129.3 (H) 10/23/2020 1231    ProBNP    Component Value Date/Time   PROBNP 40.0 08/08/2015 1622    Imaging: CT Chest Wo Contrast  Result Date: 07/30/2021 CLINICAL DATA:  Recurrent pneumonia, shortness of breath, cough, history of COPD. EXAM: CT CHEST WITHOUT CONTRAST TECHNIQUE: Multidetector CT imaging of the chest was performed following the standard protocol without IV contrast. RADIATION DOSE REDUCTION: This exam was performed according to the departmental dose-optimization program which includes automated exposure control, adjustment of the mA and/or kV according to patient size and/or use of iterative reconstruction technique. COMPARISON:  10/24/2020. FINDINGS: Cardiovascular: The heart is normal in size and there is a trace pericardial effusion. There is atherosclerotic calcification of the aorta without evidence of aneurysm. The pulmonary trunk is normal in caliber. Mediastinum/Nodes: Nonenlarged lymph nodes are present in the mediastinum and axillary regions bilaterally. Evaluation of the hila is limited due to lack  of IV contrast. The thyroid gland is within normal limits. Frothy debris is noted in the trachea and left mainstem bronchus. The esophagus is within normal limits. Lungs/Pleura: Mild apical pleural scarring is noted bilaterally. There is mild consolidation in the medial aspect of the right upper lobe. Mild atelectasis is noted at the right lung base. There are patchy infiltrates in the mid to lower left lung field. No effusion or pneumothorax. Upper Abdomen: Stable hypodensities are present in the right lobe of the liver. Musculoskeletal: Degenerative changes are present in the thoracic spine. There is a stable bony deformity of the proximal sternal body, likely related to old healed fracture. No acute osseous abnormality. IMPRESSION: 1. Mild consolidation in the medial aspect of the right upper lobe and patchy infiltrates in the mid to lower lung fields on the left, concerning for pneumonia. Frothy debris is present in the trachea and left mainstem bronchus and aspiration pneumonia should be considered in the differential diagnosis. 2. Aortic atherosclerosis. Electronically Signed   By: Brett Fairy M.D.   On: 07/30/2021 20:17   MR CERVICAL SPINE WO CONTRAST  Result Date: 08/14/2021 CLINICAL DATA:  Multiple sclerosis since 1996 EXAM: MRI CERVICAL AND THORACIC SPINE WITHOUT CONTRAST TECHNIQUE: Multiplanar and multiecho pulse sequences of the cervical spine, to include the craniocervical junction and cervicothoracic junction, and the thoracic spine, were obtained without intravenous contrast. COMPARISON:  MRI cervical spine 12/21/2019 FINDINGS: MRI CERVICAL SPINE FINDINGS Alignment: Mild retrolisthesis C2-3. Previously noted anterolisthesis C3-4 is nearly completely resolved. Mild anterolisthesis C4-5 unchanged. Mild anterolisthesis C7-T1 unchanged. Vertebrae: Negative for fracture or mass. Hemangioma T1 vertebral body stable. Cord: Normal signal and morphology. No spinal cord plaque in the cervical spine  Posterior Fossa, vertebral arteries, paraspinal tissues: Limited imaging of the brain is negative. No soft tissue  abnormality in the neck. Disc levels: C2-3: Mild disc degeneration and spurring.  Negative for stenosis C3-4: Mild disc degeneration and spurring without stenosis C4-5: Mild disc and mild facet degeneration.  Negative for stenosis C5-6: Moderate disc degeneration. Mild uncinate spurring. Negative for spinal or foraminal stenosis C6-7: Moderate disc degeneration with mild spurring. Negative for stenosis C7-T1: Negative for stenosis MRI THORACIC SPINE FINDINGS Alignment:  Normal Vertebrae: Negative for fracture or mass. Scattered hemangiomata are present throughout the thoracic spine. Cord: Normal signal and morphology. No evidence of demyelinating disease. Paraspinal and other soft tissues: Negative for paraspinous mass or fluid Disc levels: Mild disc degeneration throughout the thoracic spine T6-7: Small right-sided disc protrusion without stenosis T7-8: Small left-sided disc protrusion with mild cord flattening ventrally. No significant stenosis T8-9: Small left-sided disc protrusion without stenosis T12-L1: Disc degeneration with Schmorl's node and mild spurring. Negative for stenosis IMPRESSION: 1. Cervical spondylosis as described above. No significant stenosis or neural impingement 2. Negative for demyelinating disease in the cervical or thoracic spinal cord Thoracic spondylosis as above without significant stenosis. Electronically Signed   By: Franchot Gallo M.D.   On: 08/14/2021 12:00   MR THORACIC SPINE WO CONTRAST  Result Date: 08/14/2021 CLINICAL DATA:  Multiple sclerosis since 1996 EXAM: MRI CERVICAL AND THORACIC SPINE WITHOUT CONTRAST TECHNIQUE: Multiplanar and multiecho pulse sequences of the cervical spine, to include the craniocervical junction and cervicothoracic junction, and the thoracic spine, were obtained without intravenous contrast. COMPARISON:  MRI cervical spine 12/21/2019  FINDINGS: MRI CERVICAL SPINE FINDINGS Alignment: Mild retrolisthesis C2-3. Previously noted anterolisthesis C3-4 is nearly completely resolved. Mild anterolisthesis C4-5 unchanged. Mild anterolisthesis C7-T1 unchanged. Vertebrae: Negative for fracture or mass. Hemangioma T1 vertebral body stable. Cord: Normal signal and morphology. No spinal cord plaque in the cervical spine Posterior Fossa, vertebral arteries, paraspinal tissues: Limited imaging of the brain is negative. No soft tissue abnormality in the neck. Disc levels: C2-3: Mild disc degeneration and spurring.  Negative for stenosis C3-4: Mild disc degeneration and spurring without stenosis C4-5: Mild disc and mild facet degeneration.  Negative for stenosis C5-6: Moderate disc degeneration. Mild uncinate spurring. Negative for spinal or foraminal stenosis C6-7: Moderate disc degeneration with mild spurring. Negative for stenosis C7-T1: Negative for stenosis MRI THORACIC SPINE FINDINGS Alignment:  Normal Vertebrae: Negative for fracture or mass. Scattered hemangiomata are present throughout the thoracic spine. Cord: Normal signal and morphology. No evidence of demyelinating disease. Paraspinal and other soft tissues: Negative for paraspinous mass or fluid Disc levels: Mild disc degeneration throughout the thoracic spine T6-7: Small right-sided disc protrusion without stenosis T7-8: Small left-sided disc protrusion with mild cord flattening ventrally. No significant stenosis T8-9: Small left-sided disc protrusion without stenosis T12-L1: Disc degeneration with Schmorl's node and mild spurring. Negative for stenosis IMPRESSION: 1. Cervical spondylosis as described above. No significant stenosis or neural impingement 2. Negative for demyelinating disease in the cervical or thoracic spinal cord Thoracic spondylosis as above without significant stenosis. Electronically Signed   By: Franchot Gallo M.D.   On: 08/14/2021 12:00          View : No data to display.           No results found for: NITRICOXIDE      Assessment & Plan:   Cough variant asthma vs vcd  related to MS  Needs PFT and exhaled nitric oxide testing on return visit Continue on Trelegy.  Oral inhaler care discussed  Plan  Patient Instructions  Robitussin DM 2 tsp every  4hrs for cough As needed   Tessalon Three times a day  for cough as needed  Flutter valve Three times a day   Continue on TRELEGY 1 puff daily .  Albuterol inhaler or neb As needed   Follow up in 3 months with Dr. Chase Caller with PFT and Feno and As needed   Please contact office for sooner follow up if symptoms do not improve or worsen or seek emergency care        Abnormal CT of the chest Patient's had recurrent bronchitis/pneumonia over the last 10 months or so.  She had 2 hospitalizations in July and then again in August 2022.  Underwent bronchoscopy with negative cytology for malignant cells.  Culture did show Streptococcus constellatus she was treated with 8 weeks of Rocephin.  Ocrevus was stopped.  There was some concern for Boop/COPD she did receive a short course of steroids during hospitalization.  Autoimmune labs  work-up was negative. Patient is followed by immunology on weekly IVIG for immune deficiency. She has been evaluated by a new neurologist for her ongoing MS treatment.  Currently off of Ocrevus for almost 1 year due to recurrent infection She has been followed by ENT for known vocal fold paralysis.  Swallow evaluation showed no overt aspiration but at risk with pharyngeal dysphagia Recent CT chest with patchy infiltrates in right upper lobe and left lower lobe.  Clinically patient appears stable.  Sputum fungal did show heavy Candida growth.  She was given a 7-day course of Diflucan  We will check labs with sed rate.  If elevated , differential for Boop/COP  Plan  Patient Instructions  Robitussin DM 2 tsp every 4hrs for cough As needed   Tessalon Three times a day  for cough as  needed  Flutter valve Three times a day   Continue on TRELEGY 1 puff daily .  Albuterol inhaler or neb As needed   Follow up in 3 months with Dr. Chase Caller with PFT and Feno and As needed   Please contact office for sooner follow up if symptoms do not improve or worsen or seek emergency care           Rexene Edison, NP 08/15/2021

## 2021-08-17 ENCOUNTER — Ambulatory Visit
Admission: RE | Admit: 2021-08-17 | Discharge: 2021-08-17 | Disposition: A | Discharge status: Home / Self Care | Payer: Medicare Other | Source: Ambulatory Visit | Attending: Neurology | Admitting: Neurology

## 2021-08-17 DIAGNOSIS — G35 Multiple sclerosis: Secondary | ICD-10-CM | POA: Diagnosis not present

## 2021-08-19 DIAGNOSIS — R8290 Unspecified abnormal findings in urine: Secondary | ICD-10-CM

## 2021-08-20 LAB — FUNGUS CULTURE W SMEAR
MICRO NUMBER:: 13326343
SMEAR:: NONE SEEN
SPECIMEN QUALITY:: ADEQUATE

## 2021-08-20 NOTE — Telephone Encounter (Signed)
I can do that UA and Urine culture .  Typically Dr. Ronnald Ramp takes care of. Can send to him if needed .  lease contact office for sooner follow up if symptoms do not improve or worsen or seek emergency care

## 2021-08-20 NOTE — Telephone Encounter (Signed)
Kayla [name], pt is requesting UA to be ordered when for when she comes for labs tomorrow  She is c/o cloudy foul smelling urine  Please advise if okay to order, thanks

## 2021-08-21 ENCOUNTER — Other Ambulatory Visit (INDEPENDENT_AMBULATORY_CARE_PROVIDER_SITE_OTHER): Payer: Medicare Other

## 2021-08-21 DIAGNOSIS — J189 Pneumonia, unspecified organism: Secondary | ICD-10-CM

## 2021-08-21 DIAGNOSIS — R296 Repeated falls: Secondary | ICD-10-CM | POA: Diagnosis not present

## 2021-08-21 DIAGNOSIS — S42211D Unspecified displaced fracture of surgical neck of right humerus, subsequent encounter for fracture with routine healing: Secondary | ICD-10-CM | POA: Diagnosis not present

## 2021-08-21 DIAGNOSIS — R260 Ataxic gait: Secondary | ICD-10-CM | POA: Diagnosis not present

## 2021-08-21 DIAGNOSIS — R8290 Unspecified abnormal findings in urine: Secondary | ICD-10-CM

## 2021-08-21 DIAGNOSIS — G35 Multiple sclerosis: Secondary | ICD-10-CM | POA: Diagnosis not present

## 2021-08-21 LAB — URINALYSIS, ROUTINE W REFLEX MICROSCOPIC
Bilirubin Urine: NEGATIVE
Hgb urine dipstick: NEGATIVE
Ketones, ur: NEGATIVE
Nitrite: NEGATIVE
RBC / HPF: NONE SEEN (ref 0–?)
Specific Gravity, Urine: 1.015 (ref 1.000–1.030)
Total Protein, Urine: NEGATIVE
Urine Glucose: NEGATIVE
Urobilinogen, UA: 0.2 (ref 0.0–1.0)
pH: 6 (ref 5.0–8.0)

## 2021-08-21 LAB — SEDIMENTATION RATE: Sed Rate: 12 mm/hr (ref 0–30)

## 2021-08-22 NOTE — Telephone Encounter (Signed)
See other email

## 2021-08-23 DIAGNOSIS — R296 Repeated falls: Secondary | ICD-10-CM | POA: Diagnosis not present

## 2021-08-23 DIAGNOSIS — S42211D Unspecified displaced fracture of surgical neck of right humerus, subsequent encounter for fracture with routine healing: Secondary | ICD-10-CM | POA: Diagnosis not present

## 2021-08-23 DIAGNOSIS — R260 Ataxic gait: Secondary | ICD-10-CM | POA: Diagnosis not present

## 2021-08-23 DIAGNOSIS — G35 Multiple sclerosis: Secondary | ICD-10-CM | POA: Diagnosis not present

## 2021-08-23 LAB — URINE CULTURE
MICRO NUMBER:: 13466776
SPECIMEN QUALITY:: ADEQUATE

## 2021-08-26 ENCOUNTER — Other Ambulatory Visit: Payer: Self-pay | Admitting: *Deleted

## 2021-08-26 MED ORDER — CIPROFLOXACIN HCL 250 MG PO TABS: 250.0000 mg | ORAL_TABLET | Freq: Two times a day (BID) | ORAL | 0 refills | Status: DC

## 2021-08-26 NOTE — Addendum Note (Signed)
Addended by: Monna Fam L on: 08/26/2021 02:04 PM   Modules accepted: Orders

## 2021-08-26 NOTE — Addendum Note (Signed)
Addended by: Rosana Berger on: 08/26/2021 04:41 PM   Modules accepted: Orders

## 2021-08-26 NOTE — Telephone Encounter (Signed)
Please see Urine culture results.   +UTI , begin Cipro '250mg'$  Twice daily  x 1 week.  .fu with PCP  Please contact office for sooner follow up if symptoms do not improve or worsen or seek emergency care

## 2021-08-26 NOTE — Progress Notes (Signed)
Called and spoke with patient, advised or results/recommendations per Rexene Edison NP, she verbalized understanding.  She stated that she needed the Cipro sent to [facility] in [facility] instead of [place].  Advised I would take care of it for her.  [place] and cancelled the Cipro there and sent script to [facility].  Nothing further needed.

## 2021-08-30 ENCOUNTER — Other Ambulatory Visit (HOSPITAL_BASED_OUTPATIENT_CLINIC_OR_DEPARTMENT_OTHER): Payer: Self-pay

## 2021-08-30 ENCOUNTER — Other Ambulatory Visit: Payer: Self-pay | Admitting: Internal Medicine

## 2021-08-30 DIAGNOSIS — G35 Multiple sclerosis: Secondary | ICD-10-CM

## 2021-08-30 DIAGNOSIS — R5382 Chronic fatigue, unspecified: Secondary | ICD-10-CM

## 2021-08-30 MED ORDER — AMPHETAMINE-DEXTROAMPHETAMINE 20 MG PO TABS
20.0000 mg | ORAL_TABLET | Freq: Every day | ORAL | 0 refills | Status: DC
  Filled 2021-08-30: qty 30, 30d supply, fill #0

## 2021-09-01 ENCOUNTER — Other Ambulatory Visit: Payer: Self-pay | Admitting: Internal Medicine

## 2021-09-01 DIAGNOSIS — D508 Other iron deficiency anemias: Secondary | ICD-10-CM

## 2021-09-02 MED ORDER — ACCRUFER 30 MG PO CAPS: 1.0000 | ORAL_CAPSULE | Freq: Two times a day (BID) | ORAL | 0 refills | Status: DC

## 2021-09-03 ENCOUNTER — Telehealth: Payer: Self-pay | Admitting: Adult Health

## 2021-09-03 DIAGNOSIS — A319 Mycobacterial infection, unspecified: Secondary | ICD-10-CM

## 2021-09-03 NOTE — Telephone Encounter (Signed)
Spoke with Amy from [place]. She was calling to report a positive AFB culture for the patient. She will fax over a copy of the results since they are not yet available in Epic yet.   Will route to TP so she is aware.

## 2021-09-04 DIAGNOSIS — G35 Multiple sclerosis: Secondary | ICD-10-CM | POA: Diagnosis not present

## 2021-09-04 DIAGNOSIS — S42211D Unspecified displaced fracture of surgical neck of right humerus, subsequent encounter for fracture with routine healing: Secondary | ICD-10-CM | POA: Diagnosis not present

## 2021-09-04 DIAGNOSIS — R260 Ataxic gait: Secondary | ICD-10-CM | POA: Diagnosis not present

## 2021-09-04 DIAGNOSIS — R296 Repeated falls: Secondary | ICD-10-CM | POA: Diagnosis not present

## 2021-09-04 NOTE — Telephone Encounter (Signed)
Please let patient know we had a call report from the lab that her sputum for AFB culture did come back positive I am waiting for final results Called to discuss results with patient, unable to leave voicemail.  Would recommend patient be referred to infectious disease -as she has recurrent infections, and immunosuppression.  Please refer to ID for positive AFB

## 2021-09-06 NOTE — Telephone Encounter (Signed)
Called and spoke with patient, provided results/recommendations per Rexene Edison NP.  She verbalized understanding.  Referral placed to ID.  Nothing further needed.

## 2021-09-09 DIAGNOSIS — G35 Multiple sclerosis: Secondary | ICD-10-CM | POA: Diagnosis not present

## 2021-09-09 DIAGNOSIS — R260 Ataxic gait: Secondary | ICD-10-CM | POA: Diagnosis not present

## 2021-09-09 DIAGNOSIS — S42211D Unspecified displaced fracture of surgical neck of right humerus, subsequent encounter for fracture with routine healing: Secondary | ICD-10-CM | POA: Diagnosis not present

## 2021-09-09 DIAGNOSIS — R296 Repeated falls: Secondary | ICD-10-CM | POA: Diagnosis not present

## 2021-09-10 DIAGNOSIS — J449 Chronic obstructive pulmonary disease, unspecified: Secondary | ICD-10-CM | POA: Diagnosis not present

## 2021-09-10 DIAGNOSIS — B371 Pulmonary candidiasis: Secondary | ICD-10-CM | POA: Diagnosis not present

## 2021-09-10 DIAGNOSIS — D839 Common variable immunodeficiency, unspecified: Secondary | ICD-10-CM | POA: Diagnosis not present

## 2021-09-11 DIAGNOSIS — R296 Repeated falls: Secondary | ICD-10-CM | POA: Diagnosis not present

## 2021-09-11 DIAGNOSIS — S42211D Unspecified displaced fracture of surgical neck of right humerus, subsequent encounter for fracture with routine healing: Secondary | ICD-10-CM | POA: Diagnosis not present

## 2021-09-11 DIAGNOSIS — R260 Ataxic gait: Secondary | ICD-10-CM | POA: Diagnosis not present

## 2021-09-11 DIAGNOSIS — G35 Multiple sclerosis: Secondary | ICD-10-CM | POA: Diagnosis not present

## 2021-09-11 LAB — AFB ID BY DNA PROBE
M avium complex: NEGATIVE
M tuberculosis complex: NEGATIVE

## 2021-09-11 LAB — ORGANISM ID BY SEQUENCING

## 2021-09-11 LAB — AFB CULTURE WITH SMEAR (NOT AT ARMC)
Acid Fast Culture: POSITIVE — AB
Acid Fast Smear: NEGATIVE

## 2021-09-13 ENCOUNTER — Ambulatory Visit: Payer: Medicare Other

## 2021-09-15 NOTE — Progress Notes (Signed)
Patient Active Problem List   Diagnosis Date Noted   Abnormal CT of the chest 07/18/2021   Persistent cough 06/25/2021   E. coli UTI 05/24/2021   Malodorous urine 05/21/2021   COPD (chronic obstructive pulmonary disease) (HCC)    Iron deficiency anemia secondary to inadequate dietary iron intake 12/20/2020   Purple toe syndrome of both feet ([place]) 07/26/2020   Cough variant asthma vs vcd  related to MS  11/10/2016   Positional sleep apnea 09/10/2016   Periodic limb movements of sleep 09/10/2016   OSA on CPAP 02/08/2016   Hyperlipidemia 11/04/2013   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/21/2013   Dislocation of hip prosthesis ([place]) 03/12/2013   Urinary retention with incomplete bladder emptying 03/09/2013   Dysphagia, pharyngoesophageal phase 03/09/2013   Constipation 02/26/2013   Post-menopausal osteoporosis 09/17/2007   Hypothyroidism 04/01/2007   Multiple sclerosis ([place]) 04/01/2007   Other diseases of vocal cords 04/01/2007    Patient's Medications  New Prescriptions   No medications on file  Previous Medications   ALBUTEROL (VENTOLIN HFA) 108 (90 BASE) MCG/ACT INHALER    INHALE 2 PUFFS INTO LUNGS EVERY 6 HOURS AS NEEDED.   AMPHETAMINE-DEXTROAMPHETAMINE (ADDERALL) 20 MG TABLET    Take 1 tablet (20 mg total) by mouth daily.   BACLOFEN (LIORESAL) 20 MG TABLET    Take 20 mg by mouth 3 (three) times daily as needed for muscle spasms.   CALCIUM PO    Take 1 tablet by mouth daily.   CIPROFLOXACIN (CIPRO) 250 MG TABLET    Take 1 tablet (250 mg total) by mouth 2 (two) times daily.   DEXLANSOPRAZOLE (DEXILANT) 60 MG CAPSULE    TAKE 1 CAPSULE BY MOUTH  ONCE DAILY WITH BREAKFAST   FAMOTIDINE (PEPCID) 20 MG TABLET    One after supper   FERRIC MALTOL (ACCRUFER) 30 MG CAPS    Take 1 capsule by mouth in the morning and at bedtime.   FLUOXETINE (PROZAC) 40 MG CAPSULE    Take 40 mg by mouth daily.   FUROSEMIDE (LASIX) 20 MG TABLET    TAKE 1 TABLET BY MOUTH  DAILY   IMMUNE GLOBULIN, HUMAN,-KLHW (XEMBIFY  )    Inject into the skin.   LEVOTHYROXINE (SYNTHROID) 100 MCG TABLET    TAKE 1 TABLET BY MOUTH ONCE DAILY BEFORE BREAKFAST   MULTIPLE VITAMIN (MULTIVITAMIN WITH MINERALS) TABS TABLET    Take 1 tablet by mouth daily.   PILOCARPINE (SALAGEN) 7.5 MG TABLET    Take 7.5 mg by mouth 2 (two) times daily.   PROBIOTIC PRODUCT (PROBIOTIC PO)    Take 1 capsule by mouth daily. Harmony probiotic   RESPIRATORY THERAPY SUPPLIES (FLUTTER) DEVI    Use as directed   TAMSULOSIN (FLOMAX) 0.4 MG CAPS CAPSULE    Take 0.4 mg by mouth daily.   TEMAZEPAM (RESTORIL) 15 MG CAPSULE    Take 1 capsule (15 mg total) by mouth at bedtime.   TRELEGY ELLIPTA 100-62.5-25 MCG/ACT AEPB    Inhale 1 puff into the lungs daily.  Modified Medications   No medications on file  Discontinued Medications   No medications on file    Subjective: 71 YO female with PMH of OA/Osteoporosis, Cervical ca s/p hysterectomy, COPD, cough variant asthma, long standing h/o MS  approx 27 years, dysphagia for approx 15 years, Hip # in 2004 s/p  Rt hip arthroplasty 2014 followed by conversion to total hip arthroplasty 2017 then hip revision 2019, B/L Vocal cord paralysis  noticed by  DR [name] in May 2022, hypothyroidism, GERD, who is referred for 4/28 sputum cx growing Mycobacterium mucogenicum/phocaicum. Accompanied by husband.   Started having cough since March 2022. Cough is productive mostly mucoid with yellowish/brown phlegm. She had PNA in July and August 2022.   Admitted 09/2020 with history of fever and chronic cough for 4 months.  She had failed outpatient antibiotics and steroids.  CT chest 7/7 with findings as below.  PCT negative. MRSA PCR, COVID and flu were negative.  Respiratory virus panel was also negative.  Blood cultures were no growth.  The urine Streptococcus and Legionella was negative. there was concerns for aspiration pneumonitis per SLP. Swallow eval 7/25 with no overt aspiration but pharyngeal dysphagia. She was discharged on  09/29/2020 with outpatient follow-up with GI and pulmonary.  Received IV abtx in the hospital but  no antibiotics or steroids were prescribed.  Re-admitted 10/2020 with ongoing intractable cough and SOB. CT  chest 8/4 with findings as below.  S/p bronchoscopy and biopsy 8/5 with negative cx except candida albicans and streptococcus constellatus. Patient initially on abtx and steroids that was discontinued. High concerns for BOOP. Cytology showed reactive bronchial cells and benign lung tissue. Discharged home on symptomatic treatment and fu with Pulmonary. She was referred to immunology.  Her Ocrevus therapy for MS was stopped due to recurrent infections.  She received  8 weeks of Rocephin.  And she is now on weekly IVIG per immunology.  Previous MS doctor passed and will be seeing a new doctor in July 2023. Per husband, her IG was so low it was not listed in the chart and now it has improved.   Husband says she coughs throughout the day and night to the extent that she cannot get sleep and she is fatigued. She has a stimulator at the rt leg, able to walk with the help of walker. She gets help from husband for household work like cooking, Education administrator, Medical sales representative. Husband tells she is very slow at what she does.  She has difficulty swallowing due to MS.    Denies fevers, chills and sweats Denies nausea, vomiting and diarrhea.  She self caths and tell she has had recurrent UTIs lately Appetite is not good and but husband make sure she eats 3 meals a day. Lost approx 10 lbs in 1 year or so.   Denies ever smoking, alcohol and IVDU. No pets at home. Works as a Psychologist, forensic  Review of Systems: all systems reviewed with pertinent positives and negatives as listed above  Past Medical History:  Diagnosis Date   Arthritis    oa   Cervical cancer ([place]) 2005   COPD (chronic obstructive pulmonary disease) ([place])    Multiple sclerosis ([place])    Multiple sclerosis ([place]) 1996   Neuromuscular disorder ([place])     Osteoporosis    Other diseases of vocal cords    vocal cords are not even   Other voice and resonance disorders    Pneumonia 2014, 1992   Sleep apnea    no cpap used   Unspecified hypothyroidism    Past Surgical History:  Procedure Laterality Date   ABDOMINAL HYSTERECTOMY  03/25/2003   for cervical cancer, complete   BRONCHIAL BIOPSY  10/26/2020   Procedure: BRONCHIAL BIOPSIES;  Surgeon: Garner Nash, DO;  Location: [facility];  Service: Pulmonary;;   BRONCHIAL BRUSHINGS  10/26/2020   Procedure: BRONCHIAL BRUSHINGS;  Surgeon: Garner Nash, DO;  Location: [facility];  Service: Pulmonary;;   BRONCHIAL  WASHINGS  10/26/2020   Procedure: BRONCHIAL WASHINGS;  Surgeon: Garner Nash, DO;  Location: [facility];  Service: Pulmonary;;   CATARACT EXTRACTION, BILATERAL Bilateral    lens for cataracts   CONVERSION TO TOTAL HIP Right 09/18/2015   Procedure: CONVERSION OF RIGHT HEMI ARTHROPLASTY TO TOTAL HIP ARTHROPLASTY ACETABULAR REVISON ;  Surgeon: Paralee Cancel, MD;  Location: [facility];  Service: Orthopedics;  Laterality: Right;   HIP ARTHROPLASTY Right 03/01/2013   Procedure: ARTHROPLASTY BIPOLAR HIP;  Surgeon: Mauri Pole, MD;  Location: [facility];  Service: Orthopedics;  Laterality: Right;   HIP CLOSED REDUCTION Right 03/12/2013   Procedure: CLOSED REDUCTION HIP;  Surgeon: Gearlean Alf, MD;  Location: [facility];  Service: Orthopedics;  Laterality: Right;   HIP CLOSED REDUCTION Right 03/19/2013   Procedure: CLOSED REDUCTION HIP;  Surgeon: Johnn Hai, MD;  Location: [facility];  Service: Orthopedics;  Laterality: Right;   HIP CLOSED REDUCTION Right 11/10/2015   Procedure: CLOSED MANIPULATION HIP;  Surgeon: Rod Can, MD;  Location: [facility];  Service: Orthopedics;  Laterality: Right;   HIP CLOSED REDUCTION Right 06/05/2017   Procedure: CLOSED REDUCTION HIP;  Surgeon: [name], MD;  Location: [facility];  Service: Orthopedics;  Laterality: Right;   SMALL INTESTINE SURGERY      TOTAL HIP REVISION Right 06/06/2017   Procedure: TOTAL HIP REVISION;  Surgeon: [name], MD;  Location: [facility];  Service: Orthopedics;  Laterality: Right;   VESICOVAGINAL FISTULA CLOSURE W/ TAH  03/25/2003   VIDEO BRONCHOSCOPY N/A 10/26/2020   Procedure: VIDEO BRONCHOSCOPY WITH FLUORO;  Surgeon: Garner Nash, DO;  Location: [place] [facility];  Service: Pulmonary;  Laterality: N/A;    Social History   Tobacco Use   Smoking status: Never   Smokeless tobacco: Never  Vaping Use   Vaping Use: Never used  Substance Use Topics   Alcohol use: Yes    Alcohol/week: 2.0 standard drinks of alcohol    Types: 1 Glasses of wine, 1 Cans of beer per week    Comment: 2x/week    Drug use: No    Family History  Problem Relation Age of Onset   Other Mother        COVID   Colon cancer Father    Atrial fibrillation Brother    Heart attack Maternal Grandfather    Parkinson's disease Paternal Grandfather    Esophageal cancer Neg Hx    Stomach cancer Neg Hx    Rectal cancer Neg Hx     Allergies  Allergen Reactions   Tramadol     hallucinations    Contrast Media [Iodinated Contrast Media] Rash   Iohexol Hives   Morphine And Related Other (See Comments)    Reaction:  Hallucinations    Methylprednisolone Other (See Comments)    Reaction:  Decreases pts heart rate    Oxycodone-Acetaminophen Rash and Other (See Comments)    Reaction:  Hallucinations    Phenytoin Sodium Extended Rash   Zanaflex [Tizanidine Hydrochloride] Rash    Health Maintenance  Topic Date Due   Hepatitis C Screening  Never done   INFLUENZA VACCINE  10/22/2021   MAMMOGRAM  03/25/2023   COLONOSCOPY (Pts 45-6yr Insurance coverage will need to be confirmed)  03/05/2026   TETANUS/TDAP  04/25/2029   Pneumonia Vaccine 71 Years old  Completed   DEXA SCAN  Completed   COVID-19 Vaccine  Completed   Zoster Vaccines- Shingrix  Completed   HPV VACCINES  Aged Out    Objective:  BP 118/61   Pulse 93   Temp 98.3 F  (36.8 C) (Temporal)   Ht '5\' 3"'$  (1.6 m)   Wt 114 lb (51.7 kg)   SpO2 99%   BMI 20.19 kg/m    Physical Exam Constitutional:      Appearance: Normal appearance. Elderly caucasian female HENT:     Head: Normocephalic and atraumatic.      Mouth: Mucous membranes are moist.  Eyes:    Conjunctiva/sclera: Conjunctivae normal.     Pupils:   Cardiovascular:     Rate and Rhythm: Normal rate and regular rhythm.     Heart sounds:   Pulmonary:     Effort: Pulmonary effort is normal.     Breath sounds: decreased b/l air entry   Abdominal:     General: Non distended     Palpations: soft.   Musculoskeletal:        General: Normal range of motion. RT leg has a stimulator. B/l Purple toes   Skin:    General: Skin is warm and dry.     Comments:  Neurological:     General: grossly non focal     Mental Status: awake, alert and oriented to person, place, and time.   Psychiatric:        Mood and Affect: Mood normal.   Lab Results Lab Results  Component Value Date   WBC 3.6 (L) 05/21/2021   HGB 12.5 05/21/2021   HCT 38.3 05/21/2021   MCV 89.4 05/21/2021   PLT 229.0 05/21/2021    Lab Results  Component Value Date   CREATININE 0.80 05/21/2021   BUN 16 05/21/2021   NA 135 05/21/2021   K 3.9 05/21/2021   CL 98 05/21/2021   CO2 26 05/21/2021    Lab Results  Component Value Date   ALT 15 10/23/2020   AST 20 10/23/2020   ALKPHOS 84 10/23/2020   BILITOT 0.4 10/23/2020    Lab Results  Component Value Date   CHOL 262 (H) 07/26/2020   HDL 129.90 07/26/2020   LDLCALC 121 (H) 07/26/2020   LDLDIRECT 116.1 06/25/2010   TRIG 55.0 07/26/2020   CHOLHDL 2 07/26/2020   No results found for: "LABRPR", "RPRTITER" No results found for: "HIV1RNAQUANT", "HIV1RNAVL", "CD4TABS"   Microbiology No results found.  Imaging No results found.  Problem List Items Addressed This Visit   None Visit Diagnoses     Pulmonary mycobacterial infection ([place])    -  Primary   Relevant  Medications   fluconazole (DIFLUCAN) 100 MG tablet   Other Relevant Orders    MYCOBACTERIA, CULTURE, WITH FLUOROCHROME SMEAR    MYCOBACTERIA, CULTURE, WITH FLUOROCHROME SMEAR    MYCOBACTERIA, CULTURE, WITH FLUOROCHROME SMEAR (Completed)      Assessment/Plan # Sputum cx positive for M mucogenicum/phocaicum # COPD, Cough Variant Asthma # Multiple sclerosis with associated B/L Vocal Cord Paralysis  09/28/20 respiratory cx normal respiratory flora 10/26/20 AFB smear and cx negative, strep constellatus , candida albicans ( tx with 8 weeks of IV ceftriaxone) 07/19/21 AFB smear and cx M mucogenicum/phocaicum, candida albicans ( tx 7 days of Fluconazole)  Does not meet criteria for Pulmonary NTM infection and hence, no indication to treat AFB sputum smear and cultures * 3 Fu in 3 months  Fu with Pulmonary, Allergy, ENT as instructed   I have personally spent more than 70 minutes involved in face-to-face and non-face-to-face activities for this patient on the day of the visit. Professional time spent includes the following activities: Preparing  to see the patient (review of tests), Obtaining and/or reviewing separately obtained history (admission/discharge record), Performing a medically appropriate examination and/or evaluation , Ordering medications/tests/procedures, referring and communicating with other health care professionals, Documenting clinical information in the EMR, Independently interpreting results (not separately reported), Communicating results to the patient/family/caregiver, Counseling and educating the patient/family/caregiver and Care coordination (not separately reported).   Wilber Oliphant, [facility] 09/15/2021, 12:05 PM

## 2021-09-16 ENCOUNTER — Other Ambulatory Visit: Payer: Self-pay

## 2021-09-16 ENCOUNTER — Ambulatory Visit (INDEPENDENT_AMBULATORY_CARE_PROVIDER_SITE_OTHER): Payer: Medicare Other | Admitting: Infectious Diseases

## 2021-09-16 ENCOUNTER — Encounter: Payer: Self-pay | Admitting: Infectious Diseases

## 2021-09-16 ENCOUNTER — Other Ambulatory Visit: Payer: Self-pay | Admitting: Internal Medicine

## 2021-09-16 VITALS — BP 118/61 | HR 93 | Temp 98.3°F | Ht 63.0 in | Wt 114.0 lb

## 2021-09-16 DIAGNOSIS — G35D Multiple sclerosis, unspecified: Secondary | ICD-10-CM

## 2021-09-16 DIAGNOSIS — J3802 Paralysis of vocal cords and larynx, bilateral: Secondary | ICD-10-CM

## 2021-09-16 DIAGNOSIS — J45991 Cough variant asthma: Secondary | ICD-10-CM

## 2021-09-16 DIAGNOSIS — G35 Multiple sclerosis: Secondary | ICD-10-CM

## 2021-09-16 DIAGNOSIS — A310 Pulmonary mycobacterial infection: Secondary | ICD-10-CM | POA: Diagnosis not present

## 2021-09-16 DIAGNOSIS — J449 Chronic obstructive pulmonary disease, unspecified: Secondary | ICD-10-CM

## 2021-09-16 DIAGNOSIS — J418 Mixed simple and mucopurulent chronic bronchitis: Secondary | ICD-10-CM

## 2021-09-17 ENCOUNTER — Other Ambulatory Visit: Payer: Self-pay

## 2021-09-17 ENCOUNTER — Other Ambulatory Visit: Payer: Medicare Other

## 2021-09-17 DIAGNOSIS — A310 Pulmonary mycobacterial infection: Secondary | ICD-10-CM

## 2021-09-18 DIAGNOSIS — R296 Repeated falls: Secondary | ICD-10-CM | POA: Diagnosis not present

## 2021-09-18 DIAGNOSIS — G35 Multiple sclerosis: Secondary | ICD-10-CM | POA: Diagnosis not present

## 2021-09-18 DIAGNOSIS — R260 Ataxic gait: Secondary | ICD-10-CM | POA: Diagnosis not present

## 2021-09-18 DIAGNOSIS — S42211D Unspecified displaced fracture of surgical neck of right humerus, subsequent encounter for fracture with routine healing: Secondary | ICD-10-CM | POA: Diagnosis not present

## 2021-09-19 ENCOUNTER — Other Ambulatory Visit: Payer: Self-pay

## 2021-09-19 ENCOUNTER — Other Ambulatory Visit: Payer: Medicare Other

## 2021-09-19 DIAGNOSIS — A310 Pulmonary mycobacterial infection: Secondary | ICD-10-CM | POA: Diagnosis not present

## 2021-09-20 DIAGNOSIS — S42211D Unspecified displaced fracture of surgical neck of right humerus, subsequent encounter for fracture with routine healing: Secondary | ICD-10-CM | POA: Diagnosis not present

## 2021-09-20 DIAGNOSIS — R296 Repeated falls: Secondary | ICD-10-CM | POA: Diagnosis not present

## 2021-09-20 DIAGNOSIS — R260 Ataxic gait: Secondary | ICD-10-CM | POA: Diagnosis not present

## 2021-09-20 DIAGNOSIS — G35 Multiple sclerosis: Secondary | ICD-10-CM | POA: Diagnosis not present

## 2021-09-23 ENCOUNTER — Other Ambulatory Visit: Payer: Self-pay

## 2021-09-23 ENCOUNTER — Other Ambulatory Visit: Payer: Medicare Other

## 2021-09-23 DIAGNOSIS — R296 Repeated falls: Secondary | ICD-10-CM | POA: Diagnosis not present

## 2021-09-23 DIAGNOSIS — R260 Ataxic gait: Secondary | ICD-10-CM | POA: Diagnosis not present

## 2021-09-23 DIAGNOSIS — S42211D Unspecified displaced fracture of surgical neck of right humerus, subsequent encounter for fracture with routine healing: Secondary | ICD-10-CM | POA: Diagnosis not present

## 2021-09-23 DIAGNOSIS — A310 Pulmonary mycobacterial infection: Secondary | ICD-10-CM | POA: Diagnosis not present

## 2021-09-23 DIAGNOSIS — G35 Multiple sclerosis: Secondary | ICD-10-CM | POA: Diagnosis not present

## 2021-09-24 DIAGNOSIS — J449 Chronic obstructive pulmonary disease, unspecified: Secondary | ICD-10-CM | POA: Insufficient documentation

## 2021-09-24 DIAGNOSIS — J3802 Paralysis of vocal cords and larynx, bilateral: Secondary | ICD-10-CM | POA: Insufficient documentation

## 2021-09-24 DIAGNOSIS — A310 Pulmonary mycobacterial infection: Secondary | ICD-10-CM | POA: Insufficient documentation

## 2021-09-25 ENCOUNTER — Other Ambulatory Visit (HOSPITAL_BASED_OUTPATIENT_CLINIC_OR_DEPARTMENT_OTHER): Payer: Self-pay

## 2021-09-25 ENCOUNTER — Other Ambulatory Visit: Payer: Self-pay | Admitting: Internal Medicine

## 2021-09-25 DIAGNOSIS — G35 Multiple sclerosis: Secondary | ICD-10-CM

## 2021-09-25 DIAGNOSIS — R296 Repeated falls: Secondary | ICD-10-CM | POA: Diagnosis not present

## 2021-09-25 DIAGNOSIS — S42211D Unspecified displaced fracture of surgical neck of right humerus, subsequent encounter for fracture with routine healing: Secondary | ICD-10-CM | POA: Diagnosis not present

## 2021-09-25 DIAGNOSIS — R5382 Chronic fatigue, unspecified: Secondary | ICD-10-CM

## 2021-09-25 DIAGNOSIS — R260 Ataxic gait: Secondary | ICD-10-CM | POA: Diagnosis not present

## 2021-09-25 MED ORDER — AMPHETAMINE-DEXTROAMPHETAMINE 20 MG PO TABS
20.0000 mg | ORAL_TABLET | Freq: Every day | ORAL | 0 refills | Status: DC
  Filled 2021-09-25 – 2021-09-29 (×2): qty 30, 30d supply, fill #0

## 2021-09-30 ENCOUNTER — Other Ambulatory Visit (HOSPITAL_BASED_OUTPATIENT_CLINIC_OR_DEPARTMENT_OTHER): Payer: Self-pay

## 2021-09-30 DIAGNOSIS — R296 Repeated falls: Secondary | ICD-10-CM | POA: Diagnosis not present

## 2021-09-30 DIAGNOSIS — S42211D Unspecified displaced fracture of surgical neck of right humerus, subsequent encounter for fracture with routine healing: Secondary | ICD-10-CM | POA: Diagnosis not present

## 2021-09-30 DIAGNOSIS — G35 Multiple sclerosis: Secondary | ICD-10-CM | POA: Diagnosis not present

## 2021-09-30 DIAGNOSIS — R260 Ataxic gait: Secondary | ICD-10-CM | POA: Diagnosis not present

## 2021-10-02 DIAGNOSIS — G35 Multiple sclerosis: Secondary | ICD-10-CM | POA: Diagnosis not present

## 2021-10-02 DIAGNOSIS — R296 Repeated falls: Secondary | ICD-10-CM | POA: Diagnosis not present

## 2021-10-02 DIAGNOSIS — R260 Ataxic gait: Secondary | ICD-10-CM | POA: Diagnosis not present

## 2021-10-02 DIAGNOSIS — S42211D Unspecified displaced fracture of surgical neck of right humerus, subsequent encounter for fracture with routine healing: Secondary | ICD-10-CM | POA: Diagnosis not present

## 2021-10-04 ENCOUNTER — Ambulatory Visit (INDEPENDENT_AMBULATORY_CARE_PROVIDER_SITE_OTHER): Payer: Medicare Other

## 2021-10-04 DIAGNOSIS — Z0000 Encounter for general adult medical examination without abnormal findings: Secondary | ICD-10-CM | POA: Diagnosis not present

## 2021-10-04 NOTE — Progress Notes (Signed)
Subjective:   Kayla [name] is a 71 y.o. female who presents for Medicare Annual (Subsequent) preventive examination.   I connected with Kayla [name]  today by telephone and verified that I am speaking with the correct person using two identifiers. Location patient: home Location provider: work Persons participating in the virtual visit: patient, provider.   I discussed the limitations, risks, security and privacy concerns of performing an evaluation and management service by telephone and the availability of in person appointments. I also discussed with the patient that there may be a patient responsible charge related to this service. The patient expressed understanding and verbally consented to this telephonic visit.    Interactive audio and video telecommunications were attempted between this provider and patient, however failed, due to patient having technical difficulties OR patient did not have access to video capability.  We continued and completed visit with audio only.    Review of Systems     Cardiac Risk Factors include: advanced age (>8mn, >>84women)     Objective:    Today's Vitals   There is no height or weight on file to calculate BMI.     10/04/2021   10:13 AM 10/26/2020    6:37 AM 10/23/2020    3:50 PM 10/23/2020   12:32 PM 09/27/2020    2:30 PM 09/27/2020    9:56 AM 09/11/2020    9:13 AM  Advanced Directives  Does Patient Have a Medical Advance Directive? Yes Yes Yes Yes Yes Yes Yes  Type of AParamedicof [place] will [place]Living will HP[place]Living will [place]Living will HB[place]Living will HMiami SpringsLiving will HBrowns ValleyLiving will  Does patient want to make changes to medical advance directive?   No - Patient declined  No - Patient declined    Copy of HFountainin Chart? No - copy  requested    No - copy requested  No - copy requested  Would patient like information on creating a medical advance directive?   No - Patient declined No - Patient declined       Current Medications (verified) Outpatient Encounter Medications as of 10/04/2021  Medication Sig   albuterol (VENTOLIN HFA) 108 (90 Base) MCG/ACT inhaler INHALE 2 PUFFS INTO LUNGS EVERY 6 HOURS AS NEEDED.   amphetamine-dextroamphetamine (ADDERALL) 20 MG tablet Take 1 tablet (20 mg total) by mouth daily.   baclofen (LIORESAL) 20 MG tablet Take 20 mg by mouth 3 (three) times daily as needed for muscle spasms.   CALCIUM PO Take 1 tablet by mouth daily.   dexlansoprazole (DEXILANT) 60 MG capsule TAKE 1 CAPSULE BY MOUTH  ONCE DAILY WITH BREAKFAST   famotidine (PEPCID) 20 MG tablet One after supper   Ferric Maltol (ACCRUFER) 30 MG CAPS Take 1 capsule by mouth in the morning and at bedtime.   fluconazole (DIFLUCAN) 100 MG tablet Take 100 mg by mouth daily.   FLUoxetine (PROZAC) 40 MG capsule Take 40 mg by mouth daily.   furosemide (LASIX) 20 MG tablet TAKE 1 TABLET BY MOUTH  DAILY   Immune Globulin, Human,-klhw (XEMBIFY [place]) Inject into the skin.   levothyroxine (SYNTHROID) 100 MCG tablet TAKE 1 TABLET BY MOUTH ONCE DAILY BEFORE BREAKFAST   Multiple Vitamin (MULTIVITAMIN WITH MINERALS) TABS tablet Take 1 tablet by mouth daily.   pilocarpine (SALAGEN) 7.5 MG tablet Take 7.5 mg by mouth 2 (two) times daily.  Probiotic Product (PROBIOTIC PO) Take 1 capsule by mouth daily. Harmony probiotic   Respiratory Therapy Supplies (FLUTTER) DEVI Use as directed   tamsulosin (FLOMAX) 0.4 MG CAPS capsule Take 0.4 mg by mouth daily.   temazepam (RESTORIL) 15 MG capsule Take 1 capsule (15 mg total) by mouth at bedtime.   TRELEGY ELLIPTA 100-62.5-25 MCG/ACT AEPB Inhale 1 puff into the lungs daily.   No facility-administered encounter medications on file as of 10/04/2021.    Allergies (verified) Tramadol, Contrast media [iodinated  contrast media], Iohexol, Morphine and related, Methylprednisolone, Oxycodone-acetaminophen, Phenytoin sodium extended, and Zanaflex [tizanidine hydrochloride]   History: Past Medical History:  Diagnosis Date   Arthritis    oa   Cervical cancer ([place]) 2005   COPD (chronic obstructive pulmonary disease) ([place])    Multiple sclerosis (HCC)    Multiple sclerosis ([place]) 1996   Neuromuscular disorder ([place])    Osteoporosis    Other diseases of vocal cords    vocal cords are not even   Other voice and resonance disorders    Pneumonia 2014, 1992   Sleep apnea    no cpap used   Unspecified hypothyroidism    Past Surgical History:  Procedure Laterality Date   ABDOMINAL HYSTERECTOMY  03/25/2003   for cervical cancer, complete   BRONCHIAL BIOPSY  10/26/2020   Procedure: BRONCHIAL BIOPSIES;  Surgeon: Garner Nash, DO;  Location: [facility];  Service: Pulmonary;;   BRONCHIAL BRUSHINGS  10/26/2020   Procedure: BRONCHIAL BRUSHINGS;  Surgeon: Garner Nash, DO;  Location: [facility];  Service: Pulmonary;;   BRONCHIAL WASHINGS  10/26/2020   Procedure: BRONCHIAL WASHINGS;  Surgeon: Garner Nash, DO;  Location: [facility];  Service: Pulmonary;;   CATARACT EXTRACTION, BILATERAL Bilateral    lens for cataracts   CONVERSION TO TOTAL HIP Right 09/18/2015   Procedure: CONVERSION OF RIGHT HEMI ARTHROPLASTY TO TOTAL HIP ARTHROPLASTY ACETABULAR REVISON ;  Surgeon: Paralee Cancel, MD;  Location: [facility];  Service: Orthopedics;  Laterality: Right;   HIP ARTHROPLASTY Right 03/01/2013   Procedure: ARTHROPLASTY BIPOLAR HIP;  Surgeon: Mauri Pole, MD;  Location: [facility];  Service: Orthopedics;  Laterality: Right;   HIP CLOSED REDUCTION Right 03/12/2013   Procedure: CLOSED REDUCTION HIP;  Surgeon: Gearlean Alf, MD;  Location: [facility];  Service: Orthopedics;  Laterality: Right;   HIP CLOSED REDUCTION Right 03/19/2013   Procedure: CLOSED REDUCTION HIP;  Surgeon: Johnn Hai, MD;  Location: [facility];   Service: Orthopedics;  Laterality: Right;   HIP CLOSED REDUCTION Right 11/10/2015   Procedure: CLOSED MANIPULATION HIP;  Surgeon: Rod Can, MD;  Location: [facility];  Service: Orthopedics;  Laterality: Right;   HIP CLOSED REDUCTION Right 06/05/2017   Procedure: CLOSED REDUCTION HIP;  Surgeon: [name], MD;  Location: [facility];  Service: Orthopedics;  Laterality: Right;   SMALL INTESTINE SURGERY     TOTAL HIP REVISION Right 06/06/2017   Procedure: TOTAL HIP REVISION;  Surgeon: [name], MD;  Location[facility];  Service: Orthopedics;  Laterality: Right;   VESICOVAGINAL FISTULA CLOSURE W/ TAH  03/25/2003   VIDEO BRONCHOSCOPY N/A 10/26/2020   Procedure: VIDEO BRONCHOSCOPY WITH FLUORO;  Surgeon: Garner Nash, DO;  Location: [place];  Service: Pulmonary;  Laterality: N/A;   Family History  Problem Relation Age of Onset   Other Mother        COVID   Colon cancer Father    Atrial fibrillation Brother    Heart attack Maternal Grandfather  Parkinson's disease Paternal Grandfather    Esophageal cancer Neg Hx    Stomach cancer Neg Hx    Rectal cancer Neg Hx    Social History   Socioeconomic History   Marital status: Married    Spouse name: Not on file   Number of children: 2   Years of education: Not on file   Highest education level: Not on file  Occupational History   Occupation: disabled [place]  Tobacco Use   Smoking status: Never   Smokeless tobacco: Never  Vaping Use   Vaping Use: Never used  Substance and Sexual Activity   Alcohol use: Yes    Alcohol/week: 2.0 standard drinks of alcohol    Types: 1 Glasses of wine, 1 Cans of beer per week    Comment: 2x/week    Drug use: No   Sexual activity: Not on file  Other Topics Concern   Not on file  Social History Narrative   Not on file   Social Determinants of Health   Financial Resource Strain: Low Risk  (10/04/2021)   Overall Financial Resource Strain (CARDIA)    Difficulty of Paying  Living Expenses: Not hard at all  Food Insecurity: No Food Insecurity (10/04/2021)   Hunger Vital Sign    Worried About Running Out of Food in the Last Year: Never true    Ran Out of Food in the Last Year: Never true  Transportation Needs: No Transportation Needs (10/04/2021)   PRAPARE - Hydrologist (Medical): No    Lack of Transportation (Non-Medical): No  Physical Activity: Insufficiently Active (10/04/2021)   Exercise Vital Sign    Days of Exercise per Week: 2 days    Minutes of Exercise per Session: 60 min  Stress: No Stress Concern Present (10/04/2021)   [place]    Feeling of Stress : Not at all  Social Connections: Moderately Isolated (10/04/2021)   Social Connection and Isolation Panel [NHANES]    Frequency of Communication with Friends and Family: Three times a week    Frequency of Social Gatherings with Friends and Family: Three times a week    Attends Religious Services: Never    Active Member of Clubs or Organizations: No    Attends Music therapist: Never    Marital Status: Married    Tobacco Counseling Counseling given: Not Answered   Clinical Intake:  Pre-visit preparation completed: Yes  Pain : No/denies pain     Nutritional Risks: None  How often do you need to have someone help you when you read instructions, pamphlets, or other written materials from your doctor or pharmacy?: 1 - Never What is the last grade level you completed in school?: [employer]   Interpreter Needed?: No  Information entered by :: L.Latia Mataya,LPN   Activities of Daily Living    10/04/2021   10:16 AM 10/23/2020    3:53 PM  In your present state of health, do you have any difficulty performing the following activities:  Hearing? 0   Vision? 0   Difficulty concentrating or making decisions? 0   Walking or climbing stairs? 0   Dressing or bathing? 0   Doing  errands, shopping? 0 0  Preparing Food and eating ? N   Using the Toilet? N   In the past six months, have you accidently leaked urine? N   Do you have problems with loss of bowel control? N  Managing your Medications? N   Managing your Finances? N   Housekeeping or managing your Housekeeping? N     Patient Care Team: Janith Lima, MD as PCP - General (Internal Medicine) Delphia Grates, MD (Neurology) Melida Quitter, MD (Otolaryngology) Rosiland Oz, MD as Consulting Physician (Infectious Diseases)  Indicate any recent Medical Services you may have received from other than [facility] providers in the past year (date may be approximate).     Assessment:   This is a routine wellness examination for Kacey.  Hearing/Vision screen Vision Screening - Comments:: Annual eye exams wears glasses   Dietary issues and exercise activities discussed: Current Exercise Habits: Home exercise routine, Type of exercise: strength training/weights, Time (Minutes): 60, Frequency (Times/Week): 2, Weekly Exercise (Minutes/Week): 120, Intensity: Mild, Exercise limited by: respiratory conditions(s)   Goals Addressed   None    Depression Screen    10/04/2021   10:14 AM 10/04/2021   10:12 AM 09/16/2021    9:05 AM 09/11/2020    9:20 AM 01/20/2020    3:02 PM  PHQ 2/9 Scores  PHQ - 2 Score 0 0 1 0 0    Fall Risk    10/04/2021   10:14 AM 09/16/2021    9:05 AM 07/10/2021    8:53 AM 09/11/2020    9:14 AM 01/20/2020    3:02 PM  Fall Risk   Falls in the past year? 0 0 0 0 1  Number falls in past yr: 0  0 0 1  Injury with Fall? 0  0 0 0  Risk for fall due to : No Fall Risks No Fall Risks No Fall Risks    Risk for fall due to: Comment walker      Follow up Falls evaluation completed;Education provided Falls evaluation completed Falls evaluation completed Falls evaluation completed;Falls prevention discussed     FALL RISK PREVENTION PERTAINING TO THE HOME:  Any stairs in or around the home? Yes   If so, are there any without handrails? No  Home free of loose throw rugs in walkways, pet beds, electrical cords, etc? Yes  Adequate lighting in your home to reduce risk of falls? Yes   ASSISTIVE DEVICES UTILIZED TO PREVENT FALLS:  Life alert? No  Use of a cane, walker or w/c? Yes  Grab bars in the bathroom? Yes  Shower chair or bench in shower? Yes  Elevated toilet seat or a handicapped toilet? Yes     Cognitive Function:  Normal cognitive status assessed by telephone conversation  by this Nurse Health Advisor. No abnormalities found.        Immunizations Immunization History  Administered Date(s) Administered   DTP 04/10/2009   Fluad Quad(high Dose 65+) 12/29/2018   Influenza Split 12/10/2010, 02/11/2012, 12/22/2012, 01/02/2014, 12/22/2014   Influenza Whole 02/19/2005, 03/28/2009, 01/23/2016   Influenza, High Dose Seasonal PF 02/23/2018   Influenza,inj,Quad PF,6+ Mos 02/10/2017   Influenza-Unspecified 12/02/2018, 01/08/2019, 12/22/2019, 01/15/2021, 02/05/2021   Moderna Covid-19 Vaccine Bivalent Booster 49yr & up 02/05/2021   Moderna Sars-Covid-2 Vaccination 04/29/2019, 06/01/2019, 01/19/2020, 08/14/2020   Pneumococcal Conjugate-13 11/06/2015   Pneumococcal Polysaccharide-23 03/28/2009, 04/26/2019   Tdap 04/26/2019   Zoster Recombinat (Shingrix) 09/07/2019, 02/17/2020    TDAP status: Up to date  Flu Vaccine status: Up to date  Pneumococcal vaccine status: Up to date  Covid-19 vaccine status: Completed vaccines  Qualifies for Shingles Vaccine? Yes   Zostavax completed Yes   Shingrix Completed?: Yes  Screening Tests Health Maintenance  Topic Date Due   Hepatitis  C Screening  Never done   INFLUENZA VACCINE  10/22/2021   MAMMOGRAM  03/24/2022   COLONOSCOPY (Pts 45-51yr Insurance coverage will need to be confirmed)  03/05/2026   TETANUS/TDAP  04/25/2029   Pneumonia Vaccine 69 Years old  Completed   DEXA SCAN  Completed   COVID-19 Vaccine  Completed    Zoster Vaccines- Shingrix  Completed   HPV VACCINES  Aged Out    Health Maintenance  Health Maintenance Due  Topic Date Due   Hepatitis C Screening  Never done    Colorectal cancer screening: Type of screening: Colonoscopy. Completed 03/05/2021. Repeat every 5 years  Mammogram status: Completed 03/24/2021. Repeat every year  Bone Density status: Completed 05/01/2020. Results reflect: Bone density results: OSTEOPENIA. Repeat every 5 years.  Lung Cancer Screening: (Low Dose CT Chest recommended if Age 69-80years, 30 pack-year currently smoking OR have quit w/in 15years.) does not qualify.   Lung Cancer Screening Referral: n/a  Additional Screening:  Hepatitis C Screening: does not qualify;   Vision Screening: Recommended annual ophthalmology exams for early detection of glaucoma and other disorders of the eye. Is the patient up to date with their annual eye exam?  Yes  Who is the provider or what is the name of the office in which the patient attends annual eye exams? Dr.Dunn  If pt is not established with a provider, would they like to be referred to a provider to establish care? No .   Dental Screening: Recommended annual dental exams for proper oral hygiene  Community Resource Referral / Chronic Care Management: CRR required this visit?  No   CCM required this visit?  No      Plan:     I have personally reviewed and noted the following in the patient's chart:   Medical and social history Use of alcohol, tobacco or illicit drugs  Current medications and supplements including opioid prescriptions.  Functional ability and status Nutritional status Physical activity Advanced directives List of other physicians Hospitalizations, surgeries, and ER visits in previous 12 months Vitals Screenings to include cognitive, depression, and falls Referrals and appointments  In addition, I have reviewed and discussed with patient certain preventive protocols, quality metrics,  and best practice recommendations. A written personalized care plan for preventive services as well as general preventive health recommendations were provided to patient.     LRandel Pigg LPN   [id number]  Nurse Notes: one

## 2021-10-04 NOTE — Patient Instructions (Signed)
Kayla [name] , Thank you for taking time to come for your Medicare Wellness Visit. I appreciate your ongoing commitment to your health goals. Please review the following plan we discussed and let me know if I can assist you in the future.   Screening recommendations/referrals: Colonoscopy: 03/05/2021 Mammogram: 03/24/2021 Bone Density: 05/01/2020 Recommended yearly ophthalmology/optometry visit for glaucoma screening and checkup Recommended yearly dental visit for hygiene and checkup  Vaccinations: Influenza vaccine: completed  Pneumococcal vaccine: completed  Tdap vaccine: 04/26/2019 Shingles vaccine: completed     Advanced directives: yes   Conditions/risks identified: none   Next appointment: none    Preventive Care 12 Years and Older, Female Preventive care refers to lifestyle choices and visits with your health care provider that can promote health and wellness. What does preventive care include? A yearly physical exam. This is also called an annual well check. Dental exams once or twice a year. Routine eye exams. Ask your health care provider how often you should have your eyes checked. Personal lifestyle choices, including: Daily care of your teeth and gums. Regular physical activity. Eating a healthy diet. Avoiding tobacco and drug use. Limiting alcohol use. Practicing safe sex. Taking low-dose aspirin every day. Taking vitamin and mineral supplements as recommended by your health care provider. What happens during an annual well check? The services and screenings done by your health care provider during your annual well check will depend on your age, overall health, lifestyle risk factors, and family history of disease. Counseling  Your health care provider may ask you questions about your: Alcohol use. Tobacco use. Drug use. Emotional well-being. Home and relationship well-being. Sexual activity. Eating habits. History of falls. Memory and ability to  understand (cognition). Work and work Statistician. Reproductive health. Screening  You may have the following tests or measurements: Height, weight, and BMI. Blood pressure. Lipid and cholesterol levels. These may be checked every 5 years, or more frequently if you are over 81 years old. Skin check. Lung cancer screening. You may have this screening every year starting at age 79 if you have a 30-pack-year history of smoking and currently smoke or have quit within the past 15 years. Fecal occult blood test (FOBT) of the stool. You may have this test every year starting at age 50. Flexible sigmoidoscopy or colonoscopy. You may have a sigmoidoscopy every 5 years or a colonoscopy every 10 years starting at age 35. Hepatitis C blood test. Hepatitis B blood test. Sexually transmitted disease (STD) testing. Diabetes screening. This is done by checking your blood sugar (glucose) after you have not eaten for a while (fasting). You may have this done every 1-3 years. Bone density scan. This is done to screen for osteoporosis. You may have this done starting at age 16. Mammogram. This may be done every 1-2 years. Talk to your health care provider about how often you should have regular mammograms. Talk with your health care provider about your test results, treatment options, and if necessary, the need for more tests. Vaccines  Your health care provider may recommend certain vaccines, such as: Influenza vaccine. This is recommended every year. Tetanus, diphtheria, and acellular pertussis (Tdap, Td) vaccine. You may need a Td booster every 10 years. Zoster vaccine. You may need this after age 22. Pneumococcal 13-valent conjugate (PCV13) vaccine. One dose is recommended after age 66. Pneumococcal polysaccharide (PPSV23) vaccine. One dose is recommended after age 74. Talk to your health care provider about which screenings and vaccines you need and how often you  need them. This information is not  intended to replace advice given to you by your health care provider. Make sure you discuss any questions you have with your health care provider. Document Released: 04/06/2015 Document Revised: 11/28/2015 Document Reviewed: 01/09/2015 Elsevier Interactive Patient Education  2017 [employer] Prevention in the Home Falls can cause injuries. They can happen to people of all ages. There are many things you can do to make your home safe and to help prevent falls. What can I do on the outside of my home? Regularly fix the edges of walkways and driveways and fix any cracks. Remove anything that might make you trip as you walk through a door, such as a raised step or threshold. Trim any bushes or trees on the path to your home. Use bright outdoor lighting. Clear any walking paths of anything that might make someone trip, such as rocks or tools. Regularly check to see if handrails are loose or broken. Make sure that both sides of any steps have handrails. Any raised decks and porches should have guardrails on the edges. Have any leaves, snow, or ice cleared regularly. Use sand or salt on walking paths during winter. Clean up any spills in your garage right away. This includes oil or grease spills. What can I do in the bathroom? Use night lights. Install grab bars by the toilet and in the tub and shower. Do not use towel bars as grab bars. Use non-skid mats or decals in the tub or shower. If you need to sit down in the shower, use a plastic, non-slip stool. Keep the floor dry. Clean up any water that spills on the floor as soon as it happens. Remove soap buildup in the tub or shower regularly. Attach bath mats securely with double-sided non-slip rug tape. Do not have throw rugs and other things on the floor that can make you trip. What can I do in the bedroom? Use night lights. Make sure that you have a light by your bed that is easy to reach. Do not use any sheets or blankets that are  too big for your bed. They should not hang down onto the floor. Have a firm chair that has side arms. You can use this for support while you get dressed. Do not have throw rugs and other things on the floor that can make you trip. What can I do in the kitchen? Clean up any spills right away. Avoid walking on wet floors. Keep items that you use a lot in easy-to-reach places. If you need to reach something above you, use a strong step stool that has a grab bar. Keep electrical cords out of the way. Do not use floor polish or wax that makes floors slippery. If you must use wax, use non-skid floor wax. Do not have throw rugs and other things on the floor that can make you trip. What can I do with my stairs? Do not leave any items on the stairs. Make sure that there are handrails on both sides of the stairs and use them. Fix handrails that are broken or loose. Make sure that handrails are as long as the stairways. Check any carpeting to make sure that it is firmly attached to the stairs. Fix any carpet that is loose or worn. Avoid having throw rugs at the top or bottom of the stairs. If you do have throw rugs, attach them to the floor with carpet tape. Make sure that you have a light switch  at the top of the stairs and the bottom of the stairs. If you do not have them, ask someone to add them for you. What else can I do to help prevent falls? Wear shoes that: Do not have high heels. Have rubber bottoms. Are comfortable and fit you well. Are closed at the toe. Do not wear sandals. If you use a stepladder: Make sure that it is fully opened. Do not climb a closed stepladder. Make sure that both sides of the stepladder are locked into place. Ask someone to hold it for you, if possible. Clearly mark and make sure that you can see: Any grab bars or handrails. First and last steps. Where the edge of each step is. Use tools that help you move around (mobility aids) if they are needed. These  include: Canes. Walkers. Scooters. Crutches. Turn on the lights when you go into a dark area. Replace any light bulbs as soon as they burn out. Set up your furniture so you have a clear path. Avoid moving your furniture around. If any of your floors are uneven, fix them. If there are any pets around you, be aware of where they are. Review your medicines with your doctor. Some medicines can make you feel dizzy. This can increase your chance of falling. Ask your doctor what other things that you can do to help prevent falls. This information is not intended to replace advice given to you by your health care provider. Make sure you discuss any questions you have with your health care provider. Document Released: 01/04/2009 Document Revised: 08/16/2015 Document Reviewed: 04/14/2014 Elsevier Interactive Patient Education  2017 [employer].

## 2021-10-10 ENCOUNTER — Other Ambulatory Visit: Payer: Self-pay | Admitting: Internal Medicine

## 2021-10-10 DIAGNOSIS — R296 Repeated falls: Secondary | ICD-10-CM | POA: Diagnosis not present

## 2021-10-10 DIAGNOSIS — S42211D Unspecified displaced fracture of surgical neck of right humerus, subsequent encounter for fracture with routine healing: Secondary | ICD-10-CM | POA: Diagnosis not present

## 2021-10-10 DIAGNOSIS — N130 Hydronephrosis with ureteropelvic junction obstruction: Secondary | ICD-10-CM | POA: Diagnosis not present

## 2021-10-10 DIAGNOSIS — R350 Frequency of micturition: Secondary | ICD-10-CM | POA: Diagnosis not present

## 2021-10-10 DIAGNOSIS — G35 Multiple sclerosis: Secondary | ICD-10-CM | POA: Diagnosis not present

## 2021-10-10 DIAGNOSIS — N312 Flaccid neuropathic bladder, not elsewhere classified: Secondary | ICD-10-CM | POA: Diagnosis not present

## 2021-10-10 DIAGNOSIS — R260 Ataxic gait: Secondary | ICD-10-CM | POA: Diagnosis not present

## 2021-10-10 DIAGNOSIS — N390 Urinary tract infection, site not specified: Secondary | ICD-10-CM | POA: Diagnosis not present

## 2021-10-15 DIAGNOSIS — R260 Ataxic gait: Secondary | ICD-10-CM | POA: Diagnosis not present

## 2021-10-15 DIAGNOSIS — G35 Multiple sclerosis: Secondary | ICD-10-CM | POA: Diagnosis not present

## 2021-10-15 DIAGNOSIS — S42211D Unspecified displaced fracture of surgical neck of right humerus, subsequent encounter for fracture with routine healing: Secondary | ICD-10-CM | POA: Diagnosis not present

## 2021-10-15 DIAGNOSIS — R296 Repeated falls: Secondary | ICD-10-CM | POA: Diagnosis not present

## 2021-10-17 DIAGNOSIS — R296 Repeated falls: Secondary | ICD-10-CM | POA: Diagnosis not present

## 2021-10-17 DIAGNOSIS — S42211D Unspecified displaced fracture of surgical neck of right humerus, subsequent encounter for fracture with routine healing: Secondary | ICD-10-CM | POA: Diagnosis not present

## 2021-10-17 DIAGNOSIS — R260 Ataxic gait: Secondary | ICD-10-CM | POA: Diagnosis not present

## 2021-10-17 DIAGNOSIS — G35 Multiple sclerosis: Secondary | ICD-10-CM | POA: Diagnosis not present

## 2021-10-22 DIAGNOSIS — S42211D Unspecified displaced fracture of surgical neck of right humerus, subsequent encounter for fracture with routine healing: Secondary | ICD-10-CM | POA: Diagnosis not present

## 2021-10-22 DIAGNOSIS — R296 Repeated falls: Secondary | ICD-10-CM | POA: Diagnosis not present

## 2021-10-22 DIAGNOSIS — G35 Multiple sclerosis: Secondary | ICD-10-CM | POA: Diagnosis not present

## 2021-10-22 DIAGNOSIS — R260 Ataxic gait: Secondary | ICD-10-CM | POA: Diagnosis not present

## 2021-10-23 DIAGNOSIS — G379 Demyelinating disease of central nervous system, unspecified: Secondary | ICD-10-CM | POA: Diagnosis not present

## 2021-10-23 DIAGNOSIS — D801 Nonfamilial hypogammaglobulinemia: Secondary | ICD-10-CM | POA: Diagnosis not present

## 2021-10-23 DIAGNOSIS — D8989 Other specified disorders involving the immune mechanism, not elsewhere classified: Secondary | ICD-10-CM | POA: Diagnosis not present

## 2021-10-23 DIAGNOSIS — D472 Monoclonal gammopathy: Secondary | ICD-10-CM | POA: Diagnosis not present

## 2021-10-23 DIAGNOSIS — J449 Chronic obstructive pulmonary disease, unspecified: Secondary | ICD-10-CM | POA: Diagnosis not present

## 2021-10-23 DIAGNOSIS — D849 Immunodeficiency, unspecified: Secondary | ICD-10-CM | POA: Diagnosis not present

## 2021-10-23 DIAGNOSIS — G9332 Myalgic encephalomyelitis/chronic fatigue syndrome: Secondary | ICD-10-CM | POA: Diagnosis not present

## 2021-10-24 ENCOUNTER — Ambulatory Visit (INDEPENDENT_AMBULATORY_CARE_PROVIDER_SITE_OTHER): Payer: Medicare Other | Admitting: Internal Medicine

## 2021-10-24 DIAGNOSIS — J418 Mixed simple and mucopurulent chronic bronchitis: Secondary | ICD-10-CM

## 2021-10-24 DIAGNOSIS — G35 Multiple sclerosis: Secondary | ICD-10-CM | POA: Diagnosis not present

## 2021-10-24 DIAGNOSIS — R260 Ataxic gait: Secondary | ICD-10-CM | POA: Diagnosis not present

## 2021-10-24 DIAGNOSIS — S42211D Unspecified displaced fracture of surgical neck of right humerus, subsequent encounter for fracture with routine healing: Secondary | ICD-10-CM | POA: Diagnosis not present

## 2021-10-24 DIAGNOSIS — R296 Repeated falls: Secondary | ICD-10-CM | POA: Diagnosis not present

## 2021-10-24 LAB — PULMONARY FUNCTION TEST
DL/VA % pred: 83 %
DL/VA: 3.47 ml/min/mmHg/L
DLCO cor % pred: 60 %
DLCO cor: 11.43 ml/min/mmHg
DLCO unc % pred: 60 %
DLCO unc: 11.43 ml/min/mmHg
FEF 25-75 Post: 1.29 L/sec
FEF 25-75 Pre: 0.71 L/sec
FEF2575-%Change-Post: 81 %
FEF2575-%Pred-Post: 68 %
FEF2575-%Pred-Pre: 37 %
FEV1-%Change-Post: 45 %
FEV1-%Pred-Post: 72 %
FEV1-%Pred-Pre: 49 %
FEV1-Post: 1.59 L
FEV1-Pre: 1.09 L
FEV1FVC-%Change-Post: 42 %
FEV1FVC-%Pred-Pre: 65 %
FEV6-%Change-Post: 3 %
FEV6-%Pred-Post: 79 %
FEV6-%Pred-Pre: 77 %
FEV6-Post: 2.22 L
FEV6-Pre: 2.14 L
FEV6FVC-%Change-Post: 1 %
FEV6FVC-%Pred-Post: 104 %
FEV6FVC-%Pred-Pre: 103 %
FVC-%Change-Post: 2 %
FVC-%Pred-Post: 76 %
FVC-%Pred-Pre: 74 %
FVC-Post: 2.22 L
FVC-Pre: 2.17 L
Post FEV1/FVC ratio: 71 %
Post FEV6/FVC ratio: 100 %
Pre FEV1/FVC ratio: 50 %
Pre FEV6/FVC Ratio: 99 %
RV % pred: 135 %
RV: 2.86 L
TLC % pred: 100 %
TLC: 4.92 L

## 2021-10-24 NOTE — Patient Instructions (Signed)
Full PFT Performed Today  

## 2021-10-24 NOTE — Progress Notes (Signed)
Full PFT Performed Today  

## 2021-10-25 ENCOUNTER — Other Ambulatory Visit: Payer: Self-pay | Admitting: Internal Medicine

## 2021-10-25 ENCOUNTER — Other Ambulatory Visit (HOSPITAL_BASED_OUTPATIENT_CLINIC_OR_DEPARTMENT_OTHER): Payer: Self-pay

## 2021-10-25 DIAGNOSIS — G35 Multiple sclerosis: Secondary | ICD-10-CM

## 2021-10-25 DIAGNOSIS — R5382 Chronic fatigue, unspecified: Secondary | ICD-10-CM

## 2021-10-25 MED ORDER — AMPHETAMINE-DEXTROAMPHETAMINE 20 MG PO TABS
20.0000 mg | ORAL_TABLET | Freq: Every day | ORAL | 0 refills | Status: DC
  Filled 2021-10-25 – 2021-10-29 (×2): qty 30, 30d supply, fill #0

## 2021-10-25 NOTE — Telephone Encounter (Signed)
Check [place] registry last filled 09/30/2021.Marland KitchenJohny [name]

## 2021-10-29 ENCOUNTER — Other Ambulatory Visit (HOSPITAL_BASED_OUTPATIENT_CLINIC_OR_DEPARTMENT_OTHER): Payer: Self-pay

## 2021-10-31 ENCOUNTER — Other Ambulatory Visit: Payer: Self-pay | Admitting: Internal Medicine

## 2021-11-01 LAB — MYCOBACTERIA,CULT W/FLUOROCHROME SMEAR
MICRO NUMBER:: 13581418
SMEAR:: NONE SEEN
SPECIMEN QUALITY:: ADEQUATE

## 2021-11-04 ENCOUNTER — Other Ambulatory Visit: Payer: Medicare Other

## 2021-11-04 ENCOUNTER — Encounter: Payer: Self-pay | Admitting: Internal Medicine

## 2021-11-04 ENCOUNTER — Telehealth: Payer: Self-pay | Admitting: Internal Medicine

## 2021-11-04 ENCOUNTER — Ambulatory Visit (INDEPENDENT_AMBULATORY_CARE_PROVIDER_SITE_OTHER): Payer: Medicare Other | Admitting: Internal Medicine

## 2021-11-04 VITALS — BP 104/64 | HR 88 | Temp 98.1°F | Ht 63.0 in | Wt 111.0 lb

## 2021-11-04 DIAGNOSIS — I75023 Atheroembolism of bilateral lower extremities: Secondary | ICD-10-CM

## 2021-11-04 DIAGNOSIS — Z8701 Personal history of pneumonia (recurrent): Secondary | ICD-10-CM

## 2021-11-04 DIAGNOSIS — J3800 Paralysis of vocal cords and larynx, unspecified: Secondary | ICD-10-CM | POA: Diagnosis not present

## 2021-11-04 DIAGNOSIS — R053 Chronic cough: Secondary | ICD-10-CM | POA: Diagnosis not present

## 2021-11-04 LAB — MYCOBACTERIA,CULT W/FLUOROCHROME SMEAR
MICRO NUMBER:: 13593575
SMEAR:: NONE SEEN
SPECIMEN QUALITY:: ADEQUATE

## 2021-11-04 LAB — NITRIC OXIDE: Nitric Oxide: 14

## 2021-11-04 NOTE — Telephone Encounter (Signed)
Emily  Pls send my note to her neurologist in [place], and Engineer, structural in [place]

## 2021-11-04 NOTE — Progress Notes (Signed)
[name] consult 10/25/20  71 year old female with chronic cough.  She is a longstanding patient of Dr. Christinia [name].  Since [id number] she has had multiple sclerosis.  She had a hip fracture in 2004 and after that she has used a walker.  In 2006 she had a EGD with 3 cm hiatal hernia.  She is always had a stridorous noise with deep inspiration.  She has a 15-year history of dysphagia.  2008 at esophagogram was normal.  In May 2022 she saw Dr. Melida [name] and was noticed to have bilateral vocal fold paralysis.  She was admitted 09/27/2020 with history of fever but also chronic cough for 4 months.  She had failed outpatient antibiotics and steroids.  CT chest showed some scattered groundglass opacities [personally visualized] particularly in the left lower lobe.  She had a blood gas 09/29/2020 without any hypercapnia.  She had a ESR of 44.  She had normal procalcitonin.  She had serology that was negative in July 2022.  During this time MRSA PCR, COVID and flu were negative.  Respiratory virus panel was also negative.  Blood cultures were no growth.  The urine Streptococcus and Legionella was negative in July 2022.  She was discharged on 09/29/2020 with outpatient follow-up with GI and pulmonary.  No antibiotics or steroids were prescribed.   At follow-up she presented with Dr. Lucio [name] 10/04/2020 and GI.  At this time she was already on Dexilant and Pepcid.  She underwent a swallow study on 10/15/2020 and it is unchanged compared to baseline.  He is able to swallow well without any aspiration using compensatory mechanisms.   She then presented via the ED on 10/23/2020 because of continued worsening of cough.  She also reports chronic bronchorrhea.  She coughs at least 10 times sputum and each time quite a bit.  It is clear in color.  Has never been measured in the sputum cup.  She complained of ongoing cough and ongoing acid reflux.  [GI endoscopy is pending at this time] she had a chest x-ray in the ED the  representative slice it is worsening compared to 1 month ago.  No CT scan available.  No leukocytosis no fever no hypoxemia.  Cough is worse when she lies flat.  EDP started her on a short course steroid orally.   She was then admitted by the hospitalist who is now consulted [place].  She is frustrated by her continued symptoms.   Noted that she has baseline anemia.  Her hemoglobin in May 2022 was 11.9 g%.  But July dropped to 10.2 g%.  And currently 7.3 g%.  She has worsening anemia. Results for DEISI, SALONGA (MRN [id number]) as of 10/24/2020 15:13   Ref. Range 09/27/2020 10:49 10/23/2020 13:28  Eosinophils Absolute Latest Ref Range: 0.0 - 0.5 K/uL 0.1 0.1   10/23/2020 -  8/3 CT chest - LLL consolidation and lingular infilktrare worse. Rt side infiltrates eter  8/4 - still with cough. D/w Dr Kayla [name] s - has mild insp stridor at rest due to bialteral VC paralysis. CT chest yesreday as above. PCT  < 0.1 ESR 80. Currently on pred 46m per day   OV NP 08/15/21  08/15/2021 Follow up : Asthma and Pneumonia  Patient returns for 1 month follow-up.  Over the last year patient has had recurrent episodes of pneumonia and bronchitis.  She was admitted in July 2022 for community-acquired pneumonia.  She was treated with IV antibiotics.  CT chest September 27, 2020 showed  groundglass opacities in the right lower lobe and left lower lobe concerning for multifocal pneumonia.  She was readmitted in August 2022.  CT chest showed groundglass attenuation and waxing and waning groundglass.  Autoimmune work-up was negative.  She underwent bronchoscopy with negative fungal and AFB.  BAL cultures were positive for rare Streptococcus constellatus.  Sed rate was elevated at 80.  Treated with Antibiotics and Steroids for possible recurrent CAP vs BOOP/COP. Cytology showed reactive bronchial cells and benign lung tissue.  She was referred to immunology.  Her Ocrevus therapy for MS was stopped due to recurrent infections.  She received 8  weeks of Rocephin.  And she is now on weekly IVIG per immunology.  Patient has recently been reestablished with a new neurologist and is undergoing new testing with MRI. Patient is on Trelegy inhaler for her underlying asthma.  Patient's main complaint is she has ongoing cough and congestion. Patient has longstanding chronic cough upper airway irritation.  She has been evaluated by ENT Kayla [name].  She has known vocal fold paralysis, felt this is most likely due to her underlying MS.  She has intermittent stridor with this. Patient underwent modified barium swallow July 26 fifth 2022 that showed dysphagia in the pharyngeal phase.  No overt aspiration. Last visit patient was recommended on cough control regimen with Robitussin, Tessalon and flutter valve.  Patient says she has good and bad days.  Always has an ongoing cough that is quite productive at times.  We also talked about aspiration precautions. Last office visit . Sputum culture and AFB was not adequate  Sputum Fungal + heavy candida . and later  Mycocbaterium mucogenicum CT chest Jul 29, 2021 showed mild consolidation in the right upper lobe and patchy infiltrates in the mid to lower lung field on the left.  Frothy debris in the trachea and left mainstem bronchus questionable aspiration pneumonia.  She was treated with 1 week of Diflucan .  Patient denies any fever, discolored mucus.  Appetite is at baseline. We discussed aspiration precautions..   TEST/EVENTS :  CT chest September 27, 2020 showed groundglass opacities in the right lower lobe and left lower lobe concerning for multifocal pneumonia.  14 x 10 mm nodular density in the right middle lobe Modified barium swallow was negative   CT chest on August 3 showed groundglass attenuation consolidation with waxing and waning groundglass attenuation.  Autoimmune work-up was negative.  Fungal cultures were negative.  AFB sputum was negative.  BAL cultures showed rare Streptococcus Constellatus.   (Sensitive to ceftriaxone, levofloxacin and vancomycin.   intermediate to penicillin).  Sed rate was elevated at 80. Cytology showed reactive bronchial cells.  Benign lung tissue.    August 2022.  CT chest showed groundglass attenuation and waxing waning groundglass.  Autoimmune work-up was negative.  Fungal cultures AFB sputum was negative.  BAL cultures showed rare Streptococcus  Constellatus, sed rate was 80.  Cytology showed reactive bronchial cells and benign lung tissue.  Patient was referred to immunology   ENT consult 09/2015 -Redmond [name] - Vocal folds are immobile and in paramedian position. They are approximated such that voice comes out clearly but she has inspiratory stridor (ENT note - felt secondary most likely to MS    modified barium swallow July 26 fifth 2022 that showed dysphagia in the pharyngeal phase.  No overt aspiration.   CT chest Jul 29, 2021 mild apical scarring, mild consolidation in the medial aspect of the right upper lobe, patchy infiltrates in the mid  to lower left lung.   May 21, 2021 eosinophils 100   ID Consult June 2023  # Sputum cx positive for M mucogenicum/phocaicum # COPD, Cough Variant Asthma # Multiple sclerosis with associated B/L Vocal Cord Paralysis   09/28/20 respiratory cx normal respiratory flora 10/26/20 AFB smear and cx negative, strep constellatus , candida albicans ( tx with 8 weeks of IV ceftriaxone) 07/19/21 AFB smear and cx M mucogenicum/phocaicum, candida albicans ( tx 7 days of Fluconazole)   Does not meet criteria for Pulmonary NTM infection and hence, no indication to treat  OV 11/04/2021  Subjective:  Patient ID: Kayla [name], female , DOB: 08-16-1950 , age 83 y.o. , MRN: [id number] , ADDRESS: [place] [id number] PCP Janith Lima, MD Patient Care Team: Janith Lima, MD as PCP - General (Internal Medicine) Delphia Grates, MD (Neurology) Kayla Quitter, MD (Otolaryngology) Rosiland Oz, MD as  Consulting Physician (Infectious Diseases)  This Provider for this visit: Treatment Team:  Attending Provider: Brand Males, MD    11/04/2021 -  Transfer of care from Dr Melvyn Novas to Dr Chase Caller Chief Complaint  Patient presents with   Follow-up    Pt states she is about the same since last visit. States she coughs all throughout the day and will cough up phlegm that ranges from white to yellow in color. States that she also has occasional wheezing.     HPI TAYA ASHBAUGH 71 y.o. - she has bilateral vocal cord paralysius Follows with DR Redmond [name]. DEcades ago has seen Dr Lorenza Cambridge at [facility] who recommended adipose injection but she did not enjoy the interaction. Cough been going on for many years. Ws on gbapntin for neuropathy for many years and stopped this year but this did not make any difference in cough. She has had GI eval . She has hx of potential aspiration. She has hx of PNA. LAst CT May 2023. She had PFT - she says they told her id c/w asthma but in my view  it is cw vocal cord paralysis. She has not had allergy testing. Feno is normal.  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   For her MS she is off therapy. -0  - [facility] doc [facility]   She says she is  immune suppresed and is on IvIG - Dr Deneen Harts In [place]     Dr Lorenza Cambridge Reflux Symptom Index (> 13-15 suggestive of LPR cough) 0 -> 5  =  none ->severe problem 11/04/2021   Hoarseness of problem with voice 3  Clearing  Of Throat 3  Excess throat mucus or feeling of post nasal drip 5  Difficulty swallowing food, liquid or tablets 5  Cough after eating or lying down 5  Breathing difficulties or choking episodes 5  Troublesome or annoying cough 5  Sensation of something sticking in throat or lump in throat 5  Heartburn, chest pain, indigestion, or stomach acid coming up -  TOTAL 36     FENO  Lab Results  Component Value Date   NITRICOXIDE 14 11/04/2021      PFT      Latest Ref Rng & Units 10/24/2021    3:48 PM  PFT Results  FVC-Pre L 2.17  P  FVC-Predicted Pre % 74  P  FVC-Post L 2.22  P  FVC-Predicted Post % 76  P  Pre FEV1/FVC % % 50  P  Post FEV1/FCV % % 71  P  FEV1-Pre L 1.09  P  FEV1-Predicted Pre % 49  P  FEV1-Post L 1.59  P  DLCO uncorrected ml/min/mmHg 11.43  P  DLCO UNC% % 60  P  DLCO corrected ml/min/mmHg 11.43  P  DLCO COR %Predicted % 60  P  DLVA Predicted % 83  P  TLC L 4.92  P  TLC % Predicted % 100  P  RV % Predicted % 135  P    P Preliminary result       has a past medical history of Arthritis, Cervical cancer ([place]) (2005), COPD (chronic obstructive pulmonary disease) ([place]), Multiple sclerosis ([place]), Multiple sclerosis ([place]) (1996), Neuromuscular disorder ([place]), Osteoporosis, Other diseases of vocal cords, Other voice and resonance disorders, Pneumonia (2014, 1992), Sleep apnea, and Unspecified hypothyroidism.   reports that she has never smoked. She has never used smokeless tobacco.  Past Surgical History:  Procedure Laterality Date   ABDOMINAL HYSTERECTOMY  03/25/2003   for cervical cancer, complete   BRONCHIAL BIOPSY  10/26/2020   Procedure: BRONCHIAL BIOPSIES;  Surgeon: Garner Nash, DO;  Location: [facility];  Service: Pulmonary;;   BRONCHIAL BRUSHINGS  10/26/2020   Procedure: BRONCHIAL BRUSHINGS;  Surgeon: Garner Nash, DO;  Location: [facility];  Service: Pulmonary;;   BRONCHIAL WASHINGS  10/26/2020   Procedure: BRONCHIAL WASHINGS;  Surgeon: Garner Nash, DO;  Location: [facility];  Service: Pulmonary;;   CATARACT EXTRACTION, BILATERAL Bilateral    lens for cataracts   CONVERSION TO TOTAL HIP Right 09/18/2015   Procedure: CONVERSION OF RIGHT HEMI ARTHROPLASTY TO TOTAL HIP ARTHROPLASTY ACETABULAR REVISON ;  Surgeon: Paralee Cancel, MD;  Location: [facility];  Service: Orthopedics;  Laterality: Right;   HIP ARTHROPLASTY Right 03/01/2013   Procedure: ARTHROPLASTY BIPOLAR HIP;  Surgeon: Mauri Pole,  MD;  Location: [facility];  Service: Orthopedics;  Laterality: Right;   HIP CLOSED REDUCTION Right 03/12/2013   Procedure: CLOSED REDUCTION HIP;  Surgeon: Gearlean Alf, MD;  Location: [facility];  Service: Orthopedics;  Laterality: Right;   HIP CLOSED REDUCTION Right 03/19/2013   Procedure: CLOSED REDUCTION HIP;  Surgeon: Johnn Hai, MD;  Location: [facility];  Service: Orthopedics;  Laterality: Right;   HIP CLOSED REDUCTION Right 11/10/2015   Procedure: CLOSED MANIPULATION HIP;  Surgeon: Rod Can, MD;  Location: [facility];  Service: Orthopedics;  Laterality: Right;   HIP CLOSED REDUCTION Right 06/05/2017   Procedure: CLOSED REDUCTION HIP;  Surgeon: [name], MD;  Location: [facility];  Service: Orthopedics;  Laterality: Right;   SMALL INTESTINE SURGERY     TOTAL HIP REVISION Right 06/06/2017   Procedure: TOTAL HIP REVISION;  Surgeon: [name], MD;  Location: [facility];  Service: Orthopedics;  Laterality: Right;   VESICOVAGINAL FISTULA CLOSURE W/ TAH  03/25/2003   VIDEO BRONCHOSCOPY N/A 10/26/2020   Procedure: VIDEO BRONCHOSCOPY WITH FLUORO;  Surgeon: Garner Nash, DO;  Location: [facility];  Service: Pulmonary;  Laterality: N/A;    Allergies  Allergen Reactions   Tramadol     hallucinations    Contrast Media [Iodinated Contrast Media] Rash   Iohexol Hives   Morphine And Related Other (See Comments)    Reaction:  Hallucinations    Methylprednisolone Other (See Comments)    Reaction:  Decreases pts heart rate    Oxycodone-Acetaminophen Rash and Other (See Comments)    Reaction:  Hallucinations    Phenytoin Sodium Extended Rash   Zanaflex [Tizanidine Hydrochloride] Rash    Immunization History  Administered Date(s) Administered   DTP 04/10/2009  Fluad Quad(high Dose 65+) 12/29/2018   Influenza Split 12/10/2010, 02/11/2012, 12/22/2012, 01/02/2014, 12/22/2014   Influenza Whole 02/19/2005, 03/28/2009, 01/23/2016   Influenza, High Dose Seasonal PF 02/23/2018    Influenza,inj,Quad PF,6+ Mos 02/10/2017   Influenza-Unspecified 12/02/2018, 01/08/2019, 12/22/2019, 01/15/2021, 02/05/2021   Moderna Covid-19 Vaccine Bivalent Booster 49yr & up 02/05/2021   Moderna Sars-Covid-2 Vaccination 04/29/2019, 06/01/2019, 01/19/2020, 08/14/2020   Pneumococcal Conjugate-13 11/06/2015   Pneumococcal Polysaccharide-23 03/28/2009, 04/26/2019   Tdap 04/26/2019   Zoster Recombinat (Shingrix) 09/07/2019, 02/17/2020    Family History  Problem Relation Age of Onset   Other Mother        COVID   Colon cancer Father    Atrial fibrillation Brother    Heart attack Maternal Grandfather    Parkinson's disease Paternal Grandfather    Esophageal cancer Neg Hx    Stomach cancer Neg Hx    Rectal cancer Neg Hx      Current Outpatient Medications:    albuterol (VENTOLIN HFA) 108 (90 Base) MCG/ACT inhaler, INHALE 2 PUFFS INTO LUNGS EVERY 6 HOURS AS NEEDED., Disp: 18 g, Rfl: 0   amphetamine-dextroamphetamine (ADDERALL) 20 MG tablet, Take 1 tablet (20 mg total) by mouth daily., Disp: 30 tablet, Rfl: 0   baclofen (LIORESAL) 20 MG tablet, Take 20 mg by mouth 3 (three) times daily as needed for muscle spasms., Disp: , Rfl:    CALCIUM PO, Take 1 tablet by mouth daily., Disp: , Rfl:    dexlansoprazole (DEXILANT) 60 MG capsule, TAKE 1 CAPSULE BY MOUTH  ONCE DAILY WITH BREAKFAST, Disp: 90 capsule, Rfl: 3   famotidine (PEPCID) 20 MG tablet, One after supper, Disp: 30 tablet, Rfl: 11   Ferric Maltol (ACCRUFER) 30 MG CAPS, Take 1 capsule by mouth in the morning and at bedtime., Disp: 180 capsule, Rfl: 0   FLUoxetine (PROZAC) 40 MG capsule, Take 40 mg by mouth daily., Disp: , Rfl:    furosemide (LASIX) 20 MG tablet, TAKE 1 TABLET BY MOUTH  DAILY, Disp: 90 tablet, Rfl: 3   Immune Globulin, Human,-klhw (XEMBIFY [place]), Inject into the skin., Disp: , Rfl:    levothyroxine (SYNTHROID) 100 MCG tablet, TAKE 1 TABLET BY MOUTH ONCE DAILY BEFORE BREAKFAST, Disp: 90 tablet, Rfl: 0   Multiple Vitamin  (MULTIVITAMIN WITH MINERALS) TABS tablet, Take 1 tablet by mouth daily., Disp: , Rfl:    pilocarpine (SALAGEN) 7.5 MG tablet, Take 7.5 mg by mouth 2 (two) times daily., Disp: , Rfl:    Probiotic Product (PROBIOTIC PO), Take 1 capsule by mouth daily. Harmony probiotic, Disp: , Rfl:    Respiratory Therapy Supplies (FLUTTER) DEVI, Use as directed, Disp: 1 each, Rfl: 0   tamsulosin (FLOMAX) 0.4 MG CAPS capsule, Take 0.4 mg by mouth daily., Disp: , Rfl:    temazepam (RESTORIL) 15 MG capsule, Take 1 capsule (15 mg total) by mouth at bedtime., Disp: 30 capsule, Rfl: 2   TRELEGY ELLIPTA 100-62.5-25 MCG/ACT AEPB, Inhale 1 puff into the lungs daily., Disp: 120 each, Rfl: 1   trimethoprim (TRIMPEX) 100 MG tablet, Take 100 mg by mouth daily., Disp: , Rfl:       Objective:   Vitals:   11/04/21 1540  BP: 104/64  Pulse: 88  Temp: 98.1 F (36.7 C)  TempSrc: Oral  SpO2: 100%  Weight: 111 lb (50.3 kg)  Height: _0  (1.6 m)    Estimated body mass index is 19.66 kg/m as calculated from the following:   Height as of this encounter: _1  (1.6 m).  Weight as of this encounter: 111 lb (50.3 kg).  _0 @  Filed Weights   11/04/21 1540  Weight: 111 lb (50.3 kg)     Physical Exam   General: No distress. Has walker. Very mild insprioaty stridor when informed to take deep breath Neuro: Alert and Oriented x 3. GCS 15. Speech normal. HAS WALKER. HOARSE VOICE Psych: Pleasant Resp:  Barrel Chest - no.  Wheeze - no, Crackles - no, No overt respiratory distress CVS: Normal heart sounds. Murmurs - no Ext: Stigmata of Connective Tissue Disease - no HEENT: Normal upper airway. PEERL +. No post nasal drip        Assessment:       ICD-10-CM   1. Chronic cough  R05.3 Nitric oxide    IgE    Resp Allergy Profile Regn2DC DE MD [facility]    CT Chest Wo Contrast    Ambulatory referral to ENT    2. Vocal cord paralysis  J38.00     3. History of pneumonia  Z87.01          Plan:      Patient Instructions     ICD-10-CM   1. Chronic cough  R05.3 Nitric oxide    2. Vocal cord paralysis  J38.00     3. History of pneumonia  Z87.01       Chronic multi-year problem PFT moore consistent with vocal cord paralysis Vocal cord paralysis playing a big role  Probably underlyig cough neuropathy and atrophy   - noted years ago DR Lorenza Cambridge recommended fat injections to vocal cord  Plan  - do blood IgE and allergy test  - wil inform of results when available - do followup C chest without contrast in OCt/Nov 2023 for followup infiltrates fom May 2023 -refer Dr Dennison Mascot Lamount Cohen t [facility]  Followup  - Oct/Nov 2023 after CT chest    SIGNATURE    Dr. Brand Males, M.D., F.C.C.P,  Pulmonary and Critical Care Medicine Staff Physician, [place] Director - Interstitial Lung Disease  Program  Pulmonary [place] at [place], [place], [address]  Pager: [phone], If no answer or between  15:00h - 7:00h: call [phone] Telephone: [phone]  6:06 PM 11/04/2021

## 2021-11-04 NOTE — Patient Instructions (Addendum)
ICD-10-CM   1. Chronic cough  R05.3 Nitric oxide    2. Vocal cord paralysis  J38.00     3. History of pneumonia  Z87.01       Chronic multi-year problem PFT moore consistent with vocal cord paralysis Vocal cord paralysis playing a big role  Probably underlyig cough neuropathy and atrophy   - noted years ago DR Kouffman recommended fat injections to vocal cord  Plan  - do blood IgE and allergy test  - wil inform of results when available - do followup C chest without contrast in OCt/Nov 2023 for followup infiltrates fom May 2023 -refer Dr STephen Carter Wrigh t [facility]  Followup  - Oct/Nov 2023 after CT chest 

## 2021-11-05 NOTE — Telephone Encounter (Signed)
Licthenberger the immunologist and Nurse, children[facility].  Paper given to you.  Please add them as providers in epic

## 2021-11-05 NOTE — Telephone Encounter (Signed)
Have added both doctors to pt's care team. OV notes have been printed to be faxed to both doctors. Nothing further needed.

## 2021-11-05 NOTE — Telephone Encounter (Signed)
Do you have the name of both providers so I can look up their fax number?

## 2021-11-06 LAB — RESPIRATORY ALLERGY PROFILE REGION II ~~LOC~~

## 2021-11-06 LAB — INTERPRETATION:

## 2021-11-06 NOTE — Progress Notes (Signed)
Blood allergy test Is normal . Probablity she has asthma is down . Keep appt with Dr Wanita Chamberlain at [facility].

## 2021-11-07 LAB — MYCOBACTERIA,CULT W/FLUOROCHROME SMEAR
MICRO NUMBER:: 13606281
SMEAR:: NONE SEEN
SPECIMEN QUALITY:: ADEQUATE

## 2021-11-11 DIAGNOSIS — S42211D Unspecified displaced fracture of surgical neck of right humerus, subsequent encounter for fracture with routine healing: Secondary | ICD-10-CM | POA: Diagnosis not present

## 2021-11-11 DIAGNOSIS — R296 Repeated falls: Secondary | ICD-10-CM | POA: Diagnosis not present

## 2021-11-11 DIAGNOSIS — R260 Ataxic gait: Secondary | ICD-10-CM | POA: Diagnosis not present

## 2021-11-11 DIAGNOSIS — G35 Multiple sclerosis: Secondary | ICD-10-CM | POA: Diagnosis not present

## 2021-11-13 DIAGNOSIS — R296 Repeated falls: Secondary | ICD-10-CM | POA: Diagnosis not present

## 2021-11-13 DIAGNOSIS — G35 Multiple sclerosis: Secondary | ICD-10-CM | POA: Diagnosis not present

## 2021-11-13 DIAGNOSIS — S42211D Unspecified displaced fracture of surgical neck of right humerus, subsequent encounter for fracture with routine healing: Secondary | ICD-10-CM | POA: Diagnosis not present

## 2021-11-13 DIAGNOSIS — R260 Ataxic gait: Secondary | ICD-10-CM | POA: Diagnosis not present

## 2021-11-18 DIAGNOSIS — S42211D Unspecified displaced fracture of surgical neck of right humerus, subsequent encounter for fracture with routine healing: Secondary | ICD-10-CM | POA: Diagnosis not present

## 2021-11-18 DIAGNOSIS — R260 Ataxic gait: Secondary | ICD-10-CM | POA: Diagnosis not present

## 2021-11-18 DIAGNOSIS — G35 Multiple sclerosis: Secondary | ICD-10-CM | POA: Diagnosis not present

## 2021-11-18 DIAGNOSIS — R296 Repeated falls: Secondary | ICD-10-CM | POA: Diagnosis not present

## 2021-11-19 ENCOUNTER — Ambulatory Visit: Payer: Medicare Other | Admitting: Internal Medicine

## 2021-11-20 DIAGNOSIS — R296 Repeated falls: Secondary | ICD-10-CM | POA: Diagnosis not present

## 2021-11-20 DIAGNOSIS — R260 Ataxic gait: Secondary | ICD-10-CM | POA: Diagnosis not present

## 2021-11-20 DIAGNOSIS — G35 Multiple sclerosis: Secondary | ICD-10-CM | POA: Diagnosis not present

## 2021-11-20 DIAGNOSIS — S42211D Unspecified displaced fracture of surgical neck of right humerus, subsequent encounter for fracture with routine healing: Secondary | ICD-10-CM | POA: Diagnosis not present

## 2021-11-27 ENCOUNTER — Ambulatory Visit (INDEPENDENT_AMBULATORY_CARE_PROVIDER_SITE_OTHER): Payer: Medicare Other | Admitting: Internal Medicine

## 2021-11-27 ENCOUNTER — Ambulatory Visit (INDEPENDENT_AMBULATORY_CARE_PROVIDER_SITE_OTHER): Payer: Medicare Other

## 2021-11-27 ENCOUNTER — Encounter: Payer: Self-pay | Admitting: Internal Medicine

## 2021-11-27 ENCOUNTER — Other Ambulatory Visit: Payer: Self-pay | Admitting: Internal Medicine

## 2021-11-27 VITALS — BP 112/80 | HR 56 | Temp 98.3°F | Wt 112.0 lb

## 2021-11-27 DIAGNOSIS — R053 Chronic cough: Secondary | ICD-10-CM | POA: Diagnosis not present

## 2021-11-27 DIAGNOSIS — E038 Other specified hypothyroidism: Secondary | ICD-10-CM

## 2021-11-27 DIAGNOSIS — R059 Cough, unspecified: Secondary | ICD-10-CM | POA: Diagnosis not present

## 2021-11-27 DIAGNOSIS — R5382 Chronic fatigue, unspecified: Secondary | ICD-10-CM

## 2021-11-27 DIAGNOSIS — G35 Multiple sclerosis: Secondary | ICD-10-CM

## 2021-11-27 DIAGNOSIS — K5904 Chronic idiopathic constipation: Secondary | ICD-10-CM | POA: Diagnosis not present

## 2021-11-27 DIAGNOSIS — I75023 Atheroembolism of bilateral lower extremities: Secondary | ICD-10-CM

## 2021-11-27 DIAGNOSIS — J449 Chronic obstructive pulmonary disease, unspecified: Secondary | ICD-10-CM | POA: Diagnosis not present

## 2021-11-27 DIAGNOSIS — R0602 Shortness of breath: Secondary | ICD-10-CM | POA: Diagnosis not present

## 2021-11-27 LAB — CBC WITH DIFFERENTIAL/PLATELET
Basophils Absolute: 0.1 10*3/uL (ref 0.0–0.1)
Basophils Relative: 0.6 % (ref 0.0–3.0)
Eosinophils Absolute: 0.1 10*3/uL (ref 0.0–0.7)
Eosinophils Relative: 1 % (ref 0.0–5.0)
HCT: 40.7 % (ref 36.0–46.0)
Hemoglobin: 13.5 g/dL (ref 12.0–15.0)
Lymphocytes Relative: 15.1 % (ref 12.0–46.0)
Lymphs Abs: 1.3 10*3/uL (ref 0.7–4.0)
MCHC: 33.1 g/dL (ref 30.0–36.0)
MCV: 91.5 fl (ref 78.0–100.0)
Monocytes Absolute: 0.6 10*3/uL (ref 0.1–1.0)
Monocytes Relative: 7.2 % (ref 3.0–12.0)
Neutro Abs: 6.6 10*3/uL (ref 1.4–7.7)
Neutrophils Relative %: 76.1 % (ref 43.0–77.0)
Platelets: 312 10*3/uL (ref 150.0–400.0)
RBC: 4.45 Mil/uL (ref 3.87–5.11)
RDW: 13.8 % (ref 11.5–15.5)
WBC: 8.6 10*3/uL (ref 4.0–10.5)

## 2021-11-27 LAB — BASIC METABOLIC PANEL
BUN: 14 mg/dL (ref 6–23)
CO2: 29 mEq/L (ref 19–32)
Calcium: 9.9 mg/dL (ref 8.4–10.5)
Chloride: 96 mEq/L (ref 96–112)
Creatinine, Ser: 0.79 mg/dL (ref 0.40–1.20)
GFR: 76.36 mL/min (ref >60.00)
Glucose, Bld: 93 mg/dL (ref 70–99)
Potassium: 4.5 mEq/L (ref 3.5–5.1)
Sodium: 134 mEq/L — ABNORMAL LOW (ref 135–145)

## 2021-11-27 LAB — HEPATIC FUNCTION PANEL
ALT: 17 U/L (ref 0–35)
AST: 34 U/L (ref 0–37)
Albumin: 4.1 g/dL (ref 3.5–5.2)
Alkaline Phosphatase: 81 U/L (ref 39–117)
Bilirubin, Direct: 0.1 mg/dL (ref 0.0–0.3)
Total Bilirubin: 0.4 mg/dL (ref 0.2–1.2)
Total Protein: 7.5 g/dL (ref 6.0–8.3)

## 2021-11-27 LAB — TSH: TSH: 2.12 u[IU]/mL (ref 0.35–5.50)

## 2021-11-27 MED ORDER — HYDROCODONE BIT-HOMATROP MBR 5-1.5 MG/5ML PO SOLN: 5.0000 mL | Freq: Three times a day (TID) | ORAL | 0 refills | Status: DC | PRN

## 2021-11-27 NOTE — Progress Notes (Unsigned)
Subjective:  Patient ID: Kayla [name], female    DOB: 11-10-1950  Age: 71 y.o. MRN: [id number]  CC: Cough   HPI Kayla [name] presents for f/up -   She complains of cough that keeps her awake and affects her QOL. OTC products have not helped. The cough is productive of brown mucous.  Outpatient Medications Prior to Visit  Medication Sig Dispense Refill   albuterol (VENTOLIN HFA) 108 (90 Base) MCG/ACT inhaler INHALE 2 PUFFS INTO LUNGS EVERY 6 HOURS AS NEEDED. 18 g 0   amphetamine-dextroamphetamine (ADDERALL) 20 MG tablet Take 1 tablet (20 mg total) by mouth daily. 30 tablet 0   baclofen (LIORESAL) 20 MG tablet Take 20 mg by mouth 3 (three) times daily as needed for muscle spasms.     CALCIUM PO Take 1 tablet by mouth daily.     dexlansoprazole (DEXILANT) 60 MG capsule TAKE 1 CAPSULE BY MOUTH  ONCE DAILY WITH BREAKFAST 90 capsule 3   famotidine (PEPCID) 20 MG tablet One after supper 30 tablet 11   Ferric Maltol (ACCRUFER) 30 MG CAPS Take 1 capsule by mouth in the morning and at bedtime. 180 capsule 0   FLUoxetine (PROZAC) 40 MG capsule Take 40 mg by mouth daily.     furosemide (LASIX) 20 MG tablet TAKE 1 TABLET BY MOUTH  DAILY 90 tablet 3   Immune Globulin, Human,-klhw (XEMBIFY [place]) Inject into the skin.     levothyroxine (SYNTHROID) 100 MCG tablet TAKE 1 TABLET BY MOUTH ONCE DAILY BEFORE BREAKFAST 90 tablet 0   Multiple Vitamin (MULTIVITAMIN WITH MINERALS) TABS tablet Take 1 tablet by mouth daily.     pilocarpine (SALAGEN) 7.5 MG tablet Take 7.5 mg by mouth 2 (two) times daily.     Probiotic Product (PROBIOTIC PO) Take 1 capsule by mouth daily. Harmony probiotic     Respiratory Therapy Supplies (FLUTTER) DEVI Use as directed 1 each 0   tamsulosin (FLOMAX) 0.4 MG CAPS capsule Take 0.4 mg by mouth daily.     temazepam (RESTORIL) 15 MG capsule Take 1 capsule (15 mg total) by mouth at bedtime. 30 capsule 2   TRELEGY ELLIPTA 100-62.5-25 MCG/ACT AEPB Inhale 1 puff into the lungs daily.  120 each 1   trimethoprim (TRIMPEX) 100 MG tablet Take 100 mg by mouth daily.     No facility-administered medications prior to visit.    ROS Review of Systems  Constitutional:  Positive for fatigue. Negative for chills, diaphoresis and fever.  HENT: Negative.    Eyes: Negative.   Respiratory:  Positive for cough and shortness of breath. Negative for chest tightness and wheezing.   Cardiovascular:  Negative for chest pain, palpitations and leg swelling.  Gastrointestinal:  Positive for constipation. Negative for abdominal pain, diarrhea, nausea and vomiting.  Endocrine: Negative.   Genitourinary: Negative.  Negative for difficulty urinating.  Musculoskeletal: Negative.   Skin: Negative.   Neurological:  Negative for dizziness, weakness and headaches.  Hematological:  Negative for adenopathy. Does not bruise/bleed easily.  Psychiatric/Behavioral:  Positive for sleep disturbance. The patient is not nervous/anxious.     Objective:  BP 112/80 (BP Location: Left Arm, Patient Position: Sitting, Cuff Size: Normal)   Pulse (!) 56   Temp 98.3 F (36.8 C) (Oral)   Wt 112 lb (50.8 kg)   SpO2 96%   BMI 19.84 kg/m   BP Readings from Last 3 Encounters:  11/27/21 112/80  11/04/21 104/64  09/16/21 118/61    Wt Readings from Last 3  Encounters:  11/27/21 112 lb (50.8 kg)  11/04/21 111 lb (50.3 kg)  09/16/21 114 lb (51.7 kg)    Physical Exam Vitals reviewed.  Constitutional:      General: She is not in acute distress.    Appearance: She is ill-appearing. She is not toxic-appearing or diaphoretic.  HENT:     Nose: Nose normal.     Mouth/Throat:     Mouth: Mucous membranes are moist.  Eyes:     General: No scleral icterus.    Conjunctiva/sclera: Conjunctivae normal.  Cardiovascular:     Rate and Rhythm: Normal rate and regular rhythm.     Heart sounds: No murmur heard. Pulmonary:     Effort: Pulmonary effort is normal.     Breath sounds: No stridor. No wheezing, rhonchi or  rales.  Abdominal:     General: Abdomen is flat.     Palpations: There is no mass.     Tenderness: There is no abdominal tenderness. There is no guarding.     Hernia: No hernia is present.  Musculoskeletal:        General: Normal range of motion.     Cervical back: Neck supple.     Right lower leg: No edema.     Left lower leg: No edema.  Lymphadenopathy:     Cervical: No cervical adenopathy.  Skin:    General: Skin is warm and dry.  Neurological:     General: No focal deficit present.     Mental Status: She is alert.  Psychiatric:        Attention and Perception: Attention normal.     Lab Results  Component Value Date   WBC 8.6 11/27/2021   HGB 13.5 11/27/2021   HCT 40.7 11/27/2021   PLT 312.0 11/27/2021   GLUCOSE 93 11/27/2021   CHOL 262 (H) 07/26/2020   TRIG 55.0 07/26/2020   HDL 129.90 07/26/2020   LDLDIRECT 116.1 06/25/2010   LDLCALC 121 (H) 07/26/2020   ALT 17 11/27/2021   AST 34 11/27/2021   NA 134 (L) 11/27/2021   K 4.5 11/27/2021   CL 96 11/27/2021   CREATININE 0.79 11/27/2021   BUN 14 11/27/2021   CO2 29 11/27/2021   TSH 2.12 11/27/2021   INR 1.0 10/25/2020   HGBA1C 5.6 02/25/2013    MR BRAIN WO CONTRAST  Result Date: 08/20/2021 CLINICAL DATA:  Multiple sclerosis, follow-up EXAM: MRI HEAD WITHOUT CONTRAST TECHNIQUE: Multiplanar, multiecho pulse sequences of the brain and surrounding structures were obtained without intravenous contrast. COMPARISON:  September 2021 FINDINGS: Brain: Foci of T2 hyperintensity are again identified in the periventricular greater than subcortical supratentorial white matter. No definite new lesion. There is no acute infarction or intracranial hemorrhage. There is no intracranial mass, mass effect, or edema. There is no hydrocephalus or extra-axial fluid collection. Ventricles and sulci are stable in size and configuration. Vascular: Major vessel flow voids at the skull base are preserved. Skull and upper cervical spine: Normal  marrow signal is preserved. Sinuses/Orbits: Polypoid left maxillary sinus mucosal thickening. Bilateral lens replacements are unremarkable. Other: Sella is unremarkable.  Mastoid air cells are clear. IMPRESSION: No apparent change in burden of presumed chronic demyelination in the cerebral white matter. Electronically Signed   By: Macy Mis M.D.   On: 08/20/2021 10:30   DG Chest 2 View  Result Date: 11/27/2021 CLINICAL DATA:  Patient c/o persistent cough x 1 year 5 months and chronic SOB. Hx of pneumonia, COPD. Nonsmoker. EXAM: CHEST - 2 VIEW  COMPARISON:  06/25/2021. FINDINGS: Cardiac silhouette is normal in size. Normal mediastinal and hilar contours. Lungs are hyperexpanded, but clear. No pleural effusion or pneumothorax. Skeletal structures are intact. IMPRESSION: No active cardiopulmonary disease. Electronically Signed   By: Lajean Manes M.D.   On: 11/27/2021 11:09     Assessment & Plan:   Kamora was seen today for cough.  Diagnoses and all orders for this visit:  Hypothyroidism- She is euthyroid. -     TSH; Future -     Hepatic function panel; Future -     Hepatic function panel -     TSH  Persistent cough- CXR is negative for mass/infiltrate. -     DG Chest 2 View; Future -     Discontinue: HYDROcodone bit-homatropine (HYCODAN) 5-1.5 MG/5ML syrup; Take 5 mLs by mouth every 8 (eight) hours as needed for cough. -     Hepatic function panel; Future -     Hepatic function panel -     promethazine-dextromethorphan (PROMETHAZINE-DM) 6.25-15 MG/5ML syrup; Take 5 mLs by mouth 4 (four) times daily as needed for cough.  Chronic idiopathic constipation- Labs are negative for secondary causes. She is treating this with dulcolax. -     Basic metabolic panel; Future -     CBC with Differential/Platelet; Future -     TSH; Future -     Hepatic function panel; Future -     Hepatic function panel -     TSH -     CBC with Differential/Platelet -     Basic metabolic panel   I have  discontinued Leonetta H. Grewell's HYDROcodone bit-homatropine. I am also having her start on promethazine-dextromethorphan. Additionally, I am having her maintain her FLUoxetine, baclofen, multivitamin with minerals, Flutter, famotidine, tamsulosin, pilocarpine, Probiotic Product (PROBIOTIC PO), CALCIUM PO, albuterol, (Immune Globulin, Human,-klhw (XEMBIFY [place])), dexlansoprazole, furosemide, ACCRUFeR, Trelegy Ellipta, levothyroxine, amphetamine-dextroamphetamine, temazepam, and trimethoprim.  Meds ordered this encounter  Medications   DISCONTD: HYDROcodone bit-homatropine (HYCODAN) 5-1.5 MG/5ML syrup    Sig: Take 5 mLs by mouth every 8 (eight) hours as needed for cough.    Dispense:  240 mL    Refill:  0   promethazine-dextromethorphan (PROMETHAZINE-DM) 6.25-15 MG/5ML syrup    Sig: Take 5 mLs by mouth 4 (four) times daily as needed for cough.    Dispense:  240 mL    Refill:  0     Follow-up: Return in about 3 months (around 02/26/2022).  Scarlette Calico, MD

## 2021-11-27 NOTE — Patient Instructions (Signed)
Cough, Adult Coughing is a reflex that clears your throat and your airways (respiratory system). Coughing helps to heal and protect your lungs. It is normal to cough occasionally, but a cough that happens with other symptoms or lasts a long time may be a sign of a condition that needs treatment. An acute cough may only last 2-3 weeks, while a chronic cough may last 8 or more weeks. Coughing is commonly caused by: Infection of the respiratory systemby viruses or bacteria. Breathing in substances that irritate your lungs. Allergies. Asthma. Mucus that runs down the back of your throat (postnasal drip). Smoking. Acid backing up from the stomach into the esophagus (gastroesophageal reflux). Certain medicines. Chronic lung problems. Other medical conditions such as heart failure or a blood clot in the lung (pulmonary embolism). Follow these instructions at home: Medicines Take over-the-counter and prescription medicines only as told by your health care provider. Talk with your health care provider before you take a cough suppressant medicine. Lifestyle  Avoid cigarette smoke. Do not use any products that contain nicotine or tobacco, such as cigarettes, e-cigarettes, and chewing tobacco. If you need help quitting, ask your health care provider. Drink enough fluid to keep your urine pale yellow. Avoid caffeine. Do not drink alcohol if your health care provider tells you not to drink. General instructions  Pay close attention to changes in your cough. Tell your health care provider about them. Always cover your mouth when you cough. Avoid things that make you cough, such as perfume, candles, cleaning products, or campfire or tobacco smoke. If the air is dry, use a cool mist vaporizer or humidifier in your bedroom or your home to help loosen secretions. If your cough is worse at night, try to sleep in a semi-upright position. Rest as needed. Keep all follow-up visits as told by your health care  provider. This is important. Contact a health care provider if you: Have new symptoms. Cough up pus. Have a cough that does not get better after 2-3 weeks or gets worse. Cannot control your cough with cough suppressant medicines and you are losing sleep. Have pain that gets worse or pain that is not helped with medicine. Have a fever. Have unexplained weight loss. Have night sweats. Get help right away if: You cough up blood. You have difficulty breathing. Your heartbeat is very fast. These symptoms may represent a serious problem that is an emergency. Do not wait to see if the symptoms will go away. Get medical help right away. Call your local emergency services (911 in the [place] Do not drive yourself to the hospital. Summary Coughing is a reflex that clears your throat and your airways. It is normal to cough occasionally, but a cough that happens with other symptoms or lasts a long time may be a sign of a condition that needs treatment. Take over-the-counter and prescription medicines only as told by your health care provider. Always cover your mouth when you cough. Contact a health care provider if you have new symptoms or a cough that does not get better after 2-3 weeks or gets worse. This information is not intended to replace advice given to you by your health care provider. Make sure you discuss any questions you have with your health care provider. Document Revised: 03/29/2018 Document Reviewed: 03/29/2018 Elsevier Patient Education  2023 Elsevier Inc.  

## 2021-11-28 ENCOUNTER — Encounter: Payer: Self-pay | Admitting: Internal Medicine

## 2021-11-28 DIAGNOSIS — S42211D Unspecified displaced fracture of surgical neck of right humerus, subsequent encounter for fracture with routine healing: Secondary | ICD-10-CM | POA: Diagnosis not present

## 2021-11-28 DIAGNOSIS — R260 Ataxic gait: Secondary | ICD-10-CM | POA: Diagnosis not present

## 2021-11-28 DIAGNOSIS — G35 Multiple sclerosis: Secondary | ICD-10-CM | POA: Diagnosis not present

## 2021-11-28 DIAGNOSIS — R296 Repeated falls: Secondary | ICD-10-CM | POA: Diagnosis not present

## 2021-11-28 MED ORDER — PROMETHAZINE-DM 6.25-15 MG/5ML PO SYRP: 5.0000 mL | ORAL_SOLUTION | Freq: Four times a day (QID) | ORAL | 0 refills | Status: DC | PRN

## 2021-12-02 ENCOUNTER — Other Ambulatory Visit (HOSPITAL_BASED_OUTPATIENT_CLINIC_OR_DEPARTMENT_OTHER): Payer: Self-pay

## 2021-12-03 DIAGNOSIS — N130 Hydronephrosis with ureteropelvic junction obstruction: Secondary | ICD-10-CM | POA: Diagnosis not present

## 2021-12-03 DIAGNOSIS — R350 Frequency of micturition: Secondary | ICD-10-CM | POA: Diagnosis not present

## 2021-12-05 ENCOUNTER — Other Ambulatory Visit (HOSPITAL_BASED_OUTPATIENT_CLINIC_OR_DEPARTMENT_OTHER): Payer: Self-pay

## 2021-12-05 DIAGNOSIS — R296 Repeated falls: Secondary | ICD-10-CM | POA: Diagnosis not present

## 2021-12-05 DIAGNOSIS — R260 Ataxic gait: Secondary | ICD-10-CM | POA: Diagnosis not present

## 2021-12-05 DIAGNOSIS — G35 Multiple sclerosis: Secondary | ICD-10-CM | POA: Diagnosis not present

## 2021-12-05 DIAGNOSIS — S42211D Unspecified displaced fracture of surgical neck of right humerus, subsequent encounter for fracture with routine healing: Secondary | ICD-10-CM | POA: Diagnosis not present

## 2021-12-05 MED ORDER — AMPHETAMINE-DEXTROAMPHETAMINE 20 MG PO TABS
20.0000 mg | ORAL_TABLET | Freq: Every day | ORAL | 0 refills | Status: DC
  Filled 2021-12-05: qty 30, 30d supply, fill #0

## 2021-12-08 ENCOUNTER — Other Ambulatory Visit: Payer: Self-pay | Admitting: Internal Medicine

## 2021-12-08 DIAGNOSIS — D508 Other iron deficiency anemias: Secondary | ICD-10-CM

## 2021-12-09 DIAGNOSIS — G35 Multiple sclerosis: Secondary | ICD-10-CM | POA: Diagnosis not present

## 2021-12-09 DIAGNOSIS — S42211D Unspecified displaced fracture of surgical neck of right humerus, subsequent encounter for fracture with routine healing: Secondary | ICD-10-CM | POA: Diagnosis not present

## 2021-12-09 DIAGNOSIS — R296 Repeated falls: Secondary | ICD-10-CM | POA: Diagnosis not present

## 2021-12-09 DIAGNOSIS — R260 Ataxic gait: Secondary | ICD-10-CM | POA: Diagnosis not present

## 2021-12-09 MED ORDER — ACCRUFER 30 MG PO CAPS: 1.0000 | ORAL_CAPSULE | Freq: Two times a day (BID) | ORAL | 0 refills | Status: DC

## 2021-12-11 DIAGNOSIS — R260 Ataxic gait: Secondary | ICD-10-CM | POA: Diagnosis not present

## 2021-12-11 DIAGNOSIS — S42211D Unspecified displaced fracture of surgical neck of right humerus, subsequent encounter for fracture with routine healing: Secondary | ICD-10-CM | POA: Diagnosis not present

## 2021-12-11 DIAGNOSIS — G35 Multiple sclerosis: Secondary | ICD-10-CM | POA: Diagnosis not present

## 2021-12-11 DIAGNOSIS — R296 Repeated falls: Secondary | ICD-10-CM | POA: Diagnosis not present

## 2021-12-16 DIAGNOSIS — S42211D Unspecified displaced fracture of surgical neck of right humerus, subsequent encounter for fracture with routine healing: Secondary | ICD-10-CM | POA: Diagnosis not present

## 2021-12-16 DIAGNOSIS — R260 Ataxic gait: Secondary | ICD-10-CM | POA: Diagnosis not present

## 2021-12-16 DIAGNOSIS — R296 Repeated falls: Secondary | ICD-10-CM | POA: Diagnosis not present

## 2021-12-16 DIAGNOSIS — G35 Multiple sclerosis: Secondary | ICD-10-CM | POA: Diagnosis not present

## 2021-12-18 DIAGNOSIS — S42211D Unspecified displaced fracture of surgical neck of right humerus, subsequent encounter for fracture with routine healing: Secondary | ICD-10-CM | POA: Diagnosis not present

## 2021-12-18 DIAGNOSIS — R296 Repeated falls: Secondary | ICD-10-CM | POA: Diagnosis not present

## 2021-12-18 DIAGNOSIS — R260 Ataxic gait: Secondary | ICD-10-CM | POA: Diagnosis not present

## 2021-12-18 DIAGNOSIS — G35 Multiple sclerosis: Secondary | ICD-10-CM | POA: Diagnosis not present

## 2021-12-23 DIAGNOSIS — G35 Multiple sclerosis: Secondary | ICD-10-CM | POA: Diagnosis not present

## 2021-12-23 DIAGNOSIS — R260 Ataxic gait: Secondary | ICD-10-CM | POA: Diagnosis not present

## 2021-12-23 DIAGNOSIS — R296 Repeated falls: Secondary | ICD-10-CM | POA: Diagnosis not present

## 2021-12-23 DIAGNOSIS — S42211D Unspecified displaced fracture of surgical neck of right humerus, subsequent encounter for fracture with routine healing: Secondary | ICD-10-CM | POA: Diagnosis not present

## 2021-12-25 DIAGNOSIS — R260 Ataxic gait: Secondary | ICD-10-CM | POA: Diagnosis not present

## 2021-12-25 DIAGNOSIS — G35 Multiple sclerosis: Secondary | ICD-10-CM | POA: Diagnosis not present

## 2021-12-25 DIAGNOSIS — R296 Repeated falls: Secondary | ICD-10-CM | POA: Diagnosis not present

## 2021-12-25 DIAGNOSIS — S42211D Unspecified displaced fracture of surgical neck of right humerus, subsequent encounter for fracture with routine healing: Secondary | ICD-10-CM | POA: Diagnosis not present

## 2021-12-26 ENCOUNTER — Other Ambulatory Visit (HOSPITAL_BASED_OUTPATIENT_CLINIC_OR_DEPARTMENT_OTHER): Payer: Self-pay

## 2021-12-26 MED ORDER — AREXVY 120 MCG/0.5ML IM SUSR
INTRAMUSCULAR | 0 refills | Status: DC
Start: 2021-12-26 — End: 2022-02-27
  Filled 2021-12-26: qty 0.5, 1d supply, fill #0

## 2021-12-30 ENCOUNTER — Other Ambulatory Visit: Payer: Self-pay | Admitting: Internal Medicine

## 2021-12-30 DIAGNOSIS — S42211D Unspecified displaced fracture of surgical neck of right humerus, subsequent encounter for fracture with routine healing: Secondary | ICD-10-CM | POA: Diagnosis not present

## 2021-12-30 DIAGNOSIS — G35 Multiple sclerosis: Secondary | ICD-10-CM | POA: Diagnosis not present

## 2021-12-30 DIAGNOSIS — R296 Repeated falls: Secondary | ICD-10-CM | POA: Diagnosis not present

## 2021-12-30 DIAGNOSIS — R260 Ataxic gait: Secondary | ICD-10-CM | POA: Diagnosis not present

## 2022-01-01 DIAGNOSIS — S42211D Unspecified displaced fracture of surgical neck of right humerus, subsequent encounter for fracture with routine healing: Secondary | ICD-10-CM | POA: Diagnosis not present

## 2022-01-01 DIAGNOSIS — G35 Multiple sclerosis: Secondary | ICD-10-CM | POA: Diagnosis not present

## 2022-01-01 DIAGNOSIS — R296 Repeated falls: Secondary | ICD-10-CM | POA: Diagnosis not present

## 2022-01-01 DIAGNOSIS — R260 Ataxic gait: Secondary | ICD-10-CM | POA: Diagnosis not present

## 2022-01-06 DIAGNOSIS — S42211D Unspecified displaced fracture of surgical neck of right humerus, subsequent encounter for fracture with routine healing: Secondary | ICD-10-CM | POA: Diagnosis not present

## 2022-01-06 DIAGNOSIS — G35 Multiple sclerosis: Secondary | ICD-10-CM | POA: Diagnosis not present

## 2022-01-06 DIAGNOSIS — R260 Ataxic gait: Secondary | ICD-10-CM | POA: Diagnosis not present

## 2022-01-06 DIAGNOSIS — R296 Repeated falls: Secondary | ICD-10-CM | POA: Diagnosis not present

## 2022-01-09 ENCOUNTER — Encounter: Payer: Self-pay | Admitting: Internal Medicine

## 2022-01-10 ENCOUNTER — Ambulatory Visit (INDEPENDENT_AMBULATORY_CARE_PROVIDER_SITE_OTHER): Payer: Medicare Other

## 2022-01-10 ENCOUNTER — Ambulatory Visit (INDEPENDENT_AMBULATORY_CARE_PROVIDER_SITE_OTHER): Payer: Medicare Other | Admitting: Nurse Practitioner

## 2022-01-10 VITALS — BP 108/74 | HR 84 | Temp 97.9°F | Ht 63.0 in | Wt 113.1 lb

## 2022-01-10 DIAGNOSIS — R051 Acute cough: Secondary | ICD-10-CM

## 2022-01-10 DIAGNOSIS — R5383 Other fatigue: Secondary | ICD-10-CM

## 2022-01-10 DIAGNOSIS — J40 Bronchitis, not specified as acute or chronic: Secondary | ICD-10-CM | POA: Diagnosis not present

## 2022-01-10 DIAGNOSIS — J90 Pleural effusion, not elsewhere classified: Secondary | ICD-10-CM | POA: Diagnosis not present

## 2022-01-10 DIAGNOSIS — R059 Cough, unspecified: Secondary | ICD-10-CM | POA: Diagnosis not present

## 2022-01-10 LAB — CBC
HCT: 37.3 % (ref 36.0–46.0)
Hemoglobin: 12.4 g/dL (ref 12.0–15.0)
MCHC: 33.1 g/dL (ref 30.0–36.0)
MCV: 91.7 fl (ref 78.0–100.0)
Platelets: 218 10*3/uL (ref 150.0–400.0)
RBC: 4.07 Mil/uL (ref 3.87–5.11)
RDW: 14.2 % (ref 11.5–15.5)
WBC: 7.4 10*3/uL (ref 4.0–10.5)

## 2022-01-10 LAB — TSH: TSH: 2.81 u[IU]/mL (ref 0.35–5.50)

## 2022-01-10 LAB — COMPREHENSIVE METABOLIC PANEL
ALT: 28 U/L (ref 0–35)
AST: 41 U/L — ABNORMAL HIGH (ref 0–37)
Albumin: 3.7 g/dL (ref 3.5–5.2)
Alkaline Phosphatase: 81 U/L (ref 39–117)
BUN: 28 mg/dL — ABNORMAL HIGH (ref 6–23)
CO2: 25 mEq/L (ref 19–32)
Calcium: 9.4 mg/dL (ref 8.4–10.5)
Chloride: 94 mEq/L — ABNORMAL LOW (ref 96–112)
Creatinine, Ser: 1.13 mg/dL (ref 0.40–1.20)
GFR: 49.66 mL/min — ABNORMAL LOW (ref >60.00)
Glucose, Bld: 103 mg/dL — ABNORMAL HIGH (ref 70–99)
Potassium: 3.9 mEq/L (ref 3.5–5.1)
Sodium: 129 mEq/L — ABNORMAL LOW (ref 135–145)
Total Bilirubin: 0.4 mg/dL (ref 0.2–1.2)
Total Protein: 6.9 g/dL (ref 6.0–8.3)

## 2022-01-10 MED ORDER — DOXYCYCLINE HYCLATE 100 MG PO TABS: 100.0000 mg | ORAL_TABLET | Freq: Two times a day (BID) | ORAL | 0 refills | Status: DC

## 2022-01-10 MED ORDER — PREDNISONE 20 MG PO TABS: 40.0000 mg | ORAL_TABLET | Freq: Every day | ORAL | 0 refills | Status: DC

## 2022-01-10 NOTE — Progress Notes (Signed)
Established Patient Office Visit  Subjective   Patient ID: Kayla [name], female    DOB: 1950/12/19  Age: 71 y.o. MRN: [id number]  Chief Complaint  Patient presents with   Fatigue    Patient arrives for acute visit for the above. She is accompanied by her husband.  Has medical history of MS, hypothyroidism, COPD, sleep apnea, vocal cord paralysis. Reports she "has never felt this bad", symptoms started 3 days ago. Feels fatigued, has productive cough, muscle aches, headache, low-grade fever of 100.4, diarrhea. Reports chronic cough since 05/2020 with unknown etiology. Preparing to see pulmonologist at [facility] for further evaluation in the near future. Per previous pulmonologist thought is that her cough may be related to her vocal cord paralysis. She has had multiple pneumonia infections over the last year requiring hospitalization. This resulted in her Ocrevus for MS being discontinued and her being initiated on at home IVIG infusion to treat her compromised immunity.  Patient's husband reports she has not had an MS flare in over a decade.    Review of Systems  Constitutional:  Positive for fever (100.4) and malaise/fatigue.  HENT:  Positive for congestion.   Respiratory:  Positive for cough (chronic,no worse), sputum production (thick, white/yellow/brown) and shortness of breath (chronic, no worse).   Cardiovascular:  Negative for chest pain and palpitations.  Gastrointestinal:  Positive for abdominal pain (cramping) and diarrhea.  Musculoskeletal:  Positive for joint pain and myalgias.  Neurological:  Positive for headaches.      Objective:     BP 108/74   Pulse 84   Temp 97.9 F (36.6 C) (Oral)   Ht '5\' 3"'$  (1.6 m)   Wt 113 lb 2 oz (51.3 kg)   SpO2 98%   BMI 20.04 kg/m  BP Readings from Last 3 Encounters:  01/10/22 108/74  11/27/21 112/80  11/04/21 104/64   Wt Readings from Last 3 Encounters:  01/10/22 113 lb 2 oz (51.3 kg)  11/27/21 112 lb (50.8 kg)  11/04/21 111 lb  (50.3 kg)      Physical Exam Vitals reviewed.  Constitutional:      General: She is not in acute distress.    Appearance: Normal appearance.  HENT:     Head: Normocephalic and atraumatic.  Neck:     Vascular: No carotid bruit.  Cardiovascular:     Rate and Rhythm: Normal rate and regular rhythm.     Pulses: Normal pulses.     Heart sounds: Normal heart sounds.  Pulmonary:     Effort: Pulmonary effort is normal.     Breath sounds: Stridor (inspiratory - chronic) present. No decreased breath sounds, wheezing or rhonchi.  Skin:    General: Skin is warm and dry.  Neurological:     General: No focal deficit present.     Mental Status: She is alert and oriented to person, place, and time.  Psychiatric:        Mood and Affect: Mood normal.        Behavior: Behavior normal.        Judgment: Judgment normal.      No results found for any visits on 01/10/22.    The ASCVD Risk score (Arnett DK, et al., 2019) failed to calculate for the following reasons:   The valid HDL cholesterol range is 20 to 100 mg/dL    Assessment & Plan:   Problem List Items Addressed This Visit       Respiratory   Bronchitis - Primary  Acute. Has chronic cough, but the severity is stable.  Her main new symptom is fatigue.  Vital signs stable. I consulted with supervising physician, will treat with antibiotics, prednisone, and educated patient to follow-up if symptoms continue to persist.  Will check x-ray of chest, CBC, CMP, and TSH for further evaluation as well.  Did educate patient and husband that if symptoms do not improve with use of antibiotics and prednisone she should call her neurologist to see if this could represent a flare of her MS.  Patient encouraged to follow-up with ENT as scheduled next month for evaluation of possible vocal cord paralysis and to see if this is contributing to her chronic cough, they report their understanding.      Relevant Medications   doxycycline (VIBRA-TABS)  100 MG tablet   predniSONE (DELTASONE) 20 MG tablet   Other Relevant Orders   Comprehensive metabolic panel   CBC   TSH   DG Chest 2 View   Other Visit Diagnoses     Fatigue, unspecified type       Relevant Medications   doxycycline (VIBRA-TABS) 100 MG tablet   predniSONE (DELTASONE) 20 MG tablet   Other Relevant Orders   Comprehensive metabolic panel   CBC   TSH   DG Chest 2 View       Return if symptoms worsen or fail to improve.    Ailene Ards, NP

## 2022-01-10 NOTE — Assessment & Plan Note (Signed)
Acute. Has chronic cough, but the severity is stable.  Her main new symptom is fatigue.  Vital signs stable. I consulted with supervising physician, will treat with antibiotics, prednisone, and educated patient to follow-up if symptoms continue to persist.  Will check x-ray of chest, CBC, CMP, and TSH for further evaluation as well.  Did educate patient and husband that if symptoms do not improve with use of antibiotics and prednisone she should call her neurologist to see if this could represent a flare of her MS.  Patient encouraged to follow-up with ENT as scheduled next month for evaluation of possible vocal cord paralysis and to see if this is contributing to her chronic cough, they report their understanding.

## 2022-01-11 ENCOUNTER — Encounter: Payer: Self-pay | Admitting: Nurse Practitioner

## 2022-01-12 ENCOUNTER — Encounter: Payer: Self-pay | Admitting: Nurse Practitioner

## 2022-01-12 ENCOUNTER — Other Ambulatory Visit: Payer: Self-pay | Admitting: Nurse Practitioner

## 2022-01-12 DIAGNOSIS — R944 Abnormal results of kidney function studies: Secondary | ICD-10-CM

## 2022-01-12 DIAGNOSIS — R0602 Shortness of breath: Secondary | ICD-10-CM

## 2022-01-12 DIAGNOSIS — E871 Hypo-osmolality and hyponatremia: Secondary | ICD-10-CM

## 2022-01-12 DIAGNOSIS — R5383 Other fatigue: Secondary | ICD-10-CM

## 2022-01-12 NOTE — Progress Notes (Signed)
Follow-up labs for fatigue, shortness of breath, hyponatremia

## 2022-01-13 ENCOUNTER — Other Ambulatory Visit: Payer: Self-pay | Admitting: Nurse Practitioner

## 2022-01-13 DIAGNOSIS — J383 Other diseases of vocal cords: Secondary | ICD-10-CM | POA: Diagnosis not present

## 2022-01-13 DIAGNOSIS — J384 Edema of larynx: Secondary | ICD-10-CM | POA: Diagnosis not present

## 2022-01-13 DIAGNOSIS — E871 Hypo-osmolality and hyponatremia: Secondary | ICD-10-CM

## 2022-01-13 DIAGNOSIS — J3802 Paralysis of vocal cords and larynx, bilateral: Secondary | ICD-10-CM | POA: Diagnosis not present

## 2022-01-13 DIAGNOSIS — J387 Other diseases of larynx: Secondary | ICD-10-CM | POA: Diagnosis not present

## 2022-01-13 DIAGNOSIS — E785 Hyperlipidemia, unspecified: Secondary | ICD-10-CM

## 2022-01-13 DIAGNOSIS — R490 Dysphonia: Secondary | ICD-10-CM | POA: Diagnosis not present

## 2022-01-13 DIAGNOSIS — R053 Chronic cough: Secondary | ICD-10-CM | POA: Diagnosis not present

## 2022-01-14 ENCOUNTER — Other Ambulatory Visit: Payer: Self-pay | Admitting: Internal Medicine

## 2022-01-14 ENCOUNTER — Other Ambulatory Visit (INDEPENDENT_AMBULATORY_CARE_PROVIDER_SITE_OTHER): Payer: Medicare Other

## 2022-01-14 ENCOUNTER — Other Ambulatory Visit (HOSPITAL_BASED_OUTPATIENT_CLINIC_OR_DEPARTMENT_OTHER): Payer: Self-pay

## 2022-01-14 DIAGNOSIS — R5383 Other fatigue: Secondary | ICD-10-CM

## 2022-01-14 DIAGNOSIS — E871 Hypo-osmolality and hyponatremia: Secondary | ICD-10-CM | POA: Diagnosis not present

## 2022-01-14 DIAGNOSIS — R944 Abnormal results of kidney function studies: Secondary | ICD-10-CM

## 2022-01-14 DIAGNOSIS — R0602 Shortness of breath: Secondary | ICD-10-CM

## 2022-01-14 DIAGNOSIS — E785 Hyperlipidemia, unspecified: Secondary | ICD-10-CM

## 2022-01-14 DIAGNOSIS — G35 Multiple sclerosis: Secondary | ICD-10-CM

## 2022-01-14 DIAGNOSIS — R5382 Chronic fatigue, unspecified: Secondary | ICD-10-CM

## 2022-01-14 LAB — CORTISOL: Cortisol, Plasma: 17.7 ug/dL

## 2022-01-14 LAB — COMPREHENSIVE METABOLIC PANEL
ALT: 22 U/L (ref 0–35)
AST: 30 U/L (ref 0–37)
Albumin: 3.7 g/dL (ref 3.5–5.2)
Alkaline Phosphatase: 78 U/L (ref 39–117)
BUN: 22 mg/dL (ref 6–23)
CO2: 27 mEq/L (ref 19–32)
Calcium: 9.9 mg/dL (ref 8.4–10.5)
Chloride: 100 mEq/L (ref 96–112)
Creatinine, Ser: 0.85 mg/dL (ref 0.40–1.20)
GFR: 69.88 mL/min (ref >60.00)
Glucose, Bld: 82 mg/dL (ref 70–99)
Potassium: 3.9 mEq/L (ref 3.5–5.1)
Sodium: 137 mEq/L (ref 135–145)
Total Bilirubin: 0.3 mg/dL (ref 0.2–1.2)
Total Protein: 7 g/dL (ref 6.0–8.3)

## 2022-01-14 LAB — LIPID PANEL
Cholesterol: 169 mg/dL (ref 0–200)
HDL: 41.5 mg/dL (ref >39.00)
LDL Cholesterol: 96 mg/dL (ref 0–99)
NonHDL: 127.53
Total CHOL/HDL Ratio: 4
Triglycerides: 157 mg/dL — ABNORMAL HIGH (ref 0.0–149.0)
VLDL: 31.4 mg/dL (ref 0.0–40.0)

## 2022-01-14 LAB — URINALYSIS, ROUTINE W REFLEX MICROSCOPIC
Bilirubin Urine: NEGATIVE
Ketones, ur: NEGATIVE
Nitrite: NEGATIVE
Specific Gravity, Urine: <=1.005 — AB (ref 1.000–1.030)
Urine Glucose: NEGATIVE
Urobilinogen, UA: 0.2 (ref 0.0–1.0)
pH: 6 (ref 5.0–8.0)

## 2022-01-14 LAB — BRAIN NATRIURETIC PEPTIDE: Pro B Natriuretic peptide (BNP): 112 pg/mL — ABNORMAL HIGH (ref 0.0–100.0)

## 2022-01-14 MED ORDER — AMPHETAMINE-DEXTROAMPHETAMINE 20 MG PO TABS
20.0000 mg | ORAL_TABLET | Freq: Every day | ORAL | 0 refills | Status: DC
  Filled 2022-01-14: qty 30, 30d supply, fill #0

## 2022-01-16 ENCOUNTER — Ambulatory Visit (INDEPENDENT_AMBULATORY_CARE_PROVIDER_SITE_OTHER): Payer: Medicare Other | Admitting: Infectious Diseases

## 2022-01-16 ENCOUNTER — Other Ambulatory Visit: Payer: Self-pay

## 2022-01-16 ENCOUNTER — Ambulatory Visit (INDEPENDENT_AMBULATORY_CARE_PROVIDER_SITE_OTHER): Payer: Medicare Other | Admitting: Nurse Practitioner

## 2022-01-16 VITALS — BP 98/72 | HR 79 | Temp 98.4°F | Ht 63.0 in

## 2022-01-16 VITALS — BP 93/57 | HR 94 | Temp 98.8°F | Wt 115.8 lb

## 2022-01-16 DIAGNOSIS — R5383 Other fatigue: Secondary | ICD-10-CM | POA: Insufficient documentation

## 2022-01-16 DIAGNOSIS — J3802 Paralysis of vocal cords and larynx, bilateral: Secondary | ICD-10-CM | POA: Diagnosis not present

## 2022-01-16 DIAGNOSIS — G35 Multiple sclerosis: Secondary | ICD-10-CM

## 2022-01-16 DIAGNOSIS — E871 Hypo-osmolality and hyponatremia: Secondary | ICD-10-CM | POA: Diagnosis not present

## 2022-01-16 DIAGNOSIS — A310 Pulmonary mycobacterial infection: Secondary | ICD-10-CM | POA: Diagnosis not present

## 2022-01-16 DIAGNOSIS — R600 Localized edema: Secondary | ICD-10-CM | POA: Diagnosis not present

## 2022-01-16 DIAGNOSIS — R2243 Localized swelling, mass and lump, lower limb, bilateral: Secondary | ICD-10-CM | POA: Insufficient documentation

## 2022-01-16 LAB — BASIC METABOLIC PANEL
BUN: 23 mg/dL (ref 6–23)
CO2: 24 mEq/L (ref 19–32)
Calcium: 9.9 mg/dL (ref 8.4–10.5)
Chloride: 95 mEq/L — ABNORMAL LOW (ref 96–112)
Creatinine, Ser: 0.89 mg/dL (ref 0.40–1.20)
GFR: 66.12 mL/min (ref >60.00)
Glucose, Bld: 118 mg/dL — ABNORMAL HIGH (ref 70–99)
Potassium: 4.2 mEq/L (ref 3.5–5.1)
Sodium: 129 mEq/L — ABNORMAL LOW (ref 135–145)

## 2022-01-16 NOTE — Assessment & Plan Note (Signed)
Cardiac echocardiogram ordered for further evaluation.

## 2022-01-16 NOTE — Progress Notes (Signed)
Established Patient Office Visit  Subjective   Patient ID: Kayla [name], female    DOB: 04-Dec-1950  Age: 71 y.o. MRN: [id number]  Chief Complaint  Patient presents with   hyponatremia  Patient arrives for close follow-up accompanied by husband today.  Patient was seen by myself approximately 1 week ago for acute visit.  Symptoms that time were fatigue, productive cough, muscle aches, headache, low-grade fever 100.4, and diarrhea.  Work-up identified hyponatremia with sodium of 129, GFR of 49.66 previous GFR 76.36, sign of consolidation on chest x-ray, signs of UTI on urinalysis.  She was originally treated with prednisone course of doxycycline.  She came back to the office approximately 4 days later for follow-up labs which identified mild hypertriglyceridemia at 157, elevated BNP at 112, normal plasma cortisol at 17.7 (patient held prednisone until after labs are drawn that morning).   She has history of MS diagnosed approximate 27 years ago, not currently on disease modifying treatment.  Also has chronic cough which is thought to be secondary to vocal cord paralysis, is being evaluated/managed by ENT and speech therapy.  She has history of hospitalization in July and August of 2022 for pneumonia.  She was eventually referred to immunology for concerns regarding recurrent infections, and is currently receiving weekly subcutaneous IVIG administrations.  Today, she reports that she continues to feel very fatigued.  She reports that she felt a bit better the end of last week but feels fatigued today.  Last IVIG infusion was yesterday.  She has noticed some bilateral lower extremity swelling but denies significant shortness of breath.  She does continue to have her cough.  She is participating in speech therapy and is attempting to control her cough with behavioral modifications.  As opposed to undergoing surgery with ENT.    ROS: see hpi    Objective:     BP 98/72   Pulse 79   Temp 98.4  F (36.9 C) (Oral)   Ht '5\' 3"'$  (1.6 m)   SpO2 94%   BMI 20.51 kg/m  BP Readings from Last 3 Encounters:  01/16/22 98/72  01/16/22 (!) 93/57  01/10/22 108/74   Wt Readings from Last 3 Encounters:  01/16/22 115 lb 12.8 oz (52.5 kg)  01/10/22 113 lb 2 oz (51.3 kg)  11/27/21 112 lb (50.8 kg)      Physical Exam Vitals reviewed.  Constitutional:      General: She is not in acute distress.    Appearance: Normal appearance.  HENT:     Head: Normocephalic and atraumatic.  Neck:     Vascular: No carotid bruit.  Cardiovascular:     Rate and Rhythm: Normal rate and regular rhythm.     Pulses: Normal pulses.     Heart sounds: Normal heart sounds.  Pulmonary:     Effort: Pulmonary effort is normal.     Breath sounds: Normal breath sounds.  Musculoskeletal:     Right lower leg: Edema present.     Left lower leg: Edema present.  Skin:    General: Skin is warm and dry.  Neurological:     General: No focal deficit present.     Mental Status: She is alert and oriented to person, place, and time.  Psychiatric:        Mood and Affect: Mood normal.        Behavior: Behavior normal.        Judgment: Judgment normal.      Results for orders placed or  performed in visit on 10/93/23  Basic metabolic panel  Result Value Ref Range   Sodium 129 (L) 135 - 145 mEq/L   Potassium 4.2 3.5 - 5.1 mEq/L   Chloride 95 (L) 96 - 112 mEq/L   CO2 24 19 - 32 mEq/L   Glucose, Bld 118 (H) 70 - 99 mg/dL   BUN 23 6 - 23 mg/dL   Creatinine, Ser 0.89 0.40 - 1.20 mg/dL   GFR 66.12 >60.00 mL/min   Calcium 9.9 8.4 - 10.5 mg/dL      The 10-year ASCVD risk score (Arnett DK, et al., 2019) is: 5.2%    Assessment & Plan:   Problem List Items Addressed This Visit       Other   Hyponatremia - Primary    Etiology unclear, consolidation noted on chest x-ray.  Patient has completed course of prednisone and doxycycline.  Could be secondary to IVIG versus SIADH versus heart failure.  Will await labs of  already been ordered including serum osmolality, urine osmolality, urine spot sodium, and urine culture.  I have reached out to immunologist to discuss possibility that IVIG may be contributing to hyponatremia.  Have also discussed patient presentation and current plan of treatment with supervising physician.  Awaiting to hear back from immunologist.  Supervising physician agreeable to current plan.  Due to patient's slightly elevated BNP and bilateral lower extremity edema will order echocardiogram for further evaluation of possible heart failure.  Recheck metabolic panel to look for stability as patient is starting to feel worse again today, further recommendations will also be made based upon labs results.      Relevant Orders   Basic metabolic panel (Completed)   Localized edema    Cardiac echocardiogram ordered for further evaluation.      Relevant Orders   ECHOCARDIOGRAM COMPLETE   Localized swelling, mass, or lump of lower extremity, bilateral    Cardiac echocardiogram ordered for further evaluation.      Relevant Orders   ECHOCARDIOGRAM COMPLETE   Fatigue    Awaiting lab results, cardiac echocardiogram also ordered for further evaluation.      Relevant Orders   ECHOCARDIOGRAM COMPLETE    Return in about 6 weeks (around 02/27/2022) for with Tashiba Timoney F/U.    Ailene Ards, NP

## 2022-01-16 NOTE — Patient Instructions (Signed)
Call immunologist to see if he thinks your IVIG could have caused your hyponatremia (low sodium). If so, do you need to stop if or change how often you take it?

## 2022-01-16 NOTE — Assessment & Plan Note (Signed)
Etiology unclear, consolidation noted on chest x-ray.  Patient has completed course of prednisone and doxycycline.  Could be secondary to IVIG versus SIADH versus heart failure.  Will await labs of already been ordered including serum osmolality, urine osmolality, urine spot sodium, and urine culture.  I have reached out to immunologist to discuss possibility that IVIG may be contributing to hyponatremia.  Have also discussed patient presentation and current plan of treatment with supervising physician.  Awaiting to hear back from immunologist.  Supervising physician agreeable to current plan.  Due to patient's slightly elevated BNP and bilateral lower extremity edema will order echocardiogram for further evaluation of possible heart failure.  Recheck metabolic panel to look for stability as patient is starting to feel worse again today, further recommendations will also be made based upon labs results.

## 2022-01-16 NOTE — Assessment & Plan Note (Signed)
Awaiting lab results, cardiac echocardiogram also ordered for further evaluation.

## 2022-01-16 NOTE — Progress Notes (Signed)
Patient Active Problem List   Diagnosis Date Noted   Bronchitis 01/10/2022   Chronic obstructive pulmonary disease ([place]) 09/24/2021   Pulmonary mycobacterial infection ([place]) 09/24/2021   Bilateral vocal cord paralysis 09/24/2021   Abnormal CT of the chest 07/18/2021   Persistent cough 06/25/2021   COPD (chronic obstructive pulmonary disease) (HCC)    Iron deficiency anemia secondary to inadequate dietary iron intake 12/20/2020   Purple toe syndrome of both feet ([place]) 07/26/2020   Cough variant asthma vs vcd  related to MS  11/10/2016   Positional sleep apnea 09/10/2016   Periodic limb movements of sleep 09/10/2016   OSA on CPAP 02/08/2016   Hyperlipidemia 11/04/2013   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/21/2013   Dislocation of hip prosthesis ([place]) 03/12/2013   Urinary retention with incomplete bladder emptying 03/09/2013   Dysphagia, pharyngoesophageal phase 03/09/2013   Constipation 02/26/2013   Post-menopausal osteoporosis 09/17/2007   Hypothyroidism 04/01/2007   Multiple sclerosis ([place]) 04/01/2007    Patient's Medications  New Prescriptions   No medications on file  Previous Medications   ALBUTEROL (VENTOLIN HFA) 108 (90 BASE) MCG/ACT INHALER    INHALE 2 PUFFS INTO LUNGS EVERY 6 HOURS AS NEEDED.   AMPHETAMINE-DEXTROAMPHETAMINE (ADDERALL) 20 MG TABLET    Take 1 tablet (20 mg total) by mouth daily.   BACLOFEN (LIORESAL) 20 MG TABLET    Take 20 mg by mouth 3 (three) times daily as needed for muscle spasms.   CALCIUM PO    Take 1 tablet by mouth daily.   DEXLANSOPRAZOLE (DEXILANT) 60 MG CAPSULE    TAKE 1 CAPSULE BY MOUTH  ONCE DAILY WITH BREAKFAST   DOXYCYCLINE (VIBRA-TABS) 100 MG TABLET    Take 1 tablet (100 mg total) by mouth 2 (two) times daily.   FAMOTIDINE (PEPCID) 20 MG TABLET    One after supper   FERRIC MALTOL (ACCRUFER) 30 MG CAPS    Take 1 capsule by mouth in the morning and at bedtime.   FLUOXETINE (PROZAC) 40 MG CAPSULE    Take 40 mg by mouth daily.   FUROSEMIDE (LASIX) 20  MG TABLET    TAKE 1 TABLET BY MOUTH  DAILY   IMMUNE GLOBULIN, HUMAN,-KLHW (XEMBIFY [place])    Inject into the skin.   LEVOTHYROXINE (SYNTHROID) 100 MCG TABLET    TAKE 1 TABLET BY MOUTH ONCE  DAILY BEFORE BREAKFAST   MULTIPLE VITAMIN (MULTIVITAMIN WITH MINERALS) TABS TABLET    Take 1 tablet by mouth daily.   PILOCARPINE (SALAGEN) 7.5 MG TABLET    Take 7.5 mg by mouth 2 (two) times daily.   PREDNISONE (DELTASONE) 20 MG TABLET    Take 2 tablets (40 mg total) by mouth daily with breakfast.   PROBIOTIC PRODUCT (PROBIOTIC PO)    Take 1 capsule by mouth daily. Harmony probiotic   PROMETHAZINE-DEXTROMETHORPHAN (PROMETHAZINE-DM) 6.25-15 MG/5ML SYRUP    Take 5 mLs by mouth 4 (four) times daily as needed for cough.   RESPIRATORY THERAPY SUPPLIES (FLUTTER) DEVI    Use as directed   RSV VACCINE RECOMB ADJUVANTED (AREXVY) 120 MCG/0.5ML INJECTION    Inject into the muscle.   TAMSULOSIN (FLOMAX) 0.4 MG CAPS CAPSULE    Take 0.4 mg by mouth daily.   TEMAZEPAM (RESTORIL) 15 MG CAPSULE    Take 1 capsule (15 mg total) by mouth at bedtime.   TRELEGY ELLIPTA 100-62.5-25 MCG/ACT AEPB    Inhale 1 puff into the lungs daily.   TRIMETHOPRIM (TRIMPEX) 100 MG TABLET  Take 100 mg by mouth daily.  Modified Medications   No medications on file  Discontinued Medications   No medications on file    Subjective: 71 YO female with PMH of OA/Osteoporosis, Cervical ca s/p hysterectomy, COPD, cough variant asthma, long standing h/o MS  approx 27 years, dysphagia for approx 15 years, Hip # in 2004 s/p  Rt hip arthroplasty 2014 followed by conversion to total hip arthroplasty 2017 then hip revision 2019, B/L Vocal cord paralysis  noticed by DR [name] in May 2022, hypothyroidism, GERD, who is referred for 4/28 sputum cx growing Mycobacterium mucogenicum/phocaicum. Accompanied by husband.   Started having cough since March 2022. Cough is productive mostly mucoid with yellowish/brown phlegm. She had PNA in July and August 2022.   Admitted  09/2020 with history of fever and chronic cough for 4 months.  She had failed outpatient antibiotics and steroids.  CT chest 7/7 with findings as below.  PCT negative. MRSA PCR, COVID and flu were negative.  Respiratory virus panel was also negative.  Blood cultures were no growth.  The urine Streptococcus and Legionella was negative. there was concerns for aspiration pneumonitis per SLP. Swallow eval 7/25 with no overt aspiration but pharyngeal dysphagia. She was discharged on 09/29/2020 with outpatient follow-up with GI and pulmonary.  Received IV abtx in the hospital but  no antibiotics or steroids were prescribed.  Re-admitted 10/2020 with ongoing intractable cough and SOB. CT  chest 8/4 with findings as below.  S/p bronchoscopy and biopsy 8/5 with negative cx except candida albicans and streptococcus constellatus. Patient initially on abtx and steroids that was discontinued. High concerns for BOOP. Cytology showed reactive bronchial cells and benign lung tissue. Discharged home on symptomatic treatment and fu with Pulmonary. She was referred to immunology.  Her Ocrevus therapy for MS was stopped due to recurrent infections.  She received  8 weeks of Rocephin.  And she is now on weekly IVIG per immunology.  Previous MS doctor passed and will be seeing a new doctor in July 2023. Per husband, her IG was so low it was not listed in the chart and now it has improved.   Husband says she coughs throughout the day and night to the extent that she cannot get sleep and she is fatigued. She has a stimulator at the rt leg, able to walk with the help of walker. She gets help from husband for household work like cooking, Education administrator, Medical sales representative. Husband tells she is very slow at what she does.  She has difficulty swallowing due to MS.    Denies fevers, chills and sweats Denies nausea, vomiting and diarrhea.  She self caths and tell she has had recurrent UTIs lately Appetite is not good and but husband make sure she eats 3  meals a day. Lost approx 10 lbs in 1 year or so.   Denies ever smoking, alcohol and IVDU. No pets at home. 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 Symptoms exactly the same as seen during last visit. Seen by DR Purnell Shoemaker 8/14 with concerns for VC paralysis and was referred to Dr [name] at ENT with [facility] . Seen by ENT 10/23 with possible cause being Bilateral vocal cord paralysis per patient's husband report  and plans for speech therapy. Had viral URTI like symptoms last week which is getting better. Was tested negative for Flu and COVID. Seen by PCP for sick visit for fatigue when she was given a course of doxycycline, prednisone. Chest xray  10/23 Small left basilar pulmonary opacity/small left pleural effusion. CBC and CMP with no significant abnormality. She is feeling better today. Appetite is still not so good. Discussed to fu with ENT and pulm and fu with [place] as needed   Review of Systems: all systems reviewed with pertinent positives and negatives as listed above  Past Medical History:  Diagnosis Date   Arthritis    oa   Cervical cancer ([place]) 2005   COPD (chronic obstructive pulmonary disease) ([place])    Multiple sclerosis ([place])    Multiple sclerosis ([place]) 1996   Neuromuscular disorder ([place])    Osteoporosis    Other diseases of vocal cords    vocal cords are not even   Other voice and resonance disorders    Pneumonia 2014, 1992   Sleep apnea    no cpap used   Unspecified hypothyroidism    Past Surgical History:  Procedure Laterality Date   ABDOMINAL HYSTERECTOMY  03/25/2003   for cervical cancer, complete   BRONCHIAL BIOPSY  10/26/2020   Procedure: BRONCHIAL BIOPSIES;  Surgeon: Garner Nash, DO;  Location: [facility];  Service: Pulmonary;;   BRONCHIAL BRUSHINGS  10/26/2020   Procedure: BRONCHIAL BRUSHINGS;  Surgeon: Garner Nash, DO;  Location: [facility];  Service: Pulmonary;;   BRONCHIAL WASHINGS  10/26/2020   Procedure: BRONCHIAL  WASHINGS;  Surgeon: Garner Nash, DO;  Location: [facility];  Service: Pulmonary;;   CATARACT EXTRACTION, BILATERAL Bilateral    lens for cataracts   CONVERSION TO TOTAL HIP Right 09/18/2015   Procedure: CONVERSION OF RIGHT HEMI ARTHROPLASTY TO TOTAL HIP ARTHROPLASTY ACETABULAR REVISON ;  Surgeon: Paralee Cancel, MD;  Location: [facility];  Service: Orthopedics;  Laterality: Right;   HIP ARTHROPLASTY Right 03/01/2013   Procedure: ARTHROPLASTY BIPOLAR HIP;  Surgeon: Mauri Pole, MD;  Location: [facility];  Service: Orthopedics;  Laterality: Right;   HIP CLOSED REDUCTION Right 03/12/2013   Procedure: CLOSED REDUCTION HIP;  Surgeon: Gearlean Alf, MD;  Location: [facility];  Service: Orthopedics;  Laterality: Right;   HIP CLOSED REDUCTION Right 03/19/2013   Procedure: CLOSED REDUCTION HIP;  Surgeon: Johnn Hai, MD;  Location: [facility];  Service: Orthopedics;  Laterality: Right;   HIP CLOSED REDUCTION Right 11/10/2015   Procedure: CLOSED MANIPULATION HIP;  Surgeon: Rod Can, MD;  Location: [facility];  Service: Orthopedics;  Laterality: Right;   HIP CLOSED REDUCTION Right 06/05/2017   Procedure: CLOSED REDUCTION HIP;  Surgeon: [name], MD;  Location: [facility];  Service: Orthopedics;  Laterality: Right;   SMALL INTESTINE SURGERY     TOTAL HIP REVISION Right 06/06/2017   Procedure: TOTAL HIP REVISION;  Surgeon: [name], MD;  Location: [facility];  Service: Orthopedics;  Laterality: Right;   VESICOVAGINAL FISTULA CLOSURE W/ TAH  03/25/2003   VIDEO BRONCHOSCOPY N/A 10/26/2020   Procedure: VIDEO BRONCHOSCOPY WITH FLUORO;  Surgeon: Garner Nash, DO;  Location: [facility];  Service: Pulmonary;  Laterality: N/A;    Social History   Tobacco Use   Smoking status: Never   Smokeless tobacco: Never  Vaping Use   Vaping Use: Never used  Substance Use Topics   Alcohol use: Yes    Alcohol/week: 2.0 standard drinks of alcohol    Types: 1 Glasses of wine, 1 Cans of beer per week     Comment: 2x/week    Drug use: No    Family History  Problem Relation Age of Onset   Other Mother  COVID   Colon cancer Father    Atrial fibrillation Brother    Heart attack Maternal Grandfather    Parkinson's disease Paternal Grandfather    Esophageal cancer Neg Hx    Stomach cancer Neg Hx    Rectal cancer Neg Hx     Allergies  Allergen Reactions   Tramadol     hallucinations    Contrast Media [Iodinated Contrast Media] Rash   Iohexol Hives   Morphine And Related Other (See Comments)    Reaction:  Hallucinations    Methylprednisolone Other (See Comments)    Reaction:  Decreases pts heart rate    Hydrocodone Itching   Oxycodone-Acetaminophen Rash and Other (See Comments)    Reaction:  Hallucinations    Phenytoin Sodium Extended Rash   Zanaflex [Tizanidine Hydrochloride] Rash    Health Maintenance  Topic Date Due   Hepatitis C Screening  Never done   COVID-19 Vaccine (6 - Moderna risk series) 04/02/2021   INFLUENZA VACCINE  06/22/2022 (Originally 10/22/2021)   MAMMOGRAM  03/24/2022   Medicare Annual Wellness (AWV)  11/04/2022   COLONOSCOPY (Pts 45-35yr Insurance coverage will need to be confirmed)  03/05/2026   TETANUS/TDAP  04/25/2029   Pneumonia Vaccine 71 Years old  Completed   DEXA SCAN  Completed   Zoster Vaccines- Shingrix  Completed   HPV VACCINES  Aged Out    Objective: BP (!) 93/57   Pulse 94   Temp 98.8 F (37.1 C) (Oral)   Wt 115 lb 12.8 oz (52.5 kg)   SpO2 92%   BMI 20.51 kg/m      Physical Exam Constitutional:      Appearance: Normal appearance. Elderly caucasian female HENT:     Head: Normocephalic and atraumatic.      Mouth: Mucous membranes are moist.  Eyes:    Conjunctiva/sclera: Conjunctivae normal.     Pupils:   Cardiovascular:     Rate and Rhythm: Normal rate and regular rhythm.     Heart sounds:   Pulmonary:     Effort: Pulmonary effort is normal.     Breath sounds: decreased b/l air entry   Abdominal:      General: Non distended     Palpations: soft.   Musculoskeletal:        General: Normal range of motion. RT leg has a stimulator. B/l Purple toes   Skin:    General: Skin is warm and dry.     Comments:  Neurological:     General: grossly non focal , ambulatory with the help of walke    Mental Status: awake, alert and oriented to person, place, and time.   Psychiatric:        Mood and Affect: Mood normal.   Lab Results Lab Results  Component Value Date   WBC 7.4 01/10/2022   HGB 12.4 01/10/2022   HCT 37.3 01/10/2022   MCV 91.7 01/10/2022   PLT 218.0 01/10/2022    Lab Results  Component Value Date   CREATININE 0.85 01/14/2022   BUN 22 01/14/2022   NA 137 01/14/2022   K 3.9 01/14/2022   CL 100 01/14/2022   CO2 27 01/14/2022    Lab Results  Component Value Date   ALT 22 01/14/2022   AST 30 01/14/2022   ALKPHOS 78 01/14/2022   BILITOT 0.3 01/14/2022    Lab Results  Component Value Date   CHOL 169 01/14/2022   HDL 41.50 01/14/2022   LDLCALC 96 01/14/2022   LDLDIRECT 116.1 06/25/2010  TRIG 157.0 (H) 01/14/2022   CHOLHDL 4 01/14/2022   No results found for: "LABRPR", "RPRTITER" No results found for: "HIV1RNAQUANT", "HIV1RNAVL", "CD4TABS"   Microbiology DG Chest 2 View  Result Date: 01/13/2022 CLINICAL DATA:  Cough and fatigue EXAM: CHEST - 2 VIEW COMPARISON:  Chest x-ray November 27, 2021 FINDINGS: The cardiomediastinal silhouette is unchanged in contour. Left basilar pulmonary opacity and small left pleural effusion. The right lung is clear. No large pneumothorax. Visualized upper abdomen is unremarkable. No acute osseous abnormality. IMPRESSION: 1. Small left basilar pulmonary opacity. Differential considerations include atelectasis, aspiration, or infection. 2. Small left pleural effusion. Electronically Signed   By: Beryle Flock M.D.   On: 01/13/2022 08:00    Imaging DG Chest 2 View  Result Date: 01/13/2022 CLINICAL DATA:  Cough and fatigue EXAM: CHEST  - 2 VIEW COMPARISON:  Chest x-ray November 27, 2021 FINDINGS: The cardiomediastinal silhouette is unchanged in contour. Left basilar pulmonary opacity and small left pleural effusion. The right lung is clear. No large pneumothorax. Visualized upper abdomen is unremarkable. No acute osseous abnormality. IMPRESSION: 1. Small left basilar pulmonary opacity. Differential considerations include atelectasis, aspiration, or infection. 2. Small left pleural effusion. Electronically Signed   By: Beryle Flock M.D.   On: 01/13/2022 08:00    Assessment/Plan # Sputum cx positive for M mucogenicum/phocaicum # COPD, Cough Variant Asthma # Multiple sclerosis with associated B/L Vocal Cord Paralysis  09/17/21 AFB smear negative and cx NGTD 09/19/21 AFB smear negative and cx NGTD 09/23/21 AFB smear negative and cx NGTD 09/28/20 respiratory cx normal respiratory flora 10/26/20 AFB smear and cx negative, strep constellatus , candida albicans ( tx with 8 weeks of IV ceftriaxone) 07/19/21 AFB smear and cx M mucogenicum/phocaicum, candida albicans ( tx 7 days of Fluconazole)  Does not meet criteria for Pulmonary NTM infection and hence, no indication to treat Fu with Pulmonary after CT Chest,  Allergy as instructed  Fu with ENT and speech for Vocal cord paralysis as instructed Fu with [place] as needed.   I have personally spent 42 minutes involved in face-to-face and non-face-to-face activities for this patient on the day of the visit including   Wilber Oliphant, [place] for [facility] 01/16/2022, 9:35 AM

## 2022-01-17 ENCOUNTER — Telehealth: Payer: Self-pay | Admitting: Nurse Practitioner

## 2022-01-17 ENCOUNTER — Other Ambulatory Visit: Payer: Self-pay | Admitting: Nurse Practitioner

## 2022-01-17 ENCOUNTER — Other Ambulatory Visit: Payer: Self-pay

## 2022-01-17 DIAGNOSIS — E871 Hypo-osmolality and hyponatremia: Secondary | ICD-10-CM

## 2022-01-17 DIAGNOSIS — N3000 Acute cystitis without hematuria: Secondary | ICD-10-CM

## 2022-01-17 LAB — OSMOLALITY, URINE

## 2022-01-17 LAB — OSMOLALITY: Osmolality: 287 mOsm/kg (ref 278–305)

## 2022-01-17 LAB — URINE CULTURE

## 2022-01-17 LAB — SODIUM, URINE, RANDOM: Sodium, Ur: 26 mmol/L — ABNORMAL LOW (ref 28–272)

## 2022-01-17 MED ORDER — CIPROFLOXACIN HCL 250 MG PO TABS: 250.0000 mg | ORAL_TABLET | Freq: Two times a day (BID) | ORAL | 0 refills | Status: AC

## 2022-01-17 NOTE — Progress Notes (Signed)
Called pt and let her know prescription send in for her infection and also let her know to  come in 01/30/22 for labs drawn   FYI will put in the order Monday

## 2022-01-17 NOTE — Telephone Encounter (Signed)
Please call this patient and schedule her for a lab draw in 2 weeks and a lab draw 3 weeks (on or around 01/30/22 and on or around 02/06/22).   I am still waiting on some lab results but I did speak to her immunologist Dr. Kristen Cardinal. He told me that he has not seen IVIG cause hyponatremia directly before, but it can sometimes cause excessive thirst. He recommends that she stop the IVIG for 2 treatments (he did review her immunity labs and feels her levels are strong enough for holding two treatments), have [place] recheck her labs at that time. If sodium normalizes she should restart the IVIG treatment and shortly after recheck the labs again.   If her solidum drops again after restarting the IVIG then it may be related to her IVIG. Please also educate her to not drink an excessive amount of water as this could be contributing to her low sodium. I generally would recommend around 60 ounces a day based on her weight. If she is drinking over this amount she should cut back to 60 ounces of water daily.   As her other labs result I will let her know if there are any additional recommendations.

## 2022-01-17 NOTE — Progress Notes (Signed)
Estimated Creatinine Clearance: 49.3 mL/min (by C-G formula based on SCr of 0.89 mg/dL).   Please call patient and let her know that he urine results do show evidence of UTI. Based on urine culture will prescribe ciprofloxacin '250mg'$  tablet. She should take 1 tablet by mouth twice a day for 3 days. Please make sure she is aware of the other telephone note from earlier today. I have copied and pasted this below:  "Please call this patient and schedule her for a lab draw in 2 weeks and a lab draw 3 weeks (on or around 01/30/22 and on or around 02/06/22).    I am still waiting on some lab results but I did speak to her immunologist Dr. Kristen Cardinal. He told me that he has not seen IVIG cause hyponatremia directly before, but it can sometimes cause excessive thirst. He recommends that she stop the IVIG for 2 treatments (he did review her immunity labs and feels her levels are strong enough for holding two treatments), have [place] recheck her labs at that time. If sodium normalizes she should restart the IVIG treatment and shortly after recheck the labs again.    If her solidum drops again after restarting the IVIG then it may be related to her IVIG. Please also educate her to not drink an excessive amount of water as this could be contributing to her low sodium. I generally would recommend around 60 ounces a day based on her weight. If she is drinking over this amount she should cut back to 60 ounces of water daily."

## 2022-01-20 ENCOUNTER — Ambulatory Visit
Admission: RE | Admit: 2022-01-20 | Discharge: 2022-01-20 | Disposition: A | Discharge status: Home / Self Care | Payer: Medicare Other | Source: Ambulatory Visit | Attending: Internal Medicine | Admitting: Internal Medicine

## 2022-01-20 DIAGNOSIS — R053 Chronic cough: Secondary | ICD-10-CM

## 2022-01-20 DIAGNOSIS — Z23 Encounter for immunization: Secondary | ICD-10-CM | POA: Diagnosis not present

## 2022-01-20 DIAGNOSIS — R911 Solitary pulmonary nodule: Secondary | ICD-10-CM | POA: Diagnosis not present

## 2022-01-20 DIAGNOSIS — I700 Atherosclerosis of aorta: Secondary | ICD-10-CM | POA: Diagnosis not present

## 2022-01-20 DIAGNOSIS — J479 Bronchiectasis, uncomplicated: Secondary | ICD-10-CM | POA: Diagnosis not present

## 2022-01-20 DIAGNOSIS — J449 Chronic obstructive pulmonary disease, unspecified: Secondary | ICD-10-CM | POA: Diagnosis not present

## 2022-01-23 ENCOUNTER — Other Ambulatory Visit: Payer: Self-pay | Admitting: Internal Medicine

## 2022-01-23 DIAGNOSIS — D472 Monoclonal gammopathy: Secondary | ICD-10-CM | POA: Diagnosis not present

## 2022-01-23 DIAGNOSIS — D8489 Other immunodeficiencies: Secondary | ICD-10-CM | POA: Diagnosis not present

## 2022-01-23 DIAGNOSIS — J449 Chronic obstructive pulmonary disease, unspecified: Secondary | ICD-10-CM | POA: Diagnosis not present

## 2022-01-23 DIAGNOSIS — J418 Mixed simple and mucopurulent chronic bronchitis: Secondary | ICD-10-CM

## 2022-01-23 DIAGNOSIS — D801 Nonfamilial hypogammaglobulinemia: Secondary | ICD-10-CM | POA: Diagnosis not present

## 2022-01-23 DIAGNOSIS — J45991 Cough variant asthma: Secondary | ICD-10-CM

## 2022-01-23 DIAGNOSIS — D849 Immunodeficiency, unspecified: Secondary | ICD-10-CM | POA: Diagnosis not present

## 2022-01-23 DIAGNOSIS — G35 Multiple sclerosis: Secondary | ICD-10-CM | POA: Diagnosis not present

## 2022-01-28 ENCOUNTER — Ambulatory Visit (HOSPITAL_COMMUNITY): Payer: Medicare Other | Attending: Nurse Practitioner

## 2022-01-28 DIAGNOSIS — R5383 Other fatigue: Secondary | ICD-10-CM | POA: Insufficient documentation

## 2022-01-28 DIAGNOSIS — R600 Localized edema: Secondary | ICD-10-CM | POA: Diagnosis not present

## 2022-01-28 DIAGNOSIS — R2243 Localized swelling, mass and lump, lower limb, bilateral: Secondary | ICD-10-CM | POA: Diagnosis not present

## 2022-01-28 LAB — ECHOCARDIOGRAM COMPLETE
Area-P 1/2: 2.69 cm2
S' Lateral: 2.8 cm

## 2022-01-29 ENCOUNTER — Encounter: Payer: Self-pay | Admitting: Primary Care

## 2022-01-29 ENCOUNTER — Ambulatory Visit (INDEPENDENT_AMBULATORY_CARE_PROVIDER_SITE_OTHER): Payer: Medicare Other | Admitting: Primary Care

## 2022-01-29 VITALS — BP 110/68 | HR 94 | Temp 97.9°F | Ht 63.0 in | Wt 110.4 lb

## 2022-01-29 DIAGNOSIS — J45991 Cough variant asthma: Secondary | ICD-10-CM | POA: Diagnosis not present

## 2022-01-29 DIAGNOSIS — A310 Pulmonary mycobacterial infection: Secondary | ICD-10-CM | POA: Diagnosis not present

## 2022-01-29 DIAGNOSIS — J3802 Paralysis of vocal cords and larynx, bilateral: Secondary | ICD-10-CM | POA: Diagnosis not present

## 2022-01-29 DIAGNOSIS — Z23 Encounter for immunization: Secondary | ICD-10-CM | POA: Diagnosis not present

## 2022-01-29 DIAGNOSIS — J449 Chronic obstructive pulmonary disease, unspecified: Secondary | ICD-10-CM

## 2022-01-29 NOTE — Progress Notes (Addendum)
$'@Patient'y$  ID: Kayla [name], female    DOB: 11/01/1950, 71 y.o.   MRN: [id number]  Chief Complaint  Patient presents with   Follow-up    Coughing and wheezing is still present. Not any worse, but not better    Referring provider: Janith Lima, MD  HPI: 71 year old female, never smoked. PMH significant for chronic cough, COPD, OSA on CPAP, vocal cord paralysis, pulmonary mycobacterial infection, hypothyroidism, multiple sclerosis,   01/29/2022 Patient presents today for follow-up. Hx MS and vocal cord paralysis. She saw Dr. [name] with ENT, no surgical options. Following with speech therapy. She was told that she is aspirating her secretion, she is practicing exercises daily. She is using saline inhaler daily. She had CT chest on 01/22/22 that showed mild-moderate bronchiectasis, bronchial debris and mucoid impaction left lower lobe with peribronchovascular nodularity and mild architectural disortion. She has a chronic cough but no purulent mucus production. Breathing and wheeze are baseline. Symptoms are management per patient.    Chest imaging: CT chest wo contrast 10/30.23>> Left lower lobe bronchial debris, mucoid impaction, peribronchovascular nodularity and mild architectural distortion, findings which may represent acute on chronic aspiration. An infectious bronchiolitis superimposed on postinfectious/postinflammatory scarring is not excluded. 2. Mild to moderate cylindrical bronchiectasis.   Allergies  Allergen Reactions   Tramadol     hallucinations    Contrast Media [Iodinated Contrast Media] Rash   Iohexol Hives   Morphine And Related Other (See Comments)    Reaction:  Hallucinations    Methylprednisolone Other (See Comments)    Reaction:  Decreases pts heart rate    Hydrocodone Itching   Oxycodone-Acetaminophen Rash and Other (See Comments)    Reaction:  Hallucinations    Phenytoin Sodium Extended Rash   Zanaflex [Tizanidine Hydrochloride] Rash     Immunization History  Administered Date(s) Administered   DTP 04/10/2009   Fluad Quad(high Dose 65+) 12/29/2018, 01/29/2022   Influenza Split 12/10/2010, 02/11/2012, 12/22/2012, 01/02/2014, 12/22/2014   Influenza Whole 02/19/2005, 03/28/2009, 01/23/2016   Influenza, High Dose Seasonal PF 02/23/2018   Influenza,inj,Quad PF,6+ Mos 02/10/2017   Influenza-Unspecified 12/02/2018, 01/08/2019, 12/22/2019, 01/15/2021, 02/05/2021   Moderna Covid-19 Vaccine Bivalent Booster 47yr & up 02/05/2021, 01/20/2022   Moderna Sars-Covid-2 Vaccination 04/29/2019, 06/01/2019, 01/19/2020, 08/14/2020   Pneumococcal Conjugate-13 11/06/2015   Pneumococcal Polysaccharide-23 03/28/2009, 04/26/2019   Respiratory Syncytial Virus Vaccine,Recomb Aduvanted(Arexvy) 12/26/2021   Tdap 04/26/2019   Zoster Recombinat (Shingrix) 09/07/2019, 02/17/2020    Past Medical History:  Diagnosis Date   Arthritis    oa   [place]ervical cancer ([place] 2005   COPD [place] obstructive [place] disease) [place]    Multiple sclerosis [place]    [place]   Neuromuscular disorder ([place]    Osteoporosis    Other diseases of vocal cords    vocal cords are not even   Other voice and resonance disorders    Pneumonia 2014, 1992   Sleep apnea    no cpap used   Unspecified hypothyroidism     Tobacco History: Social History   Tobacco Use  Smoking Status Never  Smokeless Tobacco Never   Counseling given: Not Answered   Outpatient Medications Prior to Visit  Medication Sig Dispense Refill   albuterol (VENTOLIN HFA) 108 (90 Base) MCG/ACT inhaler INHALE 2 PUFFS INTO LUNGS EVERY 6 HOURS AS NEEDED. 18 g 0   amphetamine-dextroamphetamine (ADDERALL) 20 MG tablet Take 1 tablet (20 mg total) by mouth daily. 30 tablet 0   baclofen (LIORESAL) 20 MG tablet Take  20 mg by mouth 3 (three) times daily as needed for muscle spasms.     CALCIUM PO Take 1 tablet by mouth daily.     dexlansoprazole (DEXILANT) 60 MG capsule TAKE 1  CAPSULE BY MOUTH  ONCE DAILY WITH BREAKFAST 90 capsule 3   famotidine (PEPCID) 20 MG tablet One after supper 30 tablet 11   Ferric Maltol (ACCRUFER) 30 MG CAPS Take 1 capsule by mouth in the morning and at bedtime. 180 capsule 0   FLUoxetine (PROZAC) 40 MG capsule Take 40 mg by mouth daily.     furosemide (LASIX) 20 MG tablet TAKE 1 TABLET BY MOUTH  DAILY 90 tablet 3   Immune Globulin, Human,-klhw (XEMBIFY [place]) Inject into the skin.     levothyroxine (SYNTHROID) 100 MCG tablet TAKE 1 TABLET BY MOUTH ONCE  DAILY BEFORE BREAKFAST 90 tablet 3   Multiple Vitamin (MULTIVITAMIN WITH MINERALS) TABS tablet Take 1 tablet by mouth daily.     pilocarpine (SALAGEN) 7.5 MG tablet Take 7.5 mg by mouth 2 (two) times daily.     Probiotic Product (PROBIOTIC PO) Take 1 capsule by mouth daily. Harmony probiotic     Respiratory Therapy Supplies (FLUTTER) DEVI Use as directed 1 each 0   RSV vaccine recomb adjuvanted (AREXVY) 120 MCG/0.5ML injection Inject into the muscle. 0.5 mL 0   tamsulosin (FLOMAX) 0.4 MG CAPS capsule Take 0.4 mg by mouth daily.     TRELEGY ELLIPTA 100-62.5-25 MCG/ACT AEPB Inhale 1 puff into the lungs daily. 120 each 1   trimethoprim (TRIMPEX) 100 MG tablet Take 100 mg by mouth daily.     temazepam (RESTORIL) 15 MG capsule Take 1 capsule (15 mg total) by mouth at bedtime. 30 capsule 2   predniSONE (DELTASONE) 20 MG tablet Take 2 tablets (40 mg total) by mouth daily with breakfast. 10 tablet 0   promethazine-dextromethorphan (PROMETHAZINE-DM) 6.25-15 MG/5ML syrup Take 5 mLs by mouth 4 (four) times daily as needed for cough. 240 mL 0   No facility-administered medications prior to visit.   Review of Systems  Review of Systems  Constitutional: Negative.   Respiratory:  Positive for cough.        Upper airway cough/throat clearing   Physical Exam  BP 110/68 (BP Location: Left Arm, Patient Position: Sitting, Cuff Size: Normal)   Pulse 94   Temp 97.9 F (36.6 C) (Oral)   Ht '5\' 3"'$  (1.6 m)    Wt 110 lb 6.4 oz (50.1 kg)   SpO2 96%   BMI 19.56 kg/m  Physical Exam Constitutional:      Appearance: Normal appearance.  HENT:     Head: Normocephalic and atraumatic.     Mouth/Throat:     Mouth: Mucous membranes are moist.     Pharynx: Oropharynx is clear.  Cardiovascular:     Rate and Rhythm: Normal rate and regular rhythm.  Pulmonary:     Effort: Pulmonary effort is normal.     Breath sounds: Stridor present. Rales present.     Comments: Frequent throat clearing Neurological:     General: No focal deficit present.     Mental Status: She is alert. Mental status is at baseline.  Psychiatric:        Mood and Affect: Mood normal.        Behavior: Behavior normal.        Thought Content: Thought content normal.        Judgment: Judgment normal.      Lab Results:  CBC  Component Value Date/Time   WBC 7.4 01/10/2022 1411   RBC 4.07 01/10/2022 1411   HGB 12.4 01/10/2022 1411   HCT 37.3 01/10/2022 1411   PLT 218.0 01/10/2022 1411   MCV 91.7 01/10/2022 1411   MCH 27.3 10/24/2020 0504   MCHC 33.1 01/10/2022 1411   RDW 14.2 01/10/2022 1411   LYMPHSABS 1.3 11/27/2021 1101   MONOABS 0.6 11/27/2021 1101   EOSABS 0.1 11/27/2021 1101   BASOSABS 0.1 11/27/2021 1101    BMET    Component Value Date/Time   NA 135 01/30/2022 0845   K 4.0 01/30/2022 0845   CL 97 01/30/2022 0845   CO2 30 01/30/2022 0845   GLUCOSE 109 (H) 01/30/2022 0845   BUN 15 01/30/2022 0845   CREATININE 0.99 01/30/2022 0845   CALCIUM 9.5 01/30/2022 0845   GFRNONAA >60 10/23/2020 1328   GFRAA >60 06/08/2017 0704    BNP    Component Value Date/Time   BNP 129.3 (H) 10/23/2020 1231    ProBNP    Component Value Date/Time   PROBNP 112.0 (H) 01/14/2022 0824    Imaging: ECHOCARDIOGRAM COMPLETE  Result Date: 01/28/2022    ECHOCARDIOGRAM REPORT   Patient Name:   ESTEPHANIA LICCIARDI Date of Exam: 01/28/2022 Medical Rec #:  [id number]       Height:       63.0 in Accession #:    [id number]       Weight:       115.8 lb Date of Birth:  Jan 21, 1951        BSA:          1.533 m Patient Age:    7 years        BP:           98/72 mmHg Patient Gender: F               HR:           94 bpm. Exam Location:  [place] Procedure: 2D Echo, 3D Echo, Cardiac Doppler, Color Doppler and Strain Analysis Indications:    R53.83 Fatigue  History:        Patient has prior history of Echocardiogram examinations, most                 recent 02/26/2013. Multiple sclerosis.  Sonographer:    Basilia Jumbo BS, RDCS Referring Phys: [id number] [place]  1. Left ventricular ejection fraction by 3D volume is 53 %. The left ventricle has low normal function. The left ventricle has no regional wall motion abnormalities. Left ventricular diastolic parameters are indeterminate.  2. Right ventricular systolic function is normal. The right ventricular size is normal. Tricuspid regurgitation signal is inadequate for assessing PA pressure.  3. The mitral valve is normal in structure. Mild mitral valve regurgitation. No evidence of mitral stenosis.  4. The aortic valve is normal in structure. Aortic valve regurgitation is trivial. No aortic stenosis is present.  5. The inferior vena cava is normal in size with greater than 50% respiratory variability, suggesting right atrial pressure of 3 mmHg. Comparison(s): 02/26/13 EF 60-65%. PA pressure 73mHg. FINDINGS  Left Ventricle: Left ventricular ejection fraction by 3D volume is 53 %. The left ventricle has low normal function. The left ventricle has no regional wall motion abnormalities. Global longitudinal strain performed but not reported based on interpreter  judgement due to suboptimal tracking. The left ventricular internal cavity size was normal in size. There is no left ventricular hypertrophy. Left ventricular  diastolic parameters are indeterminate. Right Ventricle: The right ventricular size is normal. No increase in right ventricular wall thickness. Right ventricular systolic  function is normal. Tricuspid regurgitation signal is inadequate for assessing PA pressure. Left Atrium: Left atrial size was normal in size. Right Atrium: Right atrial size was normal in size. Pericardium: There is no evidence of pericardial effusion. Mitral Valve: The mitral valve is normal in structure. Mild mitral valve regurgitation. No evidence of mitral valve stenosis. Tricuspid Valve: The tricuspid valve is normal in structure. Tricuspid valve regurgitation is trivial. No evidence of tricuspid stenosis. Aortic Valve: The aortic valve is normal in structure. Aortic valve regurgitation is trivial. No aortic stenosis is present. Pulmonic Valve: The pulmonic valve was not well visualized. Pulmonic valve regurgitation is not visualized. No evidence of pulmonic stenosis. Aorta: The aortic root is normal in size and structure. Venous: The inferior vena cava is normal in size with greater than 50% respiratory variability, suggesting right atrial pressure of 3 mmHg. IAS/Shunts: No atrial level shunt detected by color flow Doppler.  LEFT VENTRICLE PLAX 2D LVIDd:         4.30 cm         Diastology LVIDs:         2.80 cm         LV e' medial:    6.73 cm/s LV PW:         0.70 cm         LV E/e' medial:  6.4 LV IVS:        0.90 cm         LV e' lateral:   5.92 cm/s LVOT diam:     2.00 cm         LV E/e' lateral: 7.3 LV SV:         52 LV SV Index:   34              2D LVOT Area:     3.14 cm        Longitudinal                                Strain                                2D Strain GLS  -18.5 %                                (A2C):                                2D Strain GLS  -20.7 %                                (A3C):                                2D Strain GLS  -17.0 %                                (A4C):  2D Strain GLS  -18.7 %                                Avg:                                 3D Volume EF                                LV 3D EF:    Left                                              ventricul                                             ar                                             ejection                                             fraction                                             by 3D                                             volume is                                             53 %.                                 3D Volume EF:                                3D EF:        53 %                                LV EDV:       78 ml                                LV ESV:       37 ml  LV SV:        41 ml RIGHT VENTRICLE             IVC RV Basal diam:  2.30 cm     IVC diam: 1.20 cm RV S prime:     10.30 cm/s TAPSE (M-mode): 2.0 cm LEFT ATRIUM             Index        RIGHT ATRIUM           Index LA diam:        3.10 cm 2.02 cm/m   RA Pressure: 3.00 mmHg LA Vol (A2C):   19.0 ml 12.39 ml/m  RA Area:     7.20 cm LA Vol (A4C):   27.1 ml 17.68 ml/m  RA Volume:   11.00 ml  7.18 ml/m LA Biplane Vol: 23.5 ml 15.33 ml/m  AORTIC VALVE LVOT Vmax:   90.10 cm/s LVOT Vmean:  61.000 cm/s LVOT VTI:    0.165 m  AORTA Ao Root diam: 3.00 cm Ao Asc diam:  3.10 cm MITRAL VALVE               TRICUSPID VALVE                            Estimated RAP:  3.00 mmHg MV Decel Time: 282 msec MV E velocity: 43.40 cm/s  SHUNTS MV A velocity: 72.40 cm/s  Systemic VTI:  0.16 m MV E/A ratio:  0.60        Systemic Diam: 2.00 cm Kardie Tobb DO Electronically signed by Berniece Salines DO Signature Date/Time: 01/28/2022/4:53:40 PM    Final    CT Chest Wo Contrast  Result Date: 01/22/2022 CLINICAL DATA:  Persistent cough. COPD. History of cervical cancer. * Tracking Code: BO * EXAM: CT CHEST WITHOUT CONTRAST TECHNIQUE: Multidetector CT imaging of the chest was performed following the standard protocol without IV contrast. RADIATION DOSE REDUCTION: This exam was performed according to the departmental dose-optimization program which includes automated exposure control,  adjustment of the mA and/or kV according to patient size and/or use of iterative reconstruction technique. COMPARISON:  07/29/2021. FINDINGS: Cardiovascular: Atherosclerotic calcification of the aorta. Heart size normal. Small amount of pericardial fluid may be physiologic. Mediastinum/Nodes: 1.8 cm low-attenuation lesion in the inferior left thyroid. No pathologically enlarged mediastinal or axillary lymph nodes. Calcified bihilar lymph nodes. Hilar regions are otherwise difficult to definitively evaluate without IV contrast. Esophagus is mildly dilated and air-filled throughout its course, which can be seen with dysmotility. Lungs/Pleura: Biapical pleuroparenchymal scarring. Mild to moderate cylindrical bronchiectasis. Scattered pulmonary parenchymal scarring. Bronchial debris and mucoid impaction in the left lower lobe with peribronchovascular nodularity and mild architectural distortion. No pleural fluid. Airway is unremarkable. Upper Abdomen: Low-attenuation lesions in the liver measure up to 12 mm and are likely cysts. Visualized portions of the liver, adrenal glands, kidneys, spleen, pancreas, stomach and bowel are otherwise unremarkable. Musculoskeletal: Degenerative changes in the spine. No worrisome lytic or sclerotic lesions. IMPRESSION: 1. Left lower lobe bronchial debris, mucoid impaction, peribronchovascular nodularity and mild architectural distortion, findings which may represent acute on chronic aspiration. An infectious bronchiolitis superimposed on postinfectious/postinflammatory scarring is not excluded. 2. Mild to moderate cylindrical bronchiectasis. 3. 1.8 cm low-attenuation left thyroid nodule. Previous thyroid ultrasound 07/21/2017. No follow-up recommended unless clinically warranted. (Ref: J Am Coll Radiol. 2015 Feb;12(2): 143-50). 4.  Aortic atherosclerosis (ICD10-I70.0). Electronically Signed   By: Lorin Picket M.D.  On: 01/22/2022 09:00   DG Chest 2 View  Result Date:  01/13/2022 CLINICAL DATA:  Cough and fatigue EXAM: CHEST - 2 VIEW COMPARISON:  Chest x-ray November 27, 2021 FINDINGS: The cardiomediastinal silhouette is unchanged in contour. Left basilar pulmonary opacity and small left pleural effusion. The right lung is clear. No large pneumothorax. Visualized upper abdomen is unremarkable. No acute osseous abnormality. IMPRESSION: 1. Small left basilar pulmonary opacity. Differential considerations include atelectasis, aspiration, or infection. 2. Small left pleural effusion. Electronically Signed   By: Beryle Flock M.D.   On: 01/13/2022 08:00     Assessment & Plan:   Bilateral vocal cord paralysis - [facility] with with [facility]. Laryngoscopy in October 2023 revealed mild diffuse erythema and modest posterior laryngeal edema. Vocal fold mobility is absent bilaterally. Dr. [name] discussed her treatment options which include tracheostomy, posterior cordotomy, observation only, medical speech evaluation. She is following with speech and practicing daily exercises.    Pulmonary mycobacterial infection ([place]) - No signs of active infection. She has mild-moderate bronchiectasis on imaging. Not a significant about of mucus burden. Encourage mucinex '600mg'$  daily. Consider adding hypertonic saline nebulizer or therapy vest if having recurrent respiratory infections or pneumonia. We can discontinue if either makes cough worse.   Chronic obstructive pulmonary disease ([place]) - Continue Trelegy 152mg one puff daily   EMartyn Ehrich NP 02/03/2022

## 2022-01-29 NOTE — Patient Instructions (Addendum)
CT chest showed scarring from chronic aspiration, no active infection right now I will discuss with Dr. Chase Caller if he thinks a percussion vest could help, we may only want to do this if you are having recurrent pneumonia. Vocal rest is probably the best thing  Call if you develop increased cough, sputum production, purulent or foul tasting sputum, fevers  Recommendations: Continue vocal exercises Take Mucinex '600mg'$  once daily (stop if worsening cough) Use flutter valve 2-3 times a day to loosen congestion   Orders: Flutter valve   Orders: High dose flu shot   Follow-up: 3 months with Dr. Chase Caller    Aspiration Precautions, Adult Aspiration is when a person breathes in (inhales) a liquid or other material, and it goes into the lungs. Adults who have conditions that affect their brain or spinal cord or who have trouble swallowing, a decreased gag reflex, or trouble moving around (mobility) are at risk for this condition. Things that can be inhaled into the lungs include: Food. Any type of liquid. This includes drinks and saliva. Stomach contents. This includes vomit and stomach acid. This condition can cause an infection in the lungs (pneumonia). Certain steps can be taken to reduce the risk of aspiration. What are the signs of aspiration? Signs may include: Coughing. A person may: Cough after they swallow food or liquids. Cough up mucus (sputum) that is yellow, tan, or green. It may also smell bad or have pieces of food in it. Cough when lying down or have to sit up quickly after lying down. Have a hoarse, barky cough. Trouble breathing. This may include: Breathing very quickly or very slowly. Loud breathing. High-pitched whistling sounds during breathing, most often when breathing out (wheezing). Trouble eating. This may involve: Clearing the throat often while eating. Drooling while eating. Having a feeling of fullness in the throat or like something is stuck in the  throat. Having a runny nose and watery eyes while eating. Speaking problems. This may include a hoarse voice or inability to speak. Choking often while eating. Changes in skin color. The skin may look red or blue. Pain in the chest or back during or right after eating. What problems can aspiration cause? This condition can cause problems such as: Losing weight because the person is not absorbing the nutrients they need. Loss of enjoyment and the social benefits of eating. Choking. Lung irritation. This can happen if someone aspirates acidic food or drinks. Pneumonia. Lung abscess. This is a collection of infected liquid (pus) in the lungs. In serious cases, death can occur. What can I do to prevent aspiration? Caring for someone who has a feeding tube If you are caring for someone who has a feeding tube and cannot eat or drink safely by mouth: Keep the person in an upright position as much as possible. Do not lay the person flat if they get feedings around the clock (continuous feedings). If you need to lay the person flat for any reason, turn the feeding pump off. Check for left over liquid (residuals) in the stomach as told by the health care provider. Ask the health care provider what residual amount is too high. Caring for someone who can eat and drink safely by mouth If you are caring for someone who can eat and drink safely by mouth: Have the person sit in an upright position when they eat or drink. This can be done by: Having the person sit up in a chair. Positioning the person in bed so they are upright.  This can be done if sitting in a chair is not possible. Remind the person to eat slowly and chew well. Make sure the person is awake and alert while eating. Never put food or liquids in the mouth of a person who is not fully alert. Do not distract the person, especially if they have problems with thinking or memory (cognitive problems). Allow foods to cool. Hot foods may be harder  to swallow. Provide small meals often rather than three large meals. This may help the person to not feel tired when eating. After the person is done eating: Check their mouth well for leftover food. Keep them sitting upright for 30-45 minutes. Do not serve food or drink within 2 hours of bedtime. General instructions To prevent aspiration in someone who can eat and drink safely by mouth: Feed small bites of food. Do not force-feed. If the person is on a diet because of problems with swallowing (dysphagia diet), follow the food and drink consistency that the health care provider recommends. You may be told to thicken a liquid using a thickening product. Use as little water as possible when brushing the person's teeth or cleaning their mouth. Provide oral care before and after meals. Use adaptive devices as told by the health care provider. These may include cut-out cups or other utensils. Crush pills and put them in soft food, such as pudding or ice cream. Some pills should not be crushed. Check with the health care provider before crushing any medicine. If the person is choking on food or an object, do abdominal thrusts.  Contact a health care provider if: The person has a feeding tube, and the feeding tube residual amount is too high. The person tries to avoid food or water. They may refuse to eat, drink, or be fed, or they may eat less than normal. The person may have aspirated food or liquid. The person shows warning signs of aspiration. These include choking or coughing when they eat or drink. The person has symptoms of pneumonia. These may include: Coughing a lot or coughing up mucus with a bad smell or blood in it. A long-lasting (chronic) cough. Shortness of breath or wheezing. Chest or back pain. Sweating, fever, or chills. Get help right away if: The person has trouble breathing or starts to breathe fast. The person is breathing very slowly or stops breathing. The person  cannot stop choking. The person turns blue, faints, or seems confused. These symptoms may be an emergency. Get help right away. Call 911. Do not wait to see if the symptoms will go away. This information is not intended to replace advice given to you by your health care provider. Make sure you discuss any questions you have with your health care provider. Document Revised: 08/21/2021 Document Reviewed: 08/21/2021 Elsevier Patient Education  [place].

## 2022-01-30 ENCOUNTER — Other Ambulatory Visit (INDEPENDENT_AMBULATORY_CARE_PROVIDER_SITE_OTHER): Payer: Medicare Other

## 2022-01-30 ENCOUNTER — Other Ambulatory Visit: Payer: Self-pay | Admitting: Nurse Practitioner

## 2022-01-30 DIAGNOSIS — E871 Hypo-osmolality and hyponatremia: Secondary | ICD-10-CM | POA: Diagnosis not present

## 2022-01-30 LAB — BASIC METABOLIC PANEL
BUN: 15 mg/dL (ref 6–23)
CO2: 30 mEq/L (ref 19–32)
Calcium: 9.5 mg/dL (ref 8.4–10.5)
Chloride: 97 mEq/L (ref 96–112)
Creatinine, Ser: 0.99 mg/dL (ref 0.40–1.20)
GFR: 58.17 mL/min — ABNORMAL LOW (ref >60.00)
Glucose, Bld: 109 mg/dL — ABNORMAL HIGH (ref 70–99)
Potassium: 4 mEq/L (ref 3.5–5.1)
Sodium: 135 mEq/L (ref 135–145)

## 2022-01-31 ENCOUNTER — Encounter: Payer: Self-pay | Admitting: Internal Medicine

## 2022-02-01 ENCOUNTER — Other Ambulatory Visit: Payer: Self-pay | Admitting: Internal Medicine

## 2022-02-03 ENCOUNTER — Telehealth: Payer: Self-pay | Admitting: Primary Care

## 2022-02-03 NOTE — Assessment & Plan Note (Addendum)
-   No signs of active infection. She has mild-moderate bronchiectasis on imaging. Not a significant about of mucus burden. Encourage mucinex '600mg'$  daily. Consider adding hypertonic saline nebulizer or therapy vest if having recurrent respiratory infections or pneumonia. We can discontinue if either makes cough worse.

## 2022-02-03 NOTE — Telephone Encounter (Signed)
Hx MS and VCD. She has bronchiectasis on imaging. Do you recommend cough suppression or should she try something like a therapy vest?

## 2022-02-03 NOTE — Assessment & Plan Note (Signed)
-   Continue Trelegy 119mg one puff daily

## 2022-02-03 NOTE — Telephone Encounter (Signed)
Sounds goo. LEts do that

## 2022-02-03 NOTE — Assessment & Plan Note (Addendum)
-   Following with with [facility]. Laryngoscopy in October 2023 revealed mild diffuse erythema and modest posterior laryngeal edema. Vocal fold mobility is absent bilaterally. Dr. [name] discussed her treatment options which include tracheostomy, posterior cordotomy, observation only, medical speech evaluation. She is following with speech and practicing daily exercises.

## 2022-02-03 NOTE — Telephone Encounter (Signed)
She has chronic cough and wheezing symptoms. She had frequent throat clearing while in office but did not have a lot of congestion in her lungs. No signs of active infection. Symptoms were baseline and appear manageable for the most part overall. She was prescribed a saline inhaler, we did discuss hypertonic saline nebulizers. I can send those is and try. If it makes her symptoms worse we can stop

## 2022-02-03 NOTE — Telephone Encounter (Signed)
Do you think there is a lot of retained secretions?  She does have vocal cord paralysis and this could be making her cough more difficult.  We could start by trying 3% saline nebulizer?

## 2022-02-06 ENCOUNTER — Other Ambulatory Visit (INDEPENDENT_AMBULATORY_CARE_PROVIDER_SITE_OTHER): Payer: Medicare Other

## 2022-02-06 DIAGNOSIS — E871 Hypo-osmolality and hyponatremia: Secondary | ICD-10-CM | POA: Diagnosis not present

## 2022-02-06 LAB — BASIC METABOLIC PANEL
BUN: 17 mg/dL (ref 6–23)
CO2: 29 mEq/L (ref 19–32)
Calcium: 9.2 mg/dL (ref 8.4–10.5)
Chloride: 98 mEq/L (ref 96–112)
Creatinine, Ser: 0.99 mg/dL (ref 0.40–1.20)
GFR: 58.17 mL/min — ABNORMAL LOW (ref >60.00)
Glucose, Bld: 100 mg/dL — ABNORMAL HIGH (ref 70–99)
Potassium: 4.1 mEq/L (ref 3.5–5.1)
Sodium: 135 mEq/L (ref 135–145)

## 2022-02-21 DIAGNOSIS — Z1231 Encounter for screening mammogram for malignant neoplasm of breast: Secondary | ICD-10-CM | POA: Diagnosis not present

## 2022-02-21 LAB — HM MAMMOGRAPHY

## 2022-02-26 ENCOUNTER — Ambulatory Visit (INDEPENDENT_AMBULATORY_CARE_PROVIDER_SITE_OTHER): Payer: Medicare Other | Admitting: Internal Medicine

## 2022-02-26 ENCOUNTER — Encounter: Payer: Self-pay | Admitting: Internal Medicine

## 2022-02-26 VITALS — BP 130/80 | HR 76 | Temp 97.6°F | Resp 16 | Ht 63.0 in | Wt 109.0 lb

## 2022-02-26 DIAGNOSIS — E871 Hypo-osmolality and hyponatremia: Secondary | ICD-10-CM

## 2022-02-26 DIAGNOSIS — I75023 Atheroembolism of bilateral lower extremities: Secondary | ICD-10-CM | POA: Diagnosis not present

## 2022-02-26 DIAGNOSIS — R8290 Unspecified abnormal findings in urine: Secondary | ICD-10-CM | POA: Diagnosis not present

## 2022-02-26 DIAGNOSIS — N390 Urinary tract infection, site not specified: Secondary | ICD-10-CM | POA: Diagnosis not present

## 2022-02-26 DIAGNOSIS — N3000 Acute cystitis without hematuria: Secondary | ICD-10-CM | POA: Diagnosis not present

## 2022-02-26 LAB — URINALYSIS, ROUTINE W REFLEX MICROSCOPIC
Bilirubin Urine: NEGATIVE
Ketones, ur: NEGATIVE
Nitrite: NEGATIVE
Specific Gravity, Urine: 1.01 (ref 1.000–1.030)
Total Protein, Urine: NEGATIVE
Urine Glucose: NEGATIVE
Urobilinogen, UA: 0.2 (ref 0.0–1.0)
pH: 7 (ref 5.0–8.0)

## 2022-02-26 LAB — BASIC METABOLIC PANEL
BUN: 15 mg/dL (ref 6–23)
CO2: 29 mEq/L (ref 19–32)
Calcium: 9.6 mg/dL (ref 8.4–10.5)
Chloride: 98 mEq/L (ref 96–112)
Creatinine, Ser: 0.83 mg/dL (ref 0.40–1.20)
GFR: 71.84 mL/min (ref >60.00)
Glucose, Bld: 104 mg/dL — ABNORMAL HIGH (ref 70–99)
Potassium: 3.8 mEq/L (ref 3.5–5.1)
Sodium: 135 mEq/L (ref 135–145)

## 2022-02-26 NOTE — Progress Notes (Addendum)
Subjective:  Patient ID: Kayla [name], female    DOB: 02/27/1951  Age: 71 y.o. MRN: [id number]  CC: Urinary Tract Infection   HPI Kayla [name] presents for f/up-  She complains of a one week history of cloudy urine.   Outpatient Medications Prior to Visit  Medication Sig Dispense Refill   albuterol (VENTOLIN HFA) 108 (90 Base) MCG/ACT inhaler INHALE 2 PUFFS INTO LUNGS EVERY 6 HOURS AS NEEDED. 18 g 0   baclofen (LIORESAL) 20 MG tablet Take 20 mg by mouth 3 (three) times daily as needed for muscle spasms.     CALCIUM PO Take 1 tablet by mouth daily.     dexlansoprazole (DEXILANT) 60 MG capsule TAKE 1 CAPSULE BY MOUTH  ONCE DAILY WITH BREAKFAST 90 capsule 3   famotidine (PEPCID) 20 MG tablet One after supper 30 tablet 11   Ferric Maltol (ACCRUFER) 30 MG CAPS Take 1 capsule by mouth in the morning and at bedtime. 180 capsule 0   FLUoxetine (PROZAC) 40 MG capsule Take 40 mg by mouth daily.     furosemide (LASIX) 20 MG tablet TAKE 1 TABLET BY MOUTH  DAILY 90 tablet 3   Immune Globulin, Human,-klhw (XEMBIFY [place]) Inject into the skin.     levothyroxine (SYNTHROID) 100 MCG tablet TAKE 1 TABLET BY MOUTH ONCE  DAILY BEFORE BREAKFAST 90 tablet 3   Multiple Vitamin (MULTIVITAMIN WITH MINERALS) TABS tablet Take 1 tablet by mouth daily.     pilocarpine (SALAGEN) 7.5 MG tablet Take 7.5 mg by mouth 2 (two) times daily.     Probiotic Product (PROBIOTIC PO) Take 1 capsule by mouth daily. Harmony probiotic     Respiratory Therapy Supplies (FLUTTER) DEVI Use as directed 1 each 0   tamsulosin (FLOMAX) 0.4 MG CAPS capsule Take 0.4 mg by mouth daily.     temazepam (RESTORIL) 15 MG capsule Take 1 capsule (15 mg total) by mouth at bedtime. 30 capsule 2   TRELEGY ELLIPTA 100-62.5-25 MCG/ACT AEPB Inhale 1 puff into the lungs daily. 120 each 1   trimethoprim (TRIMPEX) 100 MG tablet Take 100 mg by mouth daily.     amphetamine-dextroamphetamine (ADDERALL) 20 MG tablet Take 1 tablet (20 mg total) by mouth  daily. 30 tablet 0   promethazine-dextromethorphan (PROMETHAZINE-DM) 6.25-15 MG/5ML syrup Take 5 mLs by mouth 4 (four) times daily as needed for cough. 240 mL 0   RSV vaccine recomb adjuvanted (AREXVY) 120 MCG/0.5ML injection Inject into the muscle. 0.5 mL 0   predniSONE (DELTASONE) 20 MG tablet Take 2 tablets (40 mg total) by mouth daily with breakfast. 10 tablet 0   No facility-administered medications prior to visit.    ROS Review of Systems  Constitutional: Negative.  Negative for chills, fatigue and fever.  HENT:  Positive for trouble swallowing.   Eyes: Negative.   Respiratory:  Positive for cough and shortness of breath. Negative for chest tightness and wheezing.   Cardiovascular:  Positive for leg swelling (ankle edema). Negative for chest pain and palpitations.  Gastrointestinal:  Negative for abdominal pain, constipation, diarrhea, nausea and vomiting.  Endocrine: Negative.   Genitourinary:  Positive for difficulty urinating and dysuria. Negative for urgency.  Musculoskeletal: Negative.   Skin: Negative.   Neurological: Negative.  Negative for dizziness, weakness, light-headedness and numbness.  Hematological:  Negative for adenopathy. Does not bruise/bleed easily.  Psychiatric/Behavioral: Negative.      Objective:  BP 130/80 (BP Location: Left Arm, Patient Position: Sitting, Cuff Size: Small)   Pulse 76  Temp 97.6 F (36.4 C) (Oral)   Resp 16   Ht '5\' 3"'$  (1.6 m)   Wt 109 lb (49.4 kg)   SpO2 96%   BMI 19.31 kg/m   BP Readings from Last 3 Encounters:  02/26/22 130/80  01/29/22 110/68  01/16/22 98/72    Wt Readings from Last 3 Encounters:  02/26/22 109 lb (49.4 kg)  01/29/22 110 lb 6.4 oz (50.1 kg)  01/16/22 115 lb 12.8 oz (52.5 kg)    Physical Exam Vitals reviewed.  Constitutional:      General: She is not in acute distress.    Appearance: She is ill-appearing. She is not toxic-appearing or diaphoretic.  Eyes:     General: No scleral icterus.     Conjunctiva/sclera: Conjunctivae normal.  Cardiovascular:     Rate and Rhythm: Normal rate and regular rhythm.     Heart sounds: No murmur heard.    No gallop.  Pulmonary:     Effort: Pulmonary effort is normal.     Breath sounds: No stridor. No wheezing, rhonchi or rales.  Abdominal:     General: Abdomen is flat.     Palpations: There is no mass.     Tenderness: There is no abdominal tenderness. There is no guarding.     Hernia: No hernia is present.  Musculoskeletal:        General: Normal range of motion.     Cervical back: Neck supple.     Right lower leg: Edema (ankle edema) present.     Left lower leg: Edema (ankle edema) present.  Lymphadenopathy:     Cervical: No cervical adenopathy.  Skin:    General: Skin is warm and dry.  Neurological:     General: No focal deficit present.  Psychiatric:        Mood and Affect: Mood normal.     Lab Results  Component Value Date   WBC 7.4 01/10/2022   HGB 12.4 01/10/2022   HCT 37.3 01/10/2022   PLT 218.0 01/10/2022   GLUCOSE 104 (H) 02/26/2022   CHOL 169 01/14/2022   TRIG 157.0 (H) 01/14/2022   HDL 41.50 01/14/2022   LDLDIRECT 116.1 06/25/2010   LDLCALC 96 01/14/2022   ALT 22 01/14/2022   AST 30 01/14/2022   NA 135 02/26/2022   K 3.8 02/26/2022   CL 98 02/26/2022   CREATININE 0.83 02/26/2022   BUN 15 02/26/2022   CO2 29 02/26/2022   TSH 2.81 01/10/2022   INR 1.0 10/25/2020   HGBA1C 5.6 02/25/2013    CT Chest Wo Contrast  Result Date: 01/22/2022 CLINICAL DATA:  Persistent cough. COPD. History of cervical cancer. * Tracking Code: BO * EXAM: CT CHEST WITHOUT CONTRAST TECHNIQUE: Multidetector CT imaging of the chest was performed following the standard protocol without IV contrast. RADIATION DOSE REDUCTION: This exam was performed according to the departmental dose-optimization program which includes automated exposure control, adjustment of the mA and/or kV according to patient size and/or use of iterative reconstruction  technique. COMPARISON:  07/29/2021. FINDINGS: Cardiovascular: Atherosclerotic calcification of the aorta. Heart size normal. Small amount of pericardial fluid may be physiologic. Mediastinum/Nodes: 1.8 cm low-attenuation lesion in the inferior left thyroid. No pathologically enlarged mediastinal or axillary lymph nodes. Calcified bihilar lymph nodes. Hilar regions are otherwise difficult to definitively evaluate without IV contrast. Esophagus is mildly dilated and air-filled throughout its course, which can be seen with dysmotility. Lungs/Pleura: Biapical pleuroparenchymal scarring. Mild to moderate cylindrical bronchiectasis. Scattered pulmonary parenchymal scarring. Bronchial debris and  mucoid impaction in the left lower lobe with peribronchovascular nodularity and mild architectural distortion. No pleural fluid. Airway is unremarkable. Upper Abdomen: Low-attenuation lesions in the liver measure up to 12 mm and are likely cysts. Visualized portions of the liver, adrenal glands, kidneys, spleen, pancreas, stomach and bowel are otherwise unremarkable. Musculoskeletal: Degenerative changes in the spine. No worrisome lytic or sclerotic lesions. IMPRESSION: 1. Left lower lobe bronchial debris, mucoid impaction, peribronchovascular nodularity and mild architectural distortion, findings which may represent acute on chronic aspiration. An infectious bronchiolitis superimposed on postinfectious/postinflammatory scarring is not excluded. 2. Mild to moderate cylindrical bronchiectasis. 3. 1.8 cm low-attenuation left thyroid nodule. Previous thyroid ultrasound 07/21/2017. No follow-up recommended unless clinically warranted. (Ref: J Am Coll Radiol. 2015 Feb;12(2): 143-50). 4.  Aortic atherosclerosis (ICD10-I70.0). Electronically Signed   By: Lorin Picket M.D.   On: 01/22/2022 09:00    Assessment & Plan:   Kandance was seen today for urinary tract infection.  Diagnoses and all orders for this visit:  Hyponatremia -      Basic metabolic panel; Future -     Basic metabolic panel  Cloudy urine -     Urinalysis, Routine w reflex microscopic; Future -     CULTURE, URINE COMPREHENSIVE; Future -     CULTURE, URINE COMPREHENSIVE -     Urinalysis, Routine w reflex microscopic  Acute cystitis without hematuria- Urine clx is positive for E coli, sensitive to nitrofurantoin. -     nitrofurantoin, macrocrystal-monohydrate, (MACROBID) 100 MG capsule; Take 1 capsule (100 mg total) by mouth 2 (two) times daily for 5 days.  Recurrent UTI (urinary tract infection) -     methenamine (HIPREX) 1 g tablet; Take 1 tablet (1 g total) by mouth 2 (two) times daily with a meal.   I have discontinued Naomi H. Limbach's promethazine-dextromethorphan, Arexvy, and predniSONE. I am also having her start on nitrofurantoin (macrocrystal-monohydrate) and methenamine. Additionally, I am having her maintain her FLUoxetine, baclofen, multivitamin with minerals, Flutter, famotidine, tamsulosin, pilocarpine, Probiotic Product (PROBIOTIC PO), CALCIUM PO, albuterol, (Immune Globulin, Human,-klhw (XEMBIFY [place])), dexlansoprazole, furosemide, trimethoprim, ACCRUFeR, levothyroxine, Trelegy Ellipta, and temazepam.  Meds ordered this encounter  Medications   nitrofurantoin, macrocrystal-monohydrate, (MACROBID) 100 MG capsule    Sig: Take 1 capsule (100 mg total) by mouth 2 (two) times daily for 5 days.    Dispense:  10 capsule    Refill:  0   methenamine (HIPREX) 1 g tablet    Sig: Take 1 tablet (1 g total) by mouth 2 (two) times daily with a meal.    Dispense:  180 tablet    Refill:  1     Follow-up: Return if symptoms worsen or fail to improve.  Scarlette Calico, MD

## 2022-02-26 NOTE — Patient Instructions (Signed)
Urinary Tract Infection, Adult  A urinary tract infection (UTI) is an infection of any part of the urinary tract. The urinary tract includes the kidneys, ureters, bladder, and urethra. These organs make, store, and get rid of urine in the body. An upper UTI affects the ureters and kidneys. A lower UTI affects the bladder and urethra. What are the causes? Most urinary tract infections are caused by bacteria in your genital area around your urethra, where urine leaves your body. These bacteria grow and cause inflammation of your urinary tract. What increases the risk? You are more likely to develop this condition if: You have a urinary catheter that stays in place. You are not able to control when you urinate or have a bowel movement (incontinence). You are female and you: Use a spermicide or diaphragm for birth control. Have low estrogen levels. Are pregnant. You have certain genes that increase your risk. You are sexually active. You take antibiotic medicines. You have a condition that causes your flow of urine to slow down, such as: An enlarged prostate, if you are female. Blockage in your urethra. A kidney stone. A nerve condition that affects your bladder control (neurogenic bladder). Not getting enough to drink, or not urinating often. You have certain medical conditions, such as: Diabetes. A weak disease-fighting system (immunesystem). Sickle cell disease. Gout. Spinal cord injury. What are the signs or symptoms? Symptoms of this condition include: Needing to urinate right away (urgency). Frequent urination. This may include small amounts of urine each time you urinate. Pain or burning with urination. Blood in the urine. Urine that smells bad or unusual. Trouble urinating. Cloudy urine. Vaginal discharge, if you are female. Pain in the abdomen or the lower back. You may also have: Vomiting or a decreased appetite. Confusion. Irritability or tiredness. A fever or  chills. Diarrhea. The first symptom in older adults may be confusion. In some cases, they may not have any symptoms until the infection has worsened. How is this diagnosed? This condition is diagnosed based on your medical history and a physical exam. You may also have other tests, including: Urine tests. Blood tests. Tests for STIs (sexually transmitted infections). If you have had more than one UTI, a cystoscopy or imaging studies may be done to determine the cause of the infections. How is this treated? Treatment for this condition includes: Antibiotic medicine. Over-the-counter medicines to treat discomfort. Drinking enough water to stay hydrated. If you have frequent infections or have other conditions such as a kidney stone, you may need to see a health care provider who specializes in the urinary tract (urologist). In rare cases, urinary tract infections can cause sepsis. Sepsis is a life-threatening condition that occurs when the body responds to an infection. Sepsis is treated in the hospital with IV antibiotics, fluids, and other medicines. Follow these instructions at home:  Medicines Take over-the-counter and prescription medicines only as told by your health care provider. If you were prescribed an antibiotic medicine, take it as told by your health care provider. Do not stop using the antibiotic even if you start to feel better. General instructions Make sure you: Empty your bladder often and completely. Do not hold urine for long periods of time. Empty your bladder after sex. Wipe from front to back after urinating or having a bowel movement if you are female. Use each tissue only one time when you wipe. Drink enough fluid to keep your urine pale yellow. Keep all follow-up visits. This is important. Contact a health   care provider if: Your symptoms do not get better after 1-2 days. Your symptoms go away and then return. Get help right away if: You have severe pain in  your back or your lower abdomen. You have a fever or chills. You have nausea or vomiting. Summary A urinary tract infection (UTI) is an infection of any part of the urinary tract, which includes the kidneys, ureters, bladder, and urethra. Most urinary tract infections are caused by bacteria in your genital area. Treatment for this condition often includes antibiotic medicines. If you were prescribed an antibiotic medicine, take it as told by your health care provider. Do not stop using the antibiotic even if you start to feel better. Keep all follow-up visits. This is important. This information is not intended to replace advice given to you by your health care provider. Make sure you discuss any questions you have with your health care provider. Document Revised: 10/21/2019 Document Reviewed: 10/21/2019 Elsevier Patient Education  2023 [employer]  

## 2022-02-27 ENCOUNTER — Other Ambulatory Visit (HOSPITAL_BASED_OUTPATIENT_CLINIC_OR_DEPARTMENT_OTHER): Payer: Self-pay

## 2022-02-27 ENCOUNTER — Other Ambulatory Visit: Payer: Self-pay

## 2022-02-27 ENCOUNTER — Other Ambulatory Visit: Payer: Self-pay | Admitting: Internal Medicine

## 2022-02-27 DIAGNOSIS — R5382 Chronic fatigue, unspecified: Secondary | ICD-10-CM

## 2022-02-27 DIAGNOSIS — G35 Multiple sclerosis: Secondary | ICD-10-CM

## 2022-02-27 MED ORDER — AMPHETAMINE-DEXTROAMPHETAMINE 20 MG PO TABS
20.0000 mg | ORAL_TABLET | Freq: Every day | ORAL | 0 refills | Status: DC
  Filled 2022-02-27: qty 30, 30d supply, fill #0

## 2022-02-28 ENCOUNTER — Encounter: Payer: Self-pay | Admitting: Internal Medicine

## 2022-02-28 MED ORDER — NITROFURANTOIN MONOHYD MACRO 100 MG PO CAPS: 100.0000 mg | ORAL_CAPSULE | Freq: Two times a day (BID) | ORAL | 0 refills | Status: AC

## 2022-03-01 DIAGNOSIS — N3000 Acute cystitis without hematuria: Secondary | ICD-10-CM | POA: Insufficient documentation

## 2022-03-01 LAB — CULTURE, URINE COMPREHENSIVE

## 2022-03-01 MED ORDER — METHENAMINE HIPPURATE 1 G PO TABS: 1.0000 g | ORAL_TABLET | Freq: Two times a day (BID) | ORAL | 1 refills | Status: DC

## 2022-03-11 MED ORDER — SODIUM CHLORIDE 3 % IN NEBU: INHALATION_SOLUTION | Freq: Two times a day (BID) | RESPIRATORY_TRACT | 1 refills | Status: DC | PRN

## 2022-03-11 NOTE — Telephone Encounter (Signed)
Please let patient know I'm going to send in hypertonic saline nebulizer to pharmacy, she should use twice daily to loosen cough- if worsens than stop. We will need to send in DME order for nebulizer machine I think

## 2022-03-11 NOTE — Addendum Note (Signed)
Addended by: Martyn Ehrich on: 03/11/2022 10:32 AM   Modules accepted: Orders

## 2022-03-12 DIAGNOSIS — J3802 Paralysis of vocal cords and larynx, bilateral: Secondary | ICD-10-CM | POA: Diagnosis not present

## 2022-03-12 DIAGNOSIS — R053 Chronic cough: Secondary | ICD-10-CM | POA: Diagnosis not present

## 2022-03-12 DIAGNOSIS — R490 Dysphonia: Secondary | ICD-10-CM | POA: Diagnosis not present

## 2022-03-12 NOTE — Telephone Encounter (Signed)
Called and spoke with pt letting her know the info per BW. Pt verbalized understanding. Pt already has a nebulizer machine so does not need an order for one to be sent in. While had pt on phone, scheduled her a follow up with MR. Nothing further needed.

## 2022-03-14 ENCOUNTER — Other Ambulatory Visit: Payer: Self-pay | Admitting: Internal Medicine

## 2022-03-14 DIAGNOSIS — D508 Other iron deficiency anemias: Secondary | ICD-10-CM

## 2022-03-18 MED ORDER — ACCRUFER 30 MG PO CAPS: 1.0000 | ORAL_CAPSULE | Freq: Two times a day (BID) | ORAL | 0 refills | Status: DC

## 2022-03-28 DIAGNOSIS — S42211D Unspecified displaced fracture of surgical neck of right humerus, subsequent encounter for fracture with routine healing: Secondary | ICD-10-CM | POA: Diagnosis not present

## 2022-03-28 DIAGNOSIS — G35 Multiple sclerosis: Secondary | ICD-10-CM | POA: Diagnosis not present

## 2022-03-28 DIAGNOSIS — R296 Repeated falls: Secondary | ICD-10-CM | POA: Diagnosis not present

## 2022-03-28 DIAGNOSIS — R260 Ataxic gait: Secondary | ICD-10-CM | POA: Diagnosis not present

## 2022-04-01 DIAGNOSIS — R296 Repeated falls: Secondary | ICD-10-CM | POA: Diagnosis not present

## 2022-04-01 DIAGNOSIS — R260 Ataxic gait: Secondary | ICD-10-CM | POA: Diagnosis not present

## 2022-04-01 DIAGNOSIS — G35 Multiple sclerosis: Secondary | ICD-10-CM | POA: Diagnosis not present

## 2022-04-01 DIAGNOSIS — S42211D Unspecified displaced fracture of surgical neck of right humerus, subsequent encounter for fracture with routine healing: Secondary | ICD-10-CM | POA: Diagnosis not present

## 2022-04-03 DIAGNOSIS — R260 Ataxic gait: Secondary | ICD-10-CM | POA: Diagnosis not present

## 2022-04-03 DIAGNOSIS — S42211D Unspecified displaced fracture of surgical neck of right humerus, subsequent encounter for fracture with routine healing: Secondary | ICD-10-CM | POA: Diagnosis not present

## 2022-04-03 DIAGNOSIS — R296 Repeated falls: Secondary | ICD-10-CM | POA: Diagnosis not present

## 2022-04-03 DIAGNOSIS — G35 Multiple sclerosis: Secondary | ICD-10-CM | POA: Diagnosis not present

## 2022-04-08 ENCOUNTER — Encounter: Payer: Self-pay | Admitting: Internal Medicine

## 2022-04-08 DIAGNOSIS — S42211D Unspecified displaced fracture of surgical neck of right humerus, subsequent encounter for fracture with routine healing: Secondary | ICD-10-CM | POA: Diagnosis not present

## 2022-04-08 DIAGNOSIS — R296 Repeated falls: Secondary | ICD-10-CM | POA: Diagnosis not present

## 2022-04-08 DIAGNOSIS — G35 Multiple sclerosis: Secondary | ICD-10-CM | POA: Diagnosis not present

## 2022-04-08 DIAGNOSIS — R260 Ataxic gait: Secondary | ICD-10-CM | POA: Diagnosis not present

## 2022-04-09 ENCOUNTER — Other Ambulatory Visit: Payer: Self-pay | Admitting: Internal Medicine

## 2022-04-09 DIAGNOSIS — F3341 Major depressive disorder, recurrent, in partial remission: Secondary | ICD-10-CM

## 2022-04-09 MED ORDER — FLUOXETINE HCL 40 MG PO CAPS: 40.0000 mg | ORAL_CAPSULE | Freq: Every day | ORAL | 1 refills | Status: DC

## 2022-04-15 DIAGNOSIS — G35 Multiple sclerosis: Secondary | ICD-10-CM | POA: Diagnosis not present

## 2022-04-15 DIAGNOSIS — R296 Repeated falls: Secondary | ICD-10-CM | POA: Diagnosis not present

## 2022-04-15 DIAGNOSIS — R260 Ataxic gait: Secondary | ICD-10-CM | POA: Diagnosis not present

## 2022-04-15 DIAGNOSIS — S42211D Unspecified displaced fracture of surgical neck of right humerus, subsequent encounter for fracture with routine healing: Secondary | ICD-10-CM | POA: Diagnosis not present

## 2022-04-16 DIAGNOSIS — C44612 Basal cell carcinoma of skin of right upper limb, including shoulder: Secondary | ICD-10-CM | POA: Diagnosis not present

## 2022-04-16 DIAGNOSIS — L570 Actinic keratosis: Secondary | ICD-10-CM | POA: Diagnosis not present

## 2022-04-16 DIAGNOSIS — D2372 Other benign neoplasm of skin of left lower limb, including hip: Secondary | ICD-10-CM | POA: Diagnosis not present

## 2022-04-16 DIAGNOSIS — D225 Melanocytic nevi of trunk: Secondary | ICD-10-CM | POA: Diagnosis not present

## 2022-04-16 DIAGNOSIS — Z85828 Personal history of other malignant neoplasm of skin: Secondary | ICD-10-CM | POA: Diagnosis not present

## 2022-04-16 DIAGNOSIS — L814 Other melanin hyperpigmentation: Secondary | ICD-10-CM | POA: Diagnosis not present

## 2022-04-16 DIAGNOSIS — L821 Other seborrheic keratosis: Secondary | ICD-10-CM | POA: Diagnosis not present

## 2022-04-17 DIAGNOSIS — S42211D Unspecified displaced fracture of surgical neck of right humerus, subsequent encounter for fracture with routine healing: Secondary | ICD-10-CM | POA: Diagnosis not present

## 2022-04-17 DIAGNOSIS — G35 Multiple sclerosis: Secondary | ICD-10-CM | POA: Diagnosis not present

## 2022-04-17 DIAGNOSIS — R296 Repeated falls: Secondary | ICD-10-CM | POA: Diagnosis not present

## 2022-04-17 DIAGNOSIS — R260 Ataxic gait: Secondary | ICD-10-CM | POA: Diagnosis not present

## 2022-04-18 ENCOUNTER — Other Ambulatory Visit (HOSPITAL_BASED_OUTPATIENT_CLINIC_OR_DEPARTMENT_OTHER): Payer: Self-pay

## 2022-04-18 ENCOUNTER — Other Ambulatory Visit: Payer: Self-pay | Admitting: Internal Medicine

## 2022-04-18 DIAGNOSIS — R5382 Chronic fatigue, unspecified: Secondary | ICD-10-CM

## 2022-04-18 DIAGNOSIS — G35 Multiple sclerosis: Secondary | ICD-10-CM

## 2022-04-18 MED ORDER — AMPHETAMINE-DEXTROAMPHETAMINE 20 MG PO TABS
20.0000 mg | ORAL_TABLET | Freq: Every day | ORAL | 0 refills | Status: DC
  Filled 2022-04-18: qty 30, 30d supply, fill #0

## 2022-04-22 DIAGNOSIS — R296 Repeated falls: Secondary | ICD-10-CM | POA: Diagnosis not present

## 2022-04-22 DIAGNOSIS — S42211D Unspecified displaced fracture of surgical neck of right humerus, subsequent encounter for fracture with routine healing: Secondary | ICD-10-CM | POA: Diagnosis not present

## 2022-04-22 DIAGNOSIS — G35 Multiple sclerosis: Secondary | ICD-10-CM | POA: Diagnosis not present

## 2022-04-22 DIAGNOSIS — R260 Ataxic gait: Secondary | ICD-10-CM | POA: Diagnosis not present

## 2022-04-24 DIAGNOSIS — S42211D Unspecified displaced fracture of surgical neck of right humerus, subsequent encounter for fracture with routine healing: Secondary | ICD-10-CM | POA: Diagnosis not present

## 2022-04-24 DIAGNOSIS — G35 Multiple sclerosis: Secondary | ICD-10-CM | POA: Diagnosis not present

## 2022-04-24 DIAGNOSIS — R260 Ataxic gait: Secondary | ICD-10-CM | POA: Diagnosis not present

## 2022-04-24 DIAGNOSIS — R296 Repeated falls: Secondary | ICD-10-CM | POA: Diagnosis not present

## 2022-04-28 DIAGNOSIS — H35373 Puckering of macula, bilateral: Secondary | ICD-10-CM | POA: Diagnosis not present

## 2022-04-28 DIAGNOSIS — G35 Multiple sclerosis: Secondary | ICD-10-CM | POA: Diagnosis not present

## 2022-04-29 ENCOUNTER — Other Ambulatory Visit: Payer: Self-pay | Admitting: Internal Medicine

## 2022-04-30 ENCOUNTER — Other Ambulatory Visit: Payer: Self-pay | Admitting: Internal Medicine

## 2022-05-01 DIAGNOSIS — S42211D Unspecified displaced fracture of surgical neck of right humerus, subsequent encounter for fracture with routine healing: Secondary | ICD-10-CM | POA: Diagnosis not present

## 2022-05-01 DIAGNOSIS — G35 Multiple sclerosis: Secondary | ICD-10-CM | POA: Diagnosis not present

## 2022-05-01 DIAGNOSIS — R260 Ataxic gait: Secondary | ICD-10-CM | POA: Diagnosis not present

## 2022-05-01 DIAGNOSIS — R296 Repeated falls: Secondary | ICD-10-CM | POA: Diagnosis not present

## 2022-05-04 NOTE — Patient Instructions (Signed)
ICD-10-CM   1. Chronic cough  R05.3 Nitric oxide    2. Vocal cord paralysis  J38.00     3. 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

## 2022-05-04 NOTE — Progress Notes (Unsigned)
[name] consult 10/25/20  72 year old female with chronic cough.  She is a longstanding patient of Dr. Christinia Gully.  Since [id number] she has had multiple sclerosis.  She had a hip fracture in 2004 and after that she has used a walker.  In 2006 she had a EGD with 3 cm hiatal hernia.  She is always had a stridorous noise with deep inspiration.  She has a 15-year history of dysphagia.  2008 at esophagogram was normal.  In May 2022 she saw Dr. Melida Quitter and was noticed to have bilateral vocal fold paralysis.  She was admitted 09/27/2020 with history of fever but also chronic cough for 4 months.  She had failed outpatient antibiotics and steroids.  CT chest showed some scattered groundglass opacities [personally visualized] particularly in the left lower lobe.  She had a blood gas 09/29/2020 without any hypercapnia.  She had a ESR of 44.  She had normal procalcitonin.  She had serology that was negative in July 2022.  During this time MRSA PCR, COVID and flu were negative.  Respiratory virus panel was also negative.  Blood cultures were no growth.  The urine Streptococcus and Legionella was negative in July 2022.  She was discharged on 09/29/2020 with outpatient follow-up with GI and pulmonary.  No antibiotics or steroids were prescribed.   At follow-up she presented with Dr. Lucio Edward 10/04/2020 and GI.  At this time she was already on Dexilant and Pepcid.  She underwent a swallow study on 10/15/2020 and it is unchanged compared to baseline.  He is able to swallow well without any aspiration using compensatory mechanisms.   She then presented via the ED on 10/23/2020 because of continued worsening of cough.  She also reports chronic bronchorrhea.  She coughs at least 10 times sputum and each time quite a bit.  It is clear in color.  Has never been measured in the sputum cup.  She complained of ongoing cough and ongoing acid reflux.  [GI endoscopy is pending at this time] she had a chest x-ray in the ED the  representative slice it is worsening compared to 1 month ago.  No CT scan available.  No leukocytosis no fever no hypoxemia.  Cough is worse when she lies flat.  EDP started her on a short course steroid orally.   She was then admitted by the hospitalist who is now consulted [place].  She is frustrated by her continued symptoms.   Noted that she has baseline anemia.  Her hemoglobin in May 2022 was 11.9 g%.  But July dropped to 10.2 g%.  And currently 7.3 g%.  She has worsening anemia. Results for ARMANDINA, BOEREMA (MRN [id number]) as of 10/24/2020 15:13   Ref. Range 09/27/2020 10:49 10/23/2020 13:28  Eosinophils Absolute Latest Ref Range: 0.0 - 0.5 K/uL 0.1 0.1   10/23/2020 -  8/3 CT chest - LLL consolidation and lingular infilktrare worse. Rt side infiltrates eter  8/4 - still with cough. D/w Dr Shellee Milo s - has mild insp stridor at rest due to bialteral VC paralysis. CT chest yesreday as above. PCT  < 0.1 ESR 80. Currently on pred 6m per day   OV NP 08/15/21  08/15/2021 Follow up : Asthma and Pneumonia  Patient returns for 1 month follow-up.  Over the last year patient has had recurrent episodes of pneumonia and bronchitis.  She was admitted in July 2022 for community-acquired pneumonia.  She was treated with IV antibiotics.  CT chest September 27, 2020 showed groundglass opacities in the right lower lobe and left lower lobe concerning for multifocal pneumonia.  She was readmitted in August 2022.  CT chest showed groundglass attenuation and waxing and waning groundglass.  Autoimmune work-up was negative.  She underwent bronchoscopy with negative fungal and AFB.  BAL cultures were positive for rare Streptococcus constellatus.  Sed rate was elevated at 80.  Treated with Antibiotics and Steroids for possible recurrent CAP vs BOOP/COP. Cytology showed reactive bronchial cells and benign lung tissue.  She was referred to immunology.  Her Ocrevus therapy for MS was stopped due to recurrent infections.  She received 8  weeks of Rocephin.  And she is now on weekly IVIG per immunology.  Patient has recently been reestablished with a new neurologist and is undergoing new testing with MRI. Patient is on Trelegy inhaler for her underlying asthma.  Patient's main complaint is she has ongoing cough and congestion. Patient has longstanding chronic cough upper airway irritation.  She has been evaluated by ENT Dr. [name].  She has known vocal fold paralysis, felt this is most likely due to her underlying MS.  She has intermittent stridor with this. Patient underwent modified barium swallow July 26 fifth 2022 that showed dysphagia in the pharyngeal phase.  No overt aspiration. Last visit patient was recommended on cough control regimen with Robitussin, Tessalon and flutter valve.  Patient says she has good and bad days.  Always has an ongoing cough that is quite productive at times.  We also talked about aspiration precautions. Last office visit . Sputum culture and AFB was not adequate  Sputum Fungal + heavy candida . and later  Mycocbaterium mucogenicum CT chest Jul 29, 2021 showed mild consolidation in the right upper lobe and patchy infiltrates in the mid to lower lung field on the left.  Frothy debris in the trachea and left mainstem bronchus questionable aspiration pneumonia.  She was treated with 1 week of Diflucan .  Patient denies any fever, discolored mucus.  Appetite is at baseline. We discussed aspiration precautions..   TEST/EVENTS :  CT chest September 27, 2020 showed groundglass opacities in the right lower lobe and left lower lobe concerning for multifocal pneumonia.  14 x 10 mm nodular density in the right middle lobe Modified barium swallow was negative   CT chest on August 3 showed groundglass attenuation consolidation with waxing and waning groundglass attenuation.  Autoimmune work-up was negative.  Fungal cultures were negative.  AFB sputum was negative.  BAL cultures showed rare Streptococcus Constellatus.   (Sensitive to ceftriaxone, levofloxacin and vancomycin.   intermediate to penicillin).  Sed rate was elevated at 80. Cytology showed reactive bronchial cells.  Benign lung tissue.    August 2022.  CT chest showed groundglass attenuation and waxing waning groundglass.  Autoimmune work-up was negative.  Fungal cultures AFB sputum was negative.  BAL cultures showed rare Streptococcus  Constellatus, sed rate was 80.  Cytology showed reactive bronchial cells and benign lung tissue.  Patient was referred to immunology   ENT consult 09/2015 -[name] - Vocal folds are immobile and in paramedian position. They are approximated such that voice comes out clearly but she has inspiratory stridor (ENT note - felt secondary most likely to MS    modified barium swallow July 26 fifth 2022 that showed dysphagia in the pharyngeal phase.  No overt aspiration.   CT chest Jul 29, 2021 mild apical scarring, mild consolidation in the medial aspect of the right upper lobe, patchy infiltrates in  the mid to lower left lung.   May 21, 2021 eosinophils 100   ID Consult June 2023  # Sputum cx positive for M mucogenicum/phocaicum # COPD, Cough Variant Asthma # Multiple sclerosis with associated B/L Vocal Cord Paralysis   09/28/20 respiratory cx normal respiratory flora 10/26/20 AFB smear and cx negative, strep constellatus , candida albicans ( tx with 8 weeks of IV ceftriaxone) 07/19/21 AFB smear and cx M mucogenicum/phocaicum, candida albicans ( tx 7 days of Fluconazole)   Does not meet criteria for Pulmonary NTM infection and hence, no indication to treat  OV 11/04/2021  Subjective:  Patient ID: Kayla [name], female , DOB: [birth year] , age 51 y.o. , MRN: [id number] , ADDRESS: [place] [id number] PCP Janith Lima, MD Patient Care Team: Janith Lima, MD as PCP - General (Internal Medicine) Delphia Grates, MD (Neurology) Melida Quitter, MD (Otolaryngology) Rosiland Oz, MD as  Consulting Physician (Infectious Diseases)  This Provider for this visit: Treatment Team:  Attending Provider: Brand Males, MD    11/04/2021 -  Transfer of care from Dr Melvyn Novas to Dr Chase Caller Chief Complaint  Patient presents with   Follow-up    Pt states she is about the same since last visit. States she coughs all throughout the day and will cough up phlegm that ranges from white to yellow in color. States that she also has occasional wheezing.     HPI TEJUANA BITTER 72 y.o. - she has bilateral vocal cord paralysius Follows with DR [name]. DEcades ago has seen Dr Lorenza Cambridge at [facility] who recommended adipose injection but she did not enjoy the interaction. Cough been going on for many years. Ws on gbapntin for neuropathy for many years and stopped this year but this did not make any difference in cough. She has had GI eval . She has hx of potential aspiration. She has hx of PNA. LAst CT May 2023. She had PFT - she says they told her id c/w asthma but in my view  it is cw vocal cord paralysis. She has not had allergy testing. Feno is normal.  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   For her MS she is off therapy. -0  - follows with doc at [facility]   She says she is  immune suppresed and is on IvIG - Dr Deneen Harts [place] 05/05/2022  Subjective:  Patient ID: Kayla [name], female , DOB: 03/03/1951 , age 85 y.o. , MRN: [id number] , ADDRESS: [place] [id number] PCP Janith Lima, MD Patient Care Team: Janith Lima, MD as PCP - General (Internal Medicine) Melida Quitter, MD (Otolaryngology) Rosiland Oz, MD as Consulting Physician (Infectious Diseases) Dorice Lamas, MD (Neurology) Hinton Rao, Kathalene Frames, MD (Allergy and Immunology)  This Provider for this visit: Treatment Team:  Attending Provider: Brand Males, MD    05/05/2022 -   Chief Complaint  Patient presents with   Follow-up    Pt is  here for follow up for cough and COPD. Pt states that the cough is the same and that she is doing well. Pt is using trelelgy daily with albuterol as needed    Associated asthma and vocal cord paralysis.  HPI Kayla [name] 72 y.o. -presents for follow-up.  I last saw her in August 2023.  After that she saw a nurse practitioner.  I referred her to Dr. Dennison Mascot right because of chronic cough and bilateral vocal  cord/fold paralysis.  I reviewed Dr. Bettina Gavia notes.  His assessment was known bilateral vocal fold immobility with small trans glottal airway and troublesome cough.  He recommended conservative line of treatment.  She states with his advised she is 25% better in terms of cough shortness of breath and dysphagia.  She says she is now learned to accept the residual 75% of symptoms will still be there.  She still has some chronic fatigue.  But overall no new issues.  She did have CT scan of the chest in October 2023 and I reviewed this result.  There are some debris in the left lower lobe.  She is willing to have a follow-up CT.      Dr Lorenza Cambridge Reflux Symptom Index (> 13- associated asthma and vocal cord paralysis.  15 suggestive of LPR cough) 0 -> 5  =  none ->severe problem 11/04/2021   Hoarseness of problem with voice 3  Clearing  Of Throat 3  Excess throat mucus or feeling of post nasal drip 5  Difficulty swallowing food, liquid or tablets 5  Cough after eating or lying down 5  Breathing difficulties or choking episodes 5  Troublesome or annoying cough 5  Sensation of something sticking in throat or lump in throat 5  Heartburn, chest pain, indigestion, or stomach acid coming up -  TOTAL 36     FENO  Lab Results  Component Value Date   NITRICOXIDE 14 11/04/2021     PFT     Latest Ref Rng & Units 10/24/2021    3:48 PM  PFT Results  FVC-Pre L 2.17   FVC-Predicted Pre % 74   FVC-Post L 2.22   FVC-Predicted Post % 76   Pre FEV1/FVC % % 50   Post FEV1/FCV % % 71    FEV1-Pre L 1.09   FEV1-Predicted Pre % 49   FEV1-Post L 1.59   DLCO uncorrected ml/min/mmHg 11.43   DLCO UNC% % 60   DLCO corrected ml/min/mmHg 11.43   DLCO COR %Predicted % 60   DLVA Predicted % 83   TLC L 4.92   TLC % Predicted % 100   RV % Predicted % 135      Latest Reference Range & Units 11/04/21 16:38  Sheep Sorrel IgE kU/L <0.10  Pecan/Hickory Tree IgE kU/L <0.10  IgE (Immunoglobulin E), Serum <OR=114 kU/L 6  Allergen, D pternoyssinus,d7 kU/L <0.10  Cat Dander kU/L <0.10  Dog Dander kU/L <0.10  [place] Grass kU/L <0.10  Johnson Grass kU/L <0.10  Timothy Grass kU/L <0.10  Cockroach kU/L <0.10  Aspergillus fumigatus, m3 kU/L <0.10  Allergen, Comm Silver Wendee Copp, t9 kU/L <0.10  Allergen, Cottonwood, t14 kU/L <0.10  Elm IgE kU/L <0.10  Allergen, Mulberry, t76 kU/L <0.10  Allergen, Oak,t7 kU/L <0.10  COMMON RAGWEED (SHORT) (W1) IGE kU/L <0.10  Allergen, Mouse Urine Protein, e78 kU/L <0.10  D. farinae kU/L <0.10  Allergen, Cedar tree, t12 kU/L <0.10  Box Elder IgE kU/L <0.10  Rough Pigweed  IgE kU/L <0.10   CT chest May 2023   IMPRESSION: 1. Mild consolidation in the medial aspect of the right upper lobe and patchy infiltrates in the mid to lower lung fields on the left, concerning for pneumonia. Frothy debris is present in the trachea and left mainstem bronchus and aspiration pneumonia should be considered in the differential diagnosis. 2. Aortic atherosclerosis.     Electronically Signed   By: Brett Fairy M.D.   On: 07/30/2021 20:17  CT chest  Oct 2023  IMPRESSION: 1. Left lower lobe bronchial debris, mucoid impaction, peribronchovascular nodularity and mild architectural distortion, findings which may represent acute on chronic aspiration. An infectious bronchiolitis superimposed on postinfectious/postinflammatory scarring is not excluded. 2. Mild to moderate cylindrical bronchiectasis. 3. 1.8 cm low-attenuation left thyroid nodule. Previous  thyroid ultrasound 07/21/2017. No follow-up recommended unless clinically warranted. (Ref: J Am Coll Radiol. 2015 Feb;12(2): 143-50). 4.  Aortic atherosclerosis (ICD10-I70.0).     Electronically Signed   By: Lorin Picket M.D.   On: 01/22/2022 09:00   has a past medical history of Arthritis, Cervical cancer ([place]) (2005), COPD (chronic obstructive pulmonary disease) ([place]), Multiple sclerosis ([place]), Multiple sclerosis ([place]) (1996), Neuromuscular disorder ([place]), Osteoporosis, Other diseases of vocal cords, Other voice and resonance disorders, Pneumonia (2014, 1992), Sleep apnea, and Unspecified hypothyroidism.   reports that she has never smoked. She has never used smokeless tobacco.  Past Surgical History:  Procedure Laterality Date   ABDOMINAL HYSTERECTOMY  03/25/2003   for cervical cancer, complete   BRONCHIAL BIOPSY  10/26/2020   Procedure: BRONCHIAL BIOPSIES;  Surgeon: Garner Nash, DO;  Location: [facility];  Service: Pulmonary;;   BRONCHIAL BRUSHINGS  10/26/2020   Procedure: BRONCHIAL BRUSHINGS;  Surgeon: Garner Nash, DO;  Location: [facility];  Service: Pulmonary;;   BRONCHIAL WASHINGS  10/26/2020   Procedure: BRONCHIAL WASHINGS;  Surgeon: Garner Nash, DO;  Location: [facility];  Service: Pulmonary;;   CATARACT EXTRACTION, BILATERAL Bilateral    lens for cataracts   CONVERSION TO TOTAL HIP Right 09/18/2015   Procedure: CONVERSION OF RIGHT HEMI ARTHROPLASTY TO TOTAL HIP ARTHROPLASTY ACETABULAR REVISON ;  Surgeon: Paralee Cancel, MD;  Location: [facility];  Service: Orthopedics;  Laterality: Right;   HIP ARTHROPLASTY Right 03/01/2013   Procedure: ARTHROPLASTY BIPOLAR HIP;  Surgeon: Mauri Pole, MD;  Location: [facility];  Service: Orthopedics;  Laterality: Right;   HIP CLOSED REDUCTION Right 03/12/2013   Procedure: CLOSED REDUCTION HIP;  Surgeon: Gearlean Alf, MD;  Location: [facility];  Service: Orthopedics;  Laterality: Right;   HIP CLOSED REDUCTION Right 03/19/2013    Procedure: CLOSED REDUCTION HIP;  Surgeon: Johnn Hai, MD;  Location: [facility];  Service: Orthopedics;  Laterality: Right;   HIP CLOSED REDUCTION Right 11/10/2015   Procedure: CLOSED MANIPULATION HIP;  Surgeon: Rod Can, MD;  Location: [facility];  Service: Orthopedics;  Laterality: Right;   HIP CLOSED REDUCTION Right 06/05/2017   Procedure: CLOSED REDUCTION HIP;  Surgeon: [name], MD;  Location: [facility];  Service: Orthopedics;  Laterality: Right;   SMALL INTESTINE SURGERY     TOTAL HIP REVISION Right 06/06/2017   Procedure: TOTAL HIP REVISION;  Surgeon: [name], MD;  Location: [facility];  Service: Orthopedics;  Laterality: Right;   VESICOVAGINAL FISTULA CLOSURE W/ TAH  03/25/2003   VIDEO BRONCHOSCOPY N/A 10/26/2020   Procedure: VIDEO BRONCHOSCOPY WITH FLUORO;  Surgeon: Garner Nash, DO;  Location: [facility];  Service: Pulmonary;  Laterality: N/A;    Allergies  Allergen Reactions   Tramadol     hallucinations    Contrast Media [Iodinated Contrast Media] Rash   Iohexol Hives   Morphine And Related Other (See Comments)    Reaction:  Hallucinations    Methylprednisolone Other (See Comments)    Reaction:  Decreases pts heart rate    Hydrocodone Itching   Oxycodone-Acetaminophen Rash and Other (See Comments)    Reaction:  Hallucinations    Phenytoin Sodium Extended Rash   Zanaflex [Tizanidine Hydrochloride]  Rash    Immunization History  Administered Date(s) Administered   DTP 04/10/2009   Fluad Quad(high Dose 65+) 12/29/2018, 01/29/2022   Influenza Split 12/10/2010, 02/11/2012, 12/22/2012, 01/02/2014, 12/22/2014   Influenza Whole 02/19/2005, 03/28/2009, 01/23/2016   Influenza, High Dose Seasonal PF 02/23/2018   Influenza,inj,Quad PF,6+ Mos 02/10/2017   Influenza-Unspecified 12/02/2018, 01/08/2019, 12/22/2019, 01/15/2021, 02/05/2021   Moderna Covid-19 Vaccine Bivalent Booster 48yr & up 02/05/2021, 01/20/2022   Moderna Sars-Covid-2 Vaccination 04/29/2019,  06/01/2019, 01/19/2020, 08/14/2020   Pneumococcal Conjugate-13 11/06/2015   Pneumococcal Polysaccharide-23 03/28/2009, 04/26/2019   Respiratory Syncytial Virus Vaccine,Recomb Aduvanted(Arexvy) 12/26/2021   Tdap 04/26/2019   Zoster Recombinat (Shingrix) 09/07/2019, 02/17/2020    Family History  Problem Relation Age of Onset   Other Mother        COVID   Colon cancer Father    Atrial fibrillation Brother    Heart attack Maternal Grandfather    Parkinson's disease Paternal Grandfather    Esophageal cancer Neg Hx    Stomach cancer Neg Hx    Rectal cancer Neg Hx      Current Outpatient Medications:    albuterol (VENTOLIN HFA) 108 (90 Base) MCG/ACT inhaler, INHALE 2 PUFFS INTO LUNGS EVERY 6 HOURS AS NEEDED., Disp: 18 g, Rfl: 0   amphetamine-dextroamphetamine (ADDERALL) 20 MG tablet, Take 1 tablet (20 mg total) by mouth daily., Disp: 30 tablet, Rfl: 0   baclofen (LIORESAL) 20 MG tablet, Take 20 mg by mouth 3 (three) times daily as needed for muscle spasms., Disp: , Rfl:    CALCIUM PO, Take 1 tablet by mouth daily., Disp: , Rfl:    dexlansoprazole (DEXILANT) 60 MG capsule, TAKE 1 CAPSULE BY MOUTH  ONCE DAILY WITH BREAKFAST, Disp: 90 capsule, Rfl: 3   famotidine (PEPCID) 20 MG tablet, One after supper, Disp: 30 tablet, Rfl: 11   Ferric Maltol (ACCRUFER) 30 MG CAPS, Take 1 capsule by mouth in the morning and at bedtime., Disp: 180 capsule, Rfl: 0   FLUoxetine (PROZAC) 40 MG capsule, Take 1 capsule (40 mg total) by mouth daily., Disp: 90 capsule, Rfl: 1   furosemide (LASIX) 20 MG tablet, TAKE 1 TABLET BY MOUTH DAILY, Disp: 90 tablet, Rfl: 3   Immune Globulin, Human,-klhw (XEMBIFY [place]), Inject into the skin., Disp: , Rfl:    levothyroxine (SYNTHROID) 100 MCG tablet, TAKE 1 TABLET BY MOUTH ONCE  DAILY BEFORE BREAKFAST, Disp: 90 tablet, Rfl: 3   methenamine (HIPREX) 1 g tablet, Take 1 tablet (1 g total) by mouth 2 (two) times daily with a meal., Disp: 180 tablet, Rfl: 1   Multiple Vitamin  (MULTIVITAMIN WITH MINERALS) TABS tablet, Take 1 tablet by mouth daily., Disp: , Rfl:    pilocarpine (SALAGEN) 7.5 MG tablet, Take 7.5 mg by mouth 2 (two) times daily., Disp: , Rfl:    Probiotic Product (PROBIOTIC PO), Take 1 capsule by mouth daily. Harmony probiotic, Disp: , Rfl:    Respiratory Therapy Supplies (FLUTTER) DEVI, Use as directed, Disp: 1 each, Rfl: 0   sodium chloride HYPERTONIC 3 % nebulizer solution, Take by nebulization 2 (two) times daily as needed for cough., Disp: 750 mL, Rfl: 1   tamsulosin (FLOMAX) 0.4 MG CAPS capsule, Take 0.4 mg by mouth daily., Disp: , Rfl:    temazepam (RESTORIL) 15 MG capsule, Take 1 capsule (15 mg total) by mouth at bedtime., Disp: 30 capsule, Rfl: 1   TRELEGY ELLIPTA 100-62.5-25 MCG/ACT AEPB, Inhale 1 puff into the lungs daily., Disp: 120 each, Rfl: 1   trimethoprim (TRIMPEX)  100 MG tablet, Take 100 mg by mouth daily., Disp: , Rfl:       Objective:   Vitals:   05/05/22 1131  BP: 124/64  Pulse: 77  Temp: 98.7 F (37.1 C)  TempSrc: Oral  SpO2: 92%  Weight: 108 lb (49 kg)  Height: 5' 3"$  (1.6 m)    Estimated body mass index is 19.13 kg/m as calculated from the following:   Height as of this encounter: 5' 3"$  (1.6 m).   Weight as of this encounter: 108 lb (49 kg).  @WEIGHTCHANGE$ @  [employer]   05/05/22 1131  Weight: 108 lb (49 kg)     Physical Exam  General: No distress.  Lean deconditioned female.  She has a walker with her.  She has foot drop and it is connected to her stimulator. Neuro: Alert and Oriented x 3. GCS 15. Speech normal Psych: Pleasant Resp:  Barrel Chest - no.  Wheeze - no, Crackles - no, No overt respiratory distress CVS: Normal heart sounds. Murmurs - no Ext: Stigmata of Connective Tissue Disease - no HEENT: Normal upper airway. PEERL +. No post nasal drip        Assessment:       ICD-10-CM   1. Chronic cough  R05.3 CT Chest Wo Contrast    COVID-19, Flu A+B and RSV    2. DOE (dyspnea on exertion)   R06.09 CT Chest Wo Contrast    3. Cough variant asthma vs vcd  related to MS   J45.991 CT Chest Wo Contrast    4. Vocal cord paralysis  J38.00 CT Chest Wo Contrast    5. History of pneumonia  Z87.01 CT Chest Wo Contrast    6. Acute rhinitis  J00 COVID-19, Flu A+B and RSV         Plan:     Patient Instructions     ICD-10-CM   1. Chronic cough  R05.3     2. DOE (dyspnea on exertion)  R06.09     3. Cough variant asthma vs vcd  related to MS   J45.991     4. Vocal cord paralysis  J38.00     5. History of pneumonia  Z87.01        Chronic multi-year problem Glad 25% better after seeing Dr [name] at [facility] and following his advice Blood allergy test negative aug 2023 but glad trelegy is helping you CT chest in Oct 2023 with LLL bronchial debris  NEw acute runny nose  - sick contact from husband  Plan - repeat CT chest without contrast in 3-4 months as followup from CT in Oct 2023 - continue trelegy - check covid, flu, RSV NAA nasal swab 05/05/2022    Followup  - 3-4 months but after CT chest    SIGNATURE    Dr. Brand Males, M.D., F.C.C.P,  Pulmonary and Critical Care Medicine Staff Physician, [facility] Director - Interstitial Lung Disease  Program  Pulmonary [place] [facility] [place], [place], [address]  Pager: [phone], If no answer or between  15:00h - 7:00h: call [phone] Telephone: [phone]  12:56 PM 05/05/2022   Moderate Complexity MDM NEW OFFICE  The table below is from the 2021 E/M guidelines, first released in 2021, with minor revisions added in 2023. Must meet the requirements for 2 out of 3 dimensions to qualify.    Number and complexity of problems addressed Amount and/or complexity  of data reviewed Risk of complications and/or morbidity  One or more chronic illness with mild exacerbation, progression, or side effects of treatment  Two or more stable chronic  illnesses  One undiagnosed new problem with uncertain prognosis  One acute illness with systemic symptoms   Acute complicated injury Must meet the requirements for 1 of 3 of the categories)  Category 1: Tests and documents, historian  Any combination of 3 of the following:  Assessment requiring an independent historian  Review of prior external records  Review of results of each unique test  Ordering of each unique test    Category 2: Interpretation of tests  Independent interpretation of a test perfromed by another physician/NPP  Category 3: Discuss management/tests  Discussion of magagement or tests with an external physician/NPP Prescription drug management  Decision regarding minor surgery with identfied patient or procedure risk factors  Decision regarding elective major surgery without identified patient or procedure risk factors  Diagnosis or treatment significantly limited by social determinants of health

## 2022-05-05 ENCOUNTER — Ambulatory Visit (INDEPENDENT_AMBULATORY_CARE_PROVIDER_SITE_OTHER): Payer: Medicare Other | Admitting: Internal Medicine

## 2022-05-05 ENCOUNTER — Encounter: Payer: Self-pay | Admitting: Internal Medicine

## 2022-05-05 VITALS — BP 124/64 | HR 77 | Temp 98.7°F | Ht 63.0 in | Wt 108.0 lb

## 2022-05-05 DIAGNOSIS — J3800 Paralysis of vocal cords and larynx, unspecified: Secondary | ICD-10-CM | POA: Diagnosis not present

## 2022-05-05 DIAGNOSIS — J00 Acute nasopharyngitis [common cold]: Secondary | ICD-10-CM | POA: Diagnosis not present

## 2022-05-05 DIAGNOSIS — J45991 Cough variant asthma: Secondary | ICD-10-CM

## 2022-05-05 DIAGNOSIS — R053 Chronic cough: Secondary | ICD-10-CM | POA: Diagnosis not present

## 2022-05-05 DIAGNOSIS — Z8701 Personal history of pneumonia (recurrent): Secondary | ICD-10-CM | POA: Diagnosis not present

## 2022-05-05 DIAGNOSIS — R0609 Other forms of dyspnea: Secondary | ICD-10-CM

## 2022-05-07 ENCOUNTER — Encounter: Payer: Self-pay | Admitting: Internal Medicine

## 2022-05-07 LAB — COVID-19, FLU A+B AND RSV
Influenza A, NAA: NOT DETECTED
Influenza B, NAA: NOT DETECTED
RSV, NAA: NOT DETECTED
SARS-CoV-2, NAA: NOT DETECTED

## 2022-05-08 ENCOUNTER — Other Ambulatory Visit: Payer: Self-pay | Admitting: Internal Medicine

## 2022-05-08 DIAGNOSIS — R1314 Dysphagia, pharyngoesophageal phase: Secondary | ICD-10-CM

## 2022-05-08 DIAGNOSIS — K2100 Gastro-esophageal reflux disease with esophagitis, without bleeding: Secondary | ICD-10-CM

## 2022-05-08 MED ORDER — DEXLANSOPRAZOLE 60 MG PO CPDR: DELAYED_RELEASE_CAPSULE | ORAL | 1 refills | Status: DC

## 2022-05-09 DIAGNOSIS — R8279 Other abnormal findings on microbiological examination of urine: Secondary | ICD-10-CM | POA: Diagnosis not present

## 2022-05-09 DIAGNOSIS — N3946 Mixed incontinence: Secondary | ICD-10-CM | POA: Diagnosis not present

## 2022-05-09 DIAGNOSIS — N312 Flaccid neuropathic bladder, not elsewhere classified: Secondary | ICD-10-CM | POA: Diagnosis not present

## 2022-05-12 ENCOUNTER — Encounter: Payer: Self-pay | Admitting: Internal Medicine

## 2022-05-12 DIAGNOSIS — R1314 Dysphagia, pharyngoesophageal phase: Secondary | ICD-10-CM

## 2022-05-12 DIAGNOSIS — K2100 Gastro-esophageal reflux disease with esophagitis, without bleeding: Secondary | ICD-10-CM

## 2022-05-12 MED ORDER — DEXLANSOPRAZOLE 60 MG PO CPDR: DELAYED_RELEASE_CAPSULE | ORAL | 0 refills | Status: DC

## 2022-05-13 DIAGNOSIS — R260 Ataxic gait: Secondary | ICD-10-CM | POA: Diagnosis not present

## 2022-05-13 DIAGNOSIS — R296 Repeated falls: Secondary | ICD-10-CM | POA: Diagnosis not present

## 2022-05-13 DIAGNOSIS — S42211D Unspecified displaced fracture of surgical neck of right humerus, subsequent encounter for fracture with routine healing: Secondary | ICD-10-CM | POA: Diagnosis not present

## 2022-05-13 DIAGNOSIS — G35 Multiple sclerosis: Secondary | ICD-10-CM | POA: Diagnosis not present

## 2022-05-15 DIAGNOSIS — G35 Multiple sclerosis: Secondary | ICD-10-CM | POA: Diagnosis not present

## 2022-05-15 DIAGNOSIS — S42211D Unspecified displaced fracture of surgical neck of right humerus, subsequent encounter for fracture with routine healing: Secondary | ICD-10-CM | POA: Diagnosis not present

## 2022-05-15 DIAGNOSIS — R296 Repeated falls: Secondary | ICD-10-CM | POA: Diagnosis not present

## 2022-05-15 DIAGNOSIS — R260 Ataxic gait: Secondary | ICD-10-CM | POA: Diagnosis not present

## 2022-05-19 DIAGNOSIS — R296 Repeated falls: Secondary | ICD-10-CM | POA: Diagnosis not present

## 2022-05-19 DIAGNOSIS — G35 Multiple sclerosis: Secondary | ICD-10-CM | POA: Diagnosis not present

## 2022-05-19 DIAGNOSIS — S42211D Unspecified displaced fracture of surgical neck of right humerus, subsequent encounter for fracture with routine healing: Secondary | ICD-10-CM | POA: Diagnosis not present

## 2022-05-19 DIAGNOSIS — R260 Ataxic gait: Secondary | ICD-10-CM | POA: Diagnosis not present

## 2022-05-21 DIAGNOSIS — G35 Multiple sclerosis: Secondary | ICD-10-CM | POA: Diagnosis not present

## 2022-05-21 DIAGNOSIS — R260 Ataxic gait: Secondary | ICD-10-CM | POA: Diagnosis not present

## 2022-05-21 DIAGNOSIS — S42211D Unspecified displaced fracture of surgical neck of right humerus, subsequent encounter for fracture with routine healing: Secondary | ICD-10-CM | POA: Diagnosis not present

## 2022-05-21 DIAGNOSIS — R296 Repeated falls: Secondary | ICD-10-CM | POA: Diagnosis not present

## 2022-05-22 ENCOUNTER — Telehealth: Payer: Self-pay

## 2022-05-22 NOTE — Patient Outreach (Signed)
  Care Coordination   Initial Visit Note   05/22/2022 Name: Kayla [name] MRN: [id number] DOB: Nov 19, 1950  Kayla [name] is a 72 y.o. year old female who sees Janith Lima, MD for primary care. I spoke with  Kayla [name] by phone today.  What matters to the patients health and wellness today?  none    Goals Addressed             This Visit's Progress    COMPLETED: Care Coordination Activities-No follow up required       Interventions Today    Flowsheet Row Most Recent Value  Chronic Disease   Chronic disease during today's visit Chronic Obstructive Pulmonary Disease (COPD)  General Interventions   General Interventions Discussed/Reviewed General Interventions Discussed, Health Screening, Doctor Visits  Doctor Visits Discussed/Reviewed Doctor Visits Discussed, Annual Wellness Visits  Health Screening Colonoscopy, Mammogram  Education Interventions   Education Provided Provided Education  Provided Verbal Education On When to see the doctor  [place] Interventions   Pharmacy Dicussed/Reviewed Pharmacy Topics Discussed              SDOH assessments and interventions completed:  Yes  SDOH Interventions Today    Flowsheet Row Most Recent Value  SDOH Interventions   Food Insecurity Interventions Intervention Not Indicated  Housing Interventions Intervention Not Indicated  Transportation Interventions Intervention Not Indicated        Care Coordination Interventions:  Yes, provided   Follow up plan: No further intervention required.   Encounter Outcome:  Pt. Visit Completed   Jone Baseman, RN, MSN [facility] Care Management Coordinator Direct Line [phone]

## 2022-05-22 NOTE — Patient Instructions (Signed)
Visit Information  Thank you for taking time to visit with me today. Please don't hesitate to contact me if I can be of assistance to you.   Following are the goals we discussed today:   Goals Addressed             This Visit's Progress    COMPLETED: Care Coordination Activities-No follow up required       Interventions Today    Flowsheet Row Most Recent Value  Chronic Disease   Chronic disease during today's visit Chronic Obstructive Pulmonary Disease (COPD)  General Interventions   General Interventions Discussed/Reviewed General Interventions Discussed, Health Screening, Doctor Visits  Doctor Visits Discussed/Reviewed Doctor Visits Discussed, Annual Wellness Visits  Health Screening Colonoscopy, Mammogram  Education Interventions   Education Provided Provided Education  Provided Verbal Education On When to see the doctor  [place] Interventions   Pharmacy Dicussed/Reviewed Pharmacy Topics Discussed               If you are experiencing a Mental Health or [place] or need someone to talk to, please call the Suicide and Crisis Lifeline: [phone]   Patient verbalizes understanding of instructions and care plan provided today and agrees to view in [place]. Active MyChart status and patient understanding of how to access instructions and care plan via MyChart confirmed with patient.     No further follow up required: decline  Jone Baseman, RN, MSN [facility] [facility] Care Management Coordinator Direct Line [phone]

## 2022-05-23 ENCOUNTER — Other Ambulatory Visit: Payer: Self-pay | Admitting: Internal Medicine

## 2022-05-23 ENCOUNTER — Encounter: Payer: Self-pay | Admitting: Internal Medicine

## 2022-05-23 DIAGNOSIS — G35 Multiple sclerosis: Secondary | ICD-10-CM

## 2022-05-23 DIAGNOSIS — R5382 Chronic fatigue, unspecified: Secondary | ICD-10-CM

## 2022-05-24 ENCOUNTER — Other Ambulatory Visit (HOSPITAL_BASED_OUTPATIENT_CLINIC_OR_DEPARTMENT_OTHER): Payer: Self-pay

## 2022-05-24 MED ORDER — AMPHETAMINE-DEXTROAMPHETAMINE 20 MG PO TABS
20.0000 mg | ORAL_TABLET | Freq: Every day | ORAL | 0 refills | Status: DC
  Filled 2022-05-24: qty 30, 30d supply, fill #0

## 2022-05-26 ENCOUNTER — Ambulatory Visit (INDEPENDENT_AMBULATORY_CARE_PROVIDER_SITE_OTHER): Payer: Medicare Other | Admitting: Internal Medicine

## 2022-05-26 ENCOUNTER — Encounter: Payer: Self-pay | Admitting: Internal Medicine

## 2022-05-26 ENCOUNTER — Other Ambulatory Visit: Payer: Self-pay | Admitting: Internal Medicine

## 2022-05-26 VITALS — BP 134/68 | HR 82 | Temp 98.2°F | Resp 20 | Ht 63.0 in | Wt 106.0 lb

## 2022-05-26 DIAGNOSIS — G35 Multiple sclerosis: Secondary | ICD-10-CM | POA: Diagnosis not present

## 2022-05-26 DIAGNOSIS — B965 Pseudomonas (aeruginosa) (mallei) (pseudomallei) as the cause of diseases classified elsewhere: Secondary | ICD-10-CM | POA: Diagnosis not present

## 2022-05-26 DIAGNOSIS — E038 Other specified hypothyroidism: Secondary | ICD-10-CM | POA: Diagnosis not present

## 2022-05-26 DIAGNOSIS — D508 Other iron deficiency anemias: Secondary | ICD-10-CM

## 2022-05-26 DIAGNOSIS — J418 Mixed simple and mucopurulent chronic bronchitis: Secondary | ICD-10-CM

## 2022-05-26 DIAGNOSIS — N390 Urinary tract infection, site not specified: Secondary | ICD-10-CM

## 2022-05-26 DIAGNOSIS — R296 Repeated falls: Secondary | ICD-10-CM | POA: Diagnosis not present

## 2022-05-26 DIAGNOSIS — S42211D Unspecified displaced fracture of surgical neck of right humerus, subsequent encounter for fracture with routine healing: Secondary | ICD-10-CM | POA: Diagnosis not present

## 2022-05-26 DIAGNOSIS — K5904 Chronic idiopathic constipation: Secondary | ICD-10-CM | POA: Diagnosis not present

## 2022-05-26 DIAGNOSIS — N3000 Acute cystitis without hematuria: Secondary | ICD-10-CM

## 2022-05-26 DIAGNOSIS — J45991 Cough variant asthma: Secondary | ICD-10-CM | POA: Diagnosis not present

## 2022-05-26 DIAGNOSIS — E871 Hypo-osmolality and hyponatremia: Secondary | ICD-10-CM

## 2022-05-26 DIAGNOSIS — R260 Ataxic gait: Secondary | ICD-10-CM | POA: Diagnosis not present

## 2022-05-26 LAB — IBC + FERRITIN
Ferritin: 173 ng/mL (ref 10.0–291.0)
Iron: 84 ug/dL (ref 42–145)
Saturation Ratios: 30.9 % (ref 20.0–50.0)
TIBC: 271.6 ug/dL (ref 250.0–450.0)
Transferrin: 194 mg/dL — ABNORMAL LOW (ref 212.0–360.0)

## 2022-05-26 LAB — BASIC METABOLIC PANEL
BUN: 17 mg/dL (ref 6–23)
CO2: 30 mEq/L (ref 19–32)
Calcium: 9.7 mg/dL (ref 8.4–10.5)
Chloride: 95 mEq/L — ABNORMAL LOW (ref 96–112)
Creatinine, Ser: 0.91 mg/dL (ref 0.40–1.20)
GFR: 64.22 mL/min (ref >60.00)
Glucose, Bld: 91 mg/dL (ref 70–99)
Potassium: 4.3 mEq/L (ref 3.5–5.1)
Sodium: 134 mEq/L — ABNORMAL LOW (ref 135–145)

## 2022-05-26 LAB — URINALYSIS, ROUTINE W REFLEX MICROSCOPIC
Bilirubin Urine: NEGATIVE
Ketones, ur: NEGATIVE
Nitrite: NEGATIVE
Specific Gravity, Urine: 1.01 (ref 1.000–1.030)
Total Protein, Urine: NEGATIVE
Urine Glucose: NEGATIVE
Urobilinogen, UA: 0.2 (ref 0.0–1.0)
pH: 7 (ref 5.0–8.0)

## 2022-05-26 LAB — CBC WITH DIFFERENTIAL/PLATELET
Basophils Absolute: 0 10*3/uL (ref 0.0–0.1)
Basophils Relative: 1.2 % (ref 0.0–3.0)
Eosinophils Absolute: 0 10*3/uL (ref 0.0–0.7)
Eosinophils Relative: 1.2 % (ref 0.0–5.0)
HCT: 35.7 % — ABNORMAL LOW (ref 36.0–46.0)
Hemoglobin: 11.8 g/dL — ABNORMAL LOW (ref 12.0–15.0)
Lymphocytes Relative: 40.4 % (ref 12.0–46.0)
Lymphs Abs: 1.6 10*3/uL (ref 0.7–4.0)
MCHC: 33 g/dL (ref 30.0–36.0)
MCV: 87 fl (ref 78.0–100.0)
Monocytes Absolute: 0.4 10*3/uL (ref 0.1–1.0)
Monocytes Relative: 10.9 % (ref 3.0–12.0)
Neutro Abs: 1.9 10*3/uL (ref 1.4–7.7)
Neutrophils Relative %: 46.3 % (ref 43.0–77.0)
Platelets: 408 10*3/uL — ABNORMAL HIGH (ref 150.0–400.0)
RBC: 4.1 Mil/uL (ref 3.87–5.11)
RDW: 16.1 % — ABNORMAL HIGH (ref 11.5–15.5)
WBC: 4 10*3/uL (ref 4.0–10.5)

## 2022-05-26 LAB — TSH: TSH: 4.01 u[IU]/mL (ref 0.35–5.50)

## 2022-05-26 MED ORDER — TRELEGY ELLIPTA 100-62.5-25 MCG/ACT IN AEPB: 1.0000 | INHALATION_SPRAY | Freq: Every day | RESPIRATORY_TRACT | 1 refills | Status: DC

## 2022-05-26 NOTE — Patient Instructions (Signed)

## 2022-05-26 NOTE — Progress Notes (Unsigned)
Subjective:  Patient ID: Kayla [name], female    DOB: 03/08/1951  Age: 72 y.o. MRN: [id number]  CC: Cough, Anemia, and Urinary Tract Infection   HPI Kayla [name] presents for f/up ----  She was recently treated for UTI by a urologist with Keflex.  She continues to complain of dysuria and urgency.  Her cough has improved and is nonproductive.  Outpatient Medications Prior to Visit  Medication Sig Dispense Refill   albuterol (VENTOLIN HFA) 108 (90 Base) MCG/ACT inhaler INHALE 2 PUFFS INTO LUNGS EVERY 6 HOURS AS NEEDED. 18 g 0   amphetamine-dextroamphetamine (ADDERALL) 20 MG tablet Take 1 tablet (20 mg total) by mouth daily. 30 tablet 0   baclofen (LIORESAL) 20 MG tablet Take 20 mg by mouth 3 (three) times daily as needed for muscle spasms.     CALCIUM PO Take 1 tablet by mouth daily.     dexlansoprazole (DEXILANT) 60 MG capsule TAKE 1 CAPSULE BY MOUTH  ONCE DAILY WITH BREAKFAST 90 capsule 0   famotidine (PEPCID) 20 MG tablet One after supper 30 tablet 11   Ferric Maltol (ACCRUFER) 30 MG CAPS Take 1 capsule by mouth in the morning and at bedtime. 180 capsule 0   FLUoxetine (PROZAC) 40 MG capsule Take 1 capsule (40 mg total) by mouth daily. 90 capsule 1   furosemide (LASIX) 20 MG tablet TAKE 1 TABLET BY MOUTH DAILY 90 tablet 3   Immune Globulin, Human,-klhw (XEMBIFY [place]) Inject into the skin.     levothyroxine (SYNTHROID) 100 MCG tablet TAKE 1 TABLET BY MOUTH ONCE  DAILY BEFORE BREAKFAST 90 tablet 3   methenamine (HIPREX) 1 g tablet Take 1 tablet (1 g total) by mouth 2 (two) times daily with a meal. 180 tablet 1   Multiple Vitamin (MULTIVITAMIN WITH MINERALS) TABS tablet Take 1 tablet by mouth daily.     pilocarpine (SALAGEN) 7.5 MG tablet Take 7.5 mg by mouth 2 (two) times daily.     Probiotic Product (PROBIOTIC PO) Take 1 capsule by mouth daily. Harmony probiotic     Respiratory Therapy Supplies (FLUTTER) DEVI Use as directed 1 each 0   sodium chloride HYPERTONIC 3 % nebulizer  solution Take by nebulization 2 (two) times daily as needed for cough. 750 mL 1   tamsulosin (FLOMAX) 0.4 MG CAPS capsule Take 0.4 mg by mouth daily.     temazepam (RESTORIL) 15 MG capsule Take 1 capsule (15 mg total) by mouth at bedtime. 30 capsule 1   trimethoprim (TRIMPEX) 100 MG tablet Take 100 mg by mouth daily.     Vibegron (GEMTESA) 75 MG TABS Take by mouth.     TRELEGY ELLIPTA 100-62.5-25 MCG/ACT AEPB Inhale 1 puff into the lungs daily. 120 each 1   No facility-administered medications prior to visit.    ROS Review of Systems  Constitutional:  Positive for fatigue. Negative for chills, diaphoresis and fever.  HENT: Negative.    Eyes: Negative.   Respiratory:  Positive for cough, choking and shortness of breath. Negative for chest tightness and wheezing.   Cardiovascular:  Negative for chest pain, palpitations and leg swelling.  Gastrointestinal:  Positive for constipation. Negative for abdominal pain, diarrhea and nausea.  Genitourinary:  Positive for dysuria and urgency. Negative for decreased urine volume, difficulty urinating, frequency and hematuria.  Musculoskeletal:  Positive for gait problem.  Skin: Negative.   Neurological:  Negative for dizziness, weakness and light-headedness.  Hematological:  Negative for adenopathy. Does not bruise/bleed easily.  Psychiatric/Behavioral: Negative.  Objective:  BP 134/68 (BP Location: Left Arm, Patient Position: Sitting, Cuff Size: Large)   Pulse 82   Temp 98.2 F (36.8 C) (Oral)   Resp 20   Ht '5\' 3"'$  (1.6 m)   Wt 106 lb (48.1 kg)   SpO2 92%   BMI 18.78 kg/m   BP Readings from Last 3 Encounters:  05/26/22 134/68  05/05/22 124/64  02/26/22 130/80    Wt Readings from Last 3 Encounters:  05/26/22 106 lb (48.1 kg)  05/05/22 108 lb (49 kg)  02/26/22 109 lb (49.4 kg)    Physical Exam Vitals reviewed.  Constitutional:      General: She is not in acute distress.    Appearance: She is ill-appearing. She is not  toxic-appearing or diaphoretic.  HENT:     Nose: Nose normal.     Mouth/Throat:     Mouth: Mucous membranes are moist.  Eyes:     General: No scleral icterus.    Conjunctiva/sclera: Conjunctivae normal.  Cardiovascular:     Rate and Rhythm: Normal rate and regular rhythm.     Heart sounds: No murmur heard. Pulmonary:     Effort: Pulmonary effort is normal.     Breath sounds: No stridor. No wheezing, rhonchi or rales.  Musculoskeletal:        General: Normal range of motion.     Cervical back: Neck supple.     Right lower leg: No edema.     Left lower leg: No edema.  Lymphadenopathy:     Cervical: No cervical adenopathy.  Skin:    General: Skin is warm and dry.     Coloration: Skin is not pale.  Neurological:     General: No focal deficit present.     Lab Results  Component Value Date   WBC 4.0 05/26/2022   HGB 11.8 (L) 05/26/2022   HCT 35.7 (L) 05/26/2022   PLT 408.0 (H) 05/26/2022   GLUCOSE 91 05/26/2022   CHOL 169 01/14/2022   TRIG 157.0 (H) 01/14/2022   HDL 41.50 01/14/2022   LDLDIRECT 116.1 06/25/2010   LDLCALC 96 01/14/2022   ALT 22 01/14/2022   AST 30 01/14/2022   NA 134 (L) 05/26/2022   K 4.3 05/26/2022   CL 95 (L) 05/26/2022   CREATININE 0.91 05/26/2022   BUN 17 05/26/2022   CO2 30 05/26/2022   TSH 4.01 05/26/2022   INR 1.0 10/25/2020   HGBA1C 5.6 02/25/2013    CT Chest Wo Contrast  Result Date: 01/22/2022 CLINICAL DATA:  Persistent cough. COPD. History of cervical cancer. * Tracking Code: BO * EXAM: CT CHEST WITHOUT CONTRAST TECHNIQUE: Multidetector CT imaging of the chest was performed following the standard protocol without IV contrast. RADIATION DOSE REDUCTION: This exam was performed according to the departmental dose-optimization program which includes automated exposure control, adjustment of the mA and/or kV according to patient size and/or use of iterative reconstruction technique. COMPARISON:  07/29/2021. FINDINGS: Cardiovascular:  Atherosclerotic calcification of the aorta. Heart size normal. Small amount of pericardial fluid may be physiologic. Mediastinum/Nodes: 1.8 cm low-attenuation lesion in the inferior left thyroid. No pathologically enlarged mediastinal or axillary lymph nodes. Calcified bihilar lymph nodes. Hilar regions are otherwise difficult to definitively evaluate without IV contrast. Esophagus is mildly dilated and air-filled throughout its course, which can be seen with dysmotility. Lungs/Pleura: Biapical pleuroparenchymal scarring. Mild to moderate cylindrical bronchiectasis. Scattered pulmonary parenchymal scarring. Bronchial debris and mucoid impaction in the left lower lobe with peribronchovascular nodularity and mild architectural distortion.  No pleural fluid. Airway is unremarkable. Upper Abdomen: Low-attenuation lesions in the liver measure up to 12 mm and are likely cysts. Visualized portions of the liver, adrenal glands, kidneys, spleen, pancreas, stomach and bowel are otherwise unremarkable. Musculoskeletal: Degenerative changes in the spine. No worrisome lytic or sclerotic lesions. IMPRESSION: 1. Left lower lobe bronchial debris, mucoid impaction, peribronchovascular nodularity and mild architectural distortion, findings which may represent acute on chronic aspiration. An infectious bronchiolitis superimposed on postinfectious/postinflammatory scarring is not excluded. 2. Mild to moderate cylindrical bronchiectasis. 3. 1.8 cm low-attenuation left thyroid nodule. Previous thyroid ultrasound 07/21/2017. No follow-up recommended unless clinically warranted. (Ref: J Am Coll Radiol. 2015 Feb;12(2): 143-50). 4.  Aortic atherosclerosis (ICD10-I70.0). Electronically Signed   By: Lorin Picket M.D.   On: 01/22/2022 09:00    Assessment & Plan:   Kayla [name] was seen today for cough, anemia and urinary tract infection.  Diagnoses and all orders for this visit:  Hypothyroidism- She is euthyroid. -     CBC with  Differential/Platelet; Future -     TSH; Future -     TSH -     CBC with Differential/Platelet  Cough variant asthma vs vcd  related to MS  -     Fluticasone-Umeclidin-Vilant (TRELEGY ELLIPTA) 100-62.5-25 MCG/ACT AEPB; Inhale 1 puff into the lungs daily.  Mixed simple and mucopurulent chronic bronchitis ([place]) -     Fluticasone-Umeclidin-Vilant (TRELEGY ELLIPTA) 100-62.5-25 MCG/ACT AEPB; Inhale 1 puff into the lungs daily.  Hyponatremia -     Basic metabolic panel; Future -     Basic metabolic panel  Chronic idiopathic constipation -     Basic metabolic panel; Future -     TSH; Future -     TSH -     Basic metabolic panel  Iron deficiency anemia secondary to inadequate dietary iron intake -     CBC with Differential/Platelet; Future -     IBC + Ferritin; Future -     IBC + Ferritin -     CBC with Differential/Platelet  Recurrent UTI (urinary tract infection) -     Urinalysis, Routine w reflex microscopic; Future -     CULTURE, URINE COMPREHENSIVE; Future -     CULTURE, URINE COMPREHENSIVE -     Urinalysis, Routine w reflex microscopic  Acute cystitis without hematuria -     Urinalysis, Routine w reflex microscopic; Future -     CULTURE, URINE COMPREHENSIVE; Future -     CULTURE, URINE COMPREHENSIVE -     Urinalysis, Routine w reflex microscopic -     ciprofloxacin (CIPRO) 250 MG tablet; Take 1 tablet (250 mg total) by mouth 2 (two) times daily for 5 days.  Pseudomonas urinary tract infection -     ciprofloxacin (CIPRO) 250 MG tablet; Take 1 tablet (250 mg total) by mouth 2 (two) times daily for 5 days.   I have changed Breklyn H. Schwarting's Trelegy Ellipta. I am also having her start on ciprofloxacin. Additionally, I am having her maintain her baclofen, multivitamin with minerals, Flutter, famotidine, tamsulosin, pilocarpine, Probiotic Product (PROBIOTIC PO), CALCIUM PO, albuterol, (Immune Globulin, Human,-klhw (XEMBIFY [place])), trimethoprim, levothyroxine, methenamine, sodium  chloride HYPERTONIC, ACCRUFeR, FLUoxetine, furosemide, temazepam, dexlansoprazole, amphetamine-dextroamphetamine, and Gemtesa.  Meds ordered this encounter  Medications   Fluticasone-Umeclidin-Vilant (TRELEGY ELLIPTA) 100-62.5-25 MCG/ACT AEPB    Sig: Inhale 1 puff into the lungs daily.    Dispense:  120 each    Refill:  1   ciprofloxacin (CIPRO) 250 MG tablet    Sig:  Take 1 tablet (250 mg total) by mouth 2 (two) times daily for 5 days.    Dispense:  10 tablet    Refill:  0     Follow-up: Return in about 4 months (around 09/25/2022).  Scarlette Calico, MD

## 2022-05-27 DIAGNOSIS — G35 Multiple sclerosis: Secondary | ICD-10-CM | POA: Diagnosis not present

## 2022-05-27 DIAGNOSIS — D849 Immunodeficiency, unspecified: Secondary | ICD-10-CM | POA: Diagnosis not present

## 2022-05-27 DIAGNOSIS — J449 Chronic obstructive pulmonary disease, unspecified: Secondary | ICD-10-CM | POA: Diagnosis not present

## 2022-05-28 DIAGNOSIS — G35 Multiple sclerosis: Secondary | ICD-10-CM | POA: Diagnosis not present

## 2022-05-28 DIAGNOSIS — B965 Pseudomonas (aeruginosa) (mallei) (pseudomallei) as the cause of diseases classified elsewhere: Secondary | ICD-10-CM | POA: Insufficient documentation

## 2022-05-28 DIAGNOSIS — N390 Urinary tract infection, site not specified: Secondary | ICD-10-CM | POA: Insufficient documentation

## 2022-05-28 DIAGNOSIS — S42211D Unspecified displaced fracture of surgical neck of right humerus, subsequent encounter for fracture with routine healing: Secondary | ICD-10-CM | POA: Diagnosis not present

## 2022-05-28 DIAGNOSIS — R296 Repeated falls: Secondary | ICD-10-CM | POA: Diagnosis not present

## 2022-05-28 DIAGNOSIS — R260 Ataxic gait: Secondary | ICD-10-CM | POA: Diagnosis not present

## 2022-05-28 LAB — CULTURE, URINE COMPREHENSIVE

## 2022-05-28 MED ORDER — CIPROFLOXACIN HCL 250 MG PO TABS: 250.0000 mg | ORAL_TABLET | Freq: Two times a day (BID) | ORAL | 0 refills | Status: AC

## 2022-06-02 DIAGNOSIS — G35 Multiple sclerosis: Secondary | ICD-10-CM | POA: Diagnosis not present

## 2022-06-02 DIAGNOSIS — R260 Ataxic gait: Secondary | ICD-10-CM | POA: Diagnosis not present

## 2022-06-02 DIAGNOSIS — R296 Repeated falls: Secondary | ICD-10-CM | POA: Diagnosis not present

## 2022-06-02 DIAGNOSIS — S42211D Unspecified displaced fracture of surgical neck of right humerus, subsequent encounter for fracture with routine healing: Secondary | ICD-10-CM | POA: Diagnosis not present

## 2022-06-04 DIAGNOSIS — S42211D Unspecified displaced fracture of surgical neck of right humerus, subsequent encounter for fracture with routine healing: Secondary | ICD-10-CM | POA: Diagnosis not present

## 2022-06-04 DIAGNOSIS — R296 Repeated falls: Secondary | ICD-10-CM | POA: Diagnosis not present

## 2022-06-04 DIAGNOSIS — G35 Multiple sclerosis: Secondary | ICD-10-CM | POA: Diagnosis not present

## 2022-06-04 DIAGNOSIS — R260 Ataxic gait: Secondary | ICD-10-CM | POA: Diagnosis not present

## 2022-06-09 DIAGNOSIS — R296 Repeated falls: Secondary | ICD-10-CM | POA: Diagnosis not present

## 2022-06-09 DIAGNOSIS — G35 Multiple sclerosis: Secondary | ICD-10-CM | POA: Diagnosis not present

## 2022-06-09 DIAGNOSIS — R260 Ataxic gait: Secondary | ICD-10-CM | POA: Diagnosis not present

## 2022-06-09 DIAGNOSIS — S42211D Unspecified displaced fracture of surgical neck of right humerus, subsequent encounter for fracture with routine healing: Secondary | ICD-10-CM | POA: Diagnosis not present

## 2022-06-11 DIAGNOSIS — R296 Repeated falls: Secondary | ICD-10-CM | POA: Diagnosis not present

## 2022-06-11 DIAGNOSIS — G35 Multiple sclerosis: Secondary | ICD-10-CM | POA: Diagnosis not present

## 2022-06-11 DIAGNOSIS — R260 Ataxic gait: Secondary | ICD-10-CM | POA: Diagnosis not present

## 2022-06-11 DIAGNOSIS — S42211D Unspecified displaced fracture of surgical neck of right humerus, subsequent encounter for fracture with routine healing: Secondary | ICD-10-CM | POA: Diagnosis not present

## 2022-06-16 DIAGNOSIS — R260 Ataxic gait: Secondary | ICD-10-CM | POA: Diagnosis not present

## 2022-06-16 DIAGNOSIS — G35 Multiple sclerosis: Secondary | ICD-10-CM | POA: Diagnosis not present

## 2022-06-16 DIAGNOSIS — R296 Repeated falls: Secondary | ICD-10-CM | POA: Diagnosis not present

## 2022-06-16 DIAGNOSIS — S42211D Unspecified displaced fracture of surgical neck of right humerus, subsequent encounter for fracture with routine healing: Secondary | ICD-10-CM | POA: Diagnosis not present

## 2022-06-18 DIAGNOSIS — S42211D Unspecified displaced fracture of surgical neck of right humerus, subsequent encounter for fracture with routine healing: Secondary | ICD-10-CM | POA: Diagnosis not present

## 2022-06-18 DIAGNOSIS — R260 Ataxic gait: Secondary | ICD-10-CM | POA: Diagnosis not present

## 2022-06-18 DIAGNOSIS — R296 Repeated falls: Secondary | ICD-10-CM | POA: Diagnosis not present

## 2022-06-18 DIAGNOSIS — G35 Multiple sclerosis: Secondary | ICD-10-CM | POA: Diagnosis not present

## 2022-06-19 ENCOUNTER — Encounter (HOSPITAL_COMMUNITY): Payer: Self-pay | Admitting: Emergency Medicine

## 2022-06-19 ENCOUNTER — Other Ambulatory Visit: Payer: Self-pay

## 2022-06-19 ENCOUNTER — Emergency Department (HOSPITAL_COMMUNITY): Payer: Medicare Other

## 2022-06-19 ENCOUNTER — Inpatient Hospital Stay (HOSPITAL_COMMUNITY)
Admission: EM | Admit: 2022-06-19 | Discharge: 2022-06-22 | DRG: 535 | Disposition: A | Discharge status: Rehabilitation Facility | Payer: Medicare Other | Attending: Internal Medicine | Admitting: Internal Medicine

## 2022-06-19 DIAGNOSIS — S32592A Other specified fracture of left pubis, initial encounter for closed fracture: Secondary | ICD-10-CM | POA: Diagnosis not present

## 2022-06-19 DIAGNOSIS — D508 Other iron deficiency anemias: Secondary | ICD-10-CM | POA: Diagnosis present

## 2022-06-19 DIAGNOSIS — N390 Urinary tract infection, site not specified: Secondary | ICD-10-CM | POA: Diagnosis present

## 2022-06-19 DIAGNOSIS — S0990XA Unspecified injury of head, initial encounter: Secondary | ICD-10-CM | POA: Diagnosis not present

## 2022-06-19 DIAGNOSIS — Z8701 Personal history of pneumonia (recurrent): Secondary | ICD-10-CM

## 2022-06-19 DIAGNOSIS — E039 Hypothyroidism, unspecified: Secondary | ICD-10-CM | POA: Diagnosis present

## 2022-06-19 DIAGNOSIS — Z820 Family history of epilepsy and other diseases of the nervous system: Secondary | ICD-10-CM | POA: Diagnosis not present

## 2022-06-19 DIAGNOSIS — F909 Attention-deficit hyperactivity disorder, unspecified type: Secondary | ICD-10-CM | POA: Diagnosis present

## 2022-06-19 DIAGNOSIS — S32491A Other specified fracture of right acetabulum, initial encounter for closed fracture: Secondary | ICD-10-CM | POA: Diagnosis present

## 2022-06-19 DIAGNOSIS — Z96641 Presence of right artificial hip joint: Secondary | ICD-10-CM | POA: Diagnosis present

## 2022-06-19 DIAGNOSIS — W19XXXA Unspecified fall, initial encounter: Secondary | ICD-10-CM | POA: Diagnosis not present

## 2022-06-19 DIAGNOSIS — Z800 Family history of malignant neoplasm of digestive organs: Secondary | ICD-10-CM | POA: Diagnosis not present

## 2022-06-19 DIAGNOSIS — K219 Gastro-esophageal reflux disease without esophagitis: Secondary | ICD-10-CM

## 2022-06-19 DIAGNOSIS — M1990 Unspecified osteoarthritis, unspecified site: Secondary | ICD-10-CM | POA: Diagnosis present

## 2022-06-19 DIAGNOSIS — S32511A Fracture of superior rim of right pubis, initial encounter for closed fracture: Secondary | ICD-10-CM | POA: Diagnosis not present

## 2022-06-19 DIAGNOSIS — K5900 Constipation, unspecified: Secondary | ICD-10-CM | POA: Diagnosis not present

## 2022-06-19 DIAGNOSIS — G4730 Sleep apnea, unspecified: Secondary | ICD-10-CM | POA: Diagnosis present

## 2022-06-19 DIAGNOSIS — K5909 Other constipation: Secondary | ICD-10-CM | POA: Diagnosis present

## 2022-06-19 DIAGNOSIS — M810 Age-related osteoporosis without current pathological fracture: Secondary | ICD-10-CM | POA: Diagnosis present

## 2022-06-19 DIAGNOSIS — Z90710 Acquired absence of both cervix and uterus: Secondary | ICD-10-CM | POA: Diagnosis not present

## 2022-06-19 DIAGNOSIS — E876 Hypokalemia: Secondary | ICD-10-CM | POA: Diagnosis present

## 2022-06-19 DIAGNOSIS — S32501A Unspecified fracture of right pubis, initial encounter for closed fracture: Secondary | ICD-10-CM | POA: Diagnosis not present

## 2022-06-19 DIAGNOSIS — G4700 Insomnia, unspecified: Secondary | ICD-10-CM | POA: Diagnosis present

## 2022-06-19 DIAGNOSIS — S32591A Other specified fracture of right pubis, initial encounter for closed fracture: Secondary | ICD-10-CM | POA: Diagnosis not present

## 2022-06-19 DIAGNOSIS — R498 Other voice and resonance disorders: Secondary | ICD-10-CM | POA: Diagnosis present

## 2022-06-19 DIAGNOSIS — J4489 Other specified chronic obstructive pulmonary disease: Secondary | ICD-10-CM | POA: Diagnosis present

## 2022-06-19 DIAGNOSIS — G35 Multiple sclerosis: Secondary | ICD-10-CM | POA: Diagnosis not present

## 2022-06-19 DIAGNOSIS — S32401A Unspecified fracture of right acetabulum, initial encounter for closed fracture: Secondary | ICD-10-CM

## 2022-06-19 DIAGNOSIS — S32409D Unspecified fracture of unspecified acetabulum, subsequent encounter for fracture with routine healing: Secondary | ICD-10-CM | POA: Diagnosis not present

## 2022-06-19 DIAGNOSIS — S32592D Other specified fracture of left pubis, subsequent encounter for fracture with routine healing: Secondary | ICD-10-CM | POA: Diagnosis not present

## 2022-06-19 DIAGNOSIS — Z8249 Family history of ischemic heart disease and other diseases of the circulatory system: Secondary | ICD-10-CM | POA: Diagnosis not present

## 2022-06-19 DIAGNOSIS — E038 Other specified hypothyroidism: Secondary | ICD-10-CM | POA: Diagnosis not present

## 2022-06-19 DIAGNOSIS — J45991 Cough variant asthma: Secondary | ICD-10-CM | POA: Diagnosis not present

## 2022-06-19 DIAGNOSIS — Z87440 Personal history of urinary (tract) infections: Secondary | ICD-10-CM | POA: Diagnosis not present

## 2022-06-19 DIAGNOSIS — S32409A Unspecified fracture of unspecified acetabulum, initial encounter for closed fracture: Secondary | ICD-10-CM | POA: Diagnosis present

## 2022-06-19 DIAGNOSIS — J383 Other diseases of vocal cords: Secondary | ICD-10-CM | POA: Diagnosis present

## 2022-06-19 DIAGNOSIS — F32A Depression, unspecified: Secondary | ICD-10-CM | POA: Diagnosis present

## 2022-06-19 DIAGNOSIS — M25572 Pain in left ankle and joints of left foot: Secondary | ICD-10-CM | POA: Diagnosis not present

## 2022-06-19 DIAGNOSIS — J440 Chronic obstructive pulmonary disease with acute lower respiratory infection: Secondary | ICD-10-CM | POA: Diagnosis not present

## 2022-06-19 DIAGNOSIS — Z885 Allergy status to narcotic agent status: Secondary | ICD-10-CM

## 2022-06-19 DIAGNOSIS — J189 Pneumonia, unspecified organism: Secondary | ICD-10-CM | POA: Diagnosis not present

## 2022-06-19 DIAGNOSIS — R102 Pelvic and perineal pain: Secondary | ICD-10-CM | POA: Diagnosis not present

## 2022-06-19 DIAGNOSIS — E871 Hypo-osmolality and hyponatremia: Secondary | ICD-10-CM | POA: Diagnosis present

## 2022-06-19 DIAGNOSIS — S0101XA Laceration without foreign body of scalp, initial encounter: Secondary | ICD-10-CM | POA: Diagnosis not present

## 2022-06-19 DIAGNOSIS — Y92009 Unspecified place in unspecified non-institutional (private) residence as the place of occurrence of the external cause: Secondary | ICD-10-CM

## 2022-06-19 DIAGNOSIS — Z23 Encounter for immunization: Secondary | ICD-10-CM

## 2022-06-19 DIAGNOSIS — Z888 Allergy status to other drugs, medicaments and biological substances status: Secondary | ICD-10-CM

## 2022-06-19 DIAGNOSIS — Z79890 Hormone replacement therapy (postmenopausal): Secondary | ICD-10-CM | POA: Diagnosis not present

## 2022-06-19 DIAGNOSIS — R58 Hemorrhage, not elsewhere classified: Secondary | ICD-10-CM | POA: Diagnosis not present

## 2022-06-19 DIAGNOSIS — W1830XD Fall on same level, unspecified, subsequent encounter: Secondary | ICD-10-CM | POA: Diagnosis not present

## 2022-06-19 DIAGNOSIS — J9811 Atelectasis: Secondary | ICD-10-CM | POA: Diagnosis present

## 2022-06-19 DIAGNOSIS — S32401D Unspecified fracture of right acetabulum, subsequent encounter for fracture with routine healing: Secondary | ICD-10-CM | POA: Diagnosis present

## 2022-06-19 DIAGNOSIS — D509 Iron deficiency anemia, unspecified: Secondary | ICD-10-CM | POA: Diagnosis present

## 2022-06-19 DIAGNOSIS — R338 Other retention of urine: Secondary | ICD-10-CM | POA: Diagnosis present

## 2022-06-19 DIAGNOSIS — Z7951 Long term (current) use of inhaled steroids: Secondary | ICD-10-CM

## 2022-06-19 DIAGNOSIS — S32591D Other specified fracture of right pubis, subsequent encounter for fracture with routine healing: Secondary | ICD-10-CM | POA: Diagnosis not present

## 2022-06-19 DIAGNOSIS — Z91041 Radiographic dye allergy status: Secondary | ICD-10-CM

## 2022-06-19 DIAGNOSIS — M25551 Pain in right hip: Secondary | ICD-10-CM | POA: Diagnosis not present

## 2022-06-19 DIAGNOSIS — Z79899 Other long term (current) drug therapy: Secondary | ICD-10-CM

## 2022-06-19 DIAGNOSIS — Z043 Encounter for examination and observation following other accident: Secondary | ICD-10-CM | POA: Diagnosis not present

## 2022-06-19 DIAGNOSIS — S329XXA Fracture of unspecified parts of lumbosacral spine and pelvis, initial encounter for closed fracture: Secondary | ICD-10-CM | POA: Diagnosis not present

## 2022-06-19 DIAGNOSIS — R9431 Abnormal electrocardiogram [ECG] [EKG]: Secondary | ICD-10-CM | POA: Diagnosis not present

## 2022-06-19 DIAGNOSIS — W01198A Fall on same level from slipping, tripping and stumbling with subsequent striking against other object, initial encounter: Secondary | ICD-10-CM | POA: Diagnosis present

## 2022-06-19 DIAGNOSIS — Z8541 Personal history of malignant neoplasm of cervix uteri: Secondary | ICD-10-CM | POA: Diagnosis not present

## 2022-06-19 DIAGNOSIS — T84020A Dislocation of internal right hip prosthesis, initial encounter: Secondary | ICD-10-CM | POA: Diagnosis present

## 2022-06-19 LAB — CBC WITH DIFFERENTIAL/PLATELET
Abs Immature Granulocytes: 0.09 10*3/uL — ABNORMAL HIGH (ref 0.00–0.07)
Basophils Absolute: 0.1 10*3/uL (ref 0.0–0.1)
Basophils Relative: 1 %
Eosinophils Absolute: 0 10*3/uL (ref 0.0–0.5)
Eosinophils Relative: 0 %
HCT: 34.7 % — ABNORMAL LOW (ref 36.0–46.0)
Hemoglobin: 11 g/dL — ABNORMAL LOW (ref 12.0–15.0)
Immature Granulocytes: 1 %
Lymphocytes Relative: 13 %
Lymphs Abs: 1.2 10*3/uL (ref 0.7–4.0)
MCH: 29 pg (ref 26.0–34.0)
MCHC: 31.7 g/dL (ref 30.0–36.0)
MCV: 91.6 fL (ref 80.0–100.0)
Monocytes Absolute: 0.6 10*3/uL (ref 0.1–1.0)
Monocytes Relative: 6 %
Neutro Abs: 7.2 10*3/uL (ref 1.7–7.7)
Neutrophils Relative %: 79 %
Platelets: 268 10*3/uL (ref 150–400)
RBC: 3.79 MIL/uL — ABNORMAL LOW (ref 3.87–5.11)
RDW: 15.7 % — ABNORMAL HIGH (ref 11.5–15.5)
WBC: 9.1 10*3/uL (ref 4.0–10.5)
nRBC: 0 % (ref 0.0–0.2)

## 2022-06-19 LAB — BASIC METABOLIC PANEL
Anion gap: 5 (ref 5–15)
BUN: 18 mg/dL (ref 8–23)
CO2: 22 mmol/L (ref 22–32)
Calcium: 6.8 mg/dL — ABNORMAL LOW (ref 8.9–10.3)
Chloride: 109 mmol/L (ref 98–111)
Creatinine, Ser: 0.61 mg/dL (ref 0.44–1.00)
GFR, Estimated: >60 mL/min (ref >60)
Glucose, Bld: 85 mg/dL (ref 70–99)
Potassium: 3.2 mmol/L — ABNORMAL LOW (ref 3.5–5.1)
Sodium: 136 mmol/L (ref 135–145)

## 2022-06-19 LAB — CBG MONITORING, ED: Glucose-Capillary: 112 mg/dL — ABNORMAL HIGH (ref 70–99)

## 2022-06-19 MED ORDER — FAMOTIDINE 20 MG PO TABS
20.0000 mg | ORAL_TABLET | Freq: Every day | ORAL | Status: DC
  Administered 2022-06-20 – 2022-06-21 (×3): 20 mg via ORAL
  Filled 2022-06-19 (×3): qty 1

## 2022-06-19 MED ORDER — NALOXONE HCL 0.4 MG/ML IJ SOLN: 0.4000 mg | INTRAMUSCULAR | Status: DC | PRN

## 2022-06-19 MED ORDER — FENTANYL CITRATE PF 50 MCG/ML IJ SOSY
50.0000 ug | PREFILLED_SYRINGE | Freq: Once | INTRAMUSCULAR | Status: AC
  Administered 2022-06-19: 50 ug via INTRAVENOUS
  Filled 2022-06-19: qty 1

## 2022-06-19 MED ORDER — FERRIC MALTOL 30 MG PO CAPS: 1.0000 | ORAL_CAPSULE | Freq: Two times a day (BID) | ORAL | Status: DC

## 2022-06-19 MED ORDER — MIRABEGRON ER 25 MG PO TB24
25.0000 mg | ORAL_TABLET | Freq: Every day | ORAL | Status: DC
  Administered 2022-06-20 – 2022-06-21 (×2): 25 mg via ORAL
  Filled 2022-06-19 (×3): qty 1

## 2022-06-19 MED ORDER — BACLOFEN 20 MG PO TABS
20.0000 mg | ORAL_TABLET | Freq: Every day | ORAL | Status: DC
  Administered 2022-06-20 – 2022-06-21 (×2): 20 mg via ORAL
  Filled 2022-06-19 (×2): qty 1

## 2022-06-19 MED ORDER — TAMSULOSIN HCL 0.4 MG PO CAPS
0.4000 mg | ORAL_CAPSULE | Freq: Every day | ORAL | Status: DC
  Administered 2022-06-20 – 2022-06-21 (×2): 0.4 mg via ORAL
  Filled 2022-06-19 (×2): qty 1

## 2022-06-19 MED ORDER — TETANUS-DIPHTH-ACELL PERTUSSIS 5-2.5-18.5 LF-MCG/0.5 IM SUSY
0.5000 mL | PREFILLED_SYRINGE | Freq: Once | INTRAMUSCULAR | Status: AC
  Administered 2022-06-19: 0.5 mL via INTRAMUSCULAR
  Filled 2022-06-19: qty 0.5

## 2022-06-19 MED ORDER — ALBUTEROL SULFATE HFA 108 (90 BASE) MCG/ACT IN AERS: 2.0000 | INHALATION_SPRAY | Freq: Four times a day (QID) | RESPIRATORY_TRACT | Status: DC | PRN

## 2022-06-19 MED ORDER — FLUOXETINE HCL 20 MG PO CAPS
40.0000 mg | ORAL_CAPSULE | Freq: Every day | ORAL | Status: DC
  Administered 2022-06-20 – 2022-06-21 (×2): 40 mg via ORAL
  Filled 2022-06-19 (×2): qty 2

## 2022-06-19 MED ORDER — DIAZEPAM 5 MG/ML IJ SOLN
2.5000 mg | Freq: Once | INTRAMUSCULAR | Status: AC
  Administered 2022-06-19: 2.5 mg via INTRAVENOUS
  Filled 2022-06-19: qty 2

## 2022-06-19 MED ORDER — POTASSIUM CHLORIDE CRYS ER 20 MEQ PO TBCR
40.0000 meq | EXTENDED_RELEASE_TABLET | Freq: Once | ORAL | Status: AC
  Administered 2022-06-20: 40 meq via ORAL
  Filled 2022-06-19: qty 2

## 2022-06-19 MED ORDER — LIDOCAINE-EPINEPHRINE (PF) 2 %-1:200000 IJ SOLN
10.0000 mL | Freq: Once | INTRAMUSCULAR | Status: AC
  Administered 2022-06-19: 10 mL
  Filled 2022-06-19: qty 20

## 2022-06-19 MED ORDER — PANTOPRAZOLE SODIUM 40 MG PO TBEC
40.0000 mg | DELAYED_RELEASE_TABLET | Freq: Every day | ORAL | Status: DC
  Administered 2022-06-20 – 2022-06-21 (×2): 40 mg via ORAL
  Filled 2022-06-19 (×2): qty 1

## 2022-06-19 MED ORDER — TRIMETHOPRIM 100 MG PO TABS
100.0000 mg | ORAL_TABLET | Freq: Every day | ORAL | Status: DC
  Administered 2022-06-20 – 2022-06-21 (×2): 100 mg via ORAL
  Filled 2022-06-19 (×3): qty 1

## 2022-06-19 MED ORDER — METHENAMINE HIPPURATE 1 G PO TABS: 1.0000 g | ORAL_TABLET | Freq: Two times a day (BID) | ORAL | Status: DC

## 2022-06-19 MED ORDER — DOCUSATE SODIUM 50 MG PO CAPS: 50.0000 mg | ORAL_CAPSULE | Freq: Every day | ORAL | Status: DC | PRN

## 2022-06-19 MED ORDER — LEVOTHYROXINE SODIUM 100 MCG PO TABS
100.0000 ug | ORAL_TABLET | Freq: Every day | ORAL | Status: DC
  Administered 2022-06-20 – 2022-06-22 (×3): 100 ug via ORAL
  Filled 2022-06-19 (×3): qty 1

## 2022-06-19 MED ORDER — TEMAZEPAM 15 MG PO CAPS
15.0000 mg | ORAL_CAPSULE | Freq: Every day | ORAL | Status: DC
  Administered 2022-06-20 – 2022-06-21 (×2): 15 mg via ORAL
  Filled 2022-06-19 (×2): qty 1

## 2022-06-19 MED ORDER — AMPHETAMINE-DEXTROAMPHETAMINE 10 MG PO TABS
20.0000 mg | ORAL_TABLET | Freq: Every day | ORAL | Status: DC
  Administered 2022-06-20 – 2022-06-21 (×2): 20 mg via ORAL
  Filled 2022-06-19 (×2): qty 2

## 2022-06-19 MED ORDER — FENTANYL CITRATE PF 50 MCG/ML IJ SOSY
12.5000 ug | PREFILLED_SYRINGE | INTRAMUSCULAR | Status: DC | PRN
  Administered 2022-06-20 – 2022-06-21 (×5): 12.5 ug via INTRAVENOUS
  Filled 2022-06-19 (×6): qty 1

## 2022-06-19 NOTE — ED Notes (Signed)
Pt. CBG 112, RN made aware. ?

## 2022-06-19 NOTE — H&P (Signed)
History and Physical    ABRIL NEEDLER [id number] DOB: 17-Nov-1950 DOA: 06/19/2022  PCP: Janith Lima, MD  Patient coming from: Home  Chief Complaint: Fall  HPI: Kayla Kayla [name] is a 72 y.o. female with medical history significant of MS, hypothyroidism, cough variant asthma versus vocal cord dysfunction related to MS, chronic constipation, iron deficiency anemia, recurrent UTIs, cervical cancer, osteoporosis, sleep apnea, history of right hip replacement presented to the ED from home after a mechanical fall.  Patient reported hitting her head on the floor and also complained of right hip pain.  Vital signs stable.  Labs significant for no leukocytosis, hemoglobin 11.0 (was 11.8 on 05/26/2022 and previously 12-13 on labs done 5-6 months ago), potassium 3.2, calcium 6.8 (albumin level not checked), UA pending.  Chest x-ray showing mild left basilar atelectasis.  X-ray of right hip/pelvis showing evidence of prosthetic dislocation but no acute fracture.  CT head/C-spine negative for acute finding.  CT of right hip without contrast showing: "IMPRESSION: 1. Full shaft width inferior right pubic rami fracture. Associated nondisplaced right posterior acetabulum fracture. Suggestion of an acute nondisplaced anterior right acetabular fracture 2. Total right hip arthroplasty. No CT finding to suggest surgical hardware complication. 3. Foci of subcutaneus soft tissue emphysema along the anterior right pelvis as well as multiple foci of emphysema along the right proximal medial thigh of unclear etiology. Finding may be related to infection versus trauma more distally. Correlate with physical exam."  Patient received Valium, fentanyl, and Tdap injection.  ED physician spoke to Dr. Rolena Infante with orthopedics who recommended nonsurgical management including PT/OT, and pain control.  Orthopedic service will consult in the morning, okay for the patient to eat tonight.  Patient states she has MS and  normally uses a walker to ambulate but today she had to make a short trip to the kitchen and did not have her walker with her when she lost her balance and fell on her right side.  She reports hitting her head on the floor but denies loss of consciousness.  Reporting right-sided hip/pelvic pain.  She does not take any blood thinners.  She reports chronic shortness of breath and cough due to vocal cord dysfunction for which she is seen by pulmonology.  She does not think her respiratory symptoms are any worse from her baseline.  Denies fevers, chest pain, nausea, vomiting, abdominal pain, or diarrhea.  She reports chronic constipation for which she takes Colace at home.  Review of Systems:  Review of Systems  All other systems reviewed and are negative.   Past Medical History:  Diagnosis Date   Arthritis    oa   Cervical cancer ([place]) 2005   COPD (chronic obstructive pulmonary disease) ([place])    Multiple sclerosis ([place])    Multiple sclerosis ([place]) 1996   Neuromuscular disorder ([place])    Osteoporosis    Other diseases of vocal cords    vocal cords are not even   Other voice and resonance disorders    Pneumonia 2014, 1992   Sleep apnea    no cpap used   Unspecified hypothyroidism     Past Surgical History:  Procedure Laterality Date   ABDOMINAL HYSTERECTOMY  03/25/2003   for cervical cancer, complete   BRONCHIAL BIOPSY  10/26/2020   Procedure: BRONCHIAL BIOPSIES;  Surgeon: Garner Nash, DO;  Location: [facility];  Service: Pulmonary;;   BRONCHIAL BRUSHINGS  10/26/2020   Procedure: BRONCHIAL BRUSHINGS;  Surgeon: Garner Nash, DO;  Location: [facility];  Service: Pulmonary;;   BRONCHIAL WASHINGS  10/26/2020   Procedure: BRONCHIAL WASHINGS;  Surgeon: Garner Nash, DO;  Location: [facility];  Service: Pulmonary;;   CATARACT EXTRACTION, BILATERAL Bilateral    lens for cataracts   CONVERSION TO TOTAL HIP Right 09/18/2015   Procedure: CONVERSION OF RIGHT HEMI ARTHROPLASTY  TO TOTAL HIP ARTHROPLASTY ACETABULAR REVISON ;  Surgeon: Paralee Cancel, MD;  Location: [facility];  Service: Orthopedics;  Laterality: Right;   HIP ARTHROPLASTY Right 03/01/2013   Procedure: ARTHROPLASTY BIPOLAR HIP;  Surgeon: Mauri Pole, MD;  Location: [facility];  Service: Orthopedics;  Laterality: Right;   HIP CLOSED REDUCTION Right 03/12/2013   Procedure: CLOSED REDUCTION HIP;  Surgeon: Gearlean Alf, MD;  Location: [facility];  Service: Orthopedics;  Laterality: Right;   HIP CLOSED REDUCTION Right 03/19/2013   Procedure: CLOSED REDUCTION HIP;  Surgeon: Johnn Hai, MD;  Location: [facility];  Service: Orthopedics;  Laterality: Right;   HIP CLOSED REDUCTION Right 11/10/2015   Procedure: CLOSED MANIPULATION HIP;  Surgeon: Rod Can, MD;  Location: [facility];  Service: Orthopedics;  Laterality: Right;   HIP CLOSED REDUCTION Right 06/05/2017   Procedure: CLOSED REDUCTION HIP;  Surgeon: [name], MD;  Location: [facility];  Service: Orthopedics;  Laterality: Right;   SMALL INTESTINE SURGERY     TOTAL HIP REVISION Right 06/06/2017   Procedure: TOTAL HIP REVISION;  Surgeon: [name], MD;  Location: [facility];  Service: Orthopedics;  Laterality: Right;   VESICOVAGINAL FISTULA CLOSURE W/ TAH  03/25/2003   VIDEO BRONCHOSCOPY N/A 10/26/2020   Procedure: VIDEO BRONCHOSCOPY WITH FLUORO;  Surgeon: Garner Nash, DO;  Location: [facility];  Service: Pulmonary;  Laterality: N/A;     reports that she has never smoked. She has never used smokeless tobacco. She reports current alcohol use of about 2.0 standard drinks of alcohol per week. She reports that she does not use drugs.  Allergies  Allergen Reactions   Tramadol     hallucinations    Contrast Media [Iodinated Contrast Media] Rash   Iohexol Hives   Morphine And Related Other (See Comments)    Reaction:  Hallucinations    Methylprednisolone Other (See Comments)    Reaction:  Decreases pts heart rate    Hydrocodone Itching    Oxycodone-Acetaminophen Rash and Other (See Comments)    Reaction:  Hallucinations    Phenytoin Sodium Extended Rash   Zanaflex [Tizanidine Hydrochloride] Rash    Family History  Problem Relation Age of Onset   Other Mother        COVID   Colon cancer Father    Atrial fibrillation Brother    Heart attack Maternal Grandfather    Parkinson's disease Paternal Grandfather    Esophageal cancer Neg Hx    Stomach cancer Neg Hx    Rectal cancer Neg Hx     Prior to Admission medications   Medication Sig Start Date End Date Taking? Authorizing Provider  albuterol (VENTOLIN HFA) 108 (90 Base) MCG/ACT inhaler INHALE 2 PUFFS INTO LUNGS EVERY 6 HOURS AS NEEDED. 11/03/20   Tanda Rockers, MD  amphetamine-dextroamphetamine (ADDERALL) 20 MG tablet Take 1 tablet (20 mg total) by mouth daily. 05/24/22   Janith Lima, MD  baclofen (LIORESAL) 20 MG tablet Take 20 mg by mouth 3 (three) times daily as needed for muscle spasms.    Love, Pamela S, PA-C  CALCIUM PO Take 1 tablet by mouth daily.    [provider]  dexlansoprazole Ross Marcus)  60 MG capsule TAKE 1 CAPSULE BY MOUTH  ONCE DAILY WITH BREAKFAST 05/12/22   Janith Lima, MD  famotidine (PEPCID) 20 MG tablet One after supper 06/07/20   Tanda Rockers, MD  Ferric Maltol (ACCRUFER) 30 MG CAPS Take 1 capsule by mouth in the morning and at bedtime. 03/18/22   Janith Lima, MD  FLUoxetine (PROZAC) 40 MG capsule Take 1 capsule (40 mg total) by mouth daily. 04/09/22   Janith Lima, MD  Fluticasone-Umeclidin-Vilant (TRELEGY ELLIPTA) 100-62.5-25 MCG/ACT AEPB Inhale 1 puff into the lungs daily. 05/26/22   Janith Lima, MD  furosemide (LASIX) 20 MG tablet TAKE 1 TABLET BY MOUTH DAILY 05/02/22   Tanda Rockers, MD  Immune Globulin, Human,-klhw (XEMBIFY [place]) Inject into the skin.    [provider]  levothyroxine (SYNTHROID) 100 MCG tablet TAKE 1 TABLET BY MOUTH ONCE  DAILY BEFORE BREAKFAST 12/31/21   Tanda Rockers, MD  methenamine  (HIPREX) 1 g tablet Take 1 tablet (1 g total) by mouth 2 (two) times daily with a meal. 03/01/22   Janith Lima, MD  Multiple Vitamin (MULTIVITAMIN WITH MINERALS) TABS tablet Take 1 tablet by mouth daily.    [provider]  pilocarpine (SALAGEN) 7.5 MG tablet Take 7.5 mg by mouth 2 (two) times daily. 09/04/20   [provider]  Probiotic Product (PROBIOTIC PO) Take 1 capsule by mouth daily. Harmony probiotic    [provider]  Respiratory Therapy Supplies (FLUTTER) DEVI Use as directed 06/02/17   Tanda Rockers, MD  sodium chloride HYPERTONIC 3 % nebulizer solution Take by nebulization 2 (two) times daily as needed for cough. 03/11/22   Martyn Ehrich, NP  tamsulosin (FLOMAX) 0.4 MG CAPS capsule Take 0.4 mg by mouth daily.    [provider]  temazepam (RESTORIL) 15 MG capsule Take 1 capsule (15 mg total) by mouth at bedtime. 04/30/22   Janith Lima, MD  trimethoprim (TRIMPEX) 100 MG tablet Take 100 mg by mouth daily. 11/02/21   [provider]  Vibegron (GEMTESA) 75 MG TABS Take by mouth.    [provider]    Physical Exam: Vitals:   06/19/22 1919 06/19/22 1930 06/19/22 1945  BP: (!) 105/48 111/89 125/67  Pulse: 68 69 64  Resp: 15 12   Temp: 97.9 F (36.6 C)    TempSrc: Oral    SpO2: 98% 99% 98%    Physical Exam Vitals reviewed.  Constitutional:      General: She is not in acute distress. HENT:     Head: Normocephalic and atraumatic.  Eyes:     Extraocular Movements: Extraocular movements intact.  Cardiovascular:     Rate and Rhythm: Normal rate and regular rhythm.     Pulses: Normal pulses.  Pulmonary:     Effort: Pulmonary effort is normal. No respiratory distress.     Breath sounds: No wheezing or rales.  Abdominal:     General: Bowel sounds are normal. There is no distension.     Palpations: Abdomen is soft.     Tenderness: There is no abdominal tenderness.  Musculoskeletal:     Cervical back: Normal range  of motion.     Right lower leg: Edema present.     Left lower leg: Edema present.     Comments: Trace bilateral lower extremity edema  Skin:    General: Skin is warm and dry.  Neurological:     General: No focal deficit present.  Mental Status: She is alert and oriented to person, place, and time.     Labs on Admission: I have personally reviewed following labs and imaging studies  CBC: Recent Labs  Lab 06/19/22 1921  WBC 9.1  NEUTROABS 7.2  HGB 11.0*  HCT 34.7*  MCV 91.6  PLT [id number]   Basic Metabolic Panel: Recent Labs  Lab 06/19/22 1921  NA 136  K 3.2*  CL 109  CO2 22  GLUCOSE 85  BUN 18  CREATININE 0.61  CALCIUM 6.8*   GFR: CrCl cannot be calculated (Unknown ideal weight.). Liver Function Tests: No results for input(s): "AST", "ALT", "ALKPHOS", "BILITOT", "PROT", "ALBUMIN" in the last 168 hours. No results for input(s): "LIPASE", "AMYLASE" in the last 168 hours. No results for input(s): "AMMONIA" in the last 168 hours. Coagulation Profile: No results for input(s): "INR", "PROTIME" in the last 168 hours. Cardiac Enzymes: No results for input(s): "CKTOTAL", "CKMB", "CKMBINDEX", "TROPONINI" in the last 168 hours. BNP (last 3 results) Recent Labs    01/14/22 0824  PROBNP 112.0*   HbA1C: No results for input(s): "HGBA1C" in the last 72 hours. CBG: No results for input(s): "GLUCAP" in the last 168 hours. Lipid Profile: No results for input(s): "CHOL", "HDL", "LDLCALC", "TRIG", "CHOLHDL", "LDLDIRECT" in the last 72 hours. Thyroid Function Tests: No results for input(s): "TSH", "T4TOTAL", "FREET4", "T3FREE", "THYROIDAB" in the last 72 hours. Anemia Panel: No results for input(s): "VITAMINB12", "FOLATE", "FERRITIN", "TIBC", "IRON", "RETICCTPCT" in the last 72 hours. Urine analysis:    Component Value Date/Time   COLORURINE YELLOW 05/26/2022 1034   APPEARANCEUR Sl Cloud[place] (A) 05/26/2022 1034   LABSPEC 1.010 05/26/2022 1034   PHURINE 7.0 05/26/2022 1034    GLUCOSEU NEGATIVE 05/26/2022 1034   HGBUR TRACE-INTACT (A) 05/26/2022 1034   BILIRUBINUR NEGATIVE 05/26/2022 1034   BILIRUBINUR negative 07/10/2021 0919   KETONESUR NEGATIVE 05/26/2022 1034   PROTEINUR Negative 07/10/2021 0919   PROTEINUR NEGATIVE 11/07/2016 2359   UROBILINOGEN 0.2 05/26/2022 1034   NITRITE NEGATIVE 05/26/2022 1034   LEUKOCYTESUR MODERATE (A) 05/26/2022 1034    Radiological Exams on Admission: CT Hip Right Wo Contrast  Result Date: 06/19/2022 CLINICAL DATA:  Hip trauma, fracture suspected, xray done EXAM: CT OF THE RIGHT HIP WITHOUT CONTRAST TECHNIQUE: Multidetector CT imaging of the right hip was performed according to the standard protocol. Multiplanar CT image reconstructions were also generated. RADIATION DOSE REDUCTION: This exam was performed according to the departmental dose-optimization program which includes automated exposure control, adjustment of the mA and/or kV according to patient size and/or use of iterative reconstruction technique. COMPARISON:  X-ray right hip 06/19/2022 FINDINGS: Bones/Joint/Cartilage Total right hip arthroplasty. Full shaft width inferior right pubic rami fracture. Associated nondisplaced right posterior acetabulum fracture (3:55). Suggestion of an acute nondisplaced anterior right acetabular fracture (9:56). Ligaments Suboptimally assessed by CT. Muscles and Tendons Grossly unremarkable. Soft tissues Gluteal coarsened calcifications along the ischial tuberosities and lower medial gluteal soft tissues. Foci of subcutaneus soft tissue emphysema along the anterior right pelvis as well as multiple foci of emphysema along the right proximal medial thigh of unclear etiology. Other: Lymph node dissection vascular clips throughout the pelvis. IMPRESSION: 1. Full shaft width inferior right pubic rami fracture. Associated nondisplaced right posterior acetabulum fracture. Suggestion of an acute nondisplaced anterior right acetabular fracture 2. Total right  hip arthroplasty. No CT finding to suggest surgical hardware complication. 3. Foci of subcutaneus soft tissue emphysema along the anterior right pelvis as well as multiple foci of emphysema along the right  proximal medial thigh of unclear etiology. Finding may be related to infection versus trauma more distally. Correlate with physical exam. Electronically Signed   By: Iven Finn M.D.   On: 06/19/2022 22:06   CT Head Wo Contrast  Result Date: 06/19/2022 CLINICAL DATA:  Polytrauma, blunt.  Mechanical fall. EXAM: CT HEAD WITHOUT CONTRAST CT CERVICAL SPINE WITHOUT CONTRAST TECHNIQUE: Multidetector CT imaging of the head and cervical spine was performed following the standard protocol without intravenous contrast. Multiplanar CT image reconstructions of the cervical spine were also generated. RADIATION DOSE REDUCTION: This exam was performed according to the departmental dose-optimization program which includes automated exposure control, adjustment of the mA and/or kV according to patient size and/or use of iterative reconstruction technique. COMPARISON:  08/13/2021, 10/27/2003. FINDINGS: CT HEAD FINDINGS Brain: No acute intracranial hemorrhage, midline shift or mass effect. No extra-axial fluid collection. Periventricular white matter hypodensities are present bilaterally. No hydrocephalus. Vascular: No hyperdense vessel or unexpected calcification. Skull: Normal. Negative for fracture or focal lesion. Sinuses/Orbits: Small opacity is noted in the ethmoid air cells on the right. No acute orbital abnormality. Other: Small subcutaneous fat stranding over the occipital bone on the right, possible contusion. CT CERVICAL SPINE FINDINGS Alignment: There is mild anterolisthesis at C3-C4, C4-C5, and C7-T1. Skull base and vertebrae: No acute fracture. No primary bone lesion or focal pathologic process. Soft tissues and spinal canal: No prevertebral fluid or swelling. No visible canal hematoma. Disc levels: Multilevel  intervertebral disc space narrowing, uncovertebral osteophyte formation and facet arthropathy, most pronounced at C5-C6 and C6-C7. Upper chest: No acute abnormality. Other: None. IMPRESSION: 1. No acute intracranial process. 2. Chronic microvascular ischemic changes. 3. Multilevel degenerative changes in the cervical spine without evidence of acute fracture. Electronically Signed   By: Brett Fairy M.D.   On: 06/19/2022 20:29   CT Cervical Spine Wo Contrast  Result Date: 06/19/2022 CLINICAL DATA:  Polytrauma, blunt.  Mechanical fall. EXAM: CT HEAD WITHOUT CONTRAST CT CERVICAL SPINE WITHOUT CONTRAST TECHNIQUE: Multidetector CT imaging of the head and cervical spine was performed following the standard protocol without intravenous contrast. Multiplanar CT image reconstructions of the cervical spine were also generated. RADIATION DOSE REDUCTION: This exam was performed according to the departmental dose-optimization program which includes automated exposure control, adjustment of the mA and/or kV according to patient size and/or use of iterative reconstruction technique. COMPARISON:  08/13/2021, 10/27/2003. FINDINGS: CT HEAD FINDINGS Brain: No acute intracranial hemorrhage, midline shift or mass effect. No extra-axial fluid collection. Periventricular white matter hypodensities are present bilaterally. No hydrocephalus. Vascular: No hyperdense vessel or unexpected calcification. Skull: Normal. Negative for fracture or focal lesion. Sinuses/Orbits: Small opacity is noted in the ethmoid air cells on the right. No acute orbital abnormality. Other: Small subcutaneous fat stranding over the occipital bone on the right, possible contusion. CT CERVICAL SPINE FINDINGS Alignment: There is mild anterolisthesis at C3-C4, C4-C5, and C7-T1. Skull base and vertebrae: No acute fracture. No primary bone lesion or focal pathologic process. Soft tissues and spinal canal: No prevertebral fluid or swelling. No visible canal hematoma.  Disc levels: Multilevel intervertebral disc space narrowing, uncovertebral osteophyte formation and facet arthropathy, most pronounced at C5-C6 and C6-C7. Upper chest: No acute abnormality. Other: None. IMPRESSION: 1. No acute intracranial process. 2. Chronic microvascular ischemic changes. 3. Multilevel degenerative changes in the cervical spine without evidence of acute fracture. Electronically Signed   By: Brett Fairy M.D.   On: 06/19/2022 20:29   DG Chest Portable 1 View  Result Date: 06/19/2022  CLINICAL DATA:  Status post fall EXAM: PORTABLE CHEST 1 VIEW COMPARISON:  01/10/2022 FINDINGS: Cardiac shadow is within normal limits. Left basilar atelectasis is noted. Lungs are otherwise clear. No bony abnormality is noted. IMPRESSION: Mild left basilar atelectasis. Electronically Signed   By: Inez Catalina M.D.   On: 06/19/2022 20:02   DG HIP UNILAT WITH PELVIS 2-3 VIEWS RIGHT  Result Date: 06/19/2022 CLINICAL DATA:  Status post recent fall with right hip pain, initial encounter EXAM: DG HIP (WITH OR WITHOUT PELVIS) 3V RIGHT COMPARISON:  06/06/2017 FINDINGS: Dystrophic calcifications are again identified about the hip joints and within the gluteal soft tissues. Right hip replacement is again noted and appears well seated. No acute fracture is noted. No other focal abnormality is seen. IMPRESSION: No acute fracture noted. Evidence of prosthetic dislocation. Chronic dystrophic calcifications about the hip joints. Electronically Signed   By: Inez Catalina M.D.   On: 06/19/2022 19:59    EKG: Independently reviewed.  Sinus rhythm, no significant change since prior tracing.  Assessment and Plan  Inferior pubic rami fracture Nondisplaced right anterior and posterior acetabular fractures Secondary to a mechanical fall.  CT showing foci of subcutaneous soft tissue emphysema along the anterior right pelvis as well as multiple foci of emphysema along the right proximal medial thigh of unclear etiology, findings  possibly related to trauma.  Infection less likely given no fever or leukocytosis.  Orthopedics recommended nonoperative management, will consult in the morning.  Continue pain management, PT/OT.  Mild hypokalemia Monitor potassium and magnesium levels, continue to replace as needed.  ?Hypocalcemia Calcium level 6.8 on BMP.  CMP ordered to check repeat calcium level and albumin level.  Replace calcium if low.  MS Stable.  She was previously on Octrevus which was stopped last year due to recurrent infections.  She is now on weekly IVIG per immunology.  Continue home baclofen.  Hypothyroidism Continue Synthroid.  Cough variant asthma versus vocal cord dysfunction related to MS Stable.  Continue home meds.  Chronic iron deficiency anemia Hemoglobin 11.0 (was 11.8 on 05/26/2022 and previously 12-13 on labs done 5-6 months ago).  Continue to monitor CBC and continue home iron supplement.  Mood disorder/ADHD Continue home meds.  GERD Continue PPI and H2 blocker.  DVT prophylaxis: SCDs Code Status: Full Code (discussed with the patient) Family Communication: Husband and daughter at bedside. Level of care: Telemetry bed Admission status: It is my clinical opinion that referral for OBSERVATION is reasonable and necessary in this patient based on the above information provided. The aforementioned taken together are felt to place the patient at high risk for further clinical deterioration. However, it is anticipated that the patient may be medically stable for discharge from the hospital within 24 to 48 hours.   Shela Leff MD [facility]  If 7PM-7AM, please contact night-coverage [email or web address]  06/19/2022, 10:24 PM

## 2022-06-19 NOTE — ED Provider Notes (Signed)
[facility] Provider Note   CSN: [id number] Arrival date & time: 06/19/22  1903     History  Chief Complaint  Patient presents with   Lytle Michaels    Kayla [name] is a 72 y.o. female.  Pt is a 72 yo female with pmhx significant for MS, hypothyroidism, sleep apnea, sleep apnea, OA, and cervical cancer.  Pt said she tripped at home.  She hit the floor with her head.  She had no loc.  She thinks her hip is dislocated.  EMS gave her 100 mcg fentanyl IV en route.  She's had prior hip surgery and revisions by Dr. Gladstone Lighter ([place]).  Pt is supposed to walk with a walker.  She was not using it when she fell.       Home Medications Prior to Admission medications   Medication Sig Start Date End Date Taking? Authorizing Provider  albuterol (VENTOLIN HFA) 108 (90 Base) MCG/ACT inhaler INHALE 2 PUFFS INTO LUNGS EVERY 6 HOURS AS NEEDED. Patient taking differently: Inhale 2 puffs into the lungs every 6 (six) hours as needed for wheezing or shortness of breath. 11/03/20  Yes Tanda Rockers, MD  amphetamine-dextroamphetamine (ADDERALL) 20 MG tablet Take 1 tablet (20 mg total) by mouth daily. 05/24/22  Yes Janith Lima, MD  baclofen (LIORESAL) 20 MG tablet Take 20 mg by mouth in the morning.   Yes Love, Ivan Anchors, PA-C  CALCIUM PO Take 1 tablet by mouth daily.   Yes [provider]  dexlansoprazole (DEXILANT) 60 MG capsule TAKE 1 CAPSULE BY MOUTH  ONCE DAILY WITH BREAKFAST 05/12/22  Yes Janith Lima, MD  famotidine (PEPCID) 20 MG tablet One after supper Patient taking differently: Take 20 mg by mouth at bedtime. One after supper 06/07/20  Yes Tanda Rockers, MD  Ferric Maltol (ACCRUFER) 30 MG CAPS Take 1 capsule by mouth in the morning and at bedtime. 03/18/22  Yes Janith Lima, MD  FLUoxetine (PROZAC) 40 MG capsule Take 1 capsule (40 mg total) by mouth daily. 04/09/22  Yes Janith Lima, MD  Fluticasone-Umeclidin-Vilant (TRELEGY ELLIPTA)  100-62.5-25 MCG/ACT AEPB Inhale 1 puff into the lungs daily. 05/26/22  Yes Janith Lima, MD  furosemide (LASIX) 20 MG tablet TAKE 1 TABLET BY MOUTH DAILY 05/02/22  Yes Tanda Rockers, MD  levothyroxine (SYNTHROID) 100 MCG tablet TAKE 1 TABLET BY MOUTH ONCE  DAILY BEFORE BREAKFAST 12/31/21  Yes Tanda Rockers, MD  methenamine (HIPREX) 1 g tablet Take 1 tablet (1 g total) by mouth 2 (two) times daily with a meal. 03/01/22  Yes Janith Lima, MD  Multiple Vitamin (MULTIVITAMIN WITH MINERALS) TABS tablet Take 1 tablet by mouth daily.   Yes [provider]  Probiotic Product (PROBIOTIC PO) Take 1 capsule by mouth daily. Harmony probiotic   Yes [provider]  sodium chloride HYPERTONIC 3 % nebulizer solution Take by nebulization 2 (two) times daily as needed for cough. 03/11/22  Yes Martyn Ehrich, NP  tamsulosin (FLOMAX) 0.4 MG CAPS capsule Take 0.4 mg by mouth daily.   Yes [provider]  temazepam (RESTORIL) 15 MG capsule Take 1 capsule (15 mg total) by mouth at bedtime. 04/30/22  Yes Janith Lima, MD  trimethoprim (TRIMPEX) 100 MG tablet Take 100 mg by mouth daily. 11/02/21  Yes [provider]  Vibegron (GEMTESA) 75 MG TABS Take 1 tablet by mouth in the morning.   Yes [provider]  Immune Globulin, Human,-klhw (XEMBIFY Reading) Inject into the skin.    [provider]  Respiratory Therapy Supplies (FLUTTER) DEVI Use as directed 06/02/17   Tanda Rockers, MD      Allergies    Tramadol, Contrast media [iodinated contrast media], Iohexol, Morphine and related, Methylprednisolone, Hydrocodone, Oxycodone-acetaminophen, Phenytoin sodium extended, and Zanaflex [tizanidine hydrochloride]    Review of Systems   Review of Systems  Musculoskeletal:        Right hip pain  All other systems reviewed and are negative.   Physical Exam Updated Vital Signs BP 111/65   Pulse 65   Temp 97.9 F (36.6 C) (Oral)   Resp 16   SpO2 98%  Physical  Exam Vitals and nursing note reviewed.  Constitutional:      Appearance: Normal appearance.  HENT:     Head: Normocephalic.     Comments: Lac to parietal/occipital    Right Ear: External ear normal.     Left Ear: External ear normal.     Nose: Nose normal.     Mouth/Throat:     Mouth: Mucous membranes are moist.     Pharynx: Oropharynx is clear.  Eyes:     Extraocular Movements: Extraocular movements intact.     Conjunctiva/sclera: Conjunctivae normal.     Pupils: Pupils are equal, round, and reactive to light.  Cardiovascular:     Rate and Rhythm: Normal rate and regular rhythm.     Pulses: Normal pulses.     Heart sounds: Normal heart sounds.  Pulmonary:     Effort: Pulmonary effort is normal.     Breath sounds: Normal breath sounds.  Abdominal:     General: Abdomen is flat. Bowel sounds are normal.     Palpations: Abdomen is soft.  Musculoskeletal:     Cervical back: Normal range of motion and neck supple.     Right hip: Deformity present. Decreased range of motion.  Skin:    General: Skin is warm.     Capillary Refill: Capillary refill takes less than 2 seconds.  Neurological:     General: No focal deficit present.     Mental Status: She is alert and oriented to person, place, and time.  Psychiatric:        Mood and Affect: Mood normal.        Behavior: Behavior normal.     ED Results / Procedures / Treatments   Labs (all labs ordered are listed, but only abnormal results are displayed) Labs Reviewed  BASIC METABOLIC PANEL - Abnormal; Notable for the following components:      Result Value   Potassium 3.2 (*)    Calcium 6.8 (*)    All other components within normal limits  CBC WITH DIFFERENTIAL/PLATELET - Abnormal; Notable for the following components:   RBC 3.79 (*)    Hemoglobin 11.0 (*)    HCT 34.7 (*)    RDW 15.7 (*)    Abs Immature Granulocytes 0.09 (*)    All other components within normal limits  URINALYSIS, ROUTINE W REFLEX MICROSCOPIC     EKG None  Radiology CT Hip Right Wo Contrast  Result Date: 06/19/2022 CLINICAL DATA:  Hip trauma, fracture suspected, xray done EXAM: CT OF THE RIGHT HIP WITHOUT CONTRAST TECHNIQUE: Multidetector CT imaging of the right hip was performed according to the standard protocol. Multiplanar CT image reconstructions were also generated. RADIATION DOSE REDUCTION: This exam was performed according to the departmental dose-optimization program which includes automated exposure control, adjustment  of the mA and/or kV according to patient size and/or use of iterative reconstruction technique. COMPARISON:  X-ray right hip 06/19/2022 FINDINGS: Bones/Joint/Cartilage Total right hip arthroplasty. Full shaft width inferior right pubic rami fracture. Associated nondisplaced right posterior acetabulum fracture (3:55). Suggestion of an acute nondisplaced anterior right acetabular fracture (9:56). Ligaments Suboptimally assessed by CT. Muscles and Tendons Grossly unremarkable. Soft tissues Gluteal coarsened calcifications along the ischial tuberosities and lower medial gluteal soft tissues. Foci of subcutaneus soft tissue emphysema along the anterior right pelvis as well as multiple foci of emphysema along the right proximal medial thigh of unclear etiology. Other: Lymph node dissection vascular clips throughout the pelvis. IMPRESSION: 1. Full shaft width inferior right pubic rami fracture. Associated nondisplaced right posterior acetabulum fracture. Suggestion of an acute nondisplaced anterior right acetabular fracture 2. Total right hip arthroplasty. No CT finding to suggest surgical hardware complication. 3. Foci of subcutaneus soft tissue emphysema along the anterior right pelvis as well as multiple foci of emphysema along the right proximal medial thigh of unclear etiology. Finding may be related to infection versus trauma more distally. Correlate with physical exam. Electronically Signed   By: Iven Finn M.D.    On: 06/19/2022 22:06   CT Head Wo Contrast  Result Date: 06/19/2022 CLINICAL DATA:  Polytrauma, blunt.  Mechanical fall. EXAM: CT HEAD WITHOUT CONTRAST CT CERVICAL SPINE WITHOUT CONTRAST TECHNIQUE: Multidetector CT imaging of the head and cervical spine was performed following the standard protocol without intravenous contrast. Multiplanar CT image reconstructions of the cervical spine were also generated. RADIATION DOSE REDUCTION: This exam was performed according to the departmental dose-optimization program which includes automated exposure control, adjustment of the mA and/or kV according to patient size and/or use of iterative reconstruction technique. COMPARISON:  08/13/2021, 10/27/2003. FINDINGS: CT HEAD FINDINGS Brain: No acute intracranial hemorrhage, midline shift or mass effect. No extra-axial fluid collection. Periventricular white matter hypodensities are present bilaterally. No hydrocephalus. Vascular: No hyperdense vessel or unexpected calcification. Skull: Normal. Negative for fracture or focal lesion. Sinuses/Orbits: Small opacity is noted in the ethmoid air cells on the right. No acute orbital abnormality. Other: Small subcutaneous fat stranding over the occipital bone on the right, possible contusion. CT CERVICAL SPINE FINDINGS Alignment: There is mild anterolisthesis at C3-C4, C4-C5, and C7-T1. Skull base and vertebrae: No acute fracture. No primary bone lesion or focal pathologic process. Soft tissues and spinal canal: No prevertebral fluid or swelling. No visible canal hematoma. Disc levels: Multilevel intervertebral disc space narrowing, uncovertebral osteophyte formation and facet arthropathy, most pronounced at C5-C6 and C6-C7. Upper chest: No acute abnormality. Other: None. IMPRESSION: 1. No acute intracranial process. 2. Chronic microvascular ischemic changes. 3. Multilevel degenerative changes in the cervical spine without evidence of acute fracture. Electronically Signed   By: Brett Fairy M.D.   On: 06/19/2022 20:29   CT Cervical Spine Wo Contrast  Result Date: 06/19/2022 CLINICAL DATA:  Polytrauma, blunt.  Mechanical fall. EXAM: CT HEAD WITHOUT CONTRAST CT CERVICAL SPINE WITHOUT CONTRAST TECHNIQUE: Multidetector CT imaging of the head and cervical spine was performed following the standard protocol without intravenous contrast. Multiplanar CT image reconstructions of the cervical spine were also generated. RADIATION DOSE REDUCTION: This exam was performed according to the departmental dose-optimization program which includes automated exposure control, adjustment of the mA and/or kV according to patient size and/or use of iterative reconstruction technique. COMPARISON:  08/13/2021, 10/27/2003. FINDINGS: CT HEAD FINDINGS Brain: No acute intracranial hemorrhage, midline shift or mass effect. No extra-axial fluid collection.  Periventricular white matter hypodensities are present bilaterally. No hydrocephalus. Vascular: No hyperdense vessel or unexpected calcification. Skull: Normal. Negative for fracture or focal lesion. Sinuses/Orbits: Small opacity is noted in the ethmoid air cells on the right. No acute orbital abnormality. Other: Small subcutaneous fat stranding over the occipital bone on the right, possible contusion. CT CERVICAL SPINE FINDINGS Alignment: There is mild anterolisthesis at C3-C4, C4-C5, and C7-T1. Skull base and vertebrae: No acute fracture. No primary bone lesion or focal pathologic process. Soft tissues and spinal canal: No prevertebral fluid or swelling. No visible canal hematoma. Disc levels: Multilevel intervertebral disc space narrowing, uncovertebral osteophyte formation and facet arthropathy, most pronounced at C5-C6 and C6-C7. Upper chest: No acute abnormality. Other: None. IMPRESSION: 1. No acute intracranial process. 2. Chronic microvascular ischemic changes. 3. Multilevel degenerative changes in the cervical spine without evidence of acute fracture.  Electronically Signed   By: Brett Fairy M.D.   On: 06/19/2022 20:29   DG Chest Portable 1 View  Result Date: 06/19/2022 CLINICAL DATA:  Status post fall EXAM: PORTABLE CHEST 1 VIEW COMPARISON:  01/10/2022 FINDINGS: Cardiac shadow is within normal limits. Left basilar atelectasis is noted. Lungs are otherwise clear. No bony abnormality is noted. IMPRESSION: Mild left basilar atelectasis. Electronically Signed   By: Inez Catalina M.D.   On: 06/19/2022 20:02   DG HIP UNILAT WITH PELVIS 2-3 VIEWS RIGHT  Result Date: 06/19/2022 CLINICAL DATA:  Status post recent fall with right hip pain, initial encounter EXAM: DG HIP (WITH OR WITHOUT PELVIS) 3V RIGHT COMPARISON:  06/06/2017 FINDINGS: Dystrophic calcifications are again identified about the hip joints and within the gluteal soft tissues. Right hip replacement is again noted and appears well seated. No acute fracture is noted. No other focal abnormality is seen. IMPRESSION: No acute fracture noted. Evidence of prosthetic dislocation. Chronic dystrophic calcifications about the hip joints. Electronically Signed   By: Inez Catalina M.D.   On: 06/19/2022 19:59    Procedures .Marland KitchenLaceration Repair  Date/Time: 06/19/2022 9:29 PM  Performed by: Isla Pence, MD Authorized by: Isla Pence, MD   Consent:    Consent obtained:  Verbal   Consent given by:  Patient Universal protocol:    Patient identity confirmed:  Verbally with patient Anesthesia:    Anesthesia method:  Local infiltration   Local anesthetic:  Lidocaine 2% WITH epi Laceration details:    Location:  Scalp   Scalp location:  R parietal   Length (cm):  1 Pre-procedure details:    Preparation:  Patient was prepped and draped in usual sterile fashion Exploration:    Hemostasis achieved with:  Epinephrine   Contaminated: no   Treatment:    Area cleansed with:  Povidone-iodine   Amount of cleaning:  Standard Skin repair:    Repair method:  Staples   Number of staples:   3 Approximation:    Approximation:  Close Repair type:    Repair type:  Simple Post-procedure details:    Dressing:  Open (no dressing)   Procedure completion:  Tolerated well, no immediate complications     Medications Ordered in ED Medications  fentaNYL (SUBLIMAZE) injection 50 mcg (50 mcg Intravenous Given 06/19/22 1932)  diazepam (VALIUM) injection 2.5 mg (2.5 mg Intravenous Given 06/19/22 1945)  lidocaine-EPINEPHrine (XYLOCAINE W/EPI) 2 %-1:200000 (PF) injection 10 mL (10 mLs Infiltration Given 06/19/22 2156)  Tdap (BOOSTRIX) injection 0.5 mL (0.5 mLs Intramuscular Given 06/19/22 2155)  fentaNYL (SUBLIMAZE) injection 50 mcg (50 mcg Intravenous Given 06/19/22 2155)  ED Course/ Medical Decision Making/ A&P                             Medical Decision Making Amount and/or Complexity of Data Reviewed Labs: ordered. Radiology: ordered.  Risk Prescription drug management. Decision regarding hospitalization.   This patient presents to the ED for concern of fall, this involves an extensive number of treatment options, and is a complaint that carries with it a high risk of complications and morbidity.  The differential diagnosis includes multiple trauma   Co morbidities that complicate the patient evaluation  MS, hypothyroidism, sleep apnea, sleep apnea, OA, and cervical cancer   Additional history obtained:  Additional history obtained from epic chart review External records from outside source obtained and reviewed including EMS report   Lab Tests:  I Ordered, and personally interpreted labs.  The pertinent results include:  cbc with hgb 11 (chronic), bmp with k sl low at 3.2   Imaging Studies ordered:  I ordered imaging studies including ct head, ct c-spine, hip, ct hip, cxr  I independently visualized and interpreted imaging which showed  CT head/c-spine: No acute intracranial process.  2. Chronic microvascular ischemic changes.  3. Multilevel degenerative  changes in the cervical spine without  evidence of acute fracture.  Hip: No acute fracture noted.    Evidence of prosthetic dislocation.    Chronic dystrophic calcifications about the hip joints.  CXR: Mild left basilar atelectasis.  CT hip: Full shaft width inferior right pubic rami fracture. Associated  nondisplaced right posterior acetabulum fracture. Suggestion of an  acute nondisplaced anterior right acetabular fracture  2. Total right hip arthroplasty. No CT finding to suggest surgical  hardware complication.  3. Foci of subcutaneus soft tissue emphysema along the anterior  right pelvis as well as multiple foci of emphysema along the right  proximal medial thigh of unclear etiology. Finding may be related to  infection versus trauma more distally. Correlate with physical exam.   I agree with the radiologist interpretation   Cardiac Monitoring:  The patient was maintained on a cardiac monitor.  I personally viewed and interpreted the cardiac monitored which showed an underlying rhythm of: nsr   Medicines ordered and prescription drug management:  I ordered medication including fentanyl/valium  for pain  Reevaluation of the patient after these medicines showed that the patient improved I have reviewed the patients home medicines and have made adjustments as needed   Test Considered:  ct   Critical Interventions:  Pain control   Consultations Obtained:  I requested consultation with the orthopedist (Dr. Rolena Infante),  and discussed lab and imaging findings as well as pertinent plan - he recommends admission to medicine for pain control.  He will see patient in the morning.  He does not think she will require surgery.  Pt ok to eat/drink. Pt d/w Dr. Marlowe Sax ([facility]) for admission   Problem List / ED Course:  Acetabular/pubic rami fracture:  admit for pain control.  Ortho to see in the am.  [facility] to admit. Scalp lac:  stapled   Reevaluation:  After the  interventions noted above, I reevaluated the patient and found that they have :improved   Social Determinants of Health:  Lives at home   Dispostion:  After consideration of the diagnostic results and the patients response to treatment, I feel that the patent would benefit from admission.          Final Clinical Impression(s) /  ED Diagnoses Final diagnoses:  Fall, initial encounter  Laceration of scalp, initial encounter  Closed fracture of ramus of right pubis, initial encounter ([place])  Closed nondisplaced fracture of right acetabulum, unspecified portion of acetabulum, initial encounter [facility])    Rx / [place] Orders ED Discharge Orders     None         Isla Pence, MD 06/19/22 2241

## 2022-06-19 NOTE — ED Triage Notes (Signed)
Patient BIB EMS from home c/o mechanical fall. Per report pt hit her head in the floor. Pt denies LOC. Pt c/o Right hip pain. Pt not on blood thinners.  200 ml NS and 123mcg fentanyl IV Given by EMS.

## 2022-06-19 NOTE — ED Notes (Signed)
ED TO INPATIENT HANDOFF REPORT  Name/Age/Gender Kayla [name] 72 y.o. female  Code Status Code Status History     Date Active Date Inactive Code Status Order ID Comments User Context   10/23/2020 1735 10/25/2020 1756 Full Code [id number]  Kayla Halt, MD ED   09/27/2020 1428 09/29/2020 1824 Full Code [id number]  Kayla Finner, DO Inpatient   06/06/2017 1550 06/09/2017 1549 Full Code [id number]  Kayla Maudlin, MD Inpatient   06/05/2017 2025 06/06/2017 1549 Full Code [id number]  Kayla [name] Inpatient   09/18/2015 1825 09/20/2015 1704 Full Code [id number]  Kayla [name] Inpatient   03/08/2013 1725 03/13/2013 0258 Full Code [id number]  Kayla Leriche, PA-C Inpatient   03/01/2013 1655 03/08/2013 1725 Full Code [id number]  Kayla Loveless, PA-C Inpatient   02/23/2013 2354 03/01/2013 1655 Full Code [id number]  Kayla Polite, MD Inpatient       Home/SNF/Other Home  Chief Complaint Pelvic fracture [id number]]  Level of Care/Admitting Diagnosis ED Disposition     ED Disposition  Admit   Condition  --   Comment  Hospital Area: [place] [[id number]]  Level of Care: Telemetry [5]  Admit to tele based on following criteria: Monitor QTC interval  May place patient in observation at [facility] or [place] if equivalent level of care is available:: Yes  Covid Evaluation: Asymptomatic - no recent exposure (last 10 days) testing not required  Diagnosis: Pelvic fracture [id number]  Admitting Physician: Shela Leff [id number]  Attending Physician: Shela Leff [id number]          Medical History Past Medical History:  Diagnosis Date   Arthritis    oa   Cervical cancer ([place]) 2005   COPD (chronic obstructive pulmonary disease) ([place])    Multiple sclerosis ([place])    Multiple sclerosis ([place]) 1996   Neuromuscular disorder ([place])    Osteoporosis    Other diseases of vocal cords    vocal cords are not even   Other voice and resonance disorders     Pneumonia 2014, 1992   Sleep apnea    no cpap used   Unspecified hypothyroidism     Allergies Allergies  Allergen Reactions   Tramadol     hallucinations    Contrast Media [Iodinated Contrast Media] Rash   Iohexol Hives   Morphine And Related Other (See Comments)    Reaction:  Hallucinations    Methylprednisolone Other (See Comments)    Reaction:  Decreases pts heart rate    Hydrocodone Itching   Oxycodone-Acetaminophen Rash and Other (See Comments)    Reaction:  Hallucinations    Phenytoin Sodium Extended Rash   Zanaflex [Tizanidine Hydrochloride] Rash    IV Location/Drains/Wounds Patient Lines/Drains/Airways Status     Active Line/Drains/Airways     Name Placement date Placement time Site Days   Peripheral IV 10/26/20 18 G Left;Posterior Wrist 10/26/20  0642  Wrist  601   Peripheral IV 06/19/22 Left Antecubital 06/19/22  1923  Antecubital  less than 1   Wound / Incision (Open or Dehisced) 04/10/19 Laceration Leg Lower;Right skin tear 04/10/19  2130  Leg  1166            Labs/Imaging Results for orders placed or performed during the hospital encounter of 06/19/22 (from the past 48 hour(s))  Basic metabolic panel     Status: Abnormal   Collection Time: 06/19/22  7:21 PM  Result Value Ref Range   Sodium 136 135 - 145 mmol/L   Potassium 3.2 (  L) 3.5 - 5.1 mmol/L   Chloride 109 98 - 111 mmol/L   CO2 22 22 - 32 mmol/L   Glucose, Bld 85 70 - 99 mg/dL    Comment: Glucose reference range applies only to samples taken after fasting for at least 8 hours.   BUN 18 8 - 23 mg/dL   Creatinine, Ser 0.61 0.44 - 1.00 mg/dL   Calcium 6.8 (L) 8.9 - 10.3 mg/dL   GFR, Estimated >60 >60 mL/min    Comment: (NOTE) Calculated using the CKD-EPI Creatinine Equation (2021)    Anion gap 5 5 - 15    Comment: Performed at [facility], [place] [address] [place], [place] [address]  CBC with Differential     Status: Abnormal   Collection Time: 06/19/22  7:21 PM  Result  Value Ref Range   WBC 9.1 4.0 - 10.5 K/uL   RBC 3.79 (L) 3.87 - 5.11 MIL/uL   Hemoglobin 11.0 (L) 12.0 - 15.0 g/dL   HCT 34.7 (L) 36.0 - 46.0 %   MCV 91.6 80.0 - 100.0 fL   MCH 29.0 26.0 - 34.0 pg   MCHC 31.7 30.0 - 36.0 g/dL   RDW 15.7 (H) 11.5 - 15.5 %   Platelets 268 150 - 400 K/uL   nRBC 0.0 0.0 - 0.2 %   Neutrophils Relative % 79 %   Neutro Abs 7.2 1.7 - 7.7 K/uL   Lymphocytes Relative 13 %   Lymphs Abs 1.2 0.7 - 4.0 K/uL   Monocytes Relative 6 %   Monocytes Absolute 0.6 0.1 - 1.0 K/uL   Eosinophils Relative 0 %   Eosinophils Absolute 0.0 0.0 - 0.5 K/uL   Basophils Relative 1 %   Basophils Absolute 0.1 0.0 - 0.1 K/uL   Immature Granulocytes 1 %   Abs Immature Granulocytes 0.09 (H) 0.00 - 0.07 K/uL    Comment: Performed at [facility], [place] [address], [place], [place] [address]   CT Hip Right Wo Contrast  Result Date: 06/19/2022 CLINICAL DATA:  Hip trauma, fracture suspected, xray done EXAM: CT OF THE RIGHT HIP WITHOUT CONTRAST TECHNIQUE: Multidetector CT imaging of the right hip was performed according to the standard protocol. Multiplanar CT image reconstructions were also generated. RADIATION DOSE REDUCTION: This exam was performed according to the departmental dose-optimization program which includes automated exposure control, adjustment of the mA and/or kV according to patient size and/or use of iterative reconstruction technique. COMPARISON:  X-ray right hip 06/19/2022 FINDINGS: Bones/Joint/Cartilage Total right hip arthroplasty. Full shaft width inferior right pubic rami fracture. Associated nondisplaced right posterior acetabulum fracture (3:55). Suggestion of an acute nondisplaced anterior right acetabular fracture (9:56). Ligaments Suboptimally assessed by CT. Muscles and Tendons Grossly unremarkable. Soft tissues Gluteal coarsened calcifications along the ischial tuberosities and lower medial gluteal soft tissues. Foci of subcutaneus soft tissue  emphysema along the anterior right pelvis as well as multiple foci of emphysema along the right proximal medial thigh of unclear etiology. Other: Lymph node dissection vascular clips throughout the pelvis. IMPRESSION: 1. Full shaft width inferior right pubic rami fracture. Associated nondisplaced right posterior acetabulum fracture. Suggestion of an acute nondisplaced anterior right acetabular fracture 2. Total right hip arthroplasty. No CT finding to suggest surgical hardware complication. 3. Foci of subcutaneus soft tissue emphysema along the anterior right pelvis as well as multiple foci of emphysema along the right proximal medial thigh of unclear etiology. Finding may be related to infection versus trauma more distally. Correlate with physical  exam. Electronically Signed   By: Iven Finn M.D.   On: 06/19/2022 22:06   CT Head Wo Contrast  Result Date: 06/19/2022 CLINICAL DATA:  Polytrauma, blunt.  Mechanical fall. EXAM: CT HEAD WITHOUT CONTRAST CT CERVICAL SPINE WITHOUT CONTRAST TECHNIQUE: Multidetector CT imaging of the head and cervical spine was performed following the standard protocol without intravenous contrast. Multiplanar CT image reconstructions of the cervical spine were also generated. RADIATION DOSE REDUCTION: This exam was performed according to the departmental dose-optimization program which includes automated exposure control, adjustment of the mA and/or kV according to patient size and/or use of iterative reconstruction technique. COMPARISON:  08/13/2021, 10/27/2003. FINDINGS: CT HEAD FINDINGS Brain: No acute intracranial hemorrhage, midline shift or mass effect. No extra-axial fluid collection. Periventricular white matter hypodensities are present bilaterally. No hydrocephalus. Vascular: No hyperdense vessel or unexpected calcification. Skull: Normal. Negative for fracture or focal lesion. Sinuses/Orbits: Small opacity is noted in the ethmoid air cells on the right. No acute orbital  abnormality. Other: Small subcutaneous fat stranding over the occipital bone on the right, possible contusion. CT CERVICAL SPINE FINDINGS Alignment: There is mild anterolisthesis at C3-C4, C4-C5, and C7-T1. Skull base and vertebrae: No acute fracture. No primary bone lesion or focal pathologic process. Soft tissues and spinal canal: No prevertebral fluid or swelling. No visible canal hematoma. Disc levels: Multilevel intervertebral disc space narrowing, uncovertebral osteophyte formation and facet arthropathy, most pronounced at C5-C6 and C6-C7. Upper chest: No acute abnormality. Other: None. IMPRESSION: 1. No acute intracranial process. 2. Chronic microvascular ischemic changes. 3. Multilevel degenerative changes in the cervical spine without evidence of acute fracture. Electronically Signed   By: Brett Fairy M.D.   On: 06/19/2022 20:29   CT Cervical Spine Wo Contrast  Result Date: 06/19/2022 CLINICAL DATA:  Polytrauma, blunt.  Mechanical fall. EXAM: CT HEAD WITHOUT CONTRAST CT CERVICAL SPINE WITHOUT CONTRAST TECHNIQUE: Multidetector CT imaging of the head and cervical spine was performed following the standard protocol without intravenous contrast. Multiplanar CT image reconstructions of the cervical spine were also generated. RADIATION DOSE REDUCTION: This exam was performed according to the departmental dose-optimization program which includes automated exposure control, adjustment of the mA and/or kV according to patient size and/or use of iterative reconstruction technique. COMPARISON:  08/13/2021, 10/27/2003. FINDINGS: CT HEAD FINDINGS Brain: No acute intracranial hemorrhage, midline shift or mass effect. No extra-axial fluid collection. Periventricular white matter hypodensities are present bilaterally. No hydrocephalus. Vascular: No hyperdense vessel or unexpected calcification. Skull: Normal. Negative for fracture or focal lesion. Sinuses/Orbits: Small opacity is noted in the ethmoid air cells on the  right. No acute orbital abnormality. Other: Small subcutaneous fat stranding over the occipital bone on the right, possible contusion. CT CERVICAL SPINE FINDINGS Alignment: There is mild anterolisthesis at C3-C4, C4-C5, and C7-T1. Skull base and vertebrae: No acute fracture. No primary bone lesion or focal pathologic process. Soft tissues and spinal canal: No prevertebral fluid or swelling. No visible canal hematoma. Disc levels: Multilevel intervertebral disc space narrowing, uncovertebral osteophyte formation and facet arthropathy, most pronounced at C5-C6 and C6-C7. Upper chest: No acute abnormality. Other: None. IMPRESSION: 1. No acute intracranial process. 2. Chronic microvascular ischemic changes. 3. Multilevel degenerative changes in the cervical spine without evidence of acute fracture. Electronically Signed   By: Brett Fairy M.D.   On: 06/19/2022 20:29   DG Chest Portable 1 View  Result Date: 06/19/2022 CLINICAL DATA:  Status post fall EXAM: PORTABLE CHEST 1 VIEW COMPARISON:  01/10/2022 FINDINGS: Cardiac shadow is within  normal limits. Left basilar atelectasis is noted. Lungs are otherwise clear. No bony abnormality is noted. IMPRESSION: Mild left basilar atelectasis. Electronically Signed   By: Inez Catalina M.D.   On: 06/19/2022 20:02   DG HIP UNILAT WITH PELVIS 2-3 VIEWS RIGHT  Result Date: 06/19/2022 CLINICAL DATA:  Status post recent fall with right hip pain, initial encounter EXAM: DG HIP (WITH OR WITHOUT PELVIS) 3V RIGHT COMPARISON:  06/06/2017 FINDINGS: Dystrophic calcifications are again identified about the hip joints and within the gluteal soft tissues. Right hip replacement is again noted and appears well seated. No acute fracture is noted. No other focal abnormality is seen. IMPRESSION: No acute fracture noted. Evidence of prosthetic dislocation. Chronic dystrophic calcifications about the hip joints. Electronically Signed   By: Inez Catalina M.D.   On: 06/19/2022 19:59    Pending  Labs Unresulted Labs (From admission, onward)     Start     Ordered   06/19/22 1917  Urinalysis, Routine w reflex microscopic -Urine, Clean Catch  Once,   URGENT       Question:  Specimen Source  Answer:  Urine, Clean Catch   06/19/22 1917            Vitals/Pain Today's Vitals   06/19/22 2230 06/19/22 2233 06/19/22 2245 06/19/22 2300  BP: 111/65  112/73 103/64  Pulse: 65  63 62  Resp: 16  16 13   Temp:      TempSrc:      SpO2: 98%  98% 98%  PainSc:  4       Isolation Precautions No active isolations  Medications Medications  fentaNYL (SUBLIMAZE) injection 50 mcg (50 mcg Intravenous Given 06/19/22 1932)  diazepam (VALIUM) injection 2.5 mg (2.5 mg Intravenous Given 06/19/22 1945)  lidocaine-EPINEPHrine (XYLOCAINE W/EPI) 2 %-1:200000 (PF) injection 10 mL (10 mLs Infiltration Given 06/19/22 2156)  Tdap (BOOSTRIX) injection 0.5 mL (0.5 mLs Intramuscular Given 06/19/22 2155)  fentaNYL (SUBLIMAZE) injection 50 mcg (50 mcg Intravenous Given 06/19/22 2155)    Mobility walks with person assist

## 2022-06-20 DIAGNOSIS — S32592A Other specified fracture of left pubis, initial encounter for closed fracture: Secondary | ICD-10-CM | POA: Diagnosis not present

## 2022-06-20 DIAGNOSIS — Z23 Encounter for immunization: Secondary | ICD-10-CM | POA: Diagnosis not present

## 2022-06-20 DIAGNOSIS — G35 Multiple sclerosis: Secondary | ICD-10-CM | POA: Diagnosis present

## 2022-06-20 DIAGNOSIS — S32401D Unspecified fracture of right acetabulum, subsequent encounter for fracture with routine healing: Secondary | ICD-10-CM | POA: Diagnosis present

## 2022-06-20 DIAGNOSIS — Y92009 Unspecified place in unspecified non-institutional (private) residence as the place of occurrence of the external cause: Secondary | ICD-10-CM | POA: Diagnosis not present

## 2022-06-20 DIAGNOSIS — S32501A Unspecified fracture of right pubis, initial encounter for closed fracture: Secondary | ICD-10-CM | POA: Diagnosis not present

## 2022-06-20 DIAGNOSIS — D509 Iron deficiency anemia, unspecified: Secondary | ICD-10-CM | POA: Diagnosis present

## 2022-06-20 DIAGNOSIS — K219 Gastro-esophageal reflux disease without esophagitis: Secondary | ICD-10-CM

## 2022-06-20 DIAGNOSIS — R102 Pelvic and perineal pain: Secondary | ICD-10-CM | POA: Diagnosis not present

## 2022-06-20 DIAGNOSIS — M810 Age-related osteoporosis without current pathological fracture: Secondary | ICD-10-CM | POA: Diagnosis present

## 2022-06-20 DIAGNOSIS — E871 Hypo-osmolality and hyponatremia: Secondary | ICD-10-CM | POA: Diagnosis present

## 2022-06-20 DIAGNOSIS — F32A Depression, unspecified: Secondary | ICD-10-CM | POA: Diagnosis present

## 2022-06-20 DIAGNOSIS — S32409D Unspecified fracture of unspecified acetabulum, subsequent encounter for fracture with routine healing: Secondary | ICD-10-CM | POA: Diagnosis not present

## 2022-06-20 DIAGNOSIS — S0101XA Laceration without foreign body of scalp, initial encounter: Secondary | ICD-10-CM | POA: Diagnosis present

## 2022-06-20 DIAGNOSIS — K5909 Other constipation: Secondary | ICD-10-CM | POA: Diagnosis present

## 2022-06-20 DIAGNOSIS — S32591D Other specified fracture of right pubis, subsequent encounter for fracture with routine healing: Secondary | ICD-10-CM | POA: Diagnosis not present

## 2022-06-20 DIAGNOSIS — G4700 Insomnia, unspecified: Secondary | ICD-10-CM | POA: Diagnosis present

## 2022-06-20 DIAGNOSIS — S32491A Other specified fracture of right acetabulum, initial encounter for closed fracture: Secondary | ICD-10-CM | POA: Diagnosis present

## 2022-06-20 DIAGNOSIS — F909 Attention-deficit hyperactivity disorder, unspecified type: Secondary | ICD-10-CM | POA: Diagnosis present

## 2022-06-20 DIAGNOSIS — K5900 Constipation, unspecified: Secondary | ICD-10-CM | POA: Diagnosis not present

## 2022-06-20 DIAGNOSIS — Z8249 Family history of ischemic heart disease and other diseases of the circulatory system: Secondary | ICD-10-CM | POA: Diagnosis not present

## 2022-06-20 DIAGNOSIS — E039 Hypothyroidism, unspecified: Secondary | ICD-10-CM | POA: Diagnosis present

## 2022-06-20 DIAGNOSIS — Z96641 Presence of right artificial hip joint: Secondary | ICD-10-CM | POA: Diagnosis present

## 2022-06-20 DIAGNOSIS — J189 Pneumonia, unspecified organism: Secondary | ICD-10-CM | POA: Diagnosis not present

## 2022-06-20 DIAGNOSIS — Z79890 Hormone replacement therapy (postmenopausal): Secondary | ICD-10-CM | POA: Diagnosis not present

## 2022-06-20 DIAGNOSIS — S32401A Unspecified fracture of right acetabulum, initial encounter for closed fracture: Secondary | ICD-10-CM | POA: Diagnosis not present

## 2022-06-20 DIAGNOSIS — Z800 Family history of malignant neoplasm of digestive organs: Secondary | ICD-10-CM | POA: Diagnosis not present

## 2022-06-20 DIAGNOSIS — E038 Other specified hypothyroidism: Secondary | ICD-10-CM | POA: Diagnosis not present

## 2022-06-20 DIAGNOSIS — J45991 Cough variant asthma: Secondary | ICD-10-CM | POA: Diagnosis present

## 2022-06-20 DIAGNOSIS — Z87440 Personal history of urinary (tract) infections: Secondary | ICD-10-CM | POA: Diagnosis not present

## 2022-06-20 DIAGNOSIS — R498 Other voice and resonance disorders: Secondary | ICD-10-CM | POA: Diagnosis present

## 2022-06-20 DIAGNOSIS — E876 Hypokalemia: Secondary | ICD-10-CM | POA: Diagnosis present

## 2022-06-20 DIAGNOSIS — Z8541 Personal history of malignant neoplasm of cervix uteri: Secondary | ICD-10-CM | POA: Diagnosis not present

## 2022-06-20 DIAGNOSIS — J4489 Other specified chronic obstructive pulmonary disease: Secondary | ICD-10-CM | POA: Diagnosis present

## 2022-06-20 DIAGNOSIS — S32591A Other specified fracture of right pubis, initial encounter for closed fracture: Secondary | ICD-10-CM | POA: Diagnosis present

## 2022-06-20 DIAGNOSIS — D508 Other iron deficiency anemias: Secondary | ICD-10-CM | POA: Diagnosis present

## 2022-06-20 DIAGNOSIS — M1990 Unspecified osteoarthritis, unspecified site: Secondary | ICD-10-CM | POA: Diagnosis present

## 2022-06-20 DIAGNOSIS — W01198A Fall on same level from slipping, tripping and stumbling with subsequent striking against other object, initial encounter: Secondary | ICD-10-CM | POA: Diagnosis present

## 2022-06-20 DIAGNOSIS — J440 Chronic obstructive pulmonary disease with acute lower respiratory infection: Secondary | ICD-10-CM | POA: Diagnosis not present

## 2022-06-20 DIAGNOSIS — J9811 Atelectasis: Secondary | ICD-10-CM | POA: Diagnosis present

## 2022-06-20 DIAGNOSIS — W19XXXA Unspecified fall, initial encounter: Secondary | ICD-10-CM | POA: Diagnosis not present

## 2022-06-20 DIAGNOSIS — T84020A Dislocation of internal right hip prosthesis, initial encounter: Secondary | ICD-10-CM | POA: Diagnosis present

## 2022-06-20 DIAGNOSIS — M25551 Pain in right hip: Secondary | ICD-10-CM | POA: Diagnosis present

## 2022-06-20 DIAGNOSIS — S32592D Other specified fracture of left pubis, subsequent encounter for fracture with routine healing: Secondary | ICD-10-CM | POA: Diagnosis not present

## 2022-06-20 DIAGNOSIS — W1830XD Fall on same level, unspecified, subsequent encounter: Secondary | ICD-10-CM | POA: Diagnosis not present

## 2022-06-20 DIAGNOSIS — Z90710 Acquired absence of both cervix and uterus: Secondary | ICD-10-CM | POA: Diagnosis not present

## 2022-06-20 DIAGNOSIS — R338 Other retention of urine: Secondary | ICD-10-CM | POA: Diagnosis present

## 2022-06-20 DIAGNOSIS — Z820 Family history of epilepsy and other diseases of the nervous system: Secondary | ICD-10-CM | POA: Diagnosis not present

## 2022-06-20 LAB — CBC
HCT: 35.3 % — ABNORMAL LOW (ref 36.0–46.0)
Hemoglobin: 11.1 g/dL — ABNORMAL LOW (ref 12.0–15.0)
MCH: 28.8 pg (ref 26.0–34.0)
MCHC: 31.4 g/dL (ref 30.0–36.0)
MCV: 91.5 fL (ref 80.0–100.0)
Platelets: 256 10*3/uL (ref 150–400)
RBC: 3.86 MIL/uL — ABNORMAL LOW (ref 3.87–5.11)
RDW: 15.9 % — ABNORMAL HIGH (ref 11.5–15.5)
WBC: 9.4 10*3/uL (ref 4.0–10.5)
nRBC: 0 % (ref 0.0–0.2)

## 2022-06-20 LAB — COMPREHENSIVE METABOLIC PANEL
ALT: 19 U/L (ref 0–44)
AST: 31 U/L (ref 15–41)
Albumin: 3.6 g/dL (ref 3.5–5.0)
Alkaline Phosphatase: 60 U/L (ref 38–126)
Anion gap: 7 (ref 5–15)
BUN: 19 mg/dL (ref 8–23)
CO2: 26 mmol/L (ref 22–32)
Calcium: 8.8 mg/dL — ABNORMAL LOW (ref 8.9–10.3)
Chloride: 97 mmol/L — ABNORMAL LOW (ref 98–111)
Creatinine, Ser: 0.78 mg/dL (ref 0.44–1.00)
GFR, Estimated: >60 mL/min (ref >60)
Glucose, Bld: 113 mg/dL — ABNORMAL HIGH (ref 70–99)
Potassium: 4.1 mmol/L (ref 3.5–5.1)
Sodium: 130 mmol/L — ABNORMAL LOW (ref 135–145)
Total Bilirubin: 0.3 mg/dL (ref 0.3–1.2)
Total Protein: 6.7 g/dL (ref 6.5–8.1)

## 2022-06-20 LAB — HIV ANTIBODY (ROUTINE TESTING W REFLEX): HIV Screen 4th Generation wRfx: NONREACTIVE

## 2022-06-20 LAB — MAGNESIUM: Magnesium: 2.2 mg/dL (ref 1.7–2.4)

## 2022-06-20 MED ORDER — METHENAMINE MANDELATE 0.5 G PO TABS
1000.0000 mg | ORAL_TABLET | Freq: Two times a day (BID) | ORAL | Status: DC
  Administered 2022-06-20 – 2022-06-22 (×5): 1000 mg via ORAL
  Filled 2022-06-20 (×5): qty 2

## 2022-06-20 MED ORDER — CALCIUM CARBONATE 1250 (500 CA) MG PO TABS
1.0000 | ORAL_TABLET | Freq: Every day | ORAL | Status: DC
  Administered 2022-06-21 – 2022-06-22 (×2): 1250 mg via ORAL
  Filled 2022-06-20 (×2): qty 1

## 2022-06-20 MED ORDER — ENOXAPARIN SODIUM 30 MG/0.3ML IJ SOSY
30.0000 mg | PREFILLED_SYRINGE | INTRAMUSCULAR | Status: DC
  Administered 2022-06-20 – 2022-06-21 (×2): 30 mg via SUBCUTANEOUS
  Filled 2022-06-20 (×2): qty 0.3

## 2022-06-20 MED ORDER — FENTANYL 25 MCG/HR TD PT72
1.0000 | MEDICATED_PATCH | TRANSDERMAL | Status: DC
  Administered 2022-06-20: 1 via TRANSDERMAL
  Filled 2022-06-20: qty 1

## 2022-06-20 MED ORDER — KETOROLAC TROMETHAMINE 15 MG/ML IJ SOLN
15.0000 mg | Freq: Three times a day (TID) | INTRAMUSCULAR | Status: DC | PRN
  Administered 2022-06-20 – 2022-06-22 (×4): 15 mg via INTRAVENOUS
  Filled 2022-06-20 (×4): qty 1

## 2022-06-20 MED ORDER — DOCUSATE SODIUM 100 MG PO CAPS
100.0000 mg | ORAL_CAPSULE | Freq: Every day | ORAL | Status: DC | PRN
  Administered 2022-06-20: 100 mg via ORAL
  Filled 2022-06-20: qty 1

## 2022-06-20 MED ORDER — FERROUS SULFATE 325 (65 FE) MG PO TABS
325.0000 mg | ORAL_TABLET | Freq: Every day | ORAL | Status: DC
  Administered 2022-06-20 – 2022-06-21 (×2): 325 mg via ORAL
  Filled 2022-06-20 (×2): qty 1

## 2022-06-20 MED ORDER — LIDOCAINE 5 % EX PTCH
1.0000 | MEDICATED_PATCH | CUTANEOUS | Status: DC
  Administered 2022-06-20 – 2022-06-21 (×2): 1 via TRANSDERMAL
  Filled 2022-06-20 (×2): qty 1

## 2022-06-20 NOTE — Consult Note (Signed)
Reason for Consult:right hip per-prosthetic acetabular fractures Referring Physician: Reesa Chew, MD  Kayla [name] is an 72 y.o. female.  HPI: Patient states she has Kayla and normally uses a walker to ambulate but today she had to make a short trip to the kitchen and did not have her walker with her when she lost her balance and fell on her right side. She reports hitting her head on the floor but denies loss of consciousness. Reporting right-sided hip/pelvic pain. She does not take any blood thinners. She reports chronic shortness of breath and cough due to vocal cord dysfunction for which she is seen by pulmonology. She does not think her respiratory symptoms are any worse from her baseline. Denies fevers, chest pain, nausea, vomiting, abdominal pain, or diarrhea. She reports chronic constipation for which she takes Colace at home.   Kayla [name] is well known to me thus I was asked to see her and comment on her radiographic findings  Head and right hip are her only complaints  She states that she had been making some good progress recently working with PT twice a week  Past Medical History:  Diagnosis Date   Arthritis    oa   Cervical cancer ([employer]) 2005   COPD (chronic obstructive pulmonary disease) ([place])    Multiple sclerosis ([place])    Multiple sclerosis ([place]) 1996   Neuromuscular disorder ([place])    Osteoporosis    Other diseases of vocal cords    vocal cords are not even   Other voice and resonance disorders    Pneumonia 2014, 1992   Sleep apnea    no cpap used   Unspecified hypothyroidism     Past Surgical History:  Procedure Laterality Date   ABDOMINAL HYSTERECTOMY  03/25/2003   for cervical cancer, complete   BRONCHIAL BIOPSY  10/26/2020   Procedure: BRONCHIAL BIOPSIES;  Surgeon: Garner Nash, DO;  Location: [facility];  Service: Pulmonary;;   BRONCHIAL BRUSHINGS  10/26/2020   Procedure: BRONCHIAL BRUSHINGS;  Surgeon: Garner Nash, DO;  Location: [facility];  Service:  Pulmonary;;   BRONCHIAL WASHINGS  10/26/2020   Procedure: BRONCHIAL WASHINGS;  Surgeon: Garner Nash, DO;  Location: [facility];  Service: Pulmonary;;   CATARACT EXTRACTION, BILATERAL Bilateral    lens for cataracts   CONVERSION TO TOTAL HIP Right 09/18/2015   Procedure: CONVERSION OF RIGHT HEMI ARTHROPLASTY TO TOTAL HIP ARTHROPLASTY ACETABULAR REVISON ;  Surgeon: Paralee Cancel, MD;  Location: [facility];  Service: Orthopedics;  Laterality: Right;   HIP ARTHROPLASTY Right 03/01/2013   Procedure: ARTHROPLASTY BIPOLAR HIP;  Surgeon: Mauri Pole, MD;  Location: [facility];  Service: Orthopedics;  Laterality: Right;   HIP CLOSED REDUCTION Right 03/12/2013   Procedure: CLOSED REDUCTION HIP;  Surgeon: Gearlean Alf, MD;  Location: [facility];  Service: Orthopedics;  Laterality: Right;   HIP CLOSED REDUCTION Right 03/19/2013   Procedure: CLOSED REDUCTION HIP;  Surgeon: Johnn Hai, MD;  Location: [facility];  Service: Orthopedics;  Laterality: Right;   HIP CLOSED REDUCTION Right 11/10/2015   Procedure: CLOSED MANIPULATION HIP;  Surgeon: Rod Can, MD;  Location: [facility];  Service: Orthopedics;  Laterality: Right;   HIP CLOSED REDUCTION Right 06/05/2017   Procedure: CLOSED REDUCTION HIP;  Surgeon: [name], MD;  Location: [facility];  Service: Orthopedics;  Laterality: Right;   SMALL INTESTINE SURGERY     TOTAL HIP REVISION Right 06/06/2017   Procedure: TOTAL HIP REVISION;  Surgeon: [name], MD;  Location:  [facility];  Service: Orthopedics;  Laterality: Right;   VESICOVAGINAL FISTULA CLOSURE W/ TAH  03/25/2003   VIDEO BRONCHOSCOPY N/A 10/26/2020   Procedure: VIDEO BRONCHOSCOPY WITH FLUORO;  Surgeon: Garner Nash, DO;  Location: [facility];  Service: Pulmonary;  Laterality: N/A;    Family History  Problem Relation Age of Onset   Other Mother        COVID   Colon cancer Father    Atrial fibrillation Brother    Heart attack Maternal Grandfather    Parkinson's disease Paternal  Grandfather    Esophageal cancer Neg Hx    Stomach cancer Neg Hx    Rectal cancer Neg Hx     Social History:  reports that she has never smoked. She has never used smokeless tobacco. She reports current alcohol use of about 2.0 standard drinks of alcohol per week. She reports that she does not use drugs.  Allergies:  Allergies  Allergen Reactions   Tramadol     hallucinations    Contrast Media [Iodinated Contrast Media] Rash   Iohexol Hives   Morphine And Related Other (See Comments)    Reaction:  Hallucinations    Methylprednisolone Other (See Comments)    Reaction:  Decreases pts heart rate    Hydrocodone Itching   Oxycodone-Acetaminophen Rash and Other (See Comments)    Reaction:  Hallucinations    Phenytoin Sodium Extended Rash   Zanaflex [Tizanidine Hydrochloride] Rash    Medications: I have reviewed the patient's current medications. Scheduled:  amphetamine-dextroamphetamine  20 mg Oral Daily   baclofen  20 mg Oral Daily   famotidine  20 mg Oral QPC supper   ferrous sulfate  325 mg Oral Q supper   FLUoxetine  40 mg Oral Daily   levothyroxine  100 mcg Oral Q0600   methenamine  1,000 mg Oral BID WC   mirabegron ER  25 mg Oral Daily   pantoprazole  40 mg Oral Daily   tamsulosin  0.4 mg Oral Daily   temazepam  15 mg Oral QHS   trimethoprim  100 mg Oral Daily    Results for orders placed or performed during the hospital encounter of 06/19/22 (from the past 24 hour(s))  Basic metabolic panel     Status: Abnormal   Collection Time: 06/19/22  7:21 PM  Result Value Ref Range   Sodium 136 135 - 145 mmol/L   Potassium 3.2 (L) 3.5 - 5.1 mmol/L   Chloride 109 98 - 111 mmol/L   CO2 22 22 - 32 mmol/L   Glucose, Bld 85 70 - 99 mg/dL   BUN 18 8 - 23 mg/dL   Creatinine, Ser 0.61 0.44 - 1.00 mg/dL   Calcium 6.8 (L) 8.9 - 10.3 mg/dL   GFR, Estimated >60 >60 mL/min   Anion gap 5 5 - 15  CBC with Differential     Status: Abnormal   Collection Time: 06/19/22  7:21 PM  Result  Value Ref Range   WBC 9.1 4.0 - 10.5 K/uL   RBC 3.79 (L) 3.87 - 5.11 MIL/uL   Hemoglobin 11.0 (L) 12.0 - 15.0 g/dL   HCT 34.7 (L) 36.0 - 46.0 %   MCV 91.6 80.0 - 100.0 fL   MCH 29.0 26.0 - 34.0 pg   MCHC 31.7 30.0 - 36.0 g/dL   RDW 15.7 (H) 11.5 - 15.5 %   Platelets 268 150 - 400 K/uL   nRBC 0.0 0.0 - 0.2 %   Neutrophils Relative % 79 %  Neutro Abs 7.2 1.7 - 7.7 K/uL   Lymphocytes Relative 13 %   Lymphs Abs 1.2 0.7 - 4.0 K/uL   Monocytes Relative 6 %   Monocytes Absolute 0.6 0.1 - 1.0 K/uL   Eosinophils Relative 0 %   Eosinophils Absolute 0.0 0.0 - 0.5 K/uL   Basophils Relative 1 %   Basophils Absolute 0.1 0.0 - 0.1 K/uL   Immature Granulocytes 1 %   Abs Immature Granulocytes 0.09 (H) 0.00 - 0.07 K/uL  CBG monitoring, ED     Status: Abnormal   Collection Time: 06/19/22 11:57 PM  Result Value Ref Range   Glucose-Capillary 112 (H) 70 - 99 mg/dL   Comment 1 Notify RN   CBC     Status: Abnormal   Collection Time: 06/20/22  1:22 AM  Result Value Ref Range   WBC 9.4 4.0 - 10.5 K/uL   RBC 3.86 (L) 3.87 - 5.11 MIL/uL   Hemoglobin 11.1 (L) 12.0 - 15.0 g/dL   HCT 35.3 (L) 36.0 - 46.0 %   MCV 91.5 80.0 - 100.0 fL   MCH 28.8 26.0 - 34.0 pg   MCHC 31.4 30.0 - 36.0 g/dL   RDW 15.9 (H) 11.5 - 15.5 %   Platelets 256 150 - 400 K/uL   nRBC 0.0 0.0 - 0.2 %  Comprehensive metabolic panel     Status: Abnormal   Collection Time: 06/20/22  1:22 AM  Result Value Ref Range   Sodium 130 (L) 135 - 145 mmol/L   Potassium 4.1 3.5 - 5.1 mmol/L   Chloride 97 (L) 98 - 111 mmol/L   CO2 26 22 - 32 mmol/L   Glucose, Bld 113 (H) 70 - 99 mg/dL   BUN 19 8 - 23 mg/dL   Creatinine, Ser 0.78 0.44 - 1.00 mg/dL   Calcium 8.8 (L) 8.9 - 10.3 mg/dL   Total Protein 6.7 6.5 - 8.1 g/dL   Albumin 3.6 3.5 - 5.0 g/dL   AST 31 15 - 41 U/L   ALT 19 0 - 44 U/L   Alkaline Phosphatase 60 38 - 126 U/L   Total Bilirubin 0.3 0.3 - 1.2 mg/dL   GFR, Estimated >60 >60 mL/min   Anion gap 7 5 - 15  Magnesium     Status:  None   Collection Time: 06/20/22  1:22 AM  Result Value Ref Range   Magnesium 2.2 1.7 - 2.4 mg/dL     X-ray: CLINICAL DATA:  Status post recent fall with right hip pain, initial encounter   EXAM: DG HIP (WITH OR WITHOUT PELVIS) 3V RIGHT   COMPARISON:  06/06/2017   FINDINGS: Dystrophic calcifications are again identified about the hip joints and within the gluteal soft tissues. Right hip replacement is again noted and appears well seated. No acute fracture is noted. No other focal abnormality is seen.   IMPRESSION: No acute fracture noted.   Evidence of prosthetic dislocation.   Chronic dystrophic calcifications about the hip joints.     Electronically Signed   By: Inez Catalina M.D.  CLINICAL DATA:  Hip trauma, fracture suspected, xray done   EXAM: CT OF THE RIGHT HIP WITHOUT CONTRAST   TECHNIQUE: Multidetector CT imaging of the right hip was performed according to the standard protocol. Multiplanar CT image reconstructions were also generated.   RADIATION DOSE REDUCTION: This exam was performed according to the departmental dose-optimization program which includes automated exposure control, adjustment of the mA and/or kV according to patient size and/or use  of iterative reconstruction technique.   COMPARISON:  X-ray right hip 06/19/2022   FINDINGS: Bones/Joint/Cartilage   Total right hip arthroplasty. Full shaft width inferior right pubic rami fracture. Associated nondisplaced right posterior acetabulum fracture (3:55). Suggestion of an acute nondisplaced anterior right acetabular fracture (9:56).   Ligaments   Suboptimally assessed by CT.   Muscles and Tendons   Grossly unremarkable.   Soft tissues   Gluteal coarsened calcifications along the ischial tuberosities and lower medial gluteal soft tissues. Foci of subcutaneus soft tissue emphysema along the anterior right pelvis as well as multiple foci of emphysema along the right proximal medial  thigh of unclear etiology.   Other: Lymph node dissection vascular clips throughout the pelvis.   IMPRESSION: 1. Full shaft width inferior right pubic rami fracture. Associated nondisplaced right posterior acetabulum fracture. Suggestion of an acute nondisplaced anterior right acetabular fracture 2. Total right hip arthroplasty. No CT finding to suggest surgical hardware complication. 3. Foci of subcutaneus soft tissue emphysema along the anterior right pelvis as well as multiple foci of emphysema along the right proximal medial thigh of unclear etiology. Finding may be related to infection versus trauma more distally. Correlate with physical exam.     Electronically Signed   By: Iven Finn M.D.  ROS: AS per admitting HPI  Blood pressure (!) 105/50, pulse 65, temperature 97.7 F (36.5 C), temperature source Oral, resp. rate 16, SpO2 90 %.  Physical Exam: Constitutional:      General: She is not in acute distress. HENT:     Head: Normocephalic and atraumatic.  Eyes:     Extraocular Movements: Extraocular movements intact.  Cardiovascular:     Rate and Rhythm: Normal rate and regular rhythm.     Pulses: Normal pulses.  Pulmonary:     Effort: Pulmonary effort is normal. No respiratory distress.     Breath sounds: No wheezing or rales.  Abdominal:     General: Bowel sounds are normal. There is no distension.     Palpations: Abdomen is soft.     Tenderness: There is no abdominal tenderness.  Musculoskeletal:     Cervical back: Normal range of motion.     Right lower leg: Edema present.     Left lower leg: Edema present.     Comments: Trace bilateral lower extremity edema  Skin:    General: Skin is warm and dry.  Neurological:     General: No focal deficit present.     Mental Status: She is alert and oriented to person, place, and time.   Assessment/Plan: History of revision right THR Right nondisplaced rami fractures that are in proximity to but do not directly  involve the acetabular component Femoral and acetabular components stable  Plan: I would suggest protected weight bearing of 50% through the RLE Follow up with me in 3 weeks for Xrays Otherwise gently pain control related to her chronic constipation issues  Mauri Pole 06/20/2022, 7:42 AM

## 2022-06-20 NOTE — Assessment & Plan Note (Signed)
Hemoglobin seems stable. -Continue home iron supplement 

## 2022-06-20 NOTE — Progress Notes (Signed)
ANTICOAGULATION CONSULT NOTE - Initial Consult  Pharmacy Consult for Lovenox Indication: VTE prophylaxis  Allergies  Allergen Reactions   Tramadol     hallucinations    Contrast Media [Iodinated Contrast Media] Rash   Iohexol Hives   Morphine And Related Other (See Comments)    Reaction:  Hallucinations    Methylprednisolone Other (See Comments)    Reaction:  Decreases pts heart rate    Hydrocodone Itching   Oxycodone-Acetaminophen Rash and Other (See Comments)    Reaction:  Hallucinations    Phenytoin Sodium Extended Rash   Zanaflex [Tizanidine Hydrochloride] Rash    Patient Measurements:   Heparin Dosing Weight:   Vital Signs: Temp: 98.6 F (37 C) (03/29 1400) Temp Source: Oral (03/29 1400) BP: 111/64 (03/29 1400) Pulse Rate: 66 (03/29 1400)  Labs: Recent Labs    06/19/22 1921 06/20/22 0122  HGB 11.0* 11.1*  HCT 34.7* 35.3*  PLT 268 256  CREATININE 0.61 0.78    CrCl cannot be calculated (Unknown ideal weight.).   Medical History: Past Medical History:  Diagnosis Date   Arthritis    oa   Cervical cancer ([place]) 2005   COPD (chronic obstructive pulmonary disease) ([place])    Multiple sclerosis ([place])    Multiple sclerosis ([place]) 1996   Neuromuscular disorder ([place])    Osteoporosis    Other diseases of vocal cords    vocal cords are not even   Other voice and resonance disorders    Pneumonia 2014, 1992   Sleep apnea    no cpap used   Unspecified hypothyroidism     Medications:  Medications Prior to Admission  Medication Sig Dispense Refill Last Dose   albuterol (VENTOLIN HFA) 108 (90 Base) MCG/ACT inhaler INHALE 2 PUFFS INTO LUNGS EVERY 6 HOURS AS NEEDED. (Patient taking differently: Inhale 2 puffs into the lungs every 6 (six) hours as needed for wheezing or shortness of breath.) 18 g 0 unknown   amphetamine-dextroamphetamine (ADDERALL) 20 MG tablet Take 1 tablet (20 mg total) by mouth daily. 30 tablet 0 06/19/2022   baclofen (LIORESAL) 20 MG tablet Take  20 mg by mouth in the morning.   06/19/2022   CALCIUM PO Take 1 tablet by mouth daily.   06/19/2022   dexlansoprazole (DEXILANT) 60 MG capsule TAKE 1 CAPSULE BY MOUTH  ONCE DAILY WITH BREAKFAST 90 capsule 0 06/19/2022   famotidine (PEPCID) 20 MG tablet One after supper (Patient taking differently: Take 20 mg by mouth at bedtime. One after supper) 30 tablet 11 06/18/2022   Ferric Maltol (ACCRUFER) 30 MG CAPS Take 1 capsule by mouth in the morning and at bedtime. 180 capsule 0 06/19/2022   FLUoxetine (PROZAC) 40 MG capsule Take 1 capsule (40 mg total) by mouth daily. 90 capsule 1 06/19/2022   Fluticasone-Umeclidin-Vilant (TRELEGY ELLIPTA) 100-62.5-25 MCG/ACT AEPB Inhale 1 puff into the lungs daily. 120 each 1 06/19/2022   furosemide (LASIX) 20 MG tablet TAKE 1 TABLET BY MOUTH DAILY 90 tablet 3 06/19/2022   levothyroxine (SYNTHROID) 100 MCG tablet TAKE 1 TABLET BY MOUTH ONCE  DAILY BEFORE BREAKFAST 90 tablet 3 06/19/2022   methenamine (HIPREX) 1 g tablet Take 1 tablet (1 g total) by mouth 2 (two) times daily with a meal. 180 tablet 1 06/19/2022   Multiple Vitamin (MULTIVITAMIN WITH MINERALS) TABS tablet Take 1 tablet by mouth daily.   06/19/2022   Probiotic Product (PROBIOTIC PO) Take 1 capsule by mouth daily. Harmony probiotic   06/19/2022   sodium chloride HYPERTONIC 3 %  nebulizer solution Take by nebulization 2 (two) times daily as needed for cough. 750 mL 1 unknown   tamsulosin (FLOMAX) 0.4 MG CAPS capsule Take 0.4 mg by mouth daily.   06/19/2022   temazepam (RESTORIL) 15 MG capsule Take 1 capsule (15 mg total) by mouth at bedtime. 30 capsule 1 06/18/2022   trimethoprim (TRIMPEX) 100 MG tablet Take 100 mg by mouth daily.   06/19/2022   Vibegron (GEMTESA) 75 MG TABS Take 1 tablet by mouth in the morning.   06/19/2022   Immune Globulin, Human,-klhw (XEMBIFY [place]) Inject into the skin.      Respiratory Therapy Supplies (FLUTTER) DEVI Use as directed 1 each 0     Assessment: 72 yo F with right pubic rami fracture.   Pharmacy consulted to dose Lovenox for VTE px.  Hg 11.1, PLT WNL SCr WNL Wt 48.1 kg  Goal of Therapy:  Anti-Xa level 0.6-1 units/ml 4hrs after LMWH dose given Monitor platelets by anticoagulation protocol: Yes   Plan:  Lovenox 30 mg sq q24 for VTE px Pharmacy to sign off  Eudelia Bunch, Pharm.D Use secure chat for questions 06/20/2022 2:07 PM

## 2022-06-20 NOTE — Hospital Course (Addendum)
Taken from H&P.  Kayla [name] is a 72 y.o. female with medical history significant of MS, hypothyroidism, cough variant asthma versus vocal cord dysfunction related to MS, chronic constipation, iron deficiency anemia, recurrent UTIs, cervical cancer, osteoporosis, sleep apnea, history of right hip replacement presented to the ED from home after a mechanical fall.  Patient reported hitting her head on the floor and also complained of right hip pain.   Patient states she has MS and normally uses a walker to ambulate but today she had to make a short trip to the kitchen and did not have her walker with her when she lost her balance and fell on her right side. She reports hitting her head on the floor but denies loss of consciousness.    Vital signs stable.  Labs significant for no leukocytosis, hemoglobin 11.0 (was 11.8 on 05/26/2022 and previously 12-13 on labs done 5-6 months ago), potassium 3.2, calcium 6.8 (albumin level not checked), UA pending.  Chest x-ray showing mild left basilar atelectasis.  X-ray of right hip/pelvis showing evidence of prosthetic dislocation but no acute fracture.  CT head/C-spine negative for acute finding.   CT of right hip without contrast showing: "IMPRESSION: 1. Full shaft width inferior right pubic rami fracture. Associated nondisplaced right posterior acetabulum fracture. Suggestion of an acute nondisplaced anterior right acetabular fracture 2. Total right hip arthroplasty. No CT finding to suggest surgical hardware complication. 3. Foci of subcutaneus soft tissue emphysema along the anterior right pelvis as well as multiple foci of emphysema along the right proximal medial thigh of unclear etiology. Finding may be related to infection versus trauma more distally.  3/29: Vitals stable.  Orthopedic surgery was consulted and they were recommending 50% weightbearing on right lower extremity with conservative management as she has right pubic rami fracture which does  not involve the prosthetic or acetabulum.  Labs with stable hemoglobin, mild hyponatremia where sodium decreased to 130. PT and OT are recommending CIR. Continue to have significant pain -adding Toradol and can convert fentanyl patch.  3/30: Vital stable.  Labs with stable sodium at 130.  Per patient fentanyl patch seems helping, lidocaine patch does not cause much relief.  Had a bed offer for CIR tomorrow.  3/31: Hemodynamically stable.  Having pain this morning.  Increasing the dose of fentanyl patch as this seems working.  Patient will continue with current medications and is being discharged to acute rehab for further management.  She will follow-up with her providers for further recommendations.

## 2022-06-20 NOTE — Evaluation (Signed)
Physical Therapy Evaluation Patient Details Name: Kayla [name] MRN: [id number] DOB: Oct 28, 1950 Today's Date: 06/20/2022  History of Present Illness  Kayla [name] is a 72 y.o. female pesented to the ED from home after a mechanical fall. Patient reported hitting her head on the floor and also complained of right hip pain. CT of pelvis full shaft width inferior right pubic rami fracture. Associated  nondisplaced right posterior acetabulum fracture. Suggestive of acute nondisplaced anterior right acetabular fracture. PHMx: MS, hypothyroidism, cough variant asthma versus vocal cord dysfunction related to MS, chronic constipation, iron deficiency anemia, recurrent UTIs, cervical cancer, osteoporosis, sleep apnea, history of right hip replacement 08/2015 (revision 2019), right humeral head fx.  Clinical Impression  Pt admitted with above diagnosis. Pt ambulated 8' with RW, distance limited by pain and fatigue. +2 max assist for supine to sit.  Pt currently with functional limitations due to the deficits listed below (see PT Problem List). Pt will benefit from acute skilled PT to increase their independence and safety with mobility to allow discharge.          Recommendations for follow up therapy are one component of a multi-disciplinary discharge planning process, led by the attending physician.  Recommendations may be updated based on patient status, additional functional criteria and insurance authorization.  Follow Up Recommendations       Assistance Recommended at Discharge Intermittent Supervision/Assistance  Patient can return home with the following  Assistance with cooking/housework;Assist for transportation;Help with stairs or ramp for entrance;A little help with bathing/dressing/bathroom;A little help with walking and/or transfers    Equipment Recommendations Wheelchair (measurements PT);Wheelchair cushion (measurements PT)  Recommendations for Other Services       Functional Status  Assessment Patient has had a recent decline in their functional status and demonstrates the ability to make significant improvements in function in a reasonable and predictable amount of time.     Precautions / Restrictions Precautions Precautions: Fall Precaution Comments: h/o R foot drop, uses bioness; denies other falls in past 6 months Restrictions Weight Bearing Restrictions: Yes RLE Weight Bearing: Partial weight bearing RLE Partial Weight Bearing Percentage or Pounds: 50%      Mobility  Bed Mobility Overal bed mobility: Needs Assistance Bed Mobility: Supine to Sit     Supine to sit: +2 for physical assistance, Max assist     General bed mobility comments: assist to raise trunk and pivot hips to EOB    Transfers Overall transfer level: Needs assistance Equipment used: Rolling walker (2 wheels) Transfers: Sit to/from Stand Sit to Stand: Mod assist, From elevated surface           General transfer comment: VCs hand placement    Ambulation/Gait Ambulation/Gait assistance: Min guard Gait Distance (Feet): 8 Feet Assistive device: Rolling walker (2 wheels) Gait Pattern/deviations: Step-to pattern, Wide base of support, Decreased step length - right, Decreased step length - left Gait velocity: decr     General Gait Details: VCs for 50% PWB and to widen base of support, distance limited by pain/fatigue  Stairs            Wheelchair Mobility    Modified Rankin (Stroke Patients Only)       Balance Overall balance assessment: Needs assistance, History of Falls Sitting-balance support: Feet supported, No upper extremity supported Sitting balance-Leahy Scale: Fair     Standing balance support: Bilateral upper extremity supported Standing balance-Leahy Scale: Poor  Pertinent Vitals/Pain Pain Assessment Pain Assessment: 0-10 Pain Score: 8  Pain Location: R hip Pain Descriptors / Indicators: Sore Pain  Intervention(s): Limited activity within patient's tolerance, Monitored during session, Repositioned, Ice applied    Home Living Family/patient expects to be discharged to:: Private residence Living Arrangements: Spouse/significant other Available Help at Discharge: Family;Available 24 hours/day Type of Home: House Home Access: Stairs to enter Entrance Stairs-Rails: Psychiatric nurse of Steps: 4   Home Layout: One level Home Equipment: Conservation officer, nature (2 wheels);Shower seat;Grab bars - tub/shower Additional Comments: uses bioness for R foot drop    Prior Function Prior Level of Function : Driving;Independent/Modified Independent             Mobility Comments: walks with RW, no other falls in past 6 months ADLs Comments: self catheterizes, works part TEFL teacher for a [employer]        Extremity/Trunk Assessment   Upper Extremity Assessment Upper Extremity Assessment: Defer to OT evaluation    Lower Extremity Assessment Lower Extremity Assessment: RLE deficits/detail RLE Deficits / Details: knee ext -3/5, ankle DF -4/5, ankle PF +3/5 (pt wears bioness) RLE Sensation: WNL    Cervical / Trunk Assessment Cervical / Trunk Assessment: Normal  Communication   Communication: Expressive difficulties  Cognition Arousal/Alertness: Awake/alert Behavior During Therapy: WFL for tasks assessed/performed Overall Cognitive Status: Within Functional Limits for tasks assessed                                          General Comments      Exercises General Exercises - Lower Extremity Ankle Circles/Pumps: AROM, Both, 10 reps Long Arc Quad: AROM, Right, 5 reps, Seated Heel Slides: AAROM, Right, 10 reps   Assessment/Plan    PT Assessment Patient needs continued PT services  PT Problem List Decreased strength;Decreased balance;Decreased mobility;Decreased activity tolerance;Pain       PT Treatment  Interventions Gait training;Therapeutic activities;Functional mobility training;Patient/family education;Therapeutic exercise    PT Goals (Current goals can be found in the Care Plan section)  Acute Rehab PT Goals Patient Stated Goal: to get stronger PT Goal Formulation: With patient/family Time For Goal Achievement: 06/27/22 Potential to Achieve Goals: Good    Frequency Min 4X/week     Co-evaluation PT/OT/SLP Co-Evaluation/Treatment: Yes Reason for Co-Treatment: Complexity of the patient's impairments (multi-system involvement);For patient/therapist safety;To address functional/ADL transfers PT goals addressed during session: Mobility/safety with mobility;Balance;Proper use of DME;Strengthening/ROM         AM-PAC PT "6 Clicks" Mobility  Outcome Measure Help needed turning from your back to your side while in a flat bed without using bedrails?: A Lot Help needed moving from lying on your back to sitting on the side of a flat bed without using bedrails?: A Lot Help needed moving to and from a bed to a chair (including a wheelchair)?: A Little Help needed standing up from a chair using your arms (e.g., wheelchair or bedside chair)?: A Little Help needed to walk in hospital room?: A Little Help needed climbing 3-5 steps with a railing? : Total 6 Click Score: 14    End of Session Equipment Utilized During Treatment: Gait belt Activity Tolerance: Patient tolerated treatment well Patient left: in chair;with chair alarm set;with call bell/phone within reach Nurse Communication: Mobility status PT Visit Diagnosis: Difficulty in walking, not elsewhere classified (R26.2);Pain;History of falling (Z91.81) Pain - Right/Left:  Right Pain - part of body: Hip    Time: 0920-0955 PT Time Calculation (min) (ACUTE ONLY): 35 min   Charges:   PT Evaluation $PT Eval Moderate Complexity: 1 Mod          Philomena Doheny PT 06/20/2022  Acute Rehabilitation Services  Office  [phone]

## 2022-06-20 NOTE — Assessment & Plan Note (Signed)
Continue PPI and H2 blocker 

## 2022-06-20 NOTE — Assessment & Plan Note (Signed)
Continue Synthroid °

## 2022-06-20 NOTE — Assessment & Plan Note (Signed)
Inferior pubic rami fracture Nondisplaced right anterior and posterior acetabular fractures Secondary to a mechanical fall.  Orthopedic surgery was consulted and they were recommending conservative management.  Continue to have pain Patient will be weightbearing as tolerated PT/OT recommending home health which was ordered. -Continue with pain management-Will try fentanyl patch to decrease the use of IV, multiple allergies noted for different pain medications -Also added Toradol -Continue with supportive care

## 2022-06-20 NOTE — Assessment & Plan Note (Signed)
Stable.  She was previously on Octrevus which was stopped last year due to recurrent infections.  She is now on weekly IVIG per immunology.   -Continue home baclofen.

## 2022-06-20 NOTE — Assessment & Plan Note (Signed)
Patient has chronically intermittent hyponatremia which was initially thought to be due to either related to MS or MS meds. -Stop IV fluid -Monitor sodium

## 2022-06-20 NOTE — TOC Initial Note (Signed)
Transition of Care [facility]) - Initial/Assessment Note    Patient Details  Name: Kayla [name] MRN: [id number] Date of Birth: 1950/05/27  Transition of Care [facility]) CM/SW Contact:    Lennart Pall, LCSW Phone Number: 06/20/2022, 4:02 PM  Clinical Narrative:                  Met with pt and spouse to review possible dc planning needs.  Both asking what the "plan" is moving forward - home discharge vs rehab?  Explained to both that referral has been made to CIR but we are awaiting MD with CIR to review case to make sure pt meets medical necessity for CIR stay and hope to have answer over the weekend.  They are agreeable with this and note that, if she is not felt to be a CIR candidate, then they will plan home discharge with home health therapy follow up.  Spouse is able to provide 24/7 support for pt.  TOC will continue to follow and assist with any referral needs.     Expected Discharge Plan: [place] (vs CIR) Barriers to Discharge: No Barriers Identified   Patient Goals and CMS Choice Patient states their goals for this hospitalization and ongoing recovery are:: return home          Expected Discharge Plan and Services In-house Referral: Clinical Social Work     Living arrangements for the past 2 months: Single Family Home                                      Prior Living Arrangements/Services Living arrangements for the past 2 months: Single Family Home Lives with:: Spouse Patient language and need for interpreter reviewed:: Yes Do you feel safe going back to the place where you live?: Yes      Need for Family Participation in Patient Care: Yes (Comment) Care giver support system in place?: Yes (comment)   Criminal Activity/Legal Involvement Pertinent to Current Situation/Hospitalization: No - Comment as needed  Activities of Daily Living      Permission Sought/Granted Permission sought to share information with : Family Supports Permission  granted to share information with : Yes, Verbal Permission Granted  Share Information with NAME: spouse, Kayla [name] @ [phone]           Emotional Assessment Appearance:: Appears stated age Attitude/Demeanor/Rapport: Gracious Affect (typically observed): Quiet Orientation: : Oriented to Place, Oriented to  Time, Oriented to Situation, Oriented to Self Alcohol / Substance Use: Not Applicable Psych Involvement: No (comment)  Admission diagnosis:  Pelvic fracture [S32.9XXA] Fall, initial encounter [id number]] Laceration of scalp, initial encounter [S01.01XA] Closed fracture of ramus of right pubis, initial encounter [S32.591A] Closed nondisplaced fracture of right acetabulum, unspecified portion of acetabulum, initial encounter [S32.401A] Patient Active Problem List   Diagnosis Date Noted   GERD (gastroesophageal reflux disease) 06/20/2022   Pelvic fracture ([place]) 06/19/2022   Hypokalemia 06/19/2022   Pseudomonas urinary tract infection 05/28/2022   Acute cystitis without hematuria 03/01/2022   Chronic obstructive pulmonary disease ([place]) 09/24/2021   Pulmonary mycobacterial infection ([place]) 09/24/2021   Bilateral vocal cord paralysis 09/24/2021   Recurrent UTI (urinary tract infection) 05/24/2021   COPD (chronic obstructive pulmonary disease) (HCC)    Iron deficiency anemia secondary to inadequate dietary iron intake 12/20/2020   Purple toe syndrome of both feet ([place]) 07/26/2020   Cough variant asthma vs vcd  related to MS  11/10/2016   Positional sleep apnea 09/10/2016   Periodic limb movements of sleep 09/10/2016   OSA on CPAP 02/08/2016   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/21/2013   Dislocation of hip prosthesis ([place]) 03/12/2013   Urinary retention with incomplete bladder emptying 03/09/2013   Dysphagia, pharyngoesophageal phase 03/09/2013   Constipation 02/26/2013   Hyponatremia 05/20/2012   Post-menopausal osteoporosis 09/17/2007   Hypothyroidism 04/01/2007   Multiple sclerosis  ([place]) 04/01/2007   PCP:  Janith Lima, MD Pharmacy:   [facility], [address] [place] [address] Phone: [phone] Fax: [place], [address] [place] [address] Phone: [phone] Fax: [phone] [place] - [id number] [place] [address] Phone: [phone] Fax: [phone]  [facility] [address] [place] [address] Phone: [phone] Fax: [phone]     Social Determinants of Health (SDOH) Social History: SDOH Screenings   Food Insecurity: No Food Insecurity (05/22/2022)  Housing: Low Risk  (05/22/2022)  Transportation Needs: No Transportation Needs (05/22/2022)  Alcohol Screen: Low Risk  (10/04/2021)  Depression (PHQ2-9): Low Risk  (05/22/2022)  Financial Resource Strain: Low Risk  (10/04/2021)  Physical Activity: Insufficiently Active (10/04/2021)  Social Connections: Moderately Isolated (10/04/2021)  Stress: No Stress Concern Present (10/04/2021)  Tobacco Use: Low Risk  (06/19/2022)   SDOH Interventions:     Readmission Risk Interventions    06/20/2022    3:58 PM  Readmission Risk Prevention Plan  Post Dischage Appt Complete  Medication Screening Complete  Transportation Screening Complete

## 2022-06-20 NOTE — Assessment & Plan Note (Signed)
Resolved.  Magnesium was normal -Monitor potassium and replace as needed

## 2022-06-20 NOTE — Progress Notes (Signed)
Progress Note   Patient: Kayla [name] [id number] DOB: 19-Aug-1950 DOA: 06/19/2022     0 DOS: the patient was seen and examined on 06/20/2022   Brief hospital course: Taken from H&P.  CHARLETA NEIHEISEL is a 72 y.o. female with medical history significant of MS, hypothyroidism, cough variant asthma versus vocal cord dysfunction related to MS, chronic constipation, iron deficiency anemia, recurrent UTIs, cervical cancer, osteoporosis, sleep apnea, history of right hip replacement presented to the ED from home after a mechanical fall.  Patient reported hitting her head on the floor and also complained of right hip pain.   Patient states she has MS and normally uses a walker to ambulate but today she had to make a short trip to the kitchen and did not have her walker with her when she lost her balance and fell on her right side. She reports hitting her head on the floor but denies loss of consciousness.    Vital signs stable.  Labs significant for no leukocytosis, hemoglobin 11.0 (was 11.8 on 05/26/2022 and previously 12-13 on labs done 5-6 months ago), potassium 3.2, calcium 6.8 (albumin level not checked), UA pending.  Chest x-ray showing mild left basilar atelectasis.  X-ray of right hip/pelvis showing evidence of prosthetic dislocation but no acute fracture.  CT head/C-spine negative for acute finding.   CT of right hip without contrast showing: "IMPRESSION: 1. Full shaft width inferior right pubic rami fracture. Associated nondisplaced right posterior acetabulum fracture. Suggestion of an acute nondisplaced anterior right acetabular fracture 2. Total right hip arthroplasty. No CT finding to suggest surgical hardware complication. 3. Foci of subcutaneus soft tissue emphysema along the anterior right pelvis as well as multiple foci of emphysema along the right proximal medial thigh of unclear etiology. Finding may be related to infection versus trauma more distally.  3/29: Vitals stable.   Orthopedic surgery was consulted and they were recommending 50% weightbearing on right lower extremity with conservative management as she has right pubic rami fracture which does not involve the prosthetic or acetabulum.  Labs with stable hemoglobin, mild hyponatremia where sodium decreased to 130. PT and OT are recommending CIR. Continue to have significant pain -adding Toradol and can convert fentanyl patch       Assessment and Plan: * Pelvic fracture (HCC) Inferior pubic rami fracture Nondisplaced right anterior and posterior acetabular fractures Secondary to a mechanical fall.  Orthopedic surgery was consulted and they were recommending conservative management.  Continue to have pain Patient will be weightbearing as tolerated PT/OT recommending home health which was ordered. -Continue with pain management-Will try fentanyl patch to decrease the use of IV, multiple allergies noted for different pain medications -Also added Toradol -Continue with supportive care  Hypokalemia Resolved.  Magnesium was normal -Monitor potassium and replace as needed  Hypothyroidism -Continue Synthroid  Multiple sclerosis (HCC) Stable.  She was previously on Octrevus which was stopped last year due to recurrent infections.  She is now on weekly IVIG per immunology.   -Continue home baclofen.   Cough variant asthma vs vcd  related to MS  No acute concern -Continue home meds  Hyponatremia Patient has chronically intermittent hyponatremia which was initially thought to be due to either related to MS or MS meds. -Stop IV fluid -Monitor sodium  Iron deficiency anemia secondary to inadequate dietary iron intake Hemoglobin seems stable. -Continue home iron supplement  GERD (gastroesophageal reflux disease) -Continue PPI and H2 blocker   Subjective: Patient was complaining of 8/10 pain.  She  was getting ready to work with PT.  Husband and daughter at bedside  Physical Exam: Vitals:    06/19/22 2315 06/19/22 2345 06/20/22 0104 06/20/22 0540  BP: 105/61 110/61 104/60 (!) 105/50  Pulse: (!) 59 (!) 59 61 65  Resp: 16 15 16 16   Temp:   98.1 F (36.7 C) 97.7 F (36.5 C)  TempSrc:   Oral Oral  SpO2: 99% 98% 98% 90%   General.  Frail lady, in no acute distress. Pulmonary.  Lungs clear bilaterally, normal respiratory effort. CV.  Regular rate and rhythm, no JVD, rub or murmur. Abdomen.  Soft, nontender, nondistended, BS positive. CNS.  Alert and oriented .  No focal neurologic deficit. Extremities.  Trace LE edema, no cyanosis, pulses intact and symmetrical. Psychiatry.  Judgment and insight appears normal.  Data Reviewed: Prior data reviewed  Family Communication: Discussed with husband and daughter at bedside  Disposition: Status is: Inpatient Remains inpatient appropriate because: Severity of illness  Planned Discharge Destination: Home with Home Health  DVT prophylaxis.  Lovenox Time spent: 45 minutes  This record has been created using Systems analyst. Errors have been sought and corrected,but may not always be located. Such creation errors do not reflect on the standard of care.   Author: Lorella Nimrod, MD 06/20/2022 1:44 PM  For on call review [email or web address].

## 2022-06-20 NOTE — Assessment & Plan Note (Signed)
No acute concern -Continue home meds

## 2022-06-20 NOTE — Progress Notes (Signed)
Inpatient Rehab Admissions Coordinator:   Per therapy recommendations pt was screened for CIR by Shann Medal, PT, DPT.  Unclear whether pt meets medical necessity for inpatient rehab admit.  Will review with our MDs.   Shann Medal, PT, DPT Admissions Coordinator [phone] 06/20/22  2:31 PM

## 2022-06-20 NOTE — Evaluation (Signed)
Occupational Therapy Evaluation Patient Details Name: Kayla [name] MRN: [id number] DOB: July 17, 1950 Today's Date: 06/20/2022   History of Present Illness Kayla [name] is a 72 y.o. female pesented to the ED from home after a mechanical fall. Patient reported hitting her head on the floor and also complained of right hip pain. CT of pelvis full shaft width inferior right pubic rami fracture. Associated  nondisplaced right posterior acetabulum fracture. Suggestive of acute nondisplaced anterior right acetabular fracture. PHMx: MS, hypothyroidism, cough variant asthma versus vocal cord dysfunction related to MS, chronic constipation, iron deficiency anemia, recurrent UTIs, cervical cancer, osteoporosis, sleep apnea, history of right hip replacement 08/2015 (revision 2019), right humeral head fx.   Clinical Impression   This 72 yo female admitted with above presents to acute OT with PLOF of being Mod I with all basic ADLs and doing some IADLs from a RW level. Currently she is setup-max A for basic ADLs and min -max A +2 for mobility. She will continue to benefit from acute OT with follow up on an inpatient therapy basis being the best option.     Recommendations for follow up therapy are one component of a multi-disciplinary discharge planning process, led by the attending physician.  Recommendations may be updated based on patient status, additional functional criteria and insurance authorization.   Assistance Recommended at Discharge Frequent or constant Supervision/Assistance  Patient can return home with the following A little help with walking and/or transfers;A lot of help with bathing/dressing/bathroom;Assistance with cooking/housework;Help with stairs or ramp for entrance;Assist for transportation    Functional Status Assessment  Patient has had a recent decline in their functional status and demonstrates the ability to make significant improvements in function in a reasonable and  predictable amount of time.  Equipment Recommendations  None recommended by OT    Recommendations for Other Services Rehab consult     Precautions / Restrictions Precautions Precautions: Fall Precaution Comments: h/o R foot drop, uses bioness; denies other falls in past 6 months Restrictions Weight Bearing Restrictions: Yes RLE Weight Bearing: Partial weight bearing RLE Partial Weight Bearing Percentage or Pounds: 50%      Mobility Bed Mobility Overal bed mobility: Needs Assistance Bed Mobility: Supine to Sit     Supine to sit: +2 for physical assistance, Max assist     General bed mobility comments: assist to raise trunk and pivot hips to EOB    Transfers Overall transfer level: Needs assistance Equipment used: Rolling walker (2 wheels) Transfers: Sit to/from Stand Sit to Stand: Mod assist, From elevated surface           General transfer comment: VCs hand placement      Balance Overall balance assessment: Needs assistance, History of Falls Sitting-balance support: Feet supported, No upper extremity supported Sitting balance-Leahy Scale: Fair     Standing balance support: Bilateral upper extremity supported Standing balance-Leahy Scale: Poor                             ADL either performed or assessed with clinical judgement   ADL Overall ADL's : Needs assistance/impaired Eating/Feeding: Independent Eating/Feeding Details (indicate cue type and reason): sitting in recliner Grooming: Set up;Sitting Grooming Details (indicate cue type and reason): sitting in recliner Upper Body Bathing: Set up Upper Body Bathing Details (indicate cue type and reason): sitting in recliner Lower Body Bathing: Moderate assistance Lower Body Bathing Details (indicate cue type and reason): mod A sit<>stand Upper Body  Dressing : Set up Upper Body Dressing Details (indicate cue type and reason): sitting in recliner Lower Body Dressing: Maximal assistance Lower  Body Dressing Details (indicate cue type and reason): mod A sit<>stand Toilet Transfer: Minimal assistance;Ambulation;Rolling walker (2 wheels) Toilet Transfer Details (indicate cue type and reason): simulated bed> 8 feet to door>sit in recliner behind her Toileting- Water quality scientist and Hygiene: Moderate assistance Toileting - Clothing Manipulation Details (indicate cue type and reason): mod A sit<>stand             Vision Baseline Vision/History: 1 Wears glasses Patient Visual Report: No change from baseline              Pertinent Vitals/Pain Pain Assessment Pain Assessment: 0-10 Pain Score: 8  Pain Location: R hip Pain Descriptors / Indicators: Sore Pain Intervention(s): Limited activity within patient's tolerance, Monitored during session, Repositioned, Ice applied     Hand Dominance Right   Extremity/Trunk Assessment Upper Extremity Assessment Upper Extremity Assessment: Overall WFL for tasks assessed   Lower Extremity Assessment Lower Extremity Assessment: RLE deficits/detail RLE Deficits / Details: knee ext -3/5, ankle DF -4/5, ankle PF +3/5 (pt wears bioness) RLE Sensation: WNL   Cervical / Trunk Assessment Cervical / Trunk Assessment: Normal   Communication Communication Communication: Expressive difficulties   Cognition Arousal/Alertness: Awake/alert Behavior During Therapy: WFL for tasks assessed/performed Overall Cognitive Status: Within Functional Limits for tasks assessed                                                  Home Living Family/patient expects to be discharged to:: Private residence Living Arrangements: Spouse/significant other Available Help at Discharge: Family;Available 24 hours/day Type of Home: House Home Access: Stairs to enter [employer] of Steps: 4 Entrance Stairs-Rails: Right;Left Home Layout: One level     Bathroom Shower/Tub: Occupational psychologist: Handicapped height      Home Equipment: Conservation officer, nature (2 wheels);Shower seat;Grab bars - tub/shower   Additional Comments: uses bioness for R foot drop      Prior Functioning/Environment Prior Level of Function : Driving;Independent/Modified Independent             Mobility Comments: walks with RW, no other falls in past 6 months ADLs Comments: self catheterizes, 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 Problem List: Decreased strength;Decreased range of motion;Impaired balance (sitting and/or standing);Pain      OT Treatment/Interventions: Self-care/ADL training;DME and/or AE instruction;Patient/family education;Balance training    OT Goals(Current goals can be found in the care plan section) Acute Rehab OT Goals Patient Stated Goal: would like rehab before home OT Goal Formulation: With patient/family Time For Goal Achievement: 07/04/22 Potential to Achieve Goals: Good  OT Frequency: Min 2X/week    Co-evaluation PT/OT/SLP Co-Evaluation/Treatment: Yes Reason for Co-Treatment: Complexity of the patient's impairments (multi-system involvement);For patient/therapist safety;To address functional/ADL transfers PT goals addressed during session: Mobility/safety with mobility;Balance;Proper use of DME;Strengthening/ROM OT goals addressed during session: ADL's and self-care;Strengthening/ROM      AM-PAC OT "6 Clicks" Daily Activity     Outcome Measure Help from another person eating meals?: None Help from another person taking care of personal grooming?: A Little Help from another person toileting, which includes using toliet, bedpan, or urinal?: A Lot Help from another person bathing (including washing, rinsing, drying)?: A Lot  Help from another person to put on and taking off regular upper body clothing?: A Little Help from another person to put on and taking off regular lower body clothing?: A Lot 6 Click Score: 16   End of Session Equipment Utilized During Treatment:  Gait belt;Rolling walker (2 wheels) Nurse Communication: Mobility status  Activity Tolerance: Patient tolerated treatment well Patient left: in chair;with call bell/phone within reach;with chair alarm set  OT Visit Diagnosis: Unsteadiness on feet (R26.81);Other abnormalities of gait and mobility (R26.89);Muscle weakness (generalized) (M62.81);History of falling (Z91.81);Pain Pain - Right/Left: Right Pain - part of body:  (pelvis)                Time: 1001-1030 OT Time Calculation (min): 29 min Charges:  OT General Charges $OT Visit: 1 Visit OT Evaluation $OT Eval Moderate Complexity: 1 [place] [facility] [phone]    Almon Register 06/20/2022, 12:21 PM

## 2022-06-21 DIAGNOSIS — S32501A Unspecified fracture of right pubis, initial encounter for closed fracture: Secondary | ICD-10-CM | POA: Diagnosis not present

## 2022-06-21 LAB — BASIC METABOLIC PANEL
Anion gap: 6 (ref 5–15)
BUN: 16 mg/dL (ref 8–23)
CO2: 24 mmol/L (ref 22–32)
Calcium: 8.7 mg/dL — ABNORMAL LOW (ref 8.9–10.3)
Chloride: 100 mmol/L (ref 98–111)
Creatinine, Ser: 0.73 mg/dL (ref 0.44–1.00)
GFR, Estimated: >60 mL/min (ref >60)
Glucose, Bld: 101 mg/dL — ABNORMAL HIGH (ref 70–99)
Potassium: 4.5 mmol/L (ref 3.5–5.1)
Sodium: 130 mmol/L — ABNORMAL LOW (ref 135–145)

## 2022-06-21 NOTE — Assessment & Plan Note (Signed)
Inferior pubic rami fracture Nondisplaced right anterior and posterior acetabular fractures Secondary to a mechanical fall.  Orthopedic surgery was consulted and they were recommending conservative management.  Continue to have pain Patient will be weightbearing as tolerated PT/OT recommending CIR-patient can go tomorrow -Continue with pain management-fentanyl patches seems working better. -Also added Toradol -Continue with supportive care

## 2022-06-21 NOTE — Assessment & Plan Note (Signed)
Patient has chronically intermittent hyponatremia which was initially thought to be due to either related to MS or MS meds.  Currently seems stable -Monitor sodium

## 2022-06-21 NOTE — Progress Notes (Signed)
Progress Note   Patient: Kayla [name] [id number] DOB: 05-26-1950 DOA: 06/19/2022     1 DOS: the patient was seen and examined on 06/21/2022   Brief hospital course: Taken from H&P.  Kayla [name] is a 72 y.o. female with medical history significant of MS, hypothyroidism, cough variant asthma versus vocal cord dysfunction related to MS, chronic constipation, iron deficiency anemia, recurrent UTIs, cervical cancer, osteoporosis, sleep apnea, history of right hip replacement presented to the ED from home after a mechanical fall.  Patient reported hitting her head on the floor and also complained of right hip pain.   Patient states she has MS and normally uses a walker to ambulate but today she had to make a short trip to the kitchen and did not have her walker with her when she lost her balance and fell on her right side. She reports hitting her head on the floor but denies loss of consciousness.    Vital signs stable.  Labs significant for no leukocytosis, hemoglobin 11.0 (was 11.8 on 05/26/2022 and previously 12-13 on labs done 5-6 months ago), potassium 3.2, calcium 6.8 (albumin level not checked), UA pending.  Chest x-ray showing mild left basilar atelectasis.  X-ray of right hip/pelvis showing evidence of prosthetic dislocation but no acute fracture.  CT head/C-spine negative for acute finding.   CT of right hip without contrast showing: "IMPRESSION: 1. Full shaft width inferior right pubic rami fracture. Associated nondisplaced right posterior acetabulum fracture. Suggestion of an acute nondisplaced anterior right acetabular fracture 2. Total right hip arthroplasty. No CT finding to suggest surgical hardware complication. 3. Foci of subcutaneus soft tissue emphysema along the anterior right pelvis as well as multiple foci of emphysema along the right proximal medial thigh of unclear etiology. Finding may be related to infection versus trauma more distally.  3/29: Vitals stable.   Orthopedic surgery was consulted and they were recommending 50% weightbearing on right lower extremity with conservative management as she has right pubic rami fracture which does not involve the prosthetic or acetabulum.  Labs with stable hemoglobin, mild hyponatremia where sodium decreased to 130. PT and OT are recommending CIR. Continue to have significant pain -adding Toradol and can convert fentanyl patch.  3/30: Vital stable.  Labs with stable sodium at 130.  Per patient fentanyl patch seems helping, lidocaine patch does not cause much relief.  Had a bed offer for CIR tomorrow.    Assessment and Plan: * Pelvic fracture ([place]) Inferior pubic rami fracture Nondisplaced right anterior and posterior acetabular fractures Secondary to a mechanical fall.  Orthopedic surgery was consulted and they were recommending conservative management.  Continue to have pain Patient will be weightbearing as tolerated PT/OT recommending CIR-patient can go tomorrow -Continue with pain management-fentanyl patches seems working better. -Also added Toradol -Continue with supportive care  Hypokalemia Resolved.  Magnesium was normal -Monitor potassium and replace as needed  Hypothyroidism -Continue Synthroid  Multiple sclerosis (HCC) Stable.  She was previously on Octrevus which was stopped last year due to recurrent infections.  She is now on weekly IVIG per immunology.   -Continue home baclofen.   Cough variant asthma vs vcd  related to MS  No acute concern -Continue home meds  Hyponatremia Patient has chronically intermittent hyponatremia which was initially thought to be due to either related to MS or MS meds.  Currently seems stable -Monitor sodium  Iron deficiency anemia secondary to inadequate dietary iron intake Hemoglobin seems stable. -Continue home iron supplement  GERD (gastroesophageal  reflux disease) -Continue PPI and H2 blocker   Subjective: Patient was sitting in chair when  seen today.  Per patient pain seems much improved than yesterday.  She thinks that fentanyl patch is working good, does not think that lidocaine patches causing any relief.  Multiple family members at bedside.    Physical Exam: Vitals:   06/20/22 2240 06/21/22 0148 06/21/22 0442 06/21/22 1332  BP: (!) 100/58 119/61 135/67 101/66  Pulse: 66 64 (!) 59 78  Resp: 14 17 16 14   Temp: 99 F (37.2 C) 98.9 F (37.2 C) 98.9 F (37.2 C) 99.1 F (37.3 C)  TempSrc: Oral Oral Oral Oral  SpO2: 90% 91% 94% 97%   General.  Malnourished lady, in no acute distress. Pulmonary.  Lungs clear bilaterally, normal respiratory effort. CV.  Regular rate and rhythm, no JVD, rub or murmur. Abdomen.  Soft, nontender, nondistended, BS positive. CNS.  Alert and oriented .  No focal neurologic deficit. Extremities.  No edema, no cyanosis, pulses intact and symmetrical. Psychiatry.  Judgment and insight appears normal.   Data Reviewed: Prior data reviewed  Family Communication: Discussed with daughter at bedside  Disposition: Status is: Inpatient Remains inpatient appropriate because: Severity of illness  Planned Discharge Destination: Home with Home Health  DVT prophylaxis.  Lovenox Time spent: 40 minutes  This record has been created using Systems analyst. Errors have been sought and corrected,but may not always be located. Such creation errors do not reflect on the standard of care.   Author: Lorella Nimrod, MD 06/21/2022 1:53 PM  For on call review [email or web address].

## 2022-06-21 NOTE — Progress Notes (Signed)
Physical Therapy Treatment Patient Details Name: Kayla [name] MRN: [id number] DOB: Jun 28, 1950 Today's Date: 06/21/2022   History of Present Illness Kayla [name] is a 72 y.o. female pesented to the ED from home after a mechanical fall. Patient reported hitting her head on the floor and also complained of right hip pain. CT of pelvis full shaft width inferior right pubic rami fracture. Associated  nondisplaced right posterior acetabulum fracture. Suggestive of acute nondisplaced anterior right acetabular fracture. PHMx: MS, hypothyroidism, cough variant asthma versus vocal cord dysfunction related to MS, chronic constipation, iron deficiency anemia, recurrent UTIs, cervical cancer, osteoporosis, sleep apnea, history of right hip replacement 08/2015 (revision 2019), right humeral head fx.    PT Comments    Pt is motivated to work with PT, able to [employer] amb distance and notes decr pain today. Requires cues throughout for safety, fatigues easily with mobility.  Will benefit from post acute rehab.    Recommendations for follow up therapy are one component of a multi-disciplinary discharge planning process, led by the attending physician.  Recommendations may be updated based on patient status, additional functional criteria and insurance authorization.  Follow Up Recommendations       Assistance Recommended at Discharge Intermittent Supervision/Assistance  Patient can return home with the following Assistance with cooking/housework;Assist for transportation;Help with stairs or ramp for entrance;A little help with bathing/dressing/bathroom;A little help with walking and/or transfers   Equipment Recommendations  None recommended by PT    Recommendations for Other Services       Precautions / Restrictions Precautions Precautions: Fall Precaution Comments: h/o R foot drop, uses bioness; denies other falls in past 6 months Restrictions Weight Bearing Restrictions: Yes RLE Weight Bearing:  Partial weight bearing RLE Partial Weight Bearing Percentage or Pounds: 50%     Mobility  Bed Mobility Overal bed mobility: Needs Assistance Bed Mobility: Sit to Supine       Sit to supine: Min assist   General bed mobility comments: assist to lift bil LEs on to bed    Transfers Overall transfer level: Needs assistance Equipment used: Rolling walker (2 wheels) Transfers: Sit to/from Stand Sit to Stand: Min assist           General transfer comment: VCs hand placement    Ambulation/Gait Ambulation/Gait assistance: Min guard Gait Distance (Feet): 12 Feet (x2) Assistive device: Rolling walker (2 wheels) Gait Pattern/deviations: Step-to pattern, Decreased step length - right, Decreased step length - left, Narrow base of support Gait velocity: decr     General Gait Details: VCs for 50% PWB and to widen base of support, distance limited by pain/fatigue   Stairs             Wheelchair Mobility    Modified Rankin (Stroke Patients Only)       Balance Overall balance assessment: Needs assistance, History of Falls Sitting-balance support: Feet supported, No upper extremity supported Sitting balance-Leahy Scale: Fair     Standing balance support: Single extremity supported, During functional activity, Reliant on assistive device for balance Standing balance-Leahy Scale: Poor Standing balance comment: reliant on at least unilateral UE support for safety; able to perform her on peri-care with L UE support and min/guard for safety, cues for [facility]                            Cognition Arousal/Alertness: Awake/alert Behavior During Therapy: WFL for tasks assessed/performed Overall Cognitive Status: Within Functional Limits for tasks assessed  General Comments: patient had husband, daughter and grandchildren present during session        Exercises General Exercises - Lower Extremity Ankle Circles/Pumps:  AROM, Both, 10 reps    General Comments  Assisted pt with donning top and shorts per her request      Pertinent Vitals/Pain Pain Assessment Pain Assessment: Faces Faces Pain Scale: Hurts little more Pain Location: R hip Pain Descriptors / Indicators: Sore, Guarding    Home Living                          Prior Function            PT Goals (current goals can now be found in the care plan section) Acute Rehab PT Goals Patient Stated Goal: to get stronger PT Goal Formulation: With patient/family Time For Goal Achievement: 06/27/22 Potential to Achieve Goals: Good Progress towards PT goals: Progressing toward goals    Frequency    Min 4X/week      PT Plan Current plan remains appropriate    Co-evaluation              AM-PAC PT "6 Clicks" Mobility   Outcome Measure  Help needed turning from your back to your side while in a flat bed without using bedrails?: A Little Help needed moving from lying on your back to sitting on the side of a flat bed without using bedrails?: A Little Help needed moving to and from a bed to a chair (including a wheelchair)?: A Little Help needed standing up from a chair using your arms (e.g., wheelchair or bedside chair)?: A Little Help needed to walk in hospital room?: A Little Help needed climbing 3-5 steps with a railing? : A Lot 6 Click Score: 17    End of Session Equipment Utilized During Treatment: Gait belt Activity Tolerance: Patient tolerated treatment well Patient left: in bed;with call bell/phone within reach;with bed alarm set;with family/visitor present Nurse Communication: Mobility status PT Visit Diagnosis: Difficulty in walking, not elsewhere classified (R26.2);Pain;History of falling (Z91.81) Pain - Right/Left: Right Pain - part of body: Hip     Time: [id number] PT Time Calculation (min) (ACUTE ONLY): 25 min  Charges:  $Gait Training: 23-37 mins                     Sahas Sluka, PT  Acute Rehab Dept  ([place]) [phone]  06/21/2022    [facility] 06/21/2022, 4:15 PM

## 2022-06-21 NOTE — Assessment & Plan Note (Signed)
Resolved.  Magnesium was normal -Monitor potassium and replace as needed

## 2022-06-21 NOTE — Plan of Care (Signed)

## 2022-06-21 NOTE — Progress Notes (Signed)
Occupational Therapy Treatment Patient Details Name: Kayla [name] MRN: [id number] DOB: 03/26/1950 Today's Date: 06/21/2022   History of present illness Kayla [name] is a 72 y.o. female pesented to the ED from home after a mechanical fall. Patient reported hitting her head on the floor and also complained of right hip pain. CT of pelvis full shaft width inferior right pubic rami fracture. Associated  nondisplaced right posterior acetabulum fracture. Suggestive of acute nondisplaced anterior right acetabular fracture. PHMx: MS, hypothyroidism, cough variant asthma versus vocal cord dysfunction related to MS, chronic constipation, iron deficiency anemia, recurrent UTIs, cervical cancer, osteoporosis, sleep apnea, history of right hip replacement 08/2015 (revision 2019), right humeral head fx.   OT comments  Patient was noted to make progress towards goals with patient able to complete transfer with reduced A. Patient was able to engage in perineal hygiene and clothing management with reduced A as well. Patient continues to have increased pain, decreased functional activity tolerance, decreased standing balance, and decreased knowledge of use of AD/AE impacting participation in ADLs. Patient's discharge plan remains appropriate at this time. OT will continue to follow acutely.     Recommendations for follow up therapy are one component of a multi-disciplinary discharge planning process, led by the attending physician.  Recommendations may be updated based on patient status, additional functional criteria and insurance authorization.    Assistance Recommended at Discharge Frequent or constant Supervision/Assistance  Patient can return home with the following  A little help with walking and/or transfers;A lot of help with bathing/dressing/bathroom;Assistance with cooking/housework;Help with stairs or ramp for entrance;Assist for transportation   Equipment Recommendations  None recommended by OT        Precautions / Restrictions Precautions Precautions: Fall Precaution Comments: h/o R foot drop, uses bioness; denies other falls in past 6 months Restrictions Weight Bearing Restrictions: Yes RLE Weight Bearing: Partial weight bearing RLE Partial Weight Bearing Percentage or Pounds: 50%       Mobility Bed Mobility Overal bed mobility: Needs Assistance Bed Mobility: Supine to Sit     Supine to sit: Min assist     General bed mobility comments: with HOB raised and increased time.             ADL either performed or assessed with clinical judgement   ADL Overall ADL's : Needs assistance/impaired     Grooming: Set up;Sitting Grooming Details (indicate cue type and reason): sitting in recliner Upper Body Bathing: Set up Upper Body Bathing Details (indicate cue type and reason): sitting in recliner Lower Body Bathing: Moderate assistance   Upper Body Dressing : Set up Upper Body Dressing Details (indicate cue type and reason): sitting in recliner     Toilet Transfer: Minimal assistance;Ambulation;Rolling walker (2 wheels) Toilet Transfer Details (indicate cue type and reason): to transfer to recliner in room. patient declined to use bathroom on this date Toileting- Water quality scientist and Hygiene: Minimal assistance;Sit to/from stand Toileting - Clothing Manipulation Details (indicate cue type and reason): with education on how to maintain WB restrictions in standing. patient was able to complete hygiene and clothing managemnet with increased time.              Cognition Arousal/Alertness: Awake/alert Behavior During Therapy: WFL for tasks assessed/performed Overall Cognitive Status: Within Functional Limits for tasks assessed         General Comments: patient had daughters and grandchildren present during session  Pertinent Vitals/ Pain       Pain Assessment Pain Assessment: Faces Faces Pain Scale: Hurts whole lot Pain Location:  R hip Pain Descriptors / Indicators: Sore, Guarding Pain Intervention(s): Limited activity within patient's tolerance, Monitored during session, Repositioned, Premedicated before session            Progress Toward Goals  OT Goals(current goals can now be found in the care plan section)  Progress towards OT goals: Progressing toward goals     Plan Discharge plan remains appropriate       AM-PAC OT "6 Clicks" Daily Activity     Outcome Measure   Help from another person eating meals?: None Help from another person taking care of personal grooming?: A Little Help from another person toileting, which includes using toliet, bedpan, or urinal?: A Lot Help from another person bathing (including washing, rinsing, drying)?: A Lot Help from another person to put on and taking off regular upper body clothing?: A Little Help from another person to put on and taking off regular lower body clothing?: A Lot 6 Click Score: 16    End of Session Equipment Utilized During Treatment: Gait belt;Rolling walker (2 wheels)  OT Visit Diagnosis: Unsteadiness on feet (R26.81);Other abnormalities of gait and mobility (R26.89);Muscle weakness (generalized) (M62.81);History of falling (Z91.81);Pain Pain - Right/Left: Right Pain - part of body:  (pelvis)   Activity Tolerance Patient tolerated treatment well   Patient Left in chair;with call bell/phone within reach;with family/visitor present   Nurse Communication Mobility status        Time: 1114-1140 OT Time Calculation (min): 26 min  Charges: OT General Charges $OT Visit: 1 Visit OT Treatments $Self Care/Home Management : 23-37 mins Rennie Plowman, MS Acute Rehabilitation Department Office# [phone]   Willa Rough 06/21/2022, 12:32 PM

## 2022-06-21 NOTE — PMR Pre-admission (Signed)
PMR Admission Coordinator Pre-Admission Assessment  Patient: Kayla [name] is an 72 y.o., female MRN: [id number] DOB: 02-04-1951 Height:   Weight:    Insurance Information HMO:     PPO:      PCP:      IPA:      80/20: yes     OTHER:  PRIMARY: Medicare A and B      Policy#: [id number]      Pre-Cert#:       Employer:  Benefits:  Phone #:      Name:  Eff. Date: Parts A and B effective 07/23/1998  Deduct: [id number]      Out of Pocket Max:  None      Life Max: N/A  CIR: 100%      SNF: 100 days Outpatient: 80%     Co-Pay: 20% Home Health: 100%      Co-Pay: none DME: 80%     Co-Pay: 20% Providers: patient's choiceSECONDARY: BCBS Supplement       Policy#: [id number]      Phone#:   Financial Counselor:       Phone#:   The "Data Collection Information Summary" for patients in Inpatient Rehabilitation Facilities with attached "Privacy Act [place] Records" was provided and verbally reviewed with: Pt  Emergency Contact Information Contact Information     Name Relation Home Work [place] [phone]   Hardin,Bobbie Relative   [phone]   Lesle, [place] Daughter   [phone]   Annalia, Le[place] Daughter [phone]         Current Medical History  Patient Admitting Diagnosis: R pubic rami fx  History of Present Illness: Kayla [name] is a 72 y.o. female with medical history significant of MS, hypothyroidism, cough variant asthma versus vocal cord dysfunction related to MS, chronic constipation, iron deficiency anemia, recurrent UTIs, cervical cancer, osteoporosis, sleep apnea, history of right hip replacement presented to the  [facility] ED from home after a mechanical fall 06/19/22.  Patient reported hitting her head on the floor and also complained of right hip pain.  Vital signs stable.  Labs significant for no leukocytosis, hemoglobin 11.0 (was 11.8 on 05/26/2022 and previously 12-13 on labs done 5-6 months ago), potassium 3.2,  calcium 6.8 (albumin level not checked), UA pending.  Chest x-ray showing mild left basilar atelectasis.  X-ray of right hip/pelvis showing evidence of prosthetic dislocation but no acute fracture.  CT head/C-spine negative for acute finding.  CT of right hip without contrast showing a full shaft width inferior right pubic rami fracture. Associated nondisplaced right posterior acetabulum fracture. Suggestion of an acute nondisplaced anterior right acetabular fracture.  Patient received Valium, fentanyl, and Tdap injection.  ED physician spoke to Dr. Rolena Infante with orthopedics who recommended nonsurgical management including PT/OT, and pain control.  PT and OT saw Pt. And they recommended CIR to assist return to PLOF.       Patient's medical record from [facility]  has been reviewed by the rehabilitation admission coordinator and physician.  Past Medical History  Past Medical History:  Diagnosis Date   Arthritis    oa   Cervical cancer ([place]) 2005   COPD (chronic obstructive pulmonary disease) ([place])    Multiple sclerosis ([place])    Multiple sclerosis ([place]) 1996   Neuromuscular disorder ([place])    Osteoporosis    Other diseases of vocal cords    vocal cords are not even   Other voice and resonance disorders    Pneumonia  2014, 1992   Sleep apnea    no cpap used   Unspecified hypothyroidism     Has the patient had major surgery during 100 days prior to admission? Yes  Family History   family history includes Atrial fibrillation in her brother; Colon cancer in her father; Heart attack in her maternal grandfather; Other in her mother; Parkinson's disease in her paternal grandfather.  Current Medications  Current Facility-Administered Medications:    albuterol (VENTOLIN HFA) 108 (90 Base) MCG/ACT inhaler 2 puff, 2 puff, Inhalation, Q6H PRN, Shela Leff, MD   amphetamine-dextroamphetamine (ADDERALL) tablet 20 mg, 20 mg, Oral, Daily, Shela Leff, MD, 20 mg at 06/21/22  [id number]   baclofen (LIORESAL) tablet 20 mg, 20 mg, Oral, Daily, Shela Leff, MD, 20 mg at 06/21/22 [id number]   calcium carbonate (OS-CAL - dosed in mg of elemental calcium) tablet 1,250 mg, 1 tablet, Oral, Q breakfast, Amin, Soundra Pilon, MD, 1,250 mg at 06/21/22 0926   docusate sodium (COLACE) capsule 100 mg, 100 mg, Oral, Daily PRN, Lorella Nimrod, MD, 100 mg at 06/20/22 1734   enoxaparin (LOVENOX) injection 30 mg, 30 mg, Subcutaneous, Q24H, Bell, Michelle T, RPH, 30 mg at 06/20/22 1737   famotidine (PEPCID) tablet 20 mg, 20 mg, Oral, QPC supper, Shela Leff, MD, 20 mg at 06/20/22 1734   fentaNYL (DURAGESIC) 25 MCG/HR 1 patch, 1 patch, Transdermal, Q72H, Amin, Soundra Pilon, MD, 1 patch at 06/20/22 1349   fentaNYL (SUBLIMAZE) injection 12.5 mcg, 12.5 mcg, Intravenous, Q2H PRN, Shela Leff, MD, 12.5 mcg at 06/21/22 [id number]   ferrous sulfate tablet 325 mg, 325 mg, Oral, Q supper, Shela Leff, MD, 325 mg at 06/20/22 1734   FLUoxetine (PROZAC) capsule 40 mg, 40 mg, Oral, Daily, Shela Leff, MD, 40 mg at 06/21/22 0926   ketorolac (TORADOL) 15 MG/ML injection 15 mg, 15 mg, Intravenous, Q8H PRN, Lorella Nimrod, MD, 15 mg at 06/20/22 2042   levothyroxine (SYNTHROID) tablet 100 mcg, 100 mcg, Oral, Q0600, Shela Leff, MD, 100 mcg at 06/21/22 [id number]   lidocaine (LIDODERM) 5 % 1 patch, 1 patch, Transdermal, Q24H, Amin, Soundra Pilon, MD, 1 patch at 06/20/22 1736   methenamine (MANDELAMINE) tablet 1,000 mg, 1,000 mg, Oral, BID WC, Shela Leff, MD, 1,000 mg at 06/21/22 0926   mirabegron ER (MYRBETRIQ) tablet 25 mg, 25 mg, Oral, Daily, Shela Leff, MD, 25 mg at 06/21/22 [id number]   naloxone (NARCAN) injection 0.4 mg, 0.4 mg, Intravenous, PRN, Shela Leff, MD   pantoprazole (PROTONIX) EC tablet 40 mg, 40 mg, Oral, Daily, Shela Leff, MD, 40 mg at 06/21/22 [id number]   tamsulosin (FLOMAX) capsule 0.4 mg, 0.4 mg, Oral, Daily, Shela Leff, MD, 0.4 mg at 06/21/22 0926   temazepam  (RESTORIL) capsule 15 mg, 15 mg, Oral, QHS, Rathore, Wandra Feinstein, MD, 15 mg at 06/20/22 0104   trimethoprim (TRIMPEX) tablet 100 mg, 100 mg, Oral, Daily, Shela Leff, MD, 100 mg at 06/21/22 [id number]  Patients Current Diet:  Diet Order             Diet Heart Room service appropriate? Yes; Fluid consistency: Thin  Diet effective now                   Precautions / Restrictions Precautions Precautions: Fall Precaution Comments: h/o R foot drop, uses bioness; denies other falls in past 6 months Restrictions Weight Bearing Restrictions: Yes RLE Weight Bearing: Partial weight bearing RLE Partial Weight Bearing Percentage or Pounds: 50   Has the patient had 2 or more falls or a fall with injury  in the past year? Yes  Prior Activity Level Community (5-7x/wk): Pt active in the community PTA  Prior Functional Level Self Care: Did the patient need help bathing, dressing, using the toilet or eating? Independent  Indoor Mobility: Did the patient need assistance with walking from room to room (with or without device)? Independent  Stairs: Did the patient need assistance with internal or external stairs (with or without device)? Independent  Functional Cognition: Did the patient need help planning regular tasks such as shopping or remembering to take medications? Independent  Patient Information Are you of Hispanic, Latino/a,or Spanish origin?: A. No, not of Hispanic, Latino/a, or Spanish origin What is your race?: A. White Do you need or want an interpreter to communicate with a doctor or health care staff?: 0. No  Patient's Response To:  Health Literacy and Transportation Is the patient able to respond to health literacy and transportation needs?: Yes Health Literacy - How often do you need to have someone help you when you read instructions, pamphlets, or other written material from your doctor or pharmacy?: Never In the past 12 months, has lack of transportation kept you from  medical appointments or from getting medications?: No In the past 12 months, has lack of transportation kept you from meetings, work, or from getting things needed for daily living?: No  Home Assistive Devices / Equipment Home Equipment: Conservation officer, nature (2 wheels), Shower seat, Grab bars - tub/shower  Prior Device Use: Indicate devices/aids used by the patient prior to current illness, exacerbation or injury? Walker  Current Functional Level Cognition  Overall Cognitive Status: Within Functional Limits for tasks assessed Orientation Level: Oriented X4    Extremity Assessment (includes Sensation/Coordination)  Upper Extremity Assessment: Overall WFL for tasks assessed  Lower Extremity Assessment: RLE deficits/detail RLE Deficits / Details: knee ext -3/5, ankle DF -4/5, ankle PF +3/5 (pt wears bioness) RLE Sensation: WNL    ADLs  Overall ADL's : Needs assistance/impaired Eating/Feeding: Independent Eating/Feeding Details (indicate cue type and reason): sitting in recliner Grooming: Set up, Sitting Grooming Details (indicate cue type and reason): sitting in recliner Upper Body Bathing: Set up Upper Body Bathing Details (indicate cue type and reason): sitting in recliner Lower Body Bathing: Moderate assistance Lower Body Bathing Details (indicate cue type and reason): mod A sit<>stand Upper Body Dressing : Set up Upper Body Dressing Details (indicate cue type and reason): sitting in recliner Lower Body Dressing: Maximal assistance Lower Body Dressing Details (indicate cue type and reason): mod A sit<>stand Toilet Transfer: Minimal assistance, Ambulation, Rolling walker (2 wheels) Toilet Transfer Details (indicate cue type and reason): simulated bed> 8 feet to door>sit in recliner behind her Toileting- Water quality scientist and Hygiene: Moderate assistance Toileting - Clothing Manipulation Details (indicate cue type and reason): mod A sit<>stand    Mobility  Overal bed mobility:  Needs Assistance Bed Mobility: Supine to Sit Supine to sit: +2 for physical assistance, Max assist General bed mobility comments: assist to raise trunk and pivot hips to EOB    Transfers  Overall transfer level: Needs assistance Equipment used: Rolling walker (2 wheels) Transfers: Sit to/from Stand Sit to Stand: Mod assist, From elevated surface General transfer comment: VCs hand placement    Ambulation / Gait / Stairs / Wheelchair Mobility  Ambulation/Gait Ambulation/Gait assistance: Counsellor (Feet): 8 Feet Assistive device: Rolling walker (2 wheels) Gait Pattern/deviations: Step-to pattern, Wide base of support, Decreased step length - right, Decreased step length - left General Gait Details: VCs for 50%  PWB and to widen base of support, distance limited by pain/fatigue Gait velocity: decr    Posture / Balance Balance Overall balance assessment: Needs assistance, History of Falls Sitting-balance support: Feet supported, No upper extremity supported Sitting balance-Leahy Scale: Fair Standing balance support: Bilateral upper extremity supported Standing balance-Leahy Scale: Poor    Special needs/care consideration Special service needs none   Previous Home Environment (from acute therapy documentation) Living Arrangements: Spouse/significant other Available Help at Discharge: Family, Available 24 hours/day Type of Home: [place]: One level Home Access: Stairs to enter Entrance Stairs-Rails: Right, Left Entrance Stairs-Number of Steps: 4 Bathroom Shower/Tub: Multimedia programmer: Handicapped Candler: No Additional Comments: uses bioness for R foot drop  Discharge Living Setting Plans for Discharge Living Setting: Patient's home Type of Home at Discharge: House Discharge Home Layout: One level Discharge Home Access: Stairs to enter Entrance Stairs-Rails: Right, Left Entrance Stairs-Number of Steps: 4 Discharge  Bathroom Shower/Tub: Walk-in shower Discharge Bathroom Toilet: Handicapped height Discharge Bathroom Accessibility: Yes How Accessible: Accessible via walker Does the patient have any problems obtaining your medications?: No  Social/Family/Support Systems Patient Roles: Spouse Contact Information: [phone] Anticipated Caregiver: Wandy Read Anticipated Caregiver's Contact Information: [phone] Ability/Limitations of Caregiver: Min-mod A Caregiver Availability: 24/7 Discharge Plan Discussed with Primary Caregiver: Yes Is Caregiver In Agreement with Plan?: Yes Does Caregiver/Family have Issues with Lodging/Transportation while Pt is in Rehab?: No  Goals Patient/Family Goal for Rehab: PT/OT/SLP Min A Expected length of stay: 16-18 days Pt/Family Agrees to Admission and willing to participate: Yes Program Orientation Provided & Reviewed with Pt/Caregiver Including Roles  & Responsibilities: Yes  Decrease burden of Care through IP rehab admission: not anticipated   Possible need for SNF placement upon discharge: not anticipated  Patient Condition: I have reviewed medical records from [facility] , spoken with CM, and patient, spouse, and daughter. I discussed via phone for inpatient rehabilitation assessment.  Patient will benefit from ongoing PT and OT, can actively participate in 3 hours of therapy a day 5 days of the week, and can make measurable gains during the admission.  Patient will also benefit from the coordinated team approach during an Inpatient Acute Rehabilitation admission.  The patient will receive intensive therapy as well as Rehabilitation physician, nursing, social worker, and care management interventions.  Due to safety, skin/wound care, disease management, medication administration, pain management, and patient education the patient requires 24 hour a day rehabilitation nursing.  The patient is currently *** with mobility and basic ADLs.  Discharge  setting and therapy post discharge at home with home health is anticipated.  Patient has agreed to participate in the Acute Inpatient Rehabilitation Program and will admit {Time; today/tomorrow:10263}.  Preadmission Screen Completed By:  Genella Mech, 06/21/2022 11:12 AM ______________________________________________________________________   Discussed status with Dr. Marland Kitchen on *** at *** and received approval for admission today.  Admission Coordinator:  Genella Mech, CCC-SLP, time Marland KitchenSudie Grumbling ***   Assessment/Plan: Diagnosis: Does the need for close, 24 hr/day Medical supervision in concert with the patient's rehab needs make it unreasonable for this patient to be served in a less intensive setting? {yes_no_potentially:3041433} Co-Morbidities requiring supervision/potential complications: *** Due to {due RE:257123, does the patient require 24 hr/day rehab nursing? {yes_no_potentially:3041433} Does the patient require coordinated care of a physician, rehab nurse, PT, OT, and SLP to address physical and functional deficits in the context of the above medical diagnosis(es)? {yes_no_potentially:3041433} Addressing deficits in the following areas: {deficits:3041436} Can the patient  actively participate in an intensive therapy program of at least 3 hrs of therapy 5 days a week? {yes_no_potentially:3041433} The potential for patient to make measurable gains while on inpatient rehab is {potential:3041437} Anticipated functional outcomes upon discharge from inpatient rehab: {functional outcomes:304600100} PT, {functional outcomes:304600100} OT, {functional outcomes:304600100} SLP Estimated rehab length of stay to reach the above functional goals is: *** Anticipated discharge destination: {anticipated dc setting:21604} 10. Overall Rehab/Functional Prognosis: {potential:3041437}   MD Signature: ***

## 2022-06-21 NOTE — Plan of Care (Signed)
  Problem: Clinical Measurements: Goal: Ability to maintain clinical measurements within normal limits will improve Outcome: Progressing   Problem: Nutrition: Goal: Adequate nutrition will be maintained Outcome: Progressing   Problem: Pain Managment: Goal: General experience of comfort will improve Outcome: Progressing   

## 2022-06-22 ENCOUNTER — Encounter (HOSPITAL_COMMUNITY): Payer: Self-pay | Admitting: Physical Medicine and Rehabilitation

## 2022-06-22 ENCOUNTER — Other Ambulatory Visit: Payer: Self-pay

## 2022-06-22 ENCOUNTER — Inpatient Hospital Stay (HOSPITAL_COMMUNITY)
Admission: RE | Admit: 2022-06-22 | Discharge: 2022-07-01 | DRG: 559 | Disposition: A | Discharge status: Home / Self Care | Payer: Medicare Other | Source: Other Acute Inpatient Hospital | Attending: Physical Medicine and Rehabilitation | Admitting: Physical Medicine and Rehabilitation

## 2022-06-22 DIAGNOSIS — G4700 Insomnia, unspecified: Secondary | ICD-10-CM | POA: Diagnosis present

## 2022-06-22 DIAGNOSIS — S32401D Unspecified fracture of right acetabulum, subsequent encounter for fracture with routine healing: Secondary | ICD-10-CM | POA: Diagnosis not present

## 2022-06-22 DIAGNOSIS — Z8541 Personal history of malignant neoplasm of cervix uteri: Secondary | ICD-10-CM | POA: Diagnosis not present

## 2022-06-22 DIAGNOSIS — S32591A Other specified fracture of right pubis, initial encounter for closed fracture: Secondary | ICD-10-CM | POA: Diagnosis not present

## 2022-06-22 DIAGNOSIS — Z800 Family history of malignant neoplasm of digestive organs: Secondary | ICD-10-CM | POA: Diagnosis not present

## 2022-06-22 DIAGNOSIS — E871 Hypo-osmolality and hyponatremia: Secondary | ICD-10-CM

## 2022-06-22 DIAGNOSIS — G35 Multiple sclerosis: Secondary | ICD-10-CM | POA: Diagnosis present

## 2022-06-22 DIAGNOSIS — M25551 Pain in right hip: Secondary | ICD-10-CM | POA: Diagnosis not present

## 2022-06-22 DIAGNOSIS — S32409D Unspecified fracture of unspecified acetabulum, subsequent encounter for fracture with routine healing: Secondary | ICD-10-CM | POA: Diagnosis not present

## 2022-06-22 DIAGNOSIS — S0101XA Laceration without foreign body of scalp, initial encounter: Secondary | ICD-10-CM

## 2022-06-22 DIAGNOSIS — J189 Pneumonia, unspecified organism: Secondary | ICD-10-CM | POA: Diagnosis not present

## 2022-06-22 DIAGNOSIS — Z820 Family history of epilepsy and other diseases of the nervous system: Secondary | ICD-10-CM

## 2022-06-22 DIAGNOSIS — Z8781 Personal history of (healed) traumatic fracture: Secondary | ICD-10-CM

## 2022-06-22 DIAGNOSIS — D508 Other iron deficiency anemias: Secondary | ICD-10-CM

## 2022-06-22 DIAGNOSIS — W19XXXA Unspecified fall, initial encounter: Secondary | ICD-10-CM | POA: Diagnosis not present

## 2022-06-22 DIAGNOSIS — R32 Unspecified urinary incontinence: Secondary | ICD-10-CM | POA: Diagnosis not present

## 2022-06-22 DIAGNOSIS — Z90710 Acquired absence of both cervix and uterus: Secondary | ICD-10-CM | POA: Diagnosis not present

## 2022-06-22 DIAGNOSIS — S32592D Other specified fracture of left pubis, subsequent encounter for fracture with routine healing: Secondary | ICD-10-CM | POA: Diagnosis not present

## 2022-06-22 DIAGNOSIS — S32501A Unspecified fracture of right pubis, initial encounter for closed fracture: Secondary | ICD-10-CM | POA: Diagnosis not present

## 2022-06-22 DIAGNOSIS — J45991 Cough variant asthma: Secondary | ICD-10-CM | POA: Diagnosis present

## 2022-06-22 DIAGNOSIS — E039 Hypothyroidism, unspecified: Secondary | ICD-10-CM | POA: Diagnosis present

## 2022-06-22 DIAGNOSIS — Z96641 Presence of right artificial hip joint: Secondary | ICD-10-CM | POA: Diagnosis present

## 2022-06-22 DIAGNOSIS — Z888 Allergy status to other drugs, medicaments and biological substances status: Secondary | ICD-10-CM

## 2022-06-22 DIAGNOSIS — R918 Other nonspecific abnormal finding of lung field: Secondary | ICD-10-CM | POA: Diagnosis not present

## 2022-06-22 DIAGNOSIS — R338 Other retention of urine: Secondary | ICD-10-CM | POA: Diagnosis present

## 2022-06-22 DIAGNOSIS — S32591D Other specified fracture of right pubis, subsequent encounter for fracture with routine healing: Secondary | ICD-10-CM | POA: Diagnosis not present

## 2022-06-22 DIAGNOSIS — J440 Chronic obstructive pulmonary disease with acute lower respiratory infection: Secondary | ICD-10-CM | POA: Diagnosis not present

## 2022-06-22 DIAGNOSIS — S329XXA Fracture of unspecified parts of lumbosacral spine and pelvis, initial encounter for closed fracture: Principal | ICD-10-CM | POA: Diagnosis present

## 2022-06-22 DIAGNOSIS — K5909 Other constipation: Secondary | ICD-10-CM | POA: Diagnosis present

## 2022-06-22 DIAGNOSIS — S32401A Unspecified fracture of right acetabulum, initial encounter for closed fracture: Secondary | ICD-10-CM | POA: Diagnosis not present

## 2022-06-22 DIAGNOSIS — D509 Iron deficiency anemia, unspecified: Secondary | ICD-10-CM | POA: Diagnosis present

## 2022-06-22 DIAGNOSIS — E038 Other specified hypothyroidism: Secondary | ICD-10-CM | POA: Diagnosis not present

## 2022-06-22 DIAGNOSIS — M1990 Unspecified osteoarthritis, unspecified site: Secondary | ICD-10-CM | POA: Diagnosis present

## 2022-06-22 DIAGNOSIS — Z91041 Radiographic dye allergy status: Secondary | ICD-10-CM

## 2022-06-22 DIAGNOSIS — W1830XD Fall on same level, unspecified, subsequent encounter: Secondary | ICD-10-CM

## 2022-06-22 DIAGNOSIS — K5900 Constipation, unspecified: Secondary | ICD-10-CM | POA: Diagnosis not present

## 2022-06-22 DIAGNOSIS — J4489 Other specified chronic obstructive pulmonary disease: Secondary | ICD-10-CM | POA: Diagnosis present

## 2022-06-22 DIAGNOSIS — K219 Gastro-esophageal reflux disease without esophagitis: Secondary | ICD-10-CM | POA: Diagnosis present

## 2022-06-22 DIAGNOSIS — S32592A Other specified fracture of left pubis, initial encounter for closed fracture: Secondary | ICD-10-CM | POA: Diagnosis not present

## 2022-06-22 DIAGNOSIS — Z886 Allergy status to analgesic agent status: Secondary | ICD-10-CM

## 2022-06-22 DIAGNOSIS — R102 Pelvic and perineal pain: Secondary | ICD-10-CM | POA: Diagnosis not present

## 2022-06-22 DIAGNOSIS — Z87440 Personal history of urinary (tract) infections: Secondary | ICD-10-CM

## 2022-06-22 DIAGNOSIS — S32409A Unspecified fracture of unspecified acetabulum, initial encounter for closed fracture: Secondary | ICD-10-CM | POA: Diagnosis present

## 2022-06-22 DIAGNOSIS — M25552 Pain in left hip: Secondary | ICD-10-CM | POA: Diagnosis not present

## 2022-06-22 DIAGNOSIS — Z8249 Family history of ischemic heart disease and other diseases of the circulatory system: Secondary | ICD-10-CM

## 2022-06-22 DIAGNOSIS — F32A Depression, unspecified: Secondary | ICD-10-CM | POA: Diagnosis present

## 2022-06-22 DIAGNOSIS — Z885 Allergy status to narcotic agent status: Secondary | ICD-10-CM

## 2022-06-22 DIAGNOSIS — G4730 Sleep apnea, unspecified: Secondary | ICD-10-CM | POA: Diagnosis present

## 2022-06-22 DIAGNOSIS — M810 Age-related osteoporosis without current pathological fracture: Secondary | ICD-10-CM | POA: Diagnosis present

## 2022-06-22 DIAGNOSIS — R498 Other voice and resonance disorders: Secondary | ICD-10-CM | POA: Diagnosis present

## 2022-06-22 DIAGNOSIS — R059 Cough, unspecified: Secondary | ICD-10-CM | POA: Diagnosis not present

## 2022-06-22 MED ORDER — POLYETHYLENE GLYCOL 3350 17 G PO PACK
17.0000 g | PACK | Freq: Every day | ORAL | Status: DC
  Administered 2022-06-22 – 2022-06-27 (×6): 17 g via ORAL
  Filled 2022-06-22 (×6): qty 1

## 2022-06-22 MED ORDER — TRIMETHOPRIM 100 MG PO TABS
100.0000 mg | ORAL_TABLET | Freq: Every day | ORAL | Status: DC
  Administered 2022-06-22 – 2022-07-01 (×10): 100 mg via ORAL
  Filled 2022-06-22 (×10): qty 1

## 2022-06-22 MED ORDER — METHENAMINE MANDELATE 0.5 G PO TABS
1000.0000 mg | ORAL_TABLET | Freq: Two times a day (BID) | ORAL | Status: DC
  Administered 2022-06-22 – 2022-07-01 (×18): 1000 mg via ORAL
  Filled 2022-06-22 (×18): qty 2

## 2022-06-22 MED ORDER — ENOXAPARIN SODIUM 30 MG/0.3ML IJ SOSY: 30.0000 mg | PREFILLED_SYRINGE | INTRAMUSCULAR | Status: DC

## 2022-06-22 MED ORDER — FLUOXETINE HCL 20 MG PO CAPS
40.0000 mg | ORAL_CAPSULE | Freq: Every day | ORAL | Status: DC
  Administered 2022-06-22 – 2022-07-01 (×10): 40 mg via ORAL
  Filled 2022-06-22 (×10): qty 2

## 2022-06-22 MED ORDER — FERROUS SULFATE 325 (65 FE) MG PO TABS
325.0000 mg | ORAL_TABLET | Freq: Every day | ORAL | Status: DC
  Administered 2022-06-22 – 2022-06-30 (×9): 325 mg via ORAL
  Filled 2022-06-22 (×9): qty 1

## 2022-06-22 MED ORDER — LEVOTHYROXINE SODIUM 100 MCG PO TABS
100.0000 ug | ORAL_TABLET | Freq: Every day | ORAL | Status: DC
  Administered 2022-06-23 – 2022-07-01 (×9): 100 ug via ORAL
  Filled 2022-06-22 (×10): qty 1

## 2022-06-22 MED ORDER — NALOXONE HCL 0.4 MG/ML IJ SOLN: 0.4000 mg | INTRAMUSCULAR | Status: DC | PRN

## 2022-06-22 MED ORDER — LIDOCAINE 5 % EX PTCH
1.0000 | MEDICATED_PATCH | CUTANEOUS | Status: DC
  Filled 2022-06-22 (×2): qty 1

## 2022-06-22 MED ORDER — FENTANYL 25 MCG/HR TD PT72: 2.0000 | MEDICATED_PATCH | TRANSDERMAL | Status: DC

## 2022-06-22 MED ORDER — AMPHETAMINE-DEXTROAMPHETAMINE 10 MG PO TABS
20.0000 mg | ORAL_TABLET | Freq: Every day | ORAL | Status: DC
  Administered 2022-06-22 – 2022-07-01 (×10): 20 mg via ORAL
  Filled 2022-06-22 (×10): qty 2

## 2022-06-22 MED ORDER — KETOROLAC TROMETHAMINE 15 MG/ML IJ SOLN
15.0000 mg | Freq: Three times a day (TID) | INTRAMUSCULAR | Status: DC | PRN
  Administered 2022-06-22 – 2022-06-23 (×2): 15 mg via INTRAVENOUS
  Filled 2022-06-22 (×4): qty 1

## 2022-06-22 MED ORDER — ALBUTEROL SULFATE (2.5 MG/3ML) 0.083% IN NEBU: 3.0000 mL | INHALATION_SOLUTION | Freq: Four times a day (QID) | RESPIRATORY_TRACT | Status: DC | PRN

## 2022-06-22 MED ORDER — ENOXAPARIN SODIUM 30 MG/0.3ML IJ SOSY
30.0000 mg | PREFILLED_SYRINGE | INTRAMUSCULAR | Status: DC
  Administered 2022-06-22 – 2022-07-01 (×10): 30 mg via SUBCUTANEOUS
  Filled 2022-06-22 (×10): qty 0.3

## 2022-06-22 MED ORDER — DOCUSATE SODIUM 100 MG PO CAPS: 100.0000 mg | ORAL_CAPSULE | Freq: Every day | ORAL | 0 refills | Status: DC | PRN

## 2022-06-22 MED ORDER — PANTOPRAZOLE SODIUM 40 MG PO TBEC
40.0000 mg | DELAYED_RELEASE_TABLET | Freq: Every day | ORAL | Status: DC
  Administered 2022-06-22 – 2022-07-01 (×10): 40 mg via ORAL
  Filled 2022-06-22 (×10): qty 1

## 2022-06-22 MED ORDER — CALCIUM CARBONATE 1250 (500 CA) MG PO TABS: 1.0000 | ORAL_TABLET | Freq: Every day | ORAL | Status: AC

## 2022-06-22 MED ORDER — MIRABEGRON ER 25 MG PO TB24
25.0000 mg | ORAL_TABLET | Freq: Every day | ORAL | Status: DC
  Administered 2022-06-22 – 2022-06-26 (×5): 25 mg via ORAL
  Filled 2022-06-22 (×6): qty 1

## 2022-06-22 MED ORDER — TAMSULOSIN HCL 0.4 MG PO CAPS
0.4000 mg | ORAL_CAPSULE | Freq: Every day | ORAL | Status: DC
  Administered 2022-06-22 – 2022-07-01 (×10): 0.4 mg via ORAL
  Filled 2022-06-22 (×12): qty 1

## 2022-06-22 MED ORDER — FENTANYL CITRATE (PF) 100 MCG/2ML IJ SOLN: 12.5000 ug | INTRAMUSCULAR | Status: DC | PRN

## 2022-06-22 MED ORDER — DOCUSATE SODIUM 100 MG PO CAPS
100.0000 mg | ORAL_CAPSULE | Freq: Every day | ORAL | Status: DC | PRN
  Administered 2022-06-22: 100 mg via ORAL
  Filled 2022-06-22: qty 1

## 2022-06-22 MED ORDER — TEMAZEPAM 15 MG PO CAPS
15.0000 mg | ORAL_CAPSULE | Freq: Every day | ORAL | Status: DC
  Administered 2022-06-22 – 2022-06-30 (×9): 15 mg via ORAL
  Filled 2022-06-22 (×9): qty 1

## 2022-06-22 MED ORDER — LIDOCAINE 5 % EX PTCH: 1.0000 | MEDICATED_PATCH | CUTANEOUS | 0 refills | Status: DC

## 2022-06-22 MED ORDER — FAMOTIDINE 20 MG PO TABS
20.0000 mg | ORAL_TABLET | Freq: Every day | ORAL | Status: DC
  Administered 2022-06-22 – 2022-06-30 (×9): 20 mg via ORAL
  Filled 2022-06-22 (×9): qty 1

## 2022-06-22 MED ORDER — CALCIUM CARBONATE 1250 (500 CA) MG PO TABS
1.0000 | ORAL_TABLET | Freq: Every day | ORAL | Status: DC
  Administered 2022-06-23 – 2022-07-01 (×9): 1250 mg via ORAL
  Filled 2022-06-22 (×9): qty 1

## 2022-06-22 MED ORDER — FENTANYL 25 MCG/HR TD PT72: 2.0000 | MEDICATED_PATCH | TRANSDERMAL | 0 refills | Status: DC

## 2022-06-22 MED ORDER — FERROUS SULFATE 325 (65 FE) MG PO TABS: 325.0000 mg | ORAL_TABLET | Freq: Every day | ORAL | 3 refills | Status: DC

## 2022-06-22 MED ORDER — BACLOFEN 10 MG PO TABS
20.0000 mg | ORAL_TABLET | Freq: Every day | ORAL | Status: DC
  Administered 2022-06-22 – 2022-07-01 (×10): 20 mg via ORAL
  Filled 2022-06-22 (×11): qty 2

## 2022-06-22 NOTE — Progress Notes (Signed)
Inpatient Rehabilitation Admission Medication Review by a Pharmacist  A complete drug regimen review was completed for this patient to identify any potential clinically significant medication issues.  High Risk Drug Classes Is patient taking? Indication by Medication  Antipsychotic No   Anticoagulant Yes Enoxaparin - VTE ppx  Antibiotic Yes Trimethoprim - UTI ppx  Opioid Yes Fentanyl prn - pain   Antiplatelet No   Hypoglycemics/insulin No   Vasoactive Medication No   Chemotherapy No   Other Yes Adderall - ADHD  Baclofen - muscle spasm  Dexilant, famotidine - GERD Calcium carbonate, ferrous sulfate - suppl  Fluoxetine - mood disorder  Levothyroxine - hypothyroidism  Methenamine - recurrent UTIs Temazepam - sleep  Trelegy, albuterol - asthma  Xembify (IVIG) - MS  Mirabegron - bladder      Type of Medication Issue Identified Description of Issue Recommendation(s)  Drug Interaction(s) (clinically significant)     Duplicate Therapy     Allergy     No Medication Administration End Date     Incorrect Dose     Additional Drug Therapy Needed     Significant med changes from prior encounter (inform family/care partners about these prior to discharge).    Other       Clinically significant medication issues were identified that warrant physician communication and completion of prescribed/recommended actions by midnight of the next day:  No  Time spent performing this drug regimen review (minutes):  30  Francena Hanly, PharmD Pharmacy Resident  06/22/2022 10:39 AM

## 2022-06-22 NOTE — Progress Notes (Signed)
Patient arrived on unit transferred from [facility] vis transport. Skin assessment completed x2 RN;pt oriented to unit.

## 2022-06-22 NOTE — H&P (Addendum)
Physical Medicine and Rehabilitation Admission H&P    CC: Pubic rami and acetabular fractures  HPI: Kayla [name] is a 72 year old woman with a past medical history of MS, hypothyroidism, cough variant asthma, chronic constipation, iron deficiency anemia, recurrent UTIs, cervical cancer, osteoporosis, sleep apnea, and a history of a right hip replacement who presented to the ED from home after she had a mechanical fall. She reported hitting her head on the floor and also complained of right hip pain. She was found to have a full shaft width inferior right pubic rami fracture and associated nondisplaced right posterior acetabulum fracture. Orthopedic surgery was consulted and recommended that patient be weightbearing as tolerated with conservative care.  Review of Systems  Constitutional: Negative.   HENT: Negative.    Eyes: Negative.   Respiratory: Negative.    Cardiovascular:  Positive for leg swelling.  Gastrointestinal: Negative.   Genitourinary: Negative.   Musculoskeletal:  Positive for falls and joint pain.  Skin: Negative.   Neurological:  Positive for focal weakness.  Endo/Heme/Allergies: Negative.   Psychiatric/Behavioral:  The patient has insomnia.    +right hip pain Past Medical History:  Diagnosis Date   Arthritis    oa   Cervical cancer ([place]) 2005   COPD (chronic obstructive pulmonary disease) ([place])    Multiple sclerosis ([place])    Multiple sclerosis ([place]) 1996   Neuromuscular disorder ([place])    Osteoporosis    Other diseases of vocal cords    vocal cords are not even   Other voice and resonance disorders    Pneumonia 2014, 1992   Sleep apnea    no cpap used   Unspecified hypothyroidism    Past Surgical History:  Procedure Laterality Date   ABDOMINAL HYSTERECTOMY  03/25/2003   for cervical cancer, complete   BRONCHIAL BIOPSY  10/26/2020   Procedure: BRONCHIAL BIOPSIES;  Surgeon: Garner Nash, DO;  Location: [facility];  Service: Pulmonary;;   BRONCHIAL  BRUSHINGS  10/26/2020   Procedure: BRONCHIAL BRUSHINGS;  Surgeon: Garner Nash, DO;  Location: [facility];  Service: Pulmonary;;   BRONCHIAL WASHINGS  10/26/2020   Procedure: BRONCHIAL WASHINGS;  Surgeon: Garner Nash, DO;  Location: [facility];  Service: Pulmonary;;   CATARACT EXTRACTION, BILATERAL Bilateral    lens for cataracts   CONVERSION TO TOTAL HIP Right 09/18/2015   Procedure: CONVERSION OF RIGHT HEMI ARTHROPLASTY TO TOTAL HIP ARTHROPLASTY ACETABULAR REVISON ;  Surgeon: Paralee Cancel, MD;  Location: [facility];  Service: Orthopedics;  Laterality: Right;   HIP ARTHROPLASTY Right 03/01/2013   Procedure: ARTHROPLASTY BIPOLAR HIP;  Surgeon: Mauri Pole, MD;  Location: [facility];  Service: Orthopedics;  Laterality: Right;   HIP CLOSED REDUCTION Right 03/12/2013   Procedure: CLOSED REDUCTION HIP;  Surgeon: Gearlean Alf, MD;  Location: [facility];  Service: Orthopedics;  Laterality: Right;   HIP CLOSED REDUCTION Right 03/19/2013   Procedure: CLOSED REDUCTION HIP;  Surgeon: Johnn Hai, MD;  Location: [facility];  Service: Orthopedics;  Laterality: Right;   HIP CLOSED REDUCTION Right 11/10/2015   Procedure: CLOSED MANIPULATION HIP;  Surgeon: Rod Can, MD;  Location: [facility];  Service: Orthopedics;  Laterality: Right;   HIP CLOSED REDUCTION Right 06/05/2017   Procedure: CLOSED REDUCTION HIP;  Surgeon: [name], MD;  Location: [facility];  Service: Orthopedics;  Laterality: Right;   SMALL INTESTINE SURGERY     TOTAL HIP REVISION Right 06/06/2017   Procedure: TOTAL HIP REVISION;  Surgeon: [name], MD;  Location: [facility];  Service: Orthopedics;  Laterality: Right;   VESICOVAGINAL FISTULA CLOSURE W/ TAH  03/25/2003   VIDEO BRONCHOSCOPY N/A 10/26/2020   Procedure: VIDEO BRONCHOSCOPY WITH FLUORO;  Surgeon: Garner Nash, DO;  Location: [facility];  Service: Pulmonary;  Laterality: N/A;   Family History  Problem Relation Age of Onset   Other Mother        COVID   Colon  cancer Father    Atrial fibrillation Brother    Heart attack Maternal Grandfather    Parkinson's disease Paternal Grandfather    Esophageal cancer Neg Hx    Stomach cancer Neg Hx    Rectal cancer Neg Hx    Social History:  reports that she has never smoked. She has never used smokeless tobacco. She reports current alcohol use of about 2.0 standard drinks of alcohol per week. She reports that she does not use drugs. Allergies:  Allergies  Allergen Reactions   Tramadol     hallucinations    Contrast Media [Iodinated Contrast Media] Rash   Iohexol Hives   Morphine And Related Other (See Comments)    Reaction:  Hallucinations    Methylprednisolone Other (See Comments)    Reaction:  Decreases pts heart rate    Hydrocodone Itching   Oxycodone-Acetaminophen Rash and Other (See Comments)    Reaction:  Hallucinations    Phenytoin Sodium Extended Rash   Zanaflex [Tizanidine Hydrochloride] Rash   No medications prior to admission.    Home: Home Living Family/patient expects to be discharged to:: Private residence Living Arrangements: Spouse/significant other Available Help at Discharge: Family, Available 24 hours/day Type of Home: House Home Access: Stairs to enter [employer] of Steps: 4 Entrance Stairs-Rails: Right, Left Home Layout: One level Bathroom Shower/Tub: Multimedia programmer: Handicapped height Home Equipment: Conservation officer, nature (2 wheels), Shower seat, Grab bars - tub/shower Additional Comments: uses bioness for R foot drop   Functional History: Prior Function Prior Level of Function : Driving, Independent/Modified Independent Mobility Comments: walks with RW, no other falls in past 6 months ADLs Comments: self catheterizes, 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 Status:  Mobility: [place] bed mobility: Needs Assistance Bed Mobility: Sit to Supine Supine to sit: Min assist Sit to supine: Min  assist General bed mobility comments: assist to lift bil LEs on to bed Transfers Overall transfer level: Needs assistance Equipment used: Rolling walker (2 wheels) Transfers: Sit to/from Stand Sit to Stand: Min assist General transfer comment: VCs hand placement Ambulation/Gait Ambulation/Gait assistance: Min guard Gait Distance (Feet): 12 Feet (x2) Assistive device: Rolling walker (2 wheels) Gait Pattern/deviations: Step-to pattern, Decreased step length - right, Decreased step length - left, Narrow base of support General Gait Details: VCs for 50% PWB and to widen base of support, distance limited by pain/fatigue Gait velocity: decr   ADL: ADL Overall ADL's : Needs assistance/impaired Eating/Feeding: Independent Eating/Feeding Details (indicate cue type and reason): sitting in recliner Grooming: Set up, Sitting Grooming Details (indicate cue type and reason): sitting in recliner Upper Body Bathing: Set up Upper Body Bathing Details (indicate cue type and reason): sitting in recliner Lower Body Bathing: Moderate assistance Lower Body Bathing Details (indicate cue type and reason): mod A sit<>stand Upper Body Dressing : Set up Upper Body Dressing Details (indicate cue type and reason): sitting in recliner Lower Body Dressing: Maximal assistance Lower Body Dressing Details (indicate cue type and reason): mod A sit<>stand Toilet Transfer: Minimal assistance, Ambulation, Rolling walker (  2 wheels) Toilet Transfer Details (indicate cue type and reason): to transfer to recliner in room. patient declined to use bathroom on this date Toileting- Water quality scientist and Hygiene: Minimal assistance, Sit to/from stand Toileting - Clothing Manipulation Details (indicate cue type and reason): with education on how to maintain WB restrictions in standing. patient was able to complete hygiene and clothing managemnet with increased time.   Cognition: Cognition Overall Cognitive Status: Within  Functional Limits for tasks assessed Orientation Level: Oriented X4 Cognition Arousal/Alertness: Awake/alert Behavior During Therapy: WFL for tasks assessed/performed Overall Cognitive Status: Within Functional Limits for tasks assessed General Comments: patient had husband, daughter and grandchildren present during session   Physical Exam: Blood pressure 133/63, pulse 73, temperature 99.8 F (37.7 C), temperature source Oral, resp. rate 18, SpO2 92 %. Physical Exam Gen: no distress, normal appearing HEENT: oral mucosa pink and moist, NCAT Cardio: Reg rate Chest: normal effort, normal rate of breathing Abd: soft, non-distended Ext: no edema Psych: pleasant, normal affect Skin: intact Neuro: Alert and oriented x3. No focal neurological deficits.   Physical Exam: There were no vitals taken for this visit. Physical Exam Gen: no distress, normal appearing HEENT: oral mucosa pink and moist, NCAT Cardio: Reg rate Chest: normal effort, normal rate of breathing Abd: soft, non-distended Ext: no edema Psych: pleasant, normal affect Skin: intact Neuro: Alert and oriented x3. No focal neurological deficits.   Results for orders placed or performed during the hospital encounter of 06/19/22 (from the past 48 hour(s))  Basic metabolic panel     Status: Abnormal   Collection Time: 06/21/22  3:32 AM  Result Value Ref Range   Sodium 130 (L) 135 - 145 mmol/L   Potassium 4.5 3.5 - 5.1 mmol/L   Chloride 100 98 - 111 mmol/L   CO2 24 22 - 32 mmol/L   Glucose, Bld 101 (H) 70 - 99 mg/dL    Comment: Glucose reference range applies only to samples taken after fasting for at least 8 hours.   BUN 16 8 - 23 mg/dL   Creatinine, Ser 0.73 0.44 - 1.00 mg/dL   Calcium 8.7 (L) 8.9 - 10.3 mg/dL   GFR, Estimated >60 >60 mL/min    Comment: (NOTE) Calculated using the CKD-EPI Creatinine Equation (2021)    Anion gap 6 5 - 15    Comment: Performed at [facility], [place] [address]  [place], [place] [address]   No results found.    There were no vitals taken for this visit.  Medical Problem List and Plan: 1. Functional deficits secondary to inferior pubic rami fracture and nondisplaced right anterior and posterior acetabular fractures.  -patient may shower  -ELOS/Goals: 8-12 days modI  Admit to CIR 2.  Antithrombotics: -DVT/anticoagulation:  Mechanical: Sequential compression devices, below knee Bilateral lower extremities and Lovenox 38meq daily.  3. Pain form multiple fractures: continue fentanyl patch.  4. Insomnia: continue temazepam.  5. Neuropsych/cognition: This patient is capable of making decisions on her own behalf. 6. Iron deficiency anemia: continue daily iron supplement 7. Hypothyroidism: continue Synthroid 8. MS: continue home baclofen.  9. GERD: continue PPI and H2 blocker 10. Depression: continue daily prozac.  11. Chronic urinary retention 2/2 MS: patient self-caths q12H at home, can resume this schedule, placed nursing order  I have personally performed a face to face diagnostic evaluation, including, but not limited to relevant history and physical exam findings, of this patient and developed relevant assessment and plan.  Additionally, I have reviewed and concur  with the physician assistant's documentation above.   Izora Ribas, MD 06/22/2022

## 2022-06-22 NOTE — Progress Notes (Signed)
Late entry call note: Nurse Marcello Fennel RN called me tonight around 7:30pm, stated patient had orders for cath q8h and prior cath was at 6:30pm-- pt and family concerned with having to be woken up at 2:30am for next cath. Pt usually self caths q12h at home, and while she was admitted. Agree that disrupting sleep would be suboptimal, and since that schedule is what she's used to, advised letting her go the 12hrs, putting the next cath at 6:30am.  Order left for q8h caths for now, but discussed with nurse that weekday team will readdress it in the morning and decide best timing for ongoing caths/orders.   [address] Fahey, [employer]

## 2022-06-22 NOTE — TOC Transition Note (Signed)
Transition of Care [facility]) - CM/SW Discharge Note   Patient Details  Name: Kayla [name] MRN: [id number] Date of Birth: 02/02/1951  [facility]) CM/SW Contact:  Henrietta Dine, RN Phone Number: 06/22/2022, 8:55 AM   Clinical Narrative:    Pt transfer to CIR; transport by [facility]. EMTALA completed by RN; no TOC needs.  Final next level of care: IP Rehab Facility Barriers to Discharge: No Barriers Identified   Patient Goals and CMS Choice      Discharge Placement                         Discharge Plan and Services Additional resources added to the After Visit Summary for   In-house Referral: Clinical Social Work                                   Social Determinants of Health (SDOH) Interventions SDOH Screenings   Food Insecurity: No Food Insecurity (05/22/2022)  Housing: Low Risk  (05/22/2022)  Transportation Needs: No Transportation Needs (05/22/2022)  Alcohol Screen: Low Risk  (10/04/2021)  Depression (PHQ2-9): Low Risk  (05/22/2022)  Financial Resource Strain: Low Risk  (10/04/2021)  Physical Activity: Insufficiently Active (10/04/2021)  Social Connections: Moderately Isolated (10/04/2021)  Stress: No Stress Concern Present (10/04/2021)  Tobacco Use: Low Risk  (06/19/2022)     Readmission Risk Interventions    06/20/2022    3:58 PM  Readmission Risk Prevention Plan  Post Dischage Appt Complete  Medication Screening Complete  Transportation Screening Complete

## 2022-06-22 NOTE — Progress Notes (Signed)
Spoke with on call [employer], [place] reqarding patient prefers of being awaken q8 for I/O craterization.. Patient states she has been self cath.every 12 hrs at home and while at [facility].Obtain an order to cath patient  Q 12 hrs and patient was informed.medical team to evaluate this in the morning per on call.

## 2022-06-22 NOTE — H&P (Signed)
Physical Medicine and Rehabilitation Admission H&P    Chief Complaint  Patient presents with   Fall  : HPI: Kayla [name] is a 72 year old woman with a past medical history of MS, hypothyroidism, cough variant asthma, chronic constipation, iron deficiency anemia, recurrent UTIs, cervical cancer, osteoporosis, sleep apnea, and a history of a right hip replacement who presented to the ED from home after she had a mechanical fall. She reported hitting her head on the floor and also complained of right hip pain. She was found to have a full shaft width inferior right pubic rami fracture and associated nondisplaced right posterior acetabulum fracture. Orthopedic surgery was consulted and recommended that patient be weightbearing as tolerated with conservative care.  ROS +right hip pain Past Medical History:  Diagnosis Date   Arthritis    oa   Cervical cancer ([place]) 2005   COPD (chronic obstructive pulmonary disease) ([place])    Multiple sclerosis ([place])    Multiple sclerosis ([place]) 1996   Neuromuscular disorder ([place])    Osteoporosis    Other diseases of vocal cords    vocal cords are not even   Other voice and resonance disorders    Pneumonia 2014, 1992   Sleep apnea    no cpap used   Unspecified hypothyroidism    Past Surgical History:  Procedure Laterality Date   ABDOMINAL HYSTERECTOMY  03/25/2003   for cervical cancer, complete   BRONCHIAL BIOPSY  10/26/2020   Procedure: BRONCHIAL BIOPSIES;  Surgeon: Garner Nash, DO;  Location: [facility];  Service: Pulmonary;;   BRONCHIAL BRUSHINGS  10/26/2020   Procedure: BRONCHIAL BRUSHINGS;  Surgeon: Garner Nash, DO;  Location: [facility];  Service: Pulmonary;;   BRONCHIAL WASHINGS  10/26/2020   Procedure: BRONCHIAL WASHINGS;  Surgeon: Garner Nash, DO;  Location: [facility];  Service: Pulmonary;;   CATARACT EXTRACTION, BILATERAL Bilateral    lens for cataracts   CONVERSION TO TOTAL HIP Right 09/18/2015   Procedure:  CONVERSION OF RIGHT HEMI ARTHROPLASTY TO TOTAL HIP ARTHROPLASTY ACETABULAR REVISON ;  Surgeon: Paralee Cancel, MD;  Location: [facility];  Service: Orthopedics;  Laterality: Right;   HIP ARTHROPLASTY Right 03/01/2013   Procedure: ARTHROPLASTY BIPOLAR HIP;  Surgeon: Mauri Pole, MD;  Location: [facility];  Service: Orthopedics;  Laterality: Right;   HIP CLOSED REDUCTION Right 03/12/2013   Procedure: CLOSED REDUCTION HIP;  Surgeon: Gearlean Alf, MD;  Location: [facility];  Service: Orthopedics;  Laterality: Right;   HIP CLOSED REDUCTION Right 03/19/2013   Procedure: CLOSED REDUCTION HIP;  Surgeon: Johnn Hai, MD;  Location: [facility];  Service: Orthopedics;  Laterality: Right;   HIP CLOSED REDUCTION Right 11/10/2015   Procedure: CLOSED MANIPULATION HIP;  Surgeon: Rod Can, MD;  Location: [facility];  Service: Orthopedics;  Laterality: Right;   HIP CLOSED REDUCTION Right 06/05/2017   Procedure: CLOSED REDUCTION HIP;  Surgeon: [name], MD;  Location: [facility];  Service: Orthopedics;  Laterality: Right;   SMALL INTESTINE SURGERY     TOTAL HIP REVISION Right 06/06/2017   Procedure: TOTAL HIP REVISION;  Surgeon: [name], MD;  Location: [facility];  Service: Orthopedics;  Laterality: Right;   VESICOVAGINAL FISTULA CLOSURE W/ TAH  03/25/2003   VIDEO BRONCHOSCOPY N/A 10/26/2020   Procedure: VIDEO BRONCHOSCOPY WITH FLUORO;  Surgeon: Garner Nash, DO;  Location: [facility];  Service: Pulmonary;  Laterality: N/A;   Family History  Problem Relation Age of Onset   Other Mother  COVID   Colon cancer Father    Atrial fibrillation Brother    Heart attack Maternal Grandfather    Parkinson's disease Paternal Grandfather    Esophageal cancer Neg Hx    Stomach cancer Neg Hx    Rectal cancer Neg Hx    Social History:  reports that she has never smoked. She has never used smokeless tobacco. She reports current alcohol use of about 2.0 standard drinks of alcohol per week. She reports that she  does not use drugs. Allergies:  Allergies  Allergen Reactions   Tramadol     hallucinations    Contrast Media [Iodinated Contrast Media] Rash   Iohexol Hives   Morphine And Related Other (See Comments)    Reaction:  Hallucinations    Methylprednisolone Other (See Comments)    Reaction:  Decreases pts heart rate    Hydrocodone Itching   Oxycodone-Acetaminophen Rash and Other (See Comments)    Reaction:  Hallucinations    Phenytoin Sodium Extended Rash   Zanaflex [Tizanidine Hydrochloride] Rash   Medications Prior to Admission  Medication Sig Dispense Refill   albuterol (VENTOLIN HFA) 108 (90 Base) MCG/ACT inhaler INHALE 2 PUFFS INTO LUNGS EVERY 6 HOURS AS NEEDED. (Patient taking differently: Inhale 2 puffs into the lungs every 6 (six) hours as needed for wheezing or shortness of breath.) 18 g 0   amphetamine-dextroamphetamine (ADDERALL) 20 MG tablet Take 1 tablet (20 mg total) by mouth daily. 30 tablet 0   baclofen (LIORESAL) 20 MG tablet Take 20 mg by mouth in the morning.     CALCIUM PO Take 1 tablet by mouth daily.     dexlansoprazole (DEXILANT) 60 MG capsule TAKE 1 CAPSULE BY MOUTH  ONCE DAILY WITH BREAKFAST 90 capsule 0   famotidine (PEPCID) 20 MG tablet One after supper (Patient taking differently: Take 20 mg by mouth at bedtime. One after supper) 30 tablet 11   Ferric Maltol (ACCRUFER) 30 MG CAPS Take 1 capsule by mouth in the morning and at bedtime. 180 capsule 0   FLUoxetine (PROZAC) 40 MG capsule Take 1 capsule (40 mg total) by mouth daily. 90 capsule 1   Fluticasone-Umeclidin-Vilant (TRELEGY ELLIPTA) 100-62.5-25 MCG/ACT AEPB Inhale 1 puff into the lungs daily. 120 each 1   furosemide (LASIX) 20 MG tablet TAKE 1 TABLET BY MOUTH DAILY 90 tablet 3   levothyroxine (SYNTHROID) 100 MCG tablet TAKE 1 TABLET BY MOUTH ONCE  DAILY BEFORE BREAKFAST 90 tablet 3   methenamine (HIPREX) 1 g tablet Take 1 tablet (1 g total) by mouth 2 (two) times daily with a meal. 180 tablet 1   Multiple  Vitamin (MULTIVITAMIN WITH MINERALS) TABS tablet Take 1 tablet by mouth daily.     Probiotic Product (PROBIOTIC PO) Take 1 capsule by mouth daily. Harmony probiotic     sodium chloride HYPERTONIC 3 % nebulizer solution Take by nebulization 2 (two) times daily as needed for cough. 750 mL 1   tamsulosin (FLOMAX) 0.4 MG CAPS capsule Take 0.4 mg by mouth daily.     temazepam (RESTORIL) 15 MG capsule Take 1 capsule (15 mg total) by mouth at bedtime. 30 capsule 1   trimethoprim (TRIMPEX) 100 MG tablet Take 100 mg by mouth daily.     Vibegron (GEMTESA) 75 MG TABS Take 1 tablet by mouth in the morning.     Immune Globulin, Human,-klhw (XEMBIFY [facility]) Inject into the skin.     Respiratory Therapy Supplies (FLUTTER) DEVI Use as directed 1 each 0  Home: Home Living Family/patient expects to be discharged to:: Private residence Living Arrangements: Spouse/significant other Available Help at Discharge: Family, Available 24 hours/day Type of Home: House Home Access: Stairs to enter [employer] of Steps: 4 Entrance Stairs-Rails: Right, Left Home Layout: One level Bathroom Shower/Tub: Multimedia programmer: Handicapped height Home Equipment: Conservation officer, nature (2 wheels), Shower seat, Grab bars - tub/shower Additional Comments: uses bioness for R foot drop   Functional History: Prior Function Prior Level of Function : Driving, Independent/Modified Independent Mobility Comments: walks with RW, no other falls in past 6 months ADLs Comments: self catheterizes, works part time Web designer for a [employer] Status:  Mobility: [place] bed mobility: Needs Assistance Bed Mobility: Sit to Supine Supine to sit: Min assist Sit to supine: Min assist General bed mobility comments: assist to lift bil LEs on to bed Transfers Overall transfer level: Needs assistance Equipment used: Rolling walker (2 wheels) Transfers: Sit to/from Stand Sit to Stand: Min  assist General transfer comment: VCs hand placement Ambulation/Gait Ambulation/Gait assistance: Min guard Gait Distance (Feet): 12 Feet (x2) Assistive device: Rolling walker (2 wheels) Gait Pattern/deviations: Step-to pattern, Decreased step length - right, Decreased step length - left, Narrow base of support General Gait Details: VCs for 50% PWB and to widen base of support, distance limited by pain/fatigue Gait velocity: decr    ADL: ADL Overall ADL's : Needs assistance/impaired Eating/Feeding: Independent Eating/Feeding Details (indicate cue type and reason): sitting in recliner Grooming: Set up, Sitting Grooming Details (indicate cue type and reason): sitting in recliner Upper Body Bathing: Set up Upper Body Bathing Details (indicate cue type and reason): sitting in recliner Lower Body Bathing: Moderate assistance Lower Body Bathing Details (indicate cue type and reason): mod A sit<>stand Upper Body Dressing : Set up Upper Body Dressing Details (indicate cue type and reason): sitting in recliner Lower Body Dressing: Maximal assistance Lower Body Dressing Details (indicate cue type and reason): mod A sit<>stand Toilet Transfer: Minimal assistance, Ambulation, Rolling walker (2 wheels) Toilet Transfer Details (indicate cue type and reason): to transfer to recliner in room. patient declined to use bathroom on this date Toileting- Water quality scientist and Hygiene: Minimal assistance, Sit to/from stand Toileting - Clothing Manipulation Details (indicate cue type and reason): with education on how to maintain WB restrictions in standing. patient was able to complete hygiene and clothing managemnet with increased time.  Cognition: Cognition Overall Cognitive Status: Within Functional Limits for tasks assessed Orientation Level: Oriented X4 Cognition Arousal/Alertness: Awake/alert Behavior During Therapy: WFL for tasks assessed/performed Overall Cognitive Status: Within Functional  Limits for tasks assessed General Comments: patient had husband, daughter and grandchildren present during session  Physical Exam: Blood pressure 133/63, pulse 73, temperature 99.8 F (37.7 C), temperature source Oral, resp. rate 18, SpO2 92 %. Physical Exam Gen: no distress, normal appearing HEENT: oral mucosa pink and moist, NCAT Cardio: Reg rate Chest: normal effort, normal rate of breathing Abd: soft, non-distended Ext: no edema Psych: pleasant, normal affect Skin: intact Neuro: Alert and oriented x3. No focal neurological deficits.   Results for orders placed or performed during the hospital encounter of 06/19/22 (from the past 48 hour(s))  Basic metabolic panel     Status: Abnormal   Collection Time: 06/21/22  3:32 AM  Result Value Ref Range   Sodium 130 (L) 135 - 145 mmol/L   Potassium 4.5 3.5 - 5.1 mmol/L   Chloride 100 98 - 111 mmol/L   CO2 24 22 - 32 mmol/L  Glucose, Bld 101 (H) 70 - 99 mg/dL    Comment: Glucose reference range applies only to samples taken after fasting for at least 8 hours.   BUN 16 8 - 23 mg/dL   Creatinine, Ser 0.73 0.44 - 1.00 mg/dL   Calcium 8.7 (L) 8.9 - 10.3 mg/dL   GFR, Estimated >60 >60 mL/min    Comment: (NOTE) Calculated using the CKD-EPI Creatinine Equation (2021)    Anion gap 6 5 - 15    Comment: Performed at [facility], [place] [address] [place], [place] [address]   No results found.    Blood pressure 133/63, pulse 73, temperature 99.8 F (37.7 C), temperature source Oral, resp. rate 18, SpO2 92 %.  Medical Problem List and Plan: 1. Functional deficits secondary to inferior pubic rami fracture and nondisplaced right anterior and posterior acetabular fractures.  -patient may shower  -ELOS/Goals: 8-12 days modI  Admit to CIR 2.  Antithrombotics: -DVT/anticoagulation:  Mechanical: Sequential compression devices, below knee Bilateral lower extremities and Lovenox 79meq daily.  3. Pain form multiple fractures:  continue fentanyl patch.  4. Insomnia: continue temazepam.  5. Neuropsych/cognition: This patient is capable of making decisions on her own behalf. 6. Iron deficiency anemia: continue daily iron supplement 7. Hypothyroidism: continue Synthroid 8. MS: continue home baclofen.  9. GERD: continue PPI and H2 blocker 10. Depression: continue daily prozac.   I have personally performed a face to face diagnostic evaluation, including, but not limited to relevant history and physical exam findings, of this patient and developed relevant assessment and plan.  Additionally, I have reviewed and concur with the physician assistant's documentation above.   Izora Ribas, MD 06/22/2022

## 2022-06-22 NOTE — Discharge Summary (Signed)
Physician Discharge Summary   Patient: Kayla [name] MRN: [id number] DOB: November 16, 1950  Admit date:     06/19/2022  Discharge date: 06/22/22  Discharge Physician: Lorella Nimrod   PCP: Janith Lima, MD   Recommendations at discharge:  Please obtain CBC and BMP in 1 week Follow-up with orthopedic surgery Follow-up with primary care provider  Discharge Diagnoses: Principal Problem:   Pelvic fracture [facility]) Active Problems:   Hypokalemia   Hypothyroidism   Multiple sclerosis (HCC)   Cough variant asthma vs vcd  related to MS    Hyponatremia   Iron deficiency anemia secondary to inadequate dietary iron intake   GERD (gastroesophageal reflux disease)   Fall   Scalp laceration   Hospital Course: Taken from H&P.  Kayla [name] is a 72 y.o. female with medical history significant of MS, hypothyroidism, cough variant asthma versus vocal cord dysfunction related to MS, chronic constipation, iron deficiency anemia, recurrent UTIs, cervical cancer, osteoporosis, sleep apnea, history of right hip replacement presented to the ED from home after a mechanical fall.  Patient reported hitting her head on the floor and also complained of right hip pain.   Patient states she has MS and normally uses a walker to ambulate but today she had to make a short trip to the kitchen and did not have her walker with her when she lost her balance and fell on her right side. She reports hitting her head on the floor but denies loss of consciousness.    Vital signs stable.  Labs significant for no leukocytosis, hemoglobin 11.0 (was 11.8 on 05/26/2022 and previously 12-13 on labs done 5-6 months ago), potassium 3.2, calcium 6.8 (albumin level not checked), UA pending.  Chest x-ray showing mild left basilar atelectasis.  X-ray of right hip/pelvis showing evidence of prosthetic dislocation but no acute fracture.  CT head/C-spine negative for acute finding.   CT of right hip without contrast showing: "IMPRESSION: 1.  Full shaft width inferior right pubic rami fracture. Associated nondisplaced right posterior acetabulum fracture. Suggestion of an acute nondisplaced anterior right acetabular fracture 2. Total right hip arthroplasty. No CT finding to suggest surgical hardware complication. 3. Foci of subcutaneus soft tissue emphysema along the anterior right pelvis as well as multiple foci of emphysema along the right proximal medial thigh of unclear etiology. Finding may be related to infection versus trauma more distally.  3/29: Vitals stable.  Orthopedic surgery was consulted and they were recommending 50% weightbearing on right lower extremity with conservative management as she has right pubic rami fracture which does not involve the prosthetic or acetabulum.  Labs with stable hemoglobin, mild hyponatremia where sodium decreased to 130. PT and OT are recommending CIR. Continue to have significant pain -adding Toradol and can convert fentanyl patch.  3/30: Vital stable.  Labs with stable sodium at 130.  Per patient fentanyl patch seems helping, lidocaine patch does not cause much relief.  Had a bed offer for CIR tomorrow.  3/31: Hemodynamically stable.  Having pain this morning.  Increasing the dose of fentanyl patch as this seems working.  Patient will continue with current medications and is being discharged to acute rehab for further management.  She will follow-up with her providers for further recommendations.    Assessment and Plan: * Pelvic fracture ([place]) Inferior pubic rami fracture Nondisplaced right anterior and posterior acetabular fractures Secondary to a mechanical fall.  Orthopedic surgery was consulted and they were recommending conservative management.  Continue to have pain Patient will be  weightbearing as tolerated PT/OT recommending CIR-patient can go tomorrow -Continue with pain management-fentanyl patches seems working better. -Also added Toradol -Continue with supportive  care  Hypokalemia Resolved.  Magnesium was normal -Monitor potassium and replace as needed  Hypothyroidism -Continue Synthroid  Multiple sclerosis (HCC) Stable.  She was previously on Octrevus which was stopped last year due to recurrent infections.  She is now on weekly IVIG per immunology.   -Continue home baclofen.   Cough variant asthma vs vcd  related to MS  No acute concern -Continue home meds  Hyponatremia Patient has chronically intermittent hyponatremia which was initially thought to be due to either related to MS or MS meds.  Currently seems stable -Monitor sodium  Iron deficiency anemia secondary to inadequate dietary iron intake Hemoglobin seems stable. -Continue home iron supplement  GERD (gastroesophageal reflux disease) -Continue PPI and H2 blocker   Pain control - [place] Controlled Substance Reporting System database was reviewed. and patient was instructed, not to drive, operate heavy machinery, perform activities at heights, swimming or participation in water activities or provide baby-sitting services while on Pain, Sleep and Anxiety Medications; until their outpatient Physician has advised to do so again. Also recommended to not to take more than prescribed Pain, Sleep and Anxiety Medications.  Consultants: Orthopedic surgery Procedures performed: None Disposition: Rehabilitation facility Diet recommendation:  Discharge Diet Orders (From admission, onward)     Start     Ordered   06/22/22 0000  Diet - low sodium heart healthy        06/22/22 0920           Regular diet DISCHARGE MEDICATION: Allergies as of 06/22/2022       Reactions   Tramadol    hallucinations    Contrast Media [iodinated Contrast Media] Rash   Iohexol Hives   Morphine And Related Other (See Comments)   Reaction:  Hallucinations    Methylprednisolone Other (See Comments)   Reaction:  Decreases pts heart rate    Hydrocodone Itching   Oxycodone-acetaminophen Rash,  Other (See Comments)   Reaction:  Hallucinations    Phenytoin Sodium Extended Rash   Zanaflex [tizanidine Hydrochloride] Rash        Medication List     STOP taking these medications    CALCIUM PO       TAKE these medications    ACCRUFeR 30 MG Caps Generic drug: Ferric Maltol Take 1 capsule by mouth in the morning and at bedtime.   albuterol 108 (90 Base) MCG/ACT inhaler Commonly known as: Ventolin HFA INHALE 2 PUFFS INTO LUNGS EVERY 6 HOURS AS NEEDED. What changed:  how much to take how to take this when to take this reasons to take this additional instructions   amphetamine-dextroamphetamine 20 MG tablet Commonly known as: ADDERALL Take 1 tablet (20 mg total) by mouth daily.   baclofen 20 MG tablet Commonly known as: LIORESAL Take 20 mg by mouth in the morning.   calcium carbonate 1250 (500 Ca) MG tablet Commonly known as: OS-CAL - dosed in mg of elemental calcium Take 1 tablet (1,250 mg total) by mouth daily with breakfast.   dexlansoprazole 60 MG capsule Commonly known as: Dexilant TAKE 1 CAPSULE BY MOUTH  ONCE DAILY WITH BREAKFAST   docusate sodium 100 MG capsule Commonly known as: COLACE Take 1 capsule (100 mg total) by mouth daily as needed for mild constipation.   famotidine 20 MG tablet Commonly known as: Pepcid One after supper What changed:  how much to  take how to take this when to take this   fentaNYL 25 MCG/HR Commonly known as: [place] 2 patches onto the skin every other day.   ferrous sulfate 325 (65 FE) MG tablet Take 1 tablet (325 mg total) by mouth daily with supper.   FLUoxetine 40 MG capsule Commonly known as: PROZAC Take 1 capsule (40 mg total) by mouth daily.   Flutter Devi Use as directed   furosemide 20 MG tablet Commonly known as: LASIX TAKE 1 TABLET BY MOUTH DAILY   Gemtesa 75 MG Tabs Generic drug: Vibegron Take 1 tablet by mouth in the morning.   levothyroxine 100 MCG tablet Commonly known as:  SYNTHROID TAKE 1 TABLET BY MOUTH ONCE  DAILY BEFORE BREAKFAST   lidocaine 5 % Commonly known as: LIDODERM Place 1 patch onto the skin daily. Remove & Discard patch within 12 hours or as directed by MD   methenamine 1 g tablet Commonly known as: HIPREX Take 1 tablet (1 g total) by mouth 2 (two) times daily with a meal.   multivitamin with minerals Tabs tablet Take 1 tablet by mouth daily.   PROBIOTIC PO Take 1 capsule by mouth daily. Harmony probiotic   sodium chloride HYPERTONIC 3 % nebulizer solution Take by nebulization 2 (two) times daily as needed for cough.   tamsulosin 0.4 MG Caps capsule Commonly known as: FLOMAX Take 0.4 mg by mouth daily.   temazepam 15 MG capsule Commonly known as: RESTORIL Take 1 capsule (15 mg total) by mouth at bedtime.   Trelegy Ellipta 100-62.5-25 MCG/ACT Aepb Generic drug: Fluticasone-Umeclidin-Vilant Inhale 1 puff into the lungs daily.   trimethoprim 100 MG tablet Commonly known as: TRIMPEX Take 100 mg by mouth daily.   XEMBIFY  Inject into the skin.               Discharge Care Instructions  (From admission, onward)           Start     Ordered   06/22/22 0000  No dressing needed        06/22/22 0920            Follow-up Information     [facility] .   Specialty: Emergency Medicine Why: for staple removal Contact information: [place] [id number] [place] [id number] [phone]        Paralee Cancel, MD. Schedule an appointment as soon as possible for a visit in 3 week(s).   Specialty: Orthopedic Surgery Why: For X-ray follow up Contact information: [address] [place] [id number]         Janith Lima, MD. Schedule an appointment as soon as possible for a visit.   Specialty: Internal Medicine Contact information: [place] [address] [phone]                 Discharge Exam: There were no vitals filed for this visit. General.     In no acute distress. Pulmonary.  Lungs clear bilaterally, normal respiratory effort. CV.  Regular rate and rhythm, no JVD, rub or murmur. Abdomen.  Soft, nontender, nondistended, BS positive. CNS.  Alert and oriented .  No focal neurologic deficit. Extremities.  No edema, no cyanosis, pulses intact and symmetrical. Psychiatry.  Judgment and insight appears normal.   Condition at discharge: stable  The results of significant diagnostics from this hospitalization (including imaging, microbiology, ancillary and laboratory) are listed below for reference.  Imaging Studies: CT Hip Right Wo Contrast  Result Date: 06/19/2022 CLINICAL DATA:  Hip trauma, fracture suspected, xray done EXAM: CT OF THE RIGHT HIP WITHOUT CONTRAST TECHNIQUE: Multidetector CT imaging of the right hip was performed according to the standard protocol. Multiplanar CT image reconstructions were also generated. RADIATION DOSE REDUCTION: This exam was performed according to the departmental dose-optimization program which includes automated exposure control, adjustment of the mA and/or kV according to patient size and/or use of iterative reconstruction technique. COMPARISON:  X-ray right hip 06/19/2022 FINDINGS: Bones/Joint/Cartilage Total right hip arthroplasty. Full shaft width inferior right pubic rami fracture. Associated nondisplaced right posterior acetabulum fracture (3:55). Suggestion of an acute nondisplaced anterior right acetabular fracture (9:56). Ligaments Suboptimally assessed by CT. Muscles and Tendons Grossly unremarkable. Soft tissues Gluteal coarsened calcifications along the ischial tuberosities and lower medial gluteal soft tissues. Foci of subcutaneus soft tissue emphysema along the anterior right pelvis as well as multiple foci of emphysema along the right proximal medial thigh of unclear etiology. Other: Lymph node dissection  vascular clips throughout the pelvis. IMPRESSION: 1. Full shaft width inferior right pubic rami fracture. Associated nondisplaced right posterior acetabulum fracture. Suggestion of an acute nondisplaced anterior right acetabular fracture 2. Total right hip arthroplasty. No CT finding to suggest surgical hardware complication. 3. Foci of subcutaneus soft tissue emphysema along the anterior right pelvis as well as multiple foci of emphysema along the right proximal medial thigh of unclear etiology. Finding may be related to infection versus trauma more distally. Correlate with physical exam. Electronically Signed   By: Iven Finn M.D.   On: 06/19/2022 22:06   CT Head Wo Contrast  Result Date: 06/19/2022 CLINICAL DATA:  Polytrauma, blunt.  Mechanical fall. EXAM: CT HEAD WITHOUT CONTRAST CT CERVICAL SPINE WITHOUT CONTRAST TECHNIQUE: Multidetector CT imaging of the head and cervical spine was performed following the standard protocol without intravenous contrast. Multiplanar CT image reconstructions of the cervical spine were also generated. RADIATION DOSE REDUCTION: This exam was performed according to the departmental dose-optimization program which includes automated exposure control, adjustment of the mA and/or kV according to patient size and/or use of iterative reconstruction technique. COMPARISON:  08/13/2021, 10/27/2003. FINDINGS: CT HEAD FINDINGS Brain: No acute intracranial hemorrhage, midline shift or mass effect. No extra-axial fluid collection. Periventricular white matter hypodensities are present bilaterally. No hydrocephalus. Vascular: No hyperdense vessel or unexpected calcification. Skull: Normal. Negative for fracture or focal lesion. Sinuses/Orbits: Small opacity is noted in the ethmoid air cells on the right. No acute orbital abnormality. Other: Small subcutaneous fat stranding over the occipital bone on the right, possible contusion. CT CERVICAL SPINE FINDINGS Alignment: There is mild  anterolisthesis at C3-C4, C4-C5, and C7-T1. Skull base and vertebrae: No acute fracture. No primary bone lesion or focal pathologic process. Soft tissues and spinal canal: No prevertebral fluid or swelling. No visible canal hematoma. Disc levels: Multilevel intervertebral disc space narrowing, uncovertebral osteophyte formation and facet arthropathy, most pronounced at C5-C6 and C6-C7. Upper chest: No acute abnormality. Other: None. IMPRESSION: 1. No acute intracranial process. 2. Chronic microvascular ischemic changes. 3. Multilevel degenerative changes in the cervical spine without evidence of acute fracture. Electronically Signed   By: Brett Fairy M.D.   On: 06/19/2022 20:29   CT Cervical Spine Wo Contrast  Result Date: 06/19/2022 CLINICAL DATA:  Polytrauma, blunt.  Mechanical fall. EXAM: CT HEAD WITHOUT CONTRAST CT CERVICAL SPINE WITHOUT CONTRAST TECHNIQUE: Multidetector CT imaging of the head and cervical spine was performed following the standard protocol without  intravenous contrast. Multiplanar CT image reconstructions of the cervical spine were also generated. RADIATION DOSE REDUCTION: This exam was performed according to the departmental dose-optimization program which includes automated exposure control, adjustment of the mA and/or kV according to patient size and/or use of iterative reconstruction technique. COMPARISON:  08/13/2021, 10/27/2003. FINDINGS: CT HEAD FINDINGS Brain: No acute intracranial hemorrhage, midline shift or mass effect. No extra-axial fluid collection. Periventricular white matter hypodensities are present bilaterally. No hydrocephalus. Vascular: No hyperdense vessel or unexpected calcification. Skull: Normal. Negative for fracture or focal lesion. Sinuses/Orbits: Small opacity is noted in the ethmoid air cells on the right. No acute orbital abnormality. Other: Small subcutaneous fat stranding over the occipital bone on the right, possible contusion. CT CERVICAL SPINE FINDINGS  Alignment: There is mild anterolisthesis at C3-C4, C4-C5, and C7-T1. Skull base and vertebrae: No acute fracture. No primary bone lesion or focal pathologic process. Soft tissues and spinal canal: No prevertebral fluid or swelling. No visible canal hematoma. Disc levels: Multilevel intervertebral disc space narrowing, uncovertebral osteophyte formation and facet arthropathy, most pronounced at C5-C6 and C6-C7. Upper chest: No acute abnormality. Other: None. IMPRESSION: 1. No acute intracranial process. 2. Chronic microvascular ischemic changes. 3. Multilevel degenerative changes in the cervical spine without evidence of acute fracture. Electronically Signed   By: Brett Fairy M.D.   On: 06/19/2022 20:29   DG Chest Portable 1 View  Result Date: 06/19/2022 CLINICAL DATA:  Status post fall EXAM: PORTABLE CHEST 1 VIEW COMPARISON:  01/10/2022 FINDINGS: Cardiac shadow is within normal limits. Left basilar atelectasis is noted. Lungs are otherwise clear. No bony abnormality is noted. IMPRESSION: Mild left basilar atelectasis. Electronically Signed   By: Inez Catalina M.D.   On: 06/19/2022 20:02   DG HIP UNILAT WITH PELVIS 2-3 VIEWS RIGHT  Result Date: 06/19/2022 CLINICAL DATA:  Status post recent fall with right hip pain, initial encounter EXAM: DG HIP (WITH OR WITHOUT PELVIS) 3V RIGHT COMPARISON:  06/06/2017 FINDINGS: Dystrophic calcifications are again identified about the hip joints and within the gluteal soft tissues. Right hip replacement is again noted and appears well seated. No acute fracture is noted. No other focal abnormality is seen. IMPRESSION: No acute fracture noted. Evidence of prosthetic dislocation. Chronic dystrophic calcifications about the hip joints. Electronically Signed   By: Inez Catalina M.D.   On: 06/19/2022 19:59    Microbiology: Results for orders placed or performed in visit on 05/26/22  CULTURE, URINE COMPREHENSIVE     Status: Abnormal   Collection Time: 05/26/22 10:34 AM    Specimen: Blood  Result Value Ref Range Status   Source: NOT GIVEN  Final   Status: FINAL  Final   Isolate 1: Pseudomonas aeruginosa (A)  Final    Comment: Greater than 100,000 CFU/mL of Pseudomonas aeruginosa      Susceptibility   Pseudomonas aeruginosa - CULT, URN, SPECIAL NEGATIVE 1    CEFTAZIDIME 8 Sensitive     CEFEPIME 4 Sensitive     CIPROFLOXACIN <=0.25 Sensitive     LEVOFLOXACIN 2 Intermediate     GENTAMICIN <=1 Sensitive     IMIPENEM 2 Sensitive     PIP/TAZO 64 Intermediate     TOBRAMYCIN* <=1 Sensitive      * Legend: S = Susceptible  I = Intermediate R = Resistant  NS = Not susceptible * = Not tested  NR = Not reported **NN = See antimicrobic comments     Labs: CBC: Recent Labs  Lab 06/19/22 1921 06/20/22 0122  WBC 9.1 9.4  NEUTROABS 7.2  --   HGB 11.0* 11.1*  HCT 34.7* 35.3*  MCV 91.6 91.5  PLT 268 [id number]   Basic Metabolic Panel: Recent Labs  Lab 06/19/22 1921 06/20/22 0122 06/21/22 0332  NA 136 130* 130*  K 3.2* 4.1 4.5  CL 109 97* 100  CO2 22 26 24   GLUCOSE 85 113* 101*  BUN 18 19 16   CREATININE 0.61 0.78 0.73  CALCIUM 6.8* 8.8* 8.7*  MG  --  2.2  --    Liver Function Tests: Recent Labs  Lab 06/20/22 0122  AST 31  ALT 19  ALKPHOS 60  BILITOT 0.3  PROT 6.7  ALBUMIN 3.6   CBG: Recent Labs  Lab 06/19/22 2357  GLUCAP 112*    Discharge time spent: greater than 30 minutes.  This record has been created using Systems analyst. Errors have been sought and corrected,but may not always be located. Such creation errors do not reflect on the standard of care.   Signed: Lorella Nimrod, MD [facility] 06/22/2022

## 2022-06-22 NOTE — Progress Notes (Signed)
Inpatient Rehab Admissions Coordinator:    I have a CIR bed for this Pt. RN may call report to [phone]  Roshawn Lacina, MS, CCC-SLP Rehab Admissions Coordinator  [phone] (celll) [phone] (office)  

## 2022-06-23 DIAGNOSIS — S32591D Other specified fracture of right pubis, subsequent encounter for fracture with routine healing: Secondary | ICD-10-CM | POA: Diagnosis not present

## 2022-06-23 DIAGNOSIS — S32409D Unspecified fracture of unspecified acetabulum, subsequent encounter for fracture with routine healing: Secondary | ICD-10-CM

## 2022-06-23 DIAGNOSIS — S32592D Other specified fracture of left pubis, subsequent encounter for fracture with routine healing: Secondary | ICD-10-CM

## 2022-06-23 DIAGNOSIS — S32591A Other specified fracture of right pubis, initial encounter for closed fracture: Principal | ICD-10-CM | POA: Diagnosis present

## 2022-06-23 MED ORDER — KETOROLAC TROMETHAMINE 10 MG PO TABS
10.0000 mg | ORAL_TABLET | Freq: Three times a day (TID) | ORAL | Status: AC | PRN
  Administered 2022-06-25 – 2022-06-26 (×2): 10 mg via ORAL
  Filled 2022-06-23 (×3): qty 1

## 2022-06-23 MED ORDER — DOCUSATE SODIUM 100 MG PO CAPS
100.0000 mg | ORAL_CAPSULE | Freq: Two times a day (BID) | ORAL | Status: DC
  Administered 2022-06-23 – 2022-07-01 (×16): 100 mg via ORAL
  Filled 2022-06-23 (×16): qty 1

## 2022-06-23 MED ORDER — SORBITOL 70 % SOLN
30.0000 mL | Freq: Once | Status: AC
  Administered 2022-06-23: 30 mL via ORAL
  Filled 2022-06-23: qty 30

## 2022-06-23 MED ORDER — HYDROMORPHONE HCL 2 MG PO TABS
2.0000 mg | ORAL_TABLET | ORAL | Status: DC | PRN
  Administered 2022-06-23 – 2022-06-25 (×5): 2 mg via ORAL
  Filled 2022-06-23 (×5): qty 1

## 2022-06-23 NOTE — Discharge Summary (Signed)
Physician Discharge Summary  Patient ID: Kayla [name] MRN: [id number] DOB/AGE: Apr 08, 1950 72 y.o.  Admit date: 06/22/2022 Discharge date: 07/01/2022  Discharge Diagnoses:  Principal Problem:   Bilateral pubic rami fractures Active Problems:   Acetabular fracture Multiple sclerosis Hypothyroidism Iron deficiency anemia Cough variant asthma Chronic constipation Recurrent UTIs Sleep apnea Insomnia Depression  GERD Hyponatremia Chronic urinary retention RLL pnemonia  Discharged Condition: {condition:18240}  Significant Diagnostic Studies:  Narrative & Impression  CLINICAL DATA:  Cough.   EXAM: CHEST - 2 VIEW   COMPARISON:  06/19/2022 and older   FINDINGS: Underinflation. Left basilar opacities slightly improved with some residual. There is some increasing right basilar opacity but worsening inflation today. Subtle right midlung opacity as well. No pneumothorax or effusion. No edema. Stable cardiopericardial silhouette. Lordotic x-ray.   IMPRESSION: Worsening inflation with increasing right mid right lung base opacity. Decreasing left lung base opacity with some residual. Recommend continued follow-up     Electronically Signed   By: Karen Kays M.D.   On: 06/27/2022 17:21    Narrative & Impression  CLINICAL DATA:  Constipation by delayed colonic transit   EXAM: ABDOMEN - 1 VIEW   COMPARISON:  11/08/2020 abdominal CT   FINDINGS: Formed stool distends the proximal colon and transverse colon. Gas distends the descending colon. No concerning mass effect or gas collection. Ingested pill seen over the left upper quadrant/stomach. Prior pelvic lymphadenectomy. Right hip replacement. Streaky density at the lung bases seen on comparison CT is well.   IMPRESSION: Stool distended proximal and transverse colon.     Electronically Signed   By: Tiburcio Pea M.D.   On: 06/27/2022 08:10     Labs:  Basic Metabolic Panel: Recent Labs  Lab 06/19/22 1921  06/20/22 0122 06/21/22 0332  NA 136 130* 130*  K 3.2* 4.1 4.5  CL 109 97* 100  CO2 22 26 24   GLUCOSE 85 113* 101*  BUN 18 19 16   CREATININE 0.61 0.78 0.73  CALCIUM 6.8* 8.8* 8.7*  MG  --  2.2  --     CBC: Recent Labs  Lab 06/19/22 1921 06/20/22 0122  WBC 9.1 9.4  NEUTROABS 7.2  --   HGB 11.0* 11.1*  HCT 34.7* 35.3*  MCV 91.6 91.5  PLT 268 256    CBG: Recent Labs  Lab 06/19/22 2357  GLUCAP 112*    Brief HPI:   Kayla [name] is a 72 y.o. female with a past medical history of MS, hypothyroidism, cough variant asthma, chronic constipation, iron deficiency anemia, recurrent UTIs, cervical cancer, osteoporosis, sleep apnea, and a history of a right hip replacement who presented to the ED from home after she had a mechanical fall. She reported hitting her head on the floor and also complained of right hip pain. She was found to have a full shaft width inferior right pubic rami fracture and associated nondisplaced right posterior acetabulum fracture. Orthopedic surgery was consulted and recommended that patient be weightbearing as tolerated with conservative care.    Hospital Course: Kayla [name] was admitted to rehab 06/22/2022 for inpatient therapies to consist of PT, ST and OT at least three hours five days a week. Past admission physiatrist, therapy team and rehab RN have worked together to provide customized collaborative inpatient rehab. Follow-up serum sodium stable at 130. H and H remained stable on oral iron. Required Dilaudid IV for pain control with therapy. Lidoderm patches not effective. Scheduled Robaxin and diclofenac 4/05. Decreased Myrbetriq to QOD- since had some  more urinary retention than did- has been on Myrbetriq for 6 weeks has tried Oxybutynin without good success in past.  Noticed some increase in chronic cough and chest x-ray obtained. Started treatment for RLL pneumonia with Zithromax on 4/06 for three days. Started oral bowel prep (half dose) for  constipation.   Blood pressures were monitored on TID basis and remained stable.   Rehab course: During patient's stay in rehab weekly team conferences were held to monitor patient's progress, set goals and discuss barriers to discharge. At admission, patient required min to mod assist with mobility, mod with basic self-care skills.  She has had improvement in activity tolerance, balance, postural control as well as ability to compensate for deficits. She has had improvement in functional use RUE/LUE  and RLE/LLE as well as improvement in awareness       Disposition:  There are no questions and answers to display.         Diet: Regular  Special Instructions: No driving, alcohol consumption or tobacco use.   Allergies as of 06/23/2022       Reactions   Tramadol    hallucinations    Contrast Media [iodinated Contrast Media] Rash   Iohexol Hives   Morphine And Related Other (See Comments)   Reaction:  Hallucinations    Methylprednisolone Other (See Comments)   Reaction:  Decreases pts heart rate    Hydrocodone Itching   Oxycodone-acetaminophen Rash, Other (See Comments)   Reaction:  Hallucinations    Phenytoin Sodium Extended Rash   Zanaflex [tizanidine Hydrochloride] Rash     Med Rec must be completed prior to using this SMARTLINK***        Signed: Milinda Antis 06/23/2022, 9:36 AM

## 2022-06-23 NOTE — Progress Notes (Signed)
Met with patient. Working with therapy. Verified that does still void and only I/O cath first thing in morning and before going to bed. Discussed conference on every Tuesday and SW will follow up. Day of discharge we do want family to be here at 0800 for discharge information. PA will go over discharge paperwork and then place in binder. Discussed binder with patient. All needs met.

## 2022-06-23 NOTE — Progress Notes (Signed)
Patient ID: Kayla [name], female   DOB: September 08, 1950, 72 y.o.   MRN: [id number]  1036- Sw spoke with pt husband Kayla [name] [phone]) to introduce self, explain role, and discuss discharge process. Confirms it is just the two of them at discharge. States that they have a family event to attend on 4/11 and very sure his wife is going to push herself to make sure she can be present. SW will follow-up with updates.   35- SW met with pt in room, and pt husband and friend/neighbor present. SW confirms discharge plan. Pt would like to continue to work with PT to get as strong as much as possible. Was attending outpatient 2xs a wk with PT. SW confirms will follow-up with updates after team conference.   *SW will follow-up to complete assessment.   Loralee Pacas, MSW, [place] Office: [phone] Cell: [phone] Fax: [phone]

## 2022-06-23 NOTE — Discharge Instructions (Addendum)
Inpatient Rehab Discharge Instructions  Kayla [name] Discharge date and time: 07/01/2022  Activities/Precautions/ Functional Status: Activity: no lifting, driving, or strenuous exercise until cleared by MD Diet: regular diet Wound Care: none needed  Functional status:  ___ No restrictions     ___ Walk up steps independently ___ 24/7 supervision/assistance   ___ Walk up steps with assistance _x__ Intermittent supervision/assistance  ___ Bathe/dress independently ___ Walk with walker     ___ Bathe/dress with assistance ___ Walk Independently    ___ Shower independently ___ Walk with assistance    _x__ Shower with assistance _x__ No alcohol     ___ Return to work/school ________ Special Instructions: No driving, alcohol consumption or tobacco use.     COMMUNITY REFERRALS UPON DISCHARGE:      Outpatient: PT      OT                  Agency: [facility]             Phone:  [phone]              Appointment Date/Time: *Referral is pending their physician clinic approval. Once approved, the office will follow-up. Please contact office to check status of referral as well. *      My questions have been answered and I understand these instructions. I will adhere to these goals and the provided educational materials after my discharge from the hospital.  Patient/Caregiver Signature _______________________________ Date __________  Clinician Signature _______________________________________ Date __________  Please bring this form and your medication list with you to all your follow-up doctor's appointments.

## 2022-06-23 NOTE — Progress Notes (Signed)
Occupational Therapy Assessment and Plan  Patient Details  Name: Kayla [name] MRN: [id number] Date of Birth: March 06, 1951  OT Diagnosis: muscle weakness (generalized) Rehab Potential: Rehab Potential (ACUTE ONLY): Good ELOS: 10-12 days   Today's Date: 06/23/2022 OT Individual Time: [id number] OT Individual Time Calculation (min): 65 min     Hospital Problem: Principal Problem:   Bilateral pubic rami fractures Active Problems:   Acetabular fracture   Past Medical History:  Past Medical History:  Diagnosis Date   Arthritis    oa   Cervical cancer ([place]) 2005   COPD (chronic obstructive pulmonary disease) ([place])    Multiple sclerosis ([place])    Multiple sclerosis ([place]) 1996   Neuromuscular disorder ([place])    Osteoporosis    Other diseases of vocal cords    vocal cords are not even   Other voice and resonance disorders    Pneumonia 2014, 1992   Sleep apnea    no cpap used   Unspecified hypothyroidism    Past Surgical History:  Past Surgical History:  Procedure Laterality Date   ABDOMINAL HYSTERECTOMY  03/25/2003   for cervical cancer, complete   BRONCHIAL BIOPSY  10/26/2020   Procedure: BRONCHIAL BIOPSIES;  Surgeon: Garner Nash, DO;  Location: [facility];  Service: Pulmonary;;   BRONCHIAL BRUSHINGS  10/26/2020   Procedure: BRONCHIAL BRUSHINGS;  Surgeon: Garner Nash, DO;  Location: [facility];  Service: Pulmonary;;   BRONCHIAL WASHINGS  10/26/2020   Procedure: BRONCHIAL WASHINGS;  Surgeon: Garner Nash, DO;  Location: [facility];  Service: Pulmonary;;   CATARACT EXTRACTION, BILATERAL Bilateral    lens for cataracts   CONVERSION TO TOTAL HIP Right 09/18/2015   Procedure: CONVERSION OF RIGHT HEMI ARTHROPLASTY TO TOTAL HIP ARTHROPLASTY ACETABULAR REVISON ;  Surgeon: Paralee Cancel, MD;  Location: [facility];  Service: Orthopedics;  Laterality: Right;   HIP ARTHROPLASTY Right 03/01/2013   Procedure: ARTHROPLASTY BIPOLAR HIP;  Surgeon: Mauri Pole, MD;  Location:  [facility];  Service: Orthopedics;  Laterality: Right;   HIP CLOSED REDUCTION Right 03/12/2013   Procedure: CLOSED REDUCTION HIP;  Surgeon: Gearlean Alf, MD;  Location: [facility];  Service: Orthopedics;  Laterality: Right;   HIP CLOSED REDUCTION Right 03/19/2013   Procedure: CLOSED REDUCTION HIP;  Surgeon: Johnn Hai, MD;  Location: [facility];  Service: Orthopedics;  Laterality: Right;   HIP CLOSED REDUCTION Right 11/10/2015   Procedure: CLOSED MANIPULATION HIP;  Surgeon: Rod Can, MD;  Location: [facility];  Service: Orthopedics;  Laterality: Right;   HIP CLOSED REDUCTION Right 06/05/2017   Procedure: CLOSED REDUCTION HIP;  Surgeon: [name], MD;  Location: [facility];  Service: Orthopedics;  Laterality: Right;   SMALL INTESTINE SURGERY     TOTAL HIP REVISION Right 06/06/2017   Procedure: TOTAL HIP REVISION;  Surgeon: [name], MD;  Location: [facility];  Service: Orthopedics;  Laterality: Right;   VESICOVAGINAL FISTULA CLOSURE W/ TAH  03/25/2003   VIDEO BRONCHOSCOPY N/A 10/26/2020   Procedure: VIDEO BRONCHOSCOPY WITH FLUORO;  Surgeon: Garner Nash, DO;  Location: [facility];  Service: Pulmonary;  Laterality: N/A;    Assessment & Plan Clinical Impression: . Mrs. Grumbling is a 72 year old woman with a past medical history of MS, hypothyroidism, cough variant asthma, chronic constipation, iron deficiency anemia, recurrent UTIs, cervical cancer, osteoporosis, sleep apnea, and a history of a right hip replacement who presented to the ED from home after she had a mechanical fall. She reported hitting her head on  the floor and also complained of right hip pain. She was found to have a full shaft width inferior right pubic rami fracture and associated nondisplaced right posterior acetabulum fracture. Orthopedic surgery was consulted and recommended that patient be weightbearing as tolerated with conservative care.   Patient transferred to CIR on 06/22/2022 .    Patient currently requires mod  with basic self-care skills secondary to muscle weakness and muscle joint tightness and decreased standing balance and decreased balance strategies.  Prior to hospitalization, patient was fully independent and working.    Patient will benefit from skilled intervention to increase independence with basic self-care skills prior to discharge home with care partner.  Anticipate patient will require intermittent supervision and follow up home health.  OT - End of Session Activity Tolerance: Tolerates 10 - 20 min activity with multiple rests Endurance Deficit: Yes OT Assessment Rehab Potential (ACUTE ONLY): Good OT Patient demonstrates impairments in the following area(s): Balance;Endurance;Motor;Pain OT Basic ADL's Functional Problem(s): Bathing;Dressing;Toileting OT Transfers Functional Problem(s): Toilet;Tub/Shower OT Additional Impairment(s): None OT Plan OT Intensity: Minimum of 1-2 x/day, 45 to 90 minutes OT Frequency: 5 out of 7 days OT Duration/Estimated Length of Stay: 10-12 days OT Treatment/Interventions: Teacher, English as a foreign language;Discharge planning;Functional mobility training;Psychosocial support;Patient/family education;Pain management;Self Care/advanced ADL retraining;UE/LE Strength taining/ROM;Therapeutic Exercise;Therapeutic Activities OT Self Feeding Anticipated Outcome(s): no goal, pt is independent OT Basic Self-Care Anticipated Outcome(s): supervision OT Toileting Anticipated Outcome(s): supervision OT Bathroom Transfers Anticipated Outcome(s): supervision OT Recommendation Patient destination: Home Follow Up Recommendations: Home health OT Equipment Recommended: To be determined Equipment Details: pt has some AE and DME at home.   OT Evaluation Precautions/Restrictions  Precautions Precautions: Fall Precaution Comments: h/o R foot drop; uses Bion[employer] for all walking at home Restrictions Weight Bearing Restrictions: Yes RLE Weight  Bearing: Partial weight bearing RLE Partial Weight Bearing Percentage or Pounds: 50%   Pain  Pt reports 3/10 R hip pain, premedicated Home Living/Prior Functioning Home Living Family/patient expects to be discharged to:: Private residence Living Arrangements: Spouse/significant other Available Help at Discharge: Family, Available 24 hours/day Type of Home: House Home Access: Stairs to enter Technical brewer of Steps: 4 Entrance Stairs-Rails: Right, Left Home Layout: One level  Lives With: Spouse Prior Function Level of Independence: Independent with gait, Independent with transfers, Requires assistive device for independence, Independent with basic ADLs, Independent with homemaking with ambulation  Able to Take Stairs?: Yes Driving: Yes Vocation: Part time employment Vision Baseline Vision/History: 1 Wears glasses Ability to See in Adequate Light: 0 Adequate Patient Visual Report: No change from baseline Vision Assessment?: No apparent visual deficits Perception  Perception: Within Functional Limits Praxis Praxis: Intact Cognition Cognition Overall Cognitive Status: Within Functional Limits for tasks assessed Arousal/Alertness: Awake/alert Orientation Level: Person;Place;Situation Person: Oriented Place: Oriented Situation: Oriented Memory: Appears intact Awareness: Appears intact Problem Solving: Appears intact Safety/Judgment: Appears intact Brief Interview for Mental Status (BIMS) Repetition of Three Words (First Attempt): 3 Temporal Orientation: Year: Correct Temporal Orientation: Month: Accurate within 5 days Temporal Orientation: Day: Correct Recall: "Sock": Yes, no cue required Recall: "Blue": Yes, no cue required Recall: "Bed": Yes, no cue required BIMS Summary Score: 15 Sensation Sensation Light Touch: Appears Intact Hot/Cold: Appears Intact Proprioception: Appears Intact Stereognosis: Not tested Coordination Gross Motor Movements are Fluid and  Coordinated: No Fine Motor Movements are Fluid and Coordinated: Yes Motor  Motor Motor: Abnormal postural alignment and control;Other (comment) Motor - Skilled Clinical Observations: pain limiting, stiff movements overall, decreased coordination  Trunk/Postural Assessment  Postural Control Postural  Control: Deficits on evaluation (decreased balance strategies, difficulty with transitions)  Balance Balance Balance Assessed: Yes Static Sitting Balance Static Sitting - Level of Assistance: 5: Stand by assistance Dynamic Sitting Balance Dynamic Sitting - Level of Assistance: 5: Stand by assistance Static Standing Balance Static Standing - Level of Assistance: 4: Min assist Dynamic Standing Balance Dynamic Standing - Level of Assistance: 4: Min assist Extremity/Trunk Assessment RUE Assessment RUE Assessment: Within Functional Limits LUE Assessment LUE Assessment: Within Functional Limits  Care Tool Care Tool Self Care Eating   Eating Assist Level: Independent    Oral Care    Oral Care Assist Level: Independent    Bathing   Body parts bathed by patient: Right arm;Left arm;Chest;Abdomen;Front perineal area;Buttocks;Right upper leg;Left upper leg;Face Body parts bathed by helper: Right lower leg;Left lower leg   Assist Level: Minimal Assistance - Patient > 75%    Upper Body Dressing(including orthotics)   What is the patient wearing?: Pull over shirt   Assist Level: Set up assist    Lower Body Dressing (excluding footwear)   What is the patient wearing?: Underwear/pull up;Pants Assist for lower body dressing: Maximal Assistance - Patient 25 - 49%    Putting on/Taking off footwear   What is the patient wearing?: Non-skid slipper socks;Shoes Assist for footwear: Total Assistance - Patient < 25%       Care Tool Toileting Toileting activity   Assist for toileting: Maximal Assistance - Patient 25 - 49%     Care Tool Bed Mobility Roll left and right activity   Roll left  and right assist level: Contact Guard/Touching assist    Sit to lying activity   Sit to lying assist level: Moderate Assistance - Patient 50 - 74%    Lying to sitting on side of bed activity   Lying to sitting on side of bed assist level: the ability to move from lying on the back to sitting on the side of the bed with no back support.: Moderate Assistance - Patient 50 - 74%     Care Tool Transfers Sit to stand transfer   Sit to stand assist level: Moderate Assistance - Patient 50 - 74%    Chair/bed transfer   Chair/bed transfer assist level: Minimal Assistance - Patient > 75%     Toilet transfer   Assist Level: Minimal Assistance - Patient > 75%     Care Tool Cognition  Expression of Ideas and Wants Expression of Ideas and Wants: 4. Without difficulty (complex and basic) - expresses complex messages without difficulty and with speech that is clear and easy to understand  Understanding Verbal and Non-Verbal Content Understanding Verbal and Non-Verbal Content: 4. Understands (complex and basic) - clear comprehension without cues or repetitions   Memory/Recall Ability Memory/Recall Ability : That he or she is in a hospital/hospital unit;Staff names and faces;Location of own room;Current season   Refer to Care Plan for Long Term Goals  SHORT TERM GOAL WEEK 1 OT Short Term Goal 1 (Week 1): Pt will use reacher to don pants over feet with set up. OT Short Term Goal 2 (Week 1): Pt will use long sponge to wash feet with set up. OT Short Term Goal 3 (Week 1): Pt will pull pants over hips with min A and minA to guard her balance.  Recommendations for other services: None    Skilled Therapeutic Intervention ADL ADL Eating: Independent Grooming: Independent Upper Body Bathing: Setup Where Assessed-Upper Body Bathing: Shower Lower Body Bathing: Moderate assistance Where Assessed-Lower  Body Bathing: Shower Upper Body Dressing: Setup Where Assessed-Upper Body Dressing:  Wheelchair Toileting: Maximal assistance Where Assessed-Toileting: Medical laboratory scientific officer: Minimal Print production planner Method: Arts development officer: Raised toilet seat Social research officer, government: Environmental education officer Method: Radiographer, therapeutic: Transfer tub bench;Grab bars Mobility  Bed Mobility Bed Mobility: Rolling Right;Supine to Sit;Sit to Supine Rolling Right: Contact Guard/Touching assist Supine to Sit: Moderate Assistance - Patient 50-74% Sit to Supine: Moderate Assistance - Patient 50-74% Transfers Sit to Stand: Moderate Assistance - Patient 50-74%;Minimal Assistance - Patient > 75% Stand to Sit: Minimal Assistance - Patient > 75%  Pt seen for initial evaluation and ADL training. Reviewed role of OT, discussed POC, ELOS and pt's goals.  Pt eager to shower.  Obtained wc and walker for pt.  Discussed with case manager RN orders for WBAT per H&P and no WB orders.  Chart had stated 50% RLE WB from original ortho note, but MD stated in note she had consulted with ortho about WBAT.  May need to follow up on recommendations.  Pt able to sit to stand to RW with cues for hand and foot placement with min A, pivoted to wc min A and then to tub bench using grab bars. See ADL documentation above for details. Pt will benefit from AE for dressing and bathing.  Pt resting in wc with all needs met and NT in room to assist with alarm belt.   Discharge Criteria: Patient will be discharged from OT if patient refuses treatment 3 consecutive times without medical reason, if treatment goals not met, if there is a change in medical status, if patient makes no progress towards goals or if patient is discharged from hospital.  The above assessment, treatment plan, treatment alternatives and goals were discussed and mutually agreed upon: by patient  [facility] 06/23/2022, 12:30 PM

## 2022-06-23 NOTE — Plan of Care (Signed)
  Problem: RH Balance Goal: LTG Patient will maintain dynamic standing with ADLs (OT) Description: LTG:  Patient will maintain dynamic standing balance with assist during activities of daily living (OT)  Flowsheets (Taken 06/23/2022 1234) LTG: Pt will maintain dynamic standing balance during ADLs with: Supervision/Verbal cueing   Problem: Sit to Stand Goal: LTG:  Patient will perform sit to stand in prep for activites of daily living with assistance level (OT) Description: LTG:  Patient will perform sit to stand in prep for activites of daily living with assistance level (OT) Flowsheets (Taken 06/23/2022 1234) LTG: PT will perform sit to stand in prep for activites of daily living with assistance level: Supervision/Verbal cueing   Problem: RH Bathing Goal: LTG Patient will bathe all body parts with assist levels (OT) Description: LTG: Patient will bathe all body parts with assist levels (OT) Flowsheets (Taken 06/23/2022 1234) LTG: Pt will perform bathing with assistance level/cueing: Supervision/Verbal cueing   Problem: RH Dressing Goal: LTG Patient will perform lower body dressing w/assist (OT) Description: LTG: Patient will perform lower body dressing with assist, with/without cues in positioning using equipment (OT) Flowsheets (Taken 06/23/2022 1234) LTG: Pt will perform lower body dressing with assistance level of: Supervision/Verbal cueing   Problem: RH Toileting Goal: LTG Patient will perform toileting task (3/3 steps) with assistance level (OT) Description: LTG: Patient will perform toileting task (3/3 steps) with assistance level (OT)  Flowsheets (Taken 06/23/2022 1234) LTG: Pt will perform toileting task (3/3 steps) with assistance level: Supervision/Verbal cueing   Problem: RH Toilet Transfers Goal: LTG Patient will perform toilet transfers w/assist (OT) Description: LTG: Patient will perform toilet transfers with assist, with/without cues using equipment (OT) Flowsheets (Taken  06/23/2022 1234) LTG: Pt will perform toilet transfers with assistance level of: Supervision/Verbal cueing   Problem: RH Tub/Shower Transfers Goal: LTG Patient will perform tub/shower transfers w/assist (OT) Description: LTG: Patient will perform tub/shower transfers with assist, with/without cues using equipment (OT) Flowsheets (Taken 06/23/2022 1234) LTG: Pt will perform tub/shower stall transfers with assistance level of: Contact Guard/Touching assist

## 2022-06-23 NOTE — Evaluation (Signed)
Physical Therapy Assessment and Plan  Patient Details  Name: Kayla [name] MRN: [id number] Date of Birth: 02/16/1951  PT Diagnosis: Difficulty walking, Edema, Muscle weakness, and Pain in joint Rehab Potential: Good ELOS: 10-12 days   Today's Date: 06/23/2022 PT Individual Time: 1000-1100 PT Individual Time Calculation (min): 60 min    Hospital Problem: Principal Problem:   Bilateral pubic rami fractures Active Problems:   Acetabular fracture   Past Medical History:  Past Medical History:  Diagnosis Date   Arthritis    oa   Cervical cancer ([place]) 2005   COPD (chronic obstructive pulmonary disease) ([place])    Multiple sclerosis ([place])    Multiple sclerosis ([place]) 1996   Neuromuscular disorder ([place])    Osteoporosis    Other diseases of vocal cords    vocal cords are not even   Other voice and resonance disorders    Pneumonia 2014, 1992   Sleep apnea    no cpap used   Unspecified hypothyroidism    Past Surgical History:  Past Surgical History:  Procedure Laterality Date   ABDOMINAL HYSTERECTOMY  03/25/2003   for cervical cancer, complete   BRONCHIAL BIOPSY  10/26/2020   Procedure: BRONCHIAL BIOPSIES;  Surgeon: Garner Nash, DO;  Location: [facility];  Service: Pulmonary;;   BRONCHIAL BRUSHINGS  10/26/2020   Procedure: BRONCHIAL BRUSHINGS;  Surgeon: Garner Nash, DO;  Location: [facility];  Service: Pulmonary;;   BRONCHIAL WASHINGS  10/26/2020   Procedure: BRONCHIAL WASHINGS;  Surgeon: Garner Nash, DO;  Location: [facility];  Service: Pulmonary;;   CATARACT EXTRACTION, BILATERAL Bilateral    lens for cataracts   CONVERSION TO TOTAL HIP Right 09/18/2015   Procedure: CONVERSION OF RIGHT HEMI ARTHROPLASTY TO TOTAL HIP ARTHROPLASTY ACETABULAR REVISON ;  Surgeon: Paralee Cancel, MD;  Location: [facility];  Service: Orthopedics;  Laterality: Right;   HIP ARTHROPLASTY Right 03/01/2013   Procedure: ARTHROPLASTY BIPOLAR HIP;  Surgeon: Mauri Pole, MD;  Location: [facility];  Service: Orthopedics;  Laterality: Right;   HIP CLOSED REDUCTION Right 03/12/2013   Procedure: CLOSED REDUCTION HIP;  Surgeon: Gearlean Alf, MD;  Location: [facility];  Service: Orthopedics;  Laterality: Right;   HIP CLOSED REDUCTION Right 03/19/2013   Procedure: CLOSED REDUCTION HIP;  Surgeon: Johnn Hai, MD;  Location: [facility];  Service: Orthopedics;  Laterality: Right;   HIP CLOSED REDUCTION Right 11/10/2015   Procedure: CLOSED MANIPULATION HIP;  Surgeon: Rod Can, MD;  [facility];  Service: Orthopedics;  Laterality: Right;   HIP CLOSED REDUCTION Right 06/05/2017   Procedure: CLOSED REDUCTION HIP;  Surgeon: [name], MD;  [facility];  Service: Orthopedics;  Laterality: Right;   SMALL INTESTINE SURGERY     TOTAL HIP REVISION Right 06/06/2017   Procedure: TOTAL HIP REVISION;  Surgeon: [name], MD;  [facility];  Service: Orthopedics;  Laterality: Right;   VESICOVAGINAL FISTULA CLOSURE W/ TAH  03/25/2003   VIDEO BRONCHOSCOPY N/A 10/26/2020   Procedure: VIDEO BRONCHOSCOPY WITH FLUORO;  Surgeon: Garner Nash, DO;  Location: [facility];  Service: Pulmonary;  Laterality: N/A;    Assessment & Plan Clinical Impression: Patient is a 72 y.o. year old female with medical history significant of MS, hypothyroidism, cough variant asthma versus vocal cord dysfunction related to MS, chronic constipation, iron deficiency anemia, recurrent UTIs, cervical cancer, osteoporosis, sleep apnea, history of right hip replacement presented to the  [facility] ED from home after a mechanical fall 06/19/22.  Patient  reported hitting her head on the floor and also complained of right hip pain.  Vital signs stable.  Labs significant for no leukocytosis, hemoglobin 11.0 (was 11.8 on 05/26/2022 and previously 12-13 on labs done 5-6 months ago), potassium 3.2, calcium 6.8 (albumin level not checked), UA pending.  Chest x-ray showing mild left basilar atelectasis.  X-ray of right  hip/pelvis showing evidence of prosthetic dislocation but no acute fracture.  CT head/C-spine negative for acute finding.  CT of right hip without contrast showing a full shaft width inferior right pubic rami fracture. Associated nondisplaced right posterior acetabulum fracture. Suggestion of an acute nondisplaced anterior right acetabular fracture.  Patient received Valium, fentanyl, and Tdap injection.  ED physician spoke to Dr. Rolena Infante with orthopedics who recommended nonsurgical management including PT/OT, and pain control. Patient transferred to CIR on 06/22/2022 .   Patient currently requires min with mobility secondary to muscle weakness and muscle joint tightness, decreased cardiorespiratoy endurance, and decreased sitting balance, decreased standing balance, decreased postural control, decreased balance strategies, and difficulty maintaining precautions.  Prior to hospitalization, patient was modified independent  with mobility and lived with Spouse in a House home.  Home access is 4Stairs to enter.  Patient will benefit from skilled PT intervention to maximize safe functional mobility, minimize fall risk, and decrease caregiver burden for planned discharge home with 24 hour assist.  Anticipate patient will  HHPT vs OPPT  at discharge.  PT - End of Session Activity Tolerance: Decreased this session Endurance Deficit: Yes PT Assessment Rehab Potential (ACUTE/IP ONLY): Good PT Barriers to Discharge: Home environment access/layout;Weight bearing restrictions PT Barriers to Discharge Comments: PWB status with 4 steps to enter PT Patient demonstrates impairments in the following area(s): Balance;Endurance;Edema;Motor;Pain;Skin Integrity PT Transfers Functional Problem(s): Bed Mobility;Bed to Chair;Car;Furniture PT Locomotion Functional Problem(s): Wheelchair Mobility;Ambulation;Stairs PT Plan PT Intensity: Minimum of 1-2 x/day ,45 to 90 minutes PT Frequency: 5 out of 7 days PT Duration Estimated  Length of Stay: 10-12 days PT Treatment/Interventions: Ambulation/gait training;Balance/vestibular training;Community reintegration;Discharge planning;Disease management/prevention;DME/adaptive equipment instruction;Functional electrical stimulation;Functional mobility training;Neuromuscular re-education;Pain management;Patient/family education;Psychosocial support;Skin care/wound management;Splinting/orthotics;Stair training;Therapeutic Activities;Therapeutic Exercise;UE/LE Strength taining/ROM;UE/LE Coordination activities;Wheelchair propulsion/positioning PT Transfers Anticipated Outcome(s): supervision transfers PT Locomotion Anticipated Outcome(s): supervision gait; min assist stair negotiation PT Recommendation Recommendations for Other Services: Therapeutic Recreation consult Therapeutic Recreation Interventions: Kitchen group;Pet therapy;Outing/community reintergration;Stress management Follow Up Recommendations: Outpatient PT;Home health PT Patient destination: Home Equipment Recommended: To be determined Equipment Details: pt already owns RW, rollator, Transport planner and transport w/c   PT Evaluation Precautions/Restrictions Precautions Precautions: Fall Precaution Comments: h/o R foot drop; uses Bioness for all walking at home Restrictions Weight Bearing Restrictions: Yes RLE Weight Bearing: Partial weight bearing RLE Partial Weight Bearing Percentage or Pounds: 50%  Pain 4/10 pain in RLE/hip - increases with coughing episodes. Premedicated per report.  Pain Interference Pain Interference Pain Effect on Sleep: 2. Occasionally Pain Interference with Therapy Activities: 2. Occasionally Pain Interference with Day-to-Day Activities: 2. Occasionally Home Living/Prior Functioning Home Living Available Help at Discharge: Family;Available 24 hours/day Type of Home: House Home Access: Stairs to enter [employer] of Steps: 4 Entrance Stairs-Rails: Right;Left Home  Layout: One level  Lives With: Spouse Prior Function Level of Independence: Independent with gait;Independent with transfers;Requires assistive device for independence  Able to Take Stairs?: Yes Driving: Yes Vocation: Part time employment Vision/Perception  Vision - History Baseline Vision: Wears glasses Perception Perception: Within Functional Limits Praxis Praxis: Intact  Cognition Overall Cognitive Status: Within Functional Limits for tasks assessed Orientation Level:  Oriented X4 Safety/Judgment: Appears intact Sensation Sensation Light Touch: Appears Intact Proprioception: Appears Intact Coordination Gross Motor Movements are Fluid and Coordinated: No Motor  Motor Motor: Abnormal postural alignment and control;Other (comment) Motor - Skilled Clinical Observations: pain limiting, stiff movements overall, decreased coordination   Trunk/Postural Assessment  Postural Control Postural Control: Deficits on evaluation (decreased balance strategies, difficulty with transitions)  Balance Balance Balance Assessed: Yes Static Sitting Balance Static Sitting - Level of Assistance: 5: Stand by assistance Dynamic Sitting Balance Dynamic Sitting - Level of Assistance: 5: Stand by assistance Static Standing Balance Static Standing - Level of Assistance: 4: Min assist Dynamic Standing Balance Dynamic Standing - Level of Assistance: 4: Min assist Extremity Assessment      RLE Assessment RLE Assessment: Exceptions to [facility] Active Range of Motion (AROM) Comments: hip flexion limited due to pain and weakness; maintains R foot internally rotated General Strength Comments: 3-/5 knee flexion, 3+/5 knee extension, 3-/5 ankle (h/o of foot drop with use of Bioness at home with all walking) LLE Assessment LLE Assessment: Within Functional Limits General Strength Comments: grossly 4-/5  Care Tool Care Tool Bed Mobility Roll left and right activity   Roll left and right assist level:  Contact Guard/Touching assist    Sit to lying activity   Sit to lying assist level: Moderate Assistance - Patient 50 - 74%    Lying to sitting on side of bed activity   Lying to sitting on side of bed assist level: the ability to move from lying on the back to sitting on the side of the bed with no back support.: Moderate Assistance - Patient 50 - 74%     Care Tool Transfers Sit to stand transfer   Sit to stand assist level: Moderate Assistance - Patient 50 - 74%    Chair/bed transfer   Chair/bed transfer assist level: Minimal Assistance - Patient > 75%     Toilet transfer   Assist Level: Minimal Assistance - Patient > 75%    Car transfer   Car transfer assist level: Moderate Assistance - Patient 50 - 74%      Care Tool Locomotion Ambulation   Assist level: Minimal Assistance - Patient > 75% Assistive device: Walker-rolling Max distance: 15'  Walk 10 feet activity   Assist level: Minimal Assistance - Patient > 75% Assistive device: Walker-rolling   Walk 50 feet with 2 turns activity Walk 50 feet with 2 turns activity did not occur: Safety/medical concerns      Walk 150 feet activity Walk 150 feet activity did not occur: Safety/medical concerns      Walk 10 feet on uneven surfaces activity Walk 10 feet on uneven surfaces activity did not occur: Safety/medical concerns      Stairs Stair activity did not occur: Safety/medical concerns (confirming WB status)        Walk up/down 1 step activity Walk up/down 1 step or curb (drop down) activity did not occur: Safety/medical concerns      Walk up/down 4 steps activity Walk up/down 4 steps activity did not occur: Safety/medical concerns      Walk up/down 12 steps activity Walk up/down 12 steps activity did not occur: Safety/medical concerns      Pick up small objects from floor   Pick up small object from the floor assist level: Total Assistance - Patient < 25%    Wheelchair Is the patient using a wheelchair?:  Yes Type of Wheelchair: Manual   Wheelchair assist level: Supervision/Verbal cueing Max wheelchair  distance: 100'  Wheel 50 feet with 2 turns activity   Assist Level: Supervision/Verbal cueing  Wheel 150 feet activity   Assist Level: Moderate Assistance - Patient 50 - 74%    Refer to Care Plan for Long Term Goals  SHORT TERM GOAL WEEK 1 PT Short Term Goal 1 (Week 1): Pt will perform functional transfers with CGA PT Short Term Goal 2 (Week 1): Pt will be able to inititate stair negotiation with mod assist PT Short Term Goal 3 (Week 1): Pt will be able to perform gait x 50' with CGA  Recommendations for other services: Therapeutic Recreation  Pet therapy, Kitchen group, Stress management, and Outing/community reintegration  Skilled Therapeutic Intervention  Evaluation completed (see details above and below) with education on PT POC and goals and individual treatment initiated with focus on functional transfers including simulated car transfer, gait, w/c mobility and bed mobility. Pt overall requires extra time due to pain and generalized fatigue/exertion. Min to light mod assist needed at times for management of R and/or bilateral LE's for bed mobility and car transfer. Cues provided for improved overall technique and assist with balance during positioning of RW with mobility. Cues for adherence to WB status (was awaiting full clarification of PWB vs WBAT so deferred stairs at this time but discussed technique in preparation for later session). End of session assist pt with stand step transfer to toilet (BSC over toilet) with min assist and pt managing clothing with min assist for balance. RN notified of pt request to sit on toilet for a few moments for attempt to have BM.   Pt also reports using Bioness for R foot drop x 2 years for any time she is walking. Pt thought she had it in her room but we couldn't find it this session. She is planning to check with her husband later.   Mobility Bed  Mobility Bed Mobility: Rolling Right;Supine to Sit;Sit to Supine Rolling Right: Contact Guard/Touching assist Supine to Sit: Moderate Assistance - Patient 50-74% Sit to Supine: Moderate Assistance - Patient 50-74% Transfers Transfers: Sit to Stand;Stand to Sit;Stand Pivot Transfers Sit to Stand: Moderate Assistance - Patient 50-74%;Minimal Assistance - Patient > 75% Stand to Sit: Minimal Assistance - Patient > 75% Stand Pivot Transfers: Minimal Assistance - Patient > 75% Stand Pivot Transfer Details: Verbal cues for precautions/safety;Verbal cues for technique;Verbal cues for safe use of DME/AE;Manual facilitation for weight shifting Transfer (Assistive device): Rolling walker Locomotion  Gait Gait Distance (Feet): 15 Feet Assistive device: Rolling walker Gait Gait Pattern: Impaired; narrow BOS, short step length, decreased stance time on R, antalgic gait pattern Stairs / Additional Locomotion Stairs: No Wheelchair Mobility Wheelchair Mobility: Yes Wheelchair Assistance: Chartered loss adjuster: Both upper extremities Wheelchair Parts Management: Needs assistance Distance: 100   Discharge Criteria: Patient will be discharged from PT if patient refuses treatment 3 consecutive times without medical reason, if treatment goals not met, if there is a change in medical status, if patient makes no progress towards goals or if patient is discharged from hospital.  The above assessment, treatment plan, treatment alternatives and goals were discussed and mutually agreed upon: by patient  Juanna Cao, PT, DPT, CBIS  06/23/2022, 12:07 PM

## 2022-06-23 NOTE — Progress Notes (Signed)
Physical Therapy Session Note  Patient Details  Name: Kayla [name] MRN: [id number] Date of Birth: 1950-05-08  Today's Date: 06/23/2022 PT Individual Time: [id number] PT Individual Time Calculation (min): 71 min   Short Term Goals: Week 1:  PT Short Term Goal 1 (Week 1): Pt will perform functional transfers with CGA PT Short Term Goal 2 (Week 1): Pt will be able to inititate stair negotiation with mod assist PT Short Term Goal 3 (Week 1): Pt will be able to perform gait x 50' with CGA  Skilled Therapeutic Interventions/Progress Updates:  Patient received in recliner with spouse at bedside. Pt ready and agreeable to therapy. Bioness found in drawer of room and min assist to apply to Rt LE. Pt completed sit<>stand with Min assist from recliner and pt ambulated ~ 75' from   SpO2 96% on RA HR 80 bpm   Pt completed 2x5 reps sit<>stand from mat table with Rt LE on 4" step and shifted anteriorly to facilitate Lt LE WB and increased lt muscle activation. Ambulated back to room ~75' with RW and min assist. Improved step length and RW positioning. Pt returned to       Therapy Documentation Precautions:  Precautions Precautions: Fall Precaution Comments: h/o R foot drop; uses Bioness for all walking at home Restrictions Weight Bearing Restrictions: Yes RLE Weight Bearing: Partial weight bearing RLE Partial Weight Bearing Percentage or Pounds: 50% General:   Vital Signs: Therapy Vitals Temp: 98.1 F (36.7 C) Temp Source: Oral Pulse Rate: 72 Resp: 16 BP: (!) 108/59 Patient Position (if appropriate): Sitting Oxygen Therapy SpO2: 95 % O2 Device: Room Air Pain: Patient did not rate pain at start of session, monitored throughout. reported 5/10 for pain at EOS, RN present at EOS to provide pain meds.    Therapy/Group: Individual Therapy  Verner Mould, DPT Acute Rehabilitation Services Office [phone]  06/23/22 4:36 PM

## 2022-06-23 NOTE — Progress Notes (Signed)
Inpatient Rehabilitation  Patient information reviewed and entered into eRehab system by Keren Alverio Ernst [place], OTR/L, Rehab Quality Coordinator.   Information including medical coding, functional ability and quality indicators will be reviewed and updated through discharge.   

## 2022-06-23 NOTE — Progress Notes (Signed)
PROGRESS NOTE   Subjective/Complaints:  Pt reports has vocal cord paralyzed due to her MS- is chronic.   Usually has BM q1 week or so- usually on Colace BID at home with iron pill.  Usually takes Baclofen for spasticity.  Pain 3-4/10 with meds and 6/10 without meds- hasn't had any IV fentanyl since yesterday evening.  Got miralax last night- didn't work, but hasn't had BM in ~ 7-10 days.  Denies feeling constipated  ROS:  Pt denies SOB, abd pain, CP, N/V/C/D, and vision changes   Objective:   No results found. No results for input(s): "WBC", "HGB", "HCT", "PLT" in the last 72 hours. Recent Labs    06/21/22 0332  NA 130*  K 4.5  CL 100  CO2 24  GLUCOSE 101*  BUN 16  CREATININE 0.73  CALCIUM 8.7*    Intake/Output Summary (Last 24 hours) at 06/23/2022 0840 Last data filed at 06/23/2022 0800 Gross per 24 hour  Intake 720 ml  Output 900 ml  Net -180 ml        Physical Exam: Vital Signs Blood pressure 127/74, pulse 66, temperature 98.6 F (37 C), temperature source Oral, resp. rate 17, height 5\' 3"  (1.6 m), weight 50.5 kg, SpO2 97 %.    General: awake, alert, appropriate, frail appearing; sitting up in bed; BMI <20; NAD HENT: conjugate gaze; oropharynx dry- voice c/w vocal cord dysfunction CV: regular rate and rhythm; no JVD Pulmonary: CTA B/L; no W/R/R- good air movement GI: soft, NT, slightly distended vs protuberant; hypoactive BS Psychiatric: appropriate but flat Neurological: Ox3  Assessment/Plan: 1. Functional deficits which require 3+ hours per day of interdisciplinary therapy in a comprehensive inpatient rehab setting. Physiatrist is providing close team supervision and 24 hour management of active medical problems listed below. Physiatrist and rehab team continue to assess barriers to discharge/monitor patient progress toward functional and medical goals  Care Tool:  Bathing               Bathing assist       Upper Body Dressing/Undressing Upper body dressing        Upper body assist      Lower Body Dressing/Undressing Lower body dressing            Lower body assist       Toileting Toileting    Toileting assist       Transfers Chair/bed transfer  Transfers assist           Locomotion Ambulation   Ambulation assist              Walk 10 feet activity   Assist           Walk 50 feet activity   Assist           Walk 150 feet activity   Assist           Walk 10 feet on uneven surface  activity   Assist           Wheelchair     Assist               Wheelchair 50 feet with 2 turns activity  Assist            Wheelchair 150 feet activity     Assist          Blood pressure 127/74, pulse 66, temperature 98.6 F (37 C), temperature source Oral, resp. rate 17, height 5\' 3"  (1.6 m), weight 50.5 kg, SpO2 97 %.  Medical Problem List and Plan: 1. Functional deficits secondary to inferior pubic rami fracture and nondisplaced right anterior and posterior acetabular fractures.             -patient may shower             -ELOS/Goals: 8-12 days modI             First day of evaluations- Con't CIR PT and OT 2.  Antithrombotics: -DVT/anticoagulation:  Mechanical: Sequential compression devices, below knee Bilateral lower extremities and Lovenox 67meq daily.  3. Pain from multiple fractures:  4/1- pt was getting IV fentanyl- will change to PO Dilaudid 2 mg q4 hours prn- and change IV Toradol to 10 mg PO q8 hours prn- for next 4 days.  4. Insomnia: continue temazepam.  5. Neuropsych/cognition: This patient is capable of making decisions on her own behalf. 6. Iron deficiency anemia: continue daily iron supplement 7. Hypothyroidism: continue Synthroid 8. MS with chronic vocal cord paralyzed- has speech changes as a result: continue home baclofen.  9. GERD: continue PPI and H2 blocker 10.  Depression: continue daily prozac.   11. Chronic constipation  4/1- usually goes q ~1 week- has been longer than that and taking Pain meds- wil give Sorbitol after therapy today- no results with miralax or Colace.  12. IV- will d/c 4/2- if goes ok switching to PO meds-  13. Hyponatremia  4/1- Na last checked 2 days ago was 130- will recheck in AM CBC and BMP.    I spent a total of 39   minutes on total care today- >50% coordination of care- due to  D/w pt and nursing about changing IV pain meds to PO- since will interfere in therapy.  Also d/w nursing and pt about bowels.    LOS: 1 days A FACE TO FACE EVALUATION WAS PERFORMED  Asees Manfredi 06/23/2022, 8:40 AM

## 2022-06-24 DIAGNOSIS — S32592D Other specified fracture of left pubis, subsequent encounter for fracture with routine healing: Secondary | ICD-10-CM | POA: Diagnosis not present

## 2022-06-24 DIAGNOSIS — S32591D Other specified fracture of right pubis, subsequent encounter for fracture with routine healing: Secondary | ICD-10-CM | POA: Diagnosis not present

## 2022-06-24 DIAGNOSIS — S32401A Unspecified fracture of right acetabulum, initial encounter for closed fracture: Secondary | ICD-10-CM

## 2022-06-24 LAB — BASIC METABOLIC PANEL
Anion gap: 9 (ref 5–15)
BUN: 17 mg/dL (ref 8–23)
CO2: 26 mmol/L (ref 22–32)
Calcium: 8.9 mg/dL (ref 8.9–10.3)
Chloride: 95 mmol/L — ABNORMAL LOW (ref 98–111)
Creatinine, Ser: 0.81 mg/dL (ref 0.44–1.00)
GFR, Estimated: >60 mL/min (ref >60)
Glucose, Bld: 90 mg/dL (ref 70–99)
Potassium: 4.2 mmol/L (ref 3.5–5.1)
Sodium: 130 mmol/L — ABNORMAL LOW (ref 135–145)

## 2022-06-24 LAB — CBC WITH DIFFERENTIAL/PLATELET
Abs Immature Granulocytes: 0.02 10*3/uL (ref 0.00–0.07)
Basophils Absolute: 0 10*3/uL (ref 0.0–0.1)
Basophils Relative: 1 %
Eosinophils Absolute: 0.1 10*3/uL (ref 0.0–0.5)
Eosinophils Relative: 1 %
HCT: 32.8 % — ABNORMAL LOW (ref 36.0–46.0)
Hemoglobin: 11.1 g/dL — ABNORMAL LOW (ref 12.0–15.0)
Immature Granulocytes: 0 %
Lymphocytes Relative: 37 %
Lymphs Abs: 2.1 10*3/uL (ref 0.7–4.0)
MCH: 29.8 pg (ref 26.0–34.0)
MCHC: 33.8 g/dL (ref 30.0–36.0)
MCV: 87.9 fL (ref 80.0–100.0)
Monocytes Absolute: 0.6 10*3/uL (ref 0.1–1.0)
Monocytes Relative: 11 %
Neutro Abs: 2.8 10*3/uL (ref 1.7–7.7)
Neutrophils Relative %: 50 %
Platelets: 217 10*3/uL (ref 150–400)
RBC: 3.73 MIL/uL — ABNORMAL LOW (ref 3.87–5.11)
RDW: 15.5 % (ref 11.5–15.5)
Smear Review: NORMAL
WBC: 5.6 10*3/uL (ref 4.0–10.5)
nRBC: 0 % (ref 0.0–0.2)

## 2022-06-24 NOTE — Care Management (Signed)
[facility] Individual Statement of Services  Patient Name:  Kayla [name]  Date:  06/24/2022  Welcome to the [facility].  Our goal is to provide you with an individualized program based on your diagnosis and situation, designed to meet your specific needs.  With this comprehensive rehabilitation program, you will be expected to participate in at least 3 hours of rehabilitation therapies Monday-Friday, with modified therapy programming on the weekends.  Your rehabilitation program will include the following services:  Physical Therapy (PT), Occupational Therapy (OT), 24 hour per day rehabilitation nursing, Therapeutic Recreaction (TR), Psychology, Neuropsychology, Care Coordinator, Rehabilitation Medicine, [place], and Other  Weekly team conferences will be held on Tuesdays to discuss your progress.  Your Inpatient Rehabilitation Care Coordinator will talk with you frequently to get your input and to update you on team discussions.  Team conferences with you and your family in attendance may also be held.  Expected length of stay: 10-12 days    Overall anticipated outcome: Supervision  Depending on your progress and recovery, your program may change. Your Inpatient Rehabilitation Care Coordinator will coordinate services and will keep you informed of any changes. Your Inpatient Rehabilitation Care Coordinator's name and contact numbers are listed  below.  The following services may also be recommended but are not provided by the [facility] will be made to provide these services after discharge if needed.  Arrangements include referral to agencies that provide these services.  Your insurance has been verified to be:  Medicare A/B  Your primary doctor is:  Scarlette Calico  Pertinent information will be shared with your doctor and your insurance company.  Inpatient Rehabilitation Care Coordinator:  Cathleen Corti [phone] or [phone]  Information discussed with and copy given to patient by: Rana Snare, 06/24/2022, 9:23 AM

## 2022-06-24 NOTE — Progress Notes (Signed)
PROGRESS NOTE   Subjective/Complaints:  Pt reports they put her on O2 last night- doesn't know why- O2 sats 99-100%.   No BM yet. Took sorbitol yesterday.  Dilaudid helped some- but not sure if enough- will decide after therapy today.    ROS:  Pt denies SOB, abd pain, CP, N/V/ (+)C/D, and vision changes Except for HPI  Objective:   No results found. Recent Labs    06/24/22 0732  WBC 5.6  HGB 11.1*  HCT 32.8*  PLT 217   Recent Labs    06/24/22 0732  NA 130*  K 4.2  CL 95*  CO2 26  GLUCOSE 90  BUN 17  CREATININE 0.81  CALCIUM 8.9    Intake/Output Summary (Last 24 hours) at 06/24/2022 0849 Last data filed at 06/24/2022 0813 Gross per 24 hour  Intake 657 ml  Output 200 ml  Net 457 ml        Physical Exam: Vital Signs Blood pressure 104/62, pulse 65, temperature 98.2 F (36.8 C), temperature source Oral, resp. rate 16, height 5\' 3"  (1.6 m), weight 50.5 kg, SpO2 97 %.     General: awake, alert, appropriate, BMI 19; sitting up in bed; sats 100%- NAD HENT: conjugate gaze; oropharynx moist- on O2 by [place]- sats 100% raspy voice due to vocal cord dysfunction- chronic CV: regular rate and rhythm; no JVD Pulmonary: CTA B/L; no W/R/R- good air movement- upper airway sounds due to vocal cord dysfunction GI: soft, NT, slightly distended; hypoactive BS Psychiatric: appropriate- less flat today Neurological: Ox3 Some spasticity  Assessment/Plan: 1. Functional deficits which require 3+ hours per day of interdisciplinary therapy in a comprehensive inpatient rehab setting. Physiatrist is providing close team supervision and 24 hour management of active medical problems listed below. Physiatrist and rehab team continue to assess barriers to discharge/monitor patient progress toward functional and medical goals  Care Tool:  Bathing    Body parts bathed by patient: Right arm, Left arm, Chest, Abdomen, Front  perineal area, Buttocks, Right upper leg, Left upper leg, Face   Body parts bathed by helper: Right lower leg, Left lower leg     Bathing assist Assist Level: Minimal Assistance - Patient > 75%     Upper Body Dressing/Undressing Upper body dressing   What is the patient wearing?: Pull over shirt    Upper body assist Assist Level: Set up assist    Lower Body Dressing/Undressing Lower body dressing      What is the patient wearing?: Underwear/pull up, Pants     Lower body assist Assist for lower body dressing: Maximal Assistance - Patient 25 - 49%     Toileting Toileting    Toileting assist Assist for toileting: Maximal Assistance - Patient 25 - 49%     Transfers Chair/bed transfer  Transfers assist     Chair/bed transfer assist level: Minimal Assistance - Patient > 75%     Locomotion Ambulation   Ambulation assist      Assist level: Minimal Assistance - Patient > 75% Assistive device: Walker-rolling Max distance: 15'   Walk 10 feet activity   Assist     Assist level: Minimal Assistance - Patient > 75%  Assistive device: Walker-rolling   Walk 50 feet activity   Assist Walk 50 feet with 2 turns activity did not occur: Safety/medical concerns         Walk 150 feet activity   Assist Walk 150 feet activity did not occur: Safety/medical concerns         Walk 10 feet on uneven surface  activity   Assist Walk 10 feet on uneven surfaces activity did not occur: Safety/medical concerns         Wheelchair     Assist Is the patient using a wheelchair?: Yes Type of Wheelchair: Manual    Wheelchair assist level: Supervision/Verbal cueing Max wheelchair distance: 100'    Wheelchair 50 feet with 2 turns activity    Assist        Assist Level: Supervision/Verbal cueing   Wheelchair 150 feet activity     Assist      Assist Level: Moderate Assistance - Patient 50 - 74%   Blood pressure 104/62, pulse 65, temperature  98.2 F (36.8 C), temperature source Oral, resp. rate 16, height 5\' 3"  (1.6 m), weight 50.5 kg, SpO2 97 %.  Medical Problem List and Plan: 1. Functional deficits secondary to inferior pubic rami fracture and nondisplaced right anterior and posterior acetabular fractures.             -patient may shower             -ELOS/Goals: 8-12 days modI             Con't CIR PT and OT  Team conference today to determine length of stay 2.  Antithrombotics: -DVT/anticoagulation:  Mechanical: Sequential compression devices, below knee Bilateral lower extremities and Lovenox 53meq daily.  3. Pain from multiple fractures:  4/1- pt was getting IV fentanyl- will change to PO Dilaudid 2 mg q4 hours prn- and change IV Toradol to 10 mg PO q8 hours prn- for next 4 days.   4/2- d/w pt- Dilaudid somewhat helpful, but will tell me after therapy today if needs to increase dose- d/w nursing as well- could increase to 4 mg  4. Insomnia: continue temazepam.  5. Neuropsych/cognition: This patient is capable of making decisions on her own behalf. 6. Iron deficiency anemia: continue daily iron supplement 7. Hypothyroidism: continue Synthroid 8. MS with chronic vocal cord paralyzed- has speech changes as a result: continue home baclofen.  9. GERD: continue PPI and H2 blocker 10. Depression: continue daily prozac.   11. Chronic constipation  4/1- usually goes q ~1 week- has been longer than that and taking Pain meds- wil give Sorbitol after therapy today- no results with miralax or Colace. 4/2- no results with Sorbitol- will give Enema soap suds after therapy today at 3pm- hopefully will get good results.    12. IV- will d/c 4/2- if goes ok switching to PO meds-   4/2- tolerating PO meds- will stop IV 13. Hyponatremia  4/1- Na last checked 2 days ago was 130- will recheck in AM CBC and BMP.   4/2- Na still 130- will con't to monitor weekly   I spent a total of 51    minutes on total care today- >50% coordination of  care- due to  Due to team conference; as well as d/w nursing x2- to stop pulse ox, O2 is prn; and   LOS: 2 days A FACE TO FACE EVALUATION WAS PERFORMED  Alverto Shedd 06/24/2022, 8:49 AM

## 2022-06-24 NOTE — Progress Notes (Signed)
Physical Therapy Session Note  Patient Details  Name: Kayla [name] MRN: [id number] Date of Birth: Jun 04, 1950  Today's Date: 06/24/2022 PT Individual Time: 0804-0901 PT Individual Time Calculation (min): 57 min   Short Term Goals: Week 1:  PT Short Term Goal 1 (Week 1): Pt will perform functional transfers with CGA PT Short Term Goal 2 (Week 1): Pt will be able to inititate stair negotiation with mod assist PT Short Term Goal 3 (Week 1): Pt will be able to perform gait x 50' with CGA  Skilled Therapeutic Interventions/Progress Updates:    Pt presents in room in bed with RN present, reports pain in R hip however declines intervention at this time. Pt requests to don pants in supine, max assist for threading RLE through pant leg with pt demonstrating decreased ability to lift RLE, mod assist for threading LLE and pulls to below hips while in supine. Pt completes supine to sit EOB with min/mod assist with max elevated HOB, min assist for BLEs off bed (increased time to complete due to pain) and min/mod assist for trunk to upright. Pt completes sit<>stand transfer from EOB to RW with min assist, pulls pants over hips with mod assist with RUE support on RW and CGA for postural stability, pt demonstrating good maintenance of PWB 50% with sit<>stand and static stand with demonstrating equal weight distribution on BLEs. Pt requires sit to bed to don Bioness to RLE, assist for hooking on however pt placing device without assist. Pt manages device with turning on and utilizing app on phone, pt requests to bring phone to gym for continued management of device during therapy. Pt requests to brush teeth and ambulates ~10' from bed to sink with CGA, demonstrating good maintenance of 50% PWB with R stance phase with increased UE support on RW. Pt completes brushing teeth in standing with LUE support demonstrating good weightshift to LLE for standing balance with CGA, reaching for objects and manipulating toothbrush  and tooth paste without assist from therapist. Pt then ambulates backwards to WC, decreased speed good sequencing and UE weightbearing for RLE stance phase with backwards stepping. Pt requires increased time for all above activities secondary to fatigue, increased difficulty advancing BLEs, and pain.  Pt then transported dependently in [facility] for time management. Pt completes sit>stand from [facility] to RW with min assist and ambulates 72' with RW CGA/min assist however significantly decreased gait speed noted requiring significant time to complete. Verbal cues provided throughout gait for sequencing, stepping RLE into RW step to gait pattern to promote increased BUE support on RW to maintain PWB 50%. Pt positions prior to sitting with CGA and assist for placement of WC. Pt requires seated rest break prior to ambulation due to fatigue and pain. Pt provided with shorter RW to promote optimal UE weightbearing and efficiency needed for functional gait.  Pt then educated on shower chair method for negotiating stairs with RHR with therapist providing demonstration and education on maintaining PWB 50% with pt verbalizing understanding however unable to complete during session due to time management.  Pt returns to room dependently in [facility] for time management and remains seated in [facility] with all needs within reach and call light in place to await OT session at end of session.  Therapy Documentation Precautions:  Precautions Precautions: Fall Precaution Comments: h/o R foot drop; uses Bioness for all walking at home Restrictions Weight Bearing Restrictions: Yes RLE Weight Bearing: Partial weight bearing RLE Partial Weight Bearing Percentage or Pounds: 50%  Therapy/Group: Individual Therapy  Lorna Dibble 06/24/2022, 12:03 PM

## 2022-06-24 NOTE — Progress Notes (Signed)
Pt continues to refuse lidocaine patches. Verbalizes that they do not work. Sheela Stack, LPN

## 2022-06-24 NOTE — Progress Notes (Signed)
Patient ID: Kayla [name], female   DOB: 1951-02-25, 72 y.o.   MRN: [id number]  SW met with pt and pt husband to provide updates from team conference, and d/c date 4/9. SW informed on concerns reported by team including safety into the home in which a ramp was recommended, and getting in/out of the home during their family function in [place], [place]. After discussing in length, the pt and husband did not think a ramp was needed at this time, however, if pt progresses with disease will obtain. Pt does not feel she leaves the home enough for a ramp, and will not go anywhere as long as she is in pain. Also states their home has bilateral hand rails, and will purchase an additional shower chair if needed. States there will be support at vacation location to get in the home if needed. SW encouraged them to speak with PT about this as well. SW will confirm d/c needs.  Loralee Pacas, MSW, [place] Office: [phone] Cell: [phone] Fax: [phone]

## 2022-06-24 NOTE — Progress Notes (Signed)
Occupational Therapy Session Note  Patient Details  Name: Kayla [name] MRN: [id number] Date of Birth: Jan 23, 1951  Today's Date: 06/24/2022 OT Individual Time: [id number] OT Individual Time Calculation (min): 55 min    Short Term Goals: Week 1:  OT Short Term Goal 1 (Week 1): Pt will use reacher to don pants over feet with set up. OT Short Term Goal 2 (Week 1): Pt will use long sponge to wash feet with set up. OT Short Term Goal 3 (Week 1): Pt will pull pants over hips with min A and minA to guard her balance.  Skilled Therapeutic Interventions/Progress Updates:    Pt resting in w/c upon arrival. OT intervention with focus on walk-in shower transfers, side stepping with RW, safety awareness, and discharge planning. Pt verbalized WBing precautions. Min verbal cues to adhere to precautions. Pt's RW will not fit into bathroom door unless turned sideways. Pt used cabinets, etc to walk in bathroom prior to accident. Pt practiced stepping backwards into shower over 4" ledge with min A. Sidestepping with CGA. Pt requires more then a reasonable amount of time for all transitional movements and amb with RW. Pt requires extended rest breaks following activity. Pt returned to room and remained in w/c with all needs within reach.   Therapy Documentation Precautions:  Precautions Precautions: Fall Precaution Comments: h/o R foot drop; uses Bioness for all walking at home Restrictions Weight Bearing Restrictions: Yes RLE Weight Bearing: Partial weight bearing RLE Partial Weight Bearing Percentage or Pounds: 50%   Pain: Pain Assessment Pain Scale: 0-10 Pain Score: 4  Pain Location: Hip Pain Orientation: Right Pain Intervention(s): Meds admin prior to therapy; repositioned/rest    Therapy/Group: Individual Therapy  Leroy Libman 06/24/2022, 9:58 AM

## 2022-06-24 NOTE — Progress Notes (Addendum)
SSE given. No results. Mainly enema contents Sheela Stack, LPN

## 2022-06-24 NOTE — Progress Notes (Signed)
Physical Therapy Session Note  Patient Details  Name: Kayla [name] MRN: [id number] Date of Birth: [birth year]  Today's Date: 06/24/2022 PT Individual Time: 1135-1205, 1330-1430 PT Individual Time Calculation (min): 30 min, 60 min   Short Term Goals: Week 1:  PT Short Term Goal 1 (Week 1): Pt will perform functional transfers with CGA PT Short Term Goal 2 (Week 1): Pt will be able to inititate stair negotiation with mod assist PT Short Term Goal 3 (Week 1): Pt will be able to perform gait x 50' with CGA  Skilled Therapeutic Interventions/Progress Updates:    Session 1:  Pt seated in w/c on arrival and agreeable to therapy. Pt reports 3/10 pain on arrival and 4/10 after activity, premedicated. Rest and positioning provided as needed. Pt transported to therapy gym for time management and energy conservation.  Pt participated in stair navigation with shower chair and min A. Pt's husband present and assisted with several stairs with supervision and cues from therapist. Cues for using UE to maintain weightbearing precautions and limit hip flexion d/t history of R hip dislocation. Pt returned to room and stayed up in w/c for lunch, was left with all needs in reach and alarm active.   Session 2: Pt seated in w/c on arrival and agreeable to therapy. Pt reports 5/10 pain, nsg provided medication during session. Sit to stand with min-mod A throughout session d/t pain. ambulatory transfer to bathroom with CGA, clothing with min A, hygiene with CGA. Pt ambulated to sink, hand hygiene in standing. Pt then ambulated ~100 ft with RW and CGA, bioness active. Note slow gait speed and internal rotation at R hip. Shuffling with RLE, that pt could correct with cueing. Pt performed 3 x 10 LAQ with 1 lb ankle weight to maintain RLE strength while maintaining precautions. Pt returned to room and then to bed with mod A for BLE management. Doffed bioness tot A for energy conservation. Discussed recommendation to install  ramp, with pt and husband stating they would rather not at this time. Discussed that because she can use shower chair method for stair navigation, this is viable and if they have difficulty once getting home they can reassess.  Pt was left with all needs in reach and alarm active.   Therapy Documentation Precautions:  Precautions Precautions: Fall Precaution Comments: h/o R foot drop; uses Bioness for all walking at home Restrictions Weight Bearing Restrictions: Yes RLE Weight Bearing: Partial weight bearing RLE Partial Weight Bearing Percentage or Pounds: 50% General:      Therapy/Group: Individual Therapy  [name] 06/24/2022, 12:25 PM

## 2022-06-24 NOTE — Patient Care Conference (Signed)
Inpatient RehabilitationTeam Conference and Plan of Care Update Date: 06/25/2022   Time: 11:03 AM   Patient Name: Kayla [name]      Medical Record Number: [id number]  Date of Birth: 1950/11/21 Sex: Female         Room/Bed: 4M01C/4M01C-01 Payor Info: Payor: MEDICARE / Plan: MEDICARE PART A AND B / Product Type: *No Product type* /    Admit Date/Time:  06/22/2022 10:03 AM  Primary Diagnosis:  Bilateral pubic rami fractures  Hospital Problems: Principal Problem:   Bilateral pubic rami fractures Active Problems:   Acetabular fracture    Expected Discharge Date: Expected Discharge Date: 07/01/22  Team Members Present: Physician leading conference: Dr. Courtney Heys Social Worker Present: Loralee Pacas, [place] Nurse Present: Tacy Learn, RN PT Present: Ailene Rud, PT OT Present: Roanna Epley, [place], OT PPS Coordinator present : Ileana Ladd, PT     Current Status/Progress Goal Weekly Team Focus  Bowel/Bladder   Patient is Oliguria, bladder scan with I/O cath q and continent of Bowel last BM 06/23/22   (P) Will demonstate a new way to self catherizatize herself due to current status, preventing UTI   (P) Assess toiletiing needs QS/PRN    Swallow/Nutrition/ Hydration               ADL's   mod A LB bathing, dressing and toileting, min A ADL transfers   supervision with all self care   ADL training, functional mobility, AE training, pt education    Mobility   mod A bed mobility, min A transfers and gait, limited by pain and WB precautions   supervision gait and transfers, min A stairs  stairs, activity tolerance    Communication                Safety/Cognition/ Behavioral Observations               Pain   c/o right hip and back pain s/p fall with multiple fracture, Dilaudid prn   PAIN LESS 3/10,   Assess pain QS and prn medicate    Skin   Skin intact   Maintain skin integrity  Assess skin QS and prn      Discharge Planning:   D/c to home with pt with husband. SW will confirm there are no barriers to discharge.   Team Discussion: Bilateral ubic rami fractures. 50% PWB to RLE. Continent B/B with I/O caths BID per home schedule. Pain managed with PRN medications. May increase Dilaudid Laceration to to head with staples without drainage. ? CPAP. Soap suds enema today. Mod A for LB bathing/dressing/toileting. MinA transfers. Mod A bed mobility. Min A gait. Therapies limited by pain and WB precautions. No stairs at this time. ? Ramp.  Patient on target to meet rehab goals: yes, progressing towards goals  *See Care Plan and progress notes for long and short-term goals.   Revisions to Treatment Plan:  Medication adjustments   Teaching Needs: Medications, safety, self care, skin care, gait/transfer training, etc   Current Barriers to Discharge: Decreased caregiver support, Home enviroment access/layout, and Weight bearing restrictions  Possible Resolutions to Barriers: Family education, nursing education, order recommended DME     Medical Summary Current Status: Pain a major issue R hip- caths q12 hours at home and changed here to q12 hours- staples in head- RLE 50% PWB; Soap suds enema LBM 8-9 days ago  Barriers to Discharge: Weight bearing restrictions;Neurogenic Bowel & Bladder;Behavior/Mood;Electrolyte abnormality;Spasticity;Self-care education;Uncontrolled Pain  Barriers to Discharge Comments: pt  has to leave by 4/10 at latest- to travel for family event. - pain- might need to increase Dilaudid; Na 130- will recheck weekly; LBM 1+ week ago- Possible Resolutions to [employer]: Today soap suds enema for severe constipation- usually goes weekly at home; changed to Dilaudid from Fentanyl IV- d/c IV- limited by pain- PWB 50%- will change DIluadid to 2-4 mg depending on pain control in next 24 hours- might need ramp and w/c- will need to learn stairs to get to Family event- d/c 4/9   Continued Need for Acute  Rehabilitation Level of Care: The patient requires daily medical management by a physician with specialized training in physical medicine and rehabilitation for the following reasons: Direction of a multidisciplinary physical rehabilitation program to maximize functional independence : Yes Medical management of patient stability for increased activity during participation in an intensive rehabilitation regime.: Yes Analysis of laboratory values and/or radiology reports with any subsequent need for medication adjustment and/or medical intervention. : Yes   I attest that I was present, lead the team conference, and concur with the assessment and plan of the team.   Ernest Pine 06/25/2022, 10:26 AM

## 2022-06-25 DIAGNOSIS — S32591D Other specified fracture of right pubis, subsequent encounter for fracture with routine healing: Secondary | ICD-10-CM | POA: Diagnosis not present

## 2022-06-25 DIAGNOSIS — S32409D Unspecified fracture of unspecified acetabulum, subsequent encounter for fracture with routine healing: Secondary | ICD-10-CM | POA: Diagnosis not present

## 2022-06-25 DIAGNOSIS — S32592D Other specified fracture of left pubis, subsequent encounter for fracture with routine healing: Secondary | ICD-10-CM | POA: Diagnosis not present

## 2022-06-25 MED ORDER — HYDROMORPHONE HCL 2 MG PO TABS
4.0000 mg | ORAL_TABLET | ORAL | Status: DC | PRN
  Administered 2022-06-25 – 2022-06-30 (×12): 4 mg via ORAL
  Filled 2022-06-25 (×13): qty 2

## 2022-06-25 NOTE — Progress Notes (Signed)
PROGRESS NOTE   Subjective/Complaints:  Pt reports no need for O2.  Pain not controlled enough-  Did have small BM with enema-  Said had been using CPAP for OSA< but then had another study and told didn't have OSA, so stopped.     ROS:  Pt denies SOB, abd pain, CP, N/V/ (+)C/D, and vision changes  Except for HPI  Objective:   No results found. Recent Labs    06/24/22 0732  WBC 5.6  HGB 11.1*  HCT 32.8*  PLT 217   Recent Labs    06/24/22 0732  NA 130*  K 4.2  CL 95*  CO2 26  GLUCOSE 90  BUN 17  CREATININE 0.81  CALCIUM 8.9    Intake/Output Summary (Last 24 hours) at 06/25/2022 0829 Last data filed at 06/25/2022 0745 Gross per 24 hour  Intake 1030 ml  Output 700 ml  Net 330 ml        Physical Exam: Vital Signs Blood pressure 102/60, pulse 76, temperature 98.3 F (36.8 C), temperature source Oral, resp. rate 16, height 5\' 3"  (1.6 m), weight 50.5 kg, SpO2 93 %.      General: awake, alert, appropriate, supine in bed; frail; NAD HENT: conjugate gaze; oropharynx a little dry- vocal cord dysfunction heard CV: regular rate; no JVD Pulmonary: CTA B/L; no W/R/R- good air movement GI: soft, NT, less distended; hypoactive BS Psychiatric: appropriate- flat Neurological: Ox3  Some spasticity  Assessment/Plan: 1. Functional deficits which require 3+ hours per day of interdisciplinary therapy in a comprehensive inpatient rehab setting. Physiatrist is providing close team supervision and 24 hour management of active medical problems listed below. Physiatrist and rehab team continue to assess barriers to discharge/monitor patient progress toward functional and medical goals  Care Tool:  Bathing    Body parts bathed by patient: Right arm, Left arm, Chest, Abdomen, Front perineal area, Buttocks, Right upper leg, Left upper leg, Face   Body parts bathed by helper: Right lower leg, Left lower leg      Bathing assist Assist Level: Minimal Assistance - Patient > 75%     Upper Body Dressing/Undressing Upper body dressing   What is the patient wearing?: Pull over shirt    Upper body assist Assist Level: Set up assist    Lower Body Dressing/Undressing Lower body dressing      What is the patient wearing?: Underwear/pull up, Pants     Lower body assist Assist for lower body dressing: Maximal Assistance - Patient 25 - 49%     Toileting Toileting    Toileting assist Assist for toileting: Maximal Assistance - Patient 25 - 49%     Transfers Chair/bed transfer  Transfers assist     Chair/bed transfer assist level: Minimal Assistance - Patient > 75%     Locomotion Ambulation   Ambulation assist      Assist level: Minimal Assistance - Patient > 75% Assistive device: Walker-rolling Max distance: 15'   Walk 10 feet activity   Assist     Assist level: Minimal Assistance - Patient > 75% Assistive device: Walker-rolling   Walk 50 feet activity   Assist Walk 50 feet with 2 turns activity  did not occur: Safety/medical concerns         Walk 150 feet activity   Assist Walk 150 feet activity did not occur: Safety/medical concerns         Walk 10 feet on uneven surface  activity   Assist Walk 10 feet on uneven surfaces activity did not occur: Safety/medical concerns         Wheelchair     Assist Is the patient using a wheelchair?: Yes Type of Wheelchair: Manual    Wheelchair assist level: Supervision/Verbal cueing Max wheelchair distance: 100'    Wheelchair 50 feet with 2 turns activity    Assist        Assist Level: Supervision/Verbal cueing   Wheelchair 150 feet activity     Assist      Assist Level: Moderate Assistance - Patient 50 - 74%   Blood pressure 102/60, pulse 76, temperature 98.3 F (36.8 C), temperature source Oral, resp. rate 16, height 5\' 3"  (1.6 m), weight 50.5 kg, SpO2 93 %.  Medical Problem List  and Plan: 1. Functional deficits secondary to inferior pubic rami fracture and nondisplaced right anterior and posterior acetabular fractures.             -patient may shower             -ELOS/Goals: 8-12 days modI             Con't CIR PT and OT  D/c 4/9  2.  Antithrombotics: -DVT/anticoagulation:  Mechanical: Sequential compression devices, below knee Bilateral lower extremities and Lovenox 20meq daily.  3. Pain from multiple fractures:  4/1- pt was getting IV fentanyl- will change to PO Dilaudid 2 mg q4 hours prn- and change IV Toradol to 10 mg PO q8 hours prn- for next 4 days.   4/2- d/w pt- Dilaudid somewhat helpful, but will tell me after therapy today if needs to increase dose- d/w nursing as well- could increase to 4 mg   4/3- Will increase dilaudid to 4 mg q4 hours prn- and d/c Lidoderm- it's not helpful 4. Insomnia: continue temazepam.  5. Neuropsych/cognition: This patient is capable of making decisions on her own behalf. 6. Iron deficiency anemia: continue daily iron supplement 7. Hypothyroidism: continue Synthroid 8. MS with chronic vocal cord paralyzed- has speech changes as a result: continue home baclofen.  9. GERD: continue PPI and H2 blocker 10. Depression: continue daily prozac.   11. Chronic constipation  4/1- usually goes q ~1 week- has been longer than that and taking Pain meds- wil give Sorbitol after therapy today- no results with miralax or Colace. 4/2- no results with Sorbitol- will give Enema soap suds after therapy today at 3pm- hopefully will get good results.  4/3- had small BM with enema- but wants ot wait to do more intervention for 1 day  12. IV- will d/c 4/2- if goes ok switching to PO meds-   4/2- tolerating PO meds- will stop IV 13. Hyponatremia  4/1- Na last checked 2 days ago was 130- will recheck in AM CBC and BMP.   4/2- Na still 130- will con't to monitor weekly   I spent a total of 41   minutes on total care today- >50% coordination of care-  due to  D/w nursing about pain control as well as bowels and d/w pt- as well as IPOC   LOS: 3 days A FACE TO FACE EVALUATION WAS PERFORMED  Yohan Samons 06/25/2022, 8:29 AM

## 2022-06-25 NOTE — Progress Notes (Signed)
Physical Therapy Session Note  Patient Details  Name: Kayla [name] MRN: [id number] Date of Birth: 08-27-1950  Today's Date: 06/25/2022 PT Individual Time: [id number] + [id number] PT Individual Time Calculation (min): 45 min + 48 min  Short Term Goals: Week 1:  PT Short Term Goal 1 (Week 1): Pt will perform functional transfers with CGA PT Short Term Goal 2 (Week 1): Pt will be able to inititate stair negotiation with mod assist PT Short Term Goal 3 (Week 1): Pt will be able to perform gait x 50' with CGA  Skilled Therapeutic Interventions/Progress Updates:      SESSION 1: Pt presents in room in bed and agreeable to PT. Pt reports pain 4/10 at this time, declines intervention stating having received pain medication. Pt requires assist for donning pants in supine with max assist for threading pants and pulling to just below hips. Bioness donned with pt in supine, therapist completes dependently for time management. Pt completes supine to sit with max elevated HOB with mod assist for BLEs off bed, trunk to upright, and scooting to EOB. Pt completes sit<>stand with min assist demonstrating maintenance of PWB precautions with BUE support on RW. Pt requires max assist for pulling pants over hips while in standing with pt requiring BUE support on RW. Pt ambulates 5' to sink to complete self care tasks, completes with unilateral UE support on sink counter and increased weightshift to LLE to maintain precautions. Pt requires assist for putting toothpaste on toothbrush, demonstrates minimal forward trunk lean to spit following brushing teeth, therapist provides CGA for standing balance and facilitating dynamic standing balance with tactile cueing. Pt completes stand to sit in [facility] that therapist placed behind pt and pt transported dependently to main gym for time management.  Pt completes TUG with RW min assist to stand in 5:76min, verbal cueing provided for step to gait pattern leading with RLE to promote  optimal UE weightbearing on RW for R stance phase with pt inconsistent with technique, decreased foot clearance noted despite use of Bioness. Pt requires extended seated rest break due to fatigue following gait trial.  Pt provided with visual demonstration of step to gait pattern with education on BUE weightbearing to maintain PWB precautions with pt demonstrating understanding and good implementation of technique with pt completes forward/backward ambulation with RW 6', increased time to complete. Min verbal cues provided for sequencing step to gait pattern to promote optimal BUE weightbearing for R stance phase for both forward/backward gait.   Pt transported back to room dependently in [facility] for time management and remains seated in [facility] with all needs within reach, call light in place at end of session.  SESSION 2: Pt presents in room in [facility] and agreeable to PT. Pt reports pain 4/10. Pt transported to main gym dependently in [facility] for time management. Pt completes stand pivot transfer with min assist for stand, CGA once standing with RW from [facility] to offset shower chair on bottom step. Pt completes up/down 3 steps via shower chair method with RHR with min/mod assist from therapist. Pt manages BLEs onto next step with min assist from therapist for foot placement. Pt completes stand from shower chair onto next step with min/mod assist for gluteal clearance, pt pulling up with BUEs on RHR. Increased time to complete stair negotiation secondary to pt with slow motor initiation. Pt completes stand pivot transfer min assist RW shower chair to [facility] following stair negotiation. Pt reporting increased in pain to 6/10 following stair negotiation, RN notified  at end of session and present to administer pain meds. Pt completes therex in sitting to promote BLE strengthening and improved muscle fiber recruitment needed for functional transfers, ambulation, and bed mobility including: LAQs x10 BLE Heel slides x10 RLE (washrag under  R foot) Hip abduction x10 BLE (washrag under R foot and active assist to achieve full tolerable ROM)  Marching x10 LLE  Pt returns to room dependently in [facility] for time management and remains seated with all needs within reach, call light in place, RN present in room and pt husband at bedside at end of session.  Therapy Documentation Precautions:  Precautions Precautions: Fall Precaution Comments: h/o R foot drop; uses Bioness for all walking at home Restrictions Weight Bearing Restrictions: Yes RLE Weight Bearing: Partial weight bearing RLE Partial Weight Bearing Percentage or Pounds: 50% Balance: Balance Balance Assessed: Yes Standardized Balance Assessment Standardized Balance Assessment: Timed Up and Go Test Timed Up and Go Test TUG: Normal TUG Normal TUG (seconds): 354 (5:54 min to complete, requires min assist to stand to RW, CGA for ambulation)     Therapy/Group: Individual Therapy  Lorna Dibble 06/25/2022, 11:01 AM

## 2022-06-25 NOTE — Progress Notes (Signed)
Inpatient Rehabilitation Care Coordinator Assessment and Plan Patient Details  Name: Kayla [name] MRN: [id number] Date of Birth: 03-16-1951  Today's Date: 06/25/2022  Hospital Problems: Principal Problem:   Bilateral pubic rami fractures Active Problems:   Acetabular fracture  Past Medical History:  Past Medical History:  Diagnosis Date   Arthritis    oa   Cervical cancer ([place]) 2005   COPD (chronic obstructive pulmonary disease) ([place])    Multiple sclerosis ([place])    Multiple sclerosis ([place]) 1996   Neuromuscular disorder ([place])    Osteoporosis    Other diseases of vocal cords    vocal cords are not even   Other voice and resonance disorders    Pneumonia 2014, 1992   Sleep apnea    no cpap used   Unspecified hypothyroidism    Past Surgical History:  Past Surgical History:  Procedure Laterality Date   ABDOMINAL HYSTERECTOMY  03/25/2003   for cervical cancer, complete   BRONCHIAL BIOPSY  10/26/2020   Procedure: BRONCHIAL BIOPSIES;  Surgeon: Garner Nash, DO;  Location: [place] [facility];  Service: Pulmonary;;   BRONCHIAL BRUSHINGS  10/26/2020   Procedure: BRONCHIAL BRUSHINGS;  Surgeon: Garner Nash, DO;  Location: [facility];  Service: Pulmonary;;   BRONCHIAL WASHINGS  10/26/2020   Procedure: BRONCHIAL WASHINGS;  Surgeon: Garner Nash, DO;  Location: [place] [facility];  Service: Pulmonary;;   CATARACT EXTRACTION, BILATERAL Bilateral    lens for cataracts   CONVERSION TO TOTAL HIP Right 09/18/2015   Procedure: CONVERSION OF RIGHT HEMI ARTHROPLASTY TO TOTAL HIP ARTHROPLASTY ACETABULAR REVISON ;  Surgeon: Paralee Cancel, MD;  Location: [facility];  Service: Orthopedics;  Laterality: Right;   HIP ARTHROPLASTY Right 03/01/2013   Procedure: ARTHROPLASTY BIPOLAR HIP;  Surgeon: Mauri Pole, MD;  Location: [facility];  Service: Orthopedics;  Laterality: Right;   HIP CLOSED REDUCTION Right 03/12/2013   Procedure: CLOSED REDUCTION HIP;  Surgeon: Gearlean Alf, MD;  Location: [place];   Service: Orthopedics;  Laterality: Right;   HIP CLOSED REDUCTION Right 03/19/2013   Procedure: CLOSED REDUCTION HIP;  Surgeon: Johnn Hai, MD;  Location: [place];  Service: Orthopedics;  Laterality: Right;   HIP CLOSED REDUCTION Right 11/10/2015   Procedure: CLOSED MANIPULATION HIP;  Surgeon: Rod Can, MD;  Location: [facility];  Service: Orthopedics;  Laterality: Right;   HIP CLOSED REDUCTION Right 06/05/2017   Procedure: CLOSED REDUCTION HIP;  Surgeon: [name], MD;  Location: [facility];  Service: Orthopedics;  Laterality: Right;   SMALL INTESTINE SURGERY     TOTAL HIP REVISION Right 06/06/2017   Procedure: TOTAL HIP REVISION;  Surgeon: [name], MD;  Location: [facility];  Service: Orthopedics;  Laterality: Right;   VESICOVAGINAL FISTULA CLOSURE W/ TAH  03/25/2003   VIDEO BRONCHOSCOPY N/A 10/26/2020   Procedure: VIDEO BRONCHOSCOPY WITH FLUORO;  Surgeon: Garner Nash, DO;  Location: [place];  Service: Pulmonary;  Laterality: N/A;   Social History:  reports that she has never smoked. She has never used smokeless tobacco. She reports current alcohol use of about 2.0 standard drinks of alcohol per week. She reports that she does not use drugs.  Family / Support Systems Marital Status: Married How Long?: 40 years Patient Roles: Parent, Spouse Spouse/Significant Other: Kayla [name] (husband) Children: 2 adult daughters- Kayla [name] and Kayla [name] Other Supports: none reported Anticipated Caregiver: Husband Ability/Limitations of Caregiver: Pt husband will be primary caregiver. Caregiver Availability: 24/7 Family Dynamics: Pt lives with her husband.  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 Education: high school grad Health Literacy - How often do you need to have someone help you when you read instructions, pamphlets, or other written material from your  doctor or pharmacy?: Never Writes: Yes Employment Status: Retired Date Retired/Disabled/Unemployed: 1999 Public relations account executive Issues: Denies Guardian/Conservator: Primary school teacher (husband)   Abuse/Neglect Abuse/Neglect Assessment Can Be Completed: Yes Physical Abuse: Denies Verbal Abuse: Denies Sexual Abuse: Denies Exploitation of patient/patient's resources: Denies Self-Neglect: Denies  Patient response to: Social Isolation - How often do you feel lonely or isolated from those around you?: Never  Emotional Status Pt's affect, behavior and adjustment status: Pt in good spirits at time of visit. Recent Psychosocial Issues: Denies Psychiatric History: Pt reports she had been placed on depression medication due to MS years ago. Feels this has allowed her to be stable. Indicates she does not worry about much of anything. Substance Abuse History: Denies tobacco product use and rec drug use. Pt report soccassional etoh use.  Patient / Family Perceptions, Expectations & Goals Pt/Family understanding of illness & functional limitations: Pt and family have a general understanding of pt care needs Premorbid pt/family roles/activities: Independent Anticipated changes in roles/activities/participation: Assistance with ADLs/IADLs Pt/family expectations/goals: Pt gola is to continue to work with PT as much as possible.  Community Resources [employer]s: None Premorbid Home Care/DME Agencies: None Transportation available at discharge: Husband Is the patient able to respond to transportation needs?: Yes In the past 12 months, has lack of transportation kept you from medical appointments or from getting medications?: No In the past 12 months, has lack of transportation kept you from meetings, work, or from getting things needed for daily living?: No Resource referrals recommended: Neuropsychology  Discharge Planning Living Arrangements: Spouse/significant other Support Systems:  Spouse/significant other Type of Residence: Private residence Insurance Resources: Chartered certified accountant Resources: [place] Referred: No Living Expenses: Own Money Management: Spouse Does the patient have any problems obtaining your medications?: No Home Management: Pt husband manages homecare needs Patient/Family Preliminary Plans: No changes Care Coordinator Barriers to Discharge: Decreased caregiver support, Lack of/limited family support, Insurance for SNF coverage Care Coordinator Anticipated Follow Up Needs: HH/OP Expected length of stay: 10-12 days  Clinical Impression Pt is not a veteran. HCPOA- husband. DME: RW, has shower seat in shower.   Rana Snare 06/25/2022, 9:39 AM

## 2022-06-25 NOTE — IPOC Note (Signed)
Overall Plan of Care [facility]) Patient Details Name: Kayla [name] MRN: [id number] DOB: Jun 18, 1950  Admitting Diagnosis: Bilateral pubic rami fractures  Hospital Problems: Principal Problem:   Bilateral pubic rami fractures Active Problems:   Acetabular fracture     Functional Problem List: Nursing Bowel, Endurance, Medication Management, Pain, Safety, Skin Integrity  PT Balance, Endurance, Edema, Motor, Pain, Skin Integrity  OT Balance, Endurance, Motor, Pain  SLP    TR         Basic ADL's: OT Bathing, Dressing, Toileting     Advanced  ADL's: OT       Transfers: PT Bed Mobility, Bed to Chair, Car, Manufacturing systems engineer, Metallurgist: PT Emergency planning/management officer, Ambulation, Stairs     Additional Impairments: OT None  SLP        TR      Anticipated Outcomes Item Anticipated Outcome  Self Feeding no goal, pt is independent  Swallowing      Basic self-care  supervision  Toileting  supervision   Bathroom Transfers supervision  Bowel/Bladder  Mod I, and no constipation  Transfers  supervision transfers  Locomotion  supervision gait; min assist stair negotiation  Communication     Cognition     Pain  <3  Safety/Judgment  Mod I, and no falls   Therapy Plan: PT Intensity: Minimum of 1-2 x/day ,45 to 90 minutes PT Frequency: 5 out of 7 days PT Duration Estimated Length of Stay: 10-12 days OT Intensity: Minimum of 1-2 x/day, 45 to 90 minutes OT Frequency: 5 out of 7 days OT Duration/Estimated Length of Stay: 10-12 days     Team Interventions: Nursing Interventions Patient/Family Education, Medication Management, Skin Care/Wound Management, Bowel Management, Pain Management, Discharge Planning  PT interventions Ambulation/gait training, Balance/vestibular training, Community reintegration, Discharge planning, Disease management/prevention, DME/adaptive equipment instruction, Functional electrical stimulation, Functional mobility training,  Neuromuscular re-education, Pain management, Patient/family education, Psychosocial support, Skin care/wound management, Splinting/orthotics, Stair training, Therapeutic Activities, Therapeutic Exercise, UE/LE Strength taining/ROM, UE/LE Coordination activities, Wheelchair propulsion/positioning  OT Interventions Training and development officer, DME/adaptive equipment instruction, Discharge planning, Functional mobility training, Psychosocial support, Patient/family education, Pain management, Self Care/advanced ADL retraining, UE/LE Strength taining/ROM, Therapeutic Exercise, Therapeutic Activities  SLP Interventions    TR Interventions    SW/CM Interventions Psychosocial Support, Patient/Family Education   Barriers to Discharge MD  Medical stability, Home enviroment access/loayout, Lack of/limited family support, Weight, and Weight bearing restrictions  Nursing Wound Care 1 level, 4 STE, 2 rails. Spouse/family available 24/7  PT Home environment access/layout, Weight bearing restrictions PWB status with 4 steps to enter  OT      SLP      SW       Team Discharge Planning: Destination: PT-Home ,OT- Home , SLP-  Projected Follow-up: PT-Outpatient PT, Home health PT, OT-  Home health OT, SLP-  Projected Equipment Needs: PT-To be determined, OT- To be determined, SLP-  Equipment Details: PT-pt already owns RW, rollator, electric scooter and transport w/c, OT-pt has some AE and DME at home. Patient/family involved in discharge planning: PT- Patient,  OT-Patient, SLP-   MD ELOS: 8-10 days Medical Rehab Prognosis:  Good Assessment: The patient has been admitted for CIR therapies with the diagnosis of B/L pubic rami fx and acetabular fx. The team will be addressing functional mobility, strength, stamina, balance, safety, adaptive techniques and equipment, self-care, bowel and bladder mgt, patient and caregiver education, . Goals have been set at supervision. Anticipated discharge destination is  home.  See Team Conference Notes for weekly updates to the plan of care

## 2022-06-25 NOTE — Progress Notes (Signed)
Occupational Therapy Session Note  Patient Details  Name: Kayla [name] MRN: [id number] Date of Birth: Feb 04, 1951  Today's Date: 06/25/2022 OT Individual Time: 0930-1040 OT Individual Time Calculation (min): 70 min    Short Term Goals: Week 1:  OT Short Term Goal 1 (Week 1): Pt will use reacher to don pants over feet with set up. OT Short Term Goal 2 (Week 1): Pt will use long sponge to wash feet with set up. OT Short Term Goal 3 (Week 1): Pt will pull pants over hips with min A and minA to guard her balance.  Skilled Therapeutic Interventions/Progress Updates:    Pt resting in w/c upon arrival. Pt reports she already donned clean clothing prior to earlier therapy. OT intervention with focus on BUE therex, standing balance, sit<>stand, and funcitonal amb with RW to increase independence with BADLs. BUE therex with 3# bar-chest presses 3x10 and ball taps 3x10 with extended rest breaks between sets. Standing activities-cornhole x2 with rest break and standing to clean bean bags while maintaining WB precautions. Amb 41' with RW at [facility]. Min verbal cues to extend RLE when sitting down. Pt returned to room and remained in w/c with all needs within reach.   Therapy Documentation Precautions:  Precautions Precautions: Fall Precaution Comments: h/o R foot drop; uses Bioness for all walking at home Restrictions Weight Bearing Restrictions: Yes RLE Weight Bearing: Partial weight bearing RLE Partial Weight Bearing Percentage or Pounds: 50% :   Pain: Pt c/o Rt hip pain-3/10 at rest and 4/10 with activity; meds admin prior to therapy; emotional support Therapy/Group: Individual Therapy  Leroy Libman 06/25/2022, 10:44 AM

## 2022-06-25 NOTE — Progress Notes (Signed)
Occupational Therapy Session Note  Patient Details  Name: Kayla [name] MRN: [id number] Date of Birth: [birth year]  Today's Date: 06/25/2022 OT Individual Time: 1345-1425 OT Individual Time Calculation (min): 40 min    Short Term Goals: Week 1:  OT Short Term Goal 1 (Week 1): Pt will use reacher to don pants over feet with set up. OT Short Term Goal 2 (Week 1): Pt will use long sponge to wash feet with set up. OT Short Term Goal 3 (Week 1): Pt will pull pants over hips with min A and minA to guard her balance.  Skilled Therapeutic Interventions/Progress Updates:    OT intervention with focus on bathing at shower level and dressing with sit<>from w/c. Stand pivot TTB tranfsfer with min A using grab bars and requiring extra time to complete tranfser. Bathing with min A. Min A for stand pivot transfer to w/c. Pt required assistance threading BLE into pants without use of AE. Sit<>stand and stand pivot transfer to EOB with supervision. Sit>supine with assistance for BLE management. Pt reamined in bed with all needs within reach. Bed alarm activated.   Therapy Documentation Precautions:  Precautions Precautions: Fall Precaution Comments: h/o R foot drop; uses Bioness for all walking at home Restrictions Weight Bearing Restrictions: Yes RLE Weight Bearing: Partial weight bearing RLE Partial Weight Bearing Percentage or Pounds: 50% Pain: Pain Assessment Pain Scale: 0-10 Pain Score: 6  Pain Location: Hip Pain Orientation: Right Pain Intervention(s): warm shower, repositioned, and meds requested of RN   Therapy/Group: Individual Therapy  Leroy Libman 06/25/2022, 2:42 PM

## 2022-06-26 DIAGNOSIS — S32591D Other specified fracture of right pubis, subsequent encounter for fracture with routine healing: Secondary | ICD-10-CM | POA: Diagnosis not present

## 2022-06-26 DIAGNOSIS — S32592D Other specified fracture of left pubis, subsequent encounter for fracture with routine healing: Secondary | ICD-10-CM | POA: Diagnosis not present

## 2022-06-26 DIAGNOSIS — S32409D Unspecified fracture of unspecified acetabulum, subsequent encounter for fracture with routine healing: Secondary | ICD-10-CM | POA: Diagnosis not present

## 2022-06-26 MED ORDER — SORBITOL 70 % SOLN
45.0000 mL | Freq: Once | Status: AC
  Administered 2022-06-26: 45 mL via ORAL
  Filled 2022-06-26: qty 60

## 2022-06-26 NOTE — Progress Notes (Signed)
Occupational Therapy Session Note  Patient Details  Name: Kayla [name] MRN: [id number] Date of Birth: 1950/05/18  Today's Date: 06/26/2022 OT Individual Time: [id number] OT Individual Time Calculation (min): 75 min    Short Term Goals: Week 1:  OT Short Term Goal 1 (Week 1): Pt will use reacher to don pants over feet with set up. OT Short Term Goal 2 (Week 1): Pt will use long sponge to wash feet with set up. OT Short Term Goal 3 (Week 1): Pt will pull pants over hips with min A and minA to guard her balance.  Skilled Therapeutic Interventions/Progress Updates:    OT intervention with focus on BUE strengthening to increase independence with BADLs and general consition, standing balance, and ongoing discharge planning. Sit<>stand x 5 with min A. Static standing balance with CGA. BUE circuit with 3# bar reaching out and up. Ball taps with 3# bar 3x12. Biceps curls with 2kg ball 3x12. Pt returned to room and remained in w/c. All needs within reach.   Therapy Documentation Precautions:  Precautions Precautions: Fall Precaution Comments: h/o R foot drop; uses Bioness for all walking at home Restrictions Weight Bearing Restrictions: Yes RLE Weight Bearing: Partial weight bearing RLE Partial Weight Bearing Percentage or Pounds: 50   Pain: Pt c/o 4/10 Rt hip pain which increases when she coughs; heat applied at end of session   Therapy/Group: Individual Therapy  Leroy Libman 06/26/2022, 11:46 AM

## 2022-06-26 NOTE — Progress Notes (Signed)
Physical Therapy Session Note  Patient Details  Name: Kayla [name] MRN: [id number] Date of Birth: 1950-07-09  Today's Date: 06/26/2022 PT Individual Time: 0800-0900 PT Individual Time Calculation (min): 60 min   Short Term Goals: Week 1:  PT Short Term Goal 1 (Week 1): Pt will perform functional transfers with CGA PT Short Term Goal 2 (Week 1): Pt will be able to inititate stair negotiation with mod assist PT Short Term Goal 3 (Week 1): Pt will be able to perform gait x 50' with CGA  Skilled Therapeutic Interventions/Progress Updates:    pt received in bed and agreeable to therapy. Pt reports 4/10 pain, premedicated. Rest and positioning provided as needed. Bed mobility with min A for RLE pain management. Pt assisted with bioness set up and donning. Changed shirt EOB with set up. Pt stood from elevated surface min A and walked in room distances CGA. Sit to stand throughout with min-mod A from w/c. Stood at sink >2 min with supervision to brush teeth and comb hair, no knee buckling or LOB noted. Pt then transported to therapy gym for time. Pt performed step taps on 4" step, with cueing to maintain WB precautions with RW, x 5 alternating then x 5 RLE only, x 2 bouts with standing rest break. Pt then directed in elevated "lunges" with RLE, maintaining majority weight bearing on LLE for improved RLE hip flexion and cues for R hip ER. Pt returned to room and remained in w/c, set up with hot packs for pain management, was left with all needs in reach and alarm active.   Therapy Documentation Precautions:  Precautions Precautions: Fall Precaution Comments: h/o R foot drop; uses Bioness for all walking at home Restrictions Weight Bearing Restrictions: Yes RLE Weight Bearing: Partial weight bearing RLE Partial Weight Bearing Percentage or Pounds: 50 General:        Therapy/Group: Individual Therapy  [name] 06/26/2022, 12:21 PM

## 2022-06-26 NOTE — Progress Notes (Signed)
PROGRESS NOTE   Subjective/Complaints:  Pt reports dry in here- so thinks that's why has stuffy nose.  No allergies per pt.   No BM since enema which was small BM.  Pain somewhat better  ROS:   Pt denies SOB, abd pain, CP, N/V/ (+)C/D, and vision changes   Except for HPI  Objective:   No results found. Recent Labs    06/24/22 0732  WBC 5.6  HGB 11.1*  HCT 32.8*  PLT 217   Recent Labs    06/24/22 0732  NA 130*  K 4.2  CL 95*  CO2 26  GLUCOSE 90  BUN 17  CREATININE 0.81  CALCIUM 8.9    Intake/Output Summary (Last 24 hours) at 06/26/2022 0851 Last data filed at 06/26/2022 0700 Gross per 24 hour  Intake 592 ml  Output 380 ml  Net 212 ml        Physical Exam: Vital Signs Blood pressure (!) 124/58, pulse 76, temperature 98.5 F (36.9 C), temperature source Oral, resp. rate 20, height 5\' 3"  (1.6 m), weight 50.5 kg, SpO2 92 %.       General: awake, alert, appropriate, lying in bed; ate <50% of meal; NAD HENT: conjugate gaze; oropharynx moist- vocal cord dysfunction- nose stuffy sounding- talking nasally as a result CV: regular rate; no JVD Pulmonary: CTA B/L; no W/R/R- good air movement GI: soft, NT, slightly distended; hypoactive BS Psychiatric: appropriate- a little flat Neurological: Ox3  Some spasticity  Assessment/Plan: 1. Functional deficits which require 3+ hours per day of interdisciplinary therapy in a comprehensive inpatient rehab setting. Physiatrist is providing close team supervision and 24 hour management of active medical problems listed below. Physiatrist and rehab team continue to assess barriers to discharge/monitor patient progress toward functional and medical goals  Care Tool:  Bathing    Body parts bathed by patient: Right arm, Left arm, Chest, Abdomen, Front perineal area, Buttocks, Right upper leg, Left upper leg, Face   Body parts bathed by helper: Right arm, Left  arm, Chest, Abdomen, Right upper leg, Left upper leg, Front perineal area Body parts n/a: Buttocks, Right lower leg, Left lower leg   Bathing assist Assist Level: Minimal Assistance - Patient > 75%     Upper Body Dressing/Undressing Upper body dressing   What is the patient wearing?: Pull over shirt    Upper body assist Assist Level: Set up assist    Lower Body Dressing/Undressing Lower body dressing      What is the patient wearing?: Underwear/pull up, Pants     Lower body assist Assist for lower body dressing: Moderate Assistance - Patient 50 - 74%     Toileting Toileting    Toileting assist Assist for toileting: Maximal Assistance - Patient 25 - 49%     Transfers Chair/bed transfer  Transfers assist     Chair/bed transfer assist level: Minimal Assistance - Patient > 75%     Locomotion Ambulation   Ambulation assist      Assist level: Minimal Assistance - Patient > 75% Assistive device: Walker-rolling Max distance: 15'   Walk 10 feet activity   Assist     Assist level: Minimal Assistance - Patient >  75% Assistive device: Walker-rolling   Walk 50 feet activity   Assist Walk 50 feet with 2 turns activity did not occur: Safety/medical concerns         Walk 150 feet activity   Assist Walk 150 feet activity did not occur: Safety/medical concerns         Walk 10 feet on uneven surface  activity   Assist Walk 10 feet on uneven surfaces activity did not occur: Safety/medical concerns         Wheelchair     Assist Is the patient using a wheelchair?: Yes Type of Wheelchair: Manual    Wheelchair assist level: Supervision/Verbal cueing Max wheelchair distance: 100'    Wheelchair 50 feet with 2 turns activity    Assist        Assist Level: Supervision/Verbal cueing   Wheelchair 150 feet activity     Assist      Assist Level: Moderate Assistance - Patient 50 - 74%   Blood pressure (!) 124/58, pulse 76,  temperature 98.5 F (36.9 C), temperature source Oral, resp. rate 20, height 5\' 3"  (1.6 m), weight 50.5 kg, SpO2 92 %.  Medical Problem List and Plan: 1. Functional deficits secondary to inferior pubic rami fracture and nondisplaced right anterior and posterior acetabular fractures.             -patient may shower             -ELOS/Goals: 8-12 days modI             d/c 4/9  Con't CIR PT and OT 2.  Antithrombotics: -DVT/anticoagulation:  Mechanical: Sequential compression devices, below knee Bilateral lower extremities and Lovenox 95meq daily.  3. Pain from multiple fractures:  4/1- pt was getting IV fentanyl- will change to PO Dilaudid 2 mg q4 hours prn- and change IV Toradol to 10 mg PO q8 hours prn- for next 4 days.   4/2- d/w pt- Dilaudid somewhat helpful, but will tell me after therapy today if needs to increase dose- d/w nursing as well- could increase to 4 mg   4/3- Will increase dilaudid to 4 mg q4 hours prn- and d/c Lidoderm- it's not helpful  4/4- pain doing somewhat better-  4. Insomnia: continue temazepam.  5. Neuropsych/cognition: This patient is capable of making decisions on her own behalf. 6. Iron deficiency anemia: continue daily iron supplement 7. Hypothyroidism: continue Synthroid 8. MS with chronic vocal cord paralyzed- has speech changes as a result: continue home baclofen.  9. GERD: continue PPI and H2 blocker 10. Depression: continue daily prozac.   11. Chronic constipation  4/1- usually goes q ~1 week- has been longer than that and taking Pain meds- wil give Sorbitol after therapy today- no results with miralax or Colace. 4/2- no results with Sorbitol- will give Enema soap suds after therapy today at 3pm- hopefully will get good results.  4/3- had small BM with enema- but wants ot wait to do more intervention for 1 day  4/4- will give Sorbitol and Soap suds enema today after therapy 12. IV- will d/c 4/2- if goes ok switching to PO meds-   4/2- tolerating PO meds-  will stop IV 13. Hyponatremia  4/1- Na last checked 2 days ago was 130- will recheck in AM CBC and BMP.   4/2- Na still 130- will con't to monitor weekly   I spent a total of 35   minutes on total care today- >50% coordination of care- due to  D/w nursing  about bowels and changing to regular diet per pt request.    LOS: 4 days A FACE TO FACE EVALUATION WAS PERFORMED  Kayla [name] 06/26/2022, 8:51 AM

## 2022-06-26 NOTE — Progress Notes (Signed)
Physical Therapy Session Note  Patient Details  Name: Kayla [name] MRN: [id number] Date of Birth: 18-Feb-1951  Today's Date: 06/26/2022 PT Individual Time: 1300-1415 PT Individual Time Calculation (min): 75 min   Short Term Goals: Week 1:  PT Short Term Goal 1 (Week 1): Pt will perform functional transfers with CGA PT Short Term Goal 2 (Week 1): Pt will be able to inititate stair negotiation with mod assist PT Short Term Goal 3 (Week 1): Pt will be able to perform gait x 50' with CGA  Skilled Therapeutic Interventions/Progress Updates:    Pt seated in w/c on arrival and agreeable to therapy. Pt reports 5/10 pain on arrival and 7/10 pain after activity. RN aware and provided pain medication after session.  Attempted stair transfer, but pt reports too much pain sitting on hard shower seat.  Transitioned to gait training and pt ambulated ~120 ft with RW and CGA. Cueing for increased foot clearance. Pt then requests seated exercise d/t pain.  Pt performed the following exercises to promote LE strength and endurance:  Seated LAQ, improved ROM with LLE over RLE, but neither full range, 4 x 6 Seated calf raises, 3 x 10 At this time, pt reports feeling hot. Provided with cool compress and water, but pt continues to report high pain levels and then light headedness. Pt transported to room and assisted to bed with mod A d/t fatigue. Pt remained in bed and reported feeling somewhat better. Pt was left with all needs in reach and alarm active, her husband present.   Therapy Documentation Precautions:  Precautions Precautions: Fall Precaution Comments: h/o R foot drop; uses Bioness for all walking at home Restrictions Weight Bearing Restrictions: Yes RLE Weight Bearing: Partial weight bearing RLE Partial Weight Bearing Percentage or Pounds: 50 General:      Therapy/Group: Individual Therapy  [name] 06/26/2022, 1:45 PM

## 2022-06-26 NOTE — Progress Notes (Signed)
Soap sud enema performed and patient tolerated well but no BM occurred.   Patient is clean and dry, eating at bedside with husband. Call-bell is within reach and other safety measures implemented.

## 2022-06-27 ENCOUNTER — Inpatient Hospital Stay (HOSPITAL_COMMUNITY): Payer: Medicare Other

## 2022-06-27 DIAGNOSIS — S32592D Other specified fracture of left pubis, subsequent encounter for fracture with routine healing: Secondary | ICD-10-CM | POA: Diagnosis not present

## 2022-06-27 DIAGNOSIS — S32591D Other specified fracture of right pubis, subsequent encounter for fracture with routine healing: Secondary | ICD-10-CM | POA: Diagnosis not present

## 2022-06-27 DIAGNOSIS — S32401A Unspecified fracture of right acetabulum, initial encounter for closed fracture: Secondary | ICD-10-CM | POA: Diagnosis not present

## 2022-06-27 MED ORDER — FLUTICASONE-UMECLIDIN-VILANT 100-62.5-25 MCG/ACT IN AEPB
1.0000 | INHALATION_SPRAY | Freq: Every day | RESPIRATORY_TRACT | Status: DC
  Administered 2022-06-28 – 2022-07-01 (×4): 1 via RESPIRATORY_TRACT

## 2022-06-27 MED ORDER — MIRABEGRON ER 25 MG PO TB24
25.0000 mg | ORAL_TABLET | ORAL | Status: DC
  Administered 2022-06-28 – 2022-06-30 (×2): 25 mg via ORAL
  Filled 2022-06-27 (×2): qty 1

## 2022-06-27 MED ORDER — POLYETHYLENE GLYCOL 3350 17 GM/SCOOP PO POWD
0.5000 | Freq: Once | ORAL | Status: AC
  Administered 2022-06-29: 127.5 g via ORAL
  Filled 2022-06-27: qty 255

## 2022-06-27 MED ORDER — DICLOFENAC SODIUM 75 MG PO TBEC
75.0000 mg | DELAYED_RELEASE_TABLET | Freq: Two times a day (BID) | ORAL | Status: DC
  Administered 2022-06-27 – 2022-07-01 (×9): 75 mg via ORAL
  Filled 2022-06-27 (×9): qty 1

## 2022-06-27 MED ORDER — METHOCARBAMOL 500 MG PO TABS
500.0000 mg | ORAL_TABLET | Freq: Four times a day (QID) | ORAL | Status: DC
  Administered 2022-06-27 – 2022-07-01 (×17): 500 mg via ORAL
  Filled 2022-06-27 (×17): qty 1

## 2022-06-27 NOTE — Progress Notes (Signed)
Occupational Therapy Session Note  Patient Details  Name: Kayla [name] MRN: [id number] Date of Birth: Oct 27, 1950  Today's Date: 06/27/2022 OT Individual Time: 0240-9735 OT Individual Time Calculation (min): 75 min    Short Term Goals: Week 1:  OT Short Term Goal 1 (Week 1): Pt will use reacher to don pants over feet with set up. OT Short Term Goal 2 (Week 1): Pt will use long sponge to wash feet with set up. OT Short Term Goal 3 (Week 1): Pt will pull pants over hips with min A and minA to guard her balance.   Skilled Therapeutic Interventions/Progress Updates:    Pt resting in bed upon arrival. Pt reports she just returned from xray. Pt with increase in coughing this morning which triggers increased Rt hip pain. Supine>sit EOB with min A. Pt reqruired mod A for LB dressing this morning wihtout use of AE. Sit<>stand with min A this morning 2/2 increased pain and coughing which interrupts sequencing for sit<>stand. Stand pivot tranfser to w/c with CGA. BUE therex/activities seated in w/c in gym. Therex interrupted 2/2 extended coughing spells. Pt returned to room and requested to sit in recliner. Sit<>stand from w/c with min A. Stand pivot transfer to recliner with CGA. Pt remained in recliner with all needs within reach.   Therapy Documentation Precautions:  Precautions Precautions: Fall Precaution Comments: h/o R foot drop; uses Bioness for all walking at home Restrictions Weight Bearing Restrictions: Yes RLE Weight Bearing: Partial weight bearing RLE Partial Weight Bearing Percentage or Pounds: 50  Pain:  Pt reports 4/10 Rt hip pain escalating when she coughs; repositioned   Therapy/Group: Individual Therapy  Rich Brave 06/27/2022, 12:17 PM

## 2022-06-27 NOTE — Plan of Care (Signed)
  Problem: RH Dressing Goal: LTG Patient will perform lower body dressing w/assist (OT) Description: LTG: Patient will perform lower body dressing with assist, with/without cues in positioning using equipment (OT) Flowsheets (Taken 06/27/2022 1557) LTG: Pt will perform lower body dressing with assistance level of: (downgraded JLS) Minimal Assistance - Patient > 75% Note: Downgraded JLS

## 2022-06-27 NOTE — Progress Notes (Signed)
Occupational Therapy Session Note  Patient Details  Name: Kayla [name] MRN: [id number] Date of Birth: 07/17/1950  Today's Date: 06/27/2022 OT Individual Time: 1315-1400 OT Individual Time Calculation (min): 45 min  and Today's Date: 06/27/2022 OT Missed Time: 15 Minutes Missed Time Reason: Other (comment) (eating lunch)   Short Term Goals: Week 1:  OT Short Term Goal 1 (Week 1): Pt will use reacher to don pants over feet with set up. OT Short Term Goal 2 (Week 1): Pt will use long sponge to wash feet with set up. OT Short Term Goal 3 (Week 1): Pt will pull pants over hips with min A and minA to guard her balance.  Skilled Therapeutic Interventions/Progress Updates:    Pt missed 15 mins at beginning of session so pt could finish eating lunch. Pt reports her pain is better this afternoon. OT intervention with focus on sit<>stand, standing balance, functional transfers, and education in preparation for discharge home. Pt has lift chair at home that she uses when she is "weaker" and requires help. Sit<>stand from w/c with CGA. Stand pivot transfer with supervision. Discussed getting bed rails for use at home to provide surface to push up from and assist with bed mobility. Pt already owns [facility] and shower seat. Pt's husband encourage by progress.   Therapy Documentation Precautions:  Precautions Precautions: Fall Precaution Comments: h/o R foot drop; uses Bioness for all walking at home Restrictions Weight Bearing Restrictions: Yes RLE Weight Bearing: Partial weight bearing RLE Partial Weight Bearing Percentage or Pounds: 50 General: General OT Amount of Missed Time: 15 Minutes Pain:  Pt c/o 4/10 Rt hip pain, increasing when coughing; repositioned   Therapy/Group: Individual Therapy  Rich Brave 06/27/2022, 2:39 PM

## 2022-06-27 NOTE — Progress Notes (Addendum)
PROGRESS NOTE   Subjective/Complaints:  Pees on self when coughs, but having difficulty peeing during day in between her q12 hours caths.   Having severe sharp, stabbing pain in hip whenever she coughs.  Got pain meds at 6am- doesn't think had kicked in at 7am when seen [facility] usually really bad when wakes up.  Worse last 24-48 hours.  Toradol has stopped since been 5 days.    ROS:  Pt denies SOB, abd pain, CP, N/V/C/D, and vision changes Except for HPI    Objective:   DG Abd 1 View  Result Date: 06/27/2022 CLINICAL DATA:  Constipation by delayed colonic transit EXAM: ABDOMEN - 1 VIEW COMPARISON:  11/08/2020 abdominal CT FINDINGS: Formed stool distends the proximal colon and transverse colon. Gas distends the descending colon. No concerning mass effect or gas collection. Ingested pill seen over the left upper quadrant/stomach. Prior pelvic lymphadenectomy. Right hip replacement. Streaky density at the lung bases seen on comparison CT is well. IMPRESSION: Stool distended proximal and transverse colon. Electronically Signed   By: Tiburcio Pea M.D.   On: 06/27/2022 08:10   No results for input(s): "WBC", "HGB", "HCT", "PLT" in the last 72 hours.  No results for input(s): "NA", "K", "CL", "CO2", "GLUCOSE", "BUN", "CREATININE", "CALCIUM" in the last 72 hours.   Intake/Output Summary (Last 24 hours) at 06/27/2022 0859 Last data filed at 06/27/2022 0615 Gross per 24 hour  Intake 238 ml  Output 700 ml  Net -462 ml        Physical Exam: Vital Signs Blood pressure (!) 98/56, pulse 65, temperature 98.2 F (36.8 C), temperature source Oral, resp. rate 20, height 5\' 3"  (1.6 m), weight 50.5 kg, SpO2 93 %.        General: awake, alert, appropriate, sitting up slightly in bed- a little intermittent cough- crying out in pain when she has pain; NAD HENT: conjugate gaze; oropharynx dry- vocal cord dysfunction- more raspy sounding  this AM CV: regular rate; no JVD Pulmonary: CTA B/L; no W/R/R- good air movement- except for upper airway sounds from vocal cord issues GI: soft, NT, slightly distended; hypoactive BS- only ate part of banana of breakfast Psychiatric: appropriate- flat Neurological: Ox3  Some spasticity  Assessment/Plan: 1. Functional deficits which require 3+ hours per day of interdisciplinary therapy in a comprehensive inpatient rehab setting. Physiatrist is providing close team supervision and 24 hour management of active medical problems listed below. Physiatrist and rehab team continue to assess barriers to discharge/monitor patient progress toward functional and medical goals  Care Tool:  Bathing    Body parts bathed by patient: Right arm, Left arm, Chest, Abdomen, Front perineal area, Buttocks, Right upper leg, Left upper leg, Face   Body parts bathed by helper: Right arm, Left arm, Chest, Abdomen, Right upper leg, Left upper leg, Front perineal area Body parts n/a: Buttocks, Right lower leg, Left lower leg   Bathing assist Assist Level: Minimal Assistance - Patient > 75%     Upper Body Dressing/Undressing Upper body dressing   What is the patient wearing?: Pull over shirt    Upper body assist Assist Level: Set up assist    Lower Body Dressing/Undressing Lower  body dressing      What is the patient wearing?: Underwear/pull up, Pants     Lower body assist Assist for lower body dressing: Moderate Assistance - Patient 50 - 74%     Toileting Toileting    Toileting assist Assist for toileting: Maximal Assistance - Patient 25 - 49%     Transfers Chair/bed transfer  Transfers assist     Chair/bed transfer assist level: Minimal Assistance - Patient > 75%     Locomotion Ambulation   Ambulation assist      Assist level: Minimal Assistance - Patient > 75% Assistive device: Walker-rolling Max distance: 15'   Walk 10 feet activity   Assist     Assist level: Minimal  Assistance - Patient > 75% Assistive device: Walker-rolling   Walk 50 feet activity   Assist Walk 50 feet with 2 turns activity did not occur: Safety/medical concerns         Walk 150 feet activity   Assist Walk 150 feet activity did not occur: Safety/medical concerns         Walk 10 feet on uneven surface  activity   Assist Walk 10 feet on uneven surfaces activity did not occur: Safety/medical concerns         Wheelchair     Assist Is the patient using a wheelchair?: Yes Type of Wheelchair: Manual    Wheelchair assist level: Supervision/Verbal cueing Max wheelchair distance: 100'    Wheelchair 50 feet with 2 turns activity    Assist        Assist Level: Supervision/Verbal cueing   Wheelchair 150 feet activity     Assist      Assist Level: Moderate Assistance - Patient 50 - 74%   Blood pressure (!) 98/56, pulse 65, temperature 98.2 F (36.8 C), temperature source Oral, resp. rate 20, height 5\' 3"  (1.6 m), weight 50.5 kg, SpO2 93 %.  Medical Problem List and Plan: 1. Functional deficits secondary to inferior pubic rami fracture and nondisplaced right anterior and posterior acetabular fractures.             -patient may shower             -ELOS/Goals: 8-12 days modI             d/c 4/9  Con't CIR PT and OT 2.  Antithrombotics: -DVT/anticoagulation:  Mechanical: Sequential compression devices, below knee Bilateral lower extremities and Lovenox daily.  3. Pain from multiple fractures:  4/1- pt was getting IV fentanyl- will change to PO Dilaudid 2 mg q4 hours prn- and change IV Toradol to 10 mg PO q8 hours prn- for next 4 days.   4/2- d/w pt- Dilaudid somewhat helpful, but will tell me after therapy today if needs to increase dose- d/w nursing as well- could increase to 4 mg   4/3- Will increase dilaudid to 4 mg q4 hours prn- and d/c Lidoderm- it's not helpful  4/5- having sharp, stabbing pain in hip- from fracture- will schedule  Robaxin 500 mg QID and add Diclofenac 75 mg BID since Toradol stopped- appropriate stopping-  4. Insomnia: continue temazepam.  5. Neuropsych/cognition: This patient is capable of making decisions on her own behalf. 6. Iron deficiency anemia: continue daily iron supplement 7. Hypothyroidism: continue Synthroid 8. MS with chronic vocal cord paralyzed- has speech changes as a result: continue home baclofen.  9. GERD: continue PPI and H2 blocker 10. Depression: continue daily prozac.   11. Chronic constipation  4/1- usually goes  q ~1 week- has been longer than that and taking Pain meds- wil give Sorbitol after therapy today- no results with miralax or Colace. 4/2- no results with Sorbitol- will give Enema soap suds after therapy today at 3pm- hopefully will get good results.  4/3- had small BM with enema- but wants ot wait to do more intervention for 1 day  4/4- will give Sorbitol and Soap suds enema today after therapy 4/5- will get KUB and intervene as needed- KUB shows formed stool in transverse and proximal colon- will d/w PA and determine what the next avenue is 12. IV- will d/c 4/2- if goes ok switching to PO meds-   4/2- tolerating PO meds- will stop IV 13. Hyponatremia  4/1- Na last checked 2 days ago was 130- will recheck in AM CBC and BMP.   4/2- Na still 130- will con't to monitor weekly 13. Urinary incontinence  4/5- will decrease Myrbetriq to QOD- since having some more urinary retention than did- has been on Myrbetriq for 6 weeks has tried Oxybutynin without good success in past.    I spent a total of  51  minutes on total care today- >50% coordination of care- due to  Prolonged time with pt- and review of KUB as well as d/w nurse about med changes.  Will d/w PA to determine next intevention  LOS: 5 days A FACE TO FACE EVALUATION WAS PERFORMED  Kilie Rund 06/27/2022, 8:59 AM

## 2022-06-27 NOTE — Progress Notes (Signed)
Occupational Therapy Discharge Summary  Patient Details  Name: Kayla [name] MRN: [id number] Date of Birth: [birth year]  Date of Discharge from OT service:June 29, 2022   Patient has met 7 of 7 long term goals due to improved activity tolerance, improved balance, ability to compensate for deficits, functional use of  RIGHT lower extremity, and improved coordination.  Pt made steady progress with BADLs and functional tranfers during this admission. Pt requires min A for LB dressing tasks but completes all other BADLs with supervision and extra time. Pt with constanct Rt hip pain but pt able to complete tasks with extra time. Pt's husband has been present for therapy and provides the appropriate level of assistance/supervision. Patient to discharge at overall Supervision level.  Patient's care partner is independent to provide the necessary physical assistance at discharge.    Reasons goals not met: n/a  Recommendation:  Patient will benefit from ongoing skilled PT services in outpatient setting to continue to advance functional skills in the area of mobility.  Equipment: No equipment provided  Reasons for discharge: treatment goals met and discharge from hospital  Patient/family agrees with progress made and goals achieved: Yes  OT Discharge ADL ADL Equipment Provided: Reacher, Long-handled sponge Eating: Independent Grooming: Independent Upper Body Bathing: Setup Where Assessed-Upper Body Bathing: Shower Lower Body Bathing: Supervision/safety Where Assessed-Lower Body Bathing: Shower Upper Body Dressing: Setup Where Assessed-Upper Body Dressing: Wheelchair Lower Body Dressing: Minimal assistance Where Assessed-Lower Body Dressing: Wheelchair, Sitting at sink, Standing at sink Toileting: Supervision/safety Where Assessed-Toileting: Bedside Commode, Actuary Transfer: Close supervision Toilet Transfer Method: Surveyor, minerals: Raised toilet seat,  Bedside commode Film/video editor: Administrator, arts Method: Warden/ranger: Emergency planning/management officer, Grab bars Vision Baseline Vision/History: 1 Wears glasses Patient Visual Report: No change from baseline Vision Assessment?: No apparent visual deficits Perception  Perception: Within Functional Limits Praxis Praxis: Intact Cognition Cognition Overall Cognitive Status: Within Functional Limits for tasks assessed Arousal/Alertness: Awake/alert Orientation Level: Person;Place;Situation Person: Oriented Place: Oriented Situation: Oriented Memory: Appears intact Awareness: Appears intact Problem Solving: Appears intact Safety/Judgment: Appears intact Brief Interview for Mental Status (BIMS) Repetition of Three Words (First Attempt): 3 Temporal Orientation: Year: Correct Temporal Orientation: Month: Accurate within 5 days Temporal Orientation: Day: Correct Recall: "Sock": Yes, no cue required Recall: "Blue": Yes, no cue required Recall: "Bed": Yes, no cue required BIMS Summary Score: 15 Sensation Sensation Light Touch: Appears Intact Hot/Cold: Appears Intact Proprioception: Appears Intact Stereognosis: Not tested Coordination Gross Motor Movements are Fluid and Coordinated: Yes Fine Motor Movements are Fluid and Coordinated: Yes Motor  Motor Motor: Abnormal postural alignment and control;Other (comment) Motor - Skilled Clinical Observations: pain limiting, stiff movements overall, decreased coordination Mobility  Bed Mobility Rolling Right: Independent Supine to Sit: Independent Sit to Supine: Supervision/Verbal cueing Transfers Sit to Stand: Supervision/Verbal cueing Stand to Sit: Supervision/Verbal cueing  Trunk/Postural Assessment  Cervical Assessment Cervical Assessment: Within Functional Limits Thoracic Assessment Thoracic Assessment: Within Functional Limits Lumbar Assessment Lumbar Assessment: Within Functional  Limits Postural Control Postural Control: Deficits on evaluation (decreased balance reactions)  Balance Static Sitting Balance Static Sitting - Level of Assistance: 5: Stand by assistance Dynamic Sitting Balance Dynamic Sitting - Level of Assistance: 5: Stand by assistance Extremity/Trunk Assessment RUE Assessment RUE Assessment: Within Functional Limits LUE Assessment LUE Assessment: Within Functional Limits   Rich Brave 06/27/2022, 3:12 PM

## 2022-06-27 NOTE — Progress Notes (Addendum)
Has worsening cough per PT. She denies feeling SOB or ill. She is OOB to chair and husband is at bedside  Lungs with scattered crackles, occasional wheezes bilaterally, some of this is upper airway noise. At 1301 SaO2 95% on room air. Temp 97.9  Agrees to chest x-ray to rule out possible early aspiration pneumonia. Will hold off on Miralax "bowel prep" for constipation until more information/monitoring of cough. Will place aspiration precautions order and may need speech eval. Will ask her if she wants to start using her Trelegy ellipta. Per notes, as history of cough variant asthma versus vocal cord dysfunction related to MS. Follows with Dr. Marchelle Gearing who is planning CT scan in May. See below:  Progress note 05/05/2022:  "napatient consult 10/25/20   72 year old female with chronic cough.  She is a longstanding patient of Dr. Sandrea Hughs.  Since 2355-7322 she has had multiple sclerosis.  She had a hip fracture in 2004 and after that she has used a walker.  In 2006 she had a EGD with 3 cm hiatal hernia.  She is always had a stridorous noise with deep inspiration.  She has a 15-year history of dysphagia.  2008 at esophagogram was normal.  In May 2022 she saw Dr. Christia Reading and was noticed to have bilateral vocal fold paralysis.  She was admitted 09/27/2020 with history of fever but also chronic cough for 4 months.  She had failed outpatient antibiotics and steroids.  CT chest showed some scattered groundglass opacities [personally visualized] particularly in the left lower lobe."  Plan:     Patient Instructions        ICD-10-CM    1. Chronic cough  R05.3       2. DOE (dyspnea on exertion)  R06.09       3. Cough variant asthma vs vcd  related to MS   J45.991       4. Vocal cord paralysis  J38.00       5. History of pneumonia  Z87.01             Chronic multi-year problem Glad 25% better after seeing Dr Delford Field at [facility] and following his advice Blood allergy test negative aug 2023  but glad trelegy is helping you CT chest in Oct 2023 with LLL bronchial debris   NEw acute runny nose  - sick contact from husband   Plan - repeat CT chest without contrast in 3-4 months as followup from CT in Oct 2023 - continue trelegy - check covid, flu, RSV NAA nasal swab 05/05/2022       Followup  - 3-4 months but after CT chest

## 2022-06-27 NOTE — Progress Notes (Signed)
Patient ID: Kayla [name], female   DOB: 05-25-1950, 72 y.o.   MRN: [id number]  SW met with pt and pt husband in room at the end of PT session. Discussed outpatient preferred location. Prefers [facility] or [facility]. SW will confirm if these locations offer PT and OT.   SW left message for [facility] (p:[phone]) and waiting on follow-up to confirm if OT is offered.   [facility] (p:[phone]/f:[phone]) offers PT/OT. Requires a referral and approval from physician at clinic.   Cecile Sheerer, MSW, LCSWA Office: [phone] Cell: [phone] Fax: [phone]

## 2022-06-27 NOTE — Progress Notes (Signed)
Physical Therapy Session Note  Patient Details  Name: TAIS BASCH MRN: [id number] Date of Birth: 07-05-1950  Today's Date: 06/27/2022 PT Individual Time: 1435-1530 PT Individual Time Calculation (min): 55 min   Short Term Goals: Week 1:  PT Short Term Goal 1 (Week 1): Pt will perform functional transfers with CGA PT Short Term Goal 2 (Week 1): Pt will be able to inititate stair negotiation with mod assist PT Short Term Goal 3 (Week 1): Pt will be able to perform gait x 50' with CGA  Skilled Therapeutic Interventions/Progress Updates:    Pt seated in w/c on arrival and agreeable to therapy. PA, Dois Davenport present for consult regarding cough/congestion. Pt reports elevated pain levels this pm, premedicated. Rest and positioning provided as needed. Session focused on family education with pt's husband Homero Fellers. Discussed home set up and planned outings. Pt and Homero Fellers state they feel comfortable with previous education on stairs, so did not repeat today d/t pain. Pt performed ambulatory transfer to bed with min A to stand and CGA, therapist providing supervision with Homero Fellers providing assist. Mod A to return to bed. Increased time for all mobility d/t pain. Therapist then provided colon massage for constipation relief, pt reports some gas, but improved pressure in abdomen. Pt then remained in bed and was left with all needs in reach and alarm active.   Therapy Documentation Precautions:  Precautions Precautions: Fall Precaution Comments: h/o R foot drop; uses [employer] for all walking at home Restrictions Weight Bearing Restrictions: Yes RLE Weight Bearing: Partial weight bearing RLE Partial Weight Bearing Percentage or Pounds: 50 General:      Therapy/Group: Individual Therapy  Juluis Rainier 06/27/2022, 3:44 PM

## 2022-06-28 DIAGNOSIS — S32401A Unspecified fracture of right acetabulum, initial encounter for closed fracture: Secondary | ICD-10-CM | POA: Diagnosis not present

## 2022-06-28 DIAGNOSIS — S32591D Other specified fracture of right pubis, subsequent encounter for fracture with routine healing: Secondary | ICD-10-CM | POA: Diagnosis not present

## 2022-06-28 DIAGNOSIS — S32592D Other specified fracture of left pubis, subsequent encounter for fracture with routine healing: Secondary | ICD-10-CM | POA: Diagnosis not present

## 2022-06-28 MED ORDER — AZITHROMYCIN 500 MG PO TABS
500.0000 mg | ORAL_TABLET | Freq: Every day | ORAL | Status: AC
  Administered 2022-06-28 – 2022-06-30 (×3): 500 mg via ORAL
  Filled 2022-06-28 (×3): qty 1

## 2022-06-28 MED ORDER — SORBITOL 70 % SOLN
60.0000 mL | Freq: Once | Status: AC
  Administered 2022-06-28: 60 mL via ORAL
  Filled 2022-06-28: qty 60

## 2022-06-28 MED ORDER — BENZONATATE 100 MG PO CAPS
100.0000 mg | ORAL_CAPSULE | Freq: Three times a day (TID) | ORAL | Status: DC
  Administered 2022-06-28 – 2022-07-01 (×10): 100 mg via ORAL
  Filled 2022-06-28 (×10): qty 1

## 2022-06-28 NOTE — Progress Notes (Signed)
PROGRESS NOTE   Subjective/Complaints:  Pt reports cough is new/getting a little worse Thinks Trelegy maybe made things slightly better but not a lot so far.  CXR shows beginings of pneumonia- R mid lung base Didn't do bowel meds yesterday- willing to do today since off therapy today.   ROS:  Pt denies SOB, abd pain, CP, N/V/ (+)C/D, and vision changes New cough that's worse and chronic constipation Except for HPI    Objective:   DG Chest 2 View  Result Date: 06/27/2022 CLINICAL DATA:  Cough. EXAM: CHEST - 2 VIEW COMPARISON:  06/19/2022 and older FINDINGS: Underinflation. Left basilar opacities slightly improved with some residual. There is some increasing right basilar opacity but worsening inflation today. Subtle right midlung opacity as well. No pneumothorax or effusion. No edema. Stable cardiopericardial silhouette. Lordotic x-ray. IMPRESSION: Worsening inflation with increasing right mid right lung base opacity. Decreasing left lung base opacity with some residual. Recommend continued follow-up Electronically Signed   By: Karen Kays M.D.   On: 06/27/2022 17:21   DG Abd 1 View  Result Date: 06/27/2022 CLINICAL DATA:  Constipation by delayed colonic transit EXAM: ABDOMEN - 1 VIEW COMPARISON:  11/08/2020 abdominal CT FINDINGS: Formed stool distends the proximal colon and transverse colon. Gas distends the descending colon. No concerning mass effect or gas collection. Ingested pill seen over the left upper quadrant/stomach. Prior pelvic lymphadenectomy. Right hip replacement. Streaky density at the lung bases seen on comparison CT is well. IMPRESSION: Stool distended proximal and transverse colon. Electronically Signed   By: Tiburcio Pea M.D.   On: 06/27/2022 08:10   No results for input(s): "WBC", "HGB", "HCT", "PLT" in the last 72 hours.  No results for input(s): "NA", "K", "CL", "CO2", "GLUCOSE", "BUN", "CREATININE",  "CALCIUM" in the last 72 hours.   Intake/Output Summary (Last 24 hours) at 06/28/2022 1026 Last data filed at 06/28/2022 5366 Gross per 24 hour  Intake 437 ml  Output 925 ml  Net -488 ml        Physical Exam: Vital Signs Blood pressure (!) 108/59, pulse 63, temperature 98.1 F (36.7 C), resp. rate 19, height 5\' 3"  (1.6 m), weight 50.5 kg, SpO2 94 %.        General: awake, alert, appropriate, sitting up in bed; NAD HENT: conjugate gaze; oropharynx dry; more vocal cord sounds from paralysis CV: regular rate; no JVD Pulmonary: decreased in R lung base and significantly more upper airway sounds with vocal cord dysfunction sounds worse GI: soft, NT, slightly distended still and hypoactive BS Psychiatric: appropriate- more interactive- still flat Neurological: Ox3 Some spasticity  Assessment/Plan: 1. Functional deficits which require 3+ hours per day of interdisciplinary therapy in a comprehensive inpatient rehab setting. Physiatrist is providing close team supervision and 24 hour management of active medical problems listed below. Physiatrist and rehab team continue to assess barriers to discharge/monitor patient progress toward functional and medical goals  Care Tool:  Bathing    Body parts bathed by patient: Right arm, Left arm, Chest, Abdomen, Front perineal area, Buttocks, Right upper leg, Left upper leg, Face   Body parts bathed by helper: Right arm, Left arm, Chest, Abdomen, Right upper leg, Left  upper leg, Front perineal area Body parts n/a: Buttocks, Right lower leg, Left lower leg   Bathing assist Assist Level: Minimal Assistance - Patient > 75%     Upper Body Dressing/Undressing Upper body dressing   What is the patient wearing?: Pull over shirt    Upper body assist Assist Level: Set up assist    Lower Body Dressing/Undressing Lower body dressing      What is the patient wearing?: Underwear/pull up, Pants     Lower body assist Assist for lower body  dressing: Moderate Assistance - Patient 50 - 74%     Toileting Toileting    Toileting assist Assist for toileting: Maximal Assistance - Patient 25 - 49%     Transfers Chair/bed transfer  Transfers assist     Chair/bed transfer assist level: Minimal Assistance - Patient > 75%     Locomotion Ambulation   Ambulation assist      Assist level: Minimal Assistance - Patient > 75% Assistive device: Walker-rolling Max distance: 15'   Walk 10 feet activity   Assist     Assist level: Minimal Assistance - Patient > 75% Assistive device: Walker-rolling   Walk 50 feet activity   Assist Walk 50 feet with 2 turns activity did not occur: Safety/medical concerns         Walk 150 feet activity   Assist Walk 150 feet activity did not occur: Safety/medical concerns         Walk 10 feet on uneven surface  activity   Assist Walk 10 feet on uneven surfaces activity did not occur: Safety/medical concerns         Wheelchair     Assist Is the patient using a wheelchair?: Yes Type of Wheelchair: Manual    Wheelchair assist level: Supervision/Verbal cueing Max wheelchair distance: 100'    Wheelchair 50 feet with 2 turns activity    Assist        Assist Level: Supervision/Verbal cueing   Wheelchair 150 feet activity     Assist      Assist Level: Moderate Assistance - Patient 50 - 74%   Blood pressure (!) 108/59, pulse 63, temperature 98.1 F (36.7 C), resp. rate 19, height 5\' 3"  (1.6 m), weight 50.5 kg, SpO2 94 %.  Medical Problem List and Plan: 1. Functional deficits secondary to inferior pubic rami fracture and nondisplaced right anterior and posterior acetabular fractures.             -patient may shower             -ELOS/Goals: 8-12 days modI             d/c 4/9  Con't CIR PT and OT- today is weekend day off.  2.  Antithrombotics: -DVT/anticoagulation:  Mechanical: Sequential compression devices, below knee Bilateral lower  extremities and Lovenox daily.  3. Pain from multiple fractures:  4/1- pt was getting IV fentanyl- will change to PO Dilaudid 2 mg q4 hours prn- and change IV Toradol to 10 mg PO q8 hours prn- for next 4 days.   4/2- d/w pt- Dilaudid somewhat helpful, but will tell me after therapy today if needs to increase dose- d/w nursing as well- could increase to 4 mg   4/3- Will increase dilaudid to 4 mg q4 hours prn- and d/c Lidoderm- it's not helpful  4/5- having sharp, stabbing pain in hip- from fracture- will schedule Robaxin 500 mg QID and add Diclofenac 75 mg BID since Toradol stopped- appropriate stopping-  4/6- pain a little better today 4. Insomnia: continue temazepam.  5. Neuropsych/cognition: This patient is capable of making decisions on her own behalf. 6. Iron deficiency anemia: continue daily iron supplement 7. Hypothyroidism: continue Synthroid 8. MS with chronic vocal cord paralyzed- has speech changes as a result: continue home baclofen.  9. GERD: continue PPI and H2 blocker 10. Depression: continue daily prozac.   11. Chronic constipation  4/1- usually goes q ~1 week- has been longer than that and taking Pain meds- wil give Sorbitol after therapy today- no results with miralax or Colace. 4/2- no results with Sorbitol- will give Enema soap suds after therapy today at 3pm- hopefully will get good results.  4/3- had small BM with enema- but wants ot wait to do more intervention for 1 day  4/4- will give Sorbitol and Soap suds enema today after therapy 4/5- will get KUB and intervene as needed- KUB shows formed stool in transverse and proximal colon- will d/w PA and determine what the next avenue is  4/6- will try another dose of Sorbitol but 60cc- and Enema- if doesn't work, wrote nursing to let me know and will give Mg citrate 12. IV- will d/c 4/2- if goes ok switching to PO meds-   4/2- tolerating PO meds- will stop IV 13. Hyponatremia  4/1- Na last checked 2 days ago was 130-  will recheck in AM CBC and BMP.   4/2- Na still 130- will con't to monitor weekly 13. Urinary incontinence  4/5- will decrease Myrbetriq to QOD- since having some more urinary retention than did- has been on Myrbetriq for 6 weeks has tried Oxybutynin without good success in past.  14. Pneumonia- R middle lobe  4/6- spoke with pharmacy- they agreed with my Zithromax- will do 500 mg daily x 3 days- wait to start Prednisone since had difficulty with Methylprednisone in past- will d/w pt again- and add tessalon pearles for cough 100 mg TID scheduled.    I spent a total of 52   minutes on total care today- >50% coordination of care- due to  D/w Pharmacy as well as independent review of CXR and d/w nursing about bowels.    LOS: 6 days A FACE TO FACE EVALUATION WAS PERFORMED  Zalea Pete 06/28/2022, 10:26 AM

## 2022-06-28 NOTE — Progress Notes (Signed)
Sorbitol and SSE given. No results with sorbitol so enema administered. Enema produced small bowel movement. Disimpaction required. Liquid stool returned with digital stimulation. Small stool returned. Pt tolerated well. Mylo Red, LPN

## 2022-06-29 DIAGNOSIS — S32409D Unspecified fracture of unspecified acetabulum, subsequent encounter for fracture with routine healing: Secondary | ICD-10-CM | POA: Diagnosis not present

## 2022-06-29 DIAGNOSIS — S32592D Other specified fracture of left pubis, subsequent encounter for fracture with routine healing: Secondary | ICD-10-CM | POA: Diagnosis not present

## 2022-06-29 DIAGNOSIS — S32591D Other specified fracture of right pubis, subsequent encounter for fracture with routine healing: Secondary | ICD-10-CM | POA: Diagnosis not present

## 2022-06-29 MED ORDER — PROCHLORPERAZINE MALEATE 5 MG PO TABS
10.0000 mg | ORAL_TABLET | Freq: Four times a day (QID) | ORAL | Status: DC | PRN
  Administered 2022-06-29: 10 mg via ORAL
  Filled 2022-06-29: qty 2

## 2022-06-29 MED ORDER — CAPSAICIN 0.025 % EX CREA
TOPICAL_CREAM | Freq: Four times a day (QID) | CUTANEOUS | Status: DC | PRN
  Filled 2022-06-29: qty 60

## 2022-06-29 MED ORDER — CAPSAICIN 0.075 % EX CREA: TOPICAL_CREAM | Freq: Four times a day (QID) | CUTANEOUS | Status: DC | PRN

## 2022-06-29 NOTE — Progress Notes (Signed)
Occupational Therapy Session Note  Patient Details  Name: Kayla [name] MRN: [id number] Date of Birth: 15-Mar-1951  Today's Date: 06/29/2022 OT Individual Time: 8270-7867 OT Individual Time Calculation (min): 55 min    Short Term Goals: Week 1:  OT Short Term Goal 1 (Week 1): Pt will use reacher to don pants over feet with set up. OT Short Term Goal 2 (Week 1): Pt will use long sponge to wash feet with set up. OT Short Term Goal 3 (Week 1): Pt will pull pants over hips with min A and minA to guard her balance.  Skilled Therapeutic Interventions/Progress Updates:    Pt semi reclined in bed, requesting to shower this AM.  Pt completed supine to sit, sit to stand at RW, ambulated ~15 feet to shower, stand pivot shower bench transfer using grab bar at shower bench (walk in shower) all with supervision and increased time to safely complete.  Pt completed donning/doffing shirt with setup-mod I, doffed/donned brief and pants with min assist, and required assist for donning/doffing socks however able to verbally describe good comprehension of sock aide and reacher use when at home for this task.  Pt dressed at EOB after shower then ambulated to sink using RW ~5 feet to brush teeth and groom hair with supervision in standing.  Stand pivot to recliner and sit with supervision and all needs in reach at end of session.    Therapy Documentation Precautions:  Precautions Precautions: Fall Precaution Comments: h/o R foot drop; uses Bioness for all walking at home Restrictions Weight Bearing Restrictions: Yes RLE Weight Bearing: Partial weight bearing RLE Partial Weight Bearing Percentage or Pounds: 50    Therapy/Group: Individual Therapy  Amie Critchley 06/29/2022, 8:57 AM

## 2022-06-29 NOTE — Progress Notes (Signed)
Physical Therapy Session Note  Patient Details  Name: Kayla [name] MRN: [id number] Date of Birth: [birth year]  Today's Date: 06/29/2022 PT Individual Time: 1300-1415 PT Individual Time Calculation (min): 75 min   Short Term Goals: Week 1:  PT Short Term Goal 1 (Week 1): Pt will perform functional transfers with CGA PT Short Term Goal 2 (Week 1): Pt will be able to inititate stair negotiation with mod assist PT Short Term Goal 3 (Week 1): Pt will be able to perform gait x 50' with CGA  Skilled Therapeutic Interventions/Progress Updates:     Pt seated in recliner upon arrival. Pt agreeable to therapy. Pt reports 2/10 pain in R hip, reports it is the best it has been today. Pt donned bioness with min A from therapist. Pt performed sit <>stand with RW and CGA for stability. Pt performed stand pivot transfer with RW and supervision.   Pt ambulated 52 feet with RW and supervision with decreased velocity, and step to gait pattern, with increased weight through UE to maintain 50% PWB on R LE, verbal cuing provided to reduce L LE knee hyperextension during gait. Pt complains of being hot, provided ice water. Therpist offered cold compress but pt refused. Resorted to seated therex and development of HEP.   Pt performed 3x10 LAQ to pt's range R LE limited > L LE, heel/toe raises, supine hip abduction.   Pt performed 2x5 sit<>stand with supervision, verbal cues provided for scooting edge of chair, forward trunk lean and pushing with one UE from mat table while other UE stabilizes RW.   Pt performed stand pivot transfer mat table to [facility] with supervision and RW.  Pt transported dependent in [facility] to room. Upon entering pt vomitting. Therapist provided emesis bag, cool cloth to forehead and back of neck. Notfiied nursing. Pt performed stand pivot transfer WC to bed with RW and CGA for stabilization. Pt performed sitting EOB to supine with min A for LE. Pt supine in bed with HOB elevated, bed alarm on and all  needs within reach at end of session.   Therapy Documentation Precautions:  Precautions Precautions: Fall Precaution Comments: h/o R foot drop; uses Bioness for all walking at home Restrictions Weight Bearing Restrictions: Yes RLE Weight Bearing: Partial weight bearing RLE Partial Weight Bearing Percentage or Pounds: 50    Therapy/Group: Individual Therapy  [facility] [place], [place], DPT  06/29/2022, 1:30 PM

## 2022-06-29 NOTE — Plan of Care (Signed)
  Problem: RH Balance Goal: LTG Patient will maintain dynamic standing with ADLs (OT) Description: LTG:  Patient will maintain dynamic standing balance with assist during activities of daily living (OT)  Outcome: Completed/Met   Problem: Sit to Stand Goal: LTG:  Patient will perform sit to stand in prep for activites of daily living with assistance level (OT) Description: LTG:  Patient will perform sit to stand in prep for activites of daily living with assistance level (OT) Outcome: Completed/Met   Problem: RH Dressing Goal: LTG Patient will perform lower body dressing w/assist (OT) Description: LTG: Patient will perform lower body dressing with assist, with/without cues in positioning using equipment (OT) Outcome: Completed/Met   Problem: RH Toilet Transfers Goal: LTG Patient will perform toilet transfers w/assist (OT) Description: LTG: Patient will perform toilet transfers with assist, with/without cues using equipment (OT) Outcome: Completed/Met   Problem: RH Tub/Shower Transfers Goal: LTG Patient will perform tub/shower transfers w/assist (OT) Description: LTG: Patient will perform tub/shower transfers with assist, with/without cues using equipment (OT) Outcome: Completed/Met

## 2022-06-29 NOTE — Progress Notes (Signed)
PROGRESS NOTE   Subjective/Complaints:  Pt reports constipation is worse than normal and feels really constipated.  Breathing feels the same- no different with ABX- and feels "fine".    ROS:   Pt denies SOB, abd pain, CP, N/V/ (+)C/D, and vision changes  Except for HPI    Objective:   DG Chest 2 View  Result Date: 06/27/2022 CLINICAL DATA:  Cough. EXAM: CHEST - 2 VIEW COMPARISON:  06/19/2022 and older FINDINGS: Underinflation. Left basilar opacities slightly improved with some residual. There is some increasing right basilar opacity but worsening inflation today. Subtle right midlung opacity as well. No pneumothorax or effusion. No edema. Stable cardiopericardial silhouette. Lordotic x-ray. IMPRESSION: Worsening inflation with increasing right mid right lung base opacity. Decreasing left lung base opacity with some residual. Recommend continued follow-up Electronically Signed   By: Karen Kays M.D.   On: 06/27/2022 17:21   No results for input(s): "WBC", "HGB", "HCT", "PLT" in the last 72 hours.  No results for input(s): "NA", "K", "CL", "CO2", "GLUCOSE", "BUN", "CREATININE", "CALCIUM" in the last 72 hours.   Intake/Output Summary (Last 24 hours) at 06/29/2022 1312 Last data filed at 06/29/2022 0748 Gross per 24 hour  Intake 240 ml  Output 900 ml  Net -660 ml        Physical Exam: Vital Signs Blood pressure (!) 105/55, pulse 63, temperature 98.5 F (36.9 C), resp. rate 18, height 5\' 3"  (1.6 m), weight 50.5 kg, SpO2 98 %.         General: awake, alert, appropriate, supine in bed; NAD HENT: conjugate gaze; oropharynx moist- vocal cord dysfunction sounds better today CV: regular rate and rhythm; no JVD Pulmonary: CTA B/L; no W/R/R- good air movement- slightly decreased R lung base GI: soft, NT, appears distended; hypoactive BS Psychiatric: appropriate Neurological: Ox3  Some spasticity  Assessment/Plan: 1.  Functional deficits which require 3+ hours per day of interdisciplinary therapy in a comprehensive inpatient rehab setting. Physiatrist is providing close team supervision and 24 hour management of active medical problems listed below. Physiatrist and rehab team continue to assess barriers to discharge/monitor patient progress toward functional and medical goals  Care Tool:  Bathing    Body parts bathed by patient: Right arm, Left arm, Chest, Abdomen, Front perineal area, Buttocks, Right upper leg, Left upper leg, Face   Body parts bathed by helper: Right arm, Left arm, Chest, Abdomen, Right upper leg, Left upper leg, Front perineal area Body parts n/a: Buttocks, Right lower leg, Left lower leg   Bathing assist Assist Level: Supervision/Verbal cueing     Upper Body Dressing/Undressing Upper body dressing   What is the patient wearing?: Pull over shirt    Upper body assist Assist Level: Set up assist    Lower Body Dressing/Undressing Lower body dressing      What is the patient wearing?: Underwear/pull up, Pants     Lower body assist Assist for lower body dressing: Minimal Assistance - Patient > 75%     Toileting Toileting    Toileting assist Assist for toileting: Supervision/Verbal cueing     Transfers Chair/bed transfer  Transfers assist     Chair/bed transfer assist level: Minimal Assistance -  Patient > 75%     Locomotion Ambulation   Ambulation assist      Assist level: Minimal Assistance - Patient > 75% Assistive device: Walker-rolling Max distance: 15'   Walk 10 feet activity   Assist     Assist level: Minimal Assistance - Patient > 75% Assistive device: Walker-rolling   Walk 50 feet activity   Assist Walk 50 feet with 2 turns activity did not occur: Safety/medical concerns         Walk 150 feet activity   Assist Walk 150 feet activity did not occur: Safety/medical concerns         Walk 10 feet on uneven surface   activity   Assist Walk 10 feet on uneven surfaces activity did not occur: Safety/medical concerns         Wheelchair     Assist Is the patient using a wheelchair?: Yes Type of Wheelchair: Manual    Wheelchair assist level: Supervision/Verbal cueing Max wheelchair distance: 100'    Wheelchair 50 feet with 2 turns activity    Assist        Assist Level: Supervision/Verbal cueing   Wheelchair 150 feet activity     Assist      Assist Level: Moderate Assistance - Patient 50 - 74%   Blood pressure (!) 105/55, pulse 63, temperature 98.5 F (36.9 C), resp. rate 18, height 5\' 3"  (1.6 m), weight 50.5 kg, SpO2 98 %.  Medical Problem List and Plan: 1. Functional deficits secondary to inferior pubic rami fracture and nondisplaced right anterior and posterior acetabular fractures.             -patient may shower             -ELOS/Goals: 8-12 days modI             d/c 4/9  Con't CIR PT and OT- needs staples out in head- wrote order 2.  Antithrombotics: -DVT/anticoagulation:  Mechanical: Sequential compression devices, below knee Bilateral lower extremities and Lovenox 30meq daily.  3. Pain from multiple fractures:  4/1- pt was getting IV fentanyl- will change to PO Dilaudid 2 mg q4 hours prn- and change IV Toradol to 10 mg PO q8 hours prn- for next 4 days.   4/2- d/w pt- Dilaudid somewhat helpful, but will tell me after therapy today if needs to increase dose- d/w nursing as well- could increase to 4 mg   4/3- Will increase dilaudid to 4 mg q4 hours prn- and d/c Lidoderm- it's not helpful  4/5- having sharp, stabbing pain in hip- from fracture- will schedule Robaxin 500 mg QID and add Diclofenac 75 mg BID since Toradol stopped- appropriate stopping-   4/6- pain a little better today 4. Insomnia: continue temazepam.  5. Neuropsych/cognition: This patient is capable of making decisions on her own behalf. 6. Iron deficiency anemia: continue daily iron supplement 7.  Hypothyroidism: continue Synthroid 8. MS with chronic vocal cord paralyzed- has speech changes as a result: continue home baclofen.  9. GERD: continue PPI and H2 blocker 10. Depression: continue daily prozac.   11. Chronic constipation  4/1- usually goes q ~1 week- has been longer than that and taking Pain meds- wil give Sorbitol after therapy today- no results with miralax or Colace. 4/2- no results with Sorbitol- will give Enema soap suds after therapy today at 3pm- hopefully will get good results.  4/3- had small BM with enema- but wants ot wait to do more intervention for 1 day  4/4- will  give Sorbitol and Soap suds enema today after therapy 4/5- will get KUB and intervene as needed- KUB shows formed stool in transverse and proximal colon- will d/w PA and determine what the next avenue is  4/6- will try another dose of Sorbitol but 60cc- and Enema- if doesn't work, wrote nursing to let me know and will give Mg citrate 4/7- spoke with nursing- didn't get golytely yesterday had small BM with Sorbitol and SSE- will give Golytely until multiple BM's today 12. IV- will d/c 4/2- if goes ok switching to PO meds-   4/2- tolerating PO meds- will stop IV 13. Hyponatremia  4/1- Na last checked 2 days ago was 130- will recheck in AM CBC and BMP.   4/2- Na still 130- will con't to monitor weekly 13. Urinary incontinence  4/5- will decrease Myrbetriq to QOD- since having some more urinary retention than did- has been on Myrbetriq for 6 weeks has tried Oxybutynin without good success in past.  14. Pneumonia- R middle lobe  4/6- spoke with pharmacy- they agreed with my Zithromax- will do 500 mg daily x 3 days- wait to start Prednisone since had difficulty with Methylprednisone in past- will d/w pt again- and add tessalon pearles for cough 100 mg TID scheduled.   4/7- coughing ok- pt reports breathing is fine, but is no different than it had been prior to ABX starting yesterday  I spent a total of 36    minutes on total care today- >50% coordination of care- due to  D/w nursing about bowels and needing to get them going- d/w nursing that need to keep giving Golytely until pt had multiple BM's   LOS: 7 days A FACE TO FACE EVALUATION WAS PERFORMED  Farheen Pfahler 06/29/2022, 1:12 PM

## 2022-06-30 DIAGNOSIS — K5900 Constipation, unspecified: Secondary | ICD-10-CM

## 2022-06-30 DIAGNOSIS — E871 Hypo-osmolality and hyponatremia: Secondary | ICD-10-CM

## 2022-06-30 DIAGNOSIS — D509 Iron deficiency anemia, unspecified: Secondary | ICD-10-CM

## 2022-06-30 DIAGNOSIS — R102 Pelvic and perineal pain: Secondary | ICD-10-CM

## 2022-06-30 NOTE — Plan of Care (Signed)
  Problem: RH Bathing Goal: LTG Patient will bathe all body parts with assist levels (OT) Description: LTG: Patient will bathe all body parts with assist levels (OT) Outcome: Completed/Met   Problem: RH Toileting Goal: LTG Patient will perform toileting task (3/3 steps) with assistance level (OT) Description: LTG: Patient will perform toileting task (3/3 steps) with assistance level (OT)  Outcome: Completed/Met

## 2022-06-30 NOTE — Progress Notes (Signed)
Physical Therapy Discharge Summary  Patient Details  Name: MISCHELE HAIRGROVE MRN: [id number] Date of Birth: Jan 08, 1951  Date of Discharge from PT service:June 30, 2022  Today's Date: 06/30/2022 PT Individual Time: 4174-0814, 1430-1520 PT Individual Time Calculation (min): 69 min, 50  min    Patient has met 9 of 9 long term goals due to improved activity tolerance, improved balance, increased strength, increased range of motion, decreased pain, and ability to compensate for deficits.  Patient to discharge at an ambulatory level Supervision.   Patient's care partner is independent to provide the necessary physical assistance at discharge.  Reasons goals not met: n/a, however provided min A with sit<>stand this date for energy conservation.  Recommendation:  Patient will benefit from ongoing skilled PT services in outpatient setting to continue to advance safe functional mobility, address ongoing impairments in strength, ROM, gait mechanics, and minimize fall risk.  Equipment: No equipment provided  Reasons for discharge: treatment goals met and discharge from hospital  Patient/family agrees with progress made and goals achieved: Yes  Skilled Therapeutic Interventions/Progress Updates: Session 1: pt received in bed and agreeable to therapy. Pt reports 4/10 pain, stable with activity and premedicated. Rest and positioning provided as needed. Session focused on d/c assessment as documented below. Supine>sit mod I with bed rail, which pt has purchased for home. Pt performed Sit to stand with supervision and RW x 2, then min a for rest of session for energy conservation and MS management. ambulatory transfer with RW and supervision, Pt transported to therapy gym for time management and energy conservation. Pt then navigated 6" steps x 12 with shower chair method. Pt attempted x 1 without chair but was unable to support weight on rail and lift LE d/t rail placement. Therapist assisted with moving  chair and slowing descent for pain management. Pt then performed car transfer with light min A for moving RLE over car threshold of simulator which she is unlikely to need in real car. Pt then navigated curb step with backwards technique and RW, CGA. Pt remained in w/c with all needs in reach.   Session 2: pt received in bed and agreeable to therapy. Pt reporting fatigue  this session, so focused on pt and family education. Answered questions about equipment and follow up. Continued education on supervision assist level. Provided hep for general exercise and went over with pt, including frequency and appropriate loading. Discussed maintaining weight bearing precautions with RW. Pt remained in bed at end of session and was left with all needs in reach and alarm active.   PT Discharge Precautions/Restrictions Precautions Precautions: Fall Precaution Comments: h/o R foot drop; uses Bioness for all walking at home Restrictions Weight Bearing Restrictions: No RLE Weight Bearing: Partial weight bearing RLE Partial Weight Bearing Percentage or Pounds: 50, pt able to recall independently Vital Signs  Pain Pain Assessment Pain Scale: 0-10 Pain Score: 4  Pain Type: Chronic pain Pain Location: Hip Pain Orientation: Right Pain Descriptors / Indicators: Aching Pain Onset: On-going Patients Stated Pain Goal: 2 Pain Intervention(s): Pain med given for lower pain score than stated, per patient request;Rest;Hot/Cold interventions;Repositioned;Shower Multiple Pain Sites: No Pain Interference Pain Interference Pain Effect on Sleep: 2. Occasionally Pain Interference with Therapy Activities: 1. Rarely or not at all Pain Interference with Day-to-Day Activities: 1. Rarely or not at all Vision/Perception  Vision - History Ability to See in Adequate Light: 0 Adequate Perception Perception: Within Functional Limits Praxis Praxis: Intact  Cognition Overall Cognitive Status: Within Functional Limits for  tasks assessed Arousal/Alertness: Awake/alert Orientation Level: Oriented X4 Memory: Appears intact Awareness: Appears intact Problem Solving: Appears intact Safety/Judgment: Appears intact Sensation Sensation Light Touch: Appears Intact Hot/Cold: Appears Intact Proprioception: Appears Intact Coordination Gross Motor Movements are Fluid and Coordinated: Yes Fine Motor Movements are Fluid and Coordinated: Yes Coordination and Movement Description: limited by pain only Motor  Motor Motor: Abnormal postural alignment and control;Other (comment) Motor - Skilled Clinical Observations: pain limiting, stiff movements overall, decreased coordination Motor - Discharge Observations: limtied by pain, but otherwise near/at baseline  Mobility Bed Mobility Bed Mobility: Rolling Right;Supine to Sit;Sit to Supine Rolling Right: Independent Supine to Sit: Independent Sit to Supine: Supervision/Verbal cueing Transfers Transfers: Sit to Stand;Stand to Sit;Stand Pivot Transfers Sit to Stand: Supervision/Verbal cueing Stand to Sit: Supervision/Verbal cueing Stand Pivot Transfers: Supervision/Verbal cueing Stand Pivot Transfer Details: Verbal cues for precautions/safety;Verbal cues for technique;Verbal cues for safe use of DME/AE;Manual facilitation for weight shifting Transfer (Assistive device): Rolling walker Locomotion     Trunk/Postural Assessment  Cervical Assessment Cervical Assessment: Within Functional Limits Thoracic Assessment Thoracic Assessment: Within Functional Limits Lumbar Assessment Lumbar Assessment: Within Functional Limits Postural Control Postural Control: Deficits on evaluation  Balance Balance Balance Assessed: Yes Static Sitting Balance Static Sitting - Level of Assistance: 6: Modified independent (Device/Increase time) Dynamic Sitting Balance Dynamic Sitting - Level of Assistance: 5: Stand by assistance Static Standing Balance Static Standing - Level of  Assistance: 5: Stand by assistance Dynamic Standing Balance Dynamic Standing - Level of Assistance: 5: Stand by assistance Extremity Assessment  RUE Assessment RUE Assessment: Within Functional Limits LUE Assessment LUE Assessment: Within Functional Limits RLE Assessment Active Range of Motion (AROM) Comments: hip flexion limited due to pain and weakness; maintains R foot internally rotated General Strength Comments: 3-/5 knee flexion, 4-/5 knee extension, 3/5 ankle (h/o of foot drop with use of Bioness at home with all walking) LLE Assessment LLE Assessment: Within Functional Limits General Strength Comments: grossly 4-/5   Shakyla Nolley C Alyx Gee 06/30/2022, 10:57 AM

## 2022-06-30 NOTE — Progress Notes (Signed)
PROGRESS NOTE   Subjective/Complaints:  She reports she was having severe pain last night however it improved with Dilaudid and she was able to get some sleep.  Pain is doing better this morning.  She continues to feel constipated, has a bottle of GoLytely by her bedside.  She says she only had 1 glass of this yesterday due to the pain.  She plans to try this today.   ROS:   Pt denies SOB, abd pain, CP, N/V/ (+)C/D, headache and vision changes  Except for HPI    Objective:   No results found. No results for input(s): "WBC", "HGB", "HCT", "PLT" in the last 72 hours.  No results for input(s): "NA", "K", "CL", "CO2", "GLUCOSE", "BUN", "CREATININE", "CALCIUM" in the last 72 hours.   Intake/Output Summary (Last 24 hours) at 06/30/2022 0827 Last data filed at 06/29/2022 2140 Gross per 24 hour  Intake --  Output 700 ml  Net -700 ml         Physical Exam: Vital Signs Blood pressure (!) 109/59, pulse 68, temperature 98.7 F (37.1 C), temperature source Oral, resp. rate 18, height 5\' 3"  (1.6 m), weight 50.5 kg, SpO2 94 %.         General: awake, alert, appropriate, supine in bed; NAD HENT: conjugate gaze; oropharynx moist- vocal cord dysfunction sounds better today CV: regular rate and rhythm; no JVD Pulmonary: CTA B/L; no W/R/R- good air movement GI: soft, NT, appears distended; hypoactive BS Psychiatric: appropriate Neurological: Ox3, follows commands, CN 2-12 grossly intact  Some spasticity  Assessment/Plan: 1. Functional deficits which require 3+ hours per day of interdisciplinary therapy in a comprehensive inpatient rehab setting. Physiatrist is providing close team supervision and 24 hour management of active medical problems listed below. Physiatrist and rehab team continue to assess barriers to discharge/monitor patient progress toward functional and medical goals  Care Tool:  Bathing    Body parts  bathed by patient: Right arm, Left arm, Chest, Abdomen, Front perineal area, Buttocks, Right upper leg, Left upper leg, Face   Body parts bathed by helper: Right arm, Left arm, Chest, Abdomen, Right upper leg, Left upper leg, Front perineal area Body parts n/a: Buttocks, Right lower leg, Left lower leg   Bathing assist Assist Level: Supervision/Verbal cueing     Upper Body Dressing/Undressing Upper body dressing   What is the patient wearing?: Pull over shirt    Upper body assist Assist Level: Set up assist    Lower Body Dressing/Undressing Lower body dressing      What is the patient wearing?: Underwear/pull up, Pants     Lower body assist Assist for lower body dressing: Minimal Assistance - Patient > 75%     Toileting Toileting    Toileting assist Assist for toileting: Supervision/Verbal cueing     Transfers Chair/bed transfer  Transfers assist     Chair/bed transfer assist level: Minimal Assistance - Patient > 75%     Locomotion Ambulation   Ambulation assist      Assist level: Minimal Assistance - Patient > 75% Assistive device: Walker-rolling Max distance: 15'   Walk 10 feet activity   Assist     Assist level: Minimal  Assistance - Patient > 75% Assistive device: Walker-rolling   Walk 50 feet activity   Assist Walk 50 feet with 2 turns activity did not occur: Safety/medical concerns         Walk 150 feet activity   Assist Walk 150 feet activity did not occur: Safety/medical concerns         Walk 10 feet on uneven surface  activity   Assist Walk 10 feet on uneven surfaces activity did not occur: Safety/medical concerns         Wheelchair     Assist Is the patient using a wheelchair?: Yes Type of Wheelchair: Manual    Wheelchair assist level: Supervision/Verbal cueing Max wheelchair distance: 100'    Wheelchair 50 feet with 2 turns activity    Assist        Assist Level: Supervision/Verbal cueing    Wheelchair 150 feet activity     Assist      Assist Level: Moderate Assistance - Patient 50 - 74%   Blood pressure (!) 109/59, pulse 68, temperature 98.7 F (37.1 C), temperature source Oral, resp. rate 18, height 5\' 3"  (1.6 m), weight 50.5 kg, SpO2 94 %.  Medical Problem List and Plan: 1. Functional deficits secondary to inferior pubic rami fracture and nondisplaced right anterior and posterior acetabular fractures.             -patient may shower             -ELOS/Goals: 8-12 days modI             d/c 4/9  Con't CIR PT and OT- needs staples out in head- wrote order- have been removed 2.  Antithrombotics: -DVT/anticoagulation:  Mechanical: Sequential compression devices, below knee Bilateral lower extremities and Lovenox daily.  3. Pain from multiple fractures:  4/1- pt was getting IV fentanyl- will change to PO Dilaudid 2 mg q4 hours prn- and change IV Toradol to 10 mg PO q8 hours prn- for next 4 days.   4/2- d/w pt- Dilaudid somewhat helpful, but will tell me after therapy today if needs to increase dose- d/w nursing as well- could increase to 4 mg   4/3- Will increase dilaudid to 4 mg q4 hours prn- and d/c Lidoderm- it's not helpful  4/5- having sharp, stabbing pain in hip- from fracture- will schedule Robaxin 500 mg QID and add Diclofenac 75 mg BID since Toradol stopped- appropriate stopping-   4/6- pain a little better today  4/6 Pain overall controlled today, continue current regimen 4. Insomnia: continue temazepam.  5. Neuropsych/cognition: This patient is capable of making decisions on her own behalf. 6. Iron deficiency anemia: continue daily iron supplement  -4/8 Hgb stable at 11.1 7. Hypothyroidism: continue Synthroid 8. MS with chronic vocal cord paralyzed- has speech changes as a result: continue home baclofen.  9. GERD: continue PPI and H2 blocker 10. Depression: continue daily prozac.   11. Chronic constipation  4/1- usually goes q ~1 week- has been  longer than that and taking Pain meds- wil give Sorbitol after therapy today- no results with miralax or Colace. 4/2- no results with Sorbitol- will give Enema soap suds after therapy today at 3pm- hopefully will get good results.  4/3- had small BM with enema- but wants ot wait to do more intervention for 1 day  4/4- will give Sorbitol and Soap suds enema today after therapy 4/5- will get KUB and intervene as needed- KUB shows formed stool in transverse and proximal colon- will  d/w PA and determine what the next avenue is  4/6- will try another dose of Sorbitol but 60cc- and Enema- if doesn't work, wrote nursing to let me know and will give Mg citrate 4/7- spoke with nursing- didn't get golytely yesterday had small BM with Sorbitol and SSE- will give Golytely until multiple BM's today  4/8 She didn't complete GoLytely last night, advised her to complete this this morning and she agrees 12. IV- will d/c 4/2- if goes ok switching to PO meds-   4/2- tolerating PO meds- will stop IV 13. Hyponatremia  4/1- Na last checked 2 days ago was 130- will recheck in AM CBC and BMP.   4/2- Na still 130- will con't to monitor weekly  4/8 Na stable at 130 today 13. Urinary incontinence  4/5- will decrease Myrbetriq to QOD- since having some more urinary retention than did- has been on Myrbetriq for 6 weeks has tried Oxybutynin without good success in past.  14. Pneumonia- R middle lobe  4/6- spoke with pharmacy- they agreed with my Zithromax- will do 500 mg daily x 3 days- wait to start Prednisone since had difficulty with Methylprednisone in past- will d/w pt again- and add tessalon pearles for cough 100 mg TID scheduled.   4/7- coughing ok- pt reports breathing is fine, but is no different than it had been prior to ABX starting yesterday  LOS: 8 days A FACE TO FACE EVALUATION WAS PERFORMED  Fanny DanceYuri Chayse Zatarain 06/30/2022, 8:27 AM

## 2022-06-30 NOTE — Progress Notes (Signed)
Occupational Therapy Discharge Summary  Patient Details  Name: Kayla [name] MRN: [id number] Date of Birth: [birth year]  Date of Discharge from OT service:June 30, 2022  Today's Date: 06/30/2022 OT Individual Time: 7035-0093 OT Individual Time Calculation (min): 74 min    Patient has met 6 of 6 long term goals due to improved activity tolerance, improved balance, postural control, ability to compensate for deficits, functional use of  RIGHT lower and LEFT lower extremity, and improved coordination.  Patient to discharge at overall Supervision level.  Patient's care partner is independent to provide the necessary physical assistance at discharge.    Reasons goals not met: n/a   Recommendation:  Patient will benefit from ongoing skilled OT services in outpatient setting to continue to advance functional skills in the area of BADL, iADL, and Reduce care partner burden.  Equipment: No equipment provided except LH sponge, has all other DME and AE  Reasons for discharge: treatment goals met  Patient/family agrees with progress made and goals achieved: Yes  OT Discharge Precautions/Restrictions  Precautions Precautions: Fall Restrictions Weight Bearing Restrictions: No RLE Weight Bearing: Partial weight bearing RLE Partial Weight Bearing Percentage or Pounds: 50   Pain Pain Assessment Pain Scale: 0-10 Pain Score: 4  Pain Type: Chronic pain Pain Location: Hip Pain Orientation: Right Pain Descriptors / Indicators: Aching Pain Onset: On-going Patients Stated Pain Goal: 2 Pain Intervention(s): Pain med given for lower pain score than stated, per patient request;Rest;Hot/Cold interventions;Repositioned;Shower Multiple Pain Sites: No ADL ADL Equipment Provided: Reacher, Long-handled sponge Eating: Independent Grooming: Independent Upper Body Bathing: Setup Where Assessed-Upper Body Bathing: Shower Lower Body Bathing: Supervision/safety Where Assessed-Lower Body Bathing:  Shower Upper Body Dressing: Setup Where Assessed-Upper Body Dressing: Wheelchair Lower Body Dressing: Supervision/safety Where Assessed-Lower Body Dressing: Wheelchair, Sitting at sink, Standing at sink Toileting: Supervision/safety Where Assessed-Toileting: Bedside Commode, Teacher, adult education: Close supervision Toilet Transfer Method: Surveyor, minerals: Raised toilet seat, Bedside commode Film/video editor: Administrator, arts Method: Warden/ranger: Emergency planning/management officer, Grab bars ADL Comments: mod I for UB self care, S for LB selfcare with AE, CGA/close S for bathroom transfers Vision Baseline Vision/History: 1 Wears glasses Patient Visual Report: No change from baseline Vision Assessment?: No apparent visual deficits Perception  Perception: Within Functional Limits Praxis Praxis: Intact Cognition Cognition Overall Cognitive Status: Within Functional Limits for tasks assessed Arousal/Alertness: Awake/alert Orientation Level: Person;Place;Situation Memory: Appears intact Awareness: Appears intact Problem Solving: Appears intact Safety/Judgment: Appears intact Brief Interview for Mental Status (BIMS) Repetition of Three Words (First Attempt): 3 Temporal Orientation: Year: Correct Temporal Orientation: Month: Accurate within 5 days Temporal Orientation: Day: Correct Recall: "Sock": Yes, no cue required Recall: "Blue": Yes, no cue required Recall: "Bed": Yes, no cue required BIMS Summary Score: 15 Sensation Sensation Light Touch: Appears Intact Hot/Cold: Appears Intact Proprioception: Appears Intact Coordination Gross Motor Movements are Fluid and Coordinated: Yes Fine Motor Movements are Fluid and Coordinated: Yes Motor  Motor Motor: Abnormal postural alignment and control;Other (comment) Mobility  Bed Mobility Bed Mobility: Rolling Right;Supine to Sit;Sit to Supine Rolling Right:  Independent Supine to Sit: Independent Sit to Supine: Supervision/Verbal cueing Transfers Sit to Stand: Supervision/Verbal cueing Stand to Sit: Supervision/Verbal cueing  Trunk/Postural Assessment  Cervical Assessment Cervical Assessment: Within Functional Limits Thoracic Assessment Thoracic Assessment: Within Functional Limits Lumbar Assessment Lumbar Assessment: Within Functional Limits Postural Control Postural Control: Deficits on evaluation  Balance Balance Balance Assessed: Yes Static Sitting Balance Static Sitting - Level of Assistance:  6: Modified independent (Device/Increase time) Dynamic Sitting Balance Dynamic Sitting - Level of Assistance: 5: Stand by assistance Static Standing Balance Static Standing - Level of Assistance: 5: Stand by assistance Dynamic Standing Balance Dynamic Standing - Level of Assistance: 5: Stand by assistance Extremity/Trunk Assessment RUE Assessment RUE Assessment: Within Functional Limits LUE Assessment LUE Assessment: Within Functional Limits  OT Interventions and trng:  Pt seen for skilled OT discharge visit. Pt now has LH sponge and was able to utilize as well as sock aide and reacher to complete full am shower and dressing routine this session. Amb with RW and standing tolerance safety trng completed. See status for d/c levels. Pain as indicated dropped from 4/20 to 2/10 with warm shower, repositioning and rest. Pt feels ready for d/c and OT in agreement. Left pt bed level with kpad to rest before PT, with bed exit engaged, needs and nurse call button in reach.   [name] 06/30/2022, 10:12 AM

## 2022-06-30 NOTE — Progress Notes (Signed)
Closed nursing education and nursing care plan for discharge.  

## 2022-06-30 NOTE — Progress Notes (Signed)
Patient ID: Kayla [name], female   DOB: [birth year], 72 y.o.   MRN: [id number]  SW called pt husband to inform referral for PT/OT will be faxed to [facility] and their physician will have too approve if able to accept. SW shared will follow-up once there is an answer.   [facility] (p:[phone]/[phone]) offers PT/OT. Requires a referral and approval from physician at clinic.  *SW confirmed with [facility] referral was received. SW requested follow-up once the physician has reviewed and determine if able to accept.  Cecile Sheerer, MSW, LCSWA Office: [phone] Cell: [phone] Fax: [phone]

## 2022-07-01 ENCOUNTER — Other Ambulatory Visit: Payer: Self-pay | Admitting: Internal Medicine

## 2022-07-01 ENCOUNTER — Other Ambulatory Visit (HOSPITAL_COMMUNITY): Payer: Self-pay

## 2022-07-01 DIAGNOSIS — S32591A Other specified fracture of right pubis, initial encounter for closed fracture: Secondary | ICD-10-CM

## 2022-07-01 DIAGNOSIS — R5382 Chronic fatigue, unspecified: Secondary | ICD-10-CM

## 2022-07-01 DIAGNOSIS — S32592A Other specified fracture of left pubis, initial encounter for closed fracture: Secondary | ICD-10-CM

## 2022-07-01 DIAGNOSIS — G35 Multiple sclerosis: Secondary | ICD-10-CM

## 2022-07-01 MED ORDER — HYDROMORPHONE HCL 4 MG PO TABS
4.0000 mg | ORAL_TABLET | ORAL | 0 refills | Status: DC | PRN
  Filled 2022-07-01: qty 20, 4d supply, fill #0

## 2022-07-01 MED ORDER — CAPSAICIN 0.025 % EX CREA: TOPICAL_CREAM | Freq: Four times a day (QID) | CUTANEOUS | 0 refills | Status: DC | PRN

## 2022-07-01 MED ORDER — MIRABEGRON ER 25 MG PO TB24
25.0000 mg | ORAL_TABLET | ORAL | 0 refills | Status: DC
  Filled 2022-07-01: qty 15, 30d supply, fill #0

## 2022-07-01 MED ORDER — METHOCARBAMOL 500 MG PO TABS
500.0000 mg | ORAL_TABLET | Freq: Four times a day (QID) | ORAL | 0 refills | Status: DC
  Filled 2022-07-01: qty 120, 30d supply, fill #0

## 2022-07-01 NOTE — Progress Notes (Signed)
PROGRESS NOTE   Subjective/Complaints:  Pt reports bowels are working- doesn't want more- "moving enough".   Ready for d/c today.    ROS:   Pt denies SOB, abd pain, CP, N/V/C/D, and vision changes   Except for HPI    Objective:   No results found. No results for input(s): "WBC", "HGB", "HCT", "PLT" in the last 72 hours.  No results for input(s): "NA", "K", "CL", "CO2", "GLUCOSE", "BUN", "CREATININE", "CALCIUM" in the last 72 hours.   Intake/Output Summary (Last 24 hours) at 07/01/2022 0802 Last data filed at 07/01/2022 0626 Gross per 24 hour  Intake 1640 ml  Output 1750 ml  Net -110 ml        Physical Exam: Vital Signs Blood pressure (!) 118/57, pulse 71, temperature 98.2 F (36.8 C), temperature source Oral, resp. rate 16, height 5\' 3"  (1.6 m), weight 50.5 kg, SpO2 94 %.          General: awake, alert, appropriate, sitting on BSC having BM; NAD HENT: conjugate gaze; oropharynx moist- vocal cord dysfunction sounding less strenuous to talk;  CV: regular rate; no JVD Pulmonary: CTA B/L; no W/R/R- good air movement GI: soft, NT, ND, (+)BS Psychiatric: appropriate Neurological: Ox3, follows commands, CN 2-12 grossly intact  Some spasticity  Assessment/Plan: 1. Functional deficits which require 3+ hours per day of interdisciplinary therapy in a comprehensive inpatient rehab setting. Physiatrist is providing close team supervision and 24 hour management of active medical problems listed below. Physiatrist and rehab team continue to assess barriers to discharge/monitor patient progress toward functional and medical goals  Care Tool:  Bathing    Body parts bathed by patient: Right arm, Left arm, Chest, Abdomen, Front perineal area, Buttocks, Right upper leg, Left upper leg, Face   Body parts bathed by helper: Right arm, Left arm, Chest, Abdomen, Right upper leg, Left upper leg, Front perineal area,  Buttocks, Right lower leg, Left lower leg, Face Body parts n/a: Buttocks, Right lower leg, Left lower leg   Bathing assist Assist Level: Supervision/Verbal cueing     Upper Body Dressing/Undressing Upper body dressing   What is the patient wearing?: Pull over shirt    Upper body assist Assist Level: Independent    Lower Body Dressing/Undressing Lower body dressing      What is the patient wearing?: Underwear/pull up, Pants     Lower body assist Assist for lower body dressing: Supervision/Verbal cueing     Toileting Toileting    Toileting assist Assist for toileting: Supervision/Verbal cueing     Transfers Chair/bed transfer  Transfers assist     Chair/bed transfer assist level: Supervision/Verbal cueing     Locomotion Ambulation   Ambulation assist      Assist level: Supervision/Verbal cueing Assistive device: Walker-rolling Max distance: 150   Walk 10 feet activity   Assist     Assist level: Supervision/Verbal cueing Assistive device: Walker-rolling   Walk 50 feet activity   Assist Walk 50 feet with 2 turns activity did not occur: Safety/medical concerns  Assist level: Supervision/Verbal cueing Assistive device: Walker-rolling    Walk 150 feet activity   Assist Walk 150 feet activity did not occur: Safety/medical concerns  Assist level: Supervision/Verbal cueing Assistive device: Walker-rolling    Walk 10 feet on uneven surface  activity   Assist Walk 10 feet on uneven surfaces activity did not occur: Safety/medical concerns   Assist level: Supervision/Verbal cueing Assistive device: Walker-rolling   Wheelchair     Assist Is the patient using a wheelchair?: Yes Type of Wheelchair: Manual    Wheelchair assist level: Supervision/Verbal cueing Max wheelchair distance: 16450ft    Wheelchair 50 feet with 2 turns activity    Assist        Assist Level: Supervision/Verbal cueing   Wheelchair 150 feet activity      Assist      Assist Level: Supervision/Verbal cueing   Blood pressure (!) 118/57, pulse 71, temperature 98.2 F (36.8 C), temperature source Oral, resp. rate 16, height 5\' 3"  (1.6 m), weight 50.5 kg, SpO2 94 %.  Medical Problem List and Plan: 1. Functional deficits secondary to inferior pubic rami fracture and nondisplaced right anterior and posterior acetabular fractures.             -patient may shower             -ELOS/Goals: 8-12 days modI             d/c 4/9  D/c today- will need f/u with Dr Berline ChoughLovorn for pain meds and MS.  2.  Antithrombotics: -DVT/anticoagulation:  Mechanical: Sequential compression devices, below knee Bilateral lower extremities and Lovenox 30meq daily.  3. Pain from multiple fractures:  4/1- pt was getting IV fentanyl- will change to PO Dilaudid 2 mg q4 hours prn- and change IV Toradol to 10 mg PO q8 hours prn- for next 4 days.   4/2- d/w pt- Dilaudid somewhat helpful, but will tell me after therapy today if needs to increase dose- d/w nursing as well- could increase to 4 mg   4/3- Will increase dilaudid to 4 mg q4 hours prn- and d/c Lidoderm- it's not helpful  4/5- having sharp, stabbing pain in hip- from fracture- will schedule Robaxin 500 mg QID and add Diclofenac 75 mg BID since Toradol stopped- appropriate stopping-   4/6- pain a little better today  4/6 Pain overall controlled today, continue current regimen  4/8- will need 1 week supply of pain meds, then to call my clinic to get refills  for 1 month 4. Insomnia: continue temazepam.  5. Neuropsych/cognition: This patient is capable of making decisions on her own behalf. 6. Iron deficiency anemia: continue daily iron supplement  -4/8 Hgb stable at 11.1 7. Hypothyroidism: continue Synthroid 8. MS with chronic vocal cord paralyzed- has speech changes as a result: continue home baclofen.  9. GERD: continue PPI and H2 blocker 10. Depression: continue daily prozac.   11. Chronic constipation  4/1-  usually goes q ~1 week- has been longer than that and taking Pain meds- wil give Sorbitol after therapy today- no results with miralax or Colace. 4/2- no results with Sorbitol- will give Enema soap suds after therapy today at 3pm- hopefully will get good results.  4/3- had small BM with enema- but wants ot wait to do more intervention for 1 day  4/4- will give Sorbitol and Soap suds enema today after therapy 4/5- will get KUB and intervene as needed- KUB shows formed stool in transverse and proximal colon- will d/w PA and determine what the next avenue is  4/6- will try another dose of Sorbitol but 60cc- and Enema- if doesn't work, wrote nursing to let me know and will  give Mg citrate 4/7- spoke with nursing- didn't get golytely yesterday had small BM with Sorbitol and SSE- will give Golytely until multiple BM's today  4/8 She didn't complete GoLytely last night, advised her to complete this this morning and she agrees 4/9- pt reports bowels finally going- doesn't want more meds 12. IV- will d/c 4/2- if goes ok switching to PO meds-   4/2- tolerating PO meds- will stop IV 13. Hyponatremia  4/1- Na last checked 2 days ago was 130- will recheck in AM CBC and BMP.   4/2- Na still 130- will con't to monitor weekly  4/8 Na stable at 130 today 13. Urinary incontinence  4/5- will decrease Myrbetriq to QOD- since having some more urinary retention than did- has been on Myrbetriq for 6 weeks has tried Oxybutynin without good success in past.  14. Pneumonia- R middle lobe  4/6- spoke with pharmacy- they agreed with my Zithromax- will do 500 mg daily x 3 days- wait to start Prednisone since had difficulty with Methylprednisone in past- will d/w pt again- and add tessalon pearles for cough 100 mg TID scheduled.   4/7- coughing ok- pt reports breathing is fine, but is no different than it had been prior to ABX starting yesterday  4/9- breathing well- sounds better    LOS: 9 days A FACE TO FACE  EVALUATION WAS PERFORMED  Maurene Hollin 07/01/2022, 8:02 AM

## 2022-07-01 NOTE — Progress Notes (Signed)
Inpatient Rehabilitation Discharge Medication Review by a Pharmacist  A complete drug regimen review was completed for this patient to identify any potential clinically significant medication issues.  High Risk Drug Classes Is patient taking? Indication by Medication  Antipsychotic No   Anticoagulant No   Antibiotic Yes Methenamine, Trimethoprim-Prevention of UTI  Opioid Yes Hydromorphone-pain  Antiplatelet No   Hypoglycemics/insulin No   Vasoactive Medication Yes Tamsulosin-urinary retention  Chemotherapy No   Other Yes Capscacin-pain Methocarbamol-spasms Mirabegron-urinary retention Albuterol, Trelegy-COPD Famotidine, dexlansoprazole-GERD Accrufer-iron supplementation Adderall, fluoxetine-psychiatirc condition Docusate-constipation Levothyroxine-hypothyroidism Temazepam-insomnia Xembify-MS      Type of Medication Issue Identified Description of Issue Recommendation(s)  Drug Interaction(s) (clinically significant)     Duplicate Therapy     Allergy     No Medication Administration End Date     Incorrect Dose     Additional Drug Therapy Needed     Significant med changes from prior encounter (inform family/care partners about these prior to discharge).    Other       Clinically significant medication issues were identified that warrant physician communication and completion of prescribed/recommended actions by midnight of the next day:  No  Name of provider notified for urgent issues identified:   Provider Method of Notification:     Pharmacist comments:   Time spent performing this drug regimen review (minutes):  20   Bubba Vanbenschoten A. Jeanella Craze, PharmD, BCPS, FNKF Clinical Pharmacist [place] Please utilize Amion for appropriate phone number to reach the unit pharmacist [facility])  07/01/2022 9:35 AM

## 2022-07-02 ENCOUNTER — Other Ambulatory Visit (HOSPITAL_BASED_OUTPATIENT_CLINIC_OR_DEPARTMENT_OTHER): Payer: Self-pay

## 2022-07-02 ENCOUNTER — Telehealth: Payer: Self-pay

## 2022-07-02 MED ORDER — AMPHETAMINE-DEXTROAMPHETAMINE 20 MG PO TABS
20.0000 mg | ORAL_TABLET | Freq: Every day | ORAL | 0 refills | Status: DC
Start: 2022-07-02 — End: 2022-09-04
  Filled 2022-07-02 – 2022-07-11 (×2): qty 30, 30d supply, fill #0

## 2022-07-02 NOTE — Telephone Encounter (Signed)
Transitional Care call--patient    Are you/is patient experiencing any problems since coming home? No Are there any questions regarding any aspect of care? No Are there any questions regarding medications administration/dosing? No Are meds being taken as prescribed? Yes Patient should review meds with caller to confirm Have there been any falls? No Has Home Health been to the house and/or have they contacted you? [facility] If not, have you tried to contact them? Can we help you contact them? Are bowels and bladder emptying properly? Yes Are there any unexpected incontinence issues? Yes If applicable, is patient following bowel/bladder programs? Any fevers, problems with breathing, unexpected pain? No Are there any skin problems or new areas of breakdown? No Has the patient/family member arranged specialty MD follow up (ie cardiology/neurology/renal/surgical/etc)? Yes Can we help arrange? Does the patient need any other services or support that we can help arrange? No Are caregivers following through as expected in assisting the patient? No Has the patient quit smoking, drinking alcohol, or using drugs as recommended? Yes  Appointment time 10:40 am, arrive time 10:20 am with Dr. Berline Chough [address] [phone]

## 2022-07-02 NOTE — Progress Notes (Signed)
Inpatient Rehabilitation Care Coordinator Discharge Note   Patient Details  Name: Kayla [name] MRN: [id number] Date of Birth: [birth year]   Discharge location: D/c to home  Length of Stay: 9 days  Discharge activity level: Supervision  Home/community participation: Limited  Patient response KW:[id number] Literacy - How often do you need to have someone help you when you read instructions, pamphlets, or other written material from your doctor or pharmacy?: Never  Patient response HG:DJMEQA Isolation - How often do you feel lonely or isolated from those around you?: Never  Services provided included: MD, RD, PT, OT, RN, TR, CM, Pharmacy, Neuropsych, SW  Financial Services:  Financial Services Utilized: Medicare    Choices offered to/list presented to: Yes  Follow-up services arranged:  Outpatient    Outpatient Servicies: [facility] for PT/OT (pending their clinic approval)      Patient response to transportation need: Is the patient able to respond to transportation needs?: Yes In the past 12 months, has lack of transportation kept you from medical appointments or from getting medications?: No In the past 12 months, has lack of transportation kept you from meetings, work, or from getting things needed for daily living?: No   Comments (or additional information):  Patient/Family verbalized understanding of follow-up arrangements:  Yes  Individual responsible for coordination of the follow-up plan: contact pt #[phone] or pt husband Kayla [name] [phone]  Confirmed correct DME delivered: Gretchen Short 07/02/2022    Gretchen Short

## 2022-07-03 ENCOUNTER — Other Ambulatory Visit (HOSPITAL_BASED_OUTPATIENT_CLINIC_OR_DEPARTMENT_OTHER): Payer: Self-pay

## 2022-07-04 ENCOUNTER — Telehealth: Payer: Self-pay

## 2022-07-04 NOTE — Telephone Encounter (Signed)
SW confirms with [facility] (p:[phone]/f:[phone]) that pt will have services; appt scheduled for 4/17. SW faxed d/c summary.   1318-SW confirms with pt husband Homero Fellers that pt is scheduled for services.   Case closed to SW.   Cecile Sheerer, MSW, LCSWA Office: [phone] Cell: [phone] Fax: [phone]

## 2022-07-07 ENCOUNTER — Telehealth: Payer: Self-pay

## 2022-07-07 MED ORDER — HYDROMORPHONE HCL 4 MG PO TABS: 4.0000 mg | ORAL_TABLET | Freq: Four times a day (QID) | ORAL | 0 refills | Status: DC | PRN

## 2022-07-07 NOTE — Telephone Encounter (Signed)
Patient requesting refill on HYDROmorphone she said she was trying to stretch them out but will be taking her last one today

## 2022-07-08 ENCOUNTER — Ambulatory Visit (INDEPENDENT_AMBULATORY_CARE_PROVIDER_SITE_OTHER): Payer: Medicare Other | Admitting: Internal Medicine

## 2022-07-08 ENCOUNTER — Other Ambulatory Visit: Payer: Self-pay | Admitting: Internal Medicine

## 2022-07-08 ENCOUNTER — Encounter: Payer: Self-pay | Admitting: Internal Medicine

## 2022-07-08 VITALS — BP 122/64 | HR 83 | Temp 98.4°F | Resp 16 | Ht 63.0 in

## 2022-07-08 DIAGNOSIS — F3341 Major depressive disorder, recurrent, in partial remission: Secondary | ICD-10-CM | POA: Diagnosis not present

## 2022-07-08 DIAGNOSIS — J449 Chronic obstructive pulmonary disease, unspecified: Secondary | ICD-10-CM | POA: Diagnosis not present

## 2022-07-08 DIAGNOSIS — F5104 Psychophysiologic insomnia: Secondary | ICD-10-CM

## 2022-07-08 DIAGNOSIS — R600 Localized edema: Secondary | ICD-10-CM

## 2022-07-08 DIAGNOSIS — R64 Cachexia: Secondary | ICD-10-CM | POA: Diagnosis not present

## 2022-07-08 DIAGNOSIS — I75023 Atheroembolism of bilateral lower extremities: Secondary | ICD-10-CM | POA: Diagnosis not present

## 2022-07-08 MED ORDER — MIRTAZAPINE 7.5 MG PO TABS
7.5000 mg | ORAL_TABLET | Freq: Every day | ORAL | 0 refills | Status: DC
Start: 2022-07-08 — End: 2022-08-06

## 2022-07-08 MED ORDER — DRONABINOL 2.5 MG PO CAPS
2.5000 mg | ORAL_CAPSULE | Freq: Two times a day (BID) | ORAL | 0 refills | Status: DC
Start: 2022-07-08 — End: 2022-09-24

## 2022-07-08 MED ORDER — TORSEMIDE 10 MG PO TABS
10.0000 mg | ORAL_TABLET | Freq: Every day | ORAL | 0 refills | Status: DC
Start: 2022-07-08 — End: 2022-10-17

## 2022-07-08 NOTE — Progress Notes (Signed)
Subjective:  Patient ID: Kayla [name], female    DOB: [birth year]  Age: 72 y.o. MRN: [id number]  CC: Anemia   HPI Kayla [name] presents for f/up --  She was recently admitted for pelvic fracture.  She is controlling the pain with Dilaudid.  Her diuretics were discontinued but she complains of worsening lower extremity edema and purple feet.  Outpatient Medications Prior to Visit  Medication Sig Dispense Refill   albuterol (VENTOLIN HFA) 108 (90 Base) MCG/ACT inhaler INHALE 2 PUFFS INTO LUNGS EVERY 6 HOURS AS NEEDED. (Patient taking differently: Inhale 2 puffs into the lungs every 6 (six) hours as needed for wheezing or shortness of breath.) 18 g 0   amphetamine-dextroamphetamine (ADDERALL) 20 MG tablet Take 1 tablet (20 mg total) by mouth daily. 30 tablet 0   baclofen (LIORESAL) 20 MG tablet Take 20 mg by mouth in the morning.     calcium carbonate (OS-CAL - DOSED IN MG OF ELEMENTAL CALCIUM) 1250 (500 Ca) MG tablet Take 1 tablet (1,250 mg total) by mouth daily with breakfast.     capsaicin (ZOSTRIX) 0.025 % cream Apply topically every 6 (six) hours as needed (painful hip). 60 g 0   dexlansoprazole (DEXILANT) 60 MG capsule TAKE 1 CAPSULE BY MOUTH  ONCE DAILY WITH BREAKFAST 90 capsule 0   docusate sodium (COLACE) 100 MG capsule Take 1 capsule (100 mg total) by mouth daily as needed for mild constipation. 10 capsule 0   famotidine (PEPCID) 20 MG tablet One after supper (Patient taking differently: Take 20 mg by mouth at bedtime. One after supper) 30 tablet 11   Ferric Maltol (ACCRUFER) 30 MG CAPS Take 1 capsule by mouth in the morning and at bedtime. 180 capsule 0   FLUoxetine (PROZAC) 40 MG capsule Take 1 capsule (40 mg total) by mouth daily. 90 capsule 1   Fluticasone-Umeclidin-Vilant (TRELEGY ELLIPTA) 100-62.5-25 MCG/ACT AEPB Inhale 1 puff into the lungs daily. 120 each 1   HYDROmorphone (DILAUDID) 4 MG tablet Take 1 tablet (4 mg total) by mouth every 6 (six) hours as needed for severe  pain. 90 tablet 0   Immune Globulin, Human,-klhw (XEMBIFY Roxie) Inject into the skin.     levothyroxine (SYNTHROID) 100 MCG tablet TAKE 1 TABLET BY MOUTH ONCE  DAILY BEFORE BREAKFAST 90 tablet 3   methenamine (HIPREX) 1 g tablet Take 1 tablet (1 g total) by mouth 2 (two) times daily with a meal. 180 tablet 1   methocarbamol (ROBAXIN) 500 MG tablet Take 1 tablet (500 mg total) by mouth 4 (four) times daily. 120 tablet 0   mirabegron ER (MYRBETRIQ) 25 MG TB24 tablet Take 1 tablet (25 mg total) by mouth every other day. 15 tablet 0   Multiple Vitamin (MULTIVITAMIN WITH MINERALS) TABS tablet Take 1 tablet by mouth daily.     Probiotic Product (PROBIOTIC PO) Take 1 capsule by mouth daily. Harmony probiotic     Respiratory Therapy Supplies (FLUTTER) DEVI Use as directed 1 each 0   sodium chloride HYPERTONIC 3 % nebulizer solution Take by nebulization 2 (two) times daily as needed for cough. 750 mL 1   tamsulosin (FLOMAX) 0.4 MG CAPS capsule Take 0.4 mg by mouth daily.     trimethoprim (TRIMPEX) 100 MG tablet Take 100 mg by mouth daily.     temazepam (RESTORIL) 15 MG capsule Take 1 capsule (15 mg total) by mouth at bedtime. 30 capsule 1   No facility-administered medications prior to visit.    ROS Review  of Systems  Constitutional:  Positive for fatigue. Negative for appetite change, chills, diaphoresis and unexpected weight change.  HENT:  Positive for trouble swallowing and voice change.   Respiratory:  Positive for cough, shortness of breath and wheezing. Negative for chest tightness.   Cardiovascular:  Positive for leg swelling. Negative for chest pain and palpitations.  Gastrointestinal:  Negative for abdominal pain, diarrhea, nausea and vomiting.  Genitourinary: Negative.  Negative for difficulty urinating.  Musculoskeletal:  Positive for arthralgias, back pain and gait problem. Negative for joint swelling.  Skin: Negative.   Neurological:  Positive for weakness and numbness. Negative for  dizziness.  Hematological:  Negative for adenopathy. Does not bruise/bleed easily.  Psychiatric/Behavioral:  Positive for dysphoric mood and sleep disturbance. Negative for behavioral problems, confusion and decreased concentration. The patient is not nervous/anxious.     Objective:  BP 122/64 (BP Location: Left Arm, Patient Position: Sitting, Cuff Size: Normal)   Pulse 83   Temp 98.4 F (36.9 C) (Oral)   Resp 16   Ht 5\' 3"  (1.6 m)   SpO2 99%   BMI 19.72 kg/m   BP Readings from Last 3 Encounters:  07/08/22 122/64  07/01/22 (!) 118/57  06/22/22 133/63    Wt Readings from Last 3 Encounters:  06/22/22 111 lb 5.3 oz (50.5 kg)  05/26/22 106 lb (48.1 kg)  05/05/22 108 lb (49 kg)    Physical Exam Vitals reviewed.  Constitutional:      General: She is not in acute distress.    Appearance: She is ill-appearing. She is not toxic-appearing or diaphoretic.  HENT:     Nose: Nose normal.     Mouth/Throat:     Mouth: Mucous membranes are moist.     Pharynx: No oropharyngeal exudate.  Eyes:     General: No scleral icterus.    Conjunctiva/sclera: Conjunctivae normal.  Cardiovascular:     Rate and Rhythm: Normal rate and regular rhythm.     Pulses:          Dorsalis pedis pulses are 1+ on the right side and 1+ on the left side.       Posterior tibial pulses are 0 on the right side and 0 on the left side.     Heart sounds: Normal heart sounds, S1 normal and S2 normal. No murmur heard.    No friction rub.     Comments: Both feet are purplish color with decreased capillary refill. Pulmonary:     Effort: Pulmonary effort is normal.     Breath sounds: Examination of the right-upper field reveals decreased breath sounds. Examination of the left-upper field reveals decreased breath sounds. Examination of the right-middle field reveals decreased breath sounds, wheezing and rhonchi. Examination of the left-middle field reveals decreased breath sounds, wheezing and rhonchi. Examination of the  right-lower field reveals decreased breath sounds. Examination of the left-lower field reveals decreased breath sounds. Decreased breath sounds, wheezing and rhonchi present. No rales.  Abdominal:     General: Abdomen is flat.     Palpations: There is no mass.     Tenderness: There is no abdominal tenderness. There is no guarding.     Hernia: No hernia is present.  Musculoskeletal:     Cervical back: Neck supple.     Right lower leg: 1+ Pitting Edema present.     Left lower leg: 1+ Pitting Edema present.  Feet:     Right foot:     Skin integrity: Skin integrity normal.  Left foot:     Skin integrity: Skin integrity normal.  Neurological:     Mental Status: Mental status is at baseline.     Motor: Weakness present.     Gait: Gait abnormal.     Lab Results  Component Value Date   WBC 5.6 06/24/2022   HGB 11.1 (L) 06/24/2022   HCT 32.8 (L) 06/24/2022   PLT 217 06/24/2022   GLUCOSE 90 06/24/2022   CHOL 169 01/14/2022   TRIG 157.0 (H) 01/14/2022   HDL 41.50 01/14/2022   LDLDIRECT 116.1 06/25/2010   LDLCALC 96 01/14/2022   ALT 19 06/20/2022   AST 31 06/20/2022   NA 130 (L) 06/24/2022   K 4.2 06/24/2022   CL 95 (L) 06/24/2022   CREATININE 0.81 06/24/2022   BUN 17 06/24/2022   CO2 26 06/24/2022   TSH 4.01 05/26/2022   INR 1.0 10/25/2020   HGBA1C 5.6 02/25/2013    DG Chest 2 View  Result Date: 06/27/2022 CLINICAL DATA:  Cough. EXAM: CHEST - 2 VIEW COMPARISON:  06/19/2022 and older FINDINGS: Underinflation. Left basilar opacities slightly improved with some residual. There is some increasing right basilar opacity but worsening inflation today. Subtle right midlung opacity as well. No pneumothorax or effusion. No edema. Stable cardiopericardial silhouette. Lordotic x-ray. IMPRESSION: Worsening inflation with increasing right mid right lung base opacity. Decreasing left lung base opacity with some residual. Recommend continued follow-up Electronically Signed   By: Karen Kays  M.D.   On: 06/27/2022 17:21   DG Abd 1 View  Result Date: 06/27/2022 CLINICAL DATA:  Constipation by delayed colonic transit EXAM: ABDOMEN - 1 VIEW COMPARISON:  11/08/2020 abdominal CT FINDINGS: Formed stool distends the proximal colon and transverse colon. Gas distends the descending colon. No concerning mass effect or gas collection. Ingested pill seen over the left upper quadrant/stomach. Prior pelvic lymphadenectomy. Right hip replacement. Streaky density at the lung bases seen on comparison CT is well. IMPRESSION: Stool distended proximal and transverse colon. Electronically Signed   By: Tiburcio Pea M.D.   On: 06/27/2022 08:10   CT Hip Right Wo Contrast  Result Date: 06/19/2022 CLINICAL DATA:  Hip trauma, fracture suspected, xray done EXAM: CT OF THE RIGHT HIP WITHOUT CONTRAST TECHNIQUE: Multidetector CT imaging of the right hip was performed according to the standard protocol. Multiplanar CT image reconstructions were also generated. RADIATION DOSE REDUCTION: This exam was performed according to the departmental dose-optimization program which includes automated exposure control, adjustment of the mA and/or kV according to patient size and/or use of iterative reconstruction technique. COMPARISON:  X-ray right hip 06/19/2022 FINDINGS: Bones/Joint/Cartilage Total right hip arthroplasty. Full shaft width inferior right pubic rami fracture. Associated nondisplaced right posterior acetabulum fracture (3:55). Suggestion of an acute nondisplaced anterior right acetabular fracture (9:56). Ligaments Suboptimally assessed by CT. Muscles and Tendons Grossly unremarkable. Soft tissues Gluteal coarsened calcifications along the ischial tuberosities and lower medial gluteal soft tissues. Foci of subcutaneus soft tissue emphysema along the anterior right pelvis as well as multiple foci of emphysema along the right proximal medial thigh of unclear etiology. Other: Lymph node dissection vascular clips throughout the  pelvis. IMPRESSION: 1. Full shaft width inferior right pubic rami fracture. Associated nondisplaced right posterior acetabulum fracture. Suggestion of an acute nondisplaced anterior right acetabular fracture 2. Total right hip arthroplasty. No CT finding to suggest surgical hardware complication. 3. Foci of subcutaneus soft tissue emphysema along the anterior right pelvis as well as multiple foci of emphysema along the right proximal medial  thigh of unclear etiology. Finding may be related to infection versus trauma more distally. Correlate with physical exam. Electronically Signed   By: Tish Frederickson M.D.   On: 06/19/2022 22:06   CT Head Wo Contrast  Result Date: 06/19/2022 CLINICAL DATA:  Polytrauma, blunt.  Mechanical fall. EXAM: CT HEAD WITHOUT CONTRAST CT CERVICAL SPINE WITHOUT CONTRAST TECHNIQUE: Multidetector CT imaging of the head and cervical spine was performed following the standard protocol without intravenous contrast. Multiplanar CT image reconstructions of the cervical spine were also generated. RADIATION DOSE REDUCTION: This exam was performed according to the departmental dose-optimization program which includes automated exposure control, adjustment of the mA and/or kV according to patient size and/or use of iterative reconstruction technique. COMPARISON:  08/13/2021, 10/27/2003. FINDINGS: CT HEAD FINDINGS Brain: No acute intracranial hemorrhage, midline shift or mass effect. No extra-axial fluid collection. Periventricular white matter hypodensities are present bilaterally. No hydrocephalus. Vascular: No hyperdense vessel or unexpected calcification. Skull: Normal. Negative for fracture or focal lesion. Sinuses/Orbits: Small opacity is noted in the ethmoid air cells on the right. No acute orbital abnormality. Other: Small subcutaneous fat stranding over the occipital bone on the right, possible contusion. CT CERVICAL SPINE FINDINGS Alignment: There is mild anterolisthesis at C3-C4, C4-C5,  and C7-T1. Skull base and vertebrae: No acute fracture. No primary bone lesion or focal pathologic process. Soft tissues and spinal canal: No prevertebral fluid or swelling. No visible canal hematoma. Disc levels: Multilevel intervertebral disc space narrowing, uncovertebral osteophyte formation and facet arthropathy, most pronounced at C5-C6 and C6-C7. Upper chest: No acute abnormality. Other: None. IMPRESSION: 1. No acute intracranial process. 2. Chronic microvascular ischemic changes. 3. Multilevel degenerative changes in the cervical spine without evidence of acute fracture. Electronically Signed   By: Thornell Sartorius M.D.   On: 06/19/2022 20:29   CT Cervical Spine Wo Contrast  Result Date: 06/19/2022 CLINICAL DATA:  Polytrauma, blunt.  Mechanical fall. EXAM: CT HEAD WITHOUT CONTRAST CT CERVICAL SPINE WITHOUT CONTRAST TECHNIQUE: Multidetector CT imaging of the head and cervical spine was performed following the standard protocol without intravenous contrast. Multiplanar CT image reconstructions of the cervical spine were also generated. RADIATION DOSE REDUCTION: This exam was performed according to the departmental dose-optimization program which includes automated exposure control, adjustment of the mA and/or kV according to patient size and/or use of iterative reconstruction technique. COMPARISON:  08/13/2021, 10/27/2003. FINDINGS: CT HEAD FINDINGS Brain: No acute intracranial hemorrhage, midline shift or mass effect. No extra-axial fluid collection. Periventricular white matter hypodensities are present bilaterally. No hydrocephalus. Vascular: No hyperdense vessel or unexpected calcification. Skull: Normal. Negative for fracture or focal lesion. Sinuses/Orbits: Small opacity is noted in the ethmoid air cells on the right. No acute orbital abnormality. Other: Small subcutaneous fat stranding over the occipital bone on the right, possible contusion. CT CERVICAL SPINE FINDINGS Alignment: There is mild  anterolisthesis at C3-C4, C4-C5, and C7-T1. Skull base and vertebrae: No acute fracture. No primary bone lesion or focal pathologic process. Soft tissues and spinal canal: No prevertebral fluid or swelling. No visible canal hematoma. Disc levels: Multilevel intervertebral disc space narrowing, uncovertebral osteophyte formation and facet arthropathy, most pronounced at C5-C6 and C6-C7. Upper chest: No acute abnormality. Other: None. IMPRESSION: 1. No acute intracranial process. 2. Chronic microvascular ischemic changes. 3. Multilevel degenerative changes in the cervical spine without evidence of acute fracture. Electronically Signed   By: Thornell Sartorius M.D.   On: 06/19/2022 20:29   DG Chest Portable 1 View  Result Date: 06/19/2022 CLINICAL DATA:  Status post fall EXAM: PORTABLE CHEST 1 VIEW COMPARISON:  01/10/2022 FINDINGS: Cardiac shadow is within normal limits. Left basilar atelectasis is noted. Lungs are otherwise clear. No bony abnormality is noted. IMPRESSION: Mild left basilar atelectasis. Electronically Signed   By: Alcide Clever M.D.   On: 06/19/2022 20:02   DG HIP UNILAT WITH PELVIS 2-3 VIEWS RIGHT  Result Date: 06/19/2022 CLINICAL DATA:  Status post recent fall with right hip pain, initial encounter EXAM: DG HIP (WITH OR WITHOUT PELVIS) 3V RIGHT COMPARISON:  06/06/2017 FINDINGS: Dystrophic calcifications are again identified about the hip joints and within the gluteal soft tissues. Right hip replacement is again noted and appears well seated. No acute fracture is noted. No other focal abnormality is seen. IMPRESSION: No acute fracture noted. Evidence of prosthetic dislocation. Chronic dystrophic calcifications about the hip joints. Electronically Signed   By: Alcide Clever M.D.   On: 06/19/2022 19:59     Assessment & Plan:   Bilateral leg edema- Will restart a loop diuretic. -     Torsemide; Take 1 tablet (10 mg total) by mouth daily.  Dispense: 90 tablet; Refill: 0  Pulmonary cachexia due to  chronic obstructive pulmonary disease -     droNABinol; Take 1 capsule (2.5 mg total) by mouth 2 (two) times daily before lunch and supper.  Dispense: 60 capsule; Refill: 0 -     Mirtazapine; Take 1 tablet (7.5 mg total) by mouth at bedtime.  Dispense: 30 tablet; Refill: 0  Recurrent major depressive disorder, in partial remission -     Mirtazapine; Take 1 tablet (7.5 mg total) by mouth at bedtime.  Dispense: 30 tablet; Refill: 0  Psychophysiological insomnia -     Mirtazapine; Take 1 tablet (7.5 mg total) by mouth at bedtime.  Dispense: 30 tablet; Refill: 0 -     Temazepam; Take 1 capsule (15 mg total) by mouth at bedtime.  Dispense: 90 capsule; Refill: 0  Purple toe syndrome of both feet- Will decrease the edema with a loop diuretic.     Follow-up: Return in about 3 months (around 10/07/2022).  Sanda Linger, MD

## 2022-07-08 NOTE — Patient Instructions (Signed)
Peripheral Edema  Peripheral edema is swelling that is caused by a buildup of fluid. Peripheral edema most often affects the lower legs, ankles, and feet. It can also develop in the arms, hands, and face. The area of the body that has peripheral edema will look swollen. It may also feel heavy or warm. Your clothes may start to feel tight. Pressing on the area may make a temporary dent in your skin (pitting edema). You may not be able to move your swollen arm or leg as much as usual. There are many causes of peripheral edema. It can happen because of a complication of other conditions such as heart failure, kidney disease, or a problem with your circulation. It also can be a side effect of certain medicines or happen because of an infection. It often happens to women during pregnancy. Sometimes, the cause is not known. Follow these instructions at home: Managing pain, stiffness, and swelling  Raise (elevate) your legs while you are sitting or lying down. Move around often to prevent stiffness and to reduce swelling. Do not sit or stand for long periods of time. Do not wear tight clothing. Do not wear garters on your upper legs. Exercise your legs to get your circulation going. This helps to move the fluid back into your blood vessels, and it may help the swelling go down. Wear compression stockings as told by your health care provider. These stockings help to prevent blood clots and reduce swelling in your legs. It is important that these are the correct size. These stockings should be prescribed by your doctor to prevent possible injuries. If elastic bandages or wraps are recommended, use them as told by your health care provider. Medicines Take over-the-counter and prescription medicines only as told by your health care provider. Your health care provider may prescribe medicine to help your body get rid of excess water (diuretic). Take this medicine if you are told to take it. General  instructions Eat a low-salt (low-sodium) diet as told by your health care provider. Sometimes, eating less salt may reduce swelling. Pay attention to any changes in your symptoms. Moisturize your skin daily to help prevent skin from cracking and draining. Keep all follow-up visits. This is important. Contact a health care provider if: You have a fever. You have swelling in only one leg. You have increased swelling, redness, or pain in one or both of your legs. You have drainage or sores at the area where you have edema. Get help right away if: You have edema that starts suddenly or is getting worse, especially if you are pregnant or have a medical condition. You develop shortness of breath, especially when you are lying down. You have pain in your chest or abdomen. You feel weak. You feel like you will faint. These symptoms may be an emergency. Get help right away. Call 911. Do not wait to see if the symptoms will go away. Do not drive yourself to the hospital. Summary Peripheral edema is swelling that is caused by a buildup of fluid. Peripheral edema most often affects the lower legs, ankles, and feet. Move around often to prevent stiffness and to reduce swelling. Do not sit or stand for long periods of time. Pay attention to any changes in your symptoms. Contact a health care provider if you have edema that starts suddenly or is getting worse, especially if you are pregnant or have a medical condition. Get help right away if you develop shortness of breath, especially when lying down.   This information is not intended to replace advice given to you by your health care provider. Make sure you discuss any questions you have with your health care provider. Document Revised: 11/12/2020 Document Reviewed: 11/12/2020 Elsevier Patient Education  2023 [employer]  

## 2022-07-09 DIAGNOSIS — Z96641 Presence of right artificial hip joint: Secondary | ICD-10-CM | POA: Diagnosis not present

## 2022-07-09 DIAGNOSIS — S32511D Fracture of superior rim of right pubis, subsequent encounter for fracture with routine healing: Secondary | ICD-10-CM | POA: Diagnosis not present

## 2022-07-10 ENCOUNTER — Other Ambulatory Visit (HOSPITAL_BASED_OUTPATIENT_CLINIC_OR_DEPARTMENT_OTHER): Payer: Self-pay

## 2022-07-11 ENCOUNTER — Other Ambulatory Visit (HOSPITAL_BASED_OUTPATIENT_CLINIC_OR_DEPARTMENT_OTHER): Payer: Self-pay

## 2022-07-11 ENCOUNTER — Other Ambulatory Visit: Payer: Self-pay | Admitting: Internal Medicine

## 2022-07-11 DIAGNOSIS — N3946 Mixed incontinence: Secondary | ICD-10-CM | POA: Diagnosis not present

## 2022-07-11 DIAGNOSIS — R3914 Feeling of incomplete bladder emptying: Secondary | ICD-10-CM | POA: Diagnosis not present

## 2022-07-11 MED ORDER — TEMAZEPAM 15 MG PO CAPS
15.0000 mg | ORAL_CAPSULE | Freq: Every day | ORAL | 0 refills | Status: DC
Start: 2022-07-11 — End: 2022-10-27

## 2022-07-14 ENCOUNTER — Encounter: Payer: Self-pay | Admitting: Physical Medicine and Rehabilitation

## 2022-07-14 ENCOUNTER — Encounter
Payer: Medicare Other | Attending: Physical Medicine and Rehabilitation | Admitting: Physical Medicine and Rehabilitation

## 2022-07-14 VITALS — BP 105/61 | HR 67 | Ht 63.0 in | Wt 109.0 lb

## 2022-07-14 DIAGNOSIS — G35 Multiple sclerosis: Secondary | ICD-10-CM

## 2022-07-14 DIAGNOSIS — S32401A Unspecified fracture of right acetabulum, initial encounter for closed fracture: Secondary | ICD-10-CM | POA: Diagnosis not present

## 2022-07-14 DIAGNOSIS — S32591D Other specified fracture of right pubis, subsequent encounter for fracture with routine healing: Secondary | ICD-10-CM | POA: Insufficient documentation

## 2022-07-14 DIAGNOSIS — S32592D Other specified fracture of left pubis, subsequent encounter for fracture with routine healing: Secondary | ICD-10-CM

## 2022-07-14 NOTE — Patient Instructions (Signed)
Pt is a 72 yr old female with hx of MS and R anterior and posterior acetabular fx's as well as Inferior pubic rami fx; Also has vocal cord dysfunction from MS.   Here for hospital f/u on fractures from fall.   Doesn't think will need more pain meds past Rx she has- has 77 pills left still. Thinks that will be plenty  2.  Can do opiate contract and UDS if decides wants to do a longer taper of pain meds.   3. A lot of patients ask and you CAN split the pain pill- so as taper off pain meds, can go to 1/2 tab 2x/day for example.   4. Prior to fall, was walking with RW and Independent-   5.  Still needs therapy- to work on MS- strengthening and endurance- so can avoid going below "dependence line".    6. I'm happy to see you now or in future- for your MS- and how to manage Symptoms from MS.  - spasticity, assistive devices, focuses on therapy what really helpful.   7. F/U as needed

## 2022-07-14 NOTE — Progress Notes (Signed)
Subjective:    Patient ID: Kayla [name], female    DOB: 01/27/1951, 72 y.o.   MRN: [id number]  HPI  Pt is a 72 yr old female with hx of MS and R anterior and posterior acetabular fx's as well as Inferior pubic rami fx;   Here for hospital f/u on fractures from fall.   Every day going a little better.  Yesterday the best day she's had since the day before she fell.  Per husband as well.   Got refill on Dilaudid - not on pain meds before fx's.   Usually takes Dilaudid 1x/day-  Asking if can take at night as well?  Had diarrhea last night- one extreme to the other.  Constipation has been getting a little better, actually, even before diarrhea.   Hasn't taken Tessalon pearls lately for cough.   Wheezing, but is normal for her-  Feels like getting a cold- getting runny nose, etc and doesn't have allergies.    Saw Ortho- everything healing- WBAT now.  No PT necessary- can go back to PT to work on UB strength-  Hasn't been doing any PT or OT since home.  Neuro and Jackie Plum has arranged for UB PT already.   Dr Jake Seats- is MS Neurologist- and prescribes the therapy.  Waiting to restart until pain is better.   Saw PCP and Urologist  Just endures pain- sitting/lying and standing, doesn't matter about pain.  Coughing- spasmodic- reaction.   Pain now 2-4/10- in hospital was 7-8/10.  Only bothers her when she coughs, when she takes pain meds.   Was able to get into car to get here- without needing A to get legs into car for first time.          Pain Inventory Average Pain 2 Pain Right Now 4 My pain is sharp and stabbing  In the last 24 hours, has pain interfered with the following? General activity 3 Relation with others 3 Enjoyment of life 3 What TIME of day is your pain at its worst? morning  Sleep (in general) Fair  Pain is worse with: walking, inactivity, and standing Pain improves with: medication Relief from Meds: 5  use a walker how many minutes can you  walk? 3 ability to climb steps?  yes do you drive?  yes use a wheelchair transfers alone  I need assistance with the following:  bathing, toileting, meal prep, household duties, and shopping  bladder control problems weakness trouble walking  TC appt  TC appt    Family History  Problem Relation Age of Onset   Other Mother        COVID   Colon cancer Father    Atrial fibrillation Brother    Heart attack Maternal Grandfather    Parkinson's disease Paternal Grandfather    Esophageal cancer Neg Hx    Stomach cancer Neg Hx    Rectal cancer Neg Hx    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  Tobacco Use   Smoking status: Never   Smokeless tobacco: Never  Vaping Use   Vaping Use: Never used  Substance and Sexual Activity   Alcohol use: Yes    Alcohol/week: 2.0 standard drinks of alcohol    Types: 1 Glasses of wine,  1 Cans of beer per week    Comment: 2x/week    Drug use: No   Sexual activity: Not on file  Other Topics Concern   Not on file  Social History Narrative   Not on file   Social Determinants of Health   Financial Resource Strain: Low Risk  (07/07/2022)   Overall Financial Resource Strain (CARDIA)    Difficulty of Paying Living Expenses: Not very hard  Food Insecurity: No Food Insecurity (07/07/2022)   Hunger Vital Sign    Worried About Running Out of Food in the Last Year: Never true    Ran Out of Food in the Last Year: Never true  Transportation Needs: No Transportation Needs (07/07/2022)   PRAPARE - Administrator, Civil Service (Medical): No    Lack of Transportation (Non-Medical): No  Physical Activity: Inactive (07/07/2022)   Exercise Vital Sign    Days of Exercise per Week: 0 days    Minutes of Exercise per Session: 60 min   Stress: No Stress Concern Present (07/07/2022)   [employer] of Occupational Health - Occupational Stress Questionnaire    Feeling of Stress : Only a little  Social Connections: Socially Integrated (07/07/2022)   Social Connection and Isolation Panel [NHANES]    Frequency of Communication with Friends and Family: Three times a week    Frequency of Social Gatherings with Friends and Family: Once a week    Attends Religious Services: 1 to 4 times per year    Active Member of Clubs or Organizations: Yes    Attends Banker Meetings: 1 to 4 times per year    Marital Status: Married   Past Surgical History:  Procedure Laterality Date   ABDOMINAL HYSTERECTOMY  03/25/2003   for cervical cancer, complete   BRONCHIAL BIOPSY  10/26/2020   Procedure: BRONCHIAL BIOPSIES;  Surgeon: Josephine Igo, DO;  Location: [facility];  Service: Pulmonary;;   BRONCHIAL BRUSHINGS  10/26/2020   Procedure: BRONCHIAL BRUSHINGS;  Surgeon: Josephine Igo, DO;  Location: [facility];  Service: Pulmonary;;   BRONCHIAL WASHINGS  10/26/2020   Procedure: BRONCHIAL WASHINGS;  Surgeon: Josephine Igo, DO;  Location: [facility];  Service: Pulmonary;;   CATARACT EXTRACTION, BILATERAL Bilateral    lens for cataracts   CONVERSION TO TOTAL HIP Right 09/18/2015   Procedure: CONVERSION OF RIGHT HEMI ARTHROPLASTY TO TOTAL HIP ARTHROPLASTY ACETABULAR REVISON ;  Surgeon: Durene Romans, MD;  Location: [facility];  Service: Orthopedics;  Laterality: Right;   HIP ARTHROPLASTY Right 03/01/2013   Procedure: ARTHROPLASTY BIPOLAR HIP;  Surgeon: Shelda Pal, MD;  Location: [facility];  Service: Orthopedics;  Laterality: Right;   HIP CLOSED REDUCTION Right 03/12/2013   Procedure: CLOSED REDUCTION HIP;  Surgeon: [name], MD;  Location: [facility];  Service: Orthopedics;  Laterality: Right;   HIP CLOSED REDUCTION Right 03/19/2013   Procedure: CLOSED REDUCTION HIP;  Surgeon: Javier Docker, MD;  Location: [facility];   Service: Orthopedics;  Laterality: Right;   HIP CLOSED REDUCTION Right 11/10/2015   Procedure: CLOSED MANIPULATION HIP;  Surgeon: Samson Frederic, MD;  Location: [facility];  Service: Orthopedics;  Laterality: Right;   HIP CLOSED REDUCTION Right 06/05/2017   Procedure: CLOSED REDUCTION HIP;  Surgeon: Ranee Gosselin, MD;  Location: [facility];  Service: Orthopedics;  Laterality: Right;   SMALL INTESTINE SURGERY     TOTAL HIP REVISION Right 06/06/2017   Procedure: TOTAL HIP REVISION;  Surgeon: Ranee Gosselin, MD;  Location: [facility];  Service: Orthopedics;  Laterality: Right;   VESICOVAGINAL FISTULA CLOSURE W/ TAH  03/25/2003   VIDEO BRONCHOSCOPY N/A 10/26/2020   Procedure: VIDEO BRONCHOSCOPY WITH FLUORO;  Surgeon: Josephine Igo, DO;  Location: [facility];  Service: Pulmonary;  Laterality: N/A;   Past Medical History:  Diagnosis Date   Arthritis    oa   Cervical cancer 2005   COPD (chronic obstructive pulmonary disease)    Multiple sclerosis    Multiple sclerosis 1996   Neuromuscular disorder    Osteoporosis    Other diseases of vocal cords    vocal cords are not even   Other voice and resonance disorders    Pneumonia 2014, 1992   Sleep apnea    no cpap used   Unspecified hypothyroidism    BP 105/61   Pulse 67   Ht 5\' 3"  (1.6 m)   Wt 109 lb (49.4 kg)   SpO2 92%   BMI 19.31 kg/m   Opioid Risk Score:   Fall Risk Score:  `1  Depression screen [facility] 2/9     07/14/2022   11:09 AM 05/22/2022    9:17 AM 01/16/2022    2:02 PM 01/10/2022    1:22 PM 11/27/2021   10:15 AM 11/27/2021   10:14 AM 10/04/2021   10:14 AM  Depression screen PHQ 2/9  Decreased Interest 2 0 0 0 0 0 0  Down, Depressed, Hopeless 1 0 0 0 0 0 0  PHQ - 2 Score 3 0 0 0 0 0 0  Altered sleeping 1  0 0 0    Tired, decreased energy 1  2 0 0    Change in appetite 2  0 0 0    Feeling bad or failure about yourself  0  0 0 0    Trouble concentrating 1  0 0 0    Moving slowly or fidgety/restless 2   0 0    Suicidal  thoughts 0  0 0 0    PHQ-9 Score 10  2 0 0    Difficult doing work/chores Very difficult  Not difficult at all Not difficult at all Not difficult at all        Review of Systems  Cardiovascular:  Negative for leg swelling.  Musculoskeletal:        Pelvic pain  All other systems reviewed and are negative.      Objective:   Physical Exam  Awake , alert, appropriate, in transport w/c; accompanied by husband, NAD MS: 4/5 except R HF 4-/5 in LE's B/L   Swelling 2-3+ LE swelling to ankles B/L - pitting      Assessment & Plan:   Pt is a 72 yr old female with hx of MS and R anterior and posterior acetabular fx's as well as Inferior pubic rami fx; Also has vocal cord dysfunction from MS.   Here for hospital f/u on fractures from fall.   Doesn't think will need more pain meds past Rx she has- has 77 pills left still. Thinks that will be plenty  2.  Can do opiate contract and UDS if decides wants to do a longer taper of pain meds.   3. A lot of patients ask and you CAN split the pain pill- so as taper off pain meds, can go to 1/2 tab 2x/day for example.   4. Prior to fall, was walking with RW and Independent-   5.  Still needs therapy- to work on MS- strengthening and endurance-  so can avoid going below "dependence line".    6. I'm happy to see you now or in future- for your MS- and how to manage Symptoms from MS.  - spasticity, assistive devices, focuses on therapy what really helpful.   7. F/U as needed   I spent a total of 23   minutes on total care today- >50% coordination of care- due to d/w pt on MS and recovery from fractures- and tapering of pain meds

## 2022-07-18 DIAGNOSIS — M25551 Pain in right hip: Secondary | ICD-10-CM | POA: Insufficient documentation

## 2022-07-22 ENCOUNTER — Other Ambulatory Visit: Payer: Self-pay | Admitting: Internal Medicine

## 2022-07-22 ENCOUNTER — Encounter: Payer: Self-pay | Admitting: Internal Medicine

## 2022-07-22 DIAGNOSIS — D508 Other iron deficiency anemias: Secondary | ICD-10-CM

## 2022-07-22 MED ORDER — ACCRUFER 30 MG PO CAPS: 1.0000 | ORAL_CAPSULE | Freq: Two times a day (BID) | ORAL | 0 refills | Status: DC

## 2022-07-23 DIAGNOSIS — R260 Ataxic gait: Secondary | ICD-10-CM | POA: Diagnosis not present

## 2022-07-23 DIAGNOSIS — S42211D Unspecified displaced fracture of surgical neck of right humerus, subsequent encounter for fracture with routine healing: Secondary | ICD-10-CM | POA: Diagnosis not present

## 2022-07-23 DIAGNOSIS — G35 Multiple sclerosis: Secondary | ICD-10-CM | POA: Diagnosis not present

## 2022-07-23 DIAGNOSIS — R296 Repeated falls: Secondary | ICD-10-CM | POA: Diagnosis not present

## 2022-07-30 DIAGNOSIS — S42211D Unspecified displaced fracture of surgical neck of right humerus, subsequent encounter for fracture with routine healing: Secondary | ICD-10-CM | POA: Diagnosis not present

## 2022-07-30 DIAGNOSIS — R296 Repeated falls: Secondary | ICD-10-CM | POA: Diagnosis not present

## 2022-07-30 DIAGNOSIS — G35 Multiple sclerosis: Secondary | ICD-10-CM | POA: Diagnosis not present

## 2022-07-30 DIAGNOSIS — R260 Ataxic gait: Secondary | ICD-10-CM | POA: Diagnosis not present

## 2022-08-01 DIAGNOSIS — R260 Ataxic gait: Secondary | ICD-10-CM | POA: Diagnosis not present

## 2022-08-01 DIAGNOSIS — G35 Multiple sclerosis: Secondary | ICD-10-CM | POA: Diagnosis not present

## 2022-08-01 DIAGNOSIS — S42211D Unspecified displaced fracture of surgical neck of right humerus, subsequent encounter for fracture with routine healing: Secondary | ICD-10-CM | POA: Diagnosis not present

## 2022-08-01 DIAGNOSIS — R296 Repeated falls: Secondary | ICD-10-CM | POA: Diagnosis not present

## 2022-08-06 ENCOUNTER — Other Ambulatory Visit: Payer: Self-pay | Admitting: Internal Medicine

## 2022-08-06 DIAGNOSIS — S42211D Unspecified displaced fracture of surgical neck of right humerus, subsequent encounter for fracture with routine healing: Secondary | ICD-10-CM | POA: Diagnosis not present

## 2022-08-06 DIAGNOSIS — R260 Ataxic gait: Secondary | ICD-10-CM | POA: Diagnosis not present

## 2022-08-06 DIAGNOSIS — G35 Multiple sclerosis: Secondary | ICD-10-CM | POA: Diagnosis not present

## 2022-08-06 DIAGNOSIS — R296 Repeated falls: Secondary | ICD-10-CM | POA: Diagnosis not present

## 2022-08-06 DIAGNOSIS — J449 Chronic obstructive pulmonary disease, unspecified: Secondary | ICD-10-CM

## 2022-08-06 DIAGNOSIS — N390 Urinary tract infection, site not specified: Secondary | ICD-10-CM

## 2022-08-06 DIAGNOSIS — F5104 Psychophysiologic insomnia: Secondary | ICD-10-CM

## 2022-08-06 DIAGNOSIS — F3341 Major depressive disorder, recurrent, in partial remission: Secondary | ICD-10-CM

## 2022-08-07 ENCOUNTER — Ambulatory Visit (HOSPITAL_COMMUNITY)
Admission: RE | Admit: 2022-08-07 | Discharge: 2022-08-07 | Disposition: A | Discharge status: Home / Self Care | Payer: Medicare Other | Source: Ambulatory Visit | Attending: Internal Medicine | Admitting: Internal Medicine

## 2022-08-07 DIAGNOSIS — Z8701 Personal history of pneumonia (recurrent): Secondary | ICD-10-CM | POA: Insufficient documentation

## 2022-08-07 DIAGNOSIS — R053 Chronic cough: Secondary | ICD-10-CM | POA: Insufficient documentation

## 2022-08-07 DIAGNOSIS — J45991 Cough variant asthma: Secondary | ICD-10-CM | POA: Diagnosis not present

## 2022-08-07 DIAGNOSIS — R0609 Other forms of dyspnea: Secondary | ICD-10-CM | POA: Insufficient documentation

## 2022-08-07 DIAGNOSIS — J3800 Paralysis of vocal cords and larynx, unspecified: Secondary | ICD-10-CM | POA: Insufficient documentation

## 2022-08-07 DIAGNOSIS — J479 Bronchiectasis, uncomplicated: Secondary | ICD-10-CM | POA: Diagnosis not present

## 2022-08-07 DIAGNOSIS — J189 Pneumonia, unspecified organism: Secondary | ICD-10-CM | POA: Diagnosis not present

## 2022-08-08 DIAGNOSIS — R296 Repeated falls: Secondary | ICD-10-CM | POA: Diagnosis not present

## 2022-08-08 DIAGNOSIS — R260 Ataxic gait: Secondary | ICD-10-CM | POA: Diagnosis not present

## 2022-08-08 DIAGNOSIS — G35 Multiple sclerosis: Secondary | ICD-10-CM | POA: Diagnosis not present

## 2022-08-08 DIAGNOSIS — S42211D Unspecified displaced fracture of surgical neck of right humerus, subsequent encounter for fracture with routine healing: Secondary | ICD-10-CM | POA: Diagnosis not present

## 2022-08-12 ENCOUNTER — Telehealth: Payer: Self-pay | Admitting: Internal Medicine

## 2022-08-12 ENCOUNTER — Encounter: Payer: Self-pay | Admitting: Internal Medicine

## 2022-08-12 ENCOUNTER — Telehealth (INDEPENDENT_AMBULATORY_CARE_PROVIDER_SITE_OTHER): Payer: Medicare Other | Admitting: Internal Medicine

## 2022-08-12 VITALS — Ht 63.0 in | Wt 99.2 lb

## 2022-08-12 DIAGNOSIS — J3800 Paralysis of vocal cords and larynx, unspecified: Secondary | ICD-10-CM

## 2022-08-12 DIAGNOSIS — R918 Other nonspecific abnormal finding of lung field: Secondary | ICD-10-CM | POA: Diagnosis not present

## 2022-08-12 DIAGNOSIS — R053 Chronic cough: Secondary | ICD-10-CM | POA: Diagnosis not present

## 2022-08-12 NOTE — Patient Instructions (Addendum)
ICD-10-CM   1. Chronic cough  R05.3     2. Vocal cord paralysis  J38.00     3. Pulmonary infiltrates  R91.8       Worsening left lower lobe infiltrates  Plan - Requires bronchoscopy with BAL in the next 2-6 weeks.  -Will need to be under general anesthesia.  -Will need risk assessment.  Followup  - 3-4 months to follow-up on bronchoscopy

## 2022-08-12 NOTE — H&P (View-Only) (Signed)
   [name] consult 10/25/20  72-year-old female with chronic cough.  She is a longstanding patient of Dr. Michael [name].  Since [id number] she has had multiple sclerosis.  She had a hip fracture in 2004 and after that she has used a walker.  In 2006 she had a EGD with 3 cm hiatal hernia.  She is always had a stridorous noise with deep inspiration.  She has a 15-year history of dysphagia.  2008 at esophagogram was normal.  In May 2022 she saw Dr. Dwight Bates and was noticed to have bilateral vocal fold paralysis.  She was admitted 09/27/2020 with history of fever but also chronic cough for 4 months.  She had failed outpatient antibiotics and steroids.  CT chest showed some scattered groundglass opacities [personally visualized] particularly in the left lower lobe.  She had a blood gas 09/29/2020 without any hypercapnia.  She had a ESR of 44.  She had normal procalcitonin.  She had serology that was negative in July 2022.  During this time MRSA PCR, COVID and flu were negative.  Respiratory virus panel was also negative.  Blood cultures were no growth.  The urine Streptococcus and Legionella was negative in July 2022.  She was discharged on 09/29/2020 with outpatient follow-up with GI and pulmonary.  No antibiotics or steroids were prescribed.   At follow-up she presented with Dr. Malcolm Stark 10/04/2020 and GI.  At this time she was already on Dexilant and Pepcid.  She underwent a swallow study on 10/15/2020 and it is unchanged compared to baseline.  He is able to swallow well without any aspiration using compensatory mechanisms.   She then presented via the ED on 10/23/2020 because of continued worsening of cough.  She also reports chronic bronchorrhea.  She coughs at least 10 times sputum and each time quite a bit.  It is clear in color.  Has never been measured in the sputum cup.  She complained of ongoing cough and ongoing acid reflux.  [GI endoscopy is pending at this time] she had a chest x-ray in the ED the  representative slice it is worsening compared to 1 month ago.  No CT scan available.  No leukocytosis no fever no hypoxemia.  Cough is worse when she lies flat.  EDP started her on a short course steroid orally.   She was then admitted by the hospitalist who is now consulted us.  She is frustrated by her continued symptoms.   Noted that she has baseline anemia.  Her hemoglobin in May 2022 was 11.9 g%.  But July dropped to 10.2 g%.  And currently 7.3 g%.  She has worsening anemia. Results for Michelin, Carena H (MRN [id number]) as of 10/24/2020 15:13   Ref. Range 09/27/2020 10:49 10/23/2020 13:28  Eosinophils Absolute Latest Ref Range: 0.0 - 0.5 K/uL 0.1 0.1   10/23/2020 -  8/3 CT chest - LLL consolidation and lingular infilktrare worse. Rt side infiltrates eter  8/4 - still with cough. D/w Dr Byers/BAters s - has mild insp stridor at rest due to bialteral VC paralysis. CT chest yesreday as above. PCT  < 0.1 ESR 80. Currently on pred 20mg per day   OV NP 08/15/21  08/15/2021 Follow up : Asthma and Pneumonia  Patient returns for 1 month follow-up.  Over the last year patient has had recurrent episodes of pneumonia and bronchitis.  She was admitted in July 2022 for community-acquired pneumonia.  She was treated with IV antibiotics.  CT chest September 27, 2020 showed groundglass opacities in the right lower lobe and left lower lobe concerning for multifocal pneumonia.  She was readmitted in August 2022.  CT chest showed groundglass attenuation and waxing and waning groundglass.  Autoimmune work-up was negative.  She underwent bronchoscopy with negative fungal and AFB.  BAL cultures were positive for rare Streptococcus constellatus.  Sed rate was elevated at 80.  Treated with Antibiotics and Steroids for possible recurrent CAP vs BOOP/COP. Cytology showed reactive bronchial cells and benign lung tissue.  She was referred to immunology.  Her Ocrevus therapy for MS was stopped due to recurrent infections.  She received 8  weeks of Rocephin.  And she is now on weekly IVIG per immunology.  Patient has recently been reestablished with a new neurologist and is undergoing new testing with MRI. Patient is on Trelegy inhaler for her underlying asthma.  Patient's main complaint is she has ongoing cough and congestion. Patient has longstanding chronic cough upper airway irritation.  She has been evaluated by ENT Dr. Bates.  She has known vocal fold paralysis, felt this is most likely due to her underlying MS.  She has intermittent stridor with this. Patient underwent modified barium swallow July 26 fifth 2022 that showed dysphagia in the pharyngeal phase.  No overt aspiration. Last visit patient was recommended on cough control regimen with Robitussin, Tessalon and flutter valve.  Patient says she has good and bad days.  Always has an ongoing cough that is quite productive at times.  We also talked about aspiration precautions. Last office visit . Sputum culture and AFB was not adequate  Sputum Fungal + heavy candida . and later  Mycocbaterium mucogenicum CT chest Jul 29, 2021 showed mild consolidation in the right upper lobe and patchy infiltrates in the mid to lower lung field on the left.  Frothy debris in the trachea and left mainstem bronchus questionable aspiration pneumonia.  She was treated with 1 week of Diflucan .  Patient denies any fever, discolored mucus.  Appetite is at baseline. We discussed aspiration precautions..   TEST/EVENTS :  CT chest September 27, 2020 showed groundglass opacities in the right lower lobe and left lower lobe concerning for multifocal pneumonia.  14 x 10 mm nodular density in the right middle lobe Modified barium swallow was negative   CT chest on August 3 showed groundglass attenuation consolidation with waxing and waning groundglass attenuation.  Autoimmune work-up was negative.  Fungal cultures were negative.  AFB sputum was negative.  BAL cultures showed rare Streptococcus Constellatus.   (Sensitive to ceftriaxone, levofloxacin and vancomycin.   intermediate to penicillin).  Sed rate was elevated at 80. Cytology showed reactive bronchial cells.  Benign lung tissue.    August 2022.  CT chest showed groundglass attenuation and waxing waning groundglass.  Autoimmune work-up was negative.  Fungal cultures AFB sputum was negative.  BAL cultures showed rare Streptococcus  Constellatus, sed rate was 80.  Cytology showed reactive bronchial cells and benign lung tissue.  Patient was referred to immunology   ENT consult 09/2015 -Bates - Vocal folds are immobile and in paramedian position. They are approximated such that voice comes out clearly but she has inspiratory stridor (ENT note - felt secondary most likely to MS    modified barium swallow July 26 fifth 2022 that showed dysphagia in the pharyngeal phase.  No overt aspiration.   CT chest Jul 29, 2021 mild apical scarring, mild consolidation in the medial aspect of the right upper lobe, patchy infiltrates in   the mid to lower left lung.   May 21, 2021 eosinophils 100   ID Consult June 2023  # Sputum cx positive for M mucogenicum/phocaicum # COPD, Cough Variant Asthma # Multiple sclerosis with associated B/L Vocal Cord Paralysis   09/28/20 respiratory cx normal respiratory flora 10/26/20 AFB smear and cx negative, strep constellatus , candida albicans ( tx with 8 weeks of IV ceftriaxone) 07/19/21 AFB smear and cx M mucogenicum/phocaicum, candida albicans ( tx 7 days of Fluconazole)   Does not meet criteria for Pulmonary NTM infection and hence, no indication to treat  OV 11/04/2021  Subjective:  Patient ID: Quisha H Moudy, female , DOB: 06/25/1950 , age 71 y.o. , MRN: [id number] , ADDRESS: [address] [place] [address] PCP Jones, Thomas L, MD Patient Care Team: Jones, Thomas L, MD as PCP - General (Internal Medicine) Douglas Jeffrey, MD (Neurology) Bates, Dwight, MD (Otolaryngology) Manandhar, Sabina, MD as  Consulting Physician (Infectious Diseases)  This Provider for this visit: Treatment Team:  Attending Provider: Nickey Kloepfer, MD    11/04/2021 -  Transfer of care from Dr [name] to Dr Trevonne Nyland Chief Complaint  Patient presents with   Follow-up    Pt states she is about the same since last visit. States she coughs all throughout the day and will cough up phlegm that ranges from white to yellow in color. States that she also has occasional wheezing.     HPI Haeley H Fleming 71 y.o. - she has bilateral vocal cord paralysius Follows with DR BAtes. DEcades ago has seen Dr Kouffman at [facility] who recommended adipose injection but she did not enjoy the interaction. Cough been going on for many years. Ws on gbapntin for neuropathy for many years and stopped this year but this did not make any difference in cough. She has had GI eval . She has hx of potential aspiration. She has hx of PNA. LAst CT May 2023. She had PFT - she says they told her id c/w asthma but in my view  it is cw vocal cord paralysis. She has not had allergy testing. Feno is normal.  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   For her MS she is off therapy. - - follows with doc at [place]   She says she is  immune suppresed and is on IvIG - Dr Frank Lichtenberger In [place]      OV 05/05/2022  Subjective:  Patient ID: Briella H Gonyer, female , DOB: 05/11/1950 , age 71 y.o. , MRN: [id number] , ADDRESS: [address] [place] [id number] PCP Jones, Thomas L, MD Patient Care Team: Jones, Thomas L, MD as PCP - General (Internal Medicine) Bates, Dwight, MD (Otolaryngology) Manandhar, Sabina, MD as Consulting Physician (Infectious Diseases) Carraro, Matthew, MD (Neurology) Lichtenberger, Frank J, MD (Allergy and Immunology)  This Provider for this visit: Treatment Team:  Attending Provider: Mera Gunkel, MD    05/05/2022 -   Chief Complaint  Patient presents with   Follow-up    Pt is  here for follow up for cough and COPD. Pt states that the cough is the same and that she is doing well. Pt is using trelelgy daily with albuterol as needed    Associated asthma and vocal cord paralysis.  HPI Aislee H Bernales 71 y.o. -presents for follow-up.  I last saw her in August 2023.  After that she saw a nurse practitioner.  I referred her to Dr. Stephen Carter right because of chronic cough and bilateral vocal cord/fold   paralysis.  I reviewed Dr. Wright's notes.  His assessment was known bilateral vocal fold immobility with small trans glottal airway and troublesome cough.  He recommended conservative line of treatment.  She states with his advised she is 25% better in terms of cough shortness of breath and dysphagia.  She says she is now learned to accept the residual 75% of symptoms will still be there.  She still has some chronic fatigue.  But overall no new issues.  She did have CT scan of the chest in October 2023 and I reviewed this result.  There are some debris in the left lower lobe.  She is willing to have a follow-up CT.      FENO  Lab Results  Component Value Date   NITRICOXIDE 14 11/04/2021      OV 08/12/2022  Subjective:  Patient ID: Jeweldean H Guglielmo, female , DOB: 05/20/1950 , age 72 y.o. , MRN: [id number] , ADDRESS: [address] [place] [phone] PCP Jones, Thomas L, MD Patient Care Team: Jones, Thomas L, MD as PCP - General (Internal Medicine) Bates, Dwight, MD (Otolaryngology) Manandhar, Sabina, MD as Consulting Physician (Infectious Diseases) Carraro, Matthew, MD (Neurology) Lichtenberger, Frank J, MD (Allergy and Immunology)  This Provider for this visit: Treatment Team:  Attending Provider: Meghan Tiemann, MD  Associated asthma and vocal cord paralysis.  08/12/2022 -   Chief Complaint  Patient presents with   Follow-up    F/up on CT   Type of visit: Video Virtual Visit Identification of patient Delani H Zurita with 08/23/1950 and MRN  [id number] - 2 person identifier Risks: Risks, benefits, limitations of telephone visit explained. Patient understood and verbalized agreement to proceed Anyone else on call: just patient Patient location: her home This provider location: [address], [address]; ; [place] [address].  Pulmonary Office. [phone]    HPI Aviannah H Barbee 72 y.o. -06/19/22 fell and fractures pelvis.  She is at home and recuperating.  No changes in her symptoms.  She did have a follow-up CT scan of the chest Aug 07, 2022.  I personally visualized this and showed it to her.  Compared to the fall 2023 this is now worse particularly in the left lower lobe with nodular densities.  She denies any aspiration.  She then is feeling sick.  I did indicate to her that she needs a bronchoscopy.  She is amenable to this.  She does have bilateral vocal cord paralysis.         Dr Kouffman Reflux Symptom Index (> 13- associated asthma and vocal cord paralysis.  15 suggestive of LPR cough)  11/04/2021   Hoarseness of problem with voice 3  Clearing  Of Throat 3  Excess throat mucus or feeling of post nasal drip 5  Difficulty swallowing food, liquid or tablets 5  Cough after eating or lying down 5  Breathing difficulties or choking episodes 5  Troublesome or annoying cough 5  Sensation of something sticking in throat or lump in throat 5  Heartburn, chest pain, indigestion, or stomach acid coming up -  TOTAL 36    CT chest 5?!624  arrative & Impression  CLINICAL DATA:  Pneumonia.   EXAM: CT CHEST WITHOUT CONTRAST   TECHNIQUE: Multidetector CT imaging of the chest was performed following the standard protocol without IV contrast.   RADIATION DOSE REDUCTION: This exam was performed according to the departmental dose-optimization program which includes automated exposure control, adjustment of the mA and/or kV according   to patient size and/or use of iterative reconstruction technique.    COMPARISON:  01/20/2022.   FINDINGS: Cardiovascular: Atherosclerotic calcification of the aorta. Heart size normal. No pericardial effusion.   Mediastinum/Nodes: 10 mm low-attenuation lesion in the left thyroid. No follow-up recommended. (Ref: J Am Coll Radiol. 2015 Feb;12(2): 143-50).No pathologically enlarged mediastinal or axillary lymph nodes. Hilar regions are difficult to definitively evaluate without IV contrast. Esophagus is grossly unremarkable.   Lungs/Pleura: Biapical pleuroparenchymal scarring. Cylindrical bronchiectasis. Worsening mucoid impaction and peribronchovascular nodularity in the lower lobes, left greater than right. Minimal involvement of the inferior lingula. No pleural fluid. Debris is seen in the airway.   Upper Abdomen: Probable scattered cysts in the liver. Visualized portions of the liver, adrenal glands, kidneys, spleen, pancreas, stomach and bowel are otherwise grossly unremarkable.   Musculoskeletal: Old sternal fracture. L1 superior endplate compression deformity, unchanged. No worrisome lytic or sclerotic lesions.   IMPRESSION: 1. Progressive mucoid impaction and peribronchovascular nodularity in the lower lobes, left greater than right, suggesting repeated episodes of aspiration. Atypical infectious etiology is not excluded. 2. Cylindrical bronchiectasis. 3.  Aortic atherosclerosis (ICD10-I70.0).     Electronically Signed   By: Melinda  Blietz M.D.   On: 08/11/2022 11:14    Latest Reference Range & Units 09/20/07 11:28 09/07/09 16:00 10/04/09 11:03 06/25/10 12:50 07/29/12 15:23 10/20/12 10:24 02/23/13 20:45 02/24/13 05:16 02/25/13 04:00 02/25/13 17:17 02/26/13 03:40 02/27/13 03:50 02/28/13 06:30 03/01/13 05:45 03/02/13 04:03 03/03/13 05:30 03/04/13 04:45 03/05/13 03:51 03/06/13 05:21 03/07/13 05:17 03/08/13 05:00 03/09/13 04:50 03/10/13 08:00 03/16/13 05:30 03/28/13 09:30 11/04/13 11:02 07/17/14 15:16 08/08/15 16:22 09/11/15 14:00 09/19/15  04:18 09/20/15 04:18 09/24/15 13:19 11/06/15 09:44 11/10/15 14:48 11/07/16 23:59 01/07/17 16:17 05/14/17 09:56 06/05/17 16:45 06/06/17 05:51 06/07/17 05:35 06/08/17 07:04 06/09/17 05:49 08/26/18 09:55 07/26/20 09:42 09/27/20 10:49 09/27/20 15:34 09/28/20 05:12 10/23/20 13:28 10/24/20 05:04 12/19/20 14:24 02/18/21 15:34 05/21/21 09:18 11/27/21 11:01 01/10/22 14:11 05/26/22 10:34 06/19/22 19:21 06/20/22 01:22 06/24/22 07:32  Hemoglobin 12.0 - 15.0 g/dL 12.1 11.5 (L) 11.7 (L) 12.2 12.0 11.9 (L) 11.3 (L) 10.6 (L) 10.0 (L) 10.0 (L) 11.0 (L) 10.0 (L) 9.1 (L) 9.6 (L) 8.2 (L) 7.8 (L) 7.6 (L) 7.7 (L) 7.4 (L) 8.1 (L) 7.4 (L) 7.6 (L) 10.8 (L) 9.1 (L) 9.4 (L) 12.4 11.5 (L) 12.4 12.3 10.7 (L) 10.0 (L) 10.0 (L) 12.3 11.9 (L) 11.5 (L) 10.9 (L) 11.7 (L) 10.7 (L) 10.2 (L) 9.5 (L) 8.8 (L) 8.8 (L) 12.0 11.9 (L) 10.5 (L) 10.2 (L) 9.7 (L) 9.7 (L) 9.3 (L) 11.1 (L) 12.4 12.5 13.5 12.4 11.8 (L) 11.0 (L) 11.1 (L) 11.1 (L)  (L): Data is abnormally low PFT     Latest Ref Rng & Units 10/24/2021    3:48 PM  PFT Results  FVC-Pre L 2.17   FVC-Predicted Pre % 74   FVC-Post L 2.22   FVC-Predicted Post % 76   Pre FEV1/FVC % % 50   Post FEV1/FCV % % 71   FEV1-Pre L 1.09   FEV1-Predicted Pre % 49   FEV1-Post L 1.59   DLCO uncorrected ml/min/mmHg 11.43   DLCO UNC% % 60   DLCO corrected ml/min/mmHg 11.43   DLCO COR %Predicted % 60   DLVA Predicted % 83   TLC L 4.92   TLC % Predicted % 100   RV % Predicted % 135        has a past medical history of Arthritis, Cervical cancer (HCC) (2005), COPD (chronic obstructive pulmonary disease) (HCC), Multiple sclerosis (HCC), Multiple sclerosis (  HCC) (1996), Neuromuscular disorder (HCC), Osteoporosis, Other diseases of vocal cords, Other voice and resonance disorders, Pneumonia (2014, 1992), Sleep apnea, and Unspecified hypothyroidism.   reports that she has never smoked. She has never used smokeless tobacco.  Past Surgical History:  Procedure Laterality Date   ABDOMINAL  HYSTERECTOMY  03/25/2003   for cervical cancer, complete   BRONCHIAL BIOPSY  10/26/2020   Procedure: BRONCHIAL BIOPSIES;  Surgeon: Icard, Bradley L, DO;  Location: [facility];  Service: Pulmonary;;   BRONCHIAL BRUSHINGS  10/26/2020   Procedure: BRONCHIAL BRUSHINGS;  Surgeon: Icard, Bradley L, DO;  Location: [facility];  Service: Pulmonary;;   BRONCHIAL WASHINGS  10/26/2020   Procedure: BRONCHIAL WASHINGS;  Surgeon: Icard, Bradley L, DO;  Location: [facility];  Service: Pulmonary;;   CATARACT EXTRACTION, BILATERAL Bilateral    lens for cataracts   CONVERSION TO TOTAL HIP Right 09/18/2015   Procedure: CONVERSION OF RIGHT HEMI ARTHROPLASTY TO TOTAL HIP ARTHROPLASTY ACETABULAR REVISON ;  Surgeon: Matthew Olin, MD;  Location: [facility];  Service: Orthopedics;  Laterality: Right;   HIP ARTHROPLASTY Right 03/01/2013   Procedure: ARTHROPLASTY BIPOLAR HIP;  Surgeon: Matthew D Olin, MD;  Location: [facility];  Service: Orthopedics;  Laterality: Right;   HIP CLOSED REDUCTION Right 03/12/2013   Procedure: CLOSED REDUCTION HIP;  Surgeon: Frank V Aluisio, MD;  Location: [facility];  Service: Orthopedics;  Laterality: Right;   HIP CLOSED REDUCTION Right 03/19/2013   Procedure: CLOSED REDUCTION HIP;  Surgeon: Jeffrey C Beane, MD;  Location: [facility];  Service: Orthopedics;  Laterality: Right;   HIP CLOSED REDUCTION Right 11/10/2015   Procedure: CLOSED MANIPULATION HIP;  Surgeon: Brian Swinteck, MD;  Location: [facility];  Service: Orthopedics;  Laterality: Right;   HIP CLOSED REDUCTION Right 06/05/2017   Procedure: CLOSED REDUCTION HIP;  Surgeon: Gioffre, Ronald, MD;  Location: [facility];  Service: Orthopedics;  Laterality: Right;   SMALL INTESTINE SURGERY     TOTAL HIP REVISION Right 06/06/2017   Procedure: TOTAL HIP REVISION;  Surgeon: Gioffre, Ronald, MD;  Location: [facility];  Service: Orthopedics;  Laterality: Right;   VESICOVAGINAL FISTULA CLOSURE W/ TAH  03/25/2003   VIDEO BRONCHOSCOPY N/A 10/26/2020   Procedure:  VIDEO BRONCHOSCOPY WITH FLUORO;  Surgeon: Icard, Bradley L, DO;  Location: [facility];  Service: Pulmonary;  Laterality: N/A;    Allergies  Allergen Reactions   Tramadol     hallucinations    Contrast Media [Iodinated Contrast Media] Rash   Iohexol Hives   Morphine And Codeine Other (See Comments)    Reaction:  Hallucinations    Methylprednisolone Other (See Comments)    Reaction:  Decreases pts heart rate    Hydrocodone Itching   Oxycodone-Acetaminophen Rash and Other (See Comments)    Reaction:  Hallucinations    Phenytoin Sodium Extended Rash   Zanaflex [Tizanidine Hydrochloride] Rash    Immunization History  Administered Date(s) Administered   DTP 04/10/2009   Fluad Quad(high Dose 65+) 12/29/2018, 01/29/2022   Influenza Split 12/10/2010, 02/11/2012, 12/22/2012, 01/02/2014, 12/22/2014   Influenza Whole 02/19/2005, 03/28/2009, 01/23/2016   Influenza, High Dose Seasonal PF 02/23/2018   Influenza,inj,Quad PF,6+ Mos 02/10/2017   Influenza-Unspecified 12/02/2018, 01/08/2019, 12/22/2019, 01/15/2021, 02/05/2021   Moderna Covid-19 Vaccine Bivalent Booster 18yrs & up 02/05/2021, 01/20/2022   Moderna Sars-Covid-2 Vaccination 04/29/2019, 06/01/2019, 01/19/2020, 08/14/2020   Pneumococcal Conjugate-13 11/06/2015   Pneumococcal Polysaccharide-23 03/28/2009, 04/26/2019   Respiratory Syncytial Virus Vaccine,Recomb Aduvanted(Arexvy) 12/26/2021   Tdap 04/26/2019, 06/19/2022   Zoster Recombinat (Shingrix) 09/07/2019, 02/17/2020      Family History  Problem Relation Age of Onset   Other Mother        COVID   Colon cancer Father    Atrial fibrillation Brother    Heart attack Maternal Grandfather    Parkinson's disease Paternal Grandfather    Esophageal cancer Neg Hx    Stomach cancer Neg Hx    Rectal cancer Neg Hx      Current Outpatient Medications:    albuterol (VENTOLIN HFA) 108 (90 Base) MCG/ACT inhaler, INHALE 2 PUFFS INTO LUNGS EVERY 6 HOURS AS NEEDED. (Patient taking  differently: Inhale 2 puffs into the lungs every 6 (six) hours as needed for wheezing or shortness of breath.), Disp: 18 g, Rfl: 0   baclofen (LIORESAL) 20 MG tablet, Take 20 mg by mouth in the morning., Disp: , Rfl:    calcium carbonate (OS-CAL - DOSED IN MG OF ELEMENTAL CALCIUM) 1250 (500 Ca) MG tablet, Take 1 tablet (1,250 mg total) by mouth daily with breakfast., Disp: , Rfl:    capsaicin (ZOSTRIX) 0.025 % cream, Apply topically every 6 (six) hours as needed (painful hip)., Disp: 60 g, Rfl: 0   dexlansoprazole (DEXILANT) 60 MG capsule, TAKE 1 CAPSULE BY MOUTH  ONCE DAILY WITH BREAKFAST, Disp: 90 capsule, Rfl: 0   docusate sodium (COLACE) 100 MG capsule, Take 1 capsule (100 mg total) by mouth daily as needed for mild constipation., Disp: 10 capsule, Rfl: 0   dronabinol (MARINOL) 2.5 MG capsule, Take 1 capsule (2.5 mg total) by mouth 2 (two) times daily before lunch and supper., Disp: 60 capsule, Rfl: 0   famotidine (PEPCID) 20 MG tablet, One after supper (Patient taking differently: Take 20 mg by mouth at bedtime. One after supper), Disp: 30 tablet, Rfl: 11   Ferric Maltol (ACCRUFER) 30 MG CAPS, Take 1 capsule (30 mg total) by mouth in the morning and at bedtime., Disp: 180 capsule, Rfl: 0   FLUoxetine (PROZAC) 40 MG capsule, Take 1 capsule (40 mg total) by mouth daily., Disp: 90 capsule, Rfl: 1   Fluticasone-Umeclidin-Vilant (TRELEGY ELLIPTA) 100-62.5-25 MCG/ACT AEPB, Inhale 1 puff into the lungs daily., Disp: 120 each, Rfl: 1   levothyroxine (SYNTHROID) 100 MCG tablet, TAKE 1 TABLET BY MOUTH ONCE  DAILY BEFORE BREAKFAST, Disp: 90 tablet, Rfl: 3   methenamine (HIPREX) 1 g tablet, Take 1 tablet (1 g total) by mouth 2 (two) times daily with a meal., Disp: 180 tablet, Rfl: 0   mirabegron ER (MYRBETRIQ) 25 MG TB24 tablet, Take 1 tablet (25 mg total) by mouth every other day., Disp: 15 tablet, Rfl: 0   mirtazapine (REMERON) 7.5 MG tablet, Take 1 tablet (7.5 mg total) by mouth at bedtime., Disp: 30  tablet, Rfl: 0   Multiple Vitamin (MULTIVITAMIN WITH MINERALS) TABS tablet, Take 1 tablet by mouth daily., Disp: , Rfl:    Probiotic Product (PROBIOTIC PO), Take 1 capsule by mouth daily. Harmony probiotic, Disp: , Rfl:    Respiratory Therapy Supplies (FLUTTER) DEVI, Use as directed, Disp: 1 each, Rfl: 0   sodium chloride HYPERTONIC 3 % nebulizer solution, Take by nebulization 2 (two) times daily as needed for cough., Disp: 750 mL, Rfl: 1   tamsulosin (FLOMAX) 0.4 MG CAPS capsule, Take 0.4 mg by mouth daily., Disp: , Rfl:    temazepam (RESTORIL) 15 MG capsule, Take 1 capsule (15 mg total) by mouth at bedtime., Disp: 90 capsule, Rfl: 0   torsemide (DEMADEX) 10 MG tablet, Take 1 tablet (10 mg total) by mouth daily., Disp: 90   tablet, Rfl: 0   trimethoprim (TRIMPEX) 100 MG tablet, Take 100 mg by mouth daily., Disp: , Rfl:    amphetamine-dextroamphetamine (ADDERALL) 20 MG tablet, Take 1 tablet (20 mg total) by mouth daily. (Patient not taking: Reported on 08/12/2022), Disp: 30 tablet, Rfl: 0      Objective:   Vitals:   08/12/22 1640  Weight: 99 lb 3.2 oz (45 kg)  Height: 5' 3" (1.6 m)    Estimated body mass index is 17.57 kg/m as calculated from the following:   Height as of this encounter: 5' 3" (1.6 m).   Weight as of this encounter: 99 lb 3.2 oz (45 kg).  @WEIGHTCHANGE@  Filed Weights   08/12/22 1640  Weight: 99 lb 3.2 oz (45 kg)     Physical Exam   General: No distress. Look stable O2 at rest: no Cane present: no Sitting in wheel chair: no. Sitting in her bd Frail: yes Obese: no Neuro: Alert and Oriented x 3. GCS 15. Speech normal Psych: Pleasant HEENT: has her usual inspiratory sttridor       Assessment:       ICD-10-CM   1. Chronic cough  R05.3     2. Vocal cord paralysis  J38.00     3. Pulmonary infiltrates  R91.8          Plan:     Patient Instructions     ICD-10-CM   1. Chronic cough  R05.3     2. Vocal cord paralysis  J38.00     3. Pulmonary  infiltrates  R91.8       Worsening left lower lobe infiltrates  Plan - Requires bronchoscopy with BAL in the next 2-6 weeks.  -Will need to be under general anesthesia.  -Will need risk assessment.  Followup  - 3-4 months to follow-up on bronchoscopy    SIGNATURE    Dr. Rowyn Spilde, M.D., F.C.C.P,  Pulmonary and Critical Care Medicine Staff Physician, [facility] Director - Interstitial Lung Disease  Program  [facility] [place], [place], [address]  Pager: [phone], If no answer or between  15:00h - 7:00h: call [phone] Telephone: [phone]  6:55 PM 08/12/2022  

## 2022-08-12 NOTE — Progress Notes (Signed)
[name] consult 10/25/20  72 year old female with chronic cough.  She is a longstanding patient of Dr. Sandrea Hughs.  Since [id number] she has had multiple sclerosis.  She had a hip fracture in 2004 and after that she has used a walker.  In 2006 she had a EGD with 3 cm hiatal hernia.  She is always had a stridorous noise with deep inspiration.  She has a 15-year history of dysphagia.  2008 at esophagogram was normal.  In May 2022 she saw Dr. Christia Reading and was noticed to have bilateral vocal fold paralysis.  She was admitted 09/27/2020 with history of fever but also chronic cough for 4 months.  She had failed outpatient antibiotics and steroids.  CT chest showed some scattered groundglass opacities [personally visualized] particularly in the left lower lobe.  She had a blood gas 09/29/2020 without any hypercapnia.  She had a ESR of 44.  She had normal procalcitonin.  She had serology that was negative in July 2022.  During this time MRSA PCR, COVID and flu were negative.  Respiratory virus panel was also negative.  Blood cultures were no growth.  The urine Streptococcus and Legionella was negative in July 2022.  She was discharged on 09/29/2020 with outpatient follow-up with GI and pulmonary.  No antibiotics or steroids were prescribed.   At follow-up she presented with Dr. Claudette Head 10/04/2020 and GI.  At this time she was already on Dexilant and Pepcid.  She underwent a swallow study on 10/15/2020 and it is unchanged compared to baseline.  He is able to swallow well without any aspiration using compensatory mechanisms.   She then presented via the ED on 10/23/2020 because of continued worsening of cough.  She also reports chronic bronchorrhea.  She coughs at least 10 times sputum and each time quite a bit.  It is clear in color.  Has never been measured in the sputum cup.  She complained of ongoing cough and ongoing acid reflux.  [GI endoscopy is pending at this time] she had a chest x-ray in the ED the  representative slice it is worsening compared to 1 month ago.  No CT scan available.  No leukocytosis no fever no hypoxemia.  Cough is worse when she lies flat.  EDP started her on a short course steroid orally.   She was then admitted by the hospitalist who is now consulted [place].  She is frustrated by her continued symptoms.   Noted that she has baseline anemia.  Her hemoglobin in May 2022 was 11.9 g%.  But July dropped to 10.2 g%.  And currently 7.3 g%.  She has worsening anemia. Results for [name], [name] (MRN [id number]) as of 10/24/2020 15:13   Ref. Range 09/27/2020 10:49 10/23/2020 13:28  Eosinophils Absolute Latest Ref Range: 0.0 - 0.5 K/uL 0.1 0.1   10/23/2020 -  8/3 CT chest - LLL consolidation and lingular infilktrare worse. Rt side infiltrates eter  8/4 - still with cough. D/w Dr Dorette Grate s - has mild insp stridor at rest due to bialteral VC paralysis. CT chest yesreday as above. PCT  < 0.1 ESR 80. Currently on pred 20mg  per day   OV NP 08/15/21  08/15/2021 Follow up : Asthma and Pneumonia  Patient returns for 1 month follow-up.  Over the last year patient has had recurrent episodes of pneumonia and bronchitis.  She was admitted in July 2022 for community-acquired pneumonia.  She was treated with IV antibiotics.  CT chest September 27, 2020 showed groundglass opacities in the right lower lobe and left lower lobe concerning for multifocal pneumonia.  She was readmitted in August 2022.  CT chest showed groundglass attenuation and waxing and waning groundglass.  Autoimmune work-up was negative.  She underwent bronchoscopy with negative fungal and AFB.  BAL cultures were positive for rare Streptococcus constellatus.  Sed rate was elevated at 80.  Treated with Antibiotics and Steroids for possible recurrent CAP vs BOOP/COP. Cytology showed reactive bronchial cells and benign lung tissue.  She was referred to immunology.  Her Ocrevus therapy for MS was stopped due to recurrent infections.  She received 8  weeks of Rocephin.  And she is now on weekly IVIG per immunology.  Patient has recently been reestablished with a new neurologist and is undergoing new testing with MRI. Patient is on Trelegy inhaler for her underlying asthma.  Patient's main complaint is she has ongoing cough and congestion. Patient has longstanding chronic cough upper airway irritation.  She has been evaluated by ENT Dr. [name].  She has known vocal fold paralysis, felt this is most likely due to her underlying MS.  She has intermittent stridor with this. Patient underwent modified barium swallow July 26 fifth 2022 that showed dysphagia in the pharyngeal phase.  No overt aspiration. Last visit patient was recommended on cough control regimen with Robitussin, Tessalon and flutter valve.  Patient says she has good and bad days.  Always has an ongoing cough that is quite productive at times.  We also talked about aspiration precautions. Last office visit . Sputum culture and AFB was not adequate  Sputum Fungal + heavy candida . and later  Mycocbaterium mucogenicum CT chest Jul 29, 2021 showed mild consolidation in the right upper lobe and patchy infiltrates in the mid to lower lung field on the left.  Frothy debris in the trachea and left mainstem bronchus questionable aspiration pneumonia.  She was treated with 1 week of Diflucan .  Patient denies any fever, discolored mucus.  Appetite is at baseline. We discussed aspiration precautions..   TEST/EVENTS :  CT chest September 27, 2020 showed groundglass opacities in the right lower lobe and left lower lobe concerning for multifocal pneumonia.  14 x 10 mm nodular density in the right middle lobe Modified barium swallow was negative   CT chest on August 3 showed groundglass attenuation consolidation with waxing and waning groundglass attenuation.  Autoimmune work-up was negative.  Fungal cultures were negative.  AFB sputum was negative.  BAL cultures showed rare Streptococcus Constellatus.   (Sensitive to ceftriaxone, levofloxacin and vancomycin.   intermediate to penicillin).  Sed rate was elevated at 80. Cytology showed reactive bronchial cells.  Benign lung tissue.    August 2022.  CT chest showed groundglass attenuation and waxing waning groundglass.  Autoimmune work-up was negative.  Fungal cultures AFB sputum was negative.  BAL cultures showed rare Streptococcus  Constellatus, sed rate was 80.  Cytology showed reactive bronchial cells and benign lung tissue.  Patient was referred to immunology   ENT consult 09/2015 -[name] - Vocal folds are immobile and in paramedian position. They are approximated such that voice comes out clearly but she has inspiratory stridor (ENT note - felt secondary most likely to MS    modified barium swallow July 26 fifth 2022 that showed dysphagia in the pharyngeal phase.  No overt aspiration.   CT chest Jul 29, 2021 mild apical scarring, mild consolidation in the medial aspect of the right upper lobe, patchy infiltrates in  the mid to lower left lung.   May 21, 2021 eosinophils 100   ID Consult June 2023  # Sputum cx positive for M mucogenicum/phocaicum # COPD, Cough Variant Asthma # Multiple sclerosis with associated B/L Vocal Cord Paralysis   09/28/20 respiratory cx normal respiratory flora 10/26/20 AFB smear and cx negative, strep constellatus , candida albicans ( tx with 8 weeks of IV ceftriaxone) 07/19/21 AFB smear and cx M mucogenicum/phocaicum, candida albicans ( tx 7 days of Fluconazole)   Does not meet criteria for Pulmonary NTM infection and hence, no indication to treat  OV 11/04/2021  Subjective:  Patient ID: Kayla [name], female , DOB: November 07, 1950 , age 66 y.o. , MRN: [id number] , ADDRESS: [address] [place] [address] PCP [name], MD Patient Care Team: [name], MD as PCP - General (Internal Medicine) Harriette Bouillon, MD (Neurology) Christia Reading, MD (Otolaryngology) Odette Fraction, MD as  Consulting Physician (Infectious Diseases)  This Provider for this visit: Treatment Team:  Attending Provider: Kalman Shan, MD    11/04/2021 -  Transfer of care from Dr Sherene Sires to Dr Marchelle Gearing Chief Complaint  Patient presents with   Follow-up    Pt states she is about the same since last visit. States she coughs all throughout the day and will cough up phlegm that ranges from white to yellow in color. States that she also has occasional wheezing.     HPI FRANCILE PEKALA 72 y.o. - she has bilateral vocal cord paralysius Follows with DR [name]. DEcades ago has seen Dr Gretta Cool at [facility] who recommended adipose injection but she did not enjoy the interaction. Cough been going on for many years. Ws on gbapntin for neuropathy for many years and stopped this year but this did not make any difference in cough. She has had GI eval . She has hx of potential aspiration. She has hx of PNA. LAst CT May 2023. She had PFT - she says they told her id c/w asthma but in my view  it is cw vocal cord paralysis. She has not had allergy testing. Feno is normal.  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   For her MS she is off therapy. - - follows with doc at [facility]   She says she is  immune suppresed and is on IvIG - Dr Loralyn Freshwater In [place]      OV 05/05/2022  Subjective:  Patient ID: Kayla [name], female , DOB: 07-03-1950 , age 79 y.o. , MRN: [id number] , ADDRESS: [address] [place] [address] PCP [name], MD Patient Care Team: [name], MD as PCP - General (Internal Medicine) Christia Reading, MD (Otolaryngology) Odette Fraction, MD as Consulting Physician (Infectious Diseases) Tyron Russell, MD (Neurology) Livingston Diones Feliberto Gottron, MD (Allergy and Immunology)  This Provider for this visit: Treatment Team:  Attending Provider: Kalman Shan, MD    05/05/2022 -   Chief Complaint  Patient presents with   Follow-up    Pt is  here for follow up for cough and COPD. Pt states that the cough is the same and that she is doing well. Pt is using trelelgy daily with albuterol as needed    Associated asthma and vocal cord paralysis.  HPI Kayla [name] 72 y.o. -presents for follow-up.  I last saw her in August 2023.  After that she saw a nurse practitioner.  I referred her to Dr. Providence Crosby right because of chronic cough and bilateral vocal cord/fold  paralysis.  I reviewed Dr. Lynelle Doctor notes.  His assessment was known bilateral vocal fold immobility with small trans glottal airway and troublesome cough.  He recommended conservative line of treatment.  She states with his advised she is 25% better in terms of cough shortness of breath and dysphagia.  She says she is now learned to accept the residual 75% of symptoms will still be there.  She still has some chronic fatigue.  But overall no new issues.  She did have CT scan of the chest in October 2023 and I reviewed this result.  There are some debris in the left lower lobe.  She is willing to have a follow-up CT.      FENO  Lab Results  Component Value Date   NITRICOXIDE 14 11/04/2021      OV 08/12/2022  Subjective:  Patient ID: Kayla [name], female , DOB: [birth year] , age 67 y.o. , MRN: [id number] , ADDRESS: [address] [place] [address] PCP [name], MD Patient Care Team: [name], MD as PCP - General (Internal Medicine) Christia Reading, MD (Otolaryngology) Odette Fraction, MD as Consulting Physician (Infectious Diseases) Tyron Russell, MD (Neurology) Livingston Diones Feliberto Gottron, MD (Allergy and Immunology)  This Provider for this visit: Treatment Team:  Attending Provider: Kalman Shan, MD  Associated asthma and vocal cord paralysis.  08/12/2022 -   Chief Complaint  Patient presents with   Follow-up    F/up on CT   Type of visit: Video Virtual Visit Identification of patient SAIDE KASKA with 1951/03/22 and MRN  [id number] - 2 person identifier Risks: Risks, benefits, limitations of telephone visit explained. Patient understood and verbalized agreement to proceed Anyone else on call: just patient Patient location: her home This provider location: [address]; [place]; [place] [address]. [facility]. [phone]    HPI Kayla [name] 72 y.o. -06/19/22 fell and fractures pelvis.  She is at home and recuperating.  No changes in her symptoms.  She did have a follow-up CT scan of the chest Aug 07, 2022.  I personally visualized this and showed it to her.  Compared to the fall 2023 this is now worse particularly in the left lower lobe with nodular densities.  She denies any aspiration.  She then is feeling sick.  I did indicate to her that she needs a bronchoscopy.  She is amenable to this.  She does have bilateral vocal cord paralysis.         Dr Gretta Cool Reflux Symptom Index (> 13- associated asthma and vocal cord paralysis.  15 suggestive of LPR cough)  11/04/2021   Hoarseness of problem with voice 3  Clearing  Of Throat 3  Excess throat mucus or feeling of post nasal drip 5  Difficulty swallowing food, liquid or tablets 5  Cough after eating or lying down 5  Breathing difficulties or choking episodes 5  Troublesome or annoying cough 5  Sensation of something sticking in throat or lump in throat 5  Heartburn, chest pain, indigestion, or stomach acid coming up -  TOTAL 36    CT chest 5?!624  arrative & Impression  CLINICAL DATA:  Pneumonia.   EXAM: CT CHEST WITHOUT CONTRAST   TECHNIQUE: Multidetector CT imaging of the chest was performed following the standard protocol without IV contrast.   RADIATION DOSE REDUCTION: This exam was performed according to the departmental dose-optimization program which includes automated exposure control, adjustment of the mA and/or kV according  to patient size and/or use of iterative reconstruction technique.    COMPARISON:  01/20/2022.   FINDINGS: Cardiovascular: Atherosclerotic calcification of the aorta. Heart size normal. No pericardial effusion.   Mediastinum/Nodes: 10 mm low-attenuation lesion in the left thyroid. No follow-up recommended. (Ref: J Am Coll Radiol. 2015 Feb;12(2): 143-50).No pathologically enlarged mediastinal or axillary lymph nodes. Hilar regions are difficult to definitively evaluate without IV contrast. Esophagus is grossly unremarkable.   Lungs/Pleura: Biapical pleuroparenchymal scarring. Cylindrical bronchiectasis. Worsening mucoid impaction and peribronchovascular nodularity in the lower lobes, left greater than right. Minimal involvement of the inferior lingula. No pleural fluid. Debris is seen in the airway.   Upper Abdomen: Probable scattered cysts in the liver. Visualized portions of the liver, adrenal glands, kidneys, spleen, pancreas, stomach and bowel are otherwise grossly unremarkable.   Musculoskeletal: Old sternal fracture. L1 superior endplate compression deformity, unchanged. No worrisome lytic or sclerotic lesions.   IMPRESSION: 1. Progressive mucoid impaction and peribronchovascular nodularity in the lower lobes, left greater than right, suggesting repeated episodes of aspiration. Atypical infectious etiology is not excluded. 2. Cylindrical bronchiectasis. 3.  Aortic atherosclerosis (ICD10-I70.0).     Electronically Signed   By: Leanna Battles M.D.   On: 08/11/2022 11:14    Latest Reference Range & Units 09/20/07 11:28 09/07/09 16:00 10/04/09 11:03 06/25/10 12:50 07/29/12 15:23 10/20/12 10:24 02/23/13 20:45 02/24/13 05:16 02/25/13 04:00 02/25/13 17:17 02/26/13 03:40 02/27/13 03:50 02/28/13 06:30 03/01/13 05:45 03/02/13 04:03 03/03/13 05:30 03/04/13 04:45 03/05/13 03:51 03/06/13 05:21 03/07/13 05:17 03/08/13 05:00 03/09/13 04:50 03/10/13 08:00 03/16/13 05:30 03/28/13 09:30 11/04/13 11:02 07/17/14 15:16 08/08/15 16:22 09/11/15 14:00 09/19/15  04:18 09/20/15 04:18 09/24/15 13:19 11/06/15 09:44 11/10/15 14:48 11/07/16 23:59 01/07/17 16:17 05/14/17 09:56 06/05/17 16:45 06/06/17 05:51 06/07/17 05:35 06/08/17 07:04 06/09/17 05:49 08/26/18 09:55 07/26/20 09:42 09/27/20 10:49 09/27/20 15:34 09/28/20 05:12 10/23/20 13:28 10/24/20 05:04 12/19/20 14:24 02/18/21 15:34 05/21/21 09:18 11/27/21 11:01 01/10/22 14:11 05/26/22 10:34 06/19/22 19:21 06/20/22 01:22 06/24/22 07:32  Hemoglobin 12.0 - 15.0 g/dL 46.9 62.9 (L) 52.8 (L) 12.2 12.0 11.9 (L) 11.3 (L) 10.6 (L) 10.0 (L) 10.0 (L) 11.0 (L) 10.0 (L) 9.1 (L) 9.6 (L) 8.2 (L) 7.8 (L) 7.6 (L) 7.7 (L) 7.4 (L) 8.1 (L) 7.4 (L) 7.6 (L) 10.8 (L) 9.1 (L) 9.4 (L) 12.4 11.5 (L) 12.4 12.3 10.7 (L) 10.0 (L) 10.0 (L) 12.3 11.9 (L) 11.5 (L) 10.9 (L) 11.7 (L) 10.7 (L) 10.2 (L) 9.5 (L) 8.8 (L) 8.8 (L) 12.0 11.9 (L) 10.5 (L) 10.2 (L) 9.7 (L) 9.7 (L) 9.3 (L) 11.1 (L) 12.4 12.5 13.5 12.4 11.8 (L) 11.0 (L) 11.1 (L) 11.1 (L)  (L): Data is abnormally low PFT     Latest Ref Rng & Units 10/24/2021    3:48 PM  PFT Results  FVC-Pre L 2.17   FVC-Predicted Pre % 74   FVC-Post L 2.22   FVC-Predicted Post % 76   Pre FEV1/FVC % % 50   Post FEV1/FCV % % 71   FEV1-Pre L 1.09   FEV1-Predicted Pre % 49   FEV1-Post L 1.59   DLCO uncorrected ml/min/mmHg 11.43   DLCO UNC% % 60   DLCO corrected ml/min/mmHg 11.43   DLCO COR %Predicted % 60   DLVA Predicted % 83   TLC L 4.92   TLC % Predicted % 100   RV % Predicted % 135        has a past medical history of Arthritis, Cervical cancer (HCC) (2005), COPD (chronic obstructive pulmonary disease) (HCC), Multiple sclerosis (HCC), Multiple sclerosis (  HCC) (1996), Neuromuscular disorder (HCC), Osteoporosis, Other diseases of vocal cords, Other voice and resonance disorders, Pneumonia (2014, 1992), Sleep apnea, and Unspecified hypothyroidism.   reports that she has never smoked. She has never used smokeless tobacco.  Past Surgical History:  Procedure Laterality Date   ABDOMINAL  HYSTERECTOMY  03/25/2003   for cervical cancer, complete   BRONCHIAL BIOPSY  10/26/2020   Procedure: BRONCHIAL BIOPSIES;  Surgeon: Josephine Igo, DO;  Location: [facility];  Service: Pulmonary;;   BRONCHIAL BRUSHINGS  10/26/2020   Procedure: BRONCHIAL BRUSHINGS;  Surgeon: Josephine Igo, DO;  Location: [facility];  Service: Pulmonary;;   BRONCHIAL WASHINGS  10/26/2020   Procedure: BRONCHIAL WASHINGS;  Surgeon: Josephine Igo, DO;  Location: [facility];  Service: Pulmonary;;   CATARACT EXTRACTION, BILATERAL Bilateral    lens for cataracts   CONVERSION TO TOTAL HIP Right 09/18/2015   Procedure: CONVERSION OF RIGHT HEMI ARTHROPLASTY TO TOTAL HIP ARTHROPLASTY ACETABULAR REVISON ;  Surgeon: Durene Romans, MD;  Location: [facility];  Service: Orthopedics;  Laterality: Right;   HIP ARTHROPLASTY Right 03/01/2013   Procedure: ARTHROPLASTY BIPOLAR HIP;  Surgeon: Shelda Pal, MD;  Location: [facility];  Service: Orthopedics;  Laterality: Right;   HIP CLOSED REDUCTION Right 03/12/2013   Procedure: CLOSED REDUCTION HIP;  Surgeon: [name], MD;  Location: [facility];  Service: Orthopedics;  Laterality: Right;   HIP CLOSED REDUCTION Right 03/19/2013   Procedure: CLOSED REDUCTION HIP;  Surgeon: Javier Docker, MD;  Location: [facility];  Service: Orthopedics;  Laterality: Right;   HIP CLOSED REDUCTION Right 11/10/2015   Procedure: CLOSED MANIPULATION HIP;  Surgeon: Samson Frederic, MD;  Location: [facility];  Service: Orthopedics;  Laterality: Right;   HIP CLOSED REDUCTION Right 06/05/2017   Procedure: CLOSED REDUCTION HIP;  Surgeon: Ranee Gosselin, MD;  Location: [facility];  Service: Orthopedics;  Laterality: Right;   SMALL INTESTINE SURGERY     TOTAL HIP REVISION Right 06/06/2017   Procedure: TOTAL HIP REVISION;  Surgeon: Ranee Gosselin, MD;  Location: [facility];  Service: Orthopedics;  Laterality: Right;   VESICOVAGINAL FISTULA CLOSURE W/ TAH  03/25/2003   VIDEO BRONCHOSCOPY N/A 10/26/2020   Procedure:  VIDEO BRONCHOSCOPY WITH FLUORO;  Surgeon: Josephine Igo, DO;  Location: [facility];  Service: Pulmonary;  Laterality: N/A;    Allergies  Allergen Reactions   Tramadol     hallucinations    Contrast Media [Iodinated Contrast Media] Rash   Iohexol Hives   Morphine And Codeine Other (See Comments)    Reaction:  Hallucinations    Methylprednisolone Other (See Comments)    Reaction:  Decreases pts heart rate    Hydrocodone Itching   Oxycodone-Acetaminophen Rash and Other (See Comments)    Reaction:  Hallucinations    Phenytoin Sodium Extended Rash   Zanaflex [Tizanidine Hydrochloride] Rash    Immunization History  Administered Date(s) Administered   DTP 04/10/2009   Fluad Quad(high Dose 65+) 12/29/2018, 01/29/2022   Influenza Split 12/10/2010, 02/11/2012, 12/22/2012, 01/02/2014, 12/22/2014   Influenza Whole 02/19/2005, 03/28/2009, 01/23/2016   Influenza, High Dose Seasonal PF 02/23/2018   Influenza,inj,Quad PF,6+ Mos 02/10/2017   Influenza-Unspecified 12/02/2018, 01/08/2019, 12/22/2019, 01/15/2021, 02/05/2021   Moderna Covid-19 Vaccine Bivalent Booster 34yrs & up 02/05/2021, 01/20/2022   Moderna Sars-Covid-2 Vaccination 04/29/2019, 06/01/2019, 01/19/2020, 08/14/2020   Pneumococcal Conjugate-13 11/06/2015   Pneumococcal Polysaccharide-23 03/28/2009, 04/26/2019   Respiratory Syncytial Virus Vaccine,Recomb Aduvanted(Arexvy) 12/26/2021   Tdap 04/26/2019, 06/19/2022   Zoster Recombinat (Shingrix) 09/07/2019, 02/17/2020  Family History  Problem Relation Age of Onset   Other Mother        COVID   Colon cancer Father    Atrial fibrillation Brother    Heart attack Maternal Grandfather    Parkinson's disease Paternal Grandfather    Esophageal cancer Neg Hx    Stomach cancer Neg Hx    Rectal cancer Neg Hx      Current Outpatient Medications:    albuterol (VENTOLIN HFA) 108 (90 Base) MCG/ACT inhaler, INHALE 2 PUFFS INTO LUNGS EVERY 6 HOURS AS NEEDED. (Patient taking  differently: Inhale 2 puffs into the lungs every 6 (six) hours as needed for wheezing or shortness of breath.), Disp: 18 g, Rfl: 0   baclofen (LIORESAL) 20 MG tablet, Take 20 mg by mouth in the morning., Disp: , Rfl:    calcium carbonate (OS-CAL - DOSED IN MG OF ELEMENTAL CALCIUM) 1250 (500 Ca) MG tablet, Take 1 tablet (1,250 mg total) by mouth daily with breakfast., Disp: , Rfl:    capsaicin (ZOSTRIX) 0.025 % cream, Apply topically every 6 (six) hours as needed (painful hip)., Disp: 60 g, Rfl: 0   dexlansoprazole (DEXILANT) 60 MG capsule, TAKE 1 CAPSULE BY MOUTH  ONCE DAILY WITH BREAKFAST, Disp: 90 capsule, Rfl: 0   docusate sodium (COLACE) 100 MG capsule, Take 1 capsule (100 mg total) by mouth daily as needed for mild constipation., Disp: 10 capsule, Rfl: 0   dronabinol (MARINOL) 2.5 MG capsule, Take 1 capsule (2.5 mg total) by mouth 2 (two) times daily before lunch and supper., Disp: 60 capsule, Rfl: 0   famotidine (PEPCID) 20 MG tablet, One after supper (Patient taking differently: Take 20 mg by mouth at bedtime. One after supper), Disp: 30 tablet, Rfl: 11   Ferric Maltol (ACCRUFER) 30 MG CAPS, Take 1 capsule (30 mg total) by mouth in the morning and at bedtime., Disp: 180 capsule, Rfl: 0   FLUoxetine (PROZAC) 40 MG capsule, Take 1 capsule (40 mg total) by mouth daily., Disp: 90 capsule, Rfl: 1   Fluticasone-Umeclidin-Vilant (TRELEGY ELLIPTA) 100-62.5-25 MCG/ACT AEPB, Inhale 1 puff into the lungs daily., Disp: 120 each, Rfl: 1   levothyroxine (SYNTHROID) 100 MCG tablet, TAKE 1 TABLET BY MOUTH ONCE  DAILY BEFORE BREAKFAST, Disp: 90 tablet, Rfl: 3   methenamine (HIPREX) 1 g tablet, Take 1 tablet (1 g total) by mouth 2 (two) times daily with a meal., Disp: 180 tablet, Rfl: 0   mirabegron ER (MYRBETRIQ) 25 MG TB24 tablet, Take 1 tablet (25 mg total) by mouth every other day., Disp: 15 tablet, Rfl: 0   mirtazapine (REMERON) 7.5 MG tablet, Take 1 tablet (7.5 mg total) by mouth at bedtime., Disp: 30  tablet, Rfl: 0   Multiple Vitamin (MULTIVITAMIN WITH MINERALS) TABS tablet, Take 1 tablet by mouth daily., Disp: , Rfl:    Probiotic Product (PROBIOTIC PO), Take 1 capsule by mouth daily. Harmony probiotic, Disp: , Rfl:    Respiratory Therapy Supplies (FLUTTER) DEVI, Use as directed, Disp: 1 each, Rfl: 0   sodium chloride HYPERTONIC 3 % nebulizer solution, Take by nebulization 2 (two) times daily as needed for cough., Disp: 750 mL, Rfl: 1   tamsulosin (FLOMAX) 0.4 MG CAPS capsule, Take 0.4 mg by mouth daily., Disp: , Rfl:    temazepam (RESTORIL) 15 MG capsule, Take 1 capsule (15 mg total) by mouth at bedtime., Disp: 90 capsule, Rfl: 0   torsemide (DEMADEX) 10 MG tablet, Take 1 tablet (10 mg total) by mouth daily., Disp: 90  tablet, Rfl: 0   trimethoprim (TRIMPEX) 100 MG tablet, Take 100 mg by mouth daily., Disp: , Rfl:    amphetamine-dextroamphetamine (ADDERALL) 20 MG tablet, Take 1 tablet (20 mg total) by mouth daily. (Patient not taking: Reported on 08/12/2022), Disp: 30 tablet, Rfl: 0      Objective:   Vitals:   08/12/22 1640  Weight: 99 lb 3.2 oz (45 kg)  Height: 5\' 3"  (1.6 m)    Estimated body mass index is 17.57 kg/m as calculated from the following:   Height as of this encounter: 5\' 3"  (1.6 m).   Weight as of this encounter: 99 lb 3.2 oz (45 kg).  @WEIGHTCHANGE @  [employer]   08/12/22 1640  Weight: 99 lb 3.2 oz (45 kg)     Physical Exam   General: No distress. Look stable O2 at rest: no Cane present: no Sitting in wheel chair: no. Sitting in her bd Frail: yes Obese: no Neuro: Alert and Oriented x 3. GCS 15. Speech normal Psych: Pleasant HEENT: has her usual inspiratory sttridor       Assessment:       ICD-10-CM   1. Chronic cough  R05.3     2. Vocal cord paralysis  J38.00     3. Pulmonary infiltrates  R91.8          Plan:     Patient Instructions     ICD-10-CM   1. Chronic cough  R05.3     2. Vocal cord paralysis  J38.00     3. Pulmonary  infiltrates  R91.8       Worsening left lower lobe infiltrates  Plan - Requires bronchoscopy with BAL in the next 2-6 weeks.  -Will need to be under general anesthesia.  -Will need risk assessment.  Followup  - 3-4 months to follow-up on bronchoscopy    SIGNATURE    Dr. Kalman Shan, M.D., F.C.C.P,  Pulmonary and Critical Care Medicine Staff Physician, [facility] Center Director - Interstitial Lung Disease  Program  Pulmonary Fibrosis [facility] at [facility] [place], [place], [address]  Pager: [phone], If no answer or between  15:00h - 7:00h: call [phone] Telephone: [phone]  6:55 PM 08/12/2022

## 2022-08-12 NOTE — Telephone Encounter (Signed)
Kayla [name]  She has multiple sclerosis.  She has bilateral vocal cord paralysis.  She has mild amount of dysphagia.  She has worsening left lower lobe infiltrates that are nodular..  Although she does not have much of symptoms.  Please take a look 2 years ago the bronchoscopy showed microbes.  The infiltrates to look nodular.  If you feel like the lavage alone would suffice that is fine.  On the other hand she might need transbronchial biopsies.  If you feel she needs that.  Please let me know and I can work with the schedulers and setting it up on your schedule   Thanks    SIGNATURE    Dr. Kalman Shan, M.D., F.C.C.P,  Pulmonary and Critical Care Medicine Staff Physician, [facility] Center Director - Interstitial Lung Disease  Program  Pulmonary Fibrosis [facility] Network at [facility] [place], [place], [address]   Pager: [phone], If no answer  -> Check AMION or Try [phone] Telephone (clinical office): [phone] Telephone (research): [phone]  6:57 PM 08/12/2022

## 2022-08-13 DIAGNOSIS — R296 Repeated falls: Secondary | ICD-10-CM | POA: Diagnosis not present

## 2022-08-13 DIAGNOSIS — G35 Multiple sclerosis: Secondary | ICD-10-CM | POA: Diagnosis not present

## 2022-08-13 DIAGNOSIS — R260 Ataxic gait: Secondary | ICD-10-CM | POA: Diagnosis not present

## 2022-08-13 DIAGNOSIS — S42211D Unspecified displaced fracture of surgical neck of right humerus, subsequent encounter for fracture with routine healing: Secondary | ICD-10-CM | POA: Diagnosis not present

## 2022-08-14 ENCOUNTER — Encounter: Payer: Self-pay | Admitting: Pulmonary Disease

## 2022-08-14 ENCOUNTER — Other Ambulatory Visit: Payer: Self-pay

## 2022-08-14 DIAGNOSIS — R911 Solitary pulmonary nodule: Secondary | ICD-10-CM

## 2022-08-14 NOTE — Telephone Encounter (Signed)
Pt has been scheduled for 5/28 at 10:45.  Case # [id number].  Pt will need to come to the office tomorrow to get her covid test.  Spoke to pt & gave her appt info and sent bronch letter thru Mychart.

## 2022-08-14 NOTE — Telephone Encounter (Signed)
Procedure pool  I will NOT be able to do case for some weeks. Dr Tonia Brooms can do Tuesday after memorial day . I am good with that and please let her know in trusted hands. However, if she wants to wait could be in few weeks then I can schedule

## 2022-08-15 ENCOUNTER — Other Ambulatory Visit: Payer: Self-pay

## 2022-08-15 ENCOUNTER — Other Ambulatory Visit: Payer: Medicare Other

## 2022-08-15 ENCOUNTER — Encounter (HOSPITAL_COMMUNITY): Payer: Self-pay | Admitting: Pulmonary Disease

## 2022-08-15 DIAGNOSIS — R911 Solitary pulmonary nodule: Secondary | ICD-10-CM | POA: Diagnosis not present

## 2022-08-15 DIAGNOSIS — R296 Repeated falls: Secondary | ICD-10-CM | POA: Diagnosis not present

## 2022-08-15 DIAGNOSIS — G35 Multiple sclerosis: Secondary | ICD-10-CM | POA: Diagnosis not present

## 2022-08-15 DIAGNOSIS — Z79899 Other long term (current) drug therapy: Secondary | ICD-10-CM | POA: Diagnosis not present

## 2022-08-15 DIAGNOSIS — D72819 Decreased white blood cell count, unspecified: Secondary | ICD-10-CM | POA: Diagnosis not present

## 2022-08-15 DIAGNOSIS — S42211D Unspecified displaced fracture of surgical neck of right humerus, subsequent encounter for fracture with routine healing: Secondary | ICD-10-CM | POA: Diagnosis not present

## 2022-08-15 DIAGNOSIS — R260 Ataxic gait: Secondary | ICD-10-CM | POA: Diagnosis not present

## 2022-08-15 NOTE — Progress Notes (Signed)
SDW call  Patient was given pre-op instructions over the phone. Patient verbalized understanding of instructions provided.    PCP - Dr. Sanda Linger Cardiologist -  Pulmonary: [facility]   PPM/ICD - Denies   Chest x-ray - 06/27/2022 EKG -  06/15/2022 Stress Test - ECHO - 01/28/2022 Cardiac Cath -   Sleep Study/sleep apnea/CPAP: Diagnosed with sleep apnea but does not wear a CPAP  Non-diabetic  Blood Thinner Instructions: Denies Aspirin Instructions:Denies   ERAS Protcol - No, NPO PRE-SURGERY Ensure or G2-    COVID TEST- 08/15/2022    Anesthesia review: No   Patient denies shortness of breath, fever, cough and chest pain over the phone call  Your procedure is scheduled on Tuesday Aug 19, 2022  Report to [facility] Main Entrance "A" at  0815  A.M., then check in with the Admitting office.  Call this number if you have problems the morning of surgery:  [phone]   If you have any questions prior to your surgery date call [phone]: Open Monday-Friday 8am-4pm If you experience any cold or flu symptoms such as cough, fever, chills, shortness of breath, etc. between now and your scheduled surgery, please notify us at the above number    Remember:  Do not eat or drink after midnight the night before your surgery   Take these medicines the morning of surgery with A SIP OF WATER:  Baclofen, fluoxetine, trelegy ellipta, levothyroxine  As needed: Albuterol, NaCL neb  As of today, STOP taking any Aspirin (unless otherwise instructed by your surgeon) Aleve, Naproxen, Ibuprofen, Motrin, Advil, Goody's, BC's, all herbal medications, fish oil, and all vitamins.

## 2022-08-19 ENCOUNTER — Encounter (HOSPITAL_COMMUNITY)
Admission: RE | Disposition: A | Discharge status: Home / Self Care | Payer: Self-pay | Source: Home / Self Care | Attending: Pulmonary Disease

## 2022-08-19 ENCOUNTER — Ambulatory Visit (HOSPITAL_BASED_OUTPATIENT_CLINIC_OR_DEPARTMENT_OTHER): Payer: Medicare Other | Admitting: Certified Registered"

## 2022-08-19 ENCOUNTER — Ambulatory Visit (HOSPITAL_COMMUNITY): Payer: Medicare Other | Admitting: Certified Registered"

## 2022-08-19 ENCOUNTER — Other Ambulatory Visit: Payer: Self-pay

## 2022-08-19 ENCOUNTER — Encounter (HOSPITAL_COMMUNITY): Payer: Self-pay | Admitting: Pulmonary Disease

## 2022-08-19 ENCOUNTER — Ambulatory Visit (HOSPITAL_COMMUNITY)
Admission: RE | Admit: 2022-08-19 | Discharge: 2022-08-19 | Disposition: A | Discharge status: Home / Self Care | Payer: Medicare Other | Attending: Pulmonary Disease | Admitting: Pulmonary Disease

## 2022-08-19 DIAGNOSIS — R0609 Other forms of dyspnea: Secondary | ICD-10-CM

## 2022-08-19 DIAGNOSIS — G35 Multiple sclerosis: Secondary | ICD-10-CM | POA: Insufficient documentation

## 2022-08-19 DIAGNOSIS — G4730 Sleep apnea, unspecified: Secondary | ICD-10-CM | POA: Insufficient documentation

## 2022-08-19 DIAGNOSIS — R918 Other nonspecific abnormal finding of lung field: Secondary | ICD-10-CM | POA: Insufficient documentation

## 2022-08-19 DIAGNOSIS — B953 Streptococcus pneumoniae as the cause of diseases classified elsewhere: Secondary | ICD-10-CM | POA: Insufficient documentation

## 2022-08-19 DIAGNOSIS — J449 Chronic obstructive pulmonary disease, unspecified: Secondary | ICD-10-CM | POA: Insufficient documentation

## 2022-08-19 DIAGNOSIS — J3800 Paralysis of vocal cords and larynx, unspecified: Secondary | ICD-10-CM | POA: Diagnosis not present

## 2022-08-19 DIAGNOSIS — J45909 Unspecified asthma, uncomplicated: Secondary | ICD-10-CM | POA: Diagnosis not present

## 2022-08-19 HISTORY — DX: Gastro-esophageal reflux disease without esophagitis: K21.9

## 2022-08-19 HISTORY — PX: BRONCHIAL WASHINGS: SHX5105

## 2022-08-19 HISTORY — PX: VIDEO BRONCHOSCOPY: SHX5072

## 2022-08-19 LAB — BODY FLUID CELL COUNT WITH DIFFERENTIAL
Eos, Fluid: 1 %
Eos, Fluid: 0 %
Lymphs, Fluid: 5 %
Lymphs, Fluid: 6 %
Monocyte-Macrophage-Serous Fluid: 2 % — ABNORMAL LOW (ref 50–90)
Monocyte-Macrophage-Serous Fluid: 2 % — ABNORMAL LOW (ref 50–90)
Neutrophil Count, Fluid: 92 % — ABNORMAL HIGH (ref 0–25)
Neutrophil Count, Fluid: 92 % — ABNORMAL HIGH (ref 0–25)
Other Cells, Fluid: NONE SEEN %
Total Nucleated Cell Count, Fluid: 23000 cu mm — ABNORMAL HIGH (ref 0–1000)
Total Nucleated Cell Count, Fluid: 5813 cu mm — ABNORMAL HIGH (ref 0–1000)

## 2022-08-19 LAB — CBC
HCT: 37.4 % (ref 36.0–46.0)
Hemoglobin: 11.8 g/dL — ABNORMAL LOW (ref 12.0–15.0)
MCH: 28.4 pg (ref 26.0–34.0)
MCHC: 31.6 g/dL (ref 30.0–36.0)
MCV: 89.9 fL (ref 80.0–100.0)
Platelets: 401 10*3/uL — ABNORMAL HIGH (ref 150–400)
RBC: 4.16 MIL/uL (ref 3.87–5.11)
RDW: 15 % (ref 11.5–15.5)
WBC: 29.2 10*3/uL — ABNORMAL HIGH (ref 4.0–10.5)
nRBC: 0 % (ref 0.0–0.2)

## 2022-08-19 LAB — CULTURE, BAL-QUANTITATIVE W GRAM STAIN

## 2022-08-19 LAB — AEROBIC/ANAEROBIC CULTURE W GRAM STAIN (SURGICAL/DEEP WOUND)

## 2022-08-19 LAB — BASIC METABOLIC PANEL
Anion gap: 12 (ref 5–15)
BUN: 17 mg/dL (ref 8–23)
CO2: 24 mmol/L (ref 22–32)
Calcium: 9.1 mg/dL (ref 8.9–10.3)
Chloride: 101 mmol/L (ref 98–111)
Creatinine, Ser: 0.84 mg/dL (ref 0.44–1.00)
GFR, Estimated: >60 mL/min (ref >60)
Glucose, Bld: 97 mg/dL (ref 70–99)
Potassium: 3.7 mmol/L (ref 3.5–5.1)
Sodium: 137 mmol/L (ref 135–145)

## 2022-08-19 LAB — NOVEL CORONAVIRUS, NAA: SARS-CoV-2, NAA: NOT DETECTED

## 2022-08-19 SURGERY — BRONCHOSCOPY, WITH FLUOROSCOPY
Anesthesia: General | Laterality: Left

## 2022-08-19 MED ORDER — ONDANSETRON HCL 4 MG/2ML IJ SOLN
INTRAMUSCULAR | Status: DC | PRN
  Administered 2022-08-19: 4 mg via INTRAVENOUS

## 2022-08-19 MED ORDER — IPRATROPIUM-ALBUTEROL 0.5-2.5 (3) MG/3ML IN SOLN
RESPIRATORY_TRACT | Status: AC
  Filled 2022-08-19: qty 3

## 2022-08-19 MED ORDER — AMOXICILLIN-POT CLAVULANATE 875-125 MG PO TABS: 1.0000 | ORAL_TABLET | Freq: Two times a day (BID) | ORAL | 0 refills | Status: AC

## 2022-08-19 MED ORDER — ONDANSETRON HCL 4 MG/2ML IJ SOLN: 4.0000 mg | Freq: Once | INTRAMUSCULAR | Status: DC | PRN

## 2022-08-19 MED ORDER — IPRATROPIUM-ALBUTEROL 0.5-2.5 (3) MG/3ML IN SOLN
3.0000 mL | Freq: Once | RESPIRATORY_TRACT | Status: AC
  Administered 2022-08-19: 3 mL via RESPIRATORY_TRACT

## 2022-08-19 MED ORDER — LACTATED RINGERS IV SOLN: INTRAVENOUS | Status: DC

## 2022-08-19 MED ORDER — PROPOFOL 500 MG/50ML IV EMUL
INTRAVENOUS | Status: DC | PRN
  Administered 2022-08-19: 125 ug/kg/min via INTRAVENOUS

## 2022-08-19 MED ORDER — CHLORHEXIDINE GLUCONATE 0.12 % MT SOLN
15.0000 mL | Freq: Once | OROMUCOSAL | Status: AC
  Administered 2022-08-19: 15 mL via OROMUCOSAL
  Filled 2022-08-19 (×2): qty 15

## 2022-08-19 MED ORDER — PROPOFOL 10 MG/ML IV BOLUS
INTRAVENOUS | Status: DC | PRN
  Administered 2022-08-19: 70 mg via INTRAVENOUS
  Administered 2022-08-19: 20 mg via INTRAVENOUS

## 2022-08-19 MED ORDER — SUGAMMADEX SODIUM 200 MG/2ML IV SOLN
INTRAVENOUS | Status: DC | PRN
  Administered 2022-08-19: 200 mg via INTRAVENOUS
  Administered 2022-08-19: 100 mg via INTRAVENOUS

## 2022-08-19 MED ORDER — PHENYLEPHRINE HCL-NACL 20-0.9 MG/250ML-% IV SOLN
INTRAVENOUS | Status: DC | PRN
  Administered 2022-08-19: 200 ug/min via INTRAVENOUS

## 2022-08-19 MED ORDER — ROCURONIUM BROMIDE 10 MG/ML (PF) SYRINGE
PREFILLED_SYRINGE | INTRAVENOUS | Status: DC | PRN
  Administered 2022-08-19: 50 mg via INTRAVENOUS

## 2022-08-19 MED ORDER — LIDOCAINE 2% (20 MG/ML) 5 ML SYRINGE
INTRAMUSCULAR | Status: DC | PRN
  Administered 2022-08-19: 20 mg via INTRAVENOUS
  Administered 2022-08-19: 50 mg via INTRAVENOUS

## 2022-08-19 NOTE — Progress Notes (Addendum)
Pt's WBC's 29.2 today. Called Dr. Tonia Brooms to notify him. Dr. Tonia Brooms aware of lab results and will proceed with procedure.

## 2022-08-19 NOTE — Anesthesia Procedure Notes (Signed)
Procedure Name: Intubation Date/Time: 08/19/2022 11:27 AM  Performed by: Stanton Kidney, CRNAPre-anesthesia Checklist: Patient identified, Emergency Drugs available, Suction available and Patient being monitored Patient Re-evaluated:Patient Re-evaluated prior to induction Oxygen Delivery Method: Circle system utilized Preoxygenation: Pre-oxygenation with 100% oxygen Induction Type: IV induction Ventilation: Mask ventilation without difficulty Laryngoscope Size: Miller and 3 Tube type: Oral Laser Tube: Cuffed inflated with minimal occlusive pressure - saline Tube size: 8.5 mm Number of attempts: 1 Airway Equipment and Method: Stylet and Oral airway Placement Confirmation: ETT inserted through vocal cords under direct vision, positive ETCO2 and breath sounds checked- equal and bilateral Secured at: 20 cm Tube secured with: Tape Dental Injury: Teeth and Oropharynx as per pre-operative assessment

## 2022-08-19 NOTE — Op Note (Signed)
Video Bronchoscopy Procedure Note  Date of Operation: 08/19/2022  Pre-op Diagnosis: Bilateral lower lobe infiltrates   Post-op Diagnosis: Bilateral lower lobe infiltrates   Surgeon: Josephine Igo, DO   Assistants: none  Anesthesia: general  Operation: Flexible video fiberoptic bronchoscopy and biopsies.  Estimated Blood Loss: 0 cc  Complications: none noted  Indications and History: [name] is 72 y.o. with history of recurrent pulmonary infiltrates.  Recommendation was to perform video fiberoptic bronchoscopy with biopsies. The risks, benefits, complications, treatment options and expected outcomes were discussed with the patient.  The possibilities of pneumothorax, pneumonia, reaction to medication, pulmonary aspiration, perforation of a viscus, bleeding, failure to diagnose a condition and creating a complication requiring transfusion or operation were discussed with the patient who freely signed the consent.    Description of Procedure: The patient was seen in the Preoperative Area, was examined and was deemed appropriate to proceed.  The patient was taken to [facility] endoscopy room 3, identified as [name] and the procedure verified as Flexible Video Fiberoptic Bronchoscopy.  A Time Out was held and the above information confirmed.   Conscious sedation was initiated as indicated above. The video fiberoptic bronchoscope was introduced via the L nare and a general inspection was performed which showed normal cords, normal trachea, normal main carina. The R sided airways were inspected and showed normal RUL, BI, RML and RLL. The L side was then inspected. The LLL, Lingular and LUL airways were normal.   Upon initial inspection there was thick yellow-tan secretions in the bilateral mainstem's.  This was suctioned clear and collected in a container to be sent for cultures as bronchial washings from the bilateral mainstem's.  We then converted our attention to the left  lower lobe and completed a BAL with moderate return and we did the same thing with a BAL to the right lower lobe for moderate return  Samples: 1.  Bronchial washings from bilateral mainstem's 2.  Bronchoalveolar lavage left lower lobe 3.  Bronchoalveolar lavage right lower lobe  Plans:  We will review the cytology, pathology and microbiology results with the patient when they become available.  Outpatient followup will be with Dr. Kerry Kass, DO [facility] 08/19/2022 11:59 AM

## 2022-08-19 NOTE — Progress Notes (Signed)
Patient's WBC's 29.2 today. Paged Dr. Maple Hudson.

## 2022-08-19 NOTE — Discharge Instructions (Signed)
Flexible Bronchoscopy, Care After This sheet gives you information about how to care for yourself after your test. Your doctor may also give you more specific instructions. If you have problems or questions, contact your doctor. Follow these instructions at home: Eating and drinking Do not eat or drink anything (not even water) for 2 hours after your test, or until your numbing medicine (local anesthetic) wears off. When your numbness is gone and your cough and gag reflexes have come back, you may: Eat only soft foods. Slowly drink liquids. The day after the test, go back to your normal diet. Driving Do not drive for 24 hours if you were given a medicine to help you relax (sedative). Do not drive or use heavy machinery while taking prescription pain medicine. General instructions  Take over-the-counter and prescription medicines only as told by your doctor. Return to your normal activities as told. Ask what activities are safe for you. Do not use any products that have nicotine or tobacco in them. This includes cigarettes and e-cigarettes. If you need help quitting, ask your doctor. Keep all follow-up visits as told by your doctor. This is important. It is very important if you had a tissue sample (biopsy) taken. Get help right away if: You have shortness of breath that gets worse. You get light-headed. You feel like you are going to pass out (faint). You have chest pain. You cough up: More than a little blood. More blood than before. Summary Do not eat or drink anything (not even water) for 2 hours after your test, or until your numbing medicine wears off. Do not use cigarettes. Do not use e-cigarettes. Get help right away if you have chest pain.  This information is not intended to replace advice given to you by your health care provider. Make sure you discuss any questions you have with your health care provider. Document Released: 01/05/2009 Document Revised: 02/20/2017 Document  Reviewed: 03/28/2016 Elsevier Patient Education  2020 Elsevier Inc.  

## 2022-08-19 NOTE — Anesthesia Preprocedure Evaluation (Signed)
Anesthesia Evaluation  Patient identified by MRN, date of birth, ID band Patient awake    Reviewed: Allergy & Precautions, H&P , NPO status , Patient's Chart, lab work & pertinent test results  Airway Mallampati: II  TM Distance: >3 FB Neck ROM: Full    Dental no notable dental hx.    Pulmonary sleep apnea , COPD   Pulmonary exam normal breath sounds clear to auscultation       Cardiovascular negative cardio ROS Normal cardiovascular exam Rhythm:Regular Rate:Normal     Neuro/Psych MS  Neuromuscular disease  negative psych ROS   GI/Hepatic negative GI ROS, Neg liver ROS,,,  Endo/Other  negative endocrine ROS    Renal/GU negative Renal ROS  negative genitourinary   Musculoskeletal negative musculoskeletal ROS (+)    Abdominal   Peds negative pediatric ROS (+)  Hematology negative hematology ROS (+)   Anesthesia Other Findings   Reproductive/Obstetrics negative OB ROS                             Anesthesia Physical Anesthesia Plan  ASA: 3  Anesthesia Plan: General   Post-op Pain Management: Minimal or no pain anticipated   Induction: Intravenous  PONV Risk Score and Plan: 3 and Ondansetron, Dexamethasone and Treatment may vary due to age or medical condition  Airway Management Planned: Oral ETT  Additional Equipment:   Intra-op Plan:   Post-operative Plan: Extubation in OR  Informed Consent: I have reviewed the patients History and Physical, chart, labs and discussed the procedure including the risks, benefits and alternatives for the proposed anesthesia with the patient or authorized representative who has indicated his/her understanding and acceptance.     Dental advisory given  Plan Discussed with: CRNA and Surgeon  Anesthesia Plan Comments:        Anesthesia Quick Evaluation

## 2022-08-19 NOTE — Interval H&P Note (Signed)
History and Physical Interval Note:  08/19/2022 9:04 AM  Kayla [name]  has presented today for surgery, with the diagnosis of LEFT LOWER LOBE INFILTRATE.  The various methods of treatment have been discussed with the patient and family. After consideration of risks, benefits and other options for treatment, the patient has consented to  Procedure(s): VIDEO BRONCHOSCOPY WITH FLUORO; WITH BAL (Left) as a surgical intervention.  [name] history has been reviewed, patient examined, no change in status, stable for surgery.  I have reviewed the patient's chart and labs.  Questions were answered to the patient's satisfaction.     Rachel Bo Savior Himebaugh

## 2022-08-19 NOTE — Progress Notes (Signed)
Notified Dr. Maple Hudson of pt's elevated WBC's of 29.2. No orders at this time.

## 2022-08-19 NOTE — Transfer of Care (Signed)
Immediate Anesthesia Transfer of Care Note  Patient: Kayla [name]  Procedure(s) Performed: VIDEO BRONCHOSCOPY WITH FLUORO; WITH BAL (Left) BRONCHIAL WASHINGS  Patient Location: PACU  Anesthesia Type:General  Level of Consciousness: awake, drowsy, and patient cooperative  Airway & Oxygen Therapy: Patient Spontanous Breathing  Post-op Assessment: Report given to RN and Post -op Vital signs reviewed and stable  Post vital signs: Reviewed and stable  Last Vitals:  Vitals Value Taken Time  BP 89/77 08/19/22 1204  Temp 36.9 C 08/19/22 1204  Pulse 92 08/19/22 1209  Resp 21 08/19/22 1209  SpO2 93 % 08/19/22 1209  Vitals shown include unvalidated device data.  Last Pain:  Vitals:   08/19/22 0934  TempSrc:   PainSc: 4          Complications: No notable events documented.

## 2022-08-19 NOTE — Anesthesia Postprocedure Evaluation (Signed)
Anesthesia Post Note  Patient: [name]  Procedure(s) Performed: VIDEO BRONCHOSCOPY WITH FLUORO; WITH BAL (Left) BRONCHIAL WASHINGS     Patient location during evaluation: PACU Anesthesia Type: General Level of consciousness: awake and alert Pain management: pain level controlled Vital Signs Assessment: post-procedure vital signs reviewed and stable Respiratory status: spontaneous breathing, nonlabored ventilation, respiratory function stable and patient connected to nasal cannula oxygen Cardiovascular status: blood pressure returned to baseline and stable Postop Assessment: no apparent nausea or vomiting Anesthetic complications: no  No notable events documented.  Last Vitals:  Vitals:   08/19/22 1215 08/19/22 1230  BP: (!) 120/58 122/60  Pulse: 91 96  Resp: 17 14  Temp:    SpO2: 93% 96%    Last Pain:  Vitals:   08/19/22 1230  TempSrc:   PainSc: 0-No pain                 Jaelen Gellerman S

## 2022-08-19 NOTE — Progress Notes (Signed)
Bronch was today. I discharged with a prescription for augmentin. Can you please make sure she picks this up from pharmacy and starts treatment. I suspect she has worsening pneumonia.   Thanks,  BLI  Josephine Igo, DO [facility] 08/19/2022 4:23 PM

## 2022-08-20 DIAGNOSIS — S32511D Fracture of superior rim of right pubis, subsequent encounter for fracture with routine healing: Secondary | ICD-10-CM | POA: Diagnosis not present

## 2022-08-20 LAB — AEROBIC/ANAEROBIC CULTURE W GRAM STAIN (SURGICAL/DEEP WOUND)

## 2022-08-20 LAB — CULTURE, BAL-QUANTITATIVE W GRAM STAIN: Culture: >=100000 — AB

## 2022-08-21 DIAGNOSIS — Z90710 Acquired absence of both cervix and uterus: Secondary | ICD-10-CM | POA: Diagnosis not present

## 2022-08-21 DIAGNOSIS — M810 Age-related osteoporosis without current pathological fracture: Secondary | ICD-10-CM | POA: Diagnosis not present

## 2022-08-21 DIAGNOSIS — Z8262 Family history of osteoporosis: Secondary | ICD-10-CM | POA: Diagnosis not present

## 2022-08-21 DIAGNOSIS — Z8781 Personal history of (healed) traumatic fracture: Secondary | ICD-10-CM | POA: Diagnosis not present

## 2022-08-21 LAB — ACID FAST SMEAR (AFB, MYCOBACTERIA)
Acid Fast Smear: NEGATIVE
Acid Fast Smear: NEGATIVE
Acid Fast Smear: NEGATIVE

## 2022-08-21 LAB — CULTURE, BAL-QUANTITATIVE W GRAM STAIN

## 2022-08-21 LAB — AEROBIC/ANAEROBIC CULTURE W GRAM STAIN (SURGICAL/DEEP WOUND)

## 2022-08-21 LAB — PATHOLOGIST SMEAR REVIEW

## 2022-08-22 LAB — AEROBIC/ANAEROBIC CULTURE W GRAM STAIN (SURGICAL/DEEP WOUND)

## 2022-08-23 LAB — AEROBIC/ANAEROBIC CULTURE W GRAM STAIN (SURGICAL/DEEP WOUND)

## 2022-08-24 ENCOUNTER — Encounter (HOSPITAL_COMMUNITY): Payer: Self-pay | Admitting: Pulmonary Disease

## 2022-08-24 LAB — AEROBIC/ANAEROBIC CULTURE W GRAM STAIN (SURGICAL/DEEP WOUND)

## 2022-08-25 LAB — FUNGUS CULTURE WITH STAIN

## 2022-08-25 NOTE — Progress Notes (Unsigned)
Virtual Visit via Video Note  I connected with Kayla [name] on 08/25/22 at  9:00 AM EDT by a video enabled telemedicine application and verified that I am speaking with the correct person using two identifiers.  Location: Patient:  At home Provider:  [address] [place], [place], [address]    I discussed the limitations of evaluation and management by telemedicine and the availability of in person appointments. The patient expressed understanding and agreed to proceed.  History of Present Illness: Kayla [name] is 72 y.o. never smoker  with history of recurrent pulmonary infiltrates.  Recommendation was to perform video fiberoptic bronchoscopy with biopsies. She underwent Flexible video fiberoptic bronchoscopy and biopsies on 08/19/2022. She was prescribed Augmentin post bronchoscopy as Dr. Tonia Brooms felt she had a worsening pneumonia. Aerobic and anaerobic Culture showed abundant Strep Pneumoniae, BAL showed > 100,000 colonies. Pt. States she has done well after Bronch. Cytology was negative for malignancy. Cultures were    Observations/Objective: 08/19/2022 Path Review Acute inflammation and  Comment: reactive mesothelial cells. Negative for malignancy.   Aerobic/ anaerobic cultures >> Strep Pneumoniae, BAL showed > 100,000 colonies.  AFB : Negative Body Fluid Neutrophils>> H at 92, Monocyte Macrophage L at 2  Assessment and Plan: Cytology negative for malignancy BAL + for Strep Pneumoniae,> 100,000 colonies. Plan Treated with Augmentin 08/19/2022 by Dr. Tonia Brooms day of procedure.   Follow Up Instructions:     I discussed the assessment and treatment plan with the patient. The patient was provided an opportunity to ask questions and all were answered. The patient agreed with the plan and demonstrated an understanding of the instructions.   The patient was advised to call back or seek an in-person evaluation if the symptoms worsen or if the condition fails to improve as  anticipated.  I provided *** minutes of non-face-to-face time during this encounter.   Bevelyn Ngo, NP

## 2022-08-26 ENCOUNTER — Encounter: Payer: Self-pay | Admitting: Acute Care

## 2022-08-26 ENCOUNTER — Telehealth (INDEPENDENT_AMBULATORY_CARE_PROVIDER_SITE_OTHER): Payer: Medicare Other | Admitting: Acute Care

## 2022-08-26 DIAGNOSIS — G35 Multiple sclerosis: Secondary | ICD-10-CM

## 2022-08-26 DIAGNOSIS — Z9889 Other specified postprocedural states: Secondary | ICD-10-CM

## 2022-08-26 DIAGNOSIS — J154 Pneumonia due to other streptococci: Secondary | ICD-10-CM | POA: Diagnosis not present

## 2022-08-26 NOTE — Patient Instructions (Addendum)
It is good to see you today. You biopsy was negative for cancer. The cultures that Dr. Tonia Brooms did were positive for Strep Pneumonia. He has treated you with Augmentin through this coming Saturday 09/01/2022. Continue using Flutter valve as you have been doing.  Add Mucinex with a full glass of water in the mornings  Continue Trelegy daily as you have been doing. Rinse mouth after use. See if you can move your appointment forward with Dr. Livingston Diones to get your IVIG resumed.  Follow up with Maralyn Sago NP or Dr. Tonia Brooms, or Marchelle Gearing  next week in the [place] office with a CXR to ensure you are recovering from your pneumonia.  Call if you get worse not better. Consider repeat swallow eval. Please contact office for sooner follow up if symptoms do not improve or worsen or seek emergency care

## 2022-08-28 ENCOUNTER — Telehealth: Payer: Self-pay | Admitting: Internal Medicine

## 2022-08-28 NOTE — Telephone Encounter (Signed)
Husband states her condition in declining, Breathing becoming more and more labored, Pt husband stated he wants to see if he can get his wife scheduled in office instead of spending the weekend in [facility] hospital Got pt a appt on June 11th at 11:30

## 2022-08-29 ENCOUNTER — Other Ambulatory Visit: Payer: Self-pay | Admitting: Internal Medicine

## 2022-08-29 MED ORDER — SODIUM CHLORIDE 3 % IN NEBU: INHALATION_SOLUTION | Freq: Two times a day (BID) | RESPIRATORY_TRACT | 1 refills | Status: AC | PRN

## 2022-08-29 MED ORDER — ALBUTEROL SULFATE HFA 108 (90 BASE) MCG/ACT IN AERS: INHALATION_SPRAY | RESPIRATORY_TRACT | 1 refills | Status: DC

## 2022-08-29 NOTE — Telephone Encounter (Signed)
Called the pt to verify refills  She needed albuterol inhaler and hypertonic saline  I have sent both to her preferred pharm  Nothing further needed

## 2022-08-31 ENCOUNTER — Emergency Department (HOSPITAL_COMMUNITY): Payer: Medicare Other

## 2022-08-31 ENCOUNTER — Other Ambulatory Visit: Payer: Self-pay

## 2022-08-31 ENCOUNTER — Encounter (HOSPITAL_COMMUNITY): Payer: Self-pay | Admitting: Internal Medicine

## 2022-08-31 ENCOUNTER — Inpatient Hospital Stay (HOSPITAL_COMMUNITY)
Admission: EM | Admit: 2022-08-31 | Discharge: 2022-09-04 | DRG: 871 | Disposition: A | Discharge status: Home / Self Care | Payer: Medicare Other | Attending: Internal Medicine | Admitting: Internal Medicine

## 2022-08-31 ENCOUNTER — Inpatient Hospital Stay (HOSPITAL_COMMUNITY): Payer: Medicare Other

## 2022-08-31 DIAGNOSIS — Z91041 Radiographic dye allergy status: Secondary | ICD-10-CM

## 2022-08-31 DIAGNOSIS — J3802 Paralysis of vocal cords and larynx, bilateral: Secondary | ICD-10-CM | POA: Diagnosis present

## 2022-08-31 DIAGNOSIS — R7881 Bacteremia: Secondary | ICD-10-CM | POA: Diagnosis not present

## 2022-08-31 DIAGNOSIS — Z820 Family history of epilepsy and other diseases of the nervous system: Secondary | ICD-10-CM

## 2022-08-31 DIAGNOSIS — R600 Localized edema: Secondary | ICD-10-CM

## 2022-08-31 DIAGNOSIS — N390 Urinary tract infection, site not specified: Secondary | ICD-10-CM | POA: Diagnosis present

## 2022-08-31 DIAGNOSIS — J9601 Acute respiratory failure with hypoxia: Secondary | ICD-10-CM | POA: Diagnosis present

## 2022-08-31 DIAGNOSIS — D508 Other iron deficiency anemias: Secondary | ICD-10-CM | POA: Diagnosis not present

## 2022-08-31 DIAGNOSIS — Z1152 Encounter for screening for COVID-19: Secondary | ICD-10-CM | POA: Diagnosis not present

## 2022-08-31 DIAGNOSIS — Z96641 Presence of right artificial hip joint: Secondary | ICD-10-CM | POA: Diagnosis present

## 2022-08-31 DIAGNOSIS — K5900 Constipation, unspecified: Secondary | ICD-10-CM | POA: Diagnosis not present

## 2022-08-31 DIAGNOSIS — J690 Pneumonitis due to inhalation of food and vomit: Secondary | ICD-10-CM | POA: Diagnosis present

## 2022-08-31 DIAGNOSIS — Z7189 Other specified counseling: Secondary | ICD-10-CM | POA: Diagnosis not present

## 2022-08-31 DIAGNOSIS — R339 Retention of urine, unspecified: Secondary | ICD-10-CM | POA: Diagnosis present

## 2022-08-31 DIAGNOSIS — D849 Immunodeficiency, unspecified: Secondary | ICD-10-CM | POA: Diagnosis not present

## 2022-08-31 DIAGNOSIS — J440 Chronic obstructive pulmonary disease with acute lower respiratory infection: Secondary | ICD-10-CM | POA: Diagnosis present

## 2022-08-31 DIAGNOSIS — A419 Sepsis, unspecified organism: Secondary | ICD-10-CM | POA: Diagnosis present

## 2022-08-31 DIAGNOSIS — Z8541 Personal history of malignant neoplasm of cervix uteri: Secondary | ICD-10-CM

## 2022-08-31 DIAGNOSIS — G35 Multiple sclerosis: Secondary | ICD-10-CM | POA: Diagnosis present

## 2022-08-31 DIAGNOSIS — J13 Pneumonia due to Streptococcus pneumoniae: Secondary | ICD-10-CM | POA: Diagnosis not present

## 2022-08-31 DIAGNOSIS — Z515 Encounter for palliative care: Secondary | ICD-10-CM | POA: Diagnosis not present

## 2022-08-31 DIAGNOSIS — E038 Other specified hypothyroidism: Secondary | ICD-10-CM | POA: Diagnosis present

## 2022-08-31 DIAGNOSIS — J189 Pneumonia, unspecified organism: Secondary | ICD-10-CM | POA: Diagnosis not present

## 2022-08-31 DIAGNOSIS — J471 Bronchiectasis with (acute) exacerbation: Secondary | ICD-10-CM | POA: Diagnosis not present

## 2022-08-31 DIAGNOSIS — J42 Unspecified chronic bronchitis: Secondary | ICD-10-CM

## 2022-08-31 DIAGNOSIS — B965 Pseudomonas (aeruginosa) (mallei) (pseudomallei) as the cause of diseases classified elsewhere: Secondary | ICD-10-CM | POA: Diagnosis present

## 2022-08-31 DIAGNOSIS — E871 Hypo-osmolality and hyponatremia: Secondary | ICD-10-CM | POA: Diagnosis not present

## 2022-08-31 DIAGNOSIS — B9789 Other viral agents as the cause of diseases classified elsewhere: Secondary | ICD-10-CM | POA: Diagnosis present

## 2022-08-31 DIAGNOSIS — K219 Gastro-esophageal reflux disease without esophagitis: Secondary | ICD-10-CM | POA: Diagnosis present

## 2022-08-31 DIAGNOSIS — E43 Unspecified severe protein-calorie malnutrition: Secondary | ICD-10-CM | POA: Insufficient documentation

## 2022-08-31 DIAGNOSIS — Z79899 Other long term (current) drug therapy: Secondary | ICD-10-CM

## 2022-08-31 DIAGNOSIS — G4700 Insomnia, unspecified: Secondary | ICD-10-CM | POA: Diagnosis present

## 2022-08-31 DIAGNOSIS — J9809 Other diseases of bronchus, not elsewhere classified: Secondary | ICD-10-CM | POA: Diagnosis not present

## 2022-08-31 DIAGNOSIS — J470 Bronchiectasis with acute lower respiratory infection: Secondary | ICD-10-CM | POA: Diagnosis present

## 2022-08-31 DIAGNOSIS — B953 Streptococcus pneumoniae as the cause of diseases classified elsewhere: Secondary | ICD-10-CM | POA: Diagnosis present

## 2022-08-31 DIAGNOSIS — F32A Depression, unspecified: Secondary | ICD-10-CM | POA: Diagnosis not present

## 2022-08-31 DIAGNOSIS — Z888 Allergy status to other drugs, medicaments and biological substances status: Secondary | ICD-10-CM

## 2022-08-31 DIAGNOSIS — R1310 Dysphagia, unspecified: Secondary | ICD-10-CM

## 2022-08-31 DIAGNOSIS — Z8249 Family history of ischemic heart disease and other diseases of the circulatory system: Secondary | ICD-10-CM | POA: Diagnosis not present

## 2022-08-31 DIAGNOSIS — R079 Chest pain, unspecified: Secondary | ICD-10-CM | POA: Diagnosis not present

## 2022-08-31 DIAGNOSIS — R0602 Shortness of breath: Secondary | ICD-10-CM | POA: Diagnosis not present

## 2022-08-31 DIAGNOSIS — B348 Other viral infections of unspecified site: Secondary | ICD-10-CM | POA: Diagnosis not present

## 2022-08-31 DIAGNOSIS — Z800 Family history of malignant neoplasm of digestive organs: Secondary | ICD-10-CM | POA: Diagnosis not present

## 2022-08-31 DIAGNOSIS — E039 Hypothyroidism, unspecified: Secondary | ICD-10-CM | POA: Diagnosis not present

## 2022-08-31 DIAGNOSIS — R059 Cough, unspecified: Secondary | ICD-10-CM | POA: Diagnosis not present

## 2022-08-31 DIAGNOSIS — S32401A Unspecified fracture of right acetabulum, initial encounter for closed fracture: Secondary | ICD-10-CM

## 2022-08-31 DIAGNOSIS — Z885 Allergy status to narcotic agent status: Secondary | ICD-10-CM

## 2022-08-31 DIAGNOSIS — Z79890 Hormone replacement therapy (postmenopausal): Secondary | ICD-10-CM

## 2022-08-31 DIAGNOSIS — R531 Weakness: Secondary | ICD-10-CM | POA: Diagnosis not present

## 2022-08-31 DIAGNOSIS — J449 Chronic obstructive pulmonary disease, unspecified: Secondary | ICD-10-CM | POA: Diagnosis present

## 2022-08-31 HISTORY — DX: Retention of urine, unspecified: R33.9

## 2022-08-31 HISTORY — DX: Paralysis of vocal cords and larynx, bilateral: J38.02

## 2022-08-31 HISTORY — DX: Unspecified asthma, uncomplicated: J45.909

## 2022-08-31 HISTORY — DX: Immunodeficiency, unspecified: D84.9

## 2022-08-31 HISTORY — DX: Diaphragmatic hernia without obstruction or gangrene: K44.9

## 2022-08-31 HISTORY — DX: Depression, unspecified: F32.A

## 2022-08-31 HISTORY — DX: Hypothyroidism, unspecified: E03.9

## 2022-08-31 HISTORY — DX: Bronchiectasis, uncomplicated: J47.9

## 2022-08-31 HISTORY — DX: Insomnia, unspecified: G47.00

## 2022-08-31 LAB — FERRITIN: Ferritin: 206 ng/mL (ref 11–307)

## 2022-08-31 LAB — CBC WITH DIFFERENTIAL/PLATELET
Abs Immature Granulocytes: 0.51 10*3/uL — ABNORMAL HIGH (ref 0.00–0.07)
Basophils Absolute: 0.1 10*3/uL (ref 0.0–0.1)
Basophils Relative: 0 %
Eosinophils Absolute: 0 10*3/uL (ref 0.0–0.5)
Eosinophils Relative: 0 %
HCT: 36.3 % (ref 36.0–46.0)
Hemoglobin: 11.7 g/dL — ABNORMAL LOW (ref 12.0–15.0)
Immature Granulocytes: 2 %
Lymphocytes Relative: 4 %
Lymphs Abs: 1.4 10*3/uL (ref 0.7–4.0)
MCH: 29.1 pg (ref 26.0–34.0)
MCHC: 32.2 g/dL (ref 30.0–36.0)
MCV: 90.3 fL (ref 80.0–100.0)
Monocytes Absolute: 1.3 10*3/uL — ABNORMAL HIGH (ref 0.1–1.0)
Monocytes Relative: 4 %
Neutro Abs: 30.9 10*3/uL — ABNORMAL HIGH (ref 1.7–7.7)
Neutrophils Relative %: 90 %
Platelets: 623 10*3/uL — ABNORMAL HIGH (ref 150–400)
RBC: 4.02 MIL/uL (ref 3.87–5.11)
RDW: 14.8 % (ref 11.5–15.5)
WBC: 34.2 10*3/uL — ABNORMAL HIGH (ref 4.0–10.5)
nRBC: 0 % (ref 0.0–0.2)

## 2022-08-31 LAB — COMPREHENSIVE METABOLIC PANEL
ALT: 13 U/L (ref 0–44)
AST: 16 U/L (ref 15–41)
Albumin: 2.9 g/dL — ABNORMAL LOW (ref 3.5–5.0)
Alkaline Phosphatase: 87 U/L (ref 38–126)
Anion gap: 12 (ref 5–15)
BUN: 14 mg/dL (ref 8–23)
CO2: 22 mmol/L (ref 22–32)
Calcium: 9.2 mg/dL (ref 8.9–10.3)
Chloride: 98 mmol/L (ref 98–111)
Creatinine, Ser: 0.7 mg/dL (ref 0.44–1.00)
GFR, Estimated: >60 mL/min (ref >60)
Glucose, Bld: 92 mg/dL (ref 70–99)
Potassium: 4.3 mmol/L (ref 3.5–5.1)
Sodium: 132 mmol/L — ABNORMAL LOW (ref 135–145)
Total Bilirubin: 0.5 mg/dL (ref 0.3–1.2)
Total Protein: 6.5 g/dL (ref 6.5–8.1)

## 2022-08-31 LAB — URINALYSIS, W/ REFLEX TO CULTURE (INFECTION SUSPECTED)
Bilirubin Urine: NEGATIVE
Glucose, UA: NEGATIVE mg/dL
Hgb urine dipstick: NEGATIVE
Ketones, ur: 20 mg/dL — AB
Nitrite: NEGATIVE
Protein, ur: NEGATIVE mg/dL
Specific Gravity, Urine: 1.019 (ref 1.005–1.030)
pH: 6 (ref 5.0–8.0)

## 2022-08-31 LAB — FOLATE: Folate: 26.1 ng/mL (ref >5.9)

## 2022-08-31 LAB — IRON AND TIBC
Iron: 10 ug/dL — ABNORMAL LOW (ref 28–170)
Saturation Ratios: 5 % — ABNORMAL LOW (ref 10.4–31.8)
TIBC: 200 ug/dL — ABNORMAL LOW (ref 250–450)
UIBC: 190 ug/dL

## 2022-08-31 LAB — BLOOD GAS, VENOUS
Acid-Base Excess: 3.9 mmol/L — ABNORMAL HIGH (ref 0.0–2.0)
Bicarbonate: 28.5 mmol/L — ABNORMAL HIGH (ref 20.0–28.0)
O2 Saturation: 73.3 %
Patient temperature: 36.1
pCO2, Ven: 40 mmHg — ABNORMAL LOW (ref 44–60)
pH, Ven: 7.45 — ABNORMAL HIGH (ref 7.25–7.43)
pO2, Ven: 39 mmHg (ref 32–45)

## 2022-08-31 LAB — RETICULOCYTES
Immature Retic Fract: 15.4 % (ref 2.3–15.9)
RBC.: 4.05 MIL/uL (ref 3.87–5.11)
Retic Count, Absolute: 38.9 10*3/uL (ref 19.0–186.0)
Retic Ct Pct: 1 % (ref 0.4–3.1)

## 2022-08-31 LAB — SARS CORONAVIRUS 2 BY RT PCR: SARS Coronavirus 2 by RT PCR: NEGATIVE

## 2022-08-31 LAB — BRAIN NATRIURETIC PEPTIDE: B Natriuretic Peptide: 106.3 pg/mL — ABNORMAL HIGH (ref 0.0–100.0)

## 2022-08-31 LAB — PROTIME-INR
INR: 1.1 (ref 0.8–1.2)
Prothrombin Time: 14.8 seconds (ref 11.4–15.2)

## 2022-08-31 LAB — VITAMIN B12: Vitamin B-12: 1439 pg/mL — ABNORMAL HIGH (ref 180–914)

## 2022-08-31 LAB — LACTIC ACID, PLASMA: Lactic Acid, Venous: 1.4 mmol/L (ref 0.5–1.9)

## 2022-08-31 LAB — TROPONIN I (HIGH SENSITIVITY)
Troponin I (High Sensitivity): 11 ng/L (ref <18)
Troponin I (High Sensitivity): 10 ng/L (ref <18)

## 2022-08-31 LAB — MRSA NEXT GEN BY PCR, NASAL: MRSA by PCR Next Gen: NOT DETECTED

## 2022-08-31 LAB — D-DIMER, QUANTITATIVE: D-Dimer, Quant: 1.99 ug/mL-FEU — ABNORMAL HIGH (ref 0.00–0.50)

## 2022-08-31 LAB — APTT: aPTT: 41 seconds — ABNORMAL HIGH (ref 24–36)

## 2022-08-31 LAB — PHOSPHORUS: Phosphorus: 3.7 mg/dL (ref 2.5–4.6)

## 2022-08-31 LAB — MAGNESIUM: Magnesium: 2.3 mg/dL (ref 1.7–2.4)

## 2022-08-31 LAB — TSH: TSH: 2.624 u[IU]/mL (ref 0.350–4.500)

## 2022-08-31 LAB — CK: Total CK: 33 U/L — ABNORMAL LOW (ref 38–234)

## 2022-08-31 LAB — PROCALCITONIN: Procalcitonin: 1.62 ng/mL

## 2022-08-31 MED ORDER — TEMAZEPAM 15 MG PO CAPS
15.0000 mg | ORAL_CAPSULE | Freq: Every day | ORAL | Status: DC
  Administered 2022-08-31 – 2022-09-03 (×4): 15 mg via ORAL
  Filled 2022-08-31 (×4): qty 1

## 2022-08-31 MED ORDER — PIPERACILLIN-TAZOBACTAM 3.375 G IVPB
3.3750 g | Freq: Three times a day (TID) | INTRAVENOUS | Status: DC
  Administered 2022-09-01 – 2022-09-02 (×5): 3.375 g via INTRAVENOUS
  Filled 2022-08-31 (×6): qty 50

## 2022-08-31 MED ORDER — ONDANSETRON HCL 4 MG/2ML IJ SOLN: 4.0000 mg | Freq: Four times a day (QID) | INTRAMUSCULAR | Status: DC | PRN

## 2022-08-31 MED ORDER — ALBUTEROL SULFATE (2.5 MG/3ML) 0.083% IN NEBU
INHALATION_SOLUTION | RESPIRATORY_TRACT | Status: AC
  Administered 2022-08-31: 2.5 mg
  Filled 2022-08-31: qty 3

## 2022-08-31 MED ORDER — IPRATROPIUM-ALBUTEROL 0.5-2.5 (3) MG/3ML IN SOLN: 3.0000 mL | Freq: Four times a day (QID) | RESPIRATORY_TRACT | Status: DC | PRN

## 2022-08-31 MED ORDER — DIPHENHYDRAMINE HCL 25 MG PO CAPS: 50.0000 mg | ORAL_CAPSULE | Freq: Once | ORAL | Status: AC

## 2022-08-31 MED ORDER — DIPHENHYDRAMINE HCL 50 MG/ML IJ SOLN
50.0000 mg | Freq: Once | INTRAMUSCULAR | Status: AC
  Administered 2022-08-31: 50 mg via INTRAVENOUS
  Filled 2022-08-31: qty 1

## 2022-08-31 MED ORDER — METHYLPREDNISOLONE SODIUM SUCC 40 MG IJ SOLR
40.0000 mg | Freq: Once | INTRAMUSCULAR | Status: AC
  Administered 2022-08-31: 40 mg via INTRAVENOUS
  Filled 2022-08-31: qty 1

## 2022-08-31 MED ORDER — SODIUM CHLORIDE 0.9 % IV SOLN
500.0000 mg | Freq: Once | INTRAVENOUS | Status: AC
  Administered 2022-08-31: 500 mg via INTRAVENOUS
  Filled 2022-08-31: qty 5

## 2022-08-31 MED ORDER — BISACODYL 10 MG RE SUPP: 10.0000 mg | Freq: Every day | RECTAL | Status: DC | PRN

## 2022-08-31 MED ORDER — ONDANSETRON HCL 4 MG PO TABS: 4.0000 mg | ORAL_TABLET | Freq: Four times a day (QID) | ORAL | Status: DC | PRN

## 2022-08-31 MED ORDER — PIPERACILLIN-TAZOBACTAM 3.375 G IVPB 30 MIN: 3.3750 g | Freq: Once | INTRAVENOUS | Status: DC

## 2022-08-31 MED ORDER — METOCLOPRAMIDE HCL 5 MG/ML IJ SOLN: 5.0000 mg | Freq: Four times a day (QID) | INTRAMUSCULAR | Status: DC | PRN

## 2022-08-31 MED ORDER — LACTATED RINGERS IV SOLN: 150.0000 mL/h | INTRAVENOUS | Status: DC

## 2022-08-31 MED ORDER — SODIUM CHLORIDE 0.9 % IV SOLN: INTRAVENOUS | Status: DC

## 2022-08-31 MED ORDER — SODIUM CHLORIDE 0.9 % IV SOLN
500.0000 mg | INTRAVENOUS | Status: DC
  Filled 2022-08-31: qty 5

## 2022-08-31 MED ORDER — SENNA 8.6 MG PO TABS
1.0000 | ORAL_TABLET | Freq: Two times a day (BID) | ORAL | Status: DC
  Administered 2022-08-31 – 2022-09-04 (×8): 8.6 mg via ORAL
  Filled 2022-08-31 (×8): qty 1

## 2022-08-31 MED ORDER — FLUOXETINE HCL 20 MG PO CAPS
40.0000 mg | ORAL_CAPSULE | Freq: Every day | ORAL | Status: DC
  Administered 2022-08-31 – 2022-09-04 (×5): 40 mg via ORAL
  Filled 2022-08-31 (×5): qty 2

## 2022-08-31 MED ORDER — ALBUTEROL SULFATE HFA 108 (90 BASE) MCG/ACT IN AERS
2.0000 | INHALATION_SPRAY | RESPIRATORY_TRACT | Status: DC | PRN
  Administered 2022-08-31 (×2): 2 via RESPIRATORY_TRACT
  Filled 2022-08-31 (×2): qty 6.7

## 2022-08-31 MED ORDER — SODIUM CHLORIDE 0.9 % IV SOLN
1.0000 g | Freq: Once | INTRAVENOUS | Status: AC
  Administered 2022-08-31: 1 g via INTRAVENOUS
  Filled 2022-08-31: qty 10

## 2022-08-31 MED ORDER — SODIUM CHLORIDE (PF) 0.9 % IJ SOLN
INTRAMUSCULAR | Status: AC
  Filled 2022-08-31: qty 50

## 2022-08-31 MED ORDER — GUAIFENESIN ER 600 MG PO TB12
600.0000 mg | ORAL_TABLET | Freq: Two times a day (BID) | ORAL | Status: DC
  Administered 2022-08-31 – 2022-09-04 (×8): 600 mg via ORAL
  Filled 2022-08-31 (×8): qty 1

## 2022-08-31 MED ORDER — ALBUTEROL SULFATE HFA 108 (90 BASE) MCG/ACT IN AERS: 2.0000 | INHALATION_SPRAY | RESPIRATORY_TRACT | Status: DC | PRN

## 2022-08-31 MED ORDER — PANTOPRAZOLE SODIUM 40 MG PO TBEC
40.0000 mg | DELAYED_RELEASE_TABLET | Freq: Every day | ORAL | Status: DC
  Administered 2022-08-31 – 2022-09-04 (×5): 40 mg via ORAL
  Filled 2022-08-31 (×5): qty 1

## 2022-08-31 MED ORDER — LEVOTHYROXINE SODIUM 100 MCG PO TABS
100.0000 ug | ORAL_TABLET | Freq: Every day | ORAL | Status: DC
  Administered 2022-09-01 – 2022-09-04 (×4): 100 ug via ORAL
  Filled 2022-08-31 (×4): qty 1

## 2022-08-31 MED ORDER — DOCUSATE SODIUM 100 MG PO CAPS
100.0000 mg | ORAL_CAPSULE | Freq: Two times a day (BID) | ORAL | Status: DC
  Administered 2022-08-31 – 2022-09-04 (×8): 100 mg via ORAL
  Filled 2022-08-31 (×8): qty 1

## 2022-08-31 MED ORDER — MIRABEGRON ER 25 MG PO TB24
25.0000 mg | ORAL_TABLET | Freq: Every day | ORAL | Status: DC
  Administered 2022-09-01 – 2022-09-04 (×4): 25 mg via ORAL
  Filled 2022-08-31 (×4): qty 1

## 2022-08-31 MED ORDER — TAMSULOSIN HCL 0.4 MG PO CAPS
0.4000 mg | ORAL_CAPSULE | Freq: Every day | ORAL | Status: DC
  Administered 2022-09-01 – 2022-09-04 (×4): 0.4 mg via ORAL
  Filled 2022-08-31 (×4): qty 1

## 2022-08-31 MED ORDER — IOHEXOL 350 MG/ML SOLN
75.0000 mL | Freq: Once | INTRAVENOUS | Status: AC | PRN
  Administered 2022-08-31: 75 mL via INTRAVENOUS

## 2022-08-31 NOTE — Subjective & Objective (Signed)
Patient history of multiple sclerosis and recurrent pneumonia had bronchoscopy done by pulmonology with cultures grew strep pneumo she was treated with Augmentin and date was 10 June Patient traveled to the beach and there after had to come back early because of increased shortness of breath and also new chest pain Patient has been off of IVIG for about 3 months She may have had trouble swallowing as well and decreased voice strength CT scan done back in May 20 24th showed mucoid impaction and changes suggestive of episodes of aspiration As well as cylindrical bronchiectasis Patient has been on trilogy

## 2022-08-31 NOTE — Assessment & Plan Note (Signed)
-   Check TSH continue home medications Synthroid at 100 mcg po q day  

## 2022-08-31 NOTE — Assessment & Plan Note (Addendum)
Order a KUB and bowel regimen  KUB showed moderate stool burden and possible ileus  No vomiting at this time Discussed with cardiology who feels the obstruction is unlikely Supportive management for tonight Bowel regimen

## 2022-08-31 NOTE — Assessment & Plan Note (Signed)
Obtain bladder scan If increased residuals would do In-N-Out cath if continues to have frequent need for catheterization May place Foley

## 2022-08-31 NOTE — Assessment & Plan Note (Signed)
Broad-spectrum antibiotics add Zosyn for tonight and azithromycin to cover for any atypicals recent BAL grew strep pneumo patient has been on Augmentin but proceeded to have worsening despite this. Some of her symptoms could be secondary to mucous plugging and ongoing aspiration for of worsening bronchiectasis Exacerbated by vocal cord paralysis and inability to clear mucus Appreciate pulmonology consult will see her in the morning Discussed with pulmonology they felt that chest PT for tonight would not be necessary

## 2022-08-31 NOTE — Assessment & Plan Note (Signed)
Continue home medications including albuterol as needed Trelegy

## 2022-08-31 NOTE — Assessment & Plan Note (Signed)
Mild continue to follow in the setting of pulmonary disease

## 2022-08-31 NOTE — Assessment & Plan Note (Signed)
Will need to follow up with Immunology /neurology

## 2022-08-31 NOTE — H&P (Addendum)
Kayla [name] ZOX:[id number] DOB: 05-Apr-1950 DOA: 08/31/2022     PCP: [name], MD   Outpatient Specialists:    NEurology     Dr. Karn Pickler [facility] Neurology at [facility] Pulmonary    Dr. Marchelle Gearing Immunology Dr. Livingston Diones  Patient arrived to ER on 08/31/22 at 1437 Referred by Attending Loetta Rough, MD   Patient coming from:    home Lives  With family    Chief Complaint:   Chief Complaint  Patient presents with   Shortness of Breath    HPI: Kayla [name] is a 72 y.o. female with medical history significant of multiple sclerosis and recurrent pneumonia, COPD/asthma, GERD, hypothyroidism history of urinary retention  Presented with worsening shortness of breath Patient history of multiple sclerosis and recurrent pneumonia had bronchoscopy done by pulmonology with cultures grew strep pneumo she was treated with Augmentin and date was 10 June Patient traveled to the beach and there after had to come back early because of increased shortness of breath and also new chest pain Patient has been off of IVIG for about 3 months She may have had trouble swallowing as well and decreased voice strength CT scan done back in May 20 24th showed mucoid impaction and changes suggestive of episodes of aspiration As well as cylindrical bronchiectasis Patient has been on trilogy   Patient also have had decreased p.o. intake No also for nausea or vomiting no urinary complaints no diarrhea She has no prior history of DVT or PE but she is allergic to contrast and is currently receiving steroids for preparation for CTA Patient states her current symptoms are different from her prior COPD exacerbations  Denies significant ETOH intake   Does not smoke   Lab Results  Component Value Date   SARSCOV2NAA Not Detected 08/15/2022   SARSCOV2NAA NEGATIVE 10/23/2020   SARSCOV2NAA NEGATIVE 09/27/2020   SARSCOV2NAA Not Detected 07/03/2020      Regarding pertinent Chronic problems:       Last echo 2023 no CHF Recent Results (from the past 81191 hour(s))  ECHOCARDIOGRAM COMPLETE   Collection Time: 01/28/22  4:28 PM  Result Value   Area-P 1/2 2.69   S' Lateral 2.80   Narrative      ECHOCARDIOGRAM REPORT     IMPRESSIONS    1. Left ventricular ejection fraction by 3D volume is 53 %. The left ventricle has low normal function. The left ventricle has no regional wall motion abnormalities. Left ventricular diastolic parameters are indeterminate.  2. Right ventricular systolic function is normal. The right ventricular size is normal. Tricuspid regurgitation signal is inadequate for assessing PA pressure.  3. The mitral valve is normal in structure. Mild mitral valve regurgitation. No evidence of mitral stenosis.  4. The aortic valve is normal in structure. Aortic valve regurgitation is trivial. No aortic stenosis is present.  5. The inferior vena cava is normal in size with greater than 50% respiratory variability, suggesting right atrial pressure of 3 mmHg.  Comparison(s): 02/26/13 EF 60-65%. PA pressure .             Hypothyroidism:  Lab Results  Component Value Date   TSH 4.01 05/26/2022   on synthroid     COPD -  followed by pulmonology   not  on baseline oxygen   Trelegy      Chronic anemia - baseline hg Hemoglobin & Hematocrit  Recent Labs    06/24/22 0732 08/19/22 0938 08/31/22 1614  HGB 11.1* 11.8* 11.7*  Iron/TIBC/Ferritin/ %Sat    Component Value Date/Time   IRON 84 05/26/2022 1034   TIBC 271.6 05/26/2022 1034   FERRITIN 173.0 05/26/2022 1034   IRONPCTSAT 30.9 05/26/2022 1034     While in ER: Clinical Course as of 08/31/22 1844  Sun Aug 31, 2022  1632 WBC(!): 34.2 Increased from 29.2 twelve days ago [HN]  1634 Lowest O2 sat observed without oxygen was 92%. Patient states oxygen helps SOB so placed on 2L [place] for comfort. [HN]  1642 D-Dimer, Quant(!): 1.99 Ordering CT PE. [HN]  1802 DG Chest 2 View FINDINGS: Patchy bibasilar  airspace disease is suspicious for pneumonia. No substantial pleural effusion. No pulmonary edema. The cardiopericardial silhouette is within normal limits for size. No acute bony abnormality. Telemetry leads overlie the chest.  IMPRESSION: Patchy bibasilar airspace disease suspicious for pneumonia.   [HN]  1802 Sodium(!): 132 [HN]  1834 B Natriuretic Peptide(!): 106.3 [HN]  1834 pCO2, Ven(!): 40 [HN]  1834 pH, Ven(!): 7.45 [HN]  1834 D/w Dr. Adela Glimpse who requests we d/w pulmonology and will admit the patient.  [HN]  1834 Troponin I (High Sensitivity): 11 [HN]  1835 Admitted to hospitalist, pending pre-treatment for her CT PE, which is in process. Added resp panel 20 pathogens and repeat trop is also pending.  [HN]    Clinical Course User Index [HN] Loetta Rough, MD       Lab Orders         Blood culture (routine x 2)         Respiratory (~20 pathogens) panel by PCR         SARS Coronavirus 2 by RT PCR (hospital order, performed in [facility] hospital lab) *cepheid single result test* Anterior Nasal Swab         Comprehensive metabolic panel         CBC with Differential         Protime-INR         Urinalysis, w/ Reflex to Culture (Infection Suspected) -Urine, Clean Catch         D-dimer, quantitative         Brain natriuretic peptide         Blood gas, venous (at [facility] and [facility])       CXR - Patchy bibasilar airspace disease suspicious for pneumonia.     CTA chest - ordred  Following Medications were ordered in ER: Medications  albuterol (VENTOLIN HFA) 108 (90 Base) MCG/ACT inhaler 2 puff (2 puffs Inhalation Given 08/31/22 1652)  diphenhydrAMINE (BENADRYL) capsule 50 mg (has no administration in time range)    Or  diphenhydrAMINE (BENADRYL) injection 50 mg (has no administration in time range)  azithromycin (ZITHROMAX) 500 mg in sodium chloride 0.9 % 250 mL IVPB (has no administration in time range)  cefTRIAXone (ROCEPHIN) 1 g in sodium chloride 0.9 % 100 mL IVPB (0 g  Intravenous Stopped 08/31/22 1818)  methylPREDNISolone sodium succinate (SOLU-MEDROL) 40 mg/mL injection 40 mg (40 mg Intravenous Given 08/31/22 1719)    _______________________________________________________ ER Provider Called:   Pulmonology   They Recommend admit to medicine   Will see in AM      ED Triage Vitals  Enc Vitals Group     BP 08/31/22 1442 104/76     Pulse Rate 08/31/22 1445 (!) 109     Resp 08/31/22 1445 (!) 27     Temp 08/31/22 1445 98 F (36.7 C)     Temp Source 08/31/22 1445 Axillary  SpO2 08/31/22 1445 92 %     Weight 08/31/22 1443 100 lb (45.4 kg)     Height 08/31/22 1443 5\' 3"  (1.6 m)     Head Circumference --      Peak Flow --      Pain Score 08/31/22 1443 3     Pain Loc --      Pain Edu? --      Excl. in GC? --   TMAX(24)@     _________________________________________ Significant initial  Findings: Abnormal Labs Reviewed  COMPREHENSIVE METABOLIC PANEL - Abnormal; Notable for the following components:      Result Value   Sodium 132 (*)    Albumin 2.9 (*)    All other components within normal limits  CBC WITH DIFFERENTIAL/PLATELET - Abnormal; Notable for the following components:   WBC 34.2 (*)    Hemoglobin 11.7 (*)    Platelets 623 (*)    Neutro Abs 30.9 (*)    Monocytes Absolute 1.3 (*)    Abs Immature Granulocytes 0.51 (*)    All other components within normal limits  D-DIMER, QUANTITATIVE - Abnormal; Notable for the following components:   D-Dimer, Quant 1.99 (*)    All other components within normal limits  BRAIN NATRIURETIC PEPTIDE - Abnormal; Notable for the following components:   B Natriuretic Peptide 106.3 (*)    All other components within normal limits  BLOOD GAS, VENOUS - Abnormal; Notable for the following components:   pH, Ven 7.45 (*)    pCO2, Ven 40 (*)    Bicarbonate 28.5 (*)    Acid-Base Excess 3.9 (*)    All other components within normal limits    _________________________ Troponin   Cardiac Panel (last 3  results) Recent Labs    08/31/22 1703  TROPONINIHS 11     ECG: Ordered Personally reviewed and interpreted by me showing: HR : 105 Rhythm:Sinus tachycardia Biatrial enlargement QTC 437  BNP (last 3 results) Recent Labs    08/31/22 1703  BNP 106.3*     COVID-19 Labs  Recent Labs    08/31/22 1615  DDIMER 1.99*    Lab Results  Component Value Date   SARSCOV2NAA Not Detected 08/15/2022   SARSCOV2NAA NEGATIVE 10/23/2020   SARSCOV2NAA NEGATIVE 09/27/2020   SARSCOV2NAA Not Detected 07/03/2020   ________________ This patient meets SIRS Criteria and may be septic.    The recent clinical data is shown below. Vitals:   08/31/22 1445 08/31/22 1608 08/31/22 1718 08/31/22 1830  BP: 121/60 112/71 110/69 126/69  Pulse: (!) 109 96 91 100  Resp: (!) 27 (!) 25 (!) 25 20  Temp: 98 F (36.7 C)   98.5 F (36.9 C)  TempSrc: Axillary     SpO2: 92% 100% 99% 99%  Weight:      Height:        WBC     Component Value Date/Time   WBC 34.2 (H) 08/31/2022 1614   LYMPHSABS 1.4 08/31/2022 1614   MONOABS 1.3 (H) 08/31/2022 1614   EOSABS 0.0 08/31/2022 1614   BASOSABS 0.1 08/31/2022 1614    Lactic Acid, Venous    Component Value Date/Time   LATICACIDVEN 0.8 10/23/2020 1409     Procalcitonin   Ordered      UA  ordered    Results for orders placed or performed during the hospital encounter of 08/19/22  Fungus Culture With Stain     Status: None (Preliminary result)   Collection Time: 08/19/22 11:39 AM   Specimen:  Bronchial Washing, Right; Respiratory  Result Value Ref Range Status   Fungus Stain Final report  Final    Comment: (NOTE) Performed At: [facility] [address] [place], [place] [id number] Jolene Schimke MD WU:[id number]    Fungus (Mycology) Culture PENDING  Incomplete   Fungal Source BRONCHIAL WASHINGS  Final    Comment: Performed at [facility], [address], [place], [place] [address]  Aerobic/Anaerobic Culture w Gram Stain  (surgical/deep wound)     Status: None   Collection Time: 08/19/22 11:39 AM   Specimen: Bronchial Washing, Right; Respiratory  Result Value Ref Range Status   Specimen Description BRONCHIAL WASHINGS  Final   Special Requests NONE  Final   Gram Stain   Final    ABUNDANT WBC PRESENT, PREDOMINANTLY PMN FEW GRAM POSITIVE COCCI IN PAIRS    Culture   Final    ABUNDANT STREPTOCOCCUS PNEUMONIAE FEW DIPHTHEROIDS(CORYNEBACTERIUM SPECIES) Standardized susceptibility testing for this organism is not available. NO ANAEROBES ISOLATED Performed at [facility], [address] [place], [place] [address]    Report Status 08/24/2022 FINAL  Final   Organism ID, Bacteria STREPTOCOCCUS PNEUMONIAE  Final      Susceptibility   Streptococcus pneumoniae - MIC*    ERYTHROMYCIN >=8 RESISTANT Resistant     LEVOFLOXACIN 0.5 SENSITIVE Sensitive     VANCOMYCIN 0.5 SENSITIVE Sensitive     PENO - penicillin 1      PENICILLIN (non-meningitis) 1 SENSITIVE Sensitive     PENICILLIN (oral) 1 INTERMEDIATE Intermediate     CEFTRIAXONE (non-meningitis) 1 SENSITIVE Sensitive     * ABUNDANT STREPTOCOCCUS PNEUMONIAE  Acid Fast Smear (AFB)     Status: None   Collection Time: 08/19/22 11:39 AM   Specimen: Bronchial Washing, Right; Respiratory  Result Value Ref Range Status   AFB Specimen Processing Concentration  Final   Acid Fast Smear Negative  Final    Comment: (NOTE) Performed At: [facility] [address] [place], [place] [id number] Jolene Schimke MD [id number]    Source (AFB) BRONCHIAL WASHINGS  Final    Comment: Performed at [facility] Lab, [address] [place], [place] [address]  Fungus Culture Result     Status: None   Collection Time: 08/19/22 11:39 AM  Result Value Ref Range Status   Result 1 Comment  Final    Comment: (NOTE) KOH/Calcofluor preparation:  no fungus observed. Performed At: [facility] [address] [place], [place] [id number] Jolene Schimke MD  VZ:[id number]   Fungus Culture With Stain     Status: None (Preliminary result)   Collection Time: 08/19/22 11:43 AM   Specimen: Bronchial Alveolar Lavage; Respiratory  Result Value Ref Range Status   Fungus Stain Final report  Final    Comment: (NOTE) Performed At: [facility] [address] [place], [place] [id number] Jolene Schimke MD SA:[id number]    Fungus (Mycology) Culture PENDING  Incomplete   Fungal Source BRONCHIAL ALVEOLAR LAVAGE  Final    Comment: Performed at [facility], [address], [place], [place] [address]  Culture, BAL-quantitative w Gram Stain     Status: Abnormal   Collection Time: 08/19/22 11:43 AM   Specimen: Bronchial Alveolar Lavage; Respiratory  Result Value Ref Range Status   Specimen Description BRONCHIAL ALVEOLAR LAVAGE  Final   Special Requests NONE  Final   Gram Stain   Final    MODERATE WBC PRESENT, PREDOMINANTLY PMN FEW GRAM POSITIVE COCCI IN PAIRS IN CHAINS Performed  at [facility], [address], [place], [place] [address]    Culture >=100,000 COLONIES/mL STREPTOCOCCUS PNEUMONIAE (A)  Final   Report Status 08/21/2022 FINAL  Final   Organism ID, Bacteria STREPTOCOCCUS PNEUMONIAE (A)  Final      Susceptibility   Streptococcus pneumoniae - MIC*    ERYTHROMYCIN >=8 RESISTANT Resistant     LEVOFLOXACIN <=0.25 SENSITIVE Sensitive     VANCOMYCIN 0.5 SENSITIVE Sensitive     PENICILLIN (meningitis) 0.5 RESISTANT Resistant     PENO - penicillin 0.5      PENICILLIN (non-meningitis) 0.5 SENSITIVE Sensitive     PENICILLIN (oral) 0.5 INTERMEDIATE Intermediate     CEFTRIAXONE (non-meningitis) 1 SENSITIVE Sensitive     CEFTRIAXONE (meningitis) 1 INTERMEDIATE Intermediate     * >=100,000 COLONIES/mL STREPTOCOCCUS PNEUMONIAE  Aerobic/Anaerobic Culture w Gram Stain (surgical/deep wound)     Status: None   Collection Time: 08/19/22 11:43 AM   Specimen: Bronchial Alveolar Lavage; Respiratory  Result Value Ref Range Status    Specimen Description BRONCHIAL ALVEOLAR LAVAGE  Final   Special Requests NONE  Final   Gram Stain   Final    ABUNDANT WBC PRESENT, PREDOMINANTLY PMN FEW GRAM POSITIVE COCCI IN PAIRS IN CHAINS Performed at [facility], [address] [place], [place] [address]    Culture   Final    ABUNDANT STREPTOCOCCUS PNEUMONIAE FEW STAPHYLOCOCCUS EPIDERMIDIS RARE DIPHTHEROIDS(CORYNEBACTERIUM SPECIES) Standardized susceptibility testing for this organism is not available. NO ANAEROBES ISOLATED SUSCEPTIBILITIES PERFORMED ON PREVIOUS CULTURE WITHIN THE LAST 5 DAYS. FOR STREPTOCOCCUS PNEUMONIAE    Report Status 08/24/2022 FINAL  Final   Organism ID, Bacteria STAPHYLOCOCCUS EPIDERMIDIS  Final      Susceptibility   Staphylococcus epidermidis - MIC*    CIPROFLOXACIN <=0.5 SENSITIVE Sensitive     ERYTHROMYCIN <=0.25 SENSITIVE Sensitive     GENTAMICIN <=0.5 SENSITIVE Sensitive     OXACILLIN <=0.25 SENSITIVE Sensitive     TETRACYCLINE <=1 SENSITIVE Sensitive     VANCOMYCIN 1 SENSITIVE Sensitive     TRIMETH/SULFA <=10 SENSITIVE Sensitive     CLINDAMYCIN <=0.25 SENSITIVE Sensitive     RIFAMPIN <=0.5 SENSITIVE Sensitive     Inducible Clindamycin NEGATIVE Sensitive     * FEW STAPHYLOCOCCUS EPIDERMIDIS  Acid Fast Smear (AFB)     Status: None   Collection Time: 08/19/22 11:43 AM   Specimen: Bronchial Alveolar Lavage; Respiratory  Result Value Ref Range Status   AFB Specimen Processing Concentration  Final   Acid Fast Smear Negative  Final    Comment: (NOTE) Performed At: [facility] [address] [place], [place] [id number] Jolene Schimke MD WG:[id number]    Source (AFB) BRONCHIAL ALVEOLAR LAVAGE  Final    Comment: Performed at [facility], [address], [place], [place] [address]  Fungus Culture Result     Status: None   Collection Time: 08/19/22 11:43 AM  Result Value Ref Range Status   Result 1 Comment  Final    Comment: (NOTE) KOH/Calcofluor preparation:  no fungus  observed. Performed At: [facility] [address] [place], [place] [id number] Jolene Schimke MD KG:[id number]   Fungus Culture With Stain     Status: None (Preliminary result)   Collection Time: 08/19/22 11:44 AM   Specimen: Bronchial Alveolar Lavage; Respiratory  Result Value Ref Range Status   Fungus Stain Final report  Final    Comment: (NOTE) Performed At: [facility] [address] [place], [place] [id number] Jolene Schimke MD [id number]:[id number]  Fungus (Mycology) Culture PENDING  Incomplete   Fungal Source BRONCHIAL ALVEOLAR LAVAGE  Final    Comment: Performed at [facility], [address] [place], [place] [address]  Culture, BAL-quantitative w Gram Stain     Status: Abnormal   Collection Time: 08/19/22 11:44 AM   Specimen: Bronchial Alveolar Lavage; Respiratory  Result Value Ref Range Status   Specimen Description BRONCHIAL ALVEOLAR LAVAGE  Final   Special Requests NONE  Final   Gram Stain   Final    FEW WBC PRESENT, PREDOMINANTLY PMN RARE GRAM POSITIVE COCCI IN PAIRS    Culture (A)  Final    >=100,000 COLONIES/mL STREPTOCOCCUS PNEUMONIAE SUSCEPTIBILITIES PERFORMED ON PREVIOUS CULTURE WITHIN THE LAST 5 DAYS. Performed at [facility], [address] [place], [place] [address]    Report Status 08/21/2022 FINAL  Final  Aerobic/Anaerobic Culture w Gram Stain (surgical/deep wound)     Status: None   Collection Time: 08/19/22 11:44 AM   Specimen: Bronchial Alveolar Lavage; Respiratory  Result Value Ref Range Status   Specimen Description BRONCHIAL ALVEOLAR LAVAGE  Final   Special Requests NONE  Final   Gram Stain   Final    FEW WBC PRESENT, PREDOMINANTLY PMN RARE GRAM POSITIVE COCCI IN PAIRS    Culture   Final    ABUNDANT STREPTOCOCCUS PNEUMONIAE SUSCEPTIBILITIES PERFORMED ON PREVIOUS CULTURE WITHIN THE LAST 5 DAYS. NO ANAEROBES ISOLATED Performed at [facility], [address], [place], [place] [address]    Report Status  08/24/2022 FINAL  Final  Acid Fast Smear (AFB)     Status: None   Collection Time: 08/19/22 11:44 AM   Specimen: Bronchial Alveolar Lavage; Respiratory  Result Value Ref Range Status   AFB Specimen Processing Concentration  Final   Acid Fast Smear Negative  Final    Comment: (NOTE) Performed At: [facility] [address] [place], [place] [id number] Jolene Schimke MD [id number]:[id number]    Source (AFB) BRONCHIAL ALVEOLAR LAVAGE  Final    Comment: Performed at [facility], [address], [place], [place] [address]  Fungus Culture Result     Status: None   Collection Time: 08/19/22 11:44 AM  Result Value Ref Range Status   Result 1 Comment  Final    Comment: (NOTE) KOH/Calcofluor preparation:  no fungus observed. Performed At: [facility] [address] [place], [place] [id number] Jolene Schimke MD [id number]     ABX started Antibiotics Given (last 72 hours)     Date/Time Action Medication Dose Rate   08/31/22 1724 New Bag/Given   cefTRIAXone (ROCEPHIN) 1 g in sodium chloride 0.9 % 100 mL IVPB 1 g 200 mL/hr       Susceptibility data from last 90 days. Collected Specimen Info Organism CEFTRIAXONE (meningitis) CEFTRIAXONE (non-meningitis) Ciprofloxacin Clindamycin Erythromycin Gentamicin Susc lslt Inducible Clindamycin LEVOFLOXACIN Oxacillin Penicillin PENICILLIN (meningitis)  08/19/22 Respiratory from Bronchoalveolar Lavage Streptococcus pneumoniae  I  S    R    S     R  08/19/22 Respiratory from Bronchoalveolar Lavage Staphylococcus epidermidis    S  S  S  S  S   S    08/19/22 Respiratory from Bronchial Wash Streptococcus pneumoniae   S    R    S       Collected Specimen Info Organism PENICILLIN (non-meningitis) PENICILLIN (oral) Rifampin TELAVANCIN TETRACYCLINE Trimethoprim/Sulfa  08/19/22 Respiratory from Bronchoalveolar Lavage Streptococcus pneumoniae  S  I   S    08/19/22  Respiratory from Bronchoalveolar Lavage Staphylococcus epidermidis    S  S  S   S  08/19/22 Respiratory from Bronchial Wash Streptococcus pneumoniae  S  I   S       Venous  Blood Gas result:  pH   7.45 High  Acid-Base Excess 3.9 High  mmol/L   pCO2, Ven 40 Low  mmHg O2 Saturation 73.3 %  pO2, Ven 39 mmHg      ABG    Component Value Date/Time   PHART 7.450 09/29/2020 0445   PCO2ART 35.4 09/29/2020 0445   PO2ART 77.6 (L) 09/29/2020 0445   HCO3 28.5 (H) 08/31/2022 1810   TCO2 26 01/07/2017 1617   ACIDBASEDEF 5.3 (H) 02/24/2013 1625   O2SAT 73.3 08/31/2022 1810    _______________________________________________ Recent Labs  Lab 08/31/22 1614  NA 132*  K 4.3  CO2 22  GLUCOSE 92  BUN 14  CREATININE 0.70  CALCIUM 9.2    Cr   stable,    Lab Results  Component Value Date   CREATININE 0.70 08/31/2022   CREATININE 0.84 08/19/2022   CREATININE 0.81 06/24/2022    Recent Labs  Lab 08/31/22 1614  AST 16  ALT 13  ALKPHOS 87  BILITOT 0.5  PROT 6.5  ALBUMIN 2.9*   Lab Results  Component Value Date   CALCIUM 9.2 08/31/2022    Plt: Lab Results  Component Value Date   PLT 623 (H) 08/31/2022    Recent Labs  Lab 08/31/22 1614  WBC 34.2*  NEUTROABS 30.9*  HGB 11.7*  HCT 36.3  MCV 90.3  PLT 623*    HG/HCT stable,      Component Value Date/Time   HGB 11.7 (L) 08/31/2022 1614   HCT 36.3 08/31/2022 1614   MCV 90.3 08/31/2022 1614     _______________________________________________ Hospitalist was called for admission for   Recurrent pneumonia    The following Work up has been ordered so far:  Orders Placed This Encounter  Procedures   Critical Care   Blood culture (routine x 2)   Respiratory (~20 pathogens) panel by PCR   SARS Coronavirus 2 by RT PCR (hospital order, performed in [facility] hospital lab) *cepheid single result test* Anterior Nasal Swab   DG Chest 2 View   CT Angio Chest PE W and/or Wo Contrast   Comprehensive metabolic panel   CBC with Differential   Protime-INR   Urinalysis, w/ Reflex to Culture (Infection  Suspected) -Urine, Clean Catch   D-dimer, quantitative   Brain natriuretic peptide   Blood gas, venous (at [facility] and [facility])   ED Cardiac monitoring   Utilize spacer/aerochamber with mdi inhaler for COVID-19 positive patients or PUI for COVID-19   If O2 Sat <94% administer O2 at 2 liters/minute via nasal cannula   Notify physician (specify)  Specify: Notify provider for possible Code Sepsis   Consult to hospitalist   Consult to pulmonology   Droplet precaution   ED EKG     OTHER Significant initial  Findings:  labs showing:     DM  labs:  HbA1C: No results for input(s): "HGBA1C" in the last 8760 hours.     CBG (last 3)  No results for input(s): "GLUCAP" in the last 72 hours.        Cultures:    Component Value Date/Time   SDES BRONCHIAL ALVEOLAR LAVAGE 08/19/2022 1144   SDES BRONCHIAL ALVEOLAR LAVAGE 08/19/2022 1144   SPECREQUEST NONE 08/19/2022 1144   SPECREQUEST NONE 08/19/2022 1144  CULT (A) 08/19/2022 1144    >=100,000 COLONIES/mL STREPTOCOCCUS PNEUMONIAE SUSCEPTIBILITIES PERFORMED ON PREVIOUS CULTURE WITHIN THE LAST 5 DAYS. Performed at [facility] Lab, [address], [place], [place] [address]    CULT  08/19/2022 1144    ABUNDANT STREPTOCOCCUS PNEUMONIAE SUSCEPTIBILITIES PERFORMED ON PREVIOUS CULTURE WITHIN THE LAST 5 DAYS. NO ANAEROBES ISOLATED Performed at [facility], [address] [place], [place] [address]    REPTSTATUS 08/21/2022 FINAL 08/19/2022 1144   REPTSTATUS 08/24/2022 FINAL 08/19/2022 1144     Radiological Exams on Admission: DG Chest 2 View  Result Date: 08/31/2022 CLINICAL DATA:  Shortness of breath with worsening cough. EXAM: CHEST - 2 VIEW COMPARISON:  06/27/2022 FINDINGS: Patchy bibasilar airspace disease is suspicious for pneumonia. No substantial pleural effusion. No pulmonary edema. The cardiopericardial silhouette is within normal limits for size. No acute bony abnormality. Telemetry leads overlie the chest. IMPRESSION: Patchy  bibasilar airspace disease suspicious for pneumonia. Electronically Signed   By: Kennith Center M.D.   On: 08/31/2022 16:01   _______________________________________________________________________________________________________ Latest  Blood pressure 126/69, pulse 100, temperature 98.5 F (36.9 C), resp. rate 20, height 5\' 3"  (1.6 m), weight 45.4 kg, SpO2 99 %.   Vitals  labs and radiology finding personally reviewed  Review of Systems:    Pertinent positives include:    chills, fatigue chest pain, shortness of breath at rest.   dyspnea on exertion, productive cough,  Constitutional:  No weight loss, night sweats, weight loss  HEENT:  No headaches, Difficulty swallowing,Tooth/dental problems,Sore throat,  No sneezing, itching, ear ache, nasal congestion, post nasal drip,  Cardio-vascular:  No  Orthopnea, PND, anasarca, dizziness, palpitations.no Bilateral lower extremity swelling  GI:  No heartburn, indigestion, abdominal pain, nausea, vomiting, diarrhea, change in bowel habits, loss of appetite, melena, blood in stool, hematemesis Resp:  noNo excess mucus, no No non-productive cough, No coughing up of blood.No change in color of mucus.No wheezing. Skin:  no rash or lesions. No jaundice GU:  no dysuria, change in color of urine, no urgency or frequency. No straining to urinate.  No flank pain.  Musculoskeletal:  No joint pain or no joint swelling. No decreased range of motion. No back pain.  Psych:  No change in mood or affect. No depression or anxiety. No memory loss.  Neuro: no localizing neurological complaints, no tingling, no weakness, no double vision, no gait abnormality, no slurred speech, no confusion  All systems reviewed and apart from HOPI all are negative _______________________________________________________________________________________________ Past Medical History:   Past Medical History:  Diagnosis Date   Arthritis    oa   Cervical cancer (HCC) 2005    COPD (chronic obstructive pulmonary disease) (HCC)    GERD (gastroesophageal reflux disease)    Multiple sclerosis (HCC)    Multiple sclerosis (HCC) 1996   Neuromuscular disorder (HCC)    Osteoporosis    Other diseases of vocal cords    vocal cords are not even   Other voice and resonance disorders    Pneumonia 2014, 1992   Sleep apnea    no cpap used   Unspecified hypothyroidism       Past Surgical History:  Procedure Laterality Date   ABDOMINAL HYSTERECTOMY  03/25/2003   for cervical cancer, complete   BRONCHIAL BIOPSY  10/26/2020   Procedure: BRONCHIAL BIOPSIES;  Surgeon: Josephine Igo, DO;  Location: [facility];  Service: Pulmonary;;   BRONCHIAL BRUSHINGS  10/26/2020   Procedure: BRONCHIAL BRUSHINGS;  Surgeon: Josephine Igo, DO;  Location: [facility];  Service: Pulmonary;;   BRONCHIAL WASHINGS  10/26/2020   Procedure: BRONCHIAL WASHINGS;  Surgeon: Josephine Igo, DO;  Location: [facility];  Service: Pulmonary;;   BRONCHIAL WASHINGS  08/19/2022   Procedure: BRONCHIAL WASHINGS;  Surgeon: Josephine Igo, DO;  Location: [facility];  Service: Cardiopulmonary;;   CATARACT EXTRACTION, BILATERAL Bilateral    lens for cataracts   CONVERSION TO TOTAL HIP Right 09/18/2015   Procedure: CONVERSION OF RIGHT HEMI ARTHROPLASTY TO TOTAL HIP ARTHROPLASTY ACETABULAR REVISON ;  Surgeon: Durene Romans, MD;  Location: [facility];  Service: Orthopedics;  Laterality: Right;   HIP ARTHROPLASTY Right 03/01/2013   Procedure: ARTHROPLASTY BIPOLAR HIP;  Surgeon: Shelda Pal, MD;  Location: [facility];  Service: Orthopedics;  Laterality: Right;   HIP CLOSED REDUCTION Right 03/12/2013   Procedure: CLOSED REDUCTION HIP;  Surgeon: [name], MD;  Location: [facility];  Service: Orthopedics;  Laterality: Right;   HIP CLOSED REDUCTION Right 03/19/2013   Procedure: CLOSED REDUCTION HIP;  Surgeon: Javier Docker, MD;  Location: [facility];  Service: Orthopedics;  Laterality: Right;   HIP CLOSED  REDUCTION Right 11/10/2015   Procedure: CLOSED MANIPULATION HIP;  Surgeon: Samson Frederic, MD;  Location: [facility];  Service: Orthopedics;  Laterality: Right;   HIP CLOSED REDUCTION Right 06/05/2017   Procedure: CLOSED REDUCTION HIP;  Surgeon: Ranee Gosselin, MD;  Location: [facility];  Service: Orthopedics;  Laterality: Right;   SMALL INTESTINE SURGERY     TOTAL HIP REVISION Right 06/06/2017   Procedure: TOTAL HIP REVISION;  Surgeon: Ranee Gosselin, MD;  Location: [facility];  Service: Orthopedics;  Laterality: Right;   VESICOVAGINAL FISTULA CLOSURE W/ TAH  03/25/2003   VIDEO BRONCHOSCOPY N/A 10/26/2020   Procedure: VIDEO BRONCHOSCOPY WITH FLUORO;  Surgeon: Josephine Igo, DO;  Location: [facility];  Service: Pulmonary;  Laterality: N/A;   VIDEO BRONCHOSCOPY Left 08/19/2022   Procedure: VIDEO BRONCHOSCOPY WITH FLUORO; WITH BAL;  Surgeon: Josephine Igo, DO;  Location: [facility];  Service: Cardiopulmonary;  Laterality: Left;    Social History:  Ambulatory    walker     reports that she has never smoked. She has never used smokeless tobacco. She reports that she does not currently use alcohol after a past usage of about 2.0 standard drinks of alcohol per week. She reports that she does not use drugs.     Family History:   Family History  Problem Relation Age of Onset   Other Mother        COVID   Colon cancer Father    Atrial fibrillation Brother    Heart attack Maternal Grandfather    Parkinson's disease Paternal Grandfather    Esophageal cancer Neg Hx    Stomach cancer Neg Hx    Rectal cancer Neg Hx    ______________________________________________________________________________________________ Allergies: Allergies  Allergen Reactions   Tramadol     hallucinations    Contrast Media [Iodinated Contrast Media] Rash   Iohexol Hives   Morphine And Codeine Other (See Comments)    Reaction:  Hallucinations    Methylprednisolone Other (See Comments)    Reaction:  Decreases  pts heart rate    Hydrocodone Itching   Oxycodone-Acetaminophen Rash and Other (See Comments)    Reaction:  Hallucinations    Phenytoin Sodium Extended Rash   Zanaflex [Tizanidine Hydrochloride] Rash     Prior to Admission medications   Medication Sig Start Date End Date Taking? Authorizing Provider  albuterol (VENTOLIN HFA) 108 (90  Base) MCG/ACT inhaler INHALE 2 PUFFS INTO LUNGS EVERY 6 HOURS AS NEEDED. 08/29/22   Kalman Shan, MD  amphetamine-dextroamphetamine (ADDERALL) 20 MG tablet Take 1 tablet (20 mg total) by mouth daily. Patient not taking: Reported on 08/12/2022 07/02/22   [name], MD  baclofen (LIORESAL) 20 MG tablet Take 20 mg by mouth in the morning.    Love, Pamela S, PA-C  calcium carbonate (OS-CAL - DOSED IN MG OF ELEMENTAL CALCIUM) 1250 (500 Ca) MG tablet Take 1 tablet (1,250 mg total) by mouth daily with breakfast. 06/22/22   Arnetha Courser, MD  capsaicin (ZOSTRIX) 0.025 % cream Apply topically every 6 (six) hours as needed (painful hip). Patient not taking: Reported on 08/26/2022 07/01/22   Valetta Fuller, Lynnell Jude, PA-C  Coenzyme Q10 (VITALINE COQ10) 300 MG WAFR Take 300 mg by mouth in the morning.    [provider]  dexlansoprazole (DEXILANT) 60 MG capsule TAKE 1 CAPSULE BY MOUTH  ONCE DAILY WITH BREAKFAST 05/12/22   [name], MD  docusate sodium (COLACE) 100 MG capsule Take 1 capsule (100 mg total) by mouth daily as needed for mild constipation. Patient taking differently: Take 100 mg by mouth in the morning and at bedtime. 06/22/22   Arnetha Courser, MD  dronabinol (MARINOL) 2.5 MG capsule Take 1 capsule (2.5 mg total) by mouth 2 (two) times daily before lunch and supper. 07/08/22   [name], MD  famotidine (PEPCID) 20 MG tablet One after supper Patient taking differently: Take 20 mg by mouth at bedtime. One after supper 06/07/20   [name], MD  Ferric Maltol (ACCRUFER) 30 MG CAPS Take 1 capsule (30 mg total) by mouth in the morning and at bedtime.  07/22/22   [name], MD  FLUoxetine (PROZAC) 40 MG capsule Take 1 capsule (40 mg total) by mouth daily. 04/09/22   [name], MD  Fluticasone-Umeclidin-Vilant (TRELEGY ELLIPTA) 100-62.5-25 MCG/ACT AEPB Inhale 1 puff into the lungs daily. 05/26/22   [name], MD  levothyroxine (SYNTHROID) 100 MCG tablet TAKE 1 TABLET BY MOUTH ONCE  DAILY BEFORE BREAKFAST 12/31/21   [name], MD  methenamine (HIPREX) 1 g tablet Take 1 tablet (1 g total) by mouth 2 (two) times daily with a meal. 08/06/22   [name], MD  mirabegron ER (MYRBETRIQ) 25 MG TB24 tablet Take 1 tablet (25 mg total) by mouth every other day. Patient taking differently: Take 25 mg by mouth in the morning. 07/02/22   Setzer, Lynnell Jude, PA-C  mirtazapine (REMERON) 7.5 MG tablet Take 1 tablet (7.5 mg total) by mouth at bedtime. 08/06/22   [name], MD  Multiple Vitamin (MULTIVITAMIN WITH MINERALS) TABS tablet Take 1 tablet by mouth in the morning.    [provider]  Probiotic Product (PROBIOTIC PO) Take 1 capsule by mouth in the morning. Harmony probiotic    [provider]  Respiratory Therapy Supplies (FLUTTER) DEVI Use as directed 06/02/17   [name], MD  sodium chloride HYPERTONIC 3 % nebulizer solution Take by nebulization 2 (two) times daily as needed for cough. Dx J44.9 08/29/22   Kalman Shan, MD  tamsulosin (FLOMAX) 0.4 MG CAPS capsule Take 0.4 mg by mouth in the morning.    [provider]  temazepam (RESTORIL) 15 MG capsule Take 1 capsule (15 mg total) by mouth at bedtime. 07/11/22   [name], MD  torsemide (DEMADEX) 10 MG tablet Take 1 tablet (10 mg total) by  mouth daily. 07/08/22   [name], MD  trimethoprim (TRIMPEX) 100 MG tablet Take 100 mg by mouth in the morning. 11/02/21   [provider]    ___________________________________________________________________________________________________ Physical Exam:    08/31/2022    6:30 PM 08/31/2022     5:18 PM 08/31/2022    4:08 PM  Vitals with BMI  Systolic 126 110 161  Diastolic 69 69 71  Pulse 100 91 96     1. General:  in No  Acute distress   Chronically ill -appearing 2. Psychological: Alert and  Oriented 3. Head/ENT:    Dry Mucous Membranes                          Head Non traumatic, neck supple                           Poor Dentition 4. SKIN:  decreased Skin turgor,  Skin clean Dry and intact no rash    5. Heart: Regular rate and rhythm no  Murmur, no Rub or gallop 6. Lungs , no wheezes but sig  crackles  , decreased air movement 7. Abdomen: Soft,  non-tender,  distended  bowel sounds present 8. Lower extremities: no clubbing, cyanosis,  trace edema 9. Neurologically Grossly intact, moving all 4 extremities equally   10. MSK: Normal range of motion    Chart has been reviewed  ______________________________________________________________________________________________  Assessment/Plan 72 y.o. female with medical history significant of multiple sclerosis and recurrent pneumonia, COPD/asthma, GERD, hypothyroidism history of urinary retention  Admitted for   Recurrent pneumonia suspicious for aspiration pneumonia and bronchiectasis meeting sepsis  criteria   Present on Admission:  Aspiration pneumonia (HCC)  Hypothyroidism  Multiple sclerosis (HCC)  Iron deficiency anemia secondary to inadequate dietary iron intake  GERD (gastroesophageal reflux disease)  Urinary retention with incomplete bladder emptying  Bilateral vocal cord paralysis  COPD (chronic obstructive pulmonary disease) (HCC)  Chest pain  Hyponatremia  Constipation  Sepsis (HCC)  UTI (urinary tract infection)     Hypothyroidism - Check TSH continue home medications Synthroid at po q day   Multiple sclerosis (HCC) Will need to follow up with Immunology /neurology   Iron deficiency anemia secondary to inadequate dietary iron intake Obtain anemia panel in the morning  GERD  (gastroesophageal reflux disease) Continue Protonix 40 daily  Aspiration pneumonia (HCC) Broad-spectrum antibiotics add Zosyn for tonight and azithromycin to cover for any atypicals recent BAL grew strep pneumo patient has been on Augmentin but proceeded to have worsening despite this. Some of her symptoms could be secondary to mucous plugging and ongoing aspiration for of worsening bronchiectasis Exacerbated by vocal cord paralysis and inability to clear mucus Appreciate pulmonology consult will see her in the morning Discussed with pulmonology they felt that chest PT for tonight would not be necessary  Urinary retention with incomplete bladder emptying Obtain bladder scan If increased residuals would do In-N-Out cath if continues to have frequent need for catheterization May place Foley  Bilateral vocal cord paralysis Contributing to the trouble with aspiration and coughing.  Appreciate pulmonology input  COPD (chronic obstructive pulmonary disease) (HCC) Continue home medications including albuterol as needed Trelegy  Hyponatremia Mild continue to follow in the setting of pulmonary disease  Chest pain Given recent trip and positive D-dimer patient awaiting getting CT angio of the chest patient will need to be premedicated for this  Constipation Order a  KUB and bowel regimen  KUB showed moderate stool burden and possible ileus  No vomiting at this time Discussed with cardiology who feels the obstruction is unlikely Supportive management for tonight Bowel regimen  Sepsis (HCC)  -SIRS criteria met with  elevated white blood cell count,       Component Value Date/Time   WBC 34.2 (H) 08/31/2022 1614   LYMPHSABS 1.4 08/31/2022 1614     tachycardia   ,   fever   RR >20 Today's Vitals   08/31/22 1718 08/31/22 1830 08/31/22 1900 08/31/22 1930  BP: 110/69 126/69 109/60 109/70  Pulse: 91 100 87 89  Resp: (!) 25 20 (!) 22 (!) 21  Temp:  98.5 F (36.9 C)    TempSrc:       SpO2: 99% 99% 98% 96%  Weight:      Height:      PainSc:       Body mass index is 17.71 kg/m.    The recent clinical data is shown below. Vitals:   08/31/22 1718 08/31/22 1830 08/31/22 1900 08/31/22 1930  BP: 110/69 126/69 109/60 109/70  Pulse: 91 100 87 89  Resp: (!) 25 20 (!) 22 (!) 21  Temp:  98.5 F (36.9 C)    TempSrc:      SpO2: 99% 99% 98% 96%  Weight:      Height:         -Most likely source being:  pulmonary    Patient meeting criteria for Severe sepsis with    evidence of end organ damage/organ dysfunction such as    - Obtain serial lactic acid and procalcitonin level.  - Initiated IV antibiotics in ER: Antibiotics Given (last 72 hours)     Date/Time Action Medication Dose Rate   08/31/22 1724 New Bag/Given   cefTRIAXone (ROCEPHIN) 1 g in sodium chloride 0.9 % 100 mL IVPB 1 g 200 mL/hr   08/31/22 1901 New Bag/Given   azithromycin (ZITHROMAX) 500 mg in sodium chloride 0.9 % 250 mL IVPB 500 mg 250 mL/hr       Will continue  on Zosyn and azithromycin   - await results of blood and urine culture  - Rehydrate aggressively  Intravenous fluids were administered,     7:44 PM   Dysphagia Will have speech pathology evaluated.  N.p.o. until CT abdomen done given possible ileus  UTI (urinary tract infection) Urine culture pending Zosyn should cover  Other plan as per orders.  DVT prophylaxis:  SCD     Code Status:    Code Status: Prior FULL CODE as per patient   I had personally discussed CODE STATUS with patient and family    ACP none     Family Communication:   Family  at  Bedside  plan of care was discussed   with  Husband,    Diet dysphagia diet for now until evaluated by SLP   Disposition Plan:       To home once workup is complete and patient is stable   Following barriers for discharge:                                                            Afebrile, white count improving able to transition to PO antibiotics  Will need consultants to evaluate patient prior to discharge        Consult Orders  (From admission, onward)           Start     Ordered   08/31/22 1800  Consult to hospitalist  Once       Provider:  (Not yet assigned)  Question Answer Comment  Place call to: [facility]   Reason for Consult Admit      08/31/22 1759                               Would benefit from PT/OT eval prior to DC  Ordered                                       Consults called: Pulmonology   Admission status:  ED Disposition     ED Disposition  Admit   Condition  --   Comment  Hospital Area: [facility] [[id number]]  Level of Care: Progressive [102]  Admit to Progressive based on following criteria: RESPIRATORY PROBLEMS hypoxemic/hypercapnic respiratory failure that is responsive to NIPPV (BiPAP) or High Flow Nasal Cannula (6-80 lpm). Frequent assessment/intervention, no > Q2 hrs < Q4 hrs, to maintain oxygenation and pulmonary hygiene.  May admit patient to [facility] or [facility] if equivalent level of care is available:: No  Covid Evaluation: Asymptomatic - no recent exposure (last 10 days) testing not required  Diagnosis: Aspiration pneumonia [facility]) [[id number]]  Admitting Physician: Therisa Doyne [[id number]]  Attending Physician: Therisa Doyne [[id number]]  Certification:: I certify this patient will need inpatient services for at least 2 midnights  Estimated Length of Stay: 2            inpatient     I Expect 2 midnight stay secondary to severity of patient's current illness need for inpatient interventions justified by the following:  hemodynamic instability despite optimal treatment (tachycardia  tachypnea  )   Severe lab/radiological/exam abnormalities including:    Aspiration PNA and extensive comorbidities including:   COPD/asthma MS  That are currently affecting medical management.   I expect  patient to be hospitalized for 2 midnights requiring  inpatient medical care.  Patient is at high risk for adverse outcome (such as loss of life or disability) if not treated.  Indication for inpatient stay as follows:   Hemodynamic instability despite maximal medical therapy,    Need for operative/procedural  intervention     Need for IV antibiotics, IV fluids,     Level of care     progressive tele indefinitely please discontinue once patient no longer qualifies COVID-19 Labs    Desire Fulp 08/31/2022, 8:10 PM    [facility]     after 2 AM please page floor coverage PA If 7AM-7PM, please contact the day team taking care of the patient using [email or web address]

## 2022-08-31 NOTE — Assessment & Plan Note (Addendum)
Will have speech pathology evaluated.  N.p.o. until CT abdomen done given possible ileus

## 2022-08-31 NOTE — Assessment & Plan Note (Signed)
Continue Protonix 40 daily

## 2022-08-31 NOTE — Progress Notes (Signed)
Pharmacy Antibiotic Note  Kayla [name] is a 72 y.o. female admitted on 08/31/2022 with aspiration pneumonia.  Pharmacy has been consulted for Zosyn (Piperacillin/Tazobactam) dosing.  Plan: - Zosyn 3.375g IV q8h (4 hour infusion). - Follow up renal function, culture results, and clinical course.   Height: 5\' 3"  (160 cm) Weight: 45.4 kg (100 lb) IBW/kg (Calculated) : 52.4  Temp (24hrs), Avg:98.3 F (36.8 C), Min:98 F (36.7 C), Max:98.5 F (36.9 C)  Recent Labs  Lab 08/31/22 1614  WBC 34.2*  CREATININE 0.70    Estimated Creatinine Clearance: 46.9 mL/min (by C-G formula based on SCr of 0.7 mg/dL).    Allergies  Allergen Reactions   Tramadol     hallucinations    Contrast Media [Iodinated Contrast Media] Rash   Iohexol Hives   Morphine And Codeine Other (See Comments)    Reaction:  Hallucinations    Methylprednisolone Other (See Comments)    Reaction:  Decreases pts heart rate    Hydrocodone Itching   Oxycodone-Acetaminophen Rash and Other (See Comments)    Reaction:  Hallucinations    Phenytoin Sodium Extended Rash   Zanaflex [Tizanidine Hydrochloride] Rash    Antimicrobials this admission: 6/9  Ceftriaxone x1 6/9 Azithromycin >> 6/9 Zosyn >>   Microbiology results: 6/9 BCx: sent 6/9 Sputum: sent  6/9 MRSA PCR: sent  Thank you for allowing pharmacy to be a part of this patient's care.  Kayla [name] Kayla [name] 08/31/2022 7:03 PM

## 2022-08-31 NOTE — ED Triage Notes (Signed)
Pt c/o  SOB for a week that has gotten worse today. Pt audibly wheezing in triage

## 2022-08-31 NOTE — Assessment & Plan Note (Signed)
Urine culture pending Zosyn should cover

## 2022-08-31 NOTE — Assessment & Plan Note (Signed)
-  SIRS criteria met with  elevated white blood cell count,       Component Value Date/Time   WBC 34.2 (H) 08/31/2022 1614   LYMPHSABS 1.4 08/31/2022 1614     tachycardia   ,   fever   RR >20 Today's Vitals   08/31/22 1718 08/31/22 1830 08/31/22 1900 08/31/22 1930  BP: 110/69 126/69 109/60 109/70  Pulse: 91 100 87 89  Resp: (!) 25 20 (!) 22 (!) 21  Temp:  98.5 F (36.9 C)    TempSrc:      SpO2: 99% 99% 98% 96%  Weight:      Height:      PainSc:       Body mass index is 17.71 kg/m.    The recent clinical data is shown below. Vitals:   08/31/22 1718 08/31/22 1830 08/31/22 1900 08/31/22 1930  BP: 110/69 126/69 109/60 109/70  Pulse: 91 100 87 89  Resp: (!) 25 20 (!) 22 (!) 21  Temp:  98.5 F (36.9 C)    TempSrc:      SpO2: 99% 99% 98% 96%  Weight:      Height:         -Most likely source being:  pulmonary    Patient meeting criteria for Severe sepsis with    evidence of end organ damage/organ dysfunction such as    - Obtain serial lactic acid and procalcitonin level.  - Initiated IV antibiotics in ER: Antibiotics Given (last 72 hours)     Date/Time Action Medication Dose Rate   08/31/22 1724 New Bag/Given   cefTRIAXone (ROCEPHIN) 1 g in sodium chloride 0.9 % 100 mL IVPB 1 g 200 mL/hr   08/31/22 1901 New Bag/Given   azithromycin (ZITHROMAX) 500 mg in sodium chloride 0.9 % 250 mL IVPB 500 mg 250 mL/hr       Will continue  on Zosyn and azithromycin   - await results of blood and urine culture  - Rehydrate aggressively  Intravenous fluids were administered,     7:44 PM

## 2022-08-31 NOTE — ED Notes (Signed)
Pt advised U/A collection needed per MD order. Call bell placed at bedside to request assistance as needed. [employer]

## 2022-08-31 NOTE — Assessment & Plan Note (Signed)
Obtain anemia panel in the morning

## 2022-08-31 NOTE — ED Provider Notes (Signed)
[place] EMERGENCY DEPARTMENT AT [facility] Provider Note   CSN: [id number] Arrival date & time: 08/31/22  1437     History  Chief Complaint  Patient presents with   Shortness of Breath    Kayla [name] is a 72 y.o. female with multiple sclerosis, COPD/asthma, GERD, hypothyroidism, urinary retention, who presents with SOB.   SOB for a week that has gotten worse today. Has been dealing with recurrent pulm infiltrates and finished her augmentin yesterday for strep pneumoniae CAP. Patient and her husband state her breathing has significantly worsened over the last few days. She feels increasingly SOB and now has central chest pain as well. She did not have the central chest pain until yesterday. It is constant. They traveled to [place] for vacation with friends at the beach but came home early because she felt so bad. +Decreased PO intake, not eating anything for last 24 hours. Losing weight. No N/V/D, urinary symptoms, leg swelling. Felt SOB [place] but didn't have CP. No h/o DVT/PE. +F/c. States she's at her baseline from an MS standpoint. Doesn't feel like she's having a COPD exacerbation.  Per Dr. Myrlene Broker note on 08/26/22: recurrent pulmonary infiltrates in setting of MS and weakened immune system.  Recommendation was to perform video fiberoptic bronchoscopy with biopsies. She underwent Flexible video fiberoptic bronchoscopy and biopsies on 08/19/2022. She was prescribed Augmentin post bronchoscopy as Dr. Tonia Brooms felt she had a worsening pneumonia. Aerobic and anaerobic Culture showed abundant Strep Pneumoniae, BAL showed > 100,000 colonies. She states she had fever the day of her procedure and then yesterday.  Pt. States she has done well after Bronch. Cytology was negative for malignancy. Cultures were as noted above. She states recovery has been rough.She said all she did yesterday was sleep.      Shortness of Breath      Home  Medications Prior to Admission medications   Medication Sig Start Date End Date Taking? Authorizing Provider  albuterol (VENTOLIN HFA) 108 (90 Base) MCG/ACT inhaler INHALE 2 PUFFS INTO LUNGS EVERY 6 HOURS AS NEEDED. 08/29/22   Kalman Shan, MD  amphetamine-dextroamphetamine (ADDERALL) 20 MG tablet Take 1 tablet (20 mg total) by mouth daily. Patient not taking: Reported on 08/12/2022 07/02/22   [name], MD  baclofen (LIORESAL) 20 MG tablet Take 20 mg by mouth in the morning.    Love, Pamela S, PA-C  calcium carbonate (OS-CAL - DOSED IN MG OF ELEMENTAL CALCIUM) 1250 (500 Ca) MG tablet Take 1 tablet (1,250 mg total) by mouth daily with breakfast. 06/22/22   Arnetha Courser, MD  capsaicin (ZOSTRIX) 0.025 % cream Apply topically every 6 (six) hours as needed (painful hip). Patient not taking: Reported on 08/26/2022 07/01/22   Valetta Fuller, Lynnell Jude, PA-C  Coenzyme Q10 (VITALINE COQ10) 300 MG WAFR Take 300 mg by mouth in the morning.    [provider]  dexlansoprazole (DEXILANT) 60 MG capsule TAKE 1 CAPSULE BY MOUTH  ONCE DAILY WITH BREAKFAST 05/12/22   [name], MD  docusate sodium (COLACE) 100 MG capsule Take 1 capsule (100 mg total) by mouth daily as needed for mild constipation. Patient taking differently: Take 100 mg by mouth in the morning and at bedtime. 06/22/22   Arnetha Courser, MD  dronabinol (MARINOL) 2.5 MG capsule Take 1 capsule (2.5 mg total) by mouth 2 (two) times daily before lunch and supper. 07/08/22   [name], MD  famotidine (PEPCID) 20 MG tablet One  after supper Patient taking differently: Take 20 mg by mouth at bedtime. One after supper 06/07/20   [name], MD  Ferric Maltol (ACCRUFER) 30 MG CAPS Take 1 capsule (30 mg total) by mouth in the morning and at bedtime. 07/22/22   [name], MD  FLUoxetine (PROZAC) 40 MG capsule Take 1 capsule (40 mg total) by mouth daily. 04/09/22   [name], MD  Fluticasone-Umeclidin-Vilant (TRELEGY ELLIPTA) 100-62.5-25  MCG/ACT AEPB Inhale 1 puff into the lungs daily. 05/26/22   [name], MD  levothyroxine (SYNTHROID) 100 MCG tablet TAKE 1 TABLET BY MOUTH ONCE  DAILY BEFORE BREAKFAST 12/31/21   [name], MD  methenamine (HIPREX) 1 g tablet Take 1 tablet (1 g total) by mouth 2 (two) times daily with a meal. 08/06/22   [name], MD  mirabegron ER (MYRBETRIQ) 25 MG TB24 tablet Take 1 tablet (25 mg total) by mouth every other day. Patient taking differently: Take 25 mg by mouth in the morning. 07/02/22   Setzer, Lynnell Jude, PA-C  mirtazapine (REMERON) 7.5 MG tablet Take 1 tablet (7.5 mg total) by mouth at bedtime. 08/06/22   [name], MD  Multiple Vitamin (MULTIVITAMIN WITH MINERALS) TABS tablet Take 1 tablet by mouth in the morning.    [provider]  Probiotic Product (PROBIOTIC PO) Take 1 capsule by mouth in the morning. Harmony probiotic    [provider]  Respiratory Therapy Supplies (FLUTTER) DEVI Use as directed 06/02/17   [name], MD  sodium chloride HYPERTONIC 3 % nebulizer solution Take by nebulization 2 (two) times daily as needed for cough. Dx J44.9 08/29/22   Kalman Shan, MD  tamsulosin (FLOMAX) 0.4 MG CAPS capsule Take 0.4 mg by mouth in the morning.    [provider]  temazepam (RESTORIL) 15 MG capsule Take 1 capsule (15 mg total) by mouth at bedtime. 07/11/22   [name], MD  torsemide (DEMADEX) 10 MG tablet Take 1 tablet (10 mg total) by mouth daily. 07/08/22   [name], MD  trimethoprim (TRIMPEX) 100 MG tablet Take 100 mg by mouth in the morning. 11/02/21   [provider]      Allergies    Tramadol, Contrast media [iodinated contrast media], Iohexol, Morphine and codeine, Methylprednisolone, Hydrocodone, Oxycodone-acetaminophen, Phenytoin sodium extended, and Zanaflex [tizanidine hydrochloride]    Review of Systems   Review of Systems  Respiratory:  Positive for shortness of breath.    Review of systems Positive  for f/c. Negative for hemoptysis.  A 10 point review of systems was performed and is negative unless otherwise reported in HPI.  Physical Exam Updated Vital Signs BP 126/69   Pulse 100   Temp 98.5 F (36.9 C)   Resp 20   Ht 5\' 3"  (1.6 m)   Wt 45.4 kg   SpO2 99%   BMI 17.71 kg/m  Physical Exam General: Chronically ill-appearing female, lying in bed.  HEENT: Sclera anicteric, MMM, trachea midline.  Cardiology: Mild tachycardia, regular rhythm, no murmurs/rubs/gallops. BL radial and DP pulses equal bilaterally.  Resp: Mild tachypnea and increased respiratory effort.  Mild rales in lung bases.  No wheezing. Abd: Soft, non-tender, non-distended. No rebound tenderness or guarding.  GU: Deferred. MSK: No peripheral edema or signs of trauma. Extremities without deformity or TTP. No cyanosis or clubbing. Skin: warm, dry.  Neuro: A&Ox4, CNs II-XII grossly intact. MAEs. Sensation grossly intact.  Psych: Normal mood and affect.   ED  Results / Procedures / Treatments   Labs (all labs ordered are listed, but only abnormal results are displayed) Labs Reviewed  COMPREHENSIVE METABOLIC PANEL - Abnormal; Notable for the following components:      Result Value   Sodium 132 (*)    Albumin 2.9 (*)    All other components within normal limits  CBC WITH DIFFERENTIAL/PLATELET - Abnormal; Notable for the following components:   WBC 34.2 (*)    Hemoglobin 11.7 (*)    Platelets 623 (*)    Neutro Abs 30.9 (*)    Monocytes Absolute 1.3 (*)    Abs Immature Granulocytes 0.51 (*)    All other components within normal limits  D-DIMER, QUANTITATIVE - Abnormal; Notable for the following components:   D-Dimer, Quant 1.99 (*)    All other components within normal limits  BRAIN NATRIURETIC PEPTIDE - Abnormal; Notable for the following components:   B Natriuretic Peptide 106.3 (*)    All other components within normal limits  BLOOD GAS, VENOUS - Abnormal; Notable for the following components:   pH, Ven  7.45 (*)    pCO2, Ven 40 (*)    Bicarbonate 28.5 (*)    Acid-Base Excess 3.9 (*)    All other components within normal limits  CULTURE, BLOOD (ROUTINE X 2)  CULTURE, BLOOD (ROUTINE X 2)  RESPIRATORY PANEL BY PCR  SARS CORONAVIRUS 2 BY RT PCR  PROTIME-INR  URINALYSIS, W/ REFLEX TO CULTURE (INFECTION SUSPECTED)  TROPONIN I (HIGH SENSITIVITY)  TROPONIN I (HIGH SENSITIVITY)    EKG EKG Interpretation  Date/Time:  Sunday August 31 2022 14:46:30 EDT Ventricular Rate:  105 PR Interval:  118 QRS Duration: 83 QT Interval:  330 QTC Calculation: 437 R Axis:   152 Text Interpretation: Sinus tachycardia Biatrial enlargement Confirmed by Vivi Barrack [phone]) on 08/31/2022 4:33:34 PM  Radiology DG Chest 2 View  Result Date: 08/31/2022 CLINICAL DATA:  Shortness of breath with worsening cough. EXAM: CHEST - 2 VIEW COMPARISON:  06/27/2022 FINDINGS: Patchy bibasilar airspace disease is suspicious for pneumonia. No substantial pleural effusion. No pulmonary edema. The cardiopericardial silhouette is within normal limits for size. No acute bony abnormality. Telemetry leads overlie the chest. IMPRESSION: Patchy bibasilar airspace disease suspicious for pneumonia. Electronically Signed   By: Kennith Center M.D.   On: 08/31/2022 16:01    Procedures .Critical Care  Performed by: Loetta Rough, MD Authorized by: Loetta Rough, MD   Critical care provider statement:    Critical care time (minutes):  30   Critical care was necessary to treat or prevent imminent or life-threatening deterioration of the following conditions:  Sepsis   Critical care was time spent personally by me on the following activities:  Development of treatment plan with patient or surrogate, discussions with consultants, evaluation of patient's response to treatment, examination of patient, ordering and review of laboratory studies, ordering and review of radiographic studies, ordering and performing treatments and interventions,  pulse oximetry, re-evaluation of patient's condition, review of old charts and obtaining history from patient or surrogate   Care discussed with: admitting provider       Medications Ordered in ED Medications  albuterol (VENTOLIN HFA) 108 (90 Base) MCG/ACT inhaler 2 puff (2 puffs Inhalation Given 08/31/22 1652)  diphenhydrAMINE (BENADRYL) capsule 50 mg (has no administration in time range)    Or  diphenhydrAMINE (BENADRYL) injection 50 mg (has no administration in time range)  azithromycin (ZITHROMAX) 500 mg in sodium chloride 0.9 % 250 mL IVPB (has no  administration in time range)  cefTRIAXone (ROCEPHIN) 1 g in sodium chloride 0.9 % 100 mL IVPB (0 g Intravenous Stopped 08/31/22 1818)  methylPREDNISolone sodium succinate (SOLU-MEDROL) 40 mg/mL injection 40 mg (40 mg Intravenous Given 08/31/22 1719)    ED Course/ Medical Decision Making/ A&P                          Medical Decision Making Amount and/or Complexity of Data Reviewed Labs: ordered. Decision-making details documented in ED Course. Radiology: ordered. Decision-making details documented in ED Course.  Risk Prescription drug management. Decision regarding hospitalization.    This patient presents to the ED for concern of dyspnea/cough/f/c/ this involves an extensive number of treatment options, and is a complaint that carries with it a high risk of complications and morbidity.  I considered the following differential and admission for this acute, potentially life threatening condition.   MDM:    DDX for dyspnea includes but is not limited to:  Patient's likely recurrent PNA failure of o/p treatment causing her symptoms. Continued cough w/ yellow/white sputum, now worsening. Patient does have h/o COPD but there is no wheezing on exam and patient states this feels different from prior COP exacerbations. For PE, patient does have known PNA, however patient does report new chest pain that started after recent travel 4 hours each  way by car, and decreased mobility d/t illness. Will add d-dimer and likely CT PE to r/o new PE. For CP will also get troponin to r/o ACS, BNP to consider CHF. Consider viral URI as well worsening symptoms.  Consider sepsis as well given tachypnea, leukocytosis, and tachycardia, will draw blood cultures and start IV abx.   Clinical Course as of 08/31/22 1836  Wynelle Link Aug 31, 2022  1632 WBC(!): 34.2 Increased from 29.2 twelve days ago [HN]  1634 Lowest O2 sat observed without oxygen was 92%. Patient states oxygen helps SOB so placed on 2L [place] for comfort. [HN]  1642 D-Dimer, Quant(!): 1.99 Ordering CT PE. [HN]  1802 DG Chest 2 View FINDINGS: Patchy bibasilar airspace disease is suspicious for pneumonia. No substantial pleural effusion. No pulmonary edema. The cardiopericardial silhouette is within normal limits for size. No acute bony abnormality. Telemetry leads overlie the chest.  IMPRESSION: Patchy bibasilar airspace disease suspicious for pneumonia.   [HN]  1802 Sodium(!): 132 [HN]  1834 B Natriuretic Peptide(!): 106.3 [HN]  1834 pCO2, Ven(!): 40 [HN]  1834 pH, Ven(!): 7.45 [HN]  1834 D/w Dr. Adela Glimpse who requests we d/w pulmonology and will admit the patient.  [HN]  1834 Troponin I (High Sensitivity): 11 [HN]  1835 Admitted to hospitalist, pending pre-treatment for her CT PE, which is in process. Added resp panel 20 pathogens and repeat trop is also pending.  [HN]    Clinical Course User Index [HN] Loetta Rough, MD    Labs: I Ordered, and personally interpreted labs.  The pertinent results include:  those listed above  Imaging Studies ordered: I ordered imaging studies including CXR, CT PE I independently visualized and interpreted imaging. I agree with the radiologist interpretation  Additional history obtained from chart review, husband at bedside.    Cardiac Monitoring: The patient was maintained on a cardiac monitor.  I personally viewed and interpreted the cardiac  monitored which showed an underlying rhythm of: sinus tachycardia, normal sinus rhythm  Reevaluation: After the interventions noted above, I reevaluated the patient and found that they have :stayed the same  Social Determinants of  Health: Patient lives independently   Disposition:  Admit to medicine  Co morbidities that complicate the patient evaluation  Past Medical History:  Diagnosis Date   Arthritis    oa   Cervical cancer (HCC) 2005   COPD (chronic obstructive pulmonary disease) (HCC)    GERD (gastroesophageal reflux disease)    Multiple sclerosis (HCC)    Multiple sclerosis (HCC) 1996   Neuromuscular disorder (HCC)    Osteoporosis    Other diseases of vocal cords    vocal cords are not even   Other voice and resonance disorders    Pneumonia 2014, 1992   Sleep apnea    no cpap used   Unspecified hypothyroidism      Medicines Meds ordered this encounter  Medications   albuterol (VENTOLIN HFA) 108 (90 Base) MCG/ACT inhaler 2 puff   cefTRIAXone (ROCEPHIN) 1 g in sodium chloride 0.9 % 100 mL IVPB    Order Specific Question:   Antibiotic Indication:    Answer:   CAP   methylPREDNISolone sodium succinate (SOLU-MEDROL) 40 mg/mL injection 40 mg    IV methylprednisolone will be converted to either a q12h or q24h frequency with the same total daily dose (TDD).  Ordered Dose: 1 to 125 mg TDD; convert to: TDD q24h.  Ordered Dose: 126 to 250 mg TDD; convert to: TDD div q12h.  Ordered Dose: >250 mg TDD; DAW.   OR Linked Order Group    diphenhydrAMINE (BENADRYL) capsule 50 mg    diphenhydrAMINE (BENADRYL) injection 50 mg   azithromycin (ZITHROMAX) 500 mg in sodium chloride 0.9 % 250 mL IVPB    Order Specific Question:   Antibiotic Indication:    Answer:   CAP    I have reviewed the patients home medicines and have made adjustments as needed  Problem List / ED Course: Problem List Items Addressed This Visit   None Visit Diagnoses     Recurrent pneumonia    -  Primary    Relevant Medications   albuterol (VENTOLIN HFA) 108 (90 Base) MCG/ACT inhaler 2 puff   cefTRIAXone (ROCEPHIN) 1 g in sodium chloride 0.9 % 100 mL IVPB (Completed)   diphenhydrAMINE (BENADRYL) capsule 50 mg (Start on 08/31/2022  7:45 PM)   diphenhydrAMINE (BENADRYL) injection 50 mg (Start on 08/31/2022  7:45 PM)   azithromycin (ZITHROMAX) 500 mg in sodium chloride 0.9 % 250 mL IVPB (Start on 08/31/2022  6:45 PM)                   This note was created using dictation software, which may contain spelling or grammatical errors.    Loetta Rough, MD 08/31/22 [phone]

## 2022-08-31 NOTE — Assessment & Plan Note (Signed)
Given recent trip and positive D-dimer patient awaiting getting CT angio of the chest patient will need to be premedicated for this

## 2022-08-31 NOTE — Assessment & Plan Note (Signed)
Contributing to the trouble with aspiration and coughing.  Appreciate pulmonology input

## 2022-08-31 NOTE — ED Notes (Signed)
ED TO INPATIENT HANDOFF REPORT  ED Nurse Name and Phone #:  Claiborne Rigg Name/Age/Gender Kayla [name] 72 y.o. female Room/Bed: WA22/WA22  Code Status   Code Status: Full Code  Home/SNF/Other Home Patient oriented to: self, place, time, and situation Is this baseline? Yes   Triage Complete: Triage complete  Chief Complaint Aspiration pneumonia (HCC) [J69.0]  Triage Note Pt c/o  SOB for a week that has gotten worse today. Pt audibly wheezing in triage   Allergies Allergies  Allergen Reactions   Tramadol     hallucinations    Contrast Media [Iodinated Contrast Media] Rash   Iohexol Hives   Morphine And Codeine Other (See Comments)    Reaction:  Hallucinations    Methylprednisolone Other (See Comments)    Reaction:  Decreases pts heart rate    Hydrocodone Itching   Oxycodone-Acetaminophen Rash and Other (See Comments)    Reaction:  Hallucinations    Phenytoin Sodium Extended Rash   Zanaflex [Tizanidine Hydrochloride] Rash    Level of Care/Admitting Diagnosis ED Disposition     ED Disposition  Admit   Condition  --   Comment  Hospital Area: [facility] [[id number]]  Level of Care: Progressive [102]  Admit to Progressive based on following criteria: RESPIRATORY PROBLEMS hypoxemic/hypercapnic respiratory failure that is responsive to NIPPV (BiPAP) or High Flow Nasal Cannula (6-80 lpm). Frequent assessment/intervention, no > Q2 hrs < Q4 hrs, to maintain oxygenation and pulmonary hygiene.  May admit patient to [facility] or [facility] if equivalent level of care is available:: No  Covid Evaluation: Asymptomatic - no recent exposure (last 10 days) testing not required  Diagnosis: Aspiration pneumonia [facility]) [161096]  Admitting Physician: Therisa Doyne [[id number]]  Attending Physician: Therisa Doyne [[id number]]  Certification:: I certify this patient will need inpatient services for at least 2 midnights  Estimated Length of Stay: 2           B Medical/Surgery History Past Medical History:  Diagnosis Date   Arthritis    oa   Cervical cancer (HCC) 2005   COPD (chronic obstructive pulmonary disease) (HCC)    GERD (gastroesophageal reflux disease)    Multiple sclerosis (HCC)    Multiple sclerosis (HCC) 1996   Neuromuscular disorder (HCC)    Osteoporosis    Other diseases of vocal cords    vocal cords are not even   Other voice and resonance disorders    Pneumonia 2014, 1992   Sleep apnea    no cpap used   Unspecified hypothyroidism    Past Surgical History:  Procedure Laterality Date   ABDOMINAL HYSTERECTOMY  03/25/2003   for cervical cancer, complete   BRONCHIAL BIOPSY  10/26/2020   Procedure: BRONCHIAL BIOPSIES;  Surgeon: Josephine Igo, DO;  Location: [facility];  Service: Pulmonary;;   BRONCHIAL BRUSHINGS  10/26/2020   Procedure: BRONCHIAL BRUSHINGS;  Surgeon: Josephine Igo, DO;  Location: [facility];  Service: Pulmonary;;   BRONCHIAL WASHINGS  10/26/2020   Procedure: BRONCHIAL WASHINGS;  Surgeon: Josephine Igo, DO;  Location: [facility];  Service: Pulmonary;;   BRONCHIAL WASHINGS  08/19/2022   Procedure: BRONCHIAL WASHINGS;  Surgeon: Josephine Igo, DO;  Location: [facility];  Service: Cardiopulmonary;;   CATARACT EXTRACTION, BILATERAL Bilateral    lens for cataracts   CONVERSION TO TOTAL HIP Right 09/18/2015   Procedure: CONVERSION OF RIGHT HEMI ARTHROPLASTY TO TOTAL HIP ARTHROPLASTY ACETABULAR REVISON ;  Surgeon: Durene Romans, MD;  Location: [facility];  Service:  Orthopedics;  Laterality: Right;   HIP ARTHROPLASTY Right 03/01/2013   Procedure: ARTHROPLASTY BIPOLAR HIP;  Surgeon: Shelda Pal, MD;  Location: [facility];  Service: Orthopedics;  Laterality: Right;   HIP CLOSED REDUCTION Right 03/12/2013   Procedure: CLOSED REDUCTION HIP;  Surgeon: [name], MD;  Location: [facility];  Service: Orthopedics;  Laterality: Right;   HIP CLOSED REDUCTION Right 03/19/2013   Procedure: CLOSED  REDUCTION HIP;  Surgeon: Javier Docker, MD;  Location: [facility];  Service: Orthopedics;  Laterality: Right;   HIP CLOSED REDUCTION Right 11/10/2015   Procedure: CLOSED MANIPULATION HIP;  Surgeon: Samson Frederic, MD;  Location: [facility];  Service: Orthopedics;  Laterality: Right;   HIP CLOSED REDUCTION Right 06/05/2017   Procedure: CLOSED REDUCTION HIP;  Surgeon: Ranee Gosselin, MD;  Location: [facility];  Service: Orthopedics;  Laterality: Right;   SMALL INTESTINE SURGERY     TOTAL HIP REVISION Right 06/06/2017   Procedure: TOTAL HIP REVISION;  Surgeon: Ranee Gosselin, MD;  Location: [facility];  Service: Orthopedics;  Laterality: Right;   VESICOVAGINAL FISTULA CLOSURE W/ TAH  03/25/2003   VIDEO BRONCHOSCOPY N/A 10/26/2020   Procedure: VIDEO BRONCHOSCOPY WITH FLUORO;  Surgeon: Josephine Igo, DO;  [facility];  Service: Pulmonary;  Laterality: N/A;   VIDEO BRONCHOSCOPY Left 08/19/2022   Procedure: VIDEO BRONCHOSCOPY WITH FLUORO; WITH BAL;  Surgeon: Josephine Igo, DO;  Location: [facility];  Service: Cardiopulmonary;  Laterality: Left;     A IV Location/Drains/Wounds Patient Lines/Drains/Airways Status     Active Line/Drains/Airways     Name Placement date Placement time Site Days   Peripheral IV 08/31/22 20 G 1" Posterior;Right Hand 08/31/22  1611  Hand  less than 1   Peripheral IV 08/31/22 20 G 1" Left Antecubital 08/31/22  1700  Antecubital  less than 1   Wound / Incision (Open or Dehisced) 06/22/22 Laceration Head Lower;Posterior open to air;staples 06/22/22  1045  Head  70            Intake/Output Last 24 hours  Intake/Output Summary (Last 24 hours) at 08/31/2022 1917 Last data filed at 08/31/2022 1818 Gross per 24 hour  Intake 99 ml  Output --  Net 99 ml    Labs/Imaging Results for orders placed or performed during the hospital encounter of 08/31/22 (from the past 48 hour(s))  Comprehensive metabolic panel     Status: Abnormal   Collection Time: 08/31/22  4:14 PM   Result Value Ref Range   Sodium 132 (L) 135 - 145 mmol/L   Potassium 4.3 3.5 - 5.1 mmol/L   Chloride 98 98 - 111 mmol/L   CO2 22 22 - 32 mmol/L   Glucose, Bld 92 70 - 99 mg/dL    Comment: Glucose reference range applies only to samples taken after fasting for at least 8 hours.   BUN 14 8 - 23 mg/dL   Creatinine, Ser 1.61 0.44 - 1.00 mg/dL   Calcium 9.2 8.9 - 09.6 mg/dL   Total Protein 6.5 6.5 - 8.1 g/dL   Albumin 2.9 (L) 3.5 - 5.0 g/dL   AST 16 15 - 41 U/L   ALT 13 0 - 44 U/L   Alkaline Phosphatase 87 38 - 126 U/L   Total Bilirubin 0.5 0.3 - 1.2 mg/dL   GFR, Estimated >04 >54 mL/min    Comment: (NOTE) Calculated using the CKD-EPI Creatinine Equation (2021)    Anion gap 12 5 - 15    Comment: Performed at  [facility], [address], [place], [place] [address]  CBC with Differential     Status: Abnormal   Collection Time: 08/31/22  4:14 PM  Result Value Ref Range   WBC 34.2 (H) 4.0 - 10.5 K/uL   RBC 4.02 3.87 - 5.11 MIL/uL   Hemoglobin 11.7 (L) 12.0 - 15.0 g/dL   HCT 60.4 54.0 - 98.1 %   MCV 90.3 80.0 - 100.0 fL   MCH 29.1 26.0 - 34.0 pg   MCHC 32.2 30.0 - 36.0 g/dL   RDW 19.1 47.8 - 29.5 %   Platelets 623 (H) 150 - 400 K/uL   nRBC 0.0 0.0 - 0.2 %   Neutrophils Relative % 90 %    Comment: Predominantly Neutrophils Seen   Neutro Abs 30.9 (H) 1.7 - 7.7 K/uL   Lymphocytes Relative 4 %   Lymphs Abs 1.4 0.7 - 4.0 K/uL   Monocytes Relative 4 %   Monocytes Absolute 1.3 (H) 0.1 - 1.0 K/uL   Eosinophils Relative 0 %   Eosinophils Absolute 0.0 0.0 - 0.5 K/uL   Basophils Relative 0 %   Basophils Absolute 0.1 0.0 - 0.1 K/uL   Immature Granulocytes 2 %   Abs Immature Granulocytes 0.51 (H) 0.00 - 0.07 K/uL    Comment: Performed at [facility], [address], [place], [place] [address]  Protime-INR     Status: None   Collection Time: 08/31/22  4:14 PM  Result Value Ref Range   Prothrombin Time 14.8 11.4 - 15.2 seconds   INR 1.1 0.8 - 1.2     Comment: (NOTE) INR goal varies based on device and disease states. Performed at [facility], [address] [place], [place] [address]   D-dimer, quantitative     Status: Abnormal   Collection Time: 08/31/22  4:15 PM  Result Value Ref Range   D-Dimer, Quant 1.99 (H) 0.00 - 0.50 ug/mL-FEU    Comment: (NOTE) At the manufacturer cut-off value of 0.5 g/mL FEU, this assay has a negative predictive value of 95-100%.This assay is intended for use in conjunction with a clinical pretest probability (PTP) assessment model to exclude pulmonary embolism (PE) and deep venous thrombosis (DVT) in outpatients suspected of PE or DVT. Results should be correlated with clinical presentation. Performed at [facility], [address], [place], [place] [address]   Troponin I (High Sensitivity)     Status: None   Collection Time: 08/31/22  5:03 PM  Result Value Ref Range   Troponin I (High Sensitivity) 11 <18 ng/L    Comment: (NOTE) Elevated high sensitivity troponin I (hsTnI) values and significant  changes across serial measurements may suggest ACS but many other  chronic and acute conditions are known to elevate hsTnI results.  Refer to the "Links" section for chest pain algorithms and additional  guidance. Performed at [facility], [address], [place], [place] [address]   Brain natriuretic peptide     Status: Abnormal   Collection Time: 08/31/22  5:03 PM  Result Value Ref Range   B Natriuretic Peptide 106.3 (H) 0.0 - 100.0 pg/mL    Comment: Performed at [facility], [address] [place], [place] [address]  Blood gas, venous (at [facility])     Status: Abnormal   Collection Time: 08/31/22  6:10 PM  Result Value Ref Range   pH, Ven 7.45 (H) 7.25 - 7.43   pCO2, Ven 40 (L)  44 - 60 mmHg   pO2, Ven 39 32 - 45 mmHg   Bicarbonate 28.5 (H) 20.0 - 28.0 mmol/L   Acid-Base Excess 3.9 (H) 0.0 - 2.0 mmol/L   O2  Saturation 73.3 %   Patient temperature 36.1     Comment: Performed at [facility], [address], [place], [place] [address]   DG Chest 2 View  Result Date: 08/31/2022 CLINICAL DATA:  Shortness of breath with worsening cough. EXAM: CHEST - 2 VIEW COMPARISON:  06/27/2022 FINDINGS: Patchy bibasilar airspace disease is suspicious for pneumonia. No substantial pleural effusion. No pulmonary edema. The cardiopericardial silhouette is within normal limits for size. No acute bony abnormality. Telemetry leads overlie the chest. IMPRESSION: Patchy bibasilar airspace disease suspicious for pneumonia. Electronically Signed   By: Kennith Center M.D.   On: 08/31/2022 16:01    Pending Labs Unresulted Labs (From admission, onward)     Start     Ordered   09/01/22 0500  Prealbumin  Tomorrow morning,   R        08/31/22 1852   08/31/22 1858  MRSA Next Gen by PCR, Nasal  Once,   R       Question Answer Comment  Patient immune status Immunocompromised   Release to patient Immediate      08/31/22 1857   08/31/22 1853  CK  Add-on,   AD        08/31/22 1852   08/31/22 1853  Magnesium  Add-on,   AD        08/31/22 1852   08/31/22 1853  Phosphorus  Add-on,   AD        08/31/22 1852   08/31/22 1853  TSH  Add-on,   AD        08/31/22 1852   08/31/22 1853  Vitamin B12  (Anemia Panel (PNL))  Once,   R        08/31/22 1852   08/31/22 1853  Folate  (Anemia Panel (PNL))  Once,   R        08/31/22 1852   08/31/22 1853  Iron and TIBC  (Anemia Panel (PNL))  Once,   R        08/31/22 1852   08/31/22 1853  Ferritin  (Anemia Panel (PNL))  Once,   R        08/31/22 1852   08/31/22 1853  Reticulocytes  (Anemia Panel (PNL))  Once,   R        08/31/22 1852   08/31/22 1853  Legionella Pneumophila Serogp 1 Ur Ag  Once,   R        08/31/22 1853   08/31/22 1853  Expectorated Sputum Assessment w Gram Stain, Rflx to Resp Cult  Once,   R        08/31/22 1853   08/31/22 1850  Lactic acid, plasma  STAT  Now then every 2 hours,   R (with STAT occurrences)      08/31/22 1850   08/31/22 1850  APTT  ONCE - STAT,   STAT        08/31/22 1850   08/31/22 1850  Procalcitonin  ONCE - URGENT,   URGENT       References:    Procalcitonin Lower Respiratory Tract Infection AND Sepsis Procalcitonin Algorithm   08/31/22 1850   08/31/22 1836  Respiratory (~20 pathogens) panel by PCR  (Respiratory panel by PCR (~20 pathogens, ~24 hr TAT)  w precautions)  Once,  URGENT        08/31/22 1835   08/31/22 1836  SARS Coronavirus 2 by RT PCR (hospital order, performed in [facility] [place] hospital lab) *cepheid single result test* Anterior Nasal Swab  Once,   URGENT        08/31/22 1835   08/31/22 1633  Blood culture (routine x 2)  BLOOD CULTURE X 2,   R (with STAT occurrences)      08/31/22 1632   08/31/22 1448  Urinalysis, w/ Reflex to Culture (Infection Suspected) -Urine, Clean Catch  Once,   URGENT       Question:  Specimen Source  Answer:  Urine, Clean Catch   08/31/22 1448   Signed and Held  Magnesium  Tomorrow morning,   R        Signed and Held   Signed and Held  Phosphorus  Tomorrow morning,   R        Signed and Held   Signed and Held  Comprehensive metabolic panel  Tomorrow morning,   R       Question:  Release to patient  Answer:  Immediate   Signed and Held   Signed and Held  CBC  Tomorrow morning,   R       Question:  Release to patient  Answer:  Immediate   Signed and Held            Vitals/Pain Today's Vitals   08/31/22 1445 08/31/22 1608 08/31/22 1718 08/31/22 1830  BP: 121/60 112/71 110/69 126/69  Pulse: (!) 109 96 91 100  Resp: (!) 27 (!) 25 (!) 25 20  Temp: 98 F (36.7 C)   98.5 F (36.9 C)  TempSrc: Axillary     SpO2: 92% 100% 99% 99%  Weight:      Height:      PainSc:        Isolation Precautions Droplet precaution  Medications Medications  albuterol (VENTOLIN HFA) 108 (90 Base) MCG/ACT inhaler 2 puff (2 puffs Inhalation Given 08/31/22 1652)  diphenhydrAMINE (BENADRYL)  capsule 50 mg (has no administration in time range)    Or  diphenhydrAMINE (BENADRYL) injection 50 mg (has no administration in time range)  azithromycin (ZITHROMAX) 500 mg in sodium chloride 0.9 % 250 mL IVPB (500 mg Intravenous New Bag/Given 08/31/22 1901)  lactated ringers infusion (has no administration in time range)  azithromycin (ZITHROMAX) 500 mg in sodium chloride 0.9 % 250 mL IVPB (has no administration in time range)  piperacillin-tazobactam (ZOSYN) IVPB 3.375 g (has no administration in time range)  piperacillin-tazobactam (ZOSYN) IVPB 3.375 g (has no administration in time range)  cefTRIAXone (ROCEPHIN) 1 g in sodium chloride 0.9 % 100 mL IVPB (0 g Intravenous Stopped 08/31/22 1818)  methylPREDNISolone sodium succinate (SOLU-MEDROL) 40 mg/mL injection 40 mg (40 mg Intravenous Given 08/31/22 1719)    Mobility walks     Focused Assessments N/A   R Recommendations: See Admitting Provider Note  Report given to:   Additional Notes: N/A

## 2022-09-01 ENCOUNTER — Encounter (HOSPITAL_COMMUNITY): Payer: Self-pay | Admitting: Internal Medicine

## 2022-09-01 ENCOUNTER — Telehealth: Payer: Self-pay | Admitting: Internal Medicine

## 2022-09-01 DIAGNOSIS — J690 Pneumonitis due to inhalation of food and vomit: Secondary | ICD-10-CM | POA: Diagnosis not present

## 2022-09-01 DIAGNOSIS — J3802 Paralysis of vocal cords and larynx, bilateral: Secondary | ICD-10-CM | POA: Diagnosis not present

## 2022-09-01 DIAGNOSIS — G35 Multiple sclerosis: Secondary | ICD-10-CM | POA: Diagnosis not present

## 2022-09-01 DIAGNOSIS — E871 Hypo-osmolality and hyponatremia: Secondary | ICD-10-CM | POA: Diagnosis not present

## 2022-09-01 DIAGNOSIS — J189 Pneumonia, unspecified organism: Secondary | ICD-10-CM

## 2022-09-01 DIAGNOSIS — J471 Bronchiectasis with (acute) exacerbation: Secondary | ICD-10-CM

## 2022-09-01 LAB — BLOOD CULTURE ID PANEL (REFLEXED) - BCID2

## 2022-09-01 LAB — COMPREHENSIVE METABOLIC PANEL
ALT: 13 U/L (ref 0–44)
AST: 17 U/L (ref 15–41)
Albumin: 2.6 g/dL — ABNORMAL LOW (ref 3.5–5.0)
Alkaline Phosphatase: 90 U/L (ref 38–126)
Anion gap: 11 (ref 5–15)
BUN: 16 mg/dL (ref 8–23)
CO2: 24 mmol/L (ref 22–32)
Calcium: 9.1 mg/dL (ref 8.9–10.3)
Chloride: 98 mmol/L (ref 98–111)
Creatinine, Ser: 0.66 mg/dL (ref 0.44–1.00)
GFR, Estimated: >60 mL/min (ref >60)
Glucose, Bld: 121 mg/dL — ABNORMAL HIGH (ref 70–99)
Potassium: 4.7 mmol/L (ref 3.5–5.1)
Sodium: 133 mmol/L — ABNORMAL LOW (ref 135–145)
Total Bilirubin: 0.5 mg/dL (ref 0.3–1.2)
Total Protein: 6.2 g/dL — ABNORMAL LOW (ref 6.5–8.1)

## 2022-09-01 LAB — RESPIRATORY PANEL BY PCR

## 2022-09-01 LAB — CBC
HCT: 32.7 % — ABNORMAL LOW (ref 36.0–46.0)
Hemoglobin: 10.4 g/dL — ABNORMAL LOW (ref 12.0–15.0)
MCH: 28.9 pg (ref 26.0–34.0)
MCHC: 31.8 g/dL (ref 30.0–36.0)
MCV: 90.8 fL (ref 80.0–100.0)
Platelets: 657 10*3/uL — ABNORMAL HIGH (ref 150–400)
RBC: 3.6 MIL/uL — ABNORMAL LOW (ref 3.87–5.11)
RDW: 14.7 % (ref 11.5–15.5)
WBC: 24.7 10*3/uL — ABNORMAL HIGH (ref 4.0–10.5)
nRBC: 0 % (ref 0.0–0.2)

## 2022-09-01 LAB — PREALBUMIN: Prealbumin: 12 mg/dL — ABNORMAL LOW (ref 18–38)

## 2022-09-01 LAB — PHOSPHORUS: Phosphorus: 4.1 mg/dL (ref 2.5–4.6)

## 2022-09-01 LAB — MAGNESIUM: Magnesium: 2.2 mg/dL (ref 1.7–2.4)

## 2022-09-01 LAB — CULTURE, BLOOD (ROUTINE X 2)

## 2022-09-01 MED ORDER — ORAL CARE MOUTH RINSE
15.0000 mL | OROMUCOSAL | Status: DC
  Administered 2022-09-01 – 2022-09-04 (×10): 15 mL via OROMUCOSAL

## 2022-09-01 MED ORDER — REVEFENACIN 175 MCG/3ML IN SOLN
175.0000 ug | Freq: Every day | RESPIRATORY_TRACT | Status: DC
  Administered 2022-09-01 – 2022-09-04 (×4): 175 ug via RESPIRATORY_TRACT
  Filled 2022-09-01 (×4): qty 3

## 2022-09-01 MED ORDER — PHENOL 1.4 % MT LIQD
1.0000 | OROMUCOSAL | Status: DC | PRN
  Administered 2022-09-01: 1 via OROMUCOSAL
  Filled 2022-09-01: qty 177

## 2022-09-01 MED ORDER — ENSURE ENLIVE PO LIQD
237.0000 mL | Freq: Two times a day (BID) | ORAL | Status: DC
  Administered 2022-09-01 – 2022-09-02 (×2): 237 mL via ORAL

## 2022-09-01 MED ORDER — BUDESONIDE 0.25 MG/2ML IN SUSP
0.2500 mg | Freq: Two times a day (BID) | RESPIRATORY_TRACT | Status: DC
  Administered 2022-09-01 – 2022-09-04 (×7): 0.25 mg via RESPIRATORY_TRACT
  Filled 2022-09-01 (×7): qty 2

## 2022-09-01 MED ORDER — ENOXAPARIN SODIUM 30 MG/0.3ML IJ SOSY
30.0000 mg | PREFILLED_SYRINGE | INTRAMUSCULAR | Status: DC
  Administered 2022-09-01 – 2022-09-04 (×4): 30 mg via SUBCUTANEOUS
  Filled 2022-09-01 (×4): qty 0.3

## 2022-09-01 MED ORDER — AZITHROMYCIN 250 MG PO TABS: 500.0000 mg | ORAL_TABLET | Freq: Every day | ORAL | Status: DC

## 2022-09-01 MED ORDER — LACTATED RINGERS IV SOLN: INTRAVENOUS | Status: DC

## 2022-09-01 MED ORDER — ARFORMOTEROL TARTRATE 15 MCG/2ML IN NEBU
15.0000 ug | INHALATION_SOLUTION | Freq: Two times a day (BID) | RESPIRATORY_TRACT | Status: DC
  Administered 2022-09-01 – 2022-09-04 (×7): 15 ug via RESPIRATORY_TRACT
  Filled 2022-09-01 (×8): qty 2

## 2022-09-01 MED ORDER — ALBUTEROL SULFATE (2.5 MG/3ML) 0.083% IN NEBU: 2.5000 mg | INHALATION_SOLUTION | RESPIRATORY_TRACT | Status: DC | PRN

## 2022-09-01 MED ORDER — SODIUM CHLORIDE 3 % IN NEBU
4.0000 mL | INHALATION_SOLUTION | Freq: Two times a day (BID) | RESPIRATORY_TRACT | Status: AC
  Administered 2022-09-01 – 2022-09-02 (×4): 4 mL via RESPIRATORY_TRACT
  Filled 2022-09-01 (×4): qty 4

## 2022-09-01 NOTE — Evaluation (Signed)
Occupational Therapy Evaluation Patient Details Name: Kayla [name] MRN: [id number] DOB: 1950/11/12 Today's Date: 09/01/2022   History of Present Illness 72 y.o. female with medical history significant of multiple sclerosis and recurrent pneumonia, COPD/asthma, GERD, hypothyroidism history of urinary retention. Presented with worsening shortness of breath.  Patient has history of multiple sclerosis and recurrent pneumonia had bronchoscopy done by pulmonology with cultures grew strep pneumo. Dx of aspiration pneumonia.   Clinical Impression   Patient evaluated by Occupational Therapy with no further acute OT needs identified. All education has been completed and the patient has no further questions. Patient and husband endorsed patient being at baseline at this time and not needing skilled OT services.  See below for any follow-up Occupational Therapy or equipment needs. OT is signing off. Thank you for this referral.       Recommendations for follow up therapy are one component of a multi-disciplinary discharge planning process, led by the attending physician.  Recommendations may be updated based on patient status, additional functional criteria and insurance authorization.   Assistance Recommended at Discharge Frequent or constant Supervision/Assistance  Patient can return home with the following A little help with walking and/or transfers;Assistance with cooking/housework;Direct supervision/assist for medications management;Assist for transportation;Help with stairs or ramp for entrance;Direct supervision/assist for financial management;A little help with bathing/dressing/bathroom    Functional Status Assessment  Patient has not had a recent decline in their functional status  Equipment Recommendations  None recommended by OT       Precautions / Restrictions Precautions Precautions: Fall Precaution Comments: fell in March sustained pelvic fx, denies other falls in past 6  months Restrictions Weight Bearing Restrictions: No      Mobility Bed Mobility Overal bed mobility: Needs Assistance Bed Mobility: Sit to Supine       Sit to supine: Min assist   General bed mobility comments: min A to guide feet back into bed.               Balance   Sitting-balance support: Feet supported, No upper extremity supported Sitting balance-Leahy Scale: Good Sitting balance - Comments: EOB                                   ADL either performed or assessed with clinical judgement   ADL Overall ADL's : At baseline         General ADL Comments: patient reported being at baseline for ADLs at this time and not needing OT. husband was present in room agreeing with patient. husband did report patients intake was low at this point. patient was encouraged to ask family to bring in food from home that fell into diet that she liked and also try five bite rule with each meal. patient verbalized understanding. patient positioned in bed with all needs within reach. BUE ROM WFL OT signing off at this time.                  Pertinent Vitals/Pain Pain Assessment Pain Assessment: No/denies pain     Hand Dominance Right   Extremity/Trunk Assessment Upper Extremity Assessment Upper Extremity Assessment: Defer to OT evaluation   Lower Extremity Assessment Lower Extremity Assessment: RLE deficits/detail RLE Deficits / Details: chronic R foot drop, knee ext 4/5 RLE Sensation: WNL   Cervical / Trunk Assessment Cervical / Trunk Assessment: Normal   Communication Communication Communication: Expressive difficulties   Cognition Arousal/Alertness: Awake/alert Behavior During Therapy:  WFL for tasks assessed/performed                    Home Living Family/patient expects to be discharged to:: Private residence Living Arrangements: Spouse/significant other Available Help at Discharge: Family;Available 24 hours/day Type of Home: House Home  Access: Stairs to enter [employer] of Steps: 4 Entrance Stairs-Rails: Right;Left Home Layout: One level     Bathroom Shower/Tub: Producer, television/film/video: Handicapped height     Home Equipment: Agricultural consultant (2 wheels);Shower seat;Grab bars - tub/shower   Additional Comments: uses bioness for R foot drop      Prior Functioning/Environment Prior Level of Function : Driving;Independent/Modified Independent             Mobility Comments: fell March 28 and sustained pelvic fx, walks with RW ADLs Comments: self catheterizes, 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Museum/gallery conservator goals can be found in the care plan section) Acute Rehab OT Goals OT Goal Formulation: All assessment and education complete, DC therapy  OT Frequency:         AM-PAC OT "6 Clicks" Daily Activity     Outcome Measure Help from another person eating meals?: None Help from another person taking care of personal grooming?: None Help from another person toileting, which includes using toliet, bedpan, or urinal?: None Help from another person bathing (including washing, rinsing, drying)?: None Help from another person to put on and taking off regular upper body clothing?: None Help from another person to put on and taking off regular lower body clothing?: None 6 Click Score: 24   End of Session Nurse Communication: Other (comment) (ok to participate in session, updated on session afterwards)  Activity Tolerance: Patient tolerated treatment well Patient left: in bed;with call bell/phone within reach;with bed alarm set;with family/visitor present                   Time: 1610-9604 OT Time Calculation (min): 15 min Charges:  OT General Charges $OT Visit: 1 Visit OT Evaluation $OT Eval Low Complexity: 1 Low  Anuar Walgren OTR/L, MS Acute Rehabilitation Department Office# [phone]   Selinda Flavin 09/01/2022, 4:00 PM

## 2022-09-01 NOTE — Progress Notes (Signed)
Pt daughter called and filled out online questionnaire asking for information about hospice services verses palliative care services. Upon returning call to speak with the daughter Kaaren Nass [phone]. She updated that the pt has been admitted to [facility]. We have explained that when in hospital she will need to express her desire for this to the hospital staff in order for them to make referral and allow [place] to come in and speak to them about these services.   If we are needed to assist with these converstaion please do not hesitate to reach out. Norm Parcel RN [phone]

## 2022-09-01 NOTE — Evaluation (Signed)
Physical Therapy Evaluation Patient Details Name: Kayla [name] MRN: [id number] DOB: Sep 21, 1950 Today's Date: 09/01/2022  History of Present Illness  72 y.o. female with medical history significant of multiple sclerosis and recurrent pneumonia, COPD/asthma, GERD, hypothyroidism history of urinary retention. Presented with worsening shortness of breath.  Patient has history of multiple sclerosis and recurrent pneumonia had bronchoscopy done by pulmonology with cultures grew strep pneumo. Dx of aspiration pneumonia.  Clinical Impression  Pt admitted with above diagnosis. Pt ambulated 4' with RW, no loss of balance, distance limited by fatigue. Pt reports she went to OPPT prior to hospital admission, she plans to resume this upon acute DC.  Pt currently with functional limitations due to the deficits listed below (see PT Problem List). Pt will benefit from acute skilled PT to increase their independence and safety with mobility to allow discharge.          Recommendations for follow up therapy are one component of a multi-disciplinary discharge planning process, led by the attending physician.  Recommendations may be updated based on patient status, additional functional criteria and insurance authorization.  Follow Up Recommendations       Assistance Recommended at Discharge Intermittent Supervision/Assistance  Patient can return home with the following  A little help with bathing/dressing/bathroom;Assistance with cooking/housework;Assist for transportation;Help with stairs or ramp for entrance    Equipment Recommendations None recommended by PT  Recommendations for Other Services       Functional Status Assessment Patient has had a recent decline in their functional status and demonstrates the ability to make significant improvements in function in a reasonable and predictable amount of time.     Precautions / Restrictions Precautions Precautions: Fall Precaution Comments: fell in  March sustained pelvic fx, denies other falls in past 6 months Restrictions Weight Bearing Restrictions: No      Mobility  Bed Mobility Overal bed mobility: Needs Assistance Bed Mobility: Supine to Sit     Supine to sit: Min assist, HOB elevated     General bed mobility comments: min A to raise trunk, used rail    Transfers Overall transfer level: Needs assistance Equipment used: Rolling walker (2 wheels) Transfers: Sit to/from Stand Sit to Stand: Min guard           General transfer comment: min/guard for safety    Ambulation/Gait Ambulation/Gait assistance: Min guard Gait Distance (Feet): 18 Feet Assistive device: Rolling walker (2 wheels) Gait Pattern/deviations: Step-through pattern, Decreased step length - right, Decreased step length - left Gait velocity: decr     General Gait Details: steady, no loss of balance, distance limited by fatigue, pulse oximeter did not get a good signal during ambulation; SpO2 100% on room air after ~2 minutes of seated rest  Stairs            Wheelchair Mobility    Modified Rankin (Stroke Patients Only)       Balance Overall balance assessment: History of Falls, Needs assistance Sitting-balance support: Feet supported, No upper extremity supported Sitting balance-Leahy Scale: Good     Standing balance support: Bilateral upper extremity supported, During functional activity, Reliant on assistive device for balance Standing balance-Leahy Scale: Poor                               Pertinent Vitals/Pain Pain Assessment Pain Assessment: No/denies pain Pain Score: 0-No pain    Home Living Family/patient expects to be discharged to:: Private residence Living Arrangements:  Spouse/significant other Available Help at Discharge: Family;Available 24 hours/day Type of Home: House Home Access: Stairs to enter Entrance Stairs-Rails: Doctor, general practice of Steps: 4   Home Layout: One  level Home Equipment: Agricultural consultant (2 wheels);Shower seat;Grab bars - tub/shower Additional Comments: uses bioness for R foot drop    Prior Function Prior Level of Function : Driving;Independent/Modified Independent             Mobility Comments: fell March 28 and sustained pelvic fx, walks with RW ADLs Comments: self catheterizes, 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[employer], quantity Dominance   Dominant Hand: Right    Extremity/Trunk Assessment   Upper Extremity Assessment Upper Extremity Assessment: Defer to OT evaluation    Lower Extremity Assessment Lower Extremity Assessment: RLE deficits/detail RLE Deficits / Details: chronic R foot drop, knee ext 4/5 RLE Sensation: WNL    Cervical / Trunk Assessment Cervical / Trunk Assessment: Normal  Communication   Communication: Expressive difficulties  Cognition Arousal/Alertness: Awake/alert Behavior During Therapy: WFL for tasks assessed/performed Overall Cognitive Status: Within Functional Limits for tasks assessed                                          General Comments      Exercises     Assessment/Plan    PT Assessment Patient needs continued PT services  PT Problem List Decreased activity tolerance;Decreased balance;Decreased mobility;Pain;Decreased strength       PT Treatment Interventions Gait training;Therapeutic exercise;Functional mobility training;Therapeutic activities;Patient/family education    PT Goals (Current goals can be found in the Care Plan section)  Acute Rehab PT Goals Patient Stated Goal: likes going to the beach PT Goal Formulation: With patient/family Time For Goal Achievement: 09/15/22 Potential to Achieve Goals: Good    Frequency Min 1X/week     Co-evaluation               AM-PAC PT "6 Clicks" Mobility  Outcome Measure Help needed turning from your back to your side while in a flat bed without using bedrails?: A Little Help needed  moving from lying on your back to sitting on the side of a flat bed without using bedrails?: A Little Help needed moving to and from a bed to a chair (including a wheelchair)?: A Little Help needed standing up from a chair using your arms (e.g., wheelchair or bedside chair)?: A Little Help needed to walk in hospital room?: A Little Help needed climbing 3-5 steps with a railing? : A Lot 6 Click Score: 17    End of Session Equipment Utilized During Treatment: Gait belt Activity Tolerance: Patient tolerated treatment well Patient left: in bed;with call bell/phone within reach;with bed alarm set;with family/visitor present Nurse Communication: Mobility status PT Visit Diagnosis: History of falling (Z91.81);Difficulty in walking, not elsewhere classified (R26.2);Other abnormalities of gait and mobility (R26.89)    Time: 1610-9604 PT Time Calculation (min) (ACUTE ONLY): 20 min   Charges:   PT Evaluation $PT Eval Moderate Complexity: 1 Mod         Tamala Ser PT 09/01/2022  Acute Rehabilitation Services  Office [phone]

## 2022-09-01 NOTE — Evaluation (Signed)
Clinical/Bedside Swallow Evaluation Patient Details  Name: Kayla [name] MRN: [id number] Date of Birth: 12-Dec-1950  Today's Date: 09/01/2022 Time: SLP Start Time (ACUTE ONLY): 1005 SLP Stop Time (ACUTE ONLY): 1020 SLP Time Calculation (min) (ACUTE ONLY): 15 min  Past Medical History:  Past Medical History:  Diagnosis Date   Arthritis    oa   Asthma    Bilateral vocal cord paralysis    Bronchiectasis (HCC)    Cervical cancer (HCC) 2005   Depression    GERD (gastroesophageal reflux disease)    Hiatal hernia    Hypothyroidism    Immunodeficiency (HCC)    Gets IVIG infusions   Insomnia    Multiple sclerosis (HCC) 1996   Osteoporosis    Pneumonia 2024, 2014, 1992   Urine retention    Past Surgical History:  Past Surgical History:  Procedure Laterality Date   ABDOMINAL HYSTERECTOMY  03/25/2003   for cervical cancer, complete   BRONCHIAL BIOPSY  10/26/2020   Procedure: BRONCHIAL BIOPSIES;  Surgeon: Josephine Igo, DO;  Location: [facility];  Service: Pulmonary;;   BRONCHIAL BRUSHINGS  10/26/2020   Procedure: BRONCHIAL BRUSHINGS;  Surgeon: Josephine Igo, DO;  Location: [facility];  Service: Pulmonary;;   BRONCHIAL WASHINGS  10/26/2020   Procedure: BRONCHIAL WASHINGS;  Surgeon: Josephine Igo, DO;  Location: [facility];  Service: Pulmonary;;   BRONCHIAL WASHINGS  08/19/2022   Procedure: BRONCHIAL WASHINGS;  Surgeon: Josephine Igo, DO;  Location: [facility];  Service: Cardiopulmonary;;   CATARACT EXTRACTION, BILATERAL Bilateral    lens for cataracts   CONVERSION TO TOTAL HIP Right 09/18/2015   Procedure: CONVERSION OF RIGHT HEMI ARTHROPLASTY TO TOTAL HIP ARTHROPLASTY ACETABULAR REVISON ;  Surgeon: Durene Romans, MD;  Location: [facility];  Service: Orthopedics;  Laterality: Right;   HIP ARTHROPLASTY Right 03/01/2013   Procedure: ARTHROPLASTY BIPOLAR HIP;  Surgeon: Shelda Pal, MD;  Location: [facility];  Service: Orthopedics;  Laterality: Right;   HIP CLOSED REDUCTION  Right 03/12/2013   Procedure: CLOSED REDUCTION HIP;  Surgeon: [name], MD;  Location: [facility];  Service: Orthopedics;  Laterality: Right;   HIP CLOSED REDUCTION Right 03/19/2013   Procedure: CLOSED REDUCTION HIP;  Surgeon: Javier Docker, MD;  Location: [facility];  Service: Orthopedics;  Laterality: Right;   HIP CLOSED REDUCTION Right 11/10/2015   Procedure: CLOSED MANIPULATION HIP;  Surgeon: Samson Frederic, MD;  Location: [facility];  Service: Orthopedics;  Laterality: Right;   HIP CLOSED REDUCTION Right 06/05/2017   Procedure: CLOSED REDUCTION HIP;  Surgeon: Ranee Gosselin, MD;  Location: [facility];  Service: Orthopedics;  Laterality: Right;   SMALL INTESTINE SURGERY     TOTAL HIP REVISION Right 06/06/2017   Procedure: TOTAL HIP REVISION;  Surgeon: Ranee Gosselin, MD;  Location: [facility];  Service: Orthopedics;  Laterality: Right;   VESICOVAGINAL FISTULA CLOSURE W/ TAH  03/25/2003   VIDEO BRONCHOSCOPY N/A 10/26/2020   Procedure: VIDEO BRONCHOSCOPY WITH FLUORO;  Surgeon: Josephine Igo, DO;  Location: [facility];  Service: Pulmonary;  Laterality: N/A;   VIDEO BRONCHOSCOPY Left 08/19/2022   Procedure: VIDEO BRONCHOSCOPY WITH FLUORO; WITH BAL;  Surgeon: Josephine Igo, DO;  Location: [facility];  Service: Cardiopulmonary;  Laterality: Left;   HPI:  Patient is a 72 y.o. female with PMH: Multiple Sclerosis, recurrent PNA, COPD/asthma, GERD, hiatal hernia, hypothyroidism, bilateral vocal cord paralysis, depression, cervical cancer and h/o UTI. MBS completed in July of 2022 did not show any aspiration of liquids or solids  but did show pharyngeal retention of solids which decreased with subsequent swallows. She presented to the hospital on 08/31/22 with worsening SOB. She had brochoscopy done by pulmonology recently and was started on Augmentin due to cultures growing strep PNA. She has been off of her IVIG for approximately 3 months. CT chest from 08/31/22 showed debris in RLL bronchus, b/l lower lobe  bronchial wall thickening with multifocal mucus plugging, scattered areas of GGO b/l, RLL area of consolidation. MD started h her on PO diet of Dys 3 (mechanical soft) solids and nectar thick liquids.    Assessment / Plan / Recommendation  Clinical Impression  Patient is presenting with clinical s/s of dysphagia as per this bedside swallow evaluation, however per her and her husband's report, her swallow function does not appear any different from baseline. When SLP arrived, patient eating some of her breakfast meal tray. No overt s/s aspiration observed immediately after sips of thin liquids (water), nectar thick liquids or bites of puree (yogurt) but patient did swallow twice and appear to have to put forth more effort when swallowing. She had a delayed cough which persisted for a few minutes, however this is her baseline. Per patient's spouse, they consulted with ENT (Dr. Harriette Ohara) and discussed options for treatment of her chronic cough and bilateral vocal fold paralysis which included: tracheostomy, posterior cordotomy, observation only, or medical speech therapy evaluation to assess her candidacy for laryngeal hygiene and cough supression therapy. Patient and spouse chose speech therapy over the surgical options and she did attend a couple sessions with SLP for this. Currently, spouse reports they "accept it" meaning they know there are no good options to cure her chronic coughing.SLP is currently recommending advance diet to regular solids, thin liquids as patient appears to be at baseline with swallow function and in addition, she and spouse are both capable of ordering foods she can tolerate. SLP will f/u at least one more time to ensure diet toleration and determine if need for MBS. SLP Visit Diagnosis: Dysphagia, unspecified (R13.10)    Aspiration Risk  Mild aspiration risk    Diet Recommendation Regular;Thin liquid   Liquid Administration via: Cup;Straw Medication Administration: Other  (Comment) (as tolerated) Supervision: Patient able to self feed Compensations: Slow rate;Small sips/bites Postural Changes: Seated upright at 90 degrees;Remain upright for at least 30 minutes after po intake    Other  Recommendations Oral Care Recommendations: Oral care BID;Patient independent with oral care    Recommendations for follow up therapy are one component of a multi-disciplinary discharge planning process, led by the attending physician.  Recommendations may be updated based on patient status, additional functional criteria and insurance authorization.  Follow up Recommendations Follow physician's recommendations for discharge plan and follow up therapies      Assistance Recommended at Discharge    Functional Status Assessment Patient has had a recent decline in their functional status and demonstrates the ability to make significant improvements in function in a reasonable and predictable amount of time.  Frequency and Duration min 1 x/week  1 week       Prognosis Prognosis for improved oropharyngeal function: Good      Swallow Study   General Date of Onset: 08/31/22 HPI: Patient is a 72 y.o. female with PMH: Multiple Sclerosis, recurrent PNA, COPD/asthma, GERD, hiatal hernia, hypothyroidism, bilateral vocal cord paralysis, depression, cervical cancer and h/o UTI. MBS completed in July of 2022 did not show any aspiration of liquids or solids but did show pharyngeal retention of  solids which decreased with subsequent swallows. She presented to the hospital on 08/31/22 with worsening SOB. She had brochoscopy done by pulmonology recently and was started on Augmentin due to cultures growing strep PNA. She has been off of her IVIG for approximately 3 months. CT chest from 08/31/22 showed debris in RLL bronchus, b/l lower lobe bronchial wall thickening with multifocal mucus plugging, scattered areas of GGO b/l, RLL area of consolidation. MD started h her on PO diet of Dys 3 (mechanical  soft) solids and nectar thick liquids. Type of Study: Bedside Swallow Evaluation Previous Swallow Assessment: most recent was 2022 MBS Diet Prior to this Study: Dysphagia 3 (mechanical soft);Mildly thick liquids (Level 2, nectar thick) Temperature Spikes Noted: No Respiratory Status: Nasal cannula History of Recent Intubation: No Behavior/Cognition: Alert;Cooperative;Pleasant mood Oral Cavity Assessment: Within Functional Limits Oral Care Completed by SLP: No Oral Cavity - Dentition: Adequate natural dentition Vision: Functional for self-feeding Self-Feeding Abilities: Able to feed self Patient Positioning: Upright in bed Baseline Vocal Quality: Hoarse    Oral/Motor/Sensory Function Overall Oral Motor/Sensory Function: Within functional limits   Ice Chips     Thin Liquid Thin Liquid: Impaired Pharyngeal  Phase Impairments: Multiple swallows    Nectar Thick     Honey Thick     Puree Puree: Impaired Pharyngeal Phase Impairments: Multiple swallows   Solid     Solid: Not tested     Angela Nevin, MA, CCC-SLP Speech Therapy'

## 2022-09-01 NOTE — Telephone Encounter (Signed)
PT is at [facility] for pneumonia. FYI Dr. Marchelle Gearing.

## 2022-09-01 NOTE — Progress Notes (Signed)
PHARMACY - PHYSICIAN COMMUNICATION CRITICAL VALUE ALERT - BLOOD CULTURE IDENTIFICATION (BCID)  PURITY Kayla [name] is an 72 y.o. female with hx bronchiectasis and asthma who presented to [facility] on 08/31/2022 with a chief complaint of SOB. Chest Ct showed findings with concern for bilateral lower lobe PNA.  He's currently on zosyn for PNA.  One of four blood culture bottles collected on 08/31/22 resulted back with GPC in pairs (BCID= strep pneumo).   Name of physician (or Provider) Contacted: Dr. Sharl Ma   Current antibiotics: zosyn   Changes to prescribed antibiotics recommended:  - Per Dr. Sharl Ma, continue zosyn for now.  Results for orders placed or performed during the hospital encounter of 08/31/22  Blood Culture ID Panel (Reflexed) (Collected: 08/31/2022  5:03 PM)  Result Value Ref Range   Enterococcus faecalis NOT DETECTED NOT DETECTED   Enterococcus Faecium NOT DETECTED NOT DETECTED   Listeria monocytogenes NOT DETECTED NOT DETECTED   Staphylococcus species NOT DETECTED NOT DETECTED   Staphylococcus aureus (BCID) NOT DETECTED NOT DETECTED   Staphylococcus epidermidis NOT DETECTED NOT DETECTED   Staphylococcus lugdunensis NOT DETECTED NOT DETECTED   Streptococcus species DETECTED (A) NOT DETECTED   Streptococcus agalactiae NOT DETECTED NOT DETECTED   Streptococcus pneumoniae DETECTED (A) NOT DETECTED   Streptococcus pyogenes NOT DETECTED NOT DETECTED   A.calcoaceticus-baumannii NOT DETECTED NOT DETECTED   Bacteroides fragilis NOT DETECTED NOT DETECTED   Enterobacterales NOT DETECTED NOT DETECTED   Enterobacter cloacae complex NOT DETECTED NOT DETECTED   Escherichia coli NOT DETECTED NOT DETECTED   Klebsiella aerogenes NOT DETECTED NOT DETECTED   Klebsiella oxytoca NOT DETECTED NOT DETECTED   Klebsiella pneumoniae NOT DETECTED NOT DETECTED   Proteus species NOT DETECTED NOT DETECTED   Salmonella species NOT DETECTED NOT DETECTED   Serratia marcescens NOT DETECTED NOT DETECTED    Haemophilus influenzae NOT DETECTED NOT DETECTED   Neisseria meningitidis NOT DETECTED NOT DETECTED   Pseudomonas aeruginosa NOT DETECTED NOT DETECTED   Stenotrophomonas maltophilia NOT DETECTED NOT DETECTED   Candida albicans NOT DETECTED NOT DETECTED   Candida auris NOT DETECTED NOT DETECTED   Candida glabrata NOT DETECTED NOT DETECTED   Candida krusei NOT DETECTED NOT DETECTED   Candida parapsilosis NOT DETECTED NOT DETECTED   Candida tropicalis NOT DETECTED NOT DETECTED   Cryptococcus neoformans/gattii NOT DETECTED NOT DETECTED    Kayla [name] 09/01/2022  2:41 PM

## 2022-09-01 NOTE — Progress Notes (Signed)
[facility]  PROGRESS NOTE  Kayla [name] YNW:[id number] DOB: 18-Dec-1950 DOA: 08/31/2022 PCP: [name], MD   Brief HPI:   female with medical history significant of multiple sclerosis and recurrent pneumonia, COPD/asthma, GERD, hypothyroidism history of urinary retention, presented with worsening shortness of breath.  Patient has history of multiple sclerosis and recurrent pneumonia, recently had bronchoscopy done by pulmonology at that time bronchial washing grew pneumococcus and she was treated with Augmentin.  She has been off IVIG for 3 months after she was admitted for recruitments fracture. CT chest on May 20 showed mucoid impaction and changes suggestive of episode of aspiration pneumonia as well as cylindrical bronchiectasis. CT chest obtained yesterday was negative for PE, showed scattered groundglass opacities in the lungs bilaterally.  Bronchial wall thickening in the lower lobes bilaterally with debris in the right lower lobe bronchus and multifocal mucous plugging and consolidation of the right lung base concerning for pneumonia.    Assessment/Plan:   Bilateral pneumonia -Seen on CT chest; procalcitonin 1.62 -Respiratory panel positive for rhinovirus -Blood cultures positive for strep pneumoniae -Antibiotics changed to IV Zosyn  Bronchiectasis/asthma -Continue Yupelri, Brovana, Pulmicort -PCCM following  Hypothyroidism -Continue Synthroid  Immunodeficiency/history of multiple sclerosis -Follow-up allergy immunology/neurology as outpatient -Patient was on IVIG which has been on hold for past 3 months  Urinary retention -Continue as needed In-N-Out cath  Bilateral vocal cord paralysis -Contributing to patient's aspiration and coughing  Hyponatremia -Mild; follow serum sodium in a.m.  Constipation -KUB showed moderate stool burden -Continue bowel regimen     Medications     arformoterol  15 mcg Nebulization BID   budesonide (PULMICORT)  nebulizer solution  0.25 mg Nebulization BID   docusate sodium  100 mg Oral BID   enoxaparin (LOVENOX) injection  30 mg Subcutaneous Q24H   feeding supplement  237 mL Oral BID BM   FLUoxetine  40 mg Oral Daily   guaiFENesin  600 mg Oral BID   levothyroxine  100 mcg Oral Q0600   mirabegron ER  25 mg Oral Daily   mouth rinse  15 mL Mouth Rinse 4 times per day   pantoprazole  40 mg Oral Daily   revefenacin  175 mcg Nebulization Daily   senna  1 tablet Oral BID   sodium chloride HYPERTONIC  4 mL Nebulization BID   tamsulosin  0.4 mg Oral Daily   temazepam  15 mg Oral QHS     Data Reviewed:   CBG:  No results for input(s): "GLUCAP" in the last 168 hours.  SpO2: 100 % O2 Flow Rate (L/min): 3 L/min    Vitals:   09/01/22 0901 09/01/22 0905 09/01/22 1155 09/01/22 1157  BP:    (!) 110/59  Pulse:   84 82  Resp:   20 (!) 25  Temp:    98.2 F (36.8 C)  TempSrc:    Oral  SpO2: 100% 100% 100% 100%  Weight:      Height:          Data Reviewed:  Basic Metabolic Panel: Recent Labs  Lab 08/31/22 1614 08/31/22 2131 09/01/22 0406  NA 132*  --  133*  K 4.3  --  4.7  CL 98  --  98  CO2 22  --  24  GLUCOSE 92  --  121*  BUN 14  --  16  CREATININE 0.70  --  0.66  CALCIUM 9.2  --  9.1  MG  --  2.3 2.2  PHOS  --  3.7 4.1    CBC: Recent Labs  Lab 08/31/22 1614 09/01/22 0406  WBC 34.2* 24.7*  NEUTROABS 30.9*  --   HGB 11.7* 10.4*  HCT 36.3 32.7*  MCV 90.3 90.8  PLT 623* 657*    LFT Recent Labs  Lab 08/31/22 1614 09/01/22 0406  AST 16 17  ALT 13 13  ALKPHOS 87 90  BILITOT 0.5 0.5  PROT 6.5 6.2*  ALBUMIN 2.9* 2.6*     Antibiotics: Anti-infectives (From admission, onward)    Start     Dose/Rate Route Frequency Ordered Stop   09/01/22 1800  azithromycin (ZITHROMAX) tablet 500 mg  Status:  Discontinued        500 mg Oral Daily-1800 09/01/22 0903 09/01/22 1309   09/01/22 1000  azithromycin (ZITHROMAX) 500 mg in sodium chloride 0.9 % 250 mL IVPB  Status:   Discontinued        500 mg 250 mL/hr over 60 Minutes Intravenous Every 24 hours 08/31/22 1851 09/01/22 0903   09/01/22 0200  piperacillin-tazobactam (ZOSYN) IVPB 3.375 g        3.375 g 12.5 mL/hr over 240 Minutes Intravenous Every 8 hours 08/31/22 1901     08/31/22 1900  piperacillin-tazobactam (ZOSYN) IVPB 3.375 g        3.375 g 100 mL/hr over 30 Minutes Intravenous  Once 08/31/22 1859     08/31/22 1845  azithromycin (ZITHROMAX) 500 mg in sodium chloride 0.9 % 250 mL IVPB        500 mg 250 mL/hr over 60 Minutes Intravenous  Once 08/31/22 1833 09/01/22 0743   08/31/22 1645  cefTRIAXone (ROCEPHIN) 1 g in sodium chloride 0.9 % 100 mL IVPB        1 g 200 mL/hr over 30 Minutes Intravenous  Once 08/31/22 1634 08/31/22 1818        DVT prophylaxis: Lovenox  Code Status: Full code  Family Communication: Discussed with patient's husband at bedside   CONSULTS    Subjective   Patient seen and examined, denies shortness of breath.   Objective    Physical Examination:   General-appears in no acute distress Heart-S1-S2, regular, no murmur auscultated Lungs-decreased breath sounds at lung bases Abdomen-soft, nontender, no organomegaly Extremities-no edema in the lower extremities Neuro-alert, oriented x3, no focal deficit noted  Status is: Inpatient:             Meredeth Ide   [facility] If 7PM-7AM, please contact night-coverage at [email or web address], Office  [phone]   09/01/2022, 2:20 PM  LOS: 1 day

## 2022-09-01 NOTE — Consult Note (Addendum)
NAME:  Kayla [name], MRN:  [id number], DOB:  01-05-1951, LOS: 1 ADMISSION DATE:  08/31/2022, CONSULTATION DATE:  09/01/2022 REFERRING MD:  Dr. Adela Glimpse, [facility], CHIEF COMPLAINT:  short of breathing   History of Present Illness:  72 yo female followed by Dr. Marchelle Gearing in the pulmonary clinic for bronchiectasis and asthma with chronic cough was found to have worsening Lt lower lobe infiltrate at visit on 08/12/22.  She had bronchoscopy with bilateral washings of the mainstem bronchi, BAL of Lt and Rt lower lobes.  BAL was positive for pneumococcus and treated with augmentin.  She has a history of immunodeficiency on IVIG, but this was stopped early in 2024.  She presented to [facility] ER on 6/09 with progressive dyspnea and wheezing.  She was admitted for aspiration pneumonia.  Respiratory viral panel positive for rhinovirus.  PCCM consulted to assist with respiratory management.  Pertinent  Medical History  Multiple sclerosis, Hiatal hernia, Bilateral vocal cord immobility, Pneumonia, Immunodeficiency on IVIG, Hypothyroidism, Urine retention, GERD, Cervical cancer, Depression, Insomnia  Significant Hospital Events: Including procedures, antibiotic start and stop dates in addition to other pertinent events   6/09 Admit  Studies:  Ig levels 12/22/19 >> IgA 51, IgG 289, IgM 27 (all low) PFT 10/24/21 >> FEV1 1.59 (72%), FEV1% 71, TLC 4.92 (100%), DLCO 60%, +BD CT chest 08/11/22 >> apical scarring, cylindrical BTX more in lower lobes, mucoid impaction and peribronchovascular nodularity in lower lobes Lt > Rt CT angio chest 08/31/22 >> debris in RLL bronchus, b/l lower lobe bronchial wall thickening with multifocal mucus plugging, scattered areas of GGO b/l, RLL area of consolidation  Interim History / Subjective:  She has persistent cough.  Brings up yellow sputum.  Denies fever or hemoptysis,  chest sore from coughing.  Has chronic hoarseness of voice.    Objective   Blood pressure 107/80, pulse 95,  temperature 98.2 F (36.8 C), temperature source Oral, resp. rate 20, height 5\' 3"  (1.6 m), weight 45.4 kg, SpO2 100 %.        Intake/Output Summary (Last 24 hours) at 09/01/2022 2130 Last data filed at 09/01/2022 0340 Gross per 24 hour  Intake 261.42 ml  Output 200 ml  Net 61.42 ml   Filed Weights   08/31/22 1443  Weight: 45.4 kg    Examination:  General - alert, thin Eyes - pupils reactive ENT - no sinus tenderness, no stridor, decreased voice quality Cardiac - regular rate/rhythm, no murmur Chest - bilateral inspiratory squeaks and crackles, more at bases Abdomen - soft, non tender, + bowel sounds Extremities - no cyanosis, clubbing, or edema Skin - no rashes Neuro - normal strength, moves extremities, follows commands Psych - normal mood and behavior  Resolved Hospital Problem list     Assessment & Plan:   Bilateral lower lobe community acquired pneumonia. - from Streptococcus pneumonia and Rhinovirus - day 2 of Abx  Acute exacerbation of bronchiectasis and asthma. - add yupelri, brovana, pulmicort - hypertonic saline nebulizer bid for 2 days - prn albuterol - flutter valve bid, chest percussion - mucinex bid - will need to arrange for chest percussion vest as an outpt  Acute hypoxic respiratory failure. - goal SpO2 > 92%  Immunodeficiency. - had to stop IVIG therapy in March 2024 after she was hospitalized for pubic ramus fracture - followed by Dr. Loralyn Freshwater with [facility] in [place], [place] - will need follow up as outpt to assess for resumption of IVIG therapy  Bilateral vocal cord cord  immobility with small trans-glottal airway. - followed by Dr. Harriette Ohara with ENT at [facility] - speech therapy  Multiple sclerosis. - followed by Dr. Tyron Russell with neurology in [facility] (right click and "Reselect all SmartList Selections" daily)   Diet/type: dysphagia diet (see orders) DVT  prophylaxis: LMWH GI prophylaxis: PPI Lines: N/A Foley:  N/A Code Status:  full code Last date of multidisciplinary goals of care discussion [x]   Labs   CBC: Recent Labs  Lab 08/31/22 1614 09/01/22 0406  WBC 34.2* 24.7*  NEUTROABS 30.9*  --   HGB 11.7* 10.4*  HCT 36.3 32.7*  MCV 90.3 90.8  PLT 623* 657*    Basic Metabolic Panel: Recent Labs  Lab 08/31/22 1614 08/31/22 2131 09/01/22 0406  NA 132*  --  133*  K 4.3  --  4.7  CL 98  --  98  CO2 22  --  24  GLUCOSE 92  --  121*  BUN 14  --  16  CREATININE 0.70  --  0.66  CALCIUM 9.2  --  9.1  MG  --  2.3 2.2  PHOS  --  3.7 4.1   GFR: Estimated Creatinine Clearance: 46.9 mL/min (by C-G formula based on SCr of 0.66 mg/dL). Recent Labs  Lab 08/31/22 1614 08/31/22 2131 09/01/22 0406  PROCALCITON  --  1.62  --   WBC 34.2*  --  24.7*  LATICACIDVEN  --  1.4  --     Liver Function Tests: Recent Labs  Lab 08/31/22 1614 09/01/22 0406  AST 16 17  ALT 13 13  ALKPHOS 87 90  BILITOT 0.5 0.5  PROT 6.5 6.2*  ALBUMIN 2.9* 2.6*   No results for input(s): "LIPASE", "AMYLASE" in the last 168 hours. No results for input(s): "AMMONIA" in the last 168 hours.  ABG    Component Value Date/Time   PHART 7.450 09/29/2020 0445   PCO2ART 35.4 09/29/2020 0445   PO2ART 77.6 (L) 09/29/2020 0445   HCO3 28.5 (H) 08/31/2022 1810   TCO2 26 01/07/2017 1617   ACIDBASEDEF 5.3 (H) 02/24/2013 1625   O2SAT 73.3 08/31/2022 1810     Coagulation Profile: Recent Labs  Lab 08/31/22 1614  INR 1.1    Cardiac Enzymes: Recent Labs  Lab 08/31/22 2131  CKTOTAL 33*    HbA1C: Hgb A1c MFr Bld  Date/Time Value Ref Range Status  02/25/2013 08:35 AM 5.6 <5.7 % Final    Comment:    (NOTE)                                                                       According to the ADA Clinical Practice Recommendations for 2011, when HbA1c is used as a screening test:  >=6.5%   Diagnostic of Diabetes Mellitus           (if abnormal result  is confirmed) 5.7-6.4%   Increased risk of developing Diabetes Mellitus References:Diagnosis and Classification of Diabetes Mellitus,Diabetes Care,2011,34(Suppl 1):S62-S69 and Standards of Medical Care in         Diabetes - 2011,Diabetes Care,2011,34 (Suppl 1):S11-S61.    CBG: No results for input(s): "GLUCAP" in the last 168 hours.  Review of Systems:   Reviewed and negative  Past Medical History:  She,  has a past medical history of Arthritis, Asthma, Bilateral vocal cord paralysis, Bronchiectasis (HCC), Cervical cancer (HCC) (2005), Depression, GERD (gastroesophageal reflux disease), Hiatal hernia, Hypothyroidism, Immunodeficiency (HCC), Insomnia, Multiple sclerosis (HCC) (1996), Osteoporosis, Pneumonia (2024, 2014, 1992), and Urine retention.   Surgical History:   Past Surgical History:  Procedure Laterality Date   ABDOMINAL HYSTERECTOMY  03/25/2003   for cervical cancer, complete   BRONCHIAL BIOPSY  10/26/2020   Procedure: BRONCHIAL BIOPSIES;  Surgeon: Josephine Igo, DO;  Location: [facility];  Service: Pulmonary;;   BRONCHIAL BRUSHINGS  10/26/2020   Procedure: BRONCHIAL BRUSHINGS;  Surgeon: Josephine Igo, DO;  Location: [facility];  Service: Pulmonary;;   BRONCHIAL WASHINGS  10/26/2020   Procedure: BRONCHIAL WASHINGS;  Surgeon: Josephine Igo, DO;  Location: [facility];  Service: Pulmonary;;   BRONCHIAL WASHINGS  08/19/2022   Procedure: BRONCHIAL WASHINGS;  Surgeon: Josephine Igo, DO;  Location: [facility];  Service: Cardiopulmonary;;   CATARACT EXTRACTION, BILATERAL Bilateral    lens for cataracts   CONVERSION TO TOTAL HIP Right 09/18/2015   Procedure: CONVERSION OF RIGHT HEMI ARTHROPLASTY TO TOTAL HIP ARTHROPLASTY ACETABULAR REVISON ;  Surgeon: Durene Romans, MD;  Location: [facility];  Service: Orthopedics;  Laterality: Right;   HIP ARTHROPLASTY Right 03/01/2013   Procedure: ARTHROPLASTY BIPOLAR HIP;  Surgeon: Shelda Pal, MD;  Location: [facility];  Service:  Orthopedics;  Laterality: Right;   HIP CLOSED REDUCTION Right 03/12/2013   Procedure: CLOSED REDUCTION HIP;  Surgeon: [name], MD;  Location: [facility];  Service: Orthopedics;  Laterality: Right;   HIP CLOSED REDUCTION Right 03/19/2013   Procedure: CLOSED REDUCTION HIP;  Surgeon: Javier Docker, MD;  Location: [facility];  Service: Orthopedics;  Laterality: Right;   HIP CLOSED REDUCTION Right 11/10/2015   Procedure: CLOSED MANIPULATION HIP;  Surgeon: Samson Frederic, MD;  Location: [facility];  Service: Orthopedics;  Laterality: Right;   HIP CLOSED REDUCTION Right 06/05/2017   Procedure: CLOSED REDUCTION HIP;  Surgeon: Ranee Gosselin, MD;  Location: [facility];  Service: Orthopedics;  Laterality: Right;   SMALL INTESTINE SURGERY     TOTAL HIP REVISION Right 06/06/2017   Procedure: TOTAL HIP REVISION;  Surgeon: Ranee Gosselin, MD;  Location: [facility];  Service: Orthopedics;  Laterality: Right;   VESICOVAGINAL FISTULA CLOSURE W/ TAH  03/25/2003   VIDEO BRONCHOSCOPY N/A 10/26/2020   Procedure: VIDEO BRONCHOSCOPY WITH FLUORO;  Surgeon: Josephine Igo, DO;  Location: [facility];  Service: Pulmonary;  Laterality: N/A;   VIDEO BRONCHOSCOPY Left 08/19/2022   Procedure: VIDEO BRONCHOSCOPY WITH FLUORO; WITH BAL;  Surgeon: Josephine Igo, DO;  Location: [facility];  Service: Cardiopulmonary;  Laterality: Left;     Social History:   reports that she has never smoked. She has never used smokeless tobacco. She reports that she does not currently use alcohol after a past usage of about 2.0 standard drinks of alcohol per week. She reports that she does not use drugs.   Family History:  Her family history includes Atrial fibrillation in her brother; Colon cancer in her father; Heart attack in her maternal grandfather; Other in her mother; Parkinson's disease in her paternal grandfather. There is no history of Esophageal cancer, Stomach cancer, or Rectal cancer.   Allergies Allergies  Allergen Reactions    Tramadol     hallucinations    Contrast Media [Iodinated Contrast Media] Rash   Iohexol Hives   Morphine And Codeine Other (See Comments)  Reaction:  Hallucinations    Methylprednisolone Other (See Comments)    Reaction:  Decreases pts heart rate    Hydrocodone Itching   Oxycodone-Acetaminophen Rash and Other (See Comments)    Reaction:  Hallucinations    Phenytoin Sodium Extended Rash   Zanaflex [Tizanidine Hydrochloride] Rash     Home Medications  Prior to Admission medications   Medication Sig Start Date End Date Taking? Authorizing Provider  albuterol (VENTOLIN HFA) 108 (90 Base) MCG/ACT inhaler INHALE 2 PUFFS INTO LUNGS EVERY 6 HOURS AS NEEDED. Patient taking differently: Inhale 1 puff into the lungs every 6 (six) hours as needed for shortness of breath. 08/29/22  Yes Kalman Shan, MD  calcium carbonate (OS-CAL - DOSED IN MG OF ELEMENTAL CALCIUM) 1250 (500 Ca) MG tablet Take 1 tablet (1,250 mg total) by mouth daily with breakfast. 06/22/22  Yes Arnetha Courser, MD  Coenzyme Q10 (VITALINE COQ10) 300 MG WAFR Take 300 mg by mouth in the morning.   Yes [provider]  dexlansoprazole (DEXILANT) 60 MG capsule TAKE 1 CAPSULE BY MOUTH  ONCE DAILY WITH BREAKFAST 05/12/22  Yes [name], MD  docusate sodium (COLACE) 100 MG capsule Take 1 capsule (100 mg total) by mouth daily as needed for mild constipation. Patient taking differently: Take 100 mg by mouth in the morning and at bedtime. 06/22/22  Yes Arnetha Courser, MD  dronabinol (MARINOL) 2.5 MG capsule Take 1 capsule (2.5 mg total) by mouth 2 (two) times daily before lunch and supper. 07/08/22  Yes [name], MD  famotidine (PEPCID) 20 MG tablet One after supper Patient taking differently: Take 20 mg by mouth at bedtime. One after supper 06/07/20  Yes [name], MD  Ferric Maltol (ACCRUFER) 30 MG CAPS Take 1 capsule (30 mg total) by mouth in the morning and at bedtime. 07/22/22  Yes [name], MD  FLUoxetine  (PROZAC) 40 MG capsule Take 1 capsule (40 mg total) by mouth daily. 04/09/22  Yes [name], MD  Fluticasone-Umeclidin-Vilant (TRELEGY ELLIPTA) 100-62.5-25 MCG/ACT AEPB Inhale 1 puff into the lungs daily. 05/26/22  Yes [name], MD  levothyroxine (SYNTHROID) 100 MCG tablet TAKE 1 TABLET BY MOUTH ONCE  DAILY BEFORE BREAKFAST Patient taking differently: Take 100 mcg by mouth daily before breakfast. 12/31/21  Yes [name], MD  methenamine (HIPREX) 1 g tablet Take 1 tablet (1 g total) by mouth 2 (two) times daily with a meal. 08/06/22  Yes [name], MD  mirtazapine (REMERON) 7.5 MG tablet Take 1 tablet (7.5 mg total) by mouth at bedtime. 08/06/22  Yes [name], MD  Multiple Vitamin (MULTIVITAMIN WITH MINERALS) TABS tablet Take 1 tablet by mouth in the morning.   Yes [provider]  Probiotic Product (PROBIOTIC PO) Take 1 capsule by mouth daily.   Yes [provider]  tamsulosin (FLOMAX) 0.4 MG CAPS capsule Take 0.4 mg by mouth daily.   Yes [provider]  temazepam (RESTORIL) 15 MG capsule Take 1 capsule (15 mg total) by mouth at bedtime. 07/11/22  Yes [name], MD  torsemide (DEMADEX) 10 MG tablet Take 1 tablet (10 mg total) by mouth daily. 07/08/22  Yes [name], MD  trimethoprim (TRIMPEX) 100 MG tablet Take 100 mg by mouth daily. 11/02/21  Yes [provider]  amphetamine-dextroamphetamine (ADDERALL) 20 MG tablet Take 1 tablet (20 mg total) by mouth daily. Patient not taking: Reported on 08/12/2022 07/02/22   [name], MD  capsaicin (ZOSTRIX) 0.025 % cream Apply topically every 6 (six) hours as needed (painful hip). Patient not taking: Reported on 08/26/2022 07/01/22   Valetta Fuller, Lynnell Jude, PA-C  mirabegron ER (MYRBETRIQ) 25 MG TB24 tablet Take 1 tablet (25 mg total) by mouth every other day. Patient taking differently: Take 25 mg by mouth daily. 07/02/22   Setzer, Lynnell Jude, PA-C  Respiratory Therapy Supplies (FLUTTER) DEVI  Use as directed 06/02/17   [name], MD  sodium chloride HYPERTONIC 3 % nebulizer solution Take by nebulization 2 (two) times daily as needed for cough. Dx J44.9 Patient not taking: Reported on 08/31/2022 08/29/22   Kalman Shan, MD     Signature:  Coralyn Helling, MD [facility] Pager - [phone] or [phone] 09/01/2022, 8:20 AM

## 2022-09-02 ENCOUNTER — Ambulatory Visit: Payer: Medicare Other | Admitting: Internal Medicine

## 2022-09-02 DIAGNOSIS — Z515 Encounter for palliative care: Secondary | ICD-10-CM | POA: Diagnosis not present

## 2022-09-02 DIAGNOSIS — B348 Other viral infections of unspecified site: Secondary | ICD-10-CM | POA: Diagnosis not present

## 2022-09-02 DIAGNOSIS — R531 Weakness: Secondary | ICD-10-CM

## 2022-09-02 DIAGNOSIS — Z7189 Other specified counseling: Secondary | ICD-10-CM

## 2022-09-02 DIAGNOSIS — R7881 Bacteremia: Secondary | ICD-10-CM | POA: Diagnosis not present

## 2022-09-02 DIAGNOSIS — J13 Pneumonia due to Streptococcus pneumoniae: Secondary | ICD-10-CM

## 2022-09-02 DIAGNOSIS — E43 Unspecified severe protein-calorie malnutrition: Secondary | ICD-10-CM | POA: Insufficient documentation

## 2022-09-02 DIAGNOSIS — B953 Streptococcus pneumoniae as the cause of diseases classified elsewhere: Secondary | ICD-10-CM | POA: Diagnosis not present

## 2022-09-02 DIAGNOSIS — J189 Pneumonia, unspecified organism: Secondary | ICD-10-CM | POA: Diagnosis not present

## 2022-09-02 LAB — LEGIONELLA PNEUMOPHILA SEROGP 1 UR AG: L. pneumophila Serogp 1 Ur Ag: NEGATIVE

## 2022-09-02 LAB — URINE CULTURE

## 2022-09-02 LAB — CULTURE, BLOOD (ROUTINE X 2)

## 2022-09-02 MED ORDER — BOOST / RESOURCE BREEZE PO LIQD CUSTOM
1.0000 | Freq: Three times a day (TID) | ORAL | Status: DC
  Administered 2022-09-02 – 2022-09-03 (×3): 1 via ORAL

## 2022-09-02 MED ORDER — DIPHENHYDRAMINE HCL 50 MG/ML IJ SOLN
25.0000 mg | Freq: Once | INTRAMUSCULAR | Status: AC
  Administered 2022-09-02: 25 mg via INTRAVENOUS
  Filled 2022-09-02: qty 1

## 2022-09-02 MED ORDER — ADULT MULTIVITAMIN W/MINERALS CH
1.0000 | ORAL_TABLET | Freq: Every day | ORAL | Status: DC
  Administered 2022-09-03 – 2022-09-04 (×2): 1 via ORAL
  Filled 2022-09-02 (×2): qty 1

## 2022-09-02 MED ORDER — SODIUM CHLORIDE 0.9 % IV SOLN
2.0000 g | INTRAVENOUS | Status: DC
  Administered 2022-09-02 – 2022-09-03 (×2): 2 g via INTRAVENOUS
  Filled 2022-09-02 (×2): qty 20

## 2022-09-02 NOTE — TOC Initial Note (Addendum)
Transition of Care [facility]) - Initial/Assessment Note    Patient Details  Name: Kayla [name] MRN: [id number] Date of Birth: January 20, 1951  Transition of Care [facility]) CM/SW Contact:    Howell Rucks, RN Phone Number: 09/02/2022, 5:19 PM  Clinical Narrative:  Call to pt in room to introduce role of TOC/NCM and review for dc needs. Pt requested I speak with her spouse Homero Fellers), introduced self to [place] and explained reason for call. TOC consult for Substance Abuse, Homero Fellers denies pt has ETOH concerns, declines resources. PT consult for Outpatient PT, Homero Fellers agreeable, educated Homero Fellers on obtaining a referral for Outpt PT from pt's PCP, Homero Fellers voiced understanding. PCP follow for outpatient PT added to AVS. Homero Fellers had no other questions/concerns. TOC will continue to follow.                  Patient Goals and CMS Choice            Expected Discharge Plan and Services                                              Prior Living Arrangements/Services                       Activities of Daily Living Home Assistive Devices/Equipment: Dan Humphreys (specify type) ADL Screening (condition at time of admission) Patient's cognitive ability adequate to safely complete daily activities?: Yes Is the patient deaf or have difficulty hearing?: No Does the patient have difficulty seeing, even when wearing glasses/contacts?: No Does the patient have difficulty concentrating, remembering, or making decisions?: No Patient able to express need for assistance with ADLs?: Yes Does the patient have difficulty dressing or bathing?: No Independently performs ADLs?: Yes (appropriate for developmental age) Does the patient have difficulty walking or climbing stairs?: Yes Weakness of Legs: Both Weakness of Arms/Hands: Both  Permission Sought/Granted                  Emotional Assessment              Admission diagnosis:  Aspiration pneumonia (HCC) [J69.0] Recurrent pneumonia  [J18.9] Patient Active Problem List   Diagnosis Date Noted   Aspiration pneumonia (HCC) 08/31/2022   Sepsis (HCC) 08/31/2022   Pulmonary infiltrates 08/19/2022   Bilateral leg edema 07/08/2022   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) 07/08/2022   Bilateral pubic rami fractures (HCC) 06/23/2022   GERD (gastroesophageal reflux disease) 06/20/2022   Acetabular fracture (HCC) 06/19/2022   Chronic obstructive pulmonary disease (HCC) 09/24/2021   Pulmonary mycobacterial infection (HCC) 09/24/2021   Bilateral vocal cord paralysis 09/24/2021   UTI (urinary tract infection) 05/24/2021   COPD (chronic obstructive pulmonary disease) (HCC)    Iron deficiency anemia secondary to inadequate dietary iron intake 12/20/2020   Purple toe syndrome of both feet (HCC) 07/26/2020   Chest pain 08/19/2019   Cough variant asthma vs vcd  related to MS  11/10/2016   Positional sleep apnea 09/10/2016   Periodic limb movements of sleep 09/10/2016   OSA on CPAP 02/08/2016   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/21/2013   Dislocation of hip prosthesis (HCC) 03/12/2013   Urinary retention with incomplete bladder emptying 03/09/2013   Dysphagia 03/09/2013   Constipation 02/26/2013   Hyponatremia 05/20/2012   Post-menopausal osteoporosis 09/17/2007   Hypothyroidism 04/01/2007   Multiple sclerosis (HCC) 04/01/2007  PCP:  [name], MD Pharmacy:   [facility] [place], [place] - [address] [facility] [address] [place] [address] Phone: [phone] Fax: [phone]  [facility] - [place], [place] - [address] [place] [address] Phone: [phone] Fax: [phone]  [facility] [place], [place] - [address] [place] [address] Phone: [phone] Fax: [phone]  [facility] [address] [place] [address] Phone: [phone] Fax: [phone]  [facility] [address] [place] [address] Phone: [phone] Fax: [phone]     Social Determinants of Health (SDOH) Social History: SDOH Screenings   Food Insecurity: No Food Insecurity (08/31/2022)  Housing: Low Risk  (08/31/2022)  Transportation Needs: No Transportation Needs (08/31/2022)  Utilities: Not At Risk (08/31/2022)  Alcohol Screen: Low Risk  (07/07/2022)  Depression (PHQ2-9): Medium Risk (07/14/2022)  Financial Resource Strain: Low Risk  (07/07/2022)  Physical Activity: Inactive (07/07/2022)  Social Connections: Socially Integrated (07/07/2022)  Stress: No Stress Concern Present (07/07/2022)  Tobacco Use: Low Risk  (09/01/2022)   SDOH Interventions:     Readmission Risk Interventions    09/02/2022    5:18 PM 06/20/2022    3:58 PM  Readmission Risk Prevention Plan  Post Dischage Appt  Complete  Medication Screening  Complete  Transportation Screening Complete Complete  PCP or Specialist Appt within 5-7 Days Complete   Home Care Screening Complete   Medication Review (RN CM) Complete

## 2022-09-02 NOTE — Progress Notes (Signed)
[facility]  PROGRESS NOTE  Kayla [name] AVW:[id number] DOB: September 06, 1950 DOA: 08/31/2022 PCP: [name], MD   Brief HPI:   female with medical history significant of multiple sclerosis and recurrent pneumonia, COPD/asthma, GERD, hypothyroidism history of urinary retention, presented with worsening shortness of breath.  Patient has history of multiple sclerosis and recurrent pneumonia, recently had bronchoscopy done by pulmonology at that time bronchial washing grew pneumococcus and she was treated with Augmentin.  She has been off IVIG for 3 months after she was admitted for recruitments fracture. CT chest on May 20 showed mucoid impaction and changes suggestive of episode of aspiration pneumonia as well as cylindrical bronchiectasis. CT chest obtained yesterday was negative for PE, showed scattered groundglass opacities in the lungs bilaterally.  Bronchial wall thickening in the lower lobes bilaterally with debris in the right lower lobe bronchus and multifocal mucous plugging and consolidation of the right lung base concerning for pneumonia.    Assessment/Plan:   Bilateral pneumonia -Seen on CT chest; procalcitonin 1.62 -Respiratory panel positive for rhinovirus -Blood cultures positive for strep pneumoniae -Antibiotics changed to IV Zosyn -WBC down to 24,000  ?  Pseudomonas UTI -Urine culture grew 20,000 colonies of Pseudomonas -She is already on Zosyn and improving -Follow culture and sensitivity results   Bronchiectasis/asthma -Continue Roxy Manns, Pulmicort -PCCM following  Hypothyroidism -Continue Synthroid  Immunodeficiency/history of multiple sclerosis -Patient was on 1 L for multiple sclerosis, which caused her immunodeficiency -She was started on IVIG for that -IVIG has been on hold for past 3 months since she was admitted for pelvic fracture -Follow-up allergy immunology/neurology as outpatient  Urinary retention -Continue as needed In-N-Out  cath  Bilateral vocal cord paralysis -Contributing to patient's aspiration and coughing  Hyponatremia -Mild; follow serum sodium in a.m.  Constipation -KUB showed moderate stool burden -Continue bowel regimen  Goals of care discussion -Palliative care has been consulted   Medications     arformoterol  15 mcg Nebulization BID   budesonide (PULMICORT) nebulizer solution  0.25 mg Nebulization BID   docusate sodium  100 mg Oral BID   enoxaparin (LOVENOX) injection  30 mg Subcutaneous Q24H   feeding supplement  237 mL Oral BID BM   FLUoxetine  40 mg Oral Daily   guaiFENesin  600 mg Oral BID   levothyroxine  100 mcg Oral Q0600   mirabegron ER  25 mg Oral Daily   mouth rinse  15 mL Mouth Rinse 4 times per day   pantoprazole  40 mg Oral Daily   revefenacin  175 mcg Nebulization Daily   senna  1 tablet Oral BID   sodium chloride HYPERTONIC  4 mL Nebulization BID   tamsulosin  0.4 mg Oral Daily   temazepam  15 mg Oral QHS     Data Reviewed:   CBG:  No results for input(s): "GLUCAP" in the last 168 hours.  SpO2: 100 % O2 Flow Rate (L/min): 2 L/min    Vitals:   09/02/22 0528 09/02/22 0718 09/02/22 0823 09/02/22 0827  BP: 106/60     Pulse: 85     Resp: 20     Temp: 98.2 F (36.8 C)     TempSrc: Oral     SpO2: 98%  96% 100%  Weight:  54.2 kg    Height:          Data Reviewed:  Basic Metabolic Panel: Recent Labs  Lab 08/31/22 1614 08/31/22 2131 09/01/22 0406  NA 132*  --  133*  K 4.3  --  4.7  CL 98  --  98  CO2 22  --  24  GLUCOSE 92  --  121*  BUN 14  --  16  CREATININE 0.70  --  0.66  CALCIUM 9.2  --  9.1  MG  --  2.3 2.2  PHOS  --  3.7 4.1    CBC: Recent Labs  Lab 08/31/22 1614 09/01/22 0406  WBC 34.2* 24.7*  NEUTROABS 30.9*  --   HGB 11.7* 10.4*  HCT 36.3 32.7*  MCV 90.3 90.8  PLT 623* 657*    LFT Recent Labs  Lab 08/31/22 1614 09/01/22 0406  AST 16 17  ALT 13 13  ALKPHOS 87 90  BILITOT 0.5 0.5  PROT 6.5 6.2*  ALBUMIN 2.9*  2.6*     Antibiotics: Anti-infectives (From admission, onward)    Start     Dose/Rate Route Frequency Ordered Stop   09/01/22 1800  azithromycin (ZITHROMAX) tablet 500 mg  Status:  Discontinued        500 mg Oral Daily-1800 09/01/22 0903 09/01/22 1309   09/01/22 1000  azithromycin (ZITHROMAX) 500 mg in sodium chloride 0.9 % 250 mL IVPB  Status:  Discontinued        500 mg 250 mL/hr over 60 Minutes Intravenous Every 24 hours 08/31/22 1851 09/01/22 0903   09/01/22 0200  piperacillin-tazobactam (ZOSYN) IVPB 3.375 g        3.375 g 12.5 mL/hr over 240 Minutes Intravenous Every 8 hours 08/31/22 1901     08/31/22 1900  piperacillin-tazobactam (ZOSYN) IVPB 3.375 g  Status:  Discontinued        3.375 g 100 mL/hr over 30 Minutes Intravenous  Once 08/31/22 1859 09/02/22 0728   08/31/22 1845  azithromycin (ZITHROMAX) 500 mg in sodium chloride 0.9 % 250 mL IVPB        500 mg 250 mL/hr over 60 Minutes Intravenous  Once 08/31/22 1833 09/01/22 0743   08/31/22 1645  cefTRIAXone (ROCEPHIN) 1 g in sodium chloride 0.9 % 100 mL IVPB        1 g 200 mL/hr over 30 Minutes Intravenous  Once 08/31/22 1634 08/31/22 1818        DVT prophylaxis: Lovenox  Code Status: Full code  Family Communication: Discussed with patient's husband at bedside   CONSULTS    Subjective   Patient seen and examined, feels better today.  Palliative care discussion today.   Objective    Physical Examination:  General-appears in no acute distress Heart-S1-S2, regular, no murmur auscultated Lungs-clear to auscultation bilaterally, no wheezing or crackles auscultated Abdomen-soft, nontender, no organomegaly Extremities-no edema in the lower extremities Neuro-alert, oriented x3, no focal deficit noted  Status is: Inpatient:             Meredeth Ide   [facility] If 7PM-7AM, please contact night-coverage at [email or web address], Office  [phone]   09/02/2022, 9:41 AM  LOS: 2 days

## 2022-09-02 NOTE — Consult Note (Signed)
NAME:  Kayla [name], MRN:  [id number], DOB:  11/29/1950, LOS: 2 ADMISSION DATE:  08/31/2022, CONSULTATION DATE:  09/01/2022 REFERRING MD:  Dr. Adela Glimpse, [facility], CHIEF COMPLAINT:  short of breathing   History of Present Illness:  72 yo female followed by Dr. Marchelle Gearing in the pulmonary clinic for bronchiectasis and asthma with chronic cough was found to have worsening Lt lower lobe infiltrate at visit on 08/12/22.  She had bronchoscopy with bilateral washings of the mainstem bronchi, BAL of Lt and Rt lower lobes.  BAL was positive for pneumococcus and treated with augmentin.  She has a history of immunodeficiency on IVIG, but this was stopped early in 2024.  She presented to [facility] ER on 6/09 with progressive dyspnea and wheezing.  She was admitted for aspiration pneumonia.  Respiratory viral panel positive for rhinovirus.  PCCM consulted to assist with respiratory management.  Pertinent  Medical History  Multiple sclerosis, Hiatal hernia, Bilateral vocal cord immobility, Pneumonia, Immunodeficiency on IVIG, Hypothyroidism, Urine retention, GERD, Cervical cancer, Depression, Insomnia  Significant Hospital Events: Including procedures, antibiotic start and stop dates in addition to other pertinent events   6/09 Admit 6/10 speech therapy >> normal swallow assessment  Studies:  Ig levels 12/22/19 >> IgA 51, IgG 289, IgM 27 (all low) PFT 10/24/21 >> FEV1 1.59 (72%), FEV1% 71, TLC 4.92 (100%), DLCO 60%, +BD CT chest 08/11/22 >> apical scarring, cylindrical BTX more in lower lobes, mucoid impaction and peribronchovascular nodularity in lower lobes Lt > Rt CT angio chest 08/31/22 >> debris in RLL bronchus, b/l lower lobe bronchial wall thickening with multifocal mucus plugging, scattered areas of GGO b/l, RLL area of consolidation  Interim History / Subjective:  Feels better.  Still has cough.  Still feels fatigued.    Objective   Blood pressure 106/60, pulse 85, temperature 98.2 F (36.8 C), temperature  source Oral, resp. rate 20, height 5\' 3"  (1.6 m), weight 45.4 kg, SpO2 100 %.        Intake/Output Summary (Last 24 hours) at 09/02/2022 0915 Last data filed at 09/02/2022 0645 Gross per 24 hour  Intake 2114.82 ml  Output 500 ml  Net 1614.82 ml   Filed Weights   08/31/22 1443  Weight: 45.4 kg    Examination:  General - alert, thin Eyes - pupils reactive ENT - no sinus tenderness, no stridor, decreased voice quality Cardiac - regular rate/rhythm, no murmur Chest - bilateral inspiratory squeaks and crackles, more at bases Abdomen - soft, non tender, + bowel sounds Extremities - no cyanosis, clubbing, or edema Skin - no rashes Neuro - normal strength, moves extremities, follows commands Psych - normal mood and behavior  Resolved Hospital Problem list     Assessment & Plan:   Bilateral lower lobe community acquired pneumonia with Pneumococcal bacteremia and Rhinovirus. - day 3 of Abx - f/u CXR intermittently - will need at least 2 weeks total of ABx - might be able to transition to oral ABx soon if she continues to improve  Acute exacerbation of bronchiectasis and asthma. - continue yupelri, brovana, pulmicort - continue hypertonic saline nebulizer through 6/12 - prn albuterol - flutter valve bid, chest percussion - mucinex bid - will need to arrange for chest percussion vest as an outpt  Acute hypoxic respiratory failure. - goal SpO2 > 92%  Immunodeficiency. - secondary to MS immunomodulatory therapy - had to stop IVIG therapy in March 2024 after she was hospitalized for pubic ramus fracture - followed by Dr. Loralyn Freshwater with  [facility] in [place], [place] - will need follow up as outpt to assess for resumption of IVIG therapy; family reports she has appointment with immunology during week of 09/08/22  Pseudomonas in urine culture. - unclear significance  Bilateral vocal cord cord immobility with small trans-glottal airway. - followed by Dr.  Harriette Ohara with ENT at [facility]  Multiple sclerosis. - followed by Dr. Tyron Russell with neurology in [facility] (right click and "Reselect all SmartList Selections" daily)   Diet/type: Regular consistency (see orders) DVT prophylaxis: LMWH GI prophylaxis: PPI Lines: N/A Foley:  N/A Code Status:  full code Last date of multidisciplinary goals of care discussion [updated her husband at bedside]  Labs       Latest Ref Rng & Units 09/01/2022    4:06 AM 08/31/2022    4:14 PM 08/19/2022    9:39 AM  CMP  Glucose 70 - 99 mg/dL 811  92  97   BUN 8 - 23 mg/dL 16  14  17    Creatinine 0.44 - 1.00 mg/dL 9.14  7.82  9.56   Sodium 135 - 145 mmol/L 133  132  137   Potassium 3.5 - 5.1 mmol/L 4.7  4.3  3.7   Chloride 98 - 111 mmol/L 98  98  101   CO2 22 - 32 mmol/L 24  22  24    Calcium 8.9 - 10.3 mg/dL 9.1  9.2  9.1   Total Protein 6.5 - 8.1 g/dL 6.2  6.5    Total Bilirubin 0.3 - 1.2 mg/dL 0.5  0.5    Alkaline Phos 38 - 126 U/L 90  87    AST 15 - 41 U/L 17  16    ALT 0 - 44 U/L 13  13         Latest Ref Rng & Units 09/01/2022    4:06 AM 08/31/2022    4:14 PM 08/19/2022    9:38 AM  CBC  WBC 4.0 - 10.5 K/uL 24.7  34.2  29.2   Hemoglobin 12.0 - 15.0 g/dL 21.3  08.6  57.8   Hematocrit 36.0 - 46.0 % 32.7  36.3  37.4   Platelets 150 - 400 K/uL 657  623  401    Signature:  Coralyn Helling, MD [facility] Pager - [phone] or [phone] 09/02/2022, 9:15 AM

## 2022-09-02 NOTE — Telephone Encounter (Signed)
Pt had a OV today with Dr.Rama. Nothing further needed at this time

## 2022-09-02 NOTE — Progress Notes (Signed)
Called by RN that patient was having itching and rash at her chest, abdomen and back.  She did receive Zosyn this morning.  No other new medication given.  Will discontinue Zosyn for now, start ceftriaxone for strep pneumonia bacteremia.  Will give 1 dose of Benadryl 25 mg IV x 1.  Vital signs are stable.  Denies shortness of breath.  Currently on 1 L/min of oxygen

## 2022-09-02 NOTE — Progress Notes (Signed)
Initial Nutrition Assessment  DOCUMENTATION CODES:   Severe malnutrition in context of chronic illness  INTERVENTION:  - DYS 3 diet recommended by SLP. - Boost Breeze po TID, each supplement provides 250 kcal and 9 grams of protein - Encourage intake at all meals and of supplements.  - Multivitamin with minerals daily - Monitor weight trends.   NUTRITION DIAGNOSIS:   Severe Malnutrition related to chronic illness as evidenced by moderate fat depletion, moderate muscle depletion, energy intake < or equal to 75% for > or equal to 1 month, percent weight loss (10% in 2.5 months).  GOAL:   Patient will meet greater than or equal to 90% of their needs  MONITOR:   PO intake, Supplement acceptance, Weight trends  REASON FOR ASSESSMENT:   Consult Assessment of nutrition requirement/status  ASSESSMENT:   72 y.o. female with PMH of multiple sclerosis and recurrent pneumonia, COPD/asthma, GERD, hypothyroidism who presented with worsening shortness of breath. Admitted for pneumonia.   Met with patient and husband at bedside this afternoon. Patient eating lunch of tomato soup and egg salad sandwich.   Husband provided much of patient's nutrition related history. UBW reported to be 115# and he endorses patient has been losing weight since breaking her pelvis at the end of March.  Per EMR, patient weighed at 111# 3/31 and now weighed at 100#. This is an 11# or 10% weight loss in 2.5 months, which is significant for the time frame.   Patient endorses eating 3 small meals a day at home. Has trouble chewing some foods as well as notes that it is sometimes "hard to swallow" foods. Notes these cause her to eat a lot slower which is a hinderance to eating well.  Husband brought patient Premier Protein to have at home which patient was occasionally drinking but not daily.  Appetite has been very poor over the past few weeks. Current appetite remains about the same. Patient has been trying to eat  something at all three meals. Patient had 50% of lunch and dinner yesterday and 35% of breakfast today. Had an Ensure yesterday but admits she doesn't really like them, just tolerates.   Discussed increased kcal and protein needs. Encouraged patient to order and try and consume something at all three meals. She is agreeable to try Boost Breeze instead of Ensure.  Patient being followed by SLP this admission. Recommend regular diet yesterday but now recommended a DYS 3 diet today. Planning for MBS on 6/13.    Palliative care is now consulted.   Medications reviewed and include: Colace, Senokot  Labs reviewed:  Na 133   NUTRITION - FOCUSED PHYSICAL EXAM:  Flowsheet Row Most Recent Value  Orbital Region Moderate depletion  Upper Arm Region Moderate depletion  Thoracic and Lumbar Region Mild depletion  Buccal Region Mild depletion  Temple Region Mild depletion  Clavicle Bone Region Mild depletion  Clavicle and Acromion Bone Region Moderate depletion  Scapular Bone Region Unable to assess  Dorsal Hand Mild depletion  Patellar Region Moderate depletion  Anterior Thigh Region Moderate depletion  Posterior Calf Region Mild depletion  Edema (RD Assessment) None  Hair Reviewed  Eyes Reviewed  Mouth Reviewed  Skin Reviewed  Nails Reviewed       Diet Order:   Diet Order             Diet regular Room service appropriate? Yes; Fluid consistency: Thin  Diet effective now  EDUCATION NEEDS:  Education needs have been addressed  Skin:  Skin Assessment: Reviewed RN Assessment  Last BM:  6/10  Height:  Ht Readings from Last 1 Encounters:  08/31/22 5\' 3"  (1.6 m)   Weight:  Wt Readings from Last 1 Encounters:  08/31/22 45.4 kg    BMI:  Body mass index is 17.71 kg/m.  Estimated Nutritional Needs:  Kcal:  1600-1800 kcals Protein:  65-80 grams Fluid:  >/= 1.6L    Shelle Iron RD, LDN For contact information, refer to [facility].

## 2022-09-02 NOTE — Progress Notes (Signed)
Pt ambulated to bathroom without O2.  Saturation dropped to 84%.  Pt placed back on 2L O2 and sat increased to 97%.

## 2022-09-02 NOTE — Consult Note (Signed)
Consultation Note Date: 09/02/2022   Patient Name: Kayla [name]  DOB: 1950-11-19  MRN: [id number]  Age / Sex: 72 y.o., female  PCP: [name], MD Referring Physician: Meredeth Ide, MD  Reason for Consultation: Establishing goals of care  HPI/Patient Profile: 72 y.o. female    admitted on 08/31/2022   Clinical Assessment and Goals of Care: 72 year old lady who lives at home with her husband.  She sees Dr. Marchelle Gearing in the pulmonary clinic for bronchiectasis and asthma with chronic cough, she has history of multiple sclerosis hiatal hernia bilateral vocal cord immobility immunodeficiency on IVIG, history of cervical cancer, recent pneumonia.  Patient remains admitted to hospital medicine service for bilateral lower lobe community-acquired pneumonia with pneumococcal bacteremia and also rhinovirus.  She has acute exacerbation of bronchiectasis and asthma hence she is with acute hypoxic respiratory failure.  She has underlying multiple sclerosis, history of immune deficiency, history of bilateral vocal cord immobility and small trans glottal airway. Patient continues on antibiotics and current hospitalization.  Seen by pulmonary specialist and palliative care consulted for ongoing goals of care discussions. Patient is sitting up in a chair.  She has chronic cough.  Her husband is present at bedside.  Chart reviewed. Palliative medicine is specialized medical care for people living with serious illness. It focuses on providing relief from the symptoms and stress of a serious illness. The goal is to improve quality of life for both the patient and the family. Goals of care: Broad aims of medical therapy in relation to the patient's values and preferences. Our aim is to provide medical care aimed at enabling patients to achieve the goals that matter most to them, given the circumstances of their particular medical  situation and their constraints.  Today, at the time of initial consultation, we compared and contrasted hospice versus palliative services.  We discussed about home-based palliative support and what that looks like.  Alternatively, we discussed about hospice philosophy of care.  Answered all of their questions to the best of my ability.  Also corresponded with [facility] liaison guarding patient and family is interested in home-based palliative services..  See below.  NEXT OF KIN Husband and daughter  SUMMARY OF RECOMMENDATIONS   Full code-full scope care for now Patient to explore home-based palliative services in the outpatient setting. Continue current mode of care  Code Status/Advance Care Planning: Full code   Symptom Management:     Palliative Prophylaxis:  Frequent Pain Assessment  Additional Recommendations (Limitations, Scope, Preferences): Full Scope Treatment  Psycho-social/Spiritual:  Desire for further Chaplaincy support:yes Additional Recommendations: Caregiving  Support/Resources  Prognosis:  Unable to determine  Discharge Planning: Home with Palliative Services      Primary Diagnoses: Present on Admission:  Aspiration pneumonia (HCC)  Hypothyroidism  Multiple sclerosis (HCC)  Iron deficiency anemia secondary to inadequate dietary iron intake  GERD (gastroesophageal reflux disease)  Urinary retention with incomplete bladder emptying  Bilateral vocal cord paralysis  COPD (chronic obstructive pulmonary disease) (HCC)  Chest pain  Hyponatremia  Constipation  Sepsis (HCC)  UTI (urinary tract infection)   I have reviewed the medical record, interviewed the patient and family, and examined the patient. The following aspects are pertinent.  Past Medical History:  Diagnosis Date   Arthritis    oa   Asthma    Bilateral vocal cord paralysis    Bronchiectasis (HCC)    Cervical cancer (HCC) 2005   Depression    GERD (gastroesophageal  reflux disease)    Hiatal hernia    Hypothyroidism    Immunodeficiency (HCC)    Gets IVIG infusions   Insomnia    Multiple sclerosis (HCC) 1996   Osteoporosis    Pneumonia 2024, 2014, 1992   Urine retention    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  Tobacco Use   Smoking status: Never   Smokeless tobacco: Never  Vaping Use   Vaping Use: Never used  Substance and Sexual Activity   Alcohol use: Not Currently    Alcohol/week: 2.0 standard drinks of alcohol    Types: 1 Glasses of wine, 1 Cans of beer per week    Comment: 2x/week    Drug use: No   Sexual activity: Not on file  Other Topics Concern   Not on file  Social History Narrative   Not on file   Social Determinants of Health   Financial Resource Strain: Low Risk  (07/07/2022)   Overall Financial Resource Strain (CARDIA)    Difficulty of Paying Living Expenses: Not very hard  Food Insecurity: No Food Insecurity (08/31/2022)   Hunger Vital Sign    Worried About Running Out of Food in the Last Year: Never true    Ran Out of Food in the Last Year: Never true  Transportation Needs: No Transportation Needs (08/31/2022)   PRAPARE - Administrator, Civil Service (Medical): No    Lack of Transportation (Non-Medical): No  Physical Activity: Inactive (07/07/2022)   Exercise Vital Sign    Days of Exercise per Week: 0 days    Minutes of Exercise per Session: 60 min  Stress: No Stress Concern Present (07/07/2022)   [employer] of Occupational Health - Occupational Stress Questionnaire    Feeling of Stress : Only a little  Social Connections: Socially Integrated (07/07/2022)   Social Connection and Isolation Panel [NHANES]    Frequency of Communication with Friends and Family: Three times a week     Frequency of Social Gatherings with Friends and Family: Once a week    Attends Religious Services: 1 to 4 times per year    Active Member of [employer] or Organizations: Yes    Attends Banker Meetings: 1 to 4 times per year    Marital Status: Married   Family History  Problem Relation Age of Onset   Other Mother        COVID   Colon cancer Father    Atrial fibrillation Brother    Heart attack Maternal Grandfather    Parkinson's disease Paternal Grandfather    Esophageal cancer Neg Hx    Stomach cancer Neg Hx    Rectal cancer Neg Hx    Scheduled Meds:  arformoterol  15 mcg Nebulization BID   budesonide (PULMICORT) nebulizer solution  0.25  mg Nebulization BID   docusate sodium  100 mg Oral BID   enoxaparin (LOVENOX) injection  30 mg Subcutaneous Q24H   feeding supplement  237 mL Oral BID BM   FLUoxetine  40 mg Oral Daily   guaiFENesin  600 mg Oral BID   levothyroxine  100 mcg Oral Q0600   mirabegron ER  25 mg Oral Daily   mouth rinse  15 mL Mouth Rinse 4 times per day   pantoprazole  40 mg Oral Daily   revefenacin  175 mcg Nebulization Daily   senna  1 tablet Oral BID   sodium chloride HYPERTONIC  4 mL Nebulization BID   tamsulosin  0.4 mg Oral Daily   temazepam  15 mg Oral QHS   Continuous Infusions:  lactated ringers 50 mL/hr at 09/02/22 0645   piperacillin-tazobactam (ZOSYN)  IV 3.375 g (09/02/22 0821)   PRN Meds:.albuterol, bisacodyl, metoCLOPramide (REGLAN) injection, ondansetron **OR** ondansetron (ZOFRAN) IV, phenol Medications Prior to Admission:  Prior to Admission medications   Medication Sig Start Date End Date Taking? Authorizing Provider  albuterol (VENTOLIN HFA) 108 (90 Base) MCG/ACT inhaler INHALE 2 PUFFS INTO LUNGS EVERY 6 HOURS AS NEEDED. Patient taking differently: Inhale 1 puff into the lungs every 6 (six) hours as needed for shortness of breath. 08/29/22  Yes Kalman Shan, MD  calcium carbonate (OS-CAL - DOSED IN MG OF ELEMENTAL CALCIUM)  1250 (500 Ca) MG tablet Take 1 tablet (1,250 mg total) by mouth daily with breakfast. 06/22/22  Yes Arnetha Courser, MD  Coenzyme Q10 (VITALINE COQ10) 300 MG WAFR Take 300 mg by mouth in the morning.   Yes [provider]  dexlansoprazole (DEXILANT) 60 MG capsule TAKE 1 CAPSULE BY MOUTH  ONCE DAILY WITH BREAKFAST 05/12/22  Yes [name], MD  docusate sodium (COLACE) 100 MG capsule Take 1 capsule (100 mg total) by mouth daily as needed for mild constipation. Patient taking differently: Take 100 mg by mouth in the morning and at bedtime. 06/22/22  Yes Arnetha Courser, MD  dronabinol (MARINOL) 2.5 MG capsule Take 1 capsule (2.5 mg total) by mouth 2 (two) times daily before lunch and supper. 07/08/22  Yes [name], MD  famotidine (PEPCID) 20 MG tablet One after supper Patient taking differently: Take 20 mg by mouth at bedtime. One after supper 06/07/20  Yes [name], MD  Ferric Maltol (ACCRUFER) 30 MG CAPS Take 1 capsule (30 mg total) by mouth in the morning and at bedtime. 07/22/22  Yes [name], MD  FLUoxetine (PROZAC) 40 MG capsule Take 1 capsule (40 mg total) by mouth daily. 04/09/22  Yes [name], MD  Fluticasone-Umeclidin-Vilant (TRELEGY ELLIPTA) 100-62.5-25 MCG/ACT AEPB Inhale 1 puff into the lungs daily. 05/26/22  Yes [name], MD  levothyroxine (SYNTHROID) 100 MCG tablet TAKE 1 TABLET BY MOUTH ONCE  DAILY BEFORE BREAKFAST Patient taking differently: Take 100 mcg by mouth daily before breakfast. 12/31/21  Yes [name], MD  methenamine (HIPREX) 1 g tablet Take 1 tablet (1 g total) by mouth 2 (two) times daily with a meal. 08/06/22  Yes [name], MD  mirtazapine (REMERON) 7.5 MG tablet Take 1 tablet (7.5 mg total) by mouth at bedtime. 08/06/22  Yes [name], MD  Multiple Vitamin (MULTIVITAMIN WITH MINERALS) TABS tablet Take 1 tablet by mouth in the morning.   Yes [provider]  Probiotic Product (PROBIOTIC PO) Take 1 capsule by  mouth daily.   Yes [provider]  tamsulosin (FLOMAX) 0.4 MG CAPS capsule Take 0.4 mg by mouth daily.   Yes [provider]  temazepam (RESTORIL) 15 MG capsule Take 1 capsule (15 mg total) by mouth at bedtime. 07/11/22  Yes [name], MD  torsemide (DEMADEX) 10 MG tablet Take 1 tablet (10 mg total) by mouth daily. 07/08/22  Yes [name], MD  trimethoprim (TRIMPEX) 100 MG tablet Take 100 mg by mouth daily. 11/02/21  Yes [provider]  amphetamine-dextroamphetamine (ADDERALL) 20 MG tablet Take 1 tablet (20 mg total) by mouth daily. Patient not taking: Reported on 08/12/2022 07/02/22   [name], MD  capsaicin (ZOSTRIX) 0.025 % cream Apply topically every 6 (six) hours as needed (painful hip). Patient not taking: Reported on 08/26/2022 07/01/22   Valetta Fuller, Lynnell Jude, PA-C  mirabegron ER (MYRBETRIQ) 25 MG TB24 tablet Take 1 tablet (25 mg total) by mouth every other day. Patient taking differently: Take 25 mg by mouth daily. 07/02/22   Setzer, Lynnell Jude, PA-C  Respiratory Therapy Supplies (FLUTTER) DEVI Use as directed 06/02/17   [name], MD  sodium chloride HYPERTONIC 3 % nebulizer solution Take by nebulization 2 (two) times daily as needed for cough. Dx J44.9 Patient not taking: Reported on 08/31/2022 08/29/22   Kalman Shan, MD   Allergies  Allergen Reactions   Tramadol     hallucinations    Contrast Media [Iodinated Contrast Media] Rash   Iohexol Hives   Morphine And Codeine Other (See Comments)    Reaction:  Hallucinations    Methylprednisolone Other (See Comments)    Reaction:  Decreases pts heart rate    Hydrocodone Itching   Oxycodone-Acetaminophen Rash and Other (See Comments)    Reaction:  Hallucinations    Phenytoin Sodium Extended Rash   Zanaflex [Tizanidine Hydrochloride] Rash   Review of Systems + chronic cough Physical Exam Sitting up in a chair awake alert Appears with generalized weakness Has chronic cough and Has muscle  wasting Appears with dysthymia No peripheral edema  Vital Signs: BP 106/60 (BP Location: Left Arm)   Pulse 85   Temp 98.2 F (36.8 C) (Oral)   Resp 20   Ht 5\' 3"  (1.6 m)   Wt 54.2 kg   SpO2 98%   BMI 21.17 kg/m  Pain Scale: 0-10   Pain Score: 0-No pain   SpO2: SpO2: 98 % O2 Device:SpO2: 98 % O2 Flow Rate: .O2 Flow Rate (L/min): 2 L/min  IO: Intake/output summary:  Intake/Output Summary (Last 24 hours) at 09/02/2022 1228 Last data filed at 09/02/2022 1029 Gross per 24 hour  Intake 2144.49 ml  Output 700 ml  Net 1444.49 ml    LBM: Last BM Date : 09/01/22 Baseline Weight: Weight: 45.4 kg Most recent weight: Weight: 54.2 kg     Palliative Assessment/Data:   Palliative performance scale 60%  Time In: 11.30 Time Out: 12.30 Time Total: 60 Greater than 50%  of this time was spent counseling and coordinating care related to the above assessment and plan.  Signed by: Rosalin Hawking, MD   Please contact Palliative Medicine Team phone at [phone] for questions and concerns.  For individual provider: See Loretha Stapler

## 2022-09-02 NOTE — Progress Notes (Addendum)
Speech Language Pathology Treatment: Dysphagia  Patient Details Name: Kayla [name] MRN: [id number] DOB: 05/16/1950 Today's Date: 09/02/2022 Time: 1040-1100 SLP Time Calculation (min) (ACUTE ONLY): 20 min  Assessment / Plan / Recommendation Clinical Impression  Patient denies dysphagia with spouse present.  Kayla [name] admits to decline in swallow function with overall deconditioning since March 2024.  Her problems swallowing require extra time to eat and contribute to weight loss. She states it is hard to swallow but did not expand on information.   SLP prior MBS conducted July 2022 as an outpatient with patient and spouse.  Reviewed fluoroscopy loops with spouse explaining source of her dysphagia.  Spouse reports they have been dealing with her chronic cough for 2 years and had come to accepted.  SLP advised that swallowing test and x-ray is to evaluate her swallow function, determine effective compensation strategies and established exercise program to improve musculature.    Discussed risks (constipation, discovering severe dysphagia without helpful strategies determined) and benefits (discovering other effective compensation strategies and determining effective exercise program to mitigate dysphagia) and patient/spouse willing to proceed.    Patient is managing her secretions as best able by using saline spray and swallowing mucus rather than expectorating it per her ENT recommendation.  She reports instructions to relax and swallow was most helpful to decrease cough - reports secretions present on larynx due to bilateral vocal cord paresis.   She had a bowel movement today and patient and spouse endorse chronic constipation prior to admission.  SLP advised to hold on repeat swallowing test to assure patient's bowels are functioning and consider test Thursday, 09/04/2022 with minimal barium provided.  Patient and spouse agreeable to plan.  Observed patient consuming water with no s/s of  aspiration.  SLP advised she continue to drink plenty of water to help decrease viscosity of secretions and for pH neutral status making it safest to aspirate.  Spouse reports pt coughs with medications, provided options of trying with small amount of applesauce, but she declined.   Spouse reports palliative meeting took place this morning and options were reviewed but he did not provide further detailed information.    Of note, pt also with prior MBS study in 2014 that showed Moderate pharyngeal phase dysphagia;Moderate cervical esophageal phase dysphagia Pt. exhibits a moderate pharyngeal and cervical esophageal dysphagia, characterized by reduced base of tongue contraction to the pharyngeal wall, reduced pharyngeal peristalsis, and reduced crichopharyngeal relaxation. These deficits resulted in moderate pharyngeal residue with solids in the vallculae and pyriform sinuses.     HPI HPI: Patient is a 72 y.o. female with PMH: Multiple Sclerosis, recurrent PNA, COPD/asthma, GERD, hiatal hernia, hypothyroidism, bilateral vocal cord paralysis, depression, cervical cancer and h/o UTI. MBS completed in July of 2022 did not show any aspiration of liquids or solids but did show pharyngeal retention of solids which decreased with subsequent swallows. She presented to the hospital on 08/31/22 with worsening SOB. She had brochoscopy done by pulmonology recently and was started on Augmentin due to cultures growing strep PNA. She has been off of her IVIG for approximately 3 months. CT chest from 08/31/22 showed debris in RLL bronchus, b/l lower lobe bronchial wall thickening with multifocal mucus plugging, scattered areas of GGO b/l, RLL area of consolidation. MD started h her on PO diet of Dys 3 (mechanical soft) solids and nectar thick liquids.  Pt reports being very fatigued.      SLP Plan  MBS (will tentatively plan MBS 09/04/2022 am)  Recommendations for follow up therapy are one component of a  multi-disciplinary discharge planning process, led by the attending physician.  Recommendations may be updated based on patient status, additional functional criteria and insurance authorization.    Recommendations  Diet recommendations: Thin liquid;Dysphagia 3 (mechanical soft) Liquids provided via: Straw;Cup Medication Administration: Other (Comment) (as tolerated, advised she try meds with applesauce *small amount* if difficult to swallow) Supervision: Patient able to self feed Compensations: Slow rate;Small sips/bites Postural Changes and/or Swallow Maneuvers: Seated upright 90 degrees;Upright 30-60 min after meal                  Oral care BID;Patient independent with oral care     Dysphagia, unspecified (R13.10)     MBS (will tentatively plan MBS 09/04/2022 am)    Kayla Infante, MS [facility] SLP Acute Rehab Services Office [phone]  Kayla [name]  09/02/2022, 11:16 AM

## 2022-09-02 NOTE — Telephone Encounter (Signed)
Started to hear she is in the hospital with pneumonia.  She is at [facility].  I will inform the hospitalist to call pulmonary if desired.  Please let her know that.  Please also give Hospital follow-up say in 2 weeks from now to see nurse practitioner

## 2022-09-03 DIAGNOSIS — J470 Bronchiectasis with acute lower respiratory infection: Secondary | ICD-10-CM | POA: Diagnosis not present

## 2022-09-03 DIAGNOSIS — Z7189 Other specified counseling: Secondary | ICD-10-CM

## 2022-09-03 DIAGNOSIS — J13 Pneumonia due to Streptococcus pneumoniae: Secondary | ICD-10-CM | POA: Diagnosis not present

## 2022-09-03 DIAGNOSIS — Z515 Encounter for palliative care: Secondary | ICD-10-CM

## 2022-09-03 DIAGNOSIS — B953 Streptococcus pneumoniae as the cause of diseases classified elsewhere: Secondary | ICD-10-CM | POA: Diagnosis not present

## 2022-09-03 DIAGNOSIS — R7881 Bacteremia: Secondary | ICD-10-CM | POA: Diagnosis not present

## 2022-09-03 DIAGNOSIS — E871 Hypo-osmolality and hyponatremia: Secondary | ICD-10-CM | POA: Diagnosis not present

## 2022-09-03 LAB — CBC
HCT: 30.7 % — ABNORMAL LOW (ref 36.0–46.0)
Hemoglobin: 9.5 g/dL — ABNORMAL LOW (ref 12.0–15.0)
MCH: 28.5 pg (ref 26.0–34.0)
MCHC: 30.9 g/dL (ref 30.0–36.0)
MCV: 92.2 fL (ref 80.0–100.0)
Platelets: 615 10*3/uL — ABNORMAL HIGH (ref 150–400)
RBC: 3.33 MIL/uL — ABNORMAL LOW (ref 3.87–5.11)
RDW: 14.6 % (ref 11.5–15.5)
WBC: 12.7 10*3/uL — ABNORMAL HIGH (ref 4.0–10.5)
nRBC: 0 % (ref 0.0–0.2)

## 2022-09-03 LAB — CULTURE, BLOOD (ROUTINE X 2)

## 2022-09-03 LAB — URINE CULTURE: Culture: 20000 — AB

## 2022-09-03 MED ORDER — DIPHENHYDRAMINE HCL 25 MG PO CAPS
25.0000 mg | ORAL_CAPSULE | Freq: Three times a day (TID) | ORAL | Status: DC | PRN
  Administered 2022-09-03 – 2022-09-04 (×3): 25 mg via ORAL
  Filled 2022-09-03 (×3): qty 1

## 2022-09-03 MED ORDER — DIPHENHYDRAMINE HCL 25 MG PO CAPS
25.0000 mg | ORAL_CAPSULE | Freq: Once | ORAL | Status: AC | PRN
  Administered 2022-09-03: 25 mg via ORAL
  Filled 2022-09-03: qty 1

## 2022-09-03 MED ORDER — DIPHENHYDRAMINE HCL 50 MG/ML IJ SOLN
12.5000 mg | Freq: Once | INTRAMUSCULAR | Status: AC
  Administered 2022-09-04: 12.5 mg via INTRAVENOUS
  Filled 2022-09-03: qty 1

## 2022-09-03 NOTE — Progress Notes (Signed)
NAME:  Kayla [name], MRN:  [id number], DOB:  September 21, 1950, LOS: 3 ADMISSION DATE:  08/31/2022, CONSULTATION DATE:  09/01/2022 REFERRING MD:  Dr. Adela Glimpse, [facility], CHIEF COMPLAINT:  short of breathing   History of Present Illness:  72 yo female followed by Dr. Marchelle Gearing in the pulmonary clinic for bronchiectasis and asthma with chronic cough was found to have worsening Lt lower lobe infiltrate at visit on 08/12/22.  She had bronchoscopy with bilateral washings of the mainstem bronchi, BAL of Lt and Rt lower lobes.  BAL was positive for pneumococcus and treated with augmentin.  She has a history of immunodeficiency on IVIG, but this was stopped early in 2024.  She presented to [facility] on 6/09 with progressive dyspnea and wheezing.  She was admitted for aspiration pneumonia.  Respiratory viral panel positive for rhinovirus.  PCCM consulted to assist with respiratory management.  Pertinent  Medical History  Multiple sclerosis, Hiatal hernia, Bilateral vocal cord immobility, Pneumonia, Immunodeficiency on IVIG, Hypothyroidism, Urine retention, GERD, Cervical cancer, Depression, Insomnia  Significant Hospital Events: Including procedures, antibiotic start and stop dates in addition to other pertinent events   6/09 Admit 6/10 speech therapy >> normal swallow assessment 6/11 palliative care consulted; itching ?from zosyn >> change to ceftriaxone  Studies:  Ig levels 12/22/19 >> IgA 51, IgG 289, IgM 27 (all low) PFT 10/24/21 >> FEV1 1.59 (72%), FEV1% 71, TLC 4.92 (100%), DLCO 60%, +BD CT chest 08/11/22 >> apical scarring, cylindrical BTX more in lower lobes, mucoid impaction and peribronchovascular nodularity in lower lobes Lt > Rt CT angio chest 08/31/22 >> debris in RLL bronchus, b/l lower lobe bronchial wall thickening with multifocal mucus plugging, scattered areas of GGO b/l, RLL area of consolidation  Interim History / Subjective:  Decreased chest congestion.  Breathing better.  Still feels  itchy.  Objective   Blood pressure (!) 111/54, pulse 78, temperature 98.2 F (36.8 C), temperature source Oral, resp. rate 15, height 5\' 3"  (1.6 m), weight 54.2 kg, SpO2 95 %.        Intake/Output Summary (Last 24 hours) at 09/03/2022 0856 Last data filed at 09/03/2022 7829 Gross per 24 hour  Intake 1271.17 ml  Output 1950 ml  Net -678.83 ml   Filed Weights   08/31/22 1443 09/02/22 0718  Weight: 45.4 kg 54.2 kg    Examination:  General - alert Eyes - pupils reactive ENT - no sinus tenderness, no stridor, decreased voice quality Cardiac - regular rate/rhythm, no murmur Chest - better air movement, no wheeze or crackles Abdomen - soft, non tender, + bowel sounds Extremities - no cyanosis, clubbing, or edema Skin - no rashes Neuro - normal strength, moves extremities, follows commands Psych - normal mood and behavior  Assessment & Plan:   Bilateral lower lobe community acquired pneumonia with Pneumococcal bacteremia and Rhinovirus. - day 4 of Abx, currently on ceftriaxone - f/u CXR 6/13 - might be able to transition to oral Abx in next 24 to 48 hours  Acute exacerbation of bronchiectasis and asthma. - continue yupelri, brovana, pulmicort - continue hypertonic saline nebulizer through 6/12 - prn albuterol - flutter valve, mucinex bid - will need to arrange for chest percussion vest as an outpt  Acute hypoxic respiratory failure. - goal SpO2 > 92%  Immunodeficiency. - secondary to MS immunomodulatory therapy - had to stop IVIG therapy in March 2024 after she was hospitalized for pubic ramus fracture - followed by Dr. Loralyn Freshwater with [facility] in [place], [place] -  will need follow up as outpt to assess for resumption of IVIG therapy; family reports she has appointment with immunology during week of 09/08/22  Pseudomonas in urine culture. - unclear significance  Bilateral vocal cord cord immobility with small trans-glottal airway. - followed by  Dr. Harriette Ohara with ENT at [facility]  Multiple sclerosis. - followed by Dr. Tyron Russell with neurology in [facility] she should be ready for d/c home in next 24 to 48 hrs if she continues to improve.  Labs       Latest Ref Rng & Units 09/01/2022    4:06 AM 08/31/2022    4:14 PM 08/19/2022    9:39 AM  CMP  Glucose 70 - 99 mg/dL 409  92  97   BUN 8 - 23 mg/dL 16  14  17    Creatinine 0.44 - 1.00 mg/dL 8.11  9.14  7.82   Sodium 135 - 145 mmol/L 133  132  137   Potassium 3.5 - 5.1 mmol/L 4.7  4.3  3.7   Chloride 98 - 111 mmol/L 98  98  101   CO2 22 - 32 mmol/L 24  22  24    Calcium 8.9 - 10.3 mg/dL 9.1  9.2  9.1   Total Protein 6.5 - 8.1 g/dL 6.2  6.5    Total Bilirubin 0.3 - 1.2 mg/dL 0.5  0.5    Alkaline Phos 38 - 126 U/L 90  87    AST 15 - 41 U/L 17  16    ALT 0 - 44 U/L 13  13         Latest Ref Rng & Units 09/03/2022    4:07 AM 09/01/2022    4:06 AM 08/31/2022    4:14 PM  CBC  WBC 4.0 - 10.5 K/uL 12.7  24.7  34.2   Hemoglobin 12.0 - 15.0 g/dL 9.5  95.6  21.3   Hematocrit 36.0 - 46.0 % 30.7  32.7  36.3   Platelets 150 - 400 K/uL 615  657  623    Signature:  Coralyn Helling, MD [facility] Pager - [phone] or [phone] 09/03/2022, 8:56 AM

## 2022-09-03 NOTE — Progress Notes (Signed)
   09/03/22 1510  Spiritual Encounters  Type of Visit Initial  Care provided to: Patient  Referral source Nurse (RN/NT/LPN)  Reason for visit Routine spiritual support  OnCall Visit No   Chaplain visited with patient during rounding on the unit. The patient Kayla [name] did not wish to talk and declined my visit. As I departed I advised her that is she wished to speal with a chaplain at some point later to let her nurse and someone would return.  Valerie Roys [facility]  [phone]

## 2022-09-03 NOTE — Progress Notes (Signed)
PMT no charge note.   Chart reviewed, TOC note reviewed, medication history noted.  Plan is for home health care, home based PT and patient/family considering home based palliative care. They have already contacted Hezzie Bump [facility] regarding [facility] based palliative care resources. No further inpatient PMT specific recommendations at this time. No charge Rosalin Hawking MD [facility] palliative.

## 2022-09-03 NOTE — Progress Notes (Signed)
[facility]  PROGRESS NOTE  Kayla [name] ZOX:[id number] DOB: [birth year] DOA: 08/31/2022 PCP: [name], MD   Brief HPI:   female with medical history significant of multiple sclerosis and recurrent pneumonia, COPD/asthma, GERD, hypothyroidism history of urinary retention, presented with worsening shortness of breath.  Patient has history of multiple sclerosis and recurrent pneumonia, recently had bronchoscopy done by pulmonology at that time bronchial washing grew pneumococcus and she was treated with Augmentin.  She has been off IVIG for 3 months after she was admitted for recruitments fracture. CT chest on May 20 showed mucoid impaction and changes suggestive of episode of aspiration pneumonia as well as cylindrical bronchiectasis. CT chest was was repeated and noted to be negative for PE, showed scattered groundglass opacities in the lungs bilaterally.  Bronchial wall thickening in the lower lobes bilaterally with debris in the right lower lobe bronchus and multifocal mucous plugging and consolidation of the right lung base concerning for pneumonia.    Assessment/Plan:   Bilateral pneumonia secondary to strep pneumoniae/rhinovirus/acute respiratory failure hypoxia -Seen on CT chest; procalcitonin 1.62 -Respiratory panel positive for rhinovirus -Blood cultures positive for strep pneumoniae Patient was changed over to Zosyn yesterday however she developed reaction.  She was changed back to ceftriaxone.  WBC is improving.  Respiratory status is slowly getting better. Continue to wean down oxygen.  Pseudomonas in urine culture  Patient denies any dysuria.  Insignificant colony count.  Likely colonization.  Not a true infection.  Bronchiectasis/asthma -Continue Roxy Manns, Pulmicort -PCCM following  Hypothyroidism -Continue Synthroid  Immunodeficiency/history of multiple sclerosis -IVIG has been on hold for past 3 months since she was admitted for pelvic  fracture -Follow-up allergy immunology/neurology as outpatient  History of urinary retention -Continue as needed In-N-Out cath  Bilateral vocal cord paralysis -Contributing to patient's aspiration and coughing  Hyponatremia -Mild;  Constipation -KUB showed moderate stool burden -Continue bowel regimen  Goals of care discussion -Palliative care has been consulted   DVT prophylaxis: Lovenox  Code Status: Full code  Family Communication: Discussed with patient's husband at bedside    Medications    arformoterol  15 mcg Nebulization BID   budesonide (PULMICORT) nebulizer solution  0.25 mg Nebulization BID   docusate sodium  100 mg Oral BID   enoxaparin (LOVENOX) injection  30 mg Subcutaneous Q24H   feeding supplement  1 Container Oral TID BM   FLUoxetine  40 mg Oral Daily   guaiFENesin  600 mg Oral BID   levothyroxine  100 mcg Oral Q0600   mirabegron ER  25 mg Oral Daily   multivitamin with minerals  1 tablet Oral Daily   mouth rinse  15 mL Mouth Rinse 4 times per day   pantoprazole  40 mg Oral Daily   revefenacin  175 mcg Nebulization Daily   senna  1 tablet Oral BID   tamsulosin  0.4 mg Oral Daily   temazepam  15 mg Oral QHS     Objective    Vitals:   09/03/22 0429 09/03/22 0557 09/03/22 0836 09/03/22 0942  BP: (!) 102/55 (!) 111/54    Pulse: 74 78  89  Resp: 15   18  Temp: 98.2 F (36.8 C)     TempSrc: Oral     SpO2: 96% 95% 95% 94%  Weight:      Height:       General appearance: Awake alert.  In no distress Resp: Normal effort at rest.  Coarse breath sounds bilaterally.  Few crackles  in the bases. Cardio: S1-S2 is normal regular.  No S3-S4.  No rubs murmurs or bruit GI: Abdomen is soft.  Nontender nondistended.  Bowel sounds are present normal.  No masses organomegaly Extremities: No edema.   No obvious focal neurological deficits.    Data Reviewed:  Basic Metabolic Panel: Recent Labs  Lab 08/31/22 1614 08/31/22 2131 09/01/22 0406  NA  132*  --  133*  K 4.3  --  4.7  CL 98  --  98  CO2 22  --  24  GLUCOSE 92  --  121*  BUN 14  --  16  CREATININE 0.70  --  0.66  CALCIUM 9.2  --  9.1  MG  --  2.3 2.2  PHOS  --  3.7 4.1     CBC: Recent Labs  Lab 08/31/22 1614 09/01/22 0406 09/03/22 0407  WBC 34.2* 24.7* 12.7*  NEUTROABS 30.9*  --   --   HGB 11.7* 10.4* 9.5*  HCT 36.3 32.7* 30.7*  MCV 90.3 90.8 92.2  PLT 623* 657* 615*     LFT Recent Labs  Lab 08/31/22 1614 09/01/22 0406  AST 16 17  ALT 13 13  ALKPHOS 87 90  BILITOT 0.5 0.5  PROT 6.5 6.2*  ALBUMIN 2.9* 2.6*      Antibiotics: Anti-infectives (From admission, onward)    Start     Dose/Rate Route Frequency Ordered Stop   09/02/22 1830  cefTRIAXone (ROCEPHIN) 2 g in sodium chloride 0.9 % 100 mL IVPB        2 g 200 mL/hr over 30 Minutes Intravenous Every 24 hours 09/02/22 1817     09/01/22 1800  azithromycin (ZITHROMAX) tablet 500 mg  Status:  Discontinued        500 mg Oral Daily-1800 09/01/22 0903 09/01/22 1309   09/01/22 1000  azithromycin (ZITHROMAX) 500 mg in sodium chloride 0.9 % 250 mL IVPB  Status:  Discontinued        500 mg 250 mL/hr over 60 Minutes Intravenous Every 24 hours 08/31/22 1851 09/01/22 0903   09/01/22 0200  piperacillin-tazobactam (ZOSYN) IVPB 3.375 g  Status:  Discontinued        3.375 g 12.5 mL/hr over 240 Minutes Intravenous Every 8 hours 08/31/22 1901 09/02/22 1816   08/31/22 1900  piperacillin-tazobactam (ZOSYN) IVPB 3.375 g  Status:  Discontinued        3.375 g 100 mL/hr over 30 Minutes Intravenous  Once 08/31/22 1859 09/02/22 0728   08/31/22 1845  azithromycin (ZITHROMAX) 500 mg in sodium chloride 0.9 % 250 mL IVPB        500 mg 250 mL/hr over 60 Minutes Intravenous  Once 08/31/22 1833 09/01/22 0743   08/31/22 1645  cefTRIAXone (ROCEPHIN) 1 g in sodium chloride 0.9 % 100 mL IVPB        1 g 200 mL/hr over 30 Minutes Intravenous  Once 08/31/22 1634 08/31/22 1818         Subjective   Patient mentions that  she is gradually improving.  Still not back to baseline.  Still has a cough.  Some shortness of breath mainly with exertion.  No nausea vomiting.     Osvaldo Shipper   [facility] If 7PM-7AM, please contact night-coverage at [email or web address], Office  [phone]   09/03/2022, 10:08 AM  LOS: 3 days

## 2022-09-04 ENCOUNTER — Inpatient Hospital Stay (HOSPITAL_COMMUNITY): Payer: Medicare Other

## 2022-09-04 DIAGNOSIS — J690 Pneumonitis due to inhalation of food and vomit: Secondary | ICD-10-CM | POA: Diagnosis not present

## 2022-09-04 DIAGNOSIS — J470 Bronchiectasis with acute lower respiratory infection: Secondary | ICD-10-CM | POA: Diagnosis not present

## 2022-09-04 DIAGNOSIS — B953 Streptococcus pneumoniae as the cause of diseases classified elsewhere: Secondary | ICD-10-CM | POA: Diagnosis not present

## 2022-09-04 DIAGNOSIS — R7881 Bacteremia: Secondary | ICD-10-CM | POA: Diagnosis not present

## 2022-09-04 LAB — CULTURE, BLOOD (ROUTINE X 2)
Culture: NO GROWTH
Special Requests: ADEQUATE

## 2022-09-04 LAB — CBC
HCT: 31.8 % — ABNORMAL LOW (ref 36.0–46.0)
Hemoglobin: 10.2 g/dL — ABNORMAL LOW (ref 12.0–15.0)
MCH: 29.1 pg (ref 26.0–34.0)
MCHC: 32.1 g/dL (ref 30.0–36.0)
MCV: 90.9 fL (ref 80.0–100.0)
Platelets: 621 10*3/uL — ABNORMAL HIGH (ref 150–400)
RBC: 3.5 MIL/uL — ABNORMAL LOW (ref 3.87–5.11)
RDW: 14.4 % (ref 11.5–15.5)
WBC: 9.7 10*3/uL (ref 4.0–10.5)
nRBC: 0 % (ref 0.0–0.2)

## 2022-09-04 LAB — BASIC METABOLIC PANEL
Anion gap: 8 (ref 5–15)
BUN: 7 mg/dL — ABNORMAL LOW (ref 8–23)
CO2: 28 mmol/L (ref 22–32)
Calcium: 8.8 mg/dL — ABNORMAL LOW (ref 8.9–10.3)
Chloride: 99 mmol/L (ref 98–111)
Creatinine, Ser: 0.66 mg/dL (ref 0.44–1.00)
GFR, Estimated: >60 mL/min (ref >60)
Glucose, Bld: 98 mg/dL (ref 70–99)
Potassium: 3.8 mmol/L (ref 3.5–5.1)
Sodium: 135 mmol/L (ref 135–145)

## 2022-09-04 MED ORDER — GUAIFENESIN ER 600 MG PO TB12: 600.0000 mg | ORAL_TABLET | Freq: Two times a day (BID) | ORAL | 0 refills | Status: AC

## 2022-09-04 MED ORDER — MIRABEGRON ER 25 MG PO TB24: 25.0000 mg | ORAL_TABLET | Freq: Every day | ORAL | Status: DC

## 2022-09-04 MED ORDER — CEFUROXIME AXETIL 500 MG PO TABS: 500.0000 mg | ORAL_TABLET | Freq: Two times a day (BID) | ORAL | 0 refills | Status: AC

## 2022-09-04 MED ORDER — DOCUSATE SODIUM 100 MG PO CAPS: 100.0000 mg | ORAL_CAPSULE | Freq: Two times a day (BID) | ORAL | Status: DC

## 2022-09-04 NOTE — Care Management Important Message (Signed)
Important Message  Patient Details IM Letter given Name: Kayla [name] MRN: [id number] Date of Birth: 16-Apr-1950   Medicare Important Message Given:  Yes     Caren Macadam 09/04/2022, 2:07 PM

## 2022-09-04 NOTE — Discharge Summary (Signed)
Physician Discharge Summary  Kayla [name] ZOX:[id number] DOB: March 09, 1951 DOA: 08/31/2022  PCP: [name], MD  Admit date: 08/31/2022 Discharge date: 09/04/2022  Admitted From: Home Disposition: Home  Recommendations for Outpatient Follow-up:  Follow up with PCP in 1 week with repeat CBC/BMP Outpatient follow-up with pulmonary and palliative care Follow up in ED if symptoms worsen or new appear   Home Health: No Equipment/Devices: None  Discharge Condition: Guarded  CODE STATUS: Full Diet recommendation: Heart healthy  Brief/Interim Summary: 72 year old female with history of multiple sclerosis and recurrent pneumonia, immunodeficiency, COPD/asthma, GERD, hypothyroidism, history of urinary retention, presented with worsening shortness of breath.  On presentation, CT of the chest was negative for PE, showed scattered groundglass opacities in the lungs bilaterally. Bronchial wall thickening in the lower lobes bilaterally with debris in the right lower lobe bronchus and multifocal mucous plugging and consolidation of the right lung base concerning for pneumonia.  She was started on IV antibiotics.  Respiratory panel was positive for rhinovirus.  Blood cultures positive for strep pneumoniae.  Pulmonary was consulted.  During the hospitalization, her condition has improved.  Respite status is currently stable and pulmonary has cleared the patient for discharge on 10 more days of oral antibiotics.  She will be discharged today on oral Ceftin for 10 more days.  Outpatient follow-up with pulmonary.  She will need outpatient follow-up with palliative care team as well.  Discharge Diagnoses:   Bilateral pneumonia secondary to strep pneumoniae/rhinovirus/acute respiratory failure with hypoxia Strep pneumo bacteremia -Seen on CT chest; procalcitonin 1.62 -Respiratory panel positive for rhinovirus -Blood cultures positive for strep pneumoniae -Patient was changed over to Zosyn however she  developed reaction.  She was changed back to ceftriaxone.  WBC has improved.   -Respiratory status has much improved: Currently on room air  -Pulmonary has cleared the patient for discharge on 10 more days of oral antibiotics.  Discharge home today on Ceftin for 10 more days.  Outpatient follow-up with PCP and pulmonary.  pseudomonas in urine culture  Patient denies any dysuria.  Insignificant colony count.  Likely colonization.  Not a true infection.   Bronchiectasis/asthma -Continue home inhaled regimen.  Outpatient follow-up with pulmonary   Hypothyroidism -Continue Synthroid   Immunodeficiency/history of multiple sclerosis -IVIG has been on hold for past 3 months since she was admitted for pelvic fracture -Follow-up allergy immunology/neurology as outpatient   History of urinary retention -Continue as needed In-N-Out cath   Bilateral vocal cord paralysis -Contributing to patient's aspiration and coughing.  Outpatient follow-up with ENT at [facility].   Hyponatremia -Improved   Constipation -KUB showed moderate stool burden -Continue bowel regimen   Goals of care discussion -Palliative care evaluation appreciated.  Remains full code.  Outpatient follow-up with palliative care.  Discharge Instructions  Discharge Instructions     Ambulatory referral to Physical Therapy   Complete by: As directed    Diet - low sodium heart healthy   Complete by: As directed    Increase activity slowly   Complete by: As directed       Allergies as of 09/04/2022       Reactions   Tramadol    hallucinations    Contrast Media [iodinated Contrast Media] Rash   Iohexol Hives   Morphine And Codeine Other (See Comments)   Reaction:  Hallucinations    Methylprednisolone Other (See Comments)   Reaction:  Decreases pts heart rate    Hydrocodone Itching   Oxycodone-acetaminophen Rash, Other (  See Comments)   Reaction:  Hallucinations    Phenytoin Sodium Extended Rash    Zanaflex [tizanidine Hydrochloride] Rash        Medication List     STOP taking these medications    amphetamine-dextroamphetamine 20 MG tablet Commonly known as: ADDERALL   capsaicin 0.025 % cream Commonly known as: ZOSTRIX       TAKE these medications    ACCRUFeR 30 MG Caps Generic drug: Ferric Maltol Take 1 capsule (30 mg total) by mouth in the morning and at bedtime.   albuterol 108 (90 Base) MCG/ACT inhaler Commonly known as: Ventolin HFA INHALE 2 PUFFS INTO LUNGS EVERY 6 HOURS AS NEEDED. What changed:  how much to take how to take this when to take this reasons to take this additional instructions   calcium carbonate 1250 (500 Ca) MG tablet Commonly known as: OS-CAL - dosed in mg of elemental calcium Take 1 tablet (1,250 mg total) by mouth daily with breakfast.   cefUROXime 500 MG tablet Commonly known as: CEFTIN Take 1 tablet (500 mg total) by mouth 2 (two) times daily for 10 days.   dexlansoprazole 60 MG capsule Commonly known as: Dexilant TAKE 1 CAPSULE BY MOUTH  ONCE DAILY WITH BREAKFAST   docusate sodium 100 MG capsule Commonly known as: COLACE Take 1 capsule (100 mg total) by mouth in the morning and at bedtime.   dronabinol 2.5 MG capsule Commonly known as: Marinol Take 1 capsule (2.5 mg total) by mouth 2 (two) times daily before lunch and supper.   famotidine 20 MG tablet Commonly known as: Pepcid One after supper What changed:  how much to take how to take this when to take this   FLUoxetine 40 MG capsule Commonly known as: PROZAC Take 1 capsule (40 mg total) by mouth daily.   Flutter Devi Use as directed   guaiFENesin 600 MG 12 hr tablet Commonly known as: MUCINEX Take 1 tablet (600 mg total) by mouth 2 (two) times daily.   levothyroxine 100 MCG tablet Commonly known as: SYNTHROID TAKE 1 TABLET BY MOUTH ONCE  DAILY BEFORE BREAKFAST   methenamine 1 g tablet Commonly known as: HIPREX Take 1 tablet (1 g total) by mouth 2  (two) times daily with a meal.   mirabegron ER 25 MG Tb24 tablet Commonly known as: MYRBETRIQ Take 1 tablet (25 mg total) by mouth daily.   mirtazapine 7.5 MG tablet Commonly known as: REMERON Take 1 tablet (7.5 mg total) by mouth at bedtime.   multivitamin with minerals Tabs tablet Take 1 tablet by mouth in the morning.   PROBIOTIC PO Take 1 capsule by mouth daily.   sodium chloride HYPERTONIC 3 % nebulizer solution Take by nebulization 2 (two) times daily as needed for cough. Dx J44.9   tamsulosin 0.4 MG Caps capsule Commonly known as: FLOMAX Take 0.4 mg by mouth daily.   temazepam 15 MG capsule Commonly known as: RESTORIL Take 1 capsule (15 mg total) by mouth at bedtime.   torsemide 10 MG tablet Commonly known as: DEMADEX Take 1 tablet (10 mg total) by mouth daily.   Trelegy Ellipta 100-62.5-25 MCG/ACT Aepb Generic drug: Fluticasone-Umeclidin-Vilant Inhale 1 puff into the lungs daily.   trimethoprim 100 MG tablet Commonly known as: TRIMPEX Take 100 mg by mouth daily.   Vitaline CoQ10 300 MG Wafr Generic drug: Coenzyme Q10 Take 300 mg by mouth in the morning.        Follow-up Information     [name],  MD Follow up.   Specialty: Internal Medicine Why: Contact PCP for Outpatient Physical Therapy referral Contact information: [address] [place] [address] [phone]         [name], MD. Schedule an appointment as soon as possible for a visit in 1 week(s).   Specialty: Internal Medicine Contact information: [address] [place] [address] [phone]         [facility] Follow up.   Specialty: PALLIATIVE CARE Why: [facility] will provide outpatient palliative care after discharge. Contact information: [address] [place] [id number] [phone]               Allergies  Allergen Reactions   Tramadol     hallucinations    Contrast Media  [Iodinated Contrast Media] Rash   Iohexol Hives   Morphine And Codeine Other (See Comments)    Reaction:  Hallucinations    Methylprednisolone Other (See Comments)    Reaction:  Decreases pts heart rate    Hydrocodone Itching   Oxycodone-Acetaminophen Rash and Other (See Comments)    Reaction:  Hallucinations    Phenytoin Sodium Extended Rash   Zanaflex [Tizanidine Hydrochloride] Rash    Consultations: Pulmonary/palliative care   Procedures/Studies: DG Swallowing Func-Speech Pathology  Result Date: 09/04/2022 Table formatting from the original result was not included. Modified Barium Swallow Study Patient Details Name: TERISE FORET MRN: [id number] Date of Birth: 1950-05-12 Today's Date: 09/04/2022 HPI/PMH: HPI: Patient is a 72 y.o. female with PMH: Multiple Sclerosis, recurrent PNA, COPD/asthma, GERD, hiatal hernia, hypothyroidism, bilateral vocal cord paralysis, depression, cervical cancer and h/o UTI. MBS completed in July of 2022 did not show any aspiration of liquids or solids but did show pharyngeal retention of solids which decreased with subsequent swallows. She presented to the hospital on 08/31/22 with worsening SOB. She had brochoscopy done by pulmonology recently and was started on Augmentin due to cultures growing strep PNA. She has been off of her IVIG for approximately 3 months. CT chest from 08/31/22 showed debris in RLL bronchus, b/l lower lobe bronchial wall thickening with multifocal mucus plugging, scattered areas of GGO b/l, RLL area of consolidation. MD started h her on PO diet of Dys 3 (mechanical soft) solids and nectar thick liquids.  Pt reports being very fatigued. Limited barium provided due to pt's potential ileus upon admit.  Clinical Impression: Clinical Impression: Patient presents with mild pharyngeal dypshagia mostly characterized by impaired hyo-laryngeal elevation, laryngeal closure, epiglottic deflection as epiglottis contacts posterior pharyngeal wall with swallow.   Pt anterior curvature of spine appears to contribute to impaired epiglottic deflection. An episode of retrograde propulsion of barium proximal in pharynx observed (not to oropharyngeal space).  Laryngeal penetration of thin and nectar liquids noted as well as pyriform sinus retention which pt clears with breath=hold sustained 2nd swallow.  Various postures including head turn right with and without head of chair reclined with head tilt were not effective to decrease retention.  Prominent PES noted as well.  Swallow function appears largely consisent with findings from most recent MBS in 2022.  Will provide pt with exercises to mitigate her dypshagia.  No aspiration observed and cough observed frequently. Factors that may increase risk of adverse event in presence of aspiration Rubye Oaks & Clearance Coots 2021): Factors that may increase risk of adverse event in presence of aspiration Rubye Oaks & Clearance Coots 2021): Respiratory or GI disease; Poor general health and/or compromised immunity; Reduced saliva Recommendations/Plan: Swallowing Evaluation  Recommendations Swallowing Evaluation Recommendations Recommendations: PO diet PO Diet Recommendation: Regular; Thin liquids (Level 0) Liquid Administration via: Cup; Straw Medication Administration: Other (Comment) (as tolerated) Supervision: Patient able to self-feed Swallowing strategies  : Slow rate; Small bites/sips; effortful swallow; Multiple dry swallows after each bite/sip Postural changes: Position pt fully upright for meals; Stay upright 30-60 min after meals Oral care recommendations: Oral care BID (2x/day) Treatment Plan Treatment Plan Treatment recommendations: Therapy as outlined in treatment plan below Follow-up recommendations: Follow physicians's recommendations for discharge plan and follow up therapies Functional status assessment: Patient has not had a recent decline in their functional status. Treatment frequency: Min 1x/week Treatment duration: 1 week Interventions:  Aspiration precaution training; Oropharyngeal exercises; Compensatory techniques; Patient/family education Recommendations Recommendations for follow up therapy are one component of a multi-disciplinary discharge planning process, led by the attending physician.  Recommendations may be updated based on patient status, additional functional criteria and insurance authorization. Assessment: Orofacial Exam: Orofacial Exam Oral Cavity: Oral Hygiene: WFL Oral Cavity - Dentition: Adequate natural dentition Orofacial Anatomy: WFL Oral Motor/Sensory Function: WFL Anatomy: Anatomy: Other (Comment); Suspected cervical osteophytes; Prominent cricopharyngeus Boluses Administered: Boluses Administered Boluses Administered: Thin liquids (Level 0); Mildly thick liquids (Level 2, nectar thick); Solid  Oral Impairment Domain: Oral Impairment Domain Lip Closure: No labial escape Tongue control during bolus hold: Cohesive bolus between tongue to palatal seal Bolus preparation/mastication: Timely and efficient chewing and mashing Bolus transport/lingual motion: Brisk tongue motion Oral residue: Complete oral clearance Location of oral residue : Floor of mouth Initiation of pharyngeal swallow : Pyriform sinuses  Pharyngeal Impairment Domain: Pharyngeal Impairment Domain Soft palate elevation: No bolus between soft palate (SP)/pharyngeal wall (PW) Laryngeal elevation: Partial superior movement of thyroid cartilage/partial approximation of arytenoids to epiglottic petiole Anterior hyoid excursion: Partial anterior movement Epiglottic movement: Partial inversion Laryngeal vestibule closure: Incomplete, narrow column air/contrast in laryngeal vestibule Pharyngeal stripping wave : Present - diminished Pharyngoesophageal segment opening: Partial distention/partial duration, partial obstruction of flow Tongue base retraction: Trace column of contrast or air between tongue base and PPW Pharyngeal residue: Collection of residue within or on  pharyngeal structures Location of pharyngeal residue: Valleculae; Pharyngeal wall; Pyriform sinuses; Tongue base  Esophageal Impairment Domain: Esophageal Impairment Domain Esophageal clearance upright position: Esophageal retention Pill: Esophageal Impairment Domain Esophageal clearance upright position: Esophageal retention Penetration/Aspiration Scale Score: Penetration/Aspiration Scale Score 1.  Material does not enter airway: Solid; Mildly thick liquids (Level 2, nectar thick) 2.  Material enters airway, remains ABOVE vocal cords then ejected out: Thin liquids (Level 0) Compensatory Strategies: Compensatory Strategies Compensatory strategies: Yes Effortful swallow: Effective Effective Effortful Swallow: Solid Right head turn: Ineffective Effective Right Head Turn: Thin liquid (Level 0); Mildly thick liquid (Level 2, nectar thick) Ineffective Right Head Turn: Thin liquid (Level 0); Mildly thick liquid (Level 2, nectar thick) Reclining posture: Ineffective Effective Reclining Posture: Thin liquid (Level 0); Mildly thick liquid (Level 2, nectar thick) Ineffective Reclining Posture: Thin liquid (Level 0); Mildly thick liquid (Level 2, nectar thick) Posterior head tilt: Ineffective Ineffective Posterior head tilt: Thin liquid (Level 0); Mildly thick liquid (Level 2, nectar thick)   General Information: Caregiver present: No  Diet Prior to this Study: Dysphagia 3 (mechanical soft); Mildly thick liquids (Level 2, nectar thick)   Temperature : Normal   Respiratory Status: WFL   Supplemental O2: None (Room air)   History of Recent Intubation: No  Behavior/Cognition: Alert; Cooperative; Pleasant mood Self-Feeding Abilities: Able to self-feed Baseline vocal quality/speech: Other (comment); Dysphonic Volitional Cough:  Able to elicit Volitional Swallow: Able to elicit Exam Limitations: No limitations Goal Planning: Prognosis for improved oropharyngeal function: Fair Barriers to Reach Goals: Time post onset No data recorded  Patient/Family Stated Goal: pt reports she is feeling better but complains of itching Consulted and agree with results and recommendations: Patient Pain: Pain Assessment Pain Assessment: No/denies pain Pain Score: 0 Faces Pain Scale: 4 Breathing: 0 Negative Vocalization: 0 Facial Expression: 0 Body Language: 0 Consolability: 0 PAINAD Score: 0 Pain Location: throat from coughing Pain Descriptors / Indicators: Grimacing; Discomfort Pain Intervention(s): Monitored during session End of Session: Start Time:SLP Start Time (ACUTE ONLY): 1040 Stop Time: SLP Stop Time (ACUTE ONLY): 1100 Time Calculation:SLP Time Calculation (min) (ACUTE ONLY): 20 min Charges: SLP Evaluations $ SLP Speech Visit: 1 Visit SLP Evaluations $BSS Swallow: 1 Procedure $Swallowing Treatment: 1 Procedure SLP visit diagnosis: SLP Visit Diagnosis: Dysphagia, pharyngeal phase (R13.13); Dysphagia, pharyngoesophageal phase (R13.14) Past Medical History: Past Medical History: Diagnosis Date  Arthritis   oa  Asthma   Bilateral vocal cord paralysis   Bronchiectasis (HCC)   Cervical cancer (HCC) 2005  Depression   GERD (gastroesophageal reflux disease)   Hiatal hernia   Hypothyroidism   Immunodeficiency (HCC)   Gets IVIG infusions  Insomnia   Multiple sclerosis (HCC) 1996  Osteoporosis   Pneumonia 2024, 2014, 1992  Urine retention  Past Surgical History: Past Surgical History: Procedure Laterality Date  ABDOMINAL HYSTERECTOMY  03/25/2003  for cervical cancer, complete  BRONCHIAL BIOPSY  10/26/2020  Procedure: BRONCHIAL BIOPSIES;  Surgeon: Josephine Igo, DO;  Location: [facility];  Service: Pulmonary;;  BRONCHIAL BRUSHINGS  10/26/2020  Procedure: BRONCHIAL BRUSHINGS;  Surgeon: Josephine Igo, DO;  Location: [facility];  Service: Pulmonary;;  BRONCHIAL WASHINGS  10/26/2020  Procedure: BRONCHIAL WASHINGS;  Surgeon: Josephine Igo, DO;  Location: [facility];  Service: Pulmonary;;  BRONCHIAL WASHINGS  08/19/2022  Procedure: BRONCHIAL WASHINGS;   Surgeon: Josephine Igo, DO;  Location: [facility];  Service: Cardiopulmonary;;  CATARACT EXTRACTION, BILATERAL Bilateral   lens for cataracts  CONVERSION TO TOTAL HIP Right 09/18/2015  Procedure: CONVERSION OF RIGHT HEMI ARTHROPLASTY TO TOTAL HIP ARTHROPLASTY ACETABULAR REVISON ;  Surgeon: Durene Romans, MD;  Location: [facility];  Service: Orthopedics;  Laterality: Right;  HIP ARTHROPLASTY Right 03/01/2013  Procedure: ARTHROPLASTY BIPOLAR HIP;  Surgeon: Shelda Pal, MD;  Location: [facility];  Service: Orthopedics;  Laterality: Right;  HIP CLOSED REDUCTION Right 03/12/2013  Procedure: CLOSED REDUCTION HIP;  Surgeon: [name], MD;  Location: [facility];  Service: Orthopedics;  Laterality: Right;  HIP CLOSED REDUCTION Right 03/19/2013  Procedure: CLOSED REDUCTION HIP;  Surgeon: Javier Docker, MD;  Location: [facility];  Service: Orthopedics;  Laterality: Right;  HIP CLOSED REDUCTION Right 11/10/2015  Procedure: CLOSED MANIPULATION HIP;  Surgeon: Samson Frederic, MD;  Location: [facility];  Service: Orthopedics;  Laterality: Right;  HIP CLOSED REDUCTION Right 06/05/2017  Procedure: CLOSED REDUCTION HIP;  Surgeon: Ranee Gosselin, MD;  Location: [facility];  Service: Orthopedics;  Laterality: Right;  SMALL INTESTINE SURGERY    TOTAL HIP REVISION Right 06/06/2017  Procedure: TOTAL HIP REVISION;  Surgeon: Ranee Gosselin, MD;  Location: [facility];  Service: Orthopedics;  Laterality: Right;  VESICOVAGINAL FISTULA CLOSURE W/ TAH  03/25/2003  VIDEO BRONCHOSCOPY N/A 10/26/2020  Procedure: VIDEO BRONCHOSCOPY WITH FLUORO;  Surgeon: Josephine Igo, DO;  Location: [facility];  Service: Pulmonary;  Laterality: N/A;  VIDEO BRONCHOSCOPY Left 08/19/2022  Procedure: VIDEO BRONCHOSCOPY WITH FLUORO; WITH BAL;  Surgeon: Josephine Igo, DO;  Location: [facility];  Service: Cardiopulmonary;  Laterality: Left; Rolena Infante, MS [facility] SLP Acute Rehab Services Office [phone] Chales Abrahams 09/04/2022, 10:11 AM  DG Chest Port 1 View  Result  Date: 09/04/2022 CLINICAL DATA:  [id number] CAP (community acquired pneumonia) [phone] EXAM: PORTABLE CHEST - 1 VIEW COMPARISON:  08/31/2022 FINDINGS: The focal areas of airspace consolidation previously seen in the lung bases are slightly less conspicuous but there has been some increase in scattered interstitial opacities predominantly in the lung bases. Left infrahilar airspace opacities persist. No effusion. Heart size and mediastinal contours are within normal limits. No effusion. Old fracture deformity of the proximal right humerus. IMPRESSION: 1. Persistent left infrahilar airspace opacities. 2. Slightly increased interstitial opacities in the lung bases. Electronically Signed   By: Corlis Leak M.D.   On: 09/04/2022 08:14   CT Angio Chest PE W and/or Wo Contrast  Result Date: 08/31/2022 CLINICAL DATA:  Pulmonary embolism suspected, high probability. History of COPD, asthma, recurrent pneumonia. EXAM: CT ANGIOGRAPHY CHEST WITH CONTRAST TECHNIQUE: Multidetector CT imaging of the chest was performed using the standard protocol during bolus administration of intravenous contrast. Multiplanar CT image reconstructions and MIPs were obtained to evaluate the vascular anatomy. RADIATION DOSE REDUCTION: This exam was performed according to the departmental dose-optimization program which includes automated exposure control, adjustment of the mA and/or kV according to patient size and/or use of iterative reconstruction technique. CONTRAST:  75mL OMNIPAQUE IOHEXOL 350 MG/ML SOLN COMPARISON:  08/07/2022. FINDINGS: Cardiovascular: The heart is normal in size and there is a trace pericardial effusion. There is atherosclerotic calcification of the aorta without evidence of aneurysm. Pulmonary trunk is normal in caliber. No evidence of pulmonary embolism. Evaluation is limited due to respiratory motion artifact. Mediastinum/Nodes: A prominent lymph node is present in the prevascular space measuring 9 mm. No hilar or axillary  lymphadenopathy. The trachea and esophagus are within normal limits. Lungs/Pleura: Debris is present in the right lower lobe bronchus. There is bronchial wall thickening in the lower lobes bilaterally with multifocal mucous plugging. Scattered ground-glass opacities are present in the lungs bilaterally with a basilar predominance. Consolidation is noted in the right lower lobe. No effusion or pneumothorax. Upper Abdomen: No acute abnormality. Musculoskeletal: No acute osseous abnormality. Review of the MIP images confirms the above findings. IMPRESSION: 1. No evidence of pulmonary embolism. Evaluation is limited due to respiratory motion artifact. 2. Scattered ground-glass opacities are noted in the lungs bilaterally. Bronchial wall thickening in the lower lobes bilaterally with debris in the right lower lobe bronchus and multifocal mucous plugging and consolidation of the right lung base, concerning for pneumonia. Atypical pneumonia should be considered in the differential diagnosis. 3. Aortic atherosclerosis. Electronically Signed   By: Thornell Sartorius M.D.   On: 08/31/2022 22:18   DG Abd 1 View  Result Date: 08/31/2022 CLINICAL DATA:  Constipation. EXAM: ABDOMEN - 1 VIEW COMPARISON:  Radiograph 06/27/2022, hip CT 06/19/2022 FINDINGS: Moderate stool in the right colon. No significant formed stool distally. Scattered air throughout small bowel in the central and left abdomen. Multiple surgical clips in the pelvis. Fluffy calcifications projecting over the pelvis or in the soft tissues on prior hip CT. Right hip arthroplasty. IMPRESSION: 1. Moderate stool in the right colon. No significant formed stool distally. 2. Air scattered throughout small bowel that is nondilated, possible ileus. Electronically Signed   By: Narda Rutherford M.D.   On: 08/31/2022 19:49   DG Chest 2 View  Result  Date: 08/31/2022 CLINICAL DATA:  Shortness of breath with worsening cough. EXAM: CHEST - 2 VIEW COMPARISON:  06/27/2022 FINDINGS:  Patchy bibasilar airspace disease is suspicious for pneumonia. No substantial pleural effusion. No pulmonary edema. The cardiopericardial silhouette is within normal limits for size. No acute bony abnormality. Telemetry leads overlie the chest. IMPRESSION: Patchy bibasilar airspace disease suspicious for pneumonia. Electronically Signed   By: Kennith Center M.D.   On: 08/31/2022 16:01   CT Chest Wo Contrast  Result Date: 08/11/2022 CLINICAL DATA:  Pneumonia. EXAM: CT CHEST WITHOUT CONTRAST TECHNIQUE: Multidetector CT imaging of the chest was performed following the standard protocol without IV contrast. RADIATION DOSE REDUCTION: This exam was performed according to the departmental dose-optimization program which includes automated exposure control, adjustment of the mA and/or kV according to patient size and/or use of iterative reconstruction technique. COMPARISON:  01/20/2022. FINDINGS: Cardiovascular: Atherosclerotic calcification of the aorta. Heart size normal. No pericardial effusion. Mediastinum/Nodes: 10 mm low-attenuation lesion in the left thyroid. No follow-up recommended. (Ref: J Am Coll Radiol. 2015 Feb;12(2): 143-50).No pathologically enlarged mediastinal or axillary lymph nodes. Hilar regions are difficult to definitively evaluate without IV contrast. Esophagus is grossly unremarkable. Lungs/Pleura: Biapical pleuroparenchymal scarring. Cylindrical bronchiectasis. Worsening mucoid impaction and peribronchovascular nodularity in the lower lobes, left greater than right. Minimal involvement of the inferior lingula. No pleural fluid. Debris is seen in the airway. Upper Abdomen: Probable scattered cysts in the liver. Visualized portions of the liver, adrenal glands, kidneys, spleen, pancreas, stomach and bowel are otherwise grossly unremarkable. Musculoskeletal: Old sternal fracture. L1 superior endplate compression deformity, unchanged. No worrisome lytic or sclerotic lesions. IMPRESSION: 1.  Progressive mucoid impaction and peribronchovascular nodularity in the lower lobes, left greater than right, suggesting repeated episodes of aspiration. Atypical infectious etiology is not excluded. 2. Cylindrical bronchiectasis. 3.  Aortic atherosclerosis (ICD10-I70.0). Electronically Signed   By: Leanna Battles M.D.   On: 08/11/2022 11:14      Subjective: Patient seen and examined at bedside.  Feels much better and wants to go home today.  Husband at bedside.  No fever, vomiting, chest pain reported.  Complains of intermittent shortness of breath and cough.  Discharge Exam: Vitals:   09/04/22 0513 09/04/22 0940  BP: (!) 129/59   Pulse: 76   Resp: 16   Temp: 98.7 F (37.1 C)   SpO2: 93% 93%    General: Pt is alert, awake, not in acute distress.  On room air.  Looks chronically ill and deconditioned. Cardiovascular: rate controlled, S1/S2 + Respiratory: bilateral decreased breath sounds at bases with scattered crackles Abdominal: Soft, NT, ND, bowel sounds + Extremities: no edema, no cyanosis    The results of significant diagnostics from this hospitalization (including imaging, microbiology, ancillary and laboratory) are listed below for reference.     Microbiology: Recent Results (from the past 240 hour(s))  Blood culture (routine x 2)     Status: Abnormal   Collection Time: 08/31/22  5:03 PM   Specimen: BLOOD  Result Value Ref Range Status   Specimen Description   Final    BLOOD LEFT ANTECUBITAL Performed at [facility], [address], [place], [place] [address]    Special Requests   Final    BOTTLES DRAWN AEROBIC AND ANAEROBIC Blood Culture results may not be optimal due to an excessive volume of blood received in culture bottles Performed at [facility], [address], [place], [place] [address]    Culture  Setup Time   Final  GRAM POSITIVE COCCI IN PAIRS AEROBIC BOTTLE ONLY CRITICAL RESULT CALLED TO, READ BACK BY AND  VERIFIED WITH: PHARMD AUHN P. [phone] [id number] FCP Performed at [facility], [address], [place], [place] [address]    Culture STREPTOCOCCUS PNEUMONIAE (A)  Final   Report Status 09/03/2022 FINAL  Final   Organism ID, Bacteria STREPTOCOCCUS PNEUMONIAE  Final      Susceptibility   Streptococcus pneumoniae - MIC*    ERYTHROMYCIN >=8 RESISTANT Resistant     LEVOFLOXACIN 0.5 SENSITIVE Sensitive     VANCOMYCIN 0.5 SENSITIVE Sensitive     PENICILLIN (meningitis) 1 RESISTANT Resistant     PENO - penicillin 1      PENICILLIN (non-meningitis) 1 SENSITIVE Sensitive     PENICILLIN (oral) 1 INTERMEDIATE Intermediate     CEFTRIAXONE (non-meningitis) 1 SENSITIVE Sensitive     CEFTRIAXONE (meningitis) 1 INTERMEDIATE Intermediate     * STREPTOCOCCUS PNEUMONIAE  Blood Culture ID Panel (Reflexed)     Status: Abnormal   Collection Time: 08/31/22  5:03 PM  Result Value Ref Range Status   Enterococcus faecalis NOT DETECTED NOT DETECTED Final   Enterococcus Faecium NOT DETECTED NOT DETECTED Final   Listeria monocytogenes NOT DETECTED NOT DETECTED Final   Staphylococcus species NOT DETECTED NOT DETECTED Final   Staphylococcus aureus (BCID) NOT DETECTED NOT DETECTED Final   Staphylococcus epidermidis NOT DETECTED NOT DETECTED Final   Staphylococcus lugdunensis NOT DETECTED NOT DETECTED Final   Streptococcus species DETECTED (A) NOT DETECTED Final    Comment: CRITICAL RESULT CALLED TO, READ BACK BY AND VERIFIED WITH: PHARMD AUHN P. [phone] FCP    Streptococcus agalactiae NOT DETECTED NOT DETECTED Final   Streptococcus pneumoniae DETECTED (A) NOT DETECTED Final    Comment: CRITICAL RESULT CALLED TO, READ BACK BY AND VERIFIED WITH: PHARMD AUHN P. [phone] FCP    Streptococcus pyogenes NOT DETECTED NOT DETECTED Final   A.calcoaceticus-baumannii NOT DETECTED NOT DETECTED Final   Bacteroides fragilis NOT DETECTED NOT DETECTED Final   Enterobacterales NOT DETECTED NOT DETECTED Final    Enterobacter cloacae complex NOT DETECTED NOT DETECTED Final   Escherichia coli NOT DETECTED NOT DETECTED Final   Klebsiella aerogenes NOT DETECTED NOT DETECTED Final   Klebsiella oxytoca NOT DETECTED NOT DETECTED Final   Klebsiella pneumoniae NOT DETECTED NOT DETECTED Final   Proteus species NOT DETECTED NOT DETECTED Final   Salmonella species NOT DETECTED NOT DETECTED Final   Serratia marcescens NOT DETECTED NOT DETECTED Final   Haemophilus influenzae NOT DETECTED NOT DETECTED Final   Neisseria meningitidis NOT DETECTED NOT DETECTED Final   Pseudomonas aeruginosa NOT DETECTED NOT DETECTED Final   Stenotrophomonas maltophilia NOT DETECTED NOT DETECTED Final   Candida albicans NOT DETECTED NOT DETECTED Final   Candida auris NOT DETECTED NOT DETECTED Final   Candida glabrata NOT DETECTED NOT DETECTED Final   Candida krusei NOT DETECTED NOT DETECTED Final   Candida parapsilosis NOT DETECTED NOT DETECTED Final   Candida tropicalis NOT DETECTED NOT DETECTED Final   Cryptococcus neoformans/gattii NOT DETECTED NOT DETECTED Final    Comment: Performed at [facility], [address], [place], [place] [address]  Blood culture (routine x 2)     Status: None (Preliminary result)   Collection Time: 08/31/22  5:10 PM   Specimen: BLOOD RIGHT HAND  Result Value Ref Range Status   Specimen Description   Final    BLOOD RIGHT HAND Performed at [facility], [address]  [place], [place] [address]    Special Requests   Final    BOTTLES DRAWN AEROBIC AND ANAEROBIC Blood Culture adequate volume Performed at [facility], [address], [place], [place] [address]    Culture   Final    NO GROWTH 4 DAYS Performed at [facility], [address], [place], [place] [address]    Report Status PENDING  Incomplete  Respiratory (~20 pathogens) panel by PCR     Status: Abnormal   Collection Time: 08/31/22  8:51 PM   Specimen: Nasopharyngeal Swab; Respiratory  Result  Value Ref Range Status   Adenovirus NOT DETECTED NOT DETECTED Final   Coronavirus 229E NOT DETECTED NOT DETECTED Final    Comment: (NOTE) The Coronavirus on the Respiratory Panel, DOES NOT test for the novel  Coronavirus (2019 nCoV)    Coronavirus HKU1 NOT DETECTED NOT DETECTED Final   Coronavirus NL63 NOT DETECTED NOT DETECTED Final   Coronavirus OC43 NOT DETECTED NOT DETECTED Final   Metapneumovirus NOT DETECTED NOT DETECTED Final   Rhinovirus / Enterovirus DETECTED (A) NOT DETECTED Final   Influenza A NOT DETECTED NOT DETECTED Final   Influenza B NOT DETECTED NOT DETECTED Final   Parainfluenza Virus 1 NOT DETECTED NOT DETECTED Final   Parainfluenza Virus 2 NOT DETECTED NOT DETECTED Final   Parainfluenza Virus 3 NOT DETECTED NOT DETECTED Final   Parainfluenza Virus 4 NOT DETECTED NOT DETECTED Final   Respiratory Syncytial Virus NOT DETECTED NOT DETECTED Final   Bordetella pertussis NOT DETECTED NOT DETECTED Final   Bordetella Parapertussis NOT DETECTED NOT DETECTED Final   Chlamydophila pneumoniae NOT DETECTED NOT DETECTED Final   Mycoplasma pneumoniae NOT DETECTED NOT DETECTED Final    Comment: Performed at [facility], [address], [place], [place] [address]  SARS Coronavirus 2 by RT PCR (hospital order, performed in [facility] hospital lab) *cepheid single result test* Anterior Nasal Swab     Status: None   Collection Time: 08/31/22  8:51 PM   Specimen: Anterior Nasal Swab  Result Value Ref Range Status   SARS Coronavirus 2 by RT PCR NEGATIVE NEGATIVE Final    Comment: (NOTE) SARS-CoV-2 target nucleic acids are NOT DETECTED.  The SARS-CoV-2 RNA is generally detectable in upper and lower respiratory specimens during the acute phase of infection. The lowest concentration of SARS-CoV-2 viral copies this assay can detect is 250 copies / mL. A negative result does not preclude SARS-CoV-2 infection and should not be used as the sole basis for treatment or other patient  management decisions.  A negative result may occur with improper specimen collection / handling, submission of specimen other than nasopharyngeal swab, presence of viral mutation(s) within the areas targeted by this assay, and inadequate number of viral copies (<250 copies / mL). A negative result must be combined with clinical observations, patient history, and epidemiological information.  Fact Sheet for Patients:   RoadLapTop.co.za  Fact Sheet for Healthcare Providers: http://kim-miller.com/  This test is not yet approved or  cleared by the [place] FDA and has been authorized for detection and/or diagnosis of SARS-CoV-2 by FDA under an Emergency Use Authorization (EUA).  This EUA will remain in effect (meaning this test can be used) for the duration of the COVID-19 declaration under Section 564(b)(1) of the Act, 21 U.S.C. section 360bbb-3(b)(1), unless the authorization is terminated or revoked sooner.  Performed at [facility], [address], [place], [place] [address]   MRSA Next Gen  by PCR, Nasal     Status: None   Collection Time: 08/31/22  8:51 PM   Specimen: Nasal Mucosa; Nasal Swab  Result Value Ref Range Status   MRSA by PCR Next Gen NOT DETECTED NOT DETECTED Final    Comment: (NOTE) The GeneXpert MRSA Assay (FDA approved for NASAL specimens only), is one component of a comprehensive MRSA colonization surveillance program. It is not intended to diagnose MRSA infection nor to guide or monitor treatment for MRSA infections. Test performance is not FDA approved in patients less than 69 years old. Performed at [facility], [address], [place], [place] [address]   Urine Culture     Status: Abnormal   Collection Time: 08/31/22  8:51 PM   Specimen: Urine, Random  Result Value Ref Range Status   Specimen Description   Final    URINE, RANDOM Performed at [facility], [address] [place], [place] [address]    Special Requests   Final    NONE Reflexed from [id number] Performed at [facility], [address] [place], [place] [address]    Culture 20,000 COLONIES/mL PSEUDOMONAS AERUGINOSA (A)  Final   Report Status 09/03/2022 FINAL  Final   Organism ID, Bacteria PSEUDOMONAS AERUGINOSA (A)  Final      Susceptibility   Pseudomonas aeruginosa - MIC*    CEFTAZIDIME 16 INTERMEDIATE Intermediate     CIPROFLOXACIN 1 INTERMEDIATE Intermediate     GENTAMICIN <=1 SENSITIVE Sensitive     IMIPENEM 2 SENSITIVE Sensitive     * 20,000 COLONIES/mL PSEUDOMONAS AERUGINOSA     Labs: BNP (last 3 results) Recent Labs    08/31/22 1703  BNP 106.3*   Basic Metabolic Panel: Recent Labs  Lab 08/31/22 1614 08/31/22 2131 09/01/22 0406 09/04/22 0335  NA 132*  --  133* 135  K 4.3  --  4.7 3.8  CL 98  --  98 99  CO2 22  --  24 28  GLUCOSE 92  --  121* 98  BUN 14  --  16 7*  CREATININE 0.70  --  0.66 0.66  CALCIUM 9.2  --  9.1 8.8*  MG  --  2.3 2.2  --   PHOS  --  3.7 4.1  --    Liver Function Tests: Recent Labs  Lab 08/31/22 1614 09/01/22 0406  AST 16 17  ALT 13 13  ALKPHOS 87 90  BILITOT 0.5 0.5  PROT 6.5 6.2*  ALBUMIN 2.9* 2.6*   No results for input(s): "LIPASE", "AMYLASE" in the last 168 hours. No results for input(s): "AMMONIA" in the last 168 hours. CBC: Recent Labs  Lab 08/31/22 1614 09/01/22 0406 09/03/22 0407 09/04/22 0335  WBC 34.2* 24.7* 12.7* 9.7  NEUTROABS 30.9*  --   --   --   HGB 11.7* 10.4* 9.5* 10.2*  HCT 36.3 32.7* 30.7* 31.8*  MCV 90.3 90.8 92.2 90.9  PLT 623* 657* 615* 621*   Cardiac Enzymes: Recent Labs  Lab 08/31/22 2131  CKTOTAL 33*   BNP: Invalid input(s): "POCBNP" CBG: No results for input(s): "GLUCAP" in the last 168 hours. D-Dimer No results for input(s): "DDIMER" in the last 72 hours. Hgb A1c No results for input(s): "HGBA1C" in the last 72 hours. Lipid Profile No  results for input(s): "CHOL", "HDL", "LDLCALC", "TRIG", "CHOLHDL", "LDLDIRECT" in the last 72 hours. Thyroid function studies No results for input(s): "TSH", "T4TOTAL", "T3FREE", "THYROIDAB" in the last 72 hours.  Invalid input(s): "FREET3"  Anemia work up No results for input(s): "VITAMINB12", "FOLATE", "FERRITIN", "TIBC", "IRON", "RETICCTPCT" in the last 72 hours. Urinalysis    Component Value Date/Time   COLORURINE YELLOW 08/31/2022 2051   APPEARANCEUR CLEAR 08/31/2022 2051   LABSPEC 1.019 08/31/2022 2051   PHURINE 6.0 08/31/2022 2051   GLUCOSEU NEGATIVE 08/31/2022 2051   GLUCOSEU NEGATIVE 05/26/2022 1034   HGBUR NEGATIVE 08/31/2022 2051   BILIRUBINUR NEGATIVE 08/31/2022 2051   BILIRUBINUR negative 07/10/2021 0919   KETONESUR 20 (A) 08/31/2022 2051   PROTEINUR NEGATIVE 08/31/2022 2051   UROBILINOGEN 0.2 05/26/2022 1034   NITRITE NEGATIVE 08/31/2022 2051   LEUKOCYTESUR TRACE (A) 08/31/2022 2051   Sepsis Labs Recent Labs  Lab 08/31/22 1614 09/01/22 0406 09/03/22 0407 09/04/22 0335  WBC 34.2* 24.7* 12.7* 9.7   Microbiology Recent Results (from the past 240 hour(s))  Blood culture (routine x 2)     Status: Abnormal   Collection Time: 08/31/22  5:03 PM   Specimen: BLOOD  Result Value Ref Range Status   Specimen Description   Final    BLOOD LEFT ANTECUBITAL Performed at [facility], [address], [place], [place] [address]    Special Requests   Final    BOTTLES DRAWN AEROBIC AND ANAEROBIC Blood Culture results may not be optimal due to an excessive volume of blood received in culture bottles Performed at [facility], [address], [place], [place] [address]    Culture  Setup Time   Final    GRAM POSITIVE COCCI IN PAIRS AEROBIC BOTTLE ONLY CRITICAL RESULT CALLED TO, READ BACK BY AND VERIFIED WITH: Marita Snellen [phone] FCP Performed at [facility], [address], [place], [place] [address]    Culture  STREPTOCOCCUS PNEUMONIAE (A)  Final   Report Status 09/03/2022 FINAL  Final   Organism ID, Bacteria STREPTOCOCCUS PNEUMONIAE  Final      Susceptibility   Streptococcus pneumoniae - MIC*    ERYTHROMYCIN >=8 RESISTANT Resistant     LEVOFLOXACIN 0.5 SENSITIVE Sensitive     VANCOMYCIN 0.5 SENSITIVE Sensitive     PENICILLIN (meningitis) 1 RESISTANT Resistant     PENO - penicillin 1      PENICILLIN (non-meningitis) 1 SENSITIVE Sensitive     PENICILLIN (oral) 1 INTERMEDIATE Intermediate     CEFTRIAXONE (non-meningitis) 1 SENSITIVE Sensitive     CEFTRIAXONE (meningitis) 1 INTERMEDIATE Intermediate     * STREPTOCOCCUS PNEUMONIAE  Blood Culture ID Panel (Reflexed)     Status: Abnormal   Collection Time: 08/31/22  5:03 PM  Result Value Ref Range Status   Enterococcus faecalis NOT DETECTED NOT DETECTED Final   Enterococcus Faecium NOT DETECTED NOT DETECTED Final   Listeria monocytogenes NOT DETECTED NOT DETECTED Final   Staphylococcus species NOT DETECTED NOT DETECTED Final   Staphylococcus aureus (BCID) NOT DETECTED NOT DETECTED Final   Staphylococcus epidermidis NOT DETECTED NOT DETECTED Final   Staphylococcus lugdunensis NOT DETECTED NOT DETECTED Final   Streptococcus species DETECTED (A) NOT DETECTED Final    Comment: CRITICAL RESULT CALLED TO, READ BACK BY AND VERIFIED WITH: PHARMD AUHN P. [phone] FCP    Streptococcus agalactiae NOT DETECTED NOT DETECTED Final   Streptococcus pneumoniae DETECTED (A) NOT DETECTED Final    Comment: CRITICAL RESULT CALLED TO, READ BACK BY AND VERIFIED WITH: PHARMD AUHN P. [phone] FCP    Streptococcus pyogenes NOT DETECTED NOT DETECTED Final   A.calcoaceticus-baumannii NOT DETECTED NOT DETECTED Final   Bacteroides fragilis NOT  DETECTED NOT DETECTED Final   Enterobacterales NOT DETECTED NOT DETECTED Final   Enterobacter cloacae complex NOT DETECTED NOT DETECTED Final   Escherichia coli NOT DETECTED NOT DETECTED Final   Klebsiella aerogenes NOT  DETECTED NOT DETECTED Final   Klebsiella oxytoca NOT DETECTED NOT DETECTED Final   Klebsiella pneumoniae NOT DETECTED NOT DETECTED Final   Proteus species NOT DETECTED NOT DETECTED Final   Salmonella species NOT DETECTED NOT DETECTED Final   Serratia marcescens NOT DETECTED NOT DETECTED Final   Haemophilus influenzae NOT DETECTED NOT DETECTED Final   Neisseria meningitidis NOT DETECTED NOT DETECTED Final   Pseudomonas aeruginosa NOT DETECTED NOT DETECTED Final   Stenotrophomonas maltophilia NOT DETECTED NOT DETECTED Final   Candida albicans NOT DETECTED NOT DETECTED Final   Candida auris NOT DETECTED NOT DETECTED Final   Candida glabrata NOT DETECTED NOT DETECTED Final   Candida krusei NOT DETECTED NOT DETECTED Final   Candida parapsilosis NOT DETECTED NOT DETECTED Final   Candida tropicalis NOT DETECTED NOT DETECTED Final   Cryptococcus neoformans/gattii NOT DETECTED NOT DETECTED Final    Comment: Performed at [facility], [address], [place], [place] [address]  Blood culture (routine x 2)     Status: None (Preliminary result)   Collection Time: 08/31/22  5:10 PM   Specimen: BLOOD RIGHT HAND  Result Value Ref Range Status   Specimen Description   Final    BLOOD RIGHT HAND Performed at [facility], [address], [place], [place] [address]    Special Requests   Final    BOTTLES DRAWN AEROBIC AND ANAEROBIC Blood Culture adequate volume Performed at [facility], [address], [place], [place] [address]    Culture   Final    NO GROWTH 4 DAYS Performed at [facility], [address], [place], [place] [address]    Report Status PENDING  Incomplete  Respiratory (~20 pathogens) panel by PCR     Status: Abnormal   Collection Time: 08/31/22  8:51 PM   Specimen: Nasopharyngeal Swab; Respiratory  Result Value Ref Range Status   Adenovirus NOT DETECTED NOT DETECTED Final   Coronavirus 229E NOT DETECTED NOT DETECTED Final    Comment:  (NOTE) The Coronavirus on the Respiratory Panel, DOES NOT test for the novel  Coronavirus (2019 nCoV)    Coronavirus HKU1 NOT DETECTED NOT DETECTED Final   Coronavirus NL63 NOT DETECTED NOT DETECTED Final   Coronavirus OC43 NOT DETECTED NOT DETECTED Final   Metapneumovirus NOT DETECTED NOT DETECTED Final   Rhinovirus / Enterovirus DETECTED (A) NOT DETECTED Final   Influenza A NOT DETECTED NOT DETECTED Final   Influenza B NOT DETECTED NOT DETECTED Final   Parainfluenza Virus 1 NOT DETECTED NOT DETECTED Final   Parainfluenza Virus 2 NOT DETECTED NOT DETECTED Final   Parainfluenza Virus 3 NOT DETECTED NOT DETECTED Final   Parainfluenza Virus 4 NOT DETECTED NOT DETECTED Final   Respiratory Syncytial Virus NOT DETECTED NOT DETECTED Final   Bordetella pertussis NOT DETECTED NOT DETECTED Final   Bordetella Parapertussis NOT DETECTED NOT DETECTED Final   Chlamydophila pneumoniae NOT DETECTED NOT DETECTED Final   Mycoplasma pneumoniae NOT DETECTED NOT DETECTED Final    Comment: Performed at [facility], [address] [place], [place] [address]  SARS Coronavirus 2 by RT PCR (hospital order, performed in [facility] hospital lab) *cepheid single result test* Anterior Nasal Swab     Status: None   Collection  Time: 08/31/22  8:51 PM   Specimen: Anterior Nasal Swab  Result Value Ref Range Status   SARS Coronavirus 2 by RT PCR NEGATIVE NEGATIVE Final    Comment: (NOTE) SARS-CoV-2 target nucleic acids are NOT DETECTED.  The SARS-CoV-2 RNA is generally detectable in upper and lower respiratory specimens during the acute phase of infection. The lowest concentration of SARS-CoV-2 viral copies this assay can detect is 250 copies / mL. A negative result does not preclude SARS-CoV-2 infection and should not be used as the sole basis for treatment or other patient management decisions.  A negative result may occur with improper specimen collection / handling, submission of specimen other than  nasopharyngeal swab, presence of viral mutation(s) within the areas targeted by this assay, and inadequate number of viral copies (<250 copies / mL). A negative result must be combined with clinical observations, patient history, and epidemiological information.  Fact Sheet for Patients:   RoadLapTop.co.za  Fact Sheet for Healthcare Providers: http://kim-miller.com/  This test is not yet approved or  cleared by the [place] FDA and has been authorized for detection and/or diagnosis of SARS-CoV-2 by FDA under an Emergency Use Authorization (EUA).  This EUA will remain in effect (meaning this test can be used) for the duration of the COVID-19 declaration under Section 564(b)(1) of the Act, 21 U.S.C. section 360bbb-3(b)(1), unless the authorization is terminated or revoked sooner.  Performed at [facility], [address] [place], [place] [address]   MRSA Next Gen by PCR, Nasal     Status: None   Collection Time: 08/31/22  8:51 PM   Specimen: Nasal Mucosa; Nasal Swab  Result Value Ref Range Status   MRSA by PCR Next Gen NOT DETECTED NOT DETECTED Final    Comment: (NOTE) The GeneXpert MRSA Assay (FDA approved for NASAL specimens only), is one component of a comprehensive MRSA colonization surveillance program. It is not intended to diagnose MRSA infection nor to guide or monitor treatment for MRSA infections. Test performance is not FDA approved in patients less than 9 years old. Performed at [facility], [address] [place], [place] [address]   Urine Culture     Status: Abnormal   Collection Time: 08/31/22  8:51 PM   Specimen: Urine, Random  Result Value Ref Range Status   Specimen Description   Final    URINE, RANDOM Performed at [facility], [address] [place], [place] [address]    Special Requests   Final    NONE Reflexed from [id number] Performed at [facility], [address] [place], [place] [address]    Culture 20,000 COLONIES/mL PSEUDOMONAS AERUGINOSA (A)  Final   Report Status 09/03/2022 FINAL  Final   Organism ID, Bacteria PSEUDOMONAS AERUGINOSA (A)  Final      Susceptibility   Pseudomonas aeruginosa - MIC*    CEFTAZIDIME 16 INTERMEDIATE Intermediate     CIPROFLOXACIN 1 INTERMEDIATE Intermediate     GENTAMICIN <=1 SENSITIVE Sensitive     IMIPENEM 2 SENSITIVE Sensitive     * 20,000 COLONIES/mL PSEUDOMONAS AERUGINOSA     Time coordinating discharge: 35 minutes  SIGNED:   Glade Lloyd, MD  [facility] 09/04/2022, 1:25 PM

## 2022-09-04 NOTE — Progress Notes (Signed)
NAME:  Kayla [name], MRN:  [id number], DOB:  [birth year], LOS: 4 ADMISSION DATE:  08/31/2022, CONSULTATION DATE:  09/01/2022 REFERRING MD:  Dr. Adela Glimpse, [facility], CHIEF COMPLAINT:  short of breathing   History of Present Illness:  72 yo female followed by Dr. Marchelle Gearing in the pulmonary clinic for bronchiectasis and asthma with chronic cough was found to have worsening Lt lower lobe infiltrate at visit on 08/12/22.  She had bronchoscopy with bilateral washings of the mainstem bronchi, BAL of Lt and Rt lower lobes.  BAL was positive for pneumococcus and treated with augmentin.  She has a history of immunodeficiency on IVIG, but this was stopped early in 2024.  She presented to [facility] ER on 6/09 with progressive dyspnea and wheezing.  She was admitted for aspiration pneumonia.  Respiratory viral panel positive for rhinovirus.  PCCM consulted to assist with respiratory management.  Pertinent  Medical History  Multiple sclerosis, Hiatal hernia, Bilateral vocal cord immobility, Pneumonia, Immunodeficiency on IVIG, Hypothyroidism, Urine retention, GERD, Cervical cancer, Depression, Insomnia  Significant Hospital Events: Including procedures, antibiotic start and stop dates in addition to other pertinent events   6/09 Admit 6/10 speech therapy >> normal swallow assessment 6/11 palliative care consulted; itching ?from zosyn >> change to ceftriaxone  Studies:  Ig levels 12/22/19 >> IgA 51, IgG 289, IgM 27 (all low) PFT 10/24/21 >> FEV1 1.59 (72%), FEV1% 71, TLC 4.92 (100%), DLCO 60%, +BD CT chest 08/11/22 >> apical scarring, cylindrical BTX more in lower lobes, mucoid impaction and peribronchovascular nodularity in lower lobes Lt > Rt CT angio chest 08/31/22 >> debris in RLL bronchus, b/l lower lobe bronchial wall thickening with multifocal mucus plugging, scattered areas of GGO b/l, RLL area of consolidation  Interim History / Subjective:  Breathing better.  Anxious to go home.  Objective   Blood pressure (!)  129/59, pulse 76, temperature 98.7 F (37.1 C), temperature source Oral, resp. rate 16, height 5\' 3"  (1.6 m), weight 54.2 kg, SpO2 93 %.        Intake/Output Summary (Last 24 hours) at 09/04/2022 1136 Last data filed at 09/04/2022 9629 Gross per 24 hour  Intake 1228.91 ml  Output 400 ml  Net 828.91 ml   Filed Weights   08/31/22 1443 09/02/22 0718  Weight: 45.4 kg 54.2 kg    Examination:  General - alert Eyes - pupils reactive ENT - no sinus tenderness, no stridor Cardiac - regular rate/rhythm, no murmur Chest - equal breath sounds b/l, no wheezing or rales Abdomen - soft, non tender, + bowel sounds Extremities - no cyanosis, clubbing, or edema Skin - no rashes Neuro - normal strength, moves extremities, follows commands Psych - normal mood and behavior   Assessment & Plan:   Bilateral lower lobe community acquired pneumonia with Pneumococcal bacteremia and Rhinovirus. - day 5 of Abx, can switch to oral ABx to complete 2 weeks total - will need follow up CXR as an outpt  Acute exacerbation of bronchiectasis and asthma. - resume trelegy 100 one puff daily as outpt - prn albuterol, flutter valve, mucinex - will need to arrange for chest percussion vest as an outpt  Acute hypoxic respiratory failure. - no longer needing supplemental oxygen  Immunodeficiency. - secondary to MS immunomodulatory therapy - had to stop IVIG therapy in March 2024 after she was hospitalized for pubic ramus fracture - followed by Dr. Loralyn Freshwater with [facility] in [place], [place] - will need follow up as outpt to assess for resumption of  IVIG therapy; family reports she has appointment with immunology during week of 09/08/22  Pseudomonas in urine culture. - likely colonization  Bilateral vocal cord cord immobility with small trans-glottal airway. - followed by Dr. Harriette Ohara with ENT at [facility]  Multiple sclerosis. - followed by Dr. Tyron Russell with neurology in [place]  Updated pt's husband at bedside.  Okay for d/c home.  She has pulmonary office follow up scheduled for 10/02/22 with Katie Cobb.  Please call if additional assistance needed while she is in hospital.  Labs       Latest Ref Rng & Units 09/04/2022    3:35 AM 09/01/2022    4:06 AM 08/31/2022    4:14 PM  CMP  Glucose 70 - 99 mg/dL 98  846  92   BUN 8 - 23 mg/dL 7  16  14    Creatinine 0.44 - 1.00 mg/dL 9.62  9.52  8.41   Sodium 135 - 145 mmol/L 135  133  132   Potassium 3.5 - 5.1 mmol/L 3.8  4.7  4.3   Chloride 98 - 111 mmol/L 99  98  98   CO2 22 - 32 mmol/L 28  24  22    Calcium 8.9 - 10.3 mg/dL 8.8  9.1  9.2   Total Protein 6.5 - 8.1 g/dL  6.2  6.5   Total Bilirubin 0.3 - 1.2 mg/dL  0.5  0.5   Alkaline Phos 38 - 126 U/L  90  87   AST 15 - 41 U/L  17  16   ALT 0 - 44 U/L  13  13        Latest Ref Rng & Units 09/04/2022    3:35 AM 09/03/2022    4:07 AM 09/01/2022    4:06 AM  CBC  WBC 4.0 - 10.5 K/uL 9.7  12.7  24.7   Hemoglobin 12.0 - 15.0 g/dL 32.4  9.5  40.1   Hematocrit 36.0 - 46.0 % 31.8  30.7  32.7   Platelets 150 - 400 K/uL 621  615  657    Signature:  Coralyn Helling, MD [place] [facility] Pager - [phone] or [phone] 09/04/2022, 11:36 AM

## 2022-09-04 NOTE — Procedures (Signed)
Modified Barium Swallow Study  Patient Details  Name: Kayla [name] MRN: [id number] Date of Birth: 21-Oct-1950  Today's Date: 09/04/2022  Modified Barium Swallow completed.  Full report located under Chart Review in the Imaging Section.  History of Present Illness Patient is a 72 y.o. female with PMH: Multiple Sclerosis, recurrent PNA, COPD/asthma, GERD, hiatal hernia, hypothyroidism, bilateral vocal cord paralysis, depression, cervical cancer and h/o UTI. MBS completed in July of 2022 did not show any aspiration of liquids or solids but did show pharyngeal retention of solids which decreased with subsequent swallows. She presented to the hospital on 08/31/22 with worsening SOB. She had brochoscopy done by pulmonology recently and was started on Augmentin due to cultures growing strep PNA. She has been off of her IVIG for approximately 3 months. CT chest from 08/31/22 showed debris in RLL bronchus, b/l lower lobe bronchial wall thickening with multifocal mucus plugging, scattered areas of GGO b/l, RLL area of consolidation. MD started h her on PO diet of Dys 3 (mechanical soft) solids and nectar thick liquids.  Pt reports being very fatigued.   Clinical Impression Patient presents with mild pharyngeal dypshagia mostly characterized by impaired hyo-laryngeal elevation, laryngeal closure, epiglottic deflection as epiglottis contacts posterior pharyngeal wall with swallow.  Pt anterior curvature of spine appears to contribute to impaired epiglottic deflection. An episode of retrograde propulsion of barium proximal in pharynx observed (not to oropharyngeal space).  Laryngeal penetration of thin and nectar liquids noted as well as pyriform sinus retention which pt clears with breath=hold sustained 2nd swallow.  Various postures including head turn right with and without head of chair reclined with head tilt were not effective to decrease retention.  Prominent PES noted as well.  Swallow function appears largely  consisent with findings from most recent MBS in 2022.  Will provide pt with exercises to mitigate her dypshagia.  No aspiration observed and cough observed frequently. Factors that may increase risk of adverse event in presence of aspiration [employer] 2021): Respiratory or GI disease;Poor general health and/or compromised immunity;Reduced saliva  Swallow Evaluation Recommendations Recommendations: PO diet PO Diet Recommendation: Regular;Thin liquids (Level 0) Liquid Administration via: Cup;Straw Medication Administration: Other (Comment) (as tolerated) Supervision: Patient able to self-feed Swallowing strategies  : Slow rate;Small bites/sips;effortful swallow;Multiple dry swallows after each bite/sip Postural changes: Position pt fully upright for meals;Stay upright 30-60 min after meals Oral care recommendations: Oral care BID (2x/day)    Rolena Infante, MS [facility] SLP Acute Rehab Services Office [phone]   Chales Abrahams 09/04/2022,10:10 AM

## 2022-09-04 NOTE — Plan of Care (Signed)
  Problem: Fluid Volume: Goal: Hemodynamic stability will improve Outcome: Adequate for Discharge   Problem: Clinical Measurements: Goal: Diagnostic test results will improve Outcome: Adequate for Discharge Goal: Signs and symptoms of infection will decrease Outcome: Adequate for Discharge   Problem: Respiratory: Goal: Ability to maintain adequate ventilation will improve Outcome: Adequate for Discharge   Problem: Education: Goal: Knowledge of General Education information will improve Description: Including pain rating scale, medication(s)/side effects and non-pharmacologic comfort measures Outcome: Adequate for Discharge   Problem: Health Behavior/Discharge Planning: Goal: Ability to manage health-related needs will improve Outcome: Adequate for Discharge   Problem: Clinical Measurements: Goal: Ability to maintain clinical measurements within normal limits will improve Outcome: Adequate for Discharge Goal: Will remain free from infection Outcome: Adequate for Discharge Goal: Diagnostic test results will improve Outcome: Adequate for Discharge Goal: Respiratory complications will improve Outcome: Adequate for Discharge Goal: Cardiovascular complication will be avoided Outcome: Adequate for Discharge   Problem: Activity: Goal: Risk for activity intolerance will decrease Outcome: Adequate for Discharge   Problem: Nutrition: Goal: Adequate nutrition will be maintained Outcome: Adequate for Discharge   Problem: Coping: Goal: Level of anxiety will decrease Outcome: Adequate for Discharge   Problem: Elimination: Goal: Will not experience complications related to bowel motility Outcome: Adequate for Discharge Goal: Will not experience complications related to urinary retention 09/04/2022 1353 by Delila Spence, LPN Outcome: Adequate for Discharge 09/04/2022 1148 by Alice Rieger A, LPN Outcome: Progressing   Problem: Pain Managment: Goal: General experience of  comfort will improve 09/04/2022 1353 by Delila Spence, LPN Outcome: Adequate for Discharge 09/04/2022 1148 by Alice Rieger A, LPN Outcome: Progressing   Problem: Safety: Goal: Ability to remain free from injury will improve 09/04/2022 1353 by Delila Spence, LPN Outcome: Adequate for Discharge 09/04/2022 1148 by Alice Rieger A, LPN Outcome: Progressing   Problem: Skin Integrity: Goal: Risk for impaired skin integrity will decrease Outcome: Adequate for Discharge

## 2022-09-04 NOTE — Progress Notes (Signed)
Physical Therapy Treatment Patient Details Name: KYRSTIN CAMPILLO MRN: [id number] DOB: 1950-11-26 Today's Date: 09/04/2022   History of Present Illness 72 y.o. female with medical history significant of multiple sclerosis and recurrent pneumonia, COPD/asthma, GERD, hypothyroidism history of urinary retention. Presented with worsening shortness of breath.  Patient has history of multiple sclerosis and recurrent pneumonia had bronchoscopy done by pulmonology with cultures grew strep pneumo. Dx of aspiration pneumonia.    PT Comments    Pt tolerated increased ambulation distance of 58' with RW, no loss of balance. Noted R foot drop and internal rotation of R foot, which is baseline for pt. She is mobilizing well enough to DC home.    Recommendations for follow up therapy are one component of a multi-disciplinary discharge planning process, led by the attending physician.  Recommendations may be updated based on patient status, additional functional criteria and insurance authorization.  Follow Up Recommendations       Assistance Recommended at Discharge Intermittent Supervision/Assistance  Patient can return home with the following A little help with bathing/dressing/bathroom;Assistance with cooking/housework;Assist for transportation;Help with stairs or ramp for entrance   Equipment Recommendations  None recommended by PT    Recommendations for Other Services       Precautions / Restrictions Precautions Precautions: Fall Precaution Comments: fell in March sustained pelvic fx, denies other falls in past 6 months Restrictions Weight Bearing Restrictions: No     Mobility  Bed Mobility Overal bed mobility: Needs Assistance Bed Mobility: Supine to Sit     Supine to sit: Modified independent (Device/Increase time), HOB elevated Sit to supine: Min assist   General bed mobility comments: min A for feet back into bed.    Transfers Overall transfer level: Needs assistance Equipment  used: Rolling walker (2 wheels) Transfers: Sit to/from Stand Sit to Stand: Supervision           General transfer comment: supervision for safety    Ambulation/Gait Ambulation/Gait assistance: Supervision Gait Distance (Feet): 80 Feet Assistive device: Rolling walker (2 wheels) Gait Pattern/deviations: Step-through pattern, Decreased step length - right, Decreased step length - left, Decreased dorsiflexion - right Gait velocity: decr     General Gait Details: pt wears bioness on RLE for foot drop at baseline, she does not have it here. Noted R foot drop and internal rotation of R foot with ambulation. No loss of balance   Stairs             Wheelchair Mobility    Modified Rankin (Stroke Patients Only)       Balance Overall balance assessment: History of Falls, Needs assistance Sitting-balance support: Feet supported, No upper extremity supported Sitting balance-Leahy Scale: Good     Standing balance support: Bilateral upper extremity supported, During functional activity, Reliant on assistive device for balance Standing balance-Leahy Scale: Poor                              Cognition Arousal/Alertness: Awake/alert Behavior During Therapy: WFL for tasks assessed/performed Overall Cognitive Status: Within Functional Limits for tasks assessed                                          Exercises      General Comments        Pertinent Vitals/Pain Pain Assessment Pain Assessment: No/denies pain Pain Score: 0-No pain Faces  Pain Scale: No hurt    Home Living                          Prior Function            PT Goals (current goals can now be found in the care plan section) Acute Rehab PT Goals Patient Stated Goal: likes going to the beach PT Goal Formulation: With patient/family Time For Goal Achievement: 09/15/22 Potential to Achieve Goals: Good Progress towards PT goals: Progressing toward goals     Frequency    Min 1X/week      PT Plan Current plan remains appropriate    Co-evaluation              AM-PAC PT "6 Clicks" Mobility   Outcome Measure  Help needed turning from your back to your side while in a flat bed without using bedrails?: None Help needed moving from lying on your back to sitting on the side of a flat bed without using bedrails?: A Little Help needed moving to and from a bed to a chair (including a wheelchair)?: None Help needed standing up from a chair using your arms (e.g., wheelchair or bedside chair)?: None Help needed to walk in hospital room?: None Help needed climbing 3-5 steps with a railing? : A Little 6 Click Score: 22    End of Session Equipment Utilized During Treatment: Gait belt Activity Tolerance: Patient tolerated treatment well Patient left: in bed;with call bell/phone within reach;with bed alarm set;with family/visitor present Nurse Communication: Mobility status PT Visit Diagnosis: History of falling (Z91.81);Difficulty in walking, not elsewhere classified (R26.2);Other abnormalities of gait and mobility (R26.89)     Time: 4098-1191 PT Time Calculation (min) (ACUTE ONLY): 10 min  Charges:  $Gait Training: 8-22 mins                     Ralene Bathe Kistler PT 09/04/2022  Acute Rehabilitation Services  Office [phone]

## 2022-09-04 NOTE — Plan of Care (Signed)
  Problem: Elimination: Goal: Will not experience complications related to urinary retention Outcome: Progressing   Problem: Pain Managment: Goal: General experience of comfort will improve Outcome: Progressing   Problem: Safety: Goal: Ability to remain free from injury will improve Outcome: Progressing   

## 2022-09-04 NOTE — Consult Note (Signed)
[facility] Customer service manager [facility]) Accountable Care Organization (ACO) [facility] Liaison Note  09/04/2022  Kayla [name] 05/19/1950 [id number]  Location: [facility] Liaison screened the patient remotely at [facility].   Referral review:  Due to rising risk score 3 admissions on list  Insurance: MCR ACO    NYHLA MOUNTJOY is a 72 y.o. female who is a Primary Care Patient of [name], MD. This provider is listed for the transition of care follow up for post hospital care needs.   The patient was screened for  hospitalization with noted  risk score for unplanned readmission risk with 3 IP admissions in 6 months.  The patient was assessed for potential [facility]) Care Management service needs for post hospital transition for care coordination. Review of patient's electronic medical record reveals patient is for home.   Plan: [facility] Liaison will continue to follow progress and disposition to asess for post hospital community care coordination/management needs.  Referral request for community care coordination: anticipate [facility] Transitions of Care Team follow up.   [facility] Care Management/Population Health does not replace or interfere with any arrangements made by the Inpatient Transition of Care team.   For questions contact:   Charlesetta Shanks, RN BSN CCM [facility]  [phone] business mobile phone Toll free office [phone]  *Concierge Line  [phone] Fax number: [phone] [email or web address]

## 2022-09-04 NOTE — Progress Notes (Signed)
Pt is up to unit, via bed from 4th floor. She is warm, dry, no visible distress.

## 2022-09-05 ENCOUNTER — Telehealth: Payer: Self-pay

## 2022-09-05 LAB — CULTURE, BLOOD (ROUTINE X 2)

## 2022-09-05 NOTE — Transitions of Care (Post Inpatient/ED Visit) (Signed)
09/05/2022  Name: Kayla [name] MRN: [id number] DOB: Aug 12, 1950  Today's TOC FU Call Status: Today's TOC FU Call Status:: Successful TOC FU Call Competed TOC FU Call Complete Date: 09/05/22  Transition Care Management Follow-up Telephone Call Date of Discharge: 09/04/22 Discharge Facility: [facility]) Type of Discharge: Inpatient Admission Primary Inpatient Discharge Diagnosis:: Pneumonia How have you been since you were released from the hospital?: Same (Per patient's spouse she is doing okay, has needed nebulizer treatments.) Any questions or concerns?: No  Items Reviewed: Did you receive and understand the discharge instructions provided?: Yes Medications obtained,verified, and reconciled?: Yes (Medications Reviewed) Any new allergies since your discharge?: No Dietary orders reviewed?: No Do you have support at home?: Yes People in Home: spouse Name of Support/Comfort Primary Source: Homero Fellers  Medications Reviewed Today: Medications Reviewed Today     Reviewed by Jodelle Gross, RN (Case Manager) on 09/05/22 at 1420  Med List Status: <None>   Medication Order Taking? Sig Documenting Provider Last Dose Status Informant  albuterol (VENTOLIN HFA) 108 (90 Base) MCG/ACT inhaler [id number] Yes INHALE 2 PUFFS INTO LUNGS EVERY 6 HOURS AS NEEDED.  Patient taking differently: Inhale 1 puff into the lungs every 6 (six) hours as needed for shortness of breath.   Kalman Shan, MD Taking Active Self  calcium carbonate (OS-CAL - DOSED IN MG OF ELEMENTAL CALCIUM) 1250 (500 Ca) MG tablet [id number] Yes Take 1 tablet (1,250 mg total) by mouth daily with breakfast. Arnetha Courser, MD Taking Active Self  cefUROXime (CEFTIN) 500 MG tablet [id number] Yes Take 1 tablet (500 mg total) by mouth 2 (two) times daily for 10 days. Glade Lloyd, MD Taking Active   Coenzyme Q10 (VITALINE COQ10) 300 MG WAFR [id number] No Take 300 mg by mouth in the morning. [provider] Unknown Active Self   dexlansoprazole (DEXILANT) 60 MG capsule [id number] Yes TAKE 1 CAPSULE BY MOUTH  ONCE DAILY WITH BREAKFAST [name], MD Taking Active Self  docusate sodium (COLACE) 100 MG capsule [id number] No Take 1 capsule (100 mg total) by mouth in the morning and at bedtime. Glade Lloyd, MD Unknown Active   dronabinol (MARINOL) 2.5 MG capsule [id number] Yes Take 1 capsule (2.5 mg total) by mouth 2 (two) times daily before lunch and supper. [name], MD Taking Active Self  famotidine (PEPCID) 20 MG tablet [id number] Yes One after supper  Patient taking differently: Take 20 mg by mouth at bedtime. One after supper   [name], MD Taking Active Self  Ferric Maltol (ACCRUFER) 30 MG CAPS [id number] No Take 1 capsule (30 mg total) by mouth in the morning and at bedtime. [name], MD Unknown Active Self  FLUoxetine (PROZAC) 40 MG capsule [id number] Yes Take 1 capsule (40 mg total) by mouth daily. [name], MD Taking Active Self  Fluticasone-Umeclidin-Vilant [facility] ELLIPTA) 100-62.5-25 MCG/ACT AEPB [id number] Yes Inhale 1 puff into the lungs daily. [name], MD Taking Active Self  guaiFENesin (MUCINEX) 600 MG 12 hr tablet [id number] Yes Take 1 tablet (600 mg total) by mouth 2 (two) times daily. Glade Lloyd, MD Taking Active   levothyroxine (SYNTHROID) 100 MCG tablet [id number] Yes TAKE 1 TABLET BY MOUTH ONCE  DAILY BEFORE BREAKFAST  Patient taking differently: Take 100 mcg by mouth daily before breakfast.   [name], MD Taking Active Self  methenamine (HIPREX) 1 g tablet [id number] Yes Take 1 tablet (1 g total) by mouth 2 (two) times daily with a meal. Sanda Linger  L, MD Taking Active Self  mirabegron ER (MYRBETRIQ) 25 MG TB24 tablet [id number] Yes Take 1 tablet (25 mg total) by mouth daily. Glade Lloyd, MD Taking Active   mirtazapine (REMERON) 7.5 MG tablet [id number] Yes Take 1 tablet (7.5 mg total) by mouth at bedtime. [name], MD Taking Active Self  Multiple  Vitamin (MULTIVITAMIN WITH MINERALS) TABS tablet [id number] No Take 1 tablet by mouth in the morning. [provider] Unknown Active Self  Probiotic Product (PROBIOTIC PO) [id number] No Take 1 capsule by mouth daily. [provider] Unknown Active Self  Respiratory Therapy Supplies (FLUTTER) DEVI [id number] Yes Use as directed [name], MD Taking Active Self  sodium chloride HYPERTONIC 3 % nebulizer solution [id number] Yes Take by nebulization 2 (two) times daily as needed for cough. Dx X32.3 Kalman Shan, MD Taking Active Self  tamsulosin (FLOMAX) 0.4 MG CAPS capsule [id number] Yes Take 0.4 mg by mouth daily. [provider] Taking Active Self  temazepam (RESTORIL) 15 MG capsule [id number] No Take 1 capsule (15 mg total) by mouth at bedtime. [name], MD Unknown Active Self  torsemide (DEMADEX) 10 MG tablet [id number] Yes Take 1 tablet (10 mg total) by mouth daily. [name], MD Taking Active Self  trimethoprim (TRIMPEX) 100 MG tablet [id number] Yes Take 100 mg by mouth daily. [provider] Taking Active Self            Home Care and Equipment/Supplies: Were Home Health Services Ordered?: No Any new equipment or medical supplies ordered?: No  Functional Questionnaire: Do you need assistance with bathing/showering or dressing?: Yes Do you need assistance with meal preparation?: Yes Do you need assistance with eating?: No Do you have difficulty maintaining continence: No Do you need assistance with getting out of bed/getting out of a chair/moving?: No Do you have difficulty managing or taking your medications?: No  Follow up appointments reviewed: PCP Follow-up appointment confirmed?: Yes Date of PCP follow-up appointment?: 09/09/22 Follow-up Provider: Hetty Blend, NP Specialist Hospital Follow-up appointment confirmed?: Yes Date of Specialist follow-up appointment?: 10/02/22 Follow-Up Specialty Provider:: Sherrian Divers, NP Do  you need transportation to your follow-up appointment?: No Do you understand care options if your condition(s) worsen?: Yes-patient verbalized understanding  SDOH Interventions Today    Flowsheet Row Most Recent Value  SDOH Interventions   Food Insecurity Interventions Intervention Not Indicated  Housing Interventions Intervention Not Indicated  Transportation Interventions Intervention Not Indicated      Interventions Today    Flowsheet Row Most Recent Value  Chronic Disease   Chronic disease during today's visit Chronic Obstructive Pulmonary Disease (COPD)  [Multiple Sclerosis]       TOC Interventions Today    Flowsheet Row Most Recent Value  TOC Interventions   TOC Interventions Discussed/Reviewed TOC Interventions Discussed, TOC Interventions Reviewed, Arranged PCP follow up within 7 days/Care Guide scheduled       Jodelle Gross, RN, BSN, CCM Care Management Coordinator [facility] Phone: [phone]/Fax: [phone]

## 2022-09-05 NOTE — Telephone Encounter (Signed)
LM for PT saying Dr. Elvera Lennox wanted to see her in 2 weeks for a HFU. Also let her know what Dr. York Spaniel about Hospitalist.

## 2022-09-08 ENCOUNTER — Telehealth: Payer: Self-pay | Admitting: Internal Medicine

## 2022-09-08 NOTE — Telephone Encounter (Signed)
Drenda Freeze calling from [facility] wanting permission for Dr. Yetta Barre approval to move forward  with the palliative program.

## 2022-09-08 NOTE — Telephone Encounter (Signed)
yes

## 2022-09-08 NOTE — Telephone Encounter (Signed)
Appt made

## 2022-09-08 NOTE — Telephone Encounter (Signed)
No # left had to search hosp of [place]. Phone: [phone]. Called # spoke w/Fran gave MD response.Marland KitchenRaechel Chute

## 2022-09-09 ENCOUNTER — Inpatient Hospital Stay: Payer: Medicare Other | Admitting: Family Medicine

## 2022-09-09 ENCOUNTER — Telehealth: Payer: Self-pay | Admitting: Internal Medicine

## 2022-09-09 ENCOUNTER — Ambulatory Visit (INDEPENDENT_AMBULATORY_CARE_PROVIDER_SITE_OTHER): Payer: Medicare Other | Admitting: Internal Medicine

## 2022-09-09 ENCOUNTER — Encounter: Payer: Self-pay | Admitting: Internal Medicine

## 2022-09-09 VITALS — BP 128/64 | HR 114 | Temp 98.2°F | Ht 63.0 in | Wt 96.0 lb

## 2022-09-09 DIAGNOSIS — D508 Other iron deficiency anemias: Secondary | ICD-10-CM

## 2022-09-09 DIAGNOSIS — R64 Cachexia: Secondary | ICD-10-CM | POA: Diagnosis not present

## 2022-09-09 DIAGNOSIS — J440 Chronic obstructive pulmonary disease with acute lower respiratory infection: Secondary | ICD-10-CM | POA: Diagnosis not present

## 2022-09-09 DIAGNOSIS — E038 Other specified hypothyroidism: Secondary | ICD-10-CM

## 2022-09-09 DIAGNOSIS — J449 Chronic obstructive pulmonary disease, unspecified: Secondary | ICD-10-CM | POA: Diagnosis not present

## 2022-09-09 DIAGNOSIS — E43 Unspecified severe protein-calorie malnutrition: Secondary | ICD-10-CM

## 2022-09-09 MED ORDER — MIRTAZAPINE 15 MG PO TABS
15.0000 mg | ORAL_TABLET | Freq: Every day | ORAL | 1 refills | Status: DC
Start: 2022-09-09 — End: 2023-04-02

## 2022-09-09 MED ORDER — MIRTAZAPINE 15 MG PO TBDP
15.0000 mg | ORAL_TABLET | Freq: Every day | ORAL | 1 refills | Status: DC
Start: 2022-09-09 — End: 2022-09-09

## 2022-09-09 MED ORDER — ACCRUFER 30 MG PO CAPS: 1.0000 | ORAL_CAPSULE | Freq: Two times a day (BID) | ORAL | 0 refills | Status: DC

## 2022-09-09 NOTE — Telephone Encounter (Signed)
Pharmacy wants to know if this medication should be a plain tablet or a disintegrating tablet. Please advise.  mirtazapine (REMERON SOL-TAB) 15 MG disintegrating tablet

## 2022-09-09 NOTE — Progress Notes (Signed)
Subjective:  Patient ID: Kayla [name], female    DOB: 1951-02-15  Age: 72 y.o. MRN: [id number]  CC: Anemia   HPI Kayla [name] presents for f/up ---  Discussed the use of AI scribe software for clinical note transcription with the patient, who gave verbal consent to proceed.  History of Present Illness   The patient, with a 28-year history of multiple sclerosis (MS), has been experiencing a significant decline in health since a pelvic fracture on March 28. The fracture led to a period of immobility, which initiated a downward spiral in the patient's health, including pneumonia. [name] weight has dropped nearly 20 pounds from her usual 113-115 pounds to 96 pounds, indicating severe weight loss. The patient reports a complete lack of appetite, stating that nothing tastes good, and that swallowing food is difficult. However, she denies any issues with aspiration.  [name] physical condition fluctuates, with periods of distress. She reports general body aches, including significant discomfort in the pelvic region due to the recent fracture. The patient is currently managing the pain with Tylenol but expresses hesitancy to take stronger pain medications due to dislike of the associated side effects.  [name] mobility has also been affected, with difficulty walking noted. She reports pain in her legs, which she attributes to the pelvic fracture and the strain of maintaining mobility. Despite the pain, the patient expresses a desire to continue using her legs.  The patient is also dealing with an ongoing itching sensation, believed to be an allergic reaction to a contrast agent used in a recent medical procedure. She has been on an antibiotic regimen, initially Augmentin, but currently taking Ceftin, following the identification of a strep infection.  [name] overall condition is described as the lowest it has been physically, with the patient expressing fatigue from the  long-term management of MS and the recent health complications. Despite the challenges, she denies any intention of giving up.         PCP: [name], MD   Admit date: 08/31/2022 Discharge date: 09/04/2022   Admitted From: Home Disposition: Home   Recommendations for Outpatient Follow-up:  Follow up with PCP in 1 week with repeat CBC/BMP Outpatient follow-up with pulmonary and palliative care Follow up in ED if symptoms worsen or new appear     Home Health: No Equipment/Devices: None   Discharge Condition: Guarded  CODE STATUS: Full Diet recommendation: Heart healthy   Brief/Interim Summary: 72 year old female with history of multiple sclerosis and recurrent pneumonia, immunodeficiency, COPD/asthma, GERD, hypothyroidism, history of urinary retention, presented with worsening shortness of breath.  On presentation, CT of the chest was negative for PE, showed scattered groundglass opacities in the lungs bilaterally. Bronchial wall thickening in the lower lobes bilaterally with debris in the right lower lobe bronchus and multifocal mucous plugging and consolidation of the right lung base concerning for pneumonia.  She was started on IV antibiotics.  Respiratory panel was positive for rhinovirus.  Blood cultures positive for strep pneumoniae.  Pulmonary was consulted.  During the hospitalization, her condition has improved.  Respite status is currently stable and pulmonary has cleared the patient for discharge on 10 more days of oral antibiotics.  She will be discharged today on oral Ceftin for 10 more days.  Outpatient follow-up with pulmonary.  She will need outpatient follow-up with palliative care team as well.   Discharge Diagnoses:    Bilateral pneumonia secondary to strep pneumoniae/rhinovirus/acute respiratory failure with hypoxia Strep pneumo bacteremia -  Seen on CT chest; procalcitonin 1.62 -Respiratory panel positive for rhinovirus -Blood cultures positive for strep  pneumoniae -Patient was changed over to Zosyn however she developed reaction.  She was changed back to ceftriaxone.  WBC has improved.   -Respiratory status has much improved: Currently on room air  -Pulmonary has cleared the patient for discharge on 10 more days of oral antibiotics.  Discharge home today on Ceftin for 10 more days.  Outpatient follow-up with PCP and pulmonary.   pseudomonas in urine culture  Patient denies any dysuria.  Insignificant colony count.  Likely colonization.  Not a true infection.   Bronchiectasis/asthma -Continue home inhaled regimen.  Outpatient follow-up with pulmonary   Hypothyroidism -Continue Synthroid   Immunodeficiency/history of multiple sclerosis -IVIG has been on hold for past 3 months since she was admitted for pelvic fracture -Follow-up allergy immunology/neurology as outpatient   Outpatient Medications Prior to Visit  Medication Sig Dispense Refill   albuterol (VENTOLIN HFA) 108 (90 Base) MCG/ACT inhaler INHALE 2 PUFFS INTO LUNGS EVERY 6 HOURS AS NEEDED. (Patient taking differently: Inhale 1 puff into the lungs every 6 (six) hours as needed for shortness of breath.) 18 g 1   calcium carbonate (OS-CAL - DOSED IN MG OF ELEMENTAL CALCIUM) 1250 (500 Ca) MG tablet Take 1 tablet (1,250 mg total) by mouth daily with breakfast.     cefUROXime (CEFTIN) 500 MG tablet Take 1 tablet (500 mg total) by mouth 2 (two) times daily for 10 days. 20 tablet 0   Coenzyme Q10 (VITALINE COQ10) 300 MG WAFR Take 300 mg by mouth in the morning.     dexlansoprazole (DEXILANT) 60 MG capsule TAKE 1 CAPSULE BY MOUTH  ONCE DAILY WITH BREAKFAST 90 capsule 0   docusate sodium (COLACE) 100 MG capsule Take 1 capsule (100 mg total) by mouth in the morning and at bedtime.     dronabinol (MARINOL) 2.5 MG capsule Take 1 capsule (2.5 mg total) by mouth 2 (two) times daily before lunch and supper. 60 capsule 0   famotidine (PEPCID) 20 MG tablet One after supper (Patient taking  differently: Take 20 mg by mouth at bedtime. One after supper) 30 tablet 11   FLUoxetine (PROZAC) 40 MG capsule Take 1 capsule (40 mg total) by mouth daily. 90 capsule 1   Fluticasone-Umeclidin-Vilant (TRELEGY ELLIPTA) 100-62.5-25 MCG/ACT AEPB Inhale 1 puff into the lungs daily. 120 each 1   guaiFENesin (MUCINEX) 600 MG 12 hr tablet Take 1 tablet (600 mg total) by mouth 2 (two) times daily. 30 tablet 0   levothyroxine (SYNTHROID) 100 MCG tablet TAKE 1 TABLET BY MOUTH ONCE  DAILY BEFORE BREAKFAST (Patient taking differently: Take 100 mcg by mouth daily before breakfast.) 90 tablet 3   methenamine (HIPREX) 1 g tablet Take 1 tablet (1 g total) by mouth 2 (two) times daily with a meal. 180 tablet 0   mirabegron ER (MYRBETRIQ) 25 MG TB24 tablet Take 1 tablet (25 mg total) by mouth daily.     Multiple Vitamin (MULTIVITAMIN WITH MINERALS) TABS tablet Take 1 tablet by mouth in the morning.     Probiotic Product (PROBIOTIC PO) Take 1 capsule by mouth daily.     Respiratory Therapy Supplies (FLUTTER) DEVI Use as directed 1 each 0   sodium chloride HYPERTONIC 3 % nebulizer solution Take by nebulization 2 (two) times daily as needed for cough. Dx J44.9 750 mL 1   tamsulosin (FLOMAX) 0.4 MG CAPS capsule Take 0.4 mg by mouth daily.  temazepam (RESTORIL) 15 MG capsule Take 1 capsule (15 mg total) by mouth at bedtime. 90 capsule 0   torsemide (DEMADEX) 10 MG tablet Take 1 tablet (10 mg total) by mouth daily. 90 tablet 0   trimethoprim (TRIMPEX) 100 MG tablet Take 100 mg by mouth daily.     Ferric Maltol (ACCRUFER) 30 MG CAPS Take 1 capsule (30 mg total) by mouth in the morning and at bedtime. 180 capsule 0   mirtazapine (REMERON) 7.5 MG tablet Take 1 tablet (7.5 mg total) by mouth at bedtime. 30 tablet 0   No facility-administered medications prior to visit.    ROS Review of Systems  Constitutional:  Positive for activity change, appetite change, diaphoresis, fatigue and unexpected weight change. Negative  for chills and fever.  HENT:  Positive for trouble swallowing and voice change.   Respiratory:  Positive for cough, shortness of breath, wheezing and stridor. Negative for chest tightness.   Cardiovascular:  Negative for chest pain, palpitations and leg swelling.  Gastrointestinal:  Negative for abdominal pain, constipation, diarrhea, nausea and vomiting.  Genitourinary: Negative.  Negative for difficulty urinating.  Musculoskeletal:  Positive for arthralgias and gait problem.  Skin: Negative.  Negative for rash.  Neurological:  Positive for dizziness, weakness and numbness. Negative for light-headedness.  Hematological:  Negative for adenopathy. Does not bruise/bleed easily.  Psychiatric/Behavioral: Negative.      Objective:  BP 128/64 (BP Location: Left Arm, Patient Position: Sitting, Cuff Size: Normal)   Pulse (!) 114   Temp 98.2 F (36.8 C) (Oral)   Ht 5\' 3"  (1.6 m)   Wt 96 lb (43.5 kg)   SpO2 (!) 85%   BMI 17.01 kg/m   BP Readings from Last 3 Encounters:  09/09/22 128/64  09/04/22 (!) 129/59  08/19/22 (!) 93/52    Wt Readings from Last 3 Encounters:  09/09/22 96 lb (43.5 kg)  09/02/22 119 lb 7.8 oz (54.2 kg)  08/19/22 99 lb 3.2 oz (45 kg)    Physical Exam Constitutional:      Appearance: She is cachectic. She is ill-appearing. She is not toxic-appearing or diaphoretic.  HENT:     Mouth/Throat:     Mouth: Mucous membranes are moist.  Eyes:     General: No scleral icterus.    Conjunctiva/sclera: Conjunctivae normal.  Cardiovascular:     Heart sounds: No murmur heard.    No friction rub. No gallop.  Pulmonary:     Effort: Tachypnea, accessory muscle usage and prolonged expiration present. No respiratory distress.     Breath sounds: Examination of the right-upper field reveals decreased breath sounds. Examination of the left-upper field reveals decreased breath sounds. Examination of the right-middle field reveals decreased breath sounds. Examination of the  left-middle field reveals decreased breath sounds. Examination of the right-lower field reveals decreased breath sounds. Examination of the left-lower field reveals decreased breath sounds. Decreased breath sounds present. No wheezing, rhonchi or rales.  Abdominal:     General: Abdomen is flat.     Palpations: There is no mass.     Tenderness: There is no abdominal tenderness. There is no guarding.     Hernia: No hernia is present.  Musculoskeletal:        General: No swelling.     Right lower leg: No edema.     Left lower leg: No edema.  Skin:    General: Skin is warm and dry.     Coloration: Skin is pale.  Neurological:     Mental  Status: Mental status is at baseline.     Lab Results  Component Value Date   WBC 9.7 09/04/2022   HGB 10.2 (L) 09/04/2022   HCT 31.8 (L) 09/04/2022   PLT 621 (H) 09/04/2022   GLUCOSE 98 09/04/2022   CHOL 169 01/14/2022   TRIG 157.0 (H) 01/14/2022   HDL 41.50 01/14/2022   LDLDIRECT 116.1 06/25/2010   LDLCALC 96 01/14/2022   ALT 13 09/01/2022   AST 17 09/01/2022   NA 135 09/04/2022   K 3.8 09/04/2022   CL 99 09/04/2022   CREATININE 0.66 09/04/2022   BUN 7 (L) 09/04/2022   CO2 28 09/04/2022   TSH 2.624 08/31/2022   INR 1.1 08/31/2022   HGBA1C 5.6 02/25/2013    CT Angio Chest PE W and/or Wo Contrast  Result Date: 08/31/2022 CLINICAL DATA:  Pulmonary embolism suspected, high probability. History of COPD, asthma, recurrent pneumonia. EXAM: CT ANGIOGRAPHY CHEST WITH CONTRAST TECHNIQUE: Multidetector CT imaging of the chest was performed using the standard protocol during bolus administration of intravenous contrast. Multiplanar CT image reconstructions and MIPs were obtained to evaluate the vascular anatomy. RADIATION DOSE REDUCTION: This exam was performed according to the departmental dose-optimization program which includes automated exposure control, adjustment of the mA and/or kV according to patient size and/or use of iterative reconstruction  technique. CONTRAST:  75mL OMNIPAQUE IOHEXOL 350 MG/ML SOLN COMPARISON:  08/07/2022. FINDINGS: Cardiovascular: The heart is normal in size and there is a trace pericardial effusion. There is atherosclerotic calcification of the aorta without evidence of aneurysm. Pulmonary trunk is normal in caliber. No evidence of pulmonary embolism. Evaluation is limited due to respiratory motion artifact. Mediastinum/Nodes: A prominent lymph node is present in the prevascular space measuring 9 mm. No hilar or axillary lymphadenopathy. The trachea and esophagus are within normal limits. Lungs/Pleura: Debris is present in the right lower lobe bronchus. There is bronchial wall thickening in the lower lobes bilaterally with multifocal mucous plugging. Scattered ground-glass opacities are present in the lungs bilaterally with a basilar predominance. Consolidation is noted in the right lower lobe. No effusion or pneumothorax. Upper Abdomen: No acute abnormality. Musculoskeletal: No acute osseous abnormality. Review of the MIP images confirms the above findings. IMPRESSION: 1. No evidence of pulmonary embolism. Evaluation is limited due to respiratory motion artifact. 2. Scattered ground-glass opacities are noted in the lungs bilaterally. Bronchial wall thickening in the lower lobes bilaterally with debris in the right lower lobe bronchus and multifocal mucous plugging and consolidation of the right lung base, concerning for pneumonia. Atypical pneumonia should be considered in the differential diagnosis. 3. Aortic atherosclerosis. Electronically Signed   By: Thornell Sartorius M.D.   On: 08/31/2022 22:18   DG Abd 1 View  Result Date: 08/31/2022 CLINICAL DATA:  Constipation. EXAM: ABDOMEN - 1 VIEW COMPARISON:  Radiograph 06/27/2022, hip CT 06/19/2022 FINDINGS: Moderate stool in the right colon. No significant formed stool distally. Scattered air throughout small bowel in the central and left abdomen. Multiple surgical clips in the pelvis.  Fluffy calcifications projecting over the pelvis or in the soft tissues on prior hip CT. Right hip arthroplasty. IMPRESSION: 1. Moderate stool in the right colon. No significant formed stool distally. 2. Air scattered throughout small bowel that is nondilated, possible ileus. Electronically Signed   By: Narda Rutherford M.D.   On: 08/31/2022 19:49   DG Chest 2 View  Result Date: 08/31/2022 CLINICAL DATA:  Shortness of breath with worsening cough. EXAM: CHEST - 2 VIEW COMPARISON:  06/27/2022 FINDINGS:  Patchy bibasilar airspace disease is suspicious for pneumonia. No substantial pleural effusion. No pulmonary edema. The cardiopericardial silhouette is within normal limits for size. No acute bony abnormality. Telemetry leads overlie the chest. IMPRESSION: Patchy bibasilar airspace disease suspicious for pneumonia. Electronically Signed   By: Kennith Center M.D.   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  Dispense: 90 tablet; Refill: 1  Iron deficiency anemia secondary to inadequate dietary iron intake -     ACCRUFeR; Take 1 capsule (30 mg total) by mouth in the morning and at bedtime.  Dispense: 180 capsule; Refill: 0  Protein-calorie malnutrition, severe -     Mirtazapine; Take 1 tablet (15 mg total) by mouth at bedtime.  Dispense: 90 tablet; Refill: 1  Other specified hypothyroidism- She is euthyroid.  Chronic obstructive pulmonary disease with acute lower respiratory infection (HCC)- She will complete the ceftin.     Follow-up: No follow-ups on file.  Sanda Linger, MD

## 2022-09-11 DIAGNOSIS — M810 Age-related osteoporosis without current pathological fracture: Secondary | ICD-10-CM | POA: Diagnosis not present

## 2022-09-11 DIAGNOSIS — E559 Vitamin D deficiency, unspecified: Secondary | ICD-10-CM | POA: Diagnosis not present

## 2022-09-16 ENCOUNTER — Other Ambulatory Visit: Payer: Self-pay

## 2022-09-16 ENCOUNTER — Inpatient Hospital Stay (HOSPITAL_COMMUNITY)
Admission: EM | Admit: 2022-09-16 | Discharge: 2022-09-24 | DRG: 193 | Disposition: A | Discharge status: Home / Self Care | Payer: Medicare Other | Attending: Internal Medicine | Admitting: Internal Medicine

## 2022-09-16 ENCOUNTER — Ambulatory Visit (INDEPENDENT_AMBULATORY_CARE_PROVIDER_SITE_OTHER): Payer: Medicare Other | Admitting: Acute Care

## 2022-09-16 ENCOUNTER — Encounter: Payer: Self-pay | Admitting: Acute Care

## 2022-09-16 ENCOUNTER — Emergency Department (HOSPITAL_COMMUNITY): Payer: Medicare Other

## 2022-09-16 VITALS — BP 90/58 | HR 113 | Ht 63.0 in | Wt 94.0 lb

## 2022-09-16 DIAGNOSIS — G4700 Insomnia, unspecified: Secondary | ICD-10-CM | POA: Diagnosis not present

## 2022-09-16 DIAGNOSIS — J449 Chronic obstructive pulmonary disease, unspecified: Secondary | ICD-10-CM | POA: Diagnosis not present

## 2022-09-16 DIAGNOSIS — J45901 Unspecified asthma with (acute) exacerbation: Secondary | ICD-10-CM | POA: Diagnosis not present

## 2022-09-16 DIAGNOSIS — J9601 Acute respiratory failure with hypoxia: Secondary | ICD-10-CM | POA: Diagnosis not present

## 2022-09-16 DIAGNOSIS — J3802 Paralysis of vocal cords and larynx, bilateral: Secondary | ICD-10-CM

## 2022-09-16 DIAGNOSIS — J9621 Acute and chronic respiratory failure with hypoxia: Secondary | ICD-10-CM

## 2022-09-16 DIAGNOSIS — R000 Tachycardia, unspecified: Secondary | ICD-10-CM | POA: Diagnosis not present

## 2022-09-16 DIAGNOSIS — R918 Other nonspecific abnormal finding of lung field: Secondary | ICD-10-CM | POA: Diagnosis not present

## 2022-09-16 DIAGNOSIS — E871 Hypo-osmolality and hyponatremia: Secondary | ICD-10-CM | POA: Diagnosis present

## 2022-09-16 DIAGNOSIS — Q2112 Patent foramen ovale: Secondary | ICD-10-CM

## 2022-09-16 DIAGNOSIS — E861 Hypovolemia: Secondary | ICD-10-CM | POA: Diagnosis present

## 2022-09-16 DIAGNOSIS — Z91041 Radiographic dye allergy status: Secondary | ICD-10-CM

## 2022-09-16 DIAGNOSIS — E860 Dehydration: Secondary | ICD-10-CM | POA: Diagnosis not present

## 2022-09-16 DIAGNOSIS — J189 Pneumonia, unspecified organism: Secondary | ICD-10-CM | POA: Diagnosis not present

## 2022-09-16 DIAGNOSIS — B9789 Other viral agents as the cause of diseases classified elsewhere: Secondary | ICD-10-CM | POA: Diagnosis not present

## 2022-09-16 DIAGNOSIS — I340 Nonrheumatic mitral (valve) insufficiency: Secondary | ICD-10-CM | POA: Diagnosis not present

## 2022-09-16 DIAGNOSIS — Z79899 Other long term (current) drug therapy: Secondary | ICD-10-CM

## 2022-09-16 DIAGNOSIS — D849 Immunodeficiency, unspecified: Secondary | ICD-10-CM | POA: Diagnosis not present

## 2022-09-16 DIAGNOSIS — E039 Hypothyroidism, unspecified: Secondary | ICD-10-CM | POA: Diagnosis not present

## 2022-09-16 DIAGNOSIS — R636 Underweight: Secondary | ICD-10-CM | POA: Diagnosis present

## 2022-09-16 DIAGNOSIS — Z8701 Personal history of pneumonia (recurrent): Secondary | ICD-10-CM

## 2022-09-16 DIAGNOSIS — R079 Chest pain, unspecified: Secondary | ICD-10-CM | POA: Diagnosis not present

## 2022-09-16 DIAGNOSIS — Z9842 Cataract extraction status, left eye: Secondary | ICD-10-CM

## 2022-09-16 DIAGNOSIS — G35 Multiple sclerosis: Secondary | ICD-10-CM | POA: Diagnosis not present

## 2022-09-16 DIAGNOSIS — F419 Anxiety disorder, unspecified: Secondary | ICD-10-CM | POA: Diagnosis present

## 2022-09-16 DIAGNOSIS — R062 Wheezing: Secondary | ICD-10-CM | POA: Diagnosis not present

## 2022-09-16 DIAGNOSIS — B9689 Other specified bacterial agents as the cause of diseases classified elsewhere: Secondary | ICD-10-CM | POA: Diagnosis present

## 2022-09-16 DIAGNOSIS — Z1152 Encounter for screening for COVID-19: Secondary | ICD-10-CM

## 2022-09-16 DIAGNOSIS — F32A Depression, unspecified: Secondary | ICD-10-CM | POA: Diagnosis present

## 2022-09-16 DIAGNOSIS — Y95 Nosocomial condition: Secondary | ICD-10-CM | POA: Diagnosis present

## 2022-09-16 DIAGNOSIS — B953 Streptococcus pneumoniae as the cause of diseases classified elsewhere: Secondary | ICD-10-CM | POA: Diagnosis not present

## 2022-09-16 DIAGNOSIS — J9811 Atelectasis: Secondary | ICD-10-CM | POA: Diagnosis not present

## 2022-09-16 DIAGNOSIS — Z8249 Family history of ischemic heart disease and other diseases of the circulatory system: Secondary | ICD-10-CM

## 2022-09-16 DIAGNOSIS — K219 Gastro-esophageal reflux disease without esophagitis: Secondary | ICD-10-CM | POA: Diagnosis present

## 2022-09-16 DIAGNOSIS — D801 Nonfamilial hypogammaglobulinemia: Secondary | ICD-10-CM | POA: Diagnosis present

## 2022-09-16 DIAGNOSIS — R339 Retention of urine, unspecified: Secondary | ICD-10-CM | POA: Diagnosis not present

## 2022-09-16 DIAGNOSIS — Z681 Body mass index (BMI) 19 or less, adult: Secondary | ICD-10-CM | POA: Diagnosis not present

## 2022-09-16 DIAGNOSIS — J9801 Acute bronchospasm: Secondary | ICD-10-CM

## 2022-09-16 DIAGNOSIS — J45909 Unspecified asthma, uncomplicated: Secondary | ICD-10-CM | POA: Diagnosis present

## 2022-09-16 DIAGNOSIS — R7881 Bacteremia: Secondary | ICD-10-CM | POA: Diagnosis present

## 2022-09-16 DIAGNOSIS — Z96641 Presence of right artificial hip joint: Secondary | ICD-10-CM | POA: Diagnosis present

## 2022-09-16 DIAGNOSIS — N3281 Overactive bladder: Secondary | ICD-10-CM | POA: Diagnosis present

## 2022-09-16 DIAGNOSIS — Z8541 Personal history of malignant neoplasm of cervix uteri: Secondary | ICD-10-CM

## 2022-09-16 DIAGNOSIS — B999 Unspecified infectious disease: Secondary | ICD-10-CM

## 2022-09-16 DIAGNOSIS — Z9841 Cataract extraction status, right eye: Secondary | ICD-10-CM

## 2022-09-16 DIAGNOSIS — R1310 Dysphagia, unspecified: Secondary | ICD-10-CM | POA: Diagnosis present

## 2022-09-16 DIAGNOSIS — Z90710 Acquired absence of both cervix and uterus: Secondary | ICD-10-CM

## 2022-09-16 DIAGNOSIS — A403 Sepsis due to Streptococcus pneumoniae: Secondary | ICD-10-CM | POA: Diagnosis not present

## 2022-09-16 DIAGNOSIS — J13 Pneumonia due to Streptococcus pneumoniae: Secondary | ICD-10-CM | POA: Diagnosis not present

## 2022-09-16 DIAGNOSIS — I38 Endocarditis, valve unspecified: Secondary | ICD-10-CM | POA: Diagnosis not present

## 2022-09-16 DIAGNOSIS — M810 Age-related osteoporosis without current pathological fracture: Secondary | ICD-10-CM | POA: Diagnosis present

## 2022-09-16 DIAGNOSIS — Z79890 Hormone replacement therapy (postmenopausal): Secondary | ICD-10-CM

## 2022-09-16 DIAGNOSIS — Z885 Allergy status to narcotic agent status: Secondary | ICD-10-CM

## 2022-09-16 DIAGNOSIS — D649 Anemia, unspecified: Secondary | ICD-10-CM | POA: Diagnosis present

## 2022-09-16 DIAGNOSIS — J90 Pleural effusion, not elsewhere classified: Secondary | ICD-10-CM | POA: Diagnosis not present

## 2022-09-16 DIAGNOSIS — R0600 Dyspnea, unspecified: Secondary | ICD-10-CM | POA: Diagnosis not present

## 2022-09-16 DIAGNOSIS — R0902 Hypoxemia: Secondary | ICD-10-CM | POA: Diagnosis not present

## 2022-09-16 DIAGNOSIS — R0689 Other abnormalities of breathing: Secondary | ICD-10-CM | POA: Diagnosis not present

## 2022-09-16 DIAGNOSIS — Z888 Allergy status to other drugs, medicaments and biological substances status: Secondary | ICD-10-CM

## 2022-09-16 LAB — BLOOD GAS, VENOUS
Acid-Base Excess: 2.9 mmol/L — ABNORMAL HIGH (ref 0.0–2.0)
Bicarbonate: 27.9 mmol/L (ref 20.0–28.0)
O2 Saturation: 70.8 %
Patient temperature: 37
pCO2, Ven: 43 mmHg — ABNORMAL LOW (ref 44–60)
pH, Ven: 7.42 (ref 7.25–7.43)
pO2, Ven: 42 mmHg (ref 32–45)

## 2022-09-16 LAB — CBC WITH DIFFERENTIAL/PLATELET
Abs Immature Granulocytes: 0.7 10*3/uL — ABNORMAL HIGH (ref 0.00–0.07)
Basophils Absolute: 0.2 10*3/uL — ABNORMAL HIGH (ref 0.0–0.1)
Basophils Relative: 0 %
Eosinophils Absolute: 0 10*3/uL (ref 0.0–0.5)
Eosinophils Relative: 0 %
HCT: 32.6 % — ABNORMAL LOW (ref 36.0–46.0)
Hemoglobin: 10.4 g/dL — ABNORMAL LOW (ref 12.0–15.0)
Immature Granulocytes: 2 %
Lymphocytes Relative: 3 %
Lymphs Abs: 1.2 10*3/uL (ref 0.7–4.0)
MCH: 28.7 pg (ref 26.0–34.0)
MCHC: 31.9 g/dL (ref 30.0–36.0)
MCV: 90.1 fL (ref 80.0–100.0)
Monocytes Absolute: 1 10*3/uL (ref 0.1–1.0)
Monocytes Relative: 3 %
Neutro Abs: 36.8 10*3/uL — ABNORMAL HIGH (ref 1.7–7.7)
Neutrophils Relative %: 92 %
Platelets: 815 10*3/uL — ABNORMAL HIGH (ref 150–400)
RBC: 3.62 MIL/uL — ABNORMAL LOW (ref 3.87–5.11)
RDW: 14.7 % (ref 11.5–15.5)
WBC: 40 10*3/uL — ABNORMAL HIGH (ref 4.0–10.5)
nRBC: 0 % (ref 0.0–0.2)

## 2022-09-16 LAB — URINALYSIS, ROUTINE W REFLEX MICROSCOPIC
Bacteria, UA: NONE SEEN
Bacteria, UA: NONE SEEN
Bilirubin Urine: NEGATIVE
Bilirubin Urine: NEGATIVE
Glucose, UA: NEGATIVE mg/dL
Glucose, UA: NEGATIVE mg/dL
Ketones, ur: 5 mg/dL — AB
Ketones, ur: 5 mg/dL — AB
Nitrite: POSITIVE — AB
Nitrite: NEGATIVE
Protein, ur: 30 mg/dL — AB
Protein, ur: NEGATIVE mg/dL
Specific Gravity, Urine: 1.01 (ref 1.005–1.030)
Specific Gravity, Urine: 1.006 (ref 1.005–1.030)
WBC, UA: >50 WBC/hpf (ref 0–5)
WBC, UA: >50 WBC/hpf (ref 0–5)
pH: 6 (ref 5.0–8.0)
pH: 6 (ref 5.0–8.0)

## 2022-09-16 LAB — COMPREHENSIVE METABOLIC PANEL
ALT: 17 U/L (ref 0–44)
AST: 19 U/L (ref 15–41)
Albumin: 2.8 g/dL — ABNORMAL LOW (ref 3.5–5.0)
Alkaline Phosphatase: 109 U/L (ref 38–126)
Anion gap: 13 (ref 5–15)
BUN: 12 mg/dL (ref 8–23)
CO2: 24 mmol/L (ref 22–32)
Calcium: 9.5 mg/dL (ref 8.9–10.3)
Chloride: 92 mmol/L — ABNORMAL LOW (ref 98–111)
Creatinine, Ser: 0.74 mg/dL (ref 0.44–1.00)
GFR, Estimated: >60 mL/min (ref >60)
Glucose, Bld: 127 mg/dL — ABNORMAL HIGH (ref 70–99)
Potassium: 3.9 mmol/L (ref 3.5–5.1)
Sodium: 129 mmol/L — ABNORMAL LOW (ref 135–145)
Total Bilirubin: 0.5 mg/dL (ref 0.3–1.2)
Total Protein: 6.8 g/dL (ref 6.5–8.1)

## 2022-09-16 LAB — PROCALCITONIN: Procalcitonin: 0.41 ng/mL

## 2022-09-16 LAB — MRSA NEXT GEN BY PCR, NASAL: MRSA by PCR Next Gen: NOT DETECTED

## 2022-09-16 LAB — LACTIC ACID, PLASMA: Lactic Acid, Venous: 1.4 mmol/L (ref 0.5–1.9)

## 2022-09-16 MED ORDER — SODIUM CHLORIDE 0.9 % IV SOLN
2.0000 g | Freq: Once | INTRAVENOUS | Status: AC
  Administered 2022-09-16: 2 g via INTRAVENOUS
  Filled 2022-09-16: qty 12.5

## 2022-09-16 MED ORDER — MIRTAZAPINE 15 MG PO TABS
15.0000 mg | ORAL_TABLET | Freq: Every day | ORAL | Status: DC
  Administered 2022-09-16 – 2022-09-23 (×8): 15 mg via ORAL
  Filled 2022-09-16 (×8): qty 1

## 2022-09-16 MED ORDER — SODIUM CHLORIDE 0.9 % IV BOLUS (SEPSIS)
500.0000 mL | Freq: Once | INTRAVENOUS | Status: AC
  Administered 2022-09-16: 500 mL via INTRAVENOUS

## 2022-09-16 MED ORDER — ARFORMOTEROL TARTRATE 15 MCG/2ML IN NEBU
15.0000 ug | INHALATION_SOLUTION | Freq: Two times a day (BID) | RESPIRATORY_TRACT | Status: DC
  Administered 2022-09-16 – 2022-09-24 (×16): 15 ug via RESPIRATORY_TRACT
  Filled 2022-09-16 (×16): qty 2

## 2022-09-16 MED ORDER — REVEFENACIN 175 MCG/3ML IN SOLN
175.0000 ug | Freq: Every day | RESPIRATORY_TRACT | Status: DC
  Administered 2022-09-17 – 2022-09-24 (×7): 175 ug via RESPIRATORY_TRACT
  Filled 2022-09-16 (×7): qty 3

## 2022-09-16 MED ORDER — ORAL CARE MOUTH RINSE: 15.0000 mL | OROMUCOSAL | Status: DC | PRN

## 2022-09-16 MED ORDER — VANCOMYCIN HCL IN DEXTROSE 1-5 GM/200ML-% IV SOLN
1000.0000 mg | INTRAVENOUS | Status: DC
  Administered 2022-09-18: 1000 mg via INTRAVENOUS
  Filled 2022-09-16: qty 200

## 2022-09-16 MED ORDER — FLUTICASONE FUROATE-VILANTEROL 100-25 MCG/ACT IN AEPB: 1.0000 | INHALATION_SPRAY | Freq: Every day | RESPIRATORY_TRACT | Status: DC

## 2022-09-16 MED ORDER — CHLORHEXIDINE GLUCONATE CLOTH 2 % EX PADS
6.0000 | MEDICATED_PAD | Freq: Every day | CUTANEOUS | Status: DC
  Administered 2022-09-16 – 2022-09-24 (×9): 6 via TOPICAL

## 2022-09-16 MED ORDER — UMECLIDINIUM BROMIDE 62.5 MCG/ACT IN AEPB: 1.0000 | INHALATION_SPRAY | Freq: Every day | RESPIRATORY_TRACT | Status: DC

## 2022-09-16 MED ORDER — TAMSULOSIN HCL 0.4 MG PO CAPS
0.4000 mg | ORAL_CAPSULE | Freq: Every day | ORAL | Status: DC
  Administered 2022-09-17 – 2022-09-24 (×7): 0.4 mg via ORAL
  Filled 2022-09-16 (×8): qty 1

## 2022-09-16 MED ORDER — ALBUTEROL SULFATE (2.5 MG/3ML) 0.083% IN NEBU
5.0000 mg | INHALATION_SOLUTION | Freq: Once | RESPIRATORY_TRACT | Status: AC
  Administered 2022-09-16: 5 mg via RESPIRATORY_TRACT
  Filled 2022-09-16: qty 6

## 2022-09-16 MED ORDER — ONDANSETRON HCL 4 MG PO TABS: 4.0000 mg | ORAL_TABLET | Freq: Four times a day (QID) | ORAL | Status: DC | PRN

## 2022-09-16 MED ORDER — FLUOXETINE HCL 20 MG PO CAPS
40.0000 mg | ORAL_CAPSULE | Freq: Every day | ORAL | Status: DC
  Administered 2022-09-17 – 2022-09-24 (×7): 40 mg via ORAL
  Filled 2022-09-16 (×8): qty 2

## 2022-09-16 MED ORDER — ENSURE ENLIVE PO LIQD
237.0000 mL | Freq: Two times a day (BID) | ORAL | Status: DC
  Administered 2022-09-17 – 2022-09-18 (×3): 237 mL via ORAL

## 2022-09-16 MED ORDER — VANCOMYCIN HCL 750 MG/150ML IV SOLN
750.0000 mg | Freq: Once | INTRAVENOUS | Status: AC
  Administered 2022-09-16: 750 mg via INTRAVENOUS
  Filled 2022-09-16: qty 150

## 2022-09-16 MED ORDER — ALBUTEROL SULFATE (2.5 MG/3ML) 0.083% IN NEBU
2.5000 mg | INHALATION_SOLUTION | RESPIRATORY_TRACT | Status: DC | PRN
  Administered 2022-09-17 – 2022-09-23 (×10): 2.5 mg via RESPIRATORY_TRACT
  Filled 2022-09-16 (×11): qty 3

## 2022-09-16 MED ORDER — DEXTROSE 5 % IV SOLN: 500.0000 mg | INTRAVENOUS | Status: DC

## 2022-09-16 MED ORDER — LACTATED RINGERS IV BOLUS
1000.0000 mL | Freq: Once | INTRAVENOUS | Status: AC
  Administered 2022-09-16: 1000 mL via INTRAVENOUS

## 2022-09-16 MED ORDER — GUAIFENESIN ER 600 MG PO TB12
600.0000 mg | ORAL_TABLET | Freq: Two times a day (BID) | ORAL | Status: DC
  Administered 2022-09-17 – 2022-09-24 (×14): 600 mg via ORAL
  Filled 2022-09-16 (×15): qty 1

## 2022-09-16 MED ORDER — TEMAZEPAM 15 MG PO CAPS
15.0000 mg | ORAL_CAPSULE | Freq: Every day | ORAL | Status: DC
  Administered 2022-09-16 – 2022-09-23 (×8): 15 mg via ORAL
  Filled 2022-09-16 (×8): qty 1

## 2022-09-16 MED ORDER — LEVALBUTEROL HCL 0.63 MG/3ML IN NEBU
0.6300 mg | INHALATION_SOLUTION | Freq: Once | RESPIRATORY_TRACT | Status: DC
Start: 2022-09-16 — End: 2022-10-24

## 2022-09-16 MED ORDER — METHYLPREDNISOLONE SODIUM SUCC 125 MG IJ SOLR
125.0000 mg | Freq: Once | INTRAMUSCULAR | Status: AC
  Administered 2022-09-16: 125 mg via INTRAVENOUS
  Filled 2022-09-16: qty 2

## 2022-09-16 MED ORDER — METHYLPREDNISOLONE SODIUM SUCC 40 MG IJ SOLR
40.0000 mg | Freq: Every day | INTRAMUSCULAR | Status: AC
  Administered 2022-09-17: 40 mg via INTRAVENOUS
  Filled 2022-09-16: qty 1

## 2022-09-16 MED ORDER — SODIUM CHLORIDE 0.9 % IV SOLN: 2.0000 g | INTRAVENOUS | Status: DC

## 2022-09-16 MED ORDER — PANTOPRAZOLE SODIUM 40 MG PO TBEC
40.0000 mg | DELAYED_RELEASE_TABLET | Freq: Every day | ORAL | Status: DC
  Administered 2022-09-17 – 2022-09-24 (×7): 40 mg via ORAL
  Filled 2022-09-16 (×8): qty 1

## 2022-09-16 MED ORDER — LACTATED RINGERS IV SOLN: INTRAVENOUS | Status: AC

## 2022-09-16 MED ORDER — MIRABEGRON ER 25 MG PO TB24
50.0000 mg | ORAL_TABLET | Freq: Every day | ORAL | Status: DC
  Administered 2022-09-17 – 2022-09-24 (×7): 50 mg via ORAL
  Filled 2022-09-16 (×8): qty 2

## 2022-09-16 MED ORDER — LEVOTHYROXINE SODIUM 100 MCG PO TABS
100.0000 ug | ORAL_TABLET | Freq: Every day | ORAL | Status: DC
  Administered 2022-09-17 – 2022-09-24 (×7): 100 ug via ORAL
  Filled 2022-09-16 (×7): qty 1

## 2022-09-16 MED ORDER — SODIUM CHLORIDE 0.9 % IV SOLN
2.0000 g | Freq: Two times a day (BID) | INTRAVENOUS | Status: DC
  Administered 2022-09-16 – 2022-09-17 (×2): 2 g via INTRAVENOUS
  Filled 2022-09-16 (×2): qty 12.5

## 2022-09-16 MED ORDER — FLUOXETINE HCL 40 MG PO CAPS: 40.0000 mg | ORAL_CAPSULE | Freq: Every day | ORAL | Status: DC

## 2022-09-16 MED ORDER — BUDESONIDE 0.25 MG/2ML IN SUSP
0.2500 mg | Freq: Two times a day (BID) | RESPIRATORY_TRACT | Status: DC
  Administered 2022-09-16 – 2022-09-24 (×16): 0.25 mg via RESPIRATORY_TRACT
  Filled 2022-09-16 (×16): qty 2

## 2022-09-16 MED ORDER — ONDANSETRON HCL 4 MG/2ML IJ SOLN: 4.0000 mg | Freq: Four times a day (QID) | INTRAMUSCULAR | Status: DC | PRN

## 2022-09-16 MED ORDER — ENOXAPARIN SODIUM 30 MG/0.3ML IJ SOSY
30.0000 mg | PREFILLED_SYRINGE | INTRAMUSCULAR | Status: DC
  Administered 2022-09-16 – 2022-09-21 (×6): 30 mg via SUBCUTANEOUS
  Filled 2022-09-16 (×6): qty 0.3

## 2022-09-16 MED ORDER — SENNOSIDES-DOCUSATE SODIUM 8.6-50 MG PO TABS: 1.0000 | ORAL_TABLET | Freq: Every evening | ORAL | Status: DC | PRN

## 2022-09-16 MED ORDER — SODIUM CHLORIDE 0.9 % IV SOLN: 1.0000 g | Freq: Once | INTRAVENOUS | Status: DC

## 2022-09-16 NOTE — Consult Note (Addendum)
NAME:  Kayla [name], MRN:  [id number], DOB:  1951/01/21, LOS: 0 ADMISSION DATE:  09/16/2022, CONSULTATION DATE:  09/16/22  REFERRING MD:  Dr Rosalia Hammers, MD , CHIEF COMPLAINT:  SOB   History of Present Illness:  Kayla [name] is a 72 year old female with PMH notable for Multiple sclerosis, hiatal hernia, bilateral vocal cord immobility, pneumonia, immunodeficiency on IVIG, hypothyroidism, urine retention, GERD, cervical cancer, depression, insomnia. Pt is notably followed by Dr. Marchelle Gearing in the pulmonary clinic for bronchiectasis and astham with chronic cough. Was found to have worsening LLL infiltrate at visit on 08/12/22. Pt has bronchoscopy with bilateral washings of the mainstem bronchi, BAL of Lt and Rt lower lobes. BAL was positive for pneumococcus and treated with Augmentin. Pt had a history of immunodeficiency on IVIG, but this was stopped early in 2024. She presented to [facility] on 6/09 with progressive dyspnea and wheezing. Pt was admitted for aspiration PNA and specifically RLL with multifocal mucus plugging. RVP positive for rhinovirus. Cultures were positive and PCCM was consulted to assist with respiratory management. Pt was treated with ABX, steroids, mucolytic's and chest physiotherapy. She was subsequently discharged on 09/04/2022 and advised to follow up with her PCP. Pt states she followed up with PCP last week and stopped her Ceftin ABX therapy on day 8 of a 10 day course due to some adverse reactions secondary to Ceftin- as stated by pt. She has Immunodeficiency on IVIG and was receiving IVIG 11/2020- stopped 05/2022. Pt stopped her infusions for an unknown reason at that time. Since then she has had recurrent pneumonias. She also had pseudomonas in urine culture this admission which is thought to be colonization.  Today 09/16/2022 pt arrives back to pulmonolgy for follow up. Upon arrival she was in respiratory distress. Saturations were 81% on RA and she has audible wheezing accompanied with  coughing. Pt was noted to be grasping for air at that time utilizing accessory muscles, nasal flaring, and sternal retractions noted. She was not discharged with oxygen, and her sats have been very low (in the 70's) for 24-48 hours per her husband who is also at bedside. They have not been using albuterol nebulizers. Pt was placed on 4L with initial adequate response in SPO2 however required another bump to 6L to maintain saturations 96-100%.   PCCM was consulted for ongoing treatment and assistance of management with above issues.  Pertinent  Medical History  Multiple sclerosis, hiatal hernia, bilateral vocal cord immobility, pneumonia, immunodeficiency on IVIG, hypothyroidism, urine retention, GERD, cervical cancer, depression, insomnia.  Significant Hospital Events: Including procedures, antibiotic start and stop dates in addition to other pertinent events   6/09- admit 6/10 speech therapy-- normal swallow assessment 6/11- palliative care consulted; itching? From zosyn-- changed to ceftriaxone 6/13- pt discharged not on home o2- sent with px for Ceftin 10 day course (completed 8 days) with no improvement in symptoms. 6/25- re-admit to hospital with hypoxic RF and SOB  Interim History / Subjective:  As above  Objective   Pulse (!) 102, temperature 99.4 F (37.4 C), temperature source Oral, resp. rate (!) 24, height 5\' 3"  (1.6 m), weight 42.6 kg, SpO2 99 %.       No intake or output data in the 24 hours ending 09/16/22 1534 Filed Weights   09/16/22 1305  Weight: 42.6 kg    Examination:   Physical Exam Constitutional:      General: She is in acute distress.     Appearance: She is ill-appearing.  HENT:     Head: Normocephalic.     Mouth/Throat:     Mouth: Mucous membranes are moist.  Eyes:     Pupils: Pupils are equal, round, and reactive to light.  Cardiovascular:     Rate and Rhythm: Normal rate and regular rhythm.  Pulmonary:     Effort: Tachypnea, accessory muscle  usage and respiratory distress present.     Breath sounds: Decreased breath sounds present.     Comments: On 6L [place] Musculoskeletal:        General: Normal range of motion.     Cervical back: Normal range of motion.  Skin:    General: Skin is warm.     Capillary Refill: Capillary refill takes less than 2 seconds.  Neurological:     General: No focal deficit present.     Mental Status: She is alert and oriented to person, place, and time.  Psychiatric:        Behavior: Behavior is agitated.     Resolved Hospital Problem list   N/A  Assessment & Plan:  Acute Hypoxic Respiratory Failure Recent Bilateral lower love CAP requiring hospitalization 6/9-6/13/2024 (with streptococcus pneumonia and rhinovirus) Leukocytosis secondary to above Acute exacerbation of bronchiectasis and asthma Immunodeficiency- IVIG therapy stopped 06/15/2022 MS Bilateral vocal cord immobility with small trans-glotted airway Please see notes in EMR and care everywhere for full details. On my interview and examination pt oxygen saturation 96-100$% on 6L [place] however has accessory muscle use and diminished lung sounds with nonproductive cough. Pt states she has not previously required home oxygen however would like to be re-tested for oxygen need at discharge. Recently finished 8 days of Ceftin ABX therapy.  Repeat CXR shows: IMPRESSION: 1. Bilateral lower lobe hazy lung opacities, right worse than the left with right basilar atelectasis, suggesting ongoing infectious/inflammatory process. 2. Small bilateral pleural effusion.  -remains afebrile, leukocytosis markedly elevated at 40, however was 35 on previous admission with same s/s -Blood and urine cultures pending -Repeat sputum culture if pt able to produce sputum -Procal pending -IV ABX therapy with Cefepime and Vancomycin, pharmacy to manage Vanc -Chest percussion therapy -Solumedrol steroid therapy 40mg  IV daily -Mucinex BID as mucolytic -Albuterol  prn -Titrate oxygen as needed to maintain SPO2 saturation > 92% at all times -Will more than likely require supplemental oxygen at discharge -Will need to arrange for chest percussion vest as an outpatient  Unfortunately this is a patient with multiple co-morbidities and chronic illness including immunodeficiency no longer on IVIG, presenting as a re-admission for acute hypoxic RF as listed above. Overall prognosis is poor.    Hyponatremia Dehydration Pt states that she has had limited oral intake for the last several weeks. Serum sodium notably is 129, was 135 at time of discharge 6/13. Likely secondary to dehydration. -IVF given in the ED and we will continue to monitor serum sodium with AM labs    Best Practice (right click and "Reselect all SmartList Selections" daily)   Diet/type: Regular consistency (see orders) DVT prophylaxis: other GI prophylaxis: N/A Lines: N/A Foley:  N/A Code Status:  full code Last date of multidisciplinary goals of care discussion [09/16/22 ]  Labs   CBC: Recent Labs  Lab 09/16/22 1307  WBC 40.0*  NEUTROABS 36.8*  HGB 10.4*  HCT 32.6*  MCV 90.1  PLT 815*    Basic Metabolic Panel: Recent Labs  Lab 09/16/22 1307  NA 129*  K 3.9  CL 92*  CO2 24  GLUCOSE 127*  BUN  12  CREATININE 0.74  CALCIUM 9.5   GFR: Estimated Creatinine Clearance: 44 mL/min (by C-G formula based on SCr of 0.74 mg/dL). Recent Labs  Lab 09/16/22 1307 09/16/22 1312  WBC 40.0*  --   LATICACIDVEN  --  1.4    Liver Function Tests: Recent Labs  Lab 09/16/22 1307  AST 19  ALT 17  ALKPHOS 109  BILITOT 0.5  PROT 6.8  ALBUMIN 2.8*   No results for input(s): "LIPASE", "AMYLASE" in the last 168 hours. No results for input(s): "AMMONIA" in the last 168 hours.  ABG    Component Value Date/Time   PHART 7.450 09/29/2020 0445   PCO2ART 35.4 09/29/2020 0445   PO2ART 77.6 (L) 09/29/2020 0445   HCO3 27.9 09/16/2022 1312   TCO2 26 01/07/2017 1617    ACIDBASEDEF 5.3 (H) 02/24/2013 1625   O2SAT 70.8 09/16/2022 1312     Coagulation Profile: No results for input(s): "INR", "PROTIME" in the last 168 hours.  Cardiac Enzymes: No results for input(s): "CKTOTAL", "CKMB", "CKMBINDEX", "TROPONINI" in the last 168 hours.  HbA1C: Hgb A1c MFr Bld  Date/Time Value Ref Range Status  02/25/2013 08:35 AM 5.6 <5.7 % Final    Comment:    (NOTE)                                                                       According to the ADA Clinical Practice Recommendations for 2011, when HbA1c is used as a screening test:  >=6.5%   Diagnostic of Diabetes Mellitus           (if abnormal result is confirmed) 5.7-6.4%   Increased risk of developing Diabetes Mellitus References:Diagnosis and Classification of Diabetes Mellitus,Diabetes Care,2011,34(Suppl 1):S62-S69 and Standards of Medical Care in         Diabetes - 2011,Diabetes Care,2011,34 (Suppl 1):S11-S61.    CBG: No results for input(s): "GLUCAP" in the last 168 hours.  Review of Systems:    Please see the history of present illness. All other systems reviewed and are negative    Past Medical History:  She,  has a past medical history of Arthritis, Asthma, Bilateral vocal cord paralysis, Bronchiectasis (HCC), Cervical cancer (HCC) (2005), Depression, GERD (gastroesophageal reflux disease), Hiatal hernia, Hypothyroidism, Immunodeficiency (HCC), Insomnia, Multiple sclerosis (HCC) (1996), Osteoporosis, Pneumonia (2024, 2014, 1992), and Urine retention.   Surgical History:   Past Surgical History:  Procedure Laterality Date   ABDOMINAL HYSTERECTOMY  03/25/2003   for cervical cancer, complete   BRONCHIAL BIOPSY  10/26/2020   Procedure: BRONCHIAL BIOPSIES;  Surgeon: Josephine Igo, DO;  Location: [facility];  Service: Pulmonary;;   BRONCHIAL BRUSHINGS  10/26/2020   Procedure: BRONCHIAL BRUSHINGS;  Surgeon: Josephine Igo, DO;  Location: [facility];  Service: Pulmonary;;   BRONCHIAL  WASHINGS  10/26/2020   Procedure: BRONCHIAL WASHINGS;  Surgeon: Josephine Igo, DO;  Location: [facility];  Service: Pulmonary;;   BRONCHIAL WASHINGS  08/19/2022   Procedure: BRONCHIAL WASHINGS;  Surgeon: Josephine Igo, DO;  Location: [facility];  Service: Cardiopulmonary;;   CATARACT EXTRACTION, BILATERAL Bilateral    lens for cataracts   CONVERSION TO TOTAL HIP Right 09/18/2015   Procedure: CONVERSION OF RIGHT HEMI ARTHROPLASTY TO TOTAL HIP ARTHROPLASTY ACETABULAR REVISON ;  Surgeon: Durene Romans, MD;  Location: [facility];  Service: Orthopedics;  Laterality: Right;   HIP ARTHROPLASTY Right 03/01/2013   Procedure: ARTHROPLASTY BIPOLAR HIP;  Surgeon: Shelda Pal, MD;  Location: [facility];  Service: Orthopedics;  Laterality: Right;   HIP CLOSED REDUCTION Right 03/12/2013   Procedure: CLOSED REDUCTION HIP;  Surgeon: [name], MD;  Location: [facility];  Service: Orthopedics;  Laterality: Right;   HIP CLOSED REDUCTION Right 03/19/2013   Procedure: CLOSED REDUCTION HIP;  Surgeon: Javier Docker, MD;  Location: [facility];  Service: Orthopedics;  Laterality: Right;   HIP CLOSED REDUCTION Right 11/10/2015   Procedure: CLOSED MANIPULATION HIP;  Surgeon: Samson Frederic, MD;  Location: [facility];  Service: Orthopedics;  Laterality: Right;   HIP CLOSED REDUCTION Right 06/05/2017   Procedure: CLOSED REDUCTION HIP;  Surgeon: Ranee Gosselin, MD;  Location: [facility];  Service: Orthopedics;  Laterality: Right;   SMALL INTESTINE SURGERY     TOTAL HIP REVISION Right 06/06/2017   Procedure: TOTAL HIP REVISION;  Surgeon: Ranee Gosselin, MD;  Location: [facility];  Service: Orthopedics;  Laterality: Right;   VESICOVAGINAL FISTULA CLOSURE W/ TAH  03/25/2003   VIDEO BRONCHOSCOPY N/A 10/26/2020   Procedure: VIDEO BRONCHOSCOPY WITH FLUORO;  Surgeon: Josephine Igo, DO;  Location: [facility];  Service: Pulmonary;  Laterality: N/A;   VIDEO BRONCHOSCOPY Left 08/19/2022   Procedure: VIDEO BRONCHOSCOPY WITH FLUORO; WITH  BAL;  Surgeon: Josephine Igo, DO;  Location: [facility];  Service: Cardiopulmonary;  Laterality: Left;     Social History:   reports that she has never smoked. She has never used smokeless tobacco. She reports that she does not currently use alcohol after a past usage of about 2.0 standard drinks of alcohol per week. She reports that she does not use drugs.   Family History:  Her family history includes Atrial fibrillation in her brother; Colon cancer in her father; Heart attack in her maternal grandfather; Other in her mother; Parkinson's disease in her paternal grandfather. There is no history of Esophageal cancer, Stomach cancer, or Rectal cancer.   Allergies Allergies  Allergen Reactions   Tramadol     hallucinations    Contrast Media [Iodinated Contrast Media] Rash   Iohexol Hives   Morphine And Codeine Other (See Comments)    Reaction:  Hallucinations    Methylprednisolone Other (See Comments)    Reaction:  Decreases pts heart rate    Hydrocodone Itching   Oxycodone-Acetaminophen Rash and Other (See Comments)    Reaction:  Hallucinations    Phenytoin Sodium Extended Rash   Zanaflex [Tizanidine Hydrochloride] Rash     Home Medications  Prior to Admission medications   Medication Sig Start Date End Date Taking? Authorizing Provider  albuterol (VENTOLIN HFA) 108 (90 Base) MCG/ACT inhaler INHALE 2 PUFFS INTO LUNGS EVERY 6 HOURS AS NEEDED. Patient taking differently: Inhale 1 puff into the lungs every 6 (six) hours as needed for shortness of breath. 08/29/22   Kalman Shan, MD  calcium carbonate (OS-CAL - DOSED IN MG OF ELEMENTAL CALCIUM) 1250 (500 Ca) MG tablet Take 1 tablet (1,250 mg total) by mouth daily with breakfast. 06/22/22   Arnetha Courser, MD  Coenzyme Q10 (VITALINE COQ10) 300 MG WAFR Take 300 mg by mouth in the morning.    [provider]  dexlansoprazole (DEXILANT) 60 MG capsule TAKE 1 CAPSULE BY MOUTH  ONCE DAILY WITH BREAKFAST 05/12/22   [name],  MD  docusate sodium (COLACE) 100 MG capsule  Take 1 capsule (100 mg total) by mouth in the morning and at bedtime. 09/04/22   Glade Lloyd, MD  dronabinol (MARINOL) 2.5 MG capsule Take 1 capsule (2.5 mg total) by mouth 2 (two) times daily before lunch and supper. 07/08/22   [name], MD  famotidine (PEPCID) 20 MG tablet One after supper Patient taking differently: Take 20 mg by mouth at bedtime. One after supper 06/07/20   [name], MD  Ferric Maltol (ACCRUFER) 30 MG CAPS Take 1 capsule (30 mg total) by mouth in the morning and at bedtime. 09/09/22   [name], MD  FLUoxetine (PROZAC) 40 MG capsule Take 1 capsule (40 mg total) by mouth daily. 04/09/22   [name], MD  Fluticasone-Umeclidin-Vilant (TRELEGY ELLIPTA) 100-62.5-25 MCG/ACT AEPB Inhale 1 puff into the lungs daily. 05/26/22   [name], MD  guaiFENesin (MUCINEX) 600 MG 12 hr tablet Take 1 tablet (600 mg total) by mouth 2 (two) times daily. 09/04/22   Glade Lloyd, MD  levothyroxine (SYNTHROID) 100 MCG tablet TAKE 1 TABLET BY MOUTH ONCE  DAILY BEFORE BREAKFAST Patient taking differently: Take 100 mcg by mouth daily before breakfast. 12/31/21   [name], MD  methenamine (HIPREX) 1 g tablet Take 1 tablet (1 g total) by mouth 2 (two) times daily with a meal. 08/06/22   [name], MD  mirabegron ER (MYRBETRIQ) 25 MG TB24 tablet Take 1 tablet (25 mg total) by mouth daily. 09/04/22   Glade Lloyd, MD  mirtazapine (REMERON) 15 MG tablet Take 1 tablet (15 mg total) by mouth at bedtime. 09/09/22   [name], MD  Multiple Vitamin (MULTIVITAMIN WITH MINERALS) TABS tablet Take 1 tablet by mouth in the morning.    [provider]  Probiotic Product (PROBIOTIC PO) Take 1 capsule by mouth daily.    [provider]  Respiratory Therapy Supplies (FLUTTER) DEVI Use as directed 06/02/17   [name], MD  sodium chloride HYPERTONIC 3 % nebulizer solution Take by nebulization 2 (two) times daily  as needed for cough. Dx J44.9 08/29/22   Kalman Shan, MD  tamsulosin (FLOMAX) 0.4 MG CAPS capsule Take 0.4 mg by mouth daily.    [provider]  temazepam (RESTORIL) 15 MG capsule Take 1 capsule (15 mg total) by mouth at bedtime. 07/11/22   [name], MD  torsemide (DEMADEX) 10 MG tablet Take 1 tablet (10 mg total) by mouth daily. 07/08/22   [name], MD  trimethoprim (TRIMPEX) 100 MG tablet Take 100 mg by mouth daily. 11/02/21   [provider]     Critical care time: 65 minutes     Janeece Riggers, AGACNP-BC [facility] For pager details, please see AMION or use EPIC chat After 1900, please call [name] for cross coverage needs 09/16/2022 3:34 PM

## 2022-09-16 NOTE — Progress Notes (Signed)
[facility] is following sepsis bundle. 

## 2022-09-16 NOTE — ED Provider Notes (Signed)
[place] EMERGENCY DEPARTMENT AT [facility] Provider Note   CSN: [id number] Arrival date & time: 09/16/22  1246     History  Chief Complaint  Patient presents with   Shortness of Breath    Kayla [name] is a 72 y.o. female.  HPI 72 year old female history of MS, hiatal hernia, vocal cord paralysis, immunodeficiency, hypothyroidism, GERD, cervical cancer, depression insomnia, presents today complaining of increased dyspnea.  Patient was recently admitted for pneumonia.  Patient with recent respiratory failure secondary to bilateral pneumonia with strep pneumo bacteremia.  She was discharged to home on Ceftin.  She went into see her pulmonology team today.  She was noted to have sats of 84%.  EMS was called and placed her on bronchodilator.  She reported to them that she could not take Solu-Medrol.  She was transported here to the ED.      Home Medications Prior to Admission medications   Medication Sig Start Date End Date Taking? Authorizing Provider  albuterol (VENTOLIN HFA) 108 (90 Base) MCG/ACT inhaler INHALE 2 PUFFS INTO LUNGS EVERY 6 HOURS AS NEEDED. Patient taking differently: Inhale 1 puff into the lungs every 6 (six) hours as needed for shortness of breath. 08/29/22   Kalman Shan, MD  calcium carbonate (OS-CAL - DOSED IN MG OF ELEMENTAL CALCIUM) 1250 (500 Ca) MG tablet Take 1 tablet (1,250 mg total) by mouth daily with breakfast. 06/22/22   Arnetha Courser, MD  Coenzyme Q10 (VITALINE COQ10) 300 MG WAFR Take 300 mg by mouth in the morning.    [provider]  dexlansoprazole (DEXILANT) 60 MG capsule TAKE 1 CAPSULE BY MOUTH  ONCE DAILY WITH BREAKFAST 05/12/22   [name], MD  docusate sodium (COLACE) 100 MG capsule Take 1 capsule (100 mg total) by mouth in the morning and at bedtime. 09/04/22   Glade Lloyd, MD  dronabinol (MARINOL) 2.5 MG capsule Take 1 capsule (2.5 mg total) by mouth 2 (two) times daily before lunch and supper. 07/08/22   [name], MD  famotidine (PEPCID) 20 MG tablet One after supper Patient taking differently: Take 20 mg by mouth at bedtime. One after supper 06/07/20   [name], MD  Ferric Maltol (ACCRUFER) 30 MG CAPS Take 1 capsule (30 mg total) by mouth in the morning and at bedtime. 09/09/22   [name], MD  FLUoxetine (PROZAC) 40 MG capsule Take 1 capsule (40 mg total) by mouth daily. 04/09/22   [name], MD  Fluticasone-Umeclidin-Vilant (TRELEGY ELLIPTA) 100-62.5-25 MCG/ACT AEPB Inhale 1 puff into the lungs daily. 05/26/22   [name], MD  guaiFENesin (MUCINEX) 600 MG 12 hr tablet Take 1 tablet (600 mg total) by mouth 2 (two) times daily. 09/04/22   Glade Lloyd, MD  levothyroxine (SYNTHROID) 100 MCG tablet TAKE 1 TABLET BY MOUTH ONCE  DAILY BEFORE BREAKFAST Patient taking differently: Take 100 mcg by mouth daily before breakfast. 12/31/21   [name], MD  methenamine (HIPREX) 1 g tablet Take 1 tablet (1 g total) by mouth 2 (two) times daily with a meal. 08/06/22   [name], MD  mirabegron ER (MYRBETRIQ) 25 MG TB24 tablet Take 1 tablet (25 mg total) by mouth daily. 09/04/22   Glade Lloyd, MD  mirtazapine (REMERON) 15 MG tablet Take 1 tablet (15 mg total) by mouth at bedtime. 09/09/22   [name], MD  Multiple Vitamin (MULTIVITAMIN WITH MINERALS) TABS tablet Take 1 tablet by mouth in the morning.  [provider]  Probiotic Product (PROBIOTIC PO) Take 1 capsule by mouth daily.    [provider]  Respiratory Therapy Supplies (FLUTTER) DEVI Use as directed 06/02/17   [name], MD  sodium chloride HYPERTONIC 3 % nebulizer solution Take by nebulization 2 (two) times daily as needed for cough. Dx J44.9 08/29/22   Kalman Shan, MD  tamsulosin (FLOMAX) 0.4 MG CAPS capsule Take 0.4 mg by mouth daily.    [provider]  temazepam (RESTORIL) 15 MG capsule Take 1 capsule (15 mg total) by mouth at bedtime. 07/11/22   [name], MD   torsemide (DEMADEX) 10 MG tablet Take 1 tablet (10 mg total) by mouth daily. 07/08/22   [name], MD  trimethoprim (TRIMPEX) 100 MG tablet Take 100 mg by mouth daily. 11/02/21   [provider]      Allergies    Tramadol, Contrast media [iodinated contrast media], Iohexol, Morphine and codeine, Methylprednisolone, Hydrocodone, Oxycodone-acetaminophen, Phenytoin sodium extended, and Zanaflex [tizanidine hydrochloride]    Review of Systems   Review of Systems  Physical Exam Updated Vital Signs Pulse (!) 102   Temp 99.4 F (37.4 C) (Oral)   Resp (!) 24   Ht 1.6 m (5\' 3" )   Wt 42.6 kg   SpO2 99%   BMI 16.64 kg/m  Physical Exam Vitals reviewed.  Constitutional:      General: She is in acute distress.     Appearance: She is ill-appearing.  HENT:     Head: Normocephalic.     Mouth/Throat:     Comments: Mucous membranes are dry Eyes:     Pupils: Pupils are equal, round, and reactive to light.  Pulmonary:     Effort: Tachypnea present.     Comments: Decreased breath sounds throughout Neurological:     Mental Status: She is alert.     ED Results / Procedures / Treatments   Labs (all labs ordered are listed, but only abnormal results are displayed) Labs Reviewed  BLOOD GAS, VENOUS - Abnormal; Notable for the following components:      Result Value   pCO2, Ven 43 (*)    Acid-Base Excess 2.9 (*)    All other components within normal limits  CBC WITH DIFFERENTIAL/PLATELET - Abnormal; Notable for the following components:   WBC 40.0 (*)    RBC 3.62 (*)    Hemoglobin 10.4 (*)    HCT 32.6 (*)    Platelets 815 (*)    Neutro Abs 36.8 (*)    Basophils Absolute 0.2 (*)    Abs Immature Granulocytes 0.70 (*)    All other components within normal limits  COMPREHENSIVE METABOLIC PANEL - Abnormal; Notable for the following components:   Sodium 129 (*)    Chloride 92 (*)    Glucose, Bld 127 (*)    Albumin 2.8 (*)    All other components within normal limits   CULTURE, BLOOD (SINGLE)  LACTIC ACID, PLASMA    EKG None  Radiology DG Chest Port 1 View  Result Date: 09/16/2022 CLINICAL DATA:  Cough and dyspnea. EXAM: PORTABLE CHEST 1 VIEW COMPARISON:  Chest radiograph dated September 04, 2022 FINDINGS: The heart size and mediastinal contours are within normal limits. Bilateral lower lobe hazy lung opacities, right worse than the left with right basilar atelectasis, suggesting ongoing infectious/inflammatory process. Small bilateral pleural effusion. The visualized skeletal structures are unremarkable. IMPRESSION: 1. Bilateral lower lobe hazy lung opacities, right worse than the left with right basilar  atelectasis, suggesting ongoing infectious/inflammatory process. 2. Small bilateral pleural effusion. Electronically Signed   By: Larose Hires D.O.   On: 09/16/2022 14:35    Procedures .Critical Care  Performed by: Margarita Grizzle, MD Authorized by: Margarita Grizzle, MD   Critical care provider statement:    Critical care time (minutes):  60   Critical care was time spent personally by me on the following activities:  Development of treatment plan with patient or surrogate, discussions with consultants, evaluation of patient's response to treatment, examination of patient, ordering and review of laboratory studies, ordering and review of radiographic studies, ordering and performing treatments and interventions, pulse oximetry, re-evaluation of patient's condition and review of old charts     Medications Ordered in ED Medications  lactated ringers infusion ( Intravenous New Bag/Given 09/16/22 1326)  vancomycin (VANCOREADY) IVPB 750 mg/150 mL (has no administration in time range)  ceFEPIme (MAXIPIME) 2 g in sodium chloride 0.9 % 100 mL IVPB (2 g Intravenous New Bag/Given 09/16/22 1519)  sodium chloride 0.9 % bolus 500 mL (500 mLs Intravenous New Bag/Given 09/16/22 1326)  albuterol (PROVENTIL) (2.5 MG/3ML) 0.083% nebulizer solution 5 mg (5 mg Nebulization Given  09/16/22 1324)  methylPREDNISolone sodium succinate (SOLU-MEDROL) 125 mg/2 mL injection 125 mg (125 mg Intravenous Given 09/16/22 1518)  lactated ringers bolus 1,000 mL (1,000 mLs Intravenous New Bag/Given 09/16/22 1518)    ED Course/ Medical Decision Making/ A&P Clinical Course as of 09/16/22 1523  Tue Sep 16, 2022  1448 CBC is reviewed and interpreted and significant for leukocytosis with white count of 40,000 and elevated platelets at 815,000 VBG reviewed interpreted significant for pH of 7.42 pCO2 of 43 and pO2 of 42 Complete metabolic panel is reviewed interpreted sooner for hyponatremia with sodium 129 and glucose slightly elevated at 127 Lactic acid reviewed interpreted normal Chest x-Czar Ysaguirre is reviewed and interpreted and continues to have multilobar increased markings consistent with pneumonia radiologist interpretation notes bilateral lower lobe hazy lung opacities right worse than left small bilateral pleural effusion [DR]    Clinical Course User Index [DR] Margarita Grizzle, MD                             Medical Decision Making Amount and/or Complexity of Data Reviewed Labs: ordered. Radiology: ordered.  Risk Prescription drug management.   72 year old female history of recent admission for respiratory failure and pneumonia presents today with increased dyspnea and decreased oxygen saturations.  Patient was seen in pulmonary office and referred to ED. Patient is tachypneic and tachycardic here.  Blood pressure has remained stable. Differential diagnosis includes but is not limited to sepsis, respiratory failure, worsening pneumonia, electrolyte abnormalities and dehydration On my interpretation, appears slightly worse from prior It is oxygen here on 6 L of oxygen at 99% She has received IV fluids and Maxipime Prednisone VBG reviewed and no evidence of hypercarbia she remains hypoxic Significant leukocytosis and thrombocytosis Suspect some of this is volume depletion and  patient is receiving IV fluids Initial lactic is negative Critical care consulted and they will see in consultation and feel patient can be admitted to stepdown unit to hospitalist Care discussed with Dr. Erenest Blank who will see patient for admission Patient reevaluated blood pressure currently 104/60 with heart rate of 93 oxygen sats 98% on 6 L and patient is much more comfortable appearing at this time.  New critical care is at bedside and evaluating       Final Clinical  Impression(s) / ED Diagnoses Final diagnoses:  HCAP (healthcare-associated pneumonia)  Acute on chronic respiratory failure with hypoxia [facility])    Rx / DC Orders ED Discharge Orders     None         Margarita Grizzle, MD 09/16/22 1523

## 2022-09-16 NOTE — Progress Notes (Signed)
A consult was received from an ED physician for Vancomycin per pharmacy dosing.  [name] profile has been reviewed for ht/wt/allergies/indication/available labs.    A one time order has been placed for Vancomycin 750 mg IV.    Further antibiotics/pharmacy consults should be ordered by admitting physician if indicated.                       Thank you, Lynden Ang, PharmD, BCPS 09/16/2022  2:47 PM

## 2022-09-16 NOTE — Progress Notes (Signed)
History of Present Illness Kayla [name] is a 72 y.o. female never smoker with Multiple sclerosis, Hiatal hernia, Bilateral vocal cord immobility, Pneumonia, Immunodeficiency on IVIG, Hypothyroidism, Urine retention, GERD, Cervical cancer, Depression, Insomnia . She is followed by Dr. Marchelle [name]   Synopsis 72 yo female followed by Dr. Marchelle [name] in the pulmonary clinic for bronchiectasis and asthma with chronic cough was found to have worsening Lt lower lobe infiltrate at visit on 08/12/22.  She had bronchoscopy with bilateral washings of the mainstem bronchi, BAL of Lt and Rt lower lobes.  BAL was positive for pneumococcus and treated with augmentin.  She has a history of immunodeficiency on IVIG, but this was stopped early in 2024.  She presented to [facility] ER on 6/09 with progressive dyspnea and wheezing.  She was admitted for aspiration pneumonia, specifically RLL with multifocal mucus plugging.Marland Kitchen  Respiratory viral panel positive for rhinovirus. Cultures were positive for  PCCM consulted to assist with respiratory management.She was treated with antibiotics,steroids, mucolytic's and chest physiotherapy. She was discharged 09/04/2022. She followed up with her PCP last week, and stopped her  Ceftin which was prescribed at discharge 8 days into the 10 day coarse. She has Immunodeficiency on IVIG , she was receiving IVIG 11/2020, and stopped 05/2022. Pt. Stopped her infusions for an unknown reason at that time. Since then she has had recurrent pneumonias. She also had Pseudomonas in urine culture this admission, which is thought to be  colonization  6/09 Admit 6/10 speech therapy >> normal swallow assessment 6/11 palliative care consulted; itching ?from zosyn >> change to ceftriaxone     09/16/2022 Pt. Presents for hospital follow up. Upon arrival she was in respiratory distress.  Sats were  81% on RA .She was  audibly wheezing and coughing. She was gasping for air. She was using accessory muscles, nasal  flaring was present, she had sternal retractions. She was not discharged with oxygen, and her sats have been very low( in the 70's)  for 24-48 hours per her husband. They have not been using albuterol nebs.  She was placed on oxygen 4L, sats initially responded and then dropped again. She is now on 6 L with sats of 96%. She was given a xopenex neb ( .63 mg/ 3cc's) She has a cough which is productive with brown secretions . She has been having chills for 7-8 days.  They have not checked her temperature. She did see her PCP last Tuesday and they had a discussion about Palliative care. I am concerned she has recurrent pneumonia. Breath sounds are diminished per right base and she is in bronchospasm.  She has been doing poorly over the last week. . She had run out of her albuterol, she did have a refill, but they were not aware. They have been using 3% saline. Pt. Husband endorses that they should have gone to the ED sooner, but they were hoping to get in to see immunology this week to resume her IVIG treatments.  EMS was called and patient has been transported to [facility] on oxygen with paramedic. She will need oxygen at discharge and she will also need a chest vest for mobilization of secretions/ bronchiectasis. She need immunology to resume her IVIG infusions.   Test Results: Ig levels 12/22/19 >> IgA 51, IgG 289, IgM 27 (all low) PFT 10/24/21 >> FEV1 1.59 (72%), FEV1% 71, TLC 4.92 (100%), DLCO 60%, +BD CT chest 08/11/22 >> apical scarring, cylindrical BTX more in lower lobes, mucoid impaction and peribronchovascular  nodularity in lower lobes Lt > Rt CT angio chest 08/31/22 >> debris in RLL bronchus, b/l lower lobe bronchial wall thickening with multifocal mucus plugging, scattered areas of GGO b/l, RLL area of consolidation     Latest Ref Rng & Units 09/04/2022    3:35 AM 09/03/2022    4:07 AM 09/01/2022    4:06 AM  CBC  WBC 4.0 - 10.5 K/uL 9.7  12.7  24.7   Hemoglobin 12.0 - 15.0 g/dL 45.4   9.5  09.8   Hematocrit 36.0 - 46.0 % 31.8  30.7  32.7   Platelets 150 - 400 K/uL 621  615  657        Latest Ref Rng & Units 09/04/2022    3:35 AM 09/01/2022    4:06 AM 08/31/2022    4:14 PM  BMP  Glucose 70 - 99 mg/dL 98  119  92   BUN 8 - 23 mg/dL 7  16  14    Creatinine 0.44 - 1.00 mg/dL 1.47  8.29  5.62   Sodium 135 - 145 mmol/L 135  133  132   Potassium 3.5 - 5.1 mmol/L 3.8  4.7  4.3   Chloride 98 - 111 mmol/L 99  98  98   CO2 22 - 32 mmol/L 28  24  22    Calcium 8.9 - 10.3 mg/dL 8.8  9.1  9.2     BNP    Component Value Date/Time   BNP 106.3 (H) 08/31/2022 1703    ProBNP    Component Value Date/Time   PROBNP 112.0 (H) 01/14/2022 0824    PFT    Component Value Date/Time   FEV1PRE 1.09 10/24/2021 1548   FEV1POST 1.59 10/24/2021 1548   FVCPRE 2.17 10/24/2021 1548   FVCPOST 2.22 10/24/2021 1548   TLC 4.92 10/24/2021 1548   DLCOUNC 11.43 10/24/2021 1548   PREFEV1FVCRT 50 10/24/2021 1548   PSTFEV1FVCRT 71 10/24/2021 1548    DG Swallowing Func-Speech Pathology  Result Date: 09/04/2022 Table formatting from the original result was not included. Modified Barium Swallow Study Patient Details Name: Kayla [name] MRN: [id number] Date of Birth: Jun 08, 1950 Today's Date: 09/04/2022 HPI/PMH: HPI: Patient is a 72 y.o. female with PMH: Multiple Sclerosis, recurrent PNA, COPD/asthma, GERD, hiatal hernia, hypothyroidism, bilateral vocal cord paralysis, depression, cervical cancer and h/o UTI. MBS completed in July of 2022 did not show any aspiration of liquids or solids but did show pharyngeal retention of solids which decreased with subsequent swallows. She presented to the hospital on 08/31/22 with worsening SOB. She had brochoscopy done by pulmonology recently and was started on Augmentin due to cultures growing strep PNA. She has been off of her IVIG for approximately 3 months. CT chest from 08/31/22 showed debris in RLL bronchus, b/l lower lobe bronchial wall thickening with multifocal  mucus plugging, scattered areas of GGO b/l, RLL area of consolidation. MD started h her on PO diet of Dys 3 (mechanical soft) solids and nectar thick liquids.  Pt reports being very fatigued. Limited barium provided due to pt's potential ileus upon admit.  Clinical Impression: Clinical Impression: Patient presents with mild pharyngeal dypshagia mostly characterized by impaired hyo-laryngeal elevation, laryngeal closure, epiglottic deflection as epiglottis contacts posterior pharyngeal wall with swallow.  Pt anterior curvature of spine appears to contribute to impaired epiglottic deflection. An episode of retrograde propulsion of barium proximal in pharynx observed (not to oropharyngeal space).  Laryngeal penetration of thin and nectar liquids noted as well as pyriform sinus retention  which pt clears with breath=hold sustained 2nd swallow.  Various postures including head turn right with and without head of chair reclined with head tilt were not effective to decrease retention.  Prominent PES noted as well.  Swallow function appears largely consisent with findings from most recent MBS in 2022.  Will provide pt with exercises to mitigate her dypshagia.  No aspiration observed and cough observed frequently. Factors that may increase risk of adverse event in presence of aspiration Rubye Oaks & Clearance Coots 2021): Factors that may increase risk of adverse event in presence of aspiration Rubye Oaks & Clearance Coots 2021): Respiratory or GI disease; Poor general health and/or compromised immunity; Reduced saliva Recommendations/Plan: Swallowing Evaluation Recommendations Swallowing Evaluation Recommendations Recommendations: PO diet PO Diet Recommendation: Regular; Thin liquids (Level 0) Liquid Administration via: Cup; Straw Medication Administration: Other (Comment) (as tolerated) Supervision: Patient able to self-feed Swallowing strategies  : Slow rate; Small bites/sips; effortful swallow; Multiple dry swallows after each bite/sip  Postural changes: Position pt fully upright for meals; Stay upright 30-60 min after meals Oral care recommendations: Oral care BID (2x/day) Treatment Plan Treatment Plan Treatment recommendations: Therapy as outlined in treatment plan below Follow-up recommendations: Follow physicians's recommendations for discharge plan and follow up therapies Functional status assessment: Patient has not had a recent decline in their functional status. Treatment frequency: Min 1x/week Treatment duration: 1 week Interventions: Aspiration precaution training; Oropharyngeal exercises; Compensatory techniques; Patient/family education Recommendations Recommendations for follow up therapy are one component of a multi-disciplinary discharge planning process, led by the attending physician.  Recommendations may be updated based on patient status, additional functional criteria and insurance authorization. Assessment: Orofacial Exam: Orofacial Exam Oral Cavity: Oral Hygiene: WFL Oral Cavity - Dentition: Adequate natural dentition Orofacial Anatomy: WFL Oral Motor/Sensory Function: WFL Anatomy: Anatomy: Other (Comment); Suspected cervical osteophytes; Prominent cricopharyngeus Boluses Administered: Boluses Administered Boluses Administered: Thin liquids (Level 0); Mildly thick liquids (Level 2, nectar thick); Solid  Oral Impairment Domain: Oral Impairment Domain Lip Closure: No labial escape Tongue control during bolus hold: Cohesive bolus between tongue to palatal seal Bolus preparation/mastication: Timely and efficient chewing and mashing Bolus transport/lingual motion: Brisk tongue motion Oral residue: Complete oral clearance Location of oral residue : Floor of mouth Initiation of pharyngeal swallow : Pyriform sinuses  Pharyngeal Impairment Domain: Pharyngeal Impairment Domain Soft palate elevation: No bolus between soft palate (SP)/pharyngeal wall (PW) Laryngeal elevation: Partial superior movement of thyroid cartilage/partial  approximation of arytenoids to epiglottic petiole Anterior hyoid excursion: Partial anterior movement Epiglottic movement: Partial inversion Laryngeal vestibule closure: Incomplete, narrow column air/contrast in laryngeal vestibule Pharyngeal stripping wave : Present - diminished Pharyngoesophageal segment opening: Partial distention/partial duration, partial obstruction of flow Tongue base retraction: Trace column of contrast or air between tongue base and PPW Pharyngeal residue: Collection of residue within or on pharyngeal structures Location of pharyngeal residue: Valleculae; Pharyngeal wall; Pyriform sinuses; Tongue base  Esophageal Impairment Domain: Esophageal Impairment Domain Esophageal clearance upright position: Esophageal retention Pill: Esophageal Impairment Domain Esophageal clearance upright position: Esophageal retention Penetration/Aspiration Scale Score: Penetration/Aspiration Scale Score 1.  Material does not enter airway: Solid; Mildly thick liquids (Level 2, nectar thick) 2.  Material enters airway, remains ABOVE vocal cords then ejected out: Thin liquids (Level 0) Compensatory Strategies: Compensatory Strategies Compensatory strategies: Yes Effortful swallow: Effective Effective Effortful Swallow: Solid Right head turn: Ineffective Effective Right Head Turn: Thin liquid (Level 0); Mildly thick liquid (Level 2, nectar thick) Ineffective Right Head Turn: Thin liquid (Level 0); Mildly thick liquid (Level 2, nectar thick) Reclining  posture: Ineffective Effective Reclining Posture: Thin liquid (Level 0); Mildly thick liquid (Level 2, nectar thick) Ineffective Reclining Posture: Thin liquid (Level 0); Mildly thick liquid (Level 2, nectar thick) Posterior head tilt: Ineffective Ineffective Posterior head tilt: Thin liquid (Level 0); Mildly thick liquid (Level 2, nectar thick)   General Information: Caregiver present: No  Diet Prior to this Study: Dysphagia 3 (mechanical soft); Mildly thick liquids  (Level 2, nectar thick)   Temperature : Normal   Respiratory Status: WFL   Supplemental O2: None (Room air)   History of Recent Intubation: No  Behavior/Cognition: Alert; Cooperative; Pleasant mood Self-Feeding Abilities: Able to self-feed Baseline vocal quality/speech: Other (comment); Dysphonic Volitional Cough: Able to elicit Volitional Swallow: Able to elicit Exam Limitations: No limitations Goal Planning: Prognosis for improved oropharyngeal function: Fair Barriers to Reach Goals: Time post onset No data recorded Patient/Family Stated Goal: pt reports she is feeling better but complains of itching Consulted and agree with results and recommendations: Patient Pain: Pain Assessment Pain Assessment: No/denies pain Pain Score: 0 Faces Pain Scale: 4 Breathing: 0 Negative Vocalization: 0 Facial Expression: 0 Body Language: 0 Consolability: 0 PAINAD Score: 0 Pain Location: throat from coughing Pain Descriptors / Indicators: Grimacing; Discomfort Pain Intervention(s): Monitored during session End of Session: Start Time:SLP Start Time (ACUTE ONLY): 1040 Stop Time: SLP Stop Time (ACUTE ONLY): 1100 Time Calculation:SLP Time Calculation (min) (ACUTE ONLY): 20 min Charges: SLP Evaluations $ SLP Speech Visit: 1 Visit SLP Evaluations $BSS Swallow: 1 Procedure $Swallowing Treatment: 1 Procedure SLP visit diagnosis: SLP Visit Diagnosis: Dysphagia, pharyngeal phase (R13.13); Dysphagia, pharyngoesophageal phase (R13.14) Past Medical History: Past Medical History: Diagnosis Date  Arthritis   oa  Asthma   Bilateral vocal cord paralysis   Bronchiectasis (HCC)   Cervical cancer (HCC) 2005  Depression   GERD (gastroesophageal reflux disease)   Hiatal hernia   Hypothyroidism   Immunodeficiency (HCC)   Gets IVIG infusions  Insomnia   Multiple sclerosis (HCC) 1996  Osteoporosis   Pneumonia 2024, 2014, 1992  Urine retention  Past Surgical History: Past Surgical History: Procedure Laterality Date  ABDOMINAL HYSTERECTOMY  03/25/2003  for  cervical cancer, complete  BRONCHIAL BIOPSY  10/26/2020  Procedure: BRONCHIAL BIOPSIES;  Surgeon: Josephine Igo, DO;  Location: [facility];  Service: Pulmonary;;  BRONCHIAL BRUSHINGS  10/26/2020  Procedure: BRONCHIAL BRUSHINGS;  Surgeon: Josephine Igo, DO;  Location: [facility];  Service: Pulmonary;;  BRONCHIAL WASHINGS  10/26/2020  Procedure: BRONCHIAL WASHINGS;  Surgeon: Josephine Igo, DO;  Location: [facility];  Service: Pulmonary;;  BRONCHIAL WASHINGS  08/19/2022  Procedure: BRONCHIAL WASHINGS;  Surgeon: Josephine Igo, DO;  Location: [facility];  Service: Cardiopulmonary;;  CATARACT EXTRACTION, BILATERAL Bilateral   lens for cataracts  CONVERSION TO TOTAL HIP Right 09/18/2015  Procedure: CONVERSION OF RIGHT HEMI ARTHROPLASTY TO TOTAL HIP ARTHROPLASTY ACETABULAR REVISON ;  Surgeon: Durene Romans, MD;  Location: [facility];  Service: Orthopedics;  Laterality: Right;  HIP ARTHROPLASTY Right 03/01/2013  Procedure: ARTHROPLASTY BIPOLAR HIP;  Surgeon: Shelda Pal, MD;  Location: [facility];  Service: Orthopedics;  Laterality: Right;  HIP CLOSED REDUCTION Right 03/12/2013  Procedure: CLOSED REDUCTION HIP;  Surgeon: [name], MD;  Location: [facility];  Service: Orthopedics;  Laterality: Right;  HIP CLOSED REDUCTION Right 03/19/2013  Procedure: CLOSED REDUCTION HIP;  Surgeon: Javier Docker, MD;  Location: [facility];  Service: Orthopedics;  Laterality: Right;  HIP CLOSED REDUCTION Right 11/10/2015  Procedure: CLOSED MANIPULATION HIP;  Surgeon: Samson Frederic, MD;  Location: [facility];  Service: Orthopedics;  Laterality: Right;  HIP CLOSED REDUCTION Right 06/05/2017  Procedure: CLOSED REDUCTION HIP;  Surgeon: Ranee Gosselin, MD;  Location: [facility];  Service: Orthopedics;  Laterality: Right;  SMALL INTESTINE SURGERY    TOTAL HIP REVISION Right 06/06/2017  Procedure: TOTAL HIP REVISION;  Surgeon: Ranee Gosselin, MD;  Location: [facility];  Service: Orthopedics;  Laterality: Right;  VESICOVAGINAL FISTULA CLOSURE W/  TAH  03/25/2003  VIDEO BRONCHOSCOPY N/A 10/26/2020  Procedure: VIDEO BRONCHOSCOPY WITH FLUORO;  Surgeon: Josephine Igo, DO;  Location: [facility];  Service: Pulmonary;  Laterality: N/A;  VIDEO BRONCHOSCOPY Left 08/19/2022  Procedure: VIDEO BRONCHOSCOPY WITH FLUORO; WITH BAL;  Surgeon: Josephine Igo, DO;  Location: [facility];  Service: Cardiopulmonary;  Laterality: Left; Rolena Infante, MS [facility] SLP Acute Rehab Services Office [phone] Kayla [name] 09/04/2022, 10:11 AM  DG Chest Port 1 View  Result Date: 09/04/2022 CLINICAL DATA:  [id number] CAP (community acquired pneumonia) [phone] EXAM: PORTABLE CHEST - 1 VIEW COMPARISON:  08/31/2022 FINDINGS: The focal areas of airspace consolidation previously seen in the lung bases are slightly less conspicuous but there has been some increase in scattered interstitial opacities predominantly in the lung bases. Left infrahilar airspace opacities persist. No effusion. Heart size and mediastinal contours are within normal limits. No effusion. Old fracture deformity of the proximal right humerus. IMPRESSION: 1. Persistent left infrahilar airspace opacities. 2. Slightly increased interstitial opacities in the lung bases. Electronically Signed   By: Corlis Leak M.D.   On: 09/04/2022 08:14   CT Angio Chest PE W and/or Wo Contrast  Result Date: 08/31/2022 CLINICAL DATA:  Pulmonary embolism suspected, high probability. History of COPD, asthma, recurrent pneumonia. EXAM: CT ANGIOGRAPHY CHEST WITH CONTRAST TECHNIQUE: Multidetector CT imaging of the chest was performed using the standard protocol during bolus administration of intravenous contrast. Multiplanar CT image reconstructions and MIPs were obtained to evaluate the vascular anatomy. RADIATION DOSE REDUCTION: This exam was performed according to the departmental dose-optimization program which includes automated exposure control, adjustment of the mA and/or kV according to patient size and/or use of iterative  reconstruction technique. CONTRAST:  75mL OMNIPAQUE IOHEXOL 350 MG/ML SOLN COMPARISON:  08/07/2022. FINDINGS: Cardiovascular: The heart is normal in size and there is a trace pericardial effusion. There is atherosclerotic calcification of the aorta without evidence of aneurysm. Pulmonary trunk is normal in caliber. No evidence of pulmonary embolism. Evaluation is limited due to respiratory motion artifact. Mediastinum/Nodes: A prominent lymph node is present in the prevascular space measuring 9 mm. No hilar or axillary lymphadenopathy. The trachea and esophagus are within normal limits. Lungs/Pleura: Debris is present in the right lower lobe bronchus. There is bronchial wall thickening in the lower lobes bilaterally with multifocal mucous plugging. Scattered ground-glass opacities are present in the lungs bilaterally with a basilar predominance. Consolidation is noted in the right lower lobe. No effusion or pneumothorax. Upper Abdomen: No acute abnormality. Musculoskeletal: No acute osseous abnormality. Review of the MIP images confirms the above findings. IMPRESSION: 1. No evidence of pulmonary embolism. Evaluation is limited due to respiratory motion artifact. 2. Scattered ground-glass opacities are noted in the lungs bilaterally. Bronchial wall thickening in the lower lobes bilaterally with debris in the right lower lobe bronchus and multifocal mucous plugging and consolidation of the right lung base, concerning for pneumonia. Atypical pneumonia should be considered in the differential diagnosis. 3. Aortic atherosclerosis. Electronically Signed   By: Thornell Sartorius M.D.   On: 08/31/2022 22:18   DG  Abd 1 View  Result Date: 08/31/2022 CLINICAL DATA:  Constipation. EXAM: ABDOMEN - 1 VIEW COMPARISON:  Radiograph 06/27/2022, hip CT 06/19/2022 FINDINGS: Moderate stool in the right colon. No significant formed stool distally. Scattered air throughout small bowel in the central and left abdomen. Multiple surgical clips  in the pelvis. Fluffy calcifications projecting over the pelvis or in the soft tissues on prior hip CT. Right hip arthroplasty. IMPRESSION: 1. Moderate stool in the right colon. No significant formed stool distally. 2. Air scattered throughout small bowel that is nondilated, possible ileus. Electronically Signed   By: Narda Rutherford M.D.   On: 08/31/2022 19:49   DG Chest 2 View  Result Date: 08/31/2022 CLINICAL DATA:  Shortness of breath with worsening cough. EXAM: CHEST - 2 VIEW COMPARISON:  06/27/2022 FINDINGS: Patchy bibasilar airspace disease is suspicious for pneumonia. No substantial pleural effusion. No pulmonary edema. The cardiopericardial silhouette is within normal limits for size. No acute bony abnormality. Telemetry leads overlie the chest. IMPRESSION: Patchy bibasilar airspace disease suspicious for pneumonia. Electronically Signed   By: Kennith Center M.D.   On: 08/31/2022 16:01     Past medical hx Past Medical History:  Diagnosis Date   Arthritis    oa   Asthma    Bilateral vocal cord paralysis    Bronchiectasis (HCC)    Cervical cancer (HCC) 2005   Depression    GERD (gastroesophageal reflux disease)    Hiatal hernia    Hypothyroidism    Immunodeficiency (HCC)    Gets IVIG infusions   Insomnia    Multiple sclerosis (HCC) 1996   Osteoporosis    Pneumonia 2024, 2014, 1992   Urine retention      Social History   Tobacco Use   Smoking status: Never   Smokeless tobacco: Never  Vaping Use   Vaping Use: Never used  Substance Use Topics   Alcohol use: Not Currently    Alcohol/week: 2.0 standard drinks of alcohol    Types: 1 Glasses of wine, 1 Cans of beer per week    Comment: 2x/week    Drug use: No    Ms.Brimage reports that she has never smoked. She has never used smokeless tobacco. She reports that she does not currently use alcohol after a past usage of about 2.0 standard drinks of alcohol per week. She reports that she does not use drugs.  Tobacco  Cessation: Never smoker    Past surgical hx, Family hx, Social hx all reviewed.  Current Outpatient Medications on File Prior to Visit  Medication Sig   albuterol (VENTOLIN HFA) 108 (90 Base) MCG/ACT inhaler INHALE 2 PUFFS INTO LUNGS EVERY 6 HOURS AS NEEDED. (Patient taking differently: Inhale 1 puff into the lungs every 6 (six) hours as needed for shortness of breath.)   calcium carbonate (OS-CAL - DOSED IN MG OF ELEMENTAL CALCIUM) 1250 (500 Ca) MG tablet Take 1 tablet (1,250 mg total) by mouth daily with breakfast.   Coenzyme Q10 (VITALINE COQ10) 300 MG WAFR Take 300 mg by mouth in the morning.   dexlansoprazole (DEXILANT) 60 MG capsule TAKE 1 CAPSULE BY MOUTH  ONCE DAILY WITH BREAKFAST   docusate sodium (COLACE) 100 MG capsule Take 1 capsule (100 mg total) by mouth in the morning and at bedtime.   dronabinol (MARINOL) 2.5 MG capsule Take 1 capsule (2.5 mg total) by mouth 2 (two) times daily before lunch and supper.   famotidine (PEPCID) 20 MG tablet One after supper (Patient taking differently: Take 20 mg  by mouth at bedtime. One after supper)   Ferric Maltol (ACCRUFER) 30 MG CAPS Take 1 capsule (30 mg total) by mouth in the morning and at bedtime.   FLUoxetine (PROZAC) 40 MG capsule Take 1 capsule (40 mg total) by mouth daily.   Fluticasone-Umeclidin-Vilant (TRELEGY ELLIPTA) 100-62.5-25 MCG/ACT AEPB Inhale 1 puff into the lungs daily.   guaiFENesin (MUCINEX) 600 MG 12 hr tablet Take 1 tablet (600 mg total) by mouth 2 (two) times daily.   levothyroxine (SYNTHROID) 100 MCG tablet TAKE 1 TABLET BY MOUTH ONCE  DAILY BEFORE BREAKFAST (Patient taking differently: Take 100 mcg by mouth daily before breakfast.)   methenamine (HIPREX) 1 g tablet Take 1 tablet (1 g total) by mouth 2 (two) times daily with a meal.   mirabegron ER (MYRBETRIQ) 25 MG TB24 tablet Take 1 tablet (25 mg total) by mouth daily.   mirtazapine (REMERON) 15 MG tablet Take 1 tablet (15 mg total) by mouth at bedtime.   Multiple  Vitamin (MULTIVITAMIN WITH MINERALS) TABS tablet Take 1 tablet by mouth in the morning.   Probiotic Product (PROBIOTIC PO) Take 1 capsule by mouth daily.   Respiratory Therapy Supplies (FLUTTER) DEVI Use as directed   sodium chloride HYPERTONIC 3 % nebulizer solution Take by nebulization 2 (two) times daily as needed for cough. Dx J44.9   tamsulosin (FLOMAX) 0.4 MG CAPS capsule Take 0.4 mg by mouth daily.   temazepam (RESTORIL) 15 MG capsule Take 1 capsule (15 mg total) by mouth at bedtime.   torsemide (DEMADEX) 10 MG tablet Take 1 tablet (10 mg total) by mouth daily.   trimethoprim (TRIMPEX) 100 MG tablet Take 100 mg by mouth daily.   No current facility-administered medications on file prior to visit.     Allergies  Allergen Reactions   Tramadol     hallucinations    Contrast Media [Iodinated Contrast Media] Rash   Iohexol Hives   Morphine And Codeine Other (See Comments)    Reaction:  Hallucinations    Methylprednisolone Other (See Comments)    Reaction:  Decreases pts heart rate    Hydrocodone Itching   Oxycodone-Acetaminophen Rash and Other (See Comments)    Reaction:  Hallucinations    Phenytoin Sodium Extended Rash   Zanaflex [Tizanidine Hydrochloride] Rash    Review Of Systems:  Constitutional:   ++  weight loss, night sweats,  Fevers, chills,++ fatigue, or  lassitude.  HEENT:   No headaches,  Difficulty swallowing,  Tooth/dental problems, or  Sore throat,                No sneezing, itching, ear ache, nasal congestion, post nasal drip,   CV:  + chest tightness,  Orthopnea, PND, swelling in lower extremities, anasarca, dizziness, palpitations, syncope.   GI  No heartburn, indigestion, abdominal pain, nausea, vomiting, diarrhea, change in bowel habits, ++loss of appetite, bloody stools.   Resp: ++ shortness of breath with exertion and  at rest.  ++ excess mucus, ++ productive cough,  ++ non-productive cough,  No coughing up of blood.  + change in color of mucus.  ++  wheezing.  No chest wall deformity  Skin: no rash or lesions.  GU: no dysuria, change in color of urine, no urgency or frequency.  No flank pain, no hematuria   MS:  No joint pain or swelling.  + decreased range of motion.  No back pain.  Psych: Extreme anxiety, fearful   Vital Signs BP (!) 90/58 (BP Location: Right Arm)  Pulse (!) 113   Ht 5\' 3"  (1.6 m)   Wt 94 lb (42.6 kg)   SpO2 96% Comment: On 6L of oxygen  BMI 16.65 kg/m    Physical Exam:  General- Acute Respiratory Distress with nasal flaring, sternal retractions and accessory muscle use,  A&Ox3, coughing, unable to speak due to dyspnea ENT: No sinus tenderness, TM clear, pale nasal mucosa, no oral exudate,no post nasal drip, no LAN, dry mouth Cardiac: S1, S2, rapid rate and rhythm, no murmur Chest: ++ Audible  wheezes/ No rales/ ++ dullness bilaterally per bases; ++ accessory muscle use, ++ nasal flaring, ++ sternal retractions Abd.: Soft Non-tender, ND, BS +, Body mass index is 16.65 kg/m.  Ext: No clubbing cyanosis, edema, feet bluish in color Neuro:  Physical deconditioning, in wheelchair, wasted muscle mass Skin: No rashes, warm and dry, cyanotic on arrival Psych: anxious, fearful, working very hard to breath   Assessment/Plan Acute Hypoxic Respiratory Failure Recent Bilateral lower lobe CAP requiring hospitalization 6/9-6/13/2024 ( Streptococcus pneumonia and Rhinovirus )>> highly suspect recurrance Acute exacerbation of bronchiectasis and asthma.  Immunodeficiency>> IVIG therapy stopped 06/15/2022 MS Bilateral vocal cord immobility with small trans-glottal airway.  Plan Xopenex neb now Transfer to ED via EMS Titrate oxygen to maintain oxygen saturations> 92% at all times Scheduled nebs as below CXR/ CT Chest  asap CBC and blood Cx ABX >> based on imaging results  Sputum Cx/ Sensitivities BD>> yupelri, brovana, pulmicort - hypertonic saline nebulizer bid for 2 days - prn albuterol - flutter valve bid,  chest percussion -Solumedrol 80 mg BID x 2 doses  and taper  Immunotherapy referral and resumption of IVIG therapy( followed by Dr. Loralyn Freshwater with [facility] in [place], [place] ) - mucinex bid as mucolytic - will need to arrange for chest percussion vest as an outpt - Will need OP oxygen.  - Follow up with Pulmonary within 1 week of discharge.   I spent 80 minutes dedicated to the care of this patient on the date of this encounter to include pre-visit review of records, face-to-face time with the patient discussing conditions above, post visit ordering of testing, clinical documentation with the electronic health record, making appropriate referrals as documented, and communicating necessary information to the patient's healthcare team.    Bevelyn Ngo, MSN, AGACNP-BC [facility] See Amion for personal pager PCCM on call pager [phone]  09/16/2022  12:36 PM

## 2022-09-16 NOTE — H&P (Addendum)
History and Physical  Kayla [name] UJW:[id number] DOB: [birth year] DOA: 09/16/2022  PCP: [name], MD   Chief Complaint: Shortness of breath  HPI: Kayla [name] is a 72 y.o. female with medical history significant for multiple sclerosis, bilateral vocal cord immobility, immunodeficiency on IVIG, hypothyroidism, GERD, recently admitted to [facility] from 6/9 to 6/13 for aspiration pneumonia, right lower lobe multifocal mucous plugging, rhinovirus positive and also found to have strep pneumo bacteremia.  She was treated in the hospital, weaned to room air, and discharged home to complete 10 more days of p.o. Ceftin.  Patient states she did not really improve after leaving the hospital.  She was having some side effects, so cefdinir was stopped on day 8 by her PCP.  She went to her outpatient pulmonology follow-up today, was noted to have sats of 84%, EMS was called, given her bronchodilator and brought to the emergency department.  ED Course: Patient saturating 99% on 4 L nasal cannula on arrival to the ER, tachycardic heart rate 102.  Blood pressure 113/51.  Lab work shows WBC 40 K, hemoglobin 10 and stable, platelets 815.  Hyponatremia 129, electrolytes and renal function at normal baseline.  Chest x-ray demonstrates small bilateral pleural effusion, bilateral pneumonia right sided consolidation slightly worse than the left.  Patient was given empiric IV vancomycin, and IV cefepime, started on IV fluids, and hospitalist was contacted for admission.  Pulmonology was also consulted prior to hospitalist.  Review of Systems: Please see HPI for pertinent positives and negatives. A complete 10 system review of systems are otherwise negative.  Past Medical History:  Diagnosis Date   Arthritis    oa   Asthma    Bilateral vocal cord paralysis    Bronchiectasis (HCC)    Cervical cancer (HCC) 2005   Depression    GERD (gastroesophageal reflux disease)    Hiatal hernia    Hypothyroidism     Immunodeficiency (HCC)    Gets IVIG infusions   Insomnia    Multiple sclerosis (HCC) 1996   Osteoporosis    Pneumonia 2024, 2014, 1992   Urine retention    Past Surgical History:  Procedure Laterality Date   ABDOMINAL HYSTERECTOMY  03/25/2003   for cervical cancer, complete   BRONCHIAL BIOPSY  10/26/2020   Procedure: BRONCHIAL BIOPSIES;  Surgeon: Josephine Igo, DO;  Location: [facility];  Service: Pulmonary;;   BRONCHIAL BRUSHINGS  10/26/2020   Procedure: BRONCHIAL BRUSHINGS;  Surgeon: Josephine Igo, DO;  Location: [facility];  Service: Pulmonary;;   BRONCHIAL WASHINGS  10/26/2020   Procedure: BRONCHIAL WASHINGS;  Surgeon: Josephine Igo, DO;  Location: [facility];  Service: Pulmonary;;   BRONCHIAL WASHINGS  08/19/2022   Procedure: BRONCHIAL WASHINGS;  Surgeon: Josephine Igo, DO;  Location: [facility];  Service: Cardiopulmonary;;   CATARACT EXTRACTION, BILATERAL Bilateral    lens for cataracts   CONVERSION TO TOTAL HIP Right 09/18/2015   Procedure: CONVERSION OF RIGHT HEMI ARTHROPLASTY TO TOTAL HIP ARTHROPLASTY ACETABULAR REVISON ;  Surgeon: Durene Romans, MD;  Location: [facility];  Service: Orthopedics;  Laterality: Right;   HIP ARTHROPLASTY Right 03/01/2013   Procedure: ARTHROPLASTY BIPOLAR HIP;  Surgeon: Shelda Pal, MD;  Location: [facility];  Service: Orthopedics;  Laterality: Right;   HIP CLOSED REDUCTION Right 03/12/2013   Procedure: CLOSED REDUCTION HIP;  Surgeon: [name], MD;  Location: [facility];  Service: Orthopedics;  Laterality: Right;   HIP CLOSED REDUCTION Right 03/19/2013   Procedure: CLOSED  REDUCTION HIP;  Surgeon: Javier Docker, MD;  Location: [facility];  Service: Orthopedics;  Laterality: Right;   HIP CLOSED REDUCTION Right 11/10/2015   Procedure: CLOSED MANIPULATION HIP;  Surgeon: Samson Frederic, MD;  Location: [facility];  Service: Orthopedics;  Laterality: Right;   HIP CLOSED REDUCTION Right 06/05/2017   Procedure: CLOSED REDUCTION HIP;  Surgeon:  Ranee Gosselin, MD;  Location: [facility];  Service: Orthopedics;  Laterality: Right;   SMALL INTESTINE SURGERY     TOTAL HIP REVISION Right 06/06/2017   Procedure: TOTAL HIP REVISION;  Surgeon: Ranee Gosselin, MD;  Location: [facility];  Service: Orthopedics;  Laterality: Right;   VESICOVAGINAL FISTULA CLOSURE W/ TAH  03/25/2003   VIDEO BRONCHOSCOPY N/A 10/26/2020   Procedure: VIDEO BRONCHOSCOPY WITH FLUORO;  Surgeon: Josephine Igo, DO;  Location: [facility];  Service: Pulmonary;  Laterality: N/A;   VIDEO BRONCHOSCOPY Left 08/19/2022   Procedure: VIDEO BRONCHOSCOPY WITH FLUORO; WITH BAL;  Surgeon: Josephine Igo, DO;  Location: [facility];  Service: Cardiopulmonary;  Laterality: Left;    Social History:  reports that she has never smoked. She has never used smokeless tobacco. She reports that she does not currently use alcohol after a past usage of about 2.0 standard drinks of alcohol per week. She reports that she does not use drugs.   Allergies  Allergen Reactions   Tramadol     hallucinations    Contrast Media [Iodinated Contrast Media] Rash   Iohexol Hives   Morphine And Codeine Other (See Comments)    Reaction:  Hallucinations    Methylprednisolone Other (See Comments)    Reaction:  Decreases pts heart rate    Hydrocodone Itching   Oxycodone-Acetaminophen Rash and Other (See Comments)    Reaction:  Hallucinations    Phenytoin Sodium Extended Rash   Zanaflex [Tizanidine Hydrochloride] Rash    Family History  Problem Relation Age of Onset   Other Mother        COVID   Colon cancer Father    Atrial fibrillation Brother    Heart attack Maternal Grandfather    Parkinson's disease Paternal Grandfather    Esophageal cancer Neg Hx    Stomach cancer Neg Hx    Rectal cancer Neg Hx      Prior to Admission medications   Medication Sig Start Date End Date Taking? Authorizing Provider  albuterol (VENTOLIN HFA) 108 (90 Base) MCG/ACT inhaler INHALE 2 PUFFS INTO LUNGS EVERY 6  HOURS AS NEEDED. Patient taking differently: Inhale 1 puff into the lungs every 6 (six) hours as needed for shortness of breath. 08/29/22  Yes Kalman Shan, MD  calcium carbonate (OS-CAL - DOSED IN MG OF ELEMENTAL CALCIUM) 1250 (500 Ca) MG tablet Take 1 tablet (1,250 mg total) by mouth daily with breakfast. 06/22/22  Yes Arnetha Courser, MD  Coenzyme Q10 (VITALINE COQ10) 300 MG WAFR Take 300 mg by mouth in the morning.   Yes [provider]  dexlansoprazole (DEXILANT) 60 MG capsule TAKE 1 CAPSULE BY MOUTH  ONCE DAILY WITH BREAKFAST Patient taking differently: Take 60 mg by mouth daily. TAKE 1 CAPSULE BY MOUTH  ONCE DAILY WITH BREAKFAST 05/12/22  Yes [name], MD  docusate sodium (COLACE) 100 MG capsule Take 1 capsule (100 mg total) by mouth in the morning and at bedtime. 09/04/22  Yes Glade Lloyd, MD  famotidine (PEPCID) 20 MG tablet One after supper Patient taking differently: Take 20 mg by mouth at bedtime. One after supper 06/07/20  Yes [name],  Charlaine Dalton, MD  Ferric Maltol (ACCRUFER) 30 MG CAPS Take 1 capsule (30 mg total) by mouth in the morning and at bedtime. Patient taking differently: Take 30 mg by mouth in the morning and at bedtime. 09/09/22  Yes [name], MD  FLUoxetine (PROZAC) 40 MG capsule Take 1 capsule (40 mg total) by mouth daily. 04/09/22  Yes [name], MD  Fluticasone-Umeclidin-Vilant (TRELEGY ELLIPTA) 100-62.5-25 MCG/ACT AEPB Inhale 1 puff into the lungs daily. 05/26/22  Yes [name], MD  guaiFENesin (MUCINEX) 600 MG 12 hr tablet Take 1 tablet (600 mg total) by mouth 2 (two) times daily. Patient taking differently: Take 600 mg by mouth 2 (two) times daily as needed for cough or to loosen phlegm. 09/04/22  Yes Glade Lloyd, MD  levothyroxine (SYNTHROID) 100 MCG tablet TAKE 1 TABLET BY MOUTH ONCE  DAILY BEFORE BREAKFAST Patient taking differently: Take 100 mcg by mouth daily before breakfast. 12/31/21  Yes [name], MD  methenamine (HIPREX) 1  g tablet Take 1 tablet (1 g total) by mouth 2 (two) times daily with a meal. 08/06/22  Yes [name], MD  mirabegron ER (MYRBETRIQ) 50 MG TB24 tablet Take 50 mg by mouth daily.   Yes [provider]  mirtazapine (REMERON) 15 MG tablet Take 1 tablet (15 mg total) by mouth at bedtime. 09/09/22  Yes [name], MD  Multiple Vitamin (MULTIVITAMIN WITH MINERALS) TABS tablet Take 1 tablet by mouth in the morning.   Yes [provider]  Probiotic Product (PROBIOTIC PO) Take 1 capsule by mouth daily.   Yes [provider]  sodium chloride HYPERTONIC 3 % nebulizer solution Take by nebulization 2 (two) times daily as needed for cough. Dx J44.9 Patient taking differently: Take 4 mLs by nebulization 2 (two) times daily as needed for cough. Dx J44.9 08/29/22  Yes Kalman Shan, MD  tamsulosin (FLOMAX) 0.4 MG CAPS capsule Take 0.4 mg by mouth daily.   Yes [provider]  temazepam (RESTORIL) 15 MG capsule Take 1 capsule (15 mg total) by mouth at bedtime. 07/11/22  Yes [name], MD  torsemide (DEMADEX) 10 MG tablet Take 1 tablet (10 mg total) by mouth daily. 07/08/22  Yes [name], MD  trimethoprim (TRIMPEX) 100 MG tablet Take 100 mg by mouth daily. 11/02/21  Yes [provider]  dronabinol (MARINOL) 2.5 MG capsule Take 1 capsule (2.5 mg total) by mouth 2 (two) times daily before lunch and supper. Patient not taking: Reported on 09/16/2022 07/08/22   [name], MD  mirabegron ER (MYRBETRIQ) 25 MG TB24 tablet Take 1 tablet (25 mg total) by mouth daily. Patient not taking: Reported on 09/16/2022 09/04/22   Glade Lloyd, MD  Respiratory Therapy Supplies (FLUTTER) DEVI Use as directed 06/02/17   [name], MD    Physical Exam: BP (!) 113/51   Pulse 94   Temp 99.4 F (37.4 C) (Oral)   Resp (!) 25   Ht 5\' 3"  (1.6 m)   Wt 42.6 kg   SpO2 97%   BMI 16.64 kg/m   General: Thin frail appearing female, somewhat anxious, wearing 6 L nasal  cannula oxygen.  Husband at the bedside.  Audible upper airway rhonchi, patient and husband state this is actually improved from her baseline. Eyes: EOMI, clear conjuctivae, white sclerea Neck: supple, no masses, trachea mildline  Cardiovascular: RRR, no murmurs or rubs, no peripheral edema  Respiratory: She has very little bilateral air movement,  she is tachypneic, with mild retractions.  She is able to speak several words at a time, has intermittent dry cough. Abdomen: soft, nontender, nondistended, normal bowel tones heard  Skin: dry, no rashes  Musculoskeletal: no joint effusions, normal range of motion  Psychiatric: appropriate affect, normal speech  Neurologic: extraocular muscles intact, clear speech, moving all extremities with intact sensorium          Labs on Admission:  Basic Metabolic Panel: Recent Labs  Lab 09/16/22 1307  NA 129*  K 3.9  CL 92*  CO2 24  GLUCOSE 127*  BUN 12  CREATININE 0.74  CALCIUM 9.5   Liver Function Tests: Recent Labs  Lab 09/16/22 1307  AST 19  ALT 17  ALKPHOS 109  BILITOT 0.5  PROT 6.8  ALBUMIN 2.8*   No results for input(s): "LIPASE", "AMYLASE" in the last 168 hours. No results for input(s): "AMMONIA" in the last 168 hours. CBC: Recent Labs  Lab 09/16/22 1307  WBC 40.0*  NEUTROABS 36.8*  HGB 10.4*  HCT 32.6*  MCV 90.1  PLT 815*   Cardiac Enzymes: No results for input(s): "CKTOTAL", "CKMB", "CKMBINDEX", "TROPONINI" in the last 168 hours.  BNP (last 3 results) Recent Labs    08/31/22 1703  BNP 106.3*    ProBNP (last 3 results) Recent Labs    01/14/22 0824  PROBNP 112.0*    CBG: No results for input(s): "GLUCAP" in the last 168 hours.  Radiological Exams on Admission: DG Chest Port 1 View  Result Date: 09/16/2022 CLINICAL DATA:  Cough and dyspnea. EXAM: PORTABLE CHEST 1 VIEW COMPARISON:  Chest radiograph dated September 04, 2022 FINDINGS: The heart size and mediastinal contours are within normal limits. Bilateral  lower lobe hazy lung opacities, right worse than the left with right basilar atelectasis, suggesting ongoing infectious/inflammatory process. Small bilateral pleural effusion. The visualized skeletal structures are unremarkable. IMPRESSION: 1. Bilateral lower lobe hazy lung opacities, right worse than the left with right basilar atelectasis, suggesting ongoing infectious/inflammatory process. 2. Small bilateral pleural effusion. Electronically Signed   By: Larose Hires D.O.   On: 09/16/2022 14:35    Assessment/Plan Kayla [name] is a 72 y.o. female with medical history significant for multiple sclerosis, bilateral vocal cord immobility, immunodeficiency on IVIG, hypothyroidism, GERD, recent hospital stay for strep pneumo bacteremia and pneumonia being admitted to the hospital with continued and worsening hypoxic respiratory failure and healthcare acquired pneumonia.  Acute hypoxic respiratory failure due to worsening bilateral pneumonia -Inpatient admission to stepdown -Appreciate pulmonology consultation -Continue supplemental oxygen -Empiric IV vancomycin and IV cefepime -Check blood culture -Check sputum culture -Check respiratory panel -Check procalcitonin -IV Solu-Medrol -Incentive spirometry -Flutter valve -Mucinex twice daily  Hyponatremia-this is likely hypovolemic hyponatremia due to recent illness and reduced oral intake -Continue hydration with LR -Recheck sodium level in the morning  Depression-Prozac  GERD-Protonix p.o.  Hypothyroidism-Synthroid 100 mcg p.o. daily  History of immune suppression, with IVIG therapy-unfortunately has been off of IVIG since earlier this year due to multiple acute illnesses  Anemia-chronic and stable  Sonya-Restoril at bedtime  DVT prophylaxis: Lovenox     Code Status: Full Code  Consults called: Pulmonary  Admission status: The appropriate patient status for this patient is INPATIENT. Inpatient status is judged to be reasonable and  necessary in order to provide the required intensity of service to ensure the patient's safety. [name] presenting symptoms, physical exam findings, and initial radiographic and laboratory data in the context of their chronic comorbidities is felt  to place them at high risk for further clinical deterioration. Furthermore, it is not anticipated that the patient will be medically stable for discharge from the hospital within 2 midnights of admission.    I certify that at the point of admission it is my clinical judgment that the patient will require inpatient hospital care spanning beyond 2 midnights from the point of admission due to high intensity of service, high risk for further deterioration and high frequency of surveillance required  Time spent: 65 minutes  Kayla Manocchio Sharlette Dense MD [facility] Pager [phone]  If 7PM-7AM, please contact night-coverage [email or web address] Password [facility]  09/16/2022, 4:29 PM

## 2022-09-16 NOTE — ED Triage Notes (Signed)
Pt BIBA from dr office. Pt was satting 80 on RA and c/o SOB. MD gave zopinex neb. EMS gave 5 mg albuterol, and .5 mg atrovent. Inspiratory wheezing w/ decreased lung sounds on both sides.  Coughing up brown sputum. Recently treated for pneumonia.

## 2022-09-16 NOTE — Progress Notes (Signed)
Pharmacy Antibiotic Note  Kayla [name] is a 72 y.o. female admitted on 09/16/2022 with pneumonia.  Pharmacy has been consulted for cefepime and vancomycin dosing.  In ED today,  vancomycin 750 mg IV administered at 1538 cefepime 2 g IV administered at 1519  Plan: Continue cefepime 2 g IV every 12 hours for est CrCl 30-60 ml/min Continue vancomycin at 1000 mg IV every 36 hours (Goal AUC 400-550, eAUC 531.1, SCr used: rounded up to 0.8) Monitor clinical progress, renal function, vancomycin levels as indicated F/U MRSA PCR, C&S, abx deescalation / LOT   Height: 5\' 3"  (160 cm) Weight: 42.6 kg (93 lb 14.7 oz) IBW/kg (Calculated) : 52.4  Temp (24hrs), Avg:98.6 F (37 C), Min:97.9 F (36.6 C), Max:99.4 F (37.4 C)  Recent Labs  Lab 09/16/22 1307 09/16/22 1312  WBC 40.0*  --   CREATININE 0.74  --   LATICACIDVEN  --  1.4    Estimated Creatinine Clearance: 44 mL/min (by C-G formula based on SCr of 0.74 mg/dL).    Allergies  Allergen Reactions   Tramadol     hallucinations    Contrast Media [Iodinated Contrast Media] Rash   Iohexol Hives   Morphine And Codeine Other (See Comments)    Reaction:  Hallucinations    Methylprednisolone Other (See Comments)    Reaction:  Decreases pts heart rate    Hydrocodone Itching   Oxycodone-Acetaminophen Rash and Other (See Comments)    Reaction:  Hallucinations    Phenytoin Sodium Extended Rash   Zanaflex [Tizanidine Hydrochloride] Rash    Antimicrobials this admission: 6/25 cefepime >>  6/25 vancomycin >>    Microbiology results: 6/25 BCx: sent 6/25 MRSA PCR: to be collected   Thank you for allowing pharmacy to be a part of this patient's care.  Selinda Eon, PharmD, BCPS Clinical Pharmacist [facility] 09/16/2022 6:12 PM

## 2022-09-17 ENCOUNTER — Inpatient Hospital Stay (HOSPITAL_COMMUNITY): Payer: Medicare Other

## 2022-09-17 DIAGNOSIS — J9621 Acute and chronic respiratory failure with hypoxia: Secondary | ICD-10-CM | POA: Diagnosis not present

## 2022-09-17 DIAGNOSIS — J189 Pneumonia, unspecified organism: Secondary | ICD-10-CM | POA: Diagnosis not present

## 2022-09-17 DIAGNOSIS — R7881 Bacteremia: Secondary | ICD-10-CM | POA: Diagnosis not present

## 2022-09-17 LAB — ECHOCARDIOGRAM COMPLETE
AR max vel: 1.78 cm2
AV Area VTI: 1.81 cm2
AV Area mean vel: 1.7 cm2
AV Mean grad: 5 mmHg
AV Peak grad: 8.2 mmHg
Ao pk vel: 1.44 m/s
Area-P 1/2: 3.01 cm2
Calc EF: 63.1 %
Height: 63 in
MV VTI: 2.12 cm2
S' Lateral: 2.7 cm
Single Plane A2C EF: 61.7 %
Single Plane A4C EF: 62.6 %
Weight: 1580.26 oz

## 2022-09-17 LAB — RESPIRATORY PANEL BY PCR

## 2022-09-17 LAB — BLOOD CULTURE ID PANEL (REFLEXED) - BCID2

## 2022-09-17 LAB — CBC
HCT: 30.7 % — ABNORMAL LOW (ref 36.0–46.0)
Hemoglobin: 9 g/dL — ABNORMAL LOW (ref 12.0–15.0)
MCH: 28.3 pg (ref 26.0–34.0)
MCHC: 29.3 g/dL — ABNORMAL LOW (ref 30.0–36.0)
MCV: 96.5 fL (ref 80.0–100.0)
Platelets: 622 10*3/uL — ABNORMAL HIGH (ref 150–400)
RBC: 3.18 MIL/uL — ABNORMAL LOW (ref 3.87–5.11)
RDW: 14.8 % (ref 11.5–15.5)
WBC: 23.7 10*3/uL — ABNORMAL HIGH (ref 4.0–10.5)
nRBC: 0 % (ref 0.0–0.2)

## 2022-09-17 LAB — BASIC METABOLIC PANEL
Anion gap: 9 (ref 5–15)
BUN: 14 mg/dL (ref 8–23)
CO2: 22 mmol/L (ref 22–32)
Calcium: 9.1 mg/dL (ref 8.9–10.3)
Chloride: 105 mmol/L (ref 98–111)
Creatinine, Ser: 0.53 mg/dL (ref 0.44–1.00)
GFR, Estimated: >60 mL/min (ref >60)
Glucose, Bld: 168 mg/dL — ABNORMAL HIGH (ref 70–99)
Potassium: 4.3 mmol/L (ref 3.5–5.1)
Sodium: 136 mmol/L (ref 135–145)

## 2022-09-17 LAB — CULTURE, BLOOD (SINGLE)

## 2022-09-17 LAB — MAGNESIUM: Magnesium: 1.7 mg/dL (ref 1.7–2.4)

## 2022-09-17 MED ORDER — SODIUM CHLORIDE 0.9 % IV SOLN: INTRAVENOUS | Status: DC | PRN

## 2022-09-17 MED ORDER — SODIUM CHLORIDE 0.9 % IV SOLN
2.0000 g | INTRAVENOUS | Status: DC
  Administered 2022-09-17 – 2022-09-24 (×8): 2 g via INTRAVENOUS
  Filled 2022-09-17 (×8): qty 20

## 2022-09-17 MED ORDER — ALBUTEROL SULFATE (2.5 MG/3ML) 0.083% IN NEBU: 2.5000 mg | INHALATION_SOLUTION | Freq: Four times a day (QID) | RESPIRATORY_TRACT | Status: DC

## 2022-09-17 NOTE — Progress Notes (Signed)
PROGRESS NOTE    Kayla [name]  XBJ:[id number] DOB: November 10, 1950 DOA: 09/16/2022 PCP: [name], MD  Chief Complaint  Patient presents with   Shortness of Breath    Brief Narrative:   Kayla [name] is Kayla [name] 72 y.o. female with medical history significant for multiple sclerosis, bilateral vocal cord immobility, immunodeficiency on IVIG, hypothyroidism, GERD, recently admitted to [facility] from 6/9 to 6/13 for aspiration pneumonia, right lower lobe multifocal mucous plugging, rhinovirus positive and also found to have strep pneumo bacteremia.  She was treated in the hospital, weaned to room air, and discharged home to complete 10 more days of p.o. Ceftin.  Patient states she did not really improve after leaving the hospital.  She was having some side effects, so cefdinir was stopped on day 8 by her PCP.  She went to her outpatient pulmonology follow-up today, was noted to have sats of 84%, EMS was called, given her bronchodilator and brought to the emergency department.   ED Course: Patient saturating 99% on 4 L nasal cannula on arrival to the ER, tachycardic heart rate 102.  Blood pressure 113/51.  Lab work shows WBC 40 K, hemoglobin 10 and stable, platelets 815.  Hyponatremia 129, electrolytes and renal function at normal baseline.  Chest x-ray demonstrates small bilateral pleural effusion, bilateral pneumonia right sided consolidation slightly worse than the left.  Patient was given empiric IV vancomycin, and IV cefepime, started on IV fluids, and hospitalist was contacted for admission.  Pulmonology was also consulted prior to hospitalist.  Assessment & Plan:   Active Problems:   HCAP (healthcare-associated pneumonia)  Kayla [name] is Kayla [name] 72 y.o. female with medical history significant for multiple sclerosis, bilateral vocal cord immobility, immunodeficiency on IVIG, hypothyroidism, GERD, recent hospital stay for strep pneumo bacteremia and pneumonia being admitted to the hospital with  continued and worsening hypoxic respiratory failure and healthcare acquired pneumonia.   Acute hypoxic respiratory failure  Community Acquired Pneumonia Recurrent vs Nonresolving Pneumonia Rhinovirus Infection  -pulmonary c/s, appreciate recs --RVP with rhinovirus (this was present 2 weeks ago, unclear significance) --sputum cx, urine strep, urine leginella -- blood cultures pending (gram positive cocci, awaiting BCID)   - wean O2 as tolerated -- IV steroids, broad spectrum abx (narrow as appropriate)  -- scheduled and prn nebs -- flutter valve -- strict I/O, daily weights    Gram Positive Bacteremia Recent Strep Pneumo Bacteremia Recent strep pneumo bacteremia, concerning for recurrent or inadequately treated bacteremia Follow final cultures, consider ID consult Will get TTE  Hyponatremia-resolved, follow    Depression-Prozac, remeron   GERD-Protonix p.o.   Hypothyroidism-Synthroid 100 mcg p.o. daily   History of immune deficiency, with IVIG therapy- due to MS immunomodulatory therapy.  Followed by Dr. Livingston Diones with [facility] in [place], [place].  has been off of IVIG since hospitalization for pubic ramus fracture in March.      Anemia- gradual downtrend over last few months Iron, folate, b12, ferritin   Insomnia- restoril, remeron at bedtime  Overactive Bladder Myrbetriq  Multiple Sclerosis Followed by neurologist in [place], Dr. Konrad Felix per previous note  Bilateral Vocal Cord Immobility with Small Transglottal Airway Followed by Dr. Harriette Ohara with ENT at [facility]    DVT prophylaxis: lovenox Code Status: full Family Communication: none Disposition:   Status is: Inpatient Remains inpatient appropriate because: need for continued inpatient care   Consultants:  PCCM  Procedures:  none  Antimicrobials:  Anti-infectives (From admission, onward)  Start     Dose/Rate Route Frequency Ordered Stop    09/18/22 0400  vancomycin (VANCOCIN) IVPB 1000 mg/200 mL premix        1,000 mg 200 mL/hr over 60 Minutes Intravenous Every 36 hours 09/16/22 1809     09/17/22 0000  ceFEPIme (MAXIPIME) 2 g in sodium chloride 0.9 % 100 mL IVPB  Status:  Discontinued        2 g 200 mL/hr over 30 Minutes Intravenous Every 24 hours 09/16/22 1638 09/16/22 1639   09/17/22 0000  ceFEPIme (MAXIPIME) 500 mg in dextrose 5 % 50 mL IVPB  Status:  Discontinued        500 mg 110 mL/hr over 30 Minutes Intravenous Every 24 hours 09/16/22 1639 09/16/22 1800   09/16/22 2200  ceFEPIme (MAXIPIME) 2 g in sodium chloride 0.9 % 100 mL IVPB        2 g 200 mL/hr over 30 Minutes Intravenous Every 12 hours 09/16/22 1802     09/16/22 1500  vancomycin (VANCOREADY) IVPB 750 mg/150 mL        750 mg 150 mL/hr over 60 Minutes Intravenous  Once 09/16/22 1447 09/16/22 1638   09/16/22 1500  ceFEPIme (MAXIPIME) 2 g in sodium chloride 0.9 % 100 mL IVPB        2 g 200 mL/hr over 30 Minutes Intravenous  Once 09/16/22 1451 09/16/22 1549   09/16/22 1445  ceFEPIme (MAXIPIME) 1 g in sodium chloride 0.9 % 100 mL IVPB  Status:  Discontinued        1 g 200 mL/hr over 30 Minutes Intravenous  Once 09/16/22 1438 09/16/22 1452       Subjective: No new complaints, feels better than when she got here  Objective: Vitals:   09/17/22 0600 09/17/22 0742 09/17/22 0744 09/17/22 0800  BP: (!) 103/49     Pulse: 73     Resp: 14     Temp:    98 F (36.7 C)  TempSrc:    Oral  SpO2: 99% 98% 100%   Weight:      Height:        Intake/Output Summary (Last 24 hours) at 09/17/2022 3244 Last data filed at 09/17/2022 0400 Gross per 24 hour  Intake 2047.93 ml  Output 1000 ml  Net 1047.93 ml   Filed Weights   09/16/22 1305 09/16/22 1811  Weight: 42.6 kg 44.8 kg    Examination:  General exam: chronically ill appearing, no distress Respiratory system: diminished, frequent hacking cough while I was in the room, frequently required  suctioning Cardiovascular system: RRR Central nervous system: Alert and oriented. No focal neurological deficits. Extremities: no LEE    Data Reviewed: I have personally reviewed following labs and imaging studies  CBC: Recent Labs  Lab 09/16/22 1307 09/17/22 0325  WBC 40.0* 23.7*  NEUTROABS 36.8*  --   HGB 10.4* 9.0*  HCT 32.6* 30.7*  MCV 90.1 96.5  PLT 815* 622*    Basic Metabolic Panel: Recent Labs  Lab 09/16/22 1307 09/17/22 0325  NA 129* 136  K 3.9 4.3  CL 92* 105  CO2 24 22  GLUCOSE 127* 168*  BUN 12 14  CREATININE 0.74 0.53  CALCIUM 9.5 9.1  MG  --  1.7    GFR: Estimated Creatinine Clearance: 46.3 mL/min (by C-G formula based on SCr of 0.53 mg/dL).  Liver Function Tests: Recent Labs  Lab 09/16/22 1307  AST 19  ALT 17  ALKPHOS 109  BILITOT 0.5  PROT  6.8  ALBUMIN 2.8*    CBG: No results for input(s): "GLUCAP" in the last 168 hours.   Recent Results (from the past 240 hour(s))  Culture, blood (single)     Status: None (Preliminary result)   Collection Time: 09/16/22  2:08 PM   Specimen: BLOOD RIGHT FOREARM  Result Value Ref Range Status   Specimen Description   Final    BLOOD RIGHT FOREARM Performed at [facility], [address], [place], [place] [address]    Special Requests   Final    BOTTLES DRAWN AEROBIC AND ANAEROBIC Blood Culture adequate volume Performed at [facility], [address], [place], [place] [address]    Culture  Setup Time   Final    GRAM POSITIVE COCCI IN BOTH AEROBIC AND ANAEROBIC BOTTLES Organism ID to follow Performed at [facility], [address], [place], [place] [address]    Culture GRAM POSITIVE COCCI  Final   Report Status PENDING  Incomplete  MRSA Next Gen by PCR, Nasal     Status: None   Collection Time: 09/16/22  5:38 PM   Specimen: Nasal Mucosa; Nasal Swab  Result Value Ref Range Status   MRSA by PCR Next Gen NOT DETECTED NOT DETECTED Final     Comment: (NOTE) The GeneXpert MRSA Assay (FDA approved for NASAL specimens only), is one component of Tesean Stump comprehensive MRSA colonization surveillance program. It is not intended to diagnose MRSA infection nor to guide or monitor treatment for MRSA infections. Test performance is not FDA approved in patients less than 54 years old. Performed at [facility], [address], [place], [place] [address]   Respiratory (~20 pathogens) panel by PCR     Status: Abnormal   Collection Time: 09/16/22  6:47 PM   Specimen: Nasopharyngeal Swab; Respiratory  Result Value Ref Range Status   Adenovirus NOT DETECTED NOT DETECTED Final   Coronavirus 229E NOT DETECTED NOT DETECTED Final    Comment: (NOTE) The Coronavirus on the Respiratory Panel, DOES NOT test for the novel  Coronavirus (2019 nCoV)    Coronavirus HKU1 NOT DETECTED NOT DETECTED Final   Coronavirus NL63 NOT DETECTED NOT DETECTED Final   Coronavirus OC43 NOT DETECTED NOT DETECTED Final   Metapneumovirus NOT DETECTED NOT DETECTED Final   Rhinovirus / Enterovirus DETECTED (Aldonia Keeven) NOT DETECTED Final   Influenza Siyana Erney NOT DETECTED NOT DETECTED Final   Influenza B NOT DETECTED NOT DETECTED Final   Parainfluenza Virus 1 NOT DETECTED NOT DETECTED Final   Parainfluenza Virus 2 NOT DETECTED NOT DETECTED Final   Parainfluenza Virus 3 NOT DETECTED NOT DETECTED Final   Parainfluenza Virus 4 NOT DETECTED NOT DETECTED Final   Respiratory Syncytial Virus NOT DETECTED NOT DETECTED Final   Bordetella pertussis NOT DETECTED NOT DETECTED Final   Bordetella Parapertussis NOT DETECTED NOT DETECTED Final   Chlamydophila pneumoniae NOT DETECTED NOT DETECTED Final   Mycoplasma pneumoniae NOT DETECTED NOT DETECTED Final    Comment: Performed at [facility], [address] [place], [place] [address]         Radiology Studies: DG Chest Port 1 View  Result Date: 09/16/2022 CLINICAL DATA:  Cough and dyspnea. EXAM: PORTABLE CHEST 1 VIEW  COMPARISON:  Chest radiograph dated September 04, 2022 FINDINGS: The heart size and mediastinal contours are within normal limits. Bilateral lower lobe hazy lung opacities, right worse than the left with right basilar atelectasis, suggesting ongoing infectious/inflammatory process. Small bilateral pleural effusion.  The visualized skeletal structures are unremarkable. IMPRESSION: 1. Bilateral lower lobe hazy lung opacities, right worse than the left with right basilar atelectasis, suggesting ongoing infectious/inflammatory process. 2. Small bilateral pleural effusion. Electronically Signed   By: Larose Hires D.O.   On: 09/16/2022 14:35        Scheduled Meds:  arformoterol  15 mcg Nebulization BID   budesonide (PULMICORT) nebulizer solution  0.25 mg Nebulization BID   Chlorhexidine Gluconate Cloth  6 each Topical Daily   enoxaparin (LOVENOX) injection  30 mg Subcutaneous Q24H   feeding supplement  237 mL Oral BID BM   FLUoxetine  40 mg Oral Daily   guaiFENesin  600 mg Oral BID   levothyroxine  100 mcg Oral Q0600   methylPREDNISolone (SOLU-MEDROL) injection  40 mg Intravenous Daily   mirabegron ER  50 mg Oral Daily   mirtazapine  15 mg Oral QHS   pantoprazole  40 mg Oral Daily   revefenacin  175 mcg Nebulization Daily   tamsulosin  0.4 mg Oral Daily   temazepam  15 mg Oral QHS   Continuous Infusions:  ceFEPime (MAXIPIME) IV Stopped (09/16/22 2141)   lactated ringers 150 mL/hr at 09/17/22 0533   [START ON 09/18/2022] vancomycin       LOS: 1 day    Time spent: over 30 min    Lacretia Nicks, MD [facility]   To contact the attending provider between 7A-7P or the covering provider during after hours 7P-7A, please log into the web site [email or web address] and access using universal [place] password for that web site. If you do not have the password, please call the hospital operator.  09/17/2022, 9:09 AM

## 2022-09-17 NOTE — Progress Notes (Signed)
PHARMACY - PHYSICIAN COMMUNICATION CRITICAL VALUE ALERT - BLOOD CULTURE IDENTIFICATION (BCID)  Kayla [name] is an 72 y.o. female who presented to [facility] from Pulmonology clinic on 09/16/2022 with a chief complaint of acute hypoxic respiratory failure.    Assessment: Recent/recurrent Strep pneumo bacteremia. Previous micro: 5/28 BAL: Streptococcus pneumoniae (CTX MIC 1, PCN MIC 1) 6/9 BCx: Streptococcus pneumoniae  (CTX MIC 1, PCN MIC 1) 6/9 UCx: 20k colonies Pseudomonas aeruginosa 6/9 Resp panel: +Rhinovirus / Enterovirus Previous abx:  6/9 Zosyn >> 6/11 6/9 Azith >> 6/10 6/11 Ceftriaxone >> 6/13 6/13 Cefuroxime 500mg  BID x10 days at discharge (Didn't complete full course outpatient.  Stopped after 8 days)   Name of physician (or Provider) Contacted: Dr. Lowell Guitar  Current antibiotics: Cefepime and Vancomycin   Changes to prescribed antibiotics recommended:  Recommendations accepted by provider Change Cefepime to Ceftriaxone 2g IV q24h Continue Vancomycin until sensitivities return.      Results for orders placed or performed during the hospital encounter of 09/16/22  Blood Culture ID Panel (Reflexed) (Collected: 09/16/2022  2:08 PM)  Result Value Ref Range   Enterococcus faecalis NOT DETECTED NOT DETECTED   Enterococcus Faecium NOT DETECTED NOT DETECTED   Listeria monocytogenes NOT DETECTED NOT DETECTED   Staphylococcus species NOT DETECTED NOT DETECTED   Staphylococcus aureus (BCID) NOT DETECTED NOT DETECTED   Staphylococcus epidermidis NOT DETECTED NOT DETECTED   Staphylococcus lugdunensis NOT DETECTED NOT DETECTED   Streptococcus species DETECTED (A) NOT DETECTED   Streptococcus agalactiae NOT DETECTED NOT DETECTED   Streptococcus pneumoniae DETECTED (A) NOT DETECTED   Streptococcus pyogenes NOT DETECTED NOT DETECTED   A.calcoaceticus-baumannii NOT DETECTED NOT DETECTED   Bacteroides fragilis NOT DETECTED NOT DETECTED   Enterobacterales NOT DETECTED NOT DETECTED    Enterobacter cloacae complex NOT DETECTED NOT DETECTED   Escherichia coli NOT DETECTED NOT DETECTED   Klebsiella aerogenes NOT DETECTED NOT DETECTED   Klebsiella oxytoca NOT DETECTED NOT DETECTED   Klebsiella pneumoniae NOT DETECTED NOT DETECTED   Proteus species NOT DETECTED NOT DETECTED   Salmonella species NOT DETECTED NOT DETECTED   Serratia marcescens NOT DETECTED NOT DETECTED   Haemophilus influenzae NOT DETECTED NOT DETECTED   Neisseria meningitidis NOT DETECTED NOT DETECTED   Pseudomonas aeruginosa NOT DETECTED NOT DETECTED   Stenotrophomonas maltophilia NOT DETECTED NOT DETECTED   Candida albicans NOT DETECTED NOT DETECTED   Candida auris NOT DETECTED NOT DETECTED   Candida glabrata NOT DETECTED NOT DETECTED   Candida krusei NOT DETECTED NOT DETECTED   Candida parapsilosis NOT DETECTED NOT DETECTED   Candida tropicalis NOT DETECTED NOT DETECTED   Cryptococcus neoformans/gattii NOT DETECTED NOT DETECTED    Lynann Beaver PharmD, BCPS [facility] main pharmacy [phone] 09/17/2022 10:36 AM

## 2022-09-17 NOTE — Plan of Care (Signed)
  Problem: Education: Goal: Knowledge of General Education information will improve Description Including pain rating scale, medication(s)/side effects and non-pharmacologic comfort measures Outcome: Progressing   Problem: Health Behavior/Discharge Planning: Goal: Ability to manage health-related needs will improve Outcome: Progressing   

## 2022-09-17 NOTE — Consult Note (Addendum)
NAME:  Kayla [name], MRN:  [id number], DOB:  31-May-1950, LOS: 1 ADMISSION DATE:  09/16/2022, CONSULTATION DATE:  09/17/22  REFERRING MD:  Dr Rosalia Hammers, MD , CHIEF COMPLAINT:  SOB   History of Present Illness:  Kayla [name] is a 72 year old female with PMH notable for Multiple sclerosis, hiatal hernia, bilateral vocal cord immobility, pneumonia, immunodeficiency on IVIG, hypothyroidism, urine retention, GERD, cervical cancer, depression, insomnia. Pt is notably followed by Dr. Marchelle Gearing in the pulmonary clinic for bronchiectasis and astham with chronic cough. Was found to have worsening LLL infiltrate at visit on 08/12/22. Pt has bronchoscopy with bilateral washings of the mainstem bronchi, BAL of Lt and Rt lower lobes. BAL was positive for pneumococcus and treated with Augmentin. Pt had a history of immunodeficiency on IVIG, but this was stopped early in 2024. She presented to [facility] ER on 6/09 with progressive dyspnea and wheezing. Pt was admitted for aspiration PNA and specifically RLL with multifocal mucus plugging. RVP positive for rhinovirus. Cultures were positive and PCCM was consulted to assist with respiratory management. Pt was treated with ABX, steroids, mucolytic's and chest physiotherapy. She was subsequently discharged on 09/04/2022 and advised to follow up with her PCP. Pt states she followed up with PCP last week and stopped her Ceftin ABX therapy on day 8 of a 10 day course due to some adverse reactions secondary to Ceftin- as stated by pt. She has Immunodeficiency on IVIG and was receiving IVIG 11/2020- stopped 05/2022. Pt stopped her infusions for an unknown reason at that time. Since then she has had recurrent pneumonias. She also had pseudomonas in urine culture this admission which is thought to be colonization.  Today 09/16/2022 pt arrives back to pulmonolgy for follow up. Upon arrival she was in respiratory distress. Saturations were 81% on RA and she has audible wheezing accompanied with  coughing. Pt was noted to be grasping for air at that time utilizing accessory muscles, nasal flaring, and sternal retractions noted. She was not discharged with oxygen, and her sats have been very low (in the 70's) for 24-48 hours per her husband who is also at bedside. They have not been using albuterol nebulizers. Pt was placed on 4L with initial adequate response in SPO2 however required another bump to 6L to maintain saturations 96-100%.   PCCM was consulted for ongoing treatment and assistance of management with above issues.  Pertinent  Medical History  Multiple sclerosis, hiatal hernia, bilateral vocal cord immobility, pneumonia, immunodeficiency on IVIG, hypothyroidism, urine retention, GERD, cervical cancer, depression, insomnia.  Significant Hospital Events: Including procedures, antibiotic start and stop dates in addition to other pertinent events   6/09- admit 6/10 speech therapy-- normal swallow assessment 6/11- palliative care consulted; itching? From zosyn-- changed to ceftriaxone 6/13- pt discharged not on home o2- sent with px for Ceftin 10 day course (completed 8 days) with no improvement in symptoms. 6/25- re-admit to hospital with hypoxic RF and SOB 6/26 -remains on 4L [place], Chest percussion therapy, nebulizers as listed in POC. Leukocytosis improved to 23.7, afebrile, procal (-), BC growing gram (+) Cocci. Remains on solumedrol, cefepime and vancomycin- states subjective mild improvement overnight  Interim History / Subjective:  As above  Objective   Blood pressure (!) 103/49, pulse 73, temperature 98 F (36.7 C), temperature source Oral, resp. rate 14, height 5\' 3"  (1.6 m), weight 44.8 kg, SpO2 100 %.        Intake/Output Summary (Last 24 hours) at 09/17/2022 8295 Last data filed  at 09/17/2022 0400 Gross per 24 hour  Intake 2047.93 ml  Output 1000 ml  Net 1047.93 ml   Filed Weights   09/16/22 1305 09/16/22 1811  Weight: 42.6 kg 44.8 kg    Examination:    Physical Exam Constitutional:      General: She is in acute distress.     Appearance: She is ill-appearing.  HENT:     Head: Normocephalic.     Mouth/Throat:     Mouth: Mucous membranes are moist.  Eyes:     Pupils: Pupils are equal, round, and reactive to light.  Cardiovascular:     Rate and Rhythm: Normal rate and regular rhythm.  Pulmonary:     Effort: Tachypnea, accessory muscle usage and respiratory distress present.     Breath sounds: Decreased breath sounds present.     Comments: On 6L [place] Musculoskeletal:        General: Normal range of motion.     Cervical back: Normal range of motion.  Skin:    General: Skin is warm.     Capillary Refill: Capillary refill takes less than 2 seconds.  Neurological:     General: No focal deficit present.     Mental Status: She is alert and oriented to person, place, and time.  Psychiatric:        Behavior: Behavior is agitated.     Resolved Hospital Problem list   N/A  Assessment & Plan:  Acute Hypoxic Respiratory Failure Recent Bilateral lower love CAP requiring hospitalization 6/9-6/13/2024 (with streptococcus pneumonia and rhinovirus) Leukocytosis secondary to above Acute exacerbation of bronchiectasis and asthma Immunodeficiency- IVIG therapy stopped 06/15/2022 MS Bilateral vocal cord immobility with small trans-glotted airway Please see notes in EMR and care everywhere for full details. On my interview and examination pt oxygen saturation 96-100$% on 6L [place] however has accessory muscle use and diminished lung sounds with nonproductive cough. Pt states she has not previously required home oxygen however would like to be re-tested for oxygen need at discharge. Recently finished 8 days of Ceftin ABX therapy.  Repeat CXR shows: IMPRESSION: 1. Bilateral lower lobe hazy lung opacities, right worse than the left with right basilar atelectasis, suggesting ongoing infectious/inflammatory process. 2. Small bilateral pleural  effusion.  -remains afebrile, leukocytosis improving from 40 on admission to 23.7, remains afebrile, continue to monitor -Blood and urine cultures pending- growing gram + Cocci (notably had strep pneumoniae on previous blood cultures and Psuedomonas aeruginosa on previous Urine culture) -Repeat sputum culture if able to produce sputum -Procal (-) -IV ABX therapy with Cefepime and Vancomycin, pharmacy to manage Vanc -Chest percussion therapy -Solumedrol steroid therapy 40mg  IV daily -Mucinex BID as mucolytic -Albuterol, Brovana, Pulmicort, Yupelri nebulizer treatments -ECHO pending -Titrate oxygen as needed to maintain SPO2 saturation > 92% at all times -Will more than likely require supplemental oxygen at discharge -Will need to arrange for chest percussion vest as an outpatient  Unfortunately this is a patient with multiple co-morbidities and chronic illness including immunodeficiency no longer on IVIG, presenting as a re-admission for acute hypoxic RF as listed above. Overall prognosis is poor.    Hyponatremia- resolved Dehydration Pt states that she has had limited oral intake for the last several weeks. Serum sodium notably 129 on this admission, was 135 at time of discharge 6/13. Likely secondary to dehydration. -IVF given in the ED and we will continue to monitor serum sodium with AM labs    Best Practice (right click and "Reselect all SmartList Selections" daily)  Diet/type: Regular consistency (see orders) DVT prophylaxis: other GI prophylaxis: N/A Lines: N/A Foley:  N/A Code Status:  full code Last date of multidisciplinary goals of care discussion [09/17/22 ]  Labs   CBC: Recent Labs  Lab 09/16/22 1307 09/17/22 0325  WBC 40.0* 23.7*  NEUTROABS 36.8*  --   HGB 10.4* 9.0*  HCT 32.6* 30.7*  MCV 90.1 96.5  PLT 815* 622*     Basic Metabolic Panel: Recent Labs  Lab 09/16/22 1307 09/17/22 0325  NA 129* 136  K 3.9 4.3  CL 92* 105  CO2 24 22  GLUCOSE 127*  168*  BUN 12 14  CREATININE 0.74 0.53  CALCIUM 9.5 9.1  MG  --  1.7    GFR: Estimated Creatinine Clearance: 46.3 mL/min (by C-G formula based on SCr of 0.53 mg/dL). Recent Labs  Lab 09/16/22 1307 09/16/22 1312 09/16/22 1918 09/17/22 0325  PROCALCITON  --   --  0.41  --   WBC 40.0*  --   --  23.7*  LATICACIDVEN  --  1.4  --   --      Liver Function Tests: Recent Labs  Lab 09/16/22 1307  AST 19  ALT 17  ALKPHOS 109  BILITOT 0.5  PROT 6.8  ALBUMIN 2.8*    No results for input(s): "LIPASE", "AMYLASE" in the last 168 hours. No results for input(s): "AMMONIA" in the last 168 hours.  ABG    Component Value Date/Time   PHART 7.450 09/29/2020 0445   PCO2ART 35.4 09/29/2020 0445   PO2ART 77.6 (L) 09/29/2020 0445   HCO3 27.9 09/16/2022 1312   TCO2 26 01/07/2017 1617   ACIDBASEDEF 5.3 (H) 02/24/2013 1625   O2SAT 70.8 09/16/2022 1312     Coagulation Profile: No results for input(s): "INR", "PROTIME" in the last 168 hours.  Cardiac Enzymes: No results for input(s): "CKTOTAL", "CKMB", "CKMBINDEX", "TROPONINI" in the last 168 hours.  HbA1C: Hgb A1c MFr Bld  Date/Time Value Ref Range Status  02/25/2013 08:35 AM 5.6 <5.7 % Final    Comment:    (NOTE)                                                                       According to the ADA Clinical Practice Recommendations for 2011, when HbA1c is used as a screening test:  >=6.5%   Diagnostic of Diabetes Mellitus           (if abnormal result is confirmed) 5.7-6.4%   Increased risk of developing Diabetes Mellitus References:Diagnosis and Classification of Diabetes Mellitus,Diabetes Care,2011,34(Suppl 1):S62-S69 and Standards of Medical Care in         Diabetes - 2011,Diabetes Care,2011,34 (Suppl 1):S11-S61.    CBG: No results for input(s): "GLUCAP" in the last 168 hours.  Review of Systems:    Please see the history of present illness. All other systems reviewed and are negative    Past Medical History:   She,  has a past medical history of Arthritis, Asthma, Bilateral vocal cord paralysis, Bronchiectasis (HCC), Cervical cancer (HCC) (2005), Depression, GERD (gastroesophageal reflux disease), Hiatal hernia, Hypothyroidism, Immunodeficiency (HCC), Insomnia, Multiple sclerosis (HCC) (1996), Osteoporosis, Pneumonia (2024, 2014, 1992), and Urine retention.   Surgical History:   Past Surgical History:  Procedure  Laterality Date   ABDOMINAL HYSTERECTOMY  03/25/2003   for cervical cancer, complete   BRONCHIAL BIOPSY  10/26/2020   Procedure: BRONCHIAL BIOPSIES;  Surgeon: Josephine Igo, DO;  Location: [facility];  Service: Pulmonary;;   BRONCHIAL BRUSHINGS  10/26/2020   Procedure: BRONCHIAL BRUSHINGS;  Surgeon: Josephine Igo, DO;  Location: [facility];  Service: Pulmonary;;   BRONCHIAL WASHINGS  10/26/2020   Procedure: BRONCHIAL WASHINGS;  Surgeon: Josephine Igo, DO;  Location: [facility];  Service: Pulmonary;;   BRONCHIAL WASHINGS  08/19/2022   Procedure: BRONCHIAL WASHINGS;  Surgeon: Josephine Igo, DO;  Location: [facility];  Service: Cardiopulmonary;;   CATARACT EXTRACTION, BILATERAL Bilateral    lens for cataracts   CONVERSION TO TOTAL HIP Right 09/18/2015   Procedure: CONVERSION OF RIGHT HEMI ARTHROPLASTY TO TOTAL HIP ARTHROPLASTY ACETABULAR REVISON ;  Surgeon: Durene Romans, MD;  Location: [facility];  Service: Orthopedics;  Laterality: Right;   HIP ARTHROPLASTY Right 03/01/2013   Procedure: ARTHROPLASTY BIPOLAR HIP;  Surgeon: Shelda Pal, MD;  Location: [facility];  Service: Orthopedics;  Laterality: Right;   HIP CLOSED REDUCTION Right 03/12/2013   Procedure: CLOSED REDUCTION HIP;  Surgeon: [name], MD;  Location: [facility];  Service: Orthopedics;  Laterality: Right;   HIP CLOSED REDUCTION Right 03/19/2013   Procedure: CLOSED REDUCTION HIP;  Surgeon: Javier Docker, MD;  Location: [facility];  Service: Orthopedics;  Laterality: Right;   HIP CLOSED REDUCTION Right 11/10/2015    Procedure: CLOSED MANIPULATION HIP;  Surgeon: Samson Frederic, MD;  Location: [facility];  Service: Orthopedics;  Laterality: Right;   HIP CLOSED REDUCTION Right 06/05/2017   Procedure: CLOSED REDUCTION HIP;  Surgeon: Ranee Gosselin, MD;  Location: [facility];  Service: Orthopedics;  Laterality: Right;   SMALL INTESTINE SURGERY     TOTAL HIP REVISION Right 06/06/2017   Procedure: TOTAL HIP REVISION;  Surgeon: Ranee Gosselin, MD;  Location: [facility];  Service: Orthopedics;  Laterality: Right;   VESICOVAGINAL FISTULA CLOSURE W/ TAH  03/25/2003   VIDEO BRONCHOSCOPY N/A 10/26/2020   Procedure: VIDEO BRONCHOSCOPY WITH FLUORO;  Surgeon: Josephine Igo, DO;  Location: [facility];  Service: Pulmonary;  Laterality: N/A;   VIDEO BRONCHOSCOPY Left 08/19/2022   Procedure: VIDEO BRONCHOSCOPY WITH FLUORO; WITH BAL;  Surgeon: Josephine Igo, DO;  Location: [facility];  Service: Cardiopulmonary;  Laterality: Left;     Social History:   reports that she has never smoked. She has never used smokeless tobacco. She reports that she does not currently use alcohol after a past usage of about 2.0 standard drinks of alcohol per week. She reports that she does not use drugs.   Family History:  Her family history includes Atrial fibrillation in her brother; Colon cancer in her father; Heart attack in her maternal grandfather; Other in her mother; Parkinson's disease in her paternal grandfather. There is no history of Esophageal cancer, Stomach cancer, or Rectal cancer.   Allergies Allergies  Allergen Reactions   Tramadol     hallucinations    Contrast Media [Iodinated Contrast Media] Rash   Iohexol Hives   Morphine And Codeine Other (See Comments)    Reaction:  Hallucinations    Methylprednisolone Other (See Comments)    Reaction:  Decreases pts heart rate    Hydrocodone Itching   Oxycodone-Acetaminophen Rash and Other (See Comments)    Reaction:  Hallucinations    Phenytoin Sodium Extended Rash   Zanaflex  [Tizanidine Hydrochloride] Rash     Home  Medications  Prior to Admission medications   Medication Sig Start Date End Date Taking? Authorizing Provider  albuterol (VENTOLIN HFA) 108 (90 Base) MCG/ACT inhaler INHALE 2 PUFFS INTO LUNGS EVERY 6 HOURS AS NEEDED. Patient taking differently: Inhale 1 puff into the lungs every 6 (six) hours as needed for shortness of breath. 08/29/22   Kalman Shan, MD  calcium carbonate (OS-CAL - DOSED IN MG OF ELEMENTAL CALCIUM) 1250 (500 Ca) MG tablet Take 1 tablet (1,250 mg total) by mouth daily with breakfast. 06/22/22   Arnetha Courser, MD  Coenzyme Q10 (VITALINE COQ10) 300 MG WAFR Take 300 mg by mouth in the morning.    [provider]  dexlansoprazole (DEXILANT) 60 MG capsule TAKE 1 CAPSULE BY MOUTH  ONCE DAILY WITH BREAKFAST 05/12/22   [name], MD  docusate sodium (COLACE) 100 MG capsule Take 1 capsule (100 mg total) by mouth in the morning and at bedtime. 09/04/22   Glade Lloyd, MD  dronabinol (MARINOL) 2.5 MG capsule Take 1 capsule (2.5 mg total) by mouth 2 (two) times daily before lunch and supper. 07/08/22   [name], MD  famotidine (PEPCID) 20 MG tablet One after supper Patient taking differently: Take 20 mg by mouth at bedtime. One after supper 06/07/20   [name], MD  Ferric Maltol (ACCRUFER) 30 MG CAPS Take 1 capsule (30 mg total) by mouth in the morning and at bedtime. 09/09/22   [name], MD  FLUoxetine (PROZAC) 40 MG capsule Take 1 capsule (40 mg total) by mouth daily. 04/09/22   [name], MD  Fluticasone-Umeclidin-Vilant (TRELEGY ELLIPTA) 100-62.5-25 MCG/ACT AEPB Inhale 1 puff into the lungs daily. 05/26/22   [name], MD  guaiFENesin (MUCINEX) 600 MG 12 hr tablet Take 1 tablet (600 mg total) by mouth 2 (two) times daily. 09/04/22   Glade Lloyd, MD  levothyroxine (SYNTHROID) 100 MCG tablet TAKE 1 TABLET BY MOUTH ONCE  DAILY BEFORE BREAKFAST Patient taking differently: Take 100 mcg by mouth daily before  breakfast. 12/31/21   [name], MD  methenamine (HIPREX) 1 g tablet Take 1 tablet (1 g total) by mouth 2 (two) times daily with a meal. 08/06/22   [name], MD  mirabegron ER (MYRBETRIQ) 25 MG TB24 tablet Take 1 tablet (25 mg total) by mouth daily. 09/04/22   Glade Lloyd, MD  mirtazapine (REMERON) 15 MG tablet Take 1 tablet (15 mg total) by mouth at bedtime. 09/09/22   [name], MD  Multiple Vitamin (MULTIVITAMIN WITH MINERALS) TABS tablet Take 1 tablet by mouth in the morning.    [provider]  Probiotic Product (PROBIOTIC PO) Take 1 capsule by mouth daily.    [provider]  Respiratory Therapy Supplies (FLUTTER) DEVI Use as directed 06/02/17   [name], MD  sodium chloride HYPERTONIC 3 % nebulizer solution Take by nebulization 2 (two) times daily as needed for cough. Dx J44.9 08/29/22   Kalman Shan, MD  tamsulosin (FLOMAX) 0.4 MG CAPS capsule Take 0.4 mg by mouth daily.    [provider]  temazepam (RESTORIL) 15 MG capsule Take 1 capsule (15 mg total) by mouth at bedtime. 07/11/22   [name], MD  torsemide (DEMADEX) 10 MG tablet Take 1 tablet (10 mg total) by mouth daily. 07/08/22   [name], MD  trimethoprim (TRIMPEX) 100 MG tablet Take 100 mg by mouth daily. 11/02/21   [provider]     Critical care  time: 65 minutes     Janeece Riggers, AGACNP-BC [facility] For pager details, please see AMION or use EPIC chat After 1900, please call [facility] for cross coverage needs 09/17/2022 8:42 AM

## 2022-09-17 NOTE — Progress Notes (Signed)
Initial Nutrition Assessment  DOCUMENTATION CODES:   Underweight  INTERVENTION:  - Increase to Ensure Enlive to po TID, each supplement provides 350 kcal and 20 grams of protein.  - Add MVI q day.  NUTRITION DIAGNOSIS:   Inadequate oral intake related to poor appetite as evidenced by meal completion < 50%.  GOAL:   Patient will meet greater than or equal to 90% of their needs  MONITOR:   PO intake, Supplement acceptance  REASON FOR ASSESSMENT:   Malnutrition Screening Tool    ASSESSMENT:   72 y.o. female admits related to SOB. PMH includes: GERD, hiatal hernia, hypothyroidism, insomnia, multiple sclerosis, osteoporosis. Pt is currently receiving medical management related to acute hypoxic respiratory failure.  Meds reviewed: solu-medrol, remeron. Labs reviewed: WDL.   RD attempted to call pt's room but no answer. No intakes noted  per record. Pt has Ensure BID already ordered and has been accepting them per Meds Hx record. Will check in with RN to see how pt has been doing with meals. RD will continue to closely monitor PO intakes.   NUTRITION - FOCUSED PHYSICAL EXAM:  Remote assessment.  Diet Order:   Diet Order             Diet regular Room service appropriate? Yes; Fluid consistency: Thin  Diet effective now                   EDUCATION NEEDS:   Not appropriate for education at this time  Skin:  Skin Assessment: Reviewed RN Assessment  Last BM:  PTA  Height:   Ht Readings from Last 1 Encounters:  09/16/22 5\' 3"  (1.6 m)    Weight:   Wt Readings from Last 1 Encounters:  09/18/22 44.4 kg    Ideal Body Weight:     BMI:  Body mass index is 17.34 kg/m.  Estimated Nutritional Needs:   Kcal:  1600-1800 kcals  Protein:  65-80 gm  Fluid:  >/= 1.6 L  Bethann Humble, RD, LDN, CNSC.

## 2022-09-18 ENCOUNTER — Encounter (HOSPITAL_COMMUNITY): Payer: Self-pay | Admitting: Internal Medicine

## 2022-09-18 DIAGNOSIS — J189 Pneumonia, unspecified organism: Secondary | ICD-10-CM | POA: Diagnosis not present

## 2022-09-18 DIAGNOSIS — J9621 Acute and chronic respiratory failure with hypoxia: Secondary | ICD-10-CM

## 2022-09-18 DIAGNOSIS — A403 Sepsis due to Streptococcus pneumoniae: Secondary | ICD-10-CM

## 2022-09-18 DIAGNOSIS — G35 Multiple sclerosis: Secondary | ICD-10-CM

## 2022-09-18 LAB — BASIC METABOLIC PANEL
Anion gap: 9 (ref 5–15)
BUN: 13 mg/dL (ref 8–23)
CO2: 26 mmol/L (ref 22–32)
Calcium: 9.5 mg/dL (ref 8.9–10.3)
Chloride: 103 mmol/L (ref 98–111)
Creatinine, Ser: 0.52 mg/dL (ref 0.44–1.00)
GFR, Estimated: >60 mL/min (ref >60)
Glucose, Bld: 103 mg/dL — ABNORMAL HIGH (ref 70–99)
Potassium: 4 mmol/L (ref 3.5–5.1)
Sodium: 138 mmol/L (ref 135–145)

## 2022-09-18 LAB — CULTURE, BLOOD (SINGLE): Culture: NO GROWTH

## 2022-09-18 LAB — VITAMIN B12: Vitamin B-12: 1284 pg/mL — ABNORMAL HIGH (ref 180–914)

## 2022-09-18 LAB — CBC
HCT: 31.7 % — ABNORMAL LOW (ref 36.0–46.0)
Hemoglobin: 9.6 g/dL — ABNORMAL LOW (ref 12.0–15.0)
MCH: 28.5 pg (ref 26.0–34.0)
MCHC: 30.3 g/dL (ref 30.0–36.0)
MCV: 94.1 fL (ref 80.0–100.0)
Platelets: 753 10*3/uL — ABNORMAL HIGH (ref 150–400)
RBC: 3.37 MIL/uL — ABNORMAL LOW (ref 3.87–5.11)
RDW: 14.9 % (ref 11.5–15.5)
WBC: 23.4 10*3/uL — ABNORMAL HIGH (ref 4.0–10.5)
nRBC: 0 % (ref 0.0–0.2)

## 2022-09-18 LAB — MAGNESIUM: Magnesium: 1.7 mg/dL (ref 1.7–2.4)

## 2022-09-18 LAB — IRON AND TIBC
Iron: 104 ug/dL (ref 28–170)
Saturation Ratios: 61 % — ABNORMAL HIGH (ref 10.4–31.8)
TIBC: 171 ug/dL — ABNORMAL LOW (ref 250–450)
UIBC: 67 ug/dL

## 2022-09-18 LAB — FUNGAL ORGANISM REFLEX

## 2022-09-18 LAB — FUNGUS CULTURE RESULT

## 2022-09-18 LAB — FUNGUS CULTURE WITH STAIN

## 2022-09-18 LAB — FERRITIN: Ferritin: 229 ng/mL (ref 11–307)

## 2022-09-18 LAB — FOLATE: Folate: 10 ng/mL (ref >5.9)

## 2022-09-18 MED ORDER — ADULT MULTIVITAMIN W/MINERALS CH
1.0000 | ORAL_TABLET | Freq: Every day | ORAL | Status: DC
  Administered 2022-09-18 – 2022-09-24 (×6): 1 via ORAL
  Filled 2022-09-18 (×7): qty 1

## 2022-09-18 MED ORDER — ENSURE ENLIVE PO LIQD
237.0000 mL | Freq: Three times a day (TID) | ORAL | Status: DC
  Administered 2022-09-18 – 2022-09-24 (×5): 237 mL via ORAL

## 2022-09-18 NOTE — Progress Notes (Signed)
PROGRESS NOTE    Kayla [name]  ONG:[id number] DOB: Dec 16, 1950 DOA: 09/16/2022 PCP: [name], MD  Chief Complaint  Patient presents with   Shortness of Breath    Brief Narrative:   Kayla [name] is Barton Want 72 y.o. female with medical history significant for multiple sclerosis, bilateral vocal cord immobility, immunodeficiency on IVIG, hypothyroidism, GERD, recently admitted to [facility] from 6/9 to 6/13 for aspiration pneumonia, right lower lobe multifocal mucous plugging, rhinovirus positive and also found to have strep pneumo bacteremia.  She was treated in the hospital, weaned to room air, and discharged home to complete 10 more days of p.o. Ceftin.  Patient states she did not really improve after leaving the hospital.  She was having some side effects, so cefdinir was stopped on day 8 by her PCP.  She went to her outpatient pulmonology follow-up today, was noted to have sats of 84%, EMS was called, given her bronchodilator and brought to the emergency department.   ED Course: Patient saturating 99% on 4 L nasal cannula on arrival to the ER, tachycardic heart rate 102.  Blood pressure 113/51.  Lab work shows WBC 40 K, hemoglobin 10 and stable, platelets 815.  Hyponatremia 129, electrolytes and renal function at normal baseline.  Chest x-ray demonstrates small bilateral pleural effusion, bilateral pneumonia right sided consolidation slightly worse than the left.  Patient was given empiric IV vancomycin, and IV cefepime, started on IV fluids, and hospitalist was contacted for admission.  Pulmonology was also consulted prior to hospitalist.  Assessment & Plan:   Active Problems:   Acute on chronic respiratory failure with hypoxia (HCC)   HCAP (healthcare-associated pneumonia)  [name] is Kayla [name] 72 y.o. female with medical history significant for multiple sclerosis, bilateral vocal cord immobility, immunodeficiency on IVIG, hypothyroidism, GERD, recent hospital stay for strep pneumo  bacteremia and pneumonia being admitted to the hospital with continued and worsening hypoxic respiratory failure and healthcare acquired pneumonia.   Acute hypoxic respiratory failure  Community Acquired Pneumonia Recurrent vs Nonresolving Pneumonia Rhinovirus Infection  -pulmonary c/s, appreciate recs --RVP with rhinovirus (this was present 2 weeks ago, unclear significance) --sputum cx, urine strep, urine leginella -- strep pneumoniae bacteremia (see below) - wean O2 as tolerated -- broad spectrum abx (narrow as appropriate)  -- scheduled and prn nebs -- flutter valve -- strict I/O, daily weights    Strep Pneumoniae Bacteremia  Recent Strep Pneumo Bacteremia Recent strep pneumo bacteremia, concerning for recurrent or inadequately treated bacteremia Strep pneumoniae bacteremia 6/25, susceptibilities pending Appreciate ID consult  Echo with normal EF, grade 1 diastolic dysfunction, no evidence of valvular vegetations  Hyponatremia-resolved, follow    Depression-Prozac, remeron   GERD-Protonix p.o.   Hypothyroidism-Synthroid 100 mcg p.o. daily   History of immune deficiency, with IVIG therapy- due to MS immunomodulatory therapy.  Followed by Dr. Livingston Diones with All[facility] and [facility]ology in [place], [place].  has been off of IVIG since hospitalization for pubic ramus fracture in March.   Needs to reestablish outpatient     Anemia- gradual downtrend over last few months Iron (wnl), folate (wnl), b12 (elevated), ferritin (wnl) -> suggestive of chronic disease, will continue to trend   Insomnia- restoril, remeron at bedtime  Overactive Bladder Myrbetriq  Multiple Sclerosis Followed by neurologist in [place], Dr. Konrad Felix per previous note  Bilateral Vocal Cord Immobility with Small Transglottal Airway Followed by Dr. Harriette Ohara with ENT at [facility]    DVT prophylaxis: lovenox Code Status: full  Family Communication: none Disposition:    Status is: Inpatient Remains inpatient appropriate because: need for continued inpatient care   Consultants:  PCCM  Procedures:  none  Antimicrobials:  Anti-infectives (From admission, onward)    Start     Dose/Rate Route Frequency Ordered Stop   09/18/22 0400  vancomycin (VANCOCIN) IVPB 1000 mg/200 mL premix        1,000 mg 200 mL/hr over 60 Minutes Intravenous Every 36 hours 09/16/22 1809     09/17/22 2200  cefTRIAXone (ROCEPHIN) 2 g in sodium chloride 0.9 % 100 mL IVPB        2 g 200 mL/hr over 30 Minutes Intravenous Every 24 hours 09/17/22 1047     09/17/22 0000  ceFEPIme (MAXIPIME) 2 g in sodium chloride 0.9 % 100 mL IVPB  Status:  Discontinued        2 g 200 mL/hr over 30 Minutes Intravenous Every 24 hours 09/16/22 1638 09/16/22 1639   09/17/22 0000  ceFEPIme (MAXIPIME) 500 mg in dextrose 5 % 50 mL IVPB  Status:  Discontinued        500 mg 110 mL/hr over 30 Minutes Intravenous Every 24 hours 09/16/22 1639 09/16/22 1800   09/16/22 2200  ceFEPIme (MAXIPIME) 2 g in sodium chloride 0.9 % 100 mL IVPB  Status:  Discontinued        2 g 200 mL/hr over 30 Minutes Intravenous Every 12 hours 09/16/22 1802 09/17/22 1047   09/16/22 1500  vancomycin (VANCOREADY) IVPB 750 mg/150 mL        750 mg 150 mL/hr over 60 Minutes Intravenous  Once 09/16/22 1447 09/16/22 1638   09/16/22 1500  ceFEPIme (MAXIPIME) 2 g in sodium chloride 0.9 % 100 mL IVPB        2 g 200 mL/hr over 30 Minutes Intravenous  Once 09/16/22 1451 09/16/22 1549   09/16/22 1445  ceFEPIme (MAXIPIME) 1 g in sodium chloride 0.9 % 100 mL IVPB  Status:  Discontinued        1 g 200 mL/hr over 30 Minutes Intravenous  Once 09/16/22 1438 09/16/22 1452       Subjective: Continues to feel improved  Objective: Vitals:   09/18/22 0815 09/18/22 0817 09/18/22 0905 09/18/22 1210  BP:      Pulse:      Resp:      Temp:   97.7 F (36.5 C) 97.6 F (36.4 C)  TempSrc:   Oral Axillary  SpO2: 97% 95%    Weight:      Height:         Intake/Output Summary (Last 24 hours) at 09/18/2022 1339 Last data filed at 09/18/2022 0612 Gross per 24 hour  Intake 361.64 ml  Output 1675 ml  Net -1313.36 ml   Filed Weights   09/16/22 1305 09/16/22 1811 09/18/22 0500  Weight: 42.6 kg 44.8 kg 44.4 kg    Examination:  General: No acute distress. Cardiovascular: RRR Lungs: diminished Abdomen: Soft, nontender, nondistended  Neurological: Alert and oriented 3. Moves all extremities 4 with equal strength. Cranial nerves II through XII grossly intact. Extremities: No clubbing or cyanosis. No edema.   Data Reviewed: I have personally reviewed following labs and imaging studies  CBC: Recent Labs  Lab 09/16/22 1307 09/17/22 0325 09/18/22 0319  WBC 40.0* 23.7* 23.4*  NEUTROABS 36.8*  --   --   HGB 10.4* 9.0* 9.6*  HCT 32.6* 30.7* 31.7*  MCV 90.1 96.5 94.1  PLT 815* 622* 753*    Basic  Metabolic Panel: Recent Labs  Lab 09/16/22 1307 09/17/22 0325 09/18/22 0319  NA 129* 136 138  K 3.9 4.3 4.0  CL 92* 105 103  CO2 24 22 26   GLUCOSE 127* 168* 103*  BUN 12 14 13   CREATININE 0.74 0.53 0.52  CALCIUM 9.5 9.1 9.5  MG  --  1.7 1.7    GFR: Estimated Creatinine Clearance: 45.9 mL/min (by C-G formula based on SCr of 0.52 mg/dL).  Liver Function Tests: Recent Labs  Lab 09/16/22 1307  AST 19  ALT 17  ALKPHOS 109  BILITOT 0.5  PROT 6.8  ALBUMIN 2.8*    CBG: No results for input(s): "GLUCAP" in the last 168 hours.   Recent Results (from the past 240 hour(s))  Culture, blood (single)     Status: Abnormal (Preliminary result)   Collection Time: 09/16/22  2:08 PM   Specimen: BLOOD RIGHT FOREARM  Result Value Ref Range Status   Specimen Description   Final    BLOOD RIGHT FOREARM Performed at [facility], [address] [place], [place] [address]    Special Requests   Final    BOTTLES DRAWN AEROBIC AND ANAEROBIC Blood Culture adequate volume Performed at [facility],  [address] [place], [place] [address]    Culture  Setup Time   Final    GRAM POSITIVE COCCI IN BOTH AEROBIC AND ANAEROBIC BOTTLES CRITICAL RESULT CALLED TO, READ BACK BY AND VERIFIED WITH: PHARMD Earlean Shawl [phone] FCP    Culture (Kayden Amend)  Final    STREPTOCOCCUS PNEUMONIAE SUSCEPTIBILITIES TO FOLLOW Performed at [facility], [address] [place], [place] [address]    Report Status PENDING  Incomplete  Blood Culture ID Panel (Reflexed)     Status: Abnormal   Collection Time: 09/16/22  2:08 PM  Result Value Ref Range Status   Enterococcus faecalis NOT DETECTED NOT DETECTED Final   Enterococcus Faecium NOT DETECTED NOT DETECTED Final   Listeria monocytogenes NOT DETECTED NOT DETECTED Final   Staphylococcus species NOT DETECTED NOT DETECTED Final   Staphylococcus aureus (BCID) NOT DETECTED NOT DETECTED Final   Staphylococcus epidermidis NOT DETECTED NOT DETECTED Final   Staphylococcus lugdunensis NOT DETECTED NOT DETECTED Final   Streptococcus species DETECTED (Alishba Naples) NOT DETECTED Final    Comment: CRITICAL RESULT CALLED TO, READ BACK BY AND VERIFIED WITH: PHARMD J. GADHIA [phone] FCP    Streptococcus agalactiae NOT DETECTED NOT DETECTED Final   Streptococcus pneumoniae DETECTED (Macai Sisneros) NOT DETECTED Final    Comment: CRITICAL RESULT CALLED TO, READ BACK BY AND VERIFIED WITH: PHARMD J. Teodoro Kil [phone] FCP    Streptococcus pyogenes NOT DETECTED NOT DETECTED Final   Eisa Conaway.calcoaceticus-baumannii NOT DETECTED NOT DETECTED Final   Bacteroides fragilis NOT DETECTED NOT DETECTED Final   Enterobacterales NOT DETECTED NOT DETECTED Final   Enterobacter cloacae complex NOT DETECTED NOT DETECTED Final   Escherichia coli NOT DETECTED NOT DETECTED Final   Klebsiella aerogenes NOT DETECTED NOT DETECTED Final   Klebsiella oxytoca NOT DETECTED NOT DETECTED Final   Klebsiella pneumoniae NOT DETECTED NOT DETECTED Final   Proteus species NOT DETECTED NOT DETECTED Final   Salmonella species  NOT DETECTED NOT DETECTED Final   Serratia marcescens NOT DETECTED NOT DETECTED Final   Haemophilus influenzae NOT DETECTED NOT DETECTED Final   Neisseria meningitidis NOT DETECTED NOT DETECTED Final   Pseudomonas aeruginosa NOT DETECTED NOT DETECTED Final   Stenotrophomonas maltophilia NOT DETECTED NOT DETECTED Final   Candida  albicans NOT DETECTED NOT DETECTED Final   Candida auris NOT DETECTED NOT DETECTED Final   Candida glabrata NOT DETECTED NOT DETECTED Final   Candida krusei NOT DETECTED NOT DETECTED Final   Candida parapsilosis NOT DETECTED NOT DETECTED Final   Candida tropicalis NOT DETECTED NOT DETECTED Final   Cryptococcus neoformans/gattii NOT DETECTED NOT DETECTED Final    Comment: Performed at [facility], [address], [place], [place] [address]  MRSA Next Gen by PCR, Nasal     Status: None   Collection Time: 09/16/22  5:38 PM   Specimen: Nasal Mucosa; Nasal Swab  Result Value Ref Range Status   MRSA by PCR Next Gen NOT DETECTED NOT DETECTED Final    Comment: (NOTE) The GeneXpert MRSA Assay (FDA approved for NASAL specimens only), is one component of [employer] comprehensive MRSA colonization surveillance program. It is not intended to diagnose MRSA infection nor to guide or monitor treatment for MRSA infections. Test performance is not FDA approved in patients less than 58 years old. Performed at [facility], [address], [place], [place] [address]   Respiratory (~20 pathogens) panel by PCR     Status: Abnormal   Collection Time: 09/16/22  6:47 PM   Specimen: Nasopharyngeal Swab; Respiratory  Result Value Ref Range Status   Adenovirus NOT DETECTED NOT DETECTED Final   Coronavirus 229E NOT DETECTED NOT DETECTED Final    Comment: (NOTE) The Coronavirus on the Respiratory Panel, DOES NOT test for the novel  Coronavirus (2019 nCoV)    Coronavirus HKU1 NOT DETECTED NOT DETECTED Final   Coronavirus NL63 NOT DETECTED NOT DETECTED Final    Coronavirus OC43 NOT DETECTED NOT DETECTED Final   Metapneumovirus NOT DETECTED NOT DETECTED Final   Rhinovirus / Enterovirus DETECTED (Jobany Montellano) NOT DETECTED Final   Influenza Benn Tarver NOT DETECTED NOT DETECTED Final   Influenza B NOT DETECTED NOT DETECTED Final   Parainfluenza Virus 1 NOT DETECTED NOT DETECTED Final   Parainfluenza Virus 2 NOT DETECTED NOT DETECTED Final   Parainfluenza Virus 3 NOT DETECTED NOT DETECTED Final   Parainfluenza Virus 4 NOT DETECTED NOT DETECTED Final   Respiratory Syncytial Virus NOT DETECTED NOT DETECTED Final   Bordetella pertussis NOT DETECTED NOT DETECTED Final   Bordetella Parapertussis NOT DETECTED NOT DETECTED Final   Chlamydophila pneumoniae NOT DETECTED NOT DETECTED Final   Mycoplasma pneumoniae NOT DETECTED NOT DETECTED Final    Comment: Performed at [facility], [address] [place], [place] [address]  Culture, blood (single) w Reflex to ID Panel     Status: None (Preliminary result)   Collection Time: 09/16/22  7:14 PM   Specimen: BLOOD  Result Value Ref Range Status   Specimen Description   Final    BLOOD BLOOD RIGHT ARM Performed at [facility], [address] [place], [place] [address]    Special Requests   Final    BOTTLES DRAWN AEROBIC ONLY Blood Culture adequate volume Performed at [facility], [address], [place], [place] [address]    Culture   Final    NO GROWTH 2 DAYS Performed at [facility], [address] [place], [place] [address]    Report Status PENDING  Incomplete  Culture, blood (single) w Reflex to ID Panel     Status: None (Preliminary result)   Collection Time: 09/16/22  7:18 PM   Specimen: BLOOD  Result Value Ref Range Status   Specimen Description  Final    BLOOD BLOOD LEFT HAND Performed at [facility], [address] [place], [place] [address]    Special Requests   Final    BOTTLES DRAWN AEROBIC ONLY Blood Culture adequate  volume Performed at [facility], [address], [name], [place] [address]    Culture   Final    NO GROWTH 2 DAYS Performed at [facility], [address] [place], [place] [address]    Report Status PENDING  Incomplete         Radiology Studies: ECHOCARDIOGRAM COMPLETE  Result Date: 09/17/2022    ECHOCARDIOGRAM REPORT   Patient Name:   JENITA RAYFIELD Date of Exam: 09/17/2022 Medical Rec #:  [id number]       Height:       63.0 in Accession #:    [id number]      Weight:       98.8 lb Date of Birth:  08-16-1950        BSA:          1.433 m Patient Age:    72 years        BP:           97/78 mmHg Patient Gender: F               HR:           73 bpm. Exam Location:  Inpatient Procedure: 2D Echo, Cardiac Doppler and Color Doppler Indications:    Bacteremia  History:        Patient has prior history of Echocardiogram examinations, most                 recent 01/28/2022. COPD and MS, hx cancer,                 Signs/Symptoms:Shortness of Breath and Bacteremia; Risk                 Factors:Sleep Apnea.  Sonographer:    Wallie Char Referring Phys: [phone] Doreen Garretson CALDWELL POWELL JR  Sonographer Comments: Image acquisition challenging due to COPD and Challenging image acquisition due to pt coughing throughout entire exam. IMPRESSIONS  1. Left ventricular ejection fraction, by estimation, is 60 to 65%. Left ventricular ejection fraction by 2D MOD biplane is 63.1 %. The left ventricle has normal function. The left ventricle has no regional wall motion abnormalities. Left ventricular diastolic parameters are consistent with Grade I diastolic dysfunction (impaired relaxation).  2. Right ventricular systolic function is normal. The right ventricular size is normal. There is normal pulmonary artery systolic pressure. The estimated right ventricular systolic pressure is 33.0 mmHg.  3. The mitral valve is grossly normal. Trivial mitral valve regurgitation. No evidence of mitral stenosis.  4. The  aortic valve is tricuspid. Aortic valve regurgitation is not visualized. No aortic stenosis is present.  5. The inferior vena cava is normal in size with greater than 50% respiratory variability, suggesting right atrial pressure of 3 mmHg. Conclusion(s)/Recommendation(s): No evidence of valvular vegetations on this transthoracic echocardiogram. Consider Kailia Starry transesophageal echocardiogram to exclude infective endocarditis if clinically indicated. FINDINGS  Left Ventricle: Left ventricular ejection fraction, by estimation, is 60 to 65%. Left ventricular ejection fraction by 2D MOD biplane is 63.1 %. The left ventricle has normal function. The left ventricle has no regional wall motion abnormalities. The left ventricular internal cavity size was normal in size. There is no left ventricular hypertrophy. Left ventricular diastolic parameters are consistent with  Grade I diastolic dysfunction (impaired relaxation). Right Ventricle: The right ventricular size is normal. No increase in right ventricular wall thickness. Right ventricular systolic function is normal. There is normal pulmonary artery systolic pressure. The tricuspid regurgitant velocity is 2.74 m/s, and  with an assumed right atrial pressure of 3 mmHg, the estimated right ventricular systolic pressure is 33.0 mmHg. Left Atrium: Left atrial size was normal in size. Right Atrium: Right atrial size was normal in size. Pericardium: There is no evidence of pericardial effusion. Mitral Valve: The mitral valve is grossly normal. Trivial mitral valve regurgitation. No evidence of mitral valve stenosis. MV peak gradient, 4.7 mmHg. The mean mitral valve gradient is 2.0 mmHg. Tricuspid Valve: The tricuspid valve is grossly normal. Tricuspid valve regurgitation is mild . No evidence of tricuspid stenosis. Aortic Valve: The aortic valve is tricuspid. Aortic valve regurgitation is not visualized. No aortic stenosis is present. Aortic valve mean gradient measures 5.0 mmHg.  Aortic valve peak gradient measures 8.2 mmHg. Aortic valve area, by VTI measures 1.81 cm. Pulmonic Valve: The pulmonic valve was grossly normal. Pulmonic valve regurgitation is not visualized. No evidence of pulmonic stenosis. Aorta: The aortic root and ascending aorta are structurally normal, with no evidence of dilitation. Venous: The inferior vena cava is normal in size with greater than 50% respiratory variability, suggesting right atrial pressure of 3 mmHg. IAS/Shunts: The atrial septum is grossly normal.  LEFT VENTRICLE PLAX 2D                        Biplane EF (MOD) LVIDd:         3.80 cm         LV Biplane EF:   Left LVIDs:         2.70 cm                          ventricular LV PW:         0.80 cm                          ejection LV IVS:        0.80 cm                          fraction by LVOT diam:     1.70 cm                          2D MOD LV SV:         55                               biplane is LV SV Index:   38                               63.1 %. LVOT Area:     2.27 cm                                Diastology                                LV e' medial:  5.58 cm/s LV Volumes (MOD)               LV E/e' medial:  13.9 LV vol d, MOD    61.1 ml       LV e' lateral:   8.64 cm/s A2C:                           LV E/e' lateral: 9.0 LV vol d, MOD    54.0 ml A4C: LV vol s, MOD    23.4 ml A2C: LV vol s, MOD    20.2 ml A4C: LV SV MOD A2C:   37.7 ml LV SV MOD A4C:   54.0 ml LV SV MOD BP:    37.5 ml RIGHT VENTRICLE             IVC RV Basal diam:  3.20 cm     IVC diam: 2.00 cm RV S prime:     13.10 cm/s TAPSE (M-mode): 2.3 cm LEFT ATRIUM             Index        RIGHT ATRIUM           Index LA diam:        2.60 cm 1.81 cm/m   RA Area:     11.70 cm LA Vol (A2C):   24.0 ml 16.75 ml/m  RA Volume:   26.70 ml  18.64 ml/m LA Vol (A4C):   19.3 ml 13.47 ml/m LA Biplane Vol: 21.8 ml 15.22 ml/m  AORTIC VALVE AV Area (Vmax):    1.78 cm AV Area (Vmean):   1.70 cm AV Area (VTI):     1.81 cm AV Vmax:            143.50 cm/s AV Vmean:          106.100 cm/s AV VTI:            0.304 m AV Peak Grad:      8.2 mmHg AV Mean Grad:      5.0 mmHg LVOT Vmax:         112.50 cm/s LVOT Vmean:        79.450 cm/s LVOT VTI:          0.242 m LVOT/AV VTI ratio: 0.80  AORTA Ao Root diam: 2.90 cm Ao Asc diam:  3.20 cm MITRAL VALVE                TRICUSPID VALVE MV Area (PHT): 3.01 cm     TR Peak grad:   30.0 mmHg MV Area VTI:   2.12 cm     TR Vmax:        274.00 cm/s MV Peak grad:  4.7 mmHg MV Mean grad:  2.0 mmHg     SHUNTS MV Vmax:       1.08 m/s     Systemic VTI:  0.24 m MV Vmean:      59.8 cm/s    Systemic Diam: 1.70 cm MV Decel Time: 252 msec MV E velocity: 77.40 cm/s MV Makai Dumond velocity: 102.00 cm/s MV E/Kramer Hanrahan ratio:  0.76 Lennie Odor MD Electronically signed by Lennie Odor MD Signature Date/Time: 09/17/2022/4:16:58 PM    Final    DG Chest Port 1 View  Result Date: 09/16/2022 CLINICAL DATA:  Cough and dyspnea. EXAM: PORTABLE CHEST 1 VIEW COMPARISON:  Chest radiograph dated September 04, 2022 FINDINGS: The heart size and mediastinal contours are within normal limits. Bilateral lower  lobe hazy lung opacities, right worse than the left with right basilar atelectasis, suggesting ongoing infectious/inflammatory process. Small bilateral pleural effusion. The visualized skeletal structures are unremarkable. IMPRESSION: 1. Bilateral lower lobe hazy lung opacities, right worse than the left with right basilar atelectasis, suggesting ongoing infectious/inflammatory process. 2. Small bilateral pleural effusion. Electronically Signed   By: Larose Hires D.O.   On: 09/16/2022 14:35        Scheduled Meds:  arformoterol  15 mcg Nebulization BID   budesonide (PULMICORT) nebulizer solution  0.25 mg Nebulization BID   Chlorhexidine Gluconate Cloth  6 each Topical Daily   enoxaparin (LOVENOX) injection  30 mg Subcutaneous Q24H   feeding supplement  237 mL Oral BID BM   FLUoxetine  40 mg Oral Daily   guaiFENesin  600 mg Oral BID   levothyroxine  100  mcg Oral Q0600   mirabegron ER  50 mg Oral Daily   mirtazapine  15 mg Oral QHS   pantoprazole  40 mg Oral Daily   revefenacin  175 mcg Nebulization Daily   tamsulosin  0.4 mg Oral Daily   temazepam  15 mg Oral QHS   Continuous Infusions:  sodium chloride 10 mL/hr at 09/18/22 0612   cefTRIAXone (ROCEPHIN)  IV Stopped (09/17/22 2246)   vancomycin Stopped (09/18/22 0540)     LOS: 2 days    Time spent: over 30 min    Lacretia Nicks, MD [facility]   To contact the attending provider between 7A-7P or the covering provider during after hours 7P-7A, please log into the web site [email or web address] and access using universal [place] password for that web site. If you do not have the password, please call the hospital operator.  09/18/2022, 1:39 PM

## 2022-09-18 NOTE — Progress Notes (Signed)
   09/18/22 1459  TOC Brief Assessment  Insurance and Status Reviewed  Patient has primary care physician Yes  Home environment has been reviewed home with spouse  Prior level of function: mod independent overall; spouse can assist  Prior/Current Home Services No current home services  Social Determinants of Health Reivew SDOH reviewed no interventions necessary  Readmission risk has been reviewed Yes  Transition of care needs no transition of care needs at this time

## 2022-09-18 NOTE — Consult Note (Addendum)
[facility]                                                                                        Patient Identification: Patient Name: Kayla [name] MRN: [id number] Admit Date: 09/16/2022 12:50 PM Today's Date: 09/18/2022 Reason for consult: bacteremia  Requesting provider: Dr Lowell Guitar  Active Problems:   HCAP (healthcare-associated pneumonia)   Antibiotics:  Vancomycin 6/25-6/26 Cefepime 6/25-6/26, ceftriaxone 6/26  Lines/Hardware:  Assessment 72 year old female with PMH as below including MS immunodeficiency on IVIG ( off since March 2024), Asthma, bronchiectasis. bilateral vocal cord paralysis admitted with hypoxia   # Recurrent streptococcus pneumonia bacteremia No signs of meningitis  Recent admission 6/9-6/13 for strep pneumonia bacteremia 2/2 pna and discharged on oral ceftin 5/28 bronchoscopy with multiple cultures growing strep pneumoniae, prescribed 10 days PO augmentin No repeat blood cx or TTE last admission  TTE 6/26 poor study with no vegetation  Cause of recurrence could be inadequate abtx last admission vs being off of IVIG for Hypogammaglobulinemia vs possible endocarditis. She reports has an upcoming appt with Allergist to start back on IVIG   # Acute Hpoxic respiratory failure 2/2 Non resoving strep pneumonia pna vs recurrent pna  - 6/25 Rhino virus +, + in previous admission as well - Pulmonary following   # MS # Immunodeficiency - she will need to get back to allergy/immunology to be started back on IVIG    Recommendations  Continue Vancomycin and ceftriaxone for now pending sensi of strep pneumoniae, Previous Strep pneumoniae  was I to penicillin  2 sets of repeat blood cultures ordered  TEE planned for Monday per Cards Monitor CBC, CMP and Vancomycin trough on abtx  Following   Rest of the management as per the primary team. Please call with questions or  concerns.  Thank you for the consult  __________________________________________________________________________________________________________ HPI and Hospital Course: 72 year old female with PMH as below including MS immunodeficiency on IVIG ( off since March 2024), Asthma, bronchiectasis. bilateral vocal cord paralysis, hypothyroidism, GERD/hiatal hernia, Depression, Cervical ca, rt hip arthroplasty s/p revision who presented to the ED on 6/25 from doctors office for hypoxia and shortness of breath.  She was given Xopenex neb.  EMS gave 5 mg albuterol and 5 Mg Atrovent.  Was coughing up brown sputum  Patient was recently discharged on 6/13 where she was admitted for SOB. RVP panel positive for rhinovirus, blood cultures 6/9 1/2 sets positive for strep pneumoniae. No repeat cultures done. Received IV abtx in the hospital, seen by [facility] and was discharged on 10 more days of ceftin. She stopped abtx on day due to s/es as recommended by PCP on day 8.   Patient had an OP bronchoscopy by Pulmonary 5/28 where BAL grew Strep pneumoniae. Patient was treated with a course of augmentin until 6/10. There were other organisms staph epidermidis as well as diptheroids which were considered non pathogenic   At ED afebrile, tachypneic and tachycardic, satting 99% on 6 L [facility] remarkable for NA 129, WBC 40 Status post IVF and cefepime, prednisone Imaging as below ID consulted for recurrent strep pneumoniae bacteremia  ROS:  General- Denies fever, chills, loss of weight, appetite not good  HEENT - Denies headache, blurry vision, neck pain, sinus pain Chest - Denies any chest pain CVS- Denies any dizziness/lightheadedness, syncopal attacks, palpitations Abdomen- Denies any nausea, vomiting, abdominal pain, hematochezia and diarrhea Neuro - Denies any weakness, numbness, tingling sensation Psych - Denies any changes in mood irritability or depressive symptoms GU- Denies any burning, dysuria, hematuria or  increased frequency of urination Skin - denies any rashes/lesions MSK - Denies any joint pain or swelling   Past Medical History:  Diagnosis Date   Arthritis    oa   Asthma    Bilateral vocal cord paralysis    Bronchiectasis (HCC)    Cervical cancer (HCC) 2005   Depression    GERD (gastroesophageal reflux disease)    Hiatal hernia    Hypothyroidism    Immunodeficiency (HCC)    Gets IVIG infusions   Insomnia    Multiple sclerosis (HCC) 1996   Osteoporosis    Pneumonia 2024, 2014, 1992   Urine retention    Past Surgical History:  Procedure Laterality Date   ABDOMINAL HYSTERECTOMY  03/25/2003   for cervical cancer, complete   BRONCHIAL BIOPSY  10/26/2020   Procedure: BRONCHIAL BIOPSIES;  Surgeon: Josephine Igo, DO;  [facility]: [facility];  Service: Pulmonary;;   BRONCHIAL BRUSHINGS  10/26/2020   Procedure: BRONCHIAL BRUSHINGS;  Surgeon: Josephine Igo, DO;  [facility]: [facility];  Service: Pulmonary;;   BRONCHIAL WASHINGS  10/26/2020   Procedure: BRONCHIAL WASHINGS;  Surgeon: Josephine Igo, DO;  [facility];  Service: Pulmonary;;   BRONCHIAL WASHINGS  08/19/2022   Procedure: BRONCHIAL WASHINGS;  Surgeon: Josephine Igo, DO;  [facility]: [facility];  Service: Cardiopulmonary;;   CATARACT EXTRACTION, BILATERAL Bilateral    lens for cataracts   CONVERSION TO TOTAL HIP Right 09/18/2015   Procedure: CONVERSION OF RIGHT HEMI ARTHROPLASTY TO TOTAL HIP ARTHROPLASTY ACETABULAR REVISON ;  Surgeon: Durene Romans, MD;  Location: [facility];  Service: Orthopedics;  Laterality: Right;   HIP ARTHROPLASTY Right 03/01/2013   Procedure: ARTHROPLASTY BIPOLAR HIP;  Surgeon: Shelda Pal, MD;  Location: [facility];  Service: Orthopedics;  Laterality: Right;   HIP CLOSED REDUCTION Right 03/12/2013   Procedure: CLOSED REDUCTION HIP;  Surgeon: [name], MD;  Location: [facility];  Service: Orthopedics;  Laterality: Right;   HIP CLOSED REDUCTION Right 03/19/2013   Procedure: CLOSED  REDUCTION HIP;  Surgeon: Javier Docker, MD;  Location: [facility];  Service: Orthopedics;  Laterality: Right;   HIP CLOSED REDUCTION Right 11/10/2015   Procedure: CLOSED MANIPULATION HIP;  Surgeon: Samson Frederic, MD;  Location: [facility];  Service: Orthopedics;  Laterality: Right;   HIP CLOSED REDUCTION Right 06/05/2017   Procedure: CLOSED REDUCTION HIP;  Surgeon: Ranee Gosselin, MD;  Location: [facility];  Service: Orthopedics;  Laterality: Right;   SMALL INTESTINE SURGERY     TOTAL HIP REVISION Right 06/06/2017   Procedure: TOTAL HIP REVISION;  Surgeon: Ranee Gosselin, MD;  Location: [facility];  Service: Orthopedics;  Laterality: Right;   VESICOVAGINAL FISTULA CLOSURE W/ TAH  03/25/2003   VIDEO BRONCHOSCOPY N/A 10/26/2020   Procedure: VIDEO BRONCHOSCOPY WITH FLUORO;  Surgeon: Josephine Igo, DO;  [facility];  Service: Pulmonary;  Laterality: N/A;   VIDEO BRONCHOSCOPY Left 08/19/2022   Procedure: VIDEO BRONCHOSCOPY WITH FLUORO; WITH BAL;  Surgeon: Josephine Igo, DO;  [facility];  Service: Cardiopulmonary;  Laterality: Left;     Scheduled Meds:  arformoterol  15 mcg Nebulization BID   budesonide (PULMICORT) nebulizer solution  0.25 mg Nebulization BID   Chlorhexidine Gluconate Cloth  6 each Topical Daily   enoxaparin (LOVENOX) injection  30 mg Subcutaneous Q24H   feeding supplement  237 mL Oral BID BM   FLUoxetine  40 mg Oral Daily   guaiFENesin  600 mg Oral BID   levothyroxine  100 mcg Oral Q0600   mirabegron ER  50 mg Oral Daily   mirtazapine  15 mg Oral QHS   pantoprazole  40 mg Oral Daily   revefenacin  175 mcg Nebulization Daily   tamsulosin  0.4 mg Oral Daily   temazepam  15 mg Oral QHS   Continuous Infusions:  sodium chloride 10 mL/hr at 09/18/22 0612   cefTRIAXone (ROCEPHIN)  IV Stopped (09/17/22 2246)   vancomycin Stopped (09/18/22 0540)   PRN Meds:.sodium chloride, albuterol, ondansetron **OR** ondansetron (ZOFRAN) IV, mouth rinse,  senna-docusate  Allergies  Allergen Reactions   Tramadol     hallucinations    Contrast Media [Iodinated Contrast Media] Rash   Iohexol Hives   Morphine And Codeine Other (See Comments)    Reaction:  Hallucinations    Methylprednisolone Other (See Comments)    Reaction:  Decreases pts heart rate    Hydrocodone Itching   Oxycodone-Acetaminophen Rash and Other (See Comments)    Reaction:  Hallucinations    Phenytoin Sodium Extended Rash   Zanaflex [Tizanidine Hydrochloride] Rash   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  Tobacco Use   Smoking status: Never   Smokeless tobacco: Never  Vaping Use   Vaping Use: Never used  Substance and Sexual Activity   Alcohol use: Not Currently    Alcohol/week: 2.0 standard drinks of alcohol    Types: 1 Glasses of wine, 1 Cans of beer per week    Comment: 2x/week    Drug use: No   Sexual activity: Not on file  Other Topics Concern   Not on file  Social History Narrative   Not on file   Social Determinants of Health   Financial Resource Strain: Low Risk  (07/07/2022)   Overall Financial Resource Strain (CARDIA)    Difficulty of Paying Living Expenses: Not very hard  Food Insecurity: No Food Insecurity (09/16/2022)   Hunger Vital Sign    Worried About Running Out of Food in the Last Year: Never true    Ran Out of Food in the Last Year: Never true  Transportation Needs: No Transportation Needs (09/16/2022)   PRAPARE - Transportation    Lack of Transportation (Medical): No    Lack of Transportation (Non-Medical): No  Physical Activity: Inactive (07/07/2022)   Exercise Vital Sign    Days of Exercise per Week: 0 days    Minutes of Exercise per Session: 60 min  Stress: No Stress Concern Present (07/07/2022)   [employer]  of Occupational Health - Occupational Stress Questionnaire    Feeling of Stress : Only a little  Social Connections: Socially Integrated (07/07/2022)   Social Connection and Isolation Panel [NHANES]    Frequency of Communication with Friends and Family: Three times a week    Frequency of Social Gatherings with Friends and Family: Once a week    Attends Religious  Services: 1 to 4 times per year    Active Member of Clubs or Organizations: Yes    Attends Banker Meetings: 1 to 4 times per year    Marital Status: Married  Catering manager Violence: Not At Risk (09/16/2022)   Humiliation, Afraid, Rape, and Kick questionnaire    Fear of Current or Ex-Partner: No    Emotionally Abused: No    Physically Abused: No    Sexually Abused: No    Family History  Problem Relation Age of Onset   Other Mother        COVID   Colon cancer Father    Atrial fibrillation Brother    Heart attack Maternal Grandfather    Parkinson's disease Paternal Grandfather    Esophageal cancer Neg Hx    Stomach cancer Neg Hx    Rectal cancer Neg Hx    Vitals BP (!) 123/52   Pulse 66   Temp 97.6 F (36.4 C) (Oral)   Resp 17   Ht 5\' 3"  (1.6 m)   Wt 44.4 kg   SpO2 100%   BMI 17.34 kg/m    Physical Exam Constitutional:  elderly frail lady lying in the bed and on room air     Comments: HEENT wnl   Cardiovascular:     Rate and Rhythm: Normal rate and regular rhythm.     Heart sounds: s1s2  Pulmonary:     Effort: Pulmonary effort is normal.     Comments: coarse breath sounds b/l  Abdominal:     Palpations: Abdomen is soft.     Tenderness: non distended and non tender   Musculoskeletal:        General: No swelling or tenderness in peripheral joints   Skin:    Comments: No rashes    Neurological:     General: awake, alert and oriented, following commands .    Pertinent Microbiology Results for orders placed or performed during the hospital encounter of 09/16/22  Culture, blood  (single)     Status: None (Preliminary result)   Collection Time: 09/16/22  2:08 PM   Specimen: BLOOD RIGHT FOREARM  Result Value Ref Range Status   Specimen Description   Final    BLOOD RIGHT FOREARM Performed at [facility], [address], [place], [place] [address]    Special Requests   Final    BOTTLES DRAWN AEROBIC AND ANAEROBIC Blood Culture adequate volume Performed at [facility], [address], [place], [place] [address]    Culture  Setup Time   Final    GRAM POSITIVE COCCI IN BOTH AEROBIC AND ANAEROBIC BOTTLES CRITICAL RESULT CALLED TO, READ BACK BY AND VERIFIED WITH: Thana Ates [phone] FCP Performed at [facility], [address] [place], [place] [address]    Culture GRAM POSITIVE COCCI  Final   Report Status PENDING  Incomplete  Blood Culture ID Panel (Reflexed)     Status: Abnormal   Collection Time: 09/16/22  2:08 PM  Result Value Ref Range Status   Enterococcus faecalis NOT DETECTED NOT DETECTED Final   Enterococcus Faecium NOT DETECTED NOT DETECTED Final   Listeria monocytogenes NOT DETECTED NOT DETECTED Final   Staphylococcus species NOT DETECTED NOT DETECTED Final   Staphylococcus aureus (BCID) NOT DETECTED NOT DETECTED Final   Staphylococcus epidermidis NOT DETECTED NOT DETECTED Final   Staphylococcus lugdunensis NOT DETECTED NOT DETECTED Final   Streptococcus species DETECTED (A) NOT DETECTED Final  Comment: CRITICAL RESULT CALLED TO, READ BACK BY AND VERIFIED WITH: PHARMD J. GADHIA [phone] FCP    Streptococcus agalactiae NOT DETECTED NOT DETECTED Final   Streptococcus pneumoniae DETECTED (A) NOT DETECTED Final    Comment: CRITICAL RESULT CALLED TO, READ BACK BY AND VERIFIED WITH: PHARMD J. Teodoro Kil [phone] FCP    Streptococcus pyogenes NOT DETECTED NOT DETECTED Final   A.calcoaceticus-baumannii NOT DETECTED NOT DETECTED Final   Bacteroides fragilis NOT DETECTED NOT DETECTED Final    Enterobacterales NOT DETECTED NOT DETECTED Final   Enterobacter cloacae complex NOT DETECTED NOT DETECTED Final   Escherichia coli NOT DETECTED NOT DETECTED Final   Klebsiella aerogenes NOT DETECTED NOT DETECTED Final   Klebsiella oxytoca NOT DETECTED NOT DETECTED Final   Klebsiella pneumoniae NOT DETECTED NOT DETECTED Final   Proteus species NOT DETECTED NOT DETECTED Final   Salmonella species NOT DETECTED NOT DETECTED Final   Serratia marcescens NOT DETECTED NOT DETECTED Final   Haemophilus influenzae NOT DETECTED NOT DETECTED Final   Neisseria meningitidis NOT DETECTED NOT DETECTED Final   Pseudomonas aeruginosa NOT DETECTED NOT DETECTED Final   Stenotrophomonas maltophilia NOT DETECTED NOT DETECTED Final   Candida albicans NOT DETECTED NOT DETECTED Final   Candida auris NOT DETECTED NOT DETECTED Final   Candida glabrata NOT DETECTED NOT DETECTED Final   Candida krusei NOT DETECTED NOT DETECTED Final   Candida parapsilosis NOT DETECTED NOT DETECTED Final   Candida tropicalis NOT DETECTED NOT DETECTED Final   Cryptococcus neoformans/gattii NOT DETECTED NOT DETECTED Final    Comment: Performed at [facility], [address] [place], [place] [address]  MRSA Next Gen by PCR, Nasal     Status: None   Collection Time: 09/16/22  5:38 PM   Specimen: Nasal Mucosa; Nasal Swab  Result Value Ref Range Status   MRSA by PCR Next Gen NOT DETECTED NOT DETECTED Final    Comment: (NOTE) The GeneXpert MRSA Assay (FDA approved for NASAL specimens only), is one component of a comprehensive MRSA colonization surveillance program. It is not intended to diagnose MRSA infection nor to guide or monitor treatment for MRSA infections. Test performance is not FDA approved in patients less than 61 years old. Performed at [facility], [address], [place], [place] [address]   Respiratory (~20 pathogens) panel by PCR     Status: Abnormal   Collection Time: 09/16/22  6:47 PM    Specimen: Nasopharyngeal Swab; Respiratory  Result Value Ref Range Status   Adenovirus NOT DETECTED NOT DETECTED Final   Coronavirus 229E NOT DETECTED NOT DETECTED Final    Comment: (NOTE) The Coronavirus on the Respiratory Panel, DOES NOT test for the novel  Coronavirus (2019 nCoV)    Coronavirus HKU1 NOT DETECTED NOT DETECTED Final   Coronavirus NL63 NOT DETECTED NOT DETECTED Final   Coronavirus OC43 NOT DETECTED NOT DETECTED Final   Metapneumovirus NOT DETECTED NOT DETECTED Final   Rhinovirus / Enterovirus DETECTED (A) NOT DETECTED Final   Influenza A NOT DETECTED NOT DETECTED Final   Influenza B NOT DETECTED NOT DETECTED Final   Parainfluenza Virus 1 NOT DETECTED NOT DETECTED Final   Parainfluenza Virus 2 NOT DETECTED NOT DETECTED Final   Parainfluenza Virus 3 NOT DETECTED NOT DETECTED Final   Parainfluenza Virus 4 NOT DETECTED NOT DETECTED Final   Respiratory Syncytial Virus NOT DETECTED NOT DETECTED Final   Bordetella pertussis NOT DETECTED NOT DETECTED Final   Bordetella Parapertussis NOT DETECTED NOT DETECTED Final  Chlamydophila pneumoniae NOT DETECTED NOT DETECTED Final   Mycoplasma pneumoniae NOT DETECTED NOT DETECTED Final    Comment: Performed at [facility], [address], [place], [place] [address]  Culture, blood (single) w Reflex to ID Panel     Status: None (Preliminary result)   Collection Time: 09/16/22  7:14 PM   Specimen: BLOOD  Result Value Ref Range Status   Specimen Description   Final    BLOOD BLOOD RIGHT ARM Performed at [facility], [address] [place], [place] [address]    Special Requests   Final    BOTTLES DRAWN AEROBIC ONLY Blood Culture adequate volume Performed at [facility], [address], [place], [place] [address]    Culture   Final    NO GROWTH < 12 HOURS Performed at [facility], [address], [place], [place] [address]    Report Status PENDING  Incomplete  Culture, blood  (single) w Reflex to ID Panel     Status: None (Preliminary result)   Collection Time: 09/16/22  7:18 PM   Specimen: BLOOD  Result Value Ref Range Status   Specimen Description   Final    BLOOD BLOOD LEFT HAND Performed at [facility], [address] [place], [place] [address]    Special Requests   Final    BOTTLES DRAWN AEROBIC ONLY Blood Culture adequate volume Performed at [facility], [address], [place], [place] [address]    Culture   Final    NO GROWTH < 12 HOURS Performed at [facility], [address] [place], [place] [address]    Report Status PENDING  Incomplete   Pertinent Lab seen by me:    Latest Ref Rng & Units 09/18/2022    3:19 AM 09/17/2022    3:25 AM 09/16/2022    1:07 PM  CBC  WBC 4.0 - 10.5 K/uL 23.4  23.7  40.0   Hemoglobin 12.0 - 15.0 g/dL 9.6  9.0  96.2   Hematocrit 36.0 - 46.0 % 31.7  30.7  32.6   Platelets 150 - 400 K/uL 753  622  815       Latest Ref Rng & Units 09/18/2022    3:19 AM 09/17/2022    3:25 AM 09/16/2022    1:07 PM  CMP  Glucose 70 - 99 mg/dL 952  841  324   BUN 8 - 23 mg/dL 13  14  12    Creatinine 0.44 - 1.00 mg/dL 4.01  0.27  2.53   Sodium 135 - 145 mmol/L 138  136  129   Potassium 3.5 - 5.1 mmol/L 4.0  4.3  3.9   Chloride 98 - 111 mmol/L 103  105  92   CO2 22 - 32 mmol/L 26  22  24    Calcium 8.9 - 10.3 mg/dL 9.5  9.1  9.5   Total Protein 6.5 - 8.1 g/dL   6.8   Total Bilirubin 0.3 - 1.2 mg/dL   0.5   Alkaline Phos 38 - 126 U/L   109   AST 15 - 41 U/L   19   ALT 0 - 44 U/L   17      Pertinent Imagings/Other Imagings Plain films and CT images have been personally visualized and interpreted; radiology reports have been reviewed. Decision making incorporated into the Impression / Recommendations.  ECHOCARDIOGRAM COMPLETE  Result Date: 09/17/2022    ECHOCARDIOGRAM REPORT  Patient Name:   TANNER ROTHMAN Date of Exam: 09/17/2022 Medical Rec #:  [id number]       Height:       63.0  in Accession #:    [id number]      Weight:       98.8 lb Date of Birth:  Apr 06, 1950        BSA:          1.433 m Patient Age:    72 years        BP:           97/78 mmHg Patient Gender: F               HR:           73 bpm. Exam Location:  Inpatient Procedure: 2D Echo, Cardiac Doppler and Color Doppler Indications:    Bacteremia  History:        Patient has prior history of Echocardiogram examinations, most                 recent 01/28/2022. COPD and MS, hx cancer,                 Signs/Symptoms:Shortness of Breath and Bacteremia; Risk                 Factors:Sleep Apnea.  Sonographer:    Wallie Char Referring Phys: [phone] A CALDWELL POWELL JR  Sonographer Comments: Image acquisition challenging due to COPD and Challenging image acquisition due to pt coughing throughout entire exam. IMPRESSIONS  1. Left ventricular ejection fraction, by estimation, is 60 to 65%. Left ventricular ejection fraction by 2D MOD biplane is 63.1 %. The left ventricle has normal function. The left ventricle has no regional wall motion abnormalities. Left ventricular diastolic parameters are consistent with Grade I diastolic dysfunction (impaired relaxation).  2. Right ventricular systolic function is normal. The right ventricular size is normal. There is normal pulmonary artery systolic pressure. The estimated right ventricular systolic pressure is 33.0 mmHg.  3. The mitral valve is grossly normal. Trivial mitral valve regurgitation. No evidence of mitral stenosis.  4. The aortic valve is tricuspid. Aortic valve regurgitation is not visualized. No aortic stenosis is present.  5. The inferior vena cava is normal in size with greater than 50% respiratory variability, suggesting right atrial pressure of 3 mmHg. Conclusion(s)/Recommendation(s): No evidence of valvular vegetations on this transthoracic echocardiogram. Consider a transesophageal echocardiogram to exclude infective endocarditis if clinically indicated. FINDINGS  Left Ventricle:  Left ventricular ejection fraction, by estimation, is 60 to 65%. Left ventricular ejection fraction by 2D MOD biplane is 63.1 %. The left ventricle has normal function. The left ventricle has no regional wall motion abnormalities. The left ventricular internal cavity size was normal in size. There is no left ventricular hypertrophy. Left ventricular diastolic parameters are consistent with Grade I diastolic dysfunction (impaired relaxation). Right Ventricle: The right ventricular size is normal. No increase in right ventricular wall thickness. Right ventricular systolic function is normal. There is normal pulmonary artery systolic pressure. The tricuspid regurgitant velocity is 2.74 m/s, and  with an assumed right atrial pressure of 3 mmHg, the estimated right ventricular systolic pressure is 33.0 mmHg. Left Atrium: Left atrial size was normal in size. Right Atrium: Right atrial size was normal in size. Pericardium: There is no evidence of pericardial effusion. Mitral Valve: The mitral valve is grossly normal. Trivial mitral valve regurgitation. No evidence of mitral valve stenosis. MV peak gradient, 4.7 mmHg. The  mean mitral valve gradient is 2.0 mmHg. Tricuspid Valve: The tricuspid valve is grossly normal. Tricuspid valve regurgitation is mild . No evidence of tricuspid stenosis. Aortic Valve: The aortic valve is tricuspid. Aortic valve regurgitation is not visualized. No aortic stenosis is present. Aortic valve mean gradient measures 5.0 mmHg. Aortic valve peak gradient measures 8.2 mmHg. Aortic valve area, by VTI measures 1.81 cm. Pulmonic Valve: The pulmonic valve was grossly normal. Pulmonic valve regurgitation is not visualized. No evidence of pulmonic stenosis. Aorta: The aortic root and ascending aorta are structurally normal, with no evidence of dilitation. Venous: The inferior vena cava is normal in size with greater than 50% respiratory variability, suggesting right atrial pressure of 3 mmHg.  IAS/Shunts: The atrial septum is grossly normal.  LEFT VENTRICLE PLAX 2D                        Biplane EF (MOD) LVIDd:         3.80 cm         LV Biplane EF:   Left LVIDs:         2.70 cm                          ventricular LV PW:         0.80 cm                          ejection LV IVS:        0.80 cm                          fraction by LVOT diam:     1.70 cm                          2D MOD LV SV:         55                               biplane is LV SV Index:   38                               63.1 %. LVOT Area:     2.27 cm                                Diastology                                LV e' medial:    5.58 cm/s LV Volumes (MOD)               LV E/e' medial:  13.9 LV vol d, MOD    61.1 ml       LV e' lateral:   8.64 cm/s A2C:                           LV E/e' lateral: 9.0 LV vol d, MOD    54.0 ml A4C: LV vol s, MOD    23.4 ml A2C: LV vol s, MOD    20.2 ml A4C: LV  SV MOD A2C:   37.7 ml LV SV MOD A4C:   54.0 ml LV SV MOD BP:    37.5 ml RIGHT VENTRICLE             IVC RV Basal diam:  3.20 cm     IVC diam: 2.00 cm RV S prime:     13.10 cm/s TAPSE (M-mode): 2.3 cm LEFT ATRIUM             Index        RIGHT ATRIUM           Index LA diam:        2.60 cm 1.81 cm/m   RA Area:     11.70 cm LA Vol (A2C):   24.0 ml 16.75 ml/m  RA Volume:   26.70 ml  18.64 ml/m LA Vol (A4C):   19.3 ml 13.47 ml/m LA Biplane Vol: 21.8 ml 15.22 ml/m  AORTIC VALVE AV Area (Vmax):    1.78 cm AV Area (Vmean):   1.70 cm AV Area (VTI):     1.81 cm AV Vmax:           143.50 cm/s AV Vmean:          106.100 cm/s AV VTI:            0.304 m AV Peak Grad:      8.2 mmHg AV Mean Grad:      5.0 mmHg LVOT Vmax:         112.50 cm/s LVOT Vmean:        79.450 cm/s LVOT VTI:          0.242 m LVOT/AV VTI ratio: 0.80  AORTA Ao Root diam: 2.90 cm Ao Asc diam:  3.20 cm MITRAL VALVE                TRICUSPID VALVE MV Area (PHT): 3.01 cm     TR Peak grad:   30.0 mmHg MV Area VTI:   2.12 cm     TR Vmax:        274.00 cm/s MV Peak grad:  4.7 mmHg  MV Mean grad:  2.0 mmHg     SHUNTS MV Vmax:       1.08 m/s     Systemic VTI:  0.24 m MV Vmean:      59.8 cm/s    Systemic Diam: 1.70 cm MV Decel Time: 252 msec MV E velocity: 77.40 cm/s MV A velocity: 102.00 cm/s MV E/A ratio:  0.76 Lennie Odor MD Electronically signed by Lennie Odor MD Signature Date/Time: 09/17/2022/4:16:58 PM    Final    DG Chest Port 1 View  Result Date: 09/16/2022 CLINICAL DATA:  Cough and dyspnea. EXAM: PORTABLE CHEST 1 VIEW COMPARISON:  Chest radiograph dated September 04, 2022 FINDINGS: The heart size and mediastinal contours are within normal limits. Bilateral lower lobe hazy lung opacities, right worse than the left with right basilar atelectasis, suggesting ongoing infectious/inflammatory process. Small bilateral pleural effusion. The visualized skeletal structures are unremarkable. IMPRESSION: 1. Bilateral lower lobe hazy lung opacities, right worse than the left with right basilar atelectasis, suggesting ongoing infectious/inflammatory process. 2. Small bilateral pleural effusion. Electronically Signed   By: Larose Hires D.O.   On: 09/16/2022 14:35   DG Swallowing Func-Speech Pathology  Result Date: 09/04/2022 Table formatting from the original result was not included. Modified Barium Swallow Study Patient Details Name: Kayla [name] MRN: [id number] Date of Birth: 1950/08/13 Today's Date: 09/04/2022 HPI/PMH: HPI: Patient  is a 72 y.o. female with PMH: Multiple Sclerosis, recurrent PNA, COPD/asthma, GERD, hiatal hernia, hypothyroidism, bilateral vocal cord paralysis, depression, cervical cancer and h/o UTI. MBS completed in July of 2022 did not show any aspiration of liquids or solids but did show pharyngeal retention of solids which decreased with subsequent swallows. She presented to the hospital on 08/31/22 with worsening SOB. She had brochoscopy done by pulmonology recently and was started on Augmentin due to cultures growing strep PNA. She has been off of her IVIG for approximately  3 months. CT chest from 08/31/22 showed debris in RLL bronchus, b/l lower lobe bronchial wall thickening with multifocal mucus plugging, scattered areas of GGO b/l, RLL area of consolidation. MD started h her on PO diet of Dys 3 (mechanical soft) solids and nectar thick liquids.  Pt reports being very fatigued. Limited barium provided due to pt's potential ileus upon admit.  Clinical Impression: Clinical Impression: Patient presents with mild pharyngeal dypshagia mostly characterized by impaired hyo-laryngeal elevation, laryngeal closure, epiglottic deflection as epiglottis contacts posterior pharyngeal wall with swallow.  Pt anterior curvature of spine appears to contribute to impaired epiglottic deflection. An episode of retrograde propulsion of barium proximal in pharynx observed (not to oropharyngeal space).  Laryngeal penetration of thin and nectar liquids noted as well as pyriform sinus retention which pt clears with breath=hold sustained 2nd swallow.  Various postures including head turn right with and without head of chair reclined with head tilt were not effective to decrease retention.  Prominent PES noted as well.  Swallow function appears largely consisent with findings from most recent MBS in 2022.  Will provide pt with exercises to mitigate her dypshagia.  No aspiration observed and cough observed frequently. Factors that may increase risk of adverse event in presence of aspiration Rubye Oaks & Clearance Coots 2021): Factors that may increase risk of adverse event in presence of aspiration Rubye Oaks & Clearance Coots 2021): Respiratory or GI disease; Poor general health and/or compromised immunity; Reduced saliva Recommendations/Plan: Swallowing Evaluation Recommendations Swallowing Evaluation Recommendations Recommendations: PO diet PO Diet Recommendation: Regular; Thin liquids (Level 0) Liquid Administration via: Cup; Straw Medication Administration: Other (Comment) (as tolerated) Supervision: Patient able to self-feed  Swallowing strategies  : Slow rate; Small bites/sips; effortful swallow; Multiple dry swallows after each bite/sip Postural changes: Position pt fully upright for meals; Stay upright 30-60 min after meals Oral care recommendations: Oral care BID (2x/day) Treatment Plan Treatment Plan Treatment recommendations: Therapy as outlined in treatment plan below Follow-up recommendations: Follow physicians's recommendations for discharge plan and follow up therapies Functional status assessment: Patient has not had a recent decline in their functional status. Treatment frequency: Min 1x/week Treatment duration: 1 week Interventions: Aspiration precaution training; Oropharyngeal exercises; Compensatory techniques; Patient/family education Recommendations Recommendations for follow up therapy are one component of a multi-disciplinary discharge planning process, led by the attending physician.  Recommendations may be updated based on patient status, additional functional criteria and insurance authorization. Assessment: Orofacial Exam: Orofacial Exam Oral Cavity: Oral Hygiene: WFL Oral Cavity - Dentition: Adequate natural dentition Orofacial Anatomy: WFL Oral Motor/Sensory Function: WFL Anatomy: Anatomy: Other (Comment); Suspected cervical osteophytes; Prominent cricopharyngeus Boluses Administered: Boluses Administered Boluses Administered: Thin liquids (Level 0); Mildly thick liquids (Level 2, nectar thick); Solid  Oral Impairment Domain: Oral Impairment Domain Lip Closure: No labial escape Tongue control during bolus hold: Cohesive bolus between tongue to palatal seal Bolus preparation/mastication: Timely and efficient chewing and mashing Bolus transport/lingual motion: Brisk tongue motion Oral residue: Complete oral clearance Location of oral  residue : Floor of mouth Initiation of pharyngeal swallow : Pyriform sinuses  Pharyngeal Impairment Domain: Pharyngeal Impairment Domain Soft palate elevation: No bolus between soft  palate (SP)/pharyngeal wall (PW) Laryngeal elevation: Partial superior movement of thyroid cartilage/partial approximation of arytenoids to epiglottic petiole Anterior hyoid excursion: Partial anterior movement Epiglottic movement: Partial inversion Laryngeal vestibule closure: Incomplete, narrow column air/contrast in laryngeal vestibule Pharyngeal stripping wave : Present - diminished Pharyngoesophageal segment opening: Partial distention/partial duration, partial obstruction of flow Tongue base retraction: Trace column of contrast or air between tongue base and PPW Pharyngeal residue: Collection of residue within or on pharyngeal structures Location of pharyngeal residue: Valleculae; Pharyngeal wall; Pyriform sinuses; Tongue base  Esophageal Impairment Domain: Esophageal Impairment Domain Esophageal clearance upright position: Esophageal retention Pill: Esophageal Impairment Domain Esophageal clearance upright position: Esophageal retention Penetration/Aspiration Scale Score: Penetration/Aspiration Scale Score 1.  Material does not enter airway: Solid; Mildly thick liquids (Level 2, nectar thick) 2.  Material enters airway, remains ABOVE vocal cords then ejected out: Thin liquids (Level 0) Compensatory Strategies: Compensatory Strategies Compensatory strategies: Yes Effortful swallow: Effective Effective Effortful Swallow: Solid Right head turn: Ineffective Effective Right Head Turn: Thin liquid (Level 0); Mildly thick liquid (Level 2, nectar thick) Ineffective Right Head Turn: Thin liquid (Level 0); Mildly thick liquid (Level 2, nectar thick) Reclining posture: Ineffective Effective Reclining Posture: Thin liquid (Level 0); Mildly thick liquid (Level 2, nectar thick) Ineffective Reclining Posture: Thin liquid (Level 0); Mildly thick liquid (Level 2, nectar thick) Posterior head tilt: Ineffective Ineffective Posterior head tilt: Thin liquid (Level 0); Mildly thick liquid (Level 2, nectar thick)   General  Information: Caregiver present: No  Diet Prior to this Study: Dysphagia 3 (mechanical soft); Mildly thick liquids (Level 2, nectar thick)   Temperature : Normal   Respiratory Status: WFL   Supplemental O2: None (Room air)   History of Recent Intubation: No  Behavior/Cognition: Alert; Cooperative; Pleasant mood Self-Feeding Abilities: Able to self-feed Baseline vocal quality/speech: Other (comment); Dysphonic Volitional Cough: Able to elicit Volitional Swallow: Able to elicit Exam Limitations: No limitations Goal Planning: Prognosis for improved oropharyngeal function: Fair Barriers to Reach Goals: Time post onset No data recorded Patient/Family Stated Goal: pt reports she is feeling better but complains of itching Consulted and agree with results and recommendations: Patient Pain: Pain Assessment Pain Assessment: No/denies pain Pain Score: 0 Faces Pain Scale: 4 Breathing: 0 Negative Vocalization: 0 Facial Expression: 0 Body Language: 0 Consolability: 0 PAINAD Score: 0 Pain Location: throat from coughing Pain Descriptors / Indicators: Grimacing; Discomfort Pain Intervention(s): Monitored during session End of Session: Start Time:SLP Start Time (ACUTE ONLY): 1040 Stop Time: SLP Stop Time (ACUTE ONLY): 1100 Time Calculation:SLP Time Calculation (min) (ACUTE ONLY): 20 min Charges: SLP Evaluations $ SLP Speech Visit: 1 Visit SLP Evaluations $BSS Swallow: 1 Procedure $Swallowing Treatment: 1 Procedure SLP visit diagnosis: SLP Visit Diagnosis: Dysphagia, pharyngeal phase (R13.13); Dysphagia, pharyngoesophageal phase (R13.14) Past Medical History: Past Medical History: Diagnosis Date  Arthritis   oa  Asthma   Bilateral vocal cord paralysis   Bronchiectasis (HCC)   Cervical cancer (HCC) 2005  Depression   GERD (gastroesophageal reflux disease)   Hiatal hernia   Hypothyroidism   Immunodeficiency (HCC)   Gets IVIG infusions  Insomnia   Multiple sclerosis (HCC) 1996  Osteoporosis   Pneumonia 2024, 2014, 1992  Urine retention   Past Surgical History: Past Surgical History: Procedure Laterality Date  ABDOMINAL HYSTERECTOMY  03/25/2003  for cervical cancer, complete  BRONCHIAL BIOPSY  10/26/2020  Procedure: BRONCHIAL BIOPSIES;  Surgeon: Josephine Igo, DO;  Location: [facility];  Service: Pulmonary;;  BRONCHIAL BRUSHINGS  10/26/2020  Procedure: BRONCHIAL BRUSHINGS;  Surgeon: Josephine Igo, DO;  Location: [facility];  Service: Pulmonary;;  BRONCHIAL WASHINGS  10/26/2020  Procedure: BRONCHIAL WASHINGS;  Surgeon: Josephine Igo, DO;  Location: [facility];  Service: Pulmonary;;  BRONCHIAL WASHINGS  08/19/2022  Procedure: BRONCHIAL WASHINGS;  Surgeon: Josephine Igo, DO;  Location: [facility];  Service: Cardiopulmonary;;  CATARACT EXTRACTION, BILATERAL Bilateral   lens for cataracts  CONVERSION TO TOTAL HIP Right 09/18/2015  Procedure: CONVERSION OF RIGHT HEMI ARTHROPLASTY TO TOTAL HIP ARTHROPLASTY ACETABULAR REVISON ;  Surgeon: Durene Romans, MD;  Location: [facility];  Service: Orthopedics;  Laterality: Right;  HIP ARTHROPLASTY Right 03/01/2013  Procedure: ARTHROPLASTY BIPOLAR HIP;  Surgeon: Shelda Pal, MD;  Location: [facility];  Service: Orthopedics;  Laterality: Right;  HIP CLOSED REDUCTION Right 03/12/2013  Procedure: CLOSED REDUCTION HIP;  Surgeon: [name], MD;  Location: [facility];  Service: Orthopedics;  Laterality: Right;  HIP CLOSED REDUCTION Right 03/19/2013  Procedure: CLOSED REDUCTION HIP;  Surgeon: Javier Docker, MD;  Location: [facility];  Service: Orthopedics;  Laterality: Right;  HIP CLOSED REDUCTION Right 11/10/2015  Procedure: CLOSED MANIPULATION HIP;  Surgeon: Samson Frederic, MD;  Location: [facility];  Service: Orthopedics;  Laterality: Right;  HIP CLOSED REDUCTION Right 06/05/2017  Procedure: CLOSED REDUCTION HIP;  Surgeon: Ranee Gosselin, MD;  Location: [facility];  Service: Orthopedics;  Laterality: Right;  SMALL INTESTINE SURGERY    TOTAL HIP REVISION Right 06/06/2017  Procedure: TOTAL HIP REVISION;  Surgeon:  Ranee Gosselin, MD;  Location: [facility];  Service: Orthopedics;  Laterality: Right;  VESICOVAGINAL FISTULA CLOSURE W/ TAH  03/25/2003  VIDEO BRONCHOSCOPY N/A 10/26/2020  Procedure: VIDEO BRONCHOSCOPY WITH FLUORO;  Surgeon: Josephine Igo, DO;  Location: [facility];  Service: Pulmonary;  Laterality: N/A;  VIDEO BRONCHOSCOPY Left 08/19/2022  Procedure: VIDEO BRONCHOSCOPY WITH FLUORO; WITH BAL;  Surgeon: Josephine Igo, DO;  Location: [facility];  Service: Cardiopulmonary;  Laterality: Left; Rolena Infante, MS [facility] SLP Acute Rehab Services Office [phone] Chales Abrahams 09/04/2022, 10:11 AM  DG Chest Port 1 View  Result Date: 09/04/2022 CLINICAL DATA:  [id number] CAP (community acquired pneumonia) [phone] EXAM: PORTABLE CHEST - 1 VIEW COMPARISON:  08/31/2022 FINDINGS: The focal areas of airspace consolidation previously seen in the lung bases are slightly less conspicuous but there has been some increase in scattered interstitial opacities predominantly in the lung bases. Left infrahilar airspace opacities persist. No effusion. Heart size and mediastinal contours are within normal limits. No effusion. Old fracture deformity of the proximal right humerus. IMPRESSION: 1. Persistent left infrahilar airspace opacities. 2. Slightly increased interstitial opacities in the lung bases. Electronically Signed   By: Corlis Leak M.D.   On: 09/04/2022 08:14   CT Angio Chest PE W and/or Wo Contrast  Result Date: 08/31/2022 CLINICAL DATA:  Pulmonary embolism suspected, high probability. History of COPD, asthma, recurrent pneumonia. EXAM: CT ANGIOGRAPHY CHEST WITH CONTRAST TECHNIQUE: Multidetector CT imaging of the chest was performed using the standard protocol during bolus administration of intravenous contrast. Multiplanar CT image reconstructions and MIPs were obtained to evaluate the vascular anatomy. RADIATION DOSE REDUCTION: This exam was performed according to the departmental dose-optimization program which includes  automated exposure control, adjustment of the mA and/or kV according to patient size and/or use of iterative reconstruction technique. CONTRAST:  75mL OMNIPAQUE IOHEXOL 350 MG/ML SOLN COMPARISON:  08/07/2022. FINDINGS: Cardiovascular: The heart is normal in size and there is a trace pericardial effusion. There is atherosclerotic calcification of the aorta without evidence of aneurysm. Pulmonary trunk is normal in caliber. No evidence of pulmonary embolism. Evaluation is limited due to respiratory motion artifact. Mediastinum/Nodes: A prominent lymph node is present in the prevascular space measuring 9 mm. No hilar or axillary lymphadenopathy. The trachea and esophagus are within normal limits. Lungs/Pleura: Debris is present in the right lower lobe bronchus. There is bronchial wall thickening in the lower lobes bilaterally with multifocal mucous plugging. Scattered ground-glass opacities are present in the lungs bilaterally with a basilar predominance. Consolidation is noted in the right lower lobe. No effusion or pneumothorax. Upper Abdomen: No acute abnormality. Musculoskeletal: No acute osseous abnormality. Review of the MIP images confirms the above findings. IMPRESSION: 1. No evidence of pulmonary embolism. Evaluation is limited due to respiratory motion artifact. 2. Scattered ground-glass opacities are noted in the lungs bilaterally. Bronchial wall thickening in the lower lobes bilaterally with debris in the right lower lobe bronchus and multifocal mucous plugging and consolidation of the right lung base, concerning for pneumonia. Atypical pneumonia should be considered in the differential diagnosis. 3. Aortic atherosclerosis. Electronically Signed   By: Thornell Sartorius M.D.   On: 08/31/2022 22:18   DG Abd 1 View  Result Date: 08/31/2022 CLINICAL DATA:  Constipation. EXAM: ABDOMEN - 1 VIEW COMPARISON:  Radiograph 06/27/2022, hip CT 06/19/2022 FINDINGS: Moderate stool in the right colon. No significant formed  stool distally. Scattered air throughout small bowel in the central and left abdomen. Multiple surgical clips in the pelvis. Fluffy calcifications projecting over the pelvis or in the soft tissues on prior hip CT. Right hip arthroplasty. IMPRESSION: 1. Moderate stool in the right colon. No significant formed stool distally. 2. Air scattered throughout small bowel that is nondilated, possible ileus. Electronically Signed   By: Narda Rutherford M.D.   On: 08/31/2022 19:49   DG Chest 2 View  Result Date: 08/31/2022 CLINICAL DATA:  Shortness of breath with worsening cough. EXAM: CHEST - 2 VIEW COMPARISON:  06/27/2022 FINDINGS: Patchy bibasilar airspace disease is suspicious for pneumonia. No substantial pleural effusion. No pulmonary edema. The cardiopericardial silhouette is within normal limits for size. No acute bony abnormality. Telemetry leads overlie the chest. IMPRESSION: Patchy bibasilar airspace disease suspicious for pneumonia. Electronically Signed   By: Kennith Center M.D.   On: 08/31/2022 16:01    I have personally spent 96 minutes involved in face-to-face and non-face-to-face activities for this patient on the day of the visit. Professional time spent includes the following activities: Preparing to see the patient (review of tests), Obtaining and/or reviewing separately obtained history (admission/discharge record), Performing a medically appropriate examination and/or evaluation , Ordering medications/tests/procedures, referring and communicating with other health care professionals, Documenting clinical information in the EMR, Independently interpreting results (not separately reported), Communicating results to the patient/family/caregiver, Counseling and educating the patient/family/caregiver and Care coordination (not separately reported).  Electronically signed by:   Plan d/w requesting provider as well as ID pharm D  Note: This document was prepared using dragon voice recognition software  and may include unintentional dictation errors.   Odette Fraction, MD Infectious Disease Physician [facility] Pager: [phone]

## 2022-09-18 NOTE — Progress Notes (Signed)
NAME:  Kayla [name], MRN:  [id number], DOB:  [birth year], LOS: 2 ADMISSION DATE:  09/16/2022, CONSULTATION DATE:  09/18/22  REFERRING MD:  Dr Rosalia Hammers, MD , CHIEF COMPLAINT:  SOB   History of Present Illness:  Kayla [name] is a 72 year old female with PMH notable for Multiple sclerosis, hiatal hernia, bilateral vocal cord immobility, pneumonia, immunodeficiency on IVIG, hypothyroidism, urine retention, GERD, cervical cancer, depression, insomnia. Pt is notably followed by Dr. Marchelle Gearing in the pulmonary clinic for bronchiectasis and astham with chronic cough. Was found to have worsening LLL infiltrate at visit on 08/12/22. Pt has bronchoscopy with bilateral washings of the mainstem bronchi, BAL of Lt and Rt lower lobes. BAL was positive for pneumococcus and treated with Augmentin. Pt had a history of immunodeficiency on IVIG, but this was stopped early in 2024. She presented to [facility] ER on 6/09 with progressive dyspnea and wheezing. Pt was admitted for aspiration PNA and specifically RLL with multifocal mucus plugging. RVP positive for rhinovirus. Cultures were positive and PCCM was consulted to assist with respiratory management. Pt was treated with ABX, steroids, mucolytic's and chest physiotherapy. She was subsequently discharged on 09/04/2022 and advised to follow up with her PCP. Pt states she followed up with PCP last week and stopped her Ceftin ABX therapy on day 8 of a 10 day course due to some adverse reactions secondary to Ceftin- as stated by pt. She has Immunodeficiency on IVIG and was receiving IVIG 11/2020- stopped 05/2022. Pt stopped her infusions for an unknown reason at that time. Since then she has had recurrent pneumonias. She also had pseudomonas in urine culture this admission which is thought to be colonization.  Today 09/16/2022 pt arrives back to pulmonolgy for follow up. Upon arrival she was in respiratory distress. Saturations were 81% on RA and she has audible wheezing accompanied with  coughing. Pt was noted to be grasping for air at that time utilizing accessory muscles, nasal flaring, and sternal retractions noted. She was not discharged with oxygen, and her sats have been very low (in the 70's) for 24-48 hours per her husband who is also at bedside. They have not been using albuterol nebulizers. Pt was placed on 4L with initial adequate response in SPO2 however required another bump to 6L to maintain saturations 96-100%.   PCCM was consulted for ongoing treatment and assistance of management with above issues.  Pertinent  Medical History  Multiple sclerosis, hiatal hernia, bilateral vocal cord immobility, pneumonia, immunodeficiency on IVIG, hypothyroidism, urine retention, GERD, cervical cancer, depression, insomnia.  Significant Hospital Events: Including procedures, antibiotic start and stop dates in addition to other pertinent events   6/09- admit 6/10 speech therapy-- normal swallow assessment 6/11- palliative care consulted; itching? From zosyn-- changed to ceftriaxone 6/13- pt discharged not on home o2- sent with px for Ceftin 10 day course (completed 8 days) with no improvement in symptoms. 6/25- re-admit to hospital with hypoxic RF and SOB.  6/26 -Bcx with strep pneumo.  6/27 weaned to room air. ID consult   Interim History / Subjective:   NAEO  Feels a bit better this morning   Objective   Blood pressure (!) 123/52, pulse 66, temperature 97.6 F (36.4 C), temperature source Oral, resp. rate 17, height 5\' 3"  (1.6 m), weight 44.4 kg, SpO2 95 %.        Intake/Output Summary (Last 24 hours) at 09/18/2022 0831 Last data filed at 09/18/2022 0612 Gross per 24 hour  Intake 401.64 ml  Output  2135 ml  Net -1733.36 ml   Filed Weights   09/16/22 1305 09/16/22 1811 09/18/22 0500  Weight: 42.6 kg 44.8 kg 44.4 kg    Examination:  General - chronically ill elderly F NAD Neuro - AAOx3  HEENT- NCAT pink mm  Pulm- CTAb  CV-rr cap refill <  3 sec GI- soft  thin ndnt  GU- foley  MSK- no acute joint deformity  Skin - pale c/d/w   Resolved Hospital Problem list   N/A  Assessment & Plan:   Acute hypoxic respiratory failure  Asthma, bronchiectasis CAP (strep pneumo and rhinovirus) Vocal cord dysfunction  P -wean O2 for goal >90  -- weaned down to RA  -IS, flutter, CPT, mobility -vanc rocephin as below  -bronvana, budesonide, yupelri PRN albuterol  -dc steroids-- dont think we need to do a taper.   Strep pneumo bacteremia  -recent hospitalization for strep pneumo bacteremia + PNA; unfortunately did not complete abx course and likely this is culprit for getting worse.  P -vanc rocephin -- when sensitivities return hope to narrow further -will probably need TEE  -ID consult is pending   MS Immunodeficiency  -goal had been to resume IVIG, has been off since march 2024 when hospitalized for pubic ramus fx    Best Practice (right click and "Reselect all SmartList Selections" daily)   Diet/type: Regular consistency (see orders) DVT prophylaxis: other GI prophylaxis: N/A Lines: N/A Foley:  N/A Code Status:  full code Last date of multidisciplinary goals of care discussion [09/18/22 ]  Labs   CBC: Recent Labs  Lab 09/16/22 1307 09/17/22 0325 09/18/22 0319  WBC 40.0* 23.7* 23.4*  NEUTROABS 36.8*  --   --   HGB 10.4* 9.0* 9.6*  HCT 32.6* 30.7* 31.7*  MCV 90.1 96.5 94.1  PLT 815* 622* 753*    Basic Metabolic Panel: Recent Labs  Lab 09/16/22 1307 09/17/22 0325 09/18/22 0319  NA 129* 136 138  K 3.9 4.3 4.0  CL 92* 105 103  CO2 24 22 26   GLUCOSE 127* 168* 103*  BUN 12 14 13   CREATININE 0.74 0.53 0.52  CALCIUM 9.5 9.1 9.5  MG  --  1.7 1.7   GFR: Estimated Creatinine Clearance: 45.9 mL/min (by C-G formula based on SCr of 0.52 mg/dL). Recent Labs  Lab 09/16/22 1307 09/16/22 1312 09/16/22 1918 09/17/22 0325 09/18/22 0319  PROCALCITON  --   --  0.41  --   --   WBC 40.0*  --   --  23.7* 23.4*  LATICACIDVEN  --   1.4  --   --   --     Liver Function Tests: Recent Labs  Lab 09/16/22 1307  AST 19  ALT 17  ALKPHOS 109  BILITOT 0.5  PROT 6.8  ALBUMIN 2.8*   No results for input(s): "LIPASE", "AMYLASE" in the last 168 hours. No results for input(s): "AMMONIA" in the last 168 hours.  ABG    Component Value Date/Time   PHART 7.450 09/29/2020 0445   PCO2ART 35.4 09/29/2020 0445   PO2ART 77.6 (L) 09/29/2020 0445   HCO3 27.9 09/16/2022 1312   TCO2 26 01/07/2017 1617   ACIDBASEDEF 5.3 (H) 02/24/2013 1625   O2SAT 70.8 09/16/2022 1312     Coagulation Profile: No results for input(s): "INR", "PROTIME" in the last 168 hours.  Cardiac Enzymes: No results for input(s): "CKTOTAL", "CKMB", "CKMBINDEX", "TROPONINI" in the last 168 hours.  HbA1C: Hgb A1c MFr Bld  Date/Time Value Ref Range Status  02/25/2013  08:35 AM 5.6 <5.7 % Final    Comment:    (NOTE)                                                                       According to the ADA Clinical Practice Recommendations for 2011, when HbA1c is used as a screening test:  >=6.5%   Diagnostic of Diabetes Mellitus           (if abnormal result is confirmed) 5.7-6.4%   Increased risk of developing Diabetes Mellitus References:Diagnosis and Classification of Diabetes Mellitus,Diabetes Care,2011,34(Suppl 1):S62-S69 and Standards of Medical Care in         Diabetes - 2011,Diabetes Care,2011,34 (Suppl 1):S11-S61.    CBG: No results for input(s): "GLUCAP" in the last 168 hours.  CCT: na  Tessie Fass MSN, AGACNP-BC [facility] Pulmonary/Critical Care Medicine Amion for pager  09/18/2022, 8:31 AM

## 2022-09-19 DIAGNOSIS — B953 Streptococcus pneumoniae as the cause of diseases classified elsewhere: Secondary | ICD-10-CM

## 2022-09-19 DIAGNOSIS — R7881 Bacteremia: Secondary | ICD-10-CM

## 2022-09-19 DIAGNOSIS — J9621 Acute and chronic respiratory failure with hypoxia: Secondary | ICD-10-CM | POA: Diagnosis not present

## 2022-09-19 LAB — CULTURE, BLOOD (SINGLE): Special Requests: ADEQUATE

## 2022-09-19 LAB — CBC
HCT: 29.2 % — ABNORMAL LOW (ref 36.0–46.0)
Hemoglobin: 9.1 g/dL — ABNORMAL LOW (ref 12.0–15.0)
MCH: 28.8 pg (ref 26.0–34.0)
MCHC: 31.2 g/dL (ref 30.0–36.0)
MCV: 92.4 fL (ref 80.0–100.0)
Platelets: 727 10*3/uL — ABNORMAL HIGH (ref 150–400)
RBC: 3.16 MIL/uL — ABNORMAL LOW (ref 3.87–5.11)
RDW: 14.8 % (ref 11.5–15.5)
WBC: 14.2 10*3/uL — ABNORMAL HIGH (ref 4.0–10.5)
nRBC: 0 % (ref 0.0–0.2)

## 2022-09-19 LAB — CULTURE, BLOOD (ROUTINE X 2)

## 2022-09-19 NOTE — Progress Notes (Signed)
[facility] Infectious Diseases Follow Up Note  Patient Identification: Patient Name: Kayla [name] MRN: [id number] Admit Date: 09/16/2022 12:50 PM Age: 72 y.o.Today's Date: 09/19/2022  Reason for Visit: fu on recurrent strep pneumoniae  Active Problems:   Acute on chronic respiratory failure with hypoxia (HCC)   HCAP (healthcare-associated pneumonia)  Antibiotics:  Vancomycin 6/25-6/26 Cefepime 6/25-6/26, ceftriaxone 6/26   Lines/Hardware:  Interval Events: afebrile, WBC downtrending  Assessment 72 year old female with PMH as below including MS immunodeficiency on IVIG ( off since March 2024), Asthma, bronchiectasis. bilateral vocal cord paralysis admitted with hypoxia    # Recurrent streptococcus pneumonia bacteremia No signs of meningitis  Recent admission 6/9-6/13 for strep pneumonia bacteremia 2/2 pna and discharged on oral ceftin 5/28 bronchoscopy with multiple cultures growing strep pneumoniae, prescribed 10 days PO augmentin No repeat blood cx or TTE last admission  TTE 6/26 poor study with no vegetation  Cause of recurrence could be inadequate abtx last admission vs being off of IVIG for Hypogammaglobulinemia vs possible endocarditis. She reports has an upcoming appt with Allergist to start back on IVIG    # Acute Hpoxic respiratory failure 2/2 Non resoving strep pneumonia pna vs recurrent pna  - 6/25 Rhino virus +, + in previous admission as well - Pulmonary following    # MS # Immunodeficiency - she will need to get back to allergy/immunology to be started back on IVIG   Recommendations DC Vancomycin, continue ceftriaxone, sensitivities reviewed Fu pending blood cultures  TEE possibly next week, with need to intubate per Pulmonary  Monitor CBC, CMP  Dr Thedore Mins covering this weekend. New ID team to follow Monday  Rest of the management as per the primary team. Thank you for the consult. Please page with  pertinent questions or concerns.  ______________________________________________________________________ Subjective patient seen and examined at the bedside. She is constantly coughing. Husband at bedside and knows the reason for getting TEE  Vitals BP (!) 121/56   Pulse 71   Temp 97.9 F (36.6 C) (Oral)   Resp 18   Ht 5\' 3"  (1.6 m)   Wt 44.4 kg   SpO2 99%   BMI 17.34 kg/m     Physical Exam Constitutional:  elderly frain lady sitting in the  bed and on 2 L [place]    Comments:   Cardiovascular:     Rate and Rhythm: Normal rate and regular rhythm.     Heart sounds:  Pulmonary:     Effort: Pulmonary effort is normal on [place]    Comments: coarse breath sounds bilaterally  Abdominal:     Palpations: Abdomen is soft.     Tenderness: non distended   Musculoskeletal:        General: No swelling or tenderness.   Skin:    Comments: No rashes   Neurological:     General: awake, alert and following commands  Pertinent Microbiology Results for orders placed or performed during the hospital encounter of 09/16/22  Culture, blood (single)     Status: Abnormal   Collection Time: 09/16/22  2:08 PM   Specimen: BLOOD RIGHT FOREARM  Result Value Ref Range Status   Specimen Description   Final    BLOOD RIGHT FOREARM Performed at [facility], [address], [place], [place] [address]    Special Requests   Final    BOTTLES DRAWN AEROBIC AND ANAEROBIC Blood Culture adequate volume Performed at [facility], [address] [place], [place] [address]    Culture  Setup Time   Final    GRAM POSITIVE COCCI IN BOTH AEROBIC AND ANAEROBIC BOTTLES CRITICAL RESULT CALLED TO, READ BACK BY AND VERIFIED WITH: Thana Ates [phone] FCP Performed at [facility], [address] [place], [place] [address]    Culture STREPTOCOCCUS PNEUMONIAE (A)  Final   Report Status 09/19/2022 FINAL  Final   Organism ID, Bacteria STREPTOCOCCUS PNEUMONIAE   Final      Susceptibility   Streptococcus pneumoniae - MIC*    ERYTHROMYCIN >=8 RESISTANT Resistant     LEVOFLOXACIN 0.5 SENSITIVE Sensitive     VANCOMYCIN 0.5 SENSITIVE Sensitive     PENICILLIN (meningitis) 1 RESISTANT Resistant     PENO - penicillin 1      PENICILLIN (non-meningitis) 1 SENSITIVE Sensitive     PENICILLIN (oral) 1 INTERMEDIATE Intermediate     CEFTRIAXONE (non-meningitis) 0.5 SENSITIVE Sensitive     CEFTRIAXONE (meningitis) 0.5 SENSITIVE Sensitive     * STREPTOCOCCUS PNEUMONIAE  Blood Culture ID Panel (Reflexed)     Status: Abnormal   Collection Time: 09/16/22  2:08 PM  Result Value Ref Range Status   Enterococcus faecalis NOT DETECTED NOT DETECTED Final   Enterococcus Faecium NOT DETECTED NOT DETECTED Final   Listeria monocytogenes NOT DETECTED NOT DETECTED Final   Staphylococcus species NOT DETECTED NOT DETECTED Final   Staphylococcus aureus (BCID) NOT DETECTED NOT DETECTED Final   Staphylococcus epidermidis NOT DETECTED NOT DETECTED Final   Staphylococcus lugdunensis NOT DETECTED NOT DETECTED Final   Streptococcus species DETECTED (A) NOT DETECTED Final    Comment: CRITICAL RESULT CALLED TO, READ BACK BY AND VERIFIED WITH: PHARMD J. GADHIA [phone] FCP    Streptococcus agalactiae NOT DETECTED NOT DETECTED Final   Streptococcus pneumoniae DETECTED (A) NOT DETECTED Final    Comment: CRITICAL RESULT CALLED TO, READ BACK BY AND VERIFIED WITH: PHARMD J. GADHIA [phone] FCP    Streptococcus pyogenes NOT DETECTED NOT DETECTED Final   A.calcoaceticus-baumannii NOT DETECTED NOT DETECTED Final   Bacteroides fragilis NOT DETECTED NOT DETECTED Final   Enterobacterales NOT DETECTED NOT DETECTED Final   Enterobacter cloacae complex NOT DETECTED NOT DETECTED Final   Escherichia coli NOT DETECTED NOT DETECTED Final   Klebsiella aerogenes NOT DETECTED NOT DETECTED Final   Klebsiella oxytoca NOT DETECTED NOT DETECTED Final   Klebsiella pneumoniae NOT DETECTED NOT  DETECTED Final   Proteus species NOT DETECTED NOT DETECTED Final   Salmonella species NOT DETECTED NOT DETECTED Final   Serratia marcescens NOT DETECTED NOT DETECTED Final   Haemophilus influenzae NOT DETECTED NOT DETECTED Final   Neisseria meningitidis NOT DETECTED NOT DETECTED Final   Pseudomonas aeruginosa NOT DETECTED NOT DETECTED Final   Stenotrophomonas maltophilia NOT DETECTED NOT DETECTED Final   Candida albicans NOT DETECTED NOT DETECTED Final   Candida auris NOT DETECTED NOT DETECTED Final   Candida glabrata NOT DETECTED NOT DETECTED Final   Candida krusei NOT DETECTED NOT DETECTED Final   Candida parapsilosis NOT DETECTED NOT DETECTED Final   Candida tropicalis NOT DETECTED NOT DETECTED Final   Cryptococcus neoformans/gattii NOT DETECTED NOT DETECTED Final    Comment: Performed at [facility], [address], [place], [place] [address]  MRSA Next Gen by PCR, Nasal     Status: None   Collection Time: 09/16/22  5:38 PM   Specimen: Nasal Mucosa; Nasal Swab  Result Value Ref Range Status   MRSA by PCR Next Gen NOT DETECTED NOT DETECTED Final  Comment: (NOTE) The GeneXpert MRSA Assay (FDA approved for NASAL specimens only), is one component of a comprehensive MRSA colonization surveillance program. It is not intended to diagnose MRSA infection nor to guide or monitor treatment for MRSA infections. Test performance is not FDA approved in patients less than 25 years old. Performed at [facility], [address] [place], [place] [address]   Respiratory (~20 pathogens) panel by PCR     Status: Abnormal   Collection Time: 09/16/22  6:47 PM   Specimen: Nasopharyngeal Swab; Respiratory  Result Value Ref Range Status   Adenovirus NOT DETECTED NOT DETECTED Final   Coronavirus 229E NOT DETECTED NOT DETECTED Final    Comment: (NOTE) The Coronavirus on the Respiratory Panel, DOES NOT test for the novel  Coronavirus (2019 nCoV)    Coronavirus HKU1 NOT  DETECTED NOT DETECTED Final   Coronavirus NL63 NOT DETECTED NOT DETECTED Final   Coronavirus OC43 NOT DETECTED NOT DETECTED Final   Metapneumovirus NOT DETECTED NOT DETECTED Final   Rhinovirus / Enterovirus DETECTED (A) NOT DETECTED Final   Influenza A NOT DETECTED NOT DETECTED Final   Influenza B NOT DETECTED NOT DETECTED Final   Parainfluenza Virus 1 NOT DETECTED NOT DETECTED Final   Parainfluenza Virus 2 NOT DETECTED NOT DETECTED Final   Parainfluenza Virus 3 NOT DETECTED NOT DETECTED Final   Parainfluenza Virus 4 NOT DETECTED NOT DETECTED Final   Respiratory Syncytial Virus NOT DETECTED NOT DETECTED Final   Bordetella pertussis NOT DETECTED NOT DETECTED Final   Bordetella Parapertussis NOT DETECTED NOT DETECTED Final   Chlamydophila pneumoniae NOT DETECTED NOT DETECTED Final   Mycoplasma pneumoniae NOT DETECTED NOT DETECTED Final    Comment: Performed at [facility], [address], [place], [place] [address]  Culture, blood (single) w Reflex to ID Panel     Status: None (Preliminary result)   Collection Time: 09/16/22  7:14 PM   Specimen: BLOOD  Result Value Ref Range Status   Specimen Description   Final    BLOOD BLOOD RIGHT ARM Performed at [facility], [address] [place], [place] [address]    Special Requests   Final    BOTTLES DRAWN AEROBIC ONLY Blood Culture adequate volume Performed at [facility], [address] [place], [place] [address]    Culture   Final    NO GROWTH 3 DAYS Performed at [facility], [address] [place], [place] [address]    Report Status PENDING  Incomplete  Culture, blood (single) w Reflex to ID Panel     Status: None (Preliminary result)   Collection Time: 09/16/22  7:18 PM   Specimen: BLOOD  Result Value Ref Range Status   Specimen Description   Final    BLOOD BLOOD LEFT HAND Performed at [facility], [address] [place], [place] [address]    Special  Requests   Final    BOTTLES DRAWN AEROBIC ONLY Blood Culture adequate volume Performed at [facility], [address] [place], [place] [address]    Culture   Final    NO GROWTH 3 DAYS Performed at [facility], [address], [place], [place] [address]    Report Status PENDING  Incomplete  Culture, blood (Routine X 2) w Reflex to ID Panel     Status: None (Preliminary result)   Collection Time: 09/18/22  3:33 PM   Specimen: BLOOD LEFT HAND  Result Value  Ref Range Status   Specimen Description   Final    BLOOD LEFT HAND Performed at [facility], [address] [place] [address] [place], [place] [address]    Special Requests   Final    AEROBIC BOTTLE ONLY Blood Culture adequate volume Performed at [facility], [address] [place], [place] [address]    Culture   Final    NO GROWTH < 24 HOURS Performed at [facility], [address] [place], [place] [address]    Report Status PENDING  Incomplete  Culture, blood (Routine X 2) w Reflex to ID Panel     Status: None (Preliminary result)   Collection Time: 09/18/22  3:43 PM   Specimen: BLOOD LEFT HAND  Result Value Ref Range Status   Specimen Description   Final    BLOOD LEFT HAND Performed at [facility], [address] [place], [place] [address]    Special Requests   Final    AEROBIC BOTTLE ONLY Blood Culture adequate volume Performed at [facility], [address] [place], [place] [address]    Culture   Final    NO GROWTH < 24 HOURS Performed at [facility], [address] [place], [place] [address]    Report Status PENDING  Incomplete   Pertinent Lab.    Latest Ref Rng & Units 09/19/2022    7:38 AM 09/18/2022    3:19 AM 09/17/2022    3:25 AM  CBC  WBC 4.0 - 10.5 K/uL 14.2  23.4  23.7   Hemoglobin 12.0 - 15.0 g/dL 9.1  9.6  9.0   Hematocrit 36.0 - 46.0 % 29.2  31.7  30.7   Platelets 150 - 400 K/uL 727  753  622        Latest Ref Rng & Units 09/18/2022    3:19 AM 09/17/2022    3:25 AM 09/16/2022    1:07 PM  CMP  Glucose 70 - 99 mg/dL 010  272  536   BUN 8 - 23 mg/dL 13  14  12    Creatinine 0.44 - 1.00 mg/dL 6.44  0.34  7.42   Sodium 135 - 145 mmol/L 138  136  129   Potassium 3.5 - 5.1 mmol/L 4.0  4.3  3.9   Chloride 98 - 111 mmol/L 103  105  92   CO2 22 - 32 mmol/L 26  22  24    Calcium 8.9 - 10.3 mg/dL 9.5  9.1  9.5   Total Protein 6.5 - 8.1 g/dL   6.8   Total Bilirubin 0.3 - 1.2 mg/dL   0.5   Alkaline Phos 38 - 126 U/L   109   AST 15 - 41 U/L   19   ALT 0 - 44 U/L   17      Pertinent Imaging today Plain films and CT images have been personally visualized and interpreted; radiology reports have been reviewed. Decision making incorporated into the Impression  No results found.  I have personally spent 54 minutes involved in face-to-face and non-face-to-face activities for this patient on the day of the visit. Professional time spent includes the following activities: Preparing to see the patient (review of tests), Obtaining and/or reviewing separately obtained history (admission/discharge record), Performing a medically appropriate examination and/or evaluation , Ordering medications/tests/procedures, referring and communicating with other health care professionals, Documenting clinical information in the EMR, Independently interpreting results (not separately reported), Communicating results to the  patient/family/caregiver, Counseling and educating the patient/family/caregiver and Care coordination (not separately reported).   Plan d/w requesting provider as well as ID pharm D  Note: This document was prepared using dragon voice recognition software and may include unintentional dictation errors.   Electronically signed by:   Odette Fraction, MD Infectious Disease Physician [facility] for Infectious Disease Pager: [phone]

## 2022-09-19 NOTE — Progress Notes (Signed)
NAME:  Kayla [name], MRN:  [id number], DOB:  1950/05/11, LOS: 3 ADMISSION DATE:  09/16/2022, CONSULTATION DATE:  09/19/22  REFERRING MD:  Dr Rosalia Hammers, MD , CHIEF COMPLAINT:  SOB   History of Present Illness:  Kayla [name] is a 72 year old female with PMH notable for Multiple sclerosis, hiatal hernia, bilateral vocal cord immobility, pneumonia, immunodeficiency on IVIG, hypothyroidism, urine retention, GERD, cervical cancer, depression, insomnia. Pt is notably followed by Dr. Marchelle Gearing in the pulmonary clinic for bronchiectasis and astham with chronic cough. Was found to have worsening LLL infiltrate at visit on 08/12/22. Pt has bronchoscopy with bilateral washings of the mainstem bronchi, BAL of Lt and Rt lower lobes. BAL was positive for pneumococcus and treated with Augmentin. Pt had a history of immunodeficiency on IVIG, but this was stopped early in 2024. She presented to [facility] ER on 6/09 with progressive dyspnea and wheezing. Pt was admitted for aspiration PNA and specifically RLL with multifocal mucus plugging. RVP positive for rhinovirus. Cultures were positive and PCCM was consulted to assist with respiratory management. Pt was treated with ABX, steroids, mucolytic's and chest physiotherapy. She was subsequently discharged on 09/04/2022 and advised to follow up with her PCP. Pt states she followed up with PCP last week and stopped her Ceftin ABX therapy on day 8 of a 10 day course due to some adverse reactions secondary to Ceftin- as stated by pt. She has Immunodeficiency on IVIG and was receiving IVIG 11/2020- stopped 05/2022. Pt stopped her infusions for an unknown reason at that time. Since then she has had recurrent pneumonias. She also had pseudomonas in urine culture this admission which is thought to be colonization.  Today 09/16/2022 pt arrives back to pulmonolgy for follow up. Upon arrival she was in respiratory distress. Saturations were 81% on RA and she has audible wheezing accompanied with  coughing. Pt was noted to be grasping for air at that time utilizing accessory muscles, nasal flaring, and sternal retractions noted. She was not discharged with oxygen, and her sats have been very low (in the 70's) for 24-48 hours per her husband who is also at bedside. They have not been using albuterol nebulizers. Pt was placed on 4L with initial adequate response in SPO2 however required another bump to 6L to maintain saturations 96-100%.   PCCM was consulted for ongoing treatment and assistance of management with above issues.  Pertinent  Medical History  Multiple sclerosis, hiatal hernia, bilateral vocal cord immobility, pneumonia, immunodeficiency on IVIG, hypothyroidism, urine retention, GERD, cervical cancer, depression, insomnia.  Significant Hospital Events: Including procedures, antibiotic start and stop dates in addition to other pertinent events   6/09- admit 6/10 speech therapy-- normal swallow assessment 6/11- palliative care consulted; itching? From zosyn-- changed to ceftriaxone 6/13- pt discharged not on home o2- sent with px for Ceftin 10 day course (completed 8 days) with no improvement in symptoms. 6/25- re-admit to hospital with hypoxic RF and SOB.  6/26 -Bcx with strep pneumo.  6/27 weaned to room air. ID consult   Interim History / Subjective:     Objective   Blood pressure (!) 121/56, pulse 71, temperature 97.9 F (36.6 C), temperature source Oral, resp. rate 18, height 5\' 3"  (1.6 m), weight 44.4 kg, SpO2 99 %.        Intake/Output Summary (Last 24 hours) at 09/19/2022 0827 Last data filed at 09/19/2022 0733 Gross per 24 hour  Intake 463.79 ml  Output 1475 ml  Net -1011.21 ml  Filed Weights   09/16/22 1305 09/16/22 1811 09/18/22 0500  Weight: 42.6 kg 44.8 kg 44.4 kg    Examination:  General -thin elderly woman, laying in bed, somewhat frail, no distress Neuro -awake, alert, interacts appropriately, follows commands, globally weak HEENT-oropharynx  moist, hoarse voice Pulm-coarse bilaterally, no wheezes or crackles CV-regular, no murmur GI-thin, nondistended, nontender MSK-no deformities Skin -no rash  Resolved Hospital Problem list   N/A  Assessment & Plan:   Acute hypoxic respiratory failure  Asthma, bronchiectasis CAP (strep pneumo and rhinovirus) Vocal cord paresis and dysfunction  P -Wean oxygen as able.  She has required 2 L while sleeping, hopefully can get to room air during the day.  I suspect that this will be her baseline -Continue to push pulmonary hygiene, I-S, flutter valve, bed chest PT -Defer steroids, doing well since discontinuation -BD regimen: Roxy Manns, albuterol as needed -Continue budesonide nebs as ordered  Strep pneumo bacteremia  -recent hospitalization for strep pneumo bacteremia + PNA; unfortunately did not complete abx course and likely this is culprit for getting worse.  P -Sensitivities pending.  Appreciate ID input.  Continue ceftriaxone, vancomycin for now, narrow pending on culture results -TTE reassuring, will need a TEE.  I discussed with her and her husband the fact that this would likely need to be done with intubation and mechanical ventilation.  I do believe she can tolerate this.  There may be some extended support that would be required, possibly BiPAP given her respiratory muscle weakness.  They understand the rationale and would agree to proceed  MS Immunodeficiency  -goal had been to resume IVIG, has been off since march 2024 when hospitalized for pubic ramus fx    Best Practice (right click and "Reselect all SmartList Selections" daily)   Diet/type: Regular consistency (see orders) DVT prophylaxis: other GI prophylaxis: N/A Lines: N/A Foley:  N/A Code Status:  full code Last date of multidisciplinary goals of care discussion [09/19/22 ]  Labs   CBC: Recent Labs  Lab 09/16/22 1307 09/17/22 0325 09/18/22 0319 09/19/22 0738  WBC 40.0* 23.7* 23.4* 14.2*   NEUTROABS 36.8*  --   --   --   HGB 10.4* 9.0* 9.6* 9.1*  HCT 32.6* 30.7* 31.7* 29.2*  MCV 90.1 96.5 94.1 92.4  PLT 815* 622* 753* 727*    Basic Metabolic Panel: Recent Labs  Lab 09/16/22 1307 09/17/22 0325 09/18/22 0319  NA 129* 136 138  K 3.9 4.3 4.0  CL 92* 105 103  CO2 24 22 26   GLUCOSE 127* 168* 103*  BUN 12 14 13   CREATININE 0.74 0.53 0.52  CALCIUM 9.5 9.1 9.5  MG  --  1.7 1.7   GFR: Estimated Creatinine Clearance: 45.9 mL/min (by C-G formula based on SCr of 0.52 mg/dL). Recent Labs  Lab 09/16/22 1307 09/16/22 1312 09/16/22 1918 09/17/22 0325 09/18/22 0319 09/19/22 0738  PROCALCITON  --   --  0.41  --   --   --   WBC 40.0*  --   --  23.7* 23.4* 14.2*  LATICACIDVEN  --  1.4  --   --   --   --     Liver Function Tests: Recent Labs  Lab 09/16/22 1307  AST 19  ALT 17  ALKPHOS 109  BILITOT 0.5  PROT 6.8  ALBUMIN 2.8*   No results for input(s): "LIPASE", "AMYLASE" in the last 168 hours. No results for input(s): "AMMONIA" in the last 168 hours.  ABG    Component Value  Date/Time   PHART 7.450 09/29/2020 0445   PCO2ART 35.4 09/29/2020 0445   PO2ART 77.6 (L) 09/29/2020 0445   HCO3 27.9 09/16/2022 1312   TCO2 26 01/07/2017 1617   ACIDBASEDEF 5.3 (H) 02/24/2013 1625   O2SAT 70.8 09/16/2022 1312     Coagulation Profile: No results for input(s): "INR", "PROTIME" in the last 168 hours.  Cardiac Enzymes: No results for input(s): "CKTOTAL", "CKMB", "CKMBINDEX", "TROPONINI" in the last 168 hours.  HbA1C: Hgb A1c MFr Bld  Date/Time Value Ref Range Status  02/25/2013 08:35 AM 5.6 <5.7 % Final    Comment:    (NOTE)                                                                       According to the ADA Clinical Practice Recommendations for 2011, when HbA1c is used as a screening test:  >=6.5%   Diagnostic of Diabetes Mellitus           (if abnormal result is confirmed) 5.7-6.4%   Increased risk of developing Diabetes Mellitus References:Diagnosis  and Classification of Diabetes Mellitus,Diabetes Care,2011,34(Suppl 1):S62-S69 and Standards of Medical Care in         Diabetes - 2011,Diabetes Care,2011,34 (Suppl 1):S11-S61.    CBG: No results for input(s): "GLUCAP" in the last 168 hours.  CCT: na  Levy Pupa, MD, PhD 09/19/2022, 8:27 AM [facility] [phone] or if no answer before 7:00PM call [phone] For any issues after 7:00PM please call eLink [phone]

## 2022-09-19 NOTE — Progress Notes (Signed)
PROGRESS NOTE    [name]  ZOX:[id number] DOB: 12/20/1950 DOA: 09/16/2022 PCP: [name], MD  Chief Complaint  Patient presents with   Shortness of Breath    Brief Narrative:   Kayla [name] is Kayla [name] 72 y.o. female with medical history significant for multiple sclerosis, bilateral vocal cord immobility, immunodeficiency on IVIG, hypothyroidism, GERD, recently admitted to [facility] from 6/9 to 6/13 for aspiration pneumonia, right lower lobe multifocal mucous plugging, rhinovirus positive and also found to have strep pneumo bacteremia.  She was treated in the hospital, weaned to room air, and discharged home to complete 10 more days of p.o. Ceftin.  Patient states she did not really improve after leaving the hospital.  She was having some side effects, so cefdinir was stopped on day 8 by her PCP.  She went to her outpatient pulmonology follow-up today, was noted to have sats of 84%, EMS was called, given her bronchodilator and brought to the emergency department.   ED Course: Patient saturating 99% on 4 L nasal cannula on arrival to the ER, tachycardic heart rate 102.  Blood pressure 113/51.  Lab work shows WBC 40 K, hemoglobin 10 and stable, platelets 815.  Hyponatremia 129, electrolytes and renal function at normal baseline.  Chest x-ray demonstrates small bilateral pleural effusion, bilateral pneumonia right sided consolidation slightly worse than the left.  Patient was given empiric IV vancomycin, and IV cefepime, started on IV fluids, and hospitalist was contacted for admission.  Pulmonology was also consulted prior to hospitalist.  Assessment & Plan:   Active Problems:   Acute on chronic respiratory failure with hypoxia (HCC)   HCAP (healthcare-associated pneumonia)  Kayla [name] is Kayla [name] 72 y.o. female with medical history significant for multiple sclerosis, bilateral vocal cord immobility, immunodeficiency on IVIG, hypothyroidism, GERD, recent hospital stay for strep pneumo  bacteremia and pneumonia being admitted to the hospital with continued and worsening hypoxic respiratory failure and healthcare acquired pneumonia.   Acute hypoxic respiratory failure  Community Acquired Pneumonia Recurrent vs Nonresolving Pneumonia Rhinovirus Infection  -pulmonary c/s, appreciate recs --RVP with rhinovirus (this was present 2 weeks ago, unclear significance) --sputum cx, urine strep, urine leginella -- strep pneumoniae bacteremia (see below) - wean O2 as tolerated -- broad spectrum abx (narrow as appropriate)  -- scheduled and prn nebs -- flutter valve -- strict I/O, daily weights    Strep Pneumoniae Bacteremia  Recent Strep Pneumo Bacteremia Recent strep pneumo bacteremia, concerning for recurrent or inadequately treated bacteremia Strep pneumoniae bacteremia 6/25, susceptibilities pending Appreciate ID consult -> vanc/ceftriaxone, follow repeat cultures, TEE Monday Echo with normal EF, grade 1 diastolic dysfunction, no evidence of valvular vegetations  Hyponatremia-resolved, follow    Depression-Prozac, remeron   GERD-Protonix p.o.   Hypothyroidism-Synthroid 100 mcg p.o. daily   History of immune deficiency, with IVIG therapy- due to MS immunomodulatory therapy.  Followed by Dr. Livingston Diones with [facility] in [place], [place].  has been off of IVIG since hospitalization for pubic ramus fracture in March.   Needs to reestablish outpatient   Anemia- gradual downtrend over last few months Iron (wnl), folate (wnl), b12 (elevated), ferritin (wnl) -> suggestive of chronic disease, will continue to trend   Insomnia- restoril, remeron at bedtime  Overactive Bladder Myrbetriq  Multiple Sclerosis Followed by neurologist in [place], Dr. Konrad Felix per previous note  Bilateral Vocal Cord Immobility with Small Transglottal Airway Followed by Dr. Harriette Ohara with ENT at [facility]    DVT prophylaxis:  lovenox Code Status:  full Family Communication: none Disposition:   Status is: Inpatient Remains inpatient appropriate because: need for continued inpatient care   Consultants:  PCCM  Procedures:  none  Antimicrobials:  Anti-infectives (From admission, onward)    Start     Dose/Rate Route Frequency Ordered Stop   09/18/22 0400  vancomycin (VANCOCIN) IVPB 1000 mg/200 mL premix        1,000 mg 200 mL/hr over 60 Minutes Intravenous Every 36 hours 09/16/22 1809     09/17/22 2200  cefTRIAXone (ROCEPHIN) 2 g in sodium chloride 0.9 % 100 mL IVPB        2 g 200 mL/hr over 30 Minutes Intravenous Every 24 hours 09/17/22 1047     09/17/22 0000  ceFEPIme (MAXIPIME) 2 g in sodium chloride 0.9 % 100 mL IVPB  Status:  Discontinued        2 g 200 mL/hr over 30 Minutes Intravenous Every 24 hours 09/16/22 1638 09/16/22 1639   09/17/22 0000  ceFEPIme (MAXIPIME) 500 mg in dextrose 5 % 50 mL IVPB  Status:  Discontinued        500 mg 110 mL/hr over 30 Minutes Intravenous Every 24 hours 09/16/22 1639 09/16/22 1800   09/16/22 2200  ceFEPIme (MAXIPIME) 2 g in sodium chloride 0.9 % 100 mL IVPB  Status:  Discontinued        2 g 200 mL/hr over 30 Minutes Intravenous Every 12 hours 09/16/22 1802 09/17/22 1047   09/16/22 1500  vancomycin (VANCOREADY) IVPB 750 mg/150 mL        750 mg 150 mL/hr over 60 Minutes Intravenous  Once 09/16/22 1447 09/16/22 1638   09/16/22 1500  ceFEPIme (MAXIPIME) 2 g in sodium chloride 0.9 % 100 mL IVPB        2 g 200 mL/hr over 30 Minutes Intravenous  Once 09/16/22 1451 09/16/22 1549   09/16/22 1445  ceFEPIme (MAXIPIME) 1 g in sodium chloride 0.9 % 100 mL IVPB  Status:  Discontinued        1 g 200 mL/hr over 30 Minutes Intravenous  Once 09/16/22 1438 09/16/22 1452       Subjective: Feels tired today  Objective: Vitals:   09/19/22 0800 09/19/22 0815 09/19/22 0819 09/19/22 0823  BP: (!) 121/56     Pulse: 71     Resp: 18     Temp:  97.9 F (36.6 C)    TempSrc:  Oral    SpO2: 94%  97%  99%  Weight:      Height:        Intake/Output Summary (Last 24 hours) at 09/19/2022 1017 Last data filed at 09/19/2022 0733 Gross per 24 hour  Intake 463.79 ml  Output 1475 ml  Net -1011.21 ml   Filed Weights   09/16/22 1305 09/16/22 1811 09/18/22 0500  Weight: 42.6 kg 44.8 kg 44.4 kg    Examination:  General: No acute distress. Cardiovascular: RRR Lungs: hacking cough after getting back from walking, diminshed, mildly increased WOB on 2 L Abdomen: Soft, nontender, nondistended  Neurological: Alert and oriented 3. Moves all extremities 4 with equal strength. Cranial nerves II through XII grossly intact. Extremities: No clubbing or cyanosis. No edema.   Data Reviewed: I have personally reviewed following labs and imaging studies  CBC: Recent Labs  Lab 09/16/22 1307 09/17/22 0325 09/18/22 0319 09/19/22 0738  WBC 40.0* 23.7* 23.4* 14.2*  NEUTROABS 36.8*  --   --   --   HGB 10.4* 9.0*  9.6* 9.1*  HCT 32.6* 30.7* 31.7* 29.2*  MCV 90.1 96.5 94.1 92.4  PLT 815* 622* 753* 727*    Basic Metabolic Panel: Recent Labs  Lab 09/16/22 1307 09/17/22 0325 09/18/22 0319  NA 129* 136 138  K 3.9 4.3 4.0  CL 92* 105 103  CO2 24 22 26   GLUCOSE 127* 168* 103*  BUN 12 14 13   CREATININE 0.74 0.53 0.52  CALCIUM 9.5 9.1 9.5  MG  --  1.7 1.7    GFR: Estimated Creatinine Clearance: 45.9 mL/min (by C-G formula based on SCr of 0.52 mg/dL).  Liver Function Tests: Recent Labs  Lab 09/16/22 1307  AST 19  ALT 17  ALKPHOS 109  BILITOT 0.5  PROT 6.8  ALBUMIN 2.8*    CBG: No results for input(s): "GLUCAP" in the last 168 hours.   Recent Results (from the past 240 hour(s))  Culture, blood (single)     Status: Abnormal (Preliminary result)   Collection Time: 09/16/22  2:08 PM   Specimen: BLOOD RIGHT FOREARM  Result Value Ref Range Status   Specimen Description   Final    BLOOD RIGHT FOREARM Performed at [facility], [address] [place],  [place] [address]    Special Requests   Final    BOTTLES DRAWN AEROBIC AND ANAEROBIC Blood Culture adequate volume Performed at [facility], [address], [place], [place] [address]    Culture  Setup Time   Final    GRAM POSITIVE COCCI IN BOTH AEROBIC AND ANAEROBIC BOTTLES CRITICAL RESULT CALLED TO, READ BACK BY AND VERIFIED WITH: PHARMD Earlean Shawl [phone] FCP    Culture (Najeeb Uptain)  Final    STREPTOCOCCUS PNEUMONIAE SUSCEPTIBILITIES TO FOLLOW Performed at [facility], [address] [place], [place] [address]    Report Status PENDING  Incomplete  Blood Culture ID Panel (Reflexed)     Status: Abnormal   Collection Time: 09/16/22  2:08 PM  Result Value Ref Range Status   Enterococcus faecalis NOT DETECTED NOT DETECTED Final   Enterococcus Faecium NOT DETECTED NOT DETECTED Final   Listeria monocytogenes NOT DETECTED NOT DETECTED Final   Staphylococcus species NOT DETECTED NOT DETECTED Final   Staphylococcus aureus (BCID) NOT DETECTED NOT DETECTED Final   Staphylococcus epidermidis NOT DETECTED NOT DETECTED Final   Staphylococcus lugdunensis NOT DETECTED NOT DETECTED Final   Streptococcus species DETECTED (Marranda Arakelian) NOT DETECTED Final    Comment: CRITICAL RESULT CALLED TO, READ BACK BY AND VERIFIED WITH: PHARMD J. GADHIA [phone] FCP    Streptococcus agalactiae NOT DETECTED NOT DETECTED Final   Streptococcus pneumoniae DETECTED (Greggory Safranek) NOT DETECTED Final    Comment: CRITICAL RESULT CALLED TO, READ BACK BY AND VERIFIED WITH: PHARMD J. GADHIA [phone] FCP    Streptococcus pyogenes NOT DETECTED NOT DETECTED Final   Quintara Bost.calcoaceticus-baumannii NOT DETECTED NOT DETECTED Final   Bacteroides fragilis NOT DETECTED NOT DETECTED Final   Enterobacterales NOT DETECTED NOT DETECTED Final   Enterobacter cloacae complex NOT DETECTED NOT DETECTED Final   Escherichia coli NOT DETECTED NOT DETECTED Final   Klebsiella aerogenes NOT DETECTED NOT DETECTED Final   Klebsiella oxytoca NOT  DETECTED NOT DETECTED Final   Klebsiella pneumoniae NOT DETECTED NOT DETECTED Final   Proteus species NOT DETECTED NOT DETECTED Final   Salmonella species NOT DETECTED NOT DETECTED Final   Serratia marcescens NOT DETECTED NOT DETECTED Final   Haemophilus influenzae NOT DETECTED NOT DETECTED Final   Neisseria meningitidis NOT DETECTED  NOT DETECTED Final   Pseudomonas aeruginosa NOT DETECTED NOT DETECTED Final   Stenotrophomonas maltophilia NOT DETECTED NOT DETECTED Final   Candida albicans NOT DETECTED NOT DETECTED Final   Candida auris NOT DETECTED NOT DETECTED Final   Candida glabrata NOT DETECTED NOT DETECTED Final   Candida krusei NOT DETECTED NOT DETECTED Final   Candida parapsilosis NOT DETECTED NOT DETECTED Final   Candida tropicalis NOT DETECTED NOT DETECTED Final   Cryptococcus neoformans/gattii NOT DETECTED NOT DETECTED Final    Comment: Performed at [facility], [address] [place], [place] [address]  MRSA Next Gen by PCR, Nasal     Status: None   Collection Time: 09/16/22  5:38 PM   Specimen: Nasal Mucosa; Nasal Swab  Result Value Ref Range Status   MRSA by PCR Next Gen NOT DETECTED NOT DETECTED Final    Comment: (NOTE) The GeneXpert MRSA Assay (FDA approved for NASAL specimens only), is one component of Latanya Hemmer comprehensive MRSA colonization surveillance program. It is not intended to diagnose MRSA infection nor to guide or monitor treatment for MRSA infections. Test performance is not FDA approved in patients less than 27 years old. Performed at [facility], [address], [place], [place] [address]   Respiratory (~20 pathogens) panel by PCR     Status: Abnormal   Collection Time: 09/16/22  6:47 PM   Specimen: Nasopharyngeal Swab; Respiratory  Result Value Ref Range Status   Adenovirus NOT DETECTED NOT DETECTED Final   Coronavirus 229E NOT DETECTED NOT DETECTED Final    Comment: (NOTE) The Coronavirus on the Respiratory Panel, DOES NOT  test for the novel  Coronavirus (2019 nCoV)    Coronavirus HKU1 NOT DETECTED NOT DETECTED Final   Coronavirus NL63 NOT DETECTED NOT DETECTED Final   Coronavirus OC43 NOT DETECTED NOT DETECTED Final   Metapneumovirus NOT DETECTED NOT DETECTED Final   Rhinovirus / Enterovirus DETECTED (Amalea Ottey) NOT DETECTED Final   Influenza Huber Mathers NOT DETECTED NOT DETECTED Final   Influenza B NOT DETECTED NOT DETECTED Final   Parainfluenza Virus 1 NOT DETECTED NOT DETECTED Final   Parainfluenza Virus 2 NOT DETECTED NOT DETECTED Final   Parainfluenza Virus 3 NOT DETECTED NOT DETECTED Final   Parainfluenza Virus 4 NOT DETECTED NOT DETECTED Final   Respiratory Syncytial Virus NOT DETECTED NOT DETECTED Final   Bordetella pertussis NOT DETECTED NOT DETECTED Final   Bordetella Parapertussis NOT DETECTED NOT DETECTED Final   Chlamydophila pneumoniae NOT DETECTED NOT DETECTED Final   Mycoplasma pneumoniae NOT DETECTED NOT DETECTED Final    Comment: Performed at [facility], [address] [place], [place] [address]  Culture, blood (single) w Reflex to ID Panel     Status: None (Preliminary result)   Collection Time: 09/16/22  7:14 PM   Specimen: BLOOD  Result Value Ref Range Status   Specimen Description   Final    BLOOD BLOOD RIGHT ARM Performed at [facility], [address] [place], [place] [address]    Special Requests   Final    BOTTLES DRAWN AEROBIC ONLY Blood Culture adequate volume Performed at [facility], [address] [place], [place] [address]    Culture   Final    NO GROWTH 3 DAYS Performed at [facility], [address], [place], [place] [address]    Report Status PENDING  Incomplete  Culture, blood (single) w Reflex to ID Panel     Status: None (Preliminary  result)   Collection Time: 09/16/22  7:18 PM   Specimen: BLOOD  Result Value Ref Range Status   Specimen Description   Final    BLOOD BLOOD LEFT HAND Performed at [facility], [address], [place], [place] [address]    Special Requests   Final    BOTTLES DRAWN AEROBIC ONLY Blood Culture adequate volume Performed at [facility], [address], [place], [place] [address]    Culture   Final    NO GROWTH 3 DAYS Performed at [facility], [address] [place], [place] [address]    Report Status PENDING  Incomplete  Culture, blood (Routine X 2) w Reflex to ID Panel     Status: None (Preliminary result)   Collection Time: 09/18/22  3:33 PM   Specimen: BLOOD LEFT HAND  Result Value Ref Range Status   Specimen Description   Final    BLOOD LEFT HAND Performed at [facility], [address] [place], [place] [address]    Special Requests   Final    AEROBIC BOTTLE ONLY Blood Culture adequate volume Performed at [facility], [address], [place], [place] [address]    Culture   Final    NO GROWTH < 24 HOURS Performed at [facility], [address], [place], [place] [address]    Report Status PENDING  Incomplete  Culture, blood (Routine X 2) w Reflex to ID Panel     Status: None (Preliminary result)   Collection Time: 09/18/22  3:43 PM   Specimen: BLOOD LEFT HAND  Result Value Ref Range Status   Specimen Description   Final    BLOOD LEFT HAND Performed at [facility], [address], [place], [place] [address]    Special Requests   Final    AEROBIC BOTTLE ONLY Blood Culture adequate volume Performed at [facility], [address], [place], [place] [address]    Culture   Final    NO GROWTH < 24 HOURS Performed at [facility], [address], [place], [place] [address]    Report Status PENDING  Incomplete         Radiology Studies: ECHOCARDIOGRAM COMPLETE  Result Date: 09/17/2022    ECHOCARDIOGRAM REPORT   Patient Name:   Kayla [name] Date of Exam: 09/17/2022 Medical Rec #:  [id number]       Height:       63.0 in  Accession #:    [id number]      Weight:       98.8 lb Date of Birth:  10/16/1950        BSA:          1.433 m Patient Age:    72 years        BP:           97/78 mmHg Patient Gender: F               HR:           73 bpm. Exam Location:  Inpatient Procedure: 2D Echo, Cardiac Doppler and Color Doppler Indications:    Bacteremia  History:        Patient has prior history of Echocardiogram examinations, most                 recent 01/28/2022. COPD and MS, hx cancer,  Signs/Symptoms:Shortness of Breath and Bacteremia; Risk                 Factors:Sleep Apnea.  Sonographer:    Wallie Char Referring Phys: [phone] Jadeyn Hargett CALDWELL POWELL JR  Sonographer Comments: Image acquisition challenging due to COPD and Challenging image acquisition due to pt coughing throughout entire exam. IMPRESSIONS  1. Left ventricular ejection fraction, by estimation, is 60 to 65%. Left ventricular ejection fraction by 2D MOD biplane is 63.1 %. The left ventricle has normal function. The left ventricle has no regional wall motion abnormalities. Left ventricular diastolic parameters are consistent with Grade I diastolic dysfunction (impaired relaxation).  2. Right ventricular systolic function is normal. The right ventricular size is normal. There is normal pulmonary artery systolic pressure. The estimated right ventricular systolic pressure is 33.0 mmHg.  3. The mitral valve is grossly normal. Trivial mitral valve regurgitation. No evidence of mitral stenosis.  4. The aortic valve is tricuspid. Aortic valve regurgitation is not visualized. No aortic stenosis is present.  5. The inferior vena cava is normal in size with greater than 50% respiratory variability, suggesting right atrial pressure of 3 mmHg. Conclusion(s)/Recommendation(s): No evidence of valvular vegetations on this transthoracic echocardiogram. Consider Nilaya Bouie transesophageal echocardiogram to exclude infective endocarditis if clinically indicated. FINDINGS  Left Ventricle: Left  ventricular ejection fraction, by estimation, is 60 to 65%. Left ventricular ejection fraction by 2D MOD biplane is 63.1 %. The left ventricle has normal function. The left ventricle has no regional wall motion abnormalities. The left ventricular internal cavity size was normal in size. There is no left ventricular hypertrophy. Left ventricular diastolic parameters are consistent with Grade I diastolic dysfunction (impaired relaxation). Right Ventricle: The right ventricular size is normal. No increase in right ventricular wall thickness. Right ventricular systolic function is normal. There is normal pulmonary artery systolic pressure. The tricuspid regurgitant velocity is 2.74 m/s, and  with an assumed right atrial pressure of 3 mmHg, the estimated right ventricular systolic pressure is 33.0 mmHg. Left Atrium: Left atrial size was normal in size. Right Atrium: Right atrial size was normal in size. Pericardium: There is no evidence of pericardial effusion. Mitral Valve: The mitral valve is grossly normal. Trivial mitral valve regurgitation. No evidence of mitral valve stenosis. MV peak gradient, 4.7 mmHg. The mean mitral valve gradient is 2.0 mmHg. Tricuspid Valve: The tricuspid valve is grossly normal. Tricuspid valve regurgitation is mild . No evidence of tricuspid stenosis. Aortic Valve: The aortic valve is tricuspid. Aortic valve regurgitation is not visualized. No aortic stenosis is present. Aortic valve mean gradient measures 5.0 mmHg. Aortic valve peak gradient measures 8.2 mmHg. Aortic valve area, by VTI measures 1.81 cm. Pulmonic Valve: The pulmonic valve was grossly normal. Pulmonic valve regurgitation is not visualized. No evidence of pulmonic stenosis. Aorta: The aortic root and ascending aorta are structurally normal, with no evidence of dilitation. Venous: The inferior vena cava is normal in size with greater than 50% respiratory variability, suggesting right atrial pressure of 3 mmHg. IAS/Shunts: The  atrial septum is grossly normal.  LEFT VENTRICLE PLAX 2D                        Biplane EF (MOD) LVIDd:         3.80 cm         LV Biplane EF:   Left LVIDs:         2.70 cm  ventricular LV PW:         0.80 cm                          ejection LV IVS:        0.80 cm                          fraction by LVOT diam:     1.70 cm                          2D MOD LV SV:         55                               biplane is LV SV Index:   38                               63.1 %. LVOT Area:     2.27 cm                                Diastology                                LV e' medial:    5.58 cm/s LV Volumes (MOD)               LV E/e' medial:  13.9 LV vol d, MOD    61.1 ml       LV e' lateral:   8.64 cm/s A2C:                           LV E/e' lateral: 9.0 LV vol d, MOD    54.0 ml A4C: LV vol s, MOD    23.4 ml A2C: LV vol s, MOD    20.2 ml A4C: LV SV MOD A2C:   37.7 ml LV SV MOD A4C:   54.0 ml LV SV MOD BP:    37.5 ml RIGHT VENTRICLE             IVC RV Basal diam:  3.20 cm     IVC diam: 2.00 cm RV S prime:     13.10 cm/s TAPSE (M-mode): 2.3 cm LEFT ATRIUM             Index        RIGHT ATRIUM           Index LA diam:        2.60 cm 1.81 cm/m   RA Area:     11.70 cm LA Vol (A2C):   24.0 ml 16.75 ml/m  RA Volume:   26.70 ml  18.64 ml/m LA Vol (A4C):   19.3 ml 13.47 ml/m LA Biplane Vol: 21.8 ml 15.22 ml/m  AORTIC VALVE AV Area (Vmax):    1.78 cm AV Area (Vmean):   1.70 cm AV Area (VTI):     1.81 cm AV Vmax:           143.50 cm/s AV Vmean:          106.100 cm/s AV VTI:  0.304 m AV Peak Grad:      8.2 mmHg AV Mean Grad:      5.0 mmHg LVOT Vmax:         112.50 cm/s LVOT Vmean:        79.450 cm/s LVOT VTI:          0.242 m LVOT/AV VTI ratio: 0.80  AORTA Ao Root diam: 2.90 cm Ao Asc diam:  3.20 cm MITRAL VALVE                TRICUSPID VALVE MV Area (PHT): 3.01 cm     TR Peak grad:   30.0 mmHg MV Area VTI:   2.12 cm     TR Vmax:        274.00 cm/s MV Peak grad:  4.7 mmHg MV Mean grad:   2.0 mmHg     SHUNTS MV Vmax:       1.08 m/s     Systemic VTI:  0.24 m MV Vmean:      59.8 cm/s    Systemic Diam: 1.70 cm MV Decel Time: 252 msec MV E velocity: 77.40 cm/s MV Haevyn Ury velocity: 102.00 cm/s MV E/Takiyah Bohnsack ratio:  0.76 Lennie Odor MD Electronically signed by Lennie Odor MD Signature Date/Time: 09/17/2022/4:16:58 PM    Final         Scheduled Meds:  arformoterol  15 mcg Nebulization BID   budesonide (PULMICORT) nebulizer solution  0.25 mg Nebulization BID   Chlorhexidine Gluconate Cloth  6 each Topical Daily   enoxaparin (LOVENOX) injection  30 mg Subcutaneous Q24H   feeding supplement  237 mL Oral TID BM   FLUoxetine  40 mg Oral Daily   guaiFENesin  600 mg Oral BID   levothyroxine  100 mcg Oral Q0600   mirabegron ER  50 mg Oral Daily   mirtazapine  15 mg Oral QHS   multivitamin with minerals  1 tablet Oral Daily   pantoprazole  40 mg Oral Daily   revefenacin  175 mcg Nebulization Daily   tamsulosin  0.4 mg Oral Daily   temazepam  15 mg Oral QHS   Continuous Infusions:  sodium chloride 10 mL/hr at 09/19/22 0733   cefTRIAXone (ROCEPHIN)  IV Stopped (09/18/22 2212)   vancomycin Stopped (09/18/22 0540)     LOS: 3 days    Time spent: over 30 min    Lacretia Nicks, MD [facility]   To contact the attending provider between 7A-7P or the covering provider during after hours 7P-7A, please log into the web site [email or web address] and access using universal [place] password for that web site. If you do not have the password, please call the hospital operator.  09/19/2022, 10:17 AM

## 2022-09-20 DIAGNOSIS — J189 Pneumonia, unspecified organism: Secondary | ICD-10-CM | POA: Diagnosis not present

## 2022-09-20 LAB — CULTURE, BLOOD (ROUTINE X 2): Culture: NO GROWTH

## 2022-09-20 LAB — CULTURE, BLOOD (SINGLE)

## 2022-09-20 NOTE — Progress Notes (Signed)
PROGRESS NOTE    Kayla [name]  UJW:[id number] DOB: [birth year] DOA: 09/16/2022 PCP: [name], MD  Chief Complaint  Patient presents with   Shortness of Breath    Brief Narrative:   Kayla [name] is Kayla [name] 72 y.o. female with medical history significant for multiple sclerosis, bilateral vocal cord immobility, immunodeficiency on IVIG, hypothyroidism, GERD, recently admitted to [facility] from 6/9 to 6/13 for aspiration pneumonia, right lower lobe multifocal mucous plugging, rhinovirus positive and also found to have strep pneumo bacteremia.  She was treated in the hospital, weaned to room air, and discharged home to complete 10 more days of p.o. Ceftin.  Patient states she did not really improve after leaving the hospital.  She was having some side effects, so cefdinir was stopped on day 8 by her PCP.  She went to her outpatient pulmonology follow-up today, was noted to have sats of 84%, EMS was called, given her bronchodilator and brought to the emergency department.   ED Course: Patient saturating 99% on 4 L nasal cannula on arrival to the ER, tachycardic heart rate 102.  Blood pressure 113/51.  Lab work shows WBC 40 K, hemoglobin 10 and stable, platelets 815.  Hyponatremia 129, electrolytes and renal function at normal baseline.  Chest x-ray demonstrates small bilateral pleural effusion, bilateral pneumonia right sided consolidation slightly worse than the left.  Patient was given empiric IV vancomycin, and IV cefepime, started on IV fluids, and hospitalist was contacted for admission.  Pulmonology was also consulted prior to hospitalist.  Assessment & Plan:   Active Problems:   Acute on chronic respiratory failure with hypoxia (HCC)   HCAP (healthcare-associated pneumonia)  Kayla [name] is Kayla [name] 72 y.o. female with medical history significant for multiple sclerosis, bilateral vocal cord immobility, immunodeficiency on IVIG, hypothyroidism, GERD, recent hospital stay for strep pneumo  bacteremia and pneumonia being admitted to the hospital with continued and worsening hypoxic respiratory failure and healthcare acquired pneumonia.   Acute hypoxic respiratory failure  Community Acquired Pneumonia Recurrent vs Nonresolving Pneumonia Rhinovirus Infection  -pulmonary c/s, appreciate recs --RVP with rhinovirus (this was present 2 weeks ago, unclear significance) --sputum cx, urine strep, urine leginella -- strep pneumoniae bacteremia (see below) - wean O2 as tolerated -- broad spectrum abx (narrow as appropriate)  -- scheduled and prn nebs -- flutter valve -- strict I/O, daily weights    Strep Pneumoniae Bacteremia  Recent Strep Pneumo Bacteremia Recent strep pneumo bacteremia, concerning for recurrent or inadequately treated bacteremia Strep pneumoniae bacteremia 6/25, susceptibilities reviewed Appreciate ID consult -> ceftriaxone, follow repeat cultures, TEE Monday (with intubation and mechanical ventilation per pulm) Echo with normal EF, grade 1 diastolic dysfunction, no evidence of valvular vegetations  Hyponatremia-resolved, follow    Depression-Prozac, remeron   GERD-Protonix p.o.   Hypothyroidism-Synthroid 100 mcg p.o. daily   History of immune deficiency, with IVIG therapy- due to MS immunomodulatory therapy.  Followed by Dr. Livingston Diones with [facility] in [place], [place].  has been off of IVIG since hospitalization for pubic ramus fracture in March.   Needs to reestablish outpatient for resumption  Anemia- gradual downtrend over last few months Iron (wnl), folate (wnl), b12 (elevated), ferritin (wnl) -> suggestive of chronic disease, will continue to trend   Insomnia- restoril, remeron at bedtime  Overactive Bladder Myrbetriq  Urinary Retention Self cath's at home, has indwelling foley in at this time - will remove at discharge  Multiple Sclerosis Followed by neurologist in [place], Dr. Konrad Felix per previous  note  Bilateral  Vocal Cord Immobility with Small Transglottal Airway Followed by Dr. Harriette Ohara with ENT at [facility]    DVT prophylaxis: lovenox Code Status: full Family Communication: none Disposition:   Status is: Inpatient Remains inpatient appropriate because: need for continued inpatient care   Consultants:  PCCM  Procedures:  none  Antimicrobials:  Anti-infectives (From admission, onward)    Start     Dose/Rate Route Frequency Ordered Stop   09/18/22 0400  vancomycin (VANCOCIN) IVPB 1000 mg/200 mL premix  Status:  Discontinued        1,000 mg 200 mL/hr over 60 Minutes Intravenous Every 36 hours 09/16/22 1809 09/19/22 1055   09/17/22 2200  cefTRIAXone (ROCEPHIN) 2 g in sodium chloride 0.9 % 100 mL IVPB        2 g 200 mL/hr over 30 Minutes Intravenous Every 24 hours 09/17/22 1047     09/17/22 0000  ceFEPIme (MAXIPIME) 2 g in sodium chloride 0.9 % 100 mL IVPB  Status:  Discontinued        2 g 200 mL/hr over 30 Minutes Intravenous Every 24 hours 09/16/22 1638 09/16/22 1639   09/17/22 0000  ceFEPIme (MAXIPIME) 500 mg in dextrose 5 % 50 mL IVPB  Status:  Discontinued        500 mg 110 mL/hr over 30 Minutes Intravenous Every 24 hours 09/16/22 1639 09/16/22 1800   09/16/22 2200  ceFEPIme (MAXIPIME) 2 g in sodium chloride 0.9 % 100 mL IVPB  Status:  Discontinued        2 g 200 mL/hr over 30 Minutes Intravenous Every 12 hours 09/16/22 1802 09/17/22 1047   09/16/22 1500  vancomycin (VANCOREADY) IVPB 750 mg/150 mL        750 mg 150 mL/hr over 60 Minutes Intravenous  Once 09/16/22 1447 09/16/22 1638   09/16/22 1500  ceFEPIme (MAXIPIME) 2 g in sodium chloride 0.9 % 100 mL IVPB        2 g 200 mL/hr over 30 Minutes Intravenous  Once 09/16/22 1451 09/16/22 1549   09/16/22 1445  ceFEPIme (MAXIPIME) 1 g in sodium chloride 0.9 % 100 mL IVPB  Status:  Discontinued        1 g 200 mL/hr over 30 Minutes Intravenous  Once 09/16/22 1438 09/16/22 1452       Subjective: Didn't  sleep well overnight  Objective: Vitals:   09/20/22 0834 09/20/22 0835 09/20/22 0900 09/20/22 0930  BP:      Pulse:   77 74  Resp:   18 (!) 24  Temp:      TempSrc:      SpO2: 95% 95% 95% 94%  Weight:      Height:        Intake/Output Summary (Last 24 hours) at 09/20/2022 0937 Last data filed at 09/20/2022 0932 Gross per 24 hour  Intake 808.42 ml  Output 2675 ml  Net -1866.58 ml   Filed Weights   09/16/22 1811 09/18/22 0500 09/20/22 0707  Weight: 44.8 kg 44.4 kg 45.2 kg    Examination:  General: No acute distress. Cardiovascular: RRR Lungs: unlabored Abdomen: Soft, nontender, nondistended with normal active bowel sounds. No masses. No hepatosplenomegaly. Neurological: Alert and oriented 3. Moves all extremities 4 with equal strength. Cranial nerves II through XII grossly intact. Extremities: No clubbing or cyanosis. No edema.   Data Reviewed: I have personally reviewed following labs and imaging studies  CBC: Recent Labs  Lab 09/16/22 1307 09/17/22 0325 09/18/22 0319  09/19/22 0738  WBC 40.0* 23.7* 23.4* 14.2*  NEUTROABS 36.8*  --   --   --   HGB 10.4* 9.0* 9.6* 9.1*  HCT 32.6* 30.7* 31.7* 29.2*  MCV 90.1 96.5 94.1 92.4  PLT 815* 622* 753* 727*    Basic Metabolic Panel: Recent Labs  Lab 09/16/22 1307 09/17/22 0325 09/18/22 0319  NA 129* 136 138  K 3.9 4.3 4.0  CL 92* 105 103  CO2 24 22 26   GLUCOSE 127* 168* 103*  BUN 12 14 13   CREATININE 0.74 0.53 0.52  CALCIUM 9.5 9.1 9.5  MG  --  1.7 1.7    GFR: Estimated Creatinine Clearance: 46.7 mL/min (by C-G formula based on SCr of 0.52 mg/dL).  Liver Function Tests: Recent Labs  Lab 09/16/22 1307  AST 19  ALT 17  ALKPHOS 109  BILITOT 0.5  PROT 6.8  ALBUMIN 2.8*    CBG: No results for input(s): "GLUCAP" in the last 168 hours.   Recent Results (from the past 240 hour(s))  Culture, blood (single)     Status: Abnormal   Collection Time: 09/16/22  2:08 PM   Specimen: BLOOD RIGHT FOREARM   Result Value Ref Range Status   Specimen Description   Final    BLOOD RIGHT FOREARM Performed at [facility], [address] [place], [place] [address]    Special Requests   Final    BOTTLES DRAWN AEROBIC AND ANAEROBIC Blood Culture adequate volume Performed at [facility] [place], [address] [place], [place] [address]    Culture  Setup Time   Final    GRAM POSITIVE COCCI IN BOTH AEROBIC AND ANAEROBIC BOTTLES CRITICAL RESULT CALLED TO, READ BACK BY AND VERIFIED WITH: Thana Ates [phone] [id number] FCP Performed at [facility], [address] [place], [place] [address]    Culture STREPTOCOCCUS PNEUMONIAE ([place])  Final   Report Status 09/19/2022 FINAL  Final   Organism ID, Bacteria STREPTOCOCCUS PNEUMONIAE  Final      Susceptibility   Streptococcus pneumoniae - MIC*    ERYTHROMYCIN >=8 RESISTANT Resistant     LEVOFLOXACIN 0.5 SENSITIVE Sensitive     VANCOMYCIN 0.5 SENSITIVE Sensitive     PENICILLIN (meningitis) 1 RESISTANT Resistant     PENO - penicillin 1      PENICILLIN (non-meningitis) 1 SENSITIVE Sensitive     PENICILLIN (oral) 1 INTERMEDIATE Intermediate     CEFTRIAXONE (non-meningitis) 0.5 SENSITIVE Sensitive     CEFTRIAXONE (meningitis) 0.5 SENSITIVE Sensitive     * STREPTOCOCCUS PNEUMONIAE  Blood Culture ID Panel (Reflexed)     Status: Abnormal   Collection Time: 09/16/22  2:08 PM  Result Value Ref Range Status   Enterococcus faecalis NOT DETECTED NOT DETECTED Final   Enterococcus Faecium NOT DETECTED NOT DETECTED Final   Listeria monocytogenes NOT DETECTED NOT DETECTED Final   Staphylococcus species NOT DETECTED NOT DETECTED Final   Staphylococcus aureus (BCID) NOT DETECTED NOT DETECTED Final   Staphylococcus epidermidis NOT DETECTED NOT DETECTED Final   Staphylococcus lugdunensis NOT DETECTED NOT DETECTED Final   Streptococcus species DETECTED (Shanai Lartigue) NOT DETECTED Final    Comment: CRITICAL RESULT CALLED TO, READ BACK BY AND  VERIFIED WITH: PHARMD J. GADHIA [phone] FCP    Streptococcus agalactiae NOT DETECTED NOT DETECTED Final   Streptococcus pneumoniae DETECTED (Antwonette Feliz) NOT DETECTED Final    Comment: CRITICAL RESULT CALLED TO, READ BACK BY AND VERIFIED WITH: PHARMD Earlean Shawl [phone] FCP  Streptococcus pyogenes NOT DETECTED NOT DETECTED Final   Claudie Brickhouse.calcoaceticus-baumannii NOT DETECTED NOT DETECTED Final   Bacteroides fragilis NOT DETECTED NOT DETECTED Final   Enterobacterales NOT DETECTED NOT DETECTED Final   Enterobacter cloacae complex NOT DETECTED NOT DETECTED Final   Escherichia coli NOT DETECTED NOT DETECTED Final   Klebsiella aerogenes NOT DETECTED NOT DETECTED Final   Klebsiella oxytoca NOT DETECTED NOT DETECTED Final   Klebsiella pneumoniae NOT DETECTED NOT DETECTED Final   Proteus species NOT DETECTED NOT DETECTED Final   Salmonella species NOT DETECTED NOT DETECTED Final   Serratia marcescens NOT DETECTED NOT DETECTED Final   Haemophilus influenzae NOT DETECTED NOT DETECTED Final   Neisseria meningitidis NOT DETECTED NOT DETECTED Final   Pseudomonas aeruginosa NOT DETECTED NOT DETECTED Final   Stenotrophomonas maltophilia NOT DETECTED NOT DETECTED Final   Candida albicans NOT DETECTED NOT DETECTED Final   Candida auris NOT DETECTED NOT DETECTED Final   Candida glabrata NOT DETECTED NOT DETECTED Final   Candida krusei NOT DETECTED NOT DETECTED Final   Candida parapsilosis NOT DETECTED NOT DETECTED Final   Candida tropicalis NOT DETECTED NOT DETECTED Final   Cryptococcus neoformans/gattii NOT DETECTED NOT DETECTED Final    Comment: Performed at [facility], [address] [place], [place] [address]  MRSA Next Gen by PCR, Nasal     Status: None   Collection Time: 09/16/22  5:38 PM   Specimen: Nasal Mucosa; Nasal Swab  Result Value Ref Range Status   MRSA by PCR Next Gen NOT DETECTED NOT DETECTED Final    Comment: (NOTE) The GeneXpert MRSA Assay (FDA approved for NASAL specimens  only), is one component of Jomayra Novitsky comprehensive MRSA colonization surveillance program. It is not intended to diagnose MRSA infection nor to guide or monitor treatment for MRSA infections. Test performance is not FDA approved in patients less than 3 years old. Performed at [facility], [address], [place], [place] [address]   Respiratory (~20 pathogens) panel by PCR     Status: Abnormal   Collection Time: 09/16/22  6:47 PM   Specimen: Nasopharyngeal Swab; Respiratory  Result Value Ref Range Status   Adenovirus NOT DETECTED NOT DETECTED Final   Coronavirus 229E NOT DETECTED NOT DETECTED Final    Comment: (NOTE) The Coronavirus on the Respiratory Panel, DOES NOT test for the novel  Coronavirus (2019 nCoV)    Coronavirus HKU1 NOT DETECTED NOT DETECTED Final   Coronavirus NL63 NOT DETECTED NOT DETECTED Final   Coronavirus OC43 NOT DETECTED NOT DETECTED Final   Metapneumovirus NOT DETECTED NOT DETECTED Final   Rhinovirus / Enterovirus DETECTED (Jazzalyn Loewenstein) NOT DETECTED Final   Influenza Davena Julian NOT DETECTED NOT DETECTED Final   Influenza B NOT DETECTED NOT DETECTED Final   Parainfluenza Virus 1 NOT DETECTED NOT DETECTED Final   Parainfluenza Virus 2 NOT DETECTED NOT DETECTED Final   Parainfluenza Virus 3 NOT DETECTED NOT DETECTED Final   Parainfluenza Virus 4 NOT DETECTED NOT DETECTED Final   Respiratory Syncytial Virus NOT DETECTED NOT DETECTED Final   Bordetella pertussis NOT DETECTED NOT DETECTED Final   Bordetella Parapertussis NOT DETECTED NOT DETECTED Final   Chlamydophila pneumoniae NOT DETECTED NOT DETECTED Final   Mycoplasma pneumoniae NOT DETECTED NOT DETECTED Final    Comment: Performed at [facility], [address], [place], [place] [address]  Culture, blood (single) w Reflex to ID Panel     Status: None (Preliminary result)   Collection Time: 09/16/22  7:14 PM  Specimen: BLOOD  Result Value Ref Range Status   Specimen Description   Final    BLOOD BLOOD  RIGHT ARM Performed at [facility], [address] [place], [place] [address]    Special Requests   Final    BOTTLES DRAWN AEROBIC ONLY Blood Culture adequate volume Performed at [facility], [address] [place], [place] [address]    Culture   Final    NO GROWTH 4 DAYS Performed at [facility], [address], [place], [place] [address]    Report Status PENDING  Incomplete  Culture, blood (single) w Reflex to ID Panel     Status: None (Preliminary result)   Collection Time: 09/16/22  7:18 PM   Specimen: BLOOD  Result Value Ref Range Status   Specimen Description   Final    BLOOD BLOOD LEFT HAND Performed at [facility], [address], [place], [place] [address]    Special Requests   Final    BOTTLES DRAWN AEROBIC ONLY Blood Culture adequate volume Performed at [facility], [address], [place], [place] [address]    Culture   Final    NO GROWTH 4 DAYS Performed at [facility], [address] [place], [place] [address]    Report Status PENDING  Incomplete  Culture, blood (Routine X 2) w Reflex to ID Panel     Status: None (Preliminary result)   Collection Time: 09/18/22  3:33 PM   Specimen: BLOOD LEFT HAND  Result Value Ref Range Status   Specimen Description   Final    BLOOD LEFT HAND Performed at [facility], [address], [place], [place] [address]    Special Requests   Final    AEROBIC BOTTLE ONLY Blood Culture adequate volume Performed at [facility], [address], [place], [place] [address]    Culture   Final    NO GROWTH 2 DAYS Performed at [facility], [address], [place], [place] [address]    Report Status PENDING  Incomplete  Culture, blood (Routine X 2) w Reflex to ID Panel     Status: None (Preliminary result)   Collection Time: 09/18/22  3:43 PM   Specimen: BLOOD LEFT HAND  Result Value Ref Range  Status   Specimen Description   Final    BLOOD LEFT HAND Performed at [facility], [address] [place], [place] [address]    Special Requests   Final    AEROBIC BOTTLE ONLY Blood Culture adequate volume Performed at [facility], [address] [place], [place] [address]    Culture   Final    NO GROWTH 2 DAYS Performed at [facility], [address] [place], [place] [address]    Report Status PENDING  Incomplete         Radiology Studies: No results found.      Scheduled Meds:  arformoterol  15 mcg Nebulization BID   budesonide (PULMICORT) nebulizer solution  0.25 mg Nebulization BID   Chlorhexidine Gluconate Cloth  6 each Topical Daily   enoxaparin (LOVENOX) injection  30 mg Subcutaneous Q24H   feeding supplement  237 mL Oral TID BM   FLUoxetine  40 mg Oral Daily   guaiFENesin  600 mg Oral BID   levothyroxine  100 mcg Oral Q0600   mirabegron ER  50 mg Oral Daily   mirtazapine  15 mg Oral QHS   multivitamin with minerals  1 tablet Oral Daily   pantoprazole  40 mg Oral Daily   revefenacin  175 mcg Nebulization Daily   tamsulosin  0.4 mg Oral Daily   temazepam  15 mg Oral QHS   Continuous Infusions:  sodium chloride Stopped (09/20/22 0904)   cefTRIAXone (ROCEPHIN)  IV Stopped (09/19/22 2146)     LOS: 4 days    Time spent: over 30 min    Lacretia Nicks, MD [facility]   To contact the attending provider between 7A-7P or the covering provider during after hours 7P-7A, please log into the web site [email or web address] and access using universal [place] password for that web site. If you do not have the password, please call the hospital operator.  09/20/2022, 9:37 AM

## 2022-09-20 NOTE — Progress Notes (Signed)
Report called to Ewing Schlein RN. All questions answered at this time. All patient belongings and paper chart transported with patient. Patient was transferred, in the bed, by RN and NT on tele and O2. Handoff completed.

## 2022-09-20 NOTE — Progress Notes (Signed)
   09/20/22 0502  Chest Physiotherapy Tx  $ Chest Physiotherapy (CPT)  (CPT Held at this time. Pt is finally resting.)

## 2022-09-20 NOTE — Progress Notes (Signed)
09/20/2022 No issues overnight. Given stability will check in on again Monday.  Please reach out if any questions or concerns, securechat sent to primary.  Myrla Halsted MD PCCM

## 2022-09-21 DIAGNOSIS — J189 Pneumonia, unspecified organism: Secondary | ICD-10-CM | POA: Diagnosis not present

## 2022-09-21 LAB — CBC WITH DIFFERENTIAL/PLATELET
Abs Immature Granulocytes: 0.45 10*3/uL — ABNORMAL HIGH (ref 0.00–0.07)
Basophils Absolute: 0.1 10*3/uL (ref 0.0–0.1)
Basophils Relative: 1 %
Eosinophils Absolute: 0.5 10*3/uL (ref 0.0–0.5)
Eosinophils Relative: 4 %
HCT: 28.9 % — ABNORMAL LOW (ref 36.0–46.0)
Hemoglobin: 8.9 g/dL — ABNORMAL LOW (ref 12.0–15.0)
Immature Granulocytes: 4 %
Lymphocytes Relative: 18 %
Lymphs Abs: 2.3 10*3/uL (ref 0.7–4.0)
MCH: 28.5 pg (ref 26.0–34.0)
MCHC: 30.8 g/dL (ref 30.0–36.0)
MCV: 92.6 fL (ref 80.0–100.0)
Monocytes Absolute: 1.1 10*3/uL — ABNORMAL HIGH (ref 0.1–1.0)
Monocytes Relative: 8 %
Neutro Abs: 8.6 10*3/uL — ABNORMAL HIGH (ref 1.7–7.7)
Neutrophils Relative %: 65 %
Platelets: 655 10*3/uL — ABNORMAL HIGH (ref 150–400)
RBC: 3.12 MIL/uL — ABNORMAL LOW (ref 3.87–5.11)
RDW: 14.8 % (ref 11.5–15.5)
WBC: 12.9 10*3/uL — ABNORMAL HIGH (ref 4.0–10.5)
nRBC: 0 % (ref 0.0–0.2)

## 2022-09-21 LAB — COMPREHENSIVE METABOLIC PANEL
ALT: 18 U/L (ref 0–44)
AST: 17 U/L (ref 15–41)
Albumin: 2.2 g/dL — ABNORMAL LOW (ref 3.5–5.0)
Alkaline Phosphatase: 65 U/L (ref 38–126)
Anion gap: 9 (ref 5–15)
BUN: 11 mg/dL (ref 8–23)
CO2: 30 mmol/L (ref 22–32)
Calcium: 8.9 mg/dL (ref 8.9–10.3)
Chloride: 96 mmol/L — ABNORMAL LOW (ref 98–111)
Creatinine, Ser: 0.48 mg/dL (ref 0.44–1.00)
GFR, Estimated: >60 mL/min (ref >60)
Glucose, Bld: 103 mg/dL — ABNORMAL HIGH (ref 70–99)
Potassium: 4 mmol/L (ref 3.5–5.1)
Sodium: 135 mmol/L (ref 135–145)
Total Bilirubin: 0.1 mg/dL — ABNORMAL LOW (ref 0.3–1.2)
Total Protein: 5.1 g/dL — ABNORMAL LOW (ref 6.5–8.1)

## 2022-09-21 LAB — CULTURE, BLOOD (ROUTINE X 2)
Special Requests: ADEQUATE
Special Requests: ADEQUATE

## 2022-09-21 LAB — CULTURE, BLOOD (SINGLE)
Culture: NO GROWTH
Special Requests: ADEQUATE
Special Requests: ADEQUATE

## 2022-09-21 LAB — MAGNESIUM: Magnesium: 1.8 mg/dL (ref 1.7–2.4)

## 2022-09-21 LAB — PHOSPHORUS: Phosphorus: 3.7 mg/dL (ref 2.5–4.6)

## 2022-09-21 MED ORDER — SODIUM CHLORIDE 3 % IN NEBU
4.0000 mL | INHALATION_SOLUTION | RESPIRATORY_TRACT | Status: AC | PRN
  Filled 2022-09-21: qty 4

## 2022-09-21 MED ORDER — ACETAMINOPHEN 325 MG PO TABS
650.0000 mg | ORAL_TABLET | Freq: Four times a day (QID) | ORAL | Status: DC | PRN
  Administered 2022-09-21: 650 mg via ORAL
  Filled 2022-09-21: qty 2

## 2022-09-21 NOTE — Progress Notes (Signed)
Mobility Specialist - Progress Note   09/21/22 1214  Mobility  Activity Ambulated with assistance in hallway (PT)  Level of Assistance Standby assist, set-up cues, supervision of patient - no hands on  Assistive Device Front wheel walker  Distance Ambulated (ft) 70 ft  Range of Motion/Exercises Active  Activity Response Tolerated well  Mobility Referral Yes  $Mobility charge 1 Mobility  Mobility Specialist Start Time (ACUTE ONLY) 1215  Mobility Specialist Stop Time (ACUTE ONLY) 1227  Mobility Specialist Time Calculation (min) (ACUTE ONLY) 12 min   Pt received in bed and agreed to mobility. Had no issues throughout session. R foot drop, drags, been present for sometime now.   Returned to chair with all needs met alarm on.  Marilynne Halsted Mobility Specialist

## 2022-09-21 NOTE — Progress Notes (Signed)
PROGRESS NOTE    Kayla [name]  YQM:[id number] DOB: [birth year] DOA: 09/16/2022 PCP: [name], MD  Chief Complaint  Patient presents with   Shortness of Breath    Brief Narrative:   Kayla [name] is Dixie Coppa 72 y.o. female with medical history significant for multiple sclerosis, bilateral vocal cord immobility, immunodeficiency on IVIG, hypothyroidism, GERD, recently admitted to [facility] from 6/9 to 6/13 for aspiration pneumonia, right lower lobe multifocal mucous plugging, rhinovirus positive and also found to have strep pneumo bacteremia.  She was treated in the hospital, weaned to room air, and discharged home to complete 10 more days of p.o. Ceftin.  Patient states she did not really improve after leaving the hospital.  She was having some side effects, so cefdinir was stopped on day 8 by her PCP.  She went to her outpatient pulmonology follow-up today, was noted to have sats of 84%, EMS was called, given her bronchodilator and brought to the emergency department.   ED Course: Patient saturating 99% on 4 L nasal cannula on arrival to the ER, tachycardic heart rate 102.  Blood pressure 113/51.  Lab work shows WBC 40 K, hemoglobin 10 and stable, platelets 815.  Hyponatremia 129, electrolytes and renal function at normal baseline.  Chest x-ray demonstrates small bilateral pleural effusion, bilateral pneumonia right sided consolidation slightly worse than the left.  Patient was given empiric IV vancomycin, and IV cefepime, started on IV fluids, and hospitalist was contacted for admission.  Pulmonology was also consulted prior to hospitalist.  Assessment & Plan:   Active Problems:   Acute on chronic respiratory failure with hypoxia (HCC)   HCAP (healthcare-associated pneumonia)  CRETA BARTCH is Kayla [name] 72 y.o. female with medical history significant for multiple sclerosis, bilateral vocal cord immobility, immunodeficiency on IVIG, hypothyroidism, GERD, recent hospital stay for strep pneumo  bacteremia and pneumonia being admitted to the hospital with continued and worsening hypoxic respiratory failure and healthcare acquired pneumonia.   Acute hypoxic respiratory failure  Community Acquired Pneumonia Recurrent vs Nonresolving Pneumonia Rhinovirus Infection  -pulmonary c/s, appreciate recs --RVP with rhinovirus (this was present 2 weeks ago, unclear significance) --sputum cx, urine strep, urine leginella -- strep pneumoniae bacteremia (see below) - wean O2 as tolerated -- broad spectrum abx (narrow as appropriate)  -- scheduled and prn nebs -- flutter valve -- strict I/O, daily weights    Strep Pneumoniae Bacteremia  Recent Strep Pneumo Bacteremia Recent strep pneumo bacteremia, concerning for recurrent or inadequately treated bacteremia Strep pneumoniae bacteremia 6/25, susceptibilities reviewed Appreciate ID consult -> ceftriaxone, follow repeat cultures, TEE Monday (with intubation and mechanical ventilation per pulm) Echo with normal EF, grade 1 diastolic dysfunction, no evidence of valvular vegetations  Hyponatremia-resolved, follow    Depression-Prozac, remeron   GERD-Protonix p.o.   Hypothyroidism-Synthroid 100 mcg p.o. daily   History of immune deficiency, with IVIG therapy- due to MS immunomodulatory therapy.  Followed by Dr. Livingston Diones with [facility] in [place], [place].  has been off of IVIG since hospitalization for pubic ramus fracture in March.   Needs to reestablish outpatient for resumption  Anemia- gradual downtrend over last few months Iron (wnl), folate (wnl), b12 (elevated), ferritin (wnl) -> suggestive of chronic disease, will continue to trend   Insomnia- restoril, remeron at bedtime  Overactive Bladder Myrbetriq  Urinary Retention Self cath's at home, has indwelling foley in at this time - will remove at discharge  Multiple Sclerosis Followed by neurologist in [place], Dr. Konrad Felix per previous  note  Bilateral  Vocal Cord Immobility with Small Transglottal Airway Followed by Dr. Harriette Ohara with ENT at [facility]    DVT prophylaxis: lovenox Code Status: full Family Communication: none Disposition:   Status is: Inpatient Remains inpatient appropriate because: need for continued inpatient care   Consultants:  PCCM  Procedures:  none  Antimicrobials:  Anti-infectives (From admission, onward)    Start     Dose/Rate Route Frequency Ordered Stop   09/18/22 0400  vancomycin (VANCOCIN) IVPB 1000 mg/200 mL premix  Status:  Discontinued        1,000 mg 200 mL/hr over 60 Minutes Intravenous Every 36 hours 09/16/22 1809 09/19/22 1055   09/17/22 2200  cefTRIAXone (ROCEPHIN) 2 g in sodium chloride 0.9 % 100 mL IVPB        2 g 200 mL/hr over 30 Minutes Intravenous Every 24 hours 09/17/22 1047     09/17/22 0000  ceFEPIme (MAXIPIME) 2 g in sodium chloride 0.9 % 100 mL IVPB  Status:  Discontinued        2 g 200 mL/hr over 30 Minutes Intravenous Every 24 hours 09/16/22 1638 09/16/22 1639   09/17/22 0000  ceFEPIme (MAXIPIME) 500 mg in dextrose 5 % 50 mL IVPB  Status:  Discontinued        500 mg 110 mL/hr over 30 Minutes Intravenous Every 24 hours 09/16/22 1639 09/16/22 1800   09/16/22 2200  ceFEPIme (MAXIPIME) 2 g in sodium chloride 0.9 % 100 mL IVPB  Status:  Discontinued        2 g 200 mL/hr over 30 Minutes Intravenous Every 12 hours 09/16/22 1802 09/17/22 1047   09/16/22 1500  vancomycin (VANCOREADY) IVPB 750 mg/150 mL        750 mg 150 mL/hr over 60 Minutes Intravenous  Once 09/16/22 1447 09/16/22 1638   09/16/22 1500  ceFEPIme (MAXIPIME) 2 g in sodium chloride 0.9 % 100 mL IVPB        2 g 200 mL/hr over 30 Minutes Intravenous  Once 09/16/22 1451 09/16/22 1549   09/16/22 1445  ceFEPIme (MAXIPIME) 1 g in sodium chloride 0.9 % 100 mL IVPB  Status:  Discontinued        1 g 200 mL/hr over 30 Minutes Intravenous  Once 09/16/22 1438 09/16/22 1452       Subjective: No new  concerns  Objective: Vitals:   09/20/22 2110 09/21/22 0628 09/21/22 0732 09/21/22 1516  BP:  (!) 116/58  (!) 104/50  Pulse:  79  83  Resp:  18  20  Temp:  98.4 F (36.9 C)  97.8 F (36.6 C)  TempSrc:  Oral    SpO2: 99% 97% 95% 93%  Weight:  48.4 kg    Height:        Intake/Output Summary (Last 24 hours) at 09/21/2022 1718 Last data filed at 09/21/2022 0700 Gross per 24 hour  Intake --  Output 800 ml  Net -800 ml   Filed Weights   09/18/22 0500 09/20/22 0707 09/21/22 0628  Weight: 44.4 kg 45.2 kg 48.4 kg    Examination:  General: No acute distress. Cardiovascular: RRR Lungs: mildly tachypneic Abdomen: Soft, nontender, nondistended  Neurological: Alert and oriented 3. Moves all extremities 4. Cranial nerves II through XII grossly intact. Extremities: No clubbing or cyanosis. No edema  Data Reviewed: I have personally reviewed following labs and imaging studies  CBC: Recent Labs  Lab 09/16/22 1307 09/17/22 0325 09/18/22 0319 09/19/22 1610 09/21/22 0710  WBC 40.0* 23.7* 23.4* 14.2* 12.9*  NEUTROABS 36.8*  --   --   --  8.6*  HGB 10.4* 9.0* 9.6* 9.1* 8.9*  HCT 32.6* 30.7* 31.7* 29.2* 28.9*  MCV 90.1 96.5 94.1 92.4 92.6  PLT 815* 622* 753* 727* 655*    Basic Metabolic Panel: Recent Labs  Lab 09/16/22 1307 09/17/22 0325 09/18/22 0319 09/21/22 0710  NA 129* 136 138 135  K 3.9 4.3 4.0 4.0  CL 92* 105 103 96*  CO2 24 22 26 30   GLUCOSE 127* 168* 103* 103*  BUN 12 14 13 11   CREATININE 0.74 0.53 0.52 0.48  CALCIUM 9.5 9.1 9.5 8.9  MG  --  1.7 1.7 1.8  PHOS  --   --   --  3.7    GFR: Estimated Creatinine Clearance: 50 mL/min (by C-G formula based on SCr of 0.48 mg/dL).  Liver Function Tests: Recent Labs  Lab 09/16/22 1307 09/21/22 0710  AST 19 17  ALT 17 18  ALKPHOS 109 65  BILITOT 0.5 0.1*  PROT 6.8 5.1*  ALBUMIN 2.8* 2.2*    CBG: No results for input(s): "GLUCAP" in the last 168 hours.   Recent Results (from the past 240 hour(s))   Culture, blood (single)     Status: Abnormal   Collection Time: 09/16/22  2:08 PM   Specimen: BLOOD RIGHT FOREARM  Result Value Ref Range Status   Specimen Description   Final    BLOOD RIGHT FOREARM Performed at [facility], [address], [place], [place] [address]    Special Requests   Final    BOTTLES DRAWN AEROBIC AND ANAEROBIC Blood Culture adequate volume Performed at [facility], [address], [place], [place] [address]    Culture  Setup Time   Final    GRAM POSITIVE COCCI IN BOTH AEROBIC AND ANAEROBIC BOTTLES CRITICAL RESULT CALLED TO, READ BACK BY AND VERIFIED WITH: Thana Ates [phone] [id number] FCP Performed at [facility], [address], [place], [place] [address]    Culture STREPTOCOCCUS PNEUMONIAE (Irfan Veal)  Final   Report Status 09/19/2022 FINAL  Final   Organism ID, Bacteria STREPTOCOCCUS PNEUMONIAE  Final      Susceptibility   Streptococcus pneumoniae - MIC*    ERYTHROMYCIN >=8 RESISTANT Resistant     LEVOFLOXACIN 0.5 SENSITIVE Sensitive     VANCOMYCIN 0.5 SENSITIVE Sensitive     PENICILLIN (meningitis) 1 RESISTANT Resistant     PENO - penicillin 1      PENICILLIN (non-meningitis) 1 SENSITIVE Sensitive     PENICILLIN (oral) 1 INTERMEDIATE Intermediate     CEFTRIAXONE (non-meningitis) 0.5 SENSITIVE Sensitive     CEFTRIAXONE (meningitis) 0.5 SENSITIVE Sensitive     * STREPTOCOCCUS PNEUMONIAE  Blood Culture ID Panel (Reflexed)     Status: Abnormal   Collection Time: 09/16/22  2:08 PM  Result Value Ref Range Status   Enterococcus faecalis NOT DETECTED NOT DETECTED Final   Enterococcus Faecium NOT DETECTED NOT DETECTED Final   Listeria monocytogenes NOT DETECTED NOT DETECTED Final   Staphylococcus species NOT DETECTED NOT DETECTED Final   Staphylococcus aureus (BCID) NOT DETECTED NOT DETECTED Final   Staphylococcus epidermidis NOT DETECTED NOT DETECTED Final   Staphylococcus lugdunensis NOT DETECTED NOT DETECTED Final    Streptococcus species DETECTED (Maximino Cozzolino) NOT DETECTED Final    Comment: CRITICAL RESULT CALLED TO, READ BACK BY AND VERIFIED WITH: PHARMD Earlean Shawl [phone] FCP    Streptococcus agalactiae NOT DETECTED  NOT DETECTED Final   Streptococcus pneumoniae DETECTED (Orazio Weller) NOT DETECTED Final    Comment: CRITICAL RESULT CALLED TO, READ BACK BY AND VERIFIED WITH: PHARMD J. GADHIA [phone] FCP    Streptococcus pyogenes NOT DETECTED NOT DETECTED Final   Auda Finfrock.calcoaceticus-baumannii NOT DETECTED NOT DETECTED Final   Bacteroides fragilis NOT DETECTED NOT DETECTED Final   Enterobacterales NOT DETECTED NOT DETECTED Final   Enterobacter cloacae complex NOT DETECTED NOT DETECTED Final   Escherichia coli NOT DETECTED NOT DETECTED Final   Klebsiella aerogenes NOT DETECTED NOT DETECTED Final   Klebsiella oxytoca NOT DETECTED NOT DETECTED Final   Klebsiella pneumoniae NOT DETECTED NOT DETECTED Final   Proteus species NOT DETECTED NOT DETECTED Final   Salmonella species NOT DETECTED NOT DETECTED Final   Serratia marcescens NOT DETECTED NOT DETECTED Final   Haemophilus influenzae NOT DETECTED NOT DETECTED Final   Neisseria meningitidis NOT DETECTED NOT DETECTED Final   Pseudomonas aeruginosa NOT DETECTED NOT DETECTED Final   Stenotrophomonas maltophilia NOT DETECTED NOT DETECTED Final   Candida albicans NOT DETECTED NOT DETECTED Final   Candida auris NOT DETECTED NOT DETECTED Final   Candida glabrata NOT DETECTED NOT DETECTED Final   Candida krusei NOT DETECTED NOT DETECTED Final   Candida parapsilosis NOT DETECTED NOT DETECTED Final   Candida tropicalis NOT DETECTED NOT DETECTED Final   Cryptococcus neoformans/gattii NOT DETECTED NOT DETECTED Final    Comment: Performed at [facility], [address] [place], [place] [address]  MRSA Next Gen by PCR, Nasal     Status: None   Collection Time: 09/16/22  5:38 PM   Specimen: Nasal Mucosa; Nasal Swab  Result Value Ref Range Status   MRSA by PCR Next Gen  NOT DETECTED NOT DETECTED Final    Comment: (NOTE) The GeneXpert MRSA Assay (FDA approved for NASAL specimens only), is one component of Annie Roseboom comprehensive MRSA colonization surveillance program. It is not intended to diagnose MRSA infection nor to guide or monitor treatment for MRSA infections. Test performance is not FDA approved in patients less than 33 years old. Performed at [facility], [address] [place], [place] [address]   Respiratory (~20 pathogens) panel by PCR     Status: Abnormal   Collection Time: 09/16/22  6:47 PM   Specimen: Nasopharyngeal Swab; Respiratory  Result Value Ref Range Status   Adenovirus NOT DETECTED NOT DETECTED Final   Coronavirus 229E NOT DETECTED NOT DETECTED Final    Comment: (NOTE) The Coronavirus on the Respiratory Panel, DOES NOT test for the novel  Coronavirus (2019 nCoV)    Coronavirus HKU1 NOT DETECTED NOT DETECTED Final   Coronavirus NL63 NOT DETECTED NOT DETECTED Final   Coronavirus OC43 NOT DETECTED NOT DETECTED Final   Metapneumovirus NOT DETECTED NOT DETECTED Final   Rhinovirus / Enterovirus DETECTED (Rainee Sweatt) NOT DETECTED Final   Influenza Barbarann Kelly NOT DETECTED NOT DETECTED Final   Influenza B NOT DETECTED NOT DETECTED Final   Parainfluenza Virus 1 NOT DETECTED NOT DETECTED Final   Parainfluenza Virus 2 NOT DETECTED NOT DETECTED Final   Parainfluenza Virus 3 NOT DETECTED NOT DETECTED Final   Parainfluenza Virus 4 NOT DETECTED NOT DETECTED Final   Respiratory Syncytial Virus NOT DETECTED NOT DETECTED Final   Bordetella pertussis NOT DETECTED NOT DETECTED Final   Bordetella Parapertussis NOT DETECTED NOT DETECTED Final   Chlamydophila pneumoniae NOT DETECTED NOT DETECTED Final   Mycoplasma pneumoniae NOT DETECTED NOT DETECTED Final    Comment: Performed at [facility], [address], [place], [place] [address]  Culture, blood (single) w Reflex to ID Panel     Status: None   Collection Time: 09/16/22  7:14 PM    Specimen: BLOOD  Result Value Ref Range Status   Specimen Description   Final    BLOOD BLOOD RIGHT ARM Performed at [facility], [address], [place], [place] [address]    Special Requests   Final    BOTTLES DRAWN AEROBIC ONLY Blood Culture adequate volume Performed at [facility], [address] [place], [place] [address]    Culture   Final    NO GROWTH 5 DAYS Performed at [facility], [address], [place], [place] [address]    Report Status 09/21/2022 FINAL  Final  Culture, blood (single) w Reflex to ID Panel     Status: None   Collection Time: 09/16/22  7:18 PM   Specimen: BLOOD  Result Value Ref Range Status   Specimen Description   Final    BLOOD BLOOD LEFT HAND Performed at [facility] [address] [place], [place] [address]    Special Requests   Final    BOTTLES DRAWN AEROBIC ONLY Blood Culture adequate volume Performed at [facility], [address], [place], [place] [address]    Culture   Final    NO GROWTH 5 DAYS Performed at [facility], [address] [place], [place] [address]    Report Status 09/21/2022 FINAL  Final  Culture, blood (Routine X 2) w Reflex to ID Panel     Status: None (Preliminary result)   Collection Time: 09/18/22  3:33 PM   Specimen: BLOOD LEFT HAND  Result Value Ref Range Status   Specimen Description   Final    BLOOD LEFT HAND Performed at [facility], [address], [place], [place] [address]    Special Requests   Final    AEROBIC BOTTLE ONLY Blood Culture adequate volume Performed at [facility], [address] [place], [place] [address]    Culture   Final    NO GROWTH 3 DAYS Performed at [facility], [address], [place], [place] [address]    Report Status PENDING  Incomplete  Culture, blood (Routine X 2) w Reflex to ID Panel     Status: None (Preliminary result)   Collection  Time: 09/18/22  3:43 PM   Specimen: BLOOD LEFT HAND  Result Value Ref Range Status   Specimen Description   Final    BLOOD LEFT HAND Performed at [facility], [address] [place], [place] [address]    Special Requests   Final    AEROBIC BOTTLE ONLY Blood Culture adequate volume Performed at [facility], [address], [place], [place] [address]    Culture   Final    NO GROWTH 3 DAYS Performed at [facility], [address] [place], [place] [address]    Report Status PENDING  Incomplete         Radiology Studies: No results found.      Scheduled Meds:  arformoterol  15 mcg Nebulization BID   budesonide (PULMICORT) nebulizer solution  0.25 mg Nebulization BID   Chlorhexidine Gluconate Cloth  6 each Topical Daily   enoxaparin (LOVENOX) injection  30 mg Subcutaneous Q24H   feeding supplement  237 mL Oral TID BM  FLUoxetine  40 mg Oral Daily   guaiFENesin  600 mg Oral BID   levothyroxine  100 mcg Oral Q0600   mirabegron ER  50 mg Oral Daily   mirtazapine  15 mg Oral QHS   multivitamin with minerals  1 tablet Oral Daily   pantoprazole  40 mg Oral Daily   revefenacin  175 mcg Nebulization Daily   tamsulosin  0.4 mg Oral Daily   temazepam  15 mg Oral QHS   Continuous Infusions:  sodium chloride Stopped (09/20/22 0904)   cefTRIAXone (ROCEPHIN)  IV 2 g (09/20/22 2230)     LOS: 5 days    Time spent: over 30 min    Lacretia Nicks, MD [facility]   To contact the attending provider between 7A-7P or the covering provider during after hours 7P-7A, please log into the web site [email or web address] and access using universal [place] password for that web site. If you do not have the password, please call the hospital operator.  09/21/2022, 5:18 PM

## 2022-09-22 ENCOUNTER — Encounter (HOSPITAL_COMMUNITY): Payer: Self-pay | Admitting: Internal Medicine

## 2022-09-22 ENCOUNTER — Encounter (HOSPITAL_COMMUNITY)
Admission: EM | Disposition: A | Discharge status: Home / Self Care | Payer: Self-pay | Source: Home / Self Care | Attending: Family Medicine

## 2022-09-22 ENCOUNTER — Inpatient Hospital Stay (HOSPITAL_COMMUNITY): Payer: Medicare Other | Admitting: Anesthesiology

## 2022-09-22 ENCOUNTER — Inpatient Hospital Stay (HOSPITAL_COMMUNITY): Payer: Medicare Other

## 2022-09-22 DIAGNOSIS — E039 Hypothyroidism, unspecified: Secondary | ICD-10-CM

## 2022-09-22 DIAGNOSIS — I38 Endocarditis, valve unspecified: Secondary | ICD-10-CM

## 2022-09-22 DIAGNOSIS — R079 Chest pain, unspecified: Secondary | ICD-10-CM | POA: Diagnosis not present

## 2022-09-22 DIAGNOSIS — J189 Pneumonia, unspecified organism: Secondary | ICD-10-CM | POA: Diagnosis not present

## 2022-09-22 DIAGNOSIS — B999 Unspecified infectious disease: Secondary | ICD-10-CM

## 2022-09-22 DIAGNOSIS — R7881 Bacteremia: Secondary | ICD-10-CM | POA: Diagnosis not present

## 2022-09-22 DIAGNOSIS — B953 Streptococcus pneumoniae as the cause of diseases classified elsewhere: Secondary | ICD-10-CM | POA: Diagnosis not present

## 2022-09-22 DIAGNOSIS — J449 Chronic obstructive pulmonary disease, unspecified: Secondary | ICD-10-CM

## 2022-09-22 DIAGNOSIS — J13 Pneumonia due to Streptococcus pneumoniae: Secondary | ICD-10-CM

## 2022-09-22 DIAGNOSIS — I340 Nonrheumatic mitral (valve) insufficiency: Secondary | ICD-10-CM

## 2022-09-22 DIAGNOSIS — D801 Nonfamilial hypogammaglobulinemia: Secondary | ICD-10-CM

## 2022-09-22 HISTORY — PX: TEE WITHOUT CARDIOVERSION: SHX5443

## 2022-09-22 LAB — CBC WITH DIFFERENTIAL/PLATELET
Abs Immature Granulocytes: 0.4 10*3/uL — ABNORMAL HIGH (ref 0.00–0.07)
Basophils Absolute: 0.1 10*3/uL (ref 0.0–0.1)
Basophils Relative: 1 %
Eosinophils Absolute: 0.4 10*3/uL (ref 0.0–0.5)
Eosinophils Relative: 3 %
HCT: 28.8 % — ABNORMAL LOW (ref 36.0–46.0)
Hemoglobin: 8.8 g/dL — ABNORMAL LOW (ref 12.0–15.0)
Immature Granulocytes: 3 %
Lymphocytes Relative: 20 %
Lymphs Abs: 2.7 10*3/uL (ref 0.7–4.0)
MCH: 28.6 pg (ref 26.0–34.0)
MCHC: 30.6 g/dL (ref 30.0–36.0)
MCV: 93.5 fL (ref 80.0–100.0)
Monocytes Absolute: 1.1 10*3/uL — ABNORMAL HIGH (ref 0.1–1.0)
Monocytes Relative: 8 %
Neutro Abs: 8.8 10*3/uL — ABNORMAL HIGH (ref 1.7–7.7)
Neutrophils Relative %: 65 %
Platelets: 607 10*3/uL — ABNORMAL HIGH (ref 150–400)
RBC: 3.08 MIL/uL — ABNORMAL LOW (ref 3.87–5.11)
RDW: 15.3 % (ref 11.5–15.5)
WBC: 13.5 10*3/uL — ABNORMAL HIGH (ref 4.0–10.5)
nRBC: 0 % (ref 0.0–0.2)

## 2022-09-22 LAB — COMPREHENSIVE METABOLIC PANEL
ALT: 17 U/L (ref 0–44)
AST: 18 U/L (ref 15–41)
Albumin: 2.3 g/dL — ABNORMAL LOW (ref 3.5–5.0)
Alkaline Phosphatase: 63 U/L (ref 38–126)
Anion gap: 12 (ref 5–15)
BUN: 11 mg/dL (ref 8–23)
CO2: 31 mmol/L (ref 22–32)
Calcium: 9.4 mg/dL (ref 8.9–10.3)
Chloride: 95 mmol/L — ABNORMAL LOW (ref 98–111)
Creatinine, Ser: 0.57 mg/dL (ref 0.44–1.00)
GFR, Estimated: >60 mL/min (ref >60)
Glucose, Bld: 101 mg/dL — ABNORMAL HIGH (ref 70–99)
Potassium: 4.4 mmol/L (ref 3.5–5.1)
Sodium: 138 mmol/L (ref 135–145)
Total Bilirubin: 0.2 mg/dL — ABNORMAL LOW (ref 0.3–1.2)
Total Protein: 5.1 g/dL — ABNORMAL LOW (ref 6.5–8.1)

## 2022-09-22 LAB — PHOSPHORUS: Phosphorus: 3.4 mg/dL (ref 2.5–4.6)

## 2022-09-22 LAB — MAGNESIUM: Magnesium: 1.9 mg/dL (ref 1.7–2.4)

## 2022-09-22 LAB — CULTURE, BLOOD (ROUTINE X 2): Culture: NO GROWTH

## 2022-09-22 SURGERY — ECHOCARDIOGRAM, TRANSESOPHAGEAL
Anesthesia: Monitor Anesthesia Care

## 2022-09-22 MED ORDER — PROPOFOL 500 MG/50ML IV EMUL
INTRAVENOUS | Status: DC | PRN
  Administered 2022-09-22: 100 ug/kg/min via INTRAVENOUS

## 2022-09-22 MED ORDER — LIDOCAINE 2% (20 MG/ML) 5 ML SYRINGE
INTRAMUSCULAR | Status: DC | PRN
  Administered 2022-09-22: 50 mg via INTRAVENOUS

## 2022-09-22 MED ORDER — FENTANYL CITRATE (PF) 100 MCG/2ML IJ SOLN
INTRAMUSCULAR | Status: AC
  Filled 2022-09-22: qty 2

## 2022-09-22 MED ORDER — ENOXAPARIN SODIUM 40 MG/0.4ML IJ SOSY
40.0000 mg | PREFILLED_SYRINGE | INTRAMUSCULAR | Status: DC
  Administered 2022-09-22 – 2022-09-23 (×2): 40 mg via SUBCUTANEOUS
  Filled 2022-09-22 (×2): qty 0.4

## 2022-09-22 MED ORDER — PHENYLEPHRINE HCL-NACL 20-0.9 MG/250ML-% IV SOLN
INTRAVENOUS | Status: DC | PRN
  Administered 2022-09-22 (×3): 160 ug via INTRAVENOUS

## 2022-09-22 MED ORDER — PROPOFOL 10 MG/ML IV BOLUS
INTRAVENOUS | Status: DC | PRN
  Administered 2022-09-22: 100 mg via INTRAVENOUS

## 2022-09-22 MED ORDER — SODIUM CHLORIDE 0.9 % IV SOLN: INTRAVENOUS | Status: DC

## 2022-09-22 NOTE — Progress Notes (Signed)
      Subjective:  "Can I have some water?"  Antibiotics:  Anti-infectives (From admission, onward)    Start     Dose/Rate Route Frequency Ordered Stop   09/18/22 0400  vancomycin (VANCOCIN) IVPB 1000 mg/200 mL premix  Status:  Discontinued        1,000 mg 200 mL/hr over 60 Minutes Intravenous Every 36 hours 09/16/22 1809 09/19/22 1055   09/17/22 2200  cefTRIAXone (ROCEPHIN) 2 g in sodium chloride 0.9 % 100 mL IVPB        2 g 200 mL/hr over 30 Minutes Intravenous Every 24 hours 09/17/22 1047     09/17/22 0000  ceFEPIme (MAXIPIME) 2 g in sodium chloride 0.9 % 100 mL IVPB  Status:  Discontinued        2 g 200 mL/hr over 30 Minutes Intravenous Every 24 hours 09/16/22 1638 09/16/22 1639   09/17/22 0000  ceFEPIme (MAXIPIME) 500 mg in dextrose 5 % 50 mL IVPB  Status:  Discontinued        500 mg 110 mL/hr over 30 Minutes Intravenous Every 24 hours 09/16/22 1639 09/16/22 1800   09/16/22 2200  ceFEPIme (MAXIPIME) 2 g in sodium chloride 0.9 % 100 mL IVPB  Status:  Discontinued        2 g 200 mL/hr over 30 Minutes Intravenous Every 12 hours 09/16/22 1802 09/17/22 1047   09/16/22 1500  vancomycin (VANCOREADY) IVPB 750 mg/150 mL        750 mg 150 mL/hr over 60 Minutes Intravenous  Once 09/16/22 1447 09/16/22 1638   09/16/22 1500  ceFEPIme (MAXIPIME) 2 g in sodium chloride 0.9 % 100 mL IVPB        2 g 200 mL/hr over 30 Minutes Intravenous  Once 09/16/22 1451 09/16/22 1549   09/16/22 1445  ceFEPIme (MAXIPIME) 1 g in sodium chloride 0.9 % 100 mL IVPB  Status:  Discontinued        1 g 200 mL/hr over 30 Minutes Intravenous  Once 09/16/22 1438 09/16/22 1452       Medications: Scheduled Meds:  arformoterol  15 mcg Nebulization BID   budesonide (PULMICORT) nebulizer solution  0.25 mg Nebulization BID   Chlorhexidine Gluconate Cloth  6 each Topical Daily   enoxaparin (LOVENOX) injection  30 mg Subcutaneous Q24H   feeding supplement  237 mL Oral TID BM   FLUoxetine  40 mg Oral Daily    guaiFENesin  600 mg Oral BID   levothyroxine  100 mcg Oral Q0600   mirabegron ER  50 mg Oral Daily   mirtazapine  15 mg Oral QHS   multivitamin with minerals  1 tablet Oral Daily   pantoprazole  40 mg Oral Daily   revefenacin  175 mcg Nebulization Daily   tamsulosin  0.4 mg Oral Daily   temazepam  15 mg Oral QHS   Continuous Infusions:  sodium chloride Stopped (09/20/22 0904)   cefTRIAXone (ROCEPHIN)  IV 2 g (09/21/22 2106)   PRN Meds:.sodium chloride, acetaminophen, albuterol, ondansetron **OR** ondansetron (ZOFRAN) IV, mouth rinse, senna-docusate, sodium chloride HYPERTONIC    Objective: Weight change: 3.8 kg  Intake/Output Summary (Last 24 hours) at 09/22/2022 1115 Last data filed at 09/22/2022 0735 Gross per 24 hour  Intake 100 ml  Output 1876 ml  Net -1776 ml   Blood pressure (!) 103/49, pulse 73, temperature 98.4 F (36.9 C), temperature source Oral, resp. rate 20, height 5' 3" (1.6 m), weight 49 kg, SpO2 97 %. Temp:  [  97.8 F (36.6 C)-98.4 F (36.9 C)] 98.4 F (36.9 C) (07/01 0509) Pulse Rate:  [73-84] 73 (07/01 0509) Resp:  [20-24] 20 (07/01 0509) BP: (103-106)/(49-57) 103/49 (07/01 0509) SpO2:  [93 %-100 %] 97 % (07/01 0853) Weight:  [49 kg] 49 kg (07/01 0500)  Physical Exam: Physical Exam Constitutional:      General: She is not in acute distress.    Appearance: She is well-developed. She is not diaphoretic.  HENT:     Head: Normocephalic and atraumatic.     Right Ear: External ear normal.     Left Ear: External ear normal.     Mouth/Throat:     Pharynx: No oropharyngeal exudate.  Eyes:     General: No scleral icterus.    Conjunctiva/sclera: Conjunctivae normal.     Pupils: Pupils are equal, round, and reactive to light.  Cardiovascular:     Rate and Rhythm: Normal rate and regular rhythm.     Heart sounds: Normal heart sounds. No murmur heard.    No friction rub. No gallop.  Pulmonary:     Effort: Pulmonary effort is normal. No respiratory distress.      Breath sounds: Normal breath sounds. No wheezing or rales.  Abdominal:     General: There is no distension.     Palpations: Abdomen is soft.  Musculoskeletal:        General: No tenderness.  Lymphadenopathy:     Cervical: No cervical adenopathy.  Skin:    General: Skin is warm and dry.     Coloration: Skin is not pale.     Findings: No rash.  Neurological:     General: No focal deficit present.     Mental Status: She is alert and oriented to person, place, and time.     Motor: No abnormal muscle tone.     Coordination: Coordination normal.  Psychiatric:        Mood and Affect: Mood normal.        Behavior: Behavior normal.        Thought Content: Thought content normal.        Judgment: Judgment normal.      CBC:    BMET Recent Labs    09/21/22 0710 09/22/22 0435  NA 135 138  K 4.0 4.4  CL 96* 95*  CO2 30 31  GLUCOSE 103* 101*  BUN 11 11  CREATININE 0.48 0.57  CALCIUM 8.9 9.4     Liver Panel  Recent Labs    09/21/22 0710 09/22/22 0435  PROT 5.1* 5.1*  ALBUMIN 2.2* 2.3*  AST 17 18  ALT 18 17  ALKPHOS 65 63  BILITOT 0.1* 0.2*       Sedimentation Rate No results for input(s): "ESRSEDRATE" in the last 72 hours. C-Reactive Protein No results for input(s): "CRP" in the last 72 hours.  Micro Results: Recent Results (from the past 720 hour(s))  Blood culture (routine x 2)     Status: Abnormal   Collection Time: 08/31/22  5:03 PM   Specimen: BLOOD  Result Value Ref Range Status   Specimen Description   Final    BLOOD LEFT ANTECUBITAL Performed at [facility], [address] [place], [place] [address]    Special Requests   Final    BOTTLES DRAWN AEROBIC AND ANAEROBIC Blood Culture results may not be optimal due to an excessive volume of blood received in culture bottles Performed at [facility], [address] , [place] [address]      Culture  Setup Time   Final    GRAM POSITIVE COCCI IN  PAIRS AEROBIC BOTTLE ONLY CRITICAL RESULT CALLED TO, READ BACK BY AND VERIFIED WITH: PHARMD AUHN P. [phone] [id number] FCP Performed at [facility], [address] [place], [place] [address]    Culture STREPTOCOCCUS PNEUMONIAE (A)  Final   Report Status 09/03/2022 FINAL  Final   Organism ID, Bacteria STREPTOCOCCUS PNEUMONIAE  Final      Susceptibility   Streptococcus pneumoniae - MIC*    ERYTHROMYCIN >=8 RESISTANT Resistant     LEVOFLOXACIN 0.5 SENSITIVE Sensitive     VANCOMYCIN 0.5 SENSITIVE Sensitive     PENICILLIN (meningitis) 1 RESISTANT Resistant     PENO - penicillin 1      PENICILLIN (non-meningitis) 1 SENSITIVE Sensitive     PENICILLIN (oral) 1 INTERMEDIATE Intermediate     CEFTRIAXONE (non-meningitis) 1 SENSITIVE Sensitive     CEFTRIAXONE (meningitis) 1 INTERMEDIATE Intermediate     * STREPTOCOCCUS PNEUMONIAE  Blood Culture ID Panel (Reflexed)     Status: Abnormal   Collection Time: 08/31/22  5:03 PM  Result Value Ref Range Status   Enterococcus faecalis NOT DETECTED NOT DETECTED Final   Enterococcus Faecium NOT DETECTED NOT DETECTED Final   Listeria monocytogenes NOT DETECTED NOT DETECTED Final   Staphylococcus species NOT DETECTED NOT DETECTED Final   Staphylococcus aureus (BCID) NOT DETECTED NOT DETECTED Final   Staphylococcus epidermidis NOT DETECTED NOT DETECTED Final   Staphylococcus lugdunensis NOT DETECTED NOT DETECTED Final   Streptococcus species DETECTED (A) NOT DETECTED Final    Comment: CRITICAL RESULT CALLED TO, READ BACK BY AND VERIFIED WITH: PHARMD AUHN P. [id number] FCP    Streptococcus agalactiae NOT DETECTED NOT DETECTED Final   Streptococcus pneumoniae DETECTED (A) NOT DETECTED Final    Comment: CRITICAL RESULT CALLED TO, READ BACK BY AND VERIFIED WITH: PHARMD AUHN P. [id number] FCP    Streptococcus pyogenes NOT DETECTED NOT DETECTED Final   A.calcoaceticus-baumannii NOT DETECTED NOT DETECTED Final   Bacteroides fragilis NOT DETECTED NOT  DETECTED Final   Enterobacterales NOT DETECTED NOT DETECTED Final   Enterobacter cloacae complex NOT DETECTED NOT DETECTED Final   Escherichia coli NOT DETECTED NOT DETECTED Final   Klebsiella aerogenes NOT DETECTED NOT DETECTED Final   Klebsiella oxytoca NOT DETECTED NOT DETECTED Final   Klebsiella pneumoniae NOT DETECTED NOT DETECTED Final   Proteus species NOT DETECTED NOT DETECTED Final   Salmonella species NOT DETECTED NOT DETECTED Final   Serratia marcescens NOT DETECTED NOT DETECTED Final   Haemophilus influenzae NOT DETECTED NOT DETECTED Final   Neisseria meningitidis NOT DETECTED NOT DETECTED Final   Pseudomonas aeruginosa NOT DETECTED NOT DETECTED Final   Stenotrophomonas maltophilia NOT DETECTED NOT DETECTED Final   Candida albicans NOT DETECTED NOT DETECTED Final   Candida auris NOT DETECTED NOT DETECTED Final   Candida glabrata NOT DETECTED NOT DETECTED Final   Candida krusei NOT DETECTED NOT DETECTED Final   Candida parapsilosis NOT DETECTED NOT DETECTED Final   Candida tropicalis NOT DETECTED NOT DETECTED Final   Cryptococcus neoformans/gattii NOT DETECTED NOT DETECTED Final    Comment: Performed at [facility], [address] [place], [place] [address]  Blood culture (routine x 2)     Status: None   Collection Time: 08/31/22  5:10 PM   Specimen: BLOOD RIGHT HAND  Result Value Ref Range Status   Specimen Description   Final    BLOOD RIGHT HAND Performed at   [facility], [address] [place], [place] [address]    Special Requests   Final    BOTTLES DRAWN AEROBIC AND ANAEROBIC Blood Culture adequate volume Performed at [facility], [address] [place], [place] [address]    Culture   Final    NO GROWTH 5 DAYS Performed at [facility], [address] [place], [place] [address]    Report Status 09/05/2022 FINAL  Final  Respiratory (~20 pathogens) panel by PCR     Status: Abnormal   Collection Time: 08/31/22  8:51 PM    Specimen: Nasopharyngeal Swab; Respiratory  Result Value Ref Range Status   Adenovirus NOT DETECTED NOT DETECTED Final   Coronavirus 229E NOT DETECTED NOT DETECTED Final    Comment: (NOTE) The Coronavirus on the Respiratory Panel, DOES NOT test for the novel  Coronavirus (2019 nCoV)    Coronavirus HKU1 NOT DETECTED NOT DETECTED Final   Coronavirus NL63 NOT DETECTED NOT DETECTED Final   Coronavirus OC43 NOT DETECTED NOT DETECTED Final   Metapneumovirus NOT DETECTED NOT DETECTED Final   Rhinovirus / Enterovirus DETECTED (A) NOT DETECTED Final   Influenza A NOT DETECTED NOT DETECTED Final   Influenza B NOT DETECTED NOT DETECTED Final   Parainfluenza Virus 1 NOT DETECTED NOT DETECTED Final   Parainfluenza Virus 2 NOT DETECTED NOT DETECTED Final   Parainfluenza Virus 3 NOT DETECTED NOT DETECTED Final   Parainfluenza Virus 4 NOT DETECTED NOT DETECTED Final   Respiratory Syncytial Virus NOT DETECTED NOT DETECTED Final   Bordetella pertussis NOT DETECTED NOT DETECTED Final   Bordetella Parapertussis NOT DETECTED NOT DETECTED Final   Chlamydophila pneumoniae NOT DETECTED NOT DETECTED Final   Mycoplasma pneumoniae NOT DETECTED NOT DETECTED Final    Comment: Performed at [facility], [address] , [place] [address]  SARS Coronavirus 2 by RT PCR (hospital order, performed in [facility]) *cepheid single result test* Anterior Nasal Swab     Status: None   Collection Time: 08/31/22  8:51 PM   Specimen: Anterior Nasal Swab  Result Value Ref Range Status   SARS Coronavirus 2 by RT PCR NEGATIVE NEGATIVE Final    Comment: (NOTE) SARS-CoV-2 target nucleic acids are NOT DETECTED.  The SARS-CoV-2 RNA is generally detectable in upper and lower respiratory specimens during the acute phase of infection. The lowest concentration of SARS-CoV-2 viral copies this assay can detect is 250 copies / mL. A negative result does not preclude SARS-CoV-2 infection and should not be  used as the sole basis for treatment or other patient management decisions.  A negative result may occur with improper specimen collection / handling, submission of specimen other than nasopharyngeal swab, presence of viral mutation(s) within the areas targeted by this assay, and inadequate number of viral copies (<250 copies / mL). A negative result must be combined with clinical observations, patient history, and epidemiological information.  Fact Sheet for Patients:   https://www.fda.gov/media/158405/download  Fact Sheet for Healthcare Providers: https://www.fda.gov/media/158404/download  This test is not yet approved or  cleared by the [place] FDA and has been authorized for detection and/or diagnosis of SARS-CoV-2 by FDA under an Emergency Use Authorization (EUA).  This EUA will remain in effect (meaning this test can be used) for the duration of the COVID-19 declaration under Section 564(b)(1) of the Act, 21 U.S.C. section 360bbb-3(b)(1), unless the authorization is terminated or revoked sooner.  Performed at [facility], [address] [place], [place] [address]   MRSA Next Gen by PCR, Nasal     Status: None   Collection Time: 08/31/22  8:51 PM   Specimen: Nasal Mucosa; Nasal Swab  Result Value Ref Range Status   MRSA by PCR Next Gen NOT DETECTED NOT DETECTED Final    Comment: (NOTE) The GeneXpert MRSA Assay (FDA approved for NASAL specimens only), is one component of a comprehensive MRSA colonization surveillance program. It is not intended to diagnose MRSA infection nor to guide or monitor treatment for MRSA infections. Test performance is not FDA approved in patients less than 2 years old. Performed at [facility], [address] [place], [place] [address]   Urine Culture     Status: Abnormal   Collection Time: 08/31/22  8:51 PM   Specimen: Urine, Random  Result Value Ref Range Status   Specimen Description   Final     URINE, RANDOM Performed at [facility], [address] [place], [place] [address]    Special Requests   Final    NONE Reflexed from [id number] Performed at [facility], [address] [place], [place] [address]    Culture 20,000 COLONIES/mL PSEUDOMONAS AERUGINOSA (A)  Final   Report Status 09/03/2022 FINAL  Final   Organism ID, Bacteria PSEUDOMONAS AERUGINOSA (A)  Final      Susceptibility   Pseudomonas aeruginosa - MIC*    CEFTAZIDIME 16 INTERMEDIATE Intermediate     CIPROFLOXACIN 1 INTERMEDIATE Intermediate     GENTAMICIN <=1 SENSITIVE Sensitive     IMIPENEM 2 SENSITIVE Sensitive     * 20,000 COLONIES/mL PSEUDOMONAS AERUGINOSA  Culture, blood (single)     Status: Abnormal   Collection Time: 09/16/22  2:08 PM   Specimen: BLOOD RIGHT FOREARM  Result Value Ref Range Status   Specimen Description   Final    BLOOD RIGHT FOREARM Performed at [facility], [address] [place], [place] [address]    Special Requests   Final    BOTTLES DRAWN AEROBIC AND ANAEROBIC Blood Culture adequate volume Performed at [facility], [address] [place], [place] [address]    Culture  Setup Time   Final    GRAM POSITIVE COCCI IN BOTH AEROBIC AND ANAEROBIC BOTTLES CRITICAL RESULT CALLED TO, READ BACK BY AND VERIFIED WITH: PHARMD J. GADHIA [id number] FCP Performed at [facility], [address] [place], [place] [address]    Culture STREPTOCOCCUS PNEUMONIAE (A)  Final   Report Status 09/19/2022 FINAL  Final   Organism ID, Bacteria STREPTOCOCCUS PNEUMONIAE  Final      Susceptibility   Streptococcus pneumoniae - MIC*    ERYTHROMYCIN >=8 RESISTANT Resistant     LEVOFLOXACIN 0.5 SENSITIVE Sensitive     VANCOMYCIN 0.5 SENSITIVE Sensitive     PENICILLIN (meningitis) 1 RESISTANT Resistant     PENO - penicillin 1      PENICILLIN (non-meningitis) 1 SENSITIVE Sensitive     PENICILLIN (oral) 1 INTERMEDIATE  Intermediate     CEFTRIAXONE (non-meningitis) 0.5 SENSITIVE Sensitive     CEFTRIAXONE (meningitis) 0.5 SENSITIVE Sensitive     * STREPTOCOCCUS PNEUMONIAE  Blood Culture ID Panel (Reflexed)     Status: Abnormal   Collection Time: 09/16/22  2:08 PM  Result Value Ref Range Status   Enterococcus faecalis NOT DETECTED NOT DETECTED Final   Enterococcus Faecium NOT DETECTED NOT DETECTED Final   Listeria monocytogenes NOT DETECTED NOT DETECTED Final     Staphylococcus species NOT DETECTED NOT DETECTED Final   Staphylococcus aureus (BCID) NOT DETECTED NOT DETECTED Final   Staphylococcus epidermidis NOT DETECTED NOT DETECTED Final   Staphylococcus lugdunensis NOT DETECTED NOT DETECTED Final   Streptococcus species DETECTED (A) NOT DETECTED Final    Comment: CRITICAL RESULT CALLED TO, READ BACK BY AND VERIFIED WITH: PHARMD J. GADHIA [id number] FCP    Streptococcus agalactiae NOT DETECTED NOT DETECTED Final   Streptococcus pneumoniae DETECTED (A) NOT DETECTED Final    Comment: CRITICAL RESULT CALLED TO, READ BACK BY AND VERIFIED WITH: PHARMD J. GADHIA [id number] FCP    Streptococcus pyogenes NOT DETECTED NOT DETECTED Final   A.calcoaceticus-baumannii NOT DETECTED NOT DETECTED Final   Bacteroides fragilis NOT DETECTED NOT DETECTED Final   Enterobacterales NOT DETECTED NOT DETECTED Final   Enterobacter cloacae complex NOT DETECTED NOT DETECTED Final   Escherichia coli NOT DETECTED NOT DETECTED Final   Klebsiella aerogenes NOT DETECTED NOT DETECTED Final   Klebsiella oxytoca NOT DETECTED NOT DETECTED Final   Klebsiella pneumoniae NOT DETECTED NOT DETECTED Final   Proteus species NOT DETECTED NOT DETECTED Final   Salmonella species NOT DETECTED NOT DETECTED Final   Serratia marcescens NOT DETECTED NOT DETECTED Final   Haemophilus influenzae NOT DETECTED NOT DETECTED Final   Neisseria meningitidis NOT DETECTED NOT DETECTED Final   Pseudomonas aeruginosa NOT DETECTED NOT DETECTED Final    Stenotrophomonas maltophilia NOT DETECTED NOT DETECTED Final   Candida albicans NOT DETECTED NOT DETECTED Final   Candida auris NOT DETECTED NOT DETECTED Final   Candida glabrata NOT DETECTED NOT DETECTED Final   Candida krusei NOT DETECTED NOT DETECTED Final   Candida parapsilosis NOT DETECTED NOT DETECTED Final   Candida tropicalis NOT DETECTED NOT DETECTED Final   Cryptococcus neoformans/gattii NOT DETECTED NOT DETECTED Final    Comment: Performed at [facility], [address] [place], [place] [address]  MRSA Next Gen by PCR, Nasal     Status: None   Collection Time: 09/16/22  5:38 PM   Specimen: Nasal Mucosa; Nasal Swab  Result Value Ref Range Status   MRSA by PCR Next Gen NOT DETECTED NOT DETECTED Final    Comment: (NOTE) The GeneXpert MRSA Assay (FDA approved for NASAL specimens only), is one component of a comprehensive MRSA colonization surveillance program. It is not intended to diagnose MRSA infection nor to guide or monitor treatment for MRSA infections. Test performance is not FDA approved in patients less than 2 years old. Performed at [facility], [address] , [place] [address]   Respiratory (~20 pathogens) panel by PCR     Status: Abnormal   Collection Time: 09/16/22  6:47 PM   Specimen: Nasopharyngeal Swab; Respiratory  Result Value Ref Range Status   Adenovirus NOT DETECTED NOT DETECTED Final   Coronavirus 229E NOT DETECTED NOT DETECTED Final    Comment: (NOTE) The Coronavirus on the Respiratory Panel, DOES NOT test for the novel  Coronavirus (2019 nCoV)    Coronavirus HKU1 NOT DETECTED NOT DETECTED Final   Coronavirus NL63 NOT DETECTED NOT DETECTED Final   Coronavirus OC43 NOT DETECTED NOT DETECTED Final   Metapneumovirus NOT DETECTED NOT DETECTED Final   Rhinovirus / Enterovirus DETECTED (A) NOT DETECTED Final   Influenza A NOT DETECTED NOT DETECTED Final   Influenza B NOT DETECTED NOT DETECTED Final   Parainfluenza  Virus 1 NOT DETECTED NOT DETECTED Final   Parainfluenza Virus 2 NOT DETECTED NOT DETECTED Final     Parainfluenza Virus 3 NOT DETECTED NOT DETECTED Final   Parainfluenza Virus 4 NOT DETECTED NOT DETECTED Final   Respiratory Syncytial Virus NOT DETECTED NOT DETECTED Final   Bordetella pertussis NOT DETECTED NOT DETECTED Final   Bordetella Parapertussis NOT DETECTED NOT DETECTED Final   Chlamydophila pneumoniae NOT DETECTED NOT DETECTED Final   Mycoplasma pneumoniae NOT DETECTED NOT DETECTED Final    Comment: Performed at [facility], [address] [place], [place] [address]  Culture, blood (single) w Reflex to ID Panel     Status: None   Collection Time: 09/16/22  7:14 PM   Specimen: BLOOD  Result Value Ref Range Status   Specimen Description   Final    BLOOD BLOOD RIGHT ARM Performed at [facility], [address] [place], [place] [address]    Special Requests   Final    BOTTLES DRAWN AEROBIC ONLY Blood Culture adequate volume Performed at [facility], [address] [place], [place] [address]    Culture   Final    NO GROWTH 5 DAYS Performed at [facility], [address] [place], [place] [address]    Report Status 09/21/2022 FINAL  Final  Culture, blood (single) w Reflex to ID Panel     Status: None   Collection Time: 09/16/22  7:18 PM   Specimen: BLOOD  Result Value Ref Range Status   Specimen Description   Final    BLOOD BLOOD LEFT HAND Performed at [facility], [address] [place], [place] [address]    Special Requests   Final    BOTTLES DRAWN AEROBIC ONLY Blood Culture adequate volume Performed at [facility], [address] [place], [place] [address]    Culture   Final    NO GROWTH 5 DAYS Performed at [facility], [address] [place], [place] [address]    Report Status 09/21/2022 FINAL  Final  Culture, blood (Routine X 2) w Reflex to ID Panel     Status:  None (Preliminary result)   Collection Time: 09/18/22  3:33 PM   Specimen: BLOOD LEFT HAND  Result Value Ref Range Status   Specimen Description   Final    BLOOD LEFT HAND Performed at [facility], [address] [place], [place] [address]    Special Requests   Final    AEROBIC BOTTLE ONLY Blood Culture adequate volume Performed at [facility], [address] [place], [place] [address]    Culture   Final    NO GROWTH 3 DAYS Performed at [facility], [address] [place], [place] [address]    Report Status PENDING  Incomplete  Culture, blood (Routine X 2) w Reflex to ID Panel     Status: None (Preliminary result)   Collection Time: 09/18/22  3:43 PM   Specimen: BLOOD LEFT HAND  Result Value Ref Range Status   Specimen Description   Final    BLOOD LEFT HAND Performed at [facility], [address] [place], [place] [address]    Special Requests   Final    AEROBIC BOTTLE ONLY Blood Culture adequate volume Performed at [facility], [address] [place], [place] [address]    Culture   Final    NO GROWTH 3 DAYS Performed at [facility], [address] , [place] [address]    Report Status PENDING  Incomplete      Studies/Results: No results found.    Assessment/Plan:  INTERVAL HISTORY: pt to have TEE this week (she believes today)   Principal Problem:   Pneumococcal bacteremia Active Problems:   Acute on chronic respiratory failure with hypoxia (HCC)   HCAP (healthcare-associated pneumonia)    Kayla [name] is a 72 y.o. female with  hx of MS, agammaglobulinemia previously on immunoglobulin replacement (via subcutaneous route) admitted with current pneumonia and bacteremia due to pneumococcus  #1 Recurrent coccal bacteremia:  Agree that she needs a transesophageal echocardiogram.  I will order complement levels though I suspect her immunoglobulin deficiency  is the driver of her recurrent pneumococcal bacteremia's.  I would favor dosing her with IV immunoglobulin while she was in the hospital so that we know she leaves with a good amount of antibody in her system.  She currently is scheduled for her first visit with Eric Kozlow, MD with [facility]--previously followed in [place]  Continue ceftriaxone  I have personally spent 52 minutes involved in face-to-face and non-face-to-face activities for this patient on the day of the visit. Professional time spent includes the following activities: Preparing to see the patient (review of tests), Obtaining and/or reviewing separately obtained history (admission/discharge record), Performing a medically appropriate examination and/or evaluation , Ordering medications/tests/procedures, referring and communicating with other health care professionals, Documenting clinical information in the EMR, Independently interpreting results (not separately reported), Communicating results to the patient/family/caregiver, Counseling and educating the patient/family/caregiver and Care coordination (not separately reported).     LOS: 6 days   Tyrail Grandfield [place] 09/22/2022, 11:15 AM   

## 2022-09-22 NOTE — Anesthesia Preprocedure Evaluation (Signed)
Anesthesia Evaluation  Patient identified by MRN, date of birth, ID band Patient awake    Reviewed: Allergy & Precautions, NPO status , Patient's Chart, lab work & pertinent test results  History of Anesthesia Complications Negative for: history of anesthetic complications  Airway Mallampati: III  TM Distance: >3 FB Neck ROM: Full    Dental  (+) Dental Advisory Given   Pulmonary asthma , pneumonia, COPD, Recent URI , Residual Cough    + decreased breath sounds      Cardiovascular  Rhythm:Regular   1. Left ventricular ejection fraction, by estimation, is 60 to 65%. Left  ventricular ejection fraction by 2D MOD biplane is 63.1 %. The left  ventricle has normal function. The left ventricle has no regional wall  motion abnormalities. Left ventricular  diastolic parameters are consistent with Grade I diastolic dysfunction  (impaired relaxation).   2. Right ventricular systolic function is normal. The right ventricular  size is normal. There is normal pulmonary artery systolic pressure. The  estimated right ventricular systolic pressure is 33.0 mmHg.   3. The mitral valve is grossly normal. Trivial mitral valve  regurgitation. No evidence of mitral stenosis.   4. The aortic valve is tricuspid. Aortic valve regurgitation is not  visualized. No aortic stenosis is present.   5. The inferior vena cava is normal in size with greater than 50%  respiratory variability, suggesting right atrial pressure of 3 mmHg.     Neuro/Psych  PSYCHIATRIC DISORDERS  Depression    ms  Neuromuscular disease    GI/Hepatic Neg liver ROS, hiatal hernia,GERD  ,,  Endo/Other  Hypothyroidism    Renal/GU negative Renal ROSLab Results      Component                Value               Date                      CREATININE               0.57                09/22/2022                Musculoskeletal  (+) Arthritis ,    Abdominal   Peds  Hematology  (+)  Blood dyscrasia, anemia Lab Results      Component                Value               Date                      WBC                      13.5 (H)            09/22/2022                HGB                      8.8 (L)             09/22/2022                HCT                      28.8 (L)  09/22/2022                MCV                      93.5                09/22/2022                PLT                      607 (H)             09/22/2022              Anesthesia Other Findings Left vocal cord paralysis due to ms  Reproductive/Obstetrics                             Anesthesia Physical Anesthesia Plan  ASA: 4  Anesthesia Plan: MAC   Post-op Pain Management: Minimal or no pain anticipated   Induction: Intravenous  PONV Risk Score and Plan: Ondansetron and Propofol infusion  Airway Management Planned: Nasal Cannula and Natural Airway  Additional Equipment: None  Intra-op Plan:   Post-operative Plan:   Informed Consent: I have reviewed the patients History and Physical, chart, labs and discussed the procedure including the risks, benefits and alternatives for the proposed anesthesia with the patient or authorized representative who has indicated his/her understanding and acceptance.     Dental advisory given  Plan Discussed with: CRNA  Anesthesia Plan Comments:        Anesthesia Quick Evaluation

## 2022-09-22 NOTE — CV Procedure (Signed)
TEE  Indication:  Bacteremia  Patient sedated by anesthesia with propofol intravenously Mouth guard placed  Throat numbed with cetacaine spray TEE probe advanced to mid esophagus without difficulty   TV normal  Trivial TR MV normal  AV normal  No AI PV normal LVEF and RVEF normal   LA, LAA without masses  Very small PFO as tested with injection of agitated saline  Normal thoracic aorta  Procedure was without complication.  Dietrich Pates MD

## 2022-09-22 NOTE — Interval H&P Note (Signed)
History and Physical Interval Note:  09/22/2022 2:07 PM  Kayla [name]  has presented today for surgery, with the diagnosis of endocarditis.  The various methods of treatment have been discussed with the patient and family. After consideration of risks, benefits and other options for treatment, the patient has consented to  Procedure(s): TRANSESOPHAGEAL ECHOCARDIOGRAM (N/A) as a surgical intervention.  [name] history has been reviewed, patient examined, no change in status, stable for surgery.  I have reviewed the patient's chart and labs.  Questions were answered to the patient's satisfaction.     Dietrich Pates

## 2022-09-22 NOTE — H&P (View-Only) (Signed)
Subjective:  "Can I have some water?"  Antibiotics:  Anti-infectives (From admission, onward)    Start     Dose/Rate Route Frequency Ordered Stop   09/18/22 0400  vancomycin (VANCOCIN) IVPB 1000 mg/200 mL premix  Status:  Discontinued        1,000 mg 200 mL/hr over 60 Minutes Intravenous Every 36 hours 09/16/22 1809 09/19/22 1055   09/17/22 2200  cefTRIAXone (ROCEPHIN) 2 g in sodium chloride 0.9 % 100 mL IVPB        2 g 200 mL/hr over 30 Minutes Intravenous Every 24 hours 09/17/22 1047     09/17/22 0000  ceFEPIme (MAXIPIME) 2 g in sodium chloride 0.9 % 100 mL IVPB  Status:  Discontinued        2 g 200 mL/hr over 30 Minutes Intravenous Every 24 hours 09/16/22 1638 09/16/22 1639   09/17/22 0000  ceFEPIme (MAXIPIME) 500 mg in dextrose 5 % 50 mL IVPB  Status:  Discontinued        500 mg 110 mL/hr over 30 Minutes Intravenous Every 24 hours 09/16/22 1639 09/16/22 1800   09/16/22 2200  ceFEPIme (MAXIPIME) 2 g in sodium chloride 0.9 % 100 mL IVPB  Status:  Discontinued        2 g 200 mL/hr over 30 Minutes Intravenous Every 12 hours 09/16/22 1802 09/17/22 1047   09/16/22 1500  vancomycin (VANCOREADY) IVPB 750 mg/150 mL        750 mg 150 mL/hr over 60 Minutes Intravenous  Once 09/16/22 1447 09/16/22 1638   09/16/22 1500  ceFEPIme (MAXIPIME) 2 g in sodium chloride 0.9 % 100 mL IVPB        2 g 200 mL/hr over 30 Minutes Intravenous  Once 09/16/22 1451 09/16/22 1549   09/16/22 1445  ceFEPIme (MAXIPIME) 1 g in sodium chloride 0.9 % 100 mL IVPB  Status:  Discontinued        1 g 200 mL/hr over 30 Minutes Intravenous  Once 09/16/22 1438 09/16/22 1452       Medications: Scheduled Meds:  arformoterol  15 mcg Nebulization BID   budesonide (PULMICORT) nebulizer solution  0.25 mg Nebulization BID   Chlorhexidine Gluconate Cloth  6 each Topical Daily   enoxaparin (LOVENOX) injection  30 mg Subcutaneous Q24H   feeding supplement  237 mL Oral TID BM   FLUoxetine  40 mg Oral Daily    guaiFENesin  600 mg Oral BID   levothyroxine  100 mcg Oral Q0600   mirabegron ER  50 mg Oral Daily   mirtazapine  15 mg Oral QHS   multivitamin with minerals  1 tablet Oral Daily   pantoprazole  40 mg Oral Daily   revefenacin  175 mcg Nebulization Daily   tamsulosin  0.4 mg Oral Daily   temazepam  15 mg Oral QHS   Continuous Infusions:  sodium chloride Stopped (09/20/22 0904)   cefTRIAXone (ROCEPHIN)  IV 2 g (09/21/22 2106)   PRN Meds:.sodium chloride, acetaminophen, albuterol, ondansetron **OR** ondansetron (ZOFRAN) IV, mouth rinse, senna-docusate, sodium chloride HYPERTONIC    Objective: Weight change: 3.8 kg  Intake/Output Summary (Last 24 hours) at 09/22/2022 1115 Last data filed at 09/22/2022 0735 Gross per 24 hour  Intake 100 ml  Output 1876 ml  Net -1776 ml   Blood pressure (!) 103/49, pulse 73, temperature 98.4 F (36.9 C), temperature source Oral, resp. rate 20, height 5\' 3"  (1.6 m), weight 49 kg, SpO2 97 %. Temp:  [  97.8 F (36.6 C)-98.4 F (36.9 C)] 98.4 F (36.9 C) (07/01 0509) Pulse Rate:  [73-84] 73 (07/01 0509) Resp:  [20-24] 20 (07/01 0509) BP: (103-106)/(49-57) 103/49 (07/01 0509) SpO2:  [93 %-100 %] 97 % (07/01 0853) Weight:  [49 kg] 49 kg (07/01 0500)  Physical Exam: Physical Exam Constitutional:      General: Kayla [name] is not in acute distress.    Appearance: Kayla [name] is well-developed. Kayla [name] is not diaphoretic.  HENT:     Head: Normocephalic and atraumatic.     Right Ear: External ear normal.     Left Ear: External ear normal.     Mouth/Throat:     Pharynx: No oropharyngeal exudate.  Eyes:     General: No scleral icterus.    Conjunctiva/sclera: Conjunctivae normal.     Pupils: Pupils are equal, round, and reactive to light.  Cardiovascular:     Rate and Rhythm: Normal rate and regular rhythm.     Heart sounds: Normal heart sounds. No murmur heard.    No friction rub. No gallop.  Pulmonary:     Effort: Pulmonary effort is normal. No respiratory distress.      Breath sounds: Normal breath sounds. No wheezing or rales.  Abdominal:     General: There is no distension.     Palpations: Abdomen is soft.  Musculoskeletal:        General: No tenderness.  Lymphadenopathy:     Cervical: No cervical adenopathy.  Skin:    General: Skin is warm and dry.     Coloration: Skin is not pale.     Findings: No rash.  Neurological:     General: No focal deficit present.     Mental Status: Kayla [name] is alert and oriented to person, place, and time.     Motor: No abnormal muscle tone.     Coordination: Coordination normal.  Psychiatric:        Mood and Affect: Mood normal.        Behavior: Behavior normal.        Thought Content: Thought content normal.        Judgment: Judgment normal.      CBC:    BMET Recent Labs    09/21/22 0710 09/22/22 0435  NA 135 138  K 4.0 4.4  CL 96* 95*  CO2 30 31  GLUCOSE 103* 101*  BUN 11 11  CREATININE 0.48 0.57  CALCIUM 8.9 9.4     Liver Panel  Recent Labs    09/21/22 0710 09/22/22 0435  PROT 5.1* 5.1*  ALBUMIN 2.2* 2.3*  AST 17 18  ALT 18 17  ALKPHOS 65 63  BILITOT 0.1* 0.2*       Sedimentation Rate No results for input(s): "ESRSEDRATE" in the last 72 hours. C-Reactive Protein No results for input(s): "CRP" in the last 72 hours.  Micro Results: Recent Results (from the past 720 hour(s))  Blood culture (routine x 2)     Status: Abnormal   Collection Time: 08/31/22  5:03 PM   Specimen: BLOOD  Result Value Ref Range Status   Specimen Description   Final    BLOOD LEFT ANTECUBITAL Performed at [facility], [address], [place], [place] [address]    Special Requests   Final    BOTTLES DRAWN AEROBIC AND ANAEROBIC Blood Culture results may not be optimal due to an excessive volume of blood received in culture bottles Performed at [facility], [address], [place], [place] [address]  Culture  Setup Time   Final    GRAM POSITIVE COCCI IN  PAIRS AEROBIC BOTTLE ONLY CRITICAL RESULT CALLED TO, READ BACK BY AND VERIFIED WITH: PHARMD AUHN P. [phone] FCP Performed at [facility], [address], [place], [place] [address]    Culture STREPTOCOCCUS PNEUMONIAE (A)  Final   Report Status 09/03/2022 FINAL  Final   Organism ID, Bacteria STREPTOCOCCUS PNEUMONIAE  Final      Susceptibility   Streptococcus pneumoniae - MIC*    ERYTHROMYCIN >=8 RESISTANT Resistant     LEVOFLOXACIN 0.5 SENSITIVE Sensitive     VANCOMYCIN 0.5 SENSITIVE Sensitive     PENICILLIN (meningitis) 1 RESISTANT Resistant     PENO - penicillin 1      PENICILLIN (non-meningitis) 1 SENSITIVE Sensitive     PENICILLIN (oral) 1 INTERMEDIATE Intermediate     CEFTRIAXONE (non-meningitis) 1 SENSITIVE Sensitive     CEFTRIAXONE (meningitis) 1 INTERMEDIATE Intermediate     * STREPTOCOCCUS PNEUMONIAE  Blood Culture ID Panel (Reflexed)     Status: Abnormal   Collection Time: 08/31/22  5:03 PM  Result Value Ref Range Status   Enterococcus faecalis NOT DETECTED NOT DETECTED Final   Enterococcus Faecium NOT DETECTED NOT DETECTED Final   Listeria monocytogenes NOT DETECTED NOT DETECTED Final   Staphylococcus species NOT DETECTED NOT DETECTED Final   Staphylococcus aureus (BCID) NOT DETECTED NOT DETECTED Final   Staphylococcus epidermidis NOT DETECTED NOT DETECTED Final   Staphylococcus lugdunensis NOT DETECTED NOT DETECTED Final   Streptococcus species DETECTED (A) NOT DETECTED Final    Comment: CRITICAL RESULT CALLED TO, READ BACK BY AND VERIFIED WITH: PHARMD AUHN P. [phone] FCP    Streptococcus agalactiae NOT DETECTED NOT DETECTED Final   Streptococcus pneumoniae DETECTED (A) NOT DETECTED Final    Comment: CRITICAL RESULT CALLED TO, READ BACK BY AND VERIFIED WITH: PHARMD AUHN P. [phone] FCP    Streptococcus pyogenes NOT DETECTED NOT DETECTED Final   A.calcoaceticus-baumannii NOT DETECTED NOT DETECTED Final   Bacteroides fragilis NOT DETECTED NOT  DETECTED Final   Enterobacterales NOT DETECTED NOT DETECTED Final   Enterobacter cloacae complex NOT DETECTED NOT DETECTED Final   Escherichia coli NOT DETECTED NOT DETECTED Final   Klebsiella aerogenes NOT DETECTED NOT DETECTED Final   Klebsiella oxytoca NOT DETECTED NOT DETECTED Final   Klebsiella pneumoniae NOT DETECTED NOT DETECTED Final   Proteus species NOT DETECTED NOT DETECTED Final   Salmonella species NOT DETECTED NOT DETECTED Final   Serratia marcescens NOT DETECTED NOT DETECTED Final   Haemophilus influenzae NOT DETECTED NOT DETECTED Final   Neisseria meningitidis NOT DETECTED NOT DETECTED Final   Pseudomonas aeruginosa NOT DETECTED NOT DETECTED Final   Stenotrophomonas maltophilia NOT DETECTED NOT DETECTED Final   Candida albicans NOT DETECTED NOT DETECTED Final   Candida auris NOT DETECTED NOT DETECTED Final   Candida glabrata NOT DETECTED NOT DETECTED Final   Candida krusei NOT DETECTED NOT DETECTED Final   Candida parapsilosis NOT DETECTED NOT DETECTED Final   Candida tropicalis NOT DETECTED NOT DETECTED Final   Cryptococcus neoformans/gattii NOT DETECTED NOT DETECTED Final    Comment: Performed at [facility], [address] [place], [place] [address]  Blood culture (routine x 2)     Status: None   Collection Time: 08/31/22  5:10 PM   Specimen: BLOOD RIGHT HAND  Result Value Ref Range Status   Specimen Description   Final    BLOOD RIGHT HAND Performed at  [facility], [address] [place] [place], [place] [address]    Special Requests   Final    BOTTLES DRAWN AEROBIC AND ANAEROBIC Blood Culture adequate volume Performed at [facility], [address], [place], [place] [address]    Culture   Final    NO GROWTH 5 DAYS Performed at [facility], [address], [place], [place] [address]    Report Status 09/05/2022 FINAL  Final  Respiratory (~20 pathogens) panel by PCR     Status: Abnormal   Collection Time: 08/31/22  8:51 PM    Specimen: Nasopharyngeal Swab; Respiratory  Result Value Ref Range Status   Adenovirus NOT DETECTED NOT DETECTED Final   Coronavirus 229E NOT DETECTED NOT DETECTED Final    Comment: (NOTE) The Coronavirus on the Respiratory Panel, DOES NOT test for the novel  Coronavirus (2019 nCoV)    Coronavirus HKU1 NOT DETECTED NOT DETECTED Final   Coronavirus NL63 NOT DETECTED NOT DETECTED Final   Coronavirus OC43 NOT DETECTED NOT DETECTED Final   Metapneumovirus NOT DETECTED NOT DETECTED Final   Rhinovirus / Enterovirus DETECTED (A) NOT DETECTED Final   Influenza A NOT DETECTED NOT DETECTED Final   Influenza B NOT DETECTED NOT DETECTED Final   Parainfluenza Virus 1 NOT DETECTED NOT DETECTED Final   Parainfluenza Virus 2 NOT DETECTED NOT DETECTED Final   Parainfluenza Virus 3 NOT DETECTED NOT DETECTED Final   Parainfluenza Virus 4 NOT DETECTED NOT DETECTED Final   Respiratory Syncytial Virus NOT DETECTED NOT DETECTED Final   Bordetella pertussis NOT DETECTED NOT DETECTED Final   Bordetella Parapertussis NOT DETECTED NOT DETECTED Final   Chlamydophila pneumoniae NOT DETECTED NOT DETECTED Final   Mycoplasma pneumoniae NOT DETECTED NOT DETECTED Final    Comment: Performed at [facility], [address] [place], [place] [address]  SARS Coronavirus 2 by RT PCR (hospital order, performed in [facility] hospital lab) *cepheid single result test* Anterior Nasal Swab     Status: None   Collection Time: 08/31/22  8:51 PM   Specimen: Anterior Nasal Swab  Result Value Ref Range Status   SARS Coronavirus 2 by RT PCR NEGATIVE NEGATIVE Final    Comment: (NOTE) SARS-CoV-2 target nucleic acids are NOT DETECTED.  The SARS-CoV-2 RNA is generally detectable in upper and lower respiratory specimens during the acute phase of infection. The lowest concentration of SARS-CoV-2 viral copies this assay can detect is 250 copies / mL. A negative result does not preclude SARS-CoV-2 infection and should not be  used as the sole basis for treatment or other patient management decisions.  A negative result may occur with improper specimen collection / handling, submission of specimen other than nasopharyngeal swab, presence of viral mutation(s) within the areas targeted by this assay, and inadequate number of viral copies (<250 copies / mL). A negative result must be combined with clinical observations, patient history, and epidemiological information.  Fact Sheet for Patients:   RoadLapTop.co.za  Fact Sheet for Healthcare Providers: http://kim-miller.com/  This test is not yet approved or  cleared by the [place] FDA and has been authorized for detection and/or diagnosis of SARS-CoV-2 by FDA under an Emergency Use Authorization (EUA).  This EUA will remain in effect (meaning this test can be used) for the duration of the COVID-19 declaration under Section 564(b)(1) of the Act, 21 U.S.C. section 360bbb-3(b)(1), unless the authorization is terminated or revoked sooner.  Performed at [facility], [address] [place], [place] [address]   MRSA Next Gen by PCR, Nasal     Status: None   Collection Time: 08/31/22  8:51 PM   Specimen: Nasal Mucosa; Nasal Swab  Result Value Ref Range Status   MRSA by PCR Next Gen NOT DETECTED NOT DETECTED Final    Comment: (NOTE) The GeneXpert MRSA Assay (FDA approved for NASAL specimens only), is one component of a comprehensive MRSA colonization surveillance program. It is not intended to diagnose MRSA infection nor to guide or monitor treatment for MRSA infections. Test performance is not FDA approved in patients less than 3 years old. Performed at [facility], [address], [place], [place] [address]   Urine Culture     Status: Abnormal   Collection Time: 08/31/22  8:51 PM   Specimen: Urine, Random  Result Value Ref Range Status   Specimen Description   Final     URINE, RANDOM Performed at [facility], [address] [place], [place] [address]    Special Requests   Final    NONE Reflexed from [id number] Performed at [facility], [address], [place], [place] [address]    Culture 20,000 COLONIES/mL PSEUDOMONAS AERUGINOSA (A)  Final   Report Status 09/03/2022 FINAL  Final   Organism ID, Bacteria PSEUDOMONAS AERUGINOSA (A)  Final      Susceptibility   Pseudomonas aeruginosa - MIC*    CEFTAZIDIME 16 INTERMEDIATE Intermediate     CIPROFLOXACIN 1 INTERMEDIATE Intermediate     GENTAMICIN <=1 SENSITIVE Sensitive     IMIPENEM 2 SENSITIVE Sensitive     * 20,000 COLONIES/mL PSEUDOMONAS AERUGINOSA  Culture, blood (single)     Status: Abnormal   Collection Time: 09/16/22  2:08 PM   Specimen: BLOOD RIGHT FOREARM  Result Value Ref Range Status   Specimen Description   Final    BLOOD RIGHT FOREARM Performed at [facility], [address] [place], [place] [address]    Special Requests   Final    BOTTLES DRAWN AEROBIC AND ANAEROBIC Blood Culture adequate volume Performed at [facility], [address] [place], [place] [address]    Culture  Setup Time   Final    GRAM POSITIVE COCCI IN BOTH AEROBIC AND ANAEROBIC BOTTLES CRITICAL RESULT CALLED TO, READ BACK BY AND VERIFIED WITH: Thana Ates [phone] [id number] FCP Performed at [facility], [address] [place], [place] [address]    Culture STREPTOCOCCUS PNEUMONIAE (A)  Final   Report Status 09/19/2022 FINAL  Final   Organism ID, Bacteria STREPTOCOCCUS PNEUMONIAE  Final      Susceptibility   Streptococcus pneumoniae - MIC*    ERYTHROMYCIN >=8 RESISTANT Resistant     LEVOFLOXACIN 0.5 SENSITIVE Sensitive     VANCOMYCIN 0.5 SENSITIVE Sensitive     PENICILLIN (meningitis) 1 RESISTANT Resistant     PENO - penicillin 1      PENICILLIN (non-meningitis) 1 SENSITIVE Sensitive     PENICILLIN (oral) 1 INTERMEDIATE  Intermediate     CEFTRIAXONE (non-meningitis) 0.5 SENSITIVE Sensitive     CEFTRIAXONE (meningitis) 0.5 SENSITIVE Sensitive     * STREPTOCOCCUS PNEUMONIAE  Blood Culture ID Panel (Reflexed)     Status: Abnormal   Collection Time: 09/16/22  2:08 PM  Result Value Ref Range Status   Enterococcus faecalis NOT DETECTED NOT DETECTED Final   Enterococcus Faecium NOT DETECTED NOT DETECTED Final   Listeria monocytogenes NOT DETECTED NOT DETECTED Final  Staphylococcus species NOT DETECTED NOT DETECTED Final   Staphylococcus aureus (BCID) NOT DETECTED NOT DETECTED Final   Staphylococcus epidermidis NOT DETECTED NOT DETECTED Final   Staphylococcus lugdunensis NOT DETECTED NOT DETECTED Final   Streptococcus species DETECTED (A) NOT DETECTED Final    Comment: CRITICAL RESULT CALLED TO, READ BACK BY AND VERIFIED WITH: PHARMD J. GADHIA [phone] FCP    Streptococcus agalactiae NOT DETECTED NOT DETECTED Final   Streptococcus pneumoniae DETECTED (A) NOT DETECTED Final    Comment: CRITICAL RESULT CALLED TO, READ BACK BY AND VERIFIED WITH: PHARMD J. Teodoro Kil [phone] FCP    Streptococcus pyogenes NOT DETECTED NOT DETECTED Final   A.calcoaceticus-baumannii NOT DETECTED NOT DETECTED Final   Bacteroides fragilis NOT DETECTED NOT DETECTED Final   Enterobacterales NOT DETECTED NOT DETECTED Final   Enterobacter cloacae complex NOT DETECTED NOT DETECTED Final   Escherichia coli NOT DETECTED NOT DETECTED Final   Klebsiella aerogenes NOT DETECTED NOT DETECTED Final   Klebsiella oxytoca NOT DETECTED NOT DETECTED Final   Klebsiella pneumoniae NOT DETECTED NOT DETECTED Final   Proteus species NOT DETECTED NOT DETECTED Final   Salmonella species NOT DETECTED NOT DETECTED Final   Serratia marcescens NOT DETECTED NOT DETECTED Final   Haemophilus influenzae NOT DETECTED NOT DETECTED Final   Neisseria meningitidis NOT DETECTED NOT DETECTED Final   Pseudomonas aeruginosa NOT DETECTED NOT DETECTED Final    Stenotrophomonas maltophilia NOT DETECTED NOT DETECTED Final   Candida albicans NOT DETECTED NOT DETECTED Final   Candida auris NOT DETECTED NOT DETECTED Final   Candida glabrata NOT DETECTED NOT DETECTED Final   Candida krusei NOT DETECTED NOT DETECTED Final   Candida parapsilosis NOT DETECTED NOT DETECTED Final   Candida tropicalis NOT DETECTED NOT DETECTED Final   Cryptococcus neoformans/gattii NOT DETECTED NOT DETECTED Final    Comment: Performed at [facility], [address], [place], [place] [address]  MRSA Next Gen by PCR, Nasal     Status: None   Collection Time: 09/16/22  5:38 PM   Specimen: Nasal Mucosa; Nasal Swab  Result Value Ref Range Status   MRSA by PCR Next Gen NOT DETECTED NOT DETECTED Final    Comment: (NOTE) The GeneXpert MRSA Assay (FDA approved for NASAL specimens only), is one component of a comprehensive MRSA colonization surveillance program. It is not intended to diagnose MRSA infection nor to guide or monitor treatment for MRSA infections. Test performance is not FDA approved in patients less than 62 years old. Performed at [facility], [address] [place], [place] [address]   Respiratory (~20 pathogens) panel by PCR     Status: Abnormal   Collection Time: 09/16/22  6:47 PM   Specimen: Nasopharyngeal Swab; Respiratory  Result Value Ref Range Status   Adenovirus NOT DETECTED NOT DETECTED Final   Coronavirus 229E NOT DETECTED NOT DETECTED Final    Comment: (NOTE) The Coronavirus on the Respiratory Panel, DOES NOT test for the novel  Coronavirus (2019 nCoV)    Coronavirus HKU1 NOT DETECTED NOT DETECTED Final   Coronavirus NL63 NOT DETECTED NOT DETECTED Final   Coronavirus OC43 NOT DETECTED NOT DETECTED Final   Metapneumovirus NOT DETECTED NOT DETECTED Final   Rhinovirus / Enterovirus DETECTED (A) NOT DETECTED Final   Influenza A NOT DETECTED NOT DETECTED Final   Influenza B NOT DETECTED NOT DETECTED Final   Parainfluenza  Virus 1 NOT DETECTED NOT DETECTED Final   Parainfluenza Virus 2 NOT DETECTED NOT DETECTED Final  Parainfluenza Virus 3 NOT DETECTED NOT DETECTED Final   Parainfluenza Virus 4 NOT DETECTED NOT DETECTED Final   Respiratory Syncytial Virus NOT DETECTED NOT DETECTED Final   Bordetella pertussis NOT DETECTED NOT DETECTED Final   Bordetella Parapertussis NOT DETECTED NOT DETECTED Final   Chlamydophila pneumoniae NOT DETECTED NOT DETECTED Final   Mycoplasma pneumoniae NOT DETECTED NOT DETECTED Final    Comment: Performed at [facility], [address] [place], [place] [address]  Culture, blood (single) w Reflex to ID Panel     Status: None   Collection Time: 09/16/22  7:14 PM   Specimen: BLOOD  Result Value Ref Range Status   Specimen Description   Final    BLOOD BLOOD RIGHT ARM Performed at [facility], [address], [place], [place] [address]    Special Requests   Final    BOTTLES DRAWN AEROBIC ONLY Blood Culture adequate volume Performed at [facility], [address], [place], [place] [address]    Culture   Final    NO GROWTH 5 DAYS Performed at [facility], [address] [place], [place] [address]    Report Status 09/21/2022 FINAL  Final  Culture, blood (single) w Reflex to ID Panel     Status: None   Collection Time: 09/16/22  7:18 PM   Specimen: BLOOD  Result Value Ref Range Status   Specimen Description   Final    BLOOD BLOOD LEFT HAND Performed at [facility], [address], [place], [place] [address]    Special Requests   Final    BOTTLES DRAWN AEROBIC ONLY Blood Culture adequate volume Performed at [facility], [address], [place], [place] [address]    Culture   Final    NO GROWTH 5 DAYS Performed at [facility], [address], [place], [place] [address]    Report Status 09/21/2022 FINAL  Final  Culture, blood (Routine X 2) w Reflex to ID Panel     Status:  None (Preliminary result)   Collection Time: 09/18/22  3:33 PM   Specimen: BLOOD LEFT HAND  Result Value Ref Range Status   Specimen Description   Final    BLOOD LEFT HAND Performed at [facility], [address], [place], [place] [address]    Special Requests   Final    AEROBIC BOTTLE ONLY Blood Culture adequate volume Performed at [facility], [address], [place], [place] [address]    Culture   Final    NO GROWTH 3 DAYS Performed at [facility], [address], [place], [place] [address]    Report Status PENDING  Incomplete  Culture, blood (Routine X 2) w Reflex to ID Panel     Status: None (Preliminary result)   Collection Time: 09/18/22  3:43 PM   Specimen: BLOOD LEFT HAND  Result Value Ref Range Status   Specimen Description   Final    BLOOD LEFT HAND Performed at [facility], [address] [place], [place] [address]    Special Requests   Final    AEROBIC BOTTLE ONLY Blood Culture adequate volume Performed at [facility], [address] [place], [place] [address]    Culture   Final    NO GROWTH 3 DAYS Performed at [facility], [address], [place], [place] [address]    Report Status PENDING  Incomplete  Studies/Results: No results found.    Assessment/Plan:  INTERVAL HISTORY: pt to have TEE this week (Kayla [name] believes today)   Principal Problem:   Pneumococcal bacteremia Active Problems:   Acute on chronic respiratory failure with hypoxia (HCC)   HCAP (healthcare-associated pneumonia)    Kayla [name] is a 72 y.o. female with  hx of MS, agammaglobulinemia previously on immunoglobulin replacement (via subcutaneous route) admitted with current pneumonia and bacteremia due to pneumococcus  #1 Recurrent coccal bacteremia:  Agree that Kayla [name] needs a transesophageal echocardiogram.  I will order complement levels though I suspect her immunoglobulin deficiency  is the driver of her recurrent pneumococcal bacteremia's.  I would favor dosing her with IV immunoglobulin while Kayla [name] was in the hospital so that we know Kayla [name] leaves with a good amount of antibody in her system.  Kayla [name] currently is scheduled for her first visit with Laurette Schimke, MD with [facility]--previously followed in [place]  Continue ceftriaxone  I have personally spent 52 minutes involved in face-to-face and non-face-to-face activities for this patient on the day of the visit. Professional time spent includes the following activities: Preparing to see the patient (review of tests), Obtaining and/or reviewing separately obtained history (admission/discharge record), Performing a medically appropriate examination and/or evaluation , Ordering medications/tests/procedures, referring and communicating with other health care professionals, Documenting clinical information in the EMR, Independently interpreting results (not separately reported), Communicating results to the patient/family/caregiver, Counseling and educating the patient/family/caregiver and Care coordination (not separately reported).     LOS: 6 days   Acey Lav 09/22/2022, 11:15 AM

## 2022-09-22 NOTE — Progress Notes (Signed)
NAME:  Kayla [name], MRN:  [id number], DOB:  10-21-1950, LOS: 6 ADMISSION DATE:  09/16/2022, CONSULTATION DATE:  09/22/22  REFERRING MD:  Dr Rosalia Hammers, MD , CHIEF COMPLAINT:  SOB   History of Present Illness:  Kayla [name] is a 72 year old female with PMH notable for Multiple sclerosis, hiatal hernia, bilateral vocal cord immobility, pneumonia, immunodeficiency on IVIG, hypothyroidism, urine retention, GERD, cervical cancer, depression, insomnia. Pt is notably followed by Dr. Marchelle Gearing in the pulmonary clinic for bronchiectasis and astham with chronic cough. Was found to have worsening LLL infiltrate at visit on 08/12/22. Pt has bronchoscopy with bilateral washings of the mainstem bronchi, BAL of Lt and Rt lower lobes. BAL was positive for pneumococcus and treated with Augmentin. Pt had a history of immunodeficiency on IVIG, but this was stopped early in 2024. She presented to [facility] ER on 6/09 with progressive dyspnea and wheezing. Pt was admitted for aspiration PNA and specifically RLL with multifocal mucus plugging. RVP positive for rhinovirus. Cultures were positive and PCCM was consulted to assist with respiratory management. Pt was treated with ABX, steroids, mucolytic's and chest physiotherapy. She was subsequently discharged on 09/04/2022 and advised to follow up with her PCP. Pt states she followed up with PCP last week and stopped her Ceftin ABX therapy on day 8 of a 10 day course due to some adverse reactions secondary to Ceftin- as stated by pt. She has Immunodeficiency on IVIG and was receiving IVIG 11/2020- stopped 05/2022. Pt stopped her infusions for an unknown reason at that time. Since then she has had recurrent pneumonias. She also had pseudomonas in urine culture this admission which is thought to be colonization.  Today 09/16/2022 pt arrives back to pulmonolgy for follow up. Upon arrival she was in respiratory distress. Saturations were 81% on RA and she has audible wheezing accompanied with  coughing. Pt was noted to be grasping for air at that time utilizing accessory muscles, nasal flaring, and sternal retractions noted. She was not discharged with oxygen, and her sats have been very low (in the 70's) for 24-48 hours per her husband who is also at bedside. They have not been using albuterol nebulizers. Pt was placed on 4L with initial adequate response in SPO2 however required another bump to 6L to maintain saturations 96-100%.   PCCM was consulted for ongoing treatment and assistance of management with above issues.  Pertinent  Medical History  Multiple sclerosis, hiatal hernia, bilateral vocal cord immobility, pneumonia, immunodeficiency on IVIG, hypothyroidism, urine retention, GERD, cervical cancer, depression, insomnia.  Significant Hospital Events: Including procedures, antibiotic start and stop dates in addition to other pertinent events   6/09- admit 6/10 speech therapy-- normal swallow assessment 6/11- palliative care consulted; itching? From zosyn-- changed to ceftriaxone 6/13- pt discharged not on home o2- sent with px for Ceftin 10 day course (completed 8 days) with no improvement in symptoms. 6/25- re-admit to hospital with hypoxic RF and SOB.  6/26 -Bcx with strep pneumo.  6/27 weaned to room air. ID consult   Interim History / Subjective:  NAEON, feels improved overall from respiraotry standpoint, down to 2L this AM   Objective   Blood pressure (!) 103/49, pulse 73, temperature 98.4 F (36.9 C), temperature source Oral, resp. rate 20, height 5\' 3"  (1.6 m), weight 49 kg, SpO2 97 %.        Intake/Output Summary (Last 24 hours) at 09/22/2022 0958 Last data filed at 09/22/2022 0735 Gross per 24 hour  Intake 100  ml  Output 1876 ml  Net -1776 ml    Filed Weights   09/20/22 0707 09/21/22 0628 09/22/22 0500  Weight: 45.2 kg 48.4 kg 49 kg    Examination:  General -thin elderly woman, laying in bed, somewhat frail, no distress Neuro -awake, alert, interacts  appropriately, follows commands, globally weak HEENT-oropharynx moist, hoarse voice Pulm-coarse bilaterally, no wheezes or crackles CV-regular, no murmur GI-thin, nondistended, nontender MSK-no deformities Skin -no rash  Resolved Hospital Problem list   N/A  Assessment & Plan:   Acute hypoxic respiratory failure  Asthma, bronchiectasis CAP (strep pneumo and rhinovirus) Vocal cord paresis and dysfunction  P -Wean oxygen as able, goal O2 at 88% goal to wean off O2 -Continue to push pulmonary hygiene, I-S, flutter valve, bed chest PT -BD regimen: Roxy Manns, albuterol as needed -Continue budesonide nebs as ordered -abx per primary and ID  Immunodeficiency  -goal had been to resume IVIG, has been off since march 2024 when hospitalized for pubic ramus fx, likley large contributor to recurrent respiratory infections  PCCM will sign off  Best Practice (right click and "Reselect all SmartList Selections" daily)   Diet/type: Regular consistency (see orders) DVT prophylaxis: other GI prophylaxis: N/A Lines: N/A Foley:  N/A Code Status:  full code Last date of multidisciplinary goals of care discussion [09/22/22 ]  Labs   CBC: Recent Labs  Lab 09/16/22 1307 09/17/22 0325 09/18/22 0319 09/19/22 0738 09/21/22 0710 09/22/22 0435  WBC 40.0* 23.7* 23.4* 14.2* 12.9* 13.5*  NEUTROABS 36.8*  --   --   --  8.6* 8.8*  HGB 10.4* 9.0* 9.6* 9.1* 8.9* 8.8*  HCT 32.6* 30.7* 31.7* 29.2* 28.9* 28.8*  MCV 90.1 96.5 94.1 92.4 92.6 93.5  PLT 815* 622* 753* 727* 655* 607*     Basic Metabolic Panel: Recent Labs  Lab 09/16/22 1307 09/17/22 0325 09/18/22 0319 09/21/22 0710 09/22/22 0435  NA 129* 136 138 135 138  K 3.9 4.3 4.0 4.0 4.4  CL 92* 105 103 96* 95*  CO2 24 22 26 30 31   GLUCOSE 127* 168* 103* 103* 101*  BUN 12 14 13 11 11   CREATININE 0.74 0.53 0.52 0.48 0.57  CALCIUM 9.5 9.1 9.5 8.9 9.4  MG  --  1.7 1.7 1.8 1.9  PHOS  --   --   --  3.7 3.4    GFR: Estimated  Creatinine Clearance: 50.6 mL/min (by C-G formula based on SCr of 0.57 mg/dL). Recent Labs  Lab 09/16/22 1312 09/16/22 1918 09/17/22 0325 09/18/22 0319 09/19/22 0738 09/21/22 0710 09/22/22 0435  PROCALCITON  --  0.41  --   --   --   --   --   WBC  --   --    < > 23.4* 14.2* 12.9* 13.5*  LATICACIDVEN 1.4  --   --   --   --   --   --    < > = values in this interval not displayed.     Liver Function Tests: Recent Labs  Lab 09/16/22 1307 09/21/22 0710 09/22/22 0435  AST 19 17 18   ALT 17 18 17   ALKPHOS 109 65 63  BILITOT 0.5 0.1* 0.2*  PROT 6.8 5.1* 5.1*  ALBUMIN 2.8* 2.2* 2.3*    No results for input(s): "LIPASE", "AMYLASE" in the last 168 hours. No results for input(s): "AMMONIA" in the last 168 hours.  ABG    Component Value Date/Time   PHART 7.450 09/29/2020 0445   PCO2ART 35.4 09/29/2020 0445  PO2ART 77.6 (L) 09/29/2020 0445   HCO3 27.9 09/16/2022 1312   TCO2 26 01/07/2017 1617   ACIDBASEDEF 5.3 (H) 02/24/2013 1625   O2SAT 70.8 09/16/2022 1312     Coagulation Profile: No results for input(s): "INR", "PROTIME" in the last 168 hours.  Cardiac Enzymes: No results for input(s): "CKTOTAL", "CKMB", "CKMBINDEX", "TROPONINI" in the last 168 hours.  HbA1C: Hgb A1c MFr Bld  Date/Time Value Ref Range Status  02/25/2013 08:35 AM 5.6 <5.7 % Final    Comment:    (NOTE)                                                                       According to the ADA Clinical Practice Recommendations for 2011, when HbA1c is used as a screening test:  >=6.5%   Diagnostic of Diabetes Mellitus           (if abnormal result is confirmed) 5.7-6.4%   Increased risk of developing Diabetes Mellitus References:Diagnosis and Classification of Diabetes Mellitus,Diabetes Care,2011,34(Suppl 1):S62-S69 and Standards of Medical Care in         Diabetes - 2011,Diabetes Care,2011,34 (Suppl 1):S11-S61.    CBG: No results for input(s): "GLUCAP" in the last 168 hours.  CCT:  na  Karren Burly, MD 09/22/2022, 9:58 AM [facility] See Amion or if no answer before 7:00PM call [phone] For any issues after 7:00PM please call eLink [phone]

## 2022-09-22 NOTE — Transfer of Care (Signed)
Immediate Anesthesia Transfer of Care Note  Patient: Kayla [name]  Procedure(s) Performed: TRANSESOPHAGEAL ECHOCARDIOGRAM  Patient Location: Cath Lab  Anesthesia Type:MAC  Level of Consciousness: awake  Airway & Oxygen Therapy: Patient Spontanous Breathing and Patient connected to nasal cannula oxygen  Post-op Assessment: Report given to RN and Post -op Vital signs reviewed and stable  Post vital signs: Reviewed and stable  Last Vitals:  Vitals Value Taken Time  BP 104/52 09/22/22 1503  Temp    Pulse 89 09/22/22 1504  Resp 21 09/22/22 1504  SpO2 96 % 09/22/22 1504  Vitals shown include unvalidated device data.  Last Pain:  Vitals:   09/22/22 1415  TempSrc: Temporal  PainSc: 0-No pain      Patients Stated Pain Goal: 0 (09/17/22 1600)  Complications: No notable events documented.

## 2022-09-22 NOTE — Progress Notes (Signed)
   09/22/22 1956  BiPAP/CPAP/SIPAP  Reason BIPAP/CPAP not in use Non-compliant   Asked PT about cpap use, She's states she does not use one.

## 2022-09-22 NOTE — Progress Notes (Signed)
PROGRESS NOTE    Kayla [name]  WUJ:[id number] DOB: 1950/12/11 DOA: 09/16/2022 PCP: Kayla Grandchild, MD  Chief Complaint  Patient presents with   Shortness of Breath    Brief Narrative:   Kayla [name] is Kayla [name] 72 y.o. female with medical history significant for multiple sclerosis, bilateral vocal cord immobility, immunodeficiency on IVIG, hypothyroidism, GERD, recently admitted to [facility] from 6/9 to 6/13 for aspiration pneumonia, right lower lobe multifocal mucous plugging, rhinovirus positive and also found to have strep pneumo bacteremia.  She was treated in the hospital, weaned to room air, and discharged home to complete 10 more days of p.o. Ceftin.  Patient states she did not really improve after leaving the hospital.  She was having some side effects, so cefdinir was stopped on day 8 by her PCP.  She went to her outpatient pulmonology follow-up today, was noted to have sats of 84%, EMS was called, given her bronchodilator and brought to the emergency department.   ED Course: Patient saturating 99% on 4 L nasal cannula on arrival to the ER, tachycardic heart rate 102.  Blood pressure 113/51.  Lab work shows WBC 40 K, hemoglobin 10 and stable, platelets 815.  Hyponatremia 129, electrolytes and renal function at normal baseline.  Chest x-ray demonstrates small bilateral pleural effusion, bilateral pneumonia right sided consolidation slightly worse than the left.  Patient was given empiric IV vancomycin, and IV cefepime, started on IV fluids, and hospitalist was contacted for admission.  Pulmonology was also consulted prior to hospitalist.  Assessment & Plan:   Active Problems:   Acute on chronic respiratory failure with hypoxia (HCC)   HCAP (healthcare-associated pneumonia)  Kayla [name] is Kayla [name] 72 y.o. female with medical history significant for multiple sclerosis, bilateral vocal cord immobility, immunodeficiency on IVIG, hypothyroidism, GERD, recent hospital stay for strep pneumo  bacteremia and pneumonia being admitted to the hospital with continued and worsening hypoxic respiratory failure and healthcare acquired pneumonia.   Acute hypoxic respiratory failure  Community Acquired Pneumonia Recurrent vs Nonresolving Pneumonia Rhinovirus Infection  -pulmonary c/s, appreciate recs --RVP with rhinovirus (this was present 2 weeks ago, unclear significance) --sputum cx, urine strep, urine leginella -- strep pneumoniae bacteremia (see below) - wean O2 as tolerated -- broad spectrum abx (narrow as appropriate)  -- scheduled and prn nebs -- flutter valve -- strict I/O, daily weights    Strep Pneumoniae Bacteremia  Recent Strep Pneumo Bacteremia Recent strep pneumo bacteremia, concerning for recurrent or inadequately treated bacteremia Strep pneumoniae bacteremia 6/25, susceptibilities reviewed Appreciate ID consult -> ceftriaxone, follow repeat cultures, TEE Monday -> no vegetations noted, see report  Has appt with Kayla [name] with [facility], Kayla [name] mentions considering dosing with IVIG Echo with normal EF, grade 1 diastolic dysfunction, no evidence of valvular vegetations  Small PFO -- noted, follow outpatient  Hyponatremia-resolved, follow    Depression-Prozac, remeron   GERD-Protonix p.o.   Hypothyroidism-Synthroid 100 mcg p.o. daily   History of immune deficiency, with IVIG therapy- due to MS immunomodulatory therapy.  Followed by Dr. Livingston [name] with Allergy and Immunology in [place], [place].  has been off of IVIG since hospitalization for pubic ramus fracture in March.   Needs to reestablish outpatient for resumption  Anemia- gradual downtrend over last few months Iron (wnl), folate (wnl), b12 (elevated), ferritin (wnl) -> suggestive of chronic disease, will continue to trend   Insomnia- restoril, remeron at bedtime  Overactive Bladder Kayla [name]  Urinary Retention Self cath's at home,  has indwelling foley in at this time -  will remove at discharge  Multiple Sclerosis Followed by neurologist in [place], Kayla [name] per previous note  Bilateral Vocal Cord Immobility with Small Transglottal Airway Followed by Kayla [name] with ENT at [facility]    DVT prophylaxis: lovenox Code Status: full Family Communication: none Disposition:   Status is: Inpatient Remains inpatient appropriate because: need for continued inpatient care   Consultants:  PCCM  Procedures:  TEE TEE   Indication:  Bacteremia   Patient sedated by anesthesia with propofol intravenously Mouth guard placed  Throat numbed with cetacaine spray TEE probe advanced to mid esophagus without difficulty     TV normal  Trivial TR MV normal  AV normal  No AI PV normal LVEF and RVEF normal    LA, LAA without masses   Very small PFO as tested with injection of agitated saline   Normal thoracic aorta   Procedure was without complication.  Antimicrobials:  Anti-infectives (From admission, onward)    Start     Dose/Rate Route Frequency Ordered Stop   09/18/22 0400  vancomycin (VANCOCIN) IVPB 1000 mg/200 mL premix  Status:  Discontinued        1,000 mg 200 mL/hr over 60 Minutes Intravenous Every 36 hours 09/16/22 1809 09/19/22 1055   09/17/22 2200  cefTRIAXone (ROCEPHIN) 2 g in sodium chloride 0.9 % 100 mL IVPB        2 g 200 mL/hr over 30 Minutes Intravenous Every 24 hours 09/17/22 1047     09/17/22 0000  ceFEPIme (MAXIPIME) 2 g in sodium chloride 0.9 % 100 mL IVPB  Status:  Discontinued        2 g 200 mL/hr over 30 Minutes Intravenous Every 24 hours 09/16/22 1638 09/16/22 1639   09/17/22 0000  ceFEPIme (MAXIPIME) 500 mg in dextrose 5 % 50 mL IVPB  Status:  Discontinued        500 mg 110 mL/hr over 30 Minutes Intravenous Every 24 hours 09/16/22 1639 09/16/22 1800   09/16/22 2200  ceFEPIme (MAXIPIME) 2 g in sodium chloride 0.9 % 100 mL IVPB  Status:  Discontinued        2 g 200 mL/hr over 30  Minutes Intravenous Every 12 hours 09/16/22 1802 09/17/22 1047   09/16/22 1500  vancomycin (VANCOREADY) IVPB 750 mg/150 mL        750 mg 150 mL/hr over 60 Minutes Intravenous  Once 09/16/22 1447 09/16/22 1638   09/16/22 1500  ceFEPIme (MAXIPIME) 2 g in sodium chloride 0.9 % 100 mL IVPB        2 g 200 mL/hr over 30 Minutes Intravenous  Once 09/16/22 1451 09/16/22 1549   09/16/22 1445  ceFEPIme (MAXIPIME) 1 g in sodium chloride 0.9 % 100 mL IVPB  Status:  Discontinued        1 g 200 mL/hr over 30 Minutes Intravenous  Once 09/16/22 1438 09/16/22 1452       Subjective: Frustrated with wait, NPO status   Objective: Vitals:   09/20/22 2110 09/21/22 0628 09/21/22 0732 09/21/22 1516  BP:  (!) 116/58  (!) 104/50  Pulse:  79  83  Resp:  18  20  Temp:  98.4 F (36.9 C)  97.8 F (36.6 C)  TempSrc:  Oral    SpO2: 99% 97% 95% 93%  Weight:  48.4 kg    Height:        Intake/Output Summary (Last 24 hours) at 09/21/2022  1718 Last data filed at 09/21/2022 0700 Gross per 24 hour  Intake --  Output 800 ml  Net -800 ml   Filed Weights   09/18/22 0500 09/20/22 0707 09/21/22 0628  Weight: 44.4 kg 45.2 kg 48.4 kg    Examination:  General: No acute distress. Cardiovascular: RRR Lungs: unlabored Neurological: Alert and oriented 3. Moves all extremities 4 with equal strength. Cranial nerves II through XII grossly intact. Extremities: No clubbing or cyanosis. No edema.   Data Reviewed: I have personally reviewed following labs and imaging studies  CBC: Recent Labs  Lab 09/16/22 1307 09/17/22 0325 09/18/22 0319 09/19/22 0738 09/21/22 0710  WBC 40.0* 23.7* 23.4* 14.2* 12.9*  NEUTROABS 36.8*  --   --   --  8.6*  HGB 10.4* 9.0* 9.6* 9.1* 8.9*  HCT 32.6* 30.7* 31.7* 29.2* 28.9*  MCV 90.1 96.5 94.1 92.4 92.6  PLT 815* 622* 753* 727* 655*    Basic Metabolic Panel: Recent Labs  Lab 09/16/22 1307 09/17/22 0325 09/18/22 0319 09/21/22 0710  NA 129* 136 138 135  K 3.9 4.3 4.0 4.0   CL 92* 105 103 96*  CO2 24 22 26 30   GLUCOSE 127* 168* 103* 103*  BUN 12 14 13 11   CREATININE 0.74 0.53 0.52 0.48  CALCIUM 9.5 9.1 9.5 8.9  MG  --  1.7 1.7 1.8  PHOS  --   --   --  3.7    GFR: Estimated Creatinine Clearance: 50 mL/min (by C-G formula based on SCr of 0.48 mg/dL).  Liver Function Tests: Recent Labs  Lab 09/16/22 1307 09/21/22 0710  AST 19 17  ALT 17 18  ALKPHOS 109 65  BILITOT 0.5 0.1*  PROT 6.8 5.1*  ALBUMIN 2.8* 2.2*    CBG: No results for input(s): "GLUCAP" in the last 168 hours.   Recent Results (from the past 240 hour(s))  Culture, blood (single)     Status: Abnormal   Collection Time: 09/16/22  2:08 PM   Specimen: BLOOD RIGHT FOREARM  Result Value Ref Range Status   Specimen Description   Final    BLOOD RIGHT FOREARM Performed at [facility], [address], [place], [place] [address]    Special Requests   Final    BOTTLES DRAWN AEROBIC AND ANAEROBIC Blood Culture adequate volume Performed at [facility], [address], [place], [place] [address]    Culture  Setup Time   Final    GRAM POSITIVE COCCI IN BOTH AEROBIC AND ANAEROBIC BOTTLES CRITICAL RESULT CALLED TO, READ BACK BY AND VERIFIED WITH: Kayla [name] [phone] FCP Performed at [facility], [address] [place], [place] [address]    Culture STREPTOCOCCUS PNEUMONIAE (Arnice Vanepps)  Final   Report Status 09/19/2022 FINAL  Final   Organism ID, Bacteria STREPTOCOCCUS PNEUMONIAE  Final      Susceptibility   Streptococcus pneumoniae - MIC*    ERYTHROMYCIN >=8 RESISTANT Resistant     LEVOFLOXACIN 0.5 SENSITIVE Sensitive     VANCOMYCIN 0.5 SENSITIVE Sensitive     PENICILLIN (meningitis) 1 RESISTANT Resistant     PENO - penicillin 1      PENICILLIN (non-meningitis) 1 SENSITIVE Sensitive     PENICILLIN (oral) 1 INTERMEDIATE Intermediate     CEFTRIAXONE (non-meningitis) 0.5 SENSITIVE Sensitive     CEFTRIAXONE (meningitis) 0.5 SENSITIVE  Sensitive     * STREPTOCOCCUS PNEUMONIAE  Blood Culture ID Panel (Reflexed)     Status: Abnormal   Collection  Time: 09/16/22  2:08 PM  Result Value Ref Range Status   Enterococcus faecalis NOT DETECTED NOT DETECTED Final   Enterococcus Faecium NOT DETECTED NOT DETECTED Final   Listeria monocytogenes NOT DETECTED NOT DETECTED Final   Staphylococcus species NOT DETECTED NOT DETECTED Final   Staphylococcus aureus (BCID) NOT DETECTED NOT DETECTED Final   Staphylococcus epidermidis NOT DETECTED NOT DETECTED Final   Staphylococcus lugdunensis NOT DETECTED NOT DETECTED Final   Streptococcus species DETECTED (Bernyce Brimley) NOT DETECTED Final    Comment: CRITICAL RESULT CALLED TO, READ BACK BY AND VERIFIED WITH: Kayla [name] [phone] FCP    Streptococcus agalactiae NOT DETECTED NOT DETECTED Final   Streptococcus pneumoniae DETECTED (Bladimir Auman) NOT DETECTED Final    Comment: CRITICAL RESULT CALLED TO, READ BACK BY AND VERIFIED WITH: Kayla [name] [phone] FCP    Streptococcus pyogenes NOT DETECTED NOT DETECTED Final   Kimmie Berggren.calcoaceticus-baumannii NOT DETECTED NOT DETECTED Final   Bacteroides fragilis NOT DETECTED NOT DETECTED Final   Enterobacterales NOT DETECTED NOT DETECTED Final   Enterobacter cloacae complex NOT DETECTED NOT DETECTED Final   Escherichia coli NOT DETECTED NOT DETECTED Final   Klebsiella aerogenes NOT DETECTED NOT DETECTED Final   Klebsiella oxytoca NOT DETECTED NOT DETECTED Final   Klebsiella pneumoniae NOT DETECTED NOT DETECTED Final   Proteus species NOT DETECTED NOT DETECTED Final   Salmonella species NOT DETECTED NOT DETECTED Final   Serratia marcescens NOT DETECTED NOT DETECTED Final   Haemophilus influenzae NOT DETECTED NOT DETECTED Final   Neisseria meningitidis NOT DETECTED NOT DETECTED Final   Pseudomonas aeruginosa NOT DETECTED NOT DETECTED Final   Stenotrophomonas maltophilia NOT DETECTED NOT DETECTED Final   Candida albicans NOT DETECTED NOT DETECTED Final   Candida  auris NOT DETECTED NOT DETECTED Final   Candida glabrata NOT DETECTED NOT DETECTED Final   Candida krusei NOT DETECTED NOT DETECTED Final   Candida parapsilosis NOT DETECTED NOT DETECTED Final   Candida tropicalis NOT DETECTED NOT DETECTED Final   Cryptococcus neoformans/gattii NOT DETECTED NOT DETECTED Final    Comment: Performed at [facility], [address] [place], [place] [address]  MRSA Next Gen by PCR, Nasal     Status: None   Collection Time: 09/16/22  5:38 PM   Specimen: Nasal Mucosa; Nasal Swab  Result Value Ref Range Status   MRSA by PCR Next Gen NOT DETECTED NOT DETECTED Final    Comment: (NOTE) The GeneXpert MRSA Assay (FDA approved for NASAL specimens only), is one component of Noemi Bellissimo comprehensive MRSA colonization surveillance program. It is not intended to diagnose MRSA infection nor to guide or monitor treatment for MRSA infections. Test performance is not FDA approved in patients less than 43 years old. Performed at [facility], [address] [place], [place] [address]   Respiratory (~20 pathogens) panel by PCR     Status: Abnormal   Collection Time: 09/16/22  6:47 PM   Specimen: Nasopharyngeal Swab; Respiratory  Result Value Ref Range Status   Adenovirus NOT DETECTED NOT DETECTED Final   Coronavirus 229E NOT DETECTED NOT DETECTED Final    Comment: (NOTE) The Coronavirus on the Respiratory Panel, DOES NOT test for the novel  Coronavirus (2019 nCoV)    Coronavirus HKU1 NOT DETECTED NOT DETECTED Final   Coronavirus NL63 NOT DETECTED NOT DETECTED Final   Coronavirus OC43 NOT DETECTED NOT DETECTED Final   Metapneumovirus NOT DETECTED NOT DETECTED Final   Rhinovirus / Enterovirus DETECTED (Kayla [name]) NOT DETECTED Final  Influenza Kayla [name] [place] NOT DETECTED NOT DETECTED Final   Influenza B NOT DETECTED NOT DETECTED Final   Parainfluenza Virus 1 NOT DETECTED NOT DETECTED Final   Parainfluenza Virus 2 NOT DETECTED NOT DETECTED Final   Parainfluenza Virus 3  NOT DETECTED NOT DETECTED Final   Parainfluenza Virus 4 NOT DETECTED NOT DETECTED Final   Respiratory Syncytial Virus NOT DETECTED NOT DETECTED Final   Bordetella pertussis NOT DETECTED NOT DETECTED Final   Bordetella Parapertussis NOT DETECTED NOT DETECTED Final   Chlamydophila pneumoniae NOT DETECTED NOT DETECTED Final   Mycoplasma pneumoniae NOT DETECTED NOT DETECTED Final    Comment: Performed at [facility], [address], [place], [place] [address]  Culture, blood (single) w Reflex to ID Panel     Status: None   Collection Time: 09/16/22  7:14 PM   Specimen: BLOOD  Result Value Ref Range Status   Specimen Description   Final    BLOOD BLOOD RIGHT ARM Performed at [facility], [address] [place], [place] [address]    Special Requests   Final    BOTTLES DRAWN AEROBIC ONLY Blood Culture adequate volume Performed at [facility], [address] [place], [place] [address]    Culture   Final    NO GROWTH 5 DAYS Performed at [facility], [address], [place], [place] [address]    Report Status 09/21/2022 FINAL  Final  Culture, blood (single) w Reflex to ID Panel     Status: None   Collection Time: 09/16/22  7:18 PM   Specimen: BLOOD  Result Value Ref Range Status   Specimen Description   Final    BLOOD BLOOD LEFT HAND Performed at [facility], [address] [place], [place] [address]    Special Requests   Final    BOTTLES DRAWN AEROBIC ONLY Blood Culture adequate volume Performed at [facility], [address], [place], [place] [address]    Culture   Final    NO GROWTH 5 DAYS Performed at [facility], [address], [place], [place] [address]    Report Status 09/21/2022 FINAL  Final  Culture, blood (Routine X 2) w Reflex to ID Panel     Status: None (Preliminary result)   Collection Time: 09/18/22  3:33 PM   Specimen: BLOOD LEFT HAND  Result Value Ref Range  Status   Specimen Description   Final    BLOOD LEFT HAND Performed at [facility], [address] [place], [place] [address]    Special Requests   Final    AEROBIC BOTTLE ONLY Blood Culture adequate volume Performed at [facility], [address] [place], [place] [address]    Culture   Final    NO GROWTH 3 DAYS Performed at [facility], [address], [place], [place] [address]    Report Status PENDING  Incomplete  Culture, blood (Routine X 2) w Reflex to ID Panel     Status: None (Preliminary result)   Collection Time: 09/18/22  3:43 PM   Specimen: BLOOD LEFT HAND  Result Value Ref Range Status   Specimen Description   Final    BLOOD LEFT HAND Performed at [facility], [address] [place], [place] [address]    Special Requests   Final    AEROBIC BOTTLE ONLY Blood Culture adequate volume Performed at [facility], [address]  [place] [place], [place] [address]    Culture   Final    NO GROWTH 3 DAYS Performed at [facility], [address] [place], [place] [address]    Report Status PENDING  Incomplete         Radiology Studies: No results found.      Scheduled Meds:  arformoterol  15 mcg Nebulization BID   budesonide (PULMICORT) nebulizer solution  0.25 mg Nebulization BID   Chlorhexidine Gluconate Cloth  6 each Topical Daily   enoxaparin (LOVENOX) injection  30 mg Subcutaneous Q24H   feeding supplement  237 mL Oral TID BM   FLUoxetine  40 mg Oral Daily   guaiFENesin  600 mg Oral BID   levothyroxine  100 mcg Oral Q0600   mirabegron ER  50 mg Oral Daily   mirtazapine  15 mg Oral QHS   multivitamin with minerals  1 tablet Oral Daily   pantoprazole  40 mg Oral Daily   revefenacin  175 mcg Nebulization Daily   tamsulosin  0.4 mg Oral Daily   temazepam  15 mg Oral QHS   Continuous Infusions:  sodium chloride Stopped (09/20/22 0904)   cefTRIAXone (ROCEPHIN)  IV 2 g  (09/20/22 2230)     LOS: 5 days    Time spent: over 30 min    Lacretia Nicks, MD [facility]   To contact the attending provider between 7A-7P or the covering provider during after hours 7P-7A, please log into the web site [email or web address] and access using universal [employer] password for that web site. If you do not have the password, please call the hospital operator.  09/21/2022, 5:18 PM

## 2022-09-22 NOTE — Progress Notes (Addendum)
    Informed Consent   Shared Decision Making/Informed Consent  The risks [esophageal damage, perforation (1:10,000 risk), bleeding, pharyngeal hematoma as well as other potential complications associated with conscious sedation including aspiration, arrhythmia, respiratory failure and death], benefits (treatment guidance and diagnostic support) and alternatives of a transesophageal echocardiogram were discussed in detail with Kayla [name] and she is willing to proceed.       NPO for now. TEE today with mechanical intubation planned. INR 1.1 on 08/31/22, will repeat.  CMP grossly OK today. Hgb 8.8 note.

## 2022-09-23 ENCOUNTER — Encounter (HOSPITAL_COMMUNITY): Payer: Self-pay | Admitting: Internal Medicine

## 2022-09-23 DIAGNOSIS — B953 Streptococcus pneumoniae as the cause of diseases classified elsewhere: Secondary | ICD-10-CM | POA: Diagnosis not present

## 2022-09-23 DIAGNOSIS — R7881 Bacteremia: Secondary | ICD-10-CM | POA: Diagnosis not present

## 2022-09-23 DIAGNOSIS — D801 Nonfamilial hypogammaglobulinemia: Secondary | ICD-10-CM

## 2022-09-23 LAB — CBC WITH DIFFERENTIAL/PLATELET
Abs Immature Granulocytes: 0.18 10*3/uL — ABNORMAL HIGH (ref 0.00–0.07)
Basophils Absolute: 0.1 10*3/uL (ref 0.0–0.1)
Basophils Relative: 1 %
Eosinophils Absolute: 0.3 10*3/uL (ref 0.0–0.5)
Eosinophils Relative: 3 %
HCT: 29.6 % — ABNORMAL LOW (ref 36.0–46.0)
Hemoglobin: 9.1 g/dL — ABNORMAL LOW (ref 12.0–15.0)
Immature Granulocytes: 2 %
Lymphocytes Relative: 21 %
Lymphs Abs: 2.1 10*3/uL (ref 0.7–4.0)
MCH: 28.2 pg (ref 26.0–34.0)
MCHC: 30.7 g/dL (ref 30.0–36.0)
MCV: 91.6 fL (ref 80.0–100.0)
Monocytes Absolute: 1 10*3/uL (ref 0.1–1.0)
Monocytes Relative: 10 %
Neutro Abs: 6.3 10*3/uL (ref 1.7–7.7)
Neutrophils Relative %: 63 %
Platelets: 628 10*3/uL — ABNORMAL HIGH (ref 150–400)
RBC: 3.23 MIL/uL — ABNORMAL LOW (ref 3.87–5.11)
RDW: 15.6 % — ABNORMAL HIGH (ref 11.5–15.5)
WBC: 9.9 10*3/uL (ref 4.0–10.5)
nRBC: 0 % (ref 0.0–0.2)

## 2022-09-23 LAB — COMPREHENSIVE METABOLIC PANEL
ALT: 15 U/L (ref 0–44)
AST: 17 U/L (ref 15–41)
Albumin: 2.4 g/dL — ABNORMAL LOW (ref 3.5–5.0)
Alkaline Phosphatase: 69 U/L (ref 38–126)
Anion gap: 9 (ref 5–15)
BUN: 8 mg/dL (ref 8–23)
CO2: 28 mmol/L (ref 22–32)
Calcium: 9 mg/dL (ref 8.9–10.3)
Chloride: 96 mmol/L — ABNORMAL LOW (ref 98–111)
Creatinine, Ser: 0.52 mg/dL (ref 0.44–1.00)
GFR, Estimated: >60 mL/min (ref >60)
Glucose, Bld: 90 mg/dL (ref 70–99)
Potassium: 3.9 mmol/L (ref 3.5–5.1)
Sodium: 133 mmol/L — ABNORMAL LOW (ref 135–145)
Total Bilirubin: 0.3 mg/dL (ref 0.3–1.2)
Total Protein: 5.4 g/dL — ABNORMAL LOW (ref 6.5–8.1)

## 2022-09-23 LAB — PHOSPHORUS: Phosphorus: 3.9 mg/dL (ref 2.5–4.6)

## 2022-09-23 LAB — CULTURE, BLOOD (ROUTINE X 2)

## 2022-09-23 LAB — MAGNESIUM: Magnesium: 2 mg/dL (ref 1.7–2.4)

## 2022-09-23 MED ORDER — COVID-19 MRNA 2023-2024 VACCINE (COMIRNATY) 0.3 ML INJECTION
0.3000 mL | Freq: Once | INTRAMUSCULAR | Status: AC
  Administered 2022-09-23: 0.3 mL via INTRAMUSCULAR
  Filled 2022-09-23 (×2): qty 0.3

## 2022-09-23 NOTE — Anesthesia Postprocedure Evaluation (Signed)
Anesthesia Post Note  Patient: Kayla [name]  Procedure(s) Performed: TRANSESOPHAGEAL ECHOCARDIOGRAM     Patient location during evaluation: Cath Lab Anesthesia Type: MAC Level of consciousness: awake and alert Pain management: pain level controlled Vital Signs Assessment: post-procedure vital signs reviewed and stable Respiratory status: spontaneous breathing, patient connected to nasal cannula oxygen and respiratory function stable Cardiovascular status: stable Postop Assessment: no apparent nausea or vomiting Anesthetic complications: no   No notable events documented.  Last Vitals:  Vitals:   09/23/22 0844 09/23/22 1202  BP:    Pulse:    Resp:    Temp:    SpO2: 96% 97%    Last Pain:  Vitals:   09/23/22 0800  TempSrc:   PainSc: 0-No pain                 Thereasa Iannello

## 2022-09-23 NOTE — Progress Notes (Signed)
   09/23/22 1955  BiPAP/CPAP/SIPAP  Reason BIPAP/CPAP not in use Non-compliant    Pt does not wear cpap at home and does not want to wear one while here in the hospital.  Pt was encouraged to call should she change her mind.

## 2022-09-23 NOTE — Progress Notes (Signed)
Pt was walking during 1200 CPT. Pt's husband stated he would help her perform flutter when she returns to her room.

## 2022-09-23 NOTE — Care Management Important Message (Signed)
Important Message  Patient Details IM Letter given. Name: Kayla [name] MRN: [id number] Date of Birth: 1950/10/29   Medicare Important Message Given:  Yes     Caren Macadam 09/23/2022, 9:27 AM

## 2022-09-23 NOTE — Progress Notes (Signed)
Mobility Specialist - Progress Note   09/23/22 1202  Oxygen Therapy  SpO2 97 %  O2 Device Nasal Cannula  O2 Flow Rate (L/min) 4 L/min  Patient Activity (if Appropriate) Ambulating  Mobility  Activity Ambulated with assistance in hallway  Level of Assistance Standby assist, set-up cues, supervision of patient - no hands on  Assistive Device Front wheel walker  Distance Ambulated (ft) 160 ft  Activity Response Tolerated well  Mobility Referral Yes  $Mobility charge 1 Mobility  Mobility Specialist Start Time (ACUTE ONLY) 1138  Mobility Specialist Stop Time (ACUTE ONLY) 1201  Mobility Specialist Time Calculation (min) (ACUTE ONLY) 23 min   Pt received in recliner and agreeable to mobility. Pt had several coughing bouts throughout session. During session, pt took 1x standing rest break due to generalized fatigued. Pts R foot drags when ambulating. No complaints during session. Pt to recliner after session with all needs met & nurse in room. Chair alarm on.   Pre-mobility: 100 HR, 99% SpO2 (4L [place]) During mobility: 106 HR, 97% SpO2 (4L [place]) Post-mobility: 101 HR, 98% SPO2 (4L [place])  Chief Technology Officer

## 2022-09-23 NOTE — Progress Notes (Signed)
PROGRESS NOTE    [name]  ZOX:[id number] DOB: [birth year] DOA: 09/16/2022 PCP: [name], MD  Chief Complaint  Patient presents with   Shortness of Breath    Brief Narrative:   Kayla [name] is Joushua Dugar 72 y.o. female with medical history significant for multiple sclerosis, bilateral vocal cord immobility, immunodeficiency on IVIG, hypothyroidism, GERD, recently admitted to [facility] from 6/9 to 6/13 for aspiration pneumonia, right lower lobe multifocal mucous plugging, rhinovirus positive and also found to have strep pneumo bacteremia.  She was treated in the hospital, weaned to room air, and discharged home to complete 10 more days of p.o. Ceftin.  Patient states she did not really improve after leaving the hospital.  She was having some side effects, so cefdinir was stopped on day 8 by her PCP.  She went to her outpatient pulmonology follow-up today, was noted to have sats of 84%, EMS was called, given her bronchodilator and brought to the emergency department.   She's been readmitted with community acquired pneumonia.  Again, found to have strep pneumoniae bacteremia.  S/p TEE without vegetation.  ID following.  Likely discharge within 1-2 days.  Assessment & Plan:   Principal Problem:   Pneumococcal bacteremia Active Problems:   Acute on chronic respiratory failure with hypoxia (HCC)   HCAP (healthcare-associated pneumonia)   Hypogammaglobulinemia (HCC)   Recurrent infections  Kayla [name] is [name] 72 y.o. female with medical history significant for multiple sclerosis, bilateral vocal cord immobility, immunodeficiency on IVIG, hypothyroidism, GERD, recent hospital stay for strep pneumo bacteremia and pneumonia being admitted to the hospital with continued and worsening hypoxic respiratory failure and pneumonia.  Found to have recurrent bacteremia.   Acute hypoxic respiratory failure  Community Acquired Pneumonia Recurrent vs Nonresolving Pneumonia Rhinovirus Infection   -pulmonary c/s, appreciate recs --RVP with rhinovirus (this was present 2 weeks ago, unclear significance) --sputum cx, urine strep, urine leginella -- strep pneumoniae bacteremia (see below) - wean O2 as tolerated -- broad spectrum abx (narrow as appropriate)  -- scheduled and prn nebs -- flutter valve -- strict I/O, daily weights    Strep Pneumoniae Bacteremia  Recent Strep Pneumo Bacteremia Recent strep pneumo bacteremia, concerning for recurrent or inadequately treated bacteremia Strep pneumoniae bacteremia 6/25, susceptibilities reviewed Appreciate ID consult -> ceftriaxone, follow repeat cultures, TEE Monday -> no vegetations noted, see report  Has appt with Dr. Lucie Leather with [facility], Dr. Daiva Eves mentions considering dosing with IVIG (will defer to ID/pharmacy) Echo with normal EF, grade 1 diastolic dysfunction, no evidence of valvular vegetations  Small PFO -- noted, follow outpatient  Hyponatremia-resolved, follow    Depression-Prozac, remeron   GERD-Protonix p.o.   Hypothyroidism-Synthroid 100 mcg p.o. daily   History of immune deficiency, with IVIG therapy- due to MS immunomodulatory therapy.  Followed by Dr. Livingston Diones with Allergy and Immunology in [place], [place].  has been off of IVIG since hospitalization for pubic ramus fracture in March.   Has appointment with local provider coming up  Anemia- gradual downtrend over last few months Iron (wnl), folate (wnl), b12 (elevated), ferritin (wnl) -> suggestive of chronic disease, will continue to trend   Insomnia- restoril, remeron at bedtime  Overactive Bladder Myrbetriq  Urinary Retention Self cath's at home, has indwelling foley in at this time - will remove at discharge  Multiple Sclerosis Followed by neurologist in [place], Dr. Konrad Felix per previous note  Bilateral Vocal Cord Immobility with Small Transglottal Airway Followed by Dr. Harriette Ohara  with E[facility] at [facility]    DVT prophylaxis: lovenox Code Status: full Family Communication: none Disposition:   Status is: Inpatient Remains inpatient appropriate because: need for continued inpatient care   Consultants:  PCCM  Procedures:  TEE IMPRESSIONS     1. No vegetations seen.   2. The left ventricle has normal function.   3. Right ventricular systolic function is normal. The right ventricular  size is normal.   4. No left atrial/left atrial appendage thrombus was detected.   5. The mitral valve is normal in structure. Trivial mitral valve  regurgitation.   6. The aortic valve is tricuspid. Aortic valve regurgitation is not  visualized.   7. Evidence of atrial level shunting detected by color flow Doppler.  Agitated saline contrast bubble study was positive with shunting observed  within 3-6 cardiac cycles suggestive of interatrial shunt.   Antimicrobials:  Anti-infectives (From admission, onward)    Start     Dose/Rate Route Frequency Ordered Stop   09/18/22 0400  vancomycin (VANCOCIN) IVPB 1000 mg/200 mL premix  Status:  Discontinued        1,000 mg 200 mL/hr over 60 Minutes Intravenous Every 36 hours 09/16/22 1809 09/19/22 1055   09/17/22 2200  cefTRIAXone (ROCEPHIN) 2 g in sodium chloride 0.9 % 100 mL IVPB        2 g 200 mL/hr over 30 Minutes Intravenous Every 24 hours 09/17/22 1047     09/17/22 0000  ceFEPIme (MAXIPIME) 2 g in sodium chloride 0.9 % 100 mL IVPB  Status:  Discontinued        2 g 200 mL/hr over 30 Minutes Intravenous Every 24 hours 09/16/22 1638 09/16/22 1639   09/17/22 0000  ceFEPIme (MAXIPIME) 500 mg in dextrose 5 % 50 mL IVPB  Status:  Discontinued        500 mg 110 mL/hr over 30 Minutes Intravenous Every 24 hours 09/16/22 1639 09/16/22 1800   09/16/22 2200  ceFEPIme (MAXIPIME) 2 g in sodium chloride 0.9 % 100 mL IVPB  Status:  Discontinued        2 g 200 mL/hr over 30 Minutes Intravenous Every 12 hours 09/16/22 1802 09/17/22 1047   09/16/22  1500  vancomycin (VANCOREADY) IVPB 750 mg/150 mL        750 mg 150 mL/hr over 60 Minutes Intravenous  Once 09/16/22 1447 09/16/22 1638   09/16/22 1500  ceFEPIme (MAXIPIME) 2 g in sodium chloride 0.9 % 100 mL IVPB        2 g 200 mL/hr over 30 Minutes Intravenous  Once 09/16/22 1451 09/16/22 1549   09/16/22 1445  ceFEPIme (MAXIPIME) 1 g in sodium chloride 0.9 % 100 mL IVPB  Status:  Discontinued        1 g 200 mL/hr over 30 Minutes Intravenous  Once 09/16/22 1438 09/16/22 1452       Subjective: No new complaints  Objective: Vitals:   09/23/22 0542 09/23/22 0844 09/23/22 1202 09/23/22 1313  BP: (!) 109/53   (!) 124/54  Pulse: 75   87  Resp: 18   18  Temp: 99.1 F (37.3 C)   98.5 F (36.9 C)  TempSrc: Oral   Oral  SpO2: 98% 96% 97% 97%  Weight:      Height:        Intake/Output Summary (Last 24 hours) at 09/23/2022 1406 Last data filed at 09/23/2022 1055 Gross per 24 hour  Intake 772 ml  Output 1900 ml  Net -1128 ml   Filed Weights   09/21/22 0628 09/22/22 0500 09/23/22 0454  Weight: 48.4 kg 49 kg 46.9 kg    Examination:  General: No acute distress. Cardiovascular: RRR Lungs: unlabored Neurological: Alert and oriented 3. Moves all extremities 4 with equal strength. Cranial nerves II through XII grossly intact. Extremities: No clubbing or cyanosis. No edema.  Data Reviewed: I have personally reviewed following labs and imaging studies  CBC: Recent Labs  Lab 09/18/22 0319 09/19/22 0738 09/21/22 0710 09/22/22 0435 09/23/22 0548  WBC 23.4* 14.2* 12.9* 13.5* 9.9  NEUTROABS  --   --  8.6* 8.8* 6.3  HGB 9.6* 9.1* 8.9* 8.8* 9.1*  HCT 31.7* 29.2* 28.9* 28.8* 29.6*  MCV 94.1 92.4 92.6 93.5 91.6  PLT 753* 727* 655* 607* 628*    Basic Metabolic Panel: Recent Labs  Lab 09/17/22 0325 09/18/22 0319 09/21/22 0710 09/22/22 0435 09/23/22 0548  NA 136 138 135 138 133*  K 4.3 4.0 4.0 4.4 3.9  CL 105 103 96* 95* 96*  CO2 22 26 30 31 28   GLUCOSE 168* 103* 103*  101* 90  BUN 14 13 11 11 8   CREATININE 0.53 0.52 0.48 0.57 0.52  CALCIUM 9.1 9.5 8.9 9.4 9.0  MG 1.7 1.7 1.8 1.9 2.0  PHOS  --   --  3.7 3.4 3.9    GFR: Estimated Creatinine Clearance: 48.4 mL/min (by C-G formula based on SCr of 0.52 mg/dL).  Liver Function Tests: Recent Labs  Lab 09/21/22 0710 09/22/22 0435 09/23/22 0548  AST 17 18 17   ALT 18 17 15   ALKPHOS 65 63 69  BILITOT 0.1* 0.2* 0.3  PROT 5.1* 5.1* 5.4*  ALBUMIN 2.2* 2.3* 2.4*    CBG: No results for input(s): "GLUCAP" in the last 168 hours.   Recent Results (from the past 240 hour(s))  Culture, blood (single)     Status: Abnormal   Collection Time: 09/16/22  2:08 PM   Specimen: BLOOD RIGHT FOREARM  Result Value Ref Range Status   Specimen Description   Final    BLOOD RIGHT FOREARM Performed at [facility], [address], [place], [place] [address]    Special Requests   Final    BOTTLES DRAWN AEROBIC AND ANAEROBIC Blood Culture adequate volume Performed at [facility], [address], [place], [place] [address]    Culture  Setup Time   Final    GRAM POSITIVE COCCI IN BOTH AEROBIC AND ANAEROBIC BOTTLES CRITICAL RESULT CALLED TO, READ BACK BY AND VERIFIED WITH: Thana Ates [phone] FCP Performed at [facility], [address] [place], [place] [address]    Culture STREPTOCOCCUS PNEUMONIAE (Melven Stockard)  Final   Report Status 09/19/2022 FINAL  Final   Organism ID, Bacteria STREPTOCOCCUS PNEUMONIAE  Final      Susceptibility   Streptococcus pneumoniae - MIC*    ERYTHROMYCIN >=8 RESISTANT Resistant     LEVOFLOXACIN 0.5 SENSITIVE Sensitive     VANCOMYCIN 0.5 SENSITIVE Sensitive     PENICILLIN (meningitis) 1 RESISTANT Resistant     PENO - penicillin 1      PENICILLIN (non-meningitis) 1 SENSITIVE Sensitive     PENICILLIN (oral) 1 INTERMEDIATE Intermediate     CEFTRIAXONE (non-meningitis) 0.5 SENSITIVE Sensitive     CEFTRIAXONE (meningitis) 0.5 SENSITIVE Sensitive      * STREPTOCOCCUS PNEUMONIAE  Blood Culture ID Panel (Reflexed)     Status: Abnormal   Collection Time: 09/16/22  2:08 PM  Result Value Ref Range Status   Enterococcus faecalis NOT DETECTED NOT DETECTED Final   Enterococcus Faecium NOT DETECTED NOT DETECTED Final   Listeria monocytogenes NOT DETECTED NOT DETECTED Final   Staphylococcus species NOT DETECTED NOT DETECTED Final   Staphylococcus aureus (BCID) NOT DETECTED NOT DETECTED Final   Staphylococcus epidermidis NOT DETECTED NOT DETECTED Final   Staphylococcus lugdunensis NOT DETECTED NOT DETECTED Final   Streptococcus species DETECTED (Neda Willenbring) NOT DETECTED Final    Comment: CRITICAL RESULT CALLED TO, READ BACK BY AND VERIFIED WITH: PHARMD J. GADHIA [phone] FCP    Streptococcus agalactiae NOT DETECTED NOT DETECTED Final   Streptococcus pneumoniae DETECTED (Braedyn Riggle) NOT DETECTED Final    Comment: CRITICAL RESULT CALLED TO, READ BACK BY AND VERIFIED WITH: PHARMD J. Teodoro Kil [phone] FCP    Streptococcus pyogenes NOT DETECTED NOT DETECTED Final   Nicky Milhouse.calcoaceticus-baumannii NOT DETECTED NOT DETECTED Final   Bacteroides fragilis NOT DETECTED NOT DETECTED Final   Enterobacterales NOT DETECTED NOT DETECTED Final   Enterobacter cloacae complex NOT DETECTED NOT DETECTED Final   Escherichia coli NOT DETECTED NOT DETECTED Final   Klebsiella aerogenes NOT DETECTED NOT DETECTED Final   Klebsiella oxytoca NOT DETECTED NOT DETECTED Final   Klebsiella pneumoniae NOT DETECTED NOT DETECTED Final   Proteus species NOT DETECTED NOT DETECTED Final   Salmonella species NOT DETECTED NOT DETECTED Final   Serratia marcescens NOT DETECTED NOT DETECTED Final   Haemophilus influenzae NOT DETECTED NOT DETECTED Final   Neisseria meningitidis NOT DETECTED NOT DETECTED Final   Pseudomonas aeruginosa NOT DETECTED NOT DETECTED Final   Stenotrophomonas maltophilia NOT DETECTED NOT DETECTED Final   Candida albicans NOT DETECTED NOT DETECTED Final   Candida auris NOT  DETECTED NOT DETECTED Final   Candida glabrata NOT DETECTED NOT DETECTED Final   Candida krusei NOT DETECTED NOT DETECTED Final   Candida parapsilosis NOT DETECTED NOT DETECTED Final   Candida tropicalis NOT DETECTED NOT DETECTED Final   Cryptococcus neoformans/gattii NOT DETECTED NOT DETECTED Final    Comment: Performed at [facility], [address], [place], [place] [address]  MRSA Next Gen by PCR, Nasal     Status: None   Collection Time: 09/16/22  5:38 PM   Specimen: Nasal Mucosa; Nasal Swab  Result Value Ref Range Status   MRSA by PCR Next Gen NOT DETECTED NOT DETECTED Final    Comment: (NOTE) The GeneXpert MRSA Assay (FDA approved for NASAL specimens only), is one component of Kasee Hantz comprehensive MRSA colonization surveillance program. It is not intended to diagnose MRSA infection nor to guide or monitor treatment for MRSA infections. Test performance is not FDA approved in patients less than 10 years old. Performed at [facility], [address], [place], [place] [address]   Respiratory (~20 pathogens) panel by PCR     Status: Abnormal   Collection Time: 09/16/22  6:47 PM   Specimen: Nasopharyngeal Swab; Respiratory  Result Value Ref Range Status   Adenovirus NOT DETECTED NOT DETECTED Final   Coronavirus 229E NOT DETECTED NOT DETECTED Final    Comment: (NOTE) The Coronavirus on the Respiratory Panel, DOES NOT test for the novel  Coronavirus (2019 nCoV)    Coronavirus HKU1 NOT DETECTED NOT DETECTED Final   Coronavirus NL63 NOT DETECTED NOT DETECTED Final   Coronavirus OC43 NOT DETECTED NOT DETECTED Final   Metapneumovirus NOT DETECTED NOT DETECTED Final   Rhinovirus / Enterovirus DETECTED (Adarryl Goldammer) NOT DETECTED Final   Influenza Amritha Yorke NOT DETECTED  NOT DETECTED Final   Influenza B NOT DETECTED NOT DETECTED Final   Parainfluenza Virus 1 NOT DETECTED NOT DETECTED Final   Parainfluenza Virus 2 NOT DETECTED NOT DETECTED Final   Parainfluenza Virus 3 NOT DETECTED  NOT DETECTED Final   Parainfluenza Virus 4 NOT DETECTED NOT DETECTED Final   Respiratory Syncytial Virus NOT DETECTED NOT DETECTED Final   Bordetella pertussis NOT DETECTED NOT DETECTED Final   Bordetella Parapertussis NOT DETECTED NOT DETECTED Final   Chlamydophila pneumoniae NOT DETECTED NOT DETECTED Final   Mycoplasma pneumoniae NOT DETECTED NOT DETECTED Final    Comment: Performed at [facility], [address] [place], [place] [address]  Culture, blood (single) w Reflex to ID Panel     Status: None   Collection Time: 09/16/22  7:14 PM   Specimen: BLOOD  Result Value Ref Range Status   Specimen Description   Final    BLOOD BLOOD RIGHT ARM Performed at [facility], [address], [place], [place] [address]    Special Requests   Final    BOTTLES DRAWN AEROBIC ONLY Blood Culture adequate volume Performed at [facility], [address], [place], [place] [address]    Culture   Final    NO GROWTH 5 DAYS Performed at [facility], [address], [place], [place] [address]    Report Status 09/21/2022 FINAL  Final  Culture, blood (single) w Reflex to ID Panel     Status: None   Collection Time: 09/16/22  7:18 PM   Specimen: BLOOD  Result Value Ref Range Status   Specimen Description   Final    BLOOD BLOOD LEFT HAND Performed at [facility], [address] [place], [place] [address]    Special Requests   Final    BOTTLES DRAWN AEROBIC ONLY Blood Culture adequate volume Performed at [facility], [address], [place], [place] [address]    Culture   Final    NO GROWTH 5 DAYS Performed at [facility], [address], [place], [place] [address]    Report Status 09/21/2022 FINAL  Final  Culture, blood (Routine X 2) w Reflex to ID Panel     Status: None (Preliminary result)   Collection Time: 09/18/22  3:33 PM   Specimen: BLOOD LEFT HAND  Result Value Ref Range Status   Specimen  Description   Final    BLOOD LEFT HAND Performed at [facility], [address] [place], [place] [address]    Special Requests   Final    AEROBIC BOTTLE ONLY Blood Culture adequate volume Performed at [facility], [address], [place], [place] [address]    Culture   Final    NO GROWTH 4 DAYS Performed at [facility], [address], [place], [place] [address]    Report Status PENDING  Incomplete  Culture, blood (Routine X 2) w Reflex to ID Panel     Status: None (Preliminary result)   Collection Time: 09/18/22  3:43 PM   Specimen: BLOOD LEFT HAND  Result Value Ref Range Status   Specimen Description   Final    BLOOD LEFT HAND Performed at [facility], [address], [place], [place] [address]    Special Requests   Final    AEROBIC BOTTLE ONLY Blood Culture adequate volume Performed at [facility], [address] [place], [place] [address]  Culture   Final    NO GROWTH 4 DAYS Performed at [facility], [address], [place], [place] [address]    Report Status PENDING  Incomplete         Radiology Studies: ECHO TEE  Result Date: 09/22/2022    TRANSESOPHOGEAL ECHO REPORT   Patient Name:   Kayla [name] Date of Exam: 09/22/2022 Medical Rec #:  [id number]       Height:       63.0 in Accession #:    [id number]      Weight:       108.0 lb Date of Birth:  [birth year]        BSA:          1.488 m Patient Age:    72 years        BP:           110/58 mmHg Patient Gender: F               HR:           93 bpm. Exam Location:  Inpatient Procedure: Transesophageal Echo, Cardiac Doppler and Color Doppler Indications:     Endocarditis  History:         Patient has prior history of Echocardiogram examinations, most                  recent 09/17/2022. COPD; Signs/Symptoms:Chest Pain.  Sonographer:     Darlys Gales Referring Phys:  [id number] Odette Fraction Diagnosing Phys: Dietrich Pates MD PROCEDURE:  After discussion of the risks and benefits of [place] TEE, an informed consent was obtained from the patient. The transesophogeal probe was passed without difficulty through the esophogus of the patient. Sedation performed by different physician. The patient was monitored while under deep sedation. Anesthestetic sedation was provided intravenously by Anesthesiology: 130mg  of Propofol, 50mg  of Lidocaine. The patient developed no complications during the procedure.  IMPRESSIONS  1. No vegetations seen.  2. The left ventricle has normal function.  3. Right ventricular systolic function is normal. The right ventricular size is normal.  4. No left atrial/left atrial appendage thrombus was detected.  5. The mitral valve is normal in structure. Trivial mitral valve regurgitation.  6. The aortic valve is tricuspid. Aortic valve regurgitation is not visualized.  7. Evidence of atrial level shunting detected by color flow Doppler. Agitated saline contrast bubble study was positive with shunting observed within 3-6 cardiac cycles suggestive of interatrial shunt. FINDINGS  Left Ventricle: The left ventricle has normal function. The left ventricular internal cavity size was normal in size. Right Ventricle: The right ventricular size is normal. Right ventricular systolic function is normal. Left Atrium: Left atrial size was normal in size. No left atrial/left atrial appendage thrombus was detected. Right Atrium: Right atrial size was normal in size. Pericardium: There is no evidence of pericardial effusion. Mitral Valve: The mitral valve is normal in structure. Trivial mitral valve regurgitation. Tricuspid Valve: The tricuspid valve is normal in structure. Tricuspid valve regurgitation is trivial. Aortic Valve: The aortic valve is tricuspid. Aortic valve regurgitation is not visualized. Pulmonic Valve: The pulmonic valve was normal in structure. Aorta: The aortic root and ascending aorta are structurally normal, with no evidence of  dilitation. IAS/Shunts: Evidence of atrial level shunting detected by color flow Doppler. Agitated saline contrast bubble study was positive with shunting observed within 3-6 cardiac cycles suggestive of interatrial shunt. Dietrich Pates MD Electronically signed by Dietrich Pates MD Signature Date/Time:  09/22/2022/9:10:01 PM    Final    EP STUDY  Result Date: 09/22/2022 See surgical note for result.       Scheduled Meds:  arformoterol  15 mcg Nebulization BID   budesonide (PULMICORT) nebulizer solution  0.25 mg Nebulization BID   Chlorhexidine Gluconate Cloth  6 each Topical Daily   enoxaparin (LOVENOX) injection  40 mg Subcutaneous Q24H   feeding supplement  237 mL Oral TID BM   FLUoxetine  40 mg Oral Daily   guaiFENesin  600 mg Oral BID   levothyroxine  100 mcg Oral Q0600   mirabegron ER  50 mg Oral Daily   mirtazapine  15 mg Oral QHS   multivitamin with minerals  1 tablet Oral Daily   pantoprazole  40 mg Oral Daily   revefenacin  175 mcg Nebulization Daily   tamsulosin  0.4 mg Oral Daily   temazepam  15 mg Oral QHS   Continuous Infusions:  sodium chloride 0 mL/hr at 09/20/22 0904   cefTRIAXone (ROCEPHIN)  IV 2 g (09/22/22 2102)     LOS: 7 days    Time spent: over 30 min    Lacretia Nicks, MD [facility]   To contact the attending provider between 7A-7P or the covering provider during after hours 7P-7A, please log into the web site [email or web address] and access using universal [place] password for that web site. If you do not have the password, please call the hospital operator.  09/23/2022, 2:06 PM

## 2022-09-23 NOTE — Progress Notes (Signed)
Subjective:  No new complaints  Antibiotics:  Anti-infectives (From admission, onward)    Start     Dose/Rate Route Frequency Ordered Stop   09/18/22 0400  vancomycin (VANCOCIN) IVPB 1000 mg/200 mL premix  Status:  Discontinued        1,000 mg 200 mL/hr over 60 Minutes Intravenous Every 36 hours 09/16/22 1809 09/19/22 1055   09/17/22 2200  cefTRIAXone (ROCEPHIN) 2 g in sodium chloride 0.9 % 100 mL IVPB        2 g 200 mL/hr over 30 Minutes Intravenous Every 24 hours 09/17/22 1047     09/17/22 0000  ceFEPIme (MAXIPIME) 2 g in sodium chloride 0.9 % 100 mL IVPB  Status:  Discontinued        2 g 200 mL/hr over 30 Minutes Intravenous Every 24 hours 09/16/22 1638 09/16/22 1639   09/17/22 0000  ceFEPIme (MAXIPIME) 500 mg in dextrose 5 % 50 mL IVPB  Status:  Discontinued        500 mg 110 mL/hr over 30 Minutes Intravenous Every 24 hours 09/16/22 1639 09/16/22 1800   09/16/22 2200  ceFEPIme (MAXIPIME) 2 g in sodium chloride 0.9 % 100 mL IVPB  Status:  Discontinued        2 g 200 mL/hr over 30 Minutes Intravenous Every 12 hours 09/16/22 1802 09/17/22 1047   09/16/22 1500  vancomycin (VANCOREADY) IVPB 750 mg/150 mL        750 mg 150 mL/hr over 60 Minutes Intravenous  Once 09/16/22 1447 09/16/22 1638   09/16/22 1500  ceFEPIme (MAXIPIME) 2 g in sodium chloride 0.9 % 100 mL IVPB        2 g 200 mL/hr over 30 Minutes Intravenous  Once 09/16/22 1451 09/16/22 1549   09/16/22 1445  ceFEPIme (MAXIPIME) 1 g in sodium chloride 0.9 % 100 mL IVPB  Status:  Discontinued        1 g 200 mL/hr over 30 Minutes Intravenous  Once 09/16/22 1438 09/16/22 1452       Medications: Scheduled Meds:  arformoterol  15 mcg Nebulization BID   budesonide (PULMICORT) nebulizer solution  0.25 mg Nebulization BID   Chlorhexidine Gluconate Cloth  6 each Topical Daily   COVID-19 mRNA vaccine 2023-2024  0.3 mL Intramuscular Once   enoxaparin (LOVENOX) injection  40 mg Subcutaneous Q24H   feeding supplement   237 mL Oral TID BM   FLUoxetine  40 mg Oral Daily   guaiFENesin  600 mg Oral BID   levothyroxine  100 mcg Oral Q0600   mirabegron ER  50 mg Oral Daily   mirtazapine  15 mg Oral QHS   multivitamin with minerals  1 tablet Oral Daily   pantoprazole  40 mg Oral Daily   revefenacin  175 mcg Nebulization Daily   tamsulosin  0.4 mg Oral Daily   temazepam  15 mg Oral QHS   Continuous Infusions:  sodium chloride 0 mL/hr at 09/20/22 0904   cefTRIAXone (ROCEPHIN)  IV 2 g (09/22/22 2102)   PRN Meds:.sodium chloride, acetaminophen, albuterol, ondansetron **OR** ondansetron (ZOFRAN) IV, mouth rinse, senna-docusate, sodium chloride HYPERTONIC    Objective: Weight change: -2.1 kg  Intake/Output Summary (Last 24 hours) at 09/23/2022 1925 Last data filed at 09/23/2022 1055 Gross per 24 hour  Intake 472 ml  Output 1900 ml  Net -1428 ml    Blood pressure (!) 124/54, pulse 87, temperature 98.5 F (36.9 C), temperature source Oral, resp. rate 18, height  5\' 3"  (1.6 m), weight 46.9 kg, SpO2 97 %. Temp:  [98.5 F (36.9 C)-99.1 F (37.3 C)] 98.5 F (36.9 C) (07/02 1313) Pulse Rate:  [75-87] 87 (07/02 1313) Resp:  [18-20] 18 (07/02 1313) BP: (106-124)/(52-54) 124/54 (07/02 1313) SpO2:  [96 %-98 %] 97 % (07/02 1313) Weight:  [46.9 kg] 46.9 kg (07/02 0454)  Physical Exam: Physical Exam Constitutional:      General: She is not in acute distress.    Appearance: She is well-developed and underweight. She is not diaphoretic.  HENT:     Head: Normocephalic and atraumatic.     Right Ear: External ear normal.     Left Ear: External ear normal.     Mouth/Throat:     Pharynx: No oropharyngeal exudate.  Eyes:     General: No scleral icterus.    Conjunctiva/sclera: Conjunctivae normal.     Pupils: Pupils are equal, round, and reactive to light.  Cardiovascular:     Rate and Rhythm: Normal rate and regular rhythm.  Pulmonary:     Effort: Pulmonary effort is normal. No respiratory distress.      Breath sounds: Normal breath sounds. No wheezing.  Abdominal:     General: Bowel sounds are normal. There is no distension.     Palpations: Abdomen is soft.     Tenderness: There is no abdominal tenderness. There is no rebound.  Musculoskeletal:        General: No tenderness. Normal range of motion.  Lymphadenopathy:     Cervical: No cervical adenopathy.  Skin:    General: Skin is warm and dry.     Coloration: Skin is not pale.     Findings: No erythema or rash.  Neurological:     General: No focal deficit present.     Mental Status: She is alert and oriented to person, place, and time.     Motor: No abnormal muscle tone.     Coordination: Coordination normal.  Psychiatric:        Mood and Affect: Mood normal.        Behavior: Behavior normal.        Thought Content: Thought content normal.        Judgment: Judgment normal.      CBC:    BMET Recent Labs    09/22/22 0435 09/23/22 0548  NA 138 133*  K 4.4 3.9  CL 95* 96*  CO2 31 28  GLUCOSE 101* 90  BUN 11 8  CREATININE 0.57 0.52  CALCIUM 9.4 9.0      Liver Panel  Recent Labs    09/22/22 0435 09/23/22 0548  PROT 5.1* 5.4*  ALBUMIN 2.3* 2.4*  AST 18 17  ALT 17 15  ALKPHOS 63 69  BILITOT 0.2* 0.3        Sedimentation Rate No results for input(s): "ESRSEDRATE" in the last 72 hours. C-Reactive Protein No results for input(s): "CRP" in the last 72 hours.  Micro Results: Recent Results (from the past 720 hour(s))  Blood culture (routine x 2)     Status: Abnormal   Collection Time: 08/31/22  5:03 PM   Specimen: BLOOD  Result Value Ref Range Status   Specimen Description   Final    BLOOD LEFT ANTECUBITAL Performed at [facility], [address] [place], [place] [address]    Special Requests   Final    BOTTLES DRAWN AEROBIC AND ANAEROBIC Blood Culture results may not be optimal due to an excessive volume of  blood received in culture bottles Performed at [facility], [address] [place], [place] [address]    Culture  Setup Time   Final    GRAM POSITIVE COCCI IN PAIRS AEROBIC BOTTLE ONLY CRITICAL RESULT CALLED TO, READ BACK BY AND VERIFIED WITH: Marita Snellen [phone] FCP Performed at [facility], [address], [place], [place] [address]    Culture STREPTOCOCCUS PNEUMONIAE (A)  Final   Report Status 09/03/2022 FINAL  Final   Organism ID, Bacteria STREPTOCOCCUS PNEUMONIAE  Final      Susceptibility   Streptococcus pneumoniae - MIC*    ERYTHROMYCIN >=8 RESISTANT Resistant     LEVOFLOXACIN 0.5 SENSITIVE Sensitive     VANCOMYCIN 0.5 SENSITIVE Sensitive     PENICILLIN (meningitis) 1 RESISTANT Resistant     PENO - penicillin 1      PENICILLIN (non-meningitis) 1 SENSITIVE Sensitive     PENICILLIN (oral) 1 INTERMEDIATE Intermediate     CEFTRIAXONE (non-meningitis) 1 SENSITIVE Sensitive     CEFTRIAXONE (meningitis) 1 INTERMEDIATE Intermediate     * STREPTOCOCCUS PNEUMONIAE  Blood Culture ID Panel (Reflexed)     Status: Abnormal   Collection Time: 08/31/22  5:03 PM  Result Value Ref Range Status   Enterococcus faecalis NOT DETECTED NOT DETECTED Final   Enterococcus Faecium NOT DETECTED NOT DETECTED Final   Listeria monocytogenes NOT DETECTED NOT DETECTED Final   Staphylococcus species NOT DETECTED NOT DETECTED Final   Staphylococcus aureus (BCID) NOT DETECTED NOT DETECTED Final   Staphylococcus epidermidis NOT DETECTED NOT DETECTED Final   Staphylococcus lugdunensis NOT DETECTED NOT DETECTED Final   Streptococcus species DETECTED (A) NOT DETECTED Final    Comment: CRITICAL RESULT CALLED TO, READ BACK BY AND VERIFIED WITH: PHARMD AUHN P. [phone] FCP    Streptococcus agalactiae NOT DETECTED NOT DETECTED Final   Streptococcus pneumoniae DETECTED (A) NOT DETECTED Final    Comment: CRITICAL RESULT CALLED TO, READ BACK BY AND VERIFIED WITH: PHARMD AUHN P. [phone] FCP    Streptococcus pyogenes NOT DETECTED NOT DETECTED  Final   A.calcoaceticus-baumannii NOT DETECTED NOT DETECTED Final   Bacteroides fragilis NOT DETECTED NOT DETECTED Final   Enterobacterales NOT DETECTED NOT DETECTED Final   Enterobacter cloacae complex NOT DETECTED NOT DETECTED Final   Escherichia coli NOT DETECTED NOT DETECTED Final   Klebsiella aerogenes NOT DETECTED NOT DETECTED Final   Klebsiella oxytoca NOT DETECTED NOT DETECTED Final   Klebsiella pneumoniae NOT DETECTED NOT DETECTED Final   Proteus species NOT DETECTED NOT DETECTED Final   Salmonella species NOT DETECTED NOT DETECTED Final   Serratia marcescens NOT DETECTED NOT DETECTED Final   Haemophilus influenzae NOT DETECTED NOT DETECTED Final   Neisseria meningitidis NOT DETECTED NOT DETECTED Final   Pseudomonas aeruginosa NOT DETECTED NOT DETECTED Final   Stenotrophomonas maltophilia NOT DETECTED NOT DETECTED Final   Candida albicans NOT DETECTED NOT DETECTED Final   Candida auris NOT DETECTED NOT DETECTED Final   Candida glabrata NOT DETECTED NOT DETECTED Final   Candida krusei NOT DETECTED NOT DETECTED Final   Candida parapsilosis NOT DETECTED NOT DETECTED Final   Candida tropicalis NOT DETECTED NOT DETECTED Final   Cryptococcus neoformans/gattii NOT DETECTED NOT DETECTED Final    Comment: Performed at [facility], [address] [place], [place] [address]  Blood culture (routine x 2)     Status: None   Collection Time: 08/31/22  5:10 PM   Specimen: BLOOD RIGHT HAND  Result Value Ref Range Status   Specimen Description   Final    BLOOD RIGHT HAND Performed at [facility], [address], [place], [place] [address]    Special Requests   Final    BOTTLES DRAWN AEROBIC AND ANAEROBIC Blood Culture adequate volume Performed at [facility], [address] [place], [place] [address]    Culture   Final    NO GROWTH 5 DAYS Performed at [facility], [address], [place], [place] [address]    Report Status 09/05/2022 FINAL   Final  Respiratory (~20 pathogens) panel by PCR     Status: Abnormal   Collection Time: 08/31/22  8:51 PM   Specimen: Nasopharyngeal Swab; Respiratory  Result Value Ref Range Status   Adenovirus NOT DETECTED NOT DETECTED Final   Coronavirus 229E NOT DETECTED NOT DETECTED Final    Comment: (NOTE) The Coronavirus on the Respiratory Panel, DOES NOT test for the novel  Coronavirus (2019 nCoV)    Coronavirus HKU1 NOT DETECTED NOT DETECTED Final   Coronavirus NL63 NOT DETECTED NOT DETECTED Final   Coronavirus OC43 NOT DETECTED NOT DETECTED Final   Metapneumovirus NOT DETECTED NOT DETECTED Final   Rhinovirus / Enterovirus DETECTED (A) NOT DETECTED Final   Influenza A NOT DETECTED NOT DETECTED Final   Influenza B NOT DETECTED NOT DETECTED Final   Parainfluenza Virus 1 NOT DETECTED NOT DETECTED Final   Parainfluenza Virus 2 NOT DETECTED NOT DETECTED Final   Parainfluenza Virus 3 NOT DETECTED NOT DETECTED Final   Parainfluenza Virus 4 NOT DETECTED NOT DETECTED Final   Respiratory Syncytial Virus NOT DETECTED NOT DETECTED Final   Bordetella pertussis NOT DETECTED NOT DETECTED Final   Bordetella Parapertussis NOT DETECTED NOT DETECTED Final   Chlamydophila pneumoniae NOT DETECTED NOT DETECTED Final   Mycoplasma pneumoniae NOT DETECTED NOT DETECTED Final    Comment: Performed at [facility], [address], [place], [place] [address]  SARS Coronavirus 2 by RT PCR (hospital order, performed in [facility] hospital lab) *cepheid single result test* Anterior Nasal Swab     Status: None   Collection Time: 08/31/22  8:51 PM   Specimen: Anterior Nasal Swab  Result Value Ref Range Status   SARS Coronavirus 2 by RT PCR NEGATIVE NEGATIVE Final    Comment: (NOTE) SARS-CoV-2 target nucleic acids are NOT DETECTED.  The SARS-CoV-2 RNA is generally detectable in upper and lower respiratory specimens during the acute phase of infection. The lowest concentration of SARS-CoV-2 viral copies this assay  can detect is 250 copies / mL. A negative result does not preclude SARS-CoV-2 infection and should not be used as the sole basis for treatment or other patient management decisions.  A negative result may occur with improper specimen collection / handling, submission of specimen other than nasopharyngeal swab, presence of viral mutation(s) within the areas targeted by this assay, and inadequate number of viral copies (<250 copies / mL). A negative result must be combined with clinical observations, patient history, and epidemiological information.  Fact Sheet for Patients:   RoadLapTop.co.za  Fact Sheet for Healthcare Providers: http://kim-miller.com/  This test is not yet approved or  cleared by the [place] FDA and has been authorized for detection and/or diagnosis of SARS-CoV-2 by FDA under an Emergency Use Authorization (EUA).  This EUA will remain in effect (meaning this test can be used) for the duration of the COVID-19 declaration under Section 564(b)(1) of the Act, 21 U.S.C.  section 360bbb-3(b)(1), unless the authorization is terminated or revoked sooner.  Performed at [facility], [address], [place], [place] [address]   MRSA Next Gen by PCR, Nasal     Status: None   Collection Time: 08/31/22  8:51 PM   Specimen: Nasal Mucosa; Nasal Swab  Result Value Ref Range Status   MRSA by PCR Next Gen NOT DETECTED NOT DETECTED Final    Comment: (NOTE) The GeneXpert MRSA Assay (FDA approved for NASAL specimens only), is one component of a comprehensive MRSA colonization surveillance program. It is not intended to diagnose MRSA infection nor to guide or monitor treatment for MRSA infections. Test performance is not FDA approved in patients less than 51 years old. Performed at [facility], [address], [place], [place] [address]   Urine Culture     Status: Abnormal   Collection Time:  08/31/22  8:51 PM   Specimen: Urine, Random  Result Value Ref Range Status   Specimen Description   Final    URINE, RANDOM Performed at [facility], [address] [place], [place] [address]    Special Requests   Final    NONE Reflexed from [id number] Performed at [facility], [address], [place], [place] [address]    Culture 20,000 COLONIES/mL PSEUDOMONAS AERUGINOSA (A)  Final   Report Status 09/03/2022 FINAL  Final   Organism ID, Bacteria PSEUDOMONAS AERUGINOSA (A)  Final      Susceptibility   Pseudomonas aeruginosa - MIC*    CEFTAZIDIME 16 INTERMEDIATE Intermediate     CIPROFLOXACIN 1 INTERMEDIATE Intermediate     GENTAMICIN <=1 SENSITIVE Sensitive     IMIPENEM 2 SENSITIVE Sensitive     * 20,000 COLONIES/mL PSEUDOMONAS AERUGINOSA  Culture, blood (single)     Status: Abnormal   Collection Time: 09/16/22  2:08 PM   Specimen: BLOOD RIGHT FOREARM  Result Value Ref Range Status   Specimen Description   Final    BLOOD RIGHT FOREARM Performed at [facility], [address], [place], [place] [address]    Special Requests   Final    BOTTLES DRAWN AEROBIC AND ANAEROBIC Blood Culture adequate volume Performed at [facility], [address] [place], [place] [address]    Culture  Setup Time   Final    GRAM POSITIVE COCCI IN BOTH AEROBIC AND ANAEROBIC BOTTLES CRITICAL RESULT CALLED TO, READ BACK BY AND VERIFIED WITH: Thana Ates [phone] FCP Performed at [facility], [address] [place], [place] [address]    Culture STREPTOCOCCUS PNEUMONIAE (A)  Final   Report Status 09/19/2022 FINAL  Final   Organism ID, Bacteria STREPTOCOCCUS PNEUMONIAE  Final      Susceptibility   Streptococcus pneumoniae - MIC*    ERYTHROMYCIN >=8 RESISTANT Resistant     LEVOFLOXACIN 0.5 SENSITIVE Sensitive     VANCOMYCIN 0.5 SENSITIVE Sensitive     PENICILLIN (meningitis) 1 RESISTANT Resistant     PENO -  penicillin 1      PENICILLIN (non-meningitis) 1 SENSITIVE Sensitive     PENICILLIN (oral) 1 INTERMEDIATE Intermediate     CEFTRIAXONE (non-meningitis) 0.5 SENSITIVE Sensitive     CEFTRIAXONE (meningitis) 0.5 SENSITIVE Sensitive     * STREPTOCOCCUS PNEUMONIAE  Blood Culture ID Panel (Reflexed)     Status: Abnormal   Collection Time: 09/16/22  2:08 PM  Result Value Ref Range Status   Enterococcus faecalis NOT DETECTED NOT DETECTED  Final   Enterococcus Faecium NOT DETECTED NOT DETECTED Final   Listeria monocytogenes NOT DETECTED NOT DETECTED Final   Staphylococcus species NOT DETECTED NOT DETECTED Final   Staphylococcus aureus (BCID) NOT DETECTED NOT DETECTED Final   Staphylococcus epidermidis NOT DETECTED NOT DETECTED Final   Staphylococcus lugdunensis NOT DETECTED NOT DETECTED Final   Streptococcus species DETECTED (A) NOT DETECTED Final    Comment: CRITICAL RESULT CALLED TO, READ BACK BY AND VERIFIED WITH: PHARMD J. GADHIA [phone] FCP    Streptococcus agalactiae NOT DETECTED NOT DETECTED Final   Streptococcus pneumoniae DETECTED (A) NOT DETECTED Final    Comment: CRITICAL RESULT CALLED TO, READ BACK BY AND VERIFIED WITH: PHARMD J. Teodoro Kil [phone] FCP    Streptococcus pyogenes NOT DETECTED NOT DETECTED Final   A.calcoaceticus-baumannii NOT DETECTED NOT DETECTED Final   Bacteroides fragilis NOT DETECTED NOT DETECTED Final   Enterobacterales NOT DETECTED NOT DETECTED Final   Enterobacter cloacae complex NOT DETECTED NOT DETECTED Final   Escherichia coli NOT DETECTED NOT DETECTED Final   Klebsiella aerogenes NOT DETECTED NOT DETECTED Final   Klebsiella oxytoca NOT DETECTED NOT DETECTED Final   Klebsiella pneumoniae NOT DETECTED NOT DETECTED Final   Proteus species NOT DETECTED NOT DETECTED Final   Salmonella species NOT DETECTED NOT DETECTED Final   Serratia marcescens NOT DETECTED NOT DETECTED Final   Haemophilus influenzae NOT DETECTED NOT DETECTED Final   Neisseria  meningitidis NOT DETECTED NOT DETECTED Final   Pseudomonas aeruginosa NOT DETECTED NOT DETECTED Final   Stenotrophomonas maltophilia NOT DETECTED NOT DETECTED Final   Candida albicans NOT DETECTED NOT DETECTED Final   Candida auris NOT DETECTED NOT DETECTED Final   Candida glabrata NOT DETECTED NOT DETECTED Final   Candida krusei NOT DETECTED NOT DETECTED Final   Candida parapsilosis NOT DETECTED NOT DETECTED Final   Candida tropicalis NOT DETECTED NOT DETECTED Final   Cryptococcus neoformans/gattii NOT DETECTED NOT DETECTED Final    Comment: Performed at [facility], [address] [place], [place] [address]  MRSA Next Gen by PCR, Nasal     Status: None   Collection Time: 09/16/22  5:38 PM   Specimen: Nasal Mucosa; Nasal Swab  Result Value Ref Range Status   MRSA by PCR Next Gen NOT DETECTED NOT DETECTED Final    Comment: (NOTE) The GeneXpert MRSA Assay (FDA approved for NASAL specimens only), is one component of a comprehensive MRSA colonization surveillance program. It is not intended to diagnose MRSA infection nor to guide or monitor treatment for MRSA infections. Test performance is not FDA approved in patients less than 36 years old. Performed at [facility], [address], [place], [place] [address]   Respiratory (~20 pathogens) panel by PCR     Status: Abnormal   Collection Time: 09/16/22  6:47 PM   Specimen: Nasopharyngeal Swab; Respiratory  Result Value Ref Range Status   Adenovirus NOT DETECTED NOT DETECTED Final   Coronavirus 229E NOT DETECTED NOT DETECTED Final    Comment: (NOTE) The Coronavirus on the Respiratory Panel, DOES NOT test for the novel  Coronavirus (2019 nCoV)    Coronavirus HKU1 NOT DETECTED NOT DETECTED Final   Coronavirus NL63 NOT DETECTED NOT DETECTED Final   Coronavirus OC43 NOT DETECTED NOT DETECTED Final   Metapneumovirus NOT DETECTED NOT DETECTED Final   Rhinovirus / Enterovirus DETECTED (A) NOT DETECTED Final    Influenza A NOT DETECTED NOT DETECTED Final   Influenza B NOT DETECTED NOT DETECTED Final  Parainfluenza Virus 1 NOT DETECTED NOT DETECTED Final   Parainfluenza Virus 2 NOT DETECTED NOT DETECTED Final   Parainfluenza Virus 3 NOT DETECTED NOT DETECTED Final   Parainfluenza Virus 4 NOT DETECTED NOT DETECTED Final   Respiratory Syncytial Virus NOT DETECTED NOT DETECTED Final   Bordetella pertussis NOT DETECTED NOT DETECTED Final   Bordetella Parapertussis NOT DETECTED NOT DETECTED Final   Chlamydophila pneumoniae NOT DETECTED NOT DETECTED Final   Mycoplasma pneumoniae NOT DETECTED NOT DETECTED Final    Comment: Performed at [facility], [address], [place], [place] [address]  Culture, blood (single) w Reflex to ID Panel     Status: None   Collection Time: 09/16/22  7:14 PM   Specimen: BLOOD  Result Value Ref Range Status   Specimen Description   Final    BLOOD BLOOD RIGHT ARM Performed at [facility], [address] [place], [place] [address]    Special Requests   Final    BOTTLES DRAWN AEROBIC ONLY Blood Culture adequate volume Performed at [facility], [address], [place], [place] [address]    Culture   Final    NO GROWTH 5 DAYS Performed at [facility], [address], [place], [place] [address]    Report Status 09/21/2022 FINAL  Final  Culture, blood (single) w Reflex to ID Panel     Status: None   Collection Time: 09/16/22  7:18 PM   Specimen: BLOOD  Result Value Ref Range Status   Specimen Description   Final    BLOOD BLOOD LEFT HAND Performed at [facility], [address] [place], [place] [address]    Special Requests   Final    BOTTLES DRAWN AEROBIC ONLY Blood Culture adequate volume Performed at [facility], [address] [place], [place] [address]    Culture   Final    NO GROWTH 5 DAYS Performed at [facility], [address], [place], [place]  [address]    Report Status 09/21/2022 FINAL  Final  Culture, blood (Routine X 2) w Reflex to ID Panel     Status: None   Collection Time: 09/18/22  3:33 PM   Specimen: BLOOD LEFT HAND  Result Value Ref Range Status   Specimen Description   Final    BLOOD LEFT HAND Performed at [facility], [address] [place], [place] [address]    Special Requests   Final    AEROBIC BOTTLE ONLY Blood Culture adequate volume Performed at [facility], [address] [place], [place] [address]    Culture   Final    NO GROWTH 5 DAYS Performed at [facility], [address], [place], [place] [address]    Report Status 09/23/2022 FINAL  Final  Culture, blood (Routine X 2) w Reflex to ID Panel     Status: None   Collection Time: 09/18/22  3:43 PM   Specimen: BLOOD LEFT HAND  Result Value Ref Range Status   Specimen Description   Final    BLOOD LEFT HAND Performed at [facility], [address], [place], [place] [address]    Special Requests   Final    AEROBIC BOTTLE ONLY Blood Culture adequate volume Performed at [facility], [address], [place], [place] [address]    Culture   Final    NO GROWTH 5 DAYS Performed at [facility], [address], [place], [place] [address]    Report Status 09/23/2022 FINAL  Final    Studies/Results: ECHO TEE  Result Date: 09/22/2022    TRANSESOPHOGEAL ECHO REPORT   Patient Name:   SHVONNE MARCELINO Date of Exam: 09/22/2022 Medical Rec #:  [id number]       Height:       63.0 in Accession #:    [id number]      Weight:       108.0 lb Date of Birth:  01-23-1951        BSA:          1.488 m Patient Age:    72 years        BP:           110/58 mmHg Patient Gender: F               HR:           93 bpm. Exam Location:  Inpatient Procedure: Transesophageal Echo, Cardiac Doppler and Color Doppler Indications:     Endocarditis  History:         Patient has prior history of Echocardiogram  examinations, most                  recent 09/17/2022. COPD; Signs/Symptoms:Chest Pain.  Sonographer:     Darlys Gales Referring Phys:  [id number] Odette Fraction Diagnosing Phys: Dietrich Pates MD PROCEDURE: After discussion of the risks and benefits of a TEE, an informed consent was obtained from the patient. The transesophogeal probe was passed without difficulty through the esophogus of the patient. Sedation performed by different physician. The patient was monitored while under deep sedation. Anesthestetic sedation was provided intravenously by Anesthesiology: 130mg  of Propofol, 50mg  of Lidocaine. The patient developed no complications during the procedure.  IMPRESSIONS  1. No vegetations seen.  2. The left ventricle has normal function.  3. Right ventricular systolic function is normal. The right ventricular size is normal.  4. No left atrial/left atrial appendage thrombus was detected.  5. The mitral valve is normal in structure. Trivial mitral valve regurgitation.  6. The aortic valve is tricuspid. Aortic valve regurgitation is not visualized.  7. Evidence of atrial level shunting detected by color flow Doppler. Agitated saline contrast bubble study was positive with shunting observed within 3-6 cardiac cycles suggestive of interatrial shunt. FINDINGS  Left Ventricle: The left ventricle has normal function. The left ventricular internal cavity size was normal in size. Right Ventricle: The right ventricular size is normal. Right ventricular systolic function is normal. Left Atrium: Left atrial size was normal in size. No left atrial/left atrial appendage thrombus was detected. Right Atrium: Right atrial size was normal in size. Pericardium: There is no evidence of pericardial effusion. Mitral Valve: The mitral valve is normal in structure. Trivial mitral valve regurgitation. Tricuspid Valve: The tricuspid valve is normal in structure. Tricuspid valve regurgitation is trivial. Aortic Valve: The aortic valve is  tricuspid. Aortic valve regurgitation is not visualized. Pulmonic Valve: The pulmonic valve was normal in structure. Aorta: The aortic root and ascending aorta are structurally normal, with no evidence of dilitation. IAS/Shunts: Evidence of atrial level shunting detected by color flow Doppler. Agitated saline contrast bubble study was positive with shunting observed within 3-6 cardiac cycles suggestive of interatrial shunt. Dietrich Pates MD Electronically signed by Dietrich Pates MD Signature Date/Time: 09/22/2022/9:10:01 PM    Final    EP STUDY  Result Date: 09/22/2022 See surgical note for result.  Assessment/Plan:  INTERVAL HISTORY: TEE negative for vegetations  Principal Problem:   Pneumococcal bacteremia Active Problems:   Acute on chronic respiratory failure with hypoxia (HCC)   HCAP (healthcare-associated pneumonia)   Hypogammaglobulinemia (HCC)   Recurrent infections    TAISIYA RYDMAN is a 72 y.o. female with  hx of MS, agammaglobulinemia previously on immunoglobulin replacement (via subcutaneous route) admitted with current pneumonia and bacteremia due to pneumococcus  #1 Recurrent coccal bacteremia:  Fortunately she does not have endocarditis.  Will continue ceftriaxone in house and make sure she gets a dose tomorrow which is the day I anticipate she will be discharged.  We can then place her on high-dose amoxicillin 1 g orally 3 times daily to complete a total of 14 days of treatment.  It would be prudent for her to have some amoxicillin on hand at home in case she experiences a recurrence.  2.  Hypogammaglobulinemia:  Georgina Pillion has reviewed this case with me and there are issues with regards to transition to subcutaneous immunoglobulin therapy from IV[facility] and the patient and her husband and [place] agree that it be best for her to follow-up with Dr. Lucie Leather for resumption of her subcutaneous immunoglobulin which she takes weekly.  I have personally spent 52 minutes involved  in face-to-face and non-face-to-face activities for this patient on the day of the visit. Professional time spent includes the following activities: Preparing to see the patient (review of tests), Obtaining and/or reviewing separately obtained history (admission/discharge record), Performing a medically appropriate examination and/or evaluation , Ordering medications/tests/procedures, referring and communicating with other health care professionals, Documenting clinical information in the EMR, Independently interpreting results (not separately reported), Communicating results to the patient/family/caregiver, Counseling and educating the patient/family/caregiver and Care coordination (not separately reported).   I will sign off for now please call with further questions.    LOS: 7 days   Acey Lav 09/23/2022, 7:25 PM

## 2022-09-24 ENCOUNTER — Other Ambulatory Visit (HOSPITAL_COMMUNITY): Payer: Self-pay

## 2022-09-24 LAB — COMPREHENSIVE METABOLIC PANEL
ALT: 16 U/L (ref 0–44)
AST: 16 U/L (ref 15–41)
Albumin: 2.5 g/dL — ABNORMAL LOW (ref 3.5–5.0)
Alkaline Phosphatase: 64 U/L (ref 38–126)
Anion gap: 9 (ref 5–15)
BUN: 10 mg/dL (ref 8–23)
CO2: 29 mmol/L (ref 22–32)
Calcium: 8.8 mg/dL — ABNORMAL LOW (ref 8.9–10.3)
Chloride: 98 mmol/L (ref 98–111)
Creatinine, Ser: 0.58 mg/dL (ref 0.44–1.00)
GFR, Estimated: >60 mL/min (ref >60)
Glucose, Bld: 98 mg/dL (ref 70–99)
Potassium: 4 mmol/L (ref 3.5–5.1)
Sodium: 136 mmol/L (ref 135–145)
Total Bilirubin: 0.3 mg/dL (ref 0.3–1.2)
Total Protein: 5.3 g/dL — ABNORMAL LOW (ref 6.5–8.1)

## 2022-09-24 LAB — PHOSPHORUS: Phosphorus: 3.1 mg/dL (ref 2.5–4.6)

## 2022-09-24 LAB — COMPLEMENT, TOTAL: Compl, Total (CH50): >60 U/mL (ref >41)

## 2022-09-24 LAB — CBC WITH DIFFERENTIAL/PLATELET
Abs Immature Granulocytes: 0.12 10*3/uL — ABNORMAL HIGH (ref 0.00–0.07)
Basophils Absolute: 0.1 10*3/uL (ref 0.0–0.1)
Basophils Relative: 1 %
Eosinophils Absolute: 0.3 10*3/uL (ref 0.0–0.5)
Eosinophils Relative: 3 %
HCT: 29.1 % — ABNORMAL LOW (ref 36.0–46.0)
Hemoglobin: 8.8 g/dL — ABNORMAL LOW (ref 12.0–15.0)
Immature Granulocytes: 1 %
Lymphocytes Relative: 25 %
Lymphs Abs: 2.1 10*3/uL (ref 0.7–4.0)
MCH: 28.2 pg (ref 26.0–34.0)
MCHC: 30.2 g/dL (ref 30.0–36.0)
MCV: 93.3 fL (ref 80.0–100.0)
Monocytes Absolute: 1.1 10*3/uL — ABNORMAL HIGH (ref 0.1–1.0)
Monocytes Relative: 13 %
Neutro Abs: 4.7 10*3/uL (ref 1.7–7.7)
Neutrophils Relative %: 57 %
Platelets: 619 10*3/uL — ABNORMAL HIGH (ref 150–400)
RBC: 3.12 MIL/uL — ABNORMAL LOW (ref 3.87–5.11)
RDW: 15.6 % — ABNORMAL HIGH (ref 11.5–15.5)
WBC: 8.3 10*3/uL (ref 4.0–10.5)
nRBC: 0 % (ref 0.0–0.2)

## 2022-09-24 LAB — MAGNESIUM: Magnesium: 2 mg/dL (ref 1.7–2.4)

## 2022-09-24 MED ORDER — ALBUTEROL SULFATE (2.5 MG/3ML) 0.083% IN NEBU
2.5000 mg | INHALATION_SOLUTION | RESPIRATORY_TRACT | 2 refills | Status: DC | PRN
  Filled 2022-09-24: qty 90, 5d supply, fill #0
  Filled 2022-10-16: qty 90, 5d supply, fill #1
  Filled 2022-11-16: qty 90, 5d supply, fill #2

## 2022-09-24 MED ORDER — ALBUTEROL SULFATE (2.5 MG/3ML) 0.083% IN NEBU: 2.5000 mg | INHALATION_SOLUTION | RESPIRATORY_TRACT | 2 refills | Status: DC | PRN

## 2022-09-24 MED ORDER — AMOXICILLIN 500 MG PO CAPS
1000.0000 mg | ORAL_CAPSULE | Freq: Three times a day (TID) | ORAL | 0 refills | Status: AC
  Filled 2022-09-24: qty 36, 6d supply, fill #0

## 2022-09-24 NOTE — Plan of Care (Signed)
Pt alert and oriented x 4. Up with assist with walker to bsc. Pt takes meds whole. Rocephin given this shift. Oxygen at 2 L/[place] pt has scheduled nebs. No prn meds given. Pt has productive cough. 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 Covid vaccine given.  Problem: Fluid Volume: Goal: Hemodynamic stability will improve Outcome: Progressing   Problem: Clinical Measurements: Goal: Diagnostic test results will improve Outcome: Progressing Goal: Signs and symptoms of infection will decrease Outcome: Progressing   Problem: Respiratory: Goal: Ability to maintain adequate ventilation will improve Outcome: Progressing   Problem: Education: Goal: Knowledge of General Education information will improve Description: Including pain rating scale, medication(s)/side effects and non-pharmacologic comfort measures Outcome: Progressing   Problem: Health Behavior/Discharge Planning: Goal: Ability to manage health-related needs will improve Outcome: Progressing   Problem: Clinical Measurements: Goal: Ability to maintain clinical measurements within normal limits will improve Outcome: Progressing Goal: Will remain free from infection Outcome: Progressing Goal: Diagnostic test results will improve Outcome: Progressing Goal: Respiratory complications will improve Outcome: Progressing Goal: Cardiovascular complication will be avoided Outcome: Progressing   Problem: Activity: Goal: Risk for activity intolerance will decrease Outcome: Progressing   Problem: Nutrition: Goal: Adequate nutrition will be maintained Outcome: Progressing   Problem: Coping: Goal: Level of anxiety will decrease Outcome: Progressing   Problem: Elimination: Goal: Will not experience complications related to bowel motility Outcome: Progressing Goal: Will not experience complications related to urinary retention Outcome: Progressing   Problem: Pain Managment: Goal: General  experience of comfort will improve Outcome: Progressing   Problem: Safety: Goal: Ability to remain free from injury will improve Outcome: Progressing   Problem: Skin Integrity: Goal: Risk for impaired skin integrity will decrease Outcome: Progressing

## 2022-09-24 NOTE — Discharge Summary (Signed)
Physician Discharge Summary  Kayla [name] NWG:[id number] DOB: [birth year] DOA: 09/16/2022  PCP: [name], MD  Admit date: 09/16/2022 Discharge date: 09/24/2022  Admitted From: Home Disposition: Home  Recommendations for Outpatient Follow-up:  Follow up with PCP in 1-2 weeks Continue amoxicillin to complete antibiotic course for Streptococcus pneumonia bacteremia/pneumonia  Home Health: No Equipment/Devices: None  Discharge Condition: Stable CODE STATUS: Full code Diet recommendation: Regular diet  History of present illness:  Kayla [name] is a 72 y.o. female with medical history significant for multiple sclerosis, bilateral vocal cord immobility, immunodeficiency on IVIG, hypothyroidism, GERD, bronchiectasis, asthma, chronic cough recently admitted to [facility] from 6/9 to 6/13 for aspiration pneumonia, right lower lobe multifocal mucous plugging, rhinovirus positive and also found to have strep pneumo bacteremia.  She was treated in the hospital, weaned to room air, and discharged home to complete 10 more days of p.o. Ceftin.  Patient states she did not really improve after leaving the hospital.  She was having some side effects, so cefdinir was stopped on day 8 by her PCP.  She went to her outpatient pulmonology follow-up today, was noted to have sats of 84%, EMS was called, given her bronchodilator and brought to the emergency department.   Hospital course:  Acute hypoxic respiratory failure  Community Acquired Pneumonia Recurrent vs Nonresolving Pneumonia Rhinovirus Infection  Patient presenting to the ED after being found hypoxic during her outpatient pulmonology visit with SpO2 84%.  Repeat respiratory viral panel with continued positivity for rhinovirus.  Chest x-ray with bilateral lower lobe hazy lung opacities, right worse than left suggestive of continued pneumonia.  Blood cultures positive for Streptococcus pneumonia.  Pulmonology and infectious disease followed  during hospital course.  Initially started on empiric antibiotics with vancomycin and cefepime, de-escalated to ceftriaxone.  Weaned from supplemental oxygen, no desaturations lower than 92% on ambulation.  Infectious disease recommended transition to amoxicillin on discharge to complete a 14-day total course.    Streptococcus pneumoniae Bacteremia  Recent strep pneumo bacteremia, concerning for recurrent or inadequately treated bacteremia. Underwent TEE on 09/22/2022 with no vegetations identified.   Initially started on vancomycin and cefepime, de-escalated to ceftriaxone and will discharge on amoxicillin per infectious disease recommendations.   Small PFO Noted on transesophageal echocardiogram. Follow outpatient   Hyponatremia-resolved   Depression Prozac, remeron   GERD Continue PPI.   Hypothyroidism Continue levothyroxine 100 mcg p.o. daily   History of immune deficiency  Followed by Dr. Livingston Diones with Allergy and Immunology in [place], [place].  has been off of IVIG since hospitalization for pubic ramus fracture in March.   Has appointment with local provider coming up   Anemia Iron (wnl), folate (wnl), b12 (elevated), ferritin (wnl); suggestive of chronic disease   Insomnia Restoril, remeron at bedtime   Overactive Bladder Myrbetriq   Urinary Retention Self cath's at home.  Continue tamsulosin.   Multiple Sclerosis Followed by neurologist in [place], Dr. Konrad Felix    Bilateral Vocal Cord Immobility with Small Transglottal Airway Followed by Dr. Harriette Ohara with ENT at [facility]  Discharge Diagnoses:  Principal Problem:   Pneumococcal bacteremia Active Problems:   Acute on chronic respiratory failure with hypoxia (HCC)   HCAP (healthcare-associated pneumonia)   Hypogammaglobulinemia (HCC)   Recurrent infections    Discharge Instructions  Discharge Instructions     Call MD for:  difficulty breathing, headache or visual  disturbances   Complete by: As directed    Call MD for:  extreme fatigue  Complete by: As directed    Call MD for:  persistant dizziness or light-headedness   Complete by: As directed    Call MD for:  persistant nausea and vomiting   Complete by: As directed    Call MD for:  severe uncontrolled pain   Complete by: As directed    Call MD for:  temperature >100.4   Complete by: As directed    Diet - low sodium heart healthy   Complete by: As directed    Increase activity slowly   Complete by: As directed       Allergies as of 09/24/2022       Reactions   Tramadol    hallucinations    Contrast Media [iodinated Contrast Media] Rash   Iohexol Hives   Morphine And Codeine Other (See Comments)   Reaction:  Hallucinations    Methylprednisolone Other (See Comments)   Reaction:  Decreases pts heart rate    Hydrocodone Itching   Oxycodone-acetaminophen Rash, Other (See Comments)   Reaction:  Hallucinations    Phenytoin Sodium Extended Rash   Zanaflex [tizanidine Hydrochloride] Rash        Medication List     STOP taking these medications    dronabinol 2.5 MG capsule Commonly known as: Marinol       TAKE these medications    ACCRUFeR 30 MG Caps Generic drug: Ferric Maltol Take 1 capsule (30 mg total) by mouth in the morning and at bedtime.   albuterol 108 (90 Base) MCG/ACT inhaler Commonly known as: Ventolin HFA INHALE 2 PUFFS INTO LUNGS EVERY 6 HOURS AS NEEDED. What changed:  how much to take how to take this when to take this reasons to take this additional instructions   amoxicillin 500 MG capsule Commonly known as: AMOXIL Take 2 capsules (1,000 mg total) by mouth 3 (three) times daily for 6 days. Start taking on: September 25, 2022   calcium carbonate 1250 (500 Ca) MG tablet Commonly known as: OS-CAL - dosed in mg of elemental calcium Take 1 tablet (1,250 mg total) by mouth daily with breakfast.   dexlansoprazole 60 MG capsule Commonly known as:  Dexilant TAKE 1 CAPSULE BY MOUTH  ONCE DAILY WITH BREAKFAST What changed:  how much to take how to take this when to take this   docusate sodium 100 MG capsule Commonly known as: COLACE Take 1 capsule (100 mg total) by mouth in the morning and at bedtime.   famotidine 20 MG tablet Commonly known as: Pepcid One after supper What changed:  how much to take how to take this when to take this   FLUoxetine 40 MG capsule Commonly known as: PROZAC Take 1 capsule (40 mg total) by mouth daily.   Flutter Devi Use as directed   guaiFENesin 600 MG 12 hr tablet Commonly known as: MUCINEX Take 1 tablet (600 mg total) by mouth 2 (two) times daily. What changed:  when to take this reasons to take this   levothyroxine 100 MCG tablet Commonly known as: SYNTHROID TAKE 1 TABLET BY MOUTH ONCE  DAILY BEFORE BREAKFAST   methenamine 1 g tablet Commonly known as: HIPREX Take 1 tablet (1 g total) by mouth 2 (two) times daily with a meal.   mirtazapine 15 MG tablet Commonly known as: REMERON Take 1 tablet (15 mg total) by mouth at bedtime.   multivitamin with minerals Tabs tablet Take 1 tablet by mouth in the morning.   Myrbetriq 50 MG Tb24 tablet Generic drug: mirabegron ER  Take 50 mg by mouth daily. What changed: Another medication with the same name was removed. Continue taking this medication, and follow the directions you see here.   PROBIOTIC PO Take 1 capsule by mouth daily.   sodium chloride HYPERTONIC 3 % nebulizer solution Take by nebulization 2 (two) times daily as needed for cough. Dx J44.9 What changed: how much to take   tamsulosin 0.4 MG Caps capsule Commonly known as: FLOMAX Take 0.4 mg by mouth daily.   temazepam 15 MG capsule Commonly known as: RESTORIL Take 1 capsule (15 mg total) by mouth at bedtime.   torsemide 10 MG tablet Commonly known as: DEMADEX Take 1 tablet (10 mg total) by mouth daily.   Trelegy Ellipta 100-62.5-25 MCG/ACT Aepb Generic drug:  Fluticasone-Umeclidin-Vilant Inhale 1 puff into the lungs daily.   trimethoprim 100 MG tablet Commonly known as: TRIMPEX Take 100 mg by mouth daily.   Vitaline CoQ10 300 MG Wafr Generic drug: Coenzyme Q10 Take 300 mg by mouth in the morning.        Follow-up Information     [name], MD. Schedule an appointment as soon as possible for a visit in 1 week(s).   Specialty: Internal Medicine Contact information: [address] [place] [address] [phone]                Allergies  Allergen Reactions   Tramadol     hallucinations    Contrast Media [Iodinated Contrast Media] Rash   Iohexol Hives   Morphine And Codeine Other (See Comments)    Reaction:  Hallucinations    Methylprednisolone Other (See Comments)    Reaction:  Decreases pts heart rate    Hydrocodone Itching   Oxycodone-Acetaminophen Rash and Other (See Comments)    Reaction:  Hallucinations    Phenytoin Sodium Extended Rash   Zanaflex [Tizanidine Hydrochloride] Rash    Consultations: Cardiology Pulmonology Infectious disease   Procedures/Studies: ECHO TEE  Result Date: 09/22/2022    TRANSESOPHOGEAL ECHO REPORT   Patient Name:   CALLIANNE DENISTON Date of Exam: 09/22/2022 Medical Rec #:  [id number]       Height:       63.0 in Accession #:    [id number]      Weight:       108.0 lb Date of Birth:  1950/04/08        BSA:          1.488 m Patient Age:    72 years        BP:           110/58 mmHg Patient Gender: F               HR:           93 bpm. Exam Location:  Inpatient Procedure: Transesophageal Echo, Cardiac Doppler and Color Doppler Indications:     Endocarditis  History:         Patient has prior history of Echocardiogram examinations, most                  recent 09/17/2022. COPD; Signs/Symptoms:Chest Pain.  Sonographer:     Darlys Gales Referring Phys:  [id number] Odette Fraction Diagnosing Phys: Dietrich Pates MD PROCEDURE: After discussion of the risks and benefits of a TEE, an informed consent  was obtained from the patient. The transesophogeal probe was passed without difficulty through the esophogus of the patient. Sedation performed by different physician. The patient was monitored while under deep  sedation. Anesthestetic sedation was provided intravenously by Anesthesiology: 130mg  of Propofol, 50mg  of Lidocaine. The patient developed no complications during the procedure.  IMPRESSIONS  1. No vegetations seen.  2. The left ventricle has normal function.  3. Right ventricular systolic function is normal. The right ventricular size is normal.  4. No left atrial/left atrial appendage thrombus was detected.  5. The mitral valve is normal in structure. Trivial mitral valve regurgitation.  6. The aortic valve is tricuspid. Aortic valve regurgitation is not visualized.  7. Evidence of atrial level shunting detected by color flow Doppler. Agitated saline contrast bubble study was positive with shunting observed within 3-6 cardiac cycles suggestive of interatrial shunt. FINDINGS  Left Ventricle: The left ventricle has normal function. The left ventricular internal cavity size was normal in size. Right Ventricle: The right ventricular size is normal. Right ventricular systolic function is normal. Left Atrium: Left atrial size was normal in size. No left atrial/left atrial appendage thrombus was detected. Right Atrium: Right atrial size was normal in size. Pericardium: There is no evidence of pericardial effusion. Mitral Valve: The mitral valve is normal in structure. Trivial mitral valve regurgitation. Tricuspid Valve: The tricuspid valve is normal in structure. Tricuspid valve regurgitation is trivial. Aortic Valve: The aortic valve is tricuspid. Aortic valve regurgitation is not visualized. Pulmonic Valve: The pulmonic valve was normal in structure. Aorta: The aortic root and ascending aorta are structurally normal, with no evidence of dilitation. IAS/Shunts: Evidence of atrial level shunting detected by color  flow Doppler. Agitated saline contrast bubble study was positive with shunting observed within 3-6 cardiac cycles suggestive of interatrial shunt. Dietrich Pates MD Electronically signed by Dietrich Pates MD Signature Date/Time: 09/22/2022/9:10:01 PM    Final    EP STUDY  Result Date: 09/22/2022 See surgical note for result.  ECHOCARDIOGRAM COMPLETE  Result Date: 09/17/2022    ECHOCARDIOGRAM REPORT   Patient Name:   KHAMIL PICKETT Date of Exam: 09/17/2022 Medical Rec #:  [id number]       Height:       63.0 in Accession #:    [id number]      Weight:       98.8 lb Date of Birth:  [birth year]        BSA:          1.433 m Patient Age:    72 years        BP:           97/78 mmHg Patient Gender: F               HR:           73 bpm. Exam Location:  Inpatient Procedure: 2D Echo, Cardiac Doppler and Color Doppler Indications:    Bacteremia  History:        Patient has prior history of Echocardiogram examinations, most                 recent 01/28/2022. COPD and MS, hx cancer,                 Signs/Symptoms:Shortness of Breath and Bacteremia; Risk                 Factors:Sleep Apnea.  Sonographer:    Wallie Char Referring Phys: [phone] A CALDWELL POWELL JR  Sonographer Comments: Image acquisition challenging due to COPD and Challenging image acquisition due to pt coughing throughout entire exam. IMPRESSIONS  1. Left ventricular ejection fraction, by estimation, is 60 to 65%.  Left ventricular ejection fraction by 2D MOD biplane is 63.1 %. The left ventricle has normal function. The left ventricle has no regional wall motion abnormalities. Left ventricular diastolic parameters are consistent with Grade I diastolic dysfunction (impaired relaxation).  2. Right ventricular systolic function is normal. The right ventricular size is normal. There is normal pulmonary artery systolic pressure. The estimated right ventricular systolic pressure is 33.0 mmHg.  3. The mitral valve is grossly normal. Trivial mitral valve regurgitation. No  evidence of mitral stenosis.  4. The aortic valve is tricuspid. Aortic valve regurgitation is not visualized. No aortic stenosis is present.  5. The inferior vena cava is normal in size with greater than 50% respiratory variability, suggesting right atrial pressure of 3 mmHg. Conclusion(s)/Recommendation(s): No evidence of valvular vegetations on this transthoracic echocardiogram. Consider a transesophageal echocardiogram to exclude infective endocarditis if clinically indicated. FINDINGS  Left Ventricle: Left ventricular ejection fraction, by estimation, is 60 to 65%. Left ventricular ejection fraction by 2D MOD biplane is 63.1 %. The left ventricle has normal function. The left ventricle has no regional wall motion abnormalities. The left ventricular internal cavity size was normal in size. There is no left ventricular hypertrophy. Left ventricular diastolic parameters are consistent with Grade I diastolic dysfunction (impaired relaxation). Right Ventricle: The right ventricular size is normal. No increase in right ventricular wall thickness. Right ventricular systolic function is normal. There is normal pulmonary artery systolic pressure. The tricuspid regurgitant velocity is 2.74 m/s, and  with an assumed right atrial pressure of 3 mmHg, the estimated right ventricular systolic pressure is 33.0 mmHg. Left Atrium: Left atrial size was normal in size. Right Atrium: Right atrial size was normal in size. Pericardium: There is no evidence of pericardial effusion. Mitral Valve: The mitral valve is grossly normal. Trivial mitral valve regurgitation. No evidence of mitral valve stenosis. MV peak gradient, 4.7 mmHg. The mean mitral valve gradient is 2.0 mmHg. Tricuspid Valve: The tricuspid valve is grossly normal. Tricuspid valve regurgitation is mild . No evidence of tricuspid stenosis. Aortic Valve: The aortic valve is tricuspid. Aortic valve regurgitation is not visualized. No aortic stenosis is present. Aortic valve  mean gradient measures 5.0 mmHg. Aortic valve peak gradient measures 8.2 mmHg. Aortic valve area, by VTI measures 1.81 cm. Pulmonic Valve: The pulmonic valve was grossly normal. Pulmonic valve regurgitation is not visualized. No evidence of pulmonic stenosis. Aorta: The aortic root and ascending aorta are structurally normal, with no evidence of dilitation. Venous: The inferior vena cava is normal in size with greater than 50% respiratory variability, suggesting right atrial pressure of 3 mmHg. IAS/Shunts: The atrial septum is grossly normal.  LEFT VENTRICLE PLAX 2D                        Biplane EF (MOD) LVIDd:         3.80 cm         LV Biplane EF:   Left LVIDs:         2.70 cm                          ventricular LV PW:         0.80 cm                          ejection LV IVS:        0.80 cm  fraction by LVOT diam:     1.70 cm                          2D MOD LV SV:         55                               biplane is LV SV Index:   38                               63.1 %. LVOT Area:     2.27 cm                                Diastology                                LV e' medial:    5.58 cm/s LV Volumes (MOD)               LV E/e' medial:  13.9 LV vol d, MOD    61.1 ml       LV e' lateral:   8.64 cm/s A2C:                           LV E/e' lateral: 9.0 LV vol d, MOD    54.0 ml A4C: LV vol s, MOD    23.4 ml A2C: LV vol s, MOD    20.2 ml A4C: LV SV MOD A2C:   37.7 ml LV SV MOD A4C:   54.0 ml LV SV MOD BP:    37.5 ml RIGHT VENTRICLE             IVC RV Basal diam:  3.20 cm     IVC diam: 2.00 cm RV S prime:     13.10 cm/s TAPSE (M-mode): 2.3 cm LEFT ATRIUM             Index        RIGHT ATRIUM           Index LA diam:        2.60 cm 1.81 cm/m   RA Area:     11.70 cm LA Vol (A2C):   24.0 ml 16.75 ml/m  RA Volume:   26.70 ml  18.64 ml/m LA Vol (A4C):   19.3 ml 13.47 ml/m LA Biplane Vol: 21.8 ml 15.22 ml/m  AORTIC VALVE AV Area (Vmax):    1.78 cm AV Area (Vmean):   1.70 cm AV Area (VTI):      1.81 cm AV Vmax:           143.50 cm/s AV Vmean:          106.100 cm/s AV VTI:            0.304 m AV Peak Grad:      8.2 mmHg AV Mean Grad:      5.0 mmHg LVOT Vmax:         112.50 cm/s LVOT Vmean:        79.450 cm/s LVOT VTI:          0.242 m LVOT/AV VTI ratio: 0.80  AORTA Ao Root diam: 2.90 cm Ao  Asc diam:  3.20 cm MITRAL VALVE                TRICUSPID VALVE MV Area (PHT): 3.01 cm     TR Peak grad:   30.0 mmHg MV Area VTI:   2.12 cm     TR Vmax:        274.00 cm/s MV Peak grad:  4.7 mmHg MV Mean grad:  2.0 mmHg     SHUNTS MV Vmax:       1.08 m/s     Systemic VTI:  0.24 m MV Vmean:      59.8 cm/s    Systemic Diam: 1.70 cm MV Decel Time: 252 msec MV E velocity: 77.40 cm/s MV A velocity: 102.00 cm/s MV E/A ratio:  0.76 Lennie Odor MD Electronically signed by Lennie Odor MD Signature Date/Time: 09/17/2022/4:16:58 PM    Final    DG Chest Port 1 View  Result Date: 09/16/2022 CLINICAL DATA:  Cough and dyspnea. EXAM: PORTABLE CHEST 1 VIEW COMPARISON:  Chest radiograph dated September 04, 2022 FINDINGS: The heart size and mediastinal contours are within normal limits. Bilateral lower lobe hazy lung opacities, right worse than the left with right basilar atelectasis, suggesting ongoing infectious/inflammatory process. Small bilateral pleural effusion. The visualized skeletal structures are unremarkable. IMPRESSION: 1. Bilateral lower lobe hazy lung opacities, right worse than the left with right basilar atelectasis, suggesting ongoing infectious/inflammatory process. 2. Small bilateral pleural effusion. Electronically Signed   By: Larose Hires D.O.   On: 09/16/2022 14:35   DG Swallowing Func-Speech Pathology  Result Date: 09/04/2022 Table formatting from the original result was not included. Modified Barium Swallow Study Patient Details Name: BLAYRE BUCHKO MRN: [id number] Date of Birth: [birth year] Today's Date: 09/04/2022 HPI/PMH: HPI: Patient is a 72 y.o. female with PMH: Multiple Sclerosis, recurrent PNA,  COPD/asthma, GERD, hiatal hernia, hypothyroidism, bilateral vocal cord paralysis, depression, cervical cancer and h/o UTI. MBS completed in July of 2022 did not show any aspiration of liquids or solids but did show pharyngeal retention of solids which decreased with subsequent swallows. She presented to the hospital on 08/31/22 with worsening SOB. She had brochoscopy done by pulmonology recently and was started on Augmentin due to cultures growing strep PNA. She has been off of her IVIG for approximately 3 months. CT chest from 08/31/22 showed debris in RLL bronchus, b/l lower lobe bronchial wall thickening with multifocal mucus plugging, scattered areas of GGO b/l, RLL area of consolidation. MD started h her on PO diet of Dys 3 (mechanical soft) solids and nectar thick liquids.  Pt reports being very fatigued. Limited barium provided due to pt's potential ileus upon admit.  Clinical Impression: Clinical Impression: Patient presents with mild pharyngeal dypshagia mostly characterized by impaired hyo-laryngeal elevation, laryngeal closure, epiglottic deflection as epiglottis contacts posterior pharyngeal wall with swallow.  Pt anterior curvature of spine appears to contribute to impaired epiglottic deflection. An episode of retrograde propulsion of barium proximal in pharynx observed (not to oropharyngeal space).  Laryngeal penetration of thin and nectar liquids noted as well as pyriform sinus retention which pt clears with breath=hold sustained 2nd swallow.  Various postures including head turn right with and without head of chair reclined with head tilt were not effective to decrease retention.  Prominent PES noted as well.  Swallow function appears largely consisent with findings from most recent MBS in 2022.  Will provide pt with exercises to mitigate her dypshagia.  No aspiration observed and cough observed frequently. Factors that  may increase risk of adverse event in presence of aspiration Rubye Oaks & Clearance Coots 2021):  Factors that may increase risk of adverse event in presence of aspiration Rubye Oaks & Clearance Coots 2021): Respiratory or GI disease; Poor general health and/or compromised immunity; Reduced saliva Recommendations/Plan: Swallowing Evaluation Recommendations Swallowing Evaluation Recommendations Recommendations: PO diet PO Diet Recommendation: Regular; Thin liquids (Level 0) Liquid Administration via: Cup; Straw Medication Administration: Other (Comment) (as tolerated) Supervision: Patient able to self-feed Swallowing strategies  : Slow rate; Small bites/sips; effortful swallow; Multiple dry swallows after each bite/sip Postural changes: Position pt fully upright for meals; Stay upright 30-60 min after meals Oral care recommendations: Oral care BID (2x/day) Treatment Plan Treatment Plan Treatment recommendations: Therapy as outlined in treatment plan below Follow-up recommendations: Follow physicians's recommendations for discharge plan and follow up therapies Functional status assessment: Patient has not had a recent decline in their functional status. Treatment frequency: Min 1x/week Treatment duration: 1 week Interventions: Aspiration precaution training; Oropharyngeal exercises; Compensatory techniques; Patient/family education Recommendations Recommendations for follow up therapy are one component of a multi-disciplinary discharge planning process, led by the attending physician.  Recommendations may be updated based on patient status, additional functional criteria and insurance authorization. Assessment: Orofacial Exam: Orofacial Exam Oral Cavity: Oral Hygiene: WFL Oral Cavity - Dentition: Adequate natural dentition Orofacial Anatomy: WFL Oral Motor/Sensory Function: WFL Anatomy: Anatomy: Other (Comment); Suspected cervical osteophytes; Prominent cricopharyngeus Boluses Administered: Boluses Administered Boluses Administered: Thin liquids (Level 0); Mildly thick liquids (Level 2, nectar thick); Solid  Oral Impairment  Domain: Oral Impairment Domain Lip Closure: No labial escape Tongue control during bolus hold: Cohesive bolus between tongue to palatal seal Bolus preparation/mastication: Timely and efficient chewing and mashing Bolus transport/lingual motion: Brisk tongue motion Oral residue: Complete oral clearance Location of oral residue : Floor of mouth Initiation of pharyngeal swallow : Pyriform sinuses  Pharyngeal Impairment Domain: Pharyngeal Impairment Domain Soft palate elevation: No bolus between soft palate (SP)/pharyngeal wall (PW) Laryngeal elevation: Partial superior movement of thyroid cartilage/partial approximation of arytenoids to epiglottic petiole Anterior hyoid excursion: Partial anterior movement Epiglottic movement: Partial inversion Laryngeal vestibule closure: Incomplete, narrow column air/contrast in laryngeal vestibule Pharyngeal stripping wave : Present - diminished Pharyngoesophageal segment opening: Partial distention/partial duration, partial obstruction of flow Tongue base retraction: Trace column of contrast or air between tongue base and PPW Pharyngeal residue: Collection of residue within or on pharyngeal structures Location of pharyngeal residue: Valleculae; Pharyngeal wall; Pyriform sinuses; Tongue base  Esophageal Impairment Domain: Esophageal Impairment Domain Esophageal clearance upright position: Esophageal retention Pill: Esophageal Impairment Domain Esophageal clearance upright position: Esophageal retention Penetration/Aspiration Scale Score: Penetration/Aspiration Scale Score 1.  Material does not enter airway: Solid; Mildly thick liquids (Level 2, nectar thick) 2.  Material enters airway, remains ABOVE vocal cords then ejected out: Thin liquids (Level 0) Compensatory Strategies: Compensatory Strategies Compensatory strategies: Yes Effortful swallow: Effective Effective Effortful Swallow: Solid Right head turn: Ineffective Effective Right Head Turn: Thin liquid (Level 0); Mildly thick  liquid (Level 2, nectar thick) Ineffective Right Head Turn: Thin liquid (Level 0); Mildly thick liquid (Level 2, nectar thick) Reclining posture: Ineffective Effective Reclining Posture: Thin liquid (Level 0); Mildly thick liquid (Level 2, nectar thick) Ineffective Reclining Posture: Thin liquid (Level 0); Mildly thick liquid (Level 2, nectar thick) Posterior head tilt: Ineffective Ineffective Posterior head tilt: Thin liquid (Level 0); Mildly thick liquid (Level 2, nectar thick)   General Information: Caregiver present: No  Diet Prior to this Study: Dysphagia 3 (mechanical soft); Mildly thick liquids (Level  2, nectar thick)   Temperature : Normal   Respiratory Status: WFL   Supplemental O2: None (Room air)   History of Recent Intubation: No  Behavior/Cognition: Alert; Cooperative; Pleasant mood Self-Feeding Abilities: Able to self-feed Baseline vocal quality/speech: Other (comment); Dysphonic Volitional Cough: Able to elicit Volitional Swallow: Able to elicit Exam Limitations: No limitations Goal Planning: Prognosis for improved oropharyngeal function: Fair Barriers to Reach Goals: Time post onset No data recorded Patient/Family Stated Goal: pt reports she is feeling better but complains of itching Consulted and agree with results and recommendations: Patient Pain: Pain Assessment Pain Assessment: No/denies pain Pain Score: 0 Faces Pain Scale: 4 Breathing: 0 Negative Vocalization: 0 Facial Expression: 0 Body Language: 0 Consolability: 0 PAINAD Score: 0 Pain Location: throat from coughing Pain Descriptors / Indicators: Grimacing; Discomfort Pain Intervention(s): Monitored during session End of Session: Start Time:SLP Start Time (ACUTE ONLY): 1040 Stop Time: SLP Stop Time (ACUTE ONLY): 1100 Time Calculation:SLP Time Calculation (min) (ACUTE ONLY): 20 min Charges: SLP Evaluations $ SLP Speech Visit: 1 Visit SLP Evaluations $BSS Swallow: 1 Procedure $Swallowing Treatment: 1 Procedure SLP visit diagnosis: SLP Visit  Diagnosis: Dysphagia, pharyngeal phase (R13.13); Dysphagia, pharyngoesophageal phase (R13.14) Past Medical History: Past Medical History: Diagnosis Date  Arthritis   oa  Asthma   Bilateral vocal cord paralysis   Bronchiectasis (HCC)   Cervical cancer (HCC) 2005  Depression   GERD (gastroesophageal reflux disease)   Hiatal hernia   Hypothyroidism   Immunodeficiency (HCC)   Gets IVIG infusions  Insomnia   Multiple sclerosis (HCC) 1996  Osteoporosis   Pneumonia 2024, 2014, 1992  Urine retention  Past Surgical History: Past Surgical History: Procedure Laterality Date  ABDOMINAL HYSTERECTOMY  03/25/2003  for cervical cancer, complete  BRONCHIAL BIOPSY  10/26/2020  Procedure: BRONCHIAL BIOPSIES;  Surgeon: Josephine Igo, DO;  [facility];  Service: Pulmonary;;  BRONCHIAL BRUSHINGS  10/26/2020  Procedure: BRONCHIAL BRUSHINGS;  Surgeon: Josephine Igo, DO;  [facility];  Service: Pulmonary;;  BRONCHIAL WASHINGS  10/26/2020  Procedure: BRONCHIAL WASHINGS;  Surgeon: Josephine Igo, DO;  [facility];  Service: Pulmonary;;  BRONCHIAL WASHINGS  08/19/2022  Procedure: BRONCHIAL WASHINGS;  Surgeon: Josephine Igo, DO;  [facility];  Service: Cardiopulmonary;;  CATARACT EXTRACTION, BILATERAL Bilateral   lens for cataracts  CONVERSION TO TOTAL HIP Right 09/18/2015  Procedure: CONVERSION OF RIGHT HEMI ARTHROPLASTY TO TOTAL HIP ARTHROPLASTY ACETABULAR REVISON ;  Surgeon: Durene Romans, MD;  Location: [facility];  Service: Orthopedics;  Laterality: Right;  HIP ARTHROPLASTY Right 03/01/2013  Procedure: ARTHROPLASTY BIPOLAR HIP;  Surgeon: Shelda Pal, MD;  Location: [facility];  Service: Orthopedics;  Laterality: Right;  HIP CLOSED REDUCTION Right 03/12/2013  Procedure: CLOSED REDUCTION HIP;  Surgeon: [name], MD;  [facility];  Service: Orthopedics;  Laterality: Right;  HIP CLOSED REDUCTION Right 03/19/2013  Procedure: CLOSED REDUCTION HIP;  Surgeon: Javier Docker, MD;   [facility];  Service: Orthopedics;  Laterality: Right;  HIP CLOSED REDUCTION Right 11/10/2015  Procedure: CLOSED MANIPULATION HIP;  Surgeon: Samson Frederic, MD;  [facility];  Service: Orthopedics;  Laterality: Right;  HIP CLOSED REDUCTION Right 06/05/2017  Procedure: CLOSED REDUCTION HIP;  Surgeon: Ranee Gosselin, MD;  [facility];  Service: Orthopedics;  Laterality: Right;  SMALL INTESTINE SURGERY    TOTAL HIP REVISION Right 06/06/2017  Procedure: TOTAL HIP REVISION;  Surgeon: Ranee Gosselin, MD;  [facility];  Service: Orthopedics;  Laterality: Right;  VESICOVAGINAL FISTULA  CLOSURE W/ TAH  03/25/2003  VIDEO BRONCHOSCOPY N/A 10/26/2020  Procedure: VIDEO BRONCHOSCOPY WITH FLUORO;  Surgeon: Josephine Igo, DO;  [facility];  Service: Pulmonary;  Laterality: N/A;  VIDEO BRONCHOSCOPY Left 08/19/2022  Procedure: VIDEO BRONCHOSCOPY WITH FLUORO; WITH BAL;  Surgeon: Josephine Igo, DO;  [facility];  Service: Cardiopulmonary;  Laterality: Left; Rolena Infante, MS [facility] SLP Acute Rehab Services Office [phone] Chales Abrahams 09/04/2022, 10:11 AM  DG Chest Port 1 View  Result Date: 09/04/2022 CLINICAL DATA:  [id number] CAP (community acquired pneumonia) [phone] EXAM: PORTABLE CHEST - 1 VIEW COMPARISON:  08/31/2022 FINDINGS: The focal areas of airspace consolidation previously seen in the lung bases are slightly less conspicuous but there has been some increase in scattered interstitial opacities predominantly in the lung bases. Left infrahilar airspace opacities persist. No effusion. Heart size and mediastinal contours are within normal limits. No effusion. Old fracture deformity of the proximal right humerus. IMPRESSION: 1. Persistent left infrahilar airspace opacities. 2. Slightly increased interstitial opacities in the lung bases. Electronically Signed   By: Corlis Leak M.D.   On: 09/04/2022 08:14   CT Angio Chest PE W and/or Wo Contrast  Result Date: 08/31/2022 CLINICAL  DATA:  Pulmonary embolism suspected, high probability. History of COPD, asthma, recurrent pneumonia. EXAM: CT ANGIOGRAPHY CHEST WITH CONTRAST TECHNIQUE: Multidetector CT imaging of the chest was performed using the standard protocol during bolus administration of intravenous contrast. Multiplanar CT image reconstructions and MIPs were obtained to evaluate the vascular anatomy. RADIATION DOSE REDUCTION: This exam was performed according to the departmental dose-optimization program which includes automated exposure control, adjustment of the mA and/or kV according to patient size and/or use of iterative reconstruction technique. CONTRAST:  75mL OMNIPAQUE IOHEXOL 350 MG/ML SOLN COMPARISON:  08/07/2022. FINDINGS: Cardiovascular: The heart is normal in size and there is a trace pericardial effusion. There is atherosclerotic calcification of the aorta without evidence of aneurysm. Pulmonary trunk is normal in caliber. No evidence of pulmonary embolism. Evaluation is limited due to respiratory motion artifact. Mediastinum/Nodes: A prominent lymph node is present in the prevascular space measuring 9 mm. No hilar or axillary lymphadenopathy. The trachea and esophagus are within normal limits. Lungs/Pleura: Debris is present in the right lower lobe bronchus. There is bronchial wall thickening in the lower lobes bilaterally with multifocal mucous plugging. Scattered ground-glass opacities are present in the lungs bilaterally with a basilar predominance. Consolidation is noted in the right lower lobe. No effusion or pneumothorax. Upper Abdomen: No acute abnormality. Musculoskeletal: No acute osseous abnormality. Review of the MIP images confirms the above findings. IMPRESSION: 1. No evidence of pulmonary embolism. Evaluation is limited due to respiratory motion artifact. 2. Scattered ground-glass opacities are noted in the lungs bilaterally. Bronchial wall thickening in the lower lobes bilaterally with debris in the right  lower lobe bronchus and multifocal mucous plugging and consolidation of the right lung base, concerning for pneumonia. Atypical pneumonia should be considered in the differential diagnosis. 3. Aortic atherosclerosis. Electronically Signed   By: Thornell Sartorius M.D.   On: 08/31/2022 22:18   DG Abd 1 View  Result Date: 08/31/2022 CLINICAL DATA:  Constipation. EXAM: ABDOMEN - 1 VIEW COMPARISON:  Radiograph 06/27/2022, hip CT 06/19/2022 FINDINGS: Moderate stool in the right colon. No significant formed stool distally. Scattered air throughout small bowel in the central and left abdomen. Multiple surgical clips in the pelvis. Fluffy calcifications projecting over the pelvis or in the soft tissues on prior hip CT. Right hip arthroplasty.  IMPRESSION: 1. Moderate stool in the right colon. No significant formed stool distally. 2. Air scattered throughout small bowel that is nondilated, possible ileus. Electronically Signed   By: Narda Rutherford M.D.   On: 08/31/2022 19:49   DG Chest 2 View  Result Date: 08/31/2022 CLINICAL DATA:  Shortness of breath with worsening cough. EXAM: CHEST - 2 VIEW COMPARISON:  06/27/2022 FINDINGS: Patchy bibasilar airspace disease is suspicious for pneumonia. No substantial pleural effusion. No pulmonary edema. The cardiopericardial silhouette is within normal limits for size. No acute bony abnormality. Telemetry leads overlie the chest. IMPRESSION: Patchy bibasilar airspace disease suspicious for pneumonia. Electronically Signed   By: Kennith Center M.D.   On: 08/31/2022 16:01     Subjective:   Discharge Exam: Vitals:   09/24/22 0738 09/24/22 1357  BP:  (!) 110/59  Pulse:  87  Resp:  16  Temp:  98.8 F (37.1 C)  SpO2: 99% 100%   Vitals:   09/24/22 0500 09/24/22 0546 09/24/22 0738 09/24/22 1357  BP:  (!) 100/50  (!) 110/59  Pulse:  71  87  Resp:  16  16  Temp:  98.8 F (37.1 C)  98.8 F (37.1 C)  TempSrc:  Oral  Oral  SpO2:  100% 99% 100%  Weight: 46.6 kg     Height:         Physical Exam: GEN: NAD, alert and oriented x 3 HEENT: NCAT, PERRL, EOMI, sclera clear, MMM PULM: CTAB w/o wheezes/crackles, normal respiratory effort CV: RRR w/o M/G/R GI: abd soft, NTND, NABS, no R/G/M MSK: no peripheral edema, muscle strength globally intact 5/5 bilateral upper/lower extremities NEURO: CN II-XII intact, no focal deficits, sensation to light touch intact PSYCH: normal mood/affect Integumentary: dry/intact, no rashes or wounds    The results of significant diagnostics from this hospitalization (including imaging, microbiology, ancillary and laboratory) are listed below for reference.     Microbiology: Recent Results (from the past 240 hour(s))  Culture, blood (single)     Status: Abnormal   Collection Time: 09/16/22  2:08 PM   Specimen: BLOOD RIGHT FOREARM  Result Value Ref Range Status   Specimen Description   Final    BLOOD RIGHT FOREARM Performed at [facility], [address] [place], [place] [address]    Special Requests   Final    BOTTLES DRAWN AEROBIC AND ANAEROBIC Blood Culture adequate volume Performed at [facility], [address], [place], [place] [address]    Culture  Setup Time   Final    GRAM POSITIVE COCCI IN BOTH AEROBIC AND ANAEROBIC BOTTLES CRITICAL RESULT CALLED TO, READ BACK BY AND VERIFIED WITH: Thana Ates [phone] FCP Performed at [facility], [address], [place], [place] [address]    Culture STREPTOCOCCUS PNEUMONIAE (A)  Final   Report Status 09/19/2022 FINAL  Final   Organism ID, Bacteria STREPTOCOCCUS PNEUMONIAE  Final      Susceptibility   Streptococcus pneumoniae - MIC*    ERYTHROMYCIN >=8 RESISTANT Resistant     LEVOFLOXACIN 0.5 SENSITIVE Sensitive     VANCOMYCIN 0.5 SENSITIVE Sensitive     PENICILLIN (meningitis) 1 RESISTANT Resistant     PENO - penicillin 1      PENICILLIN (non-meningitis) 1 SENSITIVE Sensitive     PENICILLIN (oral) 1 INTERMEDIATE  Intermediate     CEFTRIAXONE (non-meningitis) 0.5 SENSITIVE Sensitive     CEFTRIAXONE (meningitis) 0.5 SENSITIVE Sensitive     * STREPTOCOCCUS PNEUMONIAE  Blood Culture ID Panel (Reflexed)     Status: Abnormal   Collection Time: 09/16/22  2:08 PM  Result Value Ref Range Status   Enterococcus faecalis NOT DETECTED NOT DETECTED Final   Enterococcus Faecium NOT DETECTED NOT DETECTED Final   Listeria monocytogenes NOT DETECTED NOT DETECTED Final   Staphylococcus species NOT DETECTED NOT DETECTED Final   Staphylococcus aureus (BCID) NOT DETECTED NOT DETECTED Final   Staphylococcus epidermidis NOT DETECTED NOT DETECTED Final   Staphylococcus lugdunensis NOT DETECTED NOT DETECTED Final   Streptococcus species DETECTED (A) NOT DETECTED Final    Comment: CRITICAL RESULT CALLED TO, READ BACK BY AND VERIFIED WITH: PHARMD J. GADHIA [phone] FCP    Streptococcus agalactiae NOT DETECTED NOT DETECTED Final   Streptococcus pneumoniae DETECTED (A) NOT DETECTED Final    Comment: CRITICAL RESULT CALLED TO, READ BACK BY AND VERIFIED WITH: PHARMD J. Teodoro Kil [phone] FCP    Streptococcus pyogenes NOT DETECTED NOT DETECTED Final   A.calcoaceticus-baumannii NOT DETECTED NOT DETECTED Final   Bacteroides fragilis NOT DETECTED NOT DETECTED Final   Enterobacterales NOT DETECTED NOT DETECTED Final   Enterobacter cloacae complex NOT DETECTED NOT DETECTED Final   Escherichia coli NOT DETECTED NOT DETECTED Final   Klebsiella aerogenes NOT DETECTED NOT DETECTED Final   Klebsiella oxytoca NOT DETECTED NOT DETECTED Final   Klebsiella pneumoniae NOT DETECTED NOT DETECTED Final   Proteus species NOT DETECTED NOT DETECTED Final   Salmonella species NOT DETECTED NOT DETECTED Final   Serratia marcescens NOT DETECTED NOT DETECTED Final   Haemophilus influenzae NOT DETECTED NOT DETECTED Final   Neisseria meningitidis NOT DETECTED NOT DETECTED Final   Pseudomonas aeruginosa NOT DETECTED NOT DETECTED Final    Stenotrophomonas maltophilia NOT DETECTED NOT DETECTED Final   Candida albicans NOT DETECTED NOT DETECTED Final   Candida auris NOT DETECTED NOT DETECTED Final   Candida glabrata NOT DETECTED NOT DETECTED Final   Candida krusei NOT DETECTED NOT DETECTED Final   Candida parapsilosis NOT DETECTED NOT DETECTED Final   Candida tropicalis NOT DETECTED NOT DETECTED Final   Cryptococcus neoformans/gattii NOT DETECTED NOT DETECTED Final    Comment: Performed at [facility], [address] [place], [place] [address]  MRSA Next Gen by PCR, Nasal     Status: None   Collection Time: 09/16/22  5:38 PM   Specimen: Nasal Mucosa; Nasal Swab  Result Value Ref Range Status   MRSA by PCR Next Gen NOT DETECTED NOT DETECTED Final    Comment: (NOTE) The GeneXpert MRSA Assay (FDA approved for NASAL specimens only), is one component of a comprehensive MRSA colonization surveillance program. It is not intended to diagnose MRSA infection nor to guide or monitor treatment for MRSA infections. Test performance is not FDA approved in patients less than 82 years old. Performed at [facility], [address], [place], [place] [address]   Respiratory (~20 pathogens) panel by PCR     Status: Abnormal   Collection Time: 09/16/22  6:47 PM   Specimen: Nasopharyngeal Swab; Respiratory  Result Value Ref Range Status   Adenovirus NOT DETECTED NOT DETECTED Final   Coronavirus 229E NOT DETECTED NOT DETECTED Final    Comment: (NOTE) The Coronavirus on the Respiratory Panel, DOES NOT test for the novel  Coronavirus (2019 nCoV)    Coronavirus HKU1 NOT DETECTED NOT DETECTED Final   Coronavirus NL63 NOT DETECTED NOT DETECTED Final   Coronavirus OC43 NOT DETECTED NOT DETECTED Final   Metapneumovirus NOT  DETECTED NOT DETECTED Final   Rhinovirus / Enterovirus DETECTED (A) NOT DETECTED Final   Influenza A NOT DETECTED NOT DETECTED Final   Influenza B NOT DETECTED NOT DETECTED Final   Parainfluenza  Virus 1 NOT DETECTED NOT DETECTED Final   Parainfluenza Virus 2 NOT DETECTED NOT DETECTED Final   Parainfluenza Virus 3 NOT DETECTED NOT DETECTED Final   Parainfluenza Virus 4 NOT DETECTED NOT DETECTED Final   Respiratory Syncytial Virus NOT DETECTED NOT DETECTED Final   Bordetella pertussis NOT DETECTED NOT DETECTED Final   Bordetella Parapertussis NOT DETECTED NOT DETECTED Final   Chlamydophila pneumoniae NOT DETECTED NOT DETECTED Final   Mycoplasma pneumoniae NOT DETECTED NOT DETECTED Final    Comment: Performed at [facility], [address] [place], [place] [address]  Culture, blood (single) w Reflex to ID Panel     Status: None   Collection Time: 09/16/22  7:14 PM   Specimen: BLOOD  Result Value Ref Range Status   Specimen Description   Final    BLOOD BLOOD RIGHT ARM Performed at [facility], [address], [place], [place] [address]    Special Requests   Final    BOTTLES DRAWN AEROBIC ONLY Blood Culture adequate volume Performed at [facility], [address] [place], [place] [address]    Culture   Final    NO GROWTH 5 DAYS Performed at [facility], [address] [place], [place] [address]    Report Status 09/21/2022 FINAL  Final  Culture, blood (single) w Reflex to ID Panel     Status: None   Collection Time: 09/16/22  7:18 PM   Specimen: BLOOD  Result Value Ref Range Status   Specimen Description   Final    BLOOD BLOOD LEFT HAND Performed at [facility], [address] [place], [place] [address]    Special Requests   Final    BOTTLES DRAWN AEROBIC ONLY Blood Culture adequate volume Performed at [facility], [address] [place], [place] [address]    Culture   Final    NO GROWTH 5 DAYS Performed at [facility], [address], [place], [place] [address]    Report Status 09/21/2022 FINAL  Final  Culture, blood (Routine X 2) w Reflex to ID Panel     Status:  None   Collection Time: 09/18/22  3:33 PM   Specimen: BLOOD LEFT HAND  Result Value Ref Range Status   Specimen Description   Final    BLOOD LEFT HAND Performed at [facility], [address] [place], [place] [address]    Special Requests   Final    AEROBIC BOTTLE ONLY Blood Culture adequate volume Performed at [facility], [address], [place], [place] [address]    Culture   Final    NO GROWTH 5 DAYS Performed at [facility], [address], [place], [place] [address]    Report Status 09/23/2022 FINAL  Final  Culture, blood (Routine X 2) w Reflex to ID Panel     Status: None   Collection Time: 09/18/22  3:43 PM   Specimen: BLOOD LEFT HAND  Result Value Ref Range Status   Specimen Description   Final    BLOOD LEFT HAND Performed at [facility], [address], [place], [place] [address]    Special Requests   Final    AEROBIC BOTTLE ONLY  Blood Culture adequate volume Performed at [facility], [address] [place], [place] [address]    Culture   Final    NO GROWTH 5 DAYS Performed at [facility], [address] [place], [place] [address]    Report Status 09/23/2022 FINAL  Final     Labs: BNP (last 3 results) Recent Labs    08/31/22 1703  BNP 106.3*   Basic Metabolic Panel: Recent Labs  Lab 09/18/22 0319 09/21/22 0710 09/22/22 0435 09/23/22 0548 09/24/22 0535  NA 138 135 138 133* 136  K 4.0 4.0 4.4 3.9 4.0  CL 103 96* 95* 96* 98  CO2 26 30 31 28 29   GLUCOSE 103* 103* 101* 90 98  BUN 13 11 11 8 10   CREATININE 0.52 0.48 0.57 0.52 0.58  CALCIUM 9.5 8.9 9.4 9.0 8.8*  MG 1.7 1.8 1.9 2.0 2.0  PHOS  --  3.7 3.4 3.9 3.1   Liver Function Tests: Recent Labs  Lab 09/21/22 0710 09/22/22 0435 09/23/22 0548 09/24/22 0535  AST 17 18 17 16   ALT 18 17 15 16   ALKPHOS 65 63 69 64  BILITOT 0.1* 0.2* 0.3 0.3  PROT 5.1* 5.1* 5.4* 5.3*  ALBUMIN 2.2* 2.3* 2.4* 2.5*    No results for input(s): "LIPASE", "AMYLASE" in the last 168 hours. No results for input(s): "AMMONIA" in the last 168 hours. CBC: Recent Labs  Lab 09/19/22 0738 09/21/22 0710 09/22/22 0435 09/23/22 0548 09/24/22 0535  WBC 14.2* 12.9* 13.5* 9.9 8.3  NEUTROABS  --  8.6* 8.8* 6.3 4.7  HGB 9.1* 8.9* 8.8* 9.1* 8.8*  HCT 29.2* 28.9* 28.8* 29.6* 29.1*  MCV 92.4 92.6 93.5 91.6 93.3  PLT 727* 655* 607* 628* 619*   Cardiac Enzymes: No results for input(s): "CKTOTAL", "CKMB", "CKMBINDEX", "TROPONINI" in the last 168 hours. BNP: Invalid input(s): "POCBNP" CBG: No results for input(s): "GLUCAP" in the last 168 hours. D-Dimer No results for input(s): "DDIMER" in the last 72 hours. Hgb A1c No results for input(s): "HGBA1C" in the last 72 hours. Lipid Profile No results for input(s): "CHOL", "HDL", "LDLCALC", "TRIG", "CHOLHDL", "LDLDIRECT" in the last 72 hours. Thyroid function studies No results for input(s): "TSH", "T4TOTAL", "T3FREE", "THYROIDAB" in the last 72 hours.  Invalid input(s): "FREET3" Anemia work up No results for input(s): "VITAMINB12", "FOLATE", "FERRITIN", "TIBC", "IRON", "RETICCTPCT" in the last 72 hours. Urinalysis    Component Value Date/Time   COLORURINE YELLOW 09/16/2022 1848   APPEARANCEUR CLEAR 09/16/2022 1848   LABSPEC 1.006 09/16/2022 1848   PHURINE 6.0 09/16/2022 1848   GLUCOSEU NEGATIVE 09/16/2022 1848   GLUCOSEU NEGATIVE 05/26/2022 1034   HGBUR SMALL (A) 09/16/2022 1848   BILIRUBINUR NEGATIVE 09/16/2022 1848   BILIRUBINUR negative 07/10/2021 0919   KETONESUR 5 (A) 09/16/2022 1848   PROTEINUR NEGATIVE 09/16/2022 1848   UROBILINOGEN 0.2 05/26/2022 1034   NITRITE NEGATIVE 09/16/2022 1848   LEUKOCYTESUR LARGE (A) 09/16/2022 1848   Sepsis Labs Recent Labs  Lab 09/21/22 0710 09/22/22 0435 09/23/22 0548 09/24/22 0535  WBC 12.9* 13.5* 9.9 8.3   Microbiology Recent Results (from the past 240 hour(s))  Culture, blood (single)     Status:  Abnormal   Collection Time: 09/16/22  2:08 PM   Specimen: BLOOD RIGHT FOREARM  Result Value Ref Range Status   Specimen Description   Final    BLOOD RIGHT FOREARM Performed at [facility], [address], [place], [place] [address]    Special Requests   Final  BOTTLES DRAWN AEROBIC AND ANAEROBIC Blood Culture adequate volume Performed at [facility], [address], [place], [place] [address]    Culture  Setup Time   Final    GRAM POSITIVE COCCI IN BOTH AEROBIC AND ANAEROBIC BOTTLES CRITICAL RESULT CALLED TO, READ BACK BY AND VERIFIED WITH: Thana Ates [phone] FCP Performed at [facility], [address] [place], [place] [address]    Culture STREPTOCOCCUS PNEUMONIAE (A)  Final   Report Status 09/19/2022 FINAL  Final   Organism ID, Bacteria STREPTOCOCCUS PNEUMONIAE  Final      Susceptibility   Streptococcus pneumoniae - MIC*    ERYTHROMYCIN >=8 RESISTANT Resistant     LEVOFLOXACIN 0.5 SENSITIVE Sensitive     VANCOMYCIN 0.5 SENSITIVE Sensitive     PENICILLIN (meningitis) 1 RESISTANT Resistant     PENO - penicillin 1      PENICILLIN (non-meningitis) 1 SENSITIVE Sensitive     PENICILLIN (oral) 1 INTERMEDIATE Intermediate     CEFTRIAXONE (non-meningitis) 0.5 SENSITIVE Sensitive     CEFTRIAXONE (meningitis) 0.5 SENSITIVE Sensitive     * STREPTOCOCCUS PNEUMONIAE  Blood Culture ID Panel (Reflexed)     Status: Abnormal   Collection Time: 09/16/22  2:08 PM  Result Value Ref Range Status   Enterococcus faecalis NOT DETECTED NOT DETECTED Final   Enterococcus Faecium NOT DETECTED NOT DETECTED Final   Listeria monocytogenes NOT DETECTED NOT DETECTED Final   Staphylococcus species NOT DETECTED NOT DETECTED Final   Staphylococcus aureus (BCID) NOT DETECTED NOT DETECTED Final   Staphylococcus epidermidis NOT DETECTED NOT DETECTED Final   Staphylococcus lugdunensis NOT DETECTED NOT DETECTED Final   Streptococcus species DETECTED (A)  NOT DETECTED Final    Comment: CRITICAL RESULT CALLED TO, READ BACK BY AND VERIFIED WITH: PHARMD J. GADHIA [phone] FCP    Streptococcus agalactiae NOT DETECTED NOT DETECTED Final   Streptococcus pneumoniae DETECTED (A) NOT DETECTED Final    Comment: CRITICAL RESULT CALLED TO, READ BACK BY AND VERIFIED WITH: PHARMD J. GADHIA [phone] FCP    Streptococcus pyogenes NOT DETECTED NOT DETECTED Final   A.calcoaceticus-baumannii NOT DETECTED NOT DETECTED Final   Bacteroides fragilis NOT DETECTED NOT DETECTED Final   Enterobacterales NOT DETECTED NOT DETECTED Final   Enterobacter cloacae complex NOT DETECTED NOT DETECTED Final   Escherichia coli NOT DETECTED NOT DETECTED Final   Klebsiella aerogenes NOT DETECTED NOT DETECTED Final   Klebsiella oxytoca NOT DETECTED NOT DETECTED Final   Klebsiella pneumoniae NOT DETECTED NOT DETECTED Final   Proteus species NOT DETECTED NOT DETECTED Final   Salmonella species NOT DETECTED NOT DETECTED Final   Serratia marcescens NOT DETECTED NOT DETECTED Final   Haemophilus influenzae NOT DETECTED NOT DETECTED Final   Neisseria meningitidis NOT DETECTED NOT DETECTED Final   Pseudomonas aeruginosa NOT DETECTED NOT DETECTED Final   Stenotrophomonas maltophilia NOT DETECTED NOT DETECTED Final   Candida albicans NOT DETECTED NOT DETECTED Final   Candida auris NOT DETECTED NOT DETECTED Final   Candida glabrata NOT DETECTED NOT DETECTED Final   Candida krusei NOT DETECTED NOT DETECTED Final   Candida parapsilosis NOT DETECTED NOT DETECTED Final   Candida tropicalis NOT DETECTED NOT DETECTED Final   Cryptococcus neoformans/gattii NOT DETECTED NOT DETECTED Final    Comment: Performed at [facility], [address] [place], [place] [address]  MRSA Next Gen by PCR, Nasal     Status: None   Collection Time: 09/16/22  5:38 PM  Specimen: Nasal Mucosa; Nasal Swab  Result Value Ref Range Status   MRSA by PCR Next Gen NOT DETECTED NOT DETECTED Final     Comment: (NOTE) The GeneXpert MRSA Assay (FDA approved for NASAL specimens only), is one component of a comprehensive MRSA colonization surveillance program. It is not intended to diagnose MRSA infection nor to guide or monitor treatment for MRSA infections. Test performance is not FDA approved in patients less than 63 years old. Performed at [facility], [address] [place], [place] [address]   Respiratory (~20 pathogens) panel by PCR     Status: Abnormal   Collection Time: 09/16/22  6:47 PM   Specimen: Nasopharyngeal Swab; Respiratory  Result Value Ref Range Status   Adenovirus NOT DETECTED NOT DETECTED Final   Coronavirus 229E NOT DETECTED NOT DETECTED Final    Comment: (NOTE) The Coronavirus on the Respiratory Panel, DOES NOT test for the novel  Coronavirus (2019 nCoV)    Coronavirus HKU1 NOT DETECTED NOT DETECTED Final   Coronavirus NL63 NOT DETECTED NOT DETECTED Final   Coronavirus OC43 NOT DETECTED NOT DETECTED Final   Metapneumovirus NOT DETECTED NOT DETECTED Final   Rhinovirus / Enterovirus DETECTED (A) NOT DETECTED Final   Influenza A NOT DETECTED NOT DETECTED Final   Influenza B NOT DETECTED NOT DETECTED Final   Parainfluenza Virus 1 NOT DETECTED NOT DETECTED Final   Parainfluenza Virus 2 NOT DETECTED NOT DETECTED Final   Parainfluenza Virus 3 NOT DETECTED NOT DETECTED Final   Parainfluenza Virus 4 NOT DETECTED NOT DETECTED Final   Respiratory Syncytial Virus NOT DETECTED NOT DETECTED Final   Bordetella pertussis NOT DETECTED NOT DETECTED Final   Bordetella Parapertussis NOT DETECTED NOT DETECTED Final   Chlamydophila pneumoniae NOT DETECTED NOT DETECTED Final   Mycoplasma pneumoniae NOT DETECTED NOT DETECTED Final    Comment: Performed at [facility], [address], [place], [place] [address]  Culture, blood (single) w Reflex to ID Panel     Status: None   Collection Time: 09/16/22  7:14 PM   Specimen: BLOOD  Result Value Ref Range  Status   Specimen Description   Final    BLOOD BLOOD RIGHT ARM Performed at [facility], [address] [place], [place] [address]    Special Requests   Final    BOTTLES DRAWN AEROBIC ONLY Blood Culture adequate volume Performed at [facility], [address] [place], [place] [address]    Culture   Final    NO GROWTH 5 DAYS Performed at [facility], [address], [place], [place] [address]    Report Status 09/21/2022 FINAL  Final  Culture, blood (single) w Reflex to ID Panel     Status: None   Collection Time: 09/16/22  7:18 PM   Specimen: BLOOD  Result Value Ref Range Status   Specimen Description   Final    BLOOD BLOOD LEFT HAND Performed at [facility], [address], [place], [place] [address]    Special Requests   Final    BOTTLES DRAWN AEROBIC ONLY Blood Culture adequate volume Performed at [facility], [address] [place], [place] [address]    Culture   Final    NO GROWTH 5 DAYS Performed at [facility], [address] [place], [place] [address]    Report Status 09/21/2022 FINAL  Final  Culture, blood (Routine X 2) w Reflex to ID Panel  Status: None   Collection Time: 09/18/22  3:33 PM   Specimen: BLOOD LEFT HAND  Result Value Ref Range Status   Specimen Description   Final    BLOOD LEFT HAND Performed at [facility] [address], [place], [place] [address]    Special Requests   Final    AEROBIC BOTTLE ONLY Blood Culture adequate volume Performed at [facility], [address], [place], [place] [address]    Culture   Final    NO GROWTH 5 DAYS Performed at [facility], [address], [place], [place] [address]    Report Status 09/23/2022 FINAL  Final  Culture, blood (Routine X 2) w Reflex to ID Panel     Status: None   Collection Time: 09/18/22  3:43 PM   Specimen: BLOOD LEFT HAND  Result Value Ref Range  Status   Specimen Description   Final    BLOOD LEFT HAND Performed at [facility], [address] [place], [place] [address]    Special Requests   Final    AEROBIC BOTTLE ONLY Blood Culture adequate volume Performed at [facility], [address] [place], [place] [address]    Culture   Final    NO GROWTH 5 DAYS Performed at [facility], [address] [place], [place] [address]    Report Status 09/23/2022 FINAL  Final     Time coordinating discharge: Over 30 minutes  SIGNED:   Alvira Philips [place], DO  [facility] 09/24/2022, 2:29 PM

## 2022-09-24 NOTE — Progress Notes (Signed)
Mobility Specialist - Progress Note   09/24/22 1320  Mobility  Activity Ambulated with assistance in hallway  Level of Assistance Contact guard assist, steadying assist  Assistive Device Front wheel walker  Distance Ambulated (ft) 120 ft  Range of Motion/Exercises Active  Activity Response Tolerated well  Mobility Referral Yes  $Mobility charge 1 Mobility  Mobility Specialist Start Time (ACUTE ONLY) 1305  Mobility Specialist Stop Time (ACUTE ONLY) 1315  Mobility Specialist Time Calculation (min) (ACUTE ONLY) 10 min   Pt received in chair and agreed to mobility.   Nurse requested Mobility Specialist to perform oxygen saturation test with pt which includes removing pt from oxygen both at rest and while ambulating.  Below are the results from that testing.     Patient Saturations on Room Air at Rest = spO2 93%  Patient Saturations on Room Air while Ambulating = sp02 92% .    Patient Saturations on 3 Liters of oxygen while Ambulating = sp02 95%  At end of testing pt left in room on 3  Liters of oxygen.  Reported results to nurse.   Pt tends to desat during coughing spell, but returned to chair with all needs met. Family in room.  Marilynne Halsted Mobility Specialist

## 2022-09-26 ENCOUNTER — Telehealth: Payer: Self-pay

## 2022-09-26 MED FILL — Fentanyl Citrate Preservative Free (PF) Inj 100 MCG/2ML: INTRAMUSCULAR | Qty: 2 | Status: AC

## 2022-09-26 NOTE — Transitions of Care (Post Inpatient/ED Visit) (Signed)
09/26/2022  Name: Kayla [name] MRN: [id number] DOB: 12-Oct-1950  Today's TOC FU Call Status: Today's TOC FU Call Status:: Successful TOC FU Call Competed TOC FU Call Complete Date: 09/26/22  Transition Care Management Follow-up Telephone Call Date of Discharge: 09/24/22 Discharge Facility: [facility]) Type of Discharge: Inpatient Admission Primary Inpatient Discharge Diagnosis:: "HCAP" How have you been since you were released from the hospital?: Same (Pt voices she is doing okay-still having coughing spells-noticed that 02 sats drops to low 90s-80s with spells-taking abx & resp meds to manage sxs. She has some "feet numbness" at times-not painful-will discuss with MD at f/u appt) Any questions or concerns?: No  Items Reviewed: Did you receive and understand the discharge instructions provided?: Yes Medications obtained,verified, and reconciled?: Yes (Medications Reviewed) Any new allergies since your discharge?: No Dietary orders reviewed?: Yes Type of Diet Ordered:: low salt/heart healthy Do you have support at home?: Yes People in Home: spouse Name of Support/Comfort Primary Source: Homero Fellers  Medications Reviewed Today: Medications Reviewed Today     Reviewed by Charlyn Minerva, RN (Registered Nurse) on 09/26/22 at 1133  Med List Status: <None>   Medication Order Taking? Sig Documenting Provider Last Dose Status Informant  albuterol (PROVENTIL) (2.5 MG/3ML) 0.083% nebulizer solution [id number] Yes Take 3 mLs (2.5 mg total) by nebulization every 4 (four) hours as needed for wheezing or shortness of breath. [place], Alvira Philips, DO Taking Active   albuterol (VENTOLIN HFA) 108 (90 Base) MCG/ACT inhaler [id number] Yes INHALE 2 PUFFS INTO LUNGS EVERY 6 HOURS AS NEEDED.  Patient taking differently: Inhale 1 puff into the lungs every 6 (six) hours as needed for shortness of breath.   Kalman Shan, MD Taking Active Self  amoxicillin (AMOXIL) 500 MG capsule [id number] Yes  Take 2 capsules (1,000 mg total) by mouth 3 (three) times daily for 6 days. Daiva Eves, Lisette Grinder, MD Taking Active   calcium carbonate (OS-CAL - DOSED IN MG OF ELEMENTAL CALCIUM) 1250 (500 Ca) MG tablet [id number] Yes Take 1 tablet (1,250 mg total) by mouth daily with breakfast. Arnetha Courser, MD Taking Active Self  Coenzyme Q10 (VITALINE COQ10) 300 MG WAFR [id number] Yes Take 300 mg by mouth in the morning. [provider] Taking Active Self  dexlansoprazole (DEXILANT) 60 MG capsule [id number] Yes TAKE 1 CAPSULE BY MOUTH  ONCE DAILY WITH BREAKFAST  Patient taking differently: Take 60 mg by mouth daily. TAKE 1 CAPSULE BY MOUTH  ONCE DAILY WITH BREAKFAST   [name], MD Taking Active Self  docusate sodium (COLACE) 100 MG capsule [id number] Yes Take 1 capsule (100 mg total) by mouth in the morning and at bedtime. Glade Lloyd, MD Taking Active Self  famotidine (PEPCID) 20 MG tablet [id number] Yes One after supper  Patient taking differently: Take 20 mg by mouth at bedtime. One after supper   [name], MD Taking Active Self  Ferric Maltol (ACCRUFER) 30 MG CAPS [id number] Yes Take 1 capsule (30 mg total) by mouth in the morning and at bedtime.  Patient taking differently: Take 30 mg by mouth in the morning and at bedtime.   [name], MD Taking Active Self  FLUoxetine (PROZAC) 40 MG capsule [id number] Yes Take 1 capsule (40 mg total) by mouth daily. [name], MD Taking Active Self  Fluticasone-Umeclidin-[facility] ELLIPTA) 100-62.5-25 MCG/ACT AEPB [id number] Yes Inhale 1 puff into the lungs daily. [name], MD Taking Active Self  guaiFENesin (MUCINEX) 600 MG 12 hr tablet [id number]  Yes Take 1 tablet (600 mg total) by mouth 2 (two) times daily.  Patient taking differently: Take 600 mg by mouth 2 (two) times daily as needed for cough or to loosen phlegm.   Glade Lloyd, MD Taking Active Self  levalbuterol Pauline Aus) nebulizer solution 0.63 mg [id number]   Bevelyn Ngo, NP  Active   levothyroxine (SYNTHROID) 100 MCG tablet [id number] Yes TAKE 1 TABLET BY MOUTH ONCE  DAILY BEFORE BREAKFAST  Patient taking differently: Take 100 mcg by mouth daily before breakfast.   [name], MD Taking Active Self  methenamine (HIPREX) 1 g tablet [id number] Yes Take 1 tablet (1 g total) by mouth 2 (two) times daily with a meal. [name], MD Taking Active Self  mirabegron ER (MYRBETRIQ) 50 MG TB24 tablet [id number] Yes Take 50 mg by mouth daily. [provider] Taking Active Self  mirtazapine (REMERON) 15 MG tablet [id number] Yes Take 1 tablet (15 mg total) by mouth at bedtime. [name], MD Taking Active Self  Multiple Vitamin (MULTIVITAMIN WITH MINERALS) TABS tablet [id number] Yes Take 1 tablet by mouth in the morning. [provider] Taking Active Self  Probiotic Product (PROBIOTIC PO) [id number] Yes Take 1 capsule by mouth daily. [provider] Taking Active Self  Respiratory Therapy Supplies (FLUTTER) DEVI [id number] Yes Use as directed [name], MD Taking Active Self  sodium chloride HYPERTONIC 3 % nebulizer solution [id number] Yes Take by nebulization 2 (two) times daily as needed for cough. Dx J44.9  Patient taking differently: Take 4 mLs by nebulization 2 (two) times daily as needed for cough. Dx J88.4   Kalman Shan, MD Taking Active Self  tamsulosin (FLOMAX) 0.4 MG CAPS capsule [id number] Yes Take 0.4 mg by mouth daily. [provider] Taking Active Self           Med Note Jomarie Longs, REGEENA   Tue Sep 16, 2022  4:22 PM) Dose change  temazepam (RESTORIL) 15 MG capsule [id number] Yes Take 1 capsule (15 mg total) by mouth at bedtime. [name], MD Taking Active Self  torsemide (DEMADEX) 10 MG tablet [id number] Yes Take 1 tablet (10 mg total) by mouth daily. [name], MD Taking Active Self  trimethoprim (TRIMPEX) 100 MG tablet [id number] Yes Take 100 mg by mouth daily. [provider] Taking  Active Self            Home Care and Equipment/Supplies: Were Home Health Services Ordered?: No Any new equipment or medical supplies ordered?: No  Functional Questionnaire: Do you need assistance with bathing/showering or dressing?: No Do you need assistance with meal preparation?: No Do you need assistance with eating?: No Do you have difficulty maintaining continence: No Do you need assistance with getting out of bed/getting out of a chair/moving?: No Do you have difficulty managing or taking your medications?: No  Follow up appointments reviewed: PCP Follow-up appointment confirmed?: Yes Date of PCP follow-up appointment?: 09/29/22 Follow-up Provider: Dr. Yetta Barre Specialist [facility] Follow-up appointment confirmed?: Yes Date of Specialist follow-up appointment?: 09/30/22 Follow-Up Specialty Provider:: Dr. Sharyn Lull Do you need transportation to your follow-up appointment?: No (pt confirms spouse able to get her to appts) Do you understand care options if your condition(s) worsen?: Yes-patient verbalized understanding  SDOH Interventions Today    Flowsheet Row Most Recent Value  SDOH Interventions   Food Insecurity Interventions Intervention Not Indicated  Transportation Interventions Intervention Not Indicated      TOC Interventions Today    Flowsheet Row Most  Recent Value  TOC Interventions   TOC Interventions Discussed/Reviewed TOC Interventions Discussed, Arranged PCP follow up within 7 days/Care Guide scheduled  [care guide changed PCP appt for 09/29/22 visit type to hosp f/u]      Interventions Today    Flowsheet Row Most Recent Value  Chronic Disease   Chronic disease during today's visit Chronic Obstructive Pulmonary Disease (COPD)  General Interventions   General Interventions Discussed/Reviewed General Interventions Discussed, Doctor Visits  Doctor Visits Discussed/Reviewed Doctor Visits Discussed, PCP, Specialist  PCP/Specialist Visits Compliance with  follow-up visit  Education Interventions   Education Provided Provided Education  Provided Verbal Education On Nutrition, Medication, When to see the doctor, Other  [resp sx mgmt]  Nutrition Interventions   Nutrition Discussed/Reviewed Nutrition Discussed, Fluid intake, Adding fruits and vegetables, Increasing proteins, Decreasing salt, Decreasing fats  Pharmacy Interventions   Pharmacy Dicussed/Reviewed Pharmacy Topics Discussed, Medications and their functions  Safety Interventions   Safety Discussed/Reviewed Safety Discussed, Home Safety  Home Safety Assistive Devices  [pt has walker in the home and uses it as needed]      Antionette Fairy, [facility] Care Management Care Management Community Coordinator Direct Phone: [phone] Toll Free: [phone] Fax: [phone]

## 2022-09-29 ENCOUNTER — Encounter: Payer: Self-pay | Admitting: Internal Medicine

## 2022-09-29 ENCOUNTER — Ambulatory Visit (INDEPENDENT_AMBULATORY_CARE_PROVIDER_SITE_OTHER): Payer: Medicare Other | Admitting: Internal Medicine

## 2022-09-29 VITALS — BP 124/76 | HR 87 | Temp 98.1°F | Ht 63.0 in | Wt 92.0 lb

## 2022-09-29 DIAGNOSIS — D508 Other iron deficiency anemias: Secondary | ICD-10-CM | POA: Diagnosis not present

## 2022-09-29 DIAGNOSIS — J45991 Cough variant asthma: Secondary | ICD-10-CM

## 2022-09-29 DIAGNOSIS — J9611 Chronic respiratory failure with hypoxia: Secondary | ICD-10-CM | POA: Diagnosis not present

## 2022-09-29 DIAGNOSIS — J449 Chronic obstructive pulmonary disease, unspecified: Secondary | ICD-10-CM

## 2022-09-29 NOTE — Progress Notes (Signed)
Subjective:  Patient ID: Kayla [name], female    DOB: 19-Jan-1951  Age: 72 y.o. MRN: [id number]  CC: COPD and Anemia   HPI Kayla [name] presents for f/up ----  PCP: Kayla Grandchild, MD   Admit date: 09/16/2022 Discharge date: 09/24/2022   Admitted From: Home Disposition: Home   Recommendations for Outpatient Follow-up:  Follow up with PCP in 1-2 weeks Continue amoxicillin to complete antibiotic course for Streptococcus pneumonia bacteremia/pneumonia   Home Health: No Equipment/Devices: None   Discharge Condition: Stable CODE STATUS: Full code Diet recommendation: Regular diet   History of present illness:   Kayla [name] is a 72 y.o. female with medical history significant for multiple sclerosis, bilateral vocal cord immobility, immunodeficiency on IVIG, hypothyroidism, GERD, bronchiectasis, asthma, chronic cough recently admitted to [facility] from 6/9 to 6/13 for aspiration pneumonia, right lower lobe multifocal mucous plugging, rhinovirus positive and also found to have strep pneumo bacteremia.  She was treated in the hospital, weaned to room air, and discharged home to complete 10 more days of p.o. Ceftin.  Patient states she did not really improve after leaving the hospital.  She was having some side effects, so cefdinir was stopped on day 8 by her PCP.  She went to her outpatient pulmonology follow-up today, was noted to have sats of 84%, EMS was called, given her bronchodilator and brought to the emergency department.    Hospital course:   Acute hypoxic respiratory failure  Community Acquired Pneumonia Recurrent vs Nonresolving Pneumonia Rhinovirus Infection  Patient presenting to the ED after being found hypoxic during her outpatient pulmonology visit with SpO2 84%.  Repeat respiratory viral panel with continued positivity for rhinovirus.  Chest x-ray with bilateral lower lobe hazy lung opacities, right worse than left suggestive of continued pneumonia.  Blood  cultures positive for Streptococcus pneumonia.  Pulmonology and infectious disease followed during hospital course.  Initially started on empiric antibiotics with vancomycin and cefepime, de-escalated to ceftriaxone.  Weaned from supplemental oxygen, no desaturations lower than 92% on ambulation.  Infectious disease recommended transition to amoxicillin on discharge to complete a 14-day total course.     Streptococcus pneumoniae Bacteremia  Recent strep pneumo bacteremia, concerning for recurrent or inadequately treated bacteremia. Underwent TEE on 09/22/2022 with no vegetations identified.   Initially started on vancomycin and cefepime, de-escalated to ceftriaxone and will discharge on amoxicillin per infectious disease recommendations.    Small PFO Noted on transesophageal echocardiogram. Follow outpatient   Hyponatremia-resolved   Depression Prozac, remeron   GERD Continue PPI.   Hypothyroidism Continue levothyroxine 100 mcg p.o. daily   Discussed the use of AI scribe software for clinical note transcription with the patient, who gave verbal consent to proceed.  History of Present Illness   The patient, with a history of multiple sclerosis (MS) diagnosed in 1996, was admitted for persistent bacteremia. She reports a history of recurrent pneumonia, which she believes has worsened since discontinuing weekly self-infusions of IVIG in March due to a pelvic fracture. The patient also reports a significant weight loss, dropping from 94 to 92 pounds over the past two weeks despite attempts to increase food intake. She expresses frustration with her lack of appetite and the ongoing weight loss.  [name] respiratory status has been a concern, with oxygen saturation levels dropping to 75 at home and 83 at the clinic. She was discharged from the hospital without oxygen, which has been a point of concern. The patient also reports a loss  of sensation in her feet, describing it as if she has  weights on her ankles. This, combined with the weight loss, raises concerns about the risk of falls.  The patient has been on antibiotics, specifically a stronger form of Amoxicillin, for her persistent bacteremia. She also takes an iron supplement. The patient has been experiencing a constant cough for the past two years, which she believes is contributing to her weight loss due to the increased caloric expenditure. She also reports a gag reflex, which further complicates her ability to eat and maintain weight.  The patient has been under the care of multiple specialists, including an immunologist, an MS neurologist, and a palliative care team. She is scheduled to see a new MS neurologist in [place] and is hoping to restart her IVIG infusions, which she believes kept her from getting sick in the past. Despite her ongoing health challenges, the patient expresses a desire to continue fighting and improve her health.       Outpatient Medications Prior to Visit  Medication Sig Dispense Refill   albuterol (PROVENTIL) (2.5 MG/3ML) 0.083% nebulizer solution Take 3 mLs (2.5 mg total) by nebulization every 4 (four) hours as needed for wheezing or shortness of breath. 75 mL 2   amoxicillin (AMOXIL) 500 MG capsule Take 2 capsules (1,000 mg total) by mouth 3 (three) times daily for 6 days. 36 capsule 0   calcium carbonate (OS-CAL - DOSED IN MG OF ELEMENTAL CALCIUM) 1250 (500 Ca) MG tablet Take 1 tablet (1,250 mg total) by mouth daily with breakfast.     Coenzyme Q10 (VITALINE COQ10) 300 MG WAFR Take 300 mg by mouth in the morning.     dexlansoprazole (DEXILANT) 60 MG capsule TAKE 1 CAPSULE BY MOUTH  ONCE DAILY WITH BREAKFAST (Patient taking differently: Take 60 mg by mouth daily. TAKE 1 CAPSULE BY MOUTH  ONCE DAILY WITH BREAKFAST) 90 capsule 0   docusate sodium (COLACE) 100 MG capsule Take 1 capsule (100 mg total) by mouth in the morning and at bedtime.     famotidine (PEPCID) 20 MG tablet One after supper  (Patient taking differently: Take 20 mg by mouth at bedtime. One after supper) 30 tablet 11   Ferric Maltol (ACCRUFER) 30 MG CAPS Take 1 capsule (30 mg total) by mouth in the morning and at bedtime. (Patient taking differently: Take 30 mg by mouth in the morning and at bedtime.) 180 capsule 0   FLUoxetine (PROZAC) 40 MG capsule Take 1 capsule (40 mg total) by mouth daily. 90 capsule 1   Fluticasone-Umeclidin-Vilant (TRELEGY ELLIPTA) 100-62.5-25 MCG/ACT AEPB Inhale 1 puff into the lungs daily. 120 each 1   guaiFENesin (MUCINEX) 600 MG 12 hr tablet Take 1 tablet (600 mg total) by mouth 2 (two) times daily. (Patient taking differently: Take 600 mg by mouth 2 (two) times daily as needed for cough or to loosen phlegm.) 30 tablet 0   levothyroxine (SYNTHROID) 100 MCG tablet TAKE 1 TABLET BY MOUTH ONCE  DAILY BEFORE BREAKFAST (Patient taking differently: Take 100 mcg by mouth daily before breakfast.) 90 tablet 3   methenamine (HIPREX) 1 g tablet Take 1 tablet (1 g total) by mouth 2 (two) times daily with a meal. 180 tablet 0   mirabegron ER (MYRBETRIQ) 50 MG TB24 tablet Take 50 mg by mouth daily.     mirtazapine (REMERON) 15 MG tablet Take 1 tablet (15 mg total) by mouth at bedtime. 90 tablet 1   Multiple Vitamin (MULTIVITAMIN WITH MINERALS) TABS tablet Take  1 tablet by mouth in the morning.     Probiotic Product (PROBIOTIC PO) Take 1 capsule by mouth daily.     Respiratory Therapy Supplies (FLUTTER) DEVI Use as directed 1 each 0   sodium chloride HYPERTONIC 3 % nebulizer solution Take by nebulization 2 (two) times daily as needed for cough. Dx J44.9 (Patient taking differently: Take 4 mLs by nebulization 2 (two) times daily as needed for cough. Dx J44.9) 750 mL 1   tamsulosin (FLOMAX) 0.4 MG CAPS capsule Take 0.4 mg by mouth daily.     temazepam (RESTORIL) 15 MG capsule Take 1 capsule (15 mg total) by mouth at bedtime. 90 capsule 0   torsemide (DEMADEX) 10 MG tablet Take 1 tablet (10 mg total) by mouth  daily. 90 tablet 0   trimethoprim (TRIMPEX) 100 MG tablet Take 100 mg by mouth daily.     albuterol (VENTOLIN HFA) 108 (90 Base) MCG/ACT inhaler INHALE 2 PUFFS INTO LUNGS EVERY 6 HOURS AS NEEDED. (Patient taking differently: Inhale 1 puff into the lungs every 6 (six) hours as needed for shortness of breath.) 18 g 1   Facility-Administered Medications Prior to Visit  Medication Dose Route Frequency Provider Last Rate Last Admin   levalbuterol (XOPENEX) nebulizer solution 0.63 mg  0.63 mg Nebulization Once Bevelyn Ngo, NP        ROS Review of Systems  Constitutional:  Positive for activity change, appetite change, fatigue and unexpected weight change. Negative for diaphoresis.  Eyes: Negative.   Respiratory:  Positive for cough, shortness of breath, wheezing and stridor. Negative for choking and chest tightness.   Cardiovascular:  Negative for chest pain, palpitations and leg swelling.  Gastrointestinal:  Negative for abdominal pain, constipation, diarrhea, nausea and vomiting.  Genitourinary: Negative.  Negative for difficulty urinating and dysuria.  Musculoskeletal:  Positive for gait problem. Negative for back pain.  Skin: Negative.   Neurological:  Positive for weakness and numbness. Negative for dizziness and speech difficulty.  Hematological:  Negative for adenopathy. Does not bruise/bleed easily.  Psychiatric/Behavioral:  Positive for dysphoric mood. Negative for agitation, sleep disturbance and suicidal ideas. The patient is nervous/anxious.     Objective:  BP 124/76 (BP Location: Right Arm, Patient Position: Sitting, Cuff Size: Large)   Pulse 87   Temp 98.1 F (36.7 C) (Oral)   Ht 5\' 3"  (1.6 m)   Wt 92 lb (41.7 kg)   SpO2 90%   BMI 16.30 kg/m   BP Readings from Last 3 Encounters:  09/29/22 124/76  09/24/22 (!) 110/59  09/16/22 (!) 90/58    Wt Readings from Last 3 Encounters:  09/29/22 92 lb (41.7 kg)  09/24/22 102 lb 11.8 oz (46.6 kg)  09/16/22 94 lb (42.6 kg)     Physical Exam Vitals reviewed.  Constitutional:      General: She is not in acute distress.    Appearance: She is cachectic. She is ill-appearing. She is not toxic-appearing or diaphoretic.  HENT:     Mouth/Throat:     Mouth: Mucous membranes are dry.  Eyes:     General: No scleral icterus. Cardiovascular:     Rate and Rhythm: Normal rate and regular rhythm.     Heart sounds: No murmur heard.    No gallop.  Pulmonary:     Effort: Accessory muscle usage and prolonged expiration present. No tachypnea or respiratory distress.     Breath sounds: Stridor present. No decreased air movement. No wheezing, rhonchi or rales.  Abdominal:  General: Abdomen is flat.     Palpations: There is no mass.     Tenderness: There is no abdominal tenderness. There is no guarding.     Hernia: No hernia is present.  Musculoskeletal:        General: No swelling. Normal range of motion.     Cervical back: Neck supple.     Right lower leg: No edema.     Left lower leg: No edema.  Lymphadenopathy:     Cervical: No cervical adenopathy.  Neurological:     Mental Status: She is alert. Mental status is at baseline.  Psychiatric:        Mood and Affect: Affect is tearful.     Lab Results  Component Value Date   WBC 8.3 09/24/2022   HGB 8.8 (L) 09/24/2022   HCT 29.1 (L) 09/24/2022   PLT 619 (H) 09/24/2022   GLUCOSE 98 09/24/2022   CHOL 169 01/14/2022   TRIG 157.0 (H) 01/14/2022   HDL 41.50 01/14/2022   LDLDIRECT 116.1 06/25/2010   LDLCALC 96 01/14/2022   ALT 16 09/24/2022   AST 16 09/24/2022   NA 136 09/24/2022   K 4.0 09/24/2022   CL 98 09/24/2022   CREATININE 0.58 09/24/2022   BUN 10 09/24/2022   CO2 29 09/24/2022   TSH 2.624 08/31/2022   INR 1.1 08/31/2022   HGBA1C 5.6 02/25/2013    ECHOCARDIOGRAM COMPLETE  Result Date: 09/17/2022    ECHOCARDIOGRAM REPORT   Patient Name:   Kayla [name] Date of Exam: 09/17/2022 Medical Rec #:  [id number]       Height:       63.0 in Accession  #:    [id number]      Weight:       98.8 lb Date of Birth:  1950-08-01        BSA:          1.433 m Patient Age:    72 years        BP:           97/78 mmHg Patient Gender: F               HR:           73 bpm. Exam Location:  Inpatient Procedure: 2D Echo, Cardiac Doppler and Color Doppler Indications:    Bacteremia  History:        Patient has prior history of Echocardiogram examinations, most                 recent 01/28/2022. COPD and MS, hx cancer,                 Signs/Symptoms:Shortness of Breath and Bacteremia; Risk                 Factors:Sleep Apnea.  Sonographer:    Wallie Char Referring Phys: [phone] A CALDWELL POWELL JR  Sonographer Comments: Image acquisition challenging due to COPD and Challenging image acquisition due to pt coughing throughout entire exam. IMPRESSIONS  1. Left ventricular ejection fraction, by estimation, is 60 to 65%. Left ventricular ejection fraction by 2D MOD biplane is 63.1 %. The left ventricle has normal function. The left ventricle has no regional wall motion abnormalities. Left ventricular diastolic parameters are consistent with Grade I diastolic dysfunction (impaired relaxation).  2. Right ventricular systolic function is normal. The right ventricular size is normal. There is normal pulmonary artery systolic pressure. The estimated right ventricular systolic pressure is 33.0 mmHg.  3. The mitral valve is grossly normal. Trivial mitral valve regurgitation. No evidence of mitral stenosis.  4. The aortic valve is tricuspid. Aortic valve regurgitation is not visualized. No aortic stenosis is present.  5. The inferior vena cava is normal in size with greater than 50% respiratory variability, suggesting right atrial pressure of 3 mmHg. Conclusion(s)/Recommendation(s): No evidence of valvular vegetations on this transthoracic echocardiogram. Consider a transesophageal echocardiogram to exclude infective endocarditis if clinically indicated. FINDINGS  Left Ventricle: Left  ventricular ejection fraction, by estimation, is 60 to 65%. Left ventricular ejection fraction by 2D MOD biplane is 63.1 %. The left ventricle has normal function. The left ventricle has no regional wall motion abnormalities. The left ventricular internal cavity size was normal in size. There is no left ventricular hypertrophy. Left ventricular diastolic parameters are consistent with Grade I diastolic dysfunction (impaired relaxation). Right Ventricle: The right ventricular size is normal. No increase in right ventricular wall thickness. Right ventricular systolic function is normal. There is normal pulmonary artery systolic pressure. The tricuspid regurgitant velocity is 2.74 m/s, and  with an assumed right atrial pressure of 3 mmHg, the estimated right ventricular systolic pressure is 33.0 mmHg. Left Atrium: Left atrial size was normal in size. Right Atrium: Right atrial size was normal in size. Pericardium: There is no evidence of pericardial effusion. Mitral Valve: The mitral valve is grossly normal. Trivial mitral valve regurgitation. No evidence of mitral valve stenosis. MV peak gradient, 4.7 mmHg. The mean mitral valve gradient is 2.0 mmHg. Tricuspid Valve: The tricuspid valve is grossly normal. Tricuspid valve regurgitation is mild . No evidence of tricuspid stenosis. Aortic Valve: The aortic valve is tricuspid. Aortic valve regurgitation is not visualized. No aortic stenosis is present. Aortic valve mean gradient measures 5.0 mmHg. Aortic valve peak gradient measures 8.2 mmHg. Aortic valve area, by VTI measures 1.81 cm. Pulmonic Valve: The pulmonic valve was grossly normal. Pulmonic valve regurgitation is not visualized. No evidence of pulmonic stenosis. Aorta: The aortic root and ascending aorta are structurally normal, with no evidence of dilitation. Venous: The inferior vena cava is normal in size with greater than 50% respiratory variability, suggesting right atrial pressure of 3 mmHg. IAS/Shunts: The  atrial septum is grossly normal.  LEFT VENTRICLE PLAX 2D                        Biplane EF (MOD) LVIDd:         3.80 cm         LV Biplane EF:   Left LVIDs:         2.70 cm                          ventricular LV PW:         0.80 cm                          ejection LV IVS:        0.80 cm                          fraction by LVOT diam:     1.70 cm                          2D MOD LV SV:         55  biplane is LV SV Index:   38                               63.1 %. LVOT Area:     2.27 cm                                Diastology                                LV e' medial:    5.58 cm/s LV Volumes (MOD)               LV E/e' medial:  13.9 LV vol d, MOD    61.1 ml       LV e' lateral:   8.64 cm/s A2C:                           LV E/e' lateral: 9.0 LV vol d, MOD    54.0 ml A4C: LV vol s, MOD    23.4 ml A2C: LV vol s, MOD    20.2 ml A4C: LV SV MOD A2C:   37.7 ml LV SV MOD A4C:   54.0 ml LV SV MOD BP:    37.5 ml RIGHT VENTRICLE             IVC RV Basal diam:  3.20 cm     IVC diam: 2.00 cm RV S prime:     13.10 cm/s TAPSE (M-mode): 2.3 cm LEFT ATRIUM             Index        RIGHT ATRIUM           Index LA diam:        2.60 cm 1.81 cm/m   RA Area:     11.70 cm LA Vol (A2C):   24.0 ml 16.75 ml/m  RA Volume:   26.70 ml  18.64 ml/m LA Vol (A4C):   19.3 ml 13.47 ml/m LA Biplane Vol: 21.8 ml 15.22 ml/m  AORTIC VALVE AV Area (Vmax):    1.78 cm AV Area (Vmean):   1.70 cm AV Area (VTI):     1.81 cm AV Vmax:           143.50 cm/s AV Vmean:          106.100 cm/s AV VTI:            0.304 m AV Peak Grad:      8.2 mmHg AV Mean Grad:      5.0 mmHg LVOT Vmax:         112.50 cm/s LVOT Vmean:        79.450 cm/s LVOT VTI:          0.242 m LVOT/AV VTI ratio: 0.80  AORTA Ao Root diam: 2.90 cm Ao Asc diam:  3.20 cm MITRAL VALVE                TRICUSPID VALVE MV Area (PHT): 3.01 cm     TR Peak grad:   30.0 mmHg MV Area VTI:   2.12 cm     TR Vmax:        274.00 cm/s MV Peak grad:  4.7 mmHg MV Mean grad:   2.0 mmHg  SHUNTS MV Vmax:       1.08 m/s     Systemic VTI:  0.24 m MV Vmean:      59.8 cm/s    Systemic Diam: 1.70 cm MV Decel Time: 252 msec MV E velocity: 77.40 cm/s MV A velocity: 102.00 cm/s MV E/A ratio:  0.76 Lennie Odor MD Electronically signed by Lennie Odor MD Signature Date/Time: 09/17/2022/4:16:58 PM    Final    DG Chest Port 1 View  Result Date: 09/16/2022 CLINICAL DATA:  Cough and dyspnea. EXAM: PORTABLE CHEST 1 VIEW COMPARISON:  Chest radiograph dated September 04, 2022 FINDINGS: The heart size and mediastinal contours are within normal limits. Bilateral lower lobe hazy lung opacities, right worse than the left with right basilar atelectasis, suggesting ongoing infectious/inflammatory process. Small bilateral pleural effusion. The visualized skeletal structures are unremarkable. IMPRESSION: 1. Bilateral lower lobe hazy lung opacities, right worse than the left with right basilar atelectasis, suggesting ongoing infectious/inflammatory process. 2. Small bilateral pleural effusion. Electronically Signed   By: Larose Hires D.O.   On: 09/16/2022 14:35    Assessment & Plan:   Chronic obstructive pulmonary disease, unspecified COPD type (HCC) -     Ambulatory referral to Home Health  Cough variant asthma vs vcd  related to MS  -     Ambulatory referral to Home Health  Chronic respiratory failure with hypoxia (HCC)- Stable on oxygen. -     Ambulatory referral to Home Health  Iron deficiency anemia secondary to inadequate dietary iron intake     Follow-up: No follow-ups on file.  Sanda Linger, MD

## 2022-09-30 ENCOUNTER — Other Ambulatory Visit: Payer: Self-pay

## 2022-09-30 ENCOUNTER — Telehealth: Payer: Self-pay | Admitting: *Deleted

## 2022-09-30 ENCOUNTER — Encounter: Payer: Self-pay | Admitting: Allergy and Immunology

## 2022-09-30 ENCOUNTER — Ambulatory Visit (INDEPENDENT_AMBULATORY_CARE_PROVIDER_SITE_OTHER): Payer: Medicare Other | Admitting: Allergy and Immunology

## 2022-09-30 VITALS — BP 110/62 | HR 86 | Temp 98.2°F | Resp 18 | Ht 63.0 in | Wt 94.1 lb

## 2022-09-30 DIAGNOSIS — J479 Bronchiectasis, uncomplicated: Secondary | ICD-10-CM

## 2022-09-30 DIAGNOSIS — J3802 Paralysis of vocal cords and larynx, bilateral: Secondary | ICD-10-CM | POA: Diagnosis not present

## 2022-09-30 DIAGNOSIS — K219 Gastro-esophageal reflux disease without esophagitis: Secondary | ICD-10-CM | POA: Diagnosis not present

## 2022-09-30 DIAGNOSIS — J9611 Chronic respiratory failure with hypoxia: Secondary | ICD-10-CM

## 2022-09-30 DIAGNOSIS — M810 Age-related osteoporosis without current pathological fracture: Secondary | ICD-10-CM | POA: Diagnosis not present

## 2022-09-30 DIAGNOSIS — D839 Common variable immunodeficiency, unspecified: Secondary | ICD-10-CM | POA: Diagnosis not present

## 2022-09-30 DIAGNOSIS — J418 Mixed simple and mucopurulent chronic bronchitis: Secondary | ICD-10-CM | POA: Diagnosis not present

## 2022-09-30 MED ORDER — FORMOTEROL FUMARATE 20 MCG/2ML IN NEBU: 20.0000 ug | INHALATION_SOLUTION | Freq: Two times a day (BID) | RESPIRATORY_TRACT | 5 refills | Status: DC

## 2022-09-30 MED ORDER — BUDESONIDE 0.5 MG/2ML IN SUSP: 0.5000 mg | Freq: Two times a day (BID) | RESPIRATORY_TRACT | 5 refills | Status: DC

## 2022-09-30 MED ORDER — YUPELRI 175 MCG/3ML IN SOLN: 175.0000 ug | Freq: Every day | RESPIRATORY_TRACT | 1 refills | Status: DC

## 2022-09-30 NOTE — Telephone Encounter (Signed)
Spoke to patient and got info from the MD and specialty [employer] and called and spoke to [place]. She will send order and I will fax that and note from today to get patient shipped this week . She has been on 7gram weekly and will continue for now

## 2022-09-30 NOTE — Patient Instructions (Addendum)
  1. Blood - IgA/G/M, B cell memory panel  2. Start immunoglobulin infusion this week / Tammy (SQ or IV)  3. Treat inflammation of airway:   A. Budesonide 5 mg - nebulize 2 times per day  B. Formoterol - nebulize 2 times per day  C. Yupelri - nebulize 1 time per day  4. If needed:   A. Albuterol nebulization every 4-6 hours  5. Treat reflux with the following:   A. Continue Dexilant and famotidine   B. Taper off caffeine and chocolate  6. Measurement of oxygen while sleeping???  7. Further evaluation and treatment???  8. Return to clinic in 8 weeks or earlier if problem

## 2022-09-30 NOTE — Telephone Encounter (Signed)
-----   Message from Jessica Priest, MD sent at 09/30/2022  9:22 AM EDT ----- Kayla [name], needs immunoglobulin stat. Was on 35 mls xembify weekly. Either restart SQ or IV this week.

## 2022-09-30 NOTE — Progress Notes (Addendum)
[place] - [place] - [place] - [place] - [place]   Dear Dr. Yetta Barre,  Thank you for referring Kayla [name] to the [facility] [place] on 09/30/2022.   Below is a summation of this patient's evaluation and recommendations.  Thank you for your referral. I will keep you informed about this patient's response to treatment.   If you have any questions please do not hesitate to contact me.   Sincerely,  Jessica Priest, MD Allergy / Immunology [facility]   ______________________________________________________________________    NEW PATIENT NOTE  Referring Provider: [name], MD Primary Provider: [name], MD Date of office visit: 09/30/2022    Subjective:   Chief Complaint:  Kayla [name] (DOB: 1950-08-19) is a 71 y.o. female who presents to the clinic on 09/30/2022 with a chief complaint of IVIG (Wants to get on infusions. ) .     HPI: Onnolee present to this clinic in evaluation of CVID.  Her history dates back to administration of anti-CD20 monoclonal therapy for multiple sclerosis for which she had her last dose administered October 2022.  Because of an issue with recurrent infections and documented hypogammaglobulinemia she was started on subcutaneous immunoglobulin using 35 mL of Xembify every week throughout 2023 and unfortunately because of a logistical issue discontinued this form of therapy in March 2024.  This late spring and early summer she has already had 2 very significant hypoxic near death pulmonary infections / bacteremia.  Her history is complicated by the fact that she has bad bronchiectasis and she has apparent vocal cord paralysis and she has reflux.  She is being treated with Trelegy but on evaluation of her inhalation technique today I do not really think she is inhaling much Trelegy and she uses albuterol twice a day. She is being treated for reflux  and drinks daily caffeine  Past Medical History:  Diagnosis Date   Arthritis    oa   Asthma    Bilateral vocal cord paralysis    Bronchiectasis (HCC)    Cervical cancer (HCC) 2005   Depression    GERD (gastroesophageal reflux disease)    Hiatal hernia    Hypothyroidism    Immunodeficiency (HCC)    Gets IVIG infusions   Insomnia    Multiple sclerosis (HCC) 1996   Osteoporosis    Pneumonia 2024, 2014, 1992   Urine retention     Past Surgical History:  Procedure Laterality Date   ABDOMINAL HYSTERECTOMY  03/25/2003   for cervical cancer, complete   BRONCHIAL BIOPSY  10/26/2020   Procedure: BRONCHIAL BIOPSIES;  Surgeon: Josephine Igo, DO;  [facility]: [facility];  Service: Pulmonary;;   BRONCHIAL BRUSHINGS  10/26/2020   Procedure: BRONCHIAL BRUSHINGS;  Surgeon: Josephine Igo, DO;  [facility]: [facility];  Service: Pulmonary;;   BRONCHIAL WASHINGS  10/26/2020   Procedure: BRONCHIAL WASHINGS;  Surgeon: Josephine Igo, DO;  [facility]: [facility];  Service: Pulmonary;;   BRONCHIAL WASHINGS  08/19/2022   Procedure: BRONCHIAL WASHINGS;  Surgeon: Josephine Igo, DO;  [facility]: [facility];  Service: Cardiopulmonary;;   CATARACT EXTRACTION, BILATERAL Bilateral    lens for cataracts   CONVERSION TO TOTAL HIP Right 09/18/2015   Procedure: CONVERSION OF RIGHT HEMI ARTHROPLASTY TO TOTAL HIP ARTHROPLASTY ACETABULAR REVISON ;  Surgeon: Durene Romans, MD;  Location: [facility];  Service: Orthopedics;  Laterality: Right;   HIP ARTHROPLASTY Right 03/01/2013   Procedure:  ARTHROPLASTY BIPOLAR HIP;  Surgeon: Shelda Pal, MD;  Location: [facility];  Service: Orthopedics;  Laterality: Right;   HIP CLOSED REDUCTION Right 03/12/2013   Procedure: CLOSED REDUCTION HIP;  Surgeon: [name], MD;  Location: [facility];  Service: Orthopedics;  Laterality: Right;   HIP CLOSED REDUCTION Right 03/19/2013   Procedure: CLOSED REDUCTION HIP;  Surgeon: Javier Docker, MD;  Location: [facility];  Service:  Orthopedics;  Laterality: Right;   HIP CLOSED REDUCTION Right 11/10/2015   Procedure: CLOSED MANIPULATION HIP;  Surgeon: Samson Frederic, MD;  Location: [facility];  Service: Orthopedics;  Laterality: Right;   HIP CLOSED REDUCTION Right 06/05/2017   Procedure: CLOSED REDUCTION HIP;  Surgeon: Ranee Gosselin, MD;  Location: [facility];  Service: Orthopedics;  Laterality: Right;   SMALL INTESTINE SURGERY     TEE WITHOUT CARDIOVERSION N/A 09/22/2022   Procedure: TRANSESOPHAGEAL ECHOCARDIOGRAM;  Surgeon: Pricilla Riffle, MD;  Location: [facility]IVE CV LAB;  Service: Cardiovascular;  Laterality: N/A;   TOTAL HIP REVISION Right 06/06/2017   Procedure: TOTAL HIP REVISION;  Surgeon: Ranee Gosselin, MD;  Location: [facility];  Service: Orthopedics;  Laterality: Right;   VESICOVAGINAL FISTULA CLOSURE W/ TAH  03/25/2003   VIDEO BRONCHOSCOPY N/A 10/26/2020   Procedure: VIDEO BRONCHOSCOPY WITH FLUORO;  Surgeon: Josephine Igo, DO;  [facility];  Service: Pulmonary;  Laterality: N/A;   VIDEO BRONCHOSCOPY Left 08/19/2022   Procedure: VIDEO BRONCHOSCOPY WITH FLUORO; WITH BAL;  Surgeon: Josephine Igo, DO;  [facility];  Service: Cardiopulmonary;  Laterality: Left;    Allergies as of 09/30/2022       Reactions   Tramadol    hallucinations    Contrast Media [iodinated Contrast Media] Rash   Iohexol Hives   Morphine And Codeine Other (See Comments)   Reaction:  Hallucinations    Methylprednisolone Other (See Comments)   Reaction:  Decreases pts heart rate    Hydrocodone Itching   Oxycodone-acetaminophen Rash, Other (See Comments)   Reaction:  Hallucinations    Phenytoin Sodium Extended Rash   Zanaflex [tizanidine Hydrochloride] Rash        Medication List    ACCRUFeR 30 MG Caps Generic drug: Ferric Maltol Take 1 capsule (30 mg total) by mouth in the morning and at bedtime.   albuterol (2.5 MG/3ML) 0.083% nebulizer solution Commonly known as: PROVENTIL Take 3 mLs (2.5 mg total) by  nebulization every 4 (four) hours as needed for wheezing or shortness of breath.   amoxicillin 500 MG capsule Commonly known as: AMOXIL Take 2 capsules (1,000 mg total) by mouth 3 (three) times daily for 6 days.   budesonide 0.5 MG/2ML nebulizer solution Commonly known as: Pulmicort Take 2 mLs (0.5 mg total) by nebulization in the morning and at bedtime. Started by: Jessica Priest, MD   calcium carbonate 1250 (500 Ca) MG tablet Commonly known as: OS-CAL - dosed in mg of elemental calcium Take 1 tablet (1,250 mg total) by mouth daily with breakfast.   dexlansoprazole 60 MG capsule Commonly known as: Dexilant TAKE 1 CAPSULE BY MOUTH  ONCE DAILY WITH BREAKFAST   docusate sodium 100 MG capsule Commonly known as: COLACE Take 1 capsule (100 mg total) by mouth in the morning and at bedtime.   famotidine 20 MG tablet Commonly known as: Pepcid One after supper   FLUoxetine 40 MG capsule Commonly known as: PROZAC Take 1 capsule (40 mg total) by mouth daily.   Flutter Devi Use as directed  formoterol 20 MCG/2ML nebulizer solution Commonly known as: PERFOROMIST Take 2 mLs (20 mcg total) by nebulization 2 (two) times daily. Started by: Jessica Priest, MD   guaiFENesin 600 MG 12 hr tablet Commonly known as: MUCINEX Take 1 tablet (600 mg total) by mouth 2 (two) times daily.   levothyroxine 100 MCG tablet Commonly known as: SYNTHROID TAKE 1 TABLET BY MOUTH ONCE  DAILY BEFORE BREAKFAST   methenamine 1 g tablet Commonly known as: HIPREX Take 1 tablet (1 g total) by mouth 2 (two) times daily with a meal.   mirtazapine 15 MG tablet Commonly known as: REMERON Take 1 tablet (15 mg total) by mouth at bedtime.   multivitamin with minerals Tabs tablet Take 1 tablet by mouth in the morning.   Myrbetriq 50 MG Tb24 tablet Generic drug: mirabegron ER Take 50 mg by mouth daily.   PROBIOTIC PO Take 1 capsule by mouth daily.   sodium chloride HYPERTONIC 3 % nebulizer solution Take by  nebulization 2 (two) times daily as needed for cough. Dx J44.9 What changed: how much to take   tamsulosin 0.4 MG Caps capsule Commonly known as: FLOMAX Take 0.4 mg by mouth daily.   temazepam 15 MG capsule Commonly known as: RESTORIL Take 1 capsule (15 mg total) by mouth at bedtime.   torsemide 10 MG tablet Commonly known as: DEMADEX Take 1 tablet (10 mg total) by mouth daily.   Trelegy Ellipta 100-62.5-25 MCG/ACT Aepb Generic drug: Fluticasone-Umeclidin-Vilant Inhale 1 puff into the lungs daily.   trimethoprim 100 MG tablet Commonly known as: TRIMPEX Take 100 mg by mouth daily.   Vitaline CoQ10 300 MG Wafr Generic drug: Coenzyme Q10 Take 300 mg by mouth in the morning.   Yupelri 175 MCG/3ML nebulizer solution Generic drug: revefenacin Take 3 mLs (175 mcg total) by nebulization daily. Started by: Jessica Priest, MD    Review of systems negative except as noted in HPI / PMHx or noted below:  Review of Systems  Constitutional: Negative.   HENT: Negative.    Eyes: Negative.   Respiratory: Negative.    Cardiovascular: Negative.   Gastrointestinal: Negative.   Genitourinary: Negative.   Musculoskeletal: Negative.   Skin: Negative.   Neurological: Negative.   Endo/Heme/Allergies: Negative.   Psychiatric/Behavioral: Negative.      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  Tobacco Use   Smoking status: Never   Smokeless tobacco: Never  Vaping Use   Vaping  Use: Never used  Substance and Sexual Activity   Alcohol use: Not Currently    Alcohol/week: 2.0 standard drinks of alcohol    Types: 1 Glasses of wine, 1 Cans of beer per week    Comment: 2x/week    Drug use: No   Sexual activity: Yes  Other Topics Concern   Not on file  Social History Narrative  Not on file   Social Determinants of Health   Financial Resource Strain: Low Risk  (07/07/2022)   Overall Financial Resource Strain (CARDIA)    Difficulty of Paying Living Expenses: Not very hard  Food Insecurity: No Food Insecurity (09/26/2022)   Hunger Vital Sign    Worried About Running Out of Food in the Last Year: Never true    Ran Out of Food in the Last Year: Never true  Transportation Needs: No Transportation Needs (09/26/2022)   PRAPARE - Administrator, Civil Service (Medical): No    Lack of Transportation (Non-Medical): No  Physical Activity: Inactive (07/07/2022)   Exercise Vital Sign    Days of Exercise per Week: 0 days    Minutes of Exercise per Session: 60 min  Stress: No Stress Concern Present (07/07/2022)   [employer] of Occupational Health - Occupational Stress Questionnaire    Feeling of Stress : Only a little  Social Connections: Socially Integrated (07/07/2022)   Social Connection and Isolation Panel [NHANES]    Frequency of Communication with Friends and Family: Three times a week    Frequency of Social Gatherings with Friends and Family: Once a week    Attends Religious Services: 1 to 4 times per year    Active Member of [employer] or Organizations: Yes    Attends Banker Meetings: 1 to 4 times per year    Marital Status: Married  Catering manager Violence: Not At Risk (09/16/2022)   Humiliation, Afraid, Rape, and Kick questionnaire    Fear of Current or Ex-Partner: No    Emotionally Abused: No    Physically Abused: No    Sexually Abused: No    Environmental and Social history  Lives in a house with a dry environment, dogs located  inside the household, carpet in the bedroom, plastic on the bed, plastic on the pillow, and no smoking ongoing with inside the household.  She is a retired Runner, broadcasting/film/video.  Objective:   Vitals:   09/30/22 0849  BP: 110/62  Pulse: 86  Resp: 18  Temp: 98.2 F (36.8 C)  SpO2: 97%   Height: 5\' 3"  (160 cm) Weight: 94 lb 1.6 oz (42.7 kg)  Physical Exam Constitutional:      Appearance: She is not diaphoretic.     Comments: Stridor, raspy voice, cachectic, wheelchair  HENT:     Head: Normocephalic.     Right Ear: Tympanic membrane, ear canal and external ear normal.     Left Ear: Tympanic membrane, ear canal and external ear normal.     Nose: Nose normal. No mucosal edema or rhinorrhea.     Mouth/Throat:     Pharynx: Uvula midline. No oropharyngeal exudate.  Eyes:     Conjunctiva/sclera: Conjunctivae normal.  Neck:     Thyroid: No thyromegaly.     Trachea: Trachea normal. No tracheal tenderness or tracheal deviation.  Cardiovascular:     Rate and Rhythm: Normal rate and regular rhythm.     Heart sounds: Normal heart sounds, S1 normal and S2 normal. No murmur heard. Pulmonary:     Effort: No respiratory distress.     Breath sounds: Normal breath sounds. No stridor. No wheezing or rales.  Lymphadenopathy:     Head:     Right side of head: No tonsillar adenopathy.     Left side of head: No tonsillar adenopathy.     Cervical: No cervical adenopathy.  Skin:    Findings: No erythema or rash.  Nails: There is no clubbing.  Neurological:     Mental Status: She is alert.     Diagnostics: Allergy skin tests were not performed.   Results of a chest CT angio performed 31 August 2022 identified the following:  Cardiovascular: The heart is normal in size and there is a trace pericardial effusion. There is atherosclerotic calcification of the aorta without evidence of aneurysm. Pulmonary trunk is normal in caliber. No evidence of pulmonary embolism. Evaluation is limited due to  respiratory motion artifact.   Mediastinum/Nodes: A prominent lymph node is present in the prevascular space measuring 9 mm. No hilar or axillary lymphadenopathy. The trachea and esophagus are within normal limits.   Lungs/Pleura: Debris is present in the right lower lobe bronchus. There is bronchial wall thickening in the lower lobes bilaterally with multifocal mucous plugging. Scattered ground-glass opacities are present in the lungs bilaterally with a basilar predominance. Consolidation is noted in the right lower lobe. No effusion or pneumothorax.  Results of an HRCT obtained 24 October 2020 identifies the following:  Cardiovascular: Heart size is normal. There is no significant pericardial fluid, thickening or pericardial calcification. Aortic atherosclerosis. No definite coronary artery calcifications.   Mediastinum/Nodes: No pathologically enlarged mediastinal or hilar lymph nodes. Please note that accurate exclusion of hilar adenopathy is limited on noncontrast CT scans. Esophagus is unremarkable in appearance. No axillary lymphadenopathy.   Lungs/Pleura: When compared to the prior examination, several of the areas of patchy peribronchovascular ground-glass attenuation and nodular airspace consolidation seen on the prior study have resolved or regressed (particularly in the basal segments of the right lower lobe). However, other areas have dramatically progressed or are new. The most significant progression is in the left lower lobe where there is far more frank consolidation than seen on the prior study, along with increasing areas of ground-glass attenuation. Additionally, in the left upper lobe there are completely new areas of ground-glass attenuation and peribronchovascular airspace consolidation. Scattered areas of cylindrical bronchiectasis are again noted. Findings are randomly distributed in the lungs bilaterally and are asymmetric involving the left lung greater  than the right. Inspiratory and expiratory imaging demonstrates some mild air trapping indicative of mild small airways disease. No pleural effusions.  Results of blood tests obtained 24 September 2022 identifies WBC 8.3, absolute eosinophil 300, absolute basophil 100, absolute lymphocyte 2100, hemoglobin 8.8, platelet 619  Results of blood test obtained 04 November 2021 identifies IgE 6 KU/L  Results of a modified barium swallow obtained 15 October 2020 identifies the following:  Pt's swallow function, upon reviewing documentation from 2014 MBS studies, does not appear to have changed dramatically.  She continues to present with solid food residue that sits in the valleculae and pyriform sinuses - it is sensed and cleared with a secondary swallow.  There was intermittent incomplete closure of the epiglottis over the vestibule and some reduced patency of the UES - this may be due in part to the degenerative changes in the cervical vertebrae (documented in 9/21 MR spine) and the resultant potential narrowing of the pharyngeal space.  Pt achieved effective laryngeal closure regardless, and there was no aspiration despite large, sequential thin liquid boluses and mixed solid/liquid consistencies. There was a notable CP bar, which played a part in capturing some residue superior to its surface.  Pt should continue to eat regular solids and drink thin liquids; she is already using f/u sub-swallows to clear pharynx. Potentially pt could have issues with pharyngeal residue spilling into larynx/trachea, but she demonstrated  spontaneous compensatory strategies that helped reduce the residue.  Study was reviewed in real time; pt will f/u with GI.   Results of pulmonary function tests obtained 24 October 2021 identifies TLC 100% predicted, RV 135% predicted, DL/VA 08% predicted.  Assessment and Plan:    1. CVID (common variable immunodeficiency) (HCC)   2. Chronic respiratory failure with hypoxia (HCC)   3. Mixed simple  and mucopurulent chronic bronchitis (HCC)   4. Bronchiectasis without complication (HCC)   5. Bilateral vocal cord paralysis   6. Gastroesophageal reflux disease, unspecified whether esophagitis present   7. Age-related osteoporosis without current pathological fracture     1. Blood - IgA/G/M, B cell memory panel  2. Start immunoglobulin infusion this week / Tammy (SQ or IV)  3. Treat inflammation of airway:   A. Budesonide 5 mg - nebulize 2 times per day  B. Formoterol - nebulize 2 times per day  C. Yupelri - nebulize 1 time per day  4. If needed:   A. Albuterol nebulization every 4-6 hours  5. Treat reflux with the following:   A. Continue Dexilant and famotidine   B. Taper off caffeine and chocolate  6. Measurement of oxygen while sleeping  7. Arrange for respitech vest to remove mucous impaction w/ bronchiectasis  8. Return to clinic in 8 weeks or earlier if problem  Olivea is at risk for a life-threatening bacterial infection and we need to get her immunoglobulin this week whether it be IV or subcu.  We have drawn her blood today to establish her immunoglobulin levels and to look at her B-cell memory phenotype and we will have our Biologics coordinator arrange for immunoglobulin this week.  I am going to treat her with nebulized budesonide and formoterol and Yupelri as there is no way that she is getting the powder from her Trelegy inhaler with good distribution across her lungs and given the fact that she has documented lower airway mucus impaction and bronchiectasis that has failed therapy administered in the past including multiple antibiotics we will start her on a respite tech vest to help remove those mucus impactions and we are going to measure her oxygen at nighttime while sleeping as I suspect she probably developed some hypoxemia with her severe lung condition.  She has reflux and some degree of vocal cord dysfunction which is a bad combination for having her lungs  insulted by acid and I have asked her to remain on Dexilant and famotidine and taper off all caffeine in an attempt to minimize her reflux.  As well, she has osteoporosis and although she may not be on any therapy at this point she will require some treatment for this condition although I would not recommend any type of oral biphosphonate given her reflux disease.  Jessica Priest, MD Allergy / Immunology [facility]

## 2022-09-30 NOTE — Telephone Encounter (Signed)
L/m for patient to contact me to get info from her regarding immunologist she has been seeing and specialty pharmacy she was receiving Xembify from

## 2022-10-01 ENCOUNTER — Encounter: Payer: Self-pay | Admitting: Allergy and Immunology

## 2022-10-01 ENCOUNTER — Other Ambulatory Visit (HOSPITAL_COMMUNITY): Payer: Self-pay

## 2022-10-01 DIAGNOSIS — J449 Chronic obstructive pulmonary disease, unspecified: Secondary | ICD-10-CM | POA: Diagnosis not present

## 2022-10-01 DIAGNOSIS — D849 Immunodeficiency, unspecified: Secondary | ICD-10-CM | POA: Diagnosis not present

## 2022-10-01 DIAGNOSIS — D8489 Other immunodeficiencies: Secondary | ICD-10-CM | POA: Diagnosis not present

## 2022-10-01 DIAGNOSIS — N319 Neuromuscular dysfunction of bladder, unspecified: Secondary | ICD-10-CM | POA: Diagnosis not present

## 2022-10-01 DIAGNOSIS — G8220 Paraplegia, unspecified: Secondary | ICD-10-CM | POA: Diagnosis not present

## 2022-10-01 DIAGNOSIS — G35 Multiple sclerosis: Secondary | ICD-10-CM | POA: Diagnosis not present

## 2022-10-02 ENCOUNTER — Other Ambulatory Visit: Payer: Self-pay | Admitting: *Deleted

## 2022-10-02 ENCOUNTER — Other Ambulatory Visit (HOSPITAL_COMMUNITY): Payer: Self-pay

## 2022-10-02 ENCOUNTER — Telehealth: Payer: Self-pay | Admitting: Allergy and Immunology

## 2022-10-02 ENCOUNTER — Inpatient Hospital Stay: Payer: Medicare Other | Admitting: Nurse Practitioner

## 2022-10-02 MED ORDER — YUPELRI 175 MCG/3ML IN SOLN
175.0000 ug | Freq: Every day | RESPIRATORY_TRACT | 1 refills | Status: DC
  Filled 2022-10-02: qty 90, 30d supply, fill #0

## 2022-10-02 MED ORDER — BUDESONIDE 0.5 MG/2ML IN SUSP
0.5000 mg | Freq: Two times a day (BID) | RESPIRATORY_TRACT | 5 refills | Status: DC
  Filled 2022-10-02 – 2022-10-06 (×2): qty 120, 30d supply, fill #0

## 2022-10-02 MED ORDER — FORMOTEROL FUMARATE 20 MCG/2ML IN NEBU
20.0000 ug | INHALATION_SOLUTION | Freq: Two times a day (BID) | RESPIRATORY_TRACT | 5 refills | Status: DC
  Filled 2022-10-02 – 2022-10-06 (×2): qty 120, 30d supply, fill #0

## 2022-10-02 NOTE — Telephone Encounter (Signed)
Prescriptions have been sent in to requested pharmacy. Called patient and informed, patient verbalized understanding.

## 2022-10-02 NOTE — Telephone Encounter (Signed)
Patient states [facility] was not able to fill  the meds that were sent on 7/09, please send to [facility]

## 2022-10-03 ENCOUNTER — Telehealth: Payer: Self-pay | Admitting: *Deleted

## 2022-10-03 ENCOUNTER — Other Ambulatory Visit (HOSPITAL_COMMUNITY): Payer: Self-pay

## 2022-10-03 DIAGNOSIS — J9611 Chronic respiratory failure with hypoxia: Secondary | ICD-10-CM

## 2022-10-03 DIAGNOSIS — Z8701 Personal history of pneumonia (recurrent): Secondary | ICD-10-CM | POA: Diagnosis not present

## 2022-10-03 DIAGNOSIS — G35 Multiple sclerosis: Secondary | ICD-10-CM | POA: Diagnosis not present

## 2022-10-03 DIAGNOSIS — R634 Abnormal weight loss: Secondary | ICD-10-CM | POA: Diagnosis not present

## 2022-10-03 DIAGNOSIS — Z9981 Dependence on supplemental oxygen: Secondary | ICD-10-CM | POA: Diagnosis not present

## 2022-10-03 NOTE — Telephone Encounter (Signed)
-----   Message from [facility] Lucretia K sent at 10/03/2022  1:46 PM EDT ----- Umn no. We just put the orders in. Call [facility] for the sleep study? ----- Message ----- From: Ma Hillock, CMA Sent: 10/03/2022   1:45 PM EDT To: Ralene Muskrat, CMA  Raelyn Number said that you may have been working on this, did we call [facility] to get the nocturnal oximetry set up? Did we fill out the form for [employer]? ----- Message ----- From: Devoria Glassing, CMA Sent: 10/01/2022   9:50 AM EDT To: [facility]   ----- Message ----- From: Jessica Priest, MD Sent: 10/01/2022   6:55 AM EDT To: Devoria Glassing, CMA  Please order her a respitech vest from [place].   Please order her a Nocturnal Ox study (may already have been ordered)

## 2022-10-03 NOTE — Telephone Encounter (Signed)
Called and spoke with [facility] and provided patient's information for the overnight oximetry. They will call her to get this scheduled. Forms for Vest therapy have been filled out with attached office notes and demographics and have been placed on your desk for you to review and sign.

## 2022-10-04 LAB — MEMORY B CELLS
CD27+/IgD+ (Unswitched): 15.8 % (ref 3.8–52.7)
CD27+/IgD- (Switched): 1 % — ABNORMAL LOW (ref 1.9–30.4)
CD27-/IgD+ (Naive): 82.8 % (ref 24.4–90.6)

## 2022-10-04 LAB — IGG, IGA, IGM
IgA/Immunoglobulin A, Serum: 41 mg/dL — ABNORMAL LOW (ref 87–352)
IgG (Immunoglobin G), Serum: 361 mg/dL — ABNORMAL LOW (ref 586–1602)
IgM (Immunoglobulin M), Srm: 17 mg/dL — ABNORMAL LOW (ref 26–217)

## 2022-10-04 LAB — ACID FAST CULTURE WITH REFLEXED SENSITIVITIES (MYCOBACTERIA)
Acid Fast Culture: NEGATIVE
Acid Fast Culture: NEGATIVE

## 2022-10-04 LAB — HEPATIC FUNCTION PANEL
ALT: 13 IU/L (ref 0–32)
AST: 18 IU/L (ref 0–40)
Albumin: 4.1 g/dL (ref 3.9–4.9)
Alkaline Phosphatase: 93 IU/L (ref 44–121)
Bilirubin Total: <0.2 mg/dL (ref 0.0–1.2)
Bilirubin, Direct: <0.1 mg/dL (ref 0.00–0.40)
Total Protein: 6.2 g/dL (ref 6.0–8.5)

## 2022-10-05 ENCOUNTER — Encounter: Payer: Self-pay | Admitting: Internal Medicine

## 2022-10-05 LAB — ACID FAST CULTURE WITH REFLEXED SENSITIVITIES (MYCOBACTERIA): Acid Fast Culture: NEGATIVE

## 2022-10-06 ENCOUNTER — Other Ambulatory Visit: Payer: Self-pay | Admitting: *Deleted

## 2022-10-06 ENCOUNTER — Other Ambulatory Visit: Payer: Self-pay | Admitting: Neurology

## 2022-10-06 ENCOUNTER — Other Ambulatory Visit (HOSPITAL_COMMUNITY): Payer: Self-pay

## 2022-10-06 DIAGNOSIS — G35 Multiple sclerosis: Secondary | ICD-10-CM

## 2022-10-06 DIAGNOSIS — R296 Repeated falls: Secondary | ICD-10-CM | POA: Diagnosis not present

## 2022-10-06 DIAGNOSIS — S42211D Unspecified displaced fracture of surgical neck of right humerus, subsequent encounter for fracture with routine healing: Secondary | ICD-10-CM | POA: Diagnosis not present

## 2022-10-06 DIAGNOSIS — R260 Ataxic gait: Secondary | ICD-10-CM | POA: Diagnosis not present

## 2022-10-06 MED ORDER — BUDESONIDE 0.5 MG/2ML IN SUSP: RESPIRATORY_TRACT | 5 refills | Status: DC

## 2022-10-06 MED ORDER — FORMOTEROL FUMARATE 20 MCG/2ML IN NEBU: INHALATION_SOLUTION | RESPIRATORY_TRACT | 5 refills | Status: DC

## 2022-10-06 MED ORDER — YUPELRI 175 MCG/3ML IN SOLN: RESPIRATORY_TRACT | 5 refills | Status: DC

## 2022-10-07 NOTE — Telephone Encounter (Signed)
Morrie Sheldon with [employer] stopped by and dropped off order forms for patients oxygen, as he states patient has called him and patient doesn't want to go back to hospital she just wants her oxygen. Forms will be in providers box up front, Morrie Sheldon asked if he could be given a call once it has been faxed.

## 2022-10-07 NOTE — Telephone Encounter (Signed)
Form has been signed by PCP and faxed back to [facility].

## 2022-10-09 NOTE — Addendum Note (Signed)
Addended by: Alphonzo Cruise on: 10/09/2022 04:59 PM   Modules accepted: Orders

## 2022-10-09 NOTE — Telephone Encounter (Signed)
Received word from [place] with [name] that Kayla [name] was placed on 24 hour oxygen by her PCP.  Informed Dr. Lucie Leather and he wants to have her do ONO on oxygen.  Informed patient of change in plans.  Will send in new order for ONO with oxygen and will inform [facility].

## 2022-10-13 ENCOUNTER — Encounter: Payer: Self-pay | Admitting: Allergy and Immunology

## 2022-10-14 ENCOUNTER — Ambulatory Visit
Admission: RE | Admit: 2022-10-14 | Discharge: 2022-10-14 | Disposition: A | Discharge status: Home / Self Care | Payer: Medicare Other | Source: Ambulatory Visit | Attending: Neurology | Admitting: Neurology

## 2022-10-14 DIAGNOSIS — G35 Multiple sclerosis: Secondary | ICD-10-CM

## 2022-10-14 MED ORDER — GADOPICLENOL 0.5 MMOL/ML IV SOLN
4.0000 mL | Freq: Once | INTRAVENOUS | Status: AC | PRN
  Administered 2022-10-14: 4 mL via INTRAVENOUS

## 2022-10-15 ENCOUNTER — Other Ambulatory Visit (HOSPITAL_COMMUNITY): Payer: Self-pay

## 2022-10-15 ENCOUNTER — Other Ambulatory Visit: Payer: Self-pay

## 2022-10-15 MED ORDER — NYSTATIN 100000 UNIT/ML MT SUSP
OROMUCOSAL | 5 refills | Status: DC
  Filled 2022-10-15: qty 300, 10d supply, fill #0
  Filled 2022-11-14: qty 300, 10d supply, fill #1

## 2022-10-15 MED ORDER — NYSTATIN 100000 UNIT/ML MT SUSP: OROMUCOSAL | 5 refills | Status: DC

## 2022-10-16 ENCOUNTER — Ambulatory Visit
Admission: RE | Admit: 2022-10-16 | Discharge: 2022-10-16 | Disposition: A | Discharge status: Home / Self Care | Payer: Medicare Other | Source: Ambulatory Visit | Attending: Neurology | Admitting: Neurology

## 2022-10-16 DIAGNOSIS — G35 Multiple sclerosis: Secondary | ICD-10-CM | POA: Diagnosis not present

## 2022-10-16 MED ORDER — GADOPICLENOL 0.5 MMOL/ML IV SOLN
4.0000 mL | Freq: Once | INTRAVENOUS | Status: AC | PRN
  Administered 2022-10-16: 4 mL via INTRAVENOUS

## 2022-10-17 ENCOUNTER — Other Ambulatory Visit: Payer: Self-pay | Admitting: Internal Medicine

## 2022-10-17 ENCOUNTER — Telehealth: Payer: Self-pay | Admitting: Internal Medicine

## 2022-10-17 ENCOUNTER — Other Ambulatory Visit (HOSPITAL_COMMUNITY): Payer: Self-pay

## 2022-10-17 DIAGNOSIS — R1314 Dysphagia, pharyngoesophageal phase: Secondary | ICD-10-CM

## 2022-10-17 DIAGNOSIS — K2100 Gastro-esophageal reflux disease with esophagitis, without bleeding: Secondary | ICD-10-CM

## 2022-10-17 DIAGNOSIS — R600 Localized edema: Secondary | ICD-10-CM

## 2022-10-17 NOTE — Telephone Encounter (Signed)
Which PT office?

## 2022-10-17 NOTE — Telephone Encounter (Signed)
Will with [facility] called states patient has a productive cough with clear mucus and is on 2.5 liters of oxygen but her pulse ox is down to 80. He would like to possibly increase her oxygen. States she is also having trouble sleeping and would possibly like to increase her temazepam (RESTORIL) 15 MG capsule to help her sleep. States patient also is still using a self catheter to empty her bladder, would like to order a bag or foley catheter in case patient can no longer self catheter. Best callback number is [phone].

## 2022-10-17 NOTE — Telephone Encounter (Signed)
[facility] states the pt is requesting orders for home health physical therapy through [facility].

## 2022-10-18 ENCOUNTER — Inpatient Hospital Stay (HOSPITAL_COMMUNITY)
Admission: EM | Admit: 2022-10-18 | Discharge: 2022-10-24 | DRG: 193 | Disposition: A | Discharge status: Home Health | Payer: Medicare Other | Attending: Internal Medicine | Admitting: Internal Medicine

## 2022-10-18 ENCOUNTER — Other Ambulatory Visit: Payer: Self-pay

## 2022-10-18 ENCOUNTER — Emergency Department (HOSPITAL_COMMUNITY): Payer: Medicare Other

## 2022-10-18 DIAGNOSIS — J154 Pneumonia due to other streptococci: Principal | ICD-10-CM | POA: Diagnosis present

## 2022-10-18 DIAGNOSIS — E43 Unspecified severe protein-calorie malnutrition: Secondary | ICD-10-CM | POA: Diagnosis present

## 2022-10-18 DIAGNOSIS — R918 Other nonspecific abnormal finding of lung field: Secondary | ICD-10-CM | POA: Diagnosis not present

## 2022-10-18 DIAGNOSIS — J690 Pneumonitis due to inhalation of food and vomit: Secondary | ICD-10-CM | POA: Diagnosis present

## 2022-10-18 DIAGNOSIS — D638 Anemia in other chronic diseases classified elsewhere: Secondary | ICD-10-CM | POA: Diagnosis present

## 2022-10-18 DIAGNOSIS — J440 Chronic obstructive pulmonary disease with acute lower respiratory infection: Secondary | ICD-10-CM | POA: Diagnosis present

## 2022-10-18 DIAGNOSIS — Z7951 Long term (current) use of inhaled steroids: Secondary | ICD-10-CM

## 2022-10-18 DIAGNOSIS — B953 Streptococcus pneumoniae as the cause of diseases classified elsewhere: Secondary | ICD-10-CM | POA: Diagnosis present

## 2022-10-18 DIAGNOSIS — Y95 Nosocomial condition: Secondary | ICD-10-CM | POA: Diagnosis present

## 2022-10-18 DIAGNOSIS — Z8249 Family history of ischemic heart disease and other diseases of the circulatory system: Secondary | ICD-10-CM | POA: Diagnosis not present

## 2022-10-18 DIAGNOSIS — N319 Neuromuscular dysfunction of bladder, unspecified: Secondary | ICD-10-CM | POA: Diagnosis present

## 2022-10-18 DIAGNOSIS — Z87440 Personal history of urinary (tract) infections: Secondary | ICD-10-CM

## 2022-10-18 DIAGNOSIS — M47814 Spondylosis without myelopathy or radiculopathy, thoracic region: Secondary | ICD-10-CM | POA: Diagnosis present

## 2022-10-18 DIAGNOSIS — E871 Hypo-osmolality and hyponatremia: Secondary | ICD-10-CM | POA: Diagnosis present

## 2022-10-18 DIAGNOSIS — Z800 Family history of malignant neoplasm of digestive organs: Secondary | ICD-10-CM

## 2022-10-18 DIAGNOSIS — R7881 Bacteremia: Secondary | ICD-10-CM | POA: Diagnosis present

## 2022-10-18 DIAGNOSIS — K219 Gastro-esophageal reflux disease without esophagitis: Secondary | ICD-10-CM | POA: Diagnosis present

## 2022-10-18 DIAGNOSIS — Z9842 Cataract extraction status, left eye: Secondary | ICD-10-CM

## 2022-10-18 DIAGNOSIS — E038 Other specified hypothyroidism: Secondary | ICD-10-CM | POA: Diagnosis present

## 2022-10-18 DIAGNOSIS — Z79890 Hormone replacement therapy (postmenopausal): Secondary | ICD-10-CM | POA: Diagnosis not present

## 2022-10-18 DIAGNOSIS — D801 Nonfamilial hypogammaglobulinemia: Secondary | ICD-10-CM | POA: Diagnosis present

## 2022-10-18 DIAGNOSIS — Z961 Presence of intraocular lens: Secondary | ICD-10-CM | POA: Diagnosis present

## 2022-10-18 DIAGNOSIS — Z9841 Cataract extraction status, right eye: Secondary | ICD-10-CM

## 2022-10-18 DIAGNOSIS — J9621 Acute and chronic respiratory failure with hypoxia: Secondary | ICD-10-CM | POA: Diagnosis present

## 2022-10-18 DIAGNOSIS — E869 Volume depletion, unspecified: Secondary | ICD-10-CM | POA: Diagnosis not present

## 2022-10-18 DIAGNOSIS — J13 Pneumonia due to Streptococcus pneumoniae: Secondary | ICD-10-CM | POA: Diagnosis not present

## 2022-10-18 DIAGNOSIS — Z820 Family history of epilepsy and other diseases of the nervous system: Secondary | ICD-10-CM

## 2022-10-18 DIAGNOSIS — J449 Chronic obstructive pulmonary disease, unspecified: Secondary | ICD-10-CM | POA: Diagnosis present

## 2022-10-18 DIAGNOSIS — J470 Bronchiectasis with acute lower respiratory infection: Secondary | ICD-10-CM | POA: Diagnosis present

## 2022-10-18 DIAGNOSIS — Z885 Allergy status to narcotic agent status: Secondary | ICD-10-CM

## 2022-10-18 DIAGNOSIS — E039 Hypothyroidism, unspecified: Secondary | ICD-10-CM | POA: Diagnosis present

## 2022-10-18 DIAGNOSIS — R053 Chronic cough: Secondary | ICD-10-CM | POA: Diagnosis present

## 2022-10-18 DIAGNOSIS — E441 Mild protein-calorie malnutrition: Secondary | ICD-10-CM | POA: Diagnosis present

## 2022-10-18 DIAGNOSIS — Z23 Encounter for immunization: Secondary | ICD-10-CM

## 2022-10-18 DIAGNOSIS — Z681 Body mass index (BMI) 19 or less, adult: Secondary | ICD-10-CM | POA: Diagnosis not present

## 2022-10-18 DIAGNOSIS — Z888 Allergy status to other drugs, medicaments and biological substances status: Secondary | ICD-10-CM

## 2022-10-18 DIAGNOSIS — G4733 Obstructive sleep apnea (adult) (pediatric): Secondary | ICD-10-CM | POA: Diagnosis present

## 2022-10-18 DIAGNOSIS — D649 Anemia, unspecified: Secondary | ICD-10-CM | POA: Diagnosis present

## 2022-10-18 DIAGNOSIS — D75839 Thrombocytosis, unspecified: Secondary | ICD-10-CM | POA: Diagnosis present

## 2022-10-18 DIAGNOSIS — J189 Pneumonia, unspecified organism: Secondary | ICD-10-CM | POA: Diagnosis present

## 2022-10-18 DIAGNOSIS — Z91041 Radiographic dye allergy status: Secondary | ICD-10-CM

## 2022-10-18 DIAGNOSIS — J9601 Acute respiratory failure with hypoxia: Secondary | ICD-10-CM | POA: Diagnosis not present

## 2022-10-18 DIAGNOSIS — G35 Multiple sclerosis: Secondary | ICD-10-CM | POA: Diagnosis present

## 2022-10-18 DIAGNOSIS — R0602 Shortness of breath: Secondary | ICD-10-CM | POA: Diagnosis not present

## 2022-10-18 DIAGNOSIS — Z90710 Acquired absence of both cervix and uterus: Secondary | ICD-10-CM

## 2022-10-18 DIAGNOSIS — R7309 Other abnormal glucose: Secondary | ICD-10-CM

## 2022-10-18 DIAGNOSIS — F32A Depression, unspecified: Secondary | ICD-10-CM | POA: Diagnosis present

## 2022-10-18 DIAGNOSIS — M810 Age-related osteoporosis without current pathological fracture: Secondary | ICD-10-CM | POA: Diagnosis present

## 2022-10-18 DIAGNOSIS — Z79899 Other long term (current) drug therapy: Secondary | ICD-10-CM

## 2022-10-18 DIAGNOSIS — R8271 Bacteriuria: Secondary | ICD-10-CM | POA: Diagnosis present

## 2022-10-18 DIAGNOSIS — Z8541 Personal history of malignant neoplasm of cervix uteri: Secondary | ICD-10-CM

## 2022-10-18 DIAGNOSIS — J3802 Paralysis of vocal cords and larynx, bilateral: Secondary | ICD-10-CM | POA: Diagnosis present

## 2022-10-18 DIAGNOSIS — J90 Pleural effusion, not elsewhere classified: Secondary | ICD-10-CM | POA: Diagnosis not present

## 2022-10-18 DIAGNOSIS — Z96641 Presence of right artificial hip joint: Secondary | ICD-10-CM | POA: Diagnosis present

## 2022-10-18 LAB — URINALYSIS, W/ REFLEX TO CULTURE (INFECTION SUSPECTED)
Bilirubin Urine: NEGATIVE
Glucose, UA: NEGATIVE mg/dL
Hgb urine dipstick: NEGATIVE
Ketones, ur: 5 mg/dL — AB
Nitrite: NEGATIVE
Protein, ur: NEGATIVE mg/dL
Specific Gravity, Urine: 1.017 (ref 1.005–1.030)
WBC, UA: >50 WBC/hpf (ref 0–5)
pH: 5 (ref 5.0–8.0)

## 2022-10-18 LAB — BLOOD CULTURE ID PANEL (REFLEXED) - BCID2

## 2022-10-18 LAB — CBC WITH DIFFERENTIAL/PLATELET
Abs Immature Granulocytes: 0.3 10*3/uL — ABNORMAL HIGH (ref 0.00–0.07)
Basophils Absolute: 0.1 10*3/uL (ref 0.0–0.1)
Basophils Relative: 0 %
Eosinophils Absolute: 0.1 10*3/uL (ref 0.0–0.5)
Eosinophils Relative: 0 %
HCT: 35.4 % — ABNORMAL LOW (ref 36.0–46.0)
Hemoglobin: 11.3 g/dL — ABNORMAL LOW (ref 12.0–15.0)
Immature Granulocytes: 1 %
Lymphocytes Relative: 6 %
Lymphs Abs: 2 10*3/uL (ref 0.7–4.0)
MCH: 28.8 pg (ref 26.0–34.0)
MCHC: 31.9 g/dL (ref 30.0–36.0)
MCV: 90.1 fL (ref 80.0–100.0)
Monocytes Absolute: 1.4 10*3/uL — ABNORMAL HIGH (ref 0.1–1.0)
Monocytes Relative: 4 %
Neutro Abs: 28.4 10*3/uL — ABNORMAL HIGH (ref 1.7–7.7)
Neutrophils Relative %: 89 %
Platelets: 688 10*3/uL — ABNORMAL HIGH (ref 150–400)
RBC: 3.93 MIL/uL (ref 3.87–5.11)
RDW: 17 % — ABNORMAL HIGH (ref 11.5–15.5)
WBC: 32.2 10*3/uL — ABNORMAL HIGH (ref 4.0–10.5)
nRBC: 0 % (ref 0.0–0.2)

## 2022-10-18 LAB — COMPREHENSIVE METABOLIC PANEL
ALT: 23 U/L (ref 0–44)
AST: 26 U/L (ref 15–41)
Albumin: 3 g/dL — ABNORMAL LOW (ref 3.5–5.0)
Alkaline Phosphatase: 96 U/L (ref 38–126)
Anion gap: 15 (ref 5–15)
BUN: 19 mg/dL (ref 8–23)
CO2: 24 mmol/L (ref 22–32)
Calcium: 9.5 mg/dL (ref 8.9–10.3)
Chloride: 91 mmol/L — ABNORMAL LOW (ref 98–111)
Creatinine, Ser: 0.74 mg/dL (ref 0.44–1.00)
GFR, Estimated: >60 mL/min (ref >60)
Glucose, Bld: 120 mg/dL — ABNORMAL HIGH (ref 70–99)
Potassium: 3.5 mmol/L (ref 3.5–5.1)
Sodium: 130 mmol/L — ABNORMAL LOW (ref 135–145)
Total Bilirubin: 0.5 mg/dL (ref 0.3–1.2)
Total Protein: 6.9 g/dL (ref 6.5–8.1)

## 2022-10-18 LAB — BLOOD GAS, ARTERIAL
Acid-Base Excess: 4.6 mmol/L — ABNORMAL HIGH (ref 0.0–2.0)
Bicarbonate: 28.3 mmol/L — ABNORMAL HIGH (ref 20.0–28.0)
Drawn by: 11249
Expiratory PAP: 5 cmH2O
FIO2: 80 %
Inspiratory PAP: 10 cmH2O
O2 Saturation: 100 %
Patient temperature: 37.2
pCO2 arterial: 38 mmHg (ref 32–48)
pH, Arterial: 7.48 — ABNORMAL HIGH (ref 7.35–7.45)
pO2, Arterial: 135 mmHg — ABNORMAL HIGH (ref 83–108)

## 2022-10-18 LAB — PROTIME-INR
INR: 1.2 (ref 0.8–1.2)
Prothrombin Time: 15.1 seconds (ref 11.4–15.2)

## 2022-10-18 LAB — APTT: aPTT: 32 seconds (ref 24–36)

## 2022-10-18 LAB — LACTIC ACID, PLASMA
Lactic Acid, Venous: 1.6 mmol/L (ref 0.5–1.9)
Lactic Acid, Venous: 1.1 mmol/L (ref 0.5–1.9)

## 2022-10-18 MED ORDER — VANCOMYCIN HCL IN DEXTROSE 1-5 GM/200ML-% IV SOLN: 1000.0000 mg | INTRAVENOUS | Status: DC

## 2022-10-18 MED ORDER — ONDANSETRON HCL 4 MG PO TABS: 4.0000 mg | ORAL_TABLET | Freq: Four times a day (QID) | ORAL | Status: DC | PRN

## 2022-10-18 MED ORDER — SODIUM CHLORIDE 0.9 % IV SOLN
2.0000 g | Freq: Once | INTRAVENOUS | Status: AC
  Administered 2022-10-18: 2 g via INTRAVENOUS
  Filled 2022-10-18: qty 12.5

## 2022-10-18 MED ORDER — ACETAMINOPHEN 325 MG PO TABS: 650.0000 mg | ORAL_TABLET | Freq: Four times a day (QID) | ORAL | Status: DC | PRN

## 2022-10-18 MED ORDER — SODIUM CHLORIDE 0.9 % IV SOLN
2.0000 g | Freq: Two times a day (BID) | INTRAVENOUS | Status: DC
  Administered 2022-10-18: 2 g via INTRAVENOUS
  Filled 2022-10-18: qty 12.5

## 2022-10-18 MED ORDER — LEVALBUTEROL HCL 0.63 MG/3ML IN NEBU
0.6300 mg | INHALATION_SOLUTION | Freq: Four times a day (QID) | RESPIRATORY_TRACT | Status: DC | PRN
  Administered 2022-10-19: 0.63 mg via RESPIRATORY_TRACT
  Filled 2022-10-18: qty 3

## 2022-10-18 MED ORDER — VANCOMYCIN HCL IN DEXTROSE 1-5 GM/200ML-% IV SOLN
1000.0000 mg | Freq: Once | INTRAVENOUS | Status: AC
  Administered 2022-10-18: 1000 mg via INTRAVENOUS
  Filled 2022-10-18: qty 200

## 2022-10-18 MED ORDER — SODIUM CHLORIDE 0.9 % IV SOLN: INTRAVENOUS | Status: AC

## 2022-10-18 MED ORDER — SODIUM CHLORIDE 0.9 % IV SOLN
500.0000 mg | Freq: Once | INTRAVENOUS | Status: AC
  Administered 2022-10-18: 500 mg via INTRAVENOUS
  Filled 2022-10-18: qty 5

## 2022-10-18 MED ORDER — SODIUM CHLORIDE 0.9 % IV SOLN: 150.0000 mL/h | INTRAVENOUS | Status: DC

## 2022-10-18 MED ORDER — ONDANSETRON HCL 4 MG/2ML IJ SOLN: 4.0000 mg | Freq: Four times a day (QID) | INTRAMUSCULAR | Status: DC | PRN

## 2022-10-18 MED ORDER — FAMOTIDINE IN NACL 20-0.9 MG/50ML-% IV SOLN
20.0000 mg | INTRAVENOUS | Status: DC
  Administered 2022-10-18 – 2022-10-19 (×2): 20 mg via INTRAVENOUS
  Filled 2022-10-18 (×2): qty 50

## 2022-10-18 MED ORDER — LACTATED RINGERS IV BOLUS
1000.0000 mL | Freq: Once | INTRAVENOUS | Status: AC
  Administered 2022-10-18: 1000 mL via INTRAVENOUS

## 2022-10-18 MED ORDER — ACETAMINOPHEN 650 MG RE SUPP: 650.0000 mg | Freq: Four times a day (QID) | RECTAL | Status: DC | PRN

## 2022-10-18 MED ORDER — SODIUM CHLORIDE 0.9 % IV SOLN
2.0000 g | Freq: Once | INTRAVENOUS | Status: AC
  Administered 2022-10-18: 2 g via INTRAVENOUS
  Filled 2022-10-18: qty 20

## 2022-10-18 MED ORDER — ENOXAPARIN SODIUM 30 MG/0.3ML IJ SOSY
30.0000 mg | PREFILLED_SYRINGE | INTRAMUSCULAR | Status: DC
  Administered 2022-10-18: 30 mg via SUBCUTANEOUS
  Filled 2022-10-18: qty 0.3

## 2022-10-18 MED ORDER — METRONIDAZOLE 500 MG/100ML IV SOLN
500.0000 mg | Freq: Two times a day (BID) | INTRAVENOUS | Status: DC
  Administered 2022-10-18 (×2): 500 mg via INTRAVENOUS
  Filled 2022-10-18 (×2): qty 100

## 2022-10-18 NOTE — Progress Notes (Signed)
Pharmacy Antibiotic Note  Kayla [name] is a 72 y.o. female admitted on 10/18/2022 with pneumonia.  Pharmacy has been consulted for vancomycin and cefepime dosing.  Plan: Cefepime 2 gr IV q12h  Vancomycin 1000 mg IV q36h ( AUC 545, Wt from PCP visit on 7/8, Scr rounded to 0.8 mg/dl)  Metronidazole 664 mg IV q12h      Temp (24hrs), Avg:98.4 F (36.9 C), Min:98.2 F (36.8 C), Max:98.5 F (36.9 C)  Recent Labs  Lab 10/18/22 0351 10/18/22 0359  WBC  --  32.2*  CREATININE  --  0.74  LATICACIDVEN 1.6  --     CrCl cannot be calculated (Unknown ideal weight.).    Allergies  Allergen Reactions   Tramadol     hallucinations    Contrast Media [Iodinated Contrast Media] Rash   Iohexol Hives   Morphine And Codeine Other (See Comments)    Reaction:  Hallucinations    Methylprednisolone Other (See Comments)    Reaction:  Decreases pts heart rate    Hydrocodone Itching   Oxycodone-Acetaminophen Rash and Other (See Comments)    Reaction:  Hallucinations    Phenytoin Sodium Extended Rash   Zanaflex [Tizanidine Hydrochloride] Rash    Antimicrobials this admission: 7/27 vancomycin >>  7/27 cefepime >>  7/27 metronidazole >>   Dose adjustments this admission:   Microbiology results: 7/27 BCx:  7/27 UCx:      Thank you for allowing pharmacy to be a part of this patient's care.   Adalberto Cole, PharmD, BCPS 10/18/2022 8:32 AM

## 2022-10-18 NOTE — ED Notes (Signed)
[facility] arrived to transport patient to [facility]

## 2022-10-18 NOTE — Progress Notes (Signed)
   10/18/22 0412  BiPAP/CPAP/SIPAP  $ Non-Invasive Ventilator  Non-Invasive Vent Subsequent  $ Face Mask Small Yes  BiPAP/CPAP/SIPAP V60  Mask Type Full face mask  Mask Size Small  Set Rate 14 breaths/min  Respiratory Rate 30 breaths/min  IPAP 5 cmH20 (above peep)  PEEP 5 cmH20  FiO2 (%) 80 %  Minute Ventilation 11.5  Leak 77  Peak Inspiratory Pressure (PIP) 10  Tidal Volume (Vt) 418  Auto Titrate No  BiPAP/CPAP /SiPAP Vitals  Pulse Rate (!) 111  Resp (!) 28  SpO2 92 %  MEWS Score/Color  MEWS Score 4  MEWS Score Color Red

## 2022-10-18 NOTE — ED Provider Notes (Signed)
[facility] AT [facility] Provider Note   CSN: [id number] Arrival date & time: 10/18/22  9528     History  Chief Complaint  Patient presents with   Respiratory Distress    Kayla [name] is a 72 y.o. female.  The history is provided by the patient and the spouse.  She has history of multiple sclerosis, immunodeficiency, GERD and is under palliative care for generally declining physical state and is brought in because of worsening shortness of breath.  This has been progressive over the last 3 weeks.  Home oxygen levels have been low and spite of supplemental oxygen.  Her husband increased her oxygen from 2.5 L to 3 L.  She was significantly worse tonight.  She has run low-grade fevers as high as 100.2.  Appetite is diminished and she has not been eating well.  Of note, even though she is under palliative care, she does wish to be full code, including intubation if needed.  She did have recent MRI scan to see if she is having an exacerbation of multiple sclerosis.   Home Medications Prior to Admission medications   Medication Sig Start Date End Date Taking? Authorizing Provider  albuterol (PROVENTIL) (2.5 MG/3ML) 0.083% nebulizer solution Inhale 3 mLs (2.5 mg total) by nebulization every 4 (four) hours as needed for wheezing or shortness of breath. 09/24/22 09/24/23  [place], Alvira Philips, DO  budesonide (PULMICORT) 0.5 MG/2ML nebulizer solution Use one vial in the nebulizer twice daily 10/06/22   Kozlow, Alvira Philips, MD  calcium carbonate (OS-CAL - DOSED IN MG OF ELEMENTAL CALCIUM) 1250 (500 Ca) MG tablet Take 1 tablet (1,250 mg total) by mouth daily with breakfast. 06/22/22   Arnetha Courser, MD  Coenzyme Q10 (VITALINE COQ10) 300 MG WAFR Take 300 mg by mouth in the morning.    [provider]  dexlansoprazole (DEXILANT) 60 MG capsule TAKE 1 CAPSULE BY MOUTH ONCE DAILY WITH BREAKFAST 10/17/22   [name], MD  docusate sodium (COLACE) 100 MG capsule Take 1  capsule (100 mg total) by mouth in the morning and at bedtime. 09/04/22   Glade Lloyd, MD  famotidine (PEPCID) 20 MG tablet One after supper Patient taking differently: Take 20 mg by mouth at bedtime. One after supper 06/07/20   [name], MD  Ferric Maltol (ACCRUFER) 30 MG CAPS Take 1 capsule (30 mg total) by mouth in the morning and at bedtime. Patient taking differently: Take 30 mg by mouth in the morning and at bedtime. 09/09/22   [name], MD  FLUoxetine (PROZAC) 40 MG capsule Take 1 capsule (40 mg total) by mouth daily. 04/09/22   [name], MD  Fluticasone-Umeclidin-Vilant (TRELEGY ELLIPTA) 100-62.5-25 MCG/ACT AEPB Inhale 1 puff into the lungs daily. 05/26/22   [name], MD  formoterol (PERFOROMIST) 20 MCG/2ML nebulizer solution Use one vial in the nebulizer twice daily 10/06/22   Kozlow, Alvira Philips, MD  guaiFENesin (MUCINEX) 600 MG 12 hr tablet Take 1 tablet (600 mg total) by mouth 2 (two) times daily. Patient taking differently: Take 600 mg by mouth 2 (two) times daily as needed for cough or to loosen phlegm. 09/04/22   Glade Lloyd, MD  levothyroxine (SYNTHROID) 100 MCG tablet TAKE 1 TABLET BY MOUTH ONCE  DAILY BEFORE BREAKFAST Patient taking differently: Take 100 mcg by mouth daily before breakfast. 12/31/21   [name], MD  methenamine (HIPREX) 1 g tablet Take 1 tablet (1 g total) by mouth 2 (  two) times daily with a meal. 08/06/22   [name], MD  mirabegron ER (MYRBETRIQ) 50 MG TB24 tablet Take 50 mg by mouth daily.    [provider]  mirtazapine (REMERON) 15 MG tablet Take 1 tablet (15 mg total) by mouth at bedtime. 09/09/22   [name], MD  Multiple Vitamin (MULTIVITAMIN WITH MINERALS) TABS tablet Take 1 tablet by mouth in the morning.    [provider]  nystatin (MYCOSTATIN) 100000 UNIT/ML suspension Swish, gargle, and swallow 5 mL after budesonide nebulizer treatment. 10/15/22   Kozlow, Alvira Philips, MD  Probiotic Product (PROBIOTIC  PO) Take 1 capsule by mouth daily.    [provider]  Respiratory Therapy Supplies (FLUTTER) DEVI Use as directed 06/02/17   [name], MD  revefenacin Mikael Spray) 175 MCG/3ML nebulizer solution Use one vial in the nebulizer once daily 10/06/22   Kozlow, Alvira Philips, MD  sodium chloride HYPERTONIC 3 % nebulizer solution Take by nebulization 2 (two) times daily as needed for cough. Dx J44.9 Patient taking differently: Take 4 mLs by nebulization 2 (two) times daily as needed for cough. Dx J44.9 08/29/22   Kalman Shan, MD  tamsulosin (FLOMAX) 0.4 MG CAPS capsule Take 0.4 mg by mouth daily.    [provider]  temazepam (RESTORIL) 15 MG capsule Take 1 capsule (15 mg total) by mouth at bedtime. 07/11/22   [name], MD  torsemide (DEMADEX) 10 MG tablet Take 1 tablet (10 mg total) by mouth daily. 10/17/22   [name], MD  trimethoprim (TRIMPEX) 100 MG tablet Take 100 mg by mouth daily. 11/02/21   [provider]      Allergies    Tramadol, Contrast media [iodinated contrast media], Iohexol, Morphine and codeine, Methylprednisolone, Hydrocodone, Oxycodone-acetaminophen, Phenytoin sodium extended, and Zanaflex [tizanidine hydrochloride]    Review of Systems   Review of Systems  All other systems reviewed and are negative.   Physical Exam Updated Vital Signs BP 132/76   Pulse (!) 114   Temp 98.2 F (36.8 C) (Oral)   Resp (!) 32   SpO2 (!) 88%  Physical Exam Vitals and nursing note reviewed.   72 year old female, resting comfortably and in no acute distress. Vital signs are significant for elevated heart rate and respiratory rate. Oxygen saturation is 88%, which is hypoxic. Head is normocephalic and atraumatic. PERRLA, EOMI. Oropharynx is clear. Neck is nontender and supple without adenopathy or JVD. Back is nontender and there is no CVA tenderness. Lungs are clear without rales, wheezes, or rhonchi. Chest is nontender. Heart has regular rate and rhythm  without murmur. Abdomen is soft, flat, nontender. Extremities have no cyanosis or edema, full range of motion is present. Skin is warm and dry without rash. Neurologic: Awake and alert, cranial nerves are grossly intact.  She has generalized weakness which seems to be worse on the right and also worse in the legs with strength 2-3/5 throughout.  ED Results / Procedures / Treatments   Labs (all labs ordered are listed, but only abnormal results are displayed) Labs Reviewed  CULTURE, BLOOD (ROUTINE X 2)  CULTURE, BLOOD (ROUTINE X 2)  BLOOD GAS, ARTERIAL  COMPREHENSIVE METABOLIC PANEL  CBC WITH DIFFERENTIAL/PLATELET  PROTIME-INR  APTT  URINALYSIS, W/ REFLEX TO CULTURE (INFECTION SUSPECTED)  LACTIC ACID, PLASMA  LACTIC ACID, PLASMA    EKG None  Radiology MR BRAIN W WO CONTRAST  Result Date: 10/16/2022 CLINICAL DATA:  Provided history: Multiple sclerosis. EXAM: MRI HEAD  WITHOUT AND WITH CONTRAST TECHNIQUE: Multiplanar, multiecho pulse sequences of the brain and surrounding structures were obtained without and with intravenous contrast. CONTRAST:  4 mL Vueway intravenous contrast.  Head CT COMPARISON:  Head CT 06/19/2022.  Brain MRI 08/17/2021. FINDINGS: Brain: No age advanced or lobar predominant parenchymal atrophy. Multifocal T2 FLAIR hyperintense signal abnormality within the deep periventricular greater than juxtacortical cerebral white matter, as well as callososeptal interface, not appreciably changed from the prior brain MRI of 08/17/2021. No white matter lesion is identified within the posterior fossa. No pathologic enhancement to suggest active demyelination. There is no acute infarct. No evidence of an intracranial mass. No chronic intracranial blood products. No extra-axial fluid collection. No midline shift. No pathologic intracranial enhancement identified. Vascular: Maintained flow voids within the proximal large arterial vessels. Skull and upper cervical spine: No focal  suspicious marrow lesion. Sinuses/Orbits: No mass or acute finding within the imaged orbits. Prior bilateral ocular lens replacement. Peripheral T2 hyperintense foci within the bilateral maxillary sinuses, likely reflecting a combination of mucous retention cysts and mucosal thickening, overall moderate/severe. Moderate-volume frothy secretions within the left sphenoid sinus. Other: Small-volume fluid within the bilateral mastoid air cells. IMPRESSION: 1. Multifocal T2 FLAIR hyperintense signal abnormality within the supratentorial white matter compatible with the provided history of multiple sclerosis. Stable burden of white matter lesions as compared to the prior MRI of 08/17/2021. No evidence of active demyelination. 2. Paranasal sinus disease as described. 3. Small-volume fluid within the bilateral mastoid air cells. Electronically Signed   By: Jackey Loge D.O.   On: 10/16/2022 17:55    Procedures Procedures  Cardiac monitor shows sinus tachycardia, per my interpretation.  Medications Ordered in ED Medications  lactated ringers bolus 1,000 mL (has no administration in time range)    ED Course/ Medical Decision Making/ A&P                             Medical Decision Making Amount and/or Complexity of Data Reviewed Labs: ordered. Radiology: ordered.  Risk Decision regarding hospitalization.   Acute on chronic respiratory failure likely 2 combination of weakness and possible aspiration.  She was placed on nonrebreather oxygen mask but is only barely able to maintain oxygen saturations in the upper 80s to low 90s with that.  Therefore, I have ordered BiPAP.  I reviewed her past records, and MRI scans of brain, cervical spine, thoracic spine on 10/14/2022 showed no new active lesions of multiple sclerosis.  She had been admitted to the hospital on 09/16/2022-09/24/2022 for pneumonia, also admission 08/31/2022-09/04/2022 for pneumonia.  On BiPAP, patient is resting comfortably and oxygen  saturation has improved to 95-96%.  Chest x-ray shows new right upper lobe infiltrate with stable bibasilar atelectasis and/or infiltrate.  I have independently viewed the image, and agree with radiologist's interpretation.  I have reviewed and interpreted her laboratory tests, and my interpretation is normal lactic acid level, arterial blood gas showing no evidence of respiratory acidosis, marked leukocytosis with left shift, mild anemia improved over baseline, mild hyponatremia of doubtful clinical significance, elevated random glucose level.  I have discussed case with Dr. Margo Aye of [facility] hospitalist, who agrees to admit the patient with plan to transfer to [facility] for better availability of neurologic testing.  CRITICAL CARE Performed by: Dione Booze Total critical care time: 60 minutes Critical care time was exclusive of separately billable procedures and treating other patients. Critical care was necessary to treat or prevent  imminent or life-threatening deterioration. Critical care was time spent personally by me on the following activities: development of treatment plan with patient and/or surrogate as well as nursing, discussions with consultants, evaluation of patient's response to treatment, examination of patient, obtaining history from patient or surrogate, ordering and performing treatments and interventions, ordering and review of laboratory studies, ordering and review of radiographic studies, pulse oximetry and re-evaluation of patient's condition.  Final Clinical Impression(s) / ED Diagnoses Final diagnoses:  Community acquired pneumonia, unspecified laterality  Acute respiratory failure with hypoxia (HCC)  Normochromic normocytic anemia  Hyponatremia  Elevated random blood glucose level    Rx / DC Orders ED Discharge Orders     None         Dione Booze, MD 10/18/22 [phone]

## 2022-10-18 NOTE — Progress Notes (Signed)
   10/18/22 2224  BiPAP/CPAP/SIPAP  Reason BIPAP/CPAP not in use Non-compliant   Pt does not wish to use a hospital CPAP unit tonight, but will notify RT if she changes her mind

## 2022-10-18 NOTE — ED Notes (Signed)
RN called to [facility] to give report to nurse who is on break, I left my number so nurse may return my phone call

## 2022-10-18 NOTE — ED Notes (Signed)
RN s/w Lanora Manis, RN at [facility] and gave report as well as called [facility] to have patient transported

## 2022-10-18 NOTE — H&P (Signed)
History and Physical    Patient: Kayla [name] JYN:[id number] DOB: March 13, 1951 DOA: 10/18/2022 DOS: the patient was seen and examined on 10/18/2022 PCP: [name], MD  Patient coming from: Home  Chief Complaint:  Chief Complaint  Patient presents with   Respiratory Distress   HPI: Kayla [name] is a 72 y.o. female with medical history significant of Osteoarthritis, asthma, vocal cord paralysis, bronchiectasis, cervical cancer, depression, GERD, hiatal hernia, hypothyroidism, multiple sclerosis, immunodeficiency on IVIG infusions, osteoporosis, urinary retention, recently admitted and discharged due to acute respiratory failure with hypoxia in the setting of community-acquired pneumonia that has been recurrent versus nonresolving.  She also had rhinovirus infection and developed strep pneumonia bacteremia who was brought to the emergency department due to progressive worsening of chronic dyspnea associated with productive cough.  Patient was hypoxic in the 70s on 3 L.  EMS put her on NRB mask and her O2 sat increased to 91%.  The patient also stated that she has had progressively worse weakness in her lower extremities, low-grade fevers, decreased appetite and malaise. No chest pain, palpitations, diaphoresis, PND, orthopnea or pitting edema of the lower extremities.  No abdominal pain, diarrhea, constipation, melena or hematochezia.  No flank pain, dysuria, frequency or hematuria.  No polyuria, polydipsia or polyphagia.  Lab work: CBC showed a white count of 32.2 with 89% neutrophils, hemoglobin 11.3 g/dL platelets 865.  Normal PT, INR and PTT.  Arterial blood gas showed a pH of 7.48, pCO2 of 38, pO2 of 135 mmHg.  Bicarbonate 28.3 and acid-base excess 4.6 mmol/L.  CMP showed a sodium 130 and chloride 91 mmol/L.  Glucose 120 mg deciliter and albumin 3.0 g/dL.  The rest of the CMP measurements were normal.  Lactic acid x 1 normal.  Urinalysis still pending.  Imaging: Portable 1 view chest  radiograph showing new area of mild to moderate severity right upper lobe infiltrate.  Predominantly stable mild to moderate severity bivascular atelectasis or infiltrate.  Small, stable bilateral pleural effusions.  Serial 10/16/2022 MRI of the brain with and without contrast showed stable burden of white matter lesions 6 compared to a prior MRI in May 2023.  No evidence of demyelination.  Paranasal sinus disease.  MRI cervical spine with no lesions identified within the cervical spinal cord.  There was multiple levels with DDD.  Level curvature of the cervical spine.  MRI of the thoracic spine was mildly degraded with no lesions identified within the thoracic spinal cord or given this limitation.  There is thoracic spondylosis as outlined and unchanged from the prior thoracic MRI from May 2023.  There was multifocal airspace consolidation greatest within the mid to [facility] upper lung.  Findings may reflect pneumonia.  PA and lateral chest radiographs recommended.   ED course: Initial vital signs were temperature 98.2 F, pulse 115, respirations 36, BP 132/76 mmHg and O2 sat 74% on nasal cannula oxygen at 3 LPM.  The patient received azithromycin 500 mg IVPB, ceftriaxone 2 g IVPB and LR 1000 mL bolus.  Review of Systems: As mentioned in the history of present illness. All other systems reviewed and are negative. Past Medical History:  Diagnosis Date   Arthritis    oa   Asthma    Bilateral vocal cord paralysis    Bronchiectasis (HCC)    Cervical cancer (HCC) 2005   Depression    GERD (gastroesophageal reflux disease)    Hiatal hernia    Hypothyroidism    Immunodeficiency (HCC)    Gets  IVIG infusions   Insomnia    Multiple sclerosis (HCC) 1996   Osteoporosis    Pneumonia 2024, 2014, 1992   Urine retention    Past Surgical History:  Procedure Laterality Date   ABDOMINAL HYSTERECTOMY  03/25/2003   for cervical cancer, complete   BRONCHIAL BIOPSY  10/26/2020   Procedure: BRONCHIAL BIOPSIES;   Surgeon: Josephine Igo, DO;  Location: [facility];  Service: Pulmonary;;   BRONCHIAL BRUSHINGS  10/26/2020   Procedure: BRONCHIAL BRUSHINGS;  Surgeon: Josephine Igo, DO;  [facility]: [facility];  Service: Pulmonary;;   BRONCHIAL WASHINGS  10/26/2020   Procedure: BRONCHIAL WASHINGS;  Surgeon: Josephine Igo, DO;  [facility];  Service: Pulmonary;;   BRONCHIAL WASHINGS  08/19/2022   Procedure: BRONCHIAL WASHINGS;  Surgeon: Josephine Igo, DO;  [facility]: [facility];  Service: Cardiopulmonary;;   CATARACT EXTRACTION, BILATERAL Bilateral    lens for cataracts   CONVERSION TO TOTAL HIP Right 09/18/2015   Procedure: CONVERSION OF RIGHT HEMI ARTHROPLASTY TO TOTAL HIP ARTHROPLASTY ACETABULAR REVISON ;  Surgeon: Durene Romans, MD;  Location: [facility];  Service: Orthopedics;  Laterality: Right;   HIP ARTHROPLASTY Right 03/01/2013   Procedure: ARTHROPLASTY BIPOLAR HIP;  Surgeon: Shelda Pal, MD;  Location: [facility];  Service: Orthopedics;  Laterality: Right;   HIP CLOSED REDUCTION Right 03/12/2013   Procedure: CLOSED REDUCTION HIP;  Surgeon: [name], MD;  Location: [facility];  Service: Orthopedics;  Laterality: Right;   HIP CLOSED REDUCTION Right 03/19/2013   Procedure: CLOSED REDUCTION HIP;  Surgeon: Javier Docker, MD;  Location: [facility];  Service: Orthopedics;  Laterality: Right;   HIP CLOSED REDUCTION Right 11/10/2015   Procedure: CLOSED MANIPULATION HIP;  Surgeon: Samson Frederic, MD;  Location: [facility];  Service: Orthopedics;  Laterality: Right;   HIP CLOSED REDUCTION Right 06/05/2017   Procedure: CLOSED REDUCTION HIP;  Surgeon: Ranee Gosselin, MD;  Location: [facility];  Service: Orthopedics;  Laterality: Right;   SMALL INTESTINE SURGERY     TEE WITHOUT CARDIOVERSION N/A 09/22/2022   Procedure: TRANSESOPHAGEAL ECHOCARDIOGRAM;  Surgeon: Pricilla Riffle, MD;  Location: [facility] INVASIVE CV LAB;  Service: Cardiovascular;  Laterality: N/A;   TOTAL HIP REVISION Right 06/06/2017   Procedure:  TOTAL HIP REVISION;  Surgeon: Ranee Gosselin, MD;  Location: [facility];  Service: Orthopedics;  Laterality: Right;   VESICOVAGINAL FISTULA CLOSURE W/ TAH  03/25/2003   VIDEO BRONCHOSCOPY N/A 10/26/2020   Procedure: VIDEO BRONCHOSCOPY WITH FLUORO;  Surgeon: Josephine Igo, DO;  [facility];  Service: Pulmonary;  Laterality: N/A;   VIDEO BRONCHOSCOPY Left 08/19/2022   Procedure: VIDEO BRONCHOSCOPY WITH FLUORO; WITH BAL;  Surgeon: Josephine Igo, DO;  [facility];  Service: Cardiopulmonary;  Laterality: Left;   Social History:  reports that she has never smoked. She has never used smokeless tobacco. She reports that she does not currently use alcohol after a past usage of about 2.0 standard drinks of alcohol per week. She reports that she does not use drugs.  Allergies  Allergen Reactions   Tramadol     hallucinations    Contrast Media [Iodinated Contrast Media] Rash   Iohexol Hives   Morphine And Codeine Other (See Comments)    Reaction:  Hallucinations    Methylprednisolone Other (See Comments)    Reaction:  Decreases pts heart rate    Hydrocodone Itching   Oxycodone-Acetaminophen Rash and Other (See Comments)    Reaction:  Hallucinations    Phenytoin Sodium Extended  Rash   Zanaflex [Tizanidine Hydrochloride] Rash    Family History  Problem Relation Age of Onset   Other Mother        COVID   Colon cancer Father    Atrial fibrillation Brother    Heart attack Maternal Grandfather    Parkinson's disease Paternal Grandfather    Esophageal cancer Neg Hx    Stomach cancer Neg Hx    Rectal cancer Neg Hx     Prior to Admission medications   Medication Sig Start Date End Date Taking? Authorizing Provider  albuterol (PROVENTIL) (2.5 MG/3ML) 0.083% nebulizer solution Inhale 3 mLs (2.5 mg total) by nebulization every 4 (four) hours as needed for wheezing or shortness of breath. 09/24/22 09/24/23  [place], Alvira Philips, DO  budesonide (PULMICORT) 0.5 MG/2ML nebulizer solution  Use one vial in the nebulizer twice daily 10/06/22   Kozlow, Alvira Philips, MD  calcium carbonate (OS-CAL - DOSED IN MG OF ELEMENTAL CALCIUM) 1250 (500 Ca) MG tablet Take 1 tablet (1,250 mg total) by mouth daily with breakfast. 06/22/22   Arnetha Courser, MD  Coenzyme Q10 (VITALINE COQ10) 300 MG WAFR Take 300 mg by mouth in the morning.    [provider]  dexlansoprazole (DEXILANT) 60 MG capsule TAKE 1 CAPSULE BY MOUTH ONCE DAILY WITH BREAKFAST 10/17/22   [name], MD  docusate sodium (COLACE) 100 MG capsule Take 1 capsule (100 mg total) by mouth in the morning and at bedtime. 09/04/22   Glade Lloyd, MD  famotidine (PEPCID) 20 MG tablet One after supper Patient taking differently: Take 20 mg by mouth at bedtime. One after supper 06/07/20   [name], MD  Ferric Maltol (ACCRUFER) 30 MG CAPS Take 1 capsule (30 mg total) by mouth in the morning and at bedtime. Patient taking differently: Take 30 mg by mouth in the morning and at bedtime. 09/09/22   [name], MD  FLUoxetine (PROZAC) 40 MG capsule Take 1 capsule (40 mg total) by mouth daily. 04/09/22   [name], MD  Fluticasone-Umeclidin-Vilant (TRELEGY ELLIPTA) 100-62.5-25 MCG/ACT AEPB Inhale 1 puff into the lungs daily. 05/26/22   [name], MD  formoterol (PERFOROMIST) 20 MCG/2ML nebulizer solution Use one vial in the nebulizer twice daily 10/06/22   Kozlow, Alvira Philips, MD  guaiFENesin (MUCINEX) 600 MG 12 hr tablet Take 1 tablet (600 mg total) by mouth 2 (two) times daily. Patient taking differently: Take 600 mg by mouth 2 (two) times daily as needed for cough or to loosen phlegm. 09/04/22   Glade Lloyd, MD  levothyroxine (SYNTHROID) 100 MCG tablet TAKE 1 TABLET BY MOUTH ONCE  DAILY BEFORE BREAKFAST Patient taking differently: Take 100 mcg by mouth daily before breakfast. 12/31/21   [name], MD  methenamine (HIPREX) 1 g tablet Take 1 tablet (1 g total) by mouth 2 (two) times daily with a meal. 08/06/22   [name],  MD  mirabegron ER (MYRBETRIQ) 50 MG TB24 tablet Take 50 mg by mouth daily.    [provider]  mirtazapine (REMERON) 15 MG tablet Take 1 tablet (15 mg total) by mouth at bedtime. 09/09/22   [name], MD  Multiple Vitamin (MULTIVITAMIN WITH MINERALS) TABS tablet Take 1 tablet by mouth in the morning.    [provider]  nystatin (MYCOSTATIN) 100000 UNIT/ML suspension Swish, gargle, and swallow 5 mL after budesonide nebulizer treatment. 10/15/22   Kozlow, Alvira Philips, MD  Probiotic Product (PROBIOTIC PO) Take 1 capsule by mouth  daily.    [provider]  Respiratory Therapy Supplies (FLUTTER) DEVI Use as directed 06/02/17   [name], MD  revefenacin Mikael Spray) 175 MCG/3ML nebulizer solution Use one vial in the nebulizer once daily 10/06/22   Kozlow, Alvira Philips, MD  sodium chloride HYPERTONIC 3 % nebulizer solution Take by nebulization 2 (two) times daily as needed for cough. Dx J44.9 Patient taking differently: Take 4 mLs by nebulization 2 (two) times daily as needed for cough. Dx J44.9 08/29/22   Kalman Shan, MD  tamsulosin (FLOMAX) 0.4 MG CAPS capsule Take 0.4 mg by mouth daily.    [provider]  temazepam (RESTORIL) 15 MG capsule Take 1 capsule (15 mg total) by mouth at bedtime. 07/11/22   [name], MD  torsemide (DEMADEX) 10 MG tablet Take 1 tablet (10 mg total) by mouth daily. 10/17/22   [name], MD  trimethoprim (TRIMPEX) 100 MG tablet Take 100 mg by mouth daily. 11/02/21   [provider]    Physical Exam: Vitals:   10/18/22 0356 10/18/22 0412 10/18/22 0700 10/18/22 0718  BP:   113/64   Pulse:  (!) 111 91 89  Resp:  (!) 28 (!) 24 (!) 23  Temp: 98.2 F (36.8 C)     TempSrc: Oral     SpO2:  92% 96% 95%   Physical Exam Vitals and nursing note reviewed.  Constitutional:      General: She is awake. She is not in acute distress.    Appearance: She is ill-appearing. She is not toxic-appearing.  HENT:     Head:  Normocephalic.     Nose: No rhinorrhea.     Mouth/Throat:     Mouth: Mucous membranes are dry.  Eyes:     General: No scleral icterus.    Pupils: Pupils are equal, round, and reactive to light.  Neck:     Vascular: No JVD.  Cardiovascular:     Rate and Rhythm: Regular rhythm. Tachycardia present.     Heart sounds: S1 normal and S2 normal.  Pulmonary:     Effort: Pulmonary effort is normal.     Breath sounds: Rhonchi and rales present. No wheezing.  Abdominal:     General: Bowel sounds are normal. There is no distension.     Palpations: Abdomen is soft.     Tenderness: There is no abdominal tenderness.  Musculoskeletal:     Cervical back: Neck supple.     Right lower leg: No edema.     Left lower leg: No edema.  Skin:    General: Skin is warm and dry.  Neurological:     Mental Status: She is alert and oriented to person, place, and time.  Psychiatric:        Mood and Affect: Mood normal.        Behavior: Behavior normal. Behavior is cooperative.   Data Reviewed:  Results are pending, will review when available.  TEE IMPRESSIONS:   1. No vegetations seen.   2. The left ventricle has normal function.   3. Right ventricular systolic function is normal. The right ventricular  size is normal.   4. No left atrial/left atrial appendage thrombus was detected.   5. The mitral valve is normal in structure. Trivial mitral valve  regurgitation.   6. The aortic valve is tricuspid. Aortic valve regurgitation is not  visualized.   7. Evidence of atrial level shunting detected by color flow Doppler.  Agitated saline contrast bubble study was  positive with shunting observed  within 3-6 cardiac cycles suggestive of interatrial shunt.   Assessment and Plan: Principal Problem:   Acute on chronic respiratory failure with hypoxia (HCC) In the setting of:   Aspiration pneumonia (HCC) Superimposed on: Recent HCAP (healthcare associated pneumonia) And:   Chronic obstructive pulmonary  disease (HCC) Admit to PCU/inpatient. Continue IV fluids. Continue supplemental oxygen. Continue BiPAP as needed. As needed bronchodilators. Continue cefepime 2 g every 8 hours.   Continue metronidazole 500 mg IVPB q 12 hr. Continue vancomycin per pharmacy. Follow-up blood culture and sensitivity Follow CBC and CMP in a.m.  Active Problems:   Multiple sclerosis (HCC) Having numbness and worsening weakness on her feet. Brain MRI with no new lesions and stable. Spine MRI imaging from 2 days ago with no cord lesions. Consider neurology evaluation if no improvement.    Hypothyroidism Resume levothyroxine once SLP clears for oral intake.    Hyponatremia In the setting of poor oral intake/diuretic use. Continue normal saline infusion for 20 hours Follow-up sodium level in the morning.    GERD (gastroesophageal reflux disease Famotidine 20 mg IVP daily. Switch to oral formulation once cleared for p.o.    OSA on CPAP BiPAP at bedtime.    Hypogammaglobulinemia (HCC) Has resumed IVIG therapy with Dr. Crecencio Mc low. Continue following as scheduled    Normocytic anemia Monitor hematocrit and hemoglobin.    Thrombocytosis In the setting of acute infection and anemia. Follow-up platelet count.    Mild protein malnutrition (HCC) Protein supplementation.    Asymptomatic bacteriuria Has history of frequent UTIs. On nightly trimethoprim home. Will hold while the patient is getting IV antibiotics     Advance Care Planning:   Code Status: Full Code   Consults: Her daughter and her spouse were at bedside.  Family Communication:   Severity of Illness: The appropriate patient status for this patient is INPATIENT. Inpatient status is judged to be reasonable and necessary in order to provide the required intensity of service to ensure the patient's safety. [name] presenting symptoms, physical exam findings, and initial radiographic and laboratory data in the context of their  chronic comorbidities is felt to place them at high risk for further clinical deterioration. Furthermore, it is not anticipated that the patient will be medically stable for discharge from the hospital within 2 midnights of admission.   * I certify that at the point of admission it is my clinical judgment that the patient will require inpatient hospital care spanning beyond 2 midnights from the point of admission due to high intensity of service, high risk for further deterioration and high frequency of surveillance required.*  Author: Bobette Mo, MD 10/18/2022 7:42 AM  For on call review [email or web address].   This document was prepared using Dragon voice recognition software and may contain some unintended transcription errors.

## 2022-10-18 NOTE — Evaluation (Signed)
Clinical/Bedside Swallow Evaluation Patient Details  Name: Kayla [name] MRN: [id number] Date of Birth: [birth year]  Today's Date: 10/18/2022 Time: SLP Start Time (ACUTE ONLY): 1040 SLP Stop Time (ACUTE ONLY): 1055 SLP Time Calculation (min) (ACUTE ONLY): 15 min  Past Medical History:  Past Medical History:  Diagnosis Date   Arthritis    oa   Asthma    Bilateral vocal cord paralysis    Bronchiectasis (HCC)    Cervical cancer (HCC) 2005   Depression    GERD (gastroesophageal reflux disease)    Hiatal hernia    Hypothyroidism    Immunodeficiency (HCC)    Gets IVIG infusions   Insomnia    Multiple sclerosis (HCC) 1996   Osteoporosis    Pneumonia 2024, 2014, 1992   Urine retention    Past Surgical History:  Past Surgical History:  Procedure Laterality Date   ABDOMINAL HYSTERECTOMY  03/25/2003   for cervical cancer, complete   BRONCHIAL BIOPSY  10/26/2020   Procedure: BRONCHIAL BIOPSIES;  Surgeon: Josephine Igo, DO;  [facility]: [facility];  Service: Pulmonary;;   BRONCHIAL BRUSHINGS  10/26/2020   Procedure: BRONCHIAL BRUSHINGS;  Surgeon: Josephine Igo, DO;  [facility];  Service: Pulmonary;;   BRONCHIAL WASHINGS  10/26/2020   Procedure: BRONCHIAL WASHINGS;  Surgeon: Josephine Igo, DO;  [facility];  Service: Pulmonary;;   BRONCHIAL WASHINGS  08/19/2022   Procedure: BRONCHIAL WASHINGS;  Surgeon: Josephine Igo, DO;  [facility];  Service: Cardiopulmonary;;   CATARACT EXTRACTION, BILATERAL Bilateral    lens for cataracts   CONVERSION TO TOTAL HIP Right 09/18/2015   Procedure: CONVERSION OF RIGHT HEMI ARTHROPLASTY TO TOTAL HIP ARTHROPLASTY ACETABULAR REVISON ;  Surgeon: Durene Romans, MD;  Location: [facility];  Service: Orthopedics;  Laterality: Right;   HIP ARTHROPLASTY Right 03/01/2013   Procedure: ARTHROPLASTY BIPOLAR HIP;  Surgeon: Shelda Pal, MD;  Location: [facility];  Service: Orthopedics;  Laterality: Right;   HIP CLOSED REDUCTION  Right 03/12/2013   Procedure: CLOSED REDUCTION HIP;  Surgeon: [name], MD;  [facility];  Service: Orthopedics;  Laterality: Right;   HIP CLOSED REDUCTION Right 03/19/2013   Procedure: CLOSED REDUCTION HIP;  Surgeon: Javier Docker, MD;  [facility];  Service: Orthopedics;  Laterality: Right;   HIP CLOSED REDUCTION Right 11/10/2015   Procedure: CLOSED MANIPULATION HIP;  Surgeon: Samson Frederic, MD;  Location: [facility];  Service: Orthopedics;  Laterality: Right;   HIP CLOSED REDUCTION Right 06/05/2017   Procedure: CLOSED REDUCTION HIP;  Surgeon: Ranee Gosselin, MD;  Location: [facility];  Service: Orthopedics;  Laterality: Right;   SMALL INTESTINE SURGERY     TEE WITHOUT CARDIOVERSION N/A 09/22/2022   Procedure: TRANSESOPHAGEAL ECHOCARDIOGRAM;  Surgeon: Pricilla Riffle, MD;  Location: [facility] INVASIVE CV LAB;  Service: Cardiovascular;  Laterality: N/A;   TOTAL HIP REVISION Right 06/06/2017   Procedure: TOTAL HIP REVISION;  Surgeon: Ranee Gosselin, MD;  [facility]: [facility];  Service: Orthopedics;  Laterality: Right;   VESICOVAGINAL FISTULA CLOSURE W/ TAH  03/25/2003   VIDEO BRONCHOSCOPY N/A 10/26/2020   Procedure: VIDEO BRONCHOSCOPY WITH FLUORO;  Surgeon: Josephine Igo, DO;  [facility];  Service: Pulmonary;  Laterality: N/A;   VIDEO BRONCHOSCOPY Left 08/19/2022   Procedure: VIDEO BRONCHOSCOPY WITH FLUORO; WITH BAL;  Surgeon: Josephine Igo, DO;  [facility];  Service: Cardiopulmonary;  Laterality: Left;   HPI:  Patient is a 72 y.o. female with PMH: multiple sclerosis, immunodeficiency, GERD  and is under palliative care for generally declining physical state and is brought in because of worsening shortness of breath which has been progressive over past three weeks. She is known to SLP from prior admissions. At home, patient was having low grade fevers and husband increased her supplemental oxygen from 2.5 to 3L. Her appetite has been poor and she has not been eating well.  She is full code including intubation if needed.    Assessment / Plan / Recommendation  Clinical Impression  Patient presents with clinical s/s of dysphagia as per this bedside swallow evaluation. During the evaluation, she was drowsy and appeared fatigued, but she was able to maintain adequate alertness for brief periods. She exhibited intermittent coughing in absence of PO's which did appear to cause her some discomfort in chest/abdomen. She drank a couple sips of thin liquids (water) and although no immediate cough, she did exhibit a delayed cough. Patient's spouse told SLP that she does seem to have more frequent coughing after drinking liquids. Patient had an MBS last month, which did show penetration of thin and nectar thick liquids but no aspiration and in addition, patient with prominent cricopharyngeal bar which impacted pharyngeal and upper esophageal clearance of barium PO's. SLP recommending continue NPO but allow ice/water PRN. SLP will follow patient for ability to initiate PO's when she is able to be more awake/alert. SLP Visit Diagnosis: Dysphagia, unspecified (R13.10)    Aspiration Risk  Mild aspiration risk;Moderate aspiration risk    Diet Recommendation NPO;Ice chips PRN after oral care;Free water protocol after oral care    Liquid Administration via: Cup;Straw Medication Administration: Via alternative means Supervision: Patient able to self feed Compensations: Slow rate;Small sips/bites Postural Changes: Seated upright at 90 degrees;Remain upright for at least 30 minutes after po intake    Other  Recommendations Oral Care Recommendations: Oral care BID;Oral care prior to ice chip/H20    Recommendations for follow up therapy are one component of a multi-disciplinary discharge planning process, led by the attending physician.  Recommendations may be updated based on patient status, additional functional criteria and insurance authorization.  Follow up Recommendations Follow  physician's recommendations for discharge plan and follow up therapies      Assistance Recommended at Discharge    Functional Status Assessment Patient has had a recent decline in their functional status and demonstrates the ability to make significant improvements in function in a reasonable and predictable amount of time.  Frequency and Duration min 2x/week  1 week       Prognosis Prognosis for improved oropharyngeal function: Fair Barriers to Reach Goals: Time post onset;Severity of deficits      Swallow Study   General Date of Onset: 10/18/22 HPI: Patient is a 72 y.o. female with PMH: multiple sclerosis, immunodeficiency, GERD and is under palliative care for generally declining physical state and is brought in because of worsening shortness of breath which has been progressive over past three weeks. She is known to SLP from prior admissions. At home, patient was having low grade fevers and husband increased her supplemental oxygen from 2.5 to 3L. Her appetite has been poor and she has not been eating well. She is full code including intubation if needed. Type of Study: Bedside Swallow Evaluation Previous Swallow Assessment: MBS last month Diet Prior to this Study: NPO Temperature Spikes Noted: No Respiratory Status: Nasal cannula History of Recent Intubation: No Behavior/Cognition: Alert;Cooperative;Pleasant mood;Lethargic/Drowsy Oral Cavity Assessment: Dry Oral Care Completed by SLP: Recent completion by staff Oral Cavity -  Dentition: Adequate natural dentition Vision: Functional for self-feeding Self-Feeding Abilities: Able to feed self Patient Positioning: Upright in bed Baseline Vocal Quality: Low vocal intensity Volitional Cough: Strong Volitional Swallow: Able to elicit    Oral/Motor/Sensory Function Overall Oral Motor/Sensory Function: Within functional limits   Ice Chips     Thin Liquid Thin Liquid: Impaired Presentation: Cup;Self Fed Pharyngeal  Phase  Impairments: Cough - Delayed    Nectar Thick     Honey Thick     Puree Puree: Not tested   Solid     Solid: Not tested     Angela Nevin, MA, CCC-SLP Speech Therapy

## 2022-10-18 NOTE — Telephone Encounter (Signed)
Patient currently hospitalized for pneumonia. Closing encounter.

## 2022-10-18 NOTE — ED Triage Notes (Signed)
Pt arrives with family who reports increased shortness of breath and low oxygen over the last day. Pt was recently in the hospital for pneumonia and has been on 3L [place] at home for the last week. Pt hypoxic in the 70s on her 3L. Pt placed on NRB and oxygen 91%. Per husband, pt has hx of MS and has been having weakness in legs and unable to stand on her own.

## 2022-10-18 NOTE — ED Notes (Signed)
RN noticed patient BP was lowering currently at 78/69 RN readjusted BP cuff and rechecked BP and received a reading of 112/64. Patient does have nagging cough, non productive. Asking for water or mouth swabs, MD aware.

## 2022-10-19 DIAGNOSIS — G35 Multiple sclerosis: Secondary | ICD-10-CM

## 2022-10-19 DIAGNOSIS — R7881 Bacteremia: Secondary | ICD-10-CM | POA: Diagnosis not present

## 2022-10-19 DIAGNOSIS — J9621 Acute and chronic respiratory failure with hypoxia: Secondary | ICD-10-CM

## 2022-10-19 LAB — CBC WITH DIFFERENTIAL/PLATELET
Abs Immature Granulocytes: 0.27 10*3/uL — ABNORMAL HIGH (ref 0.00–0.07)
Basophils Absolute: 0 10*3/uL (ref 0.0–0.1)
Basophils Relative: 0 %
Eosinophils Absolute: 0.1 10*3/uL (ref 0.0–0.5)
Eosinophils Relative: 1 %
HCT: 29.6 % — ABNORMAL LOW (ref 36.0–46.0)
Hemoglobin: 9.4 g/dL — ABNORMAL LOW (ref 12.0–15.0)
Immature Granulocytes: 1 %
Lymphocytes Relative: 9 %
Lymphs Abs: 2.1 10*3/uL (ref 0.7–4.0)
MCH: 28.5 pg (ref 26.0–34.0)
MCHC: 31.8 g/dL (ref 30.0–36.0)
MCV: 89.7 fL (ref 80.0–100.0)
Monocytes Absolute: 1.1 10*3/uL — ABNORMAL HIGH (ref 0.1–1.0)
Monocytes Relative: 5 %
Neutro Abs: 19.8 10*3/uL — ABNORMAL HIGH (ref 1.7–7.7)
Neutrophils Relative %: 84 %
Platelets: 567 10*3/uL — ABNORMAL HIGH (ref 150–400)
RBC: 3.3 MIL/uL — ABNORMAL LOW (ref 3.87–5.11)
RDW: 17.1 % — ABNORMAL HIGH (ref 11.5–15.5)
WBC: 23.4 10*3/uL — ABNORMAL HIGH (ref 4.0–10.5)
nRBC: 0 % (ref 0.0–0.2)

## 2022-10-19 LAB — COMPREHENSIVE METABOLIC PANEL WITH GFR
ALT: 17 U/L (ref 0–44)
AST: 20 U/L (ref 15–41)
Albumin: 1.9 g/dL — ABNORMAL LOW (ref 3.5–5.0)
Alkaline Phosphatase: 70 U/L (ref 38–126)
Anion gap: 14 (ref 5–15)
BUN: 16 mg/dL (ref 8–23)
CO2: 21 mmol/L — ABNORMAL LOW (ref 22–32)
Calcium: 8.9 mg/dL (ref 8.9–10.3)
Chloride: 101 mmol/L (ref 98–111)
Creatinine, Ser: 0.77 mg/dL (ref 0.44–1.00)
GFR, Estimated: >60 mL/min (ref >60)
Glucose, Bld: 68 mg/dL — ABNORMAL LOW (ref 70–99)
Potassium: 3.5 mmol/L (ref 3.5–5.1)
Sodium: 136 mmol/L (ref 135–145)
Total Bilirubin: 0.5 mg/dL (ref 0.3–1.2)
Total Protein: 5 g/dL — ABNORMAL LOW (ref 6.5–8.1)

## 2022-10-19 MED ORDER — TAMSULOSIN HCL 0.4 MG PO CAPS: 0.4000 mg | ORAL_CAPSULE | Freq: Every day | ORAL | Status: DC

## 2022-10-19 MED ORDER — ENOXAPARIN SODIUM 40 MG/0.4ML IJ SOSY: 40.0000 mg | PREFILLED_SYRINGE | INTRAMUSCULAR | Status: DC

## 2022-10-19 MED ORDER — MIRABEGRON ER 50 MG PO TB24
50.0000 mg | ORAL_TABLET | Freq: Every day | ORAL | Status: DC
  Administered 2022-10-19 – 2022-10-24 (×6): 50 mg via ORAL
  Filled 2022-10-19 (×6): qty 1

## 2022-10-19 MED ORDER — UMECLIDINIUM BROMIDE 62.5 MCG/ACT IN AEPB
1.0000 | INHALATION_SPRAY | Freq: Every day | RESPIRATORY_TRACT | Status: DC
Start: 2022-10-19 — End: 2022-10-19

## 2022-10-19 MED ORDER — CALCIUM CARBONATE 1250 (500 CA) MG PO TABS
1.0000 | ORAL_TABLET | Freq: Every day | ORAL | Status: DC
  Administered 2022-10-20 – 2022-10-24 (×5): 1250 mg via ORAL
  Filled 2022-10-19 (×6): qty 1

## 2022-10-19 MED ORDER — ALBUTEROL SULFATE (2.5 MG/3ML) 0.083% IN NEBU
2.5000 mg | INHALATION_SOLUTION | Freq: Four times a day (QID) | RESPIRATORY_TRACT | Status: DC
  Administered 2022-10-19 – 2022-10-21 (×7): 2.5 mg via RESPIRATORY_TRACT
  Filled 2022-10-19 (×7): qty 3

## 2022-10-19 MED ORDER — BUDESONIDE 0.5 MG/2ML IN SUSP
0.5000 mg | Freq: Two times a day (BID) | RESPIRATORY_TRACT | Status: DC
  Administered 2022-10-19 – 2022-10-24 (×10): 0.5 mg via RESPIRATORY_TRACT
  Filled 2022-10-19 (×10): qty 2

## 2022-10-19 MED ORDER — COENZYME Q10 300 MG PO WAFR: 300.0000 mg | WAFER | Freq: Every morning | ORAL | Status: DC

## 2022-10-19 MED ORDER — FLUOXETINE HCL 20 MG PO CAPS
40.0000 mg | ORAL_CAPSULE | Freq: Every day | ORAL | Status: DC
  Administered 2022-10-19 – 2022-10-24 (×6): 40 mg via ORAL
  Filled 2022-10-19 (×7): qty 2

## 2022-10-19 MED ORDER — TORSEMIDE 20 MG PO TABS: 10.0000 mg | ORAL_TABLET | Freq: Every day | ORAL | Status: DC

## 2022-10-19 MED ORDER — FLUTICASONE FUROATE-VILANTEROL 100-25 MCG/ACT IN AEPB
1.0000 | INHALATION_SPRAY | Freq: Every day | RESPIRATORY_TRACT | Status: DC
Start: 2022-10-19 — End: 2022-10-19

## 2022-10-19 MED ORDER — CHLORHEXIDINE GLUCONATE CLOTH 2 % EX PADS
6.0000 | MEDICATED_PAD | Freq: Every day | CUTANEOUS | Status: DC
  Administered 2022-10-19 – 2022-10-24 (×6): 6 via TOPICAL

## 2022-10-19 MED ORDER — SODIUM CHLORIDE 0.9 % IV SOLN
2.0000 g | INTRAVENOUS | Status: DC
  Administered 2022-10-19 – 2022-10-20 (×2): 2 g via INTRAVENOUS
  Filled 2022-10-19 (×2): qty 20

## 2022-10-19 MED ORDER — SODIUM CHLORIDE 0.9 % IV SOLN
500.0000 mg | Freq: Every day | INTRAVENOUS | Status: DC
  Administered 2022-10-19: 500 mg via INTRAVENOUS
  Filled 2022-10-19: qty 5

## 2022-10-19 MED ORDER — LEVOTHYROXINE SODIUM 100 MCG PO TABS
100.0000 ug | ORAL_TABLET | Freq: Every day | ORAL | Status: DC
  Administered 2022-10-20 – 2022-10-24 (×5): 100 ug via ORAL
  Filled 2022-10-19 (×5): qty 1

## 2022-10-19 MED ORDER — ENOXAPARIN SODIUM 30 MG/0.3ML IJ SOSY
30.0000 mg | PREFILLED_SYRINGE | INTRAMUSCULAR | Status: DC
  Administered 2022-10-19 – 2022-10-24 (×6): 30 mg via SUBCUTANEOUS
  Filled 2022-10-19 (×6): qty 0.3

## 2022-10-19 MED ORDER — GUAIFENESIN ER 600 MG PO TB12
600.0000 mg | ORAL_TABLET | Freq: Two times a day (BID) | ORAL | Status: DC
  Administered 2022-10-19 – 2022-10-24 (×10): 600 mg via ORAL
  Filled 2022-10-19 (×10): qty 1

## 2022-10-19 NOTE — Progress Notes (Signed)
PHARMACY - PHYSICIAN COMMUNICATION CRITICAL VALUE ALERT - BLOOD CULTURE IDENTIFICATION (BCID)  Kayla [name] is an 72 y.o. female who presented to [facility] on 10/18/2022 with a chief complaint of respiratory distress/pneumonia   Name of physician (or Provider) Contacted: Dr. Joneen Roach  Current antibiotics: Vancomycin, Cefepime, Flagyl  Changes to prescribed antibiotics recommended:  DC Vanco, Cefepime, Flagyl Start Ceftriaxone 2g IV q24h  Results for orders placed or performed during the hospital encounter of 10/18/22  Blood Culture ID Panel (Reflexed) (Collected: 10/18/2022  4:04 AM)  Result Value Ref Range   Enterococcus faecalis NOT DETECTED NOT DETECTED   Enterococcus Faecium NOT DETECTED NOT DETECTED   Listeria monocytogenes NOT DETECTED NOT DETECTED   Staphylococcus species NOT DETECTED NOT DETECTED   Staphylococcus aureus (BCID) NOT DETECTED NOT DETECTED   Staphylococcus epidermidis NOT DETECTED NOT DETECTED   Staphylococcus lugdunensis NOT DETECTED NOT DETECTED   Streptococcus species DETECTED (A) NOT DETECTED   Streptococcus agalactiae NOT DETECTED NOT DETECTED   Streptococcus pneumoniae DETECTED (A) NOT DETECTED   Streptococcus pyogenes NOT DETECTED NOT DETECTED   A.calcoaceticus-baumannii NOT DETECTED NOT DETECTED   Bacteroides fragilis NOT DETECTED NOT DETECTED   Enterobacterales NOT DETECTED NOT DETECTED   Enterobacter cloacae complex NOT DETECTED NOT DETECTED   Escherichia coli NOT DETECTED NOT DETECTED   Klebsiella aerogenes NOT DETECTED NOT DETECTED   Klebsiella oxytoca NOT DETECTED NOT DETECTED   Klebsiella pneumoniae NOT DETECTED NOT DETECTED   Proteus species NOT DETECTED NOT DETECTED   Salmonella species NOT DETECTED NOT DETECTED   Serratia marcescens NOT DETECTED NOT DETECTED   Haemophilus influenzae NOT DETECTED NOT DETECTED   Neisseria meningitidis NOT DETECTED NOT DETECTED   Pseudomonas aeruginosa NOT DETECTED NOT DETECTED   Stenotrophomonas  maltophilia NOT DETECTED NOT DETECTED   Candida albicans NOT DETECTED NOT DETECTED   Candida auris NOT DETECTED NOT DETECTED   Candida glabrata NOT DETECTED NOT DETECTED   Candida krusei NOT DETECTED NOT DETECTED   Candida parapsilosis NOT DETECTED NOT DETECTED   Candida tropicalis NOT DETECTED NOT DETECTED   Cryptococcus neoformans/gattii NOT DETECTED NOT DETECTED    Abran Duke 10/19/2022  12:07 AM

## 2022-10-19 NOTE — Progress Notes (Addendum)
PROGRESS NOTE    Kayla [name]  ZOX:[id number] DOB: 10-25-1950 DOA: 10/18/2022 PCP: [name], MD    Chief Complaint  Patient presents with   Respiratory Distress    Brief Narrative:   Kayla [name] is a 72 y.o. female with medical history significant of Osteoarthritis, asthma, vocal cord paralysis, bronchiectasis, cervical cancer, depression, GERD, hiatal hernia, hypothyroidism, multiple sclerosis, immunodeficiency on IVIG infusions, osteoporosis, urinary retention, recently admitted and discharged due to acute respiratory failure with hypoxia in the setting of community-acquired pneumonia that has been recurrent versus nonresolving.  She also had rhinovirus infection and developed strep pneumonia bacteremia who was brought to the emergency department due to progressive worsening of chronic dyspnea associated with productive cough.  Patient was hypoxic in the 70s on 3 L.  EMS put her on NRB mask and her O2 sat increased to 91%.  The patient also stated that she has had progressively worse weakness in her lower extremities, low-grade fevers, decreased appetite and malaise. No chest pain, palpitations, diaphoresis, PND, orthopnea or pitting edema of the lower extremities.  No abdominal pain, diarrhea, constipation, melena or hematochezia.  No flank pain, dysuria, frequency or hematuria.  No polyuria, polydipsia or polyphagia.   Lab work: CBC showed a white count of 32.2 with 89% neutrophils, hemoglobin 11.3 g/dL platelets 811.  Normal PT, INR and PTT.  Arterial blood gas showed a pH of 7.48, pCO2 of 38, pO2 of 135 mmHg.  Bicarbonate 28.3 and acid-base excess 4.6 mmol/L.  CMP showed a sodium 130 and chloride 91 mmol/L.  Glucose 120 mg deciliter and albumin 3.0 g/dL.  The rest of the CMP measurements were normal.  Lactic acid x 1 normal.  Urinalysis still pending.  Assessment & Plan:   Principal Problem:   Acute on chronic respiratory failure with hypoxia (HCC) Active Problems:    Hypothyroidism   Multiple sclerosis (HCC)   Hyponatremia   GERD (gastroesophageal reflux disease)   OSA on CPAP   Chronic obstructive pulmonary disease (HCC)   HCAP (healthcare-associated pneumonia)   Hypogammaglobulinemia (HCC)   Aspiration pneumonia (HCC)   Normocytic anemia   Thrombocytosis   Mild protein malnutrition (HCC)   Asymptomatic bacteriuria  Acute on chronic respiratory failure with hypoxia (HCC) HCAP (healthcare associated pneumonia) Streptococcus bacteremia -Patient with recurrent admissions due to multifocal pneumonia, initially on broad-spectrum antibiotic coverage to cover for HCAP, blood culture growing Streptococcus, so he will be treated for Streptococcus pneumonia, antibiotics has been narrowed to IV Rocephin-will do surveillance blood cultures in 24 hours -Was encouraged to use incentive spirometry, flutter valve, will start on chest PT -There is a concern for aspiration pneumonia, given a chronic cough and vocal cord paralysis, she has been followed closely by SLP with recent MBS study as an outpatient, she will be advanced to regular diet with thin liquid as discussed with SLP. -Leukocytosis trending down -Continue to trend procalcitonin -Remains on 6 L oxygen this morning, she was recently started on 2 L oxygen as an outpatient last couple weeks  Multiple sclerosis (HCC) Having numbness and worsening weakness on her feet. Brain MRI with no new lesions and stable. Spine MRI imaging from 2 days ago with no cord lesions.   Hypothyroidism Resume levothyroxine once SLP clears for oral intake.   Hyponatremia Volume depletion, continue with gentle IV hydration   GERD (gastroesophageal reflux disease Famotidine 20 mg IVP daily. Switch to oral formulation once cleared for p.o.     OSA on CPAP BiPAP at bedtime.  Hypogammaglobulinemia (HCC) Has resumed IVIG therapy with Dr. Crecencio Mc low. Continue following as scheduled     Normocytic anemia Monitor  hematocrit and hemoglobin.     Thrombocytosis In the setting of acute infection and anemia.      Mild protein malnutrition (HCC) Protein supplementation.     Asymptomatic bacteriuria Has history of frequent UTIs. On nightly trimethoprim home. Will hold while the patient is getting IV antibiotics   DVT prophylaxis: Lovenox Code Status: Full Family Communication: none at bedside Disposition:   Status is: Inpatient    Consultants:  none   Subjective:  Reports cough, congestion, generalized weakness and fatigue  Objective: Vitals:   10/18/22 2317 10/19/22 0313 10/19/22 0800 10/19/22 0900  BP: (!) 117/58 (!) 110/56 123/68   Pulse: 80 78 82   Resp: 20 20 (!) 22   Temp: 98 F (36.7 C) 98.4 F (36.9 C)    TempSrc: Oral Oral    SpO2: 100% 96% 94%   Weight:    42.7 kg    Intake/Output Summary (Last 24 hours) at 10/19/2022 1201 Last data filed at 10/19/2022 6045 Gross per 24 hour  Intake 1182.04 ml  Output 900 ml  Net 282.04 ml   Filed Weights   10/19/22 0900  Weight: 42.7 kg    Examination:  Awake Alert, Oriented X 3, frail and deconditioned Symmetrical Chest wall movement, Rales at lung bases bilaterally RRR,No Gallops,Rubs or new Murmurs, No Parasternal Heave +ve B.Sounds, Abd Soft, No tenderness, No rebound - guarding or rigidity. No Cyanosis, Clubbing or edema, No new Rash or bruise      Data Reviewed: I have personally reviewed following labs and imaging studies  CBC: Recent Labs  Lab 10/18/22 0359 10/19/22 0144  WBC 32.2* 23.4*  NEUTROABS 28.4* 19.8*  HGB 11.3* 9.4*  HCT 35.4* 29.6*  MCV 90.1 89.7  PLT 688* 567*    Basic Metabolic Panel: Recent Labs  Lab 10/18/22 0359 10/19/22 0144  NA 130* 136  K 3.5 3.5  CL 91* 101  CO2 24 21*  GLUCOSE 120* 68*  BUN 19 16  CREATININE 0.74 0.77  CALCIUM 9.5 8.9    GFR: Estimated Creatinine Clearance: 44.1 mL/min (by C-G formula based on SCr of 0.77 mg/dL).  Liver Function Tests: Recent  Labs  Lab 10/18/22 0359 10/19/22 0144  AST 26 20  ALT 23 17  ALKPHOS 96 70  BILITOT 0.5 0.5  PROT 6.9 5.0*  ALBUMIN 3.0* 1.9*    CBG: No results for input(s): "GLUCAP" in the last 168 hours.   Recent Results (from the past 240 hour(s))  Blood Culture (routine x 2)     Status: None (Preliminary result)   Collection Time: 10/18/22  3:59 AM   Specimen: BLOOD RIGHT ARM  Result Value Ref Range Status   Specimen Description   Final    BLOOD RIGHT ARM BOTTLES DRAWN AEROBIC AND ANAEROBIC Performed at [facility], [address] [place], [place] [address]    Special Requests   Final    Blood Culture adequate volume Performed at [facility], [address], [place], [place] [address]    Culture  Setup Time   Final    GRAM POSITIVE COCCI IN BOTH AEROBIC AND ANAEROBIC BOTTLES CRITICAL VALUE NOTED.  VALUE IS CONSISTENT WITH PREVIOUSLY REPORTED AND CALLED VALUE.    Culture   Final    GRAM POSITIVE COCCI IDENTIFICATION TO FOLLOW Performed at [facility], [address], [place],  [place] [address]    Report Status PENDING  Incomplete  Blood Culture (routine x 2)     Status: Abnormal (Preliminary result)   Collection Time: 10/18/22  4:04 AM   Specimen: BLOOD RIGHT ARM  Result Value Ref Range Status   Specimen Description   Final    BLOOD RIGHT ARM BOTTLES DRAWN AEROBIC AND ANAEROBIC Performed at [facility] [address], [place], [place] [address]    Special Requests   Final    Blood Culture adequate volume Performed at [facility], [address], [place], [place] [address]    Culture  Setup Time   Final    GRAM POSITIVE COCCI IN PAIRS IN BOTH AEROBIC AND ANAEROBIC BOTTLES CRITICAL RESULT CALLED TO, READ BACK BY AND VERIFIED WITH: PHARMD J. LEDFORD 10/18/22 @ 2301 BY AB    Culture (A)  Final    STREPTOCOCCUS PNEUMONIAE SUSCEPTIBILITIES TO FOLLOW Performed at [facility],  [address], [place], [place] [address]    Report Status PENDING  Incomplete  Blood Culture ID Panel (Reflexed)     Status: Abnormal   Collection Time: 10/18/22  4:04 AM  Result Value Ref Range Status   Enterococcus faecalis NOT DETECTED NOT DETECTED Final   Enterococcus Faecium NOT DETECTED NOT DETECTED Final   Listeria monocytogenes NOT DETECTED NOT DETECTED Final   Staphylococcus species NOT DETECTED NOT DETECTED Final   Staphylococcus aureus (BCID) NOT DETECTED NOT DETECTED Final   Staphylococcus epidermidis NOT DETECTED NOT DETECTED Final   Staphylococcus lugdunensis NOT DETECTED NOT DETECTED Final   Streptococcus species DETECTED (A) NOT DETECTED Final    Comment: CRITICAL RESULT CALLED TO, READ BACK BY AND VERIFIED WITH: PHARMD J. LEDFORD 10/18/22 @ 2301 BY AB    Streptococcus agalactiae NOT DETECTED NOT DETECTED Final   Streptococcus pneumoniae DETECTED (A) NOT DETECTED Final    Comment: CRITICAL RESULT CALLED TO, READ BACK BY AND VERIFIED WITH: PHARMD J. LEDFORD 10/18/22 @ 2301 BY AB    Streptococcus pyogenes NOT DETECTED NOT DETECTED Final   A.calcoaceticus-baumannii NOT DETECTED NOT DETECTED Final   Bacteroides fragilis NOT DETECTED NOT DETECTED Final   Enterobacterales NOT DETECTED NOT DETECTED Final   Enterobacter cloacae complex NOT DETECTED NOT DETECTED Final   Escherichia coli NOT DETECTED NOT DETECTED Final   Klebsiella aerogenes NOT DETECTED NOT DETECTED Final   Klebsiella oxytoca NOT DETECTED NOT DETECTED Final   Klebsiella pneumoniae NOT DETECTED NOT DETECTED Final   Proteus species NOT DETECTED NOT DETECTED Final   Salmonella species NOT DETECTED NOT DETECTED Final   Serratia marcescens NOT DETECTED NOT DETECTED Final   Haemophilus influenzae NOT DETECTED NOT DETECTED Final   Neisseria meningitidis NOT DETECTED NOT DETECTED Final   Pseudomonas aeruginosa NOT DETECTED NOT DETECTED Final   Stenotrophomonas maltophilia NOT DETECTED NOT DETECTED Final   Candida  albicans NOT DETECTED NOT DETECTED Final   Candida auris NOT DETECTED NOT DETECTED Final   Candida glabrata NOT DETECTED NOT DETECTED Final   Candida krusei NOT DETECTED NOT DETECTED Final   Candida parapsilosis NOT DETECTED NOT DETECTED Final   Candida tropicalis NOT DETECTED NOT DETECTED Final   Cryptococcus neoformans/gattii NOT DETECTED NOT DETECTED Final    Comment: Performed at [facility], [address] [place], [place] [address]         Radiology Studies: DG Chest Port 1 View  Result Date: 10/18/2022 CLINICAL DATA:  Worsening shortness of breath. EXAM: PORTABLE CHEST 1 VIEW  COMPARISON:  September 16, 2022 FINDINGS: The heart size and mediastinal contours are within normal limits. Decreased lung volumes are seen with predominant stable chronic appearing increased interstitial lung markings. Predominantly stable mild to moderate severity bibasilar atelectasis and/or infiltrate is seen. Interval development of a new area of patchy mild to moderate severity infiltrate is seen within the upper right lung. Small, stable bilateral pleural effusions are seen. No pneumothorax is identified. The visualized skeletal structures are unremarkable. IMPRESSION: 1. New area of mild to moderate severity right upper lobe infiltrate. 2. Predominantly stable mild to moderate severity bibasilar atelectasis and/or infiltrate. 3. Small, stable bilateral pleural effusions. Electronically Signed   By: Aram Candela M.D.   On: 10/18/2022 04:28        Scheduled Meds:  enoxaparin (LOVENOX) injection  30 mg Subcutaneous Q24H   Continuous Infusions:  azithromycin 500 mg (10/19/22 0945)   cefTRIAXone (ROCEPHIN)  IV 2 g (10/19/22 0053)   famotidine (PEPCID) IV Stopped (10/18/22 1537)     LOS: 1 day       Huey Bienenstock, MD [facility]   To contact the attending provider between 7A-7P or the covering provider during after hours 7P-7A, please log into the web site [email or web address] and  access using universal [place] password for that web site. If you do not have the password, please call the hospital operator.  10/19/2022, 12:01 PM

## 2022-10-19 NOTE — Plan of Care (Signed)

## 2022-10-19 NOTE — Progress Notes (Signed)
Speech Language Pathology Treatment: Dysphagia  Patient Details Name: Kayla [name] MRN: [id number] DOB: 11/28/1950 Today's Date: 10/19/2022 Time: 8295-6213 SLP Time Calculation (min) (ACUTE ONLY): 15 min  Assessment / Plan / Recommendation Clinical Impression  Patient seen by SLP for skilled treatment focused on dysphagia goals. When SLP entered room, patient with meal tray in front of her and she had consumed majority of items on tray. She continues with chronic cough but did report her cough has been more productive today. Patient continues to deny any changes in her swallowing or her chronic cough. Spouse arrived during session and he confirms this as he did previous date. Patient would like to be able to choose food items she knows she can tolerate from the menu. SLP discussed this with patient and spouse then secure chatted with MD who was in agreement. SLP advanced patient's diet to regular texture solids, thin liquids and she and spouse will select items she can tolerate. SLP to s/o at this time.     HPI HPI: Patient is a 72 y.o. female with PMH: multiple sclerosis, immunodeficiency, GERD and is under palliative care for generally declining physical state and is brought in because of worsening shortness of breath which has been progressive over past three weeks. She is known to SLP from prior admissions. At home, patient was having low grade fevers and husband increased her supplemental oxygen from 2.5 to 3L. Her appetite has been poor and she has not been eating well. She is full code including intubation if needed.      SLP Plan  All goals met      Recommendations for follow up therapy are one component of a multi-disciplinary discharge planning process, led by the attending physician.  Recommendations may be updated based on patient status, additional functional criteria and insurance authorization.    Recommendations  Diet recommendations: Regular;Thin liquid Liquids provided  via: Cup;Straw Medication Administration: Other (Comment) (as tolerated) Supervision: Patient able to self feed Compensations: Slow rate;Small sips/bites Postural Changes and/or Swallow Maneuvers: Seated upright 90 degrees                  Oral care BID;Patient independent with oral care   None Dysphagia, unspecified (R13.10)     All goals met     Angela Nevin, MA, CCC-SLP Speech Therapy

## 2022-10-19 NOTE — Progress Notes (Signed)
PT Cancellation Note  Patient Details Name: ROMIKA CREWS MRN: [id number] DOB: [birth year]   Cancelled Treatment:    Reason Eval/Treat Not Completed: Other (comment) Pt declining due to dyspnea and coughing.  Lillia Pauls, PT, DPT Acute Rehabilitation Services Office [phone]    Norval Morton 10/19/2022, 1:39 PM

## 2022-10-19 NOTE — Progress Notes (Signed)
   10/19/22 2000  BiPAP/CPAP/SIPAP  BiPAP/CPAP/SIPAP Pt Type Adult (No machine in room)  Reason BIPAP/CPAP not in use Non-compliant (Refused)

## 2022-10-20 DIAGNOSIS — E871 Hypo-osmolality and hyponatremia: Secondary | ICD-10-CM | POA: Diagnosis not present

## 2022-10-20 DIAGNOSIS — J13 Pneumonia due to Streptococcus pneumoniae: Secondary | ICD-10-CM | POA: Diagnosis not present

## 2022-10-20 DIAGNOSIS — J9621 Acute and chronic respiratory failure with hypoxia: Secondary | ICD-10-CM | POA: Diagnosis not present

## 2022-10-20 DIAGNOSIS — G35 Multiple sclerosis: Secondary | ICD-10-CM | POA: Diagnosis not present

## 2022-10-20 LAB — EXPECTORATED SPUTUM ASSESSMENT W GRAM STAIN, RFLX TO RESP C

## 2022-10-20 LAB — CULTURE, RESPIRATORY W GRAM STAIN

## 2022-10-20 MED ORDER — DOCUSATE SODIUM 100 MG PO CAPS
200.0000 mg | ORAL_CAPSULE | Freq: Two times a day (BID) | ORAL | Status: DC
  Administered 2022-10-20 – 2022-10-24 (×9): 200 mg via ORAL
  Filled 2022-10-20 (×9): qty 2

## 2022-10-20 MED ORDER — PNEUMOCOCCAL 20-VAL CONJ VACC 0.5 ML IM SUSY
0.5000 mL | PREFILLED_SYRINGE | INTRAMUSCULAR | Status: AC
  Administered 2022-10-22: 0.5 mL via INTRAMUSCULAR
  Filled 2022-10-20 (×2): qty 0.5

## 2022-10-20 MED ORDER — POLYETHYLENE GLYCOL 3350 17 G PO PACK: 17.0000 g | PACK | Freq: Every day | ORAL | Status: DC | PRN

## 2022-10-20 MED ORDER — ADULT MULTIVITAMIN W/MINERALS CH
1.0000 | ORAL_TABLET | Freq: Every day | ORAL | Status: DC
  Administered 2022-10-20 – 2022-10-24 (×5): 1 via ORAL
  Filled 2022-10-20 (×5): qty 1

## 2022-10-20 MED ORDER — PANTOPRAZOLE SODIUM 40 MG PO TBEC
40.0000 mg | DELAYED_RELEASE_TABLET | Freq: Every day | ORAL | Status: DC
  Administered 2022-10-20 – 2022-10-24 (×5): 40 mg via ORAL
  Filled 2022-10-20 (×6): qty 1

## 2022-10-20 MED ORDER — TEMAZEPAM 7.5 MG PO CAPS
15.0000 mg | ORAL_CAPSULE | ORAL | Status: DC
  Administered 2022-10-20 – 2022-10-23 (×4): 15 mg via ORAL
  Filled 2022-10-20 (×4): qty 2

## 2022-10-20 MED ORDER — AMOXICILLIN-POT CLAVULANATE 875-125 MG PO TABS: 1.0000 | ORAL_TABLET | ORAL | Status: DC

## 2022-10-20 MED ORDER — AMOXICILLIN-POT CLAVULANATE 875-125 MG PO TABS
1.0000 | ORAL_TABLET | Freq: Two times a day (BID) | ORAL | Status: DC
  Administered 2022-10-20 – 2022-10-24 (×8): 1 via ORAL
  Filled 2022-10-20 (×8): qty 1

## 2022-10-20 MED ORDER — POTASSIUM CHLORIDE CRYS ER 20 MEQ PO TBCR
40.0000 meq | EXTENDED_RELEASE_TABLET | Freq: Once | ORAL | Status: AC
  Administered 2022-10-20: 40 meq via ORAL
  Filled 2022-10-20: qty 2

## 2022-10-20 MED ORDER — BOOST / RESOURCE BREEZE PO LIQD CUSTOM
1.0000 | Freq: Three times a day (TID) | ORAL | Status: DC
  Administered 2022-10-20 – 2022-10-24 (×7): 1 via ORAL

## 2022-10-20 MED ORDER — PROSOURCE PLUS PO LIQD
30.0000 mL | Freq: Three times a day (TID) | ORAL | Status: DC
  Administered 2022-10-20 – 2022-10-24 (×5): 30 mL via ORAL
  Filled 2022-10-20 (×7): qty 30

## 2022-10-20 NOTE — Progress Notes (Addendum)
PROGRESS NOTE    Kayla [name]  ZOX:[id number] DOB: 11/23/1950 DOA: 10/18/2022 PCP: [name], MD    Chief Complaint  Patient presents with   Respiratory Distress    Brief Narrative:   Kayla [name] is a 72 y.o. female with medical history significant of Osteoarthritis, asthma, vocal cord paralysis, bronchiectasis, cervical cancer, depression, GERD, hiatal hernia, hypothyroidism, multiple sclerosis, immunodeficiency on IVIG infusions, osteoporosis, urinary retention, recently admitted and discharged due to acute respiratory failure with hypoxia in the setting of community-acquired pneumonia and Streptococcus bacteremia, she was discharged home, she was started on oxygen by her PCP given you noted hypoxia as an outpatient, she presents with worsening hypoxia, weakness, cough, his workup significant for hypoxia, and pneumonia, as well.  Her blood culture is growing strep pneumonia as well.    Assessment & Plan:   Principal Problem:   Acute on chronic respiratory failure with hypoxia (HCC) Active Problems:   Hypothyroidism   Multiple sclerosis (HCC)   Hyponatremia   GERD (gastroesophageal reflux disease)   OSA on CPAP   Chronic obstructive pulmonary disease (HCC)   HCAP (healthcare-associated pneumonia)   Hypogammaglobulinemia (HCC)   Aspiration pneumonia (HCC)   Normocytic anemia   Thrombocytosis   Mild protein malnutrition (HCC)   Asymptomatic bacteriuria  Acute on chronic respiratory failure with hypoxia (HCC) HCAP (healthcare associated pneumonia) Streptococcus bacteremia -Patient with recurrent admissions due to multifocal pneumonia, initially on broad-spectrum antibiotic coverage to cover for HCAP, blood culture growing Streptococcus, so he will be treated for Streptococcus pneumonia, antibiotics has been narrowed to IV Rocephin -Following surveillance blood cultures sent this morning -Was encouraged to use incentive spirometry, flutter valve -There is a concern  for aspiration pneumonia, given a chronic cough and vocal cord paralysis, she has been followed closely by SLP with recent MBS study as an outpatient, sheis  advanced to regular diet with thin liquid as discussed with SLP. -Leukocytosis trending down -Continue to trend procalcitonin -Remains on 6 L oxygen this morning, she was recently started on 2 L oxygen as an outpatient last couple weeks, she is high risk for decompensation in the setting of her immunocompromise status . -This is her second admission with bacteremia and pneumonia, as well she is immunocompromise, so we will request ID input.    Multiple sclerosis (HCC) Having numbness and worsening weakness on her feet. Brain MRI with no new lesions and stable. Spine MRI imaging from 2 days ago with no cord lesions.   Hypothyroidism Continue  levothyroxine.   Hyponatremia Volume depletion, continue with gentle IV hydration   GERD (gastroesophageal reflux disease Continue with protonix Switch to oral formulation once cleared for p.o.   OSA on CPAP BiPAP at bedtime.   Hypogammaglobulinemia (HCC) On  IVIG therapy with Dr. Sharyn Lull as outpatient      Normocytic anemia Monitor hematocrit and hemoglobin.     Thrombocytosis In the setting of acute infection and anemia.     protein malnutrition (HCC) Protein supplementation.     Asymptomatic bacteriuria Has history of frequent UTIs. On nightly trimethoprim home. Will hold while the patient is getting IV antibiotics   DVT prophylaxis: Lovenox Code Status: Full Family Communication: none at bedside Disposition:   Status is: Inpatient    Consultants:  none   Subjective:  Reports cough, congestion, generalized weakness and fatigue, reports she has been compliant with his flutter valve, she will provided with incentive spirometer today.  She denies any worsening or improvement of her lower extremity decreased  sensation and weakness  Objective: Vitals:   10/19/22 2100  10/20/22 0000 10/20/22 0400 10/20/22 0732  BP: (!) 116/95 (!) 112/48 119/64   Pulse:    86  Resp: (!) 24 20 17    Temp: 98.4 F (36.9 C) 99.2 F (37.3 C) 98.2 F (36.8 C)   TempSrc: Axillary Oral Oral   SpO2: 92% 93% 95%   Weight:       No intake or output data in the 24 hours ending 10/20/22 0956  Filed Weights   10/19/22 0900  Weight: 42.7 kg    Examination:  Awake Alert, Oriented X 3, frail, deconditioned Symmetrical Chest wall movement, diminished air entry at the bases with Rales RRR,No Gallops,Rubs or new Murmurs, No Parasternal Heave +ve B.Sounds, Abd Soft, No tenderness, No rebound - guarding or rigidity. No Cyanosis, Clubbing or edema, No new Rash or bruise       Data Reviewed: I have personally reviewed following labs and imaging studies  CBC: Recent Labs  Lab 10/18/22 0359 10/19/22 0144 10/20/22 0537  WBC 32.2* 23.4* 14.9*  NEUTROABS 28.4* 19.8* 11.8*  HGB 11.3* 9.4* 9.2*  HCT 35.4* 29.6* 29.2*  MCV 90.1 89.7 92.1  PLT 688* 567* 640*    Basic Metabolic Panel: Recent Labs  Lab 10/18/22 0359 10/19/22 0144 10/20/22 0537  NA 130* 136 133*  K 3.5 3.5 3.5  CL 91* 101 101  CO2 24 21* 24  GLUCOSE 120* 68* 92  BUN 19 16 7*  CREATININE 0.74 0.77 0.54  CALCIUM 9.5 8.9 8.8*    GFR: Estimated Creatinine Clearance: 44.1 mL/min (by C-G formula based on SCr of 0.54 mg/dL).  Liver Function Tests: Recent Labs  Lab 10/18/22 0359 10/19/22 0144 10/20/22 0537  AST 26 20 18   ALT 23 17 15   ALKPHOS 96 70 62  BILITOT 0.5 0.5 0.4  PROT 6.9 5.0* 4.8*  ALBUMIN 3.0* 1.9* 1.7*    CBG: No results for input(s): "GLUCAP" in the last 168 hours.   Recent Results (from the past 240 hour(s))  Blood Culture (routine x 2)     Status: Abnormal (Preliminary result)   Collection Time: 10/18/22  3:59 AM   Specimen: BLOOD RIGHT ARM  Result Value Ref Range Status   Specimen Description   Final    BLOOD RIGHT ARM BOTTLES DRAWN AEROBIC AND ANAEROBIC Performed at  [facility], [address], [place], [place] [address]    Special Requests   Final    Blood Culture adequate volume Performed at [facility], [address] [place], [place] [address]    Culture  Setup Time   Final    GRAM POSITIVE COCCI IN BOTH AEROBIC AND ANAEROBIC BOTTLES CRITICAL VALUE NOTED.  VALUE IS CONSISTENT WITH PREVIOUSLY REPORTED AND CALLED VALUE.    Culture (A)  Final    STREPTOCOCCUS PNEUMONIAE SUSCEPTIBILITIES PERFORMED ON PREVIOUS CULTURE WITHIN THE LAST 5 DAYS. Performed at [facility], [address] [place], [place] [address]    Report Status PENDING  Incomplete  Blood Culture (routine x 2)     Status: Abnormal (Preliminary result)   Collection Time: 10/18/22  4:04 AM   Specimen: BLOOD RIGHT ARM  Result Value Ref Range Status   Specimen Description   Final    BLOOD RIGHT ARM BOTTLES DRAWN AEROBIC AND ANAEROBIC Performed at [facility], [address], [place], [place] [address]    Special Requests   Final    Blood Culture  adequate volume Performed at [facility], [address] [place], [place] [address]    Culture  Setup Time   Final    GRAM POSITIVE COCCI IN PAIRS IN BOTH AEROBIC AND ANAEROBIC BOTTLES CRITICAL RESULT CALLED TO, READ BACK BY AND VERIFIED WITH: PHARMD J. LEDFORD 10/18/22 @ 2301 BY AB Performed at [facility], [address] [place], [place] [address]    Culture STREPTOCOCCUS PNEUMONIAE (A)  Final   Report Status PENDING  Incomplete   Organism ID, Bacteria STREPTOCOCCUS PNEUMONIAE  Final      Susceptibility   Streptococcus pneumoniae - MIC*    ERYTHROMYCIN >=8 RESISTANT Resistant     LEVOFLOXACIN <=0.25 SENSITIVE Sensitive     VANCOMYCIN 0.5 SENSITIVE Sensitive     PENICILLIN (meningitis) 1 RESISTANT Resistant     PENO - penicillin 1      PENICILLIN (non-meningitis) 1 SENSITIVE Sensitive     PENICILLIN (oral) 1 INTERMEDIATE Intermediate      CEFTRIAXONE (non-meningitis) 1 SENSITIVE Sensitive     CEFTRIAXONE (meningitis) 1 INTERMEDIATE Intermediate     * STREPTOCOCCUS PNEUMONIAE  Blood Culture ID Panel (Reflexed)     Status: Abnormal   Collection Time: 10/18/22  4:04 AM  Result Value Ref Range Status   Enterococcus faecalis NOT DETECTED NOT DETECTED Final   Enterococcus Faecium NOT DETECTED NOT DETECTED Final   Listeria monocytogenes NOT DETECTED NOT DETECTED Final   Staphylococcus species NOT DETECTED NOT DETECTED Final   Staphylococcus aureus (BCID) NOT DETECTED NOT DETECTED Final   Staphylococcus epidermidis NOT DETECTED NOT DETECTED Final   Staphylococcus lugdunensis NOT DETECTED NOT DETECTED Final   Streptococcus species DETECTED (A) NOT DETECTED Final    Comment: CRITICAL RESULT CALLED TO, READ BACK BY AND VERIFIED WITH: PHARMD J. LEDFORD 10/18/22 @ 2301 BY AB    Streptococcus agalactiae NOT DETECTED NOT DETECTED Final   Streptococcus pneumoniae DETECTED (A) NOT DETECTED Final    Comment: CRITICAL RESULT CALLED TO, READ BACK BY AND VERIFIED WITH: PHARMD J. LEDFORD 10/18/22 @ 2301 BY AB    Streptococcus pyogenes NOT DETECTED NOT DETECTED Final   A.calcoaceticus-baumannii NOT DETECTED NOT DETECTED Final   Bacteroides fragilis NOT DETECTED NOT DETECTED Final   Enterobacterales NOT DETECTED NOT DETECTED Final   Enterobacter cloacae complex NOT DETECTED NOT DETECTED Final   Escherichia coli NOT DETECTED NOT DETECTED Final   Klebsiella aerogenes NOT DETECTED NOT DETECTED Final   Klebsiella oxytoca NOT DETECTED NOT DETECTED Final   Klebsiella pneumoniae NOT DETECTED NOT DETECTED Final   Proteus species NOT DETECTED NOT DETECTED Final   Salmonella species NOT DETECTED NOT DETECTED Final   Serratia marcescens NOT DETECTED NOT DETECTED Final   Haemophilus influenzae NOT DETECTED NOT DETECTED Final   Neisseria meningitidis NOT DETECTED NOT DETECTED Final   Pseudomonas aeruginosa NOT DETECTED NOT DETECTED Final    Stenotrophomonas maltophilia NOT DETECTED NOT DETECTED Final   Candida albicans NOT DETECTED NOT DETECTED Final   Candida auris NOT DETECTED NOT DETECTED Final   Candida glabrata NOT DETECTED NOT DETECTED Final   Candida krusei NOT DETECTED NOT DETECTED Final   Candida parapsilosis NOT DETECTED NOT DETECTED Final   Candida tropicalis NOT DETECTED NOT DETECTED Final   Cryptococcus neoformans/gattii NOT DETECTED NOT DETECTED Final    Comment: Performed at [facility], [address], [place], [place] [address]         Radiology Studies: No results found.      Scheduled Meds:  albuterol  2.5 mg Nebulization QID   budesonide (PULMICORT) nebulizer solution  0.5 mg Nebulization BID   calcium carbonate  1 tablet Oral Q breakfast   Chlorhexidine Gluconate Cloth  6 each Topical Daily   enoxaparin (LOVENOX) injection  30 mg Subcutaneous Q24H   FLUoxetine  40 mg Oral Daily   guaiFENesin  600 mg Oral BID   levothyroxine  100 mcg Oral QAC breakfast   mirabegron ER  50 mg Oral Daily   Continuous Infusions:  cefTRIAXone (ROCEPHIN)  IV 2 g (10/20/22 0009)   famotidine (PEPCID) IV 20 mg (10/19/22 1300)     LOS: 2 days       Huey Bienenstock, MD [facility]   To contact the attending provider between 7A-7P or the covering provider during after hours 7P-7A, please log into the web site [email or web address] and access using universal [place] password for that web site. If you do not have the password, please call the hospital operator.  10/20/2022, 9:56 AM

## 2022-10-20 NOTE — Discharge Instructions (Signed)

## 2022-10-20 NOTE — Progress Notes (Signed)
Initial Nutrition Assessment  DOCUMENTATION CODES:   Severe malnutrition in context of chronic illness, Underweight  INTERVENTION:  Boost Breeze po TID, each supplement provides 250 kcal and 9 grams of protein 30 ml ProSource Plus TID, each supplement provides 100 kcals and 15 grams protein.  MVI with minerals daily "High Calorie, High Protein Nutrition Therapy" handout added to AVS Reached out to MD regarding bowel regimen  NUTRITION DIAGNOSIS:   Severe Malnutrition related to chronic illness (multiple sclerosis, immunodeficiency, recurrent CAP) as evidenced by severe fat depletion, severe muscle depletion, percent weight loss.  GOAL:   Patient will meet greater than or equal to 90% of their needs  MONITOR:   PO intake, Supplement acceptance, Labs, Weight trends  REASON FOR ASSESSMENT:   Consult Assessment of nutrition requirement/status  ASSESSMENT:   Pt recently admitted with acute respiratory failure with hypoxia in the setting of CAP and streptococcus bacteremia. Returns with worsening hypoxia, weakness, and cough. PMH significant for osteoarthritis, vocal cord paralysis, bronchiectasis, cervical cancer, depression, GERD, hiatal hernia, hypothyroidism, multiple sclerosis, immunodeficiency on IVIG infusions.  7/28 - SLP advanced diet to regular, thin liquids  Pt sitting up in chair with husband present at bedside who assisted in providing nutrition related history as pt with continued coughing during visit.   Her appetite has been quite poor within the last 5-6 weeks. She has a cough r/t paralysis of vocal cord which causes her difficulty with consuming adequate nutrition but has been exacerbated over the last several weeks d/t acute onset of respiratory failure.   She is currently able to self feed but her husband mentions that she has had difficulty being able to lift items to her mouth, such as nutrition supplements. They also report new lower extremity muscle weakness  which has significantly impacted her mobility.   She is unable to tolerate milk type nutrition supplements d/t thick mucus. She has been trying to consume Ensure Clear which her husband brought to the hospital. They agree to order for Boost Breeze during admission to save their home supply in addition to trying ProSource.   At home she takes a MVI, calcium, Vitamin D and iron. She endorses chronic constipation for which she takes colace. It is not uncommon for her to go 4-5 days without a BM.   He reports that since the beginning of March pt has lost about 20 lbs. Per review of documented weights on file, pt appears to have had a weight loss of 15.4% since 03/31 which is clinically significant for time frame. Suspect current weight to be stated versus actual measured weight.   Medications: calcium carbonate once daily, protonix, IV abx  Labs: sodium 133, BUN 7  NUTRITION - FOCUSED PHYSICAL EXAM:  Flowsheet Row Most Recent Value  Orbital Region Severe depletion  Upper Arm Region Severe depletion  Thoracic and Lumbar Region Severe depletion  Buccal Region Severe depletion  Temple Region Severe depletion  Clavicle Bone Region Severe depletion  Clavicle and Acromion Bone Region Severe depletion  Scapular Bone Region Severe depletion  Dorsal Hand Severe depletion  Patellar Region Severe depletion  Anterior Thigh Region Severe depletion  Posterior Calf Region Severe depletion  Edema (RD Assessment) None  Hair Reviewed  Eyes Reviewed  Mouth Other (Comment)  [brown coating]  Skin Reviewed  Nails Reviewed       Diet Order:   Diet Order             Diet regular Room service appropriate? Yes; Fluid consistency: Thin  Diet effective now                   EDUCATION NEEDS:   Education needs have been addressed  Skin:  Skin Assessment: Reviewed RN Assessment  Last BM:  7/26 per pt  Height:   Ht Readings from Last 1 Encounters:  09/30/22 5\' 3"  (1.6 m)    Weight:   Wt  Readings from Last 1 Encounters:  10/19/22 42.7 kg   BMI:  Body mass index is 16.68 kg/m.  Estimated Nutritional Needs:   Kcal:  1300-1500  Protein:  65-80g  Fluid:  1.3-1.5L  Drusilla Kanner, RDN, LDN Clinical Nutrition

## 2022-10-20 NOTE — Evaluation (Signed)
Physical Therapy Evaluation Patient Details Name: Kayla [name] MRN: [id number] DOB: [birth year] Today's Date: 10/20/2022  History of Present Illness  72 y.o. female brought in 10/18/22 because of worsening shortness of breath which has been progressive over past three weeks. Aspiration pna; PMH: OA, cervical cancer, COPD, asthma, MS, PNA, OSA, hypothyroidism, osteoporosis, R THA, R hip closed reduction manipulation; vocal cord paralysis  Clinical Impression   Pt admitted secondary to problem above with deficits below. PTA patient was living at home with spouse with 4 steps to enter home.  Pt currently requires minguard assist for mobility due to lines and for safety due to risk of falling. Unable to progress ambulation distance due to O2 tank not working. Sats >95% on 6L throughout. Anticipate patient will benefit from PT to address problems listed below.Will continue to follow acutely to maximize functional mobility independence and safety.           If plan is discharge home, recommend the following: A little help with walking and/or transfers;Assistance with cooking/housework;Help with stairs or ramp for entrance   Can travel by private vehicle        Equipment Recommendations None recommended by PT  Recommendations for Other Services  OT consult    Functional Status Assessment Patient has had a recent decline in their functional status and demonstrates the ability to make significant improvements in function in a reasonable and predictable amount of time.     Precautions / Restrictions Precautions Precautions: Fall Restrictions Weight Bearing Restrictions: No      Mobility  Bed Mobility Overal bed mobility: Modified Independent             General bed mobility comments: HOB elevated 20 plus use of rail    Transfers Overall transfer level: Needs assistance Equipment used: Rolling walker (2 wheels) Transfers: Sit to/from Stand Sit to Stand: Min guard            General transfer comment: pt uses one hand on RW and one to push off bed    Ambulation/Gait Ambulation/Gait assistance: Min guard Gait Distance (Feet): 2 Feet Assistive device: Rolling walker (2 wheels) Gait Pattern/deviations: Step-to pattern, Decreased stride length, Decreased dorsiflexion - right, Steppage       General Gait Details: unable to progress ambualtion due to O2 tank not working (no flow)  Stairs            Wheelchair Mobility     Tilt Bed    Modified Rankin (Stroke Patients Only)       Balance Overall balance assessment: Modified Independent                                           Pertinent Vitals/Pain Pain Assessment Pain Assessment: No/denies pain    Home Living Family/patient expects to be discharged to:: Private residence Living Arrangements: Spouse/significant other Available Help at Discharge: Family;Available 24 hours/day Type of Home: House Home Access: Stairs to enter Entrance Stairs-Rails: Doctor, general practice of Steps: 4   Home Layout: One level Home Equipment: Agricultural consultant (2 wheels);Shower seat;Grab bars - tub/shower;Rollator (4 wheels);Transport chair      Prior Function Prior Level of Function : Independent/Modified Independent                     Hand Dominance   Dominant Hand: Right    Extremity/Trunk Assessment   Upper Extremity  Assessment Upper Extremity Assessment: Defer to OT evaluation    Lower Extremity Assessment Lower Extremity Assessment: Generalized weakness (rt foot drop)    Cervical / Trunk Assessment Cervical / Trunk Assessment: Normal  Communication   Communication: Expressive difficulties (difficult to understand at times)  Cognition Arousal/Alertness: Awake/alert Behavior During Therapy: WFL for tasks assessed/performed Overall Cognitive Status: Within Functional Limits for tasks assessed                                           General Comments General comments (skin integrity, edema, etc.): on 6L O2 with sats >95% throughout    Exercises     Assessment/Plan    PT Assessment Patient needs continued PT services  PT Problem List Decreased strength;Decreased activity tolerance;Decreased balance;Decreased mobility;Cardiopulmonary status limiting activity       PT Treatment Interventions DME instruction;Gait training;Stair training;Functional mobility training;Therapeutic activities;Therapeutic exercise;Patient/family education    PT Goals (Current goals can be found in the Care Plan section)  Acute Rehab PT Goals Patient Stated Goal: return home PT Goal Formulation: With patient Time For Goal Achievement: 11/03/22 Potential to Achieve Goals: Good    Frequency Min 1X/week     Co-evaluation               AM-PAC PT "6 Clicks" Mobility  Outcome Measure Help needed turning from your back to your side while in a flat bed without using bedrails?: None Help needed moving from lying on your back to sitting on the side of a flat bed without using bedrails?: A Little Help needed moving to and from a bed to a chair (including a wheelchair)?: A Little Help needed standing up from a chair using your arms (e.g., wheelchair or bedside chair)?: A Little Help needed to walk in hospital room?: A Little Help needed climbing 3-5 steps with a railing? : A Lot 6 Click Score: 18    End of Session Equipment Utilized During Treatment: Oxygen Activity Tolerance: Patient tolerated treatment well Patient left: in chair;with call bell/phone within reach;with chair alarm set Nurse Communication: Mobility status PT Visit Diagnosis: Other abnormalities of gait and mobility (R26.89)    Time: 9811-9147 PT Time Calculation (min) (ACUTE ONLY): 18 min   Charges:   PT Evaluation $PT Eval Low Complexity: 1 Low   PT General Charges $$ ACUTE PT VISIT: 1 Visit          Jerolyn Center, PT Acute Rehabilitation Services  Office  [phone]   Zena Amos 10/20/2022, 9:45 AM

## 2022-10-20 NOTE — TOC Initial Note (Signed)
Transition of Care [facility]) - Initial/Assessment Note    Patient Details  Name: Kayla [name] MRN: [id number] Date of Birth: [birth year]  Transition of Care [facility]) CM/SW Contact:    Gordy Clement, RN Phone Number: 10/20/2022, 3:38 PM  Clinical Narrative:                  CM met with Patient and her Husband in the room.  Patient from home. Patient has rolling walker, shower chair,transfer chair, bedside commode. Patient on home O2 from [employer]  Was on 2 L at baseline. Home concentrator goes to 6L . May need larger concentrator if  requirement increases. Patient currently receives Palliative services from [facility]. Liaison has been notified. Home Health SN and PT ordered for extra support in the home.  [facility] will provide services- Patient has used several times in the past and would like to use again.   TOC will continue to follow patient for any additional discharge needs        Expected Discharge Plan: Home w Home Health Services Barriers to Discharge: Continued Medical Work up   Patient Goals and CMS Choice Patient states their goals for this hospitalization and ongoing recovery are:: Get out of the hospital CMS Medicare.gov Compare Post Acute Care list provided to:: Other (Comment Required) (N/A) Choice offered to / list presented to : NA      Expected Discharge Plan and Services   Discharge Planning Services: CM Consult Post Acute Care Choice: NA (TBD) Living arrangements for the past 2 months: Single Family Home                 DME Arranged: N/A DME Agency: [employer] (Home O2)       HH Arranged: RN, PT HH Agency: [facility] Date HH Agency Contacted: 10/20/22 Time HH Agency Contacted: 1528 Representative spoke with at [facility] Agency: Hassel Neth - liaison  Prior Living Arrangements/Services Living arrangements for the past 2 months: Single Family Home Lives with:: Spouse Patient language and need for interpreter reviewed:: Yes Do you feel  safe going back to the place where you live?: Yes      Need for Family Participation in Patient Care: Yes (Comment) Care giver support system in place?: Yes (comment) Current home services: DME, Other (comment) (Palliative with [facility]) Criminal Activity/Legal Involvement Pertinent to Current Situation/Hospitalization: No - Comment as needed  Activities of Daily Living      Permission Sought/Granted Permission sought to share information with : Case Manager, Other (comment) (Home Health) Permission granted to share information with : Yes, Verbal Permission Granted     Permission granted to share info w AGENCY: [facility]        Emotional Assessment Appearance:: Appears stated age Attitude/Demeanor/Rapport: Engaged Affect (typically observed): Quiet, Restless Orientation: : Oriented to Self, Oriented to Place, Oriented to  Time, Oriented to Situation Alcohol / Substance Use: Not Applicable Psych Involvement: No (comment)  Admission diagnosis:  Hyponatremia [E87.1] Aspiration pneumonia (HCC) [J69.0] Acute respiratory failure with hypoxia (HCC) [J96.01] Normochromic normocytic anemia [D64.9] Elevated random blood glucose level [R73.09] Community acquired pneumonia, unspecified laterality [J18.9] Patient Active Problem List   Diagnosis Date Noted   Aspiration pneumonia (HCC) 10/18/2022   Normocytic anemia 10/18/2022   Thrombocytosis 10/18/2022   Mild protein malnutrition (HCC) 10/18/2022   Asymptomatic bacteriuria 10/18/2022   Chronic respiratory failure with hypoxia (HCC) 09/29/2022   Pneumococcal bacteremia 09/22/2022   Hypogammaglobulinemia (HCC) 09/22/2022   HCAP (healthcare-associated pneumonia)  09/16/2022   Palliative care by specialist 09/03/2022   Goals of care, counseling/discussion 09/03/2022   Protein-calorie malnutrition, severe 09/02/2022   Pulmonary infiltrates 08/19/2022   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)  07/08/2022   Bilateral pubic rami fractures (HCC) 06/23/2022   GERD (gastroesophageal reflux disease) 06/20/2022   Acetabular fracture (HCC) 06/19/2022   Chronic obstructive pulmonary disease (HCC) 09/24/2021   Pulmonary mycobacterial infection (HCC) 09/24/2021   Bilateral vocal cord paralysis 09/24/2021   UTI (urinary tract infection) 05/24/2021   COPD (chronic obstructive pulmonary disease) (HCC)    Iron deficiency anemia secondary to inadequate dietary iron intake 12/20/2020   Purple toe syndrome of both feet (HCC) 07/26/2020   Cough variant asthma vs vcd  related to MS  11/10/2016   Positional sleep apnea 09/10/2016   Periodic limb movements of sleep 09/10/2016   OSA on CPAP 02/08/2016   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/21/2013   Constipation 02/26/2013   Acute on chronic respiratory failure with hypoxia (HCC) 02/24/2013   Hyponatremia 05/20/2012   Post-menopausal osteoporosis 09/17/2007   Hypothyroidism 04/01/2007   Multiple sclerosis (HCC) 04/01/2007   PCP:  [name], MD Pharmacy:   [facility] [place], [place] - [address] [facility] [address] [place] [address] Phone: [phone] Fax: [phone]     Social Determinants of Health (SDOH) Social History: SDOH Screenings   Food Insecurity: No Food Insecurity (09/26/2022)  Housing: Low Risk  (09/16/2022)  Transportation Needs: No Transportation Needs (09/26/2022)  Utilities: Not At Risk (09/16/2022)  Alcohol Screen: Low Risk  (07/07/2022)  Depression (PHQ2-9): Medium Risk (07/14/2022)  Financial Resource Strain: Low Risk  (07/07/2022)  Physical Activity: Unknown (07/07/2022)  Recent Concern: Physical Activity - Inactive (07/07/2022)  Social Connections: Socially Integrated (07/07/2022)  Stress: No Stress Concern Present (07/07/2022)  Tobacco Use: Low Risk  (10/01/2022)   SDOH Interventions:     Readmission Risk Interventions    09/18/2022    2:59 PM 09/02/2022    5:18 PM 06/20/2022     3:58 PM  Readmission Risk Prevention Plan  Post Dischage Appt   Complete  Medication Screening   Complete  Transportation Screening Complete Complete Complete  PCP or Specialist Appt within 5-7 Days Complete Complete   Home Care Screening Complete Complete   Medication Review (RN CM) Complete Complete

## 2022-10-20 NOTE — Consult Note (Signed)
[facility]    Date of Admission:  10/18/2022     Reason for Consult: recurrent strep pna bacteremia    Referring Provider: Elgergawy    Abx: 7/29-c amox-clav  7/28-29 azith 7/27-28 vanc 7/27-29 ceftriaxone        Assessment: 72 yo female with ms, bronchiectasis, CVID, admitted with pna/recurrent strep pnae bacteremia   Reviewed chart and noted patient off ivig since 3//2024 until resuming again just after the prior 2 episodes of strep pnae  infection 08/2022.  Tee 4 weeks ago no evidence of endocarditis and she has no nidus for recurrence  She does have right prosthetic hip joint but no acute pain/swelling   7/27 bcx strep pnae (I pcn serum mic; S ceftriaxone serum mic; I ceftriaxone meningitis mic) 7/29 repeat bcx ngtd  Plan: No right/wrong way going about this, but would finish total 2 weeks of treatment for her and transitioned to bid amox-clav until 8/10 On 11/01/22 would continue amox-clav once a day for prophylaxis. Discussed levoflox option but would stick with amox-clav as less toxic potential Continue weekly ivig infusion with allergy Prevnar 20 to be given here If continued improvement tomorrow can discharge from id standpoint She has ID clinic follow up with me on 9/4 @ 145pm Discussed with primary team     ------------------------------------------------ Principal Problem:   Acute on chronic respiratory failure with hypoxia (HCC) Active Problems:   Hypothyroidism   Multiple sclerosis (HCC)   Hyponatremia   OSA on CPAP   Chronic obstructive pulmonary disease (HCC)   GERD (gastroesophageal reflux disease)   HCAP (healthcare-associated pneumonia)   Hypogammaglobulinemia (HCC)   Aspiration pneumonia (HCC)   Normocytic anemia   Thrombocytosis   Mild protein malnutrition (HCC)   Asymptomatic bacteriuria    HPI: Kayla [name] is a 72 y.o. female with ms, bronchiectasis, CVID, admitted with pna/recurrent strep  pnae bacteremia  Reviewed chart and patient has had 3 episodes strep pna bacteremia the last 2 months  She has been dx'ed with cvid and getting ivig, but off since 05/2022 for right hip issue  She has a prosthetic hip joint but no worsening pain/swelling on the right side  She does have imaging suggestion bronchiectasis as well   7/27 bcx strep pna.  Wbc 32 on admission improving with abx 7/29 repeat bcx ngtd  On ceftriaxone now  No headache, rash, back/joint pain   Has bilateral numbness which neurology will be following, in setting ms   Family History  Problem Relation Age of Onset   Other Mother        COVID   Colon cancer Father    Atrial fibrillation Brother    Heart attack Maternal Grandfather    Parkinson's disease Paternal Grandfather    Esophageal cancer Neg Hx    Stomach cancer Neg Hx    Rectal cancer Neg Hx     Social History   Tobacco Use   Smoking status: Never   Smokeless tobacco: Never  Vaping Use   Vaping status: Never Used  Substance Use Topics   Alcohol use: Not Currently    Alcohol/week: 2.0 standard drinks of alcohol    Types: 1 Glasses of wine, 1 Cans of beer per week    Comment: 2x/week    Drug use: No    Allergies  Allergen Reactions   Tramadol Other (See Comments)    Hallucinations    Contrast Media [Iodinated Contrast Media] Rash  Iohexol Hives   Morphine And Codeine Other (See Comments)    Hallucinations    Methylprednisolone Other (See Comments)    Decreases pts heart rate    Hydrocodone Itching   Oxycodone-Acetaminophen Rash and Other (See Comments)    Hallucinations, also   Phenytoin Sodium Extended Rash   Zanaflex [Tizanidine Hydrochloride] Rash    Review of Systems: ROS All Other ROS was negative, except mentioned above   Past Medical History:  Diagnosis Date   Arthritis    oa   Asthma    Bilateral vocal cord paralysis    Bronchiectasis (HCC)    Cervical cancer (HCC) 2005   Depression    GERD  (gastroesophageal reflux disease)    Hiatal hernia    Hypothyroidism    Immunodeficiency (HCC)    Gets IVIG infusions   Insomnia    Multiple sclerosis (HCC) 1996   Osteoporosis    Pneumonia 2024, 2014, 1992   Urine retention        Scheduled Meds:  (feeding supplement) PROSource Plus  30 mL Oral TID BM   albuterol  2.5 mg Nebulization QID   amoxicillin-clavulanate  1 tablet Oral Q12H   Followed by   Melene Muller ON 11/01/2022] amoxicillin-clavulanate  1 tablet Oral Q24H   budesonide (PULMICORT) nebulizer solution  0.5 mg Nebulization BID   calcium carbonate  1 tablet Oral Q breakfast   Chlorhexidine Gluconate Cloth  6 each Topical Daily   docusate sodium  200 mg Oral BID   enoxaparin (LOVENOX) injection  30 mg Subcutaneous Q24H   feeding supplement  1 Container Oral TID BM   FLUoxetine  40 mg Oral Daily   guaiFENesin  600 mg Oral BID   levothyroxine  100 mcg Oral QAC breakfast   mirabegron ER  50 mg Oral Daily   multivitamin with minerals  1 tablet Oral Daily   pantoprazole  40 mg Oral Daily   [START ON 10/21/2022] pneumococcal 20-valent conjugate vaccine  0.5 mL Intramuscular Tomorrow-1000   temazepam  15 mg Oral Q24H   Continuous Infusions: PRN Meds:.acetaminophen **OR** acetaminophen, levalbuterol, ondansetron **OR** ondansetron (ZOFRAN) IV, polyethylene glycol   OBJECTIVE: Blood pressure 101/60, pulse 82, temperature 97.8 F (36.6 C), temperature source Oral, resp. rate (!) 22, weight 42.7 kg, SpO2 96%.  Physical Exam  General/constitutional: no distress, pleasant; on room air; sitting in chair HEENT: Normocephalic, PER, Conj Clear, EOMI, Oropharynx clear Neck supple CV: rrr no mrg Lungs: normal respiratory effort; coarse bs Abd: Soft, Nontender Ext: no edema Skin: No Rash Neuro: nonfocal MSK: no peripheral joint swelling/tenderness/warmth; back spines nontender   Lab Results Lab Results  Component Value Date   WBC 14.9 (H) 10/20/2022   HGB 9.2 (L) 10/20/2022    HCT 29.2 (L) 10/20/2022   MCV 92.1 10/20/2022   PLT 640 (H) 10/20/2022    Lab Results  Component Value Date   CREATININE 0.54 10/20/2022   BUN 7 (L) 10/20/2022   NA 133 (L) 10/20/2022   K 3.5 10/20/2022   CL 101 10/20/2022   CO2 24 10/20/2022    Lab Results  Component Value Date   ALT 15 10/20/2022   AST 18 10/20/2022   ALKPHOS 62 10/20/2022   BILITOT 0.4 10/20/2022      Microbiology: Recent Results (from the past 240 hour(s))  Blood Culture (routine x 2)     Status: Abnormal (Preliminary result)   Collection Time: 10/18/22  3:59 AM   Specimen: BLOOD RIGHT ARM  Result Value Ref Range  Status   Specimen Description   Final    BLOOD RIGHT ARM BOTTLES DRAWN AEROBIC AND ANAEROBIC Performed at [facility], [address], [place], [place] [address]    Special Requests   Final    Blood Culture adequate volume Performed at [facility], [address] [place], [place] [address]    Culture  Setup Time   Final    GRAM POSITIVE COCCI IN BOTH AEROBIC AND ANAEROBIC BOTTLES CRITICAL VALUE NOTED.  VALUE IS CONSISTENT WITH PREVIOUSLY REPORTED AND CALLED VALUE.    Culture (A)  Final    STREPTOCOCCUS PNEUMONIAE SUSCEPTIBILITIES PERFORMED ON PREVIOUS CULTURE WITHIN THE LAST 5 DAYS. Performed at [facility], [address], [place], [place] [address]    Report Status PENDING  Incomplete  Blood Culture (routine x 2)     Status: Abnormal (Preliminary result)   Collection Time: 10/18/22  4:04 AM   Specimen: BLOOD RIGHT ARM  Result Value Ref Range Status   Specimen Description   Final    BLOOD RIGHT ARM BOTTLES DRAWN AEROBIC AND ANAEROBIC Performed at [facility], [address] [place], [place] [address]    Special Requests   Final    Blood Culture adequate volume Performed at [facility], [address] [place], [place] [address]    Culture  Setup Time   Final    GRAM POSITIVE COCCI IN  PAIRS IN BOTH AEROBIC AND ANAEROBIC BOTTLES CRITICAL RESULT CALLED TO, READ BACK BY AND VERIFIED WITH: PHARMD J. LEDFORD 10/18/22 @ 2301 BY AB Performed at [facility], [address], [place], [place] [address]    Culture STREPTOCOCCUS PNEUMONIAE (A)  Final   Report Status PENDING  Incomplete   Organism ID, Bacteria STREPTOCOCCUS PNEUMONIAE  Final      Susceptibility   Streptococcus pneumoniae - MIC*    ERYTHROMYCIN >=8 RESISTANT Resistant     LEVOFLOXACIN <=0.25 SENSITIVE Sensitive     VANCOMYCIN 0.5 SENSITIVE Sensitive     PENICILLIN (meningitis) 1 RESISTANT Resistant     PENO - penicillin 1      PENICILLIN (non-meningitis) 1 SENSITIVE Sensitive     PENICILLIN (oral) 1 INTERMEDIATE Intermediate     CEFTRIAXONE (non-meningitis) 1 SENSITIVE Sensitive     CEFTRIAXONE (meningitis) 1 INTERMEDIATE Intermediate     * STREPTOCOCCUS PNEUMONIAE  Blood Culture ID Panel (Reflexed)     Status: Abnormal   Collection Time: 10/18/22  4:04 AM  Result Value Ref Range Status   Enterococcus faecalis NOT DETECTED NOT DETECTED Final   Enterococcus Faecium NOT DETECTED NOT DETECTED Final   Listeria monocytogenes NOT DETECTED NOT DETECTED Final   Staphylococcus species NOT DETECTED NOT DETECTED Final   Staphylococcus aureus (BCID) NOT DETECTED NOT DETECTED Final   Staphylococcus epidermidis NOT DETECTED NOT DETECTED Final   Staphylococcus lugdunensis NOT DETECTED NOT DETECTED Final   Streptococcus species DETECTED (A) NOT DETECTED Final    Comment: CRITICAL RESULT CALLED TO, READ BACK BY AND VERIFIED WITH: PHARMD J. LEDFORD 10/18/22 @ 2301 BY AB    Streptococcus agalactiae NOT DETECTED NOT DETECTED Final   Streptococcus pneumoniae DETECTED (A) NOT DETECTED Final    Comment: CRITICAL RESULT CALLED TO, READ BACK BY AND VERIFIED WITH: PHARMD J. LEDFORD 10/18/22 @ 2301 BY AB    Streptococcus pyogenes NOT DETECTED NOT DETECTED Final   A.calcoaceticus-baumannii NOT DETECTED NOT DETECTED Final    Bacteroides fragilis NOT DETECTED NOT DETECTED Final  Enterobacterales NOT DETECTED NOT DETECTED Final   Enterobacter cloacae complex NOT DETECTED NOT DETECTED Final   Escherichia coli NOT DETECTED NOT DETECTED Final   Klebsiella aerogenes NOT DETECTED NOT DETECTED Final   Klebsiella oxytoca NOT DETECTED NOT DETECTED Final   Klebsiella pneumoniae NOT DETECTED NOT DETECTED Final   Proteus species NOT DETECTED NOT DETECTED Final   Salmonella species NOT DETECTED NOT DETECTED Final   Serratia marcescens NOT DETECTED NOT DETECTED Final   Haemophilus influenzae NOT DETECTED NOT DETECTED Final   Neisseria meningitidis NOT DETECTED NOT DETECTED Final   Pseudomonas aeruginosa NOT DETECTED NOT DETECTED Final   Stenotrophomonas maltophilia NOT DETECTED NOT DETECTED Final   Candida albicans NOT DETECTED NOT DETECTED Final   Candida auris NOT DETECTED NOT DETECTED Final   Candida glabrata NOT DETECTED NOT DETECTED Final   Candida krusei NOT DETECTED NOT DETECTED Final   Candida parapsilosis NOT DETECTED NOT DETECTED Final   Candida tropicalis NOT DETECTED NOT DETECTED Final   Cryptococcus neoformans/gattii NOT DETECTED NOT DETECTED Final    Comment: Performed at [facility], [address] [place], [place] [address]  Culture, blood (Routine X 2) w Reflex to ID Panel     Status: None (Preliminary result)   Collection Time: 10/20/22  5:37 AM   Specimen: BLOOD  Result Value Ref Range Status   Specimen Description BLOOD BLOOD RIGHT HAND  Final   Special Requests   Final    BOTTLES DRAWN AEROBIC AND ANAEROBIC Blood Culture adequate volume   Culture   Final    NO GROWTH < 12 HOURS Performed at [facility], [address], [place], [place] [address]    Report Status PENDING  Incomplete  Culture, blood (Routine X 2) w Reflex to ID Panel     Status: None (Preliminary result)   Collection Time: 10/20/22  5:37 AM   Specimen: BLOOD  Result Value Ref Range Status   Specimen Description  BLOOD BLOOD RIGHT WRIST  Final   Special Requests   Final    BOTTLES DRAWN AEROBIC AND ANAEROBIC Blood Culture adequate volume   Culture   Final    NO GROWTH < 12 HOURS Performed at [facility], [address] [place], [place] [address]    Report Status PENDING  Incomplete  Expectorated Sputum Assessment w Gram Stain, Rflx to Resp Cult     Status: None   Collection Time: 10/20/22 12:05 PM   Specimen: Expectorated Sputum  Result Value Ref Range Status   Specimen Description EXPECTORATED SPUTUM  Final   Special Requests Immunocompromised  Final   Sputum evaluation   Final    THIS SPECIMEN IS ACCEPTABLE FOR SPUTUM CULTURE Performed at [facility], [address], [place], [place] [address]    Report Status 10/20/2022 FINAL  Final  Culture, Respiratory w Gram Stain     Status: None (Preliminary result)   Collection Time: 10/20/22 12:05 PM  Result Value Ref Range Status   Specimen Description EXPECTORATED SPUTUM  Final   Special Requests Immunocompromised Reflexed from [id number]  Final   Gram Stain   Final    ABUNDANT WBC PRESENT, PREDOMINANTLY PMN NO ORGANISMS SEEN Performed at [facility], [address], [place], [place] [address]    Culture PENDING  Incomplete   Report Status PENDING  Incomplete     Serology:    Imaging: If present, new imagings (plain films, ct scans, and mri) have been personally visualized  and interpreted; radiology reports have been reviewed. Decision making incorporated into the Impression / Recommendations.  10/20/22 cxr 1. New area of mild to moderate severity right upper lobe infiltrate. 2. Predominantly stable mild to moderate severity bibasilar atelectasis and/or infiltrate. 3. Small, stable bilateral pleural effusions.  Raymondo Band, MD [facility] [phone] pager    10/20/2022, 4:29 PM

## 2022-10-20 NOTE — Evaluation (Signed)
Occupational Therapy Evaluation Patient Details Name: Kayla [name] MRN: [id number] DOB: April 12, 1950 Today's Date: 10/20/2022   History of Present Illness 72 y.o. female brought in 10/18/22 because of worsening shortness of breath which has been progressive over past three weeks. Aspiration pna; PMH: OA, cervical cancer, COPD, asthma, MS, PNA, OSA, hypothyroidism, osteoporosis, R THA, R hip closed reduction manipulation; vocal cord paralysis   Clinical Impression   Patient reports living with her husband in a 1 level home and is normally Independent with ADLs and uses RW inside home and transport chair in community since patient report of her feet going numb at the beginning of June. Patient's husband completes IADLs/drives. Patient requires min guard for functional mobility and increased fatigue and max A for LB ADLs/peri hygiene.  Patient would benefit from additional OT intervention to address functional deficits of ADLs , activity tolerance and overall strength.     Recommendations for follow up therapy are one component of a multi-disciplinary discharge planning process, led by the attending physician.  Recommendations may be updated based on patient status, additional functional criteria and insurance authorization.   Assistance Recommended at Discharge Frequent or constant Supervision/Assistance  Patient can return home with the following A little help with walking and/or transfers;Assistance with cooking/housework;Assist for transportation;A little help with bathing/dressing/bathroom    Functional Status Assessment  Patient has had a recent decline in their functional status and demonstrates the ability to make significant improvements in function in a reasonable and predictable amount of time.  Equipment Recommendations  None recommended by OT    Recommendations for Other Services       Precautions / Restrictions Precautions Precautions: Fall Restrictions Weight Bearing  Restrictions: No      Mobility Bed Mobility Overal bed mobility: Needs Assistance Bed Mobility: Sit to Supine (assist for lifting LEs into bed)       Sit to supine: Supervision        Transfers Overall transfer level: Needs assistance Equipment used: Rolling walker (2 wheels) Transfers: Sit to/from Stand Sit to Stand: Min guard                  Balance Overall balance assessment: Modified Independent                                         ADL either performed or assessed with clinical judgement   ADL Overall ADL's : Needs assistance/impaired Eating/Feeding: Independent   Grooming: Wash/dry face;Sitting;Set up   Upper Body Bathing: Minimal assistance   Lower Body Bathing: Maximal assistance   Upper Body Dressing : Minimal assistance   Lower Body Dressing: Maximal assistance   Toilet Transfer: Min guard   Toileting- Clothing Manipulation and Hygiene: Minimal assistance       Functional mobility during ADLs: Min guard;Rolling walker (2 wheels)       Vision Baseline Vision/History: 1 Wears glasses Patient Visual Report: No change from baseline       Perception     Praxis      Pertinent Vitals/Pain       Hand Dominance Right   Extremity/Trunk Assessment Upper Extremity Assessment Upper Extremity Assessment: Generalized weakness       Cervical / Trunk Assessment Cervical / Trunk Assessment: Normal   Communication Communication Communication: Expressive difficulties   Cognition Arousal/Alertness: Awake/alert Behavior During Therapy: WFL for tasks assessed/performed Overall Cognitive Status: Within Functional Limits for tasks  assessed                                       General Comments       Exercises     Shoulder Instructions      Home Living Family/patient expects to be discharged to:: Private residence Living Arrangements: Spouse/significant other Available Help at Discharge:  Family;Available 24 hours/day (patient reports that her husband completes IADLs) Type of Home: House Home Access: Stairs to enter [employer] of Steps: 4 Entrance Stairs-Rails: Right;Left Home Layout: One level     Bathroom Shower/Tub: Producer, television/film/video: Handicapped height     Home Equipment: Agricultural consultant (2 wheels);Shower seat;Grab bars - tub/shower;Rollator (4 wheels);Transport chair (patient reports that she uses transport chair in community and RW in home)          Prior Functioning/Environment Prior Level of Function : Independent/Modified Independent                        OT Problem List: Decreased strength;Decreased activity tolerance;Decreased safety awareness      OT Treatment/Interventions: Therapeutic exercise;Self-care/ADL training;Therapeutic activities;Patient/family education    OT Goals(Current goals can be found in the care plan section) Acute Rehab OT Goals OT Goal Formulation: With patient Time For Goal Achievement: 11/03/22 Potential to Achieve Goals: Good  OT Frequency: Min 2X/week    Co-evaluation              AM-PAC OT "6 Clicks" Daily Activity     Outcome Measure Help from another person eating meals?: None Help from another person taking care of personal grooming?: None Help from another person toileting, which includes using toliet, bedpan, or urinal?: A Lot Help from another person bathing (including washing, rinsing, drying)?: A Lot Help from another person to put on and taking off regular upper body clothing?: A Little Help from another person to put on and taking off regular lower body clothing?: A Lot 6 Click Score: 17   End of Session Equipment Utilized During Treatment: Gait belt;Rolling walker (2 wheels) Nurse Communication: Mobility status  Activity Tolerance: Patient tolerated treatment well;Patient limited by fatigue Patient left: in bed;with call bell/phone within reach  OT Visit  Diagnosis: Unsteadiness on feet (R26.81);Muscle weakness (generalized) (M62.81);History of falling (Z91.81)                Time: 8295-6213 OT Time Calculation (min): 19 min Charges:  OT General Charges $OT Visit: 1 Visit  Governor Specking OT/L  Denice Paradise 10/20/2022, 3:10 PM

## 2022-10-20 NOTE — Progress Notes (Signed)
   10/20/22 2004  BiPAP/CPAP/SIPAP  BiPAP/CPAP/SIPAP Pt Type Adult  Reason BIPAP/CPAP not in use Non-compliant (Refused, no machine in room. Pt in no distress on 6L.)  BiPAP/CPAP /SiPAP Vitals  SpO2 97 %  Bilateral Breath Sounds Diminished;Rhonchi

## 2022-10-21 DIAGNOSIS — J189 Pneumonia, unspecified organism: Secondary | ICD-10-CM

## 2022-10-21 DIAGNOSIS — G35 Multiple sclerosis: Secondary | ICD-10-CM | POA: Diagnosis not present

## 2022-10-21 DIAGNOSIS — J9621 Acute and chronic respiratory failure with hypoxia: Secondary | ICD-10-CM | POA: Diagnosis not present

## 2022-10-21 LAB — CULTURE, BLOOD (ROUTINE X 2)
Special Requests: ADEQUATE
Special Requests: ADEQUATE

## 2022-10-21 LAB — URINE CULTURE: Culture: >=100000 — AB

## 2022-10-21 MED ORDER — SODIUM PHOSPHATES 45 MMOLE/15ML IV SOLN
30.0000 mmol | Freq: Once | INTRAVENOUS | Status: AC
  Administered 2022-10-21: 30 mmol via INTRAVENOUS
  Filled 2022-10-21: qty 10

## 2022-10-21 MED ORDER — ALBUTEROL SULFATE (2.5 MG/3ML) 0.083% IN NEBU
2.5000 mg | INHALATION_SOLUTION | Freq: Two times a day (BID) | RESPIRATORY_TRACT | Status: DC
  Administered 2022-10-21 – 2022-10-24 (×6): 2.5 mg via RESPIRATORY_TRACT
  Filled 2022-10-21 (×6): qty 3

## 2022-10-21 NOTE — Plan of Care (Signed)

## 2022-10-21 NOTE — Care Management Important Message (Signed)
Important Message  Patient Details  Name: NATISHIA GELBER MRN: [id number] Date of Birth: Aug 19, 1950   Medicare Important Message Given:  Yes     Tory Mckissack 10/21/2022, 3:11 PM

## 2022-10-21 NOTE — Progress Notes (Addendum)
PROGRESS NOTE    [name]  WUJ:[id number] DOB: [birth year] DOA: 10/18/2022 PCP: [name], MD    Chief Complaint  Patient presents with   Respiratory Distress    Brief Narrative:   Kayla [name] is a 72 y.o. female with medical history significant of Osteoarthritis, asthma, vocal cord paralysis, bronchiectasis, cervical cancer, depression, GERD, hiatal hernia, hypothyroidism, multiple sclerosis, immunodeficiency on IVIG infusions, osteoporosis, urinary retention, recently admitted and discharged due to acute respiratory failure with hypoxia in the setting of community-acquired pneumonia and Streptococcus bacteremia, she was discharged home, she was started on oxygen by her PCP given you noted hypoxia as an outpatient, she presents with worsening hypoxia, weakness, cough, his workup significant for hypoxia, and pneumonia, as well.  Her blood culture is growing strep pneumonia as well.    Assessment & Plan:   Principal Problem:   Acute on chronic respiratory failure with hypoxia (HCC) Active Problems:   Hypothyroidism   Multiple sclerosis (HCC)   Hyponatremia   GERD (gastroesophageal reflux disease)   OSA on CPAP   Chronic obstructive pulmonary disease (HCC)   HCAP (healthcare-associated pneumonia)   Hypogammaglobulinemia (HCC)   Aspiration pneumonia (HCC)   Normocytic anemia   Thrombocytosis   Mild protein malnutrition (HCC)   Asymptomatic bacteriuria  Acute on chronic respiratory failure with hypoxia (HCC) HCAP (healthcare associated pneumonia) Streptococcus bacteremia -Patient with recurrent admissions due to multifocal pneumonia, initially on broad-spectrum antibiotic coverage to cover for HCAP, blood culture growing Streptococcus, so he will be treated for Streptococcus pneumonia, antibiotics has been narrowed to IV Rocephin -Following surveillance blood cultures mains negative on 7/29 -There is a concern for aspiration pneumonia, given a chronic cough and  vocal cord paralysis, she has been followed closely by SLP with recent MBS study as an outpatient, sheis  advanced to regular diet with thin liquid as discussed with SLP. -She remains on 6 L oxygen, she was encouraged again today to use incentive spirometer and flutter valve. -Leukocytosis trending down -Continue to trend procalcitonin -Antibiotics management per ID, she has been transitioned to oral Augmentin, to finish treatment course for total of 2 weeks, then transition to once daily for prophylaxis. -ID clinic follow-up appointment scheduled on 9/4 with Dr. Renold Don -Patient to continue her weekly IVIG received by her immunologist Dr. Rebeca Allegra -Prevnar 20 to be given here   Multiple sclerosis (HCC) Having numbness and worsening weakness on her feet.  MRI brain, cervical and thoracic spine obtained as an outpatient by her primary neurologist, with no active lesion or cord lesion. -As discussed with husband, plan for patient to see her primary neurologist as an outpatient for nerve conduction study.   Hypothyroidism Continue  levothyroxine.   Hyponatremia Volume depletion, continue with gentle IV hydration   GERD (gastroesophageal reflux disease Continue with protonix Switch to oral formulation once cleared for p.o.   OSA on CPAP BiPAP at bedtime.   Hypogammaglobulinemia (HCC) On  IVIG therapy with Dr. Sharyn Lull as outpatient      Normocytic anemia Monitor hematocrit and hemoglobin.     Thrombocytosis In the setting of acute infection and anemia.     protein malnutrition (HCC) Protein supplementation.     Asymptomatic bacteriuria Has history of frequent UTIs. On nightly trimethoprim home. Will hold while the patient is getting IV antibiotics   DVT prophylaxis: Lovenox Code Status: Full Family Communication: Cussed with husband at bedside 7/29, discussed with husband in hallway 7/30 Disposition:   Status is: Inpatient    Consultants:  none   Subjective:  Reports  generalized weakness, fatigue, congestion and productive cough   Objective: Vitals:   10/21/22 0000 10/21/22 0732 10/21/22 0800 10/21/22 1130  BP: 106/61     Pulse: 83 79  76  Resp: 19   19  Temp: 97.8 F (36.6 C)  97.6 F (36.4 C)   TempSrc: Oral  Oral   SpO2: 97% 98% 96% 98%  Weight:       No intake or output data in the 24 hours ending 10/21/22 1238  Filed Weights   10/19/22 0900  Weight: 42.7 kg    Examination:  Awake Alert, Oriented X 3, frail, deconditioned Symmetrical Chest wall movement, improved air entry at the bases RRR,No Gallops,Rubs or new Murmurs, No Parasternal Heave +ve B.Sounds, Abd Soft, No tenderness, No rebound - guarding or rigidity. No Cyanosis, Clubbing or edema, No new Rash or bruise        Data Reviewed: I have personally reviewed following labs and imaging studies  CBC: Recent Labs  Lab 10/18/22 0359 10/19/22 0144 10/20/22 0537 10/21/22 0128  WBC 32.2* 23.4* 14.9* 13.2*  NEUTROABS 28.4* 19.8* 11.8* 9.8*  HGB 11.3* 9.4* 9.2* 9.1*  HCT 35.4* 29.6* 29.2* 29.1*  MCV 90.1 89.7 92.1 93.0  PLT 688* 567* 640* 566*    Basic Metabolic Panel: Recent Labs  Lab 10/18/22 0359 10/19/22 0144 10/20/22 0537 10/21/22 0128  NA 130* 136 133* 131*  K 3.5 3.5 3.5 4.2  CL 91* 101 101 100  CO2 24 21* 24 26  GLUCOSE 120* 68* 92 102*  BUN 19 16 7* 7*  CREATININE 0.74 0.77 0.54 0.53  CALCIUM 9.5 8.9 8.8* 8.8*  MG  --   --   --  1.8  PHOS  --   --   --  2.1*    GFR: Estimated Creatinine Clearance: 44.1 mL/min (by C-G formula based on SCr of 0.53 mg/dL).  Liver Function Tests: Recent Labs  Lab 10/18/22 0359 10/19/22 0144 10/20/22 0537 10/21/22 0128  AST 26 20 18 20   ALT 23 17 15 16   ALKPHOS 96 70 62 55  BILITOT 0.5 0.5 0.4 0.6  PROT 6.9 5.0* 4.8* 4.7*  ALBUMIN 3.0* 1.9* 1.7* 1.9*    CBG: No results for input(s): "GLUCAP" in the last 168 hours.   Recent Results (from the past 240 hour(s))  Blood Culture (routine x 2)     Status:  Abnormal   Collection Time: 10/18/22  3:59 AM   Specimen: BLOOD RIGHT ARM  Result Value Ref Range Status   Specimen Description   Final    BLOOD RIGHT ARM BOTTLES DRAWN AEROBIC AND ANAEROBIC Performed at [facility], [address], [place], [place] [address]    Special Requests   Final    Blood Culture adequate volume Performed at [facility], [address] [place], [place] [address]    Culture  Setup Time   Final    GRAM POSITIVE COCCI IN BOTH AEROBIC AND ANAEROBIC BOTTLES CRITICAL VALUE NOTED.  VALUE IS CONSISTENT WITH PREVIOUSLY REPORTED AND CALLED VALUE.    Culture (A)  Final    STREPTOCOCCUS PNEUMONIAE SUSCEPTIBILITIES PERFORMED ON PREVIOUS CULTURE WITHIN THE LAST 5 DAYS. Performed at [facility], [address] [place], [place] [address]    Report Status 10/21/2022 FINAL  Final  Blood Culture (routine x 2)     Status: Abnormal   Collection Time: 10/18/22  4:04 AM   Specimen: BLOOD RIGHT  ARM  Result Value Ref Range Status   Specimen Description   Final    BLOOD RIGHT ARM BOTTLES DRAWN AEROBIC AND ANAEROBIC Performed at [facility], [address] [place], [place] [address]    Special Requests   Final    Blood Culture adequate volume Performed at [facility], [address], [place], [place] [address]    Culture  Setup Time   Final    GRAM POSITIVE COCCI IN PAIRS IN BOTH AEROBIC AND ANAEROBIC BOTTLES CRITICAL RESULT CALLED TO, READ BACK BY AND VERIFIED WITH: PHARMD J. LEDFORD 10/18/22 @ 2301 BY AB Performed at [facility], [address] [place], [place] [address]    Culture STREPTOCOCCUS PNEUMONIAE (A)  Final   Report Status 10/21/2022 FINAL  Final   Organism ID, Bacteria STREPTOCOCCUS PNEUMONIAE  Final      Susceptibility   Streptococcus pneumoniae - MIC*    ERYTHROMYCIN >=8 RESISTANT Resistant     LEVOFLOXACIN <=0.25 SENSITIVE Sensitive     VANCOMYCIN 0.5  SENSITIVE Sensitive     PENICILLIN (meningitis) 1 RESISTANT Resistant     PENO - penicillin 1      PENICILLIN (non-meningitis) 1 SENSITIVE Sensitive     PENICILLIN (oral) 1 INTERMEDIATE Intermediate     CEFTRIAXONE (non-meningitis) 1 SENSITIVE Sensitive     CEFTRIAXONE (meningitis) 1 INTERMEDIATE Intermediate     * STREPTOCOCCUS PNEUMONIAE  Blood Culture ID Panel (Reflexed)     Status: Abnormal   Collection Time: 10/18/22  4:04 AM  Result Value Ref Range Status   Enterococcus faecalis NOT DETECTED NOT DETECTED Final   Enterococcus Faecium NOT DETECTED NOT DETECTED Final   Listeria monocytogenes NOT DETECTED NOT DETECTED Final   Staphylococcus species NOT DETECTED NOT DETECTED Final   Staphylococcus aureus (BCID) NOT DETECTED NOT DETECTED Final   Staphylococcus epidermidis NOT DETECTED NOT DETECTED Final   Staphylococcus lugdunensis NOT DETECTED NOT DETECTED Final   Streptococcus species DETECTED (A) NOT DETECTED Final    Comment: CRITICAL RESULT CALLED TO, READ BACK BY AND VERIFIED WITH: PHARMD J. LEDFORD 10/18/22 @ 2301 BY AB    Streptococcus agalactiae NOT DETECTED NOT DETECTED Final   Streptococcus pneumoniae DETECTED (A) NOT DETECTED Final    Comment: CRITICAL RESULT CALLED TO, READ BACK BY AND VERIFIED WITH: PHARMD J. LEDFORD 10/18/22 @ 2301 BY AB    Streptococcus pyogenes NOT DETECTED NOT DETECTED Final   A.calcoaceticus-baumannii NOT DETECTED NOT DETECTED Final   Bacteroides fragilis NOT DETECTED NOT DETECTED Final   Enterobacterales NOT DETECTED NOT DETECTED Final   Enterobacter cloacae complex NOT DETECTED NOT DETECTED Final   Escherichia coli NOT DETECTED NOT DETECTED Final   Klebsiella aerogenes NOT DETECTED NOT DETECTED Final   Klebsiella oxytoca NOT DETECTED NOT DETECTED Final   Klebsiella pneumoniae NOT DETECTED NOT DETECTED Final   Proteus species NOT DETECTED NOT DETECTED Final   Salmonella species NOT DETECTED NOT DETECTED Final   Serratia marcescens NOT DETECTED  NOT DETECTED Final   Haemophilus influenzae NOT DETECTED NOT DETECTED Final   Neisseria meningitidis NOT DETECTED NOT DETECTED Final   Pseudomonas aeruginosa NOT DETECTED NOT DETECTED Final   Stenotrophomonas maltophilia NOT DETECTED NOT DETECTED Final   Candida albicans NOT DETECTED NOT DETECTED Final   Candida auris NOT DETECTED NOT DETECTED Final   Candida glabrata NOT DETECTED NOT DETECTED Final   Candida krusei NOT DETECTED NOT DETECTED Final   Candida parapsilosis NOT DETECTED NOT DETECTED Final   Candida tropicalis  NOT DETECTED NOT DETECTED Final   Cryptococcus neoformans/gattii NOT DETECTED NOT DETECTED Final    Comment: Performed at [facility], [address], [place], [place] [address]  Urine Culture     Status: Abnormal   Collection Time: 10/18/22  7:25 AM   Specimen: Urine, Random  Result Value Ref Range Status   Specimen Description   Final    URINE, RANDOM Performed at [facility] [address], [place], [place] [address]    Special Requests   Final    NONE Reflexed from [id number] Performed at [facility], [address] [place], [place] [address]    Culture >=100,000 COLONIES/mL PSEUDOMONAS AERUGINOSA (A)  Final   Report Status 10/21/2022 FINAL  Final   Organism ID, Bacteria PSEUDOMONAS AERUGINOSA (A)  Final      Susceptibility   Pseudomonas aeruginosa - MIC*    CEFTAZIDIME 4 SENSITIVE Sensitive     CIPROFLOXACIN <=0.25 SENSITIVE Sensitive     GENTAMICIN <=1 SENSITIVE Sensitive     IMIPENEM 2 SENSITIVE Sensitive     PIP/TAZO 8 SENSITIVE Sensitive     CEFEPIME 4 SENSITIVE Sensitive     * >=100,000 COLONIES/mL PSEUDOMONAS AERUGINOSA  Culture, blood (Routine X 2) w Reflex to ID Panel     Status: None (Preliminary result)   Collection Time: 10/20/22  5:37 AM   Specimen: BLOOD  Result Value Ref Range Status   Specimen Description BLOOD BLOOD RIGHT HAND  Final   Special Requests   Final    BOTTLES DRAWN AEROBIC AND  ANAEROBIC Blood Culture adequate volume   Culture   Final    NO GROWTH 1 DAY Performed at [facility], [address], [place], [place] [address]    Report Status PENDING  Incomplete  Culture, blood (Routine X 2) w Reflex to ID Panel     Status: None (Preliminary result)   Collection Time: 10/20/22  5:37 AM   Specimen: BLOOD  Result Value Ref Range Status   Specimen Description BLOOD BLOOD RIGHT WRIST  Final   Special Requests   Final    BOTTLES DRAWN AEROBIC AND ANAEROBIC Blood Culture adequate volume   Culture   Final    NO GROWTH 1 DAY Performed at [facility], [address], [place], [place] [address]    Report Status PENDING  Incomplete  Expectorated Sputum Assessment w Gram Stain, Rflx to Resp Cult     Status: None   Collection Time: 10/20/22 12:05 PM   Specimen: Expectorated Sputum  Result Value Ref Range Status   Specimen Description EXPECTORATED SPUTUM  Final   Special Requests Immunocompromised  Final   Sputum evaluation   Final    THIS SPECIMEN IS ACCEPTABLE FOR SPUTUM CULTURE Performed at [facility], [address], [place], [place] [address]    Report Status 10/20/2022 FINAL  Final  Culture, Respiratory w Gram Stain     Status: None (Preliminary result)   Collection Time: 10/20/22 12:05 PM  Result Value Ref Range Status   Specimen Description EXPECTORATED SPUTUM  Final   Special Requests Immunocompromised Reflexed from [id number]  Final   Gram Stain   Final    ABUNDANT WBC PRESENT, PREDOMINANTLY PMN NO ORGANISMS SEEN    Culture   Final    CULTURE REINCUBATED FOR BETTER GROWTH Performed at [facility], [address] [place], [place] [address]    Report Status PENDING  Incomplete  Radiology Studies: No results found.      Scheduled Meds:  (feeding supplement) PROSource Plus  30 mL Oral TID BM   albuterol  2.5 mg Nebulization QID   amoxicillin-clavulanate  1 tablet Oral Q12H   Followed by   Melene Muller ON 11/01/2022]  amoxicillin-clavulanate  1 tablet Oral Q24H   budesonide (PULMICORT) nebulizer solution  0.5 mg Nebulization BID   calcium carbonate  1 tablet Oral Q breakfast   Chlorhexidine Gluconate Cloth  6 each Topical Daily   docusate sodium  200 mg Oral BID   enoxaparin (LOVENOX) injection  30 mg Subcutaneous Q24H   feeding supplement  1 Container Oral TID BM   FLUoxetine  40 mg Oral Daily   guaiFENesin  600 mg Oral BID   levothyroxine  100 mcg Oral QAC breakfast   mirabegron ER  50 mg Oral Daily   multivitamin with minerals  1 tablet Oral Daily   pantoprazole  40 mg Oral Daily   pneumococcal 20-valent conjugate vaccine  0.5 mL Intramuscular Tomorrow-1000   temazepam  15 mg Oral Q24H   Continuous Infusions:  sodium phosphate 30 mmol in dextrose 5 % 250 mL infusion 30 mmol (10/21/22 1058)     LOS: 3 days       Huey Bienenstock, MD [facility]   To contact the attending provider between 7A-7P or the covering provider during after hours 7P-7A, please log into the web site [email or web address] and access using universal [place] password for that web site. If you do not have the password, please call the hospital operator.  10/21/2022, 12:38 PM

## 2022-10-21 NOTE — Progress Notes (Signed)
Physical Therapy Treatment Patient Details Name: Kayla [name] MRN: [id number] DOB: 12-11-1950 Today's Date: 10/21/2022   History of Present Illness 72 y.o. female brought in 10/18/22 because of worsening shortness of breath which has been progressive over past three weeks. Aspiration pna; PMH: OA, cervical cancer, COPD, asthma, MS, PNA, OSA, hypothyroidism, osteoporosis, R THA, R hip closed reduction manipulation; vocal cord paralysis    PT Comments  Pt greeted resting in bed and agreeable to session with continued progress towards acute goals. Pt continues to be limited by fatigue, weakness, decreased cardiopulmonary endurance, impaired balance/postural reactions and decreased coordination. Pt able to perform bed mobility with up to min A to manage trunk and return Les to bed at end of session. Pt with steady rise to standing, demonstrating good hand placement with min guard for safety. Pt able to progress gait with RW for support and min A to maintain balance. Pt with noted internal rotation and decreased DF/foot drop in RLE. Pt spouse present at bedside and supportive. Pt continues to benefit from skilled PT services to progress toward functional mobility goals.     If plan is discharge home, recommend the following: A little help with walking and/or transfers;Assistance with cooking/housework;Help with stairs or ramp for entrance   Can travel by private vehicle        Equipment Recommendations  None recommended by PT    Recommendations for Other Services       Precautions / Restrictions Precautions Precautions: Fall Restrictions Weight Bearing Restrictions: No     Mobility  Bed Mobility Overal bed mobility: Needs Assistance Bed Mobility: Sit to Supine, Supine to Sit     Supine to sit: Min assist Sit to supine: Min assist   General bed mobility comments: min A to eleavte trunk and bring LEs into bed    Transfers Overall transfer level: Needs assistance Equipment used:  Rolling walker (2 wheels) Transfers: Sit to/from Stand Sit to Stand: Min guard           General transfer comment: pt uses one hand on RW and one to push off bed    Ambulation/Gait Ambulation/Gait assistance: Min assist Gait Distance (Feet): 40 Feet Assistive device: Rolling walker (2 wheels) Gait Pattern/deviations: Decreased stride length, Decreased dorsiflexion - right, Steppage, Step-through pattern Gait velocity: decr     General Gait Details: slow gait with RW support, noted internal rotation of RLE and foot and decreased DF on R(baseline) min A to steady, pt needing cues for upright posture   Stairs             Wheelchair Mobility     Tilt Bed    Modified Rankin (Stroke Patients Only)       Balance Overall balance assessment: Modified Independent                                          Cognition Arousal/Alertness: Awake/alert Behavior During Therapy: WFL for tasks assessed/performed Overall Cognitive Status: Within Functional Limits for tasks assessed                                 General Comments: soft spoken        Exercises      General Comments General comments (skin integrity, edema, etc.): 7L O2 at rest on arrival, increased to 8L during gait  to maintain SpO2 >90%, replaced 7L at end of session      Pertinent Vitals/Pain Pain Assessment Pain Assessment: Faces Faces Pain Scale: Hurts a little bit Pain Location: seems to be having pain when coughing Pain Descriptors / Indicators: Grimacing, Discomfort Pain Intervention(s): Monitored during session, Repositioned    Home Living                          Prior Function            PT Goals (current goals can now be found in the care plan section) Acute Rehab PT Goals Patient Stated Goal: return home PT Goal Formulation: With patient Time For Goal Achievement: 11/03/22 Progress towards PT goals: Progressing toward goals     Frequency    Min 1X/week      PT Plan Current plan remains appropriate    Co-evaluation              AM-PAC PT "6 Clicks" Mobility   Outcome Measure  Help needed turning from your back to your side while in a flat bed without using bedrails?: None Help needed moving from lying on your back to sitting on the side of a flat bed without using bedrails?: A Little Help needed moving to and from a bed to a chair (including a wheelchair)?: A Little Help needed standing up from a chair using your arms (e.g., wheelchair or bedside chair)?: A Little Help needed to walk in hospital room?: A Little Help needed climbing 3-5 steps with a railing? : A Lot 6 Click Score: 18    End of Session Equipment Utilized During Treatment: Oxygen;Gait belt Activity Tolerance: Patient tolerated treatment well;Patient limited by fatigue Patient left: with call bell/phone within reach;with chair alarm set;in bed;with family/visitor present Nurse Communication: Mobility status PT Visit Diagnosis: Other abnormalities of gait and mobility (R26.89)     Time: 1240-1311 PT Time Calculation (min) (ACUTE ONLY): 31 min  Charges:    $Gait Training: 8-22 mins $Therapeutic Activity: 8-22 mins PT General Charges $$ ACUTE PT VISIT: 1 Visit                     Tobi Bastos R. PTA Acute Rehabilitation Services Office: [phone]   Catalina Antigua 10/21/2022, 1:44 PM

## 2022-10-22 ENCOUNTER — Ambulatory Visit: Payer: Medicare Other | Admitting: Internal Medicine

## 2022-10-22 DIAGNOSIS — D649 Anemia, unspecified: Secondary | ICD-10-CM | POA: Diagnosis not present

## 2022-10-22 DIAGNOSIS — J189 Pneumonia, unspecified organism: Secondary | ICD-10-CM | POA: Diagnosis not present

## 2022-10-22 DIAGNOSIS — J9621 Acute and chronic respiratory failure with hypoxia: Secondary | ICD-10-CM | POA: Diagnosis not present

## 2022-10-22 DIAGNOSIS — E871 Hypo-osmolality and hyponatremia: Secondary | ICD-10-CM | POA: Diagnosis not present

## 2022-10-22 NOTE — Progress Notes (Signed)
Patient has Chronic Respiratory secondary to COPD pt needs Non-Invasive ventilator to achieve proper Tidal Volume with an Auto exhalation mode to properly ventilate, thus reducing exacerbations and hospital admissions. "CPAP or Bi-level Pap is not sufficient in treating patient's condition

## 2022-10-22 NOTE — Plan of Care (Signed)

## 2022-10-22 NOTE — TOC Progression Note (Addendum)
Transition of Care [facility]) - Progression Note    Patient Details  Name: Kayla [name] MRN: [id number] Date of Birth: June 16, 1950  Transition of Care [facility] CM/SW Contact  Gordy Clement, RN Phone Number: 10/22/2022, 9:53 AM  Clinical Narrative:     Update 3:07 PM  Moving forward with NIV. Just waiting on auth from insurance company.    CM received call from [facility], Morrie Sheldon stating that patient would qualify for an NIV.  Paperwork e mailed and Provider contacted via secure chat with information to see if he wanted to move forward.  CM will follow and process accordingly     Expected Discharge Plan: Home w Home Health Services Barriers to Discharge: Continued Medical Work up  Expected Discharge Plan and Services   Discharge Planning Services: CM Consult Post Acute Care Choice: NA (TBD) Living arrangements for the past 2 months: Single Family Home                 DME Arranged: N/A DME Agency: [employer] (Home O2)       HH Arranged: RN, PT HH Agency: [facility] Date HH Agency Contacted: 10/20/22 Time HH Agency Contacted: [phone] Representative spoke with at [facility] Agency: Hassel Neth - liaison   Social Determinants of Health (SDOH) Interventions SDOH Screenings   Food Insecurity: No Food Insecurity (09/26/2022)  Housing: Low Risk  (09/16/2022)  Transportation Needs: No Transportation Needs (09/26/2022)  Utilities: Not At Risk (09/16/2022)  Alcohol Screen: Low Risk  (07/07/2022)  Depression (PHQ2-9): Medium Risk (07/14/2022)  Financial Resource Strain: Low Risk  (07/07/2022)  Physical Activity: Unknown (07/07/2022)  Recent Concern: Physical Activity - Inactive (07/07/2022)  Social Connections: Socially Integrated (07/07/2022)  Stress: No Stress Concern Present (07/07/2022)  Tobacco Use: Low Risk  (10/01/2022)    Readmission Risk Interventions    09/18/2022    2:59 PM 09/02/2022    5:18 PM 06/20/2022    3:58 PM  Readmission Risk Prevention Plan  Post Dischage Appt    Complete  Medication Screening   Complete  Transportation Screening Complete Complete Complete  PCP or Specialist Appt within 5-7 Days Complete Complete   Home Care Screening Complete Complete   Medication Review (RN CM) Complete Complete

## 2022-10-22 NOTE — Consult Note (Signed)
   [facility] CM Inpatient Consult   10/22/2022  Kayla [name] Jul 27, 1950 [id number]  [facility] [[facility]]  Accountable Care Organization [ACO] Patient: Medicare ACO REACH  Primary Care Provider:  [name], MD with [place] [facility] which is listed to provide the transition of care follow up  Patient screened for less than 30 days readmission hospitalization with noted extreme high risk score for unplanned readmission risk with a 4 day length of stay and to assess for potential [facility]  [[facility]] Care Management service needs for post hospital transition for care coordination.  Review of patient's electronic medical record reveals patient is admitted for CAP and recommended for home with home health.  Reviewed inpatient [facility] team notes for potential post hospital care coordination needs.  Patient to have home health and DME.  Patient resting on rounds with husband at bedside.  2:45 pm Met with them at the bedside.  Endorses PCP and follow up needs. Spoke about oxygen needs and journey with it and MS for past 28 years. They also endorses Palliative Care following.  Plan:  Continue to follow progress and disposition to assess for post hospital community care coordination/management needs.  Referral request for community care coordination:  Needs for follow up is to be assigned for care coordination.  Of note, [facility] Care Management/Population Health does not replace or interfere with any arrangements made by the Inpatient Transition of Care team.  For questions contact:   Charlesetta Shanks, RN BSN CCM [facility]  [phone] business mobile phone Toll free office [phone]  *Concierge Line  [phone] Fax number: [phone] [email or web address]

## 2022-10-22 NOTE — Progress Notes (Signed)
PT Cancellation Note  Patient Details Name: MIRRAH STRIANO MRN: [id number] DOB: 1950-06-07   Cancelled Treatment:    Reason Eval/Treat Not Completed: (P) Fatigue/lethargy limiting ability to participate (Pt c/o fatigue, of note resting BP 93/45, pt agreeable to get OOB tomorrow and discussed with RN/MD possibly getting her TED hose to see if this improves BP stability.)   Dorathy Kinsman Leauna Sharber 10/22/2022, 6:58 PM

## 2022-10-22 NOTE — Progress Notes (Addendum)
RT educated on patient on frequency and proper usage of flutter valve. Pt demonstrated understanding of proper usage. Flutter at bedside.

## 2022-10-22 NOTE — Progress Notes (Signed)
PROGRESS NOTE        PATIENT DETAILS Name: Kayla [name] Age: 72 y.o. Sex: female Date of Birth: 03-24-1951 Admit Date: 10/18/2022 Admitting Physician No admitting provider for patient encounter. ZOX:[id number], Bernadene Bell, MD  Brief Summary: Patient is a 72 y.o.  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  Significant events: 7/27>> admit to [facility]  Significant studies: 7/27>> CXR: Right upper lobe infiltrate.  Significant microbiology data: 7/27>> blood culture: Streptococcus pneumoniae  Procedures: None  Consults: Infectious disease  Subjective: Lying comfortably in bed-on around 6 L of HFNC.  Claims she is stable at rest but gets short of breath with minimal movement.  Objective: Vitals: Blood pressure (!) 111/55, pulse 81, temperature 97.9 F (36.6 C), temperature source Oral, resp. rate 20, weight 42.7 kg, SpO2 96%.   Exam: Gen Exam:Alert awake-not in any distress HEENT:atraumatic, normocephalic Chest: B/L clear to auscultation anteriorly CVS:S1S2 regular Abdomen:soft non tender, non distended Extremities:no edema Neurology: Chronic lower extremity weakness-unchanged. Skin: no rash  Pertinent Labs/Radiology:    Latest Ref Rng & Units 10/22/2022    1:23 AM 10/21/2022    1:28 AM 10/20/2022    5:37 AM  CBC  WBC 4.0 - 10.5 K/uL 8.5  13.2  14.9   Hemoglobin 12.0 - 15.0 g/dL 9.3  9.1  9.2   Hematocrit 36.0 - 46.0 % 28.8  29.1  29.2   Platelets 150 - 400 K/uL 614  566  640     Lab Results  Component Value Date   NA 133 (L) 10/22/2022    K 3.8 10/22/2022   CL 97 (L) 10/22/2022   CO2 26 10/22/2022      Assessment/Plan: Acute on chronic hypoxic respiratory failure due to recurrent streptococcal pneumonia involving right upper lobe Recurrent streptococcal pneumonia bacteremia Stable on 6 L of HFNC at rest-does get short of breath with minimal movement Appreciate infectious disease input-recommendations are to continue Augmentin twice daily until 8/10-and then to transition to once daily dosing for prophylaxis. Discussed with nursing staff-attempt to slowly titrate down FiO2-changed to regular oxygen from HFNC.  History of multiple sclerosis Recent MRI brain/C-spine/T-spine done as an outpatient-no active lesion PT/OT Outpatient follow-up with neurology  Hypogammaglobinemia On IVIG therapy as an outpatient  Hyponatremia Mild Supportive care  Hypothyroidism Synthroid  GERD PPI  Normocytic anemia Likely due to chronic disease Follow CBC periodically  Thrombocytosis Likely reactive Follow CBC periodically  Asymptomatic bacteriuria No symptoms of UTI-already on antibiotics  OSA on CPAP  Nutrition Status: Nutrition Problem: Severe Malnutrition Etiology: chronic illness (multiple sclerosis, immunodeficiency, recurrent CAP) Signs/Symptoms: severe fat depletion, severe muscle depletion, percent weight loss Percent weight loss: 15.4 % Interventions: Boost Breeze, MVI, Prostat  Underweight: Estimated body mass index is 16.68 kg/m as calculated from the following:   Height as of 09/30/22: 5\' 3"  (1.6 m).   Weight as of this encounter: 42.7 kg.   Code status:   Code Status: Full Code   DVT Prophylaxis: enoxaparin (LOVENOX) injection 30 mg Start: 10/19/22 1015    Family Communication: None at  bedside   Disposition Plan: Status is: Inpatient Remains inpatient appropriate because: Severity of illness   Planned Discharge Destination:Home health   Diet: Diet Order             Diet regular Room  service appropriate? Yes; Fluid consistency: Thin  Diet effective now                     Antimicrobial agents: Anti-infectives (From admission, onward)    Start     Dose/Rate Route Frequency Ordered Stop   11/01/22 1000  amoxicillin-clavulanate (AUGMENTIN) 875-125 MG per tablet 1 tablet       Placed in "Followed by" Linked Group   1 tablet Oral Every 24 hours 10/20/22 1441     10/20/22 2200  amoxicillin-clavulanate (AUGMENTIN) 875-125 MG per tablet 1 tablet       Placed in "Followed by" Linked Group   1 tablet Oral Every 12 hours 10/20/22 1441 11/01/22 0959   10/19/22 2000  vancomycin (VANCOCIN) IVPB 1000 mg/200 mL premix  Status:  Discontinued        1,000 mg 200 mL/hr over 60 Minutes Intravenous Every 36 hours 10/18/22 0828 10/19/22 0007   10/19/22 0830  azithromycin (ZITHROMAX) 500 mg in sodium chloride 0.9 % 250 mL IVPB  Status:  Discontinued        500 mg 250 mL/hr over 60 Minutes Intravenous Daily 10/19/22 0742 10/20/22 0855   10/19/22 0030  cefTRIAXone (ROCEPHIN) 2 g in sodium chloride 0.9 % 100 mL IVPB  Status:  Discontinued        2 g 200 mL/hr over 30 Minutes Intravenous Every 24 hours 10/19/22 0007 10/20/22 1441   10/18/22 2000  ceFEPIme (MAXIPIME) 2 g in sodium chloride 0.9 % 100 mL IVPB  Status:  Discontinued        2 g 200 mL/hr over 30 Minutes Intravenous Every 12 hours 10/18/22 0828 10/19/22 0007   10/18/22 1000  metroNIDAZOLE (FLAGYL) IVPB 500 mg  Status:  Discontinued        500 mg 100 mL/hr over 60 Minutes Intravenous Every 12 hours 10/18/22 0807 10/19/22 0007   10/18/22 0815  vancomycin (VANCOCIN) IVPB 1000 mg/200 mL premix        1,000 mg 200 mL/hr over 60 Minutes Intravenous  Once 10/18/22 0807 10/18/22 0951   10/18/22 0815  ceFEPIme (MAXIPIME) 2 g in sodium chloride 0.9 % 100 mL IVPB        2 g 200 mL/hr over 30 Minutes Intravenous  Once 10/18/22 0807 10/18/22 0925   10/18/22 0630  cefTRIAXone (ROCEPHIN) 2 g in sodium chloride 0.9 % 100 mL IVPB         2 g 200 mL/hr over 30 Minutes Intravenous  Once 10/18/22 0623 10/18/22 0802   10/18/22 0630  azithromycin (ZITHROMAX) 500 mg in sodium chloride 0.9 % 250 mL IVPB        500 mg 250 mL/hr over 60 Minutes Intravenous  Once 10/18/22 0623 10/18/22 0839        MEDICATIONS: Scheduled Meds:  (feeding supplement) PROSource Plus  30 mL Oral TID BM   albuterol  2.5 mg Nebulization BID   amoxicillin-clavulanate  1 tablet Oral Q12H   Followed by   Melene Muller ON 11/01/2022] amoxicillin-clavulanate  1 tablet Oral Q24H   budesonide (PULMICORT) nebulizer solution  0.5 mg Nebulization BID   calcium carbonate  1 tablet Oral Q breakfast   Chlorhexidine Gluconate Cloth  6 each Topical Daily   docusate  sodium  200 mg Oral BID   enoxaparin (LOVENOX) injection  30 mg Subcutaneous Q24H   feeding supplement  1 Container Oral TID BM   FLUoxetine  40 mg Oral Daily   guaiFENesin  600 mg Oral BID   levothyroxine  100 mcg Oral QAC breakfast   mirabegron ER  50 mg Oral Daily   multivitamin with minerals  1 tablet Oral Daily   pantoprazole  40 mg Oral Daily   pneumococcal 20-valent conjugate vaccine  0.5 mL Intramuscular Tomorrow-1000   temazepam  15 mg Oral Q24H   Continuous Infusions: PRN Meds:.acetaminophen **OR** acetaminophen, levalbuterol, ondansetron **OR** ondansetron (ZOFRAN) IV, polyethylene glycol   I have personally reviewed following labs and imaging studies  LABORATORY DATA: CBC: Recent Labs  Lab 10/18/22 0359 10/19/22 0144 10/20/22 0537 10/21/22 0128 10/22/22 0123  WBC 32.2* 23.4* 14.9* 13.2* 8.5  NEUTROABS 28.4* 19.8* 11.8* 9.8*  --   HGB 11.3* 9.4* 9.2* 9.1* 9.3*  HCT 35.4* 29.6* 29.2* 29.1* 28.8*  MCV 90.1 89.7 92.1 93.0 88.3  PLT 688* 567* 640* 566* 614*    Basic Metabolic Panel: Recent Labs  Lab 10/18/22 0359 10/19/22 0144 10/20/22 0537 10/21/22 0128 10/22/22 0123  NA 130* 136 133* 131* 133*  K 3.5 3.5 3.5 4.2 3.8  CL 91* 101 101 100 97*  CO2 24 21* 24 26 26   GLUCOSE  120* 68* 92 102* 89  BUN 19 16 7* 7* 6*  CREATININE 0.74 0.77 0.54 0.53 0.47  CALCIUM 9.5 8.9 8.8* 8.8* 8.8*  MG  --   --   --  1.8 1.9  PHOS  --   --   --  2.1* 3.7    GFR: Estimated Creatinine Clearance: 44.1 mL/min (by C-G formula based on SCr of 0.47 mg/dL).  Liver Function Tests: Recent Labs  Lab 10/18/22 0359 10/19/22 0144 10/20/22 0537 10/21/22 0128  AST 26 20 18 20   ALT 23 17 15 16   ALKPHOS 96 70 62 55  BILITOT 0.5 0.5 0.4 0.6  PROT 6.9 5.0* 4.8* 4.7*  ALBUMIN 3.0* 1.9* 1.7* 1.9*   No results for input(s): "LIPASE", "AMYLASE" in the last 168 hours. No results for input(s): "AMMONIA" in the last 168 hours.  Coagulation Profile: Recent Labs  Lab 10/18/22 0359  INR 1.2    Cardiac Enzymes: No results for input(s): "CKTOTAL", "CKMB", "CKMBINDEX", "TROPONINI" in the last 168 hours.  BNP (last 3 results) Recent Labs    01/14/22 0824  PROBNP 112.0*    Lipid Profile: No results for input(s): "CHOL", "HDL", "LDLCALC", "TRIG", "CHOLHDL", "LDLDIRECT" in the last 72 hours.  Thyroid Function Tests: No results for input(s): "TSH", "T4TOTAL", "FREET4", "T3FREE", "THYROIDAB" in the last 72 hours.  Anemia Panel: No results for input(s): "VITAMINB12", "FOLATE", "FERRITIN", "TIBC", "IRON", "RETICCTPCT" in the last 72 hours.  Urine analysis:    Component Value Date/Time   COLORURINE YELLOW 10/18/2022 0725   APPEARANCEUR HAZY (A) 10/18/2022 0725   LABSPEC 1.017 10/18/2022 0725   PHURINE 5.0 10/18/2022 0725   GLUCOSEU NEGATIVE 10/18/2022 0725   GLUCOSEU NEGATIVE 05/26/2022 1034   HGBUR NEGATIVE 10/18/2022 0725   BILIRUBINUR NEGATIVE 10/18/2022 0725   BILIRUBINUR negative 07/10/2021 0919   KETONESUR 5 (A) 10/18/2022 0725   PROTEINUR NEGATIVE 10/18/2022 0725   UROBILINOGEN 0.2 05/26/2022 1034   NITRITE NEGATIVE 10/18/2022 0725   LEUKOCYTESUR LARGE (A) 10/18/2022 0725    Sepsis Labs: Lactic Acid, Venous    Component Value Date/Time   LATICACIDVEN 1.1  10/18/2022 1954  MICROBIOLOGY: Recent Results (from the past 240 hour(s))  Blood Culture (routine x 2)     Status: Abnormal   Collection Time: 10/18/22  3:59 AM   Specimen: BLOOD RIGHT ARM  Result Value Ref Range Status   Specimen Description   Final    BLOOD RIGHT ARM BOTTLES DRAWN AEROBIC AND ANAEROBIC Performed at [facility] [address], [place], [place] [address]    Special Requests   Final    Blood Culture adequate volume Performed at [facility], [address] [place], [place] [address]    Culture  Setup Time   Final    GRAM POSITIVE COCCI IN BOTH AEROBIC AND ANAEROBIC BOTTLES CRITICAL VALUE NOTED.  VALUE IS CONSISTENT WITH PREVIOUSLY REPORTED AND CALLED VALUE.    Culture (A)  Final    STREPTOCOCCUS PNEUMONIAE SUSCEPTIBILITIES PERFORMED ON PREVIOUS CULTURE WITHIN THE LAST 5 DAYS. Performed at [facility], [address] [place], [place] [address]    Report Status 10/21/2022 FINAL  Final  Blood Culture (routine x 2)     Status: Abnormal   Collection Time: 10/18/22  4:04 AM   Specimen: BLOOD RIGHT ARM  Result Value Ref Range Status   Specimen Description   Final    BLOOD RIGHT ARM BOTTLES DRAWN AEROBIC AND ANAEROBIC Performed at [facility], [address], [place], [place] [address]    Special Requests   Final    Blood Culture adequate volume Performed at [facility], [address] [place], [place] [address]    Culture  Setup Time   Final    GRAM POSITIVE COCCI IN PAIRS IN BOTH AEROBIC AND ANAEROBIC BOTTLES CRITICAL RESULT CALLED TO, READ BACK BY AND VERIFIED WITH: PHARMD J. LEDFORD 10/18/22 @ 2301 BY AB Performed at [facility], [address] [place], [place] [address]    Culture STREPTOCOCCUS PNEUMONIAE (A)  Final   Report Status 10/21/2022 FINAL  Final   Organism ID, Bacteria STREPTOCOCCUS PNEUMONIAE  Final      Susceptibility   Streptococcus pneumoniae  - MIC*    ERYTHROMYCIN >=8 RESISTANT Resistant     LEVOFLOXACIN <=0.25 SENSITIVE Sensitive     VANCOMYCIN 0.5 SENSITIVE Sensitive     PENICILLIN (meningitis) 1 RESISTANT Resistant     PENO - penicillin 1      PENICILLIN (non-meningitis) 1 SENSITIVE Sensitive     PENICILLIN (oral) 1 INTERMEDIATE Intermediate     CEFTRIAXONE (non-meningitis) 1 SENSITIVE Sensitive     CEFTRIAXONE (meningitis) 1 INTERMEDIATE Intermediate     * STREPTOCOCCUS PNEUMONIAE  Blood Culture ID Panel (Reflexed)     Status: Abnormal   Collection Time: 10/18/22  4:04 AM  Result Value Ref Range Status   Enterococcus faecalis NOT DETECTED NOT DETECTED Final   Enterococcus Faecium NOT DETECTED NOT DETECTED Final   Listeria monocytogenes NOT DETECTED NOT DETECTED Final   Staphylococcus species NOT DETECTED NOT DETECTED Final   Staphylococcus aureus (BCID) NOT DETECTED NOT DETECTED Final   Staphylococcus epidermidis NOT DETECTED NOT DETECTED Final   Staphylococcus lugdunensis NOT DETECTED NOT DETECTED Final   Streptococcus species DETECTED (A) NOT DETECTED Final    Comment: CRITICAL RESULT CALLED TO, READ BACK BY AND VERIFIED WITH: PHARMD J. LEDFORD 10/18/22 @ 2301 BY AB    Streptococcus agalactiae NOT DETECTED NOT DETECTED Final   Streptococcus pneumoniae DETECTED (A) NOT DETECTED Final    Comment: CRITICAL RESULT CALLED TO, READ BACK BY AND VERIFIED  WITH: PHARMD J. LEDFORD 10/18/22 @ 2301 BY AB    Streptococcus pyogenes NOT DETECTED NOT DETECTED Final   A.calcoaceticus-baumannii NOT DETECTED NOT DETECTED Final   Bacteroides fragilis NOT DETECTED NOT DETECTED Final   Enterobacterales NOT DETECTED NOT DETECTED Final   Enterobacter cloacae complex NOT DETECTED NOT DETECTED Final   Escherichia coli NOT DETECTED NOT DETECTED Final   Klebsiella aerogenes NOT DETECTED NOT DETECTED Final   Klebsiella oxytoca NOT DETECTED NOT DETECTED Final   Klebsiella pneumoniae NOT DETECTED NOT DETECTED Final   Proteus species NOT  DETECTED NOT DETECTED Final   Salmonella species NOT DETECTED NOT DETECTED Final   Serratia marcescens NOT DETECTED NOT DETECTED Final   Haemophilus influenzae NOT DETECTED NOT DETECTED Final   Neisseria meningitidis NOT DETECTED NOT DETECTED Final   Pseudomonas aeruginosa NOT DETECTED NOT DETECTED Final   Stenotrophomonas maltophilia NOT DETECTED NOT DETECTED Final   Candida albicans NOT DETECTED NOT DETECTED Final   Candida auris NOT DETECTED NOT DETECTED Final   Candida glabrata NOT DETECTED NOT DETECTED Final   Candida krusei NOT DETECTED NOT DETECTED Final   Candida parapsilosis NOT DETECTED NOT DETECTED Final   Candida tropicalis NOT DETECTED NOT DETECTED Final   Cryptococcus neoformans/gattii NOT DETECTED NOT DETECTED Final    Comment: Performed at [facility], [address], [place], [place] [address]  Urine Culture     Status: Abnormal   Collection Time: 10/18/22  7:25 AM   Specimen: Urine, Random  Result Value Ref Range Status   Specimen Description   Final    URINE, RANDOM Performed at [facility], [address], [place], [place] [address]    Special Requests   Final    NONE Reflexed from [id number] Performed at [facility], [address], [place], [place] [address]    Culture >=100,000 COLONIES/mL PSEUDOMONAS AERUGINOSA (A)  Final   Report Status 10/21/2022 FINAL  Final   Organism ID, Bacteria PSEUDOMONAS AERUGINOSA (A)  Final      Susceptibility   Pseudomonas aeruginosa - MIC*    CEFTAZIDIME 4 SENSITIVE Sensitive     CIPROFLOXACIN <=0.25 SENSITIVE Sensitive     GENTAMICIN <=1 SENSITIVE Sensitive     IMIPENEM 2 SENSITIVE Sensitive     PIP/TAZO 8 SENSITIVE Sensitive     CEFEPIME 4 SENSITIVE Sensitive     * >=100,000 COLONIES/mL PSEUDOMONAS AERUGINOSA  Culture, blood (Routine X 2) w Reflex to ID Panel     Status: None (Preliminary result)   Collection Time: 10/20/22  5:37 AM   Specimen: BLOOD  Result Value Ref Range  Status   Specimen Description BLOOD BLOOD RIGHT HAND  Final   Special Requests   Final    BOTTLES DRAWN AEROBIC AND ANAEROBIC Blood Culture adequate volume   Culture   Final    NO GROWTH 2 DAYS Performed at [facility], [address] [place], [place] [address]    Report Status PENDING  Incomplete  Culture, blood (Routine X 2) w Reflex to ID Panel     Status: None (Preliminary result)   Collection Time: 10/20/22  5:37 AM   Specimen: BLOOD  Result Value Ref Range Status   Specimen Description BLOOD BLOOD RIGHT WRIST  Final   Special Requests   Final    BOTTLES DRAWN AEROBIC AND ANAEROBIC Blood Culture adequate volume   Culture   Final    NO GROWTH 2 DAYS Performed at [facility], [address], [place], [place] [address]    Report Status PENDING  Incomplete  Expectorated Sputum Assessment w Gram Stain, Rflx to Resp Cult     Status: None   Collection Time: 10/20/22 12:05 PM   Specimen: Expectorated Sputum  Result Value Ref Range Status   Specimen Description EXPECTORATED SPUTUM  Final   Special Requests Immunocompromised  Final   Sputum evaluation   Final    THIS SPECIMEN IS ACCEPTABLE FOR SPUTUM CULTURE Performed at [facility], [address], [place], [place] [address]    Report Status 10/20/2022 FINAL  Final  Culture, Respiratory w Gram Stain     Status: None   Collection Time: 10/20/22 12:05 PM  Result Value Ref Range Status   Specimen Description EXPECTORATED SPUTUM  Final   Special Requests Immunocompromised Reflexed from [id number]  Final   Gram Stain   Final    ABUNDANT WBC PRESENT, PREDOMINANTLY PMN NO ORGANISMS SEEN    Culture   Final    FEW Normal respiratory flora-no Staph aureus or Pseudomonas seen Performed at [facility], [address] [place], [place] [address]    Report Status 10/22/2022 FINAL  Final    RADIOLOGY STUDIES/RESULTS: No results found.   LOS: 4 days   Jeoffrey Massed, MD  [facility]    To contact  the attending provider between 7A-7P or the covering provider during after hours 7P-7A, please log into the web site [email or web address] and access using universal [place] password for that web site. If you do not have the password, please call the hospital operator.  10/22/2022, 1:27 PM

## 2022-10-23 DIAGNOSIS — D649 Anemia, unspecified: Secondary | ICD-10-CM | POA: Diagnosis not present

## 2022-10-23 DIAGNOSIS — E871 Hypo-osmolality and hyponatremia: Secondary | ICD-10-CM | POA: Diagnosis not present

## 2022-10-23 DIAGNOSIS — J189 Pneumonia, unspecified organism: Secondary | ICD-10-CM | POA: Diagnosis not present

## 2022-10-23 DIAGNOSIS — J9621 Acute and chronic respiratory failure with hypoxia: Secondary | ICD-10-CM | POA: Diagnosis not present

## 2022-10-23 LAB — GLUCOSE, CAPILLARY
Glucose-Capillary: 94 mg/dL (ref 70–99)
Glucose-Capillary: 89 mg/dL (ref 70–99)
Glucose-Capillary: 137 mg/dL — ABNORMAL HIGH (ref 70–99)

## 2022-10-23 NOTE — Progress Notes (Signed)
Pt refused BIPAP.

## 2022-10-23 NOTE — Progress Notes (Addendum)
PROGRESS NOTE        PATIENT DETAILS Name: Kayla [name] Age: 72 y.o. Sex: female Date of Birth: May 03, 1950 Admit Date: 10/18/2022 Admitting Physician No admitting provider for patient encounter. ONG:EXBMW, Bernadene Bell, MD  Brief Summary: Patient is a 72 y.o.  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  Significant events: 7/27>> admit to [facility]  Significant studies: 7/27>> CXR: Right upper lobe infiltrate.  Significant microbiology data: 7/27>> blood culture: Streptococcus pneumoniae  Procedures: None  Consults: Infectious disease  Subjective: No major issues overnight-lying comfortably in bed-had some difficulty ambulating with physical therapy yesterday due to weakness.  Titrated down to around 3-4 L of HFNC.  Spoke with RT-to see if we can get her further down to her usual home regimen.  Objective: Vitals: Blood pressure 101/64, pulse 77, temperature 98.4 F (36.9 C), temperature source Oral, resp. rate 16, weight 49.5 kg, SpO2 96%.   Exam: Gen Exam:Alert awake-not in any distress HEENT:atraumatic, normocephalic Chest: B/L clear to auscultation anteriorly CVS:S1S2 regular Abdomen:soft non tender, non distended Extremities:no edema Neurology: Non focal-chronic lower extremity weakness/foot drop unchanged. Skin: no rash  Pertinent Labs/Radiology:    Latest Ref Rng & Units 10/23/2022    5:47 AM 10/22/2022    1:23 AM 10/21/2022    1:28 AM  CBC  WBC 4.0 - 10.5 K/uL 9.4  8.5  13.2   Hemoglobin 12.0 - 15.0 g/dL 9.1  9.3  9.1   Hematocrit  36.0 - 46.0 % 29.1  28.8  29.1   Platelets 150 - 400 K/uL 684  614  566     Lab Results  Component Value Date   NA 132 (L) 10/23/2022   K 4.0 10/23/2022   CL 95 (L) 10/23/2022   CO2 27 10/23/2022      Assessment/Plan: Acute on chronic hypoxic respiratory failure due to recurrent streptococcal pneumonia involving right upper lobe Recurrent streptococcal pneumonia bacteremia Gradually improving-down to 3-4 L of HFNC today-she is usually on 2-3 L of oxygen Mobilize with physical therapy today and see how she does Appreciate infectious disease input-recommendations are to continue Augmentin twice daily until 8/10-and then to transition to once daily dosing for prophylaxis.  History of multiple sclerosis Neurogenic bladder Recent MRI brain/C-spine/T-spine done as an outpatient-no active lesion Does intermittent in/out cath as an outpatient-currently with indwelling Foley which he prefers when she is hospitalized. PT/OT Outpatient follow-up with neurology  Hypogammaglobinemia On IVIG therapy as an outpatient  Hyponatremia Mild Supportive care  Hypothyroidism Synthroid  GERD PPI  Normocytic anemia Likely due to chronic disease Follow CBC periodically  Thrombocytosis Likely reactive Follow CBC periodically  Asymptomatic bacteriuria No symptoms of UTI-already on antibiotics  OSA on CPAP  Nutrition Status: Nutrition Problem: Severe Malnutrition Etiology: chronic illness (multiple sclerosis, immunodeficiency, recurrent CAP) Signs/Symptoms: severe fat depletion, severe muscle depletion, percent weight loss Percent weight loss: 15.4 % Interventions: Boost Breeze, MVI, Prostat  Underweight: Estimated body mass index is 19.33 kg/m as calculated from the following:   Height as of 09/30/22: 5\' 3"  (1.6 m).  Weight as of this encounter: 49.5 kg.   Code status:   Code Status: Full Code   DVT Prophylaxis: Place TED hose Start: 10/23/22 0624 enoxaparin (LOVENOX)  injection 30 mg Start: 10/19/22 1015    Family Communication:Spouse-Frank-[phone] updated over the phone on 8/1.   Disposition Plan: Status is: Inpatient Remains inpatient appropriate because: Severity of illness   Planned Discharge Destination:Home health   Diet: Diet Order             Diet regular Room service appropriate? Yes; Fluid consistency: Thin  Diet effective now                     Antimicrobial agents: Anti-infectives (From admission, onward)    Start     Dose/Rate Route Frequency Ordered Stop   11/01/22 1000  amoxicillin-clavulanate (AUGMENTIN) 875-125 MG per tablet 1 tablet       Placed in "Followed by" Linked Group   1 tablet Oral Every 24 hours 10/20/22 1441     10/20/22 2200  amoxicillin-clavulanate (AUGMENTIN) 875-125 MG per tablet 1 tablet       Placed in "Followed by" Linked Group   1 tablet Oral Every 12 hours 10/20/22 1441 11/01/22 0959   10/19/22 2000  vancomycin (VANCOCIN) IVPB 1000 mg/200 mL premix  Status:  Discontinued        1,000 mg 200 mL/hr over 60 Minutes Intravenous Every 36 hours 10/18/22 0828 10/19/22 0007   10/19/22 0830  azithromycin (ZITHROMAX) 500 mg in sodium chloride 0.9 % 250 mL IVPB  Status:  Discontinued        500 mg 250 mL/hr over 60 Minutes Intravenous Daily 10/19/22 0742 10/20/22 0855   10/19/22 0030  cefTRIAXone (ROCEPHIN) 2 g in sodium chloride 0.9 % 100 mL IVPB  Status:  Discontinued        2 g 200 mL/hr over 30 Minutes Intravenous Every 24 hours 10/19/22 0007 10/20/22 1441   10/18/22 2000  ceFEPIme (MAXIPIME) 2 g in sodium chloride 0.9 % 100 mL IVPB  Status:  Discontinued        2 g 200 mL/hr over 30 Minutes Intravenous Every 12 hours 10/18/22 0828 10/19/22 0007   10/18/22 1000  metroNIDAZOLE (FLAGYL) IVPB 500 mg  Status:  Discontinued        500 mg 100 mL/hr over 60 Minutes Intravenous Every 12 hours 10/18/22 0807 10/19/22 0007   10/18/22 0815  vancomycin (VANCOCIN) IVPB 1000 mg/200 mL premix        1,000  mg 200 mL/hr over 60 Minutes Intravenous  Once 10/18/22 0807 10/18/22 0951   10/18/22 0815  ceFEPIme (MAXIPIME) 2 g in sodium chloride 0.9 % 100 mL IVPB        2 g 200 mL/hr over 30 Minutes Intravenous  Once 10/18/22 0807 10/18/22 0925   10/18/22 0630  cefTRIAXone (ROCEPHIN) 2 g in sodium chloride 0.9 % 100 mL IVPB        2 g 200 mL/hr over 30 Minutes Intravenous  Once 10/18/22 0623 10/18/22 0802   10/18/22 0630  azithromycin (ZITHROMAX) 500 mg in sodium chloride 0.9 % 250 mL IVPB        500 mg 250 mL/hr over 60 Minutes Intravenous  Once 10/18/22 0623 10/18/22 0839        MEDICATIONS: Scheduled Meds:  (feeding supplement) PROSource Plus  30 mL Oral TID BM   albuterol  2.5 mg Nebulization BID   amoxicillin-clavulanate  1 tablet Oral Q12H   Followed by   [  START ON 11/01/2022] amoxicillin-clavulanate  1 tablet Oral Q24H   budesonide (PULMICORT) nebulizer solution  0.5 mg Nebulization BID   calcium carbonate  1 tablet Oral Q breakfast   Chlorhexidine Gluconate Cloth  6 each Topical Daily   docusate sodium  200 mg Oral BID   enoxaparin (LOVENOX) injection  30 mg Subcutaneous Q24H   feeding supplement  1 Container Oral TID BM   FLUoxetine  40 mg Oral Daily   guaiFENesin  600 mg Oral BID   levothyroxine  100 mcg Oral QAC breakfast   mirabegron ER  50 mg Oral Daily   multivitamin with minerals  1 tablet Oral Daily   pantoprazole  40 mg Oral Daily   temazepam  15 mg Oral Q24H   Continuous Infusions: PRN Meds:.acetaminophen **OR** acetaminophen, levalbuterol, ondansetron **OR** ondansetron (ZOFRAN) IV, polyethylene glycol   I have personally reviewed following labs and imaging studies  LABORATORY DATA: CBC: Recent Labs  Lab 10/18/22 0359 10/19/22 0144 10/20/22 0537 10/21/22 0128 10/22/22 0123 10/23/22 0547  WBC 32.2* 23.4* 14.9* 13.2* 8.5 9.4  NEUTROABS 28.4* 19.8* 11.8* 9.8*  --   --   HGB 11.3* 9.4* 9.2* 9.1* 9.3* 9.1*  HCT 35.4* 29.6* 29.2* 29.1* 28.8* 29.1*  MCV 90.1  89.7 92.1 93.0 88.3 90.1  PLT 688* 567* 640* 566* 614* 684*    Basic Metabolic Panel: Recent Labs  Lab 10/19/22 0144 10/20/22 0537 10/21/22 0128 10/22/22 0123 10/23/22 0547  NA 136 133* 131* 133* 132*  K 3.5 3.5 4.2 3.8 4.0  CL 101 101 100 97* 95*  CO2 21* 24 26 26 27   GLUCOSE 68* 92 102* 89 86  BUN 16 7* 7* 6* 7*  CREATININE 0.77 0.54 0.53 0.47 0.48  CALCIUM 8.9 8.8* 8.8* 8.8* 9.0  MG  --   --  1.8 1.9 1.9  PHOS  --   --  2.1* 3.7 2.9    GFR: Estimated Creatinine Clearance: 51.1 mL/min (by C-G formula based on SCr of 0.48 mg/dL).  Liver Function Tests: Recent Labs  Lab 10/18/22 0359 10/19/22 0144 10/20/22 0537 10/21/22 0128  AST 26 20 18 20   ALT 23 17 15 16   ALKPHOS 96 70 62 55  BILITOT 0.5 0.5 0.4 0.6  PROT 6.9 5.0* 4.8* 4.7*  ALBUMIN 3.0* 1.9* 1.7* 1.9*   No results for input(s): "LIPASE", "AMYLASE" in the last 168 hours. No results for input(s): "AMMONIA" in the last 168 hours.  Coagulation Profile: Recent Labs  Lab 10/18/22 0359  INR 1.2    Cardiac Enzymes: No results for input(s): "CKTOTAL", "CKMB", "CKMBINDEX", "TROPONINI" in the last 168 hours.  BNP (last 3 results) Recent Labs    01/14/22 0824  PROBNP 112.0*    Lipid Profile: No results for input(s): "CHOL", "HDL", "LDLCALC", "TRIG", "CHOLHDL", "LDLDIRECT" in the last 72 hours.  Thyroid Function Tests: No results for input(s): "TSH", "T4TOTAL", "FREET4", "T3FREE", "THYROIDAB" in the last 72 hours.  Anemia Panel: No results for input(s): "VITAMINB12", "FOLATE", "FERRITIN", "TIBC", "IRON", "RETICCTPCT" in the last 72 hours.  Urine analysis:    Component Value Date/Time   COLORURINE YELLOW 10/18/2022 0725   APPEARANCEUR HAZY (A) 10/18/2022 0725   LABSPEC 1.017 10/18/2022 0725   PHURINE 5.0 10/18/2022 0725   GLUCOSEU NEGATIVE 10/18/2022 0725   GLUCOSEU NEGATIVE 05/26/2022 1034   HGBUR NEGATIVE 10/18/2022 0725   BILIRUBINUR NEGATIVE 10/18/2022 0725   BILIRUBINUR negative 07/10/2021  0919   KETONESUR 5 (A) 10/18/2022 0725   PROTEINUR NEGATIVE 10/18/2022 0725  UROBILINOGEN 0.2 05/26/2022 1034   NITRITE NEGATIVE 10/18/2022 0725   LEUKOCYTESUR LARGE (A) 10/18/2022 0725    Sepsis Labs: Lactic Acid, Venous    Component Value Date/Time   LATICACIDVEN 1.1 10/18/2022 1954    MICROBIOLOGY: Recent Results (from the past 240 hour(s))  Blood Culture (routine x 2)     Status: Abnormal   Collection Time: 10/18/22  3:59 AM   Specimen: BLOOD RIGHT ARM  Result Value Ref Range Status   Specimen Description   Final    BLOOD RIGHT ARM BOTTLES DRAWN AEROBIC AND ANAEROBIC Performed at [facility], [address], [place], [place] [address]    Special Requests   Final    Blood Culture adequate volume Performed at [facility], [address] [place], [place] [address]    Culture  Setup Time   Final    GRAM POSITIVE COCCI IN BOTH AEROBIC AND ANAEROBIC BOTTLES CRITICAL VALUE NOTED.  VALUE IS CONSISTENT WITH PREVIOUSLY REPORTED AND CALLED VALUE.    Culture (A)  Final    STREPTOCOCCUS PNEUMONIAE SUSCEPTIBILITIES PERFORMED ON PREVIOUS CULTURE WITHIN THE LAST 5 DAYS. Performed at [facility], [address] [place], [place] [address]    Report Status 10/21/2022 FINAL  Final  Blood Culture (routine x 2)     Status: Abnormal   Collection Time: 10/18/22  4:04 AM   Specimen: BLOOD RIGHT ARM  Result Value Ref Range Status   Specimen Description   Final    BLOOD RIGHT ARM BOTTLES DRAWN AEROBIC AND ANAEROBIC Performed at [facility], [address], [place], [place] [address]    Special Requests   Final    Blood Culture adequate volume Performed at [facility], [address], [place], [place] [address]    Culture  Setup Time   Final    GRAM POSITIVE COCCI IN PAIRS IN BOTH AEROBIC AND ANAEROBIC BOTTLES CRITICAL RESULT CALLED TO, READ BACK BY AND VERIFIED WITH: PHARMD J. LEDFORD 10/18/22 @  2301 BY AB Performed at [facility], [address], [place], [place] [address]    Culture STREPTOCOCCUS PNEUMONIAE (A)  Final   Report Status 10/21/2022 FINAL  Final   Organism ID, Bacteria STREPTOCOCCUS PNEUMONIAE  Final      Susceptibility   Streptococcus pneumoniae - MIC*    ERYTHROMYCIN >=8 RESISTANT Resistant     LEVOFLOXACIN <=0.25 SENSITIVE Sensitive     VANCOMYCIN 0.5 SENSITIVE Sensitive     PENICILLIN (meningitis) 1 RESISTANT Resistant     PENO - penicillin 1      PENICILLIN (non-meningitis) 1 SENSITIVE Sensitive     PENICILLIN (oral) 1 INTERMEDIATE Intermediate     CEFTRIAXONE (non-meningitis) 1 SENSITIVE Sensitive     CEFTRIAXONE (meningitis) 1 INTERMEDIATE Intermediate     * STREPTOCOCCUS PNEUMONIAE  Blood Culture ID Panel (Reflexed)     Status: Abnormal   Collection Time: 10/18/22  4:04 AM  Result Value Ref Range Status   Enterococcus faecalis NOT DETECTED NOT DETECTED Final   Enterococcus Faecium NOT DETECTED NOT DETECTED Final   Listeria monocytogenes NOT DETECTED NOT DETECTED Final   Staphylococcus species NOT DETECTED NOT DETECTED Final   Staphylococcus aureus (BCID) NOT DETECTED NOT DETECTED Final   Staphylococcus epidermidis NOT DETECTED NOT DETECTED Final   Staphylococcus lugdunensis NOT DETECTED NOT DETECTED Final   Streptococcus species DETECTED (A) NOT DETECTED Final    Comment: CRITICAL RESULT CALLED TO, READ BACK BY AND VERIFIED WITH:  PHARMD J. LEDFORD 10/18/22 @ 2301 BY AB    Streptococcus agalactiae NOT DETECTED NOT DETECTED Final   Streptococcus pneumoniae DETECTED (A) NOT DETECTED Final    Comment: CRITICAL RESULT CALLED TO, READ BACK BY AND VERIFIED WITH: PHARMD J. LEDFORD 10/18/22 @ 2301 BY AB    Streptococcus pyogenes NOT DETECTED NOT DETECTED Final   A.calcoaceticus-baumannii NOT DETECTED NOT DETECTED Final   Bacteroides fragilis NOT DETECTED NOT DETECTED Final   Enterobacterales NOT DETECTED NOT DETECTED Final   Enterobacter cloacae  complex NOT DETECTED NOT DETECTED Final   Escherichia coli NOT DETECTED NOT DETECTED Final   Klebsiella aerogenes NOT DETECTED NOT DETECTED Final   Klebsiella oxytoca NOT DETECTED NOT DETECTED Final   Klebsiella pneumoniae NOT DETECTED NOT DETECTED Final   Proteus species NOT DETECTED NOT DETECTED Final   Salmonella species NOT DETECTED NOT DETECTED Final   Serratia marcescens NOT DETECTED NOT DETECTED Final   Haemophilus influenzae NOT DETECTED NOT DETECTED Final   Neisseria meningitidis NOT DETECTED NOT DETECTED Final   Pseudomonas aeruginosa NOT DETECTED NOT DETECTED Final   Stenotrophomonas maltophilia NOT DETECTED NOT DETECTED Final   Candida albicans NOT DETECTED NOT DETECTED Final   Candida auris NOT DETECTED NOT DETECTED Final   Candida glabrata NOT DETECTED NOT DETECTED Final   Candida krusei NOT DETECTED NOT DETECTED Final   Candida parapsilosis NOT DETECTED NOT DETECTED Final   Candida tropicalis NOT DETECTED NOT DETECTED Final   Cryptococcus neoformans/gattii NOT DETECTED NOT DETECTED Final    Comment: Performed at [facility], [address], [place], [place] [address]  Urine Culture     Status: Abnormal   Collection Time: 10/18/22  7:25 AM   Specimen: Urine, Random  Result Value Ref Range Status   Specimen Description   Final    URINE, RANDOM Performed at [facility], [address], [place], [place] [address]    Special Requests   Final    NONE Reflexed from [id number] Performed at [facility], [address], [place], [place] [address]    Culture >=100,000 COLONIES/mL PSEUDOMONAS AERUGINOSA (A)  Final   Report Status 10/21/2022 FINAL  Final   Organism ID, Bacteria PSEUDOMONAS AERUGINOSA (A)  Final      Susceptibility   Pseudomonas aeruginosa - MIC*    CEFTAZIDIME 4 SENSITIVE Sensitive     CIPROFLOXACIN <=0.25 SENSITIVE Sensitive     GENTAMICIN <=1 SENSITIVE Sensitive     IMIPENEM 2 SENSITIVE Sensitive     PIP/TAZO 8  SENSITIVE Sensitive     CEFEPIME 4 SENSITIVE Sensitive     * >=100,000 COLONIES/mL PSEUDOMONAS AERUGINOSA  Culture, blood (Routine X 2) w Reflex to ID Panel     Status: None (Preliminary result)   Collection Time: 10/20/22  5:37 AM   Specimen: BLOOD  Result Value Ref Range Status   Specimen Description BLOOD BLOOD RIGHT HAND  Final   Special Requests   Final    BOTTLES DRAWN AEROBIC AND ANAEROBIC Blood Culture adequate volume   Culture   Final    NO GROWTH 3 DAYS Performed at [facility], [address] [place], [place] [address]    Report Status PENDING  Incomplete  Culture, blood (Routine X 2) w Reflex to ID Panel     Status: None (Preliminary result)   Collection Time: 10/20/22  5:37 AM   Specimen: BLOOD  Result Value Ref Range Status   Specimen Description BLOOD BLOOD RIGHT WRIST  Final  Special Requests   Final    BOTTLES DRAWN AEROBIC AND ANAEROBIC Blood Culture adequate volume   Culture   Final    NO GROWTH 3 DAYS Performed at [facility], [address], [place], [place] [address]    Report Status PENDING  Incomplete  Expectorated Sputum Assessment w Gram Stain, Rflx to Resp Cult     Status: None   Collection Time: 10/20/22 12:05 PM   Specimen: Expectorated Sputum  Result Value Ref Range Status   Specimen Description EXPECTORATED SPUTUM  Final   Special Requests Immunocompromised  Final   Sputum evaluation   Final    THIS SPECIMEN IS ACCEPTABLE FOR SPUTUM CULTURE Performed at [facility], [address] [place], [place] [address]    Report Status 10/20/2022 FINAL  Final  Culture, Respiratory w Gram Stain     Status: None   Collection Time: 10/20/22 12:05 PM  Result Value Ref Range Status   Specimen Description EXPECTORATED SPUTUM  Final   Special Requests Immunocompromised Reflexed from [id number]  Final   Gram Stain   Final    ABUNDANT WBC PRESENT, PREDOMINANTLY PMN NO ORGANISMS SEEN    Culture   Final    FEW Normal respiratory flora-no  Staph aureus or Pseudomonas seen Performed at [facility], [address], [place], [place] [address]    Report Status 10/22/2022 FINAL  Final    RADIOLOGY STUDIES/RESULTS: No results found.   LOS: 5 days   Jeoffrey Massed, MD  [facility]    To contact the attending provider between 7A-7P or the covering provider during after hours 7P-7A, please log into the web site [email or web address] and access using universal [place] password for that web site. If you do not have the password, please call the hospital operator.  10/23/2022, 11:06 AM

## 2022-10-23 NOTE — TOC Progression Note (Signed)
Transition of Care [facility]) - Progression Note    Patient Details  Name: Kayla [name] MRN: [id number] Date of Birth: [birth year]  Transition of Care [facility]) CM/SW Contact  Gordy Clement, RN Phone Number: 10/23/2022, 2:30 PM  Clinical Narrative:     CM called and spoke with Husband regarding change of plans for higher dose concentrator.  Patient is now weaned down to 3-4L O2 and  no longer needs concentrator with capacity higher than 5L. Husband in agreement and is ok with this. [facility] (HOP) have been notified that patient will most likely dc tomorrow   Expected Discharge Plan: Home w Home Health Services Barriers to Discharge: Continued Medical Work up  Expected Discharge Plan and Services   Discharge Planning Services: CM Consult Post Acute Care Choice: NA (TBD) Living arrangements for the past 2 months: Single Family Home                 DME Arranged: N/A DME Agency: [employer] (Home O2)       HH Arranged: RN, PT HH Agency: [facility] Date HH Agency Contacted: 10/20/22 Time HH Agency Contacted: 1528 Representative spoke with at [facility] Agency: [facility] - liaison   Social Determinants of Health (SDOH) Interventions SDOH Screenings   Food Insecurity: No Food Insecurity (09/26/2022)  Housing: Low Risk  (09/16/2022)  Transportation Needs: No Transportation Needs (09/26/2022)  Utilities: Not At Risk (09/16/2022)  Alcohol Screen: Low Risk  (07/07/2022)  Depression (PHQ2-9): Medium Risk (07/14/2022)  Financial Resource Strain: Low Risk  (07/07/2022)  Physical Activity: Unknown (07/07/2022)  Recent Concern: Physical Activity - Inactive (07/07/2022)  Social Connections: Socially Integrated (07/07/2022)  Stress: No Stress Concern Present (07/07/2022)  Tobacco Use: Low Risk  (10/01/2022)    Readmission Risk Interventions    09/18/2022    2:59 PM 09/02/2022    5:18 PM 06/20/2022    3:58 PM  Readmission Risk Prevention Plan  Post Dischage Appt    Complete  Medication Screening   Complete  Transportation Screening Complete Complete Complete  PCP or Specialist Appt within 5-7 Days Complete Complete   Home Care Screening Complete Complete   Medication Review (RN CM) Complete Complete

## 2022-10-23 NOTE — Plan of Care (Signed)

## 2022-10-23 NOTE — Plan of Care (Signed)
  Problem: Fluid Volume: Goal: Hemodynamic stability will improve Outcome: Progressing   Problem: Clinical Measurements: Goal: Diagnostic test results will improve Outcome: Progressing Goal: Signs and symptoms of infection will decrease Outcome: Progressing   Problem: Respiratory: Goal: Ability to maintain adequate ventilation will improve Outcome: Progressing   Problem: Education: Goal: Knowledge of General Education information will improve Description: Including pain rating scale, medication(s)/side effects and non-pharmacologic comfort measures Outcome: Progressing   Problem: Health Behavior/Discharge Planning: Goal: Ability to manage health-related needs will improve Outcome: Progressing   Problem: Clinical Measurements: Goal: Ability to maintain clinical measurements within normal limits will improve Outcome: Progressing Goal: Diagnostic test results will improve Outcome: Progressing Goal: Respiratory complications will improve Outcome: Progressing   Problem: Activity: Goal: Risk for activity intolerance will decrease Outcome: Progressing   Problem: Nutrition: Goal: Adequate nutrition will be maintained Outcome: Progressing   Problem: Elimination: Goal: Will not experience complications related to bowel motility Outcome: Progressing

## 2022-10-23 NOTE — Progress Notes (Signed)
Physical Therapy Treatment Patient Details Name: Kayla [name] MRN: [id number] DOB: 1950-08-21 Today's Date: 10/23/2022   History of Present Illness 72 y.o. female brought in 10/18/22 because of worsening shortness of breath which has been progressive over past three weeks. Aspiration pna; PMH: OA, cervical cancer, COPD, asthma, MS, PNA, OSA, hypothyroidism, osteoporosis, R THA, R hip closed reduction manipulation; vocal cord paralysis    PT Comments  Patient able to ambulate 100 ft with RW on 4L  O2 with sats >95% throughout. Gait is slow but steady. Better upright posture compared to previous session.     If plan is discharge home, recommend the following: A little help with walking and/or transfers;Assistance with cooking/housework;Help with stairs or ramp for entrance   Can travel by private vehicle        Equipment Recommendations  None recommended by PT    Recommendations for Other Services       Precautions / Restrictions Precautions Precautions: Fall Restrictions Weight Bearing Restrictions: No     Mobility  Bed Mobility Overal bed mobility: Needs Assistance Bed Mobility: Supine to Sit     Supine to sit: Min assist     General bed mobility comments: min A to eleavte trunk    Transfers Overall transfer level: Needs assistance Equipment used: Rolling walker (2 wheels) Transfers: Sit to/from Stand Sit to Stand: Min guard           General transfer comment: pt uses one hand on RW and one to push off bed    Ambulation/Gait Ambulation/Gait assistance: Min guard Gait Distance (Feet): 100 Feet Assistive device: Rolling walker (2 wheels) Gait Pattern/deviations: Decreased stride length, Decreased dorsiflexion - right, Steppage, Step-through pattern Gait velocity: decr     General Gait Details: slow gait with RW support, noted internal rotation of RLE and foot and decreased DF on R(baseline) no steadying assist needed; minguard for safety and  lines/O2   Stairs             Wheelchair Mobility     Tilt Bed    Modified Rankin (Stroke Patients Only)       Balance Overall balance assessment: Modified Independent                                          Cognition Arousal/Alertness: Awake/alert Behavior During Therapy: WFL for tasks assessed/performed Overall Cognitive Status: Within Functional Limits for tasks assessed                                 General Comments: soft spoken        Exercises      General Comments General comments (skin integrity, edema, etc.): on 4L with sats >95% while ambulating      Pertinent Vitals/Pain Pain Assessment Pain Assessment: No/denies pain    Home Living                          Prior Function            PT Goals (current goals can now be found in the care plan section) Acute Rehab PT Goals Patient Stated Goal: return home Time For Goal Achievement: 11/03/22 Potential to Achieve Goals: Good Progress towards PT goals: Progressing toward goals    Frequency    Min 1X/week  PT Plan Current plan remains appropriate    Co-evaluation              AM-PAC PT "6 Clicks" Mobility   Outcome Measure  Help needed turning from your back to your side while in a flat bed without using bedrails?: None Help needed moving from lying on your back to sitting on the side of a flat bed without using bedrails?: A Little Help needed moving to and from a bed to a chair (including a wheelchair)?: A Little Help needed standing up from a chair using your arms (e.g., wheelchair or bedside chair)?: A Little Help needed to walk in hospital room?: A Little Help needed climbing 3-5 steps with a railing? : A Little 6 Click Score: 19    End of Session Equipment Utilized During Treatment: Oxygen;Gait belt Activity Tolerance: Patient tolerated treatment well Patient left: with call bell/phone within reach;with chair alarm  set;in chair Nurse Communication: Mobility status PT Visit Diagnosis: Other abnormalities of gait and mobility (R26.89)     Time: 6045-4098 PT Time Calculation (min) (ACUTE ONLY): 24 min  Charges:    $Gait Training: 23-37 mins PT General Charges $$ ACUTE PT VISIT: 1 Visit                      Jerolyn Center, PT Acute Rehabilitation Services  Office [phone]    Zena Amos 10/23/2022, 11:41 AM

## 2022-10-24 ENCOUNTER — Telehealth: Payer: Self-pay

## 2022-10-24 ENCOUNTER — Other Ambulatory Visit (HOSPITAL_COMMUNITY): Payer: Self-pay

## 2022-10-24 DIAGNOSIS — E871 Hypo-osmolality and hyponatremia: Secondary | ICD-10-CM | POA: Diagnosis not present

## 2022-10-24 DIAGNOSIS — J9621 Acute and chronic respiratory failure with hypoxia: Secondary | ICD-10-CM

## 2022-10-24 DIAGNOSIS — J189 Pneumonia, unspecified organism: Secondary | ICD-10-CM | POA: Diagnosis not present

## 2022-10-24 DIAGNOSIS — J9601 Acute respiratory failure with hypoxia: Secondary | ICD-10-CM

## 2022-10-24 MED ORDER — AMOXICILLIN-POT CLAVULANATE 875-125 MG PO TABS
ORAL_TABLET | ORAL | 3 refills | Status: DC
  Filled 2022-10-24: qty 60, 52d supply, fill #0
  Filled 2022-12-12: qty 60, 60d supply, fill #1
  Filled 2022-12-16: qty 60, 52d supply, fill #0

## 2022-10-24 NOTE — Discharge Summary (Signed)
PATIENT DETAILS Name: Kayla [name] Age: 72 y.o. Sex: female Date of Birth: Jan 09, 1951 MRN: [id number]. Admitting Physician: No admitting provider for patient encounter. FAO:ZHYQM, Bernadene Bell, MD  Admit Date: 10/18/2022 Discharge date: 10/24/2022  Recommendations for Outpatient Follow-up:  Follow up with PCP in 1-2 weeks Please obtain CMP/CBC in one week Ensure follow-up with ID clinic Repeat CXR in 6 to 8 weeks  Admitted From:  Home  Disposition: Home health   Discharge Condition: good  CODE STATUS:   Code Status: Full Code   Diet recommendation:  Diet Order             Diet - low sodium heart healthy           Diet regular Room service appropriate? Yes; Fluid consistency: Thin  Diet effective now                    Brief Summary: Patient is a 72 y.o.  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   Significant events: 7/27>> admit to [facility]   Significant studies: 7/27>> CXR: Right upper lobe infiltrate.   Significant microbiology data: 7/27>> blood culture: Streptococcus pneumoniae   Procedures: None   Consults: Infectious disease  Brief Hospital Course: Acute on chronic hypoxic respiratory failure due to recurrent streptococcal pneumonia involving right upper lobe Recurrent streptococcal pneumonia bacteremia Gradually improved with initially IV antibiotics-then with Augmentin, she is now down to just 3 L of oxygen-Home regimen is usually 2-3 L.   She feels much better-and was able to mobilize with therapy yesterday Appreciate infectious disease  input-recommendations are to continue Augmentin twice daily until 8/10-and then to transition to once daily dosing for prophylaxis. Stable for discharge today with close follow-up with ID clinic and with PCP.  PCP to consider repeating chest x-ray in 6-8 weeks to document resolution of pneumonia   History of multiple sclerosis Neurogenic bladder Recent MRI brain/C-spine/T-spine done as an outpatient-no active lesion Does intermittent in/out cath as an outpatient-currently with indwelling Foley which he prefers when she is hospitalized. PT/OT Outpatient follow-up with neurology   Hypogammaglobinemia On IVIG therapy as an outpatient   Hyponatremia Mild Supportive care   Hypothyroidism Synthroid   GERD PPI   Normocytic anemia Likely due to chronic disease Follow CBC periodically   Thrombocytosis Likely reactive Follow CBC periodically   Asymptomatic bacteriuria No symptoms of UTI-already on antibiotics   OSA on CPAP   Nutrition Status: Nutrition Problem: Severe Malnutrition Etiology: chronic illness (multiple sclerosis, immunodeficiency, recurrent CAP) Signs/Symptoms: severe fat depletion, severe muscle depletion, percent weight loss Percent weight loss: 15.4 % Interventions: Boost Breeze, MVI, Prostat   Underweight: Estimated body mass index is 19.33 kg/m as calculated from the following:   Height as of 09/30/22: 5\' 3"  (1.6 m).   Weight as of this encounter: 49.5 kg.    Discharge Diagnoses:  Principal Problem:   Acute on chronic respiratory failure with hypoxia (HCC) Active Problems:   Hypothyroidism   Multiple sclerosis (HCC)   Hyponatremia   GERD (gastroesophageal reflux disease)   OSA on CPAP   Chronic obstructive pulmonary disease (HCC)   HCAP (healthcare-associated pneumonia)   Hypogammaglobulinemia (HCC)   Aspiration  pneumonia (HCC)   Normocytic anemia   Thrombocytosis   Mild protein malnutrition (HCC)   Asymptomatic bacteriuria   Discharge  Instructions:  Activity:  As tolerated with Full fall precautions use walker/cane & assistance as needed   Discharge Instructions     Call MD for:  difficulty breathing, headache or visual disturbances   Complete by: As directed    Call MD for:  extreme fatigue   Complete by: As directed    Call MD for:  persistant dizziness or light-headedness   Complete by: As directed    Call MD for:  redness, tenderness, or signs of infection (pain, swelling, redness, odor or green/yellow discharge around incision site)   Complete by: As directed    Diet - low sodium heart healthy   Complete by: As directed    Discharge instructions   Complete by: As directed    Follow with Primary MD  [name], MD in 1-2 weeks  Please follow-up with infectious disease clinic as instructed  Follow-up with immunologist for IVIG infusions as previously scheduled  Please ask your primary care practitioner to repeat a two-view chest x-ray in 6 to 8 weeks to document resolution of pneumonia  Please get a complete blood count and chemistry panel checked by your Primary MD at your next visit, and again as instructed by your Primary MD.  Get Medicines reviewed and adjusted: Please take all your medications with you for your next visit with your Primary MD  Laboratory/radiological data: Please request your Primary MD to go over all hospital tests and procedure/radiological results at the follow up, please ask your Primary MD to get all Hospital records sent to his/her office.  In some cases, they will be blood work, cultures and biopsy results pending at the time of your discharge. Please request that your primary care M.D. follows up on these results.  Also Note the following: If you experience worsening of your admission symptoms, develop shortness of breath, life threatening emergency, suicidal or homicidal thoughts you must seek medical attention immediately by calling 911 or calling your MD immediately   if symptoms less severe.  You must read complete instructions/literature along with all the possible adverse reactions/side effects for all the Medicines you take and that have been prescribed to you. Take any new Medicines after you have completely understood and accpet all the possible adverse reactions/side effects.   Do not drive when taking Pain medications or sleeping medications (Benzodaizepines)  Do not take more than prescribed Pain, Sleep and Anxiety Medications. It is not advisable to combine anxiety,sleep and pain medications without talking with your primary care practitioner  Special Instructions: If you have smoked or chewed Tobacco  in the last 2 yrs please stop smoking, stop any regular Alcohol  and or any Recreational drug use.  Wear Seat belts while driving.  Please note: You were cared for by a hospitalist during your hospital stay. Once you are discharged, your primary care physician will handle any further medical issues. Please note that NO REFILLS for any discharge medications will be authorized once you are discharged, as it is imperative that you return to your primary care physician (or establish a relationship with a primary care physician if you do not have one) for your post hospital discharge needs so that they can reassess your need for medications and monitor your lab values.   Increase activity slowly   Complete by: As directed       Allergies as of 10/24/2022  Reactions   Tramadol Other (See Comments)   Hallucinations   Contrast Media [iodinated Contrast Media] Rash   Iohexol Hives   Morphine And Codeine Other (See Comments)   Hallucinations    Methylprednisolone Other (See Comments)   Decreases pts heart rate    Hydrocodone Itching   Oxycodone-acetaminophen Rash, Other (See Comments)   Hallucinations, also   Phenytoin Sodium Extended Rash   Zanaflex [tizanidine Hydrochloride] Rash        Medication List     STOP taking these medications     docusate sodium 100 MG capsule Commonly known as: COLACE   Trelegy Ellipta 100-62.5-25 MCG/ACT Aepb Generic drug: Fluticasone-Umeclidin-Vilant   trimethoprim 100 MG tablet Commonly known as: TRIMPEX       TAKE these medications    ACCRUFeR 30 MG Caps Generic drug: Ferric Maltol Take 1 capsule (30 mg total) by mouth in the morning and at bedtime. What changed:  when to take this additional instructions   albuterol (2.5 MG/3ML) 0.083% nebulizer solution Commonly known as: PROVENTIL Inhale 3 mLs (2.5 mg total) by nebulization every 4 (four) hours as needed for wheezing or shortness of breath. What changed:  when to take this additional instructions   amoxicillin-clavulanate 875-125 MG tablet Commonly known as: AUGMENTIN 1 tablet p.o. twice daily till 8/10, then 1 tablet p.o. daily indefinitely.   baclofen 10 MG tablet Commonly known as: LIORESAL Take 10 mg by mouth in the morning.   bisacodyl 5 MG EC tablet Commonly known as: DULCOLAX Take 5 mg by mouth See admin instructions. Take 5 mg by mouth in the morning and evening   budesonide 0.5 MG/2ML nebulizer solution Commonly known as: Pulmicort Use one vial in the nebulizer twice daily What changed:  how much to take how to take this when to take this additional instructions   calcium carbonate 1250 (500 Ca) MG tablet Commonly known as: OS-CAL - dosed in mg of elemental calcium Take 1 tablet (1,250 mg total) by mouth daily with breakfast.   dexlansoprazole 60 MG capsule Commonly known as: DEXILANT TAKE 1 CAPSULE BY MOUTH ONCE DAILY WITH BREAKFAST What changed: when to take this   famotidine 20 MG tablet Commonly known as: Pepcid One after supper What changed:  how much to take how to take this when to take this additional instructions   FLUoxetine 40 MG capsule Commonly known as: PROZAC Take 1 capsule (40 mg total) by mouth daily. What changed: when to take this   Flutter Devi Use as  directed What changed:  how much to take how to take this when to take this additional instructions   formoterol 20 MCG/2ML nebulizer solution Commonly known as: PERFOROMIST Use one vial in the nebulizer twice daily What changed:  how much to take how to take this when to take this additional instructions   guaiFENesin 600 MG 12 hr tablet Commonly known as: MUCINEX Take 1 tablet (600 mg total) by mouth 2 (two) times daily. What changed:  when to take this additional instructions   levothyroxine 100 MCG tablet Commonly known as: SYNTHROID TAKE 1 TABLET BY MOUTH ONCE  DAILY BEFORE BREAKFAST   MAGNESIUM PO Take 1 tablet by mouth every evening.   methenamine 1 g tablet Commonly known as: HIPREX Take 1 tablet (1 g total) by mouth 2 (two) times daily with a meal. What changed:  when to take this additional instructions   mirtazapine 15 MG tablet Commonly known as: REMERON Take 1 tablet (15 mg total)  by mouth at bedtime. What changed: when to take this   Myrbetriq 50 MG Tb24 tablet Generic drug: mirabegron ER Take 50 mg by mouth in the morning.   NON FORMULARY Take 1 tablet by mouth See admin instructions. [employer] Mature Multivitamin with Calcium- Take 1 tablet by mouth with breakfast in the morning   NON FORMULARY Take 1 capsule by mouth See admin instructions. [employer] probiotic advanced  digestive probiotic- Take 1 capsule by mouth in the morning   nystatin 100000 UNIT/ML suspension Commonly known as: MYCOSTATIN Swish, gargle, and swallow 5 mL after budesonide nebulizer treatment.   OXYGEN Inhale 2-3.5 L/min into the lungs continuous.   PRESCRIPTION MEDICATION Apply 1 Device topically See admin instructions. Afflovest- For 30 minutes in the morning and evening- to begin @ 60% and work up to 80%   sodium chloride HYPERTONIC 3 % nebulizer solution Take by nebulization 2 (two) times daily as needed for cough. Dx J44.9 What changed:  how much to  take when to take this additional instructions   tamsulosin 0.4 MG Caps capsule Commonly known as: FLOMAX Take 0.4 mg by mouth in the morning.   temazepam 15 MG capsule Commonly known as: RESTORIL Take 1 capsule (15 mg total) by mouth at bedtime. What changed: when to take this   torsemide 10 MG tablet Commonly known as: DEMADEX Take 1 tablet (10 mg total) by mouth daily. What changed: when to take this   Vitaline CoQ10 300 MG Wafr Generic drug: Coenzyme Q10 Take 300 mg by mouth in the morning.   Yupelri 175 MCG/3ML nebulizer solution Generic drug: revefenacin Use one vial in the nebulizer once daily        Follow-up Information     [facility] Follow up.   Specialty: Home Health Services Why: Your home health  agency Contact information: [address] [place] [address] [place] [address] [phone]         [facility] Follow up.   Why: Palliative care Contact information: [address] [facility] [place] [address] [phone]        [name], MD. Schedule an appointment as soon as possible for a visit in 1 week(s).   Specialty: Internal Medicine Contact information: [address] [place] [address] [phone]         Raymondo Band, MD Follow up on 11/26/2022.   Specialty: Infectious Diseases Why: appt at 1:45 pm Contact information: [address] [place] [address] [phone]         Jessica Priest, MD Follow up on 11/25/2022.   Specialty: Allergy and Immunology Why: appt at 3:20 pm Contact information: [address] [place] [address] [phone]                Allergies  Allergen Reactions   Tramadol Other (See Comments)    Hallucinations    Contrast Media [Iodinated Contrast Media] Rash   Iohexol Hives   Morphine And Codeine Other (See Comments)    Hallucinations    Methylprednisolone Other (See Comments)    Decreases pts heart rate     Hydrocodone Itching   Oxycodone-Acetaminophen Rash and Other (See Comments)    Hallucinations, also   Phenytoin Sodium Extended Rash   Zanaflex [Tizanidine Hydrochloride] Rash     Other Procedures/Studies: DG Chest Port 1 View  Result Date: 10/18/2022 CLINICAL DATA:  Worsening shortness of breath. EXAM: PORTABLE CHEST 1 VIEW COMPARISON:  September 16, 2022 FINDINGS: The heart size and mediastinal contours are within normal limits. Decreased lung volumes are seen with predominant stable chronic appearing increased interstitial lung markings. Predominantly stable mild to moderate severity bibasilar atelectasis and/or infiltrate is seen. Interval development of a new area of patchy mild to moderate severity infiltrate is seen within the upper right lung. Small, stable bilateral pleural effusions are seen. No pneumothorax is identified. The visualized skeletal structures are unremarkable. IMPRESSION: 1. New area of mild to moderate severity right upper lobe infiltrate. 2. Predominantly stable mild to moderate severity bibasilar atelectasis and/or infiltrate. 3. Small, stable bilateral pleural effusions. Electronically Signed   By: Aram Candela M.D.   On: 10/18/2022 04:28   MR CERVICAL SPINE W WO CONTRAST  Result Date: 10/16/2022 CLINICAL DATA:  Provided history: Multiple sclerosis. Additional history provided by the scanning technologist: Patient reports numbness in feet. EXAM: MRI CERVICAL SPINE WITHOUT AND WITH CONTRAST TECHNIQUE: Multiplanar and multiecho pulse sequences of the cervical spine, to include the craniocervical junction and cervicothoracic junction, were obtained without and with intravenous contrast. CONTRAST:  4 mL Vueway intravenous contrast. COMPARISON:  CT of the cervical spine 06/19/2022. Cervical spine MRI 08/13/2021. FINDINGS: Alignment: Levocurvature of the cervical spine. Grade 1 anterolisthesis at C4-C5, C7-T1 and T1-T2. Vertebrae: Cervical vertebral body height is maintained.  Mild degenerative endplate edema and enhancement at C6-C7. Cord: No signal abnormality is identified within the cervical spinal cord. No pathologic spinal cord enhancement. Posterior Fossa, vertebral arteries, paraspinal tissues: Posterior fossa assessed on same-day brain MRI. Flow voids preserved within the imaged cervical vertebral arteries. No paraspinal mass or collection. Disc levels: Unless otherwise stated, the level by level findings below have not significantly changed from the prior MRI of 08/13/2021. Multilevel disc degeneration, greatest at C5-C6 (advanced) and C6-C7 (moderate to advanced). Disc degeneration is progressed at C6-C7. C2-C3: Posterior disc osteophyte complex with bilateral disc osteophyte ridge/uncinate hypertrophy. No significant spinal canal or foraminal stenosis. C3-C4: Bulge. Uncovertebral hypertrophy on the left. Facet arthrosis and ligamentum flavum thickening. No significant spinal canal stenosis. Mild left neural foraminal narrowing. C4-C5: Grade 1 anterolisthesis. Facet arthrosis. No significant spinal canal or foraminal stenosis. C5-C6: Posterior disc osteophyte complex. Left-sided disc osteophyte ridge/uncinate hypertrophy. Mild effacement of the ventral thecal sac. Mild left neural foraminal narrowing. C6-C7: Shallow posterior disc osteophyte complex with left-sided disc osteophyte ridge/uncinate hypertrophy. Minimal effacement of the ventral thecal sac. Mild left neural foraminal narrowing. C7-T1: Grade 1 anterolisthesis. Mild facet arthrosis. No significant spinal canal or foraminal stenosis. IMPRESSION: 1. No lesions are identified within the cervical spinal cord. 2. Cervical spondylosis as outlined. Moderate-to-advanced C6-C7 disc degeneration has progressed from the prior MRI of 08/13/2021, and there is mild degenerative endplate edema at this level. Degenerative findings are otherwise unchanged from the prior MRI. No more than mild spinal canal or neural foraminal  narrowing. Advanced disc degeneration at C5-C6. 3. Levocurvature of the cervical spine. 4. Grade 1 anterolisthesis at C4-C5, C7-T1 and T1-T2. Electronically Signed   By: Jackey Loge D.O.   On: 10/16/2022 18:22   MR THORACIC SPINE W WO CONTRAST  Result Date: 10/16/2022 CLINICAL DATA:  Provided history: Multiple sclerosis. Additional history provided by the scanning technologist: The patient reports numbness in feet. EXAM: MRI THORACIC WITHOUT AND WITH CONTRAST TECHNIQUE: Multiplanar and multiecho pulse sequences of the thoracic spine were obtained without and with intravenous contrast. CONTRAST:  4 mL Vueway intravenous contrast. COMPARISON:  Thoracic spine MRI 08/13/2021. FINDINGS: Mild intermittent motion degradation. Within this limitation, findings are  as follows. Alignment:  No significant spondylolisthesis. Vertebrae: No thoracic vertebral compression fracture. Redemonstrated compression deformity versus Schmorl node within the L1 superior endplate. Multilevel vertebral hemangiomas. No significant marrow edema or focal suspicious osseous lesion. Cord: Within limitations of motion degradation, no signal abnormality is identified within the thoracic spinal cord. No pathologic spinal cord enhancement. Paraspinal and other soft tissues: Multifocal airspace consolidation, greatest within the mid to upper right lung (for instance as seen on series 8, image 14). Subcentimeter T2 hyperintense focus within the hepatic dome, nonspecific but unchanged from the prior MRI of 08/13/2021 and favored to reflect a cyst. Disc levels: Unless otherwise stated, the level by level findings below have not significantly changed from the prior MRI of 08/13/2021. Multilevel disc degeneration, greatest at T6-T7 (moderate/advanced) and T7-T8 (moderate). At T6-T7, there is a small right center disc protrusion which minimally effaces the ventral thecal sac. At T7-T8, there is a small left center disc protrusion which mildly effaces the  ventral thecal sac. At T8-T9, there is a very shallow broad-based left center disc protrusion without significant spinal canal stenosis. No significant foraminal stenosis. Impression #3 will be called to the ordering clinician or representative by the Radiologist Assistant, and communication documented in the PACS or [employer]. IMPRESSION: 1. Mildly motion degraded exam. Within this limitation, no lesions are identified within the thoracic spinal cord. 2. Thoracic spondylosis as outlined and unchanged from the prior thoracic spine MRI of 08/13/2021. No more than mild spinal canal narrowing. No significant foraminal stenosis. 3. Multifocal airspace consolidation, greatest within the mid to upper right lung. Findings likely reflect pneumonia. PA and lateral chest radiographs are recommended for further evaluation. Additionally, radiographic follow-up to resolution is recommended to exclude underlying malignancy. Electronically Signed   By: Jackey Loge D.O.   On: 10/16/2022 18:10   MR BRAIN W WO CONTRAST  Result Date: 10/16/2022 CLINICAL DATA:  Provided history: Multiple sclerosis. EXAM: MRI HEAD WITHOUT AND WITH CONTRAST TECHNIQUE: Multiplanar, multiecho pulse sequences of the brain and surrounding structures were obtained without and with intravenous contrast. CONTRAST:  4 mL Vueway intravenous contrast.  Head CT COMPARISON:  Head CT 06/19/2022.  Brain MRI 08/17/2021. FINDINGS: Brain: No age advanced or lobar predominant parenchymal atrophy. Multifocal T2 FLAIR hyperintense signal abnormality within the deep periventricular greater than juxtacortical cerebral white matter, as well as callososeptal interface, not appreciably changed from the prior brain MRI of 08/17/2021. No white matter lesion is identified within the posterior fossa. No pathologic enhancement to suggest active demyelination. There is no acute infarct. No evidence of an intracranial mass. No chronic intracranial blood products. No  extra-axial fluid collection. No midline shift. No pathologic intracranial enhancement identified. Vascular: Maintained flow voids within the proximal large arterial vessels. Skull and upper cervical spine: No focal suspicious marrow lesion. Sinuses/Orbits: No mass or acute finding within the imaged orbits. Prior bilateral ocular lens replacement. Peripheral T2 hyperintense foci within the bilateral maxillary sinuses, likely reflecting a combination of mucous retention cysts and mucosal thickening, overall moderate/severe. Moderate-volume frothy secretions within the left sphenoid sinus. Other: Small-volume fluid within the bilateral mastoid air cells. IMPRESSION: 1. Multifocal T2 FLAIR hyperintense signal abnormality within the supratentorial white matter compatible with the provided history of multiple sclerosis. Stable burden of white matter lesions as compared to the prior MRI of 08/17/2021. No evidence of active demyelination. 2. Paranasal sinus disease as described. 3. Small-volume fluid within the bilateral mastoid air cells. Electronically Signed   By: Jackey Loge D.O.  On: 10/16/2022 17:55     TODAY-DAY OF DISCHARGE:  Subjective:   Kayla [name] today has no headache,no chest abdominal pain,no new weakness tingling or numbness, feels much better wants to go home today.  Objective:   Blood pressure (!) 93/57, pulse 73, temperature 97.8 F (36.6 C), temperature source Oral, resp. rate 16, weight 47.3 kg, SpO2 97%.  Intake/Output Summary (Last 24 hours) at 10/24/2022 0956 Last data filed at 10/24/2022 0919 Gross per 24 hour  Intake --  Output 1700 ml  Net -1700 ml   Filed Weights   10/19/22 0900 10/23/22 0440 10/24/22 0024  Weight: 42.7 kg 49.5 kg 47.3 kg    Exam: Awake Alert, Oriented *3, No new F.N deficits, Normal affect [place].AT,PERRAL Supple Neck,No JVD, No cervical lymphadenopathy appriciated.  Symmetrical Chest wall movement, Good air movement bilaterally, CTAB RRR,No  Gallops,Rubs or new Murmurs, No Parasternal Heave +ve B.Sounds, Abd Soft, Non tender, No organomegaly appriciated, No rebound -guarding or rigidity. No Cyanosis, Clubbing or edema, No new Rash or bruise   PERTINENT RADIOLOGIC STUDIES: No results found.   PERTINENT LAB RESULTS: CBC: Recent Labs    10/22/22 0123 10/23/22 0547  WBC 8.5 9.4  HGB 9.3* 9.1*  HCT 28.8* 29.1*  PLT 614* 684*   CMET CMP     Component Value Date/Time   NA 132 (L) 10/23/2022 0547   K 4.0 10/23/2022 0547   CL 95 (L) 10/23/2022 0547   CO2 27 10/23/2022 0547   GLUCOSE 86 10/23/2022 0547   BUN 7 (L) 10/23/2022 0547   CREATININE 0.48 10/23/2022 0547   CALCIUM 9.0 10/23/2022 0547   PROT 4.7 (L) 10/21/2022 0128   PROT 6.2 09/30/2022 1230   ALBUMIN 1.9 (L) 10/21/2022 0128   ALBUMIN 4.1 09/30/2022 1230   AST 20 10/21/2022 0128   ALT 16 10/21/2022 0128   ALKPHOS 55 10/21/2022 0128   BILITOT 0.6 10/21/2022 0128   BILITOT <0.2 09/30/2022 1230   GFR 64.22 05/26/2022 1034   GFRNONAA >60 10/23/2022 0547    GFR Estimated Creatinine Clearance: 48.9 mL/min (by C-G formula based on SCr of 0.48 mg/dL). No results for input(s): "LIPASE", "AMYLASE" in the last 72 hours. No results for input(s): "CKTOTAL", "CKMB", "CKMBINDEX", "TROPONINI" in the last 72 hours. Invalid input(s): "POCBNP" No results for input(s): "DDIMER" in the last 72 hours. No results for input(s): "HGBA1C" in the last 72 hours. No results for input(s): "CHOL", "HDL", "LDLCALC", "TRIG", "CHOLHDL", "LDLDIRECT" in the last 72 hours. No results for input(s): "TSH", "T4TOTAL", "T3FREE", "THYROIDAB" in the last 72 hours.  Invalid input(s): "FREET3" No results for input(s): "VITAMINB12", "FOLATE", "FERRITIN", "TIBC", "IRON", "RETICCTPCT" in the last 72 hours. Coags: No results for input(s): "INR" in the last 72 hours.  Invalid input(s): "PT" Microbiology: Recent Results (from the past 240 hour(s))  Blood Culture (routine x 2)     Status:  Abnormal   Collection Time: 10/18/22  3:59 AM   Specimen: BLOOD RIGHT ARM  Result Value Ref Range Status   Specimen Description   Final    BLOOD RIGHT ARM BOTTLES DRAWN AEROBIC AND ANAEROBIC Performed at [facility], [address], [place], [place] [address]    Special Requests   Final    Blood Culture adequate volume Performed at [facility], [address] [place], [place] [address]    Culture  Setup Time   Final    GRAM POSITIVE COCCI IN BOTH AEROBIC AND ANAEROBIC BOTTLES CRITICAL VALUE NOTED.  VALUE  IS CONSISTENT WITH PREVIOUSLY REPORTED AND CALLED VALUE.    Culture (A)  Final    STREPTOCOCCUS PNEUMONIAE SUSCEPTIBILITIES PERFORMED ON PREVIOUS CULTURE WITHIN THE LAST 5 DAYS. Performed at [facility], [address], [place], [place] [address]    Report Status 10/21/2022 FINAL  Final  Blood Culture (routine x 2)     Status: Abnormal   Collection Time: 10/18/22  4:04 AM   Specimen: BLOOD RIGHT ARM  Result Value Ref Range Status   Specimen Description   Final    BLOOD RIGHT ARM BOTTLES DRAWN AEROBIC AND ANAEROBIC Performed at [facility], [address], [place], [place] [address]    Special Requests   Final    Blood Culture adequate volume Performed at [facility], [address] [place], [place] [address]    Culture  Setup Time   Final    GRAM POSITIVE COCCI IN PAIRS IN BOTH AEROBIC AND ANAEROBIC BOTTLES CRITICAL RESULT CALLED TO, READ BACK BY AND VERIFIED WITH: PHARMD J. LEDFORD 10/18/22 @ 2301 BY AB Performed at [facility], [address], [place], [place] [address]    Culture STREPTOCOCCUS PNEUMONIAE (A)  Final   Report Status 10/21/2022 FINAL  Final   Organism ID, Bacteria STREPTOCOCCUS PNEUMONIAE  Final      Susceptibility   Streptococcus pneumoniae - MIC*    ERYTHROMYCIN >=8 RESISTANT Resistant     LEVOFLOXACIN <=0.25 SENSITIVE Sensitive     VANCOMYCIN 0.5  SENSITIVE Sensitive     PENICILLIN (meningitis) 1 RESISTANT Resistant     PENO - penicillin 1      PENICILLIN (non-meningitis) 1 SENSITIVE Sensitive     PENICILLIN (oral) 1 INTERMEDIATE Intermediate     CEFTRIAXONE (non-meningitis) 1 SENSITIVE Sensitive     CEFTRIAXONE (meningitis) 1 INTERMEDIATE Intermediate     * STREPTOCOCCUS PNEUMONIAE  Blood Culture ID Panel (Reflexed)     Status: Abnormal   Collection Time: 10/18/22  4:04 AM  Result Value Ref Range Status   Enterococcus faecalis NOT DETECTED NOT DETECTED Final   Enterococcus Faecium NOT DETECTED NOT DETECTED Final   Listeria monocytogenes NOT DETECTED NOT DETECTED Final   Staphylococcus species NOT DETECTED NOT DETECTED Final   Staphylococcus aureus (BCID) NOT DETECTED NOT DETECTED Final   Staphylococcus epidermidis NOT DETECTED NOT DETECTED Final   Staphylococcus lugdunensis NOT DETECTED NOT DETECTED Final   Streptococcus species DETECTED (A) NOT DETECTED Final    Comment: CRITICAL RESULT CALLED TO, READ BACK BY AND VERIFIED WITH: PHARMD J. LEDFORD 10/18/22 @ 2301 BY AB    Streptococcus agalactiae NOT DETECTED NOT DETECTED Final   Streptococcus pneumoniae DETECTED (A) NOT DETECTED Final    Comment: CRITICAL RESULT CALLED TO, READ BACK BY AND VERIFIED WITH: PHARMD J. LEDFORD 10/18/22 @ 2301 BY AB    Streptococcus pyogenes NOT DETECTED NOT DETECTED Final   A.calcoaceticus-baumannii NOT DETECTED NOT DETECTED Final   Bacteroides fragilis NOT DETECTED NOT DETECTED Final   Enterobacterales NOT DETECTED NOT DETECTED Final   Enterobacter cloacae complex NOT DETECTED NOT DETECTED Final   Escherichia coli NOT DETECTED NOT DETECTED Final   Klebsiella aerogenes NOT DETECTED NOT DETECTED Final   Klebsiella oxytoca NOT DETECTED NOT DETECTED Final   Klebsiella pneumoniae NOT DETECTED NOT DETECTED Final   Proteus species NOT DETECTED NOT DETECTED Final   Salmonella species NOT DETECTED NOT DETECTED Final   Serratia marcescens NOT DETECTED  NOT DETECTED Final   Haemophilus influenzae NOT DETECTED NOT DETECTED  Final   Neisseria meningitidis NOT DETECTED NOT DETECTED Final   Pseudomonas aeruginosa NOT DETECTED NOT DETECTED Final   Stenotrophomonas maltophilia NOT DETECTED NOT DETECTED Final   Candida albicans NOT DETECTED NOT DETECTED Final   Candida auris NOT DETECTED NOT DETECTED Final   Candida glabrata NOT DETECTED NOT DETECTED Final   Candida krusei NOT DETECTED NOT DETECTED Final   Candida parapsilosis NOT DETECTED NOT DETECTED Final   Candida tropicalis NOT DETECTED NOT DETECTED Final   Cryptococcus neoformans/gattii NOT DETECTED NOT DETECTED Final    Comment: Performed at [facility], [address] [place], [place] [address]  Urine Culture     Status: Abnormal   Collection Time: 10/18/22  7:25 AM   Specimen: Urine, Random  Result Value Ref Range Status   Specimen Description   Final    URINE, RANDOM Performed at [facility], [address] [place], [place] [address]    Special Requests   Final    NONE Reflexed from [phone] Performed at [facility], [address] [place], [place] [address]    Culture >=100,000 COLONIES/mL PSEUDOMONAS AERUGINOSA (A)  Final   Report Status 10/21/2022 FINAL  Final   Organism ID, Bacteria PSEUDOMONAS AERUGINOSA (A)  Final      Susceptibility   Pseudomonas aeruginosa - MIC*    CEFTAZIDIME 4 SENSITIVE Sensitive     CIPROFLOXACIN <=0.25 SENSITIVE Sensitive     GENTAMICIN <=1 SENSITIVE Sensitive     IMIPENEM 2 SENSITIVE Sensitive     PIP/TAZO 8 SENSITIVE Sensitive     CEFEPIME 4 SENSITIVE Sensitive     * >=100,000 COLONIES/mL PSEUDOMONAS AERUGINOSA  Culture, blood (Routine X 2) w Reflex to ID Panel     Status: None (Preliminary result)   Collection Time: 10/20/22  5:37 AM   Specimen: BLOOD  Result Value Ref Range Status   Specimen Description BLOOD BLOOD RIGHT HAND  Final   Special Requests   Final    BOTTLES DRAWN AEROBIC AND  ANAEROBIC Blood Culture adequate volume   Culture   Final    NO GROWTH 4 DAYS Performed at [facility], [address], [place], [place] [address]    Report Status PENDING  Incomplete  Culture, blood (Routine X 2) w Reflex to ID Panel     Status: None (Preliminary result)   Collection Time: 10/20/22  5:37 AM   Specimen: BLOOD  Result Value Ref Range Status   Specimen Description BLOOD BLOOD RIGHT WRIST  Final   Special Requests   Final    BOTTLES DRAWN AEROBIC AND ANAEROBIC Blood Culture adequate volume   Culture   Final    NO GROWTH 4 DAYS Performed at [facility], [address] [place], [place] [address]    Report Status PENDING  Incomplete  Expectorated Sputum Assessment w Gram Stain, Rflx to Resp Cult     Status: None   Collection Time: 10/20/22 12:05 PM   Specimen: Expectorated Sputum  Result Value Ref Range Status   Specimen Description EXPECTORATED SPUTUM  Final   Special Requests Immunocompromised  Final   Sputum evaluation   Final    THIS SPECIMEN IS ACCEPTABLE FOR SPUTUM CULTURE Performed at [facility], [address], [place], [place] [address]    Report Status 10/20/2022 FINAL  Final  Culture, Respiratory w Gram Stain     Status: None   Collection Time: 10/20/22 12:05 PM  Result Value Ref Range  Status   Specimen Description EXPECTORATED SPUTUM  Final   Special Requests Immunocompromised Reflexed from [id number]  Final   Gram Stain   Final    ABUNDANT WBC PRESENT, PREDOMINANTLY PMN NO ORGANISMS SEEN    Culture   Final    FEW Normal respiratory flora-no Staph aureus or Pseudomonas seen Performed at [facility], [address], [place], [place] [address]    Report Status 10/22/2022 FINAL  Final    FURTHER DISCHARGE INSTRUCTIONS:  Get Medicines reviewed and adjusted: Please take all your medications with you for your next visit with your Primary MD  Laboratory/radiological data: Please request your Primary MD to go over all  hospital tests and procedure/radiological results at the follow up, please ask your Primary MD to get all Hospital records sent to his/her office.  In some cases, they will be blood work, cultures and biopsy results pending at the time of your discharge. Please request that your primary care M.D. goes through all the records of your hospital data and follows up on these results.  Also Note the following: If you experience worsening of your admission symptoms, develop shortness of breath, life threatening emergency, suicidal or homicidal thoughts you must seek medical attention immediately by calling 911 or calling your MD immediately  if symptoms less severe.  You must read complete instructions/literature along with all the possible adverse reactions/side effects for all the Medicines you take and that have been prescribed to you. Take any new Medicines after you have completely understood and accpet all the possible adverse reactions/side effects.   Do not drive when taking Pain medications or sleeping medications (Benzodaizepines)  Do not take more than prescribed Pain, Sleep and Anxiety Medications. It is not advisable to combine anxiety,sleep and pain medications without talking with your primary care practitioner  Special Instructions: If you have smoked or chewed Tobacco  in the last 2 yrs please stop smoking, stop any regular Alcohol  and or any Recreational drug use.  Wear Seat belts while driving.  Please note: You were cared for by a hospitalist during your hospital stay. Once you are discharged, your primary care physician will handle any further medical issues. Please note that NO REFILLS for any discharge medications will be authorized once you are discharged, as it is imperative that you return to your primary care physician (or establish a relationship with a primary care physician if you do not have one) for your post hospital discharge needs so that they can reassess your need for  medications and monitor your lab values.  Total Time spent coordinating discharge including counseling, education and face to face time equals greater than 30 minutes.  SignedJeoffrey Massed 10/24/2022 9:56 AM

## 2022-10-24 NOTE — Plan of Care (Signed)
Patient discharging home with spouse and o2 tank and HH PT/RN

## 2022-10-24 NOTE — Progress Notes (Signed)
Occupational Therapy Treatment Patient Details Name: NAJAI WASZAK MRN: [id number] DOB: October 13, 1950 Today's Date: 10/24/2022   History of present illness 72 y.o. female brought in 10/18/22 because of worsening shortness of breath which has been progressive over past three weeks. Aspiration pna; PMH: OA, cervical cancer, COPD, asthma, MS, PNA, OSA, hypothyroidism, osteoporosis, R THA, R hip closed reduction manipulation; vocal cord paralysis   OT comments  Pt completed toileting and grooming tasks at the sink with min assist using the RW for support.  Oxygen sats greater than 92% throughout on 3Ls nasal cannula.  Pt with increased WOB and dyspnea 2/4.  She lives with her spouse who can provide 24 hour supervision/assist.  Recommend continued acute care OT at this time to continue progression of ADLs.  Post acute HHOT recommended.   Recommendations for follow up therapy are one component of a multi-disciplinary discharge planning process, led by the attending physician.  Recommendations may be updated based on patient status, additional functional criteria and insurance authorization.    Assistance Recommended at Discharge Frequent or constant Supervision/Assistance  Patient can return home with the following  A little help with walking and/or transfers;A little help with bathing/dressing/bathroom;Assistance with cooking/housework;Assist for transportation;Help with stairs or ramp for entrance   Equipment Recommendations  None recommended by OT       Precautions / Restrictions Precautions Precautions: Fall Restrictions Weight Bearing Restrictions: No       Mobility Bed Mobility Overal bed mobility: Needs Assistance Bed Mobility: Supine to Sit     Supine to sit: Min guard, HOB elevated Sit to supine: Min guard        Transfers Overall transfer level: Needs assistance Equipment used: Rolling walker (2 wheels) Transfers: Sit to/from Stand Sit to Stand: Min assist            General transfer comment: Min assist from the lower toilet with rail  and from the bed     Balance Overall balance assessment: Needs assistance           Standing balance-Leahy Scale: Poor Standing balance comment: Pt needs UE support for mobility                           ADL either performed or assessed with clinical judgement   ADL Overall ADL's : Needs assistance/impaired     Grooming: Wash/dry face;Oral care;Min guard;Standing               Lower Body Dressing: Maximal assistance Lower Body Dressing Details (indicate cue type and reason): to donn gripper socks Toilet Transfer: Min guard;Ambulation   Toileting- Clothing Manipulation and Hygiene: Minimal assistance;Sit to/from stand       Functional mobility during ADLs: Minimal assistance;Rolling walker (2 wheels) General ADL Comments: Pt pleasant throughout session with spouse present.  BP at 104/69 in sitting with HR at 78 BPM at rest increasing to 110 BPM after transfer to the toilet with use of the RW.      Cognition Arousal/Alertness: Awake/alert Behavior During Therapy: WFL for tasks assessed/performed Overall Cognitive Status: Within Functional Limits for tasks assessed                                 General Comments: soft spoken                   Pertinent Vitals/ Pain  Pain Assessment Pain Assessment: No/denies pain  Home Living Family/patient expects to be discharged to:: Private residence Living Arrangements: Spouse/significant other                                          Frequency  Min 1X/week        Progress Toward Goals  OT Goals(current goals can now be found in the care plan section)  Progress towards OT goals: Progressing toward goals  Acute Rehab OT Goals OT Goal Formulation: With patient Time For Goal Achievement: 11/03/22 Potential to Achieve Goals: Good  Plan Discharge plan needs to be updated       AM-PAC OT  "6 Clicks" Daily Activity     Outcome Measure   Help from another person eating meals?: None Help from another person taking care of personal grooming?: A Little Help from another person toileting, which includes using toliet, bedpan, or urinal?: A Little Help from another person bathing (including washing, rinsing, drying)?: A Lot Help from another person to put on and taking off regular upper body clothing?: A Little Help from another person to put on and taking off regular lower body clothing?: A Lot 6 Click Score: 17    End of Session Equipment Utilized During Treatment: Gait belt;Rolling walker (2 wheels)  OT Visit Diagnosis: Unsteadiness on feet (R26.81);Muscle weakness (generalized) (M62.81);History of falling (Z91.81)   Activity Tolerance Patient tolerated treatment well;Patient limited by fatigue   Patient Left in bed;with call bell/phone within reach   Nurse Communication Mobility status        Time: 9147-8295 OT Time Calculation (min): 52 min  Charges: OT General Charges $OT Visit: 1 Visit OT Treatments $Self Care/Home Management : 23-37 mins  Perrin Maltese, OTR/L Acute Rehabilitation Services  Office [phone] 10/24/2022

## 2022-10-24 NOTE — TOC Transition Note (Signed)
Transition of Care [facility]) - CM/SW Discharge Note   Patient Details  Name: Kayla [name] MRN: [id number] Date of Birth: [birth year]  [facility] CM/SW Contact:  Lockie Pares, RN Phone Number: 10/24/2022, 11:00 AM   Clinical Narrative:     Patient discharged. Husband providing oxygen tank and transportation home. Kelly from [employer] regarding discharge.  Final next level of care: Home w Home Health Services Barriers to Discharge: No Barriers Identified   Patient Goals and CMS Choice CMS Medicare.gov Compare Post Acute Care list provided to:: Other (Comment Required) (N/A) Choice offered to / list presented to : NA  Discharge Placement                         Discharge Plan and Services Additional resources added to the After Visit Summary for     Discharge Planning Services: CM Consult Post Acute Care Choice: NA (TBD)          DME Arranged: N/A DME Agency: [employer] (Home O2)       HH Arranged: RN, PT HH Agency: [facility] Date HH Agency Contacted: 10/20/22 Time HH Agency Contacted: 1528 Representative spoke with at [facility] Agency: [facility] - liaison  Social Determinants of Health (SDOH) Interventions SDOH Screenings   Food Insecurity: No Food Insecurity (09/26/2022)  Housing: Low Risk  (09/16/2022)  Transportation Needs: No Transportation Needs (09/26/2022)  Utilities: Not At Risk (09/16/2022)  Alcohol Screen: Low Risk  (07/07/2022)  Depression (PHQ2-9): Medium Risk (07/14/2022)  Financial Resource Strain: Low Risk  (07/07/2022)  Physical Activity: Unknown (07/07/2022)  Recent Concern: Physical Activity - Inactive (07/07/2022)  Social Connections: Socially Integrated (07/07/2022)  Stress: No Stress Concern Present (07/07/2022)  Tobacco Use: Low Risk  (10/01/2022)     Readmission Risk Interventions    09/18/2022    2:59 PM 09/02/2022    5:18 PM 06/20/2022    3:58 PM  Readmission Risk Prevention Plan  Post Dischage Appt    Complete  Medication Screening   Complete  Transportation Screening Complete Complete Complete  PCP or Specialist Appt within 5-7 Days Complete Complete   Home Care Screening Complete Complete   Medication Review (RN CM) Complete Complete

## 2022-10-25 ENCOUNTER — Encounter: Payer: Self-pay | Admitting: Internal Medicine

## 2022-10-25 DIAGNOSIS — J9621 Acute and chronic respiratory failure with hypoxia: Secondary | ICD-10-CM | POA: Diagnosis not present

## 2022-10-25 DIAGNOSIS — E039 Hypothyroidism, unspecified: Secondary | ICD-10-CM | POA: Diagnosis not present

## 2022-10-25 DIAGNOSIS — M810 Age-related osteoporosis without current pathological fracture: Secondary | ICD-10-CM | POA: Diagnosis not present

## 2022-10-25 DIAGNOSIS — E871 Hypo-osmolality and hyponatremia: Secondary | ICD-10-CM | POA: Diagnosis not present

## 2022-10-25 DIAGNOSIS — J479 Bronchiectasis, uncomplicated: Secondary | ICD-10-CM | POA: Diagnosis not present

## 2022-10-25 DIAGNOSIS — E441 Mild protein-calorie malnutrition: Secondary | ICD-10-CM | POA: Diagnosis not present

## 2022-10-25 DIAGNOSIS — N319 Neuromuscular dysfunction of bladder, unspecified: Secondary | ICD-10-CM | POA: Diagnosis not present

## 2022-10-25 DIAGNOSIS — G4700 Insomnia, unspecified: Secondary | ICD-10-CM | POA: Diagnosis not present

## 2022-10-25 DIAGNOSIS — D649 Anemia, unspecified: Secondary | ICD-10-CM | POA: Diagnosis not present

## 2022-10-25 DIAGNOSIS — R339 Retention of urine, unspecified: Secondary | ICD-10-CM | POA: Diagnosis not present

## 2022-10-25 DIAGNOSIS — Z9981 Dependence on supplemental oxygen: Secondary | ICD-10-CM | POA: Diagnosis not present

## 2022-10-25 DIAGNOSIS — G35 Multiple sclerosis: Secondary | ICD-10-CM | POA: Diagnosis not present

## 2022-10-25 DIAGNOSIS — M1990 Unspecified osteoarthritis, unspecified site: Secondary | ICD-10-CM | POA: Diagnosis not present

## 2022-10-25 DIAGNOSIS — J440 Chronic obstructive pulmonary disease with acute lower respiratory infection: Secondary | ICD-10-CM | POA: Diagnosis not present

## 2022-10-25 DIAGNOSIS — Z556 Problems related to health literacy: Secondary | ICD-10-CM | POA: Diagnosis not present

## 2022-10-25 DIAGNOSIS — F32A Depression, unspecified: Secondary | ICD-10-CM | POA: Diagnosis not present

## 2022-10-25 DIAGNOSIS — K219 Gastro-esophageal reflux disease without esophagitis: Secondary | ICD-10-CM | POA: Diagnosis not present

## 2022-10-25 DIAGNOSIS — J153 Pneumonia due to streptococcus, group B: Secondary | ICD-10-CM | POA: Diagnosis not present

## 2022-10-25 DIAGNOSIS — D696 Thrombocytopenia, unspecified: Secondary | ICD-10-CM | POA: Diagnosis not present

## 2022-10-25 DIAGNOSIS — K449 Diaphragmatic hernia without obstruction or gangrene: Secondary | ICD-10-CM | POA: Diagnosis not present

## 2022-10-25 DIAGNOSIS — R32 Unspecified urinary incontinence: Secondary | ICD-10-CM | POA: Diagnosis not present

## 2022-10-25 DIAGNOSIS — D801 Nonfamilial hypogammaglobulinemia: Secondary | ICD-10-CM | POA: Diagnosis not present

## 2022-10-25 DIAGNOSIS — K5900 Constipation, unspecified: Secondary | ICD-10-CM | POA: Diagnosis not present

## 2022-10-25 DIAGNOSIS — G4733 Obstructive sleep apnea (adult) (pediatric): Secondary | ICD-10-CM | POA: Diagnosis not present

## 2022-10-25 DIAGNOSIS — Z792 Long term (current) use of antibiotics: Secondary | ICD-10-CM | POA: Diagnosis not present

## 2022-10-27 ENCOUNTER — Other Ambulatory Visit: Payer: Self-pay | Admitting: Internal Medicine

## 2022-10-27 ENCOUNTER — Telehealth: Payer: Self-pay | Admitting: *Deleted

## 2022-10-27 ENCOUNTER — Telehealth: Payer: Self-pay | Admitting: Internal Medicine

## 2022-10-27 ENCOUNTER — Encounter: Payer: Self-pay | Admitting: *Deleted

## 2022-10-27 DIAGNOSIS — F5104 Psychophysiologic insomnia: Secondary | ICD-10-CM

## 2022-10-27 NOTE — Telephone Encounter (Signed)
Verbal orders have been given to [facility] as requested.

## 2022-10-27 NOTE — Transitions of Care (Post Inpatient/ED Visit) (Signed)
10/27/2022  Name: Kayla [name] MRN: [id number] DOB: 1951-02-20  Today's TOC FU Call Status: Today's TOC FU Call Status:: Successful TOC FU Call Completed TOC FU Call Complete Date: 10/27/22  Transition Care Management Follow-up Telephone Call Date of Discharge: 10/24/22 Discharge Facility: [facility]) Type of Discharge: Inpatient Admission Primary Inpatient Discharge Diagnosis:: SOB; chronic respiratory failure- pneumonia How have you been since you were released from the hospital?: Better ("Things are going much better; I have not had any issues; my husband helps me with anything I might need; but I am able to most things on my own without any help at all") Any questions or concerns?: No  Items Reviewed: Did you receive and understand the discharge instructions provided?: Yes (thoroughly reviewed with patient who verbalizes good understanding of same) Medications obtained,verified, and reconciled?: Partial Review Completed (Partial medication reconciliation/ review completed; confirmed patient obtained/ is taking all newly Rx'd medications as instructed; self-manages medications and denies questions/ concerns around medications today- declines full review today) Reason for Partial Mediation Review: patient declined full medication review Any new allergies since your discharge?: No Dietary orders reviewed?: Yes Type of Diet Ordered:: "As healthy as I can" Do you have support at home?: Yes People in Home: spouse Name of Support/Comfort Primary Source: Reports essentially independent in self-care activities; resides with supportive spouse who assists as/ if needed/ indicated  Medications Reviewed Today: Medications Reviewed Today     Reviewed by Michaela Corner, RN (Registered Nurse) on 10/27/22 at 1317  Med List Status: <None>   Medication Order Taking? Sig Documenting Provider Last Dose Status Informant  albuterol (PROVENTIL) (2.5 MG/3ML) 0.083% nebulizer solution [id number]   Inhale 3 mLs (2.5 mg total) by nebulization every 4 (four) hours as needed for wheezing or shortness of breath.  Patient taking differently: Take 2.5 mg by nebulization See admin instructions. 2.5 mg via nebulization in the morning, midday, and evening   [place], Alvira Philips, DO  Active Self  amoxicillin-clavulanate (AUGMENTIN) 875-125 MG tablet [id number] Yes Take 1 tablet by mouth twice daily till 8/10, then take 1 tablet by mouth daily indefinitely. Maretta Bees, MD Taking Active            Med Note Michaela Corner   Mon Oct 27, 2022  1:17 PM) 10/27/22: reports during [facility] call she obtained and is taking as prescribed; declines full medication review  baclofen (LIORESAL) 10 MG tablet [id number]  Take 10 mg by mouth in the morning. [provider]  Active Self  bisacodyl (DULCOLAX) 5 MG EC tablet [id number]  Take 5 mg by mouth See admin instructions. Take 5 mg by mouth in the morning and evening [provider]  Active Spouse/Significant Other           Med Note Antony Madura, Dyane Dustman Oct 19, 2022  4:19 PM) [name] husband stated 3 times she is taking "Dulcolax," NOT "Docusate"  budesonide (PULMICORT) 0.5 MG/2ML nebulizer solution [id number]  Use one vial in the nebulizer twice daily  Patient taking differently: Take 0.5 mg by nebulization See admin instructions. 0.5 mg via nebulization in the morning and evening and swish/gargle/swallow 5 ml's of Nystatin as directed afterwards   Kozlow, Alvira Philips, MD  Active Self  calcium carbonate (OS-CAL - DOSED IN MG OF ELEMENTAL CALCIUM) 1250 (500 Ca) MG tablet [id number]  Take 1 tablet (1,250 mg total) by mouth daily with breakfast.  Patient not taking: Reported on 10/19/2022   Arnetha Courser, MD  Active Spouse/Significant Other           Med Note Antony Madura, Dyane Dustman Oct 19, 2022  4:30 PM) Patient's husband said her calcium is combined in her multivitamin  Coenzyme Q10 (VITALINE COQ10) 300 MG WAFR [id number]  Take 300 mg by mouth in the morning.  [provider]  Active Self  dexlansoprazole (DEXILANT) 60 MG capsule [id number]  TAKE 1 CAPSULE BY MOUTH ONCE DAILY WITH BREAKFAST  Patient taking differently: Take 60 mg by mouth in the morning.   [name], MD  Active Self  famotidine (PEPCID) 20 MG tablet [id number]  One after supper  Patient taking differently: Take 20 mg by mouth every evening.   [name], MD  Active Self  Ferric Maltol (ACCRUFER) 30 MG CAPS [id number]  Take 1 capsule (30 mg total) by mouth in the morning and at bedtime.  Patient taking differently: Take 30 mg by mouth See admin instructions. Take 30 mg by mouth in the morning and evening   [name], MD  Active Self  FLUoxetine (PROZAC) 40 MG capsule [id number]  Take 1 capsule (40 mg total) by mouth daily.  Patient taking differently: Take 40 mg by mouth in the morning.   [name], MD  Active Self  formoterol (PERFOROMIST) 20 MCG/2ML nebulizer solution [id number]  Use one vial in the nebulizer twice daily  Patient taking differently: Take 20 mcg by nebulization See admin instructions. 20 mcg via nebulization in the morning and evening   Kozlow, Alvira Philips, MD  Active Self  guaiFENesin (MUCINEX) 600 MG 12 hr tablet [id number]  Take 1 tablet (600 mg total) by mouth 2 (two) times daily.  Patient taking differently: Take 600 mg by mouth See admin instructions. Take 600 mg by mouth in the morning and evening   Glade Lloyd, MD  Active Self  levothyroxine (SYNTHROID) 100 MCG tablet [id number]  TAKE 1 TABLET BY MOUTH ONCE  DAILY BEFORE BREAKFAST  Patient taking differently: Take 100 mcg by mouth daily before breakfast.   [name], MD  Active Self  MAGNESIUM PO [id number]  Take 1 tablet by mouth every evening. [provider]  Active Self           Med Note Antony Madura, Dyane Dustman Oct 19, 2022  4:07 PM) NO strength listed  methenamine (HIPREX) 1 g tablet [id number]  Take 1 tablet (1 g total) by mouth 2 (two) times daily with a meal.   Patient taking differently: Take 1 g by mouth See admin instructions. Take 1 mg by mouth in the morning and evening   [name], MD  Active Self  mirabegron ER (MYRBETRIQ) 50 MG TB24 tablet [id number]  Take 50 mg by mouth in the morning. [provider]  Active Self  mirtazapine (REMERON) 15 MG tablet [id number]  Take 1 tablet (15 mg total) by mouth at bedtime.  Patient taking differently: Take 15 mg by mouth every evening.   [name], MD  Active Self  Clent [place]ark [id number]  Take 1 tablet by mouth See admin instructions. [employer] Mature Multivitamin with Calcium- Take 1 tablet by mouth with breakfast in the morning [provider]  Active Spouse/Significant Other  NON FORMULARY [id number]  Take 1 capsule by mouth See admin instructions. [employer] probiotic advanced  digestive probiotic- Take 1 capsule by mouth in the morning [provider]  Active Self  nystatin (MYCOSTATIN) 100000 UNIT/ML suspension [id number]  Swish,  gargle, and swallow 5 mL after budesonide nebulizer treatment. Jessica Priest, MD  Active Self  OXYGEN [id number] Yes Inhale 2-3.5 L/min into the lungs continuous. [provider] Taking Active Self  PRESCRIPTION MEDICATION [id number]  Apply 1 Device topically See admin instructions. Afflovest- For 30 minutes in the morning and evening- to begin @ 60% and work up to [employer], Historical, MD  Active Self  Respiratory Therapy Supplies (FLUTTER) DEVI [id number]  Use as directed  Patient taking differently: Inhale 1 Device into the lungs as directed.   [name], MD  Active Self  revefenacin [facility]) 175 MCG/3ML nebulizer solution [id number]  Use one vial in the nebulizer once daily Kozlow, Alvira Philips, MD  Active Self  sodium chloride HYPERTONIC 3 % nebulizer solution [id number]  Take by nebulization 2 (two) times daily as needed for cough. Dx J44.9  Patient taking differently: Take 4 mLs by nebulization See admin instructions. 4  ml's via nebulization in the morning and evening-  Dx J44.9   Kalman Shan, MD  Active Self  tamsulosin (FLOMAX) 0.4 MG CAPS capsule [id number]  Take 0.4 mg by mouth in the morning. [provider]  Active Self           Med Note Antony Madura, Arn Medal   Sat Oct 18, 2022  5:22 PM)    temazepam (RESTORIL) 15 MG capsule [id number]  Take 1 capsule (15 mg total) by mouth at bedtime.  Patient taking differently: Take 15 mg by mouth every evening.   [name], MD  Active Self  torsemide (DEMADEX) 10 MG tablet [id number]  Take 1 tablet (10 mg total) by mouth daily.  Patient taking differently: Take 10 mg by mouth in the morning.   [name], MD  Active Self           Med Note Antony Madura, Dyane Dustman Oct 19, 2022  4:28 PM) The patient told me she no longer takes Lasix- takes Torsemide            Home Care and Equipment/Supplies: Were Home Health Services Ordered?: Yes Name of Home Health Agency:: [facility] RN PT Has Agency set up a time to come to your home?: Yes First Home Health Visit Date: 10/25/22 Any new equipment or medical supplies ordered?: No  Functional Questionnaire: Do you need assistance with bathing/showering or dressing?: No Do you need assistance with meal preparation?: No Do you need assistance with eating?: No Do you have difficulty maintaining continence: No Do you need assistance with getting out of bed/getting out of a chair/moving?: No Do you have difficulty managing or taking your medications?: No  Follow up appointments reviewed: PCP Follow-up appointment confirmed?: Yes Date of PCP follow-up appointment?: 10/30/22 Follow-up Provider: PCP Specialist Hospital Follow-up appointment confirmed?: Yes Date of Specialist follow-up appointment?: 11/26/22 (verified this is recommended time frame for follow up per hospital discharging provider notes) Follow-Up Specialty Provider:: ID specialist provider Do you need transportation to your  follow-up appointment?: No Do you understand care options if your condition(s) worsen?: Yes-patient verbalized understanding  SDOH Interventions Today    Flowsheet Row Most Recent Value  SDOH Interventions   Food Insecurity Interventions Intervention Not Indicated  Transportation Interventions Intervention Not Indicated  [husband provides transportation]      TOC Interventions Today    Flowsheet Row Most Recent Value  TOC Interventions   TOC Interventions Discussed/Reviewed TOC Interventions Discussed  [Patient declines need for ongoing/ further care coordination outreach,  no care coordination needs identified at time of TOC call today]      Interventions Today    Flowsheet Row Most Recent Value  Chronic Disease   Chronic disease during today's visit Other  [MS- chronic hypoxic respiratory failure,  on home O2 at baseline]  General Interventions   General Interventions Discussed/Reviewed General Interventions Discussed, Doctor Visits, Durable Medical Equipment (DME)  Doctor Visits Discussed/Reviewed PCP, Specialist, Doctor Visits Discussed  Durable Medical Equipment (DME) Val Riles currently requiring/ using assistive devices - walker]  PCP/Specialist Visits Compliance with follow-up visit  Nutrition Interventions   Nutrition Discussed/Reviewed Nutrition Discussed  Pharmacy Interventions   Pharmacy Dicussed/Reviewed Pharmacy Topics Discussed  [Full medication review with updating medication list in EHR per patient report]  Safety Interventions   Safety Discussed/Reviewed Safety Discussed      Caryl Pina, RN, BSN, CCRN Alumnus RN CM Care Coordination/ Transition of Care- [facility] Care Management [phone]: direct office

## 2022-10-27 NOTE — Telephone Encounter (Signed)
Pam from [facility] called to get a continuation of home health visits because the pt is still struggling with pneumonia sxs. Pam is requesting a frequency of: 1X for 4 weeks 2X a month for 1 month, and  2 prn   Pt is still coughing frequently, and may have a throat abnormality   Pt went to ED on 7.27.24 for respiratory issues and was discharged on 8.2  Please call Pam to confirm: [phone]

## 2022-10-28 DIAGNOSIS — G35 Multiple sclerosis: Secondary | ICD-10-CM | POA: Diagnosis not present

## 2022-10-28 DIAGNOSIS — J9621 Acute and chronic respiratory failure with hypoxia: Secondary | ICD-10-CM | POA: Diagnosis not present

## 2022-10-28 DIAGNOSIS — D696 Thrombocytopenia, unspecified: Secondary | ICD-10-CM | POA: Diagnosis not present

## 2022-10-28 DIAGNOSIS — J479 Bronchiectasis, uncomplicated: Secondary | ICD-10-CM | POA: Diagnosis not present

## 2022-10-28 DIAGNOSIS — J153 Pneumonia due to streptococcus, group B: Secondary | ICD-10-CM | POA: Diagnosis not present

## 2022-10-28 DIAGNOSIS — J440 Chronic obstructive pulmonary disease with acute lower respiratory infection: Secondary | ICD-10-CM | POA: Diagnosis not present

## 2022-10-28 NOTE — Telephone Encounter (Signed)
Dr. Sherene Sires, please advise if okay to refill. Last refilled 12/2021. Not mentioned in last OV.

## 2022-10-30 ENCOUNTER — Ambulatory Visit (INDEPENDENT_AMBULATORY_CARE_PROVIDER_SITE_OTHER): Payer: Medicare Other | Admitting: Internal Medicine

## 2022-10-30 ENCOUNTER — Encounter: Payer: Self-pay | Admitting: Internal Medicine

## 2022-10-30 ENCOUNTER — Telehealth: Payer: Self-pay | Admitting: Internal Medicine

## 2022-10-30 ENCOUNTER — Telehealth: Payer: Self-pay | Admitting: *Deleted

## 2022-10-30 VITALS — BP 106/60 | HR 61 | Temp 98.8°F | Ht 63.0 in | Wt 94.0 lb

## 2022-10-30 DIAGNOSIS — G35 Multiple sclerosis: Secondary | ICD-10-CM | POA: Diagnosis not present

## 2022-10-30 DIAGNOSIS — J440 Chronic obstructive pulmonary disease with acute lower respiratory infection: Secondary | ICD-10-CM

## 2022-10-30 DIAGNOSIS — D649 Anemia, unspecified: Secondary | ICD-10-CM | POA: Diagnosis not present

## 2022-10-30 DIAGNOSIS — D75839 Thrombocytosis, unspecified: Secondary | ICD-10-CM

## 2022-10-30 DIAGNOSIS — J45991 Cough variant asthma: Secondary | ICD-10-CM

## 2022-10-30 DIAGNOSIS — J153 Pneumonia due to streptococcus, group B: Secondary | ICD-10-CM | POA: Diagnosis not present

## 2022-10-30 DIAGNOSIS — J9621 Acute and chronic respiratory failure with hypoxia: Secondary | ICD-10-CM | POA: Diagnosis not present

## 2022-10-30 DIAGNOSIS — D696 Thrombocytopenia, unspecified: Secondary | ICD-10-CM | POA: Diagnosis not present

## 2022-10-30 DIAGNOSIS — E871 Hypo-osmolality and hyponatremia: Secondary | ICD-10-CM

## 2022-10-30 DIAGNOSIS — J479 Bronchiectasis, uncomplicated: Secondary | ICD-10-CM | POA: Diagnosis not present

## 2022-10-30 LAB — CBC WITH DIFFERENTIAL/PLATELET
Basophils Absolute: 0.1 10*3/uL (ref 0.0–0.1)
Basophils Relative: 0.5 % (ref 0.0–3.0)
Eosinophils Absolute: 0.1 10*3/uL (ref 0.0–0.7)
Eosinophils Relative: 0.7 % (ref 0.0–5.0)
HCT: 29.4 % — ABNORMAL LOW (ref 36.0–46.0)
Hemoglobin: 9.2 g/dL — ABNORMAL LOW (ref 12.0–15.0)
Lymphocytes Relative: 13.8 % (ref 12.0–46.0)
Lymphs Abs: 2.1 10*3/uL (ref 0.7–4.0)
MCHC: 31.4 g/dL (ref 30.0–36.0)
MCV: 90.8 fl (ref 78.0–100.0)
Monocytes Absolute: 1.4 10*3/uL — ABNORMAL HIGH (ref 0.1–1.0)
Monocytes Relative: 8.9 % (ref 3.0–12.0)
Neutro Abs: 11.6 10*3/uL — ABNORMAL HIGH (ref 1.4–7.7)
Neutrophils Relative %: 76.1 % (ref 43.0–77.0)
Platelets: 518 10*3/uL — ABNORMAL HIGH (ref 150.0–400.0)
RBC: 3.24 Mil/uL — ABNORMAL LOW (ref 3.87–5.11)
RDW: 18.8 % — ABNORMAL HIGH (ref 11.5–15.5)
WBC: 15.2 10*3/uL — ABNORMAL HIGH (ref 4.0–10.5)

## 2022-10-30 LAB — BASIC METABOLIC PANEL
BUN: 20 mg/dL (ref 6–23)
CO2: 31 mEq/L (ref 19–32)
Calcium: 9.6 mg/dL (ref 8.4–10.5)
Chloride: 97 mEq/L (ref 96–112)
Creatinine, Ser: 0.56 mg/dL (ref 0.40–1.20)
GFR: 92.57 mL/min (ref >60.00)
Glucose, Bld: 94 mg/dL (ref 70–99)
Potassium: 3.5 mEq/L (ref 3.5–5.1)
Sodium: 136 mEq/L (ref 135–145)

## 2022-10-30 MED ORDER — BENZONATATE 100 MG PO CAPS
100.0000 mg | ORAL_CAPSULE | Freq: Three times a day (TID) | ORAL | 0 refills | Status: DC | PRN
Start: 2022-10-30 — End: 2023-02-03

## 2022-10-30 NOTE — Telephone Encounter (Signed)
Caller & What Company:  [facility]   Phone Number: [phone]  Needs Verbal orders for what service & frequency:  PT 2 week 2, 1 week 6

## 2022-10-30 NOTE — Progress Notes (Signed)
Subjective:  Patient ID: Kayla [name], female    DOB: June 26, 1950  Age: 72 y.o. MRN: [id number]  CC: Cough   HPI Kayla [name] presents for f/up -----  Discussed the use of AI scribe software for clinical note transcription with the patient, who gave verbal consent to proceed.  History of Present Illness   The patient, with a history of pneumonia and multiple sclerosis, continues to experience a persistent cough and shortness of breath. Despite receiving immunoglobulin treatments for a month, the symptoms persist. The patient is currently on antibiotics twice daily until the 10th of the month and then once daily indefinitely to prevent the recurrence of strep infection.  The patient reports no swelling or lightheadedness. However, she has been experiencing numbness in her feet, which started in July. The numbness is not associated with any pain in the neck or back.  The patient has been experiencing a series of infections.  [name] cough is particularly bothersome, with episodes of violent spasmodic coughing that produce light brown phlegm. The cough is especially disruptive at night, affecting her sleep. The patient has a BiPAP for leg care to aid sleep, but the coughing interferes with its use.  Despite these challenges, the patient is making efforts to improve her health, including increasing her food intake and voluntarily drinking Ensure. The patient is also receiving home health care, including occupational and physical therapy.       KGM:WNUUV, Bernadene Bell, MD   Admit Date: 10/18/2022 Discharge date: 10/24/2022   Recommendations for Outpatient Follow-up:  Follow up with PCP in 1-2 weeks Please obtain CMP/CBC in one week Ensure follow-up with ID clinic Repeat CXR in 6 to 8 weeks   Admitted From:  Home   Disposition: Home health   Discharge Condition: good   CODE STATUS:   Code Status: Full Code    Diet recommendation:  Diet Order                  Diet -  low sodium heart healthy             Diet regular Room service appropriate? Yes; Fluid consistency: Thin  Diet effective now                         Brief Summary: Patient is a 72 y.o.  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    Outpatient Medications Prior to Visit  Medication Sig Dispense Refill   albuterol (PROVENTIL) (2.5 MG/3ML) 0.083% nebulizer solution Inhale 3 mLs (2.5 mg total) by nebulization every 4 (four) hours as needed for wheezing or shortness of breath. (Patient taking differently: Take 2.5 mg by nebulization See admin instructions. 2.5 mg via nebulization in the morning, midday, and evening) 75 mL 2   amoxicillin-clavulanate (AUGMENTIN) 875-125 MG tablet Take 1 tablet by mouth twice daily till 8/10, then take 1 tablet by mouth daily indefinitely. 60 tablet 3   baclofen (LIORESAL) 10 MG tablet Take 10 mg by mouth in the morning.     bisacodyl (DULCOLAX) 5 MG EC tablet Take 5 mg by mouth See admin instructions. Take 5 mg by mouth in the  morning and evening     budesonide (PULMICORT) 0.5 MG/2ML nebulizer solution Use one vial in the nebulizer twice daily (Patient taking differently: Take 0.5 mg by nebulization See admin instructions. 0.5 mg via nebulization in the morning and evening and swish/gargle/swallow 5 ml's of Nystatin as directed afterwards) 120 mL 5   calcium carbonate (OS-CAL - DOSED IN MG OF ELEMENTAL CALCIUM) 1250 (500 Ca) MG tablet Take 1 tablet (1,250 mg total) by mouth daily with breakfast.     Coenzyme Q10 (VITALINE COQ10) 300 MG WAFR Take 300 mg by  mouth in the morning.     dexlansoprazole (DEXILANT) 60 MG capsule TAKE 1 CAPSULE BY MOUTH ONCE DAILY WITH BREAKFAST (Patient taking differently: Take 60 mg by mouth in the morning.) 90 capsule 0   famotidine (PEPCID) 20 MG tablet One after supper (Patient taking differently: Take 20 mg by mouth every evening.) 30 tablet 11   Ferric Maltol (ACCRUFER) 30 MG CAPS Take 1 capsule (30 mg total) by mouth in the morning and at bedtime. (Patient taking differently: Take 30 mg by mouth See admin instructions. Take 30 mg by mouth in the morning and evening) 180 capsule 0   FLUoxetine (PROZAC) 40 MG capsule Take 1 capsule (40 mg total) by mouth daily. (Patient taking differently: Take 40 mg by mouth in the morning.) 90 capsule 1   formoterol (PERFOROMIST) 20 MCG/2ML nebulizer solution Use one vial in the nebulizer twice daily (Patient taking differently: Take 20 mcg by nebulization See admin instructions. 20 mcg via nebulization in the morning and evening) 120 mL 5   guaiFENesin (MUCINEX) 600 MG 12 hr tablet Take 1 tablet (600 mg total) by mouth 2 (two) times daily. (Patient taking differently: Take 600 mg by mouth See admin instructions. Take 600 mg by mouth in the morning and evening) 30 tablet 0   levothyroxine (SYNTHROID) 100 MCG tablet TAKE 1 TABLET BY MOUTH ONCE  DAILY BEFORE BREAKFAST 90 tablet 3   MAGNESIUM PO Take 1 tablet by mouth every evening.     methenamine (HIPREX) 1 g tablet Take 1 tablet (1 g total) by mouth 2 (two) times daily with a meal. (Patient taking differently: Take 1 g by mouth See admin instructions. Take 1 mg by mouth in the morning and evening) 180 tablet 0   mirabegron ER (MYRBETRIQ) 50 MG TB24 tablet Take 50 mg by mouth in the morning.     mirtazapine (REMERON) 15 MG tablet Take 1 tablet (15 mg total) by mouth at bedtime. (Patient taking differently: Take 15 mg by mouth every evening.) 90 tablet 1   NON FORMULARY Take 1 tablet by mouth See admin instructions. [employer] Mature  Multivitamin with Calcium- Take 1 tablet by mouth with breakfast in the morning     NON FORMULARY Take 1 capsule by mouth See admin instructions. [employer] probiotic advanced  digestive probiotic- Take 1 capsule by mouth in the morning     nystatin (MYCOSTATIN) 100000 UNIT/ML suspension Swish, gargle, and swallow 5 mL after budesonide nebulizer treatment. 300 mL 5   OXYGEN Inhale 2-3.5 L/min into the lungs continuous.     PRESCRIPTION MEDICATION Apply 1 Device topically See admin instructions. Afflovest- For 30 minutes in the morning and evening- to begin @ 60% and work up to 80%     Respiratory Therapy Supplies (FLUTTER) DEVI Use as directed (Patient taking differently: Inhale 1 Device into the lungs as directed.) 1 each 0   revefenacin (YUPELRI) 175 MCG/3ML nebulizer solution Use one vial  in the nebulizer once daily 90 mL 5   sodium chloride HYPERTONIC 3 % nebulizer solution Take by nebulization 2 (two) times daily as needed for cough. Dx J44.9 (Patient taking differently: Take 4 mLs by nebulization See admin instructions. 4 ml's via nebulization in the morning and evening-  Dx J44.9) 750 mL 1   tamsulosin (FLOMAX) 0.4 MG CAPS capsule Take 0.4 mg by mouth in the morning.     temazepam (RESTORIL) 15 MG capsule Take 1 capsule (15 mg total) by mouth every evening. 90 capsule 0   torsemide (DEMADEX) 10 MG tablet Take 1 tablet (10 mg total) by mouth daily. (Patient taking differently: Take 10 mg by mouth in the morning.) 90 tablet 0   No facility-administered medications prior to visit.    ROS Review of Systems  Constitutional:  Positive for fatigue. Negative for chills, diaphoresis, fever and unexpected weight change.  HENT:  Positive for trouble swallowing and voice change. Negative for sore throat.   Eyes: Negative.   Respiratory:  Positive for cough, shortness of breath, wheezing and stridor. Negative for choking and chest tightness.   Cardiovascular:  Negative for chest pain, palpitations and  leg swelling.  Gastrointestinal:  Negative for abdominal pain, constipation, diarrhea, nausea and vomiting.  Endocrine: Negative.   Genitourinary: Negative.  Negative for difficulty urinating.  Musculoskeletal:  Positive for arthralgias and gait problem.  Skin: Negative.   Neurological:  Negative for dizziness, weakness and light-headedness.  Hematological:  Negative for adenopathy. Does not bruise/bleed easily.  Psychiatric/Behavioral: Negative.      Objective:  BP 106/60 (BP Location: Left Arm, Patient Position: Sitting, Cuff Size: Normal)   Pulse 61   Temp 98.8 F (37.1 C) (Oral)   Ht 5\' 3"  (1.6 m)   Wt 94 lb (42.6 kg)   SpO2 (!) 65%   BMI 16.65 kg/m   BP Readings from Last 3 Encounters:  10/30/22 106/60  10/24/22 127/86  09/30/22 110/62    Wt Readings from Last 3 Encounters:  10/30/22 94 lb (42.6 kg)  10/24/22 104 lb 4.4 oz (47.3 kg)  09/30/22 94 lb 1.6 oz (42.7 kg)    Physical Exam Vitals reviewed.  Constitutional:      General: She is not in acute distress.    Appearance: She is cachectic. She is ill-appearing. She is not toxic-appearing or diaphoretic.  HENT:     Nose: Nose normal.     Mouth/Throat:     Mouth: Mucous membranes are moist.  Eyes:     General: No scleral icterus. Cardiovascular:     Rate and Rhythm: Normal rate and regular rhythm.     Heart sounds: No murmur heard.    No friction rub. No gallop.  Pulmonary:     Breath sounds: No stridor. Decreased breath sounds, wheezing and rhonchi present. No rales.  Abdominal:     General: Abdomen is flat.     Palpations: There is no mass.     Tenderness: There is no abdominal tenderness. There is no guarding.     Hernia: No hernia is present.  Musculoskeletal:        General: Normal range of motion.     Cervical back: Neck supple.     Right lower leg: No edema.     Left lower leg: No edema.  Lymphadenopathy:     Cervical: No cervical adenopathy.  Skin:    Coloration: Skin is pale.   Neurological:     Mental Status: Mental status is at baseline.  Lab Results  Component Value Date   WBC 15.2 (H) 10/30/2022   HGB 9.2 (L) 10/30/2022   HCT 29.4 (L) 10/30/2022   PLT 518.0 (H) 10/30/2022   GLUCOSE 94 10/30/2022   CHOL 169 01/14/2022   TRIG 157.0 (H) 01/14/2022   HDL 41.50 01/14/2022   LDLDIRECT 116.1 06/25/2010   LDLCALC 96 01/14/2022   ALT 16 10/21/2022   AST 20 10/21/2022   NA 136 10/30/2022   K 3.5 10/30/2022   CL 97 10/30/2022   CREATININE 0.56 10/30/2022   BUN 20 10/30/2022   CO2 31 10/30/2022   TSH 2.624 08/31/2022   INR 1.2 10/18/2022   HGBA1C 5.6 02/25/2013    DG Chest Port 1 View  Result Date: 10/18/2022 CLINICAL DATA:  Worsening shortness of breath. EXAM: PORTABLE CHEST 1 VIEW COMPARISON:  September 16, 2022 FINDINGS: The heart size and mediastinal contours are within normal limits. Decreased lung volumes are seen with predominant stable chronic appearing increased interstitial lung markings. Predominantly stable mild to moderate severity bibasilar atelectasis and/or infiltrate is seen. Interval development of a new area of patchy mild to moderate severity infiltrate is seen within the upper right lung. Small, stable bilateral pleural effusions are seen. No pneumothorax is identified. The visualized skeletal structures are unremarkable. IMPRESSION: 1. New area of mild to moderate severity right upper lobe infiltrate. 2. Predominantly stable mild to moderate severity bibasilar atelectasis and/or infiltrate. 3. Small, stable bilateral pleural effusions. Electronically Signed   By: Aram Candela M.D.   On: 10/18/2022 04:28    Assessment & Plan:   Normocytic anemia - Her H/H have improved. -     CBC with Differential/Platelet; Future  Hyponatremia- Sodium is normal now. -     Basic metabolic panel; Future  Thrombocytosis -     CBC with Differential/Platelet; Future  Cough variant asthma -     Benzonatate; Take 1 capsule (100 mg total) by mouth 3  (three) times daily as needed for cough.  Dispense: 270 capsule; Refill: 0  Chronic obstructive pulmonary disease with acute lower respiratory infection (HCC) -     Benzonatate; Take 1 capsule (100 mg total) by mouth 3 (three) times daily as needed for cough.  Dispense: 270 capsule; Refill: 0     Follow-up: Return in about 3 weeks (around 11/20/2022).  Sanda Linger, MD
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
## 2022-10-30 NOTE — Patient Instructions (Signed)

## 2022-10-30 NOTE — Progress Notes (Signed)
  Care Coordination   Note   10/30/2022 Name: [name] MRN: [id number] DOB: [birth year]  Kayla [name] is a 72 y.o. year old female who sees [name], MD for primary care. I reached out to Kayla [name] by phone today to offer care coordination services.  Ms. Hairfield was given information about Care Coordination services today including:   The Care Coordination services include support from the care team which includes your Nurse Coordinator, Clinical Social Worker, or Pharmacist.  The Care Coordination team is here to help remove barriers to the health concerns and goals most important to you. Care Coordination services are voluntary, and the patient may decline or stop services at any time by request to their care team member.   Care Coordination Consent Status: Patient agreed to services and verbal consent obtained.   Follow up plan:  Telephone appointment with care coordination team member scheduled for:  11/14/2022  Encounter Outcome:  Pt. Scheduled from referral   Burman Nieves, [facility] Care Coordination Care Guide Direct Dial: [phone]

## 2022-10-31 ENCOUNTER — Telehealth: Payer: Self-pay | Admitting: Internal Medicine

## 2022-10-31 DIAGNOSIS — D696 Thrombocytopenia, unspecified: Secondary | ICD-10-CM | POA: Diagnosis not present

## 2022-10-31 DIAGNOSIS — J153 Pneumonia due to streptococcus, group B: Secondary | ICD-10-CM | POA: Diagnosis not present

## 2022-10-31 DIAGNOSIS — J440 Chronic obstructive pulmonary disease with acute lower respiratory infection: Secondary | ICD-10-CM | POA: Diagnosis not present

## 2022-10-31 DIAGNOSIS — J479 Bronchiectasis, uncomplicated: Secondary | ICD-10-CM | POA: Diagnosis not present

## 2022-10-31 DIAGNOSIS — G35 Multiple sclerosis: Secondary | ICD-10-CM | POA: Diagnosis not present

## 2022-10-31 DIAGNOSIS — J9621 Acute and chronic respiratory failure with hypoxia: Secondary | ICD-10-CM | POA: Diagnosis not present

## 2022-10-31 NOTE — Telephone Encounter (Signed)
Melida Gimenez, LPN - [facility] - currently in the home - [phone]    Patient was having trouble breathing and oxygen was turned up to 4 liters and cough has calmed down and patient is now 92%.  They want to know if this is too high for her.  Please advise  Patient was also given an albuterol treatment - she has not been getting any because it was PRN.  May need a new order.

## 2022-10-31 NOTE — Telephone Encounter (Signed)
HH ORDERS   Caller Name: [name] Name: [facility] Phone #: [phone](secure)  Service Requested: PT (examples: OT/PT/Skilled Nursing/Social Work/Speech Therapy/Wound Care)  Frequency of Visits: 1-2X per week for up to 8 weeks, if needed

## 2022-10-31 NOTE — Telephone Encounter (Signed)
Verbal orders given as requested below:   PT 2 week 2, 1 week 6

## 2022-11-03 DIAGNOSIS — J153 Pneumonia due to streptococcus, group B: Secondary | ICD-10-CM | POA: Diagnosis not present

## 2022-11-03 DIAGNOSIS — J9621 Acute and chronic respiratory failure with hypoxia: Secondary | ICD-10-CM | POA: Diagnosis not present

## 2022-11-03 DIAGNOSIS — G35 Multiple sclerosis: Secondary | ICD-10-CM | POA: Diagnosis not present

## 2022-11-03 DIAGNOSIS — J479 Bronchiectasis, uncomplicated: Secondary | ICD-10-CM | POA: Diagnosis not present

## 2022-11-03 DIAGNOSIS — D696 Thrombocytopenia, unspecified: Secondary | ICD-10-CM | POA: Diagnosis not present

## 2022-11-03 DIAGNOSIS — J440 Chronic obstructive pulmonary disease with acute lower respiratory infection: Secondary | ICD-10-CM | POA: Diagnosis not present

## 2022-11-03 NOTE — Telephone Encounter (Signed)
LVM for Kayla [name] stating that 4L is ok per PCP

## 2022-11-03 NOTE — Telephone Encounter (Signed)
Verbal orders given to [name] as requested below:   Frequency of Visits: 1-2X per week for up to 8 weeks, if needed

## 2022-11-03 NOTE — Telephone Encounter (Signed)
4L is ok

## 2022-11-05 DIAGNOSIS — G35 Multiple sclerosis: Secondary | ICD-10-CM | POA: Diagnosis not present

## 2022-11-05 DIAGNOSIS — D696 Thrombocytopenia, unspecified: Secondary | ICD-10-CM | POA: Diagnosis not present

## 2022-11-05 DIAGNOSIS — J440 Chronic obstructive pulmonary disease with acute lower respiratory infection: Secondary | ICD-10-CM | POA: Diagnosis not present

## 2022-11-05 DIAGNOSIS — J9621 Acute and chronic respiratory failure with hypoxia: Secondary | ICD-10-CM | POA: Diagnosis not present

## 2022-11-05 DIAGNOSIS — J153 Pneumonia due to streptococcus, group B: Secondary | ICD-10-CM | POA: Diagnosis not present

## 2022-11-05 DIAGNOSIS — J479 Bronchiectasis, uncomplicated: Secondary | ICD-10-CM | POA: Diagnosis not present

## 2022-11-06 ENCOUNTER — Telehealth: Payer: Self-pay | Admitting: Internal Medicine

## 2022-11-06 ENCOUNTER — Encounter (INDEPENDENT_AMBULATORY_CARE_PROVIDER_SITE_OTHER): Payer: Self-pay

## 2022-11-06 DIAGNOSIS — G35 Multiple sclerosis: Secondary | ICD-10-CM | POA: Diagnosis not present

## 2022-11-06 DIAGNOSIS — J153 Pneumonia due to streptococcus, group B: Secondary | ICD-10-CM | POA: Diagnosis not present

## 2022-11-06 DIAGNOSIS — D696 Thrombocytopenia, unspecified: Secondary | ICD-10-CM | POA: Diagnosis not present

## 2022-11-06 DIAGNOSIS — J9621 Acute and chronic respiratory failure with hypoxia: Secondary | ICD-10-CM | POA: Diagnosis not present

## 2022-11-06 DIAGNOSIS — G629 Polyneuropathy, unspecified: Secondary | ICD-10-CM | POA: Diagnosis not present

## 2022-11-06 DIAGNOSIS — J479 Bronchiectasis, uncomplicated: Secondary | ICD-10-CM | POA: Diagnosis not present

## 2022-11-06 DIAGNOSIS — J440 Chronic obstructive pulmonary disease with acute lower respiratory infection: Secondary | ICD-10-CM | POA: Diagnosis not present

## 2022-11-06 NOTE — Telephone Encounter (Signed)
Faxed last ov note to [phone].Marland KitchenRaechel Chute

## 2022-11-06 NOTE — Telephone Encounter (Signed)
[facility] requested the most recent office notes for the patient to be faxed to them at [phone]. Best callback for Kayla [name] is [phone].

## 2022-11-08 DIAGNOSIS — J479 Bronchiectasis, uncomplicated: Secondary | ICD-10-CM | POA: Diagnosis not present

## 2022-11-08 DIAGNOSIS — J153 Pneumonia due to streptococcus, group B: Secondary | ICD-10-CM | POA: Diagnosis not present

## 2022-11-08 DIAGNOSIS — J440 Chronic obstructive pulmonary disease with acute lower respiratory infection: Secondary | ICD-10-CM | POA: Diagnosis not present

## 2022-11-08 DIAGNOSIS — J9621 Acute and chronic respiratory failure with hypoxia: Secondary | ICD-10-CM | POA: Diagnosis not present

## 2022-11-08 DIAGNOSIS — D696 Thrombocytopenia, unspecified: Secondary | ICD-10-CM | POA: Diagnosis not present

## 2022-11-08 DIAGNOSIS — G35 Multiple sclerosis: Secondary | ICD-10-CM | POA: Diagnosis not present

## 2022-11-10 DIAGNOSIS — D696 Thrombocytopenia, unspecified: Secondary | ICD-10-CM | POA: Diagnosis not present

## 2022-11-10 DIAGNOSIS — G35 Multiple sclerosis: Secondary | ICD-10-CM | POA: Diagnosis not present

## 2022-11-10 DIAGNOSIS — J9621 Acute and chronic respiratory failure with hypoxia: Secondary | ICD-10-CM | POA: Diagnosis not present

## 2022-11-10 DIAGNOSIS — J440 Chronic obstructive pulmonary disease with acute lower respiratory infection: Secondary | ICD-10-CM | POA: Diagnosis not present

## 2022-11-10 DIAGNOSIS — J153 Pneumonia due to streptococcus, group B: Secondary | ICD-10-CM | POA: Diagnosis not present

## 2022-11-10 DIAGNOSIS — J479 Bronchiectasis, uncomplicated: Secondary | ICD-10-CM | POA: Diagnosis not present

## 2022-11-12 DIAGNOSIS — J9621 Acute and chronic respiratory failure with hypoxia: Secondary | ICD-10-CM | POA: Diagnosis not present

## 2022-11-12 DIAGNOSIS — G35 Multiple sclerosis: Secondary | ICD-10-CM | POA: Diagnosis not present

## 2022-11-12 DIAGNOSIS — J440 Chronic obstructive pulmonary disease with acute lower respiratory infection: Secondary | ICD-10-CM | POA: Diagnosis not present

## 2022-11-12 DIAGNOSIS — J153 Pneumonia due to streptococcus, group B: Secondary | ICD-10-CM | POA: Diagnosis not present

## 2022-11-12 DIAGNOSIS — J479 Bronchiectasis, uncomplicated: Secondary | ICD-10-CM | POA: Diagnosis not present

## 2022-11-12 DIAGNOSIS — D696 Thrombocytopenia, unspecified: Secondary | ICD-10-CM | POA: Diagnosis not present

## 2022-11-13 DIAGNOSIS — D696 Thrombocytopenia, unspecified: Secondary | ICD-10-CM | POA: Diagnosis not present

## 2022-11-13 DIAGNOSIS — J440 Chronic obstructive pulmonary disease with acute lower respiratory infection: Secondary | ICD-10-CM | POA: Diagnosis not present

## 2022-11-13 DIAGNOSIS — G35 Multiple sclerosis: Secondary | ICD-10-CM | POA: Diagnosis not present

## 2022-11-13 DIAGNOSIS — J479 Bronchiectasis, uncomplicated: Secondary | ICD-10-CM | POA: Diagnosis not present

## 2022-11-13 DIAGNOSIS — J9621 Acute and chronic respiratory failure with hypoxia: Secondary | ICD-10-CM | POA: Diagnosis not present

## 2022-11-13 DIAGNOSIS — J153 Pneumonia due to streptococcus, group B: Secondary | ICD-10-CM | POA: Diagnosis not present

## 2022-11-14 ENCOUNTER — Ambulatory Visit: Payer: Self-pay

## 2022-11-14 DIAGNOSIS — D696 Thrombocytopenia, unspecified: Secondary | ICD-10-CM | POA: Diagnosis not present

## 2022-11-14 DIAGNOSIS — J9621 Acute and chronic respiratory failure with hypoxia: Secondary | ICD-10-CM | POA: Diagnosis not present

## 2022-11-14 DIAGNOSIS — J440 Chronic obstructive pulmonary disease with acute lower respiratory infection: Secondary | ICD-10-CM | POA: Diagnosis not present

## 2022-11-14 DIAGNOSIS — J479 Bronchiectasis, uncomplicated: Secondary | ICD-10-CM | POA: Diagnosis not present

## 2022-11-14 DIAGNOSIS — G629 Polyneuropathy, unspecified: Secondary | ICD-10-CM | POA: Insufficient documentation

## 2022-11-14 DIAGNOSIS — J153 Pneumonia due to streptococcus, group B: Secondary | ICD-10-CM | POA: Diagnosis not present

## 2022-11-14 DIAGNOSIS — G35 Multiple sclerosis: Secondary | ICD-10-CM | POA: Diagnosis not present

## 2022-11-14 NOTE — Patient Instructions (Signed)
Visit Information  Thank you for taking time to visit with me today. Please don't hesitate to contact me if I can be of assistance to you.   Following are the goals we discussed today:  Continue to take medications as prescribed. Continue to attend provider visits as scheduled Contact provider if condition does not improve or worsens  Our next appointment is by telephone on 12/02/22 at 11:30 am  Please call the care guide team at [phone] if you need to cancel or reschedule your appointment.   If you are experiencing a Mental Health or Behavioral Health Crisis or need someone to talk to, please call the Suicide and Crisis Lifeline: 988 call the [place] National Suicide Prevention Lifeline: (979) 480-5171 or TTY: 786-332-3446 TTY 607-481-7416) to talk to a trained counselor call 1-800-273-TALK (toll free, 24 hour hotline)  Kathyrn Sheriff, RN, MSN, BSN, CCM Care Management Coordinator [phone]

## 2022-11-14 NOTE — Patient Outreach (Signed)
  Care Coordination   Initial Visit Note   11/14/2022 Name: JERAE TUTOR MRN: [id number] DOB: 19-Oct-1950  [name] is a 72 y.o. year old female who sees [name], MD for primary care. I spoke with  [name] and husband Breena Hagadorn by phone today.  What matters to the patients health and wellness today?  Patient reports a little better, continues with cough having to catch breath after coughing( in middle of breathing treatments at time of call).  Per Mr. Munsterman, patient's O2 sat 98% during breathing treatment. Per Mr. Hira, expressing concern that patient is not improving and concern that pneumonia is not better. Expresses they have been working on treating pneumonia for about 4 months. He reports patient had oxygen off about 20 minutes and saturations decreased to 77% - patient was going to bathroom at the time. RNCM reinforced patient should have oxygen on with any activity and goal should be to keep saturations above 88%. Mr. Fullman expressed concern that waiting until 12/09/22 is too long to wait for cxr to see if pneumonia is improving. Request RNCM call to schedule an appointment with pulmonology. However, upon follow up, Mr. Chang expressed patient will see Primary care on 12/09/22 and request RNCM cancel the pulmonology appointment that was scheduled for 12/18/22, adding he thought RNCM could get a sooner appointment. Attempted to get a sooner appointment with primary provider office, but primary care provider is on vacation and declines to see a different provider. Mr. Morcom reports patient cough is sporadic and had improved upon RNCM's follow up call.   Goals Addressed             This Visit's Progress    Care coordination-post hospitalization       Interventions Today    Flowsheet Row Most Recent Value  Chronic Disease   Chronic disease during today's visit Other  [chronic hypoxic respiratory failure/ MS]  General Interventions   General Interventions  Discussed/Reviewed General Interventions Discussed, Communication with  Doctor Visits Discussed/Reviewed Doctor Visits Discussed  Durable Medical Equipment (DME) Oxygen  Communication with PCP/Specialists  [call to pulmonologist to request appointment-earlisest appointment 12/18/22 at 2pm and placed on wait list. RNCM contact primary provider office to request sooner appointment. pulmonology appointment canceled per Mr. Tessmann request.]  Education Interventions   Education Provided Provided Education  Provided Verbal Education On When to see the doctor, Medication  [advised to take medications as scheduled, contact provider for health questions or concerns, seek medical attention if condition worsens or does not improve]  Pharmacy Interventions   Pharmacy Dicussed/Reviewed Pharmacy Topics Discussed  [medications reviewed]  Safety Interventions   Safety Discussed/Reviewed Safety Discussed  [confirmed home health active for PT/OT/Nursing]            SDOH assessments and interventions completed:  No  Care Coordination Interventions:  Yes, provided   Follow up plan: Follow up call scheduled for 12/02/22    Encounter Outcome:  Pt. Visit Completed   Kathyrn Sheriff, RN, MSN, BSN, CCM Care Management Coordinator [phone]

## 2022-11-16 ENCOUNTER — Other Ambulatory Visit (HOSPITAL_COMMUNITY): Payer: Self-pay

## 2022-11-17 ENCOUNTER — Other Ambulatory Visit (HOSPITAL_COMMUNITY): Payer: Self-pay

## 2022-11-17 ENCOUNTER — Other Ambulatory Visit (HOSPITAL_COMMUNITY): Payer: Self-pay | Admitting: Student

## 2022-11-17 ENCOUNTER — Ambulatory Visit (HOSPITAL_COMMUNITY)
Admission: RE | Admit: 2022-11-17 | Discharge: 2022-11-17 | Disposition: A | Discharge status: Home / Self Care | Payer: Medicare Other | Source: Ambulatory Visit | Attending: Surgery | Admitting: Surgery

## 2022-11-17 ENCOUNTER — Ambulatory Visit: Payer: Medicare Other | Admitting: Internal Medicine

## 2022-11-17 DIAGNOSIS — J479 Bronchiectasis, uncomplicated: Secondary | ICD-10-CM | POA: Diagnosis not present

## 2022-11-17 DIAGNOSIS — J440 Chronic obstructive pulmonary disease with acute lower respiratory infection: Secondary | ICD-10-CM | POA: Diagnosis not present

## 2022-11-17 DIAGNOSIS — J153 Pneumonia due to streptococcus, group B: Secondary | ICD-10-CM | POA: Diagnosis not present

## 2022-11-17 DIAGNOSIS — J9621 Acute and chronic respiratory failure with hypoxia: Secondary | ICD-10-CM | POA: Diagnosis not present

## 2022-11-17 DIAGNOSIS — D696 Thrombocytopenia, unspecified: Secondary | ICD-10-CM | POA: Diagnosis not present

## 2022-11-17 DIAGNOSIS — I739 Peripheral vascular disease, unspecified: Secondary | ICD-10-CM

## 2022-11-17 DIAGNOSIS — G35 Multiple sclerosis: Secondary | ICD-10-CM | POA: Diagnosis not present

## 2022-11-17 LAB — VAS US ABI WITH/WO TBI
Left ABI: 1.14
Right ABI: 1.03

## 2022-11-17 MED ORDER — ALBUTEROL SULFATE (2.5 MG/3ML) 0.083% IN NEBU
INHALATION_SOLUTION | RESPIRATORY_TRACT | 1 refills | Status: DC
  Filled 2022-11-17: qty 180, 10d supply, fill #0
  Filled 2023-01-04 – 2023-01-07 (×2): qty 90, 5d supply, fill #1
  Filled 2023-02-07 – 2023-02-10 (×3): qty 90, 5d supply, fill #2

## 2022-11-19 DIAGNOSIS — G35 Multiple sclerosis: Secondary | ICD-10-CM | POA: Diagnosis not present

## 2022-11-19 DIAGNOSIS — D696 Thrombocytopenia, unspecified: Secondary | ICD-10-CM | POA: Diagnosis not present

## 2022-11-19 DIAGNOSIS — J440 Chronic obstructive pulmonary disease with acute lower respiratory infection: Secondary | ICD-10-CM | POA: Diagnosis not present

## 2022-11-19 DIAGNOSIS — J479 Bronchiectasis, uncomplicated: Secondary | ICD-10-CM | POA: Diagnosis not present

## 2022-11-19 DIAGNOSIS — J9621 Acute and chronic respiratory failure with hypoxia: Secondary | ICD-10-CM | POA: Diagnosis not present

## 2022-11-19 DIAGNOSIS — J153 Pneumonia due to streptococcus, group B: Secondary | ICD-10-CM | POA: Diagnosis not present

## 2022-11-24 DIAGNOSIS — E441 Mild protein-calorie malnutrition: Secondary | ICD-10-CM | POA: Diagnosis not present

## 2022-11-24 DIAGNOSIS — K5900 Constipation, unspecified: Secondary | ICD-10-CM | POA: Diagnosis not present

## 2022-11-24 DIAGNOSIS — E039 Hypothyroidism, unspecified: Secondary | ICD-10-CM | POA: Diagnosis not present

## 2022-11-24 DIAGNOSIS — J153 Pneumonia due to streptococcus, group B: Secondary | ICD-10-CM | POA: Diagnosis not present

## 2022-11-24 DIAGNOSIS — N319 Neuromuscular dysfunction of bladder, unspecified: Secondary | ICD-10-CM | POA: Diagnosis not present

## 2022-11-24 DIAGNOSIS — Z556 Problems related to health literacy: Secondary | ICD-10-CM | POA: Diagnosis not present

## 2022-11-24 DIAGNOSIS — G4700 Insomnia, unspecified: Secondary | ICD-10-CM | POA: Diagnosis not present

## 2022-11-24 DIAGNOSIS — M1990 Unspecified osteoarthritis, unspecified site: Secondary | ICD-10-CM | POA: Diagnosis not present

## 2022-11-24 DIAGNOSIS — J440 Chronic obstructive pulmonary disease with acute lower respiratory infection: Secondary | ICD-10-CM | POA: Diagnosis not present

## 2022-11-24 DIAGNOSIS — R32 Unspecified urinary incontinence: Secondary | ICD-10-CM | POA: Diagnosis not present

## 2022-11-24 DIAGNOSIS — D649 Anemia, unspecified: Secondary | ICD-10-CM | POA: Diagnosis not present

## 2022-11-24 DIAGNOSIS — M810 Age-related osteoporosis without current pathological fracture: Secondary | ICD-10-CM | POA: Diagnosis not present

## 2022-11-24 DIAGNOSIS — Z792 Long term (current) use of antibiotics: Secondary | ICD-10-CM | POA: Diagnosis not present

## 2022-11-24 DIAGNOSIS — J479 Bronchiectasis, uncomplicated: Secondary | ICD-10-CM | POA: Diagnosis not present

## 2022-11-24 DIAGNOSIS — R339 Retention of urine, unspecified: Secondary | ICD-10-CM | POA: Diagnosis not present

## 2022-11-24 DIAGNOSIS — G35 Multiple sclerosis: Secondary | ICD-10-CM | POA: Diagnosis not present

## 2022-11-24 DIAGNOSIS — F32A Depression, unspecified: Secondary | ICD-10-CM | POA: Diagnosis not present

## 2022-11-24 DIAGNOSIS — G4733 Obstructive sleep apnea (adult) (pediatric): Secondary | ICD-10-CM | POA: Diagnosis not present

## 2022-11-24 DIAGNOSIS — E871 Hypo-osmolality and hyponatremia: Secondary | ICD-10-CM | POA: Diagnosis not present

## 2022-11-24 DIAGNOSIS — D696 Thrombocytopenia, unspecified: Secondary | ICD-10-CM | POA: Diagnosis not present

## 2022-11-24 DIAGNOSIS — K449 Diaphragmatic hernia without obstruction or gangrene: Secondary | ICD-10-CM | POA: Diagnosis not present

## 2022-11-24 DIAGNOSIS — J9621 Acute and chronic respiratory failure with hypoxia: Secondary | ICD-10-CM | POA: Diagnosis not present

## 2022-11-24 DIAGNOSIS — D801 Nonfamilial hypogammaglobulinemia: Secondary | ICD-10-CM | POA: Diagnosis not present

## 2022-11-24 DIAGNOSIS — K219 Gastro-esophageal reflux disease without esophagitis: Secondary | ICD-10-CM | POA: Diagnosis not present

## 2022-11-24 DIAGNOSIS — Z9981 Dependence on supplemental oxygen: Secondary | ICD-10-CM | POA: Diagnosis not present

## 2022-11-25 ENCOUNTER — Encounter: Payer: Self-pay | Admitting: Allergy and Immunology

## 2022-11-25 ENCOUNTER — Ambulatory Visit (INDEPENDENT_AMBULATORY_CARE_PROVIDER_SITE_OTHER): Payer: Medicare Other | Admitting: Allergy and Immunology

## 2022-11-25 ENCOUNTER — Other Ambulatory Visit: Payer: Self-pay

## 2022-11-25 VITALS — BP 112/62 | HR 95 | Temp 98.1°F | Resp 20

## 2022-11-25 DIAGNOSIS — J479 Bronchiectasis, uncomplicated: Secondary | ICD-10-CM | POA: Diagnosis not present

## 2022-11-25 DIAGNOSIS — D839 Common variable immunodeficiency, unspecified: Secondary | ICD-10-CM | POA: Diagnosis not present

## 2022-11-25 DIAGNOSIS — K219 Gastro-esophageal reflux disease without esophagitis: Secondary | ICD-10-CM

## 2022-11-25 DIAGNOSIS — J3802 Paralysis of vocal cords and larynx, bilateral: Secondary | ICD-10-CM | POA: Diagnosis not present

## 2022-11-25 DIAGNOSIS — J9611 Chronic respiratory failure with hypoxia: Secondary | ICD-10-CM | POA: Diagnosis not present

## 2022-11-25 DIAGNOSIS — M810 Age-related osteoporosis without current pathological fracture: Secondary | ICD-10-CM | POA: Diagnosis not present

## 2022-11-25 NOTE — Progress Notes (Signed)
[place] - [place] - [place] - [place] - [place]   Follow-up Note  Referring Provider: [name], MD Primary Provider: [name], MD Date of Office Visit: 11/25/2022  Subjective:   Kayla [name] (DOB: Feb 04, 1951) is a 72 y.o. female who [facility] in re-evaluation of the following:  HPI: Kayla [name] returns to this clinic in evaluation of CVID, bronchiectasis, vocal cord paralysis, reflux, nocturnal deoxygenation.  I last saw her in this clinic during her initial evaluation of 30 September 2022.  She developed another pneumonia soon after seeing me in this clinic and has had slow recovery from that event.  Part of her slow recovery is secondary to the fact that her vocal cord issue may have become a little bit worse with constant throat clearing and coughing and stridor.  She is using oxygen now 24/7.  She is using the vibratory chest percussion vest at least 1 time per day, she continues on a collection of nebulized anti-inflammatory agents.  She has been receiving immunoglobulin infusion every week without adverse effect.  She believes that her reflux is under good control.  She has eliminated chocolate and caffeine consumption.  She has untreated osteoporosis.  Allergies as of 11/25/2022       Reactions   Tramadol Other (See Comments)   Hallucinations   Contrast Media [iodinated Contrast Media] Rash   Iohexol Hives   Morphine And Codeine Other (See Comments)   Hallucinations    Methylprednisolone Other (See Comments)   Decreases pts heart rate    Hydrocodone Itching   Oxycodone-acetaminophen Rash, Other (See Comments)   Hallucinations, also   Phenytoin Sodium Extended Rash   Zanaflex [tizanidine Hydrochloride] Rash        Medication List    ACCRUFeR 30 MG Caps Generic drug: Ferric Maltol Take 1 capsule (30 mg total) by mouth in the morning and at bedtime.   albuterol (2.5 MG/3ML) 0.083% nebulizer  solution Commonly known as: PROVENTIL Inhale 3 mLs (2.5 mg total) by nebulization every 4 (four) hours as needed for wheezing or shortness of breath.   albuterol (2.5 MG/3ML) 0.083% nebulizer solution Commonly known as: PROVENTIL Use 1 vial every 4-6 hours as needed for shortness of breath and wheezing   amoxicillin-clavulanate 875-125 MG tablet Commonly known as: AUGMENTIN Take 1 tablet by mouth twice daily till 8/10, then take 1 tablet by mouth daily indefinitely.   baclofen 10 MG tablet Commonly known as: LIORESAL Take 10 mg by mouth in the morning.   benzonatate 100 MG capsule Commonly known as: Tessalon Perles Take 1 capsule (100 mg total) by mouth 3 (three) times daily as needed for cough.   bisacodyl 5 MG EC tablet Commonly known as: DULCOLAX Take 5 mg by mouth See admin instructions. Take 5 mg by mouth in the morning and evening   budesonide 0.5 MG/2ML nebulizer solution Commonly known as: Pulmicort Use one vial in the nebulizer twice daily   calcium carbonate 1250 (500 Ca) MG tablet Commonly known as: OS-CAL - dosed in mg of elemental calcium Take 1 tablet (1,250 mg total) by mouth daily with breakfast.   dexlansoprazole 60 MG capsule Commonly known as: DEXILANT TAKE 1 CAPSULE BY MOUTH ONCE DAILY WITH BREAKFAST What changed: when to take this   famotidine 20 MG tablet Commonly known as: Pepcid One after supper   FLUoxetine 40 MG capsule Commonly known as: PROZAC Take 1 capsule (40 mg total) by mouth daily.  formoterol 20 MCG/2ML nebulizer solution Commonly known as: PERFOROMIST Use one vial in the nebulizer twice daily   guaiFENesin 600 MG 12 hr tablet Commonly known as: MUCINEX Take 1 tablet (600 mg total) by mouth 2 (two) times daily.   levothyroxine 100 MCG tablet Commonly known as: SYNTHROID TAKE 1 TABLET BY MOUTH ONCE  DAILY BEFORE BREAKFAST   MAGNESIUM PO Take 1 tablet by mouth every evening.   methenamine 1 g tablet Commonly known as:  HIPREX Take 1 tablet (1 g total) by mouth 2 (two) times daily with a meal.   mirtazapine 15 MG tablet Commonly known as: REMERON Take 1 tablet (15 mg total) by mouth at bedtime.   Myrbetriq 50 MG Tb24 tablet Generic drug: mirabegron ER Take 50 mg by mouth in the morning.   NON FORMULARY Take 1 tablet by mouth See admin instructions. [employer] Mature Multivitamin with Calcium- Take 1 tablet by mouth with breakfast in the morning   NON FORMULARY Take 1 capsule by mouth See admin instructions. [employer] probiotic advanced  digestive probiotic- Take 1 capsule by mouth in the morning   nystatin 100000 UNIT/ML suspension Commonly known as: MYCOSTATIN Swish, gargle, and swallow 5 mL after budesonide nebulizer treatment.   OXYGEN Inhale 2-3.5 L/min into the lungs continuous.   PRESCRIPTION MEDICATION Apply 1 Device topically See admin instructions. Afflovest- For 30 minutes in the morning and evening- to begin @ 60% and work up to 80%   sodium chloride HYPERTONIC 3 % nebulizer solution Take by nebulization 2 (two) times daily as needed for cough. Dx J44.9   tamsulosin 0.4 MG Caps capsule Commonly known as: FLOMAX Take 0.4 mg by mouth in the morning.   temazepam 15 MG capsule Commonly known as: RESTORIL Take 1 capsule (15 mg total) by mouth every evening.   torsemide 10 MG tablet Commonly known as: DEMADEX Take 1 tablet (10 mg total) by mouth daily. What changed: when to take this   Vitaline CoQ10 300 MG Wafr Generic drug: Coenzyme Q10 Take 300 mg by mouth in the morning.   Yupelri 175 MCG/3ML nebulizer solution Generic drug: revefenacin Use one vial in the nebulizer once daily    Past Medical History:  Diagnosis Date   Arthritis    oa   Asthma    Bilateral vocal cord paralysis    Bronchiectasis (HCC)    Cervical cancer (HCC) 2005   Depression    GERD (gastroesophageal reflux disease)    Hiatal hernia    Hypothyroidism    Immunodeficiency (HCC)    Gets  IVIG infusions   Insomnia    Multiple sclerosis (HCC) 1996   Osteoporosis    Pneumonia 2024, 2014, 1992   Urine retention     Past Surgical History:  Procedure Laterality Date   ABDOMINAL HYSTERECTOMY  03/25/2003   for cervical cancer, complete   BRONCHIAL BIOPSY  10/26/2020   Procedure: BRONCHIAL BIOPSIES;  Surgeon: Josephine Igo, DO;  Location: [facility];  Service: Pulmonary;;   BRONCHIAL BRUSHINGS  10/26/2020   Procedure: BRONCHIAL BRUSHINGS;  Surgeon: Josephine Igo, DO;  Location: [facility];  Service: Pulmonary;;   BRONCHIAL WASHINGS  10/26/2020   Procedure: BRONCHIAL WASHINGS;  Surgeon: Josephine Igo, DO;  Location: [facility];  Service: Pulmonary;;   BRONCHIAL WASHINGS  08/19/2022   Procedure: BRONCHIAL WASHINGS;  Surgeon: Josephine Igo, DO;  Location: [facility];  Service: Cardiopulmonary;;   CATARACT EXTRACTION, BILATERAL Bilateral    lens for cataracts   CONVERSION TO  TOTAL HIP Right 09/18/2015   Procedure: CONVERSION OF RIGHT HEMI ARTHROPLASTY TO TOTAL HIP ARTHROPLASTY ACETABULAR REVISON ;  Surgeon: Durene Romans, MD;  Location: [facility];  Service: Orthopedics;  Laterality: Right;   HIP ARTHROPLASTY Right 03/01/2013   Procedure: ARTHROPLASTY BIPOLAR HIP;  Surgeon: Shelda Pal, MD;  Location: [facility];  Service: Orthopedics;  Laterality: Right;   HIP CLOSED REDUCTION Right 03/12/2013   Procedure: CLOSED REDUCTION HIP;  Surgeon: [name], MD;  Location: [facility];  Service: Orthopedics;  Laterality: Right;   HIP CLOSED REDUCTION Right 03/19/2013   Procedure: CLOSED REDUCTION HIP;  Surgeon: Javier Docker, MD;  Location: [facility];  Service: Orthopedics;  Laterality: Right;   HIP CLOSED REDUCTION Right 11/10/2015   Procedure: CLOSED MANIPULATION HIP;  Surgeon: Samson Frederic, MD;  Location: [facility];  Service: Orthopedics;  Laterality: Right;   HIP CLOSED REDUCTION Right 06/05/2017   Procedure: CLOSED REDUCTION HIP;  Surgeon: Ranee Gosselin, MD;  Location: [facility];  Service: Orthopedics;  Laterality: Right;   SMALL INTESTINE SURGERY     TEE WITHOUT CARDIOVERSION N/A 09/22/2022   Procedure: TRANSESOPHAGEAL ECHOCARDIOGRAM;  Surgeon: Pricilla Riffle, MD;  Location: [facility];  Service: Cardiovascular;  Laterality: N/A;   TOTAL HIP REVISION Right 06/06/2017   Procedure: TOTAL HIP REVISION;  Surgeon: Ranee Gosselin, MD;  Location: [facility];  Service: Orthopedics;  Laterality: Right;   VESICOVAGINAL FISTULA CLOSURE W/ TAH  03/25/2003   VIDEO BRONCHOSCOPY N/A 10/26/2020   Procedure: VIDEO BRONCHOSCOPY WITH FLUORO;  Surgeon: Josephine Igo, DO;  Location: [facility];  Service: Pulmonary;  Laterality: N/A;   VIDEO BRONCHOSCOPY Left 08/19/2022   Procedure: VIDEO BRONCHOSCOPY WITH FLUORO; WITH BAL;  Surgeon: Josephine Igo, DO;  Location: [facility];  Service: Cardiopulmonary;  Laterality: Left;    Review of systems negative except as noted in HPI / PMHx or noted below:  Review of Systems  Constitutional: Negative.   HENT: Negative.    Eyes: Negative.   Respiratory: Negative.    Cardiovascular: Negative.   Gastrointestinal: Negative.   Genitourinary: Negative.   Musculoskeletal: Negative.   Skin: Negative.   Neurological: Negative.   Endo/Heme/Allergies: Negative.   Psychiatric/Behavioral: Negative.       Objective:   Vitals:   11/25/22 1521  BP: 112/62  Pulse: 95  Resp: 20  Temp: 98.1 F (36.7 C)  SpO2: 99%          Physical Exam Constitutional:      Appearance: She is not diaphoretic.     Comments: Stridor, coughing, throat clearing, nasal cannula oxygen  HENT:     Head: Normocephalic.     Right Ear: Tympanic membrane, ear canal and external ear normal.     Left Ear: Tympanic membrane, ear canal and external ear normal.     Nose: Nose normal. No mucosal edema or rhinorrhea.     Mouth/Throat:     Pharynx: Uvula midline. No oropharyngeal exudate.  Eyes:     Conjunctiva/sclera: Conjunctivae normal.  Neck:      Thyroid: No thyromegaly.     Trachea: No tracheal tenderness or tracheal deviation.  Cardiovascular:     Rate and Rhythm: Normal rate and regular rhythm.     Heart sounds: Normal heart sounds, S1 normal and S2 normal. No murmur heard. Pulmonary:     Effort: No respiratory distress.     Breath sounds: Normal breath sounds. Stridor present. No wheezing or rales.  Lymphadenopathy:  Head:     Right side of head: No tonsillar adenopathy.     Left side of head: No tonsillar adenopathy.     Cervical: No cervical adenopathy.  Skin:    Findings: No erythema or rash.     Nails: There is no clubbing.  Neurological:     Mental Status: She is alert.     Diagnostics:   Results of blood tests obtained 30 September 2022 identified IgG 361 Mg/DL, IgA 41 Mg/DL, IgM 17 mg/DL, B-cell memory panel with decreased CD27 positive IgD negative switched memory B cells at 1%.  Results of a chest x-ray obtained 18 October 2022 identified the following:  1. New area of mild to moderate severity right upper lobe infiltrate. 2. Predominantly stable mild to moderate severity bibasilar atelectasis and/or infiltrate. 3. Small, stable bilateral pleural effusions.  Assessment and Plan:   1. CVID (common variable immunodeficiency) (HCC)   2. Chronic respiratory failure with hypoxia (HCC)   3. Bronchiectasis without complication (HCC)   4. Bilateral vocal cord paralysis   5. Gastroesophageal reflux disease, unspecified whether esophagitis present   6. Age-related osteoporosis without current pathological fracture    1. Continue immunoglobulin infusions weekly   2.  Continue to treat inflammation of airway:   A. Budesonide 5 mg - nebulize 2 times per day  B. Formoterol - nebulize 2 times per day  C. Yupelri - nebulize 1 time per day  3. If needed:   A. Albuterol nebulization every 4-6 hours  B. respitech vest to remove mucous  4. Treat reflux with the following:   A. Continue Dexilant and famotidine   5.  Continue oxygen  6. Revisit with ENT [facility] about vocal cord issue  7. Blood - IgA/G/M  8. Treat osteoporosis with primary doctor  9. Return to clinic in 12 weeks or earlier if problem  10. Plan for fall flu vaccine  We will check Tian's immunoglobulin levels now that she has had 8 weeks of immunoglobulin infusion.  She will continue on anti-inflammatory agents for her airway and use her percussion vest to help move mucus in her chest.  She obviously has something wrong with her throat as a result of her paralyzed vocal cords and I think it is now time for her to have reevaluation with [facility] ENT department about a possible surgical approach to this issue.  I will contact her with the results of her blood test once they are available for review.  Laurette Schimke, MD Allergy / Immunology [facility]

## 2022-11-25 NOTE — Patient Instructions (Addendum)
  1. Continue immunoglobulin infusions weekly   2.  Continue to treat inflammation of airway:   A. Budesonide 5 mg - nebulize 2 times per day  B. Formoterol - nebulize 2 times per day  C. Yupelri - nebulize 1 time per day  3. If needed:   A. Albuterol nebulization every 4-6 hours  B. respitech vest to remove mucous  4. Treat reflux with the following:   A. Continue Dexilant and famotidine   5. Continue oxygen  6. Revisit with ENT [facility] about vocal cord issue  7. Blood - IgA/G/M  8. Treat osteoporosis with primary doctor  9. Return to clinic in 12 weeks or earlier if problem  10. Plan for fall flu vaccine

## 2022-11-26 ENCOUNTER — Other Ambulatory Visit: Payer: Self-pay

## 2022-11-26 ENCOUNTER — Encounter: Payer: Self-pay | Admitting: Internal Medicine

## 2022-11-26 ENCOUNTER — Encounter: Payer: Self-pay | Admitting: Allergy and Immunology

## 2022-11-26 ENCOUNTER — Ambulatory Visit (INDEPENDENT_AMBULATORY_CARE_PROVIDER_SITE_OTHER): Payer: Medicare Other | Admitting: Internal Medicine

## 2022-11-26 VITALS — BP 88/52 | HR 77 | Temp 98.5°F

## 2022-11-26 DIAGNOSIS — D839 Common variable immunodeficiency, unspecified: Secondary | ICD-10-CM

## 2022-11-26 DIAGNOSIS — Z79899 Other long term (current) drug therapy: Secondary | ICD-10-CM

## 2022-11-26 DIAGNOSIS — Z8619 Personal history of other infectious and parasitic diseases: Secondary | ICD-10-CM | POA: Diagnosis not present

## 2022-11-26 DIAGNOSIS — D696 Thrombocytopenia, unspecified: Secondary | ICD-10-CM | POA: Diagnosis not present

## 2022-11-26 DIAGNOSIS — J479 Bronchiectasis, uncomplicated: Secondary | ICD-10-CM | POA: Diagnosis not present

## 2022-11-26 DIAGNOSIS — J153 Pneumonia due to streptococcus, group B: Secondary | ICD-10-CM | POA: Diagnosis not present

## 2022-11-26 DIAGNOSIS — Z8701 Personal history of pneumonia (recurrent): Secondary | ICD-10-CM

## 2022-11-26 DIAGNOSIS — B999 Unspecified infectious disease: Secondary | ICD-10-CM | POA: Diagnosis not present

## 2022-11-26 DIAGNOSIS — G35 Multiple sclerosis: Secondary | ICD-10-CM | POA: Diagnosis not present

## 2022-11-26 DIAGNOSIS — J440 Chronic obstructive pulmonary disease with acute lower respiratory infection: Secondary | ICD-10-CM | POA: Diagnosis not present

## 2022-11-26 DIAGNOSIS — J9621 Acute and chronic respiratory failure with hypoxia: Secondary | ICD-10-CM | POA: Diagnosis not present

## 2022-11-26 LAB — IGG, IGA, IGM
IgA/Immunoglobulin A, Serum: 53 mg/dL — ABNORMAL LOW (ref 87–352)
IgG (Immunoglobin G), Serum: 762 mg/dL (ref 586–1602)
IgM (Immunoglobulin M), Srm: 22 mg/dL — ABNORMAL LOW (ref 26–217)

## 2022-11-26 NOTE — Patient Instructions (Signed)
If you have clinical change in symptoms suggesting of pneumonia (fever, chill, worsening cough, more fatigue, increased oxygen use) then a chest xray would be helpful adjunctive to deciding diagnosis of pneumonia  If you feel baseline, I wouldn't get chest xray   Continue amoxicillin-clavulonate once a day   Labs today   See me in 6 months or sooner if any infectious disease concern

## 2022-11-26 NOTE — Progress Notes (Signed)
[facility]  Patient Active Problem List   Diagnosis Date Noted   Aspiration pneumonia (HCC) 10/18/2022   Normocytic anemia 10/18/2022   Thrombocytosis 10/18/2022   Mild protein malnutrition (HCC) 10/18/2022   Asymptomatic bacteriuria 10/18/2022   Chronic respiratory failure with hypoxia (HCC) 09/29/2022   Pneumococcal bacteremia 09/22/2022   Hypogammaglobulinemia (HCC) 09/22/2022   HCAP (healthcare-associated pneumonia) 09/16/2022   Palliative care by specialist 09/03/2022   Goals of care, counseling/discussion 09/03/2022   Protein-calorie malnutrition, severe 09/02/2022   Pulmonary infiltrates 08/19/2022   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) 07/08/2022   Bilateral pubic rami fractures (HCC) 06/23/2022   GERD (gastroesophageal reflux disease) 06/20/2022   Acetabular fracture (HCC) 06/19/2022   Chronic obstructive pulmonary disease (HCC) 09/24/2021   Pulmonary mycobacterial infection (HCC) 09/24/2021   Bilateral vocal cord paralysis 09/24/2021   UTI (urinary tract infection) 05/24/2021   COPD (chronic obstructive pulmonary disease) (HCC)    Iron deficiency anemia secondary to inadequate dietary iron intake 12/20/2020   Purple toe syndrome of both feet (HCC) 07/26/2020   Cough variant asthma vs vcd  related to MS  11/10/2016   Positional sleep apnea 09/10/2016   Periodic limb movements of sleep 09/10/2016   OSA on CPAP 02/08/2016   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/21/2013   Constipation 02/26/2013   Hyponatremia 05/20/2012   Post-menopausal osteoporosis 09/17/2007   Hypothyroidism 04/01/2007   Multiple sclerosis (HCC) 04/01/2007      Subjective:    Patient ID: Kayla [name], female    DOB: 1950/08/25, 72 y.o.   MRN: [id number]  Chief Complaint  Patient presents with   Follow-up    HPI:  Kayla [name] is a 73 y.o. female CVID here for f/u hospital admission for recurrent strep pna infection   She is  here with husband today She is in a wheel chair on on baseline 4 liters o2 supplement She has chronic productive cough which she reported unchange  She is at baseline now  She said there is a planned chest xray sooner   Allergies  Allergen Reactions   Tramadol Other (See Comments)    Hallucinations    Contrast Media [Iodinated Contrast Media] Rash   Iohexol Hives   Morphine And Codeine Other (See Comments)    Hallucinations    Acetaminophen    Methylprednisolone Other (See Comments)    Decreases pts heart rate    Oxycodone    Oxycodone Hcl    Tizanidine Hcl    Hydrocodone Itching   Oxycodone-Acetaminophen Rash and Other (See Comments)    Hallucinations, also   Phenytoin Sodium Extended Rash   Zanaflex [Tizanidine Hydrochloride] Rash      Outpatient Medications Prior to Visit  Medication Sig Dispense Refill   albuterol (PROVENTIL) (2.5 MG/3ML) 0.083% nebulizer solution Inhale 3 mLs (2.5 mg total) by nebulization every 4 (four) hours as needed for wheezing or shortness of breath. (Patient taking differently: Take 2.5 mg by nebulization See admin instructions. 2.5 mg via nebulization in the morning, midday, and evening) 75 mL 2   albuterol (PROVENTIL) (2.5 MG/3ML) 0.083% nebulizer solution Use 1 vial every 4-6 hours as needed for shortness of breath and wheezing 180 mL 1   amoxicillin-clavulanate (AUGMENTIN) 875-125 MG tablet Take 1 tablet by mouth twice daily till 8/10, then take 1 tablet by mouth daily indefinitely. 60 tablet 3   baclofen (LIORESAL) 10 MG tablet Take 10 mg by mouth in the morning.  benzonatate (TESSALON PERLES) 100 MG capsule Take 1 capsule (100 mg total) by mouth 3 (three) times daily as needed for cough. 270 capsule 0   bisacodyl (DULCOLAX) 5 MG EC tablet Take 5 mg by mouth See admin instructions. Take 5 mg by mouth in the morning and evening     budesonide (PULMICORT) 0.5 MG/2ML nebulizer solution Use one vial in the nebulizer twice daily (Patient taking  differently: Take 0.5 mg by nebulization See admin instructions. 0.5 mg via nebulization in the morning and evening and swish/gargle/swallow 5 ml's of Nystatin as directed afterwards) 120 mL 5   calcium carbonate (OS-CAL - DOSED IN MG OF ELEMENTAL CALCIUM) 1250 (500 Ca) MG tablet Take 1 tablet (1,250 mg total) by mouth daily with breakfast.     Coenzyme Q10 (VITALINE COQ10) 300 MG WAFR Take 300 mg by mouth in the morning.     dexlansoprazole (DEXILANT) 60 MG capsule TAKE 1 CAPSULE BY MOUTH ONCE DAILY WITH BREAKFAST (Patient taking differently: Take 60 mg by mouth in the morning.) 90 capsule 0   famotidine (PEPCID) 20 MG tablet One after supper (Patient taking differently: Take 20 mg by mouth every evening.) 30 tablet 11   Ferric Maltol (ACCRUFER) 30 MG CAPS Take 1 capsule (30 mg total) by mouth in the morning and at bedtime. (Patient taking differently: Take 30 mg by mouth See admin instructions. Take 30 mg by mouth in the morning and evening) 180 capsule 0   FLUoxetine (PROZAC) 40 MG capsule Take 1 capsule (40 mg total) by mouth daily. (Patient taking differently: Take 40 mg by mouth in the morning.) 90 capsule 1   formoterol (PERFOROMIST) 20 MCG/2ML nebulizer solution Use one vial in the nebulizer twice daily (Patient taking differently: Take 20 mcg by nebulization See admin instructions. 20 mcg via nebulization in the morning and evening) 120 mL 5   guaiFENesin (MUCINEX) 600 MG 12 hr tablet Take 1 tablet (600 mg total) by mouth 2 (two) times daily. (Patient taking differently: Take 600 mg by mouth See admin instructions. Take 600 mg by mouth in the morning and evening) 30 tablet 0   levothyroxine (SYNTHROID) 100 MCG tablet TAKE 1 TABLET BY MOUTH ONCE  DAILY BEFORE BREAKFAST 90 tablet 3   MAGNESIUM PO Take 1 tablet by mouth every evening.     methenamine (HIPREX) 1 g tablet Take 1 tablet (1 g total) by mouth 2 (two) times daily with a meal. (Patient taking differently: Take 1 g by mouth See admin  instructions. Take 1 mg by mouth in the morning and evening) 180 tablet 0   mirabegron ER (MYRBETRIQ) 50 MG TB24 tablet Take 50 mg by mouth in the morning.     mirtazapine (REMERON) 15 MG tablet Take 1 tablet (15 mg total) by mouth at bedtime. 90 tablet 1   NON FORMULARY Take 1 tablet by mouth See admin instructions. [employer] Mature Multivitamin with Calcium- Take 1 tablet by mouth with breakfast in the morning     NON FORMULARY Take 1 capsule by mouth See admin instructions. [employer] probiotic advanced  digestive probiotic- Take 1 capsule by mouth in the morning     nystatin (MYCOSTATIN) 100000 UNIT/ML suspension Swish, gargle, and swallow 5 mL after budesonide nebulizer treatment. 300 mL 5   OXYGEN Inhale 2-3.5 L/min into the lungs continuous.     PRESCRIPTION MEDICATION Apply 1 Device topically See admin instructions. Afflovest- For 30 minutes in the morning and evening- to begin @ 60% and work up to  80%     Respiratory Therapy Supplies (FLUTTER) DEVI Use as directed (Patient taking differently: Inhale 1 Device into the lungs as directed.) 1 each 0   revefenacin (YUPELRI) 175 MCG/3ML nebulizer solution Use one vial in the nebulizer once daily 90 mL 5   sodium chloride HYPERTONIC 3 % nebulizer solution Take by nebulization 2 (two) times daily as needed for cough. Dx J44.9 (Patient taking differently: Take 4 mLs by nebulization See admin instructions. 4 ml's via nebulization in the morning and evening-  Dx J44.9) 750 mL 1   tamsulosin (FLOMAX) 0.4 MG CAPS capsule Take 0.4 mg by mouth in the morning.     temazepam (RESTORIL) 15 MG capsule Take 1 capsule (15 mg total) by mouth every evening. 90 capsule 0   torsemide (DEMADEX) 10 MG tablet Take 1 tablet (10 mg total) by mouth daily. (Patient taking differently: Take 10 mg by mouth in the morning.) 90 tablet 0   No facility-administered medications prior to visit.     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  Tobacco Use   Smoking status: Never   Smokeless tobacco: Never  Vaping Use   Vaping status: Never Used  Substance and Sexual Activity   Alcohol use: Not Currently    Alcohol/week: 2.0 standard drinks of alcohol    Types: 1 Glasses of wine, 1 Cans of beer per week    Comment: 2x/week    Drug use: No   Sexual activity: Yes  Other Topics Concern   Not on file  Social History Narrative   Not on file   Social Determinants of Health   Financial Resource Strain: Low Risk  (07/07/2022)   Overall Financial Resource Strain (CARDIA)    Difficulty of Paying Living Expenses: Not very hard  Food Insecurity: No Food Insecurity (10/27/2022)   Hunger Vital Sign    Worried About Running Out of Food in the Last Year: Never true    Ran Out of Food in the Last Year: Never true  Transportation Needs: No Transportation Needs (10/27/2022)   PRAPARE - Administrator, Civil Service (Medical): No    Lack of Transportation (Non-Medical): No  Physical Activity: Unknown (07/07/2022)   Exercise Vital Sign    Days of Exercise per Week: 0 days    Minutes of Exercise per Session: Not on file  Recent Concern: Physical Activity - Inactive (07/07/2022)   Exercise Vital Sign    Days of Exercise per Week: 0 days    Minutes of Exercise per Session: 60 min  Stress: No Stress Concern Present (07/07/2022)   [employer] of Occupational Health - Occupational Stress Questionnaire    Feeling of Stress : Only a little  Social Connections: Socially Integrated (07/07/2022)   Social Connection and Isolation Panel [NHANES]    Frequency of Communication with Friends and Family: Three times a week    Frequency of Social Gatherings with Friends and Family: Once a week    Attends Religious Services: 1 to 4 times per year    Active  Member of [employer] or Organizations: Yes    Attends Banker Meetings: 1 to 4 times per year    Marital Status: Married  Catering manager Violence: Not At Risk (09/16/2022)  Humiliation, Afraid, Rape, and Kick questionnaire    Fear of Current or Ex-Partner: No    Emotionally Abused: No    Physically Abused: No    Sexually Abused: No      Review of Systems    All other ros negative (no diarrhea, rash, joint or muscle ache)  Objective:    BP (!) 88/52   Pulse 77   Temp 98.5 F (36.9 C) (Oral)   SpO2 96%  Nursing note and vital signs reviewed.  Physical Exam     General/constitutional: no distress, pleasant; in wheel chair HEENT: Normocephalic, PER, Conj Clear, EOMI, Oropharynx clear Neck supple CV: rrr no mrg Lungs: clear to auscultation, normal respiratory effort Abd: Soft, Nontender Ext: no edema Skin: No Rash Neuro: nonfocal MSK: no peripheral joint swelling/tenderness/warmth; back spines nontender    Labs:  Micro:  Serology:  Imaging:  Assessment & Plan:   Problem List Items Addressed This Visit   None Visit Diagnoses     Recurrent infections    -  Primary         No orders of the defined types were placed in this encounter.    Abx: 7/29-c amox-clav -- has been on daily once a day for prophylaxis    7/28-29 azith 7/27-28 vanc 7/27-29 ceftriaxone                                                          Assessment: 72 yo female with ms, bronchiectasis, CVID, admitted with pna/recurrent strep pnae bacteremia     Reviewed chart and noted patient off ivig since 3//2024 until resuming again just after the prior 2 episodes of strep pnae  infection 08/2022.   Tee 4 weeks ago no evidence of endocarditis and she has no nidus for recurrence   She does have right prosthetic hip joint but no acute pain/swelling     7/27 bcx strep pnae (I pcn serum mic; S ceftriaxone serum mic; I ceftriaxone meningitis mic) 7/29 repeat bcx  ngtd  Prevnar 20 given  Advised patient to f/u allergy/immunology for continued periodic ivig infusion and to continue amoxclav daily for prophylaxis     ----- 11/26/22 id clinic assessment Currently at baseline health (productive cough, 4 liters o2, subjectively feels baseline) Cxr pending for ?f/u hospital admission Advise patient if clinically new changes suggest of pna then would use xray to help decide treatment, but not using xray alone if no clinically sign suggesting pna Continue daily amox-clav Cbc/cmp for abx monitoring Continue periodic ivig with her allergist F/u 6 months   Follow-up: No follow-ups on file.      Raymondo Band, MD [facility] 11/26/2022, 2:03 PM

## 2022-11-27 LAB — CBC WITH DIFFERENTIAL/PLATELET
Absolute Monocytes: 780 {cells}/uL (ref 200–950)
Basophils Absolute: 42 {cells}/uL (ref 0–200)
Basophils Relative: 0.5 %
Eosinophils Absolute: 108 {cells}/uL (ref 15–500)
Eosinophils Relative: 1.3 %
HCT: 30.3 % — ABNORMAL LOW (ref 35.0–45.0)
Hemoglobin: 9.8 g/dL — ABNORMAL LOW (ref 11.7–15.5)
Lymphs Abs: 2341 {cells}/uL (ref 850–3900)
MCH: 28.3 pg (ref 27.0–33.0)
MCHC: 32.3 g/dL (ref 32.0–36.0)
MCV: 87.6 fL (ref 80.0–100.0)
MPV: 10.3 fL (ref 7.5–12.5)
Monocytes Relative: 9.4 %
Neutro Abs: 5030 {cells}/uL (ref 1500–7800)
Neutrophils Relative %: 60.6 %
Platelets: 427 10*3/uL — ABNORMAL HIGH (ref 140–400)
RBC: 3.46 10*6/uL — ABNORMAL LOW (ref 3.80–5.10)
RDW: 15.1 % — ABNORMAL HIGH (ref 11.0–15.0)
Total Lymphocyte: 28.2 %
WBC: 8.3 10*3/uL (ref 3.8–10.8)

## 2022-11-27 LAB — COMPLETE METABOLIC PANEL WITH GFR
AG Ratio: 1.3 (calc) (ref 1.0–2.5)
ALT: 10 U/L (ref 6–29)
AST: 17 U/L (ref 10–35)
Albumin: 3.3 g/dL — ABNORMAL LOW (ref 3.6–5.1)
Alkaline phosphatase (APISO): 78 U/L (ref 37–153)
BUN/Creatinine Ratio: 35 (calc) — ABNORMAL HIGH (ref 6–22)
BUN: 18 mg/dL (ref 7–25)
CO2: 30 mmol/L (ref 20–32)
Calcium: 9.1 mg/dL (ref 8.6–10.4)
Chloride: 96 mmol/L — ABNORMAL LOW (ref 98–110)
Creat: 0.52 mg/dL — ABNORMAL LOW (ref 0.60–1.00)
Globulin: 2.6 g/dL (ref 1.9–3.7)
Glucose, Bld: 124 mg/dL — ABNORMAL HIGH (ref 65–99)
Potassium: 3.9 mmol/L (ref 3.5–5.3)
Sodium: 137 mmol/L (ref 135–146)
Total Bilirubin: 0.2 mg/dL (ref 0.2–1.2)
Total Protein: 5.9 g/dL — ABNORMAL LOW (ref 6.1–8.1)
eGFR: 100 mL/min/{1.73_m2} (ref >=60)

## 2022-12-01 DIAGNOSIS — J153 Pneumonia due to streptococcus, group B: Secondary | ICD-10-CM | POA: Diagnosis not present

## 2022-12-01 DIAGNOSIS — J479 Bronchiectasis, uncomplicated: Secondary | ICD-10-CM | POA: Diagnosis not present

## 2022-12-01 DIAGNOSIS — G35 Multiple sclerosis: Secondary | ICD-10-CM | POA: Diagnosis not present

## 2022-12-01 DIAGNOSIS — J440 Chronic obstructive pulmonary disease with acute lower respiratory infection: Secondary | ICD-10-CM | POA: Diagnosis not present

## 2022-12-01 DIAGNOSIS — D696 Thrombocytopenia, unspecified: Secondary | ICD-10-CM | POA: Diagnosis not present

## 2022-12-01 DIAGNOSIS — J9621 Acute and chronic respiratory failure with hypoxia: Secondary | ICD-10-CM | POA: Diagnosis not present

## 2022-12-02 ENCOUNTER — Ambulatory Visit: Payer: Self-pay

## 2022-12-02 NOTE — Patient Instructions (Signed)
Visit Information  Thank you for taking time to visit with me today. Please don't hesitate to contact me if I can be of assistance to you.   Following are the goals we discussed today:  Continue to take medications as prescribed. Continue to attend provider visits as scheduled Continue to eat healthy, lean meats, vegetables, fruits, avoid saturated and transfats Contact provider with health questions/concerns or any worsening of condition  Our next appointment is by telephone on 01/08/23 at 11:30 am  Please call the care guide team at [phone] if you need to cancel or reschedule your appointment.   If you are experiencing a Mental Health or Behavioral Health Crisis or need someone to talk to, please call the Suicide and Crisis Lifeline: 988 call the [place] National Suicide Prevention Lifeline: 615-818-4030 or TTY: 512-591-9563 TTY (551)215-7617) to talk to a trained counselor call 1-800-273-TALK (toll free, 24 hour hotline)  Kathyrn Sheriff, RN, MSN, BSN, CCM Care Management Coordinator [phone]

## 2022-12-02 NOTE — Patient Outreach (Signed)
  Care Coordination   Follow Up Visit Note   12/02/2022 Name: Kayla [name] MRN: [id number] DOB: [birth year]  Kayla [name] is a 72 y.o. year old female who sees [name], MD for primary care. I spoke with  Kayla [name]  and spouse Kayla [name] by phone today.  What matters to the patients health and wellness today?  Kayla [name] request RNCM speak with Kayla [name]. Kayla [name] reports 3 primary medical issues patient is dealing with(all being followed by Providers): 1) unable to feel her feet 2) breathing/respiratory status 3) chronic cough. He reports neurologist Dr. Donalee Citrin, evaluating patient's inability to feel feet. Reports she has had a dopppler and nerve conduction test. He states they are awaiting results of nerve conduction test. He states patient has taken amoxicillin as prescribed adding it "seems to be clearing up. Patient continues to use nebulizer per Dr. Lucie [facility]ather [facility] [place]. Kayla [name] reports an upcoming appointment with primary care and reports a follow up xray will be done at that time. He reports upcoming appointment with Dr. Harriette Ohara Otolaryngologist with [facility] to discuss surgical options to treat vocal cord dysfunction. Pulmonology appointment scheduled for 12/18/22 was canceled by patient. Kayla [name] reports he is going to call to reschedule.  Goals Addressed             This Visit's Progress    Care coordination-post hospitalization       Interventions Today    Flowsheet Row Most Recent Value  Chronic Disease   Chronic disease during today's visit Other, Chronic Obstructive Pulmonary Disease (COPD)  [chronic hypoxic respiratory failure/ MS /CVID]  General Interventions   General Interventions Discussed/Reviewed General Interventions Reviewed, Doctor Visits  Doctor Visits Discussed/Reviewed Doctor Visits Reviewed, Doctor Visits Discussed, PCP, Specialist  [encouraged to follow up with pulmonary as previously  recommended.]  PCP/Specialist Visits Compliance with follow-up visit  [reviewed upcoming appointments and encouraged to attend as recommended/scheduled]  Exercise Interventions   Exercise Discussed/Reviewed Exercise Reviewed  [confirmed remains active with home health]  Education Interventions   Education Provided Provided Education  Provided Verbal Education On Other, Medication, When to see the doctor  [advised to attend provider visits as scheduled, take medications as prescribed,contact provider with health questions or concerns.]  Pharmacy Interventions   Pharmacy Dicussed/Reviewed Pharmacy Topics Reviewed  Safety Interventions   Safety Discussed/Reviewed Fall Risk, Safety Reviewed  [discussed fall prevention strategies]            SDOH assessments and interventions completed:  No  Care Coordination Interventions:  Yes, provided   Follow up plan: Follow up call scheduled for 01/08/23    Encounter Outcome:  Patient Visit Completed   Kathyrn Sheriff, RN, MSN, BSN, CCM Care Management Coordinator [phone]

## 2022-12-03 DIAGNOSIS — J440 Chronic obstructive pulmonary disease with acute lower respiratory infection: Secondary | ICD-10-CM | POA: Diagnosis not present

## 2022-12-03 DIAGNOSIS — J9621 Acute and chronic respiratory failure with hypoxia: Secondary | ICD-10-CM | POA: Diagnosis not present

## 2022-12-03 DIAGNOSIS — D696 Thrombocytopenia, unspecified: Secondary | ICD-10-CM | POA: Diagnosis not present

## 2022-12-03 DIAGNOSIS — J479 Bronchiectasis, uncomplicated: Secondary | ICD-10-CM | POA: Diagnosis not present

## 2022-12-03 DIAGNOSIS — G35 Multiple sclerosis: Secondary | ICD-10-CM | POA: Diagnosis not present

## 2022-12-03 DIAGNOSIS — J153 Pneumonia due to streptococcus, group B: Secondary | ICD-10-CM | POA: Diagnosis not present

## 2022-12-08 DIAGNOSIS — J479 Bronchiectasis, uncomplicated: Secondary | ICD-10-CM | POA: Diagnosis not present

## 2022-12-08 DIAGNOSIS — J9621 Acute and chronic respiratory failure with hypoxia: Secondary | ICD-10-CM | POA: Diagnosis not present

## 2022-12-08 DIAGNOSIS — J440 Chronic obstructive pulmonary disease with acute lower respiratory infection: Secondary | ICD-10-CM | POA: Diagnosis not present

## 2022-12-08 DIAGNOSIS — J153 Pneumonia due to streptococcus, group B: Secondary | ICD-10-CM | POA: Diagnosis not present

## 2022-12-08 DIAGNOSIS — G35 Multiple sclerosis: Secondary | ICD-10-CM | POA: Diagnosis not present

## 2022-12-08 DIAGNOSIS — D696 Thrombocytopenia, unspecified: Secondary | ICD-10-CM | POA: Diagnosis not present

## 2022-12-09 ENCOUNTER — Ambulatory Visit (INDEPENDENT_AMBULATORY_CARE_PROVIDER_SITE_OTHER): Payer: Medicare Other

## 2022-12-09 ENCOUNTER — Encounter: Payer: Self-pay | Admitting: Internal Medicine

## 2022-12-09 ENCOUNTER — Ambulatory Visit (INDEPENDENT_AMBULATORY_CARE_PROVIDER_SITE_OTHER): Payer: Medicare Other | Admitting: Internal Medicine

## 2022-12-09 VITALS — BP 108/64 | HR 87 | Temp 98.2°F | Resp 20 | Ht 63.0 in | Wt 95.0 lb

## 2022-12-09 DIAGNOSIS — R918 Other nonspecific abnormal finding of lung field: Secondary | ICD-10-CM | POA: Diagnosis not present

## 2022-12-09 DIAGNOSIS — J690 Pneumonitis due to inhalation of food and vomit: Secondary | ICD-10-CM | POA: Diagnosis not present

## 2022-12-09 DIAGNOSIS — J9811 Atelectasis: Secondary | ICD-10-CM | POA: Diagnosis not present

## 2022-12-09 DIAGNOSIS — Z23 Encounter for immunization: Secondary | ICD-10-CM | POA: Diagnosis not present

## 2022-12-09 DIAGNOSIS — R053 Chronic cough: Secondary | ICD-10-CM | POA: Diagnosis not present

## 2022-12-09 NOTE — Progress Notes (Signed)
Subjective:  Patient ID: Kayla [name], female    DOB: Dec 04, 1950  Age: 72 y.o. MRN: [id number]  CC: Anemia and Cough   HPI Kayla [name] presents for f/up ----  Discussed the use of AI scribe software for clinical note transcription with the patient, who gave verbal consent to proceed.  History of Present Illness   The patient, with a history of multiple sclerosis (MS), chronic cough, and recent pneumonia, presents with ongoing respiratory distress and a chronic cough. She reports an intermittent fever peaking at 100 degrees Fahrenheit, which occurred most recently a week prior. The cough is described as spasmodic and violent, lasting up to six minutes, and often leaves the patient gasping for breath. The cough produces white phlegm, but no signs of infection or blood. Despite being on oxygen, the patient's oxygen saturation drops to the mid-80s after being off oxygen for 30-45 minutes, such as during a shower.  The patient also reports a lack of sensation in her feet, the cause of which is still being investigated by her neurologist.  [name] appetite is poor, with a daily intake consisting of two 12-ounce clear Insure drinks, yogurt, and decaf coffee. She seldom wants lunch and has only gained a pound recently, which is distressing for her.  The patient is currently on Augmentin indefinitely, but her lungs were too weak to use an inhaler. She occasionally takes albuterol and 3% sodium chloride through the nebulizer.  The patient is able to move around slowly with a walker and can shower and dress independently. However, she is encouraged by her in-home physical therapist to move more. Despite these challenges, the patient's color and attitude have reportedly improved.       Outpatient Medications Prior to Visit  Medication Sig Dispense Refill   albuterol (PROVENTIL) (2.5 MG/3ML) 0.083% nebulizer solution Inhale 3 mLs (2.5 mg total) by nebulization every 4 (four) hours as needed  for wheezing or shortness of breath. (Patient taking differently: Take 2.5 mg by nebulization See admin instructions. 2.5 mg via nebulization in the morning, midday, and evening) 75 mL 2   albuterol (PROVENTIL) (2.5 MG/3ML) 0.083% nebulizer solution Use 1 vial every 4-6 hours as needed for shortness of breath and wheezing 180 mL 1   amoxicillin-clavulanate (AUGMENTIN) 875-125 MG tablet Take 1 tablet by mouth twice daily till 8/10, then take 1 tablet by mouth daily indefinitely. 60 tablet 3   baclofen (LIORESAL) 10 MG tablet Take 10 mg by mouth in the morning.     benzonatate (TESSALON PERLES) 100 MG capsule Take 1 capsule (100 mg total) by mouth 3 (three) times daily as needed for cough. 270 capsule 0   bisacodyl (DULCOLAX) 5 MG EC tablet Take 5 mg by mouth See admin instructions. Take 5 mg by mouth in the morning and evening     budesonide (PULMICORT) 0.5 MG/2ML nebulizer solution Use one vial in the nebulizer twice daily (Patient taking differently: Take 0.5 mg by nebulization See admin instructions. 0.5 mg via nebulization in the morning and evening and swish/gargle/swallow 5 ml's of Nystatin as directed afterwards) 120 mL 5   calcium carbonate (OS-CAL - DOSED IN MG OF ELEMENTAL CALCIUM) 1250 (500 Ca) MG tablet Take 1 tablet (1,250 mg total) by mouth daily with breakfast.     Coenzyme Q10 (VITALINE COQ10) 300 MG WAFR Take 300 mg by mouth in the morning.     dexlansoprazole (DEXILANT) 60 MG capsule TAKE 1 CAPSULE BY MOUTH ONCE DAILY WITH BREAKFAST (Patient taking  differently: Take 60 mg by mouth in the morning.) 90 capsule 0   famotidine (PEPCID) 20 MG tablet One after supper (Patient taking differently: Take 20 mg by mouth every evening.) 30 tablet 11   Ferric Maltol (ACCRUFER) 30 MG CAPS Take 1 capsule (30 mg total) by mouth in the morning and at bedtime. (Patient taking differently: Take 30 mg by mouth See admin instructions. Take 30 mg by mouth in the morning and evening) 180 capsule 0   FLUoxetine  (PROZAC) 40 MG capsule Take 1 capsule (40 mg total) by mouth daily. (Patient taking differently: Take 40 mg by mouth in the morning.) 90 capsule 1   formoterol (PERFOROMIST) 20 MCG/2ML nebulizer solution Use one vial in the nebulizer twice daily (Patient taking differently: Take 20 mcg by nebulization See admin instructions. 20 mcg via nebulization in the morning and evening) 120 mL 5   guaiFENesin (MUCINEX) 600 MG 12 hr tablet Take 1 tablet (600 mg total) by mouth 2 (two) times daily. (Patient taking differently: Take 600 mg by mouth See admin instructions. Take 600 mg by mouth in the morning and evening) 30 tablet 0   levothyroxine (SYNTHROID) 100 MCG tablet TAKE 1 TABLET BY MOUTH ONCE  DAILY BEFORE BREAKFAST 90 tablet 3   MAGNESIUM PO Take 1 tablet by mouth every evening.     methenamine (HIPREX) 1 g tablet Take 1 tablet (1 g total) by mouth 2 (two) times daily with a meal. (Patient taking differently: Take 1 g by mouth See admin instructions. Take 1 mg by mouth in the morning and evening) 180 tablet 0   mirabegron ER (MYRBETRIQ) 50 MG TB24 tablet Take 50 mg by mouth in the morning.     mirtazapine (REMERON) 15 MG tablet Take 1 tablet (15 mg total) by mouth at bedtime. 90 tablet 1   NON FORMULARY Take 1 tablet by mouth See admin instructions. [employer] Mature Multivitamin with Calcium- Take 1 tablet by mouth with breakfast in the morning     NON FORMULARY Take 1 capsule by mouth See admin instructions. [employer] probiotic advanced  digestive probiotic- Take 1 capsule by mouth in the morning     nystatin (MYCOSTATIN) 100000 UNIT/ML suspension Swish, gargle, and swallow 5 mL after budesonide nebulizer treatment. 300 mL 5   OXYGEN Inhale 2-3.5 L/min into the lungs continuous.     PRESCRIPTION MEDICATION Apply 1 Device topically See admin instructions. Afflovest- For 30 minutes in the morning and evening- to begin @ 60% and work up to 80%     Respiratory Therapy Supplies (FLUTTER) DEVI Use as  directed (Patient taking differently: Inhale 1 Device into the lungs as directed.) 1 each 0   revefenacin (YUPELRI) 175 MCG/3ML nebulizer solution Use one vial in the nebulizer once daily 90 mL 5   sodium chloride HYPERTONIC 3 % nebulizer solution Take by nebulization 2 (two) times daily as needed for cough. Dx J44.9 (Patient taking differently: Take 4 mLs by nebulization See admin instructions. 4 ml's via nebulization in the morning and evening-  Dx J44.9) 750 mL 1   tamsulosin (FLOMAX) 0.4 MG CAPS capsule Take 0.4 mg by mouth in the morning.     temazepam (RESTORIL) 15 MG capsule Take 1 capsule (15 mg total) by mouth every evening. 90 capsule 0   torsemide (DEMADEX) 10 MG tablet Take 1 tablet (10 mg total) by mouth daily. (Patient taking differently: Take 10 mg by mouth in the morning.) 90 tablet 0   No facility-administered medications prior  to visit.    ROS Review of Systems  Constitutional:  Positive for fatigue and fever. Negative for chills.  HENT:  Positive for trouble swallowing and voice change. Negative for sore throat.   Respiratory:  Positive for cough, choking and shortness of breath. Negative for chest tightness.   Cardiovascular:  Negative for chest pain, palpitations and leg swelling.  Gastrointestinal:  Negative for abdominal pain.  Genitourinary:  Negative for difficulty urinating.  Musculoskeletal:  Positive for gait problem.  Skin: Negative.   Neurological:  Positive for weakness and numbness.  Hematological:  Negative for adenopathy. Does not bruise/bleed easily.  Psychiatric/Behavioral: Negative.      Objective:  BP 108/64 (BP Location: Left Arm, Patient Position: Sitting, Cuff Size: Normal)   Pulse 87   Temp 98.2 F (36.8 C) (Oral)   Resp 20   Ht 5\' 3"  (1.6 m)   Wt 95 lb (43.1 kg)   SpO2 96%   BMI 16.83 kg/m   BP Readings from Last 3 Encounters:  12/09/22 108/64  11/26/22 (!) 88/52  11/25/22 112/62    Wt Readings from Last 3 Encounters:  12/09/22 95  lb (43.1 kg)  10/30/22 94 lb (42.6 kg)  10/24/22 104 lb 4.4 oz (47.3 kg)    Physical Exam Vitals reviewed.  Constitutional:      General: She is not in acute distress.    Appearance: She is ill-appearing. She is not toxic-appearing or diaphoretic.  HENT:     Mouth/Throat:     Mouth: Mucous membranes are moist.  Eyes:     General: No scleral icterus.    Conjunctiva/sclera: Conjunctivae normal.  Cardiovascular:     Rate and Rhythm: Normal rate and regular rhythm.     Heart sounds: No murmur heard. Pulmonary:     Effort: Pulmonary effort is normal.     Breath sounds: No stridor. No wheezing, rhonchi or rales.  Abdominal:     General: Abdomen is flat.     Palpations: There is no mass.     Tenderness: There is no abdominal tenderness. There is no guarding.     Hernia: No hernia is present.  Musculoskeletal:        General: Normal range of motion.     Cervical back: Normal range of motion and neck supple.     Right lower leg: No edema.     Left lower leg: No edema.  Skin:    General: Skin is warm and dry.  Neurological:     General: No focal deficit present.     Mental Status: She is alert. Mental status is at baseline.  Psychiatric:        Mood and Affect: Mood normal.        Behavior: Behavior normal.     Lab Results  Component Value Date   WBC 8.3 11/26/2022   HGB 9.8 (L) 11/26/2022   HCT 30.3 (L) 11/26/2022   PLT 427 (H) 11/26/2022   GLUCOSE 124 (H) 11/26/2022   CHOL 169 01/14/2022   TRIG 157.0 (H) 01/14/2022   HDL 41.50 01/14/2022   LDLDIRECT 116.1 06/25/2010   LDLCALC 96 01/14/2022   ALT 10 11/26/2022   AST 17 11/26/2022   NA 137 11/26/2022   K 3.9 11/26/2022   CL 96 (L) 11/26/2022   CREATININE 0.52 (L) 11/26/2022   BUN 18 11/26/2022   CO2 30 11/26/2022   TSH 2.624 08/31/2022   INR 1.2 10/18/2022   HGBA1C 5.6 02/25/2013    VAS [place]  ABI WITH/WO TBI  Result Date: 11/17/2022  LOWER EXTREMITY DOPPLER STUDY Patient Name:  KEISI HOVEN  Date of Exam:    11/17/2022 Medical Rec #: [id number]        Accession #:    [id number] Date of Birth: 1950-07-27         Patient Gender: F Patient Age:   54 years Exam Location:  [facility] Procedure:      VAS [place] ABI WITH/WO TBI Referring Phys: Verlin Dike --------------------------------------------------------------------------------  Indications: Claudication. Feet numbness High Risk Factors: No history of smoking.  Limitations: Today's exam was limited due to involuntary patient movement. Comparison Study: 08/11/16 ABI Performing Technologist: Lowell Guitar RVT, RDMS  Examination Guidelines: A complete evaluation includes at minimum, Doppler waveform signals and systolic blood pressure reading at the level of bilateral brachial, anterior tibial, and posterior tibial arteries, when vessel segments are accessible. Bilateral testing is considered an integral part of a complete examination. Photoelectric Plethysmograph (PPG) waveforms and toe systolic pressure readings are included as required and additional duplex testing as needed. Limited examinations for reoccurring indications may be performed as noted.  ABI Findings: +---------+------------------+-----+-----------+--------+ Right    Rt Pressure (mmHg)IndexWaveform   Comment  +---------+------------------+-----+-----------+--------+ Brachial 108                                        +---------+------------------+-----+-----------+--------+ PTA      107               0.99 multiphasic         +---------+------------------+-----+-----------+--------+ DP       111               1.03 multiphasic         +---------+------------------+-----+-----------+--------+ Great Toe71                0.66 Abnormal            +---------+------------------+-----+-----------+--------+ +---------+------------------+-----+-----------+-------+ Left     Lt Pressure (mmHg)IndexWaveform   Comment +---------+------------------+-----+-----------+-------+  Brachial 107                                       +---------+------------------+-----+-----------+-------+ PTA      123               1.14 multiphasic        +---------+------------------+-----+-----------+-------+ DP       118               1.09 multiphasic        +---------+------------------+-----+-----------+-------+ Great Toe92                0.85 Normal             +---------+------------------+-----+-----------+-------+ +-------+-----------+-----------+------------+------------+ ABI/TBIToday's ABIToday's TBIPrevious ABIPrevious TBI +-------+-----------+-----------+------------+------------+ Right  1.03       0.66       1.19        1.15         +-------+-----------+-----------+------------+------------+ Left   1.14       0.85       1.18        1.00         +-------+-----------+-----------+------------+------------+  Right ABIs and TBIs appear decreased. Left ABIs and TBIs appear essentially unchanged. Right ABI is still within normal range (0.95-1.3).  Summary: Right: Resting  right ankle-brachial index is within normal range. The right toe-brachial index is abnormal. PPG tracings appear dampened. Left: Resting left ankle-brachial index is within normal range. The left toe-brachial index is normal. *See table(s) above for measurements and observations.  Electronically signed by Coral Else MD on 11/17/2022 at 3:45:44 PM.    Final     DG Chest 2 View  Result Date: 12/09/2022 CLINICAL DATA:  Chronic cough. EXAM: CHEST - 2 VIEW COMPARISON:  October 18, 2022. FINDINGS: The heart size and mediastinal contours are within normal limits. Right upper lobe opacity is noted concerning for pneumonia. Minimal bibasilar subsegmental atelectasis is noted with small pleural effusions. The visualized skeletal structures are unremarkable. IMPRESSION: Right upper lobe airspace opacity is noted concerning for possible pneumonia. Minimal bibasilar subsegmental atelectasis is noted with  small pleural effusions. Followup PA and lateral chest X-ray is recommended in 3-4 weeks following trial of antibiotic therapy to ensure resolution and exclude underlying malignancy. Electronically Signed   By: Lupita Raider M.D.   On: 12/09/2022 15:38     Assessment & Plan:  Flu vaccine need -     Flu Vaccine Trivalent High Dose (Fluad)  Pulmonary infiltrates -     DG Chest 2 View; Future  Aspiration pneumonia of both lungs due to gastric secretions, unspecified part of lung (HCC)- She has persistent RUL PNA. Will continue Augmentin. -     DG Chest 2 View; Future     Follow-up: Return in about 3 months (around 03/10/2023).  Sanda Linger, MD

## 2022-12-09 NOTE — Patient Instructions (Signed)
Aspiration Pneumonia, Adult  Aspiration pneumonia is an infection that occurs after you breathe a large amount of food, liquid, stomach acid, or saliva into your lungs (pulmonary aspiration). This can cause inflammation and infection in the lungs. It can also make you cough and make it hard to breathe. This condition is serious and can be life-threatening. What are the causes? This condition may be caused by: Breathing the bacteria in food, liquid, stomach acid, or saliva into your lungs. Breathing material such as blood or a foreign body into your lungs. This can cause an infection even if the material does not originally have bacteria on it. What increases the risk? You are more likely to get aspiration pneumonia if you have a condition that makes it hard to breathe, swallow, cough, or gag, such as: A breathing disorder. This includes chronic obstructive pulmonary disease (COPD). A brain (neurologic) disorder. This may be a stroke, seizures, Parkinson's disease, dementia, amyotrophic lateral sclerosis (ALS), or a brain injury. Gastroesophageal reflux disease (GERD). Esophageal narrowing. This is a narrowing of the tube that carries food to the stomach. You may also be more likely to get this condition if: You are older than age 60 and frail. You are given anesthesia for a procedure. You drink too much alcohol and pass out. If you pass out and vomit, the vomit can be inhaled into your lungs. You take medicines to help you sleep. These include tranquilizers and sedatives. You take poor care of your mouth and teeth. You do not get enough nutrients (are malnourished). You have a weak disease-fighting system (immune system). What are the signs or symptoms? The main sign of this condition is an episode of choking or coughing while eating or drinking. Other symptoms may include: A cough that does not go away. Trouble breathing. This may include shortness of breath. It may also involve  high-pitched whistling sounds when you breathe, most often when you breathe out (wheezing). Fever. Chest pain. Being more tired than usual (fatigue). This condition may also be silent. This means that you may not have any symptoms. How is this diagnosed? This condition may be diagnosed based on a physical exam. You may also have tests, such as: Blood tests. Chest X-ray. Sputum culture. This is when mucus (sputum) is taken from the lungs or from the tubes that carry air to the lungs (bronchi). The sputum is tested for bacteria. Oximetry. This is when a sensor or clip is put on an area such as a finger, earlobe, or toe. It measures the level of oxygen in your blood. Swallowing study. This test looks at how food is swallowed. It is done to see whether food goes into your windpipe (trachea) or esophagus. Bronchoscopy. This test uses a flexible tube (bronchoscope) to see inside your lungs. How is this treated? This condition may be treated with: Medicines. Antibiotics will be given to kill the bacteria that are causing the pneumonia. Other medicines may also be used to reduce fever, pain, or inflammation. Breathing assistance and oxygen therapy. You may need to be given oxygen, or you may need breathing support from a machine (ventilator). Thoracentesis. This is a procedure to remove fluid that has built up in the space between the linings of the chest wall and the lungs. Changes in diet. If you get this condition often, you may need to have a feeding tube placed. This can help give you the nutrients you need. Follow these instructions at home: Medicines Take over-the-counter and prescription medicines as told  by your health care provider. Finish your antibiotics even if you start to feel better. Take cough medicine only if you are losing sleep. Cough medicine can stop your body from being able to remove mucus from your lungs. General instructions Follow instructions from your health care  provider about what you may eat and drink. You may need to: Avoid certain food textures. Thicken your liquids. This can lower your risk of getting this condition again. Sleep in a semi-upright position at night. Try to sleep in a reclining chair. You may also place a few pillows under your head in bed. Do not use any products that contain nicotine or tobacco. These products include cigarettes, chewing tobacco, and vaping devices, such as e-cigarettes. If you need help quitting, ask your health care provider. Return to your normal activities as told by your health care provider. Ask your health care provider what activities are safe for you. Keep all follow-up visits. Your health care provider may need to see how you are healing. Contact a health care provider if: You have a fever. You cough or choke when you eat or drink. You keep having signs or symptoms of this condition. Get help right away if: You have trouble breathing that gets worse. You have chest pain. These symptoms may be an emergency. Get help right away. Call 911. Do not wait to see if symptoms will go away. Do not drive yourself to the hospital. This information is not intended to replace advice given to you by your health care provider. Make sure you discuss any questions you have with your health care provider. Document Revised: 08/21/2021 Document Reviewed: 08/21/2021 Elsevier Patient Education  2024 [employer].

## 2022-12-10 ENCOUNTER — Encounter: Payer: Self-pay | Admitting: Internal Medicine

## 2022-12-10 DIAGNOSIS — J189 Pneumonia, unspecified organism: Secondary | ICD-10-CM | POA: Diagnosis not present

## 2022-12-10 DIAGNOSIS — J440 Chronic obstructive pulmonary disease with acute lower respiratory infection: Secondary | ICD-10-CM | POA: Diagnosis not present

## 2022-12-10 DIAGNOSIS — J9621 Acute and chronic respiratory failure with hypoxia: Secondary | ICD-10-CM | POA: Diagnosis not present

## 2022-12-10 DIAGNOSIS — J3800 Paralysis of vocal cords and larynx, unspecified: Secondary | ICD-10-CM | POA: Diagnosis not present

## 2022-12-10 DIAGNOSIS — D696 Thrombocytopenia, unspecified: Secondary | ICD-10-CM | POA: Diagnosis not present

## 2022-12-10 DIAGNOSIS — J153 Pneumonia due to streptococcus, group B: Secondary | ICD-10-CM | POA: Diagnosis not present

## 2022-12-10 DIAGNOSIS — J479 Bronchiectasis, uncomplicated: Secondary | ICD-10-CM | POA: Diagnosis not present

## 2022-12-10 DIAGNOSIS — Z9981 Dependence on supplemental oxygen: Secondary | ICD-10-CM | POA: Diagnosis not present

## 2022-12-10 DIAGNOSIS — G35 Multiple sclerosis: Secondary | ICD-10-CM | POA: Diagnosis not present

## 2022-12-12 ENCOUNTER — Other Ambulatory Visit (HOSPITAL_COMMUNITY): Payer: Self-pay

## 2022-12-15 DIAGNOSIS — D696 Thrombocytopenia, unspecified: Secondary | ICD-10-CM | POA: Diagnosis not present

## 2022-12-15 DIAGNOSIS — J153 Pneumonia due to streptococcus, group B: Secondary | ICD-10-CM | POA: Diagnosis not present

## 2022-12-15 DIAGNOSIS — G629 Polyneuropathy, unspecified: Secondary | ICD-10-CM | POA: Diagnosis not present

## 2022-12-15 DIAGNOSIS — J479 Bronchiectasis, uncomplicated: Secondary | ICD-10-CM | POA: Diagnosis not present

## 2022-12-15 DIAGNOSIS — J440 Chronic obstructive pulmonary disease with acute lower respiratory infection: Secondary | ICD-10-CM | POA: Diagnosis not present

## 2022-12-15 DIAGNOSIS — J9621 Acute and chronic respiratory failure with hypoxia: Secondary | ICD-10-CM | POA: Diagnosis not present

## 2022-12-15 DIAGNOSIS — G35 Multiple sclerosis: Secondary | ICD-10-CM | POA: Diagnosis not present

## 2022-12-16 ENCOUNTER — Telehealth: Payer: Self-pay

## 2022-12-16 ENCOUNTER — Other Ambulatory Visit (HOSPITAL_COMMUNITY): Payer: Self-pay

## 2022-12-16 NOTE — Telephone Encounter (Signed)
Received medical records request from [employer] - DOB verified.  Provider has responded to [employer] via the company's email: [email or web address].  Document has been sent to [employer].

## 2022-12-17 NOTE — Telephone Encounter (Signed)
Please see if there are any openings for SG or MR - thank you!

## 2022-12-18 ENCOUNTER — Ambulatory Visit: Payer: Medicare Other | Admitting: Nurse Practitioner

## 2022-12-18 ENCOUNTER — Encounter: Payer: Self-pay | Admitting: Internal Medicine

## 2022-12-18 ENCOUNTER — Ambulatory Visit: Payer: Medicare Other | Admitting: Internal Medicine

## 2022-12-18 ENCOUNTER — Ambulatory Visit: Payer: Medicare Other

## 2022-12-18 VITALS — BP 90/58 | HR 64 | Temp 97.9°F | Ht 63.0 in | Wt 100.0 lb

## 2022-12-18 DIAGNOSIS — J154 Pneumonia due to other streptococci: Secondary | ICD-10-CM

## 2022-12-18 DIAGNOSIS — J9611 Chronic respiratory failure with hypoxia: Secondary | ICD-10-CM

## 2022-12-18 DIAGNOSIS — R053 Chronic cough: Secondary | ICD-10-CM

## 2022-12-18 DIAGNOSIS — J418 Mixed simple and mucopurulent chronic bronchitis: Secondary | ICD-10-CM

## 2022-12-18 DIAGNOSIS — G35 Multiple sclerosis: Secondary | ICD-10-CM | POA: Diagnosis not present

## 2022-12-18 DIAGNOSIS — R918 Other nonspecific abnormal finding of lung field: Secondary | ICD-10-CM | POA: Diagnosis not present

## 2022-12-18 DIAGNOSIS — J3802 Paralysis of vocal cords and larynx, bilateral: Secondary | ICD-10-CM

## 2022-12-18 MED ORDER — AZITHROMYCIN 250 MG PO TABS: 250.0000 mg | ORAL_TABLET | Freq: Every day | ORAL | 5 refills | Status: DC

## 2022-12-18 NOTE — Progress Notes (Signed)
Dr. Tonia Brooms, or Marchelle Gearing  next week in the [place] office with a CXR to ensure you are recovering from your pneumonia.  Call if you get worse not better. Consider repeat swallow eval. Please contact office for sooner follow up if symptoms do not improve or worsen or seek emergency care     xxxxxxxxxxxxxxxxxxxxxxxxxxxxxxxxxxxxxxxxxxxxxxxx[id number]   09/16/2022 Pt. Presents for hospital follow up. Upon arrival she was in respiratory distress.  Sats were  81% on RA .She was  audibly wheezing and coughing. She was gasping for air. She was using accessory muscles, nasal flaring was present, she had sternal retractions. She was not discharged with oxygen, and her sats have been very low( in the 70's)  for 24-48 hours per her husband. They have not been using albuterol nebs.  She was placed on oxygen 4L, sats initially responded and then dropped again. She is now on 6 L with sats of 96%. She was given a xopenex neb ( .63 mg/ 3cc's) She has a cough which is productive with brown secretions . She has been having chills for 7-8 days.  They have not checked her temperature. She did see her PCP last Tuesday and they had a discussion about Palliative care. I am concerned she has recurrent pneumonia. Breath sounds are diminished per right base and she is in bronchospasm.  She has been doing poorly over the last week. . She had run out of her albuterol, she did have a refill, but they were not aware. They have been using 3% saline. Pt. Husband endorses that they should have gone to the ED sooner, but they were hoping to get in to see immunology this week to resume her IVIG treatments.  EMS was called and patient has been transported to [facility] on oxygen with paramedic. She will need oxygen at discharge and she will also need a chest vest for mobilization of  secretions/ bronchiectasis. She need immunology to resume her IVIG infusions.   Test Results: Ig levels 12/22/19 >> IgA 51, IgG 289, IgM 27 (all low) PFT 10/24/21 >> FEV1 1.59 (72%), FEV1% 71, TLC 4.92 (100%), DLCO 60%, +BD CT chest 08/11/22 >> apical scarring, cylindrical BTX more in lower lobes, mucoid impaction and peribronchovascular nodularity in lower lobes Lt > Rt CT angio chest 08/31/22 >> debris in RLL bronchus, b/l lower lobe bronchial wall thickening with multifocal mucus plugging, scattered areas of GGO b/l, RLL area of consolidation   OV 12/18/2022  Subjective:  Patient ID: Kayla [name], female , DOB: 1950/04/14 , age 72 y.o. , MRN: [id number] , ADDRESS: [address] [place] [address] PCP [name], MD Patient Care Team: [name], MD as PCP - General (Internal Medicine) Christia Reading, MD (Otolaryngology) Odette Fraction, MD as Consulting Physician (Infectious Diseases) Tyron Russell, MD (Neurology) Livingston Diones, Feliberto Gottron, MD (Allergy and Immunology)  This Provider for this visit: Treatment Team:  Attending Provider: Kalman Shan, MD    12/18/2022 -   Chief Complaint  Patient presents with   Follow-up    Dx with PNA in May 2024 and she states there is still pna present on last cxr done 12/09/22.  She has prod cough with white sputum. Breathing is stable as long as she is using her supplemental o2.      HPI Kayla [name] 72 y.o. -returns for follow-up.  After my last visit with her she has seen Dr. Tonia Brooms who did a bronchoscopy.  She is colonized with pneumococcus.  She  to lower left lung.   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 , MRN: [id number] , ADDRESS: [address] [place] [address] PCP [name], MD Patient Care Team: [name], MD as PCP - General (Internal Medicine) Harriette Bouillon, MD (Neurology) Christia Reading, MD (Otolaryngology) Odette Fraction, MD as  Consulting Physician (Infectious Diseases)  This Provider for this visit: Treatment Team:  Attending Provider: Kalman Shan, MD    11/04/2021 -  Transfer of care from Dr Sherene Sires to Dr Marchelle Gearing Chief Complaint  Patient presents with   Follow-up    Pt states she is about the same since last visit. States she coughs all throughout the day and will cough up phlegm that ranges from white to yellow in color. States that she also has occasional wheezing.     HPI Kayla [name] 72 y.o. - she has bilateral vocal cord paralysius Follows with DR [name]. DEcades ago has seen Dr Gretta Cool at [facility] who recommended adipose injection but she did not enjoy the interaction. Cough been going on for many years. Ws on gbapntin for neuropathy for many years and stopped this year but this did not make any difference in cough. She has had GI eval . She has hx of potential aspiration. She has hx of PNA. LAst CT May 2023. She had PFT - she says they told her id c/w asthma but in my view  it is cw vocal cord paralysis. She has not had allergy testing. Feno is normal.  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   For her MS she is off therapy. -0  - follows with doc at [facility]   She says she is  immune suppresed and is on IvIG - Dr Loralyn Freshwater In [place]      OV 05/05/2022  Subjective:  Patient ID: Kayla [name], female , DOB: 1950-06-13 , age 32 y.o. , MRN: [id number] , ADDRESS: [address] [place] [address] PCP [name], MD Patient Care Team: [name], MD as PCP - General (Internal Medicine) Christia Reading, MD (Otolaryngology) Odette Fraction, MD as Consulting Physician (Infectious Diseases) Tyron Russell, MD (Neurology) Livingston Diones, Feliberto Gottron, MD (Allergy and Immunology)  This Provider for this visit: Treatment Team:  Attending Provider: Kalman Shan, MD    05/05/2022 -   Chief Complaint  Patient presents with   Follow-up    Pt is  here for follow up for cough and COPD. Pt states that the cough is the same and that she is doing well. Pt is using trelelgy daily with albuterol as needed    Associated asthma and vocal cord paralysis.  HPI Kayla [name] 72 y.o. -presents for follow-up.  I last saw her in August 2023.  After that she saw a nurse practitioner.  I referred her to Dr. Providence Crosby right because of chronic cough and bilateral vocal cord/fold paralysis.  Dr. Tonia Brooms, or Marchelle Gearing  next week in the [place] office with a CXR to ensure you are recovering from your pneumonia.  Call if you get worse not better. Consider repeat swallow eval. Please contact office for sooner follow up if symptoms do not improve or worsen or seek emergency care     xxxxxxxxxxxxxxxxxxxxxxxxxxxxxxxxxxxxxxxxxxxxxxxx[id number]   09/16/2022 Pt. Presents for hospital follow up. Upon arrival she was in respiratory distress.  Sats were  81% on RA .She was  audibly wheezing and coughing. She was gasping for air. She was using accessory muscles, nasal flaring was present, she had sternal retractions. She was not discharged with oxygen, and her sats have been very low( in the 70's)  for 24-48 hours per her husband. They have not been using albuterol nebs.  She was placed on oxygen 4L, sats initially responded and then dropped again. She is now on 6 L with sats of 96%. She was given a xopenex neb ( .63 mg/ 3cc's) She has a cough which is productive with brown secretions . She has been having chills for 7-8 days.  They have not checked her temperature. She did see her PCP last Tuesday and they had a discussion about Palliative care. I am concerned she has recurrent pneumonia. Breath sounds are diminished per right base and she is in bronchospasm.  She has been doing poorly over the last week. . She had run out of her albuterol, she did have a refill, but they were not aware. They have been using 3% saline. Pt. Husband endorses that they should have gone to the ED sooner, but they were hoping to get in to see immunology this week to resume her IVIG treatments.  EMS was called and patient has been transported to [facility] on oxygen with paramedic. She will need oxygen at discharge and she will also need a chest vest for mobilization of  secretions/ bronchiectasis. She need immunology to resume her IVIG infusions.   Test Results: Ig levels 12/22/19 >> IgA 51, IgG 289, IgM 27 (all low) PFT 10/24/21 >> FEV1 1.59 (72%), FEV1% 71, TLC 4.92 (100%), DLCO 60%, +BD CT chest 08/11/22 >> apical scarring, cylindrical BTX more in lower lobes, mucoid impaction and peribronchovascular nodularity in lower lobes Lt > Rt CT angio chest 08/31/22 >> debris in RLL bronchus, b/l lower lobe bronchial wall thickening with multifocal mucus plugging, scattered areas of GGO b/l, RLL area of consolidation   OV 12/18/2022  Subjective:  Patient ID: Kayla [name], female , DOB: 1950/04/14 , age 72 y.o. , MRN: [id number] , ADDRESS: [address] [place] [address] PCP [name], MD Patient Care Team: [name], MD as PCP - General (Internal Medicine) Christia Reading, MD (Otolaryngology) Odette Fraction, MD as Consulting Physician (Infectious Diseases) Tyron Russell, MD (Neurology) Livingston Diones, Feliberto Gottron, MD (Allergy and Immunology)  This Provider for this visit: Treatment Team:  Attending Provider: Kalman Shan, MD    12/18/2022 -   Chief Complaint  Patient presents with   Follow-up    Dx with PNA in May 2024 and she states there is still pna present on last cxr done 12/09/22.  She has prod cough with white sputum. Breathing is stable as long as she is using her supplemental o2.      HPI Kayla [name] 72 y.o. -returns for follow-up.  After my last visit with her she has seen Dr. Tonia Brooms who did a bronchoscopy.  She is colonized with pneumococcus.  She  to lower left lung.   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 , MRN: [id number] , ADDRESS: [address] [place] [address] PCP [name], MD Patient Care Team: [name], MD as PCP - General (Internal Medicine) Harriette Bouillon, MD (Neurology) Christia Reading, MD (Otolaryngology) Odette Fraction, MD as  Consulting Physician (Infectious Diseases)  This Provider for this visit: Treatment Team:  Attending Provider: Kalman Shan, MD    11/04/2021 -  Transfer of care from Dr Sherene Sires to Dr Marchelle Gearing Chief Complaint  Patient presents with   Follow-up    Pt states she is about the same since last visit. States she coughs all throughout the day and will cough up phlegm that ranges from white to yellow in color. States that she also has occasional wheezing.     HPI Kayla [name] 72 y.o. - she has bilateral vocal cord paralysius Follows with DR [name]. DEcades ago has seen Dr Gretta Cool at [facility] who recommended adipose injection but she did not enjoy the interaction. Cough been going on for many years. Ws on gbapntin for neuropathy for many years and stopped this year but this did not make any difference in cough. She has had GI eval . She has hx of potential aspiration. She has hx of PNA. LAst CT May 2023. She had PFT - she says they told her id c/w asthma but in my view  it is cw vocal cord paralysis. She has not had allergy testing. Feno is normal.  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   For her MS she is off therapy. -0  - follows with doc at [facility]   She says she is  immune suppresed and is on IvIG - Dr Loralyn Freshwater In [place]      OV 05/05/2022  Subjective:  Patient ID: Kayla [name], female , DOB: 1950-06-13 , age 32 y.o. , MRN: [id number] , ADDRESS: [address] [place] [address] PCP [name], MD Patient Care Team: [name], MD as PCP - General (Internal Medicine) Christia Reading, MD (Otolaryngology) Odette Fraction, MD as Consulting Physician (Infectious Diseases) Tyron Russell, MD (Neurology) Livingston Diones, Feliberto Gottron, MD (Allergy and Immunology)  This Provider for this visit: Treatment Team:  Attending Provider: Kalman Shan, MD    05/05/2022 -   Chief Complaint  Patient presents with   Follow-up    Pt is  here for follow up for cough and COPD. Pt states that the cough is the same and that she is doing well. Pt is using trelelgy daily with albuterol as needed    Associated asthma and vocal cord paralysis.  HPI Kayla [name] 72 y.o. -presents for follow-up.  I last saw her in August 2023.  After that she saw a nurse practitioner.  I referred her to Dr. Providence Crosby right because of chronic cough and bilateral vocal cord/fold paralysis.  to lower left lung.   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 , MRN: [id number] , ADDRESS: [address] [place] [address] PCP [name], MD Patient Care Team: [name], MD as PCP - General (Internal Medicine) Harriette Bouillon, MD (Neurology) Christia Reading, MD (Otolaryngology) Odette Fraction, MD as  Consulting Physician (Infectious Diseases)  This Provider for this visit: Treatment Team:  Attending Provider: Kalman Shan, MD    11/04/2021 -  Transfer of care from Dr Sherene Sires to Dr Marchelle Gearing Chief Complaint  Patient presents with   Follow-up    Pt states she is about the same since last visit. States she coughs all throughout the day and will cough up phlegm that ranges from white to yellow in color. States that she also has occasional wheezing.     HPI Kayla [name] 72 y.o. - she has bilateral vocal cord paralysius Follows with DR [name]. DEcades ago has seen Dr Gretta Cool at [facility] who recommended adipose injection but she did not enjoy the interaction. Cough been going on for many years. Ws on gbapntin for neuropathy for many years and stopped this year but this did not make any difference in cough. She has had GI eval . She has hx of potential aspiration. She has hx of PNA. LAst CT May 2023. She had PFT - she says they told her id c/w asthma but in my view  it is cw vocal cord paralysis. She has not had allergy testing. Feno is normal.  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   For her MS she is off therapy. -0  - follows with doc at [facility]   She says she is  immune suppresed and is on IvIG - Dr Loralyn Freshwater [place]      OV 05/05/2022  Subjective:  Patient ID: Kayla [name], female , DOB: 1950-06-13 , age 32 y.o. , MRN: [id number] , ADDRESS: [address] [place] [address] PCP [name], MD Patient Care Team: [name], MD as PCP - General (Internal Medicine) Christia Reading, MD (Otolaryngology) Odette Fraction, MD as Consulting Physician (Infectious Diseases) Tyron Russell, MD (Neurology) Livingston Diones, Feliberto Gottron, MD (Allergy and Immunology)  This Provider for this visit: Treatment Team:  Attending Provider: Kalman Shan, MD    05/05/2022 -   Chief Complaint  Patient presents with   Follow-up    Pt is  here for follow up for cough and COPD. Pt states that the cough is the same and that she is doing well. Pt is using trelelgy daily with albuterol as needed    Associated asthma and vocal cord paralysis.  HPI Kayla [name] 72 y.o. -presents for follow-up.  I last saw her in August 2023.  After that she saw a nurse practitioner.  I referred her to Dr. Providence Crosby right because of chronic cough and bilateral vocal cord/fold paralysis.  to lower left lung.   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 , MRN: [id number] , ADDRESS: [address] [place] [address] PCP [name], MD Patient Care Team: [name], MD as PCP - General (Internal Medicine) Harriette Bouillon, MD (Neurology) Christia Reading, MD (Otolaryngology) Odette Fraction, MD as  Consulting Physician (Infectious Diseases)  This Provider for this visit: Treatment Team:  Attending Provider: Kalman Shan, MD    11/04/2021 -  Transfer of care from Dr Sherene Sires to Dr Marchelle Gearing Chief Complaint  Patient presents with   Follow-up    Pt states she is about the same since last visit. States she coughs all throughout the day and will cough up phlegm that ranges from white to yellow in color. States that she also has occasional wheezing.     HPI Kayla [name] 72 y.o. - she has bilateral vocal cord paralysius Follows with DR [name]. DEcades ago has seen Dr Gretta Cool at [facility] who recommended adipose injection but she did not enjoy the interaction. Cough been going on for many years. Ws on gbapntin for neuropathy for many years and stopped this year but this did not make any difference in cough. She has had GI eval . She has hx of potential aspiration. She has hx of PNA. LAst CT May 2023. She had PFT - she says they told her id c/w asthma but in my view  it is cw vocal cord paralysis. She has not had allergy testing. Feno is normal.  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   For her MS she is off therapy. -0  - follows with doc at [facility]   She says she is  immune suppresed and is on IvIG - Dr Loralyn Freshwater In [place]      OV 05/05/2022  Subjective:  Patient ID: Kayla [name], female , DOB: 1950-06-13 , age 32 y.o. , MRN: [id number] , ADDRESS: [address] [place] [address] PCP [name], MD Patient Care Team: [name], MD as PCP - General (Internal Medicine) Christia Reading, MD (Otolaryngology) Odette Fraction, MD as Consulting Physician (Infectious Diseases) Tyron Russell, MD (Neurology) Livingston Diones, Feliberto Gottron, MD (Allergy and Immunology)  This Provider for this visit: Treatment Team:  Attending Provider: Kalman Shan, MD    05/05/2022 -   Chief Complaint  Patient presents with   Follow-up    Pt is  here for follow up for cough and COPD. Pt states that the cough is the same and that she is doing well. Pt is using trelelgy daily with albuterol as needed    Associated asthma and vocal cord paralysis.  HPI Kayla [name] 72 y.o. -presents for follow-up.  I last saw her in August 2023.  After that she saw a nurse practitioner.  I referred her to Dr. Providence Crosby right because of chronic cough and bilateral vocal cord/fold paralysis.  Dr. Tonia Brooms, or Marchelle Gearing  next week in the [place] office with a CXR to ensure you are recovering from your pneumonia.  Call if you get worse not better. Consider repeat swallow eval. Please contact office for sooner follow up if symptoms do not improve or worsen or seek emergency care     xxxxxxxxxxxxxxxxxxxxxxxxxxxxxxxxxxxxxxxxxxxxxxxx[id number]   09/16/2022 Pt. Presents for hospital follow up. Upon arrival she was in respiratory distress.  Sats were  81% on RA .She was  audibly wheezing and coughing. She was gasping for air. She was using accessory muscles, nasal flaring was present, she had sternal retractions. She was not discharged with oxygen, and her sats have been very low( in the 70's)  for 24-48 hours per her husband. They have not been using albuterol nebs.  She was placed on oxygen 4L, sats initially responded and then dropped again. She is now on 6 L with sats of 96%. She was given a xopenex neb ( .63 mg/ 3cc's) She has a cough which is productive with brown secretions . She has been having chills for 7-8 days.  They have not checked her temperature. She did see her PCP last Tuesday and they had a discussion about Palliative care. I am concerned she has recurrent pneumonia. Breath sounds are diminished per right base and she is in bronchospasm.  She has been doing poorly over the last week. . She had run out of her albuterol, she did have a refill, but they were not aware. They have been using 3% saline. Pt. Husband endorses that they should have gone to the ED sooner, but they were hoping to get in to see immunology this week to resume her IVIG treatments.  EMS was called and patient has been transported to [facility] on oxygen with paramedic. She will need oxygen at discharge and she will also need a chest vest for mobilization of  secretions/ bronchiectasis. She need immunology to resume her IVIG infusions.   Test Results: Ig levels 12/22/19 >> IgA 51, IgG 289, IgM 27 (all low) PFT 10/24/21 >> FEV1 1.59 (72%), FEV1% 71, TLC 4.92 (100%), DLCO 60%, +BD CT chest 08/11/22 >> apical scarring, cylindrical BTX more in lower lobes, mucoid impaction and peribronchovascular nodularity in lower lobes Lt > Rt CT angio chest 08/31/22 >> debris in RLL bronchus, b/l lower lobe bronchial wall thickening with multifocal mucus plugging, scattered areas of GGO b/l, RLL area of consolidation   OV 12/18/2022  Subjective:  Patient ID: Kayla [name], female , DOB: 1950/04/14 , age 72 y.o. , MRN: [id number] , ADDRESS: [address] [place] [address] PCP [name], MD Patient Care Team: [name], MD as PCP - General (Internal Medicine) Christia Reading, MD (Otolaryngology) Odette Fraction, MD as Consulting Physician (Infectious Diseases) Tyron Russell, MD (Neurology) Livingston Diones, Feliberto Gottron, MD (Allergy and Immunology)  This Provider for this visit: Treatment Team:  Attending Provider: Kalman Shan, MD    12/18/2022 -   Chief Complaint  Patient presents with   Follow-up    Dx with PNA in May 2024 and she states there is still pna present on last cxr done 12/09/22.  She has prod cough with white sputum. Breathing is stable as long as she is using her supplemental o2.      HPI Kayla [name] 72 y.o. -returns for follow-up.  After my last visit with her she has seen Dr. Tonia Brooms who did a bronchoscopy.  She is colonized with pneumococcus.  She  to lower left lung.   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 , MRN: [id number] , ADDRESS: [address] [place] [address] PCP [name], MD Patient Care Team: [name], MD as PCP - General (Internal Medicine) Harriette Bouillon, MD (Neurology) Christia Reading, MD (Otolaryngology) Odette Fraction, MD as  Consulting Physician (Infectious Diseases)  This Provider for this visit: Treatment Team:  Attending Provider: Kalman Shan, MD    11/04/2021 -  Transfer of care from Dr Sherene Sires to Dr Marchelle Gearing Chief Complaint  Patient presents with   Follow-up    Pt states she is about the same since last visit. States she coughs all throughout the day and will cough up phlegm that ranges from white to yellow in color. States that she also has occasional wheezing.     HPI Kayla [name] 72 y.o. - she has bilateral vocal cord paralysius Follows with DR [name]. DEcades ago has seen Dr Gretta Cool at [facility] who recommended adipose injection but she did not enjoy the interaction. Cough been going on for many years. Ws on gbapntin for neuropathy for many years and stopped this year but this did not make any difference in cough. She has had GI eval . She has hx of potential aspiration. She has hx of PNA. LAst CT May 2023. She had PFT - she says they told her id c/w asthma but in my view  it is cw vocal cord paralysis. She has not had allergy testing. Feno is normal.  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   For her MS she is off therapy. -0  - follows with doc at [facility]   She says she is  immune suppresed and is on IvIG - Dr Loralyn Freshwater In [place]      OV 05/05/2022  Subjective:  Patient ID: Kayla [name], female , DOB: 1950-06-13 , age 32 y.o. , MRN: [id number] , ADDRESS: [address] [place] [address] PCP [name], MD Patient Care Team: [name], MD as PCP - General (Internal Medicine) Christia Reading, MD (Otolaryngology) Odette Fraction, MD as Consulting Physician (Infectious Diseases) Tyron Russell, MD (Neurology) Livingston Diones, Feliberto Gottron, MD (Allergy and Immunology)  This Provider for this visit: Treatment Team:  Attending Provider: Kalman Shan, MD    05/05/2022 -   Chief Complaint  Patient presents with   Follow-up    Pt is  here for follow up for cough and COPD. Pt states that the cough is the same and that she is doing well. Pt is using trelelgy daily with albuterol as needed    Associated asthma and vocal cord paralysis.  HPI Kayla [name] 72 y.o. -presents for follow-up.  I last saw her in August 2023.  After that she saw a nurse practitioner.  I referred her to Dr. Providence Crosby right because of chronic cough and bilateral vocal cord/fold paralysis.  to lower left lung.   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 , MRN: [id number] , ADDRESS: [address] [place] [address] PCP [name], MD Patient Care Team: [name], MD as PCP - General (Internal Medicine) Harriette Bouillon, MD (Neurology) Christia Reading, MD (Otolaryngology) Odette Fraction, MD as  Consulting Physician (Infectious Diseases)  This Provider for this visit: Treatment Team:  Attending Provider: Kalman Shan, MD    11/04/2021 -  Transfer of care from Dr Sherene Sires to Dr Marchelle Gearing Chief Complaint  Patient presents with   Follow-up    Pt states she is about the same since last visit. States she coughs all throughout the day and will cough up phlegm that ranges from white to yellow in color. States that she also has occasional wheezing.     HPI Kayla [name] 72 y.o. - she has bilateral vocal cord paralysius Follows with DR [name]. DEcades ago has seen Dr Gretta Cool at [facility] who recommended adipose injection but she did not enjoy the interaction. Cough been going on for many years. Ws on gbapntin for neuropathy for many years and stopped this year but this did not make any difference in cough. She has had GI eval . She has hx of potential aspiration. She has hx of PNA. LAst CT May 2023. She had PFT - she says they told her id c/w asthma but in my view  it is cw vocal cord paralysis. She has not had allergy testing. Feno is normal.  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   For her MS she is off therapy. -0  - follows with doc at [facility]   She says she is  immune suppresed and is on IvIG - Dr Loralyn Freshwater [place]      OV 05/05/2022  Subjective:  Patient ID: Kayla [name], female , DOB: 1950-06-13 , age 32 y.o. , MRN: [id number] , ADDRESS: [address] [place] [address] PCP [name], MD Patient Care Team: [name], MD as PCP - General (Internal Medicine) Christia Reading, MD (Otolaryngology) Odette Fraction, MD as Consulting Physician (Infectious Diseases) Tyron Russell, MD (Neurology) Livingston Diones, Feliberto Gottron, MD (Allergy and Immunology)  This Provider for this visit: Treatment Team:  Attending Provider: Kalman Shan, MD    05/05/2022 -   Chief Complaint  Patient presents with   Follow-up    Pt is  here for follow up for cough and COPD. Pt states that the cough is the same and that she is doing well. Pt is using trelelgy daily with albuterol as needed    Associated asthma and vocal cord paralysis.  HPI Kayla [name] 72 y.o. -presents for follow-up.  I last saw her in August 2023.  After that she saw a nurse practitioner.  I referred her to Dr. Providence Crosby right because of chronic cough and bilateral vocal cord/fold paralysis.  to lower left lung.   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 , MRN: [id number] , ADDRESS: [address] [place] [address] PCP [name], MD Patient Care Team: [name], MD as PCP - General (Internal Medicine) Harriette Bouillon, MD (Neurology) Christia Reading, MD (Otolaryngology) Odette Fraction, MD as  Consulting Physician (Infectious Diseases)  This Provider for this visit: Treatment Team:  Attending Provider: Kalman Shan, MD    11/04/2021 -  Transfer of care from Dr Sherene Sires to Dr Marchelle Gearing Chief Complaint  Patient presents with   Follow-up    Pt states she is about the same since last visit. States she coughs all throughout the day and will cough up phlegm that ranges from white to yellow in color. States that she also has occasional wheezing.     HPI Kayla [name] 72 y.o. - she has bilateral vocal cord paralysius Follows with DR [name]. DEcades ago has seen Dr Gretta Cool at [facility] who recommended adipose injection but she did not enjoy the interaction. Cough been going on for many years. Ws on gbapntin for neuropathy for many years and stopped this year but this did not make any difference in cough. She has had GI eval . She has hx of potential aspiration. She has hx of PNA. LAst CT May 2023. She had PFT - she says they told her id c/w asthma but in my view  it is cw vocal cord paralysis. She has not had allergy testing. Feno is normal.  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   For her MS she is off therapy. -0  - follows with doc at [facility]   She says she is  immune suppresed and is on IvIG - Dr Loralyn Freshwater [place]      OV 05/05/2022  Subjective:  Patient ID: Kayla [name], female , DOB: 1950-06-13 , age 32 y.o. , MRN: [id number] , ADDRESS: [address] [place] [address] PCP [name], MD Patient Care Team: [name], MD as PCP - General (Internal Medicine) Christia Reading, MD (Otolaryngology) Odette Fraction, MD as Consulting Physician (Infectious Diseases) Tyron Russell, MD (Neurology) Livingston Diones, Feliberto Gottron, MD (Allergy and Immunology)  This Provider for this visit: Treatment Team:  Attending Provider: Kalman Shan, MD    05/05/2022 -   Chief Complaint  Patient presents with   Follow-up    Pt is  here for follow up for cough and COPD. Pt states that the cough is the same and that she is doing well. Pt is using trelelgy daily with albuterol as needed    Associated asthma and vocal cord paralysis.  HPI Kayla [name] 72 y.o. -presents for follow-up.  I last saw her in August 2023.  After that she saw a nurse practitioner.  I referred her to Dr. Providence Crosby right because of chronic cough and bilateral vocal cord/fold paralysis.  Dr. Tonia Brooms, or Marchelle Gearing  next week in the [place] office with a CXR to ensure you are recovering from your pneumonia.  Call if you get worse not better. Consider repeat swallow eval. Please contact office for sooner follow up if symptoms do not improve or worsen or seek emergency care     xxxxxxxxxxxxxxxxxxxxxxxxxxxxxxxxxxxxxxxxxxxxxxxx[id number]   09/16/2022 Pt. Presents for hospital follow up. Upon arrival she was in respiratory distress.  Sats were  81% on RA .She was  audibly wheezing and coughing. She was gasping for air. She was using accessory muscles, nasal flaring was present, she had sternal retractions. She was not discharged with oxygen, and her sats have been very low( in the 70's)  for 24-48 hours per her husband. They have not been using albuterol nebs.  She was placed on oxygen 4L, sats initially responded and then dropped again. She is now on 6 L with sats of 96%. She was given a xopenex neb ( .63 mg/ 3cc's) She has a cough which is productive with brown secretions . She has been having chills for 7-8 days.  They have not checked her temperature. She did see her PCP last Tuesday and they had a discussion about Palliative care. I am concerned she has recurrent pneumonia. Breath sounds are diminished per right base and she is in bronchospasm.  She has been doing poorly over the last week. . She had run out of her albuterol, she did have a refill, but they were not aware. They have been using 3% saline. Pt. Husband endorses that they should have gone to the ED sooner, but they were hoping to get in to see immunology this week to resume her IVIG treatments.  EMS was called and patient has been transported to [facility] on oxygen with paramedic. She will need oxygen at discharge and she will also need a chest vest for mobilization of  secretions/ bronchiectasis. She need immunology to resume her IVIG infusions.   Test Results: Ig levels 12/22/19 >> IgA 51, IgG 289, IgM 27 (all low) PFT 10/24/21 >> FEV1 1.59 (72%), FEV1% 71, TLC 4.92 (100%), DLCO 60%, +BD CT chest 08/11/22 >> apical scarring, cylindrical BTX more in lower lobes, mucoid impaction and peribronchovascular nodularity in lower lobes Lt > Rt CT angio chest 08/31/22 >> debris in RLL bronchus, b/l lower lobe bronchial wall thickening with multifocal mucus plugging, scattered areas of GGO b/l, RLL area of consolidation   OV 12/18/2022  Subjective:  Patient ID: Kayla [name], female , DOB: 1950/04/14 , age 72 y.o. , MRN: [id number] , ADDRESS: [address] [place] [address] PCP [name], MD Patient Care Team: [name], MD as PCP - General (Internal Medicine) Christia Reading, MD (Otolaryngology) Odette Fraction, MD as Consulting Physician (Infectious Diseases) Tyron Russell, MD (Neurology) Livingston Diones, Feliberto Gottron, MD (Allergy and Immunology)  This Provider for this visit: Treatment Team:  Attending Provider: Kalman Shan, MD    12/18/2022 -   Chief Complaint  Patient presents with   Follow-up    Dx with PNA in May 2024 and she states there is still pna present on last cxr done 12/09/22.  She has prod cough with white sputum. Breathing is stable as long as she is using her supplemental o2.      HPI Kayla [name] 72 y.o. -returns for follow-up.  After my last visit with her she has seen Dr. Tonia Brooms who did a bronchoscopy.  She is colonized with pneumococcus.  She  Dr. Tonia Brooms, or Marchelle Gearing  next week in the [place] office with a CXR to ensure you are recovering from your pneumonia.  Call if you get worse not better. Consider repeat swallow eval. Please contact office for sooner follow up if symptoms do not improve or worsen or seek emergency care     xxxxxxxxxxxxxxxxxxxxxxxxxxxxxxxxxxxxxxxxxxxxxxxx[id number]   09/16/2022 Pt. Presents for hospital follow up. Upon arrival she was in respiratory distress.  Sats were  81% on RA .She was  audibly wheezing and coughing. She was gasping for air. She was using accessory muscles, nasal flaring was present, she had sternal retractions. She was not discharged with oxygen, and her sats have been very low( in the 70's)  for 24-48 hours per her husband. They have not been using albuterol nebs.  She was placed on oxygen 4L, sats initially responded and then dropped again. She is now on 6 L with sats of 96%. She was given a xopenex neb ( .63 mg/ 3cc's) She has a cough which is productive with brown secretions . She has been having chills for 7-8 days.  They have not checked her temperature. She did see her PCP last Tuesday and they had a discussion about Palliative care. I am concerned she has recurrent pneumonia. Breath sounds are diminished per right base and she is in bronchospasm.  She has been doing poorly over the last week. . She had run out of her albuterol, she did have a refill, but they were not aware. They have been using 3% saline. Pt. Husband endorses that they should have gone to the ED sooner, but they were hoping to get in to see immunology this week to resume her IVIG treatments.  EMS was called and patient has been transported to [facility] on oxygen with paramedic. She will need oxygen at discharge and she will also need a chest vest for mobilization of  secretions/ bronchiectasis. She need immunology to resume her IVIG infusions.   Test Results: Ig levels 12/22/19 >> IgA 51, IgG 289, IgM 27 (all low) PFT 10/24/21 >> FEV1 1.59 (72%), FEV1% 71, TLC 4.92 (100%), DLCO 60%, +BD CT chest 08/11/22 >> apical scarring, cylindrical BTX more in lower lobes, mucoid impaction and peribronchovascular nodularity in lower lobes Lt > Rt CT angio chest 08/31/22 >> debris in RLL bronchus, b/l lower lobe bronchial wall thickening with multifocal mucus plugging, scattered areas of GGO b/l, RLL area of consolidation   OV 12/18/2022  Subjective:  Patient ID: Kayla [name], female , DOB: 1950/04/14 , age 72 y.o. , MRN: [id number] , ADDRESS: [address] [place] [address] PCP [name], MD Patient Care Team: [name], MD as PCP - General (Internal Medicine) Christia Reading, MD (Otolaryngology) Odette Fraction, MD as Consulting Physician (Infectious Diseases) Tyron Russell, MD (Neurology) Livingston Diones, Feliberto Gottron, MD (Allergy and Immunology)  This Provider for this visit: Treatment Team:  Attending Provider: Kalman Shan, MD    12/18/2022 -   Chief Complaint  Patient presents with   Follow-up    Dx with PNA in May 2024 and she states there is still pna present on last cxr done 12/09/22.  She has prod cough with white sputum. Breathing is stable as long as she is using her supplemental o2.      HPI Kayla [name] 72 y.o. -returns for follow-up.  After my last visit with her she has seen Dr. Tonia Brooms who did a bronchoscopy.  She is colonized with pneumococcus.  She  to lower left lung.   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 , MRN: [id number] , ADDRESS: [address] [place] [address] PCP [name], MD Patient Care Team: [name], MD as PCP - General (Internal Medicine) Harriette Bouillon, MD (Neurology) Christia Reading, MD (Otolaryngology) Odette Fraction, MD as  Consulting Physician (Infectious Diseases)  This Provider for this visit: Treatment Team:  Attending Provider: Kalman Shan, MD    11/04/2021 -  Transfer of care from Dr Sherene Sires to Dr Marchelle Gearing Chief Complaint  Patient presents with   Follow-up    Pt states she is about the same since last visit. States she coughs all throughout the day and will cough up phlegm that ranges from white to yellow in color. States that she also has occasional wheezing.     HPI Kayla [name] 72 y.o. - she has bilateral vocal cord paralysius Follows with DR [name]. DEcades ago has seen Dr Gretta Cool at [facility] who recommended adipose injection but she did not enjoy the interaction. Cough been going on for many years. Ws on gbapntin for neuropathy for many years and stopped this year but this did not make any difference in cough. She has had GI eval . She has hx of potential aspiration. She has hx of PNA. LAst CT May 2023. She had PFT - she says they told her id c/w asthma but in my view  it is cw vocal cord paralysis. She has not had allergy testing. Feno is normal.  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   For her MS she is off therapy. -0  - follows with doc at [facility]   She says she is  immune suppresed and is on IvIG - Dr Loralyn Freshwater [place]      OV 05/05/2022  Subjective:  Patient ID: Kayla [name], female , DOB: 1950-06-13 , age 32 y.o. , MRN: [id number] , ADDRESS: [address] [place] [address] PCP [name], MD Patient Care Team: [name], MD as PCP - General (Internal Medicine) Christia Reading, MD (Otolaryngology) Odette Fraction, MD as Consulting Physician (Infectious Diseases) Tyron Russell, MD (Neurology) Livingston Diones, Feliberto Gottron, MD (Allergy and Immunology)  This Provider for this visit: Treatment Team:  Attending Provider: Kalman Shan, MD    05/05/2022 -   Chief Complaint  Patient presents with   Follow-up    Pt is  here for follow up for cough and COPD. Pt states that the cough is the same and that she is doing well. Pt is using trelelgy daily with albuterol as needed    Associated asthma and vocal cord paralysis.  HPI Kayla [name] 72 y.o. -presents for follow-up.  I last saw her in August 2023.  After that she saw a nurse practitioner.  I referred her to Dr. Providence Crosby right because of chronic cough and bilateral vocal cord/fold paralysis.  to lower left lung.   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 , MRN: [id number] , ADDRESS: [address] [place] [address] PCP [name], MD Patient Care Team: [name], MD as PCP - General (Internal Medicine) Harriette Bouillon, MD (Neurology) Christia Reading, MD (Otolaryngology) Odette Fraction, MD as  Consulting Physician (Infectious Diseases)  This Provider for this visit: Treatment Team:  Attending Provider: Kalman Shan, MD    11/04/2021 -  Transfer of care from Dr Sherene Sires to Dr Marchelle Gearing Chief Complaint  Patient presents with   Follow-up    Pt states she is about the same since last visit. States she coughs all throughout the day and will cough up phlegm that ranges from white to yellow in color. States that she also has occasional wheezing.     HPI Kayla [name] 72 y.o. - she has bilateral vocal cord paralysius Follows with DR [name]. DEcades ago has seen Dr Gretta Cool at [facility] who recommended adipose injection but she did not enjoy the interaction. Cough been going on for many years. Ws on gbapntin for neuropathy for many years and stopped this year but this did not make any difference in cough. She has had GI eval . She has hx of potential aspiration. She has hx of PNA. LAst CT May 2023. She had PFT - she says they told her id c/w asthma but in my view  it is cw vocal cord paralysis. She has not had allergy testing. Feno is normal.  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   For her MS she is off therapy. -0  - follows with doc at [facility]   She says she is  immune suppresed and is on IvIG - Dr Loralyn Freshwater [place]      OV 05/05/2022  Subjective:  Patient ID: Kayla [name], female , DOB: 1950-06-13 , age 32 y.o. , MRN: [id number] , ADDRESS: [address] [place] [address] PCP [name], MD Patient Care Team: [name], MD as PCP - General (Internal Medicine) Christia Reading, MD (Otolaryngology) Odette Fraction, MD as Consulting Physician (Infectious Diseases) Tyron Russell, MD (Neurology) Livingston Diones, Feliberto Gottron, MD (Allergy and Immunology)  This Provider for this visit: Treatment Team:  Attending Provider: Kalman Shan, MD    05/05/2022 -   Chief Complaint  Patient presents with   Follow-up    Pt is  here for follow up for cough and COPD. Pt states that the cough is the same and that she is doing well. Pt is using trelelgy daily with albuterol as needed    Associated asthma and vocal cord paralysis.  HPI Kayla [name] 72 y.o. -presents for follow-up.  I last saw her in August 2023.  After that she saw a nurse practitioner.  I referred her to Dr. Providence Crosby right because of chronic cough and bilateral vocal cord/fold paralysis.  to lower left lung.   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 , MRN: [id number] , ADDRESS: [address] [place] [id number] PCP [name], MD Patient Care Team: [name], MD as PCP - General (Internal Medicine) Harriette Bouillon, MD (Neurology) Christia Reading, MD (Otolaryngology) Odette Fraction, MD as  Consulting Physician (Infectious Diseases)  This Provider for this visit: Treatment Team:  Attending Provider: Kalman Shan, MD    11/04/2021 -  Transfer of care from Dr Sherene Sires to Dr Marchelle Gearing Chief Complaint  Patient presents with   Follow-up    Pt states she is about the same since last visit. States she coughs all throughout the day and will cough up phlegm that ranges from white to yellow in color. States that she also has occasional wheezing.     HPI Kayla [name] 72 y.o. - she has bilateral vocal cord paralysius Follows with DR [name]. DEcades ago has seen Dr Gretta Cool at [facility] who recommended adipose injection but she did not enjoy the interaction. Cough been going on for many years. Ws on gbapntin for neuropathy for many years and stopped this year but this did not make any difference in cough. She has had GI eval . She has hx of potential aspiration. She has hx of PNA. LAst CT May 2023. She had PFT - she says they told her id c/w asthma but in my view  it is cw vocal cord paralysis. She has not had allergy testing. Feno is normal.  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   For her MS she is off therapy. -0  - follows with doc at [facility]   She says she is  immune suppresed and is on IvIG - Dr Loralyn Freshwater [place]      OV 05/05/2022  Subjective:  Patient ID: Kayla [name], female , DOB: 1950-06-13 , age 32 y.o. , MRN: [id number] , ADDRESS: [address] [place] [address] PCP [name], MD Patient Care Team: [name], MD as PCP - General (Internal Medicine) Christia Reading, MD (Otolaryngology) Odette Fraction, MD as Consulting Physician (Infectious Diseases) Tyron Russell, MD (Neurology) Livingston Diones, Feliberto Gottron, MD (Allergy and Immunology)  This Provider for this visit: Treatment Team:  Attending Provider: Kalman Shan, MD    05/05/2022 -   Chief Complaint  Patient presents with   Follow-up    Pt is  here for follow up for cough and COPD. Pt states that the cough is the same and that she is doing well. Pt is using trelelgy daily with albuterol as needed    Associated asthma and vocal cord paralysis.  HPI Kayla [name] 72 y.o. -presents for follow-up.  I last saw her in August 2023.  After that she saw a nurse practitioner.  I referred her to Dr. Providence Crosby right because of chronic cough and bilateral vocal cord/fold paralysis.  to lower left lung.   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 , MRN: [id number] , ADDRESS: [address] [place] [id number] PCP [name], MD Patient Care Team: [name], MD as PCP - General (Internal Medicine) Harriette Bouillon, MD (Neurology) Christia Reading, MD (Otolaryngology) Odette Fraction, MD as  Consulting Physician (Infectious Diseases)  This Provider for this visit: Treatment Team:  Attending Provider: Kalman Shan, MD    11/04/2021 -  Transfer of care from Dr Sherene Sires to Dr Marchelle Gearing Chief Complaint  Patient presents with   Follow-up    Pt states she is about the same since last visit. States she coughs all throughout the day and will cough up phlegm that ranges from white to yellow in color. States that she also has occasional wheezing.     HPI Kayla [name] 72 y.o. - she has bilateral vocal cord paralysius Follows with DR [name]. DEcades ago has seen Dr Gretta Cool at [facility] who recommended adipose injection but she did not enjoy the interaction. Cough been going on for many years. Ws on gbapntin for neuropathy for many years and stopped this year but this did not make any difference in cough. She has had GI eval . She has hx of potential aspiration. She has hx of PNA. LAst CT May 2023. She had PFT - she says they told her id c/w asthma but in my view  it is cw vocal cord paralysis. She has not had allergy testing. Feno is normal.  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   For her MS she is off therapy. -0  - follows with doc at [facility]   She says she is  immune suppresed and is on IvIG - Dr Loralyn Freshwater [place]      OV 05/05/2022  Subjective:  Patient ID: Kayla [name], female , DOB: 1950-06-13 , age 32 y.o. , MRN: [id number] , ADDRESS: [address] [place] [address] PCP [name], MD Patient Care Team: [name], MD as PCP - General (Internal Medicine) Christia Reading, MD (Otolaryngology) Odette Fraction, MD as Consulting Physician (Infectious Diseases) Tyron Russell, MD (Neurology) Livingston Diones, Feliberto Gottron, MD (Allergy and Immunology)  This Provider for this visit: Treatment Team:  Attending Provider: Kalman Shan, MD    05/05/2022 -   Chief Complaint  Patient presents with   Follow-up    Pt is  here for follow up for cough and COPD. Pt states that the cough is the same and that she is doing well. Pt is using trelelgy daily with albuterol as needed    Associated asthma and vocal cord paralysis.  HPI Kayla [name] 72 y.o. -presents for follow-up.  I last saw her in August 2023.  After that she saw a nurse practitioner.  I referred her to Dr. Providence Crosby right because of chronic cough and bilateral vocal cord/fold paralysis.  Dr. Tonia Brooms, or Marchelle Gearing  next week in the [place] office with a CXR to ensure you are recovering from your pneumonia.  Call if you get worse not better. Consider repeat swallow eval. Please contact office for sooner follow up if symptoms do not improve or worsen or seek emergency care     xxxxxxxxxxxxxxxxxxxxxxxxxxxxxxxxxxxxxxxxxxxxxxxx[id number]   09/16/2022 Pt. Presents for hospital follow up. Upon arrival she was in respiratory distress.  Sats were  81% on RA .She was  audibly wheezing and coughing. She was gasping for air. She was using accessory muscles, nasal flaring was present, she had sternal retractions. She was not discharged with oxygen, and her sats have been very low( in the 70's)  for 24-48 hours per her husband. They have not been using albuterol nebs.  She was placed on oxygen 4L, sats initially responded and then dropped again. She is now on 6 L with sats of 96%. She was given a xopenex neb ( .63 mg/ 3cc's) She has a cough which is productive with brown secretions . She has been having chills for 7-8 days.  They have not checked her temperature. She did see her PCP last Tuesday and they had a discussion about Palliative care. I am concerned she has recurrent pneumonia. Breath sounds are diminished per right base and she is in bronchospasm.  She has been doing poorly over the last week. . She had run out of her albuterol, she did have a refill, but they were not aware. They have been using 3% saline. Pt. Husband endorses that they should have gone to the ED sooner, but they were hoping to get in to see immunology this week to resume her IVIG treatments.  EMS was called and patient has been transported to [facility] on oxygen with paramedic. She will need oxygen at discharge and she will also need a chest vest for mobilization of  secretions/ bronchiectasis. She need immunology to resume her IVIG infusions.   Test Results: Ig levels 12/22/19 >> IgA 51, IgG 289, IgM 27 (all low) PFT 10/24/21 >> FEV1 1.59 (72%), FEV1% 71, TLC 4.92 (100%), DLCO 60%, +BD CT chest 08/11/22 >> apical scarring, cylindrical BTX more in lower lobes, mucoid impaction and peribronchovascular nodularity in lower lobes Lt > Rt CT angio chest 08/31/22 >> debris in RLL bronchus, b/l lower lobe bronchial wall thickening with multifocal mucus plugging, scattered areas of GGO b/l, RLL area of consolidation   OV 12/18/2022  Subjective:  Patient ID: Kayla [name], female , DOB: 1950/04/14 , age 72 y.o. , MRN: [id number] , ADDRESS: [address] [place] [address] PCP [name], MD Patient Care Team: [name], MD as PCP - General (Internal Medicine) Christia Reading, MD (Otolaryngology) Odette Fraction, MD as Consulting Physician (Infectious Diseases) Tyron Russell, MD (Neurology) Livingston Diones, Feliberto Gottron, MD (Allergy and Immunology)  This Provider for this visit: Treatment Team:  Attending Provider: Kalman Shan, MD    12/18/2022 -   Chief Complaint  Patient presents with   Follow-up    Dx with PNA in May 2024 and she states there is still pna present on last cxr done 12/09/22.  She has prod cough with white sputum. Breathing is stable as long as she is using her supplemental o2.      HPI Kayla [name] 72 y.o. -returns for follow-up.  After my last visit with her she has seen Dr. Tonia Brooms who did a bronchoscopy.  She is colonized with pneumococcus.  She  to lower left lung.   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 , MRN: [id number] , ADDRESS: [address] [place] [id number] PCP [name], MD Patient Care Team: [name], MD as PCP - General (Internal Medicine) Harriette Bouillon, MD (Neurology) Christia Reading, MD (Otolaryngology) Odette Fraction, MD as  Consulting Physician (Infectious Diseases)  This Provider for this visit: Treatment Team:  Attending Provider: Kalman Shan, MD    11/04/2021 -  Transfer of care from Dr Sherene Sires to Dr Marchelle Gearing Chief Complaint  Patient presents with   Follow-up    Pt states she is about the same since last visit. States she coughs all throughout the day and will cough up phlegm that ranges from white to yellow in color. States that she also has occasional wheezing.     HPI Kayla [name] 72 y.o. - she has bilateral vocal cord paralysius Follows with DR [name]. DEcades ago has seen Dr Gretta Cool at [facility] who recommended adipose injection but she did not enjoy the interaction. Cough been going on for many years. Ws on gbapntin for neuropathy for many years and stopped this year but this did not make any difference in cough. She has had GI eval . She has hx of potential aspiration. She has hx of PNA. LAst CT May 2023. She had PFT - she says they told her id c/w asthma but in my view  it is cw vocal cord paralysis. She has not had allergy testing. Feno is normal.  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   For her MS she is off therapy. -0  - follows with doc at [facility]   She says she is  immune suppresed and is on IvIG - Dr Loralyn Freshwater [place]      OV 05/05/2022  Subjective:  Patient ID: Kayla [name], female , DOB: 1950-06-13 , age 32 y.o. , MRN: [id number] , ADDRESS: [address] [place] [address] PCP [name], MD Patient Care Team: [name], MD as PCP - General (Internal Medicine) Christia Reading, MD (Otolaryngology) Odette Fraction, MD as Consulting Physician (Infectious Diseases) Tyron Russell, MD (Neurology) Livingston Diones, Feliberto Gottron, MD (Allergy and Immunology)  This Provider for this visit: Treatment Team:  Attending Provider: Kalman Shan, MD    05/05/2022 -   Chief Complaint  Patient presents with   Follow-up    Pt is  here for follow up for cough and COPD. Pt states that the cough is the same and that she is doing well. Pt is using trelelgy daily with albuterol as needed    Associated asthma and vocal cord paralysis.  HPI Kayla [name] 72 y.o. -presents for follow-up.  I last saw her in August 2023.  After that she saw a nurse practitioner.  I referred her to Dr. Providence Crosby right because of chronic cough and bilateral vocal cord/fold paralysis.  Dr. Tonia Brooms, or Marchelle Gearing  next week in the [place] office with a CXR to ensure you are recovering from your pneumonia.  Call if you get worse not better. Consider repeat swallow eval. Please contact office for sooner follow up if symptoms do not improve or worsen or seek emergency care     xxxxxxxxxxxxxxxxxxxxxxxxxxxxxxxxxxxxxxxxxxxxxxxx[id number]   09/16/2022 Pt. Presents for hospital follow up. Upon arrival she was in respiratory distress.  Sats were  81% on RA .She was  audibly wheezing and coughing. She was gasping for air. She was using accessory muscles, nasal flaring was present, she had sternal retractions. She was not discharged with oxygen, and her sats have been very low( in the 70's)  for 24-48 hours per her husband. They have not been using albuterol nebs.  She was placed on oxygen 4L, sats initially responded and then dropped again. She is now on 6 L with sats of 96%. She was given a xopenex neb ( .63 mg/ 3cc's) She has a cough which is productive with brown secretions . She has been having chills for 7-8 days.  They have not checked her temperature. She did see her PCP last Tuesday and they had a discussion about Palliative care. I am concerned she has recurrent pneumonia. Breath sounds are diminished per right base and she is in bronchospasm.  She has been doing poorly over the last week. . She had run out of her albuterol, she did have a refill, but they were not aware. They have been using 3% saline. Pt. Husband endorses that they should have gone to the ED sooner, but they were hoping to get in to see immunology this week to resume her IVIG treatments.  EMS was called and patient has been transported to [facility] on oxygen with paramedic. She will need oxygen at discharge and she will also need a chest vest for mobilization of  secretions/ bronchiectasis. She need immunology to resume her IVIG infusions.   Test Results: Ig levels 12/22/19 >> IgA 51, IgG 289, IgM 27 (all low) PFT 10/24/21 >> FEV1 1.59 (72%), FEV1% 71, TLC 4.92 (100%), DLCO 60%, +BD CT chest 08/11/22 >> apical scarring, cylindrical BTX more in lower lobes, mucoid impaction and peribronchovascular nodularity in lower lobes Lt > Rt CT angio chest 08/31/22 >> debris in RLL bronchus, b/l lower lobe bronchial wall thickening with multifocal mucus plugging, scattered areas of GGO b/l, RLL area of consolidation   OV 12/18/2022  Subjective:  Patient ID: Kayla [name], female , DOB: 1950/04/14 , age 72 y.o. , MRN: [id number] , ADDRESS: [address] [place] [address] PCP [name], MD Patient Care Team: [name], MD as PCP - General (Internal Medicine) Christia Reading, MD (Otolaryngology) Odette Fraction, MD as Consulting Physician (Infectious Diseases) Tyron Russell, MD (Neurology) Livingston Diones, Feliberto Gottron, MD (Allergy and Immunology)  This Provider for this visit: Treatment Team:  Attending Provider: Kalman Shan, MD    12/18/2022 -   Chief Complaint  Patient presents with   Follow-up    Dx with PNA in May 2024 and she states there is still pna present on last cxr done 12/09/22.  She has prod cough with white sputum. Breathing is stable as long as she is using her supplemental o2.      HPI Kayla [name] 72 y.o. -returns for follow-up.  After my last visit with her she has seen Dr. Tonia Brooms who did a bronchoscopy.  She is colonized with pneumococcus.  She  to lower left lung.   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 , MRN: [id number] , ADDRESS: [address] [place] [id number] PCP [name], MD Patient Care Team: [name], MD as PCP - General (Internal Medicine) Harriette Bouillon, MD (Neurology) Christia Reading, MD (Otolaryngology) Odette Fraction, MD as  Consulting Physician (Infectious Diseases)  This Provider for this visit: Treatment Team:  Attending Provider: Kalman Shan, MD    11/04/2021 -  Transfer of care from Dr Sherene Sires to Dr Marchelle Gearing Chief Complaint  Patient presents with   Follow-up    Pt states she is about the same since last visit. States she coughs all throughout the day and will cough up phlegm that ranges from white to yellow in color. States that she also has occasional wheezing.     HPI Kayla [name] 72 y.o. - she has bilateral vocal cord paralysius Follows with DR [name]. DEcades ago has seen Dr Gretta Cool at [facility] who recommended adipose injection but she did not enjoy the interaction. Cough been going on for many years. Ws on gbapntin for neuropathy for many years and stopped this year but this did not make any difference in cough. She has had GI eval . She has hx of potential aspiration. She has hx of PNA. LAst CT May 2023. She had PFT - she says they told her id c/w asthma but in my view  it is cw vocal cord paralysis. She has not had allergy testing. Feno is normal.  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   For her MS she is off therapy. -0  - follows with doc at [facility]   She says she is  immune suppresed and is on IvIG - Dr Loralyn Freshwater [place]      OV 05/05/2022  Subjective:  Patient ID: Kayla [name], female , DOB: 1950-06-13 , age 32 y.o. , MRN: [id number] , ADDRESS: [address] [place] [address] PCP [name], MD Patient Care Team: [name], MD as PCP - General (Internal Medicine) Christia Reading, MD (Otolaryngology) Odette Fraction, MD as Consulting Physician (Infectious Diseases) Tyron Russell, MD (Neurology) Livingston Diones, Feliberto Gottron, MD (Allergy and Immunology)  This Provider for this visit: Treatment Team:  Attending Provider: Kalman Shan, MD    05/05/2022 -   Chief Complaint  Patient presents with   Follow-up    Pt is  here for follow up for cough and COPD. Pt states that the cough is the same and that she is doing well. Pt is using trelelgy daily with albuterol as needed    Associated asthma and vocal cord paralysis.  HPI Kayla [name] 72 y.o. -presents for follow-up.  I last saw her in August 2023.  After that she saw a nurse practitioner.  I referred her to Dr. Providence Crosby right because of chronic cough and bilateral vocal cord/fold paralysis.  Dr. Tonia Brooms, or Marchelle Gearing  next week in the [place] office with a CXR to ensure you are recovering from your pneumonia.  Call if you get worse not better. Consider repeat swallow eval. Please contact office for sooner follow up if symptoms do not improve or worsen or seek emergency care     xxxxxxxxxxxxxxxxxxxxxxxxxxxxxxxxxxxxxxxxxxxxxxxx[id number]   09/16/2022 Pt. Presents for hospital follow up. Upon arrival she was in respiratory distress.  Sats were  81% on RA .She was  audibly wheezing and coughing. She was gasping for air. She was using accessory muscles, nasal flaring was present, she had sternal retractions. She was not discharged with oxygen, and her sats have been very low( in the 70's)  for 24-48 hours per her husband. They have not been using albuterol nebs.  She was placed on oxygen 4L, sats initially responded and then dropped again. She is now on 6 L with sats of 96%. She was given a xopenex neb ( .63 mg/ 3cc's) She has a cough which is productive with brown secretions . She has been having chills for 7-8 days.  They have not checked her temperature. She did see her PCP last Tuesday and they had a discussion about Palliative care. I am concerned she has recurrent pneumonia. Breath sounds are diminished per right base and she is in bronchospasm.  She has been doing poorly over the last week. . She had run out of her albuterol, she did have a refill, but they were not aware. They have been using 3% saline. Pt. Husband endorses that they should have gone to the ED sooner, but they were hoping to get in to see immunology this week to resume her IVIG treatments.  EMS was called and patient has been transported to [facility] on oxygen with paramedic. She will need oxygen at discharge and she will also need a chest vest for mobilization of  secretions/ bronchiectasis. She need immunology to resume her IVIG infusions.   Test Results: Ig levels 12/22/19 >> IgA 51, IgG 289, IgM 27 (all low) PFT 10/24/21 >> FEV1 1.59 (72%), FEV1% 71, TLC 4.92 (100%), DLCO 60%, +BD CT chest 08/11/22 >> apical scarring, cylindrical BTX more in lower lobes, mucoid impaction and peribronchovascular nodularity in lower lobes Lt > Rt CT angio chest 08/31/22 >> debris in RLL bronchus, b/l lower lobe bronchial wall thickening with multifocal mucus plugging, scattered areas of GGO b/l, RLL area of consolidation   OV 12/18/2022  Subjective:  Patient ID: Kayla [name], female , DOB: 1950/04/14 , age 72 y.o. , MRN: [id number] , ADDRESS: [address] [place] [address] PCP [name], MD Patient Care Team: [name], MD as PCP - General (Internal Medicine) Christia Reading, MD (Otolaryngology) Odette Fraction, MD as Consulting Physician (Infectious Diseases) Tyron Russell, MD (Neurology) Livingston Diones, Feliberto Gottron, MD (Allergy and Immunology)  This Provider for this visit: Treatment Team:  Attending Provider: Kalman Shan, MD    12/18/2022 -   Chief Complaint  Patient presents with   Follow-up    Dx with PNA in May 2024 and she states there is still pna present on last cxr done 12/09/22.  She has prod cough with white sputum. Breathing is stable as long as she is using her supplemental o2.      HPI Kayla [name] 72 y.o. -returns for follow-up.  After my last visit with her she has seen Dr. Tonia Brooms who did a bronchoscopy.  She is colonized with pneumococcus.  She  Dr. Tonia Brooms, or Marchelle Gearing  next week in the [place] office with a CXR to ensure you are recovering from your pneumonia.  Call if you get worse not better. Consider repeat swallow eval. Please contact office for sooner follow up if symptoms do not improve or worsen or seek emergency care     xxxxxxxxxxxxxxxxxxxxxxxxxxxxxxxxxxxxxxxxxxxxxxxx[id number]   09/16/2022 Pt. Presents for hospital follow up. Upon arrival she was in respiratory distress.  Sats were  81% on RA .She was  audibly wheezing and coughing. She was gasping for air. She was using accessory muscles, nasal flaring was present, she had sternal retractions. She was not discharged with oxygen, and her sats have been very low( in the 70's)  for 24-48 hours per her husband. They have not been using albuterol nebs.  She was placed on oxygen 4L, sats initially responded and then dropped again. She is now on 6 L with sats of 96%. She was given a xopenex neb ( .63 mg/ 3cc's) She has a cough which is productive with brown secretions . She has been having chills for 7-8 days.  They have not checked her temperature. She did see her PCP last Tuesday and they had a discussion about Palliative care. I am concerned she has recurrent pneumonia. Breath sounds are diminished per right base and she is in bronchospasm.  She has been doing poorly over the last week. . She had run out of her albuterol, she did have a refill, but they were not aware. They have been using 3% saline. Pt. Husband endorses that they should have gone to the ED sooner, but they were hoping to get in to see immunology this week to resume her IVIG treatments.  EMS was called and patient has been transported to [facility] on oxygen with paramedic. She will need oxygen at discharge and she will also need a chest vest for mobilization of  secretions/ bronchiectasis. She need immunology to resume her IVIG infusions.   Test Results: Ig levels 12/22/19 >> IgA 51, IgG 289, IgM 27 (all low) PFT 10/24/21 >> FEV1 1.59 (72%), FEV1% 71, TLC 4.92 (100%), DLCO 60%, +BD CT chest 08/11/22 >> apical scarring, cylindrical BTX more in lower lobes, mucoid impaction and peribronchovascular nodularity in lower lobes Lt > Rt CT angio chest 08/31/22 >> debris in RLL bronchus, b/l lower lobe bronchial wall thickening with multifocal mucus plugging, scattered areas of GGO b/l, RLL area of consolidation   OV 12/18/2022  Subjective:  Patient ID: Kayla [name], female , DOB: 1950/04/14 , age 72 y.o. , MRN: [id number] , ADDRESS: [address] [place] [address] PCP [name], MD Patient Care Team: [name], MD as PCP - General (Internal Medicine) Christia Reading, MD (Otolaryngology) Odette Fraction, MD as Consulting Physician (Infectious Diseases) Tyron Russell, MD (Neurology) Livingston Diones, Feliberto Gottron, MD (Allergy and Immunology)  This Provider for this visit: Treatment Team:  Attending Provider: Kalman Shan, MD    12/18/2022 -   Chief Complaint  Patient presents with   Follow-up    Dx with PNA in May 2024 and she states there is still pna present on last cxr done 12/09/22.  She has prod cough with white sputum. Breathing is stable as long as she is using her supplemental o2.      HPI Kayla [name] 72 y.o. -returns for follow-up.  After my last visit with her she has seen Dr. Tonia Brooms who did a bronchoscopy.  She is colonized with pneumococcus.  She  Dr. Tonia Brooms, or Marchelle Gearing  next week in the [place] office with a CXR to ensure you are recovering from your pneumonia.  Call if you get worse not better. Consider repeat swallow eval. Please contact office for sooner follow up if symptoms do not improve or worsen or seek emergency care     xxxxxxxxxxxxxxxxxxxxxxxxxxxxxxxxxxxxxxxxxxxxxxxxxxxxxxxxxxxxxxxxxxxxxxxxxx   09/16/2022 Pt. Presents for hospital follow up. Upon arrival she was in respiratory distress.  Sats were  81% on RA .She was  audibly wheezing and coughing. She was gasping for air. She was using accessory muscles, nasal flaring was present, she had sternal retractions. She was not discharged with oxygen, and her sats have been very low( in the 70's)  for 24-48 hours per her husband. They have not been using albuterol nebs.  She was placed on oxygen 4L, sats initially responded and then dropped again. She is now on 6 L with sats of 96%. She was given a xopenex neb ( .63 mg/ 3cc's) She has a cough which is productive with brown secretions . She has been having chills for 7-8 days.  They have not checked her temperature. She did see her PCP last Tuesday and they had a discussion about Palliative care. I am concerned she has recurrent pneumonia. Breath sounds are diminished per right base and she is in bronchospasm.  She has been doing poorly over the last week. . She had run out of her albuterol, she did have a refill, but they were not aware. They have been using 3% saline. Pt. Husband endorses that they should have gone to the ED sooner, but they were hoping to get in to see immunology this week to resume her IVIG treatments.  EMS was called and patient has been transported to [facility] on oxygen with paramedic. She will need oxygen at discharge and she will also need a chest vest for mobilization of  secretions/ bronchiectasis. She need immunology to resume her IVIG infusions.   Test Results: Ig levels 12/22/19 >> IgA 51, IgG 289, IgM 27 (all low) PFT 10/24/21 >> FEV1 1.59 (72%), FEV1% 71, TLC 4.92 (100%), DLCO 60%, +BD CT chest 08/11/22 >> apical scarring, cylindrical BTX more in lower lobes, mucoid impaction and peribronchovascular nodularity in lower lobes Lt > Rt CT angio chest 08/31/22 >> debris in RLL bronchus, b/l lower lobe bronchial wall thickening with multifocal mucus plugging, scattered areas of GGO b/l, RLL area of consolidation   OV 12/18/2022  Subjective:  Patient ID: Kayla [name], female , DOB: 1950/04/14 , age 72 y.o. , MRN: [id number] , ADDRESS: [address] [place] [address] PCP [name], MD Patient Care Team: [name], MD as PCP - General (Internal Medicine) Christia Reading, MD (Otolaryngology) Odette Fraction, MD as Consulting Physician (Infectious Diseases) Tyron Russell, MD (Neurology) Livingston Diones, Feliberto Gottron, MD (Allergy and Immunology)  This Provider for this visit: Treatment Team:  Attending Provider: Kalman Shan, MD    12/18/2022 -   Chief Complaint  Patient presents with   Follow-up    Dx with PNA in May 2024 and she states there is still pna present on last cxr done 12/09/22.  She has prod cough with white sputum. Breathing is stable as long as she is using her supplemental o2.      HPI Kayla [name] 72 y.o. -returns for follow-up.  After my last visit with her she has seen Dr. Tonia Brooms who did a bronchoscopy.  She is colonized with pneumococcus.  She  Dr. Tonia Brooms, or Marchelle Gearing  next week in the [place] office with a CXR to ensure you are recovering from your pneumonia.  Call if you get worse not better. Consider repeat swallow eval. Please contact office for sooner follow up if symptoms do not improve or worsen or seek emergency care     xxxxxxxxxxxxxxxxxxxxxxxxxxxxxxxxxxxxxxxxxxxxxxxxxxxxxxxxxxxxxxxxxxxxxxxxxx   09/16/2022 Pt. Presents for hospital follow up. Upon arrival she was in respiratory distress.  Sats were  81% on RA .She was  audibly wheezing and coughing. She was gasping for air. She was using accessory muscles, nasal flaring was present, she had sternal retractions. She was not discharged with oxygen, and her sats have been very low( in the 70's)  for 24-48 hours per her husband. They have not been using albuterol nebs.  She was placed on oxygen 4L, sats initially responded and then dropped again. She is now on 6 L with sats of 96%. She was given a xopenex neb ( .63 mg/ 3cc's) She has a cough which is productive with brown secretions . She has been having chills for 7-8 days.  They have not checked her temperature. She did see her PCP last Tuesday and they had a discussion about Palliative care. I am concerned she has recurrent pneumonia. Breath sounds are diminished per right base and she is in bronchospasm.  She has been doing poorly over the last week. . She had run out of her albuterol, she did have a refill, but they were not aware. They have been using 3% saline. Pt. Husband endorses that they should have gone to the ED sooner, but they were hoping to get in to see immunology this week to resume her IVIG treatments.  EMS was called and patient has been transported to [facility] on oxygen with paramedic. She will need oxygen at discharge and she will also need a chest vest for mobilization of  secretions/ bronchiectasis. She need immunology to resume her IVIG infusions.   Test Results: Ig levels 12/22/19 >> IgA 51, IgG 289, IgM 27 (all low) PFT 10/24/21 >> FEV1 1.59 (72%), FEV1% 71, TLC 4.92 (100%), DLCO 60%, +BD CT chest 08/11/22 >> apical scarring, cylindrical BTX more in lower lobes, mucoid impaction and peribronchovascular nodularity in lower lobes Lt > Rt CT angio chest 08/31/22 >> debris in RLL bronchus, b/l lower lobe bronchial wall thickening with multifocal mucus plugging, scattered areas of GGO b/l, RLL area of consolidation   OV 12/18/2022  Subjective:  Patient ID: Kayla [name], female , DOB: 1950/04/14 , age 72 y.o. , MRN: [id number] , ADDRESS: [address] [place] [address] PCP [name], MD Patient Care Team: [name], MD as PCP - General (Internal Medicine) Christia Reading, MD (Otolaryngology) Odette Fraction, MD as Consulting Physician (Infectious Diseases) Tyron Russell, MD (Neurology) Livingston Diones, Feliberto Gottron, MD (Allergy and Immunology)  This Provider for this visit: Treatment Team:  Attending Provider: Kalman Shan, MD    12/18/2022 -   Chief Complaint  Patient presents with   Follow-up    Dx with PNA in May 2024 and she states there is still pna present on last cxr done 12/09/22.  She has prod cough with white sputum. Breathing is stable as long as she is using her supplemental o2.      HPI Kayla [name] 72 y.o. -returns for follow-up.  After my last visit with her she has seen Dr. Tonia Brooms who did a bronchoscopy.  She is colonized with pneumococcus.  She  Dr. Tonia Brooms, or Marchelle Gearing  next week in the [place] office with a CXR to ensure you are recovering from your pneumonia.  Call if you get worse not better. Consider repeat swallow eval. Please contact office for sooner follow up if symptoms do not improve or worsen or seek emergency care     xxxxxxxxxxxxxxxxxxxxxxxxxxxxxxxxxxxxxxxxxxxxxxxxxxxxxxxxxxxxxxxxxxxxxxxxxx   09/16/2022 Pt. Presents for hospital follow up. Upon arrival she was in respiratory distress.  Sats were  81% on RA .She was  audibly wheezing and coughing. She was gasping for air. She was using accessory muscles, nasal flaring was present, she had sternal retractions. She was not discharged with oxygen, and her sats have been very low( in the 70's)  for 24-48 hours per her husband. They have not been using albuterol nebs.  She was placed on oxygen 4L, sats initially responded and then dropped again. She is now on 6 L with sats of 96%. She was given a xopenex neb ( .63 mg/ 3cc's) She has a cough which is productive with brown secretions . She has been having chills for 7-8 days.  They have not checked her temperature. She did see her PCP last Tuesday and they had a discussion about Palliative care. I am concerned she has recurrent pneumonia. Breath sounds are diminished per right base and she is in bronchospasm.  She has been doing poorly over the last week. . She had run out of her albuterol, she did have a refill, but they were not aware. They have been using 3% saline. Pt. Husband endorses that they should have gone to the ED sooner, but they were hoping to get in to see immunology this week to resume her IVIG treatments.  EMS was called and patient has been transported to [facility] on oxygen with paramedic. She will need oxygen at discharge and she will also need a chest vest for mobilization of  secretions/ bronchiectasis. She need immunology to resume her IVIG infusions.   Test Results: Ig levels 12/22/19 >> IgA 51, IgG 289, IgM 27 (all low) PFT 10/24/21 >> FEV1 1.59 (72%), FEV1% 71, TLC 4.92 (100%), DLCO 60%, +BD CT chest 08/11/22 >> apical scarring, cylindrical BTX more in lower lobes, mucoid impaction and peribronchovascular nodularity in lower lobes Lt > Rt CT angio chest 08/31/22 >> debris in RLL bronchus, b/l lower lobe bronchial wall thickening with multifocal mucus plugging, scattered areas of GGO b/l, RLL area of consolidation   OV 12/18/2022  Subjective:  Patient ID: Kayla [name], female , DOB: 1950/04/14 , age 72 y.o. , MRN: [id number] , ADDRESS: [address] [place] [address] PCP [name], MD Patient Care Team: [name], MD as PCP - General (Internal Medicine) Christia Reading, MD (Otolaryngology) Odette Fraction, MD as Consulting Physician (Infectious Diseases) Tyron Russell, MD (Neurology) Livingston Diones, Feliberto Gottron, MD (Allergy and Immunology)  This Provider for this visit: Treatment Team:  Attending Provider: Kalman Shan, MD    12/18/2022 -   Chief Complaint  Patient presents with   Follow-up    Dx with PNA in May 2024 and she states there is still pna present on last cxr done 12/09/22.  She has prod cough with white sputum. Breathing is stable as long as she is using her supplemental o2.      HPI Kayla [name] 72 y.o. -returns for follow-up.  After my last visit with her she has seen Dr. Tonia Brooms who did a bronchoscopy.  She is colonized with pneumococcus.  She

## 2022-12-18 NOTE — Patient Instructions (Addendum)
ICD-10-CM   1. Chronic respiratory failure with hypoxia (HCC)  J96.11     2. Bilateral vocal cord paralysis  J38.02     3. Streptococcal pneumonia (HCC)  J15.4     4. Chronic cough  R05.3     5. Mixed simple and mucopurulent chronic bronchitis (HCC)  J41.8     6. MS (multiple sclerosis) (HCC)  G35      Overall stable.  Noted new diagnosis of Guillan Barr. Chest x-ray 12/09/2022 significantly improved compared to June 2024/August 2024 but does have a new right upper lobe infiltrate  Plan -Chest x-ray two-view today  -Might take 1 or 2 weeks for the results to come out - Discussed with infectious diseases   -Stop amoxicillin  -Take daily azithromycin 250 mg   -We will monitor for stomach cramps and also intermittent EKG for electrical issues and also hearing   = Hopefully this helps her cough -Continue oxygen 4 L nasal cannula - Continue flutter valve, Yupelri and other breathing treatments - Keep up appointment Dr. Barnie Alderman in ENT for vocal cord paralysis  Follow-up - 6 weeks video visit with nurse practitioner to reflect on azithromycin intake  - need perioidic EK  - needs EKG

## 2022-12-23 ENCOUNTER — Other Ambulatory Visit: Payer: Self-pay | Admitting: Internal Medicine

## 2022-12-23 DIAGNOSIS — N390 Urinary tract infection, site not specified: Secondary | ICD-10-CM

## 2022-12-23 DIAGNOSIS — D696 Thrombocytopenia, unspecified: Secondary | ICD-10-CM | POA: Diagnosis not present

## 2022-12-23 DIAGNOSIS — J479 Bronchiectasis, uncomplicated: Secondary | ICD-10-CM | POA: Diagnosis not present

## 2022-12-23 DIAGNOSIS — J440 Chronic obstructive pulmonary disease with acute lower respiratory infection: Secondary | ICD-10-CM | POA: Diagnosis not present

## 2022-12-23 DIAGNOSIS — G35 Multiple sclerosis: Secondary | ICD-10-CM | POA: Diagnosis not present

## 2022-12-23 DIAGNOSIS — J153 Pneumonia due to streptococcus, group B: Secondary | ICD-10-CM | POA: Diagnosis not present

## 2022-12-23 DIAGNOSIS — J9621 Acute and chronic respiratory failure with hypoxia: Secondary | ICD-10-CM | POA: Diagnosis not present

## 2022-12-24 DIAGNOSIS — N319 Neuromuscular dysfunction of bladder, unspecified: Secondary | ICD-10-CM | POA: Diagnosis not present

## 2022-12-24 DIAGNOSIS — F32A Depression, unspecified: Secondary | ICD-10-CM | POA: Diagnosis not present

## 2022-12-24 DIAGNOSIS — J153 Pneumonia due to streptococcus, group B: Secondary | ICD-10-CM | POA: Diagnosis not present

## 2022-12-24 DIAGNOSIS — Z792 Long term (current) use of antibiotics: Secondary | ICD-10-CM | POA: Diagnosis not present

## 2022-12-24 DIAGNOSIS — J479 Bronchiectasis, uncomplicated: Secondary | ICD-10-CM | POA: Diagnosis not present

## 2022-12-24 DIAGNOSIS — K219 Gastro-esophageal reflux disease without esophagitis: Secondary | ICD-10-CM | POA: Diagnosis not present

## 2022-12-24 DIAGNOSIS — R053 Chronic cough: Secondary | ICD-10-CM | POA: Diagnosis not present

## 2022-12-24 DIAGNOSIS — Z556 Problems related to health literacy: Secondary | ICD-10-CM | POA: Diagnosis not present

## 2022-12-24 DIAGNOSIS — G35 Multiple sclerosis: Secondary | ICD-10-CM | POA: Diagnosis not present

## 2022-12-24 DIAGNOSIS — E871 Hypo-osmolality and hyponatremia: Secondary | ICD-10-CM | POA: Diagnosis not present

## 2022-12-24 DIAGNOSIS — R32 Unspecified urinary incontinence: Secondary | ICD-10-CM | POA: Diagnosis not present

## 2022-12-24 DIAGNOSIS — E039 Hypothyroidism, unspecified: Secondary | ICD-10-CM | POA: Diagnosis not present

## 2022-12-24 DIAGNOSIS — K5900 Constipation, unspecified: Secondary | ICD-10-CM | POA: Diagnosis not present

## 2022-12-24 DIAGNOSIS — D696 Thrombocytopenia, unspecified: Secondary | ICD-10-CM | POA: Diagnosis not present

## 2022-12-24 DIAGNOSIS — D801 Nonfamilial hypogammaglobulinemia: Secondary | ICD-10-CM | POA: Diagnosis not present

## 2022-12-24 DIAGNOSIS — R339 Retention of urine, unspecified: Secondary | ICD-10-CM | POA: Diagnosis not present

## 2022-12-24 DIAGNOSIS — M1990 Unspecified osteoarthritis, unspecified site: Secondary | ICD-10-CM | POA: Diagnosis not present

## 2022-12-24 DIAGNOSIS — J440 Chronic obstructive pulmonary disease with acute lower respiratory infection: Secondary | ICD-10-CM | POA: Diagnosis not present

## 2022-12-24 DIAGNOSIS — J3802 Paralysis of vocal cords and larynx, bilateral: Secondary | ICD-10-CM | POA: Diagnosis not present

## 2022-12-24 DIAGNOSIS — J9621 Acute and chronic respiratory failure with hypoxia: Secondary | ICD-10-CM | POA: Diagnosis not present

## 2022-12-24 DIAGNOSIS — K449 Diaphragmatic hernia without obstruction or gangrene: Secondary | ICD-10-CM | POA: Diagnosis not present

## 2022-12-24 DIAGNOSIS — G4733 Obstructive sleep apnea (adult) (pediatric): Secondary | ICD-10-CM | POA: Diagnosis not present

## 2022-12-24 DIAGNOSIS — D649 Anemia, unspecified: Secondary | ICD-10-CM | POA: Diagnosis not present

## 2022-12-24 DIAGNOSIS — E441 Mild protein-calorie malnutrition: Secondary | ICD-10-CM | POA: Diagnosis not present

## 2022-12-24 DIAGNOSIS — G4700 Insomnia, unspecified: Secondary | ICD-10-CM | POA: Diagnosis not present

## 2022-12-24 DIAGNOSIS — Z9981 Dependence on supplemental oxygen: Secondary | ICD-10-CM | POA: Diagnosis not present

## 2022-12-24 DIAGNOSIS — M810 Age-related osteoporosis without current pathological fracture: Secondary | ICD-10-CM | POA: Diagnosis not present

## 2022-12-25 ENCOUNTER — Telehealth: Payer: Self-pay | Admitting: Internal Medicine

## 2022-12-25 NOTE — Telephone Encounter (Signed)
HH ORDERS   Caller Name: [facility] Agency Name: [facility] [employer] Phone #: [phone](secure)  Service Requested: nursing continued (examples: OT/PT/Skilled Nursing/Social Work/Speech Therapy/Wound Care)  Frequency of Visits: 1 week 1, every other week 4

## 2022-12-26 ENCOUNTER — Telehealth: Payer: Self-pay | Admitting: Allergy and Immunology

## 2022-12-26 NOTE — Telephone Encounter (Signed)
Kayla [name] (husband) called in upset (not with [place]) because he states [employer] has been calling [name] multiple times a day and threatening to take the vest back.  Kayla [name] is upset because he stated that the vest is helping.  [employer] told Kayla [name], that the doctor's office was supposed to sign a form and we are refusing to do so.  Kayla [name] feels they are using scare tactics and he is really frustrated.  I informed Kayla [name] that Dr. Lucie Leather and I have been working together on this issue for the past month or so.  I did explain that I have been sending them all of her medical records.  He said his anger and frustration is not with [place] at all and he appreciates everything Dr. Lucie Leather has done for [place].  Kayla [name] wants to know if there is anything on his end he can do to make the phone calls stop.  I informed Kayla [name] I would send a message to Dr. Lucie Leather but Dr. Lucie Leather wouldn't get it until Monday and he stated that was completely fine with him.

## 2022-12-30 NOTE — Telephone Encounter (Unsigned)
Per provider:  Please inform frank that we have asked the company to submit the paperwork to insurance but they will not do so even though we have sent them information multiple times. Lets do the following:  1. Lets find out what company Pulmonary uses for percussion vest and have that company provide her with a percussion vest.  2. After providing her with a percussion vest, then we can let [employer] that they can pick up the vest.   Called [facility] - DOB verified - spoke to Selena Batten, Patient Associate/Front Office, advised of the above provider notation.  Selena Batten stated they use [employer] - [phone] - spoke to Brandi, Intake Specialist - DOB verified - stated to send the following for them to process for percussion vest/afflo vest:  Provider order Patient demographies  Copy of patient insurance Office Notes  Fax to [phone] Attn: [facility]  Faxed above information to have processed for patient to receive a percussion/afflo vest.  Called patient - DOB/NEED updated DPR  verified - spoke to her spouse Homero Fellers advised of above notation.  Homero Fellers verbalized understanding to all, no further questions.  Forwarding updated to message to provider.

## 2022-12-31 ENCOUNTER — Telehealth: Payer: Self-pay | Admitting: Internal Medicine

## 2022-12-31 NOTE — Telephone Encounter (Signed)
HH ORDERS   Caller Name: [facility] Agency Name: [facility] [employer] Phone #: [phone](secure)  Service Requested: nursing (examples: OT/PT/Skilled Nursing/Social Work/Speech Therapy/Wound Care)  Frequency of Visits: 1X every 2 weeks for 6 weeks

## 2022-12-31 NOTE — Telephone Encounter (Signed)
Verbal order given to [facility] as requested.

## 2023-01-01 DIAGNOSIS — G629 Polyneuropathy, unspecified: Secondary | ICD-10-CM | POA: Diagnosis not present

## 2023-01-01 DIAGNOSIS — G35 Multiple sclerosis: Secondary | ICD-10-CM | POA: Diagnosis not present

## 2023-01-06 ENCOUNTER — Telehealth: Payer: Self-pay | Admitting: Internal Medicine

## 2023-01-06 DIAGNOSIS — J9621 Acute and chronic respiratory failure with hypoxia: Secondary | ICD-10-CM | POA: Diagnosis not present

## 2023-01-06 DIAGNOSIS — G35 Multiple sclerosis: Secondary | ICD-10-CM | POA: Diagnosis not present

## 2023-01-06 DIAGNOSIS — J153 Pneumonia due to streptococcus, group B: Secondary | ICD-10-CM | POA: Diagnosis not present

## 2023-01-06 DIAGNOSIS — D696 Thrombocytopenia, unspecified: Secondary | ICD-10-CM | POA: Diagnosis not present

## 2023-01-06 DIAGNOSIS — J479 Bronchiectasis, uncomplicated: Secondary | ICD-10-CM | POA: Diagnosis not present

## 2023-01-06 DIAGNOSIS — J440 Chronic obstructive pulmonary disease with acute lower respiratory infection: Secondary | ICD-10-CM | POA: Diagnosis not present

## 2023-01-06 NOTE — Telephone Encounter (Signed)
Nykeisha with [facility] called asking if a speech evaluation could be sent in for patient, states it can be verbal orders or can fax the orders to [facility] at [phone]. Best callback is [phone].

## 2023-01-07 ENCOUNTER — Other Ambulatory Visit (HOSPITAL_COMMUNITY): Payer: Self-pay

## 2023-01-07 NOTE — Telephone Encounter (Signed)
Verbal order given for speech eval.

## 2023-01-08 ENCOUNTER — Telehealth: Payer: Self-pay

## 2023-01-08 NOTE — Patient Outreach (Signed)
Care Coordination   01/08/2023 Name: YOHANA TASKER MRN: [id number] DOB: 09/14/1950   Care Coordination Outreach Attempts:  An unsuccessful telephone outreach was attempted for a scheduled appointment today.  Follow Up Plan:  Additional outreach attempts will be made to offer the patient care coordination information and services.   Encounter Outcome:  No Answer   Care Coordination Interventions:  No, not indicated    Kathyrn Sheriff, RN, MSN, BSN, CCM Care Management Coordinator [phone]

## 2023-01-12 ENCOUNTER — Other Ambulatory Visit: Payer: Self-pay

## 2023-01-12 ENCOUNTER — Encounter: Payer: Self-pay | Admitting: Internal Medicine

## 2023-01-12 MED ORDER — LEVOTHYROXINE SODIUM 100 MCG PO TABS: ORAL_TABLET | ORAL | 3 refills | Status: DC

## 2023-01-12 NOTE — Telephone Encounter (Signed)
Medication has been sent to her local pharmacy. Patient is aware

## 2023-01-14 ENCOUNTER — Encounter: Payer: Self-pay | Admitting: Internal Medicine

## 2023-01-14 DIAGNOSIS — D696 Thrombocytopenia, unspecified: Secondary | ICD-10-CM | POA: Diagnosis not present

## 2023-01-14 DIAGNOSIS — J9621 Acute and chronic respiratory failure with hypoxia: Secondary | ICD-10-CM | POA: Diagnosis not present

## 2023-01-14 DIAGNOSIS — G35 Multiple sclerosis: Secondary | ICD-10-CM | POA: Diagnosis not present

## 2023-01-14 DIAGNOSIS — J440 Chronic obstructive pulmonary disease with acute lower respiratory infection: Secondary | ICD-10-CM | POA: Diagnosis not present

## 2023-01-14 DIAGNOSIS — J479 Bronchiectasis, uncomplicated: Secondary | ICD-10-CM | POA: Diagnosis not present

## 2023-01-14 DIAGNOSIS — J9611 Chronic respiratory failure with hypoxia: Secondary | ICD-10-CM

## 2023-01-14 DIAGNOSIS — J153 Pneumonia due to streptococcus, group B: Secondary | ICD-10-CM | POA: Diagnosis not present

## 2023-01-16 NOTE — Telephone Encounter (Signed)
Ok to return

## 2023-01-19 DIAGNOSIS — J9621 Acute and chronic respiratory failure with hypoxia: Secondary | ICD-10-CM | POA: Diagnosis not present

## 2023-01-19 DIAGNOSIS — J440 Chronic obstructive pulmonary disease with acute lower respiratory infection: Secondary | ICD-10-CM | POA: Diagnosis not present

## 2023-01-19 DIAGNOSIS — D696 Thrombocytopenia, unspecified: Secondary | ICD-10-CM | POA: Diagnosis not present

## 2023-01-19 DIAGNOSIS — J153 Pneumonia due to streptococcus, group B: Secondary | ICD-10-CM | POA: Diagnosis not present

## 2023-01-19 DIAGNOSIS — J479 Bronchiectasis, uncomplicated: Secondary | ICD-10-CM | POA: Diagnosis not present

## 2023-01-19 DIAGNOSIS — G35 Multiple sclerosis: Secondary | ICD-10-CM | POA: Diagnosis not present

## 2023-01-23 ENCOUNTER — Encounter: Payer: Self-pay | Admitting: Internal Medicine

## 2023-01-27 ENCOUNTER — Ambulatory Visit: Payer: Self-pay

## 2023-01-27 ENCOUNTER — Telehealth: Payer: Self-pay

## 2023-01-27 ENCOUNTER — Other Ambulatory Visit: Payer: Self-pay | Admitting: Internal Medicine

## 2023-01-27 DIAGNOSIS — F5104 Psychophysiologic insomnia: Secondary | ICD-10-CM

## 2023-01-27 DIAGNOSIS — G35 Multiple sclerosis: Secondary | ICD-10-CM

## 2023-01-27 NOTE — Patient Outreach (Signed)
Care Coordination   Follow Up Visit Note   01/27/2023 Name: Kayla [name] MRN: [id number] DOB: Jun 23, 1950  Kayla [name] is a 72 y.o. year old female who sees Kayla Grandchild, MD for primary care. I spoke with  Kayla [name] by phone today.  What matters to the patients health and wellness today?  Per Kayla [name], patient is improved since discharge from hospital. However continues to have chronic cough. He states patient has an appointment with pulmonology, Dr. Clent [name] on 02/03/23. Per Kayla [name], patient has an upcoming appointment with [facility], Dr.  Purcell [name] next Thursday. She remains on continuous oxygen at 3.5-4L/[place]. Kayla [name] is receiving self infusion IVIG for Kayla [name] once a week. And is awaiting a call from [facility] Physical therapy to schedule start of care date. Kayla [name] reports patient's appetite is improved and she has gained a little weight. Office visit with Dr. Delford [name], ENT on 12/24/22 re: vocal cord paralysis- per Kayla [name] ENT discussed tracheostomy as treatment option. Reports currently attended scheduled appointments, taking medications as prescribed and awaiting start of physical therapy.  Goals Addressed             This Visit's Progress    Assistance with health management       Interventions Today    Flowsheet Row Most Recent Value  Chronic Disease   Chronic disease during today's visit Other, Chronic Obstructive Pulmonary Disease (COPD)  [Kayla [name], chronic respiratory failure, pneumonia. per spouse Kayla [name],]  General Interventions   General Interventions Discussed/Reviewed General Interventions Reviewed, Doctor Visits  [Evaluation of current treatment plan for health condition and patient's adherence to plan.]  Doctor Visits Discussed/Reviewed Doctor Visits Discussed, Doctor Visits Reviewed  [reviewed upcoming scheduled appointments]  Education Interventions   Education Provided Provided Education  Provided Verbal Education On  Medication, When to see the doctor, Nutrition, Exercise  [encouraged to continue to take medications as prescribed,  attend provider visits as scheduled,  eat healthy, work with physical therapist as recommended]  Nutrition Interventions   Nutrition Discussed/Reviewed Nutrition Discussed  Pharmacy Interventions   Pharmacy Dicussed/Reviewed Pharmacy Topics Reviewed  [medication review completed]  Safety Interventions   Safety Discussed/Reviewed Safety Discussed           COMPLETED: Care coordination-post hospitalization       Interventions Today    Flowsheet Row Most Recent Value  Chronic Disease   Chronic disease during today's visit Other, Chronic Obstructive Pulmonary Disease (COPD)  [chronic hypoxic respiratory failure/ Kayla [name] /CVID]  General Interventions   General Interventions Discussed/Reviewed General Interventions Reviewed, Doctor Visits  Doctor Visits Discussed/Reviewed Doctor Visits Reviewed, Doctor Visits Discussed, PCP, Specialist  [encouraged to follow up with pulmonary as previously recommended.]  PCP/Specialist Visits Compliance with follow-up visit  [reviewed upcoming appointments and encouraged to attend as recommended/scheduled]  Exercise Interventions   Exercise Discussed/Reviewed Exercise Reviewed  [confirmed remains active with home health]  Education Interventions   Education Provided Provided Education  Provided Verbal Education On Other, Medication, When to see the doctor  [advised to attend provider visits as scheduled, take medications as prescribed,contact provider with health questions or concerns.]  Pharmacy Interventions   Pharmacy Dicussed/Reviewed Pharmacy Topics Reviewed  Safety Interventions   Safety Discussed/Reviewed Fall Risk, Safety Reviewed  [discussed fall prevention strategies]            SDOH assessments and interventions completed:  No  Care Coordination Interventions:  Yes, provided   Follow up plan: Follow up call scheduled for 02/26/23  Encounter Outcome:  Patient Visit Completed   Kathyrn Sheriff, RN, MSN, BSN, CCM Care Management Coordinator [phone]

## 2023-01-27 NOTE — Patient Instructions (Signed)
Visit Information  Thank you for taking time to visit with me today. Please don't hesitate to contact me if I can be of assistance to you.   Following are the goals we discussed today:  Continue to take medications as prescribed. Continue to attend provider visits as scheduled Continue to eat healthy, lean meats, vegetables, fruits, avoid saturated and transfats Contact provider with health questions or concerns as needed   Our next appointment is by telephone on 02/26/23 at 2:00 pm  Please call the care guide team at [phone] if you need to cancel or reschedule your appointment.   If you are experiencing a Mental Health or Behavioral Health Crisis or need someone to talk to, please call the Suicide and Crisis Lifeline: 988 call the [place] National Suicide Prevention Lifeline: 571-091-8746 or TTY: (414)684-4478 TTY 947-471-9578) to talk to a trained counselor call 1-800-273-TALK (toll free, 24 hour hotline)  Kathyrn Sheriff, RN, MSN, BSN, CCM Care Management Coordinator [phone]

## 2023-01-27 NOTE — Patient Outreach (Signed)
  Care Coordination   01/27/2023 Name: SAYLER MICKIEWICZ MRN: [id number] DOB: 1951-03-19   Care Coordination Outreach Attempts:  An unsuccessful telephone outreach was attempted for a scheduled appointment today.  Follow Up Plan:  Additional outreach attempts will be made to offer the patient care coordination information and services.   Encounter Outcome:  No Answer   Care Coordination Interventions:  No, not indicated    Kathyrn Sheriff, RN, MSN, BSN, CCM Care Management Coordinator [phone]

## 2023-02-02 DIAGNOSIS — R260 Ataxic gait: Secondary | ICD-10-CM | POA: Diagnosis not present

## 2023-02-02 DIAGNOSIS — S42211D Unspecified displaced fracture of surgical neck of right humerus, subsequent encounter for fracture with routine healing: Secondary | ICD-10-CM | POA: Diagnosis not present

## 2023-02-02 DIAGNOSIS — G35 Multiple sclerosis: Secondary | ICD-10-CM | POA: Diagnosis not present

## 2023-02-02 DIAGNOSIS — R296 Repeated falls: Secondary | ICD-10-CM | POA: Diagnosis not present

## 2023-02-03 ENCOUNTER — Ambulatory Visit: Payer: Medicare Other

## 2023-02-03 ENCOUNTER — Ambulatory Visit (INDEPENDENT_AMBULATORY_CARE_PROVIDER_SITE_OTHER): Payer: Medicare Other | Admitting: Primary Care

## 2023-02-03 ENCOUNTER — Encounter: Payer: Self-pay | Admitting: Primary Care

## 2023-02-03 VITALS — BP 112/60 | HR 72 | Ht 63.0 in | Wt 110.0 lb

## 2023-02-03 DIAGNOSIS — J9811 Atelectasis: Secondary | ICD-10-CM | POA: Diagnosis not present

## 2023-02-03 DIAGNOSIS — R053 Chronic cough: Secondary | ICD-10-CM

## 2023-02-03 DIAGNOSIS — J189 Pneumonia, unspecified organism: Secondary | ICD-10-CM

## 2023-02-03 DIAGNOSIS — J3802 Paralysis of vocal cords and larynx, bilateral: Secondary | ICD-10-CM

## 2023-02-03 DIAGNOSIS — R918 Other nonspecific abnormal finding of lung field: Secondary | ICD-10-CM | POA: Diagnosis not present

## 2023-02-03 MED ORDER — PROMETHAZINE-DM 6.25-15 MG/5ML PO SYRP: ORAL_SOLUTION | ORAL | 0 refills | Status: DC

## 2023-02-03 MED ORDER — AZELASTINE HCL 0.1 % NA SOLN: 1.0000 | Freq: Two times a day (BID) | NASAL | 1 refills | Status: DC

## 2023-02-03 NOTE — Progress Notes (Signed)
Please let patient know CXR showed unchanged patchy opacities right lung. I will order CT of her chest. She clinically is better, holding off on additional abx course. Continue daily azithromycin.

## 2023-02-03 NOTE — Patient Instructions (Signed)
VISIT SUMMARY:  During today's visit, we discussed your ongoing issues with pneumonia, chronic cough, vocal cord paralysis, and swelling in your legs. We reviewed your current treatments and made some adjustments to help improve your symptoms.  YOUR PLAN: -PNEUMONIA: Pneumonia is an infection that inflames the air sacs in one or both lungs. You will continue taking Azithromycin 250mg  daily, and we will do a repeat chest X-ray today. If the pneumonia is not resolved, we will order a CT scan.  -CHRONIC COUGH: A chronic cough is a cough that lasts for a long time, often due to underlying conditions. We will try Promethazine DM as a new cough suppressant and consider using Astelin nasal spray for potential postnasal drip. Continue with your current management strategies.   -VOCAL CORD PARALYSIS: Vocal cord paralysis is a condition where the vocal cords cannot move properly, leading to issues like mucus pooling and chronic cough. Continue with your current management and follow up with Dr. Delford Field as scheduled. Please let him know Gabapentin did not help your cough.  -PEDAL EDEMA: Pedal edema is swelling in the lower legs and feet. Try using compression stockings during the day if you can manage them, and continue to elevate your legs.   Orders: CXR today   Follow-up 3 months with Dr. Debroah Baller

## 2023-02-03 NOTE — Progress Notes (Addendum)
@Patient  ID: Kayla [name], female    DOB: [birth year], 71 y.o.   MRN: [id number]  Chief Complaint  Patient presents with   Follow-up    Referring provider: [name], MD  HPI: 72 year old female, never smoked.  Past medical history significant for bilateral vocal cord paralysis, aspiration pneumonia, chronic obstructive pulmonary disease, intrinsic asthma, OSA on CPAP, multiple sclerosis, deficiency anemia, protein calorie malnutrition. Patient of Dr. Marchelle Gearing.  02/03/2023 Discussed the use of AI scribe software for clinical note transcription with the patient, who gave verbal consent to proceed.  History of Present Illness   The patient, with a history of chronic cough and pneumonia, has been on daily azithromycin for the past 4-6 weeks. She reports a slight improvement in her condition, but is wanting to know if her pneumonia has resolved. [name] chronic cough is complicated by a partially paralyzed voice box, leading to pooling of mucus and irritation, triggering the cough. The patient has been recommended a tracheostomy for management of the chronic cough, but has been delaying the procedure due to personal reasons.  The patient has tried various treatments for the cough, including Tessalon pearls, saline solution, portable nebulizer, cough drops, and swallowing water frequently. She has also been on gabapentin 100mg  three times a day, prescribed by her doctor, but it has not shown any improvement in her condition. The patient has a history of MS, for which she used to take gabapentin, but stopped about three years ago due to lack of pain.  The patient has been on reflux medication, Dexilant and Pepcid, and has tried Mucinex for mucus management. She is also on nebulizers, including famoterol or Perforomist twice a day, Yupelri once a day, and Albuterol and Pulmicort twice a day. The patient has tried prednisone in the past, but it did not show any improvement.  The  patient has been experiencing swelling in the legs, which she manages by elevating her legs throughout the day. She has tried compression stockings in the past, but has difficulty putting them on. The patient has also been experiencing numbness in the bottom of her feet, which her surgeon suspects might be due to Guillain-Barre Syndrome (GBS). The patient has been in a wheelchair until recently, and has started walking slowly with the help of a walker and physical therapy.         Allergies  Allergen Reactions   Tramadol Other (See Comments)    Hallucinations    Contrast Media [Iodinated Contrast Media] Rash   Iohexol Hives   Morphine And Codeine Other (See Comments)    Hallucinations    Acetaminophen    Methylprednisolone Other (See Comments)    Decreases pts heart rate    Oxycodone    Oxycodone Hcl    Tizanidine Hcl    Hydrocodone Itching   Oxycodone-Acetaminophen Rash and Other (See Comments)    Hallucinations, also   Phenytoin Sodium Extended Rash   Zanaflex [Tizanidine Hydrochloride] Rash    Immunization History  Administered Date(s) Administered   DTP 04/10/2009   Fluad Quad(high Dose 65+) 12/29/2018, 01/29/2022   Fluad Trivalent(High Dose 65+) 12/09/2022   Influenza Split 12/10/2010, 02/11/2012, 12/22/2012, 01/02/2014, 12/22/2014   Influenza Whole 02/19/2005, 03/28/2009, 01/23/2016   Influenza, High Dose Seasonal PF 02/23/2018   Influenza,inj,Quad PF,6+ Mos 02/10/2017   Influenza-Unspecified 12/02/2018, 01/08/2019, 12/22/2019, 01/15/2021, 02/05/2021   Moderna Covid-19 Vaccine Bivalent Booster 38yrs & up 02/05/2021, 01/20/2022   Moderna Sars-Covid-2 Vaccination 04/29/2019, 06/01/2019, 01/19/2020, 08/14/2020   PNEUMOCOCCAL CONJUGATE-20 10/22/2022  Pfizer(Comirnaty)Fall Seasonal Vaccine 12 years and older 09/23/2022   Pneumococcal Conjugate-13 11/06/2015   Pneumococcal Polysaccharide-23 03/28/2009, 04/26/2019   Respiratory Syncytial Virus Vaccine,Recomb  Aduvanted(Arexvy) 12/26/2021   Tdap 04/26/2019, 06/19/2022   Zoster Recombinant(Shingrix) 09/07/2019, 02/17/2020    Past Medical History:  Diagnosis Date   Arthritis    oa   Asthma    Bilateral vocal cord paralysis    Bronchiectasis (HCC)    Cervical cancer (HCC) 2005   Depression    GERD (gastroesophageal reflux disease)    Hiatal hernia    Hypothyroidism    Immunodeficiency (HCC)    Gets IVIG infusions   Insomnia    Multiple sclerosis (HCC) 1996   Osteoporosis    Pneumonia 2024, 2014, 1992   Urine retention     Tobacco History: Social History   Tobacco Use  Smoking Status Never  Smokeless Tobacco Never   Counseling given: Not Answered   Outpatient Medications Prior to Visit  Medication Sig Dispense Refill   albuterol (PROVENTIL) (2.5 MG/3ML) 0.083% nebulizer solution Inhale 3 mLs (2.5 mg total) by nebulization every 4 (four) hours as needed for wheezing or shortness of breath. (Patient taking differently: Take 2.5 mg by nebulization See admin instructions. 2.5 mg via nebulization in the morning, midday, and evening) 75 mL 2   albuterol (PROVENTIL) (2.5 MG/3ML) 0.083% nebulizer solution Use 1 vial in nebulizer every 4-6 hours as needed for shortness of breath and wheezing 180 mL 1   azithromycin (ZITHROMAX) 250 MG tablet Take 1 tablet (250 mg total) by mouth daily. 30 tablet 5   baclofen (LIORESAL) 10 MG tablet Take 10 mg by mouth in the morning.     benzonatate (TESSALON PERLES) 100 MG capsule Take 1 capsule (100 mg total) by mouth 3 (three) times daily as needed for cough. 270 capsule 0   bisacodyl (DULCOLAX) 5 MG EC tablet Take 5 mg by mouth See admin instructions. Take 5 mg by mouth in the morning and evening     budesonide (PULMICORT) 0.5 MG/2ML nebulizer solution Use one vial in the nebulizer twice daily (Patient taking differently: Take 0.5 mg by nebulization See admin instructions. 0.5 mg via nebulization in the morning and evening and swish/gargle/swallow 5 ml's  of Nystatin as directed afterwards) 120 mL 5   calcium carbonate (OS-CAL - DOSED IN MG OF ELEMENTAL CALCIUM) 1250 (500 Ca) MG tablet Take 1 tablet (1,250 mg total) by mouth daily with breakfast.     Coenzyme Q10 (VITALINE COQ10) 300 MG WAFR Take 300 mg by mouth in the morning.     dexlansoprazole (DEXILANT) 60 MG capsule TAKE 1 CAPSULE BY MOUTH ONCE DAILY WITH BREAKFAST (Patient taking differently: Take 60 mg by mouth in the morning.) 90 capsule 0   famotidine (PEPCID) 20 MG tablet One after supper (Patient taking differently: Take 20 mg by mouth every evening.) 30 tablet 11   Ferric Maltol (ACCRUFER) 30 MG CAPS Take 1 capsule (30 mg total) by mouth in the morning and at bedtime. (Patient taking differently: Take 30 mg by mouth See admin instructions. Take 30 mg by mouth in the morning and evening) 180 capsule 0   FLUoxetine (PROZAC) 40 MG capsule Take 1 capsule (40 mg total) by mouth daily. (Patient taking differently: Take 40 mg by mouth in the morning.) 90 capsule 1   formoterol (PERFOROMIST) 20 MCG/2ML nebulizer solution Use one vial in the nebulizer twice daily (Patient taking differently: Take 20 mcg by nebulization See admin instructions. 20 mcg via nebulization in  the morning and evening) 120 mL 5   guaiFENesin (MUCINEX) 600 MG 12 hr tablet Take 1 tablet (600 mg total) by mouth 2 (two) times daily. (Patient taking differently: Take 600 mg by mouth See admin instructions. Take 600 mg by mouth in the morning and evening) 30 tablet 0   levothyroxine (SYNTHROID) 100 MCG tablet TAKE 1 TABLET BY MOUTH ONCE DAILY BEFORE BREAKFAST 90 tablet 3   MAGNESIUM PO Take 1 tablet by mouth every evening.     methenamine (HIPREX) 1 g tablet Take 1 tablet (1 g total) by mouth 2 (two) times daily with a meal. 180 tablet 0   mirabegron ER (MYRBETRIQ) 50 MG TB24 tablet Take 50 mg by mouth in the morning.     mirtazapine (REMERON) 15 MG tablet Take 1 tablet (15 mg total) by mouth at bedtime. 90 tablet 1   NON  FORMULARY Take 1 tablet by mouth See admin instructions. [employer] Mature Multivitamin with Calcium- Take 1 tablet by mouth with breakfast in the morning     NON FORMULARY Take 1 capsule by mouth See admin instructions. [employer] probiotic advanced  digestive probiotic- Take 1 capsule by mouth in the morning     nystatin (MYCOSTATIN) 100000 UNIT/ML suspension Swish, gargle, and swallow 5 mL after budesonide nebulizer treatment. 300 mL 5   OXYGEN Inhale 2-3.5 L/min into the lungs continuous.     PRESCRIPTION MEDICATION Apply 1 Device topically See admin instructions. Afflovest- For 30 minutes in the morning and evening- to begin @ 60% and work up to 80%     Respiratory Therapy Supplies (FLUTTER) DEVI Use as directed (Patient taking differently: Inhale 1 Device into the lungs as directed.) 1 each 0   revefenacin (YUPELRI) 175 MCG/3ML nebulizer solution Use one vial in the nebulizer once daily 90 mL 5   sodium chloride HYPERTONIC 3 % nebulizer solution Take by nebulization 2 (two) times daily as needed for cough. Dx J44.9 750 mL 1   tamsulosin (FLOMAX) 0.4 MG CAPS capsule Take 0.4 mg by mouth in the morning.     temazepam (RESTORIL) 15 MG capsule Take 1 capsule (15 mg total) by mouth every evening. 90 capsule 0   torsemide (DEMADEX) 10 MG tablet Take 1 tablet (10 mg total) by mouth daily. (Patient taking differently: Take 10 mg by mouth in the morning.) 90 tablet 0   amoxicillin-clavulanate (AUGMENTIN) 875-125 MG tablet Take 1 tablet by mouth twice daily till 8/10, then take 1 tablet by mouth daily indefinitely. 60 tablet 3   No facility-administered medications prior to visit.      Review of Systems  Review of Systems  Constitutional:  Negative for unexpected weight change.  HENT:  Positive for congestion. Negative for postnasal drip.   Respiratory:  Positive for cough and wheezing.   Cardiovascular:  Positive for leg swelling.     Physical Exam  BP 112/60 (BP Location: Right Arm,  Cuff Size: Normal)   Pulse 72   Ht 5\' 3"  (1.6 m)   Wt 110 lb (49.9 kg)   SpO2 100%   BMI 19.49 kg/m  Physical Exam Constitutional:      Appearance: Normal appearance.     Comments: Underweight; Chronically ill   Cardiovascular:     Comments: Pedal edema  Pulmonary:     Breath sounds: No wheezing, rhonchi or rales.     Comments: Upper airway wheeze/throat clearing cough Musculoskeletal:     Comments: Amb with walker  Neurological:     General:  No focal deficit present.     Mental Status: She is alert and oriented to person, place, and time. Mental status is at baseline.  Psychiatric:        Mood and Affect: Mood normal.        Behavior: Behavior normal.        Thought Content: Thought content normal.        Judgment: Judgment normal.      Lab Results:  CBC    Component Value Date/Time   WBC 8.3 11/26/2022 1427   RBC 3.46 (L) 11/26/2022 1427   HGB 9.8 (L) 11/26/2022 1427   HCT 30.3 (L) 11/26/2022 1427   PLT 427 (H) 11/26/2022 1427   MCV 87.6 11/26/2022 1427   MCH 28.3 11/26/2022 1427   MCHC 32.3 11/26/2022 1427   RDW 15.1 (H) 11/26/2022 1427   LYMPHSABS 2,341 11/26/2022 1427   MONOABS 1.4 (H) 10/30/2022 1505   EOSABS 108 11/26/2022 1427   BASOSABS 42 11/26/2022 1427    BMET    Component Value Date/Time   NA 137 11/26/2022 1427   K 3.9 11/26/2022 1427   CL 96 (L) 11/26/2022 1427   CO2 30 11/26/2022 1427   GLUCOSE 124 (H) 11/26/2022 1427   BUN 18 11/26/2022 1427   CREATININE 0.52 (L) 11/26/2022 1427   CALCIUM 9.1 11/26/2022 1427   GFRNONAA >60 10/23/2022 0547   GFRAA >60 06/08/2017 0704    BNP    Component Value Date/Time   BNP 106.3 (H) 08/31/2022 1703    ProBNP    Component Value Date/Time   PROBNP 112.0 (H) 01/14/2022 0824    Imaging: No results found.   Assessment & Plan:   1. Recurrent pneumonia - DG Chest 2 View; Future - CT CHEST HIGH RESOLUTION; Future  2. Chronic cough  3. Bilateral vocal cord  paralysis  Pneumonia Chronic, ongoing pneumonia with slight improvement on Azithromycin 250mg  daily. Chest X-ray in September showed right upper lobe opacity slightly improved with residual changes, underlying diffuse chronic lung changes. -Continue Azithromycin 250mg  daily. -Order repeat chest X-ray today. -If pneumonia is not resolved, order HRCT scan.  Chronic Cough Chronic productive cough for the past two years complicated by VCP and recurrent pneumonia. Cough is associated with mucus production. Current management includes nebulizers, cough drops, and swallowing techniques. Gabapentin 100mg  TID and tessalon perles did not provide relief. The patient has been using a flutter valve since November of 2023. The flutter valve is no longer mobilizing secretions and is not effective. The patient has been presenting with a daily productive cough that has been going on since February of this year  - Try Promethazine DM 2.5-49ml QID as a new cough suppressant. - Start Astelin nasal spray twice daily for potential postnasal drip. - Continue Pulmicort, Perforomist, Yupelri and prn Albuterol as prescribed - Continue Mucinex, hypertonic saline neb and flutter valve to loosen congestion  - Advised patient to notify Dr. Delford Field that gabapentin did not help cough   Vocal Cord Paralysis Chronic condition causing pooling of mucus and triggering cough. Tracheostomy has been recommended by ENT specialist, Dr. Delford Field, but patient is delaying due to personal reasons. -Continue current management and follow up with Dr. Delford Field as scheduled.  Pedal Edema Chronic pedal edema managed with leg elevation. -Try using compression stockings during the day, if manageable. -Continue leg elevation.  Follow-up in 3 months or as needed with Dr. Ronaldo Miyamoto, NP 02/03/2023

## 2023-02-04 DIAGNOSIS — S42211D Unspecified displaced fracture of surgical neck of right humerus, subsequent encounter for fracture with routine healing: Secondary | ICD-10-CM | POA: Diagnosis not present

## 2023-02-04 DIAGNOSIS — R260 Ataxic gait: Secondary | ICD-10-CM | POA: Diagnosis not present

## 2023-02-04 DIAGNOSIS — R296 Repeated falls: Secondary | ICD-10-CM | POA: Diagnosis not present

## 2023-02-04 DIAGNOSIS — G35 Multiple sclerosis: Secondary | ICD-10-CM | POA: Diagnosis not present

## 2023-02-05 DIAGNOSIS — D849 Immunodeficiency, unspecified: Secondary | ICD-10-CM | POA: Diagnosis not present

## 2023-02-05 DIAGNOSIS — J449 Chronic obstructive pulmonary disease, unspecified: Secondary | ICD-10-CM | POA: Diagnosis not present

## 2023-02-05 DIAGNOSIS — G35 Multiple sclerosis: Secondary | ICD-10-CM | POA: Diagnosis not present

## 2023-02-05 DIAGNOSIS — G379 Demyelinating disease of central nervous system, unspecified: Secondary | ICD-10-CM | POA: Diagnosis not present

## 2023-02-06 ENCOUNTER — Other Ambulatory Visit: Payer: Self-pay | Admitting: Allergy and Immunology

## 2023-02-07 ENCOUNTER — Other Ambulatory Visit: Payer: Self-pay | Admitting: Internal Medicine

## 2023-02-07 DIAGNOSIS — R600 Localized edema: Secondary | ICD-10-CM

## 2023-02-09 ENCOUNTER — Other Ambulatory Visit (HOSPITAL_COMMUNITY): Payer: Self-pay

## 2023-02-10 ENCOUNTER — Other Ambulatory Visit: Payer: Self-pay

## 2023-02-10 ENCOUNTER — Other Ambulatory Visit (HOSPITAL_COMMUNITY): Payer: Self-pay

## 2023-02-12 DIAGNOSIS — S42211D Unspecified displaced fracture of surgical neck of right humerus, subsequent encounter for fracture with routine healing: Secondary | ICD-10-CM | POA: Diagnosis not present

## 2023-02-12 DIAGNOSIS — R260 Ataxic gait: Secondary | ICD-10-CM | POA: Diagnosis not present

## 2023-02-12 DIAGNOSIS — G35 Multiple sclerosis: Secondary | ICD-10-CM | POA: Diagnosis not present

## 2023-02-12 DIAGNOSIS — R296 Repeated falls: Secondary | ICD-10-CM | POA: Diagnosis not present

## 2023-02-13 ENCOUNTER — Ambulatory Visit (HOSPITAL_COMMUNITY)
Admission: RE | Admit: 2023-02-13 | Discharge: 2023-02-13 | Disposition: A | Discharge status: Home / Self Care | Payer: Medicare Other | Source: Ambulatory Visit | Attending: Primary Care

## 2023-02-13 DIAGNOSIS — J189 Pneumonia, unspecified organism: Secondary | ICD-10-CM | POA: Diagnosis not present

## 2023-02-13 DIAGNOSIS — R053 Chronic cough: Secondary | ICD-10-CM

## 2023-02-13 DIAGNOSIS — J3802 Paralysis of vocal cords and larynx, bilateral: Secondary | ICD-10-CM

## 2023-02-13 DIAGNOSIS — R918 Other nonspecific abnormal finding of lung field: Secondary | ICD-10-CM | POA: Diagnosis not present

## 2023-02-13 DIAGNOSIS — J479 Bronchiectasis, uncomplicated: Secondary | ICD-10-CM | POA: Diagnosis not present

## 2023-02-16 DIAGNOSIS — R260 Ataxic gait: Secondary | ICD-10-CM | POA: Diagnosis not present

## 2023-02-16 DIAGNOSIS — R296 Repeated falls: Secondary | ICD-10-CM | POA: Diagnosis not present

## 2023-02-16 DIAGNOSIS — S42211D Unspecified displaced fracture of surgical neck of right humerus, subsequent encounter for fracture with routine healing: Secondary | ICD-10-CM | POA: Diagnosis not present

## 2023-02-16 DIAGNOSIS — G35 Multiple sclerosis: Secondary | ICD-10-CM | POA: Diagnosis not present

## 2023-02-17 ENCOUNTER — Encounter: Payer: Self-pay | Admitting: Allergy and Immunology

## 2023-02-17 ENCOUNTER — Other Ambulatory Visit: Payer: Self-pay

## 2023-02-17 ENCOUNTER — Ambulatory Visit (INDEPENDENT_AMBULATORY_CARE_PROVIDER_SITE_OTHER): Payer: Medicare Other | Admitting: Allergy and Immunology

## 2023-02-17 VITALS — BP 100/62 | HR 75 | Temp 97.8°F | Ht 63.0 in | Wt 109.3 lb

## 2023-02-17 DIAGNOSIS — K219 Gastro-esophageal reflux disease without esophagitis: Secondary | ICD-10-CM

## 2023-02-17 DIAGNOSIS — J479 Bronchiectasis, uncomplicated: Secondary | ICD-10-CM

## 2023-02-17 DIAGNOSIS — J3802 Paralysis of vocal cords and larynx, bilateral: Secondary | ICD-10-CM

## 2023-02-17 DIAGNOSIS — D839 Common variable immunodeficiency, unspecified: Secondary | ICD-10-CM | POA: Diagnosis not present

## 2023-02-17 NOTE — Progress Notes (Unsigned)
[place] [facility] [place] - [place] - [place] [facility] [place]   Follow-up Note  Referring Provider: [name], MD Primary Provider: [name], MD Date of Office Visit: 02/17/2023  Subjective:   Kayla [name] (DOB: Oct 09, 1950) is a 72 y.o. female who [facility] in re-evaluation of the following:  HPI: [name] returns to this clinic in evaluation of CVID, bronchiectasis, vocal cord paralysis, reflux, nocturnal deoxygenation.  I last saw her in this clinic 25 November 2022.  She is doing much better at this point in time.  Although she does have a chronic cough that issue has definitely decreased in intensity and frequency and she does not have as much guttural and throat issues than she has had in the past.  She has been consistently using her immunoglobulin infusions and she has been consistently using anti-inflammatory nebulized medications for her airway and she has been consistently treating reflux.  During her last visit we checked her immunoglobulin levels and her IgG was still not particularly high and we increased her weekly immunoglobulin infusion from 7 g to 9 g.  She has been placed on preventative azithromycin therapy as per pulmonary.  She had a CT scan of her chest for persistent chest x-ray abnormality presently called pneumonia.  The results are not available at this time.  She did obtain a flu vaccine this year.  She is undergoing PT for her lower extremity weakness and has had 4 sessions to date.  Allergies as of 02/17/2023       Reactions   Tramadol Other (See Comments)   Hallucinations   Contrast Media [iodinated Contrast Media] Rash   Iohexol Hives   Morphine And Codeine Other (See Comments)   Hallucinations    Acetaminophen    Methylprednisolone Other (See Comments)   Decreases pts heart rate    Oxycodone    Oxycodone Hcl    Tizanidine Hcl    Hydrocodone Itching   Oxycodone-acetaminophen Rash, Other  (See Comments)   Hallucinations, also   Phenytoin Sodium Extended Rash   Zanaflex [tizanidine Hydrochloride] Rash        Medication List    ACCRUFeR 30 MG Caps Generic drug: Ferric Maltol Take 1 capsule (30 mg total) by mouth in the morning and at bedtime.   albuterol (2.5 MG/3ML) 0.083% nebulizer solution Commonly known as: PROVENTIL Inhale 3 mLs (2.5 mg total) by nebulization every 4 (four) hours as needed for wheezing or shortness of breath.   albuterol (2.5 MG/3ML) 0.083% nebulizer solution Commonly known as: PROVENTIL Use 1 vial in nebulizer every 4-6 hours as needed for shortness of breath and wheezing   azelastine 0.1 % nasal spray Commonly known as: ASTELIN Place 1 spray into both nostrils 2 (two) times daily. Use in each nostril as directed   azithromycin 250 MG tablet Commonly known as: ZITHROMAX Take 1 tablet (250 mg total) by mouth daily.   baclofen 10 MG tablet Commonly known as: LIORESAL Take 10 mg by mouth in the morning.   bisacodyl 5 MG EC tablet Commonly known as: DULCOLAX Take 5 mg by mouth See admin instructions. Take 5 mg by mouth in the morning and evening   budesonide 0.5 MG/2ML nebulizer solution Commonly known as: PULMICORT USE 1 VIAL  IN  NEBULIZER TWICE  DAILY - Rinse Mouth After Treatment   calcium carbonate 1250 (500 Ca) MG tablet Commonly known as: OS-CAL - dosed in mg of elemental calcium Take 1 tablet (  1,250 mg total) by mouth daily with breakfast.   dexlansoprazole 60 MG capsule Commonly known as: DEXILANT TAKE 1 CAPSULE BY MOUTH ONCE DAILY WITH BREAKFAST   famotidine 20 MG tablet Commonly known as: Pepcid One after supper   FLUoxetine 40 MG capsule Commonly known as: PROZAC Take 1 capsule (40 mg total) by mouth daily.   Flutter Devi Use as directed   formoterol 20 MCG/2ML nebulizer solution Commonly known as: Perforomist USE 1 VIAL  IN  NEBULIZER TWICE  DAILY - Morning and Evening   guaiFENesin 600 MG 12 hr  tablet Commonly known as: MUCINEX Take 1 tablet (600 mg total) by mouth 2 (two) times daily.   levothyroxine 100 MCG tablet Commonly known as: SYNTHROID TAKE 1 TABLET BY MOUTH ONCE DAILY BEFORE BREAKFAST   MAGNESIUM PO Take 1 tablet by mouth every evening.   methenamine 1 g tablet Commonly known as: HIPREX Take 1 tablet (1 g total) by mouth 2 (two) times daily with a meal.   mirtazapine 15 MG tablet Commonly known as: REMERON Take 1 tablet (15 mg total) by mouth at bedtime.   Myrbetriq 50 MG Tb24 tablet Generic drug: mirabegron ER Take 50 mg by mouth in the morning.   NON FORMULARY Take 1 tablet by mouth See admin instructions. [employer] Mature Multivitamin with Calcium- Take 1 tablet by mouth with breakfast in the morning   NON FORMULARY Take 1 capsule by mouth See admin instructions. [employer] probiotic advanced  digestive probiotic- Take 1 capsule by mouth in the morning   nystatin 100000 UNIT/ML suspension Commonly known as: MYCOSTATIN Swish, gargle, and swallow 5 mL after budesonide nebulizer treatment.   OXYGEN Inhale 2-3.5 L/min into the lungs continuous.   PRESCRIPTION MEDICATION Apply 1 Device topically See admin instructions. Afflovest- For 30 minutes in the morning and evening- to begin @ 60% and work up to 80%   promethazine-dextromethorphan 6.25-15 MG/5ML syrup Commonly known as: PROMETHAZINE-DM Take 2.54ml-5ml every 6 hours for cough   sodium chloride HYPERTONIC 3 % nebulizer solution Take by nebulization 2 (two) times daily as needed for cough. Dx J44.9   tamsulosin 0.4 MG Caps capsule Commonly known as: FLOMAX Take 0.4 mg by mouth in the morning.   temazepam 15 MG capsule Commonly known as: RESTORIL Take 1 capsule (15 mg total) by mouth every evening.   torsemide 10 MG tablet Commonly known as: DEMADEX Take 1 tablet (10 mg total) by mouth daily.   Vitaline CoQ10 300 MG Wafr Generic drug: Coenzyme Q10 Take 300 mg by mouth in the  morning.   Yupelri 175 MCG/3ML nebulizer solution Generic drug: revefenacin USE 1 VIAL IN NEBULIZER DAILY    Past Medical History:  Diagnosis Date   Arthritis    oa   Asthma    Bilateral vocal cord paralysis    Bronchiectasis (HCC)    Cervical cancer (HCC) 2005   Depression    GERD (gastroesophageal reflux disease)    Hiatal hernia    Hypothyroidism    Immunodeficiency (HCC)    Gets IVIG infusions   Insomnia    Multiple sclerosis (HCC) 1996   Osteoporosis    Pneumonia 2024, 2014, 1992   Urine retention     Past Surgical History:  Procedure Laterality Date   ABDOMINAL HYSTERECTOMY  03/25/2003   for cervical cancer, complete   BRONCHIAL BIOPSY  10/26/2020   Procedure: BRONCHIAL BIOPSIES;  Surgeon: Josephine Igo, DO;  Location: [facility];  Service: Pulmonary;;   BRONCHIAL BRUSHINGS  10/26/2020  Procedure: BRONCHIAL BRUSHINGS;  Surgeon: Josephine Igo, DO;  Location: [facility];  Service: Pulmonary;;   BRONCHIAL WASHINGS  10/26/2020   Procedure: BRONCHIAL WASHINGS;  Surgeon: Josephine Igo, DO;  Location: [facility];  Service: Pulmonary;;   BRONCHIAL WASHINGS  08/19/2022   Procedure: BRONCHIAL WASHINGS;  Surgeon: Josephine Igo, DO;  Location: [facility];  Service: Cardiopulmonary;;   CATARACT EXTRACTION, BILATERAL Bilateral    lens for cataracts   CONVERSION TO TOTAL HIP Right 09/18/2015   Procedure: CONVERSION OF RIGHT HEMI ARTHROPLASTY TO TOTAL HIP ARTHROPLASTY ACETABULAR REVISON ;  Surgeon: Durene Romans, MD;  Location: [facility];  Service: Orthopedics;  Laterality: Right;   HIP ARTHROPLASTY Right 03/01/2013   Procedure: ARTHROPLASTY BIPOLAR HIP;  Surgeon: Shelda Pal, MD;  Location: [facility];  Service: Orthopedics;  Laterality: Right;   HIP CLOSED REDUCTION Right 03/12/2013   Procedure: CLOSED REDUCTION HIP;  Surgeon: [name], MD;  Location: [facility];  Service: Orthopedics;  Laterality: Right;   HIP CLOSED REDUCTION Right 03/19/2013   Procedure:  CLOSED REDUCTION HIP;  Surgeon: Javier Docker, MD;  Location: [facility];  Service: Orthopedics;  Laterality: Right;   HIP CLOSED REDUCTION Right 11/10/2015   Procedure: CLOSED MANIPULATION HIP;  Surgeon: Samson Frederic, MD;  Location: [facility];  Service: Orthopedics;  Laterality: Right;   HIP CLOSED REDUCTION Right 06/05/2017   Procedure: CLOSED REDUCTION HIP;  Surgeon: Ranee Gosselin, MD;  Location: [facility];  Service: Orthopedics;  Laterality: Right;   SMALL INTESTINE SURGERY     TEE WITHOUT CARDIOVERSION N/A 09/22/2022   Procedure: TRANSESOPHAGEAL ECHOCARDIOGRAM;  Surgeon: Pricilla Riffle, MD;  [facility];  Service: Cardiovascular;  Laterality: N/A;   TOTAL HIP REVISION Right 06/06/2017   Procedure: TOTAL HIP REVISION;  Surgeon: Ranee Gosselin, MD;  Location: [facility];  Service: Orthopedics;  Laterality: Right;   VESICOVAGINAL FISTULA CLOSURE W/ TAH  03/25/2003   VIDEO BRONCHOSCOPY N/A 10/26/2020   Procedure: VIDEO BRONCHOSCOPY WITH FLUORO;  Surgeon: Josephine Igo, DO;  Location: [facility];  Service: Pulmonary;  Laterality: N/A;   VIDEO BRONCHOSCOPY Left 08/19/2022   Procedure: VIDEO BRONCHOSCOPY WITH FLUORO; WITH BAL;  Surgeon: Josephine Igo, DO;  Location: [facility];  Service: Cardiopulmonary;  Laterality: Left;    Review of systems negative except as noted in HPI / PMHx or noted below:  Review of Systems  Constitutional: Negative.   HENT: Negative.    Eyes: Negative.   Respiratory: Negative.    Cardiovascular: Negative.   Gastrointestinal: Negative.   Genitourinary: Negative.   Musculoskeletal: Negative.   Skin: Negative.   Neurological: Negative.   Endo/Heme/Allergies: Negative.   Psychiatric/Behavioral: Negative.       Objective:   Vitals:   02/17/23 1546 02/17/23 1725  BP: (!) 80/40 100/62  Pulse: 75   Temp: 97.8 F (36.6 C)   SpO2: 100%    Height: 5\' 3"  (160 cm)  Weight: 109 lb 4.8 oz (49.6 kg)   Physical Exam Constitutional:       Appearance: She is not diaphoretic.  HENT:     Head: Normocephalic.     Right Ear: Tympanic membrane, ear canal and external ear normal.     Left Ear: Tympanic membrane, ear canal and external ear normal.     Nose: Nose normal. No mucosal edema or rhinorrhea.     Mouth/Throat:     Pharynx: Uvula midline. No oropharyngeal exudate.  Eyes:     Conjunctiva/sclera: Conjunctivae normal.  Neck:     Thyroid: No thyromegaly.     Trachea: Trachea normal. No tracheal tenderness or tracheal deviation.  Cardiovascular:     Rate and Rhythm: Normal rate and regular rhythm.     Heart sounds: Normal heart sounds, S1 normal and S2 normal. No murmur heard. Pulmonary:     Effort: No respiratory distress.     Breath sounds: Normal breath sounds. No stridor. No wheezing or rales.  Lymphadenopathy:     Head:     Right side of head: No tonsillar adenopathy.     Left side of head: No tonsillar adenopathy.     Cervical: No cervical adenopathy.  Skin:    Findings: No erythema or rash.     Nails: There is no clubbing.  Neurological:     Mental Status: She is alert.     Diagnostics: Results of blood tests obtained 25 November 2022 identifies IgG 762 Mg/DL, IgM 22 Mg/DL, IgA 53 MGs/DL  Assessment and Plan:   1. CVID (common variable immunodeficiency) (HCC)   2. Bronchiectasis without complication (HCC)   3. Bilateral vocal cord paralysis   4. Gastroesophageal reflux disease, unspecified whether esophagitis present    1. Continue immunoglobulin infusions weekly - 9 gm  2.  Continue to treat inflammation of airway:   A. Budesonide 5 mg - nebulize 2 times per day  B. Formoterol - nebulize 2 times per day  C. Yupelri - nebulize 1 time per day  3. If needed:   A. Albuterol nebulization every 4-6 hours  B. Percussion vest to remove mucous  4. Treat reflux with the following:   A. Continue Dexilant and famotidine   5. Continue oxygen  6. Blood - IgA/G/M  8. Return to clinic in 12 weeks or  earlier if problem  Charisa appears to be doing better on her current plan and we will keep her on 9 g of immunoglobin infusion every week and she can continue to use anti-inflammatory agents for airway and continue to treat reflux and continue on oxygen and we will see her back in this clinic in 12 weeks.  I will contact her with the results of her blood test once they are available for review.  Laurette Schimke, MD Allergy / Immunology [facility]

## 2023-02-17 NOTE — Patient Instructions (Addendum)
  1. Continue immunoglobulin infusions weekly - 9 gm  2.  Continue to treat inflammation of airway:   A. Budesonide 5 mg - nebulize 2 times per day  B. Formoterol - nebulize 2 times per day  C. Yupelri - nebulize 1 time per day  3. If needed:   A. Albuterol nebulization every 4-6 hours  B. Percussion vest to remove mucous  4. Treat reflux with the following:   A. Continue Dexilant and famotidine   5. Continue oxygen  6. Blood - IgA/G/M  8. Return to clinic in 12 weeks or earlier if problem

## 2023-02-18 ENCOUNTER — Other Ambulatory Visit: Payer: Self-pay

## 2023-02-18 ENCOUNTER — Encounter: Payer: Self-pay | Admitting: Allergy and Immunology

## 2023-02-18 ENCOUNTER — Telehealth: Payer: Self-pay

## 2023-02-18 DIAGNOSIS — S42211D Unspecified displaced fracture of surgical neck of right humerus, subsequent encounter for fracture with routine healing: Secondary | ICD-10-CM | POA: Diagnosis not present

## 2023-02-18 DIAGNOSIS — R296 Repeated falls: Secondary | ICD-10-CM | POA: Diagnosis not present

## 2023-02-18 DIAGNOSIS — G35 Multiple sclerosis: Secondary | ICD-10-CM | POA: Diagnosis not present

## 2023-02-18 DIAGNOSIS — R260 Ataxic gait: Secondary | ICD-10-CM | POA: Diagnosis not present

## 2023-02-18 DIAGNOSIS — D508 Other iron deficiency anemias: Secondary | ICD-10-CM

## 2023-02-18 LAB — IGG, IGA, IGM
IgA/Immunoglobulin A, Serum: 46 mg/dL — ABNORMAL LOW (ref 87–352)
IgG (Immunoglobin G), Serum: 1205 mg/dL (ref 586–1602)
IgM (Immunoglobulin M), Srm: 27 mg/dL (ref 26–217)

## 2023-02-18 MED ORDER — ACCRUFER 30 MG PO CAPS: 1.0000 | ORAL_CAPSULE | Freq: Two times a day (BID) | ORAL | 1 refills | Status: DC

## 2023-02-18 NOTE — Telephone Encounter (Signed)
Medication has been refilled and sent to her pharmacy .

## 2023-02-18 NOTE — Telephone Encounter (Signed)
Patient is requesting refill on Ferric maltol 30 mg to be sent to pharmacy.

## 2023-02-18 NOTE — Telephone Encounter (Signed)
Pt called wanting that medication sent to [facility] instead.

## 2023-02-22 ENCOUNTER — Other Ambulatory Visit: Payer: Self-pay | Admitting: Internal Medicine

## 2023-02-22 DIAGNOSIS — F3341 Major depressive disorder, recurrent, in partial remission: Secondary | ICD-10-CM

## 2023-02-23 DIAGNOSIS — S42211D Unspecified displaced fracture of surgical neck of right humerus, subsequent encounter for fracture with routine healing: Secondary | ICD-10-CM | POA: Diagnosis not present

## 2023-02-23 DIAGNOSIS — G35 Multiple sclerosis: Secondary | ICD-10-CM | POA: Diagnosis not present

## 2023-02-23 DIAGNOSIS — R296 Repeated falls: Secondary | ICD-10-CM | POA: Diagnosis not present

## 2023-02-23 DIAGNOSIS — R260 Ataxic gait: Secondary | ICD-10-CM | POA: Diagnosis not present

## 2023-02-25 ENCOUNTER — Encounter: Payer: Self-pay | Admitting: Internal Medicine

## 2023-02-25 DIAGNOSIS — G35 Multiple sclerosis: Secondary | ICD-10-CM | POA: Diagnosis not present

## 2023-02-25 DIAGNOSIS — R260 Ataxic gait: Secondary | ICD-10-CM | POA: Diagnosis not present

## 2023-02-25 DIAGNOSIS — R296 Repeated falls: Secondary | ICD-10-CM | POA: Diagnosis not present

## 2023-02-25 DIAGNOSIS — S42211D Unspecified displaced fracture of surgical neck of right humerus, subsequent encounter for fracture with routine healing: Secondary | ICD-10-CM | POA: Diagnosis not present

## 2023-02-26 ENCOUNTER — Other Ambulatory Visit: Payer: Self-pay

## 2023-02-26 ENCOUNTER — Telehealth: Payer: Self-pay

## 2023-02-26 DIAGNOSIS — D508 Other iron deficiency anemias: Secondary | ICD-10-CM

## 2023-02-26 MED ORDER — ACCRUFER 30 MG PO CAPS: 1.0000 | ORAL_CAPSULE | Freq: Two times a day (BID) | ORAL | 1 refills | Status: DC

## 2023-02-26 NOTE — Patient Outreach (Signed)
  Care Coordination   02/26/2023 Name: JAKALYN RENNA MRN: [id number] DOB: November 04, 1950   Care Coordination Outreach Attempts:  An unsuccessful telephone outreach was attempted for a scheduled appointment today.  Follow Up Plan:  Additional outreach attempts will be made to offer the patient care coordination information and services.   Encounter Outcome:  No Answer   Care Coordination Interventions:  No, not indicated    Kathyrn Sheriff, RN, MSN, BSN, CCM Care Management Coordinator [phone]

## 2023-02-27 ENCOUNTER — Ambulatory Visit (INDEPENDENT_AMBULATORY_CARE_PROVIDER_SITE_OTHER): Payer: Medicare Other

## 2023-02-27 DIAGNOSIS — Z0000 Encounter for general adult medical examination without abnormal findings: Secondary | ICD-10-CM | POA: Diagnosis not present

## 2023-02-27 NOTE — Progress Notes (Signed)
Subjective:   Kayla [name] is a 72 y.o. female who presents for Medicare Annual (Subsequent) preventive examination.  Visit Complete: Virtual I connected with  Kayla [name] on 02/27/23 by a audio enabled telemedicine application and verified that I am speaking with the correct person using two identifiers.  Patient Location: Home  Provider Location: Office/Clinic  I discussed the limitations of evaluation and management by telemedicine. The patient expressed understanding and agreed to proceed.  Vital Signs: Because this visit was a virtual/telehealth visit, some criteria may be missing or patient reported. Any vitals not documented were not able to be obtained and vitals that have been documented are patient reported.  Patient Medicare AWV questionnaire was completed by the patient on 02/24/2023; I have confirmed that all information answered by patient is correct and no changes since this date.  Cardiac Risk Factors include: advanced age (>47men, >57 women)     Objective:    Today's Vitals   There is no height or weight on file to calculate BMI.     02/27/2023    3:53 PM 10/24/2022   11:23 AM 09/16/2022    7:00 PM 09/16/2022    1:06 PM 09/01/2022    7:50 AM 08/31/2022    2:46 PM 08/19/2022    9:32 AM  Advanced Directives  Does Patient Have a Medical Advance Directive? Yes No Yes Yes  No Yes  Type of Estate agent of [place];Living will  Healthcare Power of [employer] of [place];Living will   Healthcare Power of [place];Living will  Does patient want to make changes to medical advance directive?   No - Patient declined      Copy of Healthcare Power of Attorney in Chart? No - copy requested  No - copy requested    No - copy requested  Would patient like information on creating a medical advance directive?  No - Patient declined No - Patient declined  No - Patient declined      Current Medications (verified) Outpatient Encounter  Medications as of 02/27/2023  Medication Sig   albuterol (PROVENTIL) (2.5 MG/3ML) 0.083% nebulizer solution Inhale 3 mLs (2.5 mg total) by nebulization every 4 (four) hours as needed for wheezing or shortness of breath. (Patient taking differently: Take 2.5 mg by nebulization See admin instructions. 2.5 mg via nebulization in the morning, midday, and evening)   albuterol (PROVENTIL) (2.5 MG/3ML) 0.083% nebulizer solution Use 1 vial in nebulizer every 4-6 hours as needed for shortness of breath and wheezing   azelastine (ASTELIN) 0.1 % nasal spray Place 1 spray into both nostrils 2 (two) times daily. Use in each nostril as directed   azithromycin (ZITHROMAX) 250 MG tablet Take 1 tablet (250 mg total) by mouth daily.   baclofen (LIORESAL) 10 MG tablet Take 10 mg by mouth in the morning.   bisacodyl (DULCOLAX) 5 MG EC tablet Take 5 mg by mouth See admin instructions. Take 5 mg by mouth in the morning and evening   budesonide (PULMICORT) 0.5 MG/2ML nebulizer solution USE 1 VIAL  IN  NEBULIZER TWICE  DAILY - Rinse Mouth After Treatment   calcium carbonate (OS-CAL - DOSED IN MG OF ELEMENTAL CALCIUM) 1250 (500 Ca) MG tablet Take 1 tablet (1,250 mg total) by mouth daily with breakfast.   Coenzyme Q10 (VITALINE COQ10) 300 MG WAFR Take 300 mg by mouth in the morning.   dexlansoprazole (DEXILANT) 60 MG capsule TAKE 1 CAPSULE BY MOUTH ONCE DAILY WITH BREAKFAST (Patient taking differently:  Take 60 mg by mouth in the morning.)   famotidine (PEPCID) 20 MG tablet One after supper (Patient taking differently: Take 20 mg by mouth every evening.)   Ferric Maltol (ACCRUFER) 30 MG CAPS Take 1 capsule (30 mg total) by mouth in the morning and at bedtime.   FLUoxetine (PROZAC) 40 MG capsule Take 1 capsule (40 mg total) by mouth daily.   formoterol (PERFOROMIST) 20 MCG/2ML nebulizer solution USE 1 VIAL  IN  NEBULIZER TWICE  DAILY - Morning and Evening   guaiFENesin (MUCINEX) 600 MG 12 hr tablet Take 1 tablet (600 mg total)  by mouth 2 (two) times daily. (Patient taking differently: Take 600 mg by mouth See admin instructions. Take 600 mg by mouth in the morning and evening)   levothyroxine (SYNTHROID) 100 MCG tablet TAKE 1 TABLET BY MOUTH ONCE DAILY BEFORE BREAKFAST   MAGNESIUM PO Take 1 tablet by mouth every evening.   methenamine (HIPREX) 1 g tablet Take 1 tablet (1 g total) by mouth 2 (two) times daily with a meal.   mirabegron ER (MYRBETRIQ) 50 MG TB24 tablet Take 50 mg by mouth in the morning.   mirtazapine (REMERON) 15 MG tablet Take 1 tablet (15 mg total) by mouth at bedtime.   NON FORMULARY Take 1 tablet by mouth See admin instructions. [employer] Mature Multivitamin with Calcium- Take 1 tablet by mouth with breakfast in the morning   NON FORMULARY Take 1 capsule by mouth See admin instructions. [employer] probiotic advanced  digestive probiotic- Take 1 capsule by mouth in the morning   nystatin (MYCOSTATIN) 100000 UNIT/ML suspension Swish, gargle, and swallow 5 mL after budesonide nebulizer treatment.   OXYGEN Inhale 2-3.5 L/min into the lungs continuous.   PRESCRIPTION MEDICATION Apply 1 Device topically See admin instructions. Afflovest- For 30 minutes in the morning and evening- to begin @ 60% and work up to 80%   promethazine-dextromethorphan (PROMETHAZINE-DM) 6.25-15 MG/5ML syrup Take 2.57ml-5ml every 6 hours for cough   Respiratory Therapy Supplies (FLUTTER) DEVI Use as directed (Patient taking differently: Inhale 1 Device into the lungs as directed.)   revefenacin (YUPELRI) 175 MCG/3ML nebulizer solution USE 1 VIAL IN NEBULIZER DAILY   sodium chloride HYPERTONIC 3 % nebulizer solution Take by nebulization 2 (two) times daily as needed for cough. Dx J44.9   tamsulosin (FLOMAX) 0.4 MG CAPS capsule Take 0.4 mg by mouth in the morning.   temazepam (RESTORIL) 15 MG capsule Take 1 capsule (15 mg total) by mouth every evening.   torsemide (DEMADEX) 10 MG tablet Take 1 tablet (10 mg total) by mouth daily.    No facility-administered encounter medications on file as of 02/27/2023.    Allergies (verified) Tramadol, Contrast media [iodinated contrast media], Iohexol, Morphine and codeine, Acetaminophen, Methylprednisolone, Oxycodone, Oxycodone hcl, Tizanidine hcl, Hydrocodone, Oxycodone-acetaminophen, Phenytoin sodium extended, and Zanaflex [tizanidine hydrochloride]   History: Past Medical History:  Diagnosis Date   Arthritis    oa   Asthma    Bilateral vocal cord paralysis    Bronchiectasis (HCC)    Cervical cancer (HCC) 2005   Depression    GERD (gastroesophageal reflux disease)    Hiatal hernia    Hypothyroidism    Immunodeficiency (HCC)    Gets IVIG infusions   Insomnia    Multiple sclerosis (HCC) 1996   Osteoporosis    Pneumonia 2024, 2014, 1992   Urine retention    Past Surgical History:  Procedure Laterality Date   ABDOMINAL HYSTERECTOMY  03/25/2003   for cervical cancer, complete  BRONCHIAL BIOPSY  10/26/2020   Procedure: BRONCHIAL BIOPSIES;  Surgeon: Josephine Igo, DO;  Location: [facility];  Service: Pulmonary;;   BRONCHIAL BRUSHINGS  10/26/2020   Procedure: BRONCHIAL BRUSHINGS;  Surgeon: Josephine Igo, DO;  Location: [facility];  Service: Pulmonary;;   BRONCHIAL WASHINGS  10/26/2020   Procedure: BRONCHIAL WASHINGS;  Surgeon: Josephine Igo, DO;  Location: [facility];  Service: Pulmonary;;   BRONCHIAL WASHINGS  08/19/2022   Procedure: BRONCHIAL WASHINGS;  Surgeon: Josephine Igo, DO;  Location: [facility];  Service: Cardiopulmonary;;   CATARACT EXTRACTION, BILATERAL Bilateral    lens for cataracts   CONVERSION TO TOTAL HIP Right 09/18/2015   Procedure: CONVERSION OF RIGHT HEMI ARTHROPLASTY TO TOTAL HIP ARTHROPLASTY ACETABULAR REVISON ;  Surgeon: Durene Romans, MD;  Location: [facility];  Service: Orthopedics;  Laterality: Right;   HIP ARTHROPLASTY Right 03/01/2013   Procedure: ARTHROPLASTY BIPOLAR HIP;  Surgeon: Shelda Pal, MD;  Location: [facility];   Service: Orthopedics;  Laterality: Right;   HIP CLOSED REDUCTION Right 03/12/2013   Procedure: CLOSED REDUCTION HIP;  Surgeon: [name], MD;  Location: [facility];  Service: Orthopedics;  Laterality: Right;   HIP CLOSED REDUCTION Right 03/19/2013   Procedure: CLOSED REDUCTION HIP;  Surgeon: Javier Docker, MD;  Location: [facility];  Service: Orthopedics;  Laterality: Right;   HIP CLOSED REDUCTION Right 11/10/2015   Procedure: CLOSED MANIPULATION HIP;  Surgeon: Samson Frederic, MD;  Location: [facility];  Service: Orthopedics;  Laterality: Right;   HIP CLOSED REDUCTION Right 06/05/2017   Procedure: CLOSED REDUCTION HIP;  Surgeon: Ranee Gosselin, MD;  Location: [facility];  Service: Orthopedics;  Laterality: Right;   SMALL INTESTINE SURGERY     TEE WITHOUT CARDIOVERSION N/A 09/22/2022   Procedure: TRANSESOPHAGEAL ECHOCARDIOGRAM;  Surgeon: Pricilla Riffle, MD;  Location: [facility];  Service: Cardiovascular;  Laterality: N/A;   TOTAL HIP REVISION Right 06/06/2017   Procedure: TOTAL HIP REVISION;  Surgeon: Ranee Gosselin, MD;  Location: [facility];  Service: Orthopedics;  Laterality: Right;   VESICOVAGINAL FISTULA CLOSURE W/ TAH  03/25/2003   VIDEO BRONCHOSCOPY N/A 10/26/2020   Procedure: VIDEO BRONCHOSCOPY WITH FLUORO;  Surgeon: Josephine Igo, DO;  Location: [facility];  Service: Pulmonary;  Laterality: N/A;   VIDEO BRONCHOSCOPY Left 08/19/2022   Procedure: VIDEO BRONCHOSCOPY WITH FLUORO; WITH BAL;  Surgeon: Josephine Igo, DO;  Location: [facility];  Service: Cardiopulmonary;  Laterality: Left;   Family History  Problem Relation Age of Onset   Other Mother        COVID   Colon cancer Father    Atrial fibrillation Brother    Heart attack Maternal Grandfather    Parkinson's disease Paternal Grandfather    Esophageal cancer Neg Hx    Stomach cancer Neg Hx    Rectal cancer Neg Hx    Social History   Socioeconomic History   Marital status: Married    Spouse name: Not on file   Number of  children: 2   Years of education: Not on file   Highest education level: 12th grade  Occupational History   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  Tobacco Use   Smoking status: Never   Smokeless tobacco: Never  Vaping Use   Vaping status: Never Used  Substance and Sexual Activity   Alcohol use: Not Currently    Alcohol/week: 2.0 standard drinks of alcohol    Types: 1 Glasses of wine, 1 Cans of beer per week  Comment: 2x/week    Drug use: No   Sexual activity: Yes  Other Topics Concern   Not on file  Social History Narrative   Not on file   Social Determinants of Health   Financial Resource Strain: Low Risk  (02/24/2023)   Overall Financial Resource Strain (CARDIA)    Difficulty of Paying Living Expenses: Not hard at all  Food Insecurity: No Food Insecurity (02/24/2023)   Hunger Vital Sign    Worried About Running Out of Food in the Last Year: Never true    Ran Out of Food in the Last Year: Never true  Transportation Needs: No Transportation Needs (02/24/2023)   PRAPARE - Administrator, Civil Service (Medical): No    Lack of Transportation (Non-Medical): No  Physical Activity: Insufficiently Active (02/24/2023)   Exercise Vital Sign    Days of Exercise per Week: 2 days    Minutes of Exercise per Session: 60 min  Stress: No Stress Concern Present (02/24/2023)   [employer] of Occupational Health - Occupational Stress Questionnaire    Feeling of Stress : Only a little  Social Connections: Socially Integrated (07/07/2022)   Social Connection and Isolation Panel [NHANES]    Frequency of Communication with Friends and Family: Three times a week    Frequency of Social Gatherings with Friends and Family: Once a week    Attends Religious Services: 1 to 4 times per year    Active Member of [employer] or Organizations: Yes    Attends Banker Meetings: 1 to 4 times per year    Marital Status: Married    Tobacco Counseling Counseling given: Not  Answered   Clinical Intake:  Pre-visit preparation completed: Yes  Pain : No/denies pain     Nutritional Risks: None Diabetes: No  How often do you need to have someone help you when you read instructions, pamphlets, or other written materials from your doctor or pharmacy?: 1 - Never  Interpreter Needed?: No  Information entered by :: NAllen LPN   Activities of Daily Living    02/24/2023    1:21 PM 10/24/2022   11:19 AM  In your present state of health, do you have any difficulty performing the following activities:  Hearing? 0   Vision? 0   Difficulty concentrating or making decisions? 0   Walking or climbing stairs? 1   Dressing or bathing? 1   Comment takes a while   Doing errands, shopping? 1 1  Comment someone is with her   Preparing Food and eating ? Y   Comment family manages   Using the Toilet? N   In the past six months, have you accidently leaked urine? Y   Do you have problems with loss of bowel control? N   Managing your Medications? N   Managing your Finances? N   Housekeeping or managing your Housekeeping? N     Patient Care Team: [name], MD as PCP - General (Internal Medicine) Christia Reading, MD (Otolaryngology) Odette Fraction, MD as Consulting Physician (Infectious Diseases) Tyron Russell, MD (Neurology) Livingston Diones, Feliberto Gottron, MD (Allergy and Immunology)  Indicate any recent Medical Services you may have received from other than [facility] providers in the past year (date may be approximate).     Assessment:   This is a routine wellness examination for Kayla [name].  Hearing/Vision screen Hearing Screening - Comments:: Denies hearing issues Vision Screening - Comments:: Regular eye exams, Dr. Shea Evans   Goals Addressed  This Visit's Progress    Patient Stated       02/27/2023, regain mobility and get over pneumonia       Depression Screen    02/27/2023    3:54 PM 11/26/2022    1:45 PM 07/14/2022   11:09 AM 07/08/2022     8:38 AM 05/22/2022    9:17 AM 01/16/2022    2:02 PM 01/10/2022    1:22 PM  PHQ 2/9 Scores  PHQ - 2 Score 0 0 3  0 0 0  PHQ- 9 Score 3  10   2  0  Exception Documentation    Patient refusal       Fall Risk    02/24/2023    1:21 PM 11/26/2022    1:45 PM 07/14/2022   11:09 AM 01/16/2022    2:02 PM 01/10/2022    1:22 PM  Fall Risk   Falls in the past year? 1 1 1  0 0  Comment slipped in kitchen      Number falls in past yr: 0 0 0 0 0  Injury with Fall? 1 1 1  0 0  Comment broke pelvic bone  fracture pelvic    Risk for fall due to : Impaired mobility;Impaired balance/gait;Medication side effect Impaired balance/gait  No Fall Risks No Fall Risks  Follow up Falls prevention discussed;Falls evaluation completed Falls evaluation completed  Falls evaluation completed Falls evaluation completed    MEDICARE RISK AT HOME: Medicare Risk at Home Any stairs in or around the home?: Yes If so, are there any without handrails?: No Home free of loose throw rugs in walkways, pet beds, electrical cords, etc?: Yes Adequate lighting in your home to reduce risk of falls?: Yes Life alert?: No Use of a cane, walker or w/c?: Yes Grab bars in the bathroom?: Yes Shower chair or bench in shower?: Yes Elevated toilet seat or a handicapped toilet?: Yes  TIMED UP AND GO:  Was the test performed?  No    Cognitive Function:        02/27/2023    3:55 PM  6CIT Screen  What Year? 0 points  What month? 0 points  What time? 0 points  Count back from 20 0 points  Months in reverse 0 points  Repeat phrase 4 points  Total Score 4 points    Immunizations Immunization History  Administered Date(s) Administered   DTP 04/10/2009   Fluad Quad(high Dose 65+) 12/29/2018, 01/29/2022   Fluad Trivalent(High Dose 65+) 12/09/2022   Influenza Split 12/10/2010, 02/11/2012, 12/22/2012, 01/02/2014, 12/22/2014   Influenza Whole 02/19/2005, 03/28/2009, 01/23/2016   Influenza, High Dose Seasonal PF 02/23/2018    Influenza,inj,Quad PF,6+ Mos 02/10/2017   Influenza-Unspecified 12/02/2018, 01/08/2019, 12/22/2019, 01/15/2021, 02/05/2021   Moderna Covid-19 Vaccine Bivalent Booster 68yrs & up 02/05/2021, 01/20/2022   Moderna Sars-Covid-2 Vaccination 04/29/2019, 06/01/2019, 01/19/2020, 08/14/2020   PNEUMOCOCCAL CONJUGATE-20 10/22/2022   Pfizer(Comirnaty)Fall Seasonal Vaccine 12 years and older 09/23/2022   Pneumococcal Conjugate-13 11/06/2015   Pneumococcal Polysaccharide-23 03/28/2009, 04/26/2019   Respiratory Syncytial Virus Vaccine,Recomb Aduvanted(Arexvy) 12/26/2021   Tdap 04/26/2019, 06/19/2022   Zoster Recombinant(Shingrix) 09/07/2019, 02/17/2020    TDAP status: Up to date  Flu Vaccine status: Up to date  Pneumococcal vaccine status: Up to date  Covid-19 vaccine status: Completed vaccines  Qualifies for Shingles Vaccine? Yes   Zostavax completed Yes   Shingrix Completed?: Yes  Screening Tests Health Maintenance  Topic Date Due   Hepatitis C Screening  Never done   COVID-19 Vaccine (8 - 2023-24 season)  11/23/2022   MAMMOGRAM  02/22/2023   Colonoscopy  03/05/2026   DTaP/Tdap/Td (4 - Td or Tdap) 06/18/2032   Pneumonia Vaccine 54+ Years old  Completed   INFLUENZA VACCINE  Completed   DEXA SCAN  Completed   Zoster Vaccines- Shingrix  Completed   HPV VACCINES  Aged Out    Health Maintenance  Health Maintenance Due  Topic Date Due   Hepatitis C Screening  Never done   COVID-19 Vaccine (8 - 2023-24 season) 11/23/2022   MAMMOGRAM  02/22/2023    Colorectal cancer screening: Type of screening: Colonoscopy. Completed 03/05/2021. Repeat every 5 years  Mammogram status: scheduled for 03/06/2023  Bone Density status: Completed 05/01/2020.   Lung Cancer Screening: (Low Dose CT Chest recommended if Age 48-80 years, 20 pack-year currently smoking OR have quit w/in 15years.) does not qualify.   Lung Cancer Screening Referral: no  Additional Screening:  Hepatitis C Screening: does  qualify;  Vision Screening: Recommended annual ophthalmology exams for early detection of glaucoma and other disorders of the eye. Is the patient up to date with their annual eye exam?  Yes  Who is the provider or what is the name of the office in which the patient attends annual eye exams? Dr. Shea Evans If pt is not established with a provider, would they like to be referred to a provider to establish care? No .   Dental Screening: Recommended annual dental exams for proper oral hygiene  Diabetic Foot Exam: n/a  Community Resource Referral / Chronic Care Management: CRR required this visit?  No   CCM required this visit?  No     Plan:     I have personally reviewed and noted the following in the patient's chart:   Medical and social history Use of alcohol, tobacco or illicit drugs  Current medications and supplements including opioid prescriptions. Patient is not currently taking opioid prescriptions. Functional ability and status Nutritional status Physical activity Advanced directives List of other physicians Hospitalizations, surgeries, and ER visits in previous 12 months Vitals Screenings to include cognitive, depression, and falls Referrals and appointments  In addition, I have reviewed and discussed with patient certain preventive protocols, quality metrics, and best practice recommendations. A written personalized care plan for preventive services as well as general preventive health recommendations were provided to patient.     Barb Merino, LPN   16/03/958   After Visit Summary: (MyChart) Due to this being a telephonic visit, the after visit summary with patients personalized plan was offered to patient via MyChart   Nurse Notes: none

## 2023-02-27 NOTE — Patient Instructions (Signed)
Kayla [name] , Thank you for taking time to come for your Medicare Wellness Visit. I appreciate your ongoing commitment to your health goals. Please review the following plan we discussed and let me know if I can assist you in the future.   Referrals/Orders/Follow-Ups/Clinician Recommendations: none  This is a list of the screening recommended for you and due dates:  Health Maintenance  Topic Date Due   Hepatitis C Screening  Never done   COVID-19 Vaccine (8 - 2023-24 season) 11/23/2022   Mammogram  02/22/2023   Colon Cancer Screening  03/05/2026   DTaP/Tdap/Td vaccine (4 - Td or Tdap) 06/18/2032   Pneumonia Vaccine  Completed   Flu Shot  Completed   DEXA scan (bone density measurement)  Completed   Zoster (Shingles) Vaccine  Completed   HPV Vaccine  Aged Out    Advanced directives: (Copy Requested) Please bring a copy of your health care power of attorney and living will to the office to be added to your chart at your convenience.  Next Medicare Annual Wellness Visit scheduled for next year: Yes  Insert Preventive Care attachment Insert FALL PREVENTION attachment if needed

## 2023-03-02 DIAGNOSIS — R260 Ataxic gait: Secondary | ICD-10-CM | POA: Diagnosis not present

## 2023-03-02 DIAGNOSIS — G35 Multiple sclerosis: Secondary | ICD-10-CM | POA: Diagnosis not present

## 2023-03-02 DIAGNOSIS — R296 Repeated falls: Secondary | ICD-10-CM | POA: Diagnosis not present

## 2023-03-02 DIAGNOSIS — S42211D Unspecified displaced fracture of surgical neck of right humerus, subsequent encounter for fracture with routine healing: Secondary | ICD-10-CM | POA: Diagnosis not present

## 2023-03-04 DIAGNOSIS — S42211D Unspecified displaced fracture of surgical neck of right humerus, subsequent encounter for fracture with routine healing: Secondary | ICD-10-CM | POA: Diagnosis not present

## 2023-03-04 DIAGNOSIS — R3914 Feeling of incomplete bladder emptying: Secondary | ICD-10-CM | POA: Diagnosis not present

## 2023-03-04 DIAGNOSIS — R296 Repeated falls: Secondary | ICD-10-CM | POA: Diagnosis not present

## 2023-03-04 DIAGNOSIS — R260 Ataxic gait: Secondary | ICD-10-CM | POA: Diagnosis not present

## 2023-03-04 DIAGNOSIS — G35 Multiple sclerosis: Secondary | ICD-10-CM | POA: Diagnosis not present

## 2023-03-04 DIAGNOSIS — N3946 Mixed incontinence: Secondary | ICD-10-CM | POA: Diagnosis not present

## 2023-03-06 DIAGNOSIS — Z1231 Encounter for screening mammogram for malignant neoplasm of breast: Secondary | ICD-10-CM | POA: Diagnosis not present

## 2023-03-06 LAB — HM MAMMOGRAPHY

## 2023-03-09 ENCOUNTER — Encounter: Payer: Self-pay | Admitting: Internal Medicine

## 2023-03-09 DIAGNOSIS — S42211D Unspecified displaced fracture of surgical neck of right humerus, subsequent encounter for fracture with routine healing: Secondary | ICD-10-CM | POA: Diagnosis not present

## 2023-03-09 DIAGNOSIS — R296 Repeated falls: Secondary | ICD-10-CM | POA: Diagnosis not present

## 2023-03-09 DIAGNOSIS — R260 Ataxic gait: Secondary | ICD-10-CM | POA: Diagnosis not present

## 2023-03-09 DIAGNOSIS — G35 Multiple sclerosis: Secondary | ICD-10-CM | POA: Diagnosis not present

## 2023-03-10 ENCOUNTER — Encounter: Payer: Self-pay | Admitting: Internal Medicine

## 2023-03-10 ENCOUNTER — Ambulatory Visit: Payer: Medicare Other | Admitting: Internal Medicine

## 2023-03-10 VITALS — BP 112/68 | HR 82 | Temp 97.6°F | Ht 63.0 in | Wt 113.0 lb

## 2023-03-10 DIAGNOSIS — D75839 Thrombocytosis, unspecified: Secondary | ICD-10-CM | POA: Diagnosis not present

## 2023-03-10 DIAGNOSIS — I75023 Atheroembolism of bilateral lower extremities: Secondary | ICD-10-CM | POA: Diagnosis not present

## 2023-03-10 DIAGNOSIS — E871 Hypo-osmolality and hyponatremia: Secondary | ICD-10-CM | POA: Diagnosis not present

## 2023-03-10 DIAGNOSIS — E038 Other specified hypothyroidism: Secondary | ICD-10-CM | POA: Diagnosis not present

## 2023-03-10 LAB — BASIC METABOLIC PANEL
BUN: 30 mg/dL — ABNORMAL HIGH (ref 6–23)
CO2: 32 meq/L (ref 19–32)
Calcium: 9.4 mg/dL (ref 8.4–10.5)
Chloride: 98 meq/L (ref 96–112)
Creatinine, Ser: 0.88 mg/dL (ref 0.40–1.20)
GFR: 66.49 mL/min (ref >60.00)
Glucose, Bld: 65 mg/dL — ABNORMAL LOW (ref 70–99)
Potassium: 4.2 meq/L (ref 3.5–5.1)
Sodium: 137 meq/L (ref 135–145)

## 2023-03-10 LAB — CBC WITH DIFFERENTIAL/PLATELET
Basophils Absolute: 0.1 10*3/uL (ref 0.0–0.1)
Basophils Relative: 0.9 % (ref 0.0–3.0)
Eosinophils Absolute: 0.1 10*3/uL (ref 0.0–0.7)
Eosinophils Relative: 1.8 % (ref 0.0–5.0)
HCT: 38.7 % (ref 36.0–46.0)
Hemoglobin: 12.3 g/dL (ref 12.0–15.0)
Lymphocytes Relative: 34.7 % (ref 12.0–46.0)
Lymphs Abs: 2.9 10*3/uL (ref 0.7–4.0)
MCHC: 31.9 g/dL (ref 30.0–36.0)
MCV: 87.9 fL (ref 78.0–100.0)
Monocytes Absolute: 0.7 10*3/uL (ref 0.1–1.0)
Monocytes Relative: 8.4 % (ref 3.0–12.0)
Neutro Abs: 4.6 10*3/uL (ref 1.4–7.7)
Neutrophils Relative %: 54.2 % (ref 43.0–77.0)
Platelets: 258 10*3/uL (ref 150.0–400.0)
RBC: 4.4 Mil/uL (ref 3.87–5.11)
RDW: 15.8 % — ABNORMAL HIGH (ref 11.5–15.5)
WBC: 8.4 10*3/uL (ref 4.0–10.5)

## 2023-03-10 LAB — TSH: TSH: 1.07 u[IU]/mL (ref 0.35–5.50)

## 2023-03-10 LAB — IBC + FERRITIN
Ferritin: 84.6 ng/mL (ref 10.0–291.0)
Iron: 68 ug/dL (ref 42–145)
Saturation Ratios: 20.2 % (ref 20.0–50.0)
TIBC: 336 ug/dL (ref 250.0–450.0)
Transferrin: 240 mg/dL (ref 212.0–360.0)

## 2023-03-10 NOTE — Patient Instructions (Signed)

## 2023-03-10 NOTE — Progress Notes (Unsigned)
Subjective:  Patient ID: Kayla [name], female    DOB: 03-01-1951  Age: 72 y.o. MRN: [id number]  CC: No chief complaint on file.   HPI    Kayla [name] presents for f/up ---  Discussed the use of AI scribe software for clinical note transcription with the patient, who gave verbal consent to proceed.  History of Present Illness   The patient, with a history of pneumonia and an unspecified condition causing weight loss, presents with a recent weight gain attributed to increased appetite and dietary changes. She reports a persistent cough and is currently on erythromycin, with no associated fevers or chills. The patient recently underwent a CT scan, the results of which are pending interpretation.  The patient also reports a lack of strength, particularly when standing for extended periods, and requires supplemental oxygen when active. Despite this, she has resumed physical therapy and use of a walker. She is also on an appetite stimulant, which she believes has contributed to her weight gain.  The patient denies any gastrointestinal symptoms such as pain, nausea, or vomiting. She also reports an improvement in the sensation in her feet. Despite the weight gain, the patient's strength has been slow to return, with notable difficulty moving from the car to the clinic.       Outpatient Medications Prior to Visit  Medication Sig Dispense Refill   albuterol (PROVENTIL) (2.5 MG/3ML) 0.083% nebulizer solution Use 1 vial in nebulizer every 4-6 hours as needed for shortness of breath and wheezing 180 mL 1   azelastine (ASTELIN) 0.1 % nasal spray Place 1 spray into both nostrils 2 (two) times daily. Use in each nostril as directed 30 mL 1   azithromycin (ZITHROMAX) 250 MG tablet Take 1 tablet (250 mg total) by mouth daily. 30 tablet 5   baclofen (LIORESAL) 10 MG tablet Take 10 mg by mouth in the morning.     bisacodyl (DULCOLAX) 5 MG EC tablet Take 5 mg by mouth See admin instructions. Take 5 mg  by mouth in the morning and evening     budesonide (PULMICORT) 0.5 MG/2ML nebulizer solution USE 1 VIAL  IN  NEBULIZER TWICE  DAILY - Rinse Mouth After Treatment 120 mL 5   calcium carbonate (OS-CAL - DOSED IN MG OF ELEMENTAL CALCIUM) 1250 (500 Ca) MG tablet Take 1 tablet (1,250 mg total) by mouth daily with breakfast.     Coenzyme Q10 (VITALINE COQ10) 300 MG WAFR Take 300 mg by mouth in the morning.     dexlansoprazole (DEXILANT) 60 MG capsule TAKE 1 CAPSULE BY MOUTH ONCE DAILY WITH BREAKFAST (Patient taking differently: Take 60 mg by mouth in the morning.) 90 capsule 0   famotidine (PEPCID) 20 MG tablet One after supper (Patient taking differently: Take 20 mg by mouth every evening.) 30 tablet 11   Ferric Maltol (ACCRUFER) 30 MG CAPS Take 1 capsule (30 mg total) by mouth in the morning and at bedtime. 180 capsule 1   FLUoxetine (PROZAC) 40 MG capsule Take 1 capsule (40 mg total) by mouth daily. 90 capsule 1   formoterol (PERFOROMIST) 20 MCG/2ML nebulizer solution USE 1 VIAL  IN  NEBULIZER TWICE  DAILY - Morning and Evening 120 mL 5   guaiFENesin (MUCINEX) 600 MG 12 hr tablet Take 1 tablet (600 mg total) by mouth 2 (two) times daily. (Patient taking differently: Take 600 mg by mouth See admin instructions. Take 600 mg by mouth in the morning and evening) 30 tablet 0  levothyroxine (SYNTHROID) 100 MCG tablet TAKE 1 TABLET BY MOUTH ONCE DAILY BEFORE BREAKFAST 90 tablet 3   MAGNESIUM PO Take 1 tablet by mouth every evening.     methenamine (HIPREX) 1 g tablet Take 1 tablet (1 g total) by mouth 2 (two) times daily with a meal. 180 tablet 0   mirabegron ER (MYRBETRIQ) 50 MG TB24 tablet Take 50 mg by mouth in the morning.     mirtazapine (REMERON) 15 MG tablet Take 1 tablet (15 mg total) by mouth at bedtime. 90 tablet 1   NON FORMULARY Take 1 tablet by mouth See admin instructions. [employer] Mature Multivitamin with Calcium- Take 1 tablet by mouth with breakfast in the morning     NON FORMULARY  Take 1 capsule by mouth See admin instructions. [employer] probiotic advanced  digestive probiotic- Take 1 capsule by mouth in the morning     nystatin (MYCOSTATIN) 100000 UNIT/ML suspension Swish, gargle, and swallow 5 mL after budesonide nebulizer treatment. 300 mL 5   OXYGEN Inhale 2-3.5 L/min into the lungs continuous.     PRESCRIPTION MEDICATION Apply 1 Device topically See admin instructions. Afflovest- For 30 minutes in the morning and evening- to begin @ 60% and work up to 80%     promethazine-dextromethorphan (PROMETHAZINE-DM) 6.25-15 MG/5ML syrup Take 2.35ml-5ml every 6 hours for cough 240 mL 0   Respiratory Therapy Supplies (FLUTTER) DEVI Use as directed (Patient taking differently: Inhale 1 Device into the lungs as directed.) 1 each 0   revefenacin (YUPELRI) 175 MCG/3ML nebulizer solution USE 1 VIAL IN NEBULIZER DAILY 90 mL 5   sodium chloride HYPERTONIC 3 % nebulizer solution Take by nebulization 2 (two) times daily as needed for cough. Dx J44.9 750 mL 1   tamsulosin (FLOMAX) 0.4 MG CAPS capsule Take 0.4 mg by mouth in the morning.     temazepam (RESTORIL) 15 MG capsule Take 1 capsule (15 mg total) by mouth every evening. 90 capsule 0   torsemide (DEMADEX) 10 MG tablet Take 1 tablet (10 mg total) by mouth daily. 90 tablet 0   albuterol (PROVENTIL) (2.5 MG/3ML) 0.083% nebulizer solution Inhale 3 mLs (2.5 mg total) by nebulization every 4 (four) hours as needed for wheezing or shortness of breath. (Patient taking differently: Take 2.5 mg by nebulization See admin instructions. 2.5 mg via nebulization in the morning, midday, and evening) 75 mL 2   No facility-administered medications prior to visit.    ROS Review of Systems  Constitutional:  Positive for fatigue. Negative for appetite change, chills, diaphoresis, fever and unexpected weight change.  Eyes: Negative.   Respiratory:  Positive for cough, choking, shortness of breath and stridor. Negative for chest tightness and wheezing.    Cardiovascular:  Negative for chest pain, palpitations and leg swelling.  Gastrointestinal:  Negative for abdominal pain, constipation, diarrhea, nausea and vomiting.  Endocrine: Negative.   Genitourinary: Negative.  Negative for difficulty urinating.  Musculoskeletal:  Positive for arthralgias and gait problem.  Skin:  Negative for color change, pallor and rash.  Neurological:  Negative for dizziness and weakness.  Hematological:  Negative for adenopathy. Does not bruise/bleed easily.  Psychiatric/Behavioral: Negative.      Objective:  BP 112/68 (BP Location: Left Arm, Patient Position: Sitting, Cuff Size: Normal)   Pulse 82   Temp 97.6 F (36.4 C) (Temporal)   Ht 5\' 3"  (1.6 m)   Wt 113 lb (51.3 kg)   SpO2 96%   BMI 20.02 kg/m   BP Readings from Last 3  Encounters:  03/10/23 112/68  02/17/23 100/62  02/03/23 112/60    Wt Readings from Last 3 Encounters:  03/10/23 113 lb (51.3 kg)  02/17/23 109 lb 4.8 oz (49.6 kg)  02/03/23 110 lb (49.9 kg)    Physical Exam Vitals reviewed.  Constitutional:      General: She is not in acute distress.    Appearance: She is ill-appearing. She is not toxic-appearing or diaphoretic.  HENT:     Mouth/Throat:     Mouth: Mucous membranes are moist.  Eyes:     General: No scleral icterus.    Conjunctiva/sclera: Conjunctivae normal.  Cardiovascular:     Rate and Rhythm: Normal rate and regular rhythm.     Heart sounds: No murmur heard.    No friction rub. No gallop.  Pulmonary:     Effort: Tachypnea present.     Breath sounds: Examination of the right-middle field reveals rhonchi. Examination of the left-middle field reveals rhonchi. Examination of the right-lower field reveals rhonchi. Examination of the left-lower field reveals rhonchi. Rhonchi present. No decreased breath sounds, wheezing or rales.  Chest:     Chest wall: No tenderness.  Abdominal:     General: Abdomen is flat.     Palpations: There is no mass.     Tenderness:  There is no abdominal tenderness. There is no guarding.     Hernia: No hernia is present.  Musculoskeletal:        General: Normal range of motion.     Cervical back: Neck supple.     Right lower leg: No edema.     Left lower leg: No edema.  Lymphadenopathy:     Cervical: No cervical adenopathy.  Skin:    General: Skin is warm and dry.     Findings: Erythema present. No rash.  Neurological:     Mental Status: She is alert. Mental status is at baseline.  Psychiatric:        Mood and Affect: Mood normal.        Behavior: Behavior normal.     Lab Results  Component Value Date   WBC 8.4 03/10/2023   HGB 12.3 03/10/2023   HCT 38.7 03/10/2023   PLT 258.0 03/10/2023   GLUCOSE 65 (L) 03/10/2023   CHOL 169 01/14/2022   TRIG 157.0 (H) 01/14/2022   HDL 41.50 01/14/2022   LDLDIRECT 116.1 06/25/2010   LDLCALC 96 01/14/2022   ALT 10 11/26/2022   AST 17 11/26/2022   NA 137 03/10/2023   K 4.2 03/10/2023   CL 98 03/10/2023   CREATININE 0.88 03/10/2023   BUN 30 (H) 03/10/2023   CO2 32 03/10/2023   TSH 1.07 03/10/2023   INR 1.2 10/18/2022   HGBA1C 5.6 02/25/2013    CT CHEST HIGH RESOLUTION Result Date: 03/04/2023 CLINICAL DATA:  72 year old female with history of persistent chronic cough. Possible pneumonia. EXAM: CT CHEST WITHOUT CONTRAST TECHNIQUE: Multidetector CT imaging of the chest was performed following the standard protocol without intravenous contrast. High resolution imaging of the lungs, as well as inspiratory and expiratory imaging, was performed. RADIATION DOSE REDUCTION: This exam was performed according to the departmental dose-optimization program which includes automated exposure control, adjustment of the mA and/or kV according to patient size and/or use of iterative reconstruction technique. COMPARISON:  Chest CT 08/31/2022. FINDINGS: Cardiovascular: Heart size is normal. There is no significant pericardial fluid, thickening or pericardial calcification. There is  aortic atherosclerosis, as well as atherosclerosis of the great vessels of the mediastinum and  the coronary arteries, including calcified atherosclerotic plaque in the right coronary artery. Mediastinum/Nodes: Prominent AP window lymph node measuring up to 1.1 cm in short axis, similar to prior studies, presumably benign and reactive. No other definite pathologically enlarged mediastinal or hilar lymph nodes are noted. Esophagus is unremarkable in appearance. No axillary lymphadenopathy. Lungs/Pleura: High-resolution imaging demonstrates some patchy areas of ground-glass attenuation, septal thickening, thickening of the peribronchovascular interstitium and scattered areas of cylindrical bronchiectasis. No honeycombing noted. Bronchiectasis is most severe in the left lower lobe where there is extensive mucoid impaction and extensive peribronchovascular architectural distortion, likely to reflect areas of chronic post infectious or inflammatory scarring. Inspiratory and expiratory imaging demonstrates some mild air trapping indicative of mild small airways disease. Small amount of consolidation is noted in the left lower lobe suggesting active pneumonia. No pleural effusions. Upper Abdomen: Unremarkable. Musculoskeletal: There are no aggressive appearing lytic or blastic lesions noted in the visualized portions of the skeleton. IMPRESSION: 1. There is a spectrum of findings in the lungs considered most compatible with an alternative diagnosis (not usual interstitial pneumonia) per current ATS guidelines. 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 2. Airspace consolidation in the left lower lobe in the midst of an area of extensive bronchiectasis, indicative of pneumonia. 3. Mild air trapping indicative of mild small airways disease. 4. Aortic atherosclerosis, in addition to right coronary artery disease. Please note that although the presence of coronary artery calcium documents  the presence of coronary artery disease, the severity of this disease and any potential stenosis cannot be assessed on this non-gated CT examination. Assessment for potential risk factor modification, dietary therapy or pharmacologic therapy may be warranted, if clinically indicated. Aortic Atherosclerosis (ICD10-I70.0). Electronically Signed   By: Trudie Reed M.D.   On: 03/04/2023 06:48    Assessment & Plan:  Hypothyroidism- She is euthryoid. -     TSH; Future  Thrombocytosis - H/H and platelets are normal now. -     CBC with Differential/Platelet; Future -     IBC + Ferritin; Future  Purple toe syndrome of both feet (HCC) -     CBC with Differential/Platelet; Future -     IBC + Ferritin; Future  Hyponatremia - Na+ is normal. -     Basic metabolic panel; Future     Follow-up: Return in about 3 months (around 06/08/2023).  Sanda Linger, MD

## 2023-03-11 DIAGNOSIS — R260 Ataxic gait: Secondary | ICD-10-CM | POA: Diagnosis not present

## 2023-03-11 DIAGNOSIS — S42211D Unspecified displaced fracture of surgical neck of right humerus, subsequent encounter for fracture with routine healing: Secondary | ICD-10-CM | POA: Diagnosis not present

## 2023-03-11 DIAGNOSIS — R296 Repeated falls: Secondary | ICD-10-CM | POA: Diagnosis not present

## 2023-03-11 DIAGNOSIS — G35 Multiple sclerosis: Secondary | ICD-10-CM | POA: Diagnosis not present

## 2023-03-16 DIAGNOSIS — G35 Multiple sclerosis: Secondary | ICD-10-CM | POA: Diagnosis not present

## 2023-03-16 DIAGNOSIS — S42211D Unspecified displaced fracture of surgical neck of right humerus, subsequent encounter for fracture with routine healing: Secondary | ICD-10-CM | POA: Diagnosis not present

## 2023-03-16 DIAGNOSIS — R296 Repeated falls: Secondary | ICD-10-CM | POA: Diagnosis not present

## 2023-03-16 DIAGNOSIS — R260 Ataxic gait: Secondary | ICD-10-CM | POA: Diagnosis not present

## 2023-03-23 DIAGNOSIS — G35 Multiple sclerosis: Secondary | ICD-10-CM | POA: Diagnosis not present

## 2023-03-23 DIAGNOSIS — R296 Repeated falls: Secondary | ICD-10-CM | POA: Diagnosis not present

## 2023-03-23 DIAGNOSIS — S42211D Unspecified displaced fracture of surgical neck of right humerus, subsequent encounter for fracture with routine healing: Secondary | ICD-10-CM | POA: Diagnosis not present

## 2023-03-23 DIAGNOSIS — R260 Ataxic gait: Secondary | ICD-10-CM | POA: Diagnosis not present

## 2023-03-26 DIAGNOSIS — G35 Multiple sclerosis: Secondary | ICD-10-CM | POA: Diagnosis not present

## 2023-03-26 DIAGNOSIS — S42211D Unspecified displaced fracture of surgical neck of right humerus, subsequent encounter for fracture with routine healing: Secondary | ICD-10-CM | POA: Diagnosis not present

## 2023-03-26 DIAGNOSIS — R260 Ataxic gait: Secondary | ICD-10-CM | POA: Diagnosis not present

## 2023-03-26 DIAGNOSIS — R296 Repeated falls: Secondary | ICD-10-CM | POA: Diagnosis not present

## 2023-03-30 ENCOUNTER — Encounter: Payer: Self-pay | Admitting: Internal Medicine

## 2023-03-31 ENCOUNTER — Other Ambulatory Visit: Payer: Self-pay | Admitting: Allergy and Immunology

## 2023-04-01 ENCOUNTER — Other Ambulatory Visit (HOSPITAL_COMMUNITY): Payer: Self-pay

## 2023-04-01 MED ORDER — ALBUTEROL SULFATE (2.5 MG/3ML) 0.083% IN NEBU
2.5000 mg | INHALATION_SOLUTION | RESPIRATORY_TRACT | 1 refills | Status: DC
  Filled 2023-04-01: qty 180, 10d supply, fill #0

## 2023-04-02 ENCOUNTER — Other Ambulatory Visit: Payer: Self-pay | Admitting: Internal Medicine

## 2023-04-02 DIAGNOSIS — J449 Chronic obstructive pulmonary disease, unspecified: Secondary | ICD-10-CM

## 2023-04-02 DIAGNOSIS — E43 Unspecified severe protein-calorie malnutrition: Secondary | ICD-10-CM

## 2023-04-03 ENCOUNTER — Other Ambulatory Visit (HOSPITAL_COMMUNITY): Payer: Self-pay

## 2023-04-07 DIAGNOSIS — G35 Multiple sclerosis: Secondary | ICD-10-CM | POA: Diagnosis not present

## 2023-04-07 DIAGNOSIS — S42211D Unspecified displaced fracture of surgical neck of right humerus, subsequent encounter for fracture with routine healing: Secondary | ICD-10-CM | POA: Diagnosis not present

## 2023-04-07 DIAGNOSIS — R296 Repeated falls: Secondary | ICD-10-CM | POA: Diagnosis not present

## 2023-04-07 DIAGNOSIS — R260 Ataxic gait: Secondary | ICD-10-CM | POA: Diagnosis not present

## 2023-04-09 ENCOUNTER — Encounter: Payer: Self-pay | Admitting: Internal Medicine

## 2023-04-09 DIAGNOSIS — S42211D Unspecified displaced fracture of surgical neck of right humerus, subsequent encounter for fracture with routine healing: Secondary | ICD-10-CM | POA: Diagnosis not present

## 2023-04-09 DIAGNOSIS — G35 Multiple sclerosis: Secondary | ICD-10-CM | POA: Diagnosis not present

## 2023-04-09 DIAGNOSIS — R260 Ataxic gait: Secondary | ICD-10-CM | POA: Diagnosis not present

## 2023-04-09 DIAGNOSIS — R296 Repeated falls: Secondary | ICD-10-CM | POA: Diagnosis not present

## 2023-04-20 DIAGNOSIS — B351 Tinea unguium: Secondary | ICD-10-CM | POA: Diagnosis not present

## 2023-04-20 DIAGNOSIS — L814 Other melanin hyperpigmentation: Secondary | ICD-10-CM | POA: Diagnosis not present

## 2023-04-20 DIAGNOSIS — D1801 Hemangioma of skin and subcutaneous tissue: Secondary | ICD-10-CM | POA: Diagnosis not present

## 2023-04-20 DIAGNOSIS — B372 Candidiasis of skin and nail: Secondary | ICD-10-CM | POA: Diagnosis not present

## 2023-04-20 DIAGNOSIS — D2271 Melanocytic nevi of right lower limb, including hip: Secondary | ICD-10-CM | POA: Diagnosis not present

## 2023-04-20 DIAGNOSIS — R296 Repeated falls: Secondary | ICD-10-CM | POA: Diagnosis not present

## 2023-04-20 DIAGNOSIS — R260 Ataxic gait: Secondary | ICD-10-CM | POA: Diagnosis not present

## 2023-04-20 DIAGNOSIS — D225 Melanocytic nevi of trunk: Secondary | ICD-10-CM | POA: Diagnosis not present

## 2023-04-20 DIAGNOSIS — Z85828 Personal history of other malignant neoplasm of skin: Secondary | ICD-10-CM | POA: Diagnosis not present

## 2023-04-20 DIAGNOSIS — S42211D Unspecified displaced fracture of surgical neck of right humerus, subsequent encounter for fracture with routine healing: Secondary | ICD-10-CM | POA: Diagnosis not present

## 2023-04-20 DIAGNOSIS — G35 Multiple sclerosis: Secondary | ICD-10-CM | POA: Diagnosis not present

## 2023-04-20 DIAGNOSIS — L821 Other seborrheic keratosis: Secondary | ICD-10-CM | POA: Diagnosis not present

## 2023-04-20 DIAGNOSIS — D2272 Melanocytic nevi of left lower limb, including hip: Secondary | ICD-10-CM | POA: Diagnosis not present

## 2023-04-22 DIAGNOSIS — S42211D Unspecified displaced fracture of surgical neck of right humerus, subsequent encounter for fracture with routine healing: Secondary | ICD-10-CM | POA: Diagnosis not present

## 2023-04-22 DIAGNOSIS — R296 Repeated falls: Secondary | ICD-10-CM | POA: Diagnosis not present

## 2023-04-22 DIAGNOSIS — R260 Ataxic gait: Secondary | ICD-10-CM | POA: Diagnosis not present

## 2023-04-22 DIAGNOSIS — G35 Multiple sclerosis: Secondary | ICD-10-CM | POA: Diagnosis not present

## 2023-04-27 ENCOUNTER — Other Ambulatory Visit: Payer: Self-pay | Admitting: Internal Medicine

## 2023-04-27 DIAGNOSIS — F5104 Psychophysiologic insomnia: Secondary | ICD-10-CM

## 2023-04-28 DIAGNOSIS — G35 Multiple sclerosis: Secondary | ICD-10-CM | POA: Diagnosis not present

## 2023-04-28 DIAGNOSIS — S42211D Unspecified displaced fracture of surgical neck of right humerus, subsequent encounter for fracture with routine healing: Secondary | ICD-10-CM | POA: Diagnosis not present

## 2023-04-28 DIAGNOSIS — R296 Repeated falls: Secondary | ICD-10-CM | POA: Diagnosis not present

## 2023-04-28 DIAGNOSIS — R260 Ataxic gait: Secondary | ICD-10-CM | POA: Diagnosis not present

## 2023-04-30 DIAGNOSIS — S42211D Unspecified displaced fracture of surgical neck of right humerus, subsequent encounter for fracture with routine healing: Secondary | ICD-10-CM | POA: Diagnosis not present

## 2023-04-30 DIAGNOSIS — H35373 Puckering of macula, bilateral: Secondary | ICD-10-CM | POA: Diagnosis not present

## 2023-04-30 DIAGNOSIS — R260 Ataxic gait: Secondary | ICD-10-CM | POA: Diagnosis not present

## 2023-04-30 DIAGNOSIS — R296 Repeated falls: Secondary | ICD-10-CM | POA: Diagnosis not present

## 2023-04-30 DIAGNOSIS — G35 Multiple sclerosis: Secondary | ICD-10-CM | POA: Diagnosis not present

## 2023-05-05 DIAGNOSIS — R296 Repeated falls: Secondary | ICD-10-CM | POA: Diagnosis not present

## 2023-05-05 DIAGNOSIS — G35 Multiple sclerosis: Secondary | ICD-10-CM | POA: Diagnosis not present

## 2023-05-05 DIAGNOSIS — S42211D Unspecified displaced fracture of surgical neck of right humerus, subsequent encounter for fracture with routine healing: Secondary | ICD-10-CM | POA: Diagnosis not present

## 2023-05-05 DIAGNOSIS — R260 Ataxic gait: Secondary | ICD-10-CM | POA: Diagnosis not present

## 2023-05-06 ENCOUNTER — Other Ambulatory Visit: Payer: Self-pay | Admitting: Internal Medicine

## 2023-05-06 ENCOUNTER — Ambulatory Visit: Payer: Medicare Other | Admitting: Internal Medicine

## 2023-05-06 ENCOUNTER — Encounter: Payer: Self-pay | Admitting: Internal Medicine

## 2023-05-06 VITALS — BP 112/58 | HR 74 | Temp 98.1°F | Ht 63.0 in | Wt 118.0 lb

## 2023-05-06 DIAGNOSIS — Z8701 Personal history of pneumonia (recurrent): Secondary | ICD-10-CM | POA: Diagnosis not present

## 2023-05-06 DIAGNOSIS — G35 Multiple sclerosis: Secondary | ICD-10-CM

## 2023-05-06 DIAGNOSIS — J3802 Paralysis of vocal cords and larynx, bilateral: Secondary | ICD-10-CM

## 2023-05-06 DIAGNOSIS — R600 Localized edema: Secondary | ICD-10-CM

## 2023-05-06 DIAGNOSIS — J9611 Chronic respiratory failure with hypoxia: Secondary | ICD-10-CM | POA: Diagnosis not present

## 2023-05-06 NOTE — Progress Notes (Signed)
[name] consult 10/25/20  73 year old female with chronic cough.  She is a longstanding patient of Dr. Sandrea [name].  Since [id number] she has had multiple sclerosis.  She had a hip fracture in 2004 and after that she has used a walker.  In 2006 she had a EGD with 3 cm hiatal hernia.  She is always had a stridorous noise with deep inspiration.  She has a 15-year history of dysphagia.  2008 at esophagogram was normal.  In May 2022 she saw Dr. Christia [name] and was noticed to have bilateral vocal fold paralysis.  She was admitted 09/27/2020 with history of fever but also chronic cough for 4 months.  She had failed outpatient antibiotics and steroids.  CT chest showed some scattered groundglass opacities [personally visualized] particularly in the left lower lobe.  She had a blood gas 09/29/2020 without any hypercapnia.  She had a ESR of 44.  She had normal procalcitonin.  She had serology that was negative in July 2022.  During this time MRSA PCR, COVID and flu were negative.  Respiratory virus panel was also negative.  Blood cultures were no growth.  The urine Streptococcus and Legionella was negative in July 2022.  She was discharged on 09/29/2020 with outpatient follow-up with GI and pulmonary.  No antibiotics or steroids were prescribed.   At follow-up she presented with Dr. Claudette [name] 10/04/2020 and GI.  At this time she was already on Dexilant and Pepcid.  She underwent a swallow study on 10/15/2020 and it is unchanged compared to baseline.  He is able to swallow well without any aspiration using compensatory mechanisms.   She then presented via the ED on 10/23/2020 because of continued worsening of cough.  She also reports chronic bronchorrhea.  She coughs at least 10 times sputum and each time quite a bit.  It is clear in color.  Has never been measured in the sputum cup.  She complained of ongoing cough and ongoing acid reflux.  [GI endoscopy is pending at this time] she had a chest x-ray in the ED the  representative slice it is worsening compared to 1 month ago.  No CT scan available.  No leukocytosis no fever no hypoxemia.  Cough is worse when she lies flat.  EDP started her on a short course steroid orally.   She was then admitted by the hospitalist who is now consulted [place].  She is frustrated by her continued symptoms.   Noted that she has baseline anemia.  Her hemoglobin in May 2022 was 11.9 g%.  But July dropped to 10.2 g%.  And currently 7.3 g%.  She has worsening anemia. Results for Kayla [name], Kayla [name] (MRN [id number]) as of 10/24/2020 15:13   Ref. Range 09/27/2020 10:49 10/23/2020 13:28  Eosinophils Absolute Latest Ref Range: 0.0 - 0.5 K/uL 0.1 0.1   10/23/2020 -  8/3 CT chest - LLL consolidation and lingular infilktrare worse. Rt side infiltrates eter  8/4 - still with cough. D/w Dr Kayla [name] s - has mild insp stridor at rest due to bialteral VC paralysis. CT chest yesreday as above. PCT  < 0.1 ESR 80. Currently on pred 20mg  per day   OV NP 08/15/21  08/15/2021 Follow up : Asthma and Pneumonia  Patient returns for 1 month follow-up.  Over the last year patient has had recurrent episodes of pneumonia and bronchitis.  She was admitted in July 2022 for community-acquired pneumonia.  She was treated with IV antibiotics.  CT chest September 27, 2020  showed groundglass opacities in the right lower lobe and left lower lobe concerning for multifocal pneumonia.  She was readmitted in August 2022.  CT chest showed groundglass attenuation and waxing and waning groundglass.  Autoimmune work-up was negative.  She underwent bronchoscopy with negative fungal and AFB.  BAL cultures were positive for rare Streptococcus constellatus.  Sed rate was elevated at 80.  Treated with Antibiotics and Steroids for possible recurrent CAP vs BOOP/COP. Cytology showed reactive bronchial cells and benign lung tissue.  She was referred to immunology.  Her Ocrevus therapy for MS was stopped due to recurrent infections.  She received 8  weeks of Rocephin.  And she is now on weekly IVIG per immunology.  Patient has recently been reestablished with a new neurologist and is undergoing new testing with MRI. Patient is on Trelegy inhaler for her underlying asthma.  Patient's main complaint is she has ongoing cough and congestion. Patient has longstanding chronic cough upper airway irritation.  She has been evaluated by ENT Dr. Jenne [name].  She has known vocal fold paralysis, felt this is most likely due to her underlying MS.  She has intermittent stridor with this. Patient underwent modified barium swallow July 26 fifth 2022 that showed dysphagia in the pharyngeal phase.  No overt aspiration. Last visit patient was recommended on cough control regimen with Robitussin, Tessalon and flutter valve.  Patient says she has good and bad days.  Always has an ongoing cough that is quite productive at times.  We also talked about aspiration precautions. Last office visit . Sputum culture and AFB was not adequate  Sputum Fungal + heavy candida . and later  Mycocbaterium mucogenicum CT chest Jul 29, 2021 showed mild consolidation in the right upper lobe and patchy infiltrates in the mid to lower lung field on the left.  Frothy debris in the trachea and left mainstem bronchus questionable aspiration pneumonia.  She was treated with 1 week of Diflucan .  Patient denies any fever, discolored mucus.  Appetite is at baseline. We discussed aspiration precautions..   TEST/EVENTS :  CT chest September 27, 2020 showed groundglass opacities in the right lower lobe and left lower lobe concerning for multifocal pneumonia.  14 x 10 mm nodular density in the right middle lobe Modified barium swallow was negative   CT chest on August 3 showed groundglass attenuation consolidation with waxing and waning groundglass attenuation.  Autoimmune work-up was negative.  Fungal cultures were negative.  AFB sputum was negative.  BAL cultures showed rare Streptococcus Constellatus.   (Sensitive to ceftriaxone, levofloxacin and vancomycin.   intermediate to penicillin).  Sed rate was elevated at 80. Cytology showed reactive bronchial cells.  Benign lung tissue.    August 2022.  CT chest showed groundglass attenuation and waxing waning groundglass.  Autoimmune work-up was negative.  Fungal cultures AFB sputum was negative.  BAL cultures showed rare Streptococcus  Constellatus, sed rate was 80.  Cytology showed reactive bronchial cells and benign lung tissue.  Patient was referred to immunology   ENT consult 09/2015 -Kayla [name] - Vocal folds are immobile and in paramedian position. They are approximated such that voice comes out clearly but she has inspiratory stridor (ENT note - felt secondary most likely to MS    modified barium swallow July 26 fifth 2022 that showed dysphagia in the pharyngeal phase.  No overt aspiration.   CT chest Jul 29, 2021 mild apical scarring, mild consolidation in the medial aspect of the right upper lobe, patchy infiltrates in the  mid to lower left lung.   May 21, 2021 eosinophils 100   ID Consult June 2023  # Sputum cx positive for M mucogenicum/phocaicum # COPD, Cough Variant Asthma # Multiple sclerosis with associated B/L Vocal Cord Paralysis   09/28/20 respiratory cx normal respiratory flora 10/26/20 AFB smear and cx negative, strep constellatus , candida albicans ( tx with 8 weeks of IV ceftriaxone) 07/19/21 AFB smear and cx M mucogenicum/phocaicum, candida albicans ( tx 7 days of Fluconazole)   Does not meet criteria for Pulmonary NTM infection and hence, no indication to treat  OV 11/04/2021  Subjective:  Patient ID: Kayla [name], female , DOB: [birth year] , age 54 y.o. , MRN: [id number] , ADDRESS: [address] [place] [address] PCP [name], MD Patient Care Team: [name], MD as PCP - General (Internal Medicine) Harriette Bouillon, MD (Neurology) Kayla Reading, MD (Otolaryngology) Odette Fraction, MD as  Consulting Physician (Infectious Diseases)  This Provider for this visit: Treatment Team:  Attending Provider: Kalman Shan, MD    11/04/2021 -  Transfer of care from Dr Sherene Sires to Dr Marchelle Gearing Chief Complaint  Patient presents with   Follow-up    Pt states she is about the same since last visit. States she coughs all throughout the day and will cough up phlegm that ranges from white to yellow in color. States that she also has occasional wheezing.     HPI Kayla [name] 73 y.o. - she has bilateral vocal cord paralysius Follows with DR Kayla [name]. DEcades ago has seen Dr Gretta Cool at [facility] who recommended adipose injection but she did not enjoy the interaction. Cough been going on for many years. Ws on gbapntin for neuropathy for many years and stopped this year but this did not make any difference in cough. She has had GI eval . She has hx of potential aspiration. She has hx of PNA. LAst CT May 2023. She had PFT - she says they told her id c/w asthma but in my view  it is cw vocal cord paralysis. She has not had allergy testing. Feno is normal.  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   For her MS she is off therapy. -0  - [facility] doc [facility]   She says she is  immune suppresed and is on IvIG - Dr Loralyn Freshwater In [place]      OV 05/05/2022  Subjective:  Patient ID: Kayla [name], female , DOB: [birth year] , age 25 y.o. , MRN: [id number] , ADDRESS: [address] [place] [address] PCP [name], MD Patient Care Team: [name], MD as PCP - General (Internal Medicine) Kayla Reading, MD (Otolaryngology) Odette Fraction, MD as Consulting Physician (Infectious Diseases) Tyron Russell, MD (Neurology) Livingston Diones, Feliberto Gottron, MD (Allergy and Immunology)  This Provider for this visit: Treatment Team:  Attending Provider: Kalman Shan, MD    05/05/2022 -   Chief Complaint  Patient presents with   Follow-up    Pt is  here for follow up for cough and COPD. Pt states that the cough is the same and that she is doing well. Pt is using trelelgy daily with albuterol as needed    Associated asthma and vocal cord paralysis.  HPI Kayla [name] 73 y.o. -presents for follow-up.  I last saw her in August 2023.  After that she saw a nurse practitioner.  I referred her to Dr. Providence Crosby right because of chronic cough and bilateral vocal cord/fold  paralysis.  I reviewed Dr. Lynelle Doctor notes.  His assessment was known bilateral vocal fold immobility with small trans glottal airway and troublesome cough.  He recommended conservative line of treatment.  She states with his advised she is 25% better in terms of cough shortness of breath and dysphagia.  She says she is now learned to accept the residual 75% of symptoms will still be there.  She still has some chronic fatigue.  But overall no new issues.  She did have CT scan of the chest in October 2023 and I reviewed this result.  There are some debris in the left lower lobe.  She is willing to have a follow-up CT.        FENO  Lab Results  Component Value Date   NITRICOXIDE 14 11/04/2021    History of Present Illness 08/27/22 Kayla [name] is 73 y.o. never smoker  with history of recurrent pulmonary infiltrates in setting of MS and weakened immune system.  Recommendation was to perform video fiberoptic bronchoscopy with biopsies. She underwent Flexible video fiberoptic bronchoscopy and biopsies on 08/19/2022. She was prescribed Augmentin post bronchoscopy as Dr. Tonia Brooms felt she had a worsening pneumonia. Aerobic and anaerobic Culture showed abundant Strep Pneumoniae, BAL showed > 100,000 colonies. She states she had fever the day of her procedure and then yesterday.  Pt. States she has done well after Bronch. Cytology was negative for malignancy. Cultures were as noted above. She states recovery has been rough.She said all she did yesterday was sleep.She states her throat  also hurt, but she states that is getting better. She did not have any bleeding. She is compliant with her Augmentin treatment.She states she does have issues swallowing, but she had a swallow eval done last year which she states was normal. She has MS which may be contributing to her issues with swallow.  She states her secretions are thick and yellow. She is not currently using her mucinex She was taken off her Ocrevus for her MS 2 years ago, and she stopped her IVIG about 3 months ago. She is follows with Dr. Livingston Diones immunology. She has follow up with him in June.  She has very weak phonation and I worry that her swallow is weaker. We need to condsider repeating her swallow evaluation.    Observations/Objective: 08/19/2022 Path Review Acute inflammation and  Comment: reactive mesothelial cells. Negative for malignancy.   Aerobic/ anaerobic cultures >> Strep Pneumoniae, BAL showed > 100,000 colonies. Sensitive to Penicillins >> Pt. Was treated with Augmentin day of procedure.  AFB : Negative Body Fluid Neutrophils>> H at 92, Monocyte Macrophage L at 2  Assessment and Plan: Cytology 08/19/2022 Cytology negative for malignancy BAL + for Strep Pneumoniae,> 100,000 colonies. Plan Treated with Augmentin 08/19/2022 by Dr. Tonia Brooms day of procedure.   CT Chest 08/07/2022 Progressive mucoid impaction and peribronchovascular nodularity in the lower lobes, left greater than right, suggesting repeated episodes of aspiration. Atypical infectious etiology is not excluded. 2. Cylindrical bronchiectasis. 3.  Aortic atherosclerosis (ICD10-I70.0).  Follow Up Instructions: Strep Pneumonia Recently stopped IVIG injections ( 3 months ago) Plan  He has treated you with Augmentin through this coming Saturday 09/01/2022. Continue using Flutter valve as you have been doing.  Add Mucinex with a full glass of water in the mornings  Continue Trelegy daily as you have been doing. Rinse mouth after  use. See if you can move your appointment forward with Dr. Livingston Diones to get your IVIG resumed.  Follow up with Maralyn Sago  NP or Dr. Tonia Brooms, or Marchelle Gearing  next week in the [place] office with a CXR to ensure you are recovering from your pneumonia.  Call if you get worse not better. Consider repeat swallow eval. Please contact office for sooner follow up if symptoms do not improve or worsen or seek emergency care     xxxxxxxxxxxxxxxxxxxxxxxxxxxxxxxxxxxxxxxxxxxxxxxx[id number]   09/16/2022 Pt. Presents for hospital follow up. Upon arrival she was in respiratory distress.  Sats were  81% on RA .She was  audibly wheezing and coughing. She was gasping for air. She was using accessory muscles, nasal flaring was present, she had sternal retractions. She was not discharged with oxygen, and her sats have been very low( in the 70's)  for 24-48 hours per her husband. They have not been using albuterol nebs.  She was placed on oxygen 4L, sats initially responded and then dropped again. She is now on 6 L with sats of 96%. She was given a xopenex neb ( .63 mg/ 3cc's) She has a cough which is productive with brown secretions . She has been having chills for 7-8 days.  They have not checked her temperature. She did see her PCP last Tuesday and they had a discussion about Palliative care. I am concerned she has recurrent pneumonia. Breath sounds are diminished per right base and she is in bronchospasm.  She has been doing poorly over the last week. . She had run out of her albuterol, she did have a refill, but they were not aware. They have been using 3% saline. Pt. Husband endorses that they should have gone to the ED sooner, but they were hoping to get in to see immunology this week to resume her IVIG treatments.  EMS was called and patient has been transported to [facility] on oxygen with paramedic. She will need oxygen at discharge and she will also need a chest vest for mobilization of  secretions/ bronchiectasis. She need immunology to resume her IVIG infusions.   Test Results: Ig levels 12/22/19 >> IgA 51, IgG 289, IgM 27 (all low) PFT 10/24/21 >> FEV1 1.59 (72%), FEV1% 71, TLC 4.92 (100%), DLCO 60%, +BD CT chest 08/11/22 >> apical scarring, cylindrical BTX more in lower lobes, mucoid impaction and peribronchovascular nodularity in lower lobes Lt > Rt CT angio chest 08/31/22 >> debris in RLL bronchus, b/l lower lobe bronchial wall thickening with multifocal mucus plugging, scattered areas of GGO b/l, RLL area of consolidation   OV 12/18/2022  Subjective:  Patient ID: Kayla [name], female , DOB: April 28, 1950 , age 51 y.o. , MRN: [id number] , ADDRESS: [address] [place] [address] PCP [name], MD Patient Care Team: [name], MD as PCP - General (Internal Medicine) Kayla Reading, MD (Otolaryngology) Odette Fraction, MD as Consulting Physician (Infectious Diseases) Tyron Russell, MD (Neurology) Livingston Diones, Feliberto Gottron, MD (Allergy and Immunology)  This Provider for this visit: Treatment Team:  Attending Provider: Kalman Shan, MD    12/18/2022 -   Chief Complaint  Patient presents with   Follow-up    Dx with PNA in May 2024 and she states there is still pna present on last cxr done 12/09/22.  She has prod cough with white sputum. Breathing is stable as long as she is using her supplemental o2.      HPI Kayla [name] 73 y.o. -returns for follow-up.  After my last visit with her she has seen Dr. Tonia Brooms who did a bronchoscopy.  She is colonized with pneumococcus.  She then saw infectious diseases was recommended daily amoxicillin.  At this point she is taking daily amoxicillin since August 2024.  She is here with her husband who is an independent historian.  He tells me despite amoxicillin the cough continues.  She plans to see Dr. Barnie Alderman and ENT to see if there are any other measures for palliative relief.  Her last visit  with him per chart review was in December 2023.  She did get hospitalized between October 18, 2022 in October 24, 2018 for according to husband and also record review.  And this was for acute on chronic hypoxic respiratory failure due to recurrent streptococcal pneumonia revealing right upper lobe.  Most recent chest x-ray June December 09, 2022 does show right upper lobe infiltrate.  I discussed office check your chart with infectious disease Dr. Renold Don -he agreed with my recommendation to try azithromycin 250 mg once daily indefinitely.  Her QTc in July 2024 was normal.  She is on IVIG therapy for MS. She is now on oxygen 4 L since July 2024  She still able to dress shower and use a walker but things are progressively getting more difficult  After the hospitalization in August 2024 she had numbness in her feet.  This week she had EMG nerve conduction studies in [place].  She been diagnosed with superimposed DM Teola Bradley and the husband said he is actually relieved because the prognosis is not as bad as MS.   I discussed via secure chat with Dr. Rudy Jew 05/06/2023  Subjective:  Patient ID: Kayla [name], female , DOB: [birth year] , age 81 y.o. , MRN: [id number] , ADDRESS: [address] [place] [address] PCP [name], MD Patient Care Team: [name], MD as PCP - General (Internal Medicine) Kayla Reading, MD (Otolaryngology) Odette Fraction, MD as Consulting Physician (Infectious Diseases) Tyron Russell, MD (Neurology) Livingston Diones Feliberto Gottron, MD (Allergy and Immunology)  This Provider for this visit: Treatment Team:  Attending Provider: Kalman Shan, MD    05/06/2023 -   Chief Complaint  Patient presents with   Follow-up    Breathing has improved some since her last visit. She has had some wheezing and occ cough with white sputum.      HPI Kayla [name] 73 y.o. -presents for follow-up.  Presents with her husband.  Since the pneumonia in the summer 2024  she continues to be better.  November 2024 she had a CT scan of the chest that I personally visualized and looks remarkably better compared to the summer 2024.  She continues to be stable.  In the interim she did see Dr. Providence Crosby right and that he has advised tracheostomy.  She is hesitant but slowly coming around.  Husband has reassured her based on his impressions from talking to Dr. Delford Field that this is a relatively minor procedure.  I supported the conclusion.  They will be active maintenance which the husband's willing to do.  I did indicate that with progressive neuromuscular disease at some point tracheostomy would be required.  They verbalized understanding.  They want to wait till/7/25 when the youngest daughter Santina Evans is going to get married.  They are excited about that.  I supported that as well.  In terms of her quality of life neuromuscular weakness needing walker and significant cough continues.  This despite     Dr Gretta Cool Reflux Symptom Index (> 13- associated asthma and vocal cord paralysis.  15  suggestive of LPR cough) 0 -> 5  =  none ->severe problem 11/04/2021   Hoarseness of problem with voice 3  Clearing  Of Throat 3  Excess throat mucus or feeling of post nasal drip 5  Difficulty swallowing food, liquid or tablets 5  Cough after eating or lying down 5  Breathing difficulties or choking episodes 5  Troublesome or annoying cough 5  Sensation of something sticking in throat or lump in throat 5  Heartburn, chest pain, indigestion, or stomach acid coming up -  TOTAL 36    PFT     Latest Ref Rng & Units 10/24/2021    3:48 PM  PFT Results  FVC-Pre L 2.17   FVC-Predicted Pre % 74   FVC-Post L 2.22   FVC-Predicted Post % 76   Pre FEV1/FVC % % 50   Post FEV1/FCV % % 71   FEV1-Pre L 1.09   FEV1-Predicted Pre % 49   FEV1-Post L 1.59   DLCO uncorrected ml/min/mmHg 11.43   DLCO UNC% % 60   DLCO corrected ml/min/mmHg 11.43   DLCO COR %Predicted % 60   DLVA  Predicted % 83   TLC L 4.92   TLC % Predicted % 100   RV % Predicted % 135        LAB RESULTS last 96 hours No results found.       has a past medical history of Arthritis, Asthma, Bilateral vocal cord paralysis, Bronchiectasis (HCC), Cervical cancer (HCC) (2005), Depression, GERD (gastroesophageal reflux disease), Hiatal hernia, Hypothyroidism, Immunodeficiency (HCC), Insomnia, Multiple sclerosis (HCC) (1996), Osteoporosis, Pneumonia (2024, 2014, 1992), and Urine retention.   reports that she has never smoked. She has never used smokeless tobacco.  Past Surgical History:  Procedure Laterality Date   ABDOMINAL HYSTERECTOMY  03/25/2003   for cervical cancer, complete   BRONCHIAL BIOPSY  10/26/2020   Procedure: BRONCHIAL BIOPSIES;  Surgeon: Josephine Igo, DO;  Location: [facility];  Service: Pulmonary;;   BRONCHIAL BRUSHINGS  10/26/2020   Procedure: BRONCHIAL BRUSHINGS;  Surgeon: Josephine Igo, DO;  Location: [facility];  Service: Pulmonary;;   BRONCHIAL WASHINGS  10/26/2020   Procedure: BRONCHIAL WASHINGS;  Surgeon: Josephine Igo, DO;  Location: [facility];  Service: Pulmonary;;   BRONCHIAL WASHINGS  08/19/2022   Procedure: BRONCHIAL WASHINGS;  Surgeon: Josephine Igo, DO;  Location: [facility];  Service: Cardiopulmonary;;   CATARACT EXTRACTION, BILATERAL Bilateral    lens for cataracts   CONVERSION TO TOTAL HIP Right 09/18/2015   Procedure: CONVERSION OF RIGHT HEMI ARTHROPLASTY TO TOTAL HIP ARTHROPLASTY ACETABULAR REVISON ;  Surgeon: Durene Romans, MD;  Location: [facility];  Service: Orthopedics;  Laterality: Right;   HIP ARTHROPLASTY Right 03/01/2013   Procedure: ARTHROPLASTY BIPOLAR HIP;  Surgeon: Shelda Pal, MD;  Location: [facility];  Service: Orthopedics;  Laterality: Right;   HIP CLOSED REDUCTION Right 03/12/2013   Procedure: CLOSED REDUCTION HIP;  Surgeon: [name], MD;  Location: [facility];  Service: Orthopedics;  Laterality: Right;   HIP CLOSED  REDUCTION Right 03/19/2013   Procedure: CLOSED REDUCTION HIP;  Surgeon: Javier Docker, MD;  Location: [facility];  Service: Orthopedics;  Laterality: Right;   HIP CLOSED REDUCTION Right 11/10/2015   Procedure: CLOSED MANIPULATION HIP;  Surgeon: Samson Frederic, MD;  Location: [facility];  Service: Orthopedics;  Laterality: Right;   HIP CLOSED REDUCTION Right 06/05/2017   Procedure: CLOSED REDUCTION HIP;  Surgeon: Ranee Gosselin, MD;  Location: [facility];  Service: Orthopedics;  Laterality: Right;   SMALL INTESTINE SURGERY     TEE WITHOUT CARDIOVERSION N/A 09/22/2022   Procedure: TRANSESOPHAGEAL ECHOCARDIOGRAM;  Surgeon: Pricilla Riffle, MD;  Location: [facility] INVASIVE CV LAB;  Service: Cardiovascular;  Laterality: N/A;   TOTAL HIP REVISION Right 06/06/2017   Procedure: TOTAL HIP REVISION;  Surgeon: Ranee Gosselin, MD;  Location: [facility];  Service: Orthopedics;  Laterality: Right;   VESICOVAGINAL FISTULA CLOSURE W/ TAH  03/25/2003   VIDEO BRONCHOSCOPY N/A 10/26/2020   Procedure: VIDEO BRONCHOSCOPY WITH FLUORO;  Surgeon: Josephine Igo, DO;  [facility]: [facility];  Service: Pulmonary;  Laterality: N/A;   VIDEO BRONCHOSCOPY Left 08/19/2022   Procedure: VIDEO BRONCHOSCOPY WITH FLUORO; WITH BAL;  Surgeon: Josephine Igo, DO;  [facility]: [facility];  Service: Cardiopulmonary;  Laterality: Left;    Allergies  Allergen Reactions   Tramadol Other (See Comments)    Hallucinations    Contrast Media [Iodinated Contrast Media] Rash   Iohexol Hives   Morphine And Codeine Other (See Comments)    Hallucinations    Acetaminophen    Methylprednisolone Other (See Comments)    Decreases pts heart rate    Oxycodone    Oxycodone Hcl    Tizanidine Hcl    Hydrocodone Itching   Oxycodone-Acetaminophen Rash and Other (See Comments)    Hallucinations, also   Phenytoin Sodium Extended Rash   Zanaflex [Tizanidine Hydrochloride] Rash    Immunization History  Administered Date(s) Administered   DTP 04/10/2009   Fluad  Quad(high Dose 65+) 12/29/2018, 01/29/2022   Fluad Trivalent(High Dose 65+) 12/09/2022   Influenza Split 12/10/2010, 02/11/2012, 12/22/2012, 01/02/2014, 12/22/2014   Influenza Whole 02/19/2005, 03/28/2009, 01/23/2016   Influenza, High Dose Seasonal PF 02/23/2018   Influenza,inj,Quad PF,6+ Mos 02/10/2017   Influenza-Unspecified 12/02/2018, 01/08/2019, 12/22/2019, 01/15/2021, 02/05/2021   Moderna Covid-19 Vaccine Bivalent Booster 41yrs & up 02/05/2021, 01/20/2022   Moderna Sars-Covid-2 Vaccination 04/29/2019, 06/01/2019, 01/19/2020, 08/14/2020   PNEUMOCOCCAL CONJUGATE-20 10/22/2022   Pfizer(Comirnaty)Fall Seasonal Vaccine 12 years and older 09/23/2022   Pneumococcal Conjugate-13 11/06/2015   Pneumococcal Polysaccharide-23 03/28/2009, 04/26/2019   Respiratory Syncytial Virus Vaccine,Recomb Aduvanted(Arexvy) 12/26/2021   Tdap 04/26/2019, 06/19/2022   Zoster Recombinant(Shingrix) 09/07/2019, 02/17/2020    Family History  Problem Relation Age of Onset   Other Mother        COVID   Colon cancer Father    Atrial fibrillation Brother    Heart attack Maternal Grandfather    Parkinson's disease Paternal Grandfather    Esophageal cancer Neg Hx    Stomach cancer Neg Hx    Rectal cancer Neg Hx      Current Outpatient Medications:    albuterol (PROVENTIL) (2.5 MG/3ML) 0.083% nebulizer solution, Take 3 mLs (2.5 mg total) by nebulization every 4-6 hours as needed for shortness of breath and wheezing, Disp: 180 mL, Rfl: 1   azelastine (ASTELIN) 0.1 % nasal spray, Place 1 spray into both nostrils 2 (two) times daily. Use in each nostril as directed, Disp: 30 mL, Rfl: 1   azithromycin (ZITHROMAX) 250 MG tablet, Take 1 tablet (250 mg total) by mouth daily., Disp: 30 tablet, Rfl: 5   baclofen (LIORESAL) 10 MG tablet, Take 10 mg by mouth in the morning., Disp: , Rfl:    bisacodyl (DULCOLAX) 5 MG EC tablet, Take 5 mg by mouth See admin instructions. Take 5 mg by mouth in the morning and evening, Disp: ,  Rfl:    budesonide (PULMICORT) 0.5 MG/2ML nebulizer solution, USE 1 VIAL  IN  NEBULIZER TWICE  DAILY - Rinse Mouth After Treatment, Disp: 120 mL, Rfl: 5   calcium carbonate (OS-CAL - DOSED IN MG OF ELEMENTAL CALCIUM) 1250 (500 Ca) MG tablet, Take 1 tablet (1,250 mg total) by mouth daily with breakfast., Disp: , Rfl:    Coenzyme Q10 (VITALINE COQ10) 300 MG WAFR, Take 300 mg by mouth in the morning., Disp: , Rfl:    dexlansoprazole (DEXILANT) 60 MG capsule, TAKE 1 CAPSULE BY MOUTH ONCE DAILY WITH BREAKFAST (Patient taking differently: Take 60 mg by mouth in the morning.), Disp: 90 capsule, Rfl: 0   famotidine (PEPCID) 20 MG tablet, One after supper (Patient taking differently: Take 20 mg by mouth every evening.), Disp: 30 tablet, Rfl: 11   Ferric Maltol (ACCRUFER) 30 MG CAPS, Take 1 capsule (30 mg total) by mouth in the morning and at bedtime., Disp: 180 capsule, Rfl: 1   FLUoxetine (PROZAC) 40 MG capsule, Take 1 capsule (40 mg total) by mouth daily., Disp: 90 capsule, Rfl: 1   formoterol (PERFOROMIST) 20 MCG/2ML nebulizer solution, USE 1 VIAL  IN  NEBULIZER TWICE  DAILY - Morning and Evening, Disp: 120 mL, Rfl: 5   guaiFENesin (MUCINEX) 600 MG 12 hr tablet, Take 1 tablet (600 mg total) by mouth 2 (two) times daily. (Patient taking differently: Take 600 mg by mouth See admin instructions. Take 600 mg by mouth in the morning and evening), Disp: 30 tablet, Rfl: 0   levothyroxine (SYNTHROID) 100 MCG tablet, TAKE 1 TABLET BY MOUTH ONCE DAILY BEFORE BREAKFAST, Disp: 90 tablet, Rfl: 3   MAGNESIUM PO, Take 1 tablet by mouth every evening., Disp: , Rfl:    methenamine (HIPREX) 1 g tablet, Take 1 tablet (1 g total) by mouth 2 (two) times daily with a meal., Disp: 180 tablet, Rfl: 0   mirabegron ER (MYRBETRIQ) 50 MG TB24 tablet, Take 50 mg by mouth in the morning., Disp: , Rfl:    mirtazapine (REMERON) 15 MG tablet, TAKE ONE TABLET BY MOUTH AT BEDTIME, Disp: 90 tablet, Rfl: 1   NON FORMULARY, Take 1 tablet by  mouth See admin instructions. [employer] Mature Multivitamin with Calcium- Take 1 tablet by mouth with breakfast in the morning, Disp: , Rfl:    NON FORMULARY, Take 1 capsule by mouth See admin instructions. [employer] probiotic advanced  digestive probiotic- Take 1 capsule by mouth in the morning, Disp: , Rfl:    nystatin (MYCOSTATIN) 100000 UNIT/ML suspension, Swish, gargle, and swallow 5 mL after budesonide nebulizer treatment., Disp: 300 mL, Rfl: 5   OXYGEN, Inhale 2-3.5 L/min into the lungs continuous., Disp: , Rfl:    PRESCRIPTION MEDICATION, Apply 1 Device topically See admin instructions. Afflovest- For 30 minutes in the morning and evening- to begin @ 60% and work up to 80%, Disp: , Rfl:    promethazine-dextromethorphan (PROMETHAZINE-DM) 6.25-15 MG/5ML syrup, Take 2.73ml-5ml every 6 hours for cough, Disp: 240 mL, Rfl: 0   Respiratory Therapy Supplies (FLUTTER) DEVI, Use as directed (Patient taking differently: Inhale 1 Device into the lungs as directed.), Disp: 1 each, Rfl: 0   revefenacin (YUPELRI) 175 MCG/3ML nebulizer solution, USE 1 VIAL IN NEBULIZER DAILY, Disp: 90 mL, Rfl: 5   sodium chloride HYPERTONIC 3 % nebulizer solution, Take by nebulization 2 (two) times daily as needed for cough. Dx J44.9, Disp: 750 mL, Rfl: 1   tamsulosin (FLOMAX) 0.4 MG CAPS capsule, Take 0.4 mg by mouth in the morning., Disp: , Rfl:    temazepam (RESTORIL) 15 MG capsule, Take 1 capsule (15 mg  total) by mouth every evening., Disp: 90 capsule, Rfl: 0   torsemide (DEMADEX) 10 MG tablet, Take 1 tablet (10 mg total) by mouth daily., Disp: 90 tablet, Rfl: 0      Objective:   Vitals:   05/06/23 1353 05/06/23 1354  BP:  (!) 112/58  Pulse: 74   Temp:  98.1 F (36.7 C)  TempSrc:  Oral  SpO2: 98%   Weight:  118 lb (53.5 kg)  Height:  5\' 3"  (1.6 m)    Estimated body mass index is 20.9 kg/m as calculated from the following:   Height as of this encounter: 5\' 3"  (1.6 m).   Weight as of this encounter:  118 lb (53.5 kg).  @WEIGHTCHANGE @  [employer]   05/06/23 1354  Weight: 118 lb (53.5 kg)     Physical Exam   General: No distress. Looks same O2 at rest: no Cane present: no Sitting in wheel chair: no Frail: no Obese: no Neuro: Alert and Oriented x 3. GCS 15. Speech normal Psych: Pleasant Resp:  Barrel Chest - n.  Wheeze - o, Crackles - no, No overt respiratory distress CVS: Normal heart sounds. Murmurs - no Ext: Stigmata of Connective Tissue Disease - no HEENT: Normal upper airway. PEERL +. No post nasal drip        Assessment:       ICD-10-CM   1. Chronic respiratory failure with hypoxia (HCC)  J96.11     2. Bilateral vocal cord paralysis  J38.02     3. MS (multiple sclerosis) (HCC)  G35     4. History of recent pneumonia  Z87.01          Plan:     Patient Instructions     ICD-10-CM   1. Chronic respiratory failure with hypoxia (HCC)  J96.11     2. Bilateral vocal cord paralysis  J38.02     3. MS (multiple sclerosis) (HCC)  G35     4. History of recent pneumonia  Z87.01        Overall stable.  Noted new diagnosis of Almond Lint -since last visit Chest x-ray 12/09/2022 CT chest November 2024 significantly improved compared to June 2024/August 2024 but does have a new right upper lobe infiltrate  Plan -Support Dr. Providence Crosby right recommendation for tracheostomy -Rest of the management as before  Follow-up - 6 months or sooner if needed   FOLLOWUP Return in about 6 months (around 11/03/2023) for 15 min visit, with Dr Marchelle Gearing, Face to Face OR Video Visit.    SIGNATURE    Dr. Kalman Shan, M.D., F.C.C.P,  Pulmonary and Critical Care Medicine Staff Physician, [facility] Director - Interstitial Lung Disease  Program  Pulmonary Fibrosis [facility] at [facility] [place], [place], [address]  Pager: [phone], If no answer or between  15:00h - 7:00h: call [phone] Telephone: [phone]  2:19 PM 05/06/2023

## 2023-05-06 NOTE — Patient Instructions (Addendum)
ICD-10-CM   1. Chronic respiratory failure with hypoxia (HCC)  J96.11     2. Bilateral vocal cord paralysis  J38.02     3. MS (multiple sclerosis) (HCC)  G35     4. History of recent pneumonia  Z87.01        Overall stable.  Noted new diagnosis of Almond Lint -since last visit Chest x-ray 12/09/2022 CT chest November 2024 significantly improved compared to June 2024/August 2024 but does have a new right upper lobe infiltrate  Plan -Support Dr. Providence Crosby right recommendation for tracheostomy -Rest of the management as before  Follow-up - 6 months or sooner if needed

## 2023-05-11 DIAGNOSIS — R260 Ataxic gait: Secondary | ICD-10-CM | POA: Diagnosis not present

## 2023-05-11 DIAGNOSIS — S42211D Unspecified displaced fracture of surgical neck of right humerus, subsequent encounter for fracture with routine healing: Secondary | ICD-10-CM | POA: Diagnosis not present

## 2023-05-11 DIAGNOSIS — R296 Repeated falls: Secondary | ICD-10-CM | POA: Diagnosis not present

## 2023-05-11 DIAGNOSIS — G35 Multiple sclerosis: Secondary | ICD-10-CM | POA: Diagnosis not present

## 2023-05-12 ENCOUNTER — Encounter: Payer: Self-pay | Admitting: Allergy and Immunology

## 2023-05-12 ENCOUNTER — Ambulatory Visit (INDEPENDENT_AMBULATORY_CARE_PROVIDER_SITE_OTHER): Payer: Medicare Other | Admitting: Allergy and Immunology

## 2023-05-12 ENCOUNTER — Other Ambulatory Visit: Payer: Self-pay | Admitting: Allergy and Immunology

## 2023-05-12 ENCOUNTER — Other Ambulatory Visit: Payer: Self-pay

## 2023-05-12 VITALS — BP 116/68 | HR 79 | Temp 98.5°F | Resp 16 | Ht 63.0 in | Wt 118.0 lb

## 2023-05-12 DIAGNOSIS — K219 Gastro-esophageal reflux disease without esophagitis: Secondary | ICD-10-CM

## 2023-05-12 DIAGNOSIS — J3802 Paralysis of vocal cords and larynx, bilateral: Secondary | ICD-10-CM

## 2023-05-12 DIAGNOSIS — Z8679 Personal history of other diseases of the circulatory system: Secondary | ICD-10-CM | POA: Insufficient documentation

## 2023-05-12 DIAGNOSIS — D839 Common variable immunodeficiency, unspecified: Secondary | ICD-10-CM

## 2023-05-12 DIAGNOSIS — J479 Bronchiectasis, uncomplicated: Secondary | ICD-10-CM | POA: Diagnosis not present

## 2023-05-12 MED ORDER — FORMOTEROL FUMARATE 20 MCG/2ML IN NEBU: INHALATION_SOLUTION | RESPIRATORY_TRACT | 5 refills | Status: DC

## 2023-05-12 MED ORDER — ALBUTEROL SULFATE (2.5 MG/3ML) 0.083% IN NEBU: 2.5000 mg | INHALATION_SOLUTION | RESPIRATORY_TRACT | 1 refills | Status: DC

## 2023-05-12 MED ORDER — BUDESONIDE 0.5 MG/2ML IN SUSP: RESPIRATORY_TRACT | 5 refills | Status: DC

## 2023-05-12 NOTE — Patient Instructions (Addendum)
  1. Continue immunoglobulin infusions weekly - 9 gm (check blood next visit)  2.  Continue to treat inflammation of airway:   A. Budesonide 5 mg - nebulize 2 times per day  B. Formoterol - nebulize 2 times per day  C. Yupelri - nebulize 1 time per day  3. If needed:   A. Albuterol nebulization every 4-6 hours  B. Percussion vest few times per day to remove mucous  4. Treat reflux with the following:   A. Continue Dexilant and famotidine   5. Continue oxygen  6. Engage in exercise program  7. Influenza = Tamiflu. Covid = Paxlovid  8. Return to clinic in 12 weeks or earlier if problem

## 2023-05-12 NOTE — Progress Notes (Unsigned)
[place] - [place]   Follow-up Note  Referring Provider: [name], MD Primary Provider: [name], MD Date of Office Visit: 05/12/2023  Subjective:   Kayla [name] (DOB: [birth year]) is a 73 y.o. female who [facility] in re-evaluation of the following:  HPI: Kayla [name] returns to this clinic in evaluation of CVID treated with subcutaneous immunoglobulin, bronchiectasis, vocal cord paralysis, reflux, nocturnal deoxygenation.  I last saw her in this clinic 17 February 2023.  She is really doing quite well regarding infections of her airway.  While using her immunoglobulin infusion, without adverse effect, currently at 9 g every week ,she has not had any significant infections requiring an antibiotic.  She still has some chronic cough and throat clearing but overall her airway is doing really well.  It does not appear to be limiting her ability to exercise or sleep.  She rarely uses a short acting bronchodilator while she continues on a combination of budesonide and Perforomist and she is using her percussion vest about 1 time per day.  She believes her reflux is under very good control on her proton pump inhibitor and H2 receptor blocker.  She continues on oxygen at nighttime.  Allergies as of 05/12/2023       Reactions   Tramadol Other (See Comments)   Hallucinations   Tyloxapol Other (See Comments)   Contrast Media [iodinated Contrast Media] Rash   Iohexol Hives   Morphine And Codeine Other (See Comments)   Hallucinations    Acetaminophen    Methylprednisolone Other (See Comments)   Decreases pts heart rate    Oxycodone    Oxycodone Hcl    Tizanidine Hcl    Hydrocodone Itching   Oxycodone-acetaminophen Rash, Other (See Comments)   Hallucinations, also   Phenytoin Sodium Extended Rash   Zanaflex [tizanidine Hydrochloride] Rash        Medication List    ACCRUFeR 30 MG  Caps Generic drug: Ferric Maltol Take 1 capsule (30 mg total) by mouth in the morning and at bedtime.   albuterol (2.5 MG/3ML) 0.083% nebulizer solution Commonly known as: PROVENTIL Take 3 mLs (2.5 mg total) by nebulization every 4-6 hours as needed for shortness of breath and wheezing   azelastine 0.1 % nasal spray Commonly known as: ASTELIN Place 1 spray into both nostrils 2 (two) times daily. Use in each nostril as directed   azithromycin 250 MG tablet Commonly known as: ZITHROMAX Take 1 tablet (250 mg total) by mouth daily.   baclofen 10 MG tablet Commonly known as: LIORESAL Take 10 mg by mouth in the morning.   bisacodyl 5 MG EC tablet Commonly known as: DULCOLAX Take 5 mg by mouth See admin instructions. Take 5 mg by mouth in the morning and evening   budesonide 0.5 MG/2ML nebulizer solution Commonly known as: PULMICORT USE 1 VIAL  IN  NEBULIZER TWICE  DAILY - Rinse Mouth After Treatment   calcium carbonate 1250 (500 Ca) MG tablet Commonly known as: OS-CAL - dosed in mg of elemental calcium Take 1 tablet (1,250 mg total) by mouth daily with breakfast.   dexlansoprazole 60 MG capsule Commonly known as: DEXILANT TAKE 1 CAPSULE BY MOUTH ONCE DAILY WITH BREAKFAST   famotidine 20 MG tablet Commonly known as: Pepcid One after supper   FLUoxetine 40 MG capsule Commonly known as: PROZAC Take 1 capsule (40 mg total) by mouth daily.   Flutter Hardie Pulley  Use as directed   formoterol 20 MCG/2ML nebulizer solution Commonly known as: Perforomist USE 1 VIAL  IN  NEBULIZER TWICE  DAILY - Morning and Evening   gabapentin 100 MG capsule Commonly known as: NEURONTIN Take 100 mg by mouth 3 (three) times daily.   guaiFENesin 600 MG 12 hr tablet Commonly known as: MUCINEX Take 1 tablet (600 mg total) by mouth 2 (two) times daily.   levothyroxine 100 MCG tablet Commonly known as: SYNTHROID TAKE 1 TABLET BY MOUTH ONCE DAILY BEFORE BREAKFAST   MAGNESIUM PO Take 1 tablet by  mouth every evening.   methenamine 1 g tablet Commonly known as: HIPREX Take 1 tablet (1 g total) by mouth 2 (two) times daily with a meal.   mirtazapine 15 MG tablet Commonly known as: REMERON TAKE ONE TABLET BY MOUTH AT BEDTIME   Myrbetriq 50 MG Tb24 tablet Generic drug: mirabegron ER Take 50 mg by mouth in the morning.   NON FORMULARY Take 1 tablet by mouth See admin instructions. [employer] Mature Multivitamin with Calcium- Take 1 tablet by mouth with breakfast in the morning   NON FORMULARY Take 1 capsule by mouth See admin instructions. [employer] probiotic advanced  digestive probiotic- Take 1 capsule by mouth in the morning   nystatin 100000 UNIT/ML suspension Commonly known as: MYCOSTATIN Swish, gargle, and swallow 5 mL after budesonide nebulizer treatment.   OXYGEN Inhale 2-3.5 L/min into the lungs continuous.   PRESCRIPTION MEDICATION Apply 1 Device topically See admin instructions. Afflovest- For 30 minutes in the morning and evening- to begin @ 60% and work up to 80%   promethazine-dextromethorphan 6.25-15 MG/5ML syrup Commonly known as: PROMETHAZINE-DM Take 2.54ml-5ml every 6 hours for cough   sodium chloride HYPERTONIC 3 % nebulizer solution Take by nebulization 2 (two) times daily as needed for cough. Dx J44.9   tamsulosin 0.4 MG Caps capsule Commonly known as: FLOMAX Take 0.4 mg by mouth in the morning.   temazepam 15 MG capsule Commonly known as: RESTORIL Take 1 capsule (15 mg total) by mouth every evening.   torsemide 10 MG tablet Commonly known as: DEMADEX Take 1 tablet (10 mg total) by mouth daily.   Vitaline CoQ10 300 MG Wafr Generic drug: Coenzyme Q10 Take 300 mg by mouth in the morning.   Yupelri 175 MCG/3ML nebulizer solution Generic drug: revefenacin USE 1 VIAL IN NEBULIZER DAILY    Past Medical History:  Diagnosis Date   Arthritis    oa   Asthma    Bilateral vocal cord paralysis    Bronchiectasis (HCC)    Cervical cancer  (HCC) 2005   Depression    GERD (gastroesophageal reflux disease)    Hiatal hernia    Hypothyroidism    Immunodeficiency (HCC)    Gets IVIG infusions   Insomnia    Multiple sclerosis (HCC) 1996   Osteoporosis    Pneumonia 2024, 2014, 1992   Urine retention     Past Surgical History:  Procedure Laterality Date   ABDOMINAL HYSTERECTOMY  03/25/2003   for cervical cancer, complete   BRONCHIAL BIOPSY  10/26/2020   Procedure: BRONCHIAL BIOPSIES;  Surgeon: Josephine Igo, DO;  [facility]: [facility];  Service: Pulmonary;;   BRONCHIAL BRUSHINGS  10/26/2020   Procedure: BRONCHIAL BRUSHINGS;  Surgeon: Josephine Igo, DO;  [facility]: [facility];  Service: Pulmonary;;   BRONCHIAL WASHINGS  10/26/2020   Procedure: BRONCHIAL WASHINGS;  Surgeon: Josephine Igo, DO;  [facility];  Service: Pulmonary;;   BRONCHIAL WASHINGS  08/19/2022   Procedure: BRONCHIAL WASHINGS;  Surgeon: Josephine Igo, DO;  [facility];  Service: Cardiopulmonary;;   CATARACT EXTRACTION, BILATERAL Bilateral    lens for cataracts   CONVERSION TO TOTAL HIP Right 09/18/2015   Procedure: CONVERSION OF RIGHT HEMI ARTHROPLASTY TO TOTAL HIP ARTHROPLASTY ACETABULAR REVISON ;  Surgeon: Durene Romans, MD;  Location: [facility];  Service: Orthopedics;  Laterality: Right;   HIP ARTHROPLASTY Right 03/01/2013   Procedure: ARTHROPLASTY BIPOLAR HIP;  Surgeon: Shelda Pal, MD;  Location: [facility];  Service: Orthopedics;  Laterality: Right;   HIP CLOSED REDUCTION Right 03/12/2013   Procedure: CLOSED REDUCTION HIP;  Surgeon: [name], MD;  [facility];  Service: Orthopedics;  Laterality: Right;   HIP CLOSED REDUCTION Right 03/19/2013   Procedure: CLOSED REDUCTION HIP;  Surgeon: Javier Docker, MD;  [facility];  Service: Orthopedics;  Laterality: Right;   HIP CLOSED REDUCTION Right 11/10/2015   Procedure: CLOSED MANIPULATION HIP;  Surgeon: Samson Frederic, MD;  Location: [facility];  Service: Orthopedics;   Laterality: Right;   HIP CLOSED REDUCTION Right 06/05/2017   Procedure: CLOSED REDUCTION HIP;  Surgeon: Ranee Gosselin, MD;  Location: [facility];  Service: Orthopedics;  Laterality: Right;   SMALL INTESTINE SURGERY     TEE WITHOUT CARDIOVERSION N/A 09/22/2022   Procedure: TRANSESOPHAGEAL ECHOCARDIOGRAM;  Surgeon: Pricilla Riffle, MD;  Location: [facility] INVASIVE CV LAB;  Service: Cardiovascular;  Laterality: N/A;   TOTAL HIP REVISION Right 06/06/2017   Procedure: TOTAL HIP REVISION;  Surgeon: Ranee Gosselin, MD;  [facility]: [facility];  Service: Orthopedics;  Laterality: Right;   VESICOVAGINAL FISTULA CLOSURE W/ TAH  03/25/2003   VIDEO BRONCHOSCOPY N/A 10/26/2020   Procedure: VIDEO BRONCHOSCOPY WITH FLUORO;  Surgeon: Josephine Igo, DO;  [facility];  Service: Pulmonary;  Laterality: N/A;   VIDEO BRONCHOSCOPY Left 08/19/2022   Procedure: VIDEO BRONCHOSCOPY WITH FLUORO; WITH BAL;  Surgeon: Josephine Igo, DO;  [facility];  Service: Cardiopulmonary;  Laterality: Left;    Review of systems negative except as noted in HPI / PMHx or noted below:  Review of Systems  Constitutional: Negative.   HENT: Negative.    Eyes: Negative.   Respiratory: Negative.    Cardiovascular: Negative.   Gastrointestinal: Negative.   Genitourinary: Negative.   Musculoskeletal: Negative.   Skin: Negative.   Neurological: Negative.   Endo/Heme/Allergies: Negative.   Psychiatric/Behavioral: Negative.       Objective:   Vitals:   05/12/23 1506  BP: 116/68  Pulse: 79  Resp: 16  Temp: 98.5 F (36.9 C)  SpO2: 97%   Height: 5\' 3"  (160 cm)  Weight: 118 lb (53.5 kg)   Physical Exam Constitutional:      Appearance: She is not diaphoretic.  HENT:     Head: Normocephalic.     Right Ear: Tympanic membrane, ear canal and external ear normal.     Left Ear: Tympanic membrane, ear canal and external ear normal.     Nose: Nose normal. No mucosal edema or rhinorrhea.     Mouth/Throat:     Pharynx:  Uvula midline. No oropharyngeal exudate.  Eyes:     Conjunctiva/sclera: Conjunctivae normal.  Neck:     Thyroid: No thyromegaly.     Trachea: Trachea normal. No tracheal tenderness or tracheal deviation.  Cardiovascular:     Rate and Rhythm: Normal rate and regular rhythm.     Heart sounds: Normal heart sounds, S1 normal and S2 normal. No  murmur heard. Pulmonary:     Effort: No respiratory distress.     Breath sounds: Normal breath sounds. No stridor. No wheezing or rales.  Lymphadenopathy:     Head:     Right side of head: No tonsillar adenopathy.     Left side of head: No tonsillar adenopathy.     Cervical: No cervical adenopathy.  Skin:    Findings: No erythema or rash.     Nails: There is no clubbing.  Neurological:     Mental Status: She is alert.     Diagnostics: Results of blood tests obtained 17 February 2023 identifies IgG 1205 Mg/DL, IgM 27 Mg/DL, IgA 46 Mg/DL  Assessment and Plan:   1. CVID (common variable immunodeficiency) (HCC)   2. Bronchiectasis without complication (HCC)   3. Bilateral vocal cord paralysis   4. Gastroesophageal reflux disease, unspecified whether esophagitis present    1. Continue immunoglobulin infusions weekly - 9 gm (check blood next visit)  2.  Continue to treat inflammation of airway:   A. Budesonide 5 mg - nebulize 2 times per day  B. Formoterol - nebulize 2 times per day  C. Yupelri - nebulize 1 time per day  3. If needed:   A. Albuterol nebulization every 4-6 hours  B. Percussion vest few times per day to remove mucous  4. Treat reflux with the following:   A. Continue Dexilant and famotidine   5. Continue oxygen  6. Engage in exercise program  7. Influenza = Tamiflu. Covid = Paxlovid  8. Return to clinic in 12 weeks or earlier if problem  Simar appears to be doing pretty good at this point in time while utilizing therapy to correct her deficient immune system and therapy directed against respiratory tract  inflammation and reflux and she will continue on this plan and we will see her back in this clinic in 12 weeks or earlier if there is a problem.  Laurette Schimke, MD Allergy / Immunology [facility]

## 2023-05-13 ENCOUNTER — Encounter: Payer: Self-pay | Admitting: Allergy and Immunology

## 2023-05-13 DIAGNOSIS — R260 Ataxic gait: Secondary | ICD-10-CM | POA: Diagnosis not present

## 2023-05-13 DIAGNOSIS — G35 Multiple sclerosis: Secondary | ICD-10-CM | POA: Diagnosis not present

## 2023-05-13 DIAGNOSIS — S42211D Unspecified displaced fracture of surgical neck of right humerus, subsequent encounter for fracture with routine healing: Secondary | ICD-10-CM | POA: Diagnosis not present

## 2023-05-13 DIAGNOSIS — R296 Repeated falls: Secondary | ICD-10-CM | POA: Diagnosis not present

## 2023-05-20 DIAGNOSIS — R260 Ataxic gait: Secondary | ICD-10-CM | POA: Diagnosis not present

## 2023-05-20 DIAGNOSIS — S42211D Unspecified displaced fracture of surgical neck of right humerus, subsequent encounter for fracture with routine healing: Secondary | ICD-10-CM | POA: Diagnosis not present

## 2023-05-20 DIAGNOSIS — G35 Multiple sclerosis: Secondary | ICD-10-CM | POA: Diagnosis not present

## 2023-05-20 DIAGNOSIS — R296 Repeated falls: Secondary | ICD-10-CM | POA: Diagnosis not present

## 2023-05-24 ENCOUNTER — Other Ambulatory Visit: Payer: Self-pay | Admitting: Internal Medicine

## 2023-05-24 DIAGNOSIS — K2100 Gastro-esophageal reflux disease with esophagitis, without bleeding: Secondary | ICD-10-CM

## 2023-05-24 DIAGNOSIS — R1314 Dysphagia, pharyngoesophageal phase: Secondary | ICD-10-CM

## 2023-05-25 DIAGNOSIS — R296 Repeated falls: Secondary | ICD-10-CM | POA: Diagnosis not present

## 2023-05-25 DIAGNOSIS — S42211D Unspecified displaced fracture of surgical neck of right humerus, subsequent encounter for fracture with routine healing: Secondary | ICD-10-CM | POA: Diagnosis not present

## 2023-05-25 DIAGNOSIS — R260 Ataxic gait: Secondary | ICD-10-CM | POA: Diagnosis not present

## 2023-05-25 DIAGNOSIS — G35 Multiple sclerosis: Secondary | ICD-10-CM | POA: Diagnosis not present

## 2023-05-27 DIAGNOSIS — R260 Ataxic gait: Secondary | ICD-10-CM | POA: Diagnosis not present

## 2023-05-27 DIAGNOSIS — G35 Multiple sclerosis: Secondary | ICD-10-CM | POA: Diagnosis not present

## 2023-05-27 DIAGNOSIS — S42211D Unspecified displaced fracture of surgical neck of right humerus, subsequent encounter for fracture with routine healing: Secondary | ICD-10-CM | POA: Diagnosis not present

## 2023-05-27 DIAGNOSIS — R296 Repeated falls: Secondary | ICD-10-CM | POA: Diagnosis not present

## 2023-06-01 DIAGNOSIS — S42211D Unspecified displaced fracture of surgical neck of right humerus, subsequent encounter for fracture with routine healing: Secondary | ICD-10-CM | POA: Diagnosis not present

## 2023-06-01 DIAGNOSIS — R260 Ataxic gait: Secondary | ICD-10-CM | POA: Diagnosis not present

## 2023-06-01 DIAGNOSIS — G35 Multiple sclerosis: Secondary | ICD-10-CM | POA: Diagnosis not present

## 2023-06-01 DIAGNOSIS — R296 Repeated falls: Secondary | ICD-10-CM | POA: Diagnosis not present

## 2023-06-03 DIAGNOSIS — S42211D Unspecified displaced fracture of surgical neck of right humerus, subsequent encounter for fracture with routine healing: Secondary | ICD-10-CM | POA: Diagnosis not present

## 2023-06-03 DIAGNOSIS — R260 Ataxic gait: Secondary | ICD-10-CM | POA: Diagnosis not present

## 2023-06-03 DIAGNOSIS — R296 Repeated falls: Secondary | ICD-10-CM | POA: Diagnosis not present

## 2023-06-03 DIAGNOSIS — G35 Multiple sclerosis: Secondary | ICD-10-CM | POA: Diagnosis not present

## 2023-06-04 DIAGNOSIS — D849 Immunodeficiency, unspecified: Secondary | ICD-10-CM | POA: Diagnosis not present

## 2023-06-04 DIAGNOSIS — G35 Multiple sclerosis: Secondary | ICD-10-CM | POA: Diagnosis not present

## 2023-06-04 DIAGNOSIS — D8989 Other specified disorders involving the immune mechanism, not elsewhere classified: Secondary | ICD-10-CM | POA: Diagnosis not present

## 2023-06-04 DIAGNOSIS — G8220 Paraplegia, unspecified: Secondary | ICD-10-CM | POA: Diagnosis not present

## 2023-06-04 DIAGNOSIS — G9332 Myalgic encephalomyelitis/chronic fatigue syndrome: Secondary | ICD-10-CM | POA: Diagnosis not present

## 2023-06-04 DIAGNOSIS — J449 Chronic obstructive pulmonary disease, unspecified: Secondary | ICD-10-CM | POA: Diagnosis not present

## 2023-06-08 ENCOUNTER — Encounter: Payer: Self-pay | Admitting: Internal Medicine

## 2023-06-08 ENCOUNTER — Ambulatory Visit (INDEPENDENT_AMBULATORY_CARE_PROVIDER_SITE_OTHER): Payer: Medicare Other | Admitting: Internal Medicine

## 2023-06-08 ENCOUNTER — Ambulatory Visit: Payer: Medicare Other | Admitting: Internal Medicine

## 2023-06-08 VITALS — BP 116/68 | HR 70 | Temp 98.0°F | Ht 63.0 in | Wt 120.0 lb

## 2023-06-08 DIAGNOSIS — E038 Other specified hypothyroidism: Secondary | ICD-10-CM

## 2023-06-08 DIAGNOSIS — J9611 Chronic respiratory failure with hypoxia: Secondary | ICD-10-CM | POA: Diagnosis not present

## 2023-06-08 DIAGNOSIS — Z1159 Encounter for screening for other viral diseases: Secondary | ICD-10-CM | POA: Diagnosis not present

## 2023-06-08 DIAGNOSIS — E871 Hypo-osmolality and hyponatremia: Secondary | ICD-10-CM | POA: Diagnosis not present

## 2023-06-08 DIAGNOSIS — D801 Nonfamilial hypogammaglobulinemia: Secondary | ICD-10-CM | POA: Diagnosis not present

## 2023-06-08 LAB — LIPID PANEL
Cholesterol: 198 mg/dL (ref 0–200)
HDL: 74.4 mg/dL (ref >39.00)
LDL Cholesterol: 109 mg/dL — ABNORMAL HIGH (ref 0–99)
NonHDL: 123.82
Total CHOL/HDL Ratio: 3
Triglycerides: 72 mg/dL (ref 0.0–149.0)
VLDL: 14.4 mg/dL (ref 0.0–40.0)

## 2023-06-08 LAB — CBC WITH DIFFERENTIAL/PLATELET
Basophils Absolute: 0.1 10*3/uL (ref 0.0–0.1)
Basophils Relative: 0.8 % (ref 0.0–3.0)
Eosinophils Absolute: 0.1 10*3/uL (ref 0.0–0.7)
Eosinophils Relative: 1.4 % (ref 0.0–5.0)
HCT: 38.6 % (ref 36.0–46.0)
Hemoglobin: 12.6 g/dL (ref 12.0–15.0)
Lymphocytes Relative: 36 % (ref 12.0–46.0)
Lymphs Abs: 2.3 10*3/uL (ref 0.7–4.0)
MCHC: 32.7 g/dL (ref 30.0–36.0)
MCV: 88.8 fl (ref 78.0–100.0)
Monocytes Absolute: 0.6 10*3/uL (ref 0.1–1.0)
Monocytes Relative: 8.7 % (ref 3.0–12.0)
Neutro Abs: 3.5 10*3/uL (ref 1.4–7.7)
Neutrophils Relative %: 53.1 % (ref 43.0–77.0)
Platelets: 272 10*3/uL (ref 150.0–400.0)
RBC: 4.35 Mil/uL (ref 3.87–5.11)
RDW: 13.9 % (ref 11.5–15.5)
WBC: 6.5 10*3/uL (ref 4.0–10.5)

## 2023-06-08 LAB — BASIC METABOLIC PANEL
BUN: 33 mg/dL — ABNORMAL HIGH (ref 6–23)
CO2: 32 meq/L (ref 19–32)
Calcium: 9.8 mg/dL (ref 8.4–10.5)
Chloride: 95 meq/L — ABNORMAL LOW (ref 96–112)
Creatinine, Ser: 0.86 mg/dL (ref 0.40–1.20)
GFR: 68.23 mL/min (ref >60.00)
Glucose, Bld: 91 mg/dL (ref 70–99)
Potassium: 4.7 meq/L (ref 3.5–5.1)
Sodium: 135 meq/L (ref 135–145)

## 2023-06-08 NOTE — Progress Notes (Unsigned)
 Subjective:  Patient ID: Kayla [name], female    DOB: 06/10/1950  Age: 73 y.o. MRN: [id number]  CC: Hypothyroidism   HPI Kayla [name] presents for f/up ----  Discussed the use of AI scribe software for clinical note transcription with the patient, who gave verbal consent to proceed.  History of Present Illness   Kayla [name] is a 73 year old female with multiple sclerosis who presents with fatigue and balance issues. She is accompanied by her husband, Homero Fellers.  She experiences extreme fatigue and difficulty standing on her own, significantly impacting her ability to cook and perform daily activities. She has been off Adderall for over a year but recently restarted it at a dose of 20 mg twice a day to help with fatigue. It has only been a couple of days since she resumed the medication, and she is waiting to see if it makes a difference.  She attends physical therapy twice a week but finds it challenging to move afterward due to fatigue. She is not currently on any specific treatment for multiple sclerosis other than IVIG, which she states is not causing her sodium levels to plummet anymore.  Regarding her respiratory status, she does not use oxygen during the day but continues to use it at night. She experiences a persistent cough that interferes with her sleep and has tried various treatments without success. No fevers, chills, or night sweats are present, and she feels that her pneumonia has cleared up, although a CT scan still shows some residual changes.  Her weight has increased to 120 pounds, which she attributes to an improved appetite and continued use of appetite medication. She inquires about discontinuing the appetite medication but is advised against it to maintain her current weight.       Outpatient Medications Prior to Visit  Medication Sig Dispense Refill   albuterol (PROVENTIL) (2.5 MG/3ML) 0.083% nebulizer solution Take 3 mLs (2.5 mg total) by nebulization every 4-6  hours as needed for shortness of breath and wheezing 180 mL 1   amphetamine-dextroamphetamine (ADDERALL) 20 MG tablet Take 20 mg by mouth 2 (two) times daily.     azelastine (ASTELIN) 0.1 % nasal spray Place 1 spray into both nostrils 2 (two) times daily. Use in each nostril as directed 30 mL 1   azithromycin (ZITHROMAX) 250 MG tablet Take 1 tablet (250 mg total) by mouth daily. 30 tablet 5   baclofen (LIORESAL) 10 MG tablet Take 10 mg by mouth in the morning.     bisacodyl (DULCOLAX) 5 MG EC tablet Take 5 mg by mouth See admin instructions. Take 5 mg by mouth in the morning and evening     budesonide (PULMICORT) 0.5 MG/2ML nebulizer solution USE 1 VIAL  IN  NEBULIZER TWICE  DAILY - Rinse Mouth After Treatment 120 mL 5   calcium carbonate (OS-CAL - DOSED IN MG OF ELEMENTAL CALCIUM) 1250 (500 Ca) MG tablet Take 1 tablet (1,250 mg total) by mouth daily with breakfast.     Coenzyme Q10 (VITALINE COQ10) 300 MG WAFR Take 300 mg by mouth in the morning.     dexlansoprazole (DEXILANT) 60 MG capsule TAKE 1 CAPSULE BY MOUTH ONCE DAILY WITH BREAKFAST 90 capsule 0   famotidine (PEPCID) 20 MG tablet One after supper (Patient taking differently: Take 20 mg by mouth every evening.) 30 tablet 11   Ferric Maltol (ACCRUFER) 30 MG CAPS Take 1 capsule (30 mg total) by mouth in the morning and at bedtime. 180  capsule 1   FLUoxetine (PROZAC) 40 MG capsule Take 1 capsule (40 mg total) by mouth daily. 90 capsule 1   formoterol (PERFOROMIST) 20 MCG/2ML nebulizer solution USE 1 VIAL  IN  NEBULIZER TWICE  DAILY - Morning and Evening 120 mL 5   gabapentin (NEURONTIN) 100 MG capsule Take 100 mg by mouth 3 (three) times daily.     guaiFENesin (MUCINEX) 600 MG 12 hr tablet Take 1 tablet (600 mg total) by mouth 2 (two) times daily. (Patient taking differently: Take 600 mg by mouth See admin instructions. Take 600 mg by mouth in the morning and evening) 30 tablet 0   levothyroxine (SYNTHROID) 100 MCG tablet TAKE 1 TABLET BY MOUTH  ONCE DAILY BEFORE BREAKFAST 90 tablet 3   MAGNESIUM PO Take 1 tablet by mouth every evening.     methenamine (HIPREX) 1 g tablet Take 1 tablet (1 g total) by mouth 2 (two) times daily with a meal. 180 tablet 0   mirabegron ER (MYRBETRIQ) 50 MG TB24 tablet Take 50 mg by mouth in the morning.     mirtazapine (REMERON) 15 MG tablet TAKE ONE TABLET BY MOUTH AT BEDTIME 90 tablet 1   NON FORMULARY Take 1 tablet by mouth See admin instructions. [employer] Mature Multivitamin with Calcium- Take 1 tablet by mouth with breakfast in the morning     NON FORMULARY Take 1 capsule by mouth See admin instructions. [employer] probiotic advanced  digestive probiotic- Take 1 capsule by mouth in the morning     nystatin (MYCOSTATIN) 100000 UNIT/ML suspension Swish, gargle, and swallow 5 mL after budesonide nebulizer treatment. 300 mL 5   OXYGEN Inhale 2-3.5 L/min into the lungs continuous.     PRESCRIPTION MEDICATION Apply 1 Device topically See admin instructions. Afflovest- For 30 minutes in the morning and evening- to begin @ 60% and work up to 80%     Respiratory Therapy Supplies (FLUTTER) DEVI Use as directed (Patient taking differently: Inhale 1 Device into the lungs as directed.) 1 each 0   revefenacin (YUPELRI) 175 MCG/3ML nebulizer solution USE 1 VIAL IN NEBULIZER DAILY 90 mL 5   sodium chloride HYPERTONIC 3 % nebulizer solution Take by nebulization 2 (two) times daily as needed for cough. Dx J44.9 750 mL 1   tamsulosin (FLOMAX) 0.4 MG CAPS capsule Take 0.4 mg by mouth in the morning.     temazepam (RESTORIL) 15 MG capsule Take 1 capsule (15 mg total) by mouth every evening. 90 capsule 0   torsemide (DEMADEX) 10 MG tablet Take 1 tablet (10 mg total) by mouth daily. 90 tablet 0   promethazine-dextromethorphan (PROMETHAZINE-DM) 6.25-15 MG/5ML syrup Take 2.75ml-5ml every 6 hours for cough 240 mL 0   No facility-administered medications prior to visit.    ROS Review of Systems  Objective:  BP 116/68 (BP  Location: Left Arm, Patient Position: Sitting, Cuff Size: Small)   Pulse 70   Temp 98 F (36.7 C) (Oral)   Ht 5\' 3"  (1.6 m)   Wt 120 lb (54.4 kg)   SpO2 98%   BMI 21.26 kg/m   BP Readings from Last 3 Encounters:  06/08/23 116/68  05/12/23 116/68  05/06/23 (!) 112/58    Wt Readings from Last 3 Encounters:  06/08/23 120 lb (54.4 kg)  05/12/23 118 lb (53.5 kg)  05/06/23 118 lb (53.5 kg)    Physical Exam  Lab Results  Component Value Date   WBC 8.4 03/10/2023   HGB 12.3 03/10/2023   HCT 38.7  03/10/2023   PLT 258.0 03/10/2023   GLUCOSE 65 (L) 03/10/2023   CHOL 169 01/14/2022   TRIG 157.0 (H) 01/14/2022   HDL 41.50 01/14/2022   LDLDIRECT 116.1 06/25/2010   LDLCALC 96 01/14/2022   ALT 10 11/26/2022   AST 17 11/26/2022   NA 137 03/10/2023   K 4.2 03/10/2023   CL 98 03/10/2023   CREATININE 0.88 03/10/2023   BUN 30 (H) 03/10/2023   CO2 32 03/10/2023   TSH 1.07 03/10/2023   INR 1.2 10/18/2022   HGBA1C 5.6 02/25/2013    CT CHEST HIGH RESOLUTION Result Date: 03/04/2023 CLINICAL DATA:  73 year old female with history of persistent chronic cough. Possible pneumonia. EXAM: CT CHEST WITHOUT CONTRAST TECHNIQUE: Multidetector CT imaging of the chest was performed following the standard protocol without intravenous contrast. High resolution imaging of the lungs, as well as inspiratory and expiratory imaging, was performed. RADIATION DOSE REDUCTION: This exam was performed according to the departmental dose-optimization program which includes automated exposure control, adjustment of the mA and/or kV according to patient size and/or use of iterative reconstruction technique. COMPARISON:  Chest CT 08/31/2022. FINDINGS: Cardiovascular: Heart size is normal. There is no significant pericardial fluid, thickening or pericardial calcification. There is aortic atherosclerosis, as well as atherosclerosis of the great vessels of the mediastinum and the coronary arteries, including calcified  atherosclerotic plaque in the right coronary artery. Mediastinum/Nodes: Prominent AP window lymph node measuring up to 1.1 cm in short axis, similar to prior studies, presumably benign and reactive. No other definite pathologically enlarged mediastinal or hilar lymph nodes are noted. Esophagus is unremarkable in appearance. No axillary lymphadenopathy. Lungs/Pleura: High-resolution imaging demonstrates some patchy areas of ground-glass attenuation, septal thickening, thickening of the peribronchovascular interstitium and scattered areas of cylindrical bronchiectasis. No honeycombing noted. Bronchiectasis is most severe in the left lower lobe where there is extensive mucoid impaction and extensive peribronchovascular architectural distortion, likely to reflect areas of chronic post infectious or inflammatory scarring. Inspiratory and expiratory imaging demonstrates some mild air trapping indicative of mild small airways disease. Small amount of consolidation is noted in the left lower lobe suggesting active pneumonia. No pleural effusions. Upper Abdomen: Unremarkable. Musculoskeletal: There are no aggressive appearing lytic or blastic lesions noted in the visualized portions of the skeleton. IMPRESSION: 1. There is a spectrum of findings in the lungs considered most compatible with an alternative diagnosis (not usual interstitial pneumonia) per current ATS guidelines. 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 2. Airspace consolidation in the left lower lobe in the midst of an area of extensive bronchiectasis, indicative of pneumonia. 3. Mild air trapping indicative of mild small airways disease. 4. Aortic atherosclerosis, in addition to right coronary artery disease. Please note that although the presence of coronary artery calcium documents the presence of coronary artery disease, the severity of this disease and any potential stenosis cannot be assessed on this non-gated CT  examination. Assessment for potential risk factor modification, dietary therapy or pharmacologic therapy may be warranted, if clinically indicated. Aortic Atherosclerosis (ICD10-I70.0). Electronically Signed   By: Trudie Reed M.D.   On: 03/04/2023 06:48    Assessment & Plan:  Need for hepatitis C screening test -     Hepatitis C antibody; Future  Chronic respiratory failure with hypoxia (HCC) -     CBC with Differential/Platelet; Future -     Basic metabolic panel; Future  Hypogammaglobulinemia (HCC) -     CBC with Differential/Platelet; Future -     Basic metabolic panel;  Future  Hyponatremia -     Basic metabolic panel; Future  Hypothyroidism -     Lipid panel; Future     Follow-up: Return in about 4 months (around 10/08/2023).  Sanda Linger, MD

## 2023-06-08 NOTE — Patient Instructions (Signed)
 Hypothyroidism  Hypothyroidism is when the thyroid gland does not make enough of certain hormones. This is called an underactive thyroid. The thyroid gland is a small gland located in the lower front part of the neck, just in front of the windpipe (trachea). This gland makes hormones that help control how the body uses food for energy (metabolism) as well as how the heart and brain function. These hormones also play a role in keeping your bones strong. When the thyroid is underactive, it produces too little of the hormones thyroxine (T4) and triiodothyronine (T3). What are the causes? This condition may be caused by: Hashimoto's disease. This is a disease in which the body's disease-fighting system (immune system) attacks the thyroid gland. This is the most common cause. Viral infections. Pregnancy. Certain medicines. Birth defects. Problems with a gland in the center of the brain (pituitary gland). Lack of enough iodine in the diet. Other causes may include: Past radiation treatments to the head or neck for cancer. Past treatment with radioactive iodine. Past exposure to radiation in the environment. Past surgical removal of part or all of the thyroid. What increases the risk? You are more likely to develop this condition if: You are female. You have a family history of thyroid conditions. You use a medicine called lithium. You take medicines that affect the immune system (immunosuppressants). What are the signs or symptoms? Common symptoms of this condition include: Not being able to tolerate cold. Feeling as though you have no energy (lethargy). Lack of appetite. Constipation. Sadness or depression. Weight gain that is not explained by a change in diet or exercise habits. Menstrual irregularity. Dry skin, coarse hair, or brittle nails. Other symptoms may include: Muscle pain. Slowing of thought processes. Poor memory. How is this diagnosed? This condition may be diagnosed  based on: Your symptoms, your medical history, and a physical exam. Blood tests. You may also have imaging tests, such as an ultrasound or MRI. How is this treated? This condition is treated with medicine that replaces the thyroid hormones that your body does not make. After you begin treatment, it may take several weeks for symptoms to go away. Follow these instructions at home: Take over-the-counter and prescription medicines only as told by your health care provider. If you start taking any new medicines, tell your health care provider. Keep all follow-up visits as told by your health care provider. This is important. As your condition improves, your dosage of thyroid hormone medicine may change. You will need to have blood tests regularly so that your health care provider can monitor your condition. Contact a health care provider if: Your symptoms do not get better with treatment. You are taking thyroid hormone replacement medicine and you: Sweat a lot. Have tremors. Feel anxious. Lose weight rapidly. Cannot tolerate heat. Have emotional swings. Have diarrhea. Feel weak. Get help right away if: You have chest pain. You have an irregular heartbeat. You have a rapid heartbeat. You have difficulty breathing. These symptoms may be an emergency. Get help right away. Call 911. Do not wait to see if the symptoms will go away. Do not drive yourself to the hospital. Summary Hypothyroidism is when the thyroid gland does not make enough of certain hormones (it is underactive). When the thyroid is underactive, it produces too little of the hormones thyroxine (T4) and triiodothyronine (T3). The most common cause is Hashimoto's disease, a disease in which the body's disease-fighting system (immune system) attacks the thyroid gland. The condition can also be caused  by viral infections, medicine, pregnancy, or past radiation treatment to the head or neck. Symptoms may include weight gain, dry  skin, constipation, feeling as though you do not have energy, and not being able to tolerate cold. This condition is treated with medicine to replace the thyroid hormones that your body does not make. This information is not intended to replace advice given to you by your health care provider. Make sure you discuss any questions you have with your health care provider. Document Revised: 03/12/2021 Document Reviewed: 03/12/2021 Elsevier Patient Education  2024 [employer].

## 2023-06-09 DIAGNOSIS — R260 Ataxic gait: Secondary | ICD-10-CM | POA: Diagnosis not present

## 2023-06-09 DIAGNOSIS — D849 Immunodeficiency, unspecified: Secondary | ICD-10-CM | POA: Diagnosis not present

## 2023-06-09 DIAGNOSIS — Z87448 Personal history of other diseases of urinary system: Secondary | ICD-10-CM | POA: Diagnosis not present

## 2023-06-09 DIAGNOSIS — S42211D Unspecified displaced fracture of surgical neck of right humerus, subsequent encounter for fracture with routine healing: Secondary | ICD-10-CM | POA: Diagnosis not present

## 2023-06-09 DIAGNOSIS — E039 Hypothyroidism, unspecified: Secondary | ICD-10-CM | POA: Diagnosis not present

## 2023-06-09 DIAGNOSIS — Z8701 Personal history of pneumonia (recurrent): Secondary | ICD-10-CM | POA: Diagnosis not present

## 2023-06-09 DIAGNOSIS — J449 Chronic obstructive pulmonary disease, unspecified: Secondary | ICD-10-CM | POA: Diagnosis not present

## 2023-06-09 DIAGNOSIS — K219 Gastro-esophageal reflux disease without esophagitis: Secondary | ICD-10-CM | POA: Diagnosis not present

## 2023-06-09 DIAGNOSIS — R296 Repeated falls: Secondary | ICD-10-CM | POA: Diagnosis not present

## 2023-06-09 DIAGNOSIS — G35 Multiple sclerosis: Secondary | ICD-10-CM | POA: Diagnosis not present

## 2023-06-10 ENCOUNTER — Ambulatory Visit: Payer: Medicare Other | Admitting: Internal Medicine

## 2023-06-10 ENCOUNTER — Encounter: Payer: Self-pay | Admitting: Internal Medicine

## 2023-06-10 ENCOUNTER — Other Ambulatory Visit: Payer: Self-pay

## 2023-06-10 ENCOUNTER — Ambulatory Visit (INDEPENDENT_AMBULATORY_CARE_PROVIDER_SITE_OTHER): Admitting: Internal Medicine

## 2023-06-10 VITALS — BP 126/61 | HR 73 | Temp 97.5°F | Ht 63.0 in | Wt 120.0 lb

## 2023-06-10 DIAGNOSIS — D839 Common variable immunodeficiency, unspecified: Secondary | ICD-10-CM

## 2023-06-10 DIAGNOSIS — Z792 Long term (current) use of antibiotics: Secondary | ICD-10-CM

## 2023-06-10 DIAGNOSIS — J154 Pneumonia due to other streptococci: Secondary | ICD-10-CM

## 2023-06-10 LAB — HEPATITIS C ANTIBODY: Hepatitis C Ab: NONREACTIVE

## 2023-06-10 NOTE — Patient Instructions (Signed)
 You are doing really well since azithromycin was increased to once a day.   Continue seeing your immunologist and lung doctor regularly   Can see me once a year

## 2023-06-10 NOTE — Progress Notes (Signed)
 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) 07/08/2022   GERD (gastroesophageal reflux disease) 06/20/2022   Chronic obstructive pulmonary disease (HCC) 09/24/2021   Pulmonary mycobacterial infection (HCC) 09/24/2021   Bilateral vocal cord paralysis 09/24/2021   UTI (urinary tract infection) 05/24/2021   COPD (chronic obstructive pulmonary disease) (HCC)    Iron deficiency anemia secondary to inadequate dietary iron intake 12/20/2020   Purple toe syndrome of both feet (HCC) 07/26/2020   Cough variant asthma vs vcd  related to MS  11/10/2016   Positional sleep apnea 09/10/2016   Periodic limb movements of sleep 09/10/2016   OSA on CPAP 02/08/2016   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/21/2013   Constipation 02/26/2013   Hyponatremia 05/20/2012   Post-menopausal osteoporosis 09/17/2007   Hypothyroidism 04/01/2007   Multiple sclerosis (HCC) 04/01/2007      Subjective:    Patient ID: Kayla [name], female    DOB: 12-Jun-1950, 73 y.o.   MRN: [id number]  Chief Complaint  Patient presents with   Follow-up    Recurrent infections     HPI:  Kayla [name] is a 73 y.o. female CVID here for f/u hospital admission for recurrent strep pna infection   She is here with husband today She is in a wheel chair on on baseline 4 liters o2 supplement She has chronic productive cough which she reported  unchange  She is at baseline now  She said there is a planned chest xray sooner   06/10/23 id clinic f/u See below a&p   Allergies  Allergen Reactions   Tramadol Other (See Comments)    Hallucinations    Tyloxapol Other (See Comments)   Contrast Media [Iodinated Contrast Media] Rash   Iohexol Hives   Morphine And Codeine Other (See Comments)    Hallucinations    Acetaminophen    Methylprednisolone Other (See Comments)    Decreases pts heart rate    Oxycodone    Oxycodone Hcl    Tizanidine Hcl    Hydrocodone Itching   Oxycodone-Acetaminophen Rash and Other (See Comments)    Hallucinations, also   Phenytoin Sodium Extended Rash   Zanaflex [Tizanidine Hydrochloride] Rash      Outpatient Medications Prior to Visit  Medication Sig Dispense Refill   albuterol (PROVENTIL) (2.5 MG/3ML) 0.083% nebulizer solution Take 3 mLs (2.5 mg total) by nebulization every 4-6 hours as needed for shortness of breath and wheezing 180 mL 1   amphetamine-dextroamphetamine (ADDERALL) 20 MG tablet Take 20 mg by mouth 2 (two) times daily.     azelastine (ASTELIN) 0.1 % nasal spray Place 1 spray into both nostrils 2 (two) times daily. Use in each nostril as directed 30 mL 1   azithromycin (ZITHROMAX) 250 MG tablet Take 1 tablet (250 mg total) by mouth daily. 30 tablet 5   baclofen (LIORESAL) 10 MG tablet Take 10 mg by mouth in the morning.     bisacodyl (DULCOLAX) 5 MG EC tablet Take 5  mg by mouth See admin instructions. Take 5 mg by mouth in the morning and evening     budesonide (PULMICORT) 0.5 MG/2ML nebulizer solution USE 1 VIAL  IN  NEBULIZER TWICE  DAILY - Rinse Mouth After Treatment 120 mL 5   calcium carbonate (OS-CAL - DOSED IN MG OF ELEMENTAL CALCIUM) 1250 (500 Ca) MG tablet Take 1 tablet (1,250 mg total) by mouth daily with breakfast.     Coenzyme Q10 (VITALINE COQ10) 300 MG WAFR Take 300 mg by mouth in the morning.     dexlansoprazole (DEXILANT) 60 MG capsule TAKE 1 CAPSULE BY MOUTH ONCE  DAILY WITH BREAKFAST 90 capsule 0   famotidine (PEPCID) 20 MG tablet One after supper (Patient taking differently: Take 20 mg by mouth every evening.) 30 tablet 11   Ferric Maltol (ACCRUFER) 30 MG CAPS Take 1 capsule (30 mg total) by mouth in the morning and at bedtime. 180 capsule 1   FLUoxetine (PROZAC) 40 MG capsule Take 1 capsule (40 mg total) by mouth daily. 90 capsule 1   formoterol (PERFOROMIST) 20 MCG/2ML nebulizer solution USE 1 VIAL  IN  NEBULIZER TWICE  DAILY - Morning and Evening 120 mL 5   gabapentin (NEURONTIN) 100 MG capsule Take 100 mg by mouth 3 (three) times daily.     guaiFENesin (MUCINEX) 600 MG 12 hr tablet Take 1 tablet (600 mg total) by mouth 2 (two) times daily. (Patient taking differently: Take 600 mg by mouth See admin instructions. Take 600 mg by mouth in the morning and evening) 30 tablet 0   levothyroxine (SYNTHROID) 100 MCG tablet TAKE 1 TABLET BY MOUTH ONCE DAILY BEFORE BREAKFAST 90 tablet 3   MAGNESIUM PO Take 1 tablet by mouth every evening.     methenamine (HIPREX) 1 g tablet Take 1 tablet (1 g total) by mouth 2 (two) times daily with a meal. 180 tablet 0   mirabegron ER (MYRBETRIQ) 50 MG TB24 tablet Take 50 mg by mouth in the morning.     mirtazapine (REMERON) 15 MG tablet TAKE ONE TABLET BY MOUTH AT BEDTIME 90 tablet 1   NON FORMULARY Take 1 tablet by mouth See admin instructions. [employer] Mature Multivitamin with Calcium- Take 1 tablet by mouth with breakfast in the morning     NON FORMULARY Take 1 capsule by mouth See admin instructions. [employer] probiotic advanced  digestive probiotic- Take 1 capsule by mouth in the morning     nystatin (MYCOSTATIN) 100000 UNIT/ML suspension Swish, gargle, and swallow 5 mL after budesonide nebulizer treatment. 300 mL 5   OXYGEN Inhale 2-3.5 L/min into the lungs continuous.     PRESCRIPTION MEDICATION Apply 1 Device topically See admin instructions. Afflovest- For 30 minutes in the morning and evening- to begin @ 60% and  work up to 80%     Respiratory Therapy Supplies (FLUTTER) DEVI Use as directed (Patient taking differently: Inhale 1 Device into the lungs as directed.) 1 each 0   revefenacin (YUPELRI) 175 MCG/3ML nebulizer solution USE 1 VIAL IN NEBULIZER DAILY 90 mL 5   sodium chloride HYPERTONIC 3 % nebulizer solution Take by nebulization 2 (two) times daily as needed for cough. Dx J44.9 750 mL 1   tamsulosin (FLOMAX) 0.4 MG CAPS capsule Take 0.4 mg by mouth in the morning.     temazepam (RESTORIL) 15 MG capsule Take 1 capsule (15 mg total) by mouth every evening. 90 capsule 0   torsemide (DEMADEX) 10 MG tablet Take 1 tablet (10 mg total) by  mouth daily. 90 tablet 0   No facility-administered medications prior to visit.     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  Tobacco Use   Smoking status: Never   Smokeless tobacco: Never  Vaping Use   Vaping status: Never Used  Substance and Sexual Activity   Alcohol use: Not Currently    Alcohol/week: 2.0 standard drinks of alcohol    Types: 1 Glasses of wine, 1 Cans of beer per week    Comment: 2x/week    Drug use: No   Sexual activity: Yes  Other Topics Concern   Not on file  Social History Narrative   Not on file   Social Drivers of Health   Financial Resource Strain: Low Risk  (03/09/2023)   Overall Financial Resource Strain (CARDIA)    Difficulty of Paying Living Expenses: Not hard at all  Food Insecurity: No Food Insecurity (03/09/2023)   Hunger Vital Sign    Worried About Running Out of Food in the Last Year: Never true    Ran Out of Food in the Last Year: Never true  Transportation Needs: No Transportation Needs (03/09/2023)   PRAPARE - Administrator, Civil Service (Medical): No    Lack of Transportation  (Non-Medical): No  Physical Activity: Insufficiently Active (03/09/2023)   Exercise Vital Sign    Days of Exercise per Week: 2 days    Minutes of Exercise per Session: 60 min  Stress: No Stress Concern Present (03/09/2023)   [employer] of Occupational Health - Occupational Stress Questionnaire    Feeling of Stress : Not at all  Social Connections: Unknown (03/09/2023)   Social Connection and Isolation Panel [NHANES]    Frequency of Communication with Friends and Family: More than three times a week    Frequency of Social Gatherings with Friends and Family: Once a week    Attends Religious Services: Patient declined    Database administrator or Organizations: Yes    Attends Banker Meetings: Patient declined    Marital Status: Married  Catering manager Violence: Not At Risk (02/27/2023)   Humiliation, Afraid, Rape, and Kick questionnaire    Fear of Current or Ex-Partner: No    Emotionally Abused: No    Physically Abused: No    Sexually Abused: No      Review of Systems    All other ros negative (no diarrhea, rash, joint or muscle ache)  Objective:    BP 126/61   Pulse 73   Temp (!) 97.5 F (36.4 C) (Temporal)   Ht 5\' 3"  (1.6 m)   Wt 120 lb (54.4 kg)   SpO2 99%   BMI 21.26 kg/m  Nursing note and vital signs reviewed.  Physical Exam     General/constitutional: no distress, pleasant; walking with fww; no o2 supplement today 06/10/23 HEENT: Normocephalic, PER, Conj Clear, EOMI, Oropharynx clear Neck supple CV: rrr no mrg Lungs: clear to auscultation, normal respiratory effort Abd: Soft, Nontender Ext: no edema Skin: No Rash Neuro: nonfocal MSK: no peripheral joint swelling/tenderness/warmth; back spines nontender    Labs: Lab Results  Component Value Date   WBC 6.5 06/08/2023   HGB 12.6 06/08/2023   HCT 38.6 06/08/2023   MCV 88.8 06/08/2023  PLT 272.0 06/08/2023   Last metabolic panel Lab Results  Component Value Date   GLUCOSE 91  06/08/2023   NA 135 06/08/2023   K 4.7 06/08/2023   CL 95 (L) 06/08/2023   CO2 32 06/08/2023   BUN 33 (H) 06/08/2023   CREATININE 0.86 06/08/2023   GFR 68.23 06/08/2023   CALCIUM 9.8 06/08/2023   PHOS 2.9 10/23/2022   PROT 5.9 (L) 11/26/2022   ALBUMIN 1.9 (L) 10/21/2022   BILITOT 0.2 11/26/2022   ALKPHOS 55 10/21/2022   AST 17 11/26/2022   ALT 10 11/26/2022   ANIONGAP 10 10/23/2022    Micro:  Serology:  Imaging:  Assessment & Plan:   Problem List Items Addressed This Visit   None      No orders of the defined types were placed in this encounter.    Abx: Chronic daily azithromycin from three times per week, changed end of of 2024  10/20/22-?12/2022 amox-clav   7/28-29 azith 7/27-28 vanc 7/27-29 ceftriaxone                                                          Assessment: 73 yo female with ms, bronchiectasis, CVID, admitted with pna/recurrent strep pnae bacteremia     Reviewed chart and noted patient off ivig since 3//2024 until resuming again just after the prior 2 episodes of strep pnae  infection 08/2022.   Tee 4 weeks ago no evidence of endocarditis and she has no nidus for recurrence   She does have right prosthetic hip joint but no acute pain/swelling     7/27 bcx strep pnae (I pcn serum mic; S ceftriaxone serum mic; I ceftriaxone meningitis mic) 7/29 repeat bcx ngtd  Prevnar 20 given  Advised patient to f/u allergy/immunology for continued periodic ivig infusion and to continue amoxclav daily for prophylaxis     ----- 11/26/22 id clinic assessment Currently at baseline health (productive cough, 4 liters o2, subjectively feels baseline) Cxr pending for ?f/u hospital admission Advise patient if clinically new changes suggest of pna then would use xray to help decide treatment, but not using xray alone if no clinically sign suggesting pna Continue daily amox-clav Cbc/cmp for abx monitoring Continue periodic ivig with her allergist F/u 6  months  ---- 06/10/2023 id clinic assessment Patient had stopped augmentin (doesn't remember); she has been on azithromycin and had continued it She feels well now. Hasn't had a pneumonia since hospitalization last year She is gaining weight She only uses oxygen at night now  She continues to see pulmonology and immunology and getting iv ig every week   No labs needed today  Can see me once a year     Follow-up: No follow-ups on file.      Raymondo Band, MD [facility] 06/10/2023, 11:00 AM

## 2023-06-11 ENCOUNTER — Other Ambulatory Visit: Payer: Self-pay | Admitting: Internal Medicine

## 2023-06-11 DIAGNOSIS — R260 Ataxic gait: Secondary | ICD-10-CM | POA: Diagnosis not present

## 2023-06-11 DIAGNOSIS — S42211D Unspecified displaced fracture of surgical neck of right humerus, subsequent encounter for fracture with routine healing: Secondary | ICD-10-CM | POA: Diagnosis not present

## 2023-06-11 DIAGNOSIS — R296 Repeated falls: Secondary | ICD-10-CM | POA: Diagnosis not present

## 2023-06-11 DIAGNOSIS — G35 Multiple sclerosis: Secondary | ICD-10-CM | POA: Diagnosis not present

## 2023-06-11 NOTE — Telephone Encounter (Signed)
 Please advise If appropriate to continue did not see anything about it in last office note

## 2023-06-12 ENCOUNTER — Encounter: Payer: Self-pay | Admitting: Internal Medicine

## 2023-06-16 DIAGNOSIS — R260 Ataxic gait: Secondary | ICD-10-CM | POA: Diagnosis not present

## 2023-06-16 DIAGNOSIS — R296 Repeated falls: Secondary | ICD-10-CM | POA: Diagnosis not present

## 2023-06-16 DIAGNOSIS — S42211D Unspecified displaced fracture of surgical neck of right humerus, subsequent encounter for fracture with routine healing: Secondary | ICD-10-CM | POA: Diagnosis not present

## 2023-06-16 DIAGNOSIS — G35 Multiple sclerosis: Secondary | ICD-10-CM | POA: Diagnosis not present

## 2023-06-18 DIAGNOSIS — R260 Ataxic gait: Secondary | ICD-10-CM | POA: Diagnosis not present

## 2023-06-18 DIAGNOSIS — S42211D Unspecified displaced fracture of surgical neck of right humerus, subsequent encounter for fracture with routine healing: Secondary | ICD-10-CM | POA: Diagnosis not present

## 2023-06-18 DIAGNOSIS — G35 Multiple sclerosis: Secondary | ICD-10-CM | POA: Diagnosis not present

## 2023-06-18 DIAGNOSIS — R296 Repeated falls: Secondary | ICD-10-CM | POA: Diagnosis not present

## 2023-06-23 DIAGNOSIS — S42211D Unspecified displaced fracture of surgical neck of right humerus, subsequent encounter for fracture with routine healing: Secondary | ICD-10-CM | POA: Diagnosis not present

## 2023-06-23 DIAGNOSIS — G35 Multiple sclerosis: Secondary | ICD-10-CM | POA: Diagnosis not present

## 2023-06-23 DIAGNOSIS — R260 Ataxic gait: Secondary | ICD-10-CM | POA: Diagnosis not present

## 2023-06-23 DIAGNOSIS — R296 Repeated falls: Secondary | ICD-10-CM | POA: Diagnosis not present

## 2023-06-25 DIAGNOSIS — R296 Repeated falls: Secondary | ICD-10-CM | POA: Diagnosis not present

## 2023-06-25 DIAGNOSIS — G35 Multiple sclerosis: Secondary | ICD-10-CM | POA: Diagnosis not present

## 2023-06-25 DIAGNOSIS — S42211D Unspecified displaced fracture of surgical neck of right humerus, subsequent encounter for fracture with routine healing: Secondary | ICD-10-CM | POA: Diagnosis not present

## 2023-06-25 DIAGNOSIS — R260 Ataxic gait: Secondary | ICD-10-CM | POA: Diagnosis not present

## 2023-06-29 DIAGNOSIS — G35 Multiple sclerosis: Secondary | ICD-10-CM | POA: Diagnosis not present

## 2023-06-29 DIAGNOSIS — R260 Ataxic gait: Secondary | ICD-10-CM | POA: Diagnosis not present

## 2023-06-29 DIAGNOSIS — R296 Repeated falls: Secondary | ICD-10-CM | POA: Diagnosis not present

## 2023-06-29 DIAGNOSIS — S42211D Unspecified displaced fracture of surgical neck of right humerus, subsequent encounter for fracture with routine healing: Secondary | ICD-10-CM | POA: Diagnosis not present

## 2023-06-30 ENCOUNTER — Telehealth: Payer: Self-pay | Admitting: Allergy and Immunology

## 2023-06-30 ENCOUNTER — Telehealth: Payer: Self-pay | Admitting: Internal Medicine

## 2023-06-30 NOTE — Telephone Encounter (Signed)
 Patient's spouse called stating the patient has tested positive for Covid-19 and needs a prescription of Paxlovid sent to [facility].

## 2023-06-30 NOTE — Progress Notes (Deleted)
 Virtual Visit via Video Note  I connected with Kayla [name] on 06/30/23 at  9:40 AM EDT by a video enabled telemedicine application and verified that I am speaking with the correct person using two identifiers.  {Patient [phone]::"Home"} {Provider Location:[phone]::"Home Office"}  I discussed the limitations, risks, security, and privacy concerns of performing an evaluation and management service by video and the availability of in person appointments. I also discussed with the patient that there may be a patient responsible charge related to this service. The patient expressed understanding and agreed to proceed.  Subjective: PCP: [name], MD  No chief complaint on file.  HPI   ROS: Per HPI  Current Outpatient Medications:  .  albuterol (PROVENTIL) (2.5 MG/3ML) 0.083% nebulizer solution, Take 3 mLs (2.5 mg total) by nebulization every 4-6 hours as needed for shortness of breath and wheezing, Disp: 180 mL, Rfl: 1 .  amphetamine-dextroamphetamine (ADDERALL) 20 MG tablet, Take 20 mg by mouth 2 (two) times daily., Disp: , Rfl:  .  azelastine (ASTELIN) 0.1 % nasal spray, Place 1 spray into both nostrils 2 (two) times daily. Use in each nostril as directed, Disp: 30 mL, Rfl: 1 .  azithromycin (ZITHROMAX) 250 MG tablet, Take 1 tablet (250 mg total) by mouth daily., Disp: 30 tablet, Rfl: 5 .  baclofen (LIORESAL) 10 MG tablet, Take 10 mg by mouth in the morning., Disp: , Rfl:  .  bisacodyl (DULCOLAX) 5 MG EC tablet, Take 5 mg by mouth See admin instructions. Take 5 mg by mouth in the morning and evening, Disp: , Rfl:  .  budesonide (PULMICORT) 0.5 MG/2ML nebulizer solution, USE 1 VIAL  IN  NEBULIZER TWICE  DAILY - Rinse Mouth After Treatment, Disp: 120 mL, Rfl: 5 .  calcium carbonate (OS-CAL - DOSED IN MG OF ELEMENTAL CALCIUM) 1250 (500 Ca) MG tablet, Take 1 tablet (1,250 mg total) by mouth daily with breakfast., Disp: , Rfl:  .  Coenzyme Q10 (VITALINE COQ10) 300 MG  WAFR, Take 300 mg by mouth in the morning., Disp: , Rfl:  .  dexlansoprazole (DEXILANT) 60 MG capsule, TAKE 1 CAPSULE BY MOUTH ONCE DAILY WITH BREAKFAST, Disp: 90 capsule, Rfl: 0 .  famotidine (PEPCID) 20 MG tablet, One after supper (Patient taking differently: Take 20 mg by mouth every evening.), Disp: 30 tablet, Rfl: 11 .  Ferric Maltol (ACCRUFER) 30 MG CAPS, Take 1 capsule (30 mg total) by mouth in the morning and at bedtime., Disp: 180 capsule, Rfl: 1 .  FLUoxetine (PROZAC) 40 MG capsule, Take 1 capsule (40 mg total) by mouth daily., Disp: 90 capsule, Rfl: 1 .  formoterol (PERFOROMIST) 20 MCG/2ML nebulizer solution, USE 1 VIAL  IN  NEBULIZER TWICE  DAILY - Morning and Evening, Disp: 120 mL, Rfl: 5 .  gabapentin (NEURONTIN) 100 MG capsule, Take 100 mg by mouth 3 (three) times daily., Disp: , Rfl:  .  guaiFENesin (MUCINEX) 600 MG 12 hr tablet, Take 1 tablet (600 mg total) by mouth 2 (two) times daily. (Patient taking differently: Take 600 mg by mouth See admin instructions. Take 600 mg by mouth in the morning and evening), Disp: 30 tablet, Rfl: 0 .  levothyroxine (SYNTHROID) 100 MCG tablet, TAKE 1 TABLET BY MOUTH ONCE DAILY BEFORE BREAKFAST, Disp: 90 tablet, Rfl: 3 .  MAGNESIUM PO, Take 1 tablet by mouth every evening., Disp: , Rfl:  .  methenamine (HIPREX) 1 g tablet, Take 1 tablet (1 g total) by mouth 2 (two) times daily with  a meal., Disp: 180 tablet, Rfl: 0 .  mirabegron ER (MYRBETRIQ) 50 MG TB24 tablet, Take 50 mg by mouth in the morning., Disp: , Rfl:  .  mirtazapine (REMERON) 15 MG tablet, TAKE ONE TABLET BY MOUTH AT BEDTIME, Disp: 90 tablet, Rfl: 1 .  NON FORMULARY, Take 1 tablet by mouth See admin instructions. [employer] Mature Multivitamin with Calcium- Take 1 tablet by mouth with breakfast in the morning, Disp: , Rfl:  .  NON FORMULARY, Take 1 capsule by mouth See admin instructions. [employer] probiotic advanced  digestive probiotic- Take 1 capsule by mouth in the morning, Disp: ,  Rfl:  .  nystatin (MYCOSTATIN) 100000 UNIT/ML suspension, Swish, gargle, and swallow 5 mL after budesonide nebulizer treatment., Disp: 300 mL, Rfl: 5 .  OXYGEN, Inhale 2-3.5 L/min into the lungs continuous., Disp: , Rfl:  .  PRESCRIPTION MEDICATION, Apply 1 Device topically See admin instructions. Afflovest- For 30 minutes in the morning and evening- to begin @ 60% and work up to 80%, Disp: , Rfl:  .  Respiratory Therapy Supplies (FLUTTER) DEVI, Use as directed (Patient taking differently: Inhale 1 Device into the lungs as directed.), Disp: 1 each, Rfl: 0 .  revefenacin (YUPELRI) 175 MCG/3ML nebulizer solution, USE 1 VIAL IN NEBULIZER DAILY, Disp: 90 mL, Rfl: 5 .  sodium chloride HYPERTONIC 3 % nebulizer solution, Take by nebulization 2 (two) times daily as needed for cough. Dx J44.9, Disp: 750 mL, Rfl: 1 .  tamsulosin (FLOMAX) 0.4 MG CAPS capsule, Take 0.4 mg by mouth in the morning., Disp: , Rfl:  .  temazepam (RESTORIL) 15 MG capsule, Take 1 capsule (15 mg total) by mouth every evening., Disp: 90 capsule, Rfl: 0 .  torsemide (DEMADEX) 10 MG tablet, Take 1 tablet (10 mg total) by mouth daily., Disp: 90 tablet, Rfl: 0  Observations/Objective: There were no vitals filed for this visit. Physical Exam Vitals and nursing note reviewed.  Constitutional:      General: She is not in acute distress. HENT:     Head: Normocephalic and atraumatic.  Eyes:     Extraocular Movements: Extraocular movements intact.  Pulmonary:     Effort: Pulmonary effort is normal.  Musculoskeletal:     Cervical back: Normal range of motion.  Neurological:     General: No focal deficit present.     Mental Status: She is alert and oriented to person, place, and time.  Psychiatric:        Mood and Affect: Mood normal.        Behavior: Behavior normal.   Assessment and Plan: There are no diagnoses linked to this encounter.  Follow Up Instructions: No follow-ups on file.   I discussed the assessment and treatment  plan with the patient. The patient was provided an opportunity to ask questions, and all were answered. The patient agreed with the plan and demonstrated an understanding of the instructions.   The patient was advised to call back or seek an in-person evaluation if the symptoms worsen or if the condition fails to improve as anticipated.  The above assessment and management plan was discussed with the patient. The patient verbalized understanding of and has agreed to the management plan.   Sherald Barge, FNP

## 2023-06-30 NOTE — Telephone Encounter (Signed)
 Patient tested at home for Covid and it came back positive. She states she only has a sore throat and it started today with no other symptoms. Please advise on sending in Paxlovid.

## 2023-06-30 NOTE — Telephone Encounter (Signed)
 Copied from CRM [id number]. Topic: Clinical - Medical Advice >> Jun 30, 2023  3:03 PM Kayla [name] wrote: Reason for CRM: Patient took a Covid at home test and it came out positive, she is experiencing a sore throat and would like to know if there is any chance for something to be called in, I advised that at least a virtual appointment would be required. Scheduled a virtual appointment tomorrow with Moshe Cipro but patient wanted to know if I would be able to send the message and see if something can be sent today.

## 2023-07-01 ENCOUNTER — Telehealth: Payer: Self-pay | Admitting: Allergy and Immunology

## 2023-07-01 ENCOUNTER — Other Ambulatory Visit (HOSPITAL_BASED_OUTPATIENT_CLINIC_OR_DEPARTMENT_OTHER): Payer: Self-pay

## 2023-07-01 ENCOUNTER — Ambulatory Visit: Admitting: Family Medicine

## 2023-07-01 DIAGNOSIS — G35 Multiple sclerosis: Secondary | ICD-10-CM

## 2023-07-01 DIAGNOSIS — U071 COVID-19: Secondary | ICD-10-CM

## 2023-07-01 DIAGNOSIS — J9611 Chronic respiratory failure with hypoxia: Secondary | ICD-10-CM

## 2023-07-01 DIAGNOSIS — J449 Chronic obstructive pulmonary disease, unspecified: Secondary | ICD-10-CM

## 2023-07-01 DIAGNOSIS — G4733 Obstructive sleep apnea (adult) (pediatric): Secondary | ICD-10-CM

## 2023-07-01 DIAGNOSIS — Z9981 Dependence on supplemental oxygen: Secondary | ICD-10-CM

## 2023-07-01 MED ORDER — NIRMATRELVIR&RITONAVIR 300/100 20 X 150 MG & 10 X 100MG PO TBPK
3.0000 | ORAL_TABLET | Freq: Two times a day (BID) | ORAL | 0 refills | Status: AC
  Filled 2023-07-01 (×2): qty 30, 5d supply, fill #0

## 2023-07-01 MED ORDER — NIRMATRELVIR&RITONAVIR 300/100 20 X 150 MG & 10 X 100MG PO TBPK: 3.0000 | ORAL_TABLET | Freq: Two times a day (BID) | ORAL | 0 refills | Status: DC

## 2023-07-01 NOTE — Telephone Encounter (Signed)
 Patient notified Rx has been sent to pharmacy and to check for drug interactions with pharmacist.

## 2023-07-01 NOTE — Telephone Encounter (Signed)
 Pt's spouse states please send prescription to [facility] they have paxlovid in stock.

## 2023-07-01 NOTE — Telephone Encounter (Signed)
  Paxlovid blister pack twice a day for 5 days Per Dr. Scarlette Ar has been sent in with the assistance of Dr. Dellis Anes to the [facility]. Patient has been notified and We apologized for the misunderstanding. Patient has called several times because they were not able to pick up medications.

## 2023-07-03 NOTE — Telephone Encounter (Signed)
 Patient had an appointment. Canceled it stated she feels better.

## 2023-07-07 ENCOUNTER — Other Ambulatory Visit: Payer: Self-pay | Admitting: Allergy and Immunology

## 2023-07-14 DIAGNOSIS — R260 Ataxic gait: Secondary | ICD-10-CM | POA: Diagnosis not present

## 2023-07-14 DIAGNOSIS — S42211D Unspecified displaced fracture of surgical neck of right humerus, subsequent encounter for fracture with routine healing: Secondary | ICD-10-CM | POA: Diagnosis not present

## 2023-07-14 DIAGNOSIS — G35 Multiple sclerosis: Secondary | ICD-10-CM | POA: Diagnosis not present

## 2023-07-14 DIAGNOSIS — R296 Repeated falls: Secondary | ICD-10-CM | POA: Diagnosis not present

## 2023-07-16 DIAGNOSIS — R296 Repeated falls: Secondary | ICD-10-CM | POA: Diagnosis not present

## 2023-07-16 DIAGNOSIS — R260 Ataxic gait: Secondary | ICD-10-CM | POA: Diagnosis not present

## 2023-07-16 DIAGNOSIS — S42211D Unspecified displaced fracture of surgical neck of right humerus, subsequent encounter for fracture with routine healing: Secondary | ICD-10-CM | POA: Diagnosis not present

## 2023-07-16 DIAGNOSIS — G35 Multiple sclerosis: Secondary | ICD-10-CM | POA: Diagnosis not present

## 2023-07-21 DIAGNOSIS — R296 Repeated falls: Secondary | ICD-10-CM | POA: Diagnosis not present

## 2023-07-21 DIAGNOSIS — G35 Multiple sclerosis: Secondary | ICD-10-CM | POA: Diagnosis not present

## 2023-07-21 DIAGNOSIS — R260 Ataxic gait: Secondary | ICD-10-CM | POA: Diagnosis not present

## 2023-07-21 DIAGNOSIS — S42211D Unspecified displaced fracture of surgical neck of right humerus, subsequent encounter for fracture with routine healing: Secondary | ICD-10-CM | POA: Diagnosis not present

## 2023-07-23 DIAGNOSIS — R260 Ataxic gait: Secondary | ICD-10-CM | POA: Diagnosis not present

## 2023-07-23 DIAGNOSIS — R296 Repeated falls: Secondary | ICD-10-CM | POA: Diagnosis not present

## 2023-07-23 DIAGNOSIS — S42211D Unspecified displaced fracture of surgical neck of right humerus, subsequent encounter for fracture with routine healing: Secondary | ICD-10-CM | POA: Diagnosis not present

## 2023-07-23 DIAGNOSIS — G35 Multiple sclerosis: Secondary | ICD-10-CM | POA: Diagnosis not present

## 2023-07-28 DIAGNOSIS — G35 Multiple sclerosis: Secondary | ICD-10-CM | POA: Diagnosis not present

## 2023-07-28 DIAGNOSIS — S42211D Unspecified displaced fracture of surgical neck of right humerus, subsequent encounter for fracture with routine healing: Secondary | ICD-10-CM | POA: Diagnosis not present

## 2023-07-28 DIAGNOSIS — R296 Repeated falls: Secondary | ICD-10-CM | POA: Diagnosis not present

## 2023-07-28 DIAGNOSIS — R260 Ataxic gait: Secondary | ICD-10-CM | POA: Diagnosis not present

## 2023-07-29 ENCOUNTER — Other Ambulatory Visit: Payer: Self-pay | Admitting: Internal Medicine

## 2023-07-29 DIAGNOSIS — F5104 Psychophysiologic insomnia: Secondary | ICD-10-CM

## 2023-07-30 ENCOUNTER — Other Ambulatory Visit: Payer: Self-pay | Admitting: Internal Medicine

## 2023-07-30 DIAGNOSIS — G35 Multiple sclerosis: Secondary | ICD-10-CM | POA: Diagnosis not present

## 2023-07-30 DIAGNOSIS — R260 Ataxic gait: Secondary | ICD-10-CM | POA: Diagnosis not present

## 2023-07-30 DIAGNOSIS — R296 Repeated falls: Secondary | ICD-10-CM | POA: Diagnosis not present

## 2023-07-30 DIAGNOSIS — F5104 Psychophysiologic insomnia: Secondary | ICD-10-CM

## 2023-07-30 DIAGNOSIS — S42211D Unspecified displaced fracture of surgical neck of right humerus, subsequent encounter for fracture with routine healing: Secondary | ICD-10-CM | POA: Diagnosis not present

## 2023-07-31 IMAGING — DX DG CHEST 2V
2 series · 2 of 2 positions shown · non-contrast
Comparison: 11/27/2020

CLINICAL DATA: Productive cough for several months

EXAM:
CHEST - 2 VIEW

[chest pa]
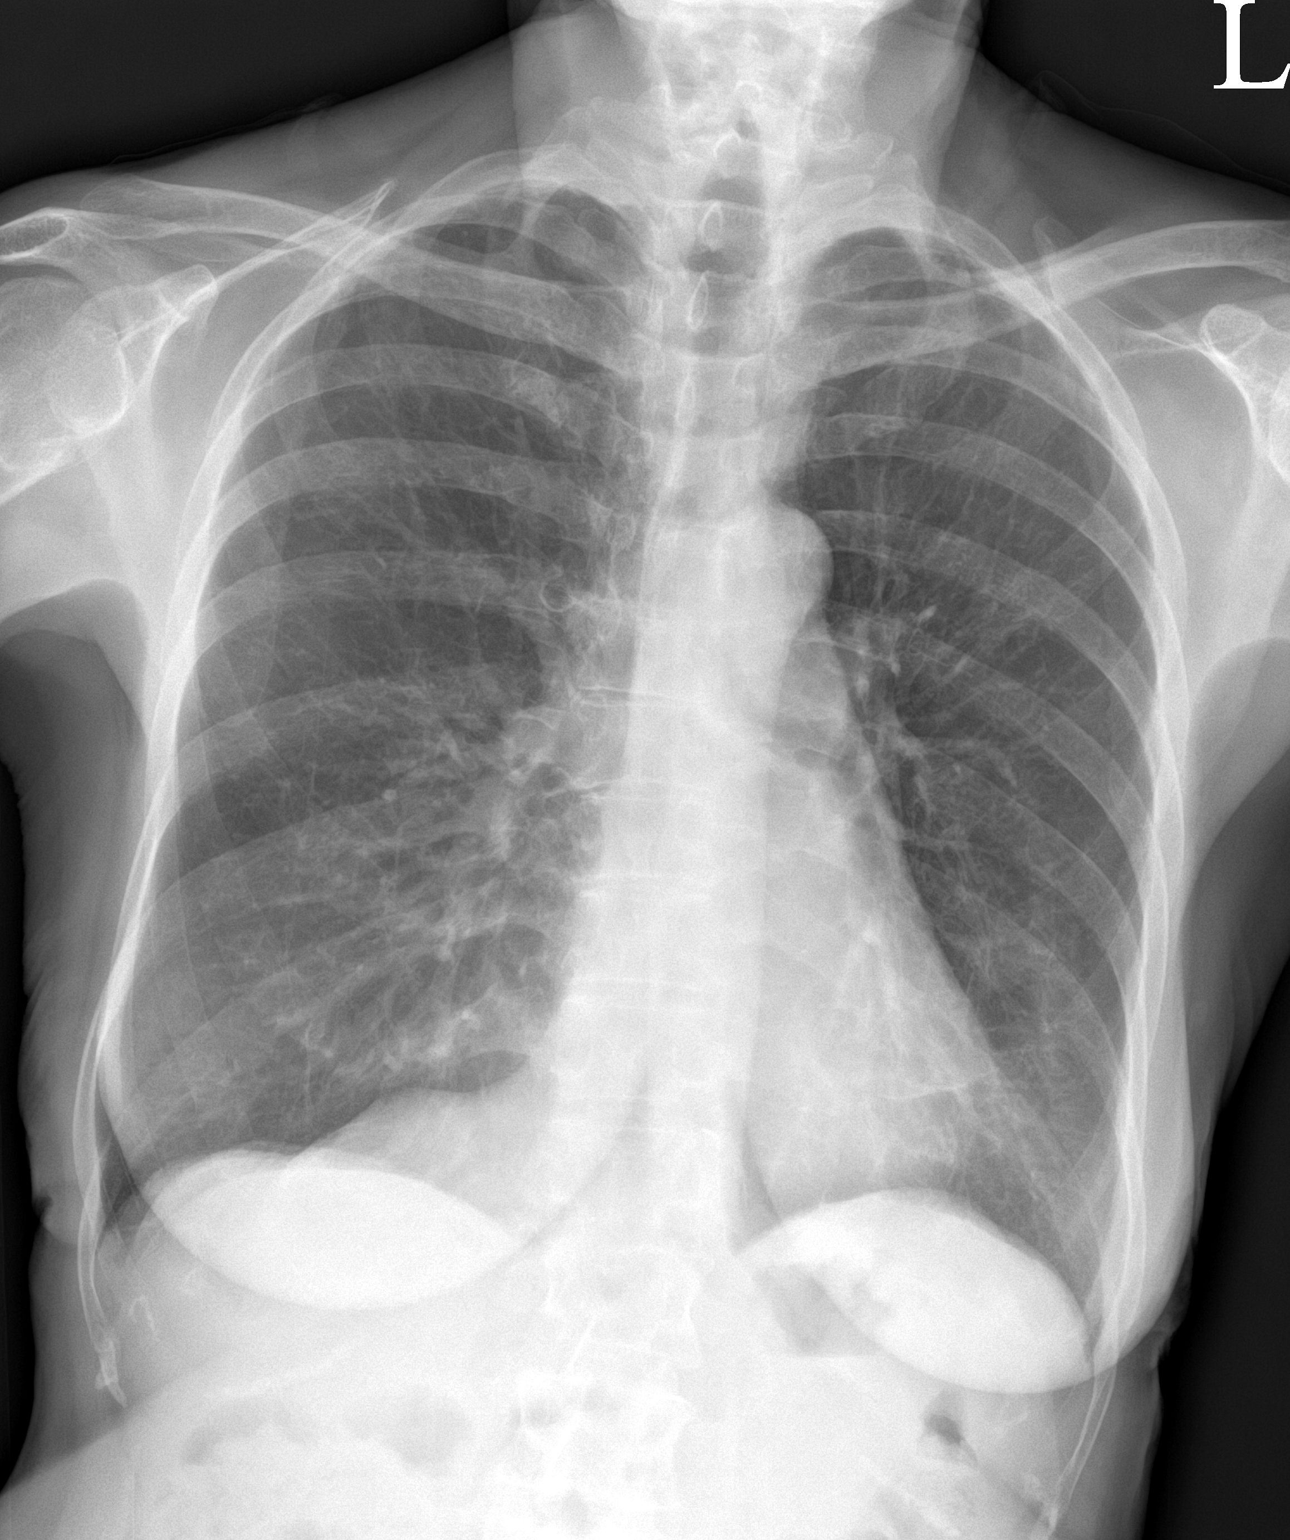

[chest lat]
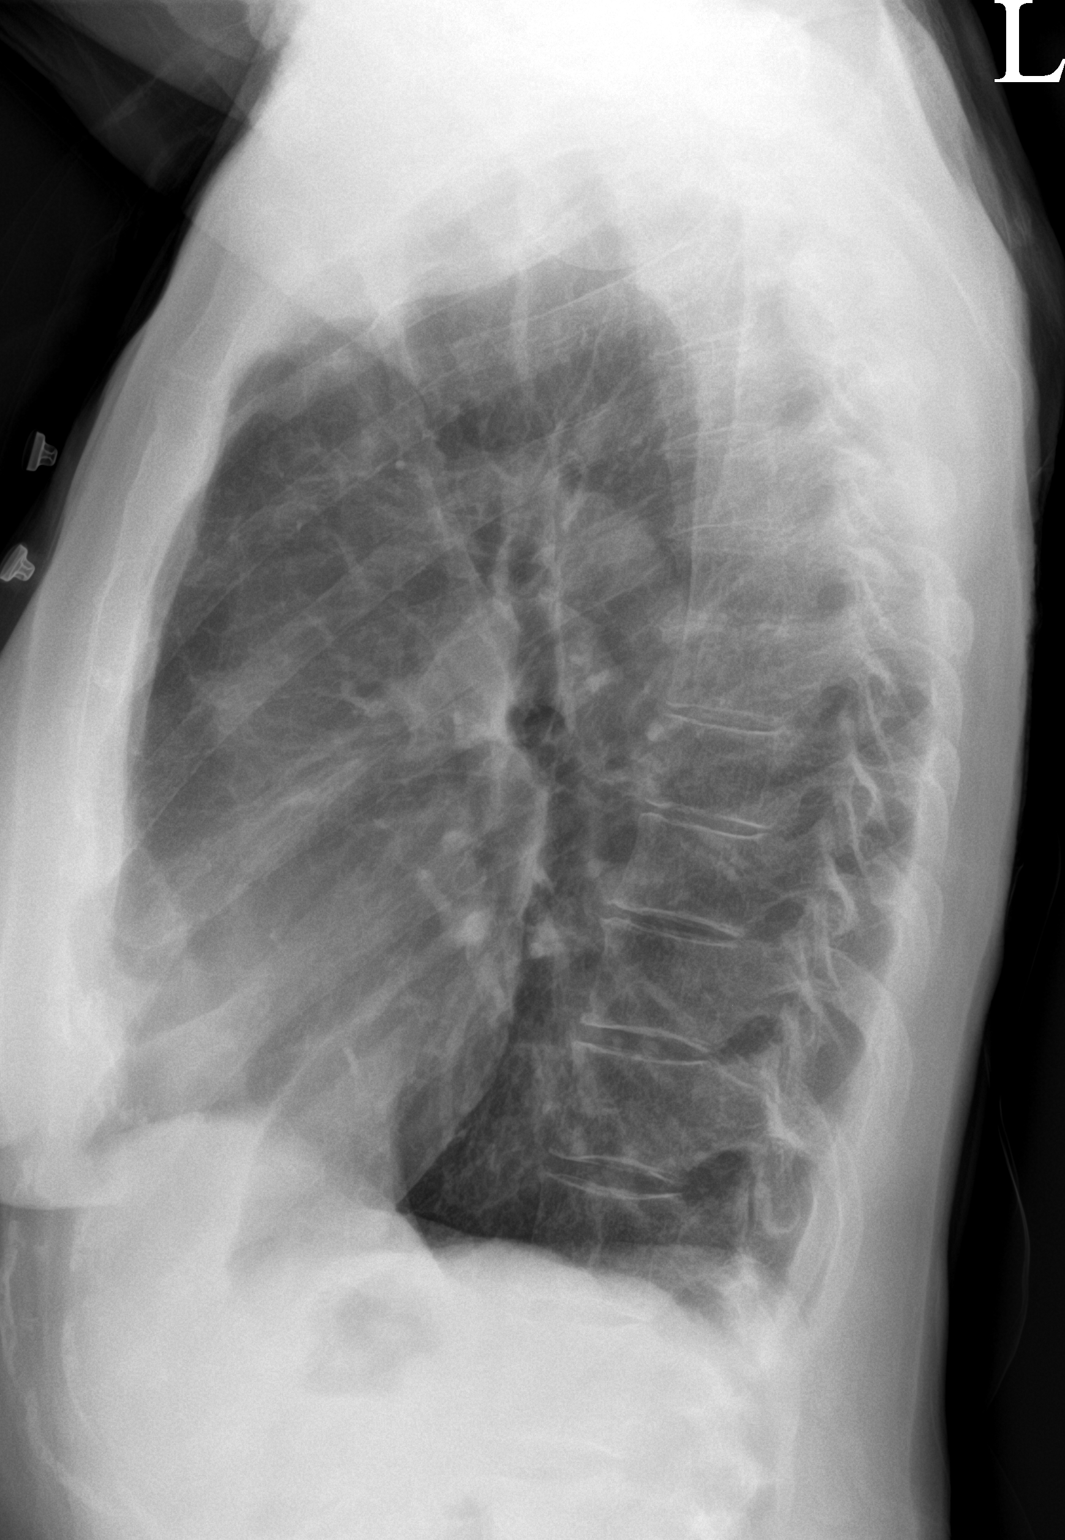

[2 of 2 positions shown; findings below may reference images not displayed]

FINDINGS: Cardiac shadows within normal limits. The lungs are hyperinflated
bilaterally. Previously seen patchy opacities in the left lung have
resolved in the interval. No focal infiltrate is seen. No bony
abnormality is noted.
IMPRESSION: COPD without acute abnormality.

## 2023-08-04 ENCOUNTER — Other Ambulatory Visit (HOSPITAL_COMMUNITY): Payer: Self-pay

## 2023-08-04 ENCOUNTER — Ambulatory Visit (INDEPENDENT_AMBULATORY_CARE_PROVIDER_SITE_OTHER): Payer: Medicare Other | Admitting: Allergy and Immunology

## 2023-08-04 VITALS — BP 104/62 | HR 87 | Temp 98.3°F | Resp 16 | Ht 63.0 in | Wt 122.0 lb

## 2023-08-04 DIAGNOSIS — K219 Gastro-esophageal reflux disease without esophagitis: Secondary | ICD-10-CM | POA: Diagnosis not present

## 2023-08-04 DIAGNOSIS — J479 Bronchiectasis, uncomplicated: Secondary | ICD-10-CM

## 2023-08-04 DIAGNOSIS — D839 Common variable immunodeficiency, unspecified: Secondary | ICD-10-CM | POA: Diagnosis not present

## 2023-08-04 DIAGNOSIS — R296 Repeated falls: Secondary | ICD-10-CM | POA: Diagnosis not present

## 2023-08-04 DIAGNOSIS — G35 Multiple sclerosis: Secondary | ICD-10-CM | POA: Diagnosis not present

## 2023-08-04 DIAGNOSIS — J3802 Paralysis of vocal cords and larynx, bilateral: Secondary | ICD-10-CM | POA: Diagnosis not present

## 2023-08-04 DIAGNOSIS — R260 Ataxic gait: Secondary | ICD-10-CM | POA: Diagnosis not present

## 2023-08-04 DIAGNOSIS — S42211D Unspecified displaced fracture of surgical neck of right humerus, subsequent encounter for fracture with routine healing: Secondary | ICD-10-CM | POA: Diagnosis not present

## 2023-08-04 MED ORDER — FORMOTEROL FUMARATE 20 MCG/2ML IN NEBU
20.0000 ug | INHALATION_SOLUTION | Freq: Every evening | RESPIRATORY_TRACT | 1 refills | Status: DC
  Filled 2023-08-04: qty 360, 180d supply, fill #0

## 2023-08-04 MED ORDER — BUDESONIDE 0.5 MG/2ML IN SUSP
0.5000 mg | Freq: Two times a day (BID) | RESPIRATORY_TRACT | 1 refills | Status: DC
  Filled 2023-08-04: qty 120, 30d supply, fill #0

## 2023-08-04 MED ORDER — NEBULIZER MASK ADULT MISC
1.0000 | 2 refills | Status: AC
Start: 2023-08-04 — End: ?
  Filled 2023-08-04: qty 1, fill #0

## 2023-08-04 MED ORDER — YUPELRI 175 MCG/3ML IN SOLN
175.0000 ug | Freq: Every morning | RESPIRATORY_TRACT | 1 refills | Status: DC
  Filled 2023-08-04: qty 270, 90d supply, fill #0

## 2023-08-04 MED ORDER — ALBUTEROL SULFATE (2.5 MG/3ML) 0.083% IN NEBU
2.5000 mg | INHALATION_SOLUTION | RESPIRATORY_TRACT | 1 refills | Status: DC
  Filled 2023-08-04: qty 180, 15d supply, fill #0

## 2023-08-04 NOTE — Progress Notes (Unsigned)
 Kayla [name] - [place] - [place] - [place] - [place]   Follow-up Note  Referring Provider: Arcadio Knuckles, MD Primary Provider: Arcadio Knuckles, MD Date of Office Visit: 08/04/2023  Subjective:   Kayla [name] (DOB: 04-03-1950) is a 73 y.o. female who [facility] in re-evaluation of the following:  HPI: Kayla [name] returns to this clinic in reevaluation of CVID treated with subcutaneous immunoglobulin in the context of rituximab use for MS, bronchiectasis, vocal cord paralysis, reflux, nocturnal deoxygenation.  I last saw her in this clinic 12 May 2023.  She has really done very well since her last visit.  She has some cough and throat clearing but she is much improved.  This is the best she has felt in over a year.  She is exercising almost on a daily basis.  She continues to use her budesonide  and her formoterol  just 1 time per day at this point and also continues on Yupelri  just 1 time per day.  She continues on oxygen  at nighttime.  Her reflux is under good control on her current plan.  She continues on immunoglobulin infusions without adverse effect.  She has not required a antibiotic or systemic steroid for any type of airway issue.  Allergies as of 08/04/2023       Reactions   Contrast Media [iodinated Contrast Media] Rash   Iohexol  Hives   Methylprednisolone  Other (See Comments)   Decreases pts heart rate    Morphine  And Codeine  Other (See Comments)   Hallucinations    Tramadol  Other (See Comments)   Hallucinations   Tyloxapol Other (See Comments)   Hydrocodone  Itching   Oxycodone  Rash   Oxycodone -acetaminophen  Rash, Other (See Comments)   Hallucinations, also   Phenytoin Sodium Extended Rash   Tizanidine Hcl Rash   Zanaflex [tizanidine Hydrochloride] Rash        Medication List    ACCRUFeR  30 MG Caps Generic drug: Ferric Maltol  Take 1 capsule (30 mg total) by mouth in the morning and at bedtime.    albuterol  (2.5 MG/3ML) 0.083% nebulizer solution Commonly known as: PROVENTIL  Take 3 mLs (2.5 mg total) by nebulization every 4-6 hours as needed for shortness of breath and wheezing   amphetamine -dextroamphetamine  20 MG tablet Commonly known as: ADDERALL Take 20 mg by mouth 2 (two) times daily.   azithromycin  250 MG tablet Commonly known as: ZITHROMAX  Take 1 tablet (250 mg total) by mouth daily.   baclofen  10 MG tablet Commonly known as: LIORESAL  Take 10 mg by mouth in the morning.   bisacodyl  5 MG EC tablet Commonly known as: DULCOLAX Take 5 mg by mouth See admin instructions. Take 5 mg by mouth in the morning and evening   budesonide  0.5 MG/2ML nebulizer solution Commonly known as: PULMICORT  USE 1 VIAL  IN  NEBULIZER TWICE  DAILY - Rinse Mouth After Treatment   calcium  carbonate 1250 (500 Ca) MG tablet Commonly known as: OS-CAL - dosed in mg of elemental calcium  Take 1 tablet (1,250 mg total) by mouth daily with breakfast.   dexlansoprazole  60 MG capsule Commonly known as: DEXILANT  TAKE 1 CAPSULE BY MOUTH ONCE DAILY WITH BREAKFAST   famotidine  20 MG tablet Commonly known as: Pepcid  One after supper   fluconazole  200 MG tablet Commonly known as: DIFLUCAN  Take 200 mg by mouth once a week.   FLUoxetine  40 MG capsule Commonly known as: PROZAC  Take 1 capsule (40 mg total) by mouth daily.   Flutter Kayla [name]  Use as directed   formoterol  20 MCG/2ML nebulizer solution Commonly known as: Perforomist  USE 1 VIAL  IN  NEBULIZER TWICE  DAILY - Morning and Evening   guaiFENesin  600 MG 12 hr tablet Commonly known as: MUCINEX  Take 1 tablet (600 mg total) by mouth 2 (two) times daily.   levothyroxine  100 MCG tablet Commonly known as: SYNTHROID  TAKE 1 TABLET BY MOUTH ONCE DAILY BEFORE BREAKFAST   MAGNESIUM  PO Take 1 tablet by mouth every evening.   methenamine  1 g tablet Commonly known as: HIPREX  Take 1 tablet (1 g total) by mouth 2 (two) times daily with a meal.    mirtazapine  15 MG tablet Commonly known as: REMERON  TAKE ONE TABLET BY MOUTH AT BEDTIME   Myrbetriq  50 MG Tb24 tablet Generic drug: mirabegron  ER Take 50 mg by mouth in the morning.   NON FORMULARY Take 1 tablet by mouth See admin instructions. [employer] Mature Multivitamin with Calcium - Take 1 tablet by mouth with breakfast in the morning   NON FORMULARY Take 1 capsule by mouth See admin instructions. [employer] probiotic advanced  digestive probiotic- Take 1 capsule by mouth in the morning   OXYGEN  Inhale 2-3.5 L/min into the lungs continuous.   PRESCRIPTION MEDICATION Apply 1 Device topically See admin instructions. Afflovest- For 30 minutes in the morning and evening- to begin @ 60% and work up to 80%   sodium chloride  HYPERTONIC 3 % nebulizer solution Take by nebulization 2 (two) times daily as needed for cough. Dx J44.9   tamsulosin  0.4 MG Caps capsule Commonly known as: FLOMAX  Take 0.4 mg by mouth in the morning.   temazepam  15 MG capsule Commonly known as: RESTORIL  Take 1 capsule (15 mg total) by mouth every evening.   torsemide  10 MG tablet Commonly known as: DEMADEX  Take 1 tablet (10 mg total) by mouth daily.   Vitaline CoQ10 300 MG Wafr Generic drug: Coenzyme Q10 Take 300 mg by mouth in the morning.   Yupelri  175 MCG/3ML nebulizer solution Generic drug: revefenacin  USE 1 VIAL IN NEBULIZER DAILY    Past Medical History:  Diagnosis Date   Arthritis    oa   Asthma    Bilateral vocal cord paralysis    Bronchiectasis (HCC)    Cervical cancer (HCC) 2005   Depression    GERD (gastroesophageal reflux disease)    Hiatal hernia    Hypothyroidism    Immunodeficiency (HCC)    Gets IVIG infusions   Insomnia    Multiple sclerosis (HCC) 1996   Osteoporosis    Pneumonia 2024, 2014, 1992   Urine retention     Past Surgical History:  Procedure Laterality Date   ABDOMINAL HYSTERECTOMY  03/25/2003   for cervical cancer, complete   BRONCHIAL BIOPSY   10/26/2020   Procedure: BRONCHIAL BIOPSIES;  Surgeon: Prudy Brownie, DO;  Location: [facility];  Service: Pulmonary;;   BRONCHIAL BRUSHINGS  10/26/2020   Procedure: BRONCHIAL BRUSHINGS;  Surgeon: Prudy Brownie, DO;  Location: [facility];  Service: Pulmonary;;   BRONCHIAL WASHINGS  10/26/2020   Procedure: BRONCHIAL WASHINGS;  Surgeon: Prudy Brownie, DO;  Location: [facility];  Service: Pulmonary;;   BRONCHIAL WASHINGS  08/19/2022   Procedure: BRONCHIAL WASHINGS;  Surgeon: Prudy Brownie, DO;  Location: [facility];  Service: Cardiopulmonary;;   CATARACT EXTRACTION, BILATERAL Bilateral    lens for cataracts   CONVERSION TO TOTAL HIP Right 09/18/2015   Procedure: CONVERSION OF RIGHT HEMI ARTHROPLASTY TO TOTAL HIP ARTHROPLASTY ACETABULAR REVISON ;  Surgeon:  Claiborne Crew, MD;  Location: [facility];  Service: Orthopedics;  Laterality: Right;   HIP ARTHROPLASTY Right 03/01/2013   Procedure: ARTHROPLASTY BIPOLAR HIP;  Surgeon: Bevin Bucks, MD;  Location: [facility];  Service: Orthopedics;  Laterality: Right;   HIP CLOSED REDUCTION Right 03/12/2013   Procedure: CLOSED REDUCTION HIP;  Surgeon: Aurther Blue, MD;  Location: [facility];  Service: Orthopedics;  Laterality: Right;   HIP CLOSED REDUCTION Right 03/19/2013   Procedure: CLOSED REDUCTION HIP;  Surgeon: Loel Ring, MD;  Location: [facility];  Service: Orthopedics;  Laterality: Right;   HIP CLOSED REDUCTION Right 11/10/2015   Procedure: CLOSED MANIPULATION HIP;  Surgeon: Adonica Hoose, MD;  Location: [facility];  Service: Orthopedics;  Laterality: Right;   HIP CLOSED REDUCTION Right 06/05/2017   Procedure: CLOSED REDUCTION HIP;  Surgeon: Hazle Lites, MD;  Location: [facility];  Service: Orthopedics;  Laterality: Right;   SMALL INTESTINE SURGERY     TEE WITHOUT CARDIOVERSION N/A 09/22/2022   Procedure: TRANSESOPHAGEAL ECHOCARDIOGRAM;  Surgeon: Elmyra Haggard, MD;  Location: [facility];  Service: Cardiovascular;  Laterality: N/A;    TOTAL HIP REVISION Right 06/06/2017   Procedure: TOTAL HIP REVISION;  Surgeon: Hazle Lites, MD;  Location: [facility];  Service: Orthopedics;  Laterality: Right;   VESICOVAGINAL FISTULA CLOSURE W/ TAH  03/25/2003   VIDEO BRONCHOSCOPY N/A 10/26/2020   Procedure: VIDEO BRONCHOSCOPY WITH FLUORO;  Surgeon: Prudy Brownie, DO;  Location: [facility];  Service: Pulmonary;  Laterality: N/A;   VIDEO BRONCHOSCOPY Left 08/19/2022   Procedure: VIDEO BRONCHOSCOPY WITH FLUORO; WITH BAL;  Surgeon: Prudy Brownie, DO;  Location: [facility];  Service: Cardiopulmonary;  Laterality: Left;    Review of systems negative except as noted in HPI / PMHx or noted below:  Review of Systems  Constitutional: Negative.   HENT: Negative.    Eyes: Negative.   Respiratory: Negative.    Cardiovascular: Negative.   Gastrointestinal: Negative.   Genitourinary: Negative.   Musculoskeletal: Negative.   Skin: Negative.   Neurological: Negative.   Endo/Heme/Allergies: Negative.   Psychiatric/Behavioral: Negative.       Objective:   Vitals:   08/04/23 1528  BP: 104/62  Pulse: 87  Resp: 16  Temp: 98.3 F (36.8 C)  SpO2: 96%   Height: 5\' 3"  (160 cm)  Weight: 122 lb (55.3 kg)   Physical Exam Constitutional:      Appearance: She is not diaphoretic.  HENT:     Head: Normocephalic.     Right Ear: Tympanic membrane, ear canal and external ear normal.     Left Ear: Tympanic membrane, ear canal and external ear normal.     Nose: Nose normal. No mucosal edema or rhinorrhea.     Mouth/Throat:     Pharynx: Uvula midline. No oropharyngeal exudate.  Eyes:     Conjunctiva/sclera: Conjunctivae normal.  Neck:     Thyroid : No thyromegaly.     Trachea: Trachea normal. No tracheal tenderness or tracheal deviation.  Cardiovascular:     Rate and Rhythm: Normal rate and regular rhythm.     Heart sounds: Normal heart sounds, S1 normal and S2 normal. No murmur heard. Pulmonary:     Effort: No respiratory distress.      Breath sounds: Normal breath sounds. No stridor. No wheezing or rales.  Lymphadenopathy:     Head:     Right side of head: No tonsillar adenopathy.     Left side of head: No tonsillar adenopathy.  Cervical: No cervical adenopathy.  Skin:    Findings: No erythema or rash.     Nails: There is no clubbing.  Neurological:     Mental Status: She is alert.     Diagnostics: none   Assessment and Plan:   1. CVID (common variable immunodeficiency) (HCC)   2. Bronchiectasis without complication (HCC)   3. Bilateral vocal cord paralysis   4. Gastroesophageal reflux disease, unspecified whether esophagitis present    1. Continue immunoglobulin infusions weekly - 9 gm   2.  Continue to treat inflammation of airway:   A. Budesonide  5 mg - nebulize 1-2 times per day  B. Formoterol  - nebulize 1- times per day  C. Yupelri  - nebulize 1 time per day  3. If needed:   A. Albuterol  nebulization every 4-6 hours  B. Percussion vest few times per day to remove mucous  4. Treat reflux with the following:   A. Continue Dexilant  and famotidine    5. Continue oxygen   6. Continue exercise program  7. Influenza = Tamiflu. Covid = Paxlovid   8. Check blood - IgA/G/M  9. Return to clinic in 6 months or earlier if problem  Nalanie appears to be doing very well and we will keep her on a collection of anti-inflammatory agents for her airway, some airway hygiene maneuvers, immunoglobulin infusions, and therapy directed against reflux.  We will check her immunoglobulin levels today.  I will contact her by telephone with the results of those tests.  Schuyler Custard, MD Allergy  / Immunology [facility]

## 2023-08-04 NOTE — Patient Instructions (Addendum)
  1. Continue immunoglobulin infusions weekly - 9 gm   2.  Continue to treat inflammation of airway:   A. Budesonide  5 mg - nebulize 1-2 times per day  B. Formoterol  - nebulize 1- times per day  C. Yupelri  - nebulize 1 time per day  3. If needed:   A. Albuterol  nebulization every 4-6 hours  B. Percussion vest few times per day to remove mucous  4. Treat reflux with the following:   A. Continue Dexilant  and famotidine    5. Continue oxygen   6. Continue exercise program  7. Influenza = Tamiflu. Covid = Paxlovid   8. Check blood - IgA/G/M  9. Return to clinic in 6 months or earlier if problem

## 2023-08-05 ENCOUNTER — Ambulatory Visit: Payer: Self-pay | Admitting: Allergy and Immunology

## 2023-08-05 ENCOUNTER — Encounter: Payer: Self-pay | Admitting: Allergy and Immunology

## 2023-08-05 ENCOUNTER — Other Ambulatory Visit (HOSPITAL_COMMUNITY): Payer: Self-pay

## 2023-08-05 LAB — IGG, IGA, IGM
IgA/Immunoglobulin A, Serum: 52 mg/dL — ABNORMAL LOW (ref 64–422)
IgG (Immunoglobin G), Serum: 1401 mg/dL (ref 586–1602)
IgM (Immunoglobulin M), Srm: 26 mg/dL (ref 26–217)

## 2023-08-06 ENCOUNTER — Other Ambulatory Visit (HOSPITAL_COMMUNITY): Payer: Self-pay

## 2023-08-06 DIAGNOSIS — R296 Repeated falls: Secondary | ICD-10-CM | POA: Diagnosis not present

## 2023-08-06 DIAGNOSIS — S42211D Unspecified displaced fracture of surgical neck of right humerus, subsequent encounter for fracture with routine healing: Secondary | ICD-10-CM | POA: Diagnosis not present

## 2023-08-06 DIAGNOSIS — R260 Ataxic gait: Secondary | ICD-10-CM | POA: Diagnosis not present

## 2023-08-06 DIAGNOSIS — G35 Multiple sclerosis: Secondary | ICD-10-CM | POA: Diagnosis not present

## 2023-08-07 ENCOUNTER — Other Ambulatory Visit (HOSPITAL_COMMUNITY): Payer: Self-pay

## 2023-08-11 ENCOUNTER — Encounter: Payer: Self-pay | Admitting: Internal Medicine

## 2023-08-11 DIAGNOSIS — R260 Ataxic gait: Secondary | ICD-10-CM | POA: Diagnosis not present

## 2023-08-11 DIAGNOSIS — S42211D Unspecified displaced fracture of surgical neck of right humerus, subsequent encounter for fracture with routine healing: Secondary | ICD-10-CM | POA: Diagnosis not present

## 2023-08-11 DIAGNOSIS — G35 Multiple sclerosis: Secondary | ICD-10-CM | POA: Diagnosis not present

## 2023-08-11 DIAGNOSIS — R296 Repeated falls: Secondary | ICD-10-CM | POA: Diagnosis not present

## 2023-08-12 ENCOUNTER — Other Ambulatory Visit: Payer: Self-pay | Admitting: Internal Medicine

## 2023-08-12 ENCOUNTER — Other Ambulatory Visit (HOSPITAL_COMMUNITY): Payer: Self-pay

## 2023-08-12 DIAGNOSIS — R600 Localized edema: Secondary | ICD-10-CM

## 2023-08-13 DIAGNOSIS — R296 Repeated falls: Secondary | ICD-10-CM | POA: Diagnosis not present

## 2023-08-13 DIAGNOSIS — R260 Ataxic gait: Secondary | ICD-10-CM | POA: Diagnosis not present

## 2023-08-13 DIAGNOSIS — G35 Multiple sclerosis: Secondary | ICD-10-CM | POA: Diagnosis not present

## 2023-08-13 DIAGNOSIS — S42211D Unspecified displaced fracture of surgical neck of right humerus, subsequent encounter for fracture with routine healing: Secondary | ICD-10-CM | POA: Diagnosis not present

## 2023-08-16 ENCOUNTER — Other Ambulatory Visit: Payer: Self-pay | Admitting: Internal Medicine

## 2023-08-18 ENCOUNTER — Other Ambulatory Visit: Payer: Self-pay | Admitting: Internal Medicine

## 2023-08-18 DIAGNOSIS — R296 Repeated falls: Secondary | ICD-10-CM | POA: Diagnosis not present

## 2023-08-18 DIAGNOSIS — G35 Multiple sclerosis: Secondary | ICD-10-CM | POA: Diagnosis not present

## 2023-08-18 DIAGNOSIS — K2100 Gastro-esophageal reflux disease with esophagitis, without bleeding: Secondary | ICD-10-CM

## 2023-08-18 DIAGNOSIS — R1314 Dysphagia, pharyngoesophageal phase: Secondary | ICD-10-CM

## 2023-08-18 DIAGNOSIS — R260 Ataxic gait: Secondary | ICD-10-CM | POA: Diagnosis not present

## 2023-08-18 DIAGNOSIS — S42211D Unspecified displaced fracture of surgical neck of right humerus, subsequent encounter for fracture with routine healing: Secondary | ICD-10-CM | POA: Diagnosis not present

## 2023-08-18 DIAGNOSIS — F3341 Major depressive disorder, recurrent, in partial remission: Secondary | ICD-10-CM

## 2023-08-19 ENCOUNTER — Encounter: Payer: Self-pay | Admitting: Internal Medicine

## 2023-08-20 ENCOUNTER — Other Ambulatory Visit: Payer: Self-pay

## 2023-08-20 DIAGNOSIS — R296 Repeated falls: Secondary | ICD-10-CM | POA: Diagnosis not present

## 2023-08-20 DIAGNOSIS — S42211D Unspecified displaced fracture of surgical neck of right humerus, subsequent encounter for fracture with routine healing: Secondary | ICD-10-CM | POA: Diagnosis not present

## 2023-08-20 DIAGNOSIS — G35 Multiple sclerosis: Secondary | ICD-10-CM | POA: Diagnosis not present

## 2023-08-20 DIAGNOSIS — F3341 Major depressive disorder, recurrent, in partial remission: Secondary | ICD-10-CM

## 2023-08-20 DIAGNOSIS — R260 Ataxic gait: Secondary | ICD-10-CM | POA: Diagnosis not present

## 2023-08-20 DIAGNOSIS — K2100 Gastro-esophageal reflux disease with esophagitis, without bleeding: Secondary | ICD-10-CM

## 2023-08-20 DIAGNOSIS — R1314 Dysphagia, pharyngoesophageal phase: Secondary | ICD-10-CM

## 2023-08-20 MED ORDER — FLUOXETINE HCL 40 MG PO CAPS: 40.0000 mg | ORAL_CAPSULE | Freq: Every day | ORAL | 0 refills | Status: DC

## 2023-08-20 MED ORDER — DEXLANSOPRAZOLE 60 MG PO CPDR: 1.0000 | DELAYED_RELEASE_CAPSULE | Freq: Every day | ORAL | 0 refills | Status: DC

## 2023-08-21 ENCOUNTER — Other Ambulatory Visit: Payer: Self-pay | Admitting: Internal Medicine

## 2023-08-31 DIAGNOSIS — S42211D Unspecified displaced fracture of surgical neck of right humerus, subsequent encounter for fracture with routine healing: Secondary | ICD-10-CM | POA: Diagnosis not present

## 2023-08-31 DIAGNOSIS — R260 Ataxic gait: Secondary | ICD-10-CM | POA: Diagnosis not present

## 2023-08-31 DIAGNOSIS — G35 Multiple sclerosis: Secondary | ICD-10-CM | POA: Diagnosis not present

## 2023-08-31 DIAGNOSIS — R296 Repeated falls: Secondary | ICD-10-CM | POA: Diagnosis not present

## 2023-09-02 DIAGNOSIS — R296 Repeated falls: Secondary | ICD-10-CM | POA: Diagnosis not present

## 2023-09-02 DIAGNOSIS — R260 Ataxic gait: Secondary | ICD-10-CM | POA: Diagnosis not present

## 2023-09-02 DIAGNOSIS — G35 Multiple sclerosis: Secondary | ICD-10-CM | POA: Diagnosis not present

## 2023-09-02 DIAGNOSIS — S42211D Unspecified displaced fracture of surgical neck of right humerus, subsequent encounter for fracture with routine healing: Secondary | ICD-10-CM | POA: Diagnosis not present

## 2023-09-07 DIAGNOSIS — S42211D Unspecified displaced fracture of surgical neck of right humerus, subsequent encounter for fracture with routine healing: Secondary | ICD-10-CM | POA: Diagnosis not present

## 2023-09-07 DIAGNOSIS — G35 Multiple sclerosis: Secondary | ICD-10-CM | POA: Diagnosis not present

## 2023-09-07 DIAGNOSIS — R296 Repeated falls: Secondary | ICD-10-CM | POA: Diagnosis not present

## 2023-09-07 DIAGNOSIS — R260 Ataxic gait: Secondary | ICD-10-CM | POA: Diagnosis not present

## 2023-09-09 DIAGNOSIS — S42211D Unspecified displaced fracture of surgical neck of right humerus, subsequent encounter for fracture with routine healing: Secondary | ICD-10-CM | POA: Diagnosis not present

## 2023-09-09 DIAGNOSIS — G35 Multiple sclerosis: Secondary | ICD-10-CM | POA: Diagnosis not present

## 2023-09-09 DIAGNOSIS — R296 Repeated falls: Secondary | ICD-10-CM | POA: Diagnosis not present

## 2023-09-09 DIAGNOSIS — R260 Ataxic gait: Secondary | ICD-10-CM | POA: Diagnosis not present

## 2023-09-10 ENCOUNTER — Other Ambulatory Visit: Payer: Self-pay

## 2023-09-10 DIAGNOSIS — D508 Other iron deficiency anemias: Secondary | ICD-10-CM

## 2023-09-10 MED ORDER — ACCRUFER 30 MG PO CAPS: 1.0000 | ORAL_CAPSULE | Freq: Two times a day (BID) | ORAL | 1 refills | Status: DC

## 2023-09-14 DIAGNOSIS — S42211D Unspecified displaced fracture of surgical neck of right humerus, subsequent encounter for fracture with routine healing: Secondary | ICD-10-CM | POA: Diagnosis not present

## 2023-09-14 DIAGNOSIS — R260 Ataxic gait: Secondary | ICD-10-CM | POA: Diagnosis not present

## 2023-09-14 DIAGNOSIS — R296 Repeated falls: Secondary | ICD-10-CM | POA: Diagnosis not present

## 2023-09-14 DIAGNOSIS — G35 Multiple sclerosis: Secondary | ICD-10-CM | POA: Diagnosis not present

## 2023-09-21 DIAGNOSIS — G35 Multiple sclerosis: Secondary | ICD-10-CM | POA: Diagnosis not present

## 2023-09-21 DIAGNOSIS — R296 Repeated falls: Secondary | ICD-10-CM | POA: Diagnosis not present

## 2023-09-21 DIAGNOSIS — M25551 Pain in right hip: Secondary | ICD-10-CM | POA: Diagnosis not present

## 2023-09-21 DIAGNOSIS — R260 Ataxic gait: Secondary | ICD-10-CM | POA: Diagnosis not present

## 2023-09-21 DIAGNOSIS — S42211D Unspecified displaced fracture of surgical neck of right humerus, subsequent encounter for fracture with routine healing: Secondary | ICD-10-CM | POA: Diagnosis not present

## 2023-09-23 DIAGNOSIS — R296 Repeated falls: Secondary | ICD-10-CM | POA: Diagnosis not present

## 2023-09-23 DIAGNOSIS — R260 Ataxic gait: Secondary | ICD-10-CM | POA: Diagnosis not present

## 2023-09-23 DIAGNOSIS — S42211D Unspecified displaced fracture of surgical neck of right humerus, subsequent encounter for fracture with routine healing: Secondary | ICD-10-CM | POA: Diagnosis not present

## 2023-09-23 DIAGNOSIS — G35 Multiple sclerosis: Secondary | ICD-10-CM | POA: Diagnosis not present

## 2023-09-28 DIAGNOSIS — R260 Ataxic gait: Secondary | ICD-10-CM | POA: Diagnosis not present

## 2023-09-28 DIAGNOSIS — S42211D Unspecified displaced fracture of surgical neck of right humerus, subsequent encounter for fracture with routine healing: Secondary | ICD-10-CM | POA: Diagnosis not present

## 2023-09-28 DIAGNOSIS — R296 Repeated falls: Secondary | ICD-10-CM | POA: Diagnosis not present

## 2023-09-28 DIAGNOSIS — G35 Multiple sclerosis: Secondary | ICD-10-CM | POA: Diagnosis not present

## 2023-09-30 ENCOUNTER — Ambulatory Visit (INDEPENDENT_AMBULATORY_CARE_PROVIDER_SITE_OTHER): Admitting: Internal Medicine

## 2023-09-30 ENCOUNTER — Encounter: Payer: Self-pay | Admitting: Internal Medicine

## 2023-09-30 VITALS — BP 106/64 | HR 78 | Ht 63.0 in | Wt 126.0 lb

## 2023-09-30 DIAGNOSIS — R260 Ataxic gait: Secondary | ICD-10-CM | POA: Diagnosis not present

## 2023-09-30 DIAGNOSIS — J3802 Paralysis of vocal cords and larynx, bilateral: Secondary | ICD-10-CM

## 2023-09-30 DIAGNOSIS — G35 Multiple sclerosis: Secondary | ICD-10-CM | POA: Diagnosis not present

## 2023-09-30 DIAGNOSIS — Z8669 Personal history of other diseases of the nervous system and sense organs: Secondary | ICD-10-CM | POA: Diagnosis not present

## 2023-09-30 DIAGNOSIS — S42211D Unspecified displaced fracture of surgical neck of right humerus, subsequent encounter for fracture with routine healing: Secondary | ICD-10-CM | POA: Diagnosis not present

## 2023-09-30 DIAGNOSIS — J9611 Chronic respiratory failure with hypoxia: Secondary | ICD-10-CM | POA: Diagnosis not present

## 2023-09-30 DIAGNOSIS — R296 Repeated falls: Secondary | ICD-10-CM | POA: Diagnosis not present

## 2023-09-30 NOTE — Patient Instructions (Addendum)
 ICD-10-CM   1. Chronic respiratory failure with hypoxia (HCC)  J96.11     2. Bilateral vocal cord paralysis  J38.02     3. Multiple sclerosis (HCC)  G35     4. History of Guillain-Barre syndrome  Z86.69        Clinically doing well and doing ADLS including driving Respect your decision against tracheostomy  Plan - cotinue your nebulizers and oher medicines including weekly IvIG - Continue night oxygen   Follow-up - 6 months or sooner if needed; 15 min visit - with app

## 2023-09-30 NOTE — Progress Notes (Signed)
 [name] consult 10/25/20  73 year old female with chronic cough.  She is a longstanding patient of Dr. Ozell [name].  Since [id number] she has had multiple sclerosis.  She had a hip fracture in 2004 and after that she has used a walker.  In 2006 she had a EGD with 3 cm hiatal hernia.  She is always had a stridorous noise with deep inspiration.  She has a 15-year history of dysphagia.  2008 at esophagogram was normal.  In May 2022 she saw Dr. Vaughan [name] and was noticed to have bilateral vocal fold paralysis.  She was admitted 09/27/2020 with history of fever but also chronic cough for 4 months.  She had failed outpatient antibiotics and steroids.  CT chest showed some scattered groundglass opacities [personally visualized] particularly in the left lower lobe.  She had a blood gas 09/29/2020 without any hypercapnia.  She had a ESR of 44.  She had normal procalcitonin.  She had serology that was negative in July 2022.  During this time MRSA PCR, COVID and flu were negative.  Respiratory virus panel was also negative.  Blood cultures were no growth.  The urine Streptococcus and Legionella was negative in July 2022.  She was discharged on 09/29/2020 with outpatient follow-up with GI and pulmonary.  No antibiotics or steroids were prescribed.   At follow-up she presented with Dr. Gwendlyn [name] 10/04/2020 and GI.  At this time she was already on Dexilant  and Pepcid .  She underwent a swallow study on 10/15/2020 and it is unchanged compared to baseline.  He is able to swallow well without any aspiration using compensatory mechanisms.   She then presented via the ED on 10/23/2020 because of continued worsening of cough.  She also reports chronic bronchorrhea.  She coughs at least 10 times sputum and each time quite a bit.  It is clear in color.  Has never been measured in the sputum cup.  She complained of ongoing cough and ongoing acid reflux.  [GI endoscopy is pending at this time] she had a chest x-ray in the ED the  representative slice it is worsening compared to 1 month ago.  No CT scan available.  No leukocytosis no fever no hypoxemia.  Cough is worse when she lies flat.  EDP started her on a short course steroid orally.   She was then admitted by the hospitalist who is now consulted us .  She is frustrated by her continued symptoms.   Noted that she has baseline anemia.  Her hemoglobin in May 2022 was 11.9 g%.  But July dropped to 10.2 g%.  And currently 7.3 g%.  She has worsening anemia. Results for Kayla [name] (MRN [id number]) as of 10/24/2020 15:13   Ref. Range 09/27/2020 10:49 10/23/2020 13:28  Eosinophils Absolute Latest Ref Range: 0.0 - 0.5 K/uL 0.1 0.1   10/23/2020 -  8/3 CT chest - LLL consolidation and lingular infilktrare worse. Rt side infiltrates eter  8/4 - still with cough. D/w Dr Allene [name] - has mild insp stridor at rest due to bialteral VC paralysis. CT chest yesreday as above. PCT  < 0.1 ESR 80. Currently on pred 20mg  per day   OV NP 08/15/21  08/15/2021 Follow up : Asthma and Pneumonia  Patient returns for 1 month follow-up.  Over the last year patient has had recurrent episodes of pneumonia and bronchitis.  She was admitted in July 2022 for community-acquired pneumonia.  She was treated with IV antibiotics.  CT chest September 27, 2020  showed groundglass opacities in the right lower lobe and left lower lobe concerning for multifocal pneumonia.  She was readmitted in August 2022.  CT chest showed groundglass attenuation and waxing and waning groundglass.  Autoimmune work-up was negative.  She underwent bronchoscopy with negative fungal and AFB.  BAL cultures were positive for rare Streptococcus constellatus.  Sed rate was elevated at 80.  Treated with Antibiotics and Steroids for possible recurrent CAP vs BOOP/COP. Cytology showed reactive bronchial cells and benign lung tissue.  She was referred to immunology.  Her Ocrevus  therapy for MS was stopped due to recurrent infections.  She received 8  weeks of Rocephin .  And she is now on weekly IVIG per immunology.  Patient has recently been reestablished with a new neurologist and is undergoing new testing with MRI. Patient is on Trelegy inhaler for her underlying asthma.  Patient'[name] main complaint is she has ongoing cough and congestion. Patient has longstanding chronic cough upper airway irritation.  She has been evaluated by ENT Dr. Carlie.  She has known vocal fold paralysis, felt this is most likely due to her underlying MS.  She has intermittent stridor with this. Patient underwent modified barium swallow July 26 fifth 2022 that showed dysphagia in the pharyngeal phase.  No overt aspiration. Last visit patient was recommended on cough control regimen with Robitussin, Tessalon  and flutter valve.  Patient says she has good and bad days.  Always has an ongoing cough that is quite productive at times.  We also talked about aspiration precautions. Last office visit . Sputum culture and AFB was not adequate  Sputum Fungal + heavy candida . and later  Mycocbaterium mucogenicum CT chest Jul 29, 2021 showed mild consolidation in the right upper lobe and patchy infiltrates in the mid to lower lung field on the left.  Frothy debris in the trachea and left mainstem bronchus questionable aspiration pneumonia.  She was treated with 1 week of Diflucan  .  Patient denies any fever, discolored mucus.  Appetite is at baseline. We discussed aspiration precautions..   TEST/EVENTS :  CT chest September 27, 2020 showed groundglass opacities in the right lower lobe and left lower lobe concerning for multifocal pneumonia.  14 x 10 mm nodular density in the right middle lobe Modified barium swallow was negative   CT chest on August 3 showed groundglass attenuation consolidation with waxing and waning groundglass attenuation.  Autoimmune work-up was negative.  Fungal cultures were negative.  AFB sputum was negative.  BAL cultures showed rare Streptococcus Constellatus.   (Sensitive to ceftriaxone , levofloxacin  and vancomycin .   intermediate to penicillin).  Sed rate was elevated at 80. Cytology showed reactive bronchial cells.  Benign lung tissue.    August 2022.  CT chest showed groundglass attenuation and waxing waning groundglass.  Autoimmune work-up was negative.  Fungal cultures AFB sputum was negative.  BAL cultures showed rare Streptococcus  Constellatus, sed rate was 80.  Cytology showed reactive bronchial cells and benign lung tissue.  Patient was referred to immunology   ENT consult 09/2015 -Carlie - Vocal folds are immobile and in paramedian position. They are approximated such that voice comes out clearly but she has inspiratory stridor (ENT note - felt secondary most likely to MS    modified barium swallow July 26 fifth 2022 that showed dysphagia in the pharyngeal phase.  No overt aspiration.   CT chest Jul 29, 2021 mild apical scarring, mild consolidation in the medial aspect of the right upper lobe, patchy infiltrates in the  mid to lower left lung.   May 21, 2021 eosinophils 100   ID Consult June 2023  # Sputum cx positive for M mucogenicum/phocaicum # COPD, Cough Variant Asthma # Multiple sclerosis with associated B/L Vocal Cord Paralysis   09/28/20 respiratory cx normal respiratory flora 10/26/20 AFB smear and cx negative, strep constellatus , candida albicans ( tx with 8 weeks of IV ceftriaxone ) 07/19/21 AFB smear and cx M mucogenicum/phocaicum, candida albicans ( tx 7 days of Fluconazole )   Does not meet criteria for Pulmonary NTM infection and hence, no indication to treat  OV 11/04/2021  Subjective:  Patient ID: Kayla [name], female , DOB: Aug 12, 1950 , age 76 y.o. , MRN: [id number] , ADDRESS: [address] [place] [id number] PCP Joshua Debby CROME, MD Patient Care Team: Joshua Debby CROME, MD as PCP - General (Internal Medicine) Vicenta Purchase, MD (Neurology) Carlie Clark, MD (Otolaryngology) Dea Shiner, MD as  Consulting Physician (Infectious Diseases)  This Provider for this visit: Treatment Team:  Attending Provider: Geronimo Amel, MD    11/04/2021 -  Transfer of care from Dr Darlean to Dr Geronimo Chief Complaint  Patient presents with   Follow-up    Pt states she is about the same since last visit. States she coughs all throughout the day and will cough up phlegm that ranges from white to yellow in color. States that she also has occasional wheezing.     HPI Kayla [name] 73 y.o. - she has bilateral vocal cord paralysius Follows with DR Carlie. DEcades ago has seen Dr Felice at [facility] who recommended adipose injection but she did not enjoy the interaction. Cough been going on for many years. Ws on gbapntin for neuropathy for many years and stopped this year but this did not make any difference in cough. She has had GI eval . She has hx of potential aspiration. She has hx of PNA. LAst CT May 2023. She had PFT - she says they told her id c/w asthma but in my view  it is cw vocal cord paralysis. She has not had allergy  testing. Feno is normal.  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   For her MS she is off therapy. -0  - follows with doc [facility]   She says she is  immune suppresed and is on IvIG - Dr Dempsey Jasmine In [place]      OV 05/05/2022  Subjective:  Patient ID: Kayla [name], female , DOB: October 07, 1950 , age 58 y.o. , MRN: [id number] , ADDRESS: [address] [place] [id number] PCP Joshua Debby CROME, MD Patient Care Team: Joshua Debby CROME, MD as PCP - General (Internal Medicine) Carlie Clark, MD (Otolaryngology) Dea Shiner, MD as Consulting Physician (Infectious Diseases) Murl Cough, MD (Neurology) Jasmine, Dempsey PARAS, MD (Allergy  and Immunology)  This Provider for this visit: Treatment Team:  Attending Provider: Geronimo Amel, MD    05/05/2022 -   Chief Complaint  Patient presents with   Follow-up    Pt is  here for follow up for cough and COPD. Pt states that the cough is the same and that she is doing well. Pt is using trelelgy daily with albuterol  as needed    Associated asthma and vocal cord paralysis.  HPI Kayla [name] 73 y.o. -presents for follow-up.  I last saw her in August 2023.  After that she saw a nurse practitioner.  I referred her to Dr. Garnette Pepper right because of chronic cough and bilateral vocal cord/fold  paralysis.  I reviewed Dr. Megan notes.  His assessment was known bilateral vocal fold immobility with small trans glottal airway and troublesome cough.  He recommended conservative line of treatment.  She states with his advised she is 25% better in terms of cough shortness of breath and dysphagia.  She says she is now learned to accept the residual 75% of symptoms will still be there.  She still has some chronic fatigue.  But overall no new issues.  She did have CT scan of the chest in October 2023 and I reviewed this result.  There are some debris in the left lower lobe.  She is willing to have a follow-up CT.        FENO  Lab Results  Component Value Date   NITRICOXIDE 14 11/04/2021    History of Present Illness 08/27/22 Kayla [name] is 73 y.o. never smoker  with history of recurrent pulmonary infiltrates in setting of MS and weakened immune system.  Recommendation was to perform video fiberoptic bronchoscopy with biopsies. She underwent Flexible video fiberoptic bronchoscopy and biopsies on 08/19/2022. She was prescribed Augmentin  post bronchoscopy as Dr. Brenna felt she had a worsening pneumonia. Aerobic and anaerobic Culture showed abundant Strep Pneumoniae, BAL showed > 100,000 colonies. She states she had fever the day of her procedure and then yesterday.  Pt. States she has done well after Bronch. Cytology was negative for malignancy. Cultures were as noted above. She states recovery has been rough.She said all she did yesterday was sleep.She states her throat  also hurt, but she states that is getting better. She did not have any bleeding. She is compliant with her Augmentin  treatment.She states she does have issues swallowing, but she had a swallow eval done last year which she states was normal. She has MS which may be contributing to her issues with swallow.  She states her secretions are thick and yellow. She is not currently using her mucinex  She was taken off her Ocrevus  for her MS 2 years ago, and she stopped her IVIG about 3 months ago. She is follows with Dr. Emelia immunology. She has follow up with him in June.  She has very weak phonation and I worry that her swallow is weaker. We need to condsider repeating her swallow evaluation.    Observations/Objective: 08/19/2022 Path Review Acute inflammation and  Comment: reactive mesothelial cells. Negative for malignancy.   Aerobic/ anaerobic cultures >> Strep Pneumoniae, BAL showed > 100,000 colonies. Sensitive to Penicillins >> Pt. Was treated with Augmentin  day of procedure.  AFB : Negative Body Fluid Neutrophils>> [name] at 92, Monocyte Macrophage L at 2  Assessment and Plan: Cytology 08/19/2022 Cytology negative for malignancy BAL + for Strep Pneumoniae,> 100,000 colonies. Plan Treated with Augmentin  08/19/2022 by Dr. Brenna day of procedure.   CT Chest 08/07/2022 Progressive mucoid impaction and peribronchovascular nodularity in the lower lobes, left greater than right, suggesting repeated episodes of aspiration. Atypical infectious etiology is not excluded. 2. Cylindrical bronchiectasis. 3.  Aortic atherosclerosis (ICD10-I70.0).  Follow Up Instructions: Strep Pneumonia Recently stopped IVIG injections ( 3 months ago) Plan  He has treated you with Augmentin  through this coming Saturday 09/01/2022. Continue using Flutter valve as you have been doing.  Add Mucinex  with a full glass of water  in the mornings  Continue Trelegy daily as you have been doing. Rinse mouth after  use. See if you can move your appointment forward with Dr. Emelia to get your IVIG resumed.  Follow up with Lauraine  NP or Dr. Brenna, or Geronimo  next week in the [place] office with a CXR to ensure you are recovering from your pneumonia.  Call if you get worse not better. Consider repeat swallow eval. Please contact office for sooner follow up if symptoms do not improve or worsen or seek emergency care     xxxxxxxxxxxxxxxxxxxxxxxxxxxxxxxxxxxxxxxxxxxxxxxx[id number]   09/16/2022 Pt. Presents for hospital follow up. Upon arrival she was in respiratory distress.  Sats were  81% on RA .She was  audibly wheezing and coughing. She was gasping for air. She was using accessory muscles, nasal flaring was present, she had sternal retractions. She was not discharged with oxygen , and her sats have been very low( in the 70'[name])  for 24-48 hours per her husband. They have not been using albuterol  nebs.  She was placed on oxygen  4L, sats initially responded and then dropped again. She is now on 6 L with sats of 96%. She was given a xopenex  neb ( .63 mg/ 3cc'[name]) She has a cough which is productive with brown secretions . She has been having chills for 7-8 days.  They have not checked her temperature. She did see her PCP last Tuesday and they had a discussion about Palliative care. I am concerned she has recurrent pneumonia. Breath sounds are diminished per right base and she is in bronchospasm.  She has been doing poorly over the last week. . She had run out of her albuterol , she did have a refill, but they were not aware. They have been using 3% saline. Pt. Husband endorses that they should have gone to the ED sooner, but they were hoping to get in to see immunology this week to resume her IVIG treatments.  EMS was called and patient has been transported to [facility] on oxygen  with paramedic. She will need oxygen  at discharge and she will also need a chest vest for mobilization of  secretions/ bronchiectasis. She need immunology to resume her IVIG infusions.   Test Results: Ig levels 12/22/19 >> IgA 51, IgG 289, IgM 27 (all low) PFT 10/24/21 >> FEV1 1.59 (72%), FEV1% 71, TLC 4.92 (100%), DLCO 60%, +BD CT chest 08/11/22 >> apical scarring, cylindrical BTX more in lower lobes, mucoid impaction and peribronchovascular nodularity in lower lobes Lt > Rt CT angio chest 08/31/22 >> debris in RLL bronchus, b/l lower lobe bronchial wall thickening with multifocal mucus plugging, scattered areas of GGO b/l, RLL area of consolidation   OV 12/18/2022  Subjective:  Patient ID: Kayla [name], female , DOB: Jul 31, 1950 , age 61 y.o. , MRN: [id number] , ADDRESS: [address] [place] [id number] PCP Joshua Debby CROME, MD Patient Care Team: Joshua Debby CROME, MD as PCP - General (Internal Medicine) Carlie Clark, MD (Otolaryngology) Dea Shiner, MD as Consulting Physician (Infectious Diseases) Murl Cough, MD (Neurology) Emelia, Dempsey PARAS, MD (Allergy  and Immunology)  This Provider for this visit: Treatment Team:  Attending Provider: Geronimo Amel, MD    12/18/2022 -   Chief Complaint  Patient presents with   Follow-up    Dx with PNA in May 2024 and she states there is still pna present on last cxr done 12/09/22.  She has prod cough with white sputum. Breathing is stable as long as she is using her supplemental o2.      HPI Kayla [name] 73 y.o. -returns for follow-up.  After my last visit with her she has seen Dr. Brenna who did a bronchoscopy.  She is colonized with pneumococcus.  She then saw infectious diseases was recommended daily amoxicillin .  At this point she is taking daily amoxicillin  since August 2024.  She is here with her husband who is an independent historian.  He tells me despite amoxicillin  the cough continues.  She plans to see Dr. Garnette Franchot Silvan and ENT to see if there are any other measures for palliative relief.  Her last visit  with him per chart review was in December 2023.  She did get hospitalized between October 18, 2022 in October 24, 2018 for according to husband and also record review.  And this was for acute on chronic hypoxic respiratory failure due to recurrent streptococcal pneumonia revealing right upper lobe.  Most recent chest x-ray June December 09, 2022 does show right upper lobe infiltrate.  I discussed office check your chart with infectious disease Dr. Overton -he agreed with my recommendation to try azithromycin  250 mg once daily indefinitely.  Her QTc in July 2024 was normal.  She is on IVIG therapy for MS. She is now on oxygen  4 L since July 2024  She still able to dress shower and use a walker but things are progressively getting more difficult  After the hospitalization in August 2024 she had numbness in her feet.  This week she had EMG nerve conduction studies in [place].  She been diagnosed with superimposed DM [place] and the husband said he is actually relieved because the prognosis is not as bad as MS.   I discussed via secure chat with Dr. ONEIDA HARPER 05/06/2023  Subjective:  Patient ID: Kayla [name], female , DOB: 1950-11-24 , age 77 y.o. , MRN: [id number] , ADDRESS: [address] [place] [id number] PCP Joshua Debby CROME, MD Patient Care Team: Joshua Debby CROME, MD as PCP - General (Internal Medicine) Carlie Clark, MD (Otolaryngology) Dea Shiner, MD as Consulting Physician (Infectious Diseases) Murl Cough, MD (Neurology) Emelia Dempsey PARAS, MD (Allergy  and Immunology)  This Provider for this visit: Treatment Team:  Attending Provider: Geronimo Amel, MD    05/06/2023 -   Chief Complaint  Patient presents with   Follow-up    Breathing has improved some since her last visit. She has had some wheezing and occ cough with white sputum.      HPI Kayla [name] 73 y.o. -presents for follow-up.  Presents with her husband.  Since the pneumonia in the summer 2024  she continues to be better.  November 2024 she had a CT scan of the chest that I personally visualized and looks remarkably better compared to the summer 2024.  She continues to be stable.  In the interim she did see Dr. Garnette Franchot right and that he has advised tracheostomy.  She is hesitant but slowly coming around.  Husband has reassured her based on his impressions from talking to Dr. Silvan that this is a relatively minor procedure.  I supported the conclusion.  They will be active maintenance which the husband'[name] willing to do.  I did indicate that with progressive neuromuscular disease at some point tracheostomy would be required.  They verbalized understanding.  They want to wait till/7/25 when the youngest daughter Kayla [name] is going to get married.  They are excited about that.  I supported that as well.  In terms of her quality of life neuromuscular weakness needing walker and significant cough continues.  This despite    OV 09/30/2023  Subjective:  Patient ID: Kayla [name], female , DOB: July 02, 1950 , age  9 y.o. , MRN: [id number] , ADDRESS: [address] [place] [id number] PCP Joshua Debby CROME, MD Patient Care Team: Joshua Debby CROME, MD as PCP - General (Internal Medicine) Carlie Clark, MD (Otolaryngology) Dea Shiner, MD as Consulting Physician (Infectious Diseases) Murl Cough, MD (Neurology) Emelia Dempsey PARAS, MD (Allergy  and Immunology)  This Provider for this visit: Treatment Team:  Attending Provider: Geronimo Amel, MD    09/30/2023 -   Chief Complaint  Patient presents with   Follow-up    Breathing is overall doing well. She has started only using her o2 with sleep- 4lpm.      HPI Kayla [name] 73 y.o. -multiple sclerosis patient with a history of Guevarra presents for follow-up.  Last seen in September 2024.  At that time ENT at [facility] recommended tracheostomy but she has since decided against it.  She says she is stronger. Interim  Health status: No new complaints No new medical problems. No new surgeries. No ER visits. No Urgent care visits. No changes to medications.  She gets weekly IVIG at home.  She is on Yupelri  and formoterol  nebulizers.  She is not taking hypertonic saline.  No admissions to the hospital no pneumonia.  Using oxygen  only at night.  She is exercising.  She is working with PT.  She is also driving regular car.  She is doing water  aerobics.  No aspiration issues.  Last imaging was over the end of last year and personally visualized.  I decided against getting follow-up imaging.    Dr Felice Reflux Symptom Index (> 13- associated asthma and vocal cord paralysis.  15 suggestive of LPR cough) 0 -> 5  =  none ->severe problem 11/04/2021   Hoarseness of problem with voice 3  Clearing  Of Throat 3  Excess throat mucus or feeling of post nasal drip 5  Difficulty swallowing food, liquid or tablets 5  Cough after eating or lying down 5  Breathing difficulties or choking episodes 5  Troublesome or annoying cough 5  Sensation of something sticking in throat or lump in throat 5  Heartburn, chest pain, indigestion, or stomach acid coming up -  TOTAL 36        PFT     Latest Ref Rng & Units 10/24/2021    3:48 PM  PFT Results  FVC-Pre L 2.17   FVC-Predicted Pre % 74   FVC-Post L 2.22   FVC-Predicted Post % 76   Pre FEV1/FVC % % 50   Post FEV1/FCV % % 71   FEV1-Pre L 1.09   FEV1-Predicted Pre % 49   FEV1-Post L 1.59   DLCO uncorrected ml/min/mmHg 11.43   DLCO UNC% % 60   DLCO corrected ml/min/mmHg 11.43   DLCO COR %Predicted % 60   DLVA Predicted % 83   TLC L 4.92   TLC % Predicted % 100   RV % Predicted % 135        LAB RESULTS last 96 hours No results found.       has a past medical history of Arthritis, Asthma, Bilateral vocal cord paralysis, Bronchiectasis (HCC), Cervical cancer (HCC) (2005), Depression, GERD (gastroesophageal reflux disease), Hiatal hernia, Hypothyroidism,  Immunodeficiency (HCC), Insomnia, Multiple sclerosis (HCC) (1996), Osteoporosis, Pneumonia (2024, 2014, 1992), and Urine retention.   reports that she has never smoked. She has never used smokeless tobacco.  Past Surgical History:  Procedure Laterality Date   ABDOMINAL HYSTERECTOMY  03/25/2003   for cervical cancer, complete   BRONCHIAL  BIOPSY  10/26/2020   Procedure: BRONCHIAL BIOPSIES;  Surgeon: Brenna Adine CROME, DO;  Location: [facility];  Service: Pulmonary;;   BRONCHIAL BRUSHINGS  10/26/2020   Procedure: BRONCHIAL BRUSHINGS;  Surgeon: Brenna Adine CROME, DO;  Location: [facility];  Service: Pulmonary;;   BRONCHIAL WASHINGS  10/26/2020   Procedure: BRONCHIAL WASHINGS;  Surgeon: Brenna Adine CROME, DO;  Location: [facility];  Service: Pulmonary;;   BRONCHIAL WASHINGS  08/19/2022   Procedure: BRONCHIAL WASHINGS;  Surgeon: Brenna Adine CROME, DO;  Location: [facility];  Service: Cardiopulmonary;;   CATARACT EXTRACTION, BILATERAL Bilateral    lens for cataracts   CONVERSION TO TOTAL HIP Right 09/18/2015   Procedure: CONVERSION OF RIGHT HEMI ARTHROPLASTY TO TOTAL HIP ARTHROPLASTY ACETABULAR REVISON ;  Surgeon: Donnice Car, MD;  Location: [facility];  Service: Orthopedics;  Laterality: Right;   HIP ARTHROPLASTY Right 03/01/2013   Procedure: ARTHROPLASTY BIPOLAR HIP;  Surgeon: Donnice JONETTA Car, MD;  Location: [facility];  Service: Orthopedics;  Laterality: Right;   HIP CLOSED REDUCTION Right 03/12/2013   Procedure: CLOSED REDUCTION HIP;  Surgeon: Dempsey LULLA Moan, MD;  Location: [facility];  Service: Orthopedics;  Laterality: Right;   HIP CLOSED REDUCTION Right 03/19/2013   Procedure: CLOSED REDUCTION HIP;  Surgeon: Reyes JAYSON Billing, MD;  Location: [facility];  Service: Orthopedics;  Laterality: Right;   HIP CLOSED REDUCTION Right 11/10/2015   Procedure: CLOSED MANIPULATION HIP;  Surgeon: Redell Shoals, MD;  Location: [facility];  Service: Orthopedics;  Laterality: Right;   HIP CLOSED REDUCTION Right 06/05/2017    Procedure: CLOSED REDUCTION HIP;  Surgeon: Heide Ingle, MD;  Location: [facility];  Service: Orthopedics;  Laterality: Right;   SMALL INTESTINE SURGERY     TEE WITHOUT CARDIOVERSION N/A 09/22/2022   Procedure: TRANSESOPHAGEAL ECHOCARDIOGRAM;  Surgeon: Okey Vina LULLA, MD;  Location: [facility] CV [facility];  Service: Cardiovascular;  Laterality: N/A;   TOTAL HIP REVISION Right 06/06/2017   Procedure: TOTAL HIP REVISION;  Surgeon: Heide Ingle, MD;  Location: [facility];  Service: Orthopedics;  Laterality: Right;   VESICOVAGINAL FISTULA CLOSURE W/ TAH  03/25/2003   VIDEO BRONCHOSCOPY N/A 10/26/2020   Procedure: VIDEO BRONCHOSCOPY WITH FLUORO;  Surgeon: Brenna Adine CROME, DO;  [facility]: [facility];  Service: Pulmonary;  Laterality: N/A;   VIDEO BRONCHOSCOPY Left 08/19/2022   Procedure: VIDEO BRONCHOSCOPY WITH FLUORO; WITH BAL;  Surgeon: Brenna Adine CROME, DO;  Location: [facility];  Service: Cardiopulmonary;  Laterality: Left;    Allergies  Allergen Reactions   Contrast Media [Iodinated Contrast Media] Rash   Iohexol  Hives   Methylprednisolone  Other (See Comments)    Decreases pts heart rate    Morphine  And Codeine  Other (See Comments)    Hallucinations    Tramadol  Other (See Comments)    Hallucinations    Tyloxapol Other (See Comments)   Hydrocodone  Itching   Oxycodone  Rash   Oxycodone -Acetaminophen  Rash and Other (See Comments)    Hallucinations, also   Phenytoin Sodium Extended Rash   Tizanidine Hcl Rash   Zanaflex [Tizanidine Hydrochloride] Rash    Immunization History  Administered Date([name]) Administered   DTP 04/10/2009   Fluad Quad(high Dose 65+) 12/29/2018, 01/29/2022   Fluad Trivalent(High Dose 65+) 12/09/2022   Influenza Split 12/10/2010, 02/11/2012, 12/22/2012, 01/02/2014, 12/22/2014   Influenza Whole 02/19/2005, 03/28/2009, 01/23/2016   Influenza, High Dose Seasonal PF 02/23/2018   Influenza,inj,Quad PF,6+ Mos 02/10/2017   Influenza-Unspecified 12/02/2018, 01/08/2019,  12/22/2019, 01/15/2021, 02/05/2021   Moderna Covid-19 Vaccine  Bivalent Booster 48yrs & up 02/05/2021, 01/20/2022  Moderna Sars-Covid-2 Vaccination 04/29/2019, 06/01/2019, 01/19/2020, 08/14/2020   PNEUMOCOCCAL CONJUGATE-20 10/22/2022   Pfizer(Comirnaty )Fall Seasonal Vaccine 12 years and older 09/23/2022   Pneumococcal Conjugate-13 11/06/2015   Pneumococcal Polysaccharide-23 03/28/2009, 04/26/2019   Respiratory Syncytial Virus Vaccine ,Recomb Aduvanted(Arexvy ) 12/26/2021   Tdap 04/26/2019, 06/19/2022   Zoster Recombinant(Shingrix) 09/07/2019, 02/17/2020    Family History  Problem Relation Age of Onset   Other Mother        COVID   Colon cancer Father    Atrial fibrillation Brother    Heart attack Maternal Grandfather    Parkinson'[name] disease Paternal Grandfather    Esophageal cancer Neg Hx    Stomach cancer Neg Hx    Rectal cancer Neg Hx      Current Outpatient Medications:    albuterol  (PROVENTIL ) (2.5 MG/3ML) 0.083% nebulizer solution, Take 3 mLs (2.5 mg total) by nebulization as directed., Disp: 180 mL, Rfl: 1   amphetamine -dextroamphetamine  (ADDERALL) 20 MG tablet, Take 20 mg by mouth 2 (two) times daily., Disp: , Rfl:    azithromycin  (ZITHROMAX ) 250 MG tablet, Take 1 tablet (250 mg total) by mouth daily., Disp: 30 tablet, Rfl: 5   baclofen  (LIORESAL ) 10 MG tablet, Take 10 mg by mouth in the morning., Disp: , Rfl:    bisacodyl  (DULCOLAX) 5 MG EC tablet, Take 5 mg by mouth See admin instructions. Take 5 mg by mouth in the morning and evening, Disp: , Rfl:    budesonide  (PULMICORT ) 0.5 MG/2ML nebulizer solution, Take 2 mLs (0.5 mg total) by nebulization in the morning and at bedtime. Brush teeth after treatment., Disp: 360 mL, Rfl: 1   calcium  carbonate (OS-CAL - DOSED IN MG OF ELEMENTAL CALCIUM ) 1250 (500 Ca) MG tablet, Take 1 tablet (1,250 mg total) by mouth daily with breakfast., Disp: , Rfl:    Coenzyme Q10 (VITALINE COQ10) 300 MG WAFR, Take 300 mg by mouth in the morning., Disp:  , Rfl:    dexlansoprazole  (DEXILANT ) 60 MG capsule, Take 1 capsule (60 mg total) by mouth daily with breakfast., Disp: 90 capsule, Rfl: 0   famotidine  (PEPCID ) 20 MG tablet, One after supper (Patient taking differently: Take 20 mg by mouth every evening.), Disp: 30 tablet, Rfl: 11   Ferric Maltol  (ACCRUFER ) 30 MG CAPS, Take 1 capsule (30 mg total) by mouth in the morning and at bedtime., Disp: 180 capsule, Rfl: 1   fluconazole  (DIFLUCAN ) 200 MG tablet, Take 200 mg by mouth once a week., Disp: , Rfl:    FLUoxetine  (PROZAC ) 40 MG capsule, Take 1 capsule (40 mg total) by mouth daily., Disp: 90 capsule, Rfl: 0   formoterol  (PERFOROMIST ) 20 MCG/2ML nebulizer solution, Take 2 mLs (20 mcg total) by nebulization at bedtime. Brush teeth after treatment., Disp: 360 mL, Rfl: 1   guaiFENesin  (MUCINEX ) 600 MG 12 hr tablet, Take 1 tablet (600 mg total) by mouth 2 (two) times daily. (Patient taking differently: Take 600 mg by mouth See admin instructions. Take 600 mg by mouth in the morning and evening), Disp: 30 tablet, Rfl: 0   levothyroxine  (SYNTHROID ) 100 MCG tablet, TAKE 1 TABLET BY MOUTH ONCE DAILY BEFORE BREAKFAST, Disp: 90 tablet, Rfl: 3   MAGNESIUM  PO, Take 1 tablet by mouth every evening., Disp: , Rfl:    methenamine  (HIPREX ) 1 g tablet, Take 1 tablet (1 g total) by mouth 2 (two) times daily with a meal., Disp: 180 tablet, Rfl: 0   mirabegron  ER (MYRBETRIQ ) 50 MG TB24 tablet, Take 50 mg by mouth in the morning., Disp: ,  Rfl:    mirtazapine  (REMERON ) 15 MG tablet, TAKE ONE TABLET BY MOUTH AT BEDTIME, Disp: 90 tablet, Rfl: 1   NON FORMULARY, Take 1 tablet by mouth See admin instructions. [employer] Mature Multivitamin with Calcium - Take 1 tablet by mouth with breakfast in the morning, Disp: , Rfl:    NON FORMULARY, Take 1 capsule by mouth See admin instructions. Tru[employer] probiotic advanced  digestive probiotic- Take 1 capsule by mouth in the morning, Disp: , Rfl:    OXYGEN , Inhale 2-3.5 L/min into the  lungs continuous., Disp: , Rfl:    PRESCRIPTION MEDICATION, Apply 1 Device topically See admin instructions. Afflovest- For 30 minutes in the morning and evening- to begin @ 60% and work up to 80%, Disp: , Rfl:    Respiratory Therapy Supplies (FLUTTER) DEVI, Use as directed (Patient taking differently: Inhale 1 Device into the lungs as directed.), Disp: 1 each, Rfl: 0   Respiratory Therapy Supplies (NEBULIZER MASK ADULT) MISC, 1 Device by Does not apply route as directed., Disp: 1 each, Rfl: 2   revefenacin  (YUPELRI ) 175 MCG/3ML nebulizer solution, Take 3 mLs (175 mcg total) by nebulization every morning. Brush teeth after treatment., Disp: 270 mL, Rfl: 1   sodium chloride  HYPERTONIC 3 % nebulizer solution, Take by nebulization 2 (two) times daily as needed for cough. Dx J44.9, Disp: 750 mL, Rfl: 1   tamsulosin  (FLOMAX ) 0.4 MG CAPS capsule, Take 0.4 mg by mouth in the morning., Disp: , Rfl:    temazepam  (RESTORIL ) 15 MG capsule, Take 1 capsule (15 mg total) by mouth every evening., Disp: 90 capsule, Rfl: 0   torsemide  (DEMADEX ) 10 MG tablet, Take 1 tablet (10 mg total) by mouth daily., Disp: 90 tablet, Rfl: 1      Objective:   Vitals:   09/30/23 1425 09/30/23 1426  BP: 106/64   Pulse: 78   SpO2: 97%   Weight:  126 lb (57.2 kg)  Height:  5' 3 (1.6 m)    Estimated body mass index is 22.32 kg/m as calculated from the following:   Height as of this encounter: 5' 3 (1.6 m).   Weight as of this encounter: 126 lb (57.2 kg).  @WEIGHTCHANGE @  [employer]   09/30/23 1426  Weight: 126 lb (57.2 kg)     Physical Exam   General: No distress. Looks better O2 at rest: no Cane present: no Sitting in wheel chair: no Frail: looks stronger. HAS WALKER Obese: n Neuro: Alert and Oriented x 3. GCS 15. Speech normal Psych: Pleasant Resp:  Barrel Chest - no.  Wheeze - no, Crackles - n, No overt respiratory distress CVS: Normal heart sounds. Murmurs - no Ext: Stigmata of Connective Tissue  Disease - no HEENT: Normal upper airway. PEERL +. No post nasal drip. HOARSE VOICe        Assessment:       ICD-10-CM   1. Chronic respiratory failure with hypoxia (HCC)  J96.11     2. Bilateral vocal cord paralysis  J38.02     3. Multiple sclerosis (HCC)  G35     4. History of Guillain-Barre syndrome  Z86.69          Plan:     Patient Instructions     ICD-10-CM   1. Chronic respiratory failure with hypoxia (HCC)  J96.11     2. Bilateral vocal cord paralysis  J38.02     3. Multiple sclerosis (HCC)  G35     4. History of Guillain-Barre syndrome  Z86.69  Clinically doing well and doing ADLS including driving Respect your decision against tracheostomy  Plan - cotinue your nebulizers and oher medicines including weekly IvIG - Continue night oxygen   Follow-up - 6 months or sooner if needed; 15 min visit - with app   FOLLOWUP Return in about 6 months (around 04/01/2024) for 15 min visit, with any of the APPS, Face to Face Visit.    SIGNATURE    Dr. Dorethia Cave, M.D., F.C.C.P,  Pulmonary and Critical Care Medicine Staff Physician, [facility] Center Director - Interstitial Lung Disease  Program  Pulmonary Fibrosis [facility] at [facility] [place], [place], [address]  Pager: [phone], If no answer or between  15:00h - 7:00h: call [phone] Telephone: [phone]  2:59 PM 09/30/2023

## 2023-10-02 DIAGNOSIS — Z96641 Presence of right artificial hip joint: Secondary | ICD-10-CM | POA: Diagnosis not present

## 2023-10-02 DIAGNOSIS — M25551 Pain in right hip: Secondary | ICD-10-CM | POA: Diagnosis not present

## 2023-10-07 DIAGNOSIS — R260 Ataxic gait: Secondary | ICD-10-CM | POA: Diagnosis not present

## 2023-10-07 DIAGNOSIS — R296 Repeated falls: Secondary | ICD-10-CM | POA: Diagnosis not present

## 2023-10-07 DIAGNOSIS — S42211D Unspecified displaced fracture of surgical neck of right humerus, subsequent encounter for fracture with routine healing: Secondary | ICD-10-CM | POA: Diagnosis not present

## 2023-10-07 DIAGNOSIS — G35 Multiple sclerosis: Secondary | ICD-10-CM | POA: Diagnosis not present

## 2023-10-08 ENCOUNTER — Ambulatory Visit: Admitting: Internal Medicine

## 2023-10-08 ENCOUNTER — Encounter: Payer: Self-pay | Admitting: Internal Medicine

## 2023-10-08 VITALS — BP 118/76 | HR 79 | Temp 98.2°F | Resp 16 | Ht 63.0 in | Wt 126.0 lb

## 2023-10-08 DIAGNOSIS — E038 Other specified hypothyroidism: Secondary | ICD-10-CM | POA: Diagnosis not present

## 2023-10-08 DIAGNOSIS — E441 Mild protein-calorie malnutrition: Secondary | ICD-10-CM | POA: Diagnosis not present

## 2023-10-08 DIAGNOSIS — E871 Hypo-osmolality and hyponatremia: Secondary | ICD-10-CM | POA: Diagnosis not present

## 2023-10-08 DIAGNOSIS — G35 Multiple sclerosis: Secondary | ICD-10-CM | POA: Diagnosis not present

## 2023-10-08 DIAGNOSIS — D508 Other iron deficiency anemias: Secondary | ICD-10-CM | POA: Diagnosis not present

## 2023-10-08 DIAGNOSIS — R64 Cachexia: Secondary | ICD-10-CM

## 2023-10-08 DIAGNOSIS — J449 Chronic obstructive pulmonary disease, unspecified: Secondary | ICD-10-CM | POA: Diagnosis not present

## 2023-10-08 DIAGNOSIS — J9611 Chronic respiratory failure with hypoxia: Secondary | ICD-10-CM

## 2023-10-08 LAB — IBC + FERRITIN
Ferritin: 105.8 ng/mL (ref 10.0–291.0)
Iron: 119 ug/dL (ref 42–145)
Saturation Ratios: 39.4 % (ref 20.0–50.0)
TIBC: 302.4 ug/dL (ref 250.0–450.0)
Transferrin: 216 mg/dL (ref 212.0–360.0)

## 2023-10-08 LAB — HEPATIC FUNCTION PANEL
ALT: 15 U/L (ref 0–35)
AST: 29 U/L (ref 0–37)
Albumin: 4.1 g/dL (ref 3.5–5.2)
Alkaline Phosphatase: 91 U/L (ref 39–117)
Bilirubin, Direct: 0.1 mg/dL (ref 0.0–0.3)
Total Bilirubin: 0.3 mg/dL (ref 0.2–1.2)
Total Protein: 7.5 g/dL (ref 6.0–8.3)

## 2023-10-08 LAB — CBC WITH DIFFERENTIAL/PLATELET
Basophils Absolute: 0.1 K/uL (ref 0.0–0.1)
Basophils Relative: 0.9 % (ref 0.0–3.0)
Eosinophils Absolute: 0.2 K/uL (ref 0.0–0.7)
Eosinophils Relative: 2.9 % (ref 0.0–5.0)
HCT: 40 % (ref 36.0–46.0)
Hemoglobin: 13.3 g/dL (ref 12.0–15.0)
Lymphocytes Relative: 32.9 % (ref 12.0–46.0)
Lymphs Abs: 2.4 K/uL (ref 0.7–4.0)
MCHC: 33.3 g/dL (ref 30.0–36.0)
MCV: 90.4 fl (ref 78.0–100.0)
Monocytes Absolute: 0.7 K/uL (ref 0.1–1.0)
Monocytes Relative: 9.5 % (ref 3.0–12.0)
Neutro Abs: 3.9 K/uL (ref 1.4–7.7)
Neutrophils Relative %: 53.8 % (ref 43.0–77.0)
Platelets: 186 K/uL (ref 150.0–400.0)
RBC: 4.42 Mil/uL (ref 3.87–5.11)
RDW: 14.3 % (ref 11.5–15.5)
WBC: 7.2 K/uL (ref 4.0–10.5)

## 2023-10-08 LAB — TSH: TSH: 5.58 u[IU]/mL — ABNORMAL HIGH (ref 0.35–5.50)

## 2023-10-08 LAB — BASIC METABOLIC PANEL WITH GFR
BUN: 31 mg/dL — ABNORMAL HIGH (ref 6–23)
CO2: 30 meq/L (ref 19–32)
Calcium: 9.5 mg/dL (ref 8.4–10.5)
Chloride: 96 meq/L (ref 96–112)
Creatinine, Ser: 0.94 mg/dL (ref 0.40–1.20)
GFR: 61.18 mL/min (ref >60.00)
Glucose, Bld: 84 mg/dL (ref 70–99)
Potassium: 4.1 meq/L (ref 3.5–5.1)
Sodium: 133 meq/L — ABNORMAL LOW (ref 135–145)

## 2023-10-08 NOTE — Patient Instructions (Signed)
 Hypothyroidism  Hypothyroidism is when the thyroid gland does not make enough of certain hormones. This is called an underactive thyroid. The thyroid gland is a small gland located in the lower front part of the neck, just in front of the windpipe (trachea). This gland makes hormones that help control how the body uses food for energy (metabolism) as well as how the heart and brain function. These hormones also play a role in keeping your bones strong. When the thyroid is underactive, it produces too little of the hormones thyroxine (T4) and triiodothyronine (T3). What are the causes? This condition may be caused by: Hashimoto's disease. This is a disease in which the body's disease-fighting system (immune system) attacks the thyroid gland. This is the most common cause. Viral infections. Pregnancy. Certain medicines. Birth defects. Problems with a gland in the center of the brain (pituitary gland). Lack of enough iodine in the diet. Other causes may include: Past radiation treatments to the head or neck for cancer. Past treatment with radioactive iodine. Past exposure to radiation in the environment. Past surgical removal of part or all of the thyroid. What increases the risk? You are more likely to develop this condition if: You are female. You have a family history of thyroid conditions. You use a medicine called lithium. You take medicines that affect the immune system (immunosuppressants). What are the signs or symptoms? Common symptoms of this condition include: Not being able to tolerate cold. Feeling as though you have no energy (lethargy). Lack of appetite. Constipation. Sadness or depression. Weight gain that is not explained by a change in diet or exercise habits. Menstrual irregularity. Dry skin, coarse hair, or brittle nails. Other symptoms may include: Muscle pain. Slowing of thought processes. Poor memory. How is this diagnosed? This condition may be diagnosed  based on: Your symptoms, your medical history, and a physical exam. Blood tests. You may also have imaging tests, such as an ultrasound or MRI. How is this treated? This condition is treated with medicine that replaces the thyroid hormones that your body does not make. After you begin treatment, it may take several weeks for symptoms to go away. Follow these instructions at home: Take over-the-counter and prescription medicines only as told by your health care provider. If you start taking any new medicines, tell your health care provider. Keep all follow-up visits as told by your health care provider. This is important. As your condition improves, your dosage of thyroid hormone medicine may change. You will need to have blood tests regularly so that your health care provider can monitor your condition. Contact a health care provider if: Your symptoms do not get better with treatment. You are taking thyroid hormone replacement medicine and you: Sweat a lot. Have tremors. Feel anxious. Lose weight rapidly. Cannot tolerate heat. Have emotional swings. Have diarrhea. Feel weak. Get help right away if: You have chest pain. You have an irregular heartbeat. You have a rapid heartbeat. You have difficulty breathing. These symptoms may be an emergency. Get help right away. Call 911. Do not wait to see if the symptoms will go away. Do not drive yourself to the hospital. Summary Hypothyroidism is when the thyroid gland does not make enough of certain hormones (it is underactive). When the thyroid is underactive, it produces too little of the hormones thyroxine (T4) and triiodothyronine (T3). The most common cause is Hashimoto's disease, a disease in which the body's disease-fighting system (immune system) attacks the thyroid gland. The condition can also be caused  by viral infections, medicine, pregnancy, or past radiation treatment to the head or neck. Symptoms may include weight gain, dry  skin, constipation, feeling as though you do not have energy, and not being able to tolerate cold. This condition is treated with medicine to replace the thyroid hormones that your body does not make. This information is not intended to replace advice given to you by your health care provider. Make sure you discuss any questions you have with your health care provider. Document Revised: 03/12/2021 Document Reviewed: 03/12/2021 Elsevier Patient Education  2024 [employer].

## 2023-10-08 NOTE — Progress Notes (Signed)
 Subjective:  Patient ID: Jestine VEAR Lesches, female    DOB: [birth year]  Age: 73 y.o. MRN: [id number]  CC: Hypothyroidism and COPD   HPI HILDEGARD HLAVAC presents for f/up -----  Discussed the use of AI scribe software for clinical note transcription with the patient, who gave verbal consent to proceed.  History of Present Illness   JACQUELINE SPOFFORD is a 73 year old female who presents for a follow-up regarding her lung condition and weight management.  She has gained more than twenty pounds and feels this is beyond her previous weight. She is satisfied with her current weight and wants to discontinue the appetite-increasing medication she was prescribed.  Regarding her lung condition, she states that her lungs are doing well and she only uses oxygen  at night. She is hesitant to have the oxygen  removed due to fear of needing it again. No recent coughing, wheezing, or shortness of breath, noting only her usual cough related to her vocal cords, which produces mucus.  She is actively engaged in physical activities, attending physical therapy twice a week, going to the gym twice a week, and participating in water  aerobics twice a week. She feels significantly better than she did a year ago.  She reports an incident two weeks ago where she injured her left lower leg due to weakness when getting out of the pool. She feels the injury is healing well. No weakness, dizziness, or lightheadedness, except for the incident when she was getting out of the pool.       Outpatient Medications Prior to Visit  Medication Sig Dispense Refill   albuterol  (PROVENTIL ) (2.5 MG/3ML) 0.083% nebulizer solution Take 3 mLs (2.5 mg total) by nebulization as directed. 180 mL 1   amphetamine -dextroamphetamine  (ADDERALL) 20 MG tablet Take 20 mg by mouth 2 (two) times daily.     azithromycin  (ZITHROMAX ) 250 MG tablet Take 1 tablet (250 mg total) by mouth daily. 30 tablet 5   baclofen  (LIORESAL ) 10 MG tablet Take 10 mg by mouth  in the morning.     bisacodyl  (DULCOLAX) 5 MG EC tablet Take 5 mg by mouth See admin instructions. Take 5 mg by mouth in the morning and evening     budesonide  (PULMICORT ) 0.5 MG/2ML nebulizer solution Take 2 mLs (0.5 mg total) by nebulization in the morning and at bedtime. Brush teeth after treatment. 360 mL 1   calcium  carbonate (OS-CAL - DOSED IN MG OF ELEMENTAL CALCIUM ) 1250 (500 Ca) MG tablet Take 1 tablet (1,250 mg total) by mouth daily with breakfast.     Coenzyme Q10 (VITALINE COQ10) 300 MG WAFR Take 300 mg by mouth in the morning.     dexlansoprazole  (DEXILANT ) 60 MG capsule Take 1 capsule (60 mg total) by mouth daily with breakfast. 90 capsule 0   famotidine  (PEPCID ) 20 MG tablet One after supper (Patient taking differently: Take 20 mg by mouth every evening.) 30 tablet 11   Ferric Maltol  (ACCRUFER ) 30 MG CAPS Take 1 capsule (30 mg total) by mouth in the morning and at bedtime. 180 capsule 1   fluconazole  (DIFLUCAN ) 200 MG tablet Take 200 mg by mouth once a week.     FLUoxetine  (PROZAC ) 40 MG capsule Take 1 capsule (40 mg total) by mouth daily. 90 capsule 0   formoterol  (PERFOROMIST ) 20 MCG/2ML nebulizer solution Take 2 mLs (20 mcg total) by nebulization at bedtime. Brush teeth after treatment. 360 mL 1   guaiFENesin  (MUCINEX ) 600 MG 12 hr tablet Take 1  tablet (600 mg total) by mouth 2 (two) times daily. (Patient taking differently: Take 600 mg by mouth See admin instructions. Take 600 mg by mouth in the morning and evening) 30 tablet 0   levothyroxine  (SYNTHROID ) 100 MCG tablet TAKE 1 TABLET BY MOUTH ONCE DAILY BEFORE BREAKFAST 90 tablet 3   MAGNESIUM  PO Take 1 tablet by mouth every evening.     methenamine  (HIPREX ) 1 g tablet Take 1 tablet (1 g total) by mouth 2 (two) times daily with a meal. 180 tablet 0   mirabegron  ER (MYRBETRIQ ) 50 MG TB24 tablet Take 50 mg by mouth in the morning.     mirtazapine  (REMERON ) 15 MG tablet TAKE ONE TABLET BY MOUTH AT BEDTIME 90 tablet 1   NON FORMULARY  Take 1 tablet by mouth See admin instructions. [employer] Mature Multivitamin with Calcium - Take 1 tablet by mouth with breakfast in the morning     NON FORMULARY Take 1 capsule by mouth See admin instructions. [employer] probiotic advanced  digestive probiotic- Take 1 capsule by mouth in the morning     OXYGEN  Inhale 2-3.5 L/min into the lungs continuous.     PRESCRIPTION MEDICATION Apply 1 Device topically See admin instructions. Afflovest- For 30 minutes in the morning and evening- to begin @ 60% and work up to 80%     Respiratory Therapy Supplies (FLUTTER) DEVI Use as directed (Patient taking differently: Inhale 1 Device into the lungs as directed.) 1 each 0   Respiratory Therapy Supplies (NEBULIZER MASK ADULT) MISC 1 Device by Does not apply route as directed. 1 each 2   revefenacin  (YUPELRI ) 175 MCG/3ML nebulizer solution Take 3 mLs (175 mcg total) by nebulization every morning. Brush teeth after treatment. 270 mL 1   sodium chloride  HYPERTONIC 3 % nebulizer solution Take by nebulization 2 (two) times daily as needed for cough. Dx J44.9 750 mL 1   tamsulosin  (FLOMAX ) 0.4 MG CAPS capsule Take 0.4 mg by mouth in the morning.     temazepam  (RESTORIL ) 15 MG capsule Take 1 capsule (15 mg total) by mouth every evening. 90 capsule 0   torsemide  (DEMADEX ) 10 MG tablet Take 1 tablet (10 mg total) by mouth daily. 90 tablet 1   No facility-administered medications prior to visit.    ROS Review of Systems  Constitutional:  Positive for fatigue.  HENT:  Negative for sore throat.   Respiratory:  Positive for cough and shortness of breath. Negative for chest tightness and wheezing.   Cardiovascular:  Negative for chest pain, palpitations and leg swelling.  Gastrointestinal:  Negative for abdominal pain, constipation, diarrhea, nausea and vomiting.  Endocrine: Negative.  Negative for cold intolerance and heat intolerance.  Genitourinary: Negative.  Negative for difficulty urinating.   Musculoskeletal:  Positive for gait problem. Negative for myalgias.  Skin:  Positive for wound.  Neurological:  Negative for dizziness.  Hematological:  Negative for adenopathy. Does not bruise/bleed easily.  Psychiatric/Behavioral: Negative.      Objective:  BP 118/76 (BP Location: Left Arm, Patient Position: Sitting, Cuff Size: Normal)   Pulse 79   Temp 98.2 F (36.8 C) (Oral)   Resp 16   Ht 5' 3 (1.6 m)   Wt 126 lb (57.2 kg)   SpO2 97%   BMI 22.32 kg/m   BP Readings from Last 3 Encounters:  10/08/23 118/76  09/30/23 106/64  08/04/23 104/62    Wt Readings from Last 3 Encounters:  10/08/23 126 lb (57.2 kg)  09/30/23 126 lb (57.2 kg)  08/04/23 122 lb (55.3 kg)    Physical Exam Vitals reviewed.  Constitutional:      General: She is not in acute distress.    Appearance: Normal appearance. She is ill-appearing. She is not toxic-appearing or diaphoretic.  HENT:     Mouth/Throat:     Mouth: Mucous membranes are moist.  Eyes:     General: No scleral icterus.    Conjunctiva/sclera: Conjunctivae normal.  Cardiovascular:     Rate and Rhythm: Normal rate and regular rhythm.     Heart sounds: No murmur heard.    No friction rub. No gallop.  Pulmonary:     Effort: Pulmonary effort is normal.     Breath sounds: No stridor. No wheezing, rhonchi or rales.  Abdominal:     General: Abdomen is flat.     Palpations: There is no mass.     Tenderness: There is no abdominal tenderness. There is no guarding.     Hernia: No hernia is present.  Musculoskeletal:        General: Normal range of motion.     Cervical back: Neck supple.     Right lower leg: No edema.     Left lower leg: No edema.  Lymphadenopathy:     Cervical: No cervical adenopathy.  Skin:    General: Skin is warm and dry.  Neurological:     Mental Status: She is alert. Mental status is at baseline.     Motor: Weakness present.     Coordination: Coordination abnormal.     Gait: Gait abnormal.     Deep  Tendon Reflexes: Reflexes abnormal.  Psychiatric:        Mood and Affect: Mood normal.        Behavior: Behavior normal.     Lab Results  Component Value Date   WBC 7.2 10/08/2023   HGB 13.3 10/08/2023   HCT 40.0 10/08/2023   PLT 186.0 10/08/2023   GLUCOSE 84 10/08/2023   CHOL 198 06/08/2023   TRIG 72.0 06/08/2023   HDL 74.40 06/08/2023   LDLDIRECT 116.1 06/25/2010   LDLCALC 109 (H) 06/08/2023   ALT 15 10/08/2023   AST 29 10/08/2023   NA 133 (L) 10/08/2023   K 4.1 10/08/2023   CL 96 10/08/2023   CREATININE 0.94 10/08/2023   BUN 31 (H) 10/08/2023   CO2 30 10/08/2023   TSH 5.58 (H) 10/08/2023   INR 1.2 10/18/2022   HGBA1C 5.6 02/25/2013    CT CHEST HIGH RESOLUTION Result Date: 03/04/2023 CLINICAL DATA:  73 year old female with history of persistent chronic cough. Possible pneumonia. EXAM: CT CHEST WITHOUT CONTRAST TECHNIQUE: Multidetector CT imaging of the chest was performed following the standard protocol without intravenous contrast. High resolution imaging of the lungs, as well as inspiratory and expiratory imaging, was performed. RADIATION DOSE REDUCTION: This exam was performed according to the departmental dose-optimization program which includes automated exposure control, adjustment of the mA and/or kV according to patient size and/or use of iterative reconstruction technique. COMPARISON:  Chest CT 08/31/2022. FINDINGS: Cardiovascular: Heart size is normal. There is no significant pericardial fluid, thickening or pericardial calcification. There is aortic atherosclerosis, as well as atherosclerosis of the great vessels of the mediastinum and the coronary arteries, including calcified atherosclerotic plaque in the right coronary artery. Mediastinum/Nodes: Prominent AP window lymph node measuring up to 1.1 cm in short axis, similar to prior studies, presumably benign and reactive. No other definite pathologically enlarged mediastinal or hilar lymph nodes are noted. Esophagus is  unremarkable in appearance. No axillary lymphadenopathy. Lungs/Pleura: High-resolution imaging demonstrates some patchy areas of ground-glass attenuation, septal thickening, thickening of the peribronchovascular interstitium and scattered areas of cylindrical bronchiectasis. No honeycombing noted. Bronchiectasis is most severe in the left lower lobe where there is extensive mucoid impaction and extensive peribronchovascular architectural distortion, likely to reflect areas of chronic post infectious or inflammatory scarring. Inspiratory and expiratory imaging demonstrates some mild air trapping indicative of mild small airways disease. Small amount of consolidation is noted in the left lower lobe suggesting active pneumonia. No pleural effusions. Upper Abdomen: Unremarkable. Musculoskeletal: There are no aggressive appearing lytic or blastic lesions noted in the visualized portions of the skeleton. IMPRESSION: 1. There is a spectrum of findings in the lungs considered most compatible with an alternative diagnosis (not usual interstitial pneumonia) per current ATS guidelines. 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 2. Airspace consolidation in the left lower lobe in the midst of an area of extensive bronchiectasis, indicative of pneumonia. 3. Mild air trapping indicative of mild small airways disease. 4. Aortic atherosclerosis, in addition to right coronary artery disease. Please note that although the presence of coronary artery calcium  documents the presence of coronary artery disease, the severity of this disease and any potential stenosis cannot be assessed on this non-gated CT examination. Assessment for potential risk factor modification, dietary therapy or pharmacologic therapy may be warranted, if clinically indicated. Aortic Atherosclerosis (ICD10-I70.0). Electronically Signed   By: Toribio Aye M.D.   On: 03/04/2023 06:48    Assessment & Plan:  Mild protein  malnutrition (HCC) -     IBC + Ferritin; Future -     CBC with Differential/Platelet; Future -     Hepatic function panel; Future  Chronic respiratory failure with hypoxia (HCC)- Stable.  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  Iron deficiency anemia secondary to inadequate dietary iron intake -     IBC + Ferritin; Future -     CBC with Differential/Platelet; Future  Hypothyroidism- She is euthyroid. -     TSH; Future -     Hepatic function panel; Future  Hyponatremia -     Basic metabolic panel with GFR; Future -     Hepatic function panel; Future  Multiple sclerosis (HCC)- Stable.          Follow-up: Return in about 6 months (around 04/09/2024).  Debby Molt, MD

## 2023-10-09 ENCOUNTER — Ambulatory Visit: Payer: Self-pay | Admitting: Internal Medicine

## 2023-10-12 DIAGNOSIS — R260 Ataxic gait: Secondary | ICD-10-CM | POA: Diagnosis not present

## 2023-10-12 DIAGNOSIS — G35 Multiple sclerosis: Secondary | ICD-10-CM | POA: Diagnosis not present

## 2023-10-12 DIAGNOSIS — S42211D Unspecified displaced fracture of surgical neck of right humerus, subsequent encounter for fracture with routine healing: Secondary | ICD-10-CM | POA: Diagnosis not present

## 2023-10-12 DIAGNOSIS — R296 Repeated falls: Secondary | ICD-10-CM | POA: Diagnosis not present

## 2023-10-14 DIAGNOSIS — G35 Multiple sclerosis: Secondary | ICD-10-CM | POA: Diagnosis not present

## 2023-10-14 DIAGNOSIS — R296 Repeated falls: Secondary | ICD-10-CM | POA: Diagnosis not present

## 2023-10-14 DIAGNOSIS — R260 Ataxic gait: Secondary | ICD-10-CM | POA: Diagnosis not present

## 2023-10-14 DIAGNOSIS — S42211D Unspecified displaced fracture of surgical neck of right humerus, subsequent encounter for fracture with routine healing: Secondary | ICD-10-CM | POA: Diagnosis not present

## 2023-10-26 DIAGNOSIS — G35 Multiple sclerosis: Secondary | ICD-10-CM | POA: Diagnosis not present

## 2023-10-26 DIAGNOSIS — R296 Repeated falls: Secondary | ICD-10-CM | POA: Diagnosis not present

## 2023-10-26 DIAGNOSIS — S42211D Unspecified displaced fracture of surgical neck of right humerus, subsequent encounter for fracture with routine healing: Secondary | ICD-10-CM | POA: Diagnosis not present

## 2023-10-26 DIAGNOSIS — R260 Ataxic gait: Secondary | ICD-10-CM | POA: Diagnosis not present

## 2023-10-28 ENCOUNTER — Encounter: Payer: Self-pay | Admitting: Internal Medicine

## 2023-10-28 DIAGNOSIS — R260 Ataxic gait: Secondary | ICD-10-CM | POA: Diagnosis not present

## 2023-10-28 DIAGNOSIS — S42211D Unspecified displaced fracture of surgical neck of right humerus, subsequent encounter for fracture with routine healing: Secondary | ICD-10-CM | POA: Diagnosis not present

## 2023-10-28 DIAGNOSIS — G35 Multiple sclerosis: Secondary | ICD-10-CM | POA: Diagnosis not present

## 2023-10-28 DIAGNOSIS — R296 Repeated falls: Secondary | ICD-10-CM | POA: Diagnosis not present

## 2023-10-29 ENCOUNTER — Other Ambulatory Visit: Payer: Self-pay | Admitting: Internal Medicine

## 2023-10-29 DIAGNOSIS — F5104 Psychophysiologic insomnia: Secondary | ICD-10-CM

## 2023-10-30 ENCOUNTER — Other Ambulatory Visit: Payer: Self-pay

## 2023-10-30 ENCOUNTER — Other Ambulatory Visit: Payer: Self-pay | Admitting: Internal Medicine

## 2023-10-30 DIAGNOSIS — F5104 Psychophysiologic insomnia: Secondary | ICD-10-CM

## 2023-10-31 MED ORDER — TEMAZEPAM 15 MG PO CAPS: 15.0000 mg | ORAL_CAPSULE | Freq: Every evening | ORAL | 0 refills | Status: DC

## 2023-10-31 MED ORDER — BACLOFEN 10 MG PO TABS: 10.0000 mg | ORAL_TABLET | Freq: Every morning | ORAL | 5 refills | Status: AC

## 2023-11-02 DIAGNOSIS — G35 Multiple sclerosis: Secondary | ICD-10-CM | POA: Diagnosis not present

## 2023-11-02 DIAGNOSIS — R296 Repeated falls: Secondary | ICD-10-CM | POA: Diagnosis not present

## 2023-11-02 DIAGNOSIS — S42211D Unspecified displaced fracture of surgical neck of right humerus, subsequent encounter for fracture with routine healing: Secondary | ICD-10-CM | POA: Diagnosis not present

## 2023-11-02 DIAGNOSIS — R260 Ataxic gait: Secondary | ICD-10-CM | POA: Diagnosis not present

## 2023-11-04 DIAGNOSIS — R260 Ataxic gait: Secondary | ICD-10-CM | POA: Diagnosis not present

## 2023-11-04 DIAGNOSIS — R296 Repeated falls: Secondary | ICD-10-CM | POA: Diagnosis not present

## 2023-11-04 DIAGNOSIS — S42211D Unspecified displaced fracture of surgical neck of right humerus, subsequent encounter for fracture with routine healing: Secondary | ICD-10-CM | POA: Diagnosis not present

## 2023-11-04 DIAGNOSIS — G35 Multiple sclerosis: Secondary | ICD-10-CM | POA: Diagnosis not present

## 2023-11-09 DIAGNOSIS — S42211D Unspecified displaced fracture of surgical neck of right humerus, subsequent encounter for fracture with routine healing: Secondary | ICD-10-CM | POA: Diagnosis not present

## 2023-11-09 DIAGNOSIS — R296 Repeated falls: Secondary | ICD-10-CM | POA: Diagnosis not present

## 2023-11-09 DIAGNOSIS — R260 Ataxic gait: Secondary | ICD-10-CM | POA: Diagnosis not present

## 2023-11-09 DIAGNOSIS — G35 Multiple sclerosis: Secondary | ICD-10-CM | POA: Diagnosis not present

## 2023-11-17 ENCOUNTER — Other Ambulatory Visit: Payer: Self-pay | Admitting: Internal Medicine

## 2023-11-17 DIAGNOSIS — K2100 Gastro-esophageal reflux disease with esophagitis, without bleeding: Secondary | ICD-10-CM

## 2023-11-17 DIAGNOSIS — R1314 Dysphagia, pharyngoesophageal phase: Secondary | ICD-10-CM

## 2023-11-17 DIAGNOSIS — F3341 Major depressive disorder, recurrent, in partial remission: Secondary | ICD-10-CM

## 2023-12-10 DIAGNOSIS — J449 Chronic obstructive pulmonary disease, unspecified: Secondary | ICD-10-CM | POA: Diagnosis not present

## 2023-12-10 DIAGNOSIS — D849 Immunodeficiency, unspecified: Secondary | ICD-10-CM | POA: Diagnosis not present

## 2023-12-10 DIAGNOSIS — Z7409 Other reduced mobility: Secondary | ICD-10-CM | POA: Diagnosis not present

## 2023-12-10 DIAGNOSIS — G35 Multiple sclerosis: Secondary | ICD-10-CM | POA: Diagnosis not present

## 2023-12-13 ENCOUNTER — Other Ambulatory Visit: Payer: Self-pay | Admitting: Internal Medicine

## 2023-12-21 ENCOUNTER — Other Ambulatory Visit (HOSPITAL_BASED_OUTPATIENT_CLINIC_OR_DEPARTMENT_OTHER): Payer: Self-pay

## 2023-12-21 MED ORDER — FLUZONE HIGH-DOSE 0.5 ML IM SUSY
0.5000 mL | PREFILLED_SYRINGE | Freq: Once | INTRAMUSCULAR | 0 refills | Status: AC
  Filled 2023-12-21 – 2023-12-23 (×2): qty 0.5, 1d supply, fill #0

## 2023-12-22 DIAGNOSIS — M1812 Unilateral primary osteoarthritis of first carpometacarpal joint, left hand: Secondary | ICD-10-CM | POA: Diagnosis not present

## 2023-12-22 DIAGNOSIS — M19049 Primary osteoarthritis, unspecified hand: Secondary | ICD-10-CM | POA: Insufficient documentation

## 2023-12-23 ENCOUNTER — Other Ambulatory Visit (HOSPITAL_BASED_OUTPATIENT_CLINIC_OR_DEPARTMENT_OTHER): Payer: Self-pay

## 2023-12-23 DIAGNOSIS — Z23 Encounter for immunization: Secondary | ICD-10-CM | POA: Diagnosis not present

## 2023-12-28 ENCOUNTER — Other Ambulatory Visit (HOSPITAL_BASED_OUTPATIENT_CLINIC_OR_DEPARTMENT_OTHER): Payer: Self-pay

## 2023-12-28 DIAGNOSIS — Z23 Encounter for immunization: Secondary | ICD-10-CM | POA: Diagnosis not present

## 2023-12-28 MED ORDER — COMIRNATY 30 MCG/0.3ML IM SUSY
0.3000 mL | PREFILLED_SYRINGE | Freq: Once | INTRAMUSCULAR | 0 refills | Status: AC
  Filled 2023-12-28: qty 0.3, 1d supply, fill #0

## 2024-01-23 ENCOUNTER — Other Ambulatory Visit: Payer: Self-pay | Admitting: Internal Medicine

## 2024-01-23 ENCOUNTER — Encounter: Payer: Self-pay | Admitting: Internal Medicine

## 2024-01-23 DIAGNOSIS — F5104 Psychophysiologic insomnia: Secondary | ICD-10-CM

## 2024-01-26 ENCOUNTER — Other Ambulatory Visit: Payer: Self-pay | Admitting: Internal Medicine

## 2024-01-26 ENCOUNTER — Encounter: Payer: Self-pay | Admitting: Allergy and Immunology

## 2024-01-26 ENCOUNTER — Ambulatory Visit (INDEPENDENT_AMBULATORY_CARE_PROVIDER_SITE_OTHER): Admitting: Allergy and Immunology

## 2024-01-26 ENCOUNTER — Other Ambulatory Visit: Payer: Self-pay

## 2024-01-26 VITALS — BP 118/68 | HR 87 | Temp 98.1°F | Resp 18 | Ht 63.39 in | Wt 125.4 lb

## 2024-01-26 DIAGNOSIS — D839 Common variable immunodeficiency, unspecified: Secondary | ICD-10-CM | POA: Diagnosis not present

## 2024-01-26 DIAGNOSIS — K219 Gastro-esophageal reflux disease without esophagitis: Secondary | ICD-10-CM

## 2024-01-26 DIAGNOSIS — J3802 Paralysis of vocal cords and larynx, bilateral: Secondary | ICD-10-CM | POA: Diagnosis not present

## 2024-01-26 DIAGNOSIS — K2100 Gastro-esophageal reflux disease with esophagitis, without bleeding: Secondary | ICD-10-CM | POA: Diagnosis not present

## 2024-01-26 DIAGNOSIS — R1314 Dysphagia, pharyngoesophageal phase: Secondary | ICD-10-CM

## 2024-01-26 DIAGNOSIS — R600 Localized edema: Secondary | ICD-10-CM

## 2024-01-26 DIAGNOSIS — J479 Bronchiectasis, uncomplicated: Secondary | ICD-10-CM | POA: Diagnosis not present

## 2024-01-26 MED ORDER — DEXLANSOPRAZOLE 60 MG PO CPDR: 1.0000 | DELAYED_RELEASE_CAPSULE | Freq: Every day | ORAL | 0 refills | Status: AC

## 2024-01-26 MED ORDER — BUDESONIDE 0.5 MG/2ML IN SUSP: 0.5000 mg | Freq: Two times a day (BID) | RESPIRATORY_TRACT | 1 refills | Status: AC

## 2024-01-26 MED ORDER — ALBUTEROL SULFATE (2.5 MG/3ML) 0.083% IN NEBU: 2.5000 mg | INHALATION_SOLUTION | RESPIRATORY_TRACT | 1 refills | Status: AC

## 2024-01-26 MED ORDER — YUPELRI 175 MCG/3ML IN SOLN: 175.0000 ug | Freq: Every morning | RESPIRATORY_TRACT | 1 refills | Status: AC

## 2024-01-26 MED ORDER — FORMOTEROL FUMARATE 20 MCG/2ML IN NEBU: 20.0000 ug | INHALATION_SOLUTION | Freq: Every evening | RESPIRATORY_TRACT | 1 refills | Status: AC

## 2024-01-26 MED ORDER — FAMOTIDINE 20 MG PO TABS: ORAL_TABLET | ORAL | 11 refills | Status: AC

## 2024-01-26 NOTE — Progress Notes (Unsigned)
 [place] - [place] - [place] - [place] - [place]   Follow-up Note  Referring Provider: Joshua Debby CROME, MD Primary Provider: Joshua Debby CROME, MD Date of Office Visit: 01/26/2024  Subjective:   Kayla [name] (DOB: 10-Oct-1950) is a 73 y.o. female who [facility] in re-evaluation of the following:  HPI: Avana returns to this clinic in reevaluation of CVID treated with subcutaneous immunoglobulin, bronchiectasis, vocal cord paralysis, reflux, nocturnal deoxygenation.  I last saw her in this clinic 04 Aug 2023.  From an airway standpoint she is doing very well.  She continues to use her nebulized material once a day and she rarely needs to use a rescue medicine.  She does not really exert herself because of her musculoskeletal issues tied up with MS.  She has not had any infections.  Her reflux is doing very well on her current therapy and she has had very little problems with her throat recently.  She was having a bunch of problems with falling and she is now using a walker to ambulate and she exercises 3 times per week in a gym and does a water  class once a week.  When she completes these exercises she is completely washed out and has pretty much all she can do for the day.  If she stands for any period in time she does get a problem with unsteadiness but no vertigo.  She continues on oxygen  at nighttime.  Allergies as of 01/26/2024       Reactions   Contrast Media [iodinated Contrast Media] Rash   Iohexol  Hives   Methylprednisolone  Other (See Comments)   Decreases pts heart rate    Morphine  And Codeine  Other (See Comments)   Hallucinations    Tramadol  Other (See Comments)   Hallucinations   Tyloxapol Other (See Comments)   Hydrocodone  Itching   Oxycodone  Rash   Oxycodone -acetaminophen  Rash, Other (See Comments)   Hallucinations, also   Phenytoin Sodium Extended Rash   Tizanidine Hcl Rash   Zanaflex [tizanidine  Hydrochloride] Rash        Medication List    ACCRUFeR  30 MG Caps Generic drug: Ferric Maltol  Take 1 capsule (30 mg total) by mouth in the morning and at bedtime.   albuterol  (2.5 MG/3ML) 0.083% nebulizer solution Commonly known as: PROVENTIL  Take 3 mLs (2.5 mg total) by nebulization as directed.   amphetamine -dextroamphetamine  20 MG tablet Commonly known as: ADDERALL Take 20 mg by mouth 2 (two) times daily.   azithromycin  250 MG tablet Commonly known as: ZITHROMAX  Take 1 tablet (250 mg total) by mouth daily.   baclofen  10 MG tablet Commonly known as: LIORESAL  Take 1 tablet (10 mg total) by mouth in the morning.   bisacodyl  5 MG EC tablet Commonly known as: DULCOLAX Take 5 mg by mouth See admin instructions. Take 5 mg by mouth in the morning and evening   budesonide  0.5 MG/2ML nebulizer solution Commonly known as: PULMICORT  Take 2 mLs (0.5 mg total) by nebulization in the morning and at bedtime. Brush teeth after treatment.   calcium  carbonate 1250 (500 Ca) MG tablet Commonly known as: OS-CAL - dosed in mg of elemental calcium  Take 1 tablet (1,250 mg total) by mouth daily with breakfast.   dexlansoprazole  60 MG capsule Commonly known as: DEXILANT  Take 1 capsule (60 mg total) by mouth daily with breakfast.   famotidine  20 MG tablet Commonly known as: Pepcid  One after supper   fluconazole  200 MG tablet  Commonly known as: DIFLUCAN  Take 200 mg by mouth once a week.   FLUoxetine  40 MG capsule Commonly known as: PROZAC  Take 1 capsule (40 mg total) by mouth daily.   Flutter Devi Use as directed   Nebulizer Mask Adult Misc 1 Device by Does not apply route as directed.   formoterol  20 MCG/2ML nebulizer solution Commonly known as: Perforomist  Take 2 mLs (20 mcg total) by nebulization at bedtime. Brush teeth after treatment.   guaiFENesin  600 MG 12 hr tablet Commonly known as: MUCINEX  Take 1 tablet (600 mg total) by mouth 2 (two) times daily.   levothyroxine   100 MCG tablet Commonly known as: SYNTHROID  TAKE 1 TABLET BY MOUTH ONCE DAILY BEFORE BREAKFAST   MAGNESIUM  PO Take 1 tablet by mouth every evening.   methenamine  1 g tablet Commonly known as: HIPREX  Take 1 tablet (1 g total) by mouth 2 (two) times daily with a meal.   mirtazapine  15 MG tablet Commonly known as: REMERON  TAKE ONE TABLET BY MOUTH AT BEDTIME   Myrbetriq  50 MG Tb24 tablet Generic drug: mirabegron  ER Take 50 mg by mouth in the morning.   NON FORMULARY Take 1 tablet by mouth See admin instructions. [employer] Mature Multivitamin with Calcium - Take 1 tablet by mouth with breakfast in the morning   NON FORMULARY Take 1 capsule by mouth See admin instructions. [employer] probiotic advanced  digestive probiotic- Take 1 capsule by mouth in the morning   OXYGEN  Inhale 2-3.5 L/min into the lungs continuous. What changed: when to take this   PRESCRIPTION MEDICATION Apply 1 Device topically See admin instructions. Afflovest- For 30 minutes in the morning and evening- to begin @ 60% and work up to 80%   sodium chloride  HYPERTONIC 3 % nebulizer solution Take by nebulization 2 (two) times daily as needed for cough. Dx J44.9   tamsulosin  0.4 MG Caps capsule Commonly known as: FLOMAX  Take 0.4 mg by mouth in the morning.   temazepam  15 MG capsule Commonly known as: RESTORIL  Take 1 capsule (15 mg total) by mouth every evening.   torsemide  10 MG tablet Commonly known as: DEMADEX  Take 1 tablet (10 mg total) by mouth daily.   Vitaline CoQ10 300 MG Wafr Generic drug: Coenzyme Q10 Take 300 mg by mouth in the morning.   Yupelri  175 MCG/3ML nebulizer solution Generic drug: revefenacin  Take 3 mLs (175 mcg total) by nebulization every morning. Brush teeth after treatment.    Past Medical History:  Diagnosis Date   Arthritis    oa   Asthma    Bilateral vocal cord paralysis    Bronchiectasis (HCC)    Cervical cancer (HCC) 2005   Depression    GERD (gastroesophageal  reflux disease)    Hiatal hernia    Hypothyroidism    Immunodeficiency    Gets IVIG infusions   Insomnia    Multiple sclerosis 1996   Osteoporosis    Pneumonia 2024, 2014, 1992   Urine retention     Past Surgical History:  Procedure Laterality Date   ABDOMINAL HYSTERECTOMY  03/25/2003   for cervical cancer, complete   BRONCHIAL BIOPSY  10/26/2020   Procedure: BRONCHIAL BIOPSIES;  Surgeon: Brenna Adine CROME, DO;  Location: [facility];  Service: Pulmonary;;   BRONCHIAL BRUSHINGS  10/26/2020   Procedure: BRONCHIAL BRUSHINGS;  Surgeon: Brenna Adine CROME, DO;  Location: [facility];  Service: Pulmonary;;   BRONCHIAL WASHINGS  10/26/2020   Procedure: BRONCHIAL WASHINGS;  Surgeon: Brenna Adine CROME, DO;  Location: [facility];  Service:  Pulmonary;;   BRONCHIAL WASHINGS  08/19/2022   Procedure: BRONCHIAL WASHINGS;  Surgeon: Brenna Adine CROME, DO;  Location: [facility];  Service: Cardiopulmonary;;   CATARACT EXTRACTION, BILATERAL Bilateral    lens for cataracts   CONVERSION TO TOTAL HIP Right 09/18/2015   Procedure: CONVERSION OF RIGHT HEMI ARTHROPLASTY TO TOTAL HIP ARTHROPLASTY ACETABULAR REVISON ;  Surgeon: Donnice Car, MD;  Location: [facility];  Service: Orthopedics;  Laterality: Right;   HIP ARTHROPLASTY Right 03/01/2013   Procedure: ARTHROPLASTY BIPOLAR HIP;  Surgeon: Donnice JONETTA Car, MD;  Location: [facility];  Service: Orthopedics;  Laterality: Right;   HIP CLOSED REDUCTION Right 03/12/2013   Procedure: CLOSED REDUCTION HIP;  Surgeon: Dempsey LULLA Moan, MD;  Location: [facility];  Service: Orthopedics;  Laterality: Right;   HIP CLOSED REDUCTION Right 03/19/2013   Procedure: CLOSED REDUCTION HIP;  Surgeon: Reyes JAYSON Billing, MD;  Location: [facility];  Service: Orthopedics;  Laterality: Right;   HIP CLOSED REDUCTION Right 11/10/2015   Procedure: CLOSED MANIPULATION HIP;  Surgeon: Redell Shoals, MD;  Location: [facility];  Service: Orthopedics;  Laterality: Right;   HIP CLOSED REDUCTION Right 06/05/2017    Procedure: CLOSED REDUCTION HIP;  Surgeon: Heide Ingle, MD;  Location: [facility];  Service: Orthopedics;  Laterality: Right;   SMALL INTESTINE SURGERY     TEE WITHOUT CARDIOVERSION N/A 09/22/2022   Procedure: TRANSESOPHAGEAL ECHOCARDIOGRAM;  Surgeon: Okey Vina LULLA, MD;  Location: [facility];  Service: Cardiovascular;  Laterality: N/A;   TOTAL HIP REVISION Right 06/06/2017   Procedure: TOTAL HIP REVISION;  Surgeon: Heide Ingle, MD;  Location: [facility];  Service: Orthopedics;  Laterality: Right;   VESICOVAGINAL FISTULA CLOSURE W/ TAH  03/25/2003   VIDEO BRONCHOSCOPY N/A 10/26/2020   Procedure: VIDEO BRONCHOSCOPY WITH FLUORO;  Surgeon: Brenna Adine CROME, DO;  Location: [facility];  Service: Pulmonary;  Laterality: N/A;   VIDEO BRONCHOSCOPY Left 08/19/2022   Procedure: VIDEO BRONCHOSCOPY WITH FLUORO; WITH BAL;  Surgeon: Brenna Adine CROME, DO;  Location: [facility];  Service: Cardiopulmonary;  Laterality: Left;    Review of systems negative except as noted in HPI / PMHx or noted below:  Review of Systems  Constitutional: Negative.   HENT: Negative.    Eyes: Negative.   Respiratory: Negative.    Cardiovascular: Negative.   Gastrointestinal: Negative.   Genitourinary: Negative.   Musculoskeletal: Negative.   Skin: Negative.   Neurological: Negative.   Endo/Heme/Allergies: Negative.   Psychiatric/Behavioral: Negative.       Objective:   Vitals:   01/26/24 1358  BP: 118/68  Pulse: 87  Resp: 18  Temp: 98.1 F (36.7 C)  SpO2: 97%   Height: 5' 3.39 (161 cm)  Weight: 125 lb 6.4 oz (56.9 kg)   Physical Exam Constitutional:      Appearance: She is not diaphoretic.  HENT:     Head: Normocephalic.     Right Ear: Tympanic membrane, ear canal and external ear normal.     Left Ear: Tympanic membrane, ear canal and external ear normal.     Nose: Nose normal. No mucosal edema or rhinorrhea.     Mouth/Throat:     Pharynx: Uvula midline. No oropharyngeal exudate.  Eyes:      Conjunctiva/sclera: Conjunctivae normal.  Neck:     Thyroid : No thyromegaly.     Trachea: Trachea normal. No tracheal tenderness or tracheal deviation.  Cardiovascular:     Rate and Rhythm: Normal rate and regular rhythm.     Heart sounds: Normal  heart sounds, S1 normal and S2 normal. No murmur heard. Pulmonary:     Effort: No respiratory distress.     Breath sounds: Normal breath sounds. No stridor. No wheezing or rales.  Lymphadenopathy:     Head:     Right side of head: No tonsillar adenopathy.     Left side of head: No tonsillar adenopathy.     Cervical: No cervical adenopathy.  Skin:    Findings: No erythema or rash.     Nails: There is no clubbing.  Neurological:     Mental Status: She is alert.     Diagnostics: Results of blood tests obtained 04 Aug 2023 identifies IgG 1401 MGs/DL, IgA 52 mg/DL, IgM 26 mg/DL  Assessment and Plan:   1. CVID (common variable immunodeficiency) (HCC)   2. Bronchiectasis without complication (HCC)   3. Bilateral vocal cord paralysis   4. Gastroesophageal reflux disease, unspecified whether esophagitis present    1. Continue immunoglobulin infusions weekly - 9 gm Xembify   2.  Continue to treat inflammation of airway:   A. Budesonide  5 mg - nebulize 1-2 times per day  B. Formoterol  - nebulize 1- times per day  C. Yupelri  - nebulize 1 time per day  3. If needed:   A. Albuterol  nebulization every 4-6 hours  B. Percussion vest few times per day to remove mucous  4. Treat reflux with the following:   A. Continue Dexilant  60 in am and famotidine  20 in evening  5. Continue oxygen  in evening and sleep  6. Continue exercise program  7. Influenza = Tamiflu. Covid = Paxlovid   8. Return to clinic in 6 months or earlier if problem  Desteny is doing well from a respiratory standpoint and from a reflux standpoint and from a infectious disease standpoint utilizing a combination of therapy including her immunoglobulin infusions,  anti-inflammatory agents for airway, and therapy directed against reflux.  She will continue on this plan as well as her supplemental oxygen  at nighttime and I will see her back in this clinic in 6 months or earlier if there is a problem.  Camellia Denis, MD Allergy  / Immunology [facility]

## 2024-01-26 NOTE — Patient Instructions (Addendum)
  1. Continue immunoglobulin infusions weekly - 9 gm Xembify   2.  Continue to treat inflammation of airway:   A. Budesonide  5 mg - nebulize 1-2 times per day  B. Formoterol  - nebulize 1- times per day  C. Yupelri  - nebulize 1 time per day  3. If needed:   A. Albuterol  nebulization every 4-6 hours  B. Percussion vest few times per day to remove mucous  4. Treat reflux with the following:   A. Continue Dexilant  60 in am and famotidine  20 in evening  5. Continue oxygen  in evening and sleep  6. Continue exercise program  7. Influenza = Tamiflu. Covid = Paxlovid   8. Return to clinic in 6 months or earlier if problem

## 2024-01-27 ENCOUNTER — Encounter: Payer: Self-pay | Admitting: Allergy and Immunology

## 2024-02-02 ENCOUNTER — Ambulatory Visit (HOSPITAL_BASED_OUTPATIENT_CLINIC_OR_DEPARTMENT_OTHER): Attending: Neurology | Admitting: Physical Therapy

## 2024-02-02 ENCOUNTER — Other Ambulatory Visit: Payer: Self-pay

## 2024-02-02 ENCOUNTER — Encounter (HOSPITAL_BASED_OUTPATIENT_CLINIC_OR_DEPARTMENT_OTHER): Payer: Self-pay | Admitting: Physical Therapy

## 2024-02-02 DIAGNOSIS — M6281 Muscle weakness (generalized): Secondary | ICD-10-CM | POA: Insufficient documentation

## 2024-02-02 DIAGNOSIS — R262 Difficulty in walking, not elsewhere classified: Secondary | ICD-10-CM | POA: Insufficient documentation

## 2024-02-02 DIAGNOSIS — R2681 Unsteadiness on feet: Secondary | ICD-10-CM | POA: Insufficient documentation

## 2024-02-02 NOTE — Therapy (Unsigned)
 OUTPATIENT PHYSICAL THERAPY NEURO EVALUATION   Patient Name: Kayla [name] MRN: [id number] DOB:Jan 23, 1951, 73 y.o., female Today's Date: 02/03/2024   PCP: Debby FREDRIK Molt MD REFERRING PROVIDER: Murl Cough, MD   END OF SESSION:  PT End of Session - 02/02/24 1703     Visit Number 1    Date for Recertification  03/18/24    Authorization Type mcr    PT Start Time 0932    PT Stop Time 1015    PT Time Calculation (min) 43 min    Activity Tolerance Patient tolerated treatment well    Behavior During Therapy [facility] for tasks assessed/performed          Past Medical History:  Diagnosis Date   Arthritis    oa   Asthma    Bilateral vocal cord paralysis    Bronchiectasis (HCC)    Cervical cancer (HCC) 2005   Depression    GERD (gastroesophageal reflux disease)    Hiatal hernia    Hypothyroidism    Immunodeficiency    Gets IVIG infusions   Insomnia    Multiple sclerosis 1996   Osteoporosis    Pneumonia 2024, 2014, 1992   Urine retention    Past Surgical History:  Procedure Laterality Date   ABDOMINAL HYSTERECTOMY  03/25/2003   for cervical cancer, complete   BRONCHIAL BIOPSY  10/26/2020   Procedure: BRONCHIAL BIOPSIES;  Surgeon: Brenna Adine CROME, DO;  Location: [facility];  Service: Pulmonary;;   BRONCHIAL BRUSHINGS  10/26/2020   Procedure: BRONCHIAL BRUSHINGS;  Surgeon: Brenna Adine CROME, DO;  Location: [facility];  Service: Pulmonary;;   BRONCHIAL WASHINGS  10/26/2020   Procedure: BRONCHIAL WASHINGS;  Surgeon: Brenna Adine CROME, DO;  Location: [facility];  Service: Pulmonary;;   BRONCHIAL WASHINGS  08/19/2022   Procedure: BRONCHIAL WASHINGS;  Surgeon: Brenna Adine CROME, DO;  Location: [facility];  Service: Cardiopulmonary;;   CATARACT EXTRACTION, BILATERAL Bilateral    lens for cataracts   CONVERSION TO TOTAL HIP Right 09/18/2015   Procedure: CONVERSION OF RIGHT HEMI ARTHROPLASTY TO TOTAL HIP ARTHROPLASTY ACETABULAR REVISON ;  Surgeon: Cough Car, MD;   Location: [facility];  Service: Orthopedics;  Laterality: Right;   HIP ARTHROPLASTY Right 03/01/2013   Procedure: ARTHROPLASTY BIPOLAR HIP;  Surgeon: Cough JONETTA Car, MD;  Location: [facility];  Service: Orthopedics;  Laterality: Right;   HIP CLOSED REDUCTION Right 03/12/2013   Procedure: CLOSED REDUCTION HIP;  Surgeon: Dempsey LULLA Moan, MD;  Location: [facility];  Service: Orthopedics;  Laterality: Right;   HIP CLOSED REDUCTION Right 03/19/2013   Procedure: CLOSED REDUCTION HIP;  Surgeon: Reyes JAYSON Billing, MD;  Location: [facility];  Service: Orthopedics;  Laterality: Right;   HIP CLOSED REDUCTION Right 11/10/2015   Procedure: CLOSED MANIPULATION HIP;  Surgeon: Redell Shoals, MD;  Location: [facility];  Service: Orthopedics;  Laterality: Right;   HIP CLOSED REDUCTION Right 06/05/2017   Procedure: CLOSED REDUCTION HIP;  Surgeon: Heide Ingle, MD;  Location: [facility];  Service: Orthopedics;  Laterality: Right;   SMALL INTESTINE SURGERY     TEE WITHOUT CARDIOVERSION N/A 09/22/2022   Procedure: TRANSESOPHAGEAL ECHOCARDIOGRAM;  Surgeon: Okey Vina LULLA, MD;  Location: [facility] INVASIVE CV LAB;  Service: Cardiovascular;  Laterality: N/A;   TOTAL HIP REVISION Right 06/06/2017   Procedure: TOTAL HIP REVISION;  Surgeon: Heide Ingle, MD;  Location: [facility];  Service: Orthopedics;  Laterality: Right;   VESICOVAGINAL FISTULA CLOSURE W/ TAH  03/25/2003   VIDEO BRONCHOSCOPY N/A 10/26/2020   Procedure: VIDEO  BRONCHOSCOPY WITH FLUORO;  Surgeon: Brenna Adine CROME, DO;  Location: [facility];  Service: Pulmonary;  Laterality: N/A;   VIDEO BRONCHOSCOPY Left 08/19/2022   Procedure: VIDEO BRONCHOSCOPY WITH FLUORO; WITH BAL;  Surgeon: Brenna Adine CROME, DO;  Location: [facility];  Service: Cardiopulmonary;  Laterality: Left;   Patient Active Problem List   Diagnosis Date Noted   Neuropathy 11/14/2022   Mild protein malnutrition 10/18/2022   Chronic respiratory failure with hypoxia (HCC) 09/29/2022   Hypogammaglobulinemia 09/22/2022    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) 07/08/2022   GERD (gastroesophageal reflux disease) 06/20/2022   Chronic obstructive pulmonary disease (HCC) 09/24/2021   Pulmonary mycobacterial infection (HCC) 09/24/2021   Bilateral vocal cord paralysis 09/24/2021   UTI (urinary tract infection) 05/24/2021   COPD (chronic obstructive pulmonary disease) (HCC)    Iron deficiency anemia secondary to inadequate dietary iron intake 12/20/2020   Purple toe syndrome of both feet (HCC) 07/26/2020   Cough variant asthma vs vcd  related to MS  11/10/2016   Positional sleep apnea 09/10/2016   Periodic limb movements of sleep 09/10/2016   OSA on CPAP 02/08/2016   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/21/2013   Constipation 02/26/2013   Hyponatremia 05/20/2012   Post-menopausal osteoporosis 09/17/2007   Hypothyroidism 04/01/2007   Multiple sclerosis 04/01/2007     ONSET DATE: chronic  REFERRING DIAG:  G35 (ICD-10-CM) - Multiple sclerosis  G82.50 (ICD-10-CM) - Quadriplegia, unspecified    THERAPY DIAG:  Muscle weakness (generalized)  Unsteadiness on feet  Difficulty in walking, not elsewhere classified  Rationale for Evaluation and Treatment: Rehabilitation  SUBJECTIVE:                                                                                                                                                                                             SUBJECTIVE STATEMENT: MS x 29 years.  Clemens march 2024 fx pelvis.  Don't feel feet since June.  Hosp x 3 in June for pneumonia.  Not on any MS meds at this time.  Do Less pain with [employer] water  classes every Sunday since May 2025. Come into gym 2 x a week and use machines.  Problems is my balance. The only place I can walk well is in the pool.  Pt arrives today using rollator and has Bioness right knee (x 7 years).  Pt accompanied by: self  PERTINENT HISTORY: MS   PAIN:  Are you having pain? Yes: NPRS scale: 0/10 Pain location:  Intermittent right groin and legs. Pain description: intermittent Aggravating factors: MS Relieving factors: rest  PRECAUTIONS: Fall  RED FLAGS: None   WEIGHT BEARING  RESTRICTIONS: No  FALLS: Has patient fallen in last 6 months? No  LIVING ENVIRONMENT: Lives with: lives with their spouse Lives in: House/apartment Stairs: 4 about home Has following equipment at home: Environmental Consultant - 4 wheeled, Shower bench, Grab bars, and modified vehicle with hand controls, scooter  PLOF: Independent with community mobility with device  PATIENT GOALS: improve balance; walk better, be able to stand and cook  OBJECTIVE:  Note: Objective measures were completed at Evaluation unless otherwise noted.  COGNITION: Overall cognitive status: Within functional limits for tasks assessed   SENSATION: impaired  COORDINATION: LE impaired  MUSCLE TONE: LE impaired  MUSCLE LENGTH: hyper knees into ext  DTRs: TBT  POSTURE: anterior pelvic tilt, right pelvic obliquity, weight shift left, and bilateral hyperextension of knees  LOWER EXTREMITY ROM:     Wfl  LOWER EXTREMITY MMT:    MMT Right Eval Left Eval  Hip flexion 3- 3-  Hip extension    Hip abduction    Hip adduction    Hip internal rotation    Hip external rotation    Knee flexion 3 3  Knee extension 3 3  Ankle dorsiflexion 2 3  Ankle plantarflexion    Ankle inversion    Ankle eversion    (Blank rows = not tested)  BED MOBILITY:  indep  TRANSFERS: Sit to stand: Modified independence  Assistive device utilized: Environmental Consultant - 4 wheeled    heavy ue support   STAIRS: 4 steps occasionally used with hand rails indep GAIT: Findings: Gait Characteristics: Right hip hike, genu recurvatum- Right, genu recurvatum- Left, trendelenburg, and trunk flexed, Distance walked: 500 ft, Assistive device utilized:Walker - 4 wheeled, Level of assistance: Modified independence  FUNCTIONAL TESTS:  Timed up and go (TUG): 46.46 2 MWT128 ft using  rollator 4 stage balance: FT close supervision; semi tandem, tandem and SLS unable to complete  PATIENT SURVEYS:  tba                                                                                                                              TREATMENT  Eval Self care:Posture and optometrist instruction    PATIENT EDUCATION: Education details: Discussed eval findings, rehab rationale, aquatic program progression/POC and pools in area. Patient is in agreement  Person educated: Patient Education method: Explanation Education comprehension: verbalized understanding  HOME EXERCISE PROGRAM: TBA  GOALS: Goals reviewed with patient? Yes  SHORT TERM GOALS: Target date: 02/21/24  Pt will tolerate full aquatic sessions consistently without increase in pain and with improving function to demonstrate good toleration and effectiveness of intervention.  Baseline: Goal status: INITIAL    LONG TERM GOALS: Target date: 03/18/25  Pt will be indep with final Aquatic HEP for continued management of condition Baseline:  Goal status: INITIAL  2.  Pt will improve to at least 148ft  to demonstrate improved ability to ambulate into and out of MD offices/clinics. (MCD = 44ft) Baseline: 128 ft Goal  status: INITIAL  3.  Pt will improve on Tug test to <or=  40s to demonstrate improvement in lower extremity function, mobility and decreased fall risk. Baseline: 46.46 Goal status: INITIAL  4.  Pt will report improved walking ability and balance with daily function. Baseline:  Goal status: INITIAL  5.  Pt will demonstrate mindfulness with posture for improved alignment Baseline:  Goal status: INITIAL    ASSESSMENT:  CLINICAL IMPRESSION: Patient is a 73 y.o. f who was seen today for physical therapy evaluation and treatment for MS/quad.  Pt presents ambulating with rollator with Bioness donned rle x 500 ft to setting indep.  MS dx >25 years ago. Pt reports an active life style  despite dysfunctions. She partakes in water  exercise class 1 x week as well as completing exercise regimen using machines at [facility] 2 x weekly.  She has all needed adaptive equipment for safety and function at home.  She did have a fall 1 year ago sustaining a fx of pelvis for which she has recovered.  She presents today with goals of improved balance and amb ability/quality.  She is a high fall risk. No falls as per pt since march 2024.  She will benefit from a short episode of aquatic therapy intervention to address balance and gait.  She VU of importance of continuing her indep exercise regimen with aquatic therapy intervention in addition.  OBJECTIVE IMPAIRMENTS: Abnormal gait, decreased activity tolerance, decreased balance, decreased coordination, decreased endurance, decreased mobility, difficulty walking, decreased strength, impaired tone, and postural dysfunction.   ACTIVITY LIMITATIONS: carrying, lifting, bending, sitting, standing, squatting, stairs, transfers, and locomotion level  PARTICIPATION LIMITATIONS: meal prep, cleaning, laundry, shopping, community activity, yard work, and cooking  PERSONAL FACTORS: Time since onset of injury/illness/exacerbation are also affecting patient's functional outcome.   REHAB POTENTIAL: Fair -good for stated goals  CLINICAL DECISION MAKING: Stable/uncomplicated  EVALUATION COMPLEXITY: Low  PLAN:  PT FREQUENCY: 1-2x/week  PT DURATION: 6 weeks  PLANNED INTERVENTIONS: 97164- PT Re-evaluation, 97750- Physical Performance Testing, 97110-Therapeutic exercises, 97530- Therapeutic activity, [id number]- Neuromuscular re-education, 97535- Self Care, [id number]- Manual therapy, [phone]- Gait training, [phone]- Aquatic Therapy, [phone]- Ionotophoresis 4mg /ml Dexamethasone , 79439 (1-2 muscles), 20561 (3+ muscles)- Dry Needling, Patient/Family education, Balance training, Stair training, Taping, Joint mobilization, DME instructions, Cryotherapy, and Moist heat  PLAN FOR  NEXT SESSION: aquatics: strengthening; balance retraining, gait HEP   Ronal Foots) Shykeem Resurreccion MPT 02/03/24 9:31 AM [facility] [address] [place], [place], [address] Phone: [phone]   Fax:  [phone]

## 2024-02-05 ENCOUNTER — Ambulatory Visit: Attending: Internal Medicine | Admitting: Physical Therapy

## 2024-02-05 ENCOUNTER — Encounter: Payer: Self-pay | Admitting: Physical Therapy

## 2024-02-05 VITALS — BP 106/43 | HR 86

## 2024-02-05 DIAGNOSIS — S32591D Other specified fracture of right pubis, subsequent encounter for fracture with routine healing: Secondary | ICD-10-CM | POA: Insufficient documentation

## 2024-02-05 DIAGNOSIS — R262 Difficulty in walking, not elsewhere classified: Secondary | ICD-10-CM | POA: Diagnosis present

## 2024-02-05 DIAGNOSIS — R2681 Unsteadiness on feet: Secondary | ICD-10-CM | POA: Insufficient documentation

## 2024-02-05 DIAGNOSIS — M6281 Muscle weakness (generalized): Secondary | ICD-10-CM | POA: Diagnosis present

## 2024-02-05 DIAGNOSIS — S32592D Other specified fracture of left pubis, subsequent encounter for fracture with routine healing: Secondary | ICD-10-CM | POA: Insufficient documentation

## 2024-02-05 NOTE — Patient Instructions (Signed)
 Access Code: [email or web address] URL: [email or web address] Date: 02/05/2024 Prepared by: Daved Bull  Exercises - Corner Balance Feet Together With Eyes Open  - 1 x daily - 4 x weekly - 1 sets - 2-3 reps - 30 seconds hold - Standing Near Stance in [place] with Eyes Closed  - 1 x daily - 4 x weekly - 1 sets - 2-3 reps - 30 seconds hold - Heel Raises with Counter Support  - 1 x daily - 4 x weekly - 1-2 sets - 5-6 reps - Squat with Chair Touch  - 1 x daily - 4 x weekly - 2 sets - 10 reps - Side Stepping with Resistance at Thighs and Counter Support  - 1 x daily - 4 x weekly - 3 sets - 10 reps

## 2024-02-05 NOTE — Therapy (Signed)
 OUTPATIENT PHYSICAL THERAPY NEURO TREATMENT   Patient Name: MANPREET KEMMER MRN: [id number] DOB:22-Jun-1950, 73 y.o., female Today's Date: 02/05/2024   PCP: Debby FREDRIK Molt MD REFERRING PROVIDER: Murl Cough, MD   END OF SESSION:  PT End of Session - 02/05/24 1327     Visit Number 2    Number of Visits 13   12 + eval   Date for Recertification  03/18/24    Authorization Type mcr    Progress Note Due on Visit 10    PT Start Time 1319    PT Stop Time 1401    PT Time Calculation (min) 42 min    Equipment Utilized During Treatment Gait belt    Activity Tolerance Patient tolerated treatment well    Behavior During Therapy WFL for tasks assessed/performed          Past Medical History:  Diagnosis Date   Arthritis    oa   Asthma    Bilateral vocal cord paralysis    Bronchiectasis (HCC)    Cervical cancer (HCC) 2005   Depression    GERD (gastroesophageal reflux disease)    Hiatal hernia    Hypothyroidism    Immunodeficiency    Gets IVIG infusions   Insomnia    Multiple sclerosis 1996   Osteoporosis    Pneumonia 2024, 2014, 1992   Urine retention    Past Surgical History:  Procedure Laterality Date   ABDOMINAL HYSTERECTOMY  03/25/2003   for cervical cancer, complete   BRONCHIAL BIOPSY  10/26/2020   Procedure: BRONCHIAL BIOPSIES;  Surgeon: Brenna Adine CROME, DO;  Location: [facility];  Service: Pulmonary;;   BRONCHIAL BRUSHINGS  10/26/2020   Procedure: BRONCHIAL BRUSHINGS;  Surgeon: Brenna Adine CROME, DO;  Location: [facility];  Service: Pulmonary;;   BRONCHIAL WASHINGS  10/26/2020   Procedure: BRONCHIAL WASHINGS;  Surgeon: Brenna Adine CROME, DO;  Location: [facility];  Service: Pulmonary;;   BRONCHIAL WASHINGS  08/19/2022   Procedure: BRONCHIAL WASHINGS;  Surgeon: Brenna Adine CROME, DO;  Location: [facility];  Service: Cardiopulmonary;;   CATARACT EXTRACTION, BILATERAL Bilateral    lens for cataracts   CONVERSION TO TOTAL HIP Right 09/18/2015    Procedure: CONVERSION OF RIGHT HEMI ARTHROPLASTY TO TOTAL HIP ARTHROPLASTY ACETABULAR REVISON ;  Surgeon: Cough Car, MD;  Location: [facility];  Service: Orthopedics;  Laterality: Right;   HIP ARTHROPLASTY Right 03/01/2013   Procedure: ARTHROPLASTY BIPOLAR HIP;  Surgeon: Cough JONETTA Car, MD;  Location: [facility];  Service: Orthopedics;  Laterality: Right;   HIP CLOSED REDUCTION Right 03/12/2013   Procedure: CLOSED REDUCTION HIP;  Surgeon: Dempsey LULLA Moan, MD;  Location: [facility];  Service: Orthopedics;  Laterality: Right;   HIP CLOSED REDUCTION Right 03/19/2013   Procedure: CLOSED REDUCTION HIP;  Surgeon: Reyes JAYSON Billing, MD;  Location: [facility];  Service: Orthopedics;  Laterality: Right;   HIP CLOSED REDUCTION Right 11/10/2015   Procedure: CLOSED MANIPULATION HIP;  Surgeon: Redell Shoals, MD;  Location: [facility];  Service: Orthopedics;  Laterality: Right;   HIP CLOSED REDUCTION Right 06/05/2017   Procedure: CLOSED REDUCTION HIP;  Surgeon: Heide Ingle, MD;  Location: [facility];  Service: Orthopedics;  Laterality: Right;   SMALL INTESTINE SURGERY     TEE WITHOUT CARDIOVERSION N/A 09/22/2022   Procedure: TRANSESOPHAGEAL ECHOCARDIOGRAM;  Surgeon: Okey Vina LULLA, MD;  Location: [facility] IN[facility];  Service: Cardiovascular;  Laterality: N/A;   TOTAL HIP REVISION Right 06/06/2017   Procedure: TOTAL HIP REVISION;  Surgeon: Heide Ingle, MD;  Location: [facility];  Service: Orthopedics;  Laterality: Right;   VESICOVAGINAL FISTULA CLOSURE W/ TAH  03/25/2003   VIDEO BRONCHOSCOPY N/A 10/26/2020   Procedure: VIDEO BRONCHOSCOPY WITH FLUORO;  Surgeon: Brenna Adine CROME, DO;  Location: [facility];  Service: Pulmonary;  Laterality: N/A;   VIDEO BRONCHOSCOPY Left 08/19/2022   Procedure: VIDEO BRONCHOSCOPY WITH FLUORO; WITH BAL;  Surgeon: Brenna Adine CROME, DO;  Location: [facility];  Service: Cardiopulmonary;  Laterality: Left;   Patient Active Problem List   Diagnosis Date Noted   Neuropathy 11/14/2022   Mild protein  malnutrition 10/18/2022   Chronic respiratory failure with hypoxia (HCC) 09/29/2022   Hypogammaglobulinemia 09/22/2022   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) 07/08/2022   GERD (gastroesophageal reflux disease) 06/20/2022   Chronic obstructive pulmonary disease (HCC) 09/24/2021   Pulmonary mycobacterial infection (HCC) 09/24/2021   Bilateral vocal cord paralysis 09/24/2021   UTI (urinary tract infection) 05/24/2021   COPD (chronic obstructive pulmonary disease) (HCC)    Iron deficiency anemia secondary to inadequate dietary iron intake 12/20/2020   Purple toe syndrome of both feet (HCC) 07/26/2020   Cough variant asthma vs vcd  related to MS  11/10/2016   Positional sleep apnea 09/10/2016   Periodic limb movements of sleep 09/10/2016   OSA on CPAP 02/08/2016   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/21/2013   Constipation 02/26/2013   Hyponatremia 05/20/2012   Post-menopausal osteoporosis 09/17/2007   Hypothyroidism 04/01/2007   Multiple sclerosis 04/01/2007     ONSET DATE: chronic  REFERRING DIAG:  G35 (ICD-10-CM) - Multiple sclerosis  G82.50 (ICD-10-CM) - Quadriplegia, unspecified    THERAPY DIAG:  Muscle weakness (generalized)  Unsteadiness on feet  Difficulty in walking, not elsewhere classified  Closed bilateral fracture of pubic rami with routine healing, subsequent encounter  Rationale for Evaluation and Treatment: Rehabilitation  SUBJECTIVE:                                                                                                                                                                                             SUBJECTIVE STATEMENT: Pt recounts medical journey for new PT:  MS x 29 years.  [place] march 2024 fx pelvis.  Don't feel feet since June.  Hosp x 3 in June for pneumonia.  Not on any MS meds at this time.  Has tried IVIG without success.  Do Less pain with [employer] water  classes every Sunday since May 2025. Come into gym 2 x a week  and use machines.  She has tolerated the 92 deg pool at [facility] without known symptom exacerbation.  Pt arrives today using rollator and has Bioness right  knee (x 5-7 years).  She endorses she drives with her feet, not hand controls. Pt accompanied by: self  PERTINENT HISTORY: MS   PAIN:  Are you having pain? Yes: NPRS scale: 0/10 Pain location: Intermittent right groin and legs. Pain description: intermittent Aggravating factors: MS Relieving factors: rest  PRECAUTIONS: Fall  RED FLAGS: None   WEIGHT BEARING RESTRICTIONS: No  FALLS: Has patient fallen in last 6 months? No  LIVING ENVIRONMENT: Lives with: lives with their spouse Lives in: House/apartment Stairs: 4 about home Has following equipment at home: Environmental Consultant - 4 wheeled, Shower bench, Grab bars, and modified vehicle with hand controls, scooter  PLOF: Independent with community mobility with device  PATIENT GOALS: improve balance; walk better, be able to stand and cook  OBJECTIVE:  Note: Objective measures were completed at Evaluation unless otherwise noted.  COGNITION: Overall cognitive status: Within functional limits for tasks assessed   SENSATION: impaired  COORDINATION: LE impaired  MUSCLE TONE: LE impaired  MUSCLE LENGTH: hyper knees into ext  DTRs: TBT  POSTURE: anterior pelvic tilt, right pelvic obliquity, weight shift left, and bilateral hyperextension of knees  LOWER EXTREMITY ROM:     Wfl  LOWER EXTREMITY MMT:    MMT Right Eval Left Eval  Hip flexion 3- 3-  Hip extension    Hip abduction    Hip adduction    Hip internal rotation    Hip external rotation    Knee flexion 3 3  Knee extension 3 3  Ankle dorsiflexion 2 3  Ankle plantarflexion    Ankle inversion    Ankle eversion    (Blank rows = not tested)  BED MOBILITY:  indep  TRANSFERS: Sit to stand: Modified independence  Assistive device utilized: Environmental Consultant - 4 wheeled    heavy ue support   STAIRS: 4 steps occasionally  used with hand rails indep GAIT: Findings: Gait Characteristics: Right hip hike, genu recurvatum- Right, genu recurvatum- Left, trendelenburg, and trunk flexed, Distance walked: 500 ft, Assistive device utilized:Walker - 4 wheeled, Level of assistance: Modified independence  FUNCTIONAL TESTS:  Timed up and go (TUG): 46.46 2 MWT128 ft using rollator 4 stage balance: FT close supervision; semi tandem, tandem and SLS unable to complete  PATIENT SURVEYS:  tba                                                                                                                              TREATMENT  -Time spent scheduling - provided printout  Initiated HEP: Access Code: [email or web address] URL: [email or web address] Date: 02/05/2024 Prepared by: Daved Bull  Exercises - Corner Balance Feet Together With Eyes Open  - 1 x daily - 4 x weekly - 1 sets - 2-3 reps - 30 seconds hold - Standing Near Stance in [place] with Eyes Closed  - 1 x daily - 4 x weekly - 1 sets - 2-3 reps - 30 seconds hold - Heel Raises with Counter Support  - 1  x daily - 4 x weekly - 1-2 sets - 5-6 reps - Squat with Chair Touch  - 1 x daily - 4 x weekly - 2 sets - 10 reps - Side Stepping with Resistance at Thighs and Counter Support  - 1 x daily - 4 x weekly - 3 sets - 10 reps  -Assessed vitals at end of session: Vitals:   02/05/24 1325  BP: (!) 106/43  Pulse: 86  SpO2: 97%  -Edu on monitoring BP at home if able and seeking medical attention if diastolic drops lower than this.  Recommended compression stockings for days of low BP.  Pt asymptomatic (no dizziness/light headedness or headache).  PATIENT EDUCATION: Education details: Pt goals of standing endurance and goals of aquatic therapy for HEP.  Discussed bioness parameters which pt ind in management - reports needing to turn up intensity when fatigued as R toe IR - discussed steering electrodes which she reports having/using.  Monitoring vitals. Person  educated: Patient Education method: Explanation Education comprehension: verbalized understanding  HOME EXERCISE PROGRAM: Access Code: [email or web address] URL: [email or web address] Date: 02/05/2024 Prepared by: Daved Bull  Exercises - Corner Balance Feet Together With Eyes Open  - 1 x daily - 4 x weekly - 1 sets - 2-3 reps - 30 seconds hold - Standing Near Stance in [place] with Eyes Closed  - 1 x daily - 4 x weekly - 1 sets - 2-3 reps - 30 seconds hold - Heel Raises with Counter Support  - 1 x daily - 4 x weekly - 1-2 sets - 5-6 reps - Squat with Chair Touch  - 1 x daily - 4 x weekly - 2 sets - 10 reps - Side Stepping with Resistance at Thighs and Counter Support  - 1 x daily - 4 x weekly - 3 sets - 10 reps  GOALS: Goals reviewed with patient? Yes  SHORT TERM GOALS: Target date: 02/21/24  Pt will tolerate full aquatic sessions consistently without increase in pain and with improving function to demonstrate good toleration and effectiveness of intervention.  Baseline: Goal status: INITIAL    LONG TERM GOALS: Target date: 03/18/25  Pt will be indep with final Aquatic HEP for continued management of condition Baseline:  Goal status: INITIAL  2.  Pt will improve to at least 19ft  to demonstrate improved ability to ambulate into and out of MD offices/clinics. (MCD = 6ft) Baseline: 128 ft Goal status: INITIAL  3.  Pt will improve on Tug test to <or=  40s to demonstrate improvement in lower extremity function, mobility and decreased fall risk. Baseline: 46.46 Goal status: INITIAL  4.  Pt will report improved walking ability and balance with daily function. Baseline:  Goal status: INITIAL  5.  Pt will demonstrate mindfulness with posture for improved alignment Baseline:  Goal status: INITIAL    ASSESSMENT:  CLINICAL IMPRESSION: Patient seen for land follow-up agreeable to land and aquatic POC so visits scheduled.  She has bilateral knee hyperextension  despite R Bioness IND.  She has trialed knee cages and other options for motor control in the past, but requests to focus more functionally.  PT feels she would benefit from aquatic balance focus and land strength and gait mechanics focus.  Will continue per POC.  OBJECTIVE IMPAIRMENTS: Abnormal gait, decreased activity tolerance, decreased balance, decreased coordination, decreased endurance, decreased mobility, difficulty walking, decreased strength, impaired tone, and postural dysfunction.   ACTIVITY LIMITATIONS: carrying, lifting, bending, sitting, standing, squatting, stairs, transfers, and locomotion level  PARTICIPATION LIMITATIONS: meal prep, cleaning, laundry, shopping, community activity, yard work, and cooking  PERSONAL FACTORS: Time since onset of injury/illness/exacerbation are also affecting patient's functional outcome.   REHAB POTENTIAL: Fair -good for stated goals  CLINICAL DECISION MAKING: Stable/uncomplicated  EVALUATION COMPLEXITY: Low  PLAN:  PT FREQUENCY: 1-2x/week  PT DURATION: 6 weeks  PLANNED INTERVENTIONS: 97164- PT Re-evaluation, 97750- Physical Performance Testing, 97110-Therapeutic exercises, 97530- Therapeutic activity, [id number]- Neuromuscular re-education, 97535- Self Care, [id number]- Manual therapy, [phone]- Gait training, [phone]- Aquatic Therapy, [phone]- Ionotophoresis 4mg /ml Dexamethasone , 79439 (1-2 muscles), 20561 (3+ muscles)- Dry Needling, Patient/Family education, Balance training, Stair training, Taping, Joint mobilization, DME instructions, Cryotherapy, and Moist heat  PLAN FOR NEXT SESSION: aquatics: strengthening; balance retraining, gait HEP  Aquatic HEP/ai chi; standing tolerance and BLE strength, leg press, bilateral stance training/static balance - tilt board?  Daved Bull, PT, DPT

## 2024-02-09 ENCOUNTER — Ambulatory Visit: Admitting: Physical Therapy

## 2024-02-09 ENCOUNTER — Encounter: Payer: Self-pay | Admitting: Physical Therapy

## 2024-02-09 VITALS — BP 112/55 | HR 69

## 2024-02-09 DIAGNOSIS — R262 Difficulty in walking, not elsewhere classified: Secondary | ICD-10-CM

## 2024-02-09 DIAGNOSIS — M6281 Muscle weakness (generalized): Secondary | ICD-10-CM | POA: Diagnosis not present

## 2024-02-09 DIAGNOSIS — R2681 Unsteadiness on feet: Secondary | ICD-10-CM | POA: Diagnosis not present

## 2024-02-09 DIAGNOSIS — S32592D Other specified fracture of left pubis, subsequent encounter for fracture with routine healing: Secondary | ICD-10-CM | POA: Diagnosis not present

## 2024-02-09 DIAGNOSIS — S32591D Other specified fracture of right pubis, subsequent encounter for fracture with routine healing: Secondary | ICD-10-CM | POA: Diagnosis not present

## 2024-02-09 NOTE — Therapy (Signed)
 OUTPATIENT PHYSICAL THERAPY NEURO TREATMENT   Patient Name: Kayla [name] MRN: [id number] DOB:Aug 05, 1950, 73 y.o., female Today's Date: 02/09/2024   PCP: Debby FREDRIK Molt MD REFERRING PROVIDER: Murl Cough, MD   END OF SESSION:  PT End of Session - 02/09/24 0937     Visit Number 3    Number of Visits 13   12 + eval   Date for Recertification  03/18/24    Authorization Type mcr    Progress Note Due on Visit 10    PT Start Time 0932    PT Stop Time 1016    PT Time Calculation (min) 44 min    Equipment Utilized During Treatment Gait belt    Activity Tolerance Patient tolerated treatment well    Behavior During Therapy WFL for tasks assessed/performed          Past Medical History:  Diagnosis Date   Arthritis    oa   Asthma    Bilateral vocal cord paralysis    Bronchiectasis (HCC)    Cervical cancer (HCC) 2005   Depression    GERD (gastroesophageal reflux disease)    Hiatal hernia    Hypothyroidism    Immunodeficiency    Gets IVIG infusions   Insomnia    Multiple sclerosis 1996   Osteoporosis    Pneumonia 2024, 2014, 1992   Urine retention    Past Surgical History:  Procedure Laterality Date   ABDOMINAL HYSTERECTOMY  03/25/2003   for cervical cancer, complete   BRONCHIAL BIOPSY  10/26/2020   Procedure: BRONCHIAL BIOPSIES;  Surgeon: Brenna Adine CROME, DO;  Location: [facility];  Service: Pulmonary;;   BRONCHIAL BRUSHINGS  10/26/2020   Procedure: BRONCHIAL BRUSHINGS;  Surgeon: Brenna Adine CROME, DO;  Location: [facility];  Service: Pulmonary;;   BRONCHIAL WASHINGS  10/26/2020   Procedure: BRONCHIAL WASHINGS;  Surgeon: Brenna Adine CROME, DO;  Location: [facility];  Service: Pulmonary;;   BRONCHIAL WASHINGS  08/19/2022   Procedure: BRONCHIAL WASHINGS;  Surgeon: Brenna Adine CROME, DO;  Location: [facility];  Service: Cardiopulmonary;;   CATARACT EXTRACTION, BILATERAL Bilateral    lens for cataracts   CONVERSION TO TOTAL HIP Right 09/18/2015    Procedure: CONVERSION OF RIGHT HEMI ARTHROPLASTY TO TOTAL HIP ARTHROPLASTY ACETABULAR REVISON ;  Surgeon: Cough Car, MD;  Location: [facility];  Service: Orthopedics;  Laterality: Right;   HIP ARTHROPLASTY Right 03/01/2013   Procedure: ARTHROPLASTY BIPOLAR HIP;  Surgeon: Cough JONETTA Car, MD;  Location: [facility];  Service: Orthopedics;  Laterality: Right;   HIP CLOSED REDUCTION Right 03/12/2013   Procedure: CLOSED REDUCTION HIP;  Surgeon: Dempsey LULLA Moan, MD;  Location: [facility];  Service: Orthopedics;  Laterality: Right;   HIP CLOSED REDUCTION Right 03/19/2013   Procedure: CLOSED REDUCTION HIP;  Surgeon: Reyes JAYSON Billing, MD;  Location: [facility];  Service: Orthopedics;  Laterality: Right;   HIP CLOSED REDUCTION Right 11/10/2015   Procedure: CLOSED MANIPULATION HIP;  Surgeon: Redell Shoals, MD;  Location: [facility];  Service: Orthopedics;  Laterality: Right;   HIP CLOSED REDUCTION Right 06/05/2017   Procedure: CLOSED REDUCTION HIP;  Surgeon: Heide Ingle, MD;  Location: [facility];  Service: Orthopedics;  Laterality: Right;   SMALL INTESTINE SURGERY     TEE WITHOUT CARDIOVERSION N/A 09/22/2022   Procedure: TRANSESOPHAGEAL ECHOCARDIOGRAM;  Surgeon: Okey Vina LULLA, MD;  [facility];  Service: Cardiovascular;  Laterality: N/A;   TOTAL HIP REVISION Right 06/06/2017   Procedure: TOTAL HIP REVISION;  Surgeon: Heide Ingle, MD;  Location: [facility];  Service: Orthopedics;  Laterality: Right;   VESICOVAGINAL FISTULA CLOSURE W/ TAH  03/25/2003   VIDEO BRONCHOSCOPY N/A 10/26/2020   Procedure: VIDEO BRONCHOSCOPY WITH FLUORO;  Surgeon: Brenna Adine CROME, DO;  Location: [facility];  Service: Pulmonary;  Laterality: N/A;   VIDEO BRONCHOSCOPY Left 08/19/2022   Procedure: VIDEO BRONCHOSCOPY WITH FLUORO; WITH BAL;  Surgeon: Brenna Adine CROME, DO;  Location: [facility];  Service: Cardiopulmonary;  Laterality: Left;   Patient Active Problem List   Diagnosis Date Noted   Neuropathy 11/14/2022   Mild protein  malnutrition 10/18/2022   Chronic respiratory failure with hypoxia (HCC) 09/29/2022   Hypogammaglobulinemia 09/22/2022   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) 07/08/2022   GERD (gastroesophageal reflux disease) 06/20/2022   Chronic obstructive pulmonary disease (HCC) 09/24/2021   Pulmonary mycobacterial infection (HCC) 09/24/2021   Bilateral vocal cord paralysis 09/24/2021   UTI (urinary tract infection) 05/24/2021   COPD (chronic obstructive pulmonary disease) (HCC)    Iron deficiency anemia secondary to inadequate dietary iron intake 12/20/2020   Purple toe syndrome of both feet (HCC) 07/26/2020   Cough variant asthma vs vcd  related to MS  11/10/2016   Positional sleep apnea 09/10/2016   Periodic limb movements of sleep 09/10/2016   OSA on CPAP 02/08/2016   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/21/2013   Constipation 02/26/2013   Hyponatremia 05/20/2012   Post-menopausal osteoporosis 09/17/2007   Hypothyroidism 04/01/2007   Multiple sclerosis 04/01/2007     ONSET DATE: chronic  REFERRING DIAG:  G35 (ICD-10-CM) - Multiple sclerosis  G82.50 (ICD-10-CM) - Quadriplegia, unspecified    THERAPY DIAG:  Muscle weakness (generalized)  Unsteadiness on feet  Difficulty in walking, not elsewhere classified  Rationale for Evaluation and Treatment: Rehabilitation  SUBJECTIVE:                                                                                                                                                                                             SUBJECTIVE STATEMENT: Pt reports her right knee is hurting her some today, but she is unsure why.  She did her usual gym routine including leg press w/ same weight.  She denies allowing hyperextension when using press.  She states it does not seem swollen to her nor does it feel warm.  She denies falls or other acute issues. Pt accompanied by: self  PERTINENT HISTORY: MS   PAIN:  Are you having pain? Yes:  NPRS scale: 4/10 Pain location: R knee Pain description: intermittent Aggravating factors: walking - feels like it might give out Relieving factors: rest  PRECAUTIONS: Fall  RED FLAGS: None  WEIGHT BEARING RESTRICTIONS: No  FALLS: Has patient fallen in last 6 months? No  LIVING ENVIRONMENT: Lives with: lives with their spouse Lives in: House/apartment Stairs: 4 about home Has following equipment at home: Environmental Consultant - 4 wheeled, Shower bench, Grab bars, and modified vehicle with hand controls, scooter  PLOF: Independent with community mobility with device  PATIENT GOALS: improve balance; walk better, be able to stand and cook  OBJECTIVE:  Note: Objective measures were completed at Evaluation unless otherwise noted.  COGNITION: Overall cognitive status: Within functional limits for tasks assessed   SENSATION: impaired  COORDINATION: LE impaired  MUSCLE TONE: LE impaired  MUSCLE LENGTH: hyper knees into ext  DTRs: TBT  POSTURE: anterior pelvic tilt, right pelvic obliquity, weight shift left, and bilateral hyperextension of knees  LOWER EXTREMITY ROM:     Wfl  LOWER EXTREMITY MMT:    MMT Right Eval Left Eval  Hip flexion 3- 3-  Hip extension    Hip abduction    Hip adduction    Hip internal rotation    Hip external rotation    Knee flexion 3 3  Knee extension 3 3  Ankle dorsiflexion 2 3  Ankle plantarflexion    Ankle inversion    Ankle eversion    (Blank rows = not tested)  BED MOBILITY:  indep  TRANSFERS: Sit to stand: Modified independence  Assistive device utilized: Environmental Consultant - 4 wheeled    heavy ue support   STAIRS: 4 steps occasionally used with hand rails indep GAIT: Findings: Gait Characteristics: Right hip hike, genu recurvatum- Right, genu recurvatum- Left, trendelenburg, and trunk flexed, Distance walked: 500 ft, Assistive device utilized:Walker - 4 wheeled, Level of assistance: Modified independence  FUNCTIONAL TESTS:  Timed up and go  (TUG): 46.46 2 MWT128 ft using rollator 4 stage balance: FT close supervision; semi tandem, tandem and SLS unable to complete  PATIENT SURVEYS:  tba                                                                                                                              TREATMENT  LUE in sitting prior to interventions: Vitals:   02/09/24 0941  BP: (!) 112/55  Pulse: 69   Lateral tilt board w/ unilateral UE support for crossbody squigz retrieval and overhead placement x1 round each side A/P tilt board w/ no UE support for ipsilateral squigz removal for self-perturbation and correction SBA-CGA A/P tilt board light BUE support on ballet bar for mini squats x15 Resisted STS 3x6 w/ prolonged recovery for motor recruitment between sets Step out holds (3 sec) alt LE x10 each side; progressed to no UE support  PATIENT EDUCATION: Education details:   Monitoring vitals.  Ice/heat on knee for pain at home. Person educated: Patient Education method: Explanation Education comprehension: verbalized understanding  HOME EXERCISE PROGRAM: Access Code: [email or web address] URL: [email or web address] Date: 02/05/2024 Prepared by: Daved Bull  Exercises - Corner Balance Feet Together With Eyes Open  -  1 x daily - 4 x weekly - 1 sets - 2-3 reps - 30 seconds hold - Standing Near Stance in [place] with Eyes Closed  - 1 x daily - 4 x weekly - 1 sets - 2-3 reps - 30 seconds hold - Heel Raises with Counter Support  - 1 x daily - 4 x weekly - 1-2 sets - 5-6 reps - Squat with Chair Touch  - 1 x daily - 4 x weekly - 2 sets - 10 reps - Side Stepping with Resistance at Thighs and Counter Support  - 1 x daily - 4 x weekly - 3 sets - 10 reps  GOALS: Goals reviewed with patient? Yes  SHORT TERM GOALS: Target date: 02/21/24  Pt will tolerate full aquatic sessions consistently without increase in pain and with improving function to demonstrate good toleration and effectiveness of intervention.   Baseline: Goal status: INITIAL    LONG TERM GOALS: Target date: 03/18/25  Pt will be indep with final Aquatic HEP for continued management of condition Baseline:  Goal status: INITIAL  2.  Pt will improve to at least 157ft  to demonstrate improved ability to ambulate into and out of MD offices/clinics. (MCD = 4ft) Baseline: 128 ft Goal status: INITIAL  3.  Pt will improve on Tug test to <or=  40s to demonstrate improvement in lower extremity function, mobility and decreased fall risk. Baseline: 46.46 Goal status: INITIAL  4.  Pt will report improved walking ability and balance with daily function. Baseline:  Goal status: INITIAL  5.  Pt will demonstrate mindfulness with posture for improved alignment Baseline:  Goal status: INITIAL    ASSESSMENT:  CLINICAL IMPRESSION: Focus of skilled PT session today on working on prolonged standing tolerance and static stability.  Her right hip demonstrates significant fatigue with activity often allowing the R knee to collapse into valgus positioning for functional transfers and static standing posture on compliant surfaces.  She does well progressing to no UE support for tasks and works hard to manage hyperextension statically.  This is harder to manage with ambulation even with Bioness.  She continues to benefit from skilled PT to address gait mechanics, improve dynamic muscle engagement, and optimize safe upright mobility w/ rollator.  Continue per POC to address impairments as listed and progress towards goals.  OBJECTIVE IMPAIRMENTS: Abnormal gait, decreased activity tolerance, decreased balance, decreased coordination, decreased endurance, decreased mobility, difficulty walking, decreased strength, impaired tone, and postural dysfunction.   ACTIVITY LIMITATIONS: carrying, lifting, bending, sitting, standing, squatting, stairs, transfers, and locomotion level  PARTICIPATION LIMITATIONS: meal prep, cleaning, laundry, shopping,  community activity, yard work, and cooking  PERSONAL FACTORS: Time since onset of injury/illness/exacerbation are also affecting patient's functional outcome.   REHAB POTENTIAL: Fair -good for stated goals  CLINICAL DECISION MAKING: Stable/uncomplicated  EVALUATION COMPLEXITY: Low  PLAN:  PT FREQUENCY: 1-2x/week  PT DURATION: 6 weeks  PLANNED INTERVENTIONS: [id number]- PT Re-evaluation, 97750- Physical Performance Testing, 97110-Therapeutic exercises, 97530- Therapeutic activity, [id number]- Neuromuscular re-education, 97535- Self Care, [id number]- Manual therapy, [id number]- Gait training, [phone]- Aquatic Therapy, [phone]- Ionotophoresis 4mg /ml Dexamethasone , 79439 (1-2 muscles), [id number] (3+ muscles)- Dry Needling, Patient/Family education, Balance training, Stair training, Taping, Joint mobilization, DME instructions, Cryotherapy, and Moist heat  PLAN FOR NEXT SESSION: aquatics: strengthening; balance retraining, gait HEP  Aquatic HEP/ai chi; standing tolerance and BLE strength, leg press, bilateral stance training/static balance  Daved Bull, PT, DPT

## 2024-02-11 ENCOUNTER — Encounter: Payer: Self-pay | Admitting: Physical Therapy

## 2024-02-11 ENCOUNTER — Ambulatory Visit: Payer: Self-pay | Admitting: Physical Therapy

## 2024-02-11 DIAGNOSIS — M6281 Muscle weakness (generalized): Secondary | ICD-10-CM | POA: Diagnosis not present

## 2024-02-11 DIAGNOSIS — R2681 Unsteadiness on feet: Secondary | ICD-10-CM | POA: Diagnosis not present

## 2024-02-11 DIAGNOSIS — R262 Difficulty in walking, not elsewhere classified: Secondary | ICD-10-CM | POA: Diagnosis not present

## 2024-02-11 DIAGNOSIS — S32592D Other specified fracture of left pubis, subsequent encounter for fracture with routine healing: Secondary | ICD-10-CM | POA: Diagnosis not present

## 2024-02-11 DIAGNOSIS — S32591D Other specified fracture of right pubis, subsequent encounter for fracture with routine healing: Secondary | ICD-10-CM | POA: Diagnosis not present

## 2024-02-11 NOTE — Therapy (Signed)
 OUTPATIENT PHYSICAL THERAPY NEURO TREATMENT   Patient Name: Kayla [name] MRN: [id number] DOB:[birth year], 73 y.o., female Today's Date: 02/11/2024   PCP: Debby FREDRIK Molt MD REFERRING PROVIDER: Murl Cough, MD   END OF SESSION:  PT End of Session - 02/11/24 0849     Visit Number 4    Number of Visits 13   12 + eval   Date for Recertification  03/18/24    Authorization Type mcr    Progress Note Due on Visit 10    PT Start Time 0845    PT Stop Time 0928    PT Time Calculation (min) 43 min    Equipment Utilized During Treatment Gait belt    Activity Tolerance Patient tolerated treatment well    Behavior During Therapy WFL for tasks assessed/performed          Past Medical History:  Diagnosis Date   Arthritis    oa   Asthma    Bilateral vocal cord paralysis    Bronchiectasis (HCC)    Cervical cancer (HCC) 2005   Depression    GERD (gastroesophageal reflux disease)    Hiatal hernia    Hypothyroidism    Immunodeficiency    Gets IVIG infusions   Insomnia    Multiple sclerosis 1996   Osteoporosis    Pneumonia 2024, 2014, 1992   Urine retention    Past Surgical History:  Procedure Laterality Date   ABDOMINAL HYSTERECTOMY  03/25/2003   for cervical cancer, complete   BRONCHIAL BIOPSY  10/26/2020   Procedure: BRONCHIAL BIOPSIES;  Surgeon: Brenna Adine CROME, DO;  Location: [facility];  Service: Pulmonary;;   BRONCHIAL BRUSHINGS  10/26/2020   Procedure: BRONCHIAL BRUSHINGS;  Surgeon: Brenna Adine CROME, DO;  Location: [facility];  Service: Pulmonary;;   BRONCHIAL WASHINGS  10/26/2020   Procedure: BRONCHIAL WASHINGS;  Surgeon: Brenna Adine CROME, DO;  Location: [facility];  Service: Pulmonary;;   BRONCHIAL WASHINGS  08/19/2022   Procedure: BRONCHIAL WASHINGS;  Surgeon: Brenna Adine CROME, DO;  Location: [facility];  Service: Cardiopulmonary;;   CATARACT EXTRACTION, BILATERAL Bilateral    lens for cataracts   CONVERSION TO TOTAL HIP Right 09/18/2015    Procedure: CONVERSION OF RIGHT HEMI ARTHROPLASTY TO TOTAL HIP ARTHROPLASTY ACETABULAR REVISON ;  Surgeon: Cough Car, MD;  Location: [facility];  Service: Orthopedics;  Laterality: Right;   HIP ARTHROPLASTY Right 03/01/2013   Procedure: ARTHROPLASTY BIPOLAR HIP;  Surgeon: Cough JONETTA Car, MD;  Location: [facility];  Service: Orthopedics;  Laterality: Right;   HIP CLOSED REDUCTION Right 03/12/2013   Procedure: CLOSED REDUCTION HIP;  Surgeon: Dempsey LULLA Moan, MD;  Location: [facility];  Service: Orthopedics;  Laterality: Right;   HIP CLOSED REDUCTION Right 03/19/2013   Procedure: CLOSED REDUCTION HIP;  Surgeon: Reyes JAYSON Billing, MD;  Location: [facility];  Service: Orthopedics;  Laterality: Right;   HIP CLOSED REDUCTION Right 11/10/2015   Procedure: CLOSED MANIPULATION HIP;  Surgeon: Redell Shoals, MD;  Location: [facility];  Service: Orthopedics;  Laterality: Right;   HIP CLOSED REDUCTION Right 06/05/2017   Procedure: CLOSED REDUCTION HIP;  Surgeon: Heide Ingle, MD;  Location: [facility];  Service: Orthopedics;  Laterality: Right;   SMALL INTESTINE SURGERY     TEE WITHOUT CARDIOVERSION N/A 09/22/2022   Procedure: TRANSESOPHAGEAL ECHOCARDIOGRAM;  Surgeon: Okey Vina LULLA, MD;  Location: [facility];  Service: Cardiovascular;  Laterality: N/A;   TOTAL HIP REVISION Right 06/06/2017   Procedure: TOTAL HIP REVISION;  Surgeon: Heide Ingle, MD;  Location: [facility];  Service: Orthopedics;  Laterality: Right;   VESICOVAGINAL FISTULA CLOSURE W/ TAH  03/25/2003   VIDEO BRONCHOSCOPY N/A 10/26/2020   Procedure: VIDEO BRONCHOSCOPY WITH FLUORO;  Surgeon: Brenna Adine CROME, DO;  Location: [facility];  Service: Pulmonary;  Laterality: N/A;   VIDEO BRONCHOSCOPY Left 08/19/2022   Procedure: VIDEO BRONCHOSCOPY WITH FLUORO; WITH BAL;  Surgeon: Brenna Adine CROME, DO;  Location: [facility];  Service: Cardiopulmonary;  Laterality: Left;   Patient Active Problem List   Diagnosis Date Noted   Neuropathy 11/14/2022   Mild protein  malnutrition 10/18/2022   Chronic respiratory failure with hypoxia (HCC) 09/29/2022   Hypogammaglobulinemia 09/22/2022   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) 07/08/2022   GERD (gastroesophageal reflux disease) 06/20/2022   Chronic obstructive pulmonary disease (HCC) 09/24/2021   Pulmonary mycobacterial infection (HCC) 09/24/2021   Bilateral vocal cord paralysis 09/24/2021   UTI (urinary tract infection) 05/24/2021   COPD (chronic obstructive pulmonary disease) (HCC)    Iron deficiency anemia secondary to inadequate dietary iron intake 12/20/2020   Purple toe syndrome of both feet (HCC) 07/26/2020   Cough variant asthma vs vcd  related to MS  11/10/2016   Positional sleep apnea 09/10/2016   Periodic limb movements of sleep 09/10/2016   OSA on CPAP 02/08/2016   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/21/2013   Constipation 02/26/2013   Hyponatremia 05/20/2012   Post-menopausal osteoporosis 09/17/2007   Hypothyroidism 04/01/2007   Multiple sclerosis 04/01/2007     ONSET DATE: chronic  REFERRING DIAG:  G35 (ICD-10-CM) - Multiple sclerosis  G82.50 (ICD-10-CM) - Quadriplegia, unspecified    THERAPY DIAG:  Muscle weakness (generalized)  Unsteadiness on feet  Difficulty in walking, not elsewhere classified  Rationale for Evaluation and Treatment: Rehabilitation  SUBJECTIVE:                                                                                                                                                                                             SUBJECTIVE STATEMENT: Pt reports her right knee to right hip is still sore and feels like it could give out, but she is unsure why.  She takes a break from her [employer] unless she is out in the community.  She denies falls or other acute issues. Pt accompanied by: self  PERTINENT HISTORY: MS   PAIN:  Are you having pain? Yes: NPRS scale: 4/10 Pain location: R knee up thigh Pain description: intermittent;  sore Aggravating factors: walking - feels like it might give out Relieving factors: rest  PRECAUTIONS: Fall  RED FLAGS: None   WEIGHT BEARING RESTRICTIONS: No  FALLS: Has patient  fallen in last 6 months? No  LIVING ENVIRONMENT: Lives with: lives with their spouse Lives in: House/apartment Stairs: 4 about home Has following equipment at home: Environmental Consultant - 4 wheeled, Shower bench, Grab bars, and modified vehicle with hand controls, scooter  PLOF: Independent with community mobility with device  PATIENT GOALS: improve balance; walk better, be able to stand and cook  OBJECTIVE:  Note: Objective measures were completed at Evaluation unless otherwise noted.  COGNITION: Overall cognitive status: Within functional limits for tasks assessed   SENSATION: impaired  COORDINATION: LE impaired  MUSCLE TONE: LE impaired  MUSCLE LENGTH: hyper knees into ext  DTRs: TBT  POSTURE: anterior pelvic tilt, right pelvic obliquity, weight shift left, and bilateral hyperextension of knees  LOWER EXTREMITY ROM:     Wfl  LOWER EXTREMITY MMT:    MMT Right Eval Left Eval  Hip flexion 3- 3-  Hip extension    Hip abduction    Hip adduction    Hip internal rotation    Hip external rotation    Knee flexion 3 3  Knee extension 3 3  Ankle dorsiflexion 2 3  Ankle plantarflexion    Ankle inversion    Ankle eversion    (Blank rows = not tested)  BED MOBILITY:  indep  TRANSFERS: Sit to stand: Modified independence  Assistive device utilized: Environmental Consultant - 4 wheeled    heavy ue support   STAIRS: 4 steps occasionally used with hand rails indep GAIT: Findings: Gait Characteristics: Right hip hike, genu recurvatum- Right, genu recurvatum- Left, trendelenburg, and trunk flexed, Distance walked: 500 ft, Assistive device utilized:Walker - 4 wheeled, Level of assistance: Modified independence  FUNCTIONAL TESTS:  Timed up and go (TUG): 46.46 2 MWT128 ft using rollator 4 stage balance: FT close  supervision; semi tandem, tandem and SLS unable to complete  PATIENT SURVEYS:  tba                                                                                                                              TREATMENT  Aquatic therapy at [facility] - pool temperature 92 degrees   Patient seen for aquatic therapy today.  Treatment took place in water  3.6-4.8 feet deep depending upon activity.  Patient entered and exited the pool via stairs using Bil rails and reciprocal pattern at modI level.   Exercises: Water  walking warmup:  4x18 ft forward > backward > laterally  Ai Chi postures 1-10; x10 reps each (applied bilaterally when applicable). Pt maintains shallow squat throughout due to imbalance.  Pt demonstrates increased toe gripping to maintain stability.  Patient requires buoyancy of the water  for support for reduced fall risk with gait training and balance exercises with SBA, return demo and verbal cuing support. Exercises able to be performed safely in water  without the risk of fall compared to those same exercises performed on land; viscosity of water  needed for resistance for strengthening. Current of water  provides perturbations for challenging static  and dynamic balance.    PATIENT EDUCATION: Education details:   Monitoring vitals.  Ice/heat on knee for pain at home.  Aquatic rationale.  Using water  perturbations and varied BOS to warm up before her water  aerobics classes and to practice static balance in the pool.  Copy of Ai Chi - practice postures 1-10 as able and desired. Person educated: Patient Education method: Explanation Education comprehension: verbalized understanding  HOME EXERCISE PROGRAM: Access Code: [email or web address] URL: [email or web address] Date: 02/05/2024 Prepared by: Daved Bull  Exercises - Corner Balance Feet Together With Eyes Open  - 1 x daily - 4 x weekly - 1 sets - 2-3 reps - 30 seconds hold - Standing Near Stance in [place] with Eyes  Closed  - 1 x daily - 4 x weekly - 1 sets - 2-3 reps - 30 seconds hold - Heel Raises with Counter Support  - 1 x daily - 4 x weekly - 1-2 sets - 5-6 reps - Squat with Chair Touch  - 1 x daily - 4 x weekly - 2 sets - 10 reps - Side Stepping with Resistance at Thighs and Counter Support  - 1 x daily - 4 x weekly - 3 sets - 10 reps  GOALS: Goals reviewed with patient? Yes  SHORT TERM GOALS: Target date: 02/21/24  Pt will tolerate full aquatic sessions consistently without increase in pain and with improving function to demonstrate good toleration and effectiveness of intervention.  Baseline: Goal status: INITIAL    LONG TERM GOALS: Target date: 03/18/25  Pt will be indep with final Aquatic HEP for continued management of condition Baseline:  Goal status: INITIAL  2.  Pt will improve to at least 159ft  to demonstrate improved ability to ambulate into and out of MD offices/clinics. (MCD = 59ft) Baseline: 128 ft Goal status: INITIAL  3.  Pt will improve on Tug test to <or=  40s to demonstrate improvement in lower extremity function, mobility and decreased fall risk. Baseline: 46.46 Goal status: INITIAL  4.  Pt will report improved walking ability and balance with daily function. Baseline:  Goal status: INITIAL  5.  Pt will demonstrate mindfulness with posture for improved alignment Baseline:  Goal status: INITIAL    ASSESSMENT:  CLINICAL IMPRESSION: Pt seen for initial aquatic visit this POC.  She demonstrates great compensatory balance strategies including toe grip with increased repetition and intensity of movements.  Reviewed ai chi postures 1-10 with plan to provide copy to patient to supplement water  aerobics program in the future.  She stands to benefit from further skilled PT in this setting to improve stability and supplement land based strength regimen. Continue per POC to address impairments as listed and progress towards goals.  OBJECTIVE IMPAIRMENTS: Abnormal  gait, decreased activity tolerance, decreased balance, decreased coordination, decreased endurance, decreased mobility, difficulty walking, decreased strength, impaired tone, and postural dysfunction.   ACTIVITY LIMITATIONS: carrying, lifting, bending, sitting, standing, squatting, stairs, transfers, and locomotion level  PARTICIPATION LIMITATIONS: meal prep, cleaning, laundry, shopping, community activity, yard work, and cooking  PERSONAL FACTORS: Time since onset of injury/illness/exacerbation are also affecting patient's functional outcome.   REHAB POTENTIAL: Fair -good for stated goals  CLINICAL DECISION MAKING: Stable/uncomplicated  EVALUATION COMPLEXITY: Low  PLAN:  PT FREQUENCY: 1-2x/week  PT DURATION: 6 weeks  PLANNED INTERVENTIONS: 97164- PT Re-evaluation, 97750- Physical Performance Testing, 97110-Therapeutic exercises, 97530- Therapeutic activity, [id number]- Neuromuscular re-education, 97535- Self Care, [id number]- Manual therapy, [id number]- Gait training, [phone]- Aquatic Therapy, [phone]- Ionotophoresis 4mg /ml Dexamethasone , 79439 (1-2  muscles), 20561 (3+ muscles)- Dry Needling, Patient/Family education, Balance training, Stair training, Taping, Joint mobilization, DME instructions, Cryotherapy, and Moist heat  PLAN FOR NEXT SESSION: aquatics: strengthening; balance retraining, gait HEP  Aquatic HEP; standing tolerance and BLE strength, leg press, bilateral stance training/static balance; try SLS - maybe ai chi 11-16 modified?  Daved Bull, PT, DPT

## 2024-02-13 ENCOUNTER — Other Ambulatory Visit: Payer: Self-pay | Admitting: Internal Medicine

## 2024-02-13 DIAGNOSIS — F3341 Major depressive disorder, recurrent, in partial remission: Secondary | ICD-10-CM

## 2024-02-15 ENCOUNTER — Encounter: Payer: Self-pay | Admitting: Internal Medicine

## 2024-02-15 ENCOUNTER — Other Ambulatory Visit: Payer: Self-pay

## 2024-02-15 DIAGNOSIS — D508 Other iron deficiency anemias: Secondary | ICD-10-CM

## 2024-02-15 MED ORDER — ACCRUFER 30 MG PO CAPS: 1.0000 | ORAL_CAPSULE | Freq: Two times a day (BID) | ORAL | 1 refills | Status: AC

## 2024-02-23 ENCOUNTER — Ambulatory Visit: Payer: Self-pay | Attending: Internal Medicine | Admitting: Physical Therapy

## 2024-02-23 ENCOUNTER — Encounter: Payer: Self-pay | Admitting: Physical Therapy

## 2024-02-23 DIAGNOSIS — R2681 Unsteadiness on feet: Secondary | ICD-10-CM | POA: Diagnosis present

## 2024-02-23 DIAGNOSIS — M6281 Muscle weakness (generalized): Secondary | ICD-10-CM | POA: Insufficient documentation

## 2024-02-23 DIAGNOSIS — R262 Difficulty in walking, not elsewhere classified: Secondary | ICD-10-CM | POA: Diagnosis present

## 2024-02-23 NOTE — Therapy (Signed)
 OUTPATIENT PHYSICAL THERAPY NEURO TREATMENT   Patient Name: Kayla [name] MRN: [id number] DOB:02-Nov-1950, 73 y.o., female Today's Date: 02/23/2024   PCP: Kayla FREDRIK Molt MD REFERRING PROVIDER: Murl Cough, MD   END OF SESSION:  PT End of Session - 02/23/24 1338     Visit Number 5    Number of Visits 13   12 + eval   Date for Recertification  03/18/24    Authorization Type mcr    Progress Note Due on Visit 10    PT Start Time 1326    PT Stop Time 1407    PT Time Calculation (min) 41 min    Equipment Utilized During Treatment Gait belt    Activity Tolerance Patient tolerated treatment well    Behavior During Therapy WFL for tasks assessed/performed          Past Medical History:  Diagnosis Date   Arthritis    oa   Asthma    Bilateral vocal cord paralysis    Bronchiectasis (HCC)    Cervical cancer (HCC) 2005   Depression    GERD (gastroesophageal reflux disease)    Hiatal hernia    Hypothyroidism    Immunodeficiency    Gets IVIG infusions   Insomnia    Multiple sclerosis 1996   Osteoporosis    Pneumonia 2024, 2014, 1992   Urine retention    Past Surgical History:  Procedure Laterality Date   ABDOMINAL HYSTERECTOMY  03/25/2003   for cervical cancer, complete   BRONCHIAL BIOPSY  10/26/2020   Procedure: BRONCHIAL BIOPSIES;  Surgeon: Kayla Adine CROME, DO;  Location: [facility];  Service: Pulmonary;;   BRONCHIAL BRUSHINGS  10/26/2020   Procedure: BRONCHIAL BRUSHINGS;  Surgeon: Kayla Adine CROME, DO;  Location: [facility];  Service: Pulmonary;;   BRONCHIAL WASHINGS  10/26/2020   Procedure: BRONCHIAL WASHINGS;  Surgeon: Kayla Adine CROME, DO;  Location: [facility];  Service: Pulmonary;;   BRONCHIAL WASHINGS  08/19/2022   Procedure: BRONCHIAL WASHINGS;  Surgeon: Kayla Adine CROME, DO;  Location: [facility];  Service: Cardiopulmonary;;   CATARACT EXTRACTION, BILATERAL Bilateral    lens for cataracts   CONVERSION TO TOTAL HIP Right 09/18/2015   Procedure:  CONVERSION OF RIGHT HEMI ARTHROPLASTY TO TOTAL HIP ARTHROPLASTY ACETABULAR REVISON ;  Surgeon: [name] Car, MD;  Location: [facility];  Service: Orthopedics;  Laterality: Right;   HIP ARTHROPLASTY Right 03/01/2013   Procedure: ARTHROPLASTY BIPOLAR HIP;  Surgeon: [name] JONETTA Car, MD;  Location: [facility];  Service: Orthopedics;  Laterality: Right;   HIP CLOSED REDUCTION Right 03/12/2013   Procedure: CLOSED REDUCTION HIP;  Surgeon: Kayla [name] Moan, MD;  Location: [facility];  Service: Orthopedics;  Laterality: Right;   HIP CLOSED REDUCTION Right 03/19/2013   Procedure: CLOSED REDUCTION HIP;  Surgeon: Kayla JAYSON Billing, MD;  Location: [facility];  Service: Orthopedics;  Laterality: Right;   HIP CLOSED REDUCTION Right 11/10/2015   Procedure: CLOSED MANIPULATION HIP;  Surgeon: Kayla Shoals, MD;  Location: [facility];  Service: Orthopedics;  Laterality: Right;   HIP CLOSED REDUCTION Right 06/05/2017   Procedure: CLOSED REDUCTION HIP;  Surgeon: Kayla Ingle, MD;  Location: [facility];  Service: Orthopedics;  Laterality: Right;   SMALL INTESTINE SURGERY     TEE WITHOUT CARDIOVERSION N/A 09/22/2022   Procedure: TRANSESOPHAGEAL ECHOCARDIOGRAM;  Surgeon: Kayla Vina LULLA, MD;  Location: [facility];  Service: Cardiovascular;  Laterality: N/A;   TOTAL HIP REVISION Right 06/06/2017   Procedure: TOTAL HIP REVISION;  Surgeon: Kayla Ingle, MD;  Location: [facility];  Service: Orthopedics;  Laterality: Right;   VESICOVAGINAL FISTULA CLOSURE W/ TAH  03/25/2003   VIDEO BRONCHOSCOPY N/A 10/26/2020   Procedure: VIDEO BRONCHOSCOPY WITH FLUORO;  Surgeon: Kayla Adine CROME, DO;  Location: [facility];  Service: Pulmonary;  Laterality: N/A;   VIDEO BRONCHOSCOPY Left 08/19/2022   Procedure: VIDEO BRONCHOSCOPY WITH FLUORO; WITH BAL;  Surgeon: Kayla Adine CROME, DO;  Location: [facility];  Service: Cardiopulmonary;  Laterality: Left;   Patient Active Problem List   Diagnosis Date Noted   Neuropathy 11/14/2022   Mild protein malnutrition  10/18/2022   Chronic respiratory failure with hypoxia (HCC) 09/29/2022   Hypogammaglobulinemia 09/22/2022   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) 07/08/2022   GERD (gastroesophageal reflux disease) 06/20/2022   Chronic obstructive pulmonary disease (HCC) 09/24/2021   Pulmonary mycobacterial infection (HCC) 09/24/2021   Bilateral vocal cord paralysis 09/24/2021   UTI (urinary tract infection) 05/24/2021   COPD (chronic obstructive pulmonary disease) (HCC)    Iron deficiency anemia secondary to inadequate dietary iron intake 12/20/2020   Purple toe syndrome of both feet (HCC) 07/26/2020   [name] variant asthma vs vcd  related to MS  11/10/2016   Positional sleep apnea 09/10/2016   Periodic limb movements of sleep 09/10/2016   OSA on CPAP 02/08/2016   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/21/2013   Constipation 02/26/2013   Hyponatremia 05/20/2012   Post-menopausal osteoporosis 09/17/2007   Hypothyroidism 04/01/2007   Multiple sclerosis 04/01/2007     ONSET DATE: chronic  REFERRING DIAG:  G35 (ICD-10-CM) - Multiple sclerosis  G82.50 (ICD-10-CM) - Quadriplegia, unspecified    THERAPY DIAG:  Muscle weakness (generalized)  Unsteadiness on feet  Difficulty in walking, not elsewhere classified  Rationale for Evaluation and Treatment: Rehabilitation  SUBJECTIVE:                                                                                                                                                                                             SUBJECTIVE STATEMENT: Pt reports both hips are sore today and she is moving slower due to the cold.  She was unable to make it to the pool Sunday due to an upset stomach.  She denies falls or other acute issues. Pt accompanied by: self  PERTINENT HISTORY: MS   PAIN:  Are you having pain? Yes: NPRS scale: 4/10 Pain location: bilateral hips esp right anterior hip Pain description: sore Aggravating factors: walking -  feels like it might give out Relieving factors: rest  PRECAUTIONS: Fall  RED FLAGS: None   WEIGHT BEARING RESTRICTIONS: No  FALLS: Has patient fallen in last 6 months?  No  LIVING ENVIRONMENT: Lives with: lives with their spouse Lives in: House/apartment Stairs: 4 about home Has following equipment at home: Environmental [employer]ant - 4 wheeled, Shower bench, Grab bars, and modified vehicle with hand controls, scooter  PLOF: Independent with community mobility with device  PATIENT GOALS: improve balance; walk better, be able to stand and cook  OBJECTIVE:  Note: Objective measures were completed at Evaluation unless otherwise noted.  COGNITION: Overall cognitive status: Within functional limits for tasks assessed   SENSATION: impaired  COORDINATION: LE impaired  MUSCLE TONE: LE impaired  MUSCLE LENGTH: hyper knees into ext  DTRs: TBT  POSTURE: anterior pelvic tilt, right pelvic obliquity, weight shift left, and bilateral hyperextension of knees  LOWER EXTREMITY ROM:     Wfl  LOWER EXTREMITY MMT:    MMT Right Eval Left Eval  Hip flexion 3- 3-  Hip extension    Hip abduction    Hip adduction    Hip internal rotation    Hip external rotation    Knee flexion 3 3  Knee extension 3 3  Ankle dorsiflexion 2 3  Ankle plantarflexion    Ankle inversion    Ankle eversion    (Blank rows = not tested)  BED MOBILITY:  indep  TRANSFERS: Sit to stand: Modified independence  Assistive device utilized: Environmental Consultant - 4 wheeled    heavy ue support   STAIRS: 4 steps occasionally used with hand rails indep GAIT: Findings: Gait Characteristics: Right hip hike, genu recurvatum- Right, genu recurvatum- Left, trendelenburg, and trunk flexed, Distance walked: 500 ft, Assistive device utilized:Walker - 4 wheeled, Level of assistance: Modified independence  FUNCTIONAL TESTS:  Timed up and go (TUG): 46.46 2 MWT128 ft using rollator 4 stage balance: FT close supervision; semi tandem, tandem and  SLS unable to complete  PATIENT SURVEYS:  tba                                                                                                                              TREATMENT  SBA for slow ambulation w/ 2WW into gym requiring single standing recovery due to discomfort in right anterior hip > helped pt minA transition to supine and placed MHP under hips and low back for pain relief  -LTR 2x20 -SAQ 2x15 each LE (added 3lb ankle wt for second set) -3lb march over 4 hurdle in supine to fatigue each side (~10 reps each)  -Shallow butterfly stretch 2x45 sec -Supine figure-4 stretch 2x45 sec each LE; R hip profound tightness requiring supine positioning of supporting LE -Supine hip flexor hang x1 minute each LE  PATIENT EDUCATION: Education details:   Monitoring vitals at home.  Ice/heat on knee for pain at home.  Continue Ai Chi as able. Person educated: Patient Education method: Explanation Education comprehension: verbalized understanding  HOME EXERCISE PROGRAM: Access Code: [email or web address] URL: [email or web address] Date: 02/05/2024 Prepared by: Daved Bull  Exercises - Corner Balance Feet Together With Eyes Open  - 1  x daily - 4 x weekly - 1 sets - 2-3 reps - 30 seconds hold - Standing Near Stance in Independence with Eyes Closed  - 1 x daily - 4 x weekly - 1 sets - 2-3 reps - 30 seconds hold - Heel Raises with Counter Support  - 1 x daily - 4 x weekly - 1-2 sets - 5-6 reps - Squat with Chair Touch  - 1 x daily - 4 x weekly - 2 sets - 10 reps - Side Stepping with Resistance at Thighs and Counter Support  - 1 x daily - 4 x weekly - 3 sets - 10 reps  GOALS: Goals reviewed with patient? Yes  SHORT TERM GOALS: Target date: 02/21/24  Pt will tolerate full aquatic sessions consistently without increase in pain and with improving function to demonstrate good toleration and effectiveness of intervention.  Baseline: Goal status: INITIAL    LONG TERM GOALS: Target  date: 03/18/25  Pt will be indep with final Aquatic HEP for continued management of condition Baseline:  Goal status: INITIAL  2.  Pt will improve to at least 14ft  to demonstrate improved ability to ambulate into and out of MD offices/clinics. (MCD = 68ft) Baseline: 128 ft Goal status: INITIAL  3.  Pt will improve on Tug test to <or=  40s to demonstrate improvement in lower extremity function, mobility and decreased fall risk. Baseline: 46.46 Goal status: INITIAL  4.  Pt will report improved walking ability and balance with daily function. Baseline:  Goal status: INITIAL  5.  Pt will demonstrate mindfulness with posture for improved alignment Baseline:  Goal status: INITIAL    ASSESSMENT:  CLINICAL IMPRESSION: Gentle focus this session as pt expressing increased mobility deficits due to weather and more bilateral hip discomfort.  She has positive response to focus on localized heat and stretching w/ light strength component to support stability.  Able to target anterior area of discomfort with stretches today and pt may benefit from variable positions for review in future sessions.  Will continue per POC w/ focus on progression of strengthening and functional mobility.  OBJECTIVE IMPAIRMENTS: Abnormal gait, decreased activity tolerance, decreased balance, decreased coordination, decreased endurance, decreased mobility, difficulty walking, decreased strength, impaired tone, and postural dysfunction.   ACTIVITY LIMITATIONS: carrying, lifting, bending, sitting, standing, squatting, stairs, transfers, and locomotion level  PARTICIPATION LIMITATIONS: meal prep, cleaning, laundry, shopping, community activity, yard work, and cooking  PERSONAL FACTORS: Time since onset of injury/illness/exacerbation are also affecting patient's functional outcome.   REHAB POTENTIAL: Fair -good for stated goals  CLINICAL DECISION MAKING: Stable/uncomplicated  EVALUATION COMPLEXITY:  Low  PLAN:  PT FREQUENCY: 1-2x/week  PT DURATION: 6 weeks  PLANNED INTERVENTIONS: 97164- PT Re-evaluation, 97750- Physical Performance Testing, 97110-Therapeutic exercises, 97530- Therapeutic activity, [id number]- Neuromuscular re-education, 97535- Self Care, [id number]- Manual therapy, [phone]- Gait training, [phone]- Aquatic Therapy, [phone]- Ionotophoresis 4mg /ml Dexamethasone , 20560 (1-2 muscles), 20561 (3+ muscles)- Dry Needling, Patient/Family education, Balance training, Stair training, Taping, Joint mobilization, DME instructions, Cryotherapy, and Moist heat  PLAN FOR NEXT SESSION: aquatics: strengthening; balance retraining, gait HEP  Aquatic HEP; standing tolerance and BLE strength, leg press, bilateral stance training/static balance; try SLS - maybe ai chi 11-16 modified?  Daved Bull, PT, DPT

## 2024-02-25 ENCOUNTER — Ambulatory Visit: Payer: Self-pay | Admitting: Physical Therapy

## 2024-02-25 ENCOUNTER — Encounter: Payer: Self-pay | Admitting: Physical Therapy

## 2024-02-25 DIAGNOSIS — R262 Difficulty in walking, not elsewhere classified: Secondary | ICD-10-CM

## 2024-02-25 DIAGNOSIS — R2681 Unsteadiness on feet: Secondary | ICD-10-CM

## 2024-02-25 DIAGNOSIS — M6281 Muscle weakness (generalized): Secondary | ICD-10-CM | POA: Diagnosis not present

## 2024-02-25 NOTE — Therapy (Signed)
 OUTPATIENT PHYSICAL THERAPY NEURO TREATMENT   Patient Name: MALESSA [name] MRN: [id number] DOB:1950-04-10, 73 y.o., female Today's Date: 02/25/2024   PCP: Debby FREDRIK Molt MD REFERRING PROVIDER: Murl Cough, MD   END OF SESSION:  PT End of Session - 02/25/24 0940     Visit Number 6    Number of Visits 13   12 + eval   Date for Recertification  03/18/24    Authorization Type mcr    Progress Note Due on Visit 10    PT Start Time 0932    PT Stop Time 1015    PT Time Calculation (min) 43 min    Equipment Utilized During Treatment Other (comment)   aquatic devices as needed for safety and challenge   Activity Tolerance Patient tolerated treatment well    Behavior During Therapy WFL for tasks assessed/performed          Past Medical History:  Diagnosis Date   Arthritis    oa   Asthma    Bilateral vocal cord paralysis    Bronchiectasis (HCC)    Cervical cancer (HCC) 2005   Depression    GERD (gastroesophageal reflux disease)    Hiatal hernia    Hypothyroidism    Immunodeficiency    Gets IVIG infusions   Insomnia    Multiple sclerosis 1996   Osteoporosis    Pneumonia 2024, 2014, 1992   Urine retention    Past Surgical History:  Procedure Laterality Date   ABDOMINAL HYSTERECTOMY  03/25/2003   for cervical cancer, complete   BRONCHIAL BIOPSY  10/26/2020   Procedure: BRONCHIAL BIOPSIES;  Surgeon: Brenna Adine CROME, DO;  Location: [facility];  Service: Pulmonary;;   BRONCHIAL BRUSHINGS  10/26/2020   Procedure: BRONCHIAL BRUSHINGS;  Surgeon: Brenna Adine CROME, DO;  Location: [facility];  Service: Pulmonary;;   BRONCHIAL WASHINGS  10/26/2020   Procedure: BRONCHIAL WASHINGS;  Surgeon: Brenna Adine CROME, DO;  Location: [facility];  Service: Pulmonary;;   BRONCHIAL WASHINGS  08/19/2022   Procedure: BRONCHIAL WASHINGS;  Surgeon: Brenna Adine CROME, DO;  Location: [facility];  Service: Cardiopulmonary;;   CATARACT EXTRACTION, BILATERAL Bilateral    lens for cataracts    CONVERSION TO TOTAL HIP Right 09/18/2015   Procedure: CONVERSION OF RIGHT HEMI ARTHROPLASTY TO TOTAL HIP ARTHROPLASTY ACETABULAR REVISON ;  Surgeon: Cough Car, MD;  Location: [facility];  Service: Orthopedics;  Laterality: Right;   HIP ARTHROPLASTY Right 03/01/2013   Procedure: ARTHROPLASTY BIPOLAR HIP;  Surgeon: Cough JONETTA Car, MD;  Location: [facility];  Service: Orthopedics;  Laterality: Right;   HIP CLOSED REDUCTION Right 03/12/2013   Procedure: CLOSED REDUCTION HIP;  Surgeon: Dempsey LULLA Moan, MD;  Location: [facility];  Service: Orthopedics;  Laterality: Right;   HIP CLOSED REDUCTION Right 03/19/2013   Procedure: CLOSED REDUCTION HIP;  Surgeon: Reyes JAYSON Billing, MD;  Location: [facility];  Service: Orthopedics;  Laterality: Right;   HIP CLOSED REDUCTION Right 11/10/2015   Procedure: CLOSED MANIPULATION HIP;  Surgeon: Redell Shoals, MD;  Location: [facility];  Service: Orthopedics;  Laterality: Right;   HIP CLOSED REDUCTION Right 06/05/2017   Procedure: CLOSED REDUCTION HIP;  Surgeon: Heide Ingle, MD;  Location: [facility];  Service: Orthopedics;  Laterality: Right;   SMALL INTESTINE SURGERY     TEE WITHOUT CARDIOVERSION N/A 09/22/2022   Procedure: TRANSESOPHAGEAL ECHOCARDIOGRAM;  Surgeon: Okey Vina LULLA, MD;  Location: [facility];  Service: Cardiovascular;  Laterality: N/A;   TOTAL HIP REVISION Right 06/06/2017  Procedure: TOTAL HIP REVISION;  Surgeon: Heide Ingle, MD;  Location: [facility];  Service: Orthopedics;  Laterality: Right;   VESICOVAGINAL FISTULA CLOSURE W/ TAH  03/25/2003   VIDEO BRONCHOSCOPY N/A 10/26/2020   Procedure: VIDEO BRONCHOSCOPY WITH FLUORO;  Surgeon: Brenna Adine CROME, DO;  Location: [facility];  Service: Pulmonary;  Laterality: N/A;   VIDEO BRONCHOSCOPY Left 08/19/2022   Procedure: VIDEO BRONCHOSCOPY WITH FLUORO; WITH BAL;  Surgeon: Brenna Adine CROME, DO;  Location: [facility];  Service: Cardiopulmonary;  Laterality: Left;   Patient Active Problem List   Diagnosis Date  Noted   Neuropathy 11/14/2022   Mild protein malnutrition 10/18/2022   Chronic respiratory failure with hypoxia (HCC) 09/29/2022   Hypogammaglobulinemia 09/22/2022   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) 07/08/2022   GERD (gastroesophageal reflux disease) 06/20/2022   Chronic obstructive pulmonary disease (HCC) 09/24/2021   Pulmonary mycobacterial infection (HCC) 09/24/2021   Bilateral vocal cord paralysis 09/24/2021   UTI (urinary tract infection) 05/24/2021   COPD (chronic obstructive pulmonary disease) (HCC)    Iron deficiency anemia secondary to inadequate dietary iron intake 12/20/2020   Purple toe syndrome of both feet (HCC) 07/26/2020   Cough variant asthma vs vcd  related to MS  11/10/2016   Positional sleep apnea 09/10/2016   Periodic limb movements of sleep 09/10/2016   OSA on CPAP 02/08/2016   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/21/2013   Constipation 02/26/2013   Hyponatremia 05/20/2012   Post-menopausal osteoporosis 09/17/2007   Hypothyroidism 04/01/2007   Multiple sclerosis 04/01/2007     ONSET DATE: chronic  REFERRING DIAG:  G35 (ICD-10-CM) - Multiple sclerosis  G82.50 (ICD-10-CM) - Quadriplegia, unspecified    THERAPY DIAG:  Muscle weakness (generalized)  Unsteadiness on feet  Difficulty in walking, not elsewhere classified  Rationale for Evaluation and Treatment: Rehabilitation  SUBJECTIVE:                                                                                                                                                                                             SUBJECTIVE STATEMENT: Pt reports more difficulty moving today and severely dry mouth.  She denies falls or other acute issues. Pt accompanied by: self  PERTINENT HISTORY: MS   PAIN:  Are you having pain? Yes: NPRS scale: 4/10 Pain location: bilateral hips esp right anterior hip Pain description: sore Aggravating factors: walking - feels like it might give  out Relieving factors: rest  PRECAUTIONS: Fall  RED FLAGS: None   WEIGHT BEARING RESTRICTIONS: No  FALLS: Has patient fallen in last 6 months? No  LIVING ENVIRONMENT: Lives with: lives with their spouse Lives in:  House/apartment Stairs: 4 about home Has following equipment at home: Vannie - 4 wheeled, Shower bench, Grab bars, and modified vehicle with hand controls, scooter  PLOF: Independent with community mobility with device  PATIENT GOALS: improve balance; walk better, be able to stand and cook  OBJECTIVE:  Note: Objective measures were completed at Evaluation unless otherwise noted.  COGNITION: Overall cognitive status: Within functional limits for tasks assessed   SENSATION: impaired  COORDINATION: LE impaired  MUSCLE TONE: LE impaired  MUSCLE LENGTH: hyper knees into ext  DTRs: TBT  POSTURE: anterior pelvic tilt, right pelvic obliquity, weight shift left, and bilateral hyperextension of knees  LOWER EXTREMITY ROM:     Wfl  LOWER EXTREMITY MMT:    MMT Right Eval Left Eval  Hip flexion 3- 3-  Hip extension    Hip abduction    Hip adduction    Hip internal rotation    Hip external rotation    Knee flexion 3 3  Knee extension 3 3  Ankle dorsiflexion 2 3  Ankle plantarflexion    Ankle inversion    Ankle eversion    (Blank rows = not tested)  BED MOBILITY:  indep  TRANSFERS: Sit to stand: Modified independence  Assistive device utilized: Environmental Consultant - 4 wheeled    heavy ue support   STAIRS: 4 steps occasionally used with hand rails indep GAIT: Findings: Gait Characteristics: Right hip hike, genu recurvatum- Right, genu recurvatum- Left, trendelenburg, and trunk flexed, Distance walked: 500 ft, Assistive device utilized:Walker - 4 wheeled, Level of assistance: Modified independence  FUNCTIONAL TESTS:  Timed up and go (TUG): 46.46 2 MWT128 ft using rollator 4 stage balance: FT close supervision; semi tandem, tandem and SLS unable to  complete  PATIENT SURVEYS:  tba                                                                                                                              TREATMENT  Aquatic therapy at [facility] - pool temperature 92 degrees   Patient seen for aquatic therapy today.  Treatment took place in water  3.6-4.8 feet deep depending upon activity.  Patient entered and exited the pool via stairs step to pattern using bil rails at SBA level (slower gait w/ more hyperextension w/o Bioness).   Exercises: -Water  walking warmup unsupported 4x18 ft forwards > backwards > laterally -Ai Chi postures 11-16  -Modified to incorporate reduced ROM and lift of contralateral LE as well as added unilateral UE support as needed  -Time spent stretching right toes and thigh due to cramping during activities  -x10; bilaterally when appropriate  -Reviewed Ai chi postures 2-4 w/ multicolor low resistance DB x10 each for increased core and postural challenge  Patient requires buoyancy of the water  for support for reduced fall risk with gait training and balance exercises with SBA support, verbal cues and return demonstration. Exercises able to be performed safely in water  without the risk of fall compared to  those same exercises performed on land; viscosity of water  needed for resistance for strengthening. Current of water  provides perturbations for challenging static and dynamic balance.   PATIENT EDUCATION: Education details:  Ice/heat for pain at home.  Try Ai Chi when able to return to the pool.  Trying to manage LE cramping w/ weight bearing and stretching.  [facility] member times for warm water  pool. Person educated: Patient Education method: Explanation Education comprehension: verbalized understanding  HOME EXERCISE PROGRAM: Access Code: [email or web address] URL: [email or web address] Date: 02/05/2024 Prepared by: Daved Bull  Exercises - Corner Balance Feet Together With Eyes Open  - 1 x daily -  4 x weekly - 1 sets - 2-3 reps - 30 seconds hold - Standing Near Stance in [place] with Eyes Closed  - 1 x daily - 4 x weekly - 1 sets - 2-3 reps - 30 seconds hold - Heel Raises with Counter Support  - 1 x daily - 4 x weekly - 1-2 sets - 5-6 reps - Squat with Chair Touch  - 1 x daily - 4 x weekly - 2 sets - 10 reps - Side Stepping with Resistance at Thighs and Counter Support  - 1 x daily - 4 x weekly - 3 sets - 10 reps  GOALS: Goals reviewed with patient? Yes  SHORT TERM GOALS: Target date: 02/21/24  Pt will tolerate full aquatic sessions consistently without increase in pain and with improving function to demonstrate good toleration and effectiveness of intervention.  Baseline: Goal status: INITIAL    LONG TERM GOALS: Target date: 03/18/25  Pt will be indep with final Aquatic HEP for continued management of condition Baseline:  Goal status: INITIAL  2.  Pt will improve to at least 137ft  to demonstrate improved ability to ambulate into and out of MD offices/clinics. (MCD = 15ft) Baseline: 128 ft Goal status: INITIAL  3.  Pt will improve on Tug test to <or=  40s to demonstrate improvement in lower extremity function, mobility and decreased fall risk. Baseline: 46.46 Goal status: INITIAL  4.  Pt will report improved walking ability and balance with daily function. Baseline:  Goal status: INITIAL  5.  Pt will demonstrate mindfulness with posture for improved alignment Baseline:  Goal status: INITIAL    ASSESSMENT:  CLINICAL IMPRESSION: Completed ai chi review this session with modifications for SLS.  Pt has some RLE cramping this date so utilized breaks to gently stretch great toe and quads to improve tolerance.  She has some weakness of the left hip noted w/ cross body reaching in L stance.  She is planning to return to pool environment between now and next visit to practice ai chi postures and provide feedback to therapist regarding needed corrections for improved  independence.  She would benefit from further work in SLS in the future.  Continue per POC.  OBJECTIVE IMPAIRMENTS: Abnormal gait, decreased activity tolerance, decreased balance, decreased coordination, decreased endurance, decreased mobility, difficulty walking, decreased strength, impaired tone, and postural dysfunction.   ACTIVITY LIMITATIONS: carrying, lifting, bending, sitting, standing, squatting, stairs, transfers, and locomotion level  PARTICIPATION LIMITATIONS: meal prep, cleaning, laundry, shopping, community activity, yard work, and cooking  PERSONAL FACTORS: Time since onset of injury/illness/exacerbation are also affecting patient's functional outcome.   REHAB POTENTIAL: Fair -good for stated goals  CLINICAL DECISION MAKING: Stable/uncomplicated  EVALUATION COMPLEXITY: Low  PLAN:  PT FREQUENCY: 1-2x/week  PT DURATION: 6 weeks  PLANNED INTERVENTIONS: 97164- PT Re-evaluation, 97750- Physical Performance Testing, 97110-Therapeutic exercises, 97530- Therapeutic activity,  02887- Neuromuscular re-education, [phone]- Self Care, 02859- Manual therapy, [id number]- Gait training, [phone]- Aquatic Therapy, [phone]- Ionotophoresis 4mg /ml Dexamethasone , 20560 (1-2 muscles), 20561 (3+ muscles)- Dry Needling, Patient/Family education, Balance training, Stair training, Taping, Joint mobilization, DME instructions, Cryotherapy, and Moist heat  PLAN FOR NEXT SESSION: aquatics: strengthening; balance retraining, gait HEP  Aquatic HEP; standing tolerance and BLE strength, leg press, bilateral stance training/static balance; SLS - falling star, modified tree pose, warrior poses, stair step to offset squat  Daved Bull, PT, DPT

## 2024-02-29 ENCOUNTER — Encounter: Payer: Self-pay | Admitting: Physical Therapy

## 2024-02-29 ENCOUNTER — Ambulatory Visit: Payer: Self-pay | Admitting: Physical Therapy

## 2024-02-29 DIAGNOSIS — M6281 Muscle weakness (generalized): Secondary | ICD-10-CM | POA: Diagnosis not present

## 2024-02-29 DIAGNOSIS — R262 Difficulty in walking, not elsewhere classified: Secondary | ICD-10-CM

## 2024-02-29 DIAGNOSIS — R2681 Unsteadiness on feet: Secondary | ICD-10-CM

## 2024-02-29 NOTE — Therapy (Signed)
 OUTPATIENT PHYSICAL THERAPY NEURO TREATMENT   Patient Name: Kayla [name] MRN: [id number] DOB:1950-08-16, 73 y.o., female Today's Date: 02/29/2024   PCP: Debby FREDRIK Molt MD REFERRING PROVIDER: Murl Cough, MD   END OF SESSION:  PT End of Session - 02/29/24 1500     Visit Number 7    Number of Visits 13   12 + eval   Date for Recertification  03/18/24    Authorization Type mcr    Progress Note Due on Visit 10    PT Start Time 1454    PT Stop Time 1532    PT Time Calculation (min) 38 min    Equipment Utilized During Treatment Gait belt    Activity Tolerance Patient tolerated treatment well    Behavior During Therapy WFL for tasks assessed/performed          Past Medical History:  Diagnosis Date   Arthritis    oa   Asthma    Bilateral vocal cord paralysis    Bronchiectasis (HCC)    Cervical cancer (HCC) 2005   Depression    GERD (gastroesophageal reflux disease)    Hiatal hernia    Hypothyroidism    Immunodeficiency    Gets IVIG infusions   Insomnia    Multiple sclerosis 1996   Osteoporosis    Pneumonia 2024, 2014, 1992   Urine retention    Past Surgical History:  Procedure Laterality Date   ABDOMINAL HYSTERECTOMY  03/25/2003   for cervical cancer, complete   BRONCHIAL BIOPSY  10/26/2020   Procedure: BRONCHIAL BIOPSIES;  Surgeon: Brenna Adine CROME, DO;  Location: [facility];  Service: Pulmonary;;   BRONCHIAL BRUSHINGS  10/26/2020   Procedure: BRONCHIAL BRUSHINGS;  Surgeon: Brenna Adine CROME, DO;  Location: [facility];  Service: Pulmonary;;   BRONCHIAL WASHINGS  10/26/2020   Procedure: BRONCHIAL WASHINGS;  Surgeon: Brenna Adine CROME, DO;  Location: [facility];  Service: Pulmonary;;   BRONCHIAL WASHINGS  08/19/2022   Procedure: BRONCHIAL WASHINGS;  Surgeon: Brenna Adine CROME, DO;  Location: [facility];  Service: Cardiopulmonary;;   CATARACT EXTRACTION, BILATERAL Bilateral    lens for cataracts   CONVERSION TO TOTAL HIP Right 09/18/2015   Procedure:  CONVERSION OF RIGHT HEMI ARTHROPLASTY TO TOTAL HIP ARTHROPLASTY ACETABULAR REVISON ;  Surgeon: Cough Car, MD;  Location: [facility];  Service: Orthopedics;  Laterality: Right;   HIP ARTHROPLASTY Right 03/01/2013   Procedure: ARTHROPLASTY BIPOLAR HIP;  Surgeon: Cough JONETTA Car, MD;  Location: [facility];  Service: Orthopedics;  Laterality: Right;   HIP CLOSED REDUCTION Right 03/12/2013   Procedure: CLOSED REDUCTION HIP;  Surgeon: Dempsey LULLA Moan, MD;  Location: [facility];  Service: Orthopedics;  Laterality: Right;   HIP CLOSED REDUCTION Right 03/19/2013   Procedure: CLOSED REDUCTION HIP;  Surgeon: Reyes JAYSON Billing, MD;  Location: [facility];  Service: Orthopedics;  Laterality: Right;   HIP CLOSED REDUCTION Right 11/10/2015   Procedure: CLOSED MANIPULATION HIP;  Surgeon: Redell Shoals, MD;  Location: [facility];  Service: Orthopedics;  Laterality: Right;   HIP CLOSED REDUCTION Right 06/05/2017   Procedure: CLOSED REDUCTION HIP;  Surgeon: Heide Ingle, MD;  Location: [facility];  Service: Orthopedics;  Laterality: Right;   SMALL INTESTINE SURGERY     TEE WITHOUT CARDIOVERSION N/A 09/22/2022   Procedure: TRANSESOPHAGEAL ECHOCARDIOGRAM;  Surgeon: Okey Vina LULLA, MD;  [facility];  Service: Cardiovascular;  Laterality: N/A;   TOTAL HIP REVISION Right 06/06/2017   Procedure: TOTAL HIP REVISION;  Surgeon: Heide Ingle, MD;  Location: [facility];  Service: Orthopedics;  Laterality: Right;   VESICOVAGINAL FISTULA CLOSURE W/ TAH  03/25/2003   VIDEO BRONCHOSCOPY N/A 10/26/2020   Procedure: VIDEO BRONCHOSCOPY WITH FLUORO;  Surgeon: Brenna Adine CROME, DO;  Location: [facility];  Service: Pulmonary;  Laterality: N/A;   VIDEO BRONCHOSCOPY Left 08/19/2022   Procedure: VIDEO BRONCHOSCOPY WITH FLUORO; WITH BAL;  Surgeon: Brenna Adine CROME, DO;  Location: [facility];  Service: Cardiopulmonary;  Laterality: Left;   Patient Active Problem List   Diagnosis Date Noted   Neuropathy 11/14/2022   Mild protein malnutrition  10/18/2022   Chronic respiratory failure with hypoxia (HCC) 09/29/2022   Hypogammaglobulinemia 09/22/2022   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) 07/08/2022   GERD (gastroesophageal reflux disease) 06/20/2022   Chronic obstructive pulmonary disease (HCC) 09/24/2021   Pulmonary mycobacterial infection (HCC) 09/24/2021   Bilateral vocal cord paralysis 09/24/2021   UTI (urinary tract infection) 05/24/2021   COPD (chronic obstructive pulmonary disease) (HCC)    Iron deficiency anemia secondary to inadequate dietary iron intake 12/20/2020   Purple toe syndrome of both feet (HCC) 07/26/2020   Cough variant asthma vs vcd  related to MS  11/10/2016   Positional sleep apnea 09/10/2016   Periodic limb movements of sleep 09/10/2016   OSA on CPAP 02/08/2016   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/21/2013   Constipation 02/26/2013   Hyponatremia 05/20/2012   Post-menopausal osteoporosis 09/17/2007   Hypothyroidism 04/01/2007   Multiple sclerosis 04/01/2007     ONSET DATE: chronic  REFERRING DIAG:  G35 (ICD-10-CM) - Multiple sclerosis  G82.50 (ICD-10-CM) - Quadriplegia, unspecified    THERAPY DIAG:  Muscle weakness (generalized)  Unsteadiness on feet  Difficulty in walking, not elsewhere classified  Rationale for Evaluation and Treatment: Rehabilitation  SUBJECTIVE:                                                                                                                                                                                             SUBJECTIVE STATEMENT: Pt reports more difficulty moving today due to snow and cold.  Her right hip is painful today.  She denies falls or other acute issues. Pt accompanied by: self  PERTINENT HISTORY: MS   PAIN:  Are you having pain? Yes: NPRS scale: 4/10 Pain location: bilateral hips esp right anterior hip Pain description: sore Aggravating factors: walking - feels like it might give out Relieving factors:  rest  PRECAUTIONS: Fall  RED FLAGS: None   WEIGHT BEARING RESTRICTIONS: No  FALLS: Has patient fallen in last 6 months? No  LIVING ENVIRONMENT: Lives with: lives with their spouse Lives in: House/apartment Stairs:  4 about home Has following equipment at home: Vannie - 4 wheeled, Shower bench, Grab bars, and modified vehicle with hand controls, scooter  PLOF: Independent with community mobility with device  PATIENT GOALS: improve balance; walk better, be able to stand and cook  OBJECTIVE:  Note: Objective measures were completed at Evaluation unless otherwise noted.  COGNITION: Overall cognitive status: Within functional limits for tasks assessed   SENSATION: impaired  COORDINATION: LE impaired  MUSCLE TONE: LE impaired  MUSCLE LENGTH: hyper knees into ext  DTRs: TBT  POSTURE: anterior pelvic tilt, right pelvic obliquity, weight shift left, and bilateral hyperextension of knees  LOWER EXTREMITY ROM:     Wfl  LOWER EXTREMITY MMT:    MMT Right Eval Left Eval  Hip flexion 3- 3-  Hip extension    Hip abduction    Hip adduction    Hip internal rotation    Hip external rotation    Knee flexion 3 3  Knee extension 3 3  Ankle dorsiflexion 2 3  Ankle plantarflexion    Ankle inversion    Ankle eversion    (Blank rows = not tested)  BED MOBILITY:  indep  TRANSFERS: Sit to stand: Modified independence  Assistive device utilized: Environmental Consultant - 4 wheeled    heavy ue support   STAIRS: 4 steps occasionally used with hand rails indep GAIT: Findings: Gait Characteristics: Right hip hike, genu recurvatum- Right, genu recurvatum- Left, trendelenburg, and trunk flexed, Distance walked: 500 ft, Assistive device utilized:Walker - 4 wheeled, Level of assistance: Modified independence  FUNCTIONAL TESTS:  Timed up and go (TUG): 46.46 2 MWT128 ft using rollator 4 stage balance: FT close supervision; semi tandem, tandem and SLS unable to complete  PATIENT SURVEYS:   tba                                                                                                                              TREATMENT  Applied MHP to Right hip and low back per pt request at onset of session: -FADIR/FABER neg (R hip) -Scour neg (R hip)  -Attempted R SLR w/ 2lb ankle wt and isometric bimanual 2lb wt hold, not tolerated, removed wts -Attempted heel slide w/ pt verbalizing sharp pain in groin -Attempted IR/ER in full leg extension and bent knee w/ similar acute groin pain -active assisted hip and knee flexion w/ similar pain -Sitting EOM:  repeated abd/add and marching and LAQ caused no additional pain  PATIENT EDUCATION: Education details:  Ice/heat for pain at home.  TPDN plan to try at end of current POC due to availability of alternative provider, discussed and coordinated scheduling.  Encouraged her to follow-up with her orthopedic MD about possible hip MRI due to recent intolerance to therapy, leg positioning and recent functional decline of the RLE. Person educated: Patient Education method: Explanation Education comprehension: verbalized understanding  HOME EXERCISE PROGRAM: Access Code: [email or web address] URL: [email or web address] Date: 02/05/2024 Prepared by: Daved Bull  Exercises -  Corner Balance Feet Together With Eyes Open  - 1 x daily - 4 x weekly - 1 sets - 2-3 reps - 30 seconds hold - Standing Near Stance in [place] with Eyes Closed  - 1 x daily - 4 x weekly - 1 sets - 2-3 reps - 30 seconds hold - Heel Raises with Counter Support  - 1 x daily - 4 x weekly - 1-2 sets - 5-6 reps - Squat with Chair Touch  - 1 x daily - 4 x weekly - 2 sets - 10 reps - Side Stepping with Resistance at Thighs and Counter Support  - 1 x daily - 4 x weekly - 3 sets - 10 reps  GOALS: Goals reviewed with patient? Yes  SHORT TERM GOALS: Target date: 02/21/24  Pt will tolerate full aquatic sessions consistently without increase in pain and with improving  function to demonstrate good toleration and effectiveness of intervention.  Baseline:  tolerating well (12/8) Goal status: MET    LONG TERM GOALS: Target date: 03/18/25  Pt will be indep with final Aquatic HEP for continued management of condition Baseline:  Goal status: INITIAL  2.  Pt will improve to at least 156ft  to demonstrate improved ability to ambulate into and out of MD offices/clinics. (MCD = 84ft) Baseline: 128 ft Goal status: INITIAL  3.  Pt will improve on Tug test to <or=  40s to demonstrate improvement in lower extremity function, mobility and decreased fall risk. Baseline: 46.46 Goal status: INITIAL  4.  Pt will report improved walking ability and balance with daily function. Baseline:  Goal status: INITIAL  5.  Pt will demonstrate mindfulness with posture for improved alignment Baseline:  Goal status: INITIAL    ASSESSMENT:  CLINICAL IMPRESSION: Land session again limited today due to patient pain and functional decline of the RLE.  She maintains the RLE in IR when supine and has difficulty with primarily active hip flexion and IR.  Minimal pain when in abducted position or butterfly stretch.  Scour, FABER, and FADIR of the R hip were negative.  Do to recent acute inner groin pain, tenderness to adductor palpation and functional changes in the RLE she may benefit from further medical assessment.  Did discuss TPDN as supplement for muscular tenderness and scheduled visit for end of current POC that will require re-cert.  Continue per POC.  OBJECTIVE IMPAIRMENTS: Abnormal gait, decreased activity tolerance, decreased balance, decreased coordination, decreased endurance, decreased mobility, difficulty walking, decreased strength, impaired tone, and postural dysfunction.   ACTIVITY LIMITATIONS: carrying, lifting, bending, sitting, standing, squatting, stairs, transfers, and locomotion level  PARTICIPATION LIMITATIONS: meal prep, cleaning, laundry, shopping,  community activity, yard work, and cooking  PERSONAL FACTORS: Time since onset of injury/illness/exacerbation are also affecting patient's functional outcome.   REHAB POTENTIAL: Fair -good for stated goals  CLINICAL DECISION MAKING: Stable/uncomplicated  EVALUATION COMPLEXITY: Low  PLAN:  PT FREQUENCY: 1-2x/week  PT DURATION: 6 weeks  PLANNED INTERVENTIONS: 97164- PT Re-evaluation, 97750- Physical Performance Testing, 97110-Therapeutic exercises, 97530- Therapeutic activity, [id number]- Neuromuscular re-education, 97535- Self Care, [id number]- Manual therapy, [id number]- Gait training, [phone]- Aquatic Therapy, [phone]- Ionotophoresis 4mg /ml Dexamethasone , 79439 (1-2 muscles), 20561 (3+ muscles)- Dry Needling, Patient/Family education, Balance training, Stair training, Taping, Joint mobilization, DME instructions, Cryotherapy, and Moist heat  PLAN FOR NEXT SESSION: aquatics: strengthening; balance retraining, gait HEP; TPDN at end of current POC - schedule 12/30 at 245 w/ Taylor blocked; did she schedule ortho follow-up for R hip pain?  Aquatic HEP; standing  tolerance and BLE strength, leg press, bilateral stance training/static balance; SLS - falling star, modified tree pose, warrior poses, stair step to offset squat  Daved Bull, PT, DPT

## 2024-03-02 IMAGING — CT CT CHEST W/O CM
2 of 4 series · 15 of 36 positions shown, 18 images · non-contrast
Comparison: 10/24/2020.

CLINICAL DATA: Recurrent pneumonia, shortness of breath, cough,
history of COPD.



[Series 2: thorax · axial · 0.68mm/px · z∈[-264,+0]mm · 12 of 158 slices shown, 15 images]
[im 13/158  mediastinal]
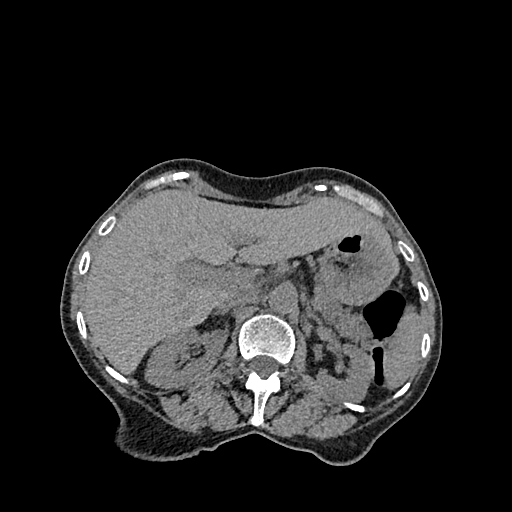
[im 13/158  lung]
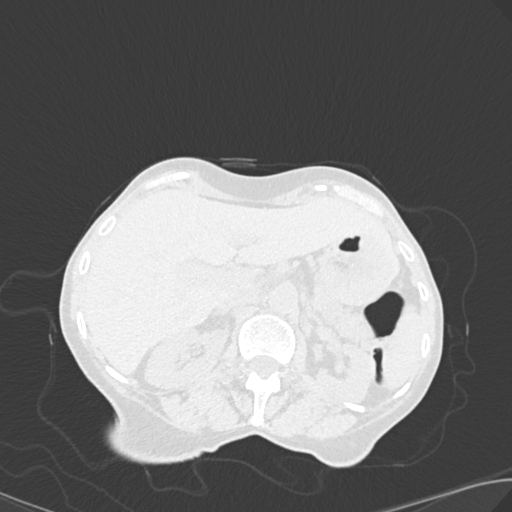
[im 25/158  lung]
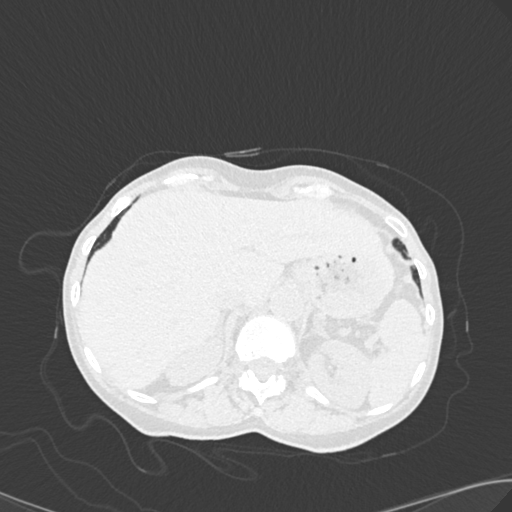
[im 37/158  lung]
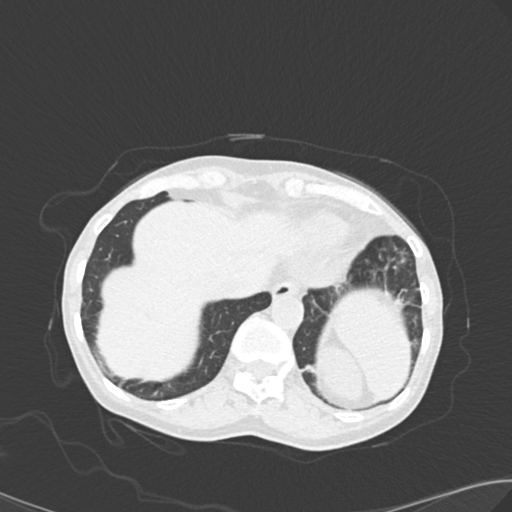
[im 49/158  lung]
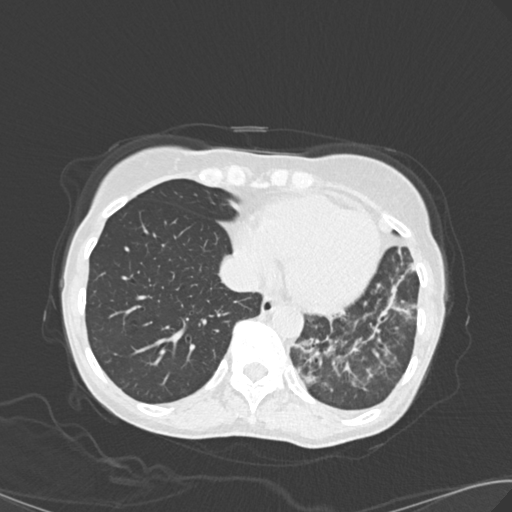
[im 61/158  mediastinal]
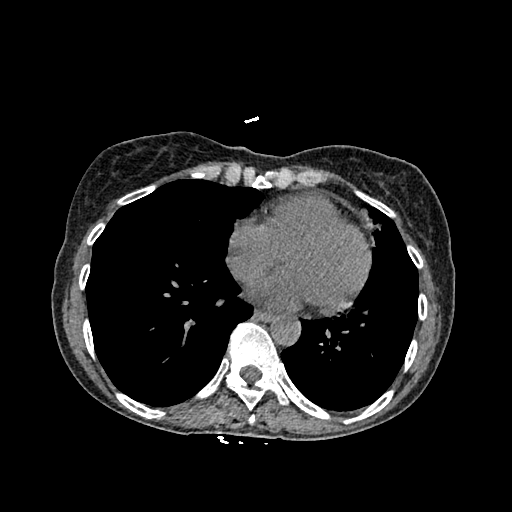
[im 61/158  lung]
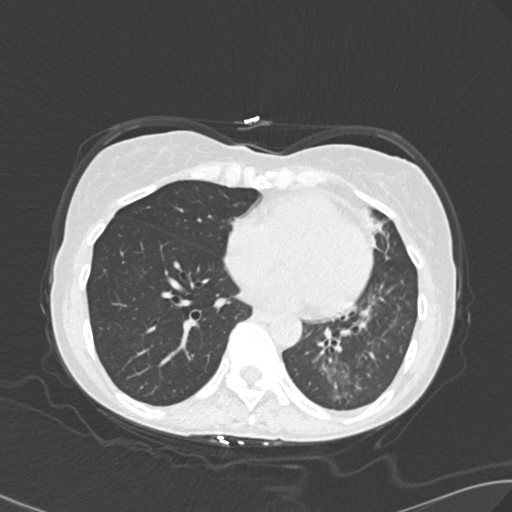
[im 73/158  lung]
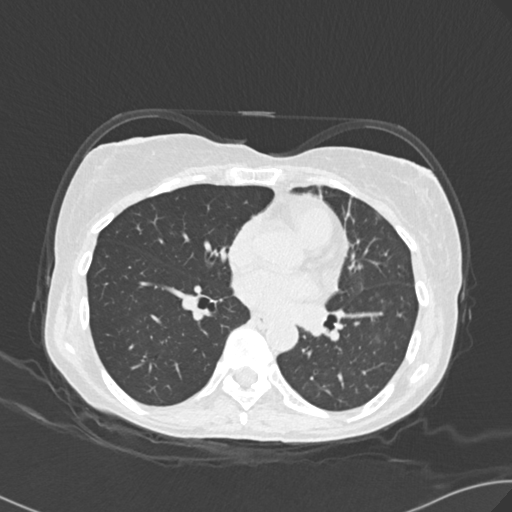
[im 85/158  lung]
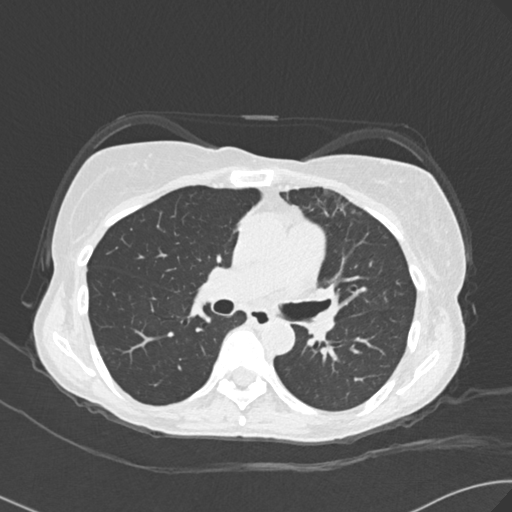
[im 97/158  lung]
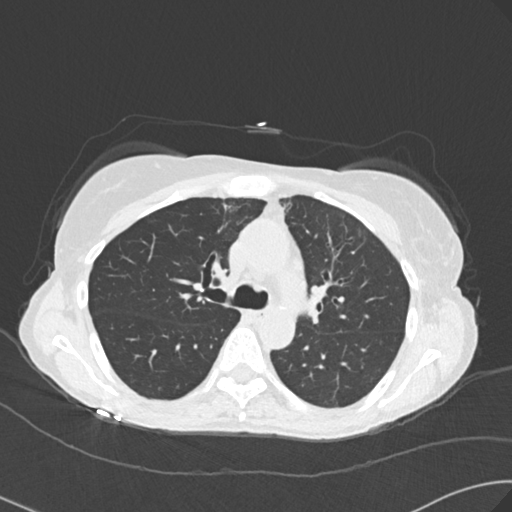
[im 109/158  mediastinal]
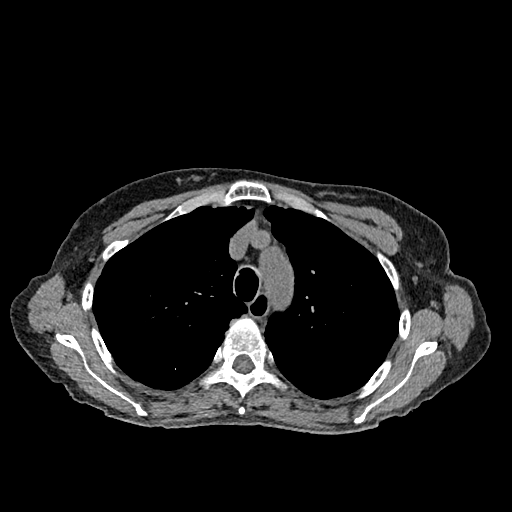
[im 109/158  lung]
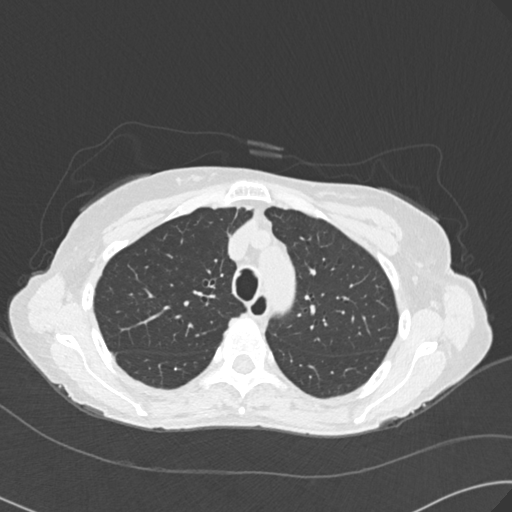
[im 121/158  lung]
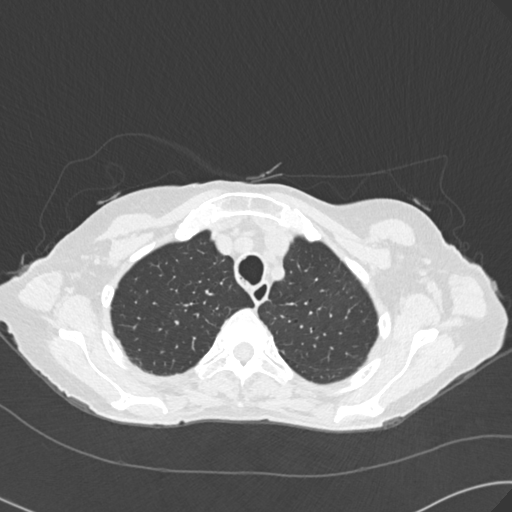
[im 133/158  lung]
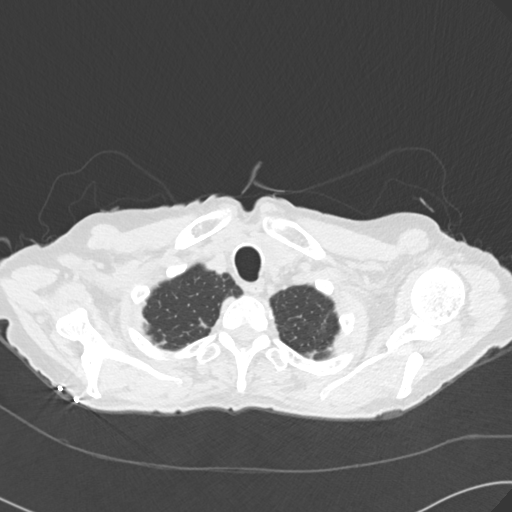
[im 145/158  lung]
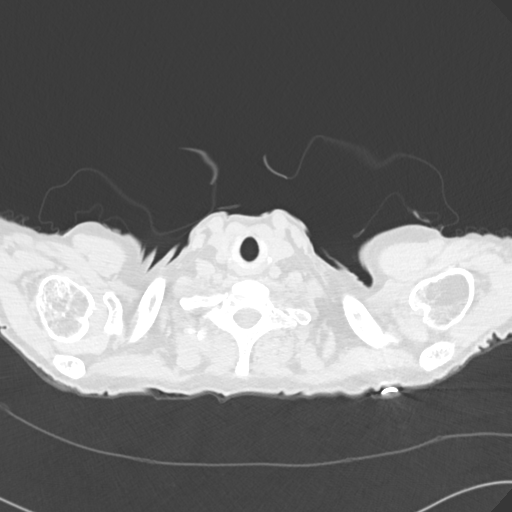

[Series 5: coronal · coronal · 0.64mm/px · 3 of 123 slices shown]
[im 25/123  lung]
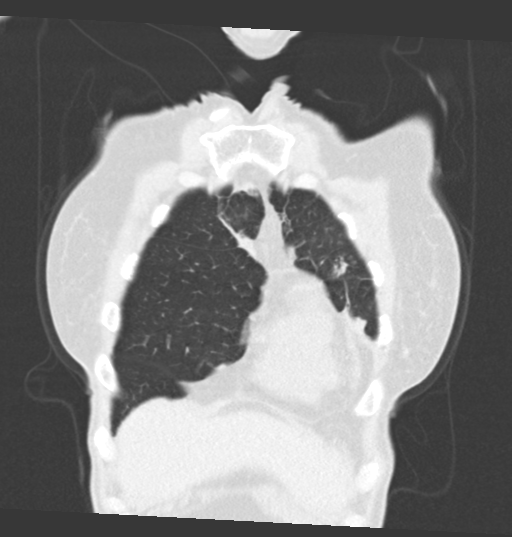
[im 49/123  lung]
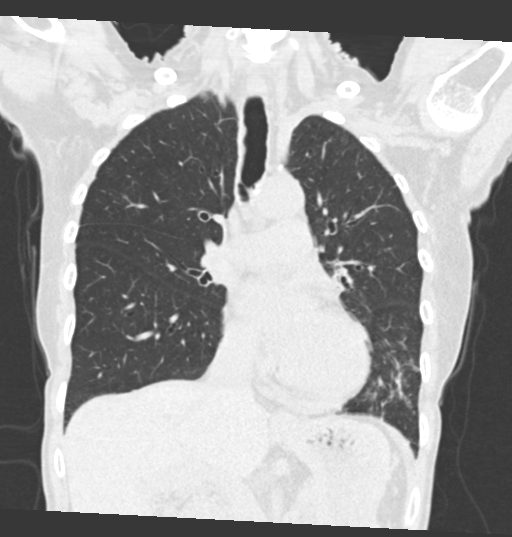
[im 74/123  lung]
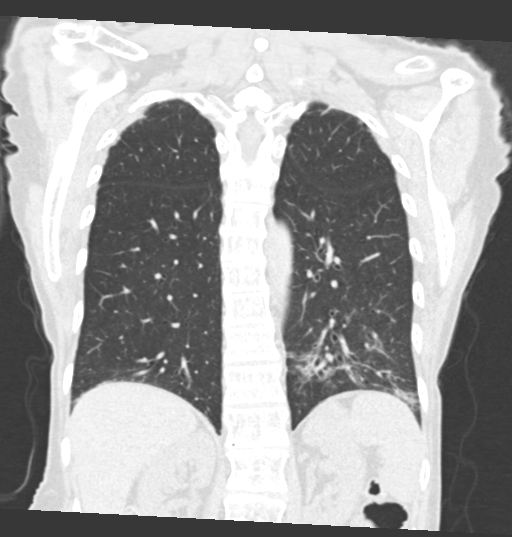

[15 of 36 positions shown; findings below may reference images not displayed]

FINDINGS: Cardiovascular: The heart is normal in size and there is a trace
pericardial effusion. There is atherosclerotic calcification of the
aorta without evidence of aneurysm. The pulmonary trunk is normal in
caliber.

Mediastinum/Nodes: Nonenlarged lymph nodes are present in the
mediastinum and axillary regions bilaterally. Evaluation of the hila
is limited due to lack of IV contrast. The thyroid gland is within
normal limits. Frothy debris is noted in the trachea and left
mainstem bronchus. The esophagus is within normal limits.

Lungs/Pleura: Mild apical pleural scarring is noted bilaterally.
There is mild consolidation in the medial aspect of the right upper
lobe. Mild atelectasis is noted at the right lung base. There are
patchy infiltrates in the mid to lower left lung field. No effusion
or pneumothorax.

Upper Abdomen: Stable hypodensities are present in the right lobe of
the liver.

Musculoskeletal: Degenerative changes are present in the thoracic
spine. There is a stable bony deformity of the proximal sternal
body, likely related to old healed fracture. No acute osseous
abnormality.
IMPRESSION: 1. Mild consolidation in the medial aspect of the right upper lobe
and patchy infiltrates in the mid to lower lung fields on the left,
concerning for pneumonia. Frothy debris is present in the trachea
and left mainstem bronchus and aspiration pneumonia should be
considered in the differential diagnosis.
2. Aortic atherosclerosis.

## 2024-03-03 ENCOUNTER — Ambulatory Visit: Payer: Self-pay | Admitting: Physical Therapy

## 2024-03-03 ENCOUNTER — Encounter: Payer: Self-pay | Admitting: Physical Therapy

## 2024-03-03 DIAGNOSIS — M6281 Muscle weakness (generalized): Secondary | ICD-10-CM

## 2024-03-03 DIAGNOSIS — R262 Difficulty in walking, not elsewhere classified: Secondary | ICD-10-CM

## 2024-03-03 DIAGNOSIS — N390 Urinary tract infection, site not specified: Secondary | ICD-10-CM | POA: Diagnosis not present

## 2024-03-03 DIAGNOSIS — R350 Frequency of micturition: Secondary | ICD-10-CM | POA: Diagnosis not present

## 2024-03-03 DIAGNOSIS — R2681 Unsteadiness on feet: Secondary | ICD-10-CM

## 2024-03-03 DIAGNOSIS — R3914 Feeling of incomplete bladder emptying: Secondary | ICD-10-CM | POA: Diagnosis not present

## 2024-03-03 NOTE — Therapy (Signed)
 OUTPATIENT PHYSICAL THERAPY NEURO TREATMENT   Patient Name: Kayla [name] MRN: [id number] DOB:07-01-1950, 73 y.o., female Today's Date: 03/03/2024   PCP: Debby FREDRIK Molt MD REFERRING PROVIDER: Murl Cough, MD   END OF SESSION:  PT End of Session - 03/03/24 0932     Visit Number 8    Number of Visits 13   12 + eval   Date for Recertification  03/18/24    Authorization Type mcr    Progress Note Due on Visit 10    PT Start Time 0931    PT Stop Time 1015    PT Time Calculation (min) 44 min    Equipment Utilized During Treatment Other (comment)   aquatic devices as needed for safety and challenge   Activity Tolerance Patient tolerated treatment well    Behavior During Therapy WFL for tasks assessed/performed          Past Medical History:  Diagnosis Date   Arthritis    oa   Asthma    Bilateral vocal cord paralysis    Bronchiectasis (HCC)    Cervical cancer (HCC) 2005   Depression    GERD (gastroesophageal reflux disease)    Hiatal hernia    Hypothyroidism    Immunodeficiency    Gets IVIG infusions   Insomnia    Multiple sclerosis 1996   Osteoporosis    Pneumonia 2024, 2014, 1992   Urine retention    Past Surgical History:  Procedure Laterality Date   ABDOMINAL HYSTERECTOMY  03/25/2003   for cervical cancer, complete   BRONCHIAL BIOPSY  10/26/2020   Procedure: BRONCHIAL BIOPSIES;  Surgeon: Brenna Adine CROME, DO;  Location: [facility];  Service: Pulmonary;;   BRONCHIAL BRUSHINGS  10/26/2020   Procedure: BRONCHIAL BRUSHINGS;  Surgeon: Brenna Adine CROME, DO;  Location: [facility];  Service: Pulmonary;;   BRONCHIAL WASHINGS  10/26/2020   Procedure: BRONCHIAL WASHINGS;  Surgeon: Brenna Adine CROME, DO;  Location: [facility];  Service: Pulmonary;;   BRONCHIAL WASHINGS  08/19/2022   Procedure: BRONCHIAL WASHINGS;  Surgeon: Brenna Adine CROME, DO;  Location: [facility];  Service: Cardiopulmonary;;   CATARACT EXTRACTION, BILATERAL Bilateral    lens for  cataracts   CONVERSION TO TOTAL HIP Right 09/18/2015   Procedure: CONVERSION OF RIGHT HEMI ARTHROPLASTY TO TOTAL HIP ARTHROPLASTY ACETABULAR REVISON ;  Surgeon: Cough Car, MD;  Location: [facility];  Service: Orthopedics;  Laterality: Right;   HIP ARTHROPLASTY Right 03/01/2013   Procedure: ARTHROPLASTY BIPOLAR HIP;  Surgeon: Cough JONETTA Car, MD;  Location: [facility];  Service: Orthopedics;  Laterality: Right;   HIP CLOSED REDUCTION Right 03/12/2013   Procedure: CLOSED REDUCTION HIP;  Surgeon: Dempsey LULLA Moan, MD;  Location: [facility];  Service: Orthopedics;  Laterality: Right;   HIP CLOSED REDUCTION Right 03/19/2013   Procedure: CLOSED REDUCTION HIP;  Surgeon: Reyes JAYSON Billing, MD;  Location: [facility];  Service: Orthopedics;  Laterality: Right;   HIP CLOSED REDUCTION Right 11/10/2015   Procedure: CLOSED MANIPULATION HIP;  Surgeon: Redell Shoals, MD;  Location: [facility];  Service: Orthopedics;  Laterality: Right;   HIP CLOSED REDUCTION Right 06/05/2017   Procedure: CLOSED REDUCTION HIP;  Surgeon: Heide Ingle, MD;  Location: [facility];  Service: Orthopedics;  Laterality: Right;   SMALL INTESTINE SURGERY     TEE WITHOUT CARDIOVERSION N/A 09/22/2022   Procedure: TRANSESOPHAGEAL ECHOCARDIOGRAM;  Surgeon: Okey Vina LULLA, MD;  Location: [facility];  Service: Cardiovascular;  Laterality: N/A;   TOTAL HIP REVISION Right 06/06/2017  Procedure: TOTAL HIP REVISION;  Surgeon: Heide Ingle, MD;  Location: [facility];  Service: Orthopedics;  Laterality: Right;   VESICOVAGINAL FISTULA CLOSURE W/ TAH  03/25/2003   VIDEO BRONCHOSCOPY N/A 10/26/2020   Procedure: VIDEO BRONCHOSCOPY WITH FLUORO;  Surgeon: Brenna Adine CROME, DO;  Location: [facility];  Service: Pulmonary;  Laterality: N/A;   VIDEO BRONCHOSCOPY Left 08/19/2022   Procedure: VIDEO BRONCHOSCOPY WITH FLUORO; WITH BAL;  Surgeon: Brenna Adine CROME, DO;  Location: [facility];  Service: Cardiopulmonary;  Laterality: Left;   Patient Active Problem List    Diagnosis Date Noted   Neuropathy 11/14/2022   Mild protein malnutrition 10/18/2022   Chronic respiratory failure with hypoxia (HCC) 09/29/2022   Hypogammaglobulinemia 09/22/2022   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) 07/08/2022   GERD (gastroesophageal reflux disease) 06/20/2022   Chronic obstructive pulmonary disease (HCC) 09/24/2021   Pulmonary mycobacterial infection (HCC) 09/24/2021   Bilateral vocal cord paralysis 09/24/2021   UTI (urinary tract infection) 05/24/2021   COPD (chronic obstructive pulmonary disease) (HCC)    Iron deficiency anemia secondary to inadequate dietary iron intake 12/20/2020   Purple toe syndrome of both feet (HCC) 07/26/2020   Cough variant asthma vs vcd  related to MS  11/10/2016   Positional sleep apnea 09/10/2016   Periodic limb movements of sleep 09/10/2016   OSA on CPAP 02/08/2016   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/21/2013   Constipation 02/26/2013   Hyponatremia 05/20/2012   Post-menopausal osteoporosis 09/17/2007   Hypothyroidism 04/01/2007   Multiple sclerosis 04/01/2007     ONSET DATE: chronic  REFERRING DIAG:  G35 (ICD-10-CM) - Multiple sclerosis  G82.50 (ICD-10-CM) - Quadriplegia, unspecified    THERAPY DIAG:  Muscle weakness (generalized)  Unsteadiness on feet  Difficulty in walking, not elsewhere classified  Rationale for Evaluation and Treatment: Rehabilitation  SUBJECTIVE:                                                                                                                                                                                             SUBJECTIVE STATEMENT: She has an MRI of her hip scheduled for Monday and will follow-up with ortho MD in January 2026. She denies falls or other acute issues. Pt accompanied by: self  PERTINENT HISTORY: MS   PAIN:  Are you having pain? Yes: NPRS scale: 2/10 Pain location: bilateral hips esp right anterior hip Pain description: sore Aggravating  factors: walking - feels like it might give out Relieving factors: rest  PRECAUTIONS: Fall  RED FLAGS: None   WEIGHT BEARING RESTRICTIONS: No  FALLS: Has patient fallen in last 6 months? No  LIVING ENVIRONMENT:  Lives with: lives with their spouse Lives in: House/apartment Stairs: 4 about home Has following equipment at home: Environmental Consultant - 4 wheeled, Shower bench, Grab bars, and modified vehicle with hand controls, scooter  PLOF: Independent with community mobility with device  PATIENT GOALS: improve balance; walk better, be able to stand and cook  OBJECTIVE:  Note: Objective measures were completed at Evaluation unless otherwise noted.  COGNITION: Overall cognitive status: Within functional limits for tasks assessed   SENSATION: impaired  COORDINATION: LE impaired  MUSCLE TONE: LE impaired  MUSCLE LENGTH: hyper knees into ext  DTRs: TBT  POSTURE: anterior pelvic tilt, right pelvic obliquity, weight shift left, and bilateral hyperextension of knees  LOWER EXTREMITY ROM:     Wfl  LOWER EXTREMITY MMT:    MMT Right Eval Left Eval  Hip flexion 3- 3-  Hip extension    Hip abduction    Hip adduction    Hip internal rotation    Hip external rotation    Knee flexion 3 3  Knee extension 3 3  Ankle dorsiflexion 2 3  Ankle plantarflexion    Ankle inversion    Ankle eversion    (Blank rows = not tested)  BED MOBILITY:  indep  TRANSFERS: Sit to stand: Modified independence  Assistive device utilized: Environmental Consultant - 4 wheeled    heavy ue support   STAIRS: 4 steps occasionally used with hand rails indep GAIT: Findings: Gait Characteristics: Right hip hike, genu recurvatum- Right, genu recurvatum- Left, trendelenburg, and trunk flexed, Distance walked: 500 ft, Assistive device utilized:Walker - 4 wheeled, Level of assistance: Modified independence  FUNCTIONAL TESTS:  Timed up and go (TUG): 46.46 2 MWT128 ft using rollator 4 stage balance: FT close supervision; semi  tandem, tandem and SLS unable to complete  PATIENT SURVEYS:  tba                                                                                                                              TREATMENT  Aquatic therapy at [facility] - pool temperature 92 degrees   Patient seen for aquatic therapy today.  Treatment took place in water  3.6-4.8 feet deep depending upon activity.  Patient entered and exited the pool via stairs using step to pattern and left rail only (2 hands support) at SBA level.   Exercises: Warmup:  water  walking unsupported 4x18 ft forward > backward > laterally  -Walking march w/ contralateral rotation using multicolor low resistance DB 4x18 ft -Modified plantigrade hamstring curls > hip extension 2x10 each task each LE -Falling star w/ yellow noodle support 2x10 each side, unable to successfully progress to holds, but good stepping strategy w/ LOB -Modified warrior III using yellow noodle alt LE x10, pt has increased hip extension ROM on RLE vs LLE -Floating on noodle for spasticity and pain management x4 minutes at end of session  Patient requires buoyancy of the water  for support for reduced fall risk with gait training and  balance exercises with SBA support, return demonstration and min cues for form and modifications. Exercises able to be performed safely in water  without the risk of fall compared to those same exercises performed on land; viscosity of water  needed for resistance for strengthening. Current of water  provides perturbations for challenging static and dynamic balance.   PATIENT EDUCATION: Education details:  Continue pain management techniques at home.  Aquatic rationale. Person educated: Patient Education method: Explanation Education comprehension: verbalized understanding  HOME EXERCISE PROGRAM: Access Code: [email or web address] URL: [email or web address] Date: 02/05/2024 Prepared by: Daved Bull  Exercises - Corner Balance Feet  Together With Eyes Open  - 1 x daily - 4 x weekly - 1 sets - 2-3 reps - 30 seconds hold - Standing Near Stance in [place] with Eyes Closed  - 1 x daily - 4 x weekly - 1 sets - 2-3 reps - 30 seconds hold - Heel Raises with Counter Support  - 1 x daily - 4 x weekly - 1-2 sets - 5-6 reps - Squat with Chair Touch  - 1 x daily - 4 x weekly - 2 sets - 10 reps - Side Stepping with Resistance at Thighs and Counter Support  - 1 x daily - 4 x weekly - 3 sets - 10 reps  GOALS: Goals reviewed with patient? Yes  SHORT TERM GOALS: Target date: 02/21/24  Pt will tolerate full aquatic sessions consistently without increase in pain and with improving function to demonstrate good toleration and effectiveness of intervention.  Baseline:  tolerating well (12/8) Goal status: MET    LONG TERM GOALS: Target date: 03/18/25  Pt will be indep with final Aquatic HEP for continued management of condition Baseline:  Goal status: INITIAL  2.  Pt will improve to at least 170ft  to demonstrate improved ability to ambulate into and out of MD offices/clinics. (MCD = 37ft) Baseline: 128 ft Goal status: INITIAL  3.  Pt will improve on Tug test to <or=  40s to demonstrate improvement in lower extremity function, mobility and decreased fall risk. Baseline: 46.46 Goal status: INITIAL  4.  Pt will report improved walking ability and balance with daily function. Baseline:  Goal status: INITIAL  5.  Pt will demonstrate mindfulness with posture for improved alignment Baseline:  Goal status: INITIAL    ASSESSMENT:  CLINICAL IMPRESSION: Focus of skilled aquatic session today on progressing to dynamic and static SLS variations.  She has difficulty holding static SL positions, but is able to recover LOB well with various balance strategies (primarily stepping) demonstrated.  Her pain was better managed this visit and she has a follow-up MRI scheduled to ensure no new or unknown challenges with right hip due to  chronic compensatory gait pattern and fall risk.  She continues to benefit from skilled PT in this setting to improve motor control, confidence in balance and factors contributing to long standing fall risk.  She is motivated and compliant to trained interventions.  Will continue per POC.  OBJECTIVE IMPAIRMENTS: Abnormal gait, decreased activity tolerance, decreased balance, decreased coordination, decreased endurance, decreased mobility, difficulty walking, decreased strength, impaired tone, and postural dysfunction.   ACTIVITY LIMITATIONS: carrying, lifting, bending, sitting, standing, squatting, stairs, transfers, and locomotion level  PARTICIPATION LIMITATIONS: meal prep, cleaning, laundry, shopping, community activity, yard work, and cooking  PERSONAL FACTORS: Time since onset of injury/illness/exacerbation are also affecting patient's functional outcome.   REHAB POTENTIAL: Fair -good for stated goals  CLINICAL DECISION MAKING: Stable/uncomplicated  EVALUATION COMPLEXITY: Low  PLAN:  PT FREQUENCY: 1-2x/week  PT DURATION: 6 weeks  PLANNED INTERVENTIONS: 97164- PT Re-evaluation, 97750- Physical Performance Testing, 97110-Therapeutic exercises, 97530- Therapeutic activity, [id number]- Neuromuscular re-education, 97535- Self Care, [id number]- Manual therapy, [phone]- Gait training, [phone]- Aquatic Therapy, [phone]- Ionotophoresis 4mg /ml Dexamethasone , 79439 (1-2 muscles), 20561 (3+ muscles)- Dry Needling, Patient/Family education, Balance training, Stair training, Taping, Joint mobilization, DME instructions, Cryotherapy, and Moist heat  PLAN FOR NEXT SESSION: aquatics: strengthening; balance retraining, gait HEP; TPDN at end of current POC - schedule 12/30 at 245 w/ [place] blocked; did she schedule ortho follow-up for R hip pain?  Aquatic HEP; standing tolerance and BLE strength, leg press, bilateral stance training/static balance; SLS - modified tree pose, warrior poses, stair step to offset  squat  Daved Bull, PT, DPT

## 2024-03-07 DIAGNOSIS — M25551 Pain in right hip: Secondary | ICD-10-CM | POA: Diagnosis not present

## 2024-03-09 ENCOUNTER — Ambulatory Visit: Payer: Self-pay | Admitting: Physical Therapy

## 2024-03-09 ENCOUNTER — Encounter: Payer: Self-pay | Admitting: Physical Therapy

## 2024-03-09 DIAGNOSIS — M6281 Muscle weakness (generalized): Secondary | ICD-10-CM | POA: Diagnosis not present

## 2024-03-09 DIAGNOSIS — R2681 Unsteadiness on feet: Secondary | ICD-10-CM

## 2024-03-09 DIAGNOSIS — R262 Difficulty in walking, not elsewhere classified: Secondary | ICD-10-CM

## 2024-03-09 NOTE — Therapy (Signed)
 OUTPATIENT PHYSICAL THERAPY NEURO TREATMENT   Patient Name: Kayla [name] MRN: [id number] DOB:08/10/1950, 73 y.o., female Today's Date: 03/09/2024   PCP: Debby FREDRIK Molt MD REFERRING PROVIDER: Murl Cough, MD   END OF SESSION:  PT End of Session - 03/09/24 1200     Visit Number 9    Number of Visits 13   12 + eval   Date for Recertification  03/18/24    Authorization Type mcr    Progress Note Due on Visit 10    PT Start Time 1158   pt arrived late   PT Stop Time 1233    PT Time Calculation (min) 35 min    Equipment Utilized During Treatment Gait belt    Activity Tolerance Patient tolerated treatment well    Behavior During Therapy WFL for tasks assessed/performed          Past Medical History:  Diagnosis Date   Arthritis    oa   Asthma    Bilateral vocal cord paralysis    Bronchiectasis (HCC)    Cervical cancer (HCC) 2005   Depression    GERD (gastroesophageal reflux disease)    Hiatal hernia    Hypothyroidism    Immunodeficiency    Gets IVIG infusions   Insomnia    Multiple sclerosis 1996   Osteoporosis    Pneumonia 2024, 2014, 1992   Urine retention    Past Surgical History:  Procedure Laterality Date   ABDOMINAL HYSTERECTOMY  03/25/2003   for cervical cancer, complete   BRONCHIAL BIOPSY  10/26/2020   Procedure: BRONCHIAL BIOPSIES;  Surgeon: Brenna Adine CROME, DO;  Location: [facility];  Service: Pulmonary;;   BRONCHIAL BRUSHINGS  10/26/2020   Procedure: BRONCHIAL BRUSHINGS;  Surgeon: Brenna Adine CROME, DO;  Location: [facility];  Service: Pulmonary;;   BRONCHIAL WASHINGS  10/26/2020   Procedure: BRONCHIAL WASHINGS;  Surgeon: Brenna Adine CROME, DO;  Location: [facility];  Service: Pulmonary;;   BRONCHIAL WASHINGS  08/19/2022   Procedure: BRONCHIAL WASHINGS;  Surgeon: Brenna Adine CROME, DO;  Location: [facility];  Service: Cardiopulmonary;;   CATARACT EXTRACTION, BILATERAL Bilateral    lens for cataracts   CONVERSION TO TOTAL HIP Right  09/18/2015   Procedure: CONVERSION OF RIGHT HEMI ARTHROPLASTY TO TOTAL HIP ARTHROPLASTY ACETABULAR REVISON ;  Surgeon: Cough Car, MD;  Location: [facility];  Service: Orthopedics;  Laterality: Right;   HIP ARTHROPLASTY Right 03/01/2013   Procedure: ARTHROPLASTY BIPOLAR HIP;  Surgeon: Cough JONETTA Car, MD;  Location: [facility];  Service: Orthopedics;  Laterality: Right;   HIP CLOSED REDUCTION Right 03/12/2013   Procedure: CLOSED REDUCTION HIP;  Surgeon: Dempsey LULLA Moan, MD;  Location: [facility];  Service: Orthopedics;  Laterality: Right;   HIP CLOSED REDUCTION Right 03/19/2013   Procedure: CLOSED REDUCTION HIP;  Surgeon: Reyes JAYSON Billing, MD;  Location: [facility];  Service: Orthopedics;  Laterality: Right;   HIP CLOSED REDUCTION Right 11/10/2015   Procedure: CLOSED MANIPULATION HIP;  Surgeon: Redell Shoals, MD;  Location: [facility];  Service: Orthopedics;  Laterality: Right;   HIP CLOSED REDUCTION Right 06/05/2017   Procedure: CLOSED REDUCTION HIP;  Surgeon: Heide Ingle, MD;  Location: [facility];  Service: Orthopedics;  Laterality: Right;   SMALL INTESTINE SURGERY     TEE WITHOUT CARDIOVERSION N/A 09/22/2022   Procedure: TRANSESOPHAGEAL ECHOCARDIOGRAM;  Surgeon: Okey Vina LULLA, MD;  Location: [facility];  Service: Cardiovascular;  Laterality: N/A;   TOTAL HIP REVISION Right 06/06/2017   Procedure: TOTAL HIP REVISION;  Surgeon: Heide Ingle, MD;  Location: [facility];  Service: Orthopedics;  Laterality: Right;   VESICOVAGINAL FISTULA CLOSURE W/ TAH  03/25/2003   VIDEO BRONCHOSCOPY N/A 10/26/2020   Procedure: VIDEO BRONCHOSCOPY WITH FLUORO;  Surgeon: Brenna Adine CROME, DO;  Location: [facility];  Service: Pulmonary;  Laterality: N/A;   VIDEO BRONCHOSCOPY Left 08/19/2022   Procedure: VIDEO BRONCHOSCOPY WITH FLUORO; WITH BAL;  Surgeon: Brenna Adine CROME, DO;  Location: [facility];  Service: Cardiopulmonary;  Laterality: Left;   Patient Active Problem List   Diagnosis Date Noted   Neuropathy 11/14/2022    Mild protein malnutrition 10/18/2022   Chronic respiratory failure with hypoxia (HCC) 09/29/2022   Hypogammaglobulinemia 09/22/2022   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) 07/08/2022   GERD (gastroesophageal reflux disease) 06/20/2022   Chronic obstructive pulmonary disease (HCC) 09/24/2021   Pulmonary mycobacterial infection (HCC) 09/24/2021   Bilateral vocal cord paralysis 09/24/2021   UTI (urinary tract infection) 05/24/2021   COPD (chronic obstructive pulmonary disease) (HCC)    Iron deficiency anemia secondary to inadequate dietary iron intake 12/20/2020   Purple toe syndrome of both feet (HCC) 07/26/2020   Cough variant asthma vs vcd  related to MS  11/10/2016   Positional sleep apnea 09/10/2016   Periodic limb movements of sleep 09/10/2016   OSA on CPAP 02/08/2016   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/21/2013   Constipation 02/26/2013   Hyponatremia 05/20/2012   Post-menopausal osteoporosis 09/17/2007   Hypothyroidism 04/01/2007   Multiple sclerosis 04/01/2007     ONSET DATE: chronic  REFERRING DIAG:  G35 (ICD-10-CM) - Multiple sclerosis  G82.50 (ICD-10-CM) - Quadriplegia, unspecified    THERAPY DIAG:  Muscle weakness (generalized)  Unsteadiness on feet  Difficulty in walking, not elsewhere classified  Rationale for Evaluation and Treatment: Rehabilitation  SUBJECTIVE:                                                                                                                                                                                             SUBJECTIVE STATEMENT: She has not heard back from her hip MRI on Monday and feels she may not until her appt with her orthopedic MD in January. She denies falls or other acute issues.  She ambulates w/ 2WW today as her rollator is in the truck and it was too cold to get it out. Pt accompanied by: self  PERTINENT HISTORY: MS   PAIN:  Are you having pain? Yes: NPRS scale: 2/10 Pain location:  bilateral hips esp right anterior hip Pain description: sore Aggravating factors: walking - feels like it might give out Relieving factors: rest  PRECAUTIONS: Fall  RED FLAGS: None   WEIGHT BEARING RESTRICTIONS: No  FALLS: Has patient fallen in last 6 months? No  LIVING ENVIRONMENT: Lives with: lives with their spouse Lives in: House/apartment Stairs: 4 about home Has following equipment at home: Environmental Consultant - 4 wheeled, Shower bench, Grab bars, and modified vehicle with hand controls, scooter  PLOF: Independent with community mobility with device  PATIENT GOALS: improve balance; walk better, be able to stand and cook  OBJECTIVE:  Note: Objective measures were completed at Evaluation unless otherwise noted.  COGNITION: Overall cognitive status: Within functional limits for tasks assessed   SENSATION: impaired  COORDINATION: LE impaired  MUSCLE TONE: LE impaired  MUSCLE LENGTH: hyper knees into ext  DTRs: TBT  POSTURE: anterior pelvic tilt, right pelvic obliquity, weight shift left, and bilateral hyperextension of knees  LOWER EXTREMITY ROM:     Wfl  LOWER EXTREMITY MMT:    MMT Right Eval Left Eval  Hip flexion 3- 3-  Hip extension    Hip abduction    Hip adduction    Hip internal rotation    Hip external rotation    Knee flexion 3 3  Knee extension 3 3  Ankle dorsiflexion 2 3  Ankle plantarflexion    Ankle inversion    Ankle eversion    (Blank rows = not tested)  BED MOBILITY:  indep  TRANSFERS: Sit to stand: Modified independence  Assistive device utilized: Environmental Consultant - 4 wheeled    heavy ue support   STAIRS: 4 steps occasionally used with hand rails indep GAIT: Findings: Gait Characteristics: Right hip hike, genu recurvatum- Right, genu recurvatum- Left, trendelenburg, and trunk flexed, Distance walked: 500 ft, Assistive device utilized:Walker - 4 wheeled, Level of assistance: Modified independence  FUNCTIONAL TESTS:  Timed up and go (TUG):  46.46 2 MWT128 ft using rollator 4 stage balance: FT close supervision; semi tandem, tandem and SLS unable to complete  PATIENT SURVEYS:  tba                                                                                                                              TREATMENT   -Supine figure-4 stretch 2x45 seconds each LE; tried piriformis stretch w/ no significant tightness reported -LTRs x20 -IR/ER in hook-lying x20 -Active assisted SLR w/ strap x5 each LE, improved discomfort of the R hip w/ PT manually preventing IR, but pain increases w/ repetition so placed MHP on anterior R hip x10 minutes to help return to baseline no pain -Butterfly stretch x2 minutes  PATIENT EDUCATION: Education details:  Continue pain management techniques at home.  Continue HEP w/ stretch additions today. Person educated: Patient Education method: Explanation Education comprehension: verbalized understanding  HOME EXERCISE PROGRAM: Access Code: [email or web address] URL: [email or web address] Date: 02/05/2024 Prepared by: Daved Bull  Exercises - Corner Balance Feet Together With Eyes Open  - 1 x daily - 4 x weekly - 1 sets - 2-3 reps - 30 seconds hold - Standing  Near Stance in [place] with Eyes Closed  - 1 x daily - 4 x weekly - 1 sets - 2-3 reps - 30 seconds hold - Heel Raises with Counter Support  - 1 x daily - 4 x weekly - 1-2 sets - 5-6 reps - Squat with Chair Touch  - 1 x daily - 4 x weekly - 2 sets - 10 reps - Side Stepping with Resistance at Thighs and Counter Support  - 1 x daily - 4 x weekly - 3 sets - 10 reps  GOALS: Goals reviewed with patient? Yes  SHORT TERM GOALS: Target date: 02/21/24  Pt will tolerate full aquatic sessions consistently without increase in pain and with improving function to demonstrate good toleration and effectiveness of intervention.  Baseline:  tolerating well (12/8) Goal status: MET    LONG TERM GOALS: Target date: 03/18/25  Pt will be indep with  final Aquatic HEP for continued management of condition Baseline:  Goal status: INITIAL  2.  Pt will improve to at least 157ft  to demonstrate improved ability to ambulate into and out of MD offices/clinics. (MCD = 65ft) Baseline: 128 ft Goal status: INITIAL  3.  Pt will improve on Tug test to <or=  40s to demonstrate improvement in lower extremity function, mobility and decreased fall risk. Baseline: 46.46 Goal status: INITIAL  4.  Pt will report improved walking ability and balance with daily function. Baseline:  Goal status: INITIAL  5.  Pt will demonstrate mindfulness with posture for improved alignment Baseline:  Goal status: INITIAL    ASSESSMENT:  CLINICAL IMPRESSION: Land sessions continue to be limited by anterior and medial right hip pain.  This pain is most provoked with IR and adduction of the LE which she has difficulty actively preventing.  She has some relief when PT manually prevents collapse into IR and adducted position when transitioning movements.  She continues to have a positive response to heat modalities so encouraged continued utilization to manage pain at home.  Will further address balance and strength in aquatic setting at appt tomorrow.  Will continue per POC.  OBJECTIVE IMPAIRMENTS: Abnormal gait, decreased activity tolerance, decreased balance, decreased coordination, decreased endurance, decreased mobility, difficulty walking, decreased strength, impaired tone, and postural dysfunction.   ACTIVITY LIMITATIONS: carrying, lifting, bending, sitting, standing, squatting, stairs, transfers, and locomotion level  PARTICIPATION LIMITATIONS: meal prep, cleaning, laundry, shopping, community activity, yard work, and cooking  PERSONAL FACTORS: Time since onset of injury/illness/exacerbation are also affecting patient's functional outcome.   REHAB POTENTIAL: Fair -good for stated goals  CLINICAL DECISION MAKING: Stable/uncomplicated  EVALUATION  COMPLEXITY: Low  PLAN:  PT FREQUENCY: 1-2x/week  PT DURATION: 6 weeks  PLANNED INTERVENTIONS: 02835- PT Re-evaluation, 97750- Physical Performance Testing, 97110-Therapeutic exercises, 97530- Therapeutic activity, [id number]- Neuromuscular re-education, 97535- Self Care, 02859- Manual therapy, [phone]- Gait training, [phone]- Aquatic Therapy, [phone]- Ionotophoresis 4mg /ml Dexamethasone , 79439 (1-2 muscles), [id number] (3+ muscles)- Dry Needling, Patient/Family education, Balance training, Stair training, Taping, Joint mobilization, DME instructions, Cryotherapy, and Moist heat  PLAN FOR NEXT SESSION: aquatics: strengthening; balance retraining, gait HEP; TPDN at end of current POC - schedule 12/30 at 245 w/ Taylor blocked  Aquatic HEP; standing tolerance and BLE strength, leg press, bilateral stance training/static balance; SLS - modified tree pose, warrior poses, stair step to offset squat  Daved Bull, PT, DPT

## 2024-03-10 ENCOUNTER — Encounter: Payer: Self-pay | Admitting: Physical Therapy

## 2024-03-10 ENCOUNTER — Ambulatory Visit: Payer: Self-pay | Admitting: Physical Therapy

## 2024-03-10 DIAGNOSIS — R2681 Unsteadiness on feet: Secondary | ICD-10-CM

## 2024-03-10 DIAGNOSIS — M6281 Muscle weakness (generalized): Secondary | ICD-10-CM

## 2024-03-10 DIAGNOSIS — R262 Difficulty in walking, not elsewhere classified: Secondary | ICD-10-CM

## 2024-03-10 NOTE — Therapy (Signed)
 OUTPATIENT PHYSICAL THERAPY NEURO TREATMENT/10th Visit PN   Patient Name: Kayla [name] MRN: [id number] DOB:[birth year], 73 y.o., female Today's Date: 03/10/2024   PCP: Debby FREDRIK Molt MD REFERRING PROVIDER: Murl Cough, MD   PT progress note for Kayla [name].  Reporting period 02/02/2024 to 03/10/2024  See Note below for Objective Data and Assessment of Progress/Goals  Thank you for the referral of this patient. Daved Bull, PT, DPT  END OF SESSION:  PT End of Session - 03/10/24 1024     Visit Number 10    Number of Visits 13   12 + eval   Date for Recertification  03/18/24    Authorization Type mcr    Progress Note Due on Visit 10    PT Start Time 1017    PT Stop Time 1105    PT Time Calculation (min) 48 min    Equipment Utilized During Treatment Other (comment)   Aquatic devices as needed for safety and challenge   Activity Tolerance Patient tolerated treatment well    Behavior During Therapy WFL for tasks assessed/performed          Past Medical History:  Diagnosis Date   Arthritis    oa   Asthma    Bilateral vocal cord paralysis    Bronchiectasis (HCC)    Cervical cancer (HCC) 2005   Depression    GERD (gastroesophageal reflux disease)    Hiatal hernia    Hypothyroidism    Immunodeficiency    Gets IVIG infusions   Insomnia    Multiple sclerosis 1996   Osteoporosis    Pneumonia 2024, 2014, 1992   Urine retention    Past Surgical History:  Procedure Laterality Date   ABDOMINAL HYSTERECTOMY  03/25/2003   for cervical cancer, complete   BRONCHIAL BIOPSY  10/26/2020   Procedure: BRONCHIAL BIOPSIES;  Surgeon: Brenna Adine CROME, DO;  Location: [facility];  Service: Pulmonary;;   BRONCHIAL BRUSHINGS  10/26/2020   Procedure: BRONCHIAL BRUSHINGS;  Surgeon: Brenna Adine CROME, DO;  Location: [facility];  Service: Pulmonary;;   BRONCHIAL WASHINGS  10/26/2020   Procedure: BRONCHIAL WASHINGS;  Surgeon: Brenna Adine CROME, DO;  Location: [facility];  Service: Pulmonary;;   BRONCHIAL WASHINGS  08/19/2022   Procedure: BRONCHIAL WASHINGS;  Surgeon: Brenna Adine CROME, DO;  Location: [facility];  Service: Cardiopulmonary;;   CATARACT EXTRACTION, BILATERAL Bilateral    lens for cataracts   CONVERSION TO TOTAL HIP Right 09/18/2015   Procedure: CONVERSION OF RIGHT HEMI ARTHROPLASTY TO TOTAL HIP ARTHROPLASTY ACETABULAR REVISON ;  Surgeon: Cough Car, MD;  Location: [facility];  Service: Orthopedics;  Laterality: Right;   HIP ARTHROPLASTY Right 03/01/2013   Procedure: ARTHROPLASTY BIPOLAR HIP;  Surgeon: Cough JONETTA Car, MD;  Location: [facility];  Service: Orthopedics;  Laterality: Right;   HIP CLOSED REDUCTION Right 03/12/2013   Procedure: CLOSED REDUCTION HIP;  Surgeon: Dempsey LULLA Moan, MD;  Location: [facility];  Service: Orthopedics;  Laterality: Right;   HIP CLOSED REDUCTION Right 03/19/2013   Procedure: CLOSED REDUCTION HIP;  Surgeon: Reyes JAYSON Billing, MD;  Location: [facility];  Service: Orthopedics;  Laterality: Right;   HIP CLOSED REDUCTION Right 11/10/2015   Procedure: CLOSED MANIPULATION HIP;  Surgeon: Redell Shoals, MD;  Location: [facility];  Service: Orthopedics;  Laterality: Right;   HIP CLOSED REDUCTION Right 06/05/2017   Procedure: CLOSED REDUCTION HIP;  Surgeon: Heide Ingle, MD;  Location: [facility];  Service: Orthopedics;  Laterality: Right;   SMALL INTESTINE SURGERY  TEE WITHOUT CARDIOVERSION N/A 09/22/2022   Procedure: TRANSESOPHAGEAL ECHOCARDIOGRAM;  Surgeon: Okey Vina GAILS, MD;  Location: [facility];  Service: Cardiovascular;  Laterality: N/A;   TOTAL HIP REVISION Right 06/06/2017   Procedure: TOTAL HIP REVISION;  Surgeon: Heide Ingle, MD;  Location: [facility];  Service: Orthopedics;  Laterality: Right;   VESICOVAGINAL FISTULA CLOSURE W/ TAH  03/25/2003   VIDEO BRONCHOSCOPY N/A 10/26/2020   Procedure: VIDEO BRONCHOSCOPY WITH FLUORO;  Surgeon: Brenna Adine CROME, DO;  Location: [facility];  Service: Pulmonary;  Laterality:  N/A;   VIDEO BRONCHOSCOPY Left 08/19/2022   Procedure: VIDEO BRONCHOSCOPY WITH FLUORO; WITH BAL;  Surgeon: Brenna Adine CROME, DO;  Location: [facility];  Service: Cardiopulmonary;  Laterality: Left;   Patient Active Problem List   Diagnosis Date Noted   Neuropathy 11/14/2022   Mild protein malnutrition 10/18/2022   Chronic respiratory failure with hypoxia (HCC) 09/29/2022   Hypogammaglobulinemia 09/22/2022   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) 07/08/2022   GERD (gastroesophageal reflux disease) 06/20/2022   Chronic obstructive pulmonary disease (HCC) 09/24/2021   Pulmonary mycobacterial infection (HCC) 09/24/2021   Bilateral vocal cord paralysis 09/24/2021   UTI (urinary tract infection) 05/24/2021   COPD (chronic obstructive pulmonary disease) (HCC)    Iron deficiency anemia secondary to inadequate dietary iron intake 12/20/2020   Purple toe syndrome of both feet (HCC) 07/26/2020   Cough variant asthma vs vcd  related to MS  11/10/2016   Positional sleep apnea 09/10/2016   Periodic limb movements of sleep 09/10/2016   OSA on CPAP 02/08/2016   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/21/2013   Constipation 02/26/2013   Hyponatremia 05/20/2012   Post-menopausal osteoporosis 09/17/2007   Hypothyroidism 04/01/2007   Multiple sclerosis 04/01/2007     ONSET DATE: chronic  REFERRING DIAG:  G35 (ICD-10-CM) - Multiple sclerosis  G82.50 (ICD-10-CM) - Quadriplegia, unspecified    THERAPY DIAG:  Muscle weakness (generalized)  Unsteadiness on feet  Difficulty in walking, not elsewhere classified  Rationale for Evaluation and Treatment: Rehabilitation  SUBJECTIVE:                                                                                                                                                                                             SUBJECTIVE STATEMENT: She presents ambulatory w/ rollator to [facility] for aquatics having same pain as usual. Pt  accompanied by: self  PERTINENT HISTORY: MS   PAIN:  Are you having pain? Yes: NPRS scale: 2/10 Pain location: bilateral hips esp right anterior hip Pain description: sore Aggravating factors: walking - feels like it might give out Relieving factors: rest  PRECAUTIONS: Fall  RED FLAGS: None   WEIGHT BEARING RESTRICTIONS: No  FALLS: Has patient fallen in last 6 months? No  LIVING ENVIRONMENT: Lives with: lives with their spouse Lives in: House/apartment Stairs: 4 about home Has following equipment at home: Environmental Consultant - 4 wheeled, Shower bench, Grab bars, and modified vehicle with hand controls, scooter  PLOF: Independent with community mobility with device  PATIENT GOALS: improve balance; walk better, be able to stand and cook  OBJECTIVE:  Note: Objective measures were completed at Evaluation unless otherwise noted.  COGNITION: Overall cognitive status: Within functional limits for tasks assessed   SENSATION: impaired  COORDINATION: LE impaired  MUSCLE TONE: LE impaired  MUSCLE LENGTH: hyper knees into ext  DTRs: TBT  POSTURE: anterior pelvic tilt, right pelvic obliquity, weight shift left, and bilateral hyperextension of knees  LOWER EXTREMITY ROM:     Wfl  LOWER EXTREMITY MMT:    MMT Right Eval Left Eval  Hip flexion 3- 3-  Hip extension    Hip abduction    Hip adduction    Hip internal rotation    Hip external rotation    Knee flexion 3 3  Knee extension 3 3  Ankle dorsiflexion 2 3  Ankle plantarflexion    Ankle inversion    Ankle eversion    (Blank rows = not tested)  BED MOBILITY:  indep  TRANSFERS: Sit to stand: Modified independence  Assistive device utilized: Environmental Consultant - 4 wheeled    heavy ue support   STAIRS: 4 steps occasionally used with hand rails indep GAIT: Findings: Gait Characteristics: Right hip hike, genu recurvatum- Right, genu recurvatum- Left, trendelenburg, and trunk flexed, Distance walked: 500 ft, Assistive device  utilized:Walker - 4 wheeled, Level of assistance: Modified independence  FUNCTIONAL TESTS:  Timed up and go (TUG): 46.46 2 MWT128 ft using rollator 4 stage balance: FT close supervision; semi tandem, tandem and SLS unable to complete  PATIENT SURVEYS:  tba                                                                                                                              TREATMENT  Aquatic therapy at [facility] - pool temperature 90 degrees   Patient seen for aquatic therapy today.  Treatment took place in water  3.6-4.8 feet deep depending upon activity.  Patient entered and exited the pool via stairs using 2 hands to a rail and step to pattern at mod I level.   Exercises: -Water  walking warmup: forward > laterally > backwards 4x18 ft each unsupported -Squats 2x10 in shallow water ; cues to correct RLE posture and prevent R bias in squat form > 6 offset squat w/ BUE support 2x10 each LE working on symmetrical trunk position in squat to improve bilateral weight bearing -6 step off lunges w/ BUE support 2x10 each LE -Modified warrior 2 w/ noodle x5 w/ 5 second hold each -Warrior 3 w/ noodle support x5 each LE holding 2-3 sec,  better symmetry today  Patient requires buoyancy of the water  for support for reduced fall risk with gait training and balance exercises with return demo and moderate cues to improve trunk and LE form. Exercises able to be performed safely in water  without the risk of fall compared to those same exercises performed on land; viscosity of water  needed for resistance for strengthening. Current of water  provides perturbations for challenging static and dynamic balance.    PATIENT EDUCATION: Education details:  Continue pain management techniques at home.  Continue HEP.  Continue Ai [employer], plan for aquatic HEP.  Briefly discussed plan for re-cert. Person educated: Patient Education method: Explanation Education comprehension: verbalized understanding  HOME  EXERCISE PROGRAM: Access Code: [email or web address] URL: [email or web address] Date: 02/05/2024 Prepared by: Daved Bull  Exercises - Corner Balance Feet Together With Eyes Open  - 1 x daily - 4 x weekly - 1 sets - 2-3 reps - 30 seconds hold - Standing Near Stance in [place] with Eyes Closed  - 1 x daily - 4 x weekly - 1 sets - 2-3 reps - 30 seconds hold - Heel Raises with Counter Support  - 1 x daily - 4 x weekly - 1-2 sets - 5-6 reps - Squat with Chair Touch  - 1 x daily - 4 x weekly - 2 sets - 10 reps - Side Stepping with Resistance at Thighs and Counter Support  - 1 x daily - 4 x weekly - 3 sets - 10 reps  GOALS: Goals reviewed with patient? Yes  SHORT TERM GOALS: Target date: 02/21/24  Pt will tolerate full aquatic sessions consistently without increase in pain and with improving function to demonstrate good toleration and effectiveness of intervention.  Baseline:  tolerating well (12/8) Goal status: MET    LONG TERM GOALS: Target date: 03/18/25  Pt will be indep with final Aquatic HEP for continued management of condition Baseline:  Goal status: INITIAL  2.  Pt will improve to at least 166ft  to demonstrate improved ability to ambulate into and out of MD offices/clinics. (MCD = 10ft) Baseline: 128 ft Goal status: INITIAL  3.  Pt will improve on Tug test to <or=  40s to demonstrate improvement in lower extremity function, mobility and decreased fall risk. Baseline: 46.46 Goal status: INITIAL  4.  Pt will report improved walking ability and balance with daily function. Baseline:  Goal status: INITIAL  5.  Pt will demonstrate mindfulness with posture for improved alignment Baseline:  Goal status: INITIAL    ASSESSMENT:  CLINICAL IMPRESSION: Focus of skilled aquatic session today on continuing to address SL stability an isolating LE strengthening.  She has R bias due to natural IR/adduction preference of the RLE so time spent correcting this with squat  and lunge variations.  She continues to have hip pain in the medial aspect of right hip.  Will further assess LTGs and determine POC moving forward at second of 2 upcoming land appts. Will continue per POC.  OBJECTIVE IMPAIRMENTS: Abnormal gait, decreased activity tolerance, decreased balance, decreased coordination, decreased endurance, decreased mobility, difficulty walking, decreased strength, impaired tone, and postural dysfunction.   ACTIVITY LIMITATIONS: carrying, lifting, bending, sitting, standing, squatting, stairs, transfers, and locomotion level  PARTICIPATION LIMITATIONS: meal prep, cleaning, laundry, shopping, community activity, yard work, and cooking  PERSONAL FACTORS: Time since onset of injury/illness/exacerbation are also affecting patient's functional outcome.   REHAB POTENTIAL: Fair -good for stated goals  CLINICAL DECISION MAKING: Stable/uncomplicated  EVALUATION COMPLEXITY: Low  PLAN:  PT  FREQUENCY: 1-2x/week  PT DURATION: 6 weeks  PLANNED INTERVENTIONS: 02835- PT Re-evaluation, 97750- Physical Performance Testing, 97110-Therapeutic exercises, 97530- Therapeutic activity, [id number]- Neuromuscular re-education, 97535- Self Care, 02859- Manual therapy, [phone]- Gait training, [phone]- Aquatic Therapy, [phone]- Ionotophoresis 4mg /ml Dexamethasone , 79439 (1-2 muscles), 20561 (3+ muscles)- Dry Needling, Patient/Family education, Balance training, Stair training, Taping, Joint mobilization, DME instructions, Cryotherapy, and Moist heat  PLAN FOR NEXT SESSION: aquatics: strengthening; balance retraining, gait HEP; TPDN at end of current POC - schedule 12/30 at 245 w/ Taylor blocked; re-cert at last scheduled visit - freq?  Aquatic HEP; standing tolerance and BLE strength, leg press, bilateral stance training/static balance; SLS - modified tree pose, warrior poses  Daved Bull, PT, DPT

## 2024-03-11 LAB — HM MAMMOGRAPHY

## 2024-03-14 ENCOUNTER — Encounter: Payer: Self-pay | Admitting: Internal Medicine

## 2024-03-15 ENCOUNTER — Encounter: Payer: Self-pay | Admitting: Physical Therapy

## 2024-03-15 ENCOUNTER — Ambulatory Visit: Payer: Self-pay | Admitting: Physical Therapy

## 2024-03-15 DIAGNOSIS — R2681 Unsteadiness on feet: Secondary | ICD-10-CM

## 2024-03-15 DIAGNOSIS — M6281 Muscle weakness (generalized): Secondary | ICD-10-CM

## 2024-03-15 DIAGNOSIS — R262 Difficulty in walking, not elsewhere classified: Secondary | ICD-10-CM

## 2024-03-15 NOTE — Therapy (Signed)
 " OUTPATIENT PHYSICAL THERAPY NEURO TREATMENT   Patient Name: Kayla [name] MRN: [id number] DOB:11/12/1950, 73 y.o., female Today's Date: 03/15/2024   PCP: Debby FREDRIK Molt MD REFERRING PROVIDER: Murl Cough, MD   END OF SESSION:  PT End of Session - 03/15/24 1156     Visit Number 11    Number of Visits 13   12 + eval   Date for Recertification  03/18/24    Authorization Type mcr    Progress Note Due on Visit 10    PT Start Time 1155    PT Stop Time 1235    PT Time Calculation (min) 40 min    Equipment Utilized During Treatment Gait belt    Activity Tolerance Patient tolerated treatment well    Behavior During Therapy WFL for tasks assessed/performed          Past Medical History:  Diagnosis Date   Arthritis    oa   Asthma    Bilateral vocal cord paralysis    Bronchiectasis (HCC)    Cervical cancer (HCC) 2005   Depression    GERD (gastroesophageal reflux disease)    Hiatal hernia    Hypothyroidism    Immunodeficiency    Gets IVIG infusions   Insomnia    Multiple sclerosis 1996   Osteoporosis    Pneumonia 2024, 2014, 1992   Urine retention    Past Surgical History:  Procedure Laterality Date   ABDOMINAL HYSTERECTOMY  03/25/2003   for cervical cancer, complete   BRONCHIAL BIOPSY  10/26/2020   Procedure: BRONCHIAL BIOPSIES;  Surgeon: Brenna Adine CROME, DO;  Location: [facility];  Service: Pulmonary;;   BRONCHIAL BRUSHINGS  10/26/2020   Procedure: BRONCHIAL BRUSHINGS;  Surgeon: Brenna Adine CROME, DO;  Location: [facility];  Service: Pulmonary;;   BRONCHIAL WASHINGS  10/26/2020   Procedure: BRONCHIAL WASHINGS;  Surgeon: Brenna Adine CROME, DO;  Location: [facility];  Service: Pulmonary;;   BRONCHIAL WASHINGS  08/19/2022   Procedure: BRONCHIAL WASHINGS;  Surgeon: Brenna Adine CROME, DO;  Location: [facility];  Service: Cardiopulmonary;;   CATARACT EXTRACTION, BILATERAL Bilateral    lens for cataracts   CONVERSION TO TOTAL HIP Right 09/18/2015    Procedure: CONVERSION OF RIGHT HEMI ARTHROPLASTY TO TOTAL HIP ARTHROPLASTY ACETABULAR REVISON ;  Surgeon: Cough Car, MD;  Location: [facility];  Service: Orthopedics;  Laterality: Right;   HIP ARTHROPLASTY Right 03/01/2013   Procedure: ARTHROPLASTY BIPOLAR HIP;  Surgeon: Cough JONETTA Car, MD;  Location: [facility];  Service: Orthopedics;  Laterality: Right;   HIP CLOSED REDUCTION Right 03/12/2013   Procedure: CLOSED REDUCTION HIP;  Surgeon: Dempsey LULLA Moan, MD;  Location: [facility];  Service: Orthopedics;  Laterality: Right;   HIP CLOSED REDUCTION Right 03/19/2013   Procedure: CLOSED REDUCTION HIP;  Surgeon: Reyes JAYSON Billing, MD;  Location: [facility];  Service: Orthopedics;  Laterality: Right;   HIP CLOSED REDUCTION Right 11/10/2015   Procedure: CLOSED MANIPULATION HIP;  Surgeon: Redell Shoals, MD;  Location: [facility];  Service: Orthopedics;  Laterality: Right;   HIP CLOSED REDUCTION Right 06/05/2017   Procedure: CLOSED REDUCTION HIP;  Surgeon: Heide Ingle, MD;  Location: [facility];  Service: Orthopedics;  Laterality: Right;   SMALL INTESTINE SURGERY     TEE WITHOUT CARDIOVERSION N/A 09/22/2022   Procedure: TRANSESOPHAGEAL ECHOCARDIOGRAM;  Surgeon: Okey Vina LULLA, MD;  [facility];  Service: Cardiovascular;  Laterality: N/A;   TOTAL HIP REVISION Right 06/06/2017   Procedure: TOTAL HIP REVISION;  Surgeon: Heide Ingle,  MD;  Location: [facility];  Service: Orthopedics;  Laterality: Right;   VESICOVAGINAL FISTULA CLOSURE W/ TAH  03/25/2003   VIDEO BRONCHOSCOPY N/A 10/26/2020   Procedure: VIDEO BRONCHOSCOPY WITH FLUORO;  Surgeon: Brenna Adine CROME, DO;  Location: [facility];  Service: Pulmonary;  Laterality: N/A;   VIDEO BRONCHOSCOPY Left 08/19/2022   Procedure: VIDEO BRONCHOSCOPY WITH FLUORO; WITH BAL;  Surgeon: Brenna Adine CROME, DO;  Location: [facility];  Service: Cardiopulmonary;  Laterality: Left;   Patient Active Problem List   Diagnosis Date Noted   Neuropathy 11/14/2022   Mild protein  malnutrition 10/18/2022   Chronic respiratory failure with hypoxia (HCC) 09/29/2022   Hypogammaglobulinemia 09/22/2022   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) 07/08/2022   GERD (gastroesophageal reflux disease) 06/20/2022   Chronic obstructive pulmonary disease (HCC) 09/24/2021   Pulmonary mycobacterial infection (HCC) 09/24/2021   Bilateral vocal cord paralysis 09/24/2021   UTI (urinary tract infection) 05/24/2021   COPD (chronic obstructive pulmonary disease) (HCC)    Iron deficiency anemia secondary to inadequate dietary iron intake 12/20/2020   Purple toe syndrome of both feet (HCC) 07/26/2020   Cough variant asthma vs vcd  related to MS  11/10/2016   Positional sleep apnea 09/10/2016   Periodic limb movements of sleep 09/10/2016   OSA on CPAP 02/08/2016   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/21/2013   Constipation 02/26/2013   Hyponatremia 05/20/2012   Post-menopausal osteoporosis 09/17/2007   Hypothyroidism 04/01/2007   Multiple sclerosis 04/01/2007     ONSET DATE: chronic  REFERRING DIAG:  G35 (ICD-10-CM) - Multiple sclerosis  G82.50 (ICD-10-CM) - Quadriplegia, unspecified    THERAPY DIAG:  Muscle weakness (generalized)  Unsteadiness on feet  Difficulty in walking, not elsewhere classified  Rationale for Evaluation and Treatment: Rehabilitation  SUBJECTIVE:                                                                                                                                                                                             SUBJECTIVE STATEMENT: She presents ambulatory w/ 2WW reporting ongoing low level right hip pain.  She is still interested in TPDN next session.  She would like to go ahead and schedule aquatic sessions for re-cert. Pt accompanied by: self  PERTINENT HISTORY: MS   PAIN:  Are you having pain? Yes: NPRS scale: 2/10 Pain location: bilateral hips esp right anterior hip Pain description: sore Aggravating  factors: walking - feels like it might give out Relieving factors: rest  PRECAUTIONS: Fall  RED FLAGS: None   WEIGHT BEARING RESTRICTIONS: No  FALLS: Has patient fallen in last 6 months? No  LIVING ENVIRONMENT:  Lives with: lives with their spouse Lives in: House/apartment Stairs: 4 about home Has following equipment at home: Environmental Consultant - 4 wheeled, Shower bench, Grab bars, and modified vehicle with hand controls, scooter  PLOF: Independent with community mobility with device  PATIENT GOALS: improve balance; walk better, be able to stand and cook  OBJECTIVE:  Note: Objective measures were completed at Evaluation unless otherwise noted.  COGNITION: Overall cognitive status: Within functional limits for tasks assessed   SENSATION: impaired  COORDINATION: LE impaired  MUSCLE TONE: LE impaired  MUSCLE LENGTH: hyper knees into ext  DTRs: TBT  POSTURE: anterior pelvic tilt, right pelvic obliquity, weight shift left, and bilateral hyperextension of knees  LOWER EXTREMITY ROM:     Wfl  LOWER EXTREMITY MMT:    MMT Right Eval Left Eval  Hip flexion 3- 3-  Hip extension    Hip abduction    Hip adduction    Hip internal rotation    Hip external rotation    Knee flexion 3 3  Knee extension 3 3  Ankle dorsiflexion 2 3  Ankle plantarflexion    Ankle inversion    Ankle eversion    (Blank rows = not tested)  BED MOBILITY:  indep  TRANSFERS: Sit to stand: Modified independence  Assistive device utilized: Environmental Consultant - 4 wheeled    heavy ue support   STAIRS: 4 steps occasionally used with hand rails indep GAIT: Findings: Gait Characteristics: Right hip hike, genu recurvatum- Right, genu recurvatum- Left, trendelenburg, and trunk flexed, Distance walked: 500 ft, Assistive device utilized:Walker - 4 wheeled, Level of assistance: Modified independence  FUNCTIONAL TESTS:  Timed up and go (TUG): 46.46 2 MWT128 ft using rollator 4 stage balance: FT close supervision; semi  tandem, tandem and SLS unable to complete  PATIENT SURVEYS:  tba                                                                                                                              TREATMENT  -Spanish squats 2x15 w/ BUE support, cues for midline squatting -Resisted 2 step ups x20 alt LE -Resisted forward and retro gait w/ BUE support 4x10 ft -Resisted lateral gait 4x10 ft each direction -Resisted gait w/ 2WW x130 ft  PATIENT EDUCATION: Education details:  Continue pain management techniques at home.  Continue HEP.  Continue Ai Chi, plan for aquatic HEP.  Discussed plan for re-cert, frequency and aquatic only focus based on benefit and scheduling.  TPDN info. Person educated: Patient Education method: Explanation Education comprehension: verbalized understanding  HOME EXERCISE PROGRAM: Access Code: [email or web address] URL: [email or web address] Date: 02/05/2024 Prepared by: Daved Bull  Exercises - Corner Balance Feet Together With Eyes Open  - 1 x daily - 4 x weekly - 1 sets - 2-3 reps - 30 seconds hold - Standing Near Stance in [place] with Eyes Closed  - 1 x daily - 4 x weekly - 1 sets - 2-3 reps - 30 seconds hold -  Heel Raises with Counter Support  - 1 x daily - 4 x weekly - 1-2 sets - 5-6 reps - Squat with Chair Touch  - 1 x daily - 4 x weekly - 2 sets - 10 reps - Side Stepping with Resistance at Thighs and Counter Support  - 1 x daily - 4 x weekly - 3 sets - 10 reps  GOALS: Goals reviewed with patient? Yes  SHORT TERM GOALS: Target date: 02/21/24  Pt will tolerate full aquatic sessions consistently without increase in pain and with improving function to demonstrate good toleration and effectiveness of intervention.  Baseline:  tolerating well (12/8) Goal status: MET    LONG TERM GOALS: Target date: 03/18/25  Pt will be indep with final Aquatic HEP for continued management of condition Baseline:  Goal status: INITIAL  2.  Pt will improve to  at least 124ft  to demonstrate improved ability to ambulate into and out of MD offices/clinics. (MCD = 41ft) Baseline: 128 ft Goal status: INITIAL  3.  Pt will improve on Tug test to <or=  40s to demonstrate improvement in lower extremity function, mobility and decreased fall risk. Baseline: 46.46 Goal status: INITIAL  4.  Pt will report improved walking ability and balance with daily function. Baseline:  Goal status: INITIAL  5.  Pt will demonstrate mindfulness with posture for improved alignment Baseline:  Goal status: INITIAL    ASSESSMENT:  CLINICAL IMPRESSION: Focus of skilled land session today on progression to dynamic balance and strengthening.  Utilizing resisted gait for improved power production of the BLE.  She continues to have severe right LE in-toeing w/ transfers and gait and is not wearing Bioness this visit.  Compared to prior visits w/ Bioness, mechanics do not appear significantly different.  Plan to re-cert at final scheduled land visit and continue with aquatics only to progress to independence with higher level aquatic HEP as pt has had most benefit and ability to participate with this modality.  Will also trial TPDN at next visit for R hip pain management. Will continue per POC.  OBJECTIVE IMPAIRMENTS: Abnormal gait, decreased activity tolerance, decreased balance, decreased coordination, decreased endurance, decreased mobility, difficulty walking, decreased strength, impaired tone, and postural dysfunction.   ACTIVITY LIMITATIONS: carrying, lifting, bending, sitting, standing, squatting, stairs, transfers, and locomotion level  PARTICIPATION LIMITATIONS: meal prep, cleaning, laundry, shopping, community activity, yard work, and cooking  PERSONAL FACTORS: Time since onset of injury/illness/exacerbation are also affecting patient's functional outcome.   REHAB POTENTIAL: Fair -good for stated goals  CLINICAL DECISION MAKING: Stable/uncomplicated  EVALUATION  COMPLEXITY: Low  PLAN:  PT FREQUENCY: 1-2x/week  PT DURATION: 6 weeks  PLANNED INTERVENTIONS: 02835- PT Re-evaluation, 97750- Physical Performance Testing, 97110-Therapeutic exercises, 97530- Therapeutic activity, [id number]- Neuromuscular re-education, 97535- Self Care, 02859- Manual therapy, [phone]- Gait training, [phone]- Aquatic Therapy, [phone]- Ionotophoresis 4mg /ml Dexamethasone , 79439 (1-2 muscles), [id number] (3+ muscles)- Dry Needling, Patient/Family education, Balance training, Stair training, Taping, Joint mobilization, DME instructions, Cryotherapy, and Moist heat  PLAN FOR NEXT SESSION: aquatics: strengthening; balance retraining; TPDN at end of current POC - schedule 12/30 at 245 w/ Taylor blocked; re-cert at last scheduled visit - 1x/wk for 6 wks pool only (does she want DN again - may need 1-2x/wk in event need to schedule a land appt for this!)  Aquatic HEP; standing tolerance and BLE strength, leg press, bilateral stance training/static balance; SLS - modified tree pose, warrior poses  Daved Bull, PT, DPT          "

## 2024-03-22 ENCOUNTER — Ambulatory Visit: Admitting: Physical Therapy

## 2024-03-23 ENCOUNTER — Ambulatory Visit: Admitting: Nurse Practitioner

## 2024-03-28 ENCOUNTER — Encounter: Payer: Self-pay | Admitting: Primary Care

## 2024-03-28 ENCOUNTER — Ambulatory Visit: Admitting: Primary Care

## 2024-03-28 VITALS — BP 120/62 | HR 83 | Temp 97.5°F | Ht 63.0 in | Wt 123.4 lb

## 2024-03-28 DIAGNOSIS — J9611 Chronic respiratory failure with hypoxia: Secondary | ICD-10-CM

## 2024-03-28 DIAGNOSIS — J449 Chronic obstructive pulmonary disease, unspecified: Secondary | ICD-10-CM

## 2024-03-28 DIAGNOSIS — G4733 Obstructive sleep apnea (adult) (pediatric): Secondary | ICD-10-CM

## 2024-03-28 DIAGNOSIS — J3802 Paralysis of vocal cords and larynx, bilateral: Secondary | ICD-10-CM | POA: Diagnosis not present

## 2024-03-28 DIAGNOSIS — G4700 Insomnia, unspecified: Secondary | ICD-10-CM | POA: Diagnosis not present

## 2024-03-28 NOTE — Patient Instructions (Signed)
" °  VISIT SUMMARY: You came in today for a routine check-in regarding your chronic respiratory failure and bilateral vocal cord paralysis. You reported feeling stronger since your last visit and have discontinued oxygen  therapy due to improved symptoms. You are currently using Yupelri  and Inframotorol nebulizer for your respiratory condition. You also shared that you have not had any recent hospitalizations or flare-ups of bronchitis and are continuing with physical therapy. Additionally, you are managing your multiple sclerosis with weekly IVIG infusions and find temazepam  helpful for sleep.  YOUR PLAN: -CHRONIC RESPIRATORY FAILURE WITH HYPOXIA: Chronic respiratory failure with hypoxia means your lungs are not getting enough oxygen  into your blood. You have stopped using oxygen  therapy three weeks ago due to feeling better. We will conduct an overnight oximetry test to check your current oxygen  levels while you sleep. Continue your current treatment plan without oxygen  therapy unless the test shows you need it.  -BILATERAL VOCAL CORD PARALYSIS: Bilateral vocal cord paralysis means both of your vocal cords are not moving properly, which can affect your voice and breathing. Your symptoms of hoarseness, throat clearing, and difficulty swallowing have improved over the past year. We will continue with your current management plan without specific treatment for your cough, as previous medications were not effective.  INSTRUCTIONS: Please complete the overnight oximetry test as planned to assess your oxygen  levels on room air. Continue with your current medications and physical therapy. Follow up with us  after the oximetry test to discuss the results and any necessary changes to your treatment plan.  Follow-up 6 months with Dr. Geronimo  "

## 2024-03-28 NOTE — Progress Notes (Signed)
 "  @Patient  ID: Kayla [name], female    DOB: [birth year], 74 y.o.   MRN: [id number]  No chief complaint on file.   Referring provider: Joshua Debby CROME, MD  HPI: 74 year old female.PMH significant for multiple sclerosis, chronic respiratory failure, vocal cord paralysis.   Previous [facility] pulmonary encounter:  09/30/2023 -   Chief Complaint  Patient presents with   Follow-up    Breathing is overall doing well. She has started only using her o2 with sleep- 4lpm.      HPI Kayla [name] 74 y.o. -multiple sclerosis patient with a history of Guevarra presents for follow-up.  Last seen in September 2024.  At that time E[facility] at [facility] recommended tracheostomy but she has since decided against it.  She says she is stronger. Interim Health status: No new complaints No new medical problems. No new surgeries. No ER visits. No Urgent care visits. No changes to medications.  She gets weekly IVIG at home.  She is on Yupelri  and formoterol  nebulizers.  She is not taking hypertonic saline.  No admissions to the hospital no pneumonia.  Using oxygen  only at night.  She is exercising.  She is working with PT.  She is also driving regular car.  She is doing water  aerobics.  No aspiration issues.  Last imaging was over the end of last year and personally visualized.  I decided against getting follow-up imaging.   Chronic respiratory failure with hypoxia (HCC)  J96.11        2. Bilateral vocal cord paralysis  J38.02       3. Multiple sclerosis (HCC)  G35       4. History of Guillain-Barre syndrome  Z86.69             Clinically doing well and doing ADLS including driving Respect your decision against tracheostomy   Plan - cotinue your nebulizers and oher medicines including weekly IvIG - Continue night oxygen   03/28/2024- Interim hx  Discussed the use of AI scribe software for clinical note transcription with the patient, who gave verbal consent to proceed.  History of Present Illness Kayla [name] is a 74 year old female with chronic respiratory failure and bilateral vocal cord paralysis who presents for a routine check-in.  She has chronic respiratory failure with hypoxia and bilateral vocal cord paralysis. Previously, a tracheostomy was recommended, but she opted against it. She feels stronger since her last visit and is currently on Yupelri  once a day and formoterol  nebulizer twice a day. She discontinued oxygen  therapy at night about three weeks ago due to improved symptoms.  There have been no recent hospitalizations or flare-ups of bronchitis or recurrent pneumonia. No issues with aspiration. She continues to exercise and drive and is currently working with physical therapy.  On the cough symptom scale, she rates her hoarseness as a 3, throat clearing as a 4, difficulty swallowing as a 3, and breathing difficulties as a 2. She experiences a sensation of something sticking in her throat, rated as a 3. No heartburn.  She has a history of multiple sclerosis and is not on any MS medication for the last two years, as she is receiving IVIG infusions once a week, indefinitely.  She takes temazepam  at bedtime, which she finds helpful for sleep.  Dr Felice Reflux Symptom Index (> 13- associated asthma and vocal cord paralysis.  15 suggestive of LPR cough) 0 -> 5  =  none ->severe problem 11/04/2021  03/28/2024  Hoarseness of problem with voice 3 3  Clearing  Of Throat 3 4  Excess throat mucus or feeling of post nasal drip 5 5  Difficulty swallowing food, liquid or tablets 5 3  Cough after eating or lying down 5 4  Breathing difficulties or choking episodes 5 2  Troublesome or annoying cough 5 5  Sensation of something sticking in throat or lump in throat 5 3  Heartburn, chest pain, indigestion, or stomach acid coming up - 0  TOTAL 36 29     Allergies[1]  Immunization History  Administered Date(s) Administered   DTP 04/10/2009   Fluad Quad(high Dose 65+) 12/29/2018,  01/29/2022   Fluad Trivalent(High Dose 65+) 12/09/2022   INFLUENZA, HIGH DOSE SEASONAL PF 02/23/2018, 12/23/2023   Influenza Split 12/10/2010, 02/11/2012, 12/22/2012, 01/02/2014, 12/22/2014   Influenza Whole 02/19/2005, 03/28/2009, 01/23/2016   Influenza,inj,Quad PF,6+ Mos 02/10/2017   Influenza-Unspecified 12/02/2018, 01/08/2019, 12/22/2019, 01/15/2021, 02/05/2021   Moderna Covid-19 Vaccine  Bivalent Booster 64yrs & up 02/05/2021, 01/20/2022   Moderna Sars-Covid-2 Vaccination 04/29/2019, 06/01/2019, 01/19/2020, 08/14/2020   PNEUMOCOCCAL CONJUGATE-20 10/22/2022   Pfizer(Comirnaty )Fall Seasonal Vaccine 12 years and older 09/23/2022, 12/28/2023   Pneumococcal Conjugate-13 11/06/2015   Pneumococcal Polysaccharide-23 03/28/2009, 04/26/2019   Respiratory Syncytial Virus Vaccine ,Recomb Aduvanted(Arexvy ) 12/26/2021   Tdap 04/26/2019, 06/19/2022   Zoster Recombinant(Shingrix) 09/07/2019, 02/17/2020    Past Medical History:  Diagnosis Date   Arthritis    oa   Asthma    Bilateral vocal cord paralysis    Bronchiectasis (HCC)    Cervical cancer (HCC) 2005   Depression    GERD (gastroesophageal reflux disease)    Hiatal hernia    Hypothyroidism    Immunodeficiency    Gets IVIG infusions   Insomnia    Multiple sclerosis 1996   Osteoporosis    Pneumonia 2024, 2014, 1992   Urine retention     Tobacco History: Tobacco Use History[2] Counseling given: Not Answered   Outpatient Medications Prior to Visit  Medication Sig Dispense Refill   albuterol  (PROVENTIL ) (2.5 MG/3ML) 0.083% nebulizer solution Take 3 mLs (2.5 mg total) by nebulization as directed. 180 mL 1   amphetamine -dextroamphetamine  (ADDERALL) 20 MG tablet Take 20 mg by mouth 2 (two) times daily.     azithromycin  (ZITHROMAX ) 250 MG tablet Take 1 tablet (250 mg total) by mouth daily. 30 each 5   baclofen  (LIORESAL ) 10 MG tablet Take 1 tablet (10 mg total) by mouth in the morning. 30 each 5   bisacodyl  (DULCOLAX) 5 MG EC tablet  Take 5 mg by mouth See admin instructions. Take 5 mg by mouth in the morning and evening     budesonide  (PULMICORT ) 0.5 MG/2ML nebulizer solution Take 2 mLs (0.5 mg total) by nebulization in the morning and at bedtime. Brush teeth after treatment. 360 mL 1   calcium  carbonate (OS-CAL - DOSED IN MG OF ELEMENTAL CALCIUM ) 1250 (500 Ca) MG tablet Take 1 tablet (1,250 mg total) by mouth daily with breakfast.     Coenzyme Q10 (VITALINE COQ10) 300 MG WAFR Take 300 mg by mouth in the morning.     dexlansoprazole  (DEXILANT ) 60 MG capsule Take 1 capsule (60 mg total) by mouth daily with breakfast. 90 capsule 0   famotidine  (PEPCID ) 20 MG tablet One after supper 30 tablet 11   Ferric Maltol  (ACCRUFER ) 30 MG CAPS Take 1 capsule (30 mg total) by mouth in the morning and at bedtime. 180 capsule 1   fluconazole  (DIFLUCAN ) 200 MG tablet Take 200 mg by mouth once  a week.     FLUoxetine  (PROZAC ) 40 MG capsule Take 1 capsule (40 mg total) by mouth daily. 90 capsule 0   formoterol  (PERFOROMIST ) 20 MCG/2ML nebulizer solution Take 2 mLs (20 mcg total) by nebulization at bedtime. Brush teeth after treatment. 360 mL 1   guaiFENesin  (MUCINEX ) 600 MG 12 hr tablet Take 1 tablet (600 mg total) by mouth 2 (two) times daily. 30 tablet 0   levothyroxine  (SYNTHROID ) 100 MCG tablet TAKE 1 TABLET BY MOUTH ONCE DAILY BEFORE BREAKFAST 90 tablet 3   MAGNESIUM  PO Take 1 tablet by mouth every evening.     methenamine  (HIPREX ) 1 g tablet Take 1 tablet (1 g total) by mouth 2 (two) times daily with a meal. 180 tablet 0   mirabegron  ER (MYRBETRIQ ) 50 MG TB24 tablet Take 50 mg by mouth in the morning.     mirtazapine  (REMERON ) 15 MG tablet TAKE ONE TABLET BY MOUTH AT BEDTIME 90 tablet 1   NON FORMULARY Take 1 tablet by mouth See admin instructions. [employer] Mature Multivitamin with Calcium - Take 1 tablet by mouth with breakfast in the morning     NON FORMULARY Take 1 capsule by mouth See admin instructions. [employer] probiotic advanced   digestive probiotic- Take 1 capsule by mouth in the morning     OXYGEN  Inhale 2-3.5 L/min into the lungs continuous. (Patient taking differently: Inhale 2-3.5 L/min into the lungs Nightly.)     PRESCRIPTION MEDICATION Apply 1 Device topically See admin instructions. Afflovest- For 30 minutes in the morning and evening- to begin @ 60% and work up to 80%     Respiratory Therapy Supplies (FLUTTER) DEVI Use as directed 1 each 0   Respiratory Therapy Supplies (NEBULIZER MASK ADULT) MISC 1 Device by Does not apply route as directed. 1 each 2   revefenacin  (YUPELRI ) 175 MCG/3ML nebulizer solution Take 3 mLs (175 mcg total) by nebulization every morning. Brush teeth after treatment. 270 mL 1   sodium chloride  HYPERTONIC 3 % nebulizer solution Take by nebulization 2 (two) times daily as needed for cough. Dx J44.9 750 mL 1   tamsulosin  (FLOMAX ) 0.4 MG CAPS capsule Take 0.4 mg by mouth in the morning.     temazepam  (RESTORIL ) 15 MG capsule Take 1 capsule (15 mg total) by mouth every evening. 90 capsule 0   torsemide  (DEMADEX ) 10 MG tablet Take 1 tablet (10 mg total) by mouth daily. 90 tablet 1   No facility-administered medications prior to visit.      Review of Systems  Review of Systems  Constitutional:  Positive for fatigue.  HENT:  Positive for postnasal drip and voice change.   Respiratory:  Positive for cough.   Cardiovascular: Negative.    Physical Exam  There were no vitals taken for this visit. Physical Exam Constitutional:      Appearance: Normal appearance. She is well-developed.  HENT:     Head: Normocephalic and atraumatic.     Mouth/Throat:     Mouth: Mucous membranes are moist.     Pharynx: Oropharynx is clear.  Eyes:     Pupils: Pupils are equal, round, and reactive to light.  Cardiovascular:     Rate and Rhythm: Normal rate and regular rhythm.     Heart sounds: Normal heart sounds. No murmur heard. Pulmonary:     Effort: Pulmonary effort is normal. No respiratory  distress.     Breath sounds: Normal breath sounds. No wheezing or rhonchi.  Musculoskeletal:  General: Normal range of motion.     Cervical back: Normal range of motion and neck supple.  Skin:    General: Skin is warm and dry.     Findings: No erythema or rash.  Neurological:     General: No focal deficit present.     Mental Status: She is alert and oriented to person, place, and time. Mental status is at baseline.  Psychiatric:        Mood and Affect: Mood normal.        Behavior: Behavior normal.        Thought Content: Thought content normal.        Judgment: Judgment normal.      Lab Results:  CBC    Component Value Date/Time   WBC 7.2 10/08/2023 1423   RBC 4.42 10/08/2023 1423   HGB 13.3 10/08/2023 1423   HCT 40.0 10/08/2023 1423   PLT 186.0 10/08/2023 1423   MCV 90.4 10/08/2023 1423   MCH 28.3 11/26/2022 1427   MCHC 33.3 10/08/2023 1423   RDW 14.3 10/08/2023 1423   LYMPHSABS 2.4 10/08/2023 1423   MONOABS 0.7 10/08/2023 1423   EOSABS 0.2 10/08/2023 1423   BASOSABS 0.1 10/08/2023 1423    BMET    Component Value Date/Time   NA 133 (L) 10/08/2023 1423   K 4.1 10/08/2023 1423   CL 96 10/08/2023 1423   CO2 30 10/08/2023 1423   GLUCOSE 84 10/08/2023 1423   BUN 31 (H) 10/08/2023 1423   CREATININE 0.94 10/08/2023 1423   CREATININE 0.52 (L) 11/26/2022 1427   CALCIUM  9.5 10/08/2023 1423   GFRNONAA >60 10/23/2022 0547   GFRAA >60 06/08/2017 0704    BNP    Component Value Date/Time   BNP 106.3 (H) 08/31/2022 1703    ProBNP    Component Value Date/Time   PROBNP 112.0 (H) 01/14/2022 0824    Imaging: No results found.   Assessment & Plan:   No problem-specific Assessment & Plan notes found for this encounter.   1. OSA on CPAP (Primary)  2. Chronic obstructive pulmonary disease, unspecified COPD type (HCC)  3. Chronic respiratory failure with hypoxia (HCC)   Assessment and Plan Assessment & Plan Chronic respiratory failure with  hypoxia Chronic respiratory failure with hypoxia, previously on oxygen  therapy at night, which was self discontinued three weeks ago based on subjective improvement. No recent hospitalizations. Continues to exercise and drive. No issues with aspiration or recent bronchitis. Overnight oximetry test planned to assess current oxygen  levels on room air.  - Ordered overnight oximetry test on room air to assess oxygen  levels - Continue current management without oxygen  therapy unless oximetry indicates otherwise  Bilateral vocal cord paralysis Symptoms of hoarseness, throat clearing, and difficulty swallowing. Cough symptom scale score of 29, improved from previous score of 36. Symptoms have improved over the past year. No current treatment for cough as previous medications were ineffective. Cough is managed with sips of water  and hard candies.  - Continue current management without specific treatment for cough  COPD - Stable; Continue Yupelri  nebulizer daily, budesonide  and formoterol  nebulizer twice daily   Insomnia - Stable; Continue Remeron  15mg  and Restoril  15mg  at bedtime   Almarie LELON Ferrari, NP 03/28/2024     [1]  Allergies Allergen Reactions   Contrast Media [Iodinated Contrast Media] Rash   Iohexol  Hives   Methylprednisolone  Other (See Comments)    Decreases pts heart rate    Morphine  And Codeine  Other (See Comments)  Hallucinations    Tramadol  Other (See Comments)    Hallucinations    Tyloxapol Other (See Comments)   Hydrocodone  Itching   Oxycodone  Rash   Oxycodone -Acetaminophen  Rash and Other (See Comments)    Hallucinations, also   Phenytoin Sodium Extended Rash   Tizanidine Hcl Rash   Zanaflex [Tizanidine Hydrochloride] Rash  [2]  Social History Tobacco Use  Smoking Status Never  Smokeless Tobacco Never   "

## 2024-03-29 ENCOUNTER — Other Ambulatory Visit: Payer: Self-pay | Admitting: Internal Medicine

## 2024-03-31 ENCOUNTER — Ambulatory Visit: Attending: Internal Medicine | Admitting: Physical Therapy

## 2024-03-31 ENCOUNTER — Encounter: Payer: Self-pay | Admitting: Physical Therapy

## 2024-03-31 DIAGNOSIS — S32592D Other specified fracture of left pubis, subsequent encounter for fracture with routine healing: Secondary | ICD-10-CM | POA: Insufficient documentation

## 2024-03-31 DIAGNOSIS — R262 Difficulty in walking, not elsewhere classified: Secondary | ICD-10-CM | POA: Insufficient documentation

## 2024-03-31 DIAGNOSIS — M6281 Muscle weakness (generalized): Secondary | ICD-10-CM | POA: Insufficient documentation

## 2024-03-31 DIAGNOSIS — R2681 Unsteadiness on feet: Secondary | ICD-10-CM | POA: Insufficient documentation

## 2024-03-31 DIAGNOSIS — S32591D Other specified fracture of right pubis, subsequent encounter for fracture with routine healing: Secondary | ICD-10-CM | POA: Insufficient documentation

## 2024-03-31 NOTE — Therapy (Signed)
 " OUTPATIENT PHYSICAL THERAPY NEURO TREATMENT - RECERTIFICATION   Patient Name: Kayla [name] MRN: [id number] DOB:1950/05/31, 74 y.o., female Today's Date: 03/31/2024   PCP: Debby FREDRIK Molt MD REFERRING PROVIDER: Murl Cough, MD   END OF SESSION:  PT End of Session - 03/31/24 0845     Visit Number 12    Number of Visits 25   13 + 12   Date for Recertification  05/27/24   pushed out due to potential scheduling changes   Authorization Type mcr    Progress Note Due on Visit 10    PT Start Time 0845    PT Stop Time 0930    PT Time Calculation (min) 45 min    Equipment Utilized During Treatment Other (comment)   aquatic devices as needed for safety and challenge   Activity Tolerance Patient tolerated treatment well    Behavior During Therapy [facility] for tasks assessed/performed          Past Medical History:  Diagnosis Date   Arthritis    oa   Asthma    Bilateral vocal cord paralysis    Bronchiectasis (HCC)    Cervical cancer (HCC) 2005   Depression    GERD (gastroesophageal reflux disease)    Hiatal hernia    Hypothyroidism    Immunodeficiency    Gets IVIG infusions   Insomnia    Multiple sclerosis 1996   Osteoporosis    Pneumonia 2024, 2014, 1992   Urine retention    Past Surgical History:  Procedure Laterality Date   ABDOMINAL HYSTERECTOMY  03/25/2003   for cervical cancer, complete   BRONCHIAL BIOPSY  10/26/2020   Procedure: BRONCHIAL BIOPSIES;  Surgeon: Brenna Adine CROME, DO;  Location: [facility];  Service: Pulmonary;;   BRONCHIAL BRUSHINGS  10/26/2020   Procedure: BRONCHIAL BRUSHINGS;  Surgeon: Brenna Adine CROME, DO;  Location: [facility];  Service: Pulmonary;;   BRONCHIAL WASHINGS  10/26/2020   Procedure: BRONCHIAL WASHINGS;  Surgeon: Brenna Adine CROME, DO;  Location: [facility];  Service: Pulmonary;;   BRONCHIAL WASHINGS  08/19/2022   Procedure: BRONCHIAL WASHINGS;  Surgeon: Brenna Adine CROME, DO;  Location: [facility];  Service: Cardiopulmonary;;    CATARACT EXTRACTION, BILATERAL Bilateral    lens for cataracts   CONVERSION TO TOTAL HIP Right 09/18/2015   Procedure: CONVERSION OF RIGHT HEMI ARTHROPLASTY TO TOTAL HIP ARTHROPLASTY ACETABULAR REVISON ;  Surgeon: Cough Car, MD;  Location: [facility];  Service: Orthopedics;  Laterality: Right;   HIP ARTHROPLASTY Right 03/01/2013   Procedure: ARTHROPLASTY BIPOLAR HIP;  Surgeon: Cough JONETTA Car, MD;  Location: [facility];  Service: Orthopedics;  Laterality: Right;   HIP CLOSED REDUCTION Right 03/12/2013   Procedure: CLOSED REDUCTION HIP;  Surgeon: Dempsey LULLA Moan, MD;  Location: [facility];  Service: Orthopedics;  Laterality: Right;   HIP CLOSED REDUCTION Right 03/19/2013   Procedure: CLOSED REDUCTION HIP;  Surgeon: Reyes JAYSON Billing, MD;  Location: [facility];  Service: Orthopedics;  Laterality: Right;   HIP CLOSED REDUCTION Right 11/10/2015   Procedure: CLOSED MANIPULATION HIP;  Surgeon: Redell Shoals, MD;  Location: [facility];  Service: Orthopedics;  Laterality: Right;   HIP CLOSED REDUCTION Right 06/05/2017   Procedure: CLOSED REDUCTION HIP;  Surgeon: Heide Ingle, MD;  Location: [facility];  Service: Orthopedics;  Laterality: Right;   SMALL INTESTINE SURGERY     TEE WITHOUT CARDIOVERSION N/A 09/22/2022   Procedure: TRANSESOPHAGEAL ECHOCARDIOGRAM;  Surgeon: Okey Vina LULLA, MD;  Location: [facility];  Service: Cardiovascular;  Laterality: N/A;   TOTAL HIP REVISION Right 06/06/2017   Procedure: TOTAL HIP REVISION;  Surgeon: Heide Ingle, MD;  Location: [facility];  Service: Orthopedics;  Laterality: Right;   VESICOVAGINAL FISTULA CLOSURE W/ TAH  03/25/2003   VIDEO BRONCHOSCOPY N/A 10/26/2020   Procedure: VIDEO BRONCHOSCOPY WITH FLUORO;  Surgeon: Brenna Adine CROME, DO;  Location: [facility];  Service: Pulmonary;  Laterality: N/A;   VIDEO BRONCHOSCOPY Left 08/19/2022   Procedure: VIDEO BRONCHOSCOPY WITH FLUORO; WITH BAL;  Surgeon: Brenna Adine CROME, DO;  Location: [facility];  Service: Cardiopulmonary;   Laterality: Left;   Patient Active Problem List   Diagnosis Date Noted   Neuropathy 11/14/2022   Mild protein malnutrition 10/18/2022   Chronic respiratory failure with hypoxia (HCC) 09/29/2022   Hypogammaglobulinemia 09/22/2022   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) 07/08/2022   GERD (gastroesophageal reflux disease) 06/20/2022   Pulmonary mycobacterial infection (HCC) 09/24/2021   Bilateral vocal cord paralysis 09/24/2021   UTI (urinary tract infection) 05/24/2021   COPD (chronic obstructive pulmonary disease) (HCC)    Iron deficiency anemia secondary to inadequate dietary iron intake 12/20/2020   Purple toe syndrome of both feet (HCC) 07/26/2020   Cough variant asthma vs vcd  related to MS  11/10/2016   Positional sleep apnea 09/10/2016   Periodic limb movements of sleep 09/10/2016   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/21/2013   Constipation 02/26/2013   Hyponatremia 05/20/2012   Post-menopausal osteoporosis 09/17/2007   Hypothyroidism 04/01/2007   Multiple sclerosis 04/01/2007     ONSET DATE: chronic  REFERRING DIAG:  G35 (ICD-10-CM) - Multiple sclerosis  G82.50 (ICD-10-CM) - Quadriplegia, unspecified    THERAPY DIAG:  Muscle weakness (generalized) - Plan: PT plan of care cert/re-cert  Unsteadiness on feet - Plan: PT plan of care cert/re-cert  Difficulty in walking, not elsewhere classified - Plan: PT plan of care cert/re-cert  Rationale for Evaluation and Treatment: Rehabilitation  SUBJECTIVE:                                                                                                                                                                                             SUBJECTIVE STATEMENT: Pt presents for aquatic therapy following recent missed land appt due to clinic emergency.  She is agreeable to same prior discussed plan for re-cert and will see ortho MD soon about potential pain management as her pain has been worse in recent days.   She may want to try TPDN in future.  She presents ambulatory w/ 2WW.   Pt accompanied by: self (husband dropped her off)  PERTINENT HISTORY: MS   PAIN:  Are  you having pain? Yes: NPRS scale: 7/10 Pain location: bilateral hips esp right anterior groin, low back, and feet Pain description: sore, achy Aggravating factors: walking - feels like it might give out Relieving factors: rest  PRECAUTIONS: Fall  RED FLAGS: None   WEIGHT BEARING RESTRICTIONS: No  FALLS: Has patient fallen in last 6 months? No  LIVING ENVIRONMENT: Lives with: lives with their spouse Lives in: House/apartment Stairs: 4 about home Has following equipment at home: Environmental Consultant - 4 wheeled, Shower bench, Grab bars, and modified vehicle with hand controls, scooter  PLOF: Independent with community mobility with device  PATIENT GOALS: improve balance; walk better, be able to stand and cook  OBJECTIVE:  Note: Objective measures were completed at Evaluation unless otherwise noted.  COGNITION: Overall cognitive status: Within functional limits for tasks assessed   SENSATION: impaired  COORDINATION: LE impaired  MUSCLE TONE: LE impaired  MUSCLE LENGTH: hyper knees into ext  DTRs: TBT  POSTURE: anterior pelvic tilt, right pelvic obliquity, weight shift left, and bilateral hyperextension of knees  LOWER EXTREMITY ROM:     Wfl  LOWER EXTREMITY MMT:    MMT Right Eval Left Eval  Hip flexion 3- 3-  Hip extension    Hip abduction    Hip adduction    Hip internal rotation    Hip external rotation    Knee flexion 3 3  Knee extension 3 3  Ankle dorsiflexion 2 3  Ankle plantarflexion    Ankle inversion    Ankle eversion    (Blank rows = not tested)  BED MOBILITY:  indep  TRANSFERS: Sit to stand: Modified independence  Assistive device utilized: Environmental Consultant - 4 wheeled    heavy ue support   STAIRS: 4 steps occasionally used with hand rails indep GAIT: Findings: Gait Characteristics: Right hip  hike, genu recurvatum- Right, genu recurvatum- Left, trendelenburg, and trunk flexed, Distance walked: 500 ft, Assistive device utilized:Walker - 4 wheeled, Level of assistance: Modified independence  FUNCTIONAL TESTS:  Timed up and go (TUG): 46.46 2 MWT128 ft using rollator 4 stage balance: FT close supervision; semi tandem, tandem and SLS unable to complete  PATIENT SURVEYS:  tba                                                                                                                              TREATMENT  Aquatic therapy at [facility] - pool temperature 90 degrees   Patient seen for aquatic therapy today.  Treatment took place in water  3.6-4.8 feet deep depending upon activity.  Patient entered and exited the pool via stairs using step to pattern and R rail at mod I level.   Exercises: Water  walking warmup unsupported 4x18 ft forward > backward > laterally -Attempted yellow moderate DB resistance for squat shoulder flexion and abduction 2x20 > regressed to low resistance multicolor DB for second set -Modified warrior 2 at pool wall x5 each LE in rear holding each x10 seconds; variable  adjustments made throughout to improve IND and dec need for pool wall support -Modified warrior 3 at pool wall using bent rear leg to improve stability x5 each LE x10 sec hold each -STS using single leg stand and bil stance to sit 2x10 alt LE -Quarter turn squats at pool wall x10 each direction; encouraged smaller steps as needed for comfort, sustained squat remains difficult  Patient requires buoyancy of the water  for support for reduced fall risk with gait training and balance exercises with SBA support, verbal cues and return demonstration. Exercises able to be performed safely in water  without the risk of fall compared to those same exercises performed on land; viscosity of water  needed for resistance for strengthening. Current of water  provides perturbations for challenging static and dynamic  balance.   PATIENT EDUCATION: Education details:  Continue pain management techniques at home.  Continue HEP.  Continue Ai Chi, plan for aquatic HEP.  Discussed plan for re-cert, frequency and aquatic only focus based on benefit and scheduling.  Will further schedule land appt for TPDN if desired after ortho appt.  Discussed s/s of DVT in RLE (encouraged monitoring at home and seeking emergency attention should more signs emerge) as pt endorses edema of calf and ankle for past 5 days, warmth last night that seems to have resolved, no localized tenderness in RLE and at baseline function.   Person educated: Patient Education method: Explanation Education comprehension: verbalized understanding  HOME EXERCISE PROGRAM: Access Code: [email or web address] URL: [email or web address] Date: 02/05/2024 Prepared by: Daved Bull  Exercises - Corner Balance Feet Together With Eyes Open  - 1 x daily - 4 x weekly - 1 sets - 2-3 reps - 30 seconds hold - Standing Near Stance in [place] with Eyes Closed  - 1 x daily - 4 x weekly - 1 sets - 2-3 reps - 30 seconds hold - Heel Raises with Counter Support  - 1 x daily - 4 x weekly - 1-2 sets - 5-6 reps - Squat with Chair Touch  - 1 x daily - 4 x weekly - 2 sets - 10 reps - Side Stepping with Resistance at Thighs and Counter Support  - 1 x daily - 4 x weekly - 3 sets - 10 reps  GOALS: Goals reviewed with patient? Yes  SHORT TERM GOALS: Target date: 02/21/24  Pt will tolerate full aquatic sessions consistently without increase in pain and with improving function to demonstrate good toleration and effectiveness of intervention.  Baseline:  tolerating well (12/8) Goal status: MET    LONG TERM GOALS: Target date: 03/18/25  Pt will be indep with final Aquatic HEP for continued management of condition Baseline: In progress (1/8) Goal status: IN PROGRESS  2.  Pt will improve to at least 185ft  to demonstrate improved ability to ambulate into and  out of MD offices/clinics. (MCD = 13ft) Baseline: 128 ft; unable to assess (1/8) Goal status: NOT MET  3.  Pt will improve on Tug test to <or=  40s to demonstrate improvement in lower extremity function, mobility and decreased fall risk. Baseline: 46.46; unable to assess (1/8) Goal status: NOT MET  4.  Pt will report improved walking ability and balance with daily function. Baseline: pain impacted tolerance and function (1/8) Goal status: NOT MET  5.  Pt will demonstrate mindfulness with posture for improved alignment Baseline: Pt is mindful of posture but has difficulty due to pain currently (1/8) Goal status: PARTIALLY MET  GOALS (at 1/8 re-cert): Goals reviewed with patient?  Yes  SHORT TERM GOALS: Target date: 04/22/2024  Pt will demonstrate improved ind w/ ai chi postures and initial aquatic HEP.  Baseline:  Ai chi provided, HEP to be provided (1/8) Goal status: INITIAL  LONG TERM GOALS: Target date: 05/13/2024  Pt will be indep with final Aquatic HEP for continued management of condition Baseline: In progress (1/8) Goal status: IN PROGRESS  2.  Pt will trial TPDN for pain management to improve posture and functional capacity. Baseline: will reschedule missed appt (1/8) Goal status: INITIAL  ASSESSMENT:  CLINICAL IMPRESSION: Recertification performed in pool environment today so some long term goals not assessed due to safety concerns and current high level of pain.  She may benefit from TPDN in future so appt added to allow time to trial this modality.  Further addressing high level balance practice in aquatic setting today with intentions of better tailoring aquatic HEP.  Will continue per POC.  OBJECTIVE IMPAIRMENTS: Abnormal gait, decreased activity tolerance, decreased balance, decreased coordination, decreased endurance, decreased mobility, difficulty walking, decreased strength, impaired tone, and postural dysfunction.   ACTIVITY LIMITATIONS: carrying, lifting,  bending, sitting, standing, squatting, stairs, transfers, and locomotion level  PARTICIPATION LIMITATIONS: meal prep, cleaning, laundry, shopping, community activity, yard work, and cooking  PERSONAL FACTORS: Time since onset of injury/illness/exacerbation are also affecting patient's functional outcome.   REHAB POTENTIAL: Fair -good for stated goals  CLINICAL DECISION MAKING: Stable/uncomplicated  EVALUATION COMPLEXITY: Low  PLAN:  PT FREQUENCY: 1-2x/week  PT DURATION: 6 weeks  PLANNED INTERVENTIONS: 97164- PT Re-evaluation, 97750- Physical Performance Testing, 97110-Therapeutic exercises, 97530- Therapeutic activity, [id number]- Neuromuscular re-education, 97535- Self Care, [id number]- Manual therapy, [phone]- Gait training, [phone]- Aquatic Therapy, [phone]- Ionotophoresis 4mg /ml Dexamethasone , 20560 (1-2 muscles), 20561 (3+ muscles)- Dry Needling, Patient/Family education, Balance training, Stair training, Taping, Joint mobilization, DME instructions, Cryotherapy, and Moist heat  PLAN FOR NEXT SESSION: aquatics: strengthening; balance retraining; TPDN 1/16 - Taylor blocked!  Aquatic HEP; standing tolerance and BLE strength, leg press, bilateral stance training/static balance; SLS - modified tree pose  Daved Bull, PT, DPT          "

## 2024-04-07 ENCOUNTER — Encounter: Payer: Self-pay | Admitting: Physical Therapy

## 2024-04-07 ENCOUNTER — Ambulatory Visit: Admitting: Physical Therapy

## 2024-04-07 DIAGNOSIS — R2681 Unsteadiness on feet: Secondary | ICD-10-CM

## 2024-04-07 DIAGNOSIS — R262 Difficulty in walking, not elsewhere classified: Secondary | ICD-10-CM

## 2024-04-07 DIAGNOSIS — M6281 Muscle weakness (generalized): Secondary | ICD-10-CM

## 2024-04-07 NOTE — Therapy (Signed)
 " OUTPATIENT PHYSICAL THERAPY NEURO TREATMENT   Patient Name: Kayla [name] MRN: [id number] DOB:05-10-1950, 74 y.o., female Today's Date: 04/07/2024   PCP: Debby FREDRIK Molt MD REFERRING PROVIDER: Murl Cough, MD   END OF SESSION:  PT End of Session - 04/07/24 1152     Visit Number 13    Number of Visits 25   13 + 12   Date for Recertification  05/27/24   pushed out due to potential scheduling changes   Authorization Type mcr    Progress Note Due on Visit 10    PT Start Time 1150   pt arrived late to facility   PT Stop Time 1231    PT Time Calculation (min) 41 min    Equipment Utilized During Treatment Other (comment)   aquatic devices as needed for safety and challenge   Activity Tolerance Patient tolerated treatment well    Behavior During Therapy [facility] for tasks assessed/performed          Past Medical History:  Diagnosis Date   Arthritis    oa   Asthma    Bilateral vocal cord paralysis    Bronchiectasis (HCC)    Cervical cancer (HCC) 2005   Depression    GERD (gastroesophageal reflux disease)    Hiatal hernia    Hypothyroidism    Immunodeficiency    Gets IVIG infusions   Insomnia    Multiple sclerosis 1996   Osteoporosis    Pneumonia 2024, 2014, 1992   Urine retention    Past Surgical History:  Procedure Laterality Date   ABDOMINAL HYSTERECTOMY  03/25/2003   for cervical cancer, complete   BRONCHIAL BIOPSY  10/26/2020   Procedure: BRONCHIAL BIOPSIES;  Surgeon: Brenna Adine CROME, DO;  Location: [facility];  Service: Pulmonary;;   BRONCHIAL BRUSHINGS  10/26/2020   Procedure: BRONCHIAL BRUSHINGS;  Surgeon: Brenna Adine CROME, DO;  Location: [facility];  Service: Pulmonary;;   BRONCHIAL WASHINGS  10/26/2020   Procedure: BRONCHIAL WASHINGS;  Surgeon: Brenna Adine CROME, DO;  Location: [facility];  Service: Pulmonary;;   BRONCHIAL WASHINGS  08/19/2022   Procedure: BRONCHIAL WASHINGS;  Surgeon: Brenna Adine CROME, DO;  Location: [facility];  Service:  Cardiopulmonary;;   CATARACT EXTRACTION, BILATERAL Bilateral    lens for cataracts   CONVERSION TO TOTAL HIP Right 09/18/2015   Procedure: CONVERSION OF RIGHT HEMI ARTHROPLASTY TO TOTAL HIP ARTHROPLASTY ACETABULAR REVISON ;  Surgeon: Cough Car, MD;  Location: [facility];  Service: Orthopedics;  Laterality: Right;   HIP ARTHROPLASTY Right 03/01/2013   Procedure: ARTHROPLASTY BIPOLAR HIP;  Surgeon: Cough JONETTA Car, MD;  Location: [facility];  Service: Orthopedics;  Laterality: Right;   HIP CLOSED REDUCTION Right 03/12/2013   Procedure: CLOSED REDUCTION HIP;  Surgeon: Dempsey LULLA Moan, MD;  Location: [facility];  Service: Orthopedics;  Laterality: Right;   HIP CLOSED REDUCTION Right 03/19/2013   Procedure: CLOSED REDUCTION HIP;  Surgeon: Reyes JAYSON Billing, MD;  Location: [facility];  Service: Orthopedics;  Laterality: Right;   HIP CLOSED REDUCTION Right 11/10/2015   Procedure: CLOSED MANIPULATION HIP;  Surgeon: Redell Shoals, MD;  Location: [facility];  Service: Orthopedics;  Laterality: Right;   HIP CLOSED REDUCTION Right 06/05/2017   Procedure: CLOSED REDUCTION HIP;  Surgeon: Heide Ingle, MD;  Location: [facility];  Service: Orthopedics;  Laterality: Right;   SMALL INTESTINE SURGERY     TEE WITHOUT CARDIOVERSION N/A 09/22/2022   Procedure: TRANSESOPHAGEAL ECHOCARDIOGRAM;  Surgeon: Okey Vina LULLA, MD;  Location: [facility] INVASIVE CV [facility];  Service: Cardiovascular;  Laterality: N/A;   TOTAL HIP REVISION Right 06/06/2017   Procedure: TOTAL HIP REVISION;  Surgeon: Heide Ingle, MD;  Location: [facility];  Service: Orthopedics;  Laterality: Right;   VESICOVAGINAL FISTULA CLOSURE W/ TAH  03/25/2003   VIDEO BRONCHOSCOPY N/A 10/26/2020   Procedure: VIDEO BRONCHOSCOPY WITH FLUORO;  Surgeon: Brenna Adine CROME, DO;  Location: [facility];  Service: Pulmonary;  Laterality: N/A;   VIDEO BRONCHOSCOPY Left 08/19/2022   Procedure: VIDEO BRONCHOSCOPY WITH FLUORO; WITH BAL;  Surgeon: Brenna Adine CROME, DO;  Location: [facility];  Service:  Cardiopulmonary;  Laterality: Left;   Patient Active Problem List   Diagnosis Date Noted   Neuropathy 11/14/2022   Mild protein malnutrition 10/18/2022   Chronic respiratory failure with hypoxia (HCC) 09/29/2022   Hypogammaglobulinemia 09/22/2022   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) 07/08/2022   GERD (gastroesophageal reflux disease) 06/20/2022   Pulmonary mycobacterial infection (HCC) 09/24/2021   Bilateral vocal cord paralysis 09/24/2021   UTI (urinary tract infection) 05/24/2021   COPD (chronic obstructive pulmonary disease) (HCC)    Iron deficiency anemia secondary to inadequate dietary iron intake 12/20/2020   Purple toe syndrome of both feet (HCC) 07/26/2020   Cough variant asthma vs vcd  related to MS  11/10/2016   Positional sleep apnea 09/10/2016   Periodic limb movements of sleep 09/10/2016   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/21/2013   Constipation 02/26/2013   Hyponatremia 05/20/2012   Post-menopausal osteoporosis 09/17/2007   Hypothyroidism 04/01/2007   Multiple sclerosis 04/01/2007     ONSET DATE: chronic  REFERRING DIAG:  G35 (ICD-10-CM) - Multiple sclerosis  G82.50 (ICD-10-CM) - Quadriplegia, unspecified    THERAPY DIAG:  Muscle weakness (generalized)  Unsteadiness on feet  Difficulty in walking, not elsewhere classified  Rationale for Evaluation and Treatment: Rehabilitation  SUBJECTIVE:                                                                                                                                                                                             SUBJECTIVE STATEMENT: Pt presents for aquatic therapy in transport chair due to cold weather impact on movement.  Husband reports that ortho MD did hip MRI and saw nothing correctable meaning the pain is the pain.  She was prescribed aleve  and has been taking based on that - unsure if helping.  No falls.  Using 2WW for transfers today.  Pt accompanied by: self  (husband dropped her off)  PERTINENT HISTORY: MS   PAIN:  Are you having pain? Yes: NPRS scale: 4/10 Pain location: bilateral hips esp right anterior groin, low back, and feet  Pain description: sore, achy Aggravating factors: walking - feels like it might give out Relieving factors: rest  PRECAUTIONS: Fall  RED FLAGS: None   WEIGHT BEARING RESTRICTIONS: No  FALLS: Has patient fallen in last 6 months? No  LIVING ENVIRONMENT: Lives with: lives with their spouse Lives in: House/apartment Stairs: 4 about home Has following equipment at home: Environmental Consultant - 4 wheeled, Shower bench, Grab bars, and modified vehicle with hand controls, scooter  PLOF: Independent with community mobility with device  PATIENT GOALS: improve balance; walk better, be able to stand and cook  OBJECTIVE:  Note: Objective measures were completed at Evaluation unless otherwise noted.  COGNITION: Overall cognitive status: Within functional limits for tasks assessed   SENSATION: impaired  COORDINATION: LE impaired  MUSCLE TONE: LE impaired  MUSCLE LENGTH: hyper knees into ext  DTRs: TBT  POSTURE: anterior pelvic tilt, right pelvic obliquity, weight shift left, and bilateral hyperextension of knees  LOWER EXTREMITY ROM:     Wfl  LOWER EXTREMITY MMT:    MMT Right Eval Left Eval  Hip flexion 3- 3-  Hip extension    Hip abduction    Hip adduction    Hip internal rotation    Hip external rotation    Knee flexion 3 3  Knee extension 3 3  Ankle dorsiflexion 2 3  Ankle plantarflexion    Ankle inversion    Ankle eversion    (Blank rows = not tested)  BED MOBILITY:  indep  TRANSFERS: Sit to stand: Modified independence  Assistive device utilized: Environmental Consultant - 4 wheeled    heavy ue support   STAIRS: 4 steps occasionally used with hand rails indep GAIT: Findings: Gait Characteristics: Right hip hike, genu recurvatum- Right, genu recurvatum- Left, trendelenburg, and trunk flexed, Distance  walked: 500 ft, Assistive device utilized:Walker - 4 wheeled, Level of assistance: Modified independence  FUNCTIONAL TESTS:  Timed up and go (TUG): 46.46 2 MWT128 ft using rollator 4 stage balance: FT close supervision; semi tandem, tandem and SLS unable to complete  PATIENT SURVEYS:  tba                                                                                                                              TREATMENT  Aquatic therapy at [facility] - pool temperature 92 degrees   Patient seen for aquatic therapy today.  Treatment took place in water  3.6-4.8 feet deep depending upon activity.  Patient entered and exited the pool via stairs using step to pattern and R rail at mod I level.   Exercises: Water  walking warmup unsupported 4x18 ft forward > backward > laterally -Modified tree pose using bench in shallow water  - increased difficulty w/ LE posture w/ RLE (stable LLE stance) -STS 2x10 each LE elevated on 5 step using RUE for overpressure through R knee -4x18 ft marching w/ contralateral arm swing using moderate resistance paddles -4 x18 ft marching w/ contralateral twist using moderate resistance paddles; increased  bilateral foot clawing and pt reporting foot sensitivity w/ task -Yellow noodle moderate posterior support for seated floating for flutter kicks x2 minutes > bicycles x2 minutes to decrease pressure on feet and relax tone contributing to toe clawing > floating knee tuck into hip abduction/adduction x1 minute  Patient requires buoyancy of the water  for support for reduced fall risk with gait training and balance exercises with SBA support, verbal cues and return demonstration. Exercises able to be performed safely in water  without the risk of fall compared to those same exercises performed on land; viscosity of water  needed for resistance for strengthening. Current of water  provides perturbations for challenging static and dynamic balance.   PATIENT EDUCATION: Education  details:  Continue pain management techniques at home.  Continue HEP.  Continue Ai Chi, plan for aquatic HEP.  TPDN tomorrow. Person educated: Patient Education method: Explanation Education comprehension: verbalized understanding  HOME EXERCISE PROGRAM: Access Code: [email or web address] URL: [email or web address] Date: 02/05/2024 Prepared by: Daved Bull  Exercises - Corner Balance Feet Together With Eyes Open  - 1 x daily - 4 x weekly - 1 sets - 2-3 reps - 30 seconds hold - Standing Near Stance in [place] with Eyes Closed  - 1 x daily - 4 x weekly - 1 sets - 2-3 reps - 30 seconds hold - Heel Raises with Counter Support  - 1 x daily - 4 x weekly - 1-2 sets - 5-6 reps - Squat with Chair Touch  - 1 x daily - 4 x weekly - 2 sets - 10 reps - Side Stepping with Resistance at Thighs and Counter Support  - 1 x daily - 4 x weekly - 3 sets - 10 reps  GOALS: Goals reviewed with patient? Yes  SHORT TERM GOALS: Target date: 02/21/24  Pt will tolerate full aquatic sessions consistently without increase in pain and with improving function to demonstrate good toleration and effectiveness of intervention.  Baseline:  tolerating well (12/8) Goal status: MET    LONG TERM GOALS: Target date: 03/18/25  Pt will be indep with final Aquatic HEP for continued management of condition Baseline: In progress (1/8) Goal status: IN PROGRESS  2.  Pt will improve to at least 160ft  to demonstrate improved ability to ambulate into and out of MD offices/clinics. (MCD = 43ft) Baseline: 128 ft; unable to assess (1/8) Goal status: NOT MET  3.  Pt will improve on Tug test to <or=  40s to demonstrate improvement in lower extremity function, mobility and decreased fall risk. Baseline: 46.46; unable to assess (1/8) Goal status: NOT MET  4.  Pt will report improved walking ability and balance with daily function. Baseline: pain impacted tolerance and function (1/8) Goal status: NOT MET  5.  Pt will  demonstrate mindfulness with posture for improved alignment Baseline: Pt is mindful of posture but has difficulty due to pain currently (1/8) Goal status: PARTIALLY MET  GOALS (at 1/8 re-cert): Goals reviewed with patient? Yes  SHORT TERM GOALS: Target date: 04/22/2024  Pt will demonstrate improved ind w/ ai chi postures and initial aquatic HEP.  Baseline:  Ai chi provided, HEP to be provided (1/8) Goal status: INITIAL  LONG TERM GOALS: Target date: 05/13/2024  Pt will be indep with final Aquatic HEP for continued management of condition Baseline: In progress (1/8) Goal status: IN PROGRESS  2.  Pt will trial TPDN for pain management to improve posture and functional capacity. Baseline: will reschedule missed appt (1/8) Goal status: INITIAL  ASSESSMENT:  CLINICAL IMPRESSION: Focus of skilled aquatic session today on progressing strength challenges for BLE and core.  Her RLE continues to drift into IR with transfers and ambulation and demonstrates increased need for support in variable postures due to weakness.  She may benefit from TPDN for groin tightness and pain.  Certified therapist will assist in better assessment for this tomorrow.  Will continue per POC.  OBJECTIVE IMPAIRMENTS: Abnormal gait, decreased activity tolerance, decreased balance, decreased coordination, decreased endurance, decreased mobility, difficulty walking, decreased strength, impaired tone, and postural dysfunction.   ACTIVITY LIMITATIONS: carrying, lifting, bending, sitting, standing, squatting, stairs, transfers, and locomotion level  PARTICIPATION LIMITATIONS: meal prep, cleaning, laundry, shopping, community activity, yard work, and cooking  PERSONAL FACTORS: Time since onset of injury/illness/exacerbation are also affecting patient's functional outcome.   REHAB POTENTIAL: Fair -good for stated goals  CLINICAL DECISION MAKING: Stable/uncomplicated  EVALUATION COMPLEXITY: Low  PLAN:  PT FREQUENCY:  1-2x/week  PT DURATION: 6 weeks  PLANNED INTERVENTIONS: 97164- PT Re-evaluation, 97750- Physical Performance Testing, 97110-Therapeutic exercises, 97530- Therapeutic activity, [id number]- Neuromuscular re-education, 97535- Self Care, [id number]- Manual therapy, [phone]- Gait training, [phone]- Aquatic Therapy, [phone]- Ionotophoresis 4mg /ml Dexamethasone , 79439 (1-2 muscles), [id number] (3+ muscles)- Dry Needling, Patient/Family education, Balance training, Stair training, Taping, Joint mobilization, DME instructions, Cryotherapy, and Moist heat  PLAN FOR NEXT SESSION: aquatics: strengthening; balance retraining; TPDN 1/16 - Taylor blocked!  Aquatic HEP; standing tolerance and BLE strength, leg press, bilateral stance training/static balance; SLS - modified tree pose  Daved Bull, PT, DPT          "

## 2024-04-08 ENCOUNTER — Encounter: Payer: Self-pay | Admitting: Physical Therapy

## 2024-04-08 ENCOUNTER — Ambulatory Visit: Admitting: Physical Therapy

## 2024-04-08 DIAGNOSIS — R262 Difficulty in walking, not elsewhere classified: Secondary | ICD-10-CM

## 2024-04-08 DIAGNOSIS — R2681 Unsteadiness on feet: Secondary | ICD-10-CM

## 2024-04-08 DIAGNOSIS — M6281 Muscle weakness (generalized): Secondary | ICD-10-CM

## 2024-04-08 NOTE — Therapy (Signed)
 " OUTPATIENT PHYSICAL THERAPY NEURO TREATMENT   Patient Name: Kayla [name] MRN: [id number] DOB:1950-04-30, 74 y.o., female Today's Date: 04/08/2024   PCP: Debby FREDRIK Molt MD REFERRING PROVIDER: Murl Cough, MD   END OF SESSION:  PT End of Session - 04/08/24 1501     Visit Number 14    Number of Visits 25   13 + 12   Date for Recertification  05/27/24   pushed out due to potential scheduling changes   Authorization Type mcr    Progress Note Due on Visit 10    PT Start Time 1456   pt arrived late   PT Stop Time 1539    PT Time Calculation (min) 43 min    Equipment Utilized During Treatment Gait belt    Activity Tolerance Patient tolerated treatment well    Behavior During Therapy WFL for tasks assessed/performed          Past Medical History:  Diagnosis Date   Arthritis    oa   Asthma    Bilateral vocal cord paralysis    Bronchiectasis (HCC)    Cervical cancer (HCC) 2005   Depression    GERD (gastroesophageal reflux disease)    Hiatal hernia    Hypothyroidism    Immunodeficiency    Gets IVIG infusions   Insomnia    Multiple sclerosis 1996   Osteoporosis    Pneumonia 2024, 2014, 1992   Urine retention    Past Surgical History:  Procedure Laterality Date   ABDOMINAL HYSTERECTOMY  03/25/2003   for cervical cancer, complete   BRONCHIAL BIOPSY  10/26/2020   Procedure: BRONCHIAL BIOPSIES;  Surgeon: Brenna Adine CROME, DO;  Location: [facility];  Service: Pulmonary;;   BRONCHIAL BRUSHINGS  10/26/2020   Procedure: BRONCHIAL BRUSHINGS;  Surgeon: Brenna Adine CROME, DO;  Location: [facility];  Service: Pulmonary;;   BRONCHIAL WASHINGS  10/26/2020   Procedure: BRONCHIAL WASHINGS;  Surgeon: Brenna Adine CROME, DO;  Location: [facility];  Service: Pulmonary;;   BRONCHIAL WASHINGS  08/19/2022   Procedure: BRONCHIAL WASHINGS;  Surgeon: Brenna Adine CROME, DO;  Location: [facility];  Service: Cardiopulmonary;;   CATARACT EXTRACTION, BILATERAL Bilateral    lens for  cataracts   CONVERSION TO TOTAL HIP Right 09/18/2015   Procedure: CONVERSION OF RIGHT HEMI ARTHROPLASTY TO TOTAL HIP ARTHROPLASTY ACETABULAR REVISON ;  Surgeon: Cough Car, MD;  Location: [facility];  Service: Orthopedics;  Laterality: Right;   HIP ARTHROPLASTY Right 03/01/2013   Procedure: ARTHROPLASTY BIPOLAR HIP;  Surgeon: Cough JONETTA Car, MD;  Location: [facility];  Service: Orthopedics;  Laterality: Right;   HIP CLOSED REDUCTION Right 03/12/2013   Procedure: CLOSED REDUCTION HIP;  Surgeon: Dempsey LULLA Moan, MD;  Location: [facility];  Service: Orthopedics;  Laterality: Right;   HIP CLOSED REDUCTION Right 03/19/2013   Procedure: CLOSED REDUCTION HIP;  Surgeon: Reyes JAYSON Billing, MD;  Location: [facility];  Service: Orthopedics;  Laterality: Right;   HIP CLOSED REDUCTION Right 11/10/2015   Procedure: CLOSED MANIPULATION HIP;  Surgeon: Redell Shoals, MD;  Location: [facility];  Service: Orthopedics;  Laterality: Right;   HIP CLOSED REDUCTION Right 06/05/2017   Procedure: CLOSED REDUCTION HIP;  Surgeon: Heide Ingle, MD;  Location: [facility];  Service: Orthopedics;  Laterality: Right;   SMALL INTESTINE SURGERY     TEE WITHOUT CARDIOVERSION N/A 09/22/2022   Procedure: TRANSESOPHAGEAL ECHOCARDIOGRAM;  Surgeon: Okey Vina LULLA, MD;  Location: [facility];  Service: Cardiovascular;  Laterality: N/A;   TOTAL HIP REVISION  Right 06/06/2017   Procedure: TOTAL HIP REVISION;  Surgeon: Heide Ingle, MD;  Location: [facility];  Service: Orthopedics;  Laterality: Right;   VESICOVAGINAL FISTULA CLOSURE W/ TAH  03/25/2003   VIDEO BRONCHOSCOPY N/A 10/26/2020   Procedure: VIDEO BRONCHOSCOPY WITH FLUORO;  Surgeon: Brenna Adine CROME, DO;  Location: [facility];  Service: Pulmonary;  Laterality: N/A;   VIDEO BRONCHOSCOPY Left 08/19/2022   Procedure: VIDEO BRONCHOSCOPY WITH FLUORO; WITH BAL;  Surgeon: Brenna Adine CROME, DO;  Location: [facility];  Service: Cardiopulmonary;  Laterality: Left;   Patient Active Problem List    Diagnosis Date Noted   Neuropathy 11/14/2022   Mild protein malnutrition 10/18/2022   Chronic respiratory failure with hypoxia (HCC) 09/29/2022   Hypogammaglobulinemia 09/22/2022   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) 07/08/2022   GERD (gastroesophageal reflux disease) 06/20/2022   Pulmonary mycobacterial infection (HCC) 09/24/2021   Bilateral vocal cord paralysis 09/24/2021   UTI (urinary tract infection) 05/24/2021   COPD (chronic obstructive pulmonary disease) (HCC)    Iron deficiency anemia secondary to inadequate dietary iron intake 12/20/2020   Purple toe syndrome of both feet (HCC) 07/26/2020   Cough variant asthma vs vcd  related to MS  11/10/2016   Positional sleep apnea 09/10/2016   Periodic limb movements of sleep 09/10/2016   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/21/2013   Constipation 02/26/2013   Hyponatremia 05/20/2012   Post-menopausal osteoporosis 09/17/2007   Hypothyroidism 04/01/2007   Multiple sclerosis 04/01/2007     ONSET DATE: chronic  REFERRING DIAG:  G35 (ICD-10-CM) - Multiple sclerosis  G82.50 (ICD-10-CM) - Quadriplegia, unspecified    THERAPY DIAG:  Muscle weakness (generalized)  Unsteadiness on feet  Difficulty in walking, not elsewhere classified  Rationale for Evaluation and Treatment: Rehabilitation  SUBJECTIVE:                                                                                                                                                                                             SUBJECTIVE STATEMENT: Pt presents late w/ 2WW.  She reports no recent falls.  She is still interested in TPDN today for R hip/groin discomfort.  She reports her Bioness is up too high.  Pt accompanied by: self (husband dropped her off)  PERTINENT HISTORY: MS   PAIN:  Are you having pain? Yes: NPRS scale: 4/10 Pain location: bilateral hips esp right anterior groin, low back, and feet Pain description: sore, achy Aggravating  factors: walking - feels like it might give out Relieving factors: rest  PRECAUTIONS: Fall  RED FLAGS: None   WEIGHT BEARING RESTRICTIONS: No  FALLS: Has patient fallen in last  6 months? No  LIVING ENVIRONMENT: Lives with: lives with their spouse Lives in: House/apartment Stairs: 4 about home Has following equipment at home: Environmental Consultant - 4 wheeled, Shower bench, Grab bars, and modified vehicle with hand controls, scooter  PLOF: Independent with community mobility with device  PATIENT GOALS: improve balance; walk better, be able to stand and cook  OBJECTIVE:  Note: Objective measures were completed at Evaluation unless otherwise noted.  COGNITION: Overall cognitive status: Within functional limits for tasks assessed   SENSATION: impaired  COORDINATION: LE impaired  MUSCLE TONE: LE impaired  MUSCLE LENGTH: hyper knees into ext  DTRs: TBT  POSTURE: anterior pelvic tilt, right pelvic obliquity, weight shift left, and bilateral hyperextension of knees  LOWER EXTREMITY ROM:     Wfl  LOWER EXTREMITY MMT:    MMT Right Eval Left Eval  Hip flexion 3- 3-  Hip extension    Hip abduction    Hip adduction    Hip internal rotation    Hip external rotation    Knee flexion 3 3  Knee extension 3 3  Ankle dorsiflexion 2 3  Ankle plantarflexion    Ankle inversion    Ankle eversion    (Blank rows = not tested)  BED MOBILITY:  indep  TRANSFERS: Sit to stand: Modified independence  Assistive device utilized: Environmental Consultant - 4 wheeled    heavy ue support   STAIRS: 4 steps occasionally used with hand rails indep GAIT: Findings: Gait Characteristics: Right hip hike, genu recurvatum- Right, genu recurvatum- Left, trendelenburg, and trunk flexed, Distance walked: 500 ft, Assistive device utilized:Walker - 4 wheeled, Level of assistance: Modified independence  FUNCTIONAL TESTS:  Timed up and go (TUG): 46.46 2 MWT128 ft using rollator 4 stage balance: FT close supervision; semi  tandem, tandem and SLS unable to complete  PATIENT SURVEYS:  tba                                                                                                                              TREATMENT  Trigger Point Dry Needling  Initial Treatment: Pt instructed on Dry Needling rational, procedures, and possible side effects. Pt instructed to expect mild to moderate muscle soreness later in the day and/or into the next day.  Pt instructed in methods to reduce muscle soreness. Pt instructed to continue prescribed HEP. Because Dry Needling was performed over or adjacent to a lung field, pt was educated on S/S of pneumothorax and to seek immediate medical attention should they occur.  Patient was educated on signs and symptoms of infection and other risk factors and advised to seek medical attention should they occur.  Patient verbalized understanding of these instructions and education.   Patient Verbal Consent Given: Yes Education Handout Provided: Yes Muscles Treated: R hip adductors Electrical Stimulation Performed: No Treatment Response/Outcome: deep ache/pressure; muscle twitch detected    -MET to R pelvis and hip musculature (5-6 reps in passive 90/90 IR, ER, adduction, abduction,  hip flexion, hip extension > repeated isometrics w/ foot on mat table) > PROM and gentle butterfly stretch 3x30-45 seconds > CW/CCW passive hip circles and gentle oscillations for muscle relaxation, tone management, and pain modulation.    Trigger Point Dry Needling  What is Trigger Point Dry Needling (DN)? DN is a physical therapy technique used to treat muscle pain and dysfunction. Specifically, DN helps deactivate muscle trigger points (muscle knots).  A thin filiform needle is used to penetrate the skin and stimulate the underlying trigger point. The goal is for a local twitch response (LTR) to occur and for the trigger point to relax. No medication of any kind is injected during the  procedure.  Benefits of Trigger Point Dry Needling Reduces localized tension and stiffness promoting muscle function. Promotes range of motion and flexibility of the area being treated. Relieves localized and referred muscle related pain.  Promotes localized blood flow. Benefits of DN increase when performed with formal therapy including strengthening and stretching.   What Does Trigger Point Dry Needling Feel Like?  The procedure feels different for each individual patient. Some patients report that they do not actually feel the needle enter the skin and overall the process is not painful. Very mild bleeding may occur. However, many patients feel a deep cramping in the muscle in which the needle was inserted. This is the local twitch response.   How Will I feel after the treatment? Soreness is normal, and the onset of soreness may not occur for a few hours. Typically this soreness does not last longer than two days.  Bruising is uncommon, however; ice can be used to decrease any possible bruising.  In rare cases feeling tired or nauseous after the treatment is normal. In addition, your symptoms may get worse before they get better, this period will typically not last longer than 24 hours.   What Can I do After My Treatment? Increase your hydration by drinking more water  for the next 24 hours.  You may place ice or heat on the areas treated that have become sore, however, do not use heat on inflamed or bruised areas. Heat often brings more relief post needling. You can continue your regular activities, but vigorous activity is not recommended initially after the treatment for 24 hours. DN is best combined with other physical therapy such as strengthening, stretching, and other therapies.   What are the complications? While your therapist has had extensive training in minimizing the risks of trigger point dry needling, it is important to understand the risks of any procedure.  Risks include  bleeding, pain, fatigue, hematoma, infection, vertigo, nausea or nerve involvement. Monitor for any changes to your skin or sensation. Contact your therapist or MD with concerns.  A rare but serious complication is a pneumothorax over or near your middle and upper chest and back If you have dry needling in this area, monitor for the following symptoms: Shortness of breath on exertion and/or Difficulty taking a deep breath and/or Chest Pain and/or A dry cough If any of the above symptoms develop, please go to the nearest emergency room or call 911. Tell them you had dry needling over your thorax and report any symptoms you are having. Please follow-up with your treating therapist after you complete the medical evaluation.   PATIENT EDUCATION: Education details:  TPDN (see above).  Continue pain management techniques at home.  Continue HEP.  Continue Ai Chi, plan for aquatic HEP.  Person educated: Patient Education method: Explanation Education comprehension:  verbalized understanding  HOME EXERCISE PROGRAM: Access Code: [email or web address] URL: [email or web address] Date: 02/05/2024 Prepared by: Daved Bull  Exercises - Corner Balance Feet Together With Eyes Open  - 1 x daily - 4 x weekly - 1 sets - 2-3 reps - 30 seconds hold - Standing Near Stance in [place] with Eyes Closed  - 1 x daily - 4 x weekly - 1 sets - 2-3 reps - 30 seconds hold - Heel Raises with Counter Support  - 1 x daily - 4 x weekly - 1-2 sets - 5-6 reps - Squat with Chair Touch  - 1 x daily - 4 x weekly - 2 sets - 10 reps - Side Stepping with Resistance at Thighs and Counter Support  - 1 x daily - 4 x weekly - 3 sets - 10 reps  GOALS: Goals reviewed with patient? Yes  SHORT TERM GOALS: Target date: 02/21/24  Pt will tolerate full aquatic sessions consistently without increase in pain and with improving function to demonstrate good toleration and effectiveness of intervention.  Baseline:  tolerating well  (12/8) Goal status: MET    LONG TERM GOALS: Target date: 03/18/25  Pt will be indep with final Aquatic HEP for continued management of condition Baseline: In progress (1/8) Goal status: IN PROGRESS  2.  Pt will improve to at least 156ft  to demonstrate improved ability to ambulate into and out of MD offices/clinics. (MCD = 62ft) Baseline: 128 ft; unable to assess (1/8) Goal status: NOT MET  3.  Pt will improve on Tug test to <or=  40s to demonstrate improvement in lower extremity function, mobility and decreased fall risk. Baseline: 46.46; unable to assess (1/8) Goal status: NOT MET  4.  Pt will report improved walking ability and balance with daily function. Baseline: pain impacted tolerance and function (1/8) Goal status: NOT MET  5.  Pt will demonstrate mindfulness with posture for improved alignment Baseline: Pt is mindful of posture but has difficulty due to pain currently (1/8) Goal status: PARTIALLY MET  GOALS (at 1/8 re-cert): Goals reviewed with patient? Yes  SHORT TERM GOALS: Target date: 04/22/2024  Pt will demonstrate improved ind w/ ai chi postures and initial aquatic HEP.  Baseline:  Ai chi provided, HEP to be provided (1/8) Goal status: INITIAL  LONG TERM GOALS: Target date: 05/13/2024  Pt will be indep with final Aquatic HEP for continued management of condition Baseline: In progress (1/8) Goal status: IN PROGRESS  2.  Pt will trial TPDN for pain management to improve posture and functional capacity. Baseline: will reschedule missed appt (1/8) Goal status: INITIAL  ASSESSMENT:  CLINICAL IMPRESSION: TPDN completed at onset of session for R hip muscle relaxation and pain management.  She has positive response to technique during session.  Followed modality with passive ROM and muscle energy to improve RLE static positioning, but hip remains IR during dynamic and most static positioning for transfers due to ongoing functional weakness.  Pt remains  closed to bracing options due to hassle to don and doff from prior experience.  She prefers to continue with aquatic POC and using Bioness to supplement RLE mobility.  Will continue per POC.  OBJECTIVE IMPAIRMENTS: Abnormal gait, decreased activity tolerance, decreased balance, decreased coordination, decreased endurance, decreased mobility, difficulty walking, decreased strength, impaired tone, and postural dysfunction.   ACTIVITY LIMITATIONS: carrying, lifting, bending, sitting, standing, squatting, stairs, transfers, and locomotion level  PARTICIPATION LIMITATIONS: meal prep, cleaning, laundry, shopping, community activity, yard work, and cooking  PERSONAL FACTORS:  Time since onset of injury/illness/exacerbation are also affecting patient's functional outcome.   REHAB POTENTIAL: Fair -good for stated goals  CLINICAL DECISION MAKING: Stable/uncomplicated  EVALUATION COMPLEXITY: Low  PLAN:  PT FREQUENCY: 1-2x/week  PT DURATION: 6 weeks  PLANNED INTERVENTIONS: 97164- PT Re-evaluation, 97750- Physical Performance Testing, 97110-Therapeutic exercises, 97530- Therapeutic activity, [id number]- Neuromuscular re-education, 97535- Self Care, [id number]- Manual therapy, [id number]- Gait training, [phone]- Aquatic Therapy, [phone]- Ionotophoresis 4mg /ml Dexamethasone , 79439 (1-2 muscles), 20561 (3+ muscles)- Dry Needling, Patient/Family education, Balance training, Stair training, Taping, Joint mobilization, DME instructions, Cryotherapy, and Moist heat  PLAN FOR NEXT SESSION: aquatics: strengthening; balance retraining  Aquatic HEP; standing tolerance and BLE strength, leg press, bilateral stance training/static balance  Daved Bull, PT, DPT Taylor [place]alo, PT, DPT, CSRS           "

## 2024-04-11 ENCOUNTER — Other Ambulatory Visit (HOSPITAL_BASED_OUTPATIENT_CLINIC_OR_DEPARTMENT_OTHER): Payer: Self-pay

## 2024-04-11 ENCOUNTER — Ambulatory Visit: Admitting: Internal Medicine

## 2024-04-11 ENCOUNTER — Other Ambulatory Visit (HOSPITAL_COMMUNITY): Payer: Self-pay

## 2024-04-11 ENCOUNTER — Ambulatory Visit: Payer: Self-pay | Admitting: Internal Medicine

## 2024-04-11 ENCOUNTER — Encounter: Payer: Self-pay | Admitting: Internal Medicine

## 2024-04-11 VITALS — BP 114/62 | HR 83 | Temp 97.7°F | Resp 16 | Ht 63.0 in | Wt 123.8 lb

## 2024-04-11 DIAGNOSIS — E871 Hypo-osmolality and hyponatremia: Secondary | ICD-10-CM

## 2024-04-11 DIAGNOSIS — I959 Hypotension, unspecified: Secondary | ICD-10-CM

## 2024-04-11 DIAGNOSIS — J9611 Chronic respiratory failure with hypoxia: Secondary | ICD-10-CM

## 2024-04-11 DIAGNOSIS — E038 Other specified hypothyroidism: Secondary | ICD-10-CM

## 2024-04-11 DIAGNOSIS — R9431 Abnormal electrocardiogram [ECG] [EKG]: Secondary | ICD-10-CM | POA: Diagnosis not present

## 2024-04-11 DIAGNOSIS — D508 Other iron deficiency anemias: Secondary | ICD-10-CM | POA: Diagnosis not present

## 2024-04-11 LAB — BASIC METABOLIC PANEL WITH GFR
BUN: 32 mg/dL — ABNORMAL HIGH (ref 6–23)
CO2: 31 meq/L (ref 19–32)
Calcium: 9.7 mg/dL (ref 8.4–10.5)
Chloride: 96 meq/L (ref 96–112)
Creatinine, Ser: 0.87 mg/dL (ref 0.40–1.20)
GFR: 66.89 mL/min
Glucose, Bld: 89 mg/dL (ref 70–99)
Potassium: 4 meq/L (ref 3.5–5.1)
Sodium: 134 meq/L — ABNORMAL LOW (ref 135–145)

## 2024-04-11 LAB — IBC + FERRITIN
Ferritin: 105.9 ng/mL (ref 10.0–291.0)
Iron: 94 ug/dL (ref 42–145)
Saturation Ratios: 27.4 % (ref 20.0–50.0)
TIBC: 343 ug/dL (ref 250.0–450.0)
Transferrin: 245 mg/dL (ref 212.0–360.0)

## 2024-04-11 LAB — CBC WITH DIFFERENTIAL/PLATELET
Basophils Absolute: 0.1 K/uL (ref 0.0–0.1)
Basophils Relative: 1.1 % (ref 0.0–3.0)
Eosinophils Absolute: 0.1 K/uL (ref 0.0–0.7)
Eosinophils Relative: 1.1 % (ref 0.0–5.0)
HCT: 39 % (ref 36.0–46.0)
Hemoglobin: 13.3 g/dL (ref 12.0–15.0)
Lymphocytes Relative: 46.1 % — ABNORMAL HIGH (ref 12.0–46.0)
Lymphs Abs: 2.5 K/uL (ref 0.7–4.0)
MCHC: 34.2 g/dL (ref 30.0–36.0)
MCV: 91.5 fl (ref 78.0–100.0)
Monocytes Absolute: 0.6 K/uL (ref 0.1–1.0)
Monocytes Relative: 10.5 % (ref 3.0–12.0)
Neutro Abs: 2.3 K/uL (ref 1.4–7.7)
Neutrophils Relative %: 41.2 % — ABNORMAL LOW (ref 43.0–77.0)
Platelets: 265 K/uL (ref 150.0–400.0)
RBC: 4.26 Mil/uL (ref 3.87–5.11)
RDW: 13.3 % (ref 11.5–15.5)
WBC: 5.5 K/uL (ref 4.0–10.5)

## 2024-04-11 LAB — URINALYSIS, ROUTINE W REFLEX MICROSCOPIC
Bilirubin Urine: NEGATIVE
Ketones, ur: NEGATIVE
Nitrite: NEGATIVE
Specific Gravity, Urine: 1.01 (ref 1.000–1.030)
Total Protein, Urine: NEGATIVE
Urine Glucose: NEGATIVE
Urobilinogen, UA: 0.2 (ref 0.0–1.0)
pH: 6 (ref 5.0–8.0)

## 2024-04-11 LAB — TSH: TSH: 4.25 u[IU]/mL (ref 0.35–5.50)

## 2024-04-11 LAB — CORTISOL: Cortisol, Plasma: 9.9 ug/dL

## 2024-04-11 LAB — CK: Total CK: 71 U/L (ref 17–177)

## 2024-04-11 MED ORDER — LISDEXAMFETAMINE DIMESYLATE 20 MG PO CAPS
20.0000 mg | ORAL_CAPSULE | Freq: Every day | ORAL | 0 refills | Status: AC
  Filled 2024-04-11 (×2): qty 30, 30d supply, fill #0

## 2024-04-11 NOTE — Progress Notes (Unsigned)
 "  Subjective:  Patient ID: Kayla [name], female    DOB: Apr 12, 1950  Age: 74 y.o. MRN: [id number]  CC: Hypothyroidism   HPI Kayla [name] presents for f/up  ---  Discussed the use of AI scribe software for clinical note transcription with the patient, who gave verbal consent to proceed.  History of Present Illness Kayla [name] is a 74 year old female who presents with persistent groin pain.  She experiences persistent groin pain that began suddenly and is exacerbated by exercise. She reports that an MRI of her groin was performed, but she was told there was nothing there. Despite this, she continues to experience pain, especially during physical activities. She is currently participating in swim therapy on Tuesday, Thursday, and Sunday. However, exercise often leaves her feeling fatigued and exacerbates her groin pain.  She has a history of recurrent pneumonia and was previously prescribed erythromycin by an infectious disease specialist. Since resuming IVIG therapy, she has not had any infections. No respiratory symptoms such as trouble breathing, coughing, wheezing, or shortness of breath. She has stopped using supplemental oxygen  at night and is awaiting a device to test her oxygen  levels during sleep.  She experiences muscle and joint aches but is unsure of the cause. No new or different symptoms related to these aches.  She feels tired and fatigued, despite sleeping better than ever. No weakness, dizziness, or lightheadedness, and does not feel her heart beating irregularly.     Outpatient Medications Prior to Visit  Medication Sig Dispense Refill   albuterol  (PROVENTIL ) (2.5 MG/3ML) 0.083% nebulizer solution Take 3 mLs (2.5 mg total) by nebulization as directed. 180 mL 1   azithromycin  (ZITHROMAX ) 250 MG tablet Take 1 tablet (250 mg total) by mouth daily. 30 each 5   baclofen  (LIORESAL ) 10 MG tablet Take 1 tablet (10 mg total) by mouth in the morning. 30 each 5   bisacodyl   (DULCOLAX) 5 MG EC tablet Take 5 mg by mouth See admin instructions. Take 5 mg by mouth in the morning and evening     budesonide  (PULMICORT ) 0.5 MG/2ML nebulizer solution Take 2 mLs (0.5 mg total) by nebulization in the morning and at bedtime. Brush teeth after treatment. 360 mL 1   calcium  carbonate (OS-CAL - DOSED IN MG OF ELEMENTAL CALCIUM ) 1250 (500 Ca) MG tablet Take 1 tablet (1,250 mg total) by mouth daily with breakfast.     Coenzyme Q10 (VITALINE COQ10) 300 MG WAFR Take 300 mg by mouth in the morning.     dexlansoprazole  (DEXILANT ) 60 MG capsule Take 1 capsule (60 mg total) by mouth daily with breakfast. 90 capsule 0   famotidine  (PEPCID ) 20 MG tablet One after supper 30 tablet 11   Ferric Maltol  (ACCRUFER ) 30 MG CAPS Take 1 capsule (30 mg total) by mouth in the morning and at bedtime. 180 capsule 1   fluconazole  (DIFLUCAN ) 200 MG tablet Take 200 mg by mouth once a week.     FLUoxetine  (PROZAC ) 40 MG capsule Take 1 capsule (40 mg total) by mouth daily. 90 capsule 0   formoterol  (PERFOROMIST ) 20 MCG/2ML nebulizer solution Take 2 mLs (20 mcg total) by nebulization at bedtime. Brush teeth after treatment. 360 mL 1   guaiFENesin  (MUCINEX ) 600 MG 12 hr tablet Take 1 tablet (600 mg total) by mouth 2 (two) times daily. 30 tablet 0   levothyroxine  (SYNTHROID ) 100 MCG tablet TAKE 1 TABLET BY MOUTH ONCE DAILY BEFORE BREAKFAST 90 tablet 1   lisdexamfetamine  (VYVANSE )  20 MG capsule Take 1 capsule (20 mg total) by mouth daily. 30 capsule 0   MAGNESIUM  PO Take 1 tablet by mouth every evening.     methenamine  (HIPREX ) 1 g tablet Take 1 tablet (1 g total) by mouth 2 (two) times daily with a meal. 180 tablet 0   mirabegron  ER (MYRBETRIQ ) 50 MG TB24 tablet Take 50 mg by mouth in the morning.     mirtazapine  (REMERON ) 15 MG tablet TAKE ONE TABLET BY MOUTH AT BEDTIME 90 tablet 1   NON FORMULARY Take 1 tablet by mouth See admin instructions. [employer] Mature Multivitamin with Calcium - Take 1 tablet by  mouth with breakfast in the morning     NON FORMULARY Take 1 capsule by mouth See admin instructions. [employer] probiotic advanced  digestive probiotic- Take 1 capsule by mouth in the morning     PRESCRIPTION MEDICATION Apply 1 Device topically See admin instructions. Afflovest- For 30 minutes in the morning and evening- to begin @ 60% and work up to 80%     Respiratory Therapy Supplies (FLUTTER) DEVI Use as directed 1 each 0   Respiratory Therapy Supplies (NEBULIZER MASK ADULT) MISC 1 Device by Does not apply route as directed. 1 each 2   revefenacin  (YUPELRI ) 175 MCG/3ML nebulizer solution Take 3 mLs (175 mcg total) by nebulization every morning. Brush teeth after treatment. 270 mL 1   sodium chloride  HYPERTONIC 3 % nebulizer solution Take by nebulization 2 (two) times daily as needed for cough. Dx J44.9 750 mL 1   tamsulosin  (FLOMAX ) 0.4 MG CAPS capsule Take 0.4 mg by mouth in the morning.     temazepam  (RESTORIL ) 15 MG capsule Take 1 capsule (15 mg total) by mouth every evening. 90 capsule 0   torsemide  (DEMADEX ) 10 MG tablet Take 1 tablet (10 mg total) by mouth daily. 90 tablet 1   lisdexamfetamine  (VYVANSE ) 10 MG capsule Take 10 mg by mouth daily as needed.     OXYGEN  Inhale 2-3.5 L/min into the lungs continuous. (Patient not taking: Reported on 04/11/2024)     amphetamine -dextroamphetamine  (ADDERALL) 20 MG tablet Take 20 mg by mouth 2 (two) times daily. (Patient not taking: Reported on 04/11/2024)     No facility-administered medications prior to visit.    ROS Review of Systems  Constitutional:  Positive for fatigue. Negative for appetite change, chills, diaphoresis, fever and unexpected weight change.  HENT: Negative.  Negative for sore throat and trouble swallowing.   Eyes: Negative.   Respiratory: Negative.  Negative for cough, chest tightness, shortness of breath and wheezing.   Cardiovascular:  Negative for chest pain, palpitations and leg swelling.  Gastrointestinal: Negative.   Negative for abdominal pain, constipation, diarrhea, nausea and vomiting.  Endocrine: Negative.   Genitourinary:  Positive for pelvic pain. Negative for decreased urine volume, difficulty urinating, dysuria, frequency, hematuria and urgency.  Musculoskeletal:  Positive for arthralgias, gait problem and myalgias. Negative for joint swelling.  Skin: Negative.   Neurological:  Negative for dizziness, weakness and light-headedness.  Hematological:  Negative for adenopathy. Does not bruise/bleed easily.  Psychiatric/Behavioral:  Positive for sleep disturbance. Negative for confusion, decreased concentration and dysphoric mood. The patient is not nervous/anxious.     Objective:  BP 114/62 (BP Location: Right Arm, Patient Position: Sitting, Cuff Size: Normal)   Pulse 83   Temp 97.7 F (36.5 C) (Oral)   Resp 16   Ht 5' 3 (1.6 m)   Wt 123 lb 12.8 oz (56.2 kg)   SpO2 95%  BMI 21.93 kg/m   BP Readings from Last 3 Encounters:  04/11/24 114/62  03/28/24 120/62  02/09/24 (!) 112/55    Wt Readings from Last 3 Encounters:  04/11/24 123 lb 12.8 oz (56.2 kg)  03/28/24 123 lb 6.4 oz (56 kg)  01/26/24 125 lb 6.4 oz (56.9 kg)    Physical Exam Vitals reviewed.  Constitutional:      General: She is not in acute distress.    Appearance: Normal appearance. She is ill-appearing. She is not toxic-appearing or diaphoretic.  HENT:     Nose: Nose normal.     Mouth/Throat:     Mouth: Mucous membranes are moist.  Eyes:     General: No scleral icterus.    Conjunctiva/sclera: Conjunctivae normal.  Cardiovascular:     Rate and Rhythm: Normal rate and regular rhythm.     Pulses: Normal pulses.     Heart sounds: No murmur heard.    No friction rub. No gallop.     Comments: EKG --- NSR, 77 bpm Indeterminate axis Q wave in III is new   Pulmonary:     Effort: Pulmonary effort is normal.     Breath sounds: No stridor. No wheezing, rhonchi or rales.  Abdominal:     General: Abdomen is scaphoid.  Bowel sounds are normal. There is no distension.     Palpations: Abdomen is soft. There is no hepatomegaly, splenomegaly or mass.     Tenderness: There is no abdominal tenderness. There is no guarding or rebound.  Musculoskeletal:     Cervical back: Neck supple.     Right lower leg: No edema.     Left lower leg: No edema.  Lymphadenopathy:     Cervical: No cervical adenopathy.  Skin:    General: Skin is warm and dry.  Neurological:     General: No focal deficit present.     Mental Status: She is alert.     Lab Results  Component Value Date   WBC 5.5 04/11/2024   HGB 13.3 04/11/2024   HCT 39.0 04/11/2024   PLT 265.0 04/11/2024   GLUCOSE 89 04/11/2024   CHOL 198 06/08/2023   TRIG 72.0 06/08/2023   HDL 74.40 06/08/2023   LDLDIRECT 116.1 06/25/2010   LDLCALC 109 (H) 06/08/2023   ALT 15 10/08/2023   AST 29 10/08/2023   NA 134 (L) 04/11/2024   K 4.0 04/11/2024   CL 96 04/11/2024   CREATININE 0.87 04/11/2024   BUN 32 (H) 04/11/2024   CO2 31 04/11/2024   TSH 4.25 04/11/2024   INR 1.2 10/18/2022   HGBA1C 5.6 02/25/2013    CT CHEST HIGH RESOLUTION Result Date: 03/04/2023 CLINICAL DATA:  74 year old female with history of persistent chronic cough. Possible pneumonia. EXAM: CT CHEST WITHOUT CONTRAST TECHNIQUE: Multidetector CT imaging of the chest was performed following the standard protocol without intravenous contrast. High resolution imaging of the lungs, as well as inspiratory and expiratory imaging, was performed. RADIATION DOSE REDUCTION: This exam was performed according to the departmental dose-optimization program which includes automated exposure control, adjustment of the mA and/or kV according to patient size and/or use of iterative reconstruction technique. COMPARISON:  Chest CT 08/31/2022. FINDINGS: Cardiovascular: Heart size is normal. There is no significant pericardial fluid, thickening or pericardial calcification. There is aortic atherosclerosis, as well as  atherosclerosis of the great vessels of the mediastinum and the coronary arteries, including calcified atherosclerotic plaque in the right coronary artery. Mediastinum/Nodes: Prominent AP window lymph node measuring up to 1.1  cm in short axis, similar to prior studies, presumably benign and reactive. No other definite pathologically enlarged mediastinal or hilar lymph nodes are noted. Esophagus is unremarkable in appearance. No axillary lymphadenopathy. Lungs/Pleura: High-resolution imaging demonstrates some patchy areas of ground-glass attenuation, septal thickening, thickening of the peribronchovascular interstitium and scattered areas of cylindrical bronchiectasis. No honeycombing noted. Bronchiectasis is most severe in the left lower lobe where there is extensive mucoid impaction and extensive peribronchovascular architectural distortion, likely to reflect areas of chronic post infectious or inflammatory scarring. Inspiratory and expiratory imaging demonstrates some mild air trapping indicative of mild small airways disease. Small amount of consolidation is noted in the left lower lobe suggesting active pneumonia. No pleural effusions. Upper Abdomen: Unremarkable. Musculoskeletal: There are no aggressive appearing lytic or blastic lesions noted in the visualized portions of the skeleton. IMPRESSION: 1. There is a spectrum of findings in the lungs considered most compatible with an alternative diagnosis (not usual interstitial pneumonia) per current ATS guidelines. 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 2. Airspace consolidation in the left lower lobe in the midst of an area of extensive bronchiectasis, indicative of pneumonia. 3. Mild air trapping indicative of mild small airways disease. 4. Aortic atherosclerosis, in addition to right coronary artery disease. Please note that although the presence of coronary artery calcium  documents the presence of coronary artery  disease, the severity of this disease and any potential stenosis cannot be assessed on this non-gated CT examination. Assessment for potential risk factor modification, dietary therapy or pharmacologic therapy may be warranted, if clinically indicated. Aortic Atherosclerosis (ICD10-I70.0). Electronically Signed   By: Toribio Aye M.D.   On: 03/04/2023 06:48    Assessment & Plan:   Hypothyroidism- She is euthyroid. -     TSH; Future -     CK; Future  Hyponatremia -     Basic metabolic panel with GFR; Future -     Cortisol; Future -     Urinalysis, Routine w reflex microscopic; Future -     Sodium, urine, random; Future  Chronic respiratory failure with hypoxia (HCC)- Doing well without oxygen .  Iron deficiency anemia secondary to inadequate dietary iron intake -     CBC with Differential/Platelet; Future -     IBC + Ferritin; Future  Hypotensive episode -     EKG 12-Lead -     Cortisol; Future -     ECHOCARDIOGRAM COMPLETE; Future  Abnormal EKG -     ECHOCARDIOGRAM COMPLETE; Future     Follow-up: Return in about 3 months (around 07/10/2024).  Debby Molt, MD "

## 2024-04-11 NOTE — Patient Instructions (Signed)

## 2024-04-12 LAB — SODIUM, URINE, RANDOM: Sodium, Ur: 70 mmol/L (ref 28–272)

## 2024-04-14 ENCOUNTER — Encounter: Payer: Self-pay | Admitting: Physical Therapy

## 2024-04-14 ENCOUNTER — Ambulatory Visit: Admitting: Physical Therapy

## 2024-04-14 ENCOUNTER — Ambulatory Visit (HOSPITAL_COMMUNITY)
Admission: RE | Admit: 2024-04-14 | Discharge: 2024-04-14 | Disposition: A | Discharge status: Home / Self Care | Source: Ambulatory Visit | Attending: Cardiology | Admitting: Cardiology

## 2024-04-14 DIAGNOSIS — I959 Hypotension, unspecified: Secondary | ICD-10-CM | POA: Diagnosis present

## 2024-04-14 DIAGNOSIS — R2681 Unsteadiness on feet: Secondary | ICD-10-CM

## 2024-04-14 DIAGNOSIS — R9431 Abnormal electrocardiogram [ECG] [EKG]: Secondary | ICD-10-CM | POA: Insufficient documentation

## 2024-04-14 DIAGNOSIS — R262 Difficulty in walking, not elsewhere classified: Secondary | ICD-10-CM

## 2024-04-14 DIAGNOSIS — M6281 Muscle weakness (generalized): Secondary | ICD-10-CM

## 2024-04-14 NOTE — Therapy (Signed)
 " OUTPATIENT PHYSICAL THERAPY NEURO TREATMENT   Patient Name: Kayla [name] MRN: [id number] DOB:1950/07/22, 74 y.o., female Today's Date: 04/14/2024   PCP: Debby FREDRIK Molt MD REFERRING PROVIDER: Murl Cough, MD   END OF SESSION:  PT End of Session - 04/14/24 1116     Visit Number 15    Number of Visits 25   13 + 12   Date for Recertification  05/27/24   pushed out due to potential scheduling changes   Authorization Type mcr    Progress Note Due on Visit 10    PT Start Time 1103    PT Stop Time 1146    PT Time Calculation (min) 43 min    Equipment Utilized During Treatment Other (comment)   aquatic devices as needed for safety and challenge   Activity Tolerance Patient tolerated treatment well    Behavior During Therapy WFL for tasks assessed/performed          Past Medical History:  Diagnosis Date   Arthritis    oa   Asthma    Bilateral vocal cord paralysis    Bronchiectasis (HCC)    Cervical cancer (HCC) 2005   Depression    GERD (gastroesophageal reflux disease)    Hiatal hernia    Hypothyroidism    Immunodeficiency    Gets IVIG infusions   Insomnia    Multiple sclerosis 1996   Osteoporosis    Pneumonia 2024, 2014, 1992   Urine retention    Past Surgical History:  Procedure Laterality Date   ABDOMINAL HYSTERECTOMY  03/25/2003   for cervical cancer, complete   BRONCHIAL BIOPSY  10/26/2020   Procedure: BRONCHIAL BIOPSIES;  Surgeon: Brenna Adine CROME, DO;  Location: [facility];  Service: Pulmonary;;   BRONCHIAL BRUSHINGS  10/26/2020   Procedure: BRONCHIAL BRUSHINGS;  Surgeon: Brenna Adine CROME, DO;  Location: [facility];  Service: Pulmonary;;   BRONCHIAL WASHINGS  10/26/2020   Procedure: BRONCHIAL WASHINGS;  Surgeon: Brenna Adine CROME, DO;  Location: [facility];  Service: Pulmonary;;   BRONCHIAL WASHINGS  08/19/2022   Procedure: BRONCHIAL WASHINGS;  Surgeon: Brenna Adine CROME, DO;  Location: [facility];  Service: Cardiopulmonary;;   CATARACT  EXTRACTION, BILATERAL Bilateral    lens for cataracts   CONVERSION TO TOTAL HIP Right 09/18/2015   Procedure: CONVERSION OF RIGHT HEMI ARTHROPLASTY TO TOTAL HIP ARTHROPLASTY ACETABULAR REVISON ;  Surgeon: Cough Car, MD;  Location: [facility];  Service: Orthopedics;  Laterality: Right;   HIP ARTHROPLASTY Right 03/01/2013   Procedure: ARTHROPLASTY BIPOLAR HIP;  Surgeon: Cough JONETTA Car, MD;  Location: [facility];  Service: Orthopedics;  Laterality: Right;   HIP CLOSED REDUCTION Right 03/12/2013   Procedure: CLOSED REDUCTION HIP;  Surgeon: Dempsey LULLA Moan, MD;  Location: [facility];  Service: Orthopedics;  Laterality: Right;   HIP CLOSED REDUCTION Right 03/19/2013   Procedure: CLOSED REDUCTION HIP;  Surgeon: Reyes JAYSON Billing, MD;  Location: [facility];  Service: Orthopedics;  Laterality: Right;   HIP CLOSED REDUCTION Right 11/10/2015   Procedure: CLOSED MANIPULATION HIP;  Surgeon: Redell Shoals, MD;  Location: [facility];  Service: Orthopedics;  Laterality: Right;   HIP CLOSED REDUCTION Right 06/05/2017   Procedure: CLOSED REDUCTION HIP;  Surgeon: Heide Ingle, MD;  Location: [facility];  Service: Orthopedics;  Laterality: Right;   SMALL INTESTINE SURGERY     TEE WITHOUT CARDIOVERSION N/A 09/22/2022   Procedure: TRANSESOPHAGEAL ECHOCARDIOGRAM;  Surgeon: Okey Vina LULLA, MD;  Location: [facility];  Service: Cardiovascular;  Laterality: N/A;  TOTAL HIP REVISION Right 06/06/2017   Procedure: TOTAL HIP REVISION;  Surgeon: Heide Ingle, MD;  Location: [facility];  Service: Orthopedics;  Laterality: Right;   VESICOVAGINAL FISTULA CLOSURE W/ TAH  03/25/2003   VIDEO BRONCHOSCOPY N/A 10/26/2020   Procedure: VIDEO BRONCHOSCOPY WITH FLUORO;  Surgeon: Brenna Adine CROME, DO;  Location: [facility];  Service: Pulmonary;  Laterality: N/A;   VIDEO BRONCHOSCOPY Left 08/19/2022   Procedure: VIDEO BRONCHOSCOPY WITH FLUORO; WITH BAL;  Surgeon: Brenna Adine CROME, DO;  Location: [facility];  Service: Cardiopulmonary;  Laterality:  Left;   Patient Active Problem List   Diagnosis Date Noted   Abnormal EKG 04/11/2024   Mild protein malnutrition 10/18/2022   Chronic respiratory failure with hypoxia (HCC) 09/29/2022   Hypogammaglobulinemia 09/22/2022   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) 07/08/2022   GERD (gastroesophageal reflux disease) 06/20/2022   Pulmonary mycobacterial infection (HCC) 09/24/2021   Bilateral vocal cord paralysis 09/24/2021   UTI (urinary tract infection) 05/24/2021   COPD (chronic obstructive pulmonary disease) (HCC)    Iron deficiency anemia secondary to inadequate dietary iron intake 12/20/2020   Purple toe syndrome of both feet (HCC) 07/26/2020   Hypotensive episode 06/07/2017   Cough variant asthma vs vcd  related to MS  11/10/2016   Positional sleep apnea 09/10/2016   Periodic limb movements of sleep 09/10/2016   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/21/2013   Constipation 02/26/2013   Hyponatremia 05/20/2012   Post-menopausal osteoporosis 09/17/2007   Hypothyroidism 04/01/2007   Multiple sclerosis 04/01/2007     ONSET DATE: chronic  REFERRING DIAG:  G35 (ICD-10-CM) - Multiple sclerosis  G82.50 (ICD-10-CM) - Quadriplegia, unspecified    THERAPY DIAG:  Muscle weakness (generalized)  Unsteadiness on feet  Difficulty in walking, not elsewhere classified  Rationale for Evaluation and Treatment: Rehabilitation  SUBJECTIVE:                                                                                                                                                                                             SUBJECTIVE STATEMENT: Pt presents to [facility] in transport chair w/ 2WW for transfers.  She reports no recent falls.  Pt reports neutral response to TPDN last session though initially questions some positive benefit, but nothing long-term.  She has similar pain this visit to prior.  She has a echo scheduled for this afternoon due to low BP (asymptomatic) at prior  PCP appt.  Pt accompanied by: self (husband dropped her off)  PERTINENT HISTORY: MS   PAIN:  Are you having pain? Yes: NPRS scale: 3/10 Pain location: bilateral hips esp right anterior groin, low back,  and feet Pain description: sore, achy Aggravating factors: walking - feels like it might give out Relieving factors: rest  PRECAUTIONS: Fall  RED FLAGS: None   WEIGHT BEARING RESTRICTIONS: No  FALLS: Has patient fallen in last 6 months? No  LIVING ENVIRONMENT: Lives with: lives with their spouse Lives in: House/apartment Stairs: 4 about home Has following equipment at home: Environmental Consultant - 4 wheeled, Shower bench, Grab bars, and modified vehicle with hand controls, scooter  PLOF: Independent with community mobility with device  PATIENT GOALS: improve balance; walk better, be able to stand and cook  OBJECTIVE:  Note: Objective measures were completed at Evaluation unless otherwise noted.  COGNITION: Overall cognitive status: Within functional limits for tasks assessed   SENSATION: impaired  COORDINATION: LE impaired  MUSCLE TONE: LE impaired  MUSCLE LENGTH: hyper knees into ext  DTRs: TBT  POSTURE: anterior pelvic tilt, right pelvic obliquity, weight shift left, and bilateral hyperextension of knees  LOWER EXTREMITY ROM:     Wfl  LOWER EXTREMITY MMT:    MMT Right Eval Left Eval  Hip flexion 3- 3-  Hip extension    Hip abduction    Hip adduction    Hip internal rotation    Hip external rotation    Knee flexion 3 3  Knee extension 3 3  Ankle dorsiflexion 2 3  Ankle plantarflexion    Ankle inversion    Ankle eversion    (Blank rows = not tested)  BED MOBILITY:  indep  TRANSFERS: Sit to stand: Modified independence  Assistive device utilized: Environmental Consultant - 4 wheeled    heavy ue support   STAIRS: 4 steps occasionally used with hand rails indep GAIT: Findings: Gait Characteristics: Right hip hike, genu recurvatum- Right, genu recurvatum- Left,  trendelenburg, and trunk flexed, Distance walked: 500 ft, Assistive device utilized:Walker - 4 wheeled, Level of assistance: Modified independence  FUNCTIONAL TESTS:  Timed up and go (TUG): 46.46 2 MWT128 ft using rollator 4 stage balance: FT close supervision; semi tandem, tandem and SLS unable to complete  PATIENT SURVEYS:  tba                                                                                                                              TREATMENT  -Water  walking warmup 4x18 ft forward > backwards > laterally -Wall supported hip circles CW > CCW x20 each direction each LE -3-way hip 2x10 each direction each LE w/ unilateral wall support -Standing march progressing from BUE support to none 3x20 -Pool wall plank kickbacks 2x20 -Pool wall supported hamstring curls x12 each LE -mini squats w/ weighted ball x15  PATIENT EDUCATION: Education details:  Continue pain management techniques at home.  Continue HEP.  Continue Ai Chi, plan for aquatic HEP.  Person educated: Patient Education method: Explanation Education comprehension: verbalized understanding  HOME EXERCISE PROGRAM: Access Code: [email or web address] URL: [email or web address] Date: 02/05/2024 Prepared by: Daved Bull  Exercises - Corner Balance Feet Together With  Eyes Open  - 1 x daily - 4 x weekly - 1 sets - 2-3 reps - 30 seconds hold - Standing Near Stance in [place] with Eyes Closed  - 1 x daily - 4 x weekly - 1 sets - 2-3 reps - 30 seconds hold - Heel Raises with Counter Support  - 1 x daily - 4 x weekly - 1-2 sets - 5-6 reps - Squat with Chair Touch  - 1 x daily - 4 x weekly - 2 sets - 10 reps - Side Stepping with Resistance at Thighs and Counter Support  - 1 x daily - 4 x weekly - 3 sets - 10 reps  GOALS: Goals reviewed with patient? Yes  SHORT TERM GOALS: Target date: 02/21/24  Pt will tolerate full aquatic sessions consistently without increase in pain and with improving function to  demonstrate good toleration and effectiveness of intervention.  Baseline:  tolerating well (12/8) Goal status: MET    LONG TERM GOALS: Target date: 03/18/25  Pt will be indep with final Aquatic HEP for continued management of condition Baseline: In progress (1/8) Goal status: IN PROGRESS  2.  Pt will improve to at least 11ft  to demonstrate improved ability to ambulate into and out of MD offices/clinics. (MCD = 49ft) Baseline: 128 ft; unable to assess (1/8) Goal status: NOT MET  3.  Pt will improve on Tug test to <or=  40s to demonstrate improvement in lower extremity function, mobility and decreased fall risk. Baseline: 46.46; unable to assess (1/8) Goal status: NOT MET  4.  Pt will report improved walking ability and balance with daily function. Baseline: pain impacted tolerance and function (1/8) Goal status: NOT MET  5.  Pt will demonstrate mindfulness with posture for improved alignment Baseline: Pt is mindful of posture but has difficulty due to pain currently (1/8) Goal status: PARTIALLY MET  GOALS (at 1/8 re-cert): Goals reviewed with patient? Yes  SHORT TERM GOALS: Target date: 04/22/2024  Pt will demonstrate improved ind w/ ai chi postures and initial aquatic HEP.  Baseline:  Ai chi provided, HEP to be provided (1/8) Goal status: INITIAL  LONG TERM GOALS: Target date: 05/13/2024  Pt will be indep with final Aquatic HEP for continued management of condition Baseline: In progress (1/8) Goal status: IN PROGRESS  2.  Pt will trial TPDN for pain management to improve posture and functional capacity. Baseline: will reschedule missed appt (1/8) Goal status: INITIAL  ASSESSMENT:  CLINICAL IMPRESSION: Focus of skilled aquatic session today on addressing hip strength and pain management.  She did not find much relief from TPDN last session, but reports no adverse response either.  She continues to be conscious of RLE positioning and has best success maintaining  appropriate posture with wide mini squats at end of session.  Still planning to establish an aquatic HEP to compliment the ai chi program previously provided.  Will continue per POC.  OBJECTIVE IMPAIRMENTS: Abnormal gait, decreased activity tolerance, decreased balance, decreased coordination, decreased endurance, decreased mobility, difficulty walking, decreased strength, impaired tone, and postural dysfunction.   ACTIVITY LIMITATIONS: carrying, lifting, bending, sitting, standing, squatting, stairs, transfers, and locomotion level  PARTICIPATION LIMITATIONS: meal prep, cleaning, laundry, shopping, community activity, yard work, and cooking  PERSONAL FACTORS: Time since onset of injury/illness/exacerbation are also affecting patient's functional outcome.   REHAB POTENTIAL: Fair -good for stated goals  CLINICAL DECISION MAKING: Stable/uncomplicated  EVALUATION COMPLEXITY: Low  PLAN:  PT FREQUENCY: 1-2x/week  PT DURATION: 6 weeks  PLANNED INTERVENTIONS: 97164- PT Re-evaluation, 97750- Physical Performance Testing, 97110-Therapeutic exercises, 97530- Therapeutic activity, [id number]- Neuromuscular re-education, 97535- Self Care, [id number]- Manual therapy, [phone]- Gait training, [phone]- Aquatic Therapy, [phone]- Ionotophoresis 4mg /ml Dexamethasone , [id number] (1-2 muscles), [id number] (3+ muscles)- Dry Needling, Patient/Family education, Balance training, Stair training, Taping, Joint mobilization, DME instructions, Cryotherapy, and Moist heat  PLAN FOR NEXT SESSION: aquatics: strengthening; balance retraining  Aquatic HEP - Planning to D/C from pool at last appt  Daved Bull, PT, DPT           "

## 2024-04-15 LAB — ECHOCARDIOGRAM COMPLETE
Area-P 1/2: 2.57 cm2
S' Lateral: 2.3 cm

## 2024-04-19 ENCOUNTER — Other Ambulatory Visit: Payer: Self-pay | Admitting: Internal Medicine

## 2024-04-19 DIAGNOSIS — F5104 Psychophysiologic insomnia: Secondary | ICD-10-CM

## 2024-04-21 ENCOUNTER — Ambulatory Visit: Admitting: Physical Therapy

## 2024-04-21 ENCOUNTER — Encounter: Payer: Self-pay | Admitting: Physical Therapy

## 2024-04-21 DIAGNOSIS — R262 Difficulty in walking, not elsewhere classified: Secondary | ICD-10-CM

## 2024-04-21 DIAGNOSIS — R2681 Unsteadiness on feet: Secondary | ICD-10-CM

## 2024-04-21 DIAGNOSIS — M6281 Muscle weakness (generalized): Secondary | ICD-10-CM

## 2024-04-21 NOTE — Patient Instructions (Signed)
 Access Code: [email or web address] URL: [email or web address] Date: 04/21/2024 Prepared by: Daved Bull  Exercises - Heel Toe Raises at [facility]  - 1 x daily - 7 x weekly - 3 sets - 10 reps - Squat  - 1 x daily - 7 x weekly - 3 sets - 10 reps - Side Stepping  - 1 x daily - 7 x weekly - 3 sets - 10 reps - Forward Walking  - 1 x daily - 7 x weekly - 3 sets - 10 reps - Backward Walking  - 1 x daily - 7 x weekly - 3 sets - 10 reps - Standing Knee Flexion  - 1 x daily - 7 x weekly - 3 sets - 10 reps - Standing Hip Circles at [employer]  - 1 x daily - 7 x weekly - 3 sets - 10 reps - Forward March  - 1 x daily - 7 x weekly - 3 sets - 10 reps - Lunge to Target at [employer]  - 1 x daily - 7 x weekly - 3 sets - 10 reps - Plank with Hip Extension at [employer]  - 1 x daily - 7 x weekly - 3 sets - 10 reps - [place] in [place]  with Pool Noodle  - 1 x daily - 7 x weekly - 3 sets - 10 reps - Chair Pose in [place]  with Pool Noodle  - 1 x daily - 7 x weekly - 3 sets - 10 reps - [place] Pose in [place]  with Pool Noodle  - 1 x daily - 7 x weekly - 3 sets - 10 reps

## 2024-04-21 NOTE — Therapy (Signed)
 " OUTPATIENT PHYSICAL THERAPY NEURO TREATMENT   Patient Name: Kayla [name] MRN: [id number] DOB:1951-02-15, 74 y.o., female Today's Date: 04/21/2024   PCP: Debby FREDRIK Molt MD REFERRING PROVIDER: Murl Cough, MD   END OF SESSION:  PT End of Session - 04/21/24 1159     Visit Number 16    Number of Visits 25   13 + 12   Date for Recertification  05/27/24   pushed out due to potential scheduling changes   Authorization Type mcr    Progress Note Due on Visit 10    PT Start Time 1147    PT Stop Time 1232    PT Time Calculation (min) 45 min    Equipment Utilized During Treatment Other (comment)   aquatic devices as needed for safety and challenge   Activity Tolerance Patient tolerated treatment well    Behavior During Therapy [facility] for tasks assessed/performed          Past Medical History:  Diagnosis Date   Arthritis    oa   Asthma    Bilateral vocal cord paralysis    Bronchiectasis (HCC)    Cervical cancer (HCC) 2005   Depression    GERD (gastroesophageal reflux disease)    Hiatal hernia    Hypothyroidism    Immunodeficiency    Gets IVIG infusions   Insomnia    Multiple sclerosis 1996   Osteoporosis    Pneumonia 2024, 2014, 1992   Urine retention    Past Surgical History:  Procedure Laterality Date   ABDOMINAL HYSTERECTOMY  03/25/2003   for cervical cancer, complete   BRONCHIAL BIOPSY  10/26/2020   Procedure: BRONCHIAL BIOPSIES;  Surgeon: Brenna Adine CROME, DO;  Location: [facility];  Service: Pulmonary;;   BRONCHIAL BRUSHINGS  10/26/2020   Procedure: BRONCHIAL BRUSHINGS;  Surgeon: Brenna Adine CROME, DO;  Location: [facility];  Service: Pulmonary;;   BRONCHIAL WASHINGS  10/26/2020   Procedure: BRONCHIAL WASHINGS;  Surgeon: Brenna Adine CROME, DO;  Location: [facility];  Service: Pulmonary;;   BRONCHIAL WASHINGS  08/19/2022   Procedure: BRONCHIAL WASHINGS;  Surgeon: Brenna Adine CROME, DO;  Location: [facility];  Service: Cardiopulmonary;;   CATARACT  EXTRACTION, BILATERAL Bilateral    lens for cataracts   CONVERSION TO TOTAL HIP Right 09/18/2015   Procedure: CONVERSION OF RIGHT HEMI ARTHROPLASTY TO TOTAL HIP ARTHROPLASTY ACETABULAR REVISON ;  Surgeon: Cough Car, MD;  Location: [facility];  Service: Orthopedics;  Laterality: Right;   HIP ARTHROPLASTY Right 03/01/2013   Procedure: ARTHROPLASTY BIPOLAR HIP;  Surgeon: Cough JONETTA Car, MD;  Location: [facility];  Service: Orthopedics;  Laterality: Right;   HIP CLOSED REDUCTION Right 03/12/2013   Procedure: CLOSED REDUCTION HIP;  Surgeon: Dempsey LULLA Moan, MD;  Location: [facility];  Service: Orthopedics;  Laterality: Right;   HIP CLOSED REDUCTION Right 03/19/2013   Procedure: CLOSED REDUCTION HIP;  Surgeon: Reyes JAYSON Billing, MD;  Location: [facility];  Service: Orthopedics;  Laterality: Right;   HIP CLOSED REDUCTION Right 11/10/2015   Procedure: CLOSED MANIPULATION HIP;  Surgeon: Redell Shoals, MD;  Location: [facility];  Service: Orthopedics;  Laterality: Right;   HIP CLOSED REDUCTION Right 06/05/2017   Procedure: CLOSED REDUCTION HIP;  Surgeon: Heide Ingle, MD;  Location: [facility];  Service: Orthopedics;  Laterality: Right;   SMALL INTESTINE SURGERY     TEE WITHOUT CARDIOVERSION N/A 09/22/2022   Procedure: TRANSESOPHAGEAL ECHOCARDIOGRAM;  Surgeon: Okey Vina LULLA, MD;  Location: [facility] IN[facility]AS[facility] CV [facility];  Service: Cardiovascular;  Laterality: N/A;  TOTAL HIP REVISION Right 06/06/2017   Procedure: TOTAL HIP REVISION;  Surgeon: Heide Ingle, MD;  Location: [facility];  Service: Orthopedics;  Laterality: Right;   VESICOVAGINAL FISTULA CLOSURE W/ TAH  03/25/2003   VIDEO BRONCHOSCOPY N/A 10/26/2020   Procedure: VIDEO BRONCHOSCOPY WITH FLUORO;  Surgeon: Brenna Adine CROME, DO;  Location: [facility];  Service: Pulmonary;  Laterality: N/A;   VIDEO BRONCHOSCOPY Left 08/19/2022   Procedure: VIDEO BRONCHOSCOPY WITH FLUORO; WITH BAL;  Surgeon: Brenna Adine CROME, DO;  Location: [facility];  Service: Cardiopulmonary;  Laterality:  Left;   Patient Active Problem List   Diagnosis Date Noted   Abnormal EKG 04/11/2024   Mild protein malnutrition 10/18/2022   Chronic respiratory failure with hypoxia (HCC) 09/29/2022   Hypogammaglobulinemia 09/22/2022   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) 07/08/2022   GERD (gastroesophageal reflux disease) 06/20/2022   Pulmonary mycobacterial infection (HCC) 09/24/2021   Bilateral vocal cord paralysis 09/24/2021   UTI (urinary tract infection) 05/24/2021   COPD (chronic obstructive pulmonary disease) (HCC)    Iron deficiency anemia secondary to inadequate dietary iron intake 12/20/2020   Purple toe syndrome of both feet (HCC) 07/26/2020   Hypotensive episode 06/07/2017   Cough variant asthma vs vcd  related to MS  11/10/2016   Positional sleep apnea 09/10/2016   Periodic limb movements of sleep 09/10/2016   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/21/2013   Constipation 02/26/2013   Hyponatremia 05/20/2012   Post-menopausal osteoporosis 09/17/2007   Hypothyroidism 04/01/2007   Multiple sclerosis 04/01/2007     ONSET DATE: chronic  REFERRING DIAG:  G35 (ICD-10-CM) - Multiple sclerosis  G82.50 (ICD-10-CM) - Quadriplegia, unspecified    THERAPY DIAG:  Muscle weakness (generalized)  Unsteadiness on feet  Difficulty in walking, not elsewhere classified  Rationale for Evaluation and Treatment: Rehabilitation  SUBJECTIVE:                                                                                                                                                                                             SUBJECTIVE STATEMENT: Pt presents to [facility] in transport chair w/ 2WW for transfers.  She reports no recent falls.  She continues to have pain in RLE. Pt accompanied by: self (husband dropped her off)  PERTINENT HISTORY: MS   PAIN:  Are you having pain? Yes: NPRS scale: 3-4/10 Pain location: bilateral hips esp right anterior groin, low back, and  feet Pain description: sore, achy Aggravating factors: walking - feels like it might give out Relieving factors: rest  PRECAUTIONS: Fall  RED FLAGS: None   WEIGHT BEARING RESTRICTIONS: No  FALLS: Has patient fallen in  last 6 months? No  LIVING ENVIRONMENT: Lives with: lives with their spouse Lives in: House/apartment Stairs: 4 about home Has following equipment at home: Environmental Consultant - 4 wheeled, Shower bench, Grab bars, and modified vehicle with hand controls, scooter  PLOF: Independent with community mobility with device  PATIENT GOALS: improve balance; walk better, be able to stand and cook  OBJECTIVE:  Note: Objective measures were completed at Evaluation unless otherwise noted.  COGNITION: Overall cognitive status: Within functional limits for tasks assessed   SENSATION: impaired  COORDINATION: LE impaired  MUSCLE TONE: LE impaired  MUSCLE LENGTH: hyper knees into ext  DTRs: TBT  POSTURE: anterior pelvic tilt, right pelvic obliquity, weight shift left, and bilateral hyperextension of knees  LOWER EXTREMITY ROM:     Wfl  LOWER EXTREMITY MMT:    MMT Right Eval Left Eval  Hip flexion 3- 3-  Hip extension    Hip abduction    Hip adduction    Hip internal rotation    Hip external rotation    Knee flexion 3 3  Knee extension 3 3  Ankle dorsiflexion 2 3  Ankle plantarflexion    Ankle inversion    Ankle eversion    (Blank rows = not tested)  BED MOBILITY:  indep  TRANSFERS: Sit to stand: Modified independence  Assistive device utilized: Environmental Consultant - 4 wheeled    heavy ue support   STAIRS: 4 steps occasionally used with hand rails indep GAIT: Findings: Gait Characteristics: Right hip hike, genu recurvatum- Right, genu recurvatum- Left, trendelenburg, and trunk flexed, Distance walked: 500 ft, Assistive device utilized:Walker - 4 wheeled, Level of assistance: Modified independence  FUNCTIONAL TESTS:  Timed up and go (TUG): 46.46 2 MWT128 ft using  rollator 4 stage balance: FT close supervision; semi tandem, tandem and SLS unable to complete  PATIENT SURVEYS:  tba                                                                                                                              TREATMENT  Aquatic therapy at [facility] - pool temperature 88 degrees   Patient seen for aquatic therapy today.  Treatment took place in water  3.6-4.8 feet deep depending upon activity.  Patient entered and exited the pool via step to pattern w/ bil UE on single rail at mod I level.   Exercises: -Water  walking warmup 4x18 ft forward > backwards > laterally  Reviewed aquatic HEP: - Heel Toe Raises at Pool Wall 2x10 - Squat  2x10 - Standing Knee Flexion  2x10 alt LE due to muscle fatigue - Standing Hip Circles at [employer]  2x10 each LE - Forward March  4x18 ft - Lunge to Target at [employer]  2x10 alt LE - Plank with Hip Extension at [employer]  x20 alt LE  Patient requires buoyancy of the water  for support for reduced fall risk with gait training and balance exercises with SBA support, verbal cues and  return demonstration. Exercises able to be performed safely in water  without the risk of fall compared to those same exercises performed on land; viscosity of water  needed for resistance for strengthening. Current of water  provides perturbations for challenging static and dynamic balance.   PATIENT EDUCATION: Education details:  Continue pain management techniques at home.  Continue HEP.  Continue Ai Chi, plan for aquatic HEP - reviewed this visit, will print and laminate for pt.  Person educated: Patient Education method: Explanation Education comprehension: verbalized understanding  HOME EXERCISE PROGRAM: Access Code: [email or web address] URL: [email or web address] Date: 02/05/2024 Prepared by: Daved Bull  Exercises - Corner Balance Feet Together With Eyes Open  - 1 x daily - 4 x weekly - 1 sets - 2-3 reps - 30 seconds hold -  Standing Near Stance in [place] with Eyes Closed  - 1 x daily - 4 x weekly - 1 sets - 2-3 reps - 30 seconds hold - Heel Raises with Counter Support  - 1 x daily - 4 x weekly - 1-2 sets - 5-6 reps - Squat with Chair Touch  - 1 x daily - 4 x weekly - 2 sets - 10 reps - Side Stepping with Resistance at Thighs and Counter Support  - 1 x daily - 4 x weekly - 3 sets - 10 reps  AQUATIC HEP: Access Code: [email or web address] URL: [email or web address] Date: 04/21/2024 Prepared by: Daved Bull  Exercises - Heel Toe Raises at [facility]  - 1 x daily - 7 x weekly - 3 sets - 10 reps - Squat  - 1 x daily - 7 x weekly - 3 sets - 10 reps - Side Stepping  - 1 x daily - 7 x weekly - 3 sets - 10 reps - Forward Walking  - 1 x daily - 7 x weekly - 3 sets - 10 reps - Backward Walking  - 1 x daily - 7 x weekly - 3 sets - 10 reps - Standing Knee Flexion  - 1 x daily - 7 x weekly - 3 sets - 10 reps - Standing Hip Circles at [employer]  - 1 x daily - 7 x weekly - 3 sets - 10 reps - Forward March  - 1 x daily - 7 x weekly - 3 sets - 10 reps - Lunge to Target at [employer]  - 1 x daily - 7 x weekly - 3 sets - 10 reps - Plank with Hip Extension at [employer]  - 1 x daily - 7 x weekly - 3 sets - 10 reps - [place] in [place]  with Pool Noodle  - 1 x daily - 7 x weekly - 3 sets - 10 reps - Chair Pose in [place]  with Pool Noodle  - 1 x daily - 7 x weekly - 3 sets - 10 reps - [place] Pose in [place]  with Pool Noodle  - 1 x daily - 7 x weekly - 3 sets - 10 reps   GOALS: Goals reviewed with patient? Yes  SHORT TERM GOALS: Target date: 02/21/24  Pt will tolerate full aquatic sessions consistently without increase in pain and with improving function to demonstrate good toleration and effectiveness of intervention.  Baseline:  tolerating well (12/8) Goal status: MET    LONG TERM GOALS: Target date: 03/18/25  Pt will be indep with final Aquatic HEP for continued management of  condition Baseline: In progress (1/8) Goal status: IN PROGRESS  2.  Pt will improve to  at least 173ft  to demonstrate improved ability to ambulate into and out of MD offices/clinics. (MCD = 2ft) Baseline: 128 ft; unable to assess (1/8) Goal status: NOT MET  3.  Pt will improve on Tug test to <or=  40s to demonstrate improvement in lower extremity function, mobility and decreased fall risk. Baseline: 46.46; unable to assess (1/8) Goal status: NOT MET  4.  Pt will report improved walking ability and balance with daily function. Baseline: pain impacted tolerance and function (1/8) Goal status: NOT MET  5.  Pt will demonstrate mindfulness with posture for improved alignment Baseline: Pt is mindful of posture but has difficulty due to pain currently (1/8) Goal status: PARTIALLY MET  GOALS (at 1/8 re-cert): Goals reviewed with patient? Yes  SHORT TERM GOALS: Target date: 04/22/2024  Pt will demonstrate improved ind w/ ai chi postures and initial aquatic HEP.  Baseline:  Ai chi provided, HEP to be provided (1/8) Goal status: INITIAL  LONG TERM GOALS: Target date: 05/13/2024  Pt will be indep with final Aquatic HEP for continued management of condition Baseline: In progress (1/8) Goal status: IN PROGRESS  2.  Pt will trial TPDN for pain management to improve posture and functional capacity. Baseline: will reschedule missed appt (1/8) Goal status: INITIAL  ASSESSMENT:  CLINICAL IMPRESSION: Focus of skilled aquatic session today on establishing aquatic HEP and initiating review.  Will complete review of assigned tasks next visit and provide copy to patient prior to discharge.  She is still in agreement to plan to discharge from this setting as weather will be warming up and she will have less difficulty returning to pool environment independently.  Will continue per POC.  OBJECTIVE IMPAIRMENTS: Abnormal gait, decreased activity tolerance, decreased balance, decreased  coordination, decreased endurance, decreased mobility, difficulty walking, decreased strength, impaired tone, and postural dysfunction.   ACTIVITY LIMITATIONS: carrying, lifting, bending, sitting, standing, squatting, stairs, transfers, and locomotion level  PARTICIPATION LIMITATIONS: meal prep, cleaning, laundry, shopping, community activity, yard work, and cooking  PERSONAL FACTORS: Time since onset of injury/illness/exacerbation are also affecting patient's functional outcome.   REHAB POTENTIAL: Fair -good for stated goals  CLINICAL DECISION MAKING: Stable/uncomplicated  EVALUATION COMPLEXITY: Low  PLAN:  PT FREQUENCY: 1-2x/week  PT DURATION: 6 weeks  PLANNED INTERVENTIONS: 97164- PT Re-evaluation, 97750- Physical Performance Testing, 97110-Therapeutic exercises, 97530- Therapeutic activity, [id number]- Neuromuscular re-education, 97535- Self Care, [id number]- Manual therapy, [phone]- Gait training, [phone]- Aquatic Therapy, [phone]- Ionotophoresis 4mg /ml Dexamethasone , 79439 (1-2 muscles), [id number] (3+ muscles)- Dry Needling, Patient/Family education, Balance training, Stair training, Taping, Joint mobilization, DME instructions, Cryotherapy, and Moist heat  PLAN FOR NEXT SESSION: aquatics: strengthening; balance retraining  Aquatic HEP - Planning to D/C from pool at last appt 2/12  Daved Bull, PT, DPT           "

## 2024-04-28 ENCOUNTER — Encounter: Payer: Self-pay | Admitting: Physical Therapy

## 2024-04-28 ENCOUNTER — Ambulatory Visit: Admitting: Physical Therapy

## 2024-04-28 DIAGNOSIS — M6281 Muscle weakness (generalized): Secondary | ICD-10-CM

## 2024-04-28 DIAGNOSIS — R262 Difficulty in walking, not elsewhere classified: Secondary | ICD-10-CM

## 2024-04-28 DIAGNOSIS — R2681 Unsteadiness on feet: Secondary | ICD-10-CM

## 2024-04-28 NOTE — Therapy (Signed)
 " OUTPATIENT PHYSICAL THERAPY NEURO TREATMENT   Patient Name: Kayla [name] MRN: [id number] DOB:1950-04-28, 74 y.o., female Today's Date: 04/28/2024   PCP: Debby FREDRIK Molt MD REFERRING PROVIDER: Murl Cough, MD   END OF SESSION:  PT End of Session - 04/28/24 1202     Visit Number 17    Number of Visits 25   13 + 12   Date for Recertification  05/27/24   pushed out due to potential scheduling changes   Authorization Type mcr    Progress Note Due on Visit 10    PT Start Time 1153   pt arrived late   PT Stop Time 1240    PT Time Calculation (min) 47 min    Equipment Utilized During Treatment Other (comment)   aquatic devices as needed for safety and challenge   Activity Tolerance Patient tolerated treatment well    Behavior During Therapy [facility] for tasks assessed/performed          Past Medical History:  Diagnosis Date   Arthritis    oa   Asthma    Bilateral vocal cord paralysis    Bronchiectasis (HCC)    Cervical cancer (HCC) 2005   Depression    GERD (gastroesophageal reflux disease)    Hiatal hernia    Hypothyroidism    Immunodeficiency    Gets IVIG infusions   Insomnia    Multiple sclerosis 1996   Osteoporosis    Pneumonia 2024, 2014, 1992   Urine retention    Past Surgical History:  Procedure Laterality Date   ABDOMINAL HYSTERECTOMY  03/25/2003   for cervical cancer, complete   BRONCHIAL BIOPSY  10/26/2020   Procedure: BRONCHIAL BIOPSIES;  Surgeon: Brenna Adine CROME, DO;  Location: [facility];  Service: Pulmonary;;   BRONCHIAL BRUSHINGS  10/26/2020   Procedure: BRONCHIAL BRUSHINGS;  Surgeon: Brenna Adine CROME, DO;  Location: [facility];  Service: Pulmonary;;   BRONCHIAL WASHINGS  10/26/2020   Procedure: BRONCHIAL WASHINGS;  Surgeon: Brenna Adine CROME, DO;  Location: [facility];  Service: Pulmonary;;   BRONCHIAL WASHINGS  08/19/2022   Procedure: BRONCHIAL WASHINGS;  Surgeon: Brenna Adine CROME, DO;  Location: [facility];  Service: Cardiopulmonary;;    CATARACT EXTRACTION, BILATERAL Bilateral    lens for cataracts   CONVERSION TO TOTAL HIP Right 09/18/2015   Procedure: CONVERSION OF RIGHT HEMI ARTHROPLASTY TO TOTAL HIP ARTHROPLASTY ACETABULAR REVISON ;  Surgeon: Cough Car, MD;  Location: [facility];  Service: Orthopedics;  Laterality: Right;   HIP ARTHROPLASTY Right 03/01/2013   Procedure: ARTHROPLASTY BIPOLAR HIP;  Surgeon: Cough JONETTA Car, MD;  Location: [facility];  Service: Orthopedics;  Laterality: Right;   HIP CLOSED REDUCTION Right 03/12/2013   Procedure: CLOSED REDUCTION HIP;  Surgeon: Dempsey LULLA Moan, MD;  Location: [facility];  Service: Orthopedics;  Laterality: Right;   HIP CLOSED REDUCTION Right 03/19/2013   Procedure: CLOSED REDUCTION HIP;  Surgeon: Reyes JAYSON Billing, MD;  Location: [facility];  Service: Orthopedics;  Laterality: Right;   HIP CLOSED REDUCTION Right 11/10/2015   Procedure: CLOSED MANIPULATION HIP;  Surgeon: Redell Shoals, MD;  Location: [facility];  Service: Orthopedics;  Laterality: Right;   HIP CLOSED REDUCTION Right 06/05/2017   Procedure: CLOSED REDUCTION HIP;  Surgeon: Heide Ingle, MD;  Location: [facility];  Service: Orthopedics;  Laterality: Right;   SMALL INTESTINE SURGERY     TEE WITHOUT CARDIOVERSION N/A 09/22/2022   Procedure: TRANSESOPHAGEAL ECHOCARDIOGRAM;  Surgeon: Okey Vina LULLA, MD;  Location: [facility];  Service:  Cardiovascular;  Laterality: N/A;   TOTAL HIP REVISION Right 06/06/2017   Procedure: TOTAL HIP REVISION;  Surgeon: Heide Ingle, MD;  Location: [facility];  Service: Orthopedics;  Laterality: Right;   VESICOVAGINAL FISTULA CLOSURE W/ TAH  03/25/2003   VIDEO BRONCHOSCOPY N/A 10/26/2020   Procedure: VIDEO BRONCHOSCOPY WITH FLUORO;  Surgeon: Brenna Adine CROME, DO;  Location: [facility];  Service: Pulmonary;  Laterality: N/A;   VIDEO BRONCHOSCOPY Left 08/19/2022   Procedure: VIDEO BRONCHOSCOPY WITH FLUORO; WITH BAL;  Surgeon: Brenna Adine CROME, DO;  Location: [facility];  Service: Cardiopulmonary;   Laterality: Left;   Patient Active Problem List   Diagnosis Date Noted   Abnormal EKG 04/11/2024   Mild protein malnutrition 10/18/2022   Chronic respiratory failure with hypoxia (HCC) 09/29/2022   Hypogammaglobulinemia 09/22/2022   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) 07/08/2022   GERD (gastroesophageal reflux disease) 06/20/2022   Pulmonary mycobacterial infection (HCC) 09/24/2021   Bilateral vocal cord paralysis 09/24/2021   UTI (urinary tract infection) 05/24/2021   COPD (chronic obstructive pulmonary disease) (HCC)    Iron deficiency anemia secondary to inadequate dietary iron intake 12/20/2020   Purple toe syndrome of both feet (HCC) 07/26/2020   Hypotensive episode 06/07/2017   Cough variant asthma vs vcd  related to MS  11/10/2016   Positional sleep apnea 09/10/2016   Periodic limb movements of sleep 09/10/2016   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/21/2013   Constipation 02/26/2013   Hyponatremia 05/20/2012   Post-menopausal osteoporosis 09/17/2007   Hypothyroidism 04/01/2007   Multiple sclerosis 04/01/2007     ONSET DATE: chronic  REFERRING DIAG:  G35 (ICD-10-CM) - Multiple sclerosis  G82.50 (ICD-10-CM) - Quadriplegia, unspecified    THERAPY DIAG:  Muscle weakness (generalized)  Unsteadiness on feet  Difficulty in walking, not elsewhere classified  Rationale for Evaluation and Treatment: Rehabilitation  SUBJECTIVE:                                                                                                                                                                                             SUBJECTIVE STATEMENT: Pt presents to [facility] in transport chair w/ 2WW for transfers.  She reports no recent falls.  She denies pain today.  Husband inquires about pain management options.  Pt reports she has had less pain this week and wonders if TPDN would help again. Pt accompanied by: self (husband dropped her off)  PERTINENT HISTORY:  MS   PAIN:  Are you having pain? Yes: NPRS scale: 0/10 Pain location: bilateral hips esp right anterior groin, low back, and feet Pain description: sore, achy Aggravating factors: walking - feels  like it might give out Relieving factors: rest  PRECAUTIONS: Fall  RED FLAGS: None   WEIGHT BEARING RESTRICTIONS: No  FALLS: Has patient fallen in last 6 months? No  LIVING ENVIRONMENT: Lives with: lives with their spouse Lives in: House/apartment Stairs: 4 about home Has following equipment at home: Environmental Consultant - 4 wheeled, Shower bench, Grab bars, and modified vehicle with hand controls, scooter  PLOF: Independent with community mobility with device  PATIENT GOALS: improve balance; walk better, be able to stand and cook  OBJECTIVE:  Note: Objective measures were completed at Evaluation unless otherwise noted.  COGNITION: Overall cognitive status: Within functional limits for tasks assessed   SENSATION: impaired  COORDINATION: LE impaired  MUSCLE TONE: LE impaired  MUSCLE LENGTH: hyper knees into ext  DTRs: TBT  POSTURE: anterior pelvic tilt, right pelvic obliquity, weight shift left, and bilateral hyperextension of knees  LOWER EXTREMITY ROM:     Wfl  LOWER EXTREMITY MMT:    MMT Right Eval Left Eval  Hip flexion 3- 3-  Hip extension    Hip abduction    Hip adduction    Hip internal rotation    Hip external rotation    Knee flexion 3 3  Knee extension 3 3  Ankle dorsiflexion 2 3  Ankle plantarflexion    Ankle inversion    Ankle eversion    (Blank rows = not tested)  BED MOBILITY:  indep  TRANSFERS: Sit to stand: Modified independence  Assistive device utilized: Environmental Consultant - 4 wheeled    heavy ue support   STAIRS: 4 steps occasionally used with hand rails indep GAIT: Findings: Gait Characteristics: Right hip hike, genu recurvatum- Right, genu recurvatum- Left, trendelenburg, and trunk flexed, Distance walked: 500 ft, Assistive device utilized:Walker - 4  wheeled, Level of assistance: Modified independence  FUNCTIONAL TESTS:  Timed up and go (TUG): 46.46 2 MWT128 ft using rollator 4 stage balance: FT close supervision; semi tandem, tandem and SLS unable to complete  PATIENT SURVEYS:  tba                                                                                                                              TREATMENT  Aquatic therapy at [facility] - pool temperature 90 degrees   Patient seen for aquatic therapy today.  Treatment took place in water  3.6-4.8 feet deep depending upon activity.  Patient entered and exited the pool via step to pattern w/ bil UE on single rail at mod I level.   Exercises: -Water  walking warmup 4x18 ft forward > backwards > laterally unsupported  Reviewed remaining portion of aquatic HEP: - Warrior III in [place]  with Pool Noodle  x10 each LE, 2-3 sec hold cued to increase stance knee bend - Chair Pose in [place]  with Pool Noodle  x12 reps holding 5 sec each - Falling Star Pose in [place]allow Water  with Pool Noodle (used wall for increased stability today)  x10 each LE  holding 3 sec Discussed potential benefit of using ankle wts in water  as pt is having difficulty keeping LLE on ground and sensation makes it difficult for her to feel placement - pt has some of her own and feels they do not typically help so did not use this visit.  -Alt 5 toe taps unsupported, pt has repeated LOB mod I for recovery, x12 each LE -5 Offset squats x10 each LE w/ light BUE support on pool wall -5 Offset static lunges w/ BUE support x10 each LE, used visual cues to improve R knee alignment  Patient requires buoyancy of the water  for support for reduced fall risk with gait training and balance exercises with SBA support, verbal cues and return demonstration. Exercises able to be performed safely in water  without the risk of fall compared to those same exercises performed on land; viscosity of water  needed for resistance  for strengthening. Current of water  provides perturbations for challenging static and dynamic balance.   PATIENT EDUCATION: Education details:  Continue pain management techniques at home.  Continue HEP.  Continue Ai Chi, provided laminated aquatic HEP this visit w/ completed review.  Encouraged pt to keep pain management appt and to inquire about Rx lidocaine  patches for localized pain management as alternative to pills as pt would like to avoid taking more oral medications. Person educated: Patient Education method: Explanation Education comprehension: verbalized understanding  HOME EXERCISE PROGRAM: Access Code: [email or web address] URL: [email or web address] Date: 02/05/2024 Prepared by: Daved Bull  Exercises - Corner Balance Feet Together With Eyes Open  - 1 x daily - 4 x weekly - 1 sets - 2-3 reps - 30 seconds hold - Standing Near Stance in [place] with Eyes Closed  - 1 x daily - 4 x weekly - 1 sets - 2-3 reps - 30 seconds hold - Heel Raises with Counter Support  - 1 x daily - 4 x weekly - 1-2 sets - 5-6 reps - Squat with Chair Touch  - 1 x daily - 4 x weekly - 2 sets - 10 reps - Side Stepping with Resistance at Thighs and Counter Support  - 1 x daily - 4 x weekly - 3 sets - 10 reps  AQUATIC HEP: Access Code: [email or web address] URL: [email or web address] Date: 04/21/2024 Prepared by: Daved Bull  Exercises - Heel Toe Raises at [facility]  - 1 x daily - 7 x weekly - 3 sets - 10 reps - Squat  - 1 x daily - 7 x weekly - 3 sets - 10 reps - Side Stepping  - 1 x daily - 7 x weekly - 3 sets - 10 reps - Forward Walking  - 1 x daily - 7 x weekly - 3 sets - 10 reps - Backward Walking  - 1 x daily - 7 x weekly - 3 sets - 10 reps - Standing Knee Flexion  - 1 x daily - 7 x weekly - 3 sets - 10 reps - Standing Hip Circles at [employer]  - 1 x daily - 7 x weekly - 3 sets - 10 reps - Forward March  - 1 x daily - 7 x weekly - 3 sets - 10 reps - Lunge to Target at [employer]  -  1 x daily - 7 x weekly - 3 sets - 10 reps - Plank with Hip Extension at [employer]  - 1 x daily - 7 x weekly - 3 sets - 10 reps - Warrior III in [place] Water  with [employer]  -  1 x daily - 7 x weekly - 3 sets - 10 reps - Chair Pose in [place]allow Water  with Pool Noodle  - 1 x daily - 7 x weekly - 3 sets - 10 reps - Falling Star Pose in [place]allow Water  with Pool Noodle  - 1 x daily - 7 x weekly - 3 sets - 10 reps   GOALS: Goals reviewed with patient? Yes  SHORT TERM GOALS: Target date: 02/21/24  Pt will tolerate full aquatic sessions consistently without increase in pain and with improving function to demonstrate good toleration and effectiveness of intervention.  Baseline:  tolerating well (12/8) Goal status: MET    LONG TERM GOALS: Target date: 03/18/25  Pt will be indep with final Aquatic HEP for continued management of condition Baseline: In progress (1/8) Goal status: IN PROGRESS  2.  Pt will improve to at least 175ft  to demonstrate improved ability to ambulate into and out of MD offices/clinics. (MCD = 60ft) Baseline: 128 ft; unable to assess (1/8) Goal status: NOT MET  3.  Pt will improve on Tug test to <or=  40s to demonstrate improvement in lower extremity function, mobility and decreased fall risk. Baseline: 46.46; unable to assess (1/8) Goal status: NOT MET  4.  Pt will report improved walking ability and balance with daily function. Baseline: pain impacted tolerance and function (1/8) Goal status: NOT MET  5.  Pt will demonstrate mindfulness with posture for improved alignment Baseline: Pt is mindful of posture but has difficulty due to pain currently (1/8) Goal status: PARTIALLY MET  GOALS (at 1/8 re-cert): Goals reviewed with patient? Yes  SHORT TERM GOALS: Target date: 04/22/2024  Pt will demonstrate improved ind w/ ai chi postures and initial aquatic HEP.  Baseline:  Ai chi provided, HEP to be provided (1/8) Goal status: INITIAL  LONG TERM GOALS: Target  date: 05/13/2024  Pt will be indep with final Aquatic HEP for continued management of condition Baseline: In progress (1/8) Goal status: IN PROGRESS  2.  Pt will trial TPDN for pain management to improve posture and functional capacity. Baseline: will reschedule missed appt (1/8) Goal status: INITIAL  ASSESSMENT:  CLINICAL IMPRESSION: Focus of skilled aquatic session today on completed review and providing laminated copy of aquatic HEP.  Pt may benefit from use of aquatic ankle weights to improve proprioceptive feedback and motor control of LLE, but was pt reports uncertainty of benefits so not trialed this visit.  She may benefit from re-cert for TPDN visit at next scheduled pool appt to round out PT provided pain management options as pt reports feeling this helped some as her pain has been lower this week.  Will continue per POC.  OBJECTIVE IMPAIRMENTS: Abnormal gait, decreased activity tolerance, decreased balance, decreased coordination, decreased endurance, decreased mobility, difficulty walking, decreased strength, impaired tone, and postural dysfunction.   ACTIVITY LIMITATIONS: carrying, lifting, bending, sitting, standing, squatting, stairs, transfers, and locomotion level  PARTICIPATION LIMITATIONS: meal prep, cleaning, laundry, shopping, community activity, yard work, and cooking  PERSONAL FACTORS: Time since onset of injury/illness/exacerbation are also affecting patient's functional outcome.   REHAB POTENTIAL: Fair -good for stated goals  CLINICAL DECISION MAKING: Stable/uncomplicated  EVALUATION COMPLEXITY: Low  PLAN:  PT FREQUENCY: 1-2x/week  PT DURATION: 6 weeks  PLANNED INTERVENTIONS: 97164- PT Re-evaluation, 97750- Physical Performance Testing, 97110-Therapeutic exercises, 97530- Therapeutic activity, [id number]- Neuromuscular re-education, 97535- Self Care, [id number]- Manual therapy, [id number]- Gait training, [phone]- Aquatic Therapy, [phone]- Ionotophoresis 4mg /ml Dexamethasone ,  79439 (1-2 muscles), [id number] (3+  muscles)- Dry Needling, Patient/Family education, Balance training, Stair training, Taping, Joint mobilization, DME instructions, Cryotherapy, and Moist heat  PLAN FOR NEXT SESSION:   ASSESS LTGs - re-cert for single TPDN visit?  Daved Bull, PT, DPT           "

## 2024-05-05 ENCOUNTER — Ambulatory Visit: Admitting: Physical Therapy

## 2024-05-17 ENCOUNTER — Ambulatory Visit

## 2024-07-11 ENCOUNTER — Ambulatory Visit: Admitting: Internal Medicine

## 2024-07-26 ENCOUNTER — Ambulatory Visit: Admitting: Allergy and Immunology

## 2024-09-08 ENCOUNTER — Ambulatory Visit: Admitting: Internal Medicine
